# Patient Record
Sex: Female | Born: 1965
Health system: Southern US, Community
[De-identification: ages and names within clinical notes are randomized; demographics above are authoritative.]

## PROBLEM LIST (undated history)

## (undated) DIAGNOSIS — R109 Unspecified abdominal pain: Secondary | ICD-10-CM

## (undated) DIAGNOSIS — K449 Diaphragmatic hernia without obstruction or gangrene: Secondary | ICD-10-CM

## (undated) DIAGNOSIS — C20 Malignant neoplasm of rectum: Secondary | ICD-10-CM

## (undated) DIAGNOSIS — D509 Iron deficiency anemia, unspecified: Secondary | ICD-10-CM

## (undated) DIAGNOSIS — Z973 Presence of spectacles and contact lenses: Secondary | ICD-10-CM

## (undated) DIAGNOSIS — T8859XA Other complications of anesthesia, initial encounter: Secondary | ICD-10-CM

## (undated) DIAGNOSIS — Z803 Family history of malignant neoplasm of breast: Secondary | ICD-10-CM

## (undated) DIAGNOSIS — Z8489 Family history of other specified conditions: Secondary | ICD-10-CM

## (undated) DIAGNOSIS — E669 Obesity, unspecified: Secondary | ICD-10-CM

## (undated) DIAGNOSIS — Z8719 Personal history of other diseases of the digestive system: Secondary | ICD-10-CM

## (undated) DIAGNOSIS — K529 Noninfective gastroenteritis and colitis, unspecified: Secondary | ICD-10-CM

## (undated) DIAGNOSIS — K589 Irritable bowel syndrome without diarrhea: Secondary | ICD-10-CM

## (undated) DIAGNOSIS — R0600 Dyspnea, unspecified: Secondary | ICD-10-CM

## (undated) DIAGNOSIS — Z8739 Personal history of other diseases of the musculoskeletal system and connective tissue: Secondary | ICD-10-CM

## (undated) DIAGNOSIS — Z923 Personal history of irradiation: Secondary | ICD-10-CM

## (undated) DIAGNOSIS — N824 Other female intestinal-genital tract fistulae: Secondary | ICD-10-CM

## (undated) DIAGNOSIS — Z9221 Personal history of antineoplastic chemotherapy: Secondary | ICD-10-CM

## (undated) DIAGNOSIS — Z808 Family history of malignant neoplasm of other organs or systems: Secondary | ICD-10-CM

## (undated) DIAGNOSIS — G620 Drug-induced polyneuropathy: Secondary | ICD-10-CM

## (undated) DIAGNOSIS — G4730 Sleep apnea, unspecified: Secondary | ICD-10-CM

## (undated) DIAGNOSIS — Z9889 Other specified postprocedural states: Secondary | ICD-10-CM

## (undated) DIAGNOSIS — Z95828 Presence of other vascular implants and grafts: Secondary | ICD-10-CM

## (undated) DIAGNOSIS — F419 Anxiety disorder, unspecified: Secondary | ICD-10-CM

## (undated) DIAGNOSIS — M1990 Unspecified osteoarthritis, unspecified site: Secondary | ICD-10-CM

## (undated) DIAGNOSIS — E039 Hypothyroidism, unspecified: Secondary | ICD-10-CM

## (undated) DIAGNOSIS — Z8679 Personal history of other diseases of the circulatory system: Secondary | ICD-10-CM

## (undated) DIAGNOSIS — M75102 Unspecified rotator cuff tear or rupture of left shoulder, not specified as traumatic: Secondary | ICD-10-CM

## (undated) DIAGNOSIS — R7303 Prediabetes: Secondary | ICD-10-CM

## (undated) DIAGNOSIS — F32A Depression, unspecified: Secondary | ICD-10-CM

## (undated) DIAGNOSIS — E7800 Pure hypercholesterolemia, unspecified: Secondary | ICD-10-CM

## (undated) DIAGNOSIS — T451X5A Adverse effect of antineoplastic and immunosuppressive drugs, initial encounter: Secondary | ICD-10-CM

## (undated) DIAGNOSIS — Z800 Family history of malignant neoplasm of digestive organs: Secondary | ICD-10-CM

## (undated) DIAGNOSIS — I10 Essential (primary) hypertension: Secondary | ICD-10-CM

## (undated) DIAGNOSIS — T4145XA Adverse effect of unspecified anesthetic, initial encounter: Secondary | ICD-10-CM

## (undated) DIAGNOSIS — E559 Vitamin D deficiency, unspecified: Secondary | ICD-10-CM

## (undated) DIAGNOSIS — F329 Major depressive disorder, single episode, unspecified: Secondary | ICD-10-CM

## (undated) DIAGNOSIS — K219 Gastro-esophageal reflux disease without esophagitis: Secondary | ICD-10-CM

## (undated) DIAGNOSIS — Z87898 Personal history of other specified conditions: Secondary | ICD-10-CM

## (undated) DIAGNOSIS — R112 Nausea with vomiting, unspecified: Secondary | ICD-10-CM

## (undated) DIAGNOSIS — Z933 Colostomy status: Secondary | ICD-10-CM

## (undated) DIAGNOSIS — N189 Chronic kidney disease, unspecified: Secondary | ICD-10-CM

## (undated) DIAGNOSIS — K9189 Other postprocedural complications and disorders of digestive system: Secondary | ICD-10-CM

## (undated) HISTORY — DX: Family history of malignant neoplasm of other organs or systems: Z80.8

## (undated) HISTORY — PX: TONSILLECTOMY: SUR1361

## (undated) HISTORY — DX: Family history of malignant neoplasm of breast: Z80.3

## (undated) HISTORY — PX: DIAGNOSTIC LAPAROSCOPY: SUR761

## (undated) HISTORY — DX: Depression, unspecified: F32.A

## (undated) HISTORY — DX: Essential (primary) hypertension: I10

## (undated) HISTORY — PX: KNEE ARTHROSCOPY: SUR90

## (undated) HISTORY — DX: Vitamin D deficiency, unspecified: E55.9

## (undated) HISTORY — DX: Irritable bowel syndrome, unspecified: K58.9

## (undated) HISTORY — PX: COMBINED HYSTEROSCOPY DIAGNOSTIC / D&C: SUR297

## (undated) HISTORY — DX: Sleep apnea, unspecified: G47.30

## (undated) HISTORY — DX: Family history of malignant neoplasm of digestive organs: Z80.0

## (undated) HISTORY — DX: Pure hypercholesterolemia, unspecified: E78.00

## (undated) HISTORY — DX: Obesity, unspecified: E66.9

## (undated) HISTORY — DX: Major depressive disorder, single episode, unspecified: F32.9

---

## 1994-07-15 HISTORY — PX: ABDOMINAL HYSTERECTOMY: SHX81

## 1998-03-31 ENCOUNTER — Ambulatory Visit (HOSPITAL_COMMUNITY): Admission: RE | Admit: 1998-03-31 | Discharge: 1998-03-31 | Payer: Self-pay | Admitting: *Deleted

## 1998-03-31 ENCOUNTER — Encounter: Payer: Self-pay | Admitting: *Deleted

## 1998-06-30 ENCOUNTER — Ambulatory Visit (HOSPITAL_COMMUNITY): Admission: RE | Admit: 1998-06-30 | Discharge: 1998-06-30 | Payer: Self-pay | Admitting: Gastroenterology

## 2002-05-17 ENCOUNTER — Encounter: Payer: Self-pay | Admitting: *Deleted

## 2002-05-17 ENCOUNTER — Ambulatory Visit (HOSPITAL_COMMUNITY): Admission: RE | Admit: 2002-05-17 | Discharge: 2002-05-17 | Payer: Self-pay | Admitting: *Deleted

## 2002-09-21 ENCOUNTER — Other Ambulatory Visit: Admission: RE | Admit: 2002-09-21 | Discharge: 2002-09-21 | Payer: Self-pay | Admitting: Obstetrics and Gynecology

## 2004-07-15 HISTORY — PX: ROTATOR CUFF REPAIR: SHX139

## 2004-07-30 ENCOUNTER — Encounter: Admission: RE | Admit: 2004-07-30 | Discharge: 2004-07-30 | Payer: Self-pay | Admitting: Chiropractic Medicine

## 2005-06-20 ENCOUNTER — Other Ambulatory Visit: Admission: RE | Admit: 2005-06-20 | Discharge: 2005-06-20 | Payer: Self-pay | Admitting: Obstetrics and Gynecology

## 2009-08-28 ENCOUNTER — Encounter: Admission: RE | Admit: 2009-08-28 | Discharge: 2009-08-28 | Payer: Self-pay | Admitting: Sports Medicine

## 2009-09-14 ENCOUNTER — Ambulatory Visit (HOSPITAL_BASED_OUTPATIENT_CLINIC_OR_DEPARTMENT_OTHER): Admission: RE | Admit: 2009-09-14 | Discharge: 2009-09-14 | Payer: Self-pay | Admitting: Orthopedic Surgery

## 2009-09-14 HISTORY — PX: KNEE ARTHROSCOPY W/ MENISCECTOMY: SHX1879

## 2010-10-08 LAB — CBC
HCT: 36.4 % (ref 36.0–46.0)
Hemoglobin: 12.3 g/dL (ref 12.0–15.0)
MCHC: 33.7 g/dL (ref 30.0–36.0)
MCV: 86.7 fL (ref 78.0–100.0)
Platelets: 216 10*3/uL (ref 150–400)
RBC: 4.2 MIL/uL (ref 3.87–5.11)
RDW: 13.5 % (ref 11.5–15.5)
WBC: 9.9 10*3/uL (ref 4.0–10.5)

## 2010-10-08 LAB — BASIC METABOLIC PANEL
BUN: 9 mg/dL (ref 6–23)
CO2: 28 mEq/L (ref 19–32)
Calcium: 9.3 mg/dL (ref 8.4–10.5)
Chloride: 101 mEq/L (ref 96–112)
Creatinine, Ser: 0.65 mg/dL (ref 0.4–1.2)
GFR calc Af Amer: >60 mL/min (ref >60)
GFR calc non Af Amer: >60 mL/min (ref >60)
Glucose, Bld: 95 mg/dL (ref 70–99)
Potassium: 4.1 mEq/L (ref 3.5–5.1)
Sodium: 137 mEq/L (ref 135–145)

## 2012-02-28 ENCOUNTER — Other Ambulatory Visit: Payer: Self-pay | Admitting: Orthopedic Surgery

## 2012-02-28 DIAGNOSIS — M545 Low back pain, unspecified: Secondary | ICD-10-CM

## 2012-03-02 ENCOUNTER — Inpatient Hospital Stay: Admission: RE | Admit: 2012-03-02 | Payer: Self-pay | Source: Ambulatory Visit

## 2012-03-06 ENCOUNTER — Ambulatory Visit
Admission: RE | Admit: 2012-03-06 | Discharge: 2012-03-06 | Disposition: A | Discharge status: Home / Self Care | Payer: BC Managed Care – PPO | Source: Ambulatory Visit | Attending: Orthopedic Surgery | Admitting: Orthopedic Surgery

## 2012-03-06 DIAGNOSIS — M545 Low back pain, unspecified: Secondary | ICD-10-CM

## 2013-02-12 ENCOUNTER — Other Ambulatory Visit: Payer: Self-pay | Admitting: Family Medicine

## 2013-02-12 ENCOUNTER — Ambulatory Visit
Admission: RE | Admit: 2013-02-12 | Discharge: 2013-02-12 | Disposition: A | Discharge status: Home / Self Care | Payer: BC Managed Care – PPO | Source: Ambulatory Visit | Attending: Family Medicine | Admitting: Family Medicine

## 2013-02-12 DIAGNOSIS — R52 Pain, unspecified: Secondary | ICD-10-CM

## 2013-09-06 ENCOUNTER — Other Ambulatory Visit: Payer: Self-pay | Admitting: Gastroenterology

## 2013-09-06 DIAGNOSIS — R110 Nausea: Secondary | ICD-10-CM

## 2013-09-17 ENCOUNTER — Ambulatory Visit
Admission: RE | Admit: 2013-09-17 | Discharge: 2013-09-17 | Disposition: A | Discharge status: Home / Self Care | Payer: BC Managed Care – PPO | Source: Ambulatory Visit | Attending: Gastroenterology | Admitting: Gastroenterology

## 2013-09-17 DIAGNOSIS — R110 Nausea: Secondary | ICD-10-CM

## 2013-09-28 ENCOUNTER — Other Ambulatory Visit: Payer: Self-pay | Admitting: Gastroenterology

## 2013-09-28 DIAGNOSIS — R1011 Right upper quadrant pain: Secondary | ICD-10-CM

## 2013-10-06 ENCOUNTER — Ambulatory Visit
Admission: RE | Admit: 2013-10-06 | Discharge: 2013-10-06 | Disposition: A | Discharge status: Home / Self Care | Payer: BC Managed Care – PPO | Source: Ambulatory Visit | Attending: Gastroenterology | Admitting: Gastroenterology

## 2013-10-06 DIAGNOSIS — R1011 Right upper quadrant pain: Secondary | ICD-10-CM

## 2013-10-06 MED ORDER — GADOBENATE DIMEGLUMINE 529 MG/ML IV SOLN
20.0000 mL | Freq: Once | INTRAVENOUS | Status: AC | PRN
  Administered 2013-10-06: 20 mL via INTRAVENOUS

## 2013-10-08 ENCOUNTER — Other Ambulatory Visit (HOSPITAL_COMMUNITY): Payer: Self-pay | Admitting: Gastroenterology

## 2013-10-08 DIAGNOSIS — R110 Nausea: Secondary | ICD-10-CM

## 2013-10-15 ENCOUNTER — Ambulatory Visit (HOSPITAL_COMMUNITY): Payer: BC Managed Care – PPO

## 2013-10-25 ENCOUNTER — Ambulatory Visit (HOSPITAL_COMMUNITY): Payer: BC Managed Care – PPO

## 2013-11-16 ENCOUNTER — Encounter: Payer: Self-pay | Admitting: Cardiology

## 2014-01-05 ENCOUNTER — Ambulatory Visit: Payer: BC Managed Care – PPO | Admitting: Cardiology

## 2014-01-12 ENCOUNTER — Ambulatory Visit: Payer: BC Managed Care – PPO | Admitting: Cardiology

## 2014-01-19 ENCOUNTER — Ambulatory Visit: Payer: BC Managed Care – PPO | Admitting: Cardiology

## 2014-02-02 ENCOUNTER — Encounter: Payer: Self-pay | Admitting: *Deleted

## 2014-02-21 ENCOUNTER — Encounter: Payer: Self-pay | Admitting: Cardiology

## 2014-02-21 ENCOUNTER — Ambulatory Visit (INDEPENDENT_AMBULATORY_CARE_PROVIDER_SITE_OTHER): Payer: BC Managed Care – PPO | Admitting: Cardiology

## 2014-02-21 VITALS — BP 139/88 | HR 68 | Ht 70.5 in | Wt 359.0 lb

## 2014-02-21 DIAGNOSIS — E7800 Pure hypercholesterolemia, unspecified: Secondary | ICD-10-CM

## 2014-02-21 DIAGNOSIS — Z8249 Family history of ischemic heart disease and other diseases of the circulatory system: Secondary | ICD-10-CM

## 2014-02-21 DIAGNOSIS — E66813 Obesity, class 3: Secondary | ICD-10-CM | POA: Insufficient documentation

## 2014-02-21 NOTE — Progress Notes (Signed)
[place]. [address], [address], [place]  [address] Phone: [phone] Fax:  [phone]  Date:  02/21/2014   ID:  Lindsay Little, DOB 1965/08/20, MRN [id number]  PCP:  Vidal Schwalbe, MD   History of Present Illness: Lindsay Little is a 48 y.o. 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 Leonia Reeves. in regards to heart disease, her father died at age 34 because of coronary artery disease likely secondary to familial hyperlipidemia.  Her mother also had a 95% lesion that was stented by Dr. Irish Lack. She has 2 children, husband, and she is concerned about her overall cardiovascular health.  Studies:  ETT 03/17/13 - 5 minutes, sinus tachycardia at rest. No ST segment changes. No chest pain, rare PVC. Blood pressure 156/96 at rest, 220/78 at stress. Impression: Overall low risk with no electrocardiographic evidence of ischemia however poor exercise tolerance noted as well as hypertension.   Records show a former echocardiogram in 2003 showing normal function. Lots of stress at home and at work, perimenopausal. 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 She works side-by-side with Dr. Maxwell Caul.  She used to work out 3 times a week with a partner at church but recently hurt her left knee and she has not been going. She has lost as much as 70 pounds in the past but has gained back approximately 30. She is taking Cymbalta for perimenopausal symptoms. No SOB.   Dizzy when standing has improved. She has been on Bystolic 5 mg for quite some time and is also taking furosemide. Last 6 months worse.     Wt Readings from Last 3 Encounters:  02/21/14 359 lb (162.841 kg)     Past Medical History  Diagnosis Date  . Hypertension   . Hypercholesteremia   .  Obesity   . Tear of left rotator cuff   . IBS (irritable bowel syndrome)   . Vertigo   . TMJ (dislocation of temporomandibular joint)   . Bulging disc     L5  . Mild sleep apnea   . Depression   . Rectal bleeding   . Vitamin D deficiency   . Osteoarthritis, knee     Past Surgical History  Procedure Laterality Date  . Rotator cuff repair      Current Outpatient Prescriptions  Medication Sig Dispense Refill  . belladona alk-PHENObarbital (DONNATAL) 16.2 MG tablet Take 1 tablet by mouth as needed.      . Coenzyme Q10 (COQ-10) 100 MG CAPS Take 100 mg by mouth daily.      . DULoxetine (CYMBALTA) 60 MG capsule Take 60 mg by mouth daily.      Marland Kitchen esomeprazole (NEXIUM) 40 MG capsule Take 40 mg by mouth 2 (two) times daily.      . furosemide (LASIX) 20 MG tablet Take 20 mg by mouth as needed.      Marland Kitchen HYDROcodone-acetaminophen (NORCO/VICODIN) 5-325 MG per tablet Take 1 tablet by mouth as needed for moderate pain.      . hyoscyamine (LEVBID) 0.375 MG 12 hr tablet Take 0.375 mg by mouth 2 (two) times daily.      . meloxicam (MOBIC) 15 MG tablet Take 15 mg by mouth daily.      . methocarbamol (ROBAXIN) 500 MG  tablet Take 500 mg by mouth as needed for muscle spasms.      . nebivolol (BYSTOLIC) 5 MG tablet Take 5 mg by mouth daily.      . rosuvastatin (CRESTOR) 5 MG tablet Take 5 mg by mouth daily.      . Vitamin D, Ergocalciferol, (DRISDOL) 50000 UNITS CAPS capsule Take 50,000 Units by mouth 2 (two) times a week.       No current facility-administered medications for this visit.    Allergies:    Allergies  Allergen Reactions  . Caine-1 [Lidocaine] Rash  . Penicillins Rash  . Sulfa Antibiotics Rash    Rash & Vomiting    Social History:  The patient  reports that she drinks about .6 ounces of alcohol per week.   Family History  Problem Relation Age of Onset  . Coronary artery disease Mother 34  . Hypertension Mother   . Hyperlipidemia Mother   . Diabetes Mellitus I Mother   .  Coronary artery disease Father   . Hyperlipidemia Father   . Hypertension Father   . Cancer Sister     skin - non melanoma  . Hyperlipidemia Brother   . Cancer Maternal Uncle     pancreatic, colo, prostate  . Cirrhosis Maternal Uncle   . Hypertension Maternal Grandmother   . Diabetes Mellitus I Maternal Grandmother   . Hyperlipidemia Maternal Grandmother   . CVA Maternal Grandmother   . Hypertension Maternal Grandfather   . Coronary artery disease Maternal Grandfather 65  . Hyperlipidemia Maternal Grandfather   . Coronary artery disease Paternal Grandmother   . Hypertension Paternal Grandmother   . Hyperlipidemia Paternal Grandmother   . Diabetes Mellitus I Paternal Grandmother   . Hypertension Paternal Grandfather   . Hyperlipidemia Paternal Grandfather   . Coronary artery disease Paternal Grandfather     ROS:  Please see the history of present illness.   Denies any syncope, bleeding, orthopnea, PND   Left knee.34'. All other systems reviewed and negative.   PHYSICAL EXAM: VS:  BP 139/88  Pulse 68  Ht 5' 10.5" (1.791 m)  Wt 359 lb (162.841 kg)  BMI 50.77 kg/m2 Well nourished, well developed, in no acute distress HEENT: normal, [place]/AT, EOMI Neck: no JVD, normal carotid upstroke, no bruit Cardiac:  normal S1, S2; RRR; no murmur Lungs:  clear to auscultation bilaterally, no wheezing, rhonchi or rales Abd: soft, nontender, no hepatomegaly, no bruits Ext: no edema, 2+ distal pulses Skin: warm and dry GU: deferred Neuro: no focal abnormalities noted, AAO x 3  EKG:  02/21/14  -sinus rhythm, 68, no other significant abnormalities. No change prior   Labs: 09/16/13-LDL 146  ASSESSMENT AND PLAN:  1. Family history of early coronary artery disease-excellent use of Crestor, now taking on a daily basis. Previously taking twice a week. I would love LDL to be around 100. Less than 130 would be wonderful. 2. Morbid obesity-continue to encourage weight loss. Her left knee pain has  prohibited this. Her husband, chronic pain, is going to undergo bariatric surgery. Discussed decreasing carbohydrates. Weight loss. 3. Hyperlipidemia-as described above. LDL 146. Crestor. 4. One year followup.  Signed, Candee Furbish, MD [facility]  02/21/2014 9:08 AM

## 2014-02-21 NOTE — Patient Instructions (Signed)
The current medical regimen is effective;  continue present plan and medications.  Follow up in 1 year with Dr Marlou Porch.  You will receive a letter in the mail 2 months before you are due.  Please call us when you receive this letter to schedule your follow up appointment.

## 2014-04-13 ENCOUNTER — Ambulatory Visit: Payer: BC Managed Care – PPO | Admitting: *Deleted

## 2014-04-18 ENCOUNTER — Encounter: Payer: BC Managed Care – PPO | Attending: Family Medicine | Admitting: *Deleted

## 2014-04-18 ENCOUNTER — Encounter: Payer: Self-pay | Admitting: *Deleted

## 2014-04-18 DIAGNOSIS — Z713 Dietary counseling and surveillance: Secondary | ICD-10-CM | POA: Insufficient documentation

## 2014-04-18 DIAGNOSIS — Z6841 Body Mass Index (BMI) 40.0 and over, adult: Secondary | ICD-10-CM | POA: Diagnosis not present

## 2014-04-18 NOTE — Patient Instructions (Signed)
1. 1-2 pounds weight loss per week.  2. Balance meals using the plate method.  3. Keep healthy foods available in the house for easy meal prep (frozen vegetables, lean deli meat, fruit, yogurt). Keep healthy snacks at work.  4. Limit sweetened drinks to 1 per day only on work days. Consider doing half sweet/half unsweet tea and/or using Stevia/Splenda to sweeten.  5. Exercise as physically able to, using FitBit to increase total steps throughout the day

## 2014-04-18 NOTE — Progress Notes (Signed)
Medical Nutrition Therapy:  Appt start time: 0800 end time:  0900.  Assessment:  Patient here today for weight management. Patient has struggled with weight most of her life. She had lost 70 pounds 1.5 years ago by monitoring portion size and exercising (boot camp) most days. After developing knee problems, she has been unable to exercise much. She has since regained most of the weight. She plans on having a knee replacement, but wants to wait as long as possibly due to having to care for disabled husband. Physical activity will be limited until this is resolved. She does have a FitBit and averages between 3,000-10,000 depending on the day. Her dietary intake is variable due to time constraints. She eats out most days at lunch, and dinner sometimes consists of high fat meats, starches depending on who is cooking. She also has a high intake of sweetened drinks, consuming 2-3 20 oz sodas/sweet teas daily.   MEDICATIONS: See list   DIETARY INTAKE:   Usual eating pattern includes 3 meals and 1 snacks per day.  24-hr recall:  B (9 AM): Sometimes skips/eats late, egg salad/banana sandwich OR oatmeal, water/soda Snk ( AM): Non  L ( PM): Salad OR [employer] (sandwich, fries), OR [place] food (fajitas, tortilla chips)sweet tea Snk ( PM): Chex mix, Cheez its, popcorn, small chocolate bar D ( PM): Hamburger/hot dog, fries/tater tots/mashed potatoes/corn, broccoli/green beans/peas/Brussels sprouts, sugar-free Kool-aid Snk ( PM): None Beverages: Water, Up to 2 regular 20 oz sodas, sweet tea (20 oz), sugar-free Kool-aid  Usual physical activity: Limited, 3,000-10,000 steps daily  Estimated energy needs: 1500 calories 188 g carbohydrates 94 g protein 42 g fat  Progress Towards Goal(s):  In progress.   Nutritional Diagnosis:  [place]-3.3 Overweight/obesity As related to excessive intake of sweetened drinks and physical inactivity.  As evidenced by BMI >30.    Intervention:  Nutrition counseling. We discussed  strategies for weight loss, including balancing nutrients (carbs, protein, fat), portion control, healthy snacks, and exercise. We also discussed realistic weight loss expectations and strategies for achieving and maintaining long-term weight loss.   Goals:  1. 1-2 pounds weight loss per week.  2. Balance meals using the plate method.  3. Keep healthy foods available in the house for easy meal prep (frozen vegetables, lean deli meat, fruit, yogurt). Keep healthy snacks at work.  4. Limit sweetened drinks to 1 per day only on work days. Consider doing half sweet/half unsweet tea and/or using Stevia/Splenda to sweeten.  5. Exercise as physically able to, using FitBit to increase total steps throughout the day  Handouts given during visit include:  Weight loss tips  Meal plan card  Monitoring/Evaluation:  Dietary intake, exercise, and body weight prn.

## 2014-12-06 ENCOUNTER — Other Ambulatory Visit (HOSPITAL_COMMUNITY): Payer: Self-pay | Admitting: Gastroenterology

## 2014-12-06 DIAGNOSIS — R109 Unspecified abdominal pain: Secondary | ICD-10-CM

## 2014-12-15 ENCOUNTER — Ambulatory Visit (HOSPITAL_COMMUNITY)
Admission: RE | Admit: 2014-12-15 | Discharge: 2014-12-15 | Disposition: A | Discharge status: Home / Self Care | Payer: BLUE CROSS/BLUE SHIELD | Source: Ambulatory Visit | Attending: Gastroenterology | Admitting: Gastroenterology

## 2014-12-15 DIAGNOSIS — E669 Obesity, unspecified: Secondary | ICD-10-CM | POA: Insufficient documentation

## 2014-12-15 DIAGNOSIS — E780 Pure hypercholesterolemia: Secondary | ICD-10-CM | POA: Diagnosis not present

## 2014-12-15 DIAGNOSIS — K589 Irritable bowel syndrome without diarrhea: Secondary | ICD-10-CM | POA: Insufficient documentation

## 2014-12-15 DIAGNOSIS — R109 Unspecified abdominal pain: Secondary | ICD-10-CM | POA: Diagnosis not present

## 2014-12-15 MED ORDER — TECHNETIUM TC 99M MEBROFENIN IV KIT
5.0000 | PACK | Freq: Once | INTRAVENOUS | Status: AC | PRN
  Administered 2014-12-15: 5 via INTRAVENOUS

## 2014-12-15 MED ORDER — SINCALIDE 5 MCG IJ SOLR
INTRAMUSCULAR | Status: AC
  Administered 2014-12-15: 7.06 ug
  Filled 2014-12-15: qty 20

## 2014-12-15 MED ORDER — STERILE WATER FOR INJECTION IJ SOLN
INTRAMUSCULAR | Status: AC
  Filled 2014-12-15: qty 20

## 2015-01-13 ENCOUNTER — Other Ambulatory Visit: Payer: Self-pay | Admitting: Gastroenterology

## 2015-01-17 ENCOUNTER — Other Ambulatory Visit: Payer: Self-pay | Admitting: Gastroenterology

## 2015-01-17 DIAGNOSIS — R1084 Generalized abdominal pain: Secondary | ICD-10-CM

## 2015-01-18 ENCOUNTER — Ambulatory Visit (HOSPITAL_COMMUNITY)
Admission: RE | Admit: 2015-01-18 | Discharge: 2015-01-18 | Disposition: A | Discharge status: Home / Self Care | Payer: BLUE CROSS/BLUE SHIELD | Source: Ambulatory Visit | Attending: Gastroenterology | Admitting: Gastroenterology

## 2015-01-18 ENCOUNTER — Encounter (HOSPITAL_COMMUNITY): Payer: Self-pay | Admitting: *Deleted

## 2015-01-18 ENCOUNTER — Encounter (HOSPITAL_COMMUNITY)
Admission: RE | Disposition: A | Discharge status: Home / Self Care | Payer: Self-pay | Source: Ambulatory Visit | Attending: Gastroenterology

## 2015-01-18 DIAGNOSIS — C19 Malignant neoplasm of rectosigmoid junction: Secondary | ICD-10-CM | POA: Insufficient documentation

## 2015-01-18 DIAGNOSIS — C20 Malignant neoplasm of rectum: Secondary | ICD-10-CM | POA: Diagnosis present

## 2015-01-18 HISTORY — PX: EUS: SHX5427

## 2015-01-18 SURGERY — ULTRASOUND, LOWER GI TRACT, ENDOSCOPIC
Anesthesia: Moderate Sedation

## 2015-01-18 MED ORDER — FENTANYL CITRATE (PF) 100 MCG/2ML IJ SOLN
INTRAMUSCULAR | Status: AC
  Filled 2015-01-18: qty 2

## 2015-01-18 MED ORDER — MIDAZOLAM HCL 10 MG/2ML IJ SOLN
INTRAMUSCULAR | Status: DC | PRN
Start: 2015-01-18 — End: 2015-01-18
  Administered 2015-01-18 (×2): 2 mg via INTRAVENOUS

## 2015-01-18 MED ORDER — FENTANYL CITRATE (PF) 100 MCG/2ML IJ SOLN
INTRAMUSCULAR | Status: DC | PRN
  Administered 2015-01-18 (×2): 25 ug via INTRAVENOUS

## 2015-01-18 MED ORDER — MIDAZOLAM HCL 5 MG/ML IJ SOLN
INTRAMUSCULAR | Status: AC
  Filled 2015-01-18: qty 2

## 2015-01-18 MED ORDER — DIPHENHYDRAMINE HCL 50 MG/ML IJ SOLN
INTRAMUSCULAR | Status: AC
  Filled 2015-01-18: qty 1

## 2015-01-18 NOTE — Op Note (Signed)
[facility] [place], [address]   OPERATIVE PROCEDURE REPORT  PATIENT: Lindsay Little, Lindsay Little  MR#: [id number] BIRTHDATE: 12/11/1965  GENDER: female ENDOSCOPIST: Arta Silence, MD REFERRED BY:  Teena Irani, M.D. PROCEDURE DATE:  01/18/2015 PROCEDURE:   Flexible sigmoidoscopy EUS ASA CLASS:   Class III INDICATIONS:1.  rectal cancer. MEDICATIONS: Fentanyl 50 mcg IV and Versed 4 mg IV  DESCRIPTION OF PROCEDURE:   After the risks benefits and alternatives of the procedure were thoroughly explained, informed consent was obtained.  Throughout the procedure, the patients blood pressure, pulse and oxygen saturations were monitored continuously. Under direct visualization, the radial forward-viewing  echoendoscope was introduced through the anus  and advanced to the sigmoid colon .  Water was used as necessary to provide an acoustic interface.  Imaging was obtained at 7.5 and 12Mhz. Upon completion of the imaging, water was removed and the patient was sent to the recovery room in satisfactory condition. Estimated blood loss is zero unless otherwise noted in this procedure report.    FINDINGS:   Normal digital rectal exam; could not palpate mass.  In proximal rectum extending to the distal rectum, from 12 cm to 20 cm from the anal verge, a partial circumferential (50-75% circumferential distally, 75-100% circumferential proximall) mass was seen.  Mass was firm, fixed, ulcerated, friable.  EUS radial scope was able to traverse the tumor into normal-appearing distal sigmoid colon.  Lesion invaded through the wall of the rectum into neighboring connective tissues in multiple regions.  There were several round hypoechoic well-defined malignant-appearing lymph nodes in the vicinity of the proximal portion of the tumor.  STAGING: T3 N2 Mx via rectal ultrasound  ENDOSCOPIC IMPRESSION: As above.  Rectosigmoid mass, biopsy-proven adenocarcinoma, with local  invasion noted under ultrasound.  RECOMMENDATIONS: 1.  Watch for potential complications of procedure. 2.  Needs CT chest, abdomen, pelvis to complete staging. 3.  Based already on rectal ultrasound results, patient would not be candidate for upfront surgery.  She will need surgical and radiation oncology consultations. 4.  Will discuss with Dr. Amedeo Plenty.   _______________________________ Lorrin MaisArta Silence, MD 01/18/2015 9:34 AM   CC:

## 2015-01-18 NOTE — H&P (Signed)
Patient interval history reviewed.  Patient examined again.  There has been no change from documented H/P dated 01/13/15 (scanned into chart from our office) except as documented above.  Assessment:  1.  Hematochezia. 2.  Rectal mass.  Plan:  1.  Endorectal ultrasound for locoregional staging. 2.  Risks (bleeding, infection, bowel perforation that could require surgery, sedation-related changes in cardiopulmonary systems), benefits (identification and possible treatment of source of symptoms, exclusion of certain causes of symptoms), and alternatives (watchful waiting, radiographic imaging studies, empiric medical treatment) of sigmoidoscopy with ultrasound (endorectal ultrasound = RUS) were explained to patient/family in detail and patient wishes to proceed.

## 2015-01-18 NOTE — Discharge Instructions (Signed)
Endorectal ultrasound  Post procedure instructions:  Read the instructions outlined below and refer to this sheet in the next few weeks. These discharge instructions provide you with general information on caring for yourself after you leave the hospital. Your doctor may also give you specific instructions. While your treatment has been planned according to the most current medical practices available, unavoidable complications occasionally occur. If you have any problems or questions after discharge, call Dr. Paulita Fujita at [facility] [phone]).  HOME CARE INSTRUCTIONS  ACTIVITY:  You may resume your regular activity, but move at a slower pace for the next 24 hours.   Take frequent rest periods for the next 24 hours.   Walking will help get rid of the air and reduce the bloated feeling in your belly (abdomen).   No driving for 24 hours (because of the medicine (anesthesia) used during the test).   You may shower.   Do not sign any important legal documents or operate any machinery for 24 hours (because of the anesthesia used during the test).  NUTRITION:  Drink plenty of fluids.   You may resume your normal diet as instructed by your doctor.   Begin with a light meal and progress to your normal diet. Heavy or fried foods are harder to digest and may make you feel sick to your stomach (nauseated).   Avoid alcoholic beverages for 24 hours or as instructed.  MEDICATIONS:  You may resume your normal medications unless your doctor tells you otherwise.  WHAT TO EXPECT TODAY:  Some feelings of bloating in the abdomen.   Passage of more gas than usual.   Spotting of blood in your stool or on the toilet paper.  IF YOU HAD POLYPS REMOVED DURING THE COLONOSCOPY:  No aspirin products for 7 days or as instructed.   No alcohol for 7 days or as instructed.   Eat a soft diet for the next 24 hours.   FINDING OUT THE RESULTS OF YOUR TEST  Not all test results are available  during your visit. If your test results are not back during the visit, make an appointment with your caregiver to find out the results. Do not assume everything is normal if you have not heard from your caregiver or the medical facility. It is important for you to follow up on all of your test results.     SEEK IMMEDIATE MEDICAL CARE IF:   You have more than a spotting of blood in your stool.   Your belly is swollen (abdominal distention).   You are nauseated or vomiting.   You have a fever.   You have abdominal pain or discomfort that is severe or gets worse throughout the day.    Document Released: 02/13/2004 Document Revised: 03/13/2011 Document Reviewed: 02/11/2008 [facility] Patient Information 2012 [place].

## 2015-01-18 NOTE — Addendum Note (Signed)
Addended by: Arta Silence on: 01/18/2015 08:41 AM   Modules accepted: Orders

## 2015-01-19 ENCOUNTER — Encounter (HOSPITAL_COMMUNITY): Payer: Self-pay | Admitting: Gastroenterology

## 2015-01-20 ENCOUNTER — Other Ambulatory Visit: Payer: BLUE CROSS/BLUE SHIELD

## 2015-01-20 ENCOUNTER — Ambulatory Visit
Admission: RE | Admit: 2015-01-20 | Discharge: 2015-01-20 | Disposition: A | Discharge status: Home / Self Care | Payer: BLUE CROSS/BLUE SHIELD | Source: Ambulatory Visit | Attending: Gastroenterology | Admitting: Gastroenterology

## 2015-01-20 DIAGNOSIS — R1084 Generalized abdominal pain: Secondary | ICD-10-CM

## 2015-01-20 MED ORDER — IOPAMIDOL (ISOVUE-300) INJECTION 61%
125.0000 mL | Freq: Once | INTRAVENOUS | Status: AC | PRN
  Administered 2015-01-20: 125 mL via INTRAVENOUS

## 2015-01-23 ENCOUNTER — Telehealth: Payer: Self-pay | Admitting: Hematology

## 2015-01-23 NOTE — Telephone Encounter (Signed)
New patient appt-s/w patient and gave np appt for 07/19 @ 10:30 w/Dr. Burr Medico

## 2015-01-25 ENCOUNTER — Other Ambulatory Visit: Payer: Self-pay | Admitting: Gastroenterology

## 2015-01-25 DIAGNOSIS — D1800 Hemangioma unspecified site: Secondary | ICD-10-CM

## 2015-01-31 ENCOUNTER — Encounter: Payer: Self-pay | Admitting: Hematology

## 2015-01-31 ENCOUNTER — Ambulatory Visit: Payer: BLUE CROSS/BLUE SHIELD

## 2015-01-31 ENCOUNTER — Telehealth: Payer: Self-pay | Admitting: Hematology

## 2015-01-31 ENCOUNTER — Ambulatory Visit (HOSPITAL_BASED_OUTPATIENT_CLINIC_OR_DEPARTMENT_OTHER): Payer: BLUE CROSS/BLUE SHIELD | Admitting: Hematology

## 2015-01-31 VITALS — BP 143/79 | HR 68 | Temp 98.1°F | Resp 21 | Ht 71.0 in | Wt 346.8 lb

## 2015-01-31 DIAGNOSIS — D609 Acquired pure red cell aplasia, unspecified: Secondary | ICD-10-CM | POA: Diagnosis not present

## 2015-01-31 DIAGNOSIS — K769 Liver disease, unspecified: Secondary | ICD-10-CM

## 2015-01-31 DIAGNOSIS — C20 Malignant neoplasm of rectum: Secondary | ICD-10-CM

## 2015-01-31 DIAGNOSIS — R197 Diarrhea, unspecified: Secondary | ICD-10-CM

## 2015-01-31 NOTE — Progress Notes (Signed)
Checked in new pt with no financial concerns prior to seeing the dr.  Pt has my card for any billing questions, concerns or if financial assistance is needed.  ° °

## 2015-01-31 NOTE — Telephone Encounter (Signed)
per pof to sch pt appt-gav pt copy of avs-avd rad on/[facility] wiill call to sch scan/radaition appts

## 2015-01-31 NOTE — Progress Notes (Signed)
[place]  [phone] Fax:([phone]) [place] Note   Patient Care Team: [name], MD as PCP - General (Family Medicine) 01/31/2015  CHIEF COMPLAINTS/PURPOSE OF CONSULTATION:  Newly diagnosed rectal cancer   HISTORY OF PRESENTING ILLNESS:  Lindsay Little 49 y.o. female is here because of evaluation for management for newly diagnosed rectal adenocarcinoma.   On 09/06/13, she presented to Dr. Starr Sinclair [facility]] with 20-monthhistory of diarrhea and worsening abdominal pain. She has a history of chronic abdominal pain and had been [place] Dr. HAmedeo Plenty Abdominal [place]orea3/6/15 noted to have some hypoechoicity in the left hepatic lobe which was confirmed on MRI to be two hemangiomas  in the right hepatic lobe of measuring up to 3.0 cm along with an additional 7 mm probable cyst.   She presented with worsening of diarrhea for 6 month and was reevaluated by Dr. HAmedeo Plenty She was seen on 12/06/14 at which time it was decided she would undergo HIDA scan given that the pain was intermittently in the RUQ in addition to upper and lower endoscopy. HIDA scan 12/15/14 was reassuring, but she was found to have a malignant tumor of the rectum about 12-18 cm proximal to the anus, gastritis, gastric polyps on endoscopies 01/13/15. Biopsies for these three specimens along with the duodenum were collected and resulted on 01/17/15 for invasive adenocarcinoma, chronic gastritis [H. Pylori negative], fundic gland polyps, and non-specific mild patchy intra-epithelial lymphocytes. Endoscopic UKorea7/6/16 staged the mass at T3N2Mx and she is here today for consideration of neoadjuvant therapy.    Today, she does report abdominal pain but is no different than her baseline [right-sided, upper, lower] but has had worsening appetite over the last 2 months. She does have diarrhea with bowel  movements every 1-2 hours, especially after eating, and has been noticing bleeding with her bowel movement following her endoscopic evaluation. She also feels her energy level has decreased. Otherwise, she denies nausea, chest pain, shortness of breath.    Family history is notable for a maternal uncle who had pancreatic cancer diagnosed in his mid to late 668sthat spread to the colon and prostate before he died about 147years later and a sister with non-melanoma skin cancer. 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 She smoked roughly 20 pack years [1 pack/week x 7 years] in the past and reports occasional alcohol use but denies any prior illicit drug use.  She lost about 7 lbs in the past 2 months  MEDICAL HISTORY:  Past Medical History  Diagnosis Date  . Hypertension   . Hypercholesteremia   . Obesity   . Tear of left rotator cuff   . IBS (irritable bowel syndrome)   . Vertigo   . TMJ (dislocation of temporomandibular joint)   . Bulging disc     L5  . Mild sleep apnea   . Depression   . Rectal bleeding   . Vitamin D deficiency   . Osteoarthritis, knee     SURGICAL HISTORY: Past Surgical History  Procedure Laterality Date  . Rotator cuff repair    . Eus N/A 01/18/2015    Procedure: LOWER ENDOSCOPIC ULTRASOUND (EUS);  Surgeon: WArta Silence MD;  [facility];  Service: Endoscopy;  Laterality: N/A;  . Abdominal hysterectomy  1996    Removed  uterus, and tubes    SOCIAL HISTORY: History   Social History  . Marital Status: Married    Spouse Name: N/A  . Number of Children: N/A  . Years of Education: N/A   Occupational History  . Not on file.   Social History Main Topics  . Smoking status: Former Research scientist (life sciences)  . Smokeless tobacco: Not on file     Comment: 1 pack/week intermittently x 7 years  . Alcohol Use: 0.6 oz/week    1 Shots of liquor per week  . Drug Use: Not on file  . Sexual Activity: Not on file     Other Topics Concern  . Not on file   Social History Narrative   Tobacco Use: Cigarettes - Former Smoker   Alcohol: Yes, very rare, liquor.    No recreational drug use   Occupation: Head CMA @ [employer]   Marital Status: Married    Husband: Roselyn Reef Disabled   Children: 2 adopted kids [place]   Religion: [employer] [place] HISTORY: Family History  Problem Relation Age of Onset  . Coronary artery disease Mother 87  . Hypertension Mother   . Hyperlipidemia Mother   . Diabetes Mellitus I Mother   . Coronary artery disease Father   . Hyperlipidemia Father   . Hypertension Father   . Cancer Sister     skin - non melanoma  . Hyperlipidemia Brother   . Cancer Maternal Uncle 61    pancreatic with mets to colon and prostate  . Cirrhosis Maternal Uncle   . Hypertension Maternal Grandmother   . Diabetes Mellitus I Maternal Grandmother   . Hyperlipidemia Maternal Grandmother   . CVA Maternal Grandmother   . Hypertension Maternal Grandfather   . Coronary artery disease Maternal Grandfather 65  . Hyperlipidemia Maternal Grandfather   . Coronary artery disease Paternal Grandmother   . Hypertension Paternal Grandmother   . Hyperlipidemia Paternal Grandmother   . Diabetes Mellitus I Paternal Grandmother   . Hypertension Paternal Grandfather   . Hyperlipidemia Paternal Grandfather   . Coronary artery disease Paternal Grandfather     ALLERGIES:  is allergic to caine-1; penicillins; and sulfa antibiotics.  MEDICATIONS:  Current Outpatient Prescriptions  Medication Sig Dispense Refill  . belladona alk-PHENObarbital (DONNATAL) 16.2 MG tablet Take 1 tablet by mouth as needed.    . Coenzyme Q10 (COQ-10) 100 MG CAPS Take 100 mg by mouth daily.    . DULoxetine (CYMBALTA) 60 MG capsule Take 60 mg by mouth daily.    Marland Kitchen esomeprazole (NEXIUM) 40 MG capsule Take 40 mg by mouth 2 (two) times daily.    . furosemide (LASIX) 20 MG tablet Take  20 mg by mouth as needed.    . hydrochlorothiazide (HYDRODIURIL) 25 MG tablet Take 25 mg by mouth daily.    Marland Kitchen HYDROcodone-acetaminophen (NORCO/VICODIN) 5-325 MG per tablet Take 1 tablet by mouth as needed for moderate pain.    . hyoscyamine (LEVBID) 0.375 MG 12 hr tablet Take 0.375 mg by mouth 2 (two) times daily.    . meloxicam (MOBIC) 15 MG tablet Take 15 mg by mouth daily.    . methocarbamol (ROBAXIN) 500 MG tablet Take 500 mg by mouth as needed for muscle spasms.    . nebivolol (BYSTOLIC) 5 MG tablet Take 5 mg by mouth daily.    . rosuvastatin (CRESTOR) 5 MG tablet Take 5  mg by mouth daily.    . Vitamin D, Ergocalciferol, (DRISDOL) 50000 UNITS CAPS capsule Take 50,000 Units by mouth 2 (two) times a week.     No current facility-administered medications for this visit.    REVIEW OF SYSTEMS:   Constitutional: Denies fevers, chills or abnormal night sweats Eyes: Denies blurriness of vision, double vision or watery eyes Ears, nose, mouth, throat, and face: Denies mucositis or sore throat Respiratory: Denies cough, dyspnea or wheezes Cardiovascular: Denies palpitation, chest discomfort or lower extremity swelling Gastrointestinal:  Diarrhea, abdominal pain, hematochezia Skin: Denies abnormal skin rashes Lymphatics: Denies new lymphadenopathy or easy bruising Neurological:Denies numbness, tingling or new weaknesses Behavioral/Psych: Mood is stable, no new changes  All other systems were reviewed with the patient and are negative.  PHYSICAL EXAMINATION: ECOG PERFORMANCE STATUS: 1 - Symptomatic but completely ambulatory  Filed Vitals:   01/31/15 1106  BP: 143/79  Pulse: 68  Temp: 98.1 F (36.7 C)  Resp: 21   Filed Weights   01/31/15 1106  Weight: 346 lb 12.8 oz (157.307 kg)    GENERAL:alert, no distress and comfortable SKIN: skin color, texture, turgor are normal, no rashes or significant lesions EYES: normal, conjunctiva are pink and non-injected, sclera clear OROPHARYNX:no  exudate, no erythema and lips, buccal mucosa, and tongue normal  NECK: supple, thyroid normal size, non-tender, without nodularity LYMPH:  no palpable lymphadenopathy in the cervical, axillary or inguinal LUNGS: clear to auscultation and percussion with normal breathing effort HEART: regular rate & rhythm and no murmurs and no lower extremity edema ABDOMEN:abdomen soft, normal bowel sounds, mildly tender to the RUQ with deep palpation. Pt declined rectal exam.  Musculoskeletal:no cyanosis of digits and no clubbing  PSYCH: alert & oriented x 3 with fluent speech NEURO: no focal motor/sensory deficits  LABORATORY DATA:  I have reviewed the data as listed Lab Results  Component Value Date   WBC 9.9 09/12/2009   HGB 12.3 09/12/2009   HCT 36.4 09/12/2009   MCV 86.7 09/12/2009   PLT 216 09/12/2009   No results for input(s): NA, K, CL, CO2, GLUCOSE, BUN, CREATININE, CALCIUM, GFRNONAA, GFRAA, PROT, ALBUMIN, AST, ALT, ALKPHOS, BILITOT, BILIDIR, IBILI in the last 8760 hours.  Diagnosis 01/13/2015 1. Duodenum, Biopsy - MILD PATCHY INCREASE IN INTRAEPITHELIAL LYMPHOCYTES, SEE COMMENT. - NO DYSPLASIA OR MALIGNANCY. 2. Stomach, polyp(s), gastric - FUNDIC GLAND POLYPS. - NO INTESTINAL METAPLASIA, DYSPLASIA, OR MALIGNANCY. 3. Stomach, biopsy, gastric antrum - CHRONIC GASTRITIS. - NEGATIVE FOR HELICOBACTER PYLORI. - NO INTESTINAL METAPLASIA, DYSPLASIA, OR MALIGNANCY. 4. Rectum, biopsy, rectal mass - INVASIVE ADENOCARCINOMA. Microscopic Comment 1. There is a patchy mild increase in intraepithelial lymphocytes, including at the tips of villi. However, there is no significant villous blunting. This pattern is non-specific and can be seen in celiac disease, NSAID injury, H. pylori infection, etc. 3. A Warthin-Starry stain is performed to determine the possibility of the presence of Helicobacter pylori. The Warthin-Starry stain is negative for organisms of Helicobacter pylori. 4. Dr. Saralyn Pilar has  reviewed the case. The case was called to Dr. Amedeo Plenty' office on 01/17/2015.   RADIOGRAPHIC STUDIES: I have personally reviewed the radiological images as listed and agreed with the findings in the report.  Ct Abdomen Pelvis W Contrast 01/20/2015    IMPRESSION: Right lateral rectal wall exophytic mass likely corresponding to the known neoplasm.  Two ill-defined hepatic hypo enhancing lesions which appears to correspond to the lesions seen on the prior MRI. Please note evaluation of the liver is very limited due to  streak artifact. If there is clinical concern for new metastatic disease MRI may provide better evaluation.  Small left ovarian cystic lesion corresponding to the lesion reported on the prior pelvic ultrasound. Ultrasound may provide better evaluation of the pelvic structures.   Electronically Signed   By: Anner Crete M.D.   On: 01/20/2015 16:15   Lower endoscopic ultrasound by Dr. Paulita Fujita 01/18/2015 FINDINGS: Normal digital rectal exam; could not palpate mass. In proximal rectum extending to the distal rectum, from 12 cm to 20 cm from the anal verge, a partial circumferential (50-75% circumferential distally, 75-100% circumferential proximall) mass was seen. Mass was firm, fixed, ulcerated, friable. EUS radial scope was able to traverse the tumor into normal-appearing distal sigmoid colon. Lesion invaded through the wall of the rectum into neighboring connective tissues in multiple regions. There were several round hypoechoic well-defined malignant-appearing lymph nodes in the vicinity of the proximal portion of the tumor. STAGING: T3 N2 Mx via rectal ultrasound ENDOSCOPIC IMPRESSION: As above. Rectosigmoid mass, biopsy-proven adenocarcinoma, with local invasion noted under ultrasound. RECOMMENDATIONS: 1. Watch for potential complications of procedure. 2. Needs CT chest, abdomen, pelvis to complete staging. 3. Based already on rectal ultrasound results, patient would not be candidate for  upfront surgery. She will need surgical and radiation oncology consultations. 4. Will discuss with Dr. Amedeo Plenty.   ASSESSMENT & PLAN: Ms. Hartsough is a 49 year old female with chronic abdominal pain with rectal mass on colonoscopy found to be invasive rectal adenocarcinoma.   1. T3N2Mx, at least Stage IIIB/C rectal adenocarcinoma -We reviewed with the patient her biopsy and imaging results along with records sent to [place] from her GI doctor. We explained to her the significance of her tumor markers, CEA and CA 19-9, and their low levels as favorable.  -To determine the extent of disease, we recommended a follow-up chest CT. Her GI doctor has also scheduled an abdominal MRI to further evaluate her liver lesions.  -If her CT chest and liver MRI is negative or metastatic disease, she has stage IIIc disease. Given the locally advanced tumor, we recommended undergoing neoadjuvant therapy with either 5-FU or capecitabine followed by surgical management and adjuvant chemotherapy.  -Will refer her to rad/onc  -We will present her case in GI tumor board.   -We discussed with the risks associated with chemotherapy, including but not limited to anorexia, fatigue, weight loss, dizziness, hair loss, neuropathy. -will check her tumor for MMR  2. Microcytic anemia: Likely in the setting of ongoing blood loss from her malignancy. Hb 11.7, MCV 80.0 when last checked on 01/17/15, down from Hb 13.1, MCV 86.5 on 07/27/12.  -Check labs at the time of follow-up  3. Diarrhea: Likely related to her cancer.  -We recommended Imodium and watching her volume status as it could contribute to her feelings of low energy.  Plan: -Refer to radiation oncology -Recommended Imodium for control of diarrhea -Recommended electrolyte solution for rehydration -Schedule follow-up for chemo class -Check CBC and iron studies -Follow-up chest CT, abdominal MRI -RTC late next week to finalize her treatment plan    Orders Placed This  Encounter  Procedures  . CT Chest W Contrast    Standing Status: Future     Number of Occurrences:      Standing Expiration Date: 01/31/2016    Order Specific Question:  Reason for Exam (SYMPTOM  OR DIAGNOSIS REQUIRED)    Answer:  rule out metastasis    Order Specific Question:  Is the patient pregnant?  Answer:  No    Order Specific Question:  Preferred imaging location?    Answer:  [facility]  . Ambulatory referral to Radiation Oncology    Referral Priority:  Routine    Referral Type:  Consultation    Referral Reason:  Specialty Services Required    Requested Specialty:  Radiation Oncology    Number of Visits Requested:  [place], [facility]  01/31/2015 5:09 PM  I have seen the patient, examined her. I agree with the assessment and and plan and have edited the notes.   I spent 60 mins for the consultation, >50% face to face discussion.   Truitt Merle  01/31/2015

## 2015-01-31 NOTE — [facility] Oncology Navigator Note (Signed)
Oncology Nurse Navigator Documentation  Oncology Nurse Navigator Flowsheets 01/31/2015  Referral date to RadOnc/MedOnc 01/20/2015  Navigator Encounter Type Initial MedOnc  Patient Visit Type Medonc  Barriers/Navigation Needs Education-  Education Pain/ Symptom Management  Interventions Education Method  Education Method Verbal;Written;Teach-back  Support Groups/Services GI;ACS  Time Spent with Patient 15  Met with patient and husband, Roselyn Reef during new patient visit. Explained the role of the GI Nurse Navigator and provided New Patient Packet with information on: 1. Colorectal cancer 2. Support groups 3. Advanced Directives 4. Fall Safety Plan Answered questions, reviewed current treatment plan using TEACH back and provided emotional support. She agrees to contact with ACS for Patient Manager to be sent. No transportation or financial issues at this time. She works as patient Government social research officer for [employer] and much of her work can be done from home if necessary. Briefly reviewed Xeloda and RT and how they work together as well as skin care to rectal area. She reports she has been on a "blog" and is aware of how bad the skin breakdown can be. She is anxious but knows she must do this for herself and her family. Children are age 23 (both adopted) and the boy has cerebral palsy. Husband is a stay at home dad due to medical disability.  Merceda Elks, RN, BSN GI Oncology [place]

## 2015-02-03 ENCOUNTER — Telehealth: Payer: Self-pay | Admitting: *Deleted

## 2015-02-03 NOTE — Progress Notes (Addendum)
GI Location of Tumor / Histology: Rectal Cancer  Lindsay Little presented  months ago with symptoms of:  67month diarrhea and chronic  abdominal pain ,   Biopsies of  (if applicable) revealed:Diagnosis 01/13/15: 1. Duodenum, Biopsy - MILD PATCHY INCREASE IN INTRAEPITHELIAL LYMPHOCYTES, SEE COMMENT.- NO DYSPLASIA OR MALIGNANCY. 2. Stomach, polyp(s), gastric - FUNDIC GLAND POLYPS.- NO INTESTINAL METAPLASIA, DYSPLASIA, OR MALIGNANCY. 3. Stomach, biopsy, gastric antrum - CHRONIC GASTRITIS.- NEGATIVE FOR HELICOBACTER PYLORI. - NO INTESTINAL METAPLASIA, DYSPLASIA, OR MALIGNANCY. 4. Rectum, biopsy, rectal mass - INVASIVE ADENOCARCINOMA.  Past/Anticipated interventions by surgeon, if any:  Past/Anticipated interventions by medical oncology, if any: Dr. FBurr Medico7/19./16   Weight changes, if any: 7 lb wt.loss past month    Bowel/Bladder complaints, if any: diarrhea every 1-2 hours after eating, IBS, taking imodium  As of yesterday, has had 4 loose stools today has taken  2 so far today, yesterday 15-20 loose stools, eating low fiber diet, Nausea / Vomiting, if any: No  Pain issues, if any:  Bottom mild /2/3 now  Any blood per rectum:  Bleeding with bowel movements  following EUS, bleeding stopped last week,   SAFETY ISSUES:  Prior radiation? NO  Pacemaker/ICD? NO  Possible current pregnancy?  NO  Is the patient on methotrexate? NO  Married,  2 children,  Depression,  smoker for  20 years(1p week for 7 years),occasional alcohol use, no illicit drug use,  ,Maternal Uncle pancreatic cancer mid-late 60's spread to prostate and colon died 152years later, sister with non-melanoma skin cancer, back,   BP 123/78 mmHg  Pulse 71  Temp(Src) 98 F (36.7 C) (Oral)  Resp 20  Ht 5' 11"  (1.803 m)  Wt 347 lb 9.6 oz (157.67 kg)  BMI 48.50 kg/m2  Wt Readings from Last 3 Encounters:  02/06/15 347 lb 9.6 oz (157.67 kg)  01/31/15 346 lb 12.8 oz (157.307 kg)  01/18/15 342 lb (155.13 kg)

## 2015-02-03 NOTE — Telephone Encounter (Signed)
Pt called asking for copay amt of chemo stating that she hasn't heard from anyone & she is trying to make some decisions.  Reviewed chart & per Dr Ernestina Penna note, pt is to return next week to finalize plans.  She discussed xeloda vs 5FU but I don't see any doses.  Message left with Raquel/Managed Care to see if she can find out anything.  Pt states Dr Burr Medico discussed xeloda bid 5 days/wk x 5 wks.  Pt reports [facility]. Will discuss with Dr Burr Medico on Monday.

## 2015-02-05 ENCOUNTER — Ambulatory Visit
Admission: RE | Admit: 2015-02-05 | Discharge: 2015-02-05 | Disposition: A | Discharge status: Home / Self Care | Payer: BLUE CROSS/BLUE SHIELD | Source: Ambulatory Visit | Attending: Gastroenterology | Admitting: Gastroenterology

## 2015-02-05 DIAGNOSIS — D1800 Hemangioma unspecified site: Secondary | ICD-10-CM

## 2015-02-05 MED ORDER — GADOXETATE DISODIUM 0.25 MMOL/ML IV SOLN
13.0000 mL | Freq: Once | INTRAVENOUS | Status: AC | PRN
  Administered 2015-02-05: 13 mL via INTRAVENOUS

## 2015-02-06 ENCOUNTER — Ambulatory Visit
Admission: RE | Admit: 2015-02-06 | Discharge: 2015-02-06 | Disposition: A | Discharge status: Home / Self Care | Payer: BLUE CROSS/BLUE SHIELD | Source: Ambulatory Visit | Attending: Radiation Oncology | Admitting: Radiation Oncology

## 2015-02-06 ENCOUNTER — Telehealth: Payer: Self-pay | Admitting: *Deleted

## 2015-02-06 ENCOUNTER — Encounter: Payer: Self-pay | Admitting: Radiation Oncology

## 2015-02-06 VITALS — BP 123/78 | HR 71 | Temp 98.0°F | Resp 20 | Ht 71.0 in | Wt 347.6 lb

## 2015-02-06 DIAGNOSIS — K921 Melena: Secondary | ICD-10-CM | POA: Diagnosis not present

## 2015-02-06 DIAGNOSIS — R110 Nausea: Secondary | ICD-10-CM | POA: Insufficient documentation

## 2015-02-06 DIAGNOSIS — R140 Abdominal distension (gaseous): Secondary | ICD-10-CM | POA: Insufficient documentation

## 2015-02-06 DIAGNOSIS — Z79899 Other long term (current) drug therapy: Secondary | ICD-10-CM | POA: Diagnosis not present

## 2015-02-06 DIAGNOSIS — R197 Diarrhea, unspecified: Secondary | ICD-10-CM | POA: Insufficient documentation

## 2015-02-06 DIAGNOSIS — Z510 Encounter for antineoplastic radiation therapy: Secondary | ICD-10-CM | POA: Diagnosis not present

## 2015-02-06 DIAGNOSIS — Z880 Allergy status to penicillin: Secondary | ICD-10-CM | POA: Diagnosis not present

## 2015-02-06 DIAGNOSIS — C20 Malignant neoplasm of rectum: Secondary | ICD-10-CM | POA: Insufficient documentation

## 2015-02-06 DIAGNOSIS — Z882 Allergy status to sulfonamides status: Secondary | ICD-10-CM | POA: Insufficient documentation

## 2015-02-06 NOTE — Telephone Encounter (Signed)
Called pt after speaking with Brian/Pharmacist/[facility] who states pt's co-pay for xeloda 514m x 4 = 2000 mg & xeloda 150 mg x 2 = 300 mg both bid x 30 days would be $150 each.  Co-pay could be reduced if able to give one strength.  Dr FBurr Mediconotified & she states she is waiting on CT scan but hopefully would be able to modify this dose with one script. Left message for pt to call back.

## 2015-02-06 NOTE — Progress Notes (Signed)
Please see the Nurse Progress Note in the MD Initial Consult Encounter for this patient. 

## 2015-02-06 NOTE — Progress Notes (Signed)
Radiation Oncology         [phone] ________________________________  Name: Lindsay Little MRN: [id number]  Date: 02/06/2015  DOB: 1966/05/06  WU:[id number],[id number] S, MD  Truitt Merle, MD     REFERRING PHYSICIAN: Truitt Merle, MD   DIAGNOSIS: The encounter diagnosis was Rectal adenocarcinoma.  Rectal adenocarcinoma   Staging form: Colon and Rectum, AJCC 7th Edition     Clinical: T3, N2, M0 - Unsigned  HISTORY OF PRESENT ILLNESS::Lindsay Little is a 49 y.o. female who is seen for an initial consultation visit regarding the patient's diagnosis of rectal cancer. The patient presented with initial symptoms of diarrhea, pelvic pain, abdominal pain. These symptoms began 6 months ago.  Current symptoms include abdominal pain, worsening diarrhea (past 6 months), poor appetite, lost 26 lbs.   On 09/06/13, she presented to Dr. Starr Little [facility]] with 62-monthhistory of diarrhea and worsening abdominal pain. She has a history of chronic abdominal pain and had been previously [place]. HAmedeo Little She presented with worsening of diarrhea for 6 month and was reevaluated by Dr. HAmedeo Little She was seen on 12/06/14 at which time it was decided she would undergo HIDA scan given that the pain was intermittently in the RUQ in addition to upper and lower endoscopy. HIDA scan 12/15/14 was reassuring, but she was found to have a malignant tumor of the rectum about 12-18 cm proximal to the anus, gastritis, gastric polyps on endoscopies 01/13/15.   Endoscopy/ colonoscopy has been performed. A rectal mass was noted. A complete colonoscopy beyond the tumor was able to be performed. Biopsies of anus, gastritis, gastric polyps and duodenum was performed. This returned positive for invasive adenocarcinoma, chronic gastritis [H. Pylori negative], fundic gland polyps, and non-specific mild patchy intra-epithelial lymphocytes.   An  endoscopic ultrasound (01/18/15) has been performed. Based on the this study, the tumor was staged as T3N2. The distance from the anal verge was 12 cm. The length of the tumor was 8 cm. The tumor was described as non-circumferential and friable.   Based on current imaging, a corresponding rectal tumor was seen. Evidence of significant bowel obstruction was not seen. Pelvic/ regional nodes was not seen. Distant metastatic disease was not seen. Abdominal [place] noted to have some hypoechoicity in the left hepatic lobe which was confirmed on MRI to be two hemangiomas in the right hepatic lobe of measuring up to 3.0 cm along with an additional 7 mm probable cyst.  Patient notes that she is scheduled for a chest CT scan tomorrow.   PREVIOUS RADIATION THERAPY: No   PAST MEDICAL HISTORY:  has a past medical history of Hypertension; Hypercholesteremia; Obesity; Tear of left rotator cuff; IBS (irritable bowel syndrome); Vertigo; TMJ (dislocation of temporomandibular joint); Bulging disc; Mild sleep apnea; Depression; Rectal bleeding; Vitamin D deficiency; and Osteoarthritis, knee.     PAST SURGICAL HISTORY: Past Surgical History  Procedure Laterality Date  . Rotator cuff repair    . Eus N/A 01/18/2015    Procedure: LOWER ENDOSCOPIC ULTRASOUND (EUS);  Surgeon: Lindsay Silence MD;  [facility];  Service: Endoscopy;  Laterality: N/A;  . Abdominal hysterectomy  1996    Removed uterus, and tubes     FAMILY HISTORY: family history includes CVA in her maternal grandmother; Cancer in her sister; Cancer (age of onset: 634 in her maternal uncle; Cirrhosis in her maternal uncle; Coronary artery disease in her  father, paternal grandfather, and paternal grandmother; Coronary artery disease (age of onset: 5) in her mother; Coronary artery disease (age of onset: 17) in her maternal grandfather; Diabetes Mellitus I in her maternal grandmother, mother, and paternal grandmother; Hyperlipidemia in her brother,  father, maternal grandfather, maternal grandmother, mother, paternal grandfather, and paternal grandmother; Hypertension in her father, maternal grandfather, maternal grandmother, mother, paternal grandfather, and paternal grandmother.   SOCIAL HISTORY:  reports that she has quit smoking. She does not have any smokeless tobacco history on file. She reports that she drinks about 0.6 oz of alcohol per week.   ALLERGIES: Caine-1; Penicillins; and Sulfa antibiotics   MEDICATIONS:  Current Outpatient Prescriptions  Medication Sig Dispense Refill  . Coenzyme Q10 (COQ-10) 100 MG CAPS Take 100 mg by mouth daily.    . DULoxetine (CYMBALTA) 60 MG capsule Take 60 mg by mouth daily.    Marland Kitchen esomeprazole (NEXIUM) 40 MG capsule Take 40 mg by mouth 2 (two) times daily.    . furosemide (LASIX) 20 MG tablet Take 20 mg by mouth as needed.    . hydrochlorothiazide (HYDRODIURIL) 25 MG tablet Take 25 mg by mouth daily.    Marland Kitchen HYDROcodone-acetaminophen (NORCO/VICODIN) 5-325 MG per tablet Take 1 tablet by mouth as needed for moderate pain.    . hyoscyamine (LEVBID) 0.375 MG 12 hr tablet Take 0.375 mg by mouth 2 (two) times daily.    Marland Kitchen loperamide (IMODIUM) 2 MG capsule Take by mouth as needed for diarrhea or loose stools.    . meloxicam (MOBIC) 15 MG tablet Take 15 mg by mouth daily.    . nebivolol (BYSTOLIC) 5 MG tablet Take 5 mg by mouth daily.    . rosuvastatin (CRESTOR) 5 MG tablet Take 5 mg by mouth daily.    . Vitamin D, Ergocalciferol, (DRISDOL) 50000 UNITS CAPS capsule Take 50,000 Units by mouth 2 (two) times a week.    [place] Rocker alk-PHENObarbital (DONNATAL) 16.2 MG tablet Take 1 tablet by mouth as needed.    . methocarbamol (ROBAXIN) 500 MG tablet Take 500 mg by mouth as needed for muscle spasms.     No current facility-administered medications for this encounter.     REVIEW OF SYSTEMS:  A 15 point review of systems is documented in the electronic medical record. This was obtained by the nursing staff.  However, I reviewed this with the patient to discuss relevant findings and make appropriate changes.  Pertinent items are noted in HPI.    PHYSICAL EXAM:  height is 5' 11"  (1.803 m) and weight is 347 lb 9.6 oz (157.67 kg). Her oral temperature is 98 F (36.7 C). Her blood pressure is 123/78 and her pulse is 71. Her respiration is 20.   ECOG = 1  0 - Asymptomatic (Fully active, able to carry on all predisease activities without restriction)  1 - Symptomatic but completely ambulatory (Restricted in physically strenuous activity but ambulatory and able to carry out work of a light or sedentary nature. For example, light housework, office work)  2 - Symptomatic, <50% in bed during the day (Ambulatory and capable of all self care but unable to carry out any work activities. Up and about more than 50% of waking hours)  3 - Symptomatic, >50% in bed, but not bedbound (Capable of only limited self-care, confined to bed or chair 50% or more of waking hours)  4 - Bedbound (Completely disabled. Cannot carry on any self-care. Totally confined to bed or chair)  5 - Death  Oken MM, Creech RH, Tormey DC, et al. [id number]). "Toxicity and response criteria of the [facility]". [place] Oncol. 5 (6): 649-55  General: Well-developed, in no acute distress HEENT: Normocephalic, atraumatic; oral cavity clear Neck: Supple without any lymphadenopathy Cardiovascular: Regular rate and rhythm Respiratory: Clear to auscultation bilaterally GI: Soft, nontender, normal bowel sounds Extremities: No edema present Neuro: No focal deficits Lymphadenopathy:  Inguinal lymphadenopathy was not identified. Rectal:  Rectal tumor was not palpable. Bleeding was not present.    LABORATORY DATA:  Lab Results  Component Value Date   WBC 9.9 09/12/2009   HGB 12.3 09/12/2009   HCT 36.4 09/12/2009   MCV 86.7 09/12/2009   PLT 216 09/12/2009   Lab Results  Component Value Date   NA 137 09/12/2009    K 4.1 09/12/2009   CL 101 09/12/2009   CO2 28 09/12/2009   No results found for: ALT, AST, GGT, ALKPHOS, BILITOT  CEA: 1.4    RADIOGRAPHY: Mr Abdomen W Wo Contrast  02/06/2015   CLINICAL DATA:  49 year old female with history of Crohn's disease, recently diagnosed with rectal cancer. Abdominal pain and abnormal stools for 6 months.  EXAM: MRI ABDOMEN WITHOUT AND WITH CONTRAST  TECHNIQUE: Multiplanar multisequence MR imaging of the abdomen was performed both before and after the administration of intravenous contrast.  CONTRAST:  13 mL of MultiHance.  COMPARISON:  CT of the abdomen and pelvis 01/20/2015. MRI of the abdomen 10/06/2013.  FINDINGS: Lower chest:  Unremarkable.  Hepatobiliary: In segment 5 of the liver there is a 2.8 x 1.9 cm lesion, and in segment 7 of the liver there is a 1.9 x 1.7 cm lesion. Both of these are slightly T2 hyperintense, T1 hypointense, and demonstrate peripheral nodular enhancement with slight progressive centripetal filling, diagnostic of cavernous hemangiomas. These are very similar to prior study 10/06/2013. There is also a tiny 7 mm lesion between segments 5 and 6 in the liver that measures 7 mm, which is also slightly T1 hypointense and T2 hyperintense, without definite enhancement, either and additional small cavernous hemangioma or small benign lesions such as a cyst (unchanged). No new suspicious appearing hepatic lesions are otherwise noted. No intra or extrahepatic biliary ductal dilatation. In the dependent portion of the gallbladder there is some T1 hyperintense, T2 hypointense material, most compatible with some biliary sludge. Gallbladder is otherwise normal in appearance.  Pancreas: No pancreatic mass. No pancreatic ductal dilatation. No pancreatic or peripancreatic fluid or inflammatory changes.  Spleen: Unremarkable.  Adrenals/Urinary Tract: Bilateral kidneys and bilateral adrenal glands are normal in appearance. No hydroureteronephrosis in the visualized  abdomen.  Stomach/Bowel: Visualized portions of the stomach, small bowel and colon are normal in appearance.  Vascular/Lymphatic: No aneurysm identified in the visualized abdominal vasculature. No lymphadenopathy noted in the abdomen.  Other: No significant volume of ascites in the visualized peritoneal cavity.  Musculoskeletal: No aggressive osseous lesions noted in the visualized portions of the skeleton.  IMPRESSION: 1. Small cavernous hemangiomas in segments 5 and 7 of the liver redemonstrated, as above. Additional 7 mm indeterminate lesion is also favored to represent a tiny cavernous hemangioma, but may alternatively represent a small cyst. No new suspicious appearing hepatic lesions to suggest presence of metastatic disease at this time. 2. Mild hepatic steatosis.   Electronically Signed   By: Vinnie Langton M.D.   On: 02/06/2015 08:26   Ct Abdomen Pelvis W Contrast  01/20/2015   CLINICAL DATA:  49 year old female with recent diagnosis of  breast rectal cancer. Evaluate for dx metastatic disease.  EXAM: CT ABDOMEN AND PELVIS WITH CONTRAST  TECHNIQUE: Multidetector CT imaging of the abdomen and pelvis was performed using the standard protocol following bolus administration of intravenous contrast.  CONTRAST:  139m ISOVUE-300 IOPAMIDOL (ISOVUE-300) INJECTION 61%  COMPARISON:  MRI dated 10/06/2013 and ultrasound dated 09/17/2013  FINDINGS: Visualized lung bases are clear.  No intra-abdominal free air or free fluid.  Evaluation of the liver is limited due to streak artifact. An ill-defined 2.5 x 2.5 cm subcapsular low attenuating/hypo enhancing lesion in the right lobe of the liver inferiorly (series 3, image 34) as well as a 1.7 x 0.8 cm ill-defined hypoenhancing lesion in the right lobe of the liver posteriorly (series 3, image 24) correspond to the lesions go seen on the MRI characterized as a hemangioma. Additional lesions seen on the prior MRI are not visualized on the current study. MRI may provide  better evaluation if there is high clinical suspicion or new concern for metastatic disease. No intrahepatic biliary ductal dilatation. The gallbladder, pancreas, spleen, and adrenal glands appear unremarkable. There is bilateral renal cortical lobularity. No hydronephrosis. The visualized ureters appear unremarkable. The urinary bladder is collapsed. Hysterectomy. A 3.0 x 2.8 cm left ovarian hypodense lesion is not well characterized but may correspond to the cystic lesion described on the prior ultrasound report dated 04/30/2011. Ultrasound may provide better evaluation of the pelvic structures.  A 3.7 x 3.9 cm soft tissue mass is noted abutting the right rectosigmoid likely corresponding to the known rectal cancer. There is no evidence of bowel obstruction or inflammation. The appendix appears unremarkable  The visualized abdominal aorta and IVC appear unremarkable. There is no lymphadenopathy.  Small fat containing umbilical hernia.  Mild degenerative changes of the spine.    No acute fracture.  IMPRESSION: Right lateral rectal wall exophytic mass likely corresponding to the known neoplasm.  Two ill-defined hepatic hypo enhancing lesions which appears to correspond to the lesions seen on the prior MRI. Please note evaluation of the liver is very limited due to streak artifact. If there is clinical concern for new metastatic disease MRI may provide better evaluation.  Small left ovarian cystic lesion corresponding to the lesion reported on the prior pelvic ultrasound. Ultrasound may provide better evaluation of the pelvic structures.   Electronically Signed   By: AAnner CreteM.D.   On: 01/20/2015 16:15       IMPRESSION:    Rectal adenocarcinoma   01/13/2015 Initial Diagnosis Rectal adenocarcinoma   01/13/2015 Procedure Colonoscopy showed a sensitivity O nonobstructing mass in the rectum and from 12-18 cm proximal to the Annis. The mass was circumferential, measuring about 6 cm in length. EGD was  negative.   01/17/2015 Tumor Marker CEA 1.4, CA-19-9 8    01/18/2015 Procedure Lower endoscopic ultrasound by Dr. oPaulita Fujitashowed a T3 N2 rectal mass.   01/20/2015 Imaging CT abdomen and pelvis with contrast showed right lateral rectal wall exophytic mass, and 2 ill-defined hepatic hypoenhancing lesions which appears to correspond to the lesion seen on the prior MRI in 2015.    The patient is an appropriate candidate for preoperative chemoradiation treatment. The tumor appears to extend into the upper rectum. The patient does wish to be aggressive with regards to her management overall.  I discussed with the patient the rationale of radiation treatment in this setting. I discussed the benefit in terms of local/regional control and we also discussed how this can aid surgical resection. We also  discussed the potential side effects and risks of treatment as well.   All of the patient's questions were answered. The patient wishes to proceed with radiation treatment.   PLAN: The patient will proceed with a simulation in the near future such that we can proceed with treatment planning. I anticipate treating the patient to 50.4 Gy in 5.5 weeks. This will correspond to a 3-D conformal technique with daily optical guidance and weekly port films to help ensure accurate localization of the target volume. I anticipate beginning this treatment on 02/20/15. The patient also is seeing medical oncology and concurrent chemotherapy will be coordinated.     Pending issues:  CT chest       ________________________________   Jodelle Gross, MD, PhD   **Disclaimer: This note was dictated with voice recognition software. Similar sounding words can inadvertently be transcribed and this note may contain transcription errors which may not have been corrected upon publication of note.**

## 2015-02-07 ENCOUNTER — Telehealth: Payer: Self-pay | Admitting: *Deleted

## 2015-02-07 ENCOUNTER — Ambulatory Visit (HOSPITAL_COMMUNITY)
Admission: RE | Admit: 2015-02-07 | Discharge: 2015-02-07 | Disposition: A | Discharge status: Home / Self Care | Payer: BLUE CROSS/BLUE SHIELD | Source: Ambulatory Visit | Attending: Hematology | Admitting: Hematology

## 2015-02-07 ENCOUNTER — Encounter: Payer: Self-pay | Admitting: Surgery

## 2015-02-07 ENCOUNTER — Encounter (HOSPITAL_COMMUNITY): Payer: Self-pay

## 2015-02-07 DIAGNOSIS — K429 Umbilical hernia without obstruction or gangrene: Secondary | ICD-10-CM | POA: Insufficient documentation

## 2015-02-07 DIAGNOSIS — M1712 Unilateral primary osteoarthritis, left knee: Secondary | ICD-10-CM | POA: Insufficient documentation

## 2015-02-07 DIAGNOSIS — K5090 Crohn's disease, unspecified, without complications: Secondary | ICD-10-CM | POA: Insufficient documentation

## 2015-02-07 DIAGNOSIS — C20 Malignant neoplasm of rectum: Secondary | ICD-10-CM

## 2015-02-07 DIAGNOSIS — D1803 Hemangioma of intra-abdominal structures: Secondary | ICD-10-CM | POA: Insufficient documentation

## 2015-02-07 MED ORDER — IOHEXOL 300 MG/ML  SOLN
100.0000 mL | Freq: Once | INTRAMUSCULAR | Status: AC | PRN
  Administered 2015-02-07: 80 mL via INTRAVENOUS

## 2015-02-07 NOTE — Telephone Encounter (Signed)
Spoke with pt and informed pt re:  Per Dr. Burr Medico, staging CT is still pending.  If no metastastic disease,  Xeloda  565m x 4  =  2000 mg and    150 mg x 2   =  300 mg  -   BID x  30 ;   copay will be  $ 150.00  Pt has CT scan today.  Pt voiced understanding.

## 2015-02-08 ENCOUNTER — Other Ambulatory Visit: Payer: Self-pay | Admitting: *Deleted

## 2015-02-08 DIAGNOSIS — C20 Malignant neoplasm of rectum: Secondary | ICD-10-CM

## 2015-02-09 ENCOUNTER — Other Ambulatory Visit (HOSPITAL_BASED_OUTPATIENT_CLINIC_OR_DEPARTMENT_OTHER): Payer: BLUE CROSS/BLUE SHIELD

## 2015-02-09 ENCOUNTER — Other Ambulatory Visit: Payer: BLUE CROSS/BLUE SHIELD

## 2015-02-09 ENCOUNTER — Encounter: Payer: Self-pay | Admitting: Hematology

## 2015-02-09 ENCOUNTER — Encounter: Payer: Self-pay | Admitting: *Deleted

## 2015-02-09 ENCOUNTER — Ambulatory Visit (HOSPITAL_BASED_OUTPATIENT_CLINIC_OR_DEPARTMENT_OTHER): Payer: BLUE CROSS/BLUE SHIELD | Admitting: Hematology

## 2015-02-09 ENCOUNTER — Telehealth: Payer: Self-pay | Admitting: *Deleted

## 2015-02-09 ENCOUNTER — Telehealth: Payer: Self-pay | Admitting: Hematology

## 2015-02-09 VITALS — BP 146/59 | HR 67 | Temp 98.4°F | Resp 18 | Ht 71.0 in | Wt 342.5 lb

## 2015-02-09 DIAGNOSIS — K769 Liver disease, unspecified: Secondary | ICD-10-CM | POA: Diagnosis not present

## 2015-02-09 DIAGNOSIS — D509 Iron deficiency anemia, unspecified: Secondary | ICD-10-CM

## 2015-02-09 DIAGNOSIS — C20 Malignant neoplasm of rectum: Secondary | ICD-10-CM | POA: Diagnosis not present

## 2015-02-09 DIAGNOSIS — R197 Diarrhea, unspecified: Secondary | ICD-10-CM

## 2015-02-09 LAB — COMPREHENSIVE METABOLIC PANEL (CC13)
ALK PHOS: 71 U/L (ref 40–150)
ALT: 12 U/L (ref 0–55)
AST: 12 U/L (ref 5–34)
Albumin: 3.5 g/dL (ref 3.5–5.0)
Anion Gap: 9 mEq/L (ref 3–11)
BUN: 10.7 mg/dL (ref 7.0–26.0)
CHLORIDE: 104 meq/L (ref 98–109)
CO2: 27 mEq/L (ref 22–29)
CREATININE: 0.7 mg/dL (ref 0.6–1.1)
Calcium: 9.8 mg/dL (ref 8.4–10.4)
Glucose: 143 mg/dl — ABNORMAL HIGH (ref 70–140)
POTASSIUM: 4.1 meq/L (ref 3.5–5.1)
SODIUM: 140 meq/L (ref 136–145)
Total Bilirubin: 0.48 mg/dL (ref 0.20–1.20)
Total Protein: 6.9 g/dL (ref 6.4–8.3)

## 2015-02-09 LAB — IRON AND TIBC CHCC
%SAT: 12 % — AB (ref 21–57)
IRON: 40 ug/dL — AB (ref 41–142)
TIBC: 345 ug/dL (ref 236–444)
UIBC: 305 ug/dL (ref 120–384)

## 2015-02-09 LAB — CBC WITH DIFFERENTIAL/PLATELET
BASO%: 0.3 % (ref 0.0–2.0)
Basophils Absolute: 0 10*3/uL (ref 0.0–0.1)
EOS ABS: 0.6 10*3/uL — AB (ref 0.0–0.5)
EOS%: 5.2 % (ref 0.0–7.0)
HEMATOCRIT: 35.7 % (ref 34.8–46.6)
HGB: 11.3 g/dL — ABNORMAL LOW (ref 11.6–15.9)
LYMPH%: 16.7 % (ref 14.0–49.7)
MCH: 26.2 pg (ref 25.1–34.0)
MCHC: 31.7 g/dL (ref 31.5–36.0)
MCV: 82.6 fL (ref 79.5–101.0)
MONO#: 0.6 10*3/uL (ref 0.1–0.9)
MONO%: 6.1 % (ref 0.0–14.0)
NEUT#: 7.5 10*3/uL — ABNORMAL HIGH (ref 1.5–6.5)
NEUT%: 71.7 % (ref 38.4–76.8)
Platelets: 326 10*3/uL (ref 145–400)
RBC: 4.32 10*6/uL (ref 3.70–5.45)
RDW: 14.2 % (ref 11.2–14.5)
WBC: 10.5 10*3/uL — ABNORMAL HIGH (ref 3.9–10.3)
lymph#: 1.8 10*3/uL (ref 0.9–3.3)

## 2015-02-09 LAB — FERRITIN CHCC: Ferritin: 28 ng/ml (ref 9–269)

## 2015-02-09 MED ORDER — CAPECITABINE 500 MG PO TABS: ORAL_TABLET | ORAL | Status: DC

## 2015-02-09 MED ORDER — DIPHENOXYLATE-ATROPINE 2.5-0.025 MG PO TABS: 1.0000 | ORAL_TABLET | Freq: Four times a day (QID) | ORAL | Status: DC | PRN

## 2015-02-09 NOTE — Progress Notes (Signed)
[place]  Telephone:(336) [phone] Fax:(336) [phone]  Clinic New Consult Note   Patient Care Team: [name], MD as PCP - General (Family Medicine) Michael Boston, MD as Consulting Physician (General Surgery) Truitt Merle, MD as Consulting Physician (Medical Oncology) Kyung Rudd, MD as Consulting Physician (Radiation Oncology) Teena Irani, MD as Consulting Physician (Gastroenterology) 02/09/2015  CHIEF COMPLAINTS/PURPOSE OF CONSULTATION:  Follow up rectal cancer   Oncology History   Rectal adenocarcinoma   Staging form: Colon and Rectum, AJCC 7th Edition     Clinical: T3, N2, M0 - Unsigned       Rectal adenocarcinoma   01/13/2015 Initial Diagnosis Rectal adenocarcinoma   01/13/2015 Procedure Colonoscopy showed a sensitivity O nonobstructing mass in the rectum and from 12-18 cm proximal to the Annis. The mass was circumferential, measuring about 6 cm in length. EGD was negative.   01/17/2015 Tumor Marker CEA 1.4, CA-19-9 8    01/18/2015 Procedure Lower endoscopic ultrasound by Dr. Paulita Fujita showed a T3 N2 rectal mass.   01/20/2015 Imaging CT abdomen and pelvis with contrast showed right lateral rectal wall exophytic mass, and 2 ill-defined hepatic hypoenhancing lesions which appears to correspond to the lesion seen on the prior MRI in 2015.   02/05/2015 Imaging abdomen MRI showed 2 hemangioma, no suspicion for metastatic disease. CT chest was negative    HISTORY OF PRESENTING ILLNESS:  Lindsay Little 49 y.o. female is here because of evaluation for management for newly diagnosed rectal adenocarcinoma.   On 09/06/13, she presented to Dr. Starr Sinclair [facility]] with 39-monthhistory of diarrhea and worsening abdominal pain. She has a history of chronic abdominal pain and had been [place] Dr[place] HAmedeo Plenty Abdominal [place]/6/15 noted to have some hypoechoicity in the left hepatic lobe  which was confirmed on MRI to be two hemangiomas  in the right hepatic lobe of measuring up to 3.0 cm along with an additional 7 mm probable cyst.   She presented with worsening of diarrhea for 6 month and was reevaluated by Dr. HAmedeo Plenty She was seen on 12/06/14 at which time it was decided she would undergo HIDA scan given that the pain was intermittently in the RUQ in addition to upper and lower endoscopy. HIDA scan 12/15/14 was reassuring, but she was found to have a malignant tumor of the rectum about 12-18 cm proximal to the anus, gastritis, gastric polyps on endoscopies 01/13/15. Biopsies for these three specimens along with the duodenum were collected and resulted on 01/17/15 for invasive adenocarcinoma, chronic gastritis [H. Pylori negative], fundic gland polyps, and non-specific mild patchy intra-epithelial lymphocytes. Endoscopic [place]7/6/16 staged the mass at T3N2Mx and she is here today for consideration of neoadjuvant therapy.    Today, she does report abdominal pain but is no different than her baseline [right-sided, upper, lower] but has had worsening appetite over the last 2 months. She does have diarrhea with bowel movements every 1-2 hours, especially after eating, and has been noticing bleeding with her bowel movement following her endoscopic evaluation. She also feels her energy level has decreased. Otherwise, she denies nausea, chest pain, shortness of breath.    Family history is notable for a maternal uncle who had pancreatic cancer diagnosed in his mid to late 661sthat spread to the colon and prostate before he died about 139years later and a sister with non-melanoma skin cancer. She works as a patient care  advocate with [employer] and lives at home with her husband and two teenagers. She smoked roughly 20 pack years [1 pack/week x 7 years] in the past and reports occasional alcohol use but denies any prior illicit drug use.  She lost about 7 lbs in the past 2 months  CURRENT THERAPY: Pending  concurrent chemoradiation, with capecitabine 2554m in the morning and 2000 mg in the evening, on the day of radiation (8287mm2 bid)    INTERIM HISTORY DeHilda Bladeseturns for follow-up. She has seen Dr. MoLisbeth Renshawand is scheduled for simulation tomorrow. She still has moderate diarrhea, bowel movement 3-7 times a day, small amount, and intermittent rectal bleeding. Low abdominal discomfort, no significant pain nausea or other new symptoms.   MEDICAL HISTORY:  Past Medical History  Diagnosis Date  . Hypertension   . Hypercholesteremia   . Obesity   . Tear of left rotator cuff   . IBS (irritable bowel syndrome)   . Vertigo   . TMJ (dislocation of temporomandibular joint)   . Bulging disc     L5  . Mild sleep apnea   . Depression   . Rectal bleeding   . Vitamin D deficiency   . Osteoarthritis, knee     SURGICAL HISTORY: Past Surgical History  Procedure Laterality Date  . Rotator cuff repair    . Eus N/A 01/18/2015    Procedure: LOWER ENDOSCOPIC ULTRASOUND (EUS);  Surgeon: WiArta SilenceMD;  [facility];  Service: Endoscopy;  Laterality: N/A;  . Abdominal hysterectomy  1996    Removed uterus, and tubes    SOCIAL HISTORY: History   Social History  . Marital Status: Married    Spouse Name: N/A  . Number of Children: N/A  . Years of Education: N/A   Occupational History  . Not on file.   Social History Main Topics  . Smoking status: Former SmResearch scientist (life sciences). Smokeless tobacco: Not on file     Comment: 1 pack/week intermittently x 7 years  . Alcohol Use: 0.6 oz/week    1 Shots of liquor per week  . Drug Use: Not on file  . Sexual Activity: Not on file   Other Topics Concern  . Not on file   Social History Narrative   Tobacco Use: Cigarettes - Former Smoker   Alcohol: Yes, very rare, liquor.    No recreational drug use   Occupation: Head CMA @ [employer] Marital Status: Married    Husband: JaRoselyn Reefisabled   Children: 2 adopted kids MaBig Pool Religion:  [employer] [place]: Family History  Problem Relation Age of Onset  . Coronary artery disease Mother 6060. Hypertension Mother   . Hyperlipidemia Mother   . Diabetes Mellitus I Mother   . Coronary artery disease Father   . Hyperlipidemia Father   . Hypertension Father   . Cancer Sister     skin - non melanoma  . Hyperlipidemia Brother   . Cancer Maternal Uncle 6530  pancreatic with mets to colon and prostate  . Cirrhosis Maternal Uncle   . Hypertension Maternal Grandmother   . Diabetes Mellitus I Maternal Grandmother   . Hyperlipidemia Maternal Grandmother   . CVA Maternal Grandmother   . Hypertension Maternal Grandfather   . Coronary artery disease Maternal Grandfather 65  . Hyperlipidemia Maternal Grandfather   . Coronary  artery disease Paternal Grandmother   . Hypertension Paternal Grandmother   . Hyperlipidemia Paternal Grandmother   . Diabetes Mellitus I Paternal Grandmother   . Hypertension Paternal Grandfather   . Hyperlipidemia Paternal Grandfather   . Coronary artery disease Paternal Grandfather     ALLERGIES:  is allergic to caine-1; penicillins; and sulfa antibiotics.  MEDICATIONS:  Current Outpatient Prescriptions  Medication Sig Dispense Refill  . belladona alk-PHENObarbital (DONNATAL) 16.2 MG tablet Take 1 tablet by mouth as needed.    . Coenzyme Q10 (COQ-10) 100 MG CAPS Take 100 mg by mouth daily.    . DULoxetine (CYMBALTA) 60 MG capsule Take 60 mg by mouth daily.    Marland Kitchen esomeprazole (NEXIUM) 40 MG capsule Take 40 mg by mouth 2 (two) times daily.    . furosemide (LASIX) 20 MG tablet Take 20 mg by mouth as needed.    . hydrochlorothiazide (HYDRODIURIL) 25 MG tablet Take 25 mg by mouth daily.    Marland Kitchen HYDROcodone-acetaminophen (NORCO/VICODIN) 5-325 MG per tablet Take 1 tablet by mouth as needed for moderate pain.    . hyoscyamine (LEVBID) 0.375 MG 12 hr tablet Take 0.375 mg by mouth 2 (two) times daily.    Marland Kitchen  loperamide (IMODIUM) 2 MG capsule Take by mouth as needed for diarrhea or loose stools.    . meloxicam (MOBIC) 15 MG tablet Take 15 mg by mouth daily.    . methocarbamol (ROBAXIN) 500 MG tablet Take 500 mg by mouth as needed for muscle spasms.    . nebivolol (BYSTOLIC) 5 MG tablet Take 5 mg by mouth daily.    . rosuvastatin (CRESTOR) 5 MG tablet Take 5 mg by mouth daily.    . Vitamin D, Ergocalciferol, (DRISDOL) 50000 UNITS CAPS capsule Take 50,000 Units by mouth 2 (two) times a week.    . metroNIDAZOLE (FLAGYL) 500 MG tablet   0  . neomycin (MYCIFRADIN) 500 MG tablet   0   No current facility-administered medications for this visit.    REVIEW OF SYSTEMS:   Constitutional: Denies fevers, chills or abnormal night sweats Eyes: Denies blurriness of vision, double vision or watery eyes Ears, nose, mouth, throat, and face: Denies mucositis or sore throat Respiratory: Denies cough, dyspnea or wheezes Cardiovascular: Denies palpitation, chest discomfort or lower extremity swelling Gastrointestinal:  Diarrhea, abdominal pain, hematochezia Skin: Denies abnormal skin rashes Lymphatics: Denies new lymphadenopathy or easy bruising Neurological:Denies numbness, tingling or new weaknesses Behavioral/Psych: Mood is stable, no new changes  All other systems were reviewed with the patient and are negative.  PHYSICAL EXAMINATION: ECOG PERFORMANCE STATUS: 1 - Symptomatic but completely ambulatory  Filed Vitals:   02/09/15 0849  BP: 146/59  Pulse: 67  Temp: 98.4 F (36.9 C)  Resp: 18   Filed Weights   02/09/15 0849  Weight: 342 lb 8 oz (155.357 kg)    GENERAL:alert, no distress and comfortable SKIN: skin color, texture, turgor are normal, no rashes or significant lesions EYES: normal, conjunctiva are pink and non-injected, sclera clear OROPHARYNX:no exudate, no erythema and lips, buccal mucosa, and tongue normal  NECK: supple, thyroid normal size, non-tender, without nodularity LYMPH:  no  palpable lymphadenopathy in the cervical, axillary or inguinal LUNGS: clear to auscultation and percussion with normal breathing effort HEART: regular rate & rhythm and no murmurs and no lower extremity edema ABDOMEN:abdomen soft, normal bowel sounds, mildly tender to the RUQ with deep palpation. Pt declined rectal exam.  Musculoskeletal:no cyanosis of digits and no clubbing  PSYCH: alert & oriented  x 3 with fluent speech NEURO: no focal motor/sensory deficits  LABORATORY DATA:  I have reviewed the data as listed CBC Latest Ref Rng 02/09/2015 09/12/2009  WBC 3.9 - 10.3 10e3/uL 10.5(H) 9.9  Hemoglobin 11.6 - 15.9 g/dL 11.3(L) 12.3  Hematocrit 34.8 - 46.6 % 35.7 36.4  Platelets 145 - 400 10e3/uL 326 216    CMP Latest Ref Rng 02/09/2015 09/12/2009  Glucose 70 - 140 mg/dl 143(H) 95  BUN 7.0 - 26.0 mg/dL 10.7 9  Creatinine 0.6 - 1.1 mg/dL 0.7 0.65  Sodium 136 - 145 mEq/L 140 137  Potassium 3.5 - 5.1 mEq/L 4.1 4.1  Chloride 96 - 112 mEq/L - 101  CO2 22 - 29 mEq/L 27 28  Calcium 8.4 - 10.4 mg/dL 9.8 9.3  Total Protein 6.4 - 8.3 g/dL 6.9 -  Total Bilirubin 0.20 - 1.20 mg/dL 0.48 -  Alkaline Phos 40 - 150 U/L 71 -  AST 5 - 34 U/L 12 -  ALT 0 - 55 U/L 12 -     Diagnosis 01/13/2015 1. Duodenum, Biopsy - MILD PATCHY INCREASE IN INTRAEPITHELIAL LYMPHOCYTES, SEE COMMENT. - NO DYSPLASIA OR MALIGNANCY. 2. Stomach, polyp(s), gastric - FUNDIC GLAND POLYPS. - NO INTESTINAL METAPLASIA, DYSPLASIA, OR MALIGNANCY. 3. Stomach, biopsy, gastric antrum - CHRONIC GASTRITIS. - NEGATIVE FOR HELICOBACTER PYLORI. - NO INTESTINAL METAPLASIA, DYSPLASIA, OR MALIGNANCY. 4. Rectum, biopsy, rectal mass - INVASIVE ADENOCARCINOMA. Microscopic Comment 1. There is a patchy mild increase in intraepithelial lymphocytes, including at the tips of villi. However, there is no significant villous blunting. This pattern is non-specific and can be seen in celiac disease, NSAID injury, H. pylori infection, etc. 3. A  Warthin-Starry stain is performed to determine the possibility of the presence of Helicobacter pylori. The Warthin-Starry stain is negative for organisms of Helicobacter pylori. 4. Dr. Saralyn Pilar has reviewed the case. The case was called to Dr. Amedeo Plenty' office on 01/17/2015.   RADIOGRAPHIC STUDIES: I have personally reviewed the radiological images as listed and agreed with the findings in the report.  Ct Abdomen Pelvis W Contrast 01/20/2015    IMPRESSION: Right lateral rectal wall exophytic mass likely corresponding to the known neoplasm.  Two ill-defined hepatic hypo enhancing lesions which appears to correspond to the lesions seen on the prior MRI. Please note evaluation of the liver is very limited due to streak artifact. If there is clinical concern for new metastatic disease MRI may provide better evaluation.  Small left ovarian cystic lesion corresponding to the lesion reported on the prior pelvic ultrasound. Ultrasound may provide better evaluation of the pelvic structures.   Electronically Signed   By: Anner Crete M.D.   On: 01/20/2015 16:15   Lower endoscopic ultrasound by Dr. Paulita Fujita 01/18/2015 FINDINGS: Normal digital rectal exam; could not palpate mass. In proximal rectum extending to the distal rectum, from 12 cm to 20 cm from the anal verge, a partial circumferential (50-75% circumferential distally, 75-100% circumferential proximall) mass was seen. Mass was firm, fixed, ulcerated, friable. EUS radial scope was able to traverse the tumor into normal-appearing distal sigmoid colon. Lesion invaded through the wall of the rectum into neighboring connective tissues in multiple regions. There were several round hypoechoic well-defined malignant-appearing lymph nodes in the vicinity of the proximal portion of the tumor. STAGING: T3 N2 Mx via rectal ultrasound ENDOSCOPIC IMPRESSION: As above. Rectosigmoid mass, biopsy-proven adenocarcinoma, with local invasion noted under  ultrasound. RECOMMENDATIONS: 1. Watch for potential complications of procedure. 2. Needs CT chest, abdomen, pelvis to complete staging. 3.  Based already on rectal ultrasound results, patient would not be candidate for upfront surgery. She will need surgical and radiation oncology consultations. 4. Will discuss with Dr. Amedeo Plenty.  CT chest 02/07/2015 IMPRESSION: 1. No findings of metastatic disease to the chest.  Abdomen MRI w and wo contrast 02/05/2015  IMPRESSION: 1. Small cavernous hemangiomas in segments 5 and 7 of the liver redemonstrated, as above. Additional 7 mm indeterminate lesion is also favored to represent a tiny cavernous hemangioma, but may alternatively represent a small cyst. No new suspicious appearing hepatic lesions to suggest presence of metastatic disease at this time. 2. Mild hepatic steatosis.   ASSESSMENT & PLAN: Lindsay Little is a 49 year old female with chronic abdominal pain with rectal mass on colonoscopy found to be invasive rectal adenocarcinoma.   1. T3N2M0, Stage IIIB/C rectal adenocarcinoma -We reviewed with the patient her biopsy and imaging results along with records sent to [place] from her GI doctor. We explained to her the significance of her tumor markers, CEA and CA 19-9, and their low levels as favorable.  -I reviewed her CT chest and abdomen MRI scan results, which showed no evidence of distant metastasis. Liver lesions are consistent with hemangioma.  -she has stage IIIc disease. Given the locally advanced tumor, we recommended undergoing neoadjuvant therapy with either 5-FU or capecitabine followed by surgical management and adjuvant chemotherapy.  --Chemotherapy consent: Side effects including but does not not limited to, fatigue, nausea, vomiting, diarrhea, hair loss, neuropathy, fluid retention, renal and kidney dysfunction, neutropenic fever, needed for blood transfusion, bleeding, coronary artery spasm and heart attack, were discussed with patient  in great detail. She agrees to proceed. She opted capecitabine . I will send prescription to [facility] today -She was seen by Dr. Lisbeth Renshaw for radiation and Dr. Johney Maine for surgery -will check her tumor for MMR  2. Microcytic anemia: Likely in the setting of ongoing blood loss from her malignancy. Hb 11.7, MCV 80.0 when last checked on 01/17/15, down from Hb 13.1, MCV 86.5 on 07/27/12.  -Check labs today -I recommend her to take oral iron pill 1-2 a day    3. Diarrhea: Likely related to her cancer.  -continue Imodium -I also send a prescription of Lomotil to her from Saturday  Plan: -capecitabine 2549m in the morning and 2000 mg in the evening, on the day of radiation -I'll see her back on the first day of radiation August 8, weekly labs and a visit during the concurrent chemoradiation   I spent 30 mins for the consultation, >50% face to face discussion.   FTruitt Merle 02/09/2015

## 2015-02-09 NOTE — Telephone Encounter (Signed)
Faxed script for Xeloda to [facility].

## 2015-02-09 NOTE — Telephone Encounter (Signed)
Gave and pritned appt sched and avs for pt for July Aug and SEpt

## 2015-02-10 ENCOUNTER — Ambulatory Visit
Admission: RE | Admit: 2015-02-10 | Discharge: 2015-02-10 | Disposition: A | Discharge status: Home / Self Care | Payer: BLUE CROSS/BLUE SHIELD | Source: Ambulatory Visit | Attending: Radiation Oncology | Admitting: Radiation Oncology

## 2015-02-10 ENCOUNTER — Other Ambulatory Visit: Payer: Self-pay | Admitting: Hematology

## 2015-02-10 ENCOUNTER — Encounter: Payer: Self-pay | Admitting: Hematology

## 2015-02-10 DIAGNOSIS — C20 Malignant neoplasm of rectum: Secondary | ICD-10-CM

## 2015-02-10 DIAGNOSIS — Z510 Encounter for antineoplastic radiation therapy: Secondary | ICD-10-CM | POA: Diagnosis not present

## 2015-02-10 MED ORDER — HYDROCODONE-ACETAMINOPHEN 5-325 MG PO TABS: 1.0000 | ORAL_TABLET | Freq: Four times a day (QID) | ORAL | Status: DC | PRN

## 2015-02-10 MED ORDER — HYDROCORTISONE 2.5 % RE CREA: 1.0000 "application " | TOPICAL_CREAM | Freq: Two times a day (BID) | RECTAL | Status: DC

## 2015-02-13 ENCOUNTER — Encounter: Payer: Self-pay | Admitting: Cardiology

## 2015-02-13 ENCOUNTER — Telehealth: Payer: Self-pay | Admitting: *Deleted

## 2015-02-13 NOTE — Telephone Encounter (Signed)
Oncology Nurse Navigator Documentation  Oncology Nurse Navigator Flowsheets 02/13/2015  Referral date to RadOnc/MedOnc -  Navigator Encounter Type Telephone- 1 week F/U  Patient Visit Type -  Treatment Phase Treatment preparation-starts 02/20/15  Barriers/Navigation Needs Family concerns--meds  Education   Interventions Coordination of Care-have collaborative nurse f/u on Xeloda status/co pay  Coordination of Care Needs Lomotil and Zofran called to her [facility]  Education Method -  Support Groups/Services -  Time Spent with Patient 10  Called to f/u on preparation for her chemo/RT on 02/20/15-has not heard anything on her Xeloda. Also reports MD was to call in Lomotil and Zofran for her.

## 2015-02-14 ENCOUNTER — Other Ambulatory Visit: Payer: Self-pay | Admitting: *Deleted

## 2015-02-14 ENCOUNTER — Other Ambulatory Visit: Payer: Self-pay | Admitting: Hematology

## 2015-02-14 DIAGNOSIS — C20 Malignant neoplasm of rectum: Secondary | ICD-10-CM

## 2015-02-14 MED ORDER — ONDANSETRON HCL 8 MG PO TABS: 8.0000 mg | ORAL_TABLET | Freq: Three times a day (TID) | ORAL | Status: DC | PRN

## 2015-02-17 ENCOUNTER — Telehealth: Payer: Self-pay | Admitting: *Deleted

## 2015-02-17 ENCOUNTER — Ambulatory Visit
Admission: RE | Admit: 2015-02-17 | Discharge: 2015-02-17 | Disposition: A | Discharge status: Home / Self Care | Payer: BLUE CROSS/BLUE SHIELD | Source: Ambulatory Visit | Attending: Radiation Oncology | Admitting: Radiation Oncology

## 2015-02-17 DIAGNOSIS — Z510 Encounter for antineoplastic radiation therapy: Secondary | ICD-10-CM | POA: Diagnosis not present

## 2015-02-17 DIAGNOSIS — C20 Malignant neoplasm of rectum: Secondary | ICD-10-CM

## 2015-02-17 NOTE — Progress Notes (Signed)
Patient education done  On port film day, my business card, Radiation and you therapy and you book,, and a sitz bath given to patient, discussed ways to manage  Side effects,symptons to report, nausea, vomiting, diarrhea, skin irritation.,pain, urinary and bladder changes, has lomotil and imodium on hand, use sitz bath prn to sooth bottom once becomes sore/irritated, low fiber diet with diarrhea, no fast foods,fried ,greasy or spicy foods during this time, also may need to eat 5-6 smaller meals with snacks in between instead oof 3 large meals, drink plenty water, stay hydrated, take nausea  med as needed, has zofran on hand, increase protein in diet, verbal understanding, keep bottom dry, no eurcerin ointment on bottom, teach back given, offered for patient to watch video on radiation , she declined will watch at home has the ct sim paper to watch also gave her [email or web address] link  2:46 PM

## 2015-02-17 NOTE — Telephone Encounter (Signed)
Oncology Nurse Navigator Documentation  Oncology Nurse Navigator Flowsheets 02/17/2015  Referral date to RadOnc/MedOnc -  Navigator Encounter Type Telephone  Patient Visit Type -  Treatment Phase Treatment-preparation  Barriers/Navigation Needs No barriers at this time  Education -  Interventions -  Coordination of Care -  Education Method -  Support Groups/Services -  Time Spent with Patient -  Confirmed with Lindsay Little that she has her Xeloda, Lomotil and Zofran on hand.  Starts tx 02/20/15.

## 2015-02-20 ENCOUNTER — Ambulatory Visit
Admission: RE | Admit: 2015-02-20 | Discharge: 2015-02-20 | Disposition: A | Discharge status: Home / Self Care | Payer: BLUE CROSS/BLUE SHIELD | Source: Ambulatory Visit | Attending: Radiation Oncology | Admitting: Radiation Oncology

## 2015-02-20 ENCOUNTER — Other Ambulatory Visit (HOSPITAL_BASED_OUTPATIENT_CLINIC_OR_DEPARTMENT_OTHER): Payer: BLUE CROSS/BLUE SHIELD

## 2015-02-20 ENCOUNTER — Ambulatory Visit (HOSPITAL_BASED_OUTPATIENT_CLINIC_OR_DEPARTMENT_OTHER): Payer: BLUE CROSS/BLUE SHIELD | Admitting: Hematology

## 2015-02-20 ENCOUNTER — Encounter: Payer: Self-pay | Admitting: Hematology

## 2015-02-20 VITALS — BP 137/66 | HR 65 | Temp 99.1°F | Resp 18 | Ht 71.0 in | Wt 340.4 lb

## 2015-02-20 DIAGNOSIS — Z510 Encounter for antineoplastic radiation therapy: Secondary | ICD-10-CM | POA: Diagnosis not present

## 2015-02-20 DIAGNOSIS — R197 Diarrhea, unspecified: Secondary | ICD-10-CM

## 2015-02-20 DIAGNOSIS — C20 Malignant neoplasm of rectum: Secondary | ICD-10-CM | POA: Diagnosis not present

## 2015-02-20 DIAGNOSIS — D649 Anemia, unspecified: Secondary | ICD-10-CM | POA: Insufficient documentation

## 2015-02-20 DIAGNOSIS — D500 Iron deficiency anemia secondary to blood loss (chronic): Secondary | ICD-10-CM

## 2015-02-20 LAB — COMPREHENSIVE METABOLIC PANEL (CC13)
ALT: 13 U/L (ref 0–55)
AST: 10 U/L (ref 5–34)
Albumin: 3.2 g/dL — ABNORMAL LOW (ref 3.5–5.0)
Alkaline Phosphatase: 66 U/L (ref 40–150)
Anion Gap: 6 mEq/L (ref 3–11)
BILIRUBIN TOTAL: 0.36 mg/dL (ref 0.20–1.20)
BUN: 10.9 mg/dL (ref 7.0–26.0)
CALCIUM: 10.1 mg/dL (ref 8.4–10.4)
CO2: 29 mEq/L (ref 22–29)
Chloride: 104 mEq/L (ref 98–109)
Creatinine: 0.7 mg/dL (ref 0.6–1.1)
GLUCOSE: 114 mg/dL (ref 70–140)
Potassium: 3.6 mEq/L (ref 3.5–5.1)
Sodium: 139 mEq/L (ref 136–145)
Total Protein: 6.7 g/dL (ref 6.4–8.3)

## 2015-02-20 LAB — CBC WITH DIFFERENTIAL/PLATELET
BASO%: 0.7 % (ref 0.0–2.0)
Basophils Absolute: 0.1 10*3/uL (ref 0.0–0.1)
EOS ABS: 0.6 10*3/uL — AB (ref 0.0–0.5)
EOS%: 5.5 % (ref 0.0–7.0)
HCT: 33.7 % — ABNORMAL LOW (ref 34.8–46.6)
HGB: 10.9 g/dL — ABNORMAL LOW (ref 11.6–15.9)
LYMPH#: 1.9 10*3/uL (ref 0.9–3.3)
LYMPH%: 17.3 % (ref 14.0–49.7)
MCH: 25.4 pg (ref 25.1–34.0)
MCHC: 32.2 g/dL (ref 31.5–36.0)
MCV: 79 fL — ABNORMAL LOW (ref 79.5–101.0)
MONO#: 0.6 10*3/uL (ref 0.1–0.9)
MONO%: 5.6 % (ref 0.0–14.0)
NEUT#: 7.6 10*3/uL — ABNORMAL HIGH (ref 1.5–6.5)
NEUT%: 70.9 % (ref 38.4–76.8)
PLATELETS: 337 10*3/uL (ref 145–400)
RBC: 4.27 10*6/uL (ref 3.70–5.45)
RDW: 14.9 % — AB (ref 11.2–14.5)
WBC: 10.8 10*3/uL — ABNORMAL HIGH (ref 3.9–10.3)

## 2015-02-20 NOTE — Progress Notes (Signed)
[facility]  Telephone:([phone]) [phone] Fax:([phone]) [phone]  Clinic New Consult Note   Patient Care Team: [name], MD as PCP - General (Family Medicine) Michael Boston, MD as Consulting Physician (General Surgery) Truitt Merle, MD as Consulting Physician (Medical Oncology) Kyung Rudd, MD as Consulting Physician (Radiation Oncology) Teena Irani, MD as Consulting Physician (Gastroenterology) 02/20/2015  CHIEF COMPLAINTS/PURPOSE OF CONSULTATION:  Follow up rectal cancer   Oncology History   Rectal adenocarcinoma   Staging form: Colon and Rectum, AJCC 7th Edition     Clinical: T3, N2, M0 - Unsigned       Rectal adenocarcinoma   01/13/2015 Initial Diagnosis Rectal adenocarcinoma   01/13/2015 Procedure Colonoscopy showed a sensitivity O nonobstructing mass in the rectum and from 12-18 cm proximal to the Annis. The mass was circumferential, measuring about 6 cm in length. EGD was negative.   01/17/2015 Tumor Marker CEA 1.4, CA-19-9 8    01/18/2015 Procedure Lower endoscopic ultrasound by Dr. Paulita Fujita showed a T3 N2 rectal mass.   01/20/2015 Imaging CT abdomen and pelvis with contrast showed right lateral rectal wall exophytic mass, and 2 ill-defined hepatic hypoenhancing lesions which appears to correspond to the lesion seen on the prior MRI in 2015.   02/05/2015 Imaging abdomen MRI showed 2 hemangioma, no suspicion for metastatic disease. CT chest was negative   02/20/2015 -  Radiation Therapy neoadjuvant RT to rectal cancer    02/20/2015 Concurrent Chemotherapy capecitabine 2500 mg in the morning and 2000 mg in the evening (836m/m2, bid), on the day of radiation    HISTORY OF PRESENTING ILLNESS:  Lindsay ADOLPH49y.o. female is here because of evaluation for management for newly diagnosed rectal adenocarcinoma.   On 09/06/13, she presented to Dr. HStarr SinclairGI] with 437-monthistory of diarrhea and worsening abdominal pain. She has a history of chronic abdominal pain and had been  previously diagnosed [place] by Dr. Ha[place]medeo PlentyAbdominal [place] noted to have some hypoechoicity in the left hepatic lobe which was confirmed on MRI to be two hemangiomas  in the right hepatic lobe of measuring up to 3.0 cm along with an additional 7 mm probable cyst.   She presented with worsening of diarrhea for 6 month and was reevaluated by Dr. HaAmedeo PlentyShe was seen on 12/06/14 at which time it was decided she would undergo HIDA scan given that the pain was intermittently in the RUQ in addition to upper and lower endoscopy. HIDA scan 12/15/14 was reassuring, but she was found to have a malignant tumor of the rectum about 12-18 cm proximal to the anus, gastritis, gastric polyps on endoscopies 01/13/15. Biopsies for these three specimens along with the duodenum were collected and resulted on 01/17/15 for invasive adenocarcinoma, chronic gastritis [H. Pylori negative], fundic gland polyps, and non-specific mild patchy intra-epithelial lymphocytes. Endoscopic [place]16 staged the mass at T3N2Mx and she is here today for consideration of neoadjuvant therapy.    Today, she does report abdominal pain but is no different than her baseline [right-sided, upper, lower] but has had worsening appetite over the last 2 months. She does have diarrhea with bowel movements every 1-2 hours, especially after eating, and has been noticing bleeding with her bowel movement following her endoscopic evaluation. She also feels her energy level has decreased. Otherwise, she denies nausea, chest pain, shortness of breath.    Family history is notable for a maternal uncle who  had pancreatic cancer diagnosed in his mid to late 60s that spread to the colon and prostate before he died about 10 years later and a sister with non-melanoma skin cancer. 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 She smoked  roughly 20 pack years [1 pack/week x 7 years] in the past and reports occasional alcohol use but denies any prior illicit drug use.  She lost about 7 lbs in the past 2 months  CURRENT THERAPY: concurrent chemoradiation, with capecitabine 255m in the morning and 2000 mg in the evening, on the day of radiation (8295mm2 bid), started on 02/20/2015   INTERIM HISTORY DeHilda Bladeseturns for follow-up and initiating concurrent chemotherapy and radiation. She still has significant diarrhea, she takes Imodium, and has 7-8 bowel movements a day. The hematochezia is stable, small amount blood, no blood clots, perirectal pain is also stable, she does not take much pain medication also her husband thinks she probably needs it. No fever or chills. She is eating well. She has started Xeloda this morning, and tolerated the first as well.  MEDICAL HISTORY:  Past Medical History  Diagnosis Date  . Hypertension   . Hypercholesteremia   . Obesity   . Tear of left rotator cuff   . IBS (irritable bowel syndrome)   . Vertigo   . TMJ (dislocation of temporomandibular joint)   . Bulging disc     L5  . Mild sleep apnea   . Depression   . Rectal bleeding   . Vitamin D deficiency   . Osteoarthritis, knee     SURGICAL HISTORY: Past Surgical History  Procedure Laterality Date  . Rotator cuff repair    . Eus N/A 01/18/2015    Procedure: LOWER ENDOSCOPIC ULTRASOUND (EUS);  Surgeon: WiArta SilenceMD;  [facility];  Service: Endoscopy;  Laterality: N/A;  . Abdominal hysterectomy  1996    Removed uterus, and tubes    SOCIAL HISTORY: History   Social History  . Marital Status: Married    Spouse Name: N/A  . Number of Children: N/A  . Years of Education: N/A   Occupational History  . Not on file.   Social History Main Topics  . Smoking status: Former SmResearch scientist (life sciences). Smokeless tobacco: Not on file     Comment: 1 pack/week intermittently x 7 years  . Alcohol Use: 0.6 oz/week    1 Shots of liquor per  week  . Drug Use: Not on file  . Sexual Activity: Not on file   Other Topics Concern  . Not on file   Social History Narrative   Tobacco Use: Cigarettes - Former Smoker   Alcohol: Yes, very rare, liquor.    No recreational drug use   Occupation: Head CMA @ [employer] Marital Status: Married    Husband: JaRoselyn Reefisabled   Children: 2 adopted kids [place] [employer] [place] Family History  Problem Relation Age of Onset  . Coronary artery disease Mother [id number]. Hypertension Mother   . Hyperlipidemia Mother   . Diabetes Mellitus I Mother   . Coronary artery disease Father   . Hyperlipidemia Father   . Hypertension Father   . Cancer Sister     skin - non melanoma  . Hyperlipidemia Brother   .  Cancer Maternal Uncle 60    pancreatic with mets to colon and prostate  . Cirrhosis Maternal Uncle   . Hypertension Maternal Grandmother   . Diabetes Mellitus I Maternal Grandmother   . Hyperlipidemia Maternal Grandmother   . CVA Maternal Grandmother   . Hypertension Maternal Grandfather   . Coronary artery disease Maternal Grandfather 65  . Hyperlipidemia Maternal Grandfather   . Coronary artery disease Paternal Grandmother   . Hypertension Paternal Grandmother   . Hyperlipidemia Paternal Grandmother   . Diabetes Mellitus I Paternal Grandmother   . Hypertension Paternal Grandfather   . Hyperlipidemia Paternal Grandfather   . Coronary artery disease Paternal Grandfather     ALLERGIES:  is allergic to caine-1; penicillins; and sulfa antibiotics.  MEDICATIONS:  Current Outpatient Prescriptions  Medication Sig Dispense Refill  . belladona alk-PHENObarbital (DONNATAL) 16.2 MG tablet Take 1 tablet by mouth as needed.    . capecitabine (XELODA) 500 MG tablet Take 5 tab in the morning and 4 tab in the evening, after meals, on the day of radiation (off on weekend). 270 tablet 0  . Coenzyme Q10 (COQ-10) 100 MG CAPS  Take 100 mg by mouth daily.    . DULoxetine (CYMBALTA) 60 MG capsule Take 60 mg by mouth daily.    Marland Kitchen esomeprazole (NEXIUM) 40 MG capsule Take 40 mg by mouth 2 (two) times daily.    . hydrochlorothiazide (HYDRODIURIL) 25 MG tablet Take 25 mg by mouth daily.    . hydrocortisone (ANUSOL-HC) 2.5 % rectal cream Place 1 application rectally 2 (two) times daily. 30 g 0  . hyoscyamine (LEVBID) 0.375 MG 12 hr tablet Take 0.375 mg by mouth 2 (two) times daily.    Marland Kitchen loperamide (IMODIUM) 2 MG capsule Take by mouth as needed for diarrhea or loose stools.    . meloxicam (MOBIC) 15 MG tablet Take 15 mg by mouth daily.    . methocarbamol (ROBAXIN) 500 MG tablet Take 500 mg by mouth as needed for muscle spasms.    . metroNIDAZOLE (FLAGYL) 500 MG tablet   0  . nebivolol (BYSTOLIC) 5 MG tablet Take 5 mg by mouth daily.    . rosuvastatin (CRESTOR) 5 MG tablet Take 5 mg by mouth daily.    . Vitamin D, Ergocalciferol, (DRISDOL) 50000 UNITS CAPS capsule Take 50,000 Units by mouth 2 (two) times a week.    . diphenoxylate-atropine (LOMOTIL) 2.5-0.025 MG per tablet Take 1 tablet by mouth 4 (four) times daily as needed for diarrhea or loose stools. (Patient not taking: Reported on 02/20/2015) 30 tablet 2  . furosemide (LASIX) 20 MG tablet Take 20 mg by mouth as needed.    Marland Kitchen HYDROcodone-acetaminophen (NORCO/VICODIN) 5-325 MG per tablet Take 1 tablet by mouth every 6 (six) hours as needed for moderate pain. (Patient not taking: Reported on 02/20/2015) 60 tablet 0  . neomycin (MYCIFRADIN) 500 MG tablet   0  . ondansetron (ZOFRAN) 8 MG tablet Take 1 tablet (8 mg total) by mouth every 8 (eight) hours as needed for nausea. (Patient not taking: Reported on 02/20/2015) 20 tablet 3   No current facility-administered medications for this visit.    REVIEW OF SYSTEMS:   Constitutional: Denies fevers, chills or abnormal night sweats Eyes: Denies blurriness of vision, double vision or watery eyes Ears, nose, mouth, throat, and face:  Denies mucositis or sore throat Respiratory: Denies cough, dyspnea or wheezes Cardiovascular: Denies palpitation, chest discomfort or lower extremity swelling Gastrointestinal:  Diarrhea, abdominal pain, hematochezia Skin: Denies abnormal skin  rashes Lymphatics: Denies new lymphadenopathy or easy bruising Neurological:Denies numbness, tingling or new weaknesses Behavioral/Psych: Mood is stable, no new changes  All other systems were reviewed with the patient and are negative.  PHYSICAL EXAMINATION: ECOG PERFORMANCE STATUS: 1 - Symptomatic but completely ambulatory  Filed Vitals:   02/20/15 1003  BP: 137/66  Pulse: 65  Temp: 99.1 F (37.3 C)  Resp: 18   Filed Weights   02/20/15 1003  Weight: 340 lb 6.4 oz (154.404 kg)    GENERAL:alert, no distress and comfortable SKIN: skin color, texture, turgor are normal, no rashes or significant lesions EYES: normal, conjunctiva are pink and non-injected, sclera clear OROPHARYNX:no exudate, no erythema and lips, buccal mucosa, and tongue normal  NECK: supple, thyroid normal size, non-tender, without nodularity LYMPH:  no palpable lymphadenopathy in the cervical, axillary or inguinal LUNGS: clear to auscultation and percussion with normal breathing effort HEART: regular rate & rhythm and no murmurs and no lower extremity edema ABDOMEN:abdomen soft, normal bowel sounds, mildly tender to the RUQ with deep palpation. Pt declined rectal exam.  Musculoskeletal:no cyanosis of digits and no clubbing  PSYCH: alert & oriented x 3 with fluent speech NEURO: no focal motor/sensory deficits  LABORATORY DATA:  I have reviewed the data as listed CBC Latest Ref Rng 02/20/2015 02/09/2015 09/12/2009  WBC 3.9 - 10.3 10e3/uL 10.8(H) 10.5(H) 9.9  Hemoglobin 11.6 - 15.9 g/dL 10.9(L) 11.3(L) 12.3  Hematocrit 34.8 - 46.6 % 33.7(L) 35.7 36.4  Platelets 145 - 400 10e3/uL 337 326 216    CMP Latest Ref Rng 02/20/2015 02/09/2015 09/12/2009  Glucose 70 - 140 mg/dl 114  143(H) 95  BUN 7.0 - 26.0 mg/dL 10.9 10.7 9  Creatinine 0.6 - 1.1 mg/dL 0.7 0.7 0.65  Sodium 136 - 145 mEq/L 139 140 137  Potassium 3.5 - 5.1 mEq/L 3.6 4.1 4.1  Chloride 96 - 112 mEq/L - - 101  CO2 22 - 29 mEq/L 29 27 28   Calcium 8.4 - 10.4 mg/dL 10.1 9.8 9.3  Total Protein 6.4 - 8.3 g/dL 6.7 6.9 -  Total Bilirubin 0.20 - 1.20 mg/dL 0.36 0.48 -  Alkaline Phos 40 - 150 U/L 66 71 -  AST 5 - 34 U/L 10 12 -  ALT 0 - 55 U/L 13 12 -     Diagnosis 01/13/2015 1. Duodenum, Biopsy - MILD PATCHY INCREASE IN INTRAEPITHELIAL LYMPHOCYTES, SEE COMMENT. - NO DYSPLASIA OR MALIGNANCY. 2. Stomach, polyp(s), gastric - FUNDIC GLAND POLYPS. - NO INTESTINAL METAPLASIA, DYSPLASIA, OR MALIGNANCY. 3. Stomach, biopsy, gastric antrum - CHRONIC GASTRITIS. - NEGATIVE FOR HELICOBACTER PYLORI. - NO INTESTINAL METAPLASIA, DYSPLASIA, OR MALIGNANCY. 4. Rectum, biopsy, rectal mass - INVASIVE ADENOCARCINOMA. Microscopic Comment 1. There is a patchy mild increase in intraepithelial lymphocytes, including at the tips of villi. However, there is no significant villous blunting. This pattern is non-specific and can be seen in celiac disease, NSAID injury, H. pylori infection, etc. 3. A Warthin-Starry stain is performed to determine the possibility of the presence of Helicobacter pylori. The Warthin-Starry stain is negative for organisms of Helicobacter pylori. 4. Dr. Saralyn Pilar has reviewed the case. The case was called to Dr. Amedeo Plenty' office on 01/17/2015.   RADIOGRAPHIC STUDIES: I have personally reviewed the radiological images as listed and agreed with the findings in the report.  Ct Abdomen Pelvis W Contrast 01/20/2015    IMPRESSION: Right lateral rectal wall exophytic mass likely corresponding to the known neoplasm.  Two ill-defined hepatic hypo enhancing lesions which appears to correspond to the lesions  seen on the prior MRI. Please note evaluation of the liver is very limited due to streak artifact. If there is  clinical concern for new metastatic disease MRI may provide better evaluation.  Small left ovarian cystic lesion corresponding to the lesion reported on the prior pelvic ultrasound. Ultrasound may provide better evaluation of the pelvic structures.   Electronically Signed   By: Anner Crete M.D.   On: 01/20/2015 16:15   Lower endoscopic ultrasound by Dr. Paulita Fujita 01/18/2015 FINDINGS: Normal digital rectal exam; could not palpate mass. In proximal rectum extending to the distal rectum, from 12 cm to 20 cm from the anal verge, a partial circumferential (50-75% circumferential distally, 75-100% circumferential proximall) mass was seen. Mass was firm, fixed, ulcerated, friable. EUS radial scope was able to traverse the tumor into normal-appearing distal sigmoid colon. Lesion invaded through the wall of the rectum into neighboring connective tissues in multiple regions. There were several round hypoechoic well-defined malignant-appearing lymph nodes in the vicinity of the proximal portion of the tumor. STAGING: T3 N2 Mx via rectal ultrasound ENDOSCOPIC IMPRESSION: As above. Rectosigmoid mass, biopsy-proven adenocarcinoma, with local invasion noted under ultrasound. RECOMMENDATIONS: 1. Watch for potential complications of procedure. 2. Needs CT chest, abdomen, pelvis to complete staging. 3. Based already on rectal ultrasound results, patient would not be candidate for upfront surgery. She will need surgical and radiation oncology consultations. 4. Will discuss with Dr. Amedeo Plenty.  CT chest 02/07/2015 IMPRESSION: 1. No findings of metastatic disease to the chest.  Abdomen MRI w and wo contrast 02/05/2015  IMPRESSION: 1. Small cavernous hemangiomas in segments 5 and 7 of the liver redemonstrated, as above. Additional 7 mm indeterminate lesion is also favored to represent a tiny cavernous hemangioma, but may alternatively represent a small cyst. No new suspicious appearing hepatic lesions to suggest  presence of metastatic disease at this time. 2. Mild hepatic steatosis.   ASSESSMENT & PLAN: Ms. Karim is a 49 year old female with chronic abdominal pain with rectal mass on colonoscopy found to be invasive rectal adenocarcinoma.   1. T3N2M0, Stage IIIB/C rectal adenocarcinoma -We reviewed with the patient her biopsy and imaging results along with records sent to [place] from her GI doctor. We explained to her the significance of her tumor markers, CEA and CA 19-9, and their low levels as favorable.  -I reviewed her CT chest and abdomen MRI scan results, which showed no evidence of distant metastasis. Liver lesions are consistent with hemangioma.  -she has stage IIIc disease. Given the locally advanced tumor, we recommended undergoing neoadjuvant therapy with either 5-FU or capecitabine followed by surgical management and adjuvant chemotherapy.  -She is starting concurrent chemoradiation with capecitabine today, lab reviewed, adequate for treatment.  2. Microcytic anemia, secondary to GI blood loss and iron deficiency anemia - Likely in the setting of ongoing blood loss from her malignancy. Hb 11.7, MCV 80.0 when last checked on 01/17/15, down from Hb 13.1, MCV 86.5 on 07/27/12.  -Her arms today showed a ferritin of 28, serum iron 40, saturation 12%. Consistent with mild iron deficiency. I recommend her to take over-the-counter ferrous sulfate 1-2 tablets a day, constipation reviewed with her. She agrees. -We discussed if she is not able to tolerate oral iron, we'll consider IV Feraheme.   3. Diarrhea:  related to her cancer.  -continue Imodium -use Lomotil as needed   Plan: -starting capecitabine 2524m in the morning and 2000 mg in the evening, on the day of radiation today  -She is scheduled for  weekly labs and a follow-up with me or our nurse practitioner Lattie Haw    I spent 20 mins for the visit, >50% face to face discussion.   Truitt Merle  02/20/2015

## 2015-02-21 ENCOUNTER — Ambulatory Visit
Admission: RE | Admit: 2015-02-21 | Discharge: 2015-02-21 | Disposition: A | Discharge status: Home / Self Care | Payer: BLUE CROSS/BLUE SHIELD | Source: Ambulatory Visit | Attending: Radiation Oncology | Admitting: Radiation Oncology

## 2015-02-21 DIAGNOSIS — Z510 Encounter for antineoplastic radiation therapy: Secondary | ICD-10-CM | POA: Diagnosis not present

## 2015-02-21 LAB — CEA

## 2015-02-22 ENCOUNTER — Ambulatory Visit
Admission: RE | Admit: 2015-02-22 | Discharge: 2015-02-22 | Disposition: A | Discharge status: Home / Self Care | Payer: BLUE CROSS/BLUE SHIELD | Source: Ambulatory Visit | Attending: Radiation Oncology | Admitting: Radiation Oncology

## 2015-02-22 DIAGNOSIS — Z510 Encounter for antineoplastic radiation therapy: Secondary | ICD-10-CM | POA: Diagnosis not present

## 2015-02-23 ENCOUNTER — Encounter: Payer: Self-pay | Admitting: Radiation Oncology

## 2015-02-23 ENCOUNTER — Ambulatory Visit
Admission: RE | Admit: 2015-02-23 | Discharge: 2015-02-23 | Disposition: A | Discharge status: Home / Self Care | Payer: BLUE CROSS/BLUE SHIELD | Source: Ambulatory Visit | Attending: Radiation Oncology | Admitting: Radiation Oncology

## 2015-02-23 VITALS — BP 131/76 | HR 75 | Temp 97.8°F | Ht 71.0 in | Wt 341.3 lb

## 2015-02-23 DIAGNOSIS — C20 Malignant neoplasm of rectum: Secondary | ICD-10-CM

## 2015-02-23 DIAGNOSIS — Z510 Encounter for antineoplastic radiation therapy: Secondary | ICD-10-CM | POA: Diagnosis not present

## 2015-02-23 NOTE — Progress Notes (Signed)
Lindsay Little has received 3 fractions to her rectum.  She reports that she has burining pain at a level 5/10 in the pelvic/ rectal region after only having three treatments..  She is using proto-cream as ordered by Dr. Burr Medico with "some" relief. She states that she has some intermittent hesistancy in urinary stream and denies.  Intermittent periods of loose stools.

## 2015-02-23 NOTE — Progress Notes (Signed)
  Radiation Oncology         [phone]   Name: Lindsay Little MRN: [id number]   Date: 02/23/2015  DOB: May 23, 1966   Weekly Radiation Therapy Management    ICD-9-CM ICD-10-CM   1. Rectal adenocarcinoma 154.1 C20     Current Dose: 5.4 Gy  Planned Dose:  45 Gy  Narrative The patient presents for routine under treatment assessment. She reports that she has burining pain at a level 5/10 in the pelvic/ rectal region after only having three treatments. She is using proto-cream as ordered by Dr. Burr Medico with "some" relief. She states that she has some intermittent hesistancy in urinary stream and denies. Intermittent periods of loose stools. Reports nausea 1.5 hours after taking Xeloda. Reports that she feels like carrying "30 lbs of rocks" in her abdomen. Set-up films were reviewed. The chart was checked.  Physical Findings  height is 5' 11"  (1.803 m) and weight is 341 lb 4.8 oz (154.813 kg). Her temperature is 97.8 F (36.6 C). Her blood pressure is 131/76 and her pulse is 75. . Weight essentially stable.  No significant changes.  Impression The patient is tolerating radiation.  Plan Continue treatment as planned. I advised the pt to drink plenty of fluids to stay regular.   This document serves as a record of services personally performed by Tyler Pita, MD. It was created on his behalf by Darcus Austin, a trained medical scribe. The creation of this record is based on the scribe's personal observations and the provider's statements to them. This document has been checked and approved by the attending provider.      Lindsay Little, M.D.

## 2015-02-24 ENCOUNTER — Ambulatory Visit
Admission: RE | Admit: 2015-02-24 | Discharge: 2015-02-24 | Disposition: A | Discharge status: Home / Self Care | Payer: BLUE CROSS/BLUE SHIELD | Source: Ambulatory Visit | Attending: Radiation Oncology | Admitting: Radiation Oncology

## 2015-02-24 DIAGNOSIS — Z510 Encounter for antineoplastic radiation therapy: Secondary | ICD-10-CM | POA: Diagnosis not present

## 2015-02-27 ENCOUNTER — Ambulatory Visit (HOSPITAL_BASED_OUTPATIENT_CLINIC_OR_DEPARTMENT_OTHER): Payer: BLUE CROSS/BLUE SHIELD | Admitting: Nurse Practitioner

## 2015-02-27 ENCOUNTER — Ambulatory Visit
Admission: RE | Admit: 2015-02-27 | Discharge: 2015-02-27 | Disposition: A | Discharge status: Home / Self Care | Payer: BLUE CROSS/BLUE SHIELD | Source: Ambulatory Visit | Attending: Radiation Oncology | Admitting: Radiation Oncology

## 2015-02-27 ENCOUNTER — Other Ambulatory Visit (HOSPITAL_BASED_OUTPATIENT_CLINIC_OR_DEPARTMENT_OTHER): Payer: BLUE CROSS/BLUE SHIELD

## 2015-02-27 VITALS — BP 106/58 | HR 67 | Temp 99.1°F | Resp 17 | Ht 71.0 in | Wt 346.6 lb

## 2015-02-27 DIAGNOSIS — K922 Gastrointestinal hemorrhage, unspecified: Secondary | ICD-10-CM

## 2015-02-27 DIAGNOSIS — D500 Iron deficiency anemia secondary to blood loss (chronic): Secondary | ICD-10-CM

## 2015-02-27 DIAGNOSIS — R197 Diarrhea, unspecified: Secondary | ICD-10-CM | POA: Diagnosis not present

## 2015-02-27 DIAGNOSIS — Z510 Encounter for antineoplastic radiation therapy: Secondary | ICD-10-CM | POA: Diagnosis not present

## 2015-02-27 DIAGNOSIS — C20 Malignant neoplasm of rectum: Secondary | ICD-10-CM

## 2015-02-27 DIAGNOSIS — R110 Nausea: Secondary | ICD-10-CM

## 2015-02-27 LAB — COMPREHENSIVE METABOLIC PANEL (CC13)
ALT: 11 U/L (ref 0–55)
ANION GAP: 9 meq/L (ref 3–11)
AST: 9 U/L (ref 5–34)
Albumin: 3.1 g/dL — ABNORMAL LOW (ref 3.5–5.0)
Alkaline Phosphatase: 57 U/L (ref 40–150)
BILIRUBIN TOTAL: 0.28 mg/dL (ref 0.20–1.20)
BUN: 11 mg/dL (ref 7.0–26.0)
CHLORIDE: 107 meq/L (ref 98–109)
CO2: 23 mEq/L (ref 22–29)
Calcium: 9.2 mg/dL (ref 8.4–10.4)
Creatinine: 0.7 mg/dL (ref 0.6–1.1)
GLUCOSE: 89 mg/dL (ref 70–140)
Potassium: 3.7 mEq/L (ref 3.5–5.1)
SODIUM: 140 meq/L (ref 136–145)
Total Protein: 6.5 g/dL (ref 6.4–8.3)

## 2015-02-27 LAB — CBC WITH DIFFERENTIAL/PLATELET
BASO%: 0.6 % (ref 0.0–2.0)
Basophils Absolute: 0 10*3/uL (ref 0.0–0.1)
EOS%: 3.7 % (ref 0.0–7.0)
Eosinophils Absolute: 0.3 10*3/uL (ref 0.0–0.5)
HCT: 30.4 % — ABNORMAL LOW (ref 34.8–46.6)
HGB: 9.8 g/dL — ABNORMAL LOW (ref 11.6–15.9)
LYMPH%: 12.2 % — AB (ref 14.0–49.7)
MCH: 25.8 pg (ref 25.1–34.0)
MCHC: 32.4 g/dL (ref 31.5–36.0)
MCV: 79.5 fL (ref 79.5–101.0)
MONO#: 0.4 10*3/uL (ref 0.1–0.9)
MONO%: 5.7 % (ref 0.0–14.0)
NEUT%: 77.8 % — AB (ref 38.4–76.8)
NEUTROS ABS: 5.9 10*3/uL (ref 1.5–6.5)
Platelets: 374 10*3/uL (ref 145–400)
RBC: 3.82 10*6/uL (ref 3.70–5.45)
RDW: 14.9 % — ABNORMAL HIGH (ref 11.2–14.5)
WBC: 7.6 10*3/uL (ref 3.9–10.3)
lymph#: 0.9 10*3/uL (ref 0.9–3.3)

## 2015-02-27 NOTE — Progress Notes (Signed)
[facility] OFFICE PROGRESS NOTE   Diagnosis:   rectal cancer   Oncology History   Rectal adenocarcinoma  Staging form: Colon and Rectum, AJCC 7th Edition  Clinical: T3, N2, M0 - Unsigned       Rectal adenocarcinoma   01/13/2015 Initial Diagnosis Rectal adenocarcinoma   01/13/2015 Procedure Colonoscopy showed a nonobstructing mass in the rectum and from 12-18 cm proximal to the anus. The mass was circumferential, measuring about 6 cm in length. EGD was negative.   01/17/2015 Tumor Marker CEA 1.4, CA-19-9 8    01/18/2015 Procedure Lower endoscopic ultrasound by Dr.  Paulita Fujita showed a T3 N2 rectal mass.   01/20/2015 Imaging CT abdomen and pelvis with contrast showed right lateral rectal wall exophytic mass, and 2 ill-defined hepatic hypoenhancing lesions which appears to correspond to the lesion seen on the prior MRI in 2015.   02/05/2015 Imaging abdomen MRI showed 2 hemangioma, no suspicion for metastatic disease. CT chest was negative   02/20/2015 -  Radiation Therapy neoadjuvant RT to rectal cancer    02/20/2015 Concurrent Chemotherapy capecitabine 2500 mg in the morning and 2000 mg in the evening (854m/m2, bid), on the day of radiation         INTERVAL HISTORY:   Ms. HSpoffordreturns as scheduled. She began radiation and concurrent Xeloda 02/20/2015. She has intermittent nausea. She takes Zofran with good relief. At times she notes a headache following Zofran. She had a tiny blister her mouth that has resolved. She continues to have small frequent loose stools with blood. No hand or foot pain or redness. She notes a "pressure" at the lower abdomen and rectum. She reports good fluid intake. Appetite varies. She has not started oral iron.  Objective:  Vital signs in last 24 hours:  Blood pressure 106/58, pulse 67, temperature 99.1 F (37.3 C), temperature source Oral, resp. rate 17, height 5' 11"  (1.803 m), weight 346 lb 9.6 oz (157.217  kg), SpO2 99 %.    HEENT: No thrush or ulcers. Resp: Lungs clear bilaterally. Cardio: Regular rate and rhythm. GI: Abdomen soft with mild diffuse tenderness over the right abdomen. No hepatomegaly. Vascular: No leg edema. Calves nontender. Skin: Palms without erythema.    Lab Results:  Lab Results  Component Value Date   WBC 7.6 02/27/2015   HGB 9.8* 02/27/2015   HCT 30.4* 02/27/2015   MCV 79.5 02/27/2015   PLT 374 02/27/2015   NEUTROABS 5.9 02/27/2015    Imaging:  No results found.  Medications: I have reviewed the patient's current medications.  Assessment/Plan: T3N2M0, Stage IIIB/C rectal adenocarcinoma -We reviewed with the patient her biopsy and imaging results along [place]. We explained to her the significance of her tumor markers, CEA and CA 19-9, and their low levels as favorable.  -CT chest and abdomen MRI scan results, which showed no evidence of distant metastasis. Liver lesions are consistent with hemangioma.  -she has stage IIIc disease. Given the locally advanced tumor, we recommended undergoing neoadjuvant therapy with either 5-FU or capecitabine followed by surgical management and adjuvant chemotherapy.  -She began concurrent chemoradiation with capecitabine on 02/20/2015 2. Microcytic anemia, secondary to GI blood loss and iron deficiency anemia - Likely in the setting of ongoing blood loss from her malignancy. Recommended she take over-the-counter ferrous sulfate 1-2 tablets a day.  -We discussed if she is not able to tolerate oral iron, we'll consider IV Feraheme. -Hemoglobin lower 02/27/2015. She has not started  oral iron. Recommended that she begin.   3. Diarrhea: related to her cancer. She will continue Imodium and Lomotil.   Disposition: Lindsay Little appears stable. She continues radiation and concurrent Xeloda. She is having mild intermittent nausea, taking Zofran as needed. She occasionally notes a headache  after the Zofran. She declines a different anti-emetic at this time.   We discussed that the hemoglobin is lower. She will begin oral iron.  We will see her in follow-up in one week with repeat labs. She will contact the office in the interim with any problems.    Ned Card ANP/GNP-BC   02/27/2015  2:28 PM

## 2015-02-28 ENCOUNTER — Ambulatory Visit
Admission: RE | Admit: 2015-02-28 | Discharge: 2015-02-28 | Disposition: A | Discharge status: Home / Self Care | Payer: BLUE CROSS/BLUE SHIELD | Source: Ambulatory Visit | Attending: Radiation Oncology | Admitting: Radiation Oncology

## 2015-02-28 DIAGNOSIS — Z510 Encounter for antineoplastic radiation therapy: Secondary | ICD-10-CM | POA: Diagnosis not present

## 2015-03-01 ENCOUNTER — Ambulatory Visit
Admission: RE | Admit: 2015-03-01 | Discharge: 2015-03-01 | Disposition: A | Discharge status: Home / Self Care | Payer: BLUE CROSS/BLUE SHIELD | Source: Ambulatory Visit | Attending: Radiation Oncology | Admitting: Radiation Oncology

## 2015-03-01 DIAGNOSIS — Z510 Encounter for antineoplastic radiation therapy: Secondary | ICD-10-CM | POA: Diagnosis not present

## 2015-03-02 ENCOUNTER — Ambulatory Visit
Admission: RE | Admit: 2015-03-02 | Discharge: 2015-03-02 | Disposition: A | Discharge status: Home / Self Care | Payer: BLUE CROSS/BLUE SHIELD | Source: Ambulatory Visit | Attending: Radiation Oncology | Admitting: Radiation Oncology

## 2015-03-02 DIAGNOSIS — Z510 Encounter for antineoplastic radiation therapy: Secondary | ICD-10-CM | POA: Diagnosis not present

## 2015-03-03 ENCOUNTER — Encounter: Payer: Self-pay | Admitting: Radiation Oncology

## 2015-03-03 ENCOUNTER — Ambulatory Visit
Admission: RE | Admit: 2015-03-03 | Discharge: 2015-03-03 | Disposition: A | Discharge status: Home / Self Care | Payer: BLUE CROSS/BLUE SHIELD | Source: Ambulatory Visit | Attending: Radiation Oncology | Admitting: Radiation Oncology

## 2015-03-03 DIAGNOSIS — C20 Malignant neoplasm of rectum: Secondary | ICD-10-CM

## 2015-03-03 DIAGNOSIS — Z510 Encounter for antineoplastic radiation therapy: Secondary | ICD-10-CM | POA: Diagnosis not present

## 2015-03-03 NOTE — Progress Notes (Signed)
Weekly rad txs  10/25 completed, nauseated and diarrhea (10-20 EPISODES)   Has blood in stools, taking iron now, suggested to take wwith oj, takes zofran and lomotil and imodium,  zofran helps the nausea, on Xeloda bid , having a lot of gas/cramping in abdomen,  Uses baby wipes, not using sitz bath , 11:23 AM BP 130/61 mmHg  Pulse 64  Temp(Src) 98.2 F (36.8 C) (Oral)  Resp 20  Wt 346 lb 1.6 oz (156.99 kg)  Wt Readings from Last 3 Encounters:  03/03/15 346 lb 1.6 oz (156.99 kg)  02/27/15 346 lb 9.6 oz (157.217 kg)  02/23/15 341 lb 4.8 oz (154.813 kg)

## 2015-03-03 NOTE — Progress Notes (Signed)
  Radiation Oncology         [phone] ________________________________  Name: Lindsay Little MRN: [id number]  Date: 02/10/2015  DOB: [birth year]  Optical Surface Tracking Plan:  Since intensity modulated radiotherapy (IMRT) and 3D conformal radiation treatment methods are predicated on accurate and precise positioning for treatment, intrafraction motion monitoring is medically necessary to ensure accurate and safe treatment delivery.  The ability to quantify intrafraction motion without excessive ionizing radiation dose can only be performed with optical surface tracking. Accordingly, surface imaging offers the opportunity to obtain 3D measurements of patient position throughout IMRT and 3D treatments without excessive radiation exposure.  I am ordering optical surface tracking for this patient's upcoming course of radiotherapy. ________________________________  Kyung Rudd, MD 03/03/2015 4:28 PM    Reference:   Ursula Alert, J, et al. Surface imaging-based analysis of intrafraction motion for breast radiotherapy patients.Journal of [place], n. 6, nov. 2014. ISSN [id number].   Available at: <http://www.jacmp.org/index.php/jacmp/article/view/4957>.

## 2015-03-03 NOTE — Addendum Note (Signed)
Encounter addended by: Kyung Rudd, MD on: 03/03/2015  4:29 PM<BR>     Documentation filed: Notes Section, Visit Diagnoses

## 2015-03-03 NOTE — Progress Notes (Signed)
  Radiation Oncology         [phone] ________________________________  Name: Lindsay Little MRN: [id number]  Date: 02/10/2015  DOB: 1965/10/11  Diagnosis:     ICD-9-CM ICD-10-CM   1. Rectal adenocarcinoma 154.1 C20      SIMULATION AND TREATMENT PLANNING NOTE  The patient presented for simulation for the patient's upcoming course of radiation for the diagnosis of rectal cancer. The patient was placed in a prone position. A belly-board was set-up for the patient to spare bowel to the maximum extent. A customized vac-lock bag was constructed to also aid in patient immobilization. This complex treatment device will be used on a daily basis during the treatment. In this fashion a CT scan was obtained through the pelvic region and the isocenter was placed near midline within the pelvis. Surface markings were placed.  The patient's imaging was loaded into the radiation treatment planning system. The patient will initially be planned to receive a course of radiation to a dose of 45 Gy. This will be accomplished in 25 fractions at 1.8 gray per fraction. This initial treatment will correspond to a 3-D conformal technique. The target volume has been contoured in addition to the rectum, bladder and femoral heads. Dose volume histograms of each of these structures have been requested and these will be carefully reviewed as part of the 3-D conformal treatment planning process. To accomplish this initial treatment, 4 customized blocks have been designed for this purpose. Each of these 4 complex treatment devices will be used on a daily basis during the initial course of the treatment. It is anticipated that the patient will then receive a boost for an additional 5.4 Gy. The anticipated total dose therefore will be 50.4 Gy.    Special treatment procedure The patient will receive chemotherapy during the course of radiation treatment. The patient may experience increased or overlapping toxicity due to this  combined-modality approach and the patient will be monitored for such problems. This may include extra lab work as necessary. This therefore constitutes a special treatment procedure.    ________________________________  Jodelle Gross, MD, PhD

## 2015-03-03 NOTE — Progress Notes (Signed)
Department of Radiation Oncology  Phone:  [phone] Fax:        [phone]  Weekly Treatment Note    Name: Lindsay Little Date: 03/03/2015 MRN: [id number] DOB: [birth year]   Current dose: 18 Gy  Current fraction:10   MEDICATIONS: Current Outpatient Prescriptions  Medication Sig Dispense Refill  . belladona alk-PHENObarbital (DONNATAL) 16.2 MG tablet Take 1 tablet by mouth as needed.    . capecitabine (XELODA) 500 MG tablet Take 5 tab in the morning and 4 tab in the evening, after meals, on the day of radiation (off on weekend). 270 tablet 0  . Coenzyme Q10 (COQ-10) 100 MG CAPS Take 100 mg by mouth daily.    . diphenoxylate-atropine (LOMOTIL) 2.5-0.025 MG per tablet Take 1 tablet by mouth 4 (four) times daily as needed for diarrhea or loose stools. 30 tablet 2  . DULoxetine (CYMBALTA) 60 MG capsule Take 60 mg by mouth daily.    Marland Kitchen esomeprazole (NEXIUM) 40 MG capsule Take 40 mg by mouth 2 (two) times daily.    . furosemide (LASIX) 20 MG tablet Take 20 mg by mouth as needed.    . hydrochlorothiazide (HYDRODIURIL) 25 MG tablet Take 25 mg by mouth daily.    Marland Kitchen HYDROcodone-acetaminophen (NORCO/VICODIN) 5-325 MG per tablet Take 1 tablet by mouth every 6 (six) hours as needed for moderate pain. 60 tablet 0  . hydrocortisone (ANUSOL-HC) 2.5 % rectal cream Place 1 application rectally 2 (two) times daily. 30 g 0  . hyoscyamine (LEVBID) 0.375 MG 12 hr tablet Take 0.375 mg by mouth 2 (two) times daily.    Marland Kitchen loperamide (IMODIUM) 2 MG capsule Take by mouth as needed for diarrhea or loose stools.    . meloxicam (MOBIC) 15 MG tablet Take 15 mg by mouth daily.    . methocarbamol (ROBAXIN) 500 MG tablet Take 500 mg by mouth as needed for muscle spasms.    . metroNIDAZOLE (FLAGYL) 500 MG tablet   0  . nebivolol (BYSTOLIC) 5 MG tablet Take 5 mg by mouth daily.    Marland Kitchen neomycin (MYCIFRADIN) 500 MG tablet   0  . ondansetron (ZOFRAN) 8 MG tablet Take 1 tablet (8 mg total) by mouth every 8 (eight)  hours as needed for nausea. 20 tablet 3  . rosuvastatin (CRESTOR) 5 MG tablet Take 5 mg by mouth daily.    . Vitamin D, Ergocalciferol, (DRISDOL) 50000 UNITS CAPS capsule Take 50,000 Units by mouth 2 (two) times a week.     No current facility-administered medications for this encounter.     ALLERGIES: Caine-1; Penicillins; and Sulfa antibiotics   LABORATORY DATA:  Lab Results  Component Value Date   WBC 7.6 02/27/2015   HGB 9.8* 02/27/2015   HCT 30.4* 02/27/2015   MCV 79.5 02/27/2015   PLT 374 02/27/2015   Lab Results  Component Value Date   NA 140 02/27/2015   K 3.7 02/27/2015   CL 101 09/12/2009   CO2 23 02/27/2015   Lab Results  Component Value Date   ALT 11 02/27/2015   AST 9 02/27/2015   ALKPHOS 57 02/27/2015   BILITOT 0.28 02/27/2015     NARRATIVE: Lindsay Little was seen today for weekly treatment management. The chart was checked and the patient's films were reviewed.  Weekly rad txs  10/25 completed, nauseated and diarrhea (10-20 EPISODES)   Has blood in stools, taking iron now, suggested to take wwith oj, takes zofran and lomotil and imodium,  zofran helps the nausea,  on Xeloda bid , having a lot of gas/cramping in abdomen,  Uses baby wipes, not using sitz bath , 12:34 PM There were no vitals taken for this visit.  Wt Readings from Last 3 Encounters:  03/03/15 346 lb 1.6 oz (156.99 kg)  02/27/15 346 lb 9.6 oz (157.217 kg)  02/23/15 341 lb 4.8 oz (154.813 kg)    PHYSICAL EXAMINATION: vitals were not taken for this visit.     Alert, in no acute distress.  ASSESSMENT: The patient is doing satisfactorily with treatment.  PLAN: We will continue with the patient's radiation treatment as planned.

## 2015-03-06 ENCOUNTER — Encounter: Payer: Self-pay | Admitting: *Deleted

## 2015-03-06 ENCOUNTER — Ambulatory Visit (HOSPITAL_BASED_OUTPATIENT_CLINIC_OR_DEPARTMENT_OTHER): Payer: BLUE CROSS/BLUE SHIELD | Admitting: Hematology

## 2015-03-06 ENCOUNTER — Ambulatory Visit
Admission: RE | Admit: 2015-03-06 | Discharge: 2015-03-06 | Disposition: A | Discharge status: Home / Self Care | Payer: BLUE CROSS/BLUE SHIELD | Source: Ambulatory Visit | Attending: Radiation Oncology | Admitting: Radiation Oncology

## 2015-03-06 ENCOUNTER — Other Ambulatory Visit (HOSPITAL_BASED_OUTPATIENT_CLINIC_OR_DEPARTMENT_OTHER): Payer: BLUE CROSS/BLUE SHIELD

## 2015-03-06 ENCOUNTER — Encounter: Payer: Self-pay | Admitting: Hematology

## 2015-03-06 VITALS — BP 135/63 | HR 74 | Temp 98.7°F | Resp 18 | Ht 71.0 in | Wt 343.3 lb

## 2015-03-06 DIAGNOSIS — D500 Iron deficiency anemia secondary to blood loss (chronic): Secondary | ICD-10-CM

## 2015-03-06 DIAGNOSIS — K922 Gastrointestinal hemorrhage, unspecified: Secondary | ICD-10-CM

## 2015-03-06 DIAGNOSIS — D509 Iron deficiency anemia, unspecified: Secondary | ICD-10-CM | POA: Diagnosis not present

## 2015-03-06 DIAGNOSIS — Z510 Encounter for antineoplastic radiation therapy: Secondary | ICD-10-CM | POA: Diagnosis not present

## 2015-03-06 DIAGNOSIS — R197 Diarrhea, unspecified: Secondary | ICD-10-CM

## 2015-03-06 DIAGNOSIS — C20 Malignant neoplasm of rectum: Secondary | ICD-10-CM | POA: Diagnosis not present

## 2015-03-06 LAB — COMPREHENSIVE METABOLIC PANEL (CC13)
ALBUMIN: 3.2 g/dL — AB (ref 3.5–5.0)
ALK PHOS: 58 U/L (ref 40–150)
ALT: 17 U/L (ref 0–55)
ANION GAP: 9 meq/L (ref 3–11)
AST: 16 U/L (ref 5–34)
BUN: 8.9 mg/dL (ref 7.0–26.0)
CALCIUM: 9.8 mg/dL (ref 8.4–10.4)
CHLORIDE: 104 meq/L (ref 98–109)
CO2: 28 mEq/L (ref 22–29)
Creatinine: 0.7 mg/dL (ref 0.6–1.1)
EGFR: >90 mL/min/{1.73_m2} (ref >90)
Glucose: 95 mg/dl (ref 70–140)
POTASSIUM: 4.2 meq/L (ref 3.5–5.1)
Sodium: 141 mEq/L (ref 136–145)
Total Bilirubin: 0.34 mg/dL (ref 0.20–1.20)
Total Protein: 6.5 g/dL (ref 6.4–8.3)

## 2015-03-06 LAB — CBC WITH DIFFERENTIAL/PLATELET
BASO%: 0.5 % (ref 0.0–2.0)
Basophils Absolute: 0 10*3/uL (ref 0.0–0.1)
EOS ABS: 0.3 10*3/uL (ref 0.0–0.5)
EOS%: 5 % (ref 0.0–7.0)
HCT: 30.5 % — ABNORMAL LOW (ref 34.8–46.6)
HEMOGLOBIN: 9.8 g/dL — AB (ref 11.6–15.9)
LYMPH#: 0.6 10*3/uL — AB (ref 0.9–3.3)
LYMPH%: 10.7 % — ABNORMAL LOW (ref 14.0–49.7)
MCH: 26 pg (ref 25.1–34.0)
MCHC: 32.2 g/dL (ref 31.5–36.0)
MCV: 80.8 fL (ref 79.5–101.0)
MONO#: 0.3 10*3/uL (ref 0.1–0.9)
MONO%: 5.4 % (ref 0.0–14.0)
NEUT#: 4.2 10*3/uL (ref 1.5–6.5)
NEUT%: 78.4 % — AB (ref 38.4–76.8)
PLATELETS: 299 10*3/uL (ref 145–400)
RBC: 3.77 10*6/uL (ref 3.70–5.45)
RDW: 15.5 % — AB (ref 11.2–14.5)
WBC: 5.3 10*3/uL (ref 3.9–10.3)

## 2015-03-06 MED ORDER — PROCHLORPERAZINE MALEATE 10 MG PO TABS: 10.0000 mg | ORAL_TABLET | Freq: Four times a day (QID) | ORAL | Status: DC | PRN

## 2015-03-06 NOTE — Progress Notes (Signed)
Oncology Nurse Navigator Documentation  Oncology Nurse Navigator Flowsheets 03/06/2015  Referral date to RadOnc/MedOnc -  Navigator Encounter Type 2 week Tx-  Patient Visit Type Medonc  Treatment Phase Treatment--RT & Xeloda  Barriers/Navigation Needs No barriers at this time  Education -  Interventions -  Coordination of Care -  Education Method -  Support Groups/Services -  Time Spent with Patient 5  Has decided to stop working at this time due to needing to take pain meds. Reviewed treatment field skin care with her. Will try to increase her ferrous sulfate to twice daily.

## 2015-03-06 NOTE — Progress Notes (Signed)
[facility]  [phone] [phone]  Clinic New Consult Note   Patient Care Team: [name], MD as PCP - General (Family Medicine) Michael Boston, MD as Consulting Physician (General Surgery) Truitt Merle, MD as Consulting Physician (Medical Oncology) Kyung Rudd, MD as Consulting Physician (Radiation Oncology) Teena Irani, MD as Consulting Physician (Gastroenterology) 03/06/2015  CHIEF COMPLAINTS/PURPOSE OF CONSULTATION:  Follow up rectal cancer   Oncology History   Rectal adenocarcinoma   Staging form: Colon and Rectum, AJCC 7th Edition     Clinical: T3, N2, M0 - Unsigned       Rectal adenocarcinoma   01/13/2015 Initial Diagnosis Rectal adenocarcinoma   01/13/2015 Procedure Colonoscopy showed a sensitivity O nonobstructing mass in the rectum and from 12-18 cm proximal to the Annis. The mass was circumferential, measuring about 6 cm in length. EGD was negative.   01/17/2015 Tumor Marker CEA 1.4, CA-19-9 8    01/18/2015 Procedure Lower endoscopic ultrasound by Dr. Paulita Fujita showed a T3 N2 rectal mass.   01/20/2015 Imaging CT abdomen and pelvis with contrast showed right lateral rectal wall exophytic mass, and 2 ill-defined hepatic hypoenhancing lesions which appears to correspond to the lesion seen on the prior MRI in 2015.   02/05/2015 Imaging abdomen MRI showed 2 hemangioma, no suspicion for metastatic disease. CT chest was negative   02/20/2015 -  Radiation Therapy neoadjuvant RT to rectal cancer    02/20/2015 Concurrent Chemotherapy capecitabine 2500 mg in the morning and 2000 mg in the evening (88m/m2, bid), on the day of radiation    HISTORY OF PRESENTING ILLNESS:  Lindsay SONG89y.o. female is here because of evaluation for management for newly diagnosed rectal adenocarcinoma.   On 09/06/13, she presented to Dr. HStarr SinclairGI] with 413-monthistory of diarrhea and worsening abdominal pain. She has a history of chronic abdominal pain and had been  previously [place] by Dr. HaAmedeo PlentyAbdominal [place] noted to have some hypoechoicity in the left hepatic lobe which was confirmed on MRI to be two hemangiomas  in the right hepatic lobe of measuring up to 3.0 cm along with an additional 7 mm probable cyst.   She presented with worsening of diarrhea for 6 month and was reevaluated by Dr. HaAmedeo PlentyShe was seen on 12/06/14 at which time it was decided she would undergo HIDA scan given that the pain was intermittently in the RUQ in addition to upper and lower endoscopy. HIDA scan 12/15/14 was reassuring, but she was found to have a malignant tumor of the rectum about 12-18 cm proximal to the anus, gastritis, gastric polyps on endoscopies 01/13/15. Biopsies for these three specimens along with the duodenum were collected and resulted on 01/17/15 for invasive adenocarcinoma, chronic gastritis [H. Pylori negative], fundic gland polyps, and non-specific mild patchy intra-epithelial lymphocytes. Endoscopic [place]/6[place]16 staged the mass at T3N2Mx and she is here today for consideration of neoadjuvant therapy.    Today, she does report abdominal pain but is no different than her baseline [right-sided, upper, lower] but has had worsening appetite over the last 2 months. She does have diarrhea with bowel movements every 1-2 hours, especially after eating, and has been noticing bleeding with her bowel movement following her endoscopic evaluation. She also feels her energy level has decreased. Otherwise, she denies nausea, chest pain, shortness of breath.    Family history is notable for a maternal uncle who  had pancreatic cancer diagnosed in his mid to late 60s that spread to the colon and prostate before he died about 10 years later and a sister with non-melanoma skin cancer. 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 She smoked  roughly 20 pack years [1 pack/week x 7 years] in the past and reports occasional alcohol use but denies any prior illicit drug use.  She lost about 7 lbs in the past 2 months  CURRENT THERAPY: concurrent chemoradiation, with capecitabine 2584m in the morning and 2000 mg in the evening, on the day of radiation (8265mm2 bid), started on 02/20/2015   INTERIM HISTORY DeHilda Bladeseturns for follow-up and week 3 concurrent chemotherapy and radiation.  She is tolerating the treatment moderately well. She has a mild intermittent nausea, for which she takes Zofran, but her husband feels her nausea is not well controlled. She still has mild diarrhea, with intermittent blood in the stool. She has moderate persistent perianal pain, she started taking Vicodin as needed, but probably not as frequent as she needs. Her appetite is fair, no significant weight loss. She has been off work since she started treatment.   HISTORY:  Past Medical History  Diagnosis Date  . Hypertension   . Hypercholesteremia   . Obesity   . Tear of left rotator cuff   . IBS (irritable bowel syndrome)   . Vertigo   . TMJ (dislocation of temporomandibular joint)   . Bulging disc     L5  . Mild sleep apnea   . Depression   . Rectal bleeding   . Vitamin D deficiency   . Osteoarthritis, knee     SURGICAL HISTORY: Past Surgical History  Procedure Laterality Date  . Rotator cuff repair    . Eus N/A 01/18/2015    Procedure: LOWER ENDOSCOPIC ULTRASOUND (EUS);  Surgeon: WiArta SilenceMD;  [facility];  Service: Endoscopy;  Laterality: N/A;  . Abdominal hysterectomy  1996    Removed uterus, and tubes    SOCIAL HISTORY: Social History   Social History  . Marital Status: Married    Spouse Name: N/A  . Number of Children: N/A  . Years of Education: N/A   Occupational History  . Not on file.   Social History Main Topics  . Smoking status: Former SmResearch scientist (life sciences). Smokeless tobacco: Not on file     Comment: 1 pack/week  intermittently x 7 years  . Alcohol Use: 0.6 oz/week    1 Shots of liquor per week  . Drug Use: Not on file  . Sexual Activity: Not on file   Other Topics Concern  . Not on file   Social History Narrative   Tobacco Use: Cigarettes - Former Smoker   Alcohol: Yes, very rare, liquor.    No recreational drug use   Occupation: Head CMA @ [employer] Marital Status: Married    Husband: JaRoselyn Reefisabled   Children: 2 adopted kids [place] Religion: [employer] [place] Family History  Problem Relation Age of Onset  . Coronary artery disease Mother [id number]. Hypertension Mother   . Hyperlipidemia Mother   . Diabetes Mellitus I Mother   . Coronary artery disease Father   . Hyperlipidemia Father   . Hypertension Father   . Cancer Sister  skin - non melanoma  . Hyperlipidemia Brother   . Cancer Maternal Uncle 84    pancreatic with mets to colon and prostate  . Cirrhosis Maternal Uncle   . Hypertension Maternal Grandmother   . Diabetes Mellitus I Maternal Grandmother   . Hyperlipidemia Maternal Grandmother   . CVA Maternal Grandmother   . Hypertension Maternal Grandfather   . Coronary artery disease Maternal Grandfather 65  . Hyperlipidemia Maternal Grandfather   . Coronary artery disease Paternal Grandmother   . Hypertension Paternal Grandmother   . Hyperlipidemia Paternal Grandmother   . Diabetes Mellitus I Paternal Grandmother   . Hypertension Paternal Grandfather   . Hyperlipidemia Paternal Grandfather   . Coronary artery disease Paternal Grandfather     ALLERGIES:  is allergic to caine-1; penicillins; and sulfa antibiotics.  MEDICATIONS:  Current Outpatient Prescriptions  Medication Sig Dispense Refill  . belladona alk-PHENObarbital (DONNATAL) 16.2 MG tablet Take 1 tablet by mouth as needed.    . capecitabine (XELODA) 500 MG tablet Take 5 tab in the morning and 4 tab in the evening, after meals, on the day of  radiation (off on weekend). 270 tablet 0  . Coenzyme Q10 (COQ-10) 100 MG CAPS Take 100 mg by mouth daily.    . diphenoxylate-atropine (LOMOTIL) 2.5-0.025 MG per tablet Take 1 tablet by mouth 4 (four) times daily as needed for diarrhea or loose stools. 30 tablet 2  . DULoxetine (CYMBALTA) 60 MG capsule Take 60 mg by mouth daily.    Marland Kitchen esomeprazole (NEXIUM) 40 MG capsule Take 40 mg by mouth 2 (two) times daily.    . furosemide (LASIX) 20 MG tablet Take 20 mg by mouth as needed.    . hydrochlorothiazide (HYDRODIURIL) 25 MG tablet Take 25 mg by mouth daily.    Marland Kitchen HYDROcodone-acetaminophen (NORCO/VICODIN) 5-325 MG per tablet Take 1 tablet by mouth every 6 (six) hours as needed for moderate pain. 60 tablet 0  . hydrocortisone (ANUSOL-HC) 2.5 % rectal cream Place 1 application rectally 2 (two) times daily. 30 g 0  . hyoscyamine (LEVBID) 0.375 MG 12 hr tablet Take 0.375 mg by mouth 2 (two) times daily.    Marland Kitchen loperamide (IMODIUM) 2 MG capsule Take by mouth as needed for diarrhea or loose stools.    . meloxicam (MOBIC) 15 MG tablet Take 15 mg by mouth daily.    . methocarbamol (ROBAXIN) 500 MG tablet Take 500 mg by mouth as needed for muscle spasms.    . metroNIDAZOLE (FLAGYL) 500 MG tablet   0  . nebivolol (BYSTOLIC) 5 MG tablet Take 5 mg by mouth daily.    Marland Kitchen neomycin (MYCIFRADIN) 500 MG tablet   0  . ondansetron (ZOFRAN) 8 MG tablet Take 1 tablet (8 mg total) by mouth every 8 (eight) hours as needed for nausea. 20 tablet 3  . rosuvastatin (CRESTOR) 5 MG tablet Take 5 mg by mouth daily.    . Vitamin D, Ergocalciferol, (DRISDOL) 50000 UNITS CAPS capsule Take 50,000 Units by mouth 2 (two) times a week.    . ferrous sulfate 325 (65 FE) MG tablet Take 325 mg by mouth 2 (two) times daily with a meal.    . prochlorperazine (COMPAZINE) 10 MG tablet Take 1 tablet (10 mg total) by mouth every 6 (six) hours as needed. 30 tablet 3   No current facility-administered medications for this visit.    REVIEW OF SYSTEMS:    Constitutional: Denies fevers, chills or abnormal night sweats Eyes: Denies blurriness of vision, double  vision or watery eyes Ears, nose, mouth, throat, and face: Denies mucositis or sore throat Respiratory: Denies cough, dyspnea or wheezes Cardiovascular: Denies palpitation, chest discomfort or lower extremity swelling Gastrointestinal:  Diarrhea, abdominal pain, hematochezia Skin: Denies abnormal skin rashes Lymphatics: Denies new lymphadenopathy or easy bruising Neurological:Denies numbness, tingling or new weaknesses Behavioral/Psych: Mood is stable, no new changes  All other systems were reviewed with the patient and are negative.  PHYSICAL EXAMINATION: ECOG PERFORMANCE STATUS: 1 - Symptomatic but completely ambulatory  Filed Vitals:   03/06/15 1028  BP: 135/63  Pulse: 74  Temp: 98.7 F (37.1 C)  Resp: 18   Filed Weights   03/06/15 1028  Weight: 343 lb 4.8 oz (155.72 kg)    GENERAL:alert, no distress and comfortable SKIN: skin color, texture, turgor are normal, no rashes or significant lesions EYES: normal, conjunctiva are pink and non-injected, sclera clear OROPHARYNX:no exudate, no erythema and lips, buccal mucosa, and tongue normal  NECK: supple, thyroid normal size, non-tender, without nodularity LYMPH:  no palpable lymphadenopathy in the cervical, axillary or inguinal LUNGS: clear to auscultation and percussion with normal breathing effort HEART: regular rate & rhythm and no murmurs and no lower extremity edema ABDOMEN:abdomen soft, normal bowel sounds, mildly tender to the RUQ with deep palpation. Small area of (+) Skin erythema and pigmentation in perianal area, no skin ulcers. Musculoskeletal:no cyanosis of digits and no clubbing  PSYCH: alert & oriented x 3 with fluent speech NEURO: no focal motor/sensory deficits  LABORATORY DATA:  I have reviewed the data as listed CBC Latest Ref Rng 03/06/2015 02/27/2015 02/20/2015  WBC 3.9 - 10.3 10e3/uL 5.3 7.6 10.8(H)    Hemoglobin 11.6 - 15.9 g/dL 9.8(L) 9.8(L) 10.9(L)  Hematocrit 34.8 - 46.6 % 30.5(L) 30.4(L) 33.7(L)  Platelets 145 - 400 10e3/uL 299 374 337    CMP Latest Ref Rng 03/06/2015 02/27/2015 02/20/2015  Glucose 70 - 140 mg/dl 95 89 114  BUN 7.0 - 26.0 mg/dL 8.9 11.0 10.9  Creatinine 0.6 - 1.1 mg/dL 0.7 0.7 0.7  Sodium 136 - 145 mEq/L 141 140 139  Potassium 3.5 - 5.1 mEq/L 4.2 3.7 3.6  Chloride 96 - 112 mEq/L - - -  CO2 22 - 29 mEq/L 28 23 29   Calcium 8.4 - 10.4 mg/dL 9.8 9.2 10.1  Total Protein 6.4 - 8.3 g/dL 6.5 6.5 6.7  Total Bilirubin 0.20 - 1.20 mg/dL 0.34 0.28 0.36  Alkaline Phos 40 - 150 U/L 58 57 66  AST 5 - 34 U/L 16 9 10   ALT 0 - 55 U/L 17 11 13      Diagnosis 01/13/2015 1. Duodenum, Biopsy - MILD PATCHY INCREASE IN INTRAEPITHELIAL LYMPHOCYTES, SEE COMMENT. - NO DYSPLASIA OR MALIGNANCY. 2. Stomach, polyp(s), gastric - FUNDIC GLAND POLYPS. - NO INTESTINAL METAPLASIA, DYSPLASIA, OR MALIGNANCY. 3. Stomach, biopsy, gastric antrum - CHRONIC GASTRITIS. - NEGATIVE FOR HELICOBACTER PYLORI. - NO INTESTINAL METAPLASIA, DYSPLASIA, OR MALIGNANCY. 4. Rectum, biopsy, rectal mass - INVASIVE ADENOCARCINOMA. Microscopic Comment 1. There is a patchy mild increase in intraepithelial lymphocytes, including at the tips of villi. However, there is no significant villous blunting. This pattern is non-specific and can be seen in celiac disease, NSAID injury, H. pylori infection, etc. 3. A Warthin-Starry stain is performed to determine the possibility of the presence of Helicobacter pylori. The Warthin-Starry stain is negative for organisms of Helicobacter pylori. 4. Dr. Saralyn Pilar has reviewed the case. The case was called to Dr. Amedeo Plenty' office on 01/17/2015.   RADIOGRAPHIC STUDIES: I have personally reviewed  the radiological images as listed and agreed with the findings in the report.  Ct Abdomen Pelvis W Contrast 01/20/2015    IMPRESSION: Right lateral rectal wall exophytic mass likely corresponding  to the known neoplasm.  Two ill-defined hepatic hypo enhancing lesions which appears to correspond to the lesions seen on the prior MRI. Please note evaluation of the liver is very limited due to streak artifact. If there is clinical concern for new metastatic disease MRI may provide better evaluation.  Small left ovarian cystic lesion corresponding to the lesion reported on the prior pelvic ultrasound. Ultrasound may provide better evaluation of the pelvic structures.   Electronically Signed   By: Anner Crete M.D.   On: 01/20/2015 16:15   Lower endoscopic ultrasound by Dr. Paulita Fujita 01/18/2015 FINDINGS: Normal digital rectal exam; could not palpate mass. In proximal rectum extending to the distal rectum, from 12 cm to 20 cm from the anal verge, a partial circumferential (50-75% circumferential distally, 75-100% circumferential proximall) mass was seen. Mass was firm, fixed, ulcerated, friable. EUS radial scope was able to traverse the tumor into normal-appearing distal sigmoid colon. Lesion invaded through the wall of the rectum into neighboring connective tissues in multiple regions. There were several round hypoechoic well-defined malignant-appearing lymph nodes in the vicinity of the proximal portion of the tumor. STAGING: T3 N2 Mx via rectal ultrasound ENDOSCOPIC IMPRESSION: As above. Rectosigmoid mass, biopsy-proven adenocarcinoma, with local invasion noted under ultrasound. RECOMMENDATIONS: 1. Watch for potential complications of procedure. 2. Needs CT chest, abdomen, pelvis to complete staging. 3. Based already on rectal ultrasound results, patient would not be candidate for upfront surgery. She will need surgical and radiation oncology consultations. 4. Will discuss with Dr. Amedeo Plenty.  CT chest 02/07/2015 IMPRESSION: 1. No findings of metastatic disease to the chest.  Abdomen MRI w and wo contrast 02/05/2015  IMPRESSION: 1. Small cavernous hemangiomas in segments 5 and 7 of the  liver redemonstrated, as above. Additional 7 mm indeterminate lesion is also favored to represent a tiny cavernous hemangioma, but may alternatively represent a small cyst. No new suspicious appearing hepatic lesions to suggest presence of metastatic disease at this time. 2. Mild hepatic steatosis.   ASSESSMENT & PLAN: Ms. Dade is a 49 year old female with chronic abdominal pain with rectal mass on colonoscopy found to be invasive rectal adenocarcinoma.   1. T3N2M0, Stage IIIB/C rectal adenocarcinoma -We reviewed with the patient her biopsy and imaging results along with records sent to [place] from her GI doctor. We explained to her the significance of her tumor markers, CEA and CA 19-9, and their low levels as favorable.  -I reviewed her CT chest and abdomen MRI scan results, which showed no evidence of distant metastasis. Liver lesions are consistent with hemangioma.  -she has stage IIIc disease. Given the locally advanced tumor, we recommended undergoing neoadjuvant therapy with either 5-FU or capecitabine followed by surgical management and adjuvant chemotherapy.  -She iis tolerating concurrent chemoradiation with some expected side effects, lab reviewed, adequate, we'll continue Xeloda with concurrent radiation. -I encouraged her to use Vicodin as needed for pain control  2. Microcytic anemia, secondary to GI blood loss and iron deficiency anemia - Likely in the setting of ongoing blood loss from her malignancy. Hb 11.7, MCV 80.0 when last checked on 01/17/15, down from Hb 13.1, MCV 86.5 on 07/27/12.  -Her arms today showed a ferritin of 28, serum iron 40, saturation 12%. Consistent with mild iron deficiency. She is taking ferrous sulfate once daily, I  asked her to see if she can tolerate 2 tablets today. -We discussed if she is not able to tolerate oral iron, we'll consider IV Feraheme, she would like to try oral pill first.   3. Diarrhea:  related to her cancer.  -continue Imodium -use  Lomotil as needed   Plan: -continue concurrent chemoradiation with Xeloda, week 3 now  -She is scheduled for weekly labs and a follow-up with me or our nurse practitioner Lattie Haw    I spent 20 mins for the visit, >50% face to face discussion.   Truitt Merle  03/06/2015

## 2015-03-07 ENCOUNTER — Ambulatory Visit
Admission: RE | Admit: 2015-03-07 | Discharge: 2015-03-07 | Disposition: A | Discharge status: Home / Self Care | Payer: BLUE CROSS/BLUE SHIELD | Source: Ambulatory Visit | Attending: Radiation Oncology | Admitting: Radiation Oncology

## 2015-03-07 DIAGNOSIS — Z510 Encounter for antineoplastic radiation therapy: Secondary | ICD-10-CM | POA: Diagnosis not present

## 2015-03-08 ENCOUNTER — Ambulatory Visit
Admission: RE | Admit: 2015-03-08 | Discharge: 2015-03-08 | Disposition: A | Discharge status: Home / Self Care | Payer: BLUE CROSS/BLUE SHIELD | Source: Ambulatory Visit | Attending: Radiation Oncology | Admitting: Radiation Oncology

## 2015-03-08 ENCOUNTER — Encounter: Payer: Self-pay | Admitting: Hematology

## 2015-03-08 ENCOUNTER — Emergency Department (HOSPITAL_COMMUNITY)
Admission: EM | Admit: 2015-03-08 | Discharge: 2015-03-08 | Disposition: A | Discharge status: Home / Self Care | Payer: BLUE CROSS/BLUE SHIELD | Attending: Emergency Medicine | Admitting: Emergency Medicine

## 2015-03-08 ENCOUNTER — Encounter (HOSPITAL_COMMUNITY): Payer: Self-pay | Admitting: *Deleted

## 2015-03-08 ENCOUNTER — Telehealth: Payer: Self-pay | Admitting: Radiation Oncology

## 2015-03-08 ENCOUNTER — Telehealth: Payer: Self-pay | Admitting: *Deleted

## 2015-03-08 DIAGNOSIS — I10 Essential (primary) hypertension: Secondary | ICD-10-CM | POA: Diagnosis not present

## 2015-03-08 DIAGNOSIS — Z510 Encounter for antineoplastic radiation therapy: Secondary | ICD-10-CM | POA: Diagnosis not present

## 2015-03-08 DIAGNOSIS — Z8669 Personal history of other diseases of the nervous system and sense organs: Secondary | ICD-10-CM | POA: Diagnosis not present

## 2015-03-08 DIAGNOSIS — Z87828 Personal history of other (healed) physical injury and trauma: Secondary | ICD-10-CM | POA: Diagnosis not present

## 2015-03-08 DIAGNOSIS — Z87891 Personal history of nicotine dependence: Secondary | ICD-10-CM | POA: Insufficient documentation

## 2015-03-08 DIAGNOSIS — Z8719 Personal history of other diseases of the digestive system: Secondary | ICD-10-CM | POA: Diagnosis not present

## 2015-03-08 DIAGNOSIS — R1032 Left lower quadrant pain: Secondary | ICD-10-CM | POA: Insufficient documentation

## 2015-03-08 DIAGNOSIS — Z79899 Other long term (current) drug therapy: Secondary | ICD-10-CM | POA: Diagnosis not present

## 2015-03-08 DIAGNOSIS — F329 Major depressive disorder, single episode, unspecified: Secondary | ICD-10-CM | POA: Diagnosis not present

## 2015-03-08 DIAGNOSIS — E559 Vitamin D deficiency, unspecified: Secondary | ICD-10-CM | POA: Diagnosis not present

## 2015-03-08 DIAGNOSIS — Z880 Allergy status to penicillin: Secondary | ICD-10-CM | POA: Diagnosis not present

## 2015-03-08 DIAGNOSIS — Z7952 Long term (current) use of systemic steroids: Secondary | ICD-10-CM | POA: Insufficient documentation

## 2015-03-08 DIAGNOSIS — E780 Pure hypercholesterolemia: Secondary | ICD-10-CM | POA: Insufficient documentation

## 2015-03-08 DIAGNOSIS — M170 Bilateral primary osteoarthritis of knee: Secondary | ICD-10-CM | POA: Insufficient documentation

## 2015-03-08 DIAGNOSIS — Z791 Long term (current) use of non-steroidal anti-inflammatories (NSAID): Secondary | ICD-10-CM | POA: Insufficient documentation

## 2015-03-08 DIAGNOSIS — E669 Obesity, unspecified: Secondary | ICD-10-CM | POA: Diagnosis not present

## 2015-03-08 DIAGNOSIS — C20 Malignant neoplasm of rectum: Secondary | ICD-10-CM

## 2015-03-08 LAB — COMPREHENSIVE METABOLIC PANEL
ALT: 19 U/L (ref 14–54)
ANION GAP: 9 (ref 5–15)
AST: 21 U/L (ref 15–41)
Albumin: 3.6 g/dL (ref 3.5–5.0)
Alkaline Phosphatase: 53 U/L (ref 38–126)
BUN: 11 mg/dL (ref 6–20)
CHLORIDE: 102 mmol/L (ref 101–111)
CO2: 26 mmol/L (ref 22–32)
Calcium: 9 mg/dL (ref 8.9–10.3)
Creatinine, Ser: 0.8 mg/dL (ref 0.44–1.00)
GFR calc non Af Amer: >60 mL/min (ref >60)
Glucose, Bld: 101 mg/dL — ABNORMAL HIGH (ref 65–99)
POTASSIUM: 3.8 mmol/L (ref 3.5–5.1)
SODIUM: 137 mmol/L (ref 135–145)
Total Bilirubin: 0.4 mg/dL (ref 0.3–1.2)
Total Protein: 7 g/dL (ref 6.5–8.1)

## 2015-03-08 LAB — URINALYSIS, ROUTINE W REFLEX MICROSCOPIC
Bilirubin Urine: NEGATIVE
GLUCOSE, UA: NEGATIVE mg/dL
Hgb urine dipstick: NEGATIVE
Ketones, ur: NEGATIVE mg/dL
NITRITE: NEGATIVE
PH: 6 (ref 5.0–8.0)
PROTEIN: NEGATIVE mg/dL
Specific Gravity, Urine: 1.017 (ref 1.005–1.030)
Urobilinogen, UA: 1 mg/dL (ref 0.0–1.0)

## 2015-03-08 LAB — CBC
HCT: 32.3 % — ABNORMAL LOW (ref 36.0–46.0)
HEMOGLOBIN: 9.9 g/dL — AB (ref 12.0–15.0)
MCH: 26.1 pg (ref 26.0–34.0)
MCHC: 30.7 g/dL (ref 30.0–36.0)
MCV: 85 fL (ref 78.0–100.0)
Platelets: 281 10*3/uL (ref 150–400)
RBC: 3.8 MIL/uL — AB (ref 3.87–5.11)
RDW: 16.5 % — ABNORMAL HIGH (ref 11.5–15.5)
WBC: 6.2 10*3/uL (ref 4.0–10.5)

## 2015-03-08 LAB — URINE MICROSCOPIC-ADD ON

## 2015-03-08 LAB — LIPASE, BLOOD: LIPASE: 13 U/L — AB (ref 22–51)

## 2015-03-08 MED ORDER — HYDROMORPHONE HCL 1 MG/ML IJ SOLN
1.0000 mg | Freq: Once | INTRAMUSCULAR | Status: AC
  Administered 2015-03-08: 1 mg via INTRAVENOUS

## 2015-03-08 MED ORDER — ONDANSETRON HCL 4 MG/2ML IJ SOLN
4.0000 mg | Freq: Once | INTRAMUSCULAR | Status: AC
Start: 2015-03-08 — End: 2015-03-08
  Administered 2015-03-08: 4 mg via INTRAVENOUS
  Filled 2015-03-08: qty 2

## 2015-03-08 MED ORDER — HYDROMORPHONE HCL 1 MG/ML IJ SOLN
1.0000 mg | Freq: Once | INTRAMUSCULAR | Status: DC
  Filled 2015-03-08: qty 1

## 2015-03-08 MED ORDER — SODIUM CHLORIDE 0.9 % IV BOLUS (SEPSIS)
1000.0000 mL | Freq: Once | INTRAVENOUS | Status: AC
  Administered 2015-03-08: 1000 mL via INTRAVENOUS

## 2015-03-08 NOTE — ED Notes (Signed)
Pt ambulated to restroom. 

## 2015-03-08 NOTE — Progress Notes (Signed)
Weight and vitals stable. Reports abdominal pain 10 on a scale of 0-10. Describes abdominal pain as gas pressure unrelieved by Gas X. Abdominal distention noted. Reports taking a total of 9 xeloda tablets per day. Denies taking an antiemetic prior to xeloda. Reports several episodes of diarrhea per day. Reports taking Vicodin one tablet every six hours without relief. Reports nausea but, denies emesis. Patient tearful during assessment.  BP 142/87 mmHg  Pulse 71  Temp(Src) 98.8 F (37.1 C) (Oral)  Resp 16  Wt 343 lb 4.8 oz (155.72 kg)  SpO2 100% Wt Readings from Last 3 Encounters:  03/08/15 343 lb 4.8 oz (155.72 kg)  03/06/15 343 lb 4.8 oz (155.72 kg)  03/03/15 346 lb 1.6 oz (156.99 kg)

## 2015-03-08 NOTE — Progress Notes (Signed)
Department of Radiation Oncology  Phone:  [phone] Fax:        [phone]  Weekly Treatment Note    Name: Lindsay Little Date: 03/08/2015 MRN: [id number] DOB: February 16, 1966   Current dose: 23.4 Gy  Current fraction:13   MEDICATIONS: Current Outpatient Prescriptions  Medication Sig Dispense Refill  . belladona alk-PHENObarbital (DONNATAL) 16.2 MG tablet Take 1 tablet by mouth as needed.    . capecitabine (XELODA) 500 MG tablet Take 5 tab in the morning and 4 tab in the evening, after meals, on the day of radiation (off on weekend). 270 tablet 0  . Coenzyme Q10 (COQ-10) 100 MG CAPS Take 100 mg by mouth daily.    . diphenoxylate-atropine (LOMOTIL) 2.5-0.025 MG per tablet Take 1 tablet by mouth 4 (four) times daily as needed for diarrhea or loose stools. 30 tablet 2  . DULoxetine (CYMBALTA) 60 MG capsule Take 60 mg by mouth daily.    Marland Kitchen esomeprazole (NEXIUM) 40 MG capsule Take 40 mg by mouth 2 (two) times daily.    . ferrous sulfate 325 (65 FE) MG tablet Take 325 mg by mouth 2 (two) times daily with a meal.    . furosemide (LASIX) 20 MG tablet Take 20 mg by mouth as needed.    . hydrochlorothiazide (HYDRODIURIL) 25 MG tablet Take 25 mg by mouth daily.    Marland Kitchen HYDROcodone-acetaminophen (NORCO/VICODIN) 5-325 MG per tablet Take 1 tablet by mouth every 6 (six) hours as needed for moderate pain. 60 tablet 0  . hydrocortisone (ANUSOL-HC) 2.5 % rectal cream Place 1 application rectally 2 (two) times daily. 30 g 0  . hyoscyamine (LEVBID) 0.375 MG 12 hr tablet Take 0.375 mg by mouth 2 (two) times daily.    Marland Kitchen loperamide (IMODIUM) 2 MG capsule Take by mouth as needed for diarrhea or loose stools.    . meloxicam (MOBIC) 15 MG tablet Take 15 mg by mouth daily.    . methocarbamol (ROBAXIN) 500 MG tablet Take 500 mg by mouth as needed for muscle spasms.    . metroNIDAZOLE (FLAGYL) 500 MG tablet   0  . nebivolol (BYSTOLIC) 5 MG tablet Take 5 mg by mouth daily.    Marland Kitchen neomycin (MYCIFRADIN) 500 MG  tablet   0  . ondansetron (ZOFRAN) 8 MG tablet Take 1 tablet (8 mg total) by mouth every 8 (eight) hours as needed for nausea. 20 tablet 3  . prochlorperazine (COMPAZINE) 10 MG tablet Take 1 tablet (10 mg total) by mouth every 6 (six) hours as needed. 30 tablet 3  . rosuvastatin (CRESTOR) 5 MG tablet Take 5 mg by mouth daily.    . Vitamin D, Ergocalciferol, (DRISDOL) 50000 UNITS CAPS capsule Take 50,000 Units by mouth 2 (two) times a week.     No current facility-administered medications for this encounter.     ALLERGIES: Caine-1; Penicillins; and Sulfa antibiotics   LABORATORY DATA:  Lab Results  Component Value Date   WBC 5.3 03/06/2015   HGB 9.8* 03/06/2015   HCT 30.5* 03/06/2015   MCV 80.8 03/06/2015   PLT 299 03/06/2015   Lab Results  Component Value Date   NA 141 03/06/2015   K 4.2 03/06/2015   CL 101 09/12/2009   CO2 28 03/06/2015   Lab Results  Component Value Date   ALT 17 03/06/2015   AST 16 03/06/2015   ALKPHOS 58 03/06/2015   BILITOT 0.34 03/06/2015     NARRATIVE: Lindsay Little was seen today for weekly treatment management.  The chart was checked and the patient's films were reviewed.  Weight and vitals stable. Reports abdominal pain 10 on a scale of 0-10. The pain was previously intermittent, but has been constant since Monday night. Describes abdominal pain as gas pressure unrelieved by Gas X. She compares the pain to when her IBS acts up, only much, much worse. Abdominal distention noted. Reports taking a total of 9 xeloda tablets per day. Denies taking an antiemetic prior to xeloda. Reports several episodes of diarrhea per day. Reports taking Vicodin one tablet every six hours without relief. Reports nausea but, denies emesis. Patient tearful during assessment.   11:31 AM There were no vitals taken for this visit.  Wt Readings from Last 3 Encounters:  03/08/15 343 lb 4.8 oz (155.72 kg)  03/06/15 343 lb 4.8 oz (155.72 kg)  03/03/15 346 lb 1.6 oz  (156.99 kg)    PHYSICAL EXAMINATION: vitals were not taken for this visit.     Alert, in no acute distress. Abdominal tenderness noted laterally on the left of the umbilicus. Some general pain and mild tenderness more diffusely.  ASSESSMENT: Patient with severe abdominal pain, not typical for current course of treatment with chemotherapy and radiation. Ideology of current pain unclear.  PLAN: Recommended patient go to the emergency room for evaluation.    This document serves as a record of services personally performed by Kyung Rudd, MD. It was created on his behalf by Arlyce Harman, a trained medical scribe. The creation of this record is based on the scribe's personal observations and the provider's statements to them. This document has been checked and approved by the attending provider. ------------------------------------------------  Jodelle Gross, MD, PhD

## 2015-03-08 NOTE — ED Notes (Signed)
Pt complains of left lower abdominal pain since Monday. Pt is being treated for colo-rectal cancer for the past 3 weeks, pt received chemo and radiation this morning. Pt complains of nausea, pt has taken zofran without relief.

## 2015-03-08 NOTE — Telephone Encounter (Signed)
Received call from Thu, RN for medical oncology. She reports the patient call her in unbearable patient. Thu, RN  requested radiation oncology elevate the patient. Requested Merrilee Seashore, RT send patient around to nursing clinic following treatment for evaluation.

## 2015-03-08 NOTE — Telephone Encounter (Signed)
Reviewed message from [place].  Spoke with Aldona Bar, RN for Dr. Tammi Klippel.  Asked Aldona Bar to assess pt for source of pain when pt comes in for xrt today.  Sam will give update to collaborative nurse.

## 2015-03-08 NOTE — ED Notes (Signed)
All other information charted on EPIC downtime chart.

## 2015-03-09 ENCOUNTER — Ambulatory Visit
Admission: RE | Admit: 2015-03-09 | Discharge: 2015-03-09 | Disposition: A | Discharge status: Home / Self Care | Payer: BLUE CROSS/BLUE SHIELD | Source: Ambulatory Visit | Attending: Radiation Oncology | Admitting: Radiation Oncology

## 2015-03-09 ENCOUNTER — Telehealth: Payer: Self-pay | Admitting: *Deleted

## 2015-03-09 DIAGNOSIS — Z510 Encounter for antineoplastic radiation therapy: Secondary | ICD-10-CM | POA: Diagnosis not present

## 2015-03-09 NOTE — ED Provider Notes (Signed)
CSN: [id number]     Arrival date & time 03/08/15  1158 History   First MD Initiated Contact with Patient 03/08/15 1442     Chief Complaint  Patient presents with  . ca pt   . Abdominal Pain     (Consider location/radiation/quality/duration/timing/severity/associated sxs/prior Treatment) HPI.... Left lower quadrant pain for 2 days. Patient was seen in the [facility] today for chemotherapy and radiation therapy. She has taken her pain medicine at home with minimal relief. No fever, chills, nausea, vomiting, diarrhea, change in bowel or bladder habits. Severity of pain is moderate.  Past Medical History  Diagnosis Date  . Hypertension   . Hypercholesteremia   . Obesity   . Tear of left rotator cuff   . IBS (irritable bowel syndrome)   . Vertigo   . TMJ (dislocation of temporomandibular joint)   . Bulging disc     L5  . Mild sleep apnea   . Depression   . Rectal bleeding   . Vitamin D deficiency   . Osteoarthritis, knee    Past Surgical History  Procedure Laterality Date  . Rotator cuff repair    . Eus N/A 01/18/2015    Procedure: LOWER ENDOSCOPIC ULTRASOUND (EUS);  Surgeon: Arta Silence, MD;  Location: [facility];  Service: Endoscopy;  Laterality: N/A;  . Abdominal hysterectomy  1996    Removed uterus, and tubes   Family History  Problem Relation Age of Onset  . Coronary artery disease Mother 79  . Hypertension Mother   . Hyperlipidemia Mother   . Diabetes Mellitus I Mother   . Coronary artery disease Father   . Hyperlipidemia Father   . Hypertension Father   . Cancer Sister     skin - non melanoma  . Hyperlipidemia Brother   . Cancer Maternal Uncle 59    pancreatic with mets to colon and prostate  . Cirrhosis Maternal Uncle   . Hypertension Maternal Grandmother   . Diabetes Mellitus I Maternal Grandmother   . Hyperlipidemia Maternal Grandmother   . CVA Maternal Grandmother   . Hypertension Maternal Grandfather   . Coronary artery disease Maternal  Grandfather 65  . Hyperlipidemia Maternal Grandfather   . Coronary artery disease Paternal Grandmother   . Hypertension Paternal Grandmother   . Hyperlipidemia Paternal Grandmother   . Diabetes Mellitus I Paternal Grandmother   . Hypertension Paternal Grandfather   . Hyperlipidemia Paternal Grandfather   . Coronary artery disease Paternal Grandfather    Social History  Substance Use Topics  . Smoking status: Former Research scientist (life sciences)  . Smokeless tobacco: None     Comment: 1 pack/week intermittently x 7 years  . Alcohol Use: 0.6 oz/week    1 Shots of liquor per week   OB History    No data available     Review of Systems  All other systems reviewed and are negative.     Allergies  Caine-1; Penicillins; and Sulfa antibiotics  Home Medications   Prior to Admission medications   Medication Sig Start Date End Date Taking? Authorizing Provider  belladona alk-PHENObarbital (DONNATAL) 16.2 MG tablet Take 1 tablet by mouth daily.    Yes Historical Provider, MD  capecitabine (XELODA) 500 MG tablet Take 5 tab in the morning and 4 tab in the evening, after meals, on the day of radiation (off on weekend). 02/09/15  Yes Truitt Merle, MD  Coenzyme Q10 (COQ-10) 100 MG CAPS Take 100 mg by mouth daily.   Yes Historical Provider, MD  diphenoxylate-atropine (LOMOTIL)  2.5-0.025 MG per tablet Take 1 tablet by mouth 4 (four) times daily as needed for diarrhea or loose stools. 02/09/15  Yes Truitt Merle, MD  DULoxetine (CYMBALTA) 60 MG capsule Take 60 mg by mouth daily.   Yes Historical Provider, MD  esomeprazole (NEXIUM) 40 MG capsule Take 40 mg by mouth 2 (two) times daily.   Yes Historical Provider, MD  ferrous sulfate 325 (65 FE) MG tablet Take 325 mg by mouth 2 (two) times daily with a meal.   Yes Truitt Merle, MD  furosemide (LASIX) 20 MG tablet Take 20 mg by mouth as needed for fluid or edema.    Yes Historical Provider, MD  hydrochlorothiazide (HYDRODIURIL) 25 MG tablet Take 25 mg by mouth daily.   Yes Historical  Provider, MD  HYDROcodone-acetaminophen (NORCO/VICODIN) 5-325 MG per tablet Take 1 tablet by mouth every 6 (six) hours as needed for moderate pain. 02/10/15  Yes Truitt Merle, MD  hydrocortisone (ANUSOL-HC) 2.5 % rectal cream Place 1 application rectally 2 (two) times daily. Patient taking differently: Place 1 application rectally 2 (two) times daily as needed for itching.  02/10/15  Yes Truitt Merle, MD  hyoscyamine (LEVBID) 0.375 MG 12 hr tablet Take 0.375 mg by mouth 2 (two) times daily.   Yes Historical Provider, MD  loperamide (IMODIUM) 2 MG capsule Take 2 mg by mouth daily as needed for diarrhea or loose stools.    Yes Historical Provider, MD  meloxicam (MOBIC) 15 MG tablet Take 15 mg by mouth daily.   Yes Historical Provider, MD  methocarbamol (ROBAXIN) 500 MG tablet Take 500 mg by mouth as needed for muscle spasms.   Yes Historical Provider, MD  nebivolol (BYSTOLIC) 5 MG tablet Take 5 mg by mouth daily.   Yes Historical Provider, MD  ondansetron (ZOFRAN) 8 MG tablet Take 1 tablet (8 mg total) by mouth every 8 (eight) hours as needed for nausea. 02/14/15  Yes Truitt Merle, MD  rosuvastatin (CRESTOR) 5 MG tablet Take 5 mg by mouth daily.   Yes Historical Provider, MD  Vitamin D, Ergocalciferol, (DRISDOL) 50000 UNITS CAPS capsule Take 50,000 Units by mouth 2 (two) times a week.   Yes Historical Provider, MD  prochlorperazine (COMPAZINE) 10 MG tablet Take 1 tablet (10 mg total) by mouth every 6 (six) hours as needed. Patient not taking: Reported on 03/08/2015 03/06/15   Truitt Merle, MD   BP 120/48 mmHg  Pulse 68  Temp(Src) 98.4 F (36.9 C) (Oral)  Resp 21  SpO2 92% Physical Exam  Constitutional: She is oriented to person, place, and time.  obese  HENT:  Head: Normocephalic and atraumatic.  Eyes: Conjunctivae and EOM are normal. Pupils are equal, round, and reactive to light.  Neck: Normal range of motion. Neck supple.  Cardiovascular: Normal rate and regular rhythm.   Pulmonary/Chest: Effort normal and  breath sounds normal.  Abdominal: Soft. Bowel sounds are normal.  Minimal left lower quadrant tenderness  Musculoskeletal: Normal range of motion.  Neurological: She is alert and oriented to person, place, and time.  Skin: Skin is warm and dry.  Psychiatric: She has a normal mood and affect. Her behavior is normal.  Nursing note and vitals reviewed.   ED Course  Procedures (including critical care time) Labs Review Labs Reviewed  LIPASE, BLOOD - Abnormal; Notable for the following:    Lipase 13 (*)    All other components within normal limits  COMPREHENSIVE METABOLIC PANEL - Abnormal; Notable for the following:    Glucose, Bld  101 (*)    All other components within normal limits  CBC - Abnormal; Notable for the following:    RBC 3.80 (*)    Hemoglobin 9.9 (*)    HCT 32.3 (*)    RDW 16.5 (*)    All other components within normal limits  URINALYSIS, ROUTINE W REFLEX MICROSCOPIC (NOT AT [facility]) - Abnormal; Notable for the following:    APPearance CLOUDY (*)    Leukocytes, UA SMALL (*)    All other components within normal limits  URINE MICROSCOPIC-ADD ON - Abnormal; Notable for the following:    Squamous Epithelial / LPF FEW (*)    All other components within normal limits    Imaging Review No results found. I have personally reviewed and evaluated these images and lab results as part of my medical decision-making.   EKG Interpretation None      MDM   Final diagnoses:  LLQ abdominal pain    No acute abdomen. White count is normal. Urinalysis shows no evidence of infection. She feels better after pain medication. Instructed to take 2 Vicodin tablets at home for pain. She has primary care and oncology follow-up.  Nat Christen, MD 03/09/15 [phone]

## 2015-03-09 NOTE — Telephone Encounter (Signed)
Called patient home left voice message, to check on her status , labs ok,  then called cell phone, patient doing much better stated"In the ED they gave me 2 liters IVF'S and Dilaudid, ", she is on her way now for her radiation treatment Still in pain  but much much better",thanked me for calling and checking on her 9:54 AM

## 2015-03-10 ENCOUNTER — Ambulatory Visit
Admission: RE | Admit: 2015-03-10 | Discharge: 2015-03-10 | Disposition: A | Discharge status: Home / Self Care | Payer: BLUE CROSS/BLUE SHIELD | Source: Ambulatory Visit | Attending: Radiation Oncology | Admitting: Radiation Oncology

## 2015-03-10 ENCOUNTER — Encounter: Payer: Self-pay | Admitting: Radiation Oncology

## 2015-03-10 VITALS — BP 149/69 | HR 61 | Temp 99.1°F | Resp 20 | Wt 347.2 lb

## 2015-03-10 DIAGNOSIS — Z510 Encounter for antineoplastic radiation therapy: Secondary | ICD-10-CM | POA: Diagnosis not present

## 2015-03-10 DIAGNOSIS — C20 Malignant neoplasm of rectum: Secondary | ICD-10-CM

## 2015-03-10 NOTE — Progress Notes (Signed)
The patient is feeling much better. We will continue with her course of radiation treatment.

## 2015-03-10 NOTE — Progress Notes (Signed)
Weekly rad txs rectal, had Fluids Wednesday 2 liters IVS's,  And is doing a lot better stated, had a normal bowel movemnet today, pain 3/10 scale, bottom sore but no skin broken , drinking plenty water appetite, fair,  Mild fatigue, mild nausea, but better after the IVF's on WED. BP 149/69 mmHg  Pulse 61  Temp(Src) 99.1 F (37.3 C) (Oral)  Resp 20  Wt 347 lb 3.2 oz (157.489 kg)  . Wt Readings from Last 3 Encounters:  03/10/15 347 lb 3.2 oz (157.489 kg)  03/08/15 343 lb 4.8 oz (155.72 kg)  03/06/15 343 lb 4.8 oz (155.72 kg)

## 2015-03-13 ENCOUNTER — Ambulatory Visit (HOSPITAL_BASED_OUTPATIENT_CLINIC_OR_DEPARTMENT_OTHER): Payer: BLUE CROSS/BLUE SHIELD | Admitting: Nurse Practitioner

## 2015-03-13 ENCOUNTER — Other Ambulatory Visit: Payer: Self-pay | Admitting: *Deleted

## 2015-03-13 ENCOUNTER — Other Ambulatory Visit (HOSPITAL_BASED_OUTPATIENT_CLINIC_OR_DEPARTMENT_OTHER): Payer: BLUE CROSS/BLUE SHIELD

## 2015-03-13 ENCOUNTER — Ambulatory Visit
Admission: RE | Admit: 2015-03-13 | Discharge: 2015-03-13 | Disposition: A | Discharge status: Home / Self Care | Payer: BLUE CROSS/BLUE SHIELD | Source: Ambulatory Visit | Attending: Radiation Oncology | Admitting: Radiation Oncology

## 2015-03-13 VITALS — BP 150/72 | HR 67 | Temp 98.6°F | Resp 18 | Ht 71.0 in | Wt 344.7 lb

## 2015-03-13 DIAGNOSIS — D500 Iron deficiency anemia secondary to blood loss (chronic): Secondary | ICD-10-CM

## 2015-03-13 DIAGNOSIS — C20 Malignant neoplasm of rectum: Secondary | ICD-10-CM

## 2015-03-13 DIAGNOSIS — Z510 Encounter for antineoplastic radiation therapy: Secondary | ICD-10-CM | POA: Diagnosis not present

## 2015-03-13 DIAGNOSIS — K922 Gastrointestinal hemorrhage, unspecified: Secondary | ICD-10-CM

## 2015-03-13 DIAGNOSIS — R197 Diarrhea, unspecified: Secondary | ICD-10-CM | POA: Diagnosis not present

## 2015-03-13 LAB — CBC WITH DIFFERENTIAL/PLATELET
BASO%: 0.5 % (ref 0.0–2.0)
BASOS ABS: 0 10*3/uL (ref 0.0–0.1)
EOS ABS: 0.3 10*3/uL (ref 0.0–0.5)
EOS%: 4.4 % (ref 0.0–7.0)
HCT: 30.3 % — ABNORMAL LOW (ref 34.8–46.6)
HEMOGLOBIN: 9.7 g/dL — AB (ref 11.6–15.9)
LYMPH%: 11 % — AB (ref 14.0–49.7)
MCH: 26.3 pg (ref 25.1–34.0)
MCHC: 31.9 g/dL (ref 31.5–36.0)
MCV: 82.6 fL (ref 79.5–101.0)
MONO#: 0.5 10*3/uL (ref 0.1–0.9)
MONO%: 7.9 % (ref 0.0–14.0)
NEUT#: 4.4 10*3/uL (ref 1.5–6.5)
NEUT%: 76.2 % (ref 38.4–76.8)
PLATELETS: 249 10*3/uL (ref 145–400)
RBC: 3.67 10*6/uL — AB (ref 3.70–5.45)
RDW: 16.1 % — ABNORMAL HIGH (ref 11.2–14.5)
WBC: 5.8 10*3/uL (ref 3.9–10.3)
lymph#: 0.6 10*3/uL — ABNORMAL LOW (ref 0.9–3.3)

## 2015-03-13 LAB — COMPREHENSIVE METABOLIC PANEL (CC13)
ALBUMIN: 3.4 g/dL — AB (ref 3.5–5.0)
ALK PHOS: 53 U/L (ref 40–150)
ALT: 17 U/L (ref 0–55)
ANION GAP: 6 meq/L (ref 3–11)
AST: 13 U/L (ref 5–34)
BUN: 9.6 mg/dL (ref 7.0–26.0)
CO2: 27 mEq/L (ref 22–29)
Calcium: 9.3 mg/dL (ref 8.4–10.4)
Chloride: 108 mEq/L (ref 98–109)
Creatinine: 0.7 mg/dL (ref 0.6–1.1)
GLUCOSE: 83 mg/dL (ref 70–140)
POTASSIUM: 4.1 meq/L (ref 3.5–5.1)
SODIUM: 141 meq/L (ref 136–145)
Total Bilirubin: 0.32 mg/dL (ref 0.20–1.20)
Total Protein: 6.4 g/dL (ref 6.4–8.3)

## 2015-03-13 MED ORDER — CAPECITABINE 500 MG PO TABS: ORAL_TABLET | ORAL | Status: DC

## 2015-03-13 NOTE — Progress Notes (Signed)
[facility] OFFICE PROGRESS NOTE   Diagnosis:  rectal cancer   Oncology History   Rectal adenocarcinoma  Staging form: Colon and Rectum, AJCC 7th Edition  Clinical: T3, N2, M0 - Unsigned       Rectal adenocarcinoma   01/13/2015 Initial Diagnosis Rectal adenocarcinoma   01/13/2015 Procedure Colonoscopy showed a nonobstructing mass in the rectum and from 12-18 cm proximal to the anus. The mass was circumferential, measuring about 6 cm in length. EGD was negative.   01/17/2015 Tumor Marker CEA 1.4, CA-19-9 8    01/18/2015 Procedure Lower endoscopic ultrasound by Dr.  Paulita Little showed a T3 N2 rectal mass.   01/20/2015 Imaging CT abdomen and pelvis with contrast showed right lateral rectal wall exophytic mass, and 2 ill-defined hepatic hypoenhancing lesions which appears to correspond to the lesion seen on the prior MRI in 2015.   02/05/2015 Imaging abdomen MRI showed 2 hemangioma, no suspicion for metastatic disease. CT chest was negative   02/20/2015 -  Radiation Therapy neoadjuvant RT to rectal cancer    02/20/2015 Concurrent Chemotherapy capecitabine 2500 mg in the morning and 2000 mg in the evening (842m/m2, bid), on the day of radiation              INTERVAL HISTORY:   Ms. HPhillisreturns as scheduled. She continues radiation and Xeloda. She was seen in the emergency Department on 03/08/2015 for evaluation of abdominal pain. Her pain improved with IV fluids and pain medication in the emergency room. She was discharged home. The abdominal pain continues to be improved. She has persistent pain at the rectum. She takes hydrocodone as needed. She is taking an anti-emetic before Xeloda and notes less nausea. She has intermittent "gas pain and bloating". She has small frequent bowel movements. She takes Lomotil and Imodium. She notes less rectal bleeding. No significant mouth sores. No hand or foot pain or  redness.   Objective:  Vital signs in last 24 hours:  Blood pressure 150/72, pulse 67, temperature 98.6 F (37 C), temperature source Oral, resp. rate 18, height 5' 11"  (1.803 m), weight 344 lb 11.2 oz (156.355 kg), SpO2 97 %.    HEENT: No thrush or ulcers. Resp: Lungs clear bilaterally. Cardio: Regular rate and rhythm. GI: Abdomen is soft and nontender. No hepatomegaly. Vascular: No leg edema. Skin: Palms without erythema. Perianal skin is hyperpigmented. No skin breakdown.    Lab Results:  Lab Results  Component Value Date   WBC 5.8 03/13/2015   HGB 9.7* 03/13/2015   HCT 30.3* 03/13/2015   MCV 82.6 03/13/2015   PLT 249 03/13/2015   NEUTROABS 4.4 03/13/2015    Imaging:  No results found.  Medications: I have reviewed the patient's current medications.  Assessment/Plan: 1. T3N2M0, Stage IIIB/C rectal adenocarcinoma -We reviewed with the patient her biopsy and imaging results along with records sent to [facility] doctor. We explained to her the significance of her tumor markers, CEA and CA 19-9, and their low levels as favorable.  -CT chest and abdomen MRI scan results, which showed no evidence of distant metastasis. Liver lesions are consistent with hemangioma.  -she has stage IIIc disease. Given the locally advanced tumor, we recommended undergoing neoadjuvant therapy with either 5-FU or capecitabine followed by surgical management and adjuvant chemotherapy.  -She began concurrent chemoradiation with capecitabine on 02/20/2015 2. Microcytic anemia, secondary to GI blood loss and iron deficiency anemia - Likely in the setting of ongoing blood loss from her malignancy. Recommended she  take over-the-counter ferrous sulfate 1-2 tablets a day.  -We discussed if she is not able to tolerate oral iron, we'll consider IV Feraheme. -Hemoglobin lower 02/27/2015. Recommended that she begin oral iron.   3. Diarrhea: related to her cancer. She will continue Imodium and  Lomotil   Disposition: Lindsay Little continues radiation and Xeloda. We will see her in follow-up in one week. She will contact the office in the interim with any problems.    Lindsay Little ANP/GNP-BC   03/13/2015  11:06 AM

## 2015-03-14 ENCOUNTER — Ambulatory Visit
Admission: RE | Admit: 2015-03-14 | Discharge: 2015-03-14 | Disposition: A | Discharge status: Home / Self Care | Payer: BLUE CROSS/BLUE SHIELD | Source: Ambulatory Visit | Attending: Radiation Oncology | Admitting: Radiation Oncology

## 2015-03-14 ENCOUNTER — Encounter: Payer: Self-pay | Admitting: Cardiology

## 2015-03-14 ENCOUNTER — Encounter: Payer: Self-pay | Admitting: *Deleted

## 2015-03-14 ENCOUNTER — Ambulatory Visit (INDEPENDENT_AMBULATORY_CARE_PROVIDER_SITE_OTHER): Payer: BLUE CROSS/BLUE SHIELD | Admitting: Cardiology

## 2015-03-14 VITALS — BP 128/80 | HR 64 | Ht 71.0 in | Wt 344.0 lb

## 2015-03-14 DIAGNOSIS — Z01810 Encounter for preprocedural cardiovascular examination: Secondary | ICD-10-CM | POA: Diagnosis not present

## 2015-03-14 DIAGNOSIS — E7800 Pure hypercholesterolemia, unspecified: Secondary | ICD-10-CM

## 2015-03-14 DIAGNOSIS — Z510 Encounter for antineoplastic radiation therapy: Secondary | ICD-10-CM | POA: Diagnosis not present

## 2015-03-14 DIAGNOSIS — I10 Essential (primary) hypertension: Secondary | ICD-10-CM

## 2015-03-14 NOTE — Progress Notes (Signed)
[facility]. [address], [address] [place], [place]  [address] Phone: [phone] Fax:  [phone]  Date:  03/14/2015   ID:  Lindsay Little, DOB 01/15/1966, MRN [id number]  PCP:  Vidal Schwalbe, MD   History of Present Illness: Lindsay Little is a 49 y.o. 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 Leonia Reeves. in regards to heart disease, her father died at age 60 because of coronary artery disease likely secondary to familial hyperlipidemia.    Her mother also had a 95% lesion that was stented by Dr. Irish Lack. She has 2 children, husband, and she is concerned about her overall cardiovascular health.  Studies:   ETT 03/17/13 - 5 minutes, sinus tachycardia at rest. No ST segment changes. No chest pain, rare PVC. Blood pressure 156/96 at rest,  Stress test. Impression: Overall low risk with no electrocardiographic evidence of ischemia however poor exercise tolerance noted as well as hypertension.   Records show a former echocardiogram in 2003 showing normal function. Lots of stress at home and at work, perimenopausal. 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 She works side-by-side with Dr. Maxwell Caul.  Unfortunately, she was diagnosed with rectal cancer, stage III after colonoscopy by Dr. Amedeo Plenty.   Wt Readings from Last 3 Encounters:  03/14/15 344 lb (156.037 kg)  03/13/15 344 lb 11.2 oz (156.355 kg)  03/10/15 347 lb 3.2 oz (157.489 kg)     Past Medical History  Diagnosis Date  . Hypertension   . Hypercholesteremia   . Obesity   . Tear of left rotator cuff   . IBS (irritable bowel syndrome)   . Vertigo   . TMJ (dislocation of temporomandibular joint)   . Bulging disc     L5  . Mild sleep apnea   . Depression   . Rectal bleeding   . Vitamin D  deficiency   . Osteoarthritis, knee     Past Surgical History  Procedure Laterality Date  . Rotator cuff repair    . Eus N/A 01/18/2015    Procedure: LOWER ENDOSCOPIC ULTRASOUND (EUS);  Surgeon: Arta Silence, MD;  Location: [facility];  Service: Endoscopy;  Laterality: N/A;  . Abdominal hysterectomy  1996    Removed uterus, and tubes    Current Outpatient Prescriptions  Medication Sig Dispense Refill  . belladona alk-PHENObarbital (DONNATAL) 16.2 MG tablet Take 1 tablet by mouth daily.     . capecitabine (XELODA) 500 MG tablet Take 5 tab in the morning and 4 tab in the evening, after meals, on the day of radiation (off on weekend). 72 tablet 0  . Coenzyme Q10 (COQ-10) 100 MG CAPS Take 100 mg by mouth daily.    . diphenoxylate-atropine (LOMOTIL) 2.5-0.025 MG per tablet Take 1 tablet by mouth 4 (four) times daily as needed for diarrhea or loose stools. 30 tablet 2  . DULoxetine (CYMBALTA) 60 MG capsule Take 60 mg by mouth daily.    Marland Kitchen esomeprazole (NEXIUM) 40 MG capsule Take 40 mg by mouth 2 (two) times daily.    . ferrous sulfate 325 (65 FE) MG tablet Take 325 mg by mouth 2 (two) times daily with a meal.    . HYDROcodone-acetaminophen (NORCO/VICODIN) 5-325 MG per tablet Take 2 tablets by mouth every 4 (four) hours as  needed for moderate pain (every 4 to 6 hours).    . hydrocortisone 2.5 % cream Apply 1 application topically as needed.    . hyoscyamine (LEVBID) 0.375 MG 12 hr tablet Take 0.375 mg by mouth 2 (two) times daily.    Marland Kitchen loperamide (IMODIUM) 2 MG capsule Take 2 mg by mouth daily as needed for diarrhea or loose stools.     . meloxicam (MOBIC) 15 MG tablet Take 15 mg by mouth daily.    . methocarbamol (ROBAXIN) 500 MG tablet Take 500 mg by mouth as needed for muscle spasms.    . nebivolol (BYSTOLIC) 5 MG tablet Take 5 mg by mouth daily.    . ondansetron (ZOFRAN) 8 MG tablet Take 1 tablet (8 mg total) by mouth every 8 (eight) hours as needed for nausea. 20 tablet 3  .  prochlorperazine (COMPAZINE) 10 MG tablet Take 1 tablet (10 mg total) by mouth every 6 (six) hours as needed. 30 tablet 3  . rosuvastatin (CRESTOR) 5 MG tablet Take 5 mg by mouth daily.    . Vitamin D, Ergocalciferol, (DRISDOL) 50000 UNITS CAPS capsule Take 50,000 Units by mouth 2 (two) times a week.    . furosemide (LASIX) 20 MG tablet Take 20 mg by mouth as needed for fluid or edema.     . hydrochlorothiazide (HYDRODIURIL) 25 MG tablet Take 25 mg by mouth daily.     No current facility-administered medications for this visit.    Allergies:    Allergies  Allergen Reactions  . Caine-1 [Lidocaine] Rash  . Penicillins Rash  . Sulfa Antibiotics Rash    Rash & Vomiting    Social History:  The patient  reports that she has quit smoking. She does not have any smokeless tobacco history on file. She reports that she drinks about 0.6 oz of alcohol per week.   Family History  Problem Relation Age of Onset  . Coronary artery disease Mother 38  . Hypertension Mother   . Hyperlipidemia Mother   . Diabetes Mellitus I Mother   . Coronary artery disease Father   . Hyperlipidemia Father   . Hypertension Father   . Cancer Sister     skin - non melanoma  . Hyperlipidemia Brother   . Cancer Maternal Uncle 52    pancreatic with mets to colon and prostate  . Cirrhosis Maternal Uncle   . Hypertension Maternal Grandmother   . Diabetes Mellitus I Maternal Grandmother   . Hyperlipidemia Maternal Grandmother   . CVA Maternal Grandmother   . Hypertension Maternal Grandfather   . Coronary artery disease Maternal Grandfather 65  . Hyperlipidemia Maternal Grandfather   . Coronary artery disease Paternal Grandmother   . Hypertension Paternal Grandmother   . Hyperlipidemia Paternal Grandmother   . Diabetes Mellitus I Paternal Grandmother   . Hypertension Paternal Grandfather   . Hyperlipidemia Paternal Grandfather   . Coronary artery disease Paternal Grandfather     ROS:  Please see the history of  present illness.   Denies any syncope, bleeding, orthopnea, PND   Left knee.33'. All other systems reviewed and negative.   PHYSICAL EXAM: VS:  BP 128/80 mmHg  Pulse 64  Ht _0  (1.803 m)  Wt 344 lb (156.037 kg)  BMI 48.00 kg/m2 Well nourished, well developed, in no acute distress HEENT: normal, [place]/AT, EOMI Neck: no JVD, normal carotid upstroke, no bruit Cardiac:  normal S1, S2; RRR; no murmur Lungs:  clear to auscultation bilaterally, no wheezing, rhonchi or rales  Abd: soft, nontender, no hepatomegaly, no bruits Ext: no edema, 2+ distal pulses Skin: warm and dry GU: deferred Neuro: no focal abnormalities noted, AAO x 3  EKG:  03/14/15-today-sinus rhythm, no other abnormalities. 02/21/14  -sinus rhythm, 68, no other significant abnormalities. No change prior   Labs: 09/16/13-LDL 146  ASSESSMENT AND PLAN:  1. Preoperative risk stratification-abdominal surgery/general anesthesia-she is able to complete greater than 4 METS of activity without difficulty. No anginal symptoms. EKG is normal. She is of low overall cardiac risk. Discussed with she and her husband. She may proceed without further cardiac testing. 2. Family history of early coronary artery disease-excellent use of Crestor, now taking on a daily basis.  3. Morbid obesity- Her husband is going to undergo bariatric surgery. Discussed decreasing carbohydrates. Weight loss. 4. Hyperlipidemia-as described above. LDL 146. Crestor. 5. Rectal adenocarcinoma- stage 3. Dr. Amedeo Plenty. Radiation and chemo. Will be undergoing abdominal surgery by Dr. Johney Maine in November likely. 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  6. One year followup.  Signed, Candee Furbish, MD [facility]  03/14/2015 4:15 PM

## 2015-03-14 NOTE — Patient Instructions (Signed)
Medication Instructions:  Please stop your HCTZ. Continue all other medications as listed.  Follow-Up: Follow up in 1 year with Dr. Marlou Porch.  You will receive a letter in the mail 2 months before you are due.  Please call us when you receive this letter to schedule your follow up appointment.  Thank you for choosing [facility]!!

## 2015-03-15 ENCOUNTER — Ambulatory Visit
Admission: RE | Admit: 2015-03-15 | Discharge: 2015-03-15 | Disposition: A | Discharge status: Home / Self Care | Payer: BLUE CROSS/BLUE SHIELD | Source: Ambulatory Visit | Attending: Radiation Oncology | Admitting: Radiation Oncology

## 2015-03-15 DIAGNOSIS — Z510 Encounter for antineoplastic radiation therapy: Secondary | ICD-10-CM | POA: Diagnosis not present

## 2015-03-16 ENCOUNTER — Ambulatory Visit
Admission: RE | Admit: 2015-03-16 | Discharge: 2015-03-16 | Disposition: A | Discharge status: Home / Self Care | Payer: BLUE CROSS/BLUE SHIELD | Source: Ambulatory Visit | Attending: Radiation Oncology | Admitting: Radiation Oncology

## 2015-03-16 DIAGNOSIS — Z510 Encounter for antineoplastic radiation therapy: Secondary | ICD-10-CM | POA: Diagnosis not present

## 2015-03-17 ENCOUNTER — Ambulatory Visit
Admission: RE | Admit: 2015-03-17 | Discharge: 2015-03-17 | Disposition: A | Discharge status: Home / Self Care | Payer: BLUE CROSS/BLUE SHIELD | Source: Ambulatory Visit | Attending: Radiation Oncology | Admitting: Radiation Oncology

## 2015-03-17 ENCOUNTER — Encounter: Payer: Self-pay | Admitting: Radiation Oncology

## 2015-03-17 VITALS — BP 131/74 | HR 99 | Temp 98.2°F | Resp 16 | Ht 71.0 in | Wt 336.7 lb

## 2015-03-17 DIAGNOSIS — C20 Malignant neoplasm of rectum: Secondary | ICD-10-CM

## 2015-03-17 DIAGNOSIS — Z510 Encounter for antineoplastic radiation therapy: Secondary | ICD-10-CM | POA: Diagnosis not present

## 2015-03-17 NOTE — Progress Notes (Signed)
Department of Radiation Oncology  Phone:  [phone] Fax:        [phone]  Weekly Treatment Note    Name: Lindsay Little Date: 03/20/2015 MRN: [id number] DOB: 1965/11/05   Current dose: 36 Gy  Current fraction: 20   MEDICATIONS: Current Outpatient Prescriptions  Medication Sig Dispense Refill  . belladona alk-PHENObarbital (DONNATAL) 16.2 MG tablet Take 1 tablet by mouth daily.     . capecitabine (XELODA) 500 MG tablet Take 5 tab in the morning and 4 tab in the evening, after meals, on the day of radiation (off on weekend). 72 tablet 0  . Coenzyme Q10 (COQ-10) 100 MG CAPS Take 100 mg by mouth daily.    . diphenoxylate-atropine (LOMOTIL) 2.5-0.025 MG per tablet Take 1 tablet by mouth 4 (four) times daily as needed for diarrhea or loose stools. 30 tablet 2  . DULoxetine (CYMBALTA) 60 MG capsule Take 60 mg by mouth daily.    Marland Kitchen esomeprazole (NEXIUM) 40 MG capsule Take 40 mg by mouth 2 (two) times daily.    Marland Kitchen HYDROcodone-acetaminophen (NORCO/VICODIN) 5-325 MG per tablet Take 2 tablets by mouth every 4 (four) hours as needed for moderate pain (every 4 to 6 hours).    . hydrocortisone 2.5 % cream Apply 1 application topically as needed.    . hyoscyamine (LEVBID) 0.375 MG 12 hr tablet Take 0.375 mg by mouth 2 (two) times daily.    Marland Kitchen loperamide (IMODIUM) 2 MG capsule Take 2 mg by mouth daily as needed for diarrhea or loose stools.     . meloxicam (MOBIC) 15 MG tablet Take 15 mg by mouth daily.    . methocarbamol (ROBAXIN) 500 MG tablet Take 500 mg by mouth as needed for muscle spasms.    . nebivolol (BYSTOLIC) 5 MG tablet Take 5 mg by mouth daily.    . prochlorperazine (COMPAZINE) 10 MG tablet Take 1 tablet (10 mg total) by mouth every 6 (six) hours as needed. 30 tablet 3  . rosuvastatin (CRESTOR) 5 MG tablet Take 5 mg by mouth daily.    . Vitamin D, Ergocalciferol, (DRISDOL) 50000 UNITS CAPS capsule Take 50,000 Units by mouth 2 (two) times a week.    . ferrous sulfate 325 (65  FE) MG tablet Take 325 mg by mouth 2 (two) times daily with a meal.    . furosemide (LASIX) 20 MG tablet Take 20 mg by mouth as needed for fluid or edema.     . ondansetron (ZOFRAN) 8 MG tablet Take 1 tablet (8 mg total) by mouth every 8 (eight) hours as needed for nausea. (Patient not taking: Reported on 03/17/2015) 20 tablet 3   No current facility-administered medications for this encounter.     ALLERGIES: Caine-1; Penicillins; and Sulfa antibiotics   LABORATORY DATA:  Lab Results  Component Value Date   WBC 5.8 03/13/2015   HGB 9.7* 03/13/2015   HCT 30.3* 03/13/2015   MCV 82.6 03/13/2015   PLT 249 03/13/2015   Lab Results  Component Value Date   NA 141 03/13/2015   K 4.1 03/13/2015   CL 102 03/08/2015   CO2 27 03/13/2015   Lab Results  Component Value Date   ALT 17 03/13/2015   AST 13 03/13/2015   ALKPHOS 53 03/13/2015   BILITOT 0.32 03/13/2015     NARRATIVE: Lindsay Little was seen today for weekly treatment management. The chart was checked and the patient's films were reviewed.  Lindsay Little has completed 20 fractions to her rectum.  She reports pain in her rectal area as a 3/10. She reports that her stools are more formed and she is having 9-10 a day. She last had to take lomotil on Wednesday and took Imodium yesterday. She is taking Xeloda. She reports her nausea is better controlled on Compazine. She has not had to take Norco since Wednesday. She denies skin irritation in her rectal area. She also reports fatigue.  11:34 AM BP 131/74 mmHg  Pulse 99  Temp(Src) 98.2 F (36.8 C) (Oral)  Resp 16  Ht 5' 11"  (1.803 m)  Wt 336 lb 11.2 oz (152.726 kg)  BMI 46.98 kg/m2  Wt Readings from Last 3 Encounters:  03/17/15 336 lb 11.2 oz (152.726 kg)  03/14/15 344 lb (156.037 kg)  03/13/15 344 lb 11.2 oz (156.355 kg)    PHYSICAL EXAMINATION: height is 5' 11"  (1.803 m) and weight is 336 lb 11.2 oz (152.726 kg). Her oral temperature is 98.2 F (36.8 C). Her blood  pressure is 131/74 and her pulse is 99. Her respiration is 16.  Alert, in no acute distress.  ASSESSMENT: The patient is doing satisfactorily with treatment.  PLAN: We will continue with the patient's radiation treatment as planned.   This document serves as a record of services personally performed by Kyung Rudd, MD. It was created on his behalf by Darcus Austin, a trained medical scribe. The creation of this record is based on the scribe's personal observations and the provider's statements to them. This document has been checked and approved by the attending provider.     ------------------------------------------------  Jodelle Gross, MD, PhD

## 2015-03-17 NOTE — Progress Notes (Signed)
Lindsay Little has completed 20 fractions to her rectum.  She reports pain in her rectal area at a 3/10.  She reports that her stools are more formed and she is having 9-10 a day.  She last had to take lomotil on Wednesday and toke Imodium yesterday.  She is taking Xeloda.  She reports her nausea is better controled on compazine.  She has not had to take norco since Wednesday.  She denies skin irritation in her rectal area.  She reports fatigue.  BP 131/74 mmHg  Pulse 99  Temp(Src) 98.2 F (36.8 C) (Oral)  Resp 16  Ht 5' 11"  (1.803 m)  Wt 336 lb 11.2 oz (152.726 kg)  BMI 46.98 kg/m2   Wt Readings from Last 3 Encounters:  03/17/15 336 lb 11.2 oz (152.726 kg)  03/14/15 344 lb (156.037 kg)  03/13/15 344 lb 11.2 oz (156.355 kg)

## 2015-03-21 ENCOUNTER — Telehealth: Payer: Self-pay | Admitting: Hematology

## 2015-03-21 ENCOUNTER — Other Ambulatory Visit: Payer: Self-pay | Admitting: Surgery

## 2015-03-21 ENCOUNTER — Ambulatory Visit (HOSPITAL_BASED_OUTPATIENT_CLINIC_OR_DEPARTMENT_OTHER): Payer: BLUE CROSS/BLUE SHIELD | Admitting: Hematology

## 2015-03-21 ENCOUNTER — Ambulatory Visit
Admission: RE | Admit: 2015-03-21 | Discharge: 2015-03-21 | Disposition: A | Discharge status: Home / Self Care | Payer: BLUE CROSS/BLUE SHIELD | Source: Ambulatory Visit | Attending: Radiation Oncology | Admitting: Radiation Oncology

## 2015-03-21 ENCOUNTER — Encounter: Payer: Self-pay | Admitting: Hematology

## 2015-03-21 ENCOUNTER — Other Ambulatory Visit (HOSPITAL_BASED_OUTPATIENT_CLINIC_OR_DEPARTMENT_OTHER): Payer: BLUE CROSS/BLUE SHIELD

## 2015-03-21 VITALS — BP 124/66 | HR 66 | Temp 98.7°F | Resp 18 | Ht 71.0 in | Wt 337.4 lb

## 2015-03-21 DIAGNOSIS — G893 Neoplasm related pain (acute) (chronic): Secondary | ICD-10-CM | POA: Diagnosis not present

## 2015-03-21 DIAGNOSIS — C20 Malignant neoplasm of rectum: Secondary | ICD-10-CM

## 2015-03-21 DIAGNOSIS — Z510 Encounter for antineoplastic radiation therapy: Secondary | ICD-10-CM | POA: Diagnosis not present

## 2015-03-21 DIAGNOSIS — R197 Diarrhea, unspecified: Secondary | ICD-10-CM

## 2015-03-21 DIAGNOSIS — D500 Iron deficiency anemia secondary to blood loss (chronic): Secondary | ICD-10-CM | POA: Diagnosis not present

## 2015-03-21 DIAGNOSIS — K922 Gastrointestinal hemorrhage, unspecified: Secondary | ICD-10-CM | POA: Diagnosis not present

## 2015-03-21 LAB — COMPREHENSIVE METABOLIC PANEL (CC13)
ALK PHOS: 62 U/L (ref 40–150)
ALT: 16 U/L (ref 0–55)
AST: 11 U/L (ref 5–34)
Albumin: 3.7 g/dL (ref 3.5–5.0)
Anion Gap: 6 mEq/L (ref 3–11)
BUN: 11.7 mg/dL (ref 7.0–26.0)
CALCIUM: 9.9 mg/dL (ref 8.4–10.4)
CHLORIDE: 107 meq/L (ref 98–109)
CO2: 28 mEq/L (ref 22–29)
CREATININE: 0.7 mg/dL (ref 0.6–1.1)
EGFR: >90 mL/min/{1.73_m2} (ref >90)
GLUCOSE: 101 mg/dL (ref 70–140)
Potassium: 4.3 mEq/L (ref 3.5–5.1)
SODIUM: 141 meq/L (ref 136–145)
Total Bilirubin: 0.41 mg/dL (ref 0.20–1.20)
Total Protein: 6.8 g/dL (ref 6.4–8.3)

## 2015-03-21 LAB — CBC WITH DIFFERENTIAL/PLATELET
BASO%: 0.5 % (ref 0.0–2.0)
Basophils Absolute: 0 10*3/uL (ref 0.0–0.1)
EOS%: 4.4 % (ref 0.0–7.0)
Eosinophils Absolute: 0.2 10*3/uL (ref 0.0–0.5)
HEMATOCRIT: 32.7 % — AB (ref 34.8–46.6)
HEMOGLOBIN: 10.6 g/dL — AB (ref 11.6–15.9)
LYMPH#: 0.4 10*3/uL — AB (ref 0.9–3.3)
LYMPH%: 8 % — ABNORMAL LOW (ref 14.0–49.7)
MCH: 26.9 pg (ref 25.1–34.0)
MCHC: 32.3 g/dL (ref 31.5–36.0)
MCV: 83.2 fL (ref 79.5–101.0)
MONO#: 0.5 10*3/uL (ref 0.1–0.9)
MONO%: 9.2 % (ref 0.0–14.0)
NEUT#: 3.9 10*3/uL (ref 1.5–6.5)
NEUT%: 77.9 % — ABNORMAL HIGH (ref 38.4–76.8)
Platelets: 234 10*3/uL (ref 145–400)
RBC: 3.93 10*6/uL (ref 3.70–5.45)
RDW: 18.9 % — AB (ref 11.2–14.5)
WBC: 5 10*3/uL (ref 3.9–10.3)

## 2015-03-21 NOTE — Progress Notes (Signed)
Thanks

## 2015-03-21 NOTE — Progress Notes (Signed)
She is finishing chemoRT on 9/15. She is doing well, rectal pain and diarrhea all improved. Thanks.   Truitt Merle  03/21/2015

## 2015-03-21 NOTE — H&P (Signed)
Lindsay Little 02/07/2015 9:42 AM Location: [facility] Patient #: [id number] DOB: Sep 13, 1965 Married / Language: [name] / Race: White Female  History of Present Illness [name] MD; 02/07/2015 10:27 AM) The patient is a 49 year old female who presents with colorectal cancer. Patient sent for surgical consultation by Dr. Teena Irani with [facility] gastroenterology. Diagnosis of proximal rectal cancer.  Morbidly obese female. History of Crohn's disease and possible IBS. Chronic abdominal pain. Usually managed in [place]. More recently managed by [facility] gastroenterology. Workup for upper abdominal pain with normal upper GI series and ultrasound aside from some fatty change. Liver masses consistent with cavernous hemangiomas. Recent MRI improved stability. Hent panendoscopy. Found to have bulky mass from 12?20 centimeters from anal verge suspicious for rectal cancer. Biopsy confirmed. Ultrasound stages as T3 and 2. Surgical, medical, radiation oncology consultations made. Patient comes to consider surgical aspects of her multidisciplinary care. Family history cardiac disease - father passed away from myocardial infarction at age 57 - but no major concerns according to her cardiologist Candee Furbish last year. Cholesterol treated.   Other Problems Lars Mage Spillers, CMA; 02/07/2015 9:43 AM) Back Pain Diverticulosis Gastroesophageal Reflux Disease General anesthesia - complications High blood pressure Hypercholesterolemia Other disease, cancer, significant illness Rectal Cancer Transfusion history  Past Surgical History Lars Mage Spillers, CMA; 02/07/2015 9:43 AM) Colon Polyp Removal - Colonoscopy Hysterectomy (not due to cancer) - Partial Knee Surgery Left. Oral Surgery Shoulder Surgery Left.  Diagnostic Studies History Illene Regulus, CMA; 02/07/2015 9:43 AM) Colonoscopy >10 years ago Mammogram 1-3 years ago Pap Smear 1-5 years  ago  Allergies Lars Mage Spillers, CMA; 02/07/2015 9:47 AM) Lidocaine *CHEMICALS* All of the Caine's except MARCAINE is ok per the pt Penicillin G Benzathine & Proc *PENICILLINS* Sulfabenzamide *CHEMICALS*  Medication History (Alisha Spillers, CMA; 02/07/2015 9:49 AM) DULoxetine HCl (60MG Capsule DR Part, Oral) Active. Esomeprazole Magnesium (40MG Capsule DR, Oral) Active. Furosemide (20MG Tablet, Oral) Active. Hydrochlorothiazide (25MG Tablet, Oral) Active. Hydrocodone-Acetaminophen (5-325MG Tablet, Oral) Active. Hyoscyamine Sulfate ER (0.375MG Tablet ER 12HR, Oral) Active. Meloxicam (15MG Tablet, Oral) Active. Crestor (5MG Tablet, Oral) Active. Bystolic (60YT Tablet, Oral) Active. Coenzyme Q10 (100MG Capsule, Oral) Active. Donnatal (16.2MG Tablet, Oral) Active. Robaxin (500MG Tablet, Oral) Active. Vitamin D (1000UNIT Tablet, Oral) Active. Medications Reconciled  Pregnancy / Birth History Illene Regulus, CMA; 02/07/2015 9:43 AM) Age at menarche 62 years. Age of menopause 62-50 Gravida 4 Irregular periods Maternal age 71-20 Para 0  Review of Systems Lars Mage Spillers CMA; 02/07/2015 9:43 AM) General Present- Appetite Loss, Fatigue and Weight Loss. Not Present- Chills, Fever, Night Sweats and Weight Gain. Skin Present- Non-Healing Wounds. Not Present- Change in Wart/Mole, Dryness, Hives, Jaundice, New Lesions, Rash and Ulcer. HEENT Present- Wears glasses/contact lenses. Not Present- Earache, Hearing Loss, Hoarseness, Nose Bleed, Oral Ulcers, Ringing in the Ears, Seasonal Allergies, Sinus Pain, Sore Throat, Visual Disturbances and Yellow Eyes. Respiratory Not Present- Bloody sputum, Chronic Cough, Difficulty Breathing, Snoring and Wheezing. Cardiovascular Present- Leg Cramps and Swelling of Extremities. Not Present- Chest Pain, Difficulty Breathing Lying Down, Palpitations, Rapid Heart Rate and Shortness of Breath. Gastrointestinal Present- Abdominal Pain,  Bloating, Bloody Stool, Change in Bowel Habits, Chronic diarrhea, Excessive gas, Gets full quickly at meals, Nausea and Rectal Pain. Not Present- Constipation, Difficulty Swallowing, Hemorrhoids, Indigestion and Vomiting. Female Genitourinary Present- Pelvic Pain. Not Present- Frequency, Nocturia, Painful Urination and Urgency. Musculoskeletal Present- Back Pain, Joint Pain and Muscle Weakness. Not Present- Joint Stiffness, Muscle Pain and Swelling of Extremities. Neurological Present- Headaches and Numbness. Not  Present- Decreased Memory, Fainting, Seizures, Tingling, Tremor, Trouble walking and Weakness. Psychiatric Not Present- Anxiety, Bipolar, Change in Sleep Pattern, Depression, Fearful and Frequent crying. Endocrine Not Present- Cold Intolerance, Excessive Hunger, Hair Changes, Heat Intolerance, Hot flashes and New Diabetes. Hematology Present- Easy Bruising. Not Present- Excessive bleeding, Gland problems, HIV and Persistent Infections.   Vitals (Alisha Spillers CMA; 02/07/2015 9:45 AM) 02/07/2015 9:45 AM Weight: 344 lb Height: 70.5in Body Surface Area: 2.79 m Body Mass Index: 48.66 kg/m Pulse: 68 (Regular)  BP: 124/74 (Sitting, Left Arm, Standard)    Physical Exam [name] MD; 02/07/2015 10:34 AM) General Mental Status-Alert. General Appearance-Not in acute distress, Not Sickly. Orientation-Oriented X3. Hydration-Well hydrated. Voice-Normal.  Integumentary Global Assessment Upon inspection and palpation of skin surfaces of the - Axillae: non-tender, no inflammation or ulceration, no drainage. and Distribution of scalp and body hair is normal. General Characteristics Temperature - normal warmth is noted.  Head and Neck Head-normocephalic, atraumatic with no lesions or palpable masses. Face Global Assessment - atraumatic, no absence of expression. Neck Global Assessment - no abnormal movements, no bruit auscultated on the right, no bruit  auscultated on the left, no decreased range of motion, non-tender. Trachea-midline. Thyroid Gland Characteristics - non-tender.  Eye Eyeball - Left-Extraocular movements intact, No Nystagmus. Eyeball - Right-Extraocular movements intact, No Nystagmus. Cornea - Left-No Hazy. Cornea - Right-No Hazy. Sclera/Conjunctiva - Left-No scleral icterus, No Discharge. Sclera/Conjunctiva - Right-No scleral icterus, No Discharge. Pupil - Left-Direct reaction to light normal. Pupil - Right-Direct reaction to light normal.  ENMT Ears Pinna - Left - no drainage observed, no generalized tenderness observed. Right - no drainage observed, no generalized tenderness observed. Nose and Sinuses External Inspection of the Nose - no destructive lesion observed. Inspection of the nares - Left - quiet respiration. Right - quiet respiration. Mouth and Throat Lips - Upper Lip - no fissures observed, no pallor noted. Lower Lip - no fissures observed, no pallor noted. Nasopharynx - no discharge present. Oral Cavity/Oropharynx - Tongue - no dryness observed. Oral Mucosa - no cyanosis observed. Hypopharynx - no evidence of airway distress observed.  Chest and Lung Exam Inspection Movements - Normal and Symmetrical. Accessory muscles - No use of accessory muscles in breathing. Palpation Palpation of the chest reveals - Non-tender. Auscultation Breath sounds - Normal and Clear.  Cardiovascular Auscultation Rhythm - Regular. Murmurs & Other Heart Sounds - Auscultation of the heart reveals - No Murmurs and No Systolic Clicks.  Abdomen Inspection Inspection of the abdomen reveals - No Visible peristalsis and No Abnormal pulsations. Umbilicus - No Bleeding, No Urine drainage. Palpation/Percussion Palpation and Percussion of the abdomen reveal - Soft, Non Tender, No Rebound tenderness, No Rigidity (guarding) and No Cutaneous hyperesthesia. Note: Morbidly obese but soft. Small lump felt that based  of umbilicus consistent with incarcerated hernia   Female Genitourinary Sexual Maturity Tanner 5 - Adult hair pattern. Note: No vaginal bleeding nor discharge   Rectal Note: Small LEFT lateral internal/external hemorrhoid. Mild anal tag anterior midline raphae. Normal sphincter tone. Sensitive. No mass felt to tip of finger but mild fullness felt anteriorly at tip. No discrete mass felt. No fissure. No fistula. No condyloma. No pilonidal disease   Peripheral Vascular Upper Extremity Inspection - Left - No Cyanotic nailbeds, Not Ischemic. Right - No Cyanotic nailbeds, Not Ischemic.  Neurologic Neurologic evaluation reveals -normal attention span and ability to concentrate, able to name objects and repeat phrases. Appropriate fund of knowledge , normal sensation and normal coordination. Mental  Status Affect - not angry, not paranoid. Cranial Nerves-Normal Bilaterally. Gait-Normal.  Neuropsychiatric Mental status exam performed with findings of-able to articulate well with normal speech/language, rate, volume and coherence, thought content normal with ability to perform basic computations and apply abstract reasoning and no evidence of hallucinations, delusions, obsessions or homicidal/suicidal ideation.  Musculoskeletal Global Assessment Spine, Ribs and Pelvis - no instability, subluxation or laxity. Right Upper Extremity - no instability, subluxation or laxity.  Lymphatic Head & Neck  General Head & Neck Lymphatics: Bilateral - Description - No Localized lymphadenopathy. Axillary  General Axillary Region: Bilateral - Description - No Localized lymphadenopathy. Femoral & Inguinal  Generalized Femoral & Inguinal Lymphatics: Left - Description - No Localized lymphadenopathy. Right - Description - No Localized lymphadenopathy.    Assessment & Plan ) RECTAL ADENOCARCINOMA (154.1  C20) Impression: I think she would benefit from multidisciplinary treatment.  Given  the T3 N2 nature, agree with neoadjuvant chemoradiation therapy. Rectosigmoid but by CT scan distal end definitely in pelvis. No evidence of metastatic disease by abdomen and pelvis and MRI. CT chest pending today. CEA only 1.4. Would plan low anterior resection 10 weeks after completion of neoadjuvant chemoradiation therapy. Hopefully proximal enough to not require ileostomy diversion since I cannot feel it, but we will see. Given her severe morbid obesity, would prefer robotic or at least a minimally invasive approach. She is very interested in that.  Said some time discussing the pathophysiology, need for multidisciplinary approach, surgery, expected recovering follow-up. Questions answered. She and husband expressed appreciation and wished to proceed. Current Plans  Schedule for Surgery The anatomy & physiology of the digestive tract was discussed. The pathophysiology of the rectal pathology was discussed. Natural history risks without surgery was discussed. I worked to give an overview of the disease and the frequent need to have multispecialty involvement. I feel the risks of no intervention will lead to serious problems that outweigh the operative risks; therefore, I recommended a partial proctocolectomy to remove the pathology. Minimally Invasive (Robotic/Laparoscopic) & open techniques were discussed. We will work to preserve anal & pelvic floor function without sacrificing cure.  Risks such as bleeding, infection, abscess, leak, reoperation, possible temporary or permanent ostomy, hernia, heart attack, death, and other risks were discussed. I noted a good likelihood this will help address the problem. Goals of post-operative recovery were discussed as well. We will work to minimize complications. Educational information was available as well. Questions were answered. The patient expresses understanding & wishes to proceed with surgery. Pt Education - Pamphlet Given - Colorectal Surgery: discussed  with patient and provided information. Pt Education - [facility] Colorectal Cancer (AT) Pt Education - [facility] Pelvic Floor Exercises (Kegels) and Dysfunction HCI (Allisha Harter) Pt Education - [place] (Mareo Portilla) Pt Education - [facility] Abdominal Surgery (Denym Christenberry) Started Neomycin Sulfate 500MG, 2 (two) Tablet SEE NOTE, #6, 02/07/2015, No Refill. Local Order: TAKE TWO TABLETS AT 2 PM, 3 PM, AND 10 PM THE DAY PRIOR TO SURGERY Started Flagyl 500MG, 2 (two) Tablet SEE NOTE, #6, 02/07/2015, No Refill. Local Order: Take at 2pm, 3pm, and 10pm the day prior to your colon operation Pt Education - [facility] Colon Bowel Prep 2015 Miralax/Antibiotics INCISIONAL HERNIA, INCARCERATED (552.21  K43.0) Impression: Small umbilical incisional hernia most likely from its numerous diagnostic laparoscopies to rule out endometriosis. May benefit repair at the time of surgery but most likely we'll hold off unless symptomatic. Morbid obesity higher risk of hernia at that location. Usually I prefer to extract at the  stapler port site or Pfannenstiel region to lower hernia and pain risks.    Signed by [name], MD    [name], M.D., F.A.C.S. Gastrointestinal and Minimally Invasive Surgery [facility] [address] [place], [place] [address] [phone] Main / Paging

## 2015-03-21 NOTE — Telephone Encounter (Signed)
per pof to sch pt appt-gave pt copy of avs °

## 2015-03-21 NOTE — Progress Notes (Signed)
[facility]  [phone] [phone]  Clinic New Consult Note   Patient Care Team: [name], MD as PCP - General (Family Medicine) Michael Boston, MD as Consulting Physician (General Surgery) Truitt Merle, MD as Consulting Physician (Medical Oncology) Kyung Rudd, MD as Consulting Physician (Radiation Oncology) Teena Irani, MD as Consulting Physician (Gastroenterology) 03/21/2015  CHIEF COMPLAINTS/PURPOSE OF CONSULTATION:  Follow up rectal cancer   Oncology History   Rectal adenocarcinoma   Staging form: Colon and Rectum, AJCC 7th Edition     Clinical: T3, N2, M0 - Unsigned       Rectal adenocarcinoma   01/13/2015 Initial Diagnosis Rectal adenocarcinoma   01/13/2015 Procedure Colonoscopy showed a sensitivity O nonobstructing mass in the rectum and from 12-18 cm proximal to the Annis. The mass was circumferential, measuring about 6 cm in length. EGD was negative.   01/17/2015 Tumor Marker CEA 1.4, CA-19-9 8    01/18/2015 Procedure Lower endoscopic ultrasound by Dr. Paulita Fujita showed a T3 N2 rectal mass.   01/20/2015 Imaging CT abdomen and pelvis with contrast showed right lateral rectal wall exophytic mass, and 2 ill-defined hepatic hypoenhancing lesions which appears to correspond to the lesion seen on the prior MRI in 2015.   02/05/2015 Imaging abdomen MRI showed 2 hemangioma, no suspicion for metastatic disease. CT chest was negative   02/20/2015 -  Radiation Therapy neoadjuvant RT to rectal cancer    02/20/2015 Concurrent Chemotherapy capecitabine 2500 mg in the morning and 2000 mg in the evening (818m/m2, bid), on the day of radiation    HISTORY OF PRESENTING ILLNESS:  DKENSEY [id number]. female is here because of evaluation for management for newly diagnosed rectal adenocarcinoma.   On 09/06/13, she presented to Dr. HStarr SinclairGI] with 464-monthistory of diarrhea and worsening abdominal pain. She has a history of chronic abdominal pain and had been  previously [place] by Dr. HaAmedeo PlentyAbdominal [place] noted to have some hypoechoicity in the left hepatic lobe which was confirmed on MRI to be two hemangiomas  in the right hepatic lobe of measuring up to 3.0 cm along with an additional 7 mm probable cyst.   She presented with worsening of diarrhea for 6 month and was reevaluated by Dr. HaAmedeo PlentyShe was seen on 12/06/14 at which time it was decided she would undergo HIDA scan given that the pain was intermittently in the RUQ in addition to upper and lower endoscopy. HIDA scan 12/15/14 was reassuring, but she was found to have a malignant tumor of the rectum about 12-18 cm proximal to the anus, gastritis, gastric polyps on endoscopies 01/13/15. Biopsies for these three specimens along with the duodenum were collected and resulted on 01/17/15 for invasive adenocarcinoma, chronic gastritis [H. Pylori negative], fundic gland polyps, and non-specific mild patchy intra-epithelial lymphocytes. Endoscopic [place]16 staged the mass at T3N2Mx and she is here today for consideration of neoadjuvant therapy.    Today, she does report abdominal pain but is no different than her baseline [right-sided, upper, lower] but has had worsening appetite over the last 2 months. She does have diarrhea with bowel movements every 1-2 hours, especially after eating, and has been noticing bleeding with her bowel movement following her endoscopic evaluation. She also feels her energy level has decreased. Otherwise, she denies nausea, chest pain, shortness of breath.    Family history is notable for a maternal uncle who  had pancreatic cancer diagnosed in his mid to late 60s that spread to the colon and prostate before he died about 10 years later and a sister with non-melanoma skin cancer. 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 She smoked  roughly 20 pack years [1 pack/week x 7 years] in the past and reports occasional alcohol use but denies any prior illicit drug use.  She lost about 7 lbs in the past 2 months  CURRENT THERAPY: concurrent chemoradiation, with capecitabine 2569m in the morning and 2000 mg in the evening, on the day of radiation (8233mm2 bid), started on 02/20/2015   INTERIM HISTORY DeHilda Bladeseturns for follow-up and week 5 concurrent chemotherapy and radiation.  She is tolerating the treatment well. She did have quite a severe rectal pain last week, along with nausea and poor appetite, and the pain improved after a few days, she feels pretty well this week. Her diarrhea has improved, and the rectal bleeding has stopped recently. She is tolerating daily radiation without any missing treatment.    HISTORY:  Past Medical History  Diagnosis Date  . Hypertension   . Hypercholesteremia   . Obesity   . Tear of left rotator cuff   . IBS (irritable bowel syndrome)   . Vertigo   . TMJ (dislocation of temporomandibular joint)   . Bulging disc     L5  . Mild sleep apnea   . Depression   . Rectal bleeding   . Vitamin D deficiency   . Osteoarthritis, knee     SURGICAL HISTORY: Past Surgical History  Procedure Laterality Date  . Rotator cuff repair    . Eus N/A 01/18/2015    Procedure: LOWER ENDOSCOPIC ULTRASOUND (EUS);  Surgeon: WiArta SilenceMD;  [facility];  Service: Endoscopy;  Laterality: N/A;  . Abdominal hysterectomy  1996    Removed uterus, and tubes    SOCIAL HISTORY: Social History   Social History  . Marital Status: Married    Spouse Name: N/A  . Number of Children: N/A  . Years of Education: N/A   Occupational History  . Not on file.   Social History Main Topics  . Smoking status: Former SmResearch scientist (life sciences). Smokeless tobacco: Not on file     Comment: 1 pack/week intermittently x 7 years  . Alcohol Use: 0.6 oz/week    1 Shots of liquor per week  . Drug Use: Not on file  . Sexual  Activity: Not on file   Other Topics Concern  . Not on file   Social History Narrative   Tobacco Use: Cigarettes - Former Smoker   Alcohol: Yes, very rare, liquor.    No recreational drug use   Occupation: Head CMA @ [employer] Marital Status: Married    Husband: JaRoselyn Reefisabled   Children: 2 adopted kids Ma[place]adsworth [place] [employer] [place] Family History  Problem Relation Age of Onset  . Coronary artery disease Mother [id number]. Hypertension Mother   . Hyperlipidemia Mother   . Diabetes Mellitus I Mother   . Coronary artery disease Father   . Hyperlipidemia Father   . Hypertension Father   . Cancer Sister     skin - non melanoma  . Hyperlipidemia Brother   . Cancer Maternal Uncle [id number]  pancreatic with mets to colon and prostate  . Cirrhosis Maternal Uncle   . Hypertension Maternal Grandmother   . Diabetes Mellitus I Maternal Grandmother   . Hyperlipidemia Maternal Grandmother   . CVA Maternal Grandmother   . Hypertension Maternal Grandfather   . Coronary artery disease Maternal Grandfather 65  . Hyperlipidemia Maternal Grandfather   . Coronary artery disease Paternal Grandmother   . Hypertension Paternal Grandmother   . Hyperlipidemia Paternal Grandmother   . Diabetes Mellitus I Paternal Grandmother   . Hypertension Paternal Grandfather   . Hyperlipidemia Paternal Grandfather   . Coronary artery disease Paternal Grandfather     ALLERGIES:  is allergic to caine-1; penicillins; and sulfa antibiotics.  MEDICATIONS:  Current Outpatient Prescriptions  Medication Sig Dispense Refill  . belladona alk-PHENObarbital (DONNATAL) 16.2 MG tablet Take 1 tablet by mouth daily.     . capecitabine (XELODA) 500 MG tablet Take 5 tab in the morning and 4 tab in the evening, after meals, on the day of radiation (off on weekend). 72 tablet 0  . Coenzyme Q10 (COQ-10) 100 MG CAPS Take 100 mg by mouth daily.    .  diphenoxylate-atropine (LOMOTIL) 2.5-0.025 MG per tablet Take 1 tablet by mouth 4 (four) times daily as needed for diarrhea or loose stools. 30 tablet 2  . DULoxetine (CYMBALTA) 60 MG capsule Take 60 mg by mouth daily.    Marland Kitchen esomeprazole (NEXIUM) 40 MG capsule Take 40 mg by mouth 2 (two) times daily.    . ferrous sulfate 325 (65 FE) MG tablet Take 325 mg by mouth 2 (two) times daily with a meal.    . furosemide (LASIX) 20 MG tablet Take 20 mg by mouth as needed for fluid or edema.     Marland Kitchen HYDROcodone-acetaminophen (NORCO/VICODIN) 5-325 MG per tablet Take 2 tablets by mouth every 4 (four) hours as needed for moderate pain (every 4 to 6 hours).    . hydrocortisone 2.5 % cream Apply 1 application topically as needed.    . hyoscyamine (LEVBID) 0.375 MG 12 hr tablet Take 0.375 mg by mouth 2 (two) times daily.    Marland Kitchen loperamide (IMODIUM) 2 MG capsule Take 2 mg by mouth daily as needed for diarrhea or loose stools.     . meloxicam (MOBIC) 15 MG tablet Take 15 mg by mouth daily.    . methocarbamol (ROBAXIN) 500 MG tablet Take 500 mg by mouth as needed for muscle spasms.    . nebivolol (BYSTOLIC) 5 MG tablet Take 5 mg by mouth daily.    . ondansetron (ZOFRAN) 8 MG tablet Take 1 tablet (8 mg total) by mouth every 8 (eight) hours as needed for nausea. (Patient not taking: Reported on 03/17/2015) 20 tablet 3  . prochlorperazine (COMPAZINE) 10 MG tablet Take 1 tablet (10 mg total) by mouth every 6 (six) hours as needed. 30 tablet 3  . rosuvastatin (CRESTOR) 5 MG tablet Take 5 mg by mouth daily.    . Vitamin D, Ergocalciferol, (DRISDOL) 50000 UNITS CAPS capsule Take 50,000 Units by mouth 2 (two) times a week.     No current facility-administered medications for this visit.    REVIEW OF SYSTEMS:   Constitutional: Denies fevers, chills or abnormal night sweats Eyes: Denies blurriness of vision, double vision or watery eyes Ears, nose, mouth, throat, and face: Denies mucositis or sore throat Respiratory: Denies  cough, dyspnea or wheezes Cardiovascular: Denies palpitation, chest discomfort or lower extremity swelling Gastrointestinal:  Diarrhea, abdominal pain, hematochezia Skin: Denies abnormal  skin rashes Lymphatics: Denies new lymphadenopathy or easy bruising Neurological:Denies numbness, tingling or new weaknesses Behavioral/Psych: Mood is stable, no new changes  All other systems were reviewed with the patient and are negative.  PHYSICAL EXAMINATION: ECOG PERFORMANCE STATUS: 1 - Symptomatic but completely ambulatory  Filed Vitals:   03/21/15 1022  BP: 124/66  Pulse: 66  Temp: 98.7 F (37.1 C)  Resp: 18   Filed Weights   03/21/15 1022  Weight: 337 lb 6.4 oz (153.044 kg)    GENERAL:alert, no distress and comfortable SKIN: skin color, texture, turgor are normal, no rashes or significant lesions EYES: normal, conjunctiva are pink and non-injected, sclera clear OROPHARYNX:no exudate, no erythema and lips, buccal mucosa, and tongue normal  NECK: supple, thyroid normal size, non-tender, without nodularity LYMPH:  no palpable lymphadenopathy in the cervical, axillary or inguinal LUNGS: clear to auscultation and percussion with normal breathing effort HEART: regular rate & rhythm and no murmurs and no lower extremity edema ABDOMEN:abdomen soft, normal bowel sounds. Small area of (+) small area of skin pigmentation in perianal area, no skin ulcers or erythema. Musculoskeletal:no cyanosis of digits and no clubbing  PSYCH: alert & oriented x 3 with fluent speech NEURO: no focal motor/sensory deficits  LABORATORY DATA:  I have reviewed the data as listed CBC Latest Ref Rng 03/13/2015 03/08/2015 03/06/2015  WBC 3.9 - 10.3 10e3/uL 5.8 6.2 5.3  Hemoglobin 11.6 - 15.9 g/dL 9.7(L) 9.9(L) 9.8(L)  Hematocrit 34.8 - 46.6 % 30.3(L) 32.3(L) 30.5(L)  Platelets 145 - 400 10e3/uL 249 281 299    CMP Latest Ref Rng 03/13/2015 03/08/2015 03/06/2015  Glucose 70 - 140 mg/dl 83 101(H) 95  BUN 7.0 - 26.0 mg/dL  9.6 11 8.9  Creatinine 0.6 - 1.1 mg/dL 0.7 0.80 0.7  Sodium 136 - 145 mEq/L 141 137 141  Potassium 3.5 - 5.1 mEq/L 4.1 3.8 4.2  Chloride 101 - 111 mmol/L - 102 -  CO2 22 - 29 mEq/L _0 Calcium 8.4 - 10.4 mg/dL 9.3 9.0 9.8  Total Protein 6.4 - 8.3 g/dL 6.4 7.0 6.5  Total Bilirubin 0.20 - 1.20 mg/dL 0.32 0.4 0.34  Alkaline Phos 40 - 150 U/L 53 53 58  AST 5 - 34 U/L _1 ALT 0 - 55 U/L _2 Diagnosis 01/13/2015 1. Duodenum, Biopsy - MILD PATCHY INCREASE IN INTRAEPITHELIAL LYMPHOCYTES, SEE COMMENT. - NO DYSPLASIA OR MALIGNANCY. 2. Stomach, polyp(s), gastric - FUNDIC GLAND POLYPS. - NO INTESTINAL METAPLASIA, DYSPLASIA, OR MALIGNANCY. 3. Stomach, biopsy, gastric antrum - CHRONIC GASTRITIS. - NEGATIVE FOR HELICOBACTER PYLORI. - NO INTESTINAL METAPLASIA, DYSPLASIA, OR MALIGNANCY. 4. Rectum, biopsy, rectal mass - INVASIVE ADENOCARCINOMA. Microscopic Comment 1. There is a patchy mild increase in intraepithelial lymphocytes, including at the tips of villi. However, there is no significant villous blunting. This pattern is non-specific and can be seen in celiac disease, NSAID injury, H. pylori infection, etc. 3. A Warthin-Starry stain is performed to determine the possibility of the presence of Helicobacter pylori. The Warthin-Starry stain is negative for organisms of Helicobacter pylori. 4. Dr. Saralyn Pilar has reviewed the case. The case was called to Dr. Amedeo Plenty' office on 01/17/2015.   RADIOGRAPHIC STUDIES: I have personally reviewed the radiological images as listed and agreed with the findings in the report.  Ct Abdomen Pelvis W Contrast 01/20/2015    IMPRESSION: Right lateral rectal wall exophytic mass likely corresponding to the known neoplasm.  Two ill-defined hepatic hypo enhancing lesions which appears  to correspond to the lesions seen on the prior MRI. Please note evaluation of the liver is very limited due to streak artifact. If there is clinical concern for new  metastatic disease MRI may provide better evaluation.  Small left ovarian cystic lesion corresponding to the lesion reported on the prior pelvic ultrasound. Ultrasound may provide better evaluation of the pelvic structures.   Electronically Signed   By: Anner Crete M.D.   On: 01/20/2015 16:15   Lower endoscopic ultrasound by Dr. Paulita Fujita 01/18/2015 FINDINGS: Normal digital rectal exam; could not palpate mass. In proximal rectum extending to the distal rectum, from 12 cm to 20 cm from the anal verge, a partial circumferential (50-75% circumferential distally, 75-100% circumferential proximall) mass was seen. Mass was firm, fixed, ulcerated, friable. EUS radial scope was able to traverse the tumor into normal-appearing distal sigmoid colon. Lesion invaded through the wall of the rectum into neighboring connective tissues in multiple regions. There were several round hypoechoic well-defined malignant-appearing lymph nodes in the vicinity of the proximal portion of the tumor. STAGING: T3 N2 Mx via rectal ultrasound ENDOSCOPIC IMPRESSION: As above. Rectosigmoid mass, biopsy-proven adenocarcinoma, with local invasion noted under ultrasound. RECOMMENDATIONS: 1. Watch for potential complications of procedure. 2. Needs CT chest, abdomen, pelvis to complete staging. 3. Based already on rectal ultrasound results, patient would not be candidate for upfront surgery. She will need surgical and radiation oncology consultations. 4. Will discuss with Dr. Amedeo Plenty.  CT chest 02/07/2015 IMPRESSION: 1. No findings of metastatic disease to the chest.  Abdomen MRI w and wo contrast 02/05/2015  IMPRESSION: 1. Small cavernous hemangiomas in segments 5 and 7 of the liver redemonstrated, as above. Additional 7 mm indeterminate lesion is also favored to represent a tiny cavernous hemangioma, but may alternatively represent a small cyst. No new suspicious appearing hepatic lesions to suggest presence of metastatic  disease at this time. 2. Mild hepatic steatosis.   ASSESSMENT & PLAN: Ms. Bratcher is a 49 year old female with chronic abdominal pain with rectal mass on colonoscopy found to be invasive rectal adenocarcinoma.   1. T3N2M0, Stage IIIB/C rectal adenocarcinoma -We reviewed with the patient her biopsy and imaging results along with records sent to [place] from her GI doctor. We explained to her the significance of her tumor markers, CEA and CA 19-9, and their low levels as favorable.  -I reviewed her CT chest and abdomen MRI scan results, which showed no evidence of distant metastasis. Liver lesions are consistent with hemangioma.  -she has stage IIIc disease. Given the locally advanced tumor, we recommended undergoing neoadjuvant therapy with either 5-FU or capecitabine followed by surgical management and adjuvant chemotherapy. She opted capecitabine  -She is tolerating concurrent chemoradiation with some expected side effects, lab reviewed, adequate, we'll continue Xeloda with concurrent radiation. -she is finishing treatment next week   2. Microcytic anemia, secondary to GI blood loss and iron deficiency anemia - Likely in the setting of ongoing blood loss from her malignancy.  -Her initial lab showed a ferritin of 28, serum iron 40, saturation 12%. Consistent with mild iron deficiency. She is taking ferrous sulfate once daily -We discussed if she is not able to tolerate oral iron, we'll consider IV Feraheme, she would like to try oral pill first. -Her Hb today is 9.7, stable, no need blood transfusion.  -will continue monitoring    3. Diarrhea:  related to her cancer.  -much improved   4. Rectal pain -related to her cancer and RT  -mild, overall  improved   Plan: -continue concurrent chemoradiation with Xeloda, week 5 now  -RTC next week    I spent 20 mins for the visit, >50% face to face discussion.   Truitt Merle  03/21/2015

## 2015-03-22 ENCOUNTER — Ambulatory Visit
Admission: RE | Admit: 2015-03-22 | Discharge: 2015-03-22 | Disposition: A | Discharge status: Home / Self Care | Payer: BLUE CROSS/BLUE SHIELD | Source: Ambulatory Visit | Attending: Radiation Oncology | Admitting: Radiation Oncology

## 2015-03-22 DIAGNOSIS — Z510 Encounter for antineoplastic radiation therapy: Secondary | ICD-10-CM | POA: Diagnosis not present

## 2015-03-23 ENCOUNTER — Ambulatory Visit
Admission: RE | Admit: 2015-03-23 | Discharge: 2015-03-23 | Disposition: A | Discharge status: Home / Self Care | Payer: BLUE CROSS/BLUE SHIELD | Source: Ambulatory Visit | Attending: Radiation Oncology | Admitting: Radiation Oncology

## 2015-03-23 DIAGNOSIS — Z510 Encounter for antineoplastic radiation therapy: Secondary | ICD-10-CM | POA: Diagnosis not present

## 2015-03-24 ENCOUNTER — Ambulatory Visit
Admission: RE | Admit: 2015-03-24 | Discharge: 2015-03-24 | Disposition: A | Discharge status: Home / Self Care | Payer: BLUE CROSS/BLUE SHIELD | Source: Ambulatory Visit | Attending: Radiation Oncology | Admitting: Radiation Oncology

## 2015-03-24 ENCOUNTER — Encounter: Payer: Self-pay | Admitting: Radiation Oncology

## 2015-03-24 VITALS — BP 121/99 | HR 70 | Temp 98.2°F | Resp 20 | Wt 341.0 lb

## 2015-03-24 DIAGNOSIS — Z510 Encounter for antineoplastic radiation therapy: Secondary | ICD-10-CM | POA: Diagnosis not present

## 2015-03-24 DIAGNOSIS — C20 Malignant neoplasm of rectum: Secondary | ICD-10-CM

## 2015-03-24 NOTE — Progress Notes (Signed)
Department of Radiation Oncology  Phone:  [phone] Fax:        [phone]  Weekly Treatment Note    Name: Lindsay Little Date: 03/24/2015 MRN: [id number] DOB: 06-Mar-1966   Current dose: 43.2 Gy  Current fraction:  24   MEDICATIONS: Current Outpatient Prescriptions  Medication Sig Dispense Refill  . belladona alk-PHENObarbital (DONNATAL) 16.2 MG tablet Take 1 tablet by mouth daily.     . capecitabine (XELODA) 500 MG tablet Take 5 tab in the morning and 4 tab in the evening, after meals, on the day of radiation (off on weekend). 72 tablet 0  . Coenzyme Q10 (COQ-10) 100 MG CAPS Take 100 mg by mouth daily.    . diphenoxylate-atropine (LOMOTIL) 2.5-0.025 MG per tablet Take 1 tablet by mouth 4 (four) times daily as needed for diarrhea or loose stools. 30 tablet 2  . DULoxetine (CYMBALTA) 60 MG capsule Take 60 mg by mouth daily.    Marland Kitchen esomeprazole (NEXIUM) 40 MG capsule Take 40 mg by mouth 2 (two) times daily.    . ferrous sulfate 325 (65 FE) MG tablet Take 325 mg by mouth 2 (two) times daily with a meal.    . furosemide (LASIX) 20 MG tablet Take 20 mg by mouth as needed for fluid or edema.     Marland Kitchen HYDROcodone-acetaminophen (NORCO/VICODIN) 5-325 MG per tablet Take 2 tablets by mouth every 4 (four) hours as needed for moderate pain (every 4 to 6 hours).    . hydrocortisone 2.5 % cream Apply 1 application topically as needed.    . hyoscyamine (LEVBID) 0.375 MG 12 hr tablet Take 0.375 mg by mouth 2 (two) times daily.    Marland Kitchen loperamide (IMODIUM) 2 MG capsule Take 2 mg by mouth daily as needed for diarrhea or loose stools.     . meloxicam (MOBIC) 15 MG tablet Take 15 mg by mouth daily.    . methocarbamol (ROBAXIN) 500 MG tablet Take 500 mg by mouth as needed for muscle spasms.    . nebivolol (BYSTOLIC) 5 MG tablet Take 5 mg by mouth daily.    . ondansetron (ZOFRAN) 8 MG tablet Take 1 tablet (8 mg total) by mouth every 8 (eight) hours as needed for nausea. 20 tablet 3  . prochlorperazine  (COMPAZINE) 10 MG tablet Take 1 tablet (10 mg total) by mouth every 6 (six) hours as needed. 30 tablet 3  . rosuvastatin (CRESTOR) 5 MG tablet Take 5 mg by mouth daily.    . Vitamin D, Ergocalciferol, (DRISDOL) 50000 UNITS CAPS capsule Take 50,000 Units by mouth 2 (two) times a week.     No current facility-administered medications for this encounter.     ALLERGIES: Caine-1; Penicillins; and Sulfa antibiotics   LABORATORY DATA:  Lab Results  Component Value Date   WBC 5.0 03/21/2015   HGB 10.6* 03/21/2015   HCT 32.7* 03/21/2015   MCV 83.2 03/21/2015   PLT 234 03/21/2015   Lab Results  Component Value Date   NA 141 03/21/2015   K 4.3 03/21/2015   CL 102 03/08/2015   CO2 28 03/21/2015   Lab Results  Component Value Date   ALT 16 03/21/2015   AST 11 03/21/2015   ALKPHOS 62 03/21/2015   BILITOT 0.41 03/21/2015     NARRATIVE: Lindsay Little was seen today for weekly treatment management. The chart was checked and the patient's films were reviewed.  Weekly rad txs 24 completd rectal, no nausea since she started taking compazine 30  minutes before her Xeloda,  No pain, 5-6 stools more formed daily stated, appetite fair, ennergy level some better,  Gas in abdomen, takes gas ex, no skin breakdown, not using sitz bath, using babay wipes 5:35 PM   PHYSICAL EXAMINATION: weight is 341 lb (154.677 kg). Her oral temperature is 98.2 F (36.8 C). Her blood pressure is 121/99 and her pulse is 70. Her respiration is 20.        ASSESSMENT: The patient is doing satisfactorily with treatment.She continues to do much, much better with treatment.  PLAN: We will continue with the patient's radiation treatment as planned.

## 2015-03-24 NOTE — Progress Notes (Signed)
Weekly rad txs 24 completd rectal, no nausea since she started taking compazine 30 minutes before her Xeloda,  No pain, 5-6 stools more formed daily stated, appetite fair, ennergy level some better,  Gas in abdomen, takes gas ex, no skin breakdown, not using sitz bath, using babay wipes 11:08 AM

## 2015-03-24 NOTE — Progress Notes (Signed)
  Radiation Oncology         [phone] ________________________________  Name: Lindsay Little MRN: [id number]  Date: 03/24/2015  DOB: February 26, 1966  COMPLEX SIMULATION  NOTE  Diagnosis: rectal cancer  Narrative The patient has initially been planned to receive a course of radiation treatment to a dose of 45 gray in 25 fractions at 1.8 gray per fraction. The patient will now receive a boost to the high risk target volume for an additional 5.4 gray. This will be delivered in 3 fractions at 1.8 gray per fraction and a cone down boost technique will be utilized. To accomplish this, an additional 4 customized blocks have been designed for this purpose. A complex isodose plan is requested to ensure that the high-risk target region receives the appropriate radiation dose and that the nearby normal structures continue to be appropriately spared. The patient's final total dose therefore will be 50.4 gray.   ________________________________ ------------------------------------------------  Jodelle Gross, MD, PhD

## 2015-03-27 ENCOUNTER — Encounter: Payer: Self-pay | Admitting: *Deleted

## 2015-03-27 ENCOUNTER — Ambulatory Visit (HOSPITAL_BASED_OUTPATIENT_CLINIC_OR_DEPARTMENT_OTHER): Payer: BLUE CROSS/BLUE SHIELD | Admitting: Nurse Practitioner

## 2015-03-27 ENCOUNTER — Other Ambulatory Visit (HOSPITAL_BASED_OUTPATIENT_CLINIC_OR_DEPARTMENT_OTHER): Payer: BLUE CROSS/BLUE SHIELD

## 2015-03-27 ENCOUNTER — Ambulatory Visit
Admission: RE | Admit: 2015-03-27 | Discharge: 2015-03-27 | Disposition: A | Discharge status: Home / Self Care | Payer: BLUE CROSS/BLUE SHIELD | Source: Ambulatory Visit | Attending: Radiation Oncology | Admitting: Radiation Oncology

## 2015-03-27 VITALS — BP 121/91 | HR 74 | Temp 98.9°F | Resp 18 | Ht 71.0 in | Wt 337.7 lb

## 2015-03-27 DIAGNOSIS — D500 Iron deficiency anemia secondary to blood loss (chronic): Secondary | ICD-10-CM | POA: Diagnosis not present

## 2015-03-27 DIAGNOSIS — C20 Malignant neoplasm of rectum: Secondary | ICD-10-CM | POA: Diagnosis not present

## 2015-03-27 DIAGNOSIS — K922 Gastrointestinal hemorrhage, unspecified: Secondary | ICD-10-CM

## 2015-03-27 DIAGNOSIS — Z510 Encounter for antineoplastic radiation therapy: Secondary | ICD-10-CM | POA: Diagnosis not present

## 2015-03-27 LAB — COMPREHENSIVE METABOLIC PANEL (CC13)
ALT: 16 U/L (ref 0–55)
ANION GAP: 6 meq/L (ref 3–11)
AST: 14 U/L (ref 5–34)
Albumin: 3.6 g/dL (ref 3.5–5.0)
Alkaline Phosphatase: 64 U/L (ref 40–150)
BUN: 12.4 mg/dL (ref 7.0–26.0)
CHLORIDE: 110 meq/L — AB (ref 98–109)
CO2: 26 meq/L (ref 22–29)
Calcium: 9.6 mg/dL (ref 8.4–10.4)
Creatinine: 0.7 mg/dL (ref 0.6–1.1)
Glucose: 110 mg/dl (ref 70–140)
POTASSIUM: 4.1 meq/L (ref 3.5–5.1)
Sodium: 142 mEq/L (ref 136–145)
Total Bilirubin: 0.31 mg/dL (ref 0.20–1.20)
Total Protein: 6.6 g/dL (ref 6.4–8.3)

## 2015-03-27 LAB — CBC WITH DIFFERENTIAL/PLATELET
BASO%: 0.5 % (ref 0.0–2.0)
Basophils Absolute: 0 10*3/uL (ref 0.0–0.1)
EOS ABS: 0.2 10*3/uL (ref 0.0–0.5)
EOS%: 4.5 % (ref 0.0–7.0)
HCT: 32.6 % — ABNORMAL LOW (ref 34.8–46.6)
HGB: 10.4 g/dL — ABNORMAL LOW (ref 11.6–15.9)
LYMPH%: 6.6 % — AB (ref 14.0–49.7)
MCH: 26.9 pg (ref 25.1–34.0)
MCHC: 31.9 g/dL (ref 31.5–36.0)
MCV: 84.3 fL (ref 79.5–101.0)
MONO#: 0.4 10*3/uL (ref 0.1–0.9)
MONO%: 6.8 % (ref 0.0–14.0)
NEUT#: 4.4 10*3/uL (ref 1.5–6.5)
NEUT%: 81.6 % — AB (ref 38.4–76.8)
PLATELETS: 218 10*3/uL (ref 145–400)
RBC: 3.86 10*6/uL (ref 3.70–5.45)
RDW: 21.8 % — ABNORMAL HIGH (ref 11.2–14.5)
WBC: 5.4 10*3/uL (ref 3.9–10.3)
lymph#: 0.4 10*3/uL — ABNORMAL LOW (ref 0.9–3.3)

## 2015-03-27 NOTE — Progress Notes (Signed)
Oncology Nurse Navigator Documentation  Oncology Nurse Navigator Flowsheets 03/27/2015  Referral date to RadOnc/MedOnc -  Navigator Encounter Type Treatment  Patient Visit Type Medonc  Treatment Phase Treatment  Barriers/Navigation Needs Family concerns-short term disability  Education -  Interventions Forms taken to managed care to complete-needs to complete RT/chemo and recovery time prior to surgery in November. Employer requires she be able to return to work full time for 30 days before she can take STD again for her surgery.  Coordination of Care -  Education Method -  Support Groups/Services -  Time Spent with Patient 15

## 2015-03-27 NOTE — Progress Notes (Signed)
[facility] OFFICE PROGRESS NOTE   Diagnosis:  Rectal cancer   Oncology History   Rectal adenocarcinoma  Staging form: Colon and Rectum, AJCC 7th Edition  Clinical: T3, N2, M0 - Unsigned       Rectal adenocarcinoma   01/13/2015 Initial Diagnosis Rectal adenocarcinoma   01/13/2015 Procedure Colonoscopy showed a nonobstructing mass in the rectum and from 12-18 cm proximal to the anus. The mass was circumferential, measuring about 6 cm in length. EGD was negative.   01/17/2015 Tumor Marker CEA 1.4, CA-19-9 8    01/18/2015 Procedure Lower endoscopic ultrasound by Dr.  Paulita Fujita showed a T3 N2 rectal mass.   01/20/2015 Imaging CT abdomen and pelvis with contrast showed right lateral rectal wall exophytic mass, and 2 ill-defined hepatic hypoenhancing lesions which appears to correspond to the lesion seen on the prior MRI in 2015.   02/05/2015 Imaging abdomen MRI showed 2 hemangioma, no suspicion for metastatic disease. CT chest was negative   02/20/2015 -  Radiation Therapy neoadjuvant RT to rectal cancer    02/20/2015 Concurrent Chemotherapy capecitabine 2500 mg in the morning and 2000 mg in the evening (859m/m2, bid), on the day of radiation                  INTERVAL HISTORY:   Ms. HLevelreturns as scheduled. She continues radiation and Xeloda. Nausea is controlled as longer she takes nausea medication prior to each dose of Xeloda. No mouth sores. She notes improvement in the diarrhea. Stools are more formed. Rectal bleeding is better as well. She denies pain. No hand or foot pain or redness.  Objective:  Vital signs in last 24 hours:  Blood pressure 121/91, pulse 74, temperature 98.9 F (37.2 C), temperature source Oral, resp. rate 18, height 5' 11"  (1.803 m), weight 337 lb 11.2 oz (153.18 kg), SpO2 99 %.    HEENT: No thrush or ulcers. Resp: Lungs clear  bilaterally. Cardio: Regular rate and rhythm. GI: Abdomen soft and nontender. Vascular: No leg edema. Skin: Perianal skin is hyperpigmented. No skin breakdown.    Lab Results:  Lab Results  Component Value Date   WBC 5.4 03/27/2015   HGB 10.4* 03/27/2015   HCT 32.6* 03/27/2015   MCV 84.3 03/27/2015   PLT 218 03/27/2015   NEUTROABS 4.4 03/27/2015    Imaging:  No results found.  Medications: I have reviewed the patient's current medications.  Assessment/Plan: 1. T3N2M0, Stage IIIB/C rectal adenocarcinoma -We reviewed with the patient her biopsy and imaging results along with records [place] [facility]. We explained to her the significance of her tumor markers, CEA and CA 19-9, and their low levels as favorable.  -CT chest and abdomen MRI scan results, which showed no evidence of distant metastasis. Liver lesions are consistent with hemangioma.  -she has stage IIIc disease. Given the locally advanced tumor, we recommended undergoing neoadjuvant therapy with either 5-FU or capecitabine followed by surgical management and adjuvant chemotherapy.  -She began concurrent chemoradiation with capecitabine on 02/20/2015 2. Microcytic anemia, secondary to GI blood loss and iron deficiency anemia - Likely in the setting of ongoing blood loss from her malignancy. Recommended she take over-the-counter ferrous sulfate 1-2 tablets a day.  -We discussed if she is not able to tolerate oral iron, we'll consider IV Feraheme. -Hemoglobin lower 02/27/2015. Recommended that she begin oral iron. -Hemoglobin improved/stable 03/27/2015.   Disposition: Ms. HLosekeappears stable. She continues radiation/Xeloda. She will complete the course of radiation on  03/30/2015. She understands to discontinue Xeloda coinciding with the completion of radiation. She will return for a follow-up visit with Dr. Burr Medico on 04/18/2015.    Ned Card ANP/GNP-BC   03/27/2015  10:38 AM

## 2015-03-28 ENCOUNTER — Ambulatory Visit
Admission: RE | Admit: 2015-03-28 | Discharge: 2015-03-28 | Disposition: A | Discharge status: Home / Self Care | Payer: BLUE CROSS/BLUE SHIELD | Source: Ambulatory Visit | Attending: Radiation Oncology | Admitting: Radiation Oncology

## 2015-03-28 VITALS — BP 137/69 | HR 60 | Temp 98.8°F | Wt 337.9 lb

## 2015-03-28 DIAGNOSIS — Z510 Encounter for antineoplastic radiation therapy: Secondary | ICD-10-CM | POA: Diagnosis not present

## 2015-03-28 DIAGNOSIS — C20 Malignant neoplasm of rectum: Secondary | ICD-10-CM

## 2015-03-28 NOTE — Progress Notes (Signed)
Department of Radiation Oncology  Phone:  [phone] Fax:        [phone]  Weekly Treatment Note    Name: Lindsay Little Date: 03/28/2015 MRN: [id number] DOB: Sep 09, 1965   Current dose: 46.8 Gy  Current fraction:26   MEDICATIONS: Current Outpatient Prescriptions  Medication Sig Dispense Refill  . [place]ona alk-PHENObarbital (DONNATAL) 16.2 MG tablet Take 1 tablet by mouth daily.     . capecitabine (XELODA) 500 MG tablet Take 5 tab in the morning and 4 tab in the evening, after meals, on the day of radiation (off on weekend). 72 tablet 0  . Coenzyme Q10 (COQ-10) 100 MG CAPS Take 100 mg by mouth daily.    . diphenoxylate-atropine (LOMOTIL) 2.5-0.025 MG per tablet Take 1 tablet by mouth 4 (four) times daily as needed for diarrhea or loose stools. 30 tablet 2  . DULoxetine (CYMBALTA) 60 MG capsule Take 60 mg by mouth daily.    Marland Kitchen esomeprazole (NEXIUM) 40 MG capsule Take 40 mg by mouth 2 (two) times daily.    . ferrous sulfate 325 (65 FE) MG tablet Take 325 mg by mouth 2 (two) times daily with a meal.    . furosemide (LASIX) 20 MG tablet Take 20 mg by mouth as needed for fluid or edema.     Marland Kitchen HYDROcodone-acetaminophen (NORCO/VICODIN) 5-325 MG per tablet Take 2 tablets by mouth every 4 (four) hours as needed for moderate pain (every 4 to 6 hours).    . hydrocortisone 2.5 % cream Apply 1 application topically as needed.    . hyoscyamine (LEVBID) 0.375 MG 12 hr tablet Take 0.375 mg by mouth 2 (two) times daily.    Marland Kitchen loperamide (IMODIUM) 2 MG capsule Take 2 mg by mouth daily as needed for diarrhea or loose stools.     . meloxicam (MOBIC) 15 MG tablet Take 15 mg by mouth daily.    . methocarbamol (ROBAXIN) 500 MG tablet Take 500 mg by mouth as needed for muscle spasms.    . nebivolol (BYSTOLIC) 5 MG tablet Take 5 mg by mouth daily.    . ondansetron (ZOFRAN) 8 MG tablet Take 1 tablet (8 mg total) by mouth every 8 (eight) hours as needed for nausea. 20 tablet 3  . prochlorperazine  (COMPAZINE) 10 MG tablet Take 1 tablet (10 mg total) by mouth every 6 (six) hours as needed. 30 tablet 3  . rosuvastatin (CRESTOR) 5 MG tablet Take 5 mg by mouth daily.    . Vitamin D, Ergocalciferol, (DRISDOL) 50000 UNITS CAPS capsule Take 50,000 Units by mouth 2 (two) times a week.     No current facility-administered medications for this encounter.     ALLERGIES: Caine-1; Penicillins; and Sulfa antibiotics   LABORATORY DATA:  Lab Results  Component Value Date   WBC 5.4 03/27/2015   HGB 10.4* 03/27/2015   HCT 32.6* 03/27/2015   MCV 84.3 03/27/2015   PLT 218 03/27/2015   Lab Results  Component Value Date   NA 142 03/27/2015   K 4.1 03/27/2015   CL 102 03/08/2015   CO2 26 03/27/2015   Lab Results  Component Value Date   ALT 16 03/27/2015   AST 14 03/27/2015   ALKPHOS 64 03/27/2015   BILITOT 0.31 03/27/2015     NARRATIVE: Lindsay Little was seen today for weekly treatment management. The chart was checked and the patient's films were reviewed.  Weekly assessment of radiation to rectum.completed 26 of 28 fractions.Denies pain.No skin problems or rectal concerns.  Vitals fine.[facility] card to schedule one month follow up.awaiting insurance approval for surgery to be scheduled.Knows to call if any questions or concerns.Has sitz bath if needed.Has had only 1 episode of diarrhea in last 24 hours. BP 137/69 mmHg  Pulse 60  Temp(Src) 98.8 F (37.1 C)  Wt 337 lb 14.4 oz (153.27 kg)  SpO2 100%  Wt Readings from Last 3 Encounters:  03/28/15 337 lb 14.4 oz (153.27 kg)  03/27/15 337 lb 11.2 oz (153.18 kg)  03/24/15 341 lb (154.677 kg)    PHYSICAL EXAMINATION: weight is 337 lb 14.4 oz (153.27 kg). Her temperature is 98.8 F (37.1 C). Her blood pressure is 137/69 and her pulse is 60. Her oxygen saturation is 100%.        ASSESSMENT: The patient is doing satisfactorily with treatment.  PLAN: We will continue with the patient's radiation treatment as planned. Patient is doing  quite well, continues to tolerate radiation well after some difficulties earlier in her course of treatment. She will follow-up in our clinic in 1 month.

## 2015-03-28 NOTE — Progress Notes (Signed)
Weekly assessment of radiation to rectum.completed 26 of 28 fractions.Denies pain.No skin problems or rectal concerns. Vitals fine.[facility] card to schedule one month follow up.awaiting insurance approval for surgery to be scheduled.Knows to call if any questions or concerns.Has sitz bath if needed.Has had only 1 episode of diarrhea in last 24 hours. BP 137/69 mmHg  Pulse 60  Temp(Src) 98.8 F (37.1 C)  Wt 337 lb 14.4 oz (153.27 kg)  SpO2 100%  Wt Readings from Last 3 Encounters:  03/28/15 337 lb 14.4 oz (153.27 kg)  03/27/15 337 lb 11.2 oz (153.18 kg)  03/24/15 341 lb (154.677 kg)

## 2015-03-29 ENCOUNTER — Ambulatory Visit
Admission: RE | Admit: 2015-03-29 | Discharge: 2015-03-29 | Disposition: A | Discharge status: Home / Self Care | Payer: BLUE CROSS/BLUE SHIELD | Source: Ambulatory Visit | Attending: Radiation Oncology | Admitting: Radiation Oncology

## 2015-03-29 DIAGNOSIS — Z510 Encounter for antineoplastic radiation therapy: Secondary | ICD-10-CM | POA: Diagnosis not present

## 2015-03-30 ENCOUNTER — Ambulatory Visit
Admission: RE | Admit: 2015-03-30 | Discharge: 2015-03-30 | Disposition: A | Discharge status: Home / Self Care | Payer: BLUE CROSS/BLUE SHIELD | Source: Ambulatory Visit | Attending: Radiation Oncology | Admitting: Radiation Oncology

## 2015-03-30 ENCOUNTER — Encounter: Payer: Self-pay | Admitting: Radiation Oncology

## 2015-03-30 DIAGNOSIS — Z510 Encounter for antineoplastic radiation therapy: Secondary | ICD-10-CM | POA: Diagnosis not present

## 2015-03-31 ENCOUNTER — Encounter: Payer: Self-pay | Admitting: Hematology

## 2015-03-31 ENCOUNTER — Telehealth: Payer: Self-pay | Admitting: *Deleted

## 2015-03-31 NOTE — Telephone Encounter (Signed)
Lindsay Little upset and tearful regarding her disability forms are not completed yet-she is the only income because her husband is disabled. Made her aware that when I called Eddie Dibbles at [employer], he said she has until October 3rd to get the forms in. She still insists she was told she needs forms by today to get a check on October 1st. She got forms to office as soon as she could-03/27/15. Asking why more than one person is not doing forms? Suggested she contact [place] VP and share her concerns with him. Manager of managed care department is out of office until 9/21.

## 2015-03-31 NOTE — Progress Notes (Signed)
I called the patient back to advise I had her fmla forms and she said needs to be done today. I advised her 2 patients are in front of her and would fax them once done and make a copy for her. She wants done today and a call back from [place]. I told her they would not be done today and could not put her forms in front of the other 2 patients. She said there should be more than one person doing the forms when I told her it was only me doing the forms. She was not ugly, just wanted hers done today by someone and felt like we need more than one person doing fmla forms.

## 2015-04-03 ENCOUNTER — Encounter: Payer: Self-pay | Admitting: Radiation Oncology

## 2015-04-03 ENCOUNTER — Encounter: Payer: Self-pay | Admitting: Hematology

## 2015-04-03 NOTE — Progress Notes (Signed)
I emailed/scanned forms to Lindsay Little. This patient had radiation. See prev notes. I will let Lindsay Little know to call her when faxed and done.

## 2015-04-03 NOTE — Progress Notes (Signed)
Received email from [employer] with disability paperwork attached, forwarded disability paperwork to RN for processing

## 2015-04-05 ENCOUNTER — Ambulatory Visit: Payer: BLUE CROSS/BLUE SHIELD | Admitting: Cardiology

## 2015-04-18 ENCOUNTER — Other Ambulatory Visit (HOSPITAL_BASED_OUTPATIENT_CLINIC_OR_DEPARTMENT_OTHER): Payer: BLUE CROSS/BLUE SHIELD

## 2015-04-18 ENCOUNTER — Encounter: Payer: Self-pay | Admitting: Hematology

## 2015-04-18 ENCOUNTER — Ambulatory Visit (HOSPITAL_BASED_OUTPATIENT_CLINIC_OR_DEPARTMENT_OTHER): Payer: BLUE CROSS/BLUE SHIELD | Admitting: Hematology

## 2015-04-18 ENCOUNTER — Telehealth: Payer: Self-pay | Admitting: Hematology

## 2015-04-18 VITALS — BP 140/66 | HR 58 | Temp 97.8°F | Resp 18 | Ht 71.0 in | Wt 341.9 lb

## 2015-04-18 DIAGNOSIS — K922 Gastrointestinal hemorrhage, unspecified: Secondary | ICD-10-CM

## 2015-04-18 DIAGNOSIS — C20 Malignant neoplasm of rectum: Secondary | ICD-10-CM

## 2015-04-18 DIAGNOSIS — D500 Iron deficiency anemia secondary to blood loss (chronic): Secondary | ICD-10-CM | POA: Diagnosis not present

## 2015-04-18 LAB — COMPREHENSIVE METABOLIC PANEL (CC13)
ALT: 13 U/L (ref 0–55)
AST: 10 U/L (ref 5–34)
Albumin: 3.7 g/dL (ref 3.5–5.0)
Alkaline Phosphatase: 72 U/L (ref 40–150)
Anion Gap: 8 mEq/L (ref 3–11)
BUN: 13.7 mg/dL (ref 7.0–26.0)
CHLORIDE: 109 meq/L (ref 98–109)
CO2: 25 mEq/L (ref 22–29)
Calcium: 9.9 mg/dL (ref 8.4–10.4)
Creatinine: 0.7 mg/dL (ref 0.6–1.1)
GLUCOSE: 108 mg/dL (ref 70–140)
POTASSIUM: 4.4 meq/L (ref 3.5–5.1)
SODIUM: 142 meq/L (ref 136–145)
Total Bilirubin: 0.33 mg/dL (ref 0.20–1.20)
Total Protein: 7 g/dL (ref 6.4–8.3)

## 2015-04-18 LAB — CBC WITH DIFFERENTIAL/PLATELET
BASO%: 0.5 % (ref 0.0–2.0)
BASOS ABS: 0 10*3/uL (ref 0.0–0.1)
EOS%: 3.6 % (ref 0.0–7.0)
Eosinophils Absolute: 0.2 10*3/uL (ref 0.0–0.5)
HCT: 34 % — ABNORMAL LOW (ref 34.8–46.6)
HGB: 10.9 g/dL — ABNORMAL LOW (ref 11.6–15.9)
LYMPH%: 10.8 % — AB (ref 14.0–49.7)
MCH: 28 pg (ref 25.1–34.0)
MCHC: 32.1 g/dL (ref 31.5–36.0)
MCV: 87.4 fL (ref 79.5–101.0)
MONO#: 0.4 10*3/uL (ref 0.1–0.9)
MONO%: 7.7 % (ref 0.0–14.0)
NEUT#: 4.3 10*3/uL (ref 1.5–6.5)
NEUT%: 77.4 % — AB (ref 38.4–76.8)
Platelets: 227 10*3/uL (ref 145–400)
RBC: 3.89 10*6/uL (ref 3.70–5.45)
RDW: 19.9 % — ABNORMAL HIGH (ref 11.2–14.5)
WBC: 5.6 10*3/uL (ref 3.9–10.3)
lymph#: 0.6 10*3/uL — ABNORMAL LOW (ref 0.9–3.3)

## 2015-04-18 LAB — CEA: CEA: 0.9 ng/mL (ref 0.0–5.0)

## 2015-04-18 NOTE — Progress Notes (Signed)
[place]  [phone] Fax:([phone]) [phone]  Clinic Follow Up Note   Patient Care Team: [name], MD as PCP - General (Family Medicine) Michael Boston, MD as Consulting Physician (General Surgery) Truitt Merle, MD as Consulting Physician (Medical Oncology) Kyung Rudd, MD as Consulting Physician (Radiation Oncology) Teena Irani, MD as Consulting Physician (Gastroenterology) 04/18/2015  CHIEF COMPLAINTS/PURPOSE OF CONSULTATION:  Follow up rectal cancer   Oncology History   Rectal adenocarcinoma   Staging form: Colon and Rectum, AJCC 7th Edition     Clinical: T3, N2, M0 - Unsigned       Rectal adenocarcinoma ([place])   01/13/2015 Initial Diagnosis Rectal adenocarcinoma   01/13/2015 Procedure Colonoscopy showed a sensitivity O nonobstructing mass in the rectum and from 12-18 cm proximal to the Annis. The mass was circumferential, measuring about 6 cm in length. EGD was negative.   01/17/2015 Tumor Marker CEA 1.4, CA-19-9 8    01/18/2015 Procedure Lower endoscopic ultrasound by Dr. Paulita Fujita showed a T3 N2 rectal mass.   01/20/2015 Imaging CT abdomen and pelvis with contrast showed right lateral rectal wall exophytic mass, and 2 ill-defined hepatic hypoenhancing lesions which appears to correspond to the lesion seen on the prior MRI in 2015.   02/05/2015 Imaging abdomen MRI showed 2 hemangioma, no suspicion for metastatic disease. CT chest was negative   02/20/2015 - 03/30/2015 Radiation Therapy neoadjuvant RT to rectal cancer    02/20/2015 Concurrent Chemotherapy capecitabine 2500 mg in the morning and 2000 mg in the evening (869m/m2, bid), on the day of radiation.     HISTORY OF PRESENTING ILLNESS:  Lindsay STEPKA49y.o. female is here because of evaluation for management for newly diagnosed rectal adenocarcinoma.   On 09/06/13, she presented to Dr. HStarr SinclairGI] with 41-monthistory of diarrhea and worsening abdominal pain. She has a history of chronic abdominal pain  and had been previously [place] by Dr. HaAmedeo PlentyAbdominal [place]/15 noted to have some hypoechoicity in the left hepatic lobe which was confirmed on MRI to be two hemangiomas  in the right hepatic lobe of measuring up to 3.0 cm along with an additional 7 mm probable cyst.   She presented with worsening of diarrhea for 6 month and was reevaluated by Dr. HaAmedeo PlentyShe was seen on 12/06/14 at which time it was decided she would undergo HIDA scan given that the pain was intermittently in the RUQ in addition to upper and lower endoscopy. HIDA scan 12/15/14 was reassuring, but she was found to have a malignant tumor of the rectum about 12-18 cm proximal to the anus, gastritis, gastric polyps on endoscopies 01/13/15. Biopsies for these three specimens along with the duodenum were collected and resulted on 01/17/15 for invasive adenocarcinoma, chronic gastritis [H. Pylori negative], fundic gland polyps, and non-specific mild patchy intra-epithelial lymphocytes. Endoscopic [place]/6/16 staged the mass at T3N2Mx and she is here today for consideration of neoadjuvant therapy.    Today, she does report abdominal pain but is no different than her baseline [right-sided, upper, lower] but has had worsening appetite over the last 2 months. She does have diarrhea with bowel movements every 1-2 hours, especially after eating, and has been noticing bleeding with her bowel movement following her endoscopic evaluation. She also feels her energy level has decreased. Otherwise, she denies nausea, chest pain, shortness of breath.    Family history is notable for a maternal  uncle who had pancreatic cancer diagnosed in his mid to late 54s that spread to the colon and prostate before he died about 10 years later and a sister with non-melanoma skin cancer. 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 She  smoked roughly 20 pack years [1 pack/week x 7 years] in the past and reports occasional alcohol use but denies any prior illicit drug use.  She lost about 7 lbs in the past 2 months  CURRENT THERAPY: pending surgery    INTERIM HISTORY Lindsay Little returns for follow-up. She completed concurrent chemoradiation on 03/30/2015. She has recovered very well from the treatment. She has formed, soft, bowel movement 2-3 times a day, no melena or hematochezia. She has good appetite and eating well. No nausea, abdominal discomfort or other complaints. Her husband underwent gastric bypass surgery a week ago, and she has been busy taking care of him.   HISTORY:  Past Medical History  Diagnosis Date  . Hypertension   . Hypercholesteremia   . Obesity   . Tear of left rotator cuff   . IBS (irritable bowel syndrome)   . Vertigo   . TMJ (dislocation of temporomandibular joint)   . Bulging disc     L5  . Mild sleep apnea   . Depression   . Rectal bleeding   . Vitamin D deficiency   . Osteoarthritis, knee     SURGICAL HISTORY: Past Surgical History  Procedure Laterality Date  . Rotator cuff repair    . Eus N/A 01/18/2015    Procedure: LOWER ENDOSCOPIC ULTRASOUND (EUS);  Surgeon: Arta Silence, MD;  Location: [facility];  Service: Endoscopy;  Laterality: N/A;  . Abdominal hysterectomy  1996    Removed uterus, and tubes    SOCIAL HISTORY: Social History   Social History  . Marital Status: Married    Spouse Name: N/A  . Number of Children: N/A  . Years of Education: N/A   Occupational History  . Not on file.   Social History Main Topics  . Smoking status: Former Research scientist (life sciences)  . Smokeless tobacco: Not on file     Comment: 1 pack/week intermittently x 7 years  . Alcohol Use: 0.6 oz/week    1 Shots of liquor per week  . Drug Use: Not on file  . Sexual Activity: Not on file   Other Topics Concern  . Not on file   Social History Narrative   Tobacco Use: Cigarettes - Former Smoker   Alcohol:  Yes, very rare, liquor.    No recreational drug use   Occupation: Head CMA @ [employer]   Marital Status: Married    Husband: Roselyn Reef Disabled   Children: 2 adopted kids Scissors   Religion: [employer] [place] HISTORY: Family History  Problem Relation Age of Onset  . Coronary artery disease Mother 4  . Hypertension Mother   . Hyperlipidemia Mother   . Diabetes Mellitus I Mother   . Coronary artery disease Father   . Hyperlipidemia Father   . Hypertension Father   . Cancer Sister     skin - non melanoma  . Hyperlipidemia Brother   . Cancer Maternal Uncle 68    pancreatic with mets to colon and prostate  . Cirrhosis Maternal Uncle   . Hypertension Maternal Grandmother   . Diabetes  Mellitus I Maternal Grandmother   . Hyperlipidemia Maternal Grandmother   . CVA Maternal Grandmother   . Hypertension Maternal Grandfather   . Coronary artery disease Maternal Grandfather 65  . Hyperlipidemia Maternal Grandfather   . Coronary artery disease Paternal Grandmother   . Hypertension Paternal Grandmother   . Hyperlipidemia Paternal Grandmother   . Diabetes Mellitus I Paternal Grandmother   . Hypertension Paternal Grandfather   . Hyperlipidemia Paternal Grandfather   . Coronary artery disease Paternal Grandfather     ALLERGIES:  is allergic to caine-1; penicillins; and sulfa antibiotics.  MEDICATIONS:  Current Outpatient Prescriptions  Medication Sig Dispense Refill  . belladona alk-PHENObarbital (DONNATAL) 16.2 MG tablet Take 1 tablet by mouth daily.     . Coenzyme Q10 (COQ-10) 100 MG CAPS Take 100 mg by mouth daily.    . diphenoxylate-atropine (LOMOTIL) 2.5-0.025 MG per tablet Take 1 tablet by mouth 4 (four) times daily as needed for diarrhea or loose stools. 30 tablet 2  . DULoxetine (CYMBALTA) 60 MG capsule Take 60 mg by mouth daily.    Marland Kitchen esomeprazole (NEXIUM) 40 MG capsule Take 40 mg by mouth 2 (two) times daily.    . ferrous  sulfate 325 (65 FE) MG tablet Take 325 mg by mouth 2 (two) times daily with a meal.    . furosemide (LASIX) 20 MG tablet Take 20 mg by mouth as needed for fluid or edema.     Marland Kitchen HYDROcodone-acetaminophen (NORCO/VICODIN) 5-325 MG per tablet Take 2 tablets by mouth every 4 (four) hours as needed for moderate pain (every 4 to 6 hours).    . hydrocortisone 2.5 % cream Apply 1 application topically as needed.    . hyoscyamine (LEVBID) 0.375 MG 12 hr tablet Take 0.375 mg by mouth 2 (two) times daily.    Marland Kitchen loperamide (IMODIUM) 2 MG capsule Take 2 mg by mouth daily as needed for diarrhea or loose stools.     . meloxicam (MOBIC) 15 MG tablet Take 15 mg by mouth daily.    . methocarbamol (ROBAXIN) 500 MG tablet Take 500 mg by mouth as needed for muscle spasms.    . nebivolol (BYSTOLIC) 5 MG tablet Take 5 mg by mouth daily.    . ondansetron (ZOFRAN) 8 MG tablet Take 1 tablet (8 mg total) by mouth every 8 (eight) hours as needed for nausea. 20 tablet 3  . prochlorperazine (COMPAZINE) 10 MG tablet Take 1 tablet (10 mg total) by mouth every 6 (six) hours as needed. 30 tablet 3  . rosuvastatin (CRESTOR) 5 MG tablet Take 5 mg by mouth daily.    . Vitamin D, Ergocalciferol, (DRISDOL) 50000 UNITS CAPS capsule Take 50,000 Units by mouth 2 (two) times a week.    . capecitabine (XELODA) 500 MG tablet Take 5 tab in the morning and 4 tab in the evening, after meals, on the day of radiation (off on weekend). (Patient not taking: Reported on 04/18/2015) 72 tablet 0   No current facility-administered medications for this visit.    REVIEW OF SYSTEMS:   Constitutional: Denies fevers, chills or abnormal night sweats Eyes: Denies blurriness of vision, double vision or watery eyes Ears, nose, mouth, throat, and face: Denies mucositis or sore throat Respiratory: Denies cough, dyspnea or wheezes Cardiovascular: Denies palpitation, chest discomfort or lower extremity swelling Gastrointestinal:  Diarrhea, abdominal pain,  hematochezia Skin: Denies abnormal skin rashes Lymphatics: Denies new lymphadenopathy or easy bruising Neurological:Denies numbness, tingling or new weaknesses Behavioral/Psych: Mood is stable, no new changes  All other systems were reviewed with the patient and are negative.  PHYSICAL EXAMINATION: ECOG PERFORMANCE STATUS: 1 - Symptomatic but completely ambulatory  Filed Vitals:   04/18/15 1037  BP: 140/66  Pulse: 58  Temp: 97.8 F (36.6 C)  Resp: 18   Filed Weights   04/18/15 1037  Weight: 341 lb 14.4 oz (155.085 kg)    GENERAL:alert, no distress and comfortable SKIN: skin color, texture, turgor are normal, no rashes or significant lesions EYES: normal, conjunctiva are pink and non-injected, sclera clear OROPHARYNX:no exudate, no erythema and lips, buccal mucosa, and tongue normal  NECK: supple, thyroid normal size, non-tender, without nodularity LYMPH:  no palpable lymphadenopathy in the cervical, axillary or inguinal LUNGS: clear to auscultation and percussion with normal breathing effort HEART: regular rate & rhythm and no murmurs and no lower extremity edema ABDOMEN:abdomen soft, normal bowel sounds. No organomegaly Musculoskeletal:no cyanosis of digits and no clubbing  PSYCH: alert & oriented x 3 with fluent speech NEURO: no focal motor/sensory deficits   LABORATORY DATA:  I have reviewed the data as listed CBC Latest Ref Rng 04/18/2015 03/27/2015 03/21/2015  WBC 3.9 - 10.3 10e3/uL 5.6 5.4 5.0  Hemoglobin 11.6 - 15.9 g/dL 10.9(L) 10.4(L) 10.6(L)  Hematocrit 34.8 - 46.6 % 34.0(L) 32.6(L) 32.7(L)  Platelets 145 - 400 10e3/uL 227 218 234    CMP Latest Ref Rng 04/18/2015 03/27/2015 03/21/2015  Glucose 70 - 140 mg/dl 108 110 101  BUN 7.0 - 26.0 mg/dL 13.7 12.4 11.7  Creatinine 0.6 - 1.1 mg/dL 0.7 0.7 0.7  Sodium 136 - 145 mEq/L 142 142 141  Potassium 3.5 - 5.1 mEq/L 4.4 4.1 4.3  Chloride 101 - 111 mmol/L - - -  CO2 22 - 29 mEq/L 25 26 28   Calcium 8.4 - 10.4 mg/dL 9.9 9.6  9.9  Total Protein 6.4 - 8.3 g/dL 7.0 6.6 6.8  Total Bilirubin 0.20 - 1.20 mg/dL 0.33 0.31 0.41  Alkaline Phos 40 - 150 U/L 72 64 62  AST 5 - 34 U/L 10 14 11   ALT 0 - 55 U/L 13 16 16      Diagnosis 01/13/2015 1. Duodenum, Biopsy - MILD PATCHY INCREASE IN INTRAEPITHELIAL LYMPHOCYTES, SEE COMMENT. - NO DYSPLASIA OR MALIGNANCY. 2. Stomach, polyp(s), gastric - FUNDIC GLAND POLYPS. - NO INTESTINAL METAPLASIA, DYSPLASIA, OR MALIGNANCY. 3. Stomach, biopsy, gastric antrum - CHRONIC GASTRITIS. - NEGATIVE FOR HELICOBACTER PYLORI. - NO INTESTINAL METAPLASIA, DYSPLASIA, OR MALIGNANCY. 4. Rectum, biopsy, rectal mass - INVASIVE ADENOCARCINOMA. Microscopic Comment 1. There is a patchy mild increase in intraepithelial lymphocytes, including at the tips of villi. However, there is no significant villous blunting. This pattern is non-specific and can be seen in celiac disease, NSAID injury, H. pylori infection, etc. 3. A Warthin-Starry stain is performed to determine the possibility of the presence of Helicobacter pylori. The Warthin-Starry stain is negative for organisms of Helicobacter pylori. 4. Dr. Saralyn Pilar has reviewed the case. The case was called to Dr. Amedeo Plenty' office on 01/17/2015.   RADIOGRAPHIC STUDIES: I have personally reviewed the radiological images as listed and agreed with the findings in the report.  Ct Abdomen Pelvis W Contrast 01/20/2015    IMPRESSION: Right lateral rectal wall exophytic mass likely corresponding to the known neoplasm.  Two ill-defined hepatic hypo enhancing lesions which appears to correspond to the lesions seen on the prior MRI. Please note evaluation of the liver is very limited due to streak artifact. If there is clinical concern for new metastatic disease MRI may provide better evaluation.  Small left ovarian cystic lesion corresponding to the lesion reported on the prior pelvic ultrasound. Ultrasound may provide better evaluation of the pelvic structures.    Electronically Signed   By: Anner Crete M.D.   On: 01/20/2015 16:15   Lower endoscopic ultrasound by Dr. Paulita Fujita 01/18/2015 FINDINGS: Normal digital rectal exam; could not palpate mass. In proximal rectum extending to the distal rectum, from 12 cm to 20 cm from the anal verge, a partial circumferential (50-75% circumferential distally, 75-100% circumferential proximall) mass was seen. Mass was firm, fixed, ulcerated, friable. EUS radial scope was able to traverse the tumor into normal-appearing distal sigmoid colon. Lesion invaded through the wall of the rectum into neighboring connective tissues in multiple regions. There were several round hypoechoic well-defined malignant-appearing lymph nodes in the vicinity of the proximal portion of the tumor. STAGING: T3 N2 Mx via rectal ultrasound ENDOSCOPIC IMPRESSION: As above. Rectosigmoid mass, biopsy-proven adenocarcinoma, with local invasion noted under ultrasound. RECOMMENDATIONS: 1. Watch for potential complications of procedure. 2. Needs CT chest, abdomen, pelvis to complete staging. 3. Based already on rectal ultrasound results, patient would not be candidate for upfront surgery. She will need surgical and radiation oncology consultations. 4. Will discuss with Dr. Amedeo Plenty.  CT chest 02/07/2015 IMPRESSION: 1. No findings of metastatic disease to the chest.  Abdomen MRI w and wo contrast 02/05/2015  IMPRESSION: 1. Small cavernous hemangiomas in segments 5 and 7 of the liver redemonstrated, as above. Additional 7 mm indeterminate lesion is also favored to represent a tiny cavernous hemangioma, but may alternatively represent a small cyst. No new suspicious appearing hepatic lesions to suggest presence of metastatic disease at this time. 2. Mild hepatic steatosis.   ASSESSMENT & PLAN: Lindsay Little is a 49 year old female with chronic abdominal pain with rectal mass on colonoscopy found to be invasive rectal adenocarcinoma.   1. T3N2M0,  Stage IIIC rectal adenocarcinoma -I reviewed her biopsy and imaging results along with records sent to [place] from her GI doctor. We explained to her the significance of her tumor markers, CEA and CA 19-9, and their low levels as favorable.  -I reviewed her CT chest and abdomen MRI scan results, which showed no evidence of distant metastasis. Liver lesions are consistent with hemangioma.  -she has stage IIIc disease. Given the locally advanced tumor, we recommended undergoing neoadjuvant therapy with either 5-FU or capecitabine followed by surgical management and adjuvant chemotherapy. She opted capecitabine  -She completed neoadjuvant concurrent chemoradiation, tolerated well, and had a good response, her diarrhea and hematochezia has resolved. -She is scheduled to have surgery on 05/25/2015 -We discussed adjuvant chemotherapy, she opted FOLFOX. I'll ask Dr. Johney Maine to put a port in during her surgery -I'll see her back 3 weeks after her surgery   2. Microcytic anemia, secondary to GI blood loss and iron deficiency anemia - Likely in the setting of ongoing blood loss from her malignancy.  -Her initial lab showed a ferritin of 28, serum iron 40, saturation 12%. Consistent with mild iron deficiency. She is taking ferrous sulfate once daily -We discussed if she is not able to tolerate oral iron, we'll consider IV Feraheme, she would like to try oral pill first. -Her Hb today is 10.9, improved  -will continue monitoring   3. HTN, depression -She'll continue follow-up with her primary care physician   Plan: -She is scheduled before rectal cancer surgery on 05/25/2059 -I'll see her back 3 weeks after her surgery  -She wants to go back to work, which  is fine with me. -She got flu shot a few weeks ago   I spent 20 mins for the visit, >50% face to face discussion.   Truitt Merle  04/18/2015

## 2015-04-18 NOTE — Telephone Encounter (Signed)
Pt confirmed labs/ov per 10/04 POF, gave pt AVS and Calendar.... KJ

## 2015-04-20 NOTE — Progress Notes (Signed)
  Radiation Oncology         [phone] ________________________________  Name: VAISHNAVI DALBY MRN: [id number]  Date: 03/30/2015  DOB: 07-25-1965  End of Treatment Note  Diagnosis:   Rectal cancer    Rectal adenocarcinoma ([place])   Staging form: Colon and Rectum, AJCC 7th Edition     Clinical: T3, N2, M0 - Unsigned   Indication for treatment:  Curative       Radiation treatment dates:   02/20/2015 through 03/30/2015  Site/dose:    The patient was treated to the pelvis to a dose of 45 Gy at 1.8 Gy per fraction. This was accomplished using a 4 field 3-D conformal technique. The patient then received a boost to the tumor and adjacent high-risk regions for an additional 9 Gy at 1.8 gray per fraction. This was carried out using a coned-down 4 field approach. The patient's total dose was 54 Gy. Daily AlignRT was used on a daily basis For the first 8 fractionsto insure proper patient positioning and localization of critical targets/ structures. After this, pretreatment port films were taken on a daily basis. The patient received concurrent chemotherapy during the course of radiation treatment.  Narrative: The patient tolerated radiation treatment relatively well.   She did have an episode where she had increased abdominal pain towards the beginning of the treatment. With hydration this markedly improved and she did quite well subsequently.  Plan: The patient has completed radiation treatment. The patient will return to radiation oncology clinic for routine followup in one month. I advised the patient to call or return sooner if they have any questions or concerns related to their recovery or treatment.   ------------------------------------------------  Jodelle Gross, MD, PhD

## 2015-05-01 ENCOUNTER — Encounter: Payer: Self-pay | Admitting: Hematology

## 2015-05-03 ENCOUNTER — Telehealth: Payer: Self-pay | Admitting: *Deleted

## 2015-05-03 NOTE — Telephone Encounter (Signed)
Dr. Burr Medico reviewed non- urgent medical question from [place].  Spoke with pt and informed her re:  As long as no bleeding, pt can use Imodium for diarrhea.  Pt has surgery scheduled for 11/10.  Pt understood to call office back if she notices any bleeding.

## 2015-05-09 ENCOUNTER — Encounter: Payer: Self-pay | Admitting: Radiation Oncology

## 2015-05-11 ENCOUNTER — Ambulatory Visit
Admission: RE | Admit: 2015-05-11 | Payer: BLUE CROSS/BLUE SHIELD | Source: Ambulatory Visit | Admitting: Radiation Oncology

## 2015-05-11 HISTORY — DX: Personal history of irradiation: Z92.3

## 2015-05-12 ENCOUNTER — Ambulatory Visit
Admission: RE | Admit: 2015-05-12 | Discharge: 2015-05-12 | Disposition: A | Discharge status: Home / Self Care | Payer: BLUE CROSS/BLUE SHIELD | Source: Ambulatory Visit | Attending: Radiation Oncology | Admitting: Radiation Oncology

## 2015-05-12 ENCOUNTER — Inpatient Hospital Stay
Admission: RE | Admit: 2015-05-12 | Discharge: 2015-05-12 | Disposition: A | Discharge status: Home / Self Care | Payer: BLUE CROSS/BLUE SHIELD | Source: Ambulatory Visit | Attending: Radiation Oncology | Admitting: Radiation Oncology

## 2015-05-12 ENCOUNTER — Encounter: Payer: Self-pay | Admitting: Radiation Oncology

## 2015-05-12 DIAGNOSIS — C20 Malignant neoplasm of rectum: Secondary | ICD-10-CM

## 2015-05-12 NOTE — Progress Notes (Signed)
Radiation Oncology         [phone] ________________________________  Name: Lindsay Little MRN: [id number]  Date: 05/12/2015  DOB: 11/25/1965  Follow-Up Visit Note  CC: Vidal Schwalbe, MD  [name], MD  Diagnosis: rectal cancer Rectal adenocarcinoma ([place])   Staging form: Colon and Rectum, AJCC 7th Edition     Clinical: T3, N2, M0 - Unsigned   Oncology History   Rectal adenocarcinoma   Staging form: Colon and Rectum, AJCC 7th Edition     Clinical: T3, N2, M0 - Unsigned       Rectal adenocarcinoma ([place])   01/13/2015 Initial Diagnosis Rectal adenocarcinoma   01/13/2015 Procedure Colonoscopy showed a sensitivity O nonobstructing mass in the rectum and from 12-18 cm proximal to the [place]. The mass was circumferential, measuring about 6 cm in length. EGD was negative.   01/17/2015 Tumor Marker CEA 1.4, CA-19-9 8    01/18/2015 Procedure Lower endoscopic ultrasound by Dr. Paulita Fujita showed a T3 N2 rectal mass.   01/20/2015 Imaging CT abdomen and pelvis with contrast showed right lateral rectal wall exophytic mass, and 2 ill-defined hepatic hypoenhancing lesions which appears to correspond to the lesion seen on the prior MRI in 2015.   02/05/2015 Imaging abdomen MRI showed 2 hemangioma, no suspicion for metastatic disease. CT chest was negative   02/20/2015 - 03/30/2015 Radiation Therapy neoadjuvant RT to rectal cancer    02/20/2015 Concurrent Chemotherapy capecitabine 2500 mg in the morning and 2000 mg in the evening (828m/m2, bid), on the day of radiation.      Narrative:  The patient returns today for routine follow-up.    Follow up s/p rad to rectum 02/20/15-03/30/15, still having 5-6 loose stools, but eating lots of salads fresh vegetables again, no bladder issues, no pain no blood, in stools, appetite good, Surgery scheduled 05/25/15 with Dr. GJohney [place] follow up with Dr. FBurr Medico11/30/16 to start chemotherapy for 4 months 4:17 PM There were no vitals taken for this visit.  Wt Readings from  Last 3 Encounters:  04/18/15 341 lb 14.4 oz (155.085 kg)  03/28/15 337 lb 14.4 oz (153.27 kg)  03/27/15 337 lb 11.2 oz (153.18 kg)                       ALLERGIES:  is allergic to caine-1; penicillins; and sulfa antibiotics.  Meds: Current Outpatient Prescriptions  Medication Sig Dispense Refill  . bell[place]ona alk-PHENObarbital (DONNATAL) 16.2 MG tablet Take 1 tablet by mouth every 8 (eight) hours as needed (abdominal cramps).     . Coenzyme Q10 (COQ-10) 100 MG CAPS Take 100 mg by mouth daily.    . DULoxetine (CYMBALTA) 60 MG capsule Take 60 mg by mouth daily.    .Marland Kitchenesomeprazole (NEXIUM) 40 MG capsule Take 40 mg by mouth 2 (two) times daily.    . furosemide (LASIX) 20 MG tablet Take 20 mg by mouth as needed for fluid or edema.     . hyoscyamine (LEVBID) 0.375 MG 12 hr tablet Take 0.375 mg by mouth 2 (two) times daily.    . meloxicam (MOBIC) 15 MG tablet Take 15 mg by mouth daily.    . nebivolol (BYSTOLIC) 10 MG tablet Take 10 mg by mouth daily.    . rosuvastatin (CRESTOR) 5 MG tablet Take 5 mg by mouth daily.    . Vitamin D, Ergocalciferol, (DRISDOL) 50000 UNITS CAPS capsule Take 50,000 Units by mouth 2 (two) times a week.    . diphenoxylate-atropine (LOMOTIL) 2.5-0.025 MG  per tablet Take 1 tablet by mouth 4 (four) times daily as needed for diarrhea or loose stools. (Patient not taking: Reported on 05/12/2015) 30 tablet 2  . HYDROcodone-acetaminophen (NORCO/VICODIN) 5-325 MG per tablet Take 2 tablets by mouth every 4 (four) hours as needed for moderate pain (every 4 to 6 hours).    . hydrocortisone 2.5 % cream Apply 1 application topically as needed (rash).     Marland Kitchen loperamide (IMODIUM) 2 MG capsule Take 2 mg by mouth daily as needed for diarrhea or loose stools.     . metroNIDAZOLE (FLAGYL) 500 MG tablet Take 500 mg by mouth once. 1 tab @@ 2pm, 3pm and 10 pm day before surgery    . neomycin (MYCIFRADIN) 500 MG tablet Take 500 mg by mouth once. 2 tabs 2pm, 3pm and 10 pm day before surgery    .  ondansetron (ZOFRAN) 8 MG tablet Take 1 tablet (8 mg total) by mouth every 8 (eight) hours as needed for nausea. (Patient not taking: Reported on 05/05/2015) 20 tablet 3  . prochlorperazine (COMPAZINE) 10 MG tablet Take 1 tablet (10 mg total) by mouth every 6 (six) hours as needed. (Patient not taking: Reported on 05/05/2015) 30 tablet 3  . traMADol (ULTRAM) 50 MG tablet Take 50 mg by mouth every 6 (six) hours as needed for moderate pain.     No current facility-administered medications for this encounter.    Physical Findings: The patient is in no acute distress. Patient is alert and oriented.  vitals were not taken for this visit..      Lab Findings: Lab Results  Component Value Date   WBC 5.6 04/18/2015   HGB 10.9* 04/18/2015   HCT 34.0* 04/18/2015   MCV 87.4 04/18/2015   PLT 227 04/18/2015     Radiographic Findings: No results found.  Impression:    The patient is doing satisfactorily approximately one month after completing preoperative chemoradiotherapy for rectal cancer. Surgery is scheduled on 05/25/2015.   We discussed using a vaginal dilator to combat the effects of vaginal narrowing/fibrosis. She was given both a small and medium vaginal dilator.   Plan:  The patient will followup in 6 months.    Jodelle Gross, MD, PhD

## 2015-05-12 NOTE — Progress Notes (Signed)
follo

## 2015-05-12 NOTE — Progress Notes (Signed)
Follow up s/p rad to rectum 02/20/15-03/30/15, still having 5-6 loose stools, but eating lots of salads fresh vegetables again, no bladder issues, no pain no blood, in stools, appetite good, Surgery scheduled 05/25/15 with Dr. Johney [place], follow up with Dr. Burr Medico 06/14/15 to start chemotherapy for 4 months 1:12 PM There were no vitals taken for this visit.  Wt Readings from Last 3 Encounters:  04/18/15 341 lb 14.4 oz (155.085 kg)  03/28/15 337 lb 14.4 oz (153.27 kg)  03/27/15 337 lb 11.2 oz (153.18 kg)

## 2015-05-18 ENCOUNTER — Encounter (HOSPITAL_COMMUNITY)
Admission: RE | Admit: 2015-05-18 | Discharge: 2015-05-18 | Disposition: A | Discharge status: Home / Self Care | Payer: BLUE CROSS/BLUE SHIELD | Source: Ambulatory Visit | Attending: Surgery | Admitting: Surgery

## 2015-05-18 ENCOUNTER — Encounter (HOSPITAL_COMMUNITY): Payer: Self-pay

## 2015-05-18 DIAGNOSIS — Z01818 Encounter for other preprocedural examination: Secondary | ICD-10-CM | POA: Diagnosis present

## 2015-05-18 DIAGNOSIS — C20 Malignant neoplasm of rectum: Secondary | ICD-10-CM | POA: Diagnosis not present

## 2015-05-18 HISTORY — DX: Adverse effect of unspecified anesthetic, initial encounter: T41.45XA

## 2015-05-18 HISTORY — DX: Nausea with vomiting, unspecified: R11.2

## 2015-05-18 HISTORY — DX: Personal history of antineoplastic chemotherapy: Z92.21

## 2015-05-18 HISTORY — DX: Gastro-esophageal reflux disease without esophagitis: K21.9

## 2015-05-18 HISTORY — DX: Other complications of anesthesia, initial encounter: T88.59XA

## 2015-05-18 HISTORY — DX: Other specified postprocedural states: Z98.890

## 2015-05-18 LAB — BASIC METABOLIC PANEL
Anion gap: 6 (ref 5–15)
BUN: 14 mg/dL (ref 6–20)
CHLORIDE: 107 mmol/L (ref 101–111)
CO2: 27 mmol/L (ref 22–32)
CREATININE: 0.68 mg/dL (ref 0.44–1.00)
Calcium: 9.7 mg/dL (ref 8.9–10.3)
GFR calc non Af Amer: >60 mL/min (ref >60)
Glucose, Bld: 144 mg/dL — ABNORMAL HIGH (ref 65–99)
Potassium: 4 mmol/L (ref 3.5–5.1)
Sodium: 140 mmol/L (ref 135–145)

## 2015-05-18 LAB — CBC
HEMATOCRIT: 35.1 % — AB (ref 36.0–46.0)
HEMOGLOBIN: 10.9 g/dL — AB (ref 12.0–15.0)
MCH: 27 pg (ref 26.0–34.0)
MCHC: 31.1 g/dL (ref 30.0–36.0)
MCV: 87.1 fL (ref 78.0–100.0)
Platelets: 282 10*3/uL (ref 150–400)
RBC: 4.03 MIL/uL (ref 3.87–5.11)
RDW: 17.8 % — ABNORMAL HIGH (ref 11.5–15.5)
WBC: 6.5 10*3/uL (ref 4.0–10.5)

## 2015-05-18 LAB — ABO/RH: ABO/RH(D): O POS

## 2015-05-18 NOTE — Consult Note (Signed)
WOC requested for preoperative stoma site marking  Discussed surgical procedure and stoma creation with patient.  Explained role of the [place] nurse team.  Provided the patient with educational booklet/DVD and provided samples of pouching options.  Answered patient questions.   Examined patient sitting, and standing in order to place the marking in the patient's visual field, away from any creases or abdominal contour issues and within the rectus muscle.  Attempted to mark below the patient's belt line.  Patient with large pannus, marked below belt line, patient wears pants high.  Marked for colostomy in the LLQ  8 cm to the left of the umbilicus and 2 cm above/below the umbilicus.  Marked for ileostomy in the RLQ  7 cm to the right of the umbilicus and  2 cm above/below the umbilicus.  Cleaned right and left abdominal quadrants with CHG wipes prior to marking the abdomen.  Covered mark with thin film transparent dressing to preserve mark until date of surgery  WOC team will follow up with patient after surgery for continue ostomy care and teaching.  Melton Walls [place] RN,CWOCN [phone]

## 2015-05-18 NOTE — Patient Instructions (Signed)
Lindsay Little  05/18/2015   Your procedure is scheduled on: Thursday May 25, 2015  Report to [facility]  Entrance take [place]  elevators to 3rd floor to  [place] at 5:15 AM.  Call this number if you have problems the morning of surgery [phone]   Remember: ONLY 1 PERSON MAY GO WITH YOU TO SHORT STAY TO GET  READY MORNING OF [place].  Do not eat food or drink liquids :After Midnight.               FOLLOW MD INSTRUCTION IN REGARDS TO BOWEL PREPARATION PRIOR TO SURGERY    Take these medicines the morning of surgery with A SIP OF WATER:Hydrocodone-Acetaminophen if needed                               You may not have any metal on your body including hair pins and              piercings  Do not wear jewelry, make-up, lotions, powders or perfumes, deodorant             Do not wear nail polish.  Do not shave  48 hours prior to surgery.             Do not bring valuables to the hospital. [place].  Contacts, dentures or bridgework may not be worn into surgery.  Leave suitcase in the car. After surgery it may be brought to your room.                Please read over the following fact sheets you were given: BLOOD TRANSFUSION INFORMATION SHEET _____________________________________________________________________             [facility] - Preparing for Surgery Before surgery, you can play an important role.  Because skin is not sterile, your skin needs to be as free of germs as possible.  You can reduce the number of germs on your skin by washing with CHG (chlorahexidine gluconate) soap before surgery.  CHG is an antiseptic cleaner which kills germs and bonds with the skin to continue killing germs even after washing. Please DO NOT use if you have an allergy to CHG or antibacterial soaps.  If your skin becomes reddened/irritated stop using the CHG and inform your nurse when you arrive at  Short Stay. Do not shave (including legs and underarms) for at least 48 hours prior to the first CHG shower.  You may shave your face/neck. Please follow these instructions carefully:  1.  Shower with CHG Soap the night before surgery and the  morning of Surgery.  2.  If you choose to wash your hair, wash your hair first as usual with your  normal  shampoo.  3.  After you shampoo, rinse your hair and body thoroughly to remove the  shampoo.                           4.  Use CHG as you would any other liquid soap.  You can apply chg directly  to the skin and wash  Gently with a scrungie or clean washcloth.  5.  Apply the CHG Soap to your body ONLY FROM THE NECK DOWN.   Do not use on face/ open                           Wound or open sores. Avoid contact with eyes, ears mouth and genitals (private parts).                       Wash face,  Genitals (private parts) with your normal soap.             6.  Wash thoroughly, paying special attention to the area where your surgery  will be performed.  7.  Thoroughly rinse your body with warm water from the neck down.  8.  DO NOT shower/wash with your normal soap after using and rinsing off  the CHG Soap.                9.  Pat yourself dry with a clean towel.            10.  Wear clean pajamas.            11.  Place clean sheets on your bed the night of your first shower and do not  sleep with pets. Day of Surgery : Do not apply any lotions/deodorants the morning of surgery.  Please wear clean clothes to the hospital/surgery center.  FAILURE TO FOLLOW THESE INSTRUCTIONS MAY RESULT IN THE CANCELLATION OF YOUR SURGERY PATIENT SIGNATURE_________________________________  NURSE SIGNATURE__________________________________  ________________________________________________________________________  WHAT IS A BLOOD TRANSFUSION? Blood Transfusion Information  A transfusion is the replacement of blood or some of its parts. Blood is made up  of multiple cells which provide different functions.  Red blood cells carry oxygen and are used for blood loss replacement.  White blood cells fight against infection.  Platelets control bleeding.  Plasma helps clot blood.  Other blood products are available for specialized needs, such as hemophilia or other clotting disorders. BEFORE THE TRANSFUSION  Who gives blood for transfusions?   Healthy volunteers who are fully evaluated to make sure their blood is safe. This is blood bank blood. Transfusion therapy is the safest it has ever been in the practice of medicine. Before blood is taken from a donor, a complete history is taken to make sure that person has no history of diseases nor engages in risky social behavior (examples are intravenous drug use or sexual activity with multiple partners). The donor's travel history is screened to minimize risk of transmitting infections, such as malaria. The donated blood is tested for signs of infectious diseases, such as HIV and hepatitis. The blood is then tested to be sure it is compatible with you in order to minimize the chance of a transfusion reaction. If you or a relative donates blood, this is often done in anticipation of surgery and is not appropriate for emergency situations. It takes many days to process the donated blood. RISKS AND COMPLICATIONS Although transfusion therapy is very safe and saves many lives, the main dangers of transfusion include:   Getting an infectious disease.  Developing a transfusion reaction. This is an allergic reaction to something in the blood you were given. Every precaution is taken to prevent this. The decision to have a blood transfusion has been considered carefully by your caregiver before blood is given. Blood is not given unless the benefits outweigh  the risks. AFTER THE TRANSFUSION  Right after receiving a blood transfusion, you will usually feel much better and more energetic. This is especially true  if your red blood cells have gotten low (anemic). The transfusion raises the level of the red blood cells which carry oxygen, and this usually causes an energy increase.  The nurse administering the transfusion will monitor you carefully for complications. HOME CARE INSTRUCTIONS  No special instructions are needed after a transfusion. You may find your energy is better. Speak with your caregiver about any limitations on activity for underlying diseases you may have. SEEK MEDICAL CARE IF:   Your condition is not improving after your transfusion.  You develop redness or irritation at the intravenous (IV) site. SEEK IMMEDIATE MEDICAL CARE IF:  Any of the following symptoms occur over the next 12 hours:  Shaking chills.  You have a temperature by mouth above 102 F (38.9 C), not controlled by medicine.  Chest, back, or muscle pain.  People around you feel you are not acting correctly or are confused.  Shortness of breath or difficulty breathing.  Dizziness and fainting.  You get a rash or develop hives.  You have a decrease in urine output.  Your urine turns a dark color or changes to pink, red, or brown. Any of the following symptoms occur over the next 10 days:  You have a temperature by mouth above 102 F (38.9 C), not controlled by medicine.  Shortness of breath.  Weakness after normal activity.  The white part of the eye turns yellow (jaundice).  You have a decrease in the amount of urine or are urinating less often.  Your urine turns a dark color or changes to pink, red, or brown. Document Released: 06/28/2000 Document Revised: 09/23/2011 Document Reviewed: 02/15/2008 [facility] Patient Information 2014 [place], [place].  _______________________________________________________________________

## 2015-05-18 NOTE — Progress Notes (Addendum)
Clearance note per Dr Marlou Porch / cardiology epic 03/14/2015 CT chest epic 02/07/2015 EKG epic 03/14/2015

## 2015-05-19 ENCOUNTER — Encounter (HOSPITAL_COMMUNITY): Payer: Self-pay

## 2015-05-19 LAB — HEMOGLOBIN A1C
HEMOGLOBIN A1C: 6 % — AB (ref 4.8–5.6)
MEAN PLASMA GLUCOSE: 126 mg/dL

## 2015-05-19 NOTE — Progress Notes (Signed)
A1C results in epic per PAT visit 05/18/2015 sent to Dr Johney [place]

## 2015-05-24 MED ORDER — CLINDAMYCIN PHOSPHATE 900 MG/50ML IV SOLN
900.0000 mg | INTRAVENOUS | Status: AC
  Administered 2015-05-25 (×2): 900 mg via INTRAVENOUS

## 2015-05-24 MED ORDER — SODIUM CHLORIDE 0.9 % IV SOLN
INTRAVENOUS | Status: DC
  Filled 2015-05-24: qty 6

## 2015-05-24 MED ORDER — GENTAMICIN SULFATE 40 MG/ML IJ SOLN
5.0000 mg/kg | INTRAVENOUS | Status: AC
  Administered 2015-05-25: 520 mg via INTRAVENOUS
  Filled 2015-05-24 (×2): qty 13

## 2015-05-25 ENCOUNTER — Inpatient Hospital Stay (HOSPITAL_COMMUNITY): Payer: BLUE CROSS/BLUE SHIELD

## 2015-05-25 ENCOUNTER — Encounter (HOSPITAL_COMMUNITY): Payer: Self-pay | Admitting: *Deleted

## 2015-05-25 ENCOUNTER — Inpatient Hospital Stay (HOSPITAL_COMMUNITY): Payer: BLUE CROSS/BLUE SHIELD | Admitting: Anesthesiology

## 2015-05-25 ENCOUNTER — Encounter (HOSPITAL_COMMUNITY)
Admission: RE | Disposition: A | Discharge status: Home / Self Care | Payer: Self-pay | Source: Ambulatory Visit | Attending: Surgery

## 2015-05-25 ENCOUNTER — Inpatient Hospital Stay (HOSPITAL_COMMUNITY)
Admission: RE | Admit: 2015-05-25 | Discharge: 2015-05-29 | DRG: 330 | Disposition: A | Discharge status: Home / Self Care | Payer: BLUE CROSS/BLUE SHIELD | Source: Ambulatory Visit | Attending: Surgery | Admitting: Surgery

## 2015-05-25 DIAGNOSIS — Z86010 Personal history of colonic polyps: Secondary | ICD-10-CM

## 2015-05-25 DIAGNOSIS — D1803 Hemangioma of intra-abdominal structures: Secondary | ICD-10-CM | POA: Diagnosis present

## 2015-05-25 DIAGNOSIS — Z01812 Encounter for preprocedural laboratory examination: Secondary | ICD-10-CM | POA: Diagnosis not present

## 2015-05-25 DIAGNOSIS — Z833 Family history of diabetes mellitus: Secondary | ICD-10-CM | POA: Diagnosis not present

## 2015-05-25 DIAGNOSIS — Z808 Family history of malignant neoplasm of other organs or systems: Secondary | ICD-10-CM

## 2015-05-25 DIAGNOSIS — M179 Osteoarthritis of knee, unspecified: Secondary | ICD-10-CM | POA: Diagnosis present

## 2015-05-25 DIAGNOSIS — K589 Irritable bowel syndrome without diarrhea: Secondary | ICD-10-CM | POA: Diagnosis present

## 2015-05-25 DIAGNOSIS — Z6841 Body Mass Index (BMI) 40.0 and over, adult: Secondary | ICD-10-CM | POA: Diagnosis not present

## 2015-05-25 DIAGNOSIS — I341 Nonrheumatic mitral (valve) prolapse: Secondary | ICD-10-CM | POA: Diagnosis present

## 2015-05-25 DIAGNOSIS — Z823 Family history of stroke: Secondary | ICD-10-CM | POA: Diagnosis not present

## 2015-05-25 DIAGNOSIS — F329 Major depressive disorder, single episode, unspecified: Secondary | ICD-10-CM | POA: Diagnosis present

## 2015-05-25 DIAGNOSIS — E66813 Obesity, class 3: Secondary | ICD-10-CM

## 2015-05-25 DIAGNOSIS — E559 Vitamin D deficiency, unspecified: Secondary | ICD-10-CM | POA: Diagnosis present

## 2015-05-25 DIAGNOSIS — I10 Essential (primary) hypertension: Secondary | ICD-10-CM | POA: Diagnosis present

## 2015-05-25 DIAGNOSIS — Z923 Personal history of irradiation: Secondary | ICD-10-CM | POA: Diagnosis not present

## 2015-05-25 DIAGNOSIS — Z8249 Family history of ischemic heart disease and other diseases of the circulatory system: Secondary | ICD-10-CM

## 2015-05-25 DIAGNOSIS — K432 Incisional hernia without obstruction or gangrene: Secondary | ICD-10-CM | POA: Diagnosis present

## 2015-05-25 DIAGNOSIS — G8929 Other chronic pain: Secondary | ICD-10-CM | POA: Diagnosis present

## 2015-05-25 DIAGNOSIS — Z9221 Personal history of antineoplastic chemotherapy: Secondary | ICD-10-CM | POA: Diagnosis not present

## 2015-05-25 DIAGNOSIS — Z800 Family history of malignant neoplasm of digestive organs: Secondary | ICD-10-CM

## 2015-05-25 DIAGNOSIS — K219 Gastro-esophageal reflux disease without esophagitis: Secondary | ICD-10-CM | POA: Diagnosis present

## 2015-05-25 DIAGNOSIS — Z79891 Long term (current) use of opiate analgesic: Secondary | ICD-10-CM

## 2015-05-25 DIAGNOSIS — G4730 Sleep apnea, unspecified: Secondary | ICD-10-CM | POA: Diagnosis present

## 2015-05-25 DIAGNOSIS — C19 Malignant neoplasm of rectosigmoid junction: Secondary | ICD-10-CM | POA: Diagnosis present

## 2015-05-25 DIAGNOSIS — Z882 Allergy status to sulfonamides status: Secondary | ICD-10-CM

## 2015-05-25 DIAGNOSIS — E7800 Pure hypercholesterolemia, unspecified: Secondary | ICD-10-CM | POA: Diagnosis present

## 2015-05-25 DIAGNOSIS — Z90710 Acquired absence of both cervix and uterus: Secondary | ICD-10-CM | POA: Diagnosis not present

## 2015-05-25 DIAGNOSIS — K5090 Crohn's disease, unspecified, without complications: Secondary | ICD-10-CM | POA: Diagnosis present

## 2015-05-25 DIAGNOSIS — Z884 Allergy status to anesthetic agent status: Secondary | ICD-10-CM

## 2015-05-25 DIAGNOSIS — K660 Peritoneal adhesions (postprocedural) (postinfection): Secondary | ICD-10-CM | POA: Diagnosis present

## 2015-05-25 DIAGNOSIS — C20 Malignant neoplasm of rectum: Secondary | ICD-10-CM | POA: Diagnosis present

## 2015-05-25 DIAGNOSIS — Z87440 Personal history of urinary (tract) infections: Secondary | ICD-10-CM | POA: Diagnosis not present

## 2015-05-25 DIAGNOSIS — Z880 Allergy status to penicillin: Secondary | ICD-10-CM

## 2015-05-25 DIAGNOSIS — D649 Anemia, unspecified: Secondary | ICD-10-CM | POA: Diagnosis present

## 2015-05-25 DIAGNOSIS — F32A Depression, unspecified: Secondary | ICD-10-CM | POA: Diagnosis present

## 2015-05-25 DIAGNOSIS — Z95828 Presence of other vascular implants and grafts: Secondary | ICD-10-CM

## 2015-05-25 DIAGNOSIS — Z79899 Other long term (current) drug therapy: Secondary | ICD-10-CM | POA: Diagnosis not present

## 2015-05-25 HISTORY — PX: PORTACATH PLACEMENT: SHX2246

## 2015-05-25 HISTORY — PX: XI ROBOTIC ASSISTED LOWER ANTERIOR RESECTION: SHX6558

## 2015-05-25 LAB — TYPE AND SCREEN
ABO/RH(D): O POS
ANTIBODY SCREEN: NEGATIVE

## 2015-05-25 SURGERY — RESECTION, RECTUM, LOW ANTERIOR, ROBOT-ASSISTED
Anesthesia: General

## 2015-05-25 MED ORDER — LACTATED RINGERS IV SOLN: INTRAVENOUS | Status: DC

## 2015-05-25 MED ORDER — MIDAZOLAM HCL 2 MG/2ML IJ SOLN
INTRAMUSCULAR | Status: AC
  Filled 2015-05-25: qty 4

## 2015-05-25 MED ORDER — SPOT INK MARKER SYRINGE KIT
PACK | SUBMUCOSAL | Status: DC | PRN
  Administered 2015-05-25: 10 mL via SUBMUCOSAL

## 2015-05-25 MED ORDER — SPOT INK MARKER SYRINGE KIT
PACK | SUBMUCOSAL | Status: AC
  Filled 2015-05-25: qty 10

## 2015-05-25 MED ORDER — PROMETHAZINE HCL 25 MG/ML IJ SOLN: 6.2500 mg | INTRAMUSCULAR | Status: DC | PRN

## 2015-05-25 MED ORDER — GLYCOPYRROLATE 0.2 MG/ML IJ SOLN
INTRAMUSCULAR | Status: AC
  Filled 2015-05-25: qty 3

## 2015-05-25 MED ORDER — CHLORHEXIDINE GLUCONATE 4 % EX LIQD: 60.0000 mL | Freq: Once | CUTANEOUS | Status: DC

## 2015-05-25 MED ORDER — BUPIVACAINE LIPOSOME 1.3 % IJ SUSP
20.0000 mL | INTRAMUSCULAR | Status: DC
  Filled 2015-05-25: qty 20

## 2015-05-25 MED ORDER — LACTATED RINGERS IV SOLN
INTRAVENOUS | Status: DC | PRN
  Administered 2015-05-25 (×3): via INTRAVENOUS

## 2015-05-25 MED ORDER — PANTOPRAZOLE SODIUM 40 MG PO TBEC
80.0000 mg | DELAYED_RELEASE_TABLET | Freq: Two times a day (BID) | ORAL | Status: DC
  Administered 2015-05-25 – 2015-05-29 (×8): 80 mg via ORAL
  Filled 2015-05-25 (×9): qty 2

## 2015-05-25 MED ORDER — SCOPOLAMINE 1 MG/3DAYS TD PT72
MEDICATED_PATCH | TRANSDERMAL | Status: DC | PRN
  Administered 2015-05-25: 1 via TRANSDERMAL

## 2015-05-25 MED ORDER — ALVIMOPAN 12 MG PO CAPS
12.0000 mg | ORAL_CAPSULE | Freq: Two times a day (BID) | ORAL | Status: DC
  Administered 2015-05-26 – 2015-05-28 (×5): 12 mg via ORAL
  Filled 2015-05-25 (×6): qty 1

## 2015-05-25 MED ORDER — HEPARIN SODIUM (PORCINE) 5000 UNIT/ML IJ SOLN
5000.0000 [IU] | Freq: Once | INTRAMUSCULAR | Status: AC
  Administered 2015-05-25: 5000 [IU] via SUBCUTANEOUS
  Filled 2015-05-25: qty 1

## 2015-05-25 MED ORDER — 0.9 % SODIUM CHLORIDE (POUR BTL) OPTIME
TOPICAL | Status: DC | PRN
  Administered 2015-05-25: 2000 mL

## 2015-05-25 MED ORDER — MENTHOL 3 MG MT LOZG: 1.0000 | LOZENGE | OROMUCOSAL | Status: DC | PRN

## 2015-05-25 MED ORDER — ROCURONIUM BROMIDE 100 MG/10ML IV SOLN
INTRAVENOUS | Status: DC | PRN
  Administered 2015-05-25 (×2): 20 mg via INTRAVENOUS
  Administered 2015-05-25: 40 mg via INTRAVENOUS
  Administered 2015-05-25 (×2): 20 mg via INTRAVENOUS
  Administered 2015-05-25: 40 mg via INTRAVENOUS
  Administered 2015-05-25: 20 mg via INTRAVENOUS

## 2015-05-25 MED ORDER — SODIUM CHLORIDE 0.9 % IJ SOLN
INTRAMUSCULAR | Status: DC | PRN
  Administered 2015-05-25: 50 mL via INTRAVENOUS

## 2015-05-25 MED ORDER — ONDANSETRON HCL 4 MG PO TABS: 4.0000 mg | ORAL_TABLET | Freq: Four times a day (QID) | ORAL | Status: DC | PRN

## 2015-05-25 MED ORDER — HYDROMORPHONE HCL 1 MG/ML IJ SOLN
0.5000 mg | INTRAMUSCULAR | Status: DC | PRN
  Administered 2015-05-25 – 2015-05-26 (×4): 1 mg via INTRAVENOUS
  Filled 2015-05-25 (×2): qty 1
  Filled 2015-05-25: qty 2
  Filled 2015-05-25: qty 1

## 2015-05-25 MED ORDER — PROPOFOL 10 MG/ML IV BOLUS
INTRAVENOUS | Status: AC
  Filled 2015-05-25: qty 20

## 2015-05-25 MED ORDER — SUCCINYLCHOLINE CHLORIDE 20 MG/ML IJ SOLN
INTRAMUSCULAR | Status: DC | PRN
Start: 2015-05-25 — End: 2015-05-25
  Administered 2015-05-25: 100 mg via INTRAVENOUS

## 2015-05-25 MED ORDER — ENOXAPARIN SODIUM 40 MG/0.4ML ~~LOC~~ SOLN
40.0000 mg | SUBCUTANEOUS | Status: DC
  Administered 2015-05-26 – 2015-05-29 (×4): 40 mg via SUBCUTANEOUS
  Filled 2015-05-25 (×5): qty 0.4

## 2015-05-25 MED ORDER — HEPARIN SODIUM (PORCINE) 5000 UNIT/ML IJ SOLN
Freq: Once | INTRAMUSCULAR | Status: AC
  Administered 2015-05-25: 14:00:00
  Filled 2015-05-25: qty 1.2

## 2015-05-25 MED ORDER — FENTANYL CITRATE (PF) 100 MCG/2ML IJ SOLN
25.0000 ug | INTRAMUSCULAR | Status: DC | PRN
  Administered 2015-05-25: 25 ug via INTRAVENOUS

## 2015-05-25 MED ORDER — DEXAMETHASONE SODIUM PHOSPHATE 10 MG/ML IJ SOLN
INTRAMUSCULAR | Status: AC
  Filled 2015-05-25: qty 1

## 2015-05-25 MED ORDER — DIPHENHYDRAMINE HCL 12.5 MG/5ML PO ELIX: 12.5000 mg | ORAL_SOLUTION | Freq: Four times a day (QID) | ORAL | Status: DC | PRN

## 2015-05-25 MED ORDER — HEPARIN SOD (PORK) LOCK FLUSH 100 UNIT/ML IV SOLN
INTRAVENOUS | Status: DC | PRN
  Administered 2015-05-25: 500 [IU] via INTRAVENOUS

## 2015-05-25 MED ORDER — FENTANYL CITRATE (PF) 250 MCG/5ML IJ SOLN
INTRAMUSCULAR | Status: AC
  Filled 2015-05-25: qty 25

## 2015-05-25 MED ORDER — ROCURONIUM BROMIDE 100 MG/10ML IV SOLN
INTRAVENOUS | Status: AC
  Filled 2015-05-25: qty 1

## 2015-05-25 MED ORDER — DIPHENHYDRAMINE HCL 50 MG/ML IJ SOLN: 12.5000 mg | Freq: Four times a day (QID) | INTRAMUSCULAR | Status: DC | PRN

## 2015-05-25 MED ORDER — SCOPOLAMINE 1 MG/3DAYS TD PT72
MEDICATED_PATCH | TRANSDERMAL | Status: AC
  Filled 2015-05-25: qty 1

## 2015-05-25 MED ORDER — NEOSTIGMINE METHYLSULFATE 10 MG/10ML IV SOLN
INTRAVENOUS | Status: DC | PRN
  Administered 2015-05-25: 4 mg via INTRAVENOUS

## 2015-05-25 MED ORDER — DULOXETINE HCL 60 MG PO CPEP
60.0000 mg | ORAL_CAPSULE | Freq: Every day | ORAL | Status: DC
  Administered 2015-05-25 – 2015-05-28 (×4): 60 mg via ORAL
  Filled 2015-05-25 (×5): qty 1

## 2015-05-25 MED ORDER — ONDANSETRON HCL 4 MG/2ML IJ SOLN
INTRAMUSCULAR | Status: DC | PRN
  Administered 2015-05-25: 4 mg via INTRAVENOUS

## 2015-05-25 MED ORDER — ROSUVASTATIN CALCIUM 5 MG PO TABS
5.0000 mg | ORAL_TABLET | Freq: Every day | ORAL | Status: DC
  Administered 2015-05-25 – 2015-05-28 (×4): 5 mg via ORAL
  Filled 2015-05-25 (×5): qty 1

## 2015-05-25 MED ORDER — PHENYLEPHRINE HCL 10 MG/ML IJ SOLN
INTRAMUSCULAR | Status: DC | PRN
  Administered 2015-05-25: 80 ug via INTRAVENOUS

## 2015-05-25 MED ORDER — ACETAMINOPHEN 10 MG/ML IV SOLN
INTRAVENOUS | Status: AC
  Filled 2015-05-25: qty 100

## 2015-05-25 MED ORDER — ALVIMOPAN 12 MG PO CAPS
12.0000 mg | ORAL_CAPSULE | Freq: Once | ORAL | Status: AC
  Administered 2015-05-25: 12 mg via ORAL
  Filled 2015-05-25: qty 1

## 2015-05-25 MED ORDER — HYDROMORPHONE HCL 2 MG/ML IJ SOLN
INTRAMUSCULAR | Status: AC
  Filled 2015-05-25: qty 1

## 2015-05-25 MED ORDER — CLINDAMYCIN PHOSPHATE 900 MG/50ML IV SOLN
INTRAVENOUS | Status: AC
  Filled 2015-05-25: qty 50

## 2015-05-25 MED ORDER — BUPIVACAINE-EPINEPHRINE 0.25% -1:200000 IJ SOLN
INTRAMUSCULAR | Status: DC | PRN
  Administered 2015-05-25: 10 mL

## 2015-05-25 MED ORDER — CHLORHEXIDINE GLUCONATE 4 % EX LIQD
60.0000 mL | Freq: Once | CUTANEOUS | Status: DC
Start: 2015-05-25 — End: 2015-05-25

## 2015-05-25 MED ORDER — ARTIFICIAL TEARS OP OINT
TOPICAL_OINTMENT | OPHTHALMIC | Status: AC
  Filled 2015-05-25: qty 3.5

## 2015-05-25 MED ORDER — SODIUM CHLORIDE 0.9 % IV SOLN
INTRAVENOUS | Status: DC
  Administered 2015-05-25 – 2015-05-26 (×2): via INTRAVENOUS

## 2015-05-25 MED ORDER — GLYCOPYRROLATE 0.2 MG/ML IJ SOLN
INTRAMUSCULAR | Status: DC | PRN
  Administered 2015-05-25: 0.6 mg via INTRAVENOUS

## 2015-05-25 MED ORDER — SODIUM CHLORIDE 0.9 % IV SOLN
INTRAVENOUS | Status: DC | PRN
  Administered 2015-05-25: 09:00:00 via INTRAPERITONEAL

## 2015-05-25 MED ORDER — PHENOL 1.4 % MT LIQD: 2.0000 | OROMUCOSAL | Status: DC | PRN

## 2015-05-25 MED ORDER — SODIUM CHLORIDE 0.9 % IJ SOLN
INTRAMUSCULAR | Status: AC
  Filled 2015-05-25: qty 100

## 2015-05-25 MED ORDER — ACETAMINOPHEN 10 MG/ML IV SOLN
INTRAVENOUS | Status: DC | PRN
  Administered 2015-05-25: 1000 mg via INTRAVENOUS

## 2015-05-25 MED ORDER — LACTATED RINGERS IV BOLUS (SEPSIS): 1000.0000 mL | Freq: Three times a day (TID) | INTRAVENOUS | Status: AC | PRN

## 2015-05-25 MED ORDER — ONDANSETRON HCL 4 MG/2ML IJ SOLN
INTRAMUSCULAR | Status: AC
  Filled 2015-05-25: qty 2

## 2015-05-25 MED ORDER — NEBIVOLOL HCL 5 MG PO TABS
5.0000 mg | ORAL_TABLET | Freq: Every day | ORAL | Status: DC
  Administered 2015-05-28: 5 mg via ORAL
  Filled 2015-05-25 (×5): qty 1

## 2015-05-25 MED ORDER — HEPARIN SOD (PORK) LOCK FLUSH 100 UNIT/ML IV SOLN
INTRAVENOUS | Status: AC
  Filled 2015-05-25: qty 5

## 2015-05-25 MED ORDER — LIP MEDEX EX OINT
1.0000 "application " | TOPICAL_OINTMENT | Freq: Two times a day (BID) | CUTANEOUS | Status: DC
  Administered 2015-05-25 – 2015-05-29 (×8): 1 via TOPICAL
  Filled 2015-05-25: qty 7

## 2015-05-25 MED ORDER — METOPROLOL TARTRATE 1 MG/ML IV SOLN
5.0000 mg | Freq: Four times a day (QID) | INTRAVENOUS | Status: DC | PRN
  Filled 2015-05-25: qty 5

## 2015-05-25 MED ORDER — DEXAMETHASONE SODIUM PHOSPHATE 10 MG/ML IJ SOLN
INTRAMUSCULAR | Status: DC | PRN
  Administered 2015-05-25: 10 mg via INTRAVENOUS

## 2015-05-25 MED ORDER — CLINDAMYCIN PHOSPHATE 900 MG/50ML IV SOLN
900.0000 mg | Freq: Three times a day (TID) | INTRAVENOUS | Status: AC
  Administered 2015-05-25: 900 mg via INTRAVENOUS
  Filled 2015-05-25: qty 50

## 2015-05-25 MED ORDER — FENTANYL CITRATE (PF) 250 MCG/5ML IJ SOLN
INTRAMUSCULAR | Status: DC | PRN
  Administered 2015-05-25: 50 ug via INTRAVENOUS
  Administered 2015-05-25: 100 ug via INTRAVENOUS
  Administered 2015-05-25 (×3): 50 ug via INTRAVENOUS

## 2015-05-25 MED ORDER — HYDROMORPHONE HCL 1 MG/ML IJ SOLN
INTRAMUSCULAR | Status: DC | PRN
  Administered 2015-05-25 (×2): .2 mg via INTRAVENOUS
  Administered 2015-05-25: .4 mg via INTRAVENOUS
  Administered 2015-05-25: .2 mg via INTRAVENOUS
  Administered 2015-05-25 (×2): .4 mg via INTRAVENOUS
  Administered 2015-05-25: .2 mg via INTRAVENOUS

## 2015-05-25 MED ORDER — LACTATED RINGERS IR SOLN
Status: DC | PRN
  Administered 2015-05-25: 1000 mL

## 2015-05-25 MED ORDER — NEOSTIGMINE METHYLSULFATE 10 MG/10ML IV SOLN
INTRAVENOUS | Status: AC
  Filled 2015-05-25: qty 1

## 2015-05-25 MED ORDER — MIDAZOLAM HCL 5 MG/5ML IJ SOLN
INTRAMUSCULAR | Status: DC | PRN
  Administered 2015-05-25 (×2): 1 mg via INTRAVENOUS

## 2015-05-25 MED ORDER — PHENYLEPHRINE 40 MCG/ML (10ML) SYRINGE FOR IV PUSH (FOR BLOOD PRESSURE SUPPORT)
PREFILLED_SYRINGE | INTRAVENOUS | Status: AC
  Filled 2015-05-25: qty 10

## 2015-05-25 MED ORDER — BUPIVACAINE LIPOSOME 1.3 % IJ SUSP
INTRAMUSCULAR | Status: DC | PRN
  Administered 2015-05-25: 20 mL

## 2015-05-25 MED ORDER — INDOCYANINE GREEN 25 MG IV SOLR
INTRAVENOUS | Status: DC | PRN
  Administered 2015-05-25: 5 mg via INTRAVENOUS

## 2015-05-25 MED ORDER — FENTANYL CITRATE (PF) 100 MCG/2ML IJ SOLN
INTRAMUSCULAR | Status: AC
  Filled 2015-05-25: qty 2

## 2015-05-25 MED ORDER — MAGIC MOUTHWASH
15.0000 mL | Freq: Four times a day (QID) | ORAL | Status: DC | PRN
Start: 2015-05-25 — End: 2015-05-29
  Filled 2015-05-25: qty 15

## 2015-05-25 MED ORDER — LACTATED RINGERS IV SOLN
INTRAVENOUS | Status: DC | PRN
  Administered 2015-05-25 (×3): via INTRAVENOUS

## 2015-05-25 MED ORDER — BUPIVACAINE-EPINEPHRINE 0.25% -1:200000 IJ SOLN
INTRAMUSCULAR | Status: AC
  Filled 2015-05-25: qty 2

## 2015-05-25 MED ORDER — ONDANSETRON HCL 4 MG/2ML IJ SOLN: 4.0000 mg | Freq: Four times a day (QID) | INTRAMUSCULAR | Status: DC | PRN

## 2015-05-25 MED ORDER — HYDROCODONE-ACETAMINOPHEN 10-325 MG PO TABS: 1.0000 | ORAL_TABLET | Freq: Four times a day (QID) | ORAL | Status: DC | PRN

## 2015-05-25 MED ORDER — ALUM & MAG HYDROXIDE-SIMETH 200-200-20 MG/5ML PO SUSP: 30.0000 mL | Freq: Four times a day (QID) | ORAL | Status: DC | PRN

## 2015-05-25 MED ORDER — ACETAMINOPHEN 500 MG PO TABS
1000.0000 mg | ORAL_TABLET | Freq: Three times a day (TID) | ORAL | Status: DC
  Administered 2015-05-25 – 2015-05-29 (×12): 1000 mg via ORAL
  Filled 2015-05-25 (×15): qty 2

## 2015-05-25 MED ORDER — PROPOFOL 10 MG/ML IV BOLUS
INTRAVENOUS | Status: DC | PRN
  Administered 2015-05-25: 150 mg via INTRAVENOUS

## 2015-05-25 MED ORDER — SACCHAROMYCES BOULARDII 250 MG PO CAPS
250.0000 mg | ORAL_CAPSULE | Freq: Two times a day (BID) | ORAL | Status: DC
  Administered 2015-05-25 – 2015-05-29 (×8): 250 mg via ORAL
  Filled 2015-05-25 (×9): qty 1

## 2015-05-25 SURGICAL SUPPLY — 126 items
APPLIER CLIP 5 13 M/L LIGAMAX5 (MISCELLANEOUS)
APPLIER CLIP ROT 10 11.4 M/L (STAPLE)
APR CLP MED LRG 11.4X10 (STAPLE)
APR CLP MED LRG 5 ANG JAW (MISCELLANEOUS)
BAG DECANTER FOR FLEXI CONT (MISCELLANEOUS) ×3 IMPLANT
BAG URINE DRAINAGE (UROLOGICAL SUPPLIES) ×3 IMPLANT
BLADE EXTENDED COATED 6.5IN (ELECTRODE) ×3 IMPLANT
BLADE HEX COATED 2.75 (ELECTRODE) ×3 IMPLANT
BLADE SURG SZ11 CARB STEEL (BLADE) IMPLANT
CABLE HIGH FREQUENCY MONO STRZ (ELECTRODE) IMPLANT
CANNULA REDUC XI 12-8 STAPL (CANNULA) ×1
CANNULA REDUC XI 12-8MM STAPL (CANNULA) ×1
CANNULA REDUCER 12-8 DVNC XI (CANNULA) ×1 IMPLANT
CATH FOLEY SILVER 30CC 28FR (CATHETERS) ×3 IMPLANT
CATH KIT ON-Q SILVERSOAK 7.5IN (CATHETERS) IMPLANT
CELLS DAT CNTRL 66122 CELL SVR (MISCELLANEOUS) IMPLANT
CHLORAPREP W/TINT 26ML (MISCELLANEOUS) ×6 IMPLANT
CLIP APPLIE 5 13 M/L LIGAMAX5 (MISCELLANEOUS) IMPLANT
CLIP APPLIE ROT 10 11.4 M/L (STAPLE) IMPLANT
CLIP LIGATING HEM O LOK PURPLE (MISCELLANEOUS) ×3 IMPLANT
CLIP LIGATING HEMO O LOK GREEN (MISCELLANEOUS) IMPLANT
COUNTER NEEDLE 20 DBL MAG RED (NEEDLE) ×6 IMPLANT
COVER MAYO STAND STRL (DRAPES) ×6 IMPLANT
COVER PROBE W GEL 5X96 (DRAPES) ×3 IMPLANT
COVER SURGICAL LIGHT HANDLE (MISCELLANEOUS) IMPLANT
COVER TIP SHEARS 8 DVNC (MISCELLANEOUS) ×1 IMPLANT
COVER TIP SHEARS 8MM DA VINCI (MISCELLANEOUS) ×2
DECANTER SPIKE VIAL GLASS SM (MISCELLANEOUS) ×3 IMPLANT
DEVICE TROCAR PUNCTURE CLOSURE (ENDOMECHANICALS) IMPLANT
DRAIN CHANNEL 19F RND (DRAIN) ×3 IMPLANT
DRAPE ARM DVNC X/XI (DISPOSABLE) ×4 IMPLANT
DRAPE C-ARM 42X120 X-RAY (DRAPES) ×3 IMPLANT
DRAPE COLUMN DVNC XI (DISPOSABLE) ×1 IMPLANT
DRAPE DA VINCI XI ARM (DISPOSABLE) ×8
DRAPE DA VINCI XI COLUMN (DISPOSABLE) ×2
DRAPE LAPAROTOMY T 98X78 PEDS (DRAPES) ×3 IMPLANT
DRAPE SURG IRRIG POUCH 19X23 (DRAPES) ×3 IMPLANT
DRAPE WARM FLUID 44X44 (DRAPE) IMPLANT
DRSG OPSITE POSTOP 4X10 (GAUZE/BANDAGES/DRESSINGS) IMPLANT
DRSG OPSITE POSTOP 4X6 (GAUZE/BANDAGES/DRESSINGS) ×3 IMPLANT
DRSG OPSITE POSTOP 4X8 (GAUZE/BANDAGES/DRESSINGS) IMPLANT
DRSG TEGADERM 2-3/8X2-3/4 SM (GAUZE/BANDAGES/DRESSINGS) ×6 IMPLANT
DRSG TEGADERM 4X4.75 (GAUZE/BANDAGES/DRESSINGS) ×3 IMPLANT
ELECT PENCIL ROCKER SW 15FT (MISCELLANEOUS) IMPLANT
ELECT REM PT RETURN 9FT ADLT (ELECTROSURGICAL) ×3
ELECTRODE REM PT RTRN 9FT ADLT (ELECTROSURGICAL) ×1 IMPLANT
ENDOLOOP SUT PDS II  0 18 (SUTURE)
ENDOLOOP SUT PDS II 0 18 (SUTURE) IMPLANT
EVACUATOR SILICONE 100CC (DRAIN) ×3 IMPLANT
GAUZE SPONGE 2X2 8PLY STRL LF (GAUZE/BANDAGES/DRESSINGS) ×2 IMPLANT
GAUZE SPONGE 4X4 12PLY STRL (GAUZE/BANDAGES/DRESSINGS) IMPLANT
GAUZE SPONGE 4X4 16PLY XRAY LF (GAUZE/BANDAGES/DRESSINGS) ×3 IMPLANT
GLOVE ECLIPSE 8.0 STRL XLNG CF (GLOVE) ×9 IMPLANT
GLOVE INDICATOR 8.0 STRL GRN (GLOVE) ×9 IMPLANT
GOWN STRL REUS W/TWL XL LVL3 (GOWN DISPOSABLE) ×24 IMPLANT
KIT BASIN OR (CUSTOM PROCEDURE TRAY) ×3 IMPLANT
KIT PORT POWER 8FR ISP CVUE (Catheter) ×3 IMPLANT
KIT PROCEDURE DA VINCI SI (MISCELLANEOUS)
KIT PROCEDURE DVNC SI (MISCELLANEOUS) IMPLANT
LEGGING LITHOTOMY PAIR STRL (DRAPES) ×3 IMPLANT
LUBRICANT JELLY K Y 4OZ (MISCELLANEOUS) ×3 IMPLANT
NEEDLE HYPO 25X1 1.5 SAFETY (NEEDLE) ×3 IMPLANT
NEEDLE INSUFFLATION 14GA 120MM (NEEDLE) ×3 IMPLANT
PACK BASIC VI WITH GOWN DISP (CUSTOM PROCEDURE TRAY) ×3 IMPLANT
PACK CARDIOVASCULAR III (CUSTOM PROCEDURE TRAY) ×3 IMPLANT
PACK COLON (CUSTOM PROCEDURE TRAY) ×3 IMPLANT
PAD POSITIONING PINK XL (MISCELLANEOUS) ×3 IMPLANT
PEN SKIN MARKING BROAD (MISCELLANEOUS) ×3 IMPLANT
PORT LAP GEL ALEXIS MED 5-9CM (MISCELLANEOUS) ×3 IMPLANT
RTRCTR WOUND ALEXIS 18CM MED (MISCELLANEOUS)
SCISSORS LAP 5X35 DISP (ENDOMECHANICALS) ×3 IMPLANT
SCRUB PCMX 4 OZ (MISCELLANEOUS) ×3 IMPLANT
SEAL CANN UNIV 5-8 DVNC XI (MISCELLANEOUS) ×5 IMPLANT
SEAL XI 5MM-8MM UNIVERSAL (MISCELLANEOUS) ×10
SEALER VESSEL DA VINCI XI (MISCELLANEOUS) ×2
SEALER VESSEL EXT DVNC XI (MISCELLANEOUS) ×1 IMPLANT
SET IRRIG TUBING LAPAROSCOPIC (IRRIGATION / IRRIGATOR) ×3 IMPLANT
SLEEVE XCEL OPT CAN 5 100 (ENDOMECHANICALS) IMPLANT
SOLUTION ELECTROLUBE (MISCELLANEOUS) ×3 IMPLANT
SPONGE GAUZE 2X2 STER 10/PKG (GAUZE/BANDAGES/DRESSINGS) ×4
STAPLER 45 BLU RELOAD XI (STAPLE) IMPLANT
STAPLER 45 BLUE RELOAD XI (STAPLE)
STAPLER 45 GREEN RELOAD XI (STAPLE) ×6
STAPLER 45 GRN RELOAD XI (STAPLE) ×3 IMPLANT
STAPLER CANNULA SEAL DVNC XI (STAPLE) ×1 IMPLANT
STAPLER CANNULA SEAL XI (STAPLE) ×2
STAPLER CIRC ILS CVD 33MM 37CM (STAPLE) ×3 IMPLANT
STAPLER SHEATH (SHEATH) ×2
STAPLER SHEATH ENDOWRIST DVNC (SHEATH) ×1 IMPLANT
SUCTION POOLE TIP (SUCTIONS) ×6 IMPLANT
SUT MNCRL AB 4-0 PS2 18 (SUTURE) ×6 IMPLANT
SUT PDS AB 1 CTX 36 (SUTURE) IMPLANT
SUT PDS AB 1 TP1 96 (SUTURE) ×6 IMPLANT
SUT PDS AB 2-0 CT2 27 (SUTURE) IMPLANT
SUT PROLENE 0 CT 2 (SUTURE) ×3 IMPLANT
SUT PROLENE 2 0 SH DA (SUTURE) ×3 IMPLANT
SUT SILK 2 0 (SUTURE) ×3
SUT SILK 2 0 SH CR/8 (SUTURE) ×3 IMPLANT
SUT SILK 2-0 18XBRD TIE 12 (SUTURE) ×1 IMPLANT
SUT SILK 3 0 (SUTURE) ×3
SUT SILK 3 0 SH CR/8 (SUTURE) ×3 IMPLANT
SUT SILK 3-0 18XBRD TIE 12 (SUTURE) ×1 IMPLANT
SUT V-LOC BARB 180 2/0GR6 GS22 (SUTURE)
SUT VIC AB 0 UR5 27 (SUTURE) ×3 IMPLANT
SUT VIC AB 3-0 SH 18 (SUTURE) IMPLANT
SUT VIC AB 3-0 SH 27 (SUTURE)
SUT VIC AB 3-0 SH 27XBRD (SUTURE) IMPLANT
SUT VICRYL 0 UR6 27IN ABS (SUTURE) IMPLANT
SUT VLOC 180 2-0 9IN GS21 (SUTURE) IMPLANT
SUTURE V-LC BRB 180 2/0GR6GS22 (SUTURE) IMPLANT
SYR 20CC LL (SYRINGE) ×3 IMPLANT
SYR 30ML LL (SYRINGE) ×3 IMPLANT
SYRINGE 10CC LL (SYRINGE) ×3 IMPLANT
SYS LAPSCP GELPORT 120MM (MISCELLANEOUS)
SYSTEM LAPSCP GELPORT 120MM (MISCELLANEOUS) IMPLANT
TAPE UMBILICAL COTTON 1/8X30 (MISCELLANEOUS) ×3 IMPLANT
TOWEL OR 17X26 10 PK STRL BLUE (TOWEL DISPOSABLE) ×3 IMPLANT
TOWEL OR NON WOVEN STRL DISP B (DISPOSABLE) ×3 IMPLANT
TRAY FOLEY W/METER SILVER 14FR (SET/KITS/TRAYS/PACK) ×3 IMPLANT
TRAY FOLEY W/METER SILVER 16FR (SET/KITS/TRAYS/PACK) IMPLANT
TROCAR BLADELESS OPT 5 100 (ENDOMECHANICALS) ×3 IMPLANT
TROCAR BLADELESS OPT 5 150 (ENDOMECHANICALS) ×3 IMPLANT
TUBING CONNECTING 10 (TUBING) IMPLANT
TUBING CONNECTING 10' (TUBING)
TUBING FILTER THERMOFLATOR (ELECTROSURGICAL) ×3 IMPLANT
TUNNELER SHEATH ON-Q 16GX12 DP (PAIN MANAGEMENT) IMPLANT

## 2015-05-25 NOTE — Anesthesia Procedure Notes (Signed)
Procedure Name: Intubation Date/Time: 05/25/2015 8:02 AM Performed by: Dione Booze Pre-anesthesia Checklist: Emergency Drugs available, Suction available, Patient being monitored and Patient identified Patient Re-evaluated:Patient Re-evaluated prior to inductionOxygen Delivery Method: Circle system utilized Preoxygenation: Pre-oxygenation with 100% oxygen Intubation Type: IV induction Laryngoscope Size: Mac and 4 Grade View: Grade II Tube type: Oral Tube size: 7.5 mm Number of attempts: 1 Airway Equipment and Method: Stylet Placement Confirmation: ETT inserted through vocal cords under direct vision,  breath sounds checked- equal and bilateral and positive ETCO2 Secured at: 22 cm Tube secured with: Tape Dental Injury: Teeth and Oropharynx as per pre-operative assessment

## 2015-05-25 NOTE — Op Note (Signed)
05/25/2015  2:30 PM  PATIENT:  Lindsay Little  49 y.o. female  Patient Care Team: [name], MD as PCP - General (Family Medicine) Michael Boston, MD as Consulting Physician (General Surgery) Truitt Merle, MD as Consulting Physician (Medical Oncology) Kyung Rudd, MD as Consulting Physician (Radiation Oncology) Teena Irani, MD as Consulting Physician (Gastroenterology)  PRE-OPERATIVE DIAGNOSIS:  Proximal/Mid Rectal Cancer  POST-OPERATIVE DIAGNOSIS:  Proximal/Mid Rectal Cancer  PROCEDURE:   XI ROBOTIC ASSISTED RECTOSIGMOID LOWER ANTERIOR RESECTION Laparoscopic and robotic lysis of adhesions X 60 MINUTES RIGID PROCTOSCOPY RIGHT SALPINGO-OOPHORECTOMY INSERTION PORT-A-CATH  Surgeon(s): Michael Boston, MD Leighton Ruff, MD - assist  ANESTHESIA:   local and general  EBL:  Total I/O In: 4000 [I.V.:4000] Out: 700 [Urine:300; Blood:400]  Delay start of Pharmacological VTE agent (>24hrs) due to surgical blood loss or risk of bleeding:  no  DRAINS: (19Fr ) Blake drain(s) in the pelvis   SPECIMEN:    1.  Rectosigmoid colon 2.  Proximal anastomotic EEA ring 3.  Distal anastomotic EEA ring 4.  Right ovary & fallopian tube  DISPOSITION OF SPECIMEN:  PATHOLOGY  COUNTS:  YES  PLAN OF CARE: Admit to inpatient   PATIENT DISPOSITION:  PACU - hemodynamically stable.  INDICATION:    Pleasant morbidly obese female with bulky rectal cancer in proximal and mid rectum.  Staged as T3 N2.  Underwent neoadjuvant chemoradiation therapy.  Recommendation made for segmental resection.  Possible need for delivering loop ileostomy discussed.  Given probable need for postoperative adjuvant chemotherapy, request made for placement of portacatheter by her medical oncologist:  The anatomy & physiology of the digestive tract was discussed.  The pathophysiology was discussed.  Natural history risks without surgery was discussed.   I worked to give an overview of the disease and the frequent need to have  multispecialty involvement.  I feel the risks of no intervention will lead to serious problems that outweigh the operative risks; therefore, I recommended a partial colectomy to remove the pathology.  Laparoscopic & open techniques were discussed.   Risks such as bleeding, infection, abscess, leak, reoperation, possible ostomy, hernia, heart attack, death, and other risks were discussed.  I noted a good likelihood this will help address the problem.   Goals of post-operative recovery were discussed as well.  We will work to minimize complications.  Educational materials on the pathology had been given in the office.  Questions were answered.    Use of a central venous catheter for intravenous therapy was discussed.  Technique of catheter placement using ultrasound and fluoroscopy guidance was discussed.  Risks such as bleeding, infection, injury to other organs, need for repair of tissues / organs, need for further treatment, pneumothorax, catheter occlusion, reoperation, and other risks were discussed.   I noted a good likelihood this will help address the problem.  Questions were answered.  The patient expressed understanding & wishes to proceed.  OR FINDINGS:   Patient had a bulky tumor in proximal and mid rectum.  Most of it focused on the right anterolateral circumference.  Still came down to around 9 cm from the anal verge.  I enlarged right ovary was some firm and mainly cystic areas.  At least 6 cm in largest dimension.  Left side looked atrophic and normal.  Right ovary removed.  PowerPort is a ClearVUE 8 [place].  Reference #[id number] Lot number [id number] It is in the right internal jugular vein.  The tip sits at the right atrial/superior vena caval junction  No obvious  metastatic disease on visceral parietal peritoneum or liver.  The anastomosis rests 5-6 cm from the anal verge by rigid proctoscopy.    DESCRIPTION:   Informed consent was confirmed.  The patient underwent general  anaesthesia without difficulty.  The patient was positioned appropriately.  VTE prevention in place.  Surgical timeout confirmed our plan.  I proceeded with rigid proctoscopy.  I data filed the bulky tumor at around 9-centimeters in the anal verge.  Primarily on the right circumference.  I injected into the rectal wall and mesorectum circumferentially using in the Inc. I placed a Foley balloon in the rectum to allow the colon and rectum to decompress.  Prep was fair.  The patient's abdomen was clipped, prepped, & draped in a sterile fashion.  Surgical timeout confirmed our plan.  The patient was positioned in reverse Trendelenburg.  Abdominal entry was gained using Varess technique with a trach hook on the anterior abdominal wall fascia in the left upper abdomen.  Entry was clean.  I induced carbon dioxide insufflation.  Camera inspection revealed no injury.  Extra ports were carefully placed under direct laparoscopic visualization, taking care to avoid going too far through the panniculus given her super morbid obesity.  I did laparoscopic lysis of adhesions to free some greater omentum off the sigmoid colon.  I reflected the greater omentum and the upper abdomen the small bowel in the upper abdomen.  The patient was carefully positioned.  The Intuitive daVinci robot was carefully docked with camera & instruments carefully placed.  I mobilized the rectosigmoid colon off the left pelvis wall and vaginal cuff to help straighten it out.  Somewhat scarred   Patient also had a very large right ovary that looked primarily cystic but some firm areas as well.  Scarred pedunculated and somewhat adherent to the proximal rectum.  Do not seem to be invaded into but it was at least 6 cm in size.  Try to mobilize it out of the way but it was a persistent problem.  The left ovary and fallopian tube looked normal with mild atrophy.  Because of the size and one more ovary remaining in a post menopausal woman, I proceed with a  right oophorectomy.  I found the round ligament going into the internal ring.  I isolated it, skeletonized it, and transected it. I elevated the ovarian pedicle leaving the ovary more proximally.  I freed up somewhat thickened peritoneum consistent with prior radiation.  I skeletonized the pedicle.  I could see the right ureter and stayed away from it.  I transected it using a vessel sealer.   I freed the ovary from the remaining attachments of the right pelvic side water and vaginal cuff.  He was later removed inside the wound protector/extraction site.   I scored the base of peritoneum of the medial side of the mesentery of the left colon from the ligament of Treitz to the peritoneal reflection of the mid rectum.   I elevated the sigmoid mesentery and entered into the retro-mesenteric plane. We were able to identify the left ureter and gonadal vessels. We kept those posterior within the retroperitoneum and elevated the left colon mesentery off that. I did isolate the inferior mesenteric artery (IMA) pedicle but did not ligate it yet.  I continued distally and got into the avascular plane posterior to the mesorectum. This allowed me to help mobilize the rectum as well by freeing the mesorectum off the sacrum.  I mobilized the peritoneal coverings towards the peritoneal  reflection on both the right and left sides of the rectum.  I stayed away from the right and left ureters.  I kept the lateral vascular pedicles to the rectum intact.  I skeletonized the lymph nodes off the inferior mesenteric artery pedicle.  I went down to its takeoff from the aorta.  I isolated the inferior mesenteric vein off of the ligament of Treitz just cephalad to that as well.  After confirming the left ureter was out of the way, I went ahead and ligated the inferior mesenteric artery pedicle just near its takeoff from the aorta.  I ligated the IMA pedicle with hemo-lock plastic Wexner clips on the aorta side 2.  Then transected it with  a vessel sealer slightly distally.  I did ligate the inferior mesenteric vein with vessel sealer.  We ensured hemostasis. I mobilized the left colon in a lateral to medial fashion off the line of Toldt up towards the splenic flexure to ensure good mobilization of the remaining left colon to reach into the pelvis.  I then focused more on mesorectal dissection.  Freed the mesorectum off the presacral plane down to the pelvic floor posteriorly.  Came around anteriorly as well.  Patient had rather thickened peritoneum at the distal peritoneal reflection at the mid rectum.  I freed that off with hook.  I freed the rectum off the rectovaginal septum.  Came across the lateral pedicles as well.  Was quite bulky thickened and large, especially on the right anterior and posterior lateral side.  However in the distal rectum and thinned out and the distal mesorectum was much more soft.  I did digital examination confirmed that we had dissected 5 cm distal to the distal margin of the tumor.  I skeletonized at the distal mesorectum and transected at the distal rectum using a robotic 45 mm stapler x 3 loads.  I chose a region at the descending/sigmoid junction that was soft and easily reached down to the rectal stump.  I transected the mesentery of the colon radially to preserve remaining colon blood supply.  We then injected firefly immunofluorescent agent Trevino initially.  We confirmed the green dye perfused to the predicted transection point of the colon.  Also the rectal stump had good perfusion.  I placed a wound protector through a Pfannenstiel incision in the suprapubic region, taking care to avoid bladder injury.  I was able to eviscerate the rectosigmoid and descending colon out the wound.   Hemo-no there was some twisting of the rectum in the pelvis it did straighten out and the mesorectum sleeve looked rather intact.   Also eviscerated remove the right ovary.  I clamped the colon proximal to this area using a soft  bowel clamp. I transected at the descending/sigmoid junction with a scalpel. I got healthy bleeding mucosa.  We sent the rectosigmoid colon specimen off to go to pathology.  We sized the colon orifice.  I chose a 33 EEA anvil stapler system. I placed the anvil to the open end of the proximal remaining colon and closed around it using a 0 Prolene pursestring.  We did copious irrigation with crystalloid solution.  Hemostasis was good.  The distal end of the remaining colon easily reached down to the rectal stump, therefore, splenic flexure mobilization was not needed.      Dr Marcello Moores scrubbed down and did gentle anal dilation and advanced the EEA stapler up the rectal stump. The spike was brought out at the provimal end of the rectal  stump under direct visualization.  I attached the anvil of the proximal colon the spike of the stapler. Anvil was tightened down and held clamped for 60 seconds. The EEA stapler was fired and held clamped for 30 seconds. The stapler was released & removed. We noted 2 excellent anastomotic rings. Blue stitch is in the proximal ring.  Dr Marcello Moores did rigid proctoscopy noted the anastomosis was at 5-6 cm from the anal verge consistent with the proximal rectum.  We did a final irrigation of antibiotic solution (900 mg clindamycin/240 mg gentamicin in a liter of crystalloid) & held that for the pelvic air leak test .  The rectum was insufflated the rectum while clamping the colon proximal to that anastomosis.  There was a negative air leak test. There was no tension of mesentery or bowel at the anastomosis.   Tissues looked viable.  Ureters & bowel uninjured.  The anastomosis looked healthy.  She was morbidly obese the anastomosis was distal to the radiation, she is not a smoker, and the anastomosis was not distal and 5 cm.  Also her massive abdominal thickness would make her not tolerate a diverting loop ileostomy oil anyway.  With good perfusion, we decided to hold off on diverting  ileostomy.  Endoluminal gas was evacuated.  Ports & wound protector removed.  We changed gloves.  We aspirated the antibiotic irrigation.  Hemostasis was good.  Sterile unused instruments were used from this point out per colon SSI prevention protocol.  I closed the 33m port sites using Monocryl stitch and sterile dressing.  I closed the Pfannenstiel wound using a 0 Vicryl vertical peritoneal closure and a #1 PDS transverse anterior rectal fascial closure. I closed the skin with some interrupted Monocryl stitches. I placed antibiotic-soaked wicks into the closure at the corners = 2 total. I placed a sterile dressings.  The drain was secured to skin with a Prolene suture.  Location as noted above  Next I focused on placement of the portacatheter in the right internal jugular vein.  Neck and chest were clipped and prepped and draped in a sterile fashion. A surgical timeout confirmed our plan.  I placed a field block of local anesthesia on the neck & chest  I used the ultrasound to identify the right internal jugular vein. I entered into it using on the first venipuncture under direct ultrasound guidance. Wire was passed into the inferior vena cava by fluoroscopy.  I made an incision in the lateral infraclavicular pocket. Made a subcutaneous pocket. I tunneled the power port from the chest wound to the neck puncture site. The port was secured to the left anterior chest wall using  2-0 Prolene interrupted stitches x4 were placed on the pectoralis fascia just inferior to the infraclavicular wound.  These were placed through the port.  Port attached to the catheter.Catheter flushed well.  I used a dilator on the wire using Seldinger technique to dilate the neck tract. Then I used a dilator with a peel away sheath.  We used fluoroscopy.  I pulled the wire back into the right atrial/superior vena cava junction.  I pulled the wire back until it was at the neck puncture site. This gave the true measurement of the  intravenous segment. I cut the catheter to appropriate length. I removed the wire and dilator. The catheter was placed within the sheath. The sheath was peeled away.  Fluoroscopy confirmed the tip in the proximal right ventricle.  I pulled the catheter back a few centimeters until  it was better position at the right atrium/distal SVC .  Catheter aspirated and flushed well. On final fluoro reevaluation the tip seen to be in good position in the distal SVC.    I closed the wounds using 4 Monocryl stitch & placed sterile dressings. Catheter is okay to use.  Patient is extubated in the recovery room. I had discussed postop care with the patient in detail the office & in the holding area. Instructions are written. I updated the status of the patient to the patient's husband and parents.  I made recommendations.  I answered questions.  Understanding & appreciation was expressed.  [name], M.D., F.A.C.S. Gastrointestinal and Minimally Invasive Surgery [facility] [address] [place], [place] [id number] [phone] Main / Paging

## 2015-05-25 NOTE — Anesthesia Preprocedure Evaluation (Addendum)
Anesthesia Evaluation  Patient identified by MRN, date of birth, ID band Patient awake    Reviewed: Allergy & Precautions, NPO status , Patient's Chart, lab work & pertinent test results, reviewed documented beta blocker date and time   History of Anesthesia Complications (+) PONV and history of anesthetic complications  Airway Mallampati: II  TM Distance: <3 FB Neck ROM: Full    Dental  (+) Teeth Intact, Dental Advisory Given   Pulmonary sleep apnea , former smoker,    Pulmonary exam normal breath sounds clear to auscultation       Cardiovascular hypertension, Pt. on medications and Pt. on home beta blockers (-) angina(-) Past MI Normal cardiovascular exam Rhythm:Regular Rate:Normal     Neuro/Psych PSYCHIATRIC DISORDERS Depression negative neurological ROS     GI/Hepatic Neg liver ROS, GERD  Medicated,Rectal cancer   Endo/Other  Morbid obesity  Renal/GU negative Renal ROS     Musculoskeletal negative musculoskeletal ROS (+)   Abdominal   Peds  Hematology  (+) Blood dyscrasia, anemia ,   Anesthesia Other Findings Day of surgery medications reviewed with the patient.  Reproductive/Obstetrics                           Anesthesia Physical Anesthesia Plan  ASA: III  Anesthesia Plan: General   Post-op Pain Management:    Induction: Intravenous  Airway Management Planned: Oral ETT  Additional Equipment:   Intra-op Plan:   Post-operative Plan: Extubation in OR  Informed Consent: I have reviewed the patients History and Physical, chart, labs and discussed the procedure including the risks, benefits and alternatives for the proposed anesthesia with the patient or authorized representative who has indicated his/her understanding and acceptance.   Dental advisory given  Plan Discussed with: CRNA  Anesthesia Plan Comments: (Risks/benefits of general anesthesia discussed with patient  including risk of damage to teeth, lips, gum, and tongue, nausea/vomiting, allergic reactions to medications, and the possibility of heart attack, stroke and death.  All patient questions answered.  Patient wishes to proceed.)       Anesthesia Quick Evaluation

## 2015-05-25 NOTE — H&P (Signed)
Doxie Augenstein 02/07/2015 9:42 AM Location: [facility] Patient #: [id number] DOB: 1966/03/21 Married / Language: [name] / Race: White Female   History of Present Illness [name] MD; 02/07/2015 10:27 AM) The patient is a 49 year old female who presents with colorectal cancer. Patient sent for surgical consultation by Dr. Teena Irani with [facility] gastroenterology. Diagnosis of proximal rectal cancer.  Morbidly obese female. History of Crohn's disease and possible IBS. Chronic abdominal pain. Usually managed in [place]. More recently managed by [facility] gastroenterology. Workup for upper abdominal pain with normal upper GI series and ultrasound aside from some fatty change. Liver masses consistent with cavernous hemangiomas. Recent MRI improved stability. Hent panendoscopy. Found to have bulky mass from 12-20 centimeters from anal verge suspicious for rectal cancer. Biopsy confirmed. Ultrasound stages as T3 and 2. Surgical, medical, radiation oncology consultations made. Patient comes to consider surgical aspects of her multidisciplinary care. Family history cardiac disease - father passed away from myocardial infarction at age 7 - but no major concerns according to her cardiologist Candee Furbish last year. Cholesterol treated.  ChemoXRT done.  Ready for surgery  Other Problems Lars Mage Spillers, CMA; 02/07/2015 9:43 AM) Back Pain Diverticulosis Gastroesophageal Reflux Disease General anesthesia - complications High blood pressure Hypercholesterolemia Other disease, cancer, significant illness Rectal Cancer Transfusion history  Past Surgical History Lars Mage Spillers, CMA; 02/07/2015 9:43 AM) Colon Polyp Removal - Colonoscopy Hysterectomy (not due to cancer) - Partial Knee Surgery Left. Oral Surgery Shoulder Surgery Left.  Diagnostic Studies History Illene Regulus, CMA; 02/07/2015 9:43 AM) Colonoscopy >10 years ago Mammogram 1-3 years  ago Pap Smear 1-5 years ago  Allergies Lars Mage Spillers, CMA; 02/07/2015 9:47 AM) Lidocaine *CHEMICALS* All of the Caine's except MARCAINE is ok per the pt Penicillin G Benzathine & Proc *PENICILLINS* Sulfabenzamide *CHEMICALS*  Medication History (Alisha Spillers, CMA; 02/07/2015 9:49 AM) DULoxetine HCl (60MG Capsule DR Part, Oral) Active. Esomeprazole Magnesium (40MG Capsule DR, Oral) Active. Furosemide (20MG Tablet, Oral) Active. Hydrochlorothiazide (25MG Tablet, Oral) Active. Hydrocodone-Acetaminophen (5-325MG Tablet, Oral) Active. Hyoscyamine Sulfate ER (0.375MG Tablet ER 12HR, Oral) Active. Meloxicam (15MG Tablet, Oral) Active. Crestor (5MG Tablet, Oral) Active. Bystolic (81XB Tablet, Oral) Active. Coenzyme Q10 (100MG Capsule, Oral) Active. Donnatal (16.2MG Tablet, Oral) Active. Robaxin (500MG Tablet, Oral) Active. Vitamin D (1000UNIT Tablet, Oral) Active. Medications Reconciled  Pregnancy / Birth History Illene Regulus, CMA; 02/07/2015 9:43 AM) Age at menarche 35 years. Age of menopause 46-50 Gravida 4 Irregular periods Maternal age 53-20 Para 0    Review of Systems Lars Mage Spillers CMA; 02/07/2015 9:43 AM) General Present- Appetite Loss, Fatigue and Weight Loss. Not Present- Chills, Fever, Night Sweats and Weight Gain. Skin Present- Non-Healing Wounds. Not Present- Change in Wart/Mole, Dryness, Hives, Jaundice, New Lesions, Rash and Ulcer. HEENT Present- Wears glasses/contact lenses. Not Present- Earache, Hearing Loss, Hoarseness, Nose Bleed, Oral Ulcers, Ringing in the Ears, Seasonal Allergies, Sinus Pain, Sore Throat, Visual Disturbances and Yellow Eyes. Respiratory Not Present- Bloody sputum, Chronic Cough, Difficulty Breathing, Snoring and Wheezing. Cardiovascular Present- Leg Cramps and Swelling of Extremities. Not Present- Chest Pain, Difficulty Breathing Lying Down, Palpitations, Rapid Heart Rate and Shortness of Breath. Gastrointestinal  Present- Abdominal Pain, Bloating, Bloody Stool, Change in Bowel Habits, Chronic diarrhea, Excessive gas, Gets full quickly at meals, Nausea and Rectal Pain. Not Present- Constipation, Difficulty Swallowing, Hemorrhoids, Indigestion and Vomiting. Female Genitourinary Present- Pelvic Pain. Not Present- Frequency, Nocturia, Painful Urination and Urgency. Musculoskeletal Present- Back Pain, Joint Pain and Muscle Weakness. Not Present- Joint Stiffness, Muscle Pain and  Swelling of Extremities. Neurological Present- Headaches and Numbness. Not Present- Decreased Memory, Fainting, Seizures, Tingling, Tremor, Trouble walking and Weakness. Psychiatric Not Present- Anxiety, Bipolar, Change in Sleep Pattern, Depression, Fearful and Frequent crying. Endocrine Not Present- Cold Intolerance, Excessive Hunger, Hair Changes, Heat Intolerance, Hot flashes and New Diabetes. Hematology Present- Easy Bruising. Not Present- Excessive bleeding, Gland problems, HIV and Persistent Infections.  Vitals (Alisha Spillers CMA; 02/07/2015 9:45 AM) 02/07/2015 9:45 AM Weight: 344 lb Height: 70.5in Body Surface Area: 2.79 m Body Mass Index: 48.66 kg/m  Pulse: 68 (Regular)  BP: 124/74 (Sitting, Left Arm, Standard)     Physical Exam [name] MD; 02/07/2015 10:34 AM) General Mental Status-Alert. General Appearance-Not in acute distress, Not Sickly. Orientation-Oriented X3. Hydration-Well hydrated. Voice-Normal.  Integumentary Global Assessment Upon inspection and palpation of skin surfaces of the - Axillae: non-tender, no inflammation or ulceration, no drainage. and Distribution of scalp and body hair is normal. General Characteristics Temperature - normal warmth is noted.  Head and Neck Head-normocephalic, atraumatic with no lesions or palpable masses. Face Global Assessment - atraumatic, no absence of expression. Neck Global Assessment - no abnormal movements, no bruit auscultated on  the right, no bruit auscultated on the left, no decreased range of motion, non-tender. Trachea-midline. Thyroid Gland Characteristics - non-tender.  Eye Eyeball - Left-Extraocular movements intact, No Nystagmus. Eyeball - Right-Extraocular movements intact, No Nystagmus. Cornea - Left-No Hazy. Cornea - Right-No Hazy. Sclera/Conjunctiva - Left-No scleral icterus, No Discharge. Sclera/Conjunctiva - Right-No scleral icterus, No Discharge. Pupil - Left-Direct reaction to light normal. Pupil - Right-Direct reaction to light normal.  ENMT Ears Pinna - Left - no drainage observed, no generalized tenderness observed. Right - no drainage observed, no generalized tenderness observed. Nose and Sinuses External Inspection of the Nose - no destructive lesion observed. Inspection of the nares - Left - quiet respiration. Right - quiet respiration. Mouth and Throat Lips - Upper Lip - no fissures observed, no pallor noted. Lower Lip - no fissures observed, no pallor noted. Nasopharynx - no discharge present. Oral Cavity/Oropharynx - Tongue - no dryness observed. Oral Mucosa - no cyanosis observed. Hypopharynx - no evidence of airway distress observed.  Chest and Lung Exam Inspection Movements - Normal and Symmetrical. Accessory muscles - No use of accessory muscles in breathing. Palpation Palpation of the chest reveals - Non-tender. Auscultation Breath sounds - Normal and Clear.  Cardiovascular Auscultation Rhythm - Regular. Murmurs & Other Heart Sounds - Auscultation of the heart reveals - No Murmurs and No Systolic Clicks.  Abdomen Inspection Inspection of the abdomen reveals - No Visible peristalsis and No Abnormal pulsations. Umbilicus - No Bleeding, No Urine drainage. Palpation/Percussion Palpation and Percussion of the abdomen reveal - Soft, Non Tender, No Rebound tenderness, No Rigidity (guarding) and No Cutaneous hyperesthesia. Note: Morbidly obese but soft. Small  lump felt that based of umbilicus consistent with incarcerated hernia   Female Genitourinary Sexual Maturity Tanner 5 - Adult hair pattern. Note: No vaginal bleeding nor discharge   Rectal Note: Small LEFT lateral internal/external hemorrhoid. Mild anal tag anterior midline raphae. Normal sphincter tone. Sensitive. No mass felt to tip of finger but mild fullness felt anteriorly at tip. No discrete mass felt. No fissure. No fistula. No condyloma. No pilonidal disease   Peripheral Vascular Upper Extremity Inspection - Left - No Cyanotic nailbeds, Not Ischemic. Right - No Cyanotic nailbeds, Not Ischemic.  Neurologic Neurologic evaluation reveals -normal attention span and ability to concentrate, able to name objects and repeat phrases. Appropriate  fund of knowledge , normal sensation and normal coordination. Mental Status Affect - not angry, not paranoid. Cranial Nerves-Normal Bilaterally. Gait-Normal.  Neuropsychiatric Mental status exam performed with findings of-able to articulate well with normal speech/language, rate, volume and coherence, thought content normal with ability to perform basic computations and apply abstract reasoning and no evidence of hallucinations, delusions, obsessions or homicidal/suicidal ideation.  Musculoskeletal Global Assessment Spine, Ribs and Pelvis - no instability, subluxation or laxity. Right Upper Extremity - no instability, subluxation or laxity.  Lymphatic Head & Neck  General Head & Neck Lymphatics: Bilateral - Description - No Localized lymphadenopathy. Axillary  General Axillary Region: Bilateral - Description - No Localized lymphadenopathy. Femoral & Inguinal  Generalized Femoral & Inguinal Lymphatics: Left - Description - No Localized lymphadenopathy. Right - Description - No Localized lymphadenopathy.    Assessment & Plan [name] MD; 02/16/2015 6:22 PM) RECTAL ADENOCARCINOMA (154.1  C20) Impression: I think she  would benefit from multidisciplinary treatment.  Given the T3 N2 nature, agree with neoadjuvant chemoradiation therapy. Rectosigmoid but by CT scan distal end definitely in pelvis. No evidence of metastatic disease by abdomen and pelvis and MRI. CT chest pending today. CEA only 1.4. Would plan low anterior resection 10 weeks after completion of neoadjuvant chemoradiation therapy. Hopefully proximal enough to not require ileostomy diversion since I cannot feel it, but we will see. Given her severe morbid obesity, would prefer robotic or at least a minimally invasive approach. She is very interested in that.  Said some time discussing the pathophysiology, need for multidisciplinary approach, surgery, expected recovering follow-up. Questions answered. She and husband expressed appreciation and wished to proceed. Current Plans Schedule for Surgery  The anatomy & physiology of the digestive tract was discussed. The pathophysiology of the rectal pathology was discussed. Natural history risks without surgery was discussed. I worked to give an overview of the disease and the frequent need to have multispecialty involvement. I feel the risks of no intervention will lead to serious problems that outweigh the operative risks; therefore, I recommended a partial proctocolectomy to remove the pathology. Minimally Invasive (Robotic/Laparoscopic) & open techniques were discussed. We will work to preserve anal & pelvic floor function without sacrificing cure.  Risks such as bleeding, infection, abscess, leak, reoperation, possible temporary or permanent ostomy, hernia, heart attack, death, and other risks were discussed. I noted a good likelihood this will help address the problem. Goals of post-operative recovery were discussed as well. We will work to minimize complications. Educational information was available as well. Questions were answered. The patient expresses understanding & wishes to proceed with  surgery.  Addnedum:  Neoadj chemoXRT done.  Plan surgery.  Pt Education - Pamphlet Given - Colorectal Surgery: discussed with patient and provided information. Pt Education - [facility] Colorectal Cancer (AT) Pt Education - [facility] Pelvic Floor Exercises (Kegels) and Dysfunction HCI (Kaislee Chao) Pt Education - [place] (Miyonna Ormiston) Pt Education - [facility] Abdominal Surgery (Alaya Iverson) Started Neomycin Sulfate 500MG, 2 (two) Tablet SEE NOTE, #6, 02/07/2015, No Refill. Local Order: TAKE TWO TABLETS AT 2 PM, 3 PM, AND 10 PM THE DAY PRIOR TO SURGERY Started Flagyl 500MG, 2 (two) Tablet SEE NOTE, #6, 02/07/2015, No Refill. Local Order: Take at 2pm, 3pm, and 10pm the day prior to your colon operation Pt Education - [facility] Colon Bowel Prep 2015 Miralax/Antibiotics INCISIONAL HERNIA, INCARCERATED (552.21  K43.0) Impression: Small umbilical incisional hernia most likely from its numerous diagnostic laparoscopies to rule out endometriosis. May benefit repair at the time  of surgery but most likely we'll hold off unless symptomatic. Morbid obesity higher risk of hernia at that location. Usually I prefer to extract at the stapler port site or Pfannenstiel region to lower hernia and pain risks.  [name], M.D., F.A.C.S. Gastrointestinal and Minimally Invasive Surgery [facility] [address], [place], [place] [address] [phone] Main / Paging

## 2015-05-25 NOTE — Anesthesia Postprocedure Evaluation (Signed)
  Anesthesia Post-op Note  Patient: Lindsay Little  Procedure(s) Performed: Procedure(s) (LRB): XI ROBOTIC ASSISTED LOWER ANTERIOR RESECTION, , RIGID PROCTOSCOPY, RIGHT OOPHORECTOMY (N/A) INSERTION PORT-A-CATH (N/A)  Patient Location: PACU  Anesthesia Type: General  Level of Consciousness: awake and alert   Airway and Oxygen Therapy: Patient Spontanous Breathing  Post-op Pain: mild  Post-op Assessment: Post-op Vital signs reviewed, Patient's Cardiovascular Status Stable, Respiratory Function Stable, Patent Airway and No signs of Nausea or vomiting  Last Vitals:  Filed Vitals:   05/25/15 1805  BP: 120/68  Pulse: 94  Temp: 37 C  Resp: 18    Post-op Vital Signs: stable   Complications: No apparent anesthesia complications

## 2015-05-25 NOTE — Transfer of Care (Signed)
Immediate Anesthesia Transfer of Care Note  Patient: Lindsay Little  Procedure(s) Performed: Procedure(s): XI ROBOTIC ASSISTED LOWER ANTERIOR RESECTION, , RIGID PROCTOSCOPY, RIGHT OOPHORECTOMY (N/A) INSERTION PORT-A-CATH (N/A)  Patient Location: PACU  Anesthesia Type:General  Level of Consciousness: awake, alert , oriented and patient cooperative  Airway & Oxygen Therapy: Patient Spontanous Breathing and Patient connected to face mask oxygen  Post-op Assessment: Report given to RN and Post -op Vital signs reviewed and stable  Post vital signs: Reviewed and stable  Last Vitals: There were no vitals filed for this visit.  Complications: No apparent anesthesia complications

## 2015-05-25 NOTE — Discharge Instructions (Signed)
SURGERY: POST OP INSTRUCTIONS (Surgery for small bowel obstruction, colon resection, etc)   1. DIET: Follow a light bland diet the first 24 hours after arrival home, such as soup, liquids, crackers, etc.  Be sure to include lots of fluids daily.  Avoid fast food or heavy meals as your are more likely to get nauseated.  Stay on a low fat diet the next few days after surgery.  Gradually add a fiber supplement to your diet over the next week.   Your should try to eat a low-fat, high fiber diet the rest of your life thereafter (See Below).   2. Take your usually prescribed home medications unless otherwise directed.  OK to take aspirin.    If you are on strong blood thinners (warfarin/Coumadin, Plavix, Xerelto, Eliquis, etc), discuss with your surgeon, medicine PCP, and/or cardiologist for instructions on when to restart the blood thinner & if blood monitoring is needed (PT/INR blood check, etc).  Usually you can restart any strong blood thinners after the second postoperative day.  3. PAIN CONTROL:  Pain after surgery or related to activity is often due to strain/injury to muscle, tendon, nerves and/or incisions.  This pain is usually short-term and will improve in a few months.   Many people find it helpful to do the following things TOGETHER to help speed the process of healing and to get back to regular activity more quickly:  1. Avoid heavy physical activity at first a. No lifting greater than 20 pounds at first, then increase to lifting as tolerated over the next few weeks b. Do not push through the pain.  Listen to your body and avoid positions and maneuvers than reproduce the pain.  Wait a few days before trying something more intense c. Walking is okay as tolerated, but go slowly and stop when getting sore.  If you can walk 30 minutes without stopping or pain, you can try more intense activity (running, jogging, aerobics, cycling, swimming, treadmill, sex, sports, weightlifting, etc  ) d. Remember: If it hurts to do it, then dont do it!  2. Take Anti-inflammatory medication a. Choose ONE of the following over-the-counter medications: i.            Acetaminophen 52m tabs (Tylenol) 1-2 pills with every meal and just before bedtime (avoid if you have liver problems) ii.            Naproxen 228mtabs (ex. Aleve) 1-2 pills twice a day (avoid if you have kidney, stomach, IBD, or bleeding problems) iii. Ibuprofen 20050mabs (ex. Advil, Motrin) 3-4 pills with every meal and just before bedtime (avoid if you have kidney, stomach, IBD, or bleeding problems) b. Take with food/snack around the clock for 1-2 weeks i. This helps the muscle and nerve tissues become less irritable and calm down faster  3. Use a Heating pad or Ice/Cold Pack a. Most patients will experience some swelling and bruising around the incisions.  Swelling and bruising can take several weeks to resolve. i. Ice packs or heating pads (30-60 minutes up to 6 times a day) will help. ii. Use ice for the first few days to help decrease swelling and bruising iii. Switch to heat to help relax tight/sore spots and speed recovery.  iv. Some people prefer to use ice alone, heat alone, alternating between ice & heat.  Experiment to what works for you a. May use warm bath/hottub  or showers  4. Try Gentle Massage and/or Stretching  a. at the area of  pain many times a day b. stop if you feel pain - do not overdo it 5. Prescription for pain medication (such as oxycodone, hydrocodone, etc) should be given to you upon discharge.  Take your pain medication as prescribed. a. If you are having problems/concerns with the prescription medicine (does not control pain, nausea, vomiting, rash, itching, etc), please call us [phone] to see if we need to switch you to a different pain medicine that will work better for you and/or control your side effect better. b. If you need a refill on your pain medication, please contact your  pharmacy.  They will contact our office to request authorization. Prescriptions will not be filled after 5 pm or on week-ends. c.  Try these steps together to help you body heal faster and avoid making things get worse.  Doing just one of these things may not be enough.    If you are not getting better after two weeks or are noticing you are getting worse, contact our office for further advice; we may need to re-evaluate you & see what other things we can do to help.   GETTING TO GOOD BOWEL HEALTH. Irregular bowel habits such as constipation and diarrhea can lead to many problems over time.  Having one soft bowel movement a day is the most important way to prevent further problems.  The anorectal canal is designed to handle stretching and feces to safely manage our ability to get rid of solid waste (feces, poop, stool) out of our body.  BUT, hard constipated stools can act like ripping concrete bricks and diarrhea can be a burning fire to this very sensitive area of our body, causing inflamed hemorrhoids, anal fissures, increasing risk is perirectal abscesses, abdominal pain/bloating, an making irritable bowel worse.      The goal: ONE SOFT BOWEL MOVEMENT A DAY!  To have soft, regular bowel movements:   Drink plenty of fluids, consider 4-6 tall glasses of water a day.    Take plenty of fiber.  Fiber is the undigested part of plant food that passes into the colon, acting s natures broom to encourage bowel motility and movement.  Fiber can absorb and hold large amounts of water. This results in a larger, bulkier stool, which is soft and easier to pass. Work gradually over several weeks up to 6 servings a day of fiber (25g a day even more if needed) in the form of: o Vegetables -- Root (potatoes, carrots, turnips), leafy green (lettuce, salad greens, celery, spinach), or cooked high residue (cabbage, broccoli, etc) o Fruit -- Fresh (unpeeled skin & pulp), Dried (prunes, apricots, cherries, etc ),  or  stewed ( applesauce)  o Whole grain breads, pasta, etc (whole wheat)  o Bran cereals   Bulking Agents -- This type of water-retaining fiber generally is easily obtained each day by one of the following:  o Psyllium bran -- The psyllium plant is remarkable because its ground seeds can retain so much water. This product is available as Metamucil, Konsyl, Effersyllium, Per Diem Fiber, or the less expensive generic preparation in drug and health food stores. Although labeled a laxative, it really is not a laxative.  o Methylcellulose -- This is another fiber derived from wood which also retains water. It is available as Citrucel. o Polyethylene Glycol - and artificial fiber commonly called Miralax or Glycolax.  It is helpful for people with gassy or bloated feelings with regular fiber o Flax Seed - a less gassy fiber than  psyllium  No reading or other relaxing activity while on the toilet. If bowel movements take longer than 5 minutes, you are too constipated  AVOID CONSTIPATION.  High fiber and water intake usually takes care of this.  Sometimes a laxative is needed to stimulate more frequent bowel movements, but   Laxatives are not a good long-term solution as it can wear the colon out.  They can help jump-start bowels if constipated, but should be relied on constantly without discussing with your doctor o Osmotics (Milk of Magnesia, Fleets phosphosoda, Magnesium citrate, MiraLax, GoLytely) are safer than  o Stimulants (Senokot, Castor Oil, Dulcolax, Ex Lax)    o Avoid taking laxatives for more than 7 days in a row.   IF SEVERELY CONSTIPATED, try a Bowel Retraining Program: o Do not use laxatives.  o Eat a diet high in roughage, such as bran cereals and leafy vegetables.  o Drink six (6) ounces of prune or apricot juice each morning.  o Eat two (2) large servings of stewed fruit each day.  o Take one (1) heaping tablespoon of a psyllium-based bulking agent twice a day. Use sugar-free  sweetener when possible to avoid excessive calories.  o Eat a normal breakfast.  o Set aside 15 minutes after breakfast to sit on the toilet, but do not strain to have a bowel movement.  o If you do not have a bowel movement by the third day, use an enema and repeat the above steps.   Controlling diarrhea o Switch to liquids and simpler foods for a few days to avoid stressing your intestines further. o Avoid dairy products (especially milk & ice cream) for a short time.  The intestines often can lose the ability to digest lactose when stressed. o Avoid foods that cause gassiness or bloating.  Typical foods include beans and other legumes, cabbage, broccoli, and dairy foods.  Every person has some sensitivity to other foods, so listen to our body and avoid those foods that trigger problems for you. o Adding fiber (Citrucel, Metamucil, psyllium, Miralax) gradually can help thicken stools by absorbing excess fluid and retrain the intestines to act more normally.  Slowly increase the dose over a few weeks.  Too much fiber too soon can backfire and cause cramping & bloating. o Probiotics (such as active yogurt, Align, etc) may help repopulate the intestines and colon with normal bacteria and calm down a sensitive digestive tract.  Most studies show it to be of mild help, though, and such products can be costly. o Medicines: - Bismuth subsalicylate (ex. Kayopectate, Pepto Bismol) every 30 minutes for up to 6 doses can help control diarrhea.  Avoid if pregnant. - Loperamide (Immodium) can slow down diarrhea.  Start with two tablets (88m total) first and then try one tablet every 6 hours.  Avoid if you are having fevers or severe pain.  If you are not better or start feeling worse, stop all medicines and call your doctor for advice o Call your doctor if you are getting worse or not better.  Sometimes further testing (cultures, endoscopy, X-ray studies, bloodwork, etc) may be needed to help diagnose and treat  the cause of the diarrhea.  TROUBLESHOOTING IRREGULAR BOWELS 1) Avoid extremes of bowel movements (no bad constipation/diarrhea) 2) Miralax 17gm mixed in 8oz. water or juice-daily. May use BID as needed.  3) Gas-x,Phazyme, etc. as needed for gas & bloating.  4) Soft,bland diet. No spicy,greasy,fried foods.  5) Prilosec over-the-counter as needed  6) May hold  gluten/wheat products from diet to see if symptoms improve.  7)  May try probiotics (Align, Activa, etc) to help calm the bowels down 7) If symptoms become worse call back immediately.   4. Wash / shower every day.  You may shower over the incision / wound.  Avoid baths until 5 days after surgery.  Continue to shower over incision(s) after the dressing is off.  5. Remove your waterproof bandages 5 days after surgery.  You may leave the incision open to air.  Remove any wicks or ribbons in your wound.  If you have an open wound, please see wound care instructions. You may replace a dressing/Band-Aid to cover the incision for comfort if you wish.  6. ACTIVITIES as tolerated:   a. You may resume regular (light) daily activities beginning the next day--such as daily self-care, walking, climbing stairs--gradually increasing activities as tolerated.  If you can walk 30 minutes without difficulty, it is safe to try more intense activity such as jogging, treadmill, bicycling, low-impact aerobics, swimming, etc. b. Save the most intensive and strenuous activity for last (Usually 3-6 weeks after surgery) such as sit-ups, heavy lifting, contact sports, etc  Refrain from any heavy lifting or straining until you are off narcotics for pain control.   c. DO NOT PUSH THROUGH PAIN.  Let pain be your guide: If it hurts to do something, don't do it.  Pain is your body warning you to avoid that activity for another week until the pain goes down. d. You may drive when you are no longer taking prescription pain medication, you can comfortably wear a seatbelt, and  you can safely maneuver your car and apply brakes. e. Dennis Bast may have sexual intercourse when it is comfortable. If it hurts to do something, don't do it.  7. FOLLOW UP in our office a. Please call [facility] at [phone] to set up an appointment to see your surgeon in the office for a follow-up appointment approximately 2-3 weeks after your surgery. b. Make sure that you call for this appointment the day you arrive home to insure a convenient appointment time.  8. IF YOU HAVE DISABILITY OR FAMILY LEAVE FORMS, BRING THEM TO THE OFFICE FOR PROCESSING.  DO NOT GIVE THEM TO YOUR DOCTOR.   WHEN TO CALL us [phone]: 1. Poor pain control 2. Reactions / problems with new medications (rash/itching, nausea, etc)  3. Fever over 101.5 F (38.5 C) 4. Inability to urinate 5. Nausea and/or vomiting 6. Worsening swelling or bruising 7. Continued bleeding from incision. 8. Increased pain, redness, or drainage from the incision  The clinic staff is available to answer your questions during regular business hours (8:30am-5pm).  Please dont hesitate to call and ask to speak to one of our nurses for clinical concerns.   A surgeon from [facility] is always on call at the hospitals   If you have a medical emergency, go to the nearest emergency room or call 911.    [facility], [address], [place], [place], [place]  [address] ? MAIN: [phone] ? TOLL FREE: [phone] ? FAX [phone] [email or web address]   Managing Pain  Pain after surgery or related to activity is often due to strain/injury to muscle, tendon, nerves and/or incisions.  This pain is usually short-term and will improve in a few months.   Many people find it helpful to do the following things TOGETHER to help speed the process of healing and to get back  to regular activity more quickly:  6. Avoid heavy physical activity at first a. No lifting greater than 20 pounds at  first, then increase to lifting as tolerated over the next few weeks b. Do not push through the pain.  Listen to your body and avoid positions and maneuvers than reproduce the pain.  Wait a few days before trying something more intense c. Walking is okay as tolerated, but go slowly and stop when getting sore.  If you can walk 30 minutes without stopping or pain, you can try more intense activity (running, jogging, aerobics, cycling, swimming, treadmill, sex, sports, weightlifting, etc ) d. Remember: If it hurts to do it, then dont do it!  7. Take Acetaminophen Anti-inflammatory medication i. Acetaminophen 510m tabs (Tylenol) 1-2 pills with every meal and just before bedtime (avoid if you have liver problems) ii. Take with food/snack around the clock for 1-2 weeks iii. This helps the muscle and nerve tissues become less irritable and calm down faster  8. Use a Heating pad or Ice/Cold Pack a. 4-6 times a day b. May use warm bath/hottub  or showers  9. Try Gentle Massage and/or Stretching  a. at the area of pain many times a day b. stop if you feel pain - do not overdo it  Try these steps together to help you body heal faster and avoid making things get worse.  Doing just one of these things may not be enough.    If you are not getting better after two weeks or are noticing you are getting worse, contact our office for further advice; we may need to re-evaluate you & see what other things we can do to help.  GETTING TO GOOD BOWEL HEALTH. Irregular bowel habits such as constipation and diarrhea can lead to many problems over time.  Having one soft bowel movement a day is the most important way to prevent further problems.  The anorectal canal is designed to handle stretching and feces to safely manage our ability to get rid of solid waste (feces, poop, stool) out of our body.  BUT, hard constipated stools can act like ripping concrete bricks and diarrhea can be a burning fire to this very  sensitive area of our body, causing inflamed hemorrhoids, anal fissures, increasing risk is perirectal abscesses, abdominal pain/bloating, an making irritable bowel worse.      The goal: ONE SOFT BOWEL MOVEMENT A DAY!  To have soft, regular bowel movements:   Drink plenty of fluids, consider 4-6 tall glasses of water a day.    Take plenty of fiber.  Fiber is the undigested part of plant food that passes into the colon, acting s natures broom to encourage bowel motility and movement.  Fiber can absorb and hold large amounts of water. This results in a larger, bulkier stool, which is soft and easier to pass. Work gradually over several weeks up to 6 servings a day of fiber (25g a day even more if needed) in the form of: o Vegetables -- Root (potatoes, carrots, turnips), leafy green (lettuce, salad greens, celery, spinach), or cooked high residue (cabbage, broccoli, etc) o Fruit -- Fresh (unpeeled skin & pulp), Dried (prunes, apricots, cherries, etc ),  or stewed ( applesauce)  o Whole grain breads, pasta, etc (whole wheat)  o Bran cereals   Bulking Agents -- This type of water-retaining fiber generally is easily obtained each day by one of the following:  o Psyllium bran -- The psyllium plant is remarkable because its  ground seeds can retain so much water. This product is available as Metamucil, Konsyl, Effersyllium, Per Diem Fiber, or the less expensive generic preparation in drug and health food stores. Although labeled a laxative, it really is not a laxative.  o Methylcellulose -- This is another fiber derived from wood which also retains water. It is available as Citrucel. o Polyethylene Glycol - and artificial fiber commonly called Miralax or Glycolax.  It is helpful for people with gassy or bloated feelings with regular fiber o Flax Seed - a less gassy fiber than psyllium  No reading or other relaxing activity while on the toilet. If bowel movements take longer than 5 minutes, you are too  constipated  AVOID CONSTIPATION.  High fiber and water intake usually takes care of this.  Sometimes a laxative is needed to stimulate more frequent bowel movements, but   Laxatives are not a good long-term solution as it can wear the colon out.  They can help jump-start bowels if constipated, but should be relied on constantly without discussing with your doctor o Osmotics (Milk of Magnesia, Fleets phosphosoda, Magnesium citrate, MiraLax, GoLytely) are safer than  o Stimulants (Senokot, Castor Oil, Dulcolax, Ex Lax)    o Avoid taking laxatives for more than 7 days in a row.   IF SEVERELY CONSTIPATED, try a Bowel Retraining Program: o Do not use laxatives.  o Eat a diet high in roughage, such as bran cereals and leafy vegetables.  o Drink six (6) ounces of prune or apricot juice each morning.  o Eat two (2) large servings of stewed fruit each day.  o Take one (1) heaping tablespoon of a psyllium-based bulking agent twice a day. Use sugar-free sweetener when possible to avoid excessive calories.  o Eat a normal breakfast.  o Set aside 15 minutes after breakfast to sit on the toilet, but do not strain to have a bowel movement.  o If you do not have a bowel movement by the third day, use an enema and repeat the above steps.   Controlling diarrhea o Switch to liquids and simpler foods for a few days to avoid stressing your intestines further. o Avoid dairy products (especially milk & ice cream) for a short time.  The intestines often can lose the ability to digest lactose when stressed. o Avoid foods that cause gassiness or bloating.  Typical foods include beans and other legumes, cabbage, broccoli, and dairy foods.  Every person has some sensitivity to other foods, so listen to our body and avoid those foods that trigger problems for you. o Adding fiber (Citrucel, Metamucil, psyllium, Miralax) gradually can help thicken stools by absorbing excess fluid and retrain the intestines to act more  normally.  Slowly increase the dose over a few weeks.  Too much fiber too soon can backfire and cause cramping & bloating. o Probiotics (such as active yogurt, Align, etc) may help repopulate the intestines and colon with normal bacteria and calm down a sensitive digestive tract.  Most studies show it to be of mild help, though, and such products can be costly. o Medicines: - Bismuth subsalicylate (ex. Kayopectate, Pepto Bismol) every 30 minutes for up to 6 doses can help control diarrhea.  Avoid if pregnant. - Loperamide (Immodium) can slow down diarrhea.  Start with two tablets (40m total) first and then try one tablet every 6 hours.  Avoid if you are having fevers or severe pain.  If you are not better or start feeling worse, stop all medicines  and call your doctor for advice o Call your doctor if you are getting worse or not better.  Sometimes further testing (cultures, endoscopy, X-ray studies, bloodwork, etc) may be needed to help diagnose and treat the cause of the diarrhea.  TROUBLESHOOTING IRREGULAR BOWELS 1) Avoid extremes of bowel movements (no bad constipation/diarrhea) 2) Miralax 17gm mixed in 8oz. water or juice-daily. May use BID as needed.  3) Gas-x,Phazyme, etc. as needed for gas & bloating.  4) Soft,bland diet. No spicy,greasy,fried foods.  5) Prilosec over-the-counter as needed  6) May hold gluten/wheat products from diet to see if symptoms improve.  7)  May try probiotics (Align, Activa, etc) to help calm the bowels down 7) If symptoms become worse call back immediately.  Pelvic floor muscle training exercises ("Kegels") can help strengthen the muscles under the uterus, bladder, and bowel (large intestine). They can help both men and women who have problems with urine leakage or bowel control.  A pelvic floor muscle training exercise is like pretending that you have to urinate, and then holding it. You relax and tighten the muscles that control urine flow. It's important to  find the right muscles to tighten.  The next time you have to urinate, start to go and then stop. Feel the muscles in your vagina, bladder, or anus get tight and move up. These are the pelvic floor muscles. If you feel them tighten, you've done the exercise right. If you are still not sure whether you are tightening the right muscles, keep in mind that all of the muscles of the pelvic floor relax and contract at the same time. Because these muscles control the bladder, rectum, and vagina, the following tips may help: Women: Insert a finger into your vagina. Tighten the muscles as if you are holding in your urine, then let go. You should feel the muscles tighten and move up and down.  Men: Insert a finger into your rectum. Tighten the muscles as if you are holding in your urine, then let go. You should feel the muscles tighten and move up and down. These are the same muscles you would tighten if you were trying to prevent yourself from passing gas.  It is very important that you keep the following muscles relaxed while doing pelvic floor muscle training exercises: Abdominal  Buttocks (the deeper, anal sphincter muscle should contract)  Thigh   A woman can also strengthen these muscles by using a vaginal cone, which is a weighted device that is inserted into the vagina. Then you try to tighten the pelvic floor muscles to hold the device in place. If you are unsure whether you are doing the pelvic floor muscle training correctly, you can use biofeedback and electrical stimulation to help find the correct muscle group to work. Biofeedback is a method of positive reinforcement. Electrodes are placed on the abdomen and along the anal area. Some therapists place a sensor in the vagina in women or anus in men to monitor the contraction of pelvic floor muscles.  A monitor will display a graph showing which muscles are contracting and which are at rest. The therapist can help find the right muscles for  performing pelvic floor muscle training exercises.   PERFORMING PELVIC FLOOR EXERCISES: 1. Begin by emptying your bladder. 2. Tighten the pelvic floor muscles and hold for a count of 10. 3. Relax the muscles completely for a count of 10. 4. Do 10 repititions, 3 to 5 times a day (morning, afternoon, and night). You can  do these exercises at any time and any place. Most people prefer to do the exercises while lying down or sitting in a chair. After 4 - 6 weeks, most people notice some improvement. It may take as long as 3 months to see a major change. After a couple of weeks, you can also try doing a single pelvic floor contraction at times when you are likely to leak (for example, while getting out of a chair). A word of caution: Some people feel that they can speed up the progress by increasing the number of repetitions and the frequency of exercises. However, over-exercising can instead cause muscle fatigue and increase urine leakage. If you feel any discomfort in your abdomen or back while doing these exercises, you are probably doing them wrong. Breathe deeply and relax your body when you are doing these exercises. Make sure you are not tightening your stomach, thigh, buttock, or chest muscles. When done the right way, pelvic floor muscle exercises have been shown to be very effective at improving urinary continence.  Pelvic Floor Pain / Incontinence  Do you suffer from pelvic pain or incontinence? Do you have pain in the pelvis, low back or hips that is associated with sitting, walking, urination or intercourse? Have you experienced leaking of urine or feces when coughing, sneezing or laughing? Do you have pain in the pelvic area associated with cancer?  These are conditions that are common with pelvic floor muscle dysfunction. Over time, due to stress, scar tissue, surgeries and the natural course of aging, our muscles may become weak or overstressed and can spasm. This can lead to pain,  weakness, incontinence or decreased quality of life.  Men and women with pelvic floor dysfunction frequently describe:  A falling out feeling. Pain or burning in the abdomen, tailbone or perineal area. Constipation or bowel elimination problems or difficulty initiating urination. Unresolved low back or hip pain. Frequency and urgency when going to the bathroom. Leaking of urine or feces. Pain with intercourse.  https://cherry.com/  To make a referral or for more information about [facility] Pelvic Floor Therapy Program, call  [facility]) - [phone] Linna Hoff ([place]) - [phone] [facility]) [phone]  Colorectal Cancer Colorectal cancer is an abnormal growth of tissue (tumor) in the colon or rectum that is cancerous (malignant). Unlike noncancerous (benign) tumors, malignant tumors can spread to other parts of your body. The colon is the large bowel or large intestine. The rectum is the last several inches of the colon.  RISK FACTORS The exact cause of colorectal cancer is unknown. However, the following factors may increase your chances of getting colorectal cancer:   Age older than 14 years.   Abnormal growths (polyps) on the inner wall of the colon or rectum.   Diabetes.   African American race.   Family history of hereditary nonpolyposis colorectal cancer. This condition is caused by changes in the genes that are responsible for repairing mismatched DNA.   Personal history of cancer. A person who has already had colorectal cancer may develop it a second time. Also, women with a history of ovarian, uterine, or breast cancer are at a somewhat higher risk of developing colorectal cancer.  Certain hereditary conditions.  Eating a diet that is high in fat (especially animal fat) and low in fiber, fruits,  and vegetables.  Sedentary lifestyle.  Inflammatory bowel disease, including ulcerative colitis and Crohn's disease.   Smoking.   Excessive alcohol use.  SYMPTOMS Early colorectal cancer often  does not cause symptoms. As the cancer grows, symptoms may include:   Changes in bowel habits.  Diarrhea.   Constipation.   Feeling like the bowel does not empty completely after a bowel movement.   Blood in the stool.   Stools that are narrower than usual.   Abdominal discomfort, pain, bloating, fullness, or cramps.  Frequent gas pain.   Unexplained weight loss.   Constant tiredness.   Nausea and vomiting.  DIAGNOSIS  Your health care provider will ask about your medical history. He or she may also perform a number of procedures, such as:   A physical exam.  A digital rectal exam.  A fecal occult blood test.  A barium enema.  Blood tests.   X-rays.   Imaging tests, such as CT scans or MRIs.   Taking a tissue sample (biopsy) from your colon or rectum to look for cancer cells.   A sigmoidoscopy to view the inside of the last part of your colon.   A colonoscopy to view the inside of your entire colon.   An endorectal ultrasound to see how deep a rectal tumor has grown and whether the cancer has spread to lymph nodes or other nearby tissues.  Your cancer will be staged to determine its severity and extent. Staging is a careful attempt to find out the size of the tumor, whether the cancer has spread, and if so, to what parts of the body. You may need to have more tests to determine the stage of your cancer. The test results will help determine what treatment plan is best for you.   Stage 0. The cancer is found only in the innermost lining of the colon or rectum.   Stage I. The cancer has grown into the inner wall of the colon or rectum. The cancer has not yet reached the outer wall of the colon.   Stage II. The cancer extends more deeply into or  through the wall of the colon or rectum. It may have invaded nearby tissue, but cancer cells have not spread to the lymph nodes.   Stage III. The cancer has spread to nearby lymph nodes but not to other parts of the body.   Stage IV. The cancer has spread to other parts of the body, such as the liver or lungs.  Your health care provider may tell you the detailed stage of your cancer, which includes both a number and a letter.  TREATMENT  Depending on the type and stage, colorectal cancer may be treated with surgery, radiation therapy, chemotherapy, targeted therapy, or radiofrequency ablation. Some people have a combination of these therapies. Surgery may be done to remove the polyps from your colon. In early stages, your health care provider may be able to do this during a colonoscopy. In later stages, surgery may be done to remove part of your colon.  HOME CARE INSTRUCTIONS   Take medicines only as directed by your health care provider.   Maintain a healthy diet.   Consider joining a support group. This may help you learn to cope with the stress of having colorectal cancer.   Seek advice to help you manage treatment of side effects.   Keep all follow-up visits as directed by your health care provider.   Inform your cancer specialist if you are admitted to the hospital.  SEEK MEDICAL CARE IF:  Your diarrhea or constipation does not go away.   Your bowel habits change.  You have increased abdominal pain.  You notice new fatigue or weakness.  You lose weight.   This information is not intended to replace advice given to you by your health care provider. Make sure you discuss any questions you have with your health care provider.   Document Released: 07/01/2005 Document Revised: 07/22/2014 Document Reviewed: 12/24/2012 Elsevier Interactive Patient Education [employer].

## 2015-05-25 NOTE — Interval H&P Note (Signed)
History and Physical Interval Note:  05/25/2015 7:08 AM  Lindsay Little  has presented today for surgery, with the diagnosis of Proximal Rectal Cancer  The various methods of treatment have been discussed with the patient and family. After consideration of risks, benefits and other options for treatment, the patient has consented to  Procedure(s): XI ROBOTIC ASSISTED LOWER ANTERIOR RESECTION, POSSIBLE OSTOMY, RIGID PROCTOSCOPY (N/A) INSERTION PORT-A-CATH (N/A) as a surgical intervention .  The patient's history has been reviewed, patient examined, no change in status, stable for surgery.  I have reviewed the patient's chart and labs.  Questions were answered to the patient's satisfaction.     [name]

## 2015-05-26 LAB — CBC
HEMATOCRIT: 31.7 % — AB (ref 36.0–46.0)
Hemoglobin: 10 g/dL — ABNORMAL LOW (ref 12.0–15.0)
MCH: 27.4 pg (ref 26.0–34.0)
MCHC: 31.5 g/dL (ref 30.0–36.0)
MCV: 86.8 fL (ref 78.0–100.0)
Platelets: 272 10*3/uL (ref 150–400)
RBC: 3.65 MIL/uL — ABNORMAL LOW (ref 3.87–5.11)
RDW: 17.3 % — AB (ref 11.5–15.5)
WBC: 11.5 10*3/uL — ABNORMAL HIGH (ref 4.0–10.5)

## 2015-05-26 LAB — BASIC METABOLIC PANEL
Anion gap: 4 — ABNORMAL LOW (ref 5–15)
BUN: 10 mg/dL (ref 6–20)
CHLORIDE: 106 mmol/L (ref 101–111)
CO2: 27 mmol/L (ref 22–32)
Calcium: 9.1 mg/dL (ref 8.9–10.3)
Creatinine, Ser: 0.6 mg/dL (ref 0.44–1.00)
GFR calc Af Amer: >60 mL/min (ref >60)
GFR calc non Af Amer: >60 mL/min (ref >60)
GLUCOSE: 115 mg/dL — AB (ref 65–99)
POTASSIUM: 4.3 mmol/L (ref 3.5–5.1)
Sodium: 137 mmol/L (ref 135–145)

## 2015-05-26 LAB — MAGNESIUM: Magnesium: 1.7 mg/dL (ref 1.7–2.4)

## 2015-05-26 MED ORDER — LACTATED RINGERS IV BOLUS (SEPSIS): 1000.0000 mL | Freq: Three times a day (TID) | INTRAVENOUS | Status: AC | PRN

## 2015-05-26 MED ORDER — METHOCARBAMOL 1000 MG/10ML IJ SOLN
1000.0000 mg | Freq: Four times a day (QID) | INTRAVENOUS | Status: DC | PRN
  Filled 2015-05-26: qty 10

## 2015-05-26 MED ORDER — SODIUM CHLORIDE 0.9 % IJ SOLN
3.0000 mL | Freq: Two times a day (BID) | INTRAMUSCULAR | Status: DC
  Administered 2015-05-26 – 2015-05-28 (×6): 3 mL via INTRAVENOUS

## 2015-05-26 MED ORDER — ENSURE ENLIVE PO LIQD: 237.0000 mL | Freq: Two times a day (BID) | ORAL | Status: DC

## 2015-05-26 MED ORDER — SODIUM CHLORIDE 0.9 % IJ SOLN: 3.0000 mL | INTRAMUSCULAR | Status: DC | PRN

## 2015-05-26 MED ORDER — HYDROMORPHONE HCL 1 MG/ML IJ SOLN
1.0000 mg | INTRAMUSCULAR | Status: DC | PRN
  Administered 2015-05-26 – 2015-05-28 (×13): 1 mg via INTRAVENOUS
  Filled 2015-05-26 (×13): qty 1

## 2015-05-26 MED ORDER — VITAMIN C 500 MG PO TABS
500.0000 mg | ORAL_TABLET | Freq: Two times a day (BID) | ORAL | Status: DC
  Administered 2015-05-26 – 2015-05-29 (×7): 500 mg via ORAL
  Filled 2015-05-26 (×9): qty 1

## 2015-05-26 MED ORDER — SODIUM CHLORIDE 0.9 % IV SOLN: 250.0000 mL | INTRAVENOUS | Status: DC | PRN

## 2015-05-26 NOTE — Progress Notes (Signed)
[facility]  [address] [place], [place] [address] Phone: [phone] FAX: [phone]   AUNDRA PUNG [id number] Aug 25, 1965   Problem List:   Principal Problem:   Rectal adenocarcinoma [facility]) Active Problems:   Morbid obesity with BMI of 50.0-59.9, adult (HCC)   Crohn's disease ([place])   Anemia   Hypertension   IBS (irritable bowel syndrome)   Depression   Rectal cancer ([place])   1 Day Post-Op  05/25/2015  PRE-OPERATIVE DIAGNOSIS: Proximal/Mid Rectal Cancer  POST-OPERATIVE DIAGNOSIS: Proximal/Mid Rectal Cancer  PROCEDURE:  XI ROBOTIC ASSISTED RECTOSIGMOID LOWER ANTERIOR RESECTION Laparoscopic and robotic lysis of adhesions X 60 MINUTES RIGID PROCTOSCOPY RIGHT SALPINGO-OOPHORECTOMY INSERTION PORT-A-CATH  Surgeon(s): Michael [place], MD Leighton Ruff, MD - assist    Assessment  Stable  Plan:  -[place] gradually per protocol -Wean IVF - IVF bolus backup -HTN - 1/2 dose for now w IV backup -depression control -VTE prophylaxis- SCDs, enoxaparin -mobilize as tolerated to help recovery.  GET HER UP!!  Pain control to help that  [name], M.D., F.A.C.S. Gastrointestinal and Minimally Invasive Surgery [facility] [address] [place], [place] [address] [phone] Main / Paging   05/26/2015  Subjective:  Sore - Dilaudid helps Husband in room  Objective:  Vital signs:  Filed Vitals:   05/25/15 2133 05/26/15 0121 05/26/15 0533 05/26/15 0633  BP: 121/49 105/69 111/44   Pulse: 87 93 75   Temp: 98.2 F (36.8 C) 98.2 F (36.8 C) 97.9 F (36.6 C)   TempSrc: Oral Oral Oral   Resp: _0 Height:      Weight:    152.953 kg (337 lb 3.2 oz)  SpO2: 96% 91% 97%        Intake/Output   Yesterday:  11/10 0701 - 11/11 0700 In: 5017.5 [I.V.:5017.5] Out: 2985 [Urine:2125; Drains:460; Blood:400] This shift:  Total  I/O In: 300 [I.V.:300] Out: 1920 [Urine:1700; Drains:220]  Bowel function:  Flatus: n  BM: n  Drain: serosanguinous  Physical Exam:  General: Pt awake/alert/oriented x4 in no acute distress.  Tired but not toxic Eyes: PERRL, normal EOM.  Sclera clear.  No icterus Neuro: CN II-XII intact w/o focal sensory/motor deficits. Lymph: No head/neck/groin lymphadenopathy Psych:  No delerium/psychosis/paranoia HENT: Normocephalic, Mucus membranes moist.  No thrush Neck: Supple, No tracheal deviation Chest: No chest wall pain w good excursion CV:  Pulses intact.  Regular rhythm MS: Normal AROM mjr joints.  No obvious deformity Abdomen: Soft.  Nondistended.  Mildly tender at incisions only.  No evidence of peritonitis.  No incarcerated hernias. Ext:  SCDs BLE.  No mjr edema.  No cyanosis Skin: No petechiae / purpura  Results:   Labs: Results for orders placed or performed during the hospital encounter of 05/25/15 (from the past 48 hour(s))  Basic metabolic panel     Status: Abnormal   Collection Time: 05/26/15  5:52 AM  Result Value Ref Range   Sodium 137 135 - 145 mmol/L   Potassium 4.3 3.5 - 5.1 mmol/L   Chloride 106 101 - 111 mmol/L   CO2 27 22 - 32 mmol/L   Glucose, Bld 115 (H) 65 - 99 mg/dL   BUN 10 6 - 20 mg/dL   Creatinine, Ser 0.60 0.44 - 1.00 mg/dL   Calcium 9.1 8.9 - 10.3 mg/dL   GFR calc non Af Amer >60 >60 mL/min   GFR calc Af Amer >60 >  60 mL/min    Comment: (NOTE) The eGFR has been calculated using the CKD EPI equation. This calculation has not been validated in all clinical situations. eGFR's persistently <60 mL/min signify possible Chronic Kidney Disease.    Anion gap 4 (L) 5 - 15  CBC     Status: Abnormal   Collection Time: 05/26/15  5:52 AM  Result Value Ref Range   WBC 11.5 (H) 4.0 - 10.5 K/uL   RBC 3.65 (L) 3.87 - 5.11 MIL/uL   Hemoglobin 10.0 (L) 12.0 - 15.0 g/dL   HCT 31.7 (L) 36.0 - 46.0 %   MCV 86.8 78.0 - 100.0 fL   MCH 27.4 26.0 - 34.0 pg   MCHC  31.5 30.0 - 36.0 g/dL   RDW 17.3 (H) 11.5 - 15.5 %   Platelets 272 150 - 400 K/uL  Magnesium     Status: None   Collection Time: 05/26/15  5:52 AM  Result Value Ref Range   Magnesium 1.7 1.7 - 2.4 mg/dL    Imaging / Studies: Dg Chest Port 1 View  05/25/2015  CLINICAL DATA:  Status post Port-A-Cath placement EXAM: PORTABLE CHEST 1 VIEW COMPARISON:  CT chest dated 02/07/2015 FINDINGS: Right chest port terminates at the cavoatrial junction. Low lung volumes. Bilateral upper lobe and left lower lobe opacities, possibly atelectasis. No pleural effusion or pneumothorax. The heart is top-normal in size for inspiration. IMPRESSION: Right chest port terminates at the cavoatrial junction. Electronically Signed   By: Julian Hy M.D.   On: 05/25/2015 15:34   Dg C-arm 1-60 Min-no Report  05/25/2015  CLINICAL DATA: surg C-ARM 1-60 MINUTES Fluoroscopy was utilized by the requesting physician.  No radiographic interpretation.    Medications / Allergies: per chart  Antibiotics: Anti-infectives    Start     Dose/Rate Route Frequency Ordered Stop   05/25/15 2200  clindamycin (CLEOCIN) IVPB 900 mg     900 mg 100 mL/hr over 30 Minutes Intravenous 3 times per day 05/25/15 1609 05/25/15 2208   05/25/15 0905  clindamycin (CLEOCIN) 900 mg, gentamicin (GARAMYCIN) 240 mg in sodium chloride 0.9 % 1,000 mL for intraperitoneal lavage  Status:  Discontinued       As needed 05/25/15 0905 05/25/15 1435   05/25/15 0600  clindamycin (CLEOCIN) IVPB 900 mg     900 mg 100 mL/hr over 30 Minutes Intravenous 60 min pre-op 05/24/15 1351 05/25/15 1403   05/25/15 0600  gentamicin (GARAMYCIN) 520 mg in dextrose 5 % 100 mL IVPB     5 mg/kg  103.9 kg (Adjusted) 113 mL/hr over 60 Minutes Intravenous 60 min pre-op 05/24/15 1351 05/25/15 0814   05/25/15 0600  clindamycin (CLEOCIN) 900 mg, gentamicin (GARAMYCIN) 240 mg in sodium chloride 0.9 % 1,000 mL for intraperitoneal lavage  Status:  Discontinued    Comments:   Pharmacy may adjust dosing strength, schedule, rate of infusion, etc as needed to optimize therapy    Intraperitoneal To Surgery 05/24/15 1351 05/25/15 1602        Note: Portions of this report may have been transcribed using voice recognition software. Every effort was made to ensure accuracy; however, inadvertent computerized transcription errors may be present.   Any transcriptional errors that result from this process are unintentional.     [name], M.D., F.A.C.S. Gastrointestinal and Minimally Invasive Surgery [facility] [address], [place], [place] [address] [phone] Main / Paging   05/26/2015  CARE TEAM:  PCP: Vidal Schwalbe, MD  Outpatient Care Team: Patient Care Team: [name], MD as PCP - General (Family Medicine) Michael [place], MD as Consulting Physician (General Surgery) Truitt Merle, MD as Consulting Physician (Medical Oncology) Kyung Rudd, MD as Consulting Physician (Radiation Oncology) Teena Irani, MD as Consulting Physician (Gastroenterology)  Inpatient Treatment Team: Treatment Team: Attending Provider: Michael [place], MD; Technician: Sueanne Margarita, NT; Technician: Coralie Carpen, NT

## 2015-05-27 NOTE — Progress Notes (Signed)
Pt stated that orthostatic vitals to her understanding were only suppose to be done once a day.  Pt stated that she did not understand why it should be done more than once and did not want orthostatic vitals taken again.

## 2015-05-27 NOTE — Progress Notes (Signed)
[name], female   DOB: 09-Jun-1966, 49 y.o.   MRN: [id number] [facility] Progress Note:   2 Days Post-Op  Subjective: Mental status is clear.  No complaints. Having flatus Objective: Vital signs in last 24 hours: Temp:  [97.5 F (36.4 C)-98.6 F (37 C)] 97.5 F (36.4 C) (11/12 0927) Pulse Rate:  [64-89] 89 (11/12 0927) Resp:  [16-20] 20 (11/12 0927) BP: (124-135)/(49-62) 126/50 mmHg (11/12 0927) SpO2:  [94 %-98 %] 97 % (11/12 0927) Weight:  [154.813 kg (341 lb 4.8 oz)] 154.813 kg (341 lb 4.8 oz) (11/12 0535)  Intake/Output from previous day: 11/11 0701 - 11/12 0700 In: 600 [P.O.:600] Out: 2310 [Urine:2100; Drains:210] Intake/Output this shift: Total I/O In: 360 [P.O.:360] Out: -   Physical Exam: Work of breathing is normal.  Incisions OK.    Lab Results:  Results for orders placed or performed during the hospital encounter of 05/25/15 (from the past 48 hour(s))  Basic metabolic panel     Status: Abnormal   Collection Time: 05/26/15  5:52 AM  Result Value Ref Range   Sodium 137 135 - 145 mmol/L   Potassium 4.3 3.5 - 5.1 mmol/L   Chloride 106 101 - 111 mmol/L   CO2 27 22 - 32 mmol/L   Glucose, Bld 115 (H) 65 - 99 mg/dL   BUN 10 6 - 20 mg/dL   Creatinine, Ser 0.60 0.44 - 1.00 mg/dL   Calcium 9.1 8.9 - 10.3 mg/dL   GFR calc non Af Amer >60 >60 mL/min   GFR calc Af Amer >60 >60 mL/min    Comment: (NOTE) The eGFR has been calculated using the CKD EPI equation. This calculation has not been validated in all clinical situations. eGFR's persistently <60 mL/min signify possible Chronic Kidney Disease.    Anion gap 4 (L) 5 - 15  CBC     Status: Abnormal   Collection Time: 05/26/15  5:52 AM  Result Value Ref Range   WBC 11.5 (H) 4.0 - 10.5 K/uL   RBC 3.65 (L) 3.87 - 5.11 MIL/uL   Hemoglobin 10.0 (L) 12.0 - 15.0 g/dL   HCT 31.7 (L) 36.0 - 46.0 %   MCV 86.8 78.0 - 100.0 fL   MCH 27.4 26.0 - 34.0 pg   MCHC 31.5 30.0 - 36.0 g/dL   RDW 17.3  (H) 11.5 - 15.5 %   Platelets 272 150 - 400 K/uL  Magnesium     Status: None   Collection Time: 05/26/15  5:52 AM  Result Value Ref Range   Magnesium 1.7 1.7 - 2.4 mg/dL    Radiology/Results: Dg Chest Port 1 View  05/25/2015  CLINICAL DATA:  Status post Port-A-Cath placement EXAM: PORTABLE CHEST 1 VIEW COMPARISON:  CT chest dated 02/07/2015 FINDINGS: Right chest port terminates at the cavoatrial junction. Low lung volumes. Bilateral upper lobe and left lower lobe opacities, possibly atelectasis. No pleural effusion or pneumothorax. The heart is top-normal in size for inspiration. IMPRESSION: Right chest port terminates at the cavoatrial junction. Electronically Signed   By: Julian Hy M.D.   On: 05/25/2015 15:34   Dg C-arm 1-60 Min-no Report  05/25/2015  CLINICAL DATA: surg C-ARM 1-60 MINUTES Fluoroscopy was utilized by the requesting physician.  No radiographic interpretation.    Anti-infectives: Anti-infectives    Start     Dose/Rate Route Frequency Ordered Stop   05/25/15 2200  clindamycin (CLEOCIN) IVPB 900 mg     900 mg 100 mL/hr over 30 Minutes  Intravenous 3 times per day 05/25/15 1609 05/25/15 2208   05/25/15 0905  clindamycin (CLEOCIN) 900 mg, gentamicin (GARAMYCIN) 240 mg in sodium chloride 0.9 % 1,000 mL for intraperitoneal lavage  Status:  Discontinued       As needed 05/25/15 0905 05/25/15 1435   05/25/15 0600  clindamycin (CLEOCIN) IVPB 900 mg     900 mg 100 mL/hr over 30 Minutes Intravenous 60 min pre-op 05/24/15 1351 05/25/15 1403   05/25/15 0600  gentamicin (GARAMYCIN) 520 mg in dextrose 5 % 100 mL IVPB     5 mg/kg  103.9 kg (Adjusted) 113 mL/hr over 60 Minutes Intravenous 60 min pre-op 05/24/15 1351 05/25/15 0814   05/25/15 0600  clindamycin (CLEOCIN) 900 mg, gentamicin (GARAMYCIN) 240 mg in sodium chloride 0.9 % 1,000 mL for intraperitoneal lavage  Status:  Discontinued    Comments:  Pharmacy may adjust dosing strength, schedule, rate of infusion, etc as  needed to optimize therapy    Intraperitoneal To Surgery 05/24/15 1351 05/25/15 1602      Assessment/Plan: Problem List: Patient Active Problem List   Diagnosis Date Noted  . Rectal cancer ([place]) 05/25/2015  . Hypertension   . IBS (irritable bowel syndrome)   . Depression   . Anemia 02/20/2015  . Cavernous hemangioma of liver - segments 5 & 7 02/07/2015  . Umbilical hernia 43/73/5789  . Osteoarthritis of left knee 02/07/2015  . Crohn's disease ([place]) 02/07/2015  . Rectal adenocarcinoma ([place]) 01/31/2015  . Pure hypercholesterolemia 02/21/2014  . Morbid obesity with BMI of 50.0-59.9, adult ([place]) 02/21/2014  . Family history of ischemic heart disease 02/21/2014    Doing well.  On liquids.  Will D/C Foley and encourage walking.   2 Days Post-Op    LOS: 2 days   Matt B. Hassell Done, MD, [facility] [phone] beeper [phone]  05/27/2015 9:31 AM

## 2015-05-28 NOTE — Progress Notes (Signed)
Orthostatics completed at [phone]. See flowsheet in EPIC.

## 2015-05-28 NOTE — Progress Notes (Signed)
[name], female   DOB: Aug 19, 1965, 49 y.o.   MRN: [id number] [facility] Progress Note:   3 Days Post-Op  Subjective: Mental status is clear.  Up in chair-itching plastic over incisions Objective: Vital signs in last 24 hours: Temp:  [97.5 F (36.4 C)-99 F (37.2 C)] 97.9 F (36.6 C) (11/13 0628) Pulse Rate:  [77-94] 77 (11/13 0628) Resp:  [18-20] 20 (11/13 0628) BP: (125-150)/(49-87) 150/87 mmHg (11/13 0628) SpO2:  [96 %-100 %] 97 % (11/13 0628) Weight:  [155.039 kg (341 lb 12.8 oz)] 155.039 kg (341 lb 12.8 oz) (11/13 0700)  Intake/Output from previous day: 11/12 0701 - 11/13 0700 In: 1440 [P.O.:1440] Out: 1027.5 [Urine:825; Drains:202.5] Intake/Output this shift:    Physical Exam: Work of breathing is normal. JP serosanguinous.  tagaderms removed.  Wicks removed from extraction site.  Wounds OK.    Lab Results:  No results found for this or any previous visit (from the past 48 hour(s)).  Radiology/Results: No results found.  Anti-infectives: Anti-infectives    Start     Dose/Rate Route Frequency Ordered Stop   05/25/15 2200  clindamycin (CLEOCIN) IVPB 900 mg     900 mg 100 mL/hr over 30 Minutes Intravenous 3 times per day 05/25/15 1609 05/25/15 2208   05/25/15 0905  clindamycin (CLEOCIN) 900 mg, gentamicin (GARAMYCIN) 240 mg in sodium chloride 0.9 % 1,000 mL for intraperitoneal lavage  Status:  Discontinued       As needed 05/25/15 0905 05/25/15 1435   05/25/15 0600  clindamycin (CLEOCIN) IVPB 900 mg     900 mg 100 mL/hr over 30 Minutes Intravenous 60 min pre-op 05/24/15 1351 05/25/15 1403   05/25/15 0600  gentamicin (GARAMYCIN) 520 mg in dextrose 5 % 100 mL IVPB     5 mg/kg  103.9 kg (Adjusted) 113 mL/hr over 60 Minutes Intravenous 60 min pre-op 05/24/15 1351 05/25/15 0814   05/25/15 0600  clindamycin (CLEOCIN) 900 mg, gentamicin (GARAMYCIN) 240 mg in sodium chloride 0.9 % 1,000 mL for intraperitoneal lavage  Status:  Discontinued     Comments:  Pharmacy may adjust dosing strength, schedule, rate of infusion, etc as needed to optimize therapy    Intraperitoneal To Surgery 05/24/15 1351 05/25/15 1602      Assessment/Plan: Problem List: Patient Active Problem List   Diagnosis Date Noted  . Rectal cancer ([place]) 05/25/2015  . Hypertension   . IBS (irritable bowel syndrome)   . Depression   . Anemia 02/20/2015  . Cavernous hemangioma of liver - segments 5 & 7 02/07/2015  . Umbilical hernia 34/28/7681  . Osteoarthritis of left knee 02/07/2015  . Crohn's disease ([place]) 02/07/2015  . Rectal adenocarcinoma ([place]) 01/31/2015  . Pure hypercholesterolemia 02/21/2014  . Morbid obesity with BMI of 50.0-59.9, adult ([place]) 02/21/2014  . Family history of ischemic heart disease 02/21/2014    Doing well.  Possible discharge tomorrow.   3 Days Post-Op    LOS: 3 days   Matt B. [place]ell Done, MD, [facility] [phone] beeper [phone]  05/28/2015 9:21 AM

## 2015-05-28 NOTE — Progress Notes (Signed)
Dr.Rosenbower aware via phone small incision on lower lt abd opened up recently with small amt of drainage. Dry gauze dressing applied. Md said the gauze dressing appropriate and no new orders received at this time.

## 2015-05-29 NOTE — Discharge Summary (Addendum)
Physician Discharge Summary  [name] MRN: [id number] DOB/AGE: April 25, 1966 49 y.o.  Admit date: 05/25/2015 Discharge date: 05/29/2015  Patient Care Team: [name], MD as PCP - General (Family Medicine) Michael Boston, MD as Consulting Physician (General Surgery) Truitt Merle, MD as Consulting Physician (Medical Oncology) Kyung Rudd, MD as Consulting Physician (Radiation Oncology) Teena Irani, MD as Consulting Physician (Gastroenterology)  Admission Diagnoses: Principal Problem:   Rectal adenocarcinoma [facility]) Active Problems:   Morbid obesity with BMI of 50.0-59.9, adult (HCC)   Crohn's disease ([place])   Anemia   Hypertension   IBS (irritable bowel syndrome)   Depression   Rectal cancer [facility])   Discharge Diagnoses:  Principal Problem:   Rectal adenocarcinoma ([place]) Active Problems:   Morbid obesity with BMI of 50.0-59.9, adult (HCC)   Crohn's disease ([place])   Anemia   Hypertension   IBS (irritable bowel syndrome)   Depression   Rectal cancer ([place])   POST-OPERATIVE DIAGNOSIS:   Proximal Rectal Cancer  SURGERY:  05/25/2015  XI ROBOTIC ASSISTED RECTOSIGMOID LOWER ANTERIOR RESECTION Laparoscopic and robotic lysis of adhesions X 60 MINUTES RIGID PROCTOSCOPY RIGHT SALPINGO-OOPHORECTOMY INSERTION PORT-A-CATH in R IJ  SURGEON:    Surgeon(s): Michael Boston, MD Leighton Ruff, MD  OR FINDINGS:   Patient had a bulky tumor in proximal and mid rectum. Most of it focused on the right anterolateral circumference. Still came down to around 9 cm from the anal verge.  I enlarged right ovary was some firm and mainly cystic areas. At least 6 cm in largest dimension. Left side looked atrophic and normal. Right ovary removed.  PowerPort is a ClearVUE 8 [place]. Reference #[id number] Lot number [id number] It is in the right internal jugular vein. The tip sits at the right atrial/superior vena caval junction  No obvious metastatic disease on visceral parietal  peritoneum or liver.  The anastomosis rests 5-6 cm from the anal verge by rigid proctoscopy.  Consults: None  Hospital Course:   The patient underwent the surgery above.  Postoperatively, the patient gradually mobilized and advanced to a solid diet.  Pain and other symptoms were treated aggressively.    By the time of discharge, the patient was walking well the hallways, eating food, having flatus & BMs.  Pain was well-controlled on an oral medications.  Drain removed.  Dressings & wicks removed.  Based on meeting discharge criteria and continuing to recover, I felt it was safe for the patient to be discharged from the hospital to further recover with close followup. Postoperative recommendations were discussed in detail with the patient & family.  They are written as well.   Significant Diagnostic Studies:  No results found for this or any previous visit (from the past 72 hour(s)).  Dg Chest Port 1 View  05/25/2015  CLINICAL DATA:  Status post Port-A-Cath placement EXAM: PORTABLE CHEST 1 VIEW COMPARISON:  CT chest dated 02/07/2015 FINDINGS: Right chest port terminates at the cavoatrial junction. Low lung volumes. Bilateral upper lobe and left lower lobe opacities, possibly atelectasis. No pleural effusion or pneumothorax. The heart is top-normal in size for inspiration. IMPRESSION: Right chest port terminates at the cavoatrial junction. Electronically Signed   By: Julian Hy M.D.   On: 05/25/2015 15:34   Dg C-arm 1-60 Min-no Report  05/25/2015  CLINICAL DATA: surg C-ARM 1-60 MINUTES Fluoroscopy was utilized by the requesting physician.  No radiographic interpretation.    Discharge Exam: Blood pressure 131/72, pulse 70, temperature 98.1 F (36.7 C), temperature source Oral, resp.  rate 18, height 5' 10.5" (1.791 m), weight 155.039 kg (341 lb 12.8 oz), SpO2 99 %.  General: Pt awake/alert/oriented x4 in no major acute distress Eyes: PERRL, normal EOM. Sclera nonicteric Neuro: CN  II-XII intact w/o focal sensory/motor deficits. Lymph: No head/neck/groin lymphadenopathy Psych:  No delerium/psychosis/paranoia HENT: Normocephalic, Mucus membranes moist.  No thrush Neck: Supple, No tracheal deviation.  Mild soreness. Chest: No pain.  Good respiratory excursion. CV:  Pulses intact.  Regular rhythm MS: Normal AROM mjr joints.  No obvious deformity Abdomen: Soft, Nondistended.  Mildly TTP at extraction iucision only.  Nontender.  No incarcerated hernias.  No drainage/bleeding Ext:  SCDs BLE.  No significant edema.  No cyanosis Skin: No petechiae / purpura  Discharged Condition: good   Past Medical History  Diagnosis Date  . Hypertension   . Hypercholesteremia   . Obesity   . Tear of left rotator cuff   . IBS (irritable bowel syndrome)   . Vertigo   . TMJ (dislocation of temporomandibular joint)   . Bulging disc     L5  . Depression   . Rectal bleeding   . Vitamin D deficiency   . Osteoarthritis, knee   . S/P radiation therapy 02/20/15-03/30/15    colon/rectal  . Complication of anesthesia   . PONV (postoperative nausea and vomiting)   . History of mitral valve prolapse   . Mild sleep apnea     does not use CPAP  . History of chemotherapy   . Numbness and tingling     great toe bilat left more than right   . History of frequent ear infections   . History of frequent urinary tract infections     middle school age  . GERD (gastroesophageal reflux disease)   . Cancer ([place])   . Anemia   . History of blood transfusion   . Fibroids     Past Surgical History  Procedure Laterality Date  . Rotator cuff repair    . Eus N/A 01/18/2015    Procedure: LOWER ENDOSCOPIC ULTRASOUND (EUS);  Surgeon: Arta Silence, MD;  Location: [facility];  Service: Endoscopy;  Laterality: N/A;  . Abdominal hysterectomy  1996    Removed uterus, and tubes  . Left knee arthroscopy      mult  . Tonsillectomy    . Dilation and curettage of uterus      times 2  . Xi robotic  assisted lower anterior resection N/A 05/25/2015    Procedure: XI ROBOTIC ASSISTED LOWER ANTERIOR RESECTION, , RIGID PROCTOSCOPY, RIGHT OOPHORECTOMY;  Surgeon: Michael Boston, MD;  Location: [facility];  Service: General;  Laterality: N/A;  . Portacath placement N/A 05/25/2015    Procedure: INSERTION PORT-A-CATH;  Surgeon: Michael Boston, MD;  Location: [facility];  Service: General;  Laterality: N/A;    Social History   Social History  . Marital Status: Married    Spouse Name: N/A  . Number of Children: N/A  . Years of Education: N/A   Occupational History  . Not on file.   Social History Main Topics  . Smoking status: Former Smoker -- 0.50 packs/day for 4 years    Types: Cigarettes    Quit date: 07/15/2000  . Smokeless tobacco: Never Used     Comment: 1 pack/week intermittently x 7 years  . Alcohol Use: 0.6 oz/week    1 Shots of liquor per week     Comment: rarely   . Drug Use: No  . Sexual Activity: Not on file  Other Topics Concern  . Not on file   Social History Narrative   Tobacco Use: Cigarettes - Former Smoker   Alcohol: Yes, very rare, liquor.    No recreational drug use   Occupation: Head CMA @ [employer]   Marital Status: Married    Husband: Roselyn Reef Disabled   Children: 2 adopted kids [place]   Religion: [employer] [place]                   Family History  Problem Relation Age of Onset  . Coronary artery disease Mother 53  . Hypertension Mother   . Hyperlipidemia Mother   . Diabetes Mellitus I Mother   . Coronary artery disease Father   . Hyperlipidemia Father   . Hypertension Father   . Cancer Sister     skin - non melanoma  . Hyperlipidemia Brother   . Cancer Maternal Uncle 67    pancreatic with mets to colon and prostate  . Cirrhosis Maternal Uncle   . Hypertension Maternal Grandmother   . Diabetes Mellitus I Maternal Grandmother   . Hyperlipidemia Maternal Grandmother   . CVA Maternal Grandmother   . Hypertension Maternal  Grandfather   . Coronary artery disease Maternal Grandfather 65  . Hyperlipidemia Maternal Grandfather   . Coronary artery disease Paternal Grandmother   . Hypertension Paternal Grandmother   . Hyperlipidemia Paternal Grandmother   . Diabetes Mellitus I Paternal Grandmother   . Hypertension Paternal Grandfather   . Hyperlipidemia Paternal Grandfather   . Coronary artery disease Paternal Grandfather     Current Facility-Administered Medications  Medication Dose Route Frequency Provider Last Rate Last Dose  . 0.9 %  sodium chloride infusion  250 mL Intravenous PRN Michael Boston, MD      . acetaminophen (TYLENOL) tablet 1,000 mg  1,000 mg Oral TID Michael Boston, MD   1,000 mg at 05/28/15 2105  . alum & mag hydroxide-simeth (MAALOX/MYLANTA) 200-200-20 MG/5ML suspension 30 mL  30 mL Oral Q6H PRN Michael Boston, MD      . diphenhydrAMINE (BENADRYL) 12.5 MG/5ML elixir 12.5 mg  12.5 mg Oral Q6H PRN Michael Boston, MD       Or  . diphenhydrAMINE (BENADRYL) injection 12.5 mg  12.5 mg Intravenous Q6H PRN Michael Boston, MD      . DULoxetine (CYMBALTA) DR capsule 60 mg  60 mg Oral QHS Michael Boston, MD   60 mg at 05/28/15 2105  . enoxaparin (LOVENOX) injection 40 mg  40 mg Subcutaneous Q24H Michael Boston, MD   40 mg at 05/28/15 9758  . feeding supplement (ENSURE ENLIVE) (ENSURE ENLIVE) liquid 237 mL  237 mL Oral BID BM Michael Boston, MD   237 mL at 05/26/15 0925  . HYDROmorphone (DILAUDID) injection 1-2 mg  1-2 mg Intravenous Q1H PRN Michael Boston, MD   1 mg at 05/28/15 2004  . lip balm (CARMEX) ointment 1 application  1 application Topical BID Michael Boston, MD   1 application at 83/25/49 2106  . magic mouthwash  15 mL Oral QID PRN Michael Boston, MD      . menthol-cetylpyridinium (CEPACOL) lozenge 3 mg  1 lozenge Oral PRN Michael Boston, MD      . methocarbamol (ROBAXIN) 1,000 mg in dextrose 5 % 50 mL IVPB  1,000 mg Intravenous Q6H PRN Michael Boston, MD      . metoprolol (LOPRESSOR) injection 5 mg  5 mg Intravenous  Q6H PRN Michael Boston, MD      .  nebivolol (BYSTOLIC) tablet 5 mg  5 mg Oral QHS Michael Boston, MD   5 mg at 05/28/15 2216  . ondansetron (ZOFRAN) tablet 4 mg  4 mg Oral Q6H PRN Michael Boston, MD       Or  . ondansetron [facility]) injection 4 mg  4 mg Intravenous Q6H PRN Michael Boston, MD      . pantoprazole (PROTONIX) EC tablet 80 mg  80 mg Oral BID Michael Boston, MD   80 mg at 05/28/15 2105  . phenol (CHLORASEPTIC) mouth spray 2 spray  2 spray Mouth/Throat PRN Michael Boston, MD      . promethazine (PHENERGAN) injection 6.25-12.5 mg  6.25-12.5 mg Intravenous Q4H PRN Michael Boston, MD      . rosuvastatin (CRESTOR) tablet 5 mg  5 mg Oral QHS Michael Boston, MD   5 mg at 05/28/15 2105  . saccharomyces boulardii (FLORASTOR) capsule 250 mg  250 mg Oral BID Michael Boston, MD   250 mg at 05/28/15 2105  . sodium chloride 0.9 % injection 3 mL  3 mL Intravenous Q12H Michael Boston, MD   3 mL at 05/28/15 2106  . sodium chloride 0.9 % injection 3 mL  3 mL Intravenous PRN Michael Boston, MD      . vitamin C (ASCORBIC ACID) tablet 500 mg  500 mg Oral BID Michael Boston, MD   500 mg at 05/28/15 2105     Allergies  Allergen Reactions  . Caine-1 [Lidocaine] Rash    Eyes swell shut; includes all caine drugs except marcaine  . Penicillins Rash    Has patient had a PCN reaction causing immediate rash, facial/tongue/throat swelling, SOB or lightheadedness with hypotension: no Has patient had a PCN reaction causing severe rash involving mucus membranes or skin necrosis: no Has patient had a PCN reaction that required hospitalization no Has patient had a PCN reaction occurring within the last 10 years: no If all of the above answers are "NO", then may proceed with Cephalosporin use.   . Sulfa Antibiotics Rash    Rash & Vomiting    Disposition: 01-Home or Self Care  Discharge Instructions    Call MD for:  extreme fatigue    Complete by:  As directed      Call MD for:  hives    Complete by:  As directed      Call MD for:   persistant nausea and vomiting    Complete by:  As directed      Call MD for:  redness, tenderness, or signs of infection (pain, swelling, redness, odor or green/yellow discharge around incision site)    Complete by:  As directed      Call MD for:  severe uncontrolled pain    Complete by:  As directed      Call MD for:    Complete by:  As directed   Temperature > 101.19F     Discharge instructions    Complete by:  As directed   Please see discharge instruction sheets.  Also refer to handout given an office.  Please call our office if you have any questions or concerns [phone]     Discharge wound care:    Complete by:  As directed   If you have closed incisions, shower and bathe over these incisions with soap and water every day.  Remove all surgical dressings on postoperative day #3.  You do not need to replace dressings over the closed incisions unless you feel more comfortable with  a Band-Aid covering it.   If you have an open wound that requires packing, please see wound care instructions.  In general, remove all dressings, wash wound with soap and water and then replace with saline moistened gauze.  Do the dressing change at least every day.  Please call our office [phone] if you have further questions.     Driving Restrictions    Complete by:  As directed   No driving until off narcotics and can safely swerve away without pain during an emergency     Increase activity slowly    Complete by:  As directed   Walk an hour a day.  Use 20-30 minute walks.  When you can walk 30 minutes without difficulty, it is fine to restart low impact/moderate activities such as biking, jogging, swimming, sexual activity, etc.  Eventually you can increase to unrestricted activity when not feeling pain.  If you feel pain: STOP!Marland Kitchen   Let pain protect you from overdoing it.  Use ice/heat & over-the-counter pain medications to help minimize soreness.  If that is not enough, then use your narcotic pain  prescription as needed to remain active.  It is better to take extra pain medications and be more active than to stay bedridden to avoid all pain medications.     Lifting restrictions    Complete by:  As directed   Avoid heavy lifting initially.  Do not push through pain.  You have no specific weight limit - if it hurts to do, DON'T DO IT.   If you feel no pain, you are not injuring anything.  Pain will protect you from injury.  Coughing and sneezing are far more stressful to your incision than any lifting.  Avoid resuming heavy lifting / intense activity until off all narcotic pain medications.  When ready to exercise more, give yourself 2 weeks to gradually get back to full intense exercise/activity.     May shower / Bathe    Complete by:  As directed      May walk up steps    Complete by:  As directed      Sexual Activity Restrictions    Complete by:  As directed   Sexual activity as tolerated.  Do not push through pain.  Pain will protect you from injury.     Walk with assistance    Complete by:  As directed   Walk over an hour a day.  May use a walker/cane/companion to help with balance and stamina.            Medication List    STOP taking these medications        HYDROcodone-acetaminophen 5-325 MG tablet  Commonly known as:  NORCO/VICODIN  Replaced by:  HYDROcodone-acetaminophen 10-325 MG tablet     traMADol 50 MG tablet  Commonly known as:  ULTRAM      TAKE these medications        CoQ-10 100 MG Caps  Take 100 mg by mouth daily.     DONNATAL 16.2 MG tablet  Generic drug:  belladona alk-PHENObarbital  Take 1 tablet by mouth every 8 (eight) hours as needed (abdominal cramps).     DULoxetine 60 MG capsule  Commonly known as:  CYMBALTA  Take 60 mg by mouth at bedtime.     esomeprazole 40 MG capsule  Commonly known as:  NEXIUM  Take 40 mg by mouth 2 (two) times daily.     furosemide 20 MG tablet  Commonly known as:  LASIX  Take 20 mg by mouth as needed for fluid  or edema.     HYDROcodone-acetaminophen 10-325 MG tablet  Commonly known as:  NORCO  Take 1-2 tablets by mouth every 6 (six) hours as needed for moderate pain or severe pain.     hydrocortisone 2.5 % cream  Apply 1 application topically as needed (rash).     hyoscyamine 0.375 MG 12 hr tablet  Commonly known as:  LEVBID  Take 0.375 mg by mouth 2 (two) times daily.     meloxicam 15 MG tablet  Commonly known as:  MOBIC  Take 15 mg by mouth daily.     nebivolol 10 MG tablet  Commonly known as:  BYSTOLIC  Take 10 mg by mouth at bedtime.     rosuvastatin 5 MG tablet  Commonly known as:  CRESTOR  Take 5 mg by mouth at bedtime.     Vitamin D (Ergocalciferol) 50000 UNITS Caps capsule  Commonly known as:  DRISDOL  Take 50,000 Units by mouth 2 (two) times a week.           Follow-up Information    Follow up with Derrian Rodak C., MD. Schedule an appointment as soon as possible for a visit in 2 weeks.   Specialty:  General Surgery   Why:  To follow up after your operation, To follow up after your hospital stay   Contact information:   [place] [address] [phone]       Schedule an appointment as soon as possible for a visit with Truitt Merle, MD.   Specialties:  Hematology, Oncology   Why:  To have your Port-a-catheter flushed & discuss cancer treatment   Contact information:   [place] [address] [phone]        Signed: Morton Peters, M.D., F.A.C.S. Gastrointestinal and Minimally Invasive Surgery [facility] [address] [place], [place] [address] [phone] Main / Paging   05/29/2015, 7:44 AM

## 2015-05-29 NOTE — Progress Notes (Signed)
Pt's vitals are WNL, tolerating diet and pain is under control. Per order, JP drain was pulled this am. Pt tolerated it well. Discussed discharge instructions with both patient and husband. Neither had any questions nor concerns. Discharged to home with pain prescription.

## 2015-05-31 ENCOUNTER — Telehealth: Payer: Self-pay | Admitting: *Deleted

## 2015-05-31 NOTE — Telephone Encounter (Signed)
Patient called as "instructed by Dr.Gross asking if anything needs to be done to her porta-cath that was inserted 05-25-2015.  Do I need to have it flushed?  I'm scheduled to see Dr. Burr Medico 06-14-2015.  This nurse advised port-a-cath's are flushed every 8 to 12 weeks.  Will notify Dr. Burr Medico for any orders specific to her and someone will call with any scheduling needs.

## 2015-06-07 NOTE — Telephone Encounter (Signed)
No new orders

## 2015-06-14 ENCOUNTER — Other Ambulatory Visit (HOSPITAL_BASED_OUTPATIENT_CLINIC_OR_DEPARTMENT_OTHER): Payer: BLUE CROSS/BLUE SHIELD

## 2015-06-14 ENCOUNTER — Encounter: Payer: Self-pay | Admitting: Hematology

## 2015-06-14 ENCOUNTER — Telehealth: Payer: Self-pay | Admitting: Hematology

## 2015-06-14 ENCOUNTER — Encounter: Payer: Self-pay | Admitting: *Deleted

## 2015-06-14 ENCOUNTER — Ambulatory Visit (HOSPITAL_BASED_OUTPATIENT_CLINIC_OR_DEPARTMENT_OTHER): Payer: BLUE CROSS/BLUE SHIELD

## 2015-06-14 ENCOUNTER — Ambulatory Visit (HOSPITAL_BASED_OUTPATIENT_CLINIC_OR_DEPARTMENT_OTHER): Payer: BLUE CROSS/BLUE SHIELD | Admitting: Hematology

## 2015-06-14 VITALS — BP 139/56 | HR 73 | Temp 98.7°F | Resp 20 | Ht 70.5 in | Wt 332.0 lb

## 2015-06-14 DIAGNOSIS — K922 Gastrointestinal hemorrhage, unspecified: Secondary | ICD-10-CM | POA: Diagnosis not present

## 2015-06-14 DIAGNOSIS — D509 Iron deficiency anemia, unspecified: Secondary | ICD-10-CM

## 2015-06-14 DIAGNOSIS — D500 Iron deficiency anemia secondary to blood loss (chronic): Secondary | ICD-10-CM

## 2015-06-14 DIAGNOSIS — C20 Malignant neoplasm of rectum: Secondary | ICD-10-CM

## 2015-06-14 DIAGNOSIS — D638 Anemia in other chronic diseases classified elsewhere: Secondary | ICD-10-CM | POA: Insufficient documentation

## 2015-06-14 DIAGNOSIS — C19 Malignant neoplasm of rectosigmoid junction: Secondary | ICD-10-CM | POA: Diagnosis not present

## 2015-06-14 DIAGNOSIS — R109 Unspecified abdominal pain: Secondary | ICD-10-CM | POA: Diagnosis not present

## 2015-06-14 DIAGNOSIS — R197 Diarrhea, unspecified: Secondary | ICD-10-CM

## 2015-06-14 LAB — FERRITIN CHCC: Ferritin: 133 ng/ml (ref 9–269)

## 2015-06-14 LAB — CBC WITH DIFFERENTIAL/PLATELET
BASO%: 0.8 % (ref 0.0–2.0)
Basophils Absolute: 0.1 10*3/uL (ref 0.0–0.1)
EOS ABS: 0.4 10*3/uL (ref 0.0–0.5)
EOS%: 4.4 % (ref 0.0–7.0)
HCT: 26.6 % — ABNORMAL LOW (ref 34.8–46.6)
HEMOGLOBIN: 8.3 g/dL — AB (ref 11.6–15.9)
LYMPH%: 8.5 % — AB (ref 14.0–49.7)
MCH: 26 pg (ref 25.1–34.0)
MCHC: 31.2 g/dL — ABNORMAL LOW (ref 31.5–36.0)
MCV: 83.3 fL (ref 79.5–101.0)
MONO#: 0.8 10*3/uL (ref 0.1–0.9)
MONO%: 7.8 % (ref 0.0–14.0)
NEUT%: 78.5 % — ABNORMAL HIGH (ref 38.4–76.8)
NEUTROS ABS: 7.7 10*3/uL — AB (ref 1.5–6.5)
Platelets: 537 10*3/uL — ABNORMAL HIGH (ref 145–400)
RBC: 3.19 10*6/uL — AB (ref 3.70–5.45)
RDW: 19.2 % — AB (ref 11.2–14.5)
WBC: 9.9 10*3/uL (ref 3.9–10.3)
lymph#: 0.8 10*3/uL — ABNORMAL LOW (ref 0.9–3.3)

## 2015-06-14 LAB — COMPREHENSIVE METABOLIC PANEL (CC13)
ALBUMIN: 2.7 g/dL — AB (ref 3.5–5.0)
ALK PHOS: 78 U/L (ref 40–150)
ALT: 12 U/L (ref 0–55)
ANION GAP: 10 meq/L (ref 3–11)
AST: 10 U/L (ref 5–34)
BUN: 11.1 mg/dL (ref 7.0–26.0)
CO2: 27 mEq/L (ref 22–29)
Calcium: 10.2 mg/dL (ref 8.4–10.4)
Chloride: 104 mEq/L (ref 98–109)
Creatinine: 0.7 mg/dL (ref 0.6–1.1)
EGFR: >90 mL/min/{1.73_m2} (ref >90)
Glucose: 99 mg/dl (ref 70–140)
POTASSIUM: 4.3 meq/L (ref 3.5–5.1)
SODIUM: 141 meq/L (ref 136–145)
TOTAL PROTEIN: 7.5 g/dL (ref 6.4–8.3)

## 2015-06-14 LAB — IRON AND TIBC CHCC
%SAT: 9 % — ABNORMAL LOW (ref 21–57)
IRON: 21 ug/dL — AB (ref 41–142)
TIBC: 236 ug/dL (ref 236–444)
UIBC: 216 ug/dL (ref 120–384)

## 2015-06-14 MED ORDER — LIDOCAINE-PRILOCAINE 2.5-2.5 % EX CREA: TOPICAL_CREAM | CUTANEOUS | Status: DC

## 2015-06-14 MED ORDER — ONDANSETRON HCL 8 MG PO TABS: 8.0000 mg | ORAL_TABLET | Freq: Two times a day (BID) | ORAL | Status: DC

## 2015-06-14 NOTE — Telephone Encounter (Signed)
Gave patient appt for 12/14 @ 10 chemo edu, 12/15 @ 11:15 lab, 11:30 MD. Calendar printed

## 2015-06-14 NOTE — Progress Notes (Signed)
[facility]  [phone] [phone]  Clinic Follow Up Note   Patient Care Team: [name], MD as PCP - General (Family Medicine) Michael Boston, MD as Consulting Physician (General Surgery) Truitt Merle, MD as Consulting Physician (Medical Oncology) Kyung Rudd, MD as Consulting Physician (Radiation Oncology) Teena Irani, MD as Consulting Physician (Gastroenterology) 06/14/2015  CHIEF COMPLAINTS:  Follow up rectal cancer   Oncology History   Rectal adenocarcinoma   Staging form: Colon and Rectum, AJCC 7th Edition     Clinical: T3, N2, M0 - Unsigned       Rectal adenocarcinoma ([place])   01/13/2015 Initial Diagnosis Rectal adenocarcinoma   01/13/2015 Procedure Colonoscopy showed a sensitivity O nonobstructing mass in the rectum and from 12-18 cm proximal to the Annis. The mass was circumferential, measuring about 6 cm in length. EGD was negative.   01/17/2015 Tumor Marker CEA 1.4, CA-19-9 8, MMR normal    01/18/2015 Procedure Lower endoscopic ultrasound by Dr. Paulita Fujita showed a T3 N2 rectal mass.   01/20/2015 Imaging CT abdomen and pelvis with contrast showed right lateral rectal wall exophytic mass, and 2 ill-defined hepatic hypoenhancing lesions which appears to correspond to the lesion seen on the prior MRI in 2015.   02/05/2015 Imaging abdomen MRI showed 2 hemangioma, no suspicion for metastatic disease. CT chest was negative   02/20/2015 - 03/30/2015 Radiation Therapy neoadjuvant RT to rectal cancer    02/20/2015 Concurrent Chemotherapy capecitabine 2500 mg in the morning and 2000 mg in the evening (865m/m2, bid), on the day of radiation.    05/25/2015 Surgery Recto-sigmoid segmental resection, margins are negative    05/25/2015 Pathology Results 0.2cm residual invasive adenocarcinoma, G2, negative margins, 12 nodes were negative     HISTORY OF PRESENTING ILLNESS:  Lindsay GAIL49y.o. female is here because of evaluation for management for newly diagnosed  rectal adenocarcinoma.   On 09/06/13, she presented to Dr. HStarr [facility]] with 435-monthistory of diarrhea and worsening abdominal pain. She has a history of chronic abdominal pain and had been previously diagnosed [place] by Dr. HaAmedeo PlentyAbdominal [place]/6/15 noted to have some hypoechoicity in the left hepatic lobe which was confirmed on MRI to be two hemangiomas  in the right hepatic lobe of measuring up to 3.0 cm along with an additional 7 mm probable cyst.   She presented with worsening of diarrhea for 6 month and was reevaluated by Dr. HaAmedeo PlentyShe was seen on 12/06/14 at which time it was decided she would undergo HIDA scan given that the pain was intermittently in the RUQ in addition to upper and lower endoscopy. HIDA scan 12/15/14 was reassuring, but she was found to have a malignant tumor of the rectum about 12-18 cm proximal to the anus, gastritis, gastric polyps on endoscopies 01/13/15. Biopsies for these three specimens along with the duodenum were collected and resulted on 01/17/15 for invasive adenocarcinoma, chronic gastritis [H. Pylori negative], fundic gland polyps, and non-specific mild patchy intra-epithelial lymphocytes. Endoscopic [place]/6/16 staged the mass at T3N2Mx and she is here today for consideration of neoadjuvant therapy.    Today, she does report abdominal pain but is no different than her baseline [right-sided, upper, lower] but has had worsening appetite over the last 2 months. She does have diarrhea with bowel movements every 1-2 hours, especially after eating, and has been noticing bleeding with her bowel movement following her endoscopic  evaluation. She also feels her energy level has decreased. Otherwise, she denies nausea, chest pain, shortness of breath.    Family history is notable for a maternal uncle who had pancreatic cancer diagnosed in his mid to late 77s that spread to the colon and prostate  before he died about 52 years later and a sister with non-melanoma skin cancer. 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 She smoked roughly 20 pack years [1 pack/week x 7 years] in the past and reports occasional alcohol use but denies any prior illicit drug use.  She lost about 7 lbs in the past 2 months  CURRENT THERAPY: Pending adjuvant chemo FOLFOX    INTERIM HISTORY Lindsay Little returns for follow-up. She  Underwent recto-sigmoid colon segmental resection by Dr. Johney Maine on 05/25/2015.  He tolerated the surgery well, and has been recovering slowly. Her main issue is the pain,  She feels like being kicked by a horse in the back.  Her pain is not well-controlled, she takes hydrocodone a few times a day, probably not frequently enough. She appears quite uncomfortable during her visit.  She also has moderate diarrhea, was  Not using Imodium enough, but this started yesterday which helped.  Her appetite is also moderate to low, has occasional nausea.  She lost about 10 pounds since her surgery. She walks around at home , but has not gone outside much.   HISTORY:  Past Medical History  Diagnosis Date  . Hypertension   . Hypercholesteremia   . Obesity   . Tear of left rotator cuff   . IBS (irritable bowel syndrome)   . Vertigo   . TMJ (dislocation of temporomandibular joint)   . Bulging disc     L5  . Depression   . Rectal bleeding   . Vitamin D deficiency   . Osteoarthritis, knee   . S/P radiation therapy 02/20/15-03/30/15    colon/rectal  . Complication of anesthesia   . PONV (postoperative nausea and vomiting)   . History of mitral valve prolapse   . Mild sleep apnea     does not use CPAP  . History of chemotherapy   . Numbness and tingling     great toe bilat left more than right   . History of frequent ear infections   . History of frequent urinary tract infections     middle school age  . GERD (gastroesophageal reflux disease)     . Cancer ([place])   . Anemia   . History of blood transfusion   . Fibroids     SURGICAL HISTORY: Past Surgical History  Procedure Laterality Date  . Rotator cuff repair    . Eus N/A 01/18/2015    Procedure: LOWER ENDOSCOPIC ULTRASOUND (EUS);  Surgeon: Arta Silence, MD;  Location: [facility];  Service: Endoscopy;  Laterality: N/A;  . Abdominal hysterectomy  1996    Removed uterus, and tubes  . Left knee arthroscopy      mult  . Tonsillectomy    . Dilation and curettage of uterus      times 2  . Xi robotic assisted lower anterior resection N/A 05/25/2015    Procedure: XI ROBOTIC ASSISTED LOWER ANTERIOR RESECTION, , RIGID PROCTOSCOPY, RIGHT OOPHORECTOMY;  Surgeon: Michael Boston, MD;  Location: [facility];  Service: General;  Laterality: N/A;  . Portacath placement N/A 05/25/2015    Procedure: INSERTION PORT-A-CATH;  Surgeon: Michael Boston, MD;  Location: [facility];  Service: General;  Laterality: N/A;    SOCIAL HISTORY: Social History   Social History  . Marital Status: Married    Spouse Name: N/A  . Number of Children: N/A  . Years of Education: N/A   Occupational History  . Not on file.   Social History Main Topics  . Smoking status: Former Smoker -- 0.50 packs/day for 4 years    Types: Cigarettes    Quit date: 07/15/2000  . Smokeless tobacco: Never Used     Comment: 1 pack/week intermittently x 7 years  . Alcohol Use: 0.6 oz/week    1 Shots of liquor per week     Comment: rarely   . Drug Use: No  . Sexual Activity: Not on file   Other Topics Concern  . Not on file   Social History Narrative   Tobacco Use: Cigarettes - Former Smoker   Alcohol: Yes, very rare, liquor.    No recreational drug use   Occupation: Head CMA @ [employer]   Marital Status: Married    Husband: Roselyn Reef Disabled   Children: 2 adopted kids [place]   Religion: [employer] [place] HISTORY: Family History  Problem Relation Age of Onset  .  Coronary artery disease Mother 61  . Hypertension Mother   . Hyperlipidemia Mother   . Diabetes Mellitus I Mother   . Coronary artery disease Father   . Hyperlipidemia Father   . Hypertension Father   . Cancer Sister     skin - non melanoma  . Hyperlipidemia Brother   . Cancer Maternal Uncle 26    pancreatic with mets to colon and prostate  . Cirrhosis Maternal Uncle   . Hypertension Maternal Grandmother   . Diabetes Mellitus I Maternal Grandmother   . Hyperlipidemia Maternal Grandmother   . CVA Maternal Grandmother   . Hypertension Maternal Grandfather   . Coronary artery disease Maternal Grandfather 65  . Hyperlipidemia Maternal Grandfather   . Coronary artery disease Paternal Grandmother   . Hypertension Paternal Grandmother   . Hyperlipidemia Paternal Grandmother   . Diabetes Mellitus I Paternal Grandmother   . Hypertension Paternal Grandfather   . Hyperlipidemia Paternal Grandfather   . Coronary artery disease Paternal Grandfather     ALLERGIES:  is allergic to caine-1; penicillins; and sulfa antibiotics.  MEDICATIONS:  Current Outpatient Prescriptions  Medication Sig Dispense Refill  . clotrimazole (MYCELEX) 10 MG troche   0  . Coenzyme Q10 (COQ-10) 100 MG CAPS Take 100 mg by mouth daily.    . DULoxetine (CYMBALTA) 60 MG capsule Take 60 mg by mouth at bedtime.     Marland Kitchen esomeprazole (NEXIUM) 40 MG capsule Take 40 mg by mouth 2 (two) times daily.    . furosemide (LASIX) 20 MG tablet Take 20 mg by mouth as needed for fluid or edema.     Marland Kitchen HYDROcodone-acetaminophen (NORCO) 10-325 MG tablet Take 1-2 tablets by mouth every 6 (six) hours as needed for moderate pain or severe pain. (Patient taking differently: Take 1-2 tablets by mouth every 4 (four) hours as needed for moderate pain or severe pain. ) 40 tablet 0  . hydrocortisone 2.5 % cream Apply 1 application topically as needed (rash).     . hyoscyamine (LEVBID) 0.375 MG 12 hr tablet Take 0.375 mg by mouth 2 (two) times daily.     . meloxicam (MOBIC) 15 MG  tablet Take 15 mg by mouth daily.    . methocarbamol (ROBAXIN) 500 MG tablet Take 500 mg by mouth 4 (four) times daily as needed.  1  . nebivolol (BYSTOLIC) 10 MG tablet Take 10 mg by mouth at bedtime.     . rosuvastatin (CRESTOR) 5 MG tablet Take 5 mg by mouth at bedtime.     . Vitamin D, Ergocalciferol, (DRISDOL) 50000 UNITS CAPS capsule Take 50,000 Units by mouth 2 (two) times a week.     No current facility-administered medications for this visit.    REVIEW OF SYSTEMS:   Constitutional: Denies fevers, chills or abnormal night sweats Eyes: Denies blurriness of vision, double vision or watery eyes Ears, nose, mouth, throat, and face: Denies mucositis or sore throat Respiratory: Denies cough, dyspnea or wheezes Cardiovascular: Denies palpitation, chest discomfort or lower extremity swelling Gastrointestinal:  Diarrhea, abdominal pain, hematochezia Skin: Denies abnormal skin rashes Lymphatics: Denies new lymphadenopathy or easy bruising Neurological:Denies numbness, tingling or new weaknesses Behavioral/Psych: Mood is stable, no new changes  All other systems were reviewed with the patient and are negative.  PHYSICAL EXAMINATION: ECOG PERFORMANCE STATUS: 1 - Symptomatic but completely ambulatory  Filed Vitals:   06/14/15 0854  BP: 139/56  Pulse: 73  Temp: 98.7 F (37.1 C)  Resp: 20   Filed Weights   06/14/15 0854  Weight: 332 lb (150.594 kg)    GENERAL:alert, no distress and comfortable SKIN: skin color, texture, turgor are normal, no rashes or significant lesions EYES: normal, conjunctiva are pink and non-injected, sclera clear OROPHARYNX:no exudate, no erythema and lips, buccal mucosa, and tongue normal  NECK: supple, thyroid normal size, non-tender, without nodularity LYMPH:  no palpable lymphadenopathy in the cervical, axillary or inguinal LUNGS: clear to auscultation and percussion with normal breathing effort HEART: regular rate &  rhythm and no murmurs and no lower extremity edema ABDOMEN:abdomen soft, normal bowel sounds. No organomegaly,  Multiple surgical incision scar are healing well. Musculoskeletal:no cyanosis of digits and no clubbing  PSYCH: alert & oriented x 3 with fluent speech NEURO: no focal motor/sensory deficits   LABORATORY DATA:  I have reviewed the data as listed CBC Latest Ref Rng 06/14/2015 05/26/2015 05/18/2015  WBC 3.9 - 10.3 10e3/uL 9.9 11.5(H) 6.5  Hemoglobin 11.6 - 15.9 g/dL 8.3(L) 10.0(L) 10.9(L)  Hematocrit 34.8 - 46.6 % 26.6(L) 31.7(L) 35.1(L)  Platelets 145 - 400 10e3/uL 537(H) 272 282    CMP Latest Ref Rng 06/14/2015 05/26/2015 05/18/2015  Glucose 70 - 140 mg/dl 99 115(H) 144(H)  BUN 7.0 - 26.0 mg/dL 11._0 Creatinine 0.6 - 1.1 mg/dL 0.7 0.60 0.68  Sodium 136 - 145 mEq/L 141 137 140  Potassium 3.5 - 5.1 mEq/L 4.3 4.3 4.0  Chloride 101 - 111 mmol/L - 106 107  CO2 22 - 29 mEq/L _1 Calcium 8.4 - 10.4 mg/dL 10.2 9.1 9.7  Total Protein 6.4 - 8.3 g/dL 7.5 - -  Total Bilirubin 0.20 - 1.20 mg/dL <0.30 - -  Alkaline Phos 40 - 150 U/L 78 - -  AST 5 - 34 U/L 10 - -  ALT 0 - 55 U/L 12 - -   PATHOLOGY REPORT Diagnosis 05/25/2015 1. Colon, segmental resection for tumor, recto-sigmoid INVASIVE COLONIC ADENOCARCINOMA (0.2 CM IN GREATEST DIMENSION RESIDUAL TUMOR) THE TUMOR INVADE SUBMUCOSA (0.3 CM IN DEPTH, PT1) MARGINS OF RESECTION ARE NEGATIVE FOR TUMOR TWELVE BENIGN LYMPH NODES (0/12) 2. Colon, resection margin (donut), proximal anastomotic ring BENIGN COLONIC TISSUE, NEGATIVE FOR  MALIGANCY 3. Colon, resection margin (donut), distal anastomotic ring BENIGN COLONIC TISSUE, NEGATIVE FOR MALIGNANCY 4. Ovary, right BENIGN SEROUS CYSTADENOMA.  Microscopic Comment 1. COLON AND RECTUM (INCLUDING TRANS-ANAL RESECTION): Specimen: Colon-rectum Procedure: Segmental resection Tumor site: Rectum anterior wall mid to distal third of rectum Specimen integrity: intact Macroscopic  intactness of mesorectum: Complete: _ Macroscopic tumor perforation: Negative Invasive tumor: Maximum size: Histologic type(s): G2 Histologic grade and differentiation: G1: well differentiated/low grade G2: moderately differentiated/low grade G3: poorly differentiated/high grade G4: undifferentiated/high grade Type of polyp in which invasive carcinoma arose: _ Microscopic extension of invasive tumor:Submucosa Lymph-Vascular invasion: Not identified Peri-neural invasion: negative Tumor deposit(s) (discontinuous extramural extension): Negative Resection margins: Proximal margin: Negative Distal margin: Negative Circumferential (radial) (posterior ascending, posterior descending; lateral and posterior mid-rectum; and entire lower 1/3 rectum):Negatvie Mesenteric margin (sigmoid and transverse): Negative Distance closest margin (if all above margins negative): 2.5 cm Treatment effect (neo-adjuvant therapy): Present Additional polyp(s): Negative Non-neoplastic findings: Unremarkable Lymph nodes: number examined 12; number positive: 0 Pathologic Staging: T1, N0, M_ Ancillary studies: Preserved expression of the makor and minor MMR proteins MLH1, MSH2, MSH6 and PMS2. MLH1:    RADIOGRAPHIC STUDIES: I have personally reviewed the radiological images as listed and agreed with the findings in the report.  Ct Abdomen Pelvis W Contrast 01/20/2015    IMPRESSION: Right lateral rectal wall exophytic mass likely corresponding to the known neoplasm.  Two ill-defined hepatic hypo enhancing lesions which appears to correspond to the lesions seen on the prior MRI. Please note evaluation of the liver is very limited due to streak artifact. If there is clinical concern for new metastatic disease MRI may provide better evaluation.  Small left ovarian cystic lesion corresponding to the lesion reported on the prior pelvic ultrasound. Ultrasound may provide better evaluation of the pelvic structures.    Electronically Signed   By: Anner Crete M.D.   On: 01/20/2015 16:15   Lower endoscopic ultrasound by Dr. Paulita Fujita 01/18/2015 FINDINGS: Normal digital rectal exam; could not palpate mass. In proximal rectum extending to the distal rectum, from 12 cm to 20 cm from the anal verge, a partial circumferential (50-75% circumferential distally, 75-100% circumferential proximall) mass was seen. Mass was firm, fixed, ulcerated, friable. EUS radial scope was able to traverse the tumor into normal-appearing distal sigmoid colon. Lesion invaded through the wall of the rectum into neighboring connective tissues in multiple regions. There were several round hypoechoic well-defined malignant-appearing lymph nodes in the vicinity of the proximal portion of the tumor. STAGING: T3 N2 Mx via rectal ultrasound ENDOSCOPIC IMPRESSION: As above. Rectosigmoid mass, biopsy-proven adenocarcinoma, with local invasion noted under ultrasound. RECOMMENDATIONS: 1. Watch for potential complications of procedure. 2. Needs CT chest, abdomen, pelvis to complete staging. 3. Based already on rectal ultrasound results, patient would not be candidate for upfront surgery. She will need surgical and radiation oncology consultations. 4. Will discuss with Dr. Amedeo Plenty.  CT chest 02/07/2015 IMPRESSION: 1. No findings of metastatic disease to the chest.  Abdomen MRI w and wo contrast 02/05/2015  IMPRESSION: 1. Small cavernous hemangiomas in segments 5 and 7 of the liver redemonstrated, as above. Additional 7 mm indeterminate lesion is also favored to represent a tiny cavernous hemangioma, but may alternatively represent a small cyst. No new suspicious appearing hepatic lesions to suggest presence of metastatic disease at this time. 2. Mild hepatic steatosis.   ASSESSMENT & PLAN: Ms. Opfer is a 49 year old female with chronic abdominal pain with rectal mass on colonoscopy found to be invasive  rectal adenocarcinoma.   1. T3N2M0,  Stage IIIC rectal adenocarcinoma, ypT1N0 after  Neoadjuvant chemotherapy and radiation -I reviewed her  Surgical pathology result in great detail with patient and her husband. She has had a good response to neoadjuvant chemoradiation, has a small residual tumor on the surgical specimen, no  Lymph node metastasis. -We discussed adjuvant chemotherapy, she is agreeable with FOLFOX. She has had port put in during her surgery.  -Chemotherapy consent: Side effects including but does not not limited to, fatigue, nausea, vomiting, diarrhea, hair loss, neuropathy, fluid retention, renal and kidney dysfunction, neutropenic fever, needed for blood transfusion, bleeding,  Coronary artery spasm and heart attack,were discussed with patient in great detail. She agrees to proceed. - she  Has not recovered well from her surgery, I will tentatively start her chemotherapy in 2 weeks if she recovers well.   2. Microcytic anemia, secondary to GI blood loss and iron deficiency anemia - Likely in the setting of ongoing blood loss from her malignancy.  -Her initial lab showed a ferritin of 28, serum iron 40, saturation 12%. Consistent with mild iron deficiency. She is taking ferrous sulfate once daily -her Hb is 8.3 today,  Ferritin is normal, serum iron and saturation is low. I'll set up IV Feraheme for her in the next few weeks - she is not very symptomatic, I'll hold off purchase patient will now -will continue monitoring   3. HTN, depression -She'll continue follow-up with her primary care physician  4. Abdominal pain,  Secondary to surgery - she will continue use Vicodin as needed  5.  Diarrhea - she'll continue using Imodium as needed.   Plan: - chemotherapy class and IV Feraheme in the next 1-2 weeks - start chemotherapy FOLFOX on 06/29/2015, I'll see her before the treatment   I spent 35 mins for the visit, >50% face to face discussion.   Truitt Merle  06/14/2015

## 2015-06-14 NOTE — Progress Notes (Signed)
Oncology Nurse Navigator Documentation  Oncology Nurse Navigator Flowsheets 06/14/2015  Referral date to RadOnc/MedOnc -  Navigator Encounter Type Post operative visit  Patient Visit Type Medonc  Treatment Phase Treatment-planning for adjuvant FOLFOX  Barriers/Navigation Needs Education  Education Pain/ Symptom Management;Preparing for Upcoming  Treatment  Interventions Referrals  Referrals Other--encouraged her to go to chemo class again since she has PAC, will be getting IV chemo and need pump instruction  Coordination of Care -  Education Method Verbal-improve nutrition by getting thrush resolved, small frequent meals, Boost or Ensure supplement. Take pain med to stay comfortable  Support Groups/Services GI  Time Spent with Patient 90  Made physician aware of suggestion by genetics counselor to be seen (she has no biological children w/no plans to have children).

## 2015-06-15 ENCOUNTER — Telehealth: Payer: Self-pay | Admitting: *Deleted

## 2015-06-15 ENCOUNTER — Telehealth: Payer: Self-pay | Admitting: Hematology

## 2015-06-15 NOTE — Telephone Encounter (Signed)
per pof to sch pt appt-cld & spoke to pt and adv of appt time & date

## 2015-06-15 NOTE — Telephone Encounter (Signed)
Per staff message and POF I have scheduled appts. Advised scheduler of appts. JMW  

## 2015-06-19 ENCOUNTER — Encounter: Payer: Self-pay | Admitting: Hematology

## 2015-06-19 ENCOUNTER — Other Ambulatory Visit: Payer: Self-pay | Admitting: Hematology

## 2015-06-22 ENCOUNTER — Ambulatory Visit (HOSPITAL_BASED_OUTPATIENT_CLINIC_OR_DEPARTMENT_OTHER): Payer: BLUE CROSS/BLUE SHIELD

## 2015-06-22 VITALS — BP 152/78 | HR 87 | Temp 98.9°F | Resp 18

## 2015-06-22 DIAGNOSIS — D500 Iron deficiency anemia secondary to blood loss (chronic): Secondary | ICD-10-CM

## 2015-06-22 DIAGNOSIS — K922 Gastrointestinal hemorrhage, unspecified: Secondary | ICD-10-CM

## 2015-06-22 DIAGNOSIS — D509 Iron deficiency anemia, unspecified: Secondary | ICD-10-CM

## 2015-06-22 MED ORDER — SODIUM CHLORIDE 0.9 % IV SOLN
Freq: Once | INTRAVENOUS | Status: AC
  Administered 2015-06-22: 14:00:00 via INTRAVENOUS

## 2015-06-22 MED ORDER — SODIUM CHLORIDE 0.9 % IV SOLN
510.0000 mg | Freq: Once | INTRAVENOUS | Status: AC
  Administered 2015-06-22: 510 mg via INTRAVENOUS
  Filled 2015-06-22: qty 17

## 2015-06-23 ENCOUNTER — Other Ambulatory Visit: Payer: Self-pay | Admitting: Surgery

## 2015-06-23 ENCOUNTER — Inpatient Hospital Stay (HOSPITAL_COMMUNITY)
Admission: AD | Admit: 2015-06-23 | Discharge: 2015-06-29 | DRG: 863 | Disposition: A | Discharge status: Home / Self Care | Payer: BLUE CROSS/BLUE SHIELD | Source: Ambulatory Visit | Attending: Surgery | Admitting: Surgery

## 2015-06-23 ENCOUNTER — Ambulatory Visit
Admission: RE | Admit: 2015-06-23 | Discharge: 2015-06-23 | Disposition: A | Discharge status: Home / Self Care | Payer: BLUE CROSS/BLUE SHIELD | Source: Ambulatory Visit | Attending: Surgery | Admitting: Surgery

## 2015-06-23 ENCOUNTER — Encounter (HOSPITAL_COMMUNITY): Payer: Self-pay | Admitting: *Deleted

## 2015-06-23 DIAGNOSIS — Z87440 Personal history of urinary (tract) infections: Secondary | ICD-10-CM

## 2015-06-23 DIAGNOSIS — E876 Hypokalemia: Secondary | ICD-10-CM | POA: Diagnosis present

## 2015-06-23 DIAGNOSIS — G8929 Other chronic pain: Secondary | ICD-10-CM | POA: Diagnosis present

## 2015-06-23 DIAGNOSIS — K6289 Other specified diseases of anus and rectum: Secondary | ICD-10-CM

## 2015-06-23 DIAGNOSIS — K6811 Postprocedural retroperitoneal abscess: Secondary | ICD-10-CM | POA: Diagnosis present

## 2015-06-23 DIAGNOSIS — R102 Pelvic and perineal pain: Secondary | ICD-10-CM

## 2015-06-23 DIAGNOSIS — G4730 Sleep apnea, unspecified: Secondary | ICD-10-CM | POA: Diagnosis present

## 2015-06-23 DIAGNOSIS — Z884 Allergy status to anesthetic agent status: Secondary | ICD-10-CM | POA: Diagnosis not present

## 2015-06-23 DIAGNOSIS — Z90710 Acquired absence of both cervix and uterus: Secondary | ICD-10-CM

## 2015-06-23 DIAGNOSIS — N739 Female pelvic inflammatory disease, unspecified: Secondary | ICD-10-CM

## 2015-06-23 DIAGNOSIS — Z87891 Personal history of nicotine dependence: Secondary | ICD-10-CM | POA: Diagnosis not present

## 2015-06-23 DIAGNOSIS — M179 Osteoarthritis of knee, unspecified: Secondary | ICD-10-CM | POA: Diagnosis present

## 2015-06-23 DIAGNOSIS — Z800 Family history of malignant neoplasm of digestive organs: Secondary | ICD-10-CM | POA: Diagnosis not present

## 2015-06-23 DIAGNOSIS — Z808 Family history of malignant neoplasm of other organs or systems: Secondary | ICD-10-CM | POA: Diagnosis not present

## 2015-06-23 DIAGNOSIS — D638 Anemia in other chronic diseases classified elsewhere: Secondary | ICD-10-CM | POA: Diagnosis present

## 2015-06-23 DIAGNOSIS — R630 Anorexia: Secondary | ICD-10-CM

## 2015-06-23 DIAGNOSIS — I341 Nonrheumatic mitral (valve) prolapse: Secondary | ICD-10-CM | POA: Diagnosis present

## 2015-06-23 DIAGNOSIS — E7800 Pure hypercholesterolemia, unspecified: Secondary | ICD-10-CM | POA: Diagnosis present

## 2015-06-23 DIAGNOSIS — D270 Benign neoplasm of right ovary: Secondary | ICD-10-CM | POA: Diagnosis present

## 2015-06-23 DIAGNOSIS — Z8249 Family history of ischemic heart disease and other diseases of the circulatory system: Secondary | ICD-10-CM | POA: Diagnosis not present

## 2015-06-23 DIAGNOSIS — E559 Vitamin D deficiency, unspecified: Secondary | ICD-10-CM | POA: Diagnosis present

## 2015-06-23 DIAGNOSIS — C20 Malignant neoplasm of rectum: Secondary | ICD-10-CM | POA: Diagnosis present

## 2015-06-23 DIAGNOSIS — F329 Major depressive disorder, single episode, unspecified: Secondary | ICD-10-CM | POA: Diagnosis present

## 2015-06-23 DIAGNOSIS — Z9221 Personal history of antineoplastic chemotherapy: Secondary | ICD-10-CM

## 2015-06-23 DIAGNOSIS — Z923 Personal history of irradiation: Secondary | ICD-10-CM

## 2015-06-23 DIAGNOSIS — Z882 Allergy status to sulfonamides status: Secondary | ICD-10-CM | POA: Diagnosis not present

## 2015-06-23 DIAGNOSIS — K219 Gastro-esophageal reflux disease without esophagitis: Secondary | ICD-10-CM | POA: Diagnosis present

## 2015-06-23 DIAGNOSIS — R627 Adult failure to thrive: Secondary | ICD-10-CM | POA: Diagnosis present

## 2015-06-23 DIAGNOSIS — Z880 Allergy status to penicillin: Secondary | ICD-10-CM | POA: Diagnosis not present

## 2015-06-23 DIAGNOSIS — Z833 Family history of diabetes mellitus: Secondary | ICD-10-CM | POA: Diagnosis not present

## 2015-06-23 DIAGNOSIS — K5090 Crohn's disease, unspecified, without complications: Secondary | ICD-10-CM | POA: Diagnosis present

## 2015-06-23 DIAGNOSIS — F32A Depression, unspecified: Secondary | ICD-10-CM | POA: Diagnosis present

## 2015-06-23 DIAGNOSIS — E440 Moderate protein-calorie malnutrition: Secondary | ICD-10-CM | POA: Diagnosis present

## 2015-06-23 DIAGNOSIS — Z85048 Personal history of other malignant neoplasm of rectum, rectosigmoid junction, and anus: Secondary | ICD-10-CM

## 2015-06-23 DIAGNOSIS — K589 Irritable bowel syndrome without diarrhea: Secondary | ICD-10-CM | POA: Diagnosis present

## 2015-06-23 DIAGNOSIS — I10 Essential (primary) hypertension: Secondary | ICD-10-CM | POA: Diagnosis present

## 2015-06-23 DIAGNOSIS — Z823 Family history of stroke: Secondary | ICD-10-CM | POA: Diagnosis not present

## 2015-06-23 DIAGNOSIS — Z6841 Body Mass Index (BMI) 40.0 and over, adult: Secondary | ICD-10-CM

## 2015-06-23 DIAGNOSIS — E66813 Obesity, class 3: Secondary | ICD-10-CM

## 2015-06-23 HISTORY — DX: Female pelvic inflammatory disease, unspecified: N73.9

## 2015-06-23 LAB — CBC
HEMATOCRIT: 24.7 % — AB (ref 36.0–46.0)
Hemoglobin: 7.4 g/dL — ABNORMAL LOW (ref 12.0–15.0)
MCH: 25.3 pg — ABNORMAL LOW (ref 26.0–34.0)
MCHC: 30 g/dL (ref 30.0–36.0)
MCV: 84.6 fL (ref 78.0–100.0)
PLATELETS: 421 10*3/uL — AB (ref 150–400)
RBC: 2.92 MIL/uL — ABNORMAL LOW (ref 3.87–5.11)
RDW: 17.1 % — AB (ref 11.5–15.5)
WBC: 8.2 10*3/uL (ref 4.0–10.5)

## 2015-06-23 LAB — CREATININE, SERUM
Creatinine, Ser: 0.62 mg/dL (ref 0.44–1.00)
GFR calc non Af Amer: >60 mL/min (ref >60)

## 2015-06-23 MED ORDER — ACETAMINOPHEN 325 MG PO TABS: 325.0000 mg | ORAL_TABLET | Freq: Four times a day (QID) | ORAL | Status: DC | PRN

## 2015-06-23 MED ORDER — ONDANSETRON HCL 4 MG/2ML IJ SOLN
4.0000 mg | Freq: Four times a day (QID) | INTRAMUSCULAR | Status: DC | PRN
  Administered 2015-06-26 – 2015-06-28 (×5): 4 mg via INTRAVENOUS
  Filled 2015-06-23 (×5): qty 2

## 2015-06-23 MED ORDER — SACCHAROMYCES BOULARDII 250 MG PO CAPS
250.0000 mg | ORAL_CAPSULE | Freq: Two times a day (BID) | ORAL | Status: DC
  Administered 2015-06-23 – 2015-06-29 (×11): 250 mg via ORAL
  Filled 2015-06-23 (×13): qty 1

## 2015-06-23 MED ORDER — ALUM & MAG HYDROXIDE-SIMETH 200-200-20 MG/5ML PO SUSP: 30.0000 mL | Freq: Four times a day (QID) | ORAL | Status: DC | PRN

## 2015-06-23 MED ORDER — LIP MEDEX EX OINT
1.0000 | TOPICAL_OINTMENT | Freq: Two times a day (BID) | CUTANEOUS | Status: DC
Start: 2015-06-23 — End: 2015-06-29
  Administered 2015-06-26 – 2015-06-28 (×3): 1 via TOPICAL
  Filled 2015-06-23 (×2): qty 7

## 2015-06-23 MED ORDER — MELOXICAM 15 MG PO TABS
15.0000 mg | ORAL_TABLET | Freq: Every day | ORAL | Status: DC
  Administered 2015-06-23 – 2015-06-29 (×7): 15 mg via ORAL
  Filled 2015-06-23 (×7): qty 1

## 2015-06-23 MED ORDER — SODIUM CHLORIDE 0.9 % IJ SOLN: 10.0000 mL | INTRAMUSCULAR | Status: DC | PRN

## 2015-06-23 MED ORDER — BISMUTH SUBSALICYLATE 262 MG/15ML PO SUSP
30.0000 mL | Freq: Three times a day (TID) | ORAL | Status: DC | PRN
  Filled 2015-06-23: qty 118

## 2015-06-23 MED ORDER — METHOCARBAMOL 1000 MG/10ML IJ SOLN
1000.0000 mg | Freq: Four times a day (QID) | INTRAVENOUS | Status: DC | PRN
  Filled 2015-06-23: qty 10

## 2015-06-23 MED ORDER — LACTATED RINGERS IV BOLUS (SEPSIS)
1000.0000 mL | Freq: Once | INTRAVENOUS | Status: AC
  Administered 2015-06-23: 1000 mL via INTRAVENOUS

## 2015-06-23 MED ORDER — LACTATED RINGERS IV BOLUS (SEPSIS): 1000.0000 mL | Freq: Three times a day (TID) | INTRAVENOUS | Status: AC | PRN

## 2015-06-23 MED ORDER — WITCH HAZEL-GLYCERIN EX PADS
1.0000 "application " | MEDICATED_PAD | CUTANEOUS | Status: DC | PRN
  Filled 2015-06-23: qty 100
  Filled 2015-06-23: qty 40

## 2015-06-23 MED ORDER — HYDROCODONE-ACETAMINOPHEN 10-325 MG PO TABS
1.0000 | ORAL_TABLET | ORAL | Status: DC | PRN
  Administered 2015-06-23 (×2): 2 via ORAL
  Administered 2015-06-25: 1 via ORAL
  Administered 2015-06-25 – 2015-06-26 (×5): 2 via ORAL
  Administered 2015-06-27: 1 via ORAL
  Administered 2015-06-27: 2 via ORAL
  Filled 2015-06-23: qty 2
  Filled 2015-06-23: qty 1
  Filled 2015-06-23 (×5): qty 2
  Filled 2015-06-23: qty 1
  Filled 2015-06-23 (×3): qty 2

## 2015-06-23 MED ORDER — NAPROXEN 500 MG PO TABS
500.0000 mg | ORAL_TABLET | Freq: Two times a day (BID) | ORAL | Status: DC | PRN
  Filled 2015-06-23: qty 1

## 2015-06-23 MED ORDER — ONDANSETRON 4 MG PO TBDP: 4.0000 mg | ORAL_TABLET | Freq: Four times a day (QID) | ORAL | Status: DC | PRN

## 2015-06-23 MED ORDER — ONDANSETRON HCL 4 MG PO TABS: 4.0000 mg | ORAL_TABLET | Freq: Three times a day (TID) | ORAL | Status: DC | PRN

## 2015-06-23 MED ORDER — OXYCODONE HCL 5 MG PO TABS: 5.0000 mg | ORAL_TABLET | ORAL | Status: DC | PRN

## 2015-06-23 MED ORDER — ALTEPLASE 2 MG IJ SOLR
2.0000 mg | Freq: Once | INTRAMUSCULAR | Status: AC
  Administered 2015-06-23: 2 mg
  Filled 2015-06-23: qty 2

## 2015-06-23 MED ORDER — PHENOL 1.4 % MT LIQD: 2.0000 | OROMUCOSAL | Status: DC | PRN

## 2015-06-23 MED ORDER — ACETAMINOPHEN 650 MG RE SUPP: 650.0000 mg | Freq: Four times a day (QID) | RECTAL | Status: DC | PRN

## 2015-06-23 MED ORDER — ACETAMINOPHEN 325 MG PO TABS: 650.0000 mg | ORAL_TABLET | Freq: Four times a day (QID) | ORAL | Status: DC | PRN

## 2015-06-23 MED ORDER — MAGIC MOUTHWASH
15.0000 mL | Freq: Four times a day (QID) | ORAL | Status: DC | PRN
  Filled 2015-06-23: qty 15

## 2015-06-23 MED ORDER — SODIUM CHLORIDE 0.9 % IJ SOLN
10.0000 mL | Freq: Two times a day (BID) | INTRAMUSCULAR | Status: DC
  Administered 2015-06-23 – 2015-06-26 (×2): 10 mL
  Administered 2015-06-28: 1 mL

## 2015-06-23 MED ORDER — METOPROLOL TARTRATE 1 MG/ML IV SOLN
5.0000 mg | Freq: Four times a day (QID) | INTRAVENOUS | Status: DC | PRN
  Filled 2015-06-23: qty 5

## 2015-06-23 MED ORDER — DIPHENHYDRAMINE HCL 50 MG/ML IJ SOLN: 12.5000 mg | Freq: Four times a day (QID) | INTRAMUSCULAR | Status: DC | PRN

## 2015-06-23 MED ORDER — NEBIVOLOL HCL 10 MG PO TABS
10.0000 mg | ORAL_TABLET | Freq: Every day | ORAL | Status: DC
  Administered 2015-06-23 – 2015-06-26 (×3): 10 mg via ORAL
  Filled 2015-06-23 (×7): qty 1

## 2015-06-23 MED ORDER — LACTATED RINGERS IV BOLUS (SEPSIS): 1000.0000 mL | Freq: Once | INTRAVENOUS | Status: DC

## 2015-06-23 MED ORDER — SODIUM CHLORIDE 0.9 % IV SOLN
8.0000 mg | Freq: Four times a day (QID) | INTRAVENOUS | Status: DC | PRN
  Filled 2015-06-23: qty 4

## 2015-06-23 MED ORDER — CIPROFLOXACIN IN D5W 400 MG/200ML IV SOLN
400.0000 mg | Freq: Two times a day (BID) | INTRAVENOUS | Status: DC
  Administered 2015-06-23 – 2015-06-27 (×8): 400 mg via INTRAVENOUS
  Filled 2015-06-23 (×8): qty 200

## 2015-06-23 MED ORDER — VITAMIN C 500 MG PO TABS
500.0000 mg | ORAL_TABLET | Freq: Two times a day (BID) | ORAL | Status: DC
  Administered 2015-06-23 – 2015-06-29 (×11): 500 mg via ORAL
  Filled 2015-06-23 (×13): qty 1

## 2015-06-23 MED ORDER — LOPERAMIDE HCL 2 MG PO CAPS: 2.0000 mg | ORAL_CAPSULE | Freq: Three times a day (TID) | ORAL | Status: DC | PRN

## 2015-06-23 MED ORDER — IOPAMIDOL (ISOVUE-300) INJECTION 61%
125.0000 mL | Freq: Once | INTRAVENOUS | Status: AC | PRN
  Administered 2015-06-23: 125 mL via INTRAVENOUS

## 2015-06-23 MED ORDER — HEPARIN SODIUM (PORCINE) 5000 UNIT/ML IJ SOLN
5000.0000 [IU] | Freq: Three times a day (TID) | INTRAMUSCULAR | Status: DC
  Administered 2015-06-24 – 2015-06-29 (×13): 5000 [IU] via SUBCUTANEOUS
  Filled 2015-06-23 (×17): qty 1

## 2015-06-23 MED ORDER — LACTATED RINGERS IV SOLN
INTRAVENOUS | Status: DC
Start: 2015-06-23 — End: 2015-06-29
  Administered 2015-06-23 – 2015-06-26 (×4): via INTRAVENOUS
  Administered 2015-06-26: 100 mL/h via INTRAVENOUS
  Administered 2015-06-27 – 2015-06-29 (×3): via INTRAVENOUS

## 2015-06-23 MED ORDER — HYDROMORPHONE HCL 1 MG/ML IJ SOLN
0.5000 mg | INTRAMUSCULAR | Status: DC | PRN
  Administered 2015-06-23 – 2015-06-25 (×10): 2 mg via INTRAVENOUS
  Administered 2015-06-26: 1 mg via INTRAVENOUS
  Filled 2015-06-23 (×4): qty 2
  Filled 2015-06-23: qty 1
  Filled 2015-06-23 (×6): qty 2

## 2015-06-23 MED ORDER — PROMETHAZINE HCL 25 MG/ML IJ SOLN
6.2500 mg | INTRAMUSCULAR | Status: DC | PRN
  Administered 2015-06-28: 12.5 mg via INTRAVENOUS
  Filled 2015-06-23: qty 1

## 2015-06-23 MED ORDER — MENTHOL 3 MG MT LOZG
1.0000 | LOZENGE | OROMUCOSAL | Status: DC | PRN
  Filled 2015-06-23: qty 9

## 2015-06-23 MED ORDER — PROMETHAZINE HCL 25 MG PO TABS: 25.0000 mg | ORAL_TABLET | Freq: Four times a day (QID) | ORAL | Status: DC | PRN

## 2015-06-23 MED ORDER — VITAMIN D (ERGOCALCIFEROL) 1.25 MG (50000 UNIT) PO CAPS
50000.0000 [IU] | ORAL_CAPSULE | ORAL | Status: DC
  Administered 2015-06-26: 50000 [IU] via ORAL
  Filled 2015-06-23 (×3): qty 1

## 2015-06-23 MED ORDER — ZOLPIDEM TARTRATE 5 MG PO TABS: 5.0000 mg | ORAL_TABLET | Freq: Every evening | ORAL | Status: DC | PRN

## 2015-06-23 MED ORDER — HYOSCYAMINE SULFATE ER 0.375 MG PO TB12
0.3750 mg | ORAL_TABLET | Freq: Two times a day (BID) | ORAL | Status: DC
  Administered 2015-06-23 – 2015-06-24 (×3): 0.375 mg via ORAL
  Filled 2015-06-23 (×5): qty 1

## 2015-06-23 MED ORDER — ROSUVASTATIN CALCIUM 5 MG PO TABS
5.0000 mg | ORAL_TABLET | Freq: Every day | ORAL | Status: DC
  Administered 2015-06-23 – 2015-06-28 (×6): 5 mg via ORAL
  Filled 2015-06-23 (×7): qty 1

## 2015-06-23 MED ORDER — LOPERAMIDE HCL 2 MG PO CAPS: 2.0000 mg | ORAL_CAPSULE | Freq: Every day | ORAL | Status: DC

## 2015-06-23 MED ORDER — METRONIDAZOLE IN NACL 5-0.79 MG/ML-% IV SOLN
500.0000 mg | Freq: Four times a day (QID) | INTRAVENOUS | Status: DC
  Administered 2015-06-23 – 2015-06-29 (×22): 500 mg via INTRAVENOUS
  Filled 2015-06-23 (×24): qty 100

## 2015-06-23 MED ORDER — DULOXETINE HCL 60 MG PO CPEP
60.0000 mg | ORAL_CAPSULE | Freq: Two times a day (BID) | ORAL | Status: DC
  Administered 2015-06-23 – 2015-06-29 (×11): 60 mg via ORAL
  Filled 2015-06-23 (×15): qty 1

## 2015-06-23 MED ORDER — LORAZEPAM 2 MG/ML IJ SOLN: 0.5000 mg | Freq: Three times a day (TID) | INTRAMUSCULAR | Status: DC | PRN

## 2015-06-23 MED ORDER — LOPERAMIDE HCL 2 MG PO CAPS
2.0000 mg | ORAL_CAPSULE | Freq: Two times a day (BID) | ORAL | Status: DC
  Administered 2015-06-23 – 2015-06-24 (×3): 2 mg via ORAL
  Filled 2015-06-23 (×5): qty 1

## 2015-06-23 MED ORDER — METHOCARBAMOL 500 MG PO TABS: 500.0000 mg | ORAL_TABLET | Freq: Four times a day (QID) | ORAL | Status: DC | PRN

## 2015-06-23 NOTE — H&P (Addendum)
[facility]  [address] [place], [place] [address] Phone: [phone] FAX: [place]  May 25, 1966 [id number]  CARE TEAM:  PCP: Vidal Schwalbe, MD  Outpatient Care Team: Patient Care Team: [name], MD as PCP - General (Family Medicine) Michael Boston, MD as Consulting Physician (General Surgery) Truitt Merle, MD as Consulting Physician (Medical Oncology) Kyung Rudd, MD as Consulting Physician (Radiation Oncology) Teena Irani, MD as Consulting Physician (Gastroenterology)  Inpatient Treatment Team: @RRTREATTEAM @  This patient is a 49 y.o.female who presents today for surgical evaluation.  Reason for evaluation: pelvic pain. Probable anastomotic leak  Morbidly obese female found to have bulky proximal/Mid rectal cancer.  T3N2 by ultrasound.  She underwent neoadjuvant chemoradiation therapy.  Underwent robotically assisted low anterior resection on 05/25/2015.  Initially she did well.  Discharged home postop day #4.  Then developed some diarrhea.  Anxiety.  Eventually improved with an antidiarrheal bowel regimen.  Appetite was fair but then improved.  She was gradually feeling better.  Then had discomfort again.  More pelvic pain and pressure.  Worsened past 24 hours.  Laboratory values underwhelming.  However CT scan shows gas in the pelvis suspicious for abscess and probable anastomotic leak.  Because of her failure to thrive, I Admitted her.  Husband at bedside.  Having fecal urgency and encopresis.  No incontinence.  Feeling soreness especially on her bottom.  No fevers or chills.  Still on chronic medications for pain and depression and anxiety    POST-OPERATIVE DIAGNOSIS:   Proximal Rectal Cancer  SURGERY: 05/25/2015  XI ROBOTIC ASSISTED RECTOSIGMOID LOWER ANTERIOR RESECTION Laparoscopic and robotic lysis of adhesions X 60 MINUTES RIGID PROCTOSCOPY RIGHT SALPINGO-OOPHORECTOMY INSERTION  PORT-A-CATH in R IJ  SURGEON:   Surgeon(s): Michael Boston, MD Leighton Ruff, MD  OR FINDINGS:   Patient had a bulky tumor in proximal and mid rectum. Most of it focused on the right anterolateral circumference. Still came down to around 9 cm from the anal verge.  I enlarged right ovary was some firm and mainly cystic areas. At least 6 cm in largest dimension. Left side looked atrophic and normal. Right ovary removed.  PowerPort is a ClearVUE 8 [place]. Reference #[id number] Lot number [id number] It is in the right internal jugular vein. The tip sits at the right atrial/superior vena caval junction  No obvious metastatic disease on visceral parietal peritoneum or liver.  The anastomosis rests 5-6 cm from the anal verge by rigid proctoscopy  Diagnosis 1. Colon, segmental resection for tumor, recto-sigmoid INVASIVE COLONIC ADENOCARCINOMA (0.2 CM IN GREATEST DIMENSION RESIDUAL TUMOR) THE TUMOR INVADE SUBMUCOSA (0.3 CM IN DEPTH, PT1) MARGINS OF RESECTION ARE NEGATIVE FOR TUMOR TWELVE BENIGN LYMPH NODES (0/12) 2. Colon, resection margin (donut), proximal anastomotic ring BENIGN COLONIC TISSUE, NEGATIVE FOR MALIGANCY 3. Colon, resection margin (donut), distal anastomotic ring BENIGN COLONIC TISSUE, NEGATIVE FOR MALIGNANCY 4. Ovary, right BENIGN SEROUS CYSTADENOMA Microscopic Comment 1. COLON AND RECTUM (INCLUDING TRANS-ANAL RESECTION): Specimen: Colon-rectum Procedure: Segmental resection Tumor site: Rectum anterior wall mid to distal third of rectum Specimen integrity: intact Macroscopic intactness of mesorectum: Complete: _ Macroscopic tumor perforation: Negative Invasive tumor: Maximum size: Histologic type(s): G2 Histologic grade and differentiation: G1: well differentiated/low grade G2: moderately differentiated/low grade G3: poorly differentiated/high grade G4: undifferentiated/high grade Type of polyp in which invasive carcinoma arose: _ 1 of 3 Duplicate  copy FINAL for [place], [place] N [phone]) Microscopic Comment(continued) Microscopic extension of invasive tumor:Submucosa  Lymph-Vascular invasion: Not identified Peri-neural invasion: negative Tumor deposit(s) (discontinuous extramural extension): Negative Resection margins: Proximal margin: Negative Distal margin: Negative Circumferential (radial) (posterior ascending, posterior descending; lateral and posterior mid-rectum; and entire lower 1/3 rectum):Negatvie Mesenteric margin (sigmoid and transverse): Negative Distance closest margin (if all above margins negative): 2.5 cm Treatment effect (neo-adjuvant therapy): Present Additional polyp(s): Negative Non-neoplastic findings: Unremarkable Lymph nodes: number examined 12; number positive: 0 Pathologic Staging: T1, N0, M_ Ancillary studies: Preserved expression of the makor and minor MMR proteins MLH1, MSH2, MSH6 and PMS2. MLH1: Casimer Lanius MD Pathologist, Electronic Signature (Case signed 05/31/2015) Specimen Fenris Cauble and Clinical Information Specimen(s) Obtained: 1. Colon, segmental resection for tumor, recto-sigmoid 2. Colon, resection margin (donut), proximal anastomotic ring 3. Colon, resection margin (donut), distal anastomotic ring 4. Ovary, right Specimen Clinical Information 1. proximal rectal cancer [rd] [facility] 1. Specimen: Rectosigmoid, to include at least portion of distal 1/3 of rectum. Specimen integrity: Intact Specimen length: 38 cm. 11 cm from sigmoid rectal junction to distal margin, and 4 cm from peritoneal reflection to distal margin. Mesorectal intactness: Complete Tumor location: Anterior wall, middle third to distal third of rectum Tumor size: There is a 4 cm in length and 2.2 cm in diameter area of healing ulceration with underlying indurated tan white dense tissue up to 1.1 cm thick. A discrete mass is not identified. Percent of bowel circumference involved: 40% Tumor distance to  margins: Proximal: 28 cm Distal: 6 cm Radial (posterior ascending, posterior descending; lateral and posterior mid-rectum; and entire lower 1/3 rectum): The area of dense indurated tissue is 2.5 cm from the perirectal soft tissue margin. 2 of 3 Duplicate copy FINAL for FELESHIA, ZUNDEL [phone]) Amelda Hapke(continued) Macroscopic extent of tumor invasion: No definite mass to evaluate invasion Total presumed lymph nodes: Found are seventeen possible lymph nodes ranging from 0.4 to 1.3 cm Extramural satellite tumor nodules: None Mucosal polyp(s): None Additional findings: None Block summary: A = proximal margin B = distal margin C-F = sections of ulceration and underlying indurated tissue G-J = ulceration and underlying indurated tissue, with nearest serosa/peritoneal reflection. K = perirectal soft tissue margin nearest indurated tissue L = four possible nodes, whole M = four possible nodes, whole N = four possible nodes, whole O = four possible node, whole P = one node bisected Total = sixteen blocks. 2. Received in formalin is a 2 cm in diameter and 1 cm thick ring of tan to hyperemic smooth mucosa with underlying tissue with embedded suture material. Representative sections are submitted in one block. 3. Received in formalin is a 1.4 cm in diameter and up to 1 cm thick ring of tan to hyperemic smooth mucosa and underlying tissue with embedded metallic staples. Representative sections submitted in one block. 4. Received in formalin is a 5.1 x 4.4 x 3.4 cm ovary, clinically right, which has a tan pink to dark red exterior with scattered adhesions. There are also portions of attached tissue consistent with fimbria, however definite tube is not identified. On sectioning, there are several serous and mucoid filled cysts which are up to 4 cm in greatest dimension, and have smooth inner surfaces with no areas of excrescences. Tissue adjacent to the largest cyst is focally tan white, firm  and dense. Representative sections are submitted in five blocks. (SSW:kh 05-26-15) Report signed out from the following location(s) Technical component and interpretation was performed at [facility], [place], [place] [address]. CLIA #: [id number], 3 of 3 Duplicate copy  Past  Medical History  Diagnosis Date  . Hypertension   . Hypercholesteremia   . Obesity   . Tear of left rotator cuff   . IBS (irritable bowel syndrome)   . Vertigo   . TMJ (dislocation of temporomandibular joint)   . Bulging disc     L5  . Depression   . Rectal bleeding   . Vitamin D deficiency   . Osteoarthritis, knee   . S/P radiation therapy 02/20/15-03/30/15    colon/rectal  . Complication of anesthesia   . PONV (postoperative nausea and vomiting)   . History of mitral valve prolapse   . Mild sleep apnea     does not use CPAP  . History of chemotherapy   . Numbness and tingling     great toe bilat left more than right   . History of frequent ear infections   . History of frequent urinary tract infections     middle school age  . GERD (gastroesophageal reflux disease)   . Cancer ([place])   . Anemia   . History of blood transfusion   . Fibroids     Past Surgical History  Procedure Laterality Date  . Rotator cuff repair    . Eus N/A 01/18/2015    Procedure: LOWER ENDOSCOPIC ULTRASOUND (EUS);  Surgeon: Arta Silence, MD;  Location: [facility];  Service: Endoscopy;  Laterality: N/A;  . Abdominal hysterectomy  1996    Removed uterus, and tubes  . Left knee arthroscopy      mult  . Tonsillectomy    . Dilation and curettage of uterus      times 2  . Xi robotic assisted lower anterior resection N/A 05/25/2015    Procedure: XI ROBOTIC ASSISTED LOWER ANTERIOR RESECTION, , RIGID PROCTOSCOPY, RIGHT OOPHORECTOMY;  Surgeon: Michael Boston, MD;  Location: [facility];  Service: General;  Laterality: N/A;  . Portacath placement N/A 05/25/2015    Procedure: INSERTION PORT-A-CATH;   Surgeon: Michael Boston, MD;  Location: [facility];  Service: General;  Laterality: N/A;    Social History   Social History  . Marital Status: Married    Spouse Name: N/A  . Number of Children: N/A  . Years of Education: N/A   Occupational History  . Not on file.   Social History Main Topics  . Smoking status: Former Smoker -- 0.50 packs/day for 4 years    Types: Cigarettes    Quit date: 07/15/2000  . Smokeless tobacco: Never Used     Comment: 1 pack/week intermittently x 7 years  . Alcohol Use: 0.6 oz/week    1 Shots of liquor per week     Comment: rarely   . Drug Use: No  . Sexual Activity: Not on file   Other Topics Concern  . Not on file   Social History Narrative   Tobacco Use: Cigarettes - Former Smoker   Alcohol: Yes, very rare, liquor.    No recreational drug use   Occupation: Head CMA @ [employer]   Marital Status: Married    Husband: Roselyn Reef Disabled   Children: 2 adopted kids [place]   Religion: [employer] in [place]                   Family History  Problem Relation Age of Onset  . Coronary artery disease Mother 90  . Hypertension Mother   . Hyperlipidemia Mother   . Diabetes Mellitus I Mother   . Coronary artery disease Father   . Hyperlipidemia  Father   . Hypertension Father   . Cancer Sister     skin - non melanoma  . Hyperlipidemia Brother   . Cancer Maternal Uncle 63    pancreatic with mets to colon and prostate  . Cirrhosis Maternal Uncle   . Hypertension Maternal Grandmother   . Diabetes Mellitus I Maternal Grandmother   . Hyperlipidemia Maternal Grandmother   . CVA Maternal Grandmother   . Hypertension Maternal Grandfather   . Coronary artery disease Maternal Grandfather 65  . Hyperlipidemia Maternal Grandfather   . Coronary artery disease Paternal Grandmother   . Hypertension Paternal Grandmother   . Hyperlipidemia Paternal Grandmother   . Diabetes Mellitus I Paternal Grandmother   . Hypertension Paternal  Grandfather   . Hyperlipidemia Paternal Grandfather   . Coronary artery disease Paternal Grandfather     Current Facility-Administered Medications  Medication Dose Route Frequency Provider Last Rate Last Dose  . acetaminophen (TYLENOL) tablet 650 mg  650 mg Oral Q6H PRN Michael Boston, MD       Or  . acetaminophen (TYLENOL) suppository 650 mg  650 mg Rectal Q6H PRN Michael Boston, MD      . alteplase (CATHFLO ACTIVASE) injection 2 mg  2 mg Intracatheter Once Michael Boston, MD      . alum & mag hydroxide-simeth (MAALOX/MYLANTA) 200-200-20 MG/5ML suspension 30 mL  30 mL Oral Q6H PRN Michael Boston, MD      . ciprofloxacin (CIPRO) IVPB 400 mg  400 mg Intravenous Q12H Michael Boston, MD      . diphenhydrAMINE (BENADRYL) injection 12.5-25 mg  12.5-25 mg Intravenous Q6H PRN Michael Boston, MD      . Derrill Memo ON 06/24/2015] heparin injection 5,000 Units  5,000 Units Subcutaneous 3 times per day Michael Boston, MD      . HYDROmorphone (DILAUDID) injection 0.5-2 mg  0.5-2 mg Intravenous Q2H PRN Michael Boston, MD      . lactated ringers bolus 1,000 mL  1,000 mL Intravenous Once Michael Boston, MD      . lactated ringers bolus 1,000 mL  1,000 mL Intravenous Q8H PRN Michael Boston, MD      . lactated ringers infusion   Intravenous Continuous Michael Boston, MD      . lip balm (CARMEX) ointment 1 application  1 application Topical BID Michael Boston, MD      . LORazepam (ATIVAN) injection 0.5-1 mg  0.5-1 mg Intravenous Q8H PRN Michael Boston, MD      . magic mouthwash  15 mL Oral QID PRN Michael Boston, MD      . menthol-cetylpyridinium (CEPACOL) lozenge 3 mg  1 lozenge Oral PRN Michael Boston, MD      . methocarbamol (ROBAXIN) 1,000 mg in dextrose 5 % 50 mL IVPB  1,000 mg Intravenous Q6H PRN Michael Boston, MD      . metoprolol (LOPRESSOR) injection 5 mg  5 mg Intravenous Q6H PRN Michael Boston, MD      . metroNIDAZOLE (FLAGYL) IVPB 500 mg  500 mg Intravenous Q6H Michael Boston, MD      . naproxen (NAPROSYN) tablet 500 mg  500 mg Oral  Q12H PRN Michael Boston, MD      . ondansetron [facility]) injection 4 mg  4 mg Intravenous Q6H PRN Michael Boston, MD       Or  . ondansetron (ZOFRAN) 8 mg in sodium chloride 0.9 % 50 mL IVPB  8 mg Intravenous Q6H PRN Michael Boston, MD      . ondansetron (ZOFRAN-ODT) disintegrating  tablet 4-8 mg  4-8 mg Oral Q6H PRN Michael Boston, MD      . oxyCODONE (Oxy IR/ROXICODONE) immediate release tablet 5-10 mg  5-10 mg Oral Q4H PRN Michael Boston, MD      . phenol (CHLORASEPTIC) mouth spray 2 spray  2 spray Mouth/Throat PRN Michael Boston, MD      . promethazine (PHENERGAN) injection 6.25-12.5 mg  6.25-12.5 mg Intravenous Q4H PRN Michael Boston, MD      . saccharomyces boulardii (FLORASTOR) capsule 250 mg  250 mg Oral BID Michael Boston, MD      . vitamin C (ASCORBIC ACID) tablet 500 mg  500 mg Oral BID Michael Boston, MD      . zolpidem (AMBIEN) tablet 5 mg  5 mg Oral QHS PRN Michael Boston, MD         Allergies  Allergen Reactions  . Caine-1 [Lidocaine] Rash    Eyes swell shut; includes all caine drugs except marcaine  . Penicillins Rash    Has patient had a PCN reaction causing immediate rash, facial/tongue/throat swelling, SOB or lightheadedness with hypotension: no Has patient had a PCN reaction causing severe rash involving mucus membranes or skin necrosis: no Has patient had a PCN reaction that required hospitalization no Has patient had a PCN reaction occurring within the last 10 years: no If all of the above answers are "NO", then may proceed with Cephalosporin use.   . Sulfa Antibiotics Rash    Rash & Vomiting    ROS: Constitutional:  No fevers, chills, sweats.  Weight stable Eyes:  No vision changes, No discharge HENT:  No sore throats, nasal drainage Lymph: No neck swelling, No bruising easily Pulmonary:  No cough, productive sputum CV: No orthopnea, PND  Patient walks 20 minutes for about 1 miles without difficulty.  No exertional chest/neck/shoulder/arm pain. GI: No personal nor family history  of GI/colon cancer, inflammatory bowel disease, irritable bowel syndrome, allergy such as Celiac Sprue, dietary/dairy problems, colitis, ulcers nor gastritis.  No recent sick contacts/gastroenteritis.  No travel outside the country.  No changes in diet. Renal: No UTIs, No hematuria Genital:  No drainage, bleeding, masses Musculoskeletal: No severe joint pain.  Good ROM major joints Skin:  No sores or lesions.  No rashes Heme/Lymph:  No easy bleeding.  No swollen lymph nodes Neuro: No focal weakness/numbness.  No seizures Psych: No suicidal ideation.  No hallucinations  BP 87/58 mmHg  Pulse 80  Temp(Src) 99.6 F (37.6 C) (Oral)  Resp 18  SpO2 100%  Physical Exam: General: Pt awake/alert/oriented x4 in moderate acute distress.  Not toxic.  Looks a little sickly Eyes: PERRL, normal EOM. Sclera nonicteric Neuro: CN II-XII intact w/o focal sensory/motor deficits. Lymph: No head/neck/groin lymphadenopathy Psych:  No delerium/psychosis/paranoia.  Anxious.  Rest less chronically shaking her legs.  Consolable HENT: Normocephalic, Mucus membranes moist.  No thrush Neck: Supple, No tracheal deviation Chest: No pain.  Good respiratory excursion. CV:  Pulses intact.  Regular rhythm Abdomen: Soft, Nondistended.  Morbidly obese.  Incisions well-healed.  No cellulitis or infection at extraction incision.  Mild discomfort left suprapubic region.  No incarcerated hernias. Normal external genitalia.  No vaginal bleeding or discharge.  No hernias. Rectal:.  Perianal region sore.  Perianal irritation but no major pruritus Ext:  SCDs BLE.  No significant edema.  No cyanosis Skin: No petechiae / purpurea.  No major sores Musculoskeletal: No severe joint pain.  Good ROM major joints   Results:   Labs: No results found  for this or any previous visit (from the past 48 hour(s)).  Imaging / Studies: Ct Abdomen Pelvis W Contrast  06/23/2015  CLINICAL DATA:  Rectosigmoid colon resection for rectal cancer  on 05/25/2015. Rectal pain and bleeding for 2 weeks. Assess for possible abscess. EXAM: CT ABDOMEN AND PELVIS WITH CONTRAST TECHNIQUE: Multidetector CT imaging of the abdomen and pelvis was performed using the standard protocol following bolus administration of intravenous contrast. CONTRAST:  138m ISOVUE-300 IOPAMIDOL (ISOVUE-300) INJECTION 61% COMPARISON:  MR abdomen 02/05/2015.  CT abdomen 01/20/2015 FINDINGS: Lung bases are clear. No pleural or pericardial fluid. Known hemangiomas are again demonstrated in the liver, unchanged from the previous studies. No evidence of hepatic metastatic disease. No calcified gallstones. The spleen is normal. The pancreas is normal. The adrenal glands are normal. The kidneys are normal. The aorta and IVC are normal. No retroperitoneal mass or lymphadenopathy in the region of the abdomen. No acute bowel pathology in the region of the abdomen. In the pelvis, the patient has had previous hysterectomy. There has been recent rectosigmoid surgery with loss of the fat plane between the rectosigmoid colon in the sacrum/coccyx. There is stool material within the rectum. It appears that there is a direct reanastomosis. There is an unusual air cavity in that region raising the possibility of dehiscence at the anastomosis with some contained air/gas. I cannot be completely sure that there is not an extraluminal abscess, and am suspicious of that. It may be necessary to perform a water soluble contrast study to completely understand this anatomy. IMPRESSION: Postoperative changes at the rectosigmoid region. Worrisome gas collection with unusual characteristics that suggests the presence of dehiscence of the anastomosis with a contained rupture. Possible abscess in that region, as it is difficult to define the exact margins of the lumen of the rectosigmoid. I am in the process of calling these results. Electronically Signed   By: MNelson ChimesM.D.   On: 06/23/2015 14:53   Dg Chest Port 1  View  05/25/2015  CLINICAL DATA:  Status post Port-A-Cath placement EXAM: PORTABLE CHEST 1 VIEW COMPARISON:  CT chest dated 02/07/2015 FINDINGS: Right chest port terminates at the cavoatrial junction. Low lung volumes. Bilateral upper lobe and left lower lobe opacities, possibly atelectasis. No pleural effusion or pneumothorax. The heart is top-normal in size for inspiration. IMPRESSION: Right chest port terminates at the cavoatrial junction. Electronically Signed   By: SJulian HyM.D.   On: 05/25/2015 15:34   Dg C-arm 1-60 Min-no Report  05/25/2015  CLINICAL DATA: surg C-ARM 1-60 MINUTES Fluoroscopy was utilized by the requesting physician.  No radiographic interpretation.    Medications / Allergies: per chart  Antibiotics: Anti-infectives    Start     Dose/Rate Route Frequency Ordered Stop   06/23/15 1800  ciprofloxacin (CIPRO) IVPB 400 mg     400 mg 200 mL/hr over 60 Minutes Intravenous Every 12 hours 06/23/15 1647     06/23/15 1800  metroNIDAZOLE (FLAGYL) IVPB 500 mg     500 mg 100 mL/hr over 60 Minutes Intravenous Every 6 hours 06/23/15 1647        Assessment  Lakira N Diantonio  49y.o. female  @RRDAYSPOSTSURGERY @    Problem List:  Principal Problem:   Pelvic abscess in female Active Problems:   Morbid obesity with BMI of 50.0-59.9, adult ([place]   Hypertension   Depression   Failure to thrive with worsening pain and diarrhea and probable pelvic abscess.  Probable anastomotic leak contained.  Plan:  -Admit.  -  IV fluids.  -IV antibiotics.  We will start with Cipro and metronidazole given her penicillin allergy  -Antidiarrheal regimen.  Palliated perianal discomfort.  Sitz baths.  Topical agents.  Contrast enema to evaluate anastomosis and confirm suspicion of leak.    Percutaneous drain placement of a pelvic fluid collection dry and diverting control since now 3 weeks out from surgery.  Try to hold off on proximal ileal loop diversion given her massive  abdominal wall.  Would not do well with loop ileostomy.  -VTE prophylaxis- SCDs, etc.  -mobilize as tolerated to help recovery    [name], M.D., F.A.C.S. Gastrointestinal and Minimally Invasive Surgery [facility] [address] [place], [place] [address] [phone] Main / Paging   06/23/2015  Note: Portions of this report may have been transcribed using voice recognition software. Every effort was made to ensure accuracy; however, inadvertent computerized transcription errors may be present.   Any transcriptional errors that result from this process are unintentional.

## 2015-06-23 NOTE — Progress Notes (Deleted)
ANTIBIOTIC CONSULT NOTE - INITIAL  Pharmacy Consult for Zosyn Indication: presumed abdominal infection  Allergies  Allergen Reactions  . Caine-1 [Lidocaine] Rash    Eyes swell shut; includes all caine drugs except marcaine  . Penicillins Rash    Has patient had a PCN reaction causing immediate rash, facial/tongue/throat swelling, SOB or lightheadedness with hypotension: no Has patient had a PCN reaction causing severe rash involving mucus membranes or skin necrosis: no Has patient had a PCN reaction that required hospitalization no Has patient had a PCN reaction occurring within the last 10 years: no If all of the above answers are "NO", then may proceed with Cephalosporin use.   . Sulfa Antibiotics Rash    Rash & Vomiting    Patient Measurements:     Vital Signs:   Intake/Output from previous day:   Intake/Output from this shift:    Labs: No results for input(s): WBC, HGB, PLT, LABCREA, CREATININE in the last 72 hours. Estimated Creatinine Clearance: 138.6 mL/min (by C-G formula based on Cr of 0.7). No results for input(s): VANCOTROUGH, VANCOPEAK, VANCORANDOM, GENTTROUGH, GENTPEAK, GENTRANDOM, TOBRATROUGH, TOBRAPEAK, TOBRARND, AMIKACINPEAK, AMIKACINTROU, AMIKACIN in the last 72 hours.   Microbiology: No results found for this or any previous visit (from the past 720 hour(s)).  Medical History: Past Medical History  Diagnosis Date  . Hypertension   . Hypercholesteremia   . Obesity   . Tear of left rotator cuff   . IBS (irritable bowel syndrome)   . Vertigo   . TMJ (dislocation of temporomandibular joint)   . Bulging disc     L5  . Depression   . Rectal bleeding   . Vitamin D deficiency   . Osteoarthritis, knee   . S/P radiation therapy 02/20/15-03/30/15    colon/rectal  . Complication of anesthesia   . PONV (postoperative nausea and vomiting)   . History of mitral valve prolapse   . Mild sleep apnea     does not use CPAP  . History of chemotherapy   .  Numbness and tingling     great toe bilat left more than right   . History of frequent ear infections   . History of frequent urinary tract infections     middle school age  . GERD (gastroesophageal reflux disease)   . Cancer ([place])   . Anemia   . History of blood transfusion   . Fibroids     Medications:  Scheduled:  . [START ON 06/24/2015] heparin  5,000 Units Subcutaneous 3 times per day  . lactated ringers  1,000 mL Intravenous Once  . lip balm  1 application Topical BID  . saccharomyces boulardii  250 mg Oral BID  . vitamin C  500 mg Oral BID   Infusions:  . lactated ringers     Assessment: 49 yo female found to have rectal cancer now s/p neoadjuvant chemoXRT and s/p resection. Presents with abdominal discomfort with CT showing gas and possible abscess vs perforation. To start Zosyn per pharmacy dosing  Goal of Therapy:  treatment of infection  Plan:  Zosyn 3.375g IV q8 (extended interval infusion) Will follow up lab results if dose changes needed   Adrian Saran, PharmD, BCPS Pager [phone] 06/23/2015 4:22 PM

## 2015-06-24 ENCOUNTER — Encounter (HOSPITAL_COMMUNITY): Payer: Self-pay | Admitting: Radiology

## 2015-06-24 ENCOUNTER — Inpatient Hospital Stay (HOSPITAL_COMMUNITY): Payer: BLUE CROSS/BLUE SHIELD

## 2015-06-24 LAB — BASIC METABOLIC PANEL
ANION GAP: 8 (ref 5–15)
BUN: 8 mg/dL (ref 6–20)
CO2: 25 mmol/L (ref 22–32)
Calcium: 8.8 mg/dL — ABNORMAL LOW (ref 8.9–10.3)
Chloride: 99 mmol/L — ABNORMAL LOW (ref 101–111)
Creatinine, Ser: 0.59 mg/dL (ref 0.44–1.00)
GFR calc Af Amer: >60 mL/min (ref >60)
Glucose, Bld: 94 mg/dL (ref 65–99)
POTASSIUM: 3.2 mmol/L — AB (ref 3.5–5.1)
SODIUM: 132 mmol/L — AB (ref 135–145)

## 2015-06-24 LAB — CBC
HEMATOCRIT: 21.5 % — AB (ref 36.0–46.0)
HEMOGLOBIN: 6.5 g/dL — AB (ref 12.0–15.0)
MCH: 25.7 pg — ABNORMAL LOW (ref 26.0–34.0)
MCHC: 30.2 g/dL (ref 30.0–36.0)
MCV: 85 fL (ref 78.0–100.0)
Platelets: 362 10*3/uL (ref 150–400)
RBC: 2.53 MIL/uL — ABNORMAL LOW (ref 3.87–5.11)
RDW: 17.2 % — AB (ref 11.5–15.5)
WBC: 6.3 10*3/uL (ref 4.0–10.5)

## 2015-06-24 LAB — PROTIME-INR
INR: 1.22 (ref 0.00–1.49)
PROTHROMBIN TIME: 15.6 s — AB (ref 11.6–15.2)

## 2015-06-24 LAB — MAGNESIUM: Magnesium: 1.8 mg/dL (ref 1.7–2.4)

## 2015-06-24 MED ORDER — FENTANYL CITRATE (PF) 100 MCG/2ML IJ SOLN
INTRAMUSCULAR | Status: AC
  Filled 2015-06-24: qty 4

## 2015-06-24 MED ORDER — FENTANYL CITRATE (PF) 100 MCG/2ML IJ SOLN
INTRAMUSCULAR | Status: AC | PRN
  Administered 2015-06-24 (×2): 50 ug via INTRAVENOUS

## 2015-06-24 MED ORDER — MIDAZOLAM HCL 2 MG/2ML IJ SOLN
INTRAMUSCULAR | Status: AC
  Filled 2015-06-24: qty 8

## 2015-06-24 MED ORDER — MIDAZOLAM HCL 2 MG/2ML IJ SOLN
INTRAMUSCULAR | Status: AC | PRN
  Administered 2015-06-24 (×6): 1 mg via INTRAVENOUS

## 2015-06-24 MED ORDER — CHLOROPROCAINE HCL 1 % IJ SOLN
30.0000 mL | Freq: Once | INTRAMUSCULAR | Status: DC
  Filled 2015-06-24: qty 30

## 2015-06-24 MED ORDER — POTASSIUM CHLORIDE 10 MEQ/50ML IV SOLN
10.0000 meq | INTRAVENOUS | Status: AC
  Administered 2015-06-24 (×4): 10 meq via INTRAVENOUS
  Filled 2015-06-24 (×4): qty 50

## 2015-06-24 MED ORDER — BUPIVACAINE HCL 0.5 % IJ SOLN
50.0000 mL | Freq: Once | INTRAMUSCULAR | Status: DC
  Filled 2015-06-24: qty 50

## 2015-06-24 NOTE — Progress Notes (Signed)
Chief Complaint: Patient was seen in consultation today for pelvic abscess at the request o Dr. Johney Maine  Referring Physician(s): Dr. Johney Maine  History of Present Illness: Lindsay Little is a 49 y.o. female who has mid-rectal cancer. She underwent neoadjuvant chemoradiation therapy and then Low anterior resection on 11/10. Did well for a bit then developed abd pain and pressure. CT scan yesterday shows pelvic abscess with concern for anastomotic leak. She has been admitted and IR is asked to eval for drainage of abscess. She is to have barium enema this am to confirm leak, and then percutaneous drainage. Chart, PMHx, meds, labs, imaging reviewed.  Past Medical History  Diagnosis Date  . Hypertension   . Hypercholesteremia   . Obesity   . Tear of left rotator cuff   . IBS (irritable bowel syndrome)   . Vertigo   . TMJ (dislocation of temporomandibular joint)   . Bulging disc     L5  . Depression   . Rectal bleeding   . Vitamin D deficiency   . Osteoarthritis, knee   . S/P radiation therapy 02/20/15-03/30/15    colon/rectal  . Complication of anesthesia   . PONV (postoperative nausea and vomiting)   . History of mitral valve prolapse   . Mild sleep apnea     does not use CPAP  . History of chemotherapy   . Numbness and tingling     great toe bilat left more than right   . History of frequent ear infections   . History of frequent urinary tract infections     middle school age  . GERD (gastroesophageal reflux disease)   . Cancer ([place])   . Anemia   . History of blood transfusion   . Fibroids     Past Surgical History  Procedure Laterality Date  . Rotator cuff repair    . Eus N/A 01/18/2015    Procedure: LOWER ENDOSCOPIC ULTRASOUND (EUS);  Surgeon: Arta Silence, MD;  Location: [facility];  Service: Endoscopy;  Laterality: N/A;  . Abdominal hysterectomy  1996    Removed uterus, and tubes  . Left knee arthroscopy      mult  . Tonsillectomy    . Dilation and  curettage of uterus      times 2  . Xi robotic assisted lower anterior resection N/A 05/25/2015    Procedure: XI ROBOTIC ASSISTED LOWER ANTERIOR RESECTION, , RIGID PROCTOSCOPY, RIGHT OOPHORECTOMY;  Surgeon: Michael Boston, MD;  Location: [facility];  Service: General;  Laterality: N/A;  . Portacath placement N/A 05/25/2015    Procedure: INSERTION PORT-A-CATH;  Surgeon: Michael Boston, MD;  Location: [facility];  Service: General;  Laterality: N/A;    Allergies: Caine-1; Penicillins; and Sulfa antibiotics  Medications:  Current facility-administered medications:  .  acetaminophen (TYLENOL) tablet 325-650 mg, 325-650 mg, Oral, Q6H PRN, Michael Boston, MD .  alum & mag hydroxide-simeth (MAALOX/MYLANTA) 200-200-20 MG/5ML suspension 30 mL, 30 mL, Oral, Q6H PRN, Michael Boston, MD .  bismuth subsalicylate (PEPTO BISMOL) 262 MG/15ML suspension 30 mL, 30 mL, Oral, Q8H PRN, Michael Boston, MD .  ciprofloxacin (CIPRO) IVPB 400 mg, 400 mg, Intravenous, Q12H, Michael Boston, MD, 400 mg at 06/24/15 0604 .  diphenhydrAMINE (BENADRYL) injection 12.5-25 mg, 12.5-25 mg, Intravenous, Q6H PRN, Michael Boston, MD .  DULoxetine (CYMBALTA) DR capsule 60 mg, 60 mg, Oral, BID, Michael Boston, MD, 60 mg at 06/23/15 2238 .  heparin injection 5,000 Units, 5,000 Units, Subcutaneous, 3 times per day, Michael Boston, MD .  HYDROcodone-acetaminophen (NORCO) 10-325 MG per tablet 1-2 tablet, 1-2 tablet, Oral, Q4H PRN, Michael Boston, MD, 2 tablet at 06/23/15 2339 .  HYDROmorphone (DILAUDID) injection 0.5-2 mg, 0.5-2 mg, Intravenous, Q2H PRN, Michael Boston, MD, 2 mg at 06/24/15 0604 .  hyoscyamine (LEVBID) 0.375 MG 12 hr tablet 0.375 mg, 0.375 mg, Oral, BID, Michael Boston, MD, 0.375 mg at 06/23/15 2238 .  lactated ringers bolus 1,000 mL, 1,000 mL, Intravenous, Q8H PRN, Michael Boston, MD .  lactated ringers bolus 1,000 mL, 1,000 mL, Intravenous, Once, Michael Boston, MD .  lactated ringers infusion, , Intravenous, Continuous, Michael Boston, MD, Last Rate:  125 mL/hr at 06/24/15 0000 .  lip balm (CARMEX) ointment 1 application, 1 application, Topical, BID, Michael Boston, MD, 1 application at 09/98/33 2239 .  loperamide (IMODIUM) capsule 2 mg, 2 mg, Oral, BID, Michael Boston, MD, 2 mg at 06/23/15 2238 .  loperamide (IMODIUM) capsule 2-4 mg, 2-4 mg, Oral, Q8H PRN, Michael Boston, MD .  LORazepam (ATIVAN) injection 0.5-1 mg, 0.5-1 mg, Intravenous, Q8H PRN, Michael Boston, MD .  magic mouthwash, 15 mL, Oral, QID PRN, Michael Boston, MD .  meloxicam [facility]) tablet 15 mg, 15 mg, Oral, Daily, Michael Boston, MD, 15 mg at 06/23/15 2000 .  menthol-cetylpyridinium (CEPACOL) lozenge 3 mg, 1 lozenge, Oral, PRN, Michael Boston, MD .  methocarbamol (ROBAXIN) 1,000 mg in dextrose 5 % 50 mL IVPB, 1,000 mg, Intravenous, Q6H PRN, Michael Boston, MD .  methocarbamol (ROBAXIN) tablet 500-1,000 mg, 500-1,000 mg, Oral, QID PRN, Michael Boston, MD .  metoprolol (LOPRESSOR) injection 5 mg, 5 mg, Intravenous, Q6H PRN, Michael Boston, MD .  metroNIDAZOLE (FLAGYL) IVPB 500 mg, 500 mg, Intravenous, Q6H, Michael Boston, MD, 500 mg at 06/23/15 2339 .  nebivolol (BYSTOLIC) tablet 10 mg, 10 mg, Oral, QHS, Michael Boston, MD, 10 mg at 06/23/15 2238 .  ondansetron (ZOFRAN) injection 4 mg, 4 mg, Intravenous, Q6H PRN **OR** ondansetron (ZOFRAN) 8 mg in sodium chloride 0.9 % 50 mL IVPB, 8 mg, Intravenous, Q6H PRN, Michael Boston, MD .  ondansetron (ZOFRAN) tablet 4-8 mg, 4-8 mg, Oral, Q8H PRN, Michael Boston, MD .  ondansetron (ZOFRAN-ODT) disintegrating tablet 4-8 mg, 4-8 mg, Oral, Q6H PRN, Michael Boston, MD .  phenol (CHLORASEPTIC) mouth spray 2 spray, 2 spray, Mouth/Throat, PRN, Michael Boston, MD .  potassium chloride 10 mEq in 50 mL *CENTRAL LINE* IVPB, 10 mEq, Intravenous, Q1 Hr x 4, Michael Boston, MD .  promethazine (PHENERGAN) injection 6.25-12.5 mg, 6.25-12.5 mg, Intravenous, Q4H PRN, Michael Boston, MD .  promethazine (PHENERGAN) tablet 25 mg, 25 mg, Oral, Q6H PRN, Michael Boston, MD .  rosuvastatin (CRESTOR)  tablet 5 mg, 5 mg, Oral, QHS, Michael Boston, MD, 5 mg at 06/23/15 2238 .  saccharomyces boulardii (FLORASTOR) capsule 250 mg, 250 mg, Oral, BID, Michael Boston, MD, 250 mg at 06/23/15 2238 .  sodium chloride 0.9 % injection 10-40 mL, 10-40 mL, Intracatheter, Q12H, Michael Boston, MD, 10 mL at 06/23/15 2239 .  sodium chloride 0.9 % injection 10-40 mL, 10-40 mL, Intracatheter, PRN, Michael Boston, MD .  vitamin C (ASCORBIC ACID) tablet 500 mg, 500 mg, Oral, BID, Michael Boston, MD, 500 mg at 06/23/15 2238 .  [START ON 06/26/2015] Vitamin D (Ergocalciferol) (DRISDOL) capsule 50,000 Units, 50,000 Units, Oral, Once per day on Mon Thu, Michael Boston, MD .  witch hazel-glycerin (TUCKS) pad 1 application, 1 application, Topical, PRN, Michael Boston, MD .  zolpidem (AMBIEN) tablet 5 mg, 5 mg, Oral, QHS PRN, Michael Boston, MD  Family History  Problem Relation Age of Onset  . Coronary artery disease Mother 6  . Hypertension Mother   . Hyperlipidemia Mother   . Diabetes Mellitus I Mother   . Coronary artery disease Father   . Hyperlipidemia Father   . Hypertension Father   . Cancer Sister     skin - non melanoma  . Hyperlipidemia Brother   . Cancer Maternal Uncle 47    pancreatic with mets to colon and prostate  . Cirrhosis Maternal Uncle   . Hypertension Maternal Grandmother   . Diabetes Mellitus I Maternal Grandmother   . Hyperlipidemia Maternal Grandmother   . CVA Maternal Grandmother   . Hypertension Maternal Grandfather   . Coronary artery disease Maternal Grandfather 65  . Hyperlipidemia Maternal Grandfather   . Coronary artery disease Paternal Grandmother   . Hypertension Paternal Grandmother   . Hyperlipidemia Paternal Grandmother   . Diabetes Mellitus I Paternal Grandmother   . Hypertension Paternal Grandfather   . Hyperlipidemia Paternal Grandfather   . Coronary artery disease Paternal Grandfather     Social History   Social History  . Marital Status: Married    Spouse Name: N/A  .  Number of Children: N/A  . Years of Education: N/A   Social History Main Topics  . Smoking status: Former Smoker -- 0.50 packs/day for 4 years    Types: Cigarettes    Quit date: 07/15/2000  . Smokeless tobacco: Never Used     Comment: 1 pack/week intermittently x 7 years  . Alcohol Use: 0.6 oz/week    1 Shots of liquor per week     Comment: rarely   . Drug Use: No  . Sexual Activity: Not Asked   Other Topics Concern  . None   Social History Narrative   Tobacco Use: Cigarettes - Former Smoker   Alcohol: Yes, very rare, liquor.    No recreational drug use   Occupation: Head CMA @ [employer]   Marital Status: Married    Husband: Roselyn Reef Disabled   Children: 2 adopted kids [place]   Religion: [employer] [place]: A 12 point ROS discussed and pertinent positives are indicated in the HPI above.  All other systems are negative.  Review of Systems  Vital Signs: BP 149/63 mmHg  Pulse 84  Temp(Src) 98.7 F (37.1 C) (Oral)  Resp 20  SpO2 97%  Physical Exam  Constitutional: She is oriented to person, place, and time.  Morbidly obese female. Non-toxic but appears uncomfortable  HENT:  Head: Normocephalic.  Mouth/Throat: Oropharynx is clear and moist.  Neck: Normal range of motion. No tracheal deviation present.  Cardiovascular: Normal rate, regular rhythm and normal heart sounds.   Pulmonary/Chest: Effort normal and breath sounds normal. No respiratory distress.  Abdominal: Soft. She exhibits no distension. There is tenderness. There is no guarding.  Neurological: She is alert and oriented to person, place, and time.  Psychiatric: She has a normal mood and affect. Judgment normal.    Mallampati Score:  MD Evaluation Airway: WNL Heart: WNL Abdomen: WNL Chest/ Lungs: WNL ASA  Classification: 2 Mallampati/Airway Score: Two  Imaging: Ct Abdomen Pelvis W Contrast  06/23/2015  CLINICAL DATA:  Rectosigmoid  colon resection for rectal cancer on 05/25/2015. Rectal pain and bleeding for 2 weeks. Assess for possible abscess. EXAM: CT ABDOMEN AND PELVIS WITH CONTRAST TECHNIQUE: Multidetector CT  imaging of the abdomen and pelvis was performed using the standard protocol following bolus administration of intravenous contrast. CONTRAST:  166m ISOVUE-300 IOPAMIDOL (ISOVUE-300) INJECTION 61% COMPARISON:  MR abdomen 02/05/2015.  CT abdomen 01/20/2015 FINDINGS: Lung bases are clear. No pleural or pericardial fluid. Known hemangiomas are again demonstrated in the liver, unchanged from the previous studies. No evidence of hepatic metastatic disease. No calcified gallstones. The spleen is normal. The pancreas is normal. The adrenal glands are normal. The kidneys are normal. The aorta and IVC are normal. No retroperitoneal mass or lymphadenopathy in the region of the abdomen. No acute bowel pathology in the region of the abdomen. In the pelvis, the patient has had previous hysterectomy. There has been recent rectosigmoid surgery with loss of the fat plane between the rectosigmoid colon in the sacrum/coccyx. There is stool material within the rectum. It appears that there is a direct reanastomosis. There is an unusual air cavity in that region raising the possibility of dehiscence at the anastomosis with some contained air/gas. I cannot be completely sure that there is not an extraluminal abscess, and am suspicious of that. It may be necessary to perform a water soluble contrast study to completely understand this anatomy. IMPRESSION: Postoperative changes at the rectosigmoid region. Worrisome gas collection with unusual characteristics that suggests the presence of dehiscence of the anastomosis with a contained rupture. Possible abscess in that region, as it is difficult to define the exact margins of the lumen of the rectosigmoid. I am in the process of calling these results. Electronically Signed   By: MNelson ChimesM.D.   On:  06/23/2015 14:53   Dg Chest Port 1 View  05/25/2015  CLINICAL DATA:  Status post Port-A-Cath placement EXAM: PORTABLE CHEST 1 VIEW COMPARISON:  CT chest dated 02/07/2015 FINDINGS: Right chest port terminates at the cavoatrial junction. Low lung volumes. Bilateral upper lobe and left lower lobe opacities, possibly atelectasis. No pleural effusion or pneumothorax. The heart is top-normal in size for inspiration. IMPRESSION: Right chest port terminates at the cavoatrial junction. Electronically Signed   By: SJulian HyM.D.   On: 05/25/2015 15:34   Dg C-arm 1-60 Min-no Report  05/25/2015  CLINICAL DATA: surg C-ARM 1-60 MINUTES Fluoroscopy was utilized by the requesting physician.  No radiographic interpretation.    Labs:  CBC:  Recent Labs  05/26/15 0552 06/14/15 0829 06/23/15 2000 06/24/15 0630  WBC 11.5* 9.9 8.2 6.3  HGB 10.0* 8.3* 7.4* 6.5*  HCT 31.7* 26.6* 24.7* 21.5*  PLT 272 537* 421* 362    COAGS:  Recent Labs  06/24/15 0630  INR 1.22    BMP:  Recent Labs  03/08/15 1217  05/18/15 1450 05/26/15 0552 06/14/15 0829 06/23/15 2000 06/24/15 0630  NA 137  < > 140 137 141  --  132*  K 3.8  < > 4.0 4.3 4.3  --  3.2*  CL 102  --  107 106  --   --  99*  CO2 26  < > 27 27 27   --  25  GLUCOSE 101*  < > 144* 115* 99  --  94  BUN 11  < > 14 10 11.1  --  8  CALCIUM 9.0  < > 9.7 9.1 10.2  --  8.8*  CREATININE 0.80  < > 0.68 0.60 0.7 0.62 0.59  GFRNONAA >60  --  >60 >60  --  >60 >60  GFRAA >60  --  >60 >60  --  >60 >60  < > =  values in this interval not displayed.  LIVER FUNCTION TESTS:  Recent Labs  03/21/15 0929 03/27/15 0956 04/18/15 0900 06/14/15 0829  BILITOT 0.41 0.31 0.33 <0.30  AST 11 14 10 10   ALT 16 16 13 12   ALKPHOS 62 64 72 78  PROT 6.8 6.6 7.0 7.5  ALBUMIN 3.7 3.6 3.7 2.7*    TUMOR MARKERS:  Recent Labs  02/20/15 0949 04/18/15 0900  CEA <0.5 0.9    Assessment and Plan: S/p LAR about 1 month ago Pelvic abscess concerning for  anastomotic leak For BE this am Reviewed imaging, abscess amenable to perc drainage via transgluteal approach. Risks and Benefits discussed with the patient including bleeding, infection, damage to adjacent structures, bowel perforation/fistula connection, and sepsis. All of the patient's questions were answered, patient is agreeable to proceed. Consent signed and in chart.   Thank you for this interesting consult.  A copy of this report was sent to the requesting provider on this date.  SignedAscencion Dike 06/24/2015, 8:51 AM   I spent a total of 20 minutes in face to face in clinical consultation, greater than 50% of which was counseling/coordinating care for pelvic abscess drainage

## 2015-06-24 NOTE — Procedures (Signed)
Technically successful CT guided placed of a 10 Fr drainage catheter placement into the pelvis via R TG approach yielding 20 cc of feculent material.   All aspirated samples sent to the laboratory for analysis.   No immediate post procedural complications.   Ronny Bacon, MD Pager #: [phone]

## 2015-06-24 NOTE — Progress Notes (Signed)
[facility]  [address] [place], [place] [address] Phone: [phone] FAX: [phone]   MIKITA LESMEISTER [id number] [birth year]   Assessment  Pelvic abscess w probable contained leak  Problem List:   Principal Problem:   Pelvic abscess in female Active Problems:   Morbid obesity with BMI of 50.0-59.9, adult ([place])   Hypertension   Depression   Plan:  -Pelvic fluid collection of concern.  Discussed with interventional radiology.  Today, Dr. Pascal Lux feels confident of just placing a drain into the abscess cavity and reevaluate for leak at a later time.  As long as he feels that he can get into the cavity through the transgluteal approach (and he does), then proceed with perc drain first and avoid another study.  -CT guided drainage of collection  -IV ABx -control diarrhea -loop ileostomy if worsens -anemia s/p iron - follow.  Transfuse if worse -anxiolysis -VTE prophylaxis- SCDs, etc -mobilize as tolerated to help recovery  [name], M.D., F.A.C.S. Gastrointestinal and Minimally Invasive Surgery [facility] [address] [place], [place] [address] [phone] Main / Paging   06/24/2015  Subjective:  Still pretty sore.  Rather anemic.  Received IV iron infusion day before admission earlier this week.  Objective:  Vital signs:  Filed Vitals:   06/23/15 1654 06/23/15 2058 06/24/15 0134 06/24/15 0501  BP: 87/58 136/85 144/57 149/63  Pulse: 80 85 84 84  Temp: 99.6 F (37.6 C) 98.4 F (36.9 C) 98.3 F (36.8 C) 98.7 F (37.1 C)  TempSrc: Oral Axillary Oral Oral  Resp: _0 SpO2: 100% 99% 96% 97%       Intake/Output   Yesterday:  12/09 0701 - 12/10 0700 In: -  Out: 600 [Urine:600] This shift:     Bowel function:  Flatus: y  BM: y  Drain: n/a  Physical Exam:  General: Pt awake/alert/oriented x4 in moderate acute distress. Not toxic. Looks a  little sickly Eyes: PERRL, normal EOM. Sclera nonicteric Neuro: CN II-XII intact w/o focal sensory/motor deficits. Lymph: No head/neck/groin lymphadenopathy Psych: No delerium/psychosis/paranoia. Anxious. Rest less chronically shaking her legs. Consolable HENT: Normocephalic, Mucus membranes moist. No thrush Neck: Supple, No tracheal deviation Chest: No pain. Good respiratory excursion. CV: Pulses intact. Regular rhythm Abdomen: Soft, Nondistended. Morbidly obese. Incisions well-healed. No cellulitis or infection at extraction incision. Mild discomfort left suprapubic region. No incarcerated hernias. Normal external genitalia. No vaginal bleeding or discharge. No hernias. Rectal:. Perianal region sore. Perianal irritation but no major pruritus Ext: SCDs BLE. No significant edema. No cyanosis Skin: No petechiae / purpurea. No major sores Musculoskeletal: No severe joint pain. Good ROM major joints  Results:   Labs: Results for orders placed or performed during the hospital encounter of 06/23/15 (from the past 48 hour(s))  CBC     Status: Abnormal   Collection Time: 06/23/15  8:00 PM  Result Value Ref Range   WBC 8.2 4.0 - 10.5 K/uL   RBC 2.92 (L) 3.87 - 5.11 MIL/uL   Hemoglobin 7.4 (L) 12.0 - 15.0 g/dL   HCT 24.7 (L) 36.0 - 46.0 %   MCV 84.6 78.0 - 100.0 fL   MCH 25.3 (L) 26.0 - 34.0 pg   MCHC 30.0 30.0 - 36.0 g/dL   RDW 17.1 (H) 11.5 - 15.5 %   Platelets 421 (H) 150 - 400 K/uL  Creatinine, serum     Status: None   Collection Time: 06/23/15  8:00 PM  Result Value Ref Range   Creatinine, Ser 0.62 0.44 - 1.00 mg/dL   GFR calc non Af Amer >60 >60 mL/min   GFR calc Af Amer >60 >60 mL/min    Comment: (NOTE) The eGFR has been calculated using the CKD EPI equation. This calculation has not been validated in all clinical situations. eGFR's persistently <60 mL/min signify possible Chronic Kidney Disease.   CBC     Status: Abnormal   Collection Time: 06/24/15   6:30 AM  Result Value Ref Range   WBC 6.3 4.0 - 10.5 K/uL   RBC 2.53 (L) 3.87 - 5.11 MIL/uL   Hemoglobin 6.5 (LL) 12.0 - 15.0 g/dL    Comment: REPEATED TO VERIFY CRITICAL RESULT CALLED TO, READ BACK BY AND VERIFIED WITH: GRAVES,R/5W _0  ON 06/24/15 BY KARCZEWSKI,S.    HCT 21.5 (L) 36.0 - 46.0 %   MCV 85.0 78.0 - 100.0 fL   MCH 25.7 (L) 26.0 - 34.0 pg   MCHC 30.2 30.0 - 36.0 g/dL   RDW 17.2 (H) 11.5 - 15.5 %   Platelets 362 150 - 400 K/uL  Protime-INR     Status: Abnormal   Collection Time: 06/24/15  6:30 AM  Result Value Ref Range   Prothrombin Time 15.6 (H) 11.6 - 15.2 seconds   INR 1.22 0.00 - 1.49    Imaging / Studies: Ct Abdomen Pelvis W Contrast  06/23/2015  CLINICAL DATA:  Rectosigmoid colon resection for rectal cancer on 05/25/2015. Rectal pain and bleeding for 2 weeks. Assess for possible abscess. EXAM: CT ABDOMEN AND PELVIS WITH CONTRAST TECHNIQUE: Multidetector CT imaging of the abdomen and pelvis was performed using the standard protocol following bolus administration of intravenous contrast. CONTRAST:  147m ISOVUE-300 IOPAMIDOL (ISOVUE-300) INJECTION 61% COMPARISON:  MR abdomen 02/05/2015.  CT abdomen 01/20/2015 FINDINGS: Lung bases are clear. No pleural or pericardial fluid. Known hemangiomas are again demonstrated in the liver, unchanged from the previous studies. No evidence of hepatic metastatic disease. No calcified gallstones. The spleen is normal. The pancreas is normal. The adrenal glands are normal. The kidneys are normal. The aorta and IVC are normal. No retroperitoneal mass or lymphadenopathy in the region of the abdomen. No acute bowel pathology in the region of the abdomen. In the pelvis, the patient has had previous hysterectomy. There has been recent rectosigmoid surgery with loss of the fat plane between the rectosigmoid colon in the sacrum/coccyx. There is stool material within the rectum. It appears that there is a direct reanastomosis. There is an unusual air  cavity in that region raising the possibility of dehiscence at the anastomosis with some contained air/gas. I cannot be completely sure that there is not an extraluminal abscess, and am suspicious of that. It may be necessary to perform a water soluble contrast study to completely understand this anatomy. IMPRESSION: Postoperative changes at the rectosigmoid region. Worrisome gas collection with unusual characteristics that suggests the presence of dehiscence of the anastomosis with a contained rupture. Possible abscess in that region, as it is difficult to define the exact margins of the lumen of the rectosigmoid. I am in the process of calling these results. Electronically Signed   By: MNelson ChimesM.D.   On: 06/23/2015 14:53    Medications / Allergies: per chart  Antibiotics: Anti-infectives    Start     Dose/Rate Route Frequency Ordered Stop   06/23/15 1800  ciprofloxacin (CIPRO) IVPB 400 mg     400 mg 200 mL/hr over 60 Minutes  Intravenous Every 12 hours 06/23/15 1647     06/23/15 1800  metroNIDAZOLE (FLAGYL) IVPB 500 mg     500 mg 100 mL/hr over 60 Minutes Intravenous Every 6 hours 06/23/15 1647          POST-OPERATIVE DIAGNOSIS:   Proximal Rectal Cancer  SURGERY: 05/25/2015  XI ROBOTIC ASSISTED RECTOSIGMOID LOWER ANTERIOR RESECTION Laparoscopic and robotic lysis of adhesions X 60 MINUTES RIGID PROCTOSCOPY RIGHT SALPINGO-OOPHORECTOMY INSERTION PORT-A-CATH in R IJ  SURGEON:   Surgeon(s): Michael Boston, MD Leighton Ruff, MD  OR FINDINGS:   Patient had a bulky tumor in proximal and mid rectum. Most of it focused on the right anterolateral circumference. Still came down to around 9 cm from the anal verge.  I enlarged right ovary was some firm and mainly cystic areas. At least 6 cm in largest dimension. Left side looked atrophic and normal. Right ovary removed.  PowerPort is a ClearVUE 8 [place]. Reference #[id number] Lot number [id number] It is in the right internal  jugular vein. The tip sits at the right atrial/superior vena caval junction  No obvious metastatic disease on visceral parietal peritoneum or liver.  The anastomosis rests 5-6 cm from the anal verge by rigid proctoscopy  Diagnosis 1. Colon, segmental resection for tumor, recto-sigmoid INVASIVE COLONIC ADENOCARCINOMA (0.2 CM IN GREATEST DIMENSION RESIDUAL TUMOR) THE TUMOR INVADE SUBMUCOSA (0.3 CM IN DEPTH, PT1) MARGINS OF RESECTION ARE NEGATIVE FOR TUMOR TWELVE BENIGN LYMPH NODES (0/12) 2. Colon, resection margin (donut), proximal anastomotic ring BENIGN COLONIC TISSUE, NEGATIVE FOR MALIGANCY 3. Colon, resection margin (donut), distal anastomotic ring BENIGN COLONIC TISSUE, NEGATIVE FOR MALIGNANCY 4. Ovary, right BENIGN SEROUS CYSTADENOMA Microscopic Comment 1. COLON AND RECTUM (INCLUDING TRANS-ANAL RESECTION): Specimen: Colon-rectum Procedure: Segmental resection Tumor site: Rectum anterior wall mid to distal third of rectum Specimen integrity: intact Macroscopic intactness of mesorectum: Complete: _ Macroscopic tumor perforation: Negative Invasive tumor: Maximum size: Histologic type(s): G2 Histologic grade and differentiation: G1: well differentiated/low grade G2: moderately differentiated/low grade G3: poorly differentiated/high grade G4: undifferentiated/high grade Type of polyp in which invasive carcinoma arose: _ 1 of 3 Duplicate copy FINAL for CAROLYNA, YERIAN N [phone]) Microscopic Comment(continued) Microscopic extension of invasive tumor:Submucosa Lymph-Vascular invasion: Not identified Peri-neural invasion: negative Tumor deposit(s) (discontinuous extramural extension): Negative Resection margins: Proximal margin: Negative Distal margin: Negative Circumferential (radial) (posterior ascending, posterior descending; lateral and posterior mid-rectum; and entire lower 1/3 rectum):Negatvie Mesenteric margin (sigmoid and transverse): Negative Distance  closest margin (if all above margins negative): 2.5 cm Treatment effect (neo-adjuvant therapy): Present Additional polyp(s): Negative Non-neoplastic findings: Unremarkable Lymph nodes: number examined 12; number positive: 0 Pathologic Staging: T1, N0, M_ Ancillary studies: Preserved expression of the makor and minor MMR proteins MLH1, MSH2, MSH6 and PMS2. MLH1: Casimer Lanius MD Pathologist, Electronic Signature (Case signed 05/31/2015) Specimen Aerica Rincon and Clinical Information Specimen(s) Obtained: 1. Colon, segmental resection for tumor, recto-sigmoid 2. Colon, resection margin (donut), proximal anastomotic ring 3. Colon, resection margin (donut), distal anastomotic ring 4. Ovary, right Specimen Clinical Information 1. proximal rectal cancer [rd] Shanyce Daris 1. Specimen: Rectosigmoid, to include at least portion of distal 1/3 of rectum. Specimen integrity: Intact Specimen length: 38 cm. 11 cm from sigmoid rectal junction to distal margin, and 4 cm from peritoneal reflection to distal margin. Mesorectal intactness: Complete Tumor location: Anterior wall, middle third to distal third of rectum Tumor size: There is a 4 cm in length and 2.2 cm in diameter area of healing ulceration with underlying indurated tan white dense tissue up to 1.1 cm  thick. A discrete mass is not identified. Percent of bowel circumference involved: 40% Tumor distance to margins: Proximal: 28 cm Distal: 6 cm Radial (posterior ascending, posterior descending; lateral and posterior mid-rectum; and entire lower 1/3 rectum): The area of dense indurated tissue is 2.5 cm from the perirectal soft tissue margin. 2 of 3 Duplicate copy FINAL for EVANGELA, HEFFLER [phone]) Suprina [place](continued) Macroscopic extent of tumor invasion: No definite mass to evaluate invasion Total presumed lymph nodes: Found are seventeen possible lymph nodes ranging from 0.4 to 1.3 cm Extramural satellite tumor nodules: None Mucosal polyp(s):  None Additional findings: None Block summary: A = proximal margin B = distal margin C-F = sections of ulceration and underlying indurated tissue G-J = ulceration and underlying indurated tissue, with nearest serosa/peritoneal reflection. K = perirectal soft tissue margin nearest indurated tissue L = four possible nodes, whole M = four possible nodes, whole N = four possible nodes, whole O = four possible node, whole P = one node bisected Total = sixteen blocks. 2. Received in formalin is a 2 cm in diameter and 1 cm thick ring of tan to hyperemic smooth mucosa with underlying tissue with embedded suture material. Representative sections are submitted in one block. 3. Received in formalin is a 1.4 cm in diameter and up to 1 cm thick ring of tan to hyperemic smooth mucosa and underlying tissue with embedded metallic staples. Representative sections submitted in one block. 4. Received in formalin is a 5.1 x 4.4 x 3.4 cm ovary, clinically right, which has a tan pink to dark red exterior with scattered adhesions. There are also portions of attached tissue consistent with fimbria, however definite tube is not identified. On sectioning, there are several serous and mucoid filled cysts which are up to 4 cm in greatest dimension, and have smooth inner surfaces with no areas of excrescences. Tissue adjacent to the largest cyst is focally tan white, firm and dense. Representative sections are submitted in five blocks. (SSW:kh 05-26-15) Report signed out from the following location(s) Technical component and interpretation was performed at [facility], [place], [place] [address]. CLIA #: [id number], 3 of 3  Note: Portions of this report may have been transcribed using voice recognition software. Every effort was made to ensure accuracy; however, inadvertent computerized transcription errors may be present.   Any transcriptional errors that result from this process are  unintentional.     [name], M.D., F.A.C.S. Gastrointestinal and Minimally Invasive Surgery [facility] [address], [place], [place] [address] [phone] Main / Paging   06/24/2015  CARE TEAM:  PCP: Vidal Schwalbe, MD  Outpatient Care Team: Patient Care Team: [name], MD as PCP - General (Family Medicine) Michael Boston, MD as Consulting Physician (General Surgery) Truitt Merle, MD as Consulting Physician (Medical Oncology) Kyung Rudd, MD as Consulting Physician (Radiation Oncology) Teena Irani, MD as Consulting Physician (Gastroenterology)  Inpatient Treatment Team: Treatment Team: Attending Provider: Michael Boston, MD; Technician: Leda Quail, NT; Registered Nurse: Josepha Pigg, RN

## 2015-06-24 NOTE — Progress Notes (Signed)
Dr. Marcello Moores responded to page from night shift RN to receive critical HGB of 6.5 called to unit from lab this am during shift change. No new orders received. Dr. Marcello Moores reported that Dr. Johney Maine was aware of value. Pt asymptomatic and recently transported to radiology for ABD 2 view.

## 2015-06-25 DIAGNOSIS — E440 Moderate protein-calorie malnutrition: Secondary | ICD-10-CM | POA: Diagnosis present

## 2015-06-25 MED ORDER — HYDROCODONE-ACETAMINOPHEN 10-325 MG PO TABS
1.0000 | ORAL_TABLET | Freq: Every day | ORAL | Status: DC
  Administered 2015-06-25 – 2015-06-26 (×2): 1 via ORAL
  Filled 2015-06-25 (×2): qty 1

## 2015-06-25 MED ORDER — LOPERAMIDE HCL 2 MG PO CAPS
4.0000 mg | ORAL_CAPSULE | Freq: Two times a day (BID) | ORAL | Status: DC
  Administered 2015-06-25 – 2015-06-26 (×4): 4 mg via ORAL
  Filled 2015-06-25 (×5): qty 2

## 2015-06-25 MED ORDER — FERROUS SULFATE 325 (65 FE) MG PO TABS
325.0000 mg | ORAL_TABLET | Freq: Three times a day (TID) | ORAL | Status: DC
Start: 2015-06-25 — End: 2015-06-28
  Administered 2015-06-25 – 2015-06-27 (×8): 325 mg via ORAL
  Filled 2015-06-25 (×12): qty 1

## 2015-06-25 MED ORDER — ENSURE ENLIVE PO LIQD
237.0000 mL | Freq: Two times a day (BID) | ORAL | Status: DC
  Administered 2015-06-25 – 2015-06-26 (×2): 237 mL via ORAL

## 2015-06-25 MED ORDER — HYOSCYAMINE SULFATE ER 0.375 MG PO TB12
0.3750 mg | ORAL_TABLET | Freq: Two times a day (BID) | ORAL | Status: DC | PRN
  Filled 2015-06-25: qty 1

## 2015-06-25 NOTE — Progress Notes (Signed)
[facility]  [address] [place], [place] [address] Phone: [phone] FAX: [phone]   MEKAILA TARNOW [id number] [birth year]   Assessment  Pelvic abscess w probable contained leak  Problem List:   Principal Problem:   Pelvic abscess in female Active Problems:   Morbid obesity with BMI of 50.0-59.9, adult ([place])   Hypertension   Depression   Plan:  -Pelvic fluid collection drained.  Teach care -IV ABx - f/u Cx -retry PO w supp shakes.  Check nutrition labs -control diarrhea.  Inc imodium & add PO iron -loop ileostomy if worsens -anemia s/p iron - follow.  Transfuse if worse -anxiolysis -VTE prophylaxis- SCDs, etc -mobilize as tolerated to help recovery  [name], M.D., F.A.C.S. Gastrointestinal and Minimally Invasive Surgery [facility] [address], [place], [place] [address] [phone] Main / Paging   06/25/2015  Subjective:  Still pretty sore right inner pelvis. Husband at bedside. Rather anemic.  Received IV iron infusion day before admission earlier this week.  Objective:  Vital signs:  Filed Vitals:   06/24/15 1736 06/24/15 2150 06/25/15 0141 06/25/15 0621  BP: 128/63 140/70 128/62 128/58  Pulse: 83 82 71 74  Temp: 98.8 F (37.1 C) 98.9 F (37.2 C) 97.8 F (36.6 C) 98.9 F (37.2 C)  TempSrc: Oral Oral Oral Oral  Resp: 18 18 18 18   Height:      Weight:      SpO2: 97% 93% 100% 100%    Last BM Date: 06/24/15  Intake/Output   Yesterday:  12/10 0701 - 12/11 0700 In: 5800 [I.V.:4500; IV Piggyback:1300] Out: 601 [Urine:600; Stool:1] This shift:     Bowel function:  Flatus: y  BM: y  Drain: n/a  Physical Exam:  General: Pt awake/alert/oriented x4 in moderate acute distress. PALE. Not toxic. Tried Eyes: PERRL, normal EOM. Sclera nonicteric Neuro: CN II-XII intact w/o focal sensory/motor deficits. Lymph: No head/neck/groin  lymphadenopathy Psych: No delerium/psychosis/paranoia. Anxious. Rest less chronically shaking her legs. Consolable HENT: Normocephalic, Mucus membranes moist. No thrush Neck: Supple, No tracheal deviation Chest: No pain. Good respiratory excursion. CV: Pulses intact. Regular rhythm Abdomen: Soft, Nondistended. Morbidly obese. Incisions well-healed. No cellulitis or infection at extraction incision. Mild discomfort left suprapubic region. No incarcerated hernias. Normal external genitalia. No vaginal bleeding or discharge. No hernias. Rectal:. Perianal region sore. Perianal skin clean. Ext: SCDs BLE. No significant edema. No cyanosis Skin: No petechiae / purpurea. No major sores Musculoskeletal: No severe joint pain. Good ROM major joints  Results:   Labs: Results for orders placed or performed during the hospital encounter of 06/23/15 (from the past 48 hour(s))  CBC     Status: Abnormal   Collection Time: 06/23/15  8:00 PM  Result Value Ref Range   WBC 8.2 4.0 - 10.5 K/uL   RBC 2.92 (L) 3.87 - 5.11 MIL/uL   Hemoglobin 7.4 (L) 12.0 - 15.0 g/dL   HCT 24.7 (L) 36.0 - 46.0 %   MCV 84.6 78.0 - 100.0 fL   MCH 25.3 (L) 26.0 - 34.0 pg   MCHC 30.0 30.0 - 36.0 g/dL   RDW 17.1 (H) 11.5 - 15.5 %   Platelets 421 (H) 150 - 400 K/uL  Creatinine, serum     Status: None   Collection Time: 06/23/15  8:00 PM  Result Value Ref Range   Creatinine, Ser 0.62 0.44 - 1.00 mg/dL   GFR calc non Af Amer >60 >60 mL/min  GFR calc Af Amer >60 >60 mL/min    Comment: (NOTE) The eGFR has been calculated using the CKD EPI equation. This calculation has not been validated in all clinical situations. eGFR's persistently <60 mL/min signify possible Chronic Kidney Disease.   Basic metabolic panel     Status: Abnormal   Collection Time: 06/24/15  6:30 AM  Result Value Ref Range   Sodium 132 (L) 135 - 145 mmol/L   Potassium 3.2 (L) 3.5 - 5.1 mmol/L   Chloride 99 (L) 101 - 111 mmol/L   CO2  25 22 - 32 mmol/L   Glucose, Bld 94 65 - 99 mg/dL   BUN 8 6 - 20 mg/dL   Creatinine, Ser 0.59 0.44 - 1.00 mg/dL   Calcium 8.8 (L) 8.9 - 10.3 mg/dL   GFR calc non Af Amer >60 >60 mL/min   GFR calc Af Amer >60 >60 mL/min    Comment: (NOTE) The eGFR has been calculated using the CKD EPI equation. This calculation has not been validated in all clinical situations. eGFR's persistently <60 mL/min signify possible Chronic Kidney Disease.    Anion gap 8 5 - 15  CBC     Status: Abnormal   Collection Time: 06/24/15  6:30 AM  Result Value Ref Range   WBC 6.3 4.0 - 10.5 K/uL   RBC 2.53 (L) 3.87 - 5.11 MIL/uL   Hemoglobin 6.5 (LL) 12.0 - 15.0 g/dL    Comment: REPEATED TO VERIFY CRITICAL RESULT CALLED TO, READ BACK BY AND VERIFIED WITH: GRAVES,R/5W @0656  ON 06/24/15 BY KARCZEWSKI,S.    HCT 21.5 (L) 36.0 - 46.0 %   MCV 85.0 78.0 - 100.0 fL   MCH 25.7 (L) 26.0 - 34.0 pg   MCHC 30.2 30.0 - 36.0 g/dL   RDW 17.2 (H) 11.5 - 15.5 %   Platelets 362 150 - 400 K/uL  Protime-INR     Status: Abnormal   Collection Time: 06/24/15  6:30 AM  Result Value Ref Range   Prothrombin Time 15.6 (H) 11.6 - 15.2 seconds   INR 1.22 0.00 - 1.49  Magnesium     Status: None   Collection Time: 06/24/15  6:46 AM  Result Value Ref Range   Magnesium 1.8 1.7 - 2.4 mg/dL    Imaging / Studies: Dg Pelvis 1-2 Views  06/24/2015  CLINICAL DATA:  Pain EXAM: PELVIS - 1-2 VIEW COMPARISON:  None. FINDINGS: There is no evidence of pelvic fracture or dislocation. Joint spaces appear intact. Visualized bowel gas pattern is normal. There are phleboliths in the pelvis. IMPRESSION: No bony abnormality.  Visualized bowel gas pattern unremarkable. Electronically Signed   By: Lowella Grip III M.D.   On: 06/24/2015 09:42   Ct Abdomen Pelvis W Contrast  06/23/2015  CLINICAL DATA:  Rectosigmoid colon resection for rectal cancer on 05/25/2015. Rectal pain and bleeding for 2 weeks. Assess for possible abscess. EXAM: CT ABDOMEN AND  PELVIS WITH CONTRAST TECHNIQUE: Multidetector CT imaging of the abdomen and pelvis was performed using the standard protocol following bolus administration of intravenous contrast. CONTRAST:  179m ISOVUE-300 IOPAMIDOL (ISOVUE-300) INJECTION 61% COMPARISON:  MR abdomen 02/05/2015.  CT abdomen 01/20/2015 FINDINGS: Lung bases are clear. No pleural or pericardial fluid. Known hemangiomas are again demonstrated in the liver, unchanged from the previous studies. No evidence of hepatic metastatic disease. No calcified gallstones. The spleen is normal. The pancreas is normal. The adrenal glands are normal. The kidneys are normal. The aorta and IVC are normal. No retroperitoneal mass or  lymphadenopathy in the region of the abdomen. No acute bowel pathology in the region of the abdomen. In the pelvis, the patient has had previous hysterectomy. There has been recent rectosigmoid surgery with loss of the fat plane between the rectosigmoid colon in the sacrum/coccyx. There is stool material within the rectum. It appears that there is a direct reanastomosis. There is an unusual air cavity in that region raising the possibility of dehiscence at the anastomosis with some contained air/gas. I cannot be completely sure that there is not an extraluminal abscess, and am suspicious of that. It may be necessary to perform a water soluble contrast study to completely understand this anatomy. IMPRESSION: Postoperative changes at the rectosigmoid region. Worrisome gas collection with unusual characteristics that suggests the presence of dehiscence of the anastomosis with a contained rupture. Possible abscess in that region, as it is difficult to define the exact margins of the lumen of the rectosigmoid. I am in the process of calling these results. Electronically Signed   By: Nelson Chimes M.D.   On: 06/23/2015 14:53   Dg Abd Portable 2v  06/24/2015  CLINICAL DATA:  Abdominal pain and diarrhea.  Colon can't EXAM: PORTABLE ABDOMEN - 2  VIEW COMPARISON:  CT 06/23/2015 FINDINGS: No dilated loops of large or small bowel. There is gas and stool throughout the colon and rectosigmoid colon. Surgical clips noted in the lower pelvis. No intraperitoneal free air. IMPRESSION: No evidence of bowel obstruction or intraperitoneal free air. Electronically Signed   By: Suzy Bouchard M.D.   On: 06/24/2015 09:20   Ct Image Guide Drain Transvag Transrect Peritoneal Retroper  06/24/2015  INDICATION: History of colon cancer, post robotic assisted low anterior resection, now with pelvic pain and fever with CT findings worrisome for perianastomotic leak. Please perform CT-guided percutaneous drainage catheter placement. EXAM: CT IMAGE GUIDE DRAIN TRANSVAG TRANSRECT PERITONEAL RETROPER COMPARISON:  CT abdomen pelvis - 06/23/2015; 01/20/2015 MEDICATIONS: The patient is currently admitted to the hospital and receiving intravenous antibiotics. The antibiotics were administered within an appropriate time frame prior to the initiation of the procedure. ANESTHESIA/SEDATION: Fentanyl 100 mcg IV; Versed 6 mg IV Total Moderate Sedation time 19 minutes CONTRAST:  None COMPLICATIONS: None immediate PROCEDURE: Informed written consent was obtained from the patient after a discussion of the risks, benefits and alternatives to treatment. The patient was placed prone on the CT gantry and a pre procedural CT was performed re-demonstrating the known abscess/fluid collection within the presacral space with dominant component measuring approximately 7.2 x 4.5 cm (image 19, series 3). The procedure was planned. A timeout was performed prior to the initiation of the procedure. The skin overlying the medial aspect the right buttocks was prepped and draped in the usual sterile fashion. The overlying soft tissues were anesthetized with 1% lidocaine with epinephrine. Appropriate trajectory was planned with the use of a 22 gauge spinal needle. An 18 gauge trocar needle was advanced into  the abscess/fluid collection and a short Amplatz super stiff wire was coiled within the collection. Appropriate positioning was confirmed with a limited CT scan. The tract was serially dilated allowing placement of a 10 [place] all-purpose drainage catheter. Appropriate positioning was confirmed with a limited postprocedural CT scan. Approximately 20 cc of feculent material was aspirated. Given the feculent appearance of the aspirated fluid, the drainage catheter was connected to a JP bulb. The drainage catheter was sutured in place. A dressing was placed. The patient tolerated the procedure well without immediate post procedural complication. IMPRESSION: Successful  CT guided placement of a 10 [place] all purpose drain catheter into the pelvis via right trans gluteal approach with aspiration of 20 mL of purulent fluid. Samples were sent to the laboratory as requested by the ordering clinical team. Electronically Signed   By: Sandi Mariscal M.D.   On: 06/24/2015 13:06    Medications / Allergies: per chart  Antibiotics: Anti-infectives    Start     Dose/Rate Route Frequency Ordered Stop   06/23/15 1800  ciprofloxacin (CIPRO) IVPB 400 mg     400 mg 200 mL/hr over 60 Minutes Intravenous Every 12 hours 06/23/15 1647     06/23/15 1800  metroNIDAZOLE (FLAGYL) IVPB 500 mg     500 mg 100 mL/hr over 60 Minutes Intravenous Every 6 hours 06/23/15 1647          POST-OPERATIVE DIAGNOSIS:   Proximal Rectal Cancer  SURGERY: 05/25/2015  XI ROBOTIC ASSISTED RECTOSIGMOID LOWER ANTERIOR RESECTION Laparoscopic and robotic lysis of adhesions X 60 MINUTES RIGID PROCTOSCOPY RIGHT SALPINGO-OOPHORECTOMY INSERTION PORT-A-CATH in R IJ  SURGEON:   Surgeon(s): Michael Boston, MD Leighton Ruff, MD  OR FINDINGS:   Patient had a bulky tumor in proximal and mid rectum. Most of it focused on the right anterolateral circumference. Still came down to around 9 cm from the anal verge.  I enlarged right ovary was  some firm and mainly cystic areas. At least 6 cm in largest dimension. Left side looked atrophic and normal. Right ovary removed.  PowerPort is a ClearVUE 8 [place]. Reference #[id number] Lot number [id number] It is in the right internal jugular vein. The tip sits at the right atrial/superior vena caval junction  No obvious metastatic disease on visceral parietal peritoneum or liver.  The anastomosis rests 5-6 cm from the anal verge by rigid proctoscopy  Diagnosis 1. Colon, segmental resection for tumor, recto-sigmoid INVASIVE COLONIC ADENOCARCINOMA (0.2 CM IN GREATEST DIMENSION RESIDUAL TUMOR) THE TUMOR INVADE SUBMUCOSA (0.3 CM IN DEPTH, PT1) MARGINS OF RESECTION ARE NEGATIVE FOR TUMOR TWELVE BENIGN LYMPH NODES (0/12) 2. Colon, resection margin (donut), proximal anastomotic ring BENIGN COLONIC TISSUE, NEGATIVE FOR MALIGANCY 3. Colon, resection margin (donut), distal anastomotic ring BENIGN COLONIC TISSUE, NEGATIVE FOR MALIGNANCY 4. Ovary, right BENIGN SEROUS CYSTADENOMA Microscopic Comment 1. COLON AND RECTUM (INCLUDING TRANS-ANAL RESECTION): Specimen: Colon-rectum Procedure: Segmental resection Tumor site: Rectum anterior wall mid to distal third of rectum Specimen integrity: intact Macroscopic intactness of mesorectum: Complete: _ Macroscopic tumor perforation: Negative Invasive tumor: Maximum size: Histologic type(s): G2 Histologic grade and differentiation: G1: well differentiated/low grade G2: moderately differentiated/low grade G3: poorly differentiated/high grade G4: undifferentiated/high grade Type of polyp in which invasive carcinoma arose: _ 1 of 3 Duplicate copy FINAL for CLAUDE, SWENDSEN N [phone]) Microscopic Comment(continued) Microscopic extension of invasive tumor:Submucosa Lymph-Vascular invasion: Not identified Peri-neural invasion: negative Tumor deposit(s) (discontinuous extramural extension): Negative Resection margins: Proximal margin:  Negative Distal margin: Negative Circumferential (radial) (posterior ascending, posterior descending; lateral and posterior mid-rectum; and entire lower 1/3 rectum):Negatvie Mesenteric margin (sigmoid and transverse): Negative Distance closest margin (if all above margins negative): 2.5 cm Treatment effect (neo-adjuvant therapy): Present Additional polyp(s): Negative Non-neoplastic findings: Unremarkable Lymph nodes: number examined 12; number positive: 0 Pathologic Staging: T1, N0, M_ Ancillary studies: Preserved expression of the makor and minor MMR proteins MLH1, MSH2, MSH6 and PMS2. MLH1: Casimer Lanius MD Pathologist, Electronic Signature (Case signed 05/31/2015) Specimen Avarey Yaeger and Clinical Information Specimen(s) Obtained: 1. Colon, segmental resection for tumor, recto-sigmoid 2. Colon, resection margin (donut), proximal anastomotic ring 3. Colon,  resection margin (donut), distal anastomotic ring 4. Ovary, right Specimen Clinical Information 1. proximal rectal cancer [rd] Rital Cavey 1. Specimen: Rectosigmoid, to include at least portion of distal 1/3 of rectum. Specimen integrity: Intact Specimen length: 38 cm. 11 cm from sigmoid rectal junction to distal margin, and 4 cm from peritoneal reflection to distal margin. Mesorectal intactness: Complete Tumor location: Anterior wall, middle third to distal third of rectum Tumor size: There is a 4 cm in length and 2.2 cm in diameter area of healing ulceration with underlying indurated tan white dense tissue up to 1.1 cm thick. A discrete mass is not identified. Percent of bowel circumference involved: 40% Tumor distance to margins: Proximal: 28 cm Distal: 6 cm Radial (posterior ascending, posterior descending; lateral and posterior mid-rectum; and entire lower 1/3 rectum): The area of dense indurated tissue is 2.5 cm from the perirectal soft tissue margin. 2 of 3 Duplicate copy FINAL for LEIDI, ASTLE  [phone]) [place](continued) Macroscopic extent of tumor invasion: No definite mass to evaluate invasion Total presumed lymph nodes: Found are seventeen possible lymph nodes ranging from 0.4 to 1.3 cm Extramural satellite tumor nodules: None Mucosal polyp(s): None Additional findings: None Block summary: A = proximal margin B = distal margin C-F = sections of ulceration and underlying indurated tissue G-J = ulceration and underlying indurated tissue, with nearest serosa/peritoneal reflection. K = perirectal soft tissue margin nearest indurated tissue L = four possible nodes, whole M = four possible nodes, whole N = four possible nodes, whole O = four possible node, whole P = one node bisected Total = sixteen blocks. 2. Received in formalin is a 2 cm in diameter and 1 cm thick ring of tan to hyperemic smooth mucosa with underlying tissue with embedded suture material. Representative sections are submitted in one block. 3. Received in formalin is a 1.4 cm in diameter and up to 1 cm thick ring of tan to hyperemic smooth mucosa and underlying tissue with embedded metallic staples. Representative sections submitted in one block. 4. Received in formalin is a 5.1 x 4.4 x 3.4 cm ovary, clinically right, which has a tan pink to dark red exterior with scattered adhesions. There are also portions of attached tissue consistent with fimbria, however definite tube is not identified. On sectioning, there are several serous and mucoid filled cysts which are up to 4 cm in greatest dimension, and have smooth inner surfaces with no areas of excrescences. Tissue adjacent to the largest cyst is focally tan white, firm and dense. Representative sections are submitted in five blocks. (SSW:kh 05-26-15) Report signed out from the following location(s) Technical component and interpretation was performed at [facility], [address], [place] [address]. CLIA #: [id number], 3 of  3  Note: Portions of this report may have been transcribed using voice recognition software. Every effort was made to ensure accuracy; however, inadvertent computerized transcription errors may be present.   Any transcriptional errors that result from this process are unintentional.     [name], M.D., F.A.C.S. Gastrointestinal and Minimally Invasive Surgery [facility] [address], [place], [place] [address] [phone] Main / Paging   06/25/2015  CARE TEAM:  PCP: Vidal Schwalbe, MD  Outpatient Care Team: Patient Care Team: [name], MD as PCP - General (Family Medicine) Michael Boston, MD as Consulting Physician (General Surgery) Truitt Merle, MD as Consulting Physician (Medical Oncology) Kyung Rudd, MD as Consulting Physician (Radiation Oncology) Teena Irani, MD as Consulting Physician (  Gastroenterology)  Inpatient Treatment Team: Treatment Team: Attending Provider: Michael Boston, MD; Technician: Leda Quail, NT; Respiratory Therapist: Delaney Meigs, RRT

## 2015-06-25 NOTE — Progress Notes (Signed)
Drain care instructions started earlier today with pt and husband. Pt has had bulb drain before and understands how to empty and record results. Information and reord sheet given to pt. Husband demonstrated understanding of how to flush drain as ordered with 5 ml NS with good aseptic technique.

## 2015-06-25 NOTE — Discharge Instructions (Signed)
DRAIN CARE:   You have a closed bulb drain to help you heal.    A bulb drain is a small, plastic reservoir which creates a gentle suction. It is used to remove excess fluid from a surgical wound. The color and amount of fluid will vary. Immediately after surgery, the fluid is bright red. It may gradually change to a yellow color. When the amount decreases to about 1 or 2 tablespoons (15 to 30 cc) per 24 hours, your caregiver will usually remove it.  JP Care  The Jackson-Pratt drainage system has flexible tubing attached to a soft, plastic bulb with a stopper. The drainage end of the tubing, which is flat and white, goes into your body through a small opening near your incision (surgical cut). A stitch holds the drainage end in place. The rest of the tube is outside your body, attached to the bulb. When the bulb is compressed with the stopper in place, it creates a vacuum. This causes a constant gentle suction, which helps draw out fluid that collects under your incision. The bulb should be compressed at all times, except when you are emptying the drainage.  How long you will have your Jackson-Pratt depends on your surgery and the amount of fluid is draining. This is different for everyone. The Jackson-Pratt is usually removed when the drainage is 30 mL or less over 24 hours. To keep track of how much drainage youre having, you will record the amount in a drainage log. Its important to bring the log with you to your follow-up appointments.  Caring for Your Jackson-Pratt at Home In order to care for your Jackson-Pratt at home, you or your caregiver will do the following:  Empty the drain once a day and record the color and amount of drainage  Care for the area where the tubing enters your skin by washing with soap and water.  Milk the tubing to help move clots into the bulb.  Do this before you empty and measure your drainage. Look in the mirror at the tubing. This will help you see where your  hands need to be. Pinch the tubing close to where it goes into your skin between your thumb and forefinger. With the thumb and forefinger of your other hand, pinch the tubing right below your other fingers. Keep your fingers pinched and slide them down the tubing, pushing any clots down toward the bulb. You may want to use alcohol swabs to help you slide your fingers down the tubing. Repeat steps 3 and 4 as necessary to push clots from the tubing into the bulb. If you are not able to move a clot into the bulb, call your doctors office. The fluid may leak around the insertion site if a clot is blocking the drainage flow. If there is fluid in the bulb and no leakage at the insertion site, the drain is working.  How to Empty Your Jackson-Pratt and Record the Drainage You will need to empty your Jackson-Pratt every day  Gather the following supplies:  Measuring container your nurse gave you Jackson-Pratt Drainage Record  Pen or pencil  Instructions Clean an area to work on. Clean your hands thoroughly. Unplug the stopper on top of your Jackson-Pratt. This will cause the bulb to expand. Do not touch the inside of the stopper or the inner area of the opening on the bulb. Turn your Jackson-Pratt upside down, gently squeeze the bulb, and pour the drainage into the measuring container. Turn your Jackson-Pratt right  side up. Squeeze the bulb until your fingers feel the palm of your hand. Keep squeezing the bulb while you replug the stopper. Make sure the bulb stays fully compressed to ensure constant, gentle suction.    Check the amount and color of drainage in the measuring container. The first couple days after surgery the fluid may be dark red. This is normal. As you heal the fluid may look pink or pale yellow. Record this amount and the color of drainage on your Jackson-Pratt Drainage Record. Flush the drainage down the toilet and rinse the measuring container with water.  Caring for the  Insertion Site Once you have emptied the drainage, clean your hands again. Check the area around the insertion site. Look for tenderness, swelling, or pus. If you have any of these, or if you have a temperature of 101 F (38.3 C) or higher, you may have an infection. Call your doctors office.  Sometimes, the drain causes redness the size of a dime at your insertion site. This is normal. Your healthcare provider will tell you if you should place a bandage over the insertion site.      DAILY CARE  Keep the bulb compressed at all times, except while emptying it. The compression creates suction.   Keep sites where the tubes enter the skin dry and covered with a light bandage (dressing).   Tape the tubes to your skin, 1 to 2 inches below the insertion sites, to keep from pulling on your stitches. Tubes are stitched in place and will not slip out.   Pin the bulb to your shirt (not to your pants) with a safety pin.   For the first few days after surgery, there usually is more fluid in the bulb. Empty the bulb whenever it becomes half full because the bulb does not create enough suction if it is too full. Include this amount in your 24 hour totals.   When the amount of drainage decreases, empty the bulb at the same time every day. Write down the amounts and the 24 hour totals. Your caregiver will want to know them. This helps your caregiver know when the tubes can be removed.   (We anticipate removing the drain in 1-3 weeks, depending on when the output is <22m a day for 2+ days)  If there is drainage around the tube sites, change dressings and keep the area dry. If you see a clot in the tube, leave it alone. However, if the tube does not appear to be draining, let your caregiver know.  TO EMPTY THE BULB  Open the stopper to release suction.   Holding the stopper out of the way, pour drainage into the measuring cup that was sent home with you.   Measure and write down the amount. If there  are 2 bulbs, note the amount of drainage from bulb 1 or bulb 2 and keep the totals separate. Your caregiver will want to know which tube is draining more.   Compress the bulb by folding it in half.   Replace the stopper.   Check the tape that holds the tube to your skin, and pin the bulb to your shirt.  SEEK MEDICAL CARE IF:  The drainage develops a bad odor.   You have an oral temperature above 102 F (38.9 C).   The amount of drainage from your wound suddenly increases or decreases.   You accidentally pull out your drain.   You have any other questions or concerns.  MAKE  SURE YOU:   Understand these instructions.   Will watch your condition.   Will get help right away if you are not doing well or get worse.     Call our office if you have any questions about your drain. [phone]  Diverticulitis Diverticulitis is inflammation or infection of small pouches in your colon that form when you have a condition called diverticulosis. The pouches in your colon are called diverticula. Your colon, or large intestine, is where water is absorbed and stool is formed. Complications of diverticulitis can include:  Bleeding.  Severe infection.  Severe pain.  Perforation of your colon.  Obstruction of your colon. CAUSES  Diverticulitis is caused by bacteria. Diverticulitis happens when stool becomes trapped in diverticula. This allows bacteria to grow in the diverticula, which can lead to inflammation and infection. RISK FACTORS People with diverticulosis are at risk for diverticulitis. Eating a diet that does not include enough fiber from fruits and vegetables may make diverticulitis more likely to develop. SYMPTOMS  Symptoms of diverticulitis may include:  Abdominal pain and tenderness. The pain is normally located on the left side of the abdomen, but may occur in other areas.  Fever and  chills.  Bloating.  Cramping.  Nausea.  Vomiting.  Constipation.  Diarrhea.  Blood in your stool. DIAGNOSIS  Your health care provider will ask you about your medical history and do a physical exam. You may need to have tests done because many medical conditions can cause the same symptoms as diverticulitis. Tests may include:  Blood tests.  Urine tests.  Imaging tests of the abdomen, including X-rays and CT scans. When your condition is under control, your health care provider may recommend that you have a colonoscopy. A colonoscopy can show how severe your diverticula are and whether something else is causing your symptoms. TREATMENT  Most cases of diverticulitis are mild and can be treated at home. Treatment may include:  Taking over-the-counter pain medicines.  Following a clear liquid diet.  Taking antibiotic medicines by mouth for 7-10 days. More severe cases may be treated at a hospital. Treatment may include:  Not eating or drinking.  Taking prescription pain medicine.  Receiving antibiotic medicines through an IV tube.  Receiving fluids and nutrition through an IV tube.  Surgery. HOME CARE INSTRUCTIONS   Follow your health care provider's instructions carefully.  Follow a full liquid diet or other diet as directed by your health care provider. After your symptoms improve, your health care provider may tell you to change your diet. He or she may recommend you eat a high-fiber diet. Fruits and vegetables are good sources of fiber. Fiber makes it easier to pass stool.  Take fiber supplements or probiotics as directed by your health care provider.  Only take medicines as directed by your health care provider.  Keep all your follow-up appointments. SEEK MEDICAL CARE IF:   Your pain does not improve.  You have a hard time eating food.  Your bowel movements do not return to normal. SEEK IMMEDIATE MEDICAL CARE IF:   Your pain becomes worse.  Your  symptoms do not get better.  Your symptoms suddenly get worse.  You have a fever.  You have repeated vomiting.  You have bloody or black, tarry stools. MAKE SURE YOU:   Understand these instructions.  Will watch your condition.  Will get help right away if you are not doing well or get worse.   This information is not intended to replace advice given  to you by your health care provider. Make sure you discuss any questions you have with your health care provider.   Document Released: 04/10/2005 Document Revised: 07/06/2013 Document Reviewed: 05/26/2013 Elsevier Interactive Patient Education 2016 [place]. Irregular bowel habits such as constipation and diarrhea can lead to many problems over time.  Having one soft bowel movement a day is the most important way to prevent further problems.  The anorectal canal is designed to handle stretching and feces to safely manage our ability to get rid of solid waste (feces, poop, stool) out of our body.  BUT, hard constipated stools can act like ripping concrete bricks and diarrhea can be a burning fire to this very sensitive area of our body, causing inflamed hemorrhoids, anal fissures, increasing risk is perirectal abscesses, abdominal pain/bloating, an making irritable bowel worse.      The goal: ONE SOFT BOWEL MOVEMENT A DAY!  To have soft, regular bowel movements:   Drink plenty of fluids, consider 4-6 tall glasses of water a day.    Take plenty of fiber.  Fiber is the undigested part of plant food that passes into the colon, acting s natures broom to encourage bowel motility and movement.  Fiber can absorb and hold large amounts of water. This results in a larger, bulkier stool, which is soft and easier to pass. Work gradually over several weeks up to 6 servings a day of fiber (25g a day even more if needed) in the form of: o Vegetables -- Root (potatoes, carrots, turnips), leafy green (lettuce, salad  greens, celery, spinach), or cooked high residue (cabbage, broccoli, etc) o Fruit -- Fresh (unpeeled skin & pulp), Dried (prunes, apricots, cherries, etc ),  or stewed ( applesauce)  o Whole grain breads, pasta, etc (whole wheat)  o Bran cereals   Bulking Agents -- This type of water-retaining fiber generally is easily obtained each day by one of the following:  o Psyllium bran -- The psyllium plant is remarkable because its ground seeds can retain so much water. This product is available as Metamucil, Konsyl, Effersyllium, Per Diem Fiber, or the less expensive generic preparation in drug and health food stores. Although labeled a laxative, it really is not a laxative.  o Methylcellulose -- This is another fiber derived from wood which also retains water. It is available as Citrucel. o Polyethylene Glycol - and artificial fiber commonly called Miralax or Glycolax.  It is helpful for people with gassy or bloated feelings with regular fiber o Flax Seed - a less gassy fiber than psyllium  No reading or other relaxing activity while on the toilet. If bowel movements take longer than 5 minutes, you are too constipated  AVOID CONSTIPATION.  High fiber and water intake usually takes care of this.  Sometimes a laxative is needed to stimulate more frequent bowel movements, but   Laxatives are not a good long-term solution as it can wear the colon out.  They can help jump-start bowels if constipated, but should be relied on constantly without discussing with your doctor o Osmotics (Milk of Magnesia, Fleets phosphosoda, Magnesium citrate, MiraLax, GoLytely) are safer than  o Stimulants (Senokot, Castor Oil, Dulcolax, Ex Lax)    o Avoid taking laxatives for more than 7 days in a row.   IF SEVERELY CONSTIPATED, try a Bowel Retraining Program: o Do not use laxatives.  o Eat a diet high in roughage, such as bran cereals and leafy vegetables.  o Drink  six (6) ounces of prune or apricot juice each morning.   o Eat two (2) large servings of stewed fruit each day.  o Take one (1) heaping tablespoon of a psyllium-based bulking agent twice a day. Use sugar-free sweetener when possible to avoid excessive calories.  o Eat a normal breakfast.  o Set aside 15 minutes after breakfast to sit on the toilet, but do not strain to have a bowel movement.  o If you do not have a bowel movement by the third day, use an enema and repeat the above steps.   Controlling diarrhea o Switch to liquids and simpler foods for a few days to avoid stressing your intestines further. o Avoid dairy products (especially milk & ice cream) for a short time.  The intestines often can lose the ability to digest lactose when stressed. o Avoid foods that cause gassiness or bloating.  Typical foods include beans and other legumes, cabbage, broccoli, and dairy foods.  Every person has some sensitivity to other foods, so listen to our body and avoid those foods that trigger problems for you. o Adding fiber (Citrucel, Metamucil, psyllium, Miralax) gradually can help thicken stools by absorbing excess fluid and retrain the intestines to act more normally.  Slowly increase the dose over a few weeks.  Too much fiber too soon can backfire and cause cramping & bloating. o Probiotics (such as active yogurt, Align, etc) may help repopulate the intestines and colon with normal bacteria and calm down a sensitive digestive tract.  Most studies show it to be of mild help, though, and such products can be costly. o Medicines: - Bismuth subsalicylate (ex. Kayopectate, Pepto Bismol) every 30 minutes for up to 6 doses can help control diarrhea.  Avoid if pregnant. - Loperamide (Immodium) can slow down diarrhea.  Start with two tablets (34m total) first and then try one tablet every 6 hours.  Avoid if you are having fevers or severe pain.  If you are not better or start feeling worse, stop all medicines and call your doctor for advice o Call your doctor if you  are getting worse or not better.  Sometimes further testing (cultures, endoscopy, X-ray studies, bloodwork, etc) may be needed to help diagnose and treat the cause of the diarrhea.  TROUBLESHOOTING IRREGULAR BOWELS 1) Avoid extremes of bowel movements (no bad constipation/diarrhea) 2) Miralax 17gm mixed in 8oz. water or juice-daily. May use BID as needed.  3) Gas-x,Phazyme, etc. as needed for gas & bloating.  4) Soft,bland diet. No spicy,greasy,fried foods.  5) Prilosec over-the-counter as needed  6) May hold gluten/wheat products from diet to see if symptoms improve.  7)  May try probiotics (Align, Activa, etc) to help calm the bowels down 7) If symptoms become worse call back immediately.  Exercising to Stay Healthy Exercising regularly is important. It has many health benefits, such as:  Improving your overall fitness, flexibility, and endurance.  Increasing your bone density.  Helping with weight control.  Decreasing your body fat.  Increasing your muscle strength.  Reducing stress and tension.  Improving your overall health. In order to become healthy and stay healthy, it is recommended that you do moderate-intensity and vigorous-intensity exercise. You can tell that you are exercising at a moderate intensity if you have a higher heart rate and faster breathing, but you are still able to hold a conversation. You can tell that you are exercising at a vigorous intensity if you are breathing much harder and faster and cannot hold a  conversation while exercising. HOW OFTEN SHOULD I EXERCISE? Choose an activity that you enjoy and set realistic goals. Your health care provider can help you to make an activity plan that works for you. Exercise regularly as directed by your health care provider. This may include:   Doing resistance training twice each week, such as:  Push-ups.  Sit-ups.  Lifting weights.  Using resistance bands.  Doing a given intensity of exercise for a given  amount of time. Choose from these options:  150 minutes of moderate-intensity exercise every week.  75 minutes of vigorous-intensity exercise every week.  A mix of moderate-intensity and vigorous-intensity exercise every week. Children, pregnant women, people who are out of shape, people who are overweight, and older adults may need to consult a health care provider for individual recommendations. If you have any sort of medical condition, be sure to consult your health care provider before starting a new exercise program.  WHAT ARE SOME EXERCISE IDEAS? Some moderate-intensity exercise ideas include:   Walking at a rate of 1 mile in 15 minutes.  Biking.  Hiking.  Golfing.  Dancing. Some vigorous-intensity exercise ideas include:   Walking at a rate of at least 4.5 miles per hour.  Jogging or running at a rate of 5 miles per hour.  Biking at a rate of at least 10 miles per hour.  Lap swimming.  Roller-skating or in-line skating.  Cross-country skiing.  Vigorous competitive sports, such as football, basketball, and soccer.  Jumping rope.  Aerobic dancing. WHAT ARE SOME EVERYDAY ACTIVITIES THAT CAN HELP ME TO GET EXERCISE?  Yard work, such as:  Psychologist, educational.  Raking and bagging leaves.  Washing and waxing your car.  Pushing a stroller.  Shoveling snow.  Gardening.  Washing windows or floors. HOW CAN I BE MORE ACTIVE IN MY DAY-TO-DAY ACTIVITIES?  Use the stairs instead of the elevator.  Take a walk during your lunch break.  If you drive, park your car farther away from work or school.  If you take public transportation, get off one stop early and walk the rest of the way.  Make all of your phone calls while standing up and walking around.  Get up, stretch, and walk around every 30 minutes throughout the day. WHAT GUIDELINES SHOULD I FOLLOW WHILE EXERCISING?  Do not exercise so much that you hurt yourself, feel dizzy, or get very short of  breath.  Consult your health care provider before starting a new exercise program.  Wear comfortable clothes and shoes with good support.  Drink plenty of water while you exercise to prevent dehydration or heat stroke. Body water is lost during exercise and must be replaced.  Work out until you breathe faster and your heart beats faster.   This information is not intended to replace advice given to you by your health care provider. Make sure you discuss any questions you have with your health care provider.   Document Released: 08/03/2010 Document Revised: 07/22/2014 Document Reviewed: 12/02/2013 Elsevier Interactive Patient Education [employer].

## 2015-06-25 NOTE — Progress Notes (Signed)
Status post drain placement.  Tired & sedated but pain controlled.  Drain w thin feculent drainage.  Husband at bedside.  Vital signs stable.  We will continue antibiotics and follow.  Probably keep on liquids only.

## 2015-06-26 LAB — CBC
HCT: 22.4 % — ABNORMAL LOW (ref 36.0–46.0)
HEMOGLOBIN: 6.7 g/dL — AB (ref 12.0–15.0)
MCH: 26.2 pg (ref 26.0–34.0)
MCHC: 29.9 g/dL — ABNORMAL LOW (ref 30.0–36.0)
MCV: 87.5 fL (ref 78.0–100.0)
Platelets: 389 10*3/uL (ref 150–400)
RBC: 2.56 MIL/uL — AB (ref 3.87–5.11)
RDW: 18.1 % — ABNORMAL HIGH (ref 11.5–15.5)
WBC: 5.9 10*3/uL (ref 4.0–10.5)

## 2015-06-26 LAB — MAGNESIUM: MAGNESIUM: 1.9 mg/dL (ref 1.7–2.4)

## 2015-06-26 LAB — BASIC METABOLIC PANEL
Anion gap: 10 (ref 5–15)
BUN: 8 mg/dL (ref 6–20)
CHLORIDE: 103 mmol/L (ref 101–111)
CO2: 26 mmol/L (ref 22–32)
Calcium: 9 mg/dL (ref 8.9–10.3)
Creatinine, Ser: 0.48 mg/dL (ref 0.44–1.00)
GFR calc Af Amer: >60 mL/min (ref >60)
GFR calc non Af Amer: >60 mL/min (ref >60)
Glucose, Bld: 103 mg/dL — ABNORMAL HIGH (ref 65–99)
POTASSIUM: 3.7 mmol/L (ref 3.5–5.1)
SODIUM: 139 mmol/L (ref 135–145)

## 2015-06-26 LAB — PREALBUMIN: Prealbumin: 6.1 mg/dL — ABNORMAL LOW (ref 18–38)

## 2015-06-26 MED ORDER — NYSTATIN 100000 UNIT/GM EX POWD
Freq: Two times a day (BID) | CUTANEOUS | Status: DC
  Administered 2015-06-26 – 2015-06-28 (×3): via TOPICAL
  Filled 2015-06-26: qty 15

## 2015-06-26 MED ORDER — ACETAMINOPHEN 500 MG PO TABS
500.0000 mg | ORAL_TABLET | Freq: Three times a day (TID) | ORAL | Status: DC
  Administered 2015-06-26 (×2): 500 mg via ORAL
  Filled 2015-06-26 (×6): qty 1

## 2015-06-26 NOTE — Progress Notes (Signed)
Initial Nutrition Assessment  DOCUMENTATION CODES:   Severe malnutrition in context of chronic illness, Morbid obesity  INTERVENTION:   Continue Ensure Enlive po BID, each supplement provides 350 kcal and 20 grams of protein Encourage PO intake RD to continue to monitor  NUTRITION DIAGNOSIS:   Malnutrition related to chronic illness as evidenced by percent weight loss, energy intake < or equal to 75% for > or equal to 1 month.  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 She underwent neoadjuvant chemoradiation therapy and then Low anterior resection on 11/10. Did well for a bit then developed abd pain and pressure. CT scan yesterday shows pelvic abscess with concern for anastomotic leak.  Patient in room with husband at bedside. Per patient she has not been eating much at all since her first surgery (11/10 XI robotic assisted lower anterior resection ). Pt's husband states she will eat a few bites of her meals and not eat any more. Pt did state she had some jello and juice this morning and for lunch she had most of her grilled cheese and some mashed potatoes. Pt has consumed 1/2 of an Ensure as well. Encouraged her to continue to sip on Ensure supplements for added protein.  Pt has lost 18 lb since 11/13 (5% weight loss x 1 month, significant for time frame).  Nutrition focused physical exam shows no sign of depletion of muscle mass or body fat.  Labs reviewed: Mg WNL  Diet Order:  Diet - low sodium heart healthy DIET SOFT Room service appropriate?: Yes; Fluid consistency:: Thin  Skin:  Reviewed, no issues  Last BM:  12/11  Height:   Ht Readings from Last 1 Encounters:  06/24/15 5' 10.5" (1.791 m)    Weight:   Wt Readings from Last 1  Encounters:  06/24/15 323 lb (146.512 kg)    Ideal Body Weight:  70.5 kg  BMI:  Body mass index is 45.68 kg/(m^2).  Estimated Nutritional Needs:   Kcal:  2200-2400  Protein:  105-115g  Fluid:  2.2L/day  EDUCATION NEEDS:   Education needs addressed  Clayton Bibles, MS, RD, LDN Pager: [phone] After Hours Pager: [phone]

## 2015-06-26 NOTE — Progress Notes (Signed)
[name], female   DOB: 23-Apr-1966, 49 y.o.   MRN: [id number]          Subjective: Pt feeling a little better; still has intermittent loose stools, nausea   Allergies: Caine-1; Penicillins; and Sulfa antibiotics  Medications: Prior to Admission medications   Medication Sig Start Date End Date Taking? Authorizing Provider  ciprofloxacin (CIPRO) 500 MG tablet Take 500 mg by mouth 2 (two) times daily. For 5 days 06/21/2015-06/25/2015 06/21/15  Yes Historical Provider, MD  clotrimazole (MYCELEX) 10 MG troche Take 10 mg by mouth 3 (three) times daily.  06/13/15  Yes Historical Provider, MD  Coenzyme Q10 (COQ-10) 100 MG CAPS Take 100 mg by mouth daily.   Yes Historical Provider, MD  DULoxetine (CYMBALTA) 60 MG capsule Take 60 mg by mouth at bedtime.    Yes Historical Provider, MD  esomeprazole (NEXIUM) 40 MG capsule Take 40 mg by mouth 2 (two) times daily.   Yes Historical Provider, MD  furosemide (LASIX) 20 MG tablet Take 20 mg by mouth as needed for fluid or edema.    Yes Historical Provider, MD  HYDROcodone-acetaminophen (NORCO) 10-325 MG tablet Take 1-2 tablets by mouth every 6 (six) hours as needed for moderate pain or severe pain. Patient taking differently: Take 1-2 tablets by mouth every 4 (four) hours as needed for moderate pain or severe pain.  05/25/15  Yes Michael Boston, MD  hydrocortisone 2.5 % cream Apply 1 application topically as needed (rash).    Yes Historical Provider, MD  hyoscyamine (LEVBID) 0.375 MG 12 hr tablet Take 0.375 mg by mouth 2 (two) times daily.   Yes Historical Provider, MD  meloxicam (MOBIC) 15 MG tablet Take 15 mg by mouth daily.   Yes Historical Provider, MD  methocarbamol (ROBAXIN) 500 MG tablet Take 500 mg by mouth 4 (four) times daily as needed. 06/06/15  Yes Historical Provider, MD  nebivolol (BYSTOLIC) 10 MG tablet Take 10 mg by mouth at bedtime.    Yes Historical Provider, MD  promethazine (PHENERGAN) 25 MG tablet Take 25 mg by  mouth every 6 (six) hours as needed for nausea or vomiting.   Yes Historical Provider, MD  rosuvastatin (CRESTOR) 5 MG tablet Take 5 mg by mouth at bedtime.    Yes Historical Provider, MD  traMADol (ULTRAM) 50 MG tablet Take 50 mg by mouth every 4 (four) hours as needed. pain 06/13/15  Yes Historical Provider, MD  Vitamin D, Ergocalciferol, (DRISDOL) 50000 UNITS CAPS capsule Take 50,000 Units by mouth 2 (two) times a week.   Yes Historical Provider, MD  lidocaine-prilocaine (EMLA) cream Apply to affected area once Patient not taking: Reported on 06/23/2015 06/14/15   Truitt Merle, MD  ondansetron (ZOFRAN) 8 MG tablet Take 1 tablet (8 mg total) by mouth 2 (two) times daily. Start the day after chemo for 2 days. Then take as needed for nausea or vomiting. Patient not taking: Reported on 06/23/2015 06/14/15   Truitt Merle, MD     Vital Signs: BP 117/61 mmHg  Pulse 78  Temp(Src) 98.3 F (36.8 C) (Oral)  Resp 18  Ht 5' 10.5" (1.791 m)  Wt 323 lb (146.512 kg)  BMI 45.68 kg/m2  SpO2 95%  Physical Exam right TG drain intact, insertion site ok, mildly tender, output 15 cc feculent material; cx's pend  Imaging: Dg Pelvis 1-2 Views  06/24/2015  CLINICAL DATA:  Pain EXAM: PELVIS - 1-2 VIEW COMPARISON:  None. FINDINGS: There is no evidence of pelvic fracture or  dislocation. Joint spaces appear intact. Visualized bowel gas pattern is normal. There are phleboliths in the pelvis. IMPRESSION: No bony abnormality.  Visualized bowel gas pattern unremarkable. Electronically Signed   By: Lowella Grip III M.D.   On: 06/24/2015 09:42   Ct Abdomen Pelvis W Contrast  06/23/2015  CLINICAL DATA:  Rectosigmoid colon resection for rectal cancer on 05/25/2015. Rectal pain and bleeding for 2 weeks. Assess for possible abscess. EXAM: CT ABDOMEN AND PELVIS WITH CONTRAST TECHNIQUE: Multidetector CT imaging of the abdomen and pelvis was performed using the standard protocol following bolus administration of intravenous  contrast. CONTRAST:  184m ISOVUE-300 IOPAMIDOL (ISOVUE-300) INJECTION 61% COMPARISON:  MR abdomen 02/05/2015.  CT abdomen 01/20/2015 FINDINGS: Lung bases are clear. No pleural or pericardial fluid. Known hemangiomas are again demonstrated in the liver, unchanged from the previous studies. No evidence of hepatic metastatic disease. No calcified gallstones. The spleen is normal. The pancreas is normal. The adrenal glands are normal. The kidneys are normal. The aorta and IVC are normal. No retroperitoneal mass or lymphadenopathy in the region of the abdomen. No acute bowel pathology in the region of the abdomen. In the pelvis, the patient has had previous hysterectomy. There has been recent rectosigmoid surgery with loss of the fat plane between the rectosigmoid colon in the sacrum/coccyx. There is stool material within the rectum. It appears that there is a direct reanastomosis. There is an unusual air cavity in that region raising the possibility of dehiscence at the anastomosis with some contained air/gas. I cannot be completely sure that there is not an extraluminal abscess, and am suspicious of that. It may be necessary to perform a water soluble contrast study to completely understand this anatomy. IMPRESSION: Postoperative changes at the rectosigmoid region. Worrisome gas collection with unusual characteristics that suggests the presence of dehiscence of the anastomosis with a contained rupture. Possible abscess in that region, as it is difficult to define the exact margins of the lumen of the rectosigmoid. I am in the process of calling these results. Electronically Signed   By: MNelson ChimesM.D.   On: 06/23/2015 14:53   Dg Abd Portable 2v  06/24/2015  CLINICAL DATA:  Abdominal pain and diarrhea.  Colon can't EXAM: PORTABLE ABDOMEN - 2 VIEW COMPARISON:  CT 06/23/2015 FINDINGS: No dilated loops of large or small bowel. There is gas and stool throughout the colon and rectosigmoid colon. Surgical clips noted  in the lower pelvis. No intraperitoneal free air. IMPRESSION: No evidence of bowel obstruction or intraperitoneal free air. Electronically Signed   By: SSuzy BouchardM.D.   On: 06/24/2015 09:20   Ct Image Guide Drain Transvag Transrect Peritoneal Retroper  06/24/2015  INDICATION: History of colon cancer, post robotic assisted low anterior resection, now with pelvic pain and fever with CT findings worrisome for perianastomotic leak. Please perform CT-guided percutaneous drainage catheter placement. EXAM: CT IMAGE GUIDE DRAIN TRANSVAG TRANSRECT PERITONEAL RETROPER COMPARISON:  CT abdomen pelvis - 06/23/2015; 01/20/2015 MEDICATIONS: The patient is currently admitted to the hospital and receiving intravenous antibiotics. The antibiotics were administered within an appropriate time frame prior to the initiation of the procedure. ANESTHESIA/SEDATION: Fentanyl 100 mcg IV; Versed 6 mg IV Total Moderate Sedation time 19 minutes CONTRAST:  None COMPLICATIONS: None immediate PROCEDURE: Informed written consent was obtained from the patient after a discussion of the risks, benefits and alternatives to treatment. The patient was placed prone on the CT gantry and a pre procedural CT was performed re-demonstrating the known abscess/fluid collection within  the presacral space with dominant component measuring approximately 7.2 x 4.5 cm (image 19, series 3). The procedure was planned. A timeout was performed prior to the initiation of the procedure. The skin overlying the medial aspect the right buttocks was prepped and draped in the usual sterile fashion. The overlying soft tissues were anesthetized with 1% lidocaine with epinephrine. Appropriate trajectory was planned with the use of a 22 gauge spinal needle. An 18 gauge trocar needle was advanced into the abscess/fluid collection and a short Amplatz super stiff wire was coiled within the collection. Appropriate positioning was confirmed with a limited CT scan. The tract  was serially dilated allowing placement of a 10 [place] all-purpose drainage catheter. Appropriate positioning was confirmed with a limited postprocedural CT scan. Approximately 20 cc of feculent material was aspirated. Given the feculent appearance of the aspirated fluid, the drainage catheter was connected to a JP bulb. The drainage catheter was sutured in place. A dressing was placed. The patient tolerated the procedure well without immediate post procedural complication. IMPRESSION: Successful CT guided placement of a 10 French all purpose drain catheter into the pelvis via right trans gluteal approach with aspiration of 20 mL of purulent fluid. Samples were sent to the laboratory as requested by the ordering clinical team. Electronically Signed   By: Sandi Mariscal M.D.   On: 06/24/2015 13:06    Labs:  CBC:  Recent Labs  06/14/15 0829 06/23/15 2000 06/24/15 0630 06/26/15 0001  WBC 9.9 8.2 6.3 5.9  HGB 8.3* 7.4* 6.5* 6.7*  HCT 26.6* 24.7* 21.5* 22.4*  PLT 537* 421* 362 389    COAGS:  Recent Labs  06/24/15 0630  INR 1.22    BMP:  Recent Labs  05/18/15 1450 05/26/15 0552 06/14/15 0829 06/23/15 2000 06/24/15 0630 06/26/15 0001  NA 140 137 141  --  132* 139  K 4.0 4.3 4.3  --  3.2* 3.7  CL 107 106  --   --  99* 103  CO2 27 27 27   --  25 26  GLUCOSE 144* 115* 99  --  94 103*  BUN 14 10 11.1  --  8 8  CALCIUM 9.7 9.1 10.2  --  8.8* 9.0  CREATININE 0.68 0.60 0.7 0.62 0.59 0.48  GFRNONAA >60 >60  --  >60 >60 >60  GFRAA >60 >60  --  >60 >60 >60    LIVER FUNCTION TESTS:  Recent Labs  03/21/15 0929 03/27/15 0956 04/18/15 0900 06/14/15 0829  BILITOT 0.41 0.31 0.33 <0.30  AST 11 14 10 10   ALT 16 16 13 12   ALKPHOS 62 64 72 78  PROT 6.8 6.6 7.0 7.5  ALBUMIN 3.7 3.6 3.7 2.7*    Assessment and Plan: S/p LAR 05/25/15 for mid rectal cancer; s/p drainage of right pelvic abscess 12/10; afebrile; WBC/creat nl; abscess cx's pend; hgb 6.7(6.5)- transfuse prn; cont current  tx; will need f/u CT/drain injection within 1 week of drain placement.   Signed: D. Rowe Robert 06/26/2015, 2:16 PM   I spent a total of 15 minutes at the the patient's bedside AND on the patient's hospital floor or unit, greater than 50% of which was counseling/coordinating care for pelvic abscess drain

## 2015-06-26 NOTE — Progress Notes (Signed)
[facility]  [address] [place], [place] [address] Phone: [phone] FAX: [phone]   OLA RAAP [id number] 1965-08-22   Assessment  Pelvic abscess w probable contained leak  Problem List:   Principal Problem:   Pelvic abscess in female Active Problems:   Morbid obesity with BMI of 50.0-59.9, adult ([facility])   Rectal adenocarcinoma s/p LAR resection 05/25/2015   Hypertension   Depression   Anemia of chronic disease   Protein-calorie malnutrition, moderate ([facility]CC)   Plan:  -Pelvic fluid collection drained.  Teach care -IV ABx - f/u Cx -retry PO w supp shakes.  Check nutrition labs -adv diet to solids -control diarrhea.  Inc imodium & add PO iron -loop ileostomy if worsens -anemia s/p iron - follow.  Transfuse if worse -anxiolysis -VTE prophylaxis- SCDs, etc -mobilize as tolerated to help recovery  [name], M.D., F.A.C.S. Gastrointestinal and Minimally Invasive Surgery [facility] [address] [place],  [address] [phone] Main / Paging   06/26/2015  Subjective:  Less sore right inner pelvis.  Husband at bedside.  Rather anemic.  Received IV iron infusion day before admission earlier this week.  Gets some nausea w iron but trying it PO  Still w loose BMs hard to control  Objective:  Vital signs:  Filed Vitals:   06/25/15 1350 06/25/15 1723 06/25/15 2200 06/26/15 0521  BP: 128/72 122/68 124/53 117/61  Pulse: 71 72 76 78  Temp: 98.2 F (36.8 C) 98.1 F (36.7 C) 98.2 F (36.8 C) 98.3 F (36.8 C)  TempSrc: Oral Oral Oral Oral  Resp: _0 Height:      Weight:      SpO2: 96% 100% 94% 95%    Last BM Date: 06/25/15  Intake/Output   Yesterday:  12/11 0701 - 12/12 0700 In: 2081.3 [I.V.:2071.3] Out: 315 [Urine:300; Drains:15] This shift:     Bowel function:  Flatus: y  BM: yes.  losse but a little better formed  Drain:  n/a  Physical Exam:  General: Pt awake/alert/oriented x4 in moderate acute distress. PALE. Not toxic. Tried Eyes: PERRL, normal EOM. Sclera nonicteric Neuro: CN II-XII intact w/o focal sensory/motor deficits. Lymph: No head/neck/groin lymphadenopathy Psych: No delerium/psychosis/paranoia. Anxious. Rest less chronically shaking her legs. Consolable HENT: Normocephalic, Mucus membranes moist. No thrush Neck: Supple, No tracheal deviation Chest: No pain. Good respiratory excursion. CV: Pulses intact. Regular rhythm Abdomen: Soft, Nondistended. Morbidly obese. Incisions well-healed. No cellulitis or infection at extraction incision. Mild discomfort left suprapubic region. No incarcerated hernias. Normal external genitalia. No vaginal bleeding or discharge. No hernias. Rectal:. Perianal region sore. Perianal skin clean. Ext: SCDs BLE. No significant edema. No cyanosis Skin: No petechiae / purpurea. No major sores Musculoskeletal: No severe joint pain. Good ROM major joints  Results:   Labs: Results for orders placed or performed during the hospital encounter of 06/23/15 (from the past 48 hour(s))  Culture, routine-abscess     Status: None (Preliminary result)   Collection Time: 06/24/15 12:11 PM  Result Value Ref Range   Specimen Description ABSCESS BUTTOCKS RIGHT    Special Requests NONE    Gram Stain      FEW WBC PRESENT, PREDOMINANTLY PMN NO SQUAMOUS EPITHELIAL CELLS SEEN ABUNDANT GRAM NEGATIVE RODS ABUNDANT GRAM POSITIVE RODS ABUNDANT GRAM POSITIVE COCCI IN PAIRS    Culture PENDING    Report Status PENDING   CBC     Status: Abnormal   Collection  Time: 06/26/15 12:01 AM  Result Value Ref Range   WBC 5.9 4.0 - 10.5 K/uL   RBC 2.56 (L) 3.87 - 5.11 MIL/uL   Hemoglobin 6.7 (LL) 12.0 - 15.0 g/dL    Comment: REPEATED TO VERIFY CRITICAL VALUE NOTED.  VALUE IS CONSISTENT WITH PREVIOUSLY REPORTED AND CALLED VALUE.    HCT 22.4 (L) 36.0 - 46.0 %   MCV 87.5  78.0 - 100.0 fL   MCH 26.2 26.0 - 34.0 pg   MCHC 29.9 (L) 30.0 - 36.0 g/dL   RDW 18.1 (H) 11.5 - 15.5 %   Platelets 389 150 - 400 K/uL  Basic metabolic panel     Status: Abnormal   Collection Time: 06/26/15 12:01 AM  Result Value Ref Range   Sodium 139 135 - 145 mmol/L    Comment: DELTA CHECK NOTED REPEATED TO VERIFY    Potassium 3.7 3.5 - 5.1 mmol/L   Chloride 103 101 - 111 mmol/L   CO2 26 22 - 32 mmol/L   Glucose, Bld 103 (H) 65 - 99 mg/dL   BUN 8 6 - 20 mg/dL   Creatinine, Ser 0.48 0.44 - 1.00 mg/dL   Calcium 9.0 8.9 - 10.3 mg/dL   GFR calc non Af Amer >60 >60 mL/min   GFR calc Af Amer >60 >60 mL/min    Comment: (NOTE) The eGFR has been calculated using the CKD EPI equation. This calculation has not been validated in all clinical situations. eGFR's persistently <60 mL/min signify possible Chronic Kidney Disease.    Anion gap 10 5 - 15  Magnesium     Status: None   Collection Time: 06/26/15 12:01 AM  Result Value Ref Range   Magnesium 1.9 1.7 - 2.4 mg/dL    Imaging / Studies: Dg Pelvis 1-2 Views  06/24/2015  CLINICAL DATA:  Pain EXAM: PELVIS - 1-2 VIEW COMPARISON:  None. FINDINGS: There is no evidence of pelvic fracture or dislocation. Joint spaces appear intact. Visualized bowel gas pattern is normal. There are phleboliths in the pelvis. IMPRESSION: No bony abnormality.  Visualized bowel gas pattern unremarkable. Electronically Signed   By: Lowella Grip III M.D.   On: 06/24/2015 09:42   Dg Abd Portable 2v  06/24/2015  CLINICAL DATA:  Abdominal pain and diarrhea.  Colon can't EXAM: PORTABLE ABDOMEN - 2 VIEW COMPARISON:  CT 06/23/2015 FINDINGS: No dilated loops of large or small bowel. There is gas and stool throughout the colon and rectosigmoid colon. Surgical clips noted in the lower pelvis. No intraperitoneal free air. IMPRESSION: No evidence of bowel obstruction or intraperitoneal free air. Electronically Signed   By: Suzy Bouchard M.D.   On: 06/24/2015 09:20    Ct Image Guide Drain Transvag Transrect Peritoneal Retroper  06/24/2015  INDICATION: History of colon cancer, post robotic assisted low anterior resection, now with pelvic pain and fever with CT findings worrisome for perianastomotic leak. Please perform CT-guided percutaneous drainage catheter placement. EXAM: CT IMAGE GUIDE DRAIN TRANSVAG TRANSRECT PERITONEAL RETROPER COMPARISON:  CT abdomen pelvis - 06/23/2015; 01/20/2015 MEDICATIONS: The patient is currently admitted to the hospital and receiving intravenous antibiotics. The antibiotics were administered within an appropriate time frame prior to the initiation of the procedure. ANESTHESIA/SEDATION: Fentanyl 100 mcg IV; Versed 6 mg IV Total Moderate Sedation time 19 minutes CONTRAST:  None COMPLICATIONS: None immediate PROCEDURE: Informed written consent was obtained from the patient after a discussion of the risks, benefits and alternatives to treatment. The patient was placed prone on the CT gantry and  a pre procedural CT was performed re-demonstrating the known abscess/fluid collection within the presacral space with dominant component measuring approximately 7.2 x 4.5 cm (image 19, series 3). The procedure was planned. A timeout was performed prior to the initiation of the procedure. The skin overlying the medial aspect the right buttocks was prepped and draped in the usual sterile fashion. The overlying soft tissues were anesthetized with 1% lidocaine with epinephrine. Appropriate trajectory was planned with the use of a 22 gauge spinal needle. An 18 gauge trocar needle was advanced into the abscess/fluid collection and a short Amplatz super stiff wire was coiled within the collection. Appropriate positioning was confirmed with a limited CT scan. The tract was serially dilated allowing placement of a 10 [place] all-purpose drainage catheter. Appropriate positioning was confirmed with a limited postprocedural CT scan. Approximately 20 cc of feculent  material was aspirated. Given the feculent appearance of the aspirated fluid, the drainage catheter was connected to a JP bulb. The drainage catheter was sutured in place. A dressing was placed. The patient tolerated the procedure well without immediate post procedural complication. IMPRESSION: Successful CT guided placement of a 10 [place] all purpose drain catheter into the pelvis via right trans gluteal approach with aspiration of 20 mL of purulent fluid. Samples were sent to the laboratory as requested by the ordering clinical team. Electronically Signed   By: Sandi Mariscal M.D.   On: 06/24/2015 13:06    Medications / Allergies: per chart  Antibiotics: Anti-infectives    Start     Dose/Rate Route Frequency Ordered Stop   06/23/15 1800  ciprofloxacin (CIPRO) IVPB 400 mg     400 mg 200 mL/hr over 60 Minutes Intravenous Every 12 hours 06/23/15 1647     06/23/15 1800  metroNIDAZOLE (FLAGYL) IVPB 500 mg     500 mg 100 mL/hr over 60 Minutes Intravenous Every 6 hours 06/23/15 1647          POST-OPERATIVE DIAGNOSIS:   Proximal Rectal Cancer  SURGERY: 05/25/2015  XI ROBOTIC ASSISTED RECTOSIGMOID LOWER ANTERIOR RESECTION Laparoscopic and robotic lysis of adhesions X 60 MINUTES RIGID PROCTOSCOPY RIGHT SALPINGO-OOPHORECTOMY INSERTION PORT-A-CATH in R IJ  SURGEON:   Surgeon(s): Michael Boston, MD Leighton Ruff, MD  OR FINDINGS:   Patient had a bulky tumor in proximal and mid rectum. Most of it focused on the right anterolateral circumference. Still came down to around 9 cm from the anal verge.  I enlarged right ovary was some firm and mainly cystic areas. At least 6 cm in largest dimension. Left side looked atrophic and normal. Right ovary removed.  PowerPort is a ClearVUE 8 [place]. Reference #[id number] Lot number [id number] It is in the right internal jugular vein. The tip sits at the right atrial/superior vena caval junction  No obvious metastatic disease on visceral parietal  peritoneum or liver.  The anastomosis rests 5-6 cm from the anal verge by rigid proctoscopy  Diagnosis 1. Colon, segmental resection for tumor, recto-sigmoid INVASIVE COLONIC ADENOCARCINOMA (0.2 CM IN GREATEST DIMENSION RESIDUAL TUMOR) THE TUMOR INVADE SUBMUCOSA (0.3 CM IN DEPTH, PT1) MARGINS OF RESECTION ARE NEGATIVE FOR TUMOR TWELVE BENIGN LYMPH NODES (0/12) 2. Colon, resection margin (donut), proximal anastomotic ring BENIGN COLONIC TISSUE, NEGATIVE FOR MALIGANCY 3. Colon, resection margin (donut), distal anastomotic ring BENIGN COLONIC TISSUE, NEGATIVE FOR MALIGNANCY 4. Ovary, right BENIGN SEROUS CYSTADENOMA Microscopic Comment 1. COLON AND RECTUM (INCLUDING TRANS-ANAL RESECTION): Specimen: Colon-rectum Procedure: Segmental resection Tumor site: Rectum anterior wall mid to distal third of rectum Specimen integrity:  intact Macroscopic intactness of mesorectum: Complete: _ Macroscopic tumor perforation: Negative Invasive tumor: Maximum size: Histologic type(s): G2 Histologic grade and differentiation: G1: well differentiated/low grade G2: moderately differentiated/low grade G3: poorly differentiated/high grade G4: undifferentiated/high grade Type of polyp in which invasive carcinoma arose: _ 1 of 3 Duplicate copy FINAL for NASIYAH, LAVERDIERE [phone]) Microscopic Comment(continued) Microscopic extension of invasive tumor:Submucosa Lymph-Vascular invasion: Not identified Peri-neural invasion: negative Tumor deposit(s) (discontinuous extramural extension): Negative Resection margins: Proximal margin: Negative Distal margin: Negative Circumferential (radial) (posterior ascending, posterior descending; lateral and posterior mid-rectum; and entire lower 1/3 rectum):Negatvie Mesenteric margin (sigmoid and transverse): Negative Distance closest margin (if all above margins negative): 2.5 cm Treatment effect (neo-adjuvant therapy): Present Additional polyp(s):  Negative Non-neoplastic findings: Unremarkable Lymph nodes: number examined 12; number positive: 0 Pathologic Staging: T1, N0, M_ Ancillary studies: Preserved expression of the makor and minor MMR proteins MLH1, MSH2, MSH6 and PMS2. MLH1: Casimer Lanius MD Pathologist, Electronic Signature (Case signed 05/31/2015) Specimen Tivon Lemoine and Clinical Information Specimen(s) Obtained: 1. Colon, segmental resection for tumor, recto-sigmoid 2. Colon, resection margin (donut), proximal anastomotic ring 3. Colon, resection margin (donut), distal anastomotic ring 4. Ovary, right Specimen Clinical Information 1. proximal rectal cancer [rd] Recardo Linn 1. Specimen: Rectosigmoid, to include at least portion of distal 1/3 of rectum. Specimen integrity: Intact Specimen length: 38 cm. 11 cm from sigmoid rectal junction to distal margin, and 4 cm from peritoneal reflection to distal margin. Mesorectal intactness: Complete Tumor location: Anterior wall, middle third to distal third of rectum Tumor size: There is a 4 cm in length and 2.2 cm in diameter area of healing ulceration with underlying indurated tan white dense tissue up to 1.1 cm thick. A discrete mass is not identified. Percent of bowel circumference involved: 40% Tumor distance to margins: Proximal: 28 cm Distal: 6 cm Radial (posterior ascending, posterior descending; lateral and posterior mid-rectum; and entire lower 1/3 rectum): The area of dense indurated tissue is 2.5 cm from the perirectal soft tissue margin. 2 of 3 Duplicate copy FINAL for MORENA, MCKISSACK [phone]) Dennies Coate(continued) Macroscopic extent of tumor invasion: No definite mass to evaluate invasion Total presumed lymph nodes: Found are seventeen possible lymph nodes ranging from 0.4 to 1.3 cm Extramural satellite tumor nodules: None Mucosal polyp(s): None Additional findings: None Block summary: A = proximal margin B = distal margin C-F = sections of ulceration and  underlying indurated tissue G-J = ulceration and underlying indurated tissue, with nearest serosa/peritoneal reflection. K = perirectal soft tissue margin nearest indurated tissue L = four possible nodes, whole M = four possible nodes, whole N = four possible nodes, whole O = four possible node, whole P = one node bisected Total = sixteen blocks. 2. Received in formalin is a 2 cm in diameter and 1 cm thick ring of tan to hyperemic smooth mucosa with underlying tissue with embedded suture material. Representative sections are submitted in one block. 3. Received in formalin is a 1.4 cm in diameter and up to 1 cm thick ring of tan to hyperemic smooth mucosa and underlying tissue with embedded metallic staples. Representative sections submitted in one block. 4. Received in formalin is a 5.1 x 4.4 x 3.4 cm ovary, clinically right, which has a tan pink to dark red exterior with scattered adhesions. There are also portions of attached tissue consistent with fimbria, however definite tube is not identified. On sectioning, there are several serous and mucoid filled cysts which are up to 4 cm in greatest dimension, and have  smooth inner surfaces with no areas of excrescences. Tissue adjacent to the largest cyst is focally tan white, firm and dense. Representative sections are submitted in five blocks. (SSW:kh 05-26-15) Report signed out from the following location(s) Technical component and interpretation was performed at [facility], [place], [place] [address]. CLIA #: [id number], 3 of 3  Note: Portions of this report may have been transcribed using voice recognition software. Every effort was made to ensure accuracy; however, inadvertent computerized transcription errors may be present.   Any transcriptional errors that result from this process are unintentional.     [name], M.D., F.A.C.S. Gastrointestinal and Minimally Invasive Surgery [facility] [address] [place], [place] [address] [phone] Main / Paging   06/26/2015  CARE TEAM:  PCP: Vidal Schwalbe, MD  Outpatient Care Team: Patient Care Team: [name], MD as PCP - General (Family Medicine) Michael Boston, MD as Consulting Physician (General Surgery) Truitt Merle, MD as Consulting Physician (Medical Oncology) Kyung Rudd, MD as Consulting Physician (Radiation Oncology) Teena Irani, MD as Consulting Physician (Gastroenterology)  Inpatient Treatment Team: Treatment Team: Attending Provider: Michael Boston, MD; Technician: Abbe Amsterdam, NT; Technician: Sueanne Margarita, NT

## 2015-06-27 MED ORDER — CIPROFLOXACIN HCL 500 MG PO TABS
500.0000 mg | ORAL_TABLET | Freq: Two times a day (BID) | ORAL | Status: DC
  Administered 2015-06-27 – 2015-06-29 (×5): 500 mg via ORAL
  Filled 2015-06-27 (×7): qty 1

## 2015-06-27 MED ORDER — ACETAMINOPHEN 325 MG PO TABS: 325.0000 mg | ORAL_TABLET | Freq: Four times a day (QID) | ORAL | Status: DC | PRN

## 2015-06-27 MED ORDER — POTASSIUM CHLORIDE CRYS ER 20 MEQ PO TBCR
40.0000 meq | EXTENDED_RELEASE_TABLET | Freq: Two times a day (BID) | ORAL | Status: DC
  Administered 2015-06-27 – 2015-06-29 (×5): 40 meq via ORAL
  Filled 2015-06-27 (×7): qty 2

## 2015-06-27 MED ORDER — ACETAMINOPHEN 500 MG PO TABS
1000.0000 mg | ORAL_TABLET | Freq: Three times a day (TID) | ORAL | Status: DC
  Administered 2015-06-27 – 2015-06-29 (×4): 1000 mg via ORAL
  Filled 2015-06-27 (×9): qty 2

## 2015-06-27 MED ORDER — HYDROCODONE-ACETAMINOPHEN 10-325 MG PO TABS: 1.0000 | ORAL_TABLET | Freq: Every day | ORAL | Status: DC

## 2015-06-27 MED ORDER — HYDROMORPHONE HCL 2 MG PO TABS
4.0000 mg | ORAL_TABLET | ORAL | Status: DC | PRN
  Administered 2015-06-27 – 2015-06-29 (×8): 4 mg via ORAL
  Filled 2015-06-27 (×8): qty 2

## 2015-06-27 MED ORDER — LOPERAMIDE HCL 2 MG PO CAPS
4.0000 mg | ORAL_CAPSULE | Freq: Every day | ORAL | Status: DC
  Administered 2015-06-27 – 2015-06-28 (×2): 4 mg via ORAL
  Filled 2015-06-27 (×2): qty 2

## 2015-06-27 MED ORDER — HYDROMORPHONE HCL 1 MG/ML IJ SOLN
0.5000 mg | INTRAMUSCULAR | Status: DC | PRN
  Administered 2015-06-28 (×2): 2 mg via INTRAVENOUS
  Filled 2015-06-27 (×2): qty 2

## 2015-06-27 NOTE — Progress Notes (Addendum)
[facility]  [address] [place], [place] [address] Phone: [phone] FAX: [phone]   Lindsay Little [id number] Aug 05, 1965   Assessment  Pelvic abscess w probable contained leak  Problem List:   Principal Problem:   Pelvic abscess in female Active Problems:   Morbid obesity with BMI of 50.0-59.9, adult ([facility])   Rectal adenocarcinoma s/p LAR resection 05/25/2015   Hypertension   Depression   Anemia of chronic disease   Protein-calorie malnutrition, moderate (HCC)   Plan:  -Pelvic fluid collection drained.  Do drain stuty in AM to see if drain needs to be upsized or new drain needs to be placed to get rest of the area.  Husband learning care -IV ABx - f/u Cx. Multiple organisms but no yeast.  Cont Cipro/flagyl w PCN allergy -PO w supp shakes.  Check nutrition labs -adv diet - HH as tolerated -diarrhea controlled with imodium & add PO iron -try PO dilaudid instead for pain control -loop ileostomy if worsens -anemia s/p iron - follow.  Transfuse if worse -anxiolysis -hypoK - replace -VTE prophylaxis- SCDs, etc -mobilize as tolerated to help recovery  [name], M.D., F.A.C.S. Gastrointestinal and Minimally Invasive Surgery [facility] [address], [place], [place] [address] [phone] Main / Paging   06/27/2015  Subjective:  Less sore right inner pelvis but needing ## pain meds.  Husband at bedside.  Gets some severe nausea but trying PO  Less loose BMs hard to control  Objective:  Vital signs:  Filed Vitals:   06/26/15 0521 06/26/15 1428 06/26/15 2200 06/27/15 0600  BP: 117/61 122/64 123/53 153/80  Pulse: 78 70 81 60  Temp: 98.3 F (36.8 C) 98.4 F (36.9 C) 97.7 F (36.5 C) 98 F (36.7 C)  TempSrc: Oral Oral Oral Oral  Resp: 18 18 18 16   Height:      Weight:      SpO2: 95% 98% 100% 99%    Last BM Date: 06/25/15  Intake/Output    Yesterday:  12/12 0701 - 12/13 0700 In: 9432 [P.O.:720; I.V.:2650] Out: 410 [Urine:400; Drains:10] This shift:     Bowel function:  Flatus: y  BM: yes.  losse but a little better formed  Drain: n/a  Physical Exam:  General: Pt awake/alert/oriented x4 in moderate acute distress. Pale. Not toxic. A little less tired Eyes: PERRL, normal EOM. Sclera nonicteric Neuro: CN II-XII intact w/o focal sensory/motor deficits. Lymph: No head/neck/groin lymphadenopathy Psych: No delerium/psychosis/paranoia. Chronically shaking her legs. Consolable HENT: Normocephalic, Mucus membranes moist. No thrush Neck: Supple, No tracheal deviation Chest: No pain. Good respiratory excursion. CV: Pulses intact. Regular rhythm Abdomen: Soft, Nondistended. Morbidly obese. Incisions well-healed. No cellulitis or infection at extraction incision. Mild discomfort left suprapubic region. No incarcerated hernias. Normal external genitalia. No vaginal bleeding or discharge. No hernias. Rectal:. Perianal skin clean.  Drain site clean Ext: SCDs BLE. No significant edema. No cyanosis Skin: No petechiae / purpurea. No major sores Musculoskeletal: No severe joint pain. Good ROM major joints  Results:   Labs: Results for orders placed or performed during the hospital encounter of 06/23/15 (from the past 48 hour(s))  Prealbumin     Status: Abnormal   Collection Time: 06/26/15 12:01 AM  Result Value Ref Range   Prealbumin 6.1 (L) 18 - 38 mg/dL    Comment: Performed at [facility]  CBC     Status: Abnormal   Collection Time: 06/26/15 12:01 AM  Result Value Ref Range   WBC 5.9 4.0 - 10.5 K/uL   RBC 2.56 (L) 3.87 - 5.11 MIL/uL   Hemoglobin 6.7 (LL) 12.0 - 15.0 g/dL    Comment: REPEATED TO VERIFY CRITICAL VALUE NOTED.  VALUE IS CONSISTENT WITH PREVIOUSLY REPORTED AND CALLED VALUE.    HCT 22.4 (L) 36.0 - 46.0 %   MCV 87.5 78.0 - 100.0 fL   MCH 26.2 26.0 - 34.0 pg   MCHC 29.9 (L)  30.0 - 36.0 g/dL   RDW 18.1 (H) 11.5 - 15.5 %   Platelets 389 150 - 400 K/uL  Basic metabolic panel     Status: Abnormal   Collection Time: 06/26/15 12:01 AM  Result Value Ref Range   Sodium 139 135 - 145 mmol/L    Comment: DELTA CHECK NOTED REPEATED TO VERIFY    Potassium 3.7 3.5 - 5.1 mmol/L   Chloride 103 101 - 111 mmol/L   CO2 26 22 - 32 mmol/L   Glucose, Bld 103 (H) 65 - 99 mg/dL   BUN 8 6 - 20 mg/dL   Creatinine, Ser 0.48 0.44 - 1.00 mg/dL   Calcium 9.0 8.9 - 10.3 mg/dL   GFR calc non Af Amer >60 >60 mL/min   GFR calc Af Amer >60 >60 mL/min    Comment: (NOTE) The eGFR has been calculated using the CKD EPI equation. This calculation has not been validated in all clinical situations. eGFR's persistently <60 mL/min signify possible Chronic Kidney Disease.    Anion gap 10 5 - 15  Magnesium     Status: None   Collection Time: 06/26/15 12:01 AM  Result Value Ref Range   Magnesium 1.9 1.7 - 2.4 mg/dL    Imaging / Studies: No results found.  Medications / Allergies: per chart  Antibiotics: Anti-infectives    Start     Dose/Rate Route Frequency Ordered Stop   06/27/15 0900  ciprofloxacin (CIPRO) tablet 500 mg     500 mg Oral 2 times daily 06/27/15 0735     06/23/15 1800  ciprofloxacin (CIPRO) IVPB 400 mg  Status:  Discontinued     400 mg 200 mL/hr over 60 Minutes Intravenous Every 12 hours 06/23/15 1647 06/27/15 0735   06/23/15 1800  metroNIDAZOLE (FLAGYL) IVPB 500 mg     500 mg 100 mL/hr over 60 Minutes Intravenous Every 6 hours 06/23/15 1647          POST-OPERATIVE DIAGNOSIS:   Proximal Rectal Cancer  SURGERY: 05/25/2015  XI ROBOTIC ASSISTED RECTOSIGMOID LOWER ANTERIOR RESECTION Laparoscopic and robotic lysis of adhesions X 60 MINUTES RIGID PROCTOSCOPY RIGHT SALPINGO-OOPHORECTOMY INSERTION PORT-A-CATH in R IJ  SURGEON:   Surgeon(s): Michael Boston, MD Leighton Ruff, MD  OR FINDINGS:   Patient had a bulky tumor in proximal and mid rectum. Most  of it focused on the right anterolateral circumference. Still came down to around 9 cm from the anal verge.  I enlarged right ovary was some firm and mainly cystic areas. At least 6 cm in largest dimension. Left side looked atrophic and normal. Right ovary removed.  PowerPort is a ClearVUE 8 [place]. Reference #[id number] Lot number [id number] It is in the right internal jugular vein. The tip sits at the right atrial/superior vena caval junction  No obvious metastatic disease on visceral parietal peritoneum or liver.  The anastomosis rests 5-6 cm from the anal verge by rigid proctoscopy  Diagnosis 1. Colon, segmental resection for tumor, recto-sigmoid INVASIVE COLONIC ADENOCARCINOMA (0.2 CM IN GREATEST DIMENSION RESIDUAL  TUMOR) THE TUMOR INVADE SUBMUCOSA (0.3 CM IN DEPTH, PT1) MARGINS OF RESECTION ARE NEGATIVE FOR TUMOR TWELVE BENIGN LYMPH NODES (0/12) 2. Colon, resection margin (donut), proximal anastomotic ring BENIGN COLONIC TISSUE, NEGATIVE FOR MALIGANCY 3. Colon, resection margin (donut), distal anastomotic ring BENIGN COLONIC TISSUE, NEGATIVE FOR MALIGNANCY 4. Ovary, right BENIGN SEROUS CYSTADENOMA Microscopic Comment 1. COLON AND RECTUM (INCLUDING TRANS-ANAL RESECTION): Specimen: Colon-rectum Procedure: Segmental resection Tumor site: Rectum anterior wall mid to distal third of rectum Specimen integrity: intact Macroscopic intactness of mesorectum: Complete: _ Macroscopic tumor perforation: Negative Invasive tumor: Maximum size: Histologic type(s): G2 Histologic grade and differentiation: G1: well differentiated/low grade G2: moderately differentiated/low grade G3: poorly differentiated/high grade G4: undifferentiated/high grade Type of polyp in which invasive carcinoma arose: _ 1 of 3 Duplicate copy FINAL for TZIPORAH, KNOKE N [phone]) Microscopic Comment(continued) Microscopic extension of invasive tumor:Submucosa Lymph-Vascular invasion: Not  identified Peri-neural invasion: negative Tumor deposit(s) (discontinuous extramural extension): Negative Resection margins: Proximal margin: Negative Distal margin: Negative Circumferential (radial) (posterior ascending, posterior descending; lateral and posterior mid-rectum; and entire lower 1/3 rectum):Negatvie Mesenteric margin (sigmoid and transverse): Negative Distance closest margin (if all above margins negative): 2.5 cm Treatment effect (neo-adjuvant therapy): Present Additional polyp(s): Negative Non-neoplastic findings: Unremarkable Lymph nodes: number examined 12; number positive: 0 Pathologic Staging: T1, N0, M_ Ancillary studies: Preserved expression of the makor and minor MMR proteins MLH1, MSH2, MSH6 and PMS2. MLH1: Casimer Lanius MD Pathologist, Electronic Signature (Case signed 05/31/2015) Specimen Lamar Meter and Clinical Information Specimen(s) Obtained: 1. Colon, segmental resection for tumor, recto-sigmoid 2. Colon, resection margin (donut), proximal anastomotic ring 3. Colon, resection margin (donut), distal anastomotic ring 4. Ovary, right Specimen Clinical Information 1. proximal rectal cancer [rd] Corrissa Martello 1. Specimen: Rectosigmoid, to include at least portion of distal 1/3 of rectum. Specimen integrity: Intact Specimen length: 38 cm. 11 cm from sigmoid rectal junction to distal margin, and 4 cm from peritoneal reflection to distal margin. Mesorectal intactness: Complete Tumor location: Anterior wall, middle third to distal third of rectum Tumor size: There is a 4 cm in length and 2.2 cm in diameter area of healing ulceration with underlying indurated tan white dense tissue up to 1.1 cm thick. A discrete mass is not identified. Percent of bowel circumference involved: 40% Tumor distance to margins: Proximal: 28 cm Distal: 6 cm Radial (posterior ascending, posterior descending; lateral and posterior mid-rectum; and entire lower 1/3 rectum): The area of dense  indurated tissue is 2.5 cm from the perirectal soft tissue margin. 2 of 3 Duplicate copy FINAL for JALASIA, ESKRIDGE [phone]) Shawndrea Rutkowski(continued) Macroscopic extent of tumor invasion: No definite mass to evaluate invasion Total presumed lymph nodes: Found are seventeen possible lymph nodes ranging from 0.4 to 1.3 cm Extramural satellite tumor nodules: None Mucosal polyp(s): None Additional findings: None Block summary: A = proximal margin B = distal margin C-F = sections of ulceration and underlying indurated tissue G-J = ulceration and underlying indurated tissue, with nearest serosa/peritoneal reflection. K = perirectal soft tissue margin nearest indurated tissue L = four possible nodes, whole M = four possible nodes, whole N = four possible nodes, whole O = four possible node, whole P = one node bisected Total = sixteen blocks. 2. Received in formalin is a 2 cm in diameter and 1 cm thick ring of tan to hyperemic smooth mucosa with underlying tissue with embedded suture material. Representative sections are submitted in one block. 3. Received in formalin is a 1.4 cm in diameter and up to 1 cm thick ring  of tan to hyperemic smooth mucosa and underlying tissue with embedded metallic staples. Representative sections submitted in one block. 4. Received in formalin is a 5.1 x 4.4 x 3.4 cm ovary, clinically right, which has a tan pink to dark red exterior with scattered adhesions. There are also portions of attached tissue consistent with fimbria, however definite tube is not identified. On sectioning, there are several serous and mucoid filled cysts which are up to 4 cm in greatest dimension, and have smooth inner surfaces with no areas of excrescences. Tissue adjacent to the largest cyst is focally tan white, firm and dense. Representative sections are submitted in five blocks. (SSW:kh 05-26-15) Report signed out from the following location(s) Technical component and interpretation  was performed at [facility], [place], [place] [address]. CLIA #: [id number], 3 of 3  Note: Portions of this report may have been transcribed using voice recognition software. Every effort was made to ensure accuracy; however, inadvertent computerized transcription errors may be present.   Any transcriptional errors that result from this process are unintentional.     [name], M.D., F.A.C.S. Gastrointestinal and Minimally Invasive Surgery [facility] [address] [place], [place] [address] [phone] Main / Paging   06/27/2015  CARE TEAM:  PCP: Vidal Schwalbe, MD  Outpatient Care Team: Patient Care Team: [name], MD as PCP - General (Family Medicine) Michael Boston, MD as Consulting Physician (General Surgery) Truitt Merle, MD as Consulting Physician (Medical Oncology) Kyung Rudd, MD as Consulting Physician (Radiation Oncology) Teena Irani, MD as Consulting Physician (Gastroenterology)  Inpatient Treatment Team: Treatment Team: Attending Provider: Michael Boston, MD; Technician: Abbe Amsterdam, NT; Technician: Sueanne Margarita, NT; Technician: Leda Quail, NT; Registered Nurse: Josepha Pigg, RN

## 2015-06-27 NOTE — Progress Notes (Signed)
[name], female   DOB: [birth year], 49 y.o.   MRN: [id number]          Subjective:  Pt still with occ nausea, some soreness at TG drain site; few loose stools  Allergies: Caine-1; Oxycodone; Penicillins; and Sulfa antibiotics  Medications: Prior to Admission medications   Medication Sig Start Date End Date Taking? Authorizing Provider  ciprofloxacin (CIPRO) 500 MG tablet Take 500 mg by mouth 2 (two) times daily. For 5 days 06/21/2015-06/25/2015 06/21/15  Yes Historical Provider, MD  clotrimazole (MYCELEX) 10 MG troche Take 10 mg by mouth 3 (three) times daily.  06/13/15  Yes Historical Provider, MD  Coenzyme Q10 (COQ-10) 100 MG CAPS Take 100 mg by mouth daily.   Yes Historical Provider, MD  DULoxetine (CYMBALTA) 60 MG capsule Take 60 mg by mouth at bedtime.    Yes Historical Provider, MD  esomeprazole (NEXIUM) 40 MG capsule Take 40 mg by mouth 2 (two) times daily.   Yes Historical Provider, MD  furosemide (LASIX) 20 MG tablet Take 20 mg by mouth as needed for fluid or edema.    Yes Historical Provider, MD  HYDROcodone-acetaminophen (NORCO) 10-325 MG tablet Take 1-2 tablets by mouth every 6 (six) hours as needed for moderate pain or severe pain. Patient taking differently: Take 1-2 tablets by mouth every 4 (four) hours as needed for moderate pain or severe pain.  05/25/15  Yes Michael Boston, MD  hydrocortisone 2.5 % cream Apply 1 application topically as needed (rash).    Yes Historical Provider, MD  hyoscyamine (LEVBID) 0.375 MG 12 hr tablet Take 0.375 mg by mouth 2 (two) times daily.   Yes Historical Provider, MD  meloxicam (MOBIC) 15 MG tablet Take 15 mg by mouth daily.   Yes Historical Provider, MD  methocarbamol (ROBAXIN) 500 MG tablet Take 500 mg by mouth 4 (four) times daily as needed. 06/06/15  Yes Historical Provider, MD  nebivolol (BYSTOLIC) 10 MG tablet Take 10 mg by mouth at bedtime.    Yes Historical Provider, MD  promethazine (PHENERGAN) 25 MG tablet  Take 25 mg by mouth every 6 (six) hours as needed for nausea or vomiting.   Yes Historical Provider, MD  rosuvastatin (CRESTOR) 5 MG tablet Take 5 mg by mouth at bedtime.    Yes Historical Provider, MD  traMADol (ULTRAM) 50 MG tablet Take 50 mg by mouth every 4 (four) hours as needed. pain 06/13/15  Yes Historical Provider, MD  Vitamin D, Ergocalciferol, (DRISDOL) 50000 UNITS CAPS capsule Take 50,000 Units by mouth 2 (two) times a week.   Yes Historical Provider, MD  lidocaine-prilocaine (EMLA) cream Apply to affected area once Patient not taking: Reported on 06/23/2015 06/14/15   Truitt Merle, MD  ondansetron (ZOFRAN) 8 MG tablet Take 1 tablet (8 mg total) by mouth 2 (two) times daily. Start the day after chemo for 2 days. Then take as needed for nausea or vomiting. Patient not taking: Reported on 06/23/2015 06/14/15   Truitt Merle, MD     Vital Signs: BP 153/80 mmHg  Pulse 60  Temp(Src) 98 F (36.7 C) (Oral)  Resp 16  Ht 5' 10.5" (1.791 m)  Wt 323 lb (146.512 kg)  BMI 45.68 kg/m2  SpO2 99%  Physical ExamTG drain intact, insertion site ok, mildly tender, output 10 cc feculent material, cxs'-pend; drain irrigated with sterile NS without difficulty  Imaging: Dg Pelvis 1-2 Views  06/24/2015  CLINICAL DATA:  Pain EXAM: PELVIS - 1-2 VIEW COMPARISON:  None. FINDINGS:  There is no evidence of pelvic fracture or dislocation. Joint spaces appear intact. Visualized bowel gas pattern is normal. There are phleboliths in the pelvis. IMPRESSION: No bony abnormality.  Visualized bowel gas pattern unremarkable. Electronically Signed   By: Lowella Grip III M.D.   On: 06/24/2015 09:42   Ct Abdomen Pelvis W Contrast  06/23/2015  CLINICAL DATA:  Rectosigmoid colon resection for rectal cancer on 05/25/2015. Rectal pain and bleeding for 2 weeks. Assess for possible abscess. EXAM: CT ABDOMEN AND PELVIS WITH CONTRAST TECHNIQUE: Multidetector CT imaging of the abdomen and pelvis was performed using the standard  protocol following bolus administration of intravenous contrast. CONTRAST:  126m ISOVUE-300 IOPAMIDOL (ISOVUE-300) INJECTION 61% COMPARISON:  MR abdomen 02/05/2015.  CT abdomen 01/20/2015 FINDINGS: Lung bases are clear. No pleural or pericardial fluid. Known hemangiomas are again demonstrated in the liver, unchanged from the previous studies. No evidence of hepatic metastatic disease. No calcified gallstones. The spleen is normal. The pancreas is normal. The adrenal glands are normal. The kidneys are normal. The aorta and IVC are normal. No retroperitoneal mass or lymphadenopathy in the region of the abdomen. No acute bowel pathology in the region of the abdomen. In the pelvis, the patient has had previous hysterectomy. There has been recent rectosigmoid surgery with loss of the fat plane between the rectosigmoid colon in the sacrum/coccyx. There is stool material within the rectum. It appears that there is a direct reanastomosis. There is an unusual air cavity in that region raising the possibility of dehiscence at the anastomosis with some contained air/gas. I cannot be completely sure that there is not an extraluminal abscess, and am suspicious of that. It may be necessary to perform a water soluble contrast study to completely understand this anatomy. IMPRESSION: Postoperative changes at the rectosigmoid region. Worrisome gas collection with unusual characteristics that suggests the presence of dehiscence of the anastomosis with a contained rupture. Possible abscess in that region, as it is difficult to define the exact margins of the lumen of the rectosigmoid. I am in the process of calling these results. Electronically Signed   By: MNelson ChimesM.D.   On: 06/23/2015 14:53   Dg Abd Portable 2v  06/24/2015  CLINICAL DATA:  Abdominal pain and diarrhea.  Colon can't EXAM: PORTABLE ABDOMEN - 2 VIEW COMPARISON:  CT 06/23/2015 FINDINGS: No dilated loops of large or small bowel. There is gas and stool throughout  the colon and rectosigmoid colon. Surgical clips noted in the lower pelvis. No intraperitoneal free air. IMPRESSION: No evidence of bowel obstruction or intraperitoneal free air. Electronically Signed   By: SSuzy BouchardM.D.   On: 06/24/2015 09:20   Ct Image Guide Drain Transvag Transrect Peritoneal Retroper  06/26/2015  ADDENDUM REPORT: 06/26/2015 15:45 ADDENDUM: The local anesthetic utilized in the above procedure was bupivacaine given patient's history of lidocaine allergy. The patient tolerated this local anesthetic without adverse complication. Electronically Signed   By: JSandi MariscalM.D.   On: 06/26/2015 15:45  06/26/2015  INDICATION: History of colon cancer, post robotic assisted low anterior resection, now with pelvic pain and fever with CT findings worrisome for perianastomotic leak. Please perform CT-guided percutaneous drainage catheter placement. EXAM: CT IMAGE GUIDE DRAIN TRANSVAG TRANSRECT PERITONEAL RETROPER COMPARISON:  CT abdomen pelvis - 06/23/2015; 01/20/2015 MEDICATIONS: The patient is currently admitted to the hospital and receiving intravenous antibiotics. The antibiotics were administered within an appropriate time frame prior to the initiation of the procedure. ANESTHESIA/SEDATION: Fentanyl 100 mcg IV; Versed 6 mg  IV Total Moderate Sedation time 19 minutes CONTRAST:  None COMPLICATIONS: None immediate PROCEDURE: Informed written consent was obtained from the patient after a discussion of the risks, benefits and alternatives to treatment. The patient was placed prone on the CT gantry and a pre procedural CT was performed re-demonstrating the known abscess/fluid collection within the presacral space with dominant component measuring approximately 7.2 x 4.5 cm (image 19, series 3). The procedure was planned. A timeout was performed prior to the initiation of the procedure. The skin overlying the medial aspect the right buttocks was prepped and draped in the usual sterile fashion. The  overlying soft tissues were anesthetized with 1% lidocaine with epinephrine. Appropriate trajectory was planned with the use of a 22 gauge spinal needle. An 18 gauge trocar needle was advanced into the abscess/fluid collection and a short Amplatz super stiff wire was coiled within the collection. Appropriate positioning was confirmed with a limited CT scan. The tract was serially dilated allowing placement of a 10 [place] all-purpose drainage catheter. Appropriate positioning was confirmed with a limited postprocedural CT scan. Approximately 20 cc of feculent material was aspirated. Given the feculent appearance of the aspirated fluid, the drainage catheter was connected to a JP bulb. The drainage catheter was sutured in place. A dressing was placed. The patient tolerated the procedure well without immediate post procedural complication. IMPRESSION: Successful CT guided placement of a 10 [place] all purpose drain catheter into the pelvis via right trans gluteal approach with aspiration of 20 mL of purulent fluid. Samples were sent to the laboratory as requested by the ordering clinical team. Electronically Signed: By: Sandi Mariscal M.D. On: 06/24/2015 13:06    Labs:  CBC:  Recent Labs  06/14/15 0829 06/23/15 2000 06/24/15 0630 06/26/15 0001  WBC 9.9 8.2 6.3 5.9  HGB 8.3* 7.4* 6.5* 6.7*  HCT 26.6* 24.7* 21.5* 22.4*  PLT 537* 421* 362 389    COAGS:  Recent Labs  06/24/15 0630  INR 1.22    BMP:  Recent Labs  05/18/15 1450 05/26/15 0552 06/14/15 0829 06/23/15 2000 06/24/15 0630 06/26/15 0001  NA 140 137 141  --  132* 139  K 4.0 4.3 4.3  --  3.2* 3.7  CL 107 106  --   --  99* 103  CO2 27 27 27   --  25 26  GLUCOSE 144* 115* 99  --  94 103*  BUN 14 10 11.1  --  8 8  CALCIUM 9.7 9.1 10.2  --  8.8* 9.0  CREATININE 0.68 0.60 0.7 0.62 0.59 0.48  GFRNONAA >60 >60  --  >60 >60 >60  GFRAA >60 >60  --  >60 >60 >60    LIVER FUNCTION TESTS:  Recent Labs  03/21/15 0929 03/27/15 0956  04/18/15 0900 06/14/15 0829  BILITOT 0.41 0.31 0.33 <0.30  AST 11 14 10 10   ALT 16 16 13 12   ALKPHOS 62 64 72 78  PROT 6.8 6.6 7.0 7.5  ALBUMIN 3.7 3.6 3.7 2.7*    Assessment and Plan: S/p LAR 05/25/15 for mid rectal cancer; s/p drainage of right pelvic abscess 12/10;afebrile; no new labs today; abscess cx's pend; drain output about 25 cc yesterday; request noted for drain injection in am with possible drain upsizing if necessary   Signed: D. Rowe Robert 06/27/2015, 10:10 AM   I spent a total of 15 minutes at the the patient's bedside AND on the patient's hospital floor or unit, greater than 50% of which was counseling/coordinating care for  pelvic abscess drain

## 2015-06-28 ENCOUNTER — Encounter (HOSPITAL_COMMUNITY): Payer: Self-pay | Admitting: Surgery

## 2015-06-28 ENCOUNTER — Inpatient Hospital Stay (HOSPITAL_COMMUNITY): Payer: BLUE CROSS/BLUE SHIELD

## 2015-06-28 ENCOUNTER — Other Ambulatory Visit: Payer: BLUE CROSS/BLUE SHIELD

## 2015-06-28 DIAGNOSIS — D270 Benign neoplasm of right ovary: Secondary | ICD-10-CM

## 2015-06-28 LAB — CBC WITH DIFFERENTIAL/PLATELET
Basophils Absolute: 0 10*3/uL (ref 0.0–0.1)
Basophils Relative: 0 %
EOS ABS: 0.3 10*3/uL (ref 0.0–0.7)
Eosinophils Relative: 5 %
HCT: 22.5 % — ABNORMAL LOW (ref 36.0–46.0)
HEMOGLOBIN: 6.7 g/dL — AB (ref 12.0–15.0)
LYMPHS ABS: 0.5 10*3/uL — AB (ref 0.7–4.0)
Lymphocytes Relative: 8 %
MCH: 25.8 pg — AB (ref 26.0–34.0)
MCHC: 29.8 g/dL — AB (ref 30.0–36.0)
MCV: 86.5 fL (ref 78.0–100.0)
MONO ABS: 0.6 10*3/uL (ref 0.1–1.0)
MONOS PCT: 10 %
NEUTROS PCT: 77 %
Neutro Abs: 4.8 10*3/uL (ref 1.7–7.7)
Platelets: 396 10*3/uL (ref 150–400)
RBC: 2.6 MIL/uL — ABNORMAL LOW (ref 3.87–5.11)
RDW: 18.5 % — ABNORMAL HIGH (ref 11.5–15.5)
WBC: 6.3 10*3/uL (ref 4.0–10.5)

## 2015-06-28 MED ORDER — BUPIVACAINE HCL (PF) 0.25 % IJ SOLN
INTRAMUSCULAR | Status: AC
  Filled 2015-06-28: qty 30

## 2015-06-28 MED ORDER — HYDROMORPHONE HCL 4 MG PO TABS: 4.0000 mg | ORAL_TABLET | ORAL | Status: DC | PRN

## 2015-06-28 MED ORDER — METRONIDAZOLE 500 MG PO TABS: 500.0000 mg | ORAL_TABLET | Freq: Three times a day (TID) | ORAL | Status: DC

## 2015-06-28 MED ORDER — MIDAZOLAM HCL 2 MG/2ML IJ SOLN
INTRAMUSCULAR | Status: AC
  Filled 2015-06-28: qty 2

## 2015-06-28 MED ORDER — FENTANYL CITRATE (PF) 100 MCG/2ML IJ SOLN
INTRAMUSCULAR | Status: AC
  Filled 2015-06-28: qty 4

## 2015-06-28 MED ORDER — FENTANYL CITRATE (PF) 100 MCG/2ML IJ SOLN: 25.0000 ug | INTRAMUSCULAR | Status: DC | PRN

## 2015-06-28 MED ORDER — IOHEXOL 300 MG/ML  SOLN
10.0000 mL | Freq: Once | INTRAMUSCULAR | Status: AC | PRN
  Administered 2015-06-28: 10 mL

## 2015-06-28 MED ORDER — MIDAZOLAM HCL 2 MG/2ML IJ SOLN
INTRAMUSCULAR | Status: AC | PRN
  Administered 2015-06-28 (×6): 1 mg via INTRAVENOUS

## 2015-06-28 MED ORDER — MIDAZOLAM HCL 2 MG/2ML IJ SOLN
INTRAMUSCULAR | Status: AC
  Filled 2015-06-28: qty 6

## 2015-06-28 MED ORDER — FENTANYL CITRATE (PF) 100 MCG/2ML IJ SOLN
INTRAMUSCULAR | Status: AC | PRN
  Administered 2015-06-28 (×2): 50 ug via INTRAVENOUS

## 2015-06-28 MED ORDER — CIPROFLOXACIN HCL 500 MG PO TABS: 500.0000 mg | ORAL_TABLET | Freq: Two times a day (BID) | ORAL | Status: DC

## 2015-06-28 NOTE — Progress Notes (Signed)
MD on call notified of patient's condition. On call MD placed discharge order on hold. Patient will stay until tomorrow. Discharge order discontinued.

## 2015-06-28 NOTE — Procedures (Signed)
Interventional Radiology Procedure Note  Procedure: Reposition and upsize of right transgluteal drain.  The drain was advanced more medially into the pre-sacral space and up-sized to 53F.  Of note, there is communication between the abscess and the rectum.  Complications: None   Estimated Blood Loss: 0  Recommendations: - Flush TID  Signed,  Criselda Peaches, MD

## 2015-06-28 NOTE — Progress Notes (Signed)
[facility]  [address] [place], [place] [address] Phone: [phone] FAX: [phone]   Lindsay Little [id number] 1965-08-27   Assessment  Pelvic abscess w probable contained leak - recovering slowly  Problem List:   Principal Problem:   Pelvic abscess in female Active Problems:   Morbid obesity with BMI of 50.0-59.9, adult ([place])   Rectal adenocarcinoma s/p LAR resection 05/25/2015   Hypertension   Depression   Anemia of chronic disease   Protein-calorie malnutrition, moderate (HCC)   Plan:  -Pelvic fluid collection drained.  Do drain stuty in AM to see if drain needs to be upsized or new drain needs to be placed to get rest of the area.  Husband learning care  -IV ABx - f/u Cx. Multiple organisms but no yeast.  Cont Cipro/flagyl w PCN allergy  -PO w supp shakes.  Check nutrition labs  -solid diet as tolerated since low output fistula  -diarrhea controlled with imodium.  -stop PO iron with h/o nausea.   -PO dilaudid instead for pain control - seems to be working better  -loop ileostomy if worsens  -anemia s/p iron - follow.  Transfuse if worse.  May need IV iron again  -anxiolysis  -hypoK - replace  -VTE prophylaxis- SCDs, etc  -mobilize as tolerated to help recovery  [name], M.D., F.A.C.S. Gastrointestinal and Minimally Invasive Surgery [facility] [address] [place], [place] [address] [phone] Main / Paging   06/28/2015  Subjective:  Less sore inner pelvis but needing pain meds.  Husband at bedside.  Gets intermittent nausea but trying PO  BMs better formed  Objective:  Vital signs:  Filed Vitals:   06/27/15 0600 06/27/15 1400 06/27/15 2202 06/28/15 0455  BP: 153/80 120/62 143/61 120/65  Pulse: 60 82 87 92  Temp: 98 F (36.7 C) 98.7 F (37.1 C) 98.6 F (37 C) 98.8 F (37.1 C)  TempSrc: Oral Oral Oral Oral  Resp: 16 18 18 16    Height:      Weight:      SpO2: 99% 100% 98% 98%    Last BM Date: 06/23/15  Intake/Output   Yesterday:  12/13 0701 - 12/14 0700 In: 2150 [P.O.:600; I.V.:750; IV Piggyback:800] Out: 888 [Urine:875; Drains:10; Stool:3] This shift:     Bowel function:  Flatus: y  BM: yes.  losse but a little better formed  Drain: n/a  Physical Exam:  General: Pt awake/alert/oriented x4 in moderate acute distress. Pale. Not toxic. Mildly tired but stable Eyes: PERRL, normal EOM. Sclera nonicteric Neuro: CN II-XII intact w/o focal sensory/motor deficits. Lymph: No head/neck/groin lymphadenopathy Psych: No delerium/psychosis/paranoia. Chronically shaking her legs. Consolable HENT: Normocephalic, Mucus membranes moist. No thrush Neck: Supple, No tracheal deviation Chest: No pain. Good respiratory excursion. CV: Pulses intact. Regular rhythm Abdomen: Soft, Nondistended. Morbidly obese. Incisions well-healed. No cellulitis or infection at extraction incision. Mild discomfort left suprapubic region. No incarcerated hernias. Normal external genitalia. No vaginal bleeding or discharge. No hernias. Rectal:. Perianal skin clean.  Drain site clean Ext: SCDs BLE. No significant edema. No cyanosis Skin: No petechiae / purpurea. No major sores Musculoskeletal: No severe joint pain. Good ROM major joints  Results:   Labs: No results found for this or any previous visit (from the past 48 hour(s)).  Imaging / Studies: No results found.  Medications / Allergies: per chart  Antibiotics: Anti-infectives    Start     Dose/Rate Route Frequency Ordered Stop  06/27/15 0900  ciprofloxacin (CIPRO) tablet 500 mg     500 mg Oral 2 times daily 06/27/15 0735     06/23/15 1800  ciprofloxacin (CIPRO) IVPB 400 mg  Status:  Discontinued     400 mg 200 mL/hr over 60 Minutes Intravenous Every 12 hours 06/23/15 1647 06/27/15 0735   06/23/15 1800  metroNIDAZOLE (FLAGYL) IVPB 500 mg     500  mg 100 mL/hr over 60 Minutes Intravenous Every 6 hours 06/23/15 1647          POST-OPERATIVE DIAGNOSIS:   Proximal Rectal Cancer  SURGERY: 05/25/2015  XI ROBOTIC ASSISTED RECTOSIGMOID LOWER ANTERIOR RESECTION Laparoscopic and robotic lysis of adhesions X 60 MINUTES RIGID PROCTOSCOPY RIGHT SALPINGO-OOPHORECTOMY INSERTION PORT-A-CATH in R IJ  SURGEON:   Surgeon(s): Michael Boston, MD Leighton Ruff, MD  OR FINDINGS:   Patient had a bulky tumor in proximal and mid rectum. Most of it focused on the right anterolateral circumference. Still came down to around 9 cm from the anal verge.  She had an enlarged right ovary was some firm and mainly cystic areas. At least 6 cm in largest dimension. Left side looked atrophic and normal. Right ovary removed.  PowerPort is a ClearVUE 8 [place]. Reference #[id number] Lot number [id number] It is in the right internal jugular vein. The tip sits at the right atrial/superior vena caval junction  No obvious metastatic disease on visceral parietal peritoneum or liver.  The anastomosis rests 5-6 cm from the anal verge by rigid proctoscopy  Diagnosis 1. Colon, segmental resection for tumor, recto-sigmoid INVASIVE COLONIC ADENOCARCINOMA (0.2 CM IN GREATEST DIMENSION RESIDUAL TUMOR) THE TUMOR INVADE SUBMUCOSA (0.3 CM IN DEPTH, PT1) MARGINS OF RESECTION ARE NEGATIVE FOR TUMOR TWELVE BENIGN LYMPH NODES (0/12) 2. Colon, resection margin (donut), proximal anastomotic ring BENIGN COLONIC TISSUE, NEGATIVE FOR MALIGANCY 3. Colon, resection margin (donut), distal anastomotic ring BENIGN COLONIC TISSUE, NEGATIVE FOR MALIGNANCY 4. Ovary, right BENIGN SEROUS CYSTADENOMA Microscopic Comment 1. COLON AND RECTUM (INCLUDING TRANS-ANAL RESECTION): Specimen: Colon-rectum Procedure: Segmental resection Tumor site: Rectum anterior wall mid to distal third of rectum Specimen integrity: intact Macroscopic intactness of mesorectum: Complete: _ Macroscopic  tumor perforation: Negative Invasive tumor: Maximum size: Histologic type(s): G2 Histologic grade and differentiation: G1: well differentiated/low grade G2: moderately differentiated/low grade G3: poorly differentiated/high grade G4: undifferentiated/high grade Type of polyp in which invasive carcinoma arose: _ 1 of 3 Duplicate copy FINAL for Lindsay Little, Lindsay Little N [phone]) Microscopic Comment(continued) Microscopic extension of invasive tumor:Submucosa Lymph-Vascular invasion: Not identified Peri-neural invasion: negative Tumor deposit(s) (discontinuous extramural extension): Negative Resection margins: Proximal margin: Negative Distal margin: Negative Circumferential (radial) (posterior ascending, posterior descending; lateral and posterior mid-rectum; and entire lower 1/3 rectum):Negatvie Mesenteric margin (sigmoid and transverse): Negative Distance closest margin (if all above margins negative): 2.5 cm Treatment effect (neo-adjuvant therapy): Present Additional polyp(s): Negative Non-neoplastic findings: Unremarkable Lymph nodes: number examined 12; number positive: 0 Pathologic Staging: T1, N0, M_ Ancillary studies: Preserved expression of the makor and minor MMR proteins MLH1, MSH2, MSH6 and PMS2. MLH1: Casimer Lanius MD Pathologist, Electronic Signature (Case signed 05/31/2015) Specimen Lindsay Little and Clinical Information Specimen(s) Obtained: 1. Colon, segmental resection for tumor, recto-sigmoid 2. Colon, resection margin (donut), proximal anastomotic ring 3. Colon, resection margin (donut), distal anastomotic ring 4. Ovary, right Specimen Clinical Information 1. proximal rectal cancer [rd] Lindsay Little 1. Specimen: Rectosigmoid, to include at least portion of distal 1/3 of rectum. Specimen integrity: Intact Specimen length: 38 cm. 11 cm from sigmoid rectal junction to distal margin, and 4 cm  from peritoneal reflection to distal margin. Mesorectal intactness: Complete Tumor  location: Anterior wall, middle third to distal third of rectum Tumor size: There is a 4 cm in length and 2.2 cm in diameter area of healing ulceration with underlying indurated tan white dense tissue up to 1.1 cm thick. A discrete mass is not identified. Percent of bowel circumference involved: 40% Tumor distance to margins: Proximal: 28 cm Distal: 6 cm Radial (posterior ascending, posterior descending; lateral and posterior mid-rectum; and entire lower 1/3 rectum): The area of dense indurated tissue is 2.5 cm from the perirectal soft tissue margin. 2 of 3 Duplicate copy FINAL for Lindsay Little, Lindsay Little [phone]) Marcelis Wissner(continued) Macroscopic extent of tumor invasion: No definite mass to evaluate invasion Total presumed lymph nodes: Found are seventeen possible lymph nodes ranging from 0.4 to 1.3 cm Extramural satellite tumor nodules: None Mucosal polyp(s): None Additional findings: None Block summary: A = proximal margin B = distal margin C-F = sections of ulceration and underlying indurated tissue G-J = ulceration and underlying indurated tissue, with nearest serosa/peritoneal reflection. K = perirectal soft tissue margin nearest indurated tissue L = four possible nodes, whole M = four possible nodes, whole N = four possible nodes, whole O = four possible node, whole P = one node bisected Total = sixteen blocks. 2. Received in formalin is a 2 cm in diameter and 1 cm thick ring of tan to hyperemic smooth mucosa with underlying tissue with embedded suture material. Representative sections are submitted in one block. 3. Received in formalin is a 1.4 cm in diameter and up to 1 cm thick ring of tan to hyperemic smooth mucosa and underlying tissue with embedded metallic staples. Representative sections submitted in one block. 4. Received in formalin is a 5.1 x 4.4 x 3.4 cm ovary, clinically right, which has a tan pink to dark red exterior with scattered adhesions. There are also  portions of attached tissue consistent with fimbria, however definite tube is not identified. On sectioning, there are several serous and mucoid filled cysts which are up to 4 cm in greatest dimension, and have smooth inner surfaces with no areas of excrescences. Tissue adjacent to the largest cyst is focally tan white, firm and dense. Representative sections are submitted in five blocks. (SSW:kh 05-26-15) Report signed out from the following location(s) Technical component and interpretation was performed at [place], [place], [place] [address]. CLIA #: [id number], 3 of 3  Note: Portions of this report may have been transcribed using voice recognition software. Every effort was made to ensure accuracy; however, inadvertent computerized transcription errors may be present.   Any transcriptional errors that result from this process are unintentional.     [name], M.D., F.A.C.S. Gastrointestinal and Minimally Invasive Surgery [facility] [address] [place],  [address] [phone] Main / Paging   06/28/2015  CARE TEAM:  PCP: Vidal Schwalbe, MD  Outpatient Care Team: Patient Care Team: [name], MD as PCP - General (Family Medicine) Michael Boston, MD as Consulting Physician (General Surgery) Truitt Merle, MD as Consulting Physician (Medical Oncology) Kyung Rudd, MD as Consulting Physician (Radiation Oncology) Teena Irani, MD as Consulting Physician (Gastroenterology)  Inpatient Treatment Team: Treatment Team: Attending Provider: Michael Boston, MD; Technician: Abbe [place], NT; Technician: Sueanne Margarita, NT; Technician: Leda Quail, NT; Registered Nurse: Charlena Cross, RN

## 2015-06-28 NOTE — Progress Notes (Signed)
Pt states that she does not want to be discharged this evening. She is very tired. So far she has had no c/o nausea or vomiting. She states that her pain is 8/10

## 2015-06-28 NOTE — Progress Notes (Signed)
[name], female   DOB: 03/25/1966, 49 y.o.   MRN: [id number]           Subjective: Pt appears tired; c/o nausea; still has some pain at TG drain site; afebrile   Allergies: Caine-1; Oxycodone; Penicillins; and Sulfa antibiotics  Medications: Prior to Admission medications   Medication Sig Start Date End Date Taking? Authorizing Provider  ciprofloxacin (CIPRO) 500 MG tablet Take 500 mg by mouth 2 (two) times daily. For 5 days 06/21/2015-06/25/2015 06/21/15  Yes Historical Provider, MD  clotrimazole (MYCELEX) 10 MG troche Take 10 mg by mouth 3 (three) times daily.  06/13/15  Yes Historical Provider, MD  Coenzyme Q10 (COQ-10) 100 MG CAPS Take 100 mg by mouth daily.   Yes Historical Provider, MD  DULoxetine (CYMBALTA) 60 MG capsule Take 60 mg by mouth at bedtime.    Yes Historical Provider, MD  esomeprazole (NEXIUM) 40 MG capsule Take 40 mg by mouth 2 (two) times daily.   Yes Historical Provider, MD  furosemide (LASIX) 20 MG tablet Take 20 mg by mouth as needed for fluid or edema.    Yes Historical Provider, MD  HYDROcodone-acetaminophen (NORCO) 10-325 MG tablet Take 1-2 tablets by mouth every 6 (six) hours as needed for moderate pain or severe pain. Patient taking differently: Take 1-2 tablets by mouth every 4 (four) hours as needed for moderate pain or severe pain.  05/25/15  Yes Michael Boston, MD  hydrocortisone 2.5 % cream Apply 1 application topically as needed (rash).    Yes Historical Provider, MD  hyoscyamine (LEVBID) 0.375 MG 12 hr tablet Take 0.375 mg by mouth 2 (two) times daily.   Yes Historical Provider, MD  meloxicam (MOBIC) 15 MG tablet Take 15 mg by mouth daily.   Yes Historical Provider, MD  methocarbamol (ROBAXIN) 500 MG tablet Take 500 mg by mouth 4 (four) times daily as needed. 06/06/15  Yes Historical Provider, MD  nebivolol (BYSTOLIC) 10 MG tablet Take 10 mg by mouth at bedtime.    Yes Historical Provider, MD  promethazine (PHENERGAN) 25 MG tablet  Take 25 mg by mouth every 6 (six) hours as needed for nausea or vomiting.   Yes Historical Provider, MD  rosuvastatin (CRESTOR) 5 MG tablet Take 5 mg by mouth at bedtime.    Yes Historical Provider, MD  traMADol (ULTRAM) 50 MG tablet Take 50 mg by mouth every 4 (four) hours as needed. pain 06/13/15  Yes Historical Provider, MD  Vitamin D, Ergocalciferol, (DRISDOL) 50000 UNITS CAPS capsule Take 50,000 Units by mouth 2 (two) times a week.   Yes Historical Provider, MD  ciprofloxacin (CIPRO) 500 MG tablet Take 1 tablet (500 mg total) by mouth 2 (two) times daily. 06/28/15   Michael Boston, MD  HYDROmorphone (DILAUDID) 4 MG tablet Take 1-2 tablets (4-8 mg total) by mouth every 4 (four) hours as needed for moderate pain or severe pain. 06/28/15   Michael Boston, MD  lidocaine-prilocaine (EMLA) cream Apply to affected area once Patient not taking: Reported on 06/23/2015 06/14/15   Truitt Merle, MD  metroNIDAZOLE (FLAGYL) 500 MG tablet Take 1 tablet (500 mg total) by mouth 3 (three) times daily. 06/28/15   Michael Boston, MD  ondansetron (ZOFRAN) 8 MG tablet Take 1 tablet (8 mg total) by mouth 2 (two) times daily. Start the day after chemo for 2 days. Then take as needed for nausea or vomiting. Patient not taking: Reported on 06/23/2015 06/14/15   Truitt Merle, MD     Vital  Signs: BP 120/65 mmHg  Pulse 92  Temp(Src) 98.8 F (37.1 C) (Oral)  Resp 16  Ht 5' 10.5" (1.791 m)  Wt 323 lb (146.512 kg)  BMI 45.68 kg/m2  SpO2 98%  Physical Exampt awake but sl drowsy; chest- CTA bilat ant; heart- RRR; abd- obese, soft,+BS, rt TG drain intact, output minimal amt feculent material; cx's pend  Imaging: Ct Image Guide Drain Transvag Transrect Peritoneal Retroper  06/26/2015  ADDENDUM REPORT: 06/26/2015 15:45 ADDENDUM: The local anesthetic utilized in the above procedure was bupivacaine given patient's history of lidocaine allergy. The patient tolerated this local anesthetic without adverse complication. Electronically  Signed   By: Sandi Mariscal M.D.   On: 06/26/2015 15:45  06/26/2015  INDICATION: History of colon cancer, post robotic assisted low anterior resection, now with pelvic pain and fever with CT findings worrisome for perianastomotic leak. Please perform CT-guided percutaneous drainage catheter placement. EXAM: CT IMAGE GUIDE DRAIN TRANSVAG TRANSRECT PERITONEAL RETROPER COMPARISON:  CT abdomen pelvis - 06/23/2015; 01/20/2015 MEDICATIONS: The patient is currently admitted to the hospital and receiving intravenous antibiotics. The antibiotics were administered within an appropriate time frame prior to the initiation of the procedure. ANESTHESIA/SEDATION: Fentanyl 100 mcg IV; Versed 6 mg IV Total Moderate Sedation time 19 minutes CONTRAST:  None COMPLICATIONS: None immediate PROCEDURE: Informed written consent was obtained from the patient after a discussion of the risks, benefits and alternatives to treatment. The patient was placed prone on the CT gantry and a pre procedural CT was performed re-demonstrating the known abscess/fluid collection within the presacral space with dominant component measuring approximately 7.2 x 4.5 cm (image 19, series 3). The procedure was planned. A timeout was performed prior to the initiation of the procedure. The skin overlying the medial aspect the right buttocks was prepped and draped in the usual sterile fashion. The overlying soft tissues were anesthetized with 1% lidocaine with epinephrine. Appropriate trajectory was planned with the use of a 22 gauge spinal needle. An 18 gauge trocar needle was advanced into the abscess/fluid collection and a short Amplatz super stiff wire was coiled within the collection. Appropriate positioning was confirmed with a limited CT scan. The tract was serially dilated allowing placement of a 10 [place] all-purpose drainage catheter. Appropriate positioning was confirmed with a limited postprocedural CT scan. Approximately 20 cc of feculent material was  aspirated. Given the feculent appearance of the aspirated fluid, the drainage catheter was connected to a JP bulb. The drainage catheter was sutured in place. A dressing was placed. The patient tolerated the procedure well without immediate post procedural complication. IMPRESSION: Successful CT guided placement of a 10 French all purpose drain catheter into the pelvis via right trans gluteal approach with aspiration of 20 mL of purulent fluid. Samples were sent to the laboratory as requested by the ordering clinical team. Electronically Signed: By: Sandi Mariscal M.D. On: 06/24/2015 13:06    Labs:  CBC:  Recent Labs  06/14/15 0829 06/23/15 2000 06/24/15 0630 06/26/15 0001  WBC 9.9 8.2 6.3 5.9  HGB 8.3* 7.4* 6.5* 6.7*  HCT 26.6* 24.7* 21.5* 22.4*  PLT 537* 421* 362 389    COAGS:  Recent Labs  06/24/15 0630  INR 1.22    BMP:  Recent Labs  05/18/15 1450 05/26/15 0552 06/14/15 0829 06/23/15 2000 06/24/15 0630 06/26/15 0001  NA 140 137 141  --  132* 139  K 4.0 4.3 4.3  --  3.2* 3.7  CL 107 106  --   --  99* 103  CO2 27 27 27   --  25 26  GLUCOSE 144* 115* 99  --  94 103*  BUN 14 10 11.1  --  8 8  CALCIUM 9.7 9.1 10.2  --  8.8* 9.0  CREATININE 0.68 0.60 0.7 0.62 0.59 0.48  GFRNONAA >60 >60  --  >60 >60 >60  GFRAA >60 >60  --  >60 >60 >60    LIVER FUNCTION TESTS:  Recent Labs  03/21/15 0929 03/27/15 0956 04/18/15 0900 06/14/15 0829  BILITOT 0.41 0.31 0.33 <0.30  AST 11 14 10 10   ALT 16 16 13 12   ALKPHOS 62 64 72 78  PROT 6.8 6.6 7.0 7.5  ALBUMIN 3.7 3.6 3.7 2.7*    Assessment and Plan: S/p LAR 05/25/15 for mid rectal cancer; s/p drainage of right pelvic abscess 12/10; no new labs today; abscess cx's pend; for pelvic drain injection/probable drain upsizing today due to minimal output- plans d/w pt/husband with their understanding and consent; monitor labs   Signed: D. Rowe Robert 06/28/2015, 9:24 AM   I spent a total of 15 minutes at the the patient's  bedside AND on the patient's hospital floor or unit, greater than 50% of which was counseling/coordinating care for pelvic abscess drain

## 2015-06-29 ENCOUNTER — Ambulatory Visit: Payer: BLUE CROSS/BLUE SHIELD

## 2015-06-29 ENCOUNTER — Other Ambulatory Visit: Payer: Self-pay | Admitting: *Deleted

## 2015-06-29 ENCOUNTER — Other Ambulatory Visit: Payer: Self-pay | Admitting: Surgery

## 2015-06-29 ENCOUNTER — Other Ambulatory Visit: Payer: Self-pay | Admitting: Radiology

## 2015-06-29 ENCOUNTER — Ambulatory Visit: Payer: BLUE CROSS/BLUE SHIELD | Admitting: Hematology

## 2015-06-29 ENCOUNTER — Other Ambulatory Visit: Payer: BLUE CROSS/BLUE SHIELD

## 2015-06-29 DIAGNOSIS — N739 Female pelvic inflammatory disease, unspecified: Secondary | ICD-10-CM

## 2015-06-29 DIAGNOSIS — C20 Malignant neoplasm of rectum: Secondary | ICD-10-CM

## 2015-06-29 LAB — CULTURE, ROUTINE-ABSCESS

## 2015-06-29 MED ORDER — SODIUM CHLORIDE 0.9 % IV SOLN
510.0000 mg | Freq: Once | INTRAVENOUS | Status: AC
  Administered 2015-06-29: 510 mg via INTRAVENOUS
  Filled 2015-06-29: qty 17

## 2015-06-29 MED ORDER — SODIUM CHLORIDE 0.9 % IV SOLN: 510.0000 mg | Freq: Once | INTRAVENOUS | Status: DC

## 2015-06-29 MED ORDER — HEPARIN SOD (PORK) LOCK FLUSH 100 UNIT/ML IV SOLN
500.0000 [IU] | INTRAVENOUS | Status: AC | PRN
  Administered 2015-06-29: 500 [IU]

## 2015-06-29 NOTE — Progress Notes (Signed)
Oncology Nurse Navigator Documentation  Oncology Nurse Navigator Flowsheets 06/29/2015  Referral date to RadOnc/MedOnc -  Navigator Encounter Type -  Patient Visit Type -  Treatment Phase -  Barriers/Navigation Needs -  Education -  Interventions Coordination of Care  Referrals -  Coordination of Care MD Appointments--F/U in 3 weeks with Dr. Burr Medico; cancel infusion for 12/29  Education Method -  Support Groups/Services -  Time Spent with Patient -  Just left hospital today-per Dr. Burr Medico, cancel chemo on 12/29 with OV in 3 weeks. POF entered in EPIC.

## 2015-06-29 NOTE — Progress Notes (Signed)
[facility]  [address] [place], [place] [address] Phone: [phone] FAX: [phone]   Lindsay Little [id number] 07-01-1966   Assessment  Pelvic abscess w probable contained leak - recovering slowly  Problem List:   Principal Problem:   Pelvic abscess in female Active Problems:   Morbid obesity with BMI of 50.0-59.9, adult ([place])   Rectal adenocarcinoma s/p LAR resection 05/25/2015   Crohn's disease ([place])   Hypertension   Depression   Anemia of chronic disease   Protein-calorie malnutrition, moderate ([place])   Cystadenoma of right ovary s/p RSO 05/25/2015   Plan:  -Pelvic fluid collection drained.  Drain upsized yesterday.  Husband helping with flushes  -IV ABx - f/u Cx. Multiple organisms but no yeast.  Cont Cipro/flagyl w PCN allergy  -PO w supp shakes.    -solid diet as tolerated since low output fistula  -diarrhea controlled with imodium & drain/IV ABx  -stop PO iron with h/o nausea.   -PO dilaudid instead for pain control - seems to be working better  -loop ileostomy if worsens  -anemia severe but stable.  s/p IV iron last week - repeat x 1 for 2/2 doses per Heme/Onc's plan last week  -anxiolysis  -hypoK - replace  -VTE prophylaxis- SCDs, etc  -mobilize as tolerated to help recovery  D/C patient from hospital when patient meets criteria (anticipate in later today):  Tolerating oral intake well IV iron given Ambulating well Adequate pain control without IV medications Urinating  Having flatus Disposition planning in place   [name], M.D., F.A.C.S. Gastrointestinal and Minimally Invasive Surgery [facility] [address] [place], [place] [address] [phone] Main / Paging   06/29/2015  Subjective:  Less sore inner pelvis.  Dilaudid working well.  Not needing IV meds last 6 hours  Husband at bedside.  Much less nausea w/o iron PO  BMs  better formed - less fecal urgency  Objective:  Vital signs:  Filed Vitals:   06/28/15 1204 06/28/15 1209 06/28/15 2141 06/29/15 0518  BP: 149/94 185/75 126/61 122/47  Pulse: 108 99 95 78  Temp:   98.3 F (36.8 C) 98.8 F (37.1 C)  TempSrc:   Axillary Oral  Resp: 23 18 18 16   Height:      Weight:      SpO2: 99% 99% 97% 96%    Last BM Date: 06/23/15  Intake/Output   Yesterday:  12/14 0701 - 12/15 0700 In: 3740 [P.O.:240; I.V.:3200; IV Piggyback:300] Out: 15 [Drains:15] This shift:     Bowel function:  Flatus: y  BM: yes.  More formed  Drain: thick brown feculent  Physical Exam:  General: Pt awake/alert/oriented x4 in moderate acute distress. Pale. Not toxic. Mildly tired but stable Eyes: PERRL, normal EOM. Sclera nonicteric Neuro: CN II-XII intact w/o focal sensory/motor deficits. Lymph: No head/neck/groin lymphadenopathy Psych: No delerium/psychosis/paranoia. Chronically shaking her legs. Consolable HENT: Normocephalic, Mucus membranes moist. No thrush Neck: Supple, No tracheal deviation Chest: No pain. Good respiratory excursion. CV: Pulses intact. Regular rhythm Abdomen: Soft, Nondistended. Morbidly obese. Incisions well-healed. No cellulitis or infection at extraction incision. Mild discomfort left suprapubic region. No incarcerated hernias. Normal external genitalia. No vaginal bleeding or discharge. No hernias. Rectal:. Perianal skin clean.  Drain site clean but tender Ext: SCDs BLE. No significant edema. No cyanosis Skin: No petechiae / purpurea. No major sores Musculoskeletal: No severe joint pain. Good ROM major joints  Results:   Labs: Results  for orders placed or performed during the hospital encounter of 06/23/15 (from the past 48 hour(s))  CBC with Differential/Platelet     Status: Abnormal   Collection Time: 06/28/15 10:00 AM  Result Value Ref Range   WBC 6.3 4.0 - 10.5 K/uL   RBC 2.60 (L) 3.87 - 5.11 MIL/uL   Hemoglobin  6.7 (LL) 12.0 - 15.0 g/dL    Comment: REPEATED TO VERIFY CRITICAL VALUE NOTED.  VALUE IS CONSISTENT WITH PREVIOUSLY REPORTED AND CALLED VALUE.    HCT 22.5 (L) 36.0 - 46.0 %   MCV 86.5 78.0 - 100.0 fL   MCH 25.8 (L) 26.0 - 34.0 pg   MCHC 29.8 (L) 30.0 - 36.0 g/dL   RDW 18.5 (H) 11.5 - 15.5 %   Platelets 396 150 - 400 K/uL   Neutrophils Relative % 77 %   Neutro Abs 4.8 1.7 - 7.7 K/uL   Lymphocytes Relative 8 %   Lymphs Abs 0.5 (L) 0.7 - 4.0 K/uL   Monocytes Relative 10 %   Monocytes Absolute 0.6 0.1 - 1.0 K/uL   Eosinophils Relative 5 %   Eosinophils Absolute 0.3 0.0 - 0.7 K/uL   Basophils Relative 0 %   Basophils Absolute 0.0 0.0 - 0.1 K/uL    Imaging / Studies: Ir Catheter Tube Change  06/28/2015  CLINICAL DATA:  49 year old female with a history of rectal adenocarcinoma status post low anterior resection 05/25/2015. Postoperative course was complicated by anastomotic leak and development of perirectal presacral abscess. A trans gluteal 75F drainage catheter was placed on 06/24/2015. The drainage catheter output is minimal. Patient presents for reposition and up size. EXAM: IR CATHETER TUBE CHANGE Date: 06/28/2015 PROCEDURE: 1. Contrast injection through existing catheter 2. Reposition and upsize of drainage catheter Interventional Radiologist:  Criselda Peaches, MD ANESTHESIA/SEDATION: Moderate (conscious) sedation was used. 8 mg Versed, 100 mcg Fentanyl were administered intravenously. The patient's vital signs were monitored continuously by radiology nursing throughout the procedure. Sedation Time: 26 minutes MEDICATIONS: None additional FLUOROSCOPY TIME:  2 minutes 24 seconds for a total of 178 mGy CONTRAST:  10 mL Omnipaque 300 TECHNIQUE: Informed consent was obtained from the patient following explanation of the procedure, risks, benefits and alternatives. The patient understands, agrees and consents for the procedure. All questions were addressed. A time out was performed. Maximal  barrier sterile technique utilized including caps, mask, sterile gowns, sterile gloves, large sterile drape, hand hygiene, and Betadine skin prep. An initial fluoroscopic spot image demonstrates an incompletely formed catheter along the right pelvic sidewall. Contrast injection confirms that the catheter is in communication with the flocculent abscess cavity. Additionally, contrast material passes through the rectal anastomosis and out the anus during injection. The tube was transected and removed over a wire. Using an angled catheter, the wire was carefully advanced more medially and superiorly into the abscess collection. The skin tract was then serially dilated to 14 [place] and a Cook 14 [place] all-purpose drainage catheter modified with additional sideholes was advanced into the abscess collection and formed. There was immediate return of purely feculent material. The cavity was lavaged several times with saline and additional material and debris aspirated. COMPLICATIONS: None IMPRESSION: 1. Contrast injection confirms residual abscess cavity containing flocculent material and debris. 2. Successful repositioning of the drainage catheter more medially and superiorly as well as upsized to 14 [place]. 3. Aspiration and lavage of the abscess cavity yields purely feculent material and debris. Electronically Signed   By: Jacqulynn Cadet M.D.   On:  06/28/2015 12:47    Medications / Allergies: per chart  Antibiotics: Anti-infectives    Start     Dose/Rate Route Frequency Ordered Stop   06/28/15 0000  metroNIDAZOLE (FLAGYL) 500 MG tablet     500 mg Oral 3 times daily 06/28/15 0718     06/28/15 0000  ciprofloxacin (CIPRO) 500 MG tablet     500 mg Oral 2 times daily 06/28/15 0718     06/27/15 0900  ciprofloxacin (CIPRO) tablet 500 mg     500 mg Oral 2 times daily 06/27/15 0735     06/23/15 1800  ciprofloxacin (CIPRO) IVPB 400 mg  Status:  Discontinued     400 mg 200 mL/hr over 60 Minutes Intravenous Every  12 hours 06/23/15 1647 06/27/15 0735   06/23/15 1800  metroNIDAZOLE (FLAGYL) IVPB 500 mg     500 mg 100 mL/hr over 60 Minutes Intravenous Every 6 hours 06/23/15 1647          POST-OPERATIVE DIAGNOSIS:   Proximal Rectal Cancer  SURGERY: 05/25/2015  XI ROBOTIC ASSISTED RECTOSIGMOID LOWER ANTERIOR RESECTION Laparoscopic and robotic lysis of adhesions X 60 MINUTES RIGID PROCTOSCOPY RIGHT SALPINGO-OOPHORECTOMY INSERTION PORT-A-CATH in R IJ  SURGEON:   Surgeon(s): Michael Boston, MD Leighton Ruff, MD  OR FINDINGS:   Patient had a bulky tumor in proximal and mid rectum. Most of it focused on the right anterolateral circumference. Still came down to around 9 cm from the anal verge.  She had an enlarged right ovary was some firm and mainly cystic areas. At least 6 cm in largest dimension. Left side looked atrophic and normal. Right ovary removed.  PowerPort is a ClearVUE 8 [place]. Reference #[id number] Lot number [id number] It is in the right internal jugular vein. The tip sits at the right atrial/superior vena caval junction  No obvious metastatic disease on visceral parietal peritoneum or liver.  The anastomosis rests 5-6 cm from the anal verge by rigid proctoscopy  Diagnosis 1. Colon, segmental resection for tumor, recto-sigmoid INVASIVE COLONIC ADENOCARCINOMA (0.2 CM IN GREATEST DIMENSION RESIDUAL TUMOR) THE TUMOR INVADE SUBMUCOSA (0.3 CM IN DEPTH, PT1) MARGINS OF RESECTION ARE NEGATIVE FOR TUMOR TWELVE BENIGN LYMPH NODES (0/12) 2. Colon, resection margin (donut), proximal anastomotic ring BENIGN COLONIC TISSUE, NEGATIVE FOR MALIGANCY 3. Colon, resection margin (donut), distal anastomotic ring BENIGN COLONIC TISSUE, NEGATIVE FOR MALIGNANCY 4. Ovary, right BENIGN SEROUS CYSTADENOMA Microscopic Comment 1. COLON AND RECTUM (INCLUDING TRANS-ANAL RESECTION): Specimen: Colon-rectum Procedure: Segmental resection Tumor site: Rectum anterior wall mid to distal third of  rectum Specimen integrity: intact Macroscopic intactness of mesorectum: Complete: _ Macroscopic tumor perforation: Negative Invasive tumor: Maximum size: Histologic type(s): G2 Histologic grade and differentiation: G1: well differentiated/low grade G2: moderately differentiated/low grade G3: poorly differentiated/high grade G4: undifferentiated/high grade Type of polyp in which invasive carcinoma arose: _ 1 of 3 Duplicate copy FINAL for AKEIA, PEROT N [phone]) Microscopic Comment(continued) Microscopic extension of invasive tumor:Submucosa Lymph-Vascular invasion: Not identified Peri-neural invasion: negative Tumor deposit(s) (discontinuous extramural extension): Negative Resection margins: Proximal margin: Negative Distal margin: Negative Circumferential (radial) (posterior ascending, posterior descending; lateral and posterior mid-rectum; and entire lower 1/3 rectum):Negatvie Mesenteric margin (sigmoid and transverse): Negative Distance closest margin (if all above margins negative): 2.5 cm Treatment effect (neo-adjuvant therapy): Present Additional polyp(s): Negative Non-neoplastic findings: Unremarkable Lymph nodes: number examined 12; number positive: 0 Pathologic Staging: T1, N0, M_ Ancillary studies: Preserved expression of the makor and minor MMR proteins MLH1, MSH2, MSH6 and PMS2. MLH1: Casimer Lanius MD Pathologist, Electronic Signature (Case  signed 05/31/2015) Specimen Keven Soucy and Clinical Information Specimen(s) Obtained: 1. Colon, segmental resection for tumor, recto-sigmoid 2. Colon, resection margin (donut), proximal anastomotic ring 3. Colon, resection margin (donut), distal anastomotic ring 4. Ovary, right Specimen Clinical Information 1. proximal rectal cancer [rd] Warda Mcqueary 1. Specimen: Rectosigmoid, to include at least portion of distal 1/3 of rectum. Specimen integrity: Intact Specimen length: 38 cm. 11 cm from sigmoid rectal junction to distal  margin, and 4 cm from peritoneal reflection to distal margin. Mesorectal intactness: Complete Tumor location: Anterior wall, middle third to distal third of rectum Tumor size: There is a 4 cm in length and 2.2 cm in diameter area of healing ulceration with underlying indurated tan white dense tissue up to 1.1 cm thick. A discrete mass is not identified. Percent of bowel circumference involved: 40% Tumor distance to margins: Proximal: 28 cm Distal: 6 cm Radial (posterior ascending, posterior descending; lateral and posterior mid-rectum; and entire lower 1/3 rectum): The area of dense indurated tissue is 2.5 cm from the perirectal soft tissue margin. 2 of 3 Duplicate copy FINAL for KAHLIYA, FRALEIGH [phone]) Ido Wollman(continued) Macroscopic extent of tumor invasion: No definite mass to evaluate invasion Total presumed lymph nodes: Found are seventeen possible lymph nodes ranging from 0.4 to 1.3 cm Extramural satellite tumor nodules: None Mucosal polyp(s): None Additional findings: None Block summary: A = proximal margin B = distal margin C-F = sections of ulceration and underlying indurated tissue G-J = ulceration and underlying indurated tissue, with nearest serosa/peritoneal reflection. K = perirectal soft tissue margin nearest indurated tissue L = four possible nodes, whole M = four possible nodes, whole N = four possible nodes, whole O = four possible node, whole P = one node bisected Total = sixteen blocks. 2. Received in formalin is a 2 cm in diameter and 1 cm thick ring of tan to hyperemic smooth mucosa with underlying tissue with embedded suture material. Representative sections are submitted in one block. 3. Received in formalin is a 1.4 cm in diameter and up to 1 cm thick ring of tan to hyperemic smooth mucosa and underlying tissue with embedded metallic staples. Representative sections submitted in one block. 4. Received in formalin is a 5.1 x 4.4 x 3.4 cm ovary,  clinically right, which has a tan pink to dark red exterior with scattered adhesions. There are also portions of attached tissue consistent with fimbria, however definite tube is not identified. On sectioning, there are several serous and mucoid filled cysts which are up to 4 cm in greatest dimension, and have smooth inner surfaces with no areas of excrescences. Tissue adjacent to the largest cyst is focally tan white, firm and dense. Representative sections are submitted in five blocks. (SSW:kh 05-26-15) Report signed out from the following location(s) Technical component and interpretation was performed at [facility], [place], [place] [address]. CLIA #: [id number], 3 of 3  Note: Portions of this report may have been transcribed using voice recognition software. Every effort was made to ensure accuracy; however, inadvertent computerized transcription errors may be present.   Any transcriptional errors that result from this process are unintentional.     [name], M.D., F.A.C.S. Gastrointestinal and Minimally Invasive Surgery [facility] [address] [place], [place] [address] [phone] Main / Paging   06/29/2015  CARE TEAM:  PCP: Vidal Schwalbe, MD  Outpatient Care Team: Patient Care Team: [name], MD as PCP - General (Family Medicine) Michael Boston, MD as  Consulting Physician (General Surgery) Truitt Merle, MD as Consulting Physician (Medical Oncology) Kyung Rudd, MD as Consulting Physician (Radiation Oncology) Teena Irani, MD as Consulting Physician (Gastroenterology)  Inpatient Treatment Team: Treatment Team: Attending Provider: Michael Boston, MD; Technician: Abbe Amsterdam, NT; Technician: Sueanne Margarita, NT; Technician: Leda Quail, NT; Registered Nurse: Charlena Cross, RN; Registered Nurse: Imogene Burn, RN; Registered Nurse: Mortimer Fries, RN

## 2015-06-29 NOTE — Discharge Summary (Signed)
Physician Discharge Summary  [name] MRN: [id number] DOB/AGE: Apr 10, 1966 49 y.o.  Admit date: 06/23/2015 Discharge date: 06/29/2015  Patient Care Team: [name], MD as PCP - General (Family Medicine) Michael Boston, MD as Consulting Physician (General Surgery) Truitt Merle, MD as Consulting Physician ([place]) Kyung Rudd, MD as Consulting Physician (Radiation Oncology) Teena Irani, MD as Consulting Physician (Gastroenterology)  Admission Diagnoses: Principal Problem:   Pelvic abscess in female Active Problems:   Morbid obesity with BMI of 50.0-59.9, adult ([place])   Rectal adenocarcinoma s/p LAR resection 05/25/2015   Crohn's disease ([place])   Hypertension   Depression   Anemia of chronic disease   Protein-calorie malnutrition, moderate ([place])   Cystadenoma of right ovary s/p RSO 05/25/2015   Discharge Diagnoses:  Principal Problem:   Pelvic abscess in female Active Problems:   Morbid obesity with BMI of 50.0-59.9, adult ([place])   Rectal adenocarcinoma s/p LAR resection 05/25/2015   Crohn's disease ([place])   Hypertension   Depression   Anemia of chronic disease   Protein-calorie malnutrition, moderate ([place])   Cystadenoma of right ovary s/p RSO 05/25/2015   POST-OPERATIVE DIAGNOSIS:   * No surgery found *  SURGERY:  * No surgery found *    SURGEON:      Consults: None  Hospital Course:   The patient underwent robotoic low anterior resection 4.5 weeks ago complicated but worsening diarrhea & pelvic pain.  Postoperatively, the patient gradually mobilized and advanced to a solid diet.  Pain and other symptoms were treated aggressively.  She went home.  She had struggle with irregular bowels.  Poor pain control.  Improved with a bowel regimen & adjusting medications.  .Then worsened.  CT scan concerning for gas and fluid for probable abscess.  Admitted.  Drain placed.  She was placed on intravenous antibiotics.  We worked to help control her  diarrhea.  That began to improve.  She also struggles with chronic pain and chronic nausea.  He just pain medications.  Oral Dilaudid and IV fentanyl seem to work best.  She still had significant anemia.  Tried to restart oral ironbut her nausea markedly worsened.  Therefore stopped it. Gave another infusion IV Feraheme iron prior to discharge.  She had received one the week before by her hematologist/oncologist, Dr. Burr Medico.   She was due to get her second infusion on the day of discharge anyway.  The plans to give post-adjuvant chemotherapy will be held.  Dr. Burr Medico is aware and agrees with these plans.   By the time of discharge, the patient was walking better in the hallways, eating food, having flatus.  Pain was better controlled on an oral medications.  Based on meeting discharge criteria and continuing to recover, I felt it was safe for the patient to be discharged from the hospital to further recover with close followup. Postoperative recommendations were discussed in detail.  They are written as well.  Nursing and patient and husband agree with this plan   Significant Diagnostic Studies:  Results for orders placed or performed during the hospital encounter of 06/23/15 (from the past 72 hour(s))  CBC with Differential/Platelet     Status: Abnormal   Collection Time: 06/28/15 10:00 AM  Result Value Ref Range   WBC 6.3 4.0 - 10.5 K/uL   RBC 2.60 (L) 3.87 - 5.11 MIL/uL   Hemoglobin 6.7 (LL) 12.0 - 15.0 g/dL    Comment: REPEATED TO VERIFY CRITICAL VALUE NOTED.  VALUE IS CONSISTENT WITH PREVIOUSLY  REPORTED AND CALLED VALUE.    HCT 22.5 (L) 36.0 - 46.0 %   MCV 86.5 78.0 - 100.0 fL   MCH 25.8 (L) 26.0 - 34.0 pg   MCHC 29.8 (L) 30.0 - 36.0 g/dL   RDW 18.5 (H) 11.5 - 15.5 %   Platelets 396 150 - 400 K/uL   Neutrophils Relative % 77 %   Neutro Abs 4.8 1.7 - 7.7 K/uL   Lymphocytes Relative 8 %   Lymphs Abs 0.5 (L) 0.7 - 4.0 K/uL   Monocytes Relative 10 %   Monocytes Absolute 0.6 0.1 - 1.0 K/uL    Eosinophils Relative 5 %   Eosinophils Absolute 0.3 0.0 - 0.7 K/uL   Basophils Relative 0 %   Basophils Absolute 0.0 0.0 - 0.1 K/uL    Ir Catheter Tube Change  06/28/2015  CLINICAL DATA:  49 year old female with a history of rectal adenocarcinoma status post low anterior resection 05/25/2015. Postoperative course was complicated by anastomotic leak and development of perirectal presacral abscess. A trans gluteal 92F drainage catheter was placed on 06/24/2015. The drainage catheter output is minimal. Patient presents for reposition and up size. EXAM: IR CATHETER TUBE CHANGE Date: 06/28/2015 PROCEDURE: 1. Contrast injection through existing catheter 2. Reposition and upsize of drainage catheter Interventional Radiologist:  Criselda Peaches, MD ANESTHESIA/SEDATION: Moderate (conscious) sedation was used. 8 mg Versed, 100 mcg Fentanyl were administered intravenously. The patient's vital signs were monitored continuously by radiology nursing throughout the procedure. Sedation Time: 26 minutes MEDICATIONS: None additional FLUOROSCOPY TIME:  2 minutes 24 seconds for a total of 178 mGy CONTRAST:  10 mL Omnipaque 300 TECHNIQUE: Informed consent was obtained from the patient following explanation of the procedure, risks, benefits and alternatives. The patient understands, agrees and consents for the procedure. All questions were addressed. A time out was performed. Maximal barrier sterile technique utilized including caps, mask, sterile gowns, sterile gloves, large sterile drape, hand hygiene, and Betadine skin prep. An initial fluoroscopic spot image demonstrates an incompletely formed catheter along the right pelvic sidewall. Contrast injection confirms that the catheter is in communication with the flocculent abscess cavity. Additionally, contrast material passes through the rectal anastomosis and out the anus during injection. The tube was transected and removed over a wire. Using an angled catheter, the  wire was carefully advanced more medially and superiorly into the abscess collection. The skin tract was then serially dilated to 14 [place] and a Cook 14 [place] all-purpose drainage catheter modified with additional sideholes was advanced into the abscess collection and formed. There was immediate return of purely feculent material. The cavity was lavaged several times with saline and additional material and debris aspirated. COMPLICATIONS: None IMPRESSION: 1. Contrast injection confirms residual abscess cavity containing flocculent material and debris. 2. Successful repositioning of the drainage catheter more medially and superiorly as well as upsized to 14 [place]. 3. Aspiration and lavage of the abscess cavity yields purely feculent material and debris. Electronically Signed   By: Jacqulynn Cadet M.D.   On: 06/28/2015 12:47   Specimen Description ABSCESS BUTTOCKS RIGHT   Special Requests NONE   Gram Stain FEW WBC PRESENT, PREDOMINANTLY PMN  NO SQUAMOUS EPITHELIAL CELLS SEEN  ABUNDANT GRAM NEGATIVE RODS  ABUNDANT GRAM POSITIVE RODS  ABUNDANT GRAM POSITIVE COCCI IN PAIRS       Culture MULTIPLE ORGANISMS PRESENT, NONE PREDOMINANT  Note: NO STAPHYLOCOCCUS AUREUS ISOLATED NO GROUP A STREP (S.PYOGENES) ISOLATED  Performed at [facility]       Report  Status 06/29/2015 FINAL           Discharge Exam: Blood pressure 122/47, pulse 78, temperature 98.8 F (37.1 C), temperature source Oral, resp. rate 16, height 5' 10.5" (1.791 m), weight 146.512 kg (323 lb), SpO2 96 %.  General: Pt awake/alert/oriented x4 in moderate acute distress. Pale. Not toxic. Mildly tired but stable Eyes: PERRL, normal EOM. Sclera nonicteric Neuro: CN II-XII intact w/o focal sensory/motor deficits. Lymph: No head/neck/groin lymphadenopathy Psych: No delerium/psychosis/paranoia. Chronically shaking her legs. Consolable HENT: Normocephalic, Mucus membranes moist. No thrush Neck: Supple, No tracheal  deviation Chest: No pain. Good respiratory excursion. CV: Pulses intact. Regular rhythm Abdomen: Soft, Nondistended. Morbidly obese. Incisions well-healed. No cellulitis or infection at extraction incision. Mild discomfort left suprapubic region. No incarcerated hernias. Normal external genitalia. No vaginal bleeding or discharge. No hernias. Rectal:. Perianal skin clean. Drain site clean but tender Ext: SCDs BLE. No significant edema. No cyanosis Skin: No petechiae / purpurea. No major sores Musculoskeletal: No severe joint pain. Good ROM major joints  Discharged Condition: stable   Past Medical History  Diagnosis Date  . Hypertension   . Hypercholesteremia   . Obesity   . Tear of left rotator cuff   . IBS (irritable bowel syndrome)   . Vertigo   . TMJ (dislocation of temporomandibular joint)   . Bulging disc     L5  . Depression   . Rectal bleeding   . Vitamin D deficiency   . Osteoarthritis, knee   . S/P radiation therapy 02/20/15-03/30/15    colon/rectal  . Complication of anesthesia   . PONV (postoperative nausea and vomiting)   . History of mitral valve prolapse   . Mild sleep apnea     does not use CPAP  . History of chemotherapy   . Numbness and tingling     great toe bilat left more than right   . History of frequent ear infections   . History of frequent urinary tract infections     middle school age  . GERD (gastroesophageal reflux disease)   . Cancer ([place])   . Anemia   . History of blood transfusion   . Fibroids   . Cystadenoma of right ovary s/p RSO 05/25/2015 06/28/2015    Past Surgical History  Procedure Laterality Date  . Rotator cuff repair    . Eus N/A 01/18/2015    Procedure: LOWER ENDOSCOPIC ULTRASOUND (EUS);  Surgeon: Arta Silence, MD;  Location: [facility];  Service: Endoscopy;  Laterality: N/A;  . Abdominal hysterectomy  1996    Removed uterus, and tubes  . Left knee arthroscopy      mult  . Tonsillectomy    . Dilation  and curettage of uterus      times 2  . Xi robotic assisted lower anterior resection N/A 05/25/2015    Procedure: XI ROBOTIC ASSISTED LOWER ANTERIOR RESECTION, , RIGID PROCTOSCOPY, RIGHT OOPHORECTOMY;  Surgeon: Michael Boston, MD;  Location: [facility];  Service: General;  Laterality: N/A;  . Portacath placement N/A 05/25/2015    Procedure: INSERTION PORT-A-CATH;  Surgeon: Michael Boston, MD;  Location: [facility];  Service: General;  Laterality: N/A;    Social History   Social History  . Marital Status: Married    Spouse Name: N/A  . Number of Children: N/A  . Years of Education: N/A   Occupational History  . Not on file.   Social History Main Topics  . Smoking status: Former Smoker -- 0.50 packs/day for 4 years  Types: Cigarettes    Quit date: 07/15/2000  . Smokeless tobacco: Never Used     Comment: 1 pack/week intermittently x 7 years  . Alcohol Use: 0.6 oz/week    1 Shots of liquor per week     Comment: rarely   . Drug Use: No  . Sexual Activity: Not on file   Other Topics Concern  . Not on file   Social History Narrative   Tobacco Use: Cigarettes - Former Smoker   Alcohol: Yes, very rare, liquor.    No recreational drug use   Occupation: Head CMA @ [employer]   Marital Status: Married    Husband: Roselyn Reef Disabled   Children: 2 adopted kids [place]   Religion: [employer] [place]                   Family History  Problem Relation Age of Onset  . Coronary artery disease Mother 59  . Hypertension Mother   . Hyperlipidemia Mother   . Diabetes Mellitus I Mother   . Coronary artery disease Father   . Hyperlipidemia Father   . Hypertension Father   . Cancer Sister     skin - non melanoma  . Hyperlipidemia Brother   . Cancer Maternal Uncle 97    pancreatic with mets to colon and prostate  . Cirrhosis Maternal Uncle   . Hypertension Maternal Grandmother   . Diabetes Mellitus I Maternal Grandmother   . Hyperlipidemia Maternal Grandmother   . CVA  Maternal Grandmother   . Hypertension Maternal Grandfather   . Coronary artery disease Maternal Grandfather 65  . Hyperlipidemia Maternal Grandfather   . Coronary artery disease Paternal Grandmother   . Hypertension Paternal Grandmother   . Hyperlipidemia Paternal Grandmother   . Diabetes Mellitus I Paternal Grandmother   . Hypertension Paternal Grandfather   . Hyperlipidemia Paternal Grandfather   . Coronary artery disease Paternal Grandfather     Current Facility-Administered Medications  Medication Dose Route Frequency Provider Last Rate Last Dose  . acetaminophen (TYLENOL) tablet 1,000 mg  1,000 mg Oral TID Michael Boston, MD   1,000 mg at 06/28/15 2322  . alum & mag hydroxide-simeth (MAALOX/MYLANTA) 200-200-20 MG/5ML suspension 30 mL  30 mL Oral Q6H PRN Michael Boston, MD      . bismuth subsalicylate (PEPTO BISMOL) 262 MG/15ML suspension 30 mL  30 mL Oral Q8H PRN Michael Boston, MD      . ciprofloxacin (CIPRO) tablet 500 mg  500 mg Oral BID Michael Boston, MD   500 mg at 06/29/15 0745  . diphenhydrAMINE (BENADRYL) injection 12.5-25 mg  12.5-25 mg Intravenous Q6H PRN Michael Boston, MD      . DULoxetine (CYMBALTA) DR capsule 60 mg  60 mg Oral BID Michael Boston, MD   60 mg at 06/28/15 2337  . feeding supplement (ENSURE ENLIVE) (ENSURE ENLIVE) liquid 237 mL  237 mL Oral BID BM Clayton Bibles, RD   237 mL at 06/26/15 1000  . fentaNYL (SUBLIMAZE) injection 25-100 mcg  25-100 mcg Intravenous Q2H PRN Michael Boston, MD      . ferumoxytol [facility]) 510 mg in sodium chloride 0.9 % 100 mL IVPB  510 mg Intravenous Once Michael Boston, MD      . heparin injection 5,000 Units  5,000 Units Subcutaneous 3 times per day Michael Boston, MD   5,000 Units at 06/29/15 [phone]  . HYDROmorphone (DILAUDID) tablet 4-8 mg  4-8 mg Oral Q3H PRN Michael Boston, MD   4  mg at 06/29/15 8338  . hyoscyamine (LEVBID) 0.375 MG 12 hr tablet 0.375 mg  0.375 mg Oral Q12H PRN Michael Boston, MD      . lactated ringers bolus 1,000 mL  1,000 mL  Intravenous Once Michael Boston, MD      . lactated ringers infusion   Intravenous Continuous Michael Boston, MD 100 mL/hr at 06/29/15 0252    . lip balm (CARMEX) ointment 1 application  1 application Topical BID Michael Boston, MD   1 application at 25/05/39 2323  . loperamide (IMODIUM) capsule 2-4 mg  2-4 mg Oral Q8H PRN Michael Boston, MD      . loperamide (IMODIUM) capsule 4 mg  4 mg Oral QHS Michael Boston, MD   4 mg at 06/28/15 2331  . LORazepam (ATIVAN) injection 0.5-1 mg  0.5-1 mg Intravenous Q8H PRN Michael Boston, MD      . magic mouthwash  15 mL Oral QID PRN Michael Boston, MD      . meloxicam [facility]) tablet 15 mg  15 mg Oral Daily Michael Boston, MD   15 mg at 06/28/15 1251  . menthol-cetylpyridinium (CEPACOL) lozenge 3 mg  1 lozenge Oral PRN Michael Boston, MD      . methocarbamol (ROBAXIN) 1,000 mg in dextrose 5 % 50 mL IVPB  1,000 mg Intravenous Q6H PRN Michael Boston, MD      . methocarbamol (ROBAXIN) tablet 500-1,000 mg  500-1,000 mg Oral QID PRN Michael Boston, MD      . metoprolol (LOPRESSOR) injection 5 mg  5 mg Intravenous Q6H PRN Michael Boston, MD      . metroNIDAZOLE (FLAGYL) IVPB 500 mg  500 mg Intravenous Q6H Michael Boston, MD   500 mg at 06/29/15 7673  . nebivolol (BYSTOLIC) tablet 10 mg  10 mg Oral QHS Michael Boston, MD   10 mg at 06/26/15 2240  . nystatin (MYCOSTATIN/NYSTOP) topical powder   Topical BID Michael Boston, MD      . ondansetron [facility]) injection 4 mg  4 mg Intravenous Q6H PRN Michael Boston, MD   4 mg at 06/28/15 0817   Or  . ondansetron (ZOFRAN) 8 mg in sodium chloride 0.9 % 50 mL IVPB  8 mg Intravenous Q6H PRN Michael Boston, MD      . ondansetron [facility]) tablet 4-8 mg  4-8 mg Oral Q8H PRN Michael Boston, MD      . phenol (CHLORASEPTIC) mouth spray 2 spray  2 spray Mouth/Throat PRN Michael Boston, MD      . potassium chloride SA (K-DUR,KLOR-CON) CR tablet 40 mEq  40 mEq Oral BID Michael Boston, MD   40 mEq at 06/28/15 2322  . promethazine (PHENERGAN) injection 6.25-12.5 mg  6.25-12.5 mg  Intravenous Q4H PRN Michael Boston, MD   12.5 mg at 06/28/15 4193  . promethazine (PHENERGAN) tablet 25 mg  25 mg Oral Q6H PRN Michael Boston, MD      . rosuvastatin (CRESTOR) tablet 5 mg  5 mg Oral QHS Michael Boston, MD   5 mg at 06/28/15 2323  . saccharomyces boulardii (FLORASTOR) capsule 250 mg  250 mg Oral BID Michael Boston, MD   250 mg at 06/28/15 2322  . sodium chloride 0.9 % injection 10-40 mL  10-40 mL Intracatheter Q12H Michael Boston, MD   1 mL at 06/28/15 2324  . sodium chloride 0.9 % injection 10-40 mL  10-40 mL Intracatheter PRN Michael Boston, MD      . vitamin C (ASCORBIC ACID) tablet 500 mg  500  mg Oral BID Michael Boston, MD   500 mg at 06/28/15 2325  . Vitamin D (Ergocalciferol) (DRISDOL) capsule 50,000 Units  50,000 Units Oral Once per day on Mon Thu Michael Boston, MD   50,000 Units at 06/26/15 0801  . witch hazel-glycerin (TUCKS) pad 1 application  1 application Topical PRN Michael Boston, MD      . zolpidem (AMBIEN) tablet 5 mg  5 mg Oral QHS PRN Michael Boston, MD         Allergies  Allergen Reactions  . Caine-1 [Lidocaine] Rash    Eyes swell shut; includes all caine drugs except marcaine  . Oxycodone     NIGHTMARES. (tolerates hydrocodone or tramadol better)  . Penicillins Rash    Has patient had a PCN reaction causing immediate rash, facial/tongue/throat swelling, SOB or lightheadedness with hypotension: no Has patient had a PCN reaction causing severe rash involving mucus membranes or skin necrosis: no Has patient had a PCN reaction that required hospitalization no Has patient had a PCN reaction occurring within the last 10 years: no If all of the above answers are "NO", then may proceed with Cephalosporin use.   . Sulfa Antibiotics Rash    Rash & Vomiting    Disposition: 01-Home or Self Care  Discharge Instructions    Call MD for:  extreme fatigue    Complete by:  As directed      Call MD for:  extreme fatigue    Complete by:  As directed      Call MD for:  extreme  fatigue    Complete by:  As directed      Call MD for:  hives    Complete by:  As directed      Call MD for:  hives    Complete by:  As directed      Call MD for:  hives    Complete by:  As directed      Call MD for:  persistant nausea and vomiting    Complete by:  As directed      Call MD for:  persistant nausea and vomiting    Complete by:  As directed      Call MD for:  persistant nausea and vomiting    Complete by:  As directed      Call MD for:  redness, tenderness, or signs of infection (pain, swelling, redness, odor or green/yellow discharge around incision site)    Complete by:  As directed      Call MD for:  redness, tenderness, or signs of infection (pain, swelling, redness, odor or green/yellow discharge around incision site)    Complete by:  As directed      Call MD for:  redness, tenderness, or signs of infection (pain, swelling, redness, odor or green/yellow discharge around incision site)    Complete by:  As directed      Call MD for:  severe uncontrolled pain    Complete by:  As directed      Call MD for:  severe uncontrolled pain    Complete by:  As directed      Call MD for:  severe uncontrolled pain    Complete by:  As directed      Call MD for:    Complete by:  As directed   Temperature > 101.44F     Call MD for:    Complete by:  As directed   Temperature > 101.44F     Call MD for:  Complete by:  As directed   Temperature > 101.49F     Diet - low sodium heart healthy    Complete by:  As directed      Diet - low sodium heart healthy    Complete by:  As directed      Discharge instructions    Complete by:  As directed   Please see discharge instruction sheets.  Also refer to handout given an office.  Please call our office if you have any questions or concerns [phone]     Discharge instructions    Complete by:  As directed   Please see discharge instruction sheets.  Also refer to handout given an office.  Please call our office if you have any  questions or concerns [phone]     Discharge instructions    Complete by:  As directed   Please see discharge instruction sheets.  Also refer to handout given an office.  Please call our office if you have any questions or concerns [phone]     Discharge wound care:    Complete by:  As directed   Please see drain care instructions.    Defer to interventional radiology that placed a drain.    In general, flush the drain at least once a day and wash around drain site and placed fresh dressing every day.     Discharge wound care:    Complete by:  As directed   If you have closed incisions, shower and bathe over these incisions with soap and water every day.  Remove all surgical dressings on postoperative day #3.  You do not need to replace dressings over the closed incisions unless you feel more comfortable with a Band-Aid covering it.   If you have an open wound that requires packing, please see wound care instructions.  In general, remove all dressings, wash wound with soap and water and then replace with saline moistened gauze.  Do the dressing change at least every day.  Please call our office [phone] if you have further questions.     Driving Restrictions    Complete by:  As directed   No driving until off narcotics and can safely swerve away without pain during an emergency     Driving Restrictions    Complete by:  As directed   No driving until off narcotics and can safely swerve away without pain during an emergency     Driving Restrictions    Complete by:  As directed   No driving until off narcotics and can safely swerve away without pain during an emergency     Increase activity slowly    Complete by:  As directed   Walk an hour a day.  Use 20-30 minute walks.  When you can walk 30 minutes without difficulty, it is fine to restart low impact/moderate activities such as biking, jogging, swimming, sexual activity, etc.  Eventually you can increase to unrestricted  activity when not feeling pain.  If you feel pain: STOP![place] Kitchen   Let pain protect you from overdoing it.  Use ice/heat & over-the-counter pain medications to help minimize soreness.  If that is not enough, then use your narcotic pain prescription as needed to remain active.  It is better to take extra pain medications and be more active than to stay bedridden to avoid all pain medications.     Increase activity slowly    Complete by:  As directed   Walk an hour a day.  Use 20-30 minute walks.  When you can walk 30 minutes without difficulty, it is fine to restart low impact/moderate activities such as biking, jogging, swimming, sexual activity, etc.  Eventually you can increase to unrestricted activity when not feeling pain.  If you feel pain: STOP!Marland Kitchen   Let pain protect you from overdoing it.  Use ice/heat & over-the-counter pain medications to help minimize soreness.  If that is not enough, then use your narcotic pain prescription as needed to remain active.  It is better to take extra pain medications and be more active than to stay bedridden to avoid all pain medications.     Increase activity slowly    Complete by:  As directed   Walk an hour a day.  Use 20-30 minute walks.  When you can walk 30 minutes without difficulty, it is fine to restart low impact/moderate activities such as biking, jogging, swimming, sexual activity, etc.  Eventually you can increase to unrestricted activity when not feeling pain.  If you feel pain: STOP!Marland Kitchen   Let pain protect you from overdoing it.  Use ice/heat & over-the-counter pain medications to help minimize soreness.  If that is not enough, then use your narcotic pain prescription as needed to remain active.  It is better to take extra pain medications and be more active than to stay bedridden to avoid all pain medications.     Lifting restrictions    Complete by:  As directed   Avoid heavy lifting initially.  Do not push through pain.  You have no specific weight limit - if it  hurts to do, DON'T DO IT.   If you feel no pain, you are not injuring anything.  Pain will protect you from injury.  Coughing and sneezing are far more stressful to your incision than any lifting.  Avoid resuming heavy lifting / intense activity until off all narcotic pain medications.  When ready to exercise more, give yourself 2 weeks to gradually get back to full intense exercise/activity.     Lifting restrictions    Complete by:  As directed   Avoid heavy lifting initially.  Do not push through pain.  You have no specific weight limit - if it hurts to do, DON'T DO IT.   If you feel no pain, you are not injuring anything.  Pain will protect you from injury.  Coughing and sneezing are far more stressful to your incision than any lifting.  Avoid resuming heavy lifting / intense activity until off all narcotic pain medications.  When ready to exercise more, give yourself 2 weeks to gradually get back to full intense exercise/activity.     Lifting restrictions    Complete by:  As directed   Avoid heavy lifting initially.  Do not push through pain.  You have no specific weight limit - if it hurts to do, DON'T DO IT.   If you feel no pain, you are not injuring anything.  Pain will protect you from injury.  Coughing and sneezing are far more stressful to your incision than any lifting.  Avoid resuming heavy lifting / intense activity until off all narcotic pain medications.  When ready to exercise more, give yourself 2 weeks to gradually get back to full intense exercise/activity.     May shower / Bathe    Complete by:  As directed      May shower / Bathe    Complete by:  As directed      May shower / Bathe  Complete by:  As directed      May walk up steps    Complete by:  As directed      May walk up steps    Complete by:  As directed      May walk up steps    Complete by:  As directed      Sexual Activity Restrictions    Complete by:  As directed   Sexual activity as tolerated.  Do not push  through pain.  Pain will protect you from injury.     Sexual Activity Restrictions    Complete by:  As directed   Sexual activity as tolerated.  Do not push through pain.  Pain will protect you from injury.     Sexual Activity Restrictions    Complete by:  As directed   Sexual activity as tolerated.  Do not push through pain.  Pain will protect you from injury.     Walk with assistance    Complete by:  As directed   Walk over an hour a day.  May use a walker/cane/companion to help with balance and stamina.     Walk with assistance    Complete by:  As directed   Walk over an hour a day.  May use a walker/cane/companion to help with balance and stamina.     Walk with assistance    Complete by:  As directed   Walk over an hour a day.  May use a walker/cane/companion to help with balance and stamina.            Medication List    STOP taking these medications        HYDROcodone-acetaminophen 10-325 MG tablet  Commonly known as:  NORCO      TAKE these medications        ciprofloxacin 500 MG tablet  Commonly known as:  CIPRO  Take 1 tablet (500 mg total) by mouth 2 (two) times daily.     clotrimazole 10 MG troche  Commonly known as:  MYCELEX  Take 10 mg by mouth 3 (three) times daily.     CoQ-10 100 MG Caps  Take 100 mg by mouth daily.     DULoxetine 60 MG capsule  Commonly known as:  CYMBALTA  Take 60 mg by mouth at bedtime.     esomeprazole 40 MG capsule  Commonly known as:  NEXIUM  Take 40 mg by mouth 2 (two) times daily.     furosemide 20 MG tablet  Commonly known as:  LASIX  Take 20 mg by mouth as needed for fluid or edema.     hydrocortisone 2.5 % cream  Apply 1 application topically as needed (rash).     HYDROmorphone 4 MG tablet  Commonly known as:  DILAUDID  Take 1-2 tablets (4-8 mg total) by mouth every 4 (four) hours as needed for moderate pain or severe pain.     hyoscyamine 0.375 MG 12 hr tablet  Commonly known as:  LEVBID  Take 0.375 mg by mouth  2 (two) times daily.     lidocaine-prilocaine cream  Commonly known as:  EMLA  Apply to affected area once     meloxicam 15 MG tablet  Commonly known as:  MOBIC  Take 15 mg by mouth daily.     methocarbamol 500 MG tablet  Commonly known as:  ROBAXIN  Take 500 mg by mouth 4 (four) times daily as needed.     metroNIDAZOLE 500 MG tablet  Commonly known as:  FLAGYL  Take 1 tablet (500 mg total) by mouth 3 (three) times daily.     nebivolol 10 MG tablet  Commonly known as:  BYSTOLIC  Take 10 mg by mouth at bedtime.     ondansetron 8 MG tablet  Commonly known as:  ZOFRAN  Take 1 tablet (8 mg total) by mouth 2 (two) times daily. Start the day after chemo for 2 days. Then take as needed for nausea or vomiting.     promethazine 25 MG tablet  Commonly known as:  PHENERGAN  Take 25 mg by mouth every 6 (six) hours as needed for nausea or vomiting.     rosuvastatin 5 MG tablet  Commonly known as:  CRESTOR  Take 5 mg by mouth at bedtime.     traMADol 50 MG tablet  Commonly known as:  ULTRAM  Take 50 mg by mouth every 4 (four) hours as needed. pain     Vitamin D (Ergocalciferol) 50000 UNITS Caps capsule  Commonly known as:  DRISDOL  Take 50,000 Units by mouth 2 (two) times a week.       Follow-up Information    Follow up with Elis Rawlinson C., MD. Schedule an appointment as soon as possible for a visit in 2 weeks.   Specialty:  General Surgery   Why:  To follow up after your operation, To follow up after your hospital stay   Contact information:   [place] [address] [phone]       Follow up with Truitt Merle, MD. Schedule an appointment as soon as possible for a visit in 2 weeks.   Specialties:  Hematology, Oncology   Why:  To follow up after your hospital stay   Contact information:   [place] [address] [phone]        Signed: Morton Peters, M.D., F.A.C.S. Gastrointestinal and Minimally  Invasive Surgery [facility] [address] [place], [place] [address] [phone] Main / Paging   06/29/2015, 8:01 AM

## 2015-07-02 ENCOUNTER — Telehealth: Payer: Self-pay | Admitting: Surgery

## 2015-07-02 NOTE — Telephone Encounter (Signed)
Ms. Rembold had a LAR on 11/10.2016 by Dr. Johney Maine.  She had a leak requiring a posterior perc drain on 06/24/2015.  The husband called because of changes in the drainage.  But the changes seem to me to be what would be expected.  She is also having some rectal spasm with BMs.  He does not have a follow up appt with Dr. Johney Maine yet, so I encouraged him to call tomorrow to make an appt for the next week or two.  Alphonsa Overall, MD, [facility] Pager: [phone] Office phone:  [phone]

## 2015-07-09 ENCOUNTER — Other Ambulatory Visit: Payer: Self-pay | Admitting: Hematology

## 2015-07-11 ENCOUNTER — Telehealth: Payer: Self-pay | Admitting: Hematology

## 2015-07-11 NOTE — Telephone Encounter (Signed)
Per 12/25 pof schedule lab/fu next 3 weeks. Added lab/fu for 1/19 1st available in 3 weeks. No mention of tx but added tx that is in care plan for week of 1/19 after f/u in case needed. Spoke with patient re appointment for 1/19.

## 2015-07-13 ENCOUNTER — Ambulatory Visit: Payer: BLUE CROSS/BLUE SHIELD

## 2015-07-19 MED FILL — HYDROmorphone HCL 4 MG TABS: 4 | 4 days supply | Qty: 50 | Fill #0

## 2015-07-20 ENCOUNTER — Ambulatory Visit
Admission: RE | Admit: 2015-07-20 | Discharge: 2015-07-20 | Disposition: A | Discharge status: Home / Self Care | Payer: BLUE CROSS/BLUE SHIELD | Source: Ambulatory Visit | Attending: Surgery | Admitting: Surgery

## 2015-07-20 ENCOUNTER — Other Ambulatory Visit: Payer: Self-pay | Admitting: Radiology

## 2015-07-20 ENCOUNTER — Other Ambulatory Visit (HOSPITAL_COMMUNITY): Payer: Self-pay | Admitting: Interventional Radiology

## 2015-07-20 ENCOUNTER — Ambulatory Visit
Admission: RE | Admit: 2015-07-20 | Discharge: 2015-07-20 | Disposition: A | Discharge status: Home / Self Care | Payer: BLUE CROSS/BLUE SHIELD | Source: Ambulatory Visit | Attending: Radiology | Admitting: Radiology

## 2015-07-20 ENCOUNTER — Other Ambulatory Visit: Payer: Self-pay | Admitting: Surgery

## 2015-07-20 DIAGNOSIS — N739 Female pelvic inflammatory disease, unspecified: Secondary | ICD-10-CM

## 2015-07-20 MED ORDER — IOPAMIDOL (ISOVUE-300) INJECTION 61%
125.0000 mL | Freq: Once | INTRAVENOUS | Status: AC | PRN
  Administered 2015-07-20: 125 mL via INTRAVENOUS

## 2015-07-20 NOTE — Progress Notes (Signed)
[name], female   DOB: September 04, 1965, 50 y.o.   MRN: [id number]    Chief Complaint: Patient was seen in consultation today for No chief complaint on file.  at the request of Allred,Darrell K  Referring Physician(s): Allred,Darrell K  History of Present Illness: Lindsay Little is a 50 y.o. female who had a rectosigmoid abscess drain treated 06/24/2015 with a right transgluteal abscess drain. She has been flushing this once a day and outputs have ranged from 5-20 mL per day. Her course of antibiotics finished 3 days ago. Since then, she has had low-grade fevers but no increasing pelvic pain. Overall, she is feeling well and has no significant complaints.  Past Medical History  Diagnosis Date  . Hypertension   . Hypercholesteremia   . Obesity   . Tear of left rotator cuff   . IBS (irritable bowel syndrome)   . Vertigo   . TMJ (dislocation of temporomandibular joint)   . Bulging disc     L5  . Depression   . Rectal bleeding   . Vitamin D deficiency   . Osteoarthritis, knee   . S/P radiation therapy 02/20/15-03/30/15    colon/rectal  . Complication of anesthesia   . PONV (postoperative nausea and vomiting)   . History of mitral valve prolapse   . Mild sleep apnea     does not use CPAP  . History of chemotherapy   . Numbness and tingling     great toe bilat left more than right   . History of frequent ear infections   . History of frequent urinary tract infections     middle school age  . GERD (gastroesophageal reflux disease)   . Cancer ([place])   . Anemia   . History of blood transfusion   . Fibroids   . Cystadenoma of right ovary s/p RSO 05/25/2015 06/28/2015    Past Surgical History  Procedure Laterality Date  . Rotator cuff repair    . Eus N/A 01/18/2015    Procedure: LOWER ENDOSCOPIC ULTRASOUND (EUS);  Surgeon: Arta Silence, MD;  Location: [facility];  Service: Endoscopy;  Laterality: N/A;  . Abdominal hysterectomy  1996    Removed uterus, and  tubes  . Left knee arthroscopy      mult  . Tonsillectomy    . Dilation and curettage of uterus      times 2  . Xi robotic assisted lower anterior resection N/A 05/25/2015    Procedure: XI ROBOTIC ASSISTED LOWER ANTERIOR RESECTION, , RIGID PROCTOSCOPY, RIGHT OOPHORECTOMY;  Surgeon: Michael Boston, MD;  Location: [facility];  Service: General;  Laterality: N/A;  . Portacath placement N/A 05/25/2015    Procedure: INSERTION PORT-A-CATH;  Surgeon: Michael Boston, MD;  Location: [facility];  Service: General;  Laterality: N/A;    Allergies: XBLTJ-0; Oxycodone; Penicillins; and Sulfa antibiotics  Medications: Prior to Admission medications   Medication Sig Start Date End Date Taking? Authorizing Provider  ciprofloxacin (CIPRO) 500 MG tablet Take 1 tablet (500 mg total) by mouth 2 (two) times daily. 06/28/15   Michael Boston, MD  clotrimazole (MYCELEX) 10 MG troche Take 10 mg by mouth 3 (three) times daily.  06/13/15   Historical Provider, MD  Coenzyme Q10 (COQ-10) 100 MG CAPS Take 100 mg by mouth daily.    Historical Provider, MD  DULoxetine (CYMBALTA) 60 MG capsule Take 60 mg by mouth at bedtime.     Historical Provider, MD  esomeprazole (NEXIUM) 40 MG capsule Take 40 mg by  mouth 2 (two) times daily.    Historical Provider, MD  furosemide (LASIX) 20 MG tablet Take 20 mg by mouth as needed for fluid or edema.     Historical Provider, MD  hydrocortisone 2.5 % cream Apply 1 application topically as needed (rash).     Historical Provider, MD  HYDROmorphone (DILAUDID) 4 MG tablet Take 1-2 tablets (4-8 mg total) by mouth every 4 (four) hours as needed for moderate pain or severe pain. 06/28/15   Michael Boston, MD  hyoscyamine (LEVBID) 0.375 MG 12 hr tablet Take 0.375 mg by mouth 2 (two) times daily.    Historical Provider, MD  lidocaine-prilocaine (EMLA) cream Apply to affected area once Patient not taking: Reported on 06/23/2015 06/14/15   Truitt Merle, MD  meloxicam (MOBIC) 15 MG tablet Take 15 mg by mouth daily.     Historical Provider, MD  methocarbamol (ROBAXIN) 500 MG tablet Take 500 mg by mouth 4 (four) times daily as needed. 06/06/15   Historical Provider, MD  metroNIDAZOLE (FLAGYL) 500 MG tablet Take 1 tablet (500 mg total) by mouth 3 (three) times daily. 06/28/15   Michael Boston, MD  nebivolol (BYSTOLIC) 10 MG tablet Take 10 mg by mouth at bedtime.     Historical Provider, MD  ondansetron (ZOFRAN) 8 MG tablet Take 1 tablet (8 mg total) by mouth 2 (two) times daily. Start the day after chemo for 2 days. Then take as needed for nausea or vomiting. Patient not taking: Reported on 06/23/2015 06/14/15   Truitt Merle, MD  promethazine (PHENERGAN) 25 MG tablet Take 25 mg by mouth every 6 (six) hours as needed for nausea or vomiting.    Historical Provider, MD  rosuvastatin (CRESTOR) 5 MG tablet Take 5 mg by mouth at bedtime.     Historical Provider, MD  traMADol (ULTRAM) 50 MG tablet Take 50 mg by mouth every 4 (four) hours as needed. pain 06/13/15   Historical Provider, MD  Vitamin D, Ergocalciferol, (DRISDOL) 50000 UNITS CAPS capsule Take 50,000 Units by mouth 2 (two) times a week.    Historical Provider, MD     Family History  Problem Relation Age of Onset  . Coronary artery disease Mother 53  . Hypertension Mother   . Hyperlipidemia Mother   . Diabetes Mellitus I Mother   . Coronary artery disease Father   . Hyperlipidemia Father   . Hypertension Father   . Cancer Sister     skin - non melanoma  . Hyperlipidemia Brother   . Cancer Maternal Uncle 61    pancreatic with mets to colon and prostate  . Cirrhosis Maternal Uncle   . Hypertension Maternal Grandmother   . Diabetes Mellitus I Maternal Grandmother   . Hyperlipidemia Maternal Grandmother   . CVA Maternal Grandmother   . Hypertension Maternal Grandfather   . Coronary artery disease Maternal Grandfather 65  . Hyperlipidemia Maternal Grandfather   . Coronary artery disease Paternal Grandmother   . Hypertension Paternal Grandmother   .  Hyperlipidemia Paternal Grandmother   . Diabetes Mellitus I Paternal Grandmother   . Hypertension Paternal Grandfather   . Hyperlipidemia Paternal Grandfather   . Coronary artery disease Paternal Grandfather     Social History   Social History  . Marital Status: Married    Spouse Name: N/A  . Number of Children: N/A  . Years of Education: N/A   Social History Main Topics  . Smoking status: Former Smoker -- 0.50 packs/day for 4 years    Types: Cigarettes  Quit date: 07/15/2000  . Smokeless tobacco: Never Used     Comment: 1 pack/week intermittently x 7 years  . Alcohol Use: 0.6 oz/week    1 Shots of liquor per week     Comment: rarely   . Drug Use: No  . Sexual Activity: Not on file   Other Topics Concern  . Not on file   Social History Narrative   Tobacco Use: Cigarettes - Former Smoker   Alcohol: Yes, very rare, liquor.    No recreational drug use   Occupation: Head CMA @ [employer]   Marital Status: Married    Husband: Roselyn Reef Disabled   Children: 2 adopted kids [place]   Religion: [employer] [place]: A 12 point ROS discussed and pertinent positives are indicated in the HPI above.  All other systems are negative.  Review of Systems  Vital Signs: BP 139/66 mmHg  Pulse 85  Temp(Src) 98.9 F (37.2 C) (Oral)  SpO2 94%  Physical Exam  Constitutional: She is oriented to person, place, and time. She appears well-developed and well-nourished.  Abdominal:  The right transgluteal abscess drain site is clean and dry without erythema or discharge. The drain is well positioned.  Neurological: She is alert and oriented to person, place, and time.    Mallampati Score:     Imaging: Dg Pelvis 1-2 Views  06/24/2015  CLINICAL DATA:  Pain EXAM: PELVIS - 1-2 VIEW COMPARISON:  None. FINDINGS: There is no evidence of pelvic fracture or dislocation. Joint spaces appear intact. Visualized bowel gas pattern  is normal. There are phleboliths in the pelvis. IMPRESSION: No bony abnormality.  Visualized bowel gas pattern unremarkable. Electronically Signed   By: Lowella Grip III M.D.   On: 06/24/2015 09:42   Ct Abdomen Pelvis W Contrast  07/20/2015  CLINICAL DATA:  Followup abscess EXAM: CT ABDOMEN AND PELVIS WITH CONTRAST TECHNIQUE: Multidetector CT imaging of the abdomen and pelvis was performed using the standard protocol following bolus administration of intravenous contrast. CONTRAST:  125 cc Isovue 300 COMPARISON:  06/24/2015 FINDINGS: There is a right trans gluteal drain well positioned in the presacral abscess. The abscess cavity has diminished in size compared to the drain placement scout imaging dated 06/24/2015. Today, it measures 5.0 x 6.1 cm and previously measured 6.7 x 4.9 cm based on my personal measurements. There is no new pelvic abscess. Stable liver lesions. Spleen, pancreas, adrenal glands, and kidneys are within normal limits. Unremarkable gallbladder There is no free fluid. There is no abnormal retroperitoneal adenopathy by measurement criteria. A small gas bubble adjacent to the sigmoid colon on image 65 is felt to be related to a diverticulum. Bladder is decompressed. Uterus is absent. Adnexa are within normal limits. No vertebral compression deformity. Anterolisthesis L4 upon L5 with degenerative disc disease is stable. IMPRESSION: Right trans gluteal pelvic abscess strain has been placed. The abscess cavity is smaller compared with images from 06/24/2015. Electronically Signed   By: Marybelle Killings M.D.   On: 07/20/2015 11:34   Ct Abdomen Pelvis W Contrast  06/23/2015  CLINICAL DATA:  Rectosigmoid colon resection for rectal cancer on 05/25/2015. Rectal pain and bleeding for 2 weeks. Assess for possible abscess. EXAM: CT ABDOMEN AND PELVIS WITH CONTRAST TECHNIQUE: Multidetector CT imaging of the abdomen and pelvis was performed using the standard protocol following bolus administration of  intravenous  contrast. CONTRAST:  129m ISOVUE-300 IOPAMIDOL (ISOVUE-300) INJECTION 61% COMPARISON:  MR abdomen 02/05/2015.  CT abdomen 01/20/2015 FINDINGS: Lung bases are clear. No pleural or pericardial fluid. Known hemangiomas are again demonstrated in the liver, unchanged from the previous studies. No evidence of hepatic metastatic disease. No calcified gallstones. The spleen is normal. The pancreas is normal. The adrenal glands are normal. The kidneys are normal. The aorta and IVC are normal. No retroperitoneal mass or lymphadenopathy in the region of the abdomen. No acute bowel pathology in the region of the abdomen. In the pelvis, the patient has had previous hysterectomy. There has been recent rectosigmoid surgery with loss of the fat plane between the rectosigmoid colon in the sacrum/coccyx. There is stool material within the rectum. It appears that there is a direct reanastomosis. There is an unusual air cavity in that region raising the possibility of dehiscence at the anastomosis with some contained air/gas. I cannot be completely sure that there is not an extraluminal abscess, and am suspicious of that. It may be necessary to perform a water soluble contrast study to completely understand this anatomy. IMPRESSION: Postoperative changes at the rectosigmoid region. Worrisome gas collection with unusual characteristics that suggests the presence of dehiscence of the anastomosis with a contained rupture. Possible abscess in that region, as it is difficult to define the exact margins of the lumen of the rectosigmoid. I am in the process of calling these results. Electronically Signed   By: MNelson ChimesM.D.   On: 06/23/2015 14:53   Ir Catheter Tube Change  06/28/2015  CLINICAL DATA:  50year old female with a history of rectal adenocarcinoma status post low anterior resection 05/25/2015. Postoperative course was complicated by anastomotic leak and development of perirectal presacral abscess. A trans  gluteal 13F drainage catheter was placed on 06/24/2015. The drainage catheter output is minimal. Patient presents for reposition and up size. EXAM: IR CATHETER TUBE CHANGE Date: 06/28/2015 PROCEDURE: 1. Contrast injection through existing catheter 2. Reposition and upsize of drainage catheter Interventional Radiologist:  HCriselda Peaches MD ANESTHESIA/SEDATION: Moderate (conscious) sedation was used. 8 mg Versed, 100 mcg Fentanyl were administered intravenously. The patient's vital signs were monitored continuously by radiology nursing throughout the procedure. Sedation Time: 26 minutes MEDICATIONS: None additional FLUOROSCOPY TIME:  2 minutes 24 seconds for a total of 178 mGy CONTRAST:  10 mL Omnipaque 300 TECHNIQUE: Informed consent was obtained from the patient following explanation of the procedure, risks, benefits and alternatives. The patient understands, agrees and consents for the procedure. All questions were addressed. A time out was performed. Maximal barrier sterile technique utilized including caps, mask, sterile gowns, sterile gloves, large sterile drape, hand hygiene, and Betadine skin prep. An initial fluoroscopic spot image demonstrates an incompletely formed catheter along the right pelvic sidewall. Contrast injection confirms that the catheter is in communication with the flocculent abscess cavity. Additionally, contrast material passes through the rectal anastomosis and out the anus during injection. The tube was transected and removed over a wire. Using an angled catheter, the wire was carefully advanced more medially and superiorly into the abscess collection. The skin tract was then serially dilated to 14 [place] a [place] 14 [place]-purpose drainage catheter modified with additional sideholes was advanced into the abscess collection and formed. There was immediate return of purely feculent material. The cavity was lavaged several times with saline and additional material and debris  aspirated. COMPLICATIONS: None IMPRESSION: 1. Contrast injection confirms residual abscess cavity containing flocculent material and debris. 2. Successful  repositioning of the drainage catheter more medially and superiorly as well as upsized to 14 [place]. 3. Aspiration and lavage of the abscess cavity yields purely feculent material and debris. Electronically Signed   By: Jacqulynn Cadet M.D.   On: 06/28/2015 12:47   Dg Abd Portable 2v  06/24/2015  CLINICAL DATA:  Abdominal pain and diarrhea.  Colon can't EXAM: PORTABLE ABDOMEN - 2 VIEW COMPARISON:  CT 06/23/2015 FINDINGS: No dilated loops of large or small bowel. There is gas and stool throughout the colon and rectosigmoid colon. Surgical clips noted in the lower pelvis. No intraperitoneal free air. IMPRESSION: No evidence of bowel obstruction or intraperitoneal free air. Electronically Signed   By: Suzy Bouchard M.D.   On: 06/24/2015 09:20   Ct Image Guide Drain Transvag Transrect Peritoneal Retroper  06/26/2015  ADDENDUM REPORT: 06/26/2015 15:45 ADDENDUM: The local anesthetic utilized in the above procedure was bupivacaine given patient's history of lidocaine allergy. The patient tolerated this local anesthetic without adverse complication. Electronically Signed   By: Sandi Mariscal M.D.   On: 06/26/2015 15:45  06/26/2015  INDICATION: History of colon cancer, post robotic assisted low anterior resection, now with pelvic pain and fever with CT findings worrisome for perianastomotic leak. Please perform CT-guided percutaneous drainage catheter placement. EXAM: CT IMAGE GUIDE DRAIN TRANSVAG TRANSRECT PERITONEAL RETROPER COMPARISON:  CT abdomen pelvis - 06/23/2015; 01/20/2015 MEDICATIONS: The patient is currently admitted to the hospital and receiving intravenous antibiotics. The antibiotics were administered within an appropriate time frame prior to the initiation of the procedure. ANESTHESIA/SEDATION: Fentanyl 100 mcg IV; Versed 6 mg IV Total Moderate  Sedation time 19 minutes CONTRAST:  None COMPLICATIONS: None immediate PROCEDURE: Informed written consent was obtained from the patient after a discussion of the risks, benefits and alternatives to treatment. The patient was placed prone on the CT gantry and a pre procedural CT was performed re-demonstrating the known abscess/fluid collection within the presacral space with dominant component measuring approximately 7.2 x 4.5 cm (image 19, series 3). The procedure was planned. A timeout was performed prior to the initiation of the procedure. The skin overlying the medial aspect the right buttocks was prepped and draped in the usual sterile fashion. The overlying soft tissues were anesthetized with 1% lidocaine with epinephrine. Appropriate trajectory was planned with the use of a 22 gauge spinal needle. An 18 gauge trocar needle was advanced into the abscess/fluid collection and a short Amplatz super stiff wire was coiled within the collection. Appropriate positioning was confirmed with a limited CT scan. The tract was serially dilated allowing placement of a 10 [place] all-purpose drainage catheter. Appropriate positioning was confirmed with a limited postprocedural CT scan. Approximately 20 cc of feculent material was aspirated. Given the feculent appearance of the aspirated fluid, the drainage catheter was connected to a JP bulb. The drainage catheter was sutured in place. A dressing was placed. The patient tolerated the procedure well without immediate post procedural complication. IMPRESSION: Successful CT guided placement of a 10 French all purpose drain catheter into the pelvis via right trans gluteal approach with aspiration of 20 mL of purulent fluid. Samples were sent to the laboratory as requested by the ordering clinical team. Electronically Signed: By: Sandi Mariscal M.D. On: 06/24/2015 13:06    Labs:  CBC:  Recent Labs  06/23/15 2000 06/24/15 0630 06/26/15 0001 06/28/15 1000  WBC 8.2 6.3 5.9  6.3  HGB 7.4* 6.5* 6.7* 6.7*  HCT 24.7* 21.5* 22.4* 22.5*  PLT 421* 362 389 396  COAGS:  Recent Labs  06/24/15 0630  INR 1.22    BMP:  Recent Labs  05/18/15 1450 05/26/15 0552 06/14/15 0829 06/23/15 2000 06/24/15 0630 06/26/15 0001  NA 140 137 141  --  132* 139  K 4.0 4.3 4.3  --  3.2* 3.7  CL 107 106  --   --  99* 103  CO2 27 27 27   --  25 26  GLUCOSE 144* 115* 99  --  94 103*  BUN 14 10 11.1  --  8 8  CALCIUM 9.7 9.1 10.2  --  8.8* 9.0  CREATININE 0.68 0.60 0.7 0.62 0.59 0.48  GFRNONAA >60 >60  --  >60 >60 >60  GFRAA >60 >60  --  >60 >60 >60    LIVER FUNCTION TESTS:  Recent Labs  03/21/15 0929 03/27/15 0956 04/18/15 0900 06/14/15 0829  BILITOT 0.41 0.31 0.33 <0.30  AST 11 14 10 10   ALT 16 16 13 12   ALKPHOS 62 64 72 78  PROT 6.8 6.6 7.0 7.5  ALBUMIN 3.7 3.6 3.7 2.7*    TUMOR MARKERS:  Recent Labs  02/20/15 0949 04/18/15 0900  CEA <0.5 0.9    Assessment and Plan:  An abscess drain is positioned within the presacral cavity. The cavity is slightly smaller. There is no new abscess. Fluoroscopic contrast injection is not indicated at this time. The patient was instructed to resume antibiotics with her refill. The course of antibiotics will be 2 weeks. She will follow-up in 2 weeks with a follow-up CT scan.    Signed: Julen Rubert, ART A 07/20/2015, 12:12 PM   I spent a total of   15 Minutes in face to face in clinical consultation, greater than 50% of which was counseling/coordinating care for pelvic abscess.

## 2015-08-02 ENCOUNTER — Ambulatory Visit
Admission: RE | Admit: 2015-08-02 | Discharge: 2015-08-02 | Disposition: A | Discharge status: Home / Self Care | Payer: No Typology Code available for payment source | Source: Ambulatory Visit | Attending: Interventional Radiology | Admitting: Interventional Radiology

## 2015-08-02 ENCOUNTER — Ambulatory Visit
Admission: RE | Admit: 2015-08-02 | Discharge: 2015-08-02 | Disposition: A | Discharge status: Home / Self Care | Payer: BLUE CROSS/BLUE SHIELD | Source: Ambulatory Visit | Attending: Surgery | Admitting: Surgery

## 2015-08-02 DIAGNOSIS — N739 Female pelvic inflammatory disease, unspecified: Secondary | ICD-10-CM

## 2015-08-02 MED ORDER — IOPAMIDOL (ISOVUE-300) INJECTION 61%
125.0000 mL | Freq: Once | INTRAVENOUS | Status: AC | PRN
  Administered 2015-08-02: 125 mL via INTRAVENOUS

## 2015-08-02 NOTE — Progress Notes (Signed)
Chief Complaint: Patient was seen in consultation today for No chief complaint on file.  at the request of Hassell,Daniel  Referring Physician(s): Hassell,Daniel  History of Present Illness: Lindsay Little is a 50 y.o. female who underwent a robotic assisted low anterior resection with anastamosis for bulky proximal/mid rectal cancer on 05/25/2015.  She was admitted to the hospital on 06/23/2015 for increasing pelvic pain and pressure.  CT scan revealed postoperative changes at the rectosigmoid region. Worrisome gas collection with unusual characteristics that suggests the presence of dehiscence of the anastomosis with a contained rupture. Possible abscess in that region, as it is difficult to define the exact margins of the lumen of the rectosigmoid.  She underwent a successful CT guided placement of a 10 French all purpose drain catheter into the pelvis via right trans gluteal approach by Dr. Pascal Lux on 06/26/2015 and the she had it up sized by Dr. Laurence Ferrari due to minimal output on 06/28/2015.  She was seen in clinic on 07/19/2014 by Rowe Robert and Dr. Barbie Banner. At that time, the cavity was slightly smaller and drain injection was NOT indicated at that time. She was instructed to continue antibiotics for 2 more weeks and follow up here again in 2 weeks with repeat CT scan.  She is here today for repeat CT scan and possible drain injection.  She does report a fever last night, but is afebrile today in our office.  She reports about 10 mls of brown fecal drainage daily for about the past 7 days.   Past Medical History  Diagnosis Date  . Hypertension   . Hypercholesteremia   . Obesity   . Tear of left rotator cuff   . IBS (irritable bowel syndrome)   . Vertigo   . TMJ (dislocation of temporomandibular joint)   . Bulging disc     L5  . Depression   . Rectal bleeding   . Vitamin D deficiency   . Osteoarthritis, knee   . S/P radiation therapy 02/20/15-03/30/15   colon/rectal  . Complication of anesthesia   . PONV (postoperative nausea and vomiting)   . History of mitral valve prolapse   . Mild sleep apnea     does not use CPAP  . History of chemotherapy   . Numbness and tingling     great toe bilat left more than right   . History of frequent ear infections   . History of frequent urinary tract infections     middle school age  . GERD (gastroesophageal reflux disease)   . Cancer ([place])   . Anemia   . History of blood transfusion   . Fibroids   . Cystadenoma of right ovary s/p RSO 05/25/2015 06/28/2015    Past Surgical History  Procedure Laterality Date  . Rotator cuff repair    . Eus N/A 01/18/2015    Procedure: LOWER ENDOSCOPIC ULTRASOUND (EUS);  Surgeon: Arta Silence, MD;  Location: [facility];  Service: Endoscopy;  Laterality: N/A;  . Abdominal hysterectomy  1996    Removed uterus, and tubes  . Left knee arthroscopy      mult  . Tonsillectomy    . Dilation and curettage of uterus      times 2  . Xi robotic assisted lower anterior resection N/A 05/25/2015    Procedure: XI ROBOTIC ASSISTED LOWER ANTERIOR RESECTION, , RIGID PROCTOSCOPY, RIGHT OOPHORECTOMY;  Surgeon: Michael Boston, MD;  Location: [facility];  Service: General;  Laterality: N/A;  . Portacath placement N/A 05/25/2015  Procedure: INSERTION PORT-A-CATH;  Surgeon: Michael Boston, MD;  Location: [facility];  Service: General;  Laterality: N/A;    Allergies: AUQJF-3; Oxycodone; Penicillins; and Sulfa antibiotics  Medications: Prior to Admission medications   Medication Sig Start Date End Date Taking? Authorizing Provider  ciprofloxacin (CIPRO) 500 MG tablet Take 1 tablet (500 mg total) by mouth 2 (two) times daily. 06/28/15   Michael Boston, MD  clotrimazole (MYCELEX) 10 MG troche Take 10 mg by mouth 3 (three) times daily.  06/13/15   Historical Provider, MD  Coenzyme Q10 (COQ-10) 100 MG CAPS Take 100 mg by mouth daily.    Historical Provider, MD  DULoxetine (CYMBALTA) 60 MG  capsule Take 60 mg by mouth at bedtime.     Historical Provider, MD  esomeprazole (NEXIUM) 40 MG capsule Take 40 mg by mouth 2 (two) times daily.    Historical Provider, MD  furosemide (LASIX) 20 MG tablet Take 20 mg by mouth as needed for fluid or edema.     Historical Provider, MD  hydrocortisone 2.5 % cream Apply 1 application topically as needed (rash).     Historical Provider, MD  HYDROmorphone (DILAUDID) 4 MG tablet Take 1-2 tablets (4-8 mg total) by mouth every 4 (four) hours as needed for moderate pain or severe pain. 06/28/15   Michael Boston, MD  hyoscyamine (LEVBID) 0.375 MG 12 hr tablet Take 0.375 mg by mouth 2 (two) times daily.    Historical Provider, MD  lidocaine-prilocaine (EMLA) cream Apply to affected area once Patient not taking: Reported on 06/23/2015 06/14/15   Truitt Merle, MD  meloxicam (MOBIC) 15 MG tablet Take 15 mg by mouth daily.    Historical Provider, MD  methocarbamol (ROBAXIN) 500 MG tablet Take 500 mg by mouth 4 (four) times daily as needed. 06/06/15   Historical Provider, MD  metroNIDAZOLE (FLAGYL) 500 MG tablet Take 1 tablet (500 mg total) by mouth 3 (three) times daily. 06/28/15   Michael Boston, MD  nebivolol (BYSTOLIC) 10 MG tablet Take 10 mg by mouth at bedtime.     Historical Provider, MD  ondansetron (ZOFRAN) 8 MG tablet Take 1 tablet (8 mg total) by mouth 2 (two) times daily. Start the day after chemo for 2 days. Then take as needed for nausea or vomiting. Patient not taking: Reported on 06/23/2015 06/14/15   Truitt Merle, MD  promethazine (PHENERGAN) 25 MG tablet Take 25 mg by mouth every 6 (six) hours as needed for nausea or vomiting.    Historical Provider, MD  rosuvastatin (CRESTOR) 5 MG tablet Take 5 mg by mouth at bedtime.     Historical Provider, MD  traMADol (ULTRAM) 50 MG tablet Take 50 mg by mouth every 4 (four) hours as needed. pain 06/13/15   Historical Provider, MD  Vitamin D, Ergocalciferol, (DRISDOL) 50000 UNITS CAPS capsule Take 50,000 Units by mouth 2  (two) times a week.    Historical Provider, MD     Family History  Problem Relation Age of Onset  . Coronary artery disease Mother 82  . Hypertension Mother   . Hyperlipidemia Mother   . Diabetes Mellitus I Mother   . Coronary artery disease Father   . Hyperlipidemia Father   . Hypertension Father   . Cancer Sister     skin - non melanoma  . Hyperlipidemia Brother   . Cancer Maternal Uncle 46    pancreatic with mets to colon and prostate  . Cirrhosis Maternal Uncle   . Hypertension Maternal Grandmother   . Diabetes  Mellitus I Maternal Grandmother   . Hyperlipidemia Maternal Grandmother   . CVA Maternal Grandmother   . Hypertension Maternal Grandfather   . Coronary artery disease Maternal Grandfather 65  . Hyperlipidemia Maternal Grandfather   . Coronary artery disease Paternal Grandmother   . Hypertension Paternal Grandmother   . Hyperlipidemia Paternal Grandmother   . Diabetes Mellitus I Paternal Grandmother   . Hypertension Paternal Grandfather   . Hyperlipidemia Paternal Grandfather   . Coronary artery disease Paternal Grandfather     Social History   Social History  . Marital Status: Married    Spouse Name: N/A  . Number of Children: N/A  . Years of Education: N/A   Social History Main Topics  . Smoking status: Former Smoker -- 0.50 packs/day for 4 years    Types: Cigarettes    Quit date: 07/15/2000  . Smokeless tobacco: Never Used     Comment: 1 pack/week intermittently x 7 years  . Alcohol Use: 0.6 oz/week    1 Shots of liquor per week     Comment: rarely   . Drug Use: No  . Sexual Activity: Not on file   Other Topics Concern  . Not on file   Social History Narrative   Tobacco Use: Cigarettes - Former Smoker   Alcohol: Yes, very rare, liquor.    No recreational drug use   Occupation: Head CMA @ [employer]   Marital Status: Married    Husband: Roselyn Reef Disabled   Children: 2 adopted kids [place]   Religion: [employer] in [place]                    Review of Systems Constitutional: Positive for fever, activity change and appetite change. Negative for chills.  HENT: Negative.  Respiratory: Negative for cough and shortness of breath.  Cardiovascular: Negative for chest pain.  Gastrointestinal: Negative.  Genitourinary: Positive for pelvic pain.  Musculoskeletal: Negative.  Neurological: Negative.  Psychiatric/Behavioral: Negative.  Vital Signs: BP 140/78 mmHg  Pulse 102  Temp(Src) 98.6 F (37 C) (Oral)  SpO2 97%  Physical Exam Constitutional:  Obese, NAD  Heart tachy today, 102 = patient reports she is nervous Transgluteal drain in place. No erythema. Drain injection done today. (See Dr. Adron Bene report for details)  Imaging: Ct Abdomen Pelvis W Contrast  07/20/2015  CLINICAL DATA:  Followup abscess EXAM: CT ABDOMEN AND PELVIS WITH CONTRAST TECHNIQUE: Multidetector CT imaging of the abdomen and pelvis was performed using the standard protocol following bolus administration of intravenous contrast. CONTRAST:  125 cc Isovue 300 COMPARISON:  06/24/2015 FINDINGS: There is a right trans gluteal drain well positioned in the presacral abscess. The abscess cavity has diminished in size compared to the drain placement scout imaging dated 06/24/2015. Today, it measures 5.0 x 6.1 cm and previously measured 6.7 x 4.9 cm based on my personal measurements. There is no new pelvic abscess. Stable liver lesions. Spleen, pancreas, adrenal glands, and kidneys are within normal limits. Unremarkable gallbladder There is no free fluid. There is no abnormal retroperitoneal adenopathy by measurement criteria. A small gas bubble adjacent to the sigmoid colon on image 65 is felt to be related to a diverticulum. Bladder is decompressed. Uterus is absent. Adnexa are within normal limits. No vertebral compression deformity. Anterolisthesis L4 upon L5 with degenerative disc disease is stable. IMPRESSION: Right trans gluteal pelvic  abscess strain has been placed. The abscess cavity is smaller compared with images from 06/24/2015. Electronically Signed   By: Arnell Sieving  Hoss M.D.   On: 07/20/2015 11:34    Labs:  CBC:  Recent Labs  06/23/15 2000 06/24/15 0630 06/26/15 0001 06/28/15 1000  WBC 8.2 6.3 5.9 6.3  HGB 7.4* 6.5* 6.7* 6.7*  HCT 24.7* 21.5* 22.4* 22.5*  PLT 421* 362 389 396    COAGS:  Recent Labs  06/24/15 0630  INR 1.22    BMP:  Recent Labs  05/18/15 1450 05/26/15 0552 06/14/15 0829 06/23/15 2000 06/24/15 0630 06/26/15 0001  NA 140 137 141  --  132* 139  K 4.0 4.3 4.3  --  3.2* 3.7  CL 107 106  --   --  99* 103  CO2 27 27 27   --  25 26  GLUCOSE 144* 115* 99  --  94 103*  BUN 14 10 11.1  --  8 8  CALCIUM 9.7 9.1 10.2  --  8.8* 9.0  CREATININE 0.68 0.60 0.7 0.62 0.59 0.48  GFRNONAA >60 >60  --  >60 >60 >60  GFRAA >60 >60  --  >60 >60 >60    LIVER FUNCTION TESTS:  Recent Labs  03/21/15 0929 03/27/15 0956 04/18/15 0900 06/14/15 0829  BILITOT 0.41 0.31 0.33 <0.30  AST 11 14 10 10   ALT 16 16 13 12   ALKPHOS 62 64 72 78  PROT 6.8 6.6 7.0 7.5  ALBUMIN 3.7 3.6 3.7 2.7*    TUMOR MARKERS:  Recent Labs  02/20/15 0949 04/18/15 0900  CEA <0.5 0.9    Assessment:  S/P Low anterior resection with anastamosis by Dr. Johney Maine 05/25/15  Trans gluteal perc drain by Dr. Pascal Lux 06/26/15, up sized by Dr. Laurence Ferrari 06/28/2015  CT in clinic 07/20/2015.  CT today and drain injection today. Drain should remain in place for now. Continue drain care.  Recommend F/U with Dr. Johney Maine.  Electronically Signed: Murrell Redden PA-C 08/02/2015, 11:16 AM   Please refer to Dr. Adron Bene attestation of this note for management and plan.

## 2015-08-03 ENCOUNTER — Ambulatory Visit: Payer: Self-pay

## 2015-08-03 ENCOUNTER — Other Ambulatory Visit (HOSPITAL_BASED_OUTPATIENT_CLINIC_OR_DEPARTMENT_OTHER): Payer: BLUE CROSS/BLUE SHIELD

## 2015-08-03 ENCOUNTER — Ambulatory Visit (HOSPITAL_BASED_OUTPATIENT_CLINIC_OR_DEPARTMENT_OTHER): Payer: BLUE CROSS/BLUE SHIELD

## 2015-08-03 ENCOUNTER — Ambulatory Visit (HOSPITAL_BASED_OUTPATIENT_CLINIC_OR_DEPARTMENT_OTHER): Payer: BLUE CROSS/BLUE SHIELD | Admitting: Hematology

## 2015-08-03 ENCOUNTER — Telehealth: Payer: Self-pay | Admitting: Hematology

## 2015-08-03 ENCOUNTER — Encounter: Payer: Self-pay | Admitting: Hematology

## 2015-08-03 VITALS — BP 150/86 | HR 122 | Temp 99.5°F | Resp 18 | Ht 70.5 in | Wt 300.7 lb

## 2015-08-03 DIAGNOSIS — C20 Malignant neoplasm of rectum: Secondary | ICD-10-CM

## 2015-08-03 DIAGNOSIS — D500 Iron deficiency anemia secondary to blood loss (chronic): Secondary | ICD-10-CM | POA: Diagnosis not present

## 2015-08-03 DIAGNOSIS — F329 Major depressive disorder, single episode, unspecified: Secondary | ICD-10-CM

## 2015-08-03 DIAGNOSIS — R509 Fever, unspecified: Secondary | ICD-10-CM

## 2015-08-03 DIAGNOSIS — D638 Anemia in other chronic diseases classified elsewhere: Secondary | ICD-10-CM

## 2015-08-03 DIAGNOSIS — K922 Gastrointestinal hemorrhage, unspecified: Secondary | ICD-10-CM

## 2015-08-03 DIAGNOSIS — N739 Female pelvic inflammatory disease, unspecified: Secondary | ICD-10-CM

## 2015-08-03 DIAGNOSIS — R109 Unspecified abdominal pain: Secondary | ICD-10-CM

## 2015-08-03 DIAGNOSIS — Z95828 Presence of other vascular implants and grafts: Secondary | ICD-10-CM

## 2015-08-03 DIAGNOSIS — I10 Essential (primary) hypertension: Secondary | ICD-10-CM

## 2015-08-03 DIAGNOSIS — R630 Anorexia: Secondary | ICD-10-CM

## 2015-08-03 LAB — CBC WITH DIFFERENTIAL/PLATELET
BASO%: 0.7 % (ref 0.0–2.0)
Basophils Absolute: 0 10*3/uL (ref 0.0–0.1)
EOS%: 3.3 % (ref 0.0–7.0)
Eosinophils Absolute: 0.2 10*3/uL (ref 0.0–0.5)
HEMATOCRIT: 36.4 % (ref 34.8–46.6)
HEMOGLOBIN: 11.3 g/dL — AB (ref 11.6–15.9)
LYMPH#: 0.7 10*3/uL — AB (ref 0.9–3.3)
LYMPH%: 11.2 % — ABNORMAL LOW (ref 14.0–49.7)
MCH: 26.5 pg (ref 25.1–34.0)
MCHC: 31 g/dL — ABNORMAL LOW (ref 31.5–36.0)
MCV: 85.2 fL (ref 79.5–101.0)
MONO#: 0.4 10*3/uL (ref 0.1–0.9)
MONO%: 6 % (ref 0.0–14.0)
NEUT%: 78.8 % — AB (ref 38.4–76.8)
NEUTROS ABS: 4.7 10*3/uL (ref 1.5–6.5)
PLATELETS: 463 10*3/uL — AB (ref 145–400)
RBC: 4.27 10*6/uL (ref 3.70–5.45)
RDW: 17.6 % — AB (ref 11.2–14.5)
WBC: 6 10*3/uL (ref 3.9–10.3)

## 2015-08-03 LAB — COMPREHENSIVE METABOLIC PANEL
ALBUMIN: 3.2 g/dL — AB (ref 3.5–5.0)
ALT: 13 U/L (ref 0–55)
ANION GAP: 11 meq/L (ref 3–11)
AST: 23 U/L (ref 5–34)
Alkaline Phosphatase: 68 U/L (ref 40–150)
BILIRUBIN TOTAL: 0.39 mg/dL (ref 0.20–1.20)
BUN: 7.2 mg/dL (ref 7.0–26.0)
CALCIUM: 10.5 mg/dL — AB (ref 8.4–10.4)
CO2: 26 mEq/L (ref 22–29)
CREATININE: 0.7 mg/dL (ref 0.6–1.1)
Chloride: 104 mEq/L (ref 98–109)
EGFR: >90 mL/min/{1.73_m2} (ref >90)
Glucose: 143 mg/dl — ABNORMAL HIGH (ref 70–140)
Potassium: 3.5 mEq/L (ref 3.5–5.1)
Sodium: 141 mEq/L (ref 136–145)
TOTAL PROTEIN: 8 g/dL (ref 6.4–8.3)

## 2015-08-03 LAB — IRON AND TIBC
%SAT: 16 % — AB (ref 21–57)
IRON: 31 ug/dL — AB (ref 41–142)
TIBC: 193 ug/dL — ABNORMAL LOW (ref 236–444)
UIBC: 161 ug/dL (ref 120–384)

## 2015-08-03 LAB — FERRITIN: FERRITIN: 187 ng/mL (ref 9–269)

## 2015-08-03 MED ORDER — HEPARIN SOD (PORK) LOCK FLUSH 100 UNIT/ML IV SOLN
500.0000 [IU] | Freq: Once | INTRAVENOUS | Status: AC
  Administered 2015-08-03: 500 [IU] via INTRAVENOUS
  Filled 2015-08-03: qty 5

## 2015-08-03 MED ORDER — SODIUM CHLORIDE 0.9 % IJ SOLN
10.0000 mL | INTRAMUSCULAR | Status: DC | PRN
  Administered 2015-08-03: 10 mL via INTRAVENOUS
  Filled 2015-08-03: qty 10

## 2015-08-03 MED ORDER — MIRTAZAPINE 15 MG PO TABS: 15.0000 mg | ORAL_TABLET | Freq: Every day | ORAL | Status: DC

## 2015-08-03 MED FILL — MIRTAZAPINE 15 MG TABLET: 15 | 30 days supply | Qty: 30 | Fill #0

## 2015-08-03 NOTE — Telephone Encounter (Signed)
per pof to schpt appt-sent back to lab-gave pt copy of avs

## 2015-08-03 NOTE — Progress Notes (Signed)
[place] [facility]  [phone] [phone]  Clinic Follow Up Note   Patient Care Team: [name], MD as PCP - General (Family Medicine) Michael Boston, MD as Consulting Physician (General Surgery) Truitt Merle, MD as Consulting Physician (Medical Oncology) Kyung Rudd, MD as Consulting Physician (Radiation Oncology) Teena Irani, MD as Consulting Physician (Gastroenterology) 08/03/2015  CHIEF COMPLAINTS:  Follow up rectal cancer   Oncology History   Rectal adenocarcinoma   Staging form: Colon and Rectum, AJCC 7th Edition     Clinical: T3, N2, M0 - Unsigned       Rectal adenocarcinoma s/p LAR resection 05/25/2015   01/13/2015 Initial Diagnosis Rectal adenocarcinoma   01/13/2015 Procedure Colonoscopy showed a sensitivity O nonobstructing mass in the rectum and from 12-18 cm proximal to the Annis. The mass was circumferential, measuring about 6 cm in length. EGD was negative.   01/17/2015 Tumor Marker CEA 1.4, CA-19-9 8, MMR normal    01/18/2015 Procedure Lower endoscopic ultrasound by Dr. Paulita Fujita showed a T3 N2 rectal mass.   01/20/2015 Imaging CT abdomen and pelvis with contrast showed right lateral rectal wall exophytic mass, and 2 ill-defined hepatic hypoenhancing lesions which appears to correspond to the lesion seen on the prior MRI in 2015.   02/05/2015 Imaging abdomen MRI showed 2 hemangioma, no suspicion for metastatic disease. CT chest was negative   02/20/2015 - 03/30/2015 Radiation Therapy neoadjuvant RT to rectal cancer    02/20/2015 Concurrent Chemotherapy capecitabine 2500 mg in the morning and 2000 mg in the evening (854m/m2, bid), on the day of radiation.    05/25/2015 Surgery Recto-sigmoid segmental resection, margins are negative    05/25/2015 Pathology Results 0.2cm residual invasive adenocarcinoma, G2, negative margins, 12 nodes were negative    06/23/2015 - 06/29/2015 Hospital Admission Patient was admitted for pelvic abscess, drain placed and she was  treated with IV antibiotics, she also received 1 dose of Feraheme for anemia.    HISTORY OF PRESENTING ILLNESS:  DPATI THINNES50y.o. female is here because of evaluation for management for newly diagnosed rectal adenocarcinoma.   On 09/06/13, she presented to Dr. HStarr Sinclair[facility]] with 430-monthistory of diarrhea and worsening abdominal pain. She has a history of chronic abdominal pain and had been previously diagnosed [place] by Dr. HaAmedeo PlentyAbdominal [place]/6/15 noted to have some hypoechoicity in the left hepatic lobe which was confirmed on MRI to be two hemangiomas  in the right hepatic lobe of measuring up to 3.0 cm along with an additional 7 mm probable cyst.   She presented with worsening of diarrhea for 6 month and was reevaluated by Dr. HaAmedeo PlentyShe was seen on 12/06/14 at which time it was decided she would undergo HIDA scan given that the pain was intermittently in the RUQ in addition to upper and lower endoscopy. HIDA scan 12/15/14 was reassuring, but she was found to have a malignant tumor of the rectum about 12-18 cm proximal to the anus, gastritis, gastric polyps on endoscopies 01/13/15. Biopsies for these three specimens along with the duodenum were collected and resulted on 01/17/15 for invasive adenocarcinoma, chronic gastritis [H. Pylori negative], fundic gland polyps, and non-specific mild patchy intra-epithelial lymphocytes. Endoscopic [place]/6/16 staged the mass at T3N2Mx and she is here today for consideration of neoadjuvant therapy.    Today, she does report abdominal pain but is no different than her baseline [right-sided, upper, lower] but  has had worsening appetite over the last 2 months. She does have diarrhea with bowel movements every 1-2 hours, especially after eating, and has been noticing bleeding with her bowel movement following her endoscopic evaluation. She also feels her energy level has  decreased. Otherwise, she denies nausea, chest pain, shortness of breath.    Family history is notable for a maternal uncle who had pancreatic cancer diagnosed in his mid to late 29s that spread to the colon and prostate before he died about 10 years later and a sister with non-melanoma skin cancer. 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 She smoked roughly 20 pack years [1 pack/week x 7 years] in the past and reports occasional alcohol use but denies any prior illicit drug use.  She lost about 7 lbs in the past 2 months  CURRENT THERAPY: observation, recovering from surgery    [place] returns for follow-up. She was admitted to hospital for pelvic abscess on 06/23/2015. She had a cutaneous change tube placed by interventional radiology, and treated with IV antibiotics. She was discharged home on 06/29/2015. She is still has 15-30 mL cloudy liquid drained every day, currently still on oral Cipro and Flagyl, and has low-grade fever at home since her hospital discharge. She has low appetite, poor taste, and a few quite fatigued. She is able to take care of herself, on pain medication every 4-6 hours, but has not been able to do much else.   HISTORY:  Past Medical History  Diagnosis Date  . Hypertension   . Hypercholesteremia   . Obesity   . Tear of left rotator cuff   . IBS (irritable bowel syndrome)   . Vertigo   . TMJ (dislocation of temporomandibular joint)   . Bulging disc     L5  . Depression   . Rectal bleeding   . Vitamin D deficiency   . Osteoarthritis, knee   . S/P radiation therapy 02/20/15-03/30/15    colon/rectal  . Complication of anesthesia   . PONV (postoperative nausea and vomiting)   . History of mitral valve prolapse   . Mild sleep apnea     does not use CPAP  . History of chemotherapy   . Numbness and tingling     great toe bilat left more than right   . History of frequent ear infections   .  History of frequent urinary tract infections     middle school age  . GERD (gastroesophageal reflux disease)   . Cancer ([place])   . Anemia   . History of blood transfusion   . Fibroids   . Cystadenoma of right ovary s/p RSO 05/25/2015 06/28/2015    SURGICAL HISTORY: Past Surgical History  Procedure Laterality Date  . Rotator cuff repair    . Eus N/A 01/18/2015    Procedure: LOWER ENDOSCOPIC ULTRASOUND (EUS);  Surgeon: Arta Silence, MD;  Location: [facility];  Service: Endoscopy;  Laterality: N/A;  . Abdominal hysterectomy  1996    Removed uterus, and tubes  . Left knee arthroscopy      mult  . Tonsillectomy    . Dilation and curettage of uterus      times 2  . Xi robotic assisted lower anterior resection N/A 05/25/2015    Procedure: XI ROBOTIC ASSISTED LOWER ANTERIOR RESECTION, , RIGID PROCTOSCOPY, RIGHT OOPHORECTOMY;  Surgeon: Michael Boston, MD;  Location: [facility];  Service: General;  Laterality: N/A;  .  Portacath placement N/A 05/25/2015    Procedure: INSERTION PORT-A-CATH;  Surgeon: Michael Boston, MD;  Location: [facility];  Service: General;  Laterality: N/A;    SOCIAL HISTORY: Social History   Social History  . Marital Status: Married    Spouse Name: N/A  . Number of Children: N/A  . Years of Education: N/A   Occupational History  . Not on file.   Social History Main Topics  . Smoking status: Former Smoker -- 0.50 packs/day for 4 years    Types: Cigarettes    Quit date: 07/15/2000  . Smokeless tobacco: Never Used     Comment: 1 pack/week intermittently x 7 years  . Alcohol Use: 0.6 oz/week    1 Shots of liquor per week     Comment: rarely   . Drug Use: No  . Sexual Activity: Not on file   Other Topics Concern  . Not on file   Social History Narrative   Tobacco Use: Cigarettes - Former Smoker   Alcohol: Yes, very rare, liquor.    No recreational drug use   Occupation: Head CMA @ [employer]   Marital Status: Married    Husband: Roselyn Reef Disabled   Children: 2  adopted kids [place]   Religion: [employer] [place] HISTORY: Family History  Problem Relation Age of Onset  . Coronary artery disease Mother 25  . Hypertension Mother   . Hyperlipidemia Mother   . Diabetes Mellitus I Mother   . Coronary artery disease Father   . Hyperlipidemia Father   . Hypertension Father   . Cancer Sister     skin - non melanoma  . Hyperlipidemia Brother   . Cancer Maternal Uncle 64    pancreatic with mets to colon and prostate  . Cirrhosis Maternal Uncle   . Hypertension Maternal Grandmother   . Diabetes Mellitus I Maternal Grandmother   . Hyperlipidemia Maternal Grandmother   . CVA Maternal Grandmother   . Hypertension Maternal Grandfather   . Coronary artery disease Maternal Grandfather 65  . Hyperlipidemia Maternal Grandfather   . Coronary artery disease Paternal Grandmother   . Hypertension Paternal Grandmother   . Hyperlipidemia Paternal Grandmother   . Diabetes Mellitus I Paternal Grandmother   . Hypertension Paternal Grandfather   . Hyperlipidemia Paternal Grandfather   . Coronary artery disease Paternal Grandfather     ALLERGIES:  is allergic to caine-1; oxycodone; penicillins; and sulfa antibiotics.  MEDICATIONS:  Current Outpatient Prescriptions  Medication Sig Dispense Refill  . Coenzyme Q10 (COQ-10) 100 MG CAPS Take 100 mg by mouth daily.    . DULoxetine (CYMBALTA) 60 MG capsule Take 60 mg by mouth at bedtime.     Marland Kitchen esomeprazole (NEXIUM) 40 MG capsule Take 40 mg by mouth 2 (two) times daily.    . furosemide (LASIX) 20 MG tablet Take 20 mg by mouth as needed for fluid or edema.     . hydrocortisone 2.5 % cream Apply 1 application topically as needed (rash).     Marland Kitchen HYDROmorphone (DILAUDID) 4 MG tablet Take 1-2 tablets (4-8 mg total) by mouth every 4 (four) hours as needed for moderate pain or severe pain. 50 tablet 0  . hyoscyamine (LEVBID) 0.375 MG 12 hr tablet Take 0.375 mg by mouth 2  (two) times daily.    Marland Kitchen lidocaine-prilocaine (EMLA) cream Apply to affected area once 30 g 3  .  meloxicam (MOBIC) 15 MG tablet Take 15 mg by mouth daily.    . methocarbamol (ROBAXIN) 500 MG tablet Take 500 mg by mouth 4 (four) times daily as needed.  1  . metroNIDAZOLE (FLAGYL) 500 MG tablet Take 1 tablet (500 mg total) by mouth 3 (three) times daily. 30 tablet 1  . nebivolol (BYSTOLIC) 10 MG tablet Take 10 mg by mouth at bedtime.     . promethazine (PHENERGAN) 25 MG tablet Take 25 mg by mouth every 6 (six) hours as needed for nausea or vomiting.    . rosuvastatin (CRESTOR) 5 MG tablet Take 5 mg by mouth at bedtime.     . traMADol (ULTRAM) 50 MG tablet Take 50 mg by mouth every 4 (four) hours as needed. pain  2  . Vitamin D, Ergocalciferol, (DRISDOL) 50000 UNITS CAPS capsule Take 50,000 Units by mouth 2 (two) times a week.    . ciprofloxacin (CIPRO) 500 MG tablet Take 1 tablet (500 mg total) by mouth 2 (two) times daily. 20 tablet 0  . clotrimazole (MYCELEX) 10 MG troche Take 10 mg by mouth 3 (three) times daily.   0  . mirtazapine (REMERON) 15 MG tablet Take 1 tablet (15 mg total) by mouth at bedtime. 30 tablet 3  . ondansetron (ZOFRAN) 8 MG tablet Take 1 tablet (8 mg total) by mouth 2 (two) times daily. Start the day after chemo for 2 days. Then take as needed for nausea or vomiting. (Patient not taking: Reported on 06/23/2015) 30 tablet 1  . Sodium Chloride Flush (NORMAL SALINE FLUSH) 0.9 % SOLN Place 5 mLs rectally daily.  0   No current facility-administered medications for this visit.   Facility-Administered Medications Ordered in Other Visits  Medication Dose Route Frequency Provider Last Rate Last Dose  . sodium chloride 0.9 % injection 10 mL  10 mL Intravenous PRN Truitt Merle, MD   10 mL at 08/03/15 1125    REVIEW OF SYSTEMS:   Constitutional: Denies fevers, chills or abnormal night sweats Eyes: Denies blurriness of vision, double vision or watery eyes Ears, nose, mouth, throat, and  face: Denies mucositis or sore throat Respiratory: Denies cough, dyspnea or wheezes Cardiovascular: Denies palpitation, chest discomfort or lower extremity swelling Gastrointestinal:(+) abdominal pain  Skin: Denies abnormal skin rashes Lymphatics: Denies new lymphadenopathy or easy bruising Neurological:Denies numbness, tingling or new weaknesses Behavioral/Psych: Mood is stable, no new changes  All other systems were reviewed with the patient and are negative.  PHYSICAL EXAMINATION: ECOG PERFORMANCE STATUS: 1 - Symptomatic but completely ambulatory  Filed Vitals:   08/03/15 1014  BP: 150/86  Pulse: 122  Temp: 99.5 F (37.5 C)  Resp: 18   Filed Weights   08/03/15 1014  Weight: 300 lb 11.2 oz (136.397 kg)    GENERAL:alert, no distress and comfortable SKIN: skin color, texture, turgor are normal, no rashes or significant lesions EYES: normal, conjunctiva are pink and non-injected, sclera clear OROPHARYNX:no exudate, no erythema and lips, buccal mucosa, and tongue normal  NECK: supple, thyroid normal size, non-tender, without nodularity LYMPH:  no palpable lymphadenopathy in the cervical, axillary or inguinal LUNGS: clear to auscultation and percussion with normal breathing effort HEART: regular rate & rhythm and no murmurs and no lower extremity edema ABDOMEN:abdomen soft, normal bowel sounds. No organomegaly,  Multiple surgical incision scar are well healed. (+) percutaneous drainage tube in the right buttock, brownish cloudy liquid in the drainage bag. Musculoskeletal:no cyanosis of digits and no clubbing  PSYCH: alert & oriented x  3 with fluent speech NEURO: no focal motor/sensory deficits   LABORATORY DATA:  I have reviewed the data as listed CBC Latest Ref Rng 08/03/2015 06/28/2015 06/26/2015  WBC 3.9 - 10.3 10e3/uL 6.0 6.3 5.9  Hemoglobin 11.6 - 15.9 g/dL 11.3(L) 6.7(LL) 6.7(LL)  Hematocrit 34.8 - 46.6 % 36.4 22.5(L) 22.4(L)  Platelets 145 - 400 10e3/uL 463(H) 396 389      CMP Latest Ref Rng 08/03/2015 06/26/2015 06/24/2015  Glucose 70 - 140 mg/dl 143(H) 103(H) 94  BUN 7.0 - 26.0 mg/dL 7._0 Creatinine 0.6 - 1.1 mg/dL 0.7 0.48 0.59  Sodium 136 - 145 mEq/L 141 139 132(L)  Potassium 3.5 - 5.1 mEq/L 3.5 3.7 3.2(L)  Chloride 101 - 111 mmol/L - 103 99(L)  CO2 22 - 29 mEq/L _1 Calcium 8.4 - 10.4 mg/dL 10.5(H) 9.0 8.8(L)  Total Protein 6.4 - 8.3 g/dL 8.0 - -  Total Bilirubin 0.20 - 1.20 mg/dL 0.39 - -  Alkaline Phos 40 - 150 U/L 68 - -  AST 5 - 34 U/L 23 - -  ALT 0 - 55 U/L 13 - -   PATHOLOGY REPORT Diagnosis 05/25/2015 1. Colon, segmental resection for tumor, recto-sigmoid INVASIVE COLONIC ADENOCARCINOMA (0.2 CM IN GREATEST DIMENSION RESIDUAL TUMOR) THE TUMOR INVADE SUBMUCOSA (0.3 CM IN DEPTH, PT1) MARGINS OF RESECTION ARE NEGATIVE FOR TUMOR TWELVE BENIGN LYMPH NODES (0/12) 2. Colon, resection margin (donut), proximal anastomotic ring BENIGN COLONIC TISSUE, NEGATIVE FOR MALIGANCY 3. Colon, resection margin (donut), distal anastomotic ring BENIGN COLONIC TISSUE, NEGATIVE FOR MALIGNANCY 4. Ovary, right BENIGN SEROUS CYSTADENOMA.  Microscopic Comment 1. COLON AND RECTUM (INCLUDING TRANS-ANAL RESECTION): Specimen: Colon-rectum Procedure: Segmental resection Tumor site: Rectum anterior wall mid to distal third of rectum Specimen integrity: intact Macroscopic intactness of mesorectum: Complete: _ Macroscopic tumor perforation: Negative Invasive tumor: Maximum size: Histologic type(s): G2 Histologic grade and differentiation: G1: well differentiated/low grade G2: moderately differentiated/low grade G3: poorly differentiated/high grade G4: undifferentiated/high grade Type of polyp in which invasive carcinoma arose: _ Microscopic extension of invasive tumor:Submucosa Lymph-Vascular invasion: Not identified Peri-neural invasion: negative Tumor deposit(s) (discontinuous extramural extension): Negative Resection margins: Proximal  margin: Negative Distal margin: Negative Circumferential (radial) (posterior ascending, posterior descending; lateral and posterior mid-rectum; and entire lower 1/3 rectum):Negatvie Mesenteric margin (sigmoid and transverse): Negative Distance closest margin (if all above margins negative): 2.5 cm Treatment effect (neo-adjuvant therapy): Present Additional polyp(s): Negative Non-neoplastic findings: Unremarkable Lymph nodes: number examined 12; number positive: 0 Pathologic Staging: T1, N0, M_ Ancillary studies: Preserved expression of the makor and minor MMR proteins MLH1, MSH2, MSH6 and PMS2. MLH1:    RADIOGRAPHIC STUDIES: I have personally reviewed the radiological images as listed and agreed with the findings in the report.  Ct Abdomen Pelvis W Contrast 01/20/2015    IMPRESSION: Right lateral rectal wall exophytic mass likely corresponding to the known neoplasm.  Two ill-defined hepatic hypo enhancing lesions which appears to correspond to the lesions seen on the prior MRI. Please note evaluation of the liver is very limited due to streak artifact. If there is clinical concern for new metastatic disease MRI may provide better evaluation.  Small left ovarian cystic lesion corresponding to the lesion reported on the prior pelvic ultrasound. Ultrasound may provide better evaluation of the pelvic structures.   Electronically Signed   By: Anner Crete M.D.   On: 01/20/2015 16:15   Lower endoscopic ultrasound by Dr. Paulita Fujita 01/18/2015 FINDINGS: Normal digital rectal exam; could not palpate mass. In proximal rectum  extending to the distal rectum, from 12 cm to 20 cm from the anal verge, a partial circumferential (50-75% circumferential distally, 75-100% circumferential proximall) mass was seen. Mass was firm, fixed, ulcerated, friable. EUS radial scope was able to traverse the tumor into normal-appearing distal sigmoid colon. Lesion invaded through the wall of the rectum into neighboring  connective tissues in multiple regions. There were several round hypoechoic well-defined malignant-appearing lymph nodes in the vicinity of the proximal portion of the tumor. STAGING: T3 N2 Mx via rectal ultrasound ENDOSCOPIC IMPRESSION: As above. Rectosigmoid mass, biopsy-proven adenocarcinoma, with local invasion noted under ultrasound. RECOMMENDATIONS: 1. Watch for potential complications of procedure. 2. Needs CT chest, abdomen, pelvis to complete staging. 3. Based already on rectal ultrasound results, patient would not be candidate for upfront surgery. She will need surgical and radiation oncology consultations. 4. Will discuss with Dr. Amedeo Plenty.  CT chest 02/07/2015 IMPRESSION: 1. No findings of metastatic disease to the chest.  Abdomen MRI w and wo contrast 02/05/2015  IMPRESSION: 1. Small cavernous hemangiomas in segments 5 and 7 of the liver redemonstrated, as above. Additional 7 mm indeterminate lesion is also favored to represent a tiny cavernous hemangioma, but may alternatively represent a small cyst. No new suspicious appearing hepatic lesions to suggest presence of metastatic disease at this time. 2. Mild hepatic steatosis.   ASSESSMENT & PLAN: Ms. Tessmer is a 50 year old female with chronic abdominal pain with rectal mass on colonoscopy found to be invasive rectal adenocarcinoma.   1. T3N2M0, Stage IIIC rectal adenocarcinoma, ypT1N0 after  Neoadjuvant chemotherapy and radiation -I previously reviewed her  Surgical pathology result in great detail with patient and her husband. She has had a good response to neoadjuvant chemoradiation, has a small residual tumor on the surgical specimen, no  Lymph node metastasis. -We discussed adjuvant chemotherapy, she is agreeable with FOLFOX. She has had port put in during her surgery.  -she has been dealing with her pelvic abscess, which is still being drained now, she isstill on oral antibiotics, with low-grade fever, would not be a  candidate for chemotherapy. -He may take her at least a few more weeks or likely even longer to resolve the pelvic abscess issue. She is over 2 months after her surgery now, the benefit of adjuvant chemotherapy after 3 months of surgery is minimal, I do not think I would offer her adjuvant chemotherapy due to the infection issue and the time frame. We reviewed her surgical pathology results again today, which showed minimal residual disease, negative lymph nodes, which predicts good outcome overall. I think surveillance alone is reasonable -We discussed the surveillance plan, I'll see her every 3-4 months for the first 2 years, then every 6 months for additional 3 years. Will check CT scan every 6-12 months.  2. Pelvic abscess and fever -This is being managed by her surgeon Dr. Johney Maine, and IR -Giving her low-grade fever, I'll obtain a blood culture from her port today, I will follow up the result   3. Microcytic anemia, secondary to GI blood loss and iron deficiency anemia -she responded to IV for him very well. Her hemoglobin dropped to 6.4 when she was in the hospital a months ago, now has improved to 11.3 after 2 doses of IV Feraheme -I'll repeat her ferritin and iron level today, to see if she is more IV Feraheme  4. HTN, depression -She'll continue follow-up with her primary care physician  5. Abdominal pain,  Secondary to surgery and abscess - she will  continue use percecet as needed   6.  Anorexia and depression -I recommend mirtazapine 15 mg at bedtime, benefits and side effects were discussed with her, she agrees. I called in to her pharmacy today   Plan: -cancel her adjuvant chemo -Port flush, check ferritin, serum iron, and blood culture today. I flow iron level, will set up iv feraheme  -I'll see her back in 1 month   I spent40 mins for the visit, >50% face to face discussion.   Truitt Merle  08/03/2015

## 2015-08-03 NOTE — Patient Instructions (Signed)

## 2015-08-04 ENCOUNTER — Other Ambulatory Visit: Payer: Self-pay | Admitting: *Deleted

## 2015-08-04 ENCOUNTER — Telehealth: Payer: Self-pay | Admitting: *Deleted

## 2015-08-04 LAB — CEA: CEA1: 1.2 ng/mL (ref 0.0–4.7)

## 2015-08-04 LAB — CEA (PARALLEL TESTING): CEA: 0.5 ng/mL (ref 0.0–5.0)

## 2015-08-04 MED FILL — HYDROmorphone HCL 4 MG TABS: 4 | 4 days supply | Qty: 50 | Fill #0

## 2015-08-04 NOTE — Telephone Encounter (Signed)
Called pt & informed of low iron level & need for feraheme in next few weeks.  POF to scheduler to call pt.  Pt expressed understanding.

## 2015-08-04 NOTE — Telephone Encounter (Signed)
-----   Message from Truitt Merle, MD sent at 08/04/2015 12:48 PM EST ----- Janifer Adie,  Please let pt know that her iron level is still low, please set up one more feraheme infusion in the next few weeks.   Thanks.  Krista Blue

## 2015-08-05 ENCOUNTER — Telehealth: Payer: Self-pay | Admitting: Hematology

## 2015-08-05 NOTE — Telephone Encounter (Signed)
Patient aware of iron appts

## 2015-08-07 ENCOUNTER — Other Ambulatory Visit (HOSPITAL_COMMUNITY): Payer: Self-pay | Admitting: Diagnostic Radiology

## 2015-08-07 ENCOUNTER — Other Ambulatory Visit (HOSPITAL_COMMUNITY): Payer: Self-pay | Admitting: Interventional Radiology

## 2015-08-07 DIAGNOSIS — N739 Female pelvic inflammatory disease, unspecified: Secondary | ICD-10-CM

## 2015-08-09 ENCOUNTER — Ambulatory Visit (HOSPITAL_BASED_OUTPATIENT_CLINIC_OR_DEPARTMENT_OTHER): Payer: BLUE CROSS/BLUE SHIELD

## 2015-08-09 VITALS — BP 126/66 | HR 76 | Temp 98.8°F | Resp 18

## 2015-08-09 DIAGNOSIS — K922 Gastrointestinal hemorrhage, unspecified: Secondary | ICD-10-CM

## 2015-08-09 DIAGNOSIS — D500 Iron deficiency anemia secondary to blood loss (chronic): Secondary | ICD-10-CM | POA: Diagnosis not present

## 2015-08-09 DIAGNOSIS — Z452 Encounter for adjustment and management of vascular access device: Secondary | ICD-10-CM | POA: Diagnosis not present

## 2015-08-09 DIAGNOSIS — D509 Iron deficiency anemia, unspecified: Secondary | ICD-10-CM

## 2015-08-09 MED ORDER — ALTEPLASE 2 MG IJ SOLR
2.0000 mg | Freq: Once | INTRAMUSCULAR | Status: AC | PRN
  Administered 2015-08-09: 2 mg
  Filled 2015-08-09: qty 2

## 2015-08-09 MED ORDER — FERUMOXYTOL INJECTION 510 MG/17 ML
510.0000 mg | Freq: Once | INTRAVENOUS | Status: AC
  Administered 2015-08-09: 510 mg via INTRAVENOUS
  Filled 2015-08-09: qty 17

## 2015-08-09 MED ORDER — SODIUM CHLORIDE 0.9 % IV SOLN
Freq: Once | INTRAVENOUS | Status: AC
  Administered 2015-08-09: 10:00:00 via INTRAVENOUS

## 2015-08-09 MED ORDER — SODIUM CHLORIDE 0.9 % IJ SOLN
10.0000 mL | INTRAMUSCULAR | Status: DC | PRN
  Administered 2015-08-09: 10 mL
  Filled 2015-08-09: qty 10

## 2015-08-09 MED ORDER — HEPARIN SOD (PORK) LOCK FLUSH 100 UNIT/ML IV SOLN
500.0000 [IU] | Freq: Once | INTRAVENOUS | Status: AC | PRN
  Administered 2015-08-09: 500 [IU]
  Filled 2015-08-09: qty 5

## 2015-08-09 MED ORDER — SODIUM CHLORIDE 0.9 % IJ SOLN
3.0000 mL | Freq: Once | INTRAMUSCULAR | Status: DC | PRN
  Filled 2015-08-09: qty 10

## 2015-08-09 NOTE — Patient Instructions (Signed)

## 2015-08-09 NOTE — Progress Notes (Signed)
Pt tolerated feraheme infusion well. Pt monitored 30 minutes post infusion, pt and VS stable at time of discharge.

## 2015-08-10 LAB — CULTURE, BLOOD (SINGLE)

## 2015-08-16 ENCOUNTER — Ambulatory Visit (HOSPITAL_BASED_OUTPATIENT_CLINIC_OR_DEPARTMENT_OTHER): Payer: BLUE CROSS/BLUE SHIELD

## 2015-08-16 ENCOUNTER — Other Ambulatory Visit: Payer: Self-pay | Admitting: Hematology

## 2015-08-16 VITALS — BP 105/65 | HR 87 | Temp 98.4°F | Resp 18

## 2015-08-16 DIAGNOSIS — D509 Iron deficiency anemia, unspecified: Secondary | ICD-10-CM

## 2015-08-16 DIAGNOSIS — D500 Iron deficiency anemia secondary to blood loss (chronic): Secondary | ICD-10-CM | POA: Diagnosis not present

## 2015-08-16 DIAGNOSIS — N920 Excessive and frequent menstruation with regular cycle: Secondary | ICD-10-CM

## 2015-08-16 MED ORDER — HEPARIN SOD (PORK) LOCK FLUSH 100 UNIT/ML IV SOLN
500.0000 [IU] | Freq: Once | INTRAVENOUS | Status: AC | PRN
  Administered 2015-08-16: 500 [IU]
  Filled 2015-08-16: qty 5

## 2015-08-16 MED ORDER — SODIUM CHLORIDE 0.9 % IV SOLN
510.0000 mg | Freq: Once | INTRAVENOUS | Status: AC
  Administered 2015-08-16: 510 mg via INTRAVENOUS
  Filled 2015-08-16: qty 17

## 2015-08-16 MED ORDER — SODIUM CHLORIDE 0.9 % IV SOLN
Freq: Once | INTRAVENOUS | Status: AC
  Administered 2015-08-16: 09:00:00 via INTRAVENOUS

## 2015-08-16 MED ORDER — SODIUM CHLORIDE 0.9 % IJ SOLN
10.0000 mL | INTRAMUSCULAR | Status: DC | PRN
  Administered 2015-08-16: 10 mL
  Filled 2015-08-16: qty 10

## 2015-08-16 NOTE — Progress Notes (Signed)
Pt tolerated treatment well. Pt monitored for 30 minutes post infusion. Pt and VS stable at time of discharge.

## 2015-08-16 NOTE — Patient Instructions (Signed)

## 2015-08-23 ENCOUNTER — Other Ambulatory Visit (HOSPITAL_COMMUNITY): Payer: Self-pay | Admitting: Diagnostic Radiology

## 2015-08-23 ENCOUNTER — Ambulatory Visit
Admission: RE | Admit: 2015-08-23 | Discharge: 2015-08-23 | Disposition: A | Discharge status: Home / Self Care | Payer: BLUE CROSS/BLUE SHIELD | Source: Ambulatory Visit | Attending: Interventional Radiology | Admitting: Interventional Radiology

## 2015-08-23 ENCOUNTER — Ambulatory Visit
Admission: RE | Admit: 2015-08-23 | Discharge: 2015-08-23 | Disposition: A | Discharge status: Home / Self Care | Payer: BLUE CROSS/BLUE SHIELD | Source: Ambulatory Visit | Attending: Diagnostic Radiology | Admitting: Diagnostic Radiology

## 2015-08-23 DIAGNOSIS — N739 Female pelvic inflammatory disease, unspecified: Secondary | ICD-10-CM

## 2015-08-23 NOTE — Consult Note (Signed)
Chief Complaint: Follow up pelvic abscess drain  Referring Physician(s): Gross, Remo Lipps  History of Present Illness: Lindsay Little is a 50 y.o. female with history of low anterior resection for rectal cancer. Patient developed a postoperative air-fluid collection in the presacral region. Patient underwent CT-guided drain placement on 06/26/2015 and it was exchanged and upsized on 06/28/2015. The patient has had a drain injection demonstrating fistula connection between the drain and the colon. Patient presents for follow-up. Patient states minimal drainage from the catheter, 5-10 mL daily.  However, the drainage is fecal-like material. Patient is using the suction bulb and she is only flushing the drain when it appears to be not working or clogged. Patient says that she is having regular bowel movements without problems.  Past Medical History  Diagnosis Date  . Hypertension   . Hypercholesteremia   . Obesity   . Tear of left rotator cuff   . IBS (irritable bowel syndrome)   . Vertigo   . TMJ (dislocation of temporomandibular joint)   . Bulging disc     L5  . Depression   . Rectal bleeding   . Vitamin D deficiency   . Osteoarthritis, knee   . S/P radiation therapy 02/20/15-03/30/15    colon/rectal  . Complication of anesthesia   . PONV (postoperative nausea and vomiting)   . History of mitral valve prolapse   . Mild sleep apnea     does not use CPAP  . History of chemotherapy   . Numbness and tingling     great toe bilat left more than right   . History of frequent ear infections   . History of frequent urinary tract infections     middle school age  . GERD (gastroesophageal reflux disease)   . Cancer ([place])   . Anemia   . History of blood transfusion   . Fibroids   . Cystadenoma of right ovary s/p RSO 05/25/2015 06/28/2015    Past Surgical History  Procedure Laterality Date  . Rotator cuff repair    . Eus N/A 01/18/2015    Procedure: LOWER ENDOSCOPIC ULTRASOUND  (EUS);  Surgeon: Arta Silence, MD;  Location: [facility];  Service: Endoscopy;  Laterality: N/A;  . Abdominal hysterectomy  1996    Removed uterus, and tubes  . Left knee arthroscopy      mult  . Tonsillectomy    . Dilation and curettage of uterus      times 2  . Xi robotic assisted lower anterior resection N/A 05/25/2015    Procedure: XI ROBOTIC ASSISTED LOWER ANTERIOR RESECTION, , RIGID PROCTOSCOPY, RIGHT OOPHORECTOMY;  Surgeon: Michael Boston, MD;  Location: [facility];  Service: General;  Laterality: N/A;  . Portacath placement N/A 05/25/2015    Procedure: INSERTION PORT-A-CATH;  Surgeon: Michael Boston, MD;  Location: [facility];  Service: General;  Laterality: N/A;    Allergies: TTSVX-7; Oxycodone; Penicillins; and Sulfa antibiotics  Medications: Prior to Admission medications   Medication Sig Start Date End Date Taking? Authorizing Provider  ciprofloxacin (CIPRO) 500 MG tablet Take 1 tablet (500 mg total) by mouth 2 (two) times daily. 06/28/15   Michael Boston, MD  clotrimazole (MYCELEX) 10 MG troche Take 10 mg by mouth 3 (three) times daily.  06/13/15   Historical Provider, MD  Coenzyme Q10 (COQ-10) 100 MG CAPS Take 100 mg by mouth daily.    Historical Provider, MD  DULoxetine (CYMBALTA) 60 MG capsule Take 60 mg by mouth at bedtime.     Historical  Provider, MD  esomeprazole (NEXIUM) 40 MG capsule Take 40 mg by mouth 2 (two) times daily.    Historical Provider, MD  furosemide (LASIX) 20 MG tablet Take 20 mg by mouth as needed for fluid or edema.     Historical Provider, MD  hydrocortisone 2.5 % cream Apply 1 application topically as needed (rash).     Historical Provider, MD  HYDROmorphone (DILAUDID) 4 MG tablet Take 1-2 tablets (4-8 mg total) by mouth every 4 (four) hours as needed for moderate pain or severe pain. 06/28/15   Michael Boston, MD  hyoscyamine (LEVBID) 0.375 MG 12 hr tablet Take 0.375 mg by mouth 2 (two) times daily.    Historical Provider, MD  lidocaine-prilocaine (EMLA) cream  Apply to affected area once 06/14/15   Truitt Merle, MD  meloxicam (MOBIC) 15 MG tablet Take 15 mg by mouth daily.    Historical Provider, MD  methocarbamol (ROBAXIN) 500 MG tablet Take 500 mg by mouth 4 (four) times daily as needed. 06/06/15   Historical Provider, MD  metroNIDAZOLE (FLAGYL) 500 MG tablet Take 1 tablet (500 mg total) by mouth 3 (three) times daily. 06/28/15   Michael Boston, MD  mirtazapine (REMERON) 15 MG tablet Take 1 tablet (15 mg total) by mouth at bedtime. 08/03/15   Truitt Merle, MD  nebivolol (BYSTOLIC) 10 MG tablet Take 10 mg by mouth at bedtime.     Historical Provider, MD  ondansetron (ZOFRAN) 8 MG tablet Take 1 tablet (8 mg total) by mouth 2 (two) times daily. Start the day after chemo for 2 days. Then take as needed for nausea or vomiting. Patient not taking: Reported on 06/23/2015 06/14/15   Truitt Merle, MD  promethazine (PHENERGAN) 25 MG tablet Take 25 mg by mouth every 6 (six) hours as needed for nausea or vomiting.    Historical Provider, MD  rosuvastatin (CRESTOR) 5 MG tablet Take 5 mg by mouth at bedtime.     Historical Provider, MD  Sodium Chloride Flush (NORMAL SALINE FLUSH) 0.9 % SOLN Place 5 mLs rectally daily. 07/05/15   Historical Provider, MD  traMADol (ULTRAM) 50 MG tablet Take 50 mg by mouth every 4 (four) hours as needed. pain 06/13/15   Historical Provider, MD  Vitamin D, Ergocalciferol, (DRISDOL) 50000 UNITS CAPS capsule Take 50,000 Units by mouth 2 (two) times a week.    Historical Provider, MD     Family History  Problem Relation Age of Onset  . Coronary artery disease Mother 56  . Hypertension Mother   . Hyperlipidemia Mother   . Diabetes Mellitus I Mother   . Coronary artery disease Father   . Hyperlipidemia Father   . Hypertension Father   . Cancer Sister     skin - non melanoma  . Hyperlipidemia Brother   . Cancer Maternal Uncle 28    pancreatic with mets to colon and prostate  . Cirrhosis Maternal Uncle   . Hypertension Maternal Grandmother   .  Diabetes Mellitus I Maternal Grandmother   . Hyperlipidemia Maternal Grandmother   . CVA Maternal Grandmother   . Hypertension Maternal Grandfather   . Coronary artery disease Maternal Grandfather 65  . Hyperlipidemia Maternal Grandfather   . Coronary artery disease Paternal Grandmother   . Hypertension Paternal Grandmother   . Hyperlipidemia Paternal Grandmother   . Diabetes Mellitus I Paternal Grandmother   . Hypertension Paternal Grandfather   . Hyperlipidemia Paternal Grandfather   . Coronary artery disease Paternal Grandfather     Social History  Social History  . Marital Status: Married    Spouse Name: N/A  . Number of Children: N/A  . Years of Education: N/A   Social History Main Topics  . Smoking status: Former Smoker -- 0.50 packs/day for 4 years    Types: Cigarettes    Quit date: 07/15/2000  . Smokeless tobacco: Never Used     Comment: 1 pack/week intermittently x 7 years  . Alcohol Use: 0.6 oz/week    1 Shots of liquor per week     Comment: rarely   . Drug Use: No  . Sexual Activity: Not on file   Other Topics Concern  . Not on file   Social History Narrative   Tobacco Use: Cigarettes - Former Smoker   Alcohol: Yes, very rare, liquor.    No recreational drug use   Occupation: Head CMA @ [employer]   Marital Status: Married    Husband: Roselyn Reef Disabled   Children: 2 adopted kids Wanda   Religion: [employer] in [place]                    Review of Systems  Gastrointestinal: Negative for rectal pain.    Vital Signs: BP 136/81 mmHg  Pulse 108  Temp(Src) 97.5 F (36.4 C) (Oral)  SpO2 98%  Physical Exam  Genitourinary:  Transgluteal drain is intact.       Imaging: Ct Abdomen Pelvis W Contrast  08/02/2015  CLINICAL DATA:  F/u pelvic abscess, hysterectomy, right oophorectomy,rectal cancer 01-13-15 with chemo, radiation and surgery 05-25-15, prev in pacs, weight loss of 75 pounds in the past few months^. Right trans  gluteal abscess drain catheter placement 06/24/2015. EXAM: CT ABDOMEN AND PELVIS WITH CONTRAST TECHNIQUE: Multidetector CT imaging of the abdomen and pelvis was performed using the standard protocol following bolus administration of intravenous contrast. CONTRAST:  120m ISOVUE-300 IOPAMIDOL (ISOVUE-300) INJECTION 61% COMPARISON:  07/20/2015 and previous FINDINGS: Lower chest:  Negative Hepatobiliary: Stable right lobe hepatic hemangiomas and focal fatty infiltration near falciform ligament. No new lesion. Gallbladder decompressed. Pancreas: Negative Spleen: Negative Adrenals/Urinary Tract: No mass, stone, hydronephrosis, ureterectasis. Urinary bladder is decompressed. Stomach/Bowel: Nondistended. Appendix not visualized. Changes of partial distal colectomy with suture lines at the level of rectum, and adjacent presacral complex gas and fluid collection measuring up to 6.7 cm transverse diameter, stable since prior exam. Previously placed percutaneous drain catheter stable in position within the collection. Vascular/Lymphatic: Sub cm bilateral common iliac chain, left para-aortic, and aortocaval lymph nodes as before. No pathologically enlarged mesenteric nodes. Reproductive: Previous hysterectomy. Other: No ascites.  No free air. Musculoskeletal: Advanced facet DJD L4-5 allowing grade 1 anterolisthesis as before. IMPRESSION: 1. Little improvement in perirectal/presacral complex gas and fluid collection despite presence of percutaneous drain catheter. Drain injection is scheduled to assess for communication to the bowel. Electronically Signed   By: DLucrezia EuropeM.D.   On: 08/02/2015 13:31   Dg Sinus/fist Tube Chk-non Gi  08/23/2015  INDICATION: Follow-up presacral abscess and colon fistula. Patient continues to have stool-like material draining from the catheter. EXAM: ABSCESS DRAIN INJECTION MEDICATIONS: None ANESTHESIA/SEDATION: None COMPLICATIONS: None immediate. FLUOROSCOPY TIME:  36 seconds, 36 dGycm2 CONTRAST:   50 mL Omnipaque 300 PROCEDURE: Patient was placed prone on the fluoroscopic table. Contrast was then injected through the transgluteal drain. The drain was flushed with normal saline and attached to gravity bag at the end of the procedure. FINDINGS: Injection of contrast demonstrated immediate filling of a bowel loop in the  pelvis. Contrast drains into the left lower abdomen. Contrast appears to be filling the distal colon. 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 In addition, the patient has fecal material draining from this catheter. The drain is now attached to a gravity bag. Recommend flushing the drain no more than once a day. Patient is scheduled to follow-up with General surgery in 1 week. We will schedule the patient for another drain injection in 3 weeks unless notified earlier by the patient or her surgeon. Electronically Signed   By: Markus Daft M.D.   On: 08/23/2015 15:59   Dg Sinus/fist Tube Chk-non Gi  08/02/2015  CLINICAL DATA:  Low anterior resection with reanastomosis. Presacral/perirectal abscess post percutaneous drain catheter placement. EXAM: Abscess CATHETER INJECTION UNDER FLUOROSCOPY TECHNIQUE: The procedure, risks (including but not limited to bleeding, infection, organ damage ), benefits, and alternatives were explained to the patient. Questions regarding the procedure were encouraged and answered. The patient understands and consents to the procedure. Survey fluoroscopic inspection reveals stable position of the trans gluteal drain catheter. Residual contrast in the urinary bladder from recent CT. Injection demonstrates partial opacification of the known residual presacral fluid collection. There is fistula to the rectum with antegrade and retrograde intraluminal spread of contrast. IMPRESSION: 1. Patent transgluteal drain catheter, with persistent fistula between collection and the rectum. Patient will follow-up with Dr. Johney Maine. Electronically Signed   By:  Lucrezia Europe M.D.   On: 08/02/2015 15:23    Labs:  CBC:  Recent Labs  06/24/15 0630 06/26/15 0001 06/28/15 1000 08/03/15 0933  WBC 6.3 5.9 6.3 6.0  HGB 6.5* 6.7* 6.7* 11.3*  HCT 21.5* 22.4* 22.5* 36.4  PLT 362 389 396 463*    COAGS:  Recent Labs  06/24/15 0630  INR 1.22    BMP:  Recent Labs  05/18/15 1450 05/26/15 0552 06/14/15 0829 06/23/15 2000 06/24/15 0630 06/26/15 0001 08/03/15 0933  NA 140 137 141  --  132* 139 141  K 4.0 4.3 4.3  --  3.2* 3.7 3.5  CL 107 106  --   --  99* 103  --   CO2 27 27 27   --  25 26 26   GLUCOSE 144* 115* 99  --  94 103* 143*  BUN 14 10 11.1  --  8 8 7.2  CALCIUM 9.7 9.1 10.2  --  8.8* 9.0 10.5*  CREATININE 0.68 0.60 0.7 0.62 0.59 0.48 0.7  GFRNONAA >60 >60  --  >60 >60 >60  --   GFRAA >60 >60  --  >60 >60 >60  --     LIVER FUNCTION TESTS:  Recent Labs  03/27/15 0956 04/18/15 0900 06/14/15 0829 08/03/15 0933  BILITOT 0.31 0.33 <0.30 0.39  AST 14 10 10 23   ALT 16 13 12 13   ALKPHOS 64 72 78 68  PROT 6.6 7.0 7.5 8.0  ALBUMIN 3.6 3.7 2.7* 3.2*    TUMOR MARKERS:  Recent Labs  02/20/15 0949 04/18/15 0900 08/03/15 0933  CEA <0.5 0.9 0.5    Assessment and Plan:  50 year old with a presacral abscess collection containing fecal-like material. Findings are suggestive for a bowel anastomosis leak in the pelvis. Drain injection confirms a persistent fistula connection between the abscess cavity and the colon.  The suction bulb was exchanged for a gravity bag. Instructed the patient to continue flushing the catheter as she has been. Patient is scheduled to follow-up with Gen. surgery in 1 week. We'll plan for a follow-up drain injection in  3 weeks unless there is an interval change.  Thank you for this interesting consult.  I greatly enjoyed meeting Lindsay Little and look forward to participating in their care.  A copy of this report was sent to the requesting provider on this date.  Electronically Signed: Carylon Perches 08/23/2015, 4:57 PM   I spent a total of   10 Minutes in face to face in clinical consultation, greater than 50% of which was counseling/coordinating care for the pelvic abscess.

## 2015-08-31 ENCOUNTER — Encounter: Payer: No Typology Code available for payment source | Admitting: Hematology

## 2015-08-31 ENCOUNTER — Encounter: Payer: Self-pay | Admitting: Hematology

## 2015-08-31 ENCOUNTER — Other Ambulatory Visit: Payer: No Typology Code available for payment source

## 2015-08-31 NOTE — Progress Notes (Signed)
No show  This encounter was created in error - please disregard.

## 2015-09-04 ENCOUNTER — Telehealth: Payer: Self-pay | Admitting: Hematology

## 2015-09-04 NOTE — Telephone Encounter (Signed)
Left message for patient re appointments 3/2.

## 2015-09-13 ENCOUNTER — Telehealth: Payer: Self-pay | Admitting: Hematology

## 2015-09-13 ENCOUNTER — Other Ambulatory Visit: Payer: BLUE CROSS/BLUE SHIELD

## 2015-09-13 NOTE — Telephone Encounter (Signed)
Left message in regards to resch. appt time and date.

## 2015-09-13 NOTE — Telephone Encounter (Signed)
Due to YF out moved 3/2 lab/flush/fu to 3/17 @ 2:15 pm. left message for patient re change.

## 2015-09-14 ENCOUNTER — Other Ambulatory Visit: Payer: BLUE CROSS/BLUE SHIELD

## 2015-09-14 ENCOUNTER — Ambulatory Visit: Payer: BLUE CROSS/BLUE SHIELD | Admitting: Hematology

## 2015-09-15 ENCOUNTER — Other Ambulatory Visit (HOSPITAL_COMMUNITY): Payer: Self-pay | Admitting: Surgery

## 2015-09-15 ENCOUNTER — Encounter (HOSPITAL_COMMUNITY): Payer: Self-pay

## 2015-09-15 ENCOUNTER — Other Ambulatory Visit: Payer: Self-pay | Admitting: Radiology

## 2015-09-15 ENCOUNTER — Other Ambulatory Visit: Payer: Self-pay | Admitting: Surgery

## 2015-09-15 ENCOUNTER — Inpatient Hospital Stay (HOSPITAL_COMMUNITY)
Admission: AD | Admit: 2015-09-15 | Discharge: 2015-09-17 | DRG: 372 | Disposition: A | Discharge status: Home / Self Care | Payer: BLUE CROSS/BLUE SHIELD | Source: Ambulatory Visit | Attending: Surgery | Admitting: Surgery

## 2015-09-15 DIAGNOSIS — Z808 Family history of malignant neoplasm of other organs or systems: Secondary | ICD-10-CM

## 2015-09-15 DIAGNOSIS — Z923 Personal history of irradiation: Secondary | ICD-10-CM

## 2015-09-15 DIAGNOSIS — Z885 Allergy status to narcotic agent status: Secondary | ICD-10-CM

## 2015-09-15 DIAGNOSIS — K5090 Crohn's disease, unspecified, without complications: Secondary | ICD-10-CM | POA: Diagnosis present

## 2015-09-15 DIAGNOSIS — Z880 Allergy status to penicillin: Secondary | ICD-10-CM

## 2015-09-15 DIAGNOSIS — Z9221 Personal history of antineoplastic chemotherapy: Secondary | ICD-10-CM

## 2015-09-15 DIAGNOSIS — I10 Essential (primary) hypertension: Secondary | ICD-10-CM | POA: Diagnosis present

## 2015-09-15 DIAGNOSIS — Z87891 Personal history of nicotine dependence: Secondary | ICD-10-CM

## 2015-09-15 DIAGNOSIS — G4730 Sleep apnea, unspecified: Secondary | ICD-10-CM | POA: Diagnosis present

## 2015-09-15 DIAGNOSIS — Z8249 Family history of ischemic heart disease and other diseases of the circulatory system: Secondary | ICD-10-CM

## 2015-09-15 DIAGNOSIS — N739 Female pelvic inflammatory disease, unspecified: Secondary | ICD-10-CM

## 2015-09-15 DIAGNOSIS — F32A Depression, unspecified: Secondary | ICD-10-CM | POA: Diagnosis present

## 2015-09-15 DIAGNOSIS — K658 Other peritonitis: Secondary | ICD-10-CM

## 2015-09-15 DIAGNOSIS — T814XXA Infection following a procedure, initial encounter: Secondary | ICD-10-CM | POA: Diagnosis present

## 2015-09-15 DIAGNOSIS — I341 Nonrheumatic mitral (valve) prolapse: Secondary | ICD-10-CM | POA: Diagnosis present

## 2015-09-15 DIAGNOSIS — K589 Irritable bowel syndrome without diarrhea: Secondary | ICD-10-CM | POA: Diagnosis present

## 2015-09-15 DIAGNOSIS — E66813 Obesity, class 3: Secondary | ICD-10-CM

## 2015-09-15 DIAGNOSIS — K219 Gastro-esophageal reflux disease without esophagitis: Secondary | ICD-10-CM | POA: Diagnosis present

## 2015-09-15 DIAGNOSIS — C20 Malignant neoplasm of rectum: Secondary | ICD-10-CM

## 2015-09-15 DIAGNOSIS — Z833 Family history of diabetes mellitus: Secondary | ICD-10-CM

## 2015-09-15 DIAGNOSIS — Z884 Allergy status to anesthetic agent status: Secondary | ICD-10-CM

## 2015-09-15 DIAGNOSIS — K651 Peritoneal abscess: Principal | ICD-10-CM | POA: Diagnosis present

## 2015-09-15 DIAGNOSIS — IMO0002 Reserved for concepts with insufficient information to code with codable children: Secondary | ICD-10-CM | POA: Diagnosis present

## 2015-09-15 DIAGNOSIS — D638 Anemia in other chronic diseases classified elsewhere: Secondary | ICD-10-CM | POA: Diagnosis present

## 2015-09-15 DIAGNOSIS — E559 Vitamin D deficiency, unspecified: Secondary | ICD-10-CM | POA: Diagnosis present

## 2015-09-15 DIAGNOSIS — F329 Major depressive disorder, single episode, unspecified: Secondary | ICD-10-CM | POA: Diagnosis present

## 2015-09-15 DIAGNOSIS — Z823 Family history of stroke: Secondary | ICD-10-CM

## 2015-09-15 DIAGNOSIS — Z882 Allergy status to sulfonamides status: Secondary | ICD-10-CM

## 2015-09-15 DIAGNOSIS — Z90710 Acquired absence of both cervix and uterus: Secondary | ICD-10-CM

## 2015-09-15 DIAGNOSIS — Z87440 Personal history of urinary (tract) infections: Secondary | ICD-10-CM

## 2015-09-15 DIAGNOSIS — Z6841 Body Mass Index (BMI) 40.0 and over, adult: Secondary | ICD-10-CM

## 2015-09-15 DIAGNOSIS — E440 Moderate protein-calorie malnutrition: Secondary | ICD-10-CM | POA: Diagnosis present

## 2015-09-15 DIAGNOSIS — M1712 Unilateral primary osteoarthritis, left knee: Secondary | ICD-10-CM | POA: Diagnosis present

## 2015-09-15 DIAGNOSIS — E7800 Pure hypercholesterolemia, unspecified: Secondary | ICD-10-CM | POA: Diagnosis present

## 2015-09-15 MED ORDER — MAGIC MOUTHWASH
15.0000 mL | Freq: Four times a day (QID) | ORAL | Status: DC | PRN
  Filled 2015-09-15: qty 15

## 2015-09-15 MED ORDER — METHOCARBAMOL 1000 MG/10ML IJ SOLN
1000.0000 mg | Freq: Four times a day (QID) | INTRAVENOUS | Status: DC | PRN
  Filled 2015-09-15: qty 10

## 2015-09-15 MED ORDER — MIRTAZAPINE 15 MG PO TABS
15.0000 mg | ORAL_TABLET | Freq: Every day | ORAL | Status: DC
  Administered 2015-09-15: 15 mg via ORAL
  Filled 2015-09-15 (×2): qty 1

## 2015-09-15 MED ORDER — POLYETHYLENE GLYCOL 3350 17 G PO PACK
17.0000 g | PACK | Freq: Every day | ORAL | Status: DC
  Filled 2015-09-15: qty 1

## 2015-09-15 MED ORDER — MELOXICAM 15 MG PO TABS
15.0000 mg | ORAL_TABLET | Freq: Every day | ORAL | Status: DC
  Administered 2015-09-16: 15 mg via ORAL
  Filled 2015-09-15 (×2): qty 1

## 2015-09-15 MED ORDER — PROMETHAZINE HCL 25 MG/ML IJ SOLN: 6.2500 mg | INTRAMUSCULAR | Status: DC | PRN

## 2015-09-15 MED ORDER — LACTATED RINGERS IV BOLUS (SEPSIS): 1000.0000 mL | Freq: Once | INTRAVENOUS | Status: DC

## 2015-09-15 MED ORDER — LORAZEPAM 2 MG/ML IJ SOLN: 0.5000 mg | Freq: Three times a day (TID) | INTRAMUSCULAR | Status: DC | PRN

## 2015-09-15 MED ORDER — DULOXETINE HCL 60 MG PO CPEP
60.0000 mg | ORAL_CAPSULE | Freq: Every day | ORAL | Status: DC
  Administered 2015-09-15 – 2015-09-16 (×2): 60 mg via ORAL
  Filled 2015-09-15 (×2): qty 1

## 2015-09-15 MED ORDER — LIDOCAINE-PRILOCAINE 2.5-2.5 % EX CREA: TOPICAL_CREAM | CUTANEOUS | Status: DC | PRN

## 2015-09-15 MED ORDER — HYOSCYAMINE SULFATE ER 0.375 MG PO TB12
0.3750 mg | ORAL_TABLET | Freq: Two times a day (BID) | ORAL | Status: DC
  Administered 2015-09-15 – 2015-09-16 (×3): 0.375 mg via ORAL
  Filled 2015-09-15 (×5): qty 1

## 2015-09-15 MED ORDER — MENTHOL 3 MG MT LOZG
1.0000 | LOZENGE | OROMUCOSAL | Status: DC | PRN
  Filled 2015-09-15: qty 9

## 2015-09-15 MED ORDER — TRAMADOL HCL 50 MG PO TABS: 50.0000 mg | ORAL_TABLET | Freq: Four times a day (QID) | ORAL | Status: DC | PRN

## 2015-09-15 MED ORDER — ROSUVASTATIN CALCIUM 5 MG PO TABS
5.0000 mg | ORAL_TABLET | Freq: Every day | ORAL | Status: DC
  Administered 2015-09-15 – 2015-09-16 (×2): 5 mg via ORAL
  Filled 2015-09-15 (×3): qty 1

## 2015-09-15 MED ORDER — ALUM & MAG HYDROXIDE-SIMETH 200-200-20 MG/5ML PO SUSP: 30.0000 mL | Freq: Four times a day (QID) | ORAL | Status: DC | PRN

## 2015-09-15 MED ORDER — ONDANSETRON HCL 4 MG/2ML IJ SOLN
4.0000 mg | Freq: Four times a day (QID) | INTRAMUSCULAR | Status: DC | PRN
  Administered 2015-09-16: 4 mg via INTRAVENOUS
  Filled 2015-09-15: qty 2

## 2015-09-15 MED ORDER — LIP MEDEX EX OINT
1.0000 "application " | TOPICAL_OINTMENT | Freq: Two times a day (BID) | CUTANEOUS | Status: DC
  Administered 2015-09-15 – 2015-09-16 (×3): 1 via TOPICAL
  Filled 2015-09-15 (×2): qty 7

## 2015-09-15 MED ORDER — METHOCARBAMOL 500 MG PO TABS: 1000.0000 mg | ORAL_TABLET | Freq: Four times a day (QID) | ORAL | Status: DC | PRN

## 2015-09-15 MED ORDER — LACTATED RINGERS IV BOLUS (SEPSIS): 1000.0000 mL | Freq: Three times a day (TID) | INTRAVENOUS | Status: DC | PRN

## 2015-09-15 MED ORDER — HEPARIN SODIUM (PORCINE) 5000 UNIT/ML IJ SOLN
5000.0000 [IU] | Freq: Three times a day (TID) | INTRAMUSCULAR | Status: DC
  Administered 2015-09-15 – 2015-09-17 (×3): 5000 [IU] via SUBCUTANEOUS
  Filled 2015-09-15 (×4): qty 1

## 2015-09-15 MED ORDER — ACETAMINOPHEN 325 MG PO TABS: 325.0000 mg | ORAL_TABLET | Freq: Four times a day (QID) | ORAL | Status: DC | PRN

## 2015-09-15 MED ORDER — SODIUM CHLORIDE 0.9 % IV SOLN
500.0000 mg | Freq: Four times a day (QID) | INTRAVENOUS | Status: DC
  Administered 2015-09-15 – 2015-09-17 (×6): 500 mg via INTRAVENOUS
  Filled 2015-09-15 (×13): qty 500

## 2015-09-15 MED ORDER — NEBIVOLOL HCL 10 MG PO TABS
10.0000 mg | ORAL_TABLET | Freq: Every day | ORAL | Status: DC
  Filled 2015-09-15 (×3): qty 1

## 2015-09-15 MED ORDER — ZOLPIDEM TARTRATE 5 MG PO TABS: 5.0000 mg | ORAL_TABLET | Freq: Every evening | ORAL | Status: DC | PRN

## 2015-09-15 MED ORDER — POLYETHYLENE GLYCOL 3350 17 G PO PACK: 17.0000 g | PACK | Freq: Two times a day (BID) | ORAL | Status: DC | PRN

## 2015-09-15 MED ORDER — PROMETHAZINE HCL 25 MG PO TABS: 25.0000 mg | ORAL_TABLET | Freq: Four times a day (QID) | ORAL | Status: DC | PRN

## 2015-09-15 MED ORDER — HYDROMORPHONE HCL 1 MG/ML IJ SOLN
0.5000 mg | INTRAMUSCULAR | Status: DC | PRN
  Administered 2015-09-17 (×2): 2 mg via INTRAVENOUS
  Filled 2015-09-15 (×2): qty 2

## 2015-09-15 MED ORDER — PHENOL 1.4 % MT LIQD: 2.0000 | OROMUCOSAL | Status: DC | PRN

## 2015-09-15 MED ORDER — ONDANSETRON HCL 4 MG PO TABS: 4.0000 mg | ORAL_TABLET | Freq: Four times a day (QID) | ORAL | Status: DC | PRN

## 2015-09-15 MED ORDER — VITAMIN D (ERGOCALCIFEROL) 1.25 MG (50000 UNIT) PO CAPS
50000.0000 [IU] | ORAL_CAPSULE | ORAL | Status: DC
  Administered 2015-09-16: 50000 [IU] via ORAL
  Filled 2015-09-15: qty 1

## 2015-09-15 MED ORDER — DIPHENHYDRAMINE HCL 50 MG/ML IJ SOLN: 12.5000 mg | Freq: Four times a day (QID) | INTRAMUSCULAR | Status: DC | PRN

## 2015-09-15 MED ORDER — SODIUM CHLORIDE 0.9 % IV SOLN
8.0000 mg | Freq: Four times a day (QID) | INTRAVENOUS | Status: DC | PRN
  Filled 2015-09-15: qty 4

## 2015-09-15 MED ORDER — PANTOPRAZOLE SODIUM 40 MG PO TBEC
80.0000 mg | DELAYED_RELEASE_TABLET | Freq: Every day | ORAL | Status: DC
  Filled 2015-09-15: qty 2

## 2015-09-15 MED ORDER — SACCHAROMYCES BOULARDII 250 MG PO CAPS
250.0000 mg | ORAL_CAPSULE | Freq: Two times a day (BID) | ORAL | Status: DC
  Administered 2015-09-15 – 2015-09-16 (×3): 250 mg via ORAL
  Filled 2015-09-15 (×3): qty 1

## 2015-09-15 MED ORDER — KCL IN DEXTROSE-NACL 40-5-0.45 MEQ/L-%-% IV SOLN
INTRAVENOUS | Status: DC
  Administered 2015-09-15: 1000 mL via INTRAVENOUS
  Administered 2015-09-16 – 2015-09-17 (×3): via INTRAVENOUS
  Filled 2015-09-15 (×4): qty 1000

## 2015-09-15 MED ORDER — METOPROLOL TARTRATE 12.5 MG HALF TABLET
12.5000 mg | ORAL_TABLET | Freq: Two times a day (BID) | ORAL | Status: DC | PRN
Start: 2015-09-15 — End: 2015-09-17
  Filled 2015-09-15: qty 1

## 2015-09-15 MED ORDER — METOPROLOL TARTRATE 1 MG/ML IV SOLN: 5.0000 mg | Freq: Four times a day (QID) | INTRAVENOUS | Status: DC | PRN

## 2015-09-15 MED ORDER — HYDROMORPHONE HCL 2 MG PO TABS
4.0000 mg | ORAL_TABLET | ORAL | Status: DC | PRN
  Administered 2015-09-15 – 2015-09-16 (×4): 4 mg via ORAL
  Administered 2015-09-16: 8 mg via ORAL
  Administered 2015-09-17: 4 mg via ORAL
  Filled 2015-09-15 (×3): qty 2
  Filled 2015-09-15: qty 4
  Filled 2015-09-15 (×2): qty 2

## 2015-09-15 MED ORDER — IOHEXOL 300 MG/ML  SOLN
100.0000 mL | Freq: Once | INTRAMUSCULAR | Status: AC | PRN
  Administered 2015-09-15: 100 mL via INTRAVENOUS

## 2015-09-15 NOTE — Progress Notes (Signed)
Pharmacy Antibiotic Note  Lindsay Little is a 50 y.o. female admitted on 09/15/2015 with intrabdominal infection.  Pharmacy has been consulted for Primaxin dosing due to PCN allergy  Plan: Primaxin 567m IV q6 to start per current weight > 70kg and normalized CrCl > 70     No data recorded.  No results for input(s): WBC, CREATININE, LATICACIDVEN, VANCOTROUGH, VANCOPEAK, VANCORANDOM, GENTTROUGH, GENTPEAK, GENTRANDOM, TOBRATROUGH, TOBRAPEAK, TOBRARND, AMIKACINPEAK, AMIKACINTROU, AMIKACIN in the last 168 hours.  CrCl cannot be calculated (Unknown ideal weight.).    Allergies  Allergen Reactions  . Caine-1 [Lidocaine] Rash    Eyes swell shut; includes all caine drugs except marcaine  . Oxycodone     NIGHTMARES. (tolerates hydrocodone or tramadol better)  . Penicillins Rash    Has patient had a PCN reaction causing immediate rash, facial/tongue/throat swelling, SOB or lightheadedness with hypotension: no Has patient had a PCN reaction causing severe rash involving mucus membranes or skin necrosis: no Has patient had a PCN reaction that required hospitalization no Has patient had a PCN reaction occurring within the last 10 years: no If all of the above answers are "NO", then may proceed with Cephalosporin use.   . Sulfa Antibiotics Rash    Rash & Vomiting    Antimicrobials this admission: Primaxin 3/3 >>   Dose adjustments this admission:   Microbiology results: none  Thank you for allowing pharmacy to be a part of this patient's care.  JAdrian Saran PharmD, BCPS Pager [phone]/09/2015 5:42 PM

## 2015-09-15 NOTE — H&P (Signed)
Lindsay SUNDERLIN  1966-06-01 [id number]  Patient Care Team: [name], MD as PCP - General (Family Medicine) Michael Boston, MD as Consulting Physician (General Surgery) Truitt Merle, MD as Consulting Physician ([place]) Kyung Rudd, MD as Consulting Physician ([place]) Teena Irani, MD as Consulting Physician (Gastroenterology)  The patient underwent robotoic low anterior resection 05/25/2015.  She had struggle with irregular bowels. Poor pain control. Improved with a bowel regimen & adjusting medications. Then worsened. CT scan concerning for gas and fluid for probable abscess. Admitted. Drain placed 06/24/2015. She was placed on intravenous antibiotics. We worked to help control her diarrhea. That began to improve. She also struggles with chronic pain and chronic nausea.  Oral Dilaudid and IV fentanyl seem to work best.  The plans to give post-adjuvant chemotherapy held. Dr. Burr Medico is aware and agrees with these plans.   Gradually improved with less output from drain & stable off antibiotics.  Drain removed after minimal cavity remaining.  Patient felt worsening drainage & pressure.  Revealed to [place] after we called her 2 weeks later.  Repeat CT scan with deeper presacral collection.  Plan drain.  Patient initially wished to do as outpatient but changed mind & agrees w recommendeation of interventional radiology for drain placement  Patient Active Problem List   Diagnosis Date Noted  . Pelvirectal abscess ([place]) 09/15/2015  . 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 Protein-calorie malnutrition, moderate ([place]) 06/25/2015  . Recurrent pelvic abscess  06/23/2015  . Anemia of chronic disease 06/14/2015  . Hypertension   . IBS (irritable bowel syndrome)   . Depression   . Cavernous hemangioma of liver - segments 5 & 7 02/07/2015  . Umbilical hernia 60/04/9322  . Osteoarthritis of left knee 02/07/2015  . Crohn's disease ([place]) 02/07/2015  . Rectal  adenocarcinoma s/p LAR resection 05/25/2015 01/31/2015  . Pure hypercholesterolemia 02/21/2014  . Morbid obesity with BMI of 50.0-59.9, adult ([place]) 02/21/2014  . Family history of ischemic heart disease 02/21/2014    Past Medical History  Diagnosis Date  . Hypertension   . Hypercholesteremia   . Obesity   . Tear of left rotator cuff   . IBS (irritable bowel syndrome)   . Vertigo   . TMJ (dislocation of temporomandibular joint)   . Bulging disc     L5  . Depression   . Rectal bleeding   . Vitamin D deficiency   . Osteoarthritis, knee   . S/P radiation therapy 02/20/15-03/30/15    colon/rectal  . Complication of anesthesia   . PONV (postoperative nausea and vomiting)   . History of mitral valve prolapse   . Mild sleep apnea     does not use CPAP  . History of chemotherapy   . Numbness and tingling     great toe bilat left more than right   . History of frequent ear infections   . History of frequent urinary tract infections     middle school age  . GERD (gastroesophageal reflux disease)   . Cancer ([place])   . Anemia   . History of blood transfusion   . Fibroids   . Cystadenoma of right ovary s/p RSO 05/25/2015 06/28/2015    Past Surgical History  Procedure Laterality Date  . Rotator cuff repair    . Eus N/A 01/18/2015    Procedure: LOWER ENDOSCOPIC ULTRASOUND (EUS);  Surgeon: Arta Silence, MD;  Location: [facility];  Service: Endoscopy;  Laterality: N/A;  . Abdominal hysterectomy  1996  Removed uterus, and tubes  . Left knee arthroscopy      mult  . Tonsillectomy    . Dilation and curettage of uterus      times 2  . Xi robotic assisted lower anterior resection N/A 05/25/2015    Procedure: XI ROBOTIC ASSISTED LOWER ANTERIOR RESECTION, , RIGID PROCTOSCOPY, RIGHT OOPHORECTOMY;  Surgeon: Michael Boston, MD;  Location: [facility];  Service: General;  Laterality: N/A;  . Portacath placement N/A 05/25/2015    Procedure: INSERTION PORT-A-CATH;  Surgeon: Michael Boston, MD;   Location: [facility];  Service: General;  Laterality: N/A;    Social History   Social History  . Marital Status: Married    Spouse Name: N/A  . Number of Children: N/A  . Years of Education: N/A   Occupational History  . Not on file.   Social History Main Topics  . Smoking status: Former Smoker -- 0.50 packs/day for 4 years    Types: Cigarettes    Quit date: 07/15/2000  . Smokeless tobacco: Never Used     Comment: 1 pack/week intermittently x 7 years  . Alcohol Use: 0.6 oz/week    1 Shots of liquor per week     Comment: rarely   . Drug Use: No  . Sexual Activity: Not on file   Other Topics Concern  . Not on file   Social History Narrative   Tobacco Use: Cigarettes - Former Smoker   Alcohol: Yes, very rare, liquor.    No recreational drug use   Occupation: Head CMA @ [employer]   Marital Status: Married    Husband: Roselyn Reef Disabled   Children: 2 adopted kids [place]   Religion: [employer] [place]                   Family History  Problem Relation Age of Onset  . Coronary artery disease Mother 33  . Hypertension Mother   . Hyperlipidemia Mother   . Diabetes Mellitus I Mother   . Coronary artery disease Father   . Hyperlipidemia Father   . Hypertension Father   . Cancer Sister     skin - non melanoma  . Hyperlipidemia Brother   . Cancer Maternal Uncle 70    pancreatic with mets to colon and prostate  . Cirrhosis Maternal Uncle   . Hypertension Maternal Grandmother   . Diabetes Mellitus I Maternal Grandmother   . Hyperlipidemia Maternal Grandmother   . CVA Maternal Grandmother   . Hypertension Maternal Grandfather   . Coronary artery disease Maternal Grandfather 65  . Hyperlipidemia Maternal Grandfather   . Coronary artery disease Paternal Grandmother   . Hypertension Paternal Grandmother   . Hyperlipidemia Paternal Grandmother   . Diabetes Mellitus I Paternal Grandmother   . Hypertension Paternal Grandfather   . Hyperlipidemia  Paternal Grandfather   . Coronary artery disease Paternal Grandfather     Current Facility-Administered Medications  Medication Dose Route Frequency Provider Last Rate Last Dose  . acetaminophen (TYLENOL) tablet 325-650 mg  325-650 mg Oral Q6H PRN Michael Boston, MD      . alum & mag hydroxide-simeth (MAALOX/MYLANTA) 200-200-20 MG/5ML suspension 30 mL  30 mL Oral Q6H PRN Michael Boston, MD      . dextrose 5 % and 0.45 % NaCl with KCl 40 mEq/L infusion   Intravenous Continuous Michael Boston, MD      . diphenhydrAMINE (BENADRYL) injection 12.5-25 mg  12.5-25 mg Intravenous Q6H PRN Michael Boston, MD      .  DULoxetine (CYMBALTA) DR capsule 60 mg  60 mg Oral QHS Michael Boston, MD      . heparin injection 5,000 Units  5,000 Units Subcutaneous 3 times per day Michael Boston, MD      . HYDROmorphone (DILAUDID) injection 0.5-2 mg  0.5-2 mg Intravenous Q2H PRN Michael Boston, MD      . HYDROmorphone (DILAUDID) tablet 4-8 mg  4-8 mg Oral Q4H PRN Michael Boston, MD      . hyoscyamine (LEVBID) 0.375 MG 12 hr tablet 0.375 mg  0.375 mg Oral BID Michael Boston, MD      . lactated ringers bolus 1,000 mL  1,000 mL Intravenous Once Michael Boston, MD      . lactated ringers bolus 1,000 mL  1,000 mL Intravenous Q8H PRN Michael Boston, MD      . lidocaine-prilocaine (EMLA) cream   Topical PRN Michael Boston, MD      . lip balm (CARMEX) ointment 1 application  1 application Topical BID Michael Boston, MD      . LORazepam (ATIVAN) injection 0.5-1 mg  0.5-1 mg Intravenous Q8H PRN Michael Boston, MD      . magic mouthwash  15 mL Oral QID PRN Michael Boston, MD      . meloxicam [facility]) tablet 15 mg  15 mg Oral Daily Michael Boston, MD      . menthol-cetylpyridinium (CEPACOL) lozenge 3 mg  1 lozenge Oral PRN Michael Boston, MD      . methocarbamol (ROBAXIN) 1,000 mg in dextrose 5 % 50 mL IVPB  1,000 mg Intravenous Q6H PRN Michael Boston, MD      . methocarbamol (ROBAXIN) tablet 1,000 mg  1,000 mg Oral Q6H PRN Michael Boston, MD      . metoprolol  (LOPRESSOR) injection 5 mg  5 mg Intravenous Q6H PRN Michael Boston, MD      . metoprolol tartrate (LOPRESSOR) tablet 12.5 mg  12.5 mg Oral Q12H PRN Michael Boston, MD      . mirtazapine (REMERON) tablet 15 mg  15 mg Oral QHS Michael Boston, MD      . nebivolol (BYSTOLIC) tablet 10 mg  10 mg Oral QHS Michael Boston, MD      . ondansetron [facility]) injection 4 mg  4 mg Intravenous Q6H PRN Michael Boston, MD       Or  . ondansetron (ZOFRAN) 8 mg in sodium chloride 0.9 % 50 mL IVPB  8 mg Intravenous Q6H PRN Michael Boston, MD      . ondansetron [facility]) tablet 4 mg  4 mg Oral Q6H PRN Michael Boston, MD      . Derrill Memo ON 09/16/2015] pantoprazole (PROTONIX) EC tablet 80 mg  80 mg Oral Q1200 Michael Boston, MD      . phenol (CHLORASEPTIC) mouth spray 2 spray  2 spray Mouth/Throat PRN Michael Boston, MD      . polyethylene glycol (MIRALAX / GLYCOLAX) packet 17 g  17 g Oral Daily Michael Boston, MD      . polyethylene glycol (MIRALAX / GLYCOLAX) packet 17 g  17 g Oral Q12H PRN Michael Boston, MD      . promethazine (PHENERGAN) injection 6.25-12.5 mg  6.25-12.5 mg Intravenous Q4H PRN Michael Boston, MD      . promethazine (PHENERGAN) tablet 25 mg  25 mg Oral Q6H PRN Michael Boston, MD      . rosuvastatin (CRESTOR) tablet 5 mg  5 mg Oral QHS Michael Boston, MD      . saccharomyces boulardii [facility])  capsule 250 mg  250 mg Oral BID Michael Boston, MD      . traMADol Veatrice Bourbon) tablet 50-100 mg  50-100 mg Oral Q6H PRN Michael Boston, MD      . Derrill Memo ON 09/18/2015] Vitamin D (Ergocalciferol) (DRISDOL) capsule 50,000 Units  50,000 Units Oral Once per day on Mon Thu Michael Boston, MD      . zolpidem [facility]) tablet 5-10 mg  5-10 mg Oral QHS PRN Michael Boston, MD         Allergies  Allergen Reactions  . Caine-1 [Lidocaine] Rash    Eyes swell shut; includes all caine drugs except marcaine  . Oxycodone     NIGHTMARES. (tolerates hydrocodone or tramadol better)  . Penicillins Rash    Has patient had a PCN reaction causing immediate rash,  facial/tongue/throat swelling, SOB or lightheadedness with hypotension: no Has patient had a PCN reaction causing severe rash involving mucus membranes or skin necrosis: no Has patient had a PCN reaction that required hospitalization no Has patient had a PCN reaction occurring within the last 10 years: no If all of the above answers are "NO", then may proceed with Cephalosporin use.   . Sulfa Antibiotics Rash    Rash & Vomiting    There were no vitals taken for this visit.  Ct Abdomen Pelvis W Contrast  09/15/2015  CLINICAL DATA:  History of low anterior resection for rectal carcinoma with postoperative abscess in the presacral region requiring percutaneous drainage catheter placement on 06/24/2015. Fistula to bowel at the level of anastomosis was demonstrated at that time. The drain was removed on 09/04/2015. She has redeveloped pelvic pain and diarrhea. EXAM: CT ABDOMEN AND PELVIS WITH CONTRAST TECHNIQUE: Multidetector CT imaging of the abdomen and pelvis was performed using the standard protocol following bolus administration of intravenous contrast. CONTRAST:  1100m OMNIPAQUE IOHEXOL 300 MG/ML  SOLN COMPARISON:  None. FINDINGS: Lower chest:  No acute findings. Hepatobiliary: Stable focal fat in the left lobe adjacent to the falciform ligament and also geographic fat in the lateral right lobe. The gallbladder is unremarkable. Pancreas: No mass, inflammatory changes, or other significant abnormality. Spleen: Within normal limits in size and appearance. Adrenals/Urinary Tract: No masses identified. No evidence of hydronephrosis. Stomach/Bowel: Recurrent complex fluid collection is identified in the lower presacral space near the the rectal anastomosis after prior low anterior resection. This collection measures approximately 5.7 x 5.9 cm transversely and extends over a craniocaudad distance of approximately 9 cm. This appears very similar to the appearance of the collection just prior to drainage  catheter placement last December. Findings likely relate to persistent leak at the level of the anastomosis. No other abnormal fluid collections identified. No free air. Vascular/Lymphatic: No pathologically enlarged lymph nodes. No evidence of abdominal aortic aneurysm. Reproductive: No mass or other significant abnormality. Other: No hernias identified. Musculoskeletal:  No suspicious bone lesions identified. IMPRESSION: Recurrent complex lower presacral fluid collection at the level of previous rectal anastomosis likely representing recurrent abscess from anastomotic leak. Electronically Signed   By: GAletta EdouardM.D.   On: 09/15/2015 15:52   Dg Sinus/fist Tube Chk-non Gi  08/23/2015  INDICATION: Follow-up presacral abscess and colon fistula. Patient continues to have stool-like material draining from the catheter. EXAM: ABSCESS DRAIN INJECTION MEDICATIONS: None ANESTHESIA/SEDATION: None COMPLICATIONS: None immediate. FLUOROSCOPY TIME:  36 seconds, 36 dGycm2 CONTRAST:  50 mL Omnipaque 300 PROCEDURE: Patient was placed prone on the fluoroscopic table. Contrast was then injected through the transgluteal drain. The drain was flushed  with normal saline and attached to gravity bag at the end of the procedure. FINDINGS: Injection of contrast demonstrated immediate filling of a bowel loop in the pelvis. Contrast drains into the left lower abdomen. Contrast appears to be filling the distal colon. 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 In addition, the patient has fecal material draining from this catheter. The drain is now attached to a gravity bag. Recommend flushing the drain no more than once a day. Patient is scheduled to follow-up with General surgery in 1 week. We will schedule the patient for another drain injection in 3 weeks unless notified earlier by the patient or her surgeon. Electronically Signed   By: Markus Daft M.D.   On: 08/23/2015 15:59    Note: This dictation  was prepared with Dragon/digital dictation along with [employer]. Any transcriptional errors that result from this process are unintentional.   [name], M.D., F.A.C.S. Gastrointestinal and Minimally Invasive Surgery [facility] [address], [place], [place] [address] [phone] Main / Paging

## 2015-09-16 ENCOUNTER — Observation Stay (HOSPITAL_COMMUNITY): Payer: BLUE CROSS/BLUE SHIELD

## 2015-09-16 ENCOUNTER — Encounter (HOSPITAL_COMMUNITY): Payer: Self-pay

## 2015-09-16 DIAGNOSIS — Z8249 Family history of ischemic heart disease and other diseases of the circulatory system: Secondary | ICD-10-CM | POA: Diagnosis not present

## 2015-09-16 DIAGNOSIS — F329 Major depressive disorder, single episode, unspecified: Secondary | ICD-10-CM | POA: Diagnosis present

## 2015-09-16 DIAGNOSIS — Z885 Allergy status to narcotic agent status: Secondary | ICD-10-CM | POA: Diagnosis not present

## 2015-09-16 DIAGNOSIS — Z6841 Body Mass Index (BMI) 40.0 and over, adult: Secondary | ICD-10-CM | POA: Diagnosis not present

## 2015-09-16 DIAGNOSIS — Z884 Allergy status to anesthetic agent status: Secondary | ICD-10-CM | POA: Diagnosis not present

## 2015-09-16 DIAGNOSIS — C20 Malignant neoplasm of rectum: Secondary | ICD-10-CM | POA: Diagnosis present

## 2015-09-16 DIAGNOSIS — K219 Gastro-esophageal reflux disease without esophagitis: Secondary | ICD-10-CM | POA: Diagnosis present

## 2015-09-16 DIAGNOSIS — G4730 Sleep apnea, unspecified: Secondary | ICD-10-CM | POA: Diagnosis present

## 2015-09-16 DIAGNOSIS — I341 Nonrheumatic mitral (valve) prolapse: Secondary | ICD-10-CM | POA: Diagnosis present

## 2015-09-16 DIAGNOSIS — Z880 Allergy status to penicillin: Secondary | ICD-10-CM | POA: Diagnosis not present

## 2015-09-16 DIAGNOSIS — Z9221 Personal history of antineoplastic chemotherapy: Secondary | ICD-10-CM | POA: Diagnosis not present

## 2015-09-16 DIAGNOSIS — Z882 Allergy status to sulfonamides status: Secondary | ICD-10-CM | POA: Diagnosis not present

## 2015-09-16 DIAGNOSIS — K5090 Crohn's disease, unspecified, without complications: Secondary | ICD-10-CM | POA: Diagnosis present

## 2015-09-16 DIAGNOSIS — E559 Vitamin D deficiency, unspecified: Secondary | ICD-10-CM | POA: Diagnosis present

## 2015-09-16 DIAGNOSIS — I10 Essential (primary) hypertension: Secondary | ICD-10-CM | POA: Diagnosis present

## 2015-09-16 DIAGNOSIS — Z808 Family history of malignant neoplasm of other organs or systems: Secondary | ICD-10-CM | POA: Diagnosis not present

## 2015-09-16 DIAGNOSIS — Z923 Personal history of irradiation: Secondary | ICD-10-CM | POA: Diagnosis not present

## 2015-09-16 DIAGNOSIS — Z833 Family history of diabetes mellitus: Secondary | ICD-10-CM | POA: Diagnosis not present

## 2015-09-16 DIAGNOSIS — M1712 Unilateral primary osteoarthritis, left knee: Secondary | ICD-10-CM | POA: Diagnosis present

## 2015-09-16 DIAGNOSIS — Z87891 Personal history of nicotine dependence: Secondary | ICD-10-CM | POA: Diagnosis not present

## 2015-09-16 DIAGNOSIS — R52 Pain, unspecified: Secondary | ICD-10-CM | POA: Diagnosis present

## 2015-09-16 DIAGNOSIS — Z823 Family history of stroke: Secondary | ICD-10-CM | POA: Diagnosis not present

## 2015-09-16 DIAGNOSIS — K651 Peritoneal abscess: Secondary | ICD-10-CM | POA: Diagnosis present

## 2015-09-16 DIAGNOSIS — E7800 Pure hypercholesterolemia, unspecified: Secondary | ICD-10-CM | POA: Diagnosis present

## 2015-09-16 DIAGNOSIS — K658 Other peritonitis: Secondary | ICD-10-CM

## 2015-09-16 DIAGNOSIS — E440 Moderate protein-calorie malnutrition: Secondary | ICD-10-CM | POA: Diagnosis present

## 2015-09-16 DIAGNOSIS — D638 Anemia in other chronic diseases classified elsewhere: Secondary | ICD-10-CM | POA: Diagnosis present

## 2015-09-16 DIAGNOSIS — IMO0002 Reserved for concepts with insufficient information to code with codable children: Secondary | ICD-10-CM | POA: Diagnosis present

## 2015-09-16 DIAGNOSIS — Z90710 Acquired absence of both cervix and uterus: Secondary | ICD-10-CM | POA: Diagnosis not present

## 2015-09-16 DIAGNOSIS — T814XXA Infection following a procedure, initial encounter: Secondary | ICD-10-CM | POA: Diagnosis present

## 2015-09-16 DIAGNOSIS — Z87440 Personal history of urinary (tract) infections: Secondary | ICD-10-CM | POA: Diagnosis not present

## 2015-09-16 LAB — BASIC METABOLIC PANEL
Anion gap: 7 (ref 5–15)
BUN: 8 mg/dL (ref 6–20)
CALCIUM: 9.2 mg/dL (ref 8.9–10.3)
CO2: 27 mmol/L (ref 22–32)
Chloride: 108 mmol/L (ref 101–111)
Creatinine, Ser: 0.48 mg/dL (ref 0.44–1.00)
GFR calc Af Amer: >60 mL/min (ref >60)
GFR calc non Af Amer: >60 mL/min (ref >60)
GLUCOSE: 116 mg/dL — AB (ref 65–99)
POTASSIUM: 4.2 mmol/L (ref 3.5–5.1)
Sodium: 142 mmol/L (ref 135–145)

## 2015-09-16 LAB — HEPATIC FUNCTION PANEL
ALBUMIN: 3 g/dL — AB (ref 3.5–5.0)
ALK PHOS: 54 U/L (ref 38–126)
ALT: 16 U/L (ref 14–54)
AST: 16 U/L (ref 15–41)
BILIRUBIN TOTAL: 0.5 mg/dL (ref 0.3–1.2)
Bilirubin, Direct: 0.1 mg/dL (ref 0.1–0.5)
Indirect Bilirubin: 0.4 mg/dL (ref 0.3–0.9)
Total Protein: 6.2 g/dL — ABNORMAL LOW (ref 6.5–8.1)

## 2015-09-16 LAB — CBC
HCT: 34.2 % — ABNORMAL LOW (ref 36.0–46.0)
Hemoglobin: 10.5 g/dL — ABNORMAL LOW (ref 12.0–15.0)
MCH: 27.7 pg (ref 26.0–34.0)
MCHC: 30.7 g/dL (ref 30.0–36.0)
MCV: 90.2 fL (ref 78.0–100.0)
PLATELETS: 299 10*3/uL (ref 150–400)
RBC: 3.79 MIL/uL — ABNORMAL LOW (ref 3.87–5.11)
RDW: 15.7 % — ABNORMAL HIGH (ref 11.5–15.5)
WBC: 5.3 10*3/uL (ref 4.0–10.5)

## 2015-09-16 LAB — PROTIME-INR
INR: 1.1 (ref 0.00–1.49)
PROTHROMBIN TIME: 14.4 s (ref 11.6–15.2)

## 2015-09-16 LAB — PREALBUMIN: Prealbumin: 8.9 mg/dL — ABNORMAL LOW (ref 18–38)

## 2015-09-16 LAB — SURGICAL PCR SCREEN
MRSA, PCR: NEGATIVE
Staphylococcus aureus: NEGATIVE

## 2015-09-16 MED ORDER — BUPIVACAINE HCL 0.5 % IJ SOLN
50.0000 mL | Freq: Once | INTRAMUSCULAR | Status: DC
  Filled 2015-09-16: qty 50

## 2015-09-16 MED ORDER — FENTANYL CITRATE (PF) 100 MCG/2ML IJ SOLN
INTRAMUSCULAR | Status: AC
  Filled 2015-09-16: qty 6

## 2015-09-16 MED ORDER — FLUCONAZOLE IN SODIUM CHLORIDE 200-0.9 MG/100ML-% IV SOLN
200.0000 mg | INTRAVENOUS | Status: DC
  Administered 2015-09-16: 200 mg via INTRAVENOUS
  Filled 2015-09-16 (×2): qty 100

## 2015-09-16 MED ORDER — SODIUM CHLORIDE 0.9 % IV SOLN: 250.0000 mL | INTRAVENOUS | Status: DC | PRN

## 2015-09-16 MED ORDER — BUPIVACAINE HCL 0.5 % IJ SOLN: 50.0000 mL | Freq: Once | INTRAMUSCULAR | Status: DC

## 2015-09-16 MED ORDER — SODIUM CHLORIDE 0.9% FLUSH: 3.0000 mL | INTRAVENOUS | Status: DC | PRN

## 2015-09-16 MED ORDER — FENTANYL CITRATE (PF) 100 MCG/2ML IJ SOLN
INTRAMUSCULAR | Status: AC | PRN
  Administered 2015-09-16 (×2): 100 ug via INTRAVENOUS
  Administered 2015-09-16 (×2): 50 ug via INTRAVENOUS

## 2015-09-16 MED ORDER — MIDAZOLAM HCL 2 MG/2ML IJ SOLN
INTRAMUSCULAR | Status: AC | PRN
  Administered 2015-09-16: 1 mg via INTRAVENOUS
  Administered 2015-09-16: 2 mg via INTRAVENOUS
  Administered 2015-09-16: 1 mg via INTRAVENOUS
  Administered 2015-09-16: 2 mg via INTRAVENOUS

## 2015-09-16 MED ORDER — SODIUM CHLORIDE 0.9% FLUSH
3.0000 mL | Freq: Two times a day (BID) | INTRAVENOUS | Status: DC
  Administered 2015-09-16: 3 mL via INTRAVENOUS

## 2015-09-16 MED ORDER — MIDAZOLAM HCL 2 MG/2ML IJ SOLN
INTRAMUSCULAR | Status: AC
  Filled 2015-09-16: qty 6

## 2015-09-16 NOTE — Progress Notes (Signed)
[facility]  [address] [place], [place] [address] Phone: [phone] FAX: [phone]   CHELSEA PEDRETTI [id number] 09-Sep-1965   Assessment  Problem List:   Principal Problem:   Recurrent pelvic abscess  Active Problems:   Morbid obesity with BMI of 50.0-59.9, adult ([place])   Rectal adenocarcinoma s/p LAR resection 05/25/2015   Hypertension   Anemia of chronic disease   Protein-calorie malnutrition, moderate ([facility])   Pelvirectal abscess ([facility])      * No surgery found *      Pelvic collection/fistula  Plan:  -s/p perc drainage -IV ABx - imipenem/fluconazole.  F/u cultures -adv diet -anxiolysis/ control depression -bowel regimen -VTE prophylaxis- SCDs, etc -mobilize as tolerated to help recovery  D/C patient from hospital when patient meets criteria (anticipate in 1-2 day(s)):  Tolerating oral intake well Ambulating well Adequate pain control without IV medications Urinating  Having flatus Disposition planning in place   [name], M.D., F.A.C.S. Gastrointestinal and Minimally Invasive Surgery [facility] [address] [place], [place] [address] [phone] Main / Paging   09/16/2015  Subjective:  Drain placed - results not available Back on floor Husband at bedside  Objective:  Vital signs:  Filed Vitals:   09/16/15 1050 09/16/15 1055 09/16/15 1100 09/16/15 1104  BP: 129/66 122/70 133/68 125/64  Pulse: 74 78 83 88  Temp:      TempSrc:      Resp: _0 Height:      Weight:      SpO2: 94% 97% 93% 94%    Last BM Date: 09/16/15  Intake/Output   Yesterday:    This shift:  Total I/O In: 500 [I.V.:500] Out: -   Bowel function:  Flatus: y  BM: y  Drain: feculent  Physical Exam:  General: Pt awake/alert/oriented x4 in mild acute distress.  Tired but not toxic Eyes: PERRL, normal EOM.  Sclera clear.  No icterus Neuro: CN II-XII  intact w/o focal sensory/motor deficits. Lymph: No head/neck/groin lymphadenopathy Psych:  No delerium/psychosis/paranoia HENT: Normocephalic, Mucus membranes moist.  No thrush Neck: Supple, No tracheal deviation Chest: No chest wall pain w good excursion CV:  Pulses intact.  Regular rhythm MS: Normal AROM mjr joints.  No obvious deformity Abdomen: Soft.  Nondistended.  nontender at incisions only.  No evidence of peritonitis.  No incarcerated hernias. R gluteal drain w scant feculent material Ext:  SCDs BLE.  No mjr edema.  No cyanosis Skin: No petechiae / purpura  Results:   Labs: Results for orders placed or performed during the hospital encounter of 09/15/15 (from the past 48 hour(s))  Basic metabolic panel     Status: Abnormal   Collection Time: 09/16/15  4:14 AM  Result Value Ref Range   Sodium 142 135 - 145 mmol/L   Potassium 4.2 3.5 - 5.1 mmol/L   Chloride 108 101 - 111 mmol/L   CO2 27 22 - 32 mmol/L   Glucose, Bld 116 (H) 65 - 99 mg/dL   BUN 8 6 - 20 mg/dL   Creatinine, Ser 0.48 0.44 - 1.00 mg/dL   Calcium 9.2 8.9 - 10.3 mg/dL   GFR calc non Af Amer >60 >60 mL/min   GFR calc Af Amer >60 >60 mL/min    Comment: (NOTE) The eGFR has been calculated using the CKD EPI equation. This calculation has not been validated in all clinical situations. eGFR's persistently <60 mL/min signify possible Chronic  Kidney Disease.    Anion gap 7 5 - 15  CBC     Status: Abnormal   Collection Time: 09/16/15  4:14 AM  Result Value Ref Range   WBC 5.3 4.0 - 10.5 K/uL   RBC 3.79 (L) 3.87 - 5.11 MIL/uL   Hemoglobin 10.5 (L) 12.0 - 15.0 g/dL   HCT 34.2 (L) 36.0 - 46.0 %   MCV 90.2 78.0 - 100.0 fL   MCH 27.7 26.0 - 34.0 pg   MCHC 30.7 30.0 - 36.0 g/dL   RDW 15.7 (H) 11.5 - 15.5 %   Platelets 299 150 - 400 K/uL  Protime-INR     Status: None   Collection Time: 09/16/15  4:14 AM  Result Value Ref Range   Prothrombin Time 14.4 11.6 - 15.2 seconds   INR 1.10 0.00 - 1.49    Imaging /  Studies: Ct Abdomen Pelvis W Contrast  09/15/2015  CLINICAL DATA:  History of low anterior resection for rectal carcinoma with postoperative abscess in the presacral region requiring percutaneous drainage catheter placement on 06/24/2015. Fistula to bowel at the level of anastomosis was demonstrated at that time. The drain was removed on 09/04/2015. She has redeveloped pelvic pain and diarrhea. EXAM: CT ABDOMEN AND PELVIS WITH CONTRAST TECHNIQUE: Multidetector CT imaging of the abdomen and pelvis was performed using the standard protocol following bolus administration of intravenous contrast. CONTRAST:  126m OMNIPAQUE IOHEXOL 300 MG/ML  SOLN COMPARISON:  None. FINDINGS: Lower chest:  No acute findings. Hepatobiliary: Stable focal fat in the left lobe adjacent to the falciform ligament and also geographic fat in the lateral right lobe. The gallbladder is unremarkable. Pancreas: No mass, inflammatory changes, or other significant abnormality. Spleen: Within normal limits in size and appearance. Adrenals/Urinary Tract: No masses identified. No evidence of hydronephrosis. Stomach/Bowel: Recurrent complex fluid collection is identified in the lower presacral space near the the rectal anastomosis after prior low anterior resection. This collection measures approximately 5.7 x 5.9 cm transversely and extends over a craniocaudad distance of approximately 9 cm. This appears very similar to the appearance of the collection just prior to drainage catheter placement last December. Findings likely relate to persistent leak at the level of the anastomosis. No other abnormal fluid collections identified. No free air. Vascular/Lymphatic: No pathologically enlarged lymph nodes. No evidence of abdominal aortic aneurysm. Reproductive: No mass or other significant abnormality. Other: No hernias identified. Musculoskeletal:  No suspicious bone lesions identified. IMPRESSION: Recurrent complex lower presacral fluid collection at the  level of previous rectal anastomosis likely representing recurrent abscess from anastomotic leak. Electronically Signed   By: GAletta EdouardM.D.   On: 09/15/2015 15:52    Medications / Allergies: per chart  Antibiotics: Anti-infectives    Start     Dose/Rate Route Frequency Ordered Stop   09/16/15 1215  fluconazole (DIFLUCAN) IVPB 200 mg     200 mg 100 mL/hr over 60 Minutes Intravenous Every 24 hours 09/16/15 1202     09/15/15 1900  imipenem-cilastatin (PRIMAXIN) 500 mg in sodium chloride 0.9 % 100 mL IVPB     500 mg 200 mL/hr over 30 Minutes Intravenous 4 times per day 09/15/15 1743          Note: Portions of this report may have been transcribed using voice recognition software. Every effort was made to ensure accuracy; however, inadvertent computerized transcription errors may be present.   Any transcriptional errors that result from this process are unintentional.     SAdin Hector  M.D., F.A.C.S. Gastrointestinal and Minimally Invasive Surgery [facility] [address] [place], [place] [address] [phone] Main / Paging   09/16/2015  CARE TEAM:  PCP: Vidal Schwalbe, MD  Outpatient Care Team: Patient Care Team: [name], MD as PCP - General (Family Medicine) Michael Boston, MD as Consulting Physician (General Surgery) Truitt Merle, MD as Consulting Physician (Medical Oncology) Kyung Rudd, MD as Consulting Physician (Radiation Oncology) Teena Irani, MD as Consulting Physician (Gastroenterology)  Inpatient Treatment Team: Treatment Team: Attending Provider: Michael Boston, MD; Registered Nurse: Lester [place], RN; Registered Nurse: Jefm Petty, RN

## 2015-09-16 NOTE — Progress Notes (Signed)
Chief Complaint: Patient was seen in consultation today for pelvic abscess at the request of Dr. Neysa Bonito  Referring Physician(s): Dr. Neysa Bonito  Supervising Physician: Sandi Mariscal MD  History of Present Illness: Lindsay Little is a 50 y.o. female with rectal cancer who underwent neoadjuvant chemoradiation therapy followed by [facility] on 05/24/25. She developed a pelvic abscess post-op which was drained percutaneously. She kept the drain for a couple months and had several drain injections which continued to show persistent fistula, the last injection being 2/8. Her drain was subsequently removed though and a repeat CT yesterday now shows recurrent pelvic abscess. She has been admitted and IR is asked to place new perc drain PMHx, meds, labs, imaging reviewed. Allergies reviewed, pt claims to be allergic to lidocaine, but Lido with Epi was documented as being used last procedure She has been NPO this am  Past Medical History  Diagnosis Date  . Hypertension   . Hypercholesteremia   . Obesity   . Tear of left rotator cuff   . IBS (irritable bowel syndrome)   . Vertigo   . TMJ (dislocation of temporomandibular joint)   . Bulging disc     L5  . Depression   . Rectal bleeding   . Vitamin D deficiency   . Osteoarthritis, knee   . S/P radiation therapy 02/20/15-03/30/15    colon/rectal  . Complication of anesthesia   . PONV (postoperative nausea and vomiting)   . History of mitral valve prolapse   . Mild sleep apnea     does not use CPAP  . History of chemotherapy   . Numbness and tingling     great toe bilat left more than right   . History of frequent ear infections   . History of frequent urinary tract infections     middle school age  . GERD (gastroesophageal reflux disease)   . Cancer ([place])   . Anemia   . History of blood transfusion   . Fibroids   . Cystadenoma of right ovary s/p RSO 05/25/2015 06/28/2015    Past Surgical History  Procedure Laterality Date  .  Rotator cuff repair    . Eus N/A 01/18/2015    Procedure: LOWER ENDOSCOPIC ULTRASOUND (EUS);  Surgeon: Arta Silence, MD;  Location: [facility];  Service: Endoscopy;  Laterality: N/A;  . Abdominal hysterectomy  1996    Removed uterus, and tubes  . Left knee arthroscopy      mult  . Tonsillectomy    . Dilation and curettage of uterus      times 2  . Xi robotic assisted lower anterior resection N/A 05/25/2015    Procedure: XI ROBOTIC ASSISTED LOWER ANTERIOR RESECTION, , RIGID PROCTOSCOPY, RIGHT OOPHORECTOMY;  Surgeon: Michael Boston, MD;  Location: [facility];  Service: General;  Laterality: N/A;  . Portacath placement N/A 05/25/2015    Procedure: INSERTION PORT-A-CATH;  Surgeon: Michael Boston, MD;  Location: [facility];  Service: General;  Laterality: N/A;    Allergies: IOEVO-3; Oxycodone; Penicillins; and Sulfa antibiotics  Medications:  Current facility-administered medications:  .  acetaminophen (TYLENOL) tablet 325-650 mg, 325-650 mg, Oral, Q6H PRN, Michael Boston, MD .  alum & mag hydroxide-simeth (MAALOX/MYLANTA) 200-200-20 MG/5ML suspension 30 mL, 30 mL, Oral, Q6H PRN, Michael Boston, MD .  dextrose 5 % and 0.45 % NaCl with KCl 40 mEq/L infusion, , Intravenous, Continuous, Michael Boston, MD, Last Rate: 100 mL/hr at 09/15/15 2128, 1,000 mL at 09/15/15 2128 .  diphenhydrAMINE (BENADRYL) injection  12.5-25 mg, 12.5-25 mg, Intravenous, Q6H PRN, Michael Boston, MD .  DULoxetine (CYMBALTA) DR capsule 60 mg, 60 mg, Oral, QHS, Michael Boston, MD, 60 mg at 09/15/15 2314 .  heparin injection 5,000 Units, 5,000 Units, Subcutaneous, 3 times per day, Michael Boston, MD, 5,000 Units at 09/15/15 2313 .  HYDROmorphone (DILAUDID) injection 0.5-2 mg, 0.5-2 mg, Intravenous, Q2H PRN, Michael Boston, MD .  HYDROmorphone (DILAUDID) tablet 4-8 mg, 4-8 mg, Oral, Q4H PRN, Michael Boston, MD, 4 mg at 09/16/15 0713 .  hyoscyamine (LEVBID) 0.375 MG 12 hr tablet 0.375 mg, 0.375 mg, Oral, BID, Michael Boston, MD, 0.375 mg at 09/15/15  2314 .  imipenem-cilastatin (PRIMAXIN) 500 mg in sodium chloride 0.9 % 100 mL IVPB, 500 mg, Intravenous, 4 times per day, Adrian Saran, RPH, 500 mg at 09/16/15 7169 .  lactated ringers bolus 1,000 mL, 1,000 mL, Intravenous, Once, Michael Boston, MD .  lactated ringers bolus 1,000 mL, 1,000 mL, Intravenous, Q8H PRN, Michael Boston, MD .  lidocaine-prilocaine (EMLA) cream, , Topical, PRN, Michael Boston, MD .  lip balm (CARMEX) ointment 1 application, 1 application, Topical, BID, Michael Boston, MD, 1 application at 67/89/38 2200 .  LORazepam (ATIVAN) injection 0.5-1 mg, 0.5-1 mg, Intravenous, Q8H PRN, Michael Boston, MD .  magic mouthwash, 15 mL, Oral, QID PRN, Michael Boston, MD .  meloxicam [facility]) tablet 15 mg, 15 mg, Oral, Daily, Michael Boston, MD .  menthol-cetylpyridinium (CEPACOL) lozenge 3 mg, 1 lozenge, Oral, PRN, Michael Boston, MD .  methocarbamol (ROBAXIN) 1,000 mg in dextrose 5 % 50 mL IVPB, 1,000 mg, Intravenous, Q6H PRN, Michael Boston, MD .  methocarbamol (ROBAXIN) tablet 1,000 mg, 1,000 mg, Oral, Q6H PRN, Michael Boston, MD .  metoprolol (LOPRESSOR) injection 5 mg, 5 mg, Intravenous, Q6H PRN, Michael Boston, MD .  metoprolol tartrate (LOPRESSOR) tablet 12.5 mg, 12.5 mg, Oral, Q12H PRN, Michael Boston, MD .  mirtazapine (REMERON) tablet 15 mg, 15 mg, Oral, QHS, Michael Boston, MD, 15 mg at 09/15/15 2314 .  nebivolol (BYSTOLIC) tablet 10 mg, 10 mg, Oral, QHS, Michael Boston, MD, 10 mg at 09/15/15 2200 .  ondansetron (ZOFRAN) injection 4 mg, 4 mg, Intravenous, Q6H PRN, 4 mg at 09/16/15 0236 **OR** ondansetron (ZOFRAN) 8 mg in sodium chloride 0.9 % 50 mL IVPB, 8 mg, Intravenous, Q6H PRN, Michael Boston, MD .  ondansetron (ZOFRAN) tablet 4 mg, 4 mg, Oral, Q6H PRN, Michael Boston, MD .  pantoprazole (PROTONIX) EC tablet 80 mg, 80 mg, Oral, Daily, Michael Boston, MD .  phenol (CHLORASEPTIC) mouth spray 2 spray, 2 spray, Mouth/Throat, PRN, Michael Boston, MD .  polyethylene glycol (MIRALAX / GLYCOLAX) packet 17 g, 17 g,  Oral, Daily, Michael Boston, MD, 17 g at 09/15/15 2200 .  polyethylene glycol (MIRALAX / GLYCOLAX) packet 17 g, 17 g, Oral, Q12H PRN, Michael Boston, MD .  promethazine (PHENERGAN) injection 6.25-12.5 mg, 6.25-12.5 mg, Intravenous, Q4H PRN, Michael Boston, MD .  promethazine (PHENERGAN) tablet 25 mg, 25 mg, Oral, Q6H PRN, Michael Boston, MD .  rosuvastatin (CRESTOR) tablet 5 mg, 5 mg, Oral, QHS, Michael Boston, MD, 5 mg at 09/15/15 2315 .  saccharomyces boulardii (FLORASTOR) capsule 250 mg, 250 mg, Oral, BID, Michael Boston, MD, 250 mg at 09/15/15 2313 .  traMADol (ULTRAM) tablet 50-100 mg, 50-100 mg, Oral, Q6H PRN, Michael Boston, MD .  Vitamin D (Ergocalciferol) (DRISDOL) capsule 50,000 Units, 50,000 Units, Oral, Once per day on Tue Sat, Michael Boston, MD .  zolpidem [facility]) tablet 5 mg, 5 mg, Oral, QHS  PRN, Michael Boston, MD    Family History  Problem Relation Age of Onset  . Coronary artery disease Mother 47  . Hypertension Mother   . Hyperlipidemia Mother   . Diabetes Mellitus I Mother   . Coronary artery disease Father   . Hyperlipidemia Father   . Hypertension Father   . Cancer Sister     skin - non melanoma  . Hyperlipidemia Brother   . Cancer Maternal Uncle 59    pancreatic with mets to colon and prostate  . Cirrhosis Maternal Uncle   . Hypertension Maternal Grandmother   . Diabetes Mellitus I Maternal Grandmother   . Hyperlipidemia Maternal Grandmother   . CVA Maternal Grandmother   . Hypertension Maternal Grandfather   . Coronary artery disease Maternal Grandfather 65  . Hyperlipidemia Maternal Grandfather   . Coronary artery disease Paternal Grandmother   . Hypertension Paternal Grandmother   . Hyperlipidemia Paternal Grandmother   . Diabetes Mellitus I Paternal Grandmother   . Hypertension Paternal Grandfather   . Hyperlipidemia Paternal Grandfather   . Coronary artery disease Paternal Grandfather     Social History   Social History  . Marital Status: Married    Spouse  Name: N/A  . Number of Children: N/A  . Years of Education: N/A   Social History Main Topics  . Smoking status: Former Smoker -- 0.50 packs/day for 4 years    Types: Cigarettes    Quit date: 07/15/2000  . Smokeless tobacco: Never Used     Comment: 1 pack/week intermittently x 7 years  . Alcohol Use: 0.6 oz/week    1 Shots of liquor per week     Comment: rarely   . Drug Use: No  . Sexual Activity: Not Asked   Other Topics Concern  . None   Social History Narrative   Tobacco Use: Cigarettes - Former Smoker   Alcohol: Yes, very rare, liquor.    No recreational drug use   Occupation: Head CMA @ [employer]   Marital Status: Married    Husband: Jamie Disabled   Children: 2 adopted kids [place]   Religion: [employer] [place]                   ECOG Status: 1 - Symptomatic but completely ambulatory  Review of Systems: A 12 point ROS discussed and pertinent positives are indicated in the HPI above.  All other systems are negative.  Review of Systems  Vital Signs: BP 119/95 mmHg  Pulse 72  Temp(Src) 98.1 F (36.7 C) (Oral)  Resp 20  Ht 5' 10.5" (1.791 m)  Wt 290 lb 12.6 oz (131.9 kg)  BMI 41.12 kg/m2  SpO2 97%  Physical Exam  Constitutional: She is oriented to person, place, and time. She appears well-developed and well-nourished. No distress.  HENT:  Head: Normocephalic.  Mouth/Throat: Oropharynx is clear and moist.  Neck: Normal range of motion. No tracheal deviation present.  Cardiovascular: Normal rate, regular rhythm and normal heart sounds.   Pulmonary/Chest: Effort normal and breath sounds normal. No respiratory distress.  Abdominal: Soft. She exhibits no mass. There is no tenderness.  Neurological: She is alert and oriented to person, place, and time.  Skin: Skin is warm and dry.  Psychiatric: She has a normal mood and affect. Judgment normal.    Mallampati Score:  MD Evaluation Airway: WNL Heart: WNL Abdomen: WNL Chest/  Lungs: WNL ASA  Classification: 2 Mallampati/Airway Score: Two  Imaging: Ct Abdomen Pelvis  W Contrast  09/15/2015  CLINICAL DATA:  History of low anterior resection for rectal carcinoma with postoperative abscess in the presacral region requiring percutaneous drainage catheter placement on 06/24/2015. Fistula to bowel at the level of anastomosis was demonstrated at that time. The drain was removed on 09/04/2015. She has redeveloped pelvic pain and diarrhea. EXAM: CT ABDOMEN AND PELVIS WITH CONTRAST TECHNIQUE: Multidetector CT imaging of the abdomen and pelvis was performed using the standard protocol following bolus administration of intravenous contrast. CONTRAST:  131m OMNIPAQUE IOHEXOL 300 MG/ML  SOLN COMPARISON:  None. FINDINGS: Lower chest:  No acute findings. Hepatobiliary: Stable focal fat in the left lobe adjacent to the falciform ligament and also geographic fat in the lateral right lobe. The gallbladder is unremarkable. Pancreas: No mass, inflammatory changes, or other significant abnormality. Spleen: Within normal limits in size and appearance. Adrenals/Urinary Tract: No masses identified. No evidence of hydronephrosis. Stomach/Bowel: Recurrent complex fluid collection is identified in the lower presacral space near the the rectal anastomosis after prior low anterior resection. This collection measures approximately 5.7 x 5.9 cm transversely and extends over a craniocaudad distance of approximately 9 cm. This appears very similar to the appearance of the collection just prior to drainage catheter placement last December. Findings likely relate to persistent leak at the level of the anastomosis. No other abnormal fluid collections identified. No free air. Vascular/Lymphatic: No pathologically enlarged lymph nodes. No evidence of abdominal aortic aneurysm. Reproductive: No mass or other significant abnormality. Other: No hernias identified. Musculoskeletal:  No suspicious bone lesions identified.  IMPRESSION: Recurrent complex lower presacral fluid collection at the level of previous rectal anastomosis likely representing recurrent abscess from anastomotic leak. Electronically Signed   By: GAletta EdouardM.D.   On: 09/15/2015 15:52   Dg Sinus/fist Tube Chk-non Gi  08/23/2015  INDICATION: Follow-up presacral abscess and colon fistula. Patient continues to have stool-like material draining from the catheter. EXAM: ABSCESS DRAIN INJECTION MEDICATIONS: None ANESTHESIA/SEDATION: None COMPLICATIONS: None immediate. FLUOROSCOPY TIME:  36 seconds, 36 dGycm2 CONTRAST:  50 mL Omnipaque 300 PROCEDURE: Patient was placed prone on the fluoroscopic table. Contrast was then injected through the transgluteal drain. The drain was flushed with normal saline and attached to gravity bag at the end of the procedure. FINDINGS: Injection of contrast demonstrated immediate filling of a bowel loop in the pelvis. Contrast drains into the left lower abdomen. Contrast appears to be filling the distal colon. 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 In addition, the patient has fecal material draining from this catheter. The drain is now attached to a gravity bag. Recommend flushing the drain no more than once a day. Patient is scheduled to follow-up with General surgery in 1 week. We will schedule the patient for another drain injection in 3 weeks unless notified earlier by the patient or her surgeon. Electronically Signed   By: AMarkus DaftM.D.   On: 08/23/2015 15:59    Labs:  CBC:  Recent Labs  06/26/15 0001 06/28/15 1000 08/03/15 0933 09/16/15 0414  WBC 5.9 6.3 6.0 5.3  HGB 6.7* 6.7* 11.3* 10.5*  HCT 22.4* 22.5* 36.4 34.2*  PLT 389 396 463* 299    COAGS:  Recent Labs  06/24/15 0630 09/16/15 0414  INR 1.22 1.10    BMP:  Recent Labs  05/26/15 0552  06/23/15 2000 06/24/15 0630 06/26/15 0001 08/03/15 0933 09/16/15 0414  NA 137  < >  --  132* 139 141 142  K 4.3  < >   --  3.2* 3.7 3.5 4.2  CL 106  --   --  99* 103  --  108  CO2 27  < >  --  25 26 26 27   GLUCOSE 115*  < >  --  94 103* 143* 116*  BUN 10  < >  --  8 8 7.2 8  CALCIUM 9.1  < >  --  8.8* 9.0 10.5* 9.2  CREATININE 0.60  < > 0.62 0.59 0.48 0.7 0.48  GFRNONAA >60  --  >60 >60 >60  --  >60  GFRAA >60  --  >60 >60 >60  --  >60  < > = values in this interval not displayed.  LIVER FUNCTION TESTS:  Recent Labs  03/27/15 0956 04/18/15 0900 06/14/15 0829 08/03/15 0933  BILITOT 0.31 0.33 <0.30 0.39  AST 14 10 10 23   ALT 16 13 12 13   ALKPHOS 64 72 78 68  PROT 6.6 7.0 7.5 8.0  ALBUMIN 3.6 3.7 2.7* 3.2*    TUMOR MARKERS:  Recent Labs  02/20/15 0949 04/18/15 0900 08/03/15 0933  CEA <0.5 0.9 0.5    Assessment and Plan: Pelvic abscess likely from continued anastomotic leak Plan for perc drain today Labs reviewed. Risks and Benefits discussed with the patient including bleeding, infection, damage to adjacent structures, bowel perforation/fistula connection, and sepsis. All of the patient's questions were answered, patient is agreeable to proceed. Consent signed and in chart.    Thank you for this interesting consult.    A copy of this report was sent to the requesting provider on this date.  Electronically Signed: Ascencion Dike 09/16/2015, 8:04 AM   I spent a total of 20 minutes in face to face in clinical consultation, greater than 50% of which was counseling/coordinating care for pelvic abscess

## 2015-09-16 NOTE — Progress Notes (Signed)
Pt. Left to CT via bed for procedure. Alert and oriented x 4, no respiratory distress noted.

## 2015-09-16 NOTE — Progress Notes (Signed)
Pt. Returned from CT procedure. Lethargic, but easy to arouse. Alert and oriented x 4. Dressing to posterior right buttocks clean, dry, and intact. JP drain intact without drainage and charged. Spouse at bedside.

## 2015-09-17 MED ORDER — HYDROMORPHONE HCL 4 MG PO TABS: 4.0000 mg | ORAL_TABLET | Freq: Four times a day (QID) | ORAL | Status: DC | PRN

## 2015-09-17 MED ORDER — METHOCARBAMOL 500 MG PO TABS: 1000.0000 mg | ORAL_TABLET | Freq: Four times a day (QID) | ORAL | Status: DC | PRN

## 2015-09-17 MED ORDER — CIPROFLOXACIN HCL 500 MG PO TABS: 750.0000 mg | ORAL_TABLET | Freq: Two times a day (BID) | ORAL | Status: DC

## 2015-09-17 MED ORDER — ALTEPLASE 2 MG IJ SOLR
2.0000 mg | Freq: Once | INTRAMUSCULAR | Status: AC
  Administered 2015-09-17: 2 mg
  Filled 2015-09-17: qty 2

## 2015-09-17 MED ORDER — CIPROFLOXACIN HCL 750 MG PO TABS: 750.0000 mg | ORAL_TABLET | Freq: Two times a day (BID) | ORAL | Status: DC

## 2015-09-17 MED ORDER — SODIUM CHLORIDE 0.9% FLUSH: 3.0000 mL | INTRAVENOUS | Status: DC | PRN

## 2015-09-17 MED ORDER — SODIUM CHLORIDE 0.9 % IV SOLN
250.0000 mL | INTRAVENOUS | Status: DC | PRN
Start: 2015-09-17 — End: 2015-09-17

## 2015-09-17 MED ORDER — SODIUM CHLORIDE 0.9% FLUSH: 3.0000 mL | Freq: Two times a day (BID) | INTRAVENOUS | Status: DC

## 2015-09-17 NOTE — Progress Notes (Signed)
Discharge teaching completed with teach back. Discharge handout instructions given and reviewed with pt. and spouse. Answered questions, prescriptions given for Ciprofloxacin and Dilaudid. Pt. to pick up at [facility]. Pt. Understands to call for follow up appointments. Pt. Right chest port occluded and IV team working on with TPA.

## 2015-09-17 NOTE — Progress Notes (Signed)
Pt. Left via ambulation, RN assisted pt. to front entrance of hospital. Pt. discharged to home, no respiratory distress noted

## 2015-09-17 NOTE — Discharge Summary (Signed)
Physician Discharge Summary  [name] MRN: [id number] DOB/AGE: [birth year] 50 y.o.  Admit date: 09/15/2015 Discharge date: 09/17/2015  Patient Care Team: [name], MD as PCP - General (Family Medicine) Michael Boston, MD as Consulting Physician (General Surgery) Truitt Merle, MD as Consulting Physician (Medical Oncology) Kyung Rudd, MD as Consulting Physician (Radiation Oncology) Teena Irani, MD as Consulting Physician (Gastroenterology)  Admission Diagnoses: Principal Problem:   Recurrent pelvic collection s/p perc drainage 09/16/2015 Active Problems:   Morbid obesity with BMI of 50.0-59.9, adult ([place])   Rectal adenocarcinoma s/p LAR resection 05/25/2015   Hypertension   IBS (irritable bowel syndrome)   Depression   Anemia of chronic disease   Protein-calorie malnutrition, moderate (HCC)   Pelvirectal abscess [facility])   Discharge Diagnoses:  Principal Problem:   Recurrent pelvic collection s/p perc drainage 09/16/2015 Active Problems:   Morbid obesity with BMI of 50.0-59.9, adult ([place])   Rectal adenocarcinoma s/p LAR resection 05/25/2015   Hypertension   IBS (irritable bowel syndrome)   Depression   Anemia of chronic disease   Protein-calorie malnutrition, moderate ([place])   Pelvirectal abscess ([place])   Consults: Interventional Radiology  Hospital Course:   Patient with h/o rectal cancer resection with delayed leak/abscess.  Had drain removed last month but felt increased drainage rectally & malaise.  Repeat CT scan noted a recurrent low pelvic collection.  Admitted.  The patient underwent percutaneous drainage of a recurrent pelvic collection by Int Rad.  Postoperatively, the patient gradually mobilized and advanced to a solid diet.  Pain and other symptoms were treated aggressively.    By the time of discharge, the patient was walking well the hallways, eating food, having flatus.  Pain was well-controlled on an oral medications.  Based on meeting discharge  criteria and continuing to recover, I felt it was safe for the patient to be discharged from the hospital to further recover with close followup.  Will need drain clinic eval in ~ 3weeks.  Patient & husband familiar with flushing of the drain & care.   Postoperative recommendations were discussed in detail.  They are written as well.   Significant Diagnostic Studies:  Results for orders placed or performed during the hospital encounter of 09/15/15 (from the past 72 hour(s))  Basic metabolic panel     Status: Abnormal   Collection Time: 09/16/15  4:14 AM  Result Value Ref Range   Sodium 142 135 - 145 mmol/L   Potassium 4.2 3.5 - 5.1 mmol/L   Chloride 108 101 - 111 mmol/L   CO2 27 22 - 32 mmol/L   Glucose, Bld 116 (H) 65 - 99 mg/dL   BUN 8 6 - 20 mg/dL   Creatinine, Ser 0.48 0.44 - 1.00 mg/dL   Calcium 9.2 8.9 - 10.3 mg/dL   GFR calc non Af Amer >60 >60 mL/min   GFR calc Af Amer >60 >60 mL/min    Comment: (NOTE) The eGFR has been calculated using the CKD EPI equation. This calculation has not been validated in all clinical situations. eGFR's persistently <60 mL/min signify possible Chronic Kidney Disease.    Anion gap 7 5 - 15  CBC     Status: Abnormal   Collection Time: 09/16/15  4:14 AM  Result Value Ref Range   WBC 5.3 4.0 - 10.5 K/uL   RBC 3.79 (L) 3.87 - 5.11 MIL/uL   Hemoglobin 10.5 (L) 12.0 - 15.0 g/dL   HCT 34.2 (L) 36.0 - 46.0 %   MCV 90.2  78.0 - 100.0 fL   MCH 27.7 26.0 - 34.0 pg   MCHC 30.7 30.0 - 36.0 g/dL   RDW 15.7 (H) 11.5 - 15.5 %   Platelets 299 150 - 400 K/uL  Protime-INR     Status: None   Collection Time: 09/16/15  4:14 AM  Result Value Ref Range   Prothrombin Time 14.4 11.6 - 15.2 seconds   INR 1.10 0.00 - 1.49  Hepatic function panel     Status: Abnormal   Collection Time: 09/16/15  4:14 AM  Result Value Ref Range   Total Protein 6.2 (L) 6.5 - 8.1 g/dL   Albumin 3.0 (L) 3.5 - 5.0 g/dL   AST 16 15 - 41 U/L   ALT 16 14 - 54 U/L   Alkaline  Phosphatase 54 38 - 126 U/L   Total Bilirubin 0.5 0.3 - 1.2 mg/dL   Bilirubin, Direct 0.1 0.1 - 0.5 mg/dL   Indirect Bilirubin 0.4 0.3 - 0.9 mg/dL  Surgical pcr screen     Status: None   Collection Time: 09/16/15  9:10 AM  Result Value Ref Range   MRSA, PCR NEGATIVE NEGATIVE   Staphylococcus aureus NEGATIVE NEGATIVE    Comment:        The Xpert SA Assay (FDA approved for NASAL specimens in patients over 69 years of age), is one component of a comprehensive surveillance program.  Test performance has been validated by [facility] for patients greater than or equal to 20 year old. It is not intended to diagnose infection nor to guide or monitor treatment.   Prealbumin     Status: Abnormal   Collection Time: 09/16/15  6:22 PM  Result Value Ref Range   Prealbumin 8.9 (L) 18 - 38 mg/dL    Comment: Performed at [facility] Abdomen Pelvis W Contrast  09/15/2015  CLINICAL DATA:  History of low anterior resection for rectal carcinoma with postoperative abscess in the presacral region requiring percutaneous drainage catheter placement on 06/24/2015. Fistula to bowel at the level of anastomosis was demonstrated at that time. The drain was removed on 09/04/2015. She has redeveloped pelvic pain and diarrhea. EXAM: CT ABDOMEN AND PELVIS WITH CONTRAST TECHNIQUE: Multidetector CT imaging of the abdomen and pelvis was performed using the standard protocol following bolus administration of intravenous contrast. CONTRAST:  191m OMNIPAQUE IOHEXOL 300 MG/ML  SOLN COMPARISON:  None. FINDINGS: Lower chest:  No acute findings. Hepatobiliary: Stable focal fat in the left lobe adjacent to the falciform ligament and also geographic fat in the lateral right lobe. The gallbladder is unremarkable. Pancreas: No mass, inflammatory changes, or other significant abnormality. Spleen: Within normal limits in size and appearance. Adrenals/Urinary Tract: No masses identified. No evidence of hydronephrosis.  Stomach/Bowel: Recurrent complex fluid collection is identified in the lower presacral space near the the rectal anastomosis after prior low anterior resection. This collection measures approximately 5.7 x 5.9 cm transversely and extends over a craniocaudad distance of approximately 9 cm. This appears very similar to the appearance of the collection just prior to drainage catheter placement last December. Findings likely relate to persistent leak at the level of the anastomosis. No other abnormal fluid collections identified. No free air. Vascular/Lymphatic: No pathologically enlarged lymph nodes. No evidence of abdominal aortic aneurysm. Reproductive: No mass or other significant abnormality. Other: No hernias identified. Musculoskeletal:  No suspicious bone lesions identified. IMPRESSION: Recurrent complex lower presacral fluid collection at the level of previous rectal anastomosis likely representing recurrent  abscess from anastomotic leak. Electronically Signed   By: Aletta Edouard M.D.   On: 09/15/2015 15:52   Ct Image Guide Drain Transvag Transrect Peritoneal Retroper  09/16/2015  INDICATION: History of colon cancer, post robotic assisted low anterior resection complicated by development of a perianastomotic leak. Patient underwent a successful percutaneous right trans gluteal approach pelvic abscess drainage catheter placement on 06/24/2015. These catheter was subsequent removed by referring surgeon secondary to decreased output, however unfortunately the patient developed recurrent rectal pain and discharge with subsequent CT scan performed on 09/15/2015 demonstrating a recurrent pre-sacral abscess worrisome for a residual or recurrent anastomotic leak. As such, request made for placement of a new pelvic drainage catheter. EXAM: CT IMAGE GUIDE DRAIN TRANSVAG TRANSRECT PERITONEAL RETROPER COMPARISON:  CT abdomen pelvis -09/15/2015 ; 08/02/2015; 07/20/2015; CT-guided percutaneous drainage catheter placement  -06/24/2015; fluoroscopic guided drainage catheter injection -08/23/2015 MEDICATIONS: The patient is currently admitted to the hospital and receiving intravenous antibiotics. The antibiotics were administered within an appropriate time frame prior to the initiation of the procedure. ANESTHESIA/SEDATION: Moderate (conscious) sedation was employed during this procedure utilizing Versed and Fentanyl. Moderate Sedation Time: 20 minutes. The patient's level of consciousness and vital signs were monitored continuously by radiology nursing throughout the procedure under my direct supervision. CONTRAST:  None COMPLICATIONS: None immediate. PROCEDURE: Informed written consent was obtained from the patient after a discussion of the risks, benefits and alternatives to treatment. The patient was placed supine on the CT gantry and a pre procedural CT was performed re-demonstrating the known abscess/fluid collection within the presacral space with dominant component measuring approximately 6.8 x 5.1 cm (image 26, series 2). The procedure was planned. A timeout was performed prior to the initiation of the procedure. The right buttocks was prepped and draped in the usual sterile fashion. The overlying soft tissues were anesthetized with 1% lidocaine with epinephrine. Appropriate trajectory was planned with the use of a 22 gauge spinal needle. An 18 gauge trocar needle was advanced into the abscess/fluid collection and a short Amplatz super stiff wire was coiled within the collection. Appropriate positioning was confirmed with a limited CT scan. The tract was serially dilated allowing placement of a 12 [place] all-purpose drainage catheter. The catheter was subsequently withdrawn to ensure all sideholes were located within the dominant component of the recurrent abscess. Appropriate positioning was confirmed with a limited postprocedural CT scan. Following percutaneous drainage catheter placement, a small amount of saline was  administered followed by vigorous aspiration of the percutaneous drainage catheter in hopes of removing as much feculent material as possible. Ultimately, approximately 80 cc of feculent material was aspirated. A sample was capped and sent to the laboratory for analysis. Ml of purulent fluid was aspirated. The tube was connected to a JP bulb and sutured in place. A dressing was placed. The patient tolerated the procedure well without immediate post procedural complication. IMPRESSION: Successful CT guided placement of a 29 French all purpose drain catheter into the midline of the lower pelvis via right trans gluteal approach with aspiration of 80 mL of feculent material. Samples were sent to the laboratory as requested by the ordering clinical team. Electronically Signed   By: Sandi Mariscal M.D.   On: 09/16/2015 12:53    Discharge Exam: Blood pressure 104/55, pulse 72, temperature 98.5 F (36.9 C), temperature source Oral, resp. rate 18, height 5' 10.5" (1.791 m), weight 131.9 kg (290 lb 12.6 oz), SpO2 96 %.  General: Pt awake/alert/oriented x4 in no major acute distress Eyes:  PERRL, normal EOM. Sclera nonicteric Neuro: CN II-XII intact w/o focal sensory/motor deficits. Lymph: No head/neck/groin lymphadenopathy Psych:  No delerium/psychosis/paranoia HENT: Normocephalic, Mucus membranes moist.  No thrush Neck: Supple, No tracheal deviation Chest: No pain.  Good respiratory excursion. CV:  Pulses intact.  Regular rhythm MS: Normal AROM mjr joints.  No obvious deformity Abdomen: Soft, Nondistended.  Nontender.  No incarcerated hernias. R gluteal drain with purulent feculent output.  No cellulitis Ext:  SCDs BLE.  No significant edema.  No cyanosis Skin: No petechiae / purpura  Discharged Condition: IMPROVED   Past Medical History  Diagnosis Date  . Hypertension   . Hypercholesteremia   . Obesity   . Tear of left rotator cuff   . IBS (irritable bowel syndrome)   . Vertigo   . TMJ  (dislocation of temporomandibular joint)   . Bulging disc     L5  . Depression   . Rectal bleeding   . Vitamin D deficiency   . Osteoarthritis, knee   . S/P radiation therapy 02/20/15-03/30/15    colon/rectal  . Complication of anesthesia   . PONV (postoperative nausea and vomiting)   . History of mitral valve prolapse   . Mild sleep apnea     does not use CPAP  . History of chemotherapy   . Numbness and tingling     great toe bilat left more than right   . History of frequent ear infections   . History of frequent urinary tract infections     middle school age  . GERD (gastroesophageal reflux disease)   . Cancer ([place])   . Anemia   . History of blood transfusion   . Fibroids   . Cystadenoma of right ovary s/p RSO 05/25/2015 06/28/2015    Past Surgical History  Procedure Laterality Date  . Rotator cuff repair    . Eus N/A 01/18/2015    Procedure: LOWER ENDOSCOPIC ULTRASOUND (EUS);  Surgeon: Arta Silence, MD;  Location: [facility];  Service: Endoscopy;  Laterality: N/A;  . Abdominal hysterectomy  1996    Removed uterus, and tubes  . Left knee arthroscopy      mult  . Tonsillectomy    . Dilation and curettage of uterus      times 2  . Xi robotic assisted lower anterior resection N/A 05/25/2015    Procedure: XI ROBOTIC ASSISTED LOWER ANTERIOR RESECTION, , RIGID PROCTOSCOPY, RIGHT OOPHORECTOMY;  Surgeon: Michael Boston, MD;  Location: [facility];  Service: General;  Laterality: N/A;  . Portacath placement N/A 05/25/2015    Procedure: INSERTION PORT-A-CATH;  Surgeon: Michael Boston, MD;  Location: [facility];  Service: General;  Laterality: N/A;    Social History   Social History  . Marital Status: Married    Spouse Name: N/A  . Number of Children: N/A  . Years of Education: N/A   Occupational History  . Not on file.   Social History Main Topics  . Smoking status: Former Smoker -- 0.50 packs/day for 4 years    Types: Cigarettes    Quit date: 07/15/2000  . Smokeless tobacco:  Never Used     Comment: 1 pack/week intermittently x 7 years  . Alcohol Use: 0.6 oz/week    1 Shots of liquor per week     Comment: rarely   . Drug Use: No  . Sexual Activity: Not on file   Other Topics Concern  . Not on file   Social History Narrative   Tobacco Use: Cigarettes - Former  Smoker   Alcohol: Yes, very rare, liquor.    No recreational drug use   Occupation: Head CMA @ [employer]   Marital Status: Married    Husband: Roselyn Reef Disabled   Children: 2 adopted kids [place]   Religion: [employer] [place]                   Family History  Problem Relation Age of Onset  . Coronary artery disease Mother 50  . Hypertension Mother   . Hyperlipidemia Mother   . Diabetes Mellitus I Mother   . Coronary artery disease Father   . Hyperlipidemia Father   . Hypertension Father   . Cancer Sister     skin - non melanoma  . Hyperlipidemia Brother   . Cancer Maternal Uncle 63    pancreatic with mets to colon and prostate  . Cirrhosis Maternal Uncle   . Hypertension Maternal Grandmother   . Diabetes Mellitus I Maternal Grandmother   . Hyperlipidemia Maternal Grandmother   . CVA Maternal Grandmother   . Hypertension Maternal Grandfather   . Coronary artery disease Maternal Grandfather 65  . Hyperlipidemia Maternal Grandfather   . Coronary artery disease Paternal Grandmother   . Hypertension Paternal Grandmother   . Hyperlipidemia Paternal Grandmother   . Diabetes Mellitus I Paternal Grandmother   . Hypertension Paternal Grandfather   . Hyperlipidemia Paternal Grandfather   . Coronary artery disease Paternal Grandfather     Current Facility-Administered Medications  Medication Dose Route Frequency Provider Last Rate Last Dose  . 0.9 %  sodium chloride infusion  250 mL Intravenous PRN Michael Boston, MD      . 0.9 %  sodium chloride infusion  250 mL Intravenous PRN Michael Boston, MD      . acetaminophen (TYLENOL) tablet 325-650 mg  325-650 mg Oral Q6H  PRN Michael Boston, MD      . alum & mag hydroxide-simeth (MAALOX/MYLANTA) 200-200-20 MG/5ML suspension 30 mL  30 mL Oral Q6H PRN Michael Boston, MD      . bupivacaine (MARCAINE) 0.5 % (with pres) injection 50 mL  50 mL Infiltration Once Sandi Mariscal, MD      . diphenhydrAMINE (BENADRYL) injection 12.5-25 mg  12.5-25 mg Intravenous Q6H PRN Michael Boston, MD      . DULoxetine (CYMBALTA) DR capsule 60 mg  60 mg Oral QHS Michael Boston, MD   60 mg at 09/16/15 2156  . fluconazole (DIFLUCAN) IVPB 200 mg  200 mg Intravenous Q24H Michael Boston, MD   200 mg at 09/16/15 1326  . heparin injection 5,000 Units  5,000 Units Subcutaneous 3 times per day Michael Boston, MD   5,000 Units at 09/17/15 [phone]  . HYDROmorphone (DILAUDID) injection 0.5-2 mg  0.5-2 mg Intravenous Q2H PRN Michael Boston, MD   2 mg at 09/17/15 0549  . HYDROmorphone (DILAUDID) tablet 4-8 mg  4-8 mg Oral Q4H PRN Michael Boston, MD   8 mg at 09/16/15 2232  . hyoscyamine (LEVBID) 0.375 MG 12 hr tablet 0.375 mg  0.375 mg Oral BID Michael Boston, MD   0.375 mg at 09/16/15 2156  . imipenem-cilastatin (PRIMAXIN) 500 mg in sodium chloride 0.9 % 100 mL IVPB  500 mg Intravenous 4 times per day Adrian Saran, RPH   500 mg at 09/17/15 6962  . lactated ringers bolus 1,000 mL  1,000 mL Intravenous Once Michael Boston, MD      . lactated ringers bolus 1,000 mL  1,000 mL Intravenous Q8H  PRN Michael Boston, MD      . lidocaine-prilocaine (EMLA) cream   Topical PRN Michael Boston, MD      . lip balm (CARMEX) ointment 1 application  1 application Topical BID Michael Boston, MD   1 application at 61/44/31 2158  . LORazepam (ATIVAN) injection 0.5-1 mg  0.5-1 mg Intravenous Q8H PRN Michael Boston, MD      . magic mouthwash  15 mL Oral QID PRN Michael Boston, MD      . meloxicam [facility]) tablet 15 mg  15 mg Oral Daily Michael Boston, MD   15 mg at 09/16/15 1330  . menthol-cetylpyridinium (CEPACOL) lozenge 3 mg  1 lozenge Oral PRN Michael Boston, MD      . methocarbamol (ROBAXIN) 1,000 mg in  dextrose 5 % 50 mL IVPB  1,000 mg Intravenous Q6H PRN Michael Boston, MD      . methocarbamol (ROBAXIN) tablet 1,000 mg  1,000 mg Oral Q6H PRN Michael Boston, MD      . metoprolol (LOPRESSOR) injection 5 mg  5 mg Intravenous Q6H PRN Michael Boston, MD      . metoprolol tartrate (LOPRESSOR) tablet 12.5 mg  12.5 mg Oral Q12H PRN Michael Boston, MD      . mirtazapine (REMERON) tablet 15 mg  15 mg Oral QHS Michael Boston, MD   15 mg at 09/15/15 2314  . nebivolol (BYSTOLIC) tablet 10 mg  10 mg Oral QHS Michael Boston, MD   10 mg at 09/15/15 2200  . ondansetron (ZOFRAN) injection 4 mg  4 mg Intravenous Q6H PRN Michael Boston, MD   4 mg at 09/16/15 0236   Or  . ondansetron (ZOFRAN) 8 mg in sodium chloride 0.9 % 50 mL IVPB  8 mg Intravenous Q6H PRN Michael Boston, MD      . pantoprazole (PROTONIX) EC tablet 80 mg  80 mg Oral Daily Michael Boston, MD   80 mg at 09/16/15 1000  . phenol (CHLORASEPTIC) mouth spray 2 spray  2 spray Mouth/Throat PRN Michael Boston, MD      . polyethylene glycol (MIRALAX / GLYCOLAX) packet 17 g  17 g Oral Daily Michael Boston, MD   17 g at 09/15/15 2200  . polyethylene glycol (MIRALAX / GLYCOLAX) packet 17 g  17 g Oral Q12H PRN Michael Boston, MD      . promethazine (PHENERGAN) injection 6.25-12.5 mg  6.25-12.5 mg Intravenous Q4H PRN Michael Boston, MD      . promethazine (PHENERGAN) tablet 25 mg  25 mg Oral Q6H PRN Michael Boston, MD      . rosuvastatin (CRESTOR) tablet 5 mg  5 mg Oral QHS Michael Boston, MD   5 mg at 09/16/15 2156  . saccharomyces boulardii (FLORASTOR) capsule 250 mg  250 mg Oral BID Michael Boston, MD   250 mg at 09/16/15 2155  . sodium chloride flush (NS) 0.9 % injection 3 mL  3 mL Intravenous Q12H Michael Boston, MD   3 mL at 09/16/15 2155  . sodium chloride flush (NS) 0.9 % injection 3 mL  3 mL Intravenous PRN Michael Boston, MD      . sodium chloride flush (NS) 0.9 % injection 3 mL  3 mL Intravenous Q12H Michael Boston, MD      . sodium chloride flush (NS) 0.9 % injection 3 mL  3 mL  Intravenous PRN Michael Boston, MD      . traMADol Veatrice Bourbon) tablet 50-100 mg  50-100 mg Oral Q6H PRN Michael Boston, MD      .  Vitamin D (Ergocalciferol) (DRISDOL) capsule 50,000 Units  50,000 Units Oral Once per day on Tue Sat Michael Boston, MD   50,000 Units at 09/16/15 1329  . zolpidem (AMBIEN) tablet 5 mg  5 mg Oral QHS PRN Michael Boston, MD         Allergies  Allergen Reactions  . Caine-1 [Lidocaine] Rash    Eyes swell shut; includes all caine drugs except marcaine  . Oxycodone     NIGHTMARES. (tolerates hydrocodone or tramadol better)  . Penicillins Rash    Has patient had a PCN reaction causing immediate rash, facial/tongue/throat swelling, SOB or lightheadedness with hypotension: no Has patient had a PCN reaction causing severe rash involving mucus membranes or skin necrosis: no Has patient had a PCN reaction that required hospitalization no Has patient had a PCN reaction occurring within the last 10 years: no If all of the above answers are "NO", then may proceed with Cephalosporin use.   . Sulfa Antibiotics Rash    Rash & Vomiting    Disposition: 01-Home or Self Care  Discharge Instructions    Call MD for:  extreme fatigue    Complete by:  As directed      Call MD for:  hives    Complete by:  As directed      Call MD for:  persistant nausea and vomiting    Complete by:  As directed      Call MD for:  redness, tenderness, or signs of infection (pain, swelling, redness, odor or green/yellow discharge around incision site)    Complete by:  As directed      Call MD for:  severe uncontrolled pain    Complete by:  As directed      Call MD for:    Complete by:  As directed   Temperature > 101.22F     Diet - low sodium heart healthy    Complete by:  As directed      Discharge instructions    Complete by:  As directed   Please see discharge instruction sheets.  Also refer to handout given an office.  Please call our office if you have any questions or concerns [phone]      Discharge wound care:    Complete by:  As directed   If you have closed incisions, shower and bathe over these incisions with soap and water every day.  Remove all surgical dressings on postoperative day #3.  You do not need to replace dressings over the closed incisions unless you feel more comfortable with a Band-Aid covering it.   If you have an open wound that requires packing, please see wound care instructions.  In general, remove all dressings, wash wound with soap and water and then replace with saline moistened gauze.  Do the dressing change at least every day.  Please call our office [phone] if you have further questions.     Driving Restrictions    Complete by:  As directed   No driving until off narcotics and can safely swerve away without pain during an emergency     Increase activity slowly    Complete by:  As directed   Walk an hour a day.  Use 20-30 minute walks.  When you can walk 30 minutes without difficulty, it is fine to restart low impact/moderate activities such as biking, jogging, swimming, sexual activity, etc.  Eventually you can increase to unrestricted activity when not feeling pain.  If you feel pain: STOP!Marland Kitchen  Let pain protect you from overdoing it.  Use ice/heat & over-the-counter pain medications to help minimize soreness.  If that is not enough, then use your narcotic pain prescription as needed to remain active.  It is better to take extra pain medications and be more active than to stay bedridden to avoid all pain medications.     Lifting restrictions    Complete by:  As directed   Avoid heavy lifting initially.  Do not push through pain.  You have no specific weight limit - if it hurts to do, DON'T DO IT.   If you feel no pain, you are not injuring anything.  Pain will protect you from injury.  Coughing and sneezing are far more stressful to your incision than any lifting.  Avoid resuming heavy lifting / intense activity until off all narcotic pain medications.   When ready to exercise more, give yourself 2 weeks to gradually get back to full intense exercise/activity.     May shower / Bathe    Complete by:  As directed      May walk up steps    Complete by:  As directed      Sexual Activity Restrictions    Complete by:  As directed   Sexual activity as tolerated.  Do not push through pain.  Pain will protect you from injury.     Walk with assistance    Complete by:  As directed   Walk over an hour a day.  May use a walker/cane/companion to help with balance and stamina.            Medication List    TAKE these medications        ciprofloxacin 750 MG tablet  Commonly known as:  CIPRO  Take 1 tablet (750 mg total) by mouth 2 (two) times daily.     CoQ-10 100 MG Caps  Take 100 mg by mouth daily.     DULoxetine 60 MG capsule  Commonly known as:  CYMBALTA  Take 60 mg by mouth at bedtime.     esomeprazole 40 MG capsule  Commonly known as:  NEXIUM  Take 40 mg by mouth 2 (two) times daily as needed (heartburn/ acid reflux).     furosemide 20 MG tablet  Commonly known as:  LASIX  Take 20 mg by mouth as needed for fluid or edema.     hydrocortisone 2.5 % cream  Apply 1 application topically as needed (rash).     HYDROmorphone 4 MG tablet  Commonly known as:  DILAUDID  Take 1-2 tablets (4-8 mg total) by mouth every 6 (six) hours as needed for moderate pain or severe pain.     hyoscyamine 0.375 MG 12 hr tablet  Commonly known as:  LEVBID  Take 0.375 mg by mouth daily.     lidocaine-prilocaine cream  Commonly known as:  EMLA  Apply to affected area once     meloxicam 15 MG tablet  Commonly known as:  MOBIC  Take 15 mg by mouth daily.     methocarbamol 500 MG tablet  Commonly known as:  ROBAXIN  Take 2 tablets (1,000 mg total) by mouth every 6 (six) hours as needed for muscle spasms.     nebivolol 10 MG tablet  Commonly known as:  BYSTOLIC  Take 10 mg by mouth at bedtime.     Normal Saline Flush 0.9 % Soln  Place 5 mLs  rectally daily as needed (tube flush).  ondansetron 8 MG tablet  Commonly known as:  ZOFRAN  Take 1 tablet (8 mg total) by mouth 2 (two) times daily. Start the day after chemo for 2 days. Then take as needed for nausea or vomiting.     promethazine 25 MG tablet  Commonly known as:  PHENERGAN  Take 25 mg by mouth every 6 (six) hours as needed for nausea or vomiting.     rosuvastatin 5 MG tablet  Commonly known as:  CRESTOR  Take 5 mg by mouth at bedtime.     traMADol 50 MG tablet  Commonly known as:  ULTRAM  Take 50 mg by mouth every 4 (four) hours as needed. pain     Vitamin D (Ergocalciferol) 50000 units Caps capsule  Commonly known as:  DRISDOL  Take 50,000 Units by mouth 2 (two) times a week.           Follow-up Information    Follow up with Param Capri C., MD. Schedule an appointment as soon as possible for a visit in 2 weeks.   Specialty:  General Surgery   Why:  To follow up after your hospital stay   Contact information:   [address] [place] [address] [phone]        Signed: Morton Peters, M.D., F.A.C.S. Gastrointestinal and Minimally Invasive Surgery [facility] [address], [place], [place] [address] [phone] Main / Paging   09/17/2015, 8:06 AM

## 2015-09-17 NOTE — Progress Notes (Signed)
Pt. Right chest port a cath beeping occluded this morning, attempted to flush line, but very difficult. IV team consult ordered. Pt. requested port be de accessed after IV team tried to TPA site. Pt. States she will follow up with her MD in the morning. States she is done with chemo so they may decide to take it out.

## 2015-09-17 NOTE — Discharge Instructions (Signed)
DRAIN CARE:   You have a closed bulb drain to help you heal.    A bulb drain is a small, plastic reservoir which creates a gentle suction. It is used to remove excess fluid from a surgical wound. The color and amount of fluid will vary. Immediately after surgery, the fluid is bright red. It may gradually change to a yellow color. When the amount decreases to about 1 or 2 tablespoons (15 to 30 cc) per 24 hours, your caregiver will usually remove it.  JP Care  The Jackson-Pratt drainage system has flexible tubing attached to a soft, plastic bulb with a stopper. The drainage end of the tubing, which is flat and white, goes into your body through a small opening near your incision (surgical cut). A stitch holds the drainage end in place. The rest of the tube is outside your body, attached to the bulb. When the bulb is compressed with the stopper in place, it creates a vacuum. This causes a constant gentle suction, which helps draw out fluid that collects under your incision. The bulb should be compressed at all times, except when you are emptying the drainage.  How long you will have your Jackson-Pratt depends on your surgery and the amount of fluid is draining. This is different for everyone. The Jackson-Pratt is usually removed when the drainage is 30 mL or less over 24 hours. To keep track of how much drainage youre having, you will record the amount in a drainage log. Its important to bring the log with you to your follow-up appointments.  Caring for Your Jackson-Pratt at Home In order to care for your Jackson-Pratt at home, you or your caregiver will do the following:  Empty the drain once a day and record the color and amount of drainage  Care for the area where the tubing enters your skin by washing with soap and water.  Milk the tubing to help move clots into the bulb.  Do this before you empty and measure your drainage. Look in the mirror at the tubing. This will help you see where your  hands need to be. Pinch the tubing close to where it goes into your skin between your thumb and forefinger. With the thumb and forefinger of your other hand, pinch the tubing right below your other fingers. Keep your fingers pinched and slide them down the tubing, pushing any clots down toward the bulb. You may want to use alcohol swabs to help you slide your fingers down the tubing. Repeat steps 3 and 4 as necessary to push clots from the tubing into the bulb. If you are not able to move a clot into the bulb, call your doctors office. The fluid may leak around the insertion site if a clot is blocking the drainage flow. If there is fluid in the bulb and no leakage at the insertion site, the drain is working.  How to Empty Your Jackson-Pratt and Record the Drainage You will need to empty your Jackson-Pratt every day  Gather the following supplies:  Measuring container your nurse gave you Jackson-Pratt Drainage Record  Pen or pencil  Instructions Clean an area to work on. Clean your hands thoroughly. Unplug the stopper on top of your Jackson-Pratt. This will cause the bulb to expand. Do not touch the inside of the stopper or the inner area of the opening on the bulb. Turn your Jackson-Pratt upside down, gently squeeze the bulb, and pour the drainage into the measuring container. Turn your Jackson-Pratt right  side up. Squeeze the bulb until your fingers feel the palm of your hand. Keep squeezing the bulb while you replug the stopper. Make sure the bulb stays fully compressed to ensure constant, gentle suction.    Check the amount and color of drainage in the measuring container. The first couple days after surgery the fluid may be dark red. This is normal. As you heal the fluid may look pink or pale yellow. Record this amount and the color of drainage on your Jackson-Pratt Drainage Record. Flush the drainage down the toilet and rinse the measuring container with water.  Caring for the  Insertion Site Once you have emptied the drainage, clean your hands again. Check the area around the insertion site. Look for tenderness, swelling, or pus. If you have any of these, or if you have a temperature of 101 F (38.3 C) or higher, you may have an infection. Call your doctors office.  Sometimes, the drain causes redness the size of a dime at your insertion site. This is normal. Your healthcare provider will tell you if you should place a bandage over the insertion site.      DAILY CARE  Keep the bulb compressed at all times, except while emptying it. The compression creates suction.   Keep sites where the tubes enter the skin dry and covered with a light bandage (dressing).   Tape the tubes to your skin, 1 to 2 inches below the insertion sites, to keep from pulling on your stitches. Tubes are stitched in place and will not slip out.   Pin the bulb to your shirt (not to your pants) with a safety pin.   For the first few days after surgery, there usually is more fluid in the bulb. Empty the bulb whenever it becomes half full because the bulb does not create enough suction if it is too full. Include this amount in your 24 hour totals.   When the amount of drainage decreases, empty the bulb at the same time every day. Write down the amounts and the 24 hour totals. Your caregiver will want to know them. This helps your caregiver know when the tubes can be removed.   (We anticipate removing the drain in 1-3 weeks, depending on when the output is <41m a day for 2+ days)  If there is drainage around the tube sites, change dressings and keep the area dry. If you see a clot in the tube, leave it alone. However, if the tube does not appear to be draining, let your caregiver know.  TO EMPTY THE BULB  Open the stopper to release suction.   Holding the stopper out of the way, pour drainage into the measuring cup that was sent home with you.   Measure and write down the amount. If there  are 2 bulbs, note the amount of drainage from bulb 1 or bulb 2 and keep the totals separate. Your caregiver will want to know which tube is draining more.   Compress the bulb by folding it in half.   Replace the stopper.   Check the tape that holds the tube to your skin, and pin the bulb to your shirt.  SEEK MEDICAL CARE IF:  The drainage develops a bad odor.   You have an oral temperature above 102 F (38.9 C).   The amount of drainage from your wound suddenly increases or decreases.   You accidentally pull out your drain.   You have any other questions or concerns.  MAKE  SURE YOU:   Understand these instructions.   Will watch your condition.   Will get help right away if you are not doing well or get worse.     Call our office if you have any questions about your drain. [phone]  Abscess An abscess is an infected area that contains a collection of pus and debris.It can occur in almost any part of the body. An abscess is also known as a furuncle or boil. CAUSES  An abscess occurs when tissue gets infected. This can occur from blockage of oil or sweat glands, infection of hair follicles, or a minor injury to the skin. As the body tries to fight the infection, pus collects in the area and creates pressure under the skin. This pressure causes pain. People with weakened immune systems have difficulty fighting infections and get certain abscesses more often.  SYMPTOMS Usually an abscess develops on the skin and becomes a painful mass that is red, warm, and tender. If the abscess forms under the skin, you may feel a moveable soft area under the skin. Some abscesses break open (rupture) on their own, but most will continue to get worse without care. The infection can spread deeper into the body and eventually into the bloodstream, causing you to feel ill.  DIAGNOSIS  Your caregiver will take your medical history and perform a physical exam. A sample of fluid may also be taken  from the abscess to determine what is causing your infection. TREATMENT  Your caregiver may prescribe antibiotic medicines to fight the infection. However, taking antibiotics alone usually does not cure an abscess. Your caregiver may need to make a small cut (incision) in the abscess to drain the pus. In some cases, gauze is packed into the abscess to reduce pain and to continue draining the area. HOME CARE INSTRUCTIONS   Only take over-the-counter or prescription medicines for pain, discomfort, or fever as directed by your caregiver.  If you were prescribed antibiotics, take them as directed. Finish them even if you start to feel better.  If gauze is used, follow your caregiver's directions for changing the gauze.  To avoid spreading the infection:  Keep your draining abscess covered with a bandage.  Wash your hands well.  Do not share personal care items, towels, or whirlpools with others.  Avoid skin contact with others.  Keep your skin and clothes clean around the abscess.  Keep all follow-up appointments as directed by your caregiver. SEEK MEDICAL CARE IF:   You have increased pain, swelling, redness, fluid drainage, or bleeding.  You have muscle aches, chills, or a general ill feeling.  You have a fever. MAKE SURE YOU:   Understand these instructions.  Will watch your condition.  Will get help right away if you are not doing well or get worse.   This information is not intended to replace advice given to you by your health care provider. Make sure you discuss any questions you have with your health care provider.   Document Released: 04/10/2005 Document Revised: 12/31/2011 Document Reviewed: 09/13/2011 Elsevier Interactive Patient Education 2016 [place].  Pelvic floor muscle training exercises ("Kegels") can help strengthen the muscles under the uterus, bladder, and bowel (large intestine). They can help both men and women who have problems with urine leakage  or bowel control.  A pelvic floor muscle training exercise is like pretending that you have to urinate, and then holding it. You relax and tighten the muscles that control urine flow. It's important to  find the right muscles to tighten.  The next time you have to urinate, start to go and then stop. Feel the muscles in your vagina, bladder, or anus get tight and move up. These are the pelvic floor muscles. If you feel them tighten, you've done the exercise right. If you are still not sure whether you are tightening the right muscles, keep in mind that all of the muscles of the pelvic floor relax and contract at the same time. Because these muscles control the bladder, rectum, and vagina, the following tips may help: Women: Insert a finger into your vagina. Tighten the muscles as if you are holding in your urine, then let go. You should feel the muscles tighten and move up and down.  Men: Insert a finger into your rectum. Tighten the muscles as if you are holding in your urine, then let go. You should feel the muscles tighten and move up and down. These are the same muscles you would tighten if you were trying to prevent yourself from passing gas.  It is very important that you keep the following muscles relaxed while doing pelvic floor muscle training exercises: Abdominal  Buttocks (the deeper, anal sphincter muscle should contract)  Thigh   A woman can also strengthen these muscles by using a vaginal cone, which is a weighted device that is inserted into the vagina. Then you try to tighten the pelvic floor muscles to hold the device in place. If you are unsure whether you are doing the pelvic floor muscle training correctly, you can use biofeedback and electrical stimulation to help find the correct muscle group to work. Biofeedback is a method of positive reinforcement. Electrodes are placed on the abdomen and along the anal area. Some therapists place a sensor in the vagina in women or anus in men  to monitor the contraction of pelvic floor muscles.  A monitor will display a graph showing which muscles are contracting and which are at rest. The therapist can help find the right muscles for performing pelvic floor muscle training exercises.   PERFORMING PELVIC FLOOR EXERCISES: 1. Begin by emptying your bladder. 2. Tighten the pelvic floor muscles and hold for a count of 10. 3. Relax the muscles completely for a count of 10. 4. Do 10 repititions, 3 to 5 times a day (morning, afternoon, and night). You can do these exercises at any time and any place. Most people prefer to do the exercises while lying down or sitting in a chair. After 4 - 6 weeks, most people notice some improvement. It may take as long as 3 months to see a major change. After a couple of weeks, you can also try doing a single pelvic floor contraction at times when you are likely to leak (for example, while getting out of a chair). A word of caution: Some people feel that they can speed up the progress by increasing the number of repetitions and the frequency of exercises. However, over-exercising can instead cause muscle fatigue and increase urine leakage. If you feel any discomfort in your abdomen or back while doing these exercises, you are probably doing them wrong. Breathe deeply and relax your body when you are doing these exercises. Make sure you are not tightening your stomach, thigh, buttock, or chest muscles. When done the right way, pelvic floor muscle exercises have been shown to be very effective at improving urinary continence.  Pelvic Floor Pain / Incontinence  Do you suffer from pelvic pain or incontinence? Do you  have pain in the pelvis, low back or hips that is associated with sitting, walking, urination or intercourse? Have you experienced leaking of urine or feces when coughing, sneezing or laughing? Do you have pain in the pelvic area associated with cancer?  These are conditions that are common with pelvic  floor muscle dysfunction. Over time, due to stress, scar tissue, surgeries and the natural course of aging, our muscles may become weak or overstressed and can spasm. This can lead to pain, weakness, incontinence or decreased quality of life.  Men and women with pelvic floor dysfunction frequently describe:  A falling out feeling. Pain or burning in the abdomen, tailbone or perineal area. Constipation or bowel elimination problems or difficulty initiating urination. Unresolved low back or hip pain. Frequency and urgency when going to the bathroom. Leaking of urine or feces. Pain with intercourse.  [email or web address]  To make a referral or for more information about [facility] Pelvic Floor Therapy Program, call  [facility]) - [place] [phone] [place]) - [facility]) - [phone]

## 2015-09-17 NOTE — Progress Notes (Signed)
IV Team Note;  Pt has right chest power port, accessed by IV Team "a couple nights ago because the floor nurse had trouble with it;"  Pt was ready for discharge home this am, but IVT was called because "the port won't flush;"  TPA was ordered;  Unable to instill much TPA;  Returned about 2 hours later; no blood return; unable to flush;  Offered to reaccess the pt's port, and to repeat TPA but pt and her husband were anxious to "go home;"  Pt stated she'd "call her doctor's office in the morning and let her know; this happened before and they TPA'd it at her office;"  Huber needle removed; gauze over site;  Site pink, no drainage; husband said "that's normal looking;"  RN at bedside during this time.  Raynelle Fanning RN IV Team

## 2015-09-18 ENCOUNTER — Other Ambulatory Visit (HOSPITAL_COMMUNITY): Payer: Self-pay | Admitting: Interventional Radiology

## 2015-09-18 ENCOUNTER — Other Ambulatory Visit: Payer: Self-pay | Admitting: Surgery

## 2015-09-18 DIAGNOSIS — N739 Female pelvic inflammatory disease, unspecified: Secondary | ICD-10-CM

## 2015-09-18 LAB — CULTURE, ROUTINE-ABSCESS: SPECIAL REQUESTS: NORMAL

## 2015-09-29 ENCOUNTER — Encounter: Payer: Self-pay | Admitting: Hematology

## 2015-09-29 ENCOUNTER — Other Ambulatory Visit (HOSPITAL_BASED_OUTPATIENT_CLINIC_OR_DEPARTMENT_OTHER): Payer: BLUE CROSS/BLUE SHIELD

## 2015-09-29 ENCOUNTER — Ambulatory Visit (HOSPITAL_BASED_OUTPATIENT_CLINIC_OR_DEPARTMENT_OTHER): Payer: BLUE CROSS/BLUE SHIELD | Admitting: Hematology

## 2015-09-29 ENCOUNTER — Ambulatory Visit (HOSPITAL_BASED_OUTPATIENT_CLINIC_OR_DEPARTMENT_OTHER): Payer: BLUE CROSS/BLUE SHIELD

## 2015-09-29 VITALS — BP 139/77 | HR 87 | Temp 98.4°F | Resp 18 | Ht 70.5 in | Wt 285.2 lb

## 2015-09-29 DIAGNOSIS — C20 Malignant neoplasm of rectum: Secondary | ICD-10-CM

## 2015-09-29 DIAGNOSIS — Z95828 Presence of other vascular implants and grafts: Secondary | ICD-10-CM

## 2015-09-29 DIAGNOSIS — D500 Iron deficiency anemia secondary to blood loss (chronic): Secondary | ICD-10-CM

## 2015-09-29 DIAGNOSIS — Z6841 Body Mass Index (BMI) 40.0 and over, adult: Secondary | ICD-10-CM

## 2015-09-29 DIAGNOSIS — K922 Gastrointestinal hemorrhage, unspecified: Secondary | ICD-10-CM

## 2015-09-29 DIAGNOSIS — F329 Major depressive disorder, single episode, unspecified: Secondary | ICD-10-CM

## 2015-09-29 DIAGNOSIS — D638 Anemia in other chronic diseases classified elsewhere: Secondary | ICD-10-CM

## 2015-09-29 DIAGNOSIS — E119 Type 2 diabetes mellitus without complications: Secondary | ICD-10-CM

## 2015-09-29 DIAGNOSIS — N739 Female pelvic inflammatory disease, unspecified: Secondary | ICD-10-CM

## 2015-09-29 DIAGNOSIS — F32A Depression, unspecified: Secondary | ICD-10-CM

## 2015-09-29 DIAGNOSIS — I10 Essential (primary) hypertension: Secondary | ICD-10-CM

## 2015-09-29 DIAGNOSIS — R630 Anorexia: Secondary | ICD-10-CM

## 2015-09-29 LAB — CBC WITH DIFFERENTIAL/PLATELET
BASO%: 1.2 % (ref 0.0–2.0)
Basophils Absolute: 0.1 10*3/uL (ref 0.0–0.1)
EOS%: 3.4 % (ref 0.0–7.0)
Eosinophils Absolute: 0.2 10*3/uL (ref 0.0–0.5)
HEMATOCRIT: 39.4 % (ref 34.8–46.6)
HGB: 12.3 g/dL (ref 11.6–15.9)
LYMPH#: 0.9 10*3/uL (ref 0.9–3.3)
LYMPH%: 13.3 % — ABNORMAL LOW (ref 14.0–49.7)
MCH: 26.5 pg (ref 25.1–34.0)
MCHC: 31.1 g/dL — AB (ref 31.5–36.0)
MCV: 85.4 fL (ref 79.5–101.0)
MONO#: 0.4 10*3/uL (ref 0.1–0.9)
MONO%: 6.4 % (ref 0.0–14.0)
NEUT#: 5.2 10*3/uL (ref 1.5–6.5)
NEUT%: 75.7 % (ref 38.4–76.8)
Platelets: 526 10*3/uL — ABNORMAL HIGH (ref 145–400)
RBC: 4.62 10*6/uL (ref 3.70–5.45)
RDW: 15.4 % — AB (ref 11.2–14.5)
WBC: 6.9 10*3/uL (ref 3.9–10.3)

## 2015-09-29 LAB — COMPREHENSIVE METABOLIC PANEL
ALK PHOS: 61 U/L (ref 40–150)
ALT: <9 U/L (ref 0–55)
ANION GAP: 10 meq/L (ref 3–11)
AST: 11 U/L (ref 5–34)
Albumin: 3.2 g/dL — ABNORMAL LOW (ref 3.5–5.0)
BUN: 10.8 mg/dL (ref 7.0–26.0)
CALCIUM: 10.3 mg/dL (ref 8.4–10.4)
CHLORIDE: 106 meq/L (ref 98–109)
CO2: 25 mEq/L (ref 22–29)
Creatinine: 0.8 mg/dL (ref 0.6–1.1)
EGFR: 87 mL/min/{1.73_m2} — AB (ref >90)
Glucose: 144 mg/dl — ABNORMAL HIGH (ref 70–140)
POTASSIUM: 3.7 meq/L (ref 3.5–5.1)
Sodium: 141 mEq/L (ref 136–145)
Total Bilirubin: 0.36 mg/dL (ref 0.20–1.20)
Total Protein: 7.9 g/dL (ref 6.4–8.3)

## 2015-09-29 LAB — IRON AND TIBC
%SAT: 21 % (ref 21–57)
IRON: 46 ug/dL (ref 41–142)
TIBC: 219 ug/dL — AB (ref 236–444)
UIBC: 173 ug/dL (ref 120–384)

## 2015-09-29 MED ORDER — SODIUM CHLORIDE 0.9% FLUSH
10.0000 mL | INTRAVENOUS | Status: DC | PRN
  Administered 2015-09-29: 10 mL via INTRAVENOUS
  Filled 2015-09-29: qty 10

## 2015-09-29 MED ORDER — HEPARIN SOD (PORK) LOCK FLUSH 100 UNIT/ML IV SOLN
500.0000 [IU] | Freq: Once | INTRAVENOUS | Status: AC
  Administered 2015-09-29: 500 [IU] via INTRAVENOUS
  Filled 2015-09-29: qty 5

## 2015-09-29 MED FILL — HYDROmorphone HCL 4 MG TABS: 4 | 10 days supply | Qty: 40 | Fill #0

## 2015-09-29 NOTE — Progress Notes (Signed)
[facility]  [phone] [phone]  Clinic Follow Up Note   Patient Care Team: [name], MD as PCP - General (Family Medicine) Michael Boston, MD as Consulting Physician (General Surgery) Truitt Merle, MD as Consulting Physician (Medical Oncology) Kyung Rudd, MD as Consulting Physician (Radiation Oncology) Teena Irani, MD as Consulting Physician (Gastroenterology) 09/29/2015  CHIEF COMPLAINTS:  Follow up rectal cancer   Oncology History   Rectal adenocarcinoma   Staging form: Colon and Rectum, AJCC 7th Edition     Clinical: T3, N2, M0 - Unsigned       Rectal adenocarcinoma s/p LAR resection 05/25/2015   01/13/2015 Initial Diagnosis Rectal adenocarcinoma   01/13/2015 Procedure Colonoscopy showed a sensitivity O nonobstructing mass in the rectum and from 12-18 cm proximal to the Annis. The mass was circumferential, measuring about 6 cm in length. EGD was negative.   01/17/2015 Tumor Marker CEA 1.4, CA-19-9 8, MMR normal    01/18/2015 Procedure Lower endoscopic ultrasound by Dr. Paulita Fujita showed a T3 N2 rectal mass.   01/20/2015 Imaging CT abdomen and pelvis with contrast showed right lateral rectal wall exophytic mass, and 2 ill-defined hepatic hypoenhancing lesions which appears to correspond to the lesion seen on the prior MRI in 2015.   02/05/2015 Imaging abdomen MRI showed 2 hemangioma, no suspicion for metastatic disease. CT chest was negative   02/20/2015 - 03/30/2015 Radiation Therapy neoadjuvant RT to rectal cancer    02/20/2015 Concurrent Chemotherapy capecitabine 2500 mg in the morning and 2000 mg in the evening (845m/m2, bid), on the day of radiation.    05/25/2015 Surgery Recto-sigmoid segmental resection, margins are negative    05/25/2015 Pathology Results 0.2cm residual invasive adenocarcinoma, G2, negative margins, 12 nodes were negative    06/23/2015 - 06/29/2015 Hospital Admission Patient was admitted for pelvic abscess, drain placed and she was  treated with IV antibiotics, she also received 1 dose of Feraheme for anemia.   09/15/2015 - 09/17/2015 Hospital Admission Recurrent pelvic collection s/p perc drainage 09/16/2015    HISTORY OF PRESENTING ILLNESS:  Lindsay CORBRIDGE47y.o. female is here because of evaluation for management for newly diagnosed rectal adenocarcinoma.   On 09/06/13, she presented to Dr. HStarr SinclairGI] with 480-monthistory of diarrhea and worsening abdominal pain. She has a history of chronic abdominal pain and had been previously diagnosed [place] by Dr. Ha[place]medeo PlentyAbdominal [place]/6/15 noted to have some hypoechoicity in the left hepatic lobe which was confirmed on MRI to be two hemangiomas  in the right hepatic lobe of measuring up to 3.0 cm along with an additional 7 mm probable cyst.   She presented with worsening of diarrhea for 6 month and was reevaluated by Dr. HaAmedeo PlentyShe was seen on 12/06/14 at which time it was decided she would undergo HIDA scan given that the pain was intermittently in the RUQ in addition to upper and lower endoscopy. HIDA scan 12/15/14 was reassuring, but she was found to have a malignant tumor of the rectum about 12-18 cm proximal to the anus, gastritis, gastric polyps on endoscopies 01/13/15. Biopsies for these three specimens along with the duodenum were collected and resulted on 01/17/15 for invasive adenocarcinoma, chronic gastritis [H. Pylori negative], fundic gland polyps, and non-specific mild patchy intra-epithelial lymphocytes. Endoscopic [place]/6/16 staged the mass at T3N2Mx and she is here today for consideration of neoadjuvant therapy.    Today, she does  report abdominal pain but is no different than her baseline [right-sided, upper, lower] but has had worsening appetite over the last 2 months. She does have diarrhea with bowel movements every 1-2 hours, especially after eating, and has been noticing bleeding with her  bowel movement following her endoscopic evaluation. She also feels her energy level has decreased. Otherwise, she denies nausea, chest pain, shortness of breath.    Family history is notable for a maternal uncle who had pancreatic cancer diagnosed in his mid to late 49s that spread to the colon and prostate before he died about 83 years later and a sister with non-melanoma skin cancer. 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 She smoked roughly 20 pack years [1 pack/week x 7 years] in the past and reports occasional alcohol use but denies any prior illicit drug use.  She lost about 7 lbs in the past 2 months  CURRENT THERAPY: observation, recovering from surgery    [place] returns for follow-up. She was admitted to hospital for recurrent pelvic fluid collection on 09/15/2015.  She had a cutaneous drainage tube placed on next day. And discharged home. She complains of significant pain at a the right buttock at the drainage tube site and fatigue.  Her appetite is low, she is trying to drink protein shake. She has completed a course of antibiotics. No other new complaints.   HISTORY:  Past Medical History  Diagnosis Date  . Hypertension   . Hypercholesteremia   . Obesity   . Tear of left rotator cuff   . IBS (irritable bowel syndrome)   . Vertigo   . TMJ (dislocation of temporomandibular joint)   . Bulging disc     L5  . Depression   . Rectal bleeding   . Vitamin D deficiency   . Osteoarthritis, knee   . S/P radiation therapy 02/20/15-03/30/15    colon/rectal  . Complication of anesthesia   . PONV (postoperative nausea and vomiting)   . History of mitral valve prolapse   . Mild sleep apnea     does not use CPAP  . History of chemotherapy   . Numbness and tingling     great toe bilat left more than right   . History of frequent ear infections   . History of frequent urinary tract infections     middle school age   . GERD (gastroesophageal reflux disease)   . Cancer ([place])   . Anemia   . History of blood transfusion   . Fibroids   . Cystadenoma of right ovary s/p RSO 05/25/2015 06/28/2015    SURGICAL HISTORY: Past Surgical History  Procedure Laterality Date  . Rotator cuff repair    . Eus N/A 01/18/2015    Procedure: LOWER ENDOSCOPIC ULTRASOUND (EUS);  Surgeon: Arta Silence, MD;  Location: [facility];  Service: Endoscopy;  Laterality: N/A;  . Abdominal hysterectomy  1996    Removed uterus, and tubes  . Left knee arthroscopy      mult  . Tonsillectomy    . Dilation and curettage of uterus      times 2  . Xi robotic assisted lower anterior resection N/A 05/25/2015    Procedure: XI ROBOTIC ASSISTED LOWER ANTERIOR RESECTION, , RIGID PROCTOSCOPY, RIGHT OOPHORECTOMY;  Surgeon: Michael Boston, MD;  Location: [facility];  Service: General;  Laterality: N/A;  . Portacath placement N/A 05/25/2015    Procedure: INSERTION PORT-A-CATH;  Surgeon: Remo Lipps  Gross, MD;  Location: [facility];  Service: General;  Laterality: N/A;    SOCIAL HISTORY: Social History   Social History  . Marital Status: Married    Spouse Name: N/A  . Number of Children: N/A  . Years of Education: N/A   Occupational History  . Not on file.   Social History Main Topics  . Smoking status: Former Smoker -- 0.50 packs/day for 4 years    Types: Cigarettes    Quit date: 07/15/2000  . Smokeless tobacco: Never Used     Comment: 1 pack/week intermittently x 7 years  . Alcohol Use: 0.6 oz/week    1 Shots of liquor per week     Comment: rarely   . Drug Use: No  . Sexual Activity: Not on file   Other Topics Concern  . Not on file   Social History Narrative   Tobacco Use: Cigarettes - Former Smoker   Alcohol: Yes, very rare, liquor.    No recreational drug use   Occupation: Head CMA @ [employer]   Marital Status: Married    Husband: Roselyn Reef Disabled   Children: 2 adopted kids [place]   Religion: [employer] in  [place] HISTORY: Family History  Problem Relation Age of Onset  . Coronary artery disease Mother 37  . Hypertension Mother   . Hyperlipidemia Mother   . Diabetes Mellitus I Mother   . Coronary artery disease Father   . Hyperlipidemia Father   . Hypertension Father   . Cancer Sister     skin - non melanoma  . Hyperlipidemia Brother   . Cancer Maternal Uncle 4    pancreatic with mets to colon and prostate  . Cirrhosis Maternal Uncle   . Hypertension Maternal Grandmother   . Diabetes Mellitus I Maternal Grandmother   . Hyperlipidemia Maternal Grandmother   . CVA Maternal Grandmother   . Hypertension Maternal Grandfather   . Coronary artery disease Maternal Grandfather 65  . Hyperlipidemia Maternal Grandfather   . Coronary artery disease Paternal Grandmother   . Hypertension Paternal Grandmother   . Hyperlipidemia Paternal Grandmother   . Diabetes Mellitus I Paternal Grandmother   . Hypertension Paternal Grandfather   . Hyperlipidemia Paternal Grandfather   . Coronary artery disease Paternal Grandfather     ALLERGIES:  is allergic to caine-1; oxycodone; penicillins; and sulfa antibiotics.  MEDICATIONS:  Current Outpatient Prescriptions  Medication Sig Dispense Refill  . Coenzyme Q10 (COQ-10) 100 MG CAPS Take 100 mg by mouth daily.    . DULoxetine (CYMBALTA) 60 MG capsule Take 60 mg by mouth at bedtime.     Marland Kitchen esomeprazole (NEXIUM) 40 MG capsule Take 40 mg by mouth 2 (two) times daily as needed (heartburn/ acid reflux).     . furosemide (LASIX) 20 MG tablet Take 20 mg by mouth as needed for fluid or edema.     . hydrocortisone 2.5 % cream Apply 1 application topically as needed (rash).     Marland Kitchen HYDROmorphone (DILAUDID) 4 MG tablet Take 1-2 tablets (4-8 mg total) by mouth every 6 (six) hours as needed for moderate pain or severe pain. 40 tablet 0  . hyoscyamine (LEVBID) 0.375 MG 12 hr tablet Take 0.375 mg by mouth daily.     Marland Kitchen  lidocaine-prilocaine (EMLA) cream Apply to affected area once (Patient taking differently: Apply 1 application topically daily as needed (port access).  Apply to affected area once) 30 g 3  . meloxicam (MOBIC) 15 MG tablet Take 15 mg by mouth daily.    . methocarbamol (ROBAXIN) 500 MG tablet Take 2 tablets (1,000 mg total) by mouth every 6 (six) hours as needed for muscle spasms. 20 tablet 3  . nebivolol (BYSTOLIC) 10 MG tablet Take 10 mg by mouth at bedtime.     . ondansetron (ZOFRAN) 8 MG tablet Take 1 tablet (8 mg total) by mouth 2 (two) times daily. Start the day after chemo for 2 days. Then take as needed for nausea or vomiting. 30 tablet 1  . promethazine (PHENERGAN) 25 MG tablet Take 25 mg by mouth every 6 (six) hours as needed for nausea or vomiting.    . rosuvastatin (CRESTOR) 5 MG tablet Take 5 mg by mouth at bedtime.     . Sodium Chloride Flush (NORMAL SALINE FLUSH) 0.9 % SOLN Place 5 mLs rectally daily as needed (tube flush).   0  . traMADol (ULTRAM) 50 MG tablet Take 50 mg by mouth every 4 (four) hours as needed. pain  2  . Vitamin D, Ergocalciferol, (DRISDOL) 50000 UNITS CAPS capsule Take 50,000 Units by mouth 2 (two) times a week.     No current facility-administered medications for this visit.    REVIEW OF SYSTEMS:   Constitutional: Denies fevers, chills or abnormal night sweats Eyes: Denies blurriness of vision, double vision or watery eyes Ears, nose, mouth, throat, and face: Denies mucositis or sore throat Respiratory: Denies cough, dyspnea or wheezes Cardiovascular: Denies palpitation, chest discomfort or lower extremity swelling Gastrointestinal:(+) abdominal pain  Skin: Denies abnormal skin rashes Lymphatics: Denies new lymphadenopathy or easy bruising Neurological:Denies numbness, tingling or new weaknesses Behavioral/Psych: Mood is stable, no new changes  All other systems were reviewed with the patient and are negative.  PHYSICAL EXAMINATION: ECOG PERFORMANCE  STATUS: 3  Filed Vitals:   09/29/15 1452  BP: 139/77  Pulse: 87  Temp: 98.4 F (36.9 C)  Resp: 18   Filed Weights   09/29/15 1452  Weight: 285 lb 3.2 oz (129.366 kg)    GENERAL:alert, no distress and comfortable SKIN: skin color, texture, turgor are normal, no rashes or significant lesions EYES: normal, conjunctiva are pink and non-injected, sclera clear OROPHARYNX:no exudate, no erythema and lips, buccal mucosa, and tongue normal  NECK: supple, thyroid normal size, non-tender, without nodularity LYMPH:  no palpable lymphadenopathy in the cervical, axillary or inguinal LUNGS: clear to auscultation and percussion with normal breathing effort HEART: regular rate & rhythm and no murmurs and no lower extremity edema ABDOMEN:abdomen soft, normal bowel sounds. No organomegaly,  Multiple surgical incision scar are well healed. (+) percutaneous drainage tube in the right buttock, brownish cloudy liquid in the drainage bag. Musculoskeletal:no cyanosis of digits and no clubbing  PSYCH: alert & oriented x 3 with fluent speech NEURO: no focal motor/sensory deficits   LABORATORY DATA:  I have reviewed the data as listed CBC Latest Ref Rng 09/29/2015 09/16/2015 08/03/2015  WBC 3.9 - 10.3 10e3/uL 6.9 5.3 6.0  Hemoglobin 11.6 - 15.9 g/dL 12.3 10.5(L) 11.3(L)  Hematocrit 34.8 - 46.6 % 39.4 34.2(L) 36.4  Platelets 145 - 400 10e3/uL 526(H) 299 463(H)    CMP Latest Ref Rng 09/29/2015 09/16/2015 08/03/2015  Glucose 70 - 140 mg/dl 144(H) 116(H) 143(H)  BUN 7.0 - 26.0 mg/dL 10.8 8 7.2  Creatinine 0.6 - 1.1 mg/dL 0.8 0.48 0.7  Sodium 136 - 145 mEq/L 141 142 141  Potassium 3.5 -  5.1 mEq/L 3.7 4.2 3.5  Chloride 101 - 111 mmol/L - 108 -  CO2 22 - 29 mEq/L 25 27 26   Calcium 8.4 - 10.4 mg/dL 10.3 9.2 10.5(H)  Total Protein 6.4 - 8.3 g/dL 7.9 6.2(L) 8.0  Total Bilirubin 0.20 - 1.20 mg/dL 0.36 0.5 0.39  Alkaline Phos 40 - 150 U/L 61 54 68  AST 5 - 34 U/L 11 16 23   ALT 0 - 55 U/L <9 16 13    PATHOLOGY  REPORT Diagnosis 05/25/2015 1. Colon, segmental resection for tumor, recto-sigmoid INVASIVE COLONIC ADENOCARCINOMA (0.2 CM IN GREATEST DIMENSION RESIDUAL TUMOR) THE TUMOR INVADE SUBMUCOSA (0.3 CM IN DEPTH, PT1) MARGINS OF RESECTION ARE NEGATIVE FOR TUMOR TWELVE BENIGN LYMPH NODES (0/12) 2. Colon, resection margin (donut), proximal anastomotic ring BENIGN COLONIC TISSUE, NEGATIVE FOR MALIGANCY 3. Colon, resection margin (donut), distal anastomotic ring BENIGN COLONIC TISSUE, NEGATIVE FOR MALIGNANCY 4. Ovary, right BENIGN SEROUS CYSTADENOMA.  Microscopic Comment 1. COLON AND RECTUM (INCLUDING TRANS-ANAL RESECTION): Specimen: Colon-rectum Procedure: Segmental resection Tumor site: Rectum anterior wall mid to distal third of rectum Specimen integrity: intact Macroscopic intactness of mesorectum: Complete: _ Macroscopic tumor perforation: Negative Invasive tumor: Maximum size: Histologic type(s): G2 Histologic grade and differentiation: G1: well differentiated/low grade G2: moderately differentiated/low grade G3: poorly differentiated/high grade G4: undifferentiated/high grade Type of polyp in which invasive carcinoma arose: _ Microscopic extension of invasive tumor:Submucosa Lymph-Vascular invasion: Not identified Peri-neural invasion: negative Tumor deposit(s) (discontinuous extramural extension): Negative Resection margins: Proximal margin: Negative Distal margin: Negative Circumferential (radial) (posterior ascending, posterior descending; lateral and posterior mid-rectum; and entire lower 1/3 rectum):Negatvie Mesenteric margin (sigmoid and transverse): Negative Distance closest margin (if all above margins negative): 2.5 cm Treatment effect (neo-adjuvant therapy): Present Additional polyp(s): Negative Non-neoplastic findings: Unremarkable Lymph nodes: number examined 12; number positive: 0 Pathologic Staging: T1, N0, M_ Ancillary studies: Preserved expression of the  makor and minor MMR proteins MLH1, MSH2, MSH6 and PMS2. MLH1:    RADIOGRAPHIC STUDIES: I have personally reviewed the radiological images as listed and agreed with the findings in the report.  Ct Abdomen Pelvis W Contrast 01/20/2015    IMPRESSION: Right lateral rectal wall exophytic mass likely corresponding to the known neoplasm.  Two ill-defined hepatic hypo enhancing lesions which appears to correspond to the lesions seen on the prior MRI. Please note evaluation of the liver is very limited due to streak artifact. If there is clinical concern for new metastatic disease MRI may provide better evaluation.  Small left ovarian cystic lesion corresponding to the lesion reported on the prior pelvic ultrasound. Ultrasound may provide better evaluation of the pelvic structures.   Electronically Signed   By: Anner Crete M.D.   On: 01/20/2015 16:15   Lower endoscopic ultrasound by Dr. Paulita Fujita 01/18/2015 FINDINGS: Normal digital rectal exam; could not palpate mass. In proximal rectum extending to the distal rectum, from 12 cm to 20 cm from the anal verge, a partial circumferential (50-75% circumferential distally, 75-100% circumferential proximall) mass was seen. Mass was firm, fixed, ulcerated, friable. EUS radial scope was able to traverse the tumor into normal-appearing distal sigmoid colon. Lesion invaded through the wall of the rectum into neighboring connective tissues in multiple regions. There were several round hypoechoic well-defined malignant-appearing lymph nodes in the vicinity of the proximal portion of the tumor. STAGING: T3 N2 Mx via rectal ultrasound ENDOSCOPIC IMPRESSION: As above. Rectosigmoid mass, biopsy-proven adenocarcinoma, with local invasion noted under ultrasound. RECOMMENDATIONS: 1. Watch for potential complications of procedure. 2. Needs CT chest, abdomen,  pelvis to complete staging. 3. Based already on rectal ultrasound results, patient would not be candidate for upfront  surgery. She will need surgical and radiation oncology consultations. 4. Will discuss with Dr. Amedeo Plenty.  CT chest 02/07/2015 IMPRESSION: 1. No findings of metastatic disease to the chest.  Abdomen MRI w and wo contrast 02/05/2015  IMPRESSION: 1. Small cavernous hemangiomas in segments 5 and 7 of the liver redemonstrated, as above. Additional 7 mm indeterminate lesion is also favored to represent a tiny cavernous hemangioma, but may alternatively represent a small cyst. No new suspicious appearing hepatic lesions to suggest presence of metastatic disease at this time. 2. Mild hepatic steatosis.   ASSESSMENT & PLAN: Lindsay Little is a 50 year old female with chronic abdominal pain with rectal mass on colonoscopy found to be invasive rectal adenocarcinoma.   1. T3N2M0, Stage IIIC rectal adenocarcinoma, ypT1N0 after neoadjuvant chemotherapy and radiation -I previously reviewed her  Surgical pathology result in great detail with patient and her husband. She has had a good response to neoadjuvant chemoradiation, has a small residual tumor on the surgical specimen, no  Lymph node metastasis. -We discussed adjuvant chemotherapy, she is agreeable with FOLFOX. She has had port put in during her surgery.  -she has been dealing with her pelvic abscess, which is still being drained now,  Recently finished the course of antibiotics - her adjuvant chemotherapy was canceled due to the  Abscess and infection issue. - We'll continue her surveillance. She had a multiple CT of the abdomen and pelvis after surgery, which did not  Show evidence of recurrence. I'll obtain a restaging CT including  Chest in July.  2. Pelvic abscess -This is being managed by her surgeon Dr. Johney Maine, and IR -she still has a  Percutaneous draining tube  3. Microcytic anemia, secondary to GI blood loss and iron deficiency anemia -she responded to IV for him very well. Her hemoglobin dropped to 6.4 when she was in the hospital a  months ago, now has improved to 11.3 after 2 doses of IV Feraheme - repeated iron study today showed normal serum iron and saturation. Her TIBC is low, likely secondary to abscess.   4. HTN, depression -She'll continue follow-up with her primary care physician  6.  Anorexia and depression - Continue mirtazapine - I encouraged her to take nutrition supplement   Plan: - for flush in 6 weeks with lab - I'll see her back in 3 months with lab, we'll order restaging CT scan on her next visit.   I spent 30 mins for the visit, >50% face to face discussion.   Truitt Merle  09/29/2015

## 2015-09-29 NOTE — Patient Instructions (Signed)

## 2015-10-03 ENCOUNTER — Ambulatory Visit
Admission: RE | Admit: 2015-10-03 | Discharge: 2015-10-03 | Disposition: A | Discharge status: Home / Self Care | Payer: BLUE CROSS/BLUE SHIELD | Source: Ambulatory Visit | Attending: Surgery | Admitting: Surgery

## 2015-10-03 ENCOUNTER — Ambulatory Visit
Admission: RE | Admit: 2015-10-03 | Discharge: 2015-10-03 | Disposition: A | Discharge status: Home / Self Care | Payer: BLUE CROSS/BLUE SHIELD | Source: Ambulatory Visit | Attending: Interventional Radiology | Admitting: Interventional Radiology

## 2015-10-03 DIAGNOSIS — N739 Female pelvic inflammatory disease, unspecified: Secondary | ICD-10-CM

## 2015-10-03 MED ORDER — IOPAMIDOL (ISOVUE-300) INJECTION 61%
125.0000 mL | Freq: Once | INTRAVENOUS | Status: AC | PRN
  Administered 2015-10-03: 125 mL via INTRAVENOUS

## 2015-10-03 NOTE — Progress Notes (Signed)
Referring Physician(s): Dr Neysa Bonito  Chief Complaint: The patient is seen in follow up today s/p 09/16/2015: Successful CT guided placement of a 12 French all purpose drain catheter into the midline of the lower pelvis via right trans gluteal approach with aspiration of 80 mL of feculent material.  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 New drain placed 3/4 /17  Now for recheck CT scan today. Pt states she is flushing drain 2x/day with 5cc saline each time Painful to flush but flushes easily Finished Cipro BID yesterday Did have low grade temp yesterday; today afeb Output remains feculent   approx 5 cc daily; noted is output of similar feculent drainage from rectum daily She does have normal BM few times a week   Past Medical History  Diagnosis Date  . Hypertension   . Hypercholesteremia   . Obesity   . Tear of left rotator cuff   . IBS (irritable bowel syndrome)   . Vertigo   . TMJ (dislocation of temporomandibular joint)   . Bulging disc     L5  . Depression   . Rectal bleeding   . Vitamin D deficiency   . Osteoarthritis, knee   . S/P radiation therapy 02/20/15-03/30/15    colon/rectal  . Complication of anesthesia   . PONV (postoperative nausea and vomiting)   . History of mitral valve prolapse   . Mild sleep apnea     does not use CPAP  . History of chemotherapy   . Numbness and tingling     great toe bilat left more than right   .  History of frequent ear infections   . History of frequent urinary tract infections     middle school age  . GERD (gastroesophageal reflux disease)   . Cancer ([place])   . Anemia   . History of blood transfusion   . Fibroids   . Cystadenoma of right ovary s/p RSO 05/25/2015 06/28/2015    Past Surgical History  Procedure Laterality Date  . Rotator cuff repair    . Eus N/A 01/18/2015    Procedure: LOWER ENDOSCOPIC ULTRASOUND (EUS);  Surgeon: Arta Silence, MD;  Location: [facility];  Service: Endoscopy;  Laterality: N/A;  . Abdominal hysterectomy  1996    Removed uterus, and tubes  . Left knee arthroscopy      mult  . Tonsillectomy    . Dilation and curettage of uterus      times 2  . Xi robotic assisted lower anterior resection N/A 05/25/2015    Procedure: XI ROBOTIC ASSISTED LOWER ANTERIOR RESECTION, , RIGID PROCTOSCOPY, RIGHT OOPHORECTOMY;  Surgeon: Michael Boston, MD;  Location: [facility];  Service: General;  Laterality: N/A;  . Portacath placement N/A 05/25/2015    Procedure: INSERTION PORT-A-CATH;  Surgeon: Michael Boston, MD;  Location: [facility];  Service: General;  Laterality: N/A;    Allergies: HYQMV-7; Oxycodone; Penicillins; and Sulfa antibiotics  Medications: Prior to Admission medications   Medication Sig Start Date End Date Taking? Authorizing Provider  Coenzyme Q10 (COQ-10) 100 MG  CAPS Take 100 mg by mouth daily.    Historical Provider, MD  DULoxetine (CYMBALTA) 60 MG capsule Take 60 mg by mouth at bedtime.     Historical Provider, MD  esomeprazole (NEXIUM) 40 MG capsule Take 40 mg by mouth 2 (two) times daily as needed (heartburn/ acid reflux).     Historical Provider, MD  furosemide (LASIX) 20 MG tablet Take 20 mg by mouth as needed for fluid or edema.     Historical Provider, MD  hydrocortisone 2.5 % cream Apply 1 application topically as needed (rash).     Historical Provider, MD  HYDROmorphone (DILAUDID) 4 MG tablet Take 1-2 tablets (4-8 mg total) by mouth every 6 (six)  hours as needed for moderate pain or severe pain. 09/17/15   Michael Boston, MD  hyoscyamine (LEVBID) 0.375 MG 12 hr tablet Take 0.375 mg by mouth daily.     Historical Provider, MD  lidocaine-prilocaine (EMLA) cream Apply to affected area once Patient taking differently: Apply 1 application topically daily as needed (port access). Apply to affected area once 06/14/15   Truitt Merle, MD  meloxicam (MOBIC) 15 MG tablet Take 15 mg by mouth daily.    Historical Provider, MD  methocarbamol (ROBAXIN) 500 MG tablet Take 2 tablets (1,000 mg total) by mouth every 6 (six) hours as needed for muscle spasms. 09/17/15   Michael Boston, MD  nebivolol (BYSTOLIC) 10 MG tablet Take 10 mg by mouth at bedtime.     Historical Provider, MD  ondansetron (ZOFRAN) 8 MG tablet Take 1 tablet (8 mg total) by mouth 2 (two) times daily. Start the day after chemo for 2 days. Then take as needed for nausea or vomiting. 06/14/15   Truitt Merle, MD  promethazine (PHENERGAN) 25 MG tablet Take 25 mg by mouth every 6 (six) hours as needed for nausea or vomiting.    Historical Provider, MD  rosuvastatin (CRESTOR) 5 MG tablet Take 5 mg by mouth at bedtime.     Historical Provider, MD  Sodium Chloride Flush (NORMAL SALINE FLUSH) 0.9 % SOLN Place 5 mLs rectally daily as needed (tube flush).  07/05/15   Historical Provider, MD  traMADol (ULTRAM) 50 MG tablet Take 50 mg by mouth every 4 (four) hours as needed. pain 06/13/15   Historical Provider, MD  Vitamin D, Ergocalciferol, (DRISDOL) 50000 UNITS CAPS capsule Take 50,000 Units by mouth 2 (two) times a week.    Historical Provider, MD     Family History  Problem Relation Age of Onset  . Coronary artery disease Mother 37  . Hypertension Mother   . Hyperlipidemia Mother   . Diabetes Mellitus I Mother   . Coronary artery disease Father   . Hyperlipidemia Father   . Hypertension Father   . Cancer Sister     skin - non melanoma  . Hyperlipidemia Brother   . Cancer Maternal Uncle 76    pancreatic  with mets to colon and prostate  . Cirrhosis Maternal Uncle   . Hypertension Maternal Grandmother   . Diabetes Mellitus I Maternal Grandmother   . Hyperlipidemia Maternal Grandmother   . CVA Maternal Grandmother   . Hypertension Maternal Grandfather   . Coronary artery disease Maternal Grandfather 65  . Hyperlipidemia Maternal Grandfather   . Coronary artery disease Paternal Grandmother   . Hypertension Paternal Grandmother   . Hyperlipidemia Paternal Grandmother   . Diabetes Mellitus I Paternal Grandmother   . Hypertension Paternal Grandfather   . Hyperlipidemia Paternal Grandfather   . Coronary  artery disease Paternal Grandfather     Social History   Social History  . Marital Status: Married    Spouse Name: N/A  . Number of Children: N/A  . Years of Education: N/A   Social History Main Topics  . Smoking status: Former Smoker -- 0.50 packs/day for 4 years    Types: Cigarettes    Quit date: 07/15/2000  . Smokeless tobacco: Never Used     Comment: 1 pack/week intermittently x 7 years  . Alcohol Use: 0.6 oz/week    1 Shots of liquor per week     Comment: rarely   . Drug Use: No  . Sexual Activity: Not on file   Other Topics Concern  . Not on file   Social History Narrative   Tobacco Use: Cigarettes - Former Smoker   Alcohol: Yes, very rare, liquor.    No recreational drug use   Occupation: Head CMA @ [employer]   Marital Status: Married    Husband: Roselyn Reef Disabled   Children: 2 adopted kids [place]   Religion: [employer] in [place]                    Vital Signs: BP 130/74 mmHg  Pulse 105  Temp(Src) 98.2 F (36.8 C) (Oral)  SpO2 96%  Physical Exam  Abdominal: Soft.  Skin: Skin is warm and dry.  Site of drain is clean and dry NT no bleeding No sign of infection  Output scant in JP Feculent looking and smelling  Flushes easily without pain Aspirated feculent material  Nursing note and vitals reviewed.   Imaging: No results  found.  Labs:  CBC:  Recent Labs  06/28/15 1000 08/03/15 0933 09/16/15 0414 09/29/15 1424  WBC 6.3 6.0 5.3 6.9  HGB 6.7* 11.3* 10.5* 12.3  HCT 22.5* 36.4 34.2* 39.4  PLT 396 463* 299 526*    COAGS:  Recent Labs  06/24/15 0630 09/16/15 0414  INR 1.22 1.10    BMP:  Recent Labs  05/26/15 0552  06/23/15 2000 06/24/15 0630 06/26/15 0001 08/03/15 0933 09/16/15 0414 09/29/15 1424  NA 137  < >  --  132* 139 141 142 141  K 4.3  < >  --  3.2* 3.7 3.5 4.2 3.7  CL 106  --   --  99* 103  --  108  --   CO2 27  < >  --  25 26 26 27 25   GLUCOSE 115*  < >  --  94 103* 143* 116* 144*  BUN 10  < >  --  8 8 7.2 8 10.8  CALCIUM 9.1  < >  --  8.8* 9.0 10.5* 9.2 10.3  CREATININE 0.60  < > 0.62 0.59 0.48 0.7 0.48 0.8  GFRNONAA >60  --  >60 >60 >60  --  >60  --   GFRAA >60  --  >60 >60 >60  --  >60  --   < > = values in this interval not displayed.  LIVER FUNCTION TESTS:  Recent Labs  06/14/15 0829 08/03/15 0933 09/16/15 0414 09/29/15 1424  BILITOT <0.30 0.39 0.5 0.36  AST 10 23 16 11   ALT 12 13 16  <9  ALKPHOS 78 68 54 61  PROT 7.5 8.0 6.2* 7.9  ALBUMIN 2.7* 3.2* 3.0* 3.2*    Assessment:  Colon Ca 01/2015 Colon resection 05/2015 Abscess/ drain placed 06/2015 Persistent fistula to rectum 08/02/15 and 08/23/15 per injections  Removed 09/04/2015 in Dr  Gross office New pain and symptoms; new abscess with new drain placement 09/16/15 in IR  Now for CT scan and evaluation Dr Anselm Pancoast feels CT does show good position of drain catheter with only slight change in smaller abscess  Plan: drain to remain for now Pt to follow with Dr Johney Maine [place] 3/27 Await plan from [facility]   Signed: Vester Balthazor A 10/03/2015, 10:41 AM   Please refer to Dr. Anselm Pancoast attestation of this note for management and plan.

## 2015-10-10 MED FILL — HYDROmorphone HCL 4 MG TABS: 4 | 3 days supply | Qty: 40 | Fill #0

## 2015-10-11 ENCOUNTER — Other Ambulatory Visit: Payer: Self-pay | Admitting: Surgery

## 2015-10-11 NOTE — H&P (Signed)
Lindsay Little 10/10/2015 11:24 AM Location: [facility] Patient #: [id number] DOB: 12/25/1965 Married / Language: English / Race: White Female  History of Present Illness (Irving Bloor C. Browning [place] Little; 10/11/2015 7:31 AM) The patient is a 50 year old female who presents with colorectal cancer. Note for "Colorectal cancer": The patient is a 50 year old female who presents with colorectal cancer. The patient returns status post robotically-assisted low anterior resection for clinical T3 N1 rectal cancer and proximal/mid rectum. 05/25/2015. Downstaged [id number] (0/12 LN)  Patient struggled with postoperative diarrhea. Was really bothersome. It eventually got under better control. Appetite down but improving. Then started to feel worse. CT scan showed evidence of abscess cavity near the anastomosis strongly suspicious for leak. Admitted. IV antibiotics. Percutaneous drain placed 06/24/2015. Suspicious for control of anastomotic leak. Anemic. Got another dose of IV iron.  She comes today with her husband. She is feeling better. She denies any pelvic pain now. Flushes down to once a day since more frequent flushes caused irritation. Annoyed by the exit site and the smell from the drain. Usually mildly feculent output is 15-25 mL a day. No more diarrhea. 1- 2 soft well-formed bowel movements a day. Appetite better. Getting close to back to regular activity. Saw medical oncology. Holding off on post adjuvant therapy given the long recovery period and chemotherapy window closing.  LEFT drain study showed fistula much more narrow and subtle. Because it had been a stable low output fistula with a much smaller cavity and no evidence of internal opening or stricture any more, I went and removed the drain last month. Been 9 weeks since the drain & 13 weeks since surgery with improvement. Unfortunately, she had recurrent fecal urgency and some discomfort. Repeat CT scan showed recurrent  fistula. Drain replaced 09/16/2015. Follow-up drain study 3 weeks later showed some decrease in size.  She is miserable again. She is having a lot of fecal urgency. Sometimes feels she cannot hold it in. Not using the Imodium much despite prior recommendations. Having 6-8 bowel movements a day. Very sore. Very anxious again. Husband notes he's been trying to get her to call us. She's not wanted to be a bother, but she is pretty rough again.             POST-OPERATIVE DIAGNOSIS:   Proximal Rectal Cancer  SURGERY: 05/25/2015  XI ROBOTIC ASSISTED RECTOSIGMOID LOWER ANTERIOR RESECTION Laparoscopic and robotic lysis of adhesions X 60 MINUTES RIGID PROCTOSCOPY RIGHT SALPINGO-OOPHORECTOMY INSERTION PORT-A-CATH in R IJ  SURGEON:   Surgeon(s): Coryn Mosso, Little Alicia Thomas, Little  OR FINDINGS:  Patient had a bulky tumor in proximal and mid rectum. Most of it focused on the right anterolateral circumference. Still came down to around 9 cm from the anal verge.  I enlarged right ovary was some firm and mainly cystic areas. At least 6 cm in largest dimension. Left side looked atrophic and normal. Right ovary removed.  PowerPort is a ClearVUE 8 French. Reference #[id number] Lot number [id number] It is in the right internal jugular vein. The tip sits at the right atrial/superior vena caval junction  No obvious metastatic disease on visceral nor parietal peritoneum or liver.  The anastomosis rests 5-6 cm from the anal verge by rigid proctoscopy.    Diagnosis 1. Colon, segmental resection for tumor, recto-sigmoid INVASIVE COLONIC ADENOCARCINOMA (0.2 CM IN GREATEST DIMENSION RESIDUAL TUMOR) THE TUMOR INVADE SUBMUCOSA (0.3 CM IN DEPTH, PT1) MARGINS OF RESECTION ARE NEGATIVE FOR TUMOR TWELVE BENIGN LYMPH NODES (0/12) 2.   Colon, resection margin (donut), proximal anastomotic ring BENIGN COLONIC TISSUE, NEGATIVE FOR MALIGANCY 3. Colon, resection margin (donut), distal  anastomotic ring BENIGN COLONIC TISSUE, NEGATIVE FOR MALIGNANCY 4. Ovary, right BENIGN SEROUS CYSTADENOMA Microscopic Comment 1. COLON AND RECTUM (INCLUDING TRANS-ANAL RESECTION): Specimen: Colon-rectum Procedure: Segmental resection Tumor site: Rectum anterior wall mid to distal third of rectum Specimen integrity: intact Macroscopic intactness of mesorectum: Complete: _ Macroscopic tumor perforation: Negative Invasive tumor: Maximum size: Histologic type(s): G2 Histologic grade and differentiation: G1: well differentiated/low grade G2: moderately differentiated/low grade G3: poorly differentiated/high grade G4: undifferentiated/high grade Type of polyp in which invasive carcinoma arose: _ 1 of 3 Duplicate copy FINAL for MAIGAN, BITTINGER N [phone]) Microscopic Comment(continued) Microscopic extension of invasive tumor:Submucosa Lymph-Vascular invasion: Not identified Peri-neural invasion: negative Tumor deposit(s) (discontinuous extramural extension): Negative Resection margins: Proximal margin: Negative Distal margin: Negative Circumferential (radial) (posterior ascending, posterior descending; lateral and posterior mid-rectum; and entire lower 1/3 rectum):Negatvie Mesenteric margin (sigmoid and transverse): Negative Distance closest margin (if all above margins negative): 2.5 cm Treatment effect (neo-adjuvant therapy): Present Additional polyp(s): Negative Non-neoplastic findings: Unremarkable Lymph nodes: number examined 12; number positive: 0 Pathologic Staging: T1, N0, M_ Ancillary studies: Preserved expression of the makor and minor MMR proteins MLH1, MSH2, MSH6 and PMS2. MLH1: Lindsay Little Pathologist, Electronic Signature (Case signed 05/31/2015) Specimen Adyn Serna and Clinical Information Specimen(s) Obtained: 1. Colon, segmental resection for tumor, recto-sigmoid 2. Colon, resection margin (donut), proximal anastomotic ring 3. Colon, resection margin  (donut), distal anastomotic ring 4. Ovary, right Specimen Clinical Information 1. proximal rectal cancer [rd] Lindsay Little 1. Specimen: Rectosigmoid, to include at least portion of distal 1/3 of rectum. Specimen integrity: Intact Specimen length: 38 cm. 11 cm from sigmoid rectal junction to distal margin, and 4 cm from peritoneal reflection to distal margin. Mesorectal intactness: Complete Tumor location: Anterior wall, middle third to distal third of rectum Tumor size: There is a 4 cm in length and 2.2 cm in diameter area of healing ulceration with underlying indurated tan white dense tissue up to 1.1 cm thick. A discrete mass is not identified. Percent of bowel circumference involved: 40% Tumor distance to margins: Proximal: 28 cm Distal: 6 cm Radial (posterior ascending, posterior descending; lateral and posterior mid-rectum; and entire lower 1/3 rectum): The area of dense indurated tissue is 2.5 cm from the perirectal soft tissue margin. 2 of 3 Duplicate copy FINAL for MEIAH, ZAMUDIO [phone]) Gaila Engebretsen(continued) Macroscopic extent of tumor invasion: No definite mass to evaluate invasion Total presumed lymph nodes: Found are seventeen possible lymph nodes ranging from 0.4 to 1.3 cm Extramural satellite tumor nodules: None Mucosal polyp(s): None Additional findings: None Block summary: A = proximal margin B = distal margin C-F = sections of ulceration and underlying indurated tissue G-J = ulceration and underlying indurated tissue, with nearest serosa/peritoneal reflection. K = perirectal soft tissue margin nearest indurated tissue L = four possible nodes, whole M = four possible nodes, whole N = four possible nodes, whole O = four possible node, whole P = one node bisected Total = sixteen blocks. 2. Received in formalin is a 2 cm in diameter and 1 cm thick ring of tan to hyperemic smooth mucosa with underlying tissue with embedded suture material. Representative sections  are submitted in one block. 3. Received in formalin is a 1.4 cm in diameter and up to 1 cm thick ring of tan to hyperemic smooth mucosa and underlying tissue with embedded metallic staples. Representative sections submitted in one block. 4. Received in  formalin is a 5.1 x 4.4 x 3.4 cm ovary, clinically right, which has a tan pink to dark red exterior with scattered adhesions. There are also portions of attached tissue consistent with fimbria, however definite tube is not identified. On sectioning, there are several serous and mucoid filled cysts which are up to 4 cm in greatest dimension, and have smooth inner surfaces with no areas of excrescences. Tissue adjacent to the largest cyst is focally tan white, firm and dense. Representative sections are submitted in five blocks. (SSW:kh 05-26-15) Report signed out from the following location(s) Technical component and interpretation was performed at [facility] [address], [place], [place] [address]. CLIA #: [id number], 3 of 3 Duplicate copy   Problem List/Past Medical ( C , Little; 10/10/2015 11:32 AM) RECTAL ADENOCARCINOMA (C20) INCISIONAL HERNIA, INCARCERATED (K43.0) URINARY FREQUENCY (R35.0)  Other Problems ( C , Little; 10/10/2015 11:32 AM) Back Pain Diverticulosis Gastroesophageal Reflux Disease General anesthesia - complications High blood pressure Hypercholesterolemia Other disease, cancer, significant illness Rectal Cancer Transfusion history  Past Surgical History ( C , Little; 10/10/2015 11:32 AM) Colon Polyp Removal - Colonoscopy Hysterectomy (not due to cancer) - Partial Knee Surgery Left. Oral Surgery Shoulder Surgery Left.  Diagnostic Studies History ( C , Little; 10/10/2015 11:32 AM) Colonoscopy >10 years ago Mammogram 1-3 years ago Pap Smear 1-5 years ago  Allergies (Ashley Beck, CMA; 10/10/2015 11:24 AM) Lidocaine *CHEMICALS* All of the Caine's  except MARCAINE is ok per the pt Penicillin G Benzathine & Proc *PENICILLINS* Sulfabenzamide *CHEMICALS*  Medication History (Ashley Beck, CMA; 10/10/2015 11:25 AM) Dilaudid (4MG Tablet, 1 (one) Oral every six hours, as needed, Taken starting 09/29/2015) Active. Remeron (15MG Tablet, Oral at bedtime) Active. Clotrimazole (10MG Lozenge, Mouth/Throat) Active. DULoxetine HCl (60MG Capsule DR Part, Oral) Active. Esomeprazole Magnesium (40MG Capsule DR, Oral) Active. Furosemide (20MG Tablet, Oral) Active. Hydrochlorothiazide (25MG Tablet, Oral) Active. Hydrocodone-Acetaminophen (5-325MG Tablet, Oral) Active. Hyoscyamine Sulfate ER (0.375MG Tablet ER 12HR, Oral) Active. Meloxicam (15MG Tablet, Oral) Active. Crestor (5MG Tablet, Oral) Active. Bystolic (10MG Tablet, Oral) Active. Coenzyme Q10 (100MG Capsule, Oral) Active. Donnatal (16.2MG Tablet, Oral) Active. Robaxin (500MG Tablet, Oral) Active. Vitamin D (1000UNIT Tablet, Oral) Active. Medications Reconciled  Pregnancy / Birth History ( C , Little; 10/10/2015 11:32 AM) Age at menarche 12 years. Age of menopause 46-50 Gravida 4 Irregular periods Maternal age 15-20 Para 0    Vitals (Ashley Beck CMA; 10/10/2015 11:25 AM) 10/10/2015 11:25 AM Weight: 286 lb Height: 70.5in Body Surface Area: 2.44 m Body Mass Index: 40.46 kg/m  Temp.: 97.7F  Pulse: 86 (Regular)  BP: 130/68 (Sitting, Left Arm, Standard)      Physical Exam ( C.  Little; 10/10/2015 12:00 PM)  General Mental Status-Alert. General Appearance-Not in acute distress. Voice-Normal. Note: Relaxed. Nontoxic. Mildly anxious but consolable. Much less anxious and stressed today. Tearful at one point but consolable.  Integumentary Global Assessment Normal Exam - Distribution of scalp and body hair is normal. General Characteristics Overall Skin Surface - no rashes and no suspicious lesions.  Head and  Neck Head-normocephalic, atraumatic with no lesions or palpable masses. Face Global Assessment - atraumatic, no absence of expression. Neck Global Assessment - no abnormal movements, no decreased range of motion. Trachea-midline. Thyroid Gland Characteristics - non-tender.  Eye Eyeball - Left-Extraocular movements intact, No Nystagmus. Eyeball - Right-Extraocular movements intact, No Nystagmus. Upper Eyelid - Left-No Cyanotic. Upper Eyelid - Right-No Cyanotic.  Chest and Lung Exam Inspection Accessory muscles - No use of accessory muscles in   breathing.  Abdomen Note: Incisions with normal healing ridges. No guarding/rebound tenderness. Really no pain. No hernias. No drainage.  Female Genitourinary Note: No vaginal bleeding or discharge. Posterior rectovaginal septum intact without any fullness or induration. No obvious fistula. No mass. No ulceration.  Rectal Note: Drain with thinly feculent material. Again output 15-20mL a day. Sometimes a lot. Less today. Sensitive at exit site but no evidence of cellulitis.  Tolerates digital exam poorly. Anterior midline tag - moderate. Normal sphincter tone. EEA Anastomosis felt around 5 cm. Anterior 2/3 smooth with normal healing ridge. Posterior with ridge/shelf. Tenderness to palpation. However no stricturing. I feels some dimpling but no giant obvious opening. I can feel the pigtail drain pushing posteriorly.  Peripheral Vascular Upper Extremity Inspection - Left - Not Gangrenous, No Petechiae. Right - Not Gangrenous, No Petechiae.  Neurologic Neurologic evaluation reveals -normal attention span and ability to concentrate, able to name objects and repeat phrases. Appropriate fund of knowledge and normal coordination.  Neuropsychiatric Mental status exam performed with findings of-able to articulate well with normal speech/language, rate, volume and coherence and no evidence of hallucinations, delusions,  obsessions or homicidal/suicidal ideation. Orientation-oriented X3. Note: Less anxious / tearful. Depressed & restless  Musculoskeletal Global Assessment Gait and Station - normal gait and station.  Lymphatic General Lymphatics Description - No Generalized lymphadenopathy.    Assessment & Plan ( C.  Little; 10/11/2015 7:34 AM)  RECTAL ADENOCARCINOMA (C20) Impression: Complicated recovery due to late abscess and contained anastomotic leak with some help of percutaneous drainage and antibiotics.  She needs fecal diversion in possible anastomotic revision or better fistula unroofing to help solve this problem.  Continue fiber bowel regimen. I strongly recommend she try Imodium again to control diarrhea.  Proctitis probably returned. Place on antibiotics.  She would benefit from post-adjuvant chemotherapy but obviously this must be delayed until the fistula is closed. Sounds like they're observing only at this point given the prolonged period between surgery and healing. Would wait about a month from fistula closure before starting chemotherapy.  Current Plans Follow up with us in the office in 2 weeks.  Call us sooner as needed.  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  It has failed percutaneous drainage and nonoperative management.  Control diarrhea. Try Imodium.  Antibiotics for proctitis. Cipro/Flagyl.  Set up for fecal diversion. Loop ileostomy. Get preoperative marking.  TEM evaluation of anastomosis. Trying primarily closure versus better unroofing to posterior cavity/fistula. See if I can switch the transgluteal perc drain to something transabdominal. While I'm a little wary of trying to go through the posterior rectum at this point from the peritoneal side, she would  better tolerate a transabdominal drain as opposed to these transgluteal drains which cause her a lot of discomfort and agony. Maybe I could tunnel and through the gluteus bringing out the groin.  I offered to admit her now since she seems quite miserable. She again refuses. I strongly recommend she let me know how things are going with the antibiotics and the antidiarrhea treatment regimen. If she is not better, I will more strongly insist. Ultimately it is her decision. I tried to stress to her that she deserves a better quality of life than she has right now. I want to help her. We'll try and see if we can improve things as an outpatient until we   can get the surgery done.  Current Plans Pt Education - CCS Good Bowel Health () Started Ciprofloxacin HCl 500MG, 1 (one) Tablet twice a day, #28, 14 days starting 10/10/2015, Ref. x1. Started MetroNIDAZOLE 500MG, 1 (one) Tablet bid, #28, 14 days starting 10/10/2015, Ref. x2. Pt Education - CCS TEM Education (): discussed with patient and provided information. The anatomy & physiology of the digestive tract was discussed. The pathophysiology of the rectal pathology was discussed. Natural history risks without surgery was discussed. I feel the risks of no intervention will lead to serious problems that outweigh the operative risks; therefore, I recommended surgery.  Laparoscopic & open abdominal techniques were discussed. I recommended we start with a partial proctectomy by transanal endoscopic microsurgery (TEM) for excisional biopsy to remove the pathology and hopefully cure and/or control the pathology. This technique can offer less operative risk and faster post-operative recovery. Possible need for immediate or later abdominal surgery for further treatment was discussed.  Risks such as bleeding, abscess, reoperation, ostomy, heart attack, death, and other risks were discussed. I noted a good likelihood this will help address the problem. Goals  of post-operative recovery were discussed as well. We will work to minimize complications. An educational handout was given as well. Questions were answered. The patient expresses understanding & wishes to proceed with surgery.  Pt Education - [facility] Ostomy HCI (): discussed with patient and provided information. Started HYDROmorphone HCl 4MG, 1/2 -2 Tablet every four hours, as needed, #40, 10/10/2015, No Refill. ENCOUNTER FOR PREOPERATIVE EXAMINATION FOR GENERAL SURGICAL PROCEDURE (Z01.818)  Current Plans You are being scheduled for surgery - Our schedulers will call you.  You should hear from our office's scheduling department within 5 working days about the location, date, and time of surgery. We try to make accommodations for patient's preferences in scheduling surgery, but sometimes the OR schedule or the surgeon's schedule prevents us from making those accommodations.  If you have not heard from our office ([phone]) in 5 working days, call the office and ask for your surgeon's nurse.  If you have other questions about your diagnosis, plan, or surgery, call the office and ask for your surgeon's nurse.  Written instructions provided Pt Education - [facility] [place] Bowel Prep 2015 Miralax/Antibiotics Started Neomycin Sulfate 500MG, 2 (two) Tablet SEE NOTE, #6, 10/10/2015, No Refill. Local Order: TAKE TWO TABLETS AT 2 PM, 3 PM, AND 10 PM THE DAY PRIOR TO SURGERY Pt Education - CCS Colectomy post-op instructions: discussed with patient and provided information. Pt Education - Pamphlet Given - Laparoscopic Colorectal Surgery: discussed with patient and provided information. Pt Education - [facility] Pain Control ()   C. , M.D., F.A.C.S. Gastrointestinal and Minimally Invasive Surgery [facility] [address], [address] [place], [place] [address] [phone] Main / Paging   

## 2015-11-08 ENCOUNTER — Ambulatory Visit: Admission: RE | Admit: 2015-11-08 | Payer: Self-pay | Source: Ambulatory Visit | Admitting: Radiation Oncology

## 2015-11-08 ENCOUNTER — Inpatient Hospital Stay
Admission: RE | Admit: 2015-11-08 | Payer: BLUE CROSS/BLUE SHIELD | Source: Ambulatory Visit | Admitting: Radiation Oncology

## 2015-11-08 ENCOUNTER — Ambulatory Visit
Admission: RE | Admit: 2015-11-08 | Payer: BLUE CROSS/BLUE SHIELD | Source: Ambulatory Visit | Admitting: Radiation Oncology

## 2015-11-10 ENCOUNTER — Other Ambulatory Visit (HOSPITAL_BASED_OUTPATIENT_CLINIC_OR_DEPARTMENT_OTHER): Payer: BLUE CROSS/BLUE SHIELD

## 2015-11-10 ENCOUNTER — Telehealth: Payer: Self-pay | Admitting: Hematology

## 2015-11-10 ENCOUNTER — Other Ambulatory Visit: Payer: BLUE CROSS/BLUE SHIELD

## 2015-11-10 ENCOUNTER — Ambulatory Visit (HOSPITAL_BASED_OUTPATIENT_CLINIC_OR_DEPARTMENT_OTHER): Payer: BLUE CROSS/BLUE SHIELD

## 2015-11-10 DIAGNOSIS — C20 Malignant neoplasm of rectum: Secondary | ICD-10-CM

## 2015-11-10 DIAGNOSIS — D638 Anemia in other chronic diseases classified elsewhere: Secondary | ICD-10-CM

## 2015-11-10 DIAGNOSIS — Z452 Encounter for adjustment and management of vascular access device: Secondary | ICD-10-CM

## 2015-11-10 DIAGNOSIS — D500 Iron deficiency anemia secondary to blood loss (chronic): Secondary | ICD-10-CM

## 2015-11-10 DIAGNOSIS — D509 Iron deficiency anemia, unspecified: Secondary | ICD-10-CM

## 2015-11-10 LAB — CBC WITH DIFFERENTIAL/PLATELET
BASO%: 0.5 % (ref 0.0–2.0)
BASOS ABS: 0 10*3/uL (ref 0.0–0.1)
EOS ABS: 0.1 10*3/uL (ref 0.0–0.5)
EOS%: 1.5 % (ref 0.0–7.0)
HCT: 37 % (ref 34.8–46.6)
HGB: 11.9 g/dL (ref 11.6–15.9)
LYMPH%: 7 % — AB (ref 14.0–49.7)
MCH: 27.5 pg (ref 25.1–34.0)
MCHC: 32.2 g/dL (ref 31.5–36.0)
MCV: 85.4 fL (ref 79.5–101.0)
MONO#: 0.7 10*3/uL (ref 0.1–0.9)
MONO%: 7.3 % (ref 0.0–14.0)
NEUT#: 7.7 10*3/uL — ABNORMAL HIGH (ref 1.5–6.5)
NEUT%: 83.7 % — AB (ref 38.4–76.8)
Platelets: 390 10*3/uL (ref 145–400)
RBC: 4.33 10*6/uL (ref 3.70–5.45)
RDW: 14.7 % — ABNORMAL HIGH (ref 11.2–14.5)
WBC: 9.2 10*3/uL (ref 3.9–10.3)
lymph#: 0.6 10*3/uL — ABNORMAL LOW (ref 0.9–3.3)

## 2015-11-10 LAB — COMPREHENSIVE METABOLIC PANEL
ALT: <9 U/L (ref 0–55)
ANION GAP: 11 meq/L (ref 3–11)
AST: 10 U/L (ref 5–34)
Albumin: 3 g/dL — ABNORMAL LOW (ref 3.5–5.0)
Alkaline Phosphatase: 69 U/L (ref 40–150)
BILIRUBIN TOTAL: 0.4 mg/dL (ref 0.20–1.20)
BUN: 7.4 mg/dL (ref 7.0–26.0)
CHLORIDE: 104 meq/L (ref 98–109)
CO2: 24 meq/L (ref 22–29)
Calcium: 10 mg/dL (ref 8.4–10.4)
Creatinine: 0.7 mg/dL (ref 0.6–1.1)
GLUCOSE: 127 mg/dL (ref 70–140)
Potassium: 3.8 mEq/L (ref 3.5–5.1)
SODIUM: 138 meq/L (ref 136–145)
TOTAL PROTEIN: 7.4 g/dL (ref 6.4–8.3)

## 2015-11-10 LAB — IRON AND TIBC
%SAT: 15 % — ABNORMAL LOW (ref 21–57)
IRON: 26 ug/dL — AB (ref 41–142)
TIBC: 169 ug/dL — ABNORMAL LOW (ref 236–444)
UIBC: 143 ug/dL (ref 120–384)

## 2015-11-10 MED ORDER — ALTEPLASE 2 MG IJ SOLR
2.0000 mg | Freq: Once | INTRAMUSCULAR | Status: AC | PRN
  Administered 2015-11-10: 2 mg
  Filled 2015-11-10: qty 2

## 2015-11-10 MED ORDER — HEPARIN SOD (PORK) LOCK FLUSH 100 UNIT/ML IV SOLN
500.0000 [IU] | Freq: Once | INTRAVENOUS | Status: AC | PRN
  Administered 2015-11-10: 500 [IU]
  Filled 2015-11-10: qty 5

## 2015-11-10 MED ORDER — SODIUM CHLORIDE 0.9 % IJ SOLN
10.0000 mL | INTRAMUSCULAR | Status: DC | PRN
  Administered 2015-11-10: 10 mL
  Filled 2015-11-10: qty 10

## 2015-11-10 NOTE — Patient Instructions (Signed)

## 2015-11-10 NOTE — Telephone Encounter (Signed)
pt req flush for 5/4- sch pt appt-gave pt copy of avs

## 2015-11-11 LAB — CEA: CEA1: 1.5 ng/mL (ref 0.0–4.7)

## 2015-11-11 LAB — CEA (PARALLEL TESTING): CEA: 1.4 ng/mL

## 2015-11-13 ENCOUNTER — Encounter (HOSPITAL_COMMUNITY)
Admission: RE | Admit: 2015-11-13 | Discharge: 2015-11-13 | Disposition: A | Discharge status: Home / Self Care | Payer: BLUE CROSS/BLUE SHIELD | Source: Ambulatory Visit | Attending: Surgery | Admitting: Surgery

## 2015-11-13 ENCOUNTER — Encounter (HOSPITAL_COMMUNITY): Payer: Self-pay

## 2015-11-13 DIAGNOSIS — K603 Anal fistula: Secondary | ICD-10-CM | POA: Diagnosis present

## 2015-11-13 DIAGNOSIS — Z01812 Encounter for preprocedural laboratory examination: Secondary | ICD-10-CM | POA: Diagnosis not present

## 2015-11-13 HISTORY — DX: Presence of spectacles and contact lenses: Z97.3

## 2015-11-13 HISTORY — DX: Anxiety disorder, unspecified: F41.9

## 2015-11-13 LAB — BASIC METABOLIC PANEL
ANION GAP: 12 (ref 5–15)
BUN: 7 mg/dL (ref 6–20)
CO2: 26 mmol/L (ref 22–32)
Calcium: 9.8 mg/dL (ref 8.9–10.3)
Chloride: 101 mmol/L (ref 101–111)
Creatinine, Ser: 0.74 mg/dL (ref 0.44–1.00)
GFR calc Af Amer: >60 mL/min (ref >60)
GFR calc non Af Amer: >60 mL/min (ref >60)
GLUCOSE: 146 mg/dL — AB (ref 65–99)
POTASSIUM: 4 mmol/L (ref 3.5–5.1)
Sodium: 139 mmol/L (ref 135–145)

## 2015-11-13 LAB — CBC
HCT: 39.3 % (ref 36.0–46.0)
Hemoglobin: 12.6 g/dL (ref 12.0–15.0)
MCH: 27.8 pg (ref 26.0–34.0)
MCHC: 32.1 g/dL (ref 30.0–36.0)
MCV: 86.8 fL (ref 78.0–100.0)
Platelets: 450 K/uL — ABNORMAL HIGH (ref 150–400)
RBC: 4.53 MIL/uL (ref 3.87–5.11)
RDW: 14.1 % (ref 11.5–15.5)
WBC: 7.7 K/uL (ref 4.0–10.5)

## 2015-11-13 NOTE — Consult Note (Addendum)
WOC ostomy consult note WOC requested for preoperative stoma site marking by Dr. Johney Maine. Patient was marked by my partner, M. Liane Comber late last year. She has since lost "a good deal of weight" (patient is not specific). There is an abdominal panus. Discussed surgical procedure and stoma creation with patient and family.  Explained role of the [place] nurse team.  Patient has reviewed [employer] booklet/DVD and been provided samples of pouching options prior to visit.  Answered patient and significant others' questions.   Examined patient sitting and standing in order to place the marking in the patient's visual field, away from any creases or abdominal contour issues and within the rectus muscle.  Attempted to mark below the patient's belt line, but this is not possible due to body habitus.  Skin cleansed with HCG wipe x2 and this is allowed to dry prior to marking.  Marked for ileostomy in the RLQ  9.5cm to the right of the umbilicus and 79DL above the umbilicus. A loop ileostomy is the anticipated procedure.   Covered mark with thin film transparent dressing to preserve mark until date of surgery, 11/17/15. Patient signed consent card for [employer], but I will not mail or register until we know what supplies are indicated.  [place] nursing team will follow, and will remain available to this patient, the nursing and medical teams.   Thanks, Maudie Flakes, MSN, RN, [place], Arther Abbott  Pager# [phone]

## 2015-11-13 NOTE — Patient Instructions (Signed)
Lindsay Little  11/13/2015   Your procedure is scheduled on: Friday Nov 17, 2015  Report to [facility]  Entrance take [place]  elevators to 3rd floor to  [place] at 5:30 AM.  Call this number if you have problems the morning of surgery [phone]   Remember: ONLY 1 PERSON MAY GO WITH YOU TO SHORT STAY TO GET  READY MORNING OF [place].  Do not eat food or drink liquids :After Midnight.     Take these medicines the morning of surgery with A SIP OF WATER: Nexium if needed; Ciprofloxacin (Cipro); Hydromorphone (Dilaudid) if needed; Metronidazole (Flagyl)                               You may not have any metal on your body including hair pins and              piercings  Do not wear jewelry, make-up, lotions, powders or perfumes, deodorant             Do not wear nail polish.  Do not shave  48 hours prior to surgery.               Do not bring valuables to the hospital. [place].  Contacts, dentures or bridgework may not be worn into surgery.  Leave suitcase in the car. After surgery it may be brought to your room.      Special Instructions: FOLLOW SURGEON'S INSTRUCTION IN REGARDS TO BOWEL PREPARATION PRIOR TO SURGICAL DATE               Please read over the following fact sheets you were given:BLOOD TRANSFUSION INFORMATION SHEET _____________________________________________________________________             [facility] - Preparing for Surgery Before surgery, you can play an important role.  Because skin is not sterile, your skin needs to be as free of germs as possible.  You can reduce the number of germs on your skin by washing with CHG (chlorahexidine gluconate) soap before surgery.  CHG is an antiseptic cleaner which kills germs and bonds with the skin to continue killing germs even after washing. Please DO NOT use if you have an allergy to CHG or antibacterial soaps.  If your skin  becomes reddened/irritated stop using the CHG and inform your nurse when you arrive at Short Stay. Do not shave (including legs and underarms) for at least 48 hours prior to the first CHG shower.  You may shave your face/neck. Please follow these instructions carefully:  1.  Shower with CHG Soap the night before surgery and the  morning of Surgery.  2.  If you choose to wash your hair, wash your hair first as usual with your  normal  shampoo.  3.  After you shampoo, rinse your hair and body thoroughly to remove the  shampoo.                           4.  Use CHG as you would any other liquid soap.  You can apply chg directly  to the skin and wash  Gently with a scrungie or clean washcloth.  5.  Apply the CHG Soap to your body ONLY FROM THE NECK DOWN.   Do not use on face/ open                           Wound or open sores. Avoid contact with eyes, ears mouth and genitals (private parts).                       Wash face,  Genitals (private parts) with your normal soap.             6.  Wash thoroughly, paying special attention to the area where your surgery  will be performed.  7.  Thoroughly rinse your body with warm water from the neck down.  8.  DO NOT shower/wash with your normal soap after using and rinsing off  the CHG Soap.                9.  Pat yourself dry with a clean towel.            10.  Wear clean pajamas.            11.  Place clean sheets on your bed the night of your first shower and do not  sleep with pets. Day of Surgery : Do not apply any lotions/deodorants the morning of surgery.  Please wear clean clothes to the hospital/surgery center.  FAILURE TO FOLLOW THESE INSTRUCTIONS MAY RESULT IN THE CANCELLATION OF YOUR SURGERY PATIENT SIGNATURE_________________________________  NURSE SIGNATURE__________________________________  ________________________________________________________________________  WHAT IS A BLOOD TRANSFUSION? Blood Transfusion  Information  A transfusion is the replacement of blood or some of its parts. Blood is made up of multiple cells which provide different functions.  Red blood cells carry oxygen and are used for blood loss replacement.  White blood cells fight against infection.  Platelets control bleeding.  Plasma helps clot blood.  Other blood products are available for specialized needs, such as hemophilia or other clotting disorders. BEFORE THE TRANSFUSION  Who gives blood for transfusions?   Healthy volunteers who are fully evaluated to make sure their blood is safe. This is blood bank blood. Transfusion therapy is the safest it has ever been in the practice of medicine. Before blood is taken from a donor, a complete history is taken to make sure that person has no history of diseases nor engages in risky social behavior (examples are intravenous drug use or sexual activity with multiple partners). The donor's travel history is screened to minimize risk of transmitting infections, such as malaria. The donated blood is tested for signs of infectious diseases, such as HIV and hepatitis. The blood is then tested to be sure it is compatible with you in order to minimize the chance of a transfusion reaction. If you or a relative donates blood, this is often done in anticipation of surgery and is not appropriate for emergency situations. It takes many days to process the donated blood. RISKS AND COMPLICATIONS Although transfusion therapy is very safe and saves many lives, the main dangers of transfusion include:   Getting an infectious disease.  Developing a transfusion reaction. This is an allergic reaction to something in the blood you were given. Every precaution is taken to prevent this. The decision to have a blood transfusion has been considered carefully by your caregiver before blood is given. Blood is not given unless the benefits outweigh  the risks. AFTER THE TRANSFUSION  Right after receiving a  blood transfusion, you will usually feel much better and more energetic. This is especially true if your red blood cells have gotten low (anemic). The transfusion raises the level of the red blood cells which carry oxygen, and this usually causes an energy increase.  The nurse administering the transfusion will monitor you carefully for complications. HOME CARE INSTRUCTIONS  No special instructions are needed after a transfusion. You may find your energy is better. Speak with your caregiver about any limitations on activity for underlying diseases you may have. SEEK MEDICAL CARE IF:   Your condition is not improving after your transfusion.  You develop redness or irritation at the intravenous (IV) site. SEEK IMMEDIATE MEDICAL CARE IF:  Any of the following symptoms occur over the next 12 hours:  Shaking chills.  You have a temperature by mouth above 102 F (38.9 C), not controlled by medicine.  Chest, back, or muscle pain.  People around you feel you are not acting correctly or are confused.  Shortness of breath or difficulty breathing.  Dizziness and fainting.  You get a rash or develop hives.  You have a decrease in urine output.  Your urine turns a dark color or changes to pink, red, or brown. Any of the following symptoms occur over the next 10 days:  You have a temperature by mouth above 102 F (38.9 C), not controlled by medicine.  Shortness of breath.  Weakness after normal activity.  The white part of the eye turns yellow (jaundice).  You have a decrease in the amount of urine or are urinating less often.  Your urine turns a dark color or changes to pink, red, or brown. Document Released: 06/28/2000 Document Revised: 09/23/2011 Document Reviewed: 02/15/2008 [facility] Patient Information 2014 [place], [place].  _______________________________________________________________________

## 2015-11-14 ENCOUNTER — Telehealth: Payer: Self-pay

## 2015-11-14 LAB — HEMOGLOBIN A1C
Hgb A1c MFr Bld: 5.7 % — ABNORMAL HIGH (ref 4.8–5.6)
MEAN PLASMA GLUCOSE: 117 mg/dL

## 2015-11-14 NOTE — Progress Notes (Signed)
Pt states took bystolic 5 mg prior to PAT appt on 11/13/2015. Pt states does not take regularly.

## 2015-11-14 NOTE — Telephone Encounter (Signed)
Pt had a flush appt on Thursday for port access for her surgery on Friday. She is cancelling that appt b/c she will be doing bowel prep and unable to come to [facility]. Discussed w/ pt that the surgeon and anesthesia may not use her port, they prefer peripheral IVs. appt cancelled.

## 2015-11-16 MED ORDER — GENTAMICIN SULFATE 40 MG/ML IJ SOLN
480.0000 mg | INTRAVENOUS | Status: AC
  Administered 2015-11-17: 480 mg via INTRAVENOUS
  Filled 2015-11-16: qty 12

## 2015-11-16 MED ORDER — CLINDAMYCIN PHOSPHATE 900 MG/50ML IV SOLN
900.0000 mg | INTRAVENOUS | Status: AC
  Administered 2015-11-17: 900 mg via INTRAVENOUS

## 2015-11-16 MED ORDER — GENTAMICIN SULFATE 40 MG/ML IJ SOLN
INTRAMUSCULAR | Status: DC
  Filled 2015-11-16 (×2): qty 6

## 2015-11-17 ENCOUNTER — Encounter (HOSPITAL_COMMUNITY): Payer: Self-pay | Admitting: *Deleted

## 2015-11-17 ENCOUNTER — Inpatient Hospital Stay (HOSPITAL_COMMUNITY)
Admission: RE | Admit: 2015-11-17 | Discharge: 2015-11-23 | DRG: 330 | Disposition: A | Discharge status: Home Health | Payer: BLUE CROSS/BLUE SHIELD | Source: Ambulatory Visit | Attending: Surgery | Admitting: Surgery

## 2015-11-17 ENCOUNTER — Inpatient Hospital Stay (HOSPITAL_COMMUNITY): Payer: BLUE CROSS/BLUE SHIELD | Admitting: Registered Nurse

## 2015-11-17 ENCOUNTER — Encounter (HOSPITAL_COMMUNITY)
Admission: RE | Disposition: A | Discharge status: Home Health | Payer: Self-pay | Source: Ambulatory Visit | Attending: Surgery

## 2015-11-17 DIAGNOSIS — K658 Other peritonitis: Secondary | ICD-10-CM

## 2015-11-17 DIAGNOSIS — Z8249 Family history of ischemic heart disease and other diseases of the circulatory system: Secondary | ICD-10-CM

## 2015-11-17 DIAGNOSIS — Z6841 Body Mass Index (BMI) 40.0 and over, adult: Secondary | ICD-10-CM

## 2015-11-17 DIAGNOSIS — M179 Osteoarthritis of knee, unspecified: Secondary | ICD-10-CM | POA: Diagnosis present

## 2015-11-17 DIAGNOSIS — K632 Fistula of intestine: Secondary | ICD-10-CM | POA: Diagnosis present

## 2015-11-17 DIAGNOSIS — Y832 Surgical operation with anastomosis, bypass or graft as the cause of abnormal reaction of the patient, or of later complication, without mention of misadventure at the time of the procedure: Secondary | ICD-10-CM | POA: Diagnosis present

## 2015-11-17 DIAGNOSIS — E7800 Pure hypercholesterolemia, unspecified: Secondary | ICD-10-CM | POA: Diagnosis present

## 2015-11-17 DIAGNOSIS — Z90710 Acquired absence of both cervix and uterus: Secondary | ICD-10-CM

## 2015-11-17 DIAGNOSIS — R152 Fecal urgency: Secondary | ICD-10-CM | POA: Diagnosis present

## 2015-11-17 DIAGNOSIS — E440 Moderate protein-calorie malnutrition: Secondary | ICD-10-CM | POA: Diagnosis present

## 2015-11-17 DIAGNOSIS — G4730 Sleep apnea, unspecified: Secondary | ICD-10-CM | POA: Diagnosis present

## 2015-11-17 DIAGNOSIS — Z800 Family history of malignant neoplasm of digestive organs: Secondary | ICD-10-CM | POA: Diagnosis not present

## 2015-11-17 DIAGNOSIS — Z932 Ileostomy status: Secondary | ICD-10-CM

## 2015-11-17 DIAGNOSIS — F329 Major depressive disorder, single episode, unspecified: Secondary | ICD-10-CM | POA: Diagnosis present

## 2015-11-17 DIAGNOSIS — K660 Peritoneal adhesions (postprocedural) (postinfection): Secondary | ICD-10-CM | POA: Diagnosis present

## 2015-11-17 DIAGNOSIS — Z87440 Personal history of urinary (tract) infections: Secondary | ICD-10-CM

## 2015-11-17 DIAGNOSIS — C20 Malignant neoplasm of rectum: Secondary | ICD-10-CM | POA: Diagnosis present

## 2015-11-17 DIAGNOSIS — F32A Depression, unspecified: Secondary | ICD-10-CM | POA: Diagnosis present

## 2015-11-17 DIAGNOSIS — E66813 Obesity, class 3: Secondary | ICD-10-CM

## 2015-11-17 DIAGNOSIS — Z833 Family history of diabetes mellitus: Secondary | ICD-10-CM | POA: Diagnosis not present

## 2015-11-17 DIAGNOSIS — K5090 Crohn's disease, unspecified, without complications: Secondary | ICD-10-CM | POA: Diagnosis present

## 2015-11-17 DIAGNOSIS — C19 Malignant neoplasm of rectosigmoid junction: Secondary | ICD-10-CM | POA: Diagnosis present

## 2015-11-17 DIAGNOSIS — K589 Irritable bowel syndrome without diarrhea: Secondary | ICD-10-CM | POA: Diagnosis present

## 2015-11-17 DIAGNOSIS — I10 Essential (primary) hypertension: Secondary | ICD-10-CM | POA: Diagnosis present

## 2015-11-17 DIAGNOSIS — T814XXA Infection following a procedure, initial encounter: Secondary | ICD-10-CM | POA: Diagnosis present

## 2015-11-17 DIAGNOSIS — T8132XA Disruption of internal operation (surgical) wound, not elsewhere classified, initial encounter: Secondary | ICD-10-CM | POA: Diagnosis present

## 2015-11-17 DIAGNOSIS — D649 Anemia, unspecified: Secondary | ICD-10-CM | POA: Diagnosis present

## 2015-11-17 DIAGNOSIS — K9189 Other postprocedural complications and disorders of digestive system: Secondary | ICD-10-CM

## 2015-11-17 DIAGNOSIS — E65 Localized adiposity: Secondary | ICD-10-CM | POA: Diagnosis present

## 2015-11-17 DIAGNOSIS — Z923 Personal history of irradiation: Secondary | ICD-10-CM | POA: Diagnosis not present

## 2015-11-17 DIAGNOSIS — F419 Anxiety disorder, unspecified: Secondary | ICD-10-CM | POA: Diagnosis present

## 2015-11-17 DIAGNOSIS — Z9221 Personal history of antineoplastic chemotherapy: Secondary | ICD-10-CM

## 2015-11-17 DIAGNOSIS — Z808 Family history of malignant neoplasm of other organs or systems: Secondary | ICD-10-CM

## 2015-11-17 DIAGNOSIS — Z823 Family history of stroke: Secondary | ICD-10-CM

## 2015-11-17 DIAGNOSIS — E876 Hypokalemia: Secondary | ICD-10-CM | POA: Diagnosis present

## 2015-11-17 DIAGNOSIS — K219 Gastro-esophageal reflux disease without esophagitis: Secondary | ICD-10-CM | POA: Diagnosis present

## 2015-11-17 DIAGNOSIS — K651 Peritoneal abscess: Principal | ICD-10-CM | POA: Diagnosis present

## 2015-11-17 DIAGNOSIS — I341 Nonrheumatic mitral (valve) prolapse: Secondary | ICD-10-CM | POA: Diagnosis present

## 2015-11-17 DIAGNOSIS — N739 Female pelvic inflammatory disease, unspecified: Secondary | ICD-10-CM | POA: Diagnosis present

## 2015-11-17 DIAGNOSIS — IMO0002 Reserved for concepts with insufficient information to code with codable children: Secondary | ICD-10-CM | POA: Diagnosis present

## 2015-11-17 HISTORY — PX: LAPAROSCOPIC LYSIS OF ADHESIONS: SHX5905

## 2015-11-17 HISTORY — DX: Other postprocedural complications and disorders of digestive system: K91.89

## 2015-11-17 HISTORY — PX: EXCISION OF SKIN TAG: SHX6270

## 2015-11-17 HISTORY — PX: ILEO LOOP COLOSTOMY CLOSURE: SHX5257

## 2015-11-17 HISTORY — PX: IMPACTION REMOVAL: SHX5858

## 2015-11-17 LAB — TYPE AND SCREEN
ABO/RH(D): O POS
ANTIBODY SCREEN: NEGATIVE

## 2015-11-17 SURGERY — CLOSURE, ILEOSTOMY, LAPAROSCOPIC, WITH LAPAROTOMY IF INDICATED
Anesthesia: General | Site: Rectum

## 2015-11-17 MED ORDER — LORAZEPAM 2 MG/ML IJ SOLN: 0.5000 mg | Freq: Three times a day (TID) | INTRAMUSCULAR | Status: DC | PRN

## 2015-11-17 MED ORDER — 0.9 % SODIUM CHLORIDE (POUR BTL) OPTIME
TOPICAL | Status: DC | PRN
  Administered 2015-11-17: 2000 mL

## 2015-11-17 MED ORDER — SUGAMMADEX SODIUM 200 MG/2ML IV SOLN
INTRAVENOUS | Status: AC
  Filled 2015-11-17: qty 2

## 2015-11-17 MED ORDER — DIPHENHYDRAMINE HCL 12.5 MG/5ML PO ELIX: 12.5000 mg | ORAL_SOLUTION | Freq: Four times a day (QID) | ORAL | Status: DC | PRN

## 2015-11-17 MED ORDER — CHLORHEXIDINE GLUCONATE 4 % EX LIQD: 60.0000 mL | Freq: Once | CUTANEOUS | Status: DC

## 2015-11-17 MED ORDER — VITAMIN C 500 MG PO TABS
500.0000 mg | ORAL_TABLET | Freq: Two times a day (BID) | ORAL | Status: DC
  Administered 2015-11-17 – 2015-11-23 (×9): 500 mg via ORAL
  Filled 2015-11-17 (×13): qty 1

## 2015-11-17 MED ORDER — PHENOL 1.4 % MT LIQD
2.0000 | OROMUCOSAL | Status: DC | PRN
  Filled 2015-11-17: qty 177

## 2015-11-17 MED ORDER — ESMOLOL HCL 100 MG/10ML IV SOLN
INTRAVENOUS | Status: AC
  Filled 2015-11-17: qty 10

## 2015-11-17 MED ORDER — HYDROMORPHONE HCL 4 MG PO TABS: 4.0000 mg | ORAL_TABLET | Freq: Four times a day (QID) | ORAL | Status: DC | PRN

## 2015-11-17 MED ORDER — ONDANSETRON HCL 4 MG/2ML IJ SOLN
INTRAMUSCULAR | Status: DC | PRN
  Administered 2015-11-17: 4 mg via INTRAVENOUS

## 2015-11-17 MED ORDER — HYDROCODONE-ACETAMINOPHEN 7.5-325 MG PO TABS: 1.0000 | ORAL_TABLET | Freq: Once | ORAL | Status: DC | PRN

## 2015-11-17 MED ORDER — DEXAMETHASONE SODIUM PHOSPHATE 10 MG/ML IJ SOLN
INTRAMUSCULAR | Status: AC
  Filled 2015-11-17: qty 1

## 2015-11-17 MED ORDER — METOPROLOL TARTRATE 5 MG/5ML IV SOLN: 5.0000 mg | Freq: Four times a day (QID) | INTRAVENOUS | Status: DC | PRN

## 2015-11-17 MED ORDER — ONDANSETRON HCL 4 MG/2ML IJ SOLN
INTRAMUSCULAR | Status: AC
  Filled 2015-11-17: qty 2

## 2015-11-17 MED ORDER — ROCURONIUM BROMIDE 100 MG/10ML IV SOLN
INTRAVENOUS | Status: DC | PRN
  Administered 2015-11-17 (×2): 30 mg via INTRAVENOUS
  Administered 2015-11-17: 20 mg via INTRAVENOUS

## 2015-11-17 MED ORDER — LABETALOL HCL 5 MG/ML IV SOLN
INTRAVENOUS | Status: DC | PRN
  Administered 2015-11-17 (×2): 5 mg via INTRAVENOUS

## 2015-11-17 MED ORDER — PROCHLORPERAZINE EDISYLATE 5 MG/ML IJ SOLN
10.0000 mg | Freq: Four times a day (QID) | INTRAMUSCULAR | Status: DC | PRN
  Administered 2015-11-18 – 2015-11-22 (×12): 10 mg via INTRAVENOUS
  Filled 2015-11-17 (×12): qty 2

## 2015-11-17 MED ORDER — BUPIVACAINE LIPOSOME 1.3 % IJ SUSP
INTRAMUSCULAR | Status: DC | PRN
  Administered 2015-11-17: 20 mL

## 2015-11-17 MED ORDER — DIPHENHYDRAMINE HCL 50 MG/ML IJ SOLN: 12.5000 mg | Freq: Four times a day (QID) | INTRAMUSCULAR | Status: DC | PRN

## 2015-11-17 MED ORDER — FENTANYL CITRATE (PF) 250 MCG/5ML IJ SOLN
INTRAMUSCULAR | Status: DC | PRN
  Administered 2015-11-17: 100 ug via INTRAVENOUS
  Administered 2015-11-17: 150 ug via INTRAVENOUS

## 2015-11-17 MED ORDER — LIP MEDEX EX OINT
1.0000 "application " | TOPICAL_OINTMENT | Freq: Two times a day (BID) | CUTANEOUS | Status: DC
  Administered 2015-11-17 – 2015-11-23 (×10): 1 via TOPICAL
  Filled 2015-11-17 (×2): qty 7

## 2015-11-17 MED ORDER — LACTATED RINGERS IV SOLN
INTRAVENOUS | Status: DC
  Administered 2015-11-17 – 2015-11-20 (×2): via INTRAVENOUS

## 2015-11-17 MED ORDER — SODIUM CHLORIDE 0.9 % IV SOLN
INTRAVENOUS | Status: DC | PRN
  Administered 2015-11-17: 11:00:00 via INTRAPERITONEAL

## 2015-11-17 MED ORDER — ALUM & MAG HYDROXIDE-SIMETH 200-200-20 MG/5ML PO SUSP
30.0000 mL | Freq: Four times a day (QID) | ORAL | Status: DC | PRN
  Administered 2015-11-18: 30 mL via ORAL
  Filled 2015-11-17: qty 30

## 2015-11-17 MED ORDER — SUCCINYLCHOLINE CHLORIDE 20 MG/ML IJ SOLN
INTRAMUSCULAR | Status: DC | PRN
  Administered 2015-11-17: 140 mg via INTRAVENOUS

## 2015-11-17 MED ORDER — KETOROLAC TROMETHAMINE 30 MG/ML IJ SOLN
INTRAMUSCULAR | Status: AC
  Filled 2015-11-17: qty 1

## 2015-11-17 MED ORDER — ROCURONIUM BROMIDE 50 MG/5ML IV SOLN
INTRAVENOUS | Status: AC
  Filled 2015-11-17: qty 1

## 2015-11-17 MED ORDER — HYDROMORPHONE HCL 2 MG/ML IJ SOLN
INTRAMUSCULAR | Status: AC
  Filled 2015-11-17: qty 1

## 2015-11-17 MED ORDER — SUGAMMADEX SODIUM 500 MG/5ML IV SOLN
INTRAVENOUS | Status: DC | PRN
  Administered 2015-11-17: 200 mg via INTRAVENOUS

## 2015-11-17 MED ORDER — HYDROMORPHONE HCL 1 MG/ML IJ SOLN
INTRAMUSCULAR | Status: AC
  Filled 2015-11-17: qty 1

## 2015-11-17 MED ORDER — LABETALOL HCL 5 MG/ML IV SOLN
INTRAVENOUS | Status: AC
  Filled 2015-11-17: qty 4

## 2015-11-17 MED ORDER — PROPOFOL 10 MG/ML IV BOLUS
INTRAVENOUS | Status: AC
  Filled 2015-11-17: qty 20

## 2015-11-17 MED ORDER — DEXAMETHASONE SODIUM PHOSPHATE 10 MG/ML IJ SOLN
INTRAMUSCULAR | Status: DC | PRN
  Administered 2015-11-17: 10 mg via INTRAVENOUS

## 2015-11-17 MED ORDER — HYDROMORPHONE HCL 1 MG/ML IJ SOLN
0.2500 mg | INTRAMUSCULAR | Status: DC | PRN
  Administered 2015-11-17 (×2): 0.5 mg via INTRAVENOUS

## 2015-11-17 MED ORDER — CLINDAMYCIN PHOSPHATE 900 MG/50ML IV SOLN
900.0000 mg | Freq: Three times a day (TID) | INTRAVENOUS | Status: AC
  Administered 2015-11-17: 900 mg via INTRAVENOUS
  Filled 2015-11-17: qty 50

## 2015-11-17 MED ORDER — SACCHAROMYCES BOULARDII 250 MG PO CAPS
250.0000 mg | ORAL_CAPSULE | Freq: Two times a day (BID) | ORAL | Status: DC
  Administered 2015-11-17 – 2015-11-23 (×9): 250 mg via ORAL
  Filled 2015-11-17 (×15): qty 1

## 2015-11-17 MED ORDER — LACTATED RINGERS IV SOLN
INTRAVENOUS | Status: DC | PRN
Start: 2015-11-17 — End: 2015-11-17
  Administered 2015-11-17: 1000 mL
  Administered 2015-11-17 (×2): via INTRAVENOUS

## 2015-11-17 MED ORDER — LOPERAMIDE HCL 2 MG PO CAPS: 2.0000 mg | ORAL_CAPSULE | Freq: Three times a day (TID) | ORAL | Status: DC | PRN

## 2015-11-17 MED ORDER — ALVIMOPAN 12 MG PO CAPS
12.0000 mg | ORAL_CAPSULE | Freq: Two times a day (BID) | ORAL | Status: DC
  Administered 2015-11-18 – 2015-11-19 (×3): 12 mg via ORAL
  Filled 2015-11-17 (×4): qty 1

## 2015-11-17 MED ORDER — GLYCOPYRROLATE 0.2 MG/ML IJ SOLN
INTRAMUSCULAR | Status: AC
  Filled 2015-11-17: qty 1

## 2015-11-17 MED ORDER — CLINDAMYCIN PHOSPHATE 900 MG/50ML IV SOLN
INTRAVENOUS | Status: AC
  Filled 2015-11-17: qty 50

## 2015-11-17 MED ORDER — LACTATED RINGERS IV BOLUS (SEPSIS): 1000.0000 mL | Freq: Three times a day (TID) | INTRAVENOUS | Status: AC | PRN

## 2015-11-17 MED ORDER — LACTATED RINGERS IV SOLN
INTRAVENOUS | Status: DC | PRN
  Administered 2015-11-17 (×2): via INTRAVENOUS

## 2015-11-17 MED ORDER — BUPIVACAINE-EPINEPHRINE 0.25% -1:200000 IJ SOLN
INTRAMUSCULAR | Status: DC | PRN
  Administered 2015-11-17: 50 mL

## 2015-11-17 MED ORDER — ALVIMOPAN 12 MG PO CAPS
12.0000 mg | ORAL_CAPSULE | Freq: Once | ORAL | Status: AC
  Administered 2015-11-17: 12 mg via ORAL
  Filled 2015-11-17: qty 1

## 2015-11-17 MED ORDER — HYDROMORPHONE HCL 1 MG/ML IJ SOLN
INTRAMUSCULAR | Status: DC | PRN
  Administered 2015-11-17 (×2): 1 mg via INTRAVENOUS

## 2015-11-17 MED ORDER — SODIUM CHLORIDE 0.9 % IV SOLN
Freq: Once | INTRAVENOUS | Status: AC
  Administered 2015-11-17: 21:00:00 via INTRAVENOUS

## 2015-11-17 MED ORDER — MENTHOL 3 MG MT LOZG: 1.0000 | LOZENGE | OROMUCOSAL | Status: DC | PRN

## 2015-11-17 MED ORDER — ESMOLOL HCL 100 MG/10ML IV SOLN
INTRAVENOUS | Status: DC | PRN
  Administered 2015-11-17: 20 mg via INTRAVENOUS
  Administered 2015-11-17: 10 mg via INTRAVENOUS

## 2015-11-17 MED ORDER — MAGIC MOUTHWASH
15.0000 mL | Freq: Four times a day (QID) | ORAL | Status: DC | PRN
  Filled 2015-11-17: qty 15

## 2015-11-17 MED ORDER — FENTANYL CITRATE (PF) 250 MCG/5ML IJ SOLN
INTRAMUSCULAR | Status: AC
  Filled 2015-11-17: qty 5

## 2015-11-17 MED ORDER — ENOXAPARIN SODIUM 40 MG/0.4ML ~~LOC~~ SOLN
40.0000 mg | Freq: Once | SUBCUTANEOUS | Status: AC
  Administered 2015-11-17: 40 mg via SUBCUTANEOUS
  Filled 2015-11-17: qty 0.4

## 2015-11-17 MED ORDER — BUPIVACAINE LIPOSOME 1.3 % IJ SUSP
20.0000 mL | INTRAMUSCULAR | Status: DC
  Filled 2015-11-17: qty 20

## 2015-11-17 MED ORDER — PHENYLEPHRINE HCL 10 MG/ML IJ SOLN
INTRAMUSCULAR | Status: DC | PRN
  Administered 2015-11-17: 80 ug via INTRAVENOUS

## 2015-11-17 MED ORDER — MIDAZOLAM HCL 2 MG/2ML IJ SOLN
INTRAMUSCULAR | Status: AC
  Filled 2015-11-17: qty 2

## 2015-11-17 MED ORDER — HYDRALAZINE HCL 20 MG/ML IJ SOLN: 5.0000 mg | Freq: Four times a day (QID) | INTRAMUSCULAR | Status: DC | PRN

## 2015-11-17 MED ORDER — KETOROLAC TROMETHAMINE 30 MG/ML IJ SOLN
INTRAMUSCULAR | Status: DC | PRN
  Administered 2015-11-17: 30 mg via INTRAVENOUS

## 2015-11-17 MED ORDER — BISMUTH SUBSALICYLATE 262 MG/15ML PO SUSP
30.0000 mL | Freq: Three times a day (TID) | ORAL | Status: DC | PRN
  Filled 2015-11-17: qty 118

## 2015-11-17 MED ORDER — SODIUM CHLORIDE 0.9 % IJ SOLN
INTRAMUSCULAR | Status: AC
  Filled 2015-11-17: qty 50

## 2015-11-17 MED ORDER — METHOCARBAMOL 1000 MG/10ML IJ SOLN
1000.0000 mg | Freq: Four times a day (QID) | INTRAVENOUS | Status: DC | PRN
  Administered 2015-11-18 (×2): 1000 mg via INTRAVENOUS
  Filled 2015-11-17 (×4): qty 10

## 2015-11-17 MED ORDER — ACETAMINOPHEN 500 MG PO TABS
1000.0000 mg | ORAL_TABLET | Freq: Three times a day (TID) | ORAL | Status: DC
  Administered 2015-11-17 – 2015-11-18 (×4): 1000 mg via ORAL
  Filled 2015-11-17 (×13): qty 2

## 2015-11-17 MED ORDER — BUPIVACAINE-EPINEPHRINE 0.25% -1:200000 IJ SOLN
INTRAMUSCULAR | Status: AC
  Filled 2015-11-17: qty 1

## 2015-11-17 MED ORDER — LIDOCAINE HCL (CARDIAC) 20 MG/ML IV SOLN
INTRAVENOUS | Status: AC
  Filled 2015-11-17: qty 5

## 2015-11-17 MED ORDER — ROCURONIUM BROMIDE 50 MG/5ML IV SOLN
INTRAVENOUS | Status: AC
  Filled 2015-11-17: qty 2

## 2015-11-17 MED ORDER — PROPOFOL 10 MG/ML IV BOLUS
INTRAVENOUS | Status: DC | PRN
  Administered 2015-11-17: 150 mg via INTRAVENOUS

## 2015-11-17 MED ORDER — ENOXAPARIN SODIUM 40 MG/0.4ML ~~LOC~~ SOLN
40.0000 mg | SUBCUTANEOUS | Status: DC
  Administered 2015-11-18 – 2015-11-20 (×3): 40 mg via SUBCUTANEOUS
  Filled 2015-11-17 (×6): qty 0.4

## 2015-11-17 MED ORDER — HYDROMORPHONE HCL 1 MG/ML IJ SOLN
0.5000 mg | INTRAMUSCULAR | Status: DC | PRN
  Administered 2015-11-17: 2 mg via INTRAVENOUS
  Administered 2015-11-17 (×4): 1 mg via INTRAVENOUS
  Administered 2015-11-18 – 2015-11-19 (×10): 2 mg via INTRAVENOUS
  Administered 2015-11-19: 1 mg via INTRAVENOUS
  Administered 2015-11-19: 2 mg via INTRAVENOUS
  Administered 2015-11-19: 1 mg via INTRAVENOUS
  Administered 2015-11-19 – 2015-11-20 (×5): 2 mg via INTRAVENOUS
  Administered 2015-11-20: 1 mg via INTRAVENOUS
  Administered 2015-11-20 – 2015-11-22 (×11): 2 mg via INTRAVENOUS
  Filled 2015-11-17: qty 1
  Filled 2015-11-17 (×5): qty 2
  Filled 2015-11-17: qty 1
  Filled 2015-11-17 (×3): qty 2
  Filled 2015-11-17: qty 1
  Filled 2015-11-17 (×11): qty 2
  Filled 2015-11-17: qty 1
  Filled 2015-11-17 (×6): qty 2
  Filled 2015-11-17: qty 1
  Filled 2015-11-17 (×2): qty 2
  Filled 2015-11-17: qty 1
  Filled 2015-11-17 (×2): qty 2

## 2015-11-17 MED ORDER — MIDAZOLAM HCL 5 MG/5ML IJ SOLN
INTRAMUSCULAR | Status: DC | PRN
  Administered 2015-11-17: 2 mg via INTRAVENOUS

## 2015-11-17 SURGICAL SUPPLY — 62 items
BRIEF STRETCH FOR OB PAD LRG (UNDERPADS AND DIAPERS) ×6 IMPLANT
CABLE HI FREQUENCY MONOPOLAR (ELECTROSURGICAL) ×6 IMPLANT
CATH ROBINSON RED A/P 20FR (CATHETERS) ×6 IMPLANT
COVER MAYO STAND STRL (DRAPES) ×6 IMPLANT
COVER SURGICAL LIGHT HANDLE (MISCELLANEOUS) IMPLANT
DRAPE LAPAROSCOPIC ABDOMINAL (DRAPES) ×6 IMPLANT
DRAPE LAPAROTOMY T 102X78X121 (DRAPES) IMPLANT
DRAPE LG THREE QUARTER DISP (DRAPES) ×6 IMPLANT
DRAPE SURG IRRIG POUCH 19X23 (DRAPES) ×6 IMPLANT
DRAPE WARM FLUID 44X44 (DRAPE) IMPLANT
DRSG TEGADERM 2-3/8X2-3/4 SM (GAUZE/BANDAGES/DRESSINGS) ×18 IMPLANT
DRSG TEGADERM 4X4.75 (GAUZE/BANDAGES/DRESSINGS) ×6 IMPLANT
ELECT CAUTERY BLADE 6.4 (BLADE) ×6 IMPLANT
ELECT PENCIL ROCKER SW 15FT (MISCELLANEOUS) ×6 IMPLANT
ELECT REM PT RETURN 9FT ADLT (ELECTROSURGICAL) ×6
ELECTRODE REM PT RTRN 9FT ADLT (ELECTROSURGICAL) ×5 IMPLANT
EVACUATOR SILICONE 100CC (DRAIN) ×6 IMPLANT
GAUZE SPONGE 2X2 8PLY STRL LF (GAUZE/BANDAGES/DRESSINGS) ×5 IMPLANT
GAUZE SPONGE 4X4 12PLY STRL (GAUZE/BANDAGES/DRESSINGS) IMPLANT
GAUZE SPONGE 4X4 16PLY XRAY LF (GAUZE/BANDAGES/DRESSINGS) ×6 IMPLANT
GLOVE ECLIPSE 8.0 STRL XLNG CF (GLOVE) ×18 IMPLANT
GLOVE INDICATOR 8.0 STRL GRN (GLOVE) ×18 IMPLANT
GOWN STRL REUS W/TWL XL LVL3 (GOWN DISPOSABLE) ×30 IMPLANT
KIT BASIN OR (CUSTOM PROCEDURE TRAY) IMPLANT
LEGGING LITHOTOMY PAIR STRL (DRAPES) ×6 IMPLANT
LUBRICANT JELLY K Y 4OZ (MISCELLANEOUS) IMPLANT
PACK COLON (CUSTOM PROCEDURE TRAY) ×6 IMPLANT
PACK LITHOTOMY IV (CUSTOM PROCEDURE TRAY) ×6 IMPLANT
PAD POSITIONING PINK XL (MISCELLANEOUS) ×6 IMPLANT
POUCH DRAINABLE 1PC 2 1/4 FLAT (OSTOMY) ×6 IMPLANT
RETRACTOR LONE STAR DISPOSABLE (INSTRUMENTS) IMPLANT
RETRACTOR STAY HOOK 5MM (MISCELLANEOUS) IMPLANT
SCISSORS LAP 5X35 DISP (ENDOMECHANICALS) ×12 IMPLANT
SET IRRIG TUBING LAPAROSCOPIC (IRRIGATION / IRRIGATOR) ×12 IMPLANT
SHEARS HARMONIC ACE PLUS 36CM (ENDOMECHANICALS) IMPLANT
SLEEVE XCEL OPT CAN 5 100 (ENDOMECHANICALS) ×18 IMPLANT
SPONGE GAUZE 2X2 STER 10/PKG (GAUZE/BANDAGES/DRESSINGS) ×1
STAPLER VISISTAT 35W (STAPLE) ×6 IMPLANT
STOPCOCK 4 WAY LG BORE MALE ST (IV SETS) IMPLANT
SUT PDS AB 2-0 CT2 27 (SUTURE) IMPLANT
SUT PDS AB 3-0 SH 27 (SUTURE) IMPLANT
SUT PROLENE 2 0 SH DA (SUTURE) ×6 IMPLANT
SUT SILK 2 0 (SUTURE) ×6
SUT SILK 2 0 SH CR/8 (SUTURE) ×6 IMPLANT
SUT SILK 2-0 18XBRD TIE 12 (SUTURE) ×5 IMPLANT
SUT SILK 3 0 SH 30 (SUTURE) ×6 IMPLANT
SUT SILK 3 0 SH CR/8 (SUTURE) ×6 IMPLANT
SUT VIC AB 2-0 SH 18 (SUTURE) ×18 IMPLANT
SUT VIC AB 2-0 UR6 27 (SUTURE) IMPLANT
SUT VIC AB 3-0 SH 18 (SUTURE) IMPLANT
SUT VIC AB 3-0 SH 27 (SUTURE)
SUT VIC AB 3-0 SH 27XBRD (SUTURE) IMPLANT
SUT VIC AB 4-0 SH 18 (SUTURE) IMPLANT
TOWEL OR 17X26 10 PK STRL BLUE (TOWEL DISPOSABLE) ×6 IMPLANT
TOWEL OR NON WOVEN STRL DISP B (DISPOSABLE) ×6 IMPLANT
TRAY FOLEY W/METER SILVER 14FR (SET/KITS/TRAYS/PACK) ×6 IMPLANT
TRAY FOLEY W/METER SILVER 16FR (SET/KITS/TRAYS/PACK) IMPLANT
TRAY LAPAROSCOPIC (CUSTOM PROCEDURE TRAY) IMPLANT
TROCAR XCEL NON-BLD 11X100MML (ENDOMECHANICALS) ×6 IMPLANT
TROCAR XCEL NON-BLD 5MMX100MML (ENDOMECHANICALS) ×6 IMPLANT
TUBING INSUF HEATED (TUBING) ×12 IMPLANT
YANKAUER SUCT BULB TIP 10FT TU (MISCELLANEOUS) IMPLANT

## 2015-11-17 NOTE — H&P (Signed)
Lindsay Little 10/31/2015 8:57 AM Location: [facility] Patient #: [id number] DOB: 1965/10/05 Married / Language: English / Race: White Female   History of Present Illness  The patient is a 50 year old female who presents with colorectal cancer. Note for "Colorectal cancer": The patient is a 51 year old female who presents with colorectal cancer. Note for "Colorectal cancer": The patient is a 50 year old female who presents with colorectal cancer. The patient returns status post robotically-assisted low anterior resection for clinical T3 N1 rectal cancer and proximal/mid rectum. 05/25/2015. Downstaged [id number] (0/12 LN)  Patient struggled with postoperative diarrhea. Was really bothersome. It eventually got under better control. Appetite down but improving. Then started to feel worse. CT scan showed evidence of abscess cavity near the anastomosis strongly suspicious for leak. Admitted. IV antibiotics. Percutaneous drain placed 06/24/2015. Suspicious for control of anastomotic leak. Anemic. Got another dose of IV iron.  She comes today with her husband. She is feeling better. She denies any pelvic pain now. Flushes down to once a day since more frequent flushes caused irritation. Annoyed by the exit site and the smell from the drain. Usually mildly feculent output is 15-25 mL a day. No more diarrhea. 1- 2 soft well-formed bowel movements a day. Appetite better. Getting close to back to regular activity. Saw medical oncology. Holding off on post adjuvant therapy given the long recovery period and chemotherapy window closing.  LEFT drain study showed fistula much more narrow and subtle. Because it had been a stable low output fistula with a much smaller cavity and no evidence of internal opening or stricture any more, I went and removed the drain last month. Been 9 weeks since the drain & 13 weeks since surgery with improvement. Unfortunately, she had recurrent fecal  urgency and some discomfort. Repeat CT scan showed recurrent fistula. Drain replaced 09/16/2015. Follow-up drain study 3 weeks later showed some decrease in size.  She is feeling better. Cipro and Flagyl seem to help controlling the discomfort and urgency. Odium is controlling some mild diarrhea issues. The drain is leaking at the stop cock and worse when they try and flush. Energy is still relatively low. Comes today with her husband and son. Oral feels like she is getting better. Trying to minimize use of the dilated but still needs it to sleep and function. Unfortunately she was fired. Financial situation much more tenuous. Stress levels high but she feels like her body is at least under better control now.             POST-OPERATIVE DIAGNOSIS:   Proximal Rectal Cancer  SURGERY: 05/25/2015  XI ROBOTIC ASSISTED RECTOSIGMOID LOWER ANTERIOR RESECTION Laparoscopic and robotic lysis of adhesions X 60 MINUTES RIGID PROCTOSCOPY RIGHT SALPINGO-OOPHORECTOMY INSERTION PORT-A-CATH in R IJ  SURGEON:   Surgeon(s): Michael Boston, MD Leighton Ruff, MD  OR FINDINGS:  Patient had a bulky tumor in proximal and mid rectum. Most of it focused on the right anterolateral circumference. Still came down to around 9 cm from the anal verge.  I enlarged right ovary was some firm and mainly cystic areas. At least 6 cm in largest dimension. Left side looked atrophic and normal. Right ovary removed.  PowerPort is a ClearVUE 8 [place]. Reference #[id number] Lot number [id number] It is in the right internal jugular vein. The tip sits at the right atrial/superior vena caval junction  No obvious metastatic disease on visceral nor parietal peritoneum or liver.  The anastomosis rests 5-6 cm from the anal verge  by rigid proctoscopy.    Diagnosis 1. Colon, segmental resection for tumor, recto-sigmoid INVASIVE COLONIC ADENOCARCINOMA (0.2 CM IN GREATEST DIMENSION RESIDUAL TUMOR) THE  TUMOR INVADE SUBMUCOSA (0.3 CM IN DEPTH, PT1) MARGINS OF RESECTION ARE NEGATIVE FOR TUMOR TWELVE BENIGN LYMPH NODES (0/12) 2. Colon, resection margin (donut), proximal anastomotic ring BENIGN COLONIC TISSUE, NEGATIVE FOR MALIGANCY 3. Colon, resection margin (donut), distal anastomotic ring BENIGN COLONIC TISSUE, NEGATIVE FOR MALIGNANCY 4. Ovary, right BENIGN SEROUS CYSTADENOMA Microscopic Comment 1. COLON AND RECTUM (INCLUDING TRANS-ANAL RESECTION): Specimen: Colon-rectum Procedure: Segmental resection Tumor site: Rectum anterior wall mid to distal third of rectum Specimen integrity: intact Macroscopic intactness of mesorectum: Complete: _ Macroscopic tumor perforation: Negative Invasive tumor: Maximum size: Histologic type(s): G2 Histologic grade and differentiation: G1: well differentiated/low grade G2: moderately differentiated/low grade G3: poorly differentiated/high grade G4: undifferentiated/high grade Type of polyp in which invasive carcinoma arose: _ 1 of 3 Duplicate copy FINAL for Lindsay, CLANTON Little [phone]) Microscopic Comment(continued) Microscopic extension of invasive tumor:Submucosa Lymph-Vascular invasion: Not identified Peri-neural invasion: negative Tumor deposit(s) (discontinuous extramural extension): Negative Resection margins: Proximal margin: Negative Distal margin: Negative Circumferential (radial) (posterior ascending, posterior descending; lateral and posterior mid-rectum; and entire lower 1/3 rectum):Negatvie Mesenteric margin (sigmoid and transverse): Negative Distance closest margin (if all above margins negative): 2.5 cm Treatment effect (neo-adjuvant therapy): Present Additional polyp(s): Negative Non-neoplastic findings: Unremarkable Lymph nodes: number examined 12; number positive: 0 Pathologic Staging: T1, N0, M_ Ancillary studies: Preserved expression of the makor and minor MMR proteins MLH1, MSH2, MSH6 and PMS2. MLH1: Lindsay Lanius  MD Pathologist, Electronic Signature (Case signed 05/31/2015) Specimen Lindsay Little and Clinical Information Specimen(s) Obtained: 1. Colon, segmental resection for tumor, recto-sigmoid 2. Colon, resection margin (donut), proximal anastomotic ring 3. Colon, resection margin (donut), distal anastomotic ring 4. Ovary, right Specimen Clinical Information 1. proximal rectal cancer [rd] Mykala Mccready 1. Specimen: Rectosigmoid, to include at least portion of distal 1/3 of rectum. Specimen integrity: Intact Specimen length: 38 cm. 11 cm from sigmoid rectal junction to distal margin, and 4 cm from peritoneal reflection to distal margin. Mesorectal intactness: Complete Tumor location: Anterior wall, middle third to distal third of rectum Tumor size: There is a 4 cm in length and 2.2 cm in diameter area of healing ulceration with underlying indurated tan white dense tissue up to 1.1 cm thick. A discrete mass is not identified. Percent of bowel circumference involved: 40% Tumor distance to margins: Proximal: 28 cm Distal: 6 cm Radial (posterior ascending, posterior descending; lateral and posterior mid-rectum; and entire lower 1/3 rectum): The area of dense indurated tissue is 2.5 cm from the perirectal soft tissue margin. 2 of 3 Duplicate copy FINAL for LONETTA, BLASSINGAME [phone]) Kylee Umana(continued) Macroscopic extent of tumor invasion: No definite mass to evaluate invasion Total presumed lymph nodes: Found are seventeen possible lymph nodes ranging from 0.4 to 1.3 cm Extramural satellite tumor nodules: None Mucosal polyp(s): None Additional findings: None Block summary: A = proximal margin B = distal margin C-F = sections of ulceration and underlying indurated tissue G-J = ulceration and underlying indurated tissue, with nearest serosa/peritoneal reflection. K = perirectal soft tissue margin nearest indurated tissue L = four possible nodes, whole M = four possible nodes, whole Little = four possible  nodes, whole O = four possible node, whole P = one node bisected Total = sixteen blocks. 2. Received in formalin is a 2 cm in diameter and 1 cm thick ring of tan to hyperemic smooth mucosa with underlying tissue with embedded suture material.  Representative sections are submitted in one block. 3. Received in formalin is a 1.4 cm in diameter and up to 1 cm thick ring of tan to hyperemic smooth mucosa and underlying tissue with embedded metallic staples. Representative sections submitted in one block. 4. Received in formalin is a 5.1 x 4.4 x 3.4 cm ovary, clinically right, which has a tan pink to dark red exterior with scattered adhesions. There are also portions of attached tissue consistent with fimbria, however definite tube is not identified. On sectioning, there are several serous and mucoid filled cysts which are up to 4 cm in greatest dimension, and have smooth inner surfaces with no areas of excrescences. Tissue adjacent to the largest cyst is focally tan white, firm and dense. Representative sections are submitted in five blocks. (SSW:kh 05-26-15) Report signed out from the following location(s) Technical component and interpretation was performed at [place], [place], [place] [address]. CLIA #: [id number], 3 of 3   Problem List/Past Medical [name], MD; 10/31/2015 9:19 AM) RECTAL ADENOCARCINOMA (C20) INCISIONAL HERNIA, INCARCERATED (K43.0) URINARY FREQUENCY (R35.0) LARGE BOWEL ANASTOMOTIC LEAK (K91.89) ENCOUNTER FOR PREOPERATIVE EXAMINATION FOR GENERAL SURGICAL PROCEDURE (Z60.109)  Other Problems [name], MD; 10/31/2015 9:19 AM) Back Pain Diverticulosis Gastroesophageal Reflux Disease General anesthesia - complications High blood pressure Hypercholesterolemia Other disease, cancer, significant illness Rectal Cancer Transfusion history  Past Surgical History [name], MD; 10/31/2015 9:19 AM) Colon Polyp  Removal - Colonoscopy Hysterectomy (not due to cancer) - Partial Knee Surgery Left. Oral Surgery Shoulder Surgery Left.  Diagnostic Studies History [name], MD; 10/31/2015 9:19 AM) Colonoscopy >10 years ago Mammogram 1-3 years ago Pap Smear 1-5 years ago  Allergies [name], MD; 10/31/2015 9:19 AM) Lidocaine *CHEMICALS* All of the Caine's except MARCAINE is ok per the pt Penicillin G Benzathine & Proc *PENICILLINS* Sulfabenzamide *CHEMICALS*  Medication History Lars Mage Spillers, CMA; 10/31/2015 8:58 AM) Medications Reconciled  Pregnancy / Birth History [name], MD; 10/31/2015 9:19 AM) Age at menarche 69 years. Age of menopause 22-50 Gravida 4 Irregular periods Maternal age 25-20 Para 0  Vitals Lars Mage Spillers CMA; 10/31/2015 8:58 AM) 10/31/2015 8:58 AM Weight: 283 lb Height: 70.5in Body Surface Area: 2.43 m Body Mass Index: 40.03 kg/m  Pulse: 140 (Regular)  BP: 132/82 (Sitting, Left Arm, Standard)       Physical Exam [name] MD; 10/31/2015 9:21 AM) General Mental Status-Alert. General Appearance-Not in acute distress. Voice-Normal. Note: Relaxed. Nontoxic. Mildly anxious but consolable. Much less anxious and stressed today. Tearful at one point but consolable.   Integumentary Global Assessment Normal Exam - Distribution of scalp and body hair is normal. General Characteristics Overall Skin Surface - no rashes and no suspicious lesions.  Head and Neck Head-normocephalic, atraumatic with no lesions or palpable masses. Face Global Assessment - atraumatic, no absence of expression. Neck Global Assessment - no abnormal movements, no decreased range of motion. Trachea-midline. Thyroid Gland Characteristics - non-tender.  Eye Eyeball - Left-Extraocular movements intact, No Nystagmus. Eyeball - Right-Extraocular movements intact, No Nystagmus. Upper Eyelid - Left-No Cyanotic. Upper  Eyelid - Right-No Cyanotic.  Chest and Lung Exam Inspection Accessory muscles - No use of accessory muscles in breathing.  Abdomen Note: Incisions with normal healing ridges. No guarding/rebound tenderness. Really no pain. No hernias. No drainage.   Female Genitourinary Note: No vaginal bleeding or discharge. Posterior rectovaginal septum intact without any fullness or induration. No obvious fistula. No mass. No ulceration.   Rectal  Note: Drain with thinly feculent material. Again output 15-72m a day. Leaking at the stop-cock, especially with flushes. Not severe. Sensitive at exit site but no evidence of cellulitis.   Peripheral Vascular Upper Extremity Inspection - Left - Not Gangrenous, No Petechiae. Right - Not Gangrenous, No Petechiae.  Neurologic Neurologic evaluation reveals -normal attention span and ability to concentrate, able to name objects and repeat phrases. Appropriate fund of knowledge and normal coordination.  Neuropsychiatric Mental status exam performed with findings of-able to articulate well with normal speech/language, rate, volume and coherence and no evidence of hallucinations, delusions, obsessions or homicidal/suicidal ideation. Orientation-oriented X3. Note: Less anxious / tearful. Depressed & restless   Musculoskeletal Global Assessment Gait and Station - normal gait and station.  Lymphatic General Lymphatics Description - No Generalized lymphadenopathy.    Assessment & Plan  RECTAL ADENOCARCINOMA (C20) Impression: Complicated recovery due to late abscess and contained anastomotic leak with some help of percutaneous drainage and antibiotics.  She needs fecal diversion in possible anastomotic revision or better fistula unroofing to help solve this problem.  Continue fiber bowel regimen. I strongly recommend she try Imodium again to control diarrhea.  Proctitis probably returned. Place on antibiotics.  She would  benefit from post-adjuvant chemotherapy but obviously this must be delayed until the fistula is closed. Sounds like they're observing only at this point given the prolonged period between surgery and healing. Would wait about a month from fistula closure before starting chemotherapy. 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 Not close despite percutaneous drainage and oral antibiotics and improved nutrition.  It has failed percutaneous drainage and nonoperative management.  Controlling diarrhea better with Imodium.  Antibiotics for proctitis. Cipro/Flagyl. Continue until surgery  Set up for fecal diversion. Loop ileostomy. Get preoperative marking. She has ostomy and other instruction materials.  TEM evaluation of anastomosis. Trying primarily closure versus better unroofing to posterior cavity/fistula. See if I can switch the transgluteal perc drain to something transabdominal. While I'm a little wary of trying to go through the posterior rectum at this point from the peritoneal side, she would better tolerate a transabdominal drain as opposed to these transgluteal drains which cause her a lot of discomfort and agony. Maybe I could tunnel and through the gluteus bringing out the groin.  I'm glad that she is doing better and able to maintain being on the outside. I'm sorry that she was not able to keep her job in the fired her while she is trying to recover from cancer surgery. Hopefully they can get in a better place. I tried to stress to her that she deserves a better quality of life than she has right now. I want to help her. Current Plans Pt Education - [facility] Good Bowel Health (Rayen Palen) Continued Ciprofloxacin HCl 500MG, 1 (one) Tablet twice a day, #28, 14 days starting 10/31/2015, Ref.  x1. Continued MetroNIDAZOLE 500MG, 1 (one) Tablet bid, #28, 14 days starting 10/31/2015, Ref. x2. Pt Education - [facility] TEM Education (Rodric Punch): discussed with patient and provided information. The anatomy & physiology of the digestive tract was discussed. The pathophysiology of the rectal pathology was discussed. Natural history risks without surgery was discussed. I feel the risks of no intervention will lead to serious problems that outweigh the operative risks; therefore, I recommended surgery.  Laparoscopic & open abdominal techniques were discussed. I recommended  we start with a partial proctectomy by transanal endoscopic microsurgery (TEM) for excisional biopsy to remove the pathology and hopefully cure and/or control the pathology. This technique can offer less operative risk and faster post-operative recovery. Possible need for immediate or later abdominal surgery for further treatment was discussed.  Risks such as bleeding, abscess, reoperation, ostomy, heart attack, death, and other risks were discussed. I noted a good likelihood this will help address the problem. Goals of post-operative recovery were discussed as well. We will work to minimize complications. An educational handout was given as well. Questions were answered. The patient expresses understanding & wishes to proceed with surgery.  Pt Education - [place] (Payzlee Ryder): discussed with patient and provided information. Continued HYDROmorphone HCl 4MG, 1/2 -2 Tablet every four hours, as needed, #50, 10/31/2015, No Refill.  I have re-reviewed the the patient's records, history, medications, and allergies.  I have re-examined the patient.  I again discussed intraoperative plans and goals of post-operative recovery.  The patient agrees to proceed.   [name], M.D., F.A.C.S. Gastrointestinal and Minimally Invasive Surgery [facility] [address] [place], [place] [address] [phone]  Main / Paging

## 2015-11-17 NOTE — Op Note (Signed)
11/17/2015  10:54 AM  PATIENT:  Lindsay Little  50 y.o. 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  Irritated acrochordon right lower quadrant panniculus   PROCEDURE:    LAPAROSCOPIC lysis of adhesions 60 minutes DIVERTING LOOP ILEOSTOMY   DRAINAGE OF PELVIC ABSCESS OMENTAL PEDICLE FLAP TO DEEP PELVIS EXAM UNDER ANESTHESIA WITH DISIMPACTION  EXCISION OF SKIN TAG  Surgeon(s): Michael Boston, MD  ASSISTANT: RN   ANESTHESIA:   local and general  EBL:  Total I/O In: 2000 [I.V.:2000] Out: 250 [Urine:150; Blood:100]  Delay start of Pharmacological VTE agent (>24hrs) due to surgical blood loss or risk of bleeding:  no  DRAINS: (19 Fr) Blake drain(s) in the PELVIS   SPECIMEN:  Source of Specimen:  1.  pelvic abscess wall.   2. Ileal serosal wall   3. Acrochordon RLQ abd wall skin  DISPOSITION OF SPECIMEN:  PATHOLOGY  COUNTS:  YES  PLAN OF CARE: Admit to inpatient   PATIENT DISPOSITION:  PACU - hemodynamically stable.  INDICATION: Morbidly obese female with bulky rectal cancer status post neoadjuvant chemoradiation therapy on anterior resection in the fall.  Down staged.  Developed postoperative abscess and chronic leak.  Improve with percutaneous drainage but then returned.  Struggling.  I recommended diagnostic laparoscopy with diverting loop ileostomy.  Possible intra-abdominal drainage.  Examination under anesthesia.  The anatomy & physiology of the digestive tract was discussed.  The pathophysiology of the colon was discussed.  Natural history  risks without surgery was discussed.   I feel the risks of no intervention will lead to serious problems that outweigh the operative risks; therefore, I recommended a diverting loop ileostomy to help diver fecal stream away from the chronic anastomotic leak.  Possible repositioning or replacement of pelvic drain.   Minimally invasive (Robotic/Laparoscopic) & open techniques were discussed.   Risks such as bleeding, infection, abscess, leak, reoperation, injury to other organs, need for repair of tissues / organs, possible ostomy, hernia, heart attack, stroke, death, and other risks were discussed.  I noted a good likelihood this will help address the problem.   Goals of post-operative recovery were discussed as well.   Need for adequate nutrition, daily bowel regimen and healthy physical activity, to optimize recovery was noted as well. We will work to minimize complications.  Educational materials were available as well.  Questions were answered.  The patient expresses understanding & wishes to proceed with surgery.  OR FINDINGS: Anastomotic dehiscence 49% circumference right anterior to left posterior lateral.  Large body of stool and pelvis.  Pigtail drain easily palpated.  Some friability of abscess wall but no hard nodules suspicious for cancer recurrence.  Biopsy also nonetheless.  Been seen inflamed loop of distal ileum adherent and partially walling off the abscess cavity.  Part of serosa wall excised and sent to rule out cancer recurrence.  New pelvic drain comes in transabdominally and exits out the left upper quadrant.  Goes down the pelvis.  Large omental pedicle flap plugging down the right pelvis and trying to keep small bowel away.  Diverting loop ileostomy and high rub upper quadrant subcostal region.  Proximal loop at 12:00.  Distal loop inferior at 6:00.  No frank evidence of intraperitoneal carcinomatosis or cancer recurrence aside from fibrotic pelvic abscess wall.  Biopsies done as  noted above.  DESCRIPTION:   Informed consent was confirmed.   The patient received IV antibiotics and underwent general anesthesia without any difficulty. The patient was positioned in lithotomy.  . Foley catheter was sterilely placed. SCDs were active during the entire case.  The abdomen was prepped and draped in a sterile fashion.  A surgical timeout confirmed our plan.  I did examination under anesthesia.  Vaginal cuff intact with no obvious fistula itself.  Rectum full of large volumes of hard stool.  Just impacted a large volume out of the presacral space.  Can feel anastomotic dehiscence from around 10:00 to 4:00 in the right posterior lateral circumference.  I disimpacted large stool balls and Cleaned out large volume stool.  Washed out.  Could feel the pigtail of the percutaneous drain sitting in this.  I went and removed the trans-gluteal drain catheter in stitches and dressing.  Sphincter tone decreased but intact.  Change goes counts.  I entered the abdomen using optical entry technique in the left upper quadrant.  Entry was clean.  Extra ports carefully placed.  She had moderate adhesions of greater omentum to the anterior abdominal wall and pubic rim.  I carefully freed those off using a cautery scissors.  I mobilized the greater omentum off the left colon and reflected the upper abdomen.  Reflect the small bowel the upper abdomen the patient Trendelenburg.  The distal ileum was falling down into the very deep pelvis.  Because and I needed a loop ileostomy to go in the far right upper quadrant, tried to see if I can leave that in place first.  However I would have to go for more proximally for something to reach.  Therefore I did lyse adhesions to free the loop of small bowel off the presacral space and right lateral colonic mesentery.  Came around anteriorly and freed adhesions to the right pelvic brim as well.  Eventually reduced out.  Encountered the right posterior presacral abscess cavity.   I trimmed off some abscess wall and sent that for pathology to make sure there is no cancer recurrence.  Seen more inflammatory.  Washed out the pelvis with several liters of saline to clean up.    With that I could mobilize the small bowel better.  I mobilized the ileal mesentery off the retroperitoneum and the cecum and ascending colon off the retroperitoneum towards the mid kidney.  That allowed the bowel to reach very well into the right subcostal premarked region.  Ran the small bowel saw no abnormalities.  I saw no evidence of any metastatic disease.  Irrigation of a few more liters of saline.  Did a final irrigation of antibiotic solution clindamycin/gentamicin.  I freed the greater omentum off its attachments to the ascending colon, hepatic flexure, mid transverse colon.  This created an omental pedicle flap that can easily reached down into the pelvis.  Placed a drain down to the pelvis.  Placed a large swath of omentum down into the pelvis to help patch the area as well.  Aspirated the antibiotic solution.  Then placed a Red robinsonr through the mesentery of the distal ileum.  Held it with a laparoscopic clamp.  I evacuated carbon dioxide.  I made a 3 cm circular defect in the premarked stoma location region of the right subcostal skin.  I resected a cylinder  of subcutaneous fat to get down to the fascia 6 cm more deeply.  I split the anterior rectus fascia obliquely, parallel to the fibers.  Came through the muscle vertically.  Dilated to 3 fingers.  Brought a loop of distal ileum up with a red Robinson catheter intact.  Came out 7 cm with no tension.   Good orientation and there was no twisting nor torsion of the mesentery.  Proximal end was a 12 o'clock =  superior edge.  I did a final laparoscopic inspection.   Omental pedicle flap and drain laying in the pelvis.  Capnoperitoneum evacuated.  Ports removed.  Drain secured at skin with Prolene suture.  Potentially adherent to the skin mass  excised off the right panniculus.  Closed with Monocryl suture.  Port sites closed with Monocryl.  Sterile dressings applied to skin closure and drain.   I matured a loop ileostomy.  I transected 15 mm circle of ileal serosal inflammation that had been densely adherent to the abscess cavity.  I sent that off for pathology to make sure there is no evidence of any cancer or tumor recurrence.  Opened up the ileum vertically along the antimesenteric border as well.  I matured a loop ileostomy using 2-0 Vicryl interrupted suture.  It Brooked well.  Intubated both proximal and distal limbs with a finger through the fascia into the peritoneum easily.  The stoma was 3 cm high from the skin at the completion.  Placed an ostomy appliance.  I did examination under anesthesia.  I could feel the [facility] drain resting in the presacral space rolled over into the abscess cavity but not in the lumen itself.  Patient was extubated to the recovery room.  I discussed postop care and after findings in detail with the patient's sister and husband.  Questions answered.  They expressed understanding and appreciation.  and addressed.  I did copious irrigation       [name], M.D., F.A.C.S. Gastrointestinal and Minimally Invasive Surgery [facility] [address] [place], [place] [address] [phone] Main / Paging

## 2015-11-17 NOTE — Anesthesia Procedure Notes (Signed)
Procedure Name: Intubation Date/Time: 11/17/2015 7:40 AM Performed by: Chandra Batch A Pre-anesthesia Checklist: Patient identified, Timeout performed, Emergency Drugs available, Suction available and Patient being monitored Patient Re-evaluated:Patient Re-evaluated prior to inductionOxygen Delivery Method: Circle system utilized Preoxygenation: Pre-oxygenation with 100% oxygen Intubation Type: IV induction Ventilation: Mask ventilation without difficulty Laryngoscope Size: Mac and 3 Grade View: Grade I Tube type: Oral Tube size: 7.5 mm Number of attempts: 1 Airway Equipment and Method: Stylet Placement Confirmation: ETT inserted through vocal cords under direct vision,  breath sounds checked- equal and bilateral and positive ETCO2 Secured at: 22 cm Tube secured with: Tape Dental Injury: Teeth and Oropharynx as per pre-operative assessment

## 2015-11-17 NOTE — Transfer of Care (Signed)
Immediate Anesthesia Transfer of Care Note  Patient: Lindsay Little  Procedure(s) Performed: Procedure(s): LAPAROSCOPIC DIVERTING LOOP ILEOSTOMY  DRAINAGE OF PELVIC ABSCESS (N/A) DISIMPACTION REMOVAL EXAM UNDER ANESTHESIA EXCISION OF SKIN TAG LAPAROSCOPIC LYSIS OF ADHESIONS  Patient Location: PACU  Anesthesia Type:General  Level of Consciousness: awake, alert  and oriented  Airway & Oxygen Therapy: Patient Spontanous Breathing and Patient connected to face mask oxygen  Post-op Assessment: Report given to RN and Post -op Vital signs reviewed and stable  Post vital signs: Reviewed and stable  Last Vitals:  Filed Vitals:   11/17/15 0535 11/17/15 0606  BP: 151/104 148/100  Pulse: 129 118  Temp: 36.9 C   Resp: 18     Last Pain:  Filed Vitals:   11/17/15 0607  PainSc: 7       Patients Stated Pain Goal: 4 ([id number])  Complications: No apparent anesthesia complications

## 2015-11-17 NOTE — Discharge Instructions (Signed)
Ostomy Support Information ° °You’ve heard that people get along just fine with only one of their eyes, or one of their lungs, or one of their kidneys. But you also know that you have only one intestine and only one bladder, and that leaves you feeling awfully empty, both physically and emotionally: You think no other people go around without part of their intestine with the ends of their intestines sticking out through their abdominal walls.  ° °YOU ARE NOT ALONE.  There are nearly three quarters of a million people in the [place] who have an ostomy; people who have had surgery to remove all or part of their colons or bladders.  ° °There is even a national association, the [employer] with over 350 local affiliated support groups that are organized by volunteers who provide peer support and counseling. [employer] has a toll free telephone num-ber, 800-826-0826 and an educational, interactive website, www.ostomy.org  ° °An ostomy is an opening in the belly (abdominal wall) made by surgery. Ostomates are people who have had this procedure. The opening (stoma) allows the kidney or bowel to discharge waste. An external pouch covers the stoma to collect waste. Pouches are are a simple bag and are odor free. Different companies have disposable or reusable pouches to fit one's lifestyle. An ostomy can either be temporary or permanent.  ° °THERE ARE THREE MAIN TYPES OF OSTOMIES °· Colostomy. A colostomy is a surgically created opening in the large intestine (colon). °· Ileostomy. An ileostomy is a surgically created opening in the small intestine. °· Urostomy. A urostomy is a surgically created opening to divert urine away from the bladder. ° °OSTOMY Care ° °The following guidelines will make care of your colostomy easier. Keep this information close by for quick reference. ° °Helpful DIET hints °Eat a well-balanced diet including vegetables and fresh fruits. Eat on a regular schedule. Drink at least 6 to 8  glasses of fluids daily. °Eat slowly in a relaxed atmosphere. Chew your food thoroughly. Avoid chewing gum, smoking, and drinking from a straw. This will help decrease the amount of air you swallow, which may help reduce gas. °Eating yogurt or drinking buttermilk may help reduce gas. ° °To control gas at night, do not eat after 8 p.m. This will give your bowel time to quiet down before you go to bed. ° °If gas is a problem, you can purchase Beano. Sprinkle Beano on the first bite of food before eating to reduce gas. It has no flavor and should not change the taste of your food. You can buy Beano over the counter at your local drugstore. ° °Foods like fish, onions, garlic, broccoli, asparagus, and cabbage produce odor. Although your pouch is odor-proof, if you eat these foods you may notice a stronger odor when emptying your pouch. If this is a concern, you may want to limit these foods in your diet. ° °If you have an ileostomy, you will have chronic diarrhea & need to drink more liquids to avoid getting dehydrated.  Consider antidiarrheal medicine like imodium (loperamide) or Lomotil to help slow down bowel movements / diarrhea into your ileostomy bag. ° °GETTING TO GOOD BOWEL HEALTH WITH AN ILEOSTOMY °. °Irregular bowel habits such as constipation and diarrhea can lead to many problems over time.  The goal: 3-6 small BOWEL MOVEMENTS A DAY!  To have soft, regular bowel movements:  °• Drink plenty of fluids, consider 4-6 tall glasses of water a day.   ° °Controlling   diarrheA ° °o Switch to liquids and simpler foods for a few days to avoid stressing your intestines further. °o Avoid dairy products (especially milk & ice cream) for a short time.  The intestines often can lose the ability to digest lactose when stressed. °o Avoid foods that cause gassiness or bloating.  Typical foods include beans and other legumes, cabbage, broccoli, and dairy foods.  Every person has some sensitivity to other foods, so listen to our  body and avoid those foods that trigger problems for you. °o Adding fiber (Citrucel, Metamucil, psyllium, Miralax) gradually can help thicken stools by absorbing excess fluid and retrain the intestines to act more normally.  Slowly increase the dose over a few weeks.  Too much fiber too soon can backfire and cause cramping & bloating. °o Probiotics (such as active yogurt, Align, etc) may help repopulate the intestines and colon with normal bacteria and calm down a sensitive digestive tract.  Most studies show it to be of mild help, though, and such products can be costly. °o Medicines: °- Bismuth subsalicylate (ex. Kayopectate, Pepto Bismol) every 30 minutes for up to 6 doses can help control diarrhea.  Avoid if pregnant. °- Loperamide (Immodium) can slow down diarrhea.  Start with two tablets (4mg total) first and then try one tablet every 6 hours.  Avoid if you are having fevers or severe pain.  If you are not better or start feeling worse, stop all medicines and call your doctor for advice °o Call your doctor if you are getting worse or not better.  Sometimes further testing (cultures, endoscopy, X-ray studies, bloodwork, etc) may be needed to help diagnose and treat the cause of the diarrhea. ° °TROUBLESHOOTING IRREGULAR BOWELS °1) Avoid extremes of bowel movements (no bad constipation/diarrhea) °2) Miralax 17gm mixed in 8oz. water or juice-daily. May use twice a day as needed  °3) Gas-x,Phazyme, etc. as needed for gas & bloating.  °4) Soft,bland diet. No spicy,greasy,fried foods.  °5) Prilosec (omeprazole) over-the-counter as needed  °6) May hold gluten/wheat products from diet to see if symptoms improve.  °7)  May try probiotics (Align, Activa, etc) to help calm the bowels down °7) If symptoms become worse call back immediately. ° ° °Applying the pouching system °To apply your pouch, follow these steps: ° °Place all your equipment close at hand before removing your pouch. ° °Wash your hands. ° °Stand or sit in  front of a mirror. Use the position that works best for you. Remember that you must keep the skin around the stoma wrinkle-free for a good seal. ° °Gently remove the used pouch (1-piece system) or the pouch and old wafer (2-piece system). Empty the pouch into the toilet. Save the closure clip to use again. ° °Wash the stoma itself and the skin around the stoma. Your stoma may bleed a little when being washed. This is normal. Rinse and pat dry. You may use a wash cloth or soft paper towels (like Bounty), mild soap (like Dial, Safeguard, or Ivory), and water. Avoid soaps that contain perfumes or lotions. ° °For a new pouch (1-piece system) or a new wafer (2-piece system), measure your stoma using the stoma guide in each box of supplies. ° °Trace the shape of your stoma onto the back of the new pouch or the back of the new wafer. Cut out the opening. Remove the paper backing and set it aside. ° °Optional: Apply a skin barrier powder to surrounding skin if it is irritated (bare or weeping),   and dust off the excess. °Optional: Apply a skin-prep wipe (such as Skin Prep or All-Kare) to the skin around the stoma, and let it dry. Do not apply this solution if the skin is irritated (red, tender, or broken) or if you have shaved around the stoma. °Optional: Apply a skin barrier paste (such as Stomahesive, Coloplast, or Premium) around the opening cut in the back of the pouch or wafer. Allow it to dry for 30 to 60 seconds. ° °Hold the pouch (1-piece system) or wafer (2-piece system) with the sticky side toward your body. Make sure the skin around the stoma is wrinkle-free. Center the opening on the stoma, then press firmly to your abdomen (Fig. 4). Look in the mirror to check if you are placing the pouch, or wafer, in the right position. For a 2-piece system, snap the pouch onto the wafer. Make sure it snaps into place securely. ° °Place your hand over the stoma and the pouch or wafer for about 30 seconds. The heat from your  hand can help the pouch or wafer stick to your skin. ° °Add deodorant (such as Super Banish or Nullo) to your pouch. Other options include food extracts such as vanilla oil and peppermint extract. Add about 10 drops of the deodorant to the pouch. Then apply the closure clamp. Note: Do not use toxic ° chemicals or commercial cleaning agents in your pouch. These substances may harm the stoma. ° °Optional: For extra seal, apply tape to all 4 sides around the pouch or wafer, as if you were framing a picture. You may use any brand of medical adhesive tape. °Change your pouch every 5 to 7 days. Change it immediately if a leak occurs.  Wash your hands afterwards. ° °If you are wearing a 2-piece system, you may use 2 new pouches per week and alternate them. Rinse the pouch with mild soap and warm water and hang it to dry for the next day. Apply the fresh pouch. Alternate the 2 pouches like this for a week. After a week, change the wafer and begin with 2 new pouches. Place the old pouches in a plastic bag, and put them in the trash. ° ° ° °Tips for colostomy care ° °Applying Your Pouch °You may stand or sit to apply your pouch. ° °Keep the skin where you apply the pouch wrinkle-free. If the skin around the pouch is wrinkled, the seal may break when your skin stretches. ° °If hair grows close to your stoma, you may trim off the hair with scissors, an electric razor, or a safety razor. ° °Always have a mirror nearby so you can get a better view of your stoma. ° °When you apply a new pouch, write the date on the adhesive tape. This will remind you of when you last changed your pouch. ° °Changing Your Pouch °The best time to change your pouch is in the morning, before eating or drinking anything. Your stoma can function at any time, but it will function more after eating or drinking. ° °Emptying Your Pouch °Empty your pouch when it is one-third full (of urine, stool, and/or gas). If you wait until your pouch is fuller than this,  it will be more difficult to empty and more noticeable. °When you empty your pouch, either put toilet paper in the toilet bowl first, or flush the toilet while you empty the pouch. This will reduce splashing. You can empty the pouch between your legs or to one side while sitting,   or while standing or stooping. If you have a 2-piece system, you can snap off the pouch to empty it. Remember that your stoma may function during this time. °If you wish to rinse your pouch after you empty it, a turkey baster can be helpful. When using a baster, squirt water up into the pouch through the opening at the °bottom. With a 2-piece system, you can snap off the pouch to rinse it. After rinsing  your pouch, empty it into the toilet. °When rinsing your pouch at home, put a few granules of Dreft soap in the rinse water. This helps lubricate and freshen your pouch. °The inside of your pouch can be sprayed with non-stick cooking oil (Pam spray). This may help reduce stool sticking to the inside of the pouch. ° °Bathing °You may shower or bathe with your pouch on or off. Remember that your stoma may function during this time. ° °The materials you use to wash your stoma and the skin around it should be clean, but they do not need to be sterile. ° °Wearing Your Pouch °During hot weather, or if you perspire a lot in general, wear a cover over your pouch. This may prevent a rash on your skin under the pouch. Pouch covers are sold at ostomy supply stores. °Wear the pouch inside your underwear for better support. °Watch your weight. Any gain or loss of 10 to 15 pounds or more can change the way your pouch fits. ° °Going Away From Home °A collapsible cup (like those that come in travel kits) or a soft plastic squirt bottle with a pull-up top (like a travel bottle for shampoo) can be used for rinsing your pouch when you are away from home. Tilt the opening of the pouch at an upward angle when using a cup to rinse. ° °Carry wet wipes or extra  tissues to use in public bathrooms. ° °Carry an extra pouching system with you at all times. ° °Never keep ostomy supplies in the glove compartment of your car. Extreme heat or cold can damage the skin barriers and adhesive wafers on the pouch. ° °When you travel, carry your ostomy supplies with you at all times. Keep them within easy reach. Do not pack ostomy supplies in baggage that will be checked or otherwise separated from you, because your baggage might be lost. If you’re traveling out of the country, it is helpful to have a letter stating that you are carrying ostomy supplies as a medical necessity. ° °If you need ostomy supplies while traveling, look in the yellow pages of the telephone book under “Surgical Supplies.” Or call the local ostomy organization to find out where supplies are available. ° °Do not let your ostomy supplies get low. Always order new pouches before you use the last one. ° °Reducing Odor °Limit foods such as broccoli, cabbage, onions, fish, and garlic in your diet to help reduce odor. °Each time you empty your pouch, carefully clean the opening of the pouch, both inside and outside, with toilet paper. °Rinse your pouch 1 or 2 times daily after you empty it (see directions for emptying your pouch and going away from home). °Add deodorant (such as Super Banish or Nullo) to your pouch. °Use air deodorizers in your bathroom. °Do not add aspirin to your pouch. Even though aspirin can help prevent odor, it could cause ulcers on your stoma. ° °When to call the doctor °Call the doctor if you have any of the following symptoms: °Purple, black,   or white stoma Severe cramps lasting more than 6 hours Severe watery discharge from the stoma lasting more than 6 hours No output from the colostomy for 3 days Excessive bleeding from your stoma Swelling of your stoma to more than 1/2-inch larger than usual Pulling inward of your stoma below skin level Severe skin irritation or deep ulcers Bulging  or other changes in your abdomen  When to call your ostomy nurse Call your ostomy/enterostomal therapy (ET) nurse if any of the following occurs: Frequent leaking of your pouching system Change in size or appearance of your stoma, causing discomfort or problems with your pouch Skin rash or rawness Weight gain or loss that causes problems with your pouch      FREQUENTLY ASKED QUESTIONS   Why havent you met any of these folks who have an ostomy?  Well, maybe you have! You just did not recognize them because an ostomy doesn't show. It can be kept secret if you wish. Why, maybe some of your best friends, office associates or neighbors have an ostomy ... you never can tell.   People facing ostomy surgery have many quality-of-life questions like:  Will you bulge? Smell? Make noises? Will you feel waste leaving your body? Will you be a captive of the toilet? Will you starve? Be a social outcast? Get/stay married? Have babies? Easily bathe, go swimming, bend over?  OK, lets look at what you can expect:   Will you bulge?  Remember, without part of the intestine or bladder, and its contents, you should have a flatter tummy than before. You can expect to wear, with little exception, what you wore before surgery ... and this in-cludes tight clothing and bathing suits.   Will you smell?  Today, thanks to modern odor proof pouching systems, you can walk into an ostomy support group meeting and not smell anything that is foul or offensive. And, for those with an ileostomy or colostomy who are concerned about odor when emptying their pouch, there are in-pouch deodorants that can be used to eliminate any waste odors that may exist.   Will you make noises?  Everyone produces gas, especially if they are an air-swallower. But intestinal sounds that occur from time to time are no differ-ent than a gurgling tummy, and quite often your clothing will muffle any sounds.   Will you feel the waste discharges?   For those with a colostomy or ileostomy there might be a slight pressure when waste leaves your body, but understand that the intestines have no nerve endings, so there will be no unpleasant sensations. Those with a urostomy will probably be unaware of any kidney drainage.   Will you be a captive of the toilet?  Immediately post-op you will spend more time in the bathroom than you will after your body recovers from surgery. Every person is different, but on average those with an ileostomy or urostomy may empty their pouches 4 to 6 times a day; a little  less if you have a colostomy. The average wear time between pouch system changes is 3 to 5 days and the changing process should take less than 30 minutes.   Will I need to be on a special diet? Most people return to their normal diet when they have recovered from surgery. Be sure to chew your food well, eat a well-balanced diet and drink plenty of fluids. If you experience problems with a certain food, wait a couple of weeks and try it again.  Will there be  odor and noises? °Pouching systems are designed to be odor-proof or odor-resistant. There are deodorants that can be used in the pouch. Medications are also available to help reduce odor. Limit gas-producing foods and carbonated beverages. You will experience less gas and fewer noises as you heal from surgery. ° °How much time will it take to care for my ostomy? °At first, you may spend a lot of time learning about your ostomy and how to take care of it. As you become more comfortable and skilled at changing the pouching system, it will take very little time to care for it.  ° °Will I be able to return to work? °People with ostomies can perform most jobs. As soon as you have healed from surgery, you should be able to return to work. Heavy lifting (more than 10 pounds) may be discouraged.  ° °What about intimacy? °Sexual relationships and intimacy are important and fulfilling aspects of your life. They  should continue after ostomy surgery. Intimacy-related concerns should be discussed openly between you and your partner.  ° °Can I wear regular clothing? °You do not need to wear special clothing. Ostomy pouches are fairly flat and barely noticeable. Elastic undergarments will not hurt the stoma or prevent the ostomy from functioning.  ° °Can I participate in sports? °An ostomy should not limit your involvement in sports. Many people with ostomies are runners, skiers, swimmers or participate in other active lifestyles. Talk with your caregiver first before doing heavy physical activity. ° °Will you starve?  °Not if you follow doctor’s orders at each stage of your post-op adjustment. There is no such thing as an “ostomy diet”. Some people with an ostomy will be able to eat and tolerate anything; others may find diffi-culty with some foods. Each person is an individual and must determine, by trial, what is best for them. A good practice for all is to drink plenty of water.  ° °Will you be a social outcast?  °Have you met anyone who has an ostomy and is a social outcast? Why should you be the first? Only your attitude and self image will effect how you are treated. No confi-dent person is an outcast.  ° ° ° °PROFESSIONAL HELP  °Resources are available if you need help or have questions about your ostomy.  ° °· Specially trained nurses called Wound, Ostomy Continence Nurses (WOCN) are available for consultation in most major medical centers. ° °· Consider getting an ostomy consult with Kathy Probst at [facility] to help troubleshoot stoma pouch fittings and other issues with your ostomy: [phone] ° °· The United Ostomy Association (UOA) is a group made up of many local chapters throughout the [place]. These local groups hold meetings and provide support to prospective and existing ostomates. They sponsor educational events and have qualified visitors to make personal or telephone visits. Contact  the UOA for the chapter nearest you and for other educational publications. ° °· More detailed information can be found in Colostomy Guide, a publication of the United Ostomy Association (UOA). Contact UOA at 1-800-826-0826 or visit their web site at www.uoaa.org. The website contains links to other sites, suppliers and resources. ° °· [facility] Secure Start Services: °· Start at the website to enlist for support.  Your Wound Ostomy (WOCN) nurse may have started this process. https://www.[facility].com/en/securestart °· Secure Start services are designed to support people as they live their lives with an ostomy or neurogenic bladder. Enrolling is easy and at no cost to the   patient. We realize that each person's needs and life journey are different. Through Secure Start services, we want to help people live their life, their way.  SURGERY: POST OP INSTRUCTIONS (Surgery for small bowel obstruction, colon resection, etc)    DIET Follow a light diet the first few days at home.  Start with a bland diet such as soups, liquids, starchy foods, low fat foods, etc.  If you feel full, bloated, or constipated, stay on a ful liquid or pureed/blenderized diet for a few days until you feel better and no longer constipated. Be sure to drink plenty of fluids every day to avoid getting dehydrated (feeling dizzy, not urinating, etc.). Gradually add a fiber supplement to your diet over the next week.  Gradually get back to a regular solid diet.  Avoid fast food or heavy meals the first week as you are more likely to get nauseated. It is expected for your digestive tract to need a few months to get back to normal.  It is common for your bowel movements and stools to be irregular.  You will have occasional bloating and cramping that should eventually fade away.  Until you are eating solid food normally, off all pain medications, and back to regular activities; your bowels will not be normal. Focus on eating a low-fat, high  fiber diet the rest of your life (See Getting to [place], below).  CARE of your INCISION or WOUND It is good for closed incision and even open wounds to be washed every day.  Shower every day.  Short baths are fine.  Wash the incisions and wounds clean with soap & water.    If you have a closed incision(s), wash the incision with soap & water every day.  You may leave closed incisions open to air if it is dry.   You may cover the incision with clean gauze & replace it after your daily shower for comfort. If you have skin tapes (Steristrips) or skin glue (Dermabond) on your incision, leave them in place.  They will fall off on their own like a scab.  You may trim any edges that curl up with clean scissors.  If you have staples, set up an appointment for them to be removed in the office in 10 days after surgery.  If you have a drain, wash around the skin exit site with soap & water and place a new dressing of gauze or band aid around the skin every day.  Keep the drain site clean & dry.    If you have an open wound with packing, see wound care instructions.  In general, it is encouraged that you remove your dressing and packing, shower with soap & water, and replace your dressing once a day.  Pack the wound with clean gauze moistened with normal (0.9%) saline to keep the wound moist & uninfected.  Pressure on the dressing for 30 minutes will stop most wound bleeding.  Eventually your body will heal & pull the open wound closed over the next few months.  Raw open wounds will occasionally bleed or secrete yellow drainage until it heals closed.  Drain sites will drain a little until the drain is removed.  Even closed incisions can have mild bleeding or drainage the first few days until the skin edges scab over & seal.   If you have an open wound with a wound vac, see wound vac care instructions.     ACTIVITIES as tolerated Start light daily activities ---  self-care, walking, climbing stairs--  beginning the day after surgery.  Gradually increase activities as tolerated.  Control your pain to be active.  Stop when you are tired.  Ideally, walk several times a day, eventually an hour a day.   Most people are back to most day-to-day activities in a few weeks.  It takes 4-8 weeks to get back to unrestricted, intense activity. If you can walk 30 minutes without difficulty, it is safe to try more intense activity such as jogging, treadmill, bicycling, low-impact aerobics, swimming, etc. Save the most intensive and strenuous activity for last (Usually 4-8 weeks after surgery) such as sit-ups, heavy lifting, contact sports, etc.  Refrain from any intense heavy lifting or straining until you are off narcotics for pain control.  You will have off days, but things should improve week-by-week. DO NOT PUSH THROUGH PAIN.  Let pain be your guide: If it hurts to do something, don't do it.  Pain is your body warning you to avoid that activity for another week until the pain goes down. You may drive when you are no longer taking narcotic prescription pain medication, you can comfortably wear a seatbelt, and you can safely make sudden turns/stops to protect yourself without hesitating due to pain. You may have sexual intercourse when it is comfortable. If it hurts to do something, stop.  MEDICATIONS Take your usually prescribed home medications unless otherwise directed.   Blood thinners:  Usually you can restart any strong blood thinners after the second postoperative day.  It is OK to take aspirin right away.     If you are on strong blood thinners (warfarin/Coumadin, Plavix, Xerelto, Eliquis, Pradaxa, etc), discuss with your surgeon, medicine PCP, and/or cardiologist for instructions on when to restart the blood thinner & if blood monitoring is needed (PT/INR blood check, etc).     PAIN CONTROL Pain after surgery or related to activity is often due to strain/injury to muscle, tendon, nerves and/or  incisions.  This pain is usually short-term and will improve in a few months.  To help speed the process of healing and to get back to regular activity more quickly, DO THE FOLLOWING THINGS TOGETHER: 1. Increase activity gradually.  DO NOT PUSH THROUGH PAIN 2. Use Ice and/or Heat 3. Try Gentle Massage and/or Stretching 4. Take over the counter pain medication 5. Take Narcotic prescription pain medication for more severe pain  Good pain control = faster recovery.  It is better to take more medicine to be more active than to stay in bed all day to avoid medications. 1.  Increase activity gradually Avoid heavy lifting at first, then increase to lifting as tolerated over the next 6 weeks. Do not push through the pain.  Listen to your body and avoid positions and maneuvers than reproduce the pain.  Wait a few days before trying something more intense Walking an hour a day is encouraged to help your body recover faster and more safely.  Start slowly and stop when getting sore.  If you can walk 30 minutes without stopping or pain, you can try more intense activity (running, jogging, aerobics, cycling, swimming, treadmill, sex, sports, weightlifting, etc.) Remember: If it hurts to do it, then dont do it! 2. Use Ice and/or Heat You will have swelling and bruising around the incisions.  This will take several weeks to resolve. Ice packs or heating pads (6-8 times a day, 30-60 minutes at a time) will help sooth soreness & bruising. Some people prefer to use  ice alone, heat alone, or alternate between ice & heat.  Experiment and see what works best for you.  Consider trying ice for the first few days to help decrease swelling and bruising; then, switch to heat to help relax sore spots and speed recovery. Shower every day.  Short baths are fine.  It feels good!  Keep the incisions and wounds clean with soap & water.   3. Try Gentle Massage and/or Stretching Massage at the area of pain many times a  day Stop if you feel pain - do not overdo it 4. Take over the counter pain medication This helps the muscle and nerve tissues become less irritable and calm down faster Choose ONE of the following over-the-counter anti-inflammatory medications: Acetaminophen 549m tabs (Tylenol) 1-2 pills with every meal and just before bedtime (avoid if you have liver problems or if you have acetaminophen in you narcotic prescription) Naproxen 2269mtabs (ex. Aleve, Naprosyn) 1-2 pills twice a day (avoid if you have kidney, stomach, IBD, or bleeding problems) Ibuprofen 20057mabs (ex. Advil, Motrin) 3-4 pills with every meal and just before bedtime (avoid if you have kidney, stomach, IBD, or bleeding problems) Take with food/snack several times a day as directed for at least 2 weeks to help keep pain / soreness down & more manageable. 5. Take Narcotic prescription pain medication for more severe pain A prescription for strong pain control is often given to you upon discharge (for example: oxycodone/Percocet, hydrocodone/Norco/Vicodin, or tramadol/Ultram) Take your pain medication as prescribed. Be mindful that most narcotic prescriptions contain Tylenol (acetaminophen) as well - avoid taking too much Tylenol. If you are having problems/concerns with the prescription medicine (does not control pain, nausea, vomiting, rash, itching, etc.), please call us [phone] see if we need to switch you to a different pain medicine that will work better for you and/or control your side effects better. If you need a refill on your pain medication, you must call the office before 4 pm and on weekdays only.  By federal law, prescriptions for narcotics cannot be called into a pharmacy.  They must be filled out on paper & picked up from our office by the patient or authorized caretaker.  Prescriptions cannot be filled after 4 pm nor on weekends.   WHEN TO CALL US [phone] uncontrolled or worsening pain  Fever  over 101 F (38.5 C) Concerns with the incision: Worsening pain, redness, rash/hives, swelling, bleeding, or drainage Reactions / problems with new medications (itching, rash, hives, nausea, etc.) Nausea and/or vomiting Difficulty urinating Difficulty breathing Worsening fatigue, dizziness, lightheadedness, blurred vision Other concerns If you are not getting better after two weeks or are noticing you are getting worse, contact our office [phone] for further advice.  We may need to adjust your medications, re-evaluate you in the office, send you to the emergency room, or see what other things we can do to help. The clinic staff is available to answer your questions during regular business hours (8:30am-5pm).  Please dont hesitate to call and ask to speak to one of our nurses for clinical concerns.    A surgeon from [facility] is always on call at the hospitals 24 hours/day If you have a medical emergency, go to the nearest emergency room or call 911. FOLLOW UP in our office One the day of your discharge from the hospital (or the next business weekday), please call [facility] to set up or confirm an appointment to see  your surgeon in the office for a follow-up appointment.  Usually it is 2-3 weeks after your surgery.   If you have skin staples at your incision(s), let the office know so we can set up a time in the office for the nurse to remove them (usually around 10 days after surgery). Make sure that you call for appointments the day of discharge (or the next business weekday) from the hospital to ensure a convenient appointment time. IF YOU HAVE DISABILITY OR FAMILY LEAVE FORMS, BRING THEM TO THE OFFICE FOR PROCESSING.  DO NOT GIVE THEM TO YOUR DOCTOR.  [facility] [address], [place], [place], [place]  [address] ? [phone] - Main [phone] - [place],  [phone] - Fax [email or web address]  GETTING TO  GOOD BOWEL HEALTH. It is expected for your digestive tract to need a few months to get back to normal.  It is common for your bowel movements and stools to be irregular.  You will have occasional bloating and cramping that should eventually fade away.  Until you are eating solid food normally, off all pain medications, and back to regular activities; your bowels will not be normal.   Avoiding constipation The goal: ONE SOFT BOWEL MOVEMENT A DAY!    Drink plenty of fluids.  Choose water first. TAKE A FIBER SUPPLEMENT EVERY DAY THE REST OF YOUR LIFE During your first week back home, gradually add back a fiber supplement every day Experiment which form you can tolerate.   There are many forms such as powders, tablets, wafers, gummies, etc Psyllium bran (Metamucil), methylcellulose (Citrucel), Miralax or Glycolax, Benefiber, Flax Seed.  Adjust the dose week-by-week (1/2 dose/day to 6 doses a day) until you are moving your bowels 1-2 times a day.  Cut back the dose or try a different fiber product if it is giving you problems such as diarrhea or bloating. Sometimes a laxative is needed to help jump-start bowels if constipated until the fiber supplement can help regulate your bowels.  If you are tolerating eating & you are farting, it is okay to try a gentle laxative such as double dose MiraLax, prune juice, or Milk of Magnesia.  Avoid using laxatives too often. Stool softeners can sometimes help counteract the constipating effects of narcotic pain medicines.  It can also cause diarrhea, so avoid using for too long. If you are still constipated despite taking fiber daily, eating solids, and a few doses of laxatives, call our office. Controlling diarrhea Try drinking liquids and eating bland foods for a few days to avoid stressing your intestines further. Avoid dairy products (especially milk & ice cream) for a short time.  The intestines often can lose the ability to digest lactose when stressed. Avoid  foods that cause gassiness or bloating.  Typical foods include beans and other legumes, cabbage, broccoli, and dairy foods.  Avoid greasy, spicy, fast foods.  Every person has some sensitivity to other foods, so listen to your body and avoid those foods that trigger problems for you. Probiotics (such as active yogurt, Align, etc) may help repopulate the intestines and colon with normal bacteria and calm down a sensitive digestive tract Adding a fiber supplement gradually can help thicken stools by absorbing excess fluid and retrain the intestines to act more normally.  Slowly increase the dose over a few weeks.  Too much fiber too soon can backfire and cause cramping & bloating. It is okay to try and slow down diarrhea with a few  doses of antidiarrheal medicines.   Bismuth subsalicylate (ex. Kayopectate, Pepto Bismol) for a few doses can help control diarrhea.  Avoid if pregnant.   Loperamide (Imodium) can slow down diarrhea.  Start with one tablet (26m) first.  Avoid if you are having fevers or severe pain.  ILEOSTOMY PATIENTS WILL HAVE CHRONIC DIARRHEA since their colon is not in use.    Drink plenty of liquids.  You will need to drink even more glasses of water/liquid a day to avoid getting dehydrated. Record output from your ileostomy.  Expect to empty the bag every 3-4 hours at first.  Most people with a permanent ileostomy empty their bag 4-6 times at the least.   Use antidiarrheal medicine (especially Imodium) several times a day to avoid getting dehydrated.  Start with a dose at bedtime & breakfast.  Adjust up or down as needed.  Increase antidiarrheal medications as directed to avoid emptying the bag more than 8 times a day (every 3 hours). Work with your wound ostomy nurse to learn care for your ostomy.  See ostomy care instructions. TROUBLESHOOTING IRREGULAR BOWELS 1) Start with a soft & bland diet. No spicy, greasy, or fried foods.  2) Avoid gluten/wheat or dairy products from diet to see if  symptoms improve. 3) Miralax 17gm or flax seed mixed in 8Carey water or juice-daily. May use 2-4 times a day as needed. 4) Gas-X, Phazyme, etc. as needed for gas & bloating.  5) Prilosec (omeprazole) over-the-counter as needed 6)  Consider probiotics (Align, Activa, etc) to help calm the bowels down  Call your doctor if you are getting worse or not getting better.  Sometimes further testing (cultures, endoscopy, X-ray studies, CT scans, bloodwork, etc.) may be needed to help diagnose and treat the cause of the diarrhea. [facility] [place]  [phone]- Main.    [phone] - Toll Free.   [phone] Fax [email or web address]  DRAIN CARE:   You have a closed bulb drain to help you heal.    A bulb drain is a small, plastic reservoir which creates a gentle suction. It is used to remove excess fluid from a surgical wound. The color and amount of fluid will vary. Immediately after surgery, the fluid is bright red. It may gradually change to a yellow color. When the amount decreases to about 1 or 2 tablespoons (15 to 30 cc) per 24 hours, your caregiver will usually remove it.  JP Care  The Jackson-Pratt drainage system has flexible tubing attached to a soft, plastic bulb with a stopper. The drainage end of the tubing, which is flat and white, goes into your body through a small opening near your incision (surgical cut). A stitch holds the drainage end in place. The rest of the tube is outside your body, attached to the bulb. When the bulb is compressed with the stopper in place, it creates a vacuum. This causes a constant gentle suction, which helps draw out fluid that collects under your incision. The bulb should be compressed at all times, except when you are emptying the drainage.  How long you will have your Jackson-Pratt depends on your surgery and the amount of fluid is draining. This is different for  everyone. The Jackson-Pratt is usually removed when the drainage is 30 mL or less over 24 hours. To keep track of how much drainage youre having, you will record the amount in a drainage log. Its important to  bring the log with you to your follow-up appointments.  Caring for Your Jackson-Pratt at Home In order to care for your Jackson-Pratt at home, you or your caregiver will do the following:  Empty the drain once a day and record the color and amount of drainage  Care for the area where the tubing enters your skin by washing with soap and water.  Milk the tubing to help move clots into the bulb.  Do this before you empty and measure your drainage. Look in the mirror at the tubing. This will help you see where your hands need to be. Pinch the tubing close to where it goes into your skin between your thumb and forefinger. With the thumb and forefinger of your other hand, pinch the tubing right below your other fingers. Keep your fingers pinched and slide them down the tubing, pushing any clots down toward the bulb. You may want to use alcohol swabs to help you slide your fingers down the tubing. Repeat steps 3 and 4 as necessary to push clots from the tubing into the bulb. If you are not able to move a clot into the bulb, call your doctors office. The fluid may leak around the insertion site if a clot is blocking the drainage flow. If there is fluid in the bulb and no leakage at the insertion site, the drain is working.  How to Empty Your Jackson-Pratt and Record the Drainage You will need to empty your Jackson-Pratt every day  Gather the following supplies:  Measuring container your nurse gave you Jackson-Pratt Drainage Record  Pen or pencil  Instructions Clean an area to work on. Clean your hands thoroughly. Unplug the stopper on top of your Jackson-Pratt. This will cause the bulb to expand. Do not touch the inside of the stopper or the inner area of the opening on the bulb. Turn  your Jackson-Pratt upside down, gently squeeze the bulb, and pour the drainage into the measuring container. Turn your Jackson-Pratt right side up. Squeeze the bulb until your fingers feel the palm of your hand. Keep squeezing the bulb while you replug the stopper. Make sure the bulb stays fully compressed to ensure constant, gentle suction.    Check the amount and color of drainage in the measuring container. The first couple days after surgery the fluid may be dark red. This is normal. As you heal the fluid may look pink or pale yellow. Record this amount and the color of drainage on your Jackson-Pratt Drainage Record. Flush the drainage down the toilet and rinse the measuring container with water.  Caring for the Insertion Site Once you have emptied the drainage, clean your hands again. Check the area around the insertion site. Look for tenderness, swelling, or pus. If you have any of these, or if you have a temperature of 101 F (38.3 C) or higher, you may have an infection. Call your doctors office.  Sometimes, the drain causes redness the size of a dime at your insertion site. This is normal. Your healthcare provider will tell you if you should place a bandage over the insertion site.      DAILY CARE  Keep the bulb compressed at all times, except while emptying it. The compression creates suction.   Keep sites where the tubes enter the skin dry and covered with a light bandage (dressing).   Tape the tubes to your skin, 1 to 2 inches below the insertion sites, to keep from pulling on your stitches. Tubes are stitched  in place and will not slip out.   Pin the bulb to your shirt (not to your pants) with a safety pin.   For the first few days after surgery, there usually is more fluid in the bulb. Empty the bulb whenever it becomes half full because the bulb does not create enough suction if it is too full. Include this amount in your 24 hour totals.   When the amount of drainage  decreases, empty the bulb at the same time every day. Write down the amounts and the 24 hour totals. Your caregiver will want to know them. This helps your caregiver know when the tubes can be removed.   (We anticipate removing the drain in 1-3 weeks, depending on when the output is <65m a day for 2+ days)  If there is drainage around the tube sites, change dressings and keep the area dry. If you see a clot in the tube, leave it alone. However, if the tube does not appear to be draining, let your caregiver know.  TO EMPTY THE BULB  Open the stopper to release suction.   Holding the stopper out of the way, pour drainage into the measuring cup that was sent home with you.   Measure and write down the amount. If there are 2 bulbs, note the amount of drainage from bulb 1 or bulb 2 and keep the totals separate. Your caregiver will want to know which tube is draining more.   Compress the bulb by folding it in half.   Replace the stopper.   Check the tape that holds the tube to your skin, and pin the bulb to your shirt.  SEEK MEDICAL CARE IF:  The drainage develops a bad odor.   You have an oral temperature above 102 F (38.9 C).   The amount of drainage from your wound suddenly increases or decreases.   You accidentally pull out your drain.   You have any other questions or concerns.  MAKE SURE YOU:   Understand these instructions.   Will watch your condition.   Will get help right away if you are not doing well or get worse.     Call our office if you have any questions about your drain. [place] Irregular bowel habits such as constipation and diarrhea can lead to many problems over time.  Having one soft bowel movement a day is the most important way to prevent further problems.  The anorectal canal is designed to handle stretching and feces to safely manage our ability to get rid of solid waste (feces, poop, stool) out of our body.  BUT,  hard constipated stools can act like ripping concrete bricks and diarrhea can be a burning fire to this very sensitive area of our body, causing inflamed hemorrhoids, anal fissures, increasing risk is perirectal abscesses, abdominal pain/bloating, an making irritable bowel worse.      The goal: ONE SOFT BOWEL MOVEMENT A DAY!  To have soft, regular bowel movements:   Drink plenty of fluids, consider 4-6 tall glasses of water a day.    Take plenty of fiber.  Fiber is the undigested part of plant food that passes into the colon, acting s natures broom to encourage bowel motility and movement.  Fiber can absorb and hold large amounts of water. This results in a larger, bulkier stool, which is soft and easier to pass. Work gradually over several weeks up to 6 servings a day of fiber (25g a  day even more if needed) in the form of: o Vegetables -- Root (potatoes, carrots, turnips), leafy green (lettuce, salad greens, celery, spinach), or cooked high residue (cabbage, broccoli, etc) o Fruit -- Fresh (unpeeled skin & pulp), Dried (prunes, apricots, cherries, etc ),  or stewed ( applesauce)  o Whole grain breads, pasta, etc (whole wheat)  o Bran cereals   Bulking Agents -- This type of water-retaining fiber generally is easily obtained each day by one of the following:  o Psyllium bran -- The psyllium plant is remarkable because its ground seeds can retain so much water. This product is available as Metamucil, Konsyl, Effersyllium, Per Diem Fiber, or the less expensive generic preparation in drug and health food stores. Although labeled a laxative, it really is not a laxative.  o Methylcellulose -- This is another fiber derived from wood which also retains water. It is available as Citrucel. o Polyethylene Glycol - and artificial fiber commonly called Miralax or Glycolax.  It is helpful for people with gassy or bloated feelings with regular fiber o Flax Seed - a less gassy fiber than psyllium  No reading  or other relaxing activity while on the toilet. If bowel movements take longer than 5 minutes, you are too constipated  AVOID CONSTIPATION.  High fiber and water intake usually takes care of this.  Sometimes a laxative is needed to stimulate more frequent bowel movements, but   Laxatives are not a good long-term solution as it can wear the colon out.  They can help jump-start bowels if constipated, but should be relied on constantly without discussing with your doctor o Osmotics (Milk of Magnesia, Fleets phosphosoda, Magnesium citrate, MiraLax, GoLytely) are safer than  o Stimulants (Senokot, Castor Oil, Dulcolax, Ex Lax)    o Avoid taking laxatives for more than 7 days in a row.   IF SEVERELY CONSTIPATED, try a Bowel Retraining Program: o Do not use laxatives.  o Eat a diet high in roughage, such as bran cereals and leafy vegetables.  o Drink six (6) ounces of prune or apricot juice each morning.  o Eat two (2) large servings of stewed fruit each day.  o Take one (1) heaping tablespoon of a psyllium-based bulking agent twice a day. Use sugar-free sweetener when possible to avoid excessive calories.  o Eat a normal breakfast.  o Set aside 15 minutes after breakfast to sit on the toilet, but do not strain to have a bowel movement.  o If you do not have a bowel movement by the third day, use an enema and repeat the above steps.   Controlling diarrhea o Switch to liquids and simpler foods for a few days to avoid stressing your intestines further. o Avoid dairy products (especially milk & ice cream) for a short time.  The intestines often can lose the ability to digest lactose when stressed. o Avoid foods that cause gassiness or bloating.  Typical foods include beans and other legumes, cabbage, broccoli, and dairy foods.  Every person has some sensitivity to other foods, so listen to our body and avoid those foods that trigger problems for you. o Adding fiber (Citrucel, Metamucil, psyllium,  Miralax) gradually can help thicken stools by absorbing excess fluid and retrain the intestines to act more normally.  Slowly increase the dose over a few weeks.  Too much fiber too soon can backfire and cause cramping & bloating. o Probiotics (such as active yogurt, Align, etc) may help repopulate the intestines and colon with normal bacteria  and calm down a sensitive digestive tract.  Most studies show it to be of mild help, though, and such products can be costly. o Medicines: - Bismuth subsalicylate (ex. Kayopectate, Pepto Bismol) every 30 minutes for up to 6 doses can help control diarrhea.  Avoid if pregnant. - Loperamide (Immodium) can slow down diarrhea.  Start with two tablets (72m total) first and then try one tablet every 6 hours.  Avoid if you are having fevers or severe pain.  If you are not better or start feeling worse, stop all medicines and call your doctor for advice o Call your doctor if you are getting worse or not better.  Sometimes further testing (cultures, endoscopy, X-ray studies, bloodwork, etc) may be needed to help diagnose and treat the cause of the diarrhea.  TROUBLESHOOTING IRREGULAR BOWELS 1) Avoid extremes of bowel movements (no bad constipation/diarrhea) 2) Miralax 17gm mixed in 8oz. water or juice-daily. May use BID as needed.  3) Gas-x,Phazyme, etc. as needed for gas & bloating.  4) Soft,bland diet. No spicy,greasy,fried foods.  5) Prilosec over-the-counter as needed  6) May hold gluten/wheat products from diet to see if symptoms improve.  7)  May try probiotics (Align, Activa, etc) to help calm the bowels down 7) If symptoms become worse call back immediately.

## 2015-11-17 NOTE — Anesthesia Preprocedure Evaluation (Signed)
Anesthesia Evaluation  Patient identified by MRN, date of birth, ID band Patient awake    Reviewed: Allergy & Precautions, NPO status , Patient's Chart, lab work & pertinent test results  History of Anesthesia Complications (+) PONV and history of anesthetic complications  Airway Mallampati: II  TM Distance: >3 FB Neck ROM: Full    Dental  (+) Teeth Intact   Pulmonary neg shortness of breath, neg sleep apnea, neg COPD, neg recent URI, former smoker, neg PE   breath sounds clear to auscultation       Cardiovascular hypertension, Pt. on medications (-) angina(-) Past MI and (-) CHF + Valvular Problems/Murmurs MVP  Rhythm:Regular     Neuro/Psych PSYCHIATRIC DISORDERS Anxiety Depression negative neurological ROS     GI/Hepatic Neg liver ROS, hiatal hernia, GERD  Medicated and Controlled,  Endo/Other  Morbid obesity  Renal/GU negative Renal ROS     Musculoskeletal  (+) Arthritis ,   Abdominal   Peds  Hematology   Anesthesia Other Findings   Reproductive/Obstetrics                             Anesthesia Physical Anesthesia Plan  ASA: III  Anesthesia Plan: General   Post-op Pain Management:    Induction: Intravenous  Airway Management Planned: Oral ETT  Additional Equipment: None  Intra-op Plan:   Post-operative Plan: Extubation in OR  Informed Consent: I have reviewed the patients History and Physical, chart, labs and discussed the procedure including the risks, benefits and alternatives for the proposed anesthesia with the patient or authorized representative who has indicated his/her understanding and acceptance.   Dental advisory given  Plan Discussed with: CRNA and Surgeon  Anesthesia Plan Comments:         Anesthesia Quick Evaluation

## 2015-11-17 NOTE — Interval H&P Note (Signed)
History and Physical Interval Note:  11/17/2015 7:05 AM  Lindsay Little  has presented today for surgery, with the diagnosis of Fistula at colorectal anastomosis  The various methods of treatment have been discussed with the patient and family. After consideration of risks, benefits and other options for treatment, the patient has consented to  Procedure(s): TRANSANAL ENDOSCOPIC MICROSURGERY UNROOfING AND CLOSURE OF COLORECTAL FISTULA  (N/A) LAPAROSCOPIC DIVERTING LOOP ILEOSTOMY  (N/A) as a surgical intervention .  The patient's history has been reviewed, patient examined, no change in status, stable for surgery.  I have reviewed the patient's chart and labs.  Questions were answered to the patient's satisfaction.     [name] C.

## 2015-11-18 LAB — CBC
HEMATOCRIT: 29.3 % — AB (ref 36.0–46.0)
Hemoglobin: 9.5 g/dL — ABNORMAL LOW (ref 12.0–15.0)
MCH: 27.9 pg (ref 26.0–34.0)
MCHC: 32.4 g/dL (ref 30.0–36.0)
MCV: 86.2 fL (ref 78.0–100.0)
PLATELETS: 423 10*3/uL — AB (ref 150–400)
RBC: 3.4 MIL/uL — ABNORMAL LOW (ref 3.87–5.11)
RDW: 14.6 % (ref 11.5–15.5)
WBC: 12.9 10*3/uL — AB (ref 4.0–10.5)

## 2015-11-18 LAB — BASIC METABOLIC PANEL
Anion gap: 8 (ref 5–15)
BUN: 10 mg/dL (ref 6–20)
CHLORIDE: 105 mmol/L (ref 101–111)
CO2: 27 mmol/L (ref 22–32)
Calcium: 9.3 mg/dL (ref 8.9–10.3)
Creatinine, Ser: 0.62 mg/dL (ref 0.44–1.00)
GFR calc Af Amer: >60 mL/min (ref >60)
Glucose, Bld: 142 mg/dL — ABNORMAL HIGH (ref 65–99)
POTASSIUM: 4.5 mmol/L (ref 3.5–5.1)
SODIUM: 140 mmol/L (ref 135–145)

## 2015-11-18 LAB — MAGNESIUM: Magnesium: 2 mg/dL (ref 1.7–2.4)

## 2015-11-18 MED ORDER — LIDOCAINE-PRILOCAINE 2.5-2.5 % EX CREA
TOPICAL_CREAM | Freq: Four times a day (QID) | CUTANEOUS | Status: DC | PRN
  Filled 2015-11-18: qty 5

## 2015-11-18 MED ORDER — SODIUM CHLORIDE 0.9% FLUSH
10.0000 mL | INTRAVENOUS | Status: DC | PRN
  Administered 2015-11-22: 10 mL
  Administered 2015-11-22: 20 mL
  Administered 2015-11-23: 10 mL
  Filled 2015-11-18 (×3): qty 40

## 2015-11-18 MED ORDER — SODIUM CHLORIDE 0.9 % IV SOLN
6.0000 mg | INTRAVENOUS | Status: DC | PRN
  Administered 2015-11-18 – 2015-11-21 (×11): 6 mg via INTRAVENOUS
  Filled 2015-11-18 (×22): qty 3

## 2015-11-18 NOTE — Progress Notes (Signed)
Pt vomited 325ccs of green, bilious liquid. Explained to patient she was showing signs of an ileus, and that she needed to increase her mobility. Pt understands and was able to dangle and stand at the bedside for about 5 minutes. Pt did not want to ambulate at this time. Agrees to try in the early am. Pain medicine given after pt returned to bed.  Will continue to monitor.   Roselind Rily

## 2015-11-18 NOTE — Progress Notes (Signed)
[name], female   DOB: [birth year], 50 y.o.   MRN: [id number]  [facility]  Subjective: POD#1 - patient in bed, family at bedside.  Moderate pain.  Taking limited clear liquids.  Has not been OOB yet.  Ileostomy bag leaked last night, now fixed.  Objective: Vital signs in last 24 hours: Temp:  [97.4 F (36.3 C)-98.6 F (37 C)] 98.6 F (37 C) (05/06 0550) Pulse Rate:  [55-87] 61 (05/06 0550) Resp:  [14-19] 15 (05/06 0550) BP: (98-134)/(49-77) 106/57 mmHg (05/06 0550) SpO2:  [95 %-100 %] 97 % (05/06 0550) Weight:  [128.25 kg (282 lb 11.8 oz)-136.3 kg (300 lb 7.8 oz)] 136.3 kg (300 lb 7.8 oz) (05/06 0449) Last BM Date: 11/17/15  Intake/Output from previous day: 05/05 0701 - 05/06 0700 In: 4840 [P.O.:240; I.V.:4600] Out: 84 [Urine:680; Drains:305; Blood:100] Intake/Output this shift:    Physical Exam: HEENT - sclerae clear, mucous membranes moist Neck - soft Abdomen - soft, obese; dressings dry and intact; small serosanguinous in JP drain; ostomy viable with thin liquid in bag Neuro - alert & oriented, no focal deficits  Lab Results:   Recent Labs  11/18/15 0428  WBC 12.9*  HGB 9.5*  HCT 29.3*  PLT 423*   BMET  Recent Labs  11/18/15 0428  NA 140  K 4.5  CL 105  CO2 27  GLUCOSE 142*  BUN 10  CREATININE 0.62  CALCIUM 9.3   PT/INR No results for input(s): LABPROT, INR in the last 72 hours. Comprehensive Metabolic Panel:    Component Value Date/Time   NA 140 11/18/2015 0428   NA 139 11/13/2015 1200   NA 138 11/10/2015 1007   NA 141 09/29/2015 1424   K 4.5 11/18/2015 0428   K 4.0 11/13/2015 1200   K 3.8 11/10/2015 1007   K 3.7 09/29/2015 1424   CL 105 11/18/2015 0428   CL 101 11/13/2015 1200   CO2 27 11/18/2015 0428   CO2 26 11/13/2015 1200   CO2 24 11/10/2015 1007   CO2 25 09/29/2015 1424   BUN 10 11/18/2015 0428   BUN 7 11/13/2015 1200   BUN 7.4 11/10/2015 1007   BUN 10.8 09/29/2015 1424    CREATININE 0.62 11/18/2015 0428   CREATININE 0.74 11/13/2015 1200   CREATININE 0.7 11/10/2015 1007   CREATININE 0.8 09/29/2015 1424   GLUCOSE 142* 11/18/2015 0428   GLUCOSE 146* 11/13/2015 1200   GLUCOSE 127 11/10/2015 1007   GLUCOSE 144* 09/29/2015 1424   CALCIUM 9.3 11/18/2015 0428   CALCIUM 9.8 11/13/2015 1200   CALCIUM 10.0 11/10/2015 1007   CALCIUM 10.3 09/29/2015 1424   AST 10 11/10/2015 1007   AST 11 09/29/2015 1424   AST 16 09/16/2015 0414   AST 21 03/08/2015 1217   ALT <9 11/10/2015 1007   ALT <9 09/29/2015 1424   ALT 16 09/16/2015 0414   ALT 19 03/08/2015 1217   ALKPHOS 69 11/10/2015 1007   ALKPHOS 61 09/29/2015 1424   ALKPHOS 54 09/16/2015 0414   ALKPHOS 53 03/08/2015 1217   BILITOT 0.40 11/10/2015 1007   BILITOT 0.36 09/29/2015 1424   BILITOT 0.5 09/16/2015 0414   BILITOT 0.4 03/08/2015 1217   PROT 7.4 11/10/2015 1007   PROT 7.9 09/29/2015 1424   PROT 6.2* 09/16/2015 0414   PROT 7.0 03/08/2015 1217   ALBUMIN 3.0* 11/10/2015 1007   ALBUMIN 3.2* 09/29/2015 1424   ALBUMIN 3.0* 09/16/2015 0414   ALBUMIN 3.6 03/08/2015 1217  Studies/Results: No results found.  Assessment & Plans: Status post diverting loop ileostomy, lysis of adhesions, drain placement  Clear liquid diet  OOB to chair today  IS use - ordered  Ileostomy care and instruction  Cancel PICC order - patient prefers to use existing infusion port  Earnstine Regal, MD, [facility] Office: [place] 11/18/2015

## 2015-11-18 NOTE — Progress Notes (Signed)
Pt ileostomy continues to leak through out the night, ostomy changed with increase leakage noted. Wound RN consult today, will continue to monitor. Pt refused to walk and dangle, last night. Made her aware of the importance of walking after surgery. I will continue to monitor, states she will try on her own when she is ready.

## 2015-11-19 MED ORDER — METOCLOPRAMIDE HCL 5 MG/ML IJ SOLN
10.0000 mg | Freq: Four times a day (QID) | INTRAMUSCULAR | Status: DC | PRN
  Administered 2015-11-19 – 2015-11-20 (×3): 10 mg via INTRAVENOUS
  Filled 2015-11-19 (×3): qty 2

## 2015-11-19 NOTE — Progress Notes (Signed)
Report to Janett Billow, RN, to assume care of patient at this time.

## 2015-11-19 NOTE — Progress Notes (Signed)
Pt vomited again, brown emesis, but this time vomited 1050ccs. Pt again advised that it was imperative she begin ambulating to help with nausea. Pt again refused, but did stand at the bedside again.  Lindsay Little

## 2015-11-19 NOTE — Progress Notes (Signed)
[name], female   DOB: 02-05-1966, 50 y.o.   MRN: [id number]  [facility]  Subjective: POD#2 - nausea and emesis overnight.  Did not ambulate, stood at bedside.  Nausea controlled with Zofran this AM.  Objective: Vital signs in last 24 hours: Temp:  [97.7 F (36.5 C)-98.6 F (37 C)] 98.3 F (36.8 C) (05/07 0544) Pulse Rate:  [66-108] 74 (05/07 0544) Resp:  [14-16] 16 (05/07 0544) BP: (126-130)/(72-85) 129/85 mmHg (05/07 0544) SpO2:  [93 %-96 %] 96 % (05/07 0544) Weight:  [134.4 kg (296 lb 4.8 oz)] 134.4 kg (296 lb 4.8 oz) (05/07 0503) Last BM Date: 11/18/15  Intake/Output from previous day: 05/06 0701 - 05/07 0700 In: 1526 [P.O.:100; I.V.:1200; IV Piggyback:226] Out: 0539 [Urine:1110; Emesis/NG output:1375; Drains:150; Stool:1225] Intake/Output this shift: Total I/O In: -  Out: 570 [Drains:45; Stool:525]  Physical Exam: HEENT - sclerae clear, mucous membranes moist Abdomen - soft, quiet; ostomy viable with thin output; JP drain with small sanguinous Neuro - alert & oriented, no focal deficits  Lab Results:   Recent Labs  11/18/15 0428  WBC 12.9*  HGB 9.5*  HCT 29.3*  PLT 423*   BMET  Recent Labs  11/18/15 0428  NA 140  K 4.5  CL 105  CO2 27  GLUCOSE 142*  BUN 10  CREATININE 0.62  CALCIUM 9.3   PT/INR No results for input(s): LABPROT, INR in the last 72 hours. Comprehensive Metabolic Panel:    Component Value Date/Time   NA 140 11/18/2015 0428   NA 139 11/13/2015 1200   NA 138 11/10/2015 1007   NA 141 09/29/2015 1424   K 4.5 11/18/2015 0428   K 4.0 11/13/2015 1200   K 3.8 11/10/2015 1007   K 3.7 09/29/2015 1424   CL 105 11/18/2015 0428   CL 101 11/13/2015 1200   CO2 27 11/18/2015 0428   CO2 26 11/13/2015 1200   CO2 24 11/10/2015 1007   CO2 25 09/29/2015 1424   BUN 10 11/18/2015 0428   BUN 7 11/13/2015 1200   BUN 7.4 11/10/2015 1007   BUN 10.8 09/29/2015 1424   CREATININE 0.62  11/18/2015 0428   CREATININE 0.74 11/13/2015 1200   CREATININE 0.7 11/10/2015 1007   CREATININE 0.8 09/29/2015 1424   GLUCOSE 142* 11/18/2015 0428   GLUCOSE 146* 11/13/2015 1200   GLUCOSE 127 11/10/2015 1007   GLUCOSE 144* 09/29/2015 1424   CALCIUM 9.3 11/18/2015 0428   CALCIUM 9.8 11/13/2015 1200   CALCIUM 10.0 11/10/2015 1007   CALCIUM 10.3 09/29/2015 1424   AST 10 11/10/2015 1007   AST 11 09/29/2015 1424   AST 16 09/16/2015 0414   AST 21 03/08/2015 1217   ALT <9 11/10/2015 1007   ALT <9 09/29/2015 1424   ALT 16 09/16/2015 0414   ALT 19 03/08/2015 1217   ALKPHOS 69 11/10/2015 1007   ALKPHOS 61 09/29/2015 1424   ALKPHOS 54 09/16/2015 0414   ALKPHOS 53 03/08/2015 1217   BILITOT 0.40 11/10/2015 1007   BILITOT 0.36 09/29/2015 1424   BILITOT 0.5 09/16/2015 0414   BILITOT 0.4 03/08/2015 1217   PROT 7.4 11/10/2015 1007   PROT 7.9 09/29/2015 1424   PROT 6.2* 09/16/2015 0414   PROT 7.0 03/08/2015 1217   ALBUMIN 3.0* 11/10/2015 1007   ALBUMIN 3.2* 09/29/2015 1424   ALBUMIN 3.0* 09/16/2015 0414   ALBUMIN 3.6 03/08/2015 1217    Studies/Results: No results found.  Assessment & Plans: Status post  diverting loop ileostomy, lysis of adhesions, drain placement Continue clear liquid diet OOB to chair, ambulate in halls if possible IS use Ileostomy care and instruction Rx nausea/emesis with Zofran  Earnstine Regal, MD, [facility] Office: [place] 11/19/2015

## 2015-11-19 NOTE — Progress Notes (Signed)
Assumed care of patient from [place], [place]. Pt resting. Significant other at bedside.

## 2015-11-20 MED ORDER — HYDROMORPHONE HCL 2 MG PO TABS
4.0000 mg | ORAL_TABLET | ORAL | Status: DC | PRN
  Administered 2015-11-22: 8 mg via ORAL
  Administered 2015-11-22: 4 mg via ORAL
  Administered 2015-11-22 – 2015-11-23 (×4): 8 mg via ORAL
  Filled 2015-11-20: qty 4
  Filled 2015-11-20: qty 2
  Filled 2015-11-20 (×4): qty 4

## 2015-11-20 MED ORDER — ACETAMINOPHEN 500 MG PO TABS
1000.0000 mg | ORAL_TABLET | Freq: Three times a day (TID) | ORAL | Status: DC
  Filled 2015-11-20 (×4): qty 2

## 2015-11-20 MED ORDER — LOPERAMIDE HCL 2 MG PO CAPS
2.0000 mg | ORAL_CAPSULE | Freq: Every day | ORAL | Status: DC
  Administered 2015-11-20: 2 mg via ORAL
  Filled 2015-11-20 (×2): qty 1

## 2015-11-20 MED ORDER — FERROUS SULFATE 325 (65 FE) MG PO TABS
325.0000 mg | ORAL_TABLET | Freq: Two times a day (BID) | ORAL | Status: DC
  Filled 2015-11-20 (×5): qty 1

## 2015-11-20 MED ORDER — BISMUTH SUBSALICYLATE 262 MG/15ML PO SUSP
30.0000 mL | Freq: Two times a day (BID) | ORAL | Status: DC
  Administered 2015-11-20 (×2): 30 mL via ORAL
  Filled 2015-11-20: qty 118

## 2015-11-20 NOTE — Progress Notes (Signed)
[facility]  Patient Status:   New pt for [facility] this admission  [facility] is providing the following services:   HHRN and [facility] for home IV ABX.  [facility] hospital coordinator will provide in hospital IV ABX teaching with pt/family to support independence upon DC to home.  [facility] will follow until DC to support transition home.   If patient discharges after hours, please call [phone].   Larry Sierras 11/20/2015, 10:45 AM

## 2015-11-20 NOTE — Evaluation (Signed)
Occupational Therapy Evaluation Patient Details Name: Lindsay Little MRN: [id number] DOB: [birth year] Today's Date: 11/20/2015    History of Present Illness Ptis 50 yr old female with 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 Pt had surgery per Dr. Johney Maine   Clinical Impression   Pt admitted for pelvic abscess. Pt currently with functional limitations due to the deficits listed below (see OT Problem List).  Pt will benefit from skilled OT to increase their safety and independence with ADL and functional mobility for ADL to facilitate discharge to venue listed below.      Follow Up Recommendations  No OT follow up    Equipment Recommendations  None recommended by OT       Precautions / Restrictions Precautions Precautions: None Restrictions Weight Bearing Restrictions: No      Mobility Bed Mobility Overal bed mobility: Independent                Transfers Overall transfer level: Needs assistance   Transfers: Sit to/from Stand;Stand Pivot Transfers Sit to Stand: Supervision Stand pivot transfers: Supervision       General transfer comment: VC for safety.         ADL Overall ADL's : Needs assistance/impaired     Grooming: Set up;Standing   Upper Body Bathing: Set up;Sitting   Lower Body Bathing: Moderate assistance;Sit to/from stand   Upper Body Dressing : Set up;Sitting   Lower Body Dressing: Moderate assistance;Sit to/from stand Lower Body Dressing Details (indicate cue type and reason): husband will a as needed.  OT mentioned option of AE, but pt did not seem interested- but OT did mention option of using AE for picking item off floor.  Husband has a Optician, dispensing Details (indicate cue type and reason): pt had been in bathroom prior to OT coming to room           General ADL Comments: Husband will A as needed.He is disabled and is home 24/7 Explained to pt how important OOB                Pertinent  Vitals/Pain Pain Assessment: 0-10 Pain Score: 2  Pain Descriptors / Indicators: Sore Pain Intervention(s): Monitored during session;Repositioned        Extremity/Trunk Assessment Upper Extremity Assessment Upper Extremity Assessment: Overall WFL for tasks assessed           Communication Communication Communication: No difficulties   Cognition Arousal/Alertness: Awake/alert Behavior During Therapy: WFL for tasks assessed/performed Overall Cognitive Status: Within Functional Limits for tasks assessed                     General Comments   Reiterated importance of getting OOB. Pt verbalized understanding            Home Living Family/patient expects to be discharged to:: Private residence Living Arrangements: Spouse/significant other Available Help at Discharge: Family Type of Home: House Home Access: Stairs to enter Technical brewer of Steps: 4 Entrance Stairs-Rails: Right Home Layout: One level     Bathroom Shower/Tub: Corporate investment banker: [place]: None          Prior Functioning/Environment Level of Independence: Independent             OT Diagnosis: Generalized weakness   OT Problem List: Decreased strength   OT Treatment/Interventions: Self-care/ADL training;DME and/or AE instruction;Patient/family education    OT Goals(Current goals can be found in the care plan  section) Acute Rehab OT Goals Patient Stated Goal: be able to back to work OT Goal Formulation: With patient Time For Goal Achievement: 12/04/15 Potential to Achieve Goals: Good ADL Goals Pt Will Perform Lower Body Dressing: with modified independence;sit to/from stand Pt Will Transfer to Toilet: with modified independence;regular height toilet Pt Will Perform Toileting - Clothing Manipulation and hygiene: with modified independence;sit to/from stand  OT Frequency: Min 2X/week              End of Session Nurse  Communication: Mobility status  Activity Tolerance: Patient tolerated treatment well Patient left: in chair;with call bell/phone within reach;with family/visitor present   Time: 8871-9597 OT Time Calculation (min): 25 min Charges:  OT General Charges $OT Visit: 1 Procedure OT Evaluation $OT Eval Moderate Complexity: 1 Procedure OT Treatments $Self Care/Home Management : 8-22 mins G-Codes:    Payton Mccallum D 12-16-2015, 10:07 AM

## 2015-11-20 NOTE — Progress Notes (Signed)
[facility]  [address] [place], [place] [address] Phone: [phone] FAX: [phone]   Lindsay Little [id number] June 05, 1966  Problem List:   Principal Problem:   Large bowel anastomotic leak Active Problems:   Rectal adenocarcinoma s/p LAR resection 05/25/2015   Ileostomy in place [facility])   Morbid obesity with BMI of 50.0-59.9, adult ([place])   Crohn's disease ([place])   Hypertension   IBS (irritable bowel syndrome)   Depression   Pelvic abscess s/p drainage & omental pedicle flap 11/17/2015   Protein-calorie malnutrition, moderate ([facility])   Pelvirectal abscess ([place])   3 Days Post-Op  11/17/2015  POST-OPERATIVE DIAGNOSIS:   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  Irritated acrochordon right lower quadrant panniculus   PROCEDURE:   LAPAROSCOPIC lysis of adhesions 60 minutes DIVERTING LOOP ILEOSTOMY  DRAINAGE OF PELVIC ABSCESS OMENTAL PEDICLE FLAP TO DEEP PELVIS EXAM UNDER ANESTHESIA WITH DISIMPACTION  EXCISION OF SKIN TAG  Surgeon(s): Michael Boston, MD  ASSISTANT: RN    Assessment  Recovering okay so far.  Plan:  -adv diet -antidiarrheal regimen -ostomy care/training -f/u path on abscess/SB serosal Bxs -Need HH setup for home/outpt IVF infusions 2L q MWF x 6 weeks to avoid readmission for ARF/dehydration until intestine adapts & diarrheal slows down -switch to PO pain control -anxiolysis -antidepression -VTE prophylaxis- SCDs, etc -mobilize as tolerated to help recovery  I updated the patient's status to the patient and spouse.  Recommendations were made.  Questions were answered.  They expressed understanding & appreciation.   [name], M.D., F.A.C.S. Gastrointestinal and Minimally Invasive Surgery [facility] [address], [place] [address] [place], [place] [address] [phone] Main / Paging   11/20/2015  Subjective:  Sore.  IV Dilaudid  helps.  Staying in bed.  Afraid to get up side from going to the bathroom.  Was nauseated.  Now having bowel movements and ileostomy.  Less nausea now.  Husband at bedside.  Objective:  Vital signs:  Filed Vitals:   11/19/15 1530 11/19/15 1800 11/19/15 2109 11/20/15 0546  BP: 102/68  135/72 127/71  Pulse: 109 66 82 80  Temp: 98.1 F (36.7 C)  97.2 F (36.2 C) 98.2 F (36.8 C)  TempSrc:   Axillary Oral  Resp: 18  18 18   Height:      Weight:    130.6 kg (287 lb 14.7 oz)  SpO2: 98%  94% 95%    Last BM Date: 11/19/15  Intake/Output   Yesterday:  05/07 0701 - 05/08 0700 In: 1959 [P.O.:600; I.V.:1150; IV Piggyback:209] Out: 3102 [Drains:127; Stool:2975] This shift:     Bowel function:  Flatus: y  BM: y  Drain: serosanguinous  Physical Exam:  General: Pt awake/alert/oriented x4 in no acute distress Eyes: PERRL, normal EOM.  Sclera clear.  No icterus Neuro: CN II-XII intact w/o focal sensory/motor deficits. Lymph: No head/neck/groin lymphadenopathy Psych:  No delerium/psychosis/paranoia.  Anxious.  Shaking her left foot again. HENT: Normocephalic, Mucus membranes moist.  No thrush Neck: Supple, No tracheal deviation Chest:  No chest wall pain w good excursion CV:  Pulses intact.  Regular rhythm MS: Normal AROM mjr joints.  No obvious deformity Abdomen: Soft.  Nondistended.  Ileostomy pink.  1 cm ridge.  Thin bilious effluent in bag.  .Mildly tender at incisions only.  No evidence of peritonitis.  No incarcerated hernias. Ext:  SCDs BLE.  No mjr edema.  No cyanosis Skin: No petechiae / purpura  Results:  Labs: No results found for this or any previous visit (from the past 48 hour(s)).  Imaging / Studies: No results found.  Medications / Allergies: per chart  Antibiotics: Anti-infectives    Start     Dose/Rate Route Frequency Ordered Stop   11/17/15 1600  clindamycin (CLEOCIN) IVPB 900 mg     900 mg 100 mL/hr over 30 Minutes Intravenous Every 8 hours  11/17/15 1313 11/17/15 1728   11/17/15 1050  clindamycin (CLEOCIN) 900 mg, gentamicin (GARAMYCIN) 240 mg in sodium chloride 0.9 % 1,000 mL for intraperitoneal lavage  Status:  Discontinued       As needed 11/17/15 1050 11/17/15 1108   11/16/15 1430  clindamycin (CLEOCIN) 900 mg, gentamicin (GARAMYCIN) 240 mg in sodium chloride 0.9 % 1,000 mL for intraperitoneal lavage  Status:  Discontinued    Comments:  Pharmacy may adjust dosing strength, schedule, rate of infusion, etc as needed to optimize therapy    Intraperitoneal To Surgery 11/16/15 1417 11/17/15 1259   11/16/15 1416  clindamycin (CLEOCIN) IVPB 900 mg     900 mg 100 mL/hr over 30 Minutes Intravenous 60 min pre-op 11/16/15 1417 11/17/15 0744   11/16/15 1416  gentamicin (GARAMYCIN) 480 mg in dextrose 5 % 100 mL IVPB     480 mg 112 mL/hr over 60 Minutes Intravenous 60 min pre-op 11/16/15 1417 11/17/15 0757        Note: Portions of this report may have been transcribed using voice recognition software. Every effort was made to ensure accuracy; however, inadvertent computerized transcription errors may be present.   Any transcriptional errors that result from this process are unintentional.     [name], M.D., F.A.C.S. Gastrointestinal and Minimally Invasive Surgery [facility] [address] [place], [place] [address] [phone] Main / Paging   11/20/2015  CARE TEAM:  PCP: Vidal Schwalbe, MD  Outpatient Care Team: Patient Care Team: [name], MD as PCP - General (Family Medicine) Michael Boston, MD as Consulting Physician (General Surgery) Truitt Merle, MD as Consulting Physician (Medical Oncology) Kyung Rudd, MD as Consulting Physician (Radiation Oncology) Teena Irani, MD as Consulting Physician (Gastroenterology)  Inpatient Treatment Team: Treatment Team: Attending Provider: Michael Boston, MD; Technician: Tenna Child, NT

## 2015-11-20 NOTE — Progress Notes (Signed)
PT Cancellation Note  Patient Details Name: Lindsay Little MRN: [id number] DOB: 09/24/1965   Cancelled Treatment:    Reason Eval/Treat Not Completed: Pain limiting ability to participate RN reports pt with pain and nausea, about to give meds.  Will check back as schedule permits.   Merelin Human,[name] 11/20/2015, 10:12 AM [name], PT, DPT 11/20/2015 Pager: [phone]

## 2015-11-20 NOTE — Evaluation (Signed)
Physical Therapy One Time Evaluation Patient Details Name: Lindsay Little MRN: [id number] DOB: 11/20/1965 Today's Date: 11/20/2015   History of Present Illness  Ptis 50 yr old female with 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 Pt status post diverting loop ileostomy, lysis of adhesions, drain placement 11/17/15  Clinical Impression  Patient evaluated by Physical Therapy with no further acute PT needs identified. All education has been completed and the patient has no further questions.  Pt mobilizing well and encouraged to ambulate with nursing staff and/or spouse for remainder of acute stay. See below for any follow-up Physical Therapy or equipment needs. PT is signing off. Thank you for this referral.     Follow Up Recommendations No PT follow up    Equipment Recommendations  None recommended by PT    Recommendations for Other Services       Precautions / Restrictions Precautions Precautions: None      Mobility  Bed Mobility Overal bed mobility: Needs Assistance Bed Mobility: Supine to Sit     Supine to sit: Min assist     General bed mobility comments: spouse provided pull up trunk assist, appeared min assist  Transfers Overall transfer level: Needs assistance Equipment used: None Transfers: Sit to/from Stand Sit to Stand: Supervision            Ambulation/Gait Ambulation/Gait assistance: Min guard;Supervision Ambulation Distance (Feet): 400 Feet Assistive device: Rolling walker (2 wheeled) Gait Pattern/deviations: Step-through pattern     General Gait Details: slow but steady gait, pt pushed IV pole, denies symptoms or increase in pain  Stairs            Wheelchair Mobility    Modified Rankin (Stroke Patients Only)       Balance                                             Pertinent Vitals/Pain Pain Assessment: 0-10 Pain Score: 2  Pain Location: abdomen Pain Descriptors / Indicators: Sore Pain  Intervention(s): Limited activity within patient's tolerance;Monitored during session;Premedicated before session    Home Living Family/patient expects to be discharged to:: Private residence Living Arrangements: Spouse/significant other Available Help at Discharge: Family Type of Home: House Home Access: Stairs to enter Entrance Stairs-Rails: Right Entrance Stairs-Number of Steps: 4 Home Layout: One level Home Equipment: None Additional Comments: spouse has cane    Prior Function Level of Independence: Independent               Hand Dominance        Extremity/Trunk Assessment               Lower Extremity Assessment: Overall WFL for tasks assessed         Communication   Communication: No difficulties  Cognition Arousal/Alertness: Awake/alert Behavior During Therapy: WFL for tasks assessed/performed Overall Cognitive Status: Within Functional Limits for tasks assessed                      General Comments      Exercises        Assessment/Plan    PT Assessment Patent does not need any further PT services  PT Diagnosis Difficulty walking   PT Problem List    PT Treatment Interventions     PT Goals (Current goals can be found in the Care Plan section) Acute Rehab PT Goals  PT Goal Formulation: All assessment and education complete, DC therapy    Frequency     Barriers to discharge        Co-evaluation               End of Session   Activity Tolerance: Patient tolerated treatment well Patient left: in bed;with call bell/phone within reach;with family/visitor present Nurse Communication: Mobility status         Time: 3202-3343 PT Time Calculation (min) (ACUTE ONLY): 8 min   Charges:   PT Evaluation $PT Eval Low Complexity: 1 Procedure     PT G Codes:        Lindsay Little,Lindsay Little 11/20/2015, 3:33 PM Lindsay Little, PT, DPT 11/20/2015 Pager: [phone]

## 2015-11-20 NOTE — Care Management Note (Signed)
Case Management Note  Patient Details  Name: Lindsay Little MRN: [id number] Date of Birth: Nov 26, 1965  Subjective/Objective:    Status post diverting loop ileostomy, lysis of adhesions, drain placement                Action/Plan: Discharge planning, spoke with patient and spouse at beside. Chose [facility] for [facility] services, contacted [facility] for referral for ostomy care and IVF.  Expected Discharge Date:  11/21/15               Expected Discharge Plan:  [place]  In-House Referral:  NA  Discharge planning Services  CM Consult  Post Acute Care Choice:  Home Health Choice offered to:  Patient  DME Arranged:  N/A DME Agency:  NA  HH Arranged:  RN, Disease Management [place] Agency:  [place]  Status of Service:  Completed, signed off  Medicare Important Message Given:    Date Medicare IM Given:    Medicare IM give by:    Date Additional Medicare IM Given:    Additional Medicare Important Message give by:     If discussed at [place] of Stay Meetings, dates discussed:    Additional Comments:  Guadalupe Maple, RN 11/20/2015, 10:11 AM [phone]

## 2015-11-20 NOTE — Consult Note (Addendum)
WOC ostomy consult note Stoma type/location: RUQ ileostomy Stomal assessment/size: 1 and 5/8 inches, red, most, edematous.  Round with gentle traction applied at 12 o'clock, slightly oval otherwise.  Os in center, pointed toward 12 o'clock Peristomal assessment: intact with erythema and macular rash consistent with fungal overgrowth from 7-10 o'clock Treatment options for stomal/peristomal skin: burshed lightly with OTC antifungal powder and brushed away excess. Output: thin, dark green effluent Ostomy pouching: 1pc.convex ostomy pouching system with skin barrier ring Education provided: Patient has just been medicated for pain, and spouse is assisting in care today.  She has had leakage from pouches over the weekend and leakage from her JP drain in teh LLQ is evident at this time.  After repouching, patient is assisted to bathroom for clean up and for the changing of her patient gown.  The patient prefers to have her spouse assist her in the bathroom to assistance by Nursing Tech.  I will monitor closely to ensure pouching system is successful over the next two days. Educational booklet is left in room as are two spare pouching systems. If you agree, please order a one-time dose of IV antifungal (eg., Diflucan) for fungal overgrowth at ostomy site. Enrolled patient in [place] program: No WOC nursing team will follow, and will remain available to this patient, the nursing, surgical and medical teams.   Thanks, Maudie Flakes, MSN, RN, [place], Arther Abbott  Pager# [phone]

## 2015-11-21 MED ORDER — ACETAMINOPHEN 325 MG PO TABS: 325.0000 mg | ORAL_TABLET | Freq: Four times a day (QID) | ORAL | Status: DC | PRN

## 2015-11-21 MED ORDER — LOPERAMIDE HCL 2 MG PO CAPS
4.0000 mg | ORAL_CAPSULE | Freq: Every day | ORAL | Status: DC
  Administered 2015-11-21 – 2015-11-22 (×2): 4 mg via ORAL
  Filled 2015-11-21 (×2): qty 2

## 2015-11-21 MED ORDER — FERROUS SULFATE 325 (65 FE) MG PO TABS
325.0000 mg | ORAL_TABLET | Freq: Three times a day (TID) | ORAL | Status: DC
  Filled 2015-11-21 (×4): qty 1

## 2015-11-21 MED ORDER — PSYLLIUM 95 % PO PACK
1.0000 | PACK | Freq: Two times a day (BID) | ORAL | Status: DC
  Administered 2015-11-21 – 2015-11-22 (×3): 1 via ORAL
  Filled 2015-11-21 (×4): qty 1

## 2015-11-21 MED ORDER — LOPERAMIDE HCL 2 MG PO CAPS: 2.0000 mg | ORAL_CAPSULE | Freq: Three times a day (TID) | ORAL | Status: DC | PRN

## 2015-11-21 MED ORDER — METOCLOPRAMIDE HCL 5 MG/ML IJ SOLN
5.0000 mg | Freq: Four times a day (QID) | INTRAMUSCULAR | Status: DC | PRN
  Administered 2015-11-22: 10 mg via INTRAVENOUS
  Filled 2015-11-21: qty 2

## 2015-11-21 MED ORDER — SODIUM CHLORIDE 0.9 % IV SOLN
25.0000 mg | Freq: Four times a day (QID) | INTRAVENOUS | Status: DC | PRN
  Filled 2015-11-21: qty 1

## 2015-11-21 MED ORDER — LACTATED RINGERS IV BOLUS (SEPSIS)
2000.0000 mL | Freq: Once | INTRAVENOUS | Status: AC
  Administered 2015-11-21: 2000 mL via INTRAVENOUS

## 2015-11-21 MED ORDER — ENOXAPARIN SODIUM 40 MG/0.4ML ~~LOC~~ SOLN
40.0000 mg | Freq: Two times a day (BID) | SUBCUTANEOUS | Status: DC
  Administered 2015-11-21 – 2015-11-23 (×5): 40 mg via SUBCUTANEOUS
  Filled 2015-11-21 (×7): qty 0.4

## 2015-11-21 MED ORDER — LOPERAMIDE HCL 2 MG PO CAPS
4.0000 mg | ORAL_CAPSULE | Freq: Every day | ORAL | Status: DC
  Administered 2015-11-22 – 2015-11-23 (×2): 4 mg via ORAL
  Filled 2015-11-21 (×2): qty 2

## 2015-11-21 MED ORDER — BISMUTH SUBSALICYLATE 262 MG/15ML PO SUSP
30.0000 mL | ORAL | Status: DC | PRN
  Administered 2015-11-21: 30 mL via ORAL
  Filled 2015-11-21: qty 118

## 2015-11-21 MED ORDER — FLUCONAZOLE IN SODIUM CHLORIDE 400-0.9 MG/200ML-% IV SOLN
400.0000 mg | Freq: Once | INTRAVENOUS | Status: AC
  Administered 2015-11-21: 400 mg via INTRAVENOUS
  Filled 2015-11-21: qty 200

## 2015-11-21 MED ORDER — METHOCARBAMOL 1000 MG/10ML IJ SOLN
1000.0000 mg | Freq: Four times a day (QID) | INTRAVENOUS | Status: DC | PRN
  Filled 2015-11-21: qty 10

## 2015-11-21 MED ORDER — ONDANSETRON 8 MG PO TBDP
8.0000 mg | ORAL_TABLET | Freq: Three times a day (TID) | ORAL | Status: DC
  Administered 2015-11-21 – 2015-11-23 (×7): 8 mg via ORAL
  Filled 2015-11-21 (×5): qty 1
  Filled 2015-11-21: qty 2
  Filled 2015-11-21 (×4): qty 1

## 2015-11-21 NOTE — Plan of Care (Signed)
Problem: Food- and Nutrition-Related Knowledge Deficit (NB-1.1) Goal: Nutrition education Formal process to instruct or train a patient/client in a skill or to impart knowledge to help patients/clients voluntarily manage or modify food choices and eating behavior to maintain or improve health. Outcome: Completed/Met Date Met:  11/21/15 Nutrition Education Note  RD consulted for nutrition education regarding a diet status post ileostomy.  RD provided "Ileostomy Nutrition Therapy" handout from the Academy of Nutrition and Dietetics. Encouraged patient to keep fiber intake below 8 grams during transition from liquids to solids, and below 13 grams per day as symptoms subside. Encouraged patient to consume refined and white grain foods with less than 2g of fiber per serving as well as soft, well-cooked proteins, and well cooked vegetables without seeds or skin.  RD discussed why it is important to adhere to list of recommended foods, and foods to avoid, to maintain ostomy integrity and decrease risk of gas, diarrhea, and blockage related complications. Encouraged patient to consume 8 to 10 cups of liquid per day and to drink liquids 30 minutes after meals or snacks to prevent flushing.   Provided tips to ensure adequate absorption of medications, vitamins, and minerals. As patient returns diet to normal, encouraged adding 1 new food in per day, for a few days to monitor tolerance.  Expect fair compliance.   Body mass index is 40.14 kg/(m^2). Pt meets criteria for obese class III based on current BMI.  Current diet order is SOFT, patient is consuming approximately 100% of meals at this time. Labs and medications reviewed. No further nutrition interventions warranted at this time. RD contact information provided. If additional nutrition issues arise, please re-consult RD.  Satira Anis. Kennedey Digilio, MS, RD LDN After Hours/Weekend Pager [phone]

## 2015-11-21 NOTE — Progress Notes (Signed)
[facility]  [address] [place], [place] [address] Phone: [phone] FAX: [phone]   MAJORIE SANTEE [id number] 12-Mar-1966  Problem List:   Principal Problem:   Large bowel delayed anastomotic leak s/p diverting loop ileostomy 11/17/2015 Active Problems:   Rectal adenocarcinoma s/p LAR resection 05/25/2015   Ileostomy in place [facility])   Morbid obesity with BMI of 50.0-59.9, adult ([place])   Crohn's disease ([place])   Hypertension   IBS (irritable bowel syndrome)   Depression   Pelvic abscess s/p drainage & omental pedicle flap 11/17/2015   Protein-calorie malnutrition, moderate (HCC)   Pelvirectal abscess ([place])   4 Days Post-Op  11/17/2015  POST-OPERATIVE DIAGNOSIS:   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  Irritated acrochordon right lower quadrant panniculus   PROCEDURE:   LAPAROSCOPIC lysis of adhesions 60 minutes DIVERTING LOOP ILEOSTOMY  DRAINAGE OF PELVIC ABSCESS OMENTAL PEDICLE FLAP TO DEEP PELVIS EXAM UNDER ANESTHESIA WITH DISIMPACTION  EXCISION OF SKIN TAG  Surgeon(s): Michael Boston, MD  ASSISTANT: RN    Assessment  Recovering okay so far.  Plan:  -adv diet -antidiarrheal regimen -ostomy care/training -f/u path on abscess/SB serosal Bxs -Need HH setup for home/outpt IVF infusions 2L q MWF x 6 weeks to avoid readmission for ARF/dehydration until intestine adapts & diarrheal slows down -switch to PO pain control -anxiolysis -antidepression -VTE prophylaxis- SCDs, etc -mobilize as tolerated to help recovery  I updated the patient's status to the patient and spouse.  Recommendations were made.  Questions were answered.  They expressed understanding & appreciation.   [name], M.D., F.A.C.S. Gastrointestinal and Minimally Invasive Surgery [facility] [address], [address] [place], [place] [address] [phone] Main /  Paging   11/21/2015  Subjective:  Sore.  IV Dilaudid helps.  Staying in bed.  Afraid to get up side from going to the bathroom.  Was nauseated.  Now having bowel movements and ileostomy.  Less nausea now.  Husband at bedside.  Objective:  Vital signs:  Filed Vitals:   11/20/15 0546 11/20/15 1434 11/20/15 2134 11/21/15 0550  BP: 127/71 135/86 139/80 116/76  Pulse: 80 124 85 69  Temp: 98.2 F (36.8 C) 98.1 F (36.7 C) 98.5 F (36.9 C) 98.4 F (36.9 C)  TempSrc: Oral Oral Oral Oral  Resp: 18 19 18 18   Height:      Weight: 130.6 kg (287 lb 14.7 oz)  130.5 kg (287 lb 11.2 oz)   SpO2: 95% 96% 96% 95%    Last BM Date: 11/19/15  Intake/Output   Yesterday:  05/08 0701 - 05/09 0700 In: 540 [P.O.:540] Out: 2760 [Urine:1250; Drains:40; XHBZJ:6967] This shift:     Bowel function:  Flatus: y  BM: y  Drain: serosanguinous  Physical Exam:  General: Pt awake/alert/oriented x4 in no acute distress Eyes: PERRL, normal EOM.  Sclera clear.  No icterus Neuro: CN II-XII intact w/o focal sensory/motor deficits. Lymph: No head/neck/groin lymphadenopathy Psych:  No delerium/psychosis/paranoia.  Anxious.  Shaking her left foot again. HENT: Normocephalic, Mucus membranes moist.  No thrush Neck: Supple, No tracheal deviation Chest:  No chest wall pain w good excursion CV:  Pulses intact.  Regular rhythm MS: Normal AROM mjr joints.  No obvious deformity Abdomen: Soft.  Nondistended.  Ileostomy pink.  1 cm ridge.  Thin bilious effluent in bag.  .Mildly tender at incisions only.  No evidence of peritonitis.  No incarcerated hernias. Ext:  SCDs BLE.  No mjr edema.  No cyanosis Skin: No petechiae / purpura  Results:   Labs: No results found for this or any previous visit (from the past 48 hour(s)).  Imaging / Studies: No results found.  Medications / Allergies: per chart  Antibiotics: Anti-infectives    Start     Dose/Rate Route Frequency Ordered Stop   11/21/15 0745   fluconazole (DIFLUCAN) IVPB 400 mg     400 mg 100 mL/hr over 120 Minutes Intravenous  Once 11/21/15 0732     11/17/15 1600  clindamycin (CLEOCIN) IVPB 900 mg     900 mg 100 mL/hr over 30 Minutes Intravenous Every 8 hours 11/17/15 1313 11/17/15 1728   11/17/15 1050  clindamycin (CLEOCIN) 900 mg, gentamicin (GARAMYCIN) 240 mg in sodium chloride 0.9 % 1,000 mL for intraperitoneal lavage  Status:  Discontinued       As needed 11/17/15 1050 11/17/15 1108   11/16/15 1430  clindamycin (CLEOCIN) 900 mg, gentamicin (GARAMYCIN) 240 mg in sodium chloride 0.9 % 1,000 mL for intraperitoneal lavage  Status:  Discontinued    Comments:  Pharmacy may adjust dosing strength, schedule, rate of infusion, etc as needed to optimize therapy    Intraperitoneal To Surgery 11/16/15 1417 11/17/15 1259   11/16/15 1416  clindamycin (CLEOCIN) IVPB 900 mg     900 mg 100 mL/hr over 30 Minutes Intravenous 60 min pre-op 11/16/15 1417 11/17/15 0744   11/16/15 1416  gentamicin (GARAMYCIN) 480 mg in dextrose 5 % 100 mL IVPB     480 mg 112 mL/hr over 60 Minutes Intravenous 60 min pre-op 11/16/15 1417 11/17/15 0757        Note: Portions of this report may have been transcribed using voice recognition software. Every effort was made to ensure accuracy; however, inadvertent computerized transcription errors may be present.   Any transcriptional errors that result from this process are unintentional.     [name], M.D., F.A.C.S. Gastrointestinal and Minimally Invasive Surgery [facility] [address] [place], [place] [address] [phone] Main / Paging   11/21/2015  CARE TEAM:  PCP: Vidal Schwalbe, MD  Outpatient Care Team: Patient Care Team: [name], MD as PCP - General (Family Medicine) Michael Boston, MD as Consulting Physician (General Surgery) Truitt Merle, MD as Consulting Physician (Medical Oncology) Kyung Rudd, MD as Consulting Physician (Radiation  Oncology) Teena Irani, MD as Consulting Physician (Gastroenterology)  Inpatient Treatment Team: Treatment Team: Attending Provider: Michael Boston, MD; Technician: Tenna Child, NT; Registered Nurse: Arminda Resides, RN

## 2015-11-22 LAB — CREATININE, SERUM
CREATININE: 0.64 mg/dL (ref 0.44–1.00)
GFR calc Af Amer: >60 mL/min (ref >60)

## 2015-11-22 LAB — MAGNESIUM: MAGNESIUM: 1.9 mg/dL (ref 1.7–2.4)

## 2015-11-22 LAB — POTASSIUM: POTASSIUM: 3 mmol/L — AB (ref 3.5–5.1)

## 2015-11-22 LAB — CBC
HCT: 32.1 % — ABNORMAL LOW (ref 36.0–46.0)
HEMOGLOBIN: 10.3 g/dL — AB (ref 12.0–15.0)
MCH: 27.9 pg (ref 26.0–34.0)
MCHC: 32.1 g/dL (ref 30.0–36.0)
MCV: 87 fL (ref 78.0–100.0)
Platelets: 393 10*3/uL (ref 150–400)
RBC: 3.69 MIL/uL — AB (ref 3.87–5.11)
RDW: 15.1 % (ref 11.5–15.5)
WBC: 9.8 10*3/uL (ref 4.0–10.5)

## 2015-11-22 LAB — PHOSPHORUS: Phosphorus: 3.7 mg/dL (ref 2.5–4.6)

## 2015-11-22 LAB — PREALBUMIN: PREALBUMIN: 11.7 mg/dL — AB (ref 18–38)

## 2015-11-22 MED ORDER — LACTATED RINGERS IV BOLUS (SEPSIS)
2000.0000 mL | INTRAVENOUS | Status: DC
  Administered 2015-11-22 (×2): 1000 mL via INTRAVENOUS

## 2015-11-22 MED ORDER — ALTEPLASE 2 MG IJ SOLR
2.0000 mg | Freq: Once | INTRAMUSCULAR | Status: AC
  Administered 2015-11-22: 2 mg
  Filled 2015-11-22: qty 2

## 2015-11-22 MED ORDER — HYDROMORPHONE HCL 1 MG/ML IJ SOLN
0.5000 mg | INTRAMUSCULAR | Status: DC | PRN
  Administered 2015-11-22: 2 mg via INTRAVENOUS
  Filled 2015-11-22: qty 2

## 2015-11-22 NOTE — Consult Note (Signed)
WOC ostomy follow up Stoma type/location: RUQ 1 and 5/8 inches round.  Os at 12 o'clock. Stomal assessment/size: red, moist Peristomal assessment: intact in the immediate parastomal field, fungal overgrowth resolving in the outer zone. Treatment options for stomal/peristomal skin: Continue OTC antifungal powder as needed Output brown stool, soft, thickeneing Ostomy pouching: 1pc.convex pouching system. With skin barrier ring.  $ pouches that are the 1 and 1/2 inch size  (with rings) provided, 5 pouches that accommodate the size she is today are provided (with rings).  Spouse assists me with pouching materials preparation and in application of pouch.  They are independent in emptying and in cleaning the tail closure of the pouch.  Patient closes the Lock and Roll closure feature. Education provided: Extended session with patient and spouse for pouch change. Enrolled patient in [place] Discharge program: Yes, today. [place] nursing team will follow, and  will remain available to this patient, the nursing, surgical and medical teams.   Thanks, Maudie Flakes, MSN, RN, [place], Arther Abbott  Pager# [phone]

## 2015-11-22 NOTE — Progress Notes (Signed)
[facility]  [address] [place], [place] [address] Phone: [phone] FAX: [phone]   Lindsay Little [id number] 10-04-1965  Problem List:   Principal Problem:   Large bowel delayed anastomotic leak s/p diverting loop ileostomy 11/17/2015 Active Problems:   Rectal adenocarcinoma s/p LAR resection 05/25/2015   Ileostomy in place [facility])   Morbid obesity with BMI of 50.0-59.9, adult ([place])   Crohn's disease ([place])   Hypertension   IBS (irritable bowel syndrome)   Depression   Pelvic abscess s/p drainage & omental pedicle flap 11/17/2015   Protein-calorie malnutrition, moderate (HCC)   Pelvirectal abscess ([place])   5 Days Post-Op  11/17/2015  POST-OPERATIVE DIAGNOSIS:   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  Irritated acrochordon right lower quadrant panniculus   PROCEDURE:   LAPAROSCOPIC lysis of adhesions 60 minutes DIVERTING LOOP ILEOSTOMY  DRAINAGE OF PELVIC ABSCESS OMENTAL PEDICLE FLAP TO DEEP PELVIS EXAM UNDER ANESTHESIA WITH DISIMPACTION  EXCISION OF SKIN TAG  Surgeon(s): Michael Boston, MD  ASSISTANT: RN    Assessment  Recovering okay so far.  Plan:  -adv diet -antidiarrheal regimen -ostomy care/training -f/u path - BENIGN on abscess/SB serosal Bxs -HH setup for home/outpt IVF infusions 2L q MWF x 6 weeks to avoid readmission for ARF/dehydration until intestine adapts & diarrheal slows down -use PO pain control -anxiolysis -antidepression -VTE prophylaxis- SCDs, etc -mobilize as tolerated to help recovery  D/C patient from hospital when patient meets criteria (anticipate in 2 day(s)):  Tolerating oral intake well Ambulating well Adequate pain control without IV medications Urinating  Having flatus Ostomy care in place [facility] for ostomy care & MWF 2L IVF boluses set up Disposition planning in place   I updated the patient's status to the patient, nurse, and family.   Recommendations were made.  Questions were answered.  They expressed understanding & appreciation.   [name], M.D., F.A.C.S. Gastrointestinal and Minimally Invasive Surgery [facility] [address], [place], [place] [address] [phone] Main / Paging   11/22/2015  Subjective:  Less sore.  Walking more BAthing in bed - wanting to shower Nausea less Husband at bedside.  Objective:  Vital signs:  Filed Vitals:   11/21/15 0550 11/21/15 1454 11/21/15 2228 11/22/15 0557  BP: 116/76 168/83 126/61 117/54  Pulse: 69 115 91 89  Temp: 98.4 F (36.9 C) 99 F (37.2 C) 99.9 F (37.7 C) 99.9 F (37.7 C)  TempSrc: Oral Oral Oral Oral  Resp: 18 18 18 18   Height:      Weight:   133.5 kg (294 lb 5 oz)   SpO2: 95% 95% 94% 96%    Last BM Date: 11/21/15  Intake/Output   Yesterday:  05/09 0701 - 05/10 0700 In: 752 [P.O.:540; IV Piggyback:212] Out: 2020 [DVVOH:6073; Drains:45; Stool:300] This shift:     Bowel function:  Flatus: y  BM: y  Drain: thinly feculent  Physical Exam:  General: Pt awake/alert/oriented x4 in no acute distress Eyes: PERRL, normal EOM.  Sclera clear.  No icterus Neuro: CN II-XII intact w/o focal sensory/motor deficits. Lymph: No head/neck/groin lymphadenopathy Psych:  No delerium/psychosis/paranoia.  Anxious.  Shaking her left foot less. HENT: Normocephalic, Mucus membranes moist.  No thrush Neck: Supple, No tracheal deviation Chest:  No chest wall pain w good excursion CV:  Pulses intact.  Regular rhythm MS: Normal AROM mjr joints.  No obvious deformity Abdomen: Soft.  Nondistended.  Ileostomy pink.  1 cm ridge.  Thicker bilious effluent in bag.  .Mildly tender at incisions only.  No evidence of peritonitis.  No incarcerated hernias. Ext:  SCDs BLE.  No mjr edema.  No cyanosis Skin: No petechiae / purpura  Results:   Labs: No results found for this or any previous visit (from the past 48  hour(s)).  Imaging / Studies: No results found.  Medications / Allergies: per chart  Antibiotics: Anti-infectives    Start     Dose/Rate Route Frequency Ordered Stop   11/21/15 0900  fluconazole (DIFLUCAN) IVPB 400 mg     400 mg 100 mL/hr over 120 Minutes Intravenous  Once 11/21/15 0732 11/21/15 1055   11/17/15 1600  clindamycin (CLEOCIN) IVPB 900 mg     900 mg 100 mL/hr over 30 Minutes Intravenous Every 8 hours 11/17/15 1313 11/17/15 1728   11/17/15 1050  clindamycin (CLEOCIN) 900 mg, gentamicin (GARAMYCIN) 240 mg in sodium chloride 0.9 % 1,000 mL for intraperitoneal lavage  Status:  Discontinued       As needed 11/17/15 1050 11/17/15 1108   11/16/15 1430  clindamycin (CLEOCIN) 900 mg, gentamicin (GARAMYCIN) 240 mg in sodium chloride 0.9 % 1,000 mL for intraperitoneal lavage  Status:  Discontinued    Comments:  Pharmacy may adjust dosing strength, schedule, rate of infusion, etc as needed to optimize therapy    Intraperitoneal To Surgery 11/16/15 1417 11/17/15 1259   11/16/15 1416  clindamycin (CLEOCIN) IVPB 900 mg     900 mg 100 mL/hr over 30 Minutes Intravenous 60 min pre-op 11/16/15 1417 11/17/15 0744   11/16/15 1416  gentamicin (GARAMYCIN) 480 mg in dextrose 5 % 100 mL IVPB     480 mg 112 mL/hr over 60 Minutes Intravenous 60 min pre-op 11/16/15 1417 11/17/15 0757        Note: Portions of this report may have been transcribed using voice recognition software. Every effort was made to ensure accuracy; however, inadvertent computerized transcription errors may be present.   Any transcriptional errors that result from this process are unintentional.     [name], M.D., F.A.C.S. Gastrointestinal and Minimally Invasive Surgery [facility] [address], [place], [place] [address] [phone] Main / Paging   11/22/2015  CARE TEAM:  PCP: Vidal Schwalbe, MD  Outpatient Care Team: Patient Care Team: [name], MD as PCP -  General (Family Medicine) Michael Boston, MD as Consulting Physician (General Surgery) Truitt Merle, MD as Consulting Physician (Medical Oncology) Kyung Rudd, MD as Consulting Physician (Radiation Oncology) Teena Irani, MD as Consulting Physician (Gastroenterology)  Inpatient Treatment Team: Treatment Team: Attending Provider: Michael Boston, MD; Technician: Tenna Child, NT; Registered Nurse: Arminda Resides, RN; Technician: Leda Quail, NT

## 2015-11-23 DIAGNOSIS — E876 Hypokalemia: Secondary | ICD-10-CM

## 2015-11-23 MED ORDER — HEPARIN SOD (PORK) LOCK FLUSH 100 UNIT/ML IV SOLN: 500.0000 [IU] | INTRAVENOUS | Status: DC

## 2015-11-23 MED ORDER — ONDANSETRON HCL 8 MG PO TABS: 8.0000 mg | ORAL_TABLET | Freq: Two times a day (BID) | ORAL | Status: DC

## 2015-11-23 MED ORDER — HEPARIN SOD (PORK) LOCK FLUSH 100 UNIT/ML IV SOLN
500.0000 [IU] | INTRAVENOUS | Status: DC | PRN
  Administered 2015-11-23: 500 [IU]

## 2015-11-23 MED ORDER — POTASSIUM CHLORIDE CRYS ER 20 MEQ PO TBCR
40.0000 meq | EXTENDED_RELEASE_TABLET | Freq: Two times a day (BID) | ORAL | Status: DC
  Administered 2015-11-23: 40 meq via ORAL
  Filled 2015-11-23: qty 2

## 2015-11-23 NOTE — Anesthesia Postprocedure Evaluation (Signed)
Anesthesia Post Note  Patient: GRACEMARIE SKEET  Procedure(s) Performed: Procedure(s) (LRB): LAPAROSCOPIC DIVERTING LOOP ILEOSTOMY  DRAINAGE OF PELVIC ABSCESS (N/A) DISIMPACTION REMOVAL EXAM UNDER ANESTHESIA EXCISION OF SKIN TAG LAPAROSCOPIC LYSIS OF ADHESIONS  Patient location during evaluation: PACU Anesthesia Type: General Level of consciousness: awake Pain management: pain level controlled Vital Signs Assessment: post-procedure vital signs reviewed and stable Respiratory status: spontaneous breathing Cardiovascular status: stable Postop Assessment: no signs of nausea or vomiting Anesthetic complications: no    Last Vitals:  Filed Vitals:   11/22/15 2130 11/23/15 0554  BP: 125/50 125/63  Pulse: 91 78  Temp: 37.3 C 36.6 C  Resp: 18 18    Last Pain:  Filed Vitals:   11/23/15 0943  PainSc: 8                  Shynice Sigel

## 2015-11-23 NOTE — Consult Note (Addendum)
WOC ostomy follow up Stoma type/location:RUQ ileostomy     Stomal assessment/size: Stoma red and moist. Appliance intact Peristomal assessment: Appliance in place. Has resolving fungal growth as per WOC note for 11/22/15 Treatment options for stomal/peristomal skin: Will continue previous treatment as per WOC note for 11/22/15 Output : Soft thick stool Ostomy pouching: 1pc.convex  Education provided:  Patient and spouse expressed understanding of emptying and changing pouch. Has no questions at this time and are looking forward to been discharge in AM. Reinforced need to remain hydrated Enrolled patient in [place] [facility] Discharge program: Yes [place] nursing team will continue to follow and will remain available to nursing, surgical and medical teams.   Thanks, Melba Coon MSN, RN, [employer]

## 2015-11-23 NOTE — Progress Notes (Deleted)
[facility]  [address] [place], [place] [address] Phone: [phone] FAX: [phone]   MARIBEL HADLEY [id number] September 05, 1965  Problem List:   Principal Problem:   Large bowel delayed anastomotic leak s/p diverting loop ileostomy 11/17/2015 Active Problems:   Rectal adenocarcinoma s/p LAR resection 05/25/2015   Ileostomy in place [facility])   Morbid obesity with BMI of 50.0-59.9, adult ([place])   Crohn's disease ([place])   Hypertension   IBS (irritable bowel syndrome)   Depression   Pelvic abscess s/p drainage & omental pedicle flap 11/17/2015   Protein-calorie malnutrition, moderate ([facility])   Pelvirectal abscess ([place])   6 Days Post-Op  11/17/2015  POST-OPERATIVE DIAGNOSIS:   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  Irritated acrochordon right lower quadrant panniculus   PROCEDURE:   LAPAROSCOPIC lysis of adhesions 60 minutes DIVERTING LOOP ILEOSTOMY  DRAINAGE OF PELVIC ABSCESS OMENTAL PEDICLE FLAP TO DEEP PELVIS EXAM UNDER ANESTHESIA WITH DISIMPACTION  EXCISION OF SKIN TAG  Surgeon(s): Michael Boston, MD  ASSISTANT: RN    Assessment  Recovering okay so far.  Plan:  -adv diet -antidiarrheal regimen -ostomy care/training -f/u path - BENIGN on abscess/SB serosal Bxs -HH setup for home/outpt IVF infusions 2L q MWF x 6 weeks to avoid readmission for ARF/dehydration until intestine adapts & diarrheal slows down -use PO pain control -anxiolysis -antidepression -VTE prophylaxis- SCDs, etc -mobilize as tolerated to help recovery  D/C patient from hospital when patient meets criteria (anticipate in 2 day(s)):  Tolerating oral intake well Ambulating well Adequate pain control without IV medications Urinating  Having flatus Ostomy care in place [facility] for ostomy care & MWF 2L IVF boluses set up Disposition planning in place   I updated the patient's status to the patient, nurse, and family.   Recommendations were made.  Questions were answered.  They expressed understanding & appreciation.   [name], M.D., F.A.C.S. Gastrointestinal and Minimally Invasive Surgery [facility] [address] [place],  [address] [phone] Main / Paging   11/23/2015  Subjective:  Less sore.  Walking more BAthing in bed - wanting to shower Nausea less Husband at bedside.  Objective:  Vital signs:  Filed Vitals:   11/22/15 0557 11/22/15 1400 11/22/15 2130 11/23/15 0554  BP: 117/54 148/53 125/50 125/63  Pulse: 89 109 91 78  Temp: 99.9 F (37.7 C) 98.1 F (36.7 C) 99.1 F (37.3 C) 97.9 F (36.6 C)  TempSrc: Oral Oral Oral Oral  Resp: _0 Height:      Weight:   134.5 kg (296 lb 8.3 oz)   SpO2: 96% 100% 98% 96%    Last BM Date: 11/22/15  Intake/Output   Yesterday:  05/10 0701 - 05/11 0700 In: 20 [I.V.:20] Out: 1210 [Urine:1150; Drains:10; Stool:50] This shift:     Bowel function:  Flatus: y  BM: y  Drain: thinly feculent  Physical Exam:  General: Pt awake/alert/oriented x4 in no acute distress Eyes: PERRL, normal EOM.  Sclera clear.  No icterus Neuro: CN II-XII intact w/o focal sensory/motor deficits. Lymph: No head/neck/groin lymphadenopathy Psych:  No delerium/psychosis/paranoia.  Anxious.  Shaking her left foot less. HENT: Normocephalic, Mucus membranes moist.  No thrush Neck: Supple, No tracheal deviation Chest:  No chest wall pain w good excursion CV:  Pulses intact.  Regular rhythm MS: Normal AROM mjr joints.  No obvious deformity Abdomen: Soft.  Nondistended.  Ileostomy pink.  1 cm ridge.  Thicker bilious  effluent in bag.  .Mildly tender at incisions only.  No evidence of peritonitis.  No incarcerated hernias. Ext:  SCDs BLE.  No mjr edema.  No cyanosis Skin: No petechiae / purpura  Results:   Labs: Results for orders placed or performed during the hospital encounter of 11/17/15 (from the  past 48 hour(s))  Prealbumin     Status: Abnormal   Collection Time: 11/22/15  8:12 AM  Result Value Ref Range   Prealbumin 11.7 (L) 18 - 38 mg/dL    Comment: Performed at [facility]  CBC     Status: Abnormal   Collection Time: 11/22/15  8:12 AM  Result Value Ref Range   WBC 9.8 4.0 - 10.5 K/uL   RBC 3.69 (L) 3.87 - 5.11 MIL/uL   Hemoglobin 10.3 (L) 12.0 - 15.0 g/dL   HCT 32.1 (L) 36.0 - 46.0 %   MCV 87.0 78.0 - 100.0 fL   MCH 27.9 26.0 - 34.0 pg   MCHC 32.1 30.0 - 36.0 g/dL   RDW 15.1 11.5 - 15.5 %   Platelets 393 150 - 400 K/uL  Creatinine, serum     Status: None   Collection Time: 11/22/15  8:12 AM  Result Value Ref Range   Creatinine, Ser 0.64 0.44 - 1.00 mg/dL   GFR calc non Af Amer >60 >60 mL/min   GFR calc Af Amer >60 >60 mL/min    Comment: (NOTE) The eGFR has been calculated using the CKD EPI equation. This calculation has not been validated in all clinical situations. eGFR's persistently <60 mL/min signify possible Chronic Kidney Disease.   Potassium     Status: Abnormal   Collection Time: 11/22/15  8:12 AM  Result Value Ref Range   Potassium 3.0 (L) 3.5 - 5.1 mmol/L  Magnesium     Status: None   Collection Time: 11/22/15  8:12 AM  Result Value Ref Range   Magnesium 1.9 1.7 - 2.4 mg/dL  Phosphorus     Status: None   Collection Time: 11/22/15  8:12 AM  Result Value Ref Range   Phosphorus 3.7 2.5 - 4.6 mg/dL    Imaging / Studies: No results found.  Medications / Allergies: per chart  Antibiotics: Anti-infectives    Start     Dose/Rate Route Frequency Ordered Stop   11/21/15 0900  fluconazole (DIFLUCAN) IVPB 400 mg     400 mg 100 mL/hr over 120 Minutes Intravenous  Once 11/21/15 0732 11/21/15 1055   11/17/15 1600  clindamycin (CLEOCIN) IVPB 900 mg     900 mg 100 mL/hr over 30 Minutes Intravenous Every 8 hours 11/17/15 1313 11/17/15 1728   11/17/15 1050  clindamycin (CLEOCIN) 900 mg, gentamicin (GARAMYCIN) 240 mg in sodium chloride 0.9 % 1,000  mL for intraperitoneal lavage  Status:  Discontinued       As needed 11/17/15 1050 11/17/15 1108   11/16/15 1430  clindamycin (CLEOCIN) 900 mg, gentamicin (GARAMYCIN) 240 mg in sodium chloride 0.9 % 1,000 mL for intraperitoneal lavage  Status:  Discontinued    Comments:  Pharmacy may adjust dosing strength, schedule, rate of infusion, etc as needed to optimize therapy    Intraperitoneal To Surgery 11/16/15 1417 11/17/15 1259   11/16/15 1416  clindamycin (CLEOCIN) IVPB 900 mg     900 mg 100 mL/hr over 30 Minutes Intravenous 60 min pre-op 11/16/15 1417 11/17/15 0744   11/16/15 1416  gentamicin (GARAMYCIN) 480 mg in dextrose 5 % 100 mL IVPB     480  mg 112 mL/hr over 60 Minutes Intravenous 60 min pre-op 11/16/15 1417 11/17/15 0757        Note: Portions of this report may have been transcribed using voice recognition software. Every effort was made to ensure accuracy; however, inadvertent computerized transcription errors may be present.   Any transcriptional errors that result from this process are unintentional.     [name], M.D., F.A.C.S. Gastrointestinal and Minimally Invasive Surgery [facility] [address] [place], [place] [address] [phone] Main / Paging   11/23/2015  CARE TEAM:  PCP: Vidal Schwalbe, MD  Outpatient Care Team: Patient Care Team: [name], MD as PCP - General (Family Medicine) Michael Boston, MD as Consulting Physician (General Surgery) Truitt Merle, MD as Consulting Physician (Medical Oncology) Kyung Rudd, MD as Consulting Physician (Radiation Oncology) Teena Irani, MD as Consulting Physician (Gastroenterology)  Inpatient Treatment Team: Treatment Team: Attending Provider: Michael Boston, MD; Technician: Tenna Child, NT; Technician: Leda Quail, NT; Registered Nurse: Cindy Hazy, RN

## 2015-11-23 NOTE — Progress Notes (Signed)
Spoke with patient at bedside, [facility] services arranged, [place] notified of d/c today.

## 2015-11-23 NOTE — Progress Notes (Signed)
Discharge instructions discussed with patient and husband, verbalized agreement and understanding.  Prescription given to patient

## 2015-11-23 NOTE — Discharge Summary (Signed)
Physician Discharge Summary  [name] MRN: [id number] DOB/AGE: 1966-03-06 50 y.o.  Admit date: 11/17/2015 Discharge date: 11/23/2015  Patient Care Team: [name], MD as PCP - General (Family Medicine) Michael Boston, MD as Consulting Physician (General Surgery) Truitt Merle, MD as Consulting Physician ([place]) Kyung Rudd, MD as Consulting Physician (Radiation Oncology) Teena Irani, MD as Consulting Physician (Gastroenterology)  Admission Diagnoses: Principal Problem:   Large bowel delayed anastomotic leak s/p diverting loop ileostomy 11/17/2015 Active Problems:   Rectal adenocarcinoma s/p LAR resection 05/25/2015   Ileostomy in place [facility])   Morbid obesity with BMI of 50.0-59.9, adult ([place])   Crohn's disease ([place])   Hypertension   IBS (irritable bowel syndrome)   Depression   Pelvic abscess s/p drainage & omental pedicle flap 11/17/2015   Protein-calorie malnutrition, moderate ([place])   Pelvirectal abscess ([place])   Hypokalemia   Discharge Diagnoses:  Principal Problem:   Large bowel delayed anastomotic leak s/p diverting loop ileostomy 11/17/2015 Active Problems:   Rectal adenocarcinoma s/p LAR resection 05/25/2015   Ileostomy in place [facility])   Morbid obesity with BMI of 50.0-59.9, adult ([place])   Crohn's disease ([place])   Hypertension   IBS (irritable bowel syndrome)   Depression   Pelvic abscess s/p drainage & omental pedicle flap 11/17/2015   Protein-calorie malnutrition, moderate (H[facility])   Pelvirectal abscess ([facility])   Hypokalemia   POST-OPERATIVE DIAGNOSIS:   Fistula at colorectal anastomosis  SURGERY:  11/17/2015  Procedure(s): LAPAROSCOPIC DIVERTING LOOP ILEOSTOMY  DRAINAGE OF PELVIC ABSCESS DISIMPACTION REMOVAL EXAM UNDER ANESTHESIA EXCISION OF SKIN TAG LAPAROSCOPIC LYSIS OF ADHESIONS  SURGEON:    Surgeon(s): Michael Boston, MD  Consults: ostomy RN, PT, OT  Hospital Course:   Pleasant obese female with bulky rectosigmoid tumor status  post neoadjuvant chemoradiation therapy and resection.  Downstage.  Developed delayed hematoma.  That abscess.  Then delayed leak.  Initially controlled drain.  Worsened.  Recurred.  I recommended fecal diversion with loop ileostomy.  The patient underwent the surgery above.  Postoperatively, the patient gradually mobilized and advanced to a solid diet.  Pain and other symptoms were treated aggressively.  Placed on antidiarrheals to avoid dehydration from her ileostomy.  Output controlled.  Wean off IV fluids.  Underwent ostomy training.  Drain care.  Husband and staff area involved.  Pathology on biopsies of small bowel adherent to abscess cavity as well as the abscess cavity itself are benign.  No evidence of cancer recurrence.  Copy pathology given to patient  By the time of discharge, the patient was walking well the hallways, eating food, having flatus.  Pain was well-controlled on an oral medications.  Based on meeting discharge criteria and continuing to recover, I felt it was safe for the patient to be discharged from the hospital to further recover with close followup.  She will get home health ostomy training.  The comfortable with managing the drain.  She will get home health infusions.  2 L crystalloid Monday Wednesday Friday to avoid dehydration.  Do this through her Port-A-Cath which they feel comfortable managing at home.  Postoperative recommendations were discussed in detail to the patient, her husband, and nursing staff.  They are written as well.   Significant Diagnostic Studies:  Results for orders placed or performed during the hospital encounter of 11/17/15 (from the past 72 hour(s))  Prealbumin     Status: Abnormal   Collection Time: 11/22/15  8:12 AM  Result Value Ref Range   Prealbumin 11.7 (L)  18 - 38 mg/dL    Comment: Performed at [facility]  CBC     Status: Abnormal   Collection Time: 11/22/15  8:12 AM  Result Value Ref Range   WBC 9.8 4.0 - 10.5 K/uL   RBC 3.69  (L) 3.87 - 5.11 MIL/uL   Hemoglobin 10.3 (L) 12.0 - 15.0 g/dL   HCT 32.1 (L) 36.0 - 46.0 %   MCV 87.0 78.0 - 100.0 fL   MCH 27.9 26.0 - 34.0 pg   MCHC 32.1 30.0 - 36.0 g/dL   RDW 15.1 11.5 - 15.5 %   Platelets 393 150 - 400 K/uL  Creatinine, serum     Status: None   Collection Time: 11/22/15  8:12 AM  Result Value Ref Range   Creatinine, Ser 0.64 0.44 - 1.00 mg/dL   GFR calc non Af Amer >60 >60 mL/min   GFR calc Af Amer >60 >60 mL/min    Comment: (NOTE) The eGFR has been calculated using the CKD EPI equation. This calculation has not been validated in all clinical situations. eGFR's persistently <60 mL/min signify possible Chronic Kidney Disease.   Potassium     Status: Abnormal   Collection Time: 11/22/15  8:12 AM  Result Value Ref Range   Potassium 3.0 (L) 3.5 - 5.1 mmol/L  Magnesium     Status: None   Collection Time: 11/22/15  8:12 AM  Result Value Ref Range   Magnesium 1.9 1.7 - 2.4 mg/dL  Phosphorus     Status: None   Collection Time: 11/22/15  8:12 AM  Result Value Ref Range   Phosphorus 3.7 2.5 - 4.6 mg/dL    No results found.  Discharge Exam: Blood pressure 125/63, pulse 78, temperature 97.9 F (36.6 C), temperature source Oral, resp. rate 18, height 5' 11"  (1.803 m), weight 134.5 kg (296 lb 8.3 oz), SpO2 96 %.  General: Pt awake/alert/oriented x4 in no major acute distress Eyes: PERRL, normal EOM. Sclera nonicteric Neuro: CN II-XII intact w/o focal sensory/motor deficits. Lymph: No head/neck/groin lymphadenopathy Psych:  No delerium/psychosis/paranoia.  Twitching her left foot again but better.  Much less anxious.  Smiling. HENT: Normocephalic, Mucus membranes moist.  No thrush Neck: Supple, No tracheal deviation Chest: No pain.  Good respiratory excursion. CV:  Pulses intact.  Regular rhythm MS: Normal AROM mjr joints.  No obvious deformity Abdomen: Soft, Nondistended.  Obese but soft.  Ileostomy with 1 cm rosebud ridge.  No more rash.  Sealing.  No leak.   Port sites healing well.  Drain with thinly feculent output but lower and thinner.  Nontender.  No incarcerated hernias. Ext:  SCDs BLE.  No significant edema.  No cyanosis Skin: No petechiae / purpura  Discharged Condition: good   Past Medical History  Diagnosis Date  . Hypertension   . Hypercholesteremia   . Obesity   . Tear of left rotator cuff   . IBS (irritable bowel syndrome)   . Vertigo   . TMJ (dislocation of temporomandibular joint)   . Bulging disc     L5  . Depression   . Rectal bleeding   . Vitamin D deficiency   . Osteoarthritis, knee   . S/P radiation therapy 02/20/15-03/30/15    colon/rectal  . Complication of anesthesia   . PONV (postoperative nausea and vomiting)   . History of mitral valve prolapse   . Mild sleep apnea     does not use CPAP  . History of chemotherapy   . Numbness  and tingling     great toe bilat left more than right   . History of frequent ear infections   . History of frequent urinary tract infections     middle school age  . GERD (gastroesophageal reflux disease)   . Cancer ([place])   . Anemia   . History of blood transfusion   . Fibroids   . 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 Heart murmur   . Occasional tremors   . Wears glasses   . Anxiety   . History of hiatal hernia     Past Surgical History  Procedure Laterality Date  . Rotator cuff repair    . Eus N/A 01/18/2015    Procedure: LOWER ENDOSCOPIC ULTRASOUND (EUS);  Surgeon: Arta Silence, MD;  Location: [facility];  Service: Endoscopy;  Laterality: N/A;  . Abdominal hysterectomy  1996    Removed uterus, and tubes  . Left knee arthroscopy      mult  . Tonsillectomy    . Dilation and curettage of uterus      times 2  . Xi robotic assisted lower anterior resection N/A 05/25/2015    Procedure: XI ROBOTIC ASSISTED LOWER ANTERIOR RESECTION, , RIGID PROCTOSCOPY, RIGHT OOPHORECTOMY;  Surgeon: Michael Boston, MD;  Location: [facility];  Service: General;   Laterality: N/A;  . Portacath placement N/A 05/25/2015    Procedure: INSERTION PORT-A-CATH;  Surgeon: Michael Boston, MD;  Location: [facility];  Service: General;  Laterality: N/A;  . Jp drain     . Ileo loop colostomy closure N/A 11/17/2015    Procedure: LAPAROSCOPIC DIVERTING LOOP ILEOSTOMY  DRAINAGE OF PELVIC ABSCESS;  Surgeon: Michael Boston, MD;  Location: [facility];  Service: General;  Laterality: N/A;  . Impaction removal  11/17/2015    Procedure: DISIMPACTION REMOVAL;  Surgeon: Michael Boston, MD;  Location: [facility];  Service: General;;  . Excision of skin tag  11/17/2015    Procedure: EXCISION OF SKIN TAG;  Surgeon: Michael Boston, MD;  Location: [facility];  Service: General;;  . Laparoscopic lysis of adhesions  11/17/2015    Procedure: LAPAROSCOPIC LYSIS OF ADHESIONS;  Surgeon: Michael Boston, MD;  Location: [facility];  Service: General;;    Social History   Social History  . Marital Status: Married    Spouse Name: N/A  . Number of Children: N/A  . Years of Education: N/A   Occupational History  . Not on file.   Social History Main Topics  . Smoking status: Former Smoker -- 0.50 packs/day for 7 years    Types: Cigarettes    Quit date: 07/16/1991  . Smokeless tobacco: Never Used     Comment: 1 pack/week intermittently x 7 years  . Alcohol Use: 0.6 oz/week    1 Shots of liquor per week     Comment: rarely   . Drug Use: No  . Sexual Activity: Not on file   Other Topics Concern  . Not on file   Social History Narrative   Tobacco Use: Cigarettes - Former Smoker   Alcohol: Yes, very rare, liquor.    No recreational drug use   Occupation: Head CMA @ [employer]   Marital Status: Married    Husband: Roselyn Reef Disabled   Children: 2 adopted kids [place]   Religion: [employer] [place]                   Family History  Problem Relation Age of Onset  .  Coronary artery disease Mother 20  . Hypertension Mother   . Hyperlipidemia Mother   . Diabetes Mellitus I Mother   .  Coronary artery disease Father   . Hyperlipidemia Father   . Hypertension Father   . Cancer Sister     skin - non melanoma  . Hyperlipidemia Brother   . Cancer Maternal Uncle 48    pancreatic with mets to colon and prostate  . Cirrhosis Maternal Uncle   . Hypertension Maternal Grandmother   . Diabetes Mellitus I Maternal Grandmother   . Hyperlipidemia Maternal Grandmother   . CVA Maternal Grandmother   . Hypertension Maternal Grandfather   . Coronary artery disease Maternal Grandfather 65  . Hyperlipidemia Maternal Grandfather   . Coronary artery disease Paternal Grandmother   . Hypertension Paternal Grandmother   . Hyperlipidemia Paternal Grandmother   . Diabetes Mellitus I Paternal Grandmother   . Hypertension Paternal Grandfather   . Hyperlipidemia Paternal Grandfather   . Coronary artery disease Paternal Grandfather     Current Facility-Administered Medications  Medication Dose Route Frequency Provider Last Rate Last Dose  . acetaminophen (TYLENOL) tablet 325-650 mg  325-650 mg Oral Q6H PRN Michael Boston, MD      . alum & mag hydroxide-simeth (MAALOX/MYLANTA) 200-200-20 MG/5ML suspension 30 mL  30 mL Oral Q6H PRN Michael Boston, MD   30 mL at 11/18/15 1747  . bismuth subsalicylate (PEPTO BISMOL) 262 MG/15ML suspension 30 mL  30 mL Oral Q4H PRN Michael Boston, MD   30 mL at 11/21/15 0756  . chlorproMAZINE (THORAZINE) 25 mg in sodium chloride 0.9 % 25 mL IVPB  25 mg Intravenous Q6H PRN Michael Boston, MD      . diphenhydrAMINE (BENADRYL) 12.5 MG/5ML elixir 12.5 mg  12.5 mg Oral Q6H PRN Michael Boston, MD       Or  . diphenhydrAMINE (BENADRYL) injection 12.5 mg  12.5 mg Intravenous Q6H PRN Michael Boston, MD      . enoxaparin (LOVENOX) injection 40 mg  40 mg Subcutaneous Q12H Michael Boston, MD   40 mg at 11/23/15 0900  . hydrALAZINE (APRESOLINE) injection 5-20 mg  5-20 mg Intravenous Q6H PRN Michael Boston, MD      . HYDROmorphone (DILAUDID) injection 0.5-2 mg  0.5-2 mg Intravenous Q2H PRN  Michael Boston, MD   2 mg at 11/22/15 1248  . HYDROmorphone (DILAUDID) tablet 4-8 mg  4-8 mg Oral Q3H PRN Michael Boston, MD   8 mg at 11/23/15 0915  . lactated ringers bolus 2,000 mL  2,000 mL Intravenous Q M,W,F Michael Boston, MD   1,000 mL at 11/22/15 1621  . lidocaine-prilocaine (EMLA) cream   Topical Q6H PRN Armandina Gemma, MD      . lip balm (CARMEX) ointment 1 application  1 application Topical BID Michael Boston, MD   1 application at 32/12/24 1000  . loperamide (IMODIUM) capsule 2-4 mg  2-4 mg Oral Q8H PRN Michael Boston, MD      . loperamide (IMODIUM) capsule 4 mg  4 mg Oral Q breakfast Michael Boston, MD   4 mg at 11/23/15 0914  . loperamide (IMODIUM) capsule 4 mg  4 mg Oral QHS Michael Boston, MD   4 mg at 11/22/15 2216  . LORazepam (ATIVAN) injection 0.5-1 mg  0.5-1 mg Intravenous Q8H PRN Michael Boston, MD      . magic mouthwash  15 mL Oral QID PRN Michael Boston, MD      . menthol-cetylpyridinium (CEPACOL) lozenge 3 mg  1 lozenge  Oral PRN Michael Boston, MD      . methocarbamol (ROBAXIN) 1,000 mg in dextrose 5 % 50 mL IVPB  1,000 mg Intravenous Q6H PRN Michael Boston, MD      . metoCLOPramide (REGLAN) injection 5-10 mg  5-10 mg Intravenous Q6H PRN Michael Boston, MD   10 mg at 11/22/15 0610  . metoprolol (LOPRESSOR) injection 5 mg  5 mg Intravenous Q6H PRN Michael Boston, MD      . ondansetron [facility]) 6 mg in sodium chloride 0.9 % 50 mL IVPB  6 mg Intravenous Q4H PRN Armandina Gemma, MD   6 mg at 11/21/15 0810  . ondansetron (ZOFRAN-ODT) disintegrating tablet 8 mg  8 mg Oral TID Michael Boston, MD   8 mg at 11/23/15 0915  . phenol (CHLORASEPTIC) mouth spray 2 spray  2 spray Mouth/Throat PRN Michael Boston, MD      . potassium chloride SA (K-DUR,KLOR-CON) CR tablet 40 mEq  40 mEq Oral BID Michael Boston, MD   40 mEq at 11/23/15 0914  . prochlorperazine (COMPAZINE) injection 10 mg  10 mg Intravenous Q6H PRN Michael Boston, MD   10 mg at 11/22/15 1915  . psyllium (HYDROCIL/METAMUCIL) packet 1 packet  1 packet Oral BID  Michael Boston, MD   1 packet at 11/22/15 2213  . saccharomyces boulardii (FLORASTOR) capsule 250 mg  250 mg Oral BID Michael Boston, MD   250 mg at 11/23/15 0915  . sodium chloride flush (NS) 0.9 % injection 10-40 mL  10-40 mL Intracatheter PRN Michael Boston, MD   10 mL at 11/22/15 1931  . vitamin C (ASCORBIC ACID) tablet 500 mg  500 mg Oral BID Michael Boston, MD   500 mg at 11/23/15 0915     Allergies  Allergen Reactions  . Caine-1 [Lidocaine] Rash    Eyes swell shut; includes all caine drugs except marcaine. EMLA cream OK though (?!)  . Adhesive [Tape] Other (See Comments)    Blisters - can use paper tape  . Iron Nausea Only  . Oxycodone     NIGHTMARES. (tolerates hydrocodone or tramadol better)  . Penicillins Rash    Has patient had a PCN reaction causing immediate rash, facial/tongue/throat swelling, SOB or lightheadedness with hypotension: no Has patient had a PCN reaction causing severe rash involving mucus membranes or skin necrosis: no Has patient had a PCN reaction that required hospitalization no Has patient had a PCN reaction occurring within the last 10 years: no If all of the above answers are "NO", then may proceed with Cephalosporin use.   . Sulfa Antibiotics Rash    Rash & Vomiting    Disposition: 01-Home or Self Care  Discharge Instructions    Call MD for:  extreme fatigue    Complete by:  As directed      Call MD for:  hives    Complete by:  As directed      Call MD for:  persistant nausea and vomiting    Complete by:  As directed      Call MD for:  redness, tenderness, or signs of infection (pain, swelling, redness, odor or green/yellow discharge around incision site)    Complete by:  As directed      Call MD for:  severe uncontrolled pain    Complete by:  As directed      Call MD for:    Complete by:  As directed   Temperature > 101.74F     Diet - low sodium heart healthy  Complete by:  As directed   Start with bland, low residue diet for a few days, then  advance to a heart healthy (low fat, high fiber) diet.  If you feel nauseated or constipated, simplify to a liquid only diet for 48 hours until you are feeling better (no more nausea, farting/passing gas, having a bowel movement, etc...).  If you cannot tolerate even drinking liquids, or feeling worse, let your surgeon know or go to the Emergency Department for help.     Discharge instructions    Complete by:  As directed   Please see discharge instruction sheets.   Also refer to any handouts/printouts that may have been given from the [facility] surgery office (if you visited [place] there before surgery) Please call our office if you have any questions or concerns [phone]     Discharge wound care:    Complete by:  As directed   If you have closed incisions: Shower and bathe over these incisions with soap and water every day.  It is OK to wash over the dressings: they are waterproof. Remove all surgical dressings on postoperative day #3.  You do not need to replace dressings over the closed incisions unless you feel more comfortable with a Band-Aid covering it.   If you have an open wound: That requires packing, so please see wound care instructions.   In general, remove all dressings, wash wound with soap and water and then replace with saline moistened gauze.  Do the dressing change at least every day.    Please call our office [phone] if you have further questions.     Driving Restrictions    Complete by:  As directed   No driving until off narcotics and can safely swerve away without pain during an emergency     Increase activity slowly    Complete by:  As directed   Walk an hour a day.  Use 20-30 minute walks.  When you can walk 30 minutes without difficulty, it is fine to restart low impact/moderate activities such as biking, jogging, swimming, sexual activity, etc.  Eventually you can increase to unrestricted activity when not feeling pain.  If you feel pain: STOP!Marland Kitchen   Let pain protect  you from overdoing it.  Use ice/heat & over-the-counter pain medications to help minimize soreness.  If that is not enough, then use your narcotic pain prescription as needed to remain active.  It is better to take extra pain medications and be more active than to stay bedridden to avoid all pain medications.     Lifting restrictions    Complete by:  As directed   Avoid heavy lifting initially, <20 pounds at first.   Do not push through pain.   You have no specific weight limit: If it hurts to do, DON'T DO IT.    If you feel no pain, you are not injuring anything.  Pain will protect you from injury.   Coughing and sneezing are far more stressful to your incision than any lifting.   Avoid resuming heavy lifting (>50 pounds) or other intense activity until off all narcotic pain medications.   When want to exercise more, give yourself 2 weeks to gradually get back to full intense exercise/activity.     May shower / Bathe    Complete by:  As directed   [place].  It is fine for dressings or wounds to be washed/rinsed.  Use gentle soap & water.  This will help the incisions  and/or wounds get clean & minimize infection.     May walk up steps    Complete by:  As directed      Sexual Activity Restrictions    Complete by:  As directed   Sexual activity as tolerated.  Do not push through pain.  Pain will protect you from injury.     Walk with assistance    Complete by:  As directed   Walk over an hour a day.  May use a walker/cane/companion to help with balance and stamina.            Medication List    TAKE these medications        ciprofloxacin 500 MG tablet  Commonly known as:  CIPRO  Take 500 mg by mouth 2 (two) times daily.     CoQ-10 100 MG Caps  Take 100 mg by mouth daily.     DULoxetine 60 MG capsule  Commonly known as:  CYMBALTA  Take 60 mg by mouth at bedtime.     esomeprazole 40 MG capsule  Commonly known as:  NEXIUM  Take 40 mg by mouth 2 (two) times daily as  needed (heartburn/ acid reflux).     HYDROmorphone 4 MG tablet  Commonly known as:  DILAUDID  Take 1-2 tablets (4-8 mg total) by mouth every 6 (six) hours as needed for moderate pain or severe pain.     hyoscyamine 0.375 MG 12 hr tablet  Commonly known as:  LEVBID  Take 0.375 mg by mouth daily.     lidocaine-prilocaine cream  Commonly known as:  EMLA  Apply to affected area once     meloxicam 15 MG tablet  Commonly known as:  MOBIC  Take 15 mg by mouth daily as needed for pain.     methocarbamol 500 MG tablet  Commonly known as:  ROBAXIN  Take 2 tablets (1,000 mg total) by mouth every 6 (six) hours as needed for muscle spasms.     metroNIDAZOLE 500 MG tablet  Commonly known as:  FLAGYL  Take 500 mg by mouth 2 (two) times daily.     neomycin 500 MG tablet  Commonly known as:  MYCIFRADIN  Take 1,000 mg by mouth once. She is to take at 2pm, 3pm and 10pm the day prior to surgery.     Normal Saline Flush 0.9 % Soln  Place 5 mLs rectally daily as needed (For peg tube.).     promethazine 25 MG tablet  Commonly known as:  PHENERGAN  Take 25 mg by mouth every 6 (six) hours as needed for nausea or vomiting.     rosuvastatin 5 MG tablet  Commonly known as:  CRESTOR  Take 5 mg by mouth at bedtime.     traMADol 50 MG tablet  Commonly known as:  ULTRAM  Take 50 mg by mouth every 4 (four) hours as needed. pain     Vitamin D (Ergocalciferol) 50000 units Caps capsule  Commonly known as:  DRISDOL  Take 50,000 Units by mouth 2 (two) times a week.      ASK your doctor about these medications        ondansetron 8 MG tablet  Commonly known as:  ZOFRAN  Take 1 tablet (8 mg total) by mouth 2 (two) times daily. Start the day after chemo for 2 days. Then take as needed for nausea or vomiting.           Follow-up Information    Follow up with  [name]., MD. Schedule an appointment as soon as possible for a visit in 2 weeks.   Specialty:  General Surgery   Why:  To follow up  after your operation, To follow up after your hospital stay   Contact information:   [place] [address] [phone]       Follow up with [place].   Why:  nurse for ostomy care and IVF   Contact information:   [address] [place] [address] [phone]        Signed: Morton Peters, M.D., F.A.C.S. Gastrointestinal and Minimally Invasive Surgery [facility] [address] [place], [place] [address] [phone] Main / Paging   11/23/2015, 10:48 AM

## 2015-12-05 ENCOUNTER — Observation Stay (HOSPITAL_COMMUNITY)
Admission: EM | Admit: 2015-12-05 | Discharge: 2015-12-07 | Disposition: A | Discharge status: Home / Self Care | Payer: BLUE CROSS/BLUE SHIELD | Attending: Surgery | Admitting: Surgery

## 2015-12-05 ENCOUNTER — Telehealth: Payer: Self-pay | Admitting: *Deleted

## 2015-12-05 ENCOUNTER — Emergency Department (HOSPITAL_COMMUNITY): Payer: BLUE CROSS/BLUE SHIELD

## 2015-12-05 ENCOUNTER — Encounter (HOSPITAL_COMMUNITY): Payer: Self-pay | Admitting: Emergency Medicine

## 2015-12-05 DIAGNOSIS — E669 Obesity, unspecified: Secondary | ICD-10-CM | POA: Insufficient documentation

## 2015-12-05 DIAGNOSIS — E860 Dehydration: Principal | ICD-10-CM | POA: Diagnosis present

## 2015-12-05 DIAGNOSIS — C20 Malignant neoplasm of rectum: Secondary | ICD-10-CM | POA: Diagnosis not present

## 2015-12-05 DIAGNOSIS — E7800 Pure hypercholesterolemia, unspecified: Secondary | ICD-10-CM | POA: Diagnosis not present

## 2015-12-05 DIAGNOSIS — I10 Essential (primary) hypertension: Secondary | ICD-10-CM | POA: Insufficient documentation

## 2015-12-05 DIAGNOSIS — IMO0001 Reserved for inherently not codable concepts without codable children: Secondary | ICD-10-CM

## 2015-12-05 DIAGNOSIS — D649 Anemia, unspecified: Secondary | ICD-10-CM | POA: Diagnosis not present

## 2015-12-05 DIAGNOSIS — N739 Female pelvic inflammatory disease, unspecified: Secondary | ICD-10-CM | POA: Diagnosis present

## 2015-12-05 DIAGNOSIS — Z923 Personal history of irradiation: Secondary | ICD-10-CM | POA: Diagnosis not present

## 2015-12-05 DIAGNOSIS — E440 Moderate protein-calorie malnutrition: Secondary | ICD-10-CM | POA: Insufficient documentation

## 2015-12-05 DIAGNOSIS — R112 Nausea with vomiting, unspecified: Secondary | ICD-10-CM | POA: Diagnosis not present

## 2015-12-05 DIAGNOSIS — Z87891 Personal history of nicotine dependence: Secondary | ICD-10-CM | POA: Insufficient documentation

## 2015-12-05 DIAGNOSIS — R109 Unspecified abdominal pain: Secondary | ICD-10-CM

## 2015-12-05 DIAGNOSIS — Z6841 Body Mass Index (BMI) 40.0 and over, adult: Secondary | ICD-10-CM | POA: Insufficient documentation

## 2015-12-05 DIAGNOSIS — K651 Peritoneal abscess: Secondary | ICD-10-CM | POA: Diagnosis not present

## 2015-12-05 DIAGNOSIS — Z79899 Other long term (current) drug therapy: Secondary | ICD-10-CM | POA: Diagnosis not present

## 2015-12-05 DIAGNOSIS — R55 Syncope and collapse: Secondary | ICD-10-CM | POA: Insufficient documentation

## 2015-12-05 DIAGNOSIS — F329 Major depressive disorder, single episode, unspecified: Secondary | ICD-10-CM | POA: Diagnosis not present

## 2015-12-05 DIAGNOSIS — Z932 Ileostomy status: Secondary | ICD-10-CM | POA: Diagnosis not present

## 2015-12-05 DIAGNOSIS — R197 Diarrhea, unspecified: Secondary | ICD-10-CM | POA: Insufficient documentation

## 2015-12-05 DIAGNOSIS — Z90710 Acquired absence of both cervix and uterus: Secondary | ICD-10-CM | POA: Insufficient documentation

## 2015-12-05 DIAGNOSIS — K5090 Crohn's disease, unspecified, without complications: Secondary | ICD-10-CM | POA: Diagnosis not present

## 2015-12-05 DIAGNOSIS — G4730 Sleep apnea, unspecified: Secondary | ICD-10-CM | POA: Insufficient documentation

## 2015-12-05 DIAGNOSIS — T814XXD Infection following a procedure, subsequent encounter: Secondary | ICD-10-CM

## 2015-12-05 DIAGNOSIS — R1110 Vomiting, unspecified: Secondary | ICD-10-CM

## 2015-12-05 DIAGNOSIS — E559 Vitamin D deficiency, unspecified: Secondary | ICD-10-CM | POA: Insufficient documentation

## 2015-12-05 DIAGNOSIS — E66813 Obesity, class 3: Secondary | ICD-10-CM

## 2015-12-05 LAB — CBC WITH DIFFERENTIAL/PLATELET
BASOS PCT: 0 %
Basophils Absolute: 0 10*3/uL (ref 0.0–0.1)
EOS PCT: 1 %
Eosinophils Absolute: 0.1 10*3/uL (ref 0.0–0.7)
HEMATOCRIT: 41.7 % (ref 36.0–46.0)
HEMOGLOBIN: 13.8 g/dL (ref 12.0–15.0)
LYMPHS ABS: 0.7 10*3/uL (ref 0.7–4.0)
LYMPHS PCT: 5 %
MCH: 28.1 pg (ref 26.0–34.0)
MCHC: 33.1 g/dL (ref 30.0–36.0)
MCV: 84.9 fL (ref 78.0–100.0)
MONOS PCT: 5 %
Monocytes Absolute: 0.7 10*3/uL (ref 0.1–1.0)
NEUTROS ABS: 12.2 10*3/uL — AB (ref 1.7–7.7)
Neutrophils Relative %: 89 %
Platelets: 514 10*3/uL — ABNORMAL HIGH (ref 150–400)
RBC: 4.91 MIL/uL (ref 3.87–5.11)
RDW: 15.5 % (ref 11.5–15.5)
WBC: 13.7 10*3/uL — ABNORMAL HIGH (ref 4.0–10.5)

## 2015-12-05 LAB — URINALYSIS, ROUTINE W REFLEX MICROSCOPIC
BILIRUBIN URINE: NEGATIVE
GLUCOSE, UA: NEGATIVE mg/dL
HGB URINE DIPSTICK: NEGATIVE
Ketones, ur: NEGATIVE mg/dL
Leukocytes, UA: NEGATIVE
Nitrite: NEGATIVE
PH: 6 (ref 5.0–8.0)
Protein, ur: NEGATIVE mg/dL

## 2015-12-05 LAB — COMPREHENSIVE METABOLIC PANEL
ALBUMIN: 3.6 g/dL (ref 3.5–5.0)
ALK PHOS: 70 U/L (ref 38–126)
ALT: 11 U/L — ABNORMAL LOW (ref 14–54)
AST: 16 U/L (ref 15–41)
Anion gap: 13 (ref 5–15)
BILIRUBIN TOTAL: 0.6 mg/dL (ref 0.3–1.2)
BUN: 9 mg/dL (ref 6–20)
CHLORIDE: 103 mmol/L (ref 101–111)
CO2: 23 mmol/L (ref 22–32)
CREATININE: 0.9 mg/dL (ref 0.44–1.00)
Calcium: 10.1 mg/dL (ref 8.9–10.3)
GFR calc Af Amer: >60 mL/min (ref >60)
GFR calc non Af Amer: >60 mL/min (ref >60)
Glucose, Bld: 181 mg/dL — ABNORMAL HIGH (ref 65–99)
POTASSIUM: 3.5 mmol/L (ref 3.5–5.1)
Sodium: 139 mmol/L (ref 135–145)
Total Protein: 7.5 g/dL (ref 6.5–8.1)

## 2015-12-05 LAB — I-STAT CHEM 8, ED
BUN: 8 mg/dL (ref 6–20)
CALCIUM ION: 1.21 mmol/L (ref 1.12–1.23)
CHLORIDE: 103 mmol/L (ref 101–111)
CREATININE: 0.8 mg/dL (ref 0.44–1.00)
GLUCOSE: 184 mg/dL — AB (ref 65–99)
HCT: 47 % — ABNORMAL HIGH (ref 36.0–46.0)
Hemoglobin: 16 g/dL — ABNORMAL HIGH (ref 12.0–15.0)
Potassium: 3.4 mmol/L — ABNORMAL LOW (ref 3.5–5.1)
SODIUM: 140 mmol/L (ref 135–145)
TCO2: 21 mmol/L (ref 0–100)

## 2015-12-05 LAB — I-STAT CG4 LACTIC ACID, ED: LACTIC ACID, VENOUS: 2.37 mmol/L — AB (ref 0.5–2.0)

## 2015-12-05 LAB — I-STAT TROPONIN, ED: TROPONIN I, POC: 0.01 ng/mL (ref 0.00–0.08)

## 2015-12-05 LAB — LIPASE, BLOOD: Lipase: 16 U/L (ref 11–51)

## 2015-12-05 LAB — CBG MONITORING, ED: Glucose-Capillary: 197 mg/dL — ABNORMAL HIGH (ref 65–99)

## 2015-12-05 MED ORDER — METOCLOPRAMIDE HCL 5 MG/ML IJ SOLN
10.0000 mg | Freq: Once | INTRAMUSCULAR | Status: AC
  Administered 2015-12-05: 10 mg via INTRAVENOUS
  Filled 2015-12-05: qty 2

## 2015-12-05 MED ORDER — SODIUM CHLORIDE 0.9 % IV BOLUS (SEPSIS)
2000.0000 mL | Freq: Once | INTRAVENOUS | Status: AC
  Administered 2015-12-05: 1000 mL via INTRAVENOUS

## 2015-12-05 MED ORDER — ENOXAPARIN SODIUM 40 MG/0.4ML ~~LOC~~ SOLN
40.0000 mg | SUBCUTANEOUS | Status: DC
  Administered 2015-12-05 – 2015-12-06 (×2): 40 mg via SUBCUTANEOUS
  Filled 2015-12-05 (×4): qty 0.4

## 2015-12-05 MED ORDER — METHOCARBAMOL 500 MG PO TABS: 500.0000 mg | ORAL_TABLET | Freq: Three times a day (TID) | ORAL | Status: DC | PRN

## 2015-12-05 MED ORDER — DULOXETINE HCL 60 MG PO CPEP
60.0000 mg | ORAL_CAPSULE | Freq: Every day | ORAL | Status: DC
  Administered 2015-12-05 – 2015-12-06 (×2): 60 mg via ORAL
  Filled 2015-12-05 (×3): qty 1

## 2015-12-05 MED ORDER — VITAMIN D (ERGOCALCIFEROL) 1.25 MG (50000 UNIT) PO CAPS
50000.0000 [IU] | ORAL_CAPSULE | ORAL | Status: DC
  Administered 2015-12-05: 50000 [IU] via ORAL
  Filled 2015-12-05 (×2): qty 1

## 2015-12-05 MED ORDER — LACTATED RINGERS IV SOLN
INTRAVENOUS | Status: DC
  Administered 2015-12-05 (×2): via INTRAVENOUS

## 2015-12-05 MED ORDER — PROMETHAZINE HCL 25 MG PO TABS
25.0000 mg | ORAL_TABLET | Freq: Three times a day (TID) | ORAL | Status: DC | PRN
  Administered 2015-12-06: 25 mg via ORAL
  Filled 2015-12-05: qty 1

## 2015-12-05 MED ORDER — IOPAMIDOL (ISOVUE-300) INJECTION 61%
100.0000 mL | Freq: Once | INTRAVENOUS | Status: AC | PRN
  Administered 2015-12-05: 100 mL via INTRAVENOUS

## 2015-12-05 MED ORDER — LOPERAMIDE HCL 2 MG PO CAPS
4.0000 mg | ORAL_CAPSULE | Freq: Two times a day (BID) | ORAL | Status: DC
  Administered 2015-12-05 – 2015-12-07 (×4): 4 mg via ORAL
  Filled 2015-12-05 (×5): qty 2

## 2015-12-05 MED ORDER — DIPHENHYDRAMINE HCL 25 MG PO CAPS: 25.0000 mg | ORAL_CAPSULE | Freq: Four times a day (QID) | ORAL | Status: DC | PRN

## 2015-12-05 MED ORDER — MORPHINE SULFATE (PF) 4 MG/ML IV SOLN
6.0000 mg | Freq: Once | INTRAVENOUS | Status: AC
  Administered 2015-12-05: 6 mg via INTRAVENOUS
  Filled 2015-12-05: qty 2

## 2015-12-05 MED ORDER — SODIUM CHLORIDE 0.9 % IV BOLUS (SEPSIS)
1000.0000 mL | Freq: Once | INTRAVENOUS | Status: AC
  Administered 2015-12-05: 1000 mL via INTRAVENOUS

## 2015-12-05 MED ORDER — HYDROMORPHONE HCL 2 MG PO TABS: 4.0000 mg | ORAL_TABLET | Freq: Four times a day (QID) | ORAL | Status: DC | PRN

## 2015-12-05 MED ORDER — DIPHENHYDRAMINE HCL 50 MG/ML IJ SOLN: 25.0000 mg | Freq: Four times a day (QID) | INTRAMUSCULAR | Status: DC | PRN

## 2015-12-05 MED ORDER — HYDRALAZINE HCL 20 MG/ML IJ SOLN: 10.0000 mg | INTRAMUSCULAR | Status: DC | PRN

## 2015-12-05 MED ORDER — PSYLLIUM 95 % PO PACK
1.0000 | PACK | Freq: Every day | ORAL | Status: DC
  Administered 2015-12-06 – 2015-12-07 (×2): 1 via ORAL
  Filled 2015-12-05 (×3): qty 1

## 2015-12-05 MED ORDER — ONDANSETRON HCL 4 MG/2ML IJ SOLN
4.0000 mg | Freq: Once | INTRAMUSCULAR | Status: AC
  Administered 2015-12-05: 4 mg via INTRAVENOUS
  Filled 2015-12-05: qty 2

## 2015-12-05 MED ORDER — ONDANSETRON HCL 4 MG/2ML IJ SOLN
4.0000 mg | Freq: Four times a day (QID) | INTRAMUSCULAR | Status: DC | PRN
  Administered 2015-12-05 – 2015-12-06 (×3): 4 mg via INTRAVENOUS
  Filled 2015-12-05 (×3): qty 2

## 2015-12-05 MED ORDER — NORMAL SALINE FLUSH 0.9 % IV SOLN
5.0000 mL | Freq: Every day | INTRAVENOUS | Status: DC | PRN
  Filled 2015-12-05: qty 5

## 2015-12-05 MED ORDER — TRAMADOL HCL 50 MG PO TABS: 50.0000 mg | ORAL_TABLET | Freq: Two times a day (BID) | ORAL | Status: DC | PRN

## 2015-12-05 MED ORDER — LACTATED RINGERS IV BOLUS (SEPSIS): 2000.0000 mL | Freq: Once | INTRAVENOUS | Status: DC

## 2015-12-05 MED ORDER — LORAZEPAM 2 MG/ML IJ SOLN
1.0000 mg | Freq: Once | INTRAMUSCULAR | Status: AC
  Administered 2015-12-05: 1 mg via INTRAVENOUS
  Filled 2015-12-05: qty 1

## 2015-12-05 MED ORDER — HYOSCYAMINE SULFATE ER 0.375 MG PO TB12
0.3750 mg | ORAL_TABLET | Freq: Every day | ORAL | Status: DC
  Administered 2015-12-06 – 2015-12-07 (×2): 0.375 mg via ORAL
  Filled 2015-12-05 (×4): qty 1

## 2015-12-05 MED ORDER — HALOPERIDOL LACTATE 5 MG/ML IJ SOLN
1.0000 mg | Freq: Once | INTRAMUSCULAR | Status: AC
  Administered 2015-12-05: 1 mg via INTRAVENOUS
  Filled 2015-12-05: qty 1

## 2015-12-05 NOTE — ED Notes (Signed)
Pt in CT.

## 2015-12-05 NOTE — Telephone Encounter (Signed)
Received call from father Lindsay Little ( on ROI ) requesting a call back from nurse.  Spoke with father and was informed that pt is currently admitted to [facility]  Today for  Dehydration.  Per Jenny Reichmann, pt passed out at home early this am and was transported to ER by ambulance.   John stated pt was very pale looking. John expressed that pt is depressed -  Pt is the sole  bread winner in the house , and just recently lost her job.   John would like to know what resources available to help pt and family. Instructed John to discuss with pt's nurse and to request for care management and/or social worker visit while pt is in the hospital. Message relayed to Dr. Burr Medico. Lindsay Little    Phone     [phone].

## 2015-12-05 NOTE — ED Notes (Signed)
MD at bedside. 

## 2015-12-05 NOTE — ED Notes (Signed)
Wound management RN called and gave advise for what equipment the pt needed. Materials called for equipment.

## 2015-12-05 NOTE — ED Notes (Signed)
Patient wants the nurse to pull labs off her IV

## 2015-12-05 NOTE — ED Notes (Signed)
Pt comes from home via ems, fainting spell, started feeling light head on the way to the bathroom, husband caught her and assisted her to the ground. Pt has colon cancer/ colostomy bag. Pt complains nausea, no fever, v/s on arrival rr 20 non labored, 104/83, SpO2 98 room air, tachcardia at 120, cbg 154. Sinus tachy. Pt has porta cath in right chest, gives 2 liters Lr each day through porta cath. 20 in lac via ems arrival.  Type 2 diabetic. Allergy in penicillin and sulfa drugs. Husband in room.  Full code, ems gave mg Zofran at 5:52am.

## 2015-12-05 NOTE — ED Notes (Signed)
Surgery at bedside.

## 2015-12-05 NOTE — H&P (Signed)
[facility] Admission Note  Lindsay Little 05/10/1966  [id number].    Requesting MD: Dr. Jola Schmidt Chief Complaint/Reason for Consult: dehydration and nausea  HPI:  50 y.o. Morbidly obese female s/p robot-assisted low anterior resection for clinical T3 N1 rectal cancer and proximal/mid rectum on 05/25/15. She developed an anastomotic leak with abscess requiring an ileostomy and JP drain (11/17/15). Presented to [facility] today with complaints of fatigue, nausea, and watery diarrhea through her ostomy bag for 24 hours. Patient reports that her symptoms all began Monday 12/04/15. Until yesterday her stool was soft and brown. She describes her stool as clear, green, watery, and occuring at increased frequency. Also endorses crampy abdominal pain. Has been tolerating a regular diet.Patient has been receiving fluids through her port-a-cath 3 times weekly. Reports trouble adhering ostomy bag to her skin due to high volume, liquid output. She denies complications with her JP drain.  She denies fever, chills, chest pain, SOB, vomiting, dysuria.    Patient states that her daughter has had the "stomach bug" and has been vomiting, but not having diarrhea.  ROS: All systems reviewed and otherwise negative except for as above  Family History  Problem Relation Age of Onset  . Coronary artery disease Mother 22  . Hypertension Mother   . Hyperlipidemia Mother   . Diabetes Mellitus I Mother   . Coronary artery disease Father   . Hyperlipidemia Father   . Hypertension Father   . Cancer Sister     skin - non melanoma  . Hyperlipidemia Brother   . Cancer Maternal Uncle 67    pancreatic with mets to colon and prostate  . Cirrhosis Maternal Uncle   . Hypertension Maternal Grandmother   . Diabetes Mellitus I Maternal Grandmother   . Hyperlipidemia Maternal Grandmother   . CVA Maternal Grandmother   . Hypertension Maternal Grandfather   . Coronary artery disease Maternal Grandfather 65  .  Hyperlipidemia Maternal Grandfather   . Coronary artery disease Paternal Grandmother   . Hypertension Paternal Grandmother   . Hyperlipidemia Paternal Grandmother   . Diabetes Mellitus I Paternal Grandmother   . Hypertension Paternal Grandfather   . Hyperlipidemia Paternal Grandfather   . Coronary artery disease Paternal Grandfather     Past Medical History  Diagnosis Date  . Hypertension   . Hypercholesteremia   . Obesity   . Tear of left rotator cuff   . IBS (irritable bowel syndrome)   . Vertigo   . TMJ (dislocation of temporomandibular joint)   . Bulging disc     L5  . Depression   . Rectal bleeding   . Vitamin D deficiency   . Osteoarthritis, knee   . S/P radiation therapy 02/20/15-03/30/15    colon/rectal  . Complication of anesthesia   . PONV (postoperative nausea and vomiting)   . History of mitral valve prolapse   . Mild sleep apnea     does not use CPAP  . History of chemotherapy   . Numbness and tingling     great toe bilat left more than right   . History of frequent ear infections   . History of frequent urinary tract infections     middle school age  . GERD (gastroesophageal reflux disease)   . Cancer ([place])   . Anemia   . History of blood transfusion   . Fibroids   . 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 Heart murmur   . Occasional tremors   .  Wears glasses   . Anxiety   . History of hiatal hernia     Past Surgical History  Procedure Laterality Date  . Rotator cuff repair    . Eus N/A 01/18/2015    Procedure: LOWER ENDOSCOPIC ULTRASOUND (EUS);  Surgeon: Arta Silence, MD;  Location: [facility];  Service: Endoscopy;  Laterality: N/A;  . Abdominal hysterectomy  1996    Removed uterus, and tubes  . Left knee arthroscopy      mult  . Tonsillectomy    . Dilation and curettage of uterus      times 2  . Xi robotic assisted lower anterior resection N/A 05/25/2015    Procedure: XI ROBOTIC ASSISTED LOWER ANTERIOR RESECTION, ,  RIGID PROCTOSCOPY, RIGHT OOPHORECTOMY;  Surgeon: Michael Boston, MD;  Location: [facility];  Service: General;  Laterality: N/A;  . Portacath placement N/A 05/25/2015    Procedure: INSERTION PORT-A-CATH;  Surgeon: Michael Boston, MD;  Location: [facility];  Service: General;  Laterality: N/A;  . Jp drain     . Ileo loop colostomy closure N/A 11/17/2015    Procedure: LAPAROSCOPIC DIVERTING LOOP ILEOSTOMY  DRAINAGE OF PELVIC ABSCESS;  Surgeon: Michael Boston, MD;  Location: [facility];  Service: General;  Laterality: N/A;  . Impaction removal  11/17/2015    Procedure: DISIMPACTION REMOVAL;  Surgeon: Michael Boston, MD;  Location: [facility];  Service: General;;  . Excision of skin tag  11/17/2015    Procedure: EXCISION OF SKIN TAG;  Surgeon: Michael Boston, MD;  Location: [facility];  Service: General;;  . Laparoscopic lysis of adhesions  11/17/2015    Procedure: LAPAROSCOPIC LYSIS OF ADHESIONS;  Surgeon: Michael Boston, MD;  Location: [facility];  Service: General;;    Social History:  reports that she quit smoking about 24 years ago. Her smoking use included Cigarettes. She has a 3.5 pack-year smoking history. She has never used smokeless tobacco. She reports that she drinks about 0.6 oz of alcohol per week. She reports that she does not use illicit drugs.  Allergies:  Allergies  Allergen Reactions  . Caine-1 [Lidocaine] Rash    Eyes swell shut; includes all caine drugs except marcaine. EMLA cream OK though (?!)  . Adhesive [Tape] Other (See Comments)    Blisters - can use paper tape  . Iron Nausea Only  . Oxycodone     NIGHTMARES. (tolerates hydrocodone or tramadol better)  . Penicillins Rash    Has patient had a PCN reaction causing immediate rash, facial/tongue/throat swelling, SOB or lightheadedness with hypotension: no Has patient had a PCN reaction causing severe rash involving mucus membranes or skin necrosis: no Has patient had a PCN reaction that required hospitalization no Has patient had a PCN reaction occurring  within the last 10 years: no If all of the above answers are "NO", then may proceed with Cephalosporin use.   . Sulfa Antibiotics Rash    Rash & Vomiting    (Not in a hospital admission)  Blood pressure 118/71, pulse 108, resp. rate 20, SpO2 95 %. Physical Exam: General: pleasant, obese white female who is laying in bed in NAD HEENT: head is normocephalic, atraumatic.  Sclera are noninjected. Ears and nose without any masses or lesions.   Heart: regular, rate, and rhythm.  No obvious murmurs, gallops, or rubs noted.  Palpable pedal pulses bilaterally Lungs: CTAB, no wheezes, rhonchi, or rales noted.  Respiratory effort nonlabored Abd: soft, NT/ND, +BS, ostomy bag in place in RLQ with dark green liquid stool and gas;  JP in LLQ with purulent output MS: all 4 extremities are symmetrical with no cyanosis, clubbing, or edema. Skin: warm and dry with no masses, lesions, or rashes Psych: A&Ox3 with an appropriate affect.  Results for orders placed or performed during the hospital encounter of 12/05/15 (from the past 48 hour(s))  CBG monitoring, ED     Status: Abnormal   Collection Time: 12/05/15  6:47 AM  Result Value Ref Range   Glucose-Capillary 197 (H) 65 - 99 mg/dL  CBC with Differential/Platelet     Status: Abnormal   Collection Time: 12/05/15  6:51 AM  Result Value Ref Range   WBC 13.7 (H) 4.0 - 10.5 K/uL    Comment: WHITE COUNT CONFIRMED ON SMEAR   RBC 4.91 3.87 - 5.11 MIL/uL   Hemoglobin 13.8 12.0 - 15.0 g/dL   HCT 41.7 36.0 - 46.0 %   MCV 84.9 78.0 - 100.0 fL   MCH 28.1 26.0 - 34.0 pg   MCHC 33.1 30.0 - 36.0 g/dL   RDW 15.5 11.5 - 15.5 %   Platelets 514 (H) 150 - 400 K/uL    Comment: RESULT REPEATED AND VERIFIED SPECIMEN CHECKED FOR CLOTS PLATELET COUNT CONFIRMED BY SMEAR    Neutrophils Relative % 89 %   Lymphocytes Relative 5 %   Monocytes Relative 5 %   Eosinophils Relative 1 %   Basophils Relative 0 %   Neutro Abs 12.2 (H) 1.7 - 7.7 K/uL   Lymphs Abs 0.7 0.7 - 4.0  K/uL   Monocytes Absolute 0.7 0.1 - 1.0 K/uL   Eosinophils Absolute 0.1 0.0 - 0.7 K/uL   Basophils Absolute 0.0 0.0 - 0.1 K/uL   Smear Review MORPHOLOGY UNREMARKABLE   Comprehensive metabolic panel     Status: Abnormal   Collection Time: 12/05/15  6:51 AM  Result Value Ref Range   Sodium 139 135 - 145 mmol/L   Potassium 3.5 3.5 - 5.1 mmol/L   Chloride 103 101 - 111 mmol/L   CO2 23 22 - 32 mmol/L   Glucose, Bld 181 (H) 65 - 99 mg/dL   BUN 9 6 - 20 mg/dL   Creatinine, Ser 0.90 0.44 - 1.00 mg/dL   Calcium 10.1 8.9 - 10.3 mg/dL   Total Protein 7.5 6.5 - 8.1 g/dL   Albumin 3.6 3.5 - 5.0 g/dL   AST 16 15 - 41 U/L   ALT 11 (L) 14 - 54 U/L   Alkaline Phosphatase 70 38 - 126 U/L   Total Bilirubin 0.6 0.3 - 1.2 mg/dL   GFR calc non Af Amer >60 >60 mL/min   GFR calc Af Amer >60 >60 mL/min    Comment: (NOTE) The eGFR has been calculated using the CKD EPI equation. This calculation has not been validated in all clinical situations. eGFR's persistently <60 mL/min signify possible Chronic Kidney Disease.    Anion gap 13 5 - 15  Lipase, blood     Status: None   Collection Time: 12/05/15  6:51 AM  Result Value Ref Range   Lipase 16 11 - 51 U/L  I-stat troponin, ED     Status: None   Collection Time: 12/05/15  6:51 AM  Result Value Ref Range   Troponin i, poc 0.01 0.00 - 0.08 ng/mL   Comment 3            Comment: Due to the release kinetics of cTnI, a negative result within the first hours of the onset of symptoms does not rule out myocardial  infarction with certainty. If myocardial infarction is still suspected, repeat the test at appropriate intervals.   I-Stat CG4 Lactic Acid, ED     Status: Abnormal   Collection Time: 12/05/15  6:52 AM  Result Value Ref Range   Lactic Acid, Venous 2.37 (HH) 0.5 - 2.0 mmol/L   Comment NOTIFIED PHYSICIAN   I-stat chem 8, ed     Status: Abnormal   Collection Time: 12/05/15  6:53 AM  Result Value Ref Range   Sodium 140 135 - 145 mmol/L    Potassium 3.4 (L) 3.5 - 5.1 mmol/L   Chloride 103 101 - 111 mmol/L   BUN 8 6 - 20 mg/dL   Creatinine, Ser 0.80 0.44 - 1.00 mg/dL   Glucose, Bld 184 (H) 65 - 99 mg/dL   Calcium, Ion 1.21 1.12 - 1.23 mmol/L   TCO2 21 0 - 100 mmol/L   Hemoglobin 16.0 (H) 12.0 - 15.0 g/dL   HCT 47.0 (H) 36.0 - 46.0 %    Assessment/Plan Dehydration and nausea  Large bowel delayed anastomotic leak s/p diverting loop ileostomy 11/17/2015 1.  Admit to Med/Surg - CBC and BMP ordered for AM 2.  Full liquid diet, IVF, pain control, antiemetics      Hold antibiotics for now - check CBC in AM (PCN and Sulfa allergy) 3.  SCD's and lovenox for DVT proph 4.  Up to chair and bathroom   Rectal adenocarcinoma s/p LAR resection 05/25/2015  Ileostomy in place   Morbid obesity with BMI of 50.0-59.9, adult   Crohn's disease   Hypertension  IBS (irritable bowel syndrome)  Depression  Pelvic abscess s/p drainage & omental pedicle flap 11/17/2015  Protein-calorie malnutrition, moderate   Pelvirectal abscess   Jill Alexanders, [facility] Surgery 12/05/2015, 3:32 PM Pager: [phone] Mon-Fri 7:00 am-4:30 pm Sat-Sun 7:00 am-11:30 am

## 2015-12-05 NOTE — ED Notes (Signed)
Bed: WA25 Expected date:  Expected time:  Means of arrival:  Comments: Near syncope/tachycardic

## 2015-12-05 NOTE — ED Notes (Signed)
16:17 pt can go to floor.

## 2015-12-05 NOTE — ED Provider Notes (Addendum)
CSN: [id number]     Arrival date & time 12/05/15  2229 History   First MD Initiated Contact with Patient 12/05/15 [phone]     Chief Complaint  Patient presents with  . Loss of Consciousness     HPI The patient presents to the ER status post robotically-assisted low anterior resection for clinical T3 N1 rectal cancer and proximal/mid rectum on 11/16. Her course was complicated with anastomatic leak and associated abscess now managed with JP drain and iliostomy. Presents today with uncontrolled nausea and vomiting and abdominal pain despite home antinausea medication. No fever. Presents with HR of 139. No dysuria. Symptoms are moderate in severity.    Past Medical History  Diagnosis Date  . Hypertension   . Hypercholesteremia   . Obesity   . Tear of left rotator cuff   . IBS (irritable bowel syndrome)   . Vertigo   . TMJ (dislocation of temporomandibular joint)   . Bulging disc     L5  . Depression   . Rectal bleeding   . Vitamin D deficiency   . Osteoarthritis, knee   . S/P radiation therapy 02/20/15-03/30/15    colon/rectal  . Complication of anesthesia   . PONV (postoperative nausea and vomiting)   . History of mitral valve prolapse   . Mild sleep apnea     does not use CPAP  . History of chemotherapy   . Numbness and tingling     great toe bilat left more than right   . History of frequent ear infections   . History of frequent urinary tract infections     middle school age  . GERD (gastroesophageal reflux disease)   . Cancer ([place])   . Anemia   . History of blood transfusion   . Fibroids   . 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 Heart murmur   . Occasional tremors   . Wears glasses   . Anxiety   . History of hiatal hernia    Past Surgical History  Procedure Laterality Date  . Rotator cuff repair    . Eus N/A 01/18/2015    Procedure: LOWER ENDOSCOPIC ULTRASOUND (EUS);  Surgeon: Arta Silence, MD;  Location: [facility];  Service: Endoscopy;   Laterality: N/A;  . Abdominal hysterectomy  1996    Removed uterus, and tubes  . Left knee arthroscopy      mult  . Tonsillectomy    . Dilation and curettage of uterus      times 2  . Xi robotic assisted lower anterior resection N/A 05/25/2015    Procedure: XI ROBOTIC ASSISTED LOWER ANTERIOR RESECTION, , RIGID PROCTOSCOPY, RIGHT OOPHORECTOMY;  Surgeon: Michael Boston, MD;  Location: [facility];  Service: General;  Laterality: N/A;  . Portacath placement N/A 05/25/2015    Procedure: INSERTION PORT-A-CATH;  Surgeon: Michael Boston, MD;  Location: [facility];  Service: General;  Laterality: N/A;  . Jp drain     . Ileo loop colostomy closure N/A 11/17/2015    Procedure: LAPAROSCOPIC DIVERTING LOOP ILEOSTOMY  DRAINAGE OF PELVIC ABSCESS;  Surgeon: Michael Boston, MD;  Location: [facility];  Service: General;  Laterality: N/A;  . Impaction removal  11/17/2015    Procedure: DISIMPACTION REMOVAL;  Surgeon: Michael Boston, MD;  Location: [facility];  Service: General;;  . Excision of skin tag  11/17/2015    Procedure: EXCISION OF SKIN TAG;  Surgeon: Michael Boston, MD;  Location: [facility];  Service: General;;  . Laparoscopic lysis of adhesions  11/17/2015    Procedure: LAPAROSCOPIC LYSIS OF ADHESIONS;  Surgeon: Michael Boston, MD;  Location: [facility];  Service: General;;   Family History  Problem Relation Age of Onset  . Coronary artery disease Mother 70  . Hypertension Mother   . Hyperlipidemia Mother   . Diabetes Mellitus I Mother   . Coronary artery disease Father   . Hyperlipidemia Father   . Hypertension Father   . Cancer Sister     skin - non melanoma  . Hyperlipidemia Brother   . Cancer Maternal Uncle 95    pancreatic with mets to colon and prostate  . Cirrhosis Maternal Uncle   . Hypertension Maternal Grandmother   . Diabetes Mellitus I Maternal Grandmother   . Hyperlipidemia Maternal Grandmother   . CVA Maternal Grandmother   . Hypertension Maternal Grandfather   . Coronary artery disease Maternal Grandfather 65   . Hyperlipidemia Maternal Grandfather   . Coronary artery disease Paternal Grandmother   . Hypertension Paternal Grandmother   . Hyperlipidemia Paternal Grandmother   . Diabetes Mellitus I Paternal Grandmother   . Hypertension Paternal Grandfather   . Hyperlipidemia Paternal Grandfather   . Coronary artery disease Paternal Grandfather    Social History  Substance Use Topics  . Smoking status: Former Smoker -- 0.50 packs/day for 7 years    Types: Cigarettes    Quit date: 07/16/1991  . Smokeless tobacco: Never Used     Comment: 1 pack/week intermittently x 7 years  . Alcohol Use: 0.6 oz/week    1 Shots of liquor per week     Comment: rarely    OB History    No data available     Review of Systems  All other systems reviewed and are negative.     Allergies  Caine-1; Adhesive; Iron; Oxycodone; Penicillins; and Sulfa antibiotics  Home Medications   Prior to Admission medications   Medication Sig Start Date End Date Taking? Authorizing Provider  Coenzyme Q10 (COQ-10) 100 MG CAPS Take 100 mg by mouth daily.    Historical Provider, MD  DULoxetine (CYMBALTA) 60 MG capsule Take 60 mg by mouth at bedtime.     Historical Provider, MD  esomeprazole (NEXIUM) 40 MG capsule Take 40 mg by mouth 2 (two) times daily as needed (heartburn/ acid reflux).     Historical Provider, MD  HYDROmorphone (DILAUDID) 4 MG tablet Take 1-2 tablets (4-8 mg total) by mouth every 6 (six) hours as needed for moderate pain or severe pain. 11/17/15   Michael Boston, MD  hyoscyamine (LEVBID) 0.375 MG 12 hr tablet Take 0.375 mg by mouth daily.     Historical Provider, MD  lidocaine-prilocaine (EMLA) cream Apply to affected area once Patient taking differently: Apply 1 application topically daily as needed (For port-a-cath.).  06/14/15   Truitt Merle, MD  meloxicam (MOBIC) 15 MG tablet Take 15 mg by mouth daily as needed for pain.     Historical Provider, MD  methocarbamol (ROBAXIN) 500 MG tablet Take 2 tablets (1,000  mg total) by mouth every 6 (six) hours as needed for muscle spasms. 09/17/15   Michael Boston, MD  metroNIDAZOLE (FLAGYL) 500 MG tablet Take 500 mg by mouth 2 (two) times daily. 10/10/15   Historical Provider, MD  neomycin (MYCIFRADIN) 500 MG tablet Take 1,000 mg by mouth once. She is to take at 2pm, 3pm and 10pm the day prior to surgery. 10/10/15   Historical Provider, MD  ondansetron (ZOFRAN) 8 MG tablet Take 1 tablet (8 mg total)  by mouth 2 (two) times daily. Start the day after chemo for 2 days. Then take as needed for nausea or vomiting. 11/23/15   Michael Boston, MD  promethazine (PHENERGAN) 25 MG tablet Take 25 mg by mouth every 6 (six) hours as needed for nausea or vomiting.    Historical Provider, MD  rosuvastatin (CRESTOR) 5 MG tablet Take 5 mg by mouth at bedtime.     Historical Provider, MD  Sodium Chloride Flush (NORMAL SALINE FLUSH) 0.9 % SOLN Place 5 mLs rectally daily as needed (For peg tube.).  07/05/15   Historical Provider, MD  traMADol (ULTRAM) 50 MG tablet Take 50 mg by mouth every 4 (four) hours as needed. pain 06/13/15   Historical Provider, MD  Vitamin D, Ergocalciferol, (DRISDOL) 50000 UNITS CAPS capsule Take 50,000 Units by mouth 2 (two) times a week.    Historical Provider, MD   BP 125/72 mmHg  Pulse 139  Resp 18  SpO2 93% Physical Exam  Constitutional: She is oriented to person, place, and time. She appears well-developed and well-nourished. No distress.  HENT:  Head: Normocephalic and atraumatic.  Eyes: EOM are normal.  Neck: Normal range of motion.  Cardiovascular: Regular rhythm and normal heart sounds.   tachycardia  Pulmonary/Chest: Effort normal and breath sounds normal.  Abdominal: Soft. She exhibits no distension.  iliostomy in Right abdomen with thin liquid stool. Generalized abdominal tenderness  Musculoskeletal: Normal range of motion.  Neurological: She is alert and oriented to person, place, and time.  Skin: Skin is warm and dry.  Psychiatric: She has a  normal mood and affect. Judgment normal.  Nursing note and vitals reviewed.   ED Course  Procedures (including critical care time) Labs Review Labs Reviewed  CBC WITH DIFFERENTIAL/PLATELET - Abnormal; Notable for the following:    WBC 13.7 (*)    Platelets 514 (*)    Neutro Abs 12.2 (*)    All other components within normal limits  COMPREHENSIVE METABOLIC PANEL - Abnormal; Notable for the following:    Glucose, Bld 181 (*)    ALT 11 (*)    All other components within normal limits  I-STAT CG4 LACTIC ACID, ED - Abnormal; Notable for the following:    Lactic Acid, Venous 2.37 (*)    All other components within normal limits  I-STAT CHEM 8, ED - Abnormal; Notable for the following:    Potassium 3.4 (*)    Glucose, Bld 184 (*)    Hemoglobin 16.0 (*)    HCT 47.0 (*)    All other components within normal limits  CBG MONITORING, ED - Abnormal; Notable for the following:    Glucose-Capillary 197 (*)    All other components within normal limits  LIPASE, BLOOD  URINALYSIS, ROUTINE W REFLEX MICROSCOPIC (NOT AT [facility])  I-STAT TROPOININ, ED    Imaging Review Ct Abdomen Pelvis W Contrast  12/05/2015  CLINICAL DATA:  Abdominal pain and vomiting. Rectal cancer diagnosed in November 2016. Low anterior resection with ongoing chemotherapy. Presacral abscess on prior scans. EXAM: CT ABDOMEN AND PELVIS WITH CONTRAST TECHNIQUE: Multidetector CT imaging of the abdomen and pelvis was performed using the standard protocol following bolus administration of intravenous contrast. CONTRAST:  174m ISOVUE-300 IOPAMIDOL (ISOVUE-300) INJECTION 61% COMPARISON:  09/15/2015 and 10/03/2015 FINDINGS: Lower chest:  Unremarkable Hepatobiliary: Similar appearance of multifocal geographic fatty infiltration of the liver. Similar appearance of right hepatic lobe hemangioma, image 17/2, not appreciably different from 10/06/2013. A second hemangioma is present peripherally in the right hepatic  lobe on image 29/2, again  similar to the 2015 MRI. No new liver lesions identified. Contracted gallbladder. Pancreas: Unremarkable Spleen: Unremarkable Adrenals/Urinary Tract: Upper normal right ureteral size without stone or obstruction identified. Adrenal glands normal. Stomach/Bowel: Right ileostomy. There thickened loops of ileum leading up to this ileostomy, with abnormal bowel wall thickening and mucosal enhancement in the right lower quadrant for example on image 62/2. This represents a significant change from the March 2017 exam. Vascular/Lymphatic: Small reactive retroperitoneal lymph nodes. Reproductive: Uterus absent.  Ovaries not well seen. Other: Presacral collection of frothy fluid and gas measures 4.0 by 6.6 by 9.9 cm (volume = 135.9 cc), with thick surrounding presacral soft tissue thickening. A drainage catheter terminates within this abscess, and extends to exit the abdomen in the left upper quadrant. There is considerable infiltration of the mesenteric tissues of the pelvis with stranding, as on image 86/2, worsened from prior. Small amount of peri splenic ascites, new. Small amount of very hepatic ascites. Musculoskeletal: Grade 1 degenerative anterolisthesis at L4-5 with suspected impingement at the L4-5 level. Small track of density extending from the presacral collection through the right gluteus muscle to the scan on image 95 of series 2, at the site of a prior catheter ; continued fistulous connection not excluded, correlate with any drainage. IMPRESSION: 1. Presacral abscess collection is slightly larger, measuring 136 cc compared to prior 115 cc. This contains gas and frothy fluid. A left upper quadrant drainage catheter extends into this collection. The prior right posterior catheter has been removed, but there is a residual track at the prior catheter site. Abnormal wall thickening in the ileum extending towards the ileostomy site, significance uncertain, this could be from inflammatory bowel disease, infection,  or much less likely vascular causes. 2. There is worsened edema in the pelvic mesentery, infiltrative. There is also small perihepatic and perisplenic ascites. 3. Several stable hepatic hemangiomas with multifocal geographic fatty infiltration of the liver. No new liver lesion identified. Electronically Signed   By: Van Clines M.D.   On: 12/05/2015 13:53   Dg Chest Port 1 View  12/05/2015  CLINICAL DATA:  Syncope at home this morning. EXAM: PORTABLE CHEST 1 VIEW COMPARISON:  05/25/2015 FINDINGS: Tip of the right chest port in the mid SVC. The cardiomediastinal contours are normal. Pulmonary vasculature is normal. No consolidation, pleural effusion, or pneumothorax. No acute osseous abnormalities are seen. IMPRESSION: No acute process.  Right chest port in place. Electronically Signed   By: Jeb Levering M.D.   On: 12/05/2015 06:53   Dg Abd 2 Views  12/05/2015  CLINICAL DATA:  New onset of nausea and lower abdominal pain. Syncopal episode earlier. Personal history of rectal cancer with colostomy. EXAM: ABDOMEN - 2 VIEW COMPARISON:  CT abdomen and pelvis 10/03/2015 FINDINGS: A drainage tube projects over left side of abdomen extends over the anatomic pelvis. This was not present on the prior CT scans. Its position is indeterminate on these AP films. Wall thickening is noted in the descending colon. This may wrist *---* the there is no obstruction or free air. This may be related to bowel inflammation. The axial skeleton demonstrates degenerative change. IMPRESSION: 1. Wall thickening within residual left-sided colon or potentially left-sided small bowel suggesting acute inflammatory process. 2. No associated obstruction. 3. Left-sided drainage tube.  Position of this tube is unclear. Electronically Signed   By: San Morelle M.D.   On: 12/05/2015 10:49   I have personally reviewed and evaluated these images and lab results  as part of my medical decision-making.   EKG  Interpretation   Date/Time:  Tuesday Dec 05 2015 06:28:09 EDT Ventricular Rate:  136 PR Interval:  151 QRS Duration: 87 QT Interval:  286 QTC Calculation: 430 R Axis:   13 Text Interpretation:  Sinus tachycardia Probable left atrial enlargement  Anterior infarct, old Nonspecific repol abnormality, diffuse leads  nonspecifc  changes since prior tracing in March 2011 Confirmed by Nester Bachus   MD, Lennette Bihari ([id number]) on 12/05/2015 7:17:06 AM      MDM   Final diagnoses:  None    2:20 PM Continues to have nausea despite multiple antinausea medications. CT concerning for possible increasing abscess. Will admit for ongoing workup, treatment and management of her severe nausea   Jola Schmidt, MD 12/05/15 [place], MD 12/05/15 [phone]

## 2015-12-06 ENCOUNTER — Telehealth: Payer: Self-pay | Admitting: *Deleted

## 2015-12-06 DIAGNOSIS — N739 Female pelvic inflammatory disease, unspecified: Secondary | ICD-10-CM

## 2015-12-06 DIAGNOSIS — C20 Malignant neoplasm of rectum: Secondary | ICD-10-CM | POA: Diagnosis not present

## 2015-12-06 DIAGNOSIS — E860 Dehydration: Secondary | ICD-10-CM

## 2015-12-06 DIAGNOSIS — R110 Nausea: Secondary | ICD-10-CM | POA: Diagnosis not present

## 2015-12-06 DIAGNOSIS — D649 Anemia, unspecified: Secondary | ICD-10-CM

## 2015-12-06 DIAGNOSIS — R55 Syncope and collapse: Secondary | ICD-10-CM | POA: Diagnosis not present

## 2015-12-06 DIAGNOSIS — E46 Unspecified protein-calorie malnutrition: Secondary | ICD-10-CM

## 2015-12-06 LAB — CBC
HCT: 33.6 % — ABNORMAL LOW (ref 36.0–46.0)
HEMOGLOBIN: 10.8 g/dL — AB (ref 12.0–15.0)
MCH: 28.3 pg (ref 26.0–34.0)
MCHC: 32.1 g/dL (ref 30.0–36.0)
MCV: 88 fL (ref 78.0–100.0)
Platelets: 357 10*3/uL (ref 150–400)
RBC: 3.82 MIL/uL — ABNORMAL LOW (ref 3.87–5.11)
RDW: 15.8 % — ABNORMAL HIGH (ref 11.5–15.5)
WBC: 7.6 10*3/uL (ref 4.0–10.5)

## 2015-12-06 LAB — BASIC METABOLIC PANEL
ANION GAP: 10 (ref 5–15)
BUN: 6 mg/dL (ref 6–20)
CALCIUM: 9.3 mg/dL (ref 8.9–10.3)
CO2: 20 mmol/L — AB (ref 22–32)
Chloride: 106 mmol/L (ref 101–111)
Creatinine, Ser: 0.68 mg/dL (ref 0.44–1.00)
GFR calc Af Amer: >60 mL/min (ref >60)
GFR calc non Af Amer: >60 mL/min (ref >60)
GLUCOSE: 101 mg/dL — AB (ref 65–99)
Potassium: 3.6 mmol/L (ref 3.5–5.1)
Sodium: 136 mmol/L (ref 135–145)

## 2015-12-06 MED ORDER — ALTEPLASE 2 MG IJ SOLR
2.0000 mg | Freq: Once | INTRAMUSCULAR | Status: AC
  Administered 2015-12-06: 2 mg
  Filled 2015-12-06: qty 2

## 2015-12-06 MED ORDER — SODIUM CHLORIDE 0.9% FLUSH
10.0000 mL | INTRAVENOUS | Status: DC | PRN
  Administered 2015-12-07: 10 mL
  Filled 2015-12-06: qty 40

## 2015-12-06 MED ORDER — SODIUM CHLORIDE 0.9% FLUSH
10.0000 mL | Freq: Two times a day (BID) | INTRAVENOUS | Status: DC
  Administered 2015-12-06: 20 mL
  Administered 2015-12-06 – 2015-12-07 (×2): 10 mL

## 2015-12-06 MED ORDER — LIDOCAINE-PRILOCAINE 2.5-2.5 % EX CREA
1.0000 "application " | TOPICAL_CREAM | Freq: Every day | CUTANEOUS | Status: DC | PRN
  Administered 2015-12-06: 1 via TOPICAL
  Filled 2015-12-06: qty 5

## 2015-12-06 NOTE — Consult Note (Addendum)
WOC ostomy follow up Stoma type/location: RUQ ileostomy   Stomal assessment/size: 1 and 1/8 x 1 and 3/8 inch oval Peristomal assessment: not seen today as ostomy pouching system applied on Tuesday is intact.  Patient and spouse report that peristomal skin is much improved over last week.  Pouch applied on Friday, stayed in place for 4 days.  Pouch applied yesterday has been in place 2 days.  Both patient and husband are independent in ostomy care.  Patient and spouse report that she had a virus and did not start Immodium, resulting in dehydration. Treatment options for stomal/peristomal skin: ostomy powder, no sting wipe, 2 skin barrier rings, flat pouch Output thin brown stool, thickening Ostomy pouching: 1pc.flat pouch, 2 skin barrier rings Education provided: Pouches and rings supplied to bedside.  Patient will resume [facility] services upon discharge. Enrolled patient in [place] Start Discharge program: Yes [facility] nursing team will follow, and will remain available to this patient, the nursing, surgical and medical teams.   Thanks, Maudie Flakes, MSN, RN, [place], Arther Abbott  Pager# [phone]

## 2015-12-06 NOTE — Telephone Encounter (Signed)
Spoke with [place], SW @ [facility] today and informed her of pt's father Lindsay Little call from yesterday about finding available resources for pt and her family.   Lindsay Little voiced understanding and stated she would try to look into available resources and will reach out to talk to pt.  Dr. Burr Medico made aware.

## 2015-12-06 NOTE — Progress Notes (Signed)
Lindsay Little   DOB:1966-06-24   BB#:[id number]   IHW#:[id number]  Oncology follow up  Subjective: Pt's father called me and informed me about her admission. She is well-known to me, she underwent diverging loop ileostomy on May 6 fifth 2017, due to large bell delayed anastomotic leak. She was getting IVF three time a week at home due to high volume ileostomy output, and she was admitted for dehydration and syncope. She feels much better today after hydration.    Objective:  Filed Vitals:   12/06/15 1009 12/06/15 1508  BP: 121/62   Pulse: 76   Temp: 97.9 F (36.6 C) 98.3 F (36.8 C)  Resp: 18     Body mass index is 41.22 kg/(m^2).  Intake/Output Summary (Last 24 hours) at 12/06/15 2218 Last data filed at 12/06/15 2125  Gross per 24 hour  Intake 1853.75 ml  Output   2400 ml  Net -546.25 ml     Sclerae unicteric  Oropharynx clear  No peripheral adenopathy  Lungs clear -- no rales or rhonchi  Heart regular rate and rhythm  Abdomen soft, (+) ileostomy back and left side draining J-tube.  MSK no focal spinal tenderness, no peripheral edema  Neuro nonfocal   CBG (last 3)   Recent Labs  12/05/15 0647  GLUCAP 197*     Labs:  Lab Results  Component Value Date   WBC 7.6 12/06/2015   HGB 10.8* 12/06/2015   HCT 33.6* 12/06/2015   MCV 88.0 12/06/2015   PLT 357 12/06/2015   NEUTROABS 12.2* 12/05/2015    @LASTCHEMISTRY @  Urine Studies No results for input(s): UHGB, CRYS in the last 72 hours.  Invalid input(s): UACOL, UAPR, USPG, UPH, UTP, UGL, UKET, UBIL, UNIT, UROB, ULEU, UEPI, UWBC, URBC, UBAC, CAST, [place], [place]  Basic Metabolic Panel:  Recent Labs Lab 12/05/15 0651 12/05/15 0653 12/06/15 0457  NA 139 140 136  K 3.5 3.4* 3.6  CL 103 103 106  CO2 23  --  20*  GLUCOSE 181* 184* 101*  BUN 9 8 6   CREATININE 0.90 0.80 0.68  CALCIUM 10.1  --  9.3   GFR Estimated Creatinine Clearance: 130.5 mL/min (by C-G formula based on Cr of 0.68). Liver Function  Tests:  Recent Labs Lab 12/05/15 0651  AST 16  ALT 11*  ALKPHOS 70  BILITOT 0.6  PROT 7.5  ALBUMIN 3.6    Recent Labs Lab 12/05/15 0651  LIPASE 16   No results for input(s): AMMONIA in the last 168 hours. Coagulation profile No results for input(s): INR, PROTIME in the last 168 hours.  CBC:  Recent Labs Lab 12/05/15 0651 12/05/15 0653 12/06/15 0457  WBC 13.7*  --  7.6  NEUTROABS 12.2*  --   --   HGB 13.8 16.0* 10.8*  HCT 41.7 47.0* 33.6*  MCV 84.9  --  88.0  PLT 514*  --  357   Cardiac Enzymes: No results for input(s): CKTOTAL, CKMB, CKMBINDEX, TROPONINI in the last 168 hours. BNP: Invalid input(s): POCBNP CBG:  Recent Labs Lab 12/05/15 0647  GLUCAP 197*   D-Dimer No results for input(s): DDIMER in the last 72 hours. Hgb A1c No results for input(s): HGBA1C in the last 72 hours. Lipid Profile No results for input(s): CHOL, HDL, LDLCALC, TRIG, CHOLHDL, LDLDIRECT in the last 72 hours. Thyroid function studies No results for input(s): TSH, T4TOTAL, T3FREE, THYROIDAB in the last 72 hours.  Invalid input(s): FREET3 Anemia work up No results for input(s): VITAMINB12, FOLATE, FERRITIN, TIBC, IRON,  RETICCTPCT in the last 72 hours. Microbiology No results found for this or any previous visit (from the past 240 hour(s)).    Studies:  Ct Abdomen Pelvis W Contrast  12/05/2015  CLINICAL DATA:  Abdominal pain and vomiting. Rectal cancer diagnosed in November 2016. Low anterior resection with ongoing chemotherapy. Presacral abscess on prior scans. EXAM: CT ABDOMEN AND PELVIS WITH CONTRAST TECHNIQUE: Multidetector CT imaging of the abdomen and pelvis was performed using the standard protocol following bolus administration of intravenous contrast. CONTRAST:  138m ISOVUE-300 IOPAMIDOL (ISOVUE-300) INJECTION 61% COMPARISON:  09/15/2015 and 10/03/2015 FINDINGS: Lower chest:  Unremarkable Hepatobiliary: Similar appearance of multifocal geographic fatty infiltration of the  liver. Similar appearance of right hepatic lobe hemangioma, image 17/2, not appreciably different from 10/06/2013. A second hemangioma is present peripherally in the right hepatic lobe on image 29/2, again similar to the 2015 MRI. No new liver lesions identified. Contracted gallbladder. Pancreas: Unremarkable Spleen: Unremarkable Adrenals/Urinary Tract: Upper normal right ureteral size without stone or obstruction identified. Adrenal glands normal. Stomach/Bowel: Right ileostomy. There thickened loops of ileum leading up to this ileostomy, with abnormal bowel wall thickening and mucosal enhancement in the right lower quadrant for example on image 62/2. This represents a significant change from the March 2017 exam. Vascular/Lymphatic: Small reactive retroperitoneal lymph nodes. Reproductive: Uterus absent.  Ovaries not well seen. Other: Presacral collection of frothy fluid and gas measures 4.0 by 6.6 by 9.9 cm (volume = 135.9 cc), with thick surrounding presacral soft tissue thickening. A drainage catheter terminates within this abscess, and extends to exit the abdomen in the left upper quadrant. There is considerable infiltration of the mesenteric tissues of the pelvis with stranding, as on image 86/2, worsened from prior. Small amount of peri splenic ascites, new. Small amount of very hepatic ascites. Musculoskeletal: Grade 1 degenerative anterolisthesis at L4-5 with suspected impingement at the L4-5 level. Small track of density extending from the presacral collection through the right gluteus muscle to the scan on image 95 of series 2, at the site of a prior catheter ; continued fistulous connection not excluded, correlate with any drainage. IMPRESSION: 1. Presacral abscess collection is slightly larger, measuring 136 cc compared to prior 115 cc. This contains gas and frothy fluid. A left upper quadrant drainage catheter extends into this collection. The prior right posterior catheter has been removed, but there  is a residual track at the prior catheter site. Abnormal wall thickening in the ileum extending towards the ileostomy site, significance uncertain, this could be from inflammatory bowel disease, infection, or much less likely vascular causes. 2. There is worsened edema in the pelvic mesentery, infiltrative. There is also small perihepatic and perisplenic ascites. 3. Several stable hepatic hemangiomas with multifocal geographic fatty infiltration of the liver. No new liver lesion identified. Electronically Signed   By: WVan ClinesM.D.   On: 12/05/2015 13:53   Dg Chest Port 1 View  12/05/2015  CLINICAL DATA:  Syncope at home this morning. EXAM: PORTABLE CHEST 1 VIEW COMPARISON:  05/25/2015 FINDINGS: Tip of the right chest port in the mid SVC. The cardiomediastinal contours are normal. Pulmonary vasculature is normal. No consolidation, pleural effusion, or pneumothorax. No acute osseous abnormalities are seen. IMPRESSION: No acute process.  Right chest port in place. Electronically Signed   By: MJeb LeveringM.D.   On: 12/05/2015 06:53   Dg Abd 2 Views  12/05/2015  CLINICAL DATA:  New onset of nausea and lower abdominal pain. Syncopal episode earlier. Personal history of rectal  cancer with colostomy. EXAM: ABDOMEN - 2 VIEW COMPARISON:  CT abdomen and pelvis 10/03/2015 FINDINGS: A drainage tube projects over left side of abdomen extends over the anatomic pelvis. This was not present on the prior CT scans. Its position is indeterminate on these AP films. Wall thickening is noted in the descending colon. This may wrist the there is no obstruction or free air. This may be related to bowel inflammation. The axial skeleton demonstrates degenerative change. IMPRESSION: 1. Wall thickening within residual left-sided colon or potentially left-sided small bowel suggesting acute inflammatory process. 2. No associated obstruction. 3. Left-sided drainage tube.  Position of this tube is unclear. Electronically Signed    By: San Morelle M.D.   On: 12/05/2015 10:49    Assessment: 50 y.o.  1. Dehydration and syncope  2. Nausea, possible viral infection  3. Rectal adenocarcinoma, status post new adjuvant chemoradiation and LAR resection 4. Pelvic abscess status post drainage and omental pedicle flap 11/16/2068 5. Morbid obesity 6. Malnutrition 7. Mild anemia   Plan:  -her recent CT showed no evidence of cancer recurrence  -her pelvic abscess is resolving slowly  -she likely needs frequent IVF after discharge, she has home care  -she has been quite depressed due to her post-op complications, slow recovery and recent job loss  -SW follow up  -I will follow her in my clinic    Truitt Merle, MD 12/06/2015  10:18 PM

## 2015-12-06 NOTE — Progress Notes (Signed)
[facility]  Patient Status:   Active pt with [facility] prior to this readmission  [facility] is providing the following services: [facility] and [facility] for IV fluids for hydration at home.  [facility] hospital team will follow [name] while she is an inpatient at [facility] to support any orders for home to ensure a smooth transition.   If patient discharges after hours, please call [phone].   Larry Sierras 12/06/2015, 6:44 AM

## 2015-12-06 NOTE — Progress Notes (Signed)
[facility] Progress Note     Subjective: Pt in bed in NAD. Husband in the room. States she did not sleep much due to the beeping of her IVF drip. Denies abdominal pain and nausea. Changed ostomy bag, full of brown, liquid stools, 4 times overnight/this morning. Urinating without hesitancy. On full liquid diet - has not eaten yet, tray in the room.   24h stool OP: 500 24h drain OP: 15  Objective: Vital signs in last 24 hours: Temp:  [97.9 F (36.6 C)-98.8 F (37.1 C)] 97.9 F (36.6 C) (05/24 1009) Pulse Rate:  [66-107] 76 (05/24 1009) Resp:  [18-20] 18 (05/24 1009) BP: (121-156)/(61-88) 121/62 mmHg (05/24 1009) SpO2:  [95 %-100 %] 97 % (05/24 1009) Weight:  [134 kg (295 lb 6.7 oz)] 134 kg (295 lb 6.7 oz) (05/23 1730) Last BM Date: 12/05/15 (ileostomy)  Intake/Output from previous day: 05/23 0701 - 05/24 0700 In: 2338.8 [P.O.:720; I.V.:1618.8] Out: 1115 [Urine:600; Drains:15; Stool:500] Intake/Output this shift:   PE: Gen:  Alert, NAD, pleasant Card:  RRR, no M/G/R heard Pulm:  CTA, no W/R/R Abd: Soft, NT/ND, +BS, no HSM, ostomy bag in place in RLQ with brown liquid stool; left JP drain with purulent output. Ext:  No erythema, edema, or tenderness   Lab Results:   Recent Labs  12/05/15 0651 12/05/15 0653 12/06/15 0457  WBC 13.7*  --  7.6  HGB 13.8 16.0* 10.8*  HCT 41.7 47.0* 33.6*  PLT 514*  --  357   BMET  Recent Labs  12/05/15 0651 12/05/15 0653 12/06/15 0457  NA 139 140 136  K 3.5 3.4* 3.6  CL 103 103 106  CO2 23  --  20*  GLUCOSE 181* 184* 101*  BUN 9 8 6   CREATININE 0.90 0.80 0.68  CALCIUM 10.1  --  9.3   PT/INR No results for input(s): LABPROT, INR in the last 72 hours. CMP     Component Value Date/Time   NA 136 12/06/2015 0457   NA 138 11/10/2015 1007   K 3.6 12/06/2015 0457   K 3.8 11/10/2015 1007   CL 106 12/06/2015 0457   CO2 20* 12/06/2015 0457   CO2 24 11/10/2015 1007   GLUCOSE 101* 12/06/2015 0457   GLUCOSE 127  11/10/2015 1007   BUN 6 12/06/2015 0457   BUN 7.4 11/10/2015 1007   CREATININE 0.68 12/06/2015 0457   CREATININE 0.7 11/10/2015 1007   CALCIUM 9.3 12/06/2015 0457   CALCIUM 10.0 11/10/2015 1007   PROT 7.5 12/05/2015 0651   PROT 7.4 11/10/2015 1007   ALBUMIN 3.6 12/05/2015 0651   ALBUMIN 3.0* 11/10/2015 1007   AST 16 12/05/2015 0651   AST 10 11/10/2015 1007   ALT 11* 12/05/2015 0651   ALT <9 11/10/2015 1007   ALKPHOS 70 12/05/2015 0651   ALKPHOS 69 11/10/2015 1007   BILITOT 0.6 12/05/2015 0651   BILITOT 0.40 11/10/2015 1007   GFRNONAA >60 12/06/2015 0457   GFRAA >60 12/06/2015 0457   Lipase     Component Value Date/Time   LIPASE 16 12/05/2015 0651   Anti-infectives: Anti-infectives    None     Assessment/Plan Dehydration and nausea  Large bowel delayed anastomotic leak s/p diverting loop ileostomy 11/17/2015 - Saline lock, pain control, antiemetics - Hold antibiotics for now - WBC's 7.9 today.  - Orthostatics - Up to chair and bathroom FEN - Full liquid diet, advance to soft at dinner. DVT Proph - SCD's and lovenox Dispo- Med/Surg floor  Rectal adenocarcinoma s/p LAR resection 05/25/2015  Ileostomy in place   Morbid obesity with BMI of 50.0-59.9, adult   Crohn's disease   Hypertension  IBS (irritable bowel syndrome)  Depression  Pelvic abscess s/p drainage & omental pedicle flap 11/17/2015  Protein-calorie malnutrition, moderate   Pelvirectal abscess    Jill Alexanders , [facility] 12/06/2015, 12:45 PM Pager: [phone] Mon-Fri 7:00 am-4:30 pm Sat-Sun 7:00 am-11:30 am

## 2015-12-07 MED ORDER — HYDROCODONE-ACETAMINOPHEN 5-325 MG PO TABS: 1.0000 | ORAL_TABLET | Freq: Four times a day (QID) | ORAL | Status: DC | PRN

## 2015-12-07 MED ORDER — HEPARIN SOD (PORK) LOCK FLUSH 100 UNIT/ML IV SOLN
500.0000 [IU] | INTRAVENOUS | Status: DC
  Filled 2015-12-07: qty 5

## 2015-12-07 MED ORDER — HEPARIN SOD (PORK) LOCK FLUSH 100 UNIT/ML IV SOLN
500.0000 [IU] | INTRAVENOUS | Status: DC | PRN
  Administered 2015-12-07: 500 [IU]
  Filled 2015-12-07 (×2): qty 5

## 2015-12-07 NOTE — Progress Notes (Signed)
Spoke with patient before discharge. Pt expressed she isnt sure she needs a pain med as strong as Dilaudid anymore, and she only has 2-4 pills left. Discussed prescribing Percocet or NORCO vs trying to control with tramadol alone. Discharging patient with [facility]. Discussed with pt and she understands never to take [facility] with Dilaudid.  Obie Dredge, PA-C [facility] Pager: [phone] Mon-Fri 7:00 am-4:30 pm Sat-Sun 7:00 am-11:30 am

## 2015-12-07 NOTE — Progress Notes (Signed)
[facility] Progress Note     Subjective: Pt sitting up in bed, eating breakfast. In much better spirits today and looks more alert. Pt was very happy with her experience with WOC yesterday. Tolerating PO. Reports nausea and restlessness overnight, states her mom has restless leg syndrome and she thinks she has it too. Denies vomiting last night. Stool is becoming soft rather than liquid with use of immodium. Changing ostomy bag less frequently. Denies abdominal pain. Urinating without hesitancy.   Discussed restarting 2L fluids 3x weekly at home starting tomorrow.   24h stool: 1225 24h JP: 0  Objective: Vital signs in last 24 hours: Temp:  [97.9 F (36.6 C)-98.3 F (36.8 C)] 98.3 F (36.8 C) (05/25 0603) Pulse Rate:  [65-76] 65 (05/25 0603) Resp:  [16-18] 16 (05/25 0603) BP: (121-153)/(62-78) 140/67 mmHg (05/25 0603) SpO2:  [94 %-100 %] 94 % (05/25 0603) Last BM Date: 12/07/15  Intake/Output from previous day: 05/24 0701 - 05/25 0700 In: 437.5 [I.V.:437.5] Out: 3050 [Urine:1825; Stool:1225] Intake/Output this shift:   PE: Gen:  Alert, NAD, pleasant Card:  RRR, no M/G/R heard Pulm:  CTA, no W/R/R Abd: Soft, NT/ND, +BS Ext: pedal pulses palpable.   Lab Results:   Recent Labs  12/05/15 0651 12/05/15 0653 12/06/15 0457  WBC 13.7*  --  7.6  HGB 13.8 16.0* 10.8*  HCT 41.7 47.0* 33.6*  PLT 514*  --  357   BMET  Recent Labs  12/05/15 0651 12/05/15 0653 12/06/15 0457  NA 139 140 136  K 3.5 3.4* 3.6  CL 103 103 106  CO2 23  --  20*  GLUCOSE 181* 184* 101*  BUN 9 8 6   CREATININE 0.90 0.80 0.68  CALCIUM 10.1  --  9.3   PT/INR No results for input(s): LABPROT, INR in the last 72 hours. CMP     Component Value Date/Time   NA 136 12/06/2015 0457   NA 138 11/10/2015 1007   K 3.6 12/06/2015 0457   K 3.8 11/10/2015 1007   CL 106 12/06/2015 0457   CO2 20* 12/06/2015 0457   CO2 24 11/10/2015 1007   GLUCOSE 101* 12/06/2015 0457   GLUCOSE 127  11/10/2015 1007   BUN 6 12/06/2015 0457   BUN 7.4 11/10/2015 1007   CREATININE 0.68 12/06/2015 0457   CREATININE 0.7 11/10/2015 1007   CALCIUM 9.3 12/06/2015 0457   CALCIUM 10.0 11/10/2015 1007   PROT 7.5 12/05/2015 0651   PROT 7.4 11/10/2015 1007   ALBUMIN 3.6 12/05/2015 0651   ALBUMIN 3.0* 11/10/2015 1007   AST 16 12/05/2015 0651   AST 10 11/10/2015 1007   ALT 11* 12/05/2015 0651   ALT <9 11/10/2015 1007   ALKPHOS 70 12/05/2015 0651   ALKPHOS 69 11/10/2015 1007   BILITOT 0.6 12/05/2015 0651   BILITOT 0.40 11/10/2015 1007   GFRNONAA >60 12/06/2015 0457   GFRAA >60 12/06/2015 0457   Lipase     Component Value Date/Time   LIPASE 16 12/05/2015 0651   Studies/Results: Ct Abdomen Pelvis W Contrast  12/05/2015  CLINICAL DATA:  Abdominal pain and vomiting. Rectal cancer diagnosed in November 2016. Low anterior resection with ongoing chemotherapy. Presacral abscess on prior scans. EXAM: CT ABDOMEN AND PELVIS WITH CONTRAST TECHNIQUE: Multidetector CT imaging of the abdomen and pelvis was performed using the standard protocol following bolus administration of intravenous contrast. CONTRAST:  114m ISOVUE-300 IOPAMIDOL (ISOVUE-300) INJECTION 61% COMPARISON:  09/15/2015 and 10/03/2015 FINDINGS: Lower chest:  Unremarkable Hepatobiliary: Similar appearance of multifocal  geographic fatty infiltration of the liver. Similar appearance of right hepatic lobe hemangioma, image 17/2, not appreciably different from 10/06/2013. A second hemangioma is present peripherally in the right hepatic lobe on image 29/2, again similar to the 2015 MRI. No new liver lesions identified. Contracted gallbladder. Pancreas: Unremarkable Spleen: Unremarkable Adrenals/Urinary Tract: Upper normal right ureteral size without stone or obstruction identified. Adrenal glands normal. Stomach/Bowel: Right ileostomy. There thickened loops of ileum leading up to this ileostomy, with abnormal bowel wall thickening and mucosal enhancement  in the right lower quadrant for example on image 62/2. This represents a significant change from the March 2017 exam. Vascular/Lymphatic: Small reactive retroperitoneal lymph nodes. Reproductive: Uterus absent.  Ovaries not well seen. Other: Presacral collection of frothy fluid and gas measures 4.0 by 6.6 by 9.9 cm (volume = 135.9 cc), with thick surrounding presacral soft tissue thickening. A drainage catheter terminates within this abscess, and extends to exit the abdomen in the left upper quadrant. There is considerable infiltration of the mesenteric tissues of the pelvis with stranding, as on image 86/2, worsened from prior. Small amount of peri splenic ascites, new. Small amount of very hepatic ascites. Musculoskeletal: Grade 1 degenerative anterolisthesis at L4-5 with suspected impingement at the L4-5 level. Small track of density extending from the presacral collection through the right gluteus muscle to the scan on image 95 of series 2, at the site of a prior catheter ; continued fistulous connection not excluded, correlate with any drainage. IMPRESSION: 1. Presacral abscess collection is slightly larger, measuring 136 cc compared to prior 115 cc. This contains gas and frothy fluid. A left upper quadrant drainage catheter extends into this collection. The prior right posterior catheter has been removed, but there is a residual track at the prior catheter site. Abnormal wall thickening in the ileum extending towards the ileostomy site, significance uncertain, this could be from inflammatory bowel disease, infection, or much less likely vascular causes. 2. There is worsened edema in the pelvic mesentery, infiltrative. There is also small perihepatic and perisplenic ascites. 3. Several stable hepatic hemangiomas with multifocal geographic fatty infiltration of the liver. No new liver lesion identified. Electronically Signed   By: Van Clines M.D.   On: 12/05/2015 13:53    Anti-infectives: Anti-infectives    None     Assessment/Plan Dehydration and nausea  Large bowel delayed anastomotic leak s/p diverting loop ileostomy 11/17/2015 - continue saline lock, pain control, antiemetics FEN - soft diet DVT Proph - SCD's and lovenox Dispo- Med/Surg floor, likely discharge today.  - pt to continue 2L fluids 3x weekly  - post-op follow-up appointment 01/09/16 at 10 am   Rectal adenocarcinoma s/p LAR resection 05/25/2015  Ileostomy in place   Morbid obesity with BMI of 50.0-59.9, adult   Crohn's disease   Hypertension  IBS (irritable bowel syndrome)  Depression  Pelvic abscess s/p drainage & omental pedicle flap 11/17/2015  Protein-calorie malnutrition, moderate   Pelvirectal abscess     Jill Alexanders , [facility] 12/07/2015, 8:34 AM Pager: [phone] Mon-Fri 7:00 am-4:30 pm Sat-Sun 7:00 am-11:30 am

## 2015-12-07 NOTE — Care Management Note (Signed)
Case Management Note  Patient Details  Name: Lindsay Little MRN: [id number] Date of Birth: 1966-04-08  Subjective/Objective:  Admitted with dehydration                  Action/Plan: Discharge planning, active with [facility] for [facility] services, contacted [facility] to make them aware of likely d/c today and need to continue home IVF  Expected Discharge Date:   (unknown)               Expected Discharge Plan:  [place]  In-House Referral:  NA  Discharge planning Services  CM Consult  Post Acute Care Choice:  Home Health Choice offered to:  Patient  DME Arranged:  N/A DME Agency:  NA  HH Arranged:  RN, Disease Management [place] Agency:  [place]  Status of Service:  Completed, signed off  Medicare Important Message Given:    Date Medicare IM Given:    Medicare IM give by:    Date Additional Medicare IM Given:    Additional Medicare Important Message give by:     If discussed at [place] of Stay Meetings, dates discussed:    Additional Comments:  Guadalupe Maple, RN 12/07/2015, 11:30 AM [phone]

## 2015-12-07 NOTE — Discharge Summary (Signed)
Lindsay Little   [name] MRN: [id number] DOB/AGE: January 21, 1966 50 y.o.  Admit date: 12/05/2015 Discharge date: 12/07/2015  Admitting Diagnosis: dehydration, nausea  Discharge Diagnosis Patient Active Problem List   Diagnosis Date Noted  . Dehydration 12/05/2015  . Hypokalemia 11/23/2015  . Large bowel delayed anastomotic leak s/p diverting loop ileostomy 11/17/2015 11/17/2015  . Ileostomy in place [facility]) 11/17/2015  . Pelvirectal abscess ([place]) 09/16/2015  . 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 Protein-calorie malnutrition, moderate ([place]) 06/25/2015  . Pelvic abscess s/p drainage & omental pedicle flap 11/17/2015 06/23/2015  . Anemia of chronic disease 06/14/2015  . Hypertension   . IBS (irritable bowel syndrome)   . Depression   . Cavernous hemangioma of liver - segments 5 & 7 02/07/2015  . Osteoarthritis of left knee 02/07/2015  . Crohn's disease ([place]) 02/07/2015  . Rectal adenocarcinoma s/p LAR resection 05/25/2015 01/31/2015  . Pure hypercholesterolemia 02/21/2014  . Morbid obesity with BMI of 50.0-59.9, adult ([place]) 02/21/2014  . Family history of ischemic heart disease 02/21/2014    Consultants [place], RN  Imaging: No results found.  Procedures none  Hospital Course:  50 year-old morbidly obese female who presented to [facility] on 12/05/15 with fatigue, nausea, and watery, frequent stool into ostomy bag. Patient is s/p robot-assisted low anterior resection for clinical T3 N1 rectal cancer and proximal/mid rectum on 05/25/15. She developed an anastomotic leak with abscess requiring an ileostomy and JP drain (11/17/15).   Workup not significant for any further surgical complications and her findings were likely related to exposure to viral gastroenteritis from  her sick daughter at home. The patient was admitted to the med/surg floor for hydration, antiemetics, and observation. Diet was advanced as  tolerated. On hospital day 2 , the patient was voiding well, tolerating diet, pain well controlled, vital signs stable, ostomy pouch stable and felt stable for discharge home. The patient has been receiving IVF hydration 3x weekly at home and will continue this after discharge. Patient will follow up in our office in 3 weeks and knows to call with questions or concerns.  She will call to confirm appointment date/time.       Medication List    TAKE these medications        CoQ-10 100 MG Caps  Take 100 mg by mouth daily.     DULoxetine 60 MG capsule  Commonly known as:  CYMBALTA  Take 60 mg by mouth at bedtime.     esomeprazole 40 MG capsule  Commonly known as:  NEXIUM  Take 40 mg by mouth 2 (two) times daily as needed (heartburn/ acid reflux).     HYDROmorphone 4 MG tablet  Commonly known as:  DILAUDID  Take 1-2 tablets (4-8 mg total) by mouth every 6 (six) hours as needed for moderate pain or severe pain.     hyoscyamine 0.375 MG 12 hr tablet  Commonly known as:  LEVBID  Take 0.375 mg by mouth daily.     lidocaine-prilocaine cream  Commonly known as:  EMLA  Apply to affected area once     meloxicam 15 MG tablet  Commonly known as:  MOBIC  Take 15 mg by mouth daily as needed for pain.     methocarbamol 500 MG tablet  Commonly known as:  ROBAXIN  Take 2 tablets (1,000 mg total) by mouth every 6 (six) hours as needed for muscle spasms.     Normal Saline  Flush 0.9 % Soln  Place 5 mLs rectally daily as needed (For peg tube.).     ondansetron 8 MG tablet  Commonly known as:  ZOFRAN  Take 1 tablet (8 mg total) by mouth 2 (two) times daily. Start the day after chemo for 2 days. Then take as needed for nausea or vomiting.     promethazine 25 MG tablet  Commonly known as:  PHENERGAN  Take 25 mg by mouth every 8 (eight) hours as needed for nausea or vomiting.     psyllium 58.6 % powder  Commonly known as:  METAMUCIL  Take 1 packet by mouth 2 (two) times daily.     traMADol  50 MG tablet  Commonly known as:  ULTRAM  Take 50 mg by mouth 2 (two) times daily as needed for moderate pain. pain     Vitamin D (Ergocalciferol) 50000 units Caps capsule  Commonly known as:  DRISDOL  Take 50,000 Units by mouth 2 (two) times a week.        Follow-up Information    Follow up with [facility].   Why:  nurse for IVF   Contact information:   [address] [place] [address] [phone]       Follow up with [facility] On 01/09/2016.   Specialty:  General Surgery   Why:  appointment is at 10:45 am to follow up with Dr. Johney Maine after your previous surgery/hospital stay. Please arrive 30 minutes early to get checked in a fill out any necessary paperwork.   Contact information:   [address] [place] [phone]      Signed: Obie Dredge, [facility] 12/07/2015, 3:57 PM Pager: [phone] Mon-Fri 7:00 am-4:30 pm Sat-Sun 7:00 am-11:30 am

## 2015-12-07 NOTE — Progress Notes (Signed)
Discharge instructions discussed with patient and family, verbalized agreement and understanding, prescriptions given to patient

## 2015-12-08 ENCOUNTER — Encounter: Payer: Self-pay | Admitting: *Deleted

## 2015-12-08 NOTE — Progress Notes (Signed)
[place] Work  Clinical Social Work was referred by nurse for assessment of psychosocial needs due to possible financial concerns.  Clinical Social Worker contacted patient's father as directed. CSW introduced self, explained role of CSW, but also of HIPPA and that CSW could listen to his concerns, but not disclose medical info. He reports family is doing better and that he may "have over reacted" about financial concerns. CSW encouraged him to provide pt with CSW info if other needs arise.   Loren Racer, [place] Worker [place]  [place] Phone: [phone] Fax: [phone]

## 2015-12-19 ENCOUNTER — Encounter (HOSPITAL_COMMUNITY): Payer: Self-pay

## 2015-12-19 ENCOUNTER — Emergency Department (HOSPITAL_COMMUNITY): Payer: BLUE CROSS/BLUE SHIELD

## 2015-12-19 ENCOUNTER — Inpatient Hospital Stay (HOSPITAL_COMMUNITY)
Admission: EM | Admit: 2015-12-19 | Discharge: 2015-12-22 | DRG: 392 | Disposition: A | Discharge status: Home Health | Payer: BLUE CROSS/BLUE SHIELD | Attending: Surgery | Admitting: Surgery

## 2015-12-19 ENCOUNTER — Other Ambulatory Visit: Payer: Self-pay | Admitting: Surgery

## 2015-12-19 DIAGNOSIS — Z79891 Long term (current) use of opiate analgesic: Secondary | ICD-10-CM | POA: Diagnosis not present

## 2015-12-19 DIAGNOSIS — F329 Major depressive disorder, single episode, unspecified: Secondary | ICD-10-CM | POA: Diagnosis present

## 2015-12-19 DIAGNOSIS — T82898A Other specified complication of vascular prosthetic devices, implants and grafts, initial encounter: Secondary | ICD-10-CM | POA: Diagnosis present

## 2015-12-19 DIAGNOSIS — Z823 Family history of stroke: Secondary | ICD-10-CM

## 2015-12-19 DIAGNOSIS — K529 Noninfective gastroenteritis and colitis, unspecified: Secondary | ICD-10-CM | POA: Diagnosis present

## 2015-12-19 DIAGNOSIS — Z9109 Other allergy status, other than to drugs and biological substances: Secondary | ICD-10-CM | POA: Diagnosis not present

## 2015-12-19 DIAGNOSIS — Z79899 Other long term (current) drug therapy: Secondary | ICD-10-CM | POA: Diagnosis not present

## 2015-12-19 DIAGNOSIS — F419 Anxiety disorder, unspecified: Secondary | ICD-10-CM | POA: Diagnosis present

## 2015-12-19 DIAGNOSIS — Z833 Family history of diabetes mellitus: Secondary | ICD-10-CM

## 2015-12-19 DIAGNOSIS — R32 Unspecified urinary incontinence: Secondary | ICD-10-CM | POA: Diagnosis present

## 2015-12-19 DIAGNOSIS — Z6841 Body Mass Index (BMI) 40.0 and over, adult: Secondary | ICD-10-CM | POA: Diagnosis not present

## 2015-12-19 DIAGNOSIS — R109 Unspecified abdominal pain: Secondary | ICD-10-CM | POA: Diagnosis present

## 2015-12-19 DIAGNOSIS — Z885 Allergy status to narcotic agent status: Secondary | ICD-10-CM

## 2015-12-19 DIAGNOSIS — I10 Essential (primary) hypertension: Secondary | ICD-10-CM | POA: Diagnosis present

## 2015-12-19 DIAGNOSIS — E7800 Pure hypercholesterolemia, unspecified: Secondary | ICD-10-CM | POA: Diagnosis present

## 2015-12-19 DIAGNOSIS — I341 Nonrheumatic mitral (valve) prolapse: Secondary | ICD-10-CM | POA: Diagnosis present

## 2015-12-19 DIAGNOSIS — Z90710 Acquired absence of both cervix and uterus: Secondary | ICD-10-CM | POA: Diagnosis not present

## 2015-12-19 DIAGNOSIS — K589 Irritable bowel syndrome without diarrhea: Secondary | ICD-10-CM | POA: Diagnosis present

## 2015-12-19 DIAGNOSIS — E440 Moderate protein-calorie malnutrition: Secondary | ICD-10-CM | POA: Diagnosis present

## 2015-12-19 DIAGNOSIS — N739 Female pelvic inflammatory disease, unspecified: Secondary | ICD-10-CM | POA: Diagnosis present

## 2015-12-19 DIAGNOSIS — Z880 Allergy status to penicillin: Secondary | ICD-10-CM

## 2015-12-19 DIAGNOSIS — Z87440 Personal history of urinary (tract) infections: Secondary | ICD-10-CM

## 2015-12-19 DIAGNOSIS — Z8249 Family history of ischemic heart disease and other diseases of the circulatory system: Secondary | ICD-10-CM

## 2015-12-19 DIAGNOSIS — N1330 Unspecified hydronephrosis: Secondary | ICD-10-CM

## 2015-12-19 DIAGNOSIS — F32A Depression, unspecified: Secondary | ICD-10-CM | POA: Diagnosis present

## 2015-12-19 DIAGNOSIS — R1013 Epigastric pain: Secondary | ICD-10-CM

## 2015-12-19 DIAGNOSIS — G4730 Sleep apnea, unspecified: Secondary | ICD-10-CM | POA: Diagnosis present

## 2015-12-19 DIAGNOSIS — C20 Malignant neoplasm of rectum: Secondary | ICD-10-CM | POA: Diagnosis present

## 2015-12-19 DIAGNOSIS — Z932 Ileostomy status: Secondary | ICD-10-CM

## 2015-12-19 DIAGNOSIS — E876 Hypokalemia: Secondary | ICD-10-CM | POA: Diagnosis present

## 2015-12-19 DIAGNOSIS — Z808 Family history of malignant neoplasm of other organs or systems: Secondary | ICD-10-CM | POA: Diagnosis not present

## 2015-12-19 DIAGNOSIS — E559 Vitamin D deficiency, unspecified: Secondary | ICD-10-CM | POA: Diagnosis present

## 2015-12-19 DIAGNOSIS — E860 Dehydration: Secondary | ICD-10-CM | POA: Diagnosis present

## 2015-12-19 DIAGNOSIS — Z87891 Personal history of nicotine dependence: Secondary | ICD-10-CM | POA: Diagnosis not present

## 2015-12-19 DIAGNOSIS — T82594A Other mechanical complication of infusion catheter, initial encounter: Secondary | ICD-10-CM

## 2015-12-19 DIAGNOSIS — E66813 Obesity, class 3: Secondary | ICD-10-CM

## 2015-12-19 DIAGNOSIS — M179 Osteoarthritis of knee, unspecified: Secondary | ICD-10-CM | POA: Diagnosis present

## 2015-12-19 DIAGNOSIS — R1011 Right upper quadrant pain: Secondary | ICD-10-CM

## 2015-12-19 DIAGNOSIS — Z9221 Personal history of antineoplastic chemotherapy: Secondary | ICD-10-CM

## 2015-12-19 DIAGNOSIS — K9189 Other postprocedural complications and disorders of digestive system: Secondary | ICD-10-CM | POA: Diagnosis present

## 2015-12-19 DIAGNOSIS — K219 Gastro-esophageal reflux disease without esophagitis: Secondary | ICD-10-CM | POA: Diagnosis present

## 2015-12-19 DIAGNOSIS — Z923 Personal history of irradiation: Secondary | ICD-10-CM | POA: Diagnosis not present

## 2015-12-19 DIAGNOSIS — Y838 Other surgical procedures as the cause of abnormal reaction of the patient, or of later complication, without mention of misadventure at the time of the procedure: Secondary | ICD-10-CM | POA: Diagnosis present

## 2015-12-19 DIAGNOSIS — Z882 Allergy status to sulfonamides status: Secondary | ICD-10-CM | POA: Diagnosis not present

## 2015-12-19 DIAGNOSIS — Z884 Allergy status to anesthetic agent status: Secondary | ICD-10-CM

## 2015-12-19 DIAGNOSIS — R1010 Upper abdominal pain, unspecified: Secondary | ICD-10-CM | POA: Diagnosis present

## 2015-12-19 DIAGNOSIS — N134 Hydroureter: Secondary | ICD-10-CM

## 2015-12-19 LAB — URINALYSIS, ROUTINE W REFLEX MICROSCOPIC
BILIRUBIN URINE: NEGATIVE
GLUCOSE, UA: NEGATIVE mg/dL
Hgb urine dipstick: NEGATIVE
Ketones, ur: NEGATIVE mg/dL
NITRITE: NEGATIVE
PH: 6 (ref 5.0–8.0)
Protein, ur: NEGATIVE mg/dL
SPECIFIC GRAVITY, URINE: 1.006 (ref 1.005–1.030)

## 2015-12-19 LAB — CBC WITH DIFFERENTIAL/PLATELET
BASOS ABS: 0 10*3/uL (ref 0.0–0.1)
BASOS PCT: 0 %
EOS ABS: 0.2 10*3/uL (ref 0.0–0.7)
Eosinophils Relative: 2 %
HCT: 36.8 % (ref 36.0–46.0)
HEMOGLOBIN: 12.1 g/dL (ref 12.0–15.0)
LYMPHS ABS: 0.8 10*3/uL (ref 0.7–4.0)
Lymphocytes Relative: 6 %
MCH: 28.1 pg (ref 26.0–34.0)
MCHC: 32.9 g/dL (ref 30.0–36.0)
MCV: 85.4 fL (ref 78.0–100.0)
Monocytes Absolute: 0.8 10*3/uL (ref 0.1–1.0)
Monocytes Relative: 6 %
NEUTROS PCT: 86 %
Neutro Abs: 11.2 10*3/uL — ABNORMAL HIGH (ref 1.7–7.7)
Platelets: 452 10*3/uL — ABNORMAL HIGH (ref 150–400)
RBC: 4.31 MIL/uL (ref 3.87–5.11)
RDW: 14.4 % (ref 11.5–15.5)
WBC: 13 10*3/uL — AB (ref 4.0–10.5)

## 2015-12-19 LAB — COMPREHENSIVE METABOLIC PANEL
ALBUMIN: 3.1 g/dL — AB (ref 3.5–5.0)
ALK PHOS: 108 U/L (ref 38–126)
ALT: 12 U/L — AB (ref 14–54)
AST: 15 U/L (ref 15–41)
Anion gap: 11 (ref 5–15)
BUN: 7 mg/dL (ref 6–20)
CALCIUM: 9.5 mg/dL (ref 8.9–10.3)
CHLORIDE: 101 mmol/L (ref 101–111)
CO2: 25 mmol/L (ref 22–32)
CREATININE: 0.72 mg/dL (ref 0.44–1.00)
GFR calc Af Amer: >60 mL/min (ref >60)
GFR calc non Af Amer: >60 mL/min (ref >60)
GLUCOSE: 100 mg/dL — AB (ref 65–99)
Potassium: 3.1 mmol/L — ABNORMAL LOW (ref 3.5–5.1)
SODIUM: 137 mmol/L (ref 135–145)
Total Bilirubin: 0.6 mg/dL (ref 0.3–1.2)
Total Protein: 7.7 g/dL (ref 6.5–8.1)

## 2015-12-19 LAB — I-STAT CHEM 8, ED
BUN: 4 mg/dL — ABNORMAL LOW (ref 6–20)
CALCIUM ION: 1.15 mmol/L (ref 1.12–1.23)
CHLORIDE: 99 mmol/L — AB (ref 101–111)
Creatinine, Ser: 0.7 mg/dL (ref 0.44–1.00)
Glucose, Bld: 99 mg/dL (ref 65–99)
HCT: 38 % (ref 36.0–46.0)
Hemoglobin: 12.9 g/dL (ref 12.0–15.0)
Potassium: 3.1 mmol/L — ABNORMAL LOW (ref 3.5–5.1)
SODIUM: 138 mmol/L (ref 135–145)
TCO2: 24 mmol/L (ref 0–100)

## 2015-12-19 LAB — URINE MICROSCOPIC-ADD ON

## 2015-12-19 LAB — LIPASE, BLOOD: LIPASE: 18 U/L (ref 11–51)

## 2015-12-19 LAB — POC OCCULT BLOOD, ED: FECAL OCCULT BLD: NEGATIVE

## 2015-12-19 LAB — MAGNESIUM: Magnesium: 1.9 mg/dL (ref 1.7–2.4)

## 2015-12-19 MED ORDER — NORMAL SALINE FLUSH 0.9 % IV SOLN: 5.0000 mL | Freq: Every day | INTRAVENOUS | Status: DC | PRN

## 2015-12-19 MED ORDER — ONDANSETRON HCL 4 MG/2ML IJ SOLN
4.0000 mg | Freq: Four times a day (QID) | INTRAMUSCULAR | Status: DC | PRN
  Administered 2015-12-19 – 2015-12-20 (×2): 4 mg via INTRAVENOUS
  Filled 2015-12-19 (×2): qty 2

## 2015-12-19 MED ORDER — PANTOPRAZOLE SODIUM 40 MG PO TBEC
40.0000 mg | DELAYED_RELEASE_TABLET | Freq: Every day | ORAL | Status: DC
  Filled 2015-12-19: qty 1

## 2015-12-19 MED ORDER — HYDROMORPHONE HCL 2 MG/ML IJ SOLN
2.0000 mg | INTRAMUSCULAR | Status: DC | PRN
  Administered 2015-12-19 (×2): 2 mg via INTRAVENOUS
  Filled 2015-12-19 (×2): qty 1

## 2015-12-19 MED ORDER — SODIUM CHLORIDE 0.9 % IV BOLUS (SEPSIS)
1000.0000 mL | Freq: Once | INTRAVENOUS | Status: AC
  Administered 2015-12-19: 1000 mL via INTRAVENOUS

## 2015-12-19 MED ORDER — ONDANSETRON 4 MG PO TBDP: 4.0000 mg | ORAL_TABLET | Freq: Four times a day (QID) | ORAL | Status: DC | PRN

## 2015-12-19 MED ORDER — HYOSCYAMINE SULFATE ER 0.375 MG PO TB12
0.3750 mg | ORAL_TABLET | Freq: Every day | ORAL | Status: DC
  Administered 2015-12-20 – 2015-12-21 (×2): 0.375 mg via ORAL
  Filled 2015-12-19 (×3): qty 1

## 2015-12-19 MED ORDER — ONDANSETRON HCL 4 MG PO TABS: 8.0000 mg | ORAL_TABLET | Freq: Two times a day (BID) | ORAL | Status: DC | PRN

## 2015-12-19 MED ORDER — PSYLLIUM 95 % PO PACK
1.0000 | PACK | Freq: Every day | ORAL | Status: DC
  Administered 2015-12-21: 1 via ORAL
  Filled 2015-12-19 (×3): qty 1

## 2015-12-19 MED ORDER — POTASSIUM CHLORIDE IN NACL 20-0.9 MEQ/L-% IV SOLN
INTRAVENOUS | Status: DC
  Administered 2015-12-19 – 2015-12-20 (×2): 1000 mL via INTRAVENOUS
  Administered 2015-12-20: 20:00:00 via INTRAVENOUS
  Filled 2015-12-19 (×5): qty 1000

## 2015-12-19 MED ORDER — IOPAMIDOL (ISOVUE-300) INJECTION 61%
100.0000 mL | Freq: Once | INTRAVENOUS | Status: AC | PRN
  Administered 2015-12-19: 100 mL via INTRAVENOUS

## 2015-12-19 MED ORDER — METHOCARBAMOL 500 MG PO TABS: 500.0000 mg | ORAL_TABLET | Freq: Three times a day (TID) | ORAL | Status: DC | PRN

## 2015-12-19 MED ORDER — PROMETHAZINE HCL 25 MG PO TABS: 25.0000 mg | ORAL_TABLET | Freq: Three times a day (TID) | ORAL | Status: DC | PRN

## 2015-12-19 MED ORDER — POTASSIUM CHLORIDE CRYS ER 20 MEQ PO TBCR
40.0000 meq | EXTENDED_RELEASE_TABLET | Freq: Two times a day (BID) | ORAL | Status: DC
  Administered 2015-12-19 – 2015-12-21 (×4): 40 meq via ORAL
  Filled 2015-12-19 (×6): qty 2

## 2015-12-19 MED ORDER — ENOXAPARIN SODIUM 40 MG/0.4ML ~~LOC~~ SOLN
40.0000 mg | SUBCUTANEOUS | Status: DC
  Administered 2015-12-19 – 2015-12-21 (×3): 40 mg via SUBCUTANEOUS
  Filled 2015-12-19 (×4): qty 0.4

## 2015-12-19 MED ORDER — VITAMIN D (ERGOCALCIFEROL) 1.25 MG (50000 UNIT) PO CAPS: 50000.0000 [IU] | ORAL_CAPSULE | ORAL | Status: DC

## 2015-12-19 MED ORDER — HYDROMORPHONE HCL 1 MG/ML IJ SOLN
0.5000 mg | Freq: Once | INTRAMUSCULAR | Status: AC
  Administered 2015-12-19: 0.5 mg via INTRAVENOUS
  Filled 2015-12-19: qty 1

## 2015-12-19 MED ORDER — POTASSIUM CHLORIDE 10 MEQ/100ML IV SOLN
10.0000 meq | INTRAVENOUS | Status: AC
  Administered 2015-12-19 (×2): 10 meq via INTRAVENOUS
  Filled 2015-12-19 (×2): qty 100

## 2015-12-19 MED ORDER — HYDROMORPHONE HCL 2 MG PO TABS: 4.0000 mg | ORAL_TABLET | Freq: Four times a day (QID) | ORAL | Status: DC | PRN

## 2015-12-19 MED ORDER — DULOXETINE HCL 30 MG PO CPEP
60.0000 mg | ORAL_CAPSULE | Freq: Every day | ORAL | Status: DC
  Administered 2015-12-19 – 2015-12-20 (×2): 60 mg via ORAL
  Filled 2015-12-19: qty 1
  Filled 2015-12-19 (×2): qty 2
  Filled 2015-12-19: qty 1

## 2015-12-19 MED ORDER — TRAMADOL HCL 50 MG PO TABS: 50.0000 mg | ORAL_TABLET | Freq: Four times a day (QID) | ORAL | Status: DC | PRN

## 2015-12-19 MED ORDER — HYDROMORPHONE HCL 1 MG/ML IJ SOLN
1.0000 mg | INTRAMUSCULAR | Status: DC | PRN
  Administered 2015-12-19: 1 mg via INTRAVENOUS
  Administered 2015-12-20 (×3): 2 mg via INTRAVENOUS
  Filled 2015-12-19 (×4): qty 2

## 2015-12-19 MED ORDER — ONDANSETRON HCL 4 MG/2ML IJ SOLN
4.0000 mg | Freq: Once | INTRAMUSCULAR | Status: AC
  Administered 2015-12-19: 4 mg via INTRAVENOUS
  Filled 2015-12-19: qty 2

## 2015-12-19 MED ORDER — DIATRIZOATE MEGLUMINE & SODIUM 66-10 % PO SOLN
30.0000 mL | ORAL | Status: DC | PRN
  Administered 2015-12-19: 30 mL via ORAL

## 2015-12-19 NOTE — ED Provider Notes (Signed)
CSN: [id number]     Arrival date & time 12/19/15  1144 History   First MD Initiated Contact with Patient 12/19/15 1203     Chief Complaint  Patient presents with  . Abdominal Pain     (Consider location/radiation/quality/duration/timing/severity/associated sxs/prior Treatment) HPI   Blood pressure 126/95, pulse 121, temperature 98.4 F (36.9 C), temperature source Oral, resp. rate 18, SpO2 96 %.  Lindsay Little is a 50 y.o. female past medical history significant for T3 N1 rectal cancer requiring resection and colostomy complicated by anastomotic leak and abscess requiring ileostomy JP drain placement (surgeon: Gross) complaining of severe bilateral upper abdominal pain onset 2 days ago associated with increasing watery discharge from her colostomy 2 days ago now resolved into normal ostomy production. Patient took 2 Norco at home with some relief of her pain. She has a JP drain in place it's been putting out normal 10 mL daily she denies fever, chills, nausea, vomiting. She has normal urine output because she's been pushing fluids and trying to stay hydrated.   Past Medical History  Diagnosis Date  . Hypertension   . Hypercholesteremia   . Obesity   . Tear of left rotator cuff   . IBS (irritable bowel syndrome)   . Vertigo   . TMJ (dislocation of temporomandibular joint)   . Bulging disc     L5  . Depression   . Rectal bleeding   . Vitamin D deficiency   . Osteoarthritis, knee   . S/P radiation therapy 02/20/15-03/30/15    colon/rectal  . Complication of anesthesia   . PONV (postoperative nausea and vomiting)   . History of mitral valve prolapse   . Mild sleep apnea     does not use CPAP  . History of chemotherapy   . Numbness and tingling     great toe bilat left more than right   . History of frequent ear infections   . History of frequent urinary tract infections     middle school age  . GERD (gastroesophageal reflux disease)   . Cancer ([place])   . Anemia   . History  of blood transfusion   . Fibroids   . 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 Heart murmur   . Occasional tremors   . Wears glasses   . Anxiety   . History of hiatal hernia    Past Surgical History  Procedure Laterality Date  . Rotator cuff repair    . Eus N/A 01/18/2015    Procedure: LOWER ENDOSCOPIC ULTRASOUND (EUS);  Surgeon: Arta Silence, MD;  Location: [facility];  Service: Endoscopy;  Laterality: N/A;  . Abdominal hysterectomy  1996    Removed uterus, and tubes  . Left knee arthroscopy      mult  . Tonsillectomy    . Dilation and curettage of uterus      times 2  . Xi robotic assisted lower anterior resection N/A 05/25/2015    Procedure: XI ROBOTIC ASSISTED LOWER ANTERIOR RESECTION, , RIGID PROCTOSCOPY, RIGHT OOPHORECTOMY;  Surgeon: Michael Boston, MD;  Location: [facility];  Service: General;  Laterality: N/A;  . Portacath placement N/A 05/25/2015    Procedure: INSERTION PORT-A-CATH;  Surgeon: Michael Boston, MD;  Location: [facility];  Service: General;  Laterality: N/A;  . Jp drain     . Ileo loop colostomy closure N/A 11/17/2015    Procedure: LAPAROSCOPIC DIVERTING LOOP ILEOSTOMY  DRAINAGE OF PELVIC ABSCESS;  Surgeon: Michael Boston, MD;  Location: [facility];  Service: General;  Laterality: N/A;  . Impaction removal  11/17/2015    Procedure: DISIMPACTION REMOVAL;  Surgeon: Michael Boston, MD;  Location: [facility];  Service: General;;  . Excision of skin tag  11/17/2015    Procedure: EXCISION OF SKIN TAG;  Surgeon: Michael Boston, MD;  Location: [facility];  Service: General;;  . Laparoscopic lysis of adhesions  11/17/2015    Procedure: LAPAROSCOPIC LYSIS OF ADHESIONS;  Surgeon: Michael Boston, MD;  Location: [facility];  Service: General;;   Family History  Problem Relation Age of Onset  . Coronary artery disease Mother 81  . Hypertension Mother   . Hyperlipidemia Mother   . Diabetes Mellitus I Mother   . Coronary artery disease Father   . Hyperlipidemia Father   . Hypertension  Father   . Cancer Sister     skin - non melanoma  . Hyperlipidemia Brother   . Cancer Maternal Uncle 77    pancreatic with mets to colon and prostate  . Cirrhosis Maternal Uncle   . Hypertension Maternal Grandmother   . Diabetes Mellitus I Maternal Grandmother   . Hyperlipidemia Maternal Grandmother   . CVA Maternal Grandmother   . Hypertension Maternal Grandfather   . Coronary artery disease Maternal Grandfather 65  . Hyperlipidemia Maternal Grandfather   . Coronary artery disease Paternal Grandmother   . Hypertension Paternal Grandmother   . Hyperlipidemia Paternal Grandmother   . Diabetes Mellitus I Paternal Grandmother   . Hypertension Paternal Grandfather   . Hyperlipidemia Paternal Grandfather   . Coronary artery disease Paternal Grandfather    Social History  Substance Use Topics  . Smoking status: Former Smoker -- 0.50 packs/day for 7 years    Types: Cigarettes    Quit date: 07/16/1991  . Smokeless tobacco: Never Used     Comment: 1 pack/week intermittently x 7 years  . Alcohol Use: 0.6 oz/week    1 Shots of liquor per week     Comment: rarely    OB History    No data available     Review of Systems  10 systems reviewed and found to be negative, except as noted in the HPI.   Allergies  Caine-1; Adhesive; Iron; Oxycodone; Penicillins; and Sulfa antibiotics  Home Medications   Prior to Admission medications   Medication Sig Start Date End Date Taking? Authorizing Provider  Coenzyme Q10 (COQ-10) 100 MG CAPS Take 100 mg by mouth daily.   Yes Historical Provider, MD  DULoxetine (CYMBALTA) 60 MG capsule Take 60 mg by mouth at bedtime.    Yes Historical Provider, MD  esomeprazole (NEXIUM) 40 MG capsule Take 40 mg by mouth 2 (two) times daily as needed (heartburn/ acid reflux).    Yes Historical Provider, MD  HYDROcodone-acetaminophen (NORCO/VICODIN) 5-325 MG tablet Take 1-2 tablets by mouth every 6 (six) hours as needed for moderate pain or severe pain. 12/07/15   Yes Elizabeth S Simaan, PA-C  HYDROmorphone (DILAUDID) 4 MG tablet Take 1-2 tablets (4-8 mg total) by mouth every 6 (six) hours as needed for moderate pain or severe pain. 11/17/15  Yes Michael Boston, MD  hyoscyamine (LEVBID) 0.375 MG 12 hr tablet Take 0.375 mg by mouth daily.    Yes Historical Provider, MD  lidocaine-prilocaine (EMLA) cream Apply to affected area once Patient taking differently: Apply 1 application topically daily as needed (For port-a-cath.).  06/14/15  Yes Truitt Merle, MD  meloxicam (MOBIC) 15 MG tablet Take 15 mg by mouth daily as needed for pain.  Yes Historical Provider, MD  methocarbamol (ROBAXIN) 500 MG tablet Take 2 tablets (1,000 mg total) by mouth every 6 (six) hours as needed for muscle spasms. Patient taking differently: Take 500-1,000 mg by mouth every 8 (eight) hours as needed for muscle spasms.  09/17/15  Yes Michael Boston, MD  ondansetron (ZOFRAN) 8 MG tablet Take 1 tablet (8 mg total) by mouth 2 (two) times daily. Start the day after chemo for 2 days. Then take as needed for nausea or vomiting. Patient taking differently: Take 8 mg by mouth 2 (two) times daily as needed for nausea or vomiting. Start the day after chemo for 2 days. Then take as needed for nausea or vomiting. 11/23/15  Yes Michael Boston, MD  potassium chloride SA (K-DUR,KLOR-CON) 20 MEQ tablet Take 40 mEq by mouth 2 (two) times daily. 12/15/15  Yes Historical Provider, MD  promethazine (PHENERGAN) 25 MG tablet Take 25 mg by mouth every 8 (eight) hours as needed for nausea or vomiting.    Yes Historical Provider, MD  psyllium (METAMUCIL) 58.6 % powder Take 1 packet by mouth 2 (two) times daily.   Yes Historical Provider, MD  Sodium Chloride Flush (NORMAL SALINE FLUSH) 0.9 % SOLN Place 5 mLs rectally daily as needed (For peg tube.).  07/05/15  Yes Historical Provider, MD  traMADol (ULTRAM) 50 MG tablet Take 50 mg by mouth 2 (two) times daily as needed for moderate pain. pain 06/13/15  Yes Historical Provider, MD   Vitamin D, Ergocalciferol, (DRISDOL) 50000 UNITS CAPS capsule Take 50,000 Units by mouth 2 (two) times a week.   Yes Historical Provider, MD   BP 133/74 mmHg  Pulse 85  Temp(Src) 98.4 F (36.9 C) (Oral)  Resp 20  SpO2 96% Physical Exam  Constitutional: She is oriented to person, place, and time. She appears well-developed and well-nourished. No distress.  HENT:  Head: Normocephalic and atraumatic.  Mouth/Throat: Oropharynx is clear and moist.  Eyes: Conjunctivae and EOM are normal. Pupils are equal, round, and reactive to light.  Neck: Normal range of motion.  Cardiovascular: Normal rate, regular rhythm and intact distal pulses.   Pulmonary/Chest: Effort normal and breath sounds normal. No stridor. No respiratory distress. She has no wheezes. She has no rales. She exhibits no tenderness.  Abdominal: Soft. Bowel sounds are normal. She exhibits no distension and no mass. There is tenderness. There is no rebound and no guarding.  Colostomy in place to right upper quadrant with normal stool output, no skin breakdown. Patient has normoactive bowel sounds, significantly tender to palpation the epigastrium and left upper quadrant, JP drain is in place with purulent drainage  Genitourinary: No vaginal discharge found.  Musculoskeletal: Normal range of motion.  Neurological: She is alert and oriented to person, place, and time.  Psychiatric: She has a normal mood and affect.  Nursing note and vitals reviewed.   ED Course  Procedures (including critical care time) Labs Review Labs Reviewed  COMPREHENSIVE METABOLIC PANEL - Abnormal; Notable for the following:    Potassium 3.1 (*)    Glucose, Bld 100 (*)    Albumin 3.1 (*)    ALT 12 (*)    All other components within normal limits  URINALYSIS, ROUTINE W REFLEX MICROSCOPIC (NOT AT [facility]) - Abnormal; Notable for the following:    Leukocytes, UA MODERATE (*)    All other components within normal limits  CBC WITH DIFFERENTIAL/PLATELET -  Abnormal; Notable for the following:    WBC 13.0 (*)    Platelets 452 (*)  Neutro Abs 11.2 (*)    All other components within normal limits  URINE MICROSCOPIC-ADD ON - Abnormal; Notable for the following:    Squamous Epithelial / LPF 0-5 (*)    Bacteria, UA FEW (*)    All other components within normal limits  I-STAT CHEM 8, ED - Abnormal; Notable for the following:    Potassium 3.1 (*)    Chloride 99 (*)    BUN 4 (*)    All other components within normal limits  URINE CULTURE  LIPASE, BLOOD  MAGNESIUM  POC OCCULT BLOOD, ED    Imaging Review Ct Abdomen Pelvis W Contrast  12/19/2015  CLINICAL DATA:  Abdomen pain for 3 days. Patient has a colostomy with history of colon cancer. Currently undergoing chemotherapy. EXAM: CT ABDOMEN AND PELVIS WITH CONTRAST TECHNIQUE: Multidetector CT imaging of the abdomen and pelvis was performed using the standard protocol following bolus administration of intravenous contrast. CONTRAST:  17m ISOVUE-300 IOPAMIDOL (ISOVUE-300) INJECTION 61% COMPARISON:  Dec 05, 2015 FINDINGS: Lower chest: No acute findings. No focal pneumonia or pleural effusion is identified. The heart size normal. Hepatobiliary: There are stable liver lesions and low densities unchanged compared to prior CT. No new lesions are identified. The gallbladder is normal. Pancreas: No mass, inflammatory changes, or other significant abnormality. Spleen: Within normal limits in size and appearance. Adrenals/Urinary Tract: There is right hydronephrosis and hydroureter without focal discrete obstructing stone identified in the right collecting system. The left kidney is normal without hydronephrosis. The adrenal glands are normal. The bladder is normal. Stomach/Bowel: A right abdominal ostomy is identified. The previously noted thick walled bowel loops leading up to ostomy are decreased with interval improvement. There is no bowel obstruction. The previously noted pelvic abscess is smaller and her exam  measuring 7.1 x 4 cm. There pelvic drainage tube, the position is unchanged but there is increased inflammation surrounding the tube. Vascular/Lymphatic: No pathologically enlarged lymph nodes. No evidence of abdominal aortic aneurysm. Reproductive: No mass or other significant abnormality. Other:  Small amount of ascites is noted. Musculoskeletal: Degenerative joint changes of the spine are identified. There is anterior listhesis of L4-5. IMPRESSION: The previously noted abscess in the presacral region smaller compared to the prior CT. However, there is interval increase inflammation surrounding the pelvic drainage tube compared to the previous exam.There is right hydronephrosis and right hydroureter probably a 3 obstruction related a inflammation previously described above the in pelvis. Stable liver changes. Electronically Signed   By: WAbelardo DieselM.D.   On: 12/19/2015 16:37   I have personally reviewed and evaluated these images and lab results as part of my medical decision-making.   EKG Interpretation   Date/Time:  Tuesday December 19 2015 13:02:45 EDT Ventricular Rate:  104 PR Interval:  164 QRS Duration: 91 QT Interval:  371 QTC Calculation: 488 R Axis:   -51 Text Interpretation:  Sinus tachycardia Probable left atrial enlargement  Inferior infarct, old Anteroseptal infarct, age indeterminate Baseline  wander in lead(s) II aVR aVF Rate decreased compared to previous Confirmed  by NGUYEN, EMILY ([id number] on 12/19/2015 1:05:07 PM Also confirmed by NAlfonse Spruce  EMILY ([id number], editor [place] KJoelene Millin([phone]  on 12/19/2015 1:07:44 PM      MDM   Final diagnoses:  Hydronephrosis, unspecified hydronephrosis type  Epigastric pain  Hypokalemia  Ileostomy in place [place]    Filed Vitals:   12/19/15 1335 12/19/15 1400 12/19/15 1500 12/19/15 1601  BP: 121/87 136/92 133/74 133/74  Pulse: 107 85 88 85  Temp:    98.4 F (36.9 C)  TempSrc:    Oral  Resp: 18 22 19 20   SpO2: 96% 93% 92% 96%     Medications  diatrizoate meglumine-sodium (GASTROGRAFIN) 66-10 % solution 30 mL (30 mLs Oral Given 12/19/15 1341)  potassium chloride 10 mEq in 100 mL IVPB (10 mEq Intravenous New Bag/Given 12/19/15 1651)  HYDROmorphone (DILAUDID) injection 2 mg (2 mg Intravenous Given 12/19/15 1656)  sodium chloride 0.9 % bolus 1,000 mL (not administered)  sodium chloride 0.9 % bolus 1,000 mL (1,000 mLs Intravenous New Bag/Given 12/19/15 1256)  HYDROmorphone (DILAUDID) injection 0.5 mg (0.5 mg Intravenous Given 12/19/15 1455)  sodium chloride 0.9 % bolus 1,000 mL (0 mLs Intravenous Stopped 12/19/15 1652)  HYDROmorphone (DILAUDID) injection 0.5 mg (0.5 mg Intravenous Given 12/19/15 1552)  ondansetron (ZOFRAN) injection 4 mg (4 mg Intravenous Given 12/19/15 1553)  iopamidol (ISOVUE-300) 61 % injection 100 mL (100 mLs Intravenous Contrast Given 12/19/15 1612)    CORINDA AMMON is 50 y.o. female presenting with Resolved watery output from colostomy 2 days ago with severe upper abdominal pain , Appears acutely in pain, no real peritoneal signs however. She is tachycardic without fever. Will obtain basic blood work, urinalysis and CT abdomen pelvis then consult with surgery.  Potassium is 3.1, normal magnesium, patient is required multiple doses of Dilaudid for comfort in the ED.  CT shows the previously noted abscess improved, there is worsening inflammation surrounding the pelvic drainage tube. There is a right hydronephrosis and hydroureter, no stone  Discussed case with Dr. Excell Seltzer who will evaluate.     Monico Blitz, PA-C 12/19/15 1729  Harvel Quale, MD 12/25/15 2322

## 2015-12-19 NOTE — ED Notes (Signed)
Patient c/o mid abdominal pain x 3 days. Patient has a colostomy and states the stool has been watery,but today the stool is a little thicker that is coming out. Patient states her mid abdominal pain feels like she has been doing crunches.

## 2015-12-19 NOTE — H&P (Signed)
Lindsay Little is an 50 y.o. female.    Chief Complaint: Abdominal pain  HPI: Patient is s/p robot-assisted low anterior resection for clinical T3 N1 rectal cancer and proximal/mid rectum on 05/25/15. She developed an anastomotic leak with abscess requiring an ileostomy and JP drain (11/17/15). She was subsequently readmitted on approximately May 22 and discharged on May 25 after an episode of diarrhea and abdominal pain that resolved with observation and IV hydration and was felt questionably secondary to a viral illness. She had been doing well until 3 days ago when she initially again developed  Copious watery ileostomy output. No particular abdominal pain at that time. She was seen in our office only about 4 days ago doing well. She now has 2 days of progressive worsening epigastric pain. She describes  Constant aching pain in the mid epigastrium without radiation.She has some nausea which has been chronic throughout her illness and not worse with this pain. The pain is worse with motion. She has no urinary symptoms. She has been drinking fluids and eating some no change in her symptoms related to this. She has occasionally felt a little chilled but no documented fever. She called our office today and  Was instructed to go to the emergency department for further evaluation.  Past Medical History  Diagnosis Date  . Hypertension   . Hypercholesteremia   . Obesity   . Tear of left rotator cuff   . IBS (irritable bowel syndrome)   . Vertigo   . TMJ (dislocation of temporomandibular joint)   . Bulging disc     L5  . Depression   . Rectal bleeding   . Vitamin D deficiency   . Osteoarthritis, knee   . S/P radiation therapy 02/20/15-03/30/15    colon/rectal  . Complication of anesthesia   . PONV (postoperative nausea and vomiting)   . History of mitral valve prolapse   . Mild sleep apnea     does not use CPAP  . History of chemotherapy   . Numbness and tingling     great toe bilat left more  than right   . History of frequent ear infections   . History of frequent urinary tract infections     middle school age  . GERD (gastroesophageal reflux disease)   . Cancer ([place])   . Anemia   . History of blood transfusion   . Fibroids   . 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 Heart murmur   . Occasional tremors   . Wears glasses   . Anxiety   . History of hiatal hernia     Past Surgical History  Procedure Laterality Date  . Rotator cuff repair    . Eus N/A 01/18/2015    Procedure: LOWER ENDOSCOPIC ULTRASOUND (EUS);  Surgeon: Arta Silence, MD;  Location: [facility];  Service: Endoscopy;  Laterality: N/A;  . Abdominal hysterectomy  1996    Removed uterus, and tubes  . Left knee arthroscopy      mult  . Tonsillectomy    . Dilation and curettage of uterus      times 2  . Xi robotic assisted lower anterior resection N/A 05/25/2015    Procedure: XI ROBOTIC ASSISTED LOWER ANTERIOR RESECTION, , RIGID PROCTOSCOPY, RIGHT OOPHORECTOMY;  Surgeon: Michael Boston, MD;  Location: [facility];  Service: General;  Laterality: N/A;  . Portacath placement N/A 05/25/2015    Procedure: INSERTION PORT-A-CATH;  Surgeon: Michael Boston, MD;  Location: [facility];  Service:  General;  Laterality: N/A;  . Jp drain     . Ileo loop colostomy closure N/A 11/17/2015    Procedure: LAPAROSCOPIC DIVERTING LOOP ILEOSTOMY  DRAINAGE OF PELVIC ABSCESS;  Surgeon: Michael Boston, MD;  Location: [facility];  Service: General;  Laterality: N/A;  . Impaction removal  11/17/2015    Procedure: DISIMPACTION REMOVAL;  Surgeon: Michael Boston, MD;  Location: [facility];  Service: General;;  . Excision of skin tag  11/17/2015    Procedure: EXCISION OF SKIN TAG;  Surgeon: Michael Boston, MD;  Location: [facility];  Service: General;;  . Laparoscopic lysis of adhesions  11/17/2015    Procedure: LAPAROSCOPIC LYSIS OF ADHESIONS;  Surgeon: Michael Boston, MD;  Location: [facility];  Service: General;;    Family History  Problem Relation Age of  Onset  . Coronary artery disease Mother 26  . Hypertension Mother   . Hyperlipidemia Mother   . Diabetes Mellitus I Mother   . Coronary artery disease Father   . Hyperlipidemia Father   . Hypertension Father   . Cancer Sister     skin - non melanoma  . Hyperlipidemia Brother   . Cancer Maternal Uncle 57    pancreatic with mets to colon and prostate  . Cirrhosis Maternal Uncle   . Hypertension Maternal Grandmother   . Diabetes Mellitus I Maternal Grandmother   . Hyperlipidemia Maternal Grandmother   . CVA Maternal Grandmother   . Hypertension Maternal Grandfather   . Coronary artery disease Maternal Grandfather 65  . Hyperlipidemia Maternal Grandfather   . Coronary artery disease Paternal Grandmother   . Hypertension Paternal Grandmother   . Hyperlipidemia Paternal Grandmother   . Diabetes Mellitus I Paternal Grandmother   . Hypertension Paternal Grandfather   . Hyperlipidemia Paternal Grandfather   . Coronary artery disease Paternal Grandfather    Social History:  reports that she quit smoking about 24 years ago. Her smoking use included Cigarettes. She has a 3.5 pack-year smoking history. She has never used smokeless tobacco. She reports that she drinks about 0.6 oz of alcohol per week. She reports that she does not use illicit drugs.  Allergies:  Allergies  Allergen Reactions  . Caine-1 [Lidocaine] Rash    Eyes swell shut; includes all caine drugs except marcaine. EMLA cream OK though (?!)  . Adhesive [Tape] Other (See Comments)    Blisters - can use paper tape  . Iron Nausea Only  . Oxycodone     NIGHTMARES. (tolerates hydrocodone or tramadol better)  . Penicillins Rash    Has patient had a PCN reaction causing immediate rash, facial/tongue/throat swelling, SOB or lightheadedness with hypotension: no Has patient had a PCN reaction causing severe rash involving mucus membranes or skin necrosis: no Has patient had a PCN reaction that required hospitalization no Has  patient had a PCN reaction occurring within the last 10 years: no If all of the above answers are "NO", then may proceed with Cephalosporin use.   . Sulfa Antibiotics Rash    Rash & Vomiting   Current Facility-Administered Medications  Medication Dose Route Frequency Provider Last Rate Last Dose  . diatrizoate meglumine-sodium (GASTROGRAFIN) 66-10 % solution 30 mL  30 mL Oral PRN Harvel Quale, MD   30 mL at 12/19/15 1341  . HYDROmorphone (DILAUDID) injection 2 mg  2 mg Intravenous Q15 min PRN Monico Blitz, PA-C   2 mg at 12/19/15 1755  . potassium chloride 10 mEq in 100 mL IVPB  10 mEq Intravenous Q1 Hr  x 2 Nicole Pisciotta, PA-C 100 mL/hr at 12/19/15 1755 10 mEq at 12/19/15 1755  . sodium chloride 0.9 % bolus 1,000 mL  1,000 mL Intravenous Once [employer], PA-C 1,000 mL/hr at 12/19/15 1756 1,000 mL at 12/19/15 1756   Current Outpatient Prescriptions  Medication Sig Dispense Refill  . Coenzyme Q10 (COQ-10) 100 MG CAPS Take 100 mg by mouth daily.    . DULoxetine (CYMBALTA) 60 MG capsule Take 60 mg by mouth at bedtime.     Marland Kitchen esomeprazole (NEXIUM) 40 MG capsule Take 40 mg by mouth 2 (two) times daily as needed (heartburn/ acid reflux).     Marland Kitchen HYDROcodone-acetaminophen (NORCO/VICODIN) 5-325 MG tablet Take 1-2 tablets by mouth every 6 (six) hours as needed for moderate pain or severe pain. 40 tablet 0  . HYDROmorphone (DILAUDID) 4 MG tablet Take 1-2 tablets (4-8 mg total) by mouth every 6 (six) hours as needed for moderate pain or severe pain. 40 tablet 0  . hyoscyamine (LEVBID) 0.375 MG 12 hr tablet Take 0.375 mg by mouth daily.     Marland Kitchen lidocaine-prilocaine (EMLA) cream Apply to affected area once (Patient taking differently: Apply 1 application topically daily as needed (For port-a-cath.). ) 30 g 3  . meloxicam (MOBIC) 15 MG tablet Take 15 mg by mouth daily as needed for pain.     . methocarbamol (ROBAXIN) 500 MG tablet Take 2 tablets (1,000 mg total) by mouth every 6 (six) hours as  needed for muscle spasms. (Patient taking differently: Take 500-1,000 mg by mouth every 8 (eight) hours as needed for muscle spasms. ) 20 tablet 3  . ondansetron (ZOFRAN) 8 MG tablet Take 1 tablet (8 mg total) by mouth 2 (two) times daily. Start the day after chemo for 2 days. Then take as needed for nausea or vomiting. (Patient taking differently: Take 8 mg by mouth 2 (two) times daily as needed for nausea or vomiting. Start the day after chemo for 2 days. Then take as needed for nausea or vomiting.) 10 tablet 3  . potassium chloride SA (K-DUR,KLOR-CON) 20 MEQ tablet Take 40 mEq by mouth 2 (two) times daily.  1  . promethazine (PHENERGAN) 25 MG tablet Take 25 mg by mouth every 8 (eight) hours as needed for nausea or vomiting.     . psyllium (METAMUCIL) 58.6 % powder Take 1 packet by mouth 2 (two) times daily.    . Sodium Chloride Flush (NORMAL SALINE FLUSH) 0.9 % SOLN Place 5 mLs rectally daily as needed (For peg tube.).   0  . traMADol (ULTRAM) 50 MG tablet Take 50 mg by mouth 2 (two) times daily as needed for moderate pain. pain  2  . Vitamin D, Ergocalciferol, (DRISDOL) 50000 UNITS CAPS capsule Take 50,000 Units by mouth 2 (two) times a week.       Results for orders placed or performed during the hospital encounter of 12/19/15 (from the past 48 hour(s))  Magnesium     Status: None   Collection Time: 12/19/15 12:57 PM  Result Value Ref Range   Magnesium 1.9 1.7 - 2.4 mg/dL  Lipase, blood     Status: None   Collection Time: 12/19/15  1:00 PM  Result Value Ref Range   Lipase 18 11 - 51 U/L  Comprehensive metabolic panel     Status: Abnormal   Collection Time: 12/19/15  1:00 PM  Result Value Ref Range   Sodium 137 135 - 145 mmol/L   Potassium 3.1 (L) 3.5 - 5.1 mmol/L  Chloride 101 101 - 111 mmol/L   CO2 25 22 - 32 mmol/L   Glucose, Bld 100 (H) 65 - 99 mg/dL   BUN 7 6 - 20 mg/dL   Creatinine, Ser 0.72 0.44 - 1.00 mg/dL   Calcium 9.5 8.9 - 10.3 mg/dL   Total Protein 7.7 6.5 - 8.1 g/dL    Albumin 3.1 (L) 3.5 - 5.0 g/dL   AST 15 15 - 41 U/L   ALT 12 (L) 14 - 54 U/L   Alkaline Phosphatase 108 38 - 126 U/L   Total Bilirubin 0.6 0.3 - 1.2 mg/dL   GFR calc non Af Amer >60 >60 mL/min   GFR calc Af Amer >60 >60 mL/min    Comment: (NOTE) The eGFR has been calculated using the CKD EPI equation. This calculation has not been validated in all clinical situations. eGFR's persistently <60 mL/min signify possible Chronic Kidney Disease.    Anion gap 11 5 - 15  I-Stat Chem 8, ED     Status: Abnormal   Collection Time: 12/19/15  1:18 PM  Result Value Ref Range   Sodium 138 135 - 145 mmol/L   Potassium 3.1 (L) 3.5 - 5.1 mmol/L   Chloride 99 (L) 101 - 111 mmol/L   BUN 4 (L) 6 - 20 mg/dL   Creatinine, Ser 0.70 0.44 - 1.00 mg/dL   Glucose, Bld 99 65 - 99 mg/dL   Calcium, Ion 1.15 1.12 - 1.23 mmol/L   TCO2 24 0 - 100 mmol/L   Hemoglobin 12.9 12.0 - 15.0 g/dL   HCT 38.0 36.0 - 46.0 %  CBC with Differential     Status: Abnormal   Collection Time: 12/19/15  2:51 PM  Result Value Ref Range   WBC 13.0 (H) 4.0 - 10.5 K/uL   RBC 4.31 3.87 - 5.11 MIL/uL   Hemoglobin 12.1 12.0 - 15.0 g/dL   HCT 36.8 36.0 - 46.0 %   MCV 85.4 78.0 - 100.0 fL   MCH 28.1 26.0 - 34.0 pg   MCHC 32.9 30.0 - 36.0 g/dL   RDW 14.4 11.5 - 15.5 %   Platelets 452 (H) 150 - 400 K/uL   Neutrophils Relative % 86 %   Neutro Abs 11.2 (H) 1.7 - 7.7 K/uL   Lymphocytes Relative 6 %   Lymphs Abs 0.8 0.7 - 4.0 K/uL   Monocytes Relative 6 %   Monocytes Absolute 0.8 0.1 - 1.0 K/uL   Eosinophils Relative 2 %   Eosinophils Absolute 0.2 0.0 - 0.7 K/uL   Basophils Relative 0 %   Basophils Absolute 0.0 0.0 - 0.1 K/uL  Urinalysis, Routine w reflex microscopic     Status: Abnormal   Collection Time: 12/19/15  4:52 PM  Result Value Ref Range   Color, Urine YELLOW YELLOW   APPearance CLEAR CLEAR   Specific Gravity, Urine 1.006 1.005 - 1.030   pH 6.0 5.0 - 8.0   Glucose, UA NEGATIVE NEGATIVE mg/dL   Hgb urine dipstick  NEGATIVE NEGATIVE   Bilirubin Urine NEGATIVE NEGATIVE   Ketones, ur NEGATIVE NEGATIVE mg/dL   Protein, ur NEGATIVE NEGATIVE mg/dL   Nitrite NEGATIVE NEGATIVE   Leukocytes, UA MODERATE (A) NEGATIVE  Urine microscopic-add on     Status: Abnormal   Collection Time: 12/19/15  4:52 PM  Result Value Ref Range   Squamous Epithelial / LPF 0-5 (A) NONE SEEN   WBC, UA 0-5 0 - 5 WBC/hpf   RBC / HPF 6-30 0 - 5 RBC/hpf  Bacteria, UA FEW (A) NONE SEEN  POC occult blood, ED RN will collect     Status: None   Collection Time: 12/19/15  5:02 PM  Result Value Ref Range   Fecal Occult Bld NEGATIVE NEGATIVE   Ct Abdomen Pelvis W Contrast  12/19/2015  CLINICAL DATA:  Abdomen pain for 3 days. Patient has a colostomy with history of colon cancer. Currently undergoing chemotherapy. EXAM: CT ABDOMEN AND PELVIS WITH CONTRAST TECHNIQUE: Multidetector CT imaging of the abdomen and pelvis was performed using the standard protocol following bolus administration of intravenous contrast. CONTRAST:  194m ISOVUE-300 IOPAMIDOL (ISOVUE-300) INJECTION 61% COMPARISON:  Dec 05, 2015 FINDINGS: Lower chest: No acute findings. No focal pneumonia or pleural effusion is identified. The heart size normal. Hepatobiliary: There are stable liver lesions and low densities unchanged compared to prior CT. No new lesions are identified. The gallbladder is normal. Pancreas: No mass, inflammatory changes, or other significant abnormality. Spleen: Within normal limits in size and appearance. Adrenals/Urinary Tract: There is right hydronephrosis and hydroureter without focal discrete obstructing stone identified in the right collecting system. The left kidney is normal without hydronephrosis. The adrenal glands are normal. The bladder is normal. Stomach/Bowel: A right abdominal ostomy is identified. The previously noted thick walled bowel loops leading up to ostomy are decreased with interval improvement. There is no bowel obstruction. The previously  noted pelvic abscess is smaller and her exam measuring 7.1 x 4 cm. There pelvic drainage tube, the position is unchanged but there is increased inflammation surrounding the tube. Vascular/Lymphatic: No pathologically enlarged lymph nodes. No evidence of abdominal aortic aneurysm. Reproductive: No mass or other significant abnormality. Other:  Small amount of ascites is noted. Musculoskeletal: Degenerative joint changes of the spine are identified. There is anterior listhesis of L4-5. IMPRESSION: The previously noted abscess in the presacral region smaller compared to the prior CT. However, there is interval increase inflammation surrounding the pelvic drainage tube compared to the previous exam.There is right hydronephrosis and right hydroureter probably a 3 obstruction related a inflammation previously described above the in pelvis. Stable liver changes. Electronically Signed   By: WAbelardo DieselM.D.   On: 12/19/2015 16:37    Review of Systems  Constitutional: Positive for chills. Negative for fever, malaise/fatigue and diaphoresis.  Respiratory: Negative.   Cardiovascular: Negative.   Gastrointestinal: Positive for nausea, abdominal pain and diarrhea. Negative for vomiting.  Genitourinary: Negative.     Blood pressure 138/78, pulse 104, temperature 98.4 F (36.9 C), temperature source Oral, resp. rate 20, SpO2 97 %. Physical Exam  General: Morbidly obese Caucasian female in no acute distress Skin: Warm and dry, no rash or infection HEENT: Sclerae nonicteric. Mucous membranes moist. No palpable masses. Lungs: Clear equal breath sounds without wheezing or increased work of breathing Cardiac: Regular rate and rhythm. No apparent edema or JVD. Abdomen: Ileostomy and right mid abdomen.  Healed laparoscopic incisions. Drain in place left abdomen with thin bloody drainage.  There is localized tenderness in the epigastrium with some guarding but no peritoneal signs. Remainder of her abdomen is  relatively nontender. Extremities: No edema  Or calf tenderness Neurologic: She is alert and fully oriented. Affect appropriate. No motor deficits.   Assessment/Plan Somewhat complex patient postoperative from a low anterior resection for locally advanced rectal cancer with delayed anastomotic leak, status post diverting ileostomy and drain placement. Now presents with 2 days of acute  Epigastric abdominal pain with no real associated symptoms.CT scan shows improvement in her pelvic inflammatory changes  and the only new finding is a right hydroureter. Gallbladder appears normal on CT scan. Her symptoms do not seem entirely typical for  Renal colic but I suppose are  Consistent.  She is having significant pain all be admitted for pain control. We will ask urology to see in regards to her hydroureter. Check gallbladder ultrasound.  Edward Jolly, MD 12/19/2015, 6:23 PM

## 2015-12-19 NOTE — Progress Notes (Signed)
[facility]  Lindsay Little is an active pt with [facility] currently.   [facility] is providing IV hydration fluids at home for the pt. [facility] is providing a HHRN and our home infusion pharmacy team to support her hydration therapy. [place] team will follow Lindsay Little while here in the ED to support her transition home.   If patient discharges after hours, please call [phone].   Larry Sierras 12/19/2015, 2:25 PM

## 2015-12-19 NOTE — H&P (Signed)
Clinic visit 12/15/2015:      Lindsay Little 12/15/2015 3:50 PM Location: [facility] Patient #: [id number] DOB: 06/20/1966 Married / Language: Lindsay Little / Race: White Female   History of Present Illness [name] MD; 12/15/2015 5:04 PM) The patient is a 50 year old female who presents with colorectal cancer. Note for "Colorectal cancer": The patient is a 50 year old female who presents with colorectal cancernote for "Colorectal cancer":  The patient is a 50 year old female who presents with colorectal cancer. The patient returns status post robotically-assisted low anterior resection for clinical T3 N1 rectal cancer and proximal/mid rectum. 05/25/2015. Downstaged [id number] (0/12 LN)  Patient struggled with postoperative diarrhea. Was really bothersome. It eventually got under better control. Appetite down but improving. Then started to feel worse. CT scan showed evidence of abscess cavity near the anastomosis strongly suspicious for leak. Admitted. IV antibiotics. Percutaneous drain placed 06/24/2015. Suspicious for control of anastomotic leak. Anemic. Got another dose of IV iron.  She comes today with her husband. She is feeling better. She denies any pelvic pain now. Flushes down to once a day since more frequent flushes caused irritation. Annoyed by the exit site and the smell from the drain. Usually mildly feculent output is 15-25 mL a day. No more diarrhea. 1- 2 soft well-formed bowel movements a day. Appetite better. Getting close to back to regular activity. Saw medical oncology. Holding off on post adjuvant therapy given the long recovery period and chemotherapy window closing.  She developed a persistent pelvic fluid collection. Anastomotic breakdown. Underwent diverting loop ileostomy 5/52017. Lysis of adhesions. Omental flap to help plug up the pelvis and help the anastomotic leaks seal. Biopsy of chronic abscess cavity. Pathology benign.  Removal of transgluteal drain and placement of intra-abdominal drain. She had a niece that had gastroenteritis. She bounced back with gastroenterologist. Nausea and vomiting. Dehydrated. Admitted. Hydrated. Rapidly improved. Discharged 48 hours later. Had sent her home with IV fluid therapy to avoid dehydration. Port-A-Cath site got swollen and bruised. I cannot get it to work very well. We held off. Patient is drinking liquids and feeling fine. No fevers or chills. She feels much better now. She still has some transanal drainage but is markedly gone down. Pelvic pain is nearly gone. Abdominal drain has thin brown output. 10-20 milliliters a day max. She is emptying the ileostomy 3-4 times a day. Not needing to use antidiarrheals much. Bag is staying on a not falling off. Appetite and usually gradually coming back. Intentionally lost 15 pounds. Feeling more optimistic today.   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  Irritated acrochordon right lower quadrant panniculus  PROCEDURE:   LAPAROSCOPIC lysis of adhesions 60 minutes DIVERTING LOOP ILEOSTOMY  DRAINAGE OF PELVIC ABSCESS OMENTAL PEDICLE FLAP TO DEEP PELVIS EXAM UNDER ANESTHESIA WITH DISIMPACTION  EXCISION OF SKIN TAG  Surgeon(s): Michael Boston, MD  OR FINDINGS: Anastomotic dehiscence 50% circumference right anterior to left posterior lateral. Large body of stool and pelvis. Pigtail drain easily palpated.  Some friability of abscess wall but no hard nodules suspicious for cancer recurrence. Biopsy also nonetheless. Been seen inflamed loop of distal ileum adherent and partially walling off the abscess cavity. Part of serosa wall excised and sent to rule out cancer recurrence.  New pelvic drain comes in transabdominally and exits out the left upper quadrant. Goes down the pelvis. Large omental pedicle flap plugging down the right  pelvis and trying to keep  small bowel away.  Diverting loop ileostomy and high rub upper quadrant subcostal region. Proximal loop at 12:00. Distal loop inferior at 6:00.  No frank evidence of intraperitoneal carcinomatosis or cancer recurrence aside from fibrotic pelvic abscess wall. Biopsies done as noted above  Diagnosis 1. Soft tissue, biopsy, abdominal abscess cavity FIBROMUSCULAR TISSUE WITH INFLAMMATION AND REACTIVE CHANGES NO EVIDENCE OF MALIGNANCY 2. Soft tissue, biopsy, distal ileal wall SMALL BOWEL WITH CHRONIC INFLAMMATION AND HEMORRHAGE CONGESTION NEGATIVE FOR MALIGNANCY 3. Skin, tag INTRA MUSCULAR LIPOMA Casimer Lanius MD Pathologist, Electronic Signature (Case signed 11/21/2015)       SURGERY: 05/25/2015  POST-OPERATIVE DIAGNOSIS:   Proximal Rectal Cancer  XI ROBOTIC ASSISTED RECTOSIGMOID LOWER ANTERIOR RESECTION Laparoscopic and robotic lysis of adhesions X 60 MINUTES RIGID PROCTOSCOPY RIGHT SALPINGO-OOPHORECTOMY INSERTION PORT-A-CATH in R IJ  SURGEON:   Surgeon(s): Michael Boston, MD Leighton Ruff, MD  OR FINDINGS:  Patient had a bulky tumor in proximal and mid rectum. Most of it focused on the right anterolateral circumference. Still came down to around 9 cm from the anal verge.  I enlarged right ovary was some firm and mainly cystic areas. At least 6 cm in largest dimension. Left side looked atrophic and normal. Right ovary removed.  PowerPort is a ClearVUE 8 [place]. Reference #[id number] Lot number [id number] It is in the right internal jugular vein. The tip sits at the right atrial/superior vena caval junction  No obvious metastatic disease on visceral nor parietal peritoneum or liver.  The anastomosis rests 5-6 cm from the anal verge by rigid proctoscopy.    Diagnosis 1. Colon, segmental resection for tumor, recto-sigmoid INVASIVE COLONIC ADENOCARCINOMA (0.2 CM IN GREATEST DIMENSION RESIDUAL TUMOR) THE TUMOR INVADE SUBMUCOSA (0.3 CM  IN DEPTH, PT1) MARGINS OF RESECTION ARE NEGATIVE FOR TUMOR TWELVE BENIGN LYMPH NODES (0/12) 2. Colon, resection margin (donut), proximal anastomotic ring BENIGN COLONIC TISSUE, NEGATIVE FOR MALIGANCY 3. Colon, resection margin (donut), distal anastomotic ring BENIGN COLONIC TISSUE, NEGATIVE FOR MALIGNANCY 4. Ovary, right BENIGN SEROUS CYSTADENOMA Microscopic Comment 1. COLON AND RECTUM (INCLUDING TRANS-ANAL RESECTION): Specimen: Colon-rectum Procedure: Segmental resection Tumor site: Rectum anterior wall mid to distal third of rectum Specimen integrity: intact Macroscopic intactness of mesorectum: Complete: _ Macroscopic tumor perforation: Negative Invasive tumor: Maximum size: Histologic type(s): G2 Histologic grade and differentiation: G1: well differentiated/low grade G2: moderately differentiated/low grade G3: poorly differentiated/high grade G4: undifferentiated/high grade Type of polyp in which invasive carcinoma arose: _ 1 of 3 Duplicate copy FINAL for BATHSHEBA, DURRETT N [phone]) Microscopic Comment(continued) Microscopic extension of invasive tumor:Submucosa Lymph-Vascular invasion: Not identified Peri-neural invasion: negative Tumor deposit(s) (discontinuous extramural extension): Negative Resection margins: Proximal margin: Negative Distal margin: Negative Circumferential (radial) (posterior ascending, posterior descending; lateral and posterior mid-rectum; and entire lower 1/3 rectum):Negatvie Mesenteric margin (sigmoid and transverse): Negative Distance closest margin (if all above margins negative): 2.5 cm Treatment effect (neo-adjuvant therapy): Present Additional polyp(s): Negative Non-neoplastic findings: Unremarkable Lymph nodes: number examined 12; number positive: 0 Pathologic Staging: T1, N0, M_ Ancillary studies: Preserved expression of the makor and minor MMR proteins MLH1, MSH2, MSH6 and PMS2. MLH1: Casimer Lanius MD Pathologist, Electronic  Signature (Case signed 05/31/2015) Specimen Panda Crossin and Clinical Information Specimen(s) Obtained: 1. Colon, segmental resection for tumor, recto-sigmoid 2. Colon, resection margin (donut), proximal anastomotic ring 3. Colon, resection margin (donut), distal anastomotic ring 4. Ovary, right Specimen Clinical Information 1. proximal rectal cancer [rd] Ellanie Oppedisano 1. Specimen: Rectosigmoid, to include at least portion of distal 1/3 of rectum. Specimen integrity: Intact Specimen length: 38 cm.  11 cm from sigmoid rectal junction to distal margin, and 4 cm from peritoneal reflection to distal margin. Mesorectal intactness: Complete Tumor location: Anterior wall, middle third to distal third of rectum Tumor size: There is a 4 cm in length and 2.2 cm in diameter area of healing ulceration with underlying indurated tan white dense tissue up to 1.1 cm thick. A discrete mass is not identified. Percent of bowel circumference involved: 40% Tumor distance to margins: Proximal: 28 cm Distal: 6 cm Radial (posterior ascending, posterior descending; lateral and posterior mid-rectum; and entire lower 1/3 rectum): The area of dense indurated tissue is 2.5 cm from the perirectal soft tissue margin. 2 of 3 Duplicate copy FINAL for DANYAH, GUASTELLA [id number]) Jennie Hannay(continued) Macroscopic extent of tumor invasion: No definite mass to evaluate invasion Total presumed lymph nodes: Found are seventeen possible lymph nodes ranging from 0.4 to 1.3 cm Extramural satellite tumor nodules: None Mucosal polyp(s): None Additional findings: None Block summary: A = proximal margin B = distal margin C-F = sections of ulceration and underlying indurated tissue G-J = ulceration and underlying indurated tissue, with nearest serosa/peritoneal reflection. K = perirectal soft tissue margin nearest indurated tissue L = four possible nodes, whole M = four possible nodes, whole N = four possible nodes, whole O = four  possible node, whole P = one node bisected Total = sixteen blocks. 2. Received in formalin is a 2 cm in diameter and 1 cm thick ring of tan to hyperemic smooth mucosa with underlying tissue with embedded suture material. Representative sections are submitted in one block. 3. Received in formalin is a 1.4 cm in diameter and up to 1 cm thick ring of tan to hyperemic smooth mucosa and underlying tissue with embedded metallic staples. Representative sections submitted in one block. 4. Received in formalin is a 5.1 x 4.4 x 3.4 cm ovary, clinically right, which has a tan pink to dark red exterior with scattered adhesions. There are also portions of attached tissue consistent with fimbria, however definite tube is not identified. On sectioning, there are several serous and mucoid filled cysts which are up to 4 cm in greatest dimension, and have smooth inner surfaces with no areas of excrescences. Tissue adjacent to the largest cyst is focally tan white, firm and dense. Representative sections are submitted in five blocks. (SSW:kh 05-26-15) Report signed out from the following location(s) Technical component and interpretation was performed at [email or web address], [place], [place] [address]. CLIA #: [id number], 3 of 3   Problem List/Past Medical [name], MD; 12/15/2015 3:52 PM) RECTAL ADENOCARCINOMA (C20) INCISIONAL HERNIA, INCARCERATED (K43.0) URINARY FREQUENCY (R35.0) LARGE BOWEL ANASTOMOTIC LEAK (K91.89) ENCOUNTER FOR PREOPERATIVE EXAMINATION FOR GENERAL SURGICAL PROCEDURE (O03.559)  Other Problems [name], MD; 12/15/2015 3:52 PM) Back Pain Diverticulosis Gastroesophageal Reflux Disease General anesthesia - complications High blood pressure Hypercholesterolemia Other disease, cancer, significant illness Rectal Cancer Transfusion history  Past Surgical History [name], MD; 12/15/2015 3:52 PM) Colon Polyp Removal -  Colonoscopy Hysterectomy (not due to cancer) - Partial Knee Surgery Left. Oral Surgery Shoulder Surgery Left.  Diagnostic Studies History [name], MD; 12/15/2015 3:52 PM) Colonoscopy >10 years ago Mammogram 1-3 years ago Pap Smear 1-5 years ago  Allergies Elbert Ewings, CMA; 12/15/2015 3:50 PM) Lidocaine *CHEMICALS* All of the Caine's except MARCAINE is ok per the pt Penicillin G Benzathine & Proc *PENICILLINS* Sulfabenzamide *CHEMICALS*  Medication History Elbert Ewings, CMA; 12/15/2015 3:52 PM) MetroNIDAZOLE (500MG Tablet, 1 (one) Tablet  Oral bid, Taken starting 10/31/2015) Active. HYDROmorphone HCl (4MG Tablet, 1/2 -2 Tablet Oral every four hours, as needed, Taken starting 10/31/2015) Active. DULoxetine HCl (60MG Capsule DR Part, Oral) Active. Esomeprazole Magnesium (40MG Capsule DR, Oral) Active. Furosemide (20MG Tablet, Oral) Active. Hydrochlorothiazide (25MG Tablet, Oral) Active. Hyoscyamine Sulfate ER (0.375MG Tablet ER 12HR, Oral) Active. Meloxicam (15MG Tablet, Oral) Active. Crestor (5MG Tablet, Oral) Active. Bystolic (03KV Tablet, Oral) Active. Coenzyme Q10 (100MG Capsule, Oral) Active. Donnatal (16.2MG Tablet, Oral) Active. Robaxin (500MG Tablet, Oral) Active. Vitamin D (1000UNIT Tablet, Oral) Active. Clotrimazole (10MG Lozenge, Mouth/Throat) Active. Remeron (15MG Tablet, Oral at bedtime) Active. Medications Reconciled  Pregnancy / Birth History [name], MD; 12/15/2015 3:52 PM) Age at menarche 70 years. Age of menopause 21-50 Gravida 4 Irregular periods Maternal age 58-20 Para 0  Vitals Elbert Ewings CMA; 12/15/2015 3:51 PM) 12/15/2015 3:50 PM Weight: 264 lb Height: 70.5in Body Surface Area: 2.36 m Body Mass Index: 37.34 kg/m  Temp.: 97.109F(Temporal)  Pulse: 80 (Regular)  BP: 132/88 (Sitting, Left Arm, Standard)       Physical Exam [name] MD; 12/15/2015 4:11 PM) General Mental  Status-Alert. General Appearance-Not in acute distress. Voice-Normal. Note: Relaxed. Nontoxic. Not anxious nor stressed today.    Integumentary Global Assessment Normal Exam - Distribution of scalp and body hair is normal. General Characteristics Overall Skin Surface - no rashes and no suspicious lesions.  Head and Neck Head-normocephalic, atraumatic with no lesions or palpable masses. Face Global Assessment - atraumatic, no absence of expression. Neck Global Assessment - no abnormal movements, no decreased range of motion. Trachea-midline. Thyroid Gland Characteristics - non-tender.  Eye Eyeball - Left-Extraocular movements intact, No Nystagmus. Eyeball - Right-Extraocular movements intact, No Nystagmus. Upper Eyelid - Left-No Cyanotic. Upper Eyelid - Right-No Cyanotic.  Chest and Lung Exam Inspection Accessory muscles - No use of accessory muscles in breathing.  Abdomen Note: Incisions with normal healing ridges. No guarding/rebound tenderness. Really no pain. No hernias. No drainage.   Female Genitourinary Note: No vaginal bleeding or discharge. Posterior rectovaginal septum intact without any fullness or induration. No obvious fistula. No mass. No ulceration.   Rectal Note: Drain with thinly feculent material. Again output 15-65m a day. Leaking at the stop-cock, especially with flushes. Not severe. Sensitive at exit site but no evidence of cellulitis.   Peripheral Vascular Upper Extremity Inspection - Left - Not Gangrenous, No Petechiae. Right - Not Gangrenous, No Petechiae.  Neurologic Neurologic evaluation reveals -normal attention span and ability to concentrate, able to name objects and repeat phrases. Appropriate fund of knowledge and normal coordination.  Neuropsychiatric Mental status exam performed with findings of-able to articulate well with normal speech/language, rate, volume and coherence and no evidence of  hallucinations, delusions, obsessions or homicidal/suicidal ideation. Orientation-oriented X3. Note: Less anxious / tearful. Depressed & restless   Musculoskeletal Global Assessment Gait and Station - normal gait and station.  Lymphatic General Lymphatics Description - No Generalized lymphadenopathy.    Assessment & Plan (Adin HectorMD; 12/15/2015 4:10 PM) RECTAL ADENOCARCINOMA (C20) Impression: Complicated recovery due to late abscess and contained anastomotic leak with some help of percutaneous drainage and antibiotics.  She needs fecal diversion in possible anastomotic revision or better fistula unroofing to help solve this problem.  Continue fiber bowel regimen. I strongly recommend she try Imodium again to control diarrhea.  Proctitis probably returned. Place on antibiotics.  She would benefit from post-adjuvant chemotherapy but obviously this must be delayed until the fistula is closed. Sounds like  they're observing only at this point given the prolonged period between surgery and healing. Would wait about a month from fistula closure before starting chemotherapy. 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 Not close despite percutaneous drainage and oral antibiotics and improved nutrition.  It has failed percutaneous drainage and nonoperative management.  Controlling diarrhea better with Imodium.  Antibiotics for proctitis. Cipro/Flagyl. Continue until surgery  Set up for fecal diversion. Loop ileostomy. Get preoperative marking. She has ostomy and other instruction materials.  TEM evaluation of anastomosis. Trying primarily closure versus better unroofing to posterior cavity/fistula. See if I can switch the transgluteal perc drain to something transabdominal. While I'm a little wary of trying to go through  the posterior rectum at this point from the peritoneal side, she would better tolerate a transabdominal drain as opposed to these transgluteal drains which cause her a lot of discomfort and agony. Maybe I could tunnel and through the gluteus bringing out the groin.  I'm glad that she is doing better and able to maintain being on the outside. I'm sorry that she was not able to keep her job in the fired her while she is trying to recover from cancer surgery. Hopefully they can get in a better place. I tried to stress to her that she deserves a better quality of life than she has right now. I want to help her. Current Plans Follow up with [place] in the office in 1 month.  Call us sooner as needed.  ILEOSTOMY IN PLACE (Z93.2) Current Plans Pt Education - [facility] Ostomy HCI (Yuvonne Lanahan): discussed with patient and provided information. Pt Education - [facility] Good Bowel Health (Danine Hor) HYPOKALEMIA (E87.6) Impression: Potassium 2.8. Started on potassium supplementation. No hypomagnesemia. Diuretic control. Should improve.  Recheck potassium and creatinine next week. Current Plans Started Potassium Chloride ER 20MEQ, 2 (two) Tablet two times daily, #20, 5 days starting 12/15/2015, Ref. x1. THROMBOSIS DUE TO CENTRAL VENOUS ACCESS DEVICE, INITIAL ENCOUNTER (I19.471G) Impression: Possible thrombosis of portacatheter. Some inflammation last week but totally resolved. Now better. .  Doubt there is a major issue at this time. Hold off on any aggressive flushes. Would benefit from tPA. Abdomen trying to see if home health will be willing to do it.  Try and hold off on infusions.

## 2015-12-19 NOTE — ED Notes (Signed)
Lab called, lost CBC -  Recollect sent down.

## 2015-12-19 NOTE — ED Notes (Signed)
Patient transported to CT 

## 2015-12-19 NOTE — ED Notes (Signed)
Lab called about recollect on blood.

## 2015-12-19 NOTE — Progress Notes (Signed)
[facility] ED CM notified Santiago Glad, Advanced coordinator of pt pending admission to be followed for d/c needs

## 2015-12-20 ENCOUNTER — Inpatient Hospital Stay (HOSPITAL_COMMUNITY): Payer: BLUE CROSS/BLUE SHIELD

## 2015-12-20 ENCOUNTER — Other Ambulatory Visit: Payer: Self-pay | Admitting: Surgery

## 2015-12-20 DIAGNOSIS — K529 Noninfective gastroenteritis and colitis, unspecified: Secondary | ICD-10-CM

## 2015-12-20 DIAGNOSIS — N134 Hydroureter: Secondary | ICD-10-CM

## 2015-12-20 DIAGNOSIS — T82594A Other mechanical complication of infusion catheter, initial encounter: Secondary | ICD-10-CM

## 2015-12-20 HISTORY — DX: Noninfective gastroenteritis and colitis, unspecified: K52.9

## 2015-12-20 LAB — BASIC METABOLIC PANEL
Anion gap: 8 (ref 5–15)
BUN: 6 mg/dL (ref 6–20)
CO2: 25 mmol/L (ref 22–32)
Calcium: 9.1 mg/dL (ref 8.9–10.3)
Chloride: 104 mmol/L (ref 101–111)
Creatinine, Ser: 0.71 mg/dL (ref 0.44–1.00)
GFR calc Af Amer: >60 mL/min (ref >60)
GFR calc non Af Amer: >60 mL/min (ref >60)
Glucose, Bld: 106 mg/dL — ABNORMAL HIGH (ref 65–99)
Potassium: 4.2 mmol/L (ref 3.5–5.1)
Sodium: 137 mmol/L (ref 135–145)

## 2015-12-20 LAB — CBC
HCT: 33 % — ABNORMAL LOW (ref 36.0–46.0)
Hemoglobin: 10.7 g/dL — ABNORMAL LOW (ref 12.0–15.0)
MCH: 27.3 pg (ref 26.0–34.0)
MCHC: 32.4 g/dL (ref 30.0–36.0)
MCV: 84.2 fL (ref 78.0–100.0)
Platelets: 395 10*3/uL (ref 150–400)
RBC: 3.92 MIL/uL (ref 3.87–5.11)
RDW: 14.7 % (ref 11.5–15.5)
WBC: 10.1 10*3/uL (ref 4.0–10.5)

## 2015-12-20 LAB — DIFFERENTIAL
BASOS ABS: 0 10*3/uL (ref 0.0–0.1)
BASOS PCT: 0 %
Eosinophils Absolute: 0.2 10*3/uL (ref 0.0–0.7)
Eosinophils Relative: 2 %
Lymphocytes Relative: 8 %
Lymphs Abs: 0.8 10*3/uL (ref 0.7–4.0)
MONOS PCT: 8 %
Monocytes Absolute: 0.8 10*3/uL (ref 0.1–1.0)
NEUTROS ABS: 8.6 10*3/uL — AB (ref 1.7–7.7)
Neutrophils Relative %: 82 %

## 2015-12-20 LAB — HEPATIC FUNCTION PANEL
ALT: 9 U/L — ABNORMAL LOW (ref 14–54)
AST: 13 U/L — AB (ref 15–41)
Albumin: 2.7 g/dL — ABNORMAL LOW (ref 3.5–5.0)
Alkaline Phosphatase: 99 U/L (ref 38–126)
BILIRUBIN DIRECT: 0.2 mg/dL (ref 0.1–0.5)
BILIRUBIN INDIRECT: 0.4 mg/dL (ref 0.3–0.9)
BILIRUBIN TOTAL: 0.6 mg/dL (ref 0.3–1.2)
Total Protein: 7 g/dL (ref 6.5–8.1)

## 2015-12-20 LAB — URINE CULTURE

## 2015-12-20 LAB — MAGNESIUM: MAGNESIUM: 2.1 mg/dL (ref 1.7–2.4)

## 2015-12-20 MED ORDER — SODIUM CHLORIDE 0.9 % IV BOLUS (SEPSIS): 2000.0000 mL | Freq: Once | INTRAVENOUS | Status: DC

## 2015-12-20 MED ORDER — HYDROMORPHONE HCL 1 MG/ML IJ SOLN
0.5000 mg | INTRAMUSCULAR | Status: DC | PRN
  Administered 2015-12-20 – 2015-12-21 (×3): 1 mg via INTRAVENOUS
  Filled 2015-12-20 (×3): qty 1

## 2015-12-20 MED ORDER — ENOXAPARIN SODIUM 40 MG/0.4ML ~~LOC~~ SOLN: 40.0000 mg | SUBCUTANEOUS | Status: DC

## 2015-12-20 MED ORDER — ACETAMINOPHEN 650 MG RE SUPP: 650.0000 mg | Freq: Four times a day (QID) | RECTAL | Status: DC | PRN

## 2015-12-20 MED ORDER — ONDANSETRON 4 MG PO TBDP: 4.0000 mg | ORAL_TABLET | Freq: Four times a day (QID) | ORAL | Status: DC | PRN

## 2015-12-20 MED ORDER — PROMETHAZINE HCL 25 MG/ML IJ SOLN
6.2500 mg | INTRAMUSCULAR | Status: DC | PRN
  Administered 2015-12-20: 12.5 mg via INTRAVENOUS
  Filled 2015-12-20 (×2): qty 1

## 2015-12-20 MED ORDER — SODIUM CHLORIDE 0.9% FLUSH: 10.0000 mL | INTRAVENOUS | Status: DC | PRN

## 2015-12-20 MED ORDER — CIPROFLOXACIN HCL 500 MG PO TABS
500.0000 mg | ORAL_TABLET | Freq: Two times a day (BID) | ORAL | Status: DC
  Administered 2015-12-20 – 2015-12-22 (×5): 500 mg via ORAL
  Filled 2015-12-20 (×9): qty 1

## 2015-12-20 MED ORDER — METRONIDAZOLE IN NACL 5-0.79 MG/ML-% IV SOLN
500.0000 mg | Freq: Four times a day (QID) | INTRAVENOUS | Status: DC
  Administered 2015-12-20 – 2015-12-21 (×4): 500 mg via INTRAVENOUS
  Filled 2015-12-20 (×4): qty 100

## 2015-12-20 MED ORDER — ONDANSETRON HCL 4 MG/2ML IJ SOLN
4.0000 mg | Freq: Four times a day (QID) | INTRAMUSCULAR | Status: DC | PRN
  Administered 2015-12-20: 4 mg via INTRAVENOUS
  Filled 2015-12-20: qty 2

## 2015-12-20 MED ORDER — DIPHENHYDRAMINE HCL 50 MG/ML IJ SOLN: 12.5000 mg | Freq: Four times a day (QID) | INTRAMUSCULAR | Status: DC | PRN

## 2015-12-20 MED ORDER — PANTOPRAZOLE SODIUM 40 MG PO TBEC
80.0000 mg | DELAYED_RELEASE_TABLET | Freq: Every day | ORAL | Status: DC
  Administered 2015-12-20 – 2015-12-21 (×2): 80 mg via ORAL
  Filled 2015-12-20: qty 2

## 2015-12-20 MED ORDER — SODIUM CHLORIDE 0.9 % IV BOLUS (SEPSIS)
2000.0000 mL | Freq: Once | INTRAVENOUS | Status: AC
  Administered 2015-12-20: 2000 mL via INTRAVENOUS

## 2015-12-20 MED ORDER — ACETAMINOPHEN 325 MG PO TABS
650.0000 mg | ORAL_TABLET | Freq: Four times a day (QID) | ORAL | Status: DC | PRN
  Administered 2015-12-20: 650 mg via ORAL
  Filled 2015-12-20: qty 2

## 2015-12-20 MED ORDER — DIPHENHYDRAMINE HCL 12.5 MG/5ML PO ELIX: 12.5000 mg | ORAL_SOLUTION | Freq: Four times a day (QID) | ORAL | Status: DC | PRN

## 2015-12-20 MED ORDER — LACTATED RINGERS IV SOLN: INTRAVENOUS | Status: DC

## 2015-12-20 MED ORDER — PROCHLORPERAZINE EDISYLATE 5 MG/ML IJ SOLN
5.0000 mg | INTRAMUSCULAR | Status: DC | PRN
  Administered 2015-12-20: 5 mg via INTRAVENOUS
  Administered 2015-12-21: 10 mg via INTRAVENOUS
  Filled 2015-12-20 (×2): qty 2

## 2015-12-20 MED ORDER — HYDRALAZINE HCL 20 MG/ML IJ SOLN: 10.0000 mg | INTRAMUSCULAR | Status: DC | PRN

## 2015-12-20 MED ORDER — LORAZEPAM 2 MG/ML IJ SOLN: 0.5000 mg | Freq: Three times a day (TID) | INTRAMUSCULAR | Status: DC | PRN

## 2015-12-20 NOTE — Consult Note (Signed)
Urology Consult   Physician requesting consult: Michael Boston  Reason for consult: Right hydroureter  History of Present Illness: Lindsay Little is a 50 y.o. female with PMH significant for HTN, IBS, depression, and rectal cancer who presented to the ED yesterday for eval of abdominal pain.  She underwent radiation and chemotherapy followed by a robot assisted LAR for T3 N1 rectal CA 05/2015.  She then developed an anastomotic leak with abscess that required an ileostomy and JP drain 11/2015. Approx 3 days ago she developed persistent epigastric pain that progressed in intensity prompting her to present to the ED.  She states the pain did not radiate but was worse with movement.  CT A/P with IV contrast revealed a decrease in size of the previously noted pelvic abscess and right hydroureteronephrosis without focal discrete obstructing stone.  Cr 0.71, WBC 10.  UA clear but urine culture was sent and is pending.   Pt states she has only had pain in the epigastric region.  She denies F/C, HA, CP, SOB, flank pain, back pain, voiding or storage urinary symptoms, hematuria, and urolithiasis.  She does have some incontinence but this has been present since her LAR.    Review of CT A/P from 09/2015 shows no right hydroureteronephrosis; however, CT from 11/2015 shows some distention of the right renal pelvis and proximal mild hydroureter.  There may be a slight increase in dilation on CT from 12/19/15 when compared to the previous study.    Past Medical History  Diagnosis Date  . Hypertension   . Hypercholesteremia   . Obesity   . Tear of left rotator cuff   . IBS (irritable bowel syndrome)   . Vertigo   . TMJ (dislocation of temporomandibular joint)   . Bulging disc     L5  . Depression   . Rectal bleeding   . Vitamin D deficiency   . Osteoarthritis, knee   . S/P radiation therapy 02/20/15-03/30/15    colon/rectal  . Complication of anesthesia   . PONV (postoperative nausea and vomiting)   .  History of mitral valve prolapse   . Mild sleep apnea     does not use CPAP  . History of chemotherapy   . Numbness and tingling     great toe bilat left more than right   . History of frequent ear infections   . History of frequent urinary tract infections     middle school age  . GERD (gastroesophageal reflux disease)   . Cancer ([place])   . Anemia   . History of blood transfusion   . Fibroids   . 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 Heart murmur   . Occasional tremors   . Wears glasses   . Anxiety   . History of hiatal hernia     Past Surgical History  Procedure Laterality Date  . Rotator cuff repair    . Eus N/A 01/18/2015    Procedure: LOWER ENDOSCOPIC ULTRASOUND (EUS);  Surgeon: Arta Silence, MD;  Location: [facility];  Service: Endoscopy;  Laterality: N/A;  . Abdominal hysterectomy  1996    Removed uterus, and tubes  . Left knee arthroscopy      mult  . Tonsillectomy    . Dilation and curettage of uterus      times 2  . Xi robotic assisted lower anterior resection N/A 05/25/2015    Procedure: XI ROBOTIC ASSISTED LOWER ANTERIOR RESECTION, , RIGID PROCTOSCOPY, RIGHT OOPHORECTOMY;  Surgeon: Remo Lipps  Gross, MD;  Location: [facility];  Service: General;  Laterality: N/A;  . Portacath placement N/A 05/25/2015    Procedure: INSERTION PORT-A-CATH;  Surgeon: Michael Boston, MD;  Location: [facility];  Service: General;  Laterality: N/A;  . Jp drain     . Ileo loop colostomy closure N/A 11/17/2015    Procedure: LAPAROSCOPIC DIVERTING LOOP ILEOSTOMY  DRAINAGE OF PELVIC ABSCESS;  Surgeon: Michael Boston, MD;  Location: [facility];  Service: General;  Laterality: N/A;  . Impaction removal  11/17/2015    Procedure: DISIMPACTION REMOVAL;  Surgeon: Michael Boston, MD;  Location: [facility];  Service: General;;  . Excision of skin tag  11/17/2015    Procedure: EXCISION OF SKIN TAG;  Surgeon: Michael Boston, MD;  Location: [facility];  Service: General;;  . Laparoscopic lysis of adhesions   11/17/2015    Procedure: LAPAROSCOPIC LYSIS OF ADHESIONS;  Surgeon: Michael Boston, MD;  Location: [facility];  Service: General;;     Current Hospital Medications:  Home meds:    Medication List    ASK your doctor about these medications        CoQ-10 100 MG Caps  Take 100 mg by mouth daily.     DULoxetine 60 MG capsule  Commonly known as:  CYMBALTA  Take 60 mg by mouth at bedtime.     esomeprazole 40 MG capsule  Commonly known as:  NEXIUM  Take 40 mg by mouth 2 (two) times daily as needed (heartburn/ acid reflux).     HYDROcodone-acetaminophen 5-325 MG tablet  Commonly known as:  NORCO/VICODIN  Take 1-2 tablets by mouth every 6 (six) hours as needed for moderate pain or severe pain.     HYDROmorphone 4 MG tablet  Commonly known as:  DILAUDID  Take 1-2 tablets (4-8 mg total) by mouth every 6 (six) hours as needed for moderate pain or severe pain.     hyoscyamine 0.375 MG 12 hr tablet  Commonly known as:  LEVBID  Take 0.375 mg by mouth daily.     lidocaine-prilocaine cream  Commonly known as:  EMLA  Apply to affected area once     meloxicam 15 MG tablet  Commonly known as:  MOBIC  Take 15 mg by mouth daily as needed for pain.     methocarbamol 500 MG tablet  Commonly known as:  ROBAXIN  Take 2 tablets (1,000 mg total) by mouth every 6 (six) hours as needed for muscle spasms.     Normal Saline Flush 0.9 % Soln  Place 5 mLs rectally daily as needed (For peg tube.).     ondansetron 8 MG tablet  Commonly known as:  ZOFRAN  Take 1 tablet (8 mg total) by mouth 2 (two) times daily. Start the day after chemo for 2 days. Then take as needed for nausea or vomiting.     potassium chloride SA 20 MEQ tablet  Commonly known as:  K-DUR,KLOR-CON  Take 40 mEq by mouth 2 (two) times daily.     promethazine 25 MG tablet  Commonly known as:  PHENERGAN  Take 25 mg by mouth every 8 (eight) hours as needed for nausea or vomiting.     psyllium 58.6 % powder  Commonly known as:   METAMUCIL  Take 1 packet by mouth 2 (two) times daily.     traMADol 50 MG tablet  Commonly known as:  ULTRAM  Take 50 mg by mouth 2 (two) times daily as needed for moderate pain. pain  Vitamin D (Ergocalciferol) 50000 units Caps capsule  Commonly known as:  DRISDOL  Take 50,000 Units by mouth 2 (two) times a week.        Scheduled Meds: . ciprofloxacin  500 mg Oral BID  . DULoxetine  60 mg Oral QHS  . enoxaparin (LOVENOX) injection  40 mg Subcutaneous Q24H  . hyoscyamine  0.375 mg Oral Daily  . metronidazole  500 mg Intravenous Q6H  . pantoprazole  80 mg Oral Daily  . potassium chloride SA  40 mEq Oral BID  . psyllium  1 packet Oral Daily  . sodium chloride  2,000 mL Intravenous Once  . sodium chloride  2,000 mL Intravenous Once  . [START ON 12/23/2015] Vitamin D (Ergocalciferol)  50,000 Units Oral Once per day on Tue Sat   Continuous Infusions: . 0.9 % NaCl with KCl 20 mEq / L 1,000 mL (12/20/15 0730)   PRN Meds:.acetaminophen **OR** acetaminophen, diphenhydrAMINE **OR** [DISCONTINUED] diphenhydrAMINE, diphenhydrAMINE, hydrALAZINE, HYDROmorphone (DILAUDID) injection, HYDROmorphone, LORazepam, methocarbamol, ondansetron **OR** ondansetron (ZOFRAN) IV, prochlorperazine, promethazine, promethazine, sodium chloride flush, traMADol  Allergies:  Allergies  Allergen Reactions  . Caine-1 [Lidocaine] Rash    Eyes swell shut; includes all caine drugs except marcaine. EMLA cream OK though (?!)  . Adhesive [Tape] Other (See Comments)    Blisters - can use paper tape  . Iron Nausea Only  . Oxycodone     NIGHTMARES. (tolerates hydrocodone or tramadol better)  . Penicillins Rash    Has patient had a PCN reaction causing immediate rash, facial/tongue/throat swelling, SOB or lightheadedness with hypotension: no Has patient had a PCN reaction causing severe rash involving mucus membranes or skin necrosis: no Has patient had a PCN reaction that required hospitalization no Has patient  had a PCN reaction occurring within the last 10 years: no If all of the above answers are "NO", then may proceed with Cephalosporin use.   . Sulfa Antibiotics Rash    Rash & Vomiting    Family History  Problem Relation Age of Onset  . Coronary artery disease Mother 68  . Hypertension Mother   . Hyperlipidemia Mother   . Diabetes Mellitus I Mother   . Coronary artery disease Father   . Hyperlipidemia Father   . Hypertension Father   . Cancer Sister     skin - non melanoma  . Hyperlipidemia Brother   . Cancer Maternal Uncle 20    pancreatic with mets to colon and prostate  . Cirrhosis Maternal Uncle   . Hypertension Maternal Grandmother   . Diabetes Mellitus I Maternal Grandmother   . Hyperlipidemia Maternal Grandmother   . CVA Maternal Grandmother   . Hypertension Maternal Grandfather   . Coronary artery disease Maternal Grandfather 65  . Hyperlipidemia Maternal Grandfather   . Coronary artery disease Paternal Grandmother   . Hypertension Paternal Grandmother   . Hyperlipidemia Paternal Grandmother   . Diabetes Mellitus I Paternal Grandmother   . Hypertension Paternal Grandfather   . Hyperlipidemia Paternal Grandfather   . Coronary artery disease Paternal Grandfather     Social History:  reports that she quit smoking about 24 years ago. Her smoking use included Cigarettes. She has a 3.5 pack-year smoking history. She has never used smokeless tobacco. She reports that she drinks about 0.6 oz of alcohol per week. She reports that she does not use illicit drugs.  ROS: A complete review of systems was performed.  All systems are negative except for pertinent findings as noted.  Physical Exam:  Vital signs in last 24 hours: Temp:  [98 F (36.7 C)-98.6 F (37 C)] 98 F (36.7 C) (06/07 1000) Pulse Rate:  [62-113] 90 (06/07 1000) Resp:  [16-22] 18 (06/07 1000) BP: (121-144)/(65-92) 123/72 mmHg (06/07 1000) SpO2:  [92 %-98 %] 98 % (06/07 1000) Weight:  [118.842 kg (262 lb)]  118.842 kg (262 lb) (06/06 2100) Constitutional:  Alert and oriented, No acute distress Cardiovascular: Regular rate and rhythm Respiratory: Normal respiratory effort GI: Abdomen is soft, mild epigastric tenderness; functioning Ileostomy; well healed incisions GU: No CVA tenderness Lymphatic: No lymphadenopathy Neurologic: Grossly intact, no focal deficits Psychiatric: Normal mood and affect  Laboratory Data:   Recent Labs  12/19/15 1318 12/19/15 1451 12/20/15 0436  WBC  --  13.0* 10.1  HGB 12.9 12.1 10.7*  HCT 38.0 36.8 33.0*  PLT  --  452* 395     Recent Labs  12/19/15 1300 12/19/15 1318 12/20/15 0436  NA 137 138 137  K 3.1* 3.1* 4.2  CL 101 99* 104  GLUCOSE 100* 99 106*  BUN 7 4* 6  CALCIUM 9.5  --  9.1  CREATININE 0.72 0.70 0.71     Results for orders placed or performed during the hospital encounter of 12/19/15 (from the past 24 hour(s))  Magnesium     Status: None   Collection Time: 12/19/15 12:57 PM  Result Value Ref Range   Magnesium 1.9 1.7 - 2.4 mg/dL  Lipase, blood     Status: None   Collection Time: 12/19/15  1:00 PM  Result Value Ref Range   Lipase 18 11 - 51 U/L  Comprehensive metabolic panel     Status: Abnormal   Collection Time: 12/19/15  1:00 PM  Result Value Ref Range   Sodium 137 135 - 145 mmol/L   Potassium 3.1 (L) 3.5 - 5.1 mmol/L   Chloride 101 101 - 111 mmol/L   CO2 25 22 - 32 mmol/L   Glucose, Bld 100 (H) 65 - 99 mg/dL   BUN 7 6 - 20 mg/dL   Creatinine, Ser 0.72 0.44 - 1.00 mg/dL   Calcium 9.5 8.9 - 10.3 mg/dL   Total Protein 7.7 6.5 - 8.1 g/dL   Albumin 3.1 (L) 3.5 - 5.0 g/dL   AST 15 15 - 41 U/L   ALT 12 (L) 14 - 54 U/L   Alkaline Phosphatase 108 38 - 126 U/L   Total Bilirubin 0.6 0.3 - 1.2 mg/dL   GFR calc non Af Amer >60 >60 mL/min   GFR calc Af Amer >60 >60 mL/min   Anion gap 11 5 - 15  I-Stat Chem 8, ED     Status: Abnormal   Collection Time: 12/19/15  1:18 PM  Result Value Ref Range   Sodium 138 135 - 145 mmol/L    Potassium 3.1 (L) 3.5 - 5.1 mmol/L   Chloride 99 (L) 101 - 111 mmol/L   BUN 4 (L) 6 - 20 mg/dL   Creatinine, Ser 0.70 0.44 - 1.00 mg/dL   Glucose, Bld 99 65 - 99 mg/dL   Calcium, Ion 1.15 1.12 - 1.23 mmol/L   TCO2 24 0 - 100 mmol/L   Hemoglobin 12.9 12.0 - 15.0 g/dL   HCT 38.0 36.0 - 46.0 %  CBC with Differential     Status: Abnormal   Collection Time: 12/19/15  2:51 PM  Result Value Ref Range   WBC 13.0 (H) 4.0 - 10.5 K/uL   RBC 4.31 3.87 - 5.11 MIL/uL  Hemoglobin 12.1 12.0 - 15.0 g/dL   HCT 36.8 36.0 - 46.0 %   MCV 85.4 78.0 - 100.0 fL   MCH 28.1 26.0 - 34.0 pg   MCHC 32.9 30.0 - 36.0 g/dL   RDW 14.4 11.5 - 15.5 %   Platelets 452 (H) 150 - 400 K/uL   Neutrophils Relative % 86 %   Neutro Abs 11.2 (H) 1.7 - 7.7 K/uL   Lymphocytes Relative 6 %   Lymphs Abs 0.8 0.7 - 4.0 K/uL   Monocytes Relative 6 %   Monocytes Absolute 0.8 0.1 - 1.0 K/uL   Eosinophils Relative 2 %   Eosinophils Absolute 0.2 0.0 - 0.7 K/uL   Basophils Relative 0 %   Basophils Absolute 0.0 0.0 - 0.1 K/uL  Urinalysis, Routine w reflex microscopic     Status: Abnormal   Collection Time: 12/19/15  4:52 PM  Result Value Ref Range   Color, Urine YELLOW YELLOW   APPearance CLEAR CLEAR   Specific Gravity, Urine 1.006 1.005 - 1.030   pH 6.0 5.0 - 8.0   Glucose, UA NEGATIVE NEGATIVE mg/dL   Hgb urine dipstick NEGATIVE NEGATIVE   Bilirubin Urine NEGATIVE NEGATIVE   Ketones, ur NEGATIVE NEGATIVE mg/dL   Protein, ur NEGATIVE NEGATIVE mg/dL   Nitrite NEGATIVE NEGATIVE   Leukocytes, UA MODERATE (A) NEGATIVE  Urine microscopic-add on     Status: Abnormal   Collection Time: 12/19/15  4:52 PM  Result Value Ref Range   Squamous Epithelial / LPF 0-5 (A) NONE SEEN   WBC, UA 0-5 0 - 5 WBC/hpf   RBC / HPF 6-30 0 - 5 RBC/hpf   Bacteria, UA FEW (A) NONE SEEN  POC occult blood, ED RN will collect     Status: None   Collection Time: 12/19/15  5:02 PM  Result Value Ref Range   Fecal Occult Bld NEGATIVE NEGATIVE  Basic  metabolic panel     Status: Abnormal   Collection Time: 12/20/15  4:36 AM  Result Value Ref Range   Sodium 137 135 - 145 mmol/L   Potassium 4.2 3.5 - 5.1 mmol/L   Chloride 104 101 - 111 mmol/L   CO2 25 22 - 32 mmol/L   Glucose, Bld 106 (H) 65 - 99 mg/dL   BUN 6 6 - 20 mg/dL   Creatinine, Ser 0.71 0.44 - 1.00 mg/dL   Calcium 9.1 8.9 - 10.3 mg/dL   GFR calc non Af Amer >60 >60 mL/min   GFR calc Af Amer >60 >60 mL/min   Anion gap 8 5 - 15  CBC     Status: Abnormal   Collection Time: 12/20/15  4:36 AM  Result Value Ref Range   WBC 10.1 4.0 - 10.5 K/uL   RBC 3.92 3.87 - 5.11 MIL/uL   Hemoglobin 10.7 (L) 12.0 - 15.0 g/dL   HCT 33.0 (L) 36.0 - 46.0 %   MCV 84.2 78.0 - 100.0 fL   MCH 27.3 26.0 - 34.0 pg   MCHC 32.4 30.0 - 36.0 g/dL   RDW 14.7 11.5 - 15.5 %   Platelets 395 150 - 400 K/uL  Differential     Status: Abnormal   Collection Time: 12/20/15  4:36 AM  Result Value Ref Range   Neutrophils Relative % 82 %   Neutro Abs 8.6 (H) 1.7 - 7.7 K/uL   Lymphocytes Relative 8 %   Lymphs Abs 0.8 0.7 - 4.0 K/uL   Monocytes Relative 8 %   Monocytes Absolute  0.8 0.1 - 1.0 K/uL   Eosinophils Relative 2 %   Eosinophils Absolute 0.2 0.0 - 0.7 K/uL   Basophils Relative 0 %   Basophils Absolute 0.0 0.0 - 0.1 K/uL  Hepatic function panel     Status: Abnormal   Collection Time: 12/20/15  4:36 AM  Result Value Ref Range   Total Protein 7.0 6.5 - 8.1 g/dL   Albumin 2.7 (L) 3.5 - 5.0 g/dL   AST 13 (L) 15 - 41 U/L   ALT 9 (L) 14 - 54 U/L   Alkaline Phosphatase 99 38 - 126 U/L   Total Bilirubin 0.6 0.3 - 1.2 mg/dL   Bilirubin, Direct 0.2 0.1 - 0.5 mg/dL   Indirect Bilirubin 0.4 0.3 - 0.9 mg/dL  Magnesium     Status: None   Collection Time: 12/20/15  4:36 AM  Result Value Ref Range   Magnesium 2.1 1.7 - 2.4 mg/dL   No results found for this or any previous visit (from the past 240 hour(s)).  Renal Function:  Recent Labs  12/19/15 1300 12/19/15 1318 12/20/15 0436  CREATININE 0.72 0.70  0.71   Estimated Creatinine Clearance: 120.3 mL/min (by C-G formula based on Cr of 0.71).  Radiologic Imaging: Ct Abdomen Pelvis W Contrast  12/19/2015  CLINICAL DATA:  Abdomen pain for 3 days. Patient has a colostomy with history of colon cancer. Currently undergoing chemotherapy. EXAM: CT ABDOMEN AND PELVIS WITH CONTRAST TECHNIQUE: Multidetector CT imaging of the abdomen and pelvis was performed using the standard protocol following bolus administration of intravenous contrast. CONTRAST:  1106m ISOVUE-300 IOPAMIDOL (ISOVUE-300) INJECTION 61% COMPARISON:  Dec 05, 2015 FINDINGS: Lower chest: No acute findings. No focal pneumonia or pleural effusion is identified. The heart size normal. Hepatobiliary: There are stable liver lesions and low densities unchanged compared to prior CT. No new lesions are identified. The gallbladder is normal. Pancreas: No mass, inflammatory changes, or other significant abnormality. Spleen: Within normal limits in size and appearance. Adrenals/Urinary Tract: There is right hydronephrosis and hydroureter without focal discrete obstructing stone identified in the right collecting system. The left kidney is normal without hydronephrosis. The adrenal glands are normal. The bladder is normal. Stomach/Bowel: A right abdominal ostomy is identified. The previously noted thick walled bowel loops leading up to ostomy are decreased with interval improvement. There is no bowel obstruction. The previously noted pelvic abscess is smaller and her exam measuring 7.1 x 4 cm. There pelvic drainage tube, the position is unchanged but there is increased inflammation surrounding the tube. Vascular/Lymphatic: No pathologically enlarged lymph nodes. No evidence of abdominal aortic aneurysm. Reproductive: No mass or other significant abnormality. Other:  Small amount of ascites is noted. Musculoskeletal: Degenerative joint changes of the spine are identified. There is anterior listhesis of L4-5. IMPRESSION:  The previously noted abscess in the presacral region smaller compared to the prior CT. However, there is interval increase inflammation surrounding the pelvic drainage tube compared to the previous exam.There is right hydronephrosis and right hydroureter probably a 3 obstruction related a inflammation previously described above the in pelvis. Stable liver changes. Electronically Signed   By: WAbelardo DieselM.D.   On: 12/19/2015 16:37   [place]dom[place] Limited Ruq  12/20/2015  CLINICAL DATA:  Right upper quadrant pain EXAM: [place]DOM[place] LIMITED - RIGHT UPPER QUADRANT COMPARISON:  CT scan 12/19/2015 FINDINGS: Gallbladder: No gallstones or wall thickening visualized. No sonographic Murphy sign noted by sonographer. Common bile duct: Diameter: 2.8 mm in diameter within normal limits. Liver: The  liver shows normal echogenicity. Stable hemangiomas in right hepatic lobe. IMPRESSION: No gallstones are noted within gallbladder. Normal CBD. No thickening of gallbladder wall. Electronically Signed   By: Lahoma Crocker M.D.   On: 12/20/2015 09:26    Impression/Recommendation:  Right hydroureteronephrosis--mild and has been present since May 2017 although it may have increased slightly.  Her pain does not appear to be related to this.  Her renal function is normal and her urine is clear.  F/u urine culture.  Renogram may be beneficial to eval for/rule out stricture.  Dr. Alinda Money will review chart and see pt later today.   [place], [place] 12/20/2015, 12:11 PM    I have seen and examined the patient and agree with the above assessment and plan.  Ms. Embree imaging suggests no delay in her nephrogram phase on CT indicating that her hydronephrosis is not likely obstructive, her renal function is normal, she does not have evidence of a UTI or fever, and her pain symptoms (epigastric) are not related to her kidney (flank).  She has no indication for intervention at this time.  The most likely etiology of her hydronephrosis would be  inflammation in the pelvis from her pelvic process and infection.  Will repeat an ultrasound in the next 1-2 weeks to ensure resolution and she recovers from her pelvic abscess treatment.  If hydronephrosis is present persistently or if she develops flank pain or renal dysfunction, will consider further evaluation (possible renogram vs endoscopic imaging evaluation) at that time.  Will follow.

## 2015-12-20 NOTE — Progress Notes (Signed)
Nutrition Brief Note  Patient identified on the Malnutrition Screening Tool (MST) Report  Wt Readings from Last 15 Encounters:  12/19/15 262 lb (118.842 kg)  12/05/15 295 lb 6.7 oz (134 kg)  11/22/15 296 lb 8.3 oz (134.5 kg)  11/13/15 281 lb 4 oz (127.574 kg)  09/29/15 285 lb 3.2 oz (129.366 kg)  09/16/15 290 lb 12.6 oz (131.9 kg)  08/03/15 300 lb 11.2 oz (136.397 kg)  06/24/15 323 lb (146.512 kg)  06/14/15 332 lb (150.594 kg)  05/28/15 341 lb 12.8 oz (155.039 kg)  05/18/15 342 lb 4 oz (155.244 kg)  04/18/15 341 lb 14.4 oz (155.085 kg)  03/28/15 337 lb 14.4 oz (153.27 kg)  03/27/15 337 lb 11.2 oz (153.18 kg)  03/24/15 341 lb (154.677 kg)   Per chart, Ms. Harmon exhibits a 46#/13% insignificant wt loss over 8 months prior to her wt loss from "this past surgery." Underwent diverting loop ileostomy 5/5, was re-admitted 5/22 for anastomotic leak. She stated that the surgeon was not concerned with weight loss. She personally denied severe wt loss. Including her most recent wt loss, it becomes a 79#/23% severe wt loss over 9 months.  Pt does not indicate if most recent wt loss was intentional or unintentional, but suspect patient would like to lose weight from her reaction to my questions and shaking her head when I stated "it appears you have lost a significant amount of weight."  She states her PO intake has been good, is continuing to follow Ileostomy diet recommendations I provided to her from her previous admission. Does not show signs of muscle wasting or fat depletion or malnutrition. Wt loss is only concerning symptom. Currently admitted with R Hydroureter.  Body mass index is 37.59 kg/(m^2). Patient meets criteria for obese class II based on current BMI.   Current diet order is NPO, patient is consuming approximately no meals at this time. Labs and medications reviewed.   No nutrition interventions warranted at this time. If nutrition issues arise, please consult RD.   Satira Anis.  Allia Wiltsey, MS, RD LDN Inpatient Clinical Dietitian Pager [phone]

## 2015-12-20 NOTE — Progress Notes (Signed)
[facility]  [address] [place], [place] [address] Phone: [phone] FAX: [phone]   Lindsay Little [id number] 12-25-1965  CARE TEAM:  PCP: Lindsay Schwalbe, MD  Outpatient Care Team: Patient Care Team: Lindsay Stains, MD as PCP - General (Family Medicine) Lindsay Boston, MD as Consulting Physician (General Surgery) Lindsay Merle, MD as Consulting Physician (Medical Oncology) Lindsay Rudd, MD as Consulting Physician (Radiation Oncology) Lindsay Irani, MD as Consulting Physician (Gastroenterology)  Inpatient Treatment Team: Treatment Team: Attending Provider: Michael Boston, MD; Registered Nurse: Lindsay Resides, RN; Technician: Lindsay Little, NT  Problem List:   Active Problems:   Rectal adenocarcinoma s/p LAR resection 05/25/2015   Ileostomy in place [facility])   Morbid obesity with BMI of 50.0-59.9, adult ([place])   IBS (irritable bowel syndrome)   Depression   Pelvic abscess s/p drainage & omental pedicle flap 11/17/2015   Large bowel delayed anastomotic leak s/p diverting loop ileostomy 11/17/2015   Dehydration   Abdominal pain   Gastroenteritis   Hydroureter on right      * No surgery found *  POST-OPERATIVE DIAGNOSIS:   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  Irritated acrochordon right lower quadrant panniculus   PROCEDURE:   LAPAROSCOPIC lysis of adhesions 60 minutes DIVERTING LOOP ILEOSTOMY  DRAINAGE OF PELVIC ABSCESS OMENTAL PEDICLE FLAP TO DEEP PELVIS EXAM UNDER ANESTHESIA WITH DISIMPACTION  EXCISION OF SKIN TAG  Surgeon(s): Lindsay Boston, MD   Assessment  Abdominal pain of uncertain etiology  Mild right hydroureter  Plan:  -IVF -retry PO -f/u abd U/S  -Restart IV ABx given inflammation  -urology consult.  Lindsay Lindsay Little not able to reach Urology last night, but I did d/w Lindsay Little.  His partner, Lindsay Little, is on call this AM - will ask them to eval & r/o delayed R  ureteral stricture.  -Port stopped working - will see can be tPA'd & opened back up  -PPI - increase -anxiolysis -control depression -VTE prophylaxis- SCDs, etc -mobilize as tolerated to help recovery  Lindsay Little, M.D., F.A.C.S. Gastrointestinal and Minimally Invasive Surgery [facility] [address] [place], [place] [address] [phone] Main / Paging   12/20/2015  Subjective:  Feeling better Nausea but no emesis Husband in room  Objective:  Vital signs:  Filed Vitals:   12/19/15 2100 12/19/15 2132 12/20/15 0205 12/20/15 0603  BP:  140/65 140/88 132/69  Pulse:  70 105 92  Temp:  98 F (36.7 C) 98.6 F (37 C) 98.2 F (36.8 C)  TempSrc:  Oral Oral Oral  Resp:  18 16 17   Height: 5' 10"  (1.778 m)     Weight: 118.842 kg (262 lb)     SpO2:  93% 94% 96%    Last BM Date: 12/19/15  Intake/Output   Yesterday:  06/06 0701 - 06/07 0700 In: 1243.3 [P.O.:360; I.V.:883.3] Out: 1200 [Urine:900; Stool:300] This shift:     Bowel function:  Flatus: YES  BM:  YES  Drain: Thin Feculent - low volume   Physical Exam:  General: Pt awake/alert/oriented x4 in mild acute distress Eyes: PERRL, normal EOM.  Sclera clear.  No icterus Neuro: CN II-XII intact w/o focal sensory/motor deficits. Lymph: No head/neck/groin lymphadenopathy Psych:  No delerium/psychosis/paranoia HENT: Normocephalic, Mucus membranes moist.  No thrush Neck: Supple, No tracheal deviation Chest: Clear. No chest wall pain w good excursion CV:  Pulses intact.  Regular rhythm MS: Normal AROM mjr joints.  No obvious deformity  Abdomen: Soft.  Nondistended.  Tenderness at epigastric region only - mild.  No evidence of peritonitis.  No incarcerated hernias.  RLQ ileostomy with stool/gas Drain thinly feculent Ext:  SCDs BLE.  No mjr edema.  No cyanosis Skin: No petechiae / purpura  Results:   Labs: Results for orders placed or performed during the hospital  encounter of 12/19/15 (from the past 48 hour(s))  Magnesium     Status: None   Collection Time: 12/19/15 12:57 PM  Result Value Ref Range   Magnesium 1.9 1.7 - 2.4 mg/dL  Lipase, blood     Status: None   Collection Time: 12/19/15  1:00 PM  Result Value Ref Range   Lipase 18 11 - 51 U/L  Comprehensive metabolic panel     Status: Abnormal   Collection Time: 12/19/15  1:00 PM  Result Value Ref Range   Sodium 137 135 - 145 mmol/L   Potassium 3.1 (L) 3.5 - 5.1 mmol/L   Chloride 101 101 - 111 mmol/L   CO2 25 22 - 32 mmol/L   Glucose, Bld 100 (H) 65 - 99 mg/dL   BUN 7 6 - 20 mg/dL   Creatinine, Ser 0.72 0.44 - 1.00 mg/dL   Calcium 9.5 8.9 - 10.3 mg/dL   Total Protein 7.7 6.5 - 8.1 g/dL   Albumin 3.1 (L) 3.5 - 5.0 g/dL   AST 15 15 - 41 U/L   ALT 12 (L) 14 - 54 U/L   Alkaline Phosphatase 108 38 - 126 U/L   Total Bilirubin 0.6 0.3 - 1.2 mg/dL   GFR calc non Af Amer >60 >60 mL/min   GFR calc Af Amer >60 >60 mL/min    Comment: (NOTE) The eGFR has been calculated using the CKD EPI equation. This calculation has not been validated in all clinical situations. eGFR's persistently <60 mL/min signify possible Chronic Kidney Disease.    Anion gap 11 5 - 15  I-Stat Chem 8, ED     Status: Abnormal   Collection Time: 12/19/15  1:18 PM  Result Value Ref Range   Sodium 138 135 - 145 mmol/L   Potassium 3.1 (L) 3.5 - 5.1 mmol/L   Chloride 99 (L) 101 - 111 mmol/L   BUN 4 (L) 6 - 20 mg/dL   Creatinine, Ser 0.70 0.44 - 1.00 mg/dL   Glucose, Bld 99 65 - 99 mg/dL   Calcium, Ion 1.15 1.12 - 1.23 mmol/L   TCO2 24 0 - 100 mmol/L   Hemoglobin 12.9 12.0 - 15.0 g/dL   HCT 38.0 36.0 - 46.0 %  CBC with Differential     Status: Abnormal   Collection Time: 12/19/15  2:51 PM  Result Value Ref Range   WBC 13.0 (H) 4.0 - 10.5 K/uL   RBC 4.31 3.87 - 5.11 MIL/uL   Hemoglobin 12.1 12.0 - 15.0 g/dL   HCT 36.8 36.0 - 46.0 %   MCV 85.4 78.0 - 100.0 fL   MCH 28.1 26.0 - 34.0 pg   MCHC 32.9 30.0 - 36.0 g/dL    RDW 14.4 11.5 - 15.5 %   Platelets 452 (H) 150 - 400 K/uL   Neutrophils Relative % 86 %   Neutro Abs 11.2 (H) 1.7 - 7.7 K/uL   Lymphocytes Relative 6 %   Lymphs Abs 0.8 0.7 - 4.0 K/uL   Monocytes Relative 6 %   Monocytes Absolute 0.8 0.1 - 1.0 K/uL   Eosinophils Relative 2 %   Eosinophils Absolute 0.2 0.0 -  0.7 K/uL   Basophils Relative 0 %   Basophils Absolute 0.0 0.0 - 0.1 K/uL  Urinalysis, Routine w reflex microscopic     Status: Abnormal   Collection Time: 12/19/15  4:52 PM  Result Value Ref Range   Color, Urine YELLOW YELLOW   APPearance CLEAR CLEAR   Specific Gravity, Urine 1.006 1.005 - 1.030   pH 6.0 5.0 - 8.0   Glucose, UA NEGATIVE NEGATIVE mg/dL   Hgb urine dipstick NEGATIVE NEGATIVE   Bilirubin Urine NEGATIVE NEGATIVE   Ketones, ur NEGATIVE NEGATIVE mg/dL   Protein, ur NEGATIVE NEGATIVE mg/dL   Nitrite NEGATIVE NEGATIVE   Leukocytes, UA MODERATE (A) NEGATIVE  Urine microscopic-add on     Status: Abnormal   Collection Time: 12/19/15  4:52 PM  Result Value Ref Range   Squamous Epithelial / LPF 0-5 (A) NONE SEEN   WBC, UA 0-5 0 - 5 WBC/hpf   RBC / HPF 6-30 0 - 5 RBC/hpf   Bacteria, UA FEW (A) NONE SEEN  POC occult blood, ED RN will collect     Status: None   Collection Time: 12/19/15  5:02 PM  Result Value Ref Range   Fecal Occult Bld NEGATIVE NEGATIVE  Basic metabolic panel     Status: Abnormal   Collection Time: 12/20/15  4:36 AM  Result Value Ref Range   Sodium 137 135 - 145 mmol/L   Potassium 4.2 3.5 - 5.1 mmol/L    Comment: RESULT REPEATED AND VERIFIED DELTA CHECK NOTED NO VISIBLE HEMOLYSIS    Chloride 104 101 - 111 mmol/L   CO2 25 22 - 32 mmol/L   Glucose, Bld 106 (H) 65 - 99 mg/dL   BUN 6 6 - 20 mg/dL   Creatinine, Ser 0.71 0.44 - 1.00 mg/dL   Calcium 9.1 8.9 - 10.3 mg/dL   GFR calc non Af Amer >60 >60 mL/min   GFR calc Af Amer >60 >60 mL/min    Comment: (NOTE) The eGFR has been calculated using the CKD EPI equation. This calculation has not been  validated in all clinical situations. eGFR's persistently <60 mL/min signify possible Chronic Kidney Disease.    Anion gap 8 5 - 15  CBC     Status: Abnormal   Collection Time: 12/20/15  4:36 AM  Result Value Ref Range   WBC 10.1 4.0 - 10.5 K/uL   RBC 3.92 3.87 - 5.11 MIL/uL   Hemoglobin 10.7 (L) 12.0 - 15.0 g/dL   HCT 33.0 (L) 36.0 - 46.0 %   MCV 84.2 78.0 - 100.0 fL   MCH 27.3 26.0 - 34.0 pg   MCHC 32.4 30.0 - 36.0 g/dL   RDW 14.7 11.5 - 15.5 %   Platelets 395 150 - 400 K/uL    Imaging / Studies: Ct Abdomen Pelvis W Contrast  12/19/2015  CLINICAL DATA:  Abdomen pain for 3 days. Patient has a colostomy with history of colon cancer. Currently undergoing chemotherapy. EXAM: CT ABDOMEN AND PELVIS WITH CONTRAST TECHNIQUE: Multidetector CT imaging of the abdomen and pelvis was performed using the standard protocol following bolus administration of intravenous contrast. CONTRAST:  175m ISOVUE-300 IOPAMIDOL (ISOVUE-300) INJECTION 61% COMPARISON:  Dec 05, 2015 FINDINGS: Lower chest: No acute findings. No focal pneumonia or pleural effusion is identified. The heart size normal. Hepatobiliary: There are stable liver lesions and low densities unchanged compared to prior CT. No new lesions are identified. The gallbladder is normal. Pancreas: No mass, inflammatory changes, or other significant abnormality. Spleen: Within normal  limits in size and appearance. Adrenals/Urinary Tract: There is right hydronephrosis and hydroureter without focal discrete obstructing stone identified in the right collecting system. The left kidney is normal without hydronephrosis. The adrenal glands are normal. The bladder is normal. Stomach/Bowel: A right abdominal ostomy is identified. The previously noted thick walled bowel loops leading up to ostomy are decreased with interval improvement. There is no bowel obstruction. The previously noted pelvic abscess is smaller and her exam measuring 7.1 x 4 cm. There pelvic drainage  tube, the position is unchanged but there is increased inflammation surrounding the tube. Vascular/Lymphatic: No pathologically enlarged lymph nodes. No evidence of abdominal aortic aneurysm. Reproductive: No mass or other significant abnormality. Other:  Small amount of ascites is noted. Musculoskeletal: Degenerative joint changes of the spine are identified. There is anterior listhesis of L4-5. IMPRESSION: The previously noted abscess in the presacral region smaller compared to the prior CT. However, there is interval increase inflammation surrounding the pelvic drainage tube compared to the previous exam.There is right hydronephrosis and right hydroureter probably a 3 obstruction related a inflammation previously described above the in pelvis. Stable liver changes. Electronically Signed   By: Abelardo Diesel M.D.   On: 12/19/2015 16:37    Medications / Allergies: per chart  Antibiotics: Anti-infectives    None        Note: Portions of this report may have been transcribed using voice recognition software. Every effort was made to ensure accuracy; however, inadvertent computerized transcription errors may be present.   Any transcriptional errors that result from this process are unintentional.     Lindsay Little, M.D., F.A.C.S. Gastrointestinal and Minimally Invasive Surgery [facility] [address], [place], [place] [address] [phone] Main / Paging   12/20/2015

## 2015-12-21 LAB — BASIC METABOLIC PANEL
Anion gap: 8 (ref 5–15)
CHLORIDE: 110 mmol/L (ref 101–111)
CO2: 22 mmol/L (ref 22–32)
CREATININE: 0.61 mg/dL (ref 0.44–1.00)
Calcium: 8.9 mg/dL (ref 8.9–10.3)
GFR calc Af Amer: >60 mL/min (ref >60)
GFR calc non Af Amer: >60 mL/min (ref >60)
Glucose, Bld: 103 mg/dL — ABNORMAL HIGH (ref 65–99)
POTASSIUM: 4.1 mmol/L (ref 3.5–5.1)
SODIUM: 140 mmol/L (ref 135–145)

## 2015-12-21 LAB — CBC
HEMATOCRIT: 29.9 % — AB (ref 36.0–46.0)
HEMOGLOBIN: 9.7 g/dL — AB (ref 12.0–15.0)
MCH: 27.5 pg (ref 26.0–34.0)
MCHC: 32.4 g/dL (ref 30.0–36.0)
MCV: 84.7 fL (ref 78.0–100.0)
Platelets: 368 10*3/uL (ref 150–400)
RBC: 3.53 MIL/uL — ABNORMAL LOW (ref 3.87–5.11)
RDW: 14.7 % (ref 11.5–15.5)
WBC: 8.7 10*3/uL (ref 4.0–10.5)

## 2015-12-21 MED ORDER — SODIUM CHLORIDE 0.9 % IV BOLUS (SEPSIS)
2000.0000 mL | Freq: Every morning | INTRAVENOUS | Status: DC
  Administered 2015-12-21 (×2): 1000 mL via INTRAVENOUS

## 2015-12-21 MED ORDER — LACTATED RINGERS IV BOLUS (SEPSIS): 2000.0000 mL | INTRAVENOUS | Status: DC

## 2015-12-21 MED ORDER — ACETAMINOPHEN 500 MG PO TABS: 500.0000 mg | ORAL_TABLET | Freq: Four times a day (QID) | ORAL | Status: DC | PRN

## 2015-12-21 MED ORDER — HYDROMORPHONE HCL 2 MG PO TABS
4.0000 mg | ORAL_TABLET | ORAL | Status: DC | PRN
  Administered 2015-12-21: 8 mg via ORAL
  Filled 2015-12-21: qty 4

## 2015-12-21 MED ORDER — LACTATED RINGERS IV BOLUS (SEPSIS): 1000.0000 mL | Freq: Three times a day (TID) | INTRAVENOUS | Status: DC | PRN

## 2015-12-21 MED ORDER — TRAMADOL HCL 50 MG PO TABS: 50.0000 mg | ORAL_TABLET | Freq: Four times a day (QID) | ORAL | Status: DC | PRN

## 2015-12-21 NOTE — Progress Notes (Signed)
[name], female   DOB: [birth year], 50 y.o.   MRN: [id number]    Subjective: Pt without new complaints.  Denies flank pain.  Objective: Vital signs in last 24 hours: Temp:  [98 F (36.7 C)-98.8 F (37.1 C)] 98.6 F (37 C) (06/08 2141) Pulse Rate:  [74-89] 74 (06/08 2141) Resp:  [16] 16 (06/08 2141) BP: (110-147)/(70-96) 110/93 mmHg (06/08 2141) SpO2:  [97 %-100 %] 100 % (06/08 2141)  Intake/Output from previous day: 06/07 0701 - 06/08 0700 In: 4191.7 [P.O.:840; I.V.:2101.7; IV Piggyback:1250] Out: 3846 [Urine:2900; Drains:14; KZLDJ:5701] Intake/Output this shift: Total I/O In: 120 [P.O.:120] Out: 825 [Urine:600; Stool:225]  Physical Exam:  General: Alert and oriented Abd: No CVAT  Lab Results:  Recent Labs  12/19/15 1451 12/20/15 0436 12/21/15 0528  HGB 12.1 10.7* 9.7*  HCT 36.8 33.0* 29.9*   BMET  Recent Labs  12/20/15 0436 12/21/15 0528  NA 137 140  K 4.2 4.1  CL 104 110  CO2 25 22  GLUCOSE 106* 103*  BUN 6 <5*  CREATININE 0.71 0.61  CALCIUM 9.1 8.9     Studies/Results:  Assessment/Plan: Right hydro: Will arrange to have patient follow up in about 2 weeks with a repeat renal ultrasound to see if hydronephrosis is resolved.  No indication for intervention at this time.   LOS: 2 days   Jamaria Amborn,LES 12/21/2015, 11:22 PM

## 2015-12-21 NOTE — Care Management Note (Signed)
Case Management Note  Patient Details  Name: Lindsay Little MRN: [id number] Date of Birth: 01-21-1966  Subjective/Objective:                  Abdominal pain Action/Plan: Discharge planning Expected Discharge Date:   (unknown)               Expected Discharge Plan:  [place]  In-House Referral:     Discharge planning Services  CM Consult  Post Acute Care Choice:  Home Health, Resumption of Svcs/PTA Provider Choice offered to:     DME Arranged:    DME Agency:  [place]:  RN [facility] Agency:  [place]  Status of Service:  In process, will continue to follow  Medicare Important Message Given:    Date Medicare IM Given:    Medicare IM give by:    Date Additional Medicare IM Given:    Additional Medicare Important Message give by:     If discussed at [place] of Stay Meetings, dates discussed:    Additional Comments: CM following for progression of pt and home health needs.  Pt is active with [facility] for IVF 3/week.  [facility] rep, Santiago Glad is aware and following for progression and needs.  CM has requested HHRN resumption orders for IV home therapies. Dellie Catholic, RN 12/21/2015, 12:47 PM

## 2015-12-21 NOTE — Progress Notes (Signed)
[address] [place], [place] [address] Phone: [phone] FAX: [phone]   Lindsay Little [id number] [birth year]  CARE TEAM:  PCP: Vidal Schwalbe, MD  Outpatient Care Team: Patient Care Team: [name], MD as PCP - General (Family Medicine) Michael Boston, MD as Consulting Physician (General Surgery) Truitt Merle, MD as Consulting Physician (Medical Oncology) Kyung Rudd, MD as Consulting Physician (Radiation Oncology) Teena Irani, MD as Consulting Physician (Gastroenterology)  Inpatient Treatment Team: Treatment Team: Attending Provider: Michael Boston, MD; Technician: Leda Quail, NT; Consulting Physician: Raynelle Bring, MD; Consulting Physician: [facility]; Registered Nurse: Ardith Dark, RN; Technician: April R Juarez, [place]  Problem List:   Active Problems:   Rectal adenocarcinoma s/p LAR resection 05/25/2015   Ileostomy in place [facility])   Morbid obesity with BMI of 50.0-59.9, adult ([place])   IBS (irritable bowel syndrome)   Depression   Pelvic abscess s/p drainage & omental pedicle flap 11/17/2015   Large bowel delayed anastomotic leak s/p diverting loop ileostomy 11/17/2015   Dehydration   Abdominal pain   Gastroenteritis   Hydroureter on right   Occlusion of Portacatheter central line      POST-OPERATIVE DIAGNOSIS:   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  Irritated acrochordon right lower quadrant panniculus   PROCEDURE 11/17/2015:   LAPAROSCOPIC lysis of adhesions 60 minutes DIVERTING LOOP ILEOSTOMY  DRAINAGE OF PELVIC ABSCESS OMENTAL PEDICLE FLAP TO DEEP PELVIS EXAM UNDER ANESTHESIA WITH DISIMPACTION  EXCISION OF SKIN TAG  Surgeon(s): Michael Boston, MD   Assessment  Abdominal pain of uncertain etiology - improved ?GERD   Plan:  -IVF - change back to boluses -adv diet PO -f/u abd U/S - negative -Restart IV ABx given inflammation.   Will do Cipro only given Nausea w metronidazole.  PCN allergy -Mild right hydroureter - clinically not significant -Port flushing (but not aspirating well -PPI - increased -anxiolysis -control depression -VTE prophylaxis- SCDs, etc -mobilize as tolerated to help recovery  [name], M.D., F.A.C.S. Gastrointestinal and Minimally Invasive Surgery [facility] [address] [place], [place] [address] [phone] Main / Paging   12/21/2015  Subjective:  Feeling better Nausea with flagyl but no emesis Husband in room  Objective:  Vital signs:  Filed Vitals:   12/20/15 1400 12/20/15 2302 12/21/15 0131 12/21/15 0554  BP: 115/71 119/77 144/96 137/89  Pulse: 44 86 78 89  Temp: 100.6 F (38.1 C) 99.2 F (37.3 C) 98.8 F (37.1 C) 98.3 F (36.8 C)  TempSrc: Oral Oral Oral Oral  Resp: 18 18 16 16   Height:      Weight:      SpO2: 97% 98% 97% 98%    Last BM Date: 12/20/15  Intake/Output   Yesterday:  06/07 0701 - 06/08 0700 In: 4191.7 [P.O.:840; I.V.:2101.7; IV Piggyback:1250] Out: 6720 [Urine:2900; Drains:14; NOBSJ:6283] This shift:     Bowel function:  Flatus: YES  BM:  YES  Drain: Thin Feculent - low volume   Physical Exam:  General: Pt awake/alert/oriented x4 in mild acute distress Eyes: PERRL, normal EOM.  Sclera clear.  No icterus Neuro: CN II-XII intact w/o focal sensory/motor deficits. Lymph: No head/neck/groin lymphadenopathy Psych:  No delerium/psychosis/paranoia HENT: Normocephalic, Mucus membranes moist.  No thrush Neck: Supple, No tracheal deviation Chest: Clear. No chest wall pain w good excursion CV:  Pulses intact.  Regular rhythm MS: Normal AROM mjr joints.  No obvious deformity Abdomen: Soft.  Nondistended.  Less TTP epigastric.Marland Kitchen  No evidence of peritonitis.  No incarcerated hernias.  RLQ ileostomy with stool/gas Drain thinly feculent Ext:  SCDs BLE.  No mjr edema.  No cyanosis Skin: No petechiae /  purpura  Results:   Labs: Results for orders placed or performed during the hospital encounter of 12/19/15 (from the past 48 hour(s))  Magnesium     Status: None   Collection Time: 12/19/15 12:57 PM  Result Value Ref Range   Magnesium 1.9 1.7 - 2.4 mg/dL  Lipase, blood     Status: None   Collection Time: 12/19/15  1:00 PM  Result Value Ref Range   Lipase 18 11 - 51 U/L  Comprehensive metabolic panel     Status: Abnormal   Collection Time: 12/19/15  1:00 PM  Result Value Ref Range   Sodium 137 135 - 145 mmol/L   Potassium 3.1 (L) 3.5 - 5.1 mmol/L   Chloride 101 101 - 111 mmol/L   CO2 25 22 - 32 mmol/L   Glucose, Bld 100 (H) 65 - 99 mg/dL   BUN 7 6 - 20 mg/dL   Creatinine, Ser 0.72 0.44 - 1.00 mg/dL   Calcium 9.5 8.9 - 10.3 mg/dL   Total Protein 7.7 6.5 - 8.1 g/dL   Albumin 3.1 (L) 3.5 - 5.0 g/dL   AST 15 15 - 41 U/L   ALT 12 (L) 14 - 54 U/L   Alkaline Phosphatase 108 38 - 126 U/L   Total Bilirubin 0.6 0.3 - 1.2 mg/dL   GFR calc non Af Amer >60 >60 mL/min   GFR calc Af Amer >60 >60 mL/min    Comment: (NOTE) The eGFR has been calculated using the CKD EPI equation. This calculation has not been validated in all clinical situations. eGFR's persistently <60 mL/min signify possible Chronic Kidney Disease.    Anion gap 11 5 - 15  I-Stat Chem 8, ED     Status: Abnormal   Collection Time: 12/19/15  1:18 PM  Result Value Ref Range   Sodium 138 135 - 145 mmol/L   Potassium 3.1 (L) 3.5 - 5.1 mmol/L   Chloride 99 (L) 101 - 111 mmol/L   BUN 4 (L) 6 - 20 mg/dL   Creatinine, Ser 0.70 0.44 - 1.00 mg/dL   Glucose, Bld 99 65 - 99 mg/dL   Calcium, Ion 1.15 1.12 - 1.23 mmol/L   TCO2 24 0 - 100 mmol/L   Hemoglobin 12.9 12.0 - 15.0 g/dL   HCT 38.0 36.0 - 46.0 %  CBC with Differential     Status: Abnormal   Collection Time: 12/19/15  2:51 PM  Result Value Ref Range   WBC 13.0 (H) 4.0 - 10.5 K/uL   RBC 4.31 3.87 - 5.11 MIL/uL   Hemoglobin 12.1 12.0 - 15.0 g/dL   HCT 36.8 36.0 - 46.0  %   MCV 85.4 78.0 - 100.0 fL   MCH 28.1 26.0 - 34.0 pg   MCHC 32.9 30.0 - 36.0 g/dL   RDW 14.4 11.5 - 15.5 %   Platelets 452 (H) 150 - 400 K/uL   Neutrophils Relative % 86 %   Neutro Abs 11.2 (H) 1.7 - 7.7 K/uL   Lymphocytes Relative 6 %   Lymphs Abs 0.8 0.7 - 4.0 K/uL   Monocytes Relative 6 %   Monocytes Absolute 0.8 0.1 - 1.0 K/uL   Eosinophils Relative 2 %   Eosinophils Absolute 0.2 0.0 - 0.7 K/uL   Basophils Relative 0 %  Basophils Absolute 0.0 0.0 - 0.1 K/uL  Urinalysis, Routine w reflex microscopic     Status: Abnormal   Collection Time: 12/19/15  4:52 PM  Result Value Ref Range   Color, Urine YELLOW YELLOW   APPearance CLEAR CLEAR   Specific Gravity, Urine 1.006 1.005 - 1.030   pH 6.0 5.0 - 8.0   Glucose, UA NEGATIVE NEGATIVE mg/dL   Hgb urine dipstick NEGATIVE NEGATIVE   Bilirubin Urine NEGATIVE NEGATIVE   Ketones, ur NEGATIVE NEGATIVE mg/dL   Protein, ur NEGATIVE NEGATIVE mg/dL   Nitrite NEGATIVE NEGATIVE   Leukocytes, UA MODERATE (A) NEGATIVE  Urine microscopic-add on     Status: Abnormal   Collection Time: 12/19/15  4:52 PM  Result Value Ref Range   Squamous Epithelial / LPF 0-5 (A) NONE SEEN   WBC, UA 0-5 0 - 5 WBC/hpf   RBC / HPF 6-30 0 - 5 RBC/hpf   Bacteria, UA FEW (A) NONE SEEN  Urine culture     Status: Abnormal   Collection Time: 12/19/15  4:52 PM  Result Value Ref Range   Specimen Description URINE, CLEAN CATCH    Special Requests NONE    Culture MULTIPLE SPECIES PRESENT, SUGGEST RECOLLECTION (A)    Report Status 12/20/2015 FINAL   POC occult blood, ED RN will collect     Status: None   Collection Time: 12/19/15  5:02 PM  Result Value Ref Range   Fecal Occult Bld NEGATIVE NEGATIVE  Basic metabolic panel     Status: Abnormal   Collection Time: 12/20/15  4:36 AM  Result Value Ref Range   Sodium 137 135 - 145 mmol/L   Potassium 4.2 3.5 - 5.1 mmol/L    Comment: RESULT REPEATED AND VERIFIED DELTA CHECK NOTED NO VISIBLE HEMOLYSIS    Chloride 104  101 - 111 mmol/L   CO2 25 22 - 32 mmol/L   Glucose, Bld 106 (H) 65 - 99 mg/dL   BUN 6 6 - 20 mg/dL   Creatinine, Ser 0.71 0.44 - 1.00 mg/dL   Calcium 9.1 8.9 - 10.3 mg/dL   GFR calc non Af Amer >60 >60 mL/min   GFR calc Af Amer >60 >60 mL/min    Comment: (NOTE) The eGFR has been calculated using the CKD EPI equation. This calculation has not been validated in all clinical situations. eGFR's persistently <60 mL/min signify possible Chronic Kidney Disease.    Anion gap 8 5 - 15  CBC     Status: Abnormal   Collection Time: 12/20/15  4:36 AM  Result Value Ref Range   WBC 10.1 4.0 - 10.5 K/uL   RBC 3.92 3.87 - 5.11 MIL/uL   Hemoglobin 10.7 (L) 12.0 - 15.0 g/dL   HCT 33.0 (L) 36.0 - 46.0 %   MCV 84.2 78.0 - 100.0 fL   MCH 27.3 26.0 - 34.0 pg   MCHC 32.4 30.0 - 36.0 g/dL   RDW 14.7 11.5 - 15.5 %   Platelets 395 150 - 400 K/uL  Differential     Status: Abnormal   Collection Time: 12/20/15  4:36 AM  Result Value Ref Range   Neutrophils Relative % 82 %   Neutro Abs 8.6 (H) 1.7 - 7.7 K/uL   Lymphocytes Relative 8 %   Lymphs Abs 0.8 0.7 - 4.0 K/uL   Monocytes Relative 8 %   Monocytes Absolute 0.8 0.1 - 1.0 K/uL   Eosinophils Relative 2 %   Eosinophils Absolute 0.2 0.0 - 0.7 K/uL  Basophils Relative 0 %   Basophils Absolute 0.0 0.0 - 0.1 K/uL  Hepatic function panel     Status: Abnormal   Collection Time: 12/20/15  4:36 AM  Result Value Ref Range   Total Protein 7.0 6.5 - 8.1 g/dL   Albumin 2.7 (L) 3.5 - 5.0 g/dL   AST 13 (L) 15 - 41 U/L   ALT 9 (L) 14 - 54 U/L   Alkaline Phosphatase 99 38 - 126 U/L   Total Bilirubin 0.6 0.3 - 1.2 mg/dL   Bilirubin, Direct 0.2 0.1 - 0.5 mg/dL   Indirect Bilirubin 0.4 0.3 - 0.9 mg/dL  Magnesium     Status: None   Collection Time: 12/20/15  4:36 AM  Result Value Ref Range   Magnesium 2.1 1.7 - 2.4 mg/dL  CBC     Status: Abnormal   Collection Time: 12/21/15  5:28 AM  Result Value Ref Range   WBC 8.7 4.0 - 10.5 K/uL   RBC 3.53 (L) 3.87 -  5.11 MIL/uL   Hemoglobin 9.7 (L) 12.0 - 15.0 g/dL   HCT 29.9 (L) 36.0 - 46.0 %   MCV 84.7 78.0 - 100.0 fL   MCH 27.5 26.0 - 34.0 pg   MCHC 32.4 30.0 - 36.0 g/dL   RDW 14.7 11.5 - 15.5 %   Platelets 368 150 - 400 K/uL  Basic metabolic panel     Status: Abnormal   Collection Time: 12/21/15  5:28 AM  Result Value Ref Range   Sodium 140 135 - 145 mmol/L   Potassium 4.1 3.5 - 5.1 mmol/L   Chloride 110 101 - 111 mmol/L   CO2 22 22 - 32 mmol/L   Glucose, Bld 103 (H) 65 - 99 mg/dL   BUN <5 (L) 6 - 20 mg/dL   Creatinine, Ser 0.61 0.44 - 1.00 mg/dL   Calcium 8.9 8.9 - 10.3 mg/dL   GFR calc non Af Amer >60 >60 mL/min   GFR calc Af Amer >60 >60 mL/min    Comment: (NOTE) The eGFR has been calculated using the CKD EPI equation. This calculation has not been validated in all clinical situations. eGFR's persistently <60 mL/min signify possible Chronic Kidney Disease.    Anion gap 8 5 - 15    Imaging / Studies: Ct Abdomen Pelvis W Contrast  12/19/2015  CLINICAL DATA:  Abdomen pain for 3 days. Patient has a colostomy with history of colon cancer. Currently undergoing chemotherapy. EXAM: CT ABDOMEN AND PELVIS WITH CONTRAST TECHNIQUE: Multidetector CT imaging of the abdomen and pelvis was performed using the standard protocol following bolus administration of intravenous contrast. CONTRAST:  128m ISOVUE-300 IOPAMIDOL (ISOVUE-300) INJECTION 61% COMPARISON:  Dec 05, 2015 FINDINGS: Lower chest: No acute findings. No focal pneumonia or pleural effusion is identified. The heart size normal. Hepatobiliary: There are stable liver lesions and low densities unchanged compared to prior CT. No new lesions are identified. The gallbladder is normal. Pancreas: No mass, inflammatory changes, or other significant abnormality. Spleen: Within normal limits in size and appearance. Adrenals/Urinary Tract: There is right hydronephrosis and hydroureter without focal discrete obstructing stone identified in the right  collecting system. The left kidney is normal without hydronephrosis. The adrenal glands are normal. The bladder is normal. Stomach/Bowel: A right abdominal ostomy is identified. The previously noted thick walled bowel loops leading up to ostomy are decreased with interval improvement. There is no bowel obstruction. The previously noted pelvic abscess is smaller and her exam measuring 7.1 x 4 cm. There  pelvic drainage tube, the position is unchanged but there is increased inflammation surrounding the tube. Vascular/Lymphatic: No pathologically enlarged lymph nodes. No evidence of abdominal aortic aneurysm. Reproductive: No mass or other significant abnormality. Other:  Small amount of ascites is noted. Musculoskeletal: Degenerative joint changes of the spine are identified. There is anterior listhesis of L4-5. IMPRESSION: The previously noted abscess in the presacral region smaller compared to the prior CT. However, there is interval increase inflammation surrounding the pelvic drainage tube compared to the previous exam.There is right hydronephrosis and right hydroureter probably a 3 obstruction related a inflammation previously described above the in pelvis. Stable liver changes. Electronically Signed   By: Abelardo Diesel M.D.   On: 12/19/2015 16:37   US Abdomen Limited Ruq  12/20/2015  CLINICAL DATA:  Right upper quadrant pain EXAM: US ABDOMEN LIMITED - RIGHT UPPER QUADRANT COMPARISON:  CT scan 12/19/2015 FINDINGS: Gallbladder: No gallstones or wall thickening visualized. No sonographic Murphy sign noted by sonographer. Common bile duct: Diameter: 2.8 mm in diameter within normal limits. Liver: The liver shows normal echogenicity. Stable hemangiomas in right hepatic lobe. IMPRESSION: No gallstones are noted within gallbladder. Normal CBD. No thickening of gallbladder wall. Electronically Signed   By: Lahoma Crocker M.D.   On: 12/20/2015 09:26    Medications / Allergies: per chart  Antibiotics: Anti-infectives     Start     Dose/Rate Route Frequency Ordered Stop   12/20/15 0800  metroNIDAZOLE (FLAGYL) IVPB 500 mg     500 mg 100 mL/hr over 60 Minutes Intravenous Every 6 hours 12/20/15 0737     12/20/15 0800  ciprofloxacin (CIPRO) tablet 500 mg     500 mg Oral 2 times daily 12/20/15 8088          Note: Portions of this report may have been transcribed using voice recognition software. Every effort was made to ensure accuracy; however, inadvertent computerized transcription errors may be present.   Any transcriptional errors that result from this process are unintentional.     [name], M.D., F.A.C.S. Gastrointestinal and Minimally Invasive Surgery [facility] [address] [place],  [address] [phone] Main / Paging   12/21/2015

## 2015-12-22 ENCOUNTER — Encounter: Payer: BLUE CROSS/BLUE SHIELD | Admitting: Hematology

## 2015-12-22 ENCOUNTER — Telehealth: Payer: Self-pay | Admitting: Hematology

## 2015-12-22 ENCOUNTER — Encounter: Payer: Self-pay | Admitting: Hematology

## 2015-12-22 ENCOUNTER — Other Ambulatory Visit: Payer: BLUE CROSS/BLUE SHIELD

## 2015-12-22 ENCOUNTER — Ambulatory Visit: Payer: BLUE CROSS/BLUE SHIELD | Admitting: Hematology

## 2015-12-22 MED ORDER — HEPARIN SOD (PORK) LOCK FLUSH 100 UNIT/ML IV SOLN
500.0000 [IU] | INTRAVENOUS | Status: AC | PRN
  Administered 2015-12-22: 500 [IU]

## 2015-12-22 MED ORDER — CIPROFLOXACIN HCL 500 MG PO TABS: 500.0000 mg | ORAL_TABLET | Freq: Two times a day (BID) | ORAL | Status: DC

## 2015-12-22 MED ORDER — ESOMEPRAZOLE MAGNESIUM 40 MG PO CPDR: 40.0000 mg | DELAYED_RELEASE_CAPSULE | Freq: Two times a day (BID) | ORAL | Status: DC

## 2015-12-22 NOTE — Telephone Encounter (Signed)
s.w. pt and r/s appt from today to Aug..Marland KitchenMarland KitchenMarland Kitchenpt ok and aware of new d.t

## 2015-12-22 NOTE — Discharge Instructions (Signed)
Ostomy Support Information ° °You’ve heard that people get along just fine with only one of their eyes, or one of their lungs, or one of their kidneys. But you also know that you have only one intestine and only one bladder, and that leaves you feeling awfully empty, both physically and emotionally: You think no other people go around without part of their intestine with the ends of their intestines sticking out through their abdominal walls.  ° °YOU ARE NOT ALONE.  There are nearly three quarters of a million people in the [place] who have an ostomy; people who have had surgery to remove all or part of their colons or bladders.  ° °There is even a national association, the [employer] with over 350 local affiliated support groups that are organized by volunteers who provide peer support and counseling. [employer] has a toll free telephone num-ber, 800-826-0826 and an educational, interactive website, www.ostomy.org  ° °An ostomy is an opening in the belly (abdominal wall) made by surgery. Ostomates are people who have had this procedure. The opening (stoma) allows the kidney or bowel to discharge waste. An external pouch covers the stoma to collect waste. Pouches are are a simple bag and are odor free. Different companies have disposable or reusable pouches to fit one's lifestyle. An ostomy can either be temporary or permanent.  ° °THERE ARE THREE MAIN TYPES OF OSTOMIES °· Colostomy. A colostomy is a surgically created opening in the large intestine (colon). °· Ileostomy. An ileostomy is a surgically created opening in the small intestine. °· Urostomy. A urostomy is a surgically created opening to divert urine away from the bladder. ° °OSTOMY Care ° °The following guidelines will make care of your colostomy easier. Keep this information close by for quick reference. ° °Helpful DIET hints °Eat a well-balanced diet including vegetables and fresh fruits. Eat on a regular schedule. Drink at least 6 to 8  glasses of fluids daily. °Eat slowly in a relaxed atmosphere. Chew your food thoroughly. Avoid chewing gum, smoking, and drinking from a straw. This will help decrease the amount of air you swallow, which may help reduce gas. °Eating yogurt or drinking buttermilk may help reduce gas. ° °To control gas at night, do not eat after 8 p.m. This will give your bowel time to quiet down before you go to bed. ° °If gas is a problem, you can purchase Beano. Sprinkle Beano on the first bite of food before eating to reduce gas. It has no flavor and should not change the taste of your food. You can buy Beano over the counter at your local drugstore. ° °Foods like fish, onions, garlic, broccoli, asparagus, and cabbage produce odor. Although your pouch is odor-proof, if you eat these foods you may notice a stronger odor when emptying your pouch. If this is a concern, you may want to limit these foods in your diet. ° °If you have an ileostomy, you will have chronic diarrhea & need to drink more liquids to avoid getting dehydrated.  Consider antidiarrheal medicine like imodium (loperamide) or Lomotil to help slow down bowel movements / diarrhea into your ileostomy bag. ° °GETTING TO GOOD BOWEL HEALTH WITH AN ILEOSTOMY °. °Irregular bowel habits such as constipation and diarrhea can lead to many problems over time.  The goal: 3-6 small BOWEL MOVEMENTS A DAY!  To have soft, regular bowel movements:  °• Drink plenty of fluids, consider 4-6 tall glasses of water a day.   ° °Controlling   diarrheA ° °o Switch to liquids and simpler foods for a few days to avoid stressing your intestines further. °o Avoid dairy products (especially milk & ice cream) for a short time.  The intestines often can lose the ability to digest lactose when stressed. °o Avoid foods that cause gassiness or bloating.  Typical foods include beans and other legumes, cabbage, broccoli, and dairy foods.  Every person has some sensitivity to other foods, so listen to our  body and avoid those foods that trigger problems for you. °o Adding fiber (Citrucel, Metamucil, psyllium, Miralax) gradually can help thicken stools by absorbing excess fluid and retrain the intestines to act more normally.  Slowly increase the dose over a few weeks.  Too much fiber too soon can backfire and cause cramping & bloating. °o Probiotics (such as active yogurt, Align, etc) may help repopulate the intestines and colon with normal bacteria and calm down a sensitive digestive tract.  Most studies show it to be of mild help, though, and such products can be costly. °o Medicines: °- Bismuth subsalicylate (ex. Kayopectate, Pepto Bismol) every 30 minutes for up to 6 doses can help control diarrhea.  Avoid if pregnant. °- Loperamide (Immodium) can slow down diarrhea.  Start with two tablets (4mg total) first and then try one tablet every 6 hours.  Avoid if you are having fevers or severe pain.  If you are not better or start feeling worse, stop all medicines and call your doctor for advice °o Call your doctor if you are getting worse or not better.  Sometimes further testing (cultures, endoscopy, X-ray studies, bloodwork, etc) may be needed to help diagnose and treat the cause of the diarrhea. ° °TROUBLESHOOTING IRREGULAR BOWELS °1) Avoid extremes of bowel movements (no bad constipation/diarrhea) °2) Miralax 17gm mixed in 8oz. water or juice-daily. May use twice a day as needed  °3) Gas-x,Phazyme, etc. as needed for gas & bloating.  °4) Soft,bland diet. No spicy,greasy,fried foods.  °5) Prilosec (omeprazole) over-the-counter as needed  °6) May hold gluten/wheat products from diet to see if symptoms improve.  °7)  May try probiotics (Align, Activa, etc) to help calm the bowels down °7) If symptoms become worse call back immediately. ° ° °Applying the pouching system °To apply your pouch, follow these steps: ° °Place all your equipment close at hand before removing your pouch. ° °Wash your hands. ° °Stand or sit in  front of a mirror. Use the position that works best for you. Remember that you must keep the skin around the stoma wrinkle-free for a good seal. ° °Gently remove the used pouch (1-piece system) or the pouch and old wafer (2-piece system). Empty the pouch into the toilet. Save the closure clip to use again. ° °Wash the stoma itself and the skin around the stoma. Your stoma may bleed a little when being washed. This is normal. Rinse and pat dry. You may use a wash cloth or soft paper towels (like Bounty), mild soap (like Dial, Safeguard, or Ivory), and water. Avoid soaps that contain perfumes or lotions. ° °For a new pouch (1-piece system) or a new wafer (2-piece system), measure your stoma using the stoma guide in each box of supplies. ° °Trace the shape of your stoma onto the back of the new pouch or the back of the new wafer. Cut out the opening. Remove the paper backing and set it aside. ° °Optional: Apply a skin barrier powder to surrounding skin if it is irritated (bare or weeping),   and dust off the excess. °Optional: Apply a skin-prep wipe (such as Skin Prep or All-Kare) to the skin around the stoma, and let it dry. Do not apply this solution if the skin is irritated (red, tender, or broken) or if you have shaved around the stoma. °Optional: Apply a skin barrier paste (such as Stomahesive, Coloplast, or Premium) around the opening cut in the back of the pouch or wafer. Allow it to dry for 30 to 60 seconds. ° °Hold the pouch (1-piece system) or wafer (2-piece system) with the sticky side toward your body. Make sure the skin around the stoma is wrinkle-free. Center the opening on the stoma, then press firmly to your abdomen (Fig. 4). Look in the mirror to check if you are placing the pouch, or wafer, in the right position. For a 2-piece system, snap the pouch onto the wafer. Make sure it snaps into place securely. ° °Place your hand over the stoma and the pouch or wafer for about 30 seconds. The heat from your  hand can help the pouch or wafer stick to your skin. ° °Add deodorant (such as Super Banish or Nullo) to your pouch. Other options include food extracts such as vanilla oil and peppermint extract. Add about 10 drops of the deodorant to the pouch. Then apply the closure clamp. Note: Do not use toxic ° chemicals or commercial cleaning agents in your pouch. These substances may harm the stoma. ° °Optional: For extra seal, apply tape to all 4 sides around the pouch or wafer, as if you were framing a picture. You may use any brand of medical adhesive tape. °Change your pouch every 5 to 7 days. Change it immediately if a leak occurs.  Wash your hands afterwards. ° °If you are wearing a 2-piece system, you may use 2 new pouches per week and alternate them. Rinse the pouch with mild soap and warm water and hang it to dry for the next day. Apply the fresh pouch. Alternate the 2 pouches like this for a week. After a week, change the wafer and begin with 2 new pouches. Place the old pouches in a plastic bag, and put them in the trash. ° ° ° °Tips for colostomy care ° °Applying Your Pouch °You may stand or sit to apply your pouch. ° °Keep the skin where you apply the pouch wrinkle-free. If the skin around the pouch is wrinkled, the seal may break when your skin stretches. ° °If hair grows close to your stoma, you may trim off the hair with scissors, an electric razor, or a safety razor. ° °Always have a mirror nearby so you can get a better view of your stoma. ° °When you apply a new pouch, write the date on the adhesive tape. This will remind you of when you last changed your pouch. ° °Changing Your Pouch °The best time to change your pouch is in the morning, before eating or drinking anything. Your stoma can function at any time, but it will function more after eating or drinking. ° °Emptying Your Pouch °Empty your pouch when it is one-third full (of urine, stool, and/or gas). If you wait until your pouch is fuller than this,  it will be more difficult to empty and more noticeable. °When you empty your pouch, either put toilet paper in the toilet bowl first, or flush the toilet while you empty the pouch. This will reduce splashing. You can empty the pouch between your legs or to one side while sitting,   or while standing or stooping. If you have a 2-piece system, you can snap off the pouch to empty it. Remember that your stoma may function during this time. °If you wish to rinse your pouch after you empty it, a turkey baster can be helpful. When using a baster, squirt water up into the pouch through the opening at the °bottom. With a 2-piece system, you can snap off the pouch to rinse it. After rinsing  your pouch, empty it into the toilet. °When rinsing your pouch at home, put a few granules of Dreft soap in the rinse water. This helps lubricate and freshen your pouch. °The inside of your pouch can be sprayed with non-stick cooking oil (Pam spray). This may help reduce stool sticking to the inside of the pouch. ° °Bathing °You may shower or bathe with your pouch on or off. Remember that your stoma may function during this time. ° °The materials you use to wash your stoma and the skin around it should be clean, but they do not need to be sterile. ° °Wearing Your Pouch °During hot weather, or if you perspire a lot in general, wear a cover over your pouch. This may prevent a rash on your skin under the pouch. Pouch covers are sold at ostomy supply stores. °Wear the pouch inside your underwear for better support. °Watch your weight. Any gain or loss of 10 to 15 pounds or more can change the way your pouch fits. ° °Going Away From Home °A collapsible cup (like those that come in travel kits) or a soft plastic squirt bottle with a pull-up top (like a travel bottle for shampoo) can be used for rinsing your pouch when you are away from home. Tilt the opening of the pouch at an upward angle when using a cup to rinse. ° °Carry wet wipes or extra  tissues to use in public bathrooms. ° °Carry an extra pouching system with you at all times. ° °Never keep ostomy supplies in the glove compartment of your car. Extreme heat or cold can damage the skin barriers and adhesive wafers on the pouch. ° °When you travel, carry your ostomy supplies with you at all times. Keep them within easy reach. Do not pack ostomy supplies in baggage that will be checked or otherwise separated from you, because your baggage might be lost. If you’re traveling out of the country, it is helpful to have a letter stating that you are carrying ostomy supplies as a medical necessity. ° °If you need ostomy supplies while traveling, look in the yellow pages of the telephone book under “Surgical Supplies.” Or call the local ostomy organization to find out where supplies are available. ° °Do not let your ostomy supplies get low. Always order new pouches before you use the last one. ° °Reducing Odor °Limit foods such as broccoli, cabbage, onions, fish, and garlic in your diet to help reduce odor. °Each time you empty your pouch, carefully clean the opening of the pouch, both inside and outside, with toilet paper. °Rinse your pouch 1 or 2 times daily after you empty it (see directions for emptying your pouch and going away from home). °Add deodorant (such as Super Banish or Nullo) to your pouch. °Use air deodorizers in your bathroom. °Do not add aspirin to your pouch. Even though aspirin can help prevent odor, it could cause ulcers on your stoma. ° °When to call the doctor °Call the doctor if you have any of the following symptoms: °Purple, black,   or white stoma Severe cramps lasting more than 6 hours Severe watery discharge from the stoma lasting more than 6 hours No output from the colostomy for 3 days Excessive bleeding from your stoma Swelling of your stoma to more than 1/2-inch larger than usual Pulling inward of your stoma below skin level Severe skin irritation or deep ulcers Bulging  or other changes in your abdomen  When to call your ostomy nurse Call your ostomy/enterostomal therapy (ET) nurse if any of the following occurs: Frequent leaking of your pouching system Change in size or appearance of your stoma, causing discomfort or problems with your pouch Skin rash or rawness Weight gain or loss that causes problems with your pouch      FREQUENTLY ASKED QUESTIONS   Why havent you met any of these folks who have an ostomy?  Well, maybe you have! You just did not recognize them because an ostomy doesn't show. It can be kept secret if you wish. Why, maybe some of your best friends, office associates or neighbors have an ostomy ... you never can tell.   People facing ostomy surgery have many quality-of-life questions like:  Will you bulge? Smell? Make noises? Will you feel waste leaving your body? Will you be a captive of the toilet? Will you starve? Be a social outcast? Get/stay married? Have babies? Easily bathe, go swimming, bend over?  OK, lets look at what you can expect:   Will you bulge?  Remember, without part of the intestine or bladder, and its contents, you should have a flatter tummy than before. You can expect to wear, with little exception, what you wore before surgery ... and this in-cludes tight clothing and bathing suits.   Will you smell?  Today, thanks to modern odor proof pouching systems, you can walk into an ostomy support group meeting and not smell anything that is foul or offensive. And, for those with an ileostomy or colostomy who are concerned about odor when emptying their pouch, there are in-pouch deodorants that can be used to eliminate any waste odors that may exist.   Will you make noises?  Everyone produces gas, especially if they are an air-swallower. But intestinal sounds that occur from time to time are no differ-ent than a gurgling tummy, and quite often your clothing will muffle any sounds.   Will you feel the waste discharges?   For those with a colostomy or ileostomy there might be a slight pressure when waste leaves your body, but understand that the intestines have no nerve endings, so there will be no unpleasant sensations. Those with a urostomy will probably be unaware of any kidney drainage.   Will you be a captive of the toilet?  Immediately post-op you will spend more time in the bathroom than you will after your body recovers from surgery. Every person is different, but on average those with an ileostomy or urostomy may empty their pouches 4 to 6 times a day; a little  less if you have a colostomy. The average wear time between pouch system changes is 3 to 5 days and the changing process should take less than 30 minutes.   Will I need to be on a special diet? Most people return to their normal diet when they have recovered from surgery. Be sure to chew your food well, eat a well-balanced diet and drink plenty of fluids. If you experience problems with a certain food, wait a couple of weeks and try it again.  Will there be  odor and noises? °Pouching systems are designed to be odor-proof or odor-resistant. There are deodorants that can be used in the pouch. Medications are also available to help reduce odor. Limit gas-producing foods and carbonated beverages. You will experience less gas and fewer noises as you heal from surgery. ° °How much time will it take to care for my ostomy? °At first, you may spend a lot of time learning about your ostomy and how to take care of it. As you become more comfortable and skilled at changing the pouching system, it will take very little time to care for it.  ° °Will I be able to return to work? °People with ostomies can perform most jobs. As soon as you have healed from surgery, you should be able to return to work. Heavy lifting (more than 10 pounds) may be discouraged.  ° °What about intimacy? °Sexual relationships and intimacy are important and fulfilling aspects of your life. They  should continue after ostomy surgery. Intimacy-related concerns should be discussed openly between you and your partner.  ° °Can I wear regular clothing? °You do not need to wear special clothing. Ostomy pouches are fairly flat and barely noticeable. Elastic undergarments will not hurt the stoma or prevent the ostomy from functioning.  ° °Can I participate in sports? °An ostomy should not limit your involvement in sports. Many people with ostomies are runners, skiers, swimmers or participate in other active lifestyles. Talk with your caregiver first before doing heavy physical activity. ° °Will you starve?  °Not if you follow doctor’s orders at each stage of your post-op adjustment. There is no such thing as an “ostomy diet”. Some people with an ostomy will be able to eat and tolerate anything; others may find diffi-culty with some foods. Each person is an individual and must determine, by trial, what is best for them. A good practice for all is to drink plenty of water.  ° °Will you be a social outcast?  °Have you met anyone who has an ostomy and is a social outcast? Why should you be the first? Only your attitude and self image will effect how you are treated. No confi-dent person is an outcast.  ° ° ° °PROFESSIONAL HELP  °Resources are available if you need help or have questions about your ostomy.  ° °· Specially trained nurses called Wound, Ostomy Continence Nurses (WOCN) are available for consultation in most major medical centers. ° °· Consider getting an ostomy consult with Kathy Probst at [facility] to help troubleshoot stoma pouch fittings and other issues with your ostomy: [phone] ° °· The United Ostomy Association (UOA) is a group made up of many local chapters throughout the [place]. These local groups hold meetings and provide support to prospective and existing ostomates. They sponsor educational events and have qualified visitors to make personal or telephone visits. Contact  the UOA for the chapter nearest you and for other educational publications. ° °· More detailed information can be found in Colostomy Guide, a publication of the United Ostomy Association (UOA). Contact UOA at 1-800-826-0826 or visit their web site at www.uoaa.org. The website contains links to other sites, suppliers and resources. ° °· [facility] Secure Start Services: °· Start at the website to enlist for support.  Your Wound Ostomy (WOCN) nurse may have started this process. https://www.[facility].com/en/securestart °· Secure Start services are designed to support people as they live their lives with an ostomy or neurogenic bladder. Enrolling is easy and at no cost to the   patient. We realize that each person's needs and life journey are different. Through Secure Start services, we want to help people live their life, their way.  DRAIN CARE:   You have a closed bulb drain to help you heal.    A bulb drain is a small, plastic reservoir which creates a gentle suction. It is used to remove excess fluid from a surgical wound. The color and amount of fluid will vary. Immediately after surgery, the fluid is bright red. It may gradually change to a yellow color. When the amount decreases to about 1 or 2 tablespoons (15 to 30 cc) per 24 hours, your caregiver will usually remove it.  JP Care  The Jackson-Pratt drainage system has flexible tubing attached to a soft, plastic bulb with a stopper. The drainage end of the tubing, which is flat and white, goes into your body through a small opening near your incision (surgical cut). A stitch holds the drainage end in place. The rest of the tube is outside your body, attached to the bulb. When the bulb is compressed with the stopper in place, it creates a vacuum. This causes a constant gentle suction, which helps draw out fluid that collects under your incision. The bulb should be compressed at all times, except when you are emptying the drainage.  How long you will  have your Jackson-Pratt depends on your surgery and the amount of fluid is draining. This is different for everyone. The Jackson-Pratt is usually removed when the drainage is 30 mL or less over 24 hours. To keep track of how much drainage youre having, you will record the amount in a drainage log. Its important to bring the log with you to your follow-up appointments.  Caring for Your Jackson-Pratt at Home In order to care for your Jackson-Pratt at home, you or your caregiver will do the following:  Empty the drain once a day and record the color and amount of drainage  Care for the area where the tubing enters your skin by washing with soap and water.  Milk the tubing to help move clots into the bulb.  Do this before you empty and measure your drainage. Look in the mirror at the tubing. This will help you see where your hands need to be. Pinch the tubing close to where it goes into your skin between your thumb and forefinger. With the thumb and forefinger of your other hand, pinch the tubing right below your other fingers. Keep your fingers pinched and slide them down the tubing, pushing any clots down toward the bulb. You may want to use alcohol swabs to help you slide your fingers down the tubing. Repeat steps 3 and 4 as necessary to push clots from the tubing into the bulb. If you are not able to move a clot into the bulb, call your doctors office. The fluid may leak around the insertion site if a clot is blocking the drainage flow. If there is fluid in the bulb and no leakage at the insertion site, the drain is working.  How to Empty Your Jackson-Pratt and Record the Drainage You will need to empty your Jackson-Pratt every day  Gather the following supplies:  Measuring container your nurse gave you Jackson-Pratt Drainage Record  Pen or pencil  Instructions Clean an area to work on. Clean your hands thoroughly. Unplug the stopper on top of your Jackson-Pratt. This will cause the  bulb to expand. Do not touch the inside of the stopper or the inner  area of the opening on the bulb. Turn your Jackson-Pratt upside down, gently squeeze the bulb, and pour the drainage into the measuring container. Turn your Jackson-Pratt right side up. Squeeze the bulb until your fingers feel the palm of your hand. Keep squeezing the bulb while you replug the stopper. Make sure the bulb stays fully compressed to ensure constant, gentle suction.    Check the amount and color of drainage in the measuring container. The first couple days after surgery the fluid may be dark red. This is normal. As you heal the fluid may look pink or pale yellow. Record this amount and the color of drainage on your [facility] Drainage Record. Flush the drainage down the toilet and rinse the measuring container with water.  Caring for the Insertion Site Once you have emptied the drainage, clean your hands again. Check the area around the insertion site. Look for tenderness, swelling, or pus. If you have any of these, or if you have a temperature of 101 F (38.3 C) or higher, you may have an infection. Call your doctors office.  Sometimes, the drain causes redness the size of a dime at your insertion site. This is normal. Your healthcare provider will tell you if you should place a bandage over the insertion site.      DAILY CARE  Keep the bulb compressed at all times, except while emptying it. The compression creates suction.   Keep sites where the tubes enter the skin dry and covered with a light bandage (dressing).   Tape the tubes to your skin, 1 to 2 inches below the insertion sites, to keep from pulling on your stitches. Tubes are stitched in place and will not slip out.   Pin the bulb to your shirt (not to your pants) with a safety pin.   For the first few days after surgery, there usually is more fluid in the bulb. Empty the bulb whenever it becomes half full because the bulb does not create  enough suction if it is too full. Include this amount in your 24 hour totals.   When the amount of drainage decreases, empty the bulb at the same time every day. Write down the amounts and the 24 hour totals. Your caregiver will want to know them. This helps your caregiver know when the tubes can be removed.   (We anticipate removing the drain in 1-3 weeks, depending on when the output is <56m a day for 2+ days)  If there is drainage around the tube sites, change dressings and keep the area dry. If you see a clot in the tube, leave it alone. However, if the tube does not appear to be draining, let your caregiver know.  TO EMPTY THE BULB  Open the stopper to release suction.   Holding the stopper out of the way, pour drainage into the measuring cup that was sent home with you.   Measure and write down the amount. If there are 2 bulbs, note the amount of drainage from bulb 1 or bulb 2 and keep the totals separate. Your caregiver will want to know which tube is draining more.   Compress the bulb by folding it in half.   Replace the stopper.   Check the tape that holds the tube to your skin, and pin the bulb to your shirt.  SEEK MEDICAL CARE IF:  The drainage develops a bad odor.   You have an oral temperature above 102 F (38.9 C).   The amount of  drainage from your wound suddenly increases or decreases.   You accidentally pull out your drain.   You have any other questions or concerns.  MAKE SURE YOU:   Understand these instructions.   Will watch your condition.   Will get help right away if you are not doing well or get worse.     Call our office if you have any questions about your drain. [phone]  Gastritis, Adult Gastritis is soreness and swelling (inflammation) of the lining of the stomach. Gastritis can develop as a sudden onset (acute) or long-term (chronic) condition. If gastritis is not treated, it can lead to stomach bleeding and ulcers. CAUSES  Gastritis  occurs when the stomach lining is weak or damaged. Digestive juices from the stomach then inflame the weakened stomach lining. The stomach lining may be weak or damaged due to viral or bacterial infections. One common bacterial infection is the Helicobacter pylori infection. Gastritis can also result from excessive alcohol consumption, taking certain medicines, or having too much acid in the stomach.  SYMPTOMS  In some cases, there are no symptoms. When symptoms are present, they may include:  Pain or a burning sensation in the upper abdomen.  Nausea.  Vomiting.  An uncomfortable feeling of fullness after eating. DIAGNOSIS  Your caregiver may suspect you have gastritis based on your symptoms and a physical exam. To determine the cause of your gastritis, your caregiver may perform the following:  Blood or stool tests to check for the H pylori bacterium.  Gastroscopy. A thin, flexible tube (endoscope) is passed down the esophagus and into the stomach. The endoscope has a light and camera on the end. Your caregiver uses the endoscope to view the inside of the stomach.  Taking a tissue sample (biopsy) from the stomach to examine under a microscope. TREATMENT  Depending on the cause of your gastritis, medicines may be prescribed. If you have a bacterial infection, such as an H pylori infection, antibiotics may be given. If your gastritis is caused by too much acid in the stomach, H2 blockers or antacids may be given. Your caregiver may recommend that you stop taking aspirin, ibuprofen, or other nonsteroidal anti-inflammatory drugs (NSAIDs). HOME CARE INSTRUCTIONS  Only take over-the-counter or prescription medicines as directed by your caregiver.  If you were given antibiotic medicines, take them as directed. Finish them even if you start to feel better.  Drink enough fluids to keep your urine clear or pale yellow.  Avoid foods and drinks that make your symptoms worse, such as:  Caffeine or  alcoholic drinks.  Chocolate.  Peppermint or mint flavorings.  Garlic and onions.  Spicy foods.  Citrus fruits, such as oranges, lemons, or limes.  Tomato-based foods such as sauce, chili, salsa, and pizza.  Fried and fatty foods.  Eat small, frequent meals instead of large meals. SEEK IMMEDIATE MEDICAL CARE IF:   You have black or dark red stools.  You vomit blood or material that looks like coffee grounds.  You are unable to keep fluids down.  Your abdominal pain gets worse.  You have a fever.  You do not feel better after 1 week.  You have any other questions or concerns. MAKE SURE YOU:  Understand these instructions.  Will watch your condition.  Will get help right away if you are not doing well or get worse.   This information is not intended to replace advice given to you by your health care provider. Make sure you discuss any questions you  have with your health care provider.   Document Released: 06/25/2001 Document Revised: 12/31/2011 Document Reviewed: 08/14/2011 Elsevier Interactive Patient Education 2016 [place] [facility] Access Placement, Care After Refer to this sheet after your procedure. These instructions provide you with information on caring for yourself after your procedure. Your health care provider may also give you more specific instructions. Your treatment has been planned according to current medical practices, but problems sometimes occur. Call your health care provider if you have any problems or questions after your procedure.  WHAT TO EXPECT AFTER THE PROCEDURE  You may feel some discomfort after the local anesthetic wears off. Your discomfort should gradually improve over the next several days. Ask your health care provider if you can take pain medicines.  You may notice some redness, mild pain, swelling, bruising, or light bleeding at the site where the thin, flexible tube (catheter) was placed. These should improve  over the next several days. HOME CARE INSTRUCTIONS   Rest for the remainder of the day.  Avoid any heavy lifting (more than 10 lb [4.5 kg]) for at least 3 days.   If your catheter bandage (dressing) becomes soaked with blood, apply firm and direct pressure on the insertion site and sit up for 20 minutes. Your dialysis nurses will change your dressing during your next visit or at your next dialysis treatment.  Keep the dressing around the insertion site dry. Avoid using the shower. You may take a bath after 24 hours. Bathe in a tub and keep the dressing and catheter above the level of the water. Do not submerge catheter underwater, such as by swimming.  You may resume your usual diet.  Do not operate heavy machinery, drive, or make legal decisions for the first 24 hours after the procedure if you were given sedatives or other medicines to help you relax.  SEEK MEDICAL CARE IF:  The catheter gets pulled or comes out.  You develop any signs of infection around the insertion site such as:   Bleeding that does not stop even after applying pressure as instructed.  Increased pain.  Unusual drainage such as pus.   You develop a fever or chills.   You have any other questions or concerns related to your procedure or the care of your central line.  SEEK IMMEDIATE MEDICAL CARE IF:  You develop lightheadedness or dizziness.   You faint.   You develop shortness of breath or difficulty breathing.   You have any symptoms of an allergic reaction, such as:  Itching.   Rash at the site of insertion.   This information is not intended to replace advice given to you by your health care provider. Make sure you discuss any questions you have with your health care provider.   Document Released: 02/13/2004 Document Revised: 07/06/2013 Document Reviewed: 04/09/2012 Elsevier Interactive Patient Education [employer].

## 2015-12-22 NOTE — Progress Notes (Signed)
Pt's vitals are WNL, tolerating her diet and pain is under control. Discussed discharge instructions with both patient and husband. Discharged to come with prescriptions.

## 2015-12-22 NOTE — Discharge Summary (Signed)
Physician Discharge Summary  Patient ID: LORRENE GRAEF MRN: [id number] DOB/AGE: 49-Apr-1967 49 y.o.  Admit date: 12/19/2015 Discharge date: 12/22/2015  Patient Care Team: [name], MD as PCP - General (Family Medicine) Michael Boston, MD as Consulting Physician (General Surgery) Truitt Merle, MD as Consulting Physician (Medical Oncology) Kyung Rudd, MD as Consulting Physician (Radiation Oncology) Teena Irani, MD as Consulting Physician (Gastroenterology)  Admission Diagnoses: Principal Problem:   Abdominal pain Active Problems:   Rectal adenocarcinoma s/p LAR resection 05/25/2015   Ileostomy in place [facility])   Morbid obesity with BMI of 40.0-44.9, adult ([place])   IBS (irritable bowel syndrome)   Depression   Pelvic abscess s/p drainage & omental pedicle flap 11/17/2015   Protein-calorie malnutrition, moderate ([place])   Large bowel delayed anastomotic leak s/p diverting loop ileostomy 11/17/2015   Dehydration   Gastroenteritis   Hydroureter on right   Occlusion of Portacatheter central line   Discharge Diagnoses:  Principal Problem:   Abdominal pain Active Problems:   Rectal adenocarcinoma s/p LAR resection 05/25/2015   Ileostomy in place [facility])   Morbid obesity with BMI of 40.0-44.9, adult ([place])   IBS (irritable bowel syndrome)   Depression   Pelvic abscess s/p drainage & omental pedicle flap 11/17/2015   Protein-calorie malnutrition, moderate ([facility])   Large bowel delayed anastomotic leak s/p diverting loop ileostomy 11/17/2015   Dehydration   Gastroenteritis   Hydroureter on right   Occlusion of Portacatheter central line   POST-OPERATIVE DIAGNOSIS:   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  Irritated acrochordon right lower quadrant panniculus   PROCEDURE 11/17/2015:   LAPAROSCOPIC lysis of adhesions 60 minutes DIVERTING LOOP ILEOSTOMY  DRAINAGE OF PELVIC ABSCESS OMENTAL PEDICLE FLAP TO DEEP PELVIS EXAM UNDER ANESTHESIA WITH DISIMPACTION   EXCISION OF SKIN TAG  Surgeon(s): Michael Boston, MD   Consults: urology  Hospital Course:   The patient underwent the surgery above last month.  Postoperatively, the patient gradually mobilized and advanced to a solid diet.  Pain and other symptoms were treated aggressively.  Went home.  She bounced back with gastroenteritis and dehydration.  Improve.  Patient then had a severe episode of sharp epigastric pain.  Felt not to be easily controlled.  CT scan did not show perforation or massive gastritis.  No bowel obstruction.  No herniation.  Still with pelvic collection with surgical drain in appropriate place.  Still with loop ileostomy but size smaller.  Was admitted.  Placed on IV fluids.  Placed on proton pump inhibitor.  Ultrasound done showed no evidence of any cholecystitis or gallstones.  Normal gallbladder.  Mildly dilated hydronephrosis on the right side.  Urology consultation performed.  Had been noted preoperatively and was slightly larger.  However patient did not have elevated creatinine.  No evidence of stone.  Suspected be transient.  Outpatient follow-up recommended.  Patient's yesterday pain resolved.  Nausea improved.  She advanced on a diet.  Patient had nutritional consultations.  Ileostomy working well with pouching and not leaking.  Therefore held off on wound ostomy nurse evaluation since that it happened numerous times before.  By the time of discharge, the patient was walking well the hallways, eating food, having flatus.  The portacatheter was unclogged and working well.  Not aspirating blood but able to tolerate flushings his infusions without difficulty.  Pain was well-controlled on an oral medications.  Based on meeting discharge criteria and continuing to recover, I felt it was safe for the patient to be discharged from the hospital  to further recover with close followup. Postoperative recommendations were discussed in detail.  They are written as well.   Significant  Diagnostic Studies:  Results for orders placed or performed during the hospital encounter of 12/19/15 (from the past 72 hour(s))  Magnesium     Status: None   Collection Time: 12/19/15 12:57 PM  Result Value Ref Range   Magnesium 1.9 1.7 - 2.4 mg/dL  Lipase, blood     Status: None   Collection Time: 12/19/15  1:00 PM  Result Value Ref Range   Lipase 18 11 - 51 U/L  Comprehensive metabolic panel     Status: Abnormal   Collection Time: 12/19/15  1:00 PM  Result Value Ref Range   Sodium 137 135 - 145 mmol/L   Potassium 3.1 (L) 3.5 - 5.1 mmol/L   Chloride 101 101 - 111 mmol/L   CO2 25 22 - 32 mmol/L   Glucose, Bld 100 (H) 65 - 99 mg/dL   BUN 7 6 - 20 mg/dL   Creatinine, Ser 0.72 0.44 - 1.00 mg/dL   Calcium 9.5 8.9 - 10.3 mg/dL   Total Protein 7.7 6.5 - 8.1 g/dL   Albumin 3.1 (L) 3.5 - 5.0 g/dL   AST 15 15 - 41 U/L   ALT 12 (L) 14 - 54 U/L   Alkaline Phosphatase 108 38 - 126 U/L   Total Bilirubin 0.6 0.3 - 1.2 mg/dL   GFR calc non Af Amer >60 >60 mL/min   GFR calc Af Amer >60 >60 mL/min    Comment: (NOTE) The eGFR has been calculated using the CKD EPI equation. This calculation has not been validated in all clinical situations. eGFR's persistently <60 mL/min signify possible Chronic Kidney Disease.    Anion gap 11 5 - 15  I-Stat Chem 8, ED     Status: Abnormal   Collection Time: 12/19/15  1:18 PM  Result Value Ref Range   Sodium 138 135 - 145 mmol/L   Potassium 3.1 (L) 3.5 - 5.1 mmol/L   Chloride 99 (L) 101 - 111 mmol/L   BUN 4 (L) 6 - 20 mg/dL   Creatinine, Ser 0.70 0.44 - 1.00 mg/dL   Glucose, Bld 99 65 - 99 mg/dL   Calcium, Ion 1.15 1.12 - 1.23 mmol/L   TCO2 24 0 - 100 mmol/L   Hemoglobin 12.9 12.0 - 15.0 g/dL   HCT 38.0 36.0 - 46.0 %  CBC with Differential     Status: Abnormal   Collection Time: 12/19/15  2:51 PM  Result Value Ref Range   WBC 13.0 (H) 4.0 - 10.5 K/uL   RBC 4.31 3.87 - 5.11 MIL/uL   Hemoglobin 12.1 12.0 - 15.0 g/dL   HCT 36.8 36.0 - 46.0 %   MCV  85.4 78.0 - 100.0 fL   MCH 28.1 26.0 - 34.0 pg   MCHC 32.9 30.0 - 36.0 g/dL   RDW 14.4 11.5 - 15.5 %   Platelets 452 (H) 150 - 400 K/uL   Neutrophils Relative % 86 %   Neutro Abs 11.2 (H) 1.7 - 7.7 K/uL   Lymphocytes Relative 6 %   Lymphs Abs 0.8 0.7 - 4.0 K/uL   Monocytes Relative 6 %   Monocytes Absolute 0.8 0.1 - 1.0 K/uL   Eosinophils Relative 2 %   Eosinophils Absolute 0.2 0.0 - 0.7 K/uL   Basophils Relative 0 %   Basophils Absolute 0.0 0.0 - 0.1 K/uL  Urinalysis, Routine w reflex microscopic  Status: Abnormal   Collection Time: 12/19/15  4:52 PM  Result Value Ref Range   Color, Urine YELLOW YELLOW   APPearance CLEAR CLEAR   Specific Gravity, Urine 1.006 1.005 - 1.030   pH 6.0 5.0 - 8.0   Glucose, UA NEGATIVE NEGATIVE mg/dL   Hgb urine dipstick NEGATIVE NEGATIVE   Bilirubin Urine NEGATIVE NEGATIVE   Ketones, ur NEGATIVE NEGATIVE mg/dL   Protein, ur NEGATIVE NEGATIVE mg/dL   Nitrite NEGATIVE NEGATIVE   Leukocytes, UA MODERATE (A) NEGATIVE  Urine microscopic-add on     Status: Abnormal   Collection Time: 12/19/15  4:52 PM  Result Value Ref Range   Squamous Epithelial / LPF 0-5 (A) NONE SEEN   WBC, UA 0-5 0 - 5 WBC/hpf   RBC / HPF 6-30 0 - 5 RBC/hpf   Bacteria, UA FEW (A) NONE SEEN  Urine culture     Status: Abnormal   Collection Time: 12/19/15  4:52 PM  Result Value Ref Range   Specimen Description URINE, CLEAN CATCH    Special Requests NONE    Culture MULTIPLE SPECIES PRESENT, SUGGEST RECOLLECTION (A)    Report Status 12/20/2015 FINAL   POC occult blood, ED RN will collect     Status: None   Collection Time: 12/19/15  5:02 PM  Result Value Ref Range   Fecal Occult Bld NEGATIVE NEGATIVE  Basic metabolic panel     Status: Abnormal   Collection Time: 12/20/15  4:36 AM  Result Value Ref Range   Sodium 137 135 - 145 mmol/L   Potassium 4.2 3.5 - 5.1 mmol/L    Comment: RESULT REPEATED AND VERIFIED DELTA CHECK NOTED NO VISIBLE HEMOLYSIS    Chloride 104 101 - 111  mmol/L   CO2 25 22 - 32 mmol/L   Glucose, Bld 106 (H) 65 - 99 mg/dL   BUN 6 6 - 20 mg/dL   Creatinine, Ser 0.71 0.44 - 1.00 mg/dL   Calcium 9.1 8.9 - 10.3 mg/dL   GFR calc non Af Amer >60 >60 mL/min   GFR calc Af Amer >60 >60 mL/min    Comment: (NOTE) The eGFR has been calculated using the CKD EPI equation. This calculation has not been validated in all clinical situations. eGFR's persistently <60 mL/min signify possible Chronic Kidney Disease.    Anion gap 8 5 - 15  CBC     Status: Abnormal   Collection Time: 12/20/15  4:36 AM  Result Value Ref Range   WBC 10.1 4.0 - 10.5 K/uL   RBC 3.92 3.87 - 5.11 MIL/uL   Hemoglobin 10.7 (L) 12.0 - 15.0 g/dL   HCT 33.0 (L) 36.0 - 46.0 %   MCV 84.2 78.0 - 100.0 fL   MCH 27.3 26.0 - 34.0 pg   MCHC 32.4 30.0 - 36.0 g/dL   RDW 14.7 11.5 - 15.5 %   Platelets 395 150 - 400 K/uL  Differential     Status: Abnormal   Collection Time: 12/20/15  4:36 AM  Result Value Ref Range   Neutrophils Relative % 82 %   Neutro Abs 8.6 (H) 1.7 - 7.7 K/uL   Lymphocytes Relative 8 %   Lymphs Abs 0.8 0.7 - 4.0 K/uL   Monocytes Relative 8 %   Monocytes Absolute 0.8 0.1 - 1.0 K/uL   Eosinophils Relative 2 %   Eosinophils Absolute 0.2 0.0 - 0.7 K/uL   Basophils Relative 0 %   Basophils Absolute 0.0 0.0 - 0.1 K/uL  Hepatic function panel  Status: Abnormal   Collection Time: 12/20/15  4:36 AM  Result Value Ref Range   Total Protein 7.0 6.5 - 8.1 g/dL   Albumin 2.7 (L) 3.5 - 5.0 g/dL   AST 13 (L) 15 - 41 U/L   ALT 9 (L) 14 - 54 U/L   Alkaline Phosphatase 99 38 - 126 U/L   Total Bilirubin 0.6 0.3 - 1.2 mg/dL   Bilirubin, Direct 0.2 0.1 - 0.5 mg/dL   Indirect Bilirubin 0.4 0.3 - 0.9 mg/dL  Magnesium     Status: None   Collection Time: 12/20/15  4:36 AM  Result Value Ref Range   Magnesium 2.1 1.7 - 2.4 mg/dL  CBC     Status: Abnormal   Collection Time: 12/21/15  5:28 AM  Result Value Ref Range   WBC 8.7 4.0 - 10.5 K/uL   RBC 3.53 (L) 3.87 - 5.11 MIL/uL    Hemoglobin 9.7 (L) 12.0 - 15.0 g/dL   HCT 29.9 (L) 36.0 - 46.0 %   MCV 84.7 78.0 - 100.0 fL   MCH 27.5 26.0 - 34.0 pg   MCHC 32.4 30.0 - 36.0 g/dL   RDW 14.7 11.5 - 15.5 %   Platelets 368 150 - 400 K/uL  Basic metabolic panel     Status: Abnormal   Collection Time: 12/21/15  5:28 AM  Result Value Ref Range   Sodium 140 135 - 145 mmol/L   Potassium 4.1 3.5 - 5.1 mmol/L   Chloride 110 101 - 111 mmol/L   CO2 22 22 - 32 mmol/L   Glucose, Bld 103 (H) 65 - 99 mg/dL   BUN <5 (L) 6 - 20 mg/dL   Creatinine, Ser 0.61 0.44 - 1.00 mg/dL   Calcium 8.9 8.9 - 10.3 mg/dL   GFR calc non Af Amer >60 >60 mL/min   GFR calc Af Amer >60 >60 mL/min    Comment: (NOTE) The eGFR has been calculated using the CKD EPI equation. This calculation has not been validated in all clinical situations. eGFR's persistently <60 mL/min signify possible Chronic Kidney Disease.    Anion gap 8 5 - 15    Ct Abdomen Pelvis W Contrast  12/19/2015  CLINICAL DATA:  Abdomen pain for 3 days. Patient has a colostomy with history of colon cancer. Currently undergoing chemotherapy. EXAM: CT ABDOMEN AND PELVIS WITH CONTRAST TECHNIQUE: Multidetector CT imaging of the abdomen and pelvis was performed using the standard protocol following bolus administration of intravenous contrast. CONTRAST:  120m ISOVUE-300 IOPAMIDOL (ISOVUE-300) INJECTION 61% COMPARISON:  Dec 05, 2015 FINDINGS: Lower chest: No acute findings. No focal pneumonia or pleural effusion is identified. The heart size normal. Hepatobiliary: There are stable liver lesions and low densities unchanged compared to prior CT. No new lesions are identified. The gallbladder is normal. Pancreas: No mass, inflammatory changes, or other significant abnormality. Spleen: Within normal limits in size and appearance. Adrenals/Urinary Tract: There is right hydronephrosis and hydroureter without focal discrete obstructing stone identified in the right collecting system. The left kidney is  normal without hydronephrosis. The adrenal glands are normal. The bladder is normal. Stomach/Bowel: A right abdominal ostomy is identified. The previously noted thick walled bowel loops leading up to ostomy are decreased with interval improvement. There is no bowel obstruction. The previously noted pelvic abscess is smaller and her exam measuring 7.1 x 4 cm. There pelvic drainage tube, the position is unchanged but there is increased inflammation surrounding the tube. Vascular/Lymphatic: No pathologically enlarged lymph nodes. No evidence of  abdominal aortic aneurysm. Reproductive: No mass or other significant abnormality. Other:  Small amount of ascites is noted. Musculoskeletal: Degenerative joint changes of the spine are identified. There is anterior listhesis of L4-5. IMPRESSION: The previously noted abscess in the presacral region smaller compared to the prior CT. However, there is interval increase inflammation surrounding the pelvic drainage tube compared to the previous exam.There is right hydronephrosis and right hydroureter probably a 3 obstruction related a inflammation previously described above the in pelvis. Stable liver changes. Electronically Signed   By: Abelardo Diesel M.D.   On: 12/19/2015 16:37   US Abdomen Limited Ruq  12/20/2015  CLINICAL DATA:  Right upper quadrant pain EXAM: US ABDOMEN LIMITED - RIGHT UPPER QUADRANT COMPARISON:  CT scan 12/19/2015 FINDINGS: Gallbladder: No gallstones or wall thickening visualized. No sonographic Murphy sign noted by sonographer. Common bile duct: Diameter: 2.8 mm in diameter within normal limits. Liver: The liver shows normal echogenicity. Stable hemangiomas in right hepatic lobe. IMPRESSION: No gallstones are noted within gallbladder. Normal CBD. No thickening of gallbladder wall. Electronically Signed   By: Lahoma Crocker M.D.   On: 12/20/2015 09:26    Discharge Exam: Blood pressure 125/71, pulse 63, temperature 98.7 F (37.1 C), temperature source Oral,  resp. rate 16, height 5' 10"  (1.778 m), weight 118.842 kg (262 lb), SpO2 99 %.  General: Pt awake/alert/oriented x4 in no major acute distress.  Alert.  Smiling.  Husband at bedside. Eyes: PERRL, normal EOM. Sclera nonicteric Neuro: CN II-XII intact w/o focal sensory/motor deficits. Lymph: No head/neck/groin lymphadenopathy Psych:  No delerium/psychosis/paranoia HENT: Normocephalic, Mucus membranes moist.  No thrush Neck: Supple, No tracheal deviation Chest: No pain.  Good respiratory excursion. CV:  Pulses intact.  Regular rhythm MS: Normal AROM mjr joints.  No obvious deformity Abdomen: Soft, Nondistended.  Nontender.  No incarcerated hernias.  Right upper quadrant ileostomy pouch with thickened effluent and gas.  No epigastric pain anymore. Ext:  SCDs BLE.  No significant edema.  No cyanosis Skin: No petechiae / purpura  Discharged Condition: good   Past Medical History  Diagnosis Date  . Hypertension   . Hypercholesteremia   . Obesity   . Tear of left rotator cuff   . IBS (irritable bowel syndrome)   . Vertigo   . TMJ (dislocation of temporomandibular joint)   . Bulging disc     L5  . Depression   . Rectal bleeding   . Vitamin D deficiency   . Osteoarthritis, knee   . S/P radiation therapy 02/20/15-03/30/15    colon/rectal  . Complication of anesthesia   . PONV (postoperative nausea and vomiting)   . History of mitral valve prolapse   . Mild sleep apnea     does not use CPAP  . History of chemotherapy   . Numbness and tingling     great toe bilat left more than right   . History of frequent ear infections   . History of frequent urinary tract infections     middle school age  . GERD (gastroesophageal reflux disease)   . Cancer ([place])   . Anemia   . History of blood transfusion   . Fibroids   . 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 Heart murmur   . Occasional tremors   . Wears glasses   . Anxiety   . History of hiatal hernia     Past  Surgical History  Procedure Laterality Date  . Rotator cuff repair    .  Eus N/A 01/18/2015    Procedure: LOWER ENDOSCOPIC ULTRASOUND (EUS);  Surgeon: Arta Silence, MD;  Location: [facility];  Service: Endoscopy;  Laterality: N/A;  . Abdominal hysterectomy  1996    Removed uterus, and tubes  . Left knee arthroscopy      mult  . Tonsillectomy    . Dilation and curettage of uterus      times 2  . Xi robotic assisted lower anterior resection N/A 05/25/2015    Procedure: XI ROBOTIC ASSISTED LOWER ANTERIOR RESECTION, , RIGID PROCTOSCOPY, RIGHT OOPHORECTOMY;  Surgeon: Michael Boston, MD;  Location: [facility];  Service: General;  Laterality: N/A;  . Portacath placement N/A 05/25/2015    Procedure: INSERTION PORT-A-CATH;  Surgeon: Michael Boston, MD;  Location: [facility];  Service: General;  Laterality: N/A;  . Jp drain     . Ileo loop colostomy closure N/A 11/17/2015    Procedure: LAPAROSCOPIC DIVERTING LOOP ILEOSTOMY  DRAINAGE OF PELVIC ABSCESS;  Surgeon: Michael Boston, MD;  Location: [facility];  Service: General;  Laterality: N/A;  . Impaction removal  11/17/2015    Procedure: DISIMPACTION REMOVAL;  Surgeon: Michael Boston, MD;  Location: [facility];  Service: General;;  . Excision of skin tag  11/17/2015    Procedure: EXCISION OF SKIN TAG;  Surgeon: Michael Boston, MD;  Location: [facility];  Service: General;;  . Laparoscopic lysis of adhesions  11/17/2015    Procedure: LAPAROSCOPIC LYSIS OF ADHESIONS;  Surgeon: Michael Boston, MD;  Location: [facility];  Service: General;;    Social History   Social History  . Marital Status: Married    Spouse Name: N/A  . Number of Children: N/A  . Years of Education: N/A   Occupational History  . Not on file.   Social History Main Topics  . Smoking status: Former Smoker -- 0.50 packs/day for 7 years    Types: Cigarettes    Quit date: 07/16/1991  . Smokeless tobacco: Never Used     Comment: 1 pack/week intermittently x 7 years  . Alcohol Use: 0.6 oz/week    1 Shots of liquor  per week     Comment: rarely   . Drug Use: No  . Sexual Activity: Not on file   Other Topics Concern  . Not on file   Social History Narrative   Tobacco Use: Cigarettes - Former Smoker   Alcohol: Yes, very rare, liquor.    No recreational drug use   Occupation: Head CMA @ [employer]   Marital Status: Married    Husband: Roselyn Reef Disabled   Children: 2 adopted kids [place]   Religion: [employer] [place]                   Family History  Problem Relation Age of Onset  . Coronary artery disease Mother 21  . Hypertension Mother   . Hyperlipidemia Mother   . Diabetes Mellitus I Mother   . Coronary artery disease Father   . Hyperlipidemia Father   . Hypertension Father   . Cancer Sister     skin - non melanoma  . Hyperlipidemia Brother   . Cancer Maternal Uncle 72    pancreatic with mets to colon and prostate  . Cirrhosis Maternal Uncle   . Hypertension Maternal Grandmother   . Diabetes Mellitus I Maternal Grandmother   . Hyperlipidemia Maternal Grandmother   . CVA Maternal Grandmother   . Hypertension Maternal Grandfather   . Coronary artery disease Maternal Grandfather 65  .  Hyperlipidemia Maternal Grandfather   . Coronary artery disease Paternal Grandmother   . Hypertension Paternal Grandmother   . Hyperlipidemia Paternal Grandmother   . Diabetes Mellitus I Paternal Grandmother   . Hypertension Paternal Grandfather   . Hyperlipidemia Paternal Grandfather   . Coronary artery disease Paternal Grandfather     Current Facility-Administered Medications  Medication Dose Route Frequency Provider Last Rate Last Dose  . acetaminophen (TYLENOL) tablet 500-1,000 mg  500-1,000 mg Oral Q6H PRN Michael Boston, MD      . ciprofloxacin (CIPRO) tablet 500 mg  500 mg Oral BID Michael Boston, MD   500 mg at 12/21/15 2003  . diphenhydrAMINE (BENADRYL) 12.5 MG/5ML elixir 12.5 mg  12.5 mg Oral Q6H PRN Michael Boston, MD      . diphenhydrAMINE (BENADRYL) injection  12.5-25 mg  12.5-25 mg Intravenous Q6H PRN Michael Boston, MD      . DULoxetine (CYMBALTA) DR capsule 60 mg  60 mg Oral QHS Excell Seltzer, MD   60 mg at 12/20/15 2228  . enoxaparin (LOVENOX) injection 40 mg  40 mg Subcutaneous Q24H Excell Seltzer, MD   40 mg at 12/21/15 2137  . hydrALAZINE (APRESOLINE) injection 10 mg  10 mg Intravenous Q2H PRN Michael Boston, MD      . HYDROmorphone (DILAUDID) injection 0.5-2 mg  0.5-2 mg Intravenous Q2H PRN Michael Boston, MD   1 mg at 12/21/15 1235  . HYDROmorphone (DILAUDID) tablet 4-8 mg  4-8 mg Oral Q4H PRN Michael Boston, MD   8 mg at 12/21/15 2003  . hyoscyamine (LEVBID) 0.375 MG 12 hr tablet 0.375 mg  0.375 mg Oral Daily Excell Seltzer, MD   0.375 mg at 12/21/15 1037  . lactated ringers bolus 1,000 mL  1,000 mL Intravenous Q8H PRN Michael Boston, MD      . lactated ringers bolus 2,000 mL  2,000 mL Intravenous Q M,W,F Michael Boston, MD      . LORazepam (ATIVAN) injection 0.5-1 mg  0.5-1 mg Intravenous Q8H PRN Michael Boston, MD      . methocarbamol (ROBAXIN) tablet 500-1,000 mg  500-1,000 mg Oral Q8H PRN Excell Seltzer, MD      . ondansetron (ZOFRAN-ODT) disintegrating tablet 4 mg  4 mg Oral Q6H PRN Michael Boston, MD       Or  . ondansetron [facility]) injection 4 mg  4 mg Intravenous Q6H PRN Michael Boston, MD   4 mg at 12/20/15 1632  . pantoprazole (PROTONIX) EC tablet 80 mg  80 mg Oral Daily Michael Boston, MD   80 mg at 12/21/15 1037  . potassium chloride SA (K-DUR,KLOR-CON) CR tablet 40 mEq  40 mEq Oral BID Excell Seltzer, MD   40 mEq at 12/21/15 1036  . prochlorperazine (COMPAZINE) injection 5-10 mg  5-10 mg Intravenous Q4H PRN Michael Boston, MD   10 mg at 12/21/15 0517  . promethazine (PHENERGAN) injection 6.25-12.5 mg  6.25-12.5 mg Intravenous Q4H PRN Michael Boston, MD   12.5 mg at 12/20/15 2144  . promethazine (PHENERGAN) tablet 25 mg  25 mg Oral Q8H PRN Excell Seltzer, MD      . psyllium (HYDROCIL/METAMUCIL) packet 1 packet  1 packet Oral Daily  Excell Seltzer, MD   1 packet at 12/21/15 0848  . sodium chloride 0.9 % bolus 2,000 mL  2,000 mL Intravenous q morning - 10a Michael Boston, MD   1,000 mL at 12/21/15 1140  . sodium chloride flush (NS) 0.9 % injection 10-40 mL  10-40 mL Intracatheter PRN Michael Boston, MD      .  traMADol (ULTRAM) tablet 50-100 mg  50-100 mg Oral Q6H PRN Michael Boston, MD      . Derrill Memo ON 12/23/2015] Vitamin D (Ergocalciferol) (DRISDOL) capsule 50,000 Units  50,000 Units Oral Once per day on Tue Sat Excell Seltzer, MD         Allergies  Allergen Reactions  . Caine-1 [Lidocaine] Rash    Eyes swell shut; includes all caine drugs except marcaine. EMLA cream OK though (?!)  . Flagyl [Metronidazole] Nausea Only  . Adhesive [Tape] Other (See Comments)    Blisters - can use paper tape  . Iron Nausea Only  . Oxycodone     NIGHTMARES. (tolerates hydrocodone or tramadol better)  . Penicillins Rash    Has patient had a PCN reaction causing immediate rash, facial/tongue/throat swelling, SOB or lightheadedness with hypotension: no Has patient had a PCN reaction causing severe rash involving mucus membranes or skin necrosis: no Has patient had a PCN reaction that required hospitalization no Has patient had a PCN reaction occurring within the last 10 years: no If all of the above answers are "NO", then may proceed with Cephalosporin use.   . Sulfa Antibiotics Rash    Rash & Vomiting    Disposition: 01-Home or Self Care  Discharge Instructions    Call MD for:  extreme fatigue    Complete by:  As directed      Call MD for:  hives    Complete by:  As directed      Call MD for:  persistant nausea and vomiting    Complete by:  As directed      Call MD for:  redness, tenderness, or signs of infection (pain, swelling, redness, odor or green/yellow discharge around incision site)    Complete by:  As directed      Call MD for:  severe uncontrolled pain    Complete by:  As directed      Call MD for:    Complete by:   As directed   Temperature > 101.11F     Diet - low sodium heart healthy    Complete by:  As directed   Start with bland, low residue diet for a few days, then advance to a heart healthy (low fat, high fiber) diet.  If you feel nauseated or constipated, simplify to a liquid only diet for 48 hours until you are feeling better (no more nausea, farting/passing gas, having a bowel movement, etc...).  If you cannot tolerate even drinking liquids, or feeling worse, let your surgeon know or go to the Emergency Department for help.     Discharge instructions    Complete by:  As directed   Please see discharge instruction sheets.   Also refer to any handouts/printouts that may have been given from the [facility] surgery office (if you visited [place] there before surgery) Please call our office if you have any questions or concerns [phone]     Discharge wound care:    Complete by:  As directed   If you have closed incisions: Shower and bathe over these incisions with soap and water every day.  It is OK to wash over the dressings: they are waterproof. Remove all surgical dressings on postoperative day #3.  You do not need to replace dressings over the closed incisions unless you feel more comfortable with a Band-Aid covering it.   If you have an open wound: That requires packing, so please see wound care instructions.   In general, remove all  dressings, wash wound with soap and water and then replace with saline moistened gauze.  Do the dressing change at least every day.    Please call our office [phone] if you have further questions.     Driving Restrictions    Complete by:  As directed   No driving until off narcotics and can safely swerve away without pain during an emergency     Increase activity slowly    Complete by:  As directed   Walk an hour a day.  Use 20-30 minute walks.  When you can walk 30 minutes without difficulty, it is fine to restart low impact/moderate activities such as biking,  jogging, swimming, sexual activity, etc.  Eventually you can increase to unrestricted activity when not feeling pain.  If you feel pain: STOP!Marland Kitchen   Let pain protect you from overdoing it.  Use ice/heat & over-the-counter pain medications to help minimize soreness.  If that is not enough, then use your narcotic pain prescription as needed to remain active.  It is better to take extra pain medications and be more active than to stay bedridden to avoid all pain medications.     Lifting restrictions    Complete by:  As directed   Avoid heavy lifting initially, <20 pounds at first.   Do not push through pain.   You have no specific weight limit: If it hurts to do, DON'T DO IT.    If you feel no pain, you are not injuring anything.  Pain will protect you from injury.   Coughing and sneezing are far more stressful to your incision than any lifting.   Avoid resuming heavy lifting (>50 pounds) or other intense activity until off all narcotic pain medications.   When want to exercise more, give yourself 2 weeks to gradually get back to full intense exercise/activity.     May shower / Bathe    Complete by:  As directed   Shorter.  It is fine for dressings or wounds to be washed/rinsed.  Use gentle soap & water.  This will help the incisions and/or wounds get clean & minimize infection.     May walk up steps    Complete by:  As directed      Sexual Activity Restrictions    Complete by:  As directed   Sexual activity as tolerated.  Do not push through pain.  Pain will protect you from injury.     Walk with assistance    Complete by:  As directed   Walk over an hour a day.  May use a walker/cane/companion to help with balance and stamina.            Medication List    TAKE these medications        ciprofloxacin 500 MG tablet  Commonly known as:  CIPRO  Take 1 tablet (500 mg total) by mouth 2 (two) times daily.     CoQ-10 100 MG Caps  Take 100 mg by mouth daily.     DULoxetine 60 MG  capsule  Commonly known as:  CYMBALTA  Take 60 mg by mouth at bedtime.     esomeprazole 40 MG capsule  Commonly known as:  NEXIUM  Take 1 capsule (40 mg total) by mouth 2 (two) times daily before a meal.     HYDROcodone-acetaminophen 5-325 MG tablet  Commonly known as:  NORCO/VICODIN  Take 1-2 tablets by mouth every 6 (six) hours as needed for moderate pain or severe  pain.     HYDROmorphone 4 MG tablet  Commonly known as:  DILAUDID  Take 1-2 tablets (4-8 mg total) by mouth every 6 (six) hours as needed for moderate pain or severe pain.     hyoscyamine 0.375 MG 12 hr tablet  Commonly known as:  LEVBID  Take 0.375 mg by mouth daily.     lidocaine-prilocaine cream  Commonly known as:  EMLA  Apply to affected area once     meloxicam 15 MG tablet  Commonly known as:  MOBIC  Take 15 mg by mouth daily as needed for pain.     methocarbamol 500 MG tablet  Commonly known as:  ROBAXIN  Take 2 tablets (1,000 mg total) by mouth every 6 (six) hours as needed for muscle spasms.     Normal Saline Flush 0.9 % Soln  Place 5 mLs rectally daily as needed (For peg tube.).     ondansetron 8 MG tablet  Commonly known as:  ZOFRAN  Take 1 tablet (8 mg total) by mouth 2 (two) times daily. Start the day after chemo for 2 days. Then take as needed for nausea or vomiting.     potassium chloride SA 20 MEQ tablet  Commonly known as:  K-DUR,KLOR-CON  Take 40 mEq by mouth 2 (two) times daily.     promethazine 25 MG tablet  Commonly known as:  PHENERGAN  Take 25 mg by mouth every 8 (eight) hours as needed for nausea or vomiting.     psyllium 58.6 % powder  Commonly known as:  METAMUCIL  Take 1 packet by mouth 2 (two) times daily.     traMADol 50 MG tablet  Commonly known as:  ULTRAM  Take 50 mg by mouth 2 (two) times daily as needed for moderate pain. pain     Vitamin D (Ergocalciferol) 50000 units Caps capsule  Commonly known as:  DRISDOL  Take 50,000 Units by mouth 2 (two) times a week.            Follow-up Information    Follow up with [facility] Schedule an appointment as soon as possible for a visit in 2 weeks.   Why:  To follow up on your right hydronephrosis/hydroureter.   Seen by Dr Alinda Money inpatient consultation.  To follow up after your hospital stay   Contact information:   [address] [place] [address] [phone]       Follow up with Maryjane Benedict C., MD In 3 weeks.   Specialty:  General Surgery   Why:  To follow up after your hospital stay   Contact information:   [place] [address] [phone]        Signed: Morton Peters, M.D., F.A.C.S. Gastrointestinal and Minimally Invasive Surgery [facility] [address], [place],  [address] [phone] Main / Paging   12/22/2015, 7:56 AM

## 2015-12-24 NOTE — Progress Notes (Signed)
This encounter was created in error - please disregard.

## 2016-01-02 ENCOUNTER — Other Ambulatory Visit (HOSPITAL_COMMUNITY)
Admission: RE | Admit: 2016-01-02 | Discharge: 2016-01-02 | Disposition: A | Discharge status: Home / Self Care | Payer: BLUE CROSS/BLUE SHIELD | Source: Other Acute Inpatient Hospital | Attending: Surgery | Admitting: Surgery

## 2016-01-02 DIAGNOSIS — C19 Malignant neoplasm of rectosigmoid junction: Secondary | ICD-10-CM | POA: Diagnosis not present

## 2016-01-02 LAB — POTASSIUM: Potassium: 4.1 mmol/L (ref 3.5–5.1)

## 2016-01-02 LAB — CREATININE, SERUM: Creatinine, Ser: 0.54 mg/dL (ref 0.44–1.00)

## 2016-01-04 ENCOUNTER — Other Ambulatory Visit (HOSPITAL_COMMUNITY): Payer: Self-pay | Admitting: Surgery

## 2016-01-04 DIAGNOSIS — C20 Malignant neoplasm of rectum: Secondary | ICD-10-CM

## 2016-01-05 ENCOUNTER — Ambulatory Visit (HOSPITAL_BASED_OUTPATIENT_CLINIC_OR_DEPARTMENT_OTHER): Payer: BLUE CROSS/BLUE SHIELD

## 2016-01-05 ENCOUNTER — Ambulatory Visit (HOSPITAL_COMMUNITY)
Admission: RE | Admit: 2016-01-05 | Discharge: 2016-01-05 | Disposition: A | Discharge status: Home / Self Care | Payer: BLUE CROSS/BLUE SHIELD | Source: Ambulatory Visit | Attending: Surgery | Admitting: Surgery

## 2016-01-05 VITALS — BP 157/86 | HR 102 | Temp 98.5°F | Resp 20

## 2016-01-05 DIAGNOSIS — C20 Malignant neoplasm of rectum: Secondary | ICD-10-CM

## 2016-01-05 DIAGNOSIS — Z452 Encounter for adjustment and management of vascular access device: Secondary | ICD-10-CM | POA: Insufficient documentation

## 2016-01-05 MED ORDER — SODIUM CHLORIDE 0.9 % IV SOLN: INTRAVENOUS | Status: AC

## 2016-01-05 MED ORDER — HEPARIN SOD (PORK) LOCK FLUSH 100 UNIT/ML IV SOLN
500.0000 [IU] | Freq: Once | INTRAVENOUS | Status: AC
  Administered 2016-01-05: 500 [IU] via INTRAVENOUS
  Filled 2016-01-05: qty 5

## 2016-01-05 MED ORDER — ALTEPLASE 2 MG IJ SOLR
2.0000 mg | Freq: Once | INTRAMUSCULAR | Status: DC
  Filled 2016-01-05: qty 2

## 2016-01-05 MED ORDER — SODIUM CHLORIDE 0.9% FLUSH
10.0000 mL | INTRAVENOUS | Status: DC | PRN
  Administered 2016-01-05: 10 mL via INTRAVENOUS
  Filled 2016-01-05: qty 10

## 2016-01-05 MED ORDER — IOPAMIDOL (ISOVUE-300) INJECTION 61%
50.0000 mL | Freq: Once | INTRAVENOUS | Status: AC | PRN
  Administered 2016-01-05: 10 mL via INTRAVENOUS

## 2016-01-05 MED ORDER — SODIUM CHLORIDE 0.9 % IV SOLN
2000.0000 mL | Freq: Once | INTRAVENOUS | Status: AC
  Administered 2016-01-05: 2000 mL via INTRAVENOUS

## 2016-01-05 MED ORDER — ALTEPLASE 2 MG IJ SOLR
2.0000 mg | Freq: Once | INTRAMUSCULAR | Status: AC
  Administered 2016-01-05: 2 mg
  Filled 2016-01-05: qty 2

## 2016-01-05 NOTE — Procedures (Signed)
Fluoro port injection  Shows moderate fibrin sheath along the cath tip in the lower SVC but port is patent   Able to use for treatment or infusion No comp Stable Full report in PACS

## 2016-01-05 NOTE — Progress Notes (Signed)
1420:  TPA aspirated after 30" dwell & got back 2.5 ml blood & unable to get any more.  Flushed with NS 10 cc & unable to get blood return.  Flushed well with NS.  NS added to infuse 2 L over 2 hours per order.

## 2016-01-05 NOTE — Patient Instructions (Signed)

## 2016-01-09 ENCOUNTER — Telehealth: Payer: Self-pay | Admitting: *Deleted

## 2016-01-09 NOTE — Telephone Encounter (Signed)
Called [facility] & informed that Dr Burr Medico is OK for fluids twice weekly & will have schedulers call her to schedule & also set up appt with Dr Burr Medico.  She will let pt know.

## 2016-01-09 NOTE — Telephone Encounter (Signed)
Received call from [facility] asking if pt can come here for fluids twice weekly.  Pt has [place] wants to d/c it & get fluids here because she feels trapped at home since they give her fluids over such a long time.  She came here last week & had 2 liters over 2 hours & tol well.  Dr Johney [place] is trying to wean her from these IVF but everytime he does something else happens & she ends up in the Ed with dehydration.  She will send Dr Johney [place]' OV note & her call back # is [phone].  Message to Dr Burr Medico.

## 2016-01-10 ENCOUNTER — Telehealth: Payer: Self-pay | Admitting: Hematology

## 2016-01-10 ENCOUNTER — Telehealth: Payer: Self-pay | Admitting: *Deleted

## 2016-01-10 NOTE — Telephone Encounter (Signed)
spoke w/ pt confirmed 6/30 & 7/1 apt times

## 2016-01-10 NOTE — Telephone Encounter (Signed)
Per staff message and POF I have scheduled appts. Advised scheduler of appts. JMW  

## 2016-01-12 ENCOUNTER — Ambulatory Visit (HOSPITAL_BASED_OUTPATIENT_CLINIC_OR_DEPARTMENT_OTHER): Payer: BLUE CROSS/BLUE SHIELD

## 2016-01-12 ENCOUNTER — Other Ambulatory Visit: Payer: Self-pay | Admitting: Hematology

## 2016-01-12 ENCOUNTER — Other Ambulatory Visit (HOSPITAL_BASED_OUTPATIENT_CLINIC_OR_DEPARTMENT_OTHER): Payer: BLUE CROSS/BLUE SHIELD

## 2016-01-12 VITALS — BP 130/75 | HR 104 | Temp 98.9°F | Resp 20

## 2016-01-12 DIAGNOSIS — C20 Malignant neoplasm of rectum: Secondary | ICD-10-CM

## 2016-01-12 DIAGNOSIS — D638 Anemia in other chronic diseases classified elsewhere: Secondary | ICD-10-CM

## 2016-01-12 LAB — COMPREHENSIVE METABOLIC PANEL
ALK PHOS: 105 U/L (ref 40–150)
ALT: 11 U/L (ref 0–55)
AST: 10 U/L (ref 5–34)
Albumin: 2.8 g/dL — ABNORMAL LOW (ref 3.5–5.0)
Anion Gap: 10 mEq/L (ref 3–11)
BILIRUBIN TOTAL: 0.35 mg/dL (ref 0.20–1.20)
BUN: 10.1 mg/dL (ref 7.0–26.0)
CO2: 26 meq/L (ref 22–29)
Calcium: 10.5 mg/dL — ABNORMAL HIGH (ref 8.4–10.4)
Chloride: 103 mEq/L (ref 98–109)
Creatinine: 0.7 mg/dL (ref 0.6–1.1)
EGFR: >90 mL/min/{1.73_m2} (ref >90)
GLUCOSE: 129 mg/dL (ref 70–140)
POTASSIUM: 4.2 meq/L (ref 3.5–5.1)
SODIUM: 139 meq/L (ref 136–145)
Total Protein: 7.8 g/dL (ref 6.4–8.3)

## 2016-01-12 LAB — CBC WITH DIFFERENTIAL/PLATELET
BASO%: 0.6 % (ref 0.0–2.0)
Basophils Absolute: 0.1 10*3/uL (ref 0.0–0.1)
EOS ABS: 0.5 10*3/uL (ref 0.0–0.5)
EOS%: 4.9 % (ref 0.0–7.0)
HEMATOCRIT: 35.8 % (ref 34.8–46.6)
HGB: 11.5 g/dL — ABNORMAL LOW (ref 11.6–15.9)
LYMPH#: 0.6 10*3/uL — AB (ref 0.9–3.3)
LYMPH%: 6.4 % — ABNORMAL LOW (ref 14.0–49.7)
MCH: 27.9 pg (ref 25.1–34.0)
MCHC: 32.2 g/dL (ref 31.5–36.0)
MCV: 86.6 fL (ref 79.5–101.0)
MONO#: 0.5 10*3/uL (ref 0.1–0.9)
MONO%: 4.8 % (ref 0.0–14.0)
NEUT%: 83.3 % — AB (ref 38.4–76.8)
NEUTROS ABS: 8.1 10*3/uL — AB (ref 1.5–6.5)
PLATELETS: 319 10*3/uL (ref 145–400)
RBC: 4.14 10*6/uL (ref 3.70–5.45)
RDW: 17.1 % — ABNORMAL HIGH (ref 11.2–14.5)
WBC: 9.8 10*3/uL (ref 3.9–10.3)

## 2016-01-12 LAB — MAGNESIUM: Magnesium: 2 mg/dl (ref 1.5–2.5)

## 2016-01-12 LAB — IRON AND TIBC
%SAT: 7 % — AB (ref 21–57)
Iron: 16 ug/dL — ABNORMAL LOW (ref 41–142)
TIBC: 224 ug/dL — ABNORMAL LOW (ref 236–444)
UIBC: 208 ug/dL (ref 120–384)

## 2016-01-12 MED ORDER — SODIUM CHLORIDE 0.9 % IJ SOLN
10.0000 mL | INTRAMUSCULAR | Status: DC | PRN
Start: 2016-01-12 — End: 2016-01-12
  Administered 2016-01-12: 10 mL
  Filled 2016-01-12: qty 10

## 2016-01-12 MED ORDER — HEPARIN SOD (PORK) LOCK FLUSH 100 UNIT/ML IV SOLN
250.0000 [IU] | Freq: Once | INTRAVENOUS | Status: DC | PRN
  Filled 2016-01-12: qty 5

## 2016-01-12 MED ORDER — SODIUM CHLORIDE 0.9 % IV SOLN
Freq: Once | INTRAVENOUS | Status: AC
  Administered 2016-01-12: 10:00:00 via INTRAVENOUS

## 2016-01-12 MED ORDER — HEPARIN SOD (PORK) LOCK FLUSH 100 UNIT/ML IV SOLN
500.0000 [IU] | Freq: Once | INTRAVENOUS | Status: AC | PRN
  Administered 2016-01-12: 500 [IU]
  Filled 2016-01-12: qty 5

## 2016-01-12 NOTE — Patient Instructions (Signed)
Dehydration, Adult Dehydration means your body does not have as much fluid or water as it needs. It happens when you take in less fluid than you lose. Your kidneys, brain, and heart will not work properly without the right amount of fluids.  Dehydration can range from mild to severe. It should be treated right away to help prevent it from becoming severe. HOME CARE  Drink enough fluid to keep your pee (urine) clear or pale yellow.  Drink water or fluid slowly by taking small sips. You can also try sucking on ice cubes.  Have food or drinks that contain electrolytes. Examples include bananas and sports drinks.  Take over-the-counter and prescription medicines only as told by your doctor.  Prepare oral rehydration solution (ORS) according to the instructions that came with it. Take sips of ORS every 5 minutes until your pee returns to normal.  If you are throwing up (vomiting) or have watery poop (diarrhea), keep trying to drink water, ORS, or both.  If you have watery poop, avoid:  Drinks with caffeine.  Fruit juice.  Milk.  Carbonated soft drinks.  Do not take salt tablets. This can lead to having too much sodium in your body (hypernatremia). GET HELP IF:  You cannot eat or drink without throwing up.  You have had mild watery poop for longer than 24 hours.  You have a fever. GET HELP RIGHT AWAY IF:   You have very strong thirst.  You have very bad watery poop.  You have not peed in 6-8 hours, or you have peed only a small amount of very dark pee.  You have shriveled skin.  You are dizzy, confused, or both.   This information is not intended to replace advice given to you by your health care provider. Make sure you discuss any questions you have with your health care provider.   Document Released: 04/27/2009 Document Revised: 03/22/2015 Document Reviewed: 11/16/2014 Elsevier Interactive Patient Education [employer].

## 2016-01-12 NOTE — Progress Notes (Signed)
Ambulates well; no signs symptoms of dizziness noted; post vital signs stable.

## 2016-01-13 ENCOUNTER — Ambulatory Visit (HOSPITAL_BASED_OUTPATIENT_CLINIC_OR_DEPARTMENT_OTHER): Payer: No Typology Code available for payment source

## 2016-01-13 VITALS — BP 132/75 | HR 73 | Temp 98.1°F | Resp 19

## 2016-01-13 DIAGNOSIS — D638 Anemia in other chronic diseases classified elsewhere: Secondary | ICD-10-CM | POA: Diagnosis not present

## 2016-01-13 MED ORDER — SODIUM CHLORIDE 0.9 % IV SOLN
Freq: Once | INTRAVENOUS | Status: AC
  Administered 2016-01-13: 10:00:00 via INTRAVENOUS

## 2016-01-13 MED ORDER — HEPARIN SOD (PORK) LOCK FLUSH 100 UNIT/ML IV SOLN
500.0000 [IU] | Freq: Once | INTRAVENOUS | Status: AC | PRN
  Administered 2016-01-13: 500 [IU]
  Filled 2016-01-13: qty 5

## 2016-01-13 MED ORDER — SODIUM CHLORIDE 0.9 % IJ SOLN
10.0000 mL | INTRAMUSCULAR | Status: DC | PRN
  Administered 2016-01-13: 10 mL
  Filled 2016-01-13: qty 10

## 2016-01-13 NOTE — Patient Instructions (Signed)
Dehydration, Adult Dehydration is when you lose more fluids from the body than you take in. Vital organs like the kidneys, brain, and heart cannot function without a proper amount of fluids and salt. Any loss of fluids from the body can cause dehydration.  CAUSES   Vomiting.  Diarrhea.  Excessive sweating.  Excessive urine output.  Fever. SYMPTOMS  Mild dehydration  Thirst.  Dry lips.  Slightly dry mouth. Moderate dehydration  Very dry mouth.  Sunken eyes.  Skin does not bounce back quickly when lightly pinched and released.  Dark urine and decreased urine production.  Decreased tear production.  Headache. Severe dehydration  Very dry mouth.  Extreme thirst.  Rapid, weak pulse (more than 100 beats per minute at rest).  Cold hands and feet.  Not able to sweat in spite of heat and temperature.  Rapid breathing.  Blue lips.  Confusion and lethargy.  Difficulty being awakened.  Minimal urine production.  No tears. DIAGNOSIS  Your caregiver will diagnose dehydration based on your symptoms and your exam. Blood and urine tests will help confirm the diagnosis. The diagnostic evaluation should also identify the cause of dehydration. TREATMENT  Treatment of mild or moderate dehydration can often be done at home by increasing the amount of fluids that you drink. It is best to drink small amounts of fluid more often. Drinking too much at one time can make vomiting worse. Refer to the home care instructions below. Severe dehydration needs to be treated at the hospital where you will probably be given intravenous (IV) fluids that contain water and electrolytes. HOME CARE INSTRUCTIONS   Ask your caregiver about specific rehydration instructions.  Drink enough fluids to keep your urine clear or pale yellow.  Drink small amounts frequently if you have nausea and vomiting.  Eat as you normally do.  Avoid:  Foods or drinks high in sugar.  Carbonated  drinks.  Juice.  Extremely hot or cold fluids.  Drinks with caffeine.  Fatty, greasy foods.  Alcohol.  Tobacco.  Overeating.  Gelatin desserts.  Wash your hands well to avoid spreading bacteria and viruses.  Only take over-the-counter or prescription medicines for pain, discomfort, or fever as directed by your caregiver.  Ask your caregiver if you should continue all prescribed and over-the-counter medicines.  Keep all follow-up appointments with your caregiver. SEEK MEDICAL CARE IF:  You have abdominal pain and it increases or stays in one area (localizes).  You have a rash, stiff neck, or severe headache.  You are irritable, sleepy, or difficult to awaken.  You are weak, dizzy, or extremely thirsty. SEEK IMMEDIATE MEDICAL CARE IF:   You are unable to keep fluids down or you get worse despite treatment.  You have frequent episodes of vomiting or diarrhea.  You have blood or green matter (bile) in your vomit.  You have blood in your stool or your stool looks black and tarry.  You have not urinated in 6 to 8 hours, or you have only urinated a small amount of very dark urine.  You have a fever.  You faint. MAKE SURE YOU:   Understand these instructions.  Will watch your condition.  Will get help right away if you are not doing well or get worse. Document Released: 07/01/2005 Document Revised: 09/23/2011 Document Reviewed: 02/18/2011 [facility] Patient Information 2015 [place], [place]. This information is not intended to replace advice given to you by your health care provider. Make sure you discuss any questions you have with your health care  provider.

## 2016-01-17 ENCOUNTER — Ambulatory Visit (HOSPITAL_BASED_OUTPATIENT_CLINIC_OR_DEPARTMENT_OTHER): Payer: No Typology Code available for payment source

## 2016-01-17 VITALS — BP 135/76 | HR 102 | Temp 98.4°F | Resp 18

## 2016-01-17 DIAGNOSIS — D638 Anemia in other chronic diseases classified elsewhere: Secondary | ICD-10-CM | POA: Diagnosis not present

## 2016-01-17 DIAGNOSIS — D509 Iron deficiency anemia, unspecified: Secondary | ICD-10-CM

## 2016-01-17 MED ORDER — ALTEPLASE 2 MG IJ SOLR
2.0000 mg | Freq: Once | INTRAMUSCULAR | Status: DC | PRN
  Filled 2016-01-17: qty 2

## 2016-01-17 MED ORDER — SODIUM CHLORIDE 0.9 % IV SOLN
1000.0000 mL | Freq: Once | INTRAVENOUS | Status: AC
  Administered 2016-01-17: 1000 mL via INTRAVENOUS

## 2016-01-17 MED ORDER — HEPARIN SOD (PORK) LOCK FLUSH 100 UNIT/ML IV SOLN
250.0000 [IU] | Freq: Once | INTRAVENOUS | Status: DC | PRN
Start: 2016-01-17 — End: 2016-01-17
  Filled 2016-01-17: qty 5

## 2016-01-17 MED ORDER — SODIUM CHLORIDE 0.9 % IJ SOLN
3.0000 mL | Freq: Once | INTRAMUSCULAR | Status: DC | PRN
  Filled 2016-01-17: qty 10

## 2016-01-17 MED ORDER — SODIUM CHLORIDE 0.9 % IJ SOLN
10.0000 mL | INTRAMUSCULAR | Status: DC | PRN
  Administered 2016-01-17: 10 mL
  Filled 2016-01-17: qty 10

## 2016-01-17 MED ORDER — SODIUM CHLORIDE 0.9 % IV SOLN: 510.0000 mg | Freq: Once | INTRAVENOUS | Status: DC

## 2016-01-17 MED ORDER — SODIUM CHLORIDE 0.9 % IJ SOLN
10.0000 mL | INTRAMUSCULAR | Status: DC | PRN
  Filled 2016-01-17: qty 10

## 2016-01-17 MED ORDER — HEPARIN SOD (PORK) LOCK FLUSH 100 UNIT/ML IV SOLN
500.0000 [IU] | Freq: Once | INTRAVENOUS | Status: AC | PRN
  Administered 2016-01-17: 500 [IU]
  Filled 2016-01-17: qty 5

## 2016-01-17 MED ORDER — SODIUM CHLORIDE 0.9 % IV SOLN: Freq: Once | INTRAVENOUS | Status: DC

## 2016-01-17 NOTE — Progress Notes (Signed)
1015- Pt VSS upon discharge. Pt wants to remain accessed with her port. Flushed with blood return noted. Pt has antimicrobial disc in pac in place with sorbaview dressing.  Pt to come back on Friday for infusion appt.

## 2016-01-17 NOTE — Patient Instructions (Signed)

## 2016-01-19 ENCOUNTER — Other Ambulatory Visit (HOSPITAL_BASED_OUTPATIENT_CLINIC_OR_DEPARTMENT_OTHER): Payer: No Typology Code available for payment source

## 2016-01-19 ENCOUNTER — Ambulatory Visit (HOSPITAL_BASED_OUTPATIENT_CLINIC_OR_DEPARTMENT_OTHER): Payer: No Typology Code available for payment source

## 2016-01-19 VITALS — BP 131/90 | HR 94 | Temp 98.0°F | Resp 16

## 2016-01-19 DIAGNOSIS — D638 Anemia in other chronic diseases classified elsewhere: Secondary | ICD-10-CM

## 2016-01-19 DIAGNOSIS — C20 Malignant neoplasm of rectum: Secondary | ICD-10-CM

## 2016-01-19 DIAGNOSIS — E860 Dehydration: Secondary | ICD-10-CM | POA: Diagnosis not present

## 2016-01-19 LAB — COMPREHENSIVE METABOLIC PANEL
ALT: 12 U/L (ref 0–55)
ANION GAP: 14 meq/L — AB (ref 3–11)
AST: 11 U/L (ref 5–34)
Albumin: 3.2 g/dL — ABNORMAL LOW (ref 3.5–5.0)
Alkaline Phosphatase: 103 U/L (ref 40–150)
BUN: 8 mg/dL (ref 7.0–26.0)
CHLORIDE: 103 meq/L (ref 98–109)
CO2: 25 meq/L (ref 22–29)
CREATININE: 0.8 mg/dL (ref 0.6–1.1)
Calcium: 11 mg/dL — ABNORMAL HIGH (ref 8.4–10.4)
EGFR: 89 mL/min/{1.73_m2} — ABNORMAL LOW (ref >90)
Glucose: 134 mg/dl (ref 70–140)
Potassium: 3.9 mEq/L (ref 3.5–5.1)
Sodium: 142 mEq/L (ref 136–145)
Total Bilirubin: <0.3 mg/dL (ref 0.20–1.20)
Total Protein: 8.4 g/dL — ABNORMAL HIGH (ref 6.4–8.3)

## 2016-01-19 LAB — CBC WITH DIFFERENTIAL/PLATELET
BASO%: 0.4 % (ref 0.0–2.0)
Basophils Absolute: 0 10*3/uL (ref 0.0–0.1)
EOS ABS: 0.3 10*3/uL (ref 0.0–0.5)
EOS%: 3.8 % (ref 0.0–7.0)
HCT: 37.9 % (ref 34.8–46.6)
HGB: 12.1 g/dL (ref 11.6–15.9)
LYMPH%: 11.1 % — AB (ref 14.0–49.7)
MCH: 27.6 pg (ref 25.1–34.0)
MCHC: 31.9 g/dL (ref 31.5–36.0)
MCV: 86.3 fL (ref 79.5–101.0)
MONO#: 0.3 10*3/uL (ref 0.1–0.9)
MONO%: 4.3 % (ref 0.0–14.0)
NEUT%: 80.4 % — ABNORMAL HIGH (ref 38.4–76.8)
NEUTROS ABS: 6.1 10*3/uL (ref 1.5–6.5)
Platelets: 435 10*3/uL — ABNORMAL HIGH (ref 145–400)
RBC: 4.39 10*6/uL (ref 3.70–5.45)
RDW: 15 % — ABNORMAL HIGH (ref 11.2–14.5)
WBC: 7.6 10*3/uL (ref 3.9–10.3)
lymph#: 0.9 10*3/uL (ref 0.9–3.3)

## 2016-01-19 LAB — MAGNESIUM: MAGNESIUM: 1.8 mg/dL (ref 1.5–2.5)

## 2016-01-19 MED ORDER — SODIUM CHLORIDE 0.9 % IV SOLN
Freq: Once | INTRAVENOUS | Status: AC
  Administered 2016-01-19: 09:00:00 via INTRAVENOUS

## 2016-01-19 MED ORDER — SODIUM CHLORIDE 0.9 % IJ SOLN
10.0000 mL | INTRAMUSCULAR | Status: DC | PRN
  Administered 2016-01-19: 10 mL
  Filled 2016-01-19: qty 10

## 2016-01-19 MED ORDER — HEPARIN SOD (PORK) LOCK FLUSH 100 UNIT/ML IV SOLN
500.0000 [IU] | Freq: Once | INTRAVENOUS | Status: AC | PRN
  Administered 2016-01-19: 500 [IU]
  Filled 2016-01-19: qty 5

## 2016-01-19 NOTE — Patient Instructions (Signed)

## 2016-01-20 LAB — CEA: CEA1: 1.9 ng/mL (ref 0.0–4.7)

## 2016-01-22 ENCOUNTER — Telehealth: Payer: Self-pay | Admitting: Hematology

## 2016-01-22 ENCOUNTER — Ambulatory Visit (HOSPITAL_BASED_OUTPATIENT_CLINIC_OR_DEPARTMENT_OTHER): Payer: No Typology Code available for payment source

## 2016-01-22 ENCOUNTER — Ambulatory Visit (HOSPITAL_BASED_OUTPATIENT_CLINIC_OR_DEPARTMENT_OTHER): Payer: No Typology Code available for payment source | Admitting: Hematology

## 2016-01-22 ENCOUNTER — Encounter: Payer: Self-pay | Admitting: Hematology

## 2016-01-22 VITALS — BP 133/99 | HR 95 | Temp 98.4°F | Resp 18 | Ht 70.0 in | Wt 269.9 lb

## 2016-01-22 DIAGNOSIS — D638 Anemia in other chronic diseases classified elsewhere: Secondary | ICD-10-CM

## 2016-01-22 DIAGNOSIS — C20 Malignant neoplasm of rectum: Secondary | ICD-10-CM | POA: Diagnosis not present

## 2016-01-22 DIAGNOSIS — IMO0002 Reserved for concepts with insufficient information to code with codable children: Secondary | ICD-10-CM

## 2016-01-22 DIAGNOSIS — F329 Major depressive disorder, single episode, unspecified: Secondary | ICD-10-CM | POA: Diagnosis not present

## 2016-01-22 DIAGNOSIS — K658 Other peritonitis: Secondary | ICD-10-CM | POA: Diagnosis not present

## 2016-01-22 DIAGNOSIS — Z6841 Body Mass Index (BMI) 40.0 and over, adult: Secondary | ICD-10-CM

## 2016-01-22 DIAGNOSIS — F32A Depression, unspecified: Secondary | ICD-10-CM

## 2016-01-22 DIAGNOSIS — I10 Essential (primary) hypertension: Secondary | ICD-10-CM

## 2016-01-22 MED ORDER — SODIUM CHLORIDE 0.9 % IJ SOLN
10.0000 mL | INTRAMUSCULAR | Status: DC | PRN
  Administered 2016-01-22: 10 mL
  Filled 2016-01-22: qty 10

## 2016-01-22 MED ORDER — HEPARIN SOD (PORK) LOCK FLUSH 100 UNIT/ML IV SOLN
500.0000 [IU] | Freq: Once | INTRAVENOUS | Status: AC | PRN
  Administered 2016-01-22: 500 [IU]
  Filled 2016-01-22: qty 5

## 2016-01-22 MED ORDER — SODIUM CHLORIDE 0.9 % IV SOLN
Freq: Once | INTRAVENOUS | Status: AC
  Administered 2016-01-22: 12:00:00 via INTRAVENOUS

## 2016-01-22 NOTE — Patient Instructions (Signed)

## 2016-01-22 NOTE — Progress Notes (Signed)
[place]  Telephone:([phone]) [phone] Fax:(336) [phone]  Clinic Follow Up Note   Patient Care Team: [name], MD as PCP - General (Family Medicine) Michael Boston, MD as Consulting Physician (General Surgery) Truitt Merle, MD as Consulting Physician (Medical Oncology) Kyung Rudd, MD as Consulting Physician (Radiation Oncology) Teena Irani, MD as Consulting Physician (Gastroenterology) Raynelle Bring, MD as Consulting Physician (Urology) 01/22/2016  CHIEF COMPLAINTS:  Follow up rectal cancer   Oncology History   Rectal adenocarcinoma   Staging form: Colon and Rectum, AJCC 7th Edition     Clinical: T3, N2, M0 - Unsigned       Rectal adenocarcinoma s/p LAR resection 05/25/2015   01/13/2015 Initial Diagnosis Rectal adenocarcinoma   01/13/2015 Procedure Colonoscopy showed a sensitivity O nonobstructing mass in the rectum and from 12-18 cm proximal to the Annis. The mass was circumferential, measuring about 6 cm in length. EGD was negative.   01/17/2015 Tumor Marker CEA 1.4, CA-19-9 8, MMR normal    01/18/2015 Procedure Lower endoscopic ultrasound by Dr. Paulita Fujita showed a T3 N2 rectal mass.   01/20/2015 Imaging CT abdomen and pelvis with contrast showed right lateral rectal wall exophytic mass, and 2 ill-defined hepatic hypoenhancing lesions which appears to correspond to the lesion seen on the prior MRI in 2015.   02/05/2015 Imaging abdomen MRI showed 2 hemangioma, no suspicion for metastatic disease. CT chest was negative   02/20/2015 - 03/30/2015 Radiation Therapy neoadjuvant RT to rectal cancer    02/20/2015 Concurrent Chemotherapy capecitabine 2500 mg in the morning and 2000 mg in the evening (851m/m2, bid), on the day of radiation.    05/25/2015 Surgery Recto-sigmoid segmental resection, margins are negative    05/25/2015 Pathology Results 0.2cm residual invasive adenocarcinoma, G2, negative margins, 12 nodes were negative    06/23/2015 - 06/29/2015 Hospital Admission Patient was  admitted for pelvic abscess, drain placed and she was treated with IV antibiotics, she also received 1 dose of Feraheme for anemia.   09/15/2015 - 09/17/2015 Hospital Admission Recurrent pelvic collection s/p perc drainage 09/16/2015    HISTORY OF PRESENTING ILLNESS:  Lindsay GRUENBERG89y.o. female is here because of evaluation for management for newly diagnosed rectal adenocarcinoma.   On 09/06/13, she presented to Dr. HStarr SinclairGI] with 466-monthistory of diarrhea and worsening abdominal pain. She has a history of chronic abdominal pain and had been previously [place] by Dr. HaAmedeo PlentyAbdominal [place]/6/15 noted to have some hypoechoicity in the left hepatic lobe which was confirmed on MRI to be two hemangiomas  in the right hepatic lobe of measuring up to 3.0 cm along with an additional 7 mm probable cyst.   She presented with worsening of diarrhea for 6 month and was reevaluated by Dr. HaAmedeo PlentyShe was seen on 12/06/14 at which time it was decided she would undergo HIDA scan given that the pain was intermittently in the RUQ in addition to upper and lower endoscopy. HIDA scan 12/15/14 was reassuring, but she was found to have a malignant tumor of the rectum about 12-18 cm proximal to the anus, gastritis, gastric polyps on endoscopies 01/13/15. Biopsies for these three specimens along with the duodenum were collected and resulted on 01/17/15 for invasive adenocarcinoma, chronic gastritis [H. Pylori negative], fundic gland polyps, and non-specific mild patchy intra-epithelial lymphocytes. Endoscopic [place]/6/16 staged the mass at T3N2Mx and she is here today for consideration of neoadjuvant  therapy.    Today, she does report abdominal pain but is no different than her baseline [right-sided, upper, lower] but has had worsening appetite over the last 2 months. She does have diarrhea with bowel movements every 1-2 hours, especially  after eating, and has been noticing bleeding with her bowel movement following her endoscopic evaluation. She also feels her energy level has decreased. Otherwise, she denies nausea, chest pain, shortness of breath.    Family history is notable for a maternal uncle who had pancreatic cancer diagnosed in his mid to late 48s that spread to the colon and prostate before he died about 100 years later and a sister with non-melanoma skin cancer. 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 She smoked roughly 20 pack years [1 pack/week x 7 years] in the past and reports occasional alcohol use but denies any prior illicit drug use.  She lost about 7 lbs in the past 2 months  CURRENT THERAPY: supportive care, recovering from surgery    [facility] returns for follow-up. She was last admitted to hospital in early June, and underwent laparoscopic lysis of adhesion, diverging loop ileostomy, and the drainage of pelvic abscess. The drainage tube was finally removed about 10 days ago. She has been receiving IV fluids from home care nurse 3 times a work since her hospital discharge, she still has intermittent increased ileostomy output, but is output has been normal most of time. Her stool is loose, sometimes slightly formed, she empties 3-4 times a day. She is able to keep fluids down, but appetite is low, she small meals. She has lost 15 pounds in the past 3-4 months. No fever or chills.    HISTORY:  Past Medical History  Diagnosis Date  . Hypertension   . Hypercholesteremia   . Obesity   . Tear of left rotator cuff   . IBS (irritable bowel syndrome)   . Vertigo   . TMJ (dislocation of temporomandibular joint)   . Bulging disc     L5  . Depression   . Rectal bleeding   . Vitamin D deficiency   . Osteoarthritis, knee   . S/P radiation therapy 02/20/15-03/30/15    colon/rectal  . Complication of anesthesia   . PONV (postoperative nausea and  vomiting)   . History of mitral valve prolapse   . Mild sleep apnea     does not use CPAP  . History of chemotherapy   . Numbness and tingling     great toe bilat left more than right   . History of frequent ear infections   . History of frequent urinary tract infections     middle school age  . GERD (gastroesophageal reflux disease)   . Cancer ([place])   . Anemia   . History of blood transfusion   . Fibroids   . 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 Heart murmur   . Occasional tremors   . Wears glasses   . Anxiety   . History of hiatal hernia     SURGICAL HISTORY: Past Surgical History  Procedure Laterality Date  . Rotator cuff repair    . Eus N/A 01/18/2015    Procedure: LOWER ENDOSCOPIC ULTRASOUND (EUS);  Surgeon: Arta Silence, MD;  Location: [facility];  Service: Endoscopy;  Laterality: N/A;  . Abdominal hysterectomy  1996    Removed uterus, and tubes  . Left knee arthroscopy  mult  . Tonsillectomy    . Dilation and curettage of uterus      times 2  . Xi robotic assisted lower anterior resection N/A 05/25/2015    Procedure: XI ROBOTIC ASSISTED LOWER ANTERIOR RESECTION, , RIGID PROCTOSCOPY, RIGHT OOPHORECTOMY;  Surgeon: Michael Boston, MD;  Location: [facility];  Service: General;  Laterality: N/A;  . Portacath placement N/A 05/25/2015    Procedure: INSERTION PORT-A-CATH;  Surgeon: Michael Boston, MD;  Location: [facility];  Service: General;  Laterality: N/A;  . Jp drain     . Ileo loop colostomy closure N/A 11/17/2015    Procedure: LAPAROSCOPIC DIVERTING LOOP ILEOSTOMY  DRAINAGE OF PELVIC ABSCESS;  Surgeon: Michael Boston, MD;  Location: [facility];  Service: General;  Laterality: N/A;  . Impaction removal  11/17/2015    Procedure: DISIMPACTION REMOVAL;  Surgeon: Michael Boston, MD;  Location: [facility];  Service: General;;  . Excision of skin tag  11/17/2015    Procedure: EXCISION OF SKIN TAG;  Surgeon: Michael Boston, MD;  Location: [facility];  Service: General;;  .  Laparoscopic lysis of adhesions  11/17/2015    Procedure: LAPAROSCOPIC LYSIS OF ADHESIONS;  Surgeon: Michael Boston, MD;  Location: [facility];  Service: General;;    SOCIAL HISTORY: Social History   Social History  . Marital Status: Married    Spouse Name: N/A  . Number of Children: N/A  . Years of Education: N/A   Occupational History  . Not on file.   Social History Main Topics  . Smoking status: Former Smoker -- 0.50 packs/day for 7 years    Types: Cigarettes    Quit date: 07/16/1991  . Smokeless tobacco: Never Used     Comment: 1 pack/week intermittently x 7 years  . Alcohol Use: 0.6 oz/week    1 Shots of liquor per week     Comment: rarely   . Drug Use: No  . Sexual Activity: Not on file   Other Topics Concern  . Not on file   Social History Narrative   Tobacco Use: Cigarettes - Former Smoker   Alcohol: Yes, very rare, liquor.    No recreational drug use   Occupation: Head CMA @ [employer]   Marital Status: Married    Husband: Roselyn Reef Disabled   Children: 2 adopted kids [place]   Religion: [employer] [place] HISTORY: Family History  Problem Relation Age of Onset  . Coronary artery disease Mother 36  . Hypertension Mother   . Hyperlipidemia Mother   . Diabetes Mellitus I Mother   . Coronary artery disease Father   . Hyperlipidemia Father   . Hypertension Father   . Cancer Sister     skin - non melanoma  . Hyperlipidemia Brother   . Cancer Maternal Uncle 58    pancreatic with mets to colon and prostate  . Cirrhosis Maternal Uncle   . Hypertension Maternal Grandmother   . Diabetes Mellitus I Maternal Grandmother   . Hyperlipidemia Maternal Grandmother   . CVA Maternal Grandmother   . Hypertension Maternal Grandfather   . Coronary artery disease Maternal Grandfather 65  . Hyperlipidemia Maternal Grandfather   . Coronary artery disease Paternal Grandmother   . Hypertension Paternal Grandmother   .  Hyperlipidemia Paternal Grandmother   . Diabetes Mellitus I Paternal Grandmother   . Hypertension Paternal Grandfather   . Hyperlipidemia Paternal Grandfather   .  Coronary artery disease Paternal Grandfather     ALLERGIES:  is allergic to caine-1; flagyl; adhesive; iron; oxycodone; penicillins; and sulfa antibiotics.  MEDICATIONS:  Current Outpatient Prescriptions  Medication Sig Dispense Refill  . Coenzyme Q10 (COQ-10) 100 MG CAPS Take 100 mg by mouth daily.    . DULoxetine (CYMBALTA) 60 MG capsule Take 60 mg by mouth at bedtime.     Marland Kitchen esomeprazole (NEXIUM) 40 MG capsule Take 1 capsule (40 mg total) by mouth 2 (two) times daily before a meal. 60 capsule 5  . HYDROcodone-acetaminophen (NORCO/VICODIN) 5-325 MG tablet Take 1-2 tablets by mouth every 6 (six) hours as needed for moderate pain or severe pain. 40 tablet 0  . hyoscyamine (LEVBID) 0.375 MG 12 hr tablet Take 0.375 mg by mouth daily.     Marland Kitchen lidocaine-prilocaine (EMLA) cream Apply to affected area once (Patient taking differently: Apply 1 application topically daily as needed (For port-a-cath.). ) 30 g 3  . meloxicam (MOBIC) 15 MG tablet Take 15 mg by mouth daily as needed for pain.     . methocarbamol (ROBAXIN) 500 MG tablet Take 2 tablets (1,000 mg total) by mouth every 6 (six) hours as needed for muscle spasms. (Patient taking differently: Take 500-1,000 mg by mouth every 8 (eight) hours as needed for muscle spasms. ) 20 tablet 3  . ondansetron (ZOFRAN) 8 MG tablet Take 1 tablet (8 mg total) by mouth 2 (two) times daily. Start the day after chemo for 2 days. Then take as needed for nausea or vomiting. (Patient taking differently: Take 8 mg by mouth 2 (two) times daily as needed for nausea or vomiting. Start the day after chemo for 2 days. Then take as needed for nausea or vomiting.) 10 tablet 3  . promethazine (PHENERGAN) 25 MG tablet Take 25 mg by mouth every 8 (eight) hours as needed for nausea or vomiting.     . psyllium (METAMUCIL)  58.6 % powder Take 1 packet by mouth 2 (two) times daily.    . temazepam (RESTORIL) 15 MG capsule Take 15 mg by mouth at bedtime as needed for sleep (one to three hours before bed).    . traMADol (ULTRAM) 50 MG tablet Take 50 mg by mouth 2 (two) times daily as needed for moderate pain. pain  2  . Vitamin D, Ergocalciferol, (DRISDOL) 50000 UNITS CAPS capsule Take 50,000 Units by mouth 2 (two) times a week.    Marland Kitchen BYSTOLIC 10 MG tablet Take 10 mg by mouth daily. Reported on 01/22/2016  0   No current facility-administered medications for this visit.    REVIEW OF SYSTEMS:   Constitutional: Denies fevers, chills or abnormal night sweats Eyes: Denies blurriness of vision, double vision or watery eyes Ears, nose, mouth, throat, and face: Denies mucositis or sore throat Respiratory: Denies cough, dyspnea or wheezes Cardiovascular: Denies palpitation, chest discomfort or lower extremity swelling Gastrointestinal:(+) abdominal pain  Skin: Denies abnormal skin rashes Lymphatics: Denies new lymphadenopathy or easy bruising Neurological:Denies numbness, tingling or new weaknesses Behavioral/Psych: Mood is stable, no new changes  All other systems were reviewed with the patient and are negative.  PHYSICAL EXAMINATION: ECOG PERFORMANCE STATUS: 2-3  Filed Vitals:   01/22/16 1000  BP: 133/99  Pulse: 95  Temp: 98.4 F (36.9 C)  Resp: 18   Filed Weights   01/22/16 1000  Weight: 269 lb 14.4 oz (122.426 kg)    GENERAL:alert, no distress and comfortable SKIN: skin color, texture, turgor are normal, no rashes or significant lesions  EYES: normal, conjunctiva are pink and non-injected, sclera clear OROPHARYNX:no exudate, no erythema and lips, buccal mucosa, and tongue normal  NECK: supple, thyroid normal size, non-tender, without nodularity LYMPH:  no palpable lymphadenopathy in the cervical, axillary or inguinal LUNGS: clear to auscultation and percussion with normal breathing effort HEART:  regular rate & rhythm and no murmurs and no lower extremity edema ABDOMEN:abdomen soft, normal bowel sounds. No organomegaly,  Multiple surgical incision scar are well healed. (+) Ileostomy back in the right side abdomen  Musculoskeletal:no cyanosis of digits and no clubbing  PSYCH: alert & oriented x 3 with fluent speech NEURO: no focal motor/sensory deficits   LABORATORY DATA:  I have reviewed the data as listed CBC Latest Ref Rng 01/19/2016 01/12/2016 12/21/2015  WBC 3.9 - 10.3 10e3/uL 7.6 9.8 8.7  Hemoglobin 11.6 - 15.9 g/dL 12.1 11.5(L) 9.7(L)  Hematocrit 34.8 - 46.6 % 37.9 35.8 29.9(L)  Platelets 145 - 400 10e3/uL 435(H) 319 368    CMP Latest Ref Rng 01/19/2016 01/12/2016 01/02/2016  Glucose 70 - 140 mg/dl 134 129 -  BUN 7.0 - 26.0 mg/dL 8.0 10.1 -  Creatinine 0.6 - 1.1 mg/dL 0.8 0.7 0.54  Sodium 136 - 145 mEq/L 142 139 -  Potassium 3.5 - 5.1 mEq/L 3.9 4.2 4.1  Chloride 101 - 111 mmol/L - - -  CO2 22 - 29 mEq/L 25 26 -  Calcium 8.4 - 10.4 mg/dL 11.0(H) 10.5(H) -  Total Protein 6.4 - 8.3 g/dL 8.4(H) 7.8 -  Total Bilirubin 0.20 - 1.20 mg/dL <0.30 0.35 -  Alkaline Phos 40 - 150 U/L 103 105 -  AST 5 - 34 U/L 11 10 -  ALT 0 - 55 U/L 12 11 -   CEA  Status: Finalresult Visible to patient:  Not Released Nextappt: Today at 09:45 AM in Oncology Burr Medico, Krista Blue, MD) Dx:  Rectal adenocarcinoma [facility])           Ref Range 3d ago  52moago     CEA 0.0 - 4.7 ng/mL 1.9 1.5CM         PATHOLOGY REPORT Diagnosis 05/25/2015 1. Colon, segmental resection for tumor, recto-sigmoid INVASIVE COLONIC ADENOCARCINOMA (0.2 CM IN GREATEST DIMENSION RESIDUAL TUMOR) THE TUMOR INVADE SUBMUCOSA (0.3 CM IN DEPTH, PT1) MARGINS OF RESECTION ARE NEGATIVE FOR TUMOR TWELVE BENIGN LYMPH NODES (0/12) 2. Colon, resection margin (donut), proximal anastomotic ring BENIGN COLONIC TISSUE, NEGATIVE FOR MALIGANCY 3. Colon, resection margin (donut), distal anastomotic ring BENIGN COLONIC TISSUE,  NEGATIVE FOR MALIGNANCY 4. Ovary, right BENIGN SEROUS CYSTADENOMA.  Microscopic Comment 1. COLON AND RECTUM (INCLUDING TRANS-ANAL RESECTION): Specimen: Colon-rectum Procedure: Segmental resection Tumor site: Rectum anterior wall mid to distal third of rectum Specimen integrity: intact Macroscopic intactness of mesorectum: Complete: _ Macroscopic tumor perforation: Negative Invasive tumor: Maximum size: Histologic type(s): G2 Histologic grade and differentiation: G1: well differentiated/low grade G2: moderately differentiated/low grade G3: poorly differentiated/high grade G4: undifferentiated/high grade Type of polyp in which invasive carcinoma arose: _ Microscopic extension of invasive tumor:Submucosa Lymph-Vascular invasion: Not identified Peri-neural invasion: negative Tumor deposit(s) (discontinuous extramural extension): Negative Resection margins: Proximal margin: Negative Distal margin: Negative Circumferential (radial) (posterior ascending, posterior descending; lateral and posterior mid-rectum; and entire lower 1/3 rectum):Negatvie Mesenteric margin (sigmoid and transverse): Negative Distance closest margin (if all above margins negative): 2.5 cm Treatment effect (neo-adjuvant therapy): Present Additional polyp(s): Negative Non-neoplastic findings: Unremarkable Lymph nodes: number examined 12; number positive: 0 Pathologic Staging: T1, N0, M_ Ancillary studies: Preserved expression of the makor and minor MMR  proteins MLH1, MSH2, MSH6 and PMS2. MLH1:    RADIOGRAPHIC STUDIES: I have personally reviewed the radiological images as listed and agreed with the findings in the report.  Ct Abdomen Pelvis W Contrast 01/20/2015    IMPRESSION: Right lateral rectal wall exophytic mass likely corresponding to the known neoplasm.  Two ill-defined hepatic hypo enhancing lesions which appears to correspond to the lesions seen on the prior MRI. Please note evaluation of the liver  is very limited due to streak artifact. If there is clinical concern for new metastatic disease MRI may provide better evaluation.  Small left ovarian cystic lesion corresponding to the lesion reported on the prior pelvic ultrasound. Ultrasound may provide better evaluation of the pelvic structures.   Electronically Signed   By: Anner Crete M.D.   On: 01/20/2015 16:15   Lower endoscopic ultrasound by Dr. Paulita Fujita 01/18/2015 FINDINGS: Normal digital rectal exam; could not palpate mass. In proximal rectum extending to the distal rectum, from 12 cm to 20 cm from the anal verge, a partial circumferential (50-75% circumferential distally, 75-100% circumferential proximall) mass was seen. Mass was firm, fixed, ulcerated, friable. EUS radial scope was able to traverse the tumor into normal-appearing distal sigmoid colon. Lesion invaded through the wall of the rectum into neighboring connective tissues in multiple regions. There were several round hypoechoic well-defined malignant-appearing lymph nodes in the vicinity of the proximal portion of the tumor. STAGING: T3 N2 Mx via rectal ultrasound ENDOSCOPIC IMPRESSION: As above. Rectosigmoid mass, biopsy-proven adenocarcinoma, with local invasion noted under ultrasound. RECOMMENDATIONS: 1. Watch for potential complications of procedure. 2. Needs CT chest, abdomen, pelvis to complete staging. 3. Based already on rectal ultrasound results, patient would not be candidate for upfront surgery. She will need surgical and radiation oncology consultations. 4. Will discuss with Dr. Amedeo Plenty.  CT chest 02/07/2015 IMPRESSION: 1. No findings of metastatic disease to the chest.  Abdomen MRI w and wo contrast 02/05/2015  IMPRESSION: 1. Small cavernous hemangiomas in segments 5 and 7 of the liver redemonstrated, as above. Additional 7 mm indeterminate lesion is also favored to represent a tiny cavernous hemangioma, but may alternatively represent a small cyst. No new  suspicious appearing hepatic lesions to suggest presence of metastatic disease at this time. 2. Mild hepatic steatosis.   ASSESSMENT & PLAN: Ms. Bastone is a 50 year old female with chronic abdominal pain with rectal mass on colonoscopy found to be invasive rectal adenocarcinoma.   1. T3N2M0, Stage IIIC rectal adenocarcinoma, ypT1N0 after neoadjuvant chemotherapy and radiation -I previously reviewed her  Surgical pathology result in great detail with patient and her husband. She has had a good response to neoadjuvant chemoradiation, has a small residual tumor on the surgical specimen, no  Lymph node metastasis. -I recommended adjuvant chemotherapy, however she has not been able to take it due to the pelvic abscess after surgery. - We'll continue her surveillance. She had a multiple CT of the abdomen and pelvis after surgery, last one in June 2017, which did not  Show evidence of recurrence. Her tumor marker CEA has been normal.  -We'll repeat a restaging CT scan at the end of the year. -Due to her abscess, she would like to postpone her colonoscopy  2. Pelvic abscess -This is being managed by her surgeon Dr. Johney Maine, she is status post ileostomy, draining tube has been removed. -His most resolved now.  3. Microcytic anemia, secondary to GI blood loss and iron deficiency anemia -she responded to IV for him very well. Her hemoglobin  dropped to 6.4 when she was in the hospital a months ago, now has improved to 11.3 after 2 doses of IV Feraheme -We'll continue monitoring her I'll level  4. HTN, depression -She'll continue follow-up with her primary care physician -Her HTN medication Has Been Held Due To Her Dehydration after Hospitalization, her blood pressure fluctuates, we'll monitor for now.  6.  Anorexia and depression -She tries mirtazapine, did not help. - I encouraged her to take nutrition supplement  7. Dehydration  -She still has intermittent high ileostomy output, requires IV  fluids -I'll set up normal saline 1.5 L twice a week for the next 3 weeks, and reassess her in 3 weeks   Plan: -NS 1.5 L every Monday and Thursday for the next 3 weeks -I'll see her back in 3 weeks   I spent 25 mins for the visit, >50% face to face discussion.   Truitt Merle  01/22/2016

## 2016-01-22 NOTE — Progress Notes (Signed)
PATIENT HAS STATED FOR THE SECOND VISIT IN A ROW THAT BLOOD RETURN IS NEVER OBTAINED FROM HER PORT. SHE IS AWARE WE ONLY INSTILL FLUIDS TODAY.

## 2016-01-22 NOTE — Telephone Encounter (Signed)
Gave patient avs report and appointments for July and August. Patient will have IVF's this Thursday at [facility] at capacity. Message to YF re 3 week f/u - YF out of the office week of 8/31.

## 2016-01-25 ENCOUNTER — Ambulatory Visit (HOSPITAL_COMMUNITY)
Admission: RE | Admit: 2016-01-25 | Discharge: 2016-01-25 | Disposition: A | Discharge status: Home / Self Care | Payer: No Typology Code available for payment source | Source: Ambulatory Visit | Attending: Hematology | Admitting: Hematology

## 2016-01-25 DIAGNOSIS — E860 Dehydration: Secondary | ICD-10-CM | POA: Insufficient documentation

## 2016-01-25 DIAGNOSIS — Z932 Ileostomy status: Secondary | ICD-10-CM | POA: Diagnosis not present

## 2016-01-25 MED ORDER — HEPARIN SOD (PORK) LOCK FLUSH 100 UNIT/ML IV SOLN
500.0000 [IU] | INTRAVENOUS | Status: AC | PRN
  Administered 2016-01-25: 500 [IU]
  Filled 2016-01-25: qty 5

## 2016-01-25 MED ORDER — SODIUM CHLORIDE 0.9 % IV SOLN
INTRAVENOUS | Status: AC
  Administered 2016-01-25 (×2): via INTRAVENOUS

## 2016-01-25 MED ORDER — SODIUM CHLORIDE 0.9 % IV SOLN: INTRAVENOUS | Status: DC

## 2016-01-25 MED ORDER — SODIUM CHLORIDE 0.9% FLUSH
10.0000 mL | INTRAVENOUS | Status: AC | PRN
  Administered 2016-01-25: 10 mL

## 2016-01-25 NOTE — Progress Notes (Signed)
Anemia of chronic disease - Primary     ICD-9-CM: 285.29 ICD-10-CM: D63.8       Provider: Dr. Burr Medico  Procedure: Pt received 1.5 liters of normal saline over 2 hours.  Pt tolerated well.  Post procedure: Pt was alert, oriented and ambulatory at discharge. Discharge instructions given to patient.. Pt's porta cath deacessed and flushed per protocol.

## 2016-01-26 ENCOUNTER — Other Ambulatory Visit: Payer: Self-pay | Admitting: Surgery

## 2016-01-26 DIAGNOSIS — K9189 Other postprocedural complications and disorders of digestive system: Secondary | ICD-10-CM

## 2016-01-29 ENCOUNTER — Ambulatory Visit (HOSPITAL_BASED_OUTPATIENT_CLINIC_OR_DEPARTMENT_OTHER): Payer: No Typology Code available for payment source

## 2016-01-29 ENCOUNTER — Ambulatory Visit (HOSPITAL_BASED_OUTPATIENT_CLINIC_OR_DEPARTMENT_OTHER): Payer: No Typology Code available for payment source | Admitting: Nurse Practitioner

## 2016-01-29 VITALS — BP 133/76 | HR 110 | Temp 98.6°F | Resp 20

## 2016-01-29 DIAGNOSIS — C20 Malignant neoplasm of rectum: Secondary | ICD-10-CM | POA: Diagnosis not present

## 2016-01-29 DIAGNOSIS — E860 Dehydration: Secondary | ICD-10-CM

## 2016-01-29 DIAGNOSIS — D638 Anemia in other chronic diseases classified elsewhere: Secondary | ICD-10-CM | POA: Diagnosis not present

## 2016-01-29 LAB — COMPREHENSIVE METABOLIC PANEL
ALBUMIN: 3.4 g/dL — AB (ref 3.5–5.0)
ALK PHOS: 81 U/L (ref 40–150)
ALT: 10 U/L (ref 0–55)
ANION GAP: 9 meq/L (ref 3–11)
AST: 15 U/L (ref 5–34)
BUN: 13.7 mg/dL (ref 7.0–26.0)
CALCIUM: 10.5 mg/dL — AB (ref 8.4–10.4)
CO2: 19 mEq/L — ABNORMAL LOW (ref 22–29)
Chloride: 111 mEq/L — ABNORMAL HIGH (ref 98–109)
Creatinine: 0.8 mg/dL (ref 0.6–1.1)
EGFR: 89 mL/min/{1.73_m2} — AB (ref >90)
Glucose: 128 mg/dl (ref 70–140)
POTASSIUM: 4.6 meq/L (ref 3.5–5.1)
SODIUM: 139 meq/L (ref 136–145)
TOTAL PROTEIN: 7.9 g/dL (ref 6.4–8.3)

## 2016-01-29 LAB — CBC WITH DIFFERENTIAL/PLATELET
BASO%: 1.1 % (ref 0.0–2.0)
BASOS ABS: 0.1 10*3/uL (ref 0.0–0.1)
EOS ABS: 0.2 10*3/uL (ref 0.0–0.5)
EOS%: 1.9 % (ref 0.0–7.0)
HCT: 43.4 % (ref 34.8–46.6)
HEMOGLOBIN: 13.8 g/dL (ref 11.6–15.9)
LYMPH%: 11.3 % — ABNORMAL LOW (ref 14.0–49.7)
MCH: 27.2 pg (ref 25.1–34.0)
MCHC: 31.8 g/dL (ref 31.5–36.0)
MCV: 85.5 fL (ref 79.5–101.0)
MONO#: 0.5 10*3/uL (ref 0.1–0.9)
MONO%: 5.5 % (ref 0.0–14.0)
NEUT%: 80.2 % — ABNORMAL HIGH (ref 38.4–76.8)
NEUTROS ABS: 7.5 10*3/uL — AB (ref 1.5–6.5)
PLATELETS: 431 10*3/uL — AB (ref 145–400)
RBC: 5.08 10*6/uL (ref 3.70–5.45)
RDW: 16.4 % — AB (ref 11.2–14.5)
WBC: 9.4 10*3/uL (ref 3.9–10.3)
lymph#: 1.1 10*3/uL (ref 0.9–3.3)

## 2016-01-29 MED ORDER — HEPARIN SOD (PORK) LOCK FLUSH 100 UNIT/ML IV SOLN
500.0000 [IU] | Freq: Once | INTRAVENOUS | Status: AC | PRN
  Administered 2016-01-29: 500 [IU]
  Filled 2016-01-29: qty 5

## 2016-01-29 MED ORDER — SODIUM CHLORIDE 0.9 % IV SOLN
Freq: Once | INTRAVENOUS | Status: AC
  Administered 2016-01-29: 14:00:00 via INTRAVENOUS

## 2016-01-29 MED ORDER — SODIUM CHLORIDE 0.9 % IJ SOLN
10.0000 mL | INTRAMUSCULAR | Status: DC | PRN
  Administered 2016-01-29: 10 mL
  Filled 2016-01-29: qty 10

## 2016-01-29 MED ORDER — ALTEPLASE 2 MG IJ SOLR
2.0000 mg | Freq: Once | INTRAMUSCULAR | Status: DC | PRN
  Filled 2016-01-29: qty 2

## 2016-01-29 NOTE — Progress Notes (Signed)
Pt c/o light headedness and dizzyness with standing -upon arrival to infusion. States she has very large output from ostomy bag over the weekend 7-8 liters. Is here today for hydration fluids. Last labs done on 01/19/16. Has orthostatic changes in VS. Dr. Burr Medico and Selena Lesser, NP made aware.   Hemoccult done of ostomy output d/t red coloration per order of Selena Lesser, NP.  Heme negative. Selena Lesser made aware.  Labs done per lab tech.  Per Selena Lesser, NP- she will review labs and call pt at home to let her know if she is return for more IVF's tomorrow.  Pt requested port to be left accessed as she with at the least-get fluids again on Thursday, if not more times this week. Pt states she feels better after 1.5 liters of fluid. VS improved.

## 2016-01-29 NOTE — Patient Instructions (Signed)

## 2016-01-30 ENCOUNTER — Telehealth: Payer: Self-pay

## 2016-01-30 ENCOUNTER — Ambulatory Visit (HOSPITAL_BASED_OUTPATIENT_CLINIC_OR_DEPARTMENT_OTHER): Payer: No Typology Code available for payment source

## 2016-01-30 VITALS — BP 122/86 | HR 70 | Temp 97.7°F | Resp 18

## 2016-01-30 DIAGNOSIS — D638 Anemia in other chronic diseases classified elsewhere: Secondary | ICD-10-CM

## 2016-01-30 MED ORDER — SODIUM CHLORIDE 0.9 % IJ SOLN
10.0000 mL | INTRAMUSCULAR | Status: DC | PRN
  Administered 2016-01-30: 10 mL
  Filled 2016-01-30: qty 10

## 2016-01-30 MED ORDER — HEPARIN SOD (PORK) LOCK FLUSH 100 UNIT/ML IV SOLN
500.0000 [IU] | Freq: Once | INTRAVENOUS | Status: AC | PRN
Start: 2016-01-30 — End: 2016-01-30
  Administered 2016-01-30: 500 [IU]
  Filled 2016-01-30: qty 5

## 2016-01-30 MED ORDER — SODIUM CHLORIDE 0.9 % IV SOLN
Freq: Once | INTRAVENOUS | Status: AC
  Administered 2016-01-30: 15:00:00 via INTRAVENOUS

## 2016-01-30 NOTE — Telephone Encounter (Signed)
Called to inform patient of todays appt in infusion at 3pm for IVF

## 2016-01-30 NOTE — Patient Instructions (Signed)

## 2016-01-31 ENCOUNTER — Telehealth: Payer: Self-pay | Admitting: Hematology

## 2016-01-31 NOTE — Telephone Encounter (Signed)
Per staff message response from YF re 3 week f/u per 7/10 pof she will see patient 8/7. Spoke with patient re new appointment and patient will get new schedule tomorrow - 8/11 appointments cxd.

## 2016-02-01 ENCOUNTER — Telehealth: Payer: Self-pay | Admitting: Hematology

## 2016-02-01 ENCOUNTER — Other Ambulatory Visit: Payer: Self-pay | Admitting: Nurse Practitioner

## 2016-02-01 ENCOUNTER — Other Ambulatory Visit: Payer: No Typology Code available for payment source

## 2016-02-01 ENCOUNTER — Encounter: Payer: Self-pay | Admitting: Nurse Practitioner

## 2016-02-01 ENCOUNTER — Ambulatory Visit (HOSPITAL_BASED_OUTPATIENT_CLINIC_OR_DEPARTMENT_OTHER): Payer: No Typology Code available for payment source

## 2016-02-01 VITALS — BP 141/64 | HR 63 | Temp 98.3°F | Resp 17

## 2016-02-01 DIAGNOSIS — D638 Anemia in other chronic diseases classified elsewhere: Secondary | ICD-10-CM

## 2016-02-01 DIAGNOSIS — E860 Dehydration: Secondary | ICD-10-CM

## 2016-02-01 DIAGNOSIS — D509 Iron deficiency anemia, unspecified: Secondary | ICD-10-CM

## 2016-02-01 MED ORDER — HEPARIN SOD (PORK) LOCK FLUSH 100 UNIT/ML IV SOLN
500.0000 [IU] | Freq: Once | INTRAVENOUS | Status: AC | PRN
  Administered 2016-02-01: 500 [IU]
  Filled 2016-02-01: qty 5

## 2016-02-01 MED ORDER — SODIUM CHLORIDE 0.9 % IJ SOLN
10.0000 mL | INTRAMUSCULAR | Status: DC | PRN
  Administered 2016-02-01: 10 mL
  Filled 2016-02-01: qty 10

## 2016-02-01 MED ORDER — SODIUM CHLORIDE 0.9 % IV SOLN
Freq: Once | INTRAVENOUS | Status: AC
  Administered 2016-02-01: 13:00:00 via INTRAVENOUS

## 2016-02-01 NOTE — Patient Instructions (Signed)

## 2016-02-01 NOTE — Telephone Encounter (Signed)
spoke w/ pt confirmed 7/22 apt time

## 2016-02-01 NOTE — Assessment & Plan Note (Signed)
Patient states that she has noted increasingly high output from her ileostomy for the past week or so.  She feels further dehydrated today.  She also has been experiencing some orthostatic hypotension.  Sitting blood pressure at initial check at the cancer Center was 120/90 and standing blood pressure was 102/74.  She states that she feels dizzy occasionally when she makes sudden position changes.  Exam today revealed.  Patient appearing fairly well.  She was in no acute distress.  Stool in her ostomy bag appeared to have a red color to it; it was negative for Hemoccult blood.  Patient will continue to receive IV fluid rehydration every Monday and Thursday for the next 3 weeks per Dr. Burr Medico.   Patient was advised to call/return of directly to the emergency department for any worsening symptoms whatsoever.

## 2016-02-01 NOTE — Progress Notes (Signed)
 [facility]    Chief Complaint: High output ostomy and dehydration.  HPI:  Lindsay Little 50 y.o. female diagnosed with rectal cancer.  Patient is currently recovering from surgery and receiving supportive care only.    Patient states that she has noted increasingly high output from her ileostomy for the past week or so.  She feels further dehydrated today.  She also has been experiencing some orthostatic hypotension.  Sitting blood pressure at initial check at the cancer Center was 120/90 and standing blood pressure was 102/74.  She states that she feels dizzy occasionally when she makes sudden position changes.  Exam today revealed.  Patient appearing fairly well.  She was in no acute distress.  Stool in her ostomy bag appeared to have a red color to it; it was negative for Hemoccult blood.  Patient will continue to receive IV fluid rehydration every Monday and Thursday for the next 3 weeks per Dr. [name].   Patient was advised to call/return of directly to the emergency department for any worsening symptoms whatsoever.  Oncology History   Rectal adenocarcinoma   Staging form: Colon and Rectum, AJCC 7th Edition     Clinical: T3, N2, M0 - Unsigned       Rectal adenocarcinoma s/p LAR resection 05/25/2015   01/13/2015 Initial Diagnosis Rectal adenocarcinoma   01/13/2015 Procedure Colonoscopy showed a sensitivity O nonobstructing mass in the rectum and from 12-18 cm proximal to the Annis. The mass was circumferential, measuring about 6 cm in length. EGD was negative.   01/17/2015 Tumor Marker CEA 1.4, CA-19-9 8, MMR normal    01/18/2015 Procedure Lower endoscopic ultrasound by Dr. outlaw showed a T3 N2 rectal mass.   01/20/2015 Imaging CT abdomen and pelvis with contrast showed right lateral rectal wall exophytic mass, and 2 ill-defined hepatic hypoenhancing lesions which appears to correspond to the lesion seen on the prior MRI in 2015.   02/05/2015 Imaging abdomen MRI showed 2  hemangioma, no suspicion for metastatic disease. CT chest was negative   02/20/2015 - 03/30/2015 Radiation Therapy neoadjuvant RT to rectal cancer    02/20/2015 Concurrent Chemotherapy capecitabine 2500 mg in the morning and 2000 mg in the evening (825mg/m2, bid), on the day of radiation.    05/25/2015 Surgery Recto-sigmoid segmental resection, margins are negative    05/25/2015 Pathology Results 0.2cm residual invasive adenocarcinoma, G2, negative margins, 12 nodes were negative    06/23/2015 - 06/29/2015 Hospital Admission Patient was admitted for pelvic abscess, drain placed and she was treated with IV antibiotics, she also received 1 dose of Feraheme for anemia.   09/15/2015 - 09/17/2015 Hospital Admission Recurrent pelvic collection s/p perc drainage 09/16/2015    Review of Systems  Constitutional: Positive for malaise/fatigue.  Neurological: Positive for dizziness.  All other systems reviewed and are negative.   Past Medical History  Diagnosis Date  . Hypertension   . Hypercholesteremia   . Obesity   . Tear of left rotator cuff   . IBS (irritable bowel syndrome)   . Vertigo   . TMJ (dislocation of temporomandibular joint)   . Bulging disc     L5  . Depression   . Rectal bleeding   . Vitamin D deficiency   . Osteoarthritis, knee   . S/P radiation therapy 02/20/15-03/30/15    colon/rectal  . Complication of anesthesia   . PONV (postoperative nausea and vomiting)   . History of mitral valve prolapse   . Mild sleep apnea       does not use CPAP  . History of chemotherapy   . Numbness and tingling     great toe bilat left more than right   . History of frequent ear infections   . History of frequent urinary tract infections     middle school age  . GERD (gastroesophageal reflux disease)   . Cancer (HCC)   . Anemia   . History of blood transfusion   . Fibroids   . 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 Heart murmur   . Occasional tremors   . Wears glasses   .  Anxiety   . History of hiatal hernia     Past Surgical History  Procedure Laterality Date  . Rotator cuff repair    . Eus N/A 01/18/2015    Procedure: LOWER ENDOSCOPIC ULTRASOUND (EUS);  Surgeon: William Outlaw, MD;  Location: [facility];  Service: Endoscopy;  Laterality: N/A;  . Abdominal hysterectomy  1996    Removed uterus, and tubes  . Left knee arthroscopy      mult  . Tonsillectomy    . Dilation and curettage of uterus      times 2  . Xi robotic assisted lower anterior resection N/A 05/25/2015    Procedure: XI ROBOTIC ASSISTED LOWER ANTERIOR RESECTION, , RIGID PROCTOSCOPY, RIGHT OOPHORECTOMY;  Surgeon: Steven Gross, MD;  Location: [facility];  Service: General;  Laterality: N/A;  . Portacath placement N/A 05/25/2015    Procedure: INSERTION PORT-A-CATH;  Surgeon: Steven Gross, MD;  Location: [facility];  Service: General;  Laterality: N/A;  . Jp drain     . Ileo loop colostomy closure N/A 11/17/2015    Procedure: LAPAROSCOPIC DIVERTING LOOP ILEOSTOMY  DRAINAGE OF PELVIC ABSCESS;  Surgeon: Steven Gross, MD;  Location: [facility];  Service: General;  Laterality: N/A;  . Impaction removal  11/17/2015    Procedure: DISIMPACTION REMOVAL;  Surgeon: Steven Gross, MD;  Location: [facility];  Service: General;;  . Excision of skin tag  11/17/2015    Procedure: EXCISION OF SKIN TAG;  Surgeon: Steven Gross, MD;  Location: [facility];  Service: General;;  . Laparoscopic lysis of adhesions  11/17/2015    Procedure: LAPAROSCOPIC LYSIS OF ADHESIONS;  Surgeon: Steven Gross, MD;  Location: [facility];  Service: General;;    has Pure hypercholesterolemia; Morbid obesity with BMI of 40.0-44.9, adult (HCC); Family history of ischemic heart disease; Rectal adenocarcinoma s/p LAR resection 05/25/2015; Cavernous hemangioma of liver - segments 5 & 7; Osteoarthritis of left knee; Crohn's disease (HCC); Hypertension; IBS (irritable bowel syndrome); Depression; Anemia of chronic disease; Pelvic abscess s/p drainage & omental pedicle flap  11/17/2015; Protein-calorie malnutrition, moderate (HCC); Large bowel delayed anastomotic leak s/p diverting loop ileostomy 11/17/2015; Ileostomy in place (HCC); Dehydration; Abdominal pain; Gastroenteritis; Hydroureter on right; and Occlusion of Portacatheter central line on her problem list.    is allergic to caine-1; flagyl; adhesive; iron; oxycodone; penicillins; and sulfa antibiotics.    Medication List       This list is accurate as of: 01/29/16 11:59 PM.  Always use your most recent med list.               BYSTOLIC 10 MG tablet  Generic drug:  nebivolol  Take 10 mg by mouth daily. Reported on 01/22/2016     CoQ-10 100 MG Caps  Take 100 mg by mouth daily.     DULoxetine 60 MG capsule  Commonly known as:  CYMBALTA  Take 60 mg by mouth   at bedtime.     esomeprazole 40 MG capsule  Commonly known as:  NEXIUM  Take 1 capsule (40 mg total) by mouth 2 (two) times daily before a meal.     HYDROcodone-acetaminophen 5-325 MG tablet  Commonly known as:  NORCO/VICODIN  Take 1-2 tablets by mouth every 6 (six) hours as needed for moderate pain or severe pain.     hyoscyamine 0.375 MG 12 hr tablet  Commonly known as:  LEVBID  Take 0.375 mg by mouth daily.     lidocaine-prilocaine cream  Commonly known as:  EMLA  Apply to affected area once     meloxicam 15 MG tablet  Commonly known as:  MOBIC  Take 15 mg by mouth daily as needed for pain.     methocarbamol 500 MG tablet  Commonly known as:  ROBAXIN  Take 2 tablets (1,000 mg total) by mouth every 6 (six) hours as needed for muscle spasms.     ondansetron 8 MG tablet  Commonly known as:  ZOFRAN  Take 1 tablet (8 mg total) by mouth 2 (two) times daily. Start the day after chemo for 2 days. Then take as needed for nausea or vomiting.     promethazine 25 MG tablet  Commonly known as:  PHENERGAN  Take 25 mg by mouth every 8 (eight) hours as needed for nausea or vomiting.     psyllium 58.6 % powder  Commonly known as:  METAMUCIL    Take 1 packet by mouth 2 (two) times daily.     temazepam 15 MG capsule  Commonly known as:  RESTORIL  Take 15 mg by mouth at bedtime as needed for sleep (one to three hours before bed).     traMADol 50 MG tablet  Commonly known as:  ULTRAM  Take 50 mg by mouth 2 (two) times daily as needed for moderate pain. pain     Vitamin D (Ergocalciferol) 50000 units Caps capsule  Commonly known as:  DRISDOL  Take 50,000 Units by mouth 2 (two) times a week.         PHYSICAL EXAMINATION  Oncology Vitals 01/30/2016 01/30/2016  Height - -  Weight - -  Weight (lbs) - -  BMI (kg/m2) - -  Temp - 97.7  Pulse 70 96  Resp - 18  SpO2 - 100  BSA (m2) - -   BP Readings from Last 2 Encounters:  01/30/16 122/86  01/29/16 133/76    Physical Exam  Constitutional: She is oriented to person, place, and time and well-developed, well-nourished, and in no distress.  HENT:  Head: Normocephalic and atraumatic.  Mouth/Throat: Oropharynx is clear and moist.  Eyes: Conjunctivae and EOM are normal. Pupils are equal, round, and reactive to light. Right eye exhibits no discharge. Left eye exhibits no discharge. No scleral icterus.  Neck: Normal range of motion. Neck supple. No JVD present. No tracheal deviation present. No thyromegaly present.  Cardiovascular: Normal rate, regular rhythm, normal heart sounds and intact distal pulses.   Pulmonary/Chest: Effort normal and breath sounds normal. No respiratory distress. She has no wheezes. She has no rales. She exhibits no tenderness.  Abdominal: Soft. Bowel sounds are normal. She exhibits no distension and no mass. There is no tenderness. There is no rebound and no guarding.  Musculoskeletal: Normal range of motion. She exhibits no edema or tenderness.  Lymphadenopathy:    She has no cervical adenopathy.  Neurological: She is alert and oriented to person, place, and time. Gait normal.    Skin: Skin is warm and dry. No rash noted. No erythema. No pallor.   Psychiatric: Affect normal.    LABORATORY DATA:. Appointment on 01/29/2016  Component Date Value Ref Range Status  . WBC 01/29/2016 9.4  3.9 - 10.3 10e3/uL Final  . NEUT# 01/29/2016 7.5* 1.5 - 6.5 10e3/uL Final  . HGB 01/29/2016 13.8  11.6 - 15.9 g/dL Final  . HCT 01/29/2016 43.4  34.8 - 46.6 % Final  . Platelets 01/29/2016 431* 145 - 400 10e3/uL Final  . MCV 01/29/2016 85.5  79.5 - 101.0 fL Final  . MCH 01/29/2016 27.2  25.1 - 34.0 pg Final  . MCHC 01/29/2016 31.8  31.5 - 36.0 g/dL Final  . RBC 01/29/2016 5.08  3.70 - 5.45 10e6/uL Final  . RDW 01/29/2016 16.4* 11.2 - 14.5 % Final  . lymph# 01/29/2016 1.1  0.9 - 3.3 10e3/uL Final  . MONO# 01/29/2016 0.5  0.1 - 0.9 10e3/uL Final  . Eosinophils Absolute 01/29/2016 0.2  0.0 - 0.5 10e3/uL Final  . Basophils Absolute 01/29/2016 0.1  0.0 - 0.1 10e3/uL Final  . NEUT% 01/29/2016 80.2* 38.4 - 76.8 % Final  . LYMPH% 01/29/2016 11.3* 14.0 - 49.7 % Final  . MONO% 01/29/2016 5.5  0.0 - 14.0 % Final  . EOS% 01/29/2016 1.9  0.0 - 7.0 % Final  . BASO% 01/29/2016 1.1  0.0 - 2.0 % Final  . Sodium 01/29/2016 139  136 - 145 mEq/L Final  . Potassium 01/29/2016 4.6  3.5 - 5.1 mEq/L Final  . Chloride 01/29/2016 111* 98 - 109 mEq/L Final  . CO2 01/29/2016 19* 22 - 29 mEq/L Final  . Glucose 01/29/2016 128  70 - 140 mg/dl Final   Glucose reference range is for nonfasting patients. Fasting glucose reference range is 70- 100.  Marland Kitchen BUN 01/29/2016 13.7  7.0 - 26.0 mg/dL Final  . Creatinine 01/29/2016 0.8  0.6 - 1.1 mg/dL Final  . Total Bilirubin 01/29/2016 <0.30  0.20 - 1.20 mg/dL Final  . Alkaline Phosphatase 01/29/2016 81  40 - 150 U/L Final  . AST 01/29/2016 15  5 - 34 U/L Final  . ALT 01/29/2016 10  0 - 55 U/L Final  . Total Protein 01/29/2016 7.9  6.4 - 8.3 g/dL Final  . Albumin 01/29/2016 3.4* 3.5 - 5.0 g/dL Final  . Calcium 01/29/2016 10.5* 8.4 - 10.4 mg/dL Final  . Anion Gap 01/29/2016 9  3 - 11 mEq/L Final  . EGFR 01/29/2016 89* >90 ml/min/1.73  m2 Final   eGFR is calculated using the CKD-EPI Creatinine Equation (2009)    RADIOGRAPHIC STUDIES: No results found.  ASSESSMENT/PLAN:    Rectal adenocarcinoma s/p LAR resection 05/25/2015 Patient continues to recover from her surgery.  She is currently undergoing observation only.  Her most recent restaging scan revealed no reoccurrence.  Patient is scheduled to return every Monday and Thursday for IV fluid rehydration for the next 3 weeks.  She is scheduled for colonoscopy on 02/06/2016.  She is scheduled for labs, flush, and a visit on 02/19/2016.  Dehydration Patient states that she has noted increasingly high output from her ileostomy for the past week or so.  She feels further dehydrated today.  She also has been experiencing some orthostatic hypotension.  Sitting blood pressure at initial check at the cancer Center was 120/90 and standing blood pressure was 102/74.  She states that she feels dizzy occasionally when she makes sudden position changes.  Exam today revealed.  Patient appearing fairly well.  She was in no acute distress.  Stool in her ostomy bag appeared to have a red color to it; it was negative for Hemoccult blood.  Patient will continue to receive IV fluid rehydration every Monday and Thursday for the next 3 weeks per Dr. Burr Medico.   Patient was advised to call/return of directly to the emergency department for any worsening symptoms whatsoever.   Patient stated understanding of all instructions; and was in agreement with this plan of care. The patient knows to call the clinic with any problems, questions or concerns.   Total time spent with patient was 25 minutes;  with greater than 75 percent of that time spent in face to face counseling regarding patient's symptoms,  and coordination of care and follow up.  Disclaimer:This dictation was prepared with Dragon/digital dictation along with [employer]. Any transcriptional errors that result from this process  are unintentional.  Drue Second, NP 02/01/2016

## 2016-02-01 NOTE — Assessment & Plan Note (Signed)
Patient continues to recover from her surgery.  She is currently undergoing observation only.  Her most recent restaging scan revealed no reoccurrence.  Patient is scheduled to return every Monday and Thursday for IV fluid rehydration for the next 3 weeks.  She is scheduled for colonoscopy on 02/06/2016.  She is scheduled for labs, flush, and a visit on 02/19/2016.

## 2016-02-02 ENCOUNTER — Other Ambulatory Visit: Payer: No Typology Code available for payment source

## 2016-02-03 ENCOUNTER — Ambulatory Visit (HOSPITAL_BASED_OUTPATIENT_CLINIC_OR_DEPARTMENT_OTHER): Payer: No Typology Code available for payment source

## 2016-02-03 VITALS — BP 114/68 | HR 74 | Temp 97.9°F | Resp 18

## 2016-02-03 DIAGNOSIS — D638 Anemia in other chronic diseases classified elsewhere: Secondary | ICD-10-CM

## 2016-02-03 DIAGNOSIS — E860 Dehydration: Secondary | ICD-10-CM

## 2016-02-03 DIAGNOSIS — C20 Malignant neoplasm of rectum: Secondary | ICD-10-CM

## 2016-02-03 MED ORDER — HEPARIN SOD (PORK) LOCK FLUSH 100 UNIT/ML IV SOLN
500.0000 [IU] | Freq: Once | INTRAVENOUS | Status: AC | PRN
  Administered 2016-02-03: 500 [IU]
  Filled 2016-02-03: qty 5

## 2016-02-03 MED ORDER — SODIUM CHLORIDE 0.9 % IV SOLN
Freq: Once | INTRAVENOUS | Status: AC
  Administered 2016-02-03: 09:00:00 via INTRAVENOUS

## 2016-02-03 MED ORDER — SODIUM CHLORIDE 0.9 % IJ SOLN
10.0000 mL | INTRAMUSCULAR | Status: DC | PRN
  Administered 2016-02-03: 10 mL
  Filled 2016-02-03: qty 10

## 2016-02-03 NOTE — Patient Instructions (Signed)

## 2016-02-05 ENCOUNTER — Ambulatory Visit (HOSPITAL_BASED_OUTPATIENT_CLINIC_OR_DEPARTMENT_OTHER): Payer: No Typology Code available for payment source

## 2016-02-05 VITALS — BP 150/95 | HR 60 | Temp 98.5°F | Resp 18

## 2016-02-05 DIAGNOSIS — D638 Anemia in other chronic diseases classified elsewhere: Secondary | ICD-10-CM

## 2016-02-05 DIAGNOSIS — C20 Malignant neoplasm of rectum: Secondary | ICD-10-CM

## 2016-02-05 DIAGNOSIS — D509 Iron deficiency anemia, unspecified: Secondary | ICD-10-CM

## 2016-02-05 DIAGNOSIS — E860 Dehydration: Secondary | ICD-10-CM | POA: Diagnosis not present

## 2016-02-05 MED ORDER — SODIUM CHLORIDE 0.9 % IJ SOLN
10.0000 mL | INTRAMUSCULAR | Status: DC | PRN
  Administered 2016-02-05: 10 mL
  Filled 2016-02-05: qty 10

## 2016-02-05 MED ORDER — SODIUM CHLORIDE 0.9 % IV SOLN
Freq: Once | INTRAVENOUS | Status: AC
  Administered 2016-02-05: 13:00:00 via INTRAVENOUS

## 2016-02-05 MED ORDER — HEPARIN SOD (PORK) LOCK FLUSH 100 UNIT/ML IV SOLN
250.0000 [IU] | Freq: Once | INTRAVENOUS | Status: DC | PRN
  Filled 2016-02-05: qty 5

## 2016-02-05 MED ORDER — ALTEPLASE 2 MG IJ SOLR
2.0000 mg | Freq: Once | INTRAMUSCULAR | Status: DC | PRN
  Filled 2016-02-05: qty 2

## 2016-02-05 MED ORDER — HEPARIN SOD (PORK) LOCK FLUSH 100 UNIT/ML IV SOLN
500.0000 [IU] | Freq: Once | INTRAVENOUS | Status: AC | PRN
  Administered 2016-02-05: 500 [IU]
  Filled 2016-02-05: qty 5

## 2016-02-05 MED ORDER — SODIUM CHLORIDE 0.9 % IJ SOLN
3.0000 mL | Freq: Once | INTRAMUSCULAR | Status: DC | PRN
  Filled 2016-02-05: qty 10

## 2016-02-05 MED ORDER — SODIUM CHLORIDE 0.9 % IV SOLN: Freq: Once | INTRAVENOUS | Status: DC

## 2016-02-05 MED ORDER — PROMETHAZINE HCL 25 MG/ML IJ SOLN: 25.0000 mg | Freq: Once | INTRAMUSCULAR | Status: DC

## 2016-02-05 MED ORDER — SODIUM CHLORIDE 0.9 % IV SOLN: 510.0000 mg | Freq: Once | INTRAVENOUS | Status: DC

## 2016-02-06 ENCOUNTER — Inpatient Hospital Stay: Admission: RE | Admit: 2016-02-06 | Payer: No Typology Code available for payment source | Source: Ambulatory Visit

## 2016-02-08 ENCOUNTER — Ambulatory Visit (HOSPITAL_BASED_OUTPATIENT_CLINIC_OR_DEPARTMENT_OTHER): Payer: No Typology Code available for payment source

## 2016-02-08 VITALS — BP 135/86 | HR 81 | Temp 98.9°F | Resp 20

## 2016-02-08 DIAGNOSIS — E860 Dehydration: Secondary | ICD-10-CM

## 2016-02-08 DIAGNOSIS — D638 Anemia in other chronic diseases classified elsewhere: Secondary | ICD-10-CM

## 2016-02-08 DIAGNOSIS — C20 Malignant neoplasm of rectum: Secondary | ICD-10-CM

## 2016-02-08 MED ORDER — HEPARIN SOD (PORK) LOCK FLUSH 100 UNIT/ML IV SOLN
500.0000 [IU] | Freq: Once | INTRAVENOUS | Status: AC | PRN
Start: 2016-02-08 — End: 2016-02-08
  Administered 2016-02-08: 500 [IU]
  Filled 2016-02-08: qty 5

## 2016-02-08 MED ORDER — SODIUM CHLORIDE 0.9 % IV SOLN
Freq: Once | INTRAVENOUS | Status: AC
  Administered 2016-02-08: 13:00:00 via INTRAVENOUS

## 2016-02-08 MED ORDER — SODIUM CHLORIDE 0.9 % IJ SOLN
10.0000 mL | INTRAMUSCULAR | Status: DC | PRN
  Administered 2016-02-08: 10 mL
  Filled 2016-02-08: qty 10

## 2016-02-08 NOTE — Patient Instructions (Signed)
Dehydration, Adult Dehydration means your body does not have as much fluid or water as it needs. It happens when you take in less fluid than you lose. Your kidneys, brain, and heart will not work properly without the right amount of fluids.  Dehydration can range from mild to severe. It should be treated right away to help prevent it from becoming severe. HOME CARE  Drink enough fluid to keep your pee (urine) clear or pale yellow.  Drink water or fluid slowly by taking small sips. You can also try sucking on ice cubes.  Have food or drinks that contain electrolytes. Examples include bananas and sports drinks.  Take over-the-counter and prescription medicines only as told by your doctor.  Prepare oral rehydration solution (ORS) according to the instructions that came with it. Take sips of ORS every 5 minutes until your pee returns to normal.  If you are throwing up (vomiting) or have watery poop (diarrhea), keep trying to drink water, ORS, or both.  If you have watery poop, avoid:  Drinks with caffeine.  Fruit juice.  Milk.  Carbonated soft drinks.  Do not take salt tablets. This can lead to having too much sodium in your body (hypernatremia). GET HELP IF:  You cannot eat or drink without throwing up.  You have had mild watery poop for longer than 24 hours.  You have a fever. GET HELP RIGHT AWAY IF:   You have very strong thirst.  You have very bad watery poop.  You have not peed in 6-8 hours, or you have peed only a small amount of very dark pee.  You have shriveled skin.  You are dizzy, confused, or both.   This information is not intended to replace advice given to you by your health care provider. Make sure you discuss any questions you have with your health care provider.   Document Released: 04/27/2009 Document Revised: 03/22/2015 Document Reviewed: 11/16/2014 Elsevier Interactive Patient Education [employer].

## 2016-02-08 NOTE — Progress Notes (Signed)
Patients ambulates well at discharge. No signs or symptoms of dizziness or distress noted.  Post vital signs stable.

## 2016-02-12 ENCOUNTER — Telehealth: Payer: Self-pay | Admitting: Hematology

## 2016-02-12 ENCOUNTER — Ambulatory Visit: Payer: No Typology Code available for payment source

## 2016-02-12 ENCOUNTER — Other Ambulatory Visit: Payer: Self-pay | Admitting: Surgery

## 2016-02-12 DIAGNOSIS — K9189 Other postprocedural complications and disorders of digestive system: Secondary | ICD-10-CM

## 2016-02-12 NOTE — Telephone Encounter (Signed)
pt husband called 7/31 to cxl  ivf appt and not r/s

## 2016-02-15 ENCOUNTER — Other Ambulatory Visit: Payer: Self-pay

## 2016-02-15 ENCOUNTER — Ambulatory Visit (HOSPITAL_BASED_OUTPATIENT_CLINIC_OR_DEPARTMENT_OTHER): Payer: No Typology Code available for payment source

## 2016-02-15 ENCOUNTER — Ambulatory Visit (HOSPITAL_BASED_OUTPATIENT_CLINIC_OR_DEPARTMENT_OTHER): Payer: No Typology Code available for payment source | Admitting: Nurse Practitioner

## 2016-02-15 ENCOUNTER — Other Ambulatory Visit: Payer: Self-pay | Admitting: *Deleted

## 2016-02-15 VITALS — BP 122/80 | HR 97 | Temp 99.0°F | Resp 18

## 2016-02-15 DIAGNOSIS — E876 Hypokalemia: Secondary | ICD-10-CM

## 2016-02-15 DIAGNOSIS — D509 Iron deficiency anemia, unspecified: Secondary | ICD-10-CM

## 2016-02-15 DIAGNOSIS — E860 Dehydration: Secondary | ICD-10-CM

## 2016-02-15 DIAGNOSIS — C20 Malignant neoplasm of rectum: Secondary | ICD-10-CM

## 2016-02-15 DIAGNOSIS — R197 Diarrhea, unspecified: Secondary | ICD-10-CM

## 2016-02-15 DIAGNOSIS — D638 Anemia in other chronic diseases classified elsewhere: Secondary | ICD-10-CM

## 2016-02-15 LAB — COMPREHENSIVE METABOLIC PANEL
ALBUMIN: 2.6 g/dL — AB (ref 3.5–5.0)
ALK PHOS: 96 U/L (ref 40–150)
ALT: <9 U/L (ref 0–55)
AST: 9 U/L (ref 5–34)
Anion Gap: 12 mEq/L — ABNORMAL HIGH (ref 3–11)
BUN: 7 mg/dL (ref 7.0–26.0)
CALCIUM: 9.7 mg/dL (ref 8.4–10.4)
CHLORIDE: 104 meq/L (ref 98–109)
CO2: 23 mEq/L (ref 22–29)
Creatinine: 0.7 mg/dL (ref 0.6–1.1)
Glucose: 126 mg/dl (ref 70–140)
POTASSIUM: 3 meq/L — AB (ref 3.5–5.1)
SODIUM: 139 meq/L (ref 136–145)
Total Bilirubin: 0.43 mg/dL (ref 0.20–1.20)
Total Protein: 7 g/dL (ref 6.4–8.3)

## 2016-02-15 LAB — CBC WITH DIFFERENTIAL/PLATELET
BASO%: 0.2 % (ref 0.0–2.0)
BASOS ABS: 0 10*3/uL (ref 0.0–0.1)
EOS%: 2.8 % (ref 0.0–7.0)
Eosinophils Absolute: 0.2 10*3/uL (ref 0.0–0.5)
HCT: 33.1 % — ABNORMAL LOW (ref 34.8–46.6)
HEMOGLOBIN: 10.8 g/dL — AB (ref 11.6–15.9)
LYMPH%: 8.5 % — ABNORMAL LOW (ref 14.0–49.7)
MCH: 27.7 pg (ref 25.1–34.0)
MCHC: 32.6 g/dL (ref 31.5–36.0)
MCV: 84.9 fL (ref 79.5–101.0)
MONO#: 0.6 10*3/uL (ref 0.1–0.9)
MONO%: 7 % (ref 0.0–14.0)
NEUT#: 7 10*3/uL — ABNORMAL HIGH (ref 1.5–6.5)
NEUT%: 81.5 % — AB (ref 38.4–76.8)
Platelets: 296 10*3/uL (ref 145–400)
RBC: 3.9 10*6/uL (ref 3.70–5.45)
RDW: 15.8 % — AB (ref 11.2–14.5)
WBC: 8.6 10*3/uL (ref 3.9–10.3)
lymph#: 0.7 10*3/uL — ABNORMAL LOW (ref 0.9–3.3)

## 2016-02-15 LAB — MAGNESIUM: Magnesium: 1.9 mg/dL (ref 1.5–2.5)

## 2016-02-15 MED ORDER — SODIUM CHLORIDE 0.9 % IV SOLN
Freq: Once | INTRAVENOUS | Status: AC
  Administered 2016-02-15: 14:00:00 via INTRAVENOUS

## 2016-02-15 MED ORDER — POTASSIUM CHLORIDE CRYS ER 20 MEQ PO TBCR: 20.0000 meq | EXTENDED_RELEASE_TABLET | Freq: Two times a day (BID) | ORAL | 0 refills | Status: DC

## 2016-02-15 MED ORDER — HEPARIN SOD (PORK) LOCK FLUSH 100 UNIT/ML IV SOLN
250.0000 [IU] | Freq: Once | INTRAVENOUS | Status: DC | PRN
Start: 2016-02-15 — End: 2016-02-15
  Filled 2016-02-15: qty 5

## 2016-02-15 MED ORDER — SODIUM CHLORIDE 0.9 % IJ SOLN
10.0000 mL | INTRAMUSCULAR | Status: DC | PRN
  Administered 2016-02-15: 10 mL
  Filled 2016-02-15: qty 10

## 2016-02-15 MED ORDER — SODIUM CHLORIDE 0.9 % IJ SOLN
3.0000 mL | Freq: Once | INTRAMUSCULAR | Status: DC | PRN
  Filled 2016-02-15: qty 10

## 2016-02-15 MED ORDER — POTASSIUM CHLORIDE CRYS ER 20 MEQ PO TBCR
40.0000 meq | EXTENDED_RELEASE_TABLET | Freq: Once | ORAL | Status: AC
  Administered 2016-02-15: 40 meq via ORAL
  Filled 2016-02-15: qty 2

## 2016-02-15 MED ORDER — FERUMOXYTOL INJECTION 510 MG/17 ML
510.0000 mg | Freq: Once | INTRAVENOUS | Status: DC
  Filled 2016-02-15: qty 17

## 2016-02-15 MED ORDER — HEPARIN SOD (PORK) LOCK FLUSH 100 UNIT/ML IV SOLN
500.0000 [IU] | Freq: Once | INTRAVENOUS | Status: AC | PRN
  Administered 2016-02-15: 500 [IU]
  Filled 2016-02-15: qty 5

## 2016-02-15 NOTE — Patient Instructions (Addendum)
Dehydration, Adult Dehydration is a condition in which you do not have enough fluid or water in your body. It happens when you take in less fluid than you lose. Vital organs such as the kidneys, brain, and heart cannot function without a proper amount of fluids. Any loss of fluids from the body can cause dehydration.  Dehydration can range from mild to severe. This condition should be treated right away to help prevent it from becoming severe. CAUSES  This condition may be caused by:  Vomiting.  Diarrhea.  Excessive sweating, such as when exercising in hot or humid weather.  Not drinking enough fluid during strenuous exercise or during an illness.  Excessive urine output.  Fever.  Certain medicines. RISK FACTORS This condition is more likely to develop in:  People who are taking certain medicines that cause the body to lose excess fluid (diuretics).   People who have a chronic illness, such as diabetes, that may increase urination.  Older adults.   People who live at high altitudes.   People who participate in endurance sports.  SYMPTOMS  Mild Dehydration  Thirst.  Dry lips.  Slightly dry mouth.  Dry, warm skin. Moderate Dehydration  Very dry mouth.   Muscle cramps.   Dark urine and decreased urine production.   Decreased tear production.   Headache.   Light-headedness, especially when you stand up from a sitting position.  Severe Dehydration  Changes in skin.   Cold and clammy skin.   Skin does not spring back quickly when lightly pinched and released.   Changes in body fluids.   Extreme thirst.   No tears.   Not able to sweat when body temperature is high, such as in hot weather.   Minimal urine production.   Changes in vital signs.   Rapid, weak pulse (more than 100 beats per minute when you are sitting still).   Rapid breathing.   Low blood pressure.   Other changes.   Sunken eyes.   Cold hands and feet.    Confusion.  Lethargy and difficulty being awakened.  Fainting (syncope).   Short-term weight loss.   Unconsciousness. DIAGNOSIS  This condition may be diagnosed based on your symptoms. You may also have tests to determine how severe your dehydration is. These tests may include:   Urine tests.   Blood tests.  TREATMENT  Treatment for this condition depends on the severity. Mild or moderate dehydration can often be treated at home. Treatment should be started right away. Do not wait until dehydration becomes severe. Severe dehydration needs to be treated at the hospital. Treatment for Mild Dehydration  Drinking plenty of water to replace the fluid you have lost.   Replacing minerals in your blood (electrolytes) that you may have lost.  Treatment for Moderate Dehydration  Consuming oral rehydration solution (ORS). Treatment for Severe Dehydration  Receiving fluid through an IV tube.   Receiving electrolyte solution through a feeding tube that is passed through your nose and into your stomach (nasogastric tube or NG tube).  Correcting any abnormalities in electrolytes. HOME CARE INSTRUCTIONS   Drink enough fluid to keep your urine clear or pale yellow.   Drink water or fluid slowly by taking small sips. You can also try sucking on ice cubes.  Have food or beverages that contain electrolytes. Examples include bananas and sports drinks.  Take over-the-counter and prescription medicines only as told by your health care provider.   Prepare ORS according to the manufacturer's instructions. Take sips  of ORS every 5 minutes until your urine returns to normal.  If you have vomiting or diarrhea, continue to try to drink water, ORS, or both.   If you have diarrhea, avoid:   Beverages that contain caffeine.   Fruit juice.   Milk.   Carbonated soft drinks.  Do not take salt tablets. This can lead to the condition of having too much sodium in your body  (hypernatremia).  SEEK MEDICAL CARE IF:  You cannot eat or drink without vomiting.  You have had moderate diarrhea during a period of more than 24 hours.  You have a fever. SEEK IMMEDIATE MEDICAL CARE IF:   You have extreme thirst.  You have severe diarrhea.  You have not urinated in 6-8 hours, or you have urinated only a small amount of very dark urine.  You have shriveled skin.  You are dizzy, confused, or both.   This information is not intended to replace advice given to you by your health care provider. Make sure you discuss any questions you have with your health care provider.   Document Released: 07/01/2005 Document Revised: 03/22/2015 Document Reviewed: 11/16/2014 Elsevier Interactive Patient Education 2016 [employer].   Hypokalemia Hypokalemia means that the amount of potassium in the blood is lower than normal.Potassium is a chemical, called an electrolyte, that helps regulate the amount of fluid in the body. It also stimulates muscle contraction and helps nerves function properly.Most of the body's potassium is inside of cells, and only a very small amount is in the blood. Because the amount in the blood is so small, minor changes can be life-threatening. CAUSES  Antibiotics.  Diarrhea or vomiting.  Using laxatives too much, which can cause diarrhea.  Chronic kidney disease.  Water pills (diuretics).  Eating disorders (bulimia).  Low magnesium level.  Sweating a lot. SIGNS AND SYMPTOMS  Weakness.  Constipation.  Fatigue.  Muscle cramps.  Mental confusion.  Skipped heartbeats or irregular heartbeat (palpitations).  Tingling or numbness. DIAGNOSIS  Your health care provider can diagnose hypokalemia with blood tests. In addition to checking your potassium level, your health care provider may also check other lab tests. TREATMENT Hypokalemia can be treated with potassium supplements taken by mouth or adjustments in your current medicines.  If your potassium level is very low, you may need to get potassium through a vein (IV) and be monitored in the hospital. A diet high in potassium is also helpful. Foods high in potassium are:  Nuts, such as peanuts and pistachios.  Seeds, such as sunflower seeds and pumpkin seeds.  Peas, lentils, and lima beans.  Whole grain and bran cereals and breads.  Fresh fruit and vegetables, such as apricots, avocado, bananas, cantaloupe, kiwi, oranges, tomatoes, asparagus, and potatoes.  Orange and tomato juices.  Red meats.  Fruit yogurt. HOME CARE INSTRUCTIONS  Take all medicines as prescribed by your health care provider.  Maintain a healthy diet by including nutritious food, such as fruits, vegetables, nuts, whole grains, and lean meats.  If you are taking a laxative, be sure to follow the directions on the label. SEEK MEDICAL CARE IF:  Your weakness gets worse.  You feel your heart pounding or racing.  You are vomiting or having diarrhea.  You are diabetic and having trouble keeping your blood glucose in the normal range. SEEK IMMEDIATE MEDICAL CARE IF:  You have chest pain, shortness of breath, or dizziness.  You are vomiting or having diarrhea for more than 2 days.  You faint. MAKE  SURE YOU:   Understand these instructions.  Will watch your condition.  Will get help right away if you are not doing well or get worse.   This information is not intended to replace advice given to you by your health care provider. Make sure you discuss any questions you have with your health care provider.   Document Released: 07/01/2005 Document Revised: 07/22/2014 Document Reviewed: 01/01/2013 Elsevier Interactive Patient Education [employer].

## 2016-02-15 NOTE — Progress Notes (Signed)
Patient complains of diarrhea approximately 12 episodes over a three day period of Saturday, Sunday and Monday. Patient reports she has an ileostomy, but yet had diarrhea from the rectum. Patient reports fever approximately 100. Patient states she took tylenol for fever which resolved yesterday. Patient reports she tried lomotil and imodium but no relief from diarrhea until yesterday. Patient states she called her surgeon Dr. Johney Maine, patient states he told her he thought she had a virus. Patient states she feels more fatigued than usual. Selena Lesser, NP notified. Order given and carried out to check CBC/CMET/MG stat. Patient verbalized understanding. Patient is to take 40 mEq potassium PO while in infusion today. Then, patient is to take 20 mEq of Potassium PO BID x 3 days, recheck labs Monday at office visit with Dr. Burr Medico and have a type and hold drawn as her hgb has dropped from 13.8 to 10.8 in 3 weeks time per Selena Lesser, NP. Patient to call our office with any further questions or concerns. Patient verbalized understanding to above mentioned plan.

## 2016-02-16 ENCOUNTER — Other Ambulatory Visit: Payer: Self-pay | Admitting: *Deleted

## 2016-02-16 MED ORDER — DIPHENOXYLATE-ATROPINE 2.5-0.025 MG PO TABS: 1.0000 | ORAL_TABLET | Freq: Four times a day (QID) | ORAL | 1 refills | Status: DC | PRN

## 2016-02-17 ENCOUNTER — Ambulatory Visit: Payer: No Typology Code available for payment source

## 2016-02-19 ENCOUNTER — Ambulatory Visit (HOSPITAL_BASED_OUTPATIENT_CLINIC_OR_DEPARTMENT_OTHER): Payer: No Typology Code available for payment source | Admitting: Hematology

## 2016-02-19 ENCOUNTER — Other Ambulatory Visit: Payer: Self-pay | Admitting: *Deleted

## 2016-02-19 ENCOUNTER — Ambulatory Visit (HOSPITAL_BASED_OUTPATIENT_CLINIC_OR_DEPARTMENT_OTHER): Payer: No Typology Code available for payment source

## 2016-02-19 ENCOUNTER — Ambulatory Visit: Payer: No Typology Code available for payment source

## 2016-02-19 ENCOUNTER — Telehealth: Payer: Self-pay | Admitting: *Deleted

## 2016-02-19 ENCOUNTER — Telehealth: Payer: Self-pay | Admitting: Hematology

## 2016-02-19 ENCOUNTER — Other Ambulatory Visit (HOSPITAL_BASED_OUTPATIENT_CLINIC_OR_DEPARTMENT_OTHER): Payer: No Typology Code available for payment source

## 2016-02-19 VITALS — BP 130/73 | HR 110 | Temp 99.1°F | Resp 18 | Ht 70.0 in | Wt 270.2 lb

## 2016-02-19 DIAGNOSIS — C20 Malignant neoplasm of rectum: Secondary | ICD-10-CM

## 2016-02-19 DIAGNOSIS — Z6841 Body Mass Index (BMI) 40.0 and over, adult: Secondary | ICD-10-CM

## 2016-02-19 DIAGNOSIS — R509 Fever, unspecified: Secondary | ICD-10-CM

## 2016-02-19 DIAGNOSIS — D638 Anemia in other chronic diseases classified elsewhere: Secondary | ICD-10-CM

## 2016-02-19 DIAGNOSIS — R630 Anorexia: Secondary | ICD-10-CM

## 2016-02-19 DIAGNOSIS — N739 Female pelvic inflammatory disease, unspecified: Secondary | ICD-10-CM

## 2016-02-19 DIAGNOSIS — E860 Dehydration: Secondary | ICD-10-CM | POA: Diagnosis not present

## 2016-02-19 DIAGNOSIS — K6289 Other specified diseases of anus and rectum: Secondary | ICD-10-CM

## 2016-02-19 DIAGNOSIS — G500 Trigeminal neuralgia: Secondary | ICD-10-CM

## 2016-02-19 DIAGNOSIS — K922 Gastrointestinal hemorrhage, unspecified: Secondary | ICD-10-CM

## 2016-02-19 DIAGNOSIS — I10 Essential (primary) hypertension: Secondary | ICD-10-CM

## 2016-02-19 DIAGNOSIS — Z95828 Presence of other vascular implants and grafts: Secondary | ICD-10-CM

## 2016-02-19 DIAGNOSIS — R197 Diarrhea, unspecified: Secondary | ICD-10-CM | POA: Diagnosis not present

## 2016-02-19 DIAGNOSIS — Z452 Encounter for adjustment and management of vascular access device: Secondary | ICD-10-CM | POA: Diagnosis not present

## 2016-02-19 DIAGNOSIS — F329 Major depressive disorder, single episode, unspecified: Secondary | ICD-10-CM

## 2016-02-19 LAB — CBC WITH DIFFERENTIAL/PLATELET
BASO%: 0.3 % (ref 0.0–2.0)
BASOS ABS: 0 10*3/uL (ref 0.0–0.1)
EOS%: 3.3 % (ref 0.0–7.0)
Eosinophils Absolute: 0.3 10*3/uL (ref 0.0–0.5)
HEMATOCRIT: 35.8 % (ref 34.8–46.6)
HGB: 11.5 g/dL — ABNORMAL LOW (ref 11.6–15.9)
LYMPH#: 0.9 10*3/uL (ref 0.9–3.3)
LYMPH%: 9 % — ABNORMAL LOW (ref 14.0–49.7)
MCH: 27.7 pg (ref 25.1–34.0)
MCHC: 32.1 g/dL (ref 31.5–36.0)
MCV: 86.3 fL (ref 79.5–101.0)
MONO#: 0.7 10*3/uL (ref 0.1–0.9)
MONO%: 6.6 % (ref 0.0–14.0)
NEUT#: 8.1 10*3/uL — ABNORMAL HIGH (ref 1.5–6.5)
NEUT%: 80.8 % — AB (ref 38.4–76.8)
PLATELETS: 539 10*3/uL — AB (ref 145–400)
RBC: 4.15 10*6/uL (ref 3.70–5.45)
RDW: 15.6 % — ABNORMAL HIGH (ref 11.2–14.5)
WBC: 10.1 10*3/uL (ref 3.9–10.3)

## 2016-02-19 LAB — COMPREHENSIVE METABOLIC PANEL
ALBUMIN: 2.7 g/dL — AB (ref 3.5–5.0)
ALK PHOS: 109 U/L (ref 40–150)
ALT: 11 U/L (ref 0–55)
AST: 9 U/L (ref 5–34)
Anion Gap: 13 mEq/L — ABNORMAL HIGH (ref 3–11)
BILIRUBIN TOTAL: 0.31 mg/dL (ref 0.20–1.20)
BUN: 5.7 mg/dL — AB (ref 7.0–26.0)
CO2: 25 mEq/L (ref 22–29)
Calcium: 10.3 mg/dL (ref 8.4–10.4)
Chloride: 104 mEq/L (ref 98–109)
Creatinine: 0.7 mg/dL (ref 0.6–1.1)
EGFR: >90 mL/min/{1.73_m2} (ref >90)
GLUCOSE: 140 mg/dL (ref 70–140)
Potassium: 3.1 mEq/L — ABNORMAL LOW (ref 3.5–5.1)
SODIUM: 142 meq/L (ref 136–145)
TOTAL PROTEIN: 7.9 g/dL (ref 6.4–8.3)

## 2016-02-19 LAB — IRON AND TIBC
%SAT: 10 % — ABNORMAL LOW (ref 21–57)
Iron: 18 ug/dL — ABNORMAL LOW (ref 41–142)
TIBC: 191 ug/dL — ABNORMAL LOW (ref 236–444)
UIBC: 173 ug/dL (ref 120–384)

## 2016-02-19 LAB — MAGNESIUM: Magnesium: 1.8 mg/dl (ref 1.5–2.5)

## 2016-02-19 MED ORDER — SODIUM CHLORIDE 0.9 % IJ SOLN
10.0000 mL | INTRAMUSCULAR | Status: DC | PRN
  Administered 2016-02-19: 10 mL
  Filled 2016-02-19: qty 10

## 2016-02-19 MED ORDER — HEPARIN SOD (PORK) LOCK FLUSH 100 UNIT/ML IV SOLN
500.0000 [IU] | Freq: Once | INTRAVENOUS | Status: DC | PRN
  Filled 2016-02-19: qty 5

## 2016-02-19 MED ORDER — HYDROCODONE-ACETAMINOPHEN 5-325 MG PO TABS: 1.0000 | ORAL_TABLET | Freq: Four times a day (QID) | ORAL | 0 refills | Status: DC | PRN

## 2016-02-19 MED ORDER — SODIUM CHLORIDE 0.9% FLUSH
10.0000 mL | INTRAVENOUS | Status: AC | PRN
  Administered 2016-02-19: 10 mL
  Filled 2016-02-19: qty 10

## 2016-02-19 MED ORDER — HEPARIN SOD (PORK) LOCK FLUSH 100 UNIT/ML IV SOLN
500.0000 [IU] | INTRAVENOUS | Status: AC | PRN
  Administered 2016-02-19: 500 [IU]
  Filled 2016-02-19: qty 5

## 2016-02-19 MED ORDER — LACTATED RINGERS IV SOLN
INTRAVENOUS | Status: DC
  Administered 2016-02-19: 17:00:00 via INTRAVENOUS

## 2016-02-19 MED ORDER — ALTEPLASE 2 MG IJ SOLR
2.0000 mg | Freq: Once | INTRAMUSCULAR | Status: AC
  Administered 2016-02-19: 2 mg
  Filled 2016-02-19: qty 2

## 2016-02-19 NOTE — Telephone Encounter (Signed)
Gave pt cal & avs °

## 2016-02-19 NOTE — Telephone Encounter (Signed)
Per staff phone call and POF I have schedueld appts. Scheduler advised of appts.  JMW

## 2016-02-19 NOTE — Progress Notes (Signed)
Positive blood return from [facility] at 1655.  IVF's initiated per orders. Patient tolerated well.  1L LR infused.  Will return on 02/20/16 for remainder of infusion and blood draw.  Patient aware and verbalizes understanding.

## 2016-02-19 NOTE — Patient Instructions (Signed)

## 2016-02-19 NOTE — Progress Notes (Signed)
Lindsay Little  [phone] [phone]) [phone]  Clinic Follow Up Note   Patient Care Team: [name], MD as PCP - General (Family Medicine) Michael Boston, MD as Consulting Physician (General Surgery) Truitt Merle, MD as Consulting Physician (Medical Oncology) Kyung Rudd, MD as Consulting Physician (Radiation Oncology) Teena Irani, MD as Consulting Physician (Gastroenterology) Raynelle Bring, MD as Consulting Physician (Urology) 02/19/2016  CHIEF COMPLAINTS:  Follow up rectal cancer   Oncology History   Rectal adenocarcinoma   Staging form: Colon and Rectum, AJCC 7th Edition     Clinical: T3, N2, M0 - Unsigned       Rectal adenocarcinoma s/p LAR resection 05/25/2015   01/13/2015 Initial Diagnosis    Rectal adenocarcinoma     01/13/2015 Procedure    Colonoscopy showed a sensitivity O nonobstructing mass in the rectum and from 12-18 cm proximal to the Annis. The mass was circumferential, measuring about 6 cm in length. EGD was negative.     01/17/2015 Tumor Marker    CEA 1.4, CA-19-9 8, MMR normal      01/18/2015 Procedure    Lower endoscopic ultrasound by Dr. Paulita Little showed a T3 N2 rectal mass.     01/20/2015 Imaging    CT abdomen and pelvis with contrast showed right lateral rectal wall exophytic mass, and 2 ill-defined hepatic hypoenhancing lesions which appears to correspond to the lesion seen on the prior MRI in 2015.     02/05/2015 Imaging    abdomen MRI showed 2 hemangioma, no suspicion for metastatic disease. CT chest was negative     02/20/2015 - 03/30/2015 Radiation Therapy    neoadjuvant RT to rectal cancer      02/20/2015 Concurrent Chemotherapy    capecitabine 2500 mg in the morning and 2000 mg in the evening (848m/m2, bid), on the day of radiation.      05/25/2015 Surgery    Recto-sigmoid segmental resection, margins are negative      05/25/2015 Pathology Results    0.2cm residual invasive adenocarcinoma, G2, negative margins, 12 nodes were  negative      06/23/2015 - 06/29/2015 Hospital Admission    Patient was admitted for pelvic abscess, drain placed and she was treated with IV antibiotics, she also received 1 dose of Feraheme for anemia.     09/15/2015 - 09/17/2015 Hospital Admission    Recurrent pelvic collection s/p perc drainage 09/16/2015      HISTORY OF PRESENTING ILLNESS:  Lindsay TOWLE50y.o. female is here because of evaluation for management for newly diagnosed rectal adenocarcinoma.   On 09/06/13, she presented to Dr. HStarr SinclairGI] with 418-monthistory of diarrhea and worsening abdominal pain. She has a history of chronic abdominal pain and had been previously diagnosed [place] by Dr. HaAmedeo PlentyAbdominal [place]/6/15 noted to have some hypoechoicity in the left hepatic lobe which was confirmed on MRI to be two hemangiomas  in the right hepatic lobe of measuring up to 3.0 cm along with an additional 7 mm probable cyst.   She presented with worsening of diarrhea for 6 month and was reevaluated by Dr. HaAmedeo PlentyShe was seen on 12/06/14 at which time it was decided she would undergo HIDA scan given that the pain was intermittently in the RUQ in addition to upper and lower endoscopy. HIDA scan 12/15/14 was reassuring, but she was found to have a malignant tumor of the rectum about 12-18  cm proximal to the anus, gastritis, gastric polyps on endoscopies 01/13/15. Biopsies for these three specimens along with the duodenum were collected and resulted on 01/17/15 for invasive adenocarcinoma, chronic gastritis [H. Pylori negative], fundic gland polyps, and non-specific mild patchy intra-epithelial lymphocytes. Endoscopic [place] 01/18/15 staged the mass at T3N2Mx and she is here today for consideration of neoadjuvant therapy.    Today, she does report abdominal pain but is no different than her baseline [right-sided, upper, lower] but has had worsening appetite over the  last 2 months. She does have diarrhea with bowel movements every 1-2 hours, especially after eating, and has been noticing bleeding with her bowel movement following her endoscopic evaluation. She also feels her energy level has decreased. Otherwise, she denies nausea, chest pain, shortness of breath.    Family history is notable for a maternal uncle who had pancreatic cancer diagnosed in his mid to late 31s that spread to the colon and prostate before he died about 81 years later and a sister with non-melanoma skin cancer. 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 She smoked roughly 20 pack years [1 pack/week x 7 years] in the past and reports occasional alcohol use but denies any prior illicit drug use.  She lost about 7 lbs in the past 2 months  CURRENT THERAPY: supportive care, IVF twice a week    INTERIM HISTORY Lindsay Little returns for follow-up. She had worsening diarrhea, rectal discharge with for smells, nausea, for 3-4 days about 10 days ago, gradually improved afterwards. She has had low-grade fever since then, no chills. However she had the above symptoms began about 3 days ago, and diffuse quite fatigued. She came in today for follow-up and IV fluids.    HISTORY:  Past Medical History:  Diagnosis Date  . Anemia   . Anxiety   . Bulging disc    L5  . Cancer ([place])   . Complication of anesthesia   . 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 Depression   . Fibroids   . GERD (gastroesophageal reflux disease)   . Heart murmur   . History of blood transfusion   . History of chemotherapy   . History of frequent ear infections   . History of frequent urinary tract infections    middle school age  . History of hiatal hernia   . History of mitral valve prolapse   . Hypercholesteremia   . Hypertension   . IBS (irritable bowel syndrome)   . Mild sleep apnea    does not use CPAP  . Numbness and tingling    great  toe bilat left more than right   . Obesity   . Occasional tremors   . Osteoarthritis, knee   . PONV (postoperative nausea and vomiting)   . Rectal bleeding   . S/P radiation therapy 02/20/15-03/30/15   colon/rectal  . Tear of left rotator cuff   . TMJ (dislocation of temporomandibular joint)   . Vertigo   . Vitamin D deficiency   . Wears glasses     SURGICAL HISTORY: Past Surgical History:  Procedure Laterality Date  . ABDOMINAL HYSTERECTOMY  1996   Removed uterus, and tubes  . DILATION AND CURETTAGE OF UTERUS     times 2  . EUS N/A 01/18/2015   Procedure: LOWER ENDOSCOPIC ULTRASOUND (EUS);  Surgeon: Arta Silence, MD;  Location: [facility];  Service: Endoscopy;  Laterality: N/A;  . EXCISION  OF SKIN TAG  11/17/2015   Procedure: EXCISION OF SKIN TAG;  Surgeon: Michael Boston, MD;  Location: [facility];  Service: General;;  . ILEO LOOP COLOSTOMY CLOSURE N/A 11/17/2015   Procedure: LAPAROSCOPIC DIVERTING LOOP ILEOSTOMY  DRAINAGE OF PELVIC ABSCESS;  Surgeon: Michael Boston, MD;  Location: [facility];  Service: General;  Laterality: N/A;  . IMPACTION REMOVAL  11/17/2015   Procedure: DISIMPACTION REMOVAL;  Surgeon: Michael Boston, MD;  Location: [facility];  Service: General;;  . JP drain     . LAPAROSCOPIC LYSIS OF ADHESIONS  11/17/2015   Procedure: LAPAROSCOPIC LYSIS OF ADHESIONS;  Surgeon: Michael Boston, MD;  Location: [facility];  Service: General;;  . left knee arthroscopy     mult  . PORTACATH PLACEMENT N/A 05/25/2015   Procedure: INSERTION PORT-A-CATH;  Surgeon: Michael Boston, MD;  Location: [facility];  Service: General;  Laterality: N/A;  . ROTATOR CUFF REPAIR    . TONSILLECTOMY    . XI ROBOTIC ASSISTED LOWER ANTERIOR RESECTION N/A 05/25/2015   Procedure: XI ROBOTIC ASSISTED LOWER ANTERIOR RESECTION, , RIGID PROCTOSCOPY, RIGHT OOPHORECTOMY;  Surgeon: Michael Boston, MD;  Location: [facility];  Service: General;  Laterality: N/A;    SOCIAL HISTORY: Social History   Social History  . Marital status: Married     Spouse name: N/A  . Number of children: N/A  . Years of education: N/A   Occupational History  . Not on file.   Social History Main Topics  . Smoking status: Former Smoker    Packs/day: 0.50    Years: 7.00    Types: Cigarettes    Quit date: 07/16/1991  . Smokeless tobacco: Never Used     Comment: 1 pack/week intermittently x 7 years  . Alcohol use 0.6 oz/week    1 Shots of liquor per week     Comment: rarely   . Drug use: No  . Sexual activity: Not on file   Other Topics Concern  . Not on file   Social History Narrative   Tobacco Use: Cigarettes - Former Smoker   Alcohol: Yes, very rare, liquor.    No recreational drug use   Occupation: Head CMA @ [employer]   Marital Status: Married    Husband: Roselyn Reef Disabled   Children: 2 adopted kids [place]   Religion: [employer] [place] HISTORY: Family History  Problem Relation Age of Onset  . Coronary artery disease Mother 76  . Hypertension Mother   . Hyperlipidemia Mother   . Diabetes Mellitus I Mother   . Coronary artery disease Father   . Hyperlipidemia Father   . Hypertension Father   . Cancer Sister     skin - non melanoma  . Hyperlipidemia Brother   . Cancer Maternal Uncle 45    pancreatic with mets to colon and prostate  . Cirrhosis Maternal Uncle   . Hypertension Maternal Grandmother   . Diabetes Mellitus I Maternal Grandmother   . Hyperlipidemia Maternal Grandmother   . CVA Maternal Grandmother   . Hypertension Maternal Grandfather   . Coronary artery disease Maternal Grandfather 65  . Hyperlipidemia Maternal Grandfather   . Coronary artery disease Paternal Grandmother   . Hypertension Paternal Grandmother   . Hyperlipidemia Paternal Grandmother   . Diabetes Mellitus I Paternal Grandmother   . Hypertension Paternal Grandfather   . Hyperlipidemia Paternal Grandfather   . Coronary  artery disease Paternal Grandfather     ALLERGIES:  is allergic to caine-1  [lidocaine]; flagyl [metronidazole]; adhesive [tape]; iron; oxycodone; penicillins; and sulfa antibiotics.  MEDICATIONS:  Current Outpatient Prescriptions  Medication Sig Dispense Refill  . BYSTOLIC 10 MG tablet Take 10 mg by mouth daily. Reported on 01/22/2016  0  . Coenzyme Q10 (COQ-10) 100 MG CAPS Take 100 mg by mouth daily.    . diphenoxylate-atropine (LOMOTIL) 2.5-0.025 MG tablet Take 1 tablet by mouth 4 (four) times daily as needed for diarrhea or loose stools. 30 tablet 1  . DULoxetine (CYMBALTA) 60 MG capsule Take 60 mg by mouth at bedtime.     Marland Kitchen esomeprazole (NEXIUM) 40 MG capsule Take 1 capsule (40 mg total) by mouth 2 (two) times daily before a meal. 60 capsule 5  . HYDROcodone-acetaminophen (NORCO/VICODIN) 5-325 MG tablet Take 1-2 tablets by mouth every 6 (six) hours as needed for moderate pain or severe pain. 40 tablet 0  . hyoscyamine (LEVBID) 0.375 MG 12 hr tablet Take 0.375 mg by mouth daily.     Marland Kitchen lidocaine-prilocaine (EMLA) cream Apply to affected area once (Patient taking differently: Apply 1 application topically daily as needed (For port-a-cath.). ) 30 g 3  . meloxicam (MOBIC) 15 MG tablet Take 15 mg by mouth daily as needed for pain.     . methocarbamol (ROBAXIN) 500 MG tablet Take 2 tablets (1,000 mg total) by mouth every 6 (six) hours as needed for muscle spasms. (Patient taking differently: Take 500-1,000 mg by mouth every 8 (eight) hours as needed for muscle spasms. ) 20 tablet 3  . ondansetron (ZOFRAN) 8 MG tablet Take 1 tablet (8 mg total) by mouth 2 (two) times daily. Start the day after chemo for 2 days. Then take as needed for nausea or vomiting. (Patient taking differently: Take 8 mg by mouth 2 (two) times daily as needed for nausea or vomiting. Start the day after chemo for 2 days. Then take as needed for nausea or vomiting.) 10 tablet 3  . potassium chloride SA (K-DUR,KLOR-CON) 20 MEQ tablet Take 1 tablet (20 mEq total) by mouth 2 (two) times daily. 6 tablet 0  .  promethazine (PHENERGAN) 25 MG tablet Take 25 mg by mouth every 8 (eight) hours as needed for nausea or vomiting.     . psyllium (METAMUCIL) 58.6 % powder Take 1 packet by mouth 2 (two) times daily.    . temazepam (RESTORIL) 15 MG capsule Take 15 mg by mouth at bedtime as needed for sleep (one to three hours before bed).    . traMADol (ULTRAM) 50 MG tablet Take 50 mg by mouth 2 (two) times daily as needed for moderate pain. pain  2  . Vitamin D, Ergocalciferol, (DRISDOL) 50000 UNITS CAPS capsule Take 50,000 Units by mouth 2 (two) times a week.     No current facility-administered medications for this visit.     REVIEW OF SYSTEMS:   Constitutional: Denies fevers, chills or abnormal night sweats Eyes: Denies blurriness of vision, double vision or watery eyes Ears, nose, mouth, throat, and face: Denies mucositis or sore throat Respiratory: Denies cough, dyspnea or wheezes Cardiovascular: Denies palpitation, chest discomfort or lower extremity swelling Gastrointestinal:(+) abdominal pain  Skin: Denies abnormal skin rashes Lymphatics: Denies new lymphadenopathy or easy bruising Neurological:Denies numbness, tingling or new weaknesses Behavioral/Psych: Mood is stable, no new changes  All other systems were reviewed with the patient and are negative.  PHYSICAL EXAMINATION: ECOG PERFORMANCE STATUS: 2-3  Vitals:  02/19/16 1331  BP: 130/73  Pulse: (!) 110  Resp: 18  Temp: 99.1 F (37.3 C)   Filed Weights   02/19/16 1331  Weight: 270 lb 3.2 oz (122.6 kg)    GENERAL:alert, no distress and comfortable SKIN: skin color, texture, turgor are normal, no rashes or significant lesions EYES: normal, conjunctiva are pink and non-injected, sclera clear OROPHARYNX:no exudate, no erythema and lips, buccal mucosa, and tongue normal  NECK: supple, thyroid normal size, non-tender, without nodularity LYMPH:  no palpable lymphadenopathy in the cervical, axillary or inguinal LUNGS: clear to  auscultation and percussion with normal breathing effort HEART: regular rate & rhythm and no murmurs and no lower extremity edema ABDOMEN:abdomen soft, normal bowel sounds. No organomegaly,  Multiple surgical incision scar are well healed. (+) Ileostomy back in the right side abdomen  Musculoskeletal:no cyanosis of digits and no clubbing  PSYCH: alert & oriented x 3 with fluent speech NEURO: no focal motor/sensory deficits   LABORATORY DATA:  I have reviewed the data as listed CBC Latest Ref Rng & Units 02/19/2016 02/15/2016 01/29/2016  WBC 3.9 - 10.3 10e3/uL 10.1 8.6 9.4  Hemoglobin 11.6 - 15.9 g/dL 11.5(L) 10.8(L) 13.8  Hematocrit 34.8 - 46.6 % 35.8 33.1(L) 43.4  Platelets 145 - 400 10e3/uL 539(H) 296 431(H)    CMP Latest Ref Rng & Units 02/19/2016 02/15/2016 01/29/2016  Glucose 70 - 140 mg/dl 140 126 128  BUN 7.0 - 26.0 mg/dL 5.7(L) 7.0 13.7  Creatinine 0.6 - 1.1 mg/dL 0.7 0.7 0.8  Sodium 136 - 145 mEq/L 142 139 139  Potassium 3.5 - 5.1 mEq/L 3.1(L) 3.0(LL) 4.6  Chloride 101 - 111 mmol/L - - -  CO2 22 - 29 mEq/L 25 23 19(L)  Calcium 8.4 - 10.4 mg/dL 10.3 9.7 10.5(H)  Total Protein 6.4 - 8.3 g/dL 7.9 7.0 7.9  Total Bilirubin 0.20 - 1.20 mg/dL 0.31 0.43 <0.30  Alkaline Phos 40 - 150 U/L 109 96 81  AST 5 - 34 U/L 9 9 15   ALT 0 - 55 U/L 11 <9 10   CEA  Status: Finalresult Visible to patient:  Not Released Nextappt: Today at 09:45 AM in Oncology Burr Medico, Krista Blue, MD) Dx:  Rectal adenocarcinoma [facility])           Ref Range 3d ago  80moago     CEA 0.0 - 4.7 ng/mL 1.9 1.5CM         PATHOLOGY REPORT Diagnosis 05/25/2015 1. Colon, segmental resection for tumor, recto-sigmoid INVASIVE COLONIC ADENOCARCINOMA (0.2 CM IN GREATEST DIMENSION RESIDUAL TUMOR) THE TUMOR INVADE SUBMUCOSA (0.3 CM IN DEPTH, PT1) MARGINS OF RESECTION ARE NEGATIVE FOR TUMOR TWELVE BENIGN LYMPH NODES (0/12) 2. Colon, resection margin (donut), proximal anastomotic ring BENIGN COLONIC TISSUE, NEGATIVE  FOR MALIGANCY 3. Colon, resection margin (donut), distal anastomotic ring BENIGN COLONIC TISSUE, NEGATIVE FOR MALIGNANCY 4. Ovary, right BENIGN SEROUS CYSTADENOMA.  Microscopic Comment 1. COLON AND RECTUM (INCLUDING TRANS-ANAL RESECTION): Specimen: Colon-rectum Procedure: Segmental resection Tumor site: Rectum anterior wall mid to distal third of rectum Specimen integrity: intact Macroscopic intactness of mesorectum: Complete: _ Macroscopic tumor perforation: Negative Invasive tumor: Maximum size: Histologic type(s): G2 Histologic grade and differentiation: G1: well differentiated/low grade G2: moderately differentiated/low grade G3: poorly differentiated/high grade G4: undifferentiated/high grade Type of polyp in which invasive carcinoma arose: _ Microscopic extension of invasive tumor:Submucosa Lymph-Vascular invasion: Not identified Peri-neural invasion: negative Tumor deposit(s) (discontinuous extramural extension): Negative Resection margins: Proximal margin: Negative Distal margin: Negative Circumferential (radial) (posterior ascending, posterior descending; lateral  and posterior mid-rectum; and entire lower 1/3 rectum):Negatvie Mesenteric margin (sigmoid and transverse): Negative Distance closest margin (if all above margins negative): 2.5 cm Treatment effect (neo-adjuvant therapy): Present Additional polyp(s): Negative Non-neoplastic findings: Unremarkable Lymph nodes: number examined 12; number positive: 0 Pathologic Staging: T1, N0, M_ Ancillary studies: Preserved expression of the makor and minor MMR proteins MLH1, MSH2, MSH6 and PMS2. MLH1:    RADIOGRAPHIC STUDIES: I have personally reviewed the radiological images as listed and agreed with the findings in the report.  Ct Abdomen Pelvis W Contrast 01/20/2015    IMPRESSION: Right lateral rectal wall exophytic mass likely corresponding to the known neoplasm.  Two ill-defined hepatic hypo enhancing lesions  which appears to correspond to the lesions seen on the prior MRI. Please note evaluation of the liver is very limited due to streak artifact. If there is clinical concern for new metastatic disease MRI may provide better evaluation.  Small left ovarian cystic lesion corresponding to the lesion reported on the prior pelvic ultrasound. Ultrasound may provide better evaluation of the pelvic structures.   Electronically Signed   By: Anner Crete M.D.   On: 01/20/2015 16:15   Lower endoscopic ultrasound by Dr. Paulita Little 01/18/2015 FINDINGS: Normal digital rectal exam; could not palpate mass. In proximal rectum extending to the distal rectum, from 12 cm to 20 cm from the anal verge, a partial circumferential (50-75% circumferential distally, 75-100% circumferential proximall) mass was seen. Mass was firm, fixed, ulcerated, friable. EUS radial scope was able to traverse the tumor into normal-appearing distal sigmoid colon. Lesion invaded through the wall of the rectum into neighboring connective tissues in multiple regions. There were several round hypoechoic well-defined malignant-appearing lymph nodes in the vicinity of the proximal portion of the tumor. STAGING: T3 N2 Mx via rectal ultrasound ENDOSCOPIC IMPRESSION: As above. Rectosigmoid mass, biopsy-proven adenocarcinoma, with local invasion noted under ultrasound. RECOMMENDATIONS: 1. Watch for potential complications of procedure. 2. Needs CT chest, abdomen, pelvis to complete staging. 3. Based already on rectal ultrasound results, patient would not be candidate for upfront surgery. She will need surgical and radiation oncology consultations. 4. Will discuss with Dr. Amedeo Little.  CT chest 02/07/2015 IMPRESSION: 1. No findings of metastatic disease to the chest.  Abdomen MRI w and wo contrast 02/05/2015  IMPRESSION: 1. Small cavernous hemangiomas in segments 5 and 7 of the liver redemonstrated, as above. Additional 7 mm indeterminate lesion is also  favored to represent a tiny cavernous hemangioma, but may alternatively represent a small cyst. No new suspicious appearing hepatic lesions to suggest presence of metastatic disease at this time. 2. Mild hepatic steatosis.   ASSESSMENT & PLAN: Lindsay Little is a 50 year old female with chronic abdominal pain with rectal mass on colonoscopy found to be invasive rectal adenocarcinoma.   1. T3N2M0, Stage IIIC rectal adenocarcinoma, ypT1N0 after neoadjuvant chemotherapy and radiation -I previously reviewed her  Surgical pathology result in great detail with patient and her husband. She has had a good response to neoadjuvant chemoradiation, has a small residual tumor on the surgical specimen, no  Lymph node metastasis. -I recommended adjuvant chemotherapy, however she has not been able to take it due to the pelvic abscess after surgery. - We'll continue her surveillance. She had a multiple CT of the abdomen and pelvis after surgery, last one in June 2017, which did not  Show evidence of recurrence. Her tumor marker CEA has been normal.  -Due to her abscess, she would like to postpone her colonoscopy  2. Diarrhea and  low grade fever, history of Pelvic abscess -She unfortunately developed a recurrent pelvic abscess after her surgery,  managed by her surgeon Dr. Johney Maine, she is status post ileostomy, draining tube has been removed. -Due to her recent episodes of worsening symptoms, especially rectal discharge, low-grade fever, I'll obtain a repeat a CT scan in the next few days, and we'll obtain blood culture from her port and peripheral today. -IV fluids with linger 1.5 L twice a week   3. Microcytic anemia, secondary to GI blood loss and iron deficiency anemia -she responded to IV for him very well. Her hemoglobin dropped to 6.4 when she was in the hospital a months ago, now has improved to 11.3 after 2 doses of IV Feraheme -We'll continue monitoring her I'll level  4. HTN, depression -She'll  continue follow-up with her primary care physician -Her HTN medication Has Been Held Due To Her Dehydration after Hospitalization, her blood pressure fluctuates, we'll monitor for now.  6.  Anorexia and depression -She tries mirtazapine, did not help. - I encouraged her to take nutrition supplement  7. Dehydration  -She still has intermittent high ileostomy output, requires IV fluids -Continue IV fluids, which changed to lactate linger 1.5 L twice a week for the next 3 weeks, and reassess her in 3 weeks   Plan: -Lactate linger  1.5 L every Monday and Thursday for the next 3 weeks -lab weekly -blood culture X2 from port and peripheral  -CT abdomen and pelvis with contrast within the next 2 days  -I'll see her back in 3 weeks, sooner if needed    I spent 30 mins for the visit, >50% face to face discussion.   Truitt Merle  02/19/2016

## 2016-02-19 NOTE — Progress Notes (Signed)
1620: able to see some blood return, but unable to pull back more than 1cc. Per Royce Macadamia RN per Dr. Burr Medico let TPA work until able to waste at least 5cc or up to 2 hours. Pt can start PIV for fluids or come back 02/20/16 for IVF. Per pt she would like to return 02/20/16 for IVF. Per Jan RN per Dr. Burr Medico obtain blood culture from port 02/20/16 instead of 02/19/16. Pt aware and verbalizes understanding. Report given to Rodney Langton RN

## 2016-02-20 ENCOUNTER — Other Ambulatory Visit: Payer: Self-pay | Admitting: *Deleted

## 2016-02-20 ENCOUNTER — Encounter: Payer: Self-pay | Admitting: Hematology

## 2016-02-20 ENCOUNTER — Ambulatory Visit: Payer: No Typology Code available for payment source

## 2016-02-20 ENCOUNTER — Telehealth: Payer: Self-pay | Admitting: *Deleted

## 2016-02-20 ENCOUNTER — Ambulatory Visit (HOSPITAL_BASED_OUTPATIENT_CLINIC_OR_DEPARTMENT_OTHER): Payer: No Typology Code available for payment source

## 2016-02-20 VITALS — BP 126/61 | HR 93 | Temp 99.6°F | Resp 18

## 2016-02-20 DIAGNOSIS — C20 Malignant neoplasm of rectum: Secondary | ICD-10-CM

## 2016-02-20 DIAGNOSIS — A419 Sepsis, unspecified organism: Secondary | ICD-10-CM | POA: Diagnosis not present

## 2016-02-20 MED ORDER — LACTATED RINGERS IV SOLN: Freq: Once | INTRAVENOUS | Status: DC

## 2016-02-20 MED ORDER — LACTATED RINGERS IV BOLUS (SEPSIS)
500.0000 mL | Freq: Once | INTRAVENOUS | Status: DC
  Filled 2016-02-20: qty 500

## 2016-02-20 MED ORDER — SODIUM CHLORIDE 0.9% FLUSH
10.0000 mL | INTRAVENOUS | Status: DC | PRN
  Administered 2016-02-20: 10 mL via INTRAVENOUS
  Filled 2016-02-20: qty 10

## 2016-02-20 MED ORDER — HEPARIN SOD (PORK) LOCK FLUSH 100 UNIT/ML IV SOLN
500.0000 [IU] | Freq: Once | INTRAVENOUS | Status: AC
  Administered 2016-02-20: 500 [IU] via INTRAVENOUS
  Filled 2016-02-20: qty 5

## 2016-02-20 MED ORDER — LACTATED RINGERS IV SOLN
500.0000 mL | Freq: Once | INTRAVENOUS | Status: AC
  Administered 2016-02-20: 500 mL via INTRAVENOUS
  Filled 2016-02-20: qty 500

## 2016-02-20 NOTE — Patient Instructions (Signed)

## 2016-02-20 NOTE — Progress Notes (Signed)
1422: had to flush port a cath to get blood return, only able to obtain 5cc. 2.5cc placed in each blood culture bottle. Dr. Burr Medico aware and states that if lab will accept sample then send it, if not discard sample and cancel order. Sample taken to lab and lab informed of situation, they state they will see if it can be ran and inform Dr. Burr Medico if not.  1510: pt remained accessed per request for CT scan on 02/21/16.

## 2016-02-20 NOTE — Telephone Encounter (Signed)
Spoke with pt in infusion room today.  Gave pt written instructions along with contrast for CT scan to be done at [facility] radiology tomorrow 02/21/16 at 2 pm.  Pt voiced understanding.

## 2016-02-21 ENCOUNTER — Encounter: Payer: Self-pay | Admitting: Nurse Practitioner

## 2016-02-21 ENCOUNTER — Ambulatory Visit (HOSPITAL_COMMUNITY)
Admission: RE | Admit: 2016-02-21 | Discharge: 2016-02-21 | Disposition: A | Discharge status: Home / Self Care | Payer: No Typology Code available for payment source | Source: Ambulatory Visit | Attending: Hematology | Admitting: Hematology

## 2016-02-21 ENCOUNTER — Encounter (HOSPITAL_COMMUNITY): Payer: Self-pay

## 2016-02-21 DIAGNOSIS — Z932 Ileostomy status: Secondary | ICD-10-CM | POA: Insufficient documentation

## 2016-02-21 DIAGNOSIS — N739 Female pelvic inflammatory disease, unspecified: Secondary | ICD-10-CM

## 2016-02-21 DIAGNOSIS — D1800 Hemangioma unspecified site: Secondary | ICD-10-CM | POA: Insufficient documentation

## 2016-02-21 DIAGNOSIS — N732 Unspecified parametritis and pelvic cellulitis: Secondary | ICD-10-CM | POA: Insufficient documentation

## 2016-02-21 DIAGNOSIS — R197 Diarrhea, unspecified: Secondary | ICD-10-CM | POA: Insufficient documentation

## 2016-02-21 DIAGNOSIS — K760 Fatty (change of) liver, not elsewhere classified: Secondary | ICD-10-CM | POA: Insufficient documentation

## 2016-02-21 MED ORDER — IOPAMIDOL (ISOVUE-300) INJECTION 61%
100.0000 mL | Freq: Once | INTRAVENOUS | Status: AC | PRN
  Administered 2016-02-21: 100 mL via INTRAVENOUS

## 2016-02-21 NOTE — Assessment & Plan Note (Signed)
Patient continues with observation only and supportive care.  She has been returning to the [facility] for IV fluid rehydration at least twice weekly for continued dehydration secondary to high output ileostomy.  Patient is scheduled to return for labs, flush, and a visit on 02/19/2016.

## 2016-02-21 NOTE — Assessment & Plan Note (Signed)
Patient has a history of chronic high output stool from her ileostomy.  She states that she typically alternates between Imodium and Lomotil as directed.  However, patient states that she has recently had a small amount of stool leakage from her rectum as well.  She states she caught her general surgeon, Dr. gross to report this finding; and he advised that it was most likely secondary to a mild viral illness.  He advised patient to continue to monitor and to let him know if there was any worsening symptoms whatsoever.

## 2016-02-21 NOTE — Assessment & Plan Note (Signed)
Assessment andPatient has a Shehistory of chronic high output stool from her ileostomy.  She states that she typically alternates between Imodium and Lomotil as directed.  However, patient states that she has recently had a small amount of stool leakage from her rectum as well.  She states she caught her general surgeon, Dr. gross to report this finding; and he advised that it was most likely secondary to a mild viral illness.  He advised patient to continue to monitor and to let him know if there was any worsening symptoms whatsoever.

## 2016-02-21 NOTE — Progress Notes (Signed)
SYMPTOM MANAGEMENT CLINIC    Chief Complaint: Chronic diarrhea and dehydration.  HPI:  Lindsay Little 50 y.o. female diagnosed with rectal cancer.  Currently undergoing observation only.  Patient presented to the [facility] today for hydration.  She reports continued, chronic high output stool/diarrhea to her ileostomy.  She feels dehydrated today as well.   Oncology History   Rectal adenocarcinoma   Staging form: Colon and Rectum, AJCC 7th Edition     Clinical: T3, N2, M0 - Unsigned       Rectal adenocarcinoma s/p LAR resection 05/25/2015   01/13/2015 Initial Diagnosis    Rectal adenocarcinoma     01/13/2015 Procedure    Colonoscopy showed a sensitivity O nonobstructing mass in the rectum and from 12-18 cm proximal to the Annis. The mass was circumferential, measuring about 6 cm in length. EGD was negative.     01/17/2015 Tumor Marker    CEA 1.4, CA-19-9 8, MMR normal      01/18/2015 Procedure    Lower endoscopic ultrasound by Dr. Paulita Fujita showed a T3 N2 rectal mass.     01/20/2015 Imaging    CT abdomen and pelvis with contrast showed right lateral rectal wall exophytic mass, and 2 ill-defined hepatic hypoenhancing lesions which appears to correspond to the lesion seen on the prior MRI in 2015.     02/05/2015 Imaging    abdomen MRI showed 2 hemangioma, no suspicion for metastatic disease. CT chest was negative     02/20/2015 - 03/30/2015 Radiation Therapy    neoadjuvant RT to rectal cancer      02/20/2015 Concurrent Chemotherapy    capecitabine 2500 mg in the morning and 2000 mg in the evening (876m/m2, bid), on the day of radiation.      05/25/2015 Surgery    Recto-sigmoid segmental resection, margins are negative      05/25/2015 Pathology Results    0.2cm residual invasive adenocarcinoma, G2, negative margins, 12 nodes were negative      06/23/2015 - 06/29/2015 Hospital Admission    Patient was admitted for pelvic abscess, drain placed and she was treated with IV  antibiotics, she also received 1 dose of Feraheme for anemia.     09/15/2015 - 09/17/2015 Hospital Admission    Recurrent pelvic collection s/p perc drainage 09/16/2015      Review of Systems  Gastrointestinal: Positive for diarrhea.  All other systems reviewed and are negative.   Past Medical History:  Diagnosis Date  . Anemia   . Anxiety   . Bulging disc    L5  . Cancer ([place]   . Complication of anesthesia   . 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 Depression   . Fibroids   . GERD (gastroesophageal reflux disease)   . Heart murmur   . History of blood transfusion   . History of chemotherapy   . History of frequent ear infections   . History of frequent urinary tract infections    middle school age  . History of hiatal hernia   . History of mitral valve prolapse   . Hypercholesteremia   . Hypertension   . IBS (irritable bowel syndrome)   . Mild sleep apnea    does not use CPAP  . Numbness and tingling    great toe bilat left more than right   . Obesity   . Occasional tremors   . Osteoarthritis, knee   . PONV (postoperative nausea and vomiting)   . Rectal bleeding   .  S/P radiation therapy 02/20/15-03/30/15   colon/rectal  . Tear of left rotator cuff   . TMJ (dislocation of temporomandibular joint)   . Vertigo   . Vitamin D deficiency   . Wears glasses     Past Surgical History:  Procedure Laterality Date  . ABDOMINAL HYSTERECTOMY  1996   Removed uterus, and tubes  . DILATION AND CURETTAGE OF UTERUS     times 2  . EUS N/A 01/18/2015   Procedure: LOWER ENDOSCOPIC ULTRASOUND (EUS);  Surgeon: Arta Silence, MD;  Location: [facility];  Service: Endoscopy;  Laterality: N/A;  . EXCISION OF SKIN TAG  11/17/2015   Procedure: EXCISION OF SKIN TAG;  Surgeon: Michael Boston, MD;  Location: [facility];  Service: General;;  . ILEO LOOP COLOSTOMY CLOSURE N/A 11/17/2015   Procedure: LAPAROSCOPIC DIVERTING LOOP ILEOSTOMY  DRAINAGE OF PELVIC ABSCESS;  Surgeon: Michael Boston, MD;  Location: [facility];  Service: General;  Laterality: N/A;  . IMPACTION REMOVAL  11/17/2015   Procedure: DISIMPACTION REMOVAL;  Surgeon: Michael Boston, MD;  Location: [facility];  Service: General;;  . JP drain     . LAPAROSCOPIC LYSIS OF ADHESIONS  11/17/2015   Procedure: LAPAROSCOPIC LYSIS OF ADHESIONS;  Surgeon: Michael Boston, MD;  Location: [facility];  Service: General;;  . left knee arthroscopy     mult  . PORTACATH PLACEMENT N/A 05/25/2015   Procedure: INSERTION PORT-A-CATH;  Surgeon: Michael Boston, MD;  Location: [facility];  Service: General;  Laterality: N/A;  . ROTATOR CUFF REPAIR    . TONSILLECTOMY    . XI ROBOTIC ASSISTED LOWER ANTERIOR RESECTION N/A 05/25/2015   Procedure: XI ROBOTIC ASSISTED LOWER ANTERIOR RESECTION, , RIGID PROCTOSCOPY, RIGHT OOPHORECTOMY;  Surgeon: Michael Boston, MD;  Location: [facility];  Service: General;  Laterality: N/A;    has Pure hypercholesterolemia; Morbid obesity with BMI of 40.0-44.9, adult ([place]); Family history of ischemic heart disease; Rectal adenocarcinoma s/p LAR resection 05/25/2015; Cavernous hemangioma of liver - segments 5 & 7; Osteoarthritis of left knee; Crohn's disease ([place]); Hypertension; IBS (irritable bowel syndrome); Depression; Anemia of chronic disease; Pelvic abscess s/p drainage & omental pedicle flap 11/17/2015; Protein-calorie malnutrition, moderate ([place]); Large bowel delayed anastomotic leak s/p diverting loop ileostomy 11/17/2015; Ileostomy in place [facility]); Hypokalemia; Dehydration; Abdominal pain; Gastroenteritis; Hydroureter on right; Occlusion of Portacatheter central line; and Diarrhea on her problem list.    is allergic to caine-1 [lidocaine]; flagyl [metronidazole]; adhesive [tape]; iron; oxycodone; penicillins; and sulfa antibiotics.    Medication List       Accurate as of 02/15/16 11:59 PM. Always use your most recent med list.          BYSTOLIC 10 MG tablet Generic drug:  nebivolol Take 10 mg by mouth daily. Reported on  01/22/2016   CoQ-10 100 MG Caps Take 100 mg by mouth daily.   DULoxetine 60 MG capsule Commonly known as:  CYMBALTA Take 60 mg by mouth at bedtime.   esomeprazole 40 MG capsule Commonly known as:  NEXIUM Take 1 capsule (40 mg total) by mouth 2 (two) times daily before a meal.   HYDROcodone-acetaminophen 5-325 MG tablet Commonly known as:  NORCO/VICODIN Take 1-2 tablets by mouth every 6 (six) hours as needed for moderate pain or severe pain.   hyoscyamine 0.375 MG 12 hr tablet Commonly known as:  LEVBID Take 0.375 mg by mouth daily.   lidocaine-prilocaine cream Commonly known as:  EMLA Apply to affected area once   meloxicam 15 MG tablet Commonly known as:  MOBIC Take 15 mg by mouth daily as needed for pain.   methocarbamol 500 MG tablet Commonly known as:  ROBAXIN Take 2 tablets (1,000 mg total) by mouth every 6 (six) hours as needed for muscle spasms.   ondansetron 8 MG tablet Commonly known as:  ZOFRAN Take 1 tablet (8 mg total) by mouth 2 (two) times daily. Start the day after chemo for 2 days. Then take as needed for nausea or vomiting.   potassium chloride SA 20 MEQ tablet Commonly known as:  K-DUR,KLOR-CON Take 1 tablet (20 mEq total) by mouth 2 (two) times daily.   promethazine 25 MG tablet Commonly known as:  PHENERGAN Take 25 mg by mouth every 8 (eight) hours as needed for nausea or vomiting.   psyllium 58.6 % powder Commonly known as:  METAMUCIL Take 1 packet by mouth 2 (two) times daily as needed.   temazepam 15 MG capsule Commonly known as:  RESTORIL Take 15 mg by mouth at bedtime as needed for sleep (one to three hours before bed).   traMADol 50 MG tablet Commonly known as:  ULTRAM Take 50 mg by mouth 2 (two) times daily as needed for moderate pain. pain   Vitamin D (Ergocalciferol) 50000 units Caps capsule Commonly known as:  DRISDOL Take 50,000 Units by mouth 2 (two) times a week.        PHYSICAL EXAMINATION  Oncology Vitals 02/20/2016  02/20/2016  Height - -  Weight - -  Weight (lbs) - -  BMI (kg/m2) - -  Temp 99.6 -  Pulse 93 94  Resp - -  SpO2 - -  BSA (m2) - -   BP Readings from Last 2 Encounters:  02/20/16 126/61  02/19/16 130/73    Physical Exam  Constitutional: She is oriented to person, place, and time and well-developed, well-nourished, and in no distress.  HENT:  Head: Normocephalic and atraumatic.  Eyes: Conjunctivae and EOM are normal. Pupils are equal, round, and reactive to light.  Neck: Normal range of motion.  Pulmonary/Chest: Effort normal. No respiratory distress.  Musculoskeletal: Normal range of motion.  Neurological: She is alert and oriented to person, place, and time.  Skin: Skin is warm and dry.  Psychiatric: Affect normal.  Nursing note and vitals reviewed.   LABORATORY DATA:. Appointment on 02/15/2016  Component Date Value Ref Range Status  . WBC 02/15/2016 8.6  3.9 - 10.3 10e3/uL Final  . NEUT# 02/15/2016 7.0* 1.5 - 6.5 10e3/uL Final  . HGB 02/15/2016 10.8* 11.6 - 15.9 g/dL Final  . HCT 02/15/2016 33.1* 34.8 - 46.6 % Final  . Platelets 02/15/2016 296  145 - 400 10e3/uL Final  . MCV 02/15/2016 84.9  79.5 - 101.0 fL Final  . MCH 02/15/2016 27.7  25.1 - 34.0 pg Final  . MCHC 02/15/2016 32.6  31.5 - 36.0 g/dL Final  . RBC 02/15/2016 3.90  3.70 - 5.45 10e6/uL Final  . RDW 02/15/2016 15.8* 11.2 - 14.5 % Final  . lymph# 02/15/2016 0.7* 0.9 - 3.3 10e3/uL Final  . MONO# 02/15/2016 0.6  0.1 - 0.9 10e3/uL Final  . Eosinophils Absolute 02/15/2016 0.2  0.0 - 0.5 10e3/uL Final  . Basophils Absolute 02/15/2016 0.0  0.0 - 0.1 10e3/uL Final  . NEUT% 02/15/2016 81.5* 38.4 - 76.8 % Final  . LYMPH% 02/15/2016 8.5* 14.0 - 49.7 % Final  . MONO% 02/15/2016 7.0  0.0 - 14.0 % Final  . EOS% 02/15/2016 2.8  0.0 - 7.0 % Final  . BASO% 02/15/2016  0.2  0.0 - 2.0 % Final  . Sodium 02/15/2016 139  136 - 145 mEq/L Final  . Potassium 02/15/2016 3.0* 3.5 - 5.1 mEq/L Final  . Chloride 02/15/2016 104  98 -  109 mEq/L Final  . CO2 02/15/2016 23  22 - 29 mEq/L Final  . Glucose 02/15/2016 126  70 - 140 mg/dl Final  . BUN 02/15/2016 7.0  7.0 - 26.0 mg/dL Final  . Creatinine 02/15/2016 0.7  0.6 - 1.1 mg/dL Final  . Total Bilirubin 02/15/2016 0.43  0.20 - 1.20 mg/dL Final  . Alkaline Phosphatase 02/15/2016 96  40 - 150 U/L Final  . AST 02/15/2016 9  5 - 34 U/L Final  . ALT 02/15/2016 <9  0 - 55 U/L Final  . Total Protein 02/15/2016 7.0  6.4 - 8.3 g/dL Final  . Albumin 02/15/2016 2.6* 3.5 - 5.0 g/dL Final  . Calcium 02/15/2016 9.7  8.4 - 10.4 mg/dL Final  . Anion Gap 02/15/2016 12* 3 - 11 mEq/L Final  . EGFR 02/15/2016 >90  >90 ml/min/1.73 m2 Final  . Magnesium 02/15/2016 1.9  1.5 - 2.5 mg/dl Final    RADIOGRAPHIC STUDIES: No results found.  ASSESSMENT/PLAN:    Rectal adenocarcinoma s/p LAR resection 05/25/2015 Patient continues with observation only and supportive care.  She has been returning to the [facility] for IV fluid rehydration at least twice weekly for continued dehydration secondary to high output ileostomy.  Patient is scheduled to return for labs, flush, and a visit on 02/19/2016.  Diarrhea Patient has a history of chronic high output stool from her ileostomy.  She states that she typically alternates between Imodium and Lomotil as directed.  However, patient states that she has recently had a small amount of stool leakage from her rectum as well.  She states she caught her general surgeon, Dr. gross to report this finding; and he advised that it was most likely secondary to a mild viral illness.  He advised patient to continue to monitor and to let him know if there was any worsening symptoms whatsoever.    Dehydration Assessment andPatient has a Shehistory of chronic high output stool from her ileostomy.  She states that she typically alternates between Imodium and Lomotil as directed.  However, patient states that she has recently had a small amount of stool leakage from her  rectum as well.  She states she caught her general surgeon, Dr. gross to report this finding; and he advised that it was most likely secondary to a mild viral illness.  He advised patient to continue to monitor and to let him know if there was any worsening symptoms whatsoever.    Hypokalemia Potassium was decreased down to 3.0 today.  Will give patient potassium 40 mEq orally while at the [facility] today; and will also prescribed potassium 20 mg to take on a twice-daily basis as well.  Will recheck labs next week.   Patient stated understanding of all instructions; and was in agreement with this plan of care. The patient knows to call the clinic with any problems, questions or concerns.   Total time spent with patient was 15 minutes;  with greater than 75 percent of that time spent in face to face counseling regarding patient's symptoms,  and coordination of care and follow up.  Disclaimer:This dictation was prepared with Dragon/digital dictation along with [employer]. Any transcriptional errors that result from this process are unintentional.  Drue Second, NP 02/21/2016

## 2016-02-21 NOTE — Assessment & Plan Note (Signed)
Potassium was decreased down to 3.0 today.  Will give patient potassium 40 mEq orally while at the [facility] today; and will also prescribed potassium 20 mg to take on a twice-daily basis as well.  Will recheck labs next week.

## 2016-02-22 ENCOUNTER — Inpatient Hospital Stay (HOSPITAL_COMMUNITY): Admission: RE | Admit: 2016-02-22 | Payer: No Typology Code available for payment source | Source: Ambulatory Visit

## 2016-02-23 ENCOUNTER — Other Ambulatory Visit: Payer: Self-pay

## 2016-02-23 ENCOUNTER — Ambulatory Visit
Admission: RE | Admit: 2016-02-23 | Discharge: 2016-02-23 | Disposition: A | Discharge status: Home / Self Care | Payer: No Typology Code available for payment source | Source: Ambulatory Visit | Attending: Surgery | Admitting: Surgery

## 2016-02-23 ENCOUNTER — Ambulatory Visit: Payer: Self-pay | Admitting: Hematology

## 2016-02-23 DIAGNOSIS — K9189 Other postprocedural complications and disorders of digestive system: Secondary | ICD-10-CM

## 2016-02-25 LAB — CULTURE, BLOOD (SINGLE)

## 2016-02-26 ENCOUNTER — Ambulatory Visit (HOSPITAL_BASED_OUTPATIENT_CLINIC_OR_DEPARTMENT_OTHER): Payer: No Typology Code available for payment source

## 2016-02-26 ENCOUNTER — Other Ambulatory Visit: Payer: Self-pay | Admitting: Hematology

## 2016-02-26 ENCOUNTER — Ambulatory Visit: Payer: No Typology Code available for payment source

## 2016-02-26 ENCOUNTER — Other Ambulatory Visit (HOSPITAL_BASED_OUTPATIENT_CLINIC_OR_DEPARTMENT_OTHER): Payer: No Typology Code available for payment source

## 2016-02-26 ENCOUNTER — Other Ambulatory Visit: Payer: Self-pay | Admitting: *Deleted

## 2016-02-26 ENCOUNTER — Telehealth: Payer: Self-pay | Admitting: Hematology

## 2016-02-26 VITALS — BP 127/43 | HR 114 | Temp 99.6°F | Resp 18

## 2016-02-26 DIAGNOSIS — N390 Urinary tract infection, site not specified: Secondary | ICD-10-CM

## 2016-02-26 DIAGNOSIS — D638 Anemia in other chronic diseases classified elsewhere: Secondary | ICD-10-CM

## 2016-02-26 DIAGNOSIS — E860 Dehydration: Secondary | ICD-10-CM

## 2016-02-26 DIAGNOSIS — D509 Iron deficiency anemia, unspecified: Secondary | ICD-10-CM

## 2016-02-26 DIAGNOSIS — C20 Malignant neoplasm of rectum: Secondary | ICD-10-CM | POA: Diagnosis not present

## 2016-02-26 LAB — URINALYSIS, MICROSCOPIC - CHCC
Bilirubin (Urine): NEGATIVE
GLUCOSE UR CHCC: NEGATIVE mg/dL
KETONES: NEGATIVE mg/dL
Nitrite: NEGATIVE
Protein: 30 mg/dL
SPECIFIC GRAVITY, URINE: 1.01 (ref 1.003–1.035)
UROBILINOGEN UR: 0.2 mg/dL (ref 0.2–1)
pH: 6 (ref 4.6–8.0)

## 2016-02-26 LAB — COMPREHENSIVE METABOLIC PANEL
ALBUMIN: 3 g/dL — AB (ref 3.5–5.0)
ALK PHOS: 91 U/L (ref 40–150)
ALT: <9 U/L (ref 0–55)
ANION GAP: 13 meq/L — AB (ref 3–11)
AST: 9 U/L (ref 5–34)
BUN: 6.1 mg/dL — ABNORMAL LOW (ref 7.0–26.0)
CALCIUM: 10.7 mg/dL — AB (ref 8.4–10.4)
CHLORIDE: 99 meq/L (ref 98–109)
CO2: 26 mEq/L (ref 22–29)
Creatinine: 0.8 mg/dL (ref 0.6–1.1)
EGFR: 84 mL/min/{1.73_m2} — AB (ref >90)
Glucose: 156 mg/dl — ABNORMAL HIGH (ref 70–140)
POTASSIUM: 3.4 meq/L — AB (ref 3.5–5.1)
Sodium: 138 mEq/L (ref 136–145)
Total Bilirubin: 0.34 mg/dL (ref 0.20–1.20)
Total Protein: 8.7 g/dL — ABNORMAL HIGH (ref 6.4–8.3)

## 2016-02-26 LAB — CBC WITH DIFFERENTIAL/PLATELET
BASO%: 0.6 % (ref 0.0–2.0)
BASOS ABS: 0.1 10*3/uL (ref 0.0–0.1)
EOS ABS: 0.2 10*3/uL (ref 0.0–0.5)
EOS%: 1.1 % (ref 0.0–7.0)
HCT: 41.2 % (ref 34.8–46.6)
HGB: 13.3 g/dL (ref 11.6–15.9)
LYMPH#: 0.8 10*3/uL — AB (ref 0.9–3.3)
LYMPH%: 5.7 % — ABNORMAL LOW (ref 14.0–49.7)
MCH: 27.2 pg (ref 25.1–34.0)
MCHC: 32.2 g/dL (ref 31.5–36.0)
MCV: 84.7 fL (ref 79.5–101.0)
MONO#: 0.6 10*3/uL (ref 0.1–0.9)
MONO%: 3.8 % (ref 0.0–14.0)
NEUT#: 13.1 10*3/uL — ABNORMAL HIGH (ref 1.5–6.5)
NEUT%: 88.8 % — AB (ref 38.4–76.8)
PLATELETS: 637 10*3/uL — AB (ref 145–400)
RBC: 4.87 10*6/uL (ref 3.70–5.45)
RDW: 16.6 % — ABNORMAL HIGH (ref 11.2–14.5)
WBC: 14.8 10*3/uL — ABNORMAL HIGH (ref 3.9–10.3)

## 2016-02-26 LAB — MAGNESIUM: MAGNESIUM: 2.1 mg/dL (ref 1.5–2.5)

## 2016-02-26 LAB — CULTURE, BLOOD (SINGLE)

## 2016-02-26 MED ORDER — SODIUM CHLORIDE 0.9 % IJ SOLN
10.0000 mL | INTRAMUSCULAR | Status: DC | PRN
  Administered 2016-02-26: 10 mL
  Filled 2016-02-26: qty 10

## 2016-02-26 MED ORDER — HEPARIN SOD (PORK) LOCK FLUSH 100 UNIT/ML IV SOLN
250.0000 [IU] | Freq: Once | INTRAVENOUS | Status: AC | PRN
  Administered 2016-02-26: 250 [IU]
  Filled 2016-02-26: qty 5

## 2016-02-26 MED ORDER — CIPROFLOXACIN HCL 500 MG PO TABS: 500.0000 mg | ORAL_TABLET | Freq: Two times a day (BID) | ORAL | 0 refills | Status: DC

## 2016-02-26 NOTE — Patient Instructions (Signed)

## 2016-02-26 NOTE — Telephone Encounter (Signed)
Pt was here for IVF today, reports urinary frequency and tongue urgency, mild dysuria. She has had persistent low-grade fever, with temperature 99 to 101. She also has episodes of worsening watery diarrhea, and some rectal discharge. I discussed her case with Dr. gross, who reviewed her recent CT scan from last week, and does not feel she has recurrent abscess, more likely she has proctitis, he recommends cipro 531m bid for 2 weeks.   I spoke with pt, discussed the above and her scan findings, she voiced good understanding and agrees with the plan. I will call you in Cipro 2 from today. I encouraged her to drink more fluids when she has worsening diarrhea. If needed, I'll add on IV fluids for the coming Saturday. She is currently scheduled for IV fluids twice a week. I'll see her back next Thursday.  FTruitt Merle 02/26/2016

## 2016-02-27 ENCOUNTER — Telehealth: Payer: Self-pay | Admitting: *Deleted

## 2016-02-27 LAB — URINE CULTURE

## 2016-02-27 NOTE — Telephone Encounter (Signed)
Spoke with Lattie Haw, pharmacist @ [facility] wanting to inform Dr. Burr Medico of possible drug interactions between Cipro and Zofran - can cause severe QT prolongation;  Cipro and Phenergan - can cause moderate QT prolongation.  No interaction noted with Cipro and Compazine.  Dr. Burr Medico notified.  Pt to take Cipro as prescribed. Spoke with pt and was informed that pt has Zofran, Compazine, and Phenergan meds available at home.  Informed pt of above possible drug interactions.   Pt voiced understanding but stated " I have taken this Cipro before , and have no problems with any of my medications ".   Pt informed nurse that she has enough Compazine med at home, and would not need refills. Lattie Haw, pharmacist's  Phone          [phone].

## 2016-02-28 ENCOUNTER — Encounter: Payer: Self-pay | Admitting: Hematology

## 2016-02-28 ENCOUNTER — Other Ambulatory Visit: Payer: Self-pay | Admitting: Pharmacist

## 2016-02-28 NOTE — Progress Notes (Signed)
Sent [employer] prior auth req for hydrocodone-acetaminophen-via covermymeds

## 2016-02-28 NOTE — Progress Notes (Signed)
Sent [employer] prior auth req for hydrocodone-acetaminophen-via covermymeds- per [employer] no Josem Kaufmann is needed it is on list of covered drugs per her plan. [id number] if questions

## 2016-02-28 NOTE — Progress Notes (Signed)
Faxed to [id number] [phone]

## 2016-02-29 ENCOUNTER — Ambulatory Visit (HOSPITAL_BASED_OUTPATIENT_CLINIC_OR_DEPARTMENT_OTHER): Payer: No Typology Code available for payment source

## 2016-02-29 ENCOUNTER — Other Ambulatory Visit: Payer: No Typology Code available for payment source

## 2016-02-29 VITALS — BP 119/72 | HR 92 | Temp 98.1°F | Resp 20

## 2016-02-29 DIAGNOSIS — C20 Malignant neoplasm of rectum: Secondary | ICD-10-CM

## 2016-02-29 DIAGNOSIS — R197 Diarrhea, unspecified: Secondary | ICD-10-CM

## 2016-02-29 MED ORDER — SODIUM CHLORIDE 0.9 % IV SOLN
INTRAVENOUS | Status: DC
  Administered 2016-02-29: 15:00:00 via INTRAVENOUS

## 2016-02-29 MED ORDER — HEPARIN SOD (PORK) LOCK FLUSH 100 UNIT/ML IV SOLN
500.0000 [IU] | Freq: Once | INTRAVENOUS | Status: AC
  Administered 2016-02-29: 500 [IU] via INTRAVENOUS
  Filled 2016-02-29: qty 5

## 2016-02-29 MED ORDER — SODIUM CHLORIDE 0.9% FLUSH
10.0000 mL | INTRAVENOUS | Status: DC | PRN
  Administered 2016-02-29: 10 mL via INTRAVENOUS
  Filled 2016-02-29: qty 10

## 2016-02-29 NOTE — Patient Instructions (Signed)
Dehydration, Adult Dehydration means your body does not have as much fluid or water as it needs. It happens when you take in less fluid than you lose. Your kidneys, brain, and heart will not work properly without the right amount of fluids.  Dehydration can range from mild to severe. It should be treated right away to help prevent it from becoming severe. HOME CARE  Drink enough fluid to keep your pee (urine) clear or pale yellow.  Drink water or fluid slowly by taking small sips. You can also try sucking on ice cubes.  Have food or drinks that contain electrolytes. Examples include bananas and sports drinks.  Take over-the-counter and prescription medicines only as told by your doctor.  Prepare oral rehydration solution (ORS) according to the instructions that came with it. Take sips of ORS every 5 minutes until your pee returns to normal.  If you are throwing up (vomiting) or have watery poop (diarrhea), keep trying to drink water, ORS, or both.  If you have watery poop, avoid:  Drinks with caffeine.  Fruit juice.  Milk.  Carbonated soft drinks.  Do not take salt tablets. This can lead to having too much sodium in your body (hypernatremia). GET HELP IF:  You cannot eat or drink without throwing up.  You have had mild watery poop for longer than 24 hours.  You have a fever. GET HELP RIGHT AWAY IF:   You have very strong thirst.  You have very bad watery poop.  You have not peed in 6-8 hours, or you have peed only a small amount of very dark pee.  You have shriveled skin.  You are dizzy, confused, or both.   This information is not intended to replace advice given to you by your health care provider. Make sure you discuss any questions you have with your health care provider.   Document Released: 04/27/2009 Document Revised: 03/22/2015 Document Reviewed: 11/16/2014 Elsevier Interactive Patient Education [employer].

## 2016-03-04 ENCOUNTER — Ambulatory Visit (HOSPITAL_BASED_OUTPATIENT_CLINIC_OR_DEPARTMENT_OTHER): Payer: No Typology Code available for payment source

## 2016-03-04 ENCOUNTER — Other Ambulatory Visit (HOSPITAL_BASED_OUTPATIENT_CLINIC_OR_DEPARTMENT_OTHER): Payer: No Typology Code available for payment source

## 2016-03-04 DIAGNOSIS — E860 Dehydration: Secondary | ICD-10-CM

## 2016-03-04 DIAGNOSIS — C20 Malignant neoplasm of rectum: Secondary | ICD-10-CM

## 2016-03-04 DIAGNOSIS — R197 Diarrhea, unspecified: Secondary | ICD-10-CM

## 2016-03-04 LAB — COMPREHENSIVE METABOLIC PANEL
ALT: <9 U/L (ref 0–55)
ANION GAP: 12 meq/L — AB (ref 3–11)
AST: 8 U/L (ref 5–34)
Albumin: 2.7 g/dL — ABNORMAL LOW (ref 3.5–5.0)
Alkaline Phosphatase: 88 U/L (ref 40–150)
BUN: 5.5 mg/dL — ABNORMAL LOW (ref 7.0–26.0)
CALCIUM: 10.5 mg/dL — AB (ref 8.4–10.4)
CHLORIDE: 106 meq/L (ref 98–109)
CO2: 25 mEq/L (ref 22–29)
Creatinine: 0.8 mg/dL (ref 0.6–1.1)
Glucose: 139 mg/dl (ref 70–140)
POTASSIUM: 3.6 meq/L (ref 3.5–5.1)
Sodium: 143 mEq/L (ref 136–145)
Total Bilirubin: <0.3 mg/dL (ref 0.20–1.20)
Total Protein: 8.1 g/dL (ref 6.4–8.3)

## 2016-03-04 LAB — CBC WITH DIFFERENTIAL/PLATELET
BASO%: 0.9 % (ref 0.0–2.0)
BASOS ABS: 0.1 10*3/uL (ref 0.0–0.1)
EOS ABS: 0.1 10*3/uL (ref 0.0–0.5)
EOS%: 1.3 % (ref 0.0–7.0)
HCT: 39.1 % (ref 34.8–46.6)
HGB: 12.4 g/dL (ref 11.6–15.9)
LYMPH%: 8.6 % — AB (ref 14.0–49.7)
MCH: 26.7 pg (ref 25.1–34.0)
MCHC: 31.7 g/dL (ref 31.5–36.0)
MCV: 84.3 fL (ref 79.5–101.0)
MONO#: 0.6 10*3/uL (ref 0.1–0.9)
MONO%: 5.8 % (ref 0.0–14.0)
NEUT#: 9.3 10*3/uL — ABNORMAL HIGH (ref 1.5–6.5)
NEUT%: 83.4 % — AB (ref 38.4–76.8)
PLATELETS: 496 10*3/uL — AB (ref 145–400)
RBC: 4.63 10*6/uL (ref 3.70–5.45)
RDW: 16.5 % — ABNORMAL HIGH (ref 11.2–14.5)
WBC: 11.2 10*3/uL — ABNORMAL HIGH (ref 3.9–10.3)
lymph#: 1 10*3/uL (ref 0.9–3.3)

## 2016-03-04 LAB — MAGNESIUM: Magnesium: 2.2 mg/dl (ref 1.5–2.5)

## 2016-03-04 MED ORDER — SODIUM CHLORIDE 0.9% FLUSH
10.0000 mL | INTRAVENOUS | Status: DC | PRN
  Administered 2016-03-04: 10 mL via INTRAVENOUS
  Filled 2016-03-04: qty 10

## 2016-03-04 MED ORDER — HEPARIN SOD (PORK) LOCK FLUSH 100 UNIT/ML IV SOLN
500.0000 [IU] | Freq: Once | INTRAVENOUS | Status: AC
  Administered 2016-03-04: 500 [IU] via INTRAVENOUS
  Filled 2016-03-04: qty 5

## 2016-03-04 MED ORDER — SODIUM CHLORIDE 0.9 % IV SOLN
INTRAVENOUS | Status: AC
  Administered 2016-03-04: 15:00:00 via INTRAVENOUS

## 2016-03-04 NOTE — Progress Notes (Deleted)
Pt went back to infusion room to get labs and accessed

## 2016-03-04 NOTE — Telephone Encounter (Signed)
err

## 2016-03-06 ENCOUNTER — Encounter: Payer: Self-pay | Admitting: Skilled Nursing Facility1

## 2016-03-06 NOTE — Progress Notes (Signed)
To assist the pt in identifying dietary strategies to gain lost wt.    Pt identified as being malnourished due to lost wt. Pt contacted via the telephone at [phone]. Pt states she has lost about 120 pounds over the last year. Pt states she has an okay appetite and is taking it one day at a time. Pt states she usually gets two decent meals a day and also drinks dole frozen yogurt drink. Pt states she is not drinking any ensure or boost. Pt states she has gained 5 pounds recently.  Dietitian advised the pt to start drinking ensure or boost on the days she has less of an appetite than other days.

## 2016-03-07 ENCOUNTER — Other Ambulatory Visit: Payer: Self-pay | Admitting: Hematology

## 2016-03-07 ENCOUNTER — Encounter: Payer: Self-pay | Admitting: *Deleted

## 2016-03-07 ENCOUNTER — Encounter: Payer: Self-pay | Admitting: Hematology

## 2016-03-07 ENCOUNTER — Ambulatory Visit (HOSPITAL_BASED_OUTPATIENT_CLINIC_OR_DEPARTMENT_OTHER): Payer: No Typology Code available for payment source

## 2016-03-07 ENCOUNTER — Other Ambulatory Visit: Payer: No Typology Code available for payment source

## 2016-03-07 DIAGNOSIS — D638 Anemia in other chronic diseases classified elsewhere: Secondary | ICD-10-CM

## 2016-03-07 DIAGNOSIS — C20 Malignant neoplasm of rectum: Secondary | ICD-10-CM | POA: Diagnosis not present

## 2016-03-07 MED ORDER — LACTATED RINGERS IV SOLN
Freq: Once | INTRAVENOUS | Status: DC
  Filled 2016-03-07: qty 250

## 2016-03-07 MED ORDER — SODIUM CHLORIDE 0.9 % IJ SOLN
10.0000 mL | INTRAMUSCULAR | Status: DC | PRN
  Administered 2016-03-07: 10 mL
  Filled 2016-03-07: qty 10

## 2016-03-07 MED ORDER — HEPARIN SOD (PORK) LOCK FLUSH 100 UNIT/ML IV SOLN
500.0000 [IU] | Freq: Once | INTRAVENOUS | Status: AC
  Administered 2016-03-07: 500 [IU] via INTRAVENOUS
  Filled 2016-03-07: qty 5

## 2016-03-07 MED ORDER — HEPARIN SOD (PORK) LOCK FLUSH 100 UNIT/ML IV SOLN
500.0000 [IU] | Freq: Once | INTRAVENOUS | Status: DC | PRN
  Filled 2016-03-07: qty 5

## 2016-03-07 MED ORDER — SODIUM CHLORIDE 0.9 % IV SOLN
Freq: Once | INTRAVENOUS | Status: AC
  Administered 2016-03-07: 14:00:00 via INTRAVENOUS

## 2016-03-07 NOTE — Patient Instructions (Signed)

## 2016-03-07 NOTE — Progress Notes (Signed)
Pt requested to come in on 03/09/16 for IVF. RN spoke with Dr Ernestina Penna nurse, Thu, and orders were placed. Pt will set up appt time.

## 2016-03-07 NOTE — Progress Notes (Signed)
Recd med[employer] assessment form in my box

## 2016-03-08 ENCOUNTER — Other Ambulatory Visit: Payer: Self-pay | Admitting: *Deleted

## 2016-03-09 ENCOUNTER — Ambulatory Visit (HOSPITAL_BASED_OUTPATIENT_CLINIC_OR_DEPARTMENT_OTHER): Payer: No Typology Code available for payment source

## 2016-03-09 VITALS — BP 143/88 | HR 79 | Temp 98.4°F | Resp 16

## 2016-03-09 DIAGNOSIS — D638 Anemia in other chronic diseases classified elsewhere: Secondary | ICD-10-CM

## 2016-03-09 DIAGNOSIS — E860 Dehydration: Secondary | ICD-10-CM

## 2016-03-09 MED ORDER — SODIUM CHLORIDE 0.9 % IJ SOLN
10.0000 mL | INTRAMUSCULAR | Status: DC | PRN
  Administered 2016-03-09: 10 mL
  Filled 2016-03-09: qty 10

## 2016-03-09 MED ORDER — SODIUM CHLORIDE 0.9 % IV SOLN
Freq: Once | INTRAVENOUS | Status: AC
  Administered 2016-03-09: 09:00:00 via INTRAVENOUS

## 2016-03-09 MED ORDER — HEPARIN SOD (PORK) LOCK FLUSH 100 UNIT/ML IV SOLN
500.0000 [IU] | Freq: Once | INTRAVENOUS | Status: AC | PRN
  Administered 2016-03-09: 500 [IU]
  Filled 2016-03-09: qty 5

## 2016-03-09 NOTE — Patient Instructions (Signed)

## 2016-03-11 ENCOUNTER — Telehealth: Payer: Self-pay | Admitting: Hematology

## 2016-03-11 ENCOUNTER — Encounter: Payer: Self-pay | Admitting: Hematology

## 2016-03-11 ENCOUNTER — Ambulatory Visit (HOSPITAL_BASED_OUTPATIENT_CLINIC_OR_DEPARTMENT_OTHER): Payer: No Typology Code available for payment source

## 2016-03-11 ENCOUNTER — Other Ambulatory Visit (HOSPITAL_BASED_OUTPATIENT_CLINIC_OR_DEPARTMENT_OTHER): Payer: No Typology Code available for payment source

## 2016-03-11 ENCOUNTER — Ambulatory Visit (HOSPITAL_BASED_OUTPATIENT_CLINIC_OR_DEPARTMENT_OTHER): Payer: No Typology Code available for payment source | Admitting: Hematology

## 2016-03-11 VITALS — BP 146/86 | HR 91 | Temp 98.6°F | Resp 18 | Ht 70.0 in | Wt 269.4 lb

## 2016-03-11 VITALS — BP 127/92 | HR 78

## 2016-03-11 DIAGNOSIS — F329 Major depressive disorder, single episode, unspecified: Secondary | ICD-10-CM

## 2016-03-11 DIAGNOSIS — I10 Essential (primary) hypertension: Secondary | ICD-10-CM

## 2016-03-11 DIAGNOSIS — C20 Malignant neoplasm of rectum: Secondary | ICD-10-CM

## 2016-03-11 DIAGNOSIS — K922 Gastrointestinal hemorrhage, unspecified: Secondary | ICD-10-CM | POA: Diagnosis not present

## 2016-03-11 DIAGNOSIS — D500 Iron deficiency anemia secondary to blood loss (chronic): Secondary | ICD-10-CM

## 2016-03-11 DIAGNOSIS — E860 Dehydration: Secondary | ICD-10-CM

## 2016-03-11 DIAGNOSIS — D638 Anemia in other chronic diseases classified elsewhere: Secondary | ICD-10-CM

## 2016-03-11 DIAGNOSIS — R630 Anorexia: Secondary | ICD-10-CM

## 2016-03-11 LAB — COMPREHENSIVE METABOLIC PANEL
ALBUMIN: 2.6 g/dL — AB (ref 3.5–5.0)
ALK PHOS: 85 U/L (ref 40–150)
ALT: <9 U/L (ref 0–55)
ANION GAP: 13 meq/L — AB (ref 3–11)
AST: 9 U/L (ref 5–34)
BUN: 7.6 mg/dL (ref 7.0–26.0)
CALCIUM: 10.2 mg/dL (ref 8.4–10.4)
CO2: 25 mEq/L (ref 22–29)
Chloride: 105 mEq/L (ref 98–109)
Creatinine: 0.9 mg/dL (ref 0.6–1.1)
EGFR: 79 mL/min/{1.73_m2} — AB (ref >90)
GLUCOSE: 142 mg/dL — AB (ref 70–140)
POTASSIUM: 3.8 meq/L (ref 3.5–5.1)
SODIUM: 142 meq/L (ref 136–145)
Total Bilirubin: 0.36 mg/dL (ref 0.20–1.20)
Total Protein: 7.9 g/dL (ref 6.4–8.3)

## 2016-03-11 LAB — CBC WITH DIFFERENTIAL/PLATELET
BASO%: 0.2 % (ref 0.0–2.0)
BASOS ABS: 0 10*3/uL (ref 0.0–0.1)
EOS%: 1.4 % (ref 0.0–7.0)
Eosinophils Absolute: 0.2 10*3/uL (ref 0.0–0.5)
HEMATOCRIT: 34.9 % (ref 34.8–46.6)
HGB: 11 g/dL — ABNORMAL LOW (ref 11.6–15.9)
LYMPH#: 0.8 10*3/uL — AB (ref 0.9–3.3)
LYMPH%: 7.2 % — ABNORMAL LOW (ref 14.0–49.7)
MCH: 27 pg (ref 25.1–34.0)
MCHC: 31.5 g/dL (ref 31.5–36.0)
MCV: 85.7 fL (ref 79.5–101.0)
MONO#: 0.6 10*3/uL (ref 0.1–0.9)
MONO%: 5.2 % (ref 0.0–14.0)
NEUT#: 9.5 10*3/uL — ABNORMAL HIGH (ref 1.5–6.5)
NEUT%: 86 % — AB (ref 38.4–76.8)
Platelets: 393 10*3/uL (ref 145–400)
RBC: 4.07 10*6/uL (ref 3.70–5.45)
RDW: 15.4 % — ABNORMAL HIGH (ref 11.2–14.5)
WBC: 11 10*3/uL — ABNORMAL HIGH (ref 3.9–10.3)

## 2016-03-11 LAB — MAGNESIUM: Magnesium: 1.9 mg/dl (ref 1.5–2.5)

## 2016-03-11 MED ORDER — HEPARIN SOD (PORK) LOCK FLUSH 100 UNIT/ML IV SOLN
500.0000 [IU] | Freq: Once | INTRAVENOUS | Status: AC | PRN
  Administered 2016-03-11: 500 [IU]
  Filled 2016-03-11: qty 5

## 2016-03-11 MED ORDER — SODIUM CHLORIDE 0.9 % IJ SOLN
10.0000 mL | INTRAMUSCULAR | Status: DC | PRN
  Administered 2016-03-11: 10 mL
  Filled 2016-03-11: qty 10

## 2016-03-11 MED ORDER — HYDROCODONE-ACETAMINOPHEN 5-325 MG PO TABS: 1.0000 | ORAL_TABLET | Freq: Four times a day (QID) | ORAL | 0 refills | Status: DC | PRN

## 2016-03-11 MED ORDER — SODIUM CHLORIDE 0.9 % IV SOLN
Freq: Once | INTRAVENOUS | Status: AC
Start: 2016-03-11 — End: 2016-03-11
  Administered 2016-03-11: 15:00:00 via INTRAVENOUS

## 2016-03-11 NOTE — Progress Notes (Signed)
[name]  [phone] [phone]  Clinic Follow Up Note   Patient Care Team: [name], MD as PCP - General (Family Medicine) Michael Boston, MD as Consulting Physician (General Surgery) Truitt Merle, MD as Consulting Physician (Medical Oncology) Kyung Rudd, MD as Consulting Physician (Radiation Oncology) Teena Irani, MD as Consulting Physician (Gastroenterology) Raynelle Bring, MD as Consulting Physician (Urology) 03/11/2016  CHIEF COMPLAINTS:  Follow up rectal cancer   Oncology History   Rectal adenocarcinoma   Staging form: Colon and Rectum, AJCC 7th Edition     Clinical: T3, N2, M0 - Unsigned       Rectal adenocarcinoma s/p LAR resection 05/25/2015   01/13/2015 Initial Diagnosis    Rectal adenocarcinoma      01/13/2015 Procedure    Colonoscopy showed a sensitivity O nonobstructing mass in the rectum and from 12-18 cm proximal to the Annis. The mass was circumferential, measuring about 6 cm in length. EGD was negative.      01/17/2015 Tumor Marker    CEA 1.4, CA-19-9 8, MMR normal       01/18/2015 Procedure    Lower endoscopic ultrasound by Dr. Paulita Fujita showed a T3 N2 rectal mass.      01/20/2015 Imaging    CT abdomen and pelvis with contrast showed right lateral rectal wall exophytic mass, and 2 ill-defined hepatic hypoenhancing lesions which appears to correspond to the lesion seen on the prior MRI in 2015.      02/05/2015 Imaging    abdomen MRI showed 2 hemangioma, no suspicion for metastatic disease. CT chest was negative      02/20/2015 - 03/30/2015 Radiation Therapy    neoadjuvant RT to rectal cancer       02/20/2015 Concurrent Chemotherapy    capecitabine 2500 mg in the morning and 2000 mg in the evening (854m/m2, bid), on the day of radiation.       05/25/2015 Surgery    Recto-sigmoid segmental resection, margins are negative       05/25/2015 Pathology Results    0.2cm residual invasive adenocarcinoma, G2, negative margins,  12 nodes were negative       06/23/2015 - 06/29/2015 Hospital Admission    Patient was admitted for pelvic abscess, drain placed and she was treated with IV antibiotics, she also received 1 dose of Feraheme for anemia.      09/15/2015 - 09/17/2015 Hospital Admission    Recurrent pelvic collection s/p perc drainage 09/16/2015       HISTORY OF PRESENTING ILLNESS:  Lindsay GUNNELLS50y.o. female is here because of evaluation for management for newly diagnosed rectal adenocarcinoma.   On 09/06/13, she presented to Dr. HStarr SinclairGI] with 439-monthistory of diarrhea and worsening abdominal pain. She has a history of chronic abdominal pain and had been previously diagnosed [place] by Dr. HaAmedeo PlentyAbdominal [place]/6/15 noted to have some hypoechoicity in the left hepatic lobe which was confirmed on MRI to be two hemangiomas  in the right hepatic lobe of measuring up to 3.0 cm along with an additional 7 mm probable cyst.   She presented with worsening of diarrhea for 6 month and was reevaluated by Dr. HaAmedeo PlentyShe was seen on 12/06/14 at which time it was decided she would undergo HIDA scan given that the pain was intermittently in the RUQ in addition to upper and lower endoscopy. HIDA scan 12/15/14 was reassuring, but she  was found to have a malignant tumor of the rectum about 12-18 cm proximal to the anus, gastritis, gastric polyps on endoscopies 01/13/15. Biopsies for these three specimens along with the duodenum were collected and resulted on 01/17/15 for invasive adenocarcinoma, chronic gastritis [H. Pylori negative], fundic gland polyps, and non-specific mild patchy intra-epithelial lymphocytes. Endoscopic [place] 01/18/15 staged the mass at T3N2Mx and she is here today for consideration of neoadjuvant therapy.    Today, she does report abdominal pain but is no different than her baseline [right-sided, upper, lower] but has had worsening  appetite over the last 2 months. She does have diarrhea with bowel movements every 1-2 hours, especially after eating, and has been noticing bleeding with her bowel movement following her endoscopic evaluation. She also feels her energy level has decreased. Otherwise, she denies nausea, chest pain, shortness of breath.    Family history is notable for a maternal uncle who had pancreatic cancer diagnosed in his mid to late 39s that spread to the colon and prostate before he died about 23 years later and a sister with non-melanoma skin cancer. 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 She smoked roughly 20 pack years [1 pack/week x 7 years] in the past and reports occasional alcohol use but denies any prior illicit drug use.  She lost about 7 lbs in the past 2 months  CURRENT THERAPY: supportive care, IVF 2-3 times a week    INTERIM HISTORY Lindsay Little returns for follow-up. Her fever has resolved after the course of cipro, she has missed a few doses of cipro due to nausea, and will finish in a few days. Her diarrhea and rectal discharge overall has improved also, but she still not feeling well sometime, with nausea, diarrhea, low appetite, she is very frustrated.    HISTORY:  Past Medical History:  Diagnosis Date  . Anemia   . Anxiety   . Bulging disc    L5  . Cancer ([place])   . Complication of anesthesia   . 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 Depression   . Fibroids   . GERD (gastroesophageal reflux disease)   . Heart murmur   . History of blood transfusion   . History of chemotherapy   . History of frequent ear infections   . History of frequent urinary tract infections    middle school age  . History of hiatal hernia   . History of mitral valve prolapse   . Hypercholesteremia   . Hypertension   . IBS (irritable bowel syndrome)   . Mild sleep apnea    does not use CPAP  . Numbness and tingling    great  toe bilat left more than right   . Obesity   . Occasional tremors   . Osteoarthritis, knee   . PONV (postoperative nausea and vomiting)   . Rectal bleeding   . S/P radiation therapy 02/20/15-03/30/15   colon/rectal  . Tear of left rotator cuff   . TMJ (dislocation of temporomandibular joint)   . Vertigo   . Vitamin D deficiency   . Wears glasses     SURGICAL HISTORY: Past Surgical History:  Procedure Laterality Date  . ABDOMINAL HYSTERECTOMY  1996   Removed uterus, and tubes  . DILATION AND CURETTAGE OF UTERUS     times 2  . EUS N/A 01/18/2015   Procedure: LOWER ENDOSCOPIC ULTRASOUND (EUS);  Surgeon: Arta Silence, MD;  Location: [facility];  Service: Endoscopy;  Laterality: N/A;  . EXCISION OF SKIN TAG  11/17/2015   Procedure: EXCISION OF SKIN TAG;  Surgeon: Michael Boston, MD;  Location: [facility];  Service: General;;  . ILEO LOOP COLOSTOMY CLOSURE N/A 11/17/2015   Procedure: LAPAROSCOPIC DIVERTING LOOP ILEOSTOMY  DRAINAGE OF PELVIC ABSCESS;  Surgeon: Michael Boston, MD;  Location: [facility];  Service: General;  Laterality: N/A;  . IMPACTION REMOVAL  11/17/2015   Procedure: DISIMPACTION REMOVAL;  Surgeon: Michael Boston, MD;  Location: [facility];  Service: General;;  . JP drain     . LAPAROSCOPIC LYSIS OF ADHESIONS  11/17/2015   Procedure: LAPAROSCOPIC LYSIS OF ADHESIONS;  Surgeon: Michael Boston, MD;  Location: [facility];  Service: General;;  . left knee arthroscopy     mult  . PORTACATH PLACEMENT N/A 05/25/2015   Procedure: INSERTION PORT-A-CATH;  Surgeon: Michael Boston, MD;  Location: [facility];  Service: General;  Laterality: N/A;  . ROTATOR CUFF REPAIR    . TONSILLECTOMY    . XI ROBOTIC ASSISTED LOWER ANTERIOR RESECTION N/A 05/25/2015   Procedure: XI ROBOTIC ASSISTED LOWER ANTERIOR RESECTION, , RIGID PROCTOSCOPY, RIGHT OOPHORECTOMY;  Surgeon: Michael Boston, MD;  Location: [facility];  Service: General;  Laterality: N/A;    SOCIAL HISTORY: Social History   Social History  . Marital status: Married     Spouse name: N/A  . Number of children: N/A  . Years of education: N/A   Occupational History  . Not on file.   Social History Main Topics  . Smoking status: Former Smoker    Packs/day: 0.50    Years: 7.00    Types: Cigarettes    Quit date: 07/16/1991  . Smokeless tobacco: Never Used     Comment: 1 pack/week intermittently x 7 years  . Alcohol use 0.6 oz/week    1 Shots of liquor per week     Comment: rarely   . Drug use: No  . Sexual activity: Not on file   Other Topics Concern  . Not on file   Social History Narrative   Tobacco Use: Cigarettes - Former Smoker   Alcohol: Yes, very rare, liquor.    No recreational drug use   Occupation: Head CMA @ [employer]   Marital Status: Married    Husband: Roselyn Reef Disabled   Children: 2 adopted kids [place]   Religion: [employer] [place] HISTORY: Family History  Problem Relation Age of Onset  . Coronary artery disease Mother 53  . Hypertension Mother   . Hyperlipidemia Mother   . Diabetes Mellitus I Mother   . Coronary artery disease Father   . Hyperlipidemia Father   . Hypertension Father   . Cancer Sister     skin - non melanoma  . Hyperlipidemia Brother   . Cancer Maternal Uncle 70    pancreatic with mets to colon and prostate  . Cirrhosis Maternal Uncle   . Hypertension Maternal Grandmother   . Diabetes Mellitus I Maternal Grandmother   . Hyperlipidemia Maternal Grandmother   . CVA Maternal Grandmother   . Hypertension Maternal Grandfather   . Coronary artery disease Maternal Grandfather 65  . Hyperlipidemia Maternal Grandfather   . Coronary artery disease Paternal Grandmother   . Hypertension Paternal Grandmother   . Hyperlipidemia Paternal Grandmother   . Diabetes Mellitus I Paternal Grandmother   . Hypertension  Paternal Grandfather   . Hyperlipidemia Paternal Grandfather   . Coronary artery disease Paternal Grandfather     ALLERGIES:  is allergic to caine-1  [lidocaine]; flagyl [metronidazole]; adhesive [tape]; iron; oxycodone; penicillins; and sulfa antibiotics.  MEDICATIONS:  Current Outpatient Prescriptions  Medication Sig Dispense Refill  . BYSTOLIC 10 MG tablet Take 10 mg by mouth daily. Reported on 01/22/2016  0  . ciprofloxacin (CIPRO) 500 MG tablet Take 1 tablet (500 mg total) by mouth 2 (two) times daily. 28 tablet 0  . Coenzyme Q10 (COQ-10) 100 MG CAPS Take 100 mg by mouth daily.    . diphenoxylate-atropine (LOMOTIL) 2.5-0.025 MG tablet Take 1 tablet by mouth 4 (four) times daily as needed for diarrhea or loose stools. 30 tablet 1  . DULoxetine (CYMBALTA) 60 MG capsule Take 60 mg by mouth at bedtime.     Marland Kitchen esomeprazole (NEXIUM) 40 MG capsule Take 1 capsule (40 mg total) by mouth 2 (two) times daily before a meal. 60 capsule 5  . HYDROcodone-acetaminophen (NORCO/VICODIN) 5-325 MG tablet Take 1-2 tablets by mouth every 6 (six) hours as needed for moderate pain or severe pain. 60 tablet 0  . hyoscyamine (LEVBID) 0.375 MG 12 hr tablet Take 0.375 mg by mouth daily.     Marland Kitchen lidocaine-prilocaine (EMLA) cream Apply to affected area once (Patient taking differently: Apply 1 application topically daily as needed (For port-a-cath.). ) 30 g 3  . meloxicam (MOBIC) 15 MG tablet Take 15 mg by mouth daily as needed for pain.     . methocarbamol (ROBAXIN) 500 MG tablet Take 2 tablets (1,000 mg total) by mouth every 6 (six) hours as needed for muscle spasms. (Patient taking differently: Take 500-1,000 mg by mouth every 8 (eight) hours as needed for muscle spasms. ) 20 tablet 3  . ondansetron (ZOFRAN) 8 MG tablet Take 1 tablet (8 mg total) by mouth 2 (two) times daily. Start the day after chemo for 2 days. Then take as needed for nausea or vomiting. (Patient taking differently: Take 8 mg by mouth 2 (two) times daily as needed for nausea or vomiting. Start the day after chemo for 2 days. Then take as needed for nausea or vomiting.) 10 tablet 3  . potassium chloride  SA (K-DUR,KLOR-CON) 20 MEQ tablet Take 1 tablet (20 mEq total) by mouth 2 (two) times daily. 6 tablet 0  . promethazine (PHENERGAN) 25 MG tablet Take 25 mg by mouth every 8 (eight) hours as needed for nausea or vomiting.     . psyllium (METAMUCIL) 58.6 % powder Take 1 packet by mouth 2 (two) times daily as needed.     Marland Kitchen rOPINIRole (REQUIP) 0.25 MG tablet TAKE ONE TABLET BY MOUTH 1-3 hours BEFORE bedtime  5  . temazepam (RESTORIL) 15 MG capsule Take 15 mg by mouth at bedtime as needed for sleep (one to three hours before bed).    . traMADol (ULTRAM) 50 MG tablet Take 50 mg by mouth 2 (two) times daily as needed for moderate pain. pain  2  . Vitamin D, Ergocalciferol, (DRISDOL) 50000 UNITS CAPS capsule Take 50,000 Units by mouth 2 (two) times a week.     No current facility-administered medications for this visit.     REVIEW OF SYSTEMS:   Constitutional: Denies fevers, chills or abnormal night sweats Eyes: Denies blurriness of vision, double vision or watery eyes Ears, nose, mouth, throat, and face: Denies mucositis or sore throat Respiratory: Denies cough, dyspnea or wheezes Cardiovascular: Denies palpitation, chest discomfort or  lower extremity swelling Gastrointestinal:(+) abdominal pain  Skin: Denies abnormal skin rashes Lymphatics: Denies new lymphadenopathy or easy bruising Neurological:Denies numbness, tingling or new weaknesses Behavioral/Psych: Mood is stable, no new changes  All other systems were reviewed with the patient and are negative.  PHYSICAL EXAMINATION: ECOG PERFORMANCE STATUS: 2-3  Vitals:   03/11/16 1321  BP: (!) 146/86  Pulse: 91  Resp: 18  Temp: 98.6 F (37 C)   Filed Weights   03/11/16 1321  Weight: 269 lb 6.4 oz (122.2 kg)    GENERAL:alert, no distress and comfortable SKIN: skin color, texture, turgor are normal, no rashes or significant lesions EYES: normal, conjunctiva are pink and non-injected, sclera clear OROPHARYNX:no exudate, no erythema and  lips, buccal mucosa, and tongue normal  NECK: supple, thyroid normal size, non-tender, without nodularity LYMPH:  no palpable lymphadenopathy in the cervical, axillary or inguinal LUNGS: clear to auscultation and percussion with normal breathing effort HEART: regular rate & rhythm and no murmurs and no lower extremity edema ABDOMEN:abdomen soft, normal bowel sounds. No organomegaly,  Multiple surgical incision scar are well healed. (+) Ileostomy back in the right side abdomen  Musculoskeletal:no cyanosis of digits and no clubbing  PSYCH: alert & oriented x 3 with fluent speech NEURO: no focal motor/sensory deficits   LABORATORY DATA:  I have reviewed the data as listed CBC Latest Ref Rng & Units 03/11/2016 03/04/2016 02/26/2016  WBC 3.9 - 10.3 10e3/uL 11.0(H) 11.2(H) 14.8(H)  Hemoglobin 11.6 - 15.9 g/dL 11.0(L) 12.4 13.3  Hematocrit 34.8 - 46.6 % 34.9 39.1 41.2  Platelets 145 - 400 10e3/uL 393 496(H) 637(H)    CMP Latest Ref Rng & Units 03/11/2016 03/04/2016 02/26/2016  Glucose 70 - 140 mg/dl 142(H) 139 156(H)  BUN 7.0 - 26.0 mg/dL 7.6 5.5(L) 6.1(L)  Creatinine 0.6 - 1.1 mg/dL 0.9 0.8 0.8  Sodium 136 - 145 mEq/L 142 143 138  Potassium 3.5 - 5.1 mEq/L 3.8 3.6 3.4(L)  Chloride 101 - 111 mmol/L - - -  CO2 22 - 29 mEq/L _0 Calcium 8.4 - 10.4 mg/dL 10.2 10.5(H) 10.7(H)  Total Protein 6.4 - 8.3 g/dL 7.9 8.1 8.7(H)  Total Bilirubin 0.20 - 1.20 mg/dL 0.36 <0.30 0.34  Alkaline Phos 40 - 150 U/L 85 88 91  AST 5 - 34 U/L _1 ALT 0 - 55 U/L <9 <9 <9   CEA  Status: Finalresult Visible to patient:  Not Released Nextappt: Today at 09:45 AM in Oncology Burr Medico, Krista Blue, MD) Dx:  Rectal adenocarcinoma [facility])           Ref Range 3d ago  73moago     CEA 0.0 - 4.7 ng/mL 1.9 1.5CM         PATHOLOGY REPORT Diagnosis 05/25/2015 1. Colon, segmental resection for tumor, recto-sigmoid INVASIVE COLONIC ADENOCARCINOMA (0.2 CM IN GREATEST DIMENSION RESIDUAL TUMOR) THE TUMOR  INVADE SUBMUCOSA (0.3 CM IN DEPTH, PT1) MARGINS OF RESECTION ARE NEGATIVE FOR TUMOR TWELVE BENIGN LYMPH NODES (0/12) 2. Colon, resection margin (donut), proximal anastomotic ring BENIGN COLONIC TISSUE, NEGATIVE FOR MALIGANCY 3. Colon, resection margin (donut), distal anastomotic ring BENIGN COLONIC TISSUE, NEGATIVE FOR MALIGNANCY 4. Ovary, right BENIGN SEROUS CYSTADENOMA.  Microscopic Comment 1. COLON AND RECTUM (INCLUDING TRANS-ANAL RESECTION): Specimen: Colon-rectum Procedure: Segmental resection Tumor site: Rectum anterior wall mid to distal third of rectum Specimen integrity: intact Macroscopic intactness of mesorectum: Complete: _ Macroscopic tumor perforation: Negative Invasive tumor: Maximum size: Histologic type(s): G2 Histologic grade and differentiation: G1: well  differentiated/low grade G2: moderately differentiated/low grade G3: poorly differentiated/high grade G4: undifferentiated/high grade Type of polyp in which invasive carcinoma arose: _ Microscopic extension of invasive tumor:Submucosa Lymph-Vascular invasion: Not identified Peri-neural invasion: negative Tumor deposit(s) (discontinuous extramural extension): Negative Resection margins: Proximal margin: Negative Distal margin: Negative Circumferential (radial) (posterior ascending, posterior descending; lateral and posterior mid-rectum; and entire lower 1/3 rectum):Negatvie Mesenteric margin (sigmoid and transverse): Negative Distance closest margin (if all above margins negative): 2.5 cm Treatment effect (neo-adjuvant therapy): Present Additional polyp(s): Negative Non-neoplastic findings: Unremarkable Lymph nodes: number examined 12; number positive: 0 Pathologic Staging: T1, N0, M_ Ancillary studies: Preserved expression of the makor and minor MMR proteins MLH1, MSH2, MSH6 and PMS2. MLH1:    RADIOGRAPHIC STUDIES: I have personally reviewed the radiological images as listed and agreed with the  findings in the report.  Ct Abdomen Pelvis W Contrast 01/20/2015    IMPRESSION: Right lateral rectal wall exophytic mass likely corresponding to the known neoplasm.  Two ill-defined hepatic hypo enhancing lesions which appears to correspond to the lesions seen on the prior MRI. Please note evaluation of the liver is very limited due to streak artifact. If there is clinical concern for new metastatic disease MRI may provide better evaluation.  Small left ovarian cystic lesion corresponding to the lesion reported on the prior pelvic ultrasound. Ultrasound may provide better evaluation of the pelvic structures.   Electronically Signed   By: Anner Crete M.D.   On: 01/20/2015 16:15   Lower endoscopic ultrasound by Dr. Paulita Fujita 01/18/2015 FINDINGS: Normal digital rectal exam; could not palpate mass. In proximal rectum extending to the distal rectum, from 12 cm to 20 cm from the anal verge, a partial circumferential (50-75% circumferential distally, 75-100% circumferential proximall) mass was seen. Mass was firm, fixed, ulcerated, friable. EUS radial scope was able to traverse the tumor into normal-appearing distal sigmoid colon. Lesion invaded through the wall of the rectum into neighboring connective tissues in multiple regions. There were several round hypoechoic well-defined malignant-appearing lymph nodes in the vicinity of the proximal portion of the tumor. STAGING: T3 N2 Mx via rectal ultrasound ENDOSCOPIC IMPRESSION: As above. Rectosigmoid mass, biopsy-proven adenocarcinoma, with local invasion noted under ultrasound. RECOMMENDATIONS: 1. Watch for potential complications of procedure. 2. Needs CT chest, abdomen, pelvis to complete staging. 3. Based already on rectal ultrasound results, patient would not be candidate for upfront surgery. She will need surgical and radiation oncology consultations. 4. Will discuss with Dr. Amedeo Plenty.  CT chest 02/07/2015 IMPRESSION: 1. No findings of metastatic  disease to the chest.  CT abdomen and pelvis w contrast 02/21/2016 IMPRESSION: 1. Interval removal of surgical drains since 12/19/2015 from the presacral space. Similar size of ill-defined presacral fluid and gas, for which residual abscess cannot be excluded. Similar amount of intraperitoneal edema throughout the upper pelvis with foci of extraluminal gas, possibly related to the presacral process. 2. Diverting right-sided ileostomy, without acute complication. 3. Similar moderate hydroureteronephrosis, likely due to the pelvic process. 4. Possible bladder wall thickening. Correlate with urinalysis. This appearance could be partially due to underdistention. 5. Geographic hepatic steatosis and hemangiomas. Cannot exclude a new right hepatic lobe lesion. Consider nonemergent pre and post contrast outpatient MRI. This could represent a new area of focal steatosis.    ASSESSMENT & PLAN: Ms. Fojtik is a 50 year old female with chronic abdominal pain with rectal mass on colonoscopy found to be invasive rectal adenocarcinoma.   1. T3N2M0, Stage IIIC rectal adenocarcinoma, ypT1N0 after neoadjuvant chemotherapy and radiation -I  previously reviewed her  Surgical pathology result in great detail with patient and her husband. She has had a good response to neoadjuvant chemoradiation, has a small residual tumor on the surgical specimen, no  Lymph node metastasis. -I recommended adjuvant chemotherapy, however she has not been able to take it due to the pelvic abscess after surgery. - We'll continue her surveillance. She had a multiple CT of the abdomen and pelvis after surgery, last one in June 2017, which did not  Show evidence of recurrence. Her tumor marker CEA has been normal.  -Due to her abscess, she would like to postpone her colonoscopy  2. Diarrhea and low grade fever, history of Pelvic abscess -She unfortunately developed a recurrent pelvic abscess after her surgery,  managed by her  surgeon Dr. Johney Maine, she is status post ileostomy, draining tube has been removed. -her recent CT showed no recurrent abscess, her fever resolved after oral antibiotics  -continue IVF and supportive care  3. Microcytic anemia, secondary to GI blood loss and iron deficiency anemia -she responded to IV for him very well. Her hemoglobin dropped to 6.4 when she was in the hospital a months ago, now has improved to 11.3 after 2 doses of IV Feraheme -We'll continue monitoring her iron level   4. HTN -She'll continue follow-up with her primary care physician -Her HTN medication Has Been Held Due To Her Dehydration after Hospitalization, her blood pressure fluctuates, we'll monitor for now.  6.  Anorexia and depression -She tries mirtazapine, did not help. - I encouraged her to take nutrition supplement  7. Dehydration  -She still has intermittent high ileostomy output, requires IV fluids -Continue IV fluids, with NS 1.5 L twice a week for the next 3 weeks, and reassess her in 3 weeks   Plan: -NS  1.5 L every Monday and Thursday for the next 3 weeks -I'll see her back in 3 weeks, sooner if needed    I spent 20 mins for the visit, >50% face to face discussion.   Truitt Merle  03/11/2016

## 2016-03-11 NOTE — Patient Instructions (Signed)

## 2016-03-11 NOTE — Progress Notes (Signed)
Unable to obtain blood return upon port access. Pt stated that she is finished with chemo, and MD is fine without blood return as long as she is just receiving fluids. If she were to restart chemo, pt would need new port.

## 2016-03-11 NOTE — Progress Notes (Signed)
left [employer] form for dr. Burr Medico to sign

## 2016-03-11 NOTE — Telephone Encounter (Signed)
AVS REPORT AND SCHD GIVEN PER 08/28 LOS

## 2016-03-12 ENCOUNTER — Inpatient Hospital Stay (HOSPITAL_COMMUNITY): Admission: RE | Admit: 2016-03-12 | Payer: No Typology Code available for payment source | Source: Ambulatory Visit

## 2016-03-12 ENCOUNTER — Encounter: Payer: Self-pay | Admitting: Hematology

## 2016-03-12 ENCOUNTER — Telehealth: Payer: Self-pay | Admitting: Hematology

## 2016-03-12 NOTE — Progress Notes (Signed)
left [employer] form for dr. Burr Medico to sign-faxed [phone] and sent to medical recrds

## 2016-03-12 NOTE — Progress Notes (Signed)
Mailed copy to patient for her recrds= see prev notes

## 2016-03-12 NOTE — Telephone Encounter (Signed)
Patient appointments schedule corrected/updated per 8/28 los and 8/29 sch message. Patient to return to [facility] for IVF's 8/31, [facility] for IVF's 9/5 and 9/7, [facility] for lab/IVf's 9/11, IVF's 9/14, FU/IVF's 9/18 and IVF's 9/21. Spoke with patient she is aware of each appointment date/location.

## 2016-03-14 ENCOUNTER — Ambulatory Visit (HOSPITAL_BASED_OUTPATIENT_CLINIC_OR_DEPARTMENT_OTHER): Payer: No Typology Code available for payment source

## 2016-03-14 ENCOUNTER — Encounter (HOSPITAL_COMMUNITY): Payer: No Typology Code available for payment source

## 2016-03-14 ENCOUNTER — Ambulatory Visit: Payer: No Typology Code available for payment source

## 2016-03-14 DIAGNOSIS — E860 Dehydration: Secondary | ICD-10-CM | POA: Diagnosis not present

## 2016-03-14 DIAGNOSIS — C20 Malignant neoplasm of rectum: Secondary | ICD-10-CM

## 2016-03-14 MED ORDER — SODIUM CHLORIDE 0.9 % IV SOLN: 1000.0000 mL | INTRAVENOUS | Status: DC

## 2016-03-14 MED ORDER — SODIUM CHLORIDE 0.9 % IV SOLN
1500.0000 mL | Freq: Once | INTRAVENOUS | Status: AC
  Administered 2016-03-14: 1000 mL via INTRAVENOUS

## 2016-03-14 NOTE — Patient Instructions (Signed)

## 2016-03-16 ENCOUNTER — Encounter: Payer: Self-pay | Admitting: Hematology

## 2016-03-19 ENCOUNTER — Ambulatory Visit (HOSPITAL_COMMUNITY)
Admission: RE | Admit: 2016-03-19 | Discharge: 2016-03-19 | Disposition: A | Discharge status: Home / Self Care | Payer: No Typology Code available for payment source | Source: Ambulatory Visit | Attending: Hematology | Admitting: Hematology

## 2016-03-19 ENCOUNTER — Other Ambulatory Visit: Payer: Self-pay | Admitting: *Deleted

## 2016-03-19 ENCOUNTER — Ambulatory Visit: Payer: No Typology Code available for payment source

## 2016-03-19 ENCOUNTER — Telehealth: Payer: Self-pay

## 2016-03-19 DIAGNOSIS — D638 Anemia in other chronic diseases classified elsewhere: Secondary | ICD-10-CM | POA: Diagnosis not present

## 2016-03-19 DIAGNOSIS — E860 Dehydration: Secondary | ICD-10-CM

## 2016-03-19 MED ORDER — SODIUM CHLORIDE 0.9 % IJ SOLN
10.0000 mL | Freq: Once | INTRAMUSCULAR | Status: AC
  Administered 2016-03-19: 10 mL

## 2016-03-19 MED ORDER — SODIUM CHLORIDE 0.9 % IV SOLN
Freq: Once | INTRAVENOUS | Status: AC
  Administered 2016-03-19: 10:00:00 via INTRAVENOUS

## 2016-03-19 MED ORDER — HEPARIN SOD (PORK) LOCK FLUSH 100 UNIT/ML IV SOLN
500.0000 [IU] | Freq: Once | INTRAVENOUS | Status: AC
  Administered 2016-03-19: 500 [IU]
  Filled 2016-03-19: qty 5

## 2016-03-19 NOTE — Discharge Instructions (Signed)
Pt received normal saline over hrs at 750cc/hr today.

## 2016-03-19 NOTE — Progress Notes (Signed)
Anemia of chronic disease - Primary     ICD-9-CM: 285.29 ICD-10-CM: D63.8       Provider: Dr. Burr Medico  Procedure: Pt received 1.5 liters of normal saline over 2 hours.  Pt tolerated well.  Post procedure: Pt was alert, oriented and ambulatory at discharge. Discharge instructions given to patient.. Pt's porta cath left accessed per order. Glori Luis was flushed per protocol.

## 2016-03-21 ENCOUNTER — Ambulatory Visit: Payer: No Typology Code available for payment source

## 2016-03-21 ENCOUNTER — Other Ambulatory Visit: Payer: Self-pay | Admitting: *Deleted

## 2016-03-21 ENCOUNTER — Ambulatory Visit (HOSPITAL_COMMUNITY)
Admission: RE | Admit: 2016-03-21 | Discharge: 2016-03-21 | Disposition: A | Discharge status: Home / Self Care | Payer: No Typology Code available for payment source | Source: Ambulatory Visit | Attending: Family Medicine | Admitting: Family Medicine

## 2016-03-21 DIAGNOSIS — E860 Dehydration: Secondary | ICD-10-CM

## 2016-03-21 DIAGNOSIS — D638 Anemia in other chronic diseases classified elsewhere: Secondary | ICD-10-CM | POA: Diagnosis not present

## 2016-03-21 MED ORDER — HEPARIN SOD (PORK) LOCK FLUSH 100 UNIT/ML IV SOLN
500.0000 [IU] | Freq: Once | INTRAVENOUS | Status: AC
  Administered 2016-03-21: 500 [IU]
  Filled 2016-03-21: qty 5

## 2016-03-21 MED ORDER — SODIUM CHLORIDE 0.9 % IV SOLN
1500.0000 mL | Freq: Once | INTRAVENOUS | Status: AC
  Administered 2016-03-21: 1000 mL via INTRAVENOUS

## 2016-03-21 MED ORDER — SODIUM CHLORIDE 0.9 % IJ SOLN: 10.0000 mL | Freq: Once | INTRAMUSCULAR | Status: DC

## 2016-03-21 NOTE — Progress Notes (Signed)
Pt arrived for infusion of Normal Saline IV per order  Ordering Provider: Dr. Burr Medico  Procedure: IV infusion of 1.5 liters of normal saline  Diagnosis: Dehydration (E86.0)  Pt tolerated well  Port deaccessed with no complications noted

## 2016-03-21 NOTE — Discharge Instructions (Signed)

## 2016-03-22 ENCOUNTER — Other Ambulatory Visit: Payer: Self-pay | Admitting: *Deleted

## 2016-03-22 ENCOUNTER — Telehealth: Payer: Self-pay | Admitting: *Deleted

## 2016-03-22 ENCOUNTER — Other Ambulatory Visit: Payer: Self-pay | Admitting: Medical Oncology

## 2016-03-22 DIAGNOSIS — C20 Malignant neoplasm of rectum: Secondary | ICD-10-CM

## 2016-03-22 DIAGNOSIS — E860 Dehydration: Secondary | ICD-10-CM

## 2016-03-22 NOTE — Telephone Encounter (Signed)
Per staff message I have scheduled appt and called the patient

## 2016-03-23 ENCOUNTER — Ambulatory Visit (HOSPITAL_BASED_OUTPATIENT_CLINIC_OR_DEPARTMENT_OTHER): Payer: No Typology Code available for payment source

## 2016-03-23 DIAGNOSIS — C20 Malignant neoplasm of rectum: Secondary | ICD-10-CM

## 2016-03-23 DIAGNOSIS — E860 Dehydration: Secondary | ICD-10-CM

## 2016-03-23 MED ORDER — HEPARIN SOD (PORK) LOCK FLUSH 100 UNIT/ML IV SOLN
500.0000 [IU] | Freq: Once | INTRAVENOUS | Status: AC
  Administered 2016-03-23: 500 [IU] via INTRAVENOUS
  Filled 2016-03-23: qty 5

## 2016-03-23 MED ORDER — SODIUM CHLORIDE 0.9 % IV SOLN
Freq: Once | INTRAVENOUS | Status: AC
  Administered 2016-03-23: 09:00:00 via INTRAVENOUS

## 2016-03-23 MED ORDER — SODIUM CHLORIDE 0.9% FLUSH
10.0000 mL | INTRAVENOUS | Status: DC | PRN
  Administered 2016-03-23: 10 mL via INTRAVENOUS
  Filled 2016-03-23: qty 10

## 2016-03-23 NOTE — Patient Instructions (Signed)

## 2016-03-25 ENCOUNTER — Other Ambulatory Visit (HOSPITAL_BASED_OUTPATIENT_CLINIC_OR_DEPARTMENT_OTHER): Payer: No Typology Code available for payment source

## 2016-03-25 ENCOUNTER — Ambulatory Visit (HOSPITAL_BASED_OUTPATIENT_CLINIC_OR_DEPARTMENT_OTHER): Payer: No Typology Code available for payment source

## 2016-03-25 VITALS — BP 133/78 | HR 70 | Temp 98.6°F | Resp 18

## 2016-03-25 DIAGNOSIS — E860 Dehydration: Secondary | ICD-10-CM

## 2016-03-25 DIAGNOSIS — D638 Anemia in other chronic diseases classified elsewhere: Secondary | ICD-10-CM

## 2016-03-25 DIAGNOSIS — D500 Iron deficiency anemia secondary to blood loss (chronic): Secondary | ICD-10-CM

## 2016-03-25 DIAGNOSIS — C20 Malignant neoplasm of rectum: Secondary | ICD-10-CM

## 2016-03-25 DIAGNOSIS — D509 Iron deficiency anemia, unspecified: Secondary | ICD-10-CM

## 2016-03-25 LAB — CBC WITH DIFFERENTIAL/PLATELET
BASO%: 0.7 % (ref 0.0–2.0)
Basophils Absolute: 0.1 10*3/uL (ref 0.0–0.1)
EOS%: 3.2 % (ref 0.0–7.0)
Eosinophils Absolute: 0.3 10*3/uL (ref 0.0–0.5)
HCT: 42.7 % (ref 34.8–46.6)
HGB: 13.7 g/dL (ref 11.6–15.9)
LYMPH%: 13.3 % — ABNORMAL LOW (ref 14.0–49.7)
MCH: 27.7 pg (ref 25.1–34.0)
MCHC: 32.1 g/dL (ref 31.5–36.0)
MCV: 86.3 fL (ref 79.5–101.0)
MONO#: 0.5 10*3/uL (ref 0.1–0.9)
MONO%: 6.6 % (ref 0.0–14.0)
NEUT#: 6.1 10*3/uL (ref 1.5–6.5)
NEUT%: 76.2 % (ref 38.4–76.8)
Platelets: 412 10*3/uL — ABNORMAL HIGH (ref 145–400)
RBC: 4.95 10*6/uL (ref 3.70–5.45)
RDW: 16.3 % — ABNORMAL HIGH (ref 11.2–14.5)
WBC: 8.1 10*3/uL (ref 3.9–10.3)
lymph#: 1.1 10*3/uL (ref 0.9–3.3)

## 2016-03-25 LAB — IRON AND TIBC
%SAT: 22 % (ref 21–57)
Iron: 78 ug/dL (ref 41–142)
TIBC: 350 ug/dL (ref 236–444)
UIBC: 272 ug/dL (ref 120–384)

## 2016-03-25 LAB — COMPREHENSIVE METABOLIC PANEL
ALT: 15 U/L (ref 0–55)
AST: 16 U/L (ref 5–34)
Albumin: 3.8 g/dL (ref 3.5–5.0)
Alkaline Phosphatase: 84 U/L (ref 40–150)
Anion Gap: 10 mEq/L (ref 3–11)
BUN: 14.4 mg/dL (ref 7.0–26.0)
CO2: 19 mEq/L — ABNORMAL LOW (ref 22–29)
Calcium: 10.7 mg/dL — ABNORMAL HIGH (ref 8.4–10.4)
Chloride: 111 mEq/L — ABNORMAL HIGH (ref 98–109)
Creatinine: 0.9 mg/dL (ref 0.6–1.1)
EGFR: 72 mL/min/{1.73_m2} — ABNORMAL LOW (ref >90)
Glucose: 72 mg/dl (ref 70–140)
Potassium: 4.3 mEq/L (ref 3.5–5.1)
Sodium: 140 mEq/L (ref 136–145)
Total Bilirubin: 0.34 mg/dL (ref 0.20–1.20)
Total Protein: 8.9 g/dL — ABNORMAL HIGH (ref 6.4–8.3)

## 2016-03-25 LAB — CEA (IN HOUSE-CHCC): CEA (CHCC-IN HOUSE): 1.67 ng/mL (ref 0.00–5.00)

## 2016-03-25 LAB — MAGNESIUM: Magnesium: 2 mg/dl (ref 1.5–2.5)

## 2016-03-25 MED ORDER — HEPARIN SOD (PORK) LOCK FLUSH 100 UNIT/ML IV SOLN
500.0000 [IU] | Freq: Once | INTRAVENOUS | Status: AC | PRN
  Administered 2016-03-25: 500 [IU]
  Filled 2016-03-25: qty 5

## 2016-03-25 MED ORDER — SODIUM CHLORIDE 0.9 % IJ SOLN
10.0000 mL | INTRAMUSCULAR | Status: DC | PRN
  Administered 2016-03-25: 10 mL
  Filled 2016-03-25: qty 10

## 2016-03-25 MED ORDER — SODIUM CHLORIDE 0.9 % IV SOLN
Freq: Once | INTRAVENOUS | Status: AC
  Administered 2016-03-25: 13:00:00 via INTRAVENOUS

## 2016-03-25 NOTE — Progress Notes (Signed)
Pt has been experiencing loose stools in her ostomy for the past 6 days. At home, she has been taking labetol, immodium, and increasing her fiber. The result will be two more solid stools, but little change. She is experiencing increasing fatigue and difficulty performing ADLs alone. Pt also complains of nausea and decreased appetite. Contacted Cyndee Berniece Salines who is aware of the pt situation. Was referred to Dr. Burr Medico who would like to schedule an additional IVF appt this Wednesday. Contacted [place], Dr. Ernestina Penna RN, and she will get in touch with [facility] to get an appt time on Wednesday for Ms. Lund. Janifer Adie will f/u with phone call to pt so that she knows when the appt will be.

## 2016-03-25 NOTE — Patient Instructions (Signed)

## 2016-03-26 LAB — CEA: CEA: 2.2 ng/mL (ref 0.0–4.7)

## 2016-03-28 ENCOUNTER — Ambulatory Visit (HOSPITAL_BASED_OUTPATIENT_CLINIC_OR_DEPARTMENT_OTHER): Payer: No Typology Code available for payment source

## 2016-03-28 VITALS — BP 133/79 | HR 84 | Temp 97.8°F | Resp 12

## 2016-03-28 DIAGNOSIS — C20 Malignant neoplasm of rectum: Secondary | ICD-10-CM | POA: Diagnosis not present

## 2016-03-28 DIAGNOSIS — D638 Anemia in other chronic diseases classified elsewhere: Secondary | ICD-10-CM

## 2016-03-28 DIAGNOSIS — E860 Dehydration: Secondary | ICD-10-CM

## 2016-03-28 MED ORDER — SODIUM CHLORIDE 0.9 % IJ SOLN
10.0000 mL | INTRAMUSCULAR | Status: DC | PRN
  Administered 2016-03-28: 10 mL
  Filled 2016-03-28: qty 10

## 2016-03-28 MED ORDER — SODIUM CHLORIDE 0.9 % IV SOLN
Freq: Once | INTRAVENOUS | Status: AC
  Administered 2016-03-28: 12:00:00 via INTRAVENOUS

## 2016-03-28 MED ORDER — HEPARIN SOD (PORK) LOCK FLUSH 100 UNIT/ML IV SOLN
500.0000 [IU] | Freq: Once | INTRAVENOUS | Status: AC | PRN
  Administered 2016-03-28: 500 [IU]
  Filled 2016-03-28: qty 5

## 2016-03-28 NOTE — Patient Instructions (Signed)

## 2016-04-01 ENCOUNTER — Ambulatory Visit: Payer: No Typology Code available for payment source | Admitting: Hematology

## 2016-04-01 ENCOUNTER — Ambulatory Visit: Payer: No Typology Code available for payment source

## 2016-04-01 NOTE — Progress Notes (Deleted)
[place]  [phone] [phone]  Clinic Follow Up Note   Patient Care Team: [name], MD as PCP - General (Family Medicine) Michael Boston, MD as Consulting Physician (General Surgery) Truitt Merle, MD as Consulting Physician (Medical Oncology) Kyung Rudd, MD as Consulting Physician (Radiation Oncology) Teena Irani, MD as Consulting Physician (Gastroenterology) Raynelle Bring, MD as Consulting Physician (Urology) 04/01/2016  CHIEF COMPLAINTS:  Follow up rectal cancer   Oncology History   Rectal adenocarcinoma   Staging form: Colon and Rectum, AJCC 7th Edition     Clinical: T3, N2, M0 - Unsigned       Rectal adenocarcinoma s/p LAR resection 05/25/2015   01/13/2015 Initial Diagnosis    Rectal adenocarcinoma      01/13/2015 Procedure    Colonoscopy showed a sensitivity O nonobstructing mass in the rectum and from 12-18 cm proximal to the Annis. The mass was circumferential, measuring about 6 cm in length. EGD was negative.      01/17/2015 Tumor Marker    CEA 1.4, CA-19-9 8, MMR normal       01/18/2015 Procedure    Lower endoscopic ultrasound by Dr. Paulita Fujita showed a T3 N2 rectal mass.      01/20/2015 Imaging    CT abdomen and pelvis with contrast showed right lateral rectal wall exophytic mass, and 2 ill-defined hepatic hypoenhancing lesions which appears to correspond to the lesion seen on the prior MRI in 2015.      02/05/2015 Imaging    abdomen MRI showed 2 hemangioma, no suspicion for metastatic disease. CT chest was negative      02/20/2015 - 03/30/2015 Radiation Therapy    neoadjuvant RT to rectal cancer       02/20/2015 Concurrent Chemotherapy    capecitabine 2500 mg in the morning and 2000 mg in the evening (826m/m2, bid), on the day of radiation.       05/25/2015 Surgery    Recto-sigmoid segmental resection, margins are negative       05/25/2015 Pathology Results    0.2cm residual invasive adenocarcinoma, G2, negative margins,  12 nodes were negative       06/23/2015 - 06/29/2015 Hospital Admission    Patient was admitted for pelvic abscess, drain placed and she was treated with IV antibiotics, she also received 1 dose of Feraheme for anemia.      09/15/2015 - 09/17/2015 Hospital Admission    Recurrent pelvic collection s/p perc drainage 09/16/2015       HISTORY OF PRESENTING ILLNESS:  Lindsay HOBAN50y.o. female is here because of evaluation for management for newly diagnosed rectal adenocarcinoma.   On 09/06/13, she presented to Dr. HStarr SinclairGI] with 43-monthistory of diarrhea and worsening abdominal pain. She has a history of chronic abdominal pain and had been previously diagnosed [place] by Dr. HaAmedeo PlentyAbdominal [place]/6/15 noted to have some hypoechoicity in the left hepatic lobe which was confirmed on MRI to be two hemangiomas  in the right hepatic lobe of measuring up to 3.0 cm along with an additional 7 mm probable cyst.   She presented with worsening of diarrhea for 6 month and was reevaluated by Dr. HaAmedeo PlentyShe was seen on 12/06/14 at which time it was decided she would undergo HIDA scan given that the pain was intermittently in the RUQ in addition to upper and lower endoscopy. HIDA scan 12/15/14 was reassuring, but she  was found to have a malignant tumor of the rectum about 12-18 cm proximal to the anus, gastritis, gastric polyps on endoscopies 01/13/15. Biopsies for these three specimens along with the duodenum were collected and resulted on 01/17/15 for invasive adenocarcinoma, chronic gastritis [H. Pylori negative], fundic gland polyps, and non-specific mild patchy intra-epithelial lymphocytes. Endoscopic [place] 01/18/15 staged the mass at T3N2Mx and she is here today for consideration of neoadjuvant therapy.    Today, she does report abdominal pain but is no different than her baseline [right-sided, upper, lower] but has had worsening  appetite over the last 2 months. She does have diarrhea with bowel movements every 1-2 hours, especially after eating, and has been noticing bleeding with her bowel movement following her endoscopic evaluation. She also feels her energy level has decreased. Otherwise, she denies nausea, chest pain, shortness of breath.    Family history is notable for a maternal uncle who had pancreatic cancer diagnosed in his mid to late 39s that spread to the colon and prostate before he died about 23 years later and a sister with non-melanoma skin cancer. 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 She smoked roughly 20 pack years [1 pack/week x 7 years] in the past and reports occasional alcohol use but denies any prior illicit drug use.  She lost about 7 lbs in the past 2 months  CURRENT THERAPY: supportive care, IVF 2-3 times a week    INTERIM HISTORY Lindsay Little returns for follow-up. Her fever has resolved after the course of cipro, she has missed a few doses of cipro due to nausea, and will finish in a few days. Her diarrhea and rectal discharge overall has improved also, but she still not feeling well sometime, with nausea, diarrhea, low appetite, she is very frustrated.    HISTORY:  Past Medical History:  Diagnosis Date  . Anemia   . Anxiety   . Bulging disc    L5  . Cancer ([place])   . Complication of anesthesia   . 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 Depression   . Fibroids   . GERD (gastroesophageal reflux disease)   . Heart murmur   . History of blood transfusion   . History of chemotherapy   . History of frequent ear infections   . History of frequent urinary tract infections    middle school age  . History of hiatal hernia   . History of mitral valve prolapse   . Hypercholesteremia   . Hypertension   . IBS (irritable bowel syndrome)   . Mild sleep apnea    does not use CPAP  . Numbness and tingling    great  toe bilat left more than right   . Obesity   . Occasional tremors   . Osteoarthritis, knee   . PONV (postoperative nausea and vomiting)   . Rectal bleeding   . S/P radiation therapy 02/20/15-03/30/15   colon/rectal  . Tear of left rotator cuff   . TMJ (dislocation of temporomandibular joint)   . Vertigo   . Vitamin D deficiency   . Wears glasses     SURGICAL HISTORY: Past Surgical History:  Procedure Laterality Date  . ABDOMINAL HYSTERECTOMY  1996   Removed uterus, and tubes  . DILATION AND CURETTAGE OF UTERUS     times 2  . EUS N/A 01/18/2015   Procedure: LOWER ENDOSCOPIC ULTRASOUND (EUS);  Surgeon: Arta Silence, MD;  Location: [facility];  Service: Endoscopy;  Laterality: N/A;  . EXCISION OF SKIN TAG  11/17/2015   Procedure: EXCISION OF SKIN TAG;  Surgeon: Michael Boston, MD;  Location: [facility];  Service: General;;  . ILEO LOOP COLOSTOMY CLOSURE N/A 11/17/2015   Procedure: LAPAROSCOPIC DIVERTING LOOP ILEOSTOMY  DRAINAGE OF PELVIC ABSCESS;  Surgeon: Michael Boston, MD;  Location: [facility];  Service: General;  Laterality: N/A;  . IMPACTION REMOVAL  11/17/2015   Procedure: DISIMPACTION REMOVAL;  Surgeon: Michael Boston, MD;  Location: [facility];  Service: General;;  . JP drain     . LAPAROSCOPIC LYSIS OF ADHESIONS  11/17/2015   Procedure: LAPAROSCOPIC LYSIS OF ADHESIONS;  Surgeon: Michael Boston, MD;  Location: [facility];  Service: General;;  . left knee arthroscopy     mult  . PORTACATH PLACEMENT N/A 05/25/2015   Procedure: INSERTION PORT-A-CATH;  Surgeon: Michael Boston, MD;  Location: [facility];  Service: General;  Laterality: N/A;  . ROTATOR CUFF REPAIR    . TONSILLECTOMY    . XI ROBOTIC ASSISTED LOWER ANTERIOR RESECTION N/A 05/25/2015   Procedure: XI ROBOTIC ASSISTED LOWER ANTERIOR RESECTION, , RIGID PROCTOSCOPY, RIGHT OOPHORECTOMY;  Surgeon: Michael Boston, MD;  Location: [facility];  Service: General;  Laterality: N/A;    SOCIAL HISTORY: Social History   Social History  . Marital status: Married     Spouse name: N/A  . Number of children: N/A  . Years of education: N/A   Occupational History  . Not on file.   Social History Main Topics  . Smoking status: Former Smoker    Packs/day: 0.50    Years: 7.00    Types: Cigarettes    Quit date: 07/16/1991  . Smokeless tobacco: Never Used     Comment: 1 pack/week intermittently x 7 years  . Alcohol use 0.6 oz/week    1 Shots of liquor per week     Comment: rarely   . Drug use: No  . Sexual activity: Not on file   Other Topics Concern  . Not on file   Social History Narrative   Tobacco Use: Cigarettes - Former Smoker   Alcohol: Yes, very rare, liquor.    No recreational drug use   Occupation: Head CMA @ [employer]   Marital Status: Married    Husband: Roselyn Reef Disabled   Children: 2 adopted kids [place]   Religion: [employer] [place] HISTORY: Family History  Problem Relation Age of Onset  . Coronary artery disease Mother 53  . Hypertension Mother   . Hyperlipidemia Mother   . Diabetes Mellitus I Mother   . Coronary artery disease Father   . Hyperlipidemia Father   . Hypertension Father   . Cancer Sister     skin - non melanoma  . Hyperlipidemia Brother   . Cancer Maternal Uncle 70    pancreatic with mets to colon and prostate  . Cirrhosis Maternal Uncle   . Hypertension Maternal Grandmother   . Diabetes Mellitus I Maternal Grandmother   . Hyperlipidemia Maternal Grandmother   . CVA Maternal Grandmother   . Hypertension Maternal Grandfather   . Coronary artery disease Maternal Grandfather 65  . Hyperlipidemia Maternal Grandfather   . Coronary artery disease Paternal Grandmother   . Hypertension Paternal Grandmother   . Hyperlipidemia Paternal Grandmother   . Diabetes Mellitus I Paternal Grandmother   . Hypertension  Paternal Grandfather   . Hyperlipidemia Paternal Grandfather   . Coronary artery disease Paternal Grandfather     ALLERGIES:  is allergic to caine-1  [lidocaine]; flagyl [metronidazole]; adhesive [tape]; iron; oxycodone; penicillins; and sulfa antibiotics.  MEDICATIONS:  Current Outpatient Prescriptions  Medication Sig Dispense Refill  . BYSTOLIC 10 MG tablet Take 10 mg by mouth daily. Reported on 01/22/2016  0  . ciprofloxacin (CIPRO) 500 MG tablet Take 1 tablet (500 mg total) by mouth 2 (two) times daily. 28 tablet 0  . Coenzyme Q10 (COQ-10) 100 MG CAPS Take 100 mg by mouth daily.    . diphenoxylate-atropine (LOMOTIL) 2.5-0.025 MG tablet Take 1 tablet by mouth 4 (four) times daily as needed for diarrhea or loose stools. 30 tablet 1  . DULoxetine (CYMBALTA) 60 MG capsule Take 60 mg by mouth at bedtime.     Marland Kitchen esomeprazole (NEXIUM) 40 MG capsule Take 1 capsule (40 mg total) by mouth 2 (two) times daily before a meal. 60 capsule 5  . HYDROcodone-acetaminophen (NORCO/VICODIN) 5-325 MG tablet Take 1-2 tablets by mouth every 6 (six) hours as needed for moderate pain or severe pain. 60 tablet 0  . hyoscyamine (LEVBID) 0.375 MG 12 hr tablet Take 0.375 mg by mouth daily.     Marland Kitchen lidocaine-prilocaine (EMLA) cream Apply to affected area once (Patient taking differently: Apply 1 application topically daily as needed (For port-a-cath.). ) 30 g 3  . meloxicam (MOBIC) 15 MG tablet Take 15 mg by mouth daily as needed for pain.     . methocarbamol (ROBAXIN) 500 MG tablet Take 2 tablets (1,000 mg total) by mouth every 6 (six) hours as needed for muscle spasms. (Patient taking differently: Take 500-1,000 mg by mouth every 8 (eight) hours as needed for muscle spasms. ) 20 tablet 3  . ondansetron (ZOFRAN) 8 MG tablet Take 1 tablet (8 mg total) by mouth 2 (two) times daily. Start the day after chemo for 2 days. Then take as needed for nausea or vomiting. (Patient taking differently: Take 8 mg by mouth 2 (two) times daily as needed for nausea or vomiting. Start the day after chemo for 2 days. Then take as needed for nausea or vomiting.) 10 tablet 3  . potassium chloride  SA (K-DUR,KLOR-CON) 20 MEQ tablet Take 1 tablet (20 mEq total) by mouth 2 (two) times daily. 6 tablet 0  . promethazine (PHENERGAN) 25 MG tablet Take 25 mg by mouth every 8 (eight) hours as needed for nausea or vomiting.     . psyllium (METAMUCIL) 58.6 % powder Take 1 packet by mouth 2 (two) times daily as needed.     Marland Kitchen rOPINIRole (REQUIP) 0.25 MG tablet TAKE ONE TABLET BY MOUTH 1-3 hours BEFORE bedtime  5  . temazepam (RESTORIL) 15 MG capsule Take 15 mg by mouth at bedtime as needed for sleep (one to three hours before bed).    . traMADol (ULTRAM) 50 MG tablet Take 50 mg by mouth 2 (two) times daily as needed for moderate pain. pain  2  . Vitamin D, Ergocalciferol, (DRISDOL) 50000 UNITS CAPS capsule Take 50,000 Units by mouth 2 (two) times a week.     No current facility-administered medications for this visit.     REVIEW OF SYSTEMS:   Constitutional: Denies fevers, chills or abnormal night sweats Eyes: Denies blurriness of vision, double vision or watery eyes Ears, nose, mouth, throat, and face: Denies mucositis or sore throat Respiratory: Denies cough, dyspnea or wheezes Cardiovascular: Denies palpitation, chest discomfort or  lower extremity swelling Gastrointestinal:(+) abdominal pain  Skin: Denies abnormal skin rashes Lymphatics: Denies new lymphadenopathy or easy bruising Neurological:Denies numbness, tingling or new weaknesses Behavioral/Psych: Mood is stable, no new changes  All other systems were reviewed with the patient and are negative.  PHYSICAL EXAMINATION: ECOG PERFORMANCE STATUS: 2-3  There were no vitals filed for this visit. There were no vitals filed for this visit.  GENERAL:alert, no distress and comfortable SKIN: skin color, texture, turgor are normal, no rashes or significant lesions EYES: normal, conjunctiva are pink and non-injected, sclera clear OROPHARYNX:no exudate, no erythema and lips, buccal mucosa, and tongue normal  NECK: supple, thyroid normal size,  non-tender, without nodularity LYMPH:  no palpable lymphadenopathy in the cervical, axillary or inguinal LUNGS: clear to auscultation and percussion with normal breathing effort HEART: regular rate & rhythm and no murmurs and no lower extremity edema ABDOMEN:abdomen soft, normal bowel sounds. No organomegaly,  Multiple surgical incision scar are well healed. (+) Ileostomy back in the right side abdomen  Musculoskeletal:no cyanosis of digits and no clubbing  PSYCH: alert & oriented x 3 with fluent speech NEURO: no focal motor/sensory deficits   LABORATORY DATA:  I have reviewed the data as listed CBC Latest Ref Rng & Units 03/25/2016 03/11/2016 03/04/2016  WBC 3.9 - 10.3 10e3/uL 8.1 11.0(H) 11.2(H)  Hemoglobin 11.6 - 15.9 g/dL 13.7 11.0(L) 12.4  Hematocrit 34.8 - 46.6 % 42.7 34.9 39.1  Platelets 145 - 400 10e3/uL 412(H) 393 496(H)    CMP Latest Ref Rng & Units 03/25/2016 03/11/2016 03/04/2016  Glucose 70 - 140 mg/dl 72 142(H) 139  BUN 7.0 - 26.0 mg/dL 14.4 7.6 5.5(L)  Creatinine 0.6 - 1.1 mg/dL 0.9 0.9 0.8  Sodium 136 - 145 mEq/L 140 142 143  Potassium 3.5 - 5.1 mEq/L 4.3 3.8 3.6  Chloride 101 - 111 mmol/L - - -  CO2 22 - 29 mEq/L 19(L) 25 25  Calcium 8.4 - 10.4 mg/dL 10.7(H) 10.2 10.5(H)  Total Protein 6.4 - 8.3 g/dL 8.9(H) 7.9 8.1  Total Bilirubin 0.20 - 1.20 mg/dL 0.34 0.36 <0.30  Alkaline Phos 40 - 150 U/L 84 85 88  AST 5 - 34 U/L 16 9 8   ALT 0 - 55 U/L 15 <9 <9   CEA  Status: Finalresult Visible to patient:  Not Released Nextappt: Today at 09:45 AM in Oncology Burr Medico, Krista Blue, MD) Dx:  Rectal adenocarcinoma [facility])           Ref Range 3d ago  43moago     CEA 0.0 - 4.7 ng/mL 1.9 1.5CM         PATHOLOGY REPORT Diagnosis 05/25/2015 1. Colon, segmental resection for tumor, recto-sigmoid INVASIVE COLONIC ADENOCARCINOMA (0.2 CM IN GREATEST DIMENSION RESIDUAL TUMOR) THE TUMOR INVADE SUBMUCOSA (0.3 CM IN DEPTH, PT1) MARGINS OF RESECTION ARE NEGATIVE FOR  TUMOR TWELVE BENIGN LYMPH NODES (0/12) 2. Colon, resection margin (donut), proximal anastomotic ring BENIGN COLONIC TISSUE, NEGATIVE FOR MALIGANCY 3. Colon, resection margin (donut), distal anastomotic ring BENIGN COLONIC TISSUE, NEGATIVE FOR MALIGNANCY 4. Ovary, right BENIGN SEROUS CYSTADENOMA.  Microscopic Comment 1. COLON AND RECTUM (INCLUDING TRANS-ANAL RESECTION): Specimen: Colon-rectum Procedure: Segmental resection Tumor site: Rectum anterior wall mid to distal third of rectum Specimen integrity: intact Macroscopic intactness of mesorectum: Complete: _ Macroscopic tumor perforation: Negative Invasive tumor: Maximum size: Histologic type(s): G2 Histologic grade and differentiation: G1: well differentiated/low grade G2: moderately differentiated/low grade G3: poorly differentiated/high grade G4: undifferentiated/high grade Type of polyp in which invasive carcinoma arose: _ Microscopic  extension of invasive tumor:Submucosa Lymph-Vascular invasion: Not identified Peri-neural invasion: negative Tumor deposit(s) (discontinuous extramural extension): Negative Resection margins: Proximal margin: Negative Distal margin: Negative Circumferential (radial) (posterior ascending, posterior descending; lateral and posterior mid-rectum; and entire lower 1/3 rectum):Negatvie Mesenteric margin (sigmoid and transverse): Negative Distance closest margin (if all above margins negative): 2.5 cm Treatment effect (neo-adjuvant therapy): Present Additional polyp(s): Negative Non-neoplastic findings: Unremarkable Lymph nodes: number examined 12; number positive: 0 Pathologic Staging: T1, N0, M_ Ancillary studies: Preserved expression of the makor and minor MMR proteins MLH1, MSH2, MSH6 and PMS2. MLH1:    RADIOGRAPHIC STUDIES: I have personally reviewed the radiological images as listed and agreed with the findings in the report.  Ct Abdomen Pelvis W Contrast 01/20/2015    IMPRESSION:  Right lateral rectal wall exophytic mass likely corresponding to the known neoplasm.  Two ill-defined hepatic hypo enhancing lesions which appears to correspond to the lesions seen on the prior MRI. Please note evaluation of the liver is very limited due to streak artifact. If there is clinical concern for new metastatic disease MRI may provide better evaluation.  Small left ovarian cystic lesion corresponding to the lesion reported on the prior pelvic ultrasound. Ultrasound may provide better evaluation of the pelvic structures.   Electronically Signed   By: Anner Crete M.D.   On: 01/20/2015 16:15   Lower endoscopic ultrasound by Dr. Paulita Fujita 01/18/2015 FINDINGS: Normal digital rectal exam; could not palpate mass. In proximal rectum extending to the distal rectum, from 12 cm to 20 cm from the anal verge, a partial circumferential (50-75% circumferential distally, 75-100% circumferential proximall) mass was seen. Mass was firm, fixed, ulcerated, friable. EUS radial scope was able to traverse the tumor into normal-appearing distal sigmoid colon. Lesion invaded through the wall of the rectum into neighboring connective tissues in multiple regions. There were several round hypoechoic well-defined malignant-appearing lymph nodes in the vicinity of the proximal portion of the tumor. STAGING: T3 N2 Mx via rectal ultrasound ENDOSCOPIC IMPRESSION: As above. Rectosigmoid mass, biopsy-proven adenocarcinoma, with local invasion noted under ultrasound. RECOMMENDATIONS: 1. Watch for potential complications of procedure. 2. Needs CT chest, abdomen, pelvis to complete staging. 3. Based already on rectal ultrasound results, patient would not be candidate for upfront surgery. She will need surgical and radiation oncology consultations. 4. Will discuss with Dr. Amedeo Plenty.  CT chest 02/07/2015 IMPRESSION: 1. No findings of metastatic disease to the chest.  CT abdomen and pelvis w contrast 02/21/2016 IMPRESSION: 1.  Interval removal of surgical drains since 12/19/2015 from the presacral space. Similar size of ill-defined presacral fluid and gas, for which residual abscess cannot be excluded. Similar amount of intraperitoneal edema throughout the upper pelvis with foci of extraluminal gas, possibly related to the presacral process. 2. Diverting right-sided ileostomy, without acute complication. 3. Similar moderate hydroureteronephrosis, likely due to the pelvic process. 4. Possible bladder wall thickening. Correlate with urinalysis. This appearance could be partially due to underdistention. 5. Geographic hepatic steatosis and hemangiomas. Cannot exclude a new right hepatic lobe lesion. Consider nonemergent pre and post contrast outpatient MRI. This could represent a new area of focal steatosis.    ASSESSMENT & PLAN: Lindsay Little is a 50 year old female with chronic abdominal pain with rectal mass on colonoscopy found to be invasive rectal adenocarcinoma.   1. T3N2M0, Stage IIIC rectal adenocarcinoma, ypT1N0 after neoadjuvant chemotherapy and radiation -I previously reviewed her  Surgical pathology result in great detail with patient and her husband. She has had a good response to neoadjuvant  chemoradiation, has a small residual tumor on the surgical specimen, no  Lymph node metastasis. -I recommended adjuvant chemotherapy, however she has not been able to take it due to the pelvic abscess after surgery. - We'll continue her surveillance. She had a multiple CT of the abdomen and pelvis after surgery, last one in June 2017, which did not  Show evidence of recurrence. Her tumor marker CEA has been normal.  -Due to her abscess, she would like to postpone her colonoscopy  2. Diarrhea and low grade fever, history of Pelvic abscess -She unfortunately developed a recurrent pelvic abscess after her surgery,  managed by her surgeon Dr. Johney Maine, she is status post ileostomy, draining tube has been removed. -her  recent CT showed no recurrent abscess, her fever resolved after oral antibiotics  -continue IVF and supportive care  3. Microcytic anemia, secondary to GI blood loss and iron deficiency anemia -she responded to IV for him very well. Her hemoglobin dropped to 6.4 when she was in the hospital a months ago, now has improved to 11.3 after 2 doses of IV Feraheme -We'll continue monitoring her iron level   4. HTN -She'll continue follow-up with her primary care physician -Her HTN medication Has Been Held Due To Her Dehydration after Hospitalization, her blood pressure fluctuates, we'll monitor for now.  6.  Anorexia and depression -She tries mirtazapine, did not help. - I encouraged her to take nutrition supplement  7. Dehydration  -She still has intermittent high ileostomy output, requires IV fluids -Continue IV fluids, with NS 1.5 L twice a week for the next 3 weeks, and reassess her in 3 weeks   Plan: -NS  1.5 L every Monday and Thursday for the next 3 weeks -I'll see her back in 3 weeks, sooner if needed    I spent 20 mins for the visit, >50% face to face discussion.   Truitt Merle  04/01/2016

## 2016-04-04 ENCOUNTER — Telehealth: Payer: Self-pay | Admitting: *Deleted

## 2016-04-04 ENCOUNTER — Ambulatory Visit (HOSPITAL_BASED_OUTPATIENT_CLINIC_OR_DEPARTMENT_OTHER): Payer: No Typology Code available for payment source

## 2016-04-04 VITALS — BP 125/56 | HR 61 | Temp 98.2°F | Resp 16

## 2016-04-04 DIAGNOSIS — D638 Anemia in other chronic diseases classified elsewhere: Secondary | ICD-10-CM

## 2016-04-04 DIAGNOSIS — C20 Malignant neoplasm of rectum: Secondary | ICD-10-CM | POA: Diagnosis not present

## 2016-04-04 DIAGNOSIS — E860 Dehydration: Secondary | ICD-10-CM | POA: Diagnosis not present

## 2016-04-04 MED ORDER — HEPARIN SOD (PORK) LOCK FLUSH 100 UNIT/ML IV SOLN
500.0000 [IU] | Freq: Once | INTRAVENOUS | Status: AC | PRN
  Administered 2016-04-04: 500 [IU]
  Filled 2016-04-04: qty 5

## 2016-04-04 MED ORDER — SODIUM CHLORIDE 0.9 % IJ SOLN
10.0000 mL | INTRAMUSCULAR | Status: DC | PRN
  Administered 2016-04-04: 10 mL
  Filled 2016-04-04: qty 10

## 2016-04-04 MED ORDER — SODIUM CHLORIDE 0.9 % IV SOLN
Freq: Once | INTRAVENOUS | Status: AC
  Administered 2016-04-04: 10:00:00 via INTRAVENOUS

## 2016-04-04 NOTE — Patient Instructions (Signed)

## 2016-04-04 NOTE — Telephone Encounter (Signed)
Per LOS I have scheduled appts and notified the scheduler

## 2016-04-08 ENCOUNTER — Telehealth: Payer: Self-pay | Admitting: *Deleted

## 2016-04-08 ENCOUNTER — Telehealth: Payer: Self-pay | Admitting: Medical Oncology

## 2016-04-08 NOTE — Telephone Encounter (Signed)
Lindsay Little,  Could you call her and let her to call Dr. Clyda Greener office for f/u appointment? He wants to see her to discuss colostomy revision. Thanks.   Truitt Merle MD

## 2016-04-08 NOTE — Telephone Encounter (Signed)
Per patient I have added more appts, and notified the MD

## 2016-04-08 NOTE — Telephone Encounter (Signed)
I left a message to pt per Dr Burr Medico . Pt instructed to call Dr Johney Maine office to discuss colostomy revision.

## 2016-04-09 ENCOUNTER — Ambulatory Visit (HOSPITAL_BASED_OUTPATIENT_CLINIC_OR_DEPARTMENT_OTHER): Payer: No Typology Code available for payment source

## 2016-04-09 VITALS — BP 132/89 | HR 79 | Temp 98.7°F | Resp 17

## 2016-04-09 DIAGNOSIS — D638 Anemia in other chronic diseases classified elsewhere: Secondary | ICD-10-CM

## 2016-04-09 DIAGNOSIS — C20 Malignant neoplasm of rectum: Secondary | ICD-10-CM

## 2016-04-09 DIAGNOSIS — E860 Dehydration: Secondary | ICD-10-CM

## 2016-04-09 MED ORDER — SODIUM CHLORIDE 0.9 % IJ SOLN
10.0000 mL | INTRAMUSCULAR | Status: DC | PRN
  Filled 2016-04-09: qty 10

## 2016-04-09 MED ORDER — SODIUM CHLORIDE 0.9 % IV SOLN
Freq: Once | INTRAVENOUS | Status: AC
  Administered 2016-04-09: 15:00:00 via INTRAVENOUS

## 2016-04-09 MED ORDER — HEPARIN SOD (PORK) LOCK FLUSH 100 UNIT/ML IV SOLN
500.0000 [IU] | Freq: Once | INTRAVENOUS | Status: AC | PRN
  Administered 2016-04-09: 500 [IU]
  Filled 2016-04-09: qty 5

## 2016-04-09 NOTE — Patient Instructions (Signed)

## 2016-04-11 ENCOUNTER — Ambulatory Visit (HOSPITAL_BASED_OUTPATIENT_CLINIC_OR_DEPARTMENT_OTHER): Payer: No Typology Code available for payment source

## 2016-04-11 VITALS — BP 126/79 | HR 63 | Temp 98.9°F | Resp 16

## 2016-04-11 DIAGNOSIS — E860 Dehydration: Secondary | ICD-10-CM | POA: Diagnosis not present

## 2016-04-11 DIAGNOSIS — D638 Anemia in other chronic diseases classified elsewhere: Secondary | ICD-10-CM

## 2016-04-11 DIAGNOSIS — C20 Malignant neoplasm of rectum: Secondary | ICD-10-CM

## 2016-04-11 MED ORDER — SODIUM CHLORIDE 0.9 % IJ SOLN
10.0000 mL | INTRAMUSCULAR | Status: DC | PRN
  Administered 2016-04-11: 10 mL
  Filled 2016-04-11: qty 10

## 2016-04-11 MED ORDER — HEPARIN SOD (PORK) LOCK FLUSH 100 UNIT/ML IV SOLN
500.0000 [IU] | Freq: Once | INTRAVENOUS | Status: AC | PRN
  Administered 2016-04-11: 500 [IU]
  Filled 2016-04-11: qty 5

## 2016-04-11 MED ORDER — SODIUM CHLORIDE 0.9 % IV SOLN
Freq: Once | INTRAVENOUS | Status: AC
Start: 2016-04-11 — End: 2016-04-11
  Administered 2016-04-11: 09:00:00 via INTRAVENOUS

## 2016-04-11 NOTE — Patient Instructions (Signed)

## 2016-04-15 ENCOUNTER — Ambulatory Visit (HOSPITAL_BASED_OUTPATIENT_CLINIC_OR_DEPARTMENT_OTHER): Payer: No Typology Code available for payment source

## 2016-04-15 ENCOUNTER — Telehealth: Payer: Self-pay | Admitting: Hematology

## 2016-04-15 ENCOUNTER — Ambulatory Visit (HOSPITAL_BASED_OUTPATIENT_CLINIC_OR_DEPARTMENT_OTHER): Payer: No Typology Code available for payment source | Admitting: Hematology

## 2016-04-15 ENCOUNTER — Encounter: Payer: Self-pay | Admitting: Hematology

## 2016-04-15 VITALS — BP 146/58 | HR 67 | Temp 98.7°F | Resp 18 | Ht 70.0 in | Wt 279.5 lb

## 2016-04-15 DIAGNOSIS — C20 Malignant neoplasm of rectum: Secondary | ICD-10-CM

## 2016-04-15 DIAGNOSIS — Z6841 Body Mass Index (BMI) 40.0 and over, adult: Secondary | ICD-10-CM

## 2016-04-15 DIAGNOSIS — E860 Dehydration: Secondary | ICD-10-CM

## 2016-04-15 DIAGNOSIS — I10 Essential (primary) hypertension: Secondary | ICD-10-CM

## 2016-04-15 DIAGNOSIS — D500 Iron deficiency anemia secondary to blood loss (chronic): Secondary | ICD-10-CM

## 2016-04-15 DIAGNOSIS — K629 Disease of anus and rectum, unspecified: Secondary | ICD-10-CM

## 2016-04-15 DIAGNOSIS — R197 Diarrhea, unspecified: Secondary | ICD-10-CM

## 2016-04-15 DIAGNOSIS — D638 Anemia in other chronic diseases classified elsewhere: Secondary | ICD-10-CM

## 2016-04-15 DIAGNOSIS — F329 Major depressive disorder, single episode, unspecified: Secondary | ICD-10-CM

## 2016-04-15 DIAGNOSIS — F32A Depression, unspecified: Secondary | ICD-10-CM

## 2016-04-15 DIAGNOSIS — K922 Gastrointestinal hemorrhage, unspecified: Secondary | ICD-10-CM

## 2016-04-15 LAB — CBC WITH DIFFERENTIAL/PLATELET
BASO%: 0.3 % (ref 0.0–2.0)
Basophils Absolute: 0 10*3/uL (ref 0.0–0.1)
EOS%: 2.2 % (ref 0.0–7.0)
Eosinophils Absolute: 0.2 10*3/uL (ref 0.0–0.5)
HEMATOCRIT: 38.3 % (ref 34.8–46.6)
HEMOGLOBIN: 12.3 g/dL (ref 11.6–15.9)
LYMPH#: 0.9 10*3/uL (ref 0.9–3.3)
LYMPH%: 13.4 % — ABNORMAL LOW (ref 14.0–49.7)
MCH: 28 pg (ref 25.1–34.0)
MCHC: 32.1 g/dL (ref 31.5–36.0)
MCV: 87.2 fL (ref 79.5–101.0)
MONO#: 0.4 10*3/uL (ref 0.1–0.9)
MONO%: 5.7 % (ref 0.0–14.0)
NEUT#: 5.3 10*3/uL (ref 1.5–6.5)
NEUT%: 78.4 % — ABNORMAL HIGH (ref 38.4–76.8)
Platelets: 292 10*3/uL (ref 145–400)
RBC: 4.39 10*6/uL (ref 3.70–5.45)
RDW: 16 % — AB (ref 11.2–14.5)
WBC: 6.7 10*3/uL (ref 3.9–10.3)

## 2016-04-15 LAB — COMPREHENSIVE METABOLIC PANEL
ALK PHOS: 77 U/L (ref 40–150)
ALT: 17 U/L (ref 0–55)
AST: 16 U/L (ref 5–34)
Albumin: 3.5 g/dL (ref 3.5–5.0)
Anion Gap: 9 mEq/L (ref 3–11)
BILIRUBIN TOTAL: 0.45 mg/dL (ref 0.20–1.20)
BUN: 12.3 mg/dL (ref 7.0–26.0)
CO2: 23 mEq/L (ref 22–29)
Calcium: 9.9 mg/dL (ref 8.4–10.4)
Chloride: 109 mEq/L (ref 98–109)
Creatinine: 0.7 mg/dL (ref 0.6–1.1)
EGFR: >90 mL/min/{1.73_m2} (ref >90)
Glucose: 141 mg/dl — ABNORMAL HIGH (ref 70–140)
Potassium: 4.2 mEq/L (ref 3.5–5.1)
SODIUM: 141 meq/L (ref 136–145)
Total Protein: 7.4 g/dL (ref 6.4–8.3)

## 2016-04-15 LAB — MAGNESIUM: MAGNESIUM: 1.9 mg/dL (ref 1.5–2.5)

## 2016-04-15 MED ORDER — SODIUM CHLORIDE 0.9 % IV SOLN
Freq: Once | INTRAVENOUS | Status: AC
  Administered 2016-04-15: 16:00:00 via INTRAVENOUS

## 2016-04-15 MED ORDER — SODIUM CHLORIDE 0.9 % IJ SOLN
10.0000 mL | INTRAMUSCULAR | Status: DC | PRN
  Administered 2016-04-15: 10 mL
  Filled 2016-04-15: qty 10

## 2016-04-15 MED ORDER — HEPARIN SOD (PORK) LOCK FLUSH 100 UNIT/ML IV SOLN
500.0000 [IU] | Freq: Once | INTRAVENOUS | Status: AC | PRN
  Administered 2016-04-15: 500 [IU]
  Filled 2016-04-15: qty 5

## 2016-04-15 NOTE — Telephone Encounter (Signed)
GAVE PATIENT AVS REPORT AND APPOINTMENTS FOR October  °

## 2016-04-15 NOTE — Progress Notes (Signed)
Lindsay Little  Telephone:([phone] Fax:([phone]) [phone]  Clinic Follow Up Note   Patient Care Team: Lindsay Stains, MD as PCP - General (Family Medicine) Lindsay Boston, MD as Consulting Physician (General Surgery) Lindsay Merle, MD as Consulting Physician (Medical Oncology) Lindsay Rudd, MD as Consulting Physician (Radiation Oncology) Lindsay Irani, MD as Consulting Physician (Gastroenterology) Lindsay Bring, MD as Consulting Physician (Urology) 04/15/2016  CHIEF COMPLAINTS:  Follow up rectal cancer   Oncology History   Rectal adenocarcinoma   Staging form: Colon and Rectum, AJCC 7th Edition     Clinical: T3, N2, M0 - Unsigned       Rectal adenocarcinoma s/p LAR resection 05/25/2015   01/13/2015 Initial Diagnosis    Rectal adenocarcinoma      01/13/2015 Procedure    Colonoscopy showed a sensitivity O nonobstructing mass in the rectum and from 12-18 cm proximal to the Annis. The mass was circumferential, measuring about 6 cm in length. EGD was negative.      01/17/2015 Tumor Marker    CEA 1.4, CA-19-9 8, MMR normal       01/18/2015 Procedure    Lower endoscopic ultrasound by Dr. Paulita Little showed a T3 N2 rectal mass.      01/20/2015 Imaging    CT abdomen and pelvis with contrast showed right lateral rectal wall exophytic mass, and 2 ill-defined hepatic hypoenhancing lesions which appears to correspond to the lesion seen on the prior MRI in 2015.      02/05/2015 Imaging    abdomen MRI showed 2 hemangioma, no suspicion for metastatic disease. CT chest was negative      02/20/2015 - 03/30/2015 Radiation Therapy    neoadjuvant RT to rectal cancer       02/20/2015 Concurrent Chemotherapy    capecitabine 2500 mg in the morning and 2000 mg in the evening (868m/m2, bid), on the day of radiation.       05/25/2015 Surgery    Recto-sigmoid segmental resection, margins are negative       05/25/2015 Pathology Results    0.2cm residual invasive adenocarcinoma, G2, negative margins,  12 nodes were negative       06/23/2015 - 06/29/2015 Hospital Admission    Patient was admitted for pelvic abscess, drain placed and she was treated with IV antibiotics, she also received 1 dose of Feraheme for anemia.      09/15/2015 - 09/17/2015 Hospital Admission    Recurrent pelvic collection s/p perc drainage 09/16/2015       HISTORY OF PRESENTING ILLNESS:  Lindsay BERTI89y.o. female is here because of evaluation for management for newly diagnosed rectal adenocarcinoma.   On 09/06/13, she presented to Dr. HStarr SinclairGI] with 4105-monthistory of diarrhea and worsening abdominal pain. She has a history of chronic abdominal pain and had been previously [place] by Dr. HaAmedeo PlentyAbdominal [place]/6/15 noted to have some hypoechoicity in the left hepatic lobe which was confirmed on MRI to be two hemangiomas  in the right hepatic lobe of measuring up to 3.0 cm along with an additional 7 mm probable cyst.   She presented with worsening of diarrhea for 6 month and was reevaluated by Dr. HaAmedeo PlentyShe was seen on 12/06/14 at which time it was decided she would undergo HIDA scan given that the pain was intermittently in the RUQ in addition to upper and lower endoscopy. HIDA scan 12/15/14 was reassuring, but she  was found to have a malignant tumor of the rectum about 12-18 cm proximal to the anus, gastritis, gastric polyps on endoscopies 01/13/15. Biopsies for these three specimens along with the duodenum were collected and resulted on 01/17/15 for invasive adenocarcinoma, chronic gastritis [Lindsay Little negative], fundic gland polyps, and non-specific mild patchy intra-epithelial lymphocytes. Endoscopic [place] 01/18/15 staged the mass at T3N2Mx and she is here today for consideration of neoadjuvant therapy.    Today, she does report abdominal pain but is no different than her baseline [right-sided, upper, lower] but has had worsening  appetite over the last 2 months. She does have diarrhea with bowel movements every 1-2 hours, especially after eating, and has been noticing bleeding with her bowel movement following her endoscopic evaluation. She also feels her energy level has decreased. Otherwise, she denies nausea, chest pain, shortness of breath.    Family history is notable for a maternal uncle who had pancreatic cancer diagnosed in his mid to late 61s that spread to the colon and prostate before he died about 47 years later and a sister with non-melanoma skin cancer. 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 She smoked roughly 20 pack years [1 pack/week x 7 years] in the past and reports occasional alcohol use but denies any prior illicit drug use.  She lost about 7 lbs in the past 2 months  CURRENT THERAPY: supportive care, IVF 2-3 times a week    INTERIM HISTORY Lindsay Little returns for follow-up. She is doing better overall lately. She has better appetite and energy level, able tolerate more activities. She did have rectal discharge again last week, small amount, no fever or chills. Stopped on its own. She also had an episode of watery large amount ostomy output late last week, no fever or chills, it lasted for about 2 days, and resolved yesterday. She has been eating and drinking much better since yesterday. Her weight is stable, no other new complaints. She appears to be in better spirits today.   HISTORY:  Past Medical History:  Diagnosis Date  . Anemia   . Anxiety   . Bulging disc    L5  . Cancer ([place])   . Complication of anesthesia   . 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 Depression   . Fibroids   . GERD (gastroesophageal reflux disease)   . Heart murmur   . History of blood transfusion   . History of chemotherapy   . History of frequent ear infections   . History of frequent urinary tract infections    middle school age  .  History of hiatal hernia   . History of mitral valve prolapse   . Hypercholesteremia   . Hypertension   . IBS (irritable bowel syndrome)   . Mild sleep apnea    does not use CPAP  . Numbness and tingling    great toe bilat left more than right   . Obesity   . Occasional tremors   . Osteoarthritis, knee   . PONV (postoperative nausea and vomiting)   . Rectal bleeding   . S/P radiation therapy 02/20/15-03/30/15   colon/rectal  . Tear of left rotator cuff   . TMJ (dislocation of temporomandibular joint)   . Vertigo   . Vitamin D deficiency   . Wears glasses     SURGICAL HISTORY: Past Surgical History:  Procedure Laterality Date  . [place]  Removed uterus, and tubes  . DILATION AND CURETTAGE OF UTERUS     times 2  . EUS N/A 01/18/2015   Procedure: LOWER ENDOSCOPIC ULTRASOUND (EUS);  Surgeon: Arta Silence, MD;  Location: [facility];  Service: Endoscopy;  Laterality: N/A;  . EXCISION OF SKIN TAG  11/17/2015   Procedure: EXCISION OF SKIN TAG;  Surgeon: Lindsay Boston, MD;  Location: [facility];  Service: General;;  . ILEO LOOP COLOSTOMY CLOSURE N/A 11/17/2015   Procedure: LAPAROSCOPIC DIVERTING LOOP ILEOSTOMY  DRAINAGE OF PELVIC ABSCESS;  Surgeon: Lindsay Boston, MD;  Location: [facility];  Service: General;  Laterality: N/A;  . IMPACTION REMOVAL  11/17/2015   Procedure: DISIMPACTION REMOVAL;  Surgeon: Lindsay Boston, MD;  Location: [facility];  Service: General;;  . JP drain     . LAPAROSCOPIC LYSIS OF ADHESIONS  11/17/2015   Procedure: LAPAROSCOPIC LYSIS OF ADHESIONS;  Surgeon: Lindsay Boston, MD;  Location: [facility];  Service: General;;  . left knee arthroscopy     mult  . PORTACATH PLACEMENT N/A 05/25/2015   Procedure: INSERTION PORT-A-CATH;  Surgeon: Lindsay Boston, MD;  Location: [facility];  Service: General;  Laterality: N/A;  . ROTATOR CUFF REPAIR    . TONSILLECTOMY    . XI ROBOTIC ASSISTED LOWER ANTERIOR RESECTION N/A 05/25/2015   Procedure: XI ROBOTIC ASSISTED LOWER ANTERIOR  RESECTION, , RIGID PROCTOSCOPY, RIGHT OOPHORECTOMY;  Surgeon: Lindsay Boston, MD;  Location: [facility];  Service: General;  Laterality: N/A;    SOCIAL HISTORY: Social History   Social History  . Marital status: Married    Spouse name: N/A  . Number of children: N/A  . Years of education: N/A   Occupational History  . Not on file.   Social History Main Topics  . Smoking status: Former Smoker    Packs/day: 0.50    Years: 7.00    Types: Cigarettes    Quit date: 07/16/1991  . Smokeless tobacco: Never Used     Comment: 1 pack/week intermittently x 7 years  . Alcohol use 0.6 oz/week    1 Shots of liquor per week     Comment: rarely   . Drug use: No  . Sexual activity: Not on file   Other Topics Concern  . Not on file   Social History Narrative   Tobacco Use: Cigarettes - Former Smoker   Alcohol: Yes, very rare, liquor.    No recreational drug use   Occupation: Head CMA @ [employer]   Marital Status: Married    Husband: Roselyn Reef Disabled   Children: 2 adopted kids [place]   Religion: [employer] [place] HISTORY: Family History  Problem Relation Age of Onset  . Coronary artery disease Mother 88  . Hypertension Mother   . Hyperlipidemia Mother   . Diabetes Mellitus I Mother   . Coronary artery disease Father   . Hyperlipidemia Father   . Hypertension Father   . Cancer Sister     skin - non melanoma  . Hyperlipidemia Brother   . Cancer Maternal Uncle 34    pancreatic with mets to colon and prostate  . Cirrhosis Maternal Uncle   . Hypertension Maternal Grandmother   . Diabetes Mellitus I Maternal Grandmother   . Hyperlipidemia Maternal Grandmother   . CVA Maternal Grandmother   . Hypertension Maternal Grandfather   . Coronary artery disease Maternal Grandfather 65  .  Hyperlipidemia Maternal Grandfather   . Coronary artery disease Paternal Grandmother   . Hypertension Paternal Grandmother   . Hyperlipidemia Paternal  Grandmother   . Diabetes Mellitus I Paternal Grandmother   . Hypertension Paternal Grandfather   . Hyperlipidemia Paternal Grandfather   . Coronary artery disease Paternal Grandfather     ALLERGIES:  is allergic to caine-1 [lidocaine]; flagyl [metronidazole]; adhesive [tape]; iron; oxycodone; penicillins; and sulfa antibiotics.  MEDICATIONS:  Current Outpatient Prescriptions  Medication Sig Dispense Refill  . BYSTOLIC 10 MG tablet Take 10 mg by mouth daily. Reported on 01/22/2016  0  . Coenzyme Q10 (COQ-10) 100 MG CAPS Take 100 mg by mouth daily.    . diphenoxylate-atropine (LOMOTIL) 2.5-0.025 MG tablet Take 1 tablet by mouth 4 (four) times daily as needed for diarrhea or loose stools. 30 tablet 1  . DULoxetine (CYMBALTA) 60 MG capsule Take 60 mg by mouth at bedtime.     Marland Kitchen esomeprazole (NEXIUM) 40 MG capsule Take 1 capsule (40 mg total) by mouth 2 (two) times daily before a meal. 60 capsule 5  . HYDROcodone-acetaminophen (NORCO/VICODIN) 5-325 MG tablet Take 1-2 tablets by mouth every 6 (six) hours as needed for moderate pain or severe pain. 60 tablet 0  . hyoscyamine (LEVBID) 0.375 MG 12 hr tablet Take 0.375 mg by mouth daily.     Marland Kitchen lidocaine-prilocaine (EMLA) cream Apply to affected area once (Patient taking differently: Apply 1 application topically daily as needed (For port-a-cath.). ) 30 g 3  . meloxicam (MOBIC) 15 MG tablet Take 15 mg by mouth daily as needed for pain.     . methocarbamol (ROBAXIN) 500 MG tablet Take 2 tablets (1,000 mg total) by mouth every 6 (six) hours as needed for muscle spasms. (Patient taking differently: Take 500-1,000 mg by mouth every 8 (eight) hours as needed for muscle spasms. ) 20 tablet 3  . ondansetron (ZOFRAN) 8 MG tablet Take 1 tablet (8 mg total) by mouth 2 (two) times daily. Start the day after chemo for 2 days. Then take as needed for nausea or vomiting. (Patient taking differently: Take 8 mg by mouth 2 (two) times daily as needed for nausea or vomiting.  Start the day after chemo for 2 days. Then take as needed for nausea or vomiting.) 10 tablet 3  . potassium chloride SA (K-DUR,KLOR-CON) 20 MEQ tablet Take 1 tablet (20 mEq total) by mouth 2 (two) times daily. 6 tablet 0  . promethazine (PHENERGAN) 25 MG tablet Take 25 mg by mouth every 8 (eight) hours as needed for nausea or vomiting.     . psyllium (METAMUCIL) 58.6 % powder Take 1 packet by mouth 2 (two) times daily as needed.     Marland Kitchen rOPINIRole (REQUIP) 0.25 MG tablet TAKE ONE TABLET BY MOUTH 1-3 hours BEFORE bedtime  5  . temazepam (RESTORIL) 15 MG capsule Take 15 mg by mouth at bedtime as needed for sleep (one to three hours before bed).    . traMADol (ULTRAM) 50 MG tablet Take 50 mg by mouth 2 (two) times daily as needed for moderate pain. pain  2  . Vitamin D, Ergocalciferol, (DRISDOL) 50000 UNITS CAPS capsule Take 50,000 Units by mouth 2 (two) times a week.     No current facility-administered medications for this visit.     REVIEW OF SYSTEMS:   Constitutional: Denies fevers, chills or abnormal night sweats Eyes: Denies blurriness of vision, double vision or watery eyes Ears, nose, mouth, throat, and face: Denies mucositis or sore  throat Respiratory: Denies cough, dyspnea or wheezes Cardiovascular: Denies palpitation, chest discomfort or lower extremity swelling Gastrointestinal:(+) abdominal pain  Skin: Denies abnormal skin rashes Lymphatics: Denies new lymphadenopathy or easy bruising Neurological:Denies numbness, tingling or new weaknesses Behavioral/Psych: Mood is stable, no new changes  All other systems were reviewed with the patient and are negative.  PHYSICAL EXAMINATION: ECOG PERFORMANCE STATUS: 2-3  Vitals:   04/15/16 1456  BP: (!) 146/58  Pulse: 67  Resp: 18  Temp: 98.7 F (37.1 C)   Filed Weights   04/15/16 1456  Weight: 279 lb 8 oz (126.8 kg)    GENERAL:alert, no distress and comfortable SKIN: skin color, texture, turgor are normal, no rashes or  significant lesions EYES: normal, conjunctiva are pink and non-injected, sclera clear OROPHARYNX:no exudate, no erythema and lips, buccal mucosa, and tongue normal  NECK: supple, thyroid normal size, non-tender, without nodularity LYMPH:  no palpable lymphadenopathy in the cervical, axillary or inguinal LUNGS: clear to auscultation and percussion with normal breathing effort HEART: regular rate & rhythm and no murmurs and no lower extremity edema ABDOMEN:abdomen soft, normal bowel sounds. No organomegaly,  Multiple surgical incision scar are well healed. (+) Ileostomy back in the right side abdomen  Musculoskeletal:no cyanosis of digits and no clubbing  PSYCH: alert & oriented x 3 with fluent speech NEURO: no focal motor/sensory deficits   LABORATORY DATA:  I have reviewed the data as listed CBC Latest Ref Rng & Units 03/25/2016 03/11/2016 03/04/2016  WBC 3.9 - 10.3 10e3/uL 8.1 11.0(H) 11.2(H)  Hemoglobin 11.6 - 15.9 g/dL 13.7 11.0(L) 12.4  Hematocrit 34.8 - 46.6 % 42.7 34.9 39.1  Platelets 145 - 400 10e3/uL 412(H) 393 496(H)    CMP Latest Ref Rng & Units 03/25/2016 03/11/2016 03/04/2016  Glucose 70 - 140 mg/dl 72 142(H) 139  BUN 7.0 - 26.0 mg/dL 14.4 7.6 5.5(L)  Creatinine 0.6 - 1.1 mg/dL 0.9 0.9 0.8  Sodium 136 - 145 mEq/L 140 142 143  Potassium 3.5 - 5.1 mEq/L 4.3 3.8 3.6  Chloride 101 - 111 mmol/L - - -  CO2 22 - 29 mEq/L 19(L) 25 25  Calcium 8.4 - 10.4 mg/dL 10.7(H) 10.2 10.5(H)  Total Protein 6.4 - 8.3 g/dL 8.9(H) 7.9 8.1  Total Bilirubin 0.20 - 1.20 mg/dL 0.34 0.36 <0.30  Alkaline Phos 40 - 150 U/L 84 85 88  AST 5 - 34 U/L 16 9 8   ALT 0 - 55 U/L 15 <9 <9   Results for EMRI, SAMPLE (MRN [id number]) as of 04/15/2016 14:50  Ref. Range 11/10/2015 10:06 01/19/2016 08:11 03/25/2016 12:39  CEA Latest Units: ng/mL 1.4    CEA Latest Ref Range: 0.0 - 4.7 ng/mL 1.5 1.9 2.2  CEA ([facility]) Latest Ref Range: 0.00 - 5.00 ng/mL   1.67    PATHOLOGY REPORT Diagnosis 05/25/2015 1.  Colon, segmental resection for tumor, recto-sigmoid INVASIVE COLONIC ADENOCARCINOMA (0.2 CM IN GREATEST DIMENSION RESIDUAL TUMOR) THE TUMOR INVADE SUBMUCOSA (0.3 CM IN DEPTH, PT1) MARGINS OF RESECTION ARE NEGATIVE FOR TUMOR TWELVE BENIGN LYMPH NODES (0/12) 2. Colon, resection margin (donut), proximal anastomotic ring BENIGN COLONIC TISSUE, NEGATIVE FOR MALIGANCY 3. Colon, resection margin (donut), distal anastomotic ring BENIGN COLONIC TISSUE, NEGATIVE FOR MALIGNANCY 4. Ovary, right BENIGN SEROUS CYSTADENOMA.  Microscopic Comment 1. COLON AND RECTUM (INCLUDING TRANS-ANAL RESECTION): Specimen: Colon-rectum Procedure: Segmental resection Tumor site: Rectum anterior wall mid to distal third of rectum Specimen integrity: intact Macroscopic intactness of mesorectum: Complete: _ Macroscopic tumor perforation: Negative Invasive tumor: Maximum size:  Histologic type(s): G2 Histologic grade and differentiation: G1: well differentiated/low grade G2: moderately differentiated/low grade G3: poorly differentiated/high grade G4: undifferentiated/high grade Type of polyp in which invasive carcinoma arose: _ Microscopic extension of invasive tumor:Submucosa Lymph-Vascular invasion: Not identified Peri-neural invasion: negative Tumor deposit(s) (discontinuous extramural extension): Negative Resection margins: Proximal margin: Negative Distal margin: Negative Circumferential (radial) (posterior ascending, posterior descending; lateral and posterior mid-rectum; and entire lower 1/3 rectum):Negatvie Mesenteric margin (sigmoid and transverse): Negative Distance closest margin (if all above margins negative): 2.5 cm Treatment effect (neo-adjuvant therapy): Present Additional polyp(s): Negative Non-neoplastic findings: Unremarkable Lymph nodes: number examined 12; number positive: 0 Pathologic Staging: T1, N0, M_ Ancillary studies: Preserved expression of the makor and minor MMR proteins MLH1,  MSH2, MSH6 and PMS2. MLH1:    RADIOGRAPHIC STUDIES: I have personally reviewed the radiological images as listed and agreed with the findings in the report.  Ct Abdomen Pelvis W Contrast 01/20/2015    IMPRESSION: Right lateral rectal wall exophytic mass likely corresponding to the known neoplasm.  Two ill-defined hepatic hypo enhancing lesions which appears to correspond to the lesions seen on the prior MRI. Please note evaluation of the liver is very limited due to streak artifact. If there is clinical concern for new metastatic disease MRI may provide better evaluation.  Small left ovarian cystic lesion corresponding to the lesion reported on the prior pelvic ultrasound. Ultrasound may provide better evaluation of the pelvic structures.   Electronically Signed   By: Anner Crete M.D.   On: 01/20/2015 16:15   Lower endoscopic ultrasound by Dr. Paulita Little 01/18/2015 FINDINGS: Normal digital rectal exam; could not palpate mass. In proximal rectum extending to the distal rectum, from 12 cm to 20 cm from the anal verge, a partial circumferential (50-75% circumferential distally, 75-100% circumferential proximall) mass was seen. Mass was firm, fixed, ulcerated, friable. EUS radial scope was able to traverse the tumor into normal-appearing distal sigmoid colon. Lesion invaded through the wall of the rectum into neighboring connective tissues in multiple regions. There were several round hypoechoic well-defined malignant-appearing lymph nodes in the vicinity of the proximal portion of the tumor. STAGING: T3 N2 Mx via rectal ultrasound ENDOSCOPIC IMPRESSION: As above. Rectosigmoid mass, biopsy-proven adenocarcinoma, with local invasion noted under ultrasound. RECOMMENDATIONS: 1. Watch for potential complications of procedure. 2. Needs CT chest, abdomen, pelvis to complete staging. 3. Based already on rectal ultrasound results, patient would not be candidate for upfront surgery. She will need surgical  and radiation oncology consultations. 4. Will discuss with Dr. Amedeo Plenty.  CT chest 02/07/2015 IMPRESSION: 1. No findings of metastatic disease to the chest.  CT abdomen and pelvis w contrast 02/21/2016 IMPRESSION: 1. Interval removal of surgical drains since 12/19/2015 from the presacral space. Similar size of ill-defined presacral fluid and gas, for which residual abscess cannot be excluded. Similar amount of intraperitoneal edema throughout the upper pelvis with foci of extraluminal gas, possibly related to the presacral process. 2. Diverting right-sided ileostomy, without acute complication. 3. Similar moderate hydroureteronephrosis, likely due to the pelvic process. 4. Possible bladder wall thickening. Correlate with urinalysis. This appearance could be partially due to underdistention. 5. Geographic hepatic steatosis and hemangiomas. Cannot exclude a new right hepatic lobe lesion. Consider nonemergent pre and post contrast outpatient MRI. This could represent a new area of focal steatosis.    ASSESSMENT & PLAN: [name] is a 50 year old female with chronic abdominal pain with rectal mass on colonoscopy found to be invasive rectal adenocarcinoma.   1. T3N2M0, Stage IIIC  rectal adenocarcinoma, ypT1N0 after neoadjuvant chemotherapy and radiation -I previously reviewed her  Surgical pathology result in great detail with patient and her husband. She has had a good response to neoadjuvant chemoradiation, has a small residual tumor on the surgical specimen, no  Lymph node metastasis. -I recommended adjuvant chemotherapy, however she has not been able to take it due to the pelvic abscess after surgery and a prolonged recovery. - We'll continue her surveillance. She had multiple CT of the abdomen and pelvis after surgery, last one in August 2017, which did not  Show evidence of recurrence. Her tumor marker CEA has been normal.  -I encouraged her to see Dr. Johney Maine back, to discuss  ileostomy reversal, she agrees. -She has overall improved lately, we'll change her IV fluids to every week for the next month.  2. Intermittent diarrhea and rectal discharge -She unfortunately developed a recurrent pelvic abscess after her surgery,  managed by her surgeon Dr. Johney Maine, she is status post ileostomy, draining tube has been removed. -her recent CT showed no recurrent abscess, her fever resolved after oral antibiotics  -continue IVF and supportive care, will change to once a week for the next month  3. Microcytic anemia, secondary to GI blood loss and iron deficiency anemia -she responded to IV for him very well. Her hemoglobin dropped to 6.4 when she was in the hospital a months ago, now has improved to 11.3 after 2 doses of IV Feraheme -We'll continue monitoring her iron level   4. HTN -She'll continue follow-up with her primary care physician -Her HTN medication Has Been Held Due To Her Dehydration after Hospitalization, her blood pressure fluctuates, we'll monitor for now.  6.  Anorexia and depression -She tries mirtazapine, did not help. - I encouraged her to take nutrition supplement -04 improved lately  7. Dehydration  -She still has intermittent high ileostomy output, requires IV fluids -Continue IV fluids, with NS 1.5 L once a week for the next 4 weeks, and reassess her in 4 weeks   Plan: -NS  1.5 L every Monday for the next 4 weeks -I'll see her back in 4 weeks, sooner if needed  -She will see Dr. Shane Crutch soon  -repeat lab today    I spent 20 mins for the visit, >50% face to face discussion.   Lindsay Little  04/15/2016

## 2016-04-19 ENCOUNTER — Other Ambulatory Visit: Payer: Self-pay | Admitting: Nurse Practitioner

## 2016-04-19 ENCOUNTER — Other Ambulatory Visit: Payer: Self-pay | Admitting: *Deleted

## 2016-04-22 ENCOUNTER — Ambulatory Visit (HOSPITAL_COMMUNITY)
Admission: RE | Admit: 2016-04-22 | Discharge: 2016-04-22 | Disposition: A | Discharge status: Home / Self Care | Payer: No Typology Code available for payment source | Source: Ambulatory Visit | Attending: Hematology | Admitting: Hematology

## 2016-04-22 DIAGNOSIS — D638 Anemia in other chronic diseases classified elsewhere: Secondary | ICD-10-CM | POA: Insufficient documentation

## 2016-04-22 MED ORDER — SODIUM CHLORIDE 0.9% FLUSH
10.0000 mL | INTRAVENOUS | Status: AC | PRN
  Administered 2016-04-22: 10 mL

## 2016-04-22 MED ORDER — SODIUM CHLORIDE 0.9 % IV SOLN
Freq: Once | INTRAVENOUS | Status: AC
  Administered 2016-04-22: 13:00:00 via INTRAVENOUS

## 2016-04-22 MED ORDER — HEPARIN SOD (PORK) LOCK FLUSH 100 UNIT/ML IV SOLN
500.0000 [IU] | INTRAVENOUS | Status: AC | PRN
  Administered 2016-04-22: 500 [IU]
  Filled 2016-04-22: qty 5

## 2016-04-22 MED ORDER — SODIUM CHLORIDE 0.9 % IV SOLN: Freq: Once | INTRAVENOUS | Status: DC

## 2016-04-22 NOTE — Progress Notes (Signed)
[name], female   DOB: 02/02/1966, 50 y.o.   MRN: [id number]  Provider: Truitt Merle Associated Diagnosis: Dehydration Procedure: Hydration IV Fluids 1.5 liters of normal saline  Patient tolerated well.Port deaccessed with no complications noted. Patient being discharged per MD orders. Pt states and understanding of discharge instructions with no questions. Patient alert and ambulatory at time of discharge.

## 2016-04-22 NOTE — Addendum Note (Signed)
Addended by: Truitt Merle on: 04/22/2016 01:04 PM   Modules accepted: Orders

## 2016-04-22 NOTE — Discharge Instructions (Signed)
Infusion of 1.5 liters Normal Saline via port a cath.

## 2016-04-29 ENCOUNTER — Ambulatory Visit (HOSPITAL_BASED_OUTPATIENT_CLINIC_OR_DEPARTMENT_OTHER): Payer: No Typology Code available for payment source

## 2016-04-29 VITALS — BP 134/79 | HR 64 | Temp 98.8°F | Resp 16

## 2016-04-29 DIAGNOSIS — E860 Dehydration: Secondary | ICD-10-CM

## 2016-04-29 DIAGNOSIS — C20 Malignant neoplasm of rectum: Secondary | ICD-10-CM | POA: Diagnosis not present

## 2016-04-29 DIAGNOSIS — D638 Anemia in other chronic diseases classified elsewhere: Secondary | ICD-10-CM

## 2016-04-29 MED ORDER — HEPARIN SOD (PORK) LOCK FLUSH 100 UNIT/ML IV SOLN
500.0000 [IU] | Freq: Once | INTRAVENOUS | Status: AC | PRN
  Administered 2016-04-29: 500 [IU]
  Filled 2016-04-29: qty 5

## 2016-04-29 MED ORDER — SODIUM CHLORIDE 0.9 % IJ SOLN
10.0000 mL | INTRAMUSCULAR | Status: DC | PRN
  Administered 2016-04-29: 10 mL
  Filled 2016-04-29: qty 10

## 2016-04-29 MED ORDER — SODIUM CHLORIDE 0.9 % IV SOLN
1500.0000 mL | Freq: Once | INTRAVENOUS | Status: AC
  Administered 2016-04-29: 1500 mL via INTRAVENOUS

## 2016-04-29 NOTE — Patient Instructions (Signed)
Dehydration, Adult Dehydration is when you lose more fluids from the body than you take in. Vital organs like the kidneys, brain, and heart cannot function without a proper amount of fluids and salt. Any loss of fluids from the body can cause dehydration.  CAUSES   Vomiting.  Diarrhea.  Excessive sweating.  Excessive urine output.  Fever. SYMPTOMS  Mild dehydration  Thirst.  Dry lips.  Slightly dry mouth. Moderate dehydration  Very dry mouth.  Sunken eyes.  Skin does not bounce back quickly when lightly pinched and released.  Dark urine and decreased urine production.

## 2016-05-02 ENCOUNTER — Other Ambulatory Visit: Payer: Self-pay | Admitting: Surgery

## 2016-05-02 DIAGNOSIS — K9189 Other postprocedural complications and disorders of digestive system: Secondary | ICD-10-CM

## 2016-05-06 ENCOUNTER — Ambulatory Visit (HOSPITAL_BASED_OUTPATIENT_CLINIC_OR_DEPARTMENT_OTHER): Payer: No Typology Code available for payment source

## 2016-05-06 VITALS — BP 133/87 | HR 72 | Temp 98.6°F | Resp 16

## 2016-05-06 DIAGNOSIS — E860 Dehydration: Secondary | ICD-10-CM | POA: Diagnosis not present

## 2016-05-06 DIAGNOSIS — C20 Malignant neoplasm of rectum: Secondary | ICD-10-CM | POA: Diagnosis not present

## 2016-05-06 DIAGNOSIS — D638 Anemia in other chronic diseases classified elsewhere: Secondary | ICD-10-CM

## 2016-05-06 MED ORDER — SODIUM CHLORIDE 0.9 % IJ SOLN
10.0000 mL | INTRAMUSCULAR | Status: DC | PRN
  Administered 2016-05-06: 10 mL
  Filled 2016-05-06: qty 10

## 2016-05-06 MED ORDER — HEPARIN SOD (PORK) LOCK FLUSH 100 UNIT/ML IV SOLN
500.0000 [IU] | Freq: Once | INTRAVENOUS | Status: AC | PRN
  Administered 2016-05-06: 500 [IU]
  Filled 2016-05-06: qty 5

## 2016-05-06 MED ORDER — SODIUM CHLORIDE 0.9 % IV SOLN
Freq: Once | INTRAVENOUS | Status: AC
  Administered 2016-05-06: 14:00:00 via INTRAVENOUS

## 2016-05-06 NOTE — Patient Instructions (Signed)
Dehydration, Adult Dehydration means your body does not have as much fluid or water as it needs. It happens when you take in less fluid than you lose. Your kidneys, brain, and heart will not work properly without the right amount of fluids.  Dehydration can range from mild to severe. It should be treated right away to help prevent it from becoming severe. HOME CARE  Drink enough fluid to keep your pee (urine) clear or pale yellow.  Drink water or fluid slowly by taking small sips. You can also try sucking on ice cubes.  Have food or drinks that contain electrolytes. Examples include bananas and sports drinks.  Take over-the-counter and prescription medicines only as told by your doctor.  Prepare oral rehydration solution (ORS) according to the instructions that came with it. Take sips of ORS every 5 minutes until your pee returns to normal.  If you are throwing up (vomiting) or have watery poop (diarrhea), keep trying to drink water, ORS, or both.  If you have watery poop, avoid:  Drinks with caffeine.  Fruit juice.  Milk.  Carbonated soft drinks.  Do not take salt tablets. This can lead to having too much sodium in your body (hypernatremia). GET HELP IF:  You cannot eat or drink without throwing up.  You have had mild watery poop for longer than 24 hours.  You have a fever. GET HELP RIGHT AWAY IF:   You have very strong thirst.  You have very bad watery poop.  You have not peed in 6-8 hours, or you have peed only a small amount of very dark pee.  You have shriveled skin.  You are dizzy, confused, or both.   This information is not intended to replace advice given to you by your health care provider. Make sure you discuss any questions you have with your health care provider.   Document Released: 04/27/2009 Document Revised: 03/22/2015 Document Reviewed: 11/16/2014 Elsevier Interactive Patient Education [employer].

## 2016-05-08 ENCOUNTER — Ambulatory Visit (HOSPITAL_COMMUNITY)
Admission: RE | Admit: 2016-05-08 | Discharge: 2016-05-08 | Disposition: A | Discharge status: Home / Self Care | Payer: No Typology Code available for payment source | Source: Ambulatory Visit | Attending: Surgery | Admitting: Surgery

## 2016-05-08 DIAGNOSIS — Z933 Colostomy status: Secondary | ICD-10-CM | POA: Diagnosis not present

## 2016-05-08 DIAGNOSIS — K9181 Other intraoperative complications of digestive system: Secondary | ICD-10-CM | POA: Diagnosis present

## 2016-05-08 DIAGNOSIS — Z09 Encounter for follow-up examination after completed treatment for conditions other than malignant neoplasm: Secondary | ICD-10-CM | POA: Diagnosis not present

## 2016-05-08 DIAGNOSIS — Z9889 Other specified postprocedural states: Secondary | ICD-10-CM | POA: Diagnosis not present

## 2016-05-08 DIAGNOSIS — K9189 Other postprocedural complications and disorders of digestive system: Secondary | ICD-10-CM

## 2016-05-13 ENCOUNTER — Other Ambulatory Visit (HOSPITAL_BASED_OUTPATIENT_CLINIC_OR_DEPARTMENT_OTHER): Payer: No Typology Code available for payment source

## 2016-05-13 ENCOUNTER — Ambulatory Visit (HOSPITAL_BASED_OUTPATIENT_CLINIC_OR_DEPARTMENT_OTHER): Payer: No Typology Code available for payment source | Admitting: Hematology

## 2016-05-13 ENCOUNTER — Telehealth: Payer: Self-pay | Admitting: Hematology

## 2016-05-13 ENCOUNTER — Encounter: Payer: Self-pay | Admitting: Hematology

## 2016-05-13 ENCOUNTER — Ambulatory Visit (HOSPITAL_BASED_OUTPATIENT_CLINIC_OR_DEPARTMENT_OTHER): Payer: No Typology Code available for payment source

## 2016-05-13 VITALS — BP 126/108 | HR 73 | Temp 98.0°F | Resp 16 | Ht 70.0 in | Wt 283.9 lb

## 2016-05-13 VITALS — BP 124/69 | HR 84 | Temp 98.5°F | Resp 18

## 2016-05-13 DIAGNOSIS — D638 Anemia in other chronic diseases classified elsewhere: Secondary | ICD-10-CM

## 2016-05-13 DIAGNOSIS — K922 Gastrointestinal hemorrhage, unspecified: Secondary | ICD-10-CM | POA: Diagnosis not present

## 2016-05-13 DIAGNOSIS — C20 Malignant neoplasm of rectum: Secondary | ICD-10-CM | POA: Diagnosis not present

## 2016-05-13 DIAGNOSIS — R197 Diarrhea, unspecified: Secondary | ICD-10-CM

## 2016-05-13 DIAGNOSIS — Z6841 Body Mass Index (BMI) 40.0 and over, adult: Secondary | ICD-10-CM

## 2016-05-13 DIAGNOSIS — E860 Dehydration: Secondary | ICD-10-CM

## 2016-05-13 DIAGNOSIS — K629 Disease of anus and rectum, unspecified: Secondary | ICD-10-CM

## 2016-05-13 DIAGNOSIS — F329 Major depressive disorder, single episode, unspecified: Secondary | ICD-10-CM

## 2016-05-13 DIAGNOSIS — D500 Iron deficiency anemia secondary to blood loss (chronic): Secondary | ICD-10-CM | POA: Diagnosis not present

## 2016-05-13 DIAGNOSIS — R630 Anorexia: Secondary | ICD-10-CM

## 2016-05-13 DIAGNOSIS — K529 Noninfective gastroenteritis and colitis, unspecified: Secondary | ICD-10-CM

## 2016-05-13 DIAGNOSIS — Z23 Encounter for immunization: Secondary | ICD-10-CM

## 2016-05-13 DIAGNOSIS — I10 Essential (primary) hypertension: Secondary | ICD-10-CM

## 2016-05-13 DIAGNOSIS — F32A Depression, unspecified: Secondary | ICD-10-CM

## 2016-05-13 LAB — COMPREHENSIVE METABOLIC PANEL
ALBUMIN: 4 g/dL (ref 3.5–5.0)
ALK PHOS: 107 U/L (ref 40–150)
ALT: 14 U/L (ref 0–55)
AST: 14 U/L (ref 5–34)
Anion Gap: 9 mEq/L (ref 3–11)
BUN: 12.1 mg/dL (ref 7.0–26.0)
CO2: 22 meq/L (ref 22–29)
CREATININE: 0.8 mg/dL (ref 0.6–1.1)
Calcium: 10.5 mg/dL — ABNORMAL HIGH (ref 8.4–10.4)
Chloride: 107 mEq/L (ref 98–109)
EGFR: 85 mL/min/{1.73_m2} — AB (ref >90)
Glucose: 86 mg/dl (ref 70–140)
Potassium: 4.2 mEq/L (ref 3.5–5.1)
SODIUM: 138 meq/L (ref 136–145)
TOTAL PROTEIN: 8.6 g/dL — AB (ref 6.4–8.3)
Total Bilirubin: 0.45 mg/dL (ref 0.20–1.20)

## 2016-05-13 LAB — CBC WITH DIFFERENTIAL/PLATELET
BASO%: 1.1 % (ref 0.0–2.0)
BASOS ABS: 0.1 10*3/uL (ref 0.0–0.1)
EOS ABS: 0.3 10*3/uL (ref 0.0–0.5)
EOS%: 4.7 % (ref 0.0–7.0)
HCT: 44.9 % (ref 34.8–46.6)
HEMOGLOBIN: 14.9 g/dL (ref 11.6–15.9)
LYMPH%: 16 % (ref 14.0–49.7)
MCH: 28.5 pg (ref 25.1–34.0)
MCHC: 33.2 g/dL (ref 31.5–36.0)
MCV: 85.9 fL (ref 79.5–101.0)
MONO#: 0.4 10*3/uL (ref 0.1–0.9)
MONO%: 5.8 % (ref 0.0–14.0)
NEUT#: 4.7 10*3/uL (ref 1.5–6.5)
NEUT%: 72.4 % (ref 38.4–76.8)
NRBC: 0 % (ref 0–0)
RBC: 5.23 10*6/uL (ref 3.70–5.45)
RDW: 14.8 % — AB (ref 11.2–14.5)
WBC: 6.6 10*3/uL (ref 3.9–10.3)
lymph#: 1.1 10*3/uL (ref 0.9–3.3)

## 2016-05-13 LAB — IRON AND TIBC
%SAT: 24 % (ref 21–57)
Iron: 87 ug/dL (ref 41–142)
TIBC: 356 ug/dL (ref 236–444)
UIBC: 269 ug/dL (ref 120–384)

## 2016-05-13 LAB — CEA (IN HOUSE-CHCC): CEA (CHCC-IN HOUSE): 2.07 ng/mL (ref 0.00–5.00)

## 2016-05-13 LAB — MAGNESIUM: MAGNESIUM: 2 mg/dL (ref 1.5–2.5)

## 2016-05-13 MED ORDER — SODIUM CHLORIDE 0.9 % IJ SOLN
10.0000 mL | INTRAMUSCULAR | Status: DC | PRN
  Administered 2016-05-13: 10 mL
  Filled 2016-05-13: qty 10

## 2016-05-13 MED ORDER — DIPHENOXYLATE-ATROPINE 2.5-0.025 MG PO TABS: ORAL_TABLET | ORAL | 1 refills | Status: DC

## 2016-05-13 MED ORDER — SODIUM CHLORIDE 0.9 % IV SOLN
1500.0000 mL | Freq: Once | INTRAVENOUS | Status: AC
  Administered 2016-05-13: 1500 mL via INTRAVENOUS

## 2016-05-13 MED ORDER — INFLUENZA VAC SPLIT QUAD 0.5 ML IM SUSY
0.5000 mL | PREFILLED_SYRINGE | Freq: Once | INTRAMUSCULAR | Status: AC
  Administered 2016-05-13: 0.5 mL via INTRAMUSCULAR
  Filled 2016-05-13: qty 0.5

## 2016-05-13 MED ORDER — LIDOCAINE-PRILOCAINE 2.5-2.5 % EX CREA: TOPICAL_CREAM | CUTANEOUS | 3 refills | Status: DC

## 2016-05-13 MED ORDER — HEPARIN SOD (PORK) LOCK FLUSH 100 UNIT/ML IV SOLN
500.0000 [IU] | Freq: Once | INTRAVENOUS | Status: AC | PRN
  Administered 2016-05-13: 500 [IU]
  Filled 2016-05-13: qty 5

## 2016-05-13 MED ORDER — ONDANSETRON HCL 8 MG PO TABS: 8.0000 mg | ORAL_TABLET | Freq: Two times a day (BID) | ORAL | 2 refills | Status: DC | PRN

## 2016-05-13 NOTE — Progress Notes (Signed)
[place]  Telephone:([phone]) [phone] Fax:(336) [phone]  Clinic Follow Up Note   Patient Care Team: [name], MD as PCP - General (Family Medicine) Michael Boston, MD as Consulting Physician (General Surgery) Truitt Merle, MD as Consulting Physician (Medical Oncology) Kyung Rudd, MD as Consulting Physician (Radiation Oncology) Teena Irani, MD as Consulting Physician (Gastroenterology) Raynelle Bring, MD as Consulting Physician (Urology) 05/13/2016  CHIEF COMPLAINTS:  Follow up rectal cancer   Oncology History   Rectal adenocarcinoma   Staging form: Colon and Rectum, AJCC 7th Edition     Clinical: T3, N2, M0 - Unsigned       Rectal adenocarcinoma s/p LAR resection 05/25/2015   01/13/2015 Initial Diagnosis    Rectal adenocarcinoma      01/13/2015 Procedure    Colonoscopy showed a sensitivity O nonobstructing mass in the rectum and from 12-18 cm proximal to the Annis. The mass was circumferential, measuring about 6 cm in length. EGD was negative.      01/17/2015 Tumor Marker    CEA 1.4, CA-19-9 8, MMR normal       01/18/2015 Procedure    Lower endoscopic ultrasound by Dr. Paulita Fujita showed a T3 N2 rectal mass.      01/20/2015 Imaging    CT abdomen and pelvis with contrast showed right lateral rectal wall exophytic mass, and 2 ill-defined hepatic hypoenhancing lesions which appears to correspond to the lesion seen on the prior MRI in 2015.      02/05/2015 Imaging    abdomen MRI showed 2 hemangioma, no suspicion for metastatic disease. CT chest was negative      02/20/2015 - 03/30/2015 Radiation Therapy    neoadjuvant RT to rectal cancer       02/20/2015 Concurrent Chemotherapy    capecitabine 2500 mg in the morning and 2000 mg in the evening (861m/m2, bid), on the day of radiation.       05/25/2015 Surgery    Recto-sigmoid segmental resection, margins are negative       05/25/2015 Pathology Results    0.2cm residual invasive adenocarcinoma, G2, negative margins,  12 nodes were negative       06/23/2015 - 06/29/2015 Hospital Admission    Patient was admitted for pelvic abscess, drain placed and she was treated with IV antibiotics, she also received 1 dose of Feraheme for anemia.      09/15/2015 - 09/17/2015 Hospital Admission    Recurrent pelvic collection s/p perc drainage 09/16/2015       HISTORY OF PRESENTING ILLNESS:  Lindsay [id number]. female is here because of evaluation for management for newly diagnosed rectal adenocarcinoma.   On 09/06/13, she presented to Dr. HStarr SinclairGI] with 410-monthistory of diarrhea and worsening abdominal pain. She has a history of chronic abdominal pain and had been previously diagnosed [place] by Dr. HaAmedeo PlentyAbdominal [place]/6/15 noted to have some hypoechoicity in the left hepatic lobe which was confirmed on MRI to be two hemangiomas  in the right hepatic lobe of measuring up to 3.0 cm along with an additional 7 mm probable cyst.   She presented with worsening of diarrhea for 6 month and was reevaluated by Dr. HaAmedeo PlentyShe was seen on 12/06/14 at which time it was decided she would undergo HIDA scan given that the pain was intermittently in the RUQ in addition to upper and lower endoscopy. HIDA scan 12/15/14 was reassuring, but she  was found to have a malignant tumor of the rectum about 12-18 cm proximal to the anus, gastritis, gastric polyps on endoscopies 01/13/15. Biopsies for these three specimens along with the duodenum were collected and resulted on 01/17/15 for invasive adenocarcinoma, chronic gastritis [H. Pylori negative], fundic gland polyps, and non-specific mild patchy intra-epithelial lymphocytes. Endoscopic [place] 01/18/15 staged the mass at T3N2Mx and she is here today for consideration of neoadjuvant therapy.    Today, she does report abdominal pain but is no different than her baseline [right-sided, upper, lower] but has had worsening  appetite over the last 2 months. She does have diarrhea with bowel movements every 1-2 hours, especially after eating, and has been noticing bleeding with her bowel movement following her endoscopic evaluation. She also feels her energy level has decreased. Otherwise, she denies nausea, chest pain, shortness of breath.    Family history is notable for a maternal uncle who had pancreatic cancer diagnosed in his mid to late 55s that spread to the colon and prostate before he died about 24 years later and a sister with non-melanoma skin cancer. 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 She smoked roughly 20 pack years [1 pack/week x 7 years] in the past and reports occasional alcohol use but denies any prior illicit drug use.  She lost about 7 lbs in the past 2 months  CURRENT THERAPY: supportive care, IVF once a week    INTERIM HISTORY [name] returns for follow-up. She is doing better overall lately. Her IV fluids has been decreased to once a week, she still has intermittent watery diarrhea, mild rectal discharge in the bleeding, no significant pain or other complaints. Overall her energy and appetite has improved. She recently saw Dr. Johney Maine, and is scheduled to have a colonoscopy in a few weeks, and colostomy reversion in December. She is quite excited about that.   HISTORY:  Past Medical History:  Diagnosis Date  . Anemia   . Anxiety   . Bulging disc    L5  . Cancer ([place])   . Complication of anesthesia   . 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 Depression   . Fibroids   . GERD (gastroesophageal reflux disease)   . Heart murmur   . History of blood transfusion   . History of chemotherapy   . History of frequent ear infections   . History of frequent urinary tract infections    middle school age  . History of hiatal hernia   . History of mitral valve prolapse   . Hypercholesteremia   . Hypertension   . IBS  (irritable bowel syndrome)   . Mild sleep apnea    does not use CPAP  . Numbness and tingling    great toe bilat left more than right   . Obesity   . Occasional tremors   . Osteoarthritis, knee   . PONV (postoperative nausea and vomiting)   . Rectal bleeding   . S/P radiation therapy 02/20/15-03/30/15   colon/rectal  . Tear of left rotator cuff   . TMJ (dislocation of temporomandibular joint)   . Vertigo   . Vitamin D deficiency   . Wears glasses     SURGICAL HISTORY: Past Surgical History:  Procedure Laterality Date  . ABDOMINAL HYSTERECTOMY  1996   Removed uterus, and tubes  . DILATION AND CURETTAGE OF UTERUS     times 2  . EUS N/A  01/18/2015   Procedure: LOWER ENDOSCOPIC ULTRASOUND (EUS);  Surgeon: Arta Silence, MD;  Location: [facility];  Service: Endoscopy;  Laterality: N/A;  . EXCISION OF SKIN TAG  11/17/2015   Procedure: EXCISION OF SKIN TAG;  Surgeon: Michael Boston, MD;  Location: [facility];  Service: General;;  . ILEO LOOP COLOSTOMY CLOSURE N/A 11/17/2015   Procedure: LAPAROSCOPIC DIVERTING LOOP ILEOSTOMY  DRAINAGE OF PELVIC ABSCESS;  Surgeon: Michael Boston, MD;  Location: [facility];  Service: General;  Laterality: N/A;  . IMPACTION REMOVAL  11/17/2015   Procedure: DISIMPACTION REMOVAL;  Surgeon: Michael Boston, MD;  Location: [facility];  Service: General;;  . JP drain     . LAPAROSCOPIC LYSIS OF ADHESIONS  11/17/2015   Procedure: LAPAROSCOPIC LYSIS OF ADHESIONS;  Surgeon: Michael Boston, MD;  Location: [facility];  Service: General;;  . left knee arthroscopy     mult  . PORTACATH PLACEMENT N/A 05/25/2015   Procedure: INSERTION PORT-A-CATH;  Surgeon: Michael Boston, MD;  Location: [facility];  Service: General;  Laterality: N/A;  . ROTATOR CUFF REPAIR    . TONSILLECTOMY    . XI ROBOTIC ASSISTED LOWER ANTERIOR RESECTION N/A 05/25/2015   Procedure: XI ROBOTIC ASSISTED LOWER ANTERIOR RESECTION, , RIGID PROCTOSCOPY, RIGHT OOPHORECTOMY;  Surgeon: Michael Boston, MD;  Location: [facility];  Service: General;   Laterality: N/A;    SOCIAL HISTORY: Social History   Social History  . Marital status: Married    Spouse name: N/A  . Number of children: N/A  . Years of education: N/A   Occupational History  . Not on file.   Social History Main Topics  . Smoking status: Former Smoker    Packs/day: 0.50    Years: 7.00    Types: Cigarettes    Quit date: 07/16/1991  . Smokeless tobacco: Never Used     Comment: 1 pack/week intermittently x 7 years  . Alcohol use 0.6 oz/week    1 Shots of liquor per week     Comment: rarely   . Drug use: No  . Sexual activity: Not on file   Other Topics Concern  . Not on file   Social History Narrative   Tobacco Use: Cigarettes - Former Smoker   Alcohol: Yes, very rare, liquor.    No recreational drug use   Occupation: Head CMA @ [employer]   Marital Status: Married    Husband: Roselyn Reef Disabled   Children: 2 adopted kids [place]   Religion: [employer] [place] HISTORY: Family History  Problem Relation Age of Onset  . Coronary artery disease Mother 69  . Hypertension Mother   . Hyperlipidemia Mother   . Diabetes Mellitus I Mother   . Coronary artery disease Father   . Hyperlipidemia Father   . Hypertension Father   . Cancer Sister     skin - non melanoma  . Hyperlipidemia Brother   . Cancer Maternal Uncle 30    pancreatic with mets to colon and prostate  . Cirrhosis Maternal Uncle   . Hypertension Maternal Grandmother   . Diabetes Mellitus I Maternal Grandmother   . Hyperlipidemia Maternal Grandmother   . CVA Maternal Grandmother   . Hypertension Maternal Grandfather   . Coronary artery disease Maternal Grandfather 65  . Hyperlipidemia Maternal Grandfather   . Coronary artery disease Paternal Grandmother   . Hypertension Paternal Grandmother   . Hyperlipidemia  Paternal Grandmother   . Diabetes Mellitus I Paternal Grandmother   . Hypertension Paternal Grandfather   . Hyperlipidemia  Paternal Grandfather   . Coronary artery disease Paternal Grandfather     ALLERGIES:  is allergic to caine-1 [lidocaine]; flagyl [metronidazole]; adhesive [tape]; iron; oxycodone; penicillins; and sulfa antibiotics.  MEDICATIONS:  Current Outpatient Prescriptions  Medication Sig Dispense Refill  . Coenzyme Q10 (COQ-10) 100 MG CAPS Take 100 mg by mouth daily.    . diphenoxylate-atropine (LOMOTIL) 2.5-0.025 MG tablet TAKE ONE TABLET BY MOUTH FOUR TIMES DAILY AS NEEDED FOR DIARRHEA 30 tablet 1  . DULoxetine (CYMBALTA) 60 MG capsule Take 60 mg by mouth at bedtime.     Marland Kitchen esomeprazole (NEXIUM) 40 MG capsule Take 1 capsule (40 mg total) by mouth 2 (two) times daily before a meal. 60 capsule 5  . HYDROcodone-acetaminophen (NORCO/VICODIN) 5-325 MG tablet Take 1-2 tablets by mouth every 6 (six) hours as needed for moderate pain or severe pain. 60 tablet 0  . hyoscyamine (LEVBID) 0.375 MG 12 hr tablet Take 0.375 mg by mouth 2 (two) times daily.     Marland Kitchen lidocaine-prilocaine (EMLA) cream Apply to affected area once before port access 30 g 3  . Melatonin 1 MG TABS Take 9 mg by mouth at bedtime.    . meloxicam (MOBIC) 15 MG tablet Take 15 mg by mouth daily as needed for pain.     . methocarbamol (ROBAXIN) 500 MG tablet Take 2 tablets (1,000 mg total) by mouth every 6 (six) hours as needed for muscle spasms. (Patient taking differently: Take 500-1,000 mg by mouth every 8 (eight) hours as needed for muscle spasms. ) 20 tablet 3  . ondansetron (ZOFRAN) 8 MG tablet Take 1 tablet (8 mg total) by mouth 2 (two) times daily as needed for nausea or vomiting. Start the day after chemo for 2 days. Then take as needed for nausea or vomiting. 30 tablet 2  . potassium chloride SA (K-DUR,KLOR-CON) 20 MEQ tablet Take 1 tablet (20 mEq total) by mouth 2 (two) times daily. 6 tablet 0  . promethazine (PHENERGAN) 25 MG tablet Take 25 mg by mouth every 8 (eight) hours as needed for nausea or vomiting.     . psyllium (METAMUCIL) 58.6  % powder Take 1 packet by mouth 2 (two) times daily as needed.     . traMADol (ULTRAM) 50 MG tablet Take 50 mg by mouth 2 (two) times daily as needed for moderate pain. pain  2  . Vitamin D, Ergocalciferol, (DRISDOL) 50000 UNITS CAPS capsule Take 50,000 Units by mouth 2 (two) times a week.    Marland Kitchen BYSTOLIC 10 MG tablet Take 10 mg by mouth daily. Reported on 01/22/2016  0   No current facility-administered medications for this visit.    Facility-Administered Medications Ordered in Other Visits  Medication Dose Route Frequency Provider Last Rate Last Dose  . 0.9 %  sodium chloride infusion   Intravenous Once Truitt Merle, MD      . 0.9 %  sodium chloride infusion  1,500 mL Intravenous Once Truitt Merle, MD 750 mL/hr at 05/13/16 1423 1,500 mL at 05/13/16 1423  . heparin lock flush 100 unit/mL  500 Units Intracatheter Once PRN Truitt Merle, MD      . sodium chloride 0.9 % injection 10 mL  10 mL Intracatheter PRN Truitt Merle, MD        REVIEW OF SYSTEMS:   Constitutional: Denies fevers, chills or abnormal night sweats Eyes: Denies blurriness of vision,  double vision or watery eyes Ears, nose, mouth, throat, and face: Denies mucositis or sore throat Respiratory: Denies cough, dyspnea or wheezes Cardiovascular: Denies palpitation, chest discomfort or lower extremity swelling Gastrointestinal:(+) abdominal pain  Skin: Denies abnormal skin rashes Lymphatics: Denies new lymphadenopathy or easy bruising Neurological:Denies numbness, tingling or new weaknesses Behavioral/Psych: Mood is stable, no new changes  All other systems were reviewed with the patient and are negative.  PHYSICAL EXAMINATION: ECOG PERFORMANCE STATUS: 2  Vitals:   05/13/16 1305  BP: (!) 126/108  Pulse: 73  Resp: 16  Temp: 98 F (36.7 C)   Filed Weights   05/13/16 1305  Weight: 283 lb 14.4 oz (128.8 kg)    GENERAL:alert, no distress and comfortable SKIN: skin color, texture, turgor are normal, no rashes or significant lesions EYES:  normal, conjunctiva are pink and non-injected, sclera clear OROPHARYNX:no exudate, no erythema and lips, buccal mucosa, and tongue normal  NECK: supple, thyroid normal size, non-tender, without nodularity LYMPH:  no palpable lymphadenopathy in the cervical, axillary or inguinal LUNGS: clear to auscultation and percussion with normal breathing effort HEART: regular rate & rhythm and no murmurs and no lower extremity edema ABDOMEN:abdomen soft, normal bowel sounds. No organomegaly,  Multiple surgical incision scar are well healed. (+) Ileostomy back in the right side abdomen  Musculoskeletal:no cyanosis of digits and no clubbing  PSYCH: alert & oriented x 3 with fluent speech NEURO: no focal motor/sensory deficits   LABORATORY DATA:  I have reviewed the data as listed CBC Latest Ref Rng & Units 05/13/2016 04/15/2016 03/25/2016  WBC 3.9 - 10.3 10e3/uL 6.6 6.7 8.1  Hemoglobin 11.6 - 15.9 g/dL 14.9 12.3 13.7  Hematocrit 34.8 - 46.6 % 44.9 38.3 42.7  Platelets 145 - 400 10e3/uL 276 occ small clumps 292 412(H)    CMP Latest Ref Rng & Units 05/13/2016 04/15/2016 03/25/2016  Glucose 70 - 140 mg/dl 86 141(H) 72  BUN 7.0 - 26.0 mg/dL 12.1 12.3 14.4  Creatinine 0.6 - 1.1 mg/dL 0.8 0.7 0.9  Sodium 136 - 145 mEq/L 138 141 140  Potassium 3.5 - 5.1 mEq/L 4.2 4.2 4.3  Chloride 101 - 111 mmol/L - - -  CO2 22 - 29 mEq/L 22 23 19(L)  Calcium 8.4 - 10.4 mg/dL 10.5(H) 9.9 10.7(H)  Total Protein 6.4 - 8.3 g/dL 8.6(H) 7.4 8.9(H)  Total Bilirubin 0.20 - 1.20 mg/dL 0.45 0.45 0.34  Alkaline Phos 40 - 150 U/L 107 77 84  AST 5 - 34 U/L 14 16 16   ALT 0 - 55 U/L 14 17 15    Results for KAMEAH, RAWL (MRN [id number]) as of 04/15/2016 14:50  Ref. Range 11/10/2015 10:06 01/19/2016 08:11 03/25/2016 12:39  CEA Latest Units: ng/mL 1.4    CEA Latest Ref Range: 0.0 - 4.7 ng/mL 1.5 1.9 2.2  CEA ([facility]) Latest Ref Range: 0.00 - 5.00 ng/mL   1.67    PATHOLOGY REPORT Diagnosis 05/25/2015 1. Colon, segmental  resection for tumor, recto-sigmoid INVASIVE COLONIC ADENOCARCINOMA (0.2 CM IN GREATEST DIMENSION RESIDUAL TUMOR) THE TUMOR INVADE SUBMUCOSA (0.3 CM IN DEPTH, PT1) MARGINS OF RESECTION ARE NEGATIVE FOR TUMOR TWELVE BENIGN LYMPH NODES (0/12) 2. Colon, resection margin (donut), proximal anastomotic ring BENIGN COLONIC TISSUE, NEGATIVE FOR MALIGANCY 3. Colon, resection margin (donut), distal anastomotic ring BENIGN COLONIC TISSUE, NEGATIVE FOR MALIGNANCY 4. Ovary, right BENIGN SEROUS CYSTADENOMA.  Microscopic Comment 1. COLON AND RECTUM (INCLUDING TRANS-ANAL RESECTION): Specimen: Colon-rectum Procedure: Segmental resection Tumor site: Rectum anterior wall mid to distal third of  rectum Specimen integrity: intact Macroscopic intactness of mesorectum: Complete: _ Macroscopic tumor perforation: Negative Invasive tumor: Maximum size: Histologic type(s): G2 Histologic grade and differentiation: G1: well differentiated/low grade G2: moderately differentiated/low grade G3: poorly differentiated/high grade G4: undifferentiated/high grade Type of polyp in which invasive carcinoma arose: _ Microscopic extension of invasive tumor:Submucosa Lymph-Vascular invasion: Not identified Peri-neural invasion: negative Tumor deposit(s) (discontinuous extramural extension): Negative Resection margins: Proximal margin: Negative Distal margin: Negative Circumferential (radial) (posterior ascending, posterior descending; lateral and posterior mid-rectum; and entire lower 1/3 rectum):Negatvie Mesenteric margin (sigmoid and transverse): Negative Distance closest margin (if all above margins negative): 2.5 cm Treatment effect (neo-adjuvant therapy): Present Additional polyp(s): Negative Non-neoplastic findings: Unremarkable Lymph nodes: number examined 12; number positive: 0 Pathologic Staging: T1, N0, M_ Ancillary studies: Preserved expression of the makor and minor MMR proteins MLH1, MSH2, MSH6 and  PMS2. MLH1:    RADIOGRAPHIC STUDIES: I have personally reviewed the radiological images as listed and agreed with the findings in the report.  Ct Abdomen Pelvis W Contrast 01/20/2015    IMPRESSION: Right lateral rectal wall exophytic mass likely corresponding to the known neoplasm.  Two ill-defined hepatic hypo enhancing lesions which appears to correspond to the lesions seen on the prior MRI. Please note evaluation of the liver is very limited due to streak artifact. If there is clinical concern for new metastatic disease MRI may provide better evaluation.  Small left ovarian cystic lesion corresponding to the lesion reported on the prior pelvic ultrasound. Ultrasound may provide better evaluation of the pelvic structures.   Electronically Signed   By: Anner Crete M.D.   On: 01/20/2015 16:15   Lower endoscopic ultrasound by Dr. Paulita Fujita 01/18/2015 FINDINGS: Normal digital rectal exam; could not palpate mass. In proximal rectum extending to the distal rectum, from 12 cm to 20 cm from the anal verge, a partial circumferential (50-75% circumferential distally, 75-100% circumferential proximall) mass was seen. Mass was firm, fixed, ulcerated, friable. EUS radial scope was able to traverse the tumor into normal-appearing distal sigmoid colon. Lesion invaded through the wall of the rectum into neighboring connective tissues in multiple regions. There were several round hypoechoic well-defined malignant-appearing lymph nodes in the vicinity of the proximal portion of the tumor. STAGING: T3 N2 Mx via rectal ultrasound ENDOSCOPIC IMPRESSION: As above. Rectosigmoid mass, biopsy-proven adenocarcinoma, with local invasion noted under ultrasound. RECOMMENDATIONS: 1. Watch for potential complications of procedure. 2. Needs CT chest, abdomen, pelvis to complete staging. 3. Based already on rectal ultrasound results, patient would not be candidate for upfront surgery. She will need surgical and radiation  oncology consultations. 4. Will discuss with Dr. Amedeo Plenty.  CT chest 02/07/2015 IMPRESSION: 1. No findings of metastatic disease to the chest.  CT abdomen and pelvis w contrast 02/21/2016 IMPRESSION: 1. Interval removal of surgical drains since 12/19/2015 from the presacral space. Similar size of ill-defined presacral fluid and gas, for which residual abscess cannot be excluded. Similar amount of intraperitoneal edema throughout the upper pelvis with foci of extraluminal gas, possibly related to the presacral process. 2. Diverting right-sided ileostomy, without acute complication. 3. Similar moderate hydroureteronephrosis, likely due to the pelvic process. 4. Possible bladder wall thickening. Correlate with urinalysis. This appearance could be partially due to underdistention. 5. Geographic hepatic steatosis and hemangiomas. Cannot exclude a new right hepatic lobe lesion. Consider nonemergent pre and post contrast outpatient MRI. This could represent a new area of focal steatosis.    ASSESSMENT & PLAN: Lindsay Little is a 50 year old female with chronic abdominal  pain with rectal mass on colonoscopy found to be invasive rectal adenocarcinoma.   1. T3N2M0, Stage IIIC rectal adenocarcinoma, ypT1N0 after neoadjuvant chemotherapy and radiation -I previously reviewed her  Surgical pathology result in great detail with patient and her husband. She has had a good response to neoadjuvant chemoradiation, has a small residual tumor on the surgical specimen, no  Lymph node metastasis. -I recommended adjuvant chemotherapy, however she has not been able to take it due to the pelvic abscess after surgery and a prolonged recovery. - We'll continue her surveillance. She had multiple CT of the abdomen and pelvis after surgery, last one in August 2017, which did not  Show evidence of recurrence. Her tumor marker CEA has been normal.  -She is planned to have colostomy lotion in December, she will have  colonoscopy in a few weeks -She has overall improved lately, we will continue IV fluids every week for now, she may need IV fluids for a few months after her surgery also. -I reviewed her lab results with her, both CBC and CMP are unremarkable, she is clinically doing better, no clinical concern of cancer recurrence -We'll continue colon cancer surveillance, neck CT scan in 6-8 months   2. Intermittent diarrhea and rectal discharge -She unfortunately developed a recurrent pelvic abscess after her surgery,  managed by her surgeon Dr. Johney Maine, she is status post ileostomy, draining tube has been removed. -her recent CT showed no recurrent abscess, her fever resolved after oral antibiotics  -continue IVF and supportive care  3. Microcytic anemia, secondary to GI blood loss and iron deficiency anemia -she responded to IV for him very well. Her anemia has resolved -We'll continue monitoring her iron level   4. HTN -She'll continue follow-up with her primary care physician -Her HTN medication Has Been Held Due To Her Dehydration after Hospitalization, her blood pressure fluctuates, high today, I recommend her to monitor her blood pressure at home  6.  Anorexia and depression -She tries mirtazapine, did not help. - I encouraged her to take nutrition supplement -Overall improved lately  7. Dehydration  -She still has intermittent high ileostomy output, requires IV fluids -Continue IV fluids, with NS 1.5 L once a week for the next 6 weeks    Plan: -NS  1.5 L every Tuesday for the next 6 weeks -I'll see her back in 6 weeks, sooner if needed  -lab every 3 weeks   -She is going to have colostomy revision in December   I spent 20 mins for the visit, >50% face to face discussion.   Truitt Merle  05/13/2016

## 2016-05-13 NOTE — Telephone Encounter (Signed)
AVS report and appointment schedule given to patient per 05/13/16.

## 2016-05-13 NOTE — Patient Instructions (Signed)

## 2016-05-13 NOTE — Telephone Encounter (Signed)
Message sent to infusion scheduler to add infusion, per 05/13/16 los.

## 2016-05-14 ENCOUNTER — Telehealth: Payer: Self-pay | Admitting: *Deleted

## 2016-05-14 LAB — CEA: CEA: 2.3 ng/mL (ref 0.0–4.7)

## 2016-05-14 NOTE — Telephone Encounter (Signed)
Per the LOS I have scheduled appts and notified the scheduler

## 2016-05-21 ENCOUNTER — Ambulatory Visit (HOSPITAL_BASED_OUTPATIENT_CLINIC_OR_DEPARTMENT_OTHER): Payer: No Typology Code available for payment source

## 2016-05-21 VITALS — BP 133/78 | HR 61 | Temp 99.1°F | Resp 18

## 2016-05-21 DIAGNOSIS — E860 Dehydration: Secondary | ICD-10-CM | POA: Diagnosis not present

## 2016-05-21 DIAGNOSIS — D638 Anemia in other chronic diseases classified elsewhere: Secondary | ICD-10-CM

## 2016-05-21 DIAGNOSIS — C20 Malignant neoplasm of rectum: Secondary | ICD-10-CM | POA: Diagnosis not present

## 2016-05-21 MED ORDER — SODIUM CHLORIDE 0.9 % IJ SOLN
10.0000 mL | INTRAMUSCULAR | Status: DC | PRN
  Administered 2016-05-21: 10 mL
  Filled 2016-05-21: qty 10

## 2016-05-21 MED ORDER — SODIUM CHLORIDE 0.9 % IV SOLN
Freq: Once | INTRAVENOUS | Status: AC
  Administered 2016-05-21: 14:00:00 via INTRAVENOUS

## 2016-05-21 MED ORDER — HEPARIN SOD (PORK) LOCK FLUSH 100 UNIT/ML IV SOLN
500.0000 [IU] | Freq: Once | INTRAVENOUS | Status: AC | PRN
  Administered 2016-05-21: 500 [IU]
  Filled 2016-05-21: qty 5

## 2016-05-21 NOTE — Patient Instructions (Signed)

## 2016-05-23 ENCOUNTER — Telehealth: Payer: Self-pay | Admitting: *Deleted

## 2016-05-23 NOTE — Telephone Encounter (Signed)
Received call from pt stating that she has been losing fluid rapidly over last 24 hours & wants to come in for IVF tomorrow or Sat.  She can be reached at [phone].  Note to Dr Burr Medico.

## 2016-05-23 NOTE — Telephone Encounter (Signed)
Notified pt of appt for tomorrow @ 1 pm for IVF.

## 2016-05-24 ENCOUNTER — Ambulatory Visit (HOSPITAL_BASED_OUTPATIENT_CLINIC_OR_DEPARTMENT_OTHER): Payer: No Typology Code available for payment source

## 2016-05-24 ENCOUNTER — Encounter: Payer: Self-pay | Admitting: Hematology

## 2016-05-24 VITALS — BP 122/73 | HR 72 | Temp 98.2°F | Resp 17

## 2016-05-24 DIAGNOSIS — C20 Malignant neoplasm of rectum: Secondary | ICD-10-CM | POA: Diagnosis not present

## 2016-05-24 DIAGNOSIS — E860 Dehydration: Secondary | ICD-10-CM

## 2016-05-24 DIAGNOSIS — D638 Anemia in other chronic diseases classified elsewhere: Secondary | ICD-10-CM

## 2016-05-24 MED ORDER — SODIUM CHLORIDE 0.9 % IJ SOLN
10.0000 mL | INTRAMUSCULAR | Status: DC | PRN
  Administered 2016-05-24: 10 mL
  Filled 2016-05-24: qty 10

## 2016-05-24 MED ORDER — HEPARIN SOD (PORK) LOCK FLUSH 100 UNIT/ML IV SOLN
500.0000 [IU] | Freq: Once | INTRAVENOUS | Status: AC | PRN
  Administered 2016-05-24: 500 [IU]
  Filled 2016-05-24: qty 5

## 2016-05-24 MED ORDER — SODIUM CHLORIDE 0.9 % IV SOLN
Freq: Once | INTRAVENOUS | Status: AC
  Administered 2016-05-24: 14:00:00 via INTRAVENOUS

## 2016-05-24 NOTE — Patient Instructions (Signed)

## 2016-05-28 ENCOUNTER — Ambulatory Visit (HOSPITAL_BASED_OUTPATIENT_CLINIC_OR_DEPARTMENT_OTHER): Payer: No Typology Code available for payment source

## 2016-05-28 ENCOUNTER — Ambulatory Visit: Payer: Self-pay | Admitting: Surgery

## 2016-05-28 VITALS — BP 113/67 | HR 58 | Temp 96.9°F | Resp 16

## 2016-05-28 DIAGNOSIS — D638 Anemia in other chronic diseases classified elsewhere: Secondary | ICD-10-CM

## 2016-05-28 DIAGNOSIS — E860 Dehydration: Secondary | ICD-10-CM

## 2016-05-28 DIAGNOSIS — C20 Malignant neoplasm of rectum: Secondary | ICD-10-CM

## 2016-05-28 MED ORDER — SODIUM CHLORIDE 0.9 % IV SOLN
Freq: Once | INTRAVENOUS | Status: AC
  Administered 2016-05-28: 16:00:00 via INTRAVENOUS

## 2016-05-28 MED ORDER — HEPARIN SOD (PORK) LOCK FLUSH 100 UNIT/ML IV SOLN
500.0000 [IU] | Freq: Once | INTRAVENOUS | Status: AC | PRN
  Administered 2016-05-28: 500 [IU]
  Filled 2016-05-28: qty 5

## 2016-05-28 MED ORDER — SODIUM CHLORIDE 0.9 % IJ SOLN
10.0000 mL | INTRAMUSCULAR | Status: DC | PRN
  Administered 2016-05-28: 10 mL
  Filled 2016-05-28: qty 10

## 2016-05-28 NOTE — Patient Instructions (Signed)

## 2016-05-28 NOTE — Progress Notes (Signed)
Scheduling pre op-  Please place SURGICAL ORDERS in epic.Marland KitchenMarland KitchenThank you

## 2016-05-30 ENCOUNTER — Ambulatory Visit: Payer: No Typology Code available for payment source | Admitting: Nurse Practitioner

## 2016-05-30 ENCOUNTER — Encounter (HOSPITAL_COMMUNITY)
Admission: RE | Admit: 2016-05-30 | Discharge: 2016-05-30 | Disposition: A | Discharge status: Home / Self Care | Payer: No Typology Code available for payment source | Source: Ambulatory Visit | Attending: Surgery | Admitting: Surgery

## 2016-05-30 ENCOUNTER — Encounter (HOSPITAL_COMMUNITY): Payer: Self-pay

## 2016-05-30 DIAGNOSIS — Z432 Encounter for attention to ileostomy: Secondary | ICD-10-CM

## 2016-05-30 DIAGNOSIS — Z01812 Encounter for preprocedural laboratory examination: Secondary | ICD-10-CM

## 2016-05-30 HISTORY — DX: Presence of other vascular implants and grafts: Z95.828

## 2016-05-30 LAB — CBC
HEMATOCRIT: 39.7 % (ref 36.0–46.0)
HEMOGLOBIN: 13.1 g/dL (ref 12.0–15.0)
MCH: 28.8 pg (ref 26.0–34.0)
MCHC: 33 g/dL (ref 30.0–36.0)
MCV: 87.3 fL (ref 78.0–100.0)
Platelets: 273 10*3/uL (ref 150–400)
RBC: 4.55 MIL/uL (ref 3.87–5.11)
RDW: 14.6 % (ref 11.5–15.5)
WBC: 6.9 10*3/uL (ref 4.0–10.5)

## 2016-05-30 NOTE — Pre-Procedure Instructions (Signed)
EKG 6'17, CXR 5'17 Epic. Labs CBC/d,CMP, CEA 05-13-16 Epic.

## 2016-05-30 NOTE — Anesthesia Preprocedure Evaluation (Addendum)
Anesthesia Evaluation  Patient identified by MRN, date of birth, ID band Patient awake    Reviewed: Allergy & Precautions, H&P , NPO status , Patient's Chart, lab work & pertinent test results  History of Anesthesia Complications (+) PONV  Airway Mallampati: II  TM Distance: >3 FB Neck ROM: Full    Dental no notable dental hx. (+) Teeth Intact, Dental Advisory Given   Pulmonary neg pulmonary ROS, former smoker,    Pulmonary exam normal breath sounds clear to auscultation       Cardiovascular hypertension, Pt. on medications  Rhythm:Regular Rate:Normal     Neuro/Psych negative neurological ROS  negative psych ROS   GI/Hepatic Neg liver ROS, GERD  Medicated and Controlled,  Endo/Other  Morbid obesity  Renal/GU negative Renal ROS  negative genitourinary   Musculoskeletal  (+) Arthritis , Osteoarthritis,    Abdominal   Peds  Hematology negative hematology ROS (+) anemia ,   Anesthesia Other Findings   Reproductive/Obstetrics negative OB ROS                           Anesthesia Physical Anesthesia Plan  ASA: III  Anesthesia Plan: General   Post-op Pain Management:    Induction: Intravenous  Airway Management Planned: Oral ETT  Additional Equipment:   Intra-op Plan:   Post-operative Plan: Extubation in OR  Informed Consent: I have reviewed the patients History and Physical, chart, labs and discussed the procedure including the risks, benefits and alternatives for the proposed anesthesia with the patient or authorized representative who has indicated his/her understanding and acceptance.   Dental advisory given  Plan Discussed with: CRNA  Anesthesia Plan Comments:         Anesthesia Quick Evaluation

## 2016-05-30 NOTE — Patient Instructions (Addendum)
Lindsay Little  05/30/2016   Your procedure is scheduled on: 05-31-16  Report to [facility] Main  Entrance take [facility]  elevators to 3rd floor to  [place] at  [place]  AM.  Call this number if you have problems the morning of surgery [phone]  Cpap use- bring mask/ tubing.  Bowel prep per MD(Drink clear Liquids plentiful day of prep).   CLEAR LIQUID DIET   Foods Allowed                                                                     Foods Excluded  Coffee and tea, regular and decaf                             liquids that you cannot  Plain Jell-O in any flavor                                             see through such as: Fruit ices (not with fruit pulp)                                     milk, soups, orange juice  Iced Popsicles                                    All solid food Carbonated beverages, regular and diet                                    Cranberry, grape and apple juices Sports drinks like Gatorade Lightly seasoned clear broth or consume(fat free) Sugar, honey syrup    Remember: ONLY 1 PERSON MAY GO WITH YOU TO SHORT STAY TO GET  READY MORNING OF YOUR SURGERY.  Do not eat food or drink liquids :After Midnight.     Take these medicines the morning of surgery with A SIP OF WATER: Nexium. DO NOT TAKE ANY DIABETIC MEDICATIONS DAY OF YOUR SURGERY                               You may not have any metal on your body including hair pins and              piercings  Do not wear jewelry, make-up, lotions, powders or perfumes, deodorant             Do not wear nail polish.  Do not shave  48 hours prior to surgery.              Men may shave face and neck.   Do not bring valuables to the hospital. [place]  FOR VALUABLES.  Contacts, dentures or bridgework may not be worn into surgery.  Leave suitcase in the car. After surgery it may be brought to your room.     Patients discharged the day  of surgery will not be allowed to drive home.  Name and phone number of your driver: Abelino Derrick [phone] cell  Special Instructions: N/A              Please read over the following fact sheets you were given: _____________________________________________________________________             [facility] - Preparing for Surgery Before surgery, you can play an important role.  Because skin is not sterile, your skin needs to be as free of germs as possible.  You can reduce the number of germs on your skin by washing with CHG (chlorahexidine gluconate) soap before surgery.  CHG is an antiseptic cleaner which kills germs and bonds with the skin to continue killing germs even after washing. Please DO NOT use if you have an allergy to CHG or antibacterial soaps.  If your skin becomes reddened/irritated stop using the CHG and inform your nurse when you arrive at Short Stay. Do not shave (including legs and underarms) for at least 48 hours prior to the first CHG shower.  You may shave your face/neck. Please follow these instructions carefully:  1.  Shower with CHG Soap the night before surgery and the  morning of Surgery.  2.  If you choose to wash your hair, wash your hair first as usual with your  normal  shampoo.  3.  After you shampoo, rinse your hair and body thoroughly to remove the  shampoo.                           4.  Use CHG as you would any other liquid soap.  You can apply chg directly  to the skin and wash                       Gently with a scrungie or clean washcloth.  5.  Apply the CHG Soap to your body ONLY FROM THE NECK DOWN.   Do not use on face/ open                           Wound or open sores. Avoid contact with eyes, ears mouth and genitals (private parts).                       Wash face,  Genitals (private parts) with your normal soap.             6.  Wash thoroughly, paying special attention to the area where your surgery  will be performed.  7.  Thoroughly rinse your  body with warm water from the neck down.  8.  DO NOT shower/wash with your normal soap after using and rinsing off  the CHG Soap.                9.  Pat yourself dry with a clean towel.            10.  Wear clean pajamas.            11.  Place clean sheets on your bed the night of your first shower and do not  sleep with pets. Day of Surgery :  Do not apply any lotions/deodorants the morning of surgery.  Please wear clean clothes to the hospital/surgery center.  FAILURE TO FOLLOW THESE INSTRUCTIONS MAY RESULT IN THE CANCELLATION OF YOUR SURGERY PATIENT SIGNATURE_________________________________  NURSE SIGNATURE__________________________________  ________________________________________________________________________

## 2016-05-31 ENCOUNTER — Encounter (HOSPITAL_COMMUNITY)
Admission: RE | Disposition: A | Discharge status: Home / Self Care | Payer: Self-pay | Source: Ambulatory Visit | Attending: Surgery

## 2016-05-31 ENCOUNTER — Inpatient Hospital Stay (HOSPITAL_COMMUNITY): Payer: No Typology Code available for payment source | Admitting: Anesthesiology

## 2016-05-31 ENCOUNTER — Inpatient Hospital Stay (HOSPITAL_COMMUNITY)
Admission: RE | Admit: 2016-05-31 | Discharge: 2016-06-03 | DRG: 330 | Disposition: A | Discharge status: Home / Self Care | Payer: No Typology Code available for payment source | Source: Ambulatory Visit | Attending: Surgery | Admitting: Surgery

## 2016-05-31 ENCOUNTER — Encounter (HOSPITAL_COMMUNITY): Payer: Self-pay | Admitting: Certified Registered Nurse Anesthetist

## 2016-05-31 DIAGNOSIS — I10 Essential (primary) hypertension: Secondary | ICD-10-CM | POA: Diagnosis present

## 2016-05-31 DIAGNOSIS — E7800 Pure hypercholesterolemia, unspecified: Secondary | ICD-10-CM | POA: Diagnosis present

## 2016-05-31 DIAGNOSIS — Z87440 Personal history of urinary (tract) infections: Secondary | ICD-10-CM

## 2016-05-31 DIAGNOSIS — Z833 Family history of diabetes mellitus: Secondary | ICD-10-CM | POA: Diagnosis not present

## 2016-05-31 DIAGNOSIS — K660 Peritoneal adhesions (postprocedural) (postinfection): Secondary | ICD-10-CM | POA: Diagnosis present

## 2016-05-31 DIAGNOSIS — Z8249 Family history of ischemic heart disease and other diseases of the circulatory system: Secondary | ICD-10-CM | POA: Diagnosis not present

## 2016-05-31 DIAGNOSIS — K589 Irritable bowel syndrome without diarrhea: Secondary | ICD-10-CM | POA: Diagnosis present

## 2016-05-31 DIAGNOSIS — K5090 Crohn's disease, unspecified, without complications: Secondary | ICD-10-CM | POA: Diagnosis present

## 2016-05-31 DIAGNOSIS — Z6841 Body Mass Index (BMI) 40.0 and over, adult: Secondary | ICD-10-CM

## 2016-05-31 DIAGNOSIS — Z9221 Personal history of antineoplastic chemotherapy: Secondary | ICD-10-CM | POA: Diagnosis not present

## 2016-05-31 DIAGNOSIS — F329 Major depressive disorder, single episode, unspecified: Secondary | ICD-10-CM | POA: Diagnosis present

## 2016-05-31 DIAGNOSIS — F32A Depression, unspecified: Secondary | ICD-10-CM | POA: Diagnosis present

## 2016-05-31 DIAGNOSIS — Z432 Encounter for attention to ileostomy: Secondary | ICD-10-CM | POA: Diagnosis present

## 2016-05-31 DIAGNOSIS — D62 Acute posthemorrhagic anemia: Secondary | ICD-10-CM | POA: Diagnosis present

## 2016-05-31 DIAGNOSIS — Z923 Personal history of irradiation: Secondary | ICD-10-CM | POA: Diagnosis not present

## 2016-05-31 DIAGNOSIS — C20 Malignant neoplasm of rectum: Secondary | ICD-10-CM

## 2016-05-31 DIAGNOSIS — Z85048 Personal history of other malignant neoplasm of rectum, rectosigmoid junction, and anus: Secondary | ICD-10-CM

## 2016-05-31 DIAGNOSIS — E66813 Obesity, class 3: Secondary | ICD-10-CM

## 2016-05-31 DIAGNOSIS — N739 Female pelvic inflammatory disease, unspecified: Secondary | ICD-10-CM | POA: Diagnosis present

## 2016-05-31 DIAGNOSIS — Z823 Family history of stroke: Secondary | ICD-10-CM

## 2016-05-31 DIAGNOSIS — K9189 Other postprocedural complications and disorders of digestive system: Secondary | ICD-10-CM | POA: Diagnosis present

## 2016-05-31 HISTORY — PX: ILEOSTOMY CLOSURE: SHX1784

## 2016-05-31 HISTORY — DX: Other postprocedural complications and disorders of digestive system: K91.89

## 2016-05-31 LAB — HEMOGLOBIN A1C
Hgb A1c MFr Bld: 5.2 % (ref 4.8–5.6)
MEAN PLASMA GLUCOSE: 103 mg/dL

## 2016-05-31 SURGERY — CLOSURE, ILEOSTOMY
Anesthesia: General | Site: Abdomen

## 2016-05-31 MED ORDER — MIDAZOLAM HCL 2 MG/2ML IJ SOLN
INTRAMUSCULAR | Status: AC
  Filled 2016-05-31: qty 2

## 2016-05-31 MED ORDER — CELECOXIB 200 MG PO CAPS
400.0000 mg | ORAL_CAPSULE | ORAL | Status: AC
  Administered 2016-05-31: 400 mg via ORAL
  Filled 2016-05-31: qty 2

## 2016-05-31 MED ORDER — PROPOFOL 10 MG/ML IV BOLUS
INTRAVENOUS | Status: AC
  Filled 2016-05-31: qty 20

## 2016-05-31 MED ORDER — SUGAMMADEX SODIUM 500 MG/5ML IV SOLN
INTRAVENOUS | Status: AC
  Filled 2016-05-31: qty 5

## 2016-05-31 MED ORDER — ACETAMINOPHEN 500 MG PO TABS
1000.0000 mg | ORAL_TABLET | ORAL | Status: AC
  Administered 2016-05-31: 1000 mg via ORAL
  Filled 2016-05-31: qty 2

## 2016-05-31 MED ORDER — ALVIMOPAN 12 MG PO CAPS
12.0000 mg | ORAL_CAPSULE | Freq: Once | ORAL | Status: AC
  Administered 2016-05-31: 12 mg via ORAL
  Filled 2016-05-31: qty 1

## 2016-05-31 MED ORDER — CEFOTETAN DISODIUM-DEXTROSE 2-2.08 GM-% IV SOLR
INTRAVENOUS | Status: AC
  Filled 2016-05-31: qty 50

## 2016-05-31 MED ORDER — SACCHAROMYCES BOULARDII 250 MG PO CAPS
250.0000 mg | ORAL_CAPSULE | Freq: Two times a day (BID) | ORAL | Status: DC
  Administered 2016-05-31 – 2016-06-03 (×7): 250 mg via ORAL
  Filled 2016-05-31 (×6): qty 1

## 2016-05-31 MED ORDER — LORAZEPAM 2 MG/ML IJ SOLN: 0.5000 mg | Freq: Three times a day (TID) | INTRAMUSCULAR | Status: DC | PRN

## 2016-05-31 MED ORDER — PROPOFOL 10 MG/ML IV BOLUS
INTRAVENOUS | Status: DC | PRN
  Administered 2016-05-31: 200 mg via INTRAVENOUS

## 2016-05-31 MED ORDER — SUGAMMADEX SODIUM 200 MG/2ML IV SOLN
INTRAVENOUS | Status: DC | PRN
  Administered 2016-05-31: 300 mg via INTRAVENOUS

## 2016-05-31 MED ORDER — FENTANYL CITRATE (PF) 100 MCG/2ML IJ SOLN
INTRAMUSCULAR | Status: AC
  Filled 2016-05-31: qty 2

## 2016-05-31 MED ORDER — PROCHLORPERAZINE EDISYLATE 5 MG/ML IJ SOLN: 10.0000 mg | Freq: Four times a day (QID) | INTRAMUSCULAR | Status: DC | PRN

## 2016-05-31 MED ORDER — LABETALOL HCL 5 MG/ML IV SOLN
INTRAVENOUS | Status: AC
Start: 2016-05-31 — End: 2016-05-31
  Filled 2016-05-31: qty 4

## 2016-05-31 MED ORDER — SUCCINYLCHOLINE CHLORIDE 20 MG/ML IJ SOLN
INTRAMUSCULAR | Status: DC | PRN
  Administered 2016-05-31: 140 mg via INTRAVENOUS

## 2016-05-31 MED ORDER — ALVIMOPAN 12 MG PO CAPS
12.0000 mg | ORAL_CAPSULE | Freq: Two times a day (BID) | ORAL | Status: DC
  Administered 2016-06-01 – 2016-06-03 (×5): 12 mg via ORAL
  Filled 2016-05-31 (×5): qty 1

## 2016-05-31 MED ORDER — KETAMINE HCL 10 MG/ML IJ SOLN
INTRAMUSCULAR | Status: AC
  Filled 2016-05-31: qty 1

## 2016-05-31 MED ORDER — CLINDAMYCIN PHOSPHATE 900 MG/50ML IV SOLN
900.0000 mg | Freq: Three times a day (TID) | INTRAVENOUS | Status: AC
  Administered 2016-05-31: 900 mg via INTRAVENOUS
  Filled 2016-05-31: qty 50

## 2016-05-31 MED ORDER — SCOPOLAMINE 1 MG/3DAYS TD PT72
MEDICATED_PATCH | TRANSDERMAL | Status: AC
  Filled 2016-05-31: qty 1

## 2016-05-31 MED ORDER — LACTATED RINGERS IV SOLN
INTRAVENOUS | Status: DC | PRN
  Administered 2016-05-31 (×2): via INTRAVENOUS

## 2016-05-31 MED ORDER — MAGIC MOUTHWASH
15.0000 mL | Freq: Four times a day (QID) | ORAL | Status: DC | PRN
  Filled 2016-05-31: qty 15

## 2016-05-31 MED ORDER — HYDROMORPHONE HCL 1 MG/ML IJ SOLN
0.5000 mg | INTRAMUSCULAR | Status: DC | PRN
  Administered 2016-05-31 – 2016-06-01 (×7): 1 mg via INTRAVENOUS
  Administered 2016-06-02: 2 mg via INTRAVENOUS
  Administered 2016-06-02 – 2016-06-03 (×2): 1 mg via INTRAVENOUS
  Filled 2016-05-31 (×2): qty 1
  Filled 2016-05-31: qty 2
  Filled 2016-05-31 (×6): qty 1

## 2016-05-31 MED ORDER — DEXAMETHASONE SODIUM PHOSPHATE 10 MG/ML IJ SOLN
INTRAMUSCULAR | Status: DC | PRN
  Administered 2016-05-31: 10 mg via INTRAVENOUS

## 2016-05-31 MED ORDER — SODIUM CHLORIDE 0.9% FLUSH: 3.0000 mL | INTRAVENOUS | Status: DC | PRN

## 2016-05-31 MED ORDER — LIDOCAINE 2% (20 MG/ML) 5 ML SYRINGE
INTRAMUSCULAR | Status: AC
  Filled 2016-05-31: qty 5

## 2016-05-31 MED ORDER — ONDANSETRON HCL 4 MG/2ML IJ SOLN
INTRAMUSCULAR | Status: AC
  Filled 2016-05-31: qty 2

## 2016-05-31 MED ORDER — SODIUM CHLORIDE 0.9 % IJ SOLN
INTRAMUSCULAR | Status: AC
  Filled 2016-05-31: qty 50

## 2016-05-31 MED ORDER — METOPROLOL TARTRATE 5 MG/5ML IV SOLN: 5.0000 mg | Freq: Four times a day (QID) | INTRAVENOUS | Status: DC | PRN

## 2016-05-31 MED ORDER — BISMUTH SUBSALICYLATE 262 MG/15ML PO SUSP
30.0000 mL | Freq: Three times a day (TID) | ORAL | Status: DC | PRN
  Filled 2016-05-31: qty 118

## 2016-05-31 MED ORDER — 0.9 % SODIUM CHLORIDE (POUR BTL) OPTIME
TOPICAL | Status: DC | PRN
  Administered 2016-05-31: 1000 mL

## 2016-05-31 MED ORDER — VITAMIN C 500 MG PO TABS
500.0000 mg | ORAL_TABLET | Freq: Two times a day (BID) | ORAL | Status: DC
  Administered 2016-05-31 – 2016-06-03 (×7): 500 mg via ORAL
  Filled 2016-05-31 (×7): qty 1

## 2016-05-31 MED ORDER — ALUM & MAG HYDROXIDE-SIMETH 200-200-20 MG/5ML PO SUSP: 30.0000 mL | Freq: Four times a day (QID) | ORAL | Status: DC | PRN

## 2016-05-31 MED ORDER — HYDROMORPHONE HCL 1 MG/ML IJ SOLN
INTRAMUSCULAR | Status: AC
  Filled 2016-05-31: qty 1

## 2016-05-31 MED ORDER — DIPHENHYDRAMINE HCL 50 MG/ML IJ SOLN: 12.5000 mg | Freq: Four times a day (QID) | INTRAMUSCULAR | Status: DC | PRN

## 2016-05-31 MED ORDER — MIDAZOLAM HCL 5 MG/5ML IJ SOLN
INTRAMUSCULAR | Status: DC | PRN
  Administered 2016-05-31: 2 mg via INTRAVENOUS

## 2016-05-31 MED ORDER — ONDANSETRON HCL 4 MG PO TABS: 8.0000 mg | ORAL_TABLET | Freq: Two times a day (BID) | ORAL | Status: DC | PRN

## 2016-05-31 MED ORDER — GABAPENTIN 300 MG PO CAPS
300.0000 mg | ORAL_CAPSULE | ORAL | Status: AC
  Administered 2016-05-31: 300 mg via ORAL
  Filled 2016-05-31: qty 1

## 2016-05-31 MED ORDER — SODIUM CHLORIDE 0.9 % IV SOLN: 250.0000 mL | INTRAVENOUS | Status: DC | PRN

## 2016-05-31 MED ORDER — ACETAMINOPHEN 500 MG PO TABS
1000.0000 mg | ORAL_TABLET | Freq: Three times a day (TID) | ORAL | Status: DC
  Administered 2016-05-31 – 2016-06-02 (×5): 1000 mg via ORAL
  Filled 2016-05-31 (×6): qty 2

## 2016-05-31 MED ORDER — COQ-10 100 MG PO CAPS: 100.0000 mg | ORAL_CAPSULE | Freq: Every day | ORAL | Status: DC

## 2016-05-31 MED ORDER — LACTATED RINGERS IV SOLN
INTRAVENOUS | Status: DC
  Administered 2016-05-31 – 2016-06-02 (×3): via INTRAVENOUS

## 2016-05-31 MED ORDER — DIPHENHYDRAMINE HCL 12.5 MG/5ML PO ELIX: 12.5000 mg | ORAL_SOLUTION | Freq: Four times a day (QID) | ORAL | Status: DC | PRN

## 2016-05-31 MED ORDER — BUPIVACAINE LIPOSOME 1.3 % IJ SUSP
INTRAMUSCULAR | Status: DC | PRN
  Administered 2016-05-31: 20 mL

## 2016-05-31 MED ORDER — HYDROMORPHONE HCL 1 MG/ML IJ SOLN
0.2500 mg | INTRAMUSCULAR | Status: DC | PRN
  Administered 2016-05-31 (×4): 0.5 mg via INTRAVENOUS

## 2016-05-31 MED ORDER — LACTATED RINGERS IV BOLUS (SEPSIS): 1000.0000 mL | Freq: Three times a day (TID) | INTRAVENOUS | Status: AC | PRN

## 2016-05-31 MED ORDER — SUCCINYLCHOLINE CHLORIDE 20 MG/ML IJ SOLN
INTRAMUSCULAR | Status: AC
  Filled 2016-05-31: qty 1

## 2016-05-31 MED ORDER — SODIUM CHLORIDE 0.9 % IJ SOLN
INTRAMUSCULAR | Status: DC | PRN
  Administered 2016-05-31: 10 mL

## 2016-05-31 MED ORDER — SCOPOLAMINE 1 MG/3DAYS TD PT72
MEDICATED_PATCH | TRANSDERMAL | Status: DC | PRN
  Administered 2016-05-31: 1 via TRANSDERMAL

## 2016-05-31 MED ORDER — ENOXAPARIN SODIUM 40 MG/0.4ML ~~LOC~~ SOLN
40.0000 mg | Freq: Once | SUBCUTANEOUS | Status: AC
  Administered 2016-05-31: 40 mg via SUBCUTANEOUS
  Filled 2016-05-31: qty 0.4

## 2016-05-31 MED ORDER — ROCURONIUM BROMIDE 50 MG/5ML IV SOSY
PREFILLED_SYRINGE | INTRAVENOUS | Status: AC
  Filled 2016-05-31: qty 5

## 2016-05-31 MED ORDER — BUPIVACAINE HCL (PF) 0.5 % IJ SOLN
INTRAMUSCULAR | Status: AC
  Filled 2016-05-31: qty 30

## 2016-05-31 MED ORDER — HYDROCODONE-ACETAMINOPHEN 5-325 MG PO TABS: 1.0000 | ORAL_TABLET | ORAL | 0 refills | Status: DC | PRN

## 2016-05-31 MED ORDER — SODIUM CHLORIDE 0.9% FLUSH
3.0000 mL | Freq: Two times a day (BID) | INTRAVENOUS | Status: DC
  Administered 2016-05-31 – 2016-06-01 (×3): 3 mL via INTRAVENOUS

## 2016-05-31 MED ORDER — ENOXAPARIN SODIUM 40 MG/0.4ML ~~LOC~~ SOLN
40.0000 mg | SUBCUTANEOUS | Status: DC
  Administered 2016-06-01 – 2016-06-03 (×3): 40 mg via SUBCUTANEOUS
  Filled 2016-05-31 (×3): qty 0.4

## 2016-05-31 MED ORDER — PHENOL 1.4 % MT LIQD
2.0000 | OROMUCOSAL | Status: DC | PRN
  Filled 2016-05-31: qty 177

## 2016-05-31 MED ORDER — LIP MEDEX EX OINT
1.0000 "application " | TOPICAL_OINTMENT | Freq: Two times a day (BID) | CUTANEOUS | Status: DC
  Administered 2016-05-31 – 2016-06-02 (×4): 1 via TOPICAL
  Filled 2016-05-31 (×2): qty 7

## 2016-05-31 MED ORDER — FENTANYL CITRATE (PF) 100 MCG/2ML IJ SOLN
INTRAMUSCULAR | Status: DC | PRN
  Administered 2016-05-31 (×3): 50 ug via INTRAVENOUS
  Administered 2016-05-31 (×2): 100 ug via INTRAVENOUS

## 2016-05-31 MED ORDER — LIDOCAINE-PRILOCAINE 2.5-2.5 % EX CREA
1.0000 "application " | TOPICAL_CREAM | CUTANEOUS | Status: DC | PRN
  Filled 2016-05-31: qty 5

## 2016-05-31 MED ORDER — HYOSCYAMINE SULFATE ER 0.375 MG PO TB12
0.3750 mg | ORAL_TABLET | Freq: Two times a day (BID) | ORAL | Status: DC | PRN
  Filled 2016-05-31: qty 1

## 2016-05-31 MED ORDER — LABETALOL HCL 5 MG/ML IV SOLN
INTRAVENOUS | Status: DC | PRN
  Administered 2016-05-31 (×4): 2.5 mg via INTRAVENOUS

## 2016-05-31 MED ORDER — METHOCARBAMOL 500 MG PO TABS
500.0000 mg | ORAL_TABLET | Freq: Three times a day (TID) | ORAL | Status: DC | PRN
  Administered 2016-05-31 – 2016-06-01 (×4): 500 mg via ORAL
  Administered 2016-06-02 – 2016-06-03 (×3): 1000 mg via ORAL
  Filled 2016-05-31: qty 2
  Filled 2016-05-31 (×2): qty 1
  Filled 2016-05-31: qty 2
  Filled 2016-05-31: qty 1
  Filled 2016-05-31: qty 2

## 2016-05-31 MED ORDER — MELATONIN 5 MG PO CAPS: 10.0000 mg | ORAL_CAPSULE | Freq: Every day | ORAL | Status: DC

## 2016-05-31 MED ORDER — VITAMIN D (ERGOCALCIFEROL) 1.25 MG (50000 UNIT) PO CAPS
50000.0000 [IU] | ORAL_CAPSULE | ORAL | Status: DC
  Administered 2016-06-01: 50000 [IU] via ORAL
  Filled 2016-05-31: qty 1

## 2016-05-31 MED ORDER — MENTHOL 3 MG MT LOZG
1.0000 | LOZENGE | OROMUCOSAL | Status: DC | PRN
  Administered 2016-05-31: 3 mg via ORAL
  Filled 2016-05-31: qty 9

## 2016-05-31 MED ORDER — MELOXICAM 15 MG PO TABS
15.0000 mg | ORAL_TABLET | Freq: Every day | ORAL | Status: DC
  Administered 2016-05-31 – 2016-06-03 (×4): 15 mg via ORAL
  Filled 2016-05-31 (×4): qty 1

## 2016-05-31 MED ORDER — ONDANSETRON HCL 4 MG/2ML IJ SOLN
INTRAMUSCULAR | Status: DC | PRN
Start: 2016-05-31 — End: 2016-05-31
  Administered 2016-05-31 (×2): 4 mg via INTRAVENOUS

## 2016-05-31 MED ORDER — BOOST / RESOURCE BREEZE PO LIQD
1.0000 | Freq: Three times a day (TID) | ORAL | Status: DC
  Administered 2016-05-31 – 2016-06-01 (×4): 1 via ORAL
  Administered 2016-06-02: 21:00:00 via ORAL

## 2016-05-31 MED ORDER — HYDROCODONE-ACETAMINOPHEN 5-325 MG PO TABS
1.0000 | ORAL_TABLET | ORAL | Status: DC | PRN
  Administered 2016-06-01 (×2): 2 via ORAL
  Administered 2016-06-01: 1 via ORAL
  Administered 2016-06-01 – 2016-06-02 (×6): 2 via ORAL
  Filled 2016-05-31 (×2): qty 2
  Filled 2016-05-31: qty 1
  Filled 2016-05-31 (×6): qty 2

## 2016-05-31 MED ORDER — BUPIVACAINE HCL (PF) 0.5 % IJ SOLN
INTRAMUSCULAR | Status: DC | PRN
  Administered 2016-05-31: 30 mL

## 2016-05-31 MED ORDER — DEXAMETHASONE SODIUM PHOSPHATE 10 MG/ML IJ SOLN
INTRAMUSCULAR | Status: AC
  Filled 2016-05-31: qty 1

## 2016-05-31 MED ORDER — KETAMINE HCL 10 MG/ML IJ SOLN
INTRAMUSCULAR | Status: DC | PRN
  Administered 2016-05-31: 20 mg via INTRAVENOUS

## 2016-05-31 MED ORDER — FENTANYL CITRATE (PF) 250 MCG/5ML IJ SOLN
INTRAMUSCULAR | Status: AC
  Filled 2016-05-31: qty 5

## 2016-05-31 MED ORDER — DEXTROSE 5 % IV SOLN
2.0000 g | INTRAVENOUS | Status: AC
  Administered 2016-05-31: 2 g via INTRAVENOUS

## 2016-05-31 MED ORDER — PANTOPRAZOLE SODIUM 40 MG PO TBEC
80.0000 mg | DELAYED_RELEASE_TABLET | Freq: Every day | ORAL | Status: DC
  Administered 2016-05-31 – 2016-06-03 (×4): 80 mg via ORAL
  Filled 2016-05-31 (×3): qty 2

## 2016-05-31 MED ORDER — DULOXETINE HCL 60 MG PO CPEP
60.0000 mg | ORAL_CAPSULE | Freq: Every day | ORAL | Status: DC
  Administered 2016-05-31 – 2016-06-02 (×3): 60 mg via ORAL
  Filled 2016-05-31 (×3): qty 1

## 2016-05-31 MED ORDER — ROCURONIUM BROMIDE 100 MG/10ML IV SOLN
INTRAVENOUS | Status: DC | PRN
  Administered 2016-05-31: 10 mg via INTRAVENOUS
  Administered 2016-05-31: 50 mg via INTRAVENOUS
  Administered 2016-05-31: 20 mg via INTRAVENOUS

## 2016-05-31 MED ORDER — PROMETHAZINE HCL 25 MG PO TABS: 25.0000 mg | ORAL_TABLET | Freq: Three times a day (TID) | ORAL | Status: DC | PRN

## 2016-05-31 MED ORDER — BUPIVACAINE LIPOSOME 1.3 % IJ SUSP
20.0000 mL | INTRAMUSCULAR | Status: DC
  Filled 2016-05-31: qty 20

## 2016-05-31 SURGICAL SUPPLY — 50 items
BLADE SURG SZ10 CARB STEEL (BLADE) ×2 IMPLANT
CHLORAPREP W/TINT 26ML (MISCELLANEOUS) ×2 IMPLANT
COVER MAYO STAND STRL (DRAPES) IMPLANT
COVER SURGICAL LIGHT HANDLE (MISCELLANEOUS) ×2 IMPLANT
DECANTER SPIKE VIAL GLASS SM (MISCELLANEOUS) ×2 IMPLANT
DRAIN CHANNEL 19F RND (DRAIN) IMPLANT
DRAPE LAPAROSCOPIC ABDOMINAL (DRAPES) ×2 IMPLANT
DRAPE SHEET LG 3/4 BI-LAMINATE (DRAPES) IMPLANT
DRAPE UTILITY XL STRL (DRAPES) IMPLANT
DRAPE WARM FLUID 44X44 (DRAPE) ×2 IMPLANT
DRSG OPSITE POSTOP 4X10 (GAUZE/BANDAGES/DRESSINGS) IMPLANT
DRSG OPSITE POSTOP 4X6 (GAUZE/BANDAGES/DRESSINGS) ×4 IMPLANT
DRSG OPSITE POSTOP 4X8 (GAUZE/BANDAGES/DRESSINGS) IMPLANT
DRSG TEGADERM 2-3/8X2-3/4 SM (GAUZE/BANDAGES/DRESSINGS) ×4 IMPLANT
DRSG TEGADERM 4X4.75 (GAUZE/BANDAGES/DRESSINGS) IMPLANT
ELECT PENCIL ROCKER SW 15FT (MISCELLANEOUS) ×2 IMPLANT
ELECT REM PT RETURN 9FT ADLT (ELECTROSURGICAL) ×2
ELECTRODE REM PT RTRN 9FT ADLT (ELECTROSURGICAL) ×1 IMPLANT
GAUZE SPONGE 4X4 12PLY STRL (GAUZE/BANDAGES/DRESSINGS) IMPLANT
GLOVE ECLIPSE 8.0 STRL XLNG CF (GLOVE) ×4 IMPLANT
GLOVE INDICATOR 8.0 STRL GRN (GLOVE) ×4 IMPLANT
GOWN STRL REUS W/TWL XL LVL3 (GOWN DISPOSABLE) ×6 IMPLANT
HANDLE SUCTION POOLE (INSTRUMENTS) ×1 IMPLANT
KIT BASIN OR (CUSTOM PROCEDURE TRAY) ×2 IMPLANT
LEGGING LITHOTOMY PAIR STRL (DRAPES) ×2 IMPLANT
PACK GENERAL/GYN (CUSTOM PROCEDURE TRAY) ×2 IMPLANT
SPONGE LAP 18X18 X RAY DECT (DISPOSABLE) ×2 IMPLANT
STAPLER 90 3.5 STAND SLIM (STAPLE) ×2
STAPLER 90 3.5 STD SLIM (STAPLE) ×1 IMPLANT
STAPLER PROXIMATE 75MM BLUE (STAPLE) ×2 IMPLANT
STAPLER VISISTAT 35W (STAPLE) ×2 IMPLANT
SUCTION POOLE HANDLE (INSTRUMENTS) ×2
SUT MNCRL AB 4-0 PS2 18 (SUTURE) ×2 IMPLANT
SUT PDS AB 1 CTX 36 (SUTURE) ×4 IMPLANT
SUT PDS AB 1 TP1 96 (SUTURE) IMPLANT
SUT SILK 2 0 (SUTURE) ×2
SUT SILK 2 0 SH CR/8 (SUTURE) ×2 IMPLANT
SUT SILK 2-0 18XBRD TIE 12 (SUTURE) ×1 IMPLANT
SUT SILK 3 0 (SUTURE) ×2
SUT SILK 3 0 SH CR/8 (SUTURE) ×2 IMPLANT
SUT SILK 3-0 18XBRD TIE 12 (SUTURE) ×1 IMPLANT
SUT VIC AB 2-0 SH 27 (SUTURE) ×2
SUT VIC AB 2-0 SH 27X BRD (SUTURE) ×1 IMPLANT
SUT VICRYL 0 UR6 27IN ABS (SUTURE) ×2 IMPLANT
SYR BULB IRRIGATION 50ML (SYRINGE) ×2 IMPLANT
TAPE UMBILICAL COTTON 1/8X30 (MISCELLANEOUS) ×2 IMPLANT
TOWEL OR 17X26 10 PK STRL BLUE (TOWEL DISPOSABLE) ×4 IMPLANT
TOWEL OR NON WOVEN STRL DISP B (DISPOSABLE) ×4 IMPLANT
TRAY FOLEY W/METER SILVER 16FR (SET/KITS/TRAYS/PACK) IMPLANT
YANKAUER SUCT BULB TIP 10FT TU (MISCELLANEOUS) ×2 IMPLANT

## 2016-05-31 NOTE — Anesthesia Postprocedure Evaluation (Signed)
Anesthesia Post Note  Patient: Lindsay Little  Procedure(s) Performed: Procedure(s) (LRB): TAKEDOWN LOOP ILEOSTOMY (N/A)  Patient location during evaluation: PACU Anesthesia Type: General Level of consciousness: awake and alert Pain management: pain level controlled Vital Signs Assessment: post-procedure vital signs reviewed and stable Respiratory status: spontaneous breathing, nonlabored ventilation, respiratory function stable and patient connected to nasal cannula oxygen Cardiovascular status: blood pressure returned to baseline and stable Postop Assessment: no signs of nausea or vomiting Anesthetic complications: no    Last Vitals:  Vitals:   05/31/16 1045 05/31/16 1100  BP: 139/78 (!) 146/77  Pulse: 77 78  Resp: 13 12  Temp: 36.8 C 36.8 C    Last Pain:  Vitals:   05/31/16 1100  TempSrc:   PainSc: 5                  Juan Olthoff,W. EDMOND

## 2016-05-31 NOTE — Interval H&P Note (Signed)
History and Physical Interval Note:  05/31/2016 7:01 AM  Lindsay Little  has presented today for surgery, with the diagnosis of loop ileostomy in place for fecal diversion  The various methods of treatment have been discussed with the patient and family. After consideration of risks, benefits and other options for treatment, the patient has consented to  Procedure(s): TAKEDOWN LOOP ILEOSTOMY EXAM UNDER ANESTHESIA (N/A) as a surgical intervention .  The patient's history has been reviewed, patient examined, no change in status, stable for surgery.  I have reviewed the patient's chart and labs.  Questions were answered to the patient's satisfaction.     Esgar Barnick C.

## 2016-05-31 NOTE — Anesthesia Procedure Notes (Signed)
Procedure Name: Intubation Date/Time: 05/31/2016 7:30 AM Performed by: Maxwell Caul Pre-anesthesia Checklist: Patient identified, Emergency Drugs available, Suction available and Patient being monitored Patient Re-evaluated:Patient Re-evaluated prior to inductionOxygen Delivery Method: Circle system utilized Preoxygenation: Pre-oxygenation with 100% oxygen Intubation Type: IV induction Ventilation: Mask ventilation without difficulty Laryngoscope Size: Mac and 4 Grade View: Grade I Tube type: Oral Tube size: 7.5 mm Number of attempts: 1 Airway Equipment and Method: Stylet and Oral airway Placement Confirmation: ETT inserted through vocal cords under direct vision,  positive ETCO2 and breath sounds checked- equal and bilateral Secured at: 22 cm Tube secured with: Tape Dental Injury: Teeth and Oropharynx as per pre-operative assessment

## 2016-05-31 NOTE — Transfer of Care (Signed)
Immediate Anesthesia Transfer of Care Note  Patient: Lindsay Little  Procedure(s) Performed: Procedure(s): TAKEDOWN LOOP ILEOSTOMY (N/A)  Patient Location: PACU  Anesthesia Type:General  Level of Consciousness:  sedated, patient cooperative and responds to stimulation  Airway & Oxygen Therapy:Patient Spontanous Breathing and Patient connected to face mask oxgen  Post-op Assessment:  Report given to PACU RN and Post -op Vital signs reviewed and stable  Post vital signs:  Reviewed and stable  Last Vitals:  Vitals:   05/31/16 0554  BP: (!) 143/93  Pulse: 63  Resp: 18  Temp: 05.1 C    Complications: No apparent anesthesia complications

## 2016-05-31 NOTE — Op Note (Signed)
05/31/2016  9:45 AM  PATIENT:  Lindsay Little  50 y.o. female  Patient Care Team: [name], MD as PCP - General (Family Medicine) Michael Boston, MD as Consulting Physician (General Surgery) Truitt Merle, MD as Consulting Physician (Medical Oncology) Kyung Rudd, MD as Consulting Physician (Radiation Oncology) Teena Irani, MD as Consulting Physician (Gastroenterology) Raynelle Bring, MD as Consulting Physician (Urology)  PRE-OPERATIVE DIAGNOSIS:  loop ileostomy in place for fecal diversion  POST-OPERATIVE DIAGNOSIS:  loop ileostomy in place for fecal diversion  PROCEDURE:  Procedure(s): TAKEDOWN LOOP ILEOSTOMY  SURGEON:  Surgeon(s): Michael Boston, MD  ASSISTANT: RN   ANESTHESIA:   local and general  EBL:  Total I/O In: 1000 [I.V.:1000] Out: 125 [Urine:100; Blood:25]  Delay start of Pharmacological VTE agent (>24hrs) due to surgical blood loss or risk of bleeding:  no  DRAINS: none   SPECIMEN:  Source of Specimen:  LOOP ILEOSTOMY  DISPOSITION OF SPECIMEN:  PATHOLOGY  COUNTS:  YES  PLAN OF CARE: Admit to inpatient   PATIENT DISPOSITION:  PACU - hemodynamically stable.  INDICATION: Pleasant patient status post low anterior resection for bulky rectal cancer.  Developed collection that turned and abscess with delayed anastomotic leak and fistula.  Eventually required fecal diversion.  Had reoperation with omental pedicle flap.  Biopsies disprove cancer recurrence.  Anastomosis closed and healed.  Colonoscopy two days ago showing omentum granulating at colorectal anastomosis otherwise no evidence of cancer recurrence or other cancers. The patient has recovered from that surgery with the anastomosis well-healed.  It was felt safe to have the loop ileostomy taken down.  I discussed the procedure with the patient:  The anatomy & physiology of the digestive tract was discussed.  The pathophysiology was discussed.  Possibility of remaining with an ostomy permanently was discussed.   I offered ostomy takedown.  Laparoscopic & open techniques were discussed.   Risks such as bleeding, infection, abscess, leak, reoperation, possible re-ostomy, injury to other organs, hernia, heart attack, death, and other risks were discussed.   I noted a good likelihood this will help address the problem.  Goals of post-operative recovery were discussed as well.  We will work to minimize complications.  Questions were answered.  The patient expresses understanding & wishes to proceed with surgery.  OR FINDINGS:   Thick abdominal wall with dense adhesions to the diverting loop ileostomy but no parastomal hernia.  Normal anatomy.  DESCRIPTION:   Informed consent was confirmed.   The patient received IV antibiotics & underwent general anesthesia without any difficulty.  Foley catheter was sterilely placed. SCDs were active during the entire case.  The abdomen was prepped and draped in a sterile fashion.  A surgical timeout confirmed our plan.  I made a biconcave curvilinear incision transversely around the loop ileostomy.  I got into the subcutaneous tissues.  I used careful focused right angle dissection and sharp dissection.  Some focused cautery dissection as well.  That helped to free adhesions to the subcutaneous tisses & fascia.  Very dense adhesions at all levels.  Did encounter bleeding with a perforator at the level of the lateral rectus musculature and the subfascial plane.  Controlled with focused cautery.  Gradually, I was able to enter into the peritoneum focally.  I did a gentle finger sweep.  Gradually came around circumferentially and freed the loop of ileum from remaining adhesions to the abdominal wall.  We were able eviscerate some bowel proximally and distally.    I did a side-to-side stapled anastomosis  using a 75 GIA.  I used a TX90 to staple off the common defect and resect the remaining ileum, most of it that involve the intra-abdominal component given the dense adhesions. . Took  the mesentery with clamps and silk ties.  I closed the mesenteric defect using interrupted silk stitches.  Using the redundant mesentery to help cover and protect the TX staple line..  The anastomosis looked healthy and viable.  Silk stitch placed at the crotch of the anastomosis.  We returned the anastomosis into the abdominal cavity.  Finger sweep circumferentially noted no adhesions.  It rested well.  We changed gloves and instruments.  Irrigated copiously into the wound and fascia.  I reapproximated the fascia transversely using #1 PDS stitches.  I irrigated with the LR into the subcutaneous tissues.   because of her abdominal thickness, did to a layer of interrupted 2-0 Vicryl deep dermal sutures to help close the wound and dead space down.  I reapproximated the skin using 4-0 Monocryl stitch running centrally.  I left the corners open.  I packed the corners with anitbiotic soaked umbilical tape for wicks.  Sterile dressing was applied.  The patient is being extubated to go to recovery room.  I discussed postop care with the patient in the office.  I discussed it in the holding area.  Instructions are written.  I discussed the patient's status to her husband.  Questions were answered.  He expressed understanding & appreciation.  [name], M.D., F.A.C.S. Gastrointestinal and Minimally Invasive Surgery [facility] [address], [place], [place] [address] [phone] Main / Paging

## 2016-05-31 NOTE — H&P (Signed)
Lindsay Little 04/29/2016 11:20 AM Location: [facility] Patient #: [id number] DOB: [birth year] Married / Language: English / Race: White Female   History of Present Illness The patient is a 50 year old female who presents with colorectal cancer. Note for "Colorectal cancer": The patient is a 50 year old female who presents with colorectal cancernote for "Colorectal cancer":  The patient is a 50 year old female who presents with colorectal cancer. The patient returns status post robotically-assisted low anterior resection for clinical T3 N1 rectal cancer and proximal/mid rectum. 05/25/2015. Downstaged [id number] (0/12 LN)  Patient returns feeling better. She struggled with that in a delayed anastomotic leak requiring loop ileal diversion and omental flap mobilization. She still struggles with intermittent explosive diarrhea through the ileostomy and is still depended/desiring the IV fluids done. That seems to keep her from getting into trouble. Using Imodium and Lomotil as needed. However not having explosive episodes as bad as she used to. No renal failure. Potassium and magnesium okay. She is emptying the ileostomy 4 times a day. Not needing to use antidiarrheals as much. Bag is staying on & not falling off. She sort of disappeared on me and transition to follow up with Dr. Burr Medico for IV fluids for the past couple months.  Rectal drainage is gone markedly down. She actually had a month with hardly anything. She did have 1 week and with a little bit of loose stuff that concerned her. No more stool. Usually just a little bit of blood or mucus. No incontinence. No fevers or chills. No severe pain. Not on a pain meds anymore. She is hopeful that she is getting better. Her anemia has resolved. Hemoglobin 12-13. Appetite gradually coming back. Intentionally lost weight. Feeling more optimistic today.  Patient struggled with postoperative diarrhea. Was really  bothersome. It eventually got under better control. Appetite down but improving. Then started to feel worse. CT scan showed evidence of abscess cavity near the anastomosis strongly suspicious for leak. Admitted. IV antibiotics. Percutaneous drain placed 06/24/2015. Suspicious for control of anastomotic leak. Anemic. Got another dose of IV iron. Holding off on post adjuvant therapy given the long recovery period and chemotherapy window closing. She developed a persistent pelvic fluid collection. Anastomotic breakdown. Underwent diverting loop ileostomy 11/17/2015. Lysis of adhesions. Omental flap to help plug up the pelvis and help the anastomotic leaks seal. Biopsy of chronic abscess cavity. Pathology benign. Removal of transgluteal drain and placement of intra-abdominal drain. She had a niece that had gastroenteritis. She bounced back with gastroenterologist. Nausea and vomiting. Dehydrated. Admitted. Hydrated. Rapidly improved. Discharged 48 hours later. Had sent her home with IV fluid therapy to avoid dehydration. It seems safe to wean her off at but she felt or dehydrated without etc. she's been staying on it for the past few months. Monitored by Dr. 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  Irritated acrochordon right lower quadrant panniculus  PROCEDURE:   LAPAROSCOPIC lysis of adhesions 60 minutes DIVERTING LOOP ILEOSTOMY  DRAINAGE OF PELVIC ABSCESS OMENTAL PEDICLE FLAP TO DEEP PELVIS EXAM UNDER ANESTHESIA WITH DISIMPACTION  EXCISION OF SKIN TAG  Surgeon(s): Michael Boston, MD  OR FINDINGS: Anastomotic dehiscence 50% circumference right anterior to left posterior lateral. Large body of stool and pelvis. Pigtail drain easily palpated.  Some friability of abscess wall but no hard nodules suspicious for cancer recurrence. Biopsy also nonetheless. Been seen inflamed loop of  distal ileum adherent and partially walling off  the abscess cavity. Part of serosa wall excised and sent to rule out cancer recurrence.  New pelvic drain comes in transabdominally and exits out the left upper quadrant. Goes down the pelvis. Large omental pedicle flap plugging down the right pelvis and trying to keep small bowel away.  Diverting loop ileostomy and high rub upper quadrant subcostal region. Proximal loop at 12:00. Distal loop inferior at 6:00.  No frank evidence of intraperitoneal carcinomatosis or cancer recurrence aside from fibrotic pelvic abscess wall. Biopsies done as noted above  Diagnosis 1. Soft tissue, biopsy, abdominal abscess cavity FIBROMUSCULAR TISSUE WITH INFLAMMATION AND REACTIVE CHANGES NO EVIDENCE OF MALIGNANCY 2. Soft tissue, biopsy, distal ileal wall SMALL BOWEL WITH CHRONIC INFLAMMATION AND HEMORRHAGE CONGESTION NEGATIVE FOR MALIGNANCY 3. Skin, tag INTRA MUSCULAR LIPOMA Casimer Lanius MD Pathologist, Electronic Signature (Case signed 11/21/2015)       SURGERY: 05/25/2015  POST-OPERATIVE DIAGNOSIS:   Proximal Rectal Cancer  XI ROBOTIC ASSISTED RECTOSIGMOID LOWER ANTERIOR RESECTION Laparoscopic and robotic lysis of adhesions X 60 MINUTES RIGID PROCTOSCOPY RIGHT SALPINGO-OOPHORECTOMY INSERTION PORT-A-CATH in R IJ  SURGEON:   Surgeon(s): Michael Boston, MD Leighton Ruff, MD  OR FINDINGS:  Patient had a bulky tumor in proximal and mid rectum. Most of it focused on the right anterolateral circumference. Still came down to around 9 cm from the anal verge.  I enlarged right ovary was some firm and mainly cystic areas. At least 6 cm in largest dimension. Left side looked atrophic and normal. Right ovary removed.  [employer]Port is a ClearVUE 8 [place]. Reference #[id number] Lot number [id number] It is in the right internal jugular vein. The tip sits at the right atrial/superior vena caval junction  No obvious metastatic disease on visceral  nor parietal peritoneum or liver.  The anastomosis rests 5-6 cm from the anal verge by rigid proctoscopy.    Diagnosis 1. Colon, segmental resection for tumor, recto-sigmoid INVASIVE COLONIC ADENOCARCINOMA (0.2 CM IN GREATEST DIMENSION RESIDUAL TUMOR) THE TUMOR INVADE SUBMUCOSA (0.3 CM IN DEPTH, PT1) MARGINS OF RESECTION ARE NEGATIVE FOR TUMOR TWELVE BENIGN LYMPH NODES (0/12) 2. Colon, resection margin (donut), proximal anastomotic ring BENIGN COLONIC TISSUE, NEGATIVE FOR MALIGANCY 3. Colon, resection margin (donut), distal anastomotic ring BENIGN COLONIC TISSUE, NEGATIVE FOR MALIGNANCY 4. Ovary, right BENIGN SEROUS CYSTADENOMA Microscopic Comment 1. COLON AND RECTUM (INCLUDING TRANS-ANAL RESECTION): Specimen: Colon-rectum Procedure: Segmental resection Tumor site: Rectum anterior wall mid to distal third of rectum Specimen integrity: intact Macroscopic intactness of mesorectum: Complete: _ Macroscopic tumor perforation: Negative Invasive tumor: Maximum size: Histologic type(s): G2 Histologic grade and differentiation: G1: well differentiated/low grade G2: moderately differentiated/low grade G3: poorly differentiated/high grade G4: undifferentiated/high grade Type of polyp in which invasive carcinoma arose: _ 1 of 3 Duplicate copy FINAL for REXANNA, LOUTHAN N [id number]) Microscopic Comment(continued) Microscopic extension of invasive tumor:Submucosa Lymph-Vascular invasion: Not identified Peri-neural invasion: negative Tumor deposit(s) (discontinuous extramural extension): Negative Resection margins: Proximal margin: Negative Distal margin: Negative Circumferential (radial) (posterior ascending, posterior descending; lateral and posterior mid-rectum; and entire lower 1/3 rectum):Negatvie Mesenteric margin (sigmoid and transverse): Negative Distance closest margin (if all above margins negative): 2.5 cm Treatment effect (neo-adjuvant therapy): Present Additional  polyp(s): Negative Non-neoplastic findings: Unremarkable Lymph nodes: number examined 12; number positive: 0 Pathologic Staging: T1, N0, M_ Ancillary studies: Preserved expression of the makor and minor MMR proteins MLH1, MSH2, MSH6 and PMS2. MLH1: Casimer Lanius MD Pathologist, Electronic Signature (Case signed 05/31/2015) Specimen Dreyah Montrose and Clinical Information Specimen(s) Obtained: 1. Colon, segmental resection for tumor, recto-sigmoid 2. Colon,  resection margin (donut), proximal anastomotic ring 3. Colon, resection margin (donut), distal anastomotic ring 4. Ovary, right Specimen Clinical Information 1. proximal rectal cancer [rd] Nattalie Santiesteban 1. Specimen: Rectosigmoid, to include at least portion of distal 1/3 of rectum. Specimen integrity: Intact Specimen length: 38 cm. 11 cm from sigmoid rectal junction to distal margin, and 4 cm from peritoneal reflection to distal margin. Mesorectal intactness: Complete Tumor location: Anterior wall, middle third to distal third of rectum Tumor size: There is a 4 cm in length and 2.2 cm in diameter area of healing ulceration with underlying indurated tan white dense tissue up to 1.1 cm thick. A discrete mass is not identified. Percent of bowel circumference involved: 40% Tumor distance to margins: Proximal: 28 cm Distal: 6 cm Radial (posterior ascending, posterior descending; lateral and posterior mid-rectum; and entire lower 1/3 rectum): The area of dense indurated tissue is 2.5 cm from the perirectal soft tissue margin. 2 of 3 Duplicate copy FINAL for KENNETHIA, LYNES [phone]) Jaquelynn Wanamaker(continued) Macroscopic extent of tumor invasion: No definite mass to evaluate invasion Total presumed lymph nodes: Found are seventeen possible lymph nodes ranging from 0.4 to 1.3 cm Extramural satellite tumor nodules: None Mucosal polyp(s): None Additional findings: None Block summary: A = proximal margin B = distal margin C-F = sections of ulceration  and underlying indurated tissue G-J = ulceration and underlying indurated tissue, with nearest serosa/peritoneal reflection. K = perirectal soft tissue margin nearest indurated tissue L = four possible nodes, whole M = four possible nodes, whole N = four possible nodes, whole O = four possible node, whole P = one node bisected Total = sixteen blocks. 2. Received in formalin is a 2 cm in diameter and 1 cm thick ring of tan to hyperemic smooth mucosa with underlying tissue with embedded suture material. Representative sections are submitted in one block. 3. Received in formalin is a 1.4 cm in diameter and up to 1 cm thick ring of tan to hyperemic smooth mucosa and underlying tissue with embedded metallic staples. Representative sections submitted in one block. 4. Received in formalin is a 5.1 x 4.4 x 3.4 cm ovary, clinically right, which has a tan pink to dark red exterior with scattered adhesions. There are also portions of attached tissue consistent with fimbria, however definite tube is not identified. On sectioning, there are several serous and mucoid filled cysts which are up to 4 cm in greatest dimension, and have smooth inner surfaces with no areas of excrescences. Tissue adjacent to the largest cyst is focally tan white, firm and dense. Representative sections are submitted in five blocks. (SSW:kh 05-26-15) Report signed out from the following location(s) Technical component and interpretation was performed at [facility], [place], [place] [address]. CLIA #: [id number], 3 of 3   Problem List/Past Medical [name], MD; 04/29/2016 11:38 AM) RECTAL ADENOCARCINOMA (C20) INCISIONAL HERNIA, INCARCERATED (K43.0) URINARY FREQUENCY (R35.0) LARGE BOWEL ANASTOMOTIC LEAK (K91.89) ENCOUNTER FOR PREOPERATIVE EXAMINATION FOR GENERAL SURGICAL PROCEDURE (Z01.818) ILEOSTOMY IN PLACE (Z93.2) HYPOKALEMIA (E87.6) THROMBOSIS DUE TO CENTRAL VENOUS ACCESS  DEVICE, INITIAL ENCOUNTER [id number])  Other Problems [name], MD; 04/29/2016 11:38 AM) Back Pain Diverticulosis Gastroesophageal Reflux Disease General anesthesia - complications High blood pressure Hypercholesterolemia Other disease, cancer, significant illness Rectal Cancer Transfusion history  Past Surgical History [name], MD; 04/29/2016 11:38 AM) Colon Polyp Removal - Colonoscopy Hysterectomy (not due to cancer) - Partial Knee Surgery Left. Oral Surgery Shoulder Surgery Left.  Diagnostic Studies History Revonda Standard  Anjannette Gauger, MD; 04/29/2016 11:38 AM) Colonoscopy >10 years ago Mammogram 1-3 years ago Pap Smear 1-5 years ago  Allergies Davy Pique Bynum, [place]; 04/29/2016 11:20 AM) Lidocaine *CHEMICALS* All of the Caine's except MARCAINE is ok per the pt Penicillin G Benzathine & Proc *PENICILLINS* Sulfabenzamide *CHEMICALS*  Medication History (Sonya Bynum, CMA; 04/29/2016 11:21 AM) Potassium Chloride ER (20MEQ Tablet ER, 2 (two) Tablet Oral two times daily, Taken starting 12/15/2015) Active. Clotrimazole (10MG Lozenge, Mouth/Throat) Active. DULoxetine HCl (60MG Capsule DR Part, Oral) Active. Esomeprazole Magnesium (40MG Capsule DR, Oral) Active. Furosemide (20MG Tablet, Oral) Active. Hydrochlorothiazide (25MG Tablet, Oral) Active. Hyoscyamine Sulfate ER (0.375MG Tablet ER 12HR, Oral) Active. Meloxicam (15MG Tablet, Oral) Active. Crestor (5MG Tablet, Oral) Active. Bystolic (09BD Tablet, Oral) Active. Coenzyme Q10 (100MG Capsule, Oral) Active. Donnatal (16.2MG Tablet, Oral) Active. Robaxin (500MG Tablet, Oral) Active. Vitamin D (1000UNIT Tablet, Oral) Active. Remeron (15MG Tablet, Oral at bedtime) Active. Medications Reconciled  Pregnancy / Birth History [name], MD; 04/29/2016 11:38 AM) Age at menarche 35 years. Age of menopause 25-50 Gravida 4 Irregular periods Maternal age 63-20 Para 0  Vitals Davy Pique  Bynum CMA; 04/29/2016 11:20 AM) 04/29/2016 11:20 AM Weight: 281 lb Height: 70.5in Body Surface Area: 2.42 m Body Mass Index: 39.75 kg/m  Temp.: 98.14F(Temporal)  Pulse: 75 (Regular)  BP: 126/80 (Sitting, Left Arm, Standard)   BP (!) 143/93 Comment: RN notified  Pulse 63   Temp 97.4 F (36.3 C) (Oral)   Resp 18   Ht 5' 10.5" (1.791 m)   Wt 133.1 kg (293 lb 6 oz)   SpO2 100%   BMI 41.50 kg/m      Physical Exam [name] MD; 04/29/2016 12:05 PM) General Mental Status-Alert. General Appearance-Not in acute distress. Voice-Normal. Note: Relaxed. Nontoxic. Not anxious nor stressed today. Smiling. Moving around more easily   Integumentary Global Assessment Normal Exam - Distribution of scalp and body hair is normal. General Characteristics Overall Skin Surface - no rashes and no suspicious lesions.  Head and Neck Head-normocephalic, atraumatic with no lesions or palpable masses. Face Global Assessment - atraumatic, no absence of expression. Neck Global Assessment - no abnormal movements, no decreased range of motion. Trachea-midline. Thyroid Gland Characteristics - non-tender.  Eye Eyeball - Left-Extraocular movements intact, No Nystagmus. Eyeball - Right-Extraocular movements intact, No Nystagmus. Upper Eyelid - Left-No Cyanotic. Upper Eyelid - Right-No Cyanotic.  Chest and Lung Exam Inspection Accessory muscles - No use of accessory muscles in breathing.  Abdomen Note: Incisions with normal healing ridges. No guarding/rebound tenderness. Really no pain. No hernias. No drainage.  Left lower quadrant abdominal drain with thin output. I removed. She tolerated well.   Female Genitourinary Note: No vaginal bleeding or discharge. Posterior rectovaginal septum intact without any fullness or induration. No obvious fistula. No mass. No ulceration.   Rectal Note: Anterior midline anal tag stable. Sphincter  tone normal. Distal rectum with no mass nor recurrence. Mild fullness posterior midline rectal wall but much softer than before. I feel no fistula or stricture to 8 cm by digital exam. No blood. No stool. No mucus balls.   Peripheral Vascular Upper Extremity Inspection - Left - Not Gangrenous, No Petechiae. Right - Not Gangrenous, No Petechiae.  Neurologic Neurologic evaluation reveals -normal attention span and ability to concentrate, able to name objects and repeat phrases. Appropriate fund of knowledge and normal coordination.  Neuropsychiatric Mental status exam performed with findings of-able to articulate well with normal speech/language, rate, volume and coherence and no evidence of hallucinations,  delusions, obsessions or homicidal/suicidal ideation. Orientation-oriented X3. Note: Less anxious / tearful. Depressed & restless   Musculoskeletal Global Assessment Gait and Station - normal gait and station.  Lymphatic General Lymphatics Description - No Generalized lymphadenopathy.    Assessment & Plan [name] MD; 04/29/2016 12:03 PM) RECTAL ADENOCARCINOMA (C20) Impression: Complicated recovery due to late abscess and contained anastomotic leak with some help of percutaneous drainage and antibiotics.  No evidence of cancer recurrence.  She would benefit from post-adjuvant chemotherapy but obviously this must be delayed until the fistula is closed. Sounds like they're observing only at this point given the prolonged period between surgery and healing.  I think she would benefit from a colonoscopy to make sure she has no cancer recurrence nor any new lesions. It's been a year. Last Dr. Amedeo Plenty to do. She is diverted with her ileostomy so no bowel prep needed. We will have that done after the enema is done. There was no evidence of any retained stool on the last enema done months ago Current Plans Pt Education - [facility] Pelvic Floor Exercises (Kegels) and Dysfunction  HCI (Nakyla Bracco) LARGE BOWEL ANASTOMOTIC LEAK (K91.89) Impression: Given the fact that drainage is markedly gone down without any pain, I'm hopeful that the chronic leak side pocket is closed down.  Would repeat enema. Try and use Gastrografin. That shows no leak, then colonoscopy to rule out any new or recurrent cancers. Of that is negative then proceed with ileostomy takedown.  If she has a persistent leak, then delay another 3 months and repeat. If it does not close down in a year, she may require converting to an end colostomy or a permanent ileostomy. I'm hesitant to try an anastomotic revision but I guess that could be another possibility would wait until at least a 12 year mark since fecal diversion with a loop ileostomy. I don't think she will tolerate an ileostomy indefinitely. Current Plans Plan enema to rule out persistent anastomotic leak.  Plan colonoscopy 1 year screening to rule out cancer recurrence or new cancer.  If both those are reassuring for no leak and no cancer recurrent, then plan Loop ileostomy takedown.  If there is evidence of a persistent leak, then continue current therapy and restudy in 3 - 6 months.  Follow Up - Call [facility] office after tests / studies done to discuss further plans  ILEOSTOMY IN PLACE (Z93.2) Impression: Continue IV fluid infusions with ileostomy at least another month.  Infusion center seems to be working better for her. Try and set up this twice a week at the infusion center 2 L at a time for 6 weeks. In a few weeks, maybe go down to 1L 3 times a week or twice a week. Then eventually wean volume off. Current Plans Pt Education - [facility] Ostomy HCI (Tejon Gracie): discussed with patient and provided information.  I have re-reviewed the the patient's records, history, medications, and allergies.  I have re-examined the patient.  I again discussed intraoperative plans and goals of post-operative recovery.  The patient agrees to proceed. Colonoscopy done.   Granulation at site of omental flap but no other surprises.  No major tumor.  Enema shows no leak or obstruction.  We will proceed with ostomy takedown.  She is ready.   [name], M.D., F.A.C.S. Gastrointestinal and Minimally Invasive Surgery [facility] [address] [place], [place] [address] [phone] Main / Paging

## 2016-05-31 NOTE — Progress Notes (Signed)
PHARMACIST - PHYSICIAN ORDER COMMUNICATION  CONCERNING: P&T Medication Policy on Herbal Medications  DESCRIPTION:  This patient's order for:  Melatonin 39m, Co-Q-10  has been noted.  This product(s) is classified as an "herbal" or natural product. Due to a lack of definitive safety studies or FDA approval, nonstandard manufacturing practices, plus the potential risk of unknown drug-drug interactions while on inpatient medications, the Pharmacy and Therapeutics Committee does not permit the use of "herbal" or natural products of this type [facility]   ACTION TAKEN: The pharmacy department is unable to verify this order at this time and your patient has been informed of this safety policy. Please reevaluate patient's clinical condition at discharge and address if the herbal or natural product(s) should be resumed at that time.

## 2016-05-31 NOTE — Discharge Instructions (Signed)
SURGERY: POST OP INSTRUCTIONS (Surgery for small bowel obstruction, colon resection, etc)   ######################################################################  EAT Gradually transition to a high fiber diet with a fiber supplement over the next few days after discharge  WALK Walk an hour a day.  Control your pain to do that.    CONTROL PAIN Control pain so that you can walk, sleep, tolerate sneezing/coughing, go up/down stairs.  HAVE A BOWEL MOVEMENT DAILY Keep your bowels regular to avoid problems.  OK to try a laxative to override constipation.  OK to use an antidairrheal to slow down diarrhea.  Call if not better after 2 tries  CALL IF YOU HAVE PROBLEMS/CONCERNS Call if you are still struggling despite following these instructions. Call if you have concerns not answered by these instructions  ######################################################################   DIET Follow a light diet the first few days at home.  Start with a bland diet such as soups, liquids, starchy foods, low fat foods, etc.  If you feel full, bloated, or constipated, stay on a ful liquid or pureed/blenderized diet for a few days until you feel better and no longer constipated. Be sure to drink plenty of fluids every day to avoid getting dehydrated (feeling dizzy, not urinating, etc.). Gradually add a fiber supplement to your diet over the next week.  Gradually get back to a regular solid diet.  Avoid fast food or heavy meals the first week as you are more likely to get nauseated. It is expected for your digestive tract to need a few months to get back to normal.  It is common for your bowel movements and stools to be irregular.  You will have occasional bloating and cramping that should eventually fade away.  Until you are eating solid food normally, off all pain medications, and back to regular activities; your bowels will not be normal. Focus on eating a low-fat, high fiber diet the rest of your life  (See Getting to [place], below).  CARE of your INCISION or WOUND It is good for closed incision and even open wounds to be washed every day.  Shower every day.  Short baths are fine.  Wash the incisions and wounds clean with soap & water.    If you have a closed incision(s), wash the incision with soap & water every day.  You may leave closed incisions open to air if it is dry.   You may cover the incision with clean gauze & replace it after your daily shower for comfort. If you have skin tapes (Steristrips) or skin glue (Dermabond) on your incision, leave them in place.  They will fall off on their own like a scab.  You may trim any edges that curl up with clean scissors.  If you have staples, set up an appointment for them to be removed in the office in 10 days after surgery.  If you have a drain, wash around the skin exit site with soap & water and place a new dressing of gauze or band aid around the skin every day.  Keep the drain site clean & dry.    If you have an open wound with packing, see wound care instructions.  In general, it is encouraged that you remove your dressing and packing, shower with soap & water, and replace your dressing once a day.  Pack the wound with clean gauze moistened with normal (0.9%) saline to keep the wound moist & uninfected.  Pressure on the dressing for 30 minutes will stop most wound  bleeding.  Eventually your body will heal & pull the open wound closed over the next few months.  Raw open wounds will occasionally bleed or secrete yellow drainage until it heals closed.  Drain sites will drain a little until the drain is removed.  Even closed incisions can have mild bleeding or drainage the first few days until the skin edges scab over & seal.   If you have an open wound with a wound vac, see wound vac care instructions.     ACTIVITIES as tolerated Start light daily activities --- self-care, walking, climbing stairs-- beginning the day after surgery.   Gradually increase activities as tolerated.  Control your pain to be active.  Stop when you are tired.  Ideally, walk several times a day, eventually an hour a day.   Most people are back to most day-to-day activities in a few weeks.  It takes 4-8 weeks to get back to unrestricted, intense activity. If you can walk 30 minutes without difficulty, it is safe to try more intense activity such as jogging, treadmill, bicycling, low-impact aerobics, swimming, etc. Save the most intensive and strenuous activity for last (Usually 4-8 weeks after surgery) such as sit-ups, heavy lifting, contact sports, etc.  Refrain from any intense heavy lifting or straining until you are off narcotics for pain control.  You will have off days, but things should improve week-by-week. DO NOT PUSH THROUGH PAIN.  Let pain be your guide: If it hurts to do something, don't do it.  Pain is your body warning you to avoid that activity for another week until the pain goes down. You may drive when you are no longer taking narcotic prescription pain medication, you can comfortably wear a seatbelt, and you can safely make sudden turns/stops to protect yourself without hesitating due to pain. You may have sexual intercourse when it is comfortable. If it hurts to do something, stop.  MEDICATIONS Take your usually prescribed home medications unless otherwise directed.   Blood thinners:  Usually you can restart any strong blood thinners after the second postoperative day.  It is OK to take aspirin right away.     If you are on strong blood thinners (warfarin/Coumadin, Plavix, Xerelto, Eliquis, Pradaxa, etc), discuss with your surgeon, medicine PCP, and/or cardiologist for instructions on when to restart the blood thinner & if blood monitoring is needed (PT/INR blood check, etc).     PAIN CONTROL Pain after surgery or related to activity is often due to strain/injury to muscle, tendon, nerves and/or incisions.  This pain is usually  short-term and will improve in a few months.  To help speed the process of healing and to get back to regular activity more quickly, DO THE FOLLOWING THINGS TOGETHER: 1. Increase activity gradually.  DO NOT PUSH THROUGH PAIN 2. Use Ice and/or Heat 3. Try Gentle Massage and/or Stretching 4. Take over the counter pain medication 5. Take Narcotic prescription pain medication for more severe pain  Good pain control = faster recovery.  It is better to take more medicine to be more active than to stay in bed all day to avoid medications. 1.  Increase activity gradually Avoid heavy lifting at first, then increase to lifting as tolerated over the next 6 weeks. Do not push through the pain.  Listen to your body and avoid positions and maneuvers than reproduce the pain.  Wait a few days before trying something more intense Walking an hour a day is encouraged to help your body recover faster  and more safely.  Start slowly and stop when getting sore.  If you can walk 30 minutes without stopping or pain, you can try more intense activity (running, jogging, aerobics, cycling, swimming, treadmill, sex, sports, weightlifting, etc.) Remember: If it hurts to do it, then dont do it! 2. Use Ice and/or Heat You will have swelling and bruising around the incisions.  This will take several weeks to resolve. Ice packs or heating pads (6-8 times a day, 30-60 minutes at a time) will help sooth soreness & bruising. Some people prefer to use ice alone, heat alone, or alternate between ice & heat.  Experiment and see what works best for you.  Consider trying ice for the first few days to help decrease swelling and bruising; then, switch to heat to help relax sore spots and speed recovery. Shower every day.  Short baths are fine.  It feels good!  Keep the incisions and wounds clean with soap & water.   3. Try Gentle Massage and/or Stretching Massage at the area of pain many times a day Stop if you feel pain - do not  overdo it 4. Take over the counter pain medication This helps the muscle and nerve tissues become less irritable and calm down faster Choose ONE of the following over-the-counter anti-inflammatory medications: Acetaminophen 567m tabs (Tylenol) 1-2 pills with every meal and just before bedtime (avoid if you have liver problems or if you have acetaminophen in you narcotic prescription) Naproxen 2224mtabs (ex. Aleve, Naprosyn) 1-2 pills twice a day (avoid if you have kidney, stomach, IBD, or bleeding problems) Ibuprofen 20037mabs (ex. Advil, Motrin) 3-4 pills with every meal and just before bedtime (avoid if you have kidney, stomach, IBD, or bleeding problems) Take with food/snack several times a day as directed for at least 2 weeks to help keep pain / soreness down & more manageable. 5. Take Narcotic prescription pain medication for more severe pain A prescription for strong pain control is often given to you upon discharge (for example: oxycodone/Percocet, hydrocodone/Norco/Vicodin, or tramadol/Ultram) Take your pain medication as prescribed. Be mindful that most narcotic prescriptions contain Tylenol (acetaminophen) as well - avoid taking too much Tylenol. If you are having problems/concerns with the prescription medicine (does not control pain, nausea, vomiting, rash, itching, etc.), please call us [phone] see if we need to switch you to a different pain medicine that will work better for you and/or control your side effects better. If you need a refill on your pain medication, you must call the office before 4 pm and on weekdays only.  By federal law, prescriptions for narcotics cannot be called into a pharmacy.  They must be filled out on paper & picked up from our office by the patient or authorized caretaker.  Prescriptions cannot be filled after 4 pm nor on weekends.    WHEN TO CALL US [phone] uncontrolled or worsening pain  Fever over 101 F (38.5 C) Concerns with  the incision: Worsening pain, redness, rash/hives, swelling, bleeding, or drainage Reactions / problems with new medications (itching, rash, hives, nausea, etc.) Nausea and/or vomiting Difficulty urinating Difficulty breathing Worsening fatigue, dizziness, lightheadedness, blurred vision Other concerns If you are not getting better after two weeks or are noticing you are getting worse, contact our office [phone] for further advice.  We may need to adjust your medications, re-evaluate you in the office, send you to the emergency room, or see what other things we can do to help. The  clinic staff is available to answer your questions during regular business hours (8:30am-5pm).  Please dont hesitate to call and ask to speak to one of our nurses for clinical concerns.    A surgeon from [facility] is always on call at the hospitals 24 hours/day If you have a medical emergency, go to the nearest emergency room or call 911.  FOLLOW UP in our office One the day of your discharge from the hospital (or the next business weekday), please call [facility] to set up or confirm an appointment to see your surgeon in the office for a follow-up appointment.  Usually it is 2-3 weeks after your surgery.   If you have skin staples at your incision(s), let the office know so we can set up a time in the office for the nurse to remove them (usually around 10 days after surgery). Make sure that you call for appointments the day of discharge (or the next business weekday) from the hospital to ensure a convenient appointment time. IF YOU HAVE DISABILITY OR FAMILY LEAVE FORMS, BRING THEM TO THE OFFICE FOR PROCESSING.  DO NOT GIVE THEM TO YOUR DOCTOR.  [facility] [address] [place], [place], [place]  [id number] ? [phone] - Main [phone] - [place],  [phone] - Fax [email or web address]  GETTING TO GOOD BOWEL HEALTH. It is  expected for your digestive tract to need a few months to get back to normal.  It is common for your bowel movements and stools to be irregular.  You will have occasional bloating and cramping that should eventually fade away.  Until you are eating solid food normally, off all pain medications, and back to regular activities; your bowels will not be normal.   Avoiding constipation The goal: ONE SOFT BOWEL MOVEMENT A DAY!    Drink plenty of fluids.  Choose water first. TAKE A FIBER SUPPLEMENT EVERY DAY THE REST OF YOUR LIFE During your first week back home, gradually add back a fiber supplement every day Experiment which form you can tolerate.   There are many forms such as powders, tablets, wafers, gummies, etc Psyllium bran (Metamucil), methylcellulose (Citrucel), Miralax or Glycolax, Benefiber, Flax Seed.  Adjust the dose week-by-week (1/2 dose/day to 6 doses a day) until you are moving your bowels 1-2 times a day.  Cut back the dose or try a different fiber product if it is giving you problems such as diarrhea or bloating. Sometimes a laxative is needed to help jump-start bowels if constipated until the fiber supplement can help regulate your bowels.  If you are tolerating eating & you are farting, it is okay to try a gentle laxative such as double dose MiraLax, prune juice, or Milk of Magnesia.  Avoid using laxatives too often. Stool softeners can sometimes help counteract the constipating effects of narcotic pain medicines.  It can also cause diarrhea, so avoid using for too long. If you are still constipated despite taking fiber daily, eating solids, and a few doses of laxatives, call our office. Controlling diarrhea Try drinking liquids and eating bland foods for a few days to avoid stressing your intestines further. Avoid dairy products (especially milk & ice cream) for a short time.  The intestines often can lose the ability to digest lactose when stressed. Avoid foods that cause gassiness or  bloating.  Typical foods include beans and other legumes, cabbage, broccoli, and dairy foods.  Avoid greasy, spicy, fast foods.  Every person has  some sensitivity to other foods, so listen to your body and avoid those foods that trigger problems for you. Probiotics (such as active yogurt, Align, etc) may help repopulate the intestines and colon with normal bacteria and calm down a sensitive digestive tract Adding a fiber supplement gradually can help thicken stools by absorbing excess fluid and retrain the intestines to act more normally.  Slowly increase the dose over a few weeks.  Too much fiber too soon can backfire and cause cramping & bloating. It is okay to try and slow down diarrhea with a few doses of antidiarrheal medicines.   Bismuth subsalicylate (ex. Kayopectate, Pepto Bismol) for a few doses can help control diarrhea.  Avoid if pregnant.   Loperamide (Imodium) can slow down diarrhea.  Start with one tablet (19m) first.  Avoid if you are having fevers or severe pain.  ILEOSTOMY PATIENTS WILL HAVE CHRONIC DIARRHEA since their colon is not in use.    Drink plenty of liquids.  You will need to drink even more glasses of water/liquid a day to avoid getting dehydrated. Record output from your ileostomy.  Expect to empty the bag every 3-4 hours at first.  Most people with a permanent ileostomy empty their bag 4-6 times at the least.   Use antidiarrheal medicine (especially Imodium) several times a day to avoid getting dehydrated.  Start with a dose at bedtime & breakfast.  Adjust up or down as needed.  Increase antidiarrheal medications as directed to avoid emptying the bag more than 8 times a day (every 3 hours). Work with your wound ostomy nurse to learn care for your ostomy.  See ostomy care instructions. TROUBLESHOOTING IRREGULAR BOWELS 1) Start with a soft & bland diet. No spicy, greasy, or fried foods.  2) Avoid gluten/wheat or dairy products from diet to see if symptoms improve. 3) Miralax  17gm or flax seed mixed in [place] water or juice-daily. May use 2-4 times a day as needed. 4) Gas-X, Phazyme, etc. as needed for gas & bloating.  5) Prilosec (omeprazole) over-the-counter as needed 6)  Consider probiotics (Align, Activa, etc) to help calm the bowels down  Call your doctor if you are getting worse or not getting better.  Sometimes further testing (cultures, endoscopy, X-ray studies, CT scans, bloodwork, etc.) may be needed to help diagnose and treat the cause of the diarrhea. [facility], [place]  [phone] Main.    [phone] - Toll Free.   [phone] Fax [email or web address]

## 2016-06-01 LAB — MAGNESIUM: Magnesium: 1.8 mg/dL (ref 1.7–2.4)

## 2016-06-01 LAB — CBC
HEMATOCRIT: 33.3 % — AB (ref 36.0–46.0)
HEMOGLOBIN: 11.1 g/dL — AB (ref 12.0–15.0)
MCH: 29.1 pg (ref 26.0–34.0)
MCHC: 33.3 g/dL (ref 30.0–36.0)
MCV: 87.4 fL (ref 78.0–100.0)
Platelets: 266 10*3/uL (ref 150–400)
RBC: 3.81 MIL/uL — ABNORMAL LOW (ref 3.87–5.11)
RDW: 14.5 % (ref 11.5–15.5)
WBC: 11.8 10*3/uL — AB (ref 4.0–10.5)

## 2016-06-01 LAB — BASIC METABOLIC PANEL
ANION GAP: 6 (ref 5–15)
BUN: 8 mg/dL (ref 6–20)
CHLORIDE: 106 mmol/L (ref 101–111)
CO2: 25 mmol/L (ref 22–32)
Calcium: 9.5 mg/dL (ref 8.9–10.3)
Creatinine, Ser: 0.58 mg/dL (ref 0.44–1.00)
GFR calc Af Amer: >60 mL/min (ref >60)
GLUCOSE: 115 mg/dL — AB (ref 65–99)
POTASSIUM: 4 mmol/L (ref 3.5–5.1)
SODIUM: 137 mmol/L (ref 135–145)

## 2016-06-01 NOTE — Progress Notes (Signed)
Assessment Principal Problem:   Colorectal delayed anastomotic leak - healed, s/p ileostomy takedown 05/31/2016:  Stable overnight; tolerating liquids Active Problems:   Morbid obesity with BMI of 40.0-44.9, adult ([place])   Rectal adenocarcinoma s/p LAR resection 05/25/2015   Crohn's disease ([place])   Hypertension-BP normal this AM   IBS (irritable bowel syndrome)   Depression   Mild ABL anemia      Plan:  Ambulate in hall. Full liquids.   LOS: 1 day     1 Day Post-Op  Subjective: Some pain in right flank area.  No nausea.  Tolerating liquids.  Walked once.  Husband in room.  Objective: Vital signs in last 24 hours: Temp:  [97.7 F (36.5 C)-98.3 F (36.8 C)] 98.2 F (36.8 C) (11/18 0555) Pulse Rate:  [56-89] 56 (11/18 0555) Resp:  [11-18] 18 (11/18 0555) BP: (111-180)/(58-117) 111/62 (11/18 0555) SpO2:  [96 %-100 %] 98 % (11/18 0555) Weight:  [139.5 kg (307 lb 8.7 oz)] 139.5 kg (307 lb 8.7 oz) (11/18 0555)    Intake/Output from previous day: 11/17 0701 - 11/18 0700 In: 2472.5 [P.O.:640; I.V.:1782.5; IV Piggyback:50] Out: 8938 [Urine:1800; Blood:25] Intake/Output this shift: No intake/output data recorded.  PE: General- In NAD Abdomen-soft, obese, dried drainage on RUQ dressing, other wounds clean and intact  Lab Results:   Recent Labs  05/30/16 0947 06/01/16 0432  WBC 6.9 11.8*  HGB 13.1 11.1*  HCT 39.7 33.3*  PLT 273 266   BMET  Recent Labs  06/01/16 0432  NA 137  K 4.0  CL 106  CO2 25  GLUCOSE 115*  BUN 8  CREATININE 0.58  CALCIUM 9.5   PT/INR No results for input(s): LABPROT, INR in the last 72 hours. Comprehensive Metabolic Panel:    Component Value Date/Time   NA 137 06/01/2016 0432   NA 138 05/13/2016 1233   NA 141 04/15/2016 1524   K 4.0 06/01/2016 0432   K 4.2 05/13/2016 1233   K 4.2 04/15/2016 1524   CL 106 06/01/2016 0432   CL 110 12/21/2015 0528   CO2 25 06/01/2016 0432   CO2 22 05/13/2016 1233   CO2 23 04/15/2016 1524   BUN 8 06/01/2016 0432   BUN 12.1 05/13/2016 1233   BUN 12.3 04/15/2016 1524   CREATININE 0.58 06/01/2016 0432   CREATININE 0.8 05/13/2016 1233   CREATININE 0.7 04/15/2016 1524   GLUCOSE 115 (H) 06/01/2016 0432   GLUCOSE 86 05/13/2016 1233   GLUCOSE 141 (H) 04/15/2016 1524   CALCIUM 9.5 06/01/2016 0432   CALCIUM 10.5 (H) 05/13/2016 1233   CALCIUM 9.9 04/15/2016 1524   AST 14 05/13/2016 1233   AST 16 04/15/2016 1524   ALT 14 05/13/2016 1233   ALT 17 04/15/2016 1524   ALKPHOS 107 05/13/2016 1233   ALKPHOS 77 04/15/2016 1524   BILITOT 0.45 05/13/2016 1233   BILITOT 0.45 04/15/2016 1524   PROT 8.6 (H) 05/13/2016 1233   PROT 7.4 04/15/2016 1524   ALBUMIN 4.0 05/13/2016 1233   ALBUMIN 3.5 04/15/2016 1524     Studies/Results: No results found.  Anti-infectives: Anti-infectives    Start     Dose/Rate Route Frequency Ordered Stop   05/31/16 1400  clindamycin (CLEOCIN) IVPB 900 mg     900 mg 100 mL/hr over 30 Minutes Intravenous Every 8 hours 05/31/16 1138 05/31/16 1453   05/31/16 0550  cefoTEtan (CEFOTAN) 2 g in dextrose 5 % 50 mL IVPB     2 g 100 mL/hr over 30 Minutes  Intravenous On call to O.R. 05/31/16 0550 05/31/16 0735       Leandro Berkowitz J 06/01/2016

## 2016-06-02 MED ORDER — SODIUM CHLORIDE 0.9% FLUSH: 10.0000 mL | INTRAVENOUS | Status: DC | PRN

## 2016-06-02 NOTE — Progress Notes (Signed)
Assessment Principal Problem:   Colorectal delayed anastomotic leak - bowel function returning. Active Problems:   Morbid obesity with BMI of 40.0-44.9, adult ([place])   Rectal adenocarcinoma s/p LAR resection 05/25/2015   Crohn's disease ([place])   Hypertension-BP normal this AM   IBS (irritable bowel syndrome)   Depression   Mild ABL anemia      Plan:  Soft diet.   LOS: 2 days     2 Days Post-Op  Subjective: Sore in right flank.  Passing a lot of gas. Objective: Vital signs in last 24 hours: Temp:  [97.8 F (36.6 C)-98.6 F (37 C)] 98.6 F (37 C) (11/19 0535) Pulse Rate:  [52-93] 58 (11/19 0535) Resp:  [16-19] 18 (11/19 0535) BP: (120-137)/(62-83) 123/62 (11/19 0535) SpO2:  [95 %-100 %] 99 % (11/19 0535) Weight:  [140.6 kg (309 lb 15.5 oz)] 140.6 kg (309 lb 15.5 oz) (11/19 0535) Last BM Date:  (pta)  Intake/Output from previous day: 11/18 0701 - 11/19 0700 In: 2320 [P.O.:720; I.V.:1600] Out: 550 [Urine:550] Intake/Output this shift: No intake/output data recorded.  PE: General- In NAD Abdomen-soft, obese, dried drainage on RUQ dressing with some surrounding bruising, other wounds clean and intact  Lab Results:   Recent Labs  05/30/16 0947 06/01/16 0432  WBC 6.9 11.8*  HGB 13.1 11.1*  HCT 39.7 33.3*  PLT 273 266   BMET  Recent Labs  06/01/16 0432  NA 137  K 4.0  CL 106  CO2 25  GLUCOSE 115*  BUN 8  CREATININE 0.58  CALCIUM 9.5   PT/INR No results for input(s): LABPROT, INR in the last 72 hours. Comprehensive Metabolic Panel:    Component Value Date/Time   NA 137 06/01/2016 0432   NA 138 05/13/2016 1233   NA 141 04/15/2016 1524   K 4.0 06/01/2016 0432   K 4.2 05/13/2016 1233   K 4.2 04/15/2016 1524   CL 106 06/01/2016 0432   CL 110 12/21/2015 0528   CO2 25 06/01/2016 0432   CO2 22 05/13/2016 1233   CO2 23 04/15/2016 1524   BUN 8 06/01/2016 0432   BUN 12.1 05/13/2016 1233   BUN 12.3 04/15/2016 1524   CREATININE 0.58 06/01/2016 0432   CREATININE 0.8 05/13/2016 1233   CREATININE 0.7 04/15/2016 1524   GLUCOSE 115 (H) 06/01/2016 0432   GLUCOSE 86 05/13/2016 1233   GLUCOSE 141 (H) 04/15/2016 1524   CALCIUM 9.5 06/01/2016 0432   CALCIUM 10.5 (H) 05/13/2016 1233   CALCIUM 9.9 04/15/2016 1524   AST 14 05/13/2016 1233   AST 16 04/15/2016 1524   ALT 14 05/13/2016 1233   ALT 17 04/15/2016 1524   ALKPHOS 107 05/13/2016 1233   ALKPHOS 77 04/15/2016 1524   BILITOT 0.45 05/13/2016 1233   BILITOT 0.45 04/15/2016 1524   PROT 8.6 (H) 05/13/2016 1233   PROT 7.4 04/15/2016 1524   ALBUMIN 4.0 05/13/2016 1233   ALBUMIN 3.5 04/15/2016 1524     Studies/Results: No results found.  Anti-infectives: Anti-infectives    Start     Dose/Rate Route Frequency Ordered Stop   05/31/16 1400  clindamycin (CLEOCIN) IVPB 900 mg     900 mg 100 mL/hr over 30 Minutes Intravenous Every 8 hours 05/31/16 1138 05/31/16 1453   05/31/16 0550  cefoTEtan (CEFOTAN) 2 g in dextrose 5 % 50 mL IVPB     2 g 100 mL/hr over 30 Minutes Intravenous On call to O.R. 05/31/16 0550 05/31/16 0735       Jairen Goldfarb  J 06/02/2016

## 2016-06-02 NOTE — Progress Notes (Signed)
Nutrition Brief Note  Patient identified on the Malnutrition Screening Tool (MST) Report  Patient's weight has been increasing. Pt eating 100% of meals with one exception of 55%. Pt is receiving Boost Breeze TID.  Wt Readings from Last 15 Encounters:  06/02/16 (!) 309 lb 15.5 oz (140.6 kg)  05/30/16 293 lb 6 oz (133.1 kg)  05/13/16 283 lb 14.4 oz (128.8 kg)  04/15/16 279 lb 8 oz (126.8 kg)  03/11/16 269 lb 6.4 oz (122.2 kg)  02/19/16 270 lb 3.2 oz (122.6 kg)  01/22/16 269 lb 14.4 oz (122.4 kg)  12/19/15 262 lb (118.8 kg)  12/05/15 295 lb 6.7 oz (134 kg)  11/22/15 296 lb 8.3 oz (134.5 kg)  11/13/15 281 lb 4 oz (127.6 kg)  09/29/15 285 lb 3.2 oz (129.4 kg)  09/16/15 290 lb 12.6 oz (131.9 kg)  08/03/15 (!) 300 lb 11.2 oz (136.4 kg)  06/24/15 (!) 323 lb (146.5 kg)    Body mass index is 43.85 kg/m. Patient meets criteria for morbid obesity based on current BMI.   Current diet order is soft, patient is consuming approximately 55-100% of meals at this time. Labs and medications reviewed.   No nutrition interventions warranted at this time. If nutrition issues arise, please consult RD.   Clayton Bibles, MS, RD, LDN Pager: [phone] After Hours Pager: [phone]

## 2016-06-03 MED ORDER — HYDROCODONE-ACETAMINOPHEN 7.5-325 MG PO TABS: 1.0000 | ORAL_TABLET | ORAL | 0 refills | Status: DC | PRN

## 2016-06-03 MED ORDER — HYDROCODONE-ACETAMINOPHEN 7.5-325 MG PO TABS
1.0000 | ORAL_TABLET | ORAL | Status: DC | PRN
  Administered 2016-06-03 (×2): 2 via ORAL
  Filled 2016-06-03 (×2): qty 2

## 2016-06-03 MED ORDER — ACETAMINOPHEN 500 MG PO TABS: 500.0000 mg | ORAL_TABLET | Freq: Four times a day (QID) | ORAL | Status: DC | PRN

## 2016-06-03 MED ORDER — ACETAMINOPHEN 500 MG PO TABS: 500.0000 mg | ORAL_TABLET | Freq: Three times a day (TID) | ORAL | Status: DC

## 2016-06-03 MED FILL — HYDROCODON-APAP 7.5-325: 7.5-325 | 6 days supply | Qty: 40 | Fill #0

## 2016-06-03 NOTE — Discharge Summary (Signed)
Physician Discharge Summary  [name] MRN: [id number] DOB/AGE: 1966/04/28 50 y.o.  Admit date: 05/31/2016 Discharge date: 06/03/2016  Patient Care Team: [name], MD as PCP - General (Family Medicine) Michael Boston, MD as Consulting Physician (General Surgery) Truitt Merle, MD as Consulting Physician ([place]) Kyung Rudd, MD as Consulting Physician (Radiation Oncology) Teena Irani, MD as Consulting Physician (Gastroenterology) Raynelle Bring, MD as Consulting Physician (Urology)  Admission Diagnoses: Principal Problem:   Colorectal delayed anastomotic leak - healed, s/p ileostomy takedown 05/31/2016 Active Problems:   Rectal adenocarcinoma s/p LAR resection 05/25/2015   Morbid obesity with BMI of 40.0-44.9, adult ([place])   Crohn's disease ([place])   Hypertension   IBS (irritable bowel syndrome)   Depression   Pelvic abscess s/p drainage & omental pedicle flap 11/17/2015   Discharge Diagnoses:  Principal Problem:   Colorectal delayed anastomotic leak - healed, s/p ileostomy takedown 05/31/2016 Active Problems:   Rectal adenocarcinoma s/p LAR resection 05/25/2015   Morbid obesity with BMI of 40.0-44.9, adult ([place])   Crohn's disease ([place])   Hypertension   IBS (irritable bowel syndrome)   Depression   Pelvic abscess s/p drainage & omental pedicle flap 11/17/2015   POST-OPERATIVE DIAGNOSIS:   loop ileostomy in place for fecal diversion  SURGERY:  05/31/2016  Procedure(s): TAKEDOWN LOOP ILEOSTOMY  SURGEON:    Surgeon(s): Michael Boston, MD  Consults: None  Hospital Course:   The patient underwent the surgery above.  Postoperatively, the patient gradually mobilized and advanced to a solid diet.  Pain and other symptoms were treated aggressively.    By the time of discharge, the patient was walking well the hallways, eating food, having flatus.  Pain was well-controlled on an oral medications.  Based on meeting discharge criteria and continuing to  recover, I felt it was safe for the patient to be discharged from the hospital to further recover with close followup. Postoperative recommendations were discussed in detail.  They are written as well.   Significant Diagnostic Studies:  Results for orders placed or performed during the hospital encounter of 05/31/16 (from the past 72 hour(s))  Basic metabolic panel     Status: Abnormal   Collection Time: 06/01/16  4:32 AM  Result Value Ref Range   Sodium 137 135 - 145 mmol/L   Potassium 4.0 3.5 - 5.1 mmol/L   Chloride 106 101 - 111 mmol/L   CO2 25 22 - 32 mmol/L   Glucose, Bld 115 (H) 65 - 99 mg/dL   BUN 8 6 - 20 mg/dL   Creatinine, Ser 0.58 0.44 - 1.00 mg/dL   Calcium 9.5 8.9 - 10.3 mg/dL   GFR calc non Af Amer >60 >60 mL/min   GFR calc Af Amer >60 >60 mL/min    Comment: (NOTE) The eGFR has been calculated using the CKD EPI equation. This calculation has not been validated in all clinical situations. eGFR's persistently <60 mL/min signify possible Chronic Kidney Disease.    Anion gap 6 5 - 15  CBC     Status: Abnormal   Collection Time: 06/01/16  4:32 AM  Result Value Ref Range   WBC 11.8 (H) 4.0 - 10.5 K/uL   RBC 3.81 (L) 3.87 - 5.11 MIL/uL   Hemoglobin 11.1 (L) 12.0 - 15.0 g/dL   HCT 33.3 (L) 36.0 - 46.0 %   MCV 87.4 78.0 - 100.0 fL   MCH 29.1 26.0 - 34.0 pg   MCHC 33.3 30.0 - 36.0 g/dL   RDW 14.5  11.5 - 15.5 %   Platelets 266 150 - 400 K/uL  Magnesium     Status: None   Collection Time: 06/01/16  4:32 AM  Result Value Ref Range   Magnesium 1.8 1.7 - 2.4 mg/dL    No results found.  Discharge Exam: Blood pressure 139/73, pulse 67, temperature 98.2 F (36.8 C), temperature source Oral, resp. rate 18, height 5' 10.5" (1.791 m), weight 135.8 kg (299 lb 4.8 oz), SpO2 97 %.  General: Pt awake/alert/oriented x4 in no major acute distress Eyes: PERRL, normal EOM. Sclera nonicteric Neuro: CN II-XII intact w/o focal sensory/motor deficits. Lymph: No head/neck/groin  lymphadenopathy Psych:  No delerium/psychosis/paranoia HENT: Normocephalic, Mucus membranes moist.  No thrush Neck: Supple, No tracheal deviation Chest: No pain.  Good respiratory excursion. CV:  Pulses intact.  Regular rhythm MS: Normal AROM mjr joints.  No obvious deformity Abdomen: Soft, Nondistended.  Min tender at incision.  Old ecchymosis - clean.  No incarcerated hernias. Ext:  SCDs BLE.  No significant edema.  No cyanosis Skin: No petechiae / purpura  Discharged Condition: good   Past Medical History:  Diagnosis Date  . Anemia   . Anxiety   . Bulging disc    L5- not a problem today 05-30-16.  . Cancer [facility])    rectal cancer- surgery, chemo last 1 yr ago, radiation.Dr. Angelia Mould. Lisbeth Renshaw.  . Complication of anesthesia   . Cystadenoma of right ovary s/p RSO 05/25/2015 06/28/2015   resovlevd with surgery  . Depression   . Fibroids    resolved with hysterectomy.  Marland Kitchen GERD (gastroesophageal reflux disease)   . Heart murmur   . History of blood transfusion   . History of chemotherapy   . History of frequent ear infections   . History of frequent urinary tract infections    middle school age- no recent issues  . History of hiatal hernia   . History of ileostomy   . History of mitral valve prolapse   . Hypercholesteremia   . Hypertension    B/P med stopped  2 months ago.  . IBS (irritable bowel syndrome)   . Large bowel delayed anastomotic leak s/p diverting loop ileostomy 11/17/2015 11/17/2015  . Mild sleep apnea    denies -was evaluated-did not have-no cpap ever used  . Numbness and tingling    great toe bilat left more than right , remains  . Obesity   . Occasional tremors   . Osteoarthritis, knee    left shoulder and left knee  . Pelvic abscess s/p drainage & omental pedicle flap 11/17/2015 06/23/2015  . PONV (postoperative nausea and vomiting)    severe" needs Scopolamine PATCH" post op.  . Portacath in place    right chest  . Rectal bleeding   . S/P radiation  therapy 02/20/15-03/30/15   colon/rectal  . Tear of left rotator cuff    surgery done but remains with some pain intermittent.  . TMJ (dislocation of temporomandibular joint)   . Vertigo   . Vitamin D deficiency   . Wears glasses     Past Surgical History:  Procedure Laterality Date  . ABDOMINAL HYSTERECTOMY  1996   Removed uterus, and tubes  . DIAGNOSTIC LAPAROSCOPY     11'16 right oophorectomy  . DILATION AND CURETTAGE OF UTERUS     times 2  . EUS N/A 01/18/2015   Procedure: LOWER ENDOSCOPIC ULTRASOUND (EUS);  Surgeon: Arta Silence, MD;  Location: [facility];  Service: Endoscopy;  Laterality: N/A;  . EXCISION  OF SKIN TAG  11/17/2015   Procedure: EXCISION OF SKIN TAG;  Surgeon: Michael Boston, MD;  Location: [facility];  Service: General;;  . ILEO LOOP COLOSTOMY CLOSURE N/A 11/17/2015   Procedure: LAPAROSCOPIC DIVERTING LOOP ILEOSTOMY  DRAINAGE OF PELVIC ABSCESS;  Surgeon: Michael Boston, MD;  Location: [facility];  Service: General;  Laterality: N/A;  . ILEOSTOMY CLOSURE N/A 05/31/2016   Procedure: TAKEDOWN LOOP ILEOSTOMY;  Surgeon: Michael Boston, MD;  Location: [facility];  Service: General;  Laterality: N/A;  . IMPACTION REMOVAL  11/17/2015   Procedure: DISIMPACTION REMOVAL;  Surgeon: Michael Boston, MD;  Location: [facility];  Service: General;;  . JP drain     . LAPAROSCOPIC LYSIS OF ADHESIONS  11/17/2015   Procedure: LAPAROSCOPIC LYSIS OF ADHESIONS;  Surgeon: Michael Boston, MD;  Location: [facility];  Service: General;;  . left knee arthroscopy     mult  . PORTACATH PLACEMENT N/A 05/25/2015   Procedure: INSERTION PORT-A-CATH;  Surgeon: Michael Boston, MD;  Location: [facility];  Service: General;  Laterality: N/A;-remains inplace Right chest.  . ROTATOR CUFF REPAIR Left   . TONSILLECTOMY    . XI ROBOTIC ASSISTED LOWER ANTERIOR RESECTION N/A 05/25/2015   Procedure: XI ROBOTIC ASSISTED LOWER ANTERIOR RESECTION, , RIGID PROCTOSCOPY, RIGHT OOPHORECTOMY;  Surgeon: Michael Boston, MD;  Location: [facility];  Service: General;   Laterality: N/A;    Social History   Social History  . Marital status: Married    Spouse name: N/A  . Number of children: N/A  . Years of education: N/A   Occupational History  . Not on file.   Social History Main Topics  . Smoking status: Former Smoker    Packs/day: 0.50    Years: 7.00    Types: Cigarettes    Quit date: 07/16/1991  . Smokeless tobacco: Never Used     Comment: 1 pack/week intermittently x 7 years  . Alcohol use 0.6 oz/week    1 Shots of liquor per week     Comment: rarely   . Drug use: No  . Sexual activity: Yes    Birth control/ protection: Surgical   Other Topics Concern  . Not on file   Social History Narrative   Tobacco Use: Cigarettes - Former Smoker   Alcohol: Yes, very rare, liquor.    No recreational drug use   Occupation: Head CMA @ [employer]   Marital Status: Married    Husband: Roselyn Reef Disabled   Children: 2 adopted kids [place]   Religion: [employer] [place]                   Family History  Problem Relation Age of Onset  . Coronary artery disease Mother 50  . Hypertension Mother   . Hyperlipidemia Mother   . Diabetes Mellitus I Mother   . Coronary artery disease Father   . Hyperlipidemia Father   . Hypertension Father   . Cancer Sister     skin - non melanoma  . Hyperlipidemia Brother   . Cancer Maternal Uncle 22    pancreatic with mets to colon and prostate  . Cirrhosis Maternal Uncle   . Hypertension Maternal Grandmother   . Diabetes Mellitus I Maternal Grandmother   . Hyperlipidemia Maternal Grandmother   . CVA Maternal Grandmother   . Hypertension Maternal Grandfather   . Coronary artery disease Maternal Grandfather 65  . Hyperlipidemia Maternal Grandfather   . Coronary artery disease Paternal Grandmother   . Hypertension Paternal  Grandmother   . Hyperlipidemia Paternal Grandmother   . Diabetes Mellitus I Paternal Grandmother   . Hypertension Paternal Grandfather   . Hyperlipidemia  Paternal Grandfather   . Coronary artery disease Paternal Grandfather     Current Facility-Administered Medications  Medication Dose Route Frequency Provider Last Rate Last Dose  . 0.9 %  sodium chloride infusion  250 mL Intravenous PRN Michael Boston, MD      . acetaminophen (TYLENOL) tablet 500 mg  500 mg Oral Q6H PRN Michael Boston, MD      . alum & mag hydroxide-simeth (MAALOX/MYLANTA) 200-200-20 MG/5ML suspension 30 mL  30 mL Oral Q6H PRN Michael Boston, MD      . alvimopan (ENTEREG) capsule 12 mg  12 mg Oral BID Michael Boston, MD   12 mg at 06/02/16 2124  . bismuth subsalicylate (PEPTO BISMOL) 262 MG/15ML suspension 30 mL  30 mL Oral Q8H PRN Michael Boston, MD      . diphenhydrAMINE (BENADRYL) 12.5 MG/5ML elixir 12.5 mg  12.5 mg Oral Q6H PRN Michael Boston, MD       Or  . diphenhydrAMINE (BENADRYL) injection 12.5 mg  12.5 mg Intravenous Q6H PRN Michael Boston, MD      . DULoxetine (CYMBALTA) DR capsule 60 mg  60 mg Oral QHS Michael Boston, MD   60 mg at 06/02/16 2124  . enoxaparin (LOVENOX) injection 40 mg  40 mg Subcutaneous Q24H Michael Boston, MD   40 mg at 06/02/16 0804  . feeding supplement (BOOST / RESOURCE BREEZE) liquid 1 Container  1 Container Oral TID BM Michael Boston, MD      . HYDROcodone-acetaminophen ([place]) 7.5-325 MG per tablet 1-2 tablet  1-2 tablet Oral Q4H PRN Michael Boston, MD      . HYDROmorphone (DILAUDID) injection 0.5-2 mg  0.5-2 mg Intravenous Q1H PRN Michael Boston, MD   1 mg at 06/03/16 0044  . hyoscyamine (LEVBID) 0.375 MG 12 hr tablet 0.375 mg  0.375 mg Oral Q12H PRN Michael Boston, MD      . lidocaine-prilocaine (EMLA) cream 1 application  1 application Topical PRN Michael Boston, MD      . lip balm (CARMEX) ointment 1 application  1 application Topical BID Michael Boston, MD   1 application at 42/68/34 1000  . LORazepam (ATIVAN) injection 0.5-1 mg  0.5-1 mg Intravenous Q8H PRN Michael Boston, MD      . magic mouthwash  15 mL Oral QID PRN Michael Boston, MD      . meloxicam [facility])  tablet 15 mg  15 mg Oral Daily Michael Boston, MD   15 mg at 06/02/16 1000  . menthol-cetylpyridinium (CEPACOL) lozenge 3 mg  1 lozenge Oral PRN Michael Boston, MD   3 mg at 05/31/16 1424  . methocarbamol (ROBAXIN) tablet 500-1,000 mg  500-1,000 mg Oral Q8H PRN Michael Boston, MD   1,000 mg at 06/02/16 1919  . metoprolol (LOPRESSOR) injection 5 mg  5 mg Intravenous Q6H PRN Michael Boston, MD      . ondansetron [facility]) tablet 8 mg  8 mg Oral BID PRN Michael Boston, MD      . pantoprazole (PROTONIX) EC tablet 80 mg  80 mg Oral Q1200 Michael Boston, MD   80 mg at 06/02/16 1156  . phenol (CHLORASEPTIC) mouth spray 2 spray  2 spray Mouth/Throat PRN Michael Boston, MD      . prochlorperazine (COMPAZINE) injection 10 mg  10 mg Intravenous Q6H PRN Michael Boston, MD      .  promethazine (PHENERGAN) tablet 25 mg  25 mg Oral Q8H PRN Michael Boston, MD      . saccharomyces boulardii (FLORASTOR) capsule 250 mg  250 mg Oral BID Michael Boston, MD   250 mg at 06/02/16 2124  . sodium chloride flush (NS) 0.9 % injection 10-40 mL  10-40 mL Intracatheter PRN Michael Boston, MD      . sodium chloride flush (NS) 0.9 % injection 3 mL  3 mL Intravenous Q12H Michael Boston, MD   3 mL at 06/01/16 2200  . sodium chloride flush (NS) 0.9 % injection 3 mL  3 mL Intravenous PRN Michael Boston, MD      . vitamin C (ASCORBIC ACID) tablet 500 mg  500 mg Oral BID Michael Boston, MD   500 mg at 06/02/16 2124  . Vitamin D (Ergocalciferol) (DRISDOL) capsule 50,000 Units  50,000 Units Oral Once per day on Tue Sat Michael Boston, MD   50,000 Units at 06/01/16 9735   Facility-Administered Medications Ordered in Other Encounters  Medication Dose Route Frequency Provider Last Rate Last Dose  . 0.9 %  sodium chloride infusion   Intravenous Once Truitt Merle, MD         Allergies  Allergen Reactions  . Caine-1 [Lidocaine] Rash    Eyes swell shut; includes all caine drugs except marcaine. EMLA cream OK though (?!)  . Flagyl [Metronidazole] Nausea Only  . Adhesive  [Tape] Other (See Comments)    Blisters - can use paper tape  . Iron Nausea Only  . Oxycodone     NIGHTMARES. (tolerates hydrocodone or tramadol better)  . Penicillins Rash    Has patient had a PCN reaction causing immediate rash, facial/tongue/throat swelling, SOB or lightheadedness with hypotension: no Has patient had a PCN reaction causing severe rash involving mucus membranes or skin necrosis: no Has patient had a PCN reaction that required hospitalization no Has patient had a PCN reaction occurring within the last 10 years: no If all of the above answers are "NO", then may proceed with Cephalosporin use.   . Sulfa Antibiotics Rash    Rash & Vomiting    Disposition: 06-Home-Health Care Svc  Discharge Instructions    Call MD for:    Complete by:  As directed    Temperature > 101.73F   Call MD for:    Complete by:  As directed    Temperature > 101.73F   Call MD for:  extreme fatigue    Complete by:  As directed    Call MD for:  extreme fatigue    Complete by:  As directed    Call MD for:  hives    Complete by:  As directed    Call MD for:  hives    Complete by:  As directed    Call MD for:  persistant nausea and vomiting    Complete by:  As directed    Call MD for:  persistant nausea and vomiting    Complete by:  As directed    Call MD for:  redness, tenderness, or signs of infection (pain, swelling, redness, odor or green/yellow discharge around incision site)    Complete by:  As directed    Call MD for:  redness, tenderness, or signs of infection (pain, swelling, redness, odor or green/yellow discharge around incision site)    Complete by:  As directed    Call MD for:  severe uncontrolled pain    Complete by:  As directed    Call MD  for:  severe uncontrolled pain    Complete by:  As directed    Diet - low sodium heart healthy    Complete by:  As directed    Start with bland, low residue diet for a few days, then advance to a heart healthy (low fat, high fiber) diet.   If you feel nauseated or constipated, simplify to a liquid only diet for 48 hours until you are feeling better (no more nausea, farting/passing gas, having a bowel movement, etc...).  If you cannot tolerate even drinking liquids, or feeling worse, let your surgeon know or go to the Emergency Department for help.   Discharge instructions    Complete by:  As directed    Please see discharge instruction sheets.   Also refer to any handouts/printouts that may have been given from the [facility] surgery office (if you visited [place] there before surgery) Please call our office if you have any questions or concerns [phone]   Discharge instructions    Complete by:  As directed    Please see discharge instruction sheets.   Also refer to any handouts/printouts that may have been given from the [facility] surgery office (if you visited [place] there before surgery) Please call our office if you have any questions or concerns [phone]   Discharge wound care:    Complete by:  As directed    If you have closed incisions: Shower and bathe over these incisions with soap and water every day.  It is OK to wash over the dressings: they are waterproof. Remove all surgical dressings on postoperative day #3.  You do not need to replace dressings over the closed incisions unless you feel more comfortable with a Band-Aid covering it.   If you have an open wound: That requires packing, so please see wound care instructions.   In general, remove all dressings, wash wound with soap and water and then replace with saline moistened gauze.  Do the dressing change at least every day.    Please call our office [phone] if you have further questions.   Discharge wound care:    Complete by:  As directed    If you have closed incisions: Shower and bathe over these incisions with soap and water every day.  It is OK to wash over the dressings: they are waterproof. Remove all surgical dressings on postoperative day #3.  You do not  need to replace dressings over the closed incisions unless you feel more comfortable with a Band-Aid covering it.   If you have an open wound: That requires packing, so please see wound care instructions.   In general, remove all dressings, wash wound with soap and water and then replace with saline moistened gauze.  Do the dressing change at least every day.    Please call our office [phone] if you have further questions.   Driving Restrictions    Complete by:  As directed    No driving until off narcotics and can safely swerve away without pain during an emergency   Driving Restrictions    Complete by:  As directed    No driving until off narcotics and can safely swerve away without pain during an emergency   Increase activity slowly    Complete by:  As directed    Increase activity slowly    Complete by:  As directed    Lifting restrictions    Complete by:  As directed    Avoid heavy lifting initially, <20  pounds at first.   Do not push through pain.   You have no specific weight limit: If it hurts to do, DON'T DO IT.    If you feel no pain, you are not injuring anything.  Pain will protect you from injury.   Coughing and sneezing are far more stressful to your incision than any lifting.   Avoid resuming heavy lifting (>50 pounds) or other intense activity until off all narcotic pain medications.   When want to exercise more, give yourself 2 weeks to gradually get back to full intense exercise/activity.   Lifting restrictions    Complete by:  As directed    Avoid heavy lifting initially, <20 pounds at first.   Do not push through pain.   You have no specific weight limit: If it hurts to do, DON'T DO IT.    If you feel no pain, you are not injuring anything.  Pain will protect you from injury.   Coughing and sneezing are far more stressful to your incision than any lifting.   Avoid resuming heavy lifting (>50 pounds) or other intense activity until off all narcotic pain  medications.   When want to exercise more, give yourself 2 weeks to gradually get back to full intense exercise/activity.   May shower / Bathe    Complete by:  As directed    [place].  It is fine for dressings or wounds to be washed/rinsed.  Use gentle soap & water.  This will help the incisions and/or wounds get clean & minimize infection.   May shower / Bathe    Complete by:  As directed    [place].  It is fine for dressings or wounds to be washed/rinsed.  Use gentle soap & water.  This will help the incisions and/or wounds get clean & minimize infection.   May walk up steps    Complete by:  As directed    May walk up steps    Complete by:  As directed    Sexual Activity Restrictions    Complete by:  As directed    Sexual activity as tolerated.  Do not push through pain.  Pain will protect you from injury.   Sexual Activity Restrictions    Complete by:  As directed    Sexual activity as tolerated.  Do not push through pain.  Pain will protect you from injury.   Walk with assistance    Complete by:  As directed    Walk over an hour a day.  May use a walker/cane/companion to help with balance and stamina.   Walk with assistance    Complete by:  As directed    Walk over an hour a day.  May use a walker/cane/companion to help with balance and stamina.       Medication List    STOP taking these medications   HYDROcodone-acetaminophen 5-325 MG tablet Commonly known as:  NORCO/VICODIN Replaced by:  HYDROcodone-acetaminophen 7.5-325 MG tablet   potassium chloride SA 20 MEQ tablet Commonly known as:  K-DUR,KLOR-CON     TAKE these medications   CoQ-10 100 MG Caps Take 100 mg by mouth daily.   diphenoxylate-atropine 2.5-0.025 MG tablet Commonly known as:  LOMOTIL TAKE ONE TABLET BY MOUTH FOUR TIMES DAILY AS NEEDED FOR DIARRHEA   DULoxetine 60 MG capsule Commonly known as:  CYMBALTA Take 60 mg by mouth at bedtime.   esomeprazole 40 MG capsule Commonly known  as:  NEXIUM Take 1 capsule (  40 mg total) by mouth 2 (two) times daily before a meal.   HYDROcodone-acetaminophen 7.5-325 MG tablet Commonly known as:  NORCO Take 1-2 tablets by mouth every 4 (four) hours as needed for moderate pain or severe pain. Replaces:  HYDROcodone-acetaminophen 5-325 MG tablet   hyoscyamine 0.375 MG 12 hr tablet Commonly known as:  LEVBID Take 0.375 mg by mouth 2 (two) times daily.   lidocaine-prilocaine cream Commonly known as:  EMLA Apply to affected area once before port access What changed:  how much to take  how to take this  when to take this  additional instructions   Melatonin 5 MG Caps Take 10 mg by mouth at bedtime.   meloxicam 15 MG tablet Commonly known as:  MOBIC Take 15 mg by mouth daily.   methocarbamol 500 MG tablet Commonly known as:  ROBAXIN Take 2 tablets (1,000 mg total) by mouth every 6 (six) hours as needed for muscle spasms. What changed:  how much to take  when to take this   ondansetron 8 MG tablet Commonly known as:  ZOFRAN Take 1 tablet (8 mg total) by mouth 2 (two) times daily as needed for nausea or vomiting. Start the day after chemo for 2 days. Then take as needed for nausea or vomiting.   promethazine 25 MG tablet Commonly known as:  PHENERGAN Take 25 mg by mouth every 8 (eight) hours as needed for nausea or vomiting.   traMADol 50 MG tablet Commonly known as:  ULTRAM Take 50 mg by mouth 2 (two) times daily as needed for moderate pain. pain   Vitamin D (Ergocalciferol) 50000 units Caps capsule Commonly known as:  DRISDOL Take 50,000 Units by mouth 2 (two) times a week.      Follow-up Information    Dhriti Fales C., MD. Schedule an appointment as soon as possible for a visit in 2 weeks.   Specialty:  General Surgery Why:  To follow up after your operation, To follow up after your hospital stay Contact information: [address] [place] [address] [phone]             Signed: Morton Peters, M.D., F.A.C.S. Gastrointestinal and Minimally Invasive Surgery [facility] [address], [place],  [address] [phone] Main / Paging   06/03/2016, 7:47 AM

## 2016-06-03 NOTE — Progress Notes (Signed)
[address] [place], [place] [address] Phone: [phone] FAX: [phone]   Lindsay Little [id number] 01/09/1966  CARE TEAM:  PCP: Vidal Schwalbe, MD  Outpatient Care Team: Patient Care Team: [name], MD as PCP - General (Family Medicine) Michael Boston, MD as Consulting Physician (General Surgery) Truitt Merle, MD as Consulting Physician (Medical Oncology) Kyung Rudd, MD as Consulting Physician (Radiation Oncology) Teena Irani, MD as Consulting Physician (Gastroenterology) Raynelle Bring, MD as Consulting Physician (Urology)  Inpatient Treatment Team: Treatment Team: Attending Provider: Michael Boston, MD; Registered Nurse: Mertha Baars, RN; Technician: Jefm Bryant, [place]; Registered Nurse: Bailey Mech, RN; Registered Nurse: Danie Chandler, RN; Technician: Abbe Amsterdam, NT; Technician: Sueanne Margarita, NT  Problem List:   Principal Problem:   Colorectal delayed anastomotic leak - healed, s/p ileostomy takedown 05/31/2016 Active Problems:   Rectal adenocarcinoma s/p LAR resection 05/25/2015   Morbid obesity with BMI of 40.0-44.9, adult ([place])   Crohn's disease ([place])   Hypertension   IBS (irritable bowel syndrome)   Depression   Pelvic abscess s/p drainage & omental pedicle flap 11/17/2015   3 Days Post-Op  05/31/2016  POST-OPERATIVE DIAGNOSIS:  loop ileostomy in place for fecal diversion  PROCEDURE:  Procedure(s): TAKEDOWN LOOP ILEOSTOMY  SURGEON: Michael Boston, MD    Assessment  Recovering  Plan:  -inc pain control -HH diet -PPI GERD -VTE prophylaxis- SCDs, etc -mobilize as tolerated to help recovery  D/C patient from hospital when patient meets criteria (anticipate in 0-1 day(s)):  Tolerating oral intake well Ambulating well Adequate pain control without IV medications Urinating  Having flatus Disposition planning in place   [name], M.D.,  F.A.C.S. Gastrointestinal and Minimally Invasive Surgery [facility] [address] [place], [place] [address] [phone] Main / Paging   06/03/2016  Subjective:  Sore - pain meds mostly helping Walking well  Objective:  Vital signs:  Vitals:   06/02/16 0535 06/02/16 1432 06/02/16 2120 06/03/16 0530  BP: 123/62 136/69 (!) 119/54 139/73  Pulse: (!) 58 70 69 67  Resp: 18 18 18 18   Temp: 98.6 F (37 C) 97.9 F (36.6 C) 97.7 F (36.5 C) 98.2 F (36.8 C)  TempSrc: Oral Oral Oral Oral  SpO2: 99% 99% 100% 97%  Weight: (!) 140.6 kg (309 lb 15.5 oz)   135.8 kg (299 lb 4.8 oz)  Height:        Last BM Date:  (had ostomy that was taken down)  Intake/Output   Yesterday:  11/19 0701 - 11/20 0700 In: 1320 [P.O.:120; I.V.:1200] Out: 950 [Urine:950] This shift:  No intake/output data recorded.  Bowel function:  Flatus: YES  BM:  YES  Drain: (No drain)   Physical Exam:  General: Pt awake/alert/oriented x4 in No acute distress.  Smiling, relaxed Eyes: PERRL, normal EOM.  Sclera clear.  No icterus Neuro: CN II-XII intact w/o focal sensory/motor deficits. Lymph: No head/neck/groin lymphadenopathy Psych:  No delerium/psychosis/paranoia HENT: Normocephalic, Mucus membranes moist.  No thrush Neck: Supple, No tracheal deviation Chest: No chest wall pain w good excursion CV:  Pulses intact.  Regular rhythm MS: Normal AROM mjr joints.  No obvious deformity Abdomen: Soft.  Nondistended.  Mildly tender at incisions only.  No evidence of peritonitis.  No incarcerated hernias. Ext:  SCDs BLE.  No mjr edema.  No cyanosis Skin: No petechiae / purpura  Results:   Labs: No results found for this  or any previous visit (from the past 48 hour(s)).  Imaging / Studies: No results found.  Medications / Allergies: per chart  Antibiotics: Anti-infectives    Start     Dose/Rate Route Frequency Ordered Stop   05/31/16 1400  clindamycin  (CLEOCIN) IVPB 900 mg     900 mg 100 mL/hr over 30 Minutes Intravenous Every 8 hours 05/31/16 1138 05/31/16 1453   05/31/16 0550  cefoTEtan (CEFOTAN) 2 g in dextrose 5 % 50 mL IVPB     2 g 100 mL/hr over 30 Minutes Intravenous On call to O.R. 05/31/16 0550 05/31/16 0735        Note: Portions of this report may have been transcribed using voice recognition software. Every effort was made to ensure accuracy; however, inadvertent computerized transcription errors may be present.   Any transcriptional errors that result from this process are unintentional.     [name], M.D., F.A.C.S. Gastrointestinal and Minimally Invasive Surgery [facility] [address] [place], [place] [address] [phone] Main / Paging   06/03/2016

## 2016-06-04 ENCOUNTER — Ambulatory Visit: Payer: No Typology Code available for payment source

## 2016-06-04 ENCOUNTER — Other Ambulatory Visit: Payer: No Typology Code available for payment source

## 2016-06-05 NOTE — Telephone Encounter (Signed)
ERROR

## 2016-06-11 ENCOUNTER — Ambulatory Visit: Payer: No Typology Code available for payment source

## 2016-06-18 ENCOUNTER — Ambulatory Visit: Payer: No Typology Code available for payment source

## 2016-06-24 NOTE — Progress Notes (Signed)
Lindsay Little  [phone] Fax:([phone]  Clinic Follow Up Note   Patient Care Team: [name], MD as PCP - General (Family Medicine) Michael Boston, MD as Consulting Physician (General Surgery) Truitt Merle, MD as Consulting Physician (Medical Oncology) Kyung Rudd, MD as Consulting Physician (Radiation Oncology) Teena Irani, MD as Consulting Physician (Gastroenterology) Raynelle Bring, MD as Consulting Physician (Urology) 06/25/2016  CHIEF COMPLAINTS:  Follow up rectal cancer   Oncology History   Rectal adenocarcinoma   Staging form: Colon and Rectum, AJCC 7th Edition     Clinical: T3, N2, M0 - Unsigned       Rectal adenocarcinoma s/p LAR resection 05/25/2015   01/13/2015 Initial Diagnosis    Rectal adenocarcinoma      01/13/2015 Procedure    Colonoscopy showed a sensitivity O nonobstructing mass in the rectum and from 12-18 cm proximal to the Annis. The mass was circumferential, measuring about 6 cm in length. EGD was negative.      01/17/2015 Tumor Marker    CEA 1.4, CA-19-9 8, MMR normal       01/18/2015 Procedure    Lower endoscopic ultrasound by Dr. Paulita Fujita showed a T3 N2 rectal mass.      01/20/2015 Imaging    CT abdomen and pelvis with contrast showed right lateral rectal wall exophytic mass, and 2 ill-defined hepatic hypoenhancing lesions which appears to correspond to the lesion seen on the prior MRI in 2015.      02/05/2015 Imaging    abdomen MRI showed 2 hemangioma, no suspicion for metastatic disease. CT chest was negative      02/20/2015 - 03/30/2015 Radiation Therapy    neoadjuvant RT to rectal cancer       02/20/2015 Concurrent Chemotherapy    capecitabine 2500 mg in the morning and 2000 mg in the evening (858m/m2, bid), on the day of radiation.       05/25/2015 Surgery    Recto-sigmoid segmental resection, margins are negative       05/25/2015 Pathology Results    0.2cm residual invasive adenocarcinoma, G2, negative margins,  12 nodes were negative       06/23/2015 - 06/29/2015 Hospital Admission    Patient was admitted for pelvic abscess, drain placed and she was treated with IV antibiotics, she also received 1 dose of Feraheme for anemia.      09/15/2015 - 09/17/2015 Hospital Admission    Recurrent pelvic collection s/p perc drainage 09/16/2015      02/21/2016 Imaging    CT abdomen and pelvis w contrast IMPRESSION: 1. Interval removal of surgical drains since 12/19/2015 from the presacral space. Similar size of ill-defined presacral fluid and gas, for which residual abscess cannot be excluded. Similar amount of intraperitoneal edema throughout the upper pelvis with foci of extraluminal gas, possibly related to the presacral process. 2. Diverting right-sided ileostomy, without acute complication. 3. Similar moderate hydroureteronephrosis, likely due to the pelvic process. 4. Possible bladder wall thickening. Correlate with urinalysis. This appearance could be partially due to underdistention. 5. Geographic hepatic steatosis and hemangiomas. Cannot exclude a new right hepatic lobe lesion. Consider nonemergent pre and post contrast outpatient MRI. This could represent a new area of focal steatosis.      05/31/2016 Surgery    ileostomy takedown BY Dr. GJohney Maine       HISTORY OF PRESENTING ILLNESS:  Lindsay SHIROMA50y.o. female is here because of evaluation for management for newly diagnosed rectal adenocarcinoma.   On 09/06/13, she presented  to Dr. Starr Sinclair [facility]] with 1-monthhistory of diarrhea and worsening abdominal pain. She has a history of chronic abdominal pain and had been [place] Dr[place] HAmedeo Plenty Abdominal [place]orea3/6/15 noted to have some hypoechoicity in the left hepatic lobe which was confirmed on MRI to be two hemangiomas  in the right hepatic lobe of measuring up to 3.0 cm along with an additional 7  mm probable cyst.   She presented with worsening of diarrhea for 6 month and was reevaluated by Dr. HAmedeo Plenty She was seen on 12/06/14 at which time it was decided she would undergo HIDA scan given that the pain was intermittently in the RUQ in addition to upper and lower endoscopy. HIDA scan 12/15/14 was reassuring, but she was found to have a malignant tumor of the rectum about 12-18 cm proximal to the anus, gastritis, gastric polyps on endoscopies 01/13/15. Biopsies for these three specimens along with the duodenum were collected and resulted on 01/17/15 for invasive adenocarcinoma, chronic gastritis [H. Pylori negative], fundic gland polyps, and non-specific mild patchy intra-epithelial lymphocytes. Endoscopic [place]orea7/6/16 staged the mass at T3N2Mx and she is here today for consideration of neoadjuvant therapy.    Today, she does report abdominal pain but is no different than her baseline [right-sided, upper, lower] but has had worsening appetite over the last 2 months. She does have diarrhea with bowel movements every 1-2 hours, especially after eating, and has been noticing bleeding with her bowel movement following her endoscopic evaluation. She also feels her energy level has decreased. Otherwise, she denies nausea, chest pain, shortness of breath.    Family history is notable for a maternal uncle who had pancreatic cancer diagnosed in his mid to late 496sthat spread to the colon and prostate before he died about 49years later and a sister with non-melanoma skin cancer. 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 She smoked roughly 20 pack years [1 pack/week x 7 years] in the past and reports occasional alcohol use but denies any prior illicit drug use.  She lost about 7 lbs in the past 2 months  CURRENT THERAPY: Surveillance   INTERIM HISTORY DHilda Bladesreturns for follow-up. The patient had her colostomy reversed on 05/31/16 by Dr. GJohney Maine Reports it has  gone well. She has numerous bowel movements and they are soft. About 7 movements a day. Took Imodium last night which helped. States BTyson Densehelps with the diarrhea and cramps, but it is too expensive and she cannot afford it. Takes Lomotil as well.. Reports upper right abdomen pain that extends to the right lower back and states that "it feels like I'm being pounded by a horse." She taking Norco 7.5-325 as needed and that it helps. Took Zofran for 1 instance of nausea since surgery for a stomach bug last week. She only experienced bowel incontinence with the stomach bug. The patient states she had a colonoscopy before her surgery, but have yet to see her urologist.    HISTORY:  Past Medical History:  Diagnosis Date  . Anemia   . Anxiety   . Bulging disc    L5- not a problem today 05-30-16.  . Cancer ([facility]    rectal cancer- surgery, chemo last 1 yr ago, radiation.Dr. FAngelia Mould MLisbeth Renshaw  . Complication of anesthesia   . Cystadenoma of right ovary s/p RSO 05/25/2015  06/28/2015   resovlevd with surgery  . Depression   . Fibroids    resolved with hysterectomy.  Marland Kitchen GERD (gastroesophageal reflux disease)   . Heart murmur   . History of blood transfusion   . History of chemotherapy   . History of frequent ear infections   . History of frequent urinary tract infections    middle school age- no recent issues  . History of hiatal hernia   . History of ileostomy   . History of mitral valve prolapse   . Hypercholesteremia   . Hypertension    B/P med stopped  2 months ago.  . IBS (irritable bowel syndrome)   . Large bowel delayed anastomotic leak s/p diverting loop ileostomy 11/17/2015 11/17/2015  . Mild sleep apnea    denies -was evaluated-did not have-no cpap ever used  . Numbness and tingling    great toe bilat left more than right , remains  . Obesity   . Occasional tremors   . Osteoarthritis, knee    left shoulder and left knee  . Pelvic abscess s/p drainage & omental pedicle flap 11/17/2015  06/23/2015  . PONV (postoperative nausea and vomiting)    severe" needs Scopolamine PATCH" post op.  . Portacath in place    right chest  . Rectal bleeding   . S/P radiation therapy 02/20/15-03/30/15   colon/rectal  . Tear of left rotator cuff    surgery done but remains with some pain intermittent.  . TMJ (dislocation of temporomandibular joint)   . Vertigo   . Vitamin D deficiency   . Wears glasses     SURGICAL HISTORY: Past Surgical History:  Procedure Laterality Date  . ABDOMINAL HYSTERECTOMY  1996   Removed uterus, and tubes  . DIAGNOSTIC LAPAROSCOPY     11'16 right oophorectomy  . DILATION AND CURETTAGE OF UTERUS     times 2  . EUS N/A 01/18/2015   Procedure: LOWER ENDOSCOPIC ULTRASOUND (EUS);  Surgeon: Arta Silence, MD;  Location: [facility];  Service: Endoscopy;  Laterality: N/A;  . EXCISION OF SKIN TAG  11/17/2015   Procedure: EXCISION OF SKIN TAG;  Surgeon: Michael Boston, MD;  Location: [facility];  Service: General;;  . ILEO LOOP COLOSTOMY CLOSURE N/A 11/17/2015   Procedure: LAPAROSCOPIC DIVERTING LOOP ILEOSTOMY  DRAINAGE OF PELVIC ABSCESS;  Surgeon: Michael Boston, MD;  Location: [facility];  Service: General;  Laterality: N/A;  . ILEOSTOMY CLOSURE N/A 05/31/2016   Procedure: TAKEDOWN LOOP ILEOSTOMY;  Surgeon: Michael Boston, MD;  Location: [facility];  Service: General;  Laterality: N/A;  . IMPACTION REMOVAL  11/17/2015   Procedure: DISIMPACTION REMOVAL;  Surgeon: Michael Boston, MD;  Location: [facility];  Service: General;;  . JP drain     . LAPAROSCOPIC LYSIS OF ADHESIONS  11/17/2015   Procedure: LAPAROSCOPIC LYSIS OF ADHESIONS;  Surgeon: Michael Boston, MD;  Location: [facility];  Service: General;;  . left knee arthroscopy     mult  . PORTACATH PLACEMENT N/A 05/25/2015   Procedure: INSERTION PORT-A-CATH;  Surgeon: Michael Boston, MD;  Location: [facility];  Service: General;  Laterality: N/A;-remains inplace Right chest.  . ROTATOR CUFF REPAIR Left   . TONSILLECTOMY    . XI ROBOTIC ASSISTED LOWER  ANTERIOR RESECTION N/A 05/25/2015   Procedure: XI ROBOTIC ASSISTED LOWER ANTERIOR RESECTION, , RIGID PROCTOSCOPY, RIGHT OOPHORECTOMY;  Surgeon: Michael Boston, MD;  Location: [facility];  Service: General;  Laterality: N/A;    SOCIAL HISTORY: Social History   Social History  .  Marital status: Married    Spouse name: N/A  . Number of children: N/A  . Years of education: N/A   Occupational History  . Not on file.   Social History Main Topics  . Smoking status: Former Smoker    Packs/day: 0.50    Years: 7.00    Types: Cigarettes    Quit date: 07/16/1991  . Smokeless tobacco: Never Used     Comment: 1 pack/week intermittently x 7 years  . Alcohol use 0.6 oz/week    1 Shots of liquor per week     Comment: rarely   . Drug use: No  . Sexual activity: Yes    Birth control/ protection: Surgical   Other Topics Concern  . Not on file   Social History Narrative   Tobacco Use: Cigarettes - Former Smoker   Alcohol: Yes, very rare, liquor.    No recreational drug use   Occupation: Head CMA @ [employer]   Marital Status: Married    Husband: Roselyn Reef Disabled   Children: 2 adopted kids [place]   Religion: [employer] [place] HISTORY: Family History  Problem Relation Age of Onset  . Coronary artery disease Mother 54  . Hypertension Mother   . Hyperlipidemia Mother   . Diabetes Mellitus I Mother   . Coronary artery disease Father   . Hyperlipidemia Father   . Hypertension Father   . Cancer Sister     skin - non melanoma  . Hyperlipidemia Brother   . Cancer Maternal Uncle 72    pancreatic with mets to colon and prostate  . Cirrhosis Maternal Uncle   . Hypertension Maternal Grandmother   . Diabetes Mellitus I Maternal Grandmother   . Hyperlipidemia Maternal Grandmother   . CVA Maternal Grandmother   . Hypertension Maternal Grandfather   . Coronary artery disease Maternal Grandfather 65  . Hyperlipidemia Maternal Grandfather   .  Coronary artery disease Paternal Grandmother   . Hypertension Paternal Grandmother   . Hyperlipidemia Paternal Grandmother   . Diabetes Mellitus I Paternal Grandmother   . Hypertension Paternal Grandfather   . Hyperlipidemia Paternal Grandfather   . Coronary artery disease Paternal Grandfather     ALLERGIES:  is allergic to caine-1 [lidocaine]; flagyl [metronidazole]; adhesive [tape]; iron; oxycodone; penicillins; and sulfa antibiotics.  MEDICATIONS:  Current Outpatient Prescriptions  Medication Sig Dispense Refill  . Coenzyme Q10 (COQ-10) 100 MG CAPS Take 100 mg by mouth daily.    . diphenoxylate-atropine (LOMOTIL) 2.5-0.025 MG tablet TAKE ONE TABLET BY MOUTH FOUR TIMES DAILY AS NEEDED FOR DIARRHEA 30 tablet 1  . DULoxetine (CYMBALTA) 60 MG capsule Take 60 mg by mouth at bedtime.     Marland Kitchen esomeprazole (NEXIUM) 40 MG capsule Take 1 capsule (40 mg total) by mouth 2 (two) times daily before a meal. 60 capsule 5  . HYDROcodone-acetaminophen (NORCO) 7.5-325 MG tablet Take 1-2 tablets by mouth every 4 (four) hours as needed for moderate pain or severe pain. 40 tablet 0  . hyoscyamine (LEVBID) 0.375 MG 12 hr tablet Take 0.375 mg by mouth 2 (two) times daily.     Marland Kitchen lidocaine-prilocaine (EMLA) cream Apply to affected area once before port access (Patient taking differently: Apply 1 application topically See admin instructions. Apply to affected area once before port access) 30 g 3  . Melatonin 5 MG CAPS Take 10 mg by mouth at bedtime.    Marland Kitchen  meloxicam (MOBIC) 15 MG tablet Take 15 mg by mouth daily.     . methocarbamol (ROBAXIN) 500 MG tablet Take 2 tablets (1,000 mg total) by mouth every 6 (six) hours as needed for muscle spasms. (Patient taking differently: Take 500-1,000 mg by mouth every 8 (eight) hours as needed for muscle spasms. ) 20 tablet 3  . ondansetron (ZOFRAN) 8 MG tablet Take 1 tablet (8 mg total) by mouth 2 (two) times daily as needed for nausea or vomiting. Start the day after chemo for 2  days. Then take as needed for nausea or vomiting. 30 tablet 2  . promethazine (PHENERGAN) 25 MG tablet Take 25 mg by mouth every 8 (eight) hours as needed for nausea or vomiting.     . traMADol (ULTRAM) 50 MG tablet Take 50 mg by mouth 2 (two) times daily as needed for moderate pain. pain  2  . Vitamin D, Ergocalciferol, (DRISDOL) 50000 UNITS CAPS capsule Take 50,000 Units by mouth 2 (two) times a week.     No current facility-administered medications for this visit.    Facility-Administered Medications Ordered in Other Visits  Medication Dose Route Frequency Provider Last Rate Last Dose  . 0.9 %  sodium chloride infusion   Intravenous Once Truitt Merle, MD        REVIEW OF SYSTEMS:  Constitutional: Denies fevers, chills or abnormal night sweats Eyes: Denies blurriness of vision, double vision or watery eyes Ears, nose, mouth, throat, and face: Denies mucositis or sore throat Respiratory: Denies cough, dyspnea or wheezes Cardiovascular: Denies palpitation, chest discomfort or lower extremity swelling Gastrointestinal:(+) abdominal pain  Skin: Denies abnormal skin rashes Lymphatics: Denies new lymphadenopathy or easy bruising Neurological:Denies numbness, tingling or new weaknesses Behavioral/Psych: Mood is stable, no new changes  All other systems were reviewed with the patient and are negative.  PHYSICAL EXAMINATION: ECOG PERFORMANCE STATUS: 2  Vitals:   06/25/16 0952  BP: 131/68  Pulse: (!) 59  Resp: 18  Temp: 98.7 F (37.1 C)   Filed Weights   06/25/16 0952  Weight: 292 lb 8 oz (132.7 kg)    GENERAL:alert, no distress and comfortable SKIN: skin color, texture, turgor are normal, no rashes or significant lesions EYES: normal, conjunctiva are pink and non-injected, sclera clear OROPHARYNX:no exudate, no erythema and lips, buccal mucosa, and tongue normal  NECK: supple, thyroid normal size, non-tender, without nodularity LYMPH:  no palpable lymphadenopathy in the cervical,  axillary or inguinal LUNGS: clear to auscultation and percussion with normal breathing effort HEART: regular rate & rhythm and no murmurs and no lower extremity edema ABDOMEN:abdomen soft, normal bowel sounds. No organomegaly,  Surgical incision scar well healed in the right upper quadrant. No skin erythema or drainage. Musculoskeletal:no cyanosis of digits and no clubbing  PSYCH: alert & oriented x 3 with fluent speech NEURO: no focal motor/sensory deficits   LABORATORY DATA:  I have reviewed the data as listed CBC Latest Ref Rng & Units 06/25/2016 06/01/2016 05/30/2016  WBC 3.9 - 10.3 10e3/uL 6.9 11.8(H) 6.9  Hemoglobin 11.6 - 15.9 g/dL 11.5(L) 11.1(L) 13.1  Hematocrit 34.8 - 46.6 % 34.7(L) 33.3(L) 39.7  Platelets 145 - 400 10e3/uL 363 266 273    CMP Latest Ref Rng & Units 06/25/2016 06/01/2016 05/13/2016  Glucose 70 - 140 mg/dl 107 115(H) 86  BUN 7.0 - 26.0 mg/dL 10.6 8 12.1  Creatinine 0.6 - 1.1 mg/dL 0.7 0.58 0.8  Sodium 136 - 145 mEq/L 140 137 138  Potassium 3.5 - 5.1 mEq/L 4.1  4.0 4.2  Chloride 101 - 111 mmol/L - 106 -  CO2 22 - 29 mEq/L 24 25 22   Calcium 8.4 - 10.4 mg/dL 9.9 9.5 10.5(H)  Total Protein 6.4 - 8.3 g/dL 7.2 - 8.6(H)  Total Bilirubin 0.20 - 1.20 mg/dL 0.40 - 0.45  Alkaline Phos 40 - 150 U/L 82 - 107  AST 5 - 34 U/L 10 - 14  ALT 0 - 55 U/L 10 - 14   Results for NEZIAH, VOGELGESANG (MRN [id number]) as of 06/24/2016 10:12  Ref. Range 01/19/2016 08:11 03/25/2016 12:39 05/13/2016 12:33  CEA Latest Ref Range: 0.0 - 4.7 ng/mL 1.9 2.2 2.3  CEA ([facility]) Latest Ref Range: 0.00 - 5.00 ng/mL  1.67 2.07     PATHOLOGY REPORT Diagnosis 05/25/2015 1. Colon, segmental resection for tumor, recto-sigmoid INVASIVE COLONIC ADENOCARCINOMA (0.2 CM IN GREATEST DIMENSION RESIDUAL TUMOR) THE TUMOR INVADE SUBMUCOSA (0.3 CM IN DEPTH, PT1) MARGINS OF RESECTION ARE NEGATIVE FOR TUMOR TWELVE BENIGN LYMPH NODES (0/12) 2. Colon, resection margin (donut), proximal anastomotic  ring BENIGN COLONIC TISSUE, NEGATIVE FOR MALIGANCY 3. Colon, resection margin (donut), distal anastomotic ring BENIGN COLONIC TISSUE, NEGATIVE FOR MALIGNANCY 4. Ovary, right BENIGN SEROUS CYSTADENOMA.  Microscopic Comment 1. COLON AND RECTUM (INCLUDING TRANS-ANAL RESECTION): Specimen: Colon-rectum Procedure: Segmental resection Tumor site: Rectum anterior wall mid to distal third of rectum Specimen integrity: intact Macroscopic intactness of mesorectum: Complete: _ Macroscopic tumor perforation: Negative Invasive tumor: Maximum size: Histologic type(s): G2 Histologic grade and differentiation: G1: well differentiated/low grade G2: moderately differentiated/low grade G3: poorly differentiated/high grade G4: undifferentiated/high grade Type of polyp in which invasive carcinoma arose: _ Microscopic extension of invasive tumor:Submucosa Lymph-Vascular invasion: Not identified Peri-neural invasion: negative Tumor deposit(s) (discontinuous extramural extension): Negative Resection margins: Proximal margin: Negative Distal margin: Negative Circumferential (radial) (posterior ascending, posterior descending; lateral and posterior mid-rectum; and entire lower 1/3 rectum):Negatvie Mesenteric margin (sigmoid and transverse): Negative Distance closest margin (if all above margins negative): 2.5 cm Treatment effect (neo-adjuvant therapy): Present Additional polyp(s): Negative Non-neoplastic findings: Unremarkable Lymph nodes: number examined 12; number positive: 0 Pathologic Staging: T1, N0, M_ Ancillary studies: Preserved expression of the makor and minor MMR proteins MLH1, MSH2, MSH6 and PMS2. MLH1:    RADIOGRAPHIC STUDIES: I have personally reviewed the radiological images as listed and agreed with the findings in the report.  Ct Abdomen Pelvis W Contrast 01/20/2015    IMPRESSION: Right lateral rectal wall exophytic mass likely corresponding to the known neoplasm.  Two  ill-defined hepatic hypo enhancing lesions which appears to correspond to the lesions seen on the prior MRI. Please note evaluation of the liver is very limited due to streak artifact. If there is clinical concern for new metastatic disease MRI may provide better evaluation.  Small left ovarian cystic lesion corresponding to the lesion reported on the prior pelvic ultrasound. Ultrasound may provide better evaluation of the pelvic structures.   Electronically Signed   By: Anner Crete M.D.   On: 01/20/2015 16:15   Lower endoscopic ultrasound by Dr. Paulita Fujita 01/18/2015 FINDINGS: Normal digital rectal exam; could not palpate mass. In proximal rectum extending to the distal rectum, from 12 cm to 20 cm from the anal verge, a partial circumferential (50-75% circumferential distally, 75-100% circumferential proximall) mass was seen. Mass was firm, fixed, ulcerated, friable. EUS radial scope was able to traverse the tumor into normal-appearing distal sigmoid colon. Lesion invaded through the wall of the rectum into neighboring connective tissues in multiple regions. There were several round hypoechoic well-defined  malignant-appearing lymph nodes in the vicinity of the proximal portion of the tumor. STAGING: T3 N2 Mx via rectal ultrasound ENDOSCOPIC IMPRESSION: As above. Rectosigmoid mass, biopsy-proven adenocarcinoma, with local invasion noted under ultrasound. RECOMMENDATIONS: 1. Watch for potential complications of procedure. 2. Needs CT chest, abdomen, pelvis to complete staging. 3. Based already on rectal ultrasound results, patient would not be candidate for upfront surgery. She will need surgical and radiation oncology consultations. 4. Will discuss with Dr. Amedeo Plenty.  CT chest 02/07/2015 IMPRESSION: 1. No findings of metastatic disease to the chest.  CT abdomen and pelvis w contrast 02/21/2016 IMPRESSION: 1. Interval removal of surgical drains since 12/19/2015 from the presacral space. Similar size  of ill-defined presacral fluid and gas, for which residual abscess cannot be excluded. Similar amount of intraperitoneal edema throughout the upper pelvis with foci of extraluminal gas, possibly related to the presacral process. 2. Diverting right-sided ileostomy, without acute complication. 3. Similar moderate hydroureteronephrosis, likely due to the pelvic process. 4. Possible bladder wall thickening. Correlate with urinalysis. This appearance could be partially due to underdistention. 5. Geographic hepatic steatosis and hemangiomas. Cannot exclude a new right hepatic lobe lesion. Consider nonemergent pre and post contrast outpatient MRI. This could represent a new area of focal steatosis.    ASSESSMENT & PLAN: Lindsay Little is a 50 y.o. female with chronic abdominal pain with rectal mass on colonoscopy found to be invasive rectal adenocarcinoma.   1. T3N2M0, Stage IIIC rectal adenocarcinoma, ypT1N0 after neoadjuvant chemotherapy and radiation -I previously reviewed her surgical pathology result in great detail with patient and her husband. She has had a good response to neoadjuvant chemoradiation, has a small residual tumor on the surgical specimen, no lymph node metastasis. -I recommended adjuvant chemotherapy, however she has not been able to take it due to the pelvic abscess after surgery and a prolonged recovery. - We'll continue her surveillance. She had multiple CT of the abdomen and pelvis after surgery, last one in August 2017, which did not how evidence of recurrence. Her tumor marker CEA has been normal. -The patient had a colostomy revision on 05/31/16, she had a colonoscopy prior to this, which was unremarkable except a fe polyps  -She has overall improved lately, and IV fluids are not required at this time. -I reviewed her lab results with her, both CBC and CMP are unremarkable, she is clinically doing better, no clinical concern of cancer recurrence. Iron and tumor marker  pending  -We'll continue colon cancer surveillance, next CT scan in 3 months.  2. Intermittent abdominal pain and diarrhea  -She unfortunately developed a recurrent pelvic abscess after her surgery,  managed by her surgeon Dr. Johney Maine, she is status post ileostomy, draining tube has been removed. This has resolved now. -I discussed a referral for PT in regards to pelvic exercises. The patient is interested in this and therefore I made a referral. -I advised the patient to alternate Lomotil and Imodium and use it as needed for adequate control of her diarrhea   3. Microcytic anemia, secondary to GI blood loss and iron deficiency anemia -improved anemia since surgery. -We'll continue monitoring her iron level  4. HTN -She'll continue follow-up with her primary care physician  6.  Anorexia and depression -She tried mirtazapine, did not help. - I previously encouraged her to take nutrition supplement -Overall improved lately.   Plan: -Follow up in 3 months -Restaging CT scan before the next visit. -Referral to pelvic PT.  I spent 25 mins for the visit, >  50% face to face discussion.   Truitt Merle  06/25/2016   This document serves as a record of services personally performed by Truitt Merle, MD. It was created on her behalf by Darcus Austin, a trained medical scribe. The creation of this record is based on the scribe's personal observations and the provider's statements to them. This document has been checked and approved by the attending provider.

## 2016-06-25 ENCOUNTER — Ambulatory Visit (HOSPITAL_BASED_OUTPATIENT_CLINIC_OR_DEPARTMENT_OTHER): Payer: No Typology Code available for payment source | Admitting: Hematology

## 2016-06-25 ENCOUNTER — Other Ambulatory Visit (HOSPITAL_BASED_OUTPATIENT_CLINIC_OR_DEPARTMENT_OTHER): Payer: No Typology Code available for payment source

## 2016-06-25 ENCOUNTER — Encounter: Payer: Self-pay | Admitting: Hematology

## 2016-06-25 ENCOUNTER — Telehealth: Payer: Self-pay | Admitting: Hematology

## 2016-06-25 ENCOUNTER — Ambulatory Visit: Payer: No Typology Code available for payment source

## 2016-06-25 VITALS — BP 131/68 | HR 59 | Temp 98.7°F | Resp 18 | Ht 70.5 in | Wt 292.5 lb

## 2016-06-25 DIAGNOSIS — R109 Unspecified abdominal pain: Secondary | ICD-10-CM

## 2016-06-25 DIAGNOSIS — C20 Malignant neoplasm of rectum: Secondary | ICD-10-CM

## 2016-06-25 DIAGNOSIS — Z6841 Body Mass Index (BMI) 40.0 and over, adult: Secondary | ICD-10-CM

## 2016-06-25 DIAGNOSIS — K922 Gastrointestinal hemorrhage, unspecified: Secondary | ICD-10-CM

## 2016-06-25 DIAGNOSIS — D500 Iron deficiency anemia secondary to blood loss (chronic): Secondary | ICD-10-CM

## 2016-06-25 DIAGNOSIS — R630 Anorexia: Secondary | ICD-10-CM

## 2016-06-25 DIAGNOSIS — D638 Anemia in other chronic diseases classified elsewhere: Secondary | ICD-10-CM

## 2016-06-25 DIAGNOSIS — R197 Diarrhea, unspecified: Secondary | ICD-10-CM

## 2016-06-25 DIAGNOSIS — F329 Major depressive disorder, single episode, unspecified: Secondary | ICD-10-CM

## 2016-06-25 DIAGNOSIS — F32A Depression, unspecified: Secondary | ICD-10-CM

## 2016-06-25 DIAGNOSIS — I10 Essential (primary) hypertension: Secondary | ICD-10-CM

## 2016-06-25 LAB — COMPREHENSIVE METABOLIC PANEL
ALBUMIN: 3.3 g/dL — AB (ref 3.5–5.0)
ALK PHOS: 82 U/L (ref 40–150)
ALT: 10 U/L (ref 0–55)
AST: 10 U/L (ref 5–34)
Anion Gap: 9 mEq/L (ref 3–11)
BILIRUBIN TOTAL: 0.4 mg/dL (ref 0.20–1.20)
BUN: 10.6 mg/dL (ref 7.0–26.0)
CALCIUM: 9.9 mg/dL (ref 8.4–10.4)
CO2: 24 mEq/L (ref 22–29)
Chloride: 106 mEq/L (ref 98–109)
Creatinine: 0.7 mg/dL (ref 0.6–1.1)
EGFR: >90 mL/min/{1.73_m2} (ref >90)
Glucose: 107 mg/dl (ref 70–140)
POTASSIUM: 4.1 meq/L (ref 3.5–5.1)
Sodium: 140 mEq/L (ref 136–145)
TOTAL PROTEIN: 7.2 g/dL (ref 6.4–8.3)

## 2016-06-25 LAB — CBC WITH DIFFERENTIAL/PLATELET
BASO%: 1.5 % (ref 0.0–2.0)
BASOS ABS: 0.1 10*3/uL (ref 0.0–0.1)
EOS ABS: 0.4 10*3/uL (ref 0.0–0.5)
EOS%: 6.2 % (ref 0.0–7.0)
HCT: 34.7 % — ABNORMAL LOW (ref 34.8–46.6)
HGB: 11.5 g/dL — ABNORMAL LOW (ref 11.6–15.9)
LYMPH#: 1.1 10*3/uL (ref 0.9–3.3)
LYMPH%: 15.3 % (ref 14.0–49.7)
MCH: 28 pg (ref 25.1–34.0)
MCHC: 33.2 g/dL (ref 31.5–36.0)
MCV: 84.2 fL (ref 79.5–101.0)
MONO#: 0.5 10*3/uL (ref 0.1–0.9)
MONO%: 6.6 % (ref 0.0–14.0)
NEUT%: 70.4 % (ref 38.4–76.8)
NEUTROS ABS: 4.9 10*3/uL (ref 1.5–6.5)
Platelets: 363 10*3/uL (ref 145–400)
RBC: 4.13 10*6/uL (ref 3.70–5.45)
RDW: 14 % (ref 11.2–14.5)
WBC: 6.9 10*3/uL (ref 3.9–10.3)

## 2016-06-25 LAB — IRON AND TIBC
%SAT: 14 % — AB (ref 21–57)
IRON: 35 ug/dL — AB (ref 41–142)
TIBC: 258 ug/dL (ref 236–444)
UIBC: 222 ug/dL (ref 120–384)

## 2016-06-25 LAB — CEA (IN HOUSE-CHCC): CEA (CHCC-In House): 1.73 ng/mL (ref 0.00–5.00)

## 2016-06-25 LAB — MAGNESIUM: Magnesium: 2 mg/dl (ref 1.5–2.5)

## 2016-06-25 NOTE — Telephone Encounter (Signed)
Follow up appointments scheduled per 06/25/16 los. A copy of the AVS report and appointment schedule was given to the patient, per 06/25/16 los.

## 2016-06-27 ENCOUNTER — Ambulatory Visit: Payer: No Typology Code available for payment source | Admitting: Physical Therapy

## 2016-06-27 ENCOUNTER — Telehealth: Payer: Self-pay | Admitting: *Deleted

## 2016-06-27 ENCOUNTER — Telehealth: Payer: Self-pay | Admitting: Hematology

## 2016-06-27 NOTE — Telephone Encounter (Signed)
sw pt to confirm Dec appt date/times per LOS

## 2016-06-27 NOTE — Telephone Encounter (Signed)
Telephone call to patient to discuss Iron and TIBC results. Pt is familiar with feraheme transfusions and would like to be set up with an appointment for this transfusion per Dr. Burr Medico.  Message forwarded to collab nurse to schedule.

## 2016-07-01 ENCOUNTER — Other Ambulatory Visit: Payer: Self-pay | Admitting: Hematology

## 2016-07-04 ENCOUNTER — Other Ambulatory Visit (HOSPITAL_COMMUNITY): Payer: Self-pay | Admitting: Surgery

## 2016-07-04 ENCOUNTER — Ambulatory Visit (HOSPITAL_COMMUNITY)
Admission: RE | Admit: 2016-07-04 | Discharge: 2016-07-04 | Disposition: A | Discharge status: Home / Self Care | Payer: No Typology Code available for payment source | Source: Ambulatory Visit | Attending: Surgery | Admitting: Surgery

## 2016-07-04 ENCOUNTER — Other Ambulatory Visit (HOSPITAL_COMMUNITY)
Admission: RE | Admit: 2016-07-04 | Discharge: 2016-07-04 | Disposition: A | Discharge status: Home / Self Care | Payer: No Typology Code available for payment source | Source: Ambulatory Visit | Attending: Surgery | Admitting: Surgery

## 2016-07-04 DIAGNOSIS — C20 Malignant neoplasm of rectum: Secondary | ICD-10-CM | POA: Diagnosis not present

## 2016-07-04 DIAGNOSIS — Z9049 Acquired absence of other specified parts of digestive tract: Secondary | ICD-10-CM | POA: Insufficient documentation

## 2016-07-04 LAB — CBC WITH DIFFERENTIAL/PLATELET
BASOS ABS: 0 10*3/uL (ref 0.0–0.1)
Basophils Relative: 0 %
EOS ABS: 0.3 10*3/uL (ref 0.0–0.7)
EOS PCT: 3 %
HCT: 35.6 % — ABNORMAL LOW (ref 36.0–46.0)
Hemoglobin: 11.5 g/dL — ABNORMAL LOW (ref 12.0–15.0)
LYMPHS PCT: 15 %
Lymphs Abs: 1.5 10*3/uL (ref 0.7–4.0)
MCH: 27.4 pg (ref 26.0–34.0)
MCHC: 32.3 g/dL (ref 30.0–36.0)
MCV: 84.8 fL (ref 78.0–100.0)
MONO ABS: 1.2 10*3/uL — AB (ref 0.1–1.0)
Monocytes Relative: 12 %
Neutro Abs: 7 10*3/uL (ref 1.7–7.7)
Neutrophils Relative %: 70 %
PLATELETS: 379 10*3/uL (ref 150–400)
RBC: 4.2 MIL/uL (ref 3.87–5.11)
RDW: 13.3 % (ref 11.5–15.5)
WBC: 10 10*3/uL (ref 4.0–10.5)

## 2016-07-04 LAB — COMPREHENSIVE METABOLIC PANEL
ALT: 19 U/L (ref 14–54)
AST: 17 U/L (ref 15–41)
Albumin: 3.5 g/dL (ref 3.5–5.0)
Alkaline Phosphatase: 90 U/L (ref 38–126)
Anion gap: 8 (ref 5–15)
BUN: 10 mg/dL (ref 6–20)
CHLORIDE: 102 mmol/L (ref 101–111)
CO2: 27 mmol/L (ref 22–32)
Calcium: 9.5 mg/dL (ref 8.9–10.3)
Creatinine, Ser: 0.64 mg/dL (ref 0.44–1.00)
Glucose, Bld: 96 mg/dL (ref 65–99)
POTASSIUM: 4.1 mmol/L (ref 3.5–5.1)
SODIUM: 137 mmol/L (ref 135–145)
Total Bilirubin: 0.5 mg/dL (ref 0.3–1.2)
Total Protein: 7.2 g/dL (ref 6.5–8.1)

## 2016-07-04 MED ORDER — IOPAMIDOL (ISOVUE-300) INJECTION 61%
INTRAVENOUS | Status: AC
  Filled 2016-07-04: qty 30

## 2016-07-04 MED ORDER — IOPAMIDOL (ISOVUE-300) INJECTION 61%
INTRAVENOUS | Status: AC
  Filled 2016-07-04: qty 100

## 2016-07-04 MED ORDER — IOPAMIDOL (ISOVUE-300) INJECTION 61%
100.0000 mL | Freq: Once | INTRAVENOUS | Status: AC | PRN
  Administered 2016-07-04: 100 mL via INTRAVENOUS

## 2016-07-04 MED ORDER — IOPAMIDOL (ISOVUE-300) INJECTION 61%
30.0000 mL | Freq: Once | INTRAVENOUS | Status: AC | PRN
  Administered 2016-07-04: 30 mL via ORAL

## 2016-07-05 ENCOUNTER — Other Ambulatory Visit: Payer: Self-pay | Admitting: Hematology

## 2016-07-05 ENCOUNTER — Ambulatory Visit (HOSPITAL_BASED_OUTPATIENT_CLINIC_OR_DEPARTMENT_OTHER): Payer: No Typology Code available for payment source

## 2016-07-05 ENCOUNTER — Ambulatory Visit: Payer: Self-pay | Admitting: Surgery

## 2016-07-05 VITALS — BP 116/72 | HR 93 | Temp 98.4°F | Resp 18

## 2016-07-05 DIAGNOSIS — D500 Iron deficiency anemia secondary to blood loss (chronic): Secondary | ICD-10-CM | POA: Diagnosis not present

## 2016-07-05 DIAGNOSIS — K922 Gastrointestinal hemorrhage, unspecified: Secondary | ICD-10-CM

## 2016-07-05 DIAGNOSIS — D638 Anemia in other chronic diseases classified elsewhere: Secondary | ICD-10-CM

## 2016-07-05 MED ORDER — SODIUM CHLORIDE 0.9 % IV SOLN
510.0000 mg | Freq: Once | INTRAVENOUS | Status: AC
  Administered 2016-07-05: 510 mg via INTRAVENOUS
  Filled 2016-07-05: qty 17

## 2016-07-05 MED ORDER — SODIUM CHLORIDE 0.9 % IJ SOLN
10.0000 mL | INTRAMUSCULAR | Status: DC | PRN
  Administered 2016-07-05: 10 mL
  Filled 2016-07-05: qty 10

## 2016-07-05 MED ORDER — HEPARIN SOD (PORK) LOCK FLUSH 100 UNIT/ML IV SOLN
500.0000 [IU] | Freq: Once | INTRAVENOUS | Status: AC | PRN
  Administered 2016-07-05: 500 [IU]
  Filled 2016-07-05: qty 5

## 2016-07-05 MED FILL — HYDROCODON-APAP 7.5-325: 7.5-325 | 5 days supply | Qty: 40 | Fill #0

## 2016-07-05 NOTE — Patient Instructions (Signed)
Ferumoxytol injection What is this medicine? FERUMOXYTOL is an iron complex. Iron is used to make healthy red blood cells, which carry oxygen and nutrients throughout the body. This medicine is used to treat iron deficiency anemia in people with chronic kidney disease. COMMON BRAND NAME(S): Feraheme What should I tell my health care provider before I take this medicine? They need to know if you have any of these conditions: -anemia not caused by low iron levels -high levels of iron in the blood -magnetic resonance imaging (MRI) test scheduled -an unusual or allergic reaction to iron, other medicines, foods, dyes, or preservatives -pregnant or trying to get pregnant -breast-feeding How should I use this medicine? This medicine is for injection into a vein. It is given by a health care professional in a hospital or clinic setting. Talk to your pediatrician regarding the use of this medicine in children. Special care may be needed. What if I miss a dose? It is important not to miss your dose. Call your doctor or health care professional if you are unable to keep an appointment. What may interact with this medicine? This medicine may interact with the following medications: -other iron products What should I watch for while using this medicine? Visit your doctor or healthcare professional regularly. Tell your doctor or healthcare professional if your symptoms do not start to get better or if they get worse. You may need blood work done while you are taking this medicine. You may need to follow a special diet. Talk to your doctor. Foods that contain iron include: whole grains/cereals, dried fruits, beans, or peas, leafy green vegetables, and organ meats (liver, kidney). What side effects may I notice from receiving this medicine? Side effects that you should report to your doctor or health care professional as soon as possible: -allergic reactions like skin rash, itching or hives, swelling of the  face, lips, or tongue -breathing problems -changes in blood pressure -feeling faint or lightheaded, falls -fever or chills -flushing, sweating, or hot feelings -swelling of the ankles or feet Side effects that usually do not require medical attention (report to your doctor or health care professional if they continue or are bothersome): -diarrhea -headache -nausea, vomiting -stomach pain Where should I keep my medicine? This drug is given in a hospital or clinic and will not be stored at home.  2017 [employer] (2015-08-03 12:41:49)  

## 2016-07-10 ENCOUNTER — Ambulatory Visit: Payer: Self-pay | Admitting: Surgery

## 2016-07-10 ENCOUNTER — Other Ambulatory Visit: Payer: Self-pay | Admitting: Surgery

## 2016-07-10 ENCOUNTER — Inpatient Hospital Stay (HOSPITAL_COMMUNITY)
Admission: AD | Admit: 2016-07-10 | Discharge: 2016-07-17 | DRG: 330 | Disposition: A | Discharge status: Home / Self Care | Payer: No Typology Code available for payment source | Source: Ambulatory Visit | Attending: Surgery | Admitting: Surgery

## 2016-07-10 DIAGNOSIS — Z85048 Personal history of other malignant neoplasm of rectum, rectosigmoid junction, and anus: Secondary | ICD-10-CM

## 2016-07-10 DIAGNOSIS — Z90710 Acquired absence of both cervix and uterus: Secondary | ICD-10-CM

## 2016-07-10 DIAGNOSIS — Z9221 Personal history of antineoplastic chemotherapy: Secondary | ICD-10-CM | POA: Diagnosis not present

## 2016-07-10 DIAGNOSIS — Z880 Allergy status to penicillin: Secondary | ICD-10-CM

## 2016-07-10 DIAGNOSIS — D649 Anemia, unspecified: Secondary | ICD-10-CM | POA: Diagnosis present

## 2016-07-10 DIAGNOSIS — K50911 Crohn's disease, unspecified, with rectal bleeding: Secondary | ICD-10-CM | POA: Diagnosis present

## 2016-07-10 DIAGNOSIS — C20 Malignant neoplasm of rectum: Secondary | ICD-10-CM | POA: Diagnosis present

## 2016-07-10 DIAGNOSIS — Z9049 Acquired absence of other specified parts of digestive tract: Secondary | ICD-10-CM

## 2016-07-10 DIAGNOSIS — Z90721 Acquired absence of ovaries, unilateral: Secondary | ICD-10-CM | POA: Diagnosis not present

## 2016-07-10 DIAGNOSIS — K660 Peritoneal adhesions (postprocedural) (postinfection): Secondary | ICD-10-CM | POA: Diagnosis present

## 2016-07-10 DIAGNOSIS — Z923 Personal history of irradiation: Secondary | ICD-10-CM | POA: Diagnosis not present

## 2016-07-10 DIAGNOSIS — N824 Other female intestinal-genital tract fistulae: Secondary | ICD-10-CM | POA: Insufficient documentation

## 2016-07-10 DIAGNOSIS — Z800 Family history of malignant neoplasm of digestive organs: Secondary | ICD-10-CM | POA: Diagnosis not present

## 2016-07-10 DIAGNOSIS — K567 Ileus, unspecified: Secondary | ICD-10-CM | POA: Diagnosis not present

## 2016-07-10 DIAGNOSIS — K604 Rectal fistula, unspecified: Secondary | ICD-10-CM

## 2016-07-10 DIAGNOSIS — M797 Fibromyalgia: Secondary | ICD-10-CM | POA: Diagnosis present

## 2016-07-10 DIAGNOSIS — I10 Essential (primary) hypertension: Secondary | ICD-10-CM | POA: Diagnosis present

## 2016-07-10 DIAGNOSIS — K219 Gastro-esophageal reflux disease without esophagitis: Secondary | ICD-10-CM | POA: Diagnosis present

## 2016-07-10 DIAGNOSIS — L03317 Cellulitis of buttock: Secondary | ICD-10-CM | POA: Diagnosis present

## 2016-07-10 DIAGNOSIS — K611 Rectal abscess: Secondary | ICD-10-CM | POA: Diagnosis present

## 2016-07-10 DIAGNOSIS — K449 Diaphragmatic hernia without obstruction or gangrene: Secondary | ICD-10-CM | POA: Diagnosis present

## 2016-07-10 DIAGNOSIS — N823 Fistula of vagina to large intestine: Principal | ICD-10-CM | POA: Diagnosis present

## 2016-07-10 DIAGNOSIS — M19012 Primary osteoarthritis, left shoulder: Secondary | ICD-10-CM | POA: Diagnosis present

## 2016-07-10 DIAGNOSIS — Z87891 Personal history of nicotine dependence: Secondary | ICD-10-CM

## 2016-07-10 DIAGNOSIS — F329 Major depressive disorder, single episode, unspecified: Secondary | ICD-10-CM | POA: Diagnosis present

## 2016-07-10 DIAGNOSIS — E66813 Obesity, class 3: Secondary | ICD-10-CM

## 2016-07-10 DIAGNOSIS — M1712 Unilateral primary osteoarthritis, left knee: Secondary | ICD-10-CM | POA: Diagnosis present

## 2016-07-10 DIAGNOSIS — R102 Pelvic and perineal pain: Secondary | ICD-10-CM | POA: Diagnosis present

## 2016-07-10 DIAGNOSIS — Z933 Colostomy status: Secondary | ICD-10-CM

## 2016-07-10 DIAGNOSIS — K9189 Other postprocedural complications and disorders of digestive system: Secondary | ICD-10-CM | POA: Diagnosis present

## 2016-07-10 DIAGNOSIS — Z6841 Body Mass Index (BMI) 40.0 and over, adult: Secondary | ICD-10-CM

## 2016-07-10 HISTORY — DX: Other female intestinal-genital tract fistulae: N82.4

## 2016-07-10 HISTORY — DX: Rectal fistula: K60.4

## 2016-07-10 HISTORY — DX: Rectal fistula, unspecified: K60.40

## 2016-07-10 LAB — CBC WITH DIFFERENTIAL/PLATELET
BASOS ABS: 0 10*3/uL (ref 0.0–0.1)
Basophils Relative: 0 %
Eosinophils Absolute: 0.3 10*3/uL (ref 0.0–0.7)
Eosinophils Relative: 4 %
HEMATOCRIT: 29.9 % — AB (ref 36.0–46.0)
Hemoglobin: 9.4 g/dL — ABNORMAL LOW (ref 12.0–15.0)
LYMPHS ABS: 1.1 10*3/uL (ref 0.7–4.0)
LYMPHS PCT: 14 %
MCH: 26.5 pg (ref 26.0–34.0)
MCHC: 31.4 g/dL (ref 30.0–36.0)
MCV: 84.2 fL (ref 78.0–100.0)
MONO ABS: 0.4 10*3/uL (ref 0.1–1.0)
MONOS PCT: 5 %
NEUTROS ABS: 6.1 10*3/uL (ref 1.7–7.7)
Neutrophils Relative %: 77 %
Platelets: 399 10*3/uL (ref 150–400)
RBC: 3.55 MIL/uL — ABNORMAL LOW (ref 3.87–5.11)
RDW: 13.8 % (ref 11.5–15.5)
WBC: 7.9 10*3/uL (ref 4.0–10.5)

## 2016-07-10 LAB — COMPREHENSIVE METABOLIC PANEL
ALBUMIN: 3 g/dL — AB (ref 3.5–5.0)
ALT: 12 U/L — ABNORMAL LOW (ref 14–54)
ANION GAP: 10 (ref 5–15)
AST: 18 U/L (ref 15–41)
Alkaline Phosphatase: 66 U/L (ref 38–126)
BUN: 13 mg/dL (ref 6–20)
CHLORIDE: 105 mmol/L (ref 101–111)
CO2: 25 mmol/L (ref 22–32)
Calcium: 9 mg/dL (ref 8.9–10.3)
Creatinine, Ser: 0.65 mg/dL (ref 0.44–1.00)
GFR calc Af Amer: >60 mL/min (ref >60)
GFR calc non Af Amer: >60 mL/min (ref >60)
GLUCOSE: 145 mg/dL — AB (ref 65–99)
POTASSIUM: 3.8 mmol/L (ref 3.5–5.1)
Sodium: 140 mmol/L (ref 135–145)
Total Bilirubin: 0.3 mg/dL (ref 0.3–1.2)
Total Protein: 6.5 g/dL (ref 6.5–8.1)

## 2016-07-10 MED ORDER — LIDOCAINE-PRILOCAINE 2.5-2.5 % EX CREA: 1.0000 "application " | TOPICAL_CREAM | Freq: Once | CUTANEOUS | Status: DC | PRN

## 2016-07-10 MED ORDER — HYDROMORPHONE HCL 2 MG/ML IJ SOLN: 0.5000 mg | INTRAMUSCULAR | Status: DC | PRN

## 2016-07-10 MED ORDER — ENOXAPARIN SODIUM 40 MG/0.4ML ~~LOC~~ SOLN
40.0000 mg | Freq: Once | SUBCUTANEOUS | Status: AC
  Administered 2016-07-12: 40 mg via SUBCUTANEOUS
  Filled 2016-07-10: qty 0.4

## 2016-07-10 MED ORDER — ENOXAPARIN SODIUM 30 MG/0.3ML ~~LOC~~ SOLN
30.0000 mg | Freq: Two times a day (BID) | SUBCUTANEOUS | Status: AC
  Administered 2016-07-10 – 2016-07-11 (×3): 30 mg via SUBCUTANEOUS
  Filled 2016-07-10 (×2): qty 0.3

## 2016-07-10 MED ORDER — PHENOL 1.4 % MT LIQD: 2.0000 | OROMUCOSAL | Status: DC | PRN

## 2016-07-10 MED ORDER — ALVIMOPAN 12 MG PO CAPS
12.0000 mg | ORAL_CAPSULE | Freq: Once | ORAL | Status: AC
  Administered 2016-07-12: 12 mg via ORAL
  Filled 2016-07-10: qty 1

## 2016-07-10 MED ORDER — ENOXAPARIN SODIUM 150 MG/ML ~~LOC~~ SOLN: 40.0000 mg | SUBCUTANEOUS | Status: DC

## 2016-07-10 MED ORDER — DULOXETINE HCL 60 MG PO CPEP
60.0000 mg | ORAL_CAPSULE | Freq: Every day | ORAL | Status: DC
  Administered 2016-07-10 – 2016-07-11 (×2): 60 mg via ORAL
  Filled 2016-07-10 (×2): qty 1

## 2016-07-10 MED ORDER — METOCLOPRAMIDE HCL 5 MG/ML IJ SOLN: 5.0000 mg | Freq: Four times a day (QID) | INTRAMUSCULAR | Status: DC | PRN

## 2016-07-10 MED ORDER — LACTATED RINGERS IV SOLN: INTRAVENOUS | Status: DC

## 2016-07-10 MED ORDER — GABAPENTIN 300 MG PO CAPS: 300.0000 mg | ORAL_CAPSULE | ORAL | Status: DC

## 2016-07-10 MED ORDER — CELECOXIB 400 MG PO CAPS: 400.0000 mg | ORAL_CAPSULE | ORAL | Status: DC

## 2016-07-10 MED ORDER — LACTATED RINGERS IV SOLN
INTRAVENOUS | Status: DC
  Administered 2016-07-10: 20:00:00 via INTRAVENOUS
  Administered 2016-07-11: 100 mL/h via INTRAVENOUS
  Administered 2016-07-12 (×2): via INTRAVENOUS

## 2016-07-10 MED ORDER — NEOMYCIN SULFATE 500 MG PO TABS: 1000.0000 mg | ORAL_TABLET | ORAL | Status: DC

## 2016-07-10 MED ORDER — LIP MEDEX EX OINT: 1.0000 "application " | TOPICAL_OINTMENT | Freq: Two times a day (BID) | CUTANEOUS | Status: DC

## 2016-07-10 MED ORDER — GENTAMICIN SULFATE 40 MG/ML IJ SOLN
5.0000 mg/kg | INTRAVENOUS | Status: AC
  Administered 2016-07-12: 471.6 mg via INTRAVENOUS
  Filled 2016-07-10 (×2): qty 11.75

## 2016-07-10 MED ORDER — ACETAMINOPHEN 650 MG RE SUPP: 650.0000 mg | Freq: Four times a day (QID) | RECTAL | Status: DC | PRN

## 2016-07-10 MED ORDER — ACETAMINOPHEN 500 MG PO TABS: 1000.0000 mg | ORAL_TABLET | ORAL | Status: DC

## 2016-07-10 MED ORDER — PROCHLORPERAZINE EDISYLATE 5 MG/ML IJ SOLN: 5.0000 mg | INTRAMUSCULAR | Status: DC | PRN

## 2016-07-10 MED ORDER — METRONIDAZOLE 500 MG PO TABS: 1000.0000 mg | ORAL_TABLET | ORAL | Status: DC

## 2016-07-10 MED ORDER — LACTATED RINGERS IV BOLUS (SEPSIS): 1000.0000 mL | Freq: Three times a day (TID) | INTRAVENOUS | Status: AC | PRN

## 2016-07-10 MED ORDER — PROMETHAZINE HCL 25 MG PO TABS
25.0000 mg | ORAL_TABLET | Freq: Three times a day (TID) | ORAL | Status: DC | PRN
Start: 2016-07-10 — End: 2016-07-17
  Administered 2016-07-11 (×2): 25 mg via ORAL
  Filled 2016-07-10 (×2): qty 1

## 2016-07-10 MED ORDER — ACETAMINOPHEN 500 MG PO TABS
1000.0000 mg | ORAL_TABLET | ORAL | Status: AC
Start: 2016-07-12 — End: 2016-07-12
  Administered 2016-07-12: 1000 mg via ORAL
  Filled 2016-07-10: qty 2

## 2016-07-10 MED ORDER — SODIUM CHLORIDE 0.9 % IV SOLN: 4.0000 mg | Freq: Four times a day (QID) | INTRAVENOUS | Status: DC | PRN

## 2016-07-10 MED ORDER — VITAMIN D (ERGOCALCIFEROL) 1.25 MG (50000 UNIT) PO CAPS
50000.0000 [IU] | ORAL_CAPSULE | ORAL | Status: DC
  Administered 2016-07-16: 50000 [IU] via ORAL
  Filled 2016-07-10 (×2): qty 1

## 2016-07-10 MED ORDER — MAGIC MOUTHWASH: 15.0000 mL | Freq: Four times a day (QID) | ORAL | Status: DC | PRN

## 2016-07-10 MED ORDER — LACTATED RINGERS IV BOLUS (SEPSIS)
1000.0000 mL | Freq: Once | INTRAVENOUS | Status: AC
  Administered 2016-07-10: 1000 mL via INTRAVENOUS

## 2016-07-10 MED ORDER — ERTAPENEM SODIUM 1 G IJ SOLR: 1.0000 g | INTRAMUSCULAR | Status: DC

## 2016-07-10 MED ORDER — MENTHOL 3 MG MT LOZG
1.0000 | LOZENGE | OROMUCOSAL | Status: DC | PRN
  Filled 2016-07-10: qty 9

## 2016-07-10 MED ORDER — METHOCARBAMOL 500 MG PO TABS
500.0000 mg | ORAL_TABLET | Freq: Three times a day (TID) | ORAL | Status: DC | PRN
  Administered 2016-07-14: 500 mg via ORAL
  Administered 2016-07-15 – 2016-07-16 (×3): 1000 mg via ORAL
  Filled 2016-07-10: qty 1
  Filled 2016-07-10 (×3): qty 2

## 2016-07-10 MED ORDER — HYDROCODONE-ACETAMINOPHEN 7.5-325 MG PO TABS
1.0000 | ORAL_TABLET | ORAL | Status: DC | PRN
  Administered 2016-07-10 – 2016-07-17 (×18): 2 via ORAL
  Filled 2016-07-10 (×19): qty 2

## 2016-07-10 MED ORDER — CLINDAMYCIN PHOSPHATE 900 MG/50ML IV SOLN
900.0000 mg | INTRAVENOUS | Status: AC
  Administered 2016-07-12: 900 mg via INTRAVENOUS

## 2016-07-10 MED ORDER — GENTAMICIN SULFATE 40 MG/ML IJ SOLN
INTRAMUSCULAR | Status: DC
  Filled 2016-07-10 (×2): qty 6

## 2016-07-10 MED ORDER — BUPIVACAINE LIPOSOME 1.3 % IJ SUSP
20.0000 mL | INTRAMUSCULAR | Status: DC
  Filled 2016-07-10: qty 20

## 2016-07-10 MED ORDER — ACETAMINOPHEN 500 MG PO TABS
1000.0000 mg | ORAL_TABLET | Freq: Three times a day (TID) | ORAL | Status: DC
  Administered 2016-07-11: 1000 mg via ORAL
  Filled 2016-07-10 (×2): qty 2

## 2016-07-10 MED ORDER — PANTOPRAZOLE SODIUM 40 MG PO TBEC
80.0000 mg | DELAYED_RELEASE_TABLET | Freq: Every day | ORAL | Status: DC
  Administered 2016-07-11 – 2016-07-17 (×6): 80 mg via ORAL
  Filled 2016-07-10 (×7): qty 2

## 2016-07-10 MED ORDER — SIMETHICONE 80 MG PO CHEW: 40.0000 mg | CHEWABLE_TABLET | Freq: Four times a day (QID) | ORAL | Status: DC | PRN

## 2016-07-10 MED ORDER — HYDRALAZINE HCL 20 MG/ML IJ SOLN: 10.0000 mg | INTRAMUSCULAR | Status: DC | PRN

## 2016-07-10 MED ORDER — METHOCARBAMOL 1000 MG/10ML IJ SOLN
1000.0000 mg | Freq: Four times a day (QID) | INTRAMUSCULAR | Status: DC | PRN
Start: 2016-07-10 — End: 2016-07-11

## 2016-07-10 MED ORDER — ACETAMINOPHEN 325 MG PO TABS: 650.0000 mg | ORAL_TABLET | Freq: Four times a day (QID) | ORAL | Status: DC | PRN

## 2016-07-10 MED ORDER — GABAPENTIN 300 MG PO CAPS
300.0000 mg | ORAL_CAPSULE | ORAL | Status: AC
  Administered 2016-07-12: 300 mg via ORAL
  Filled 2016-07-10: qty 1

## 2016-07-10 MED ORDER — VITAMIN C 250 MG PO TABS: 500.0000 mg | ORAL_TABLET | Freq: Two times a day (BID) | ORAL | Status: DC

## 2016-07-10 MED ORDER — LACTATED RINGERS IV BOLUS (SEPSIS): 1000.0000 mL | Freq: Once | INTRAVENOUS | Status: DC

## 2016-07-10 MED ORDER — ALUM & MAG HYDROXIDE-SIMETH 200-200-20 MG/5ML PO SUSP: 30.0000 mL | Freq: Four times a day (QID) | ORAL | Status: DC | PRN

## 2016-07-10 MED ORDER — NEOMYCIN SULFATE 500 MG PO TABS
1000.0000 mg | ORAL_TABLET | ORAL | Status: AC
  Administered 2016-07-11 (×3): 1000 mg via ORAL
  Filled 2016-07-10 (×3): qty 2

## 2016-07-10 MED ORDER — ONDANSETRON HCL 4 MG/2ML IJ SOLN
4.0000 mg | Freq: Four times a day (QID) | INTRAMUSCULAR | Status: DC | PRN
Start: 2016-07-10 — End: 2016-07-17
  Administered 2016-07-11 – 2016-07-17 (×4): 4 mg via INTRAVENOUS
  Filled 2016-07-10 (×4): qty 2

## 2016-07-10 MED ORDER — DIPHENHYDRAMINE HCL 12.5 MG/5ML PO ELIX: 12.5000 mg | ORAL_SOLUTION | Freq: Four times a day (QID) | ORAL | Status: DC | PRN

## 2016-07-10 MED ORDER — MAGIC MOUTHWASH
15.0000 mL | Freq: Four times a day (QID) | ORAL | Status: DC | PRN
  Filled 2016-07-10: qty 15

## 2016-07-10 MED ORDER — BISACODYL 5 MG PO TBEC
20.0000 mg | DELAYED_RELEASE_TABLET | Freq: Once | ORAL | Status: AC
  Administered 2016-07-11: 20 mg via ORAL
  Filled 2016-07-10: qty 4

## 2016-07-10 MED ORDER — FLUCONAZOLE 50 MG PO TABS: 200.0000 mg | ORAL_TABLET | Freq: Every day | ORAL | Status: DC

## 2016-07-10 MED ORDER — SODIUM CHLORIDE 0.9 % IV SOLN
1.0000 g | INTRAVENOUS | Status: DC
  Administered 2016-07-10 – 2016-07-11 (×2): 1 g via INTRAVENOUS
  Filled 2016-07-10 (×3): qty 1

## 2016-07-10 MED ORDER — LORAZEPAM 2 MG/ML IJ SOLN: 0.5000 mg | Freq: Three times a day (TID) | INTRAMUSCULAR | Status: DC | PRN

## 2016-07-10 MED ORDER — ACETAMINOPHEN 500 MG PO TABS
1000.0000 mg | ORAL_TABLET | Freq: Three times a day (TID) | ORAL | Status: DC
  Administered 2016-07-13 – 2016-07-15 (×6): 1000 mg via ORAL
  Filled 2016-07-10 (×7): qty 2

## 2016-07-10 MED ORDER — DIPHENHYDRAMINE HCL 50 MG/ML IJ SOLN: 12.5000 mg | Freq: Four times a day (QID) | INTRAMUSCULAR | Status: DC | PRN

## 2016-07-10 MED ORDER — ONDANSETRON 4 MG PO TBDP: 4.0000 mg | ORAL_TABLET | Freq: Four times a day (QID) | ORAL | Status: DC | PRN

## 2016-07-10 MED ORDER — FLUCONAZOLE 200 MG PO TABS
200.0000 mg | ORAL_TABLET | Freq: Every day | ORAL | Status: DC
  Administered 2016-07-10 – 2016-07-11 (×2): 200 mg via ORAL
  Filled 2016-07-10 (×2): qty 2

## 2016-07-10 MED ORDER — POLYETHYLENE GLYCOL 3350 17 G PO PACK: 17.0000 g | PACK | Freq: Two times a day (BID) | ORAL | Status: DC

## 2016-07-10 MED ORDER — CELECOXIB 400 MG PO CAPS
400.0000 mg | ORAL_CAPSULE | ORAL | Status: AC
Start: 2016-07-12 — End: 2016-07-12
  Administered 2016-07-12: 400 mg via ORAL
  Filled 2016-07-10: qty 1

## 2016-07-10 MED ORDER — MENTHOL 3 MG MT LOZG: 1.0000 | LOZENGE | OROMUCOSAL | Status: DC | PRN

## 2016-07-10 MED ORDER — VITAMIN C 500 MG PO TABS
500.0000 mg | ORAL_TABLET | Freq: Two times a day (BID) | ORAL | Status: DC
Start: 2016-07-10 — End: 2016-07-16
  Administered 2016-07-10 – 2016-07-15 (×9): 500 mg via ORAL
  Filled 2016-07-10 (×11): qty 1

## 2016-07-10 MED ORDER — ACETAMINOPHEN 325 MG PO TABS: 1000.0000 mg | ORAL_TABLET | Freq: Three times a day (TID) | ORAL | Status: DC

## 2016-07-10 MED ORDER — LACTATED RINGERS IV BOLUS (SEPSIS): 1000.0000 mL | Freq: Three times a day (TID) | INTRAVENOUS | Status: DC | PRN

## 2016-07-10 MED ORDER — ACETAMINOPHEN 500 MG PO TABS: 1000.0000 mg | ORAL_TABLET | Freq: Three times a day (TID) | ORAL | Status: DC

## 2016-07-10 MED ORDER — LIP MEDEX EX OINT
1.0000 "application " | TOPICAL_OINTMENT | Freq: Two times a day (BID) | CUTANEOUS | Status: DC
  Administered 2016-07-10 – 2016-07-17 (×10): 1 via TOPICAL
  Filled 2016-07-10 (×3): qty 7

## 2016-07-10 MED ORDER — HYOSCYAMINE SULFATE ER 0.375 MG PO TB12
0.3750 mg | ORAL_TABLET | Freq: Two times a day (BID) | ORAL | Status: DC
  Administered 2016-07-10 – 2016-07-15 (×9): 0.375 mg via ORAL
  Filled 2016-07-10 (×12): qty 1

## 2016-07-10 MED ORDER — POLYETHYLENE GLYCOL 3350 17 GM/SCOOP PO POWD
1.0000 | Freq: Once | ORAL | Status: AC
  Administered 2016-07-11: 255 g via ORAL
  Filled 2016-07-10: qty 255

## 2016-07-10 MED ORDER — ALUM & MAG HYDROXIDE-SIMETH 200-200-20 MG/5ML PO SUSP
30.0000 mL | Freq: Four times a day (QID) | ORAL | Status: DC | PRN
  Administered 2016-07-16: 30 mL via ORAL
  Filled 2016-07-10: qty 30

## 2016-07-10 MED ORDER — SIMETHICONE 80 MG PO CHEW
40.0000 mg | CHEWABLE_TABLET | Freq: Four times a day (QID) | ORAL | Status: DC | PRN
  Administered 2016-07-14 – 2016-07-15 (×2): 40 mg via ORAL
  Filled 2016-07-10 (×3): qty 1

## 2016-07-10 MED ORDER — METHOCARBAMOL 500 MG PO TABS: 1000.0000 mg | ORAL_TABLET | Freq: Four times a day (QID) | ORAL | Status: DC | PRN

## 2016-07-10 NOTE — H&P (Signed)
Lindsay Little 07/10/2016 2:06 PM Location: [facility] Patient #: [id number] DOB: [birth year] Married / Language: [name] / Race: White  Patient Care Team: [name], MD as PCP - General (Family Medicine) Michael Boston, MD as Consulting Physician (General Surgery) Truitt Merle, MD as Consulting Physician (Medical Oncology) Kyung Rudd, MD as Consulting Physician (Radiation Oncology) Teena Irani, MD as Consulting Physician (Gastroenterology) Raynelle Bring, MD as Consulting Physician (Urology)   History of Present Illness Lindsay Hector MD; 07/10/2016 7:03 PM) The patient is a 50 year old female who presents with colorectal cancer. Note for "Colorectal cancer": Patient returns status post takedown of loop ileostomy 5 weeks ago.   Patient called noticing worsening pelvic pain in drainage from her general vagina. Became feculent. Concerning. More discomfort. CT scan done showed contrast and vagina as well as going out tube. Gluteal region concerning for colovaginal and colovesical subcutaneous fistulas. Patient desperate to stay out of the hospital and get through the holidays. I recommended close follow-up. She comes in today. Again with her husband. Natural drainage is gone down but has not resolved. She is getting worsening pain on her right inner gluteal region. Not better on the oral Cipro. Had a decent palpable yesterday but still struggles with liquidy output around it. Appetite down but no nausea or vomiting. No major fevers but occasional chills. Asking for refill on pain medication.     ADDENDUM REPORT: 07/04/2016 18:48 ADDENDUM: Discussed additional history with Dr. Johney Maine: Patient had a prior leak which was passed with omentum. Postoperative contrast enema showed no contrast extravasation. An enema tip was inserted into the rectum and a small amount of water-soluble contrast was instilled retrograde. Repeat CT imaging of the pelvis was then performed.  Rectal contrast outlines the stool in the sigmoid colon and rectum, including the large collection of stool in the presacral space. Multiplanar images show that the stool is present within a large patulous rectosigmoid colon without definite contrast extravasation. This does not appear to be a separate contiguous collection but a distended rectosigmoid segment. Also identified is presence of contrast material within the vagina compatible rectovaginal fistula, question arising at the RIGHT superolateral aspect of the vaginal cuff. Additionally, a linear collection of contrast material is seen extending posteriorly from the rectum RIGHT of the sacrum, extending into the medial aspect of the RIGHT gluteus maximus, consistent with an additional fistulous track. Infiltrative changes extend dorsally from the site on axial images into the subcutaneous fat. Electronically Signed By: Lavonia Dana M.D. On: 07/04/2016 18:48 Addended by Lavonia Dana, MD on 07/04/2016 6:50 PM  Study Result CLINICAL DATA: Colon cancer post resection, chemotherapy and radiation therapy, possible fistula EXAM: CT ABDOMEN AND PELVIS WITH CONTRAST TECHNIQUE: Multidetector CT imaging of the abdomen and pelvis was performed using the standard protocol following bolus administration of intravenous contrast. CONTRAST: 77m ISOVUE-300 IOPAMIDOL (ISOVUE-300) INJECTION 61% PO, 1033mISOVUE-300 IOPAMIDOL (ISOVUE-300) INJECTION 61% IV COMPARISON: 02/21/2016 FINDINGS: Lower chest: Lung bases clear Hepatobiliary: Poorly defined area of low-attenuation RIGHT lobe of liver centrally image 17 measuring 2.1 x 1.8 cm. Additional area of poorly defined low-attenuation lateral aspect RIGHT lobe of liver more inferiorly, 2.3 x 1.6 cm image 29. These lesions have not significantly changed in size since the previous exam. Pancreas: Normal appearance Spleen: Normal appearance Adrenals/Urinary Tract: Adrenal glands normal appearance.  Kidneys and ureters normal appearance. RIGHT hydronephrosis and hydroureter seen on previous exam resolved. Bladder wall appears mildly thickened but bladder is underdistended question artifact. Stomach/Bowel: Stomach normal appearance. Small  bowel loops nondistended without wall thickening. Increased stool throughout colon. Interval takedown of ostomy in RIGHT upper quadrant question loop ileostomy. Staple line at enterotomy closure in the RIGHT mid abdomen. Large subcutaneous fluid collection at site of ostomy take-down, 9.3 x 4.3 x 5.3 cm, may represent a sterile or infected collection. Mild overlying skin thickening and minimal surrounding infiltrative changes. Rectum distended by stool with mild thickening of the wall with adjacent infiltration of presacral and perirectal fat, question related to radiation therapy and prior surgery. A large collection of stool and gas is seen within the presacral space. This appears to be predominantly cranial to the anastomotic suture line more inferiorly. This roughly occupies the same location as the gas and fluid collection identified on the prior CT. While this could represent stool within a distended rectum, the fact that this courses cranially in the same distribution as the previous abscess collection suggests this may be stool extraluminal to the rectosigmoid colon within a contiguous contained collection. This overall measures approximately 8 cm transverse by 7 cm AP and extends 9 cm length. Vascular/Lymphatic: Multiple normal sized para-aortic and LEFT common iliac nodes. No additional abdominal or pelvic adenopathy. Aorta normal caliber. 7 mm LEFT external iliac node image 86 unchanged. Multiple small nodules in the fat adjacent to the rectosigmoid colon axial images 73, 74, 78 question tiny lymph nodes, largest 8 mm short axis coronal image 62, minimally increased. Reproductive: Post hysterectomy. RIGHT ovary not visualized. Suspect visualized LEFT ovary.  Air within vagina. Other: No free air or free fluid. No hernia. Musculoskeletal: Osseous structures unremarkable. IMPRESSION: Subcutaneous fluid collection at the site of ileostomy takedown, could be sterile or infected, measuring 9.3 x 4.3 x 5.3 cm; this could be aspirated under ultrasound guidance if clinically indicated. Large collection of stool within the presacral space, could potentially be within a distended distorted rectum but since this occupies the same position as the abscess collection identified on the previous CT this is suspicious for a contiguous contained extraluminal stool collection communicating with the rectosigmoid colon, collection overall roughly 7 x 8 x 9 cm. Electronically Signed: By: Lavonia Dana M.D. On: 07/04/2016 16:58  Result History  CT ABDOMEN PELVIS W CONTRAST (Order #[id number]) on 07/04/2016 - Order Result History Report - Result Edited <[email or web address]> Result Notes  Notes Recorded by Michael Boston, MD on 07/05/2016 at 7:44 AM EST Called and discussed with patient. She is eating okay. Intermittent diarrhea controlled with Imodium. I suspect its more overflow diarrhea around constantly chronically constipated rectum. While she is severely disappointed that the anastomosis is again broken down and she needs a colostomy, she would really like to wait until after the holiday season before planning surgery, especially since she struggled recovering last year. She is having some drainage of the vagina but it is not severe and it is manageable. She has some soreness but she does not have severely debilitating pelvic pain. She is not need narcotics around the clock and when she has works. She is ruling well on it though. She will keep her appointment with me next week to touch base. I will set up a time in the next couple weeks to do a laparoscopic assisted takedown anastomosis and colostomy. She needs more pain medications. I will ask my partner  to fill an Rx. #40 ------ Notes Recorded by Michael Boston, MD on 07/05/2016 at 6:52 AM EST Patient completed ordered test/studies. Results noted below. Patient with leak of contrast to vagina suspecting for delayed colovaginal fistula. Most  likely at the apex. I think her rectum has failed her again. I think she requires resection and takedown of her distal colorectal anastomosis with permanent colostomy. Despite the drains, surgeries, fecal diversion, omental pedicle flap and a normal colonoscopy and normal enema, the area has broken down again. I think that her neorectum refuses to adequately heal despite the numerous surgeries and interventions. I will call & discuss with the patient Lindsay Little, M.D., F.A.C.S. Gastrointestinal and Minimally Invasive Surgery [facility] [address], [place], [place] [address] [phone] Main / Paging    Patient ID: SHASHANA FULLINGTON MRN: [id number] DOB/AGE: [birth year] 49 y.o.  Admit date: 05/31/2016 Discharge date: 06/03/2016  Patient Care Team: [name], MD as PCP - General (Family Medicine) Michael Boston, MD as Consulting Physician (General Surgery) Truitt Merle, MD as Consulting Physician ([place]) Kyung Rudd, MD as Consulting Physician (Radiation Oncology) Teena Irani, MD as Consulting Physician (Gastroenterology) Raynelle Bring, MD as Consulting Physician (Urology)  Admission Diagnoses: Principal Problem: Colorectal delayed anastomotic leak - healed, s/p ileostomy takedown 05/31/2016 Active Problems: Rectal adenocarcinoma s/p LAR resection 05/25/2015 Morbid obesity with BMI of 40.0-44.9, adult ([place]) Crohn's disease ([place]) Hypertension IBS (irritable bowel syndrome) Depression Pelvic abscess s/p drainage & omental pedicle flap 11/17/2015   Discharge Diagnoses: Principal Problem: Colorectal delayed anastomotic leak - healed, s/p ileostomy takedown 05/31/2016 Active  Problems: Rectal adenocarcinoma s/p LAR resection 05/25/2015 Morbid obesity with BMI of 40.0-44.9, adult ([place]) Crohn's disease ([place]) Hypertension IBS (irritable bowel syndrome) Depression Pelvic abscess s/p drainage & omental pedicle flap 11/17/2015   POST-OPERATIVE DIAGNOSIS:  loop ileostomy in place for fecal diversion  SURGERY: 05/31/2016  Procedure(s): TAKEDOWN LOOP ILEOSTOMY  SURGEON:   Surgeon(s): Michael Boston, MD  INDICATION: Pleasant patient status post low anterior resection for bulky rectal cancer. Developed collection that turned and abscess with delayed anastomotic leak and fistula. Eventually required fecal diversion. Had reoperation with omental pedicle flap. Biopsies disprove cancer recurrence. Anastomosis closed and healed. Colonoscopy two days ago showing omentum granulating at colorectal anastomosis otherwise no evidence of cancer recurrence or other cancers. The patient has recovered from that surgery with the anastomosis well-healed. It was felt safe to have the loop ileostomy taken down. I discussed the procedure with the patient:  The anatomy & physiology of the digestive tract was discussed. The pathophysiology was discussed. Possibility of remaining with an ostomy permanently was discussed. I offered ostomy takedown. Laparoscopic & open techniques were discussed.  Risks such as bleeding, infection, abscess, leak, reoperation, possible re-ostomy, injury to other organs, hernia, heart attack, death, and other risks were discussed. I noted a good likelihood this will help address the problem. Goals of post-operative recovery were discussed as well. We will work to minimize complications. Questions were answered. The patient expresses understanding & wishes to proceed with surgery.  OR FINDINGS: Thick abdominal wall with dense adhesions to the diverting loop ileostomy but no parastomal hernia. Normal anatomy.   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 Oral Surgery Shoulder Surgery Left.  Diagnostic Studies History Lindsay Hector, MD; 07/10/2016 2:36 PM) Colonoscopy >10 years ago Mammogram 1-3 years ago Pap Smear 1-5 years ago  Allergies Lindsay Little, [place]; 07/10/2016 2:06 PM) Allergies Reconciled  Medication History Lindsay Little, [place]; 07/10/2016 2:07 PM) Dilaudid (4MG Tablet, 1 (one) Oral every six hours, as needed, Taken starting 11/10/2015) Active. MetroNIDAZOLE (500MG Tablet, 1 (one) Tablet Oral bid, Taken starting 10/31/2015) Active. HYDROmorphone HCl (4MG Tablet, 1/2 -2 Tablet Oral every four hours, as needed, Taken starting 10/31/2015) Active. Ciprofloxacin HCl (500MG Tablet, 1 (one) Tablet Oral twice a day, Taken starting 10/31/2015) Active. Potassium Chloride ER (20MEQ Tablet ER, 2 (two) Tablet Oral two times daily, Taken starting 12/15/2015) Active. Hydrocodone-Acetaminophen (5-325MG Tablet, 1 to 2 Tablet Oral every six hours, as needed, Taken starting 07/01/2016) Active. Norco (7.5-325MG Tablet, 1-2 Tablet Oral every six hours, as needed, Taken starting 07/05/2016) Active. Cipro (500MG Tablet, 1 (one) Tablet Oral two times daily, Taken starting 07/05/2016) Active. Flagyl (500MG Tablet, 1 (one) Tablet Oral two times daily, Taken starting 07/05/2016) Active. Remeron (15MG Tablet, Oral at bedtime)  Active. Clotrimazole (10MG Lozenge, Mouth/Throat) Active. DULoxetine HCl (60MG Capsule DR Part, Oral) Active. Esomeprazole Magnesium (40MG Capsule DR, Oral) Active. Furosemide (20MG Tablet, Oral) Active. Hydrochlorothiazide (25MG Tablet, Oral) Active. Hyoscyamine Sulfate ER (0.375MG Tablet ER 12HR, Oral) Active. Meloxicam (15MG Tablet, Oral) Active. Crestor (5MG Tablet, Oral) Active. Bystolic (81EH Tablet, Oral) Active. Coenzyme Q10 (100MG Capsule, Oral) Active. Donnatal (16.2MG Tablet, Oral) Active. Robaxin (500MG Tablet, Oral) Active. Vitamin D (1000UNIT Tablet, Oral) Active. Medications Reconciled Diphenoxylate-Atropine (2.5-0.025MG Tablet, Oral daily) Active. Ondansetron HCl (8MG Tablet, Oral daily) Active. Vitamin D (Ergocalciferol) (50000UNIT Capsule, Oral daily) Active.  Pregnancy / Birth History Lindsay Hector, MD; 07/10/2016 2:36 PM) Age at menarche 76 years. Age of menopause 79-50 Gravida 4 Irregular periods Maternal age 13-20 Para 0  Other Problems Lindsay Hector, MD; 07/10/2016 2:36 PM) Back Pain Diverticulosis Gastroesophageal Reflux Disease General anesthesia - complications High blood pressure Hypercholesterolemia Other disease, cancer, significant illness Rectal Cancer Transfusion history    Vitals Lindsay Little CMA; 07/10/2016 2:08 PM) 07/10/2016 2:07 PM Weight: 294.2 lb Height: 70.5in Body Surface Area: 2.47 m Body Mass Index: 41.62 kg/m  Temp.: 98.67F  Pulse: 95 (Regular)  BP: 142/88 (Sitting, Left Arm, Standard)   BP (!) 144/93 (BP Location: Right Arm)   Pulse 90   Temp 97.8 F (36.6 C) (Oral)   Resp 16   Ht 5' 10"  (1.778 m)   Wt 133.1 kg (293 lb 6.9 oz)   SpO2 96%   BMI 42.10 kg/m     Physical Exam Lindsay Hector MD; 07/10/2016 7:05 PM)  General Mental Status-Alert. General Appearance-Not in acute distress. Voice-Normal. Note: Tired. Nontoxic.  Integumentary Global  Assessment Normal Exam - Distribution of scalp and body hair is normal. General Characteristics Overall Skin Surface - no rashes and no suspicious lesions.  Head and Neck Head-normocephalic, atraumatic with no lesions or palpable masses. Face Global Assessment - atraumatic, no absence of expression. Neck Global Assessment - no abnormal movements, no decreased range of motion. Trachea-midline. Thyroid Gland Characteristics - non-tender.  Eye Eyeball - Left-Extraocular movements intact, No Nystagmus. Eyeball - Right-Extraocular movements intact, No Nystagmus. Upper Eyelid - Left-No Cyanotic. Upper Eyelid - Right-No Cyanotic. Note: Wears glasses. Vision acceptable  Chest and Lung Exam Inspection Accessory muscles - No use of accessory muscles in breathing. Note: R infraclavilcuar port clean without cellulitis  Abdomen Note: Incisions with normal healing ridges. No guarding/rebound tenderness. Really no  pain. No hernias. No drainage.  Female Genitourinary Note: feculent drainage from introitus  Rectal Note: thin liquid stool in the perineum with some pruritis. Mildly increased sphincter tone. Held off on digital exam.  Right upper inner buttock with 5 x 5 cm area of redness and tenderness with some blistering. Exquisite to light touch. Very suspicious for abscess  Peripheral Vascular Upper Extremity Inspection - Left - Not Gangrenous, No Petechiae. Right - Not Gangrenous, No Petechiae.  Neurologic Neurologic evaluation reveals -normal attention span and ability to concentrate, able to name objects and repeat phrases. Appropriate fund of knowledge and normal coordination.  Neuropsychiatric Mental status exam performed with findings of-able to articulate well with normal speech/language, rate, volume and coherence and no evidence of hallucinations, delusions, obsessions or homicidal/suicidal ideation. Orientation-oriented X3. Note: Tired.  Anxious. Consulable. Tearful at the end.  Musculoskeletal Global Assessment Gait and Station - normal gait and station.  Lymphatic General Lymphatics Description - No Generalized lymphadenopathy.   Results Lindsay Hector MD; 07/10/2016 7:10 PM) Procedures  Name Value Date I&D Abscess/Cyst Informed Consent: Yes Indication: Pain Infection Anatomical Location: The anatomy & physiology of the anorectal region was discussed. The pathophysiology of anorectal abscess and differential diagnosis was discussed. Natural history progression such as fistula, gangrene, etc was discussed. I stressed the importance of a bowel regimen to have daily soft bowel movements to minimize progression of disease.  The patient's symptoms are not adequately controlled. Non-operative treatment has not healed the abscess. Therefore, I recommended incision & drainage of the abscess to allow the infection to resolve and heal. Technique, risks, benefits, alternatives discussed. The patient expressed understanding & wished to proceed. The patient was positioned lateral decubitus. I placed a field block with local anaesthetic over the right upper inner gluteal cellulitis and blistering.. I incised the skin over the abscess to release the infection. I excised skin at the wound to have an adequate opening for drainage & prevent skin reclosure. Very sensitive painful area but eventually got a control with large field block. Removed some flecks of stool and thinly purulent fluid. Tracked 10 cm to pelvic floor. I packed the wound with ribbon NU-Gauze. The patient tolerated the procedure. Educational handouts further explaining the pathology, treatment options, and bowel regimen were given. We will have the patient return to clinic for close follow up to make sure the infection heals Anesthetic: 0.25% Marcaine with 1:200,000 epinephrine Procedure performed: I & D Packing: Yes Site Appearance  Post Procedure: Minimal bleeding observed Patient response to procedure: No immediate complications observed Recommendations/Patient Instructions: Other (ADMISSION)  Performed: 07/10/2016 7:07 PM    Assessment & Plan Lindsay Hector MD; 07/10/2016 7:09 PM)  LARGE BOWEL ANASTOMOTIC LEAK (K91.89) Impression: Worsening pelvic pain and evidence of leakage of contrast through pelvic floor, right gluteal region and out vagina consistent with breakdown and chronic leakage.  As I discussed with her last week, I think she needs anastomotic takedown and permanent colostomy. She is developing a perirectal abscess that is rather complex. I drained it today.  Standard care would be admission with antibiotics and colostomy creation. She and her husband are ready now. Pain has become much worse.  Will admit with IV antibiotics. Invanz fluconazole given penicillin allergy.  Try and gently cleaned around the bowel prep.  Plan laparoscopically assisted takedown anastomosis and colostomy creation. Try and see if I can soft the rectal stump to avoid a chronic draining sinus. Most likely he will be on Friday as there is limited availability  in the operating room.  The anatomy & physiology of the digestive tract was discussed.  The pathophysiology of colorectal fistula to the vagina was discussed.  Natural history risks without surgery was discussed. I worked to give an overview of the disease and the frequent need to have multispecialty involvement.   I feel the risks of no intervention will lead to serious problems that outweigh the operative risks; therefore, I recommended surgery to treat the pathology.  Minimally Invasive (robotic/laparoscopic) & open techniques for probable colectomy were discussed.  Repair of the vaginal opening with possible need for intervening material to prevent recurrence such as omentum, muscle, mesh, etc was discussed.  Possible fecal diversion by ostomy was discussed.  We will  work to preserve anal & pelvic floor function without sacrificing cure.  Risks such as bleeding, infection, abscess, leak, injury to other organs, need for repair of tissues / organs, recurrence with reoperation, possible ostomy, hernia, heart attack, death, and other risks were discussed.  I noted a good likelihood this will help address the problem.   Goals of post-operative recovery were discussed as well.  We will work to minimize complications.  Educational information was given as well.  Questions were answered.    The patient expresses understanding & wishes to proceed with surgery.   Current Plans Pt Education - [facility] Ostomy HCI (Brynn Reznik): discussed with patient and provided information. Pt Education - [facility] Pain Control (Roshanda Balazs) RECTAL ADENOCARCINOMA (C20) Impression: Complicated recovery due to late abscess and contained anastomotic leak with some help of percutaneous drainage and antibiotics.  No evidence of cancer recurrence.  She would benefit from post-adjuvant chemotherapy but obviously this must be delayed until the fistula is closed. Sounds like they're observing only at this point given the prolonged period between surgery and healing.  Continue follow-up appointment with her medical oncologist. That should be Dr. Burr Medico.  I think she would benefit from a colonoscopy to make sure she has no cancer recurrence nor any new lesions. It's been a year. Last Dr. Amedeo Plenty to do. She is diverted with her ileostomy so no bowel prep needed. We will have that done after the enema is done. There was no evidence of any retained stool on the last enema done months ago  S/P COLON RESECTION - 1st postop visit (Z90.49)  Current Plans Pt Education - [facility] Colectomy post-op instructions: discussed with patient and provided information. Consider follow up colonoscopy by your gastroenterologist, depending on your diagnosis. Call your gastroenterologist for advice  If you had a colon or rectal cancer  resected by surgery, you should strongly consider getting a colonoscopy by your gastroenterologict one year after the colon cancer was removed. If it was a benign polyp that was removed by colon resection, consider follow-up colonoscopy in about 3 years.  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   PERIRECTAL FISTULA (K60.4)  Current Plans INCISION AND DRAINAGE OF PERIRECTAL ABSCESS [phone]) The anatomy & physiology of the anorectal region was discussed. We discussed the pathophysiology of anorectal abscess and fistula. Differential diagnosis was discussed. Natural history progression was discussed. I stressed the importance of a bowel regimen to have daily soft bowel movements to minimize progression of disease.  The patient's condition is not adequately controlled. Non-operative treatment has not healed the fistula. Therefore, I recommended examination under anaesthesia to confirm the diagnosis and treat the fistula. I discussed techniques that  may be required such as fistulotomy, ligation by LIFT technique, and/or seton placement. Benefits & alternatives discussed. I noted a good likelihood this will help address the problem, but sometimes repeat operations and prolonged healing times may occur. Risks such as bleeding, pain, recurrence, reoperation, incontinence, heart attack, death, and other risks were discussed.  Educational handouts further explaining the pathology, treatment options, and bowel regimen were given. The patient expressed understanding & wishes to proceed. We will work to coordinate surgery for a mutually convenient time.  The anatomy & physiology of the anorectal region was discussed. The pathophysiology of anorectal abscess and differential diagnosis was discussed. Natural history progression such  as fistula, gangrene, etc was discussed. I stressed the importance of a bowel regimen to have daily soft bowel movements to minimize progression of disease.  The patient's symptoms are not adequately controlled. Non-operative treatment has not healed the abscess. Therefore, I recommended incision & drainage of the abscess to allow the infection to resolve and heal. Technique, risks, benefits, alternatives discussed. The patient expressed understanding & wished to proceed.  The patient was positioned lateral decubitus. I placed a field block with local anaesthetic over the right upper inner gluteal cellulitis and blistering.. I incised the skin over the abscess to release the infection. I excised skin at the wound to have an adequate opening for drainage & prevent skin reclosure. Very sensitive painful area but eventually got a control with large field block. Removed some flecks of stool and thinly purulent fluid. Tracked 10 cm to pelvic floor. I packed the wound with ribbon NU-Gauze. The patient tolerated the procedure.  Educational handouts further explaining the pathology, treatment options, and bowel regimen were given. We will have the patient return to clinic for close follow up to make sure the infection heals  Lindsay Little, M.D., F.A.C.S. Gastrointestinal and Minimally Invasive Surgery [facility] [address], [place], [place] [address] [phone] Main / Paging

## 2016-07-11 ENCOUNTER — Other Ambulatory Visit: Payer: Self-pay | Admitting: Hematology

## 2016-07-11 ENCOUNTER — Encounter (HOSPITAL_COMMUNITY): Payer: Self-pay | Admitting: General Practice

## 2016-07-11 MED ORDER — ENSURE ENLIVE PO LIQD
237.0000 mL | Freq: Two times a day (BID) | ORAL | Status: DC
  Administered 2016-07-17: 237 mL via ORAL

## 2016-07-11 MED ORDER — IRON DEXTRAN 50 MG/ML IJ SOLN
250.0000 mg | Freq: Once | INTRAMUSCULAR | Status: AC
  Administered 2016-07-11: 250 mg via INTRAVENOUS
  Filled 2016-07-11: qty 5

## 2016-07-11 MED ORDER — SODIUM CHLORIDE 0.9 % IV SOLN
25.0000 mg | Freq: Once | INTRAVENOUS | Status: AC
  Administered 2016-07-11: 25 mg via INTRAVENOUS
  Filled 2016-07-11: qty 0.5

## 2016-07-11 NOTE — Consult Note (Addendum)
[facility] Nurse ostomy consult note [place] Nurse requested for preoperative stoma site marking by Dr. Johney Maine.  Operative procedure planned for tomorrow afternoon, 07/12/16. Patient well known to our service for her past ileostomy, RUQ with subsequent takedown 5 weeks ago.  Discussed surgical procedure and stoma creation with patient and husband.   Examined patient sitting, and standing in order to place the marking in the patient's visual field, away from any creases or abdominal contour issues and within the rectus muscle.  Two marks are provided in the LUQ, one considerably higher is preferred over the the other.  Sites are marked #1 and #2.   Site #1:  Marked for colostomy in the LUQ  11cm to the left of the umbilicus and  92.4 cm above the umbilicus. Site #2:  Marked for colostomy in the LUQ  10 cm to the left of the umbilicus and 4.0 cm above the umbilicus.  Patient's abdomen cleansed with CHG wipes at site markings, allowed to air dry prior to marking. Covered mark with thin film transparent dressing to preserve mark until date of surgery.   [place] Nurse team will follow up with patient after surgery for ostomy care and teaching.   Thanks, Maudie Flakes, MSN, RN, [place], Arther Abbott  Pager# [phone]

## 2016-07-11 NOTE — Progress Notes (Signed)
[facility]  [address] [place], [place] [address] Phone: [phone] FAX: [phone]   Lindsay Little [id number] Mar 11, 1966    Problem List:   Principal Problem:   Colovaginal fistula Active Problems:   Rectal adenocarcinoma s/p LAR resection 05/25/2015   Morbid obesity with BMI of 40.0-44.9, adult [facility])   Colorectal delayed anastomotic leak - healed, s/p ileostomy takedown 05/31/2016   Rectocutaneous fistula           Assessment  Fair  Plan:  -IV ABx -gentle bowel prep -anxiolysis -control fibromyalgia -VTE prophylaxis- SCDs, etc -mobilize as tolerated to help recovery  Plan takedown of anastomsis & repair of colovaginal fistula tomorrow:  The anatomy & physiology of the digestive tract was discussed.  The pathophysiology of colorectal fistula to the vagina was discussed.  Natural history risks without surgery was discussed. I worked to give an overview of the disease and the frequent need to have multispecialty involvement.   I feel the risks of no intervention will lead to serious problems that outweigh the operative risks; therefore, I recommended surgery to treat the pathology.  Minimally Invasive (robotic/laparoscopic) & open techniques for probable colectomy were discussed.  Repair of the vaginal opening with possible need for intervening material to prevent recurrence such as omentum, muscle, mesh, etc was discussed.  Possible fecal diversion by ostomy was discussed.  We will work to preserve anal & pelvic floor function without sacrificing cure.  Risks such as bleeding, infection, abscess, leak, injury to other organs, need for repair of tissues / organs, recurrence with reoperation, possible ostomy, hernia, heart attack, death, and other risks were discussed.  I noted a good likelihood this will help address the problem.   Goals of post-operative recovery were discussed as well.  We will work to minimize  complications.  Educational information was given as well.  Questions were answered.    The patient expresses understanding & wishes to proceed with surgery.   [name], M.D., F.A.C.S. Gastrointestinal and Minimally Invasive Surgery [facility] [address] [place], [place] [address] [phone] Main / Paging   07/11/2016  CARE TEAM:  PCP: Vidal Schwalbe, MD  Outpatient Care Team: Patient Care Team: [name], MD as PCP - General (Family Medicine) Michael Boston, MD as Consulting Physician (General Surgery) Truitt Merle, MD as Consulting Physician ([place]) Kyung Rudd, MD as Consulting Physician ([place]) Teena Irani, MD as Consulting Physician (Gastroenterology) Raynelle Bring, MD as Consulting Physician (Urology)  Inpatient Treatment Team: Treatment Team: Attending Provider: Michael Boston, MD; Registered Nurse: Terrial Rhodes, RN  Subjective:  Lindsay Little better controlled Sore in perineum  Objective:  Vital signs:  Vitals:   07/10/16 1835 07/10/16 2200 07/11/16 0621  BP: (!) 144/93 101/60 (!) 115/56  Pulse: 90 70 60  Resp: _0 Temp: 97.8 F (36.6 C) 97.8 F (36.6 C) 97.8 F (36.6 C)  TempSrc: Oral Oral Oral  SpO2: 96% 99% 98%  Weight: 133.1 kg (293 lb 6.9 oz)    Height: _1  (1.778 m)      Last BM Date: 07/11/16  Intake/Output   Yesterday:  12/27 0701 - 12/28 0700 In: 1623.3 [I.V.:573.3; IV Piggyback:1050] Out: -  This shift:  No intake/output data recorded.  Bowel function:  Flatus: YES  BM:  YES  Drain: Feculent mild out right gluteal wick   Physical Exam:  General: Pt awake/alert/oriented x4 in mild acute distress Eyes: PERRL, normal  EOM.  Sclera clear.  No icterus Neuro: CN II-XII intact w/o focal sensory/motor deficits. Lymph: No head/neck/groin lymphadenopathy Psych:  No delerium/psychosis/paranoia.  Tearful but consolable HENT: Normocephalic, Mucus membranes moist.   No thrush Neck: Supple, No tracheal deviation Chest: No chest wall pain w good excursion CV:  Pulses intact.  Regular rhythm MS: Normal AROM mjr joints.  No obvious deformity Abdomen: Soft.  Nondistended.  Nontender.  No evidence of peritonitis.  No incarcerated hernias. GU - feculent drainage Rectal: Some liquid diarrhea.  Wick in place with less erythema Ext:  SCDs BLE.  No mjr edema.  No cyanosis Skin: No petechiae / purpura  Results:   Labs: Results for orders placed or performed during the hospital encounter of 07/10/16 (from the past 48 hour(s))  Comprehensive metabolic panel     Status: Abnormal   Collection Time: 07/10/16  8:00 PM  Result Value Ref Range   Sodium 140 135 - 145 mmol/L   Potassium 3.8 3.5 - 5.1 mmol/L   Chloride 105 101 - 111 mmol/L   CO2 25 22 - 32 mmol/L   Glucose, Bld 145 (H) 65 - 99 mg/dL   BUN 13 6 - 20 mg/dL   Creatinine, Ser 0.65 0.44 - 1.00 mg/dL   Calcium 9.0 8.9 - 10.3 mg/dL   Total Protein 6.5 6.5 - 8.1 g/dL   Albumin 3.0 (L) 3.5 - 5.0 g/dL   AST 18 15 - 41 U/L   ALT 12 (L) 14 - 54 U/L   Alkaline Phosphatase 66 38 - 126 U/L   Total Bilirubin 0.3 0.3 - 1.2 mg/dL   GFR calc non Af Amer >60 >60 mL/min   GFR calc Af Amer >60 >60 mL/min    Comment: (NOTE) The eGFR has been calculated using the CKD EPI equation. This calculation has not been validated in all clinical situations. eGFR's persistently <60 mL/min signify possible Chronic Kidney Disease.    Anion gap 10 5 - 15  CBC WITH DIFFERENTIAL     Status: Abnormal   Collection Time: 07/10/16  8:00 PM  Result Value Ref Range   WBC 7.9 4.0 - 10.5 K/uL   RBC 3.55 (L) 3.87 - 5.11 MIL/uL   Hemoglobin 9.4 (L) 12.0 - 15.0 g/dL   HCT 29.9 (L) 36.0 - 46.0 %   MCV 84.2 78.0 - 100.0 fL   MCH 26.5 26.0 - 34.0 pg   MCHC 31.4 30.0 - 36.0 g/dL   RDW 13.8 11.5 - 15.5 %   Platelets 399 150 - 400 K/uL   Neutrophils Relative % 77 %   Neutro Abs 6.1 1.7 - 7.7 K/uL   Lymphocytes Relative 14 %   Lymphs  Abs 1.1 0.7 - 4.0 K/uL   Monocytes Relative 5 %   Monocytes Absolute 0.4 0.1 - 1.0 K/uL   Eosinophils Relative 4 %   Eosinophils Absolute 0.3 0.0 - 0.7 K/uL   Basophils Relative 0 %   Basophils Absolute 0.0 0.0 - 0.1 K/uL    Imaging / Studies: No results found.  Medications / Allergies: per chart  Antibiotics: Anti-infectives    Start     Dose/Rate Route Frequency Ordered Stop   07/12/16 1000  clindamycin (CLEOCIN) IVPB 900 mg     900 mg 100 mL/hr over 30 Minutes Intravenous 60 min pre-op 07/10/16 1841     07/12/16 1000  gentamicin (GARAMYCIN) 470 mg in dextrose 5 % 100 mL IVPB     5 mg/kg  94.3 kg (Adjusted) 111.8  mL/hr over 60 Minutes Intravenous 60 min pre-op 07/10/16 1841     07/12/16 1000  clindamycin (CLEOCIN) 900 mg, gentamicin (GARAMYCIN) 240 mg in sodium chloride 0.9 % 1,000 mL for intraperitoneal lavage      Intraperitoneal To Surgery 07/10/16 1841 07/13/16 1000   07/11/16 1400  neomycin (MYCIFRADIN) tablet 1,000 mg     1,000 mg Oral 3 times per day 07/10/16 1844 07/12/16 1359   07/10/16 2015  ertapenem (INVANZ) 1 g in sodium chloride 0.9 % 50 mL IVPB     1 g 100 mL/hr over 30 Minutes Intravenous Every 24 hours 07/10/16 1848     07/10/16 1900  fluconazole (DIFLUCAN) tablet 200 mg     200 mg Oral Daily 07/10/16 1848     07/10/16 1841  neomycin (MYCIFRADIN) tablet 1,000 mg  Status:  Discontinued     1,000 mg Oral 3 times per day 07/10/16 1841 07/10/16 1844   07/10/16 1841  metroNIDAZOLE (FLAGYL) tablet 1,000 mg  Status:  Discontinued     1,000 mg Oral 3 times per day 07/10/16 1841 07/10/16 1844        Note: Portions of this report may have been transcribed using voice recognition software. Every effort was made to ensure accuracy; however, inadvertent computerized transcription errors may be present.   Any transcriptional errors that result from this process are unintentional.     [name], M.D., F.A.C.S. Gastrointestinal and Minimally Invasive  Surgery [facility] [address], [place], [place] [address] [phone] Main / Paging   07/11/2016

## 2016-07-12 ENCOUNTER — Inpatient Hospital Stay: Admit: 2016-07-12 | Payer: No Typology Code available for payment source | Admitting: Surgery

## 2016-07-12 ENCOUNTER — Inpatient Hospital Stay (HOSPITAL_COMMUNITY): Payer: No Typology Code available for payment source | Admitting: Certified Registered Nurse Anesthetist

## 2016-07-12 ENCOUNTER — Encounter (HOSPITAL_COMMUNITY)
Admission: AD | Disposition: A | Discharge status: Home / Self Care | Payer: Self-pay | Source: Ambulatory Visit | Attending: Surgery

## 2016-07-12 ENCOUNTER — Ambulatory Visit: Payer: No Typology Code available for payment source

## 2016-07-12 DIAGNOSIS — Z933 Colostomy status: Secondary | ICD-10-CM

## 2016-07-12 HISTORY — PX: COLON RESECTION: SHX5231

## 2016-07-12 HISTORY — DX: Colostomy status: Z93.3

## 2016-07-12 LAB — POCT I-STAT EG7
BICARBONATE: 27 mmol/L (ref 20.0–28.0)
Calcium, Ion: 1.21 mmol/L (ref 1.15–1.40)
HCT: 32 % — ABNORMAL LOW (ref 36.0–46.0)
Hemoglobin: 10.9 g/dL — ABNORMAL LOW (ref 12.0–15.0)
O2 Saturation: 70 %
PH VEN: 7.329 (ref 7.250–7.430)
PO2 VEN: 40 mmHg (ref 32.0–45.0)
POTASSIUM: 4.1 mmol/L (ref 3.5–5.1)
SODIUM: 140 mmol/L (ref 135–145)
TCO2: 29 mmol/L (ref 0–100)
pCO2, Ven: 51.3 mmHg (ref 44.0–60.0)

## 2016-07-12 LAB — TYPE AND SCREEN
ABO/RH(D): O POS
ANTIBODY SCREEN: NEGATIVE

## 2016-07-12 SURGERY — LAPAROSCOPIC SIGMOID COLON RESECTION
Anesthesia: General

## 2016-07-12 MED ORDER — MELOXICAM 15 MG PO TABS
15.0000 mg | ORAL_TABLET | Freq: Every day | ORAL | Status: DC
  Administered 2016-07-13 – 2016-07-17 (×5): 15 mg via ORAL
  Filled 2016-07-12 (×5): qty 1

## 2016-07-12 MED ORDER — HYDROMORPHONE HCL 1 MG/ML IJ SOLN
INTRAMUSCULAR | Status: AC
  Filled 2016-07-12: qty 0.5

## 2016-07-12 MED ORDER — MIDAZOLAM HCL 2 MG/2ML IJ SOLN
INTRAMUSCULAR | Status: AC
  Filled 2016-07-12: qty 2

## 2016-07-12 MED ORDER — BUPIVACAINE HCL 0.25 % IJ SOLN
INTRAMUSCULAR | Status: AC
  Filled 2016-07-12: qty 2

## 2016-07-12 MED ORDER — FENTANYL CITRATE (PF) 100 MCG/2ML IJ SOLN
INTRAMUSCULAR | Status: DC | PRN
  Administered 2016-07-12 (×3): 50 ug via INTRAVENOUS
  Administered 2016-07-12: 100 ug via INTRAVENOUS

## 2016-07-12 MED ORDER — SCOPOLAMINE 1 MG/3DAYS TD PT72
MEDICATED_PATCH | TRANSDERMAL | Status: AC
  Filled 2016-07-12: qty 1

## 2016-07-12 MED ORDER — MEPERIDINE HCL 50 MG/ML IJ SOLN: 6.2500 mg | INTRAMUSCULAR | Status: DC | PRN

## 2016-07-12 MED ORDER — SODIUM CHLORIDE 0.9 % IV SOLN: Freq: Once | INTRAVENOUS | Status: DC

## 2016-07-12 MED ORDER — ROCURONIUM BROMIDE 50 MG/5ML IV SOSY
PREFILLED_SYRINGE | INTRAVENOUS | Status: AC
  Filled 2016-07-12: qty 5

## 2016-07-12 MED ORDER — METOCLOPRAMIDE HCL 5 MG PO TABS: 5.0000 mg | ORAL_TABLET | Freq: Four times a day (QID) | ORAL | Status: DC | PRN

## 2016-07-12 MED ORDER — DEXAMETHASONE SODIUM PHOSPHATE 10 MG/ML IJ SOLN
INTRAMUSCULAR | Status: AC
  Filled 2016-07-12: qty 1

## 2016-07-12 MED ORDER — BUPIVACAINE LIPOSOME 1.3 % IJ SUSP
20.0000 mL | Freq: Once | INTRAMUSCULAR | Status: AC
  Administered 2016-07-12: 70 mL
  Filled 2016-07-12: qty 20

## 2016-07-12 MED ORDER — SODIUM CHLORIDE 0.9 % IV SOLN
25.0000 mg | Freq: Four times a day (QID) | INTRAVENOUS | Status: DC | PRN
  Filled 2016-07-12: qty 1

## 2016-07-12 MED ORDER — CLINDAMYCIN PHOSPHATE 900 MG/50ML IV SOLN
INTRAVENOUS | Status: AC
  Filled 2016-07-12: qty 50

## 2016-07-12 MED ORDER — ROCURONIUM BROMIDE 50 MG/5ML IV SOSY
PREFILLED_SYRINGE | INTRAVENOUS | Status: DC | PRN
  Administered 2016-07-12 (×2): 20 mg via INTRAVENOUS
  Administered 2016-07-12: 50 mg via INTRAVENOUS

## 2016-07-12 MED ORDER — HYDROMORPHONE HCL 1 MG/ML IJ SOLN
0.2500 mg | INTRAMUSCULAR | Status: DC | PRN
  Administered 2016-07-12: 0.25 mg via INTRAVENOUS

## 2016-07-12 MED ORDER — LACTATED RINGERS IV SOLN: INTRAVENOUS | Status: DC

## 2016-07-12 MED ORDER — 0.9 % SODIUM CHLORIDE (POUR BTL) OPTIME
TOPICAL | Status: DC | PRN
  Administered 2016-07-12: 1000 mL

## 2016-07-12 MED ORDER — DEXAMETHASONE SODIUM PHOSPHATE 10 MG/ML IJ SOLN
INTRAMUSCULAR | Status: DC | PRN
  Administered 2016-07-12: 10 mg via INTRAVENOUS

## 2016-07-12 MED ORDER — HYDROMORPHONE HCL 1 MG/ML IJ SOLN
INTRAMUSCULAR | Status: AC
  Administered 2016-07-12: 0.25 mg via INTRAVENOUS
  Filled 2016-07-12: qty 1

## 2016-07-12 MED ORDER — MIDAZOLAM HCL 5 MG/5ML IJ SOLN
INTRAMUSCULAR | Status: DC | PRN
  Administered 2016-07-12: 2 mg via INTRAVENOUS

## 2016-07-12 MED ORDER — SODIUM CHLORIDE 0.9 % IJ SOLN
INTRAMUSCULAR | Status: AC
  Filled 2016-07-12: qty 50

## 2016-07-12 MED ORDER — LACTATED RINGERS IR SOLN
Status: DC | PRN
  Administered 2016-07-12: 9000 mL

## 2016-07-12 MED ORDER — DULOXETINE HCL 60 MG PO CPEP
60.0000 mg | ORAL_CAPSULE | Freq: Two times a day (BID) | ORAL | Status: DC
  Administered 2016-07-13 – 2016-07-17 (×9): 60 mg via ORAL
  Filled 2016-07-12 (×10): qty 1

## 2016-07-12 MED ORDER — FENTANYL CITRATE (PF) 250 MCG/5ML IJ SOLN
INTRAMUSCULAR | Status: AC
  Filled 2016-07-12: qty 5

## 2016-07-12 MED ORDER — SUGAMMADEX SODIUM 500 MG/5ML IV SOLN
INTRAVENOUS | Status: DC | PRN
  Administered 2016-07-12: 300 mg via INTRAVENOUS

## 2016-07-12 MED ORDER — KETAMINE HCL 10 MG/ML IJ SOLN
INTRAMUSCULAR | Status: AC
  Filled 2016-07-12: qty 1

## 2016-07-12 MED ORDER — FENTANYL CITRATE (PF) 250 MCG/5ML IJ SOLN
INTRAMUSCULAR | Status: DC | PRN
  Administered 2016-07-12: 50 ug via INTRAVENOUS

## 2016-07-12 MED ORDER — LACTATED RINGERS IV BOLUS (SEPSIS): 1000.0000 mL | Freq: Three times a day (TID) | INTRAVENOUS | Status: AC | PRN

## 2016-07-12 MED ORDER — PROPOFOL 10 MG/ML IV BOLUS
INTRAVENOUS | Status: DC | PRN
  Administered 2016-07-12: 150 mg via INTRAVENOUS

## 2016-07-12 MED ORDER — SUCCINYLCHOLINE CHLORIDE 200 MG/10ML IV SOSY
PREFILLED_SYRINGE | INTRAVENOUS | Status: AC
  Filled 2016-07-12: qty 10

## 2016-07-12 MED ORDER — FLUCONAZOLE 200 MG PO TABS
400.0000 mg | ORAL_TABLET | Freq: Every day | ORAL | Status: AC
  Administered 2016-07-13 – 2016-07-15 (×3): 400 mg via ORAL
  Filled 2016-07-12 (×3): qty 2

## 2016-07-12 MED ORDER — KETAMINE HCL 10 MG/ML IJ SOLN
INTRAMUSCULAR | Status: DC | PRN
  Administered 2016-07-12: 30 mg via INTRAVENOUS

## 2016-07-12 MED ORDER — SCOPOLAMINE 1 MG/3DAYS TD PT72
1.0000 | MEDICATED_PATCH | TRANSDERMAL | Status: DC
  Administered 2016-07-15: 1.5 mg via TRANSDERMAL
  Filled 2016-07-12: qty 1

## 2016-07-12 MED ORDER — ONDANSETRON HCL 4 MG/2ML IJ SOLN
INTRAMUSCULAR | Status: AC
  Filled 2016-07-12: qty 2

## 2016-07-12 MED ORDER — SODIUM CHLORIDE 0.9 % IJ SOLN
INTRAMUSCULAR | Status: DC | PRN
  Administered 2016-07-12: 50 mL

## 2016-07-12 MED ORDER — PROPOFOL 10 MG/ML IV BOLUS
INTRAVENOUS | Status: AC
  Filled 2016-07-12: qty 20

## 2016-07-12 MED ORDER — HYDROMORPHONE HCL 1 MG/ML IJ SOLN
0.5000 mg | INTRAMUSCULAR | Status: DC | PRN
  Administered 2016-07-12: 0.5 mg via INTRAVENOUS
  Administered 2016-07-13 (×2): 2 mg via INTRAVENOUS
  Administered 2016-07-13: 1 mg via INTRAVENOUS
  Administered 2016-07-13 (×2): 2 mg via INTRAVENOUS
  Administered 2016-07-13 – 2016-07-14 (×2): 1 mg via INTRAVENOUS
  Administered 2016-07-14: 2 mg via INTRAVENOUS
  Filled 2016-07-12 (×2): qty 1
  Filled 2016-07-12 (×5): qty 2
  Filled 2016-07-12: qty 1

## 2016-07-12 MED ORDER — BUPIVACAINE-EPINEPHRINE 0.25% -1:200000 IJ SOLN
INTRAMUSCULAR | Status: DC | PRN
  Administered 2016-07-12: 20 mL

## 2016-07-12 MED ORDER — HYDROCODONE-ACETAMINOPHEN 7.5-325 MG PO TABS: 1.0000 | ORAL_TABLET | ORAL | 0 refills | Status: DC | PRN

## 2016-07-12 MED ORDER — STERILE WATER FOR IRRIGATION IR SOLN
Status: DC | PRN
  Administered 2016-07-12: 1000 mL

## 2016-07-12 MED ORDER — PROMETHAZINE HCL 25 MG/ML IJ SOLN: 6.2500 mg | INTRAMUSCULAR | Status: DC | PRN

## 2016-07-12 MED ORDER — LACTATED RINGERS IV SOLN
INTRAVENOUS | Status: DC | PRN
  Administered 2016-07-12 (×3): via INTRAVENOUS

## 2016-07-12 MED ORDER — ALVIMOPAN 12 MG PO CAPS
12.0000 mg | ORAL_CAPSULE | Freq: Two times a day (BID) | ORAL | Status: DC
  Administered 2016-07-13 – 2016-07-17 (×8): 12 mg via ORAL
  Filled 2016-07-12 (×8): qty 1

## 2016-07-12 MED ORDER — BUPIVACAINE HCL (PF) 0.25 % IJ SOLN
INTRAMUSCULAR | Status: AC
Start: 2016-07-12 — End: 2016-07-12
  Filled 2016-07-12: qty 30

## 2016-07-12 MED ORDER — LIDOCAINE 2% (20 MG/ML) 5 ML SYRINGE
INTRAMUSCULAR | Status: AC
  Filled 2016-07-12: qty 5

## 2016-07-12 MED ORDER — ONDANSETRON HCL 4 MG/2ML IJ SOLN
INTRAMUSCULAR | Status: DC | PRN
Start: 2016-07-12 — End: 2016-07-12
  Administered 2016-07-12 (×2): 4 mg via INTRAVENOUS

## 2016-07-12 MED ORDER — SODIUM CHLORIDE 0.9 % IV SOLN
1.0000 g | INTRAVENOUS | Status: AC
Start: 2016-07-13 — End: 2016-07-15
  Administered 2016-07-13 – 2016-07-15 (×3): 1 g via INTRAVENOUS
  Filled 2016-07-12 (×3): qty 1

## 2016-07-12 SURGICAL SUPPLY — 74 items
APPLIER CLIP 5 13 M/L LIGAMAX5 (MISCELLANEOUS)
APPLIER CLIP ROT 10 11.4 M/L (STAPLE)
APR CLP MED LRG 11.4X10 (STAPLE)
APR CLP MED LRG 5 ANG JAW (MISCELLANEOUS)
CABLE HIGH FREQUENCY MONO STRZ (ELECTRODE) ×2 IMPLANT
CELLS DAT CNTRL 66122 CELL SVR (MISCELLANEOUS) IMPLANT
CHLORAPREP W/TINT 26ML (MISCELLANEOUS) ×2 IMPLANT
CLIP APPLIE 5 13 M/L LIGAMAX5 (MISCELLANEOUS) IMPLANT
CLIP APPLIE ROT 10 11.4 M/L (STAPLE) IMPLANT
COUNTER NEEDLE 20 DBL MAG RED (NEEDLE) ×2 IMPLANT
COVER MAYO STAND STRL (DRAPES) ×6 IMPLANT
COVER SURGICAL LIGHT HANDLE (MISCELLANEOUS) IMPLANT
DECANTER SPIKE VIAL GLASS SM (MISCELLANEOUS) ×2 IMPLANT
DRAIN CHANNEL 19F RND (DRAIN) IMPLANT
DRAPE LAPAROSCOPIC ABDOMINAL (DRAPES) ×2 IMPLANT
DRAPE SURG IRRIG POUCH 19X23 (DRAPES) ×2 IMPLANT
DRSG OPSITE POSTOP 4X10 (GAUZE/BANDAGES/DRESSINGS) IMPLANT
DRSG OPSITE POSTOP 4X6 (GAUZE/BANDAGES/DRESSINGS) IMPLANT
DRSG OPSITE POSTOP 4X8 (GAUZE/BANDAGES/DRESSINGS) IMPLANT
DRSG TEGADERM 2-3/8X2-3/4 SM (GAUZE/BANDAGES/DRESSINGS) ×4 IMPLANT
DRSG TEGADERM 4X4.75 (GAUZE/BANDAGES/DRESSINGS) IMPLANT
ELECT PENCIL ROCKER SW 15FT (MISCELLANEOUS) ×4 IMPLANT
ELECT REM PT RETURN 15FT ADLT (MISCELLANEOUS) ×2 IMPLANT
ENDOLOOP SUT PDS II  0 18 (SUTURE)
ENDOLOOP SUT PDS II 0 18 (SUTURE) IMPLANT
EVACUATOR SILICONE 100CC (DRAIN) IMPLANT
GAUZE SPONGE 2X2 8PLY STRL LF (GAUZE/BANDAGES/DRESSINGS) ×1 IMPLANT
GAUZE SPONGE 4X4 12PLY STRL (GAUZE/BANDAGES/DRESSINGS) ×2 IMPLANT
GLOVE ECLIPSE 8.0 STRL XLNG CF (GLOVE) ×4 IMPLANT
GLOVE INDICATOR 8.0 STRL GRN (GLOVE) ×4 IMPLANT
GOWN STRL REUS W/TWL XL LVL3 (GOWN DISPOSABLE) ×8 IMPLANT
IRRIG SUCT STRYKERFLOW 2 WTIP (MISCELLANEOUS) ×2
IRRIGATION SUCT STRKRFLW 2 WTP (MISCELLANEOUS) ×1 IMPLANT
LEGGING LITHOTOMY PAIR STRL (DRAPES) IMPLANT
LUBRICANT JELLY K Y 4OZ (MISCELLANEOUS) IMPLANT
PACK COLON (CUSTOM PROCEDURE TRAY) ×2 IMPLANT
PAD POSITIONING PINK XL (MISCELLANEOUS) ×2 IMPLANT
PORT LAP GEL ALEXIS MED 5-9CM (MISCELLANEOUS) IMPLANT
POSITIONER SURGICAL ARM (MISCELLANEOUS) IMPLANT
RTRCTR WOUND ALEXIS 18CM MED (MISCELLANEOUS)
SCISSORS LAP 5X35 DISP (ENDOMECHANICALS) ×2 IMPLANT
SEALER TISSUE G2 STRG ARTC 35C (ENDOMECHANICALS) IMPLANT
SLEEVE ADV FIXATION 5X100MM (TROCAR) ×2 IMPLANT
SLEEVE XCEL OPT CAN 5 100 (ENDOMECHANICALS) IMPLANT
SPONGE GAUZE 2X2 STER 10/PKG (GAUZE/BANDAGES/DRESSINGS) ×1
SPONGE LAP 18X18 X RAY DECT (DISPOSABLE) IMPLANT
STAPLER GUN LINEAR PROX 60 (STAPLE) ×2 IMPLANT
STAPLER VISISTAT 35W (STAPLE) IMPLANT
SUT DVC VLOC 180 2-0 12IN GS21 (SUTURE) ×2
SUT MNCRL AB 4-0 PS2 18 (SUTURE) ×6 IMPLANT
SUT PDS AB 1 CTX 36 (SUTURE) ×4 IMPLANT
SUT PDS AB 1 TP1 96 (SUTURE) IMPLANT
SUT PROLENE 0 CT 2 (SUTURE) IMPLANT
SUT PROLENE 2 0 SH DA (SUTURE) ×2 IMPLANT
SUT SILK 2 0 (SUTURE) ×2
SUT SILK 2 0 SH CR/8 (SUTURE) ×2 IMPLANT
SUT SILK 2-0 18XBRD TIE 12 (SUTURE) ×1 IMPLANT
SUT SILK 3 0 (SUTURE) ×2
SUT SILK 3 0 SH CR/8 (SUTURE) ×4 IMPLANT
SUT SILK 3-0 18XBRD TIE 12 (SUTURE) ×1 IMPLANT
SUT VIC AB 2-0 SH 18 (SUTURE) ×6 IMPLANT
SUT VICRYL 0 UR6 27IN ABS (SUTURE) ×2 IMPLANT
SUTURE DVC VL 180 2-0 12INGS21 (SUTURE) ×1 IMPLANT
SYS LAPSCP GELPORT 120MM (MISCELLANEOUS) ×2
SYSTEM LAPSCP GELPORT 120MM (MISCELLANEOUS) ×1 IMPLANT
TAPE UMBILICAL COTTON 1/8X30 (MISCELLANEOUS) ×4 IMPLANT
TOWEL OR 17X26 10 PK STRL BLUE (TOWEL DISPOSABLE) IMPLANT
TOWEL OR NON WOVEN STRL DISP B (DISPOSABLE) ×2 IMPLANT
TRAY FOLEY W/METER SILVER 16FR (SET/KITS/TRAYS/PACK) IMPLANT
TROCAR ADV FIXATION 5X100MM (TROCAR) ×2 IMPLANT
TROCAR BLADELESS OPT 5 100 (ENDOMECHANICALS) ×2 IMPLANT
TROCAR XCEL NON-BLD 11X100MML (ENDOMECHANICALS) IMPLANT
TUBING CONNECTING 10 (TUBING) IMPLANT
TUBING INSUF HEATED (TUBING) ×2 IMPLANT

## 2016-07-12 NOTE — Transfer of Care (Signed)
Immediate Anesthesia Transfer of Care Note  Patient: Lindsay Little  Procedure(s) Performed: Procedure(s): LAPAROSCOPIC LYSIS OF ADHESIONS, OMENTOPEXY, HAND ASSISTED RESECTION OF  COLON, END TO END ANASTOMOSIS, COLOSTOMY (N/A)  Patient Location: PACU  Anesthesia Type:General  Level of Consciousness: sedated  Airway & Oxygen Therapy: Patient Spontanous Breathing and Patient connected to face mask oxygen  Post-op Assessment: Report given to RN and Post -op Vital signs reviewed and stable  Post vital signs: Reviewed and stable  Last Vitals:  Vitals:   07/12/16 0544 07/12/16 1124  BP: (!) 105/53 135/69  Pulse: 72 80  Resp: 18 20  Temp: 36.7 C 36.7 C    Last Pain:  Vitals:   07/12/16 1124  TempSrc: Oral  PainSc:       Patients Stated Pain Goal: 2 (08/26/15 3567)  Complications: No apparent anesthesia complications

## 2016-07-12 NOTE — Op Note (Signed)
07/12/2016  7:00 PM  PATIENT:  Lindsay Little  50 y.o. female  Patient Care Team: [name], MD as PCP - General (Family Medicine) Michael Boston, MD as Consulting Physician (General Surgery) Truitt Merle, MD as Consulting Physician (Medical Oncology) Kyung Rudd, MD as Consulting Physician (Radiation Oncology) Teena Irani, MD as Consulting Physician (Gastroenterology) Raynelle Bring, MD as Consulting Physician (Urology)  PRE-OPERATIVE DIAGNOSIS:  COLORECTAL ANASTOMOTIC BREAKDOWN WITH COLOVAGINAL FISTULA  POST-OPERATIVE DIAGNOSIS:  COLORECTAL ANASTOMOTIC BREAKDOWN WITH COLOVAGINAL FISTULA  PROCEDURE:   LAPAROSCOPIC LYSIS OF ADHESIONS LAPAROSCOPIC RESECTION OF COLORECTAL ANASTOMOSIS END COLOSTOMY OVERSEW OF RECTAL STUMP OMENTOPEXY OF RECTAL STUMP & VAGINAL CUFF  SURGEON:  [name], MD  ASSISTANT: RN   ANESTHESIA:   local and general  EBL:  Total I/O In: 2400 [I.V.:2400] Out: 47 [Urine:260; Blood:200]  Delay start of Pharmacological VTE agent (>24hrs) due to surgical blood loss or risk of bleeding:  no  DRAINS: 19 Fr drain with tip in the pelvis  SPECIMEN:  PROXIMAL & DISTAL COLORECTAL ANASTOMOSIS  DISPOSITION OF SPECIMEN:  PATHOLOGY  COUNTS:  YES  PLAN OF CARE: Admit to inpatient   PATIENT DISPOSITION:  PACU - hemodynamically stable.  INDICATION:    Morbidly obese female with bulky rectal cancer status post neoadjuvant chemoradiation therapy and robotically assisted resection.  Developed delayed hematoma and abscess.  Turned in a chronic fistula.  Required loop ileostomy diversion and omental pedicle flap.  Area close down.  No leak by colonoscopy and enema.  Underwent ileostomy takedown without incident.  Five weeks later he noted worsening pain and drainage of vagina suspicious for: Bowel fistula.  CT scan showing anastomotic breakdown.  I recommended endoscopic assisted resection of colorectal anastomosis with permanent end colostomy.  The anatomy &  physiology of the digestive tract was discussed.  The pathophysiology was discussed.  Natural history risks without surgery was discussed.   I worked to give an overview of the disease and the frequent need to have multispecialty involvement.  I feel the risks of no intervention will lead to serious problems that outweigh the operative risks; therefore, I recommended a partial colectomy to remove the pathology.  Laparoscopic & open techniques were discussed.   Risks such as bleeding, infection, abscess, leak, reoperation, possible ostomy, hernia, heart attack, death, and other risks were discussed.  I noted a good likelihood this will help address the problem.   Goals of post-operative recovery were discussed as well.  We will work to minimize complications.  Educational materials on the pathology had been given in the office.  Questions were answered.    The patient expressed understanding & wished to proceed with surgery.  OR FINDINGS:   Patient had stool resting on her coccyx in the very distal posterior and right lateral aspects.  Breakdown of the anastomosis seen to be more posteriorly.  Remaining rectal stump seemed healthy and noninflamed.  About 4 cm stump remains oversewn with the LOC.  No definite evidence of colovaginal fistula breakdown.  Nonetheless omentum used as an omentopexy of her rectal stump and vaginal,.  End colostomy in left upper quadrant along subcostal space along premarked region.  No obvious metastatic disease on visceral parietal peritoneum or liver.  DESCRIPTION:   Informed consent was confirmed.  The patient underwent general anaesthesia without difficulty.  The patient was positioned appropriately.  VTE prevention in place.  The patient's abdomen was clipped, prepped, & draped in a sterile fashion.  Surgical timeout confirmed our plan.  The patient was  positioned in reverse Trendelenburg.  Abdominal entry was gained using optical entry technique in the left upper  abdomen at the upper premarked colostomy site near the subcostal ridge..  Entry was clean.  I induced carbon dioxide insufflation.  Camera inspection revealed no injury.  Extra ports were carefully placed under direct laparoscopic visualization.  I did lap scabbing lesions to free adhesions of small bowel greater omentum off the anterior abdominal wall.  I did further lyse adhesions to help mobilize the greater omental pedicle flap off the pelvis.  Found the left colon going down into the pelvis.  I placed a GelPort through a Pfannenstiel incision.  Blunt and sharp dissection to free the omental pedicle flap out of the pelvis.  Reflected that out of the abdomen.  I then came down the colon circumferentially take him to anastomosis.  I transected that off.  I brought up the colon out the Pfannenstiel GelPort wound protector.  Transected more proximally a few centimeters to more healthy: Tissue.  Placed silk sutures on that to help better identify it.  Lyse the left colon in a lateral to medial fashion along the splenic flexure with some splenic flexure mobilization.  Bilateral left colon to easily reach along the left subcostal ridge.  I then focused on the pelvis.  Transected some remaining ischemic rectal stump.  Scooped out a few pockets of stool sitting on the coccyx and the posterior pelvis.  ion created out remaining rectal stump tied more healthy pink noninflamed tissue.  Fibrous material but no evidence of carcinomatosis.  Did some necrotic omentum as well.  I did copious irrigation of the pelvis of over 6 L.  I did meticulous inspection and saw no defect with the vagina.  The rectal stump was wide open now.  Palpate vaginally and the vaginal cuff was attack without any breech.  I did digital rectal examination and found a forceps and meter rectal stump opening into the pelvis.  I mobilized the ends debrided to healthy tissue.  I then closed the rectal stump using a 20 the LOC serrated suture in a running  fashion.  That help bring things together well.  Was closed by digital rectal exam.  I then returned the greater omentum down into the pelvis.  I used that V lock suture to help suture the omentum over the rectal stump as well as the vaginal, for an omentopexy of the rectal stump and vaginal cuff.  I did a final irrigation of antibiotic solution.  Inspected the abdomen.  Hemostasis was good.  Again the colon could easily reach into the left upper quadrant.  I placed a drain down the pelvis and brought out the 5 mm right lower quadrant port.  Secured with 2-0 Prolene suture.  I placed a clamp on the distal end of the colon to help bring up colostomy.  I evacuate carbon dioxide.  Made a 3/2 cm circular opening in the left subcostal region at the premarked 12 port site.  Removed a cylinder of fat.  I entered the anterior  Rectus facia transversely.  Split through the rectus muscle and posterior rectus fascia vertically.  Brought up the colon.  I was able to exteriorize 7 cm of colon.  An abraded off some redundant epiploic appendages to helps in and out of the bit better as it was rather bulky and inflamed.  I covered to protect that area.  We changed gloves.  We did diagnostic laparoscopy.  We aspirated the antibiotic irrigation.  Hemostasis was good.  I closed the port sites with 4 Monocryl suture and sterile dressings.  I closed the Pfannenstiel wound with a #1 PDS suture in the retrorectus tissues vertically.  I then closed the anterior rectus fascia with #1 PDS transversely.  I closed the skin with 4-0 Monocryl suture.  I placed antibiotic-soaked wicks at the corners & in the midline.  Sterile dressing applied.  Then focused on colostomy creation.  I secured the colostomy to the anterior rectus fascia using 2-0 Vicryl interrupted suture at four corners.  Then matured the colostomy using 2-0 Vicryl interrupted suture to have a good 3 cm rosebud Brooke colostomy.  Colostomy appliance placed.   Patient is  being extubated go to recovery room. I discussed postop care with the patient in detail the office & in the holding area. Instructions are written.  I'm about to locate her husband & family and discuss it with them as well.  [name], M.D., F.A.C.S. Gastrointestinal and Minimally Invasive Surgery [facility] [address], [place], [place] [address] [phone] Main / Paging

## 2016-07-12 NOTE — H&P (View-Only) (Signed)
[facility]  [address] [place], [place] [address] Phone: [phone] FAX: [phone]   Lindsay Little [id number] Mar 11, 1966    Problem List:   Principal Problem:   Colovaginal fistula Active Problems:   Rectal adenocarcinoma s/p LAR resection 05/25/2015   Morbid obesity with BMI of 40.0-44.9, adult [facility])   Colorectal delayed anastomotic leak - healed, s/p ileostomy takedown 05/31/2016   Rectocutaneous fistula           Assessment  Fair  Plan:  -IV ABx -gentle bowel prep -anxiolysis -control fibromyalgia -VTE prophylaxis- SCDs, etc -mobilize as tolerated to help recovery  Plan takedown of anastomsis & repair of colovaginal fistula tomorrow:  The anatomy & physiology of the digestive tract was discussed.  The pathophysiology of colorectal fistula to the vagina was discussed.  Natural history risks without surgery was discussed. I worked to give an overview of the disease and the frequent need to have multispecialty involvement.   I feel the risks of no intervention will lead to serious problems that outweigh the operative risks; therefore, I recommended surgery to treat the pathology.  Minimally Invasive (robotic/laparoscopic) & open techniques for probable colectomy were discussed.  Repair of the vaginal opening with possible need for intervening material to prevent recurrence such as omentum, muscle, mesh, etc was discussed.  Possible fecal diversion by ostomy was discussed.  We will work to preserve anal & pelvic floor function without sacrificing cure.  Risks such as bleeding, infection, abscess, leak, injury to other organs, need for repair of tissues / organs, recurrence with reoperation, possible ostomy, hernia, heart attack, death, and other risks were discussed.  I noted a good likelihood this will help address the problem.   Goals of post-operative recovery were discussed as well.  We will work to minimize  complications.  Educational information was given as well.  Questions were answered.    The patient expresses understanding & wishes to proceed with surgery.   Lindsay Little, M.D., F.A.C.S. Gastrointestinal and Minimally Invasive Surgery [facility] [address] [place], [place] [address] [phone] Main / Paging   07/11/2016  CARE TEAM:  PCP: Vidal Schwalbe, MD  Outpatient Care Team: Patient Care Team: [name], MD as PCP - General (Family Medicine) Michael Boston, MD as Consulting Physician (General Surgery) Truitt Merle, MD as Consulting Physician ([place]) Kyung Rudd, MD as Consulting Physician ([place]) Teena Irani, MD as Consulting Physician (Gastroenterology) Raynelle Bring, MD as Consulting Physician (Urology)  Inpatient Treatment Team: Treatment Team: Attending Provider: Michael Boston, MD; Registered Nurse: Terrial Rhodes, RN  Subjective:  Lindsay Little better controlled Sore in perineum  Objective:  Vital signs:  Vitals:   07/10/16 1835 07/10/16 2200 07/11/16 0621  BP: (!) 144/93 101/60 (!) 115/56  Pulse: 90 70 60  Resp: _0 Temp: 97.8 F (36.6 C) 97.8 F (36.6 C) 97.8 F (36.6 C)  TempSrc: Oral Oral Oral  SpO2: 96% 99% 98%  Weight: 133.1 kg (293 lb 6.9 oz)    Height: _1  (1.778 m)      Last BM Date: 07/11/16  Intake/Output   Yesterday:  12/27 0701 - 12/28 0700 In: 1623.3 [I.V.:573.3; IV Piggyback:1050] Out: -  This shift:  No intake/output data recorded.  Bowel function:  Flatus: YES  BM:  YES  Drain: Feculent mild out right gluteal wick   Physical Exam:  General: Pt awake/alert/oriented x4 in mild acute distress Eyes: PERRL, normal  EOM.  Sclera clear.  No icterus Neuro: CN II-XII intact w/o focal sensory/motor deficits. Lymph: No head/neck/groin lymphadenopathy Psych:  No delerium/psychosis/paranoia.  Tearful but consolable HENT: Normocephalic, Mucus membranes moist.   No thrush Neck: Supple, No tracheal deviation Chest: No chest wall pain w good excursion CV:  Pulses intact.  Regular rhythm MS: Normal AROM mjr joints.  No obvious deformity Abdomen: Soft.  Nondistended.  Nontender.  No evidence of peritonitis.  No incarcerated hernias. GU - feculent drainage Rectal: Some liquid diarrhea.  Wick in place with less erythema Ext:  SCDs BLE.  No mjr edema.  No cyanosis Skin: No petechiae / purpura  Results:   Labs: Results for orders placed or performed during the hospital encounter of 07/10/16 (from the past 48 hour(s))  Comprehensive metabolic panel     Status: Abnormal   Collection Time: 07/10/16  8:00 PM  Result Value Ref Range   Sodium 140 135 - 145 mmol/L   Potassium 3.8 3.5 - 5.1 mmol/L   Chloride 105 101 - 111 mmol/L   CO2 25 22 - 32 mmol/L   Glucose, Bld 145 (H) 65 - 99 mg/dL   BUN 13 6 - 20 mg/dL   Creatinine, Ser 0.65 0.44 - 1.00 mg/dL   Calcium 9.0 8.9 - 10.3 mg/dL   Total Protein 6.5 6.5 - 8.1 g/dL   Albumin 3.0 (L) 3.5 - 5.0 g/dL   AST 18 15 - 41 U/L   ALT 12 (L) 14 - 54 U/L   Alkaline Phosphatase 66 38 - 126 U/L   Total Bilirubin 0.3 0.3 - 1.2 mg/dL   GFR calc non Af Amer >60 >60 mL/min   GFR calc Af Amer >60 >60 mL/min    Comment: (NOTE) The eGFR has been calculated using the CKD EPI equation. This calculation has not been validated in all clinical situations. eGFR's persistently <60 mL/min signify possible Chronic Kidney Disease.    Anion gap 10 5 - 15  CBC WITH DIFFERENTIAL     Status: Abnormal   Collection Time: 07/10/16  8:00 PM  Result Value Ref Range   WBC 7.9 4.0 - 10.5 K/uL   RBC 3.55 (L) 3.87 - 5.11 MIL/uL   Hemoglobin 9.4 (L) 12.0 - 15.0 g/dL   HCT 29.9 (L) 36.0 - 46.0 %   MCV 84.2 78.0 - 100.0 fL   MCH 26.5 26.0 - 34.0 pg   MCHC 31.4 30.0 - 36.0 g/dL   RDW 13.8 11.5 - 15.5 %   Platelets 399 150 - 400 K/uL   Neutrophils Relative % 77 %   Neutro Abs 6.1 1.7 - 7.7 K/uL   Lymphocytes Relative 14 %   Lymphs  Abs 1.1 0.7 - 4.0 K/uL   Monocytes Relative 5 %   Monocytes Absolute 0.4 0.1 - 1.0 K/uL   Eosinophils Relative 4 %   Eosinophils Absolute 0.3 0.0 - 0.7 K/uL   Basophils Relative 0 %   Basophils Absolute 0.0 0.0 - 0.1 K/uL    Imaging / Studies: No results found.  Medications / Allergies: per chart  Antibiotics: Anti-infectives    Start     Dose/Rate Route Frequency Ordered Stop   07/12/16 1000  clindamycin (CLEOCIN) IVPB 900 mg     900 mg 100 mL/hr over 30 Minutes Intravenous 60 min pre-op 07/10/16 1841     07/12/16 1000  gentamicin (GARAMYCIN) 470 mg in dextrose 5 % 100 mL IVPB     5 mg/kg  94.3 kg (Adjusted) 111.8  mL/hr over 60 Minutes Intravenous 60 min pre-op 07/10/16 1841     07/12/16 1000  clindamycin (CLEOCIN) 900 mg, gentamicin (GARAMYCIN) 240 mg in sodium chloride 0.9 % 1,000 mL for intraperitoneal lavage      Intraperitoneal To Surgery 07/10/16 1841 07/13/16 1000   07/11/16 1400  neomycin (MYCIFRADIN) tablet 1,000 mg     1,000 mg Oral 3 times per day 07/10/16 1844 07/12/16 1359   07/10/16 2015  ertapenem (INVANZ) 1 g in sodium chloride 0.9 % 50 mL IVPB     1 g 100 mL/hr over 30 Minutes Intravenous Every 24 hours 07/10/16 1848     07/10/16 1900  fluconazole (DIFLUCAN) tablet 200 mg     200 mg Oral Daily 07/10/16 1848     07/10/16 1841  neomycin (MYCIFRADIN) tablet 1,000 mg  Status:  Discontinued     1,000 mg Oral 3 times per day 07/10/16 1841 07/10/16 1844   07/10/16 1841  metroNIDAZOLE (FLAGYL) tablet 1,000 mg  Status:  Discontinued     1,000 mg Oral 3 times per day 07/10/16 1841 07/10/16 1844        Note: Portions of this report may have been transcribed using voice recognition software. Every effort was made to ensure accuracy; however, inadvertent computerized transcription errors may be present.   Any transcriptional errors that result from this process are unintentional.     Lindsay Little, M.D., F.A.C.S. Gastrointestinal and Minimally Invasive  Surgery [facility] [address], [place], [place] [address] [phone] Main / Paging   07/11/2016

## 2016-07-12 NOTE — Anesthesia Preprocedure Evaluation (Signed)
Anesthesia Evaluation  Patient identified by MRN, date of birth, ID band Patient awake    Reviewed: Allergy & Precautions, H&P , NPO status , Patient's Chart, lab work & pertinent test results  History of Anesthesia Complications (+) PONV and history of anesthetic complications  Airway Mallampati: II  TM Distance: >3 FB Neck ROM: Full    Dental no notable dental hx. (+) Teeth Intact, Dental Advisory Given   Pulmonary sleep apnea , former smoker,    Pulmonary exam normal breath sounds clear to auscultation       Cardiovascular hypertension, Pt. on medications + Valvular Problems/Murmurs  Rhythm:Regular Rate:Normal     Neuro/Psych negative neurological ROS  negative psych ROS   GI/Hepatic Neg liver ROS, hiatal hernia, GERD  Medicated and Controlled,  Endo/Other  Morbid obesity  Renal/GU negative Renal ROS     Musculoskeletal  (+) Arthritis , Osteoarthritis,    Abdominal   Peds  Hematology negative hematology ROS (+) anemia ,   Anesthesia Other Findings   Reproductive/Obstetrics negative OB ROS                             Anesthesia Physical  Anesthesia Plan  ASA: III  Anesthesia Plan: General   Post-op Pain Management:    Induction: Intravenous  Airway Management Planned: Oral ETT  Additional Equipment:   Intra-op Plan:   Post-operative Plan: Extubation in OR  Informed Consent: I have reviewed the patients History and Physical, chart, labs and discussed the procedure including the risks, benefits and alternatives for the proposed anesthesia with the patient or authorized representative who has indicated his/her understanding and acceptance.   Dental advisory given  Plan Discussed with: CRNA  Anesthesia Plan Comments:         Anesthesia Quick Evaluation

## 2016-07-12 NOTE — Anesthesia Procedure Notes (Signed)
Procedure Name: Intubation Date/Time: 07/12/2016 3:06 PM Performed by: West Pugh Pre-anesthesia Checklist: Patient identified, Emergency Drugs available, Suction available, Patient being monitored and Timeout performed Patient Re-evaluated:Patient Re-evaluated prior to inductionOxygen Delivery Method: Circle system utilized Preoxygenation: Pre-oxygenation with 100% oxygen Intubation Type: IV induction Ventilation: Mask ventilation without difficulty Laryngoscope Size: Mac and 4 Grade View: Grade I Tube type: Oral Tube size: 7.5 mm Number of attempts: 1 Airway Equipment and Method: Stylet Placement Confirmation: ETT inserted through vocal cords under direct vision,  positive ETCO2,  CO2 detector and breath sounds checked- equal and bilateral Secured at: 22 cm Tube secured with: Tape Dental Injury: Teeth and Oropharynx as per pre-operative assessment

## 2016-07-12 NOTE — Anesthesia Postprocedure Evaluation (Signed)
Anesthesia Post Note  Patient: Lindsay Little  Procedure(s) Performed: Procedure(s) (LRB): LAPAROSCOPIC LYSIS OF ADHESIONS, OMENTOPEXY, HAND ASSISTED RESECTION OF  COLON, END TO END ANASTOMOSIS, COLOSTOMY (N/A)  Patient location during evaluation: PACU Anesthesia Type: General Level of consciousness: sedated and patient cooperative Pain management: pain level controlled Vital Signs Assessment: post-procedure vital signs reviewed and stable Respiratory status: spontaneous breathing Cardiovascular status: stable Anesthetic complications: no       Last Vitals:  Vitals:   07/12/16 1930 07/12/16 1945  BP: 130/73 121/67  Pulse: 74 70  Resp: 16 15  Temp:      Last Pain:  Vitals:   07/12/16 2000  TempSrc:   PainSc: (P) [name]

## 2016-07-12 NOTE — Discharge Instructions (Signed)
Ostomy Support Information ° °You’ve heard that people get along just fine with only one of their eyes, or one of their lungs, or one of their kidneys. But you also know that you have only one intestine and only one bladder, and that leaves you feeling awfully empty, both physically and emotionally: You think no other people go around without part of their intestine with the ends of their intestines sticking out through their abdominal walls.  ° °YOU ARE NOT ALONE.  There are nearly three quarters of a million people in the [place] who have an ostomy; people who have had surgery to remove all or part of their colons or bladders.  ° °There is even a national association, the [employer] with over 350 local affiliated support groups that are organized by volunteers who provide peer support and counseling. [employer] has a toll free telephone num-ber, 800-826-0826 and an educational, interactive website, www.ostomy.org  ° °An ostomy is an opening in the belly (abdominal wall) made by surgery. Ostomates are people who have had this procedure. The opening (stoma) allows the kidney or bowel to discharge waste. An external pouch covers the stoma to collect waste. Pouches are are a simple bag and are odor free. Different companies have disposable or reusable pouches to fit one's lifestyle. An ostomy can either be temporary or permanent.  ° °THERE ARE THREE MAIN TYPES OF OSTOMIES °· Colostomy. A colostomy is a surgically created opening in the large intestine (colon). °· Ileostomy. An ileostomy is a surgically created opening in the small intestine. °· Urostomy. A urostomy is a surgically created opening to divert urine away from the bladder. ° °OSTOMY Care ° °The following guidelines will make care of your colostomy easier. Keep this information close by for quick reference. ° °Helpful DIET hints °Eat a well-balanced diet including vegetables and fresh fruits. Eat on a regular schedule. Drink at least 6 to 8  glasses of fluids daily. °Eat slowly in a relaxed atmosphere. Chew your food thoroughly. Avoid chewing gum, smoking, and drinking from a straw. This will help decrease the amount of air you swallow, which may help reduce gas. °Eating yogurt or drinking buttermilk may help reduce gas. ° °To control gas at night, do not eat after 8 p.m. This will give your bowel time to quiet down before you go to bed. ° °If gas is a problem, you can purchase Beano. Sprinkle Beano on the first bite of food before eating to reduce gas. It has no flavor and should not change the taste of your food. You can buy Beano over the counter at your local drugstore. ° °Foods like fish, onions, garlic, broccoli, asparagus, and cabbage produce odor. Although your pouch is odor-proof, if you eat these foods you may notice a stronger odor when emptying your pouch. If this is a concern, you may want to limit these foods in your diet. ° °If you have an ileostomy, you will have chronic diarrhea & need to drink more liquids to avoid getting dehydrated.  Consider antidiarrheal medicine like imodium (loperamide) or Lomotil to help slow down bowel movements / diarrhea into your ileostomy bag. ° °GETTING TO GOOD BOWEL HEALTH WITH AN ILEOSTOMY °. °Irregular bowel habits such as constipation and diarrhea can lead to many problems over time.  The goal: 3-6 small BOWEL MOVEMENTS A DAY!  To have soft, regular bowel movements:  °• Drink plenty of fluids, consider 4-6 tall glasses of water a day.   ° °Controlling   diarrheA ° °o Switch to liquids and simpler foods for a few days to avoid stressing your intestines further. °o Avoid dairy products (especially milk & ice cream) for a short time.  The intestines often can lose the ability to digest lactose when stressed. °o Avoid foods that cause gassiness or bloating.  Typical foods include beans and other legumes, cabbage, broccoli, and dairy foods.  Every person has some sensitivity to other foods, so listen to our  body and avoid those foods that trigger problems for you. °o Adding fiber (Citrucel, Metamucil, psyllium, Miralax) gradually can help thicken stools by absorbing excess fluid and retrain the intestines to act more normally.  Slowly increase the dose over a few weeks.  Too much fiber too soon can backfire and cause cramping & bloating. °o Probiotics (such as active yogurt, Align, etc) may help repopulate the intestines and colon with normal bacteria and calm down a sensitive digestive tract.  Most studies show it to be of mild help, though, and such products can be costly. °o Medicines: °- Bismuth subsalicylate (ex. Kayopectate, Pepto Bismol) every 30 minutes for up to 6 doses can help control diarrhea.  Avoid if pregnant. °- Loperamide (Immodium) can slow down diarrhea.  Start with two tablets (4mg total) first and then try one tablet every 6 hours.  Avoid if you are having fevers or severe pain.  If you are not better or start feeling worse, stop all medicines and call your doctor for advice °o Call your doctor if you are getting worse or not better.  Sometimes further testing (cultures, endoscopy, X-ray studies, bloodwork, etc) may be needed to help diagnose and treat the cause of the diarrhea. ° °TROUBLESHOOTING IRREGULAR BOWELS °1) Avoid extremes of bowel movements (no bad constipation/diarrhea) °2) Miralax 17gm mixed in 8oz. water or juice-daily. May use twice a day as needed  °3) Gas-x,Phazyme, etc. as needed for gas & bloating.  °4) Soft,bland diet. No spicy,greasy,fried foods.  °5) Prilosec (omeprazole) over-the-counter as needed  °6) May hold gluten/wheat products from diet to see if symptoms improve.  °7)  May try probiotics (Align, Activa, etc) to help calm the bowels down °7) If symptoms become worse call back immediately. ° ° °Applying the pouching system °To apply your pouch, follow these steps: ° °Place all your equipment close at hand before removing your pouch. ° °Wash your hands. ° °Stand or sit in  front of a mirror. Use the position that works best for you. Remember that you must keep the skin around the stoma wrinkle-free for a good seal. ° °Gently remove the used pouch (1-piece system) or the pouch and old wafer (2-piece system). Empty the pouch into the toilet. Save the closure clip to use again. ° °Wash the stoma itself and the skin around the stoma. Your stoma may bleed a little when being washed. This is normal. Rinse and pat dry. You may use a wash cloth or soft paper towels (like Bounty), mild soap (like Dial, Safeguard, or Ivory), and water. Avoid soaps that contain perfumes or lotions. ° °For a new pouch (1-piece system) or a new wafer (2-piece system), measure your stoma using the stoma guide in each box of supplies. ° °Trace the shape of your stoma onto the back of the new pouch or the back of the new wafer. Cut out the opening. Remove the paper backing and set it aside. ° °Optional: Apply a skin barrier powder to surrounding skin if it is irritated (bare or weeping),   and dust off the excess. °Optional: Apply a skin-prep wipe (such as Skin Prep or All-Kare) to the skin around the stoma, and let it dry. Do not apply this solution if the skin is irritated (red, tender, or broken) or if you have shaved around the stoma. °Optional: Apply a skin barrier paste (such as Stomahesive, Coloplast, or Premium) around the opening cut in the back of the pouch or wafer. Allow it to dry for 30 to 60 seconds. ° °Hold the pouch (1-piece system) or wafer (2-piece system) with the sticky side toward your body. Make sure the skin around the stoma is wrinkle-free. Center the opening on the stoma, then press firmly to your abdomen (Fig. 4). Look in the mirror to check if you are placing the pouch, or wafer, in the right position. For a 2-piece system, snap the pouch onto the wafer. Make sure it snaps into place securely. ° °Place your hand over the stoma and the pouch or wafer for about 30 seconds. The heat from your  hand can help the pouch or wafer stick to your skin. ° °Add deodorant (such as Super Banish or Nullo) to your pouch. Other options include food extracts such as vanilla oil and peppermint extract. Add about 10 drops of the deodorant to the pouch. Then apply the closure clamp. Note: Do not use toxic ° chemicals or commercial cleaning agents in your pouch. These substances may harm the stoma. ° °Optional: For extra seal, apply tape to all 4 sides around the pouch or wafer, as if you were framing a picture. You may use any brand of medical adhesive tape. °Change your pouch every 5 to 7 days. Change it immediately if a leak occurs.  Wash your hands afterwards. ° °If you are wearing a 2-piece system, you may use 2 new pouches per week and alternate them. Rinse the pouch with mild soap and warm water and hang it to dry for the next day. Apply the fresh pouch. Alternate the 2 pouches like this for a week. After a week, change the wafer and begin with 2 new pouches. Place the old pouches in a plastic bag, and put them in the trash. ° ° ° °Tips for colostomy care ° °Applying Your Pouch °You may stand or sit to apply your pouch. ° °Keep the skin where you apply the pouch wrinkle-free. If the skin around the pouch is wrinkled, the seal may break when your skin stretches. ° °If hair grows close to your stoma, you may trim off the hair with scissors, an electric razor, or a safety razor. ° °Always have a mirror nearby so you can get a better view of your stoma. ° °When you apply a new pouch, write the date on the adhesive tape. This will remind you of when you last changed your pouch. ° °Changing Your Pouch °The best time to change your pouch is in the morning, before eating or drinking anything. Your stoma can function at any time, but it will function more after eating or drinking. ° °Emptying Your Pouch °Empty your pouch when it is one-third full (of urine, stool, and/or gas). If you wait until your pouch is fuller than this,  it will be more difficult to empty and more noticeable. °When you empty your pouch, either put toilet paper in the toilet bowl first, or flush the toilet while you empty the pouch. This will reduce splashing. You can empty the pouch between your legs or to one side while sitting,   or while standing or stooping. If you have a 2-piece system, you can snap off the pouch to empty it. Remember that your stoma may function during this time. °If you wish to rinse your pouch after you empty it, a turkey baster can be helpful. When using a baster, squirt water up into the pouch through the opening at the °bottom. With a 2-piece system, you can snap off the pouch to rinse it. After rinsing  your pouch, empty it into the toilet. °When rinsing your pouch at home, put a few granules of Dreft soap in the rinse water. This helps lubricate and freshen your pouch. °The inside of your pouch can be sprayed with non-stick cooking oil (Pam spray). This may help reduce stool sticking to the inside of the pouch. ° °Bathing °You may shower or bathe with your pouch on or off. Remember that your stoma may function during this time. ° °The materials you use to wash your stoma and the skin around it should be clean, but they do not need to be sterile. ° °Wearing Your Pouch °During hot weather, or if you perspire a lot in general, wear a cover over your pouch. This may prevent a rash on your skin under the pouch. Pouch covers are sold at ostomy supply stores. °Wear the pouch inside your underwear for better support. °Watch your weight. Any gain or loss of 10 to 15 pounds or more can change the way your pouch fits. ° °Going Away From Home °A collapsible cup (like those that come in travel kits) or a soft plastic squirt bottle with a pull-up top (like a travel bottle for shampoo) can be used for rinsing your pouch when you are away from home. Tilt the opening of the pouch at an upward angle when using a cup to rinse. ° °Carry wet wipes or extra  tissues to use in public bathrooms. ° °Carry an extra pouching system with you at all times. ° °Never keep ostomy supplies in the glove compartment of your car. Extreme heat or cold can damage the skin barriers and adhesive wafers on the pouch. ° °When you travel, carry your ostomy supplies with you at all times. Keep them within easy reach. Do not pack ostomy supplies in baggage that will be checked or otherwise separated from you, because your baggage might be lost. If you’re traveling out of the country, it is helpful to have a letter stating that you are carrying ostomy supplies as a medical necessity. ° °If you need ostomy supplies while traveling, look in the yellow pages of the telephone book under “Surgical Supplies.” Or call the local ostomy organization to find out where supplies are available. ° °Do not let your ostomy supplies get low. Always order new pouches before you use the last one. ° °Reducing Odor °Limit foods such as broccoli, cabbage, onions, fish, and garlic in your diet to help reduce odor. °Each time you empty your pouch, carefully clean the opening of the pouch, both inside and outside, with toilet paper. °Rinse your pouch 1 or 2 times daily after you empty it (see directions for emptying your pouch and going away from home). °Add deodorant (such as Super Banish or Nullo) to your pouch. °Use air deodorizers in your bathroom. °Do not add aspirin to your pouch. Even though aspirin can help prevent odor, it could cause ulcers on your stoma. ° °When to call the doctor °Call the doctor if you have any of the following symptoms: °Purple, black,   or white stoma Severe cramps lasting more than 6 hours Severe watery discharge from the stoma lasting more than 6 hours No output from the colostomy for 3 days Excessive bleeding from your stoma Swelling of your stoma to more than 1/2-inch larger than usual Pulling inward of your stoma below skin level Severe skin irritation or deep ulcers Bulging  or other changes in your abdomen  When to call your ostomy nurse Call your ostomy/enterostomal therapy (ET) nurse if any of the following occurs: Frequent leaking of your pouching system Change in size or appearance of your stoma, causing discomfort or problems with your pouch Skin rash or rawness Weight gain or loss that causes problems with your pouch      FREQUENTLY ASKED QUESTIONS   Why havent you met any of these folks who have an ostomy?  Well, maybe you have! You just did not recognize them because an ostomy doesn't show. It can be kept secret if you wish. Why, maybe some of your best friends, office associates or neighbors have an ostomy ... you never can tell.   People facing ostomy surgery have many quality-of-life questions like:  Will you bulge? Smell? Make noises? Will you feel waste leaving your body? Will you be a captive of the toilet? Will you starve? Be a social outcast? Get/stay married? Have babies? Easily bathe, go swimming, bend over?  OK, lets look at what you can expect:   Will you bulge?  Remember, without part of the intestine or bladder, and its contents, you should have a flatter tummy than before. You can expect to wear, with little exception, what you wore before surgery ... and this in-cludes tight clothing and bathing suits.   Will you smell?  Today, thanks to modern odor proof pouching systems, you can walk into an ostomy support group meeting and not smell anything that is foul or offensive. And, for those with an ileostomy or colostomy who are concerned about odor when emptying their pouch, there are in-pouch deodorants that can be used to eliminate any waste odors that may exist.   Will you make noises?  Everyone produces gas, especially if they are an air-swallower. But intestinal sounds that occur from time to time are no differ-ent than a gurgling tummy, and quite often your clothing will muffle any sounds.   Will you feel the waste discharges?   For those with a colostomy or ileostomy there might be a slight pressure when waste leaves your body, but understand that the intestines have no nerve endings, so there will be no unpleasant sensations. Those with a urostomy will probably be unaware of any kidney drainage.   Will you be a captive of the toilet?  Immediately post-op you will spend more time in the bathroom than you will after your body recovers from surgery. Every person is different, but on average those with an ileostomy or urostomy may empty their pouches 4 to 6 times a day; a little  less if you have a colostomy. The average wear time between pouch system changes is 3 to 5 days and the changing process should take less than 30 minutes.   Will I need to be on a special diet? Most people return to their normal diet when they have recovered from surgery. Be sure to chew your food well, eat a well-balanced diet and drink plenty of fluids. If you experience problems with a certain food, wait a couple of weeks and try it again.  Will there be  odor and noises? °Pouching systems are designed to be odor-proof or odor-resistant. There are deodorants that can be used in the pouch. Medications are also available to help reduce odor. Limit gas-producing foods and carbonated beverages. You will experience less gas and fewer noises as you heal from surgery. ° °How much time will it take to care for my ostomy? °At first, you may spend a lot of time learning about your ostomy and how to take care of it. As you become more comfortable and skilled at changing the pouching system, it will take very little time to care for it.  ° °Will I be able to return to work? °People with ostomies can perform most jobs. As soon as you have healed from surgery, you should be able to return to work. Heavy lifting (more than 10 pounds) may be discouraged.  ° °What about intimacy? °Sexual relationships and intimacy are important and fulfilling aspects of your life. They  should continue after ostomy surgery. Intimacy-related concerns should be discussed openly between you and your partner.  ° °Can I wear regular clothing? °You do not need to wear special clothing. Ostomy pouches are fairly flat and barely noticeable. Elastic undergarments will not hurt the stoma or prevent the ostomy from functioning.  ° °Can I participate in sports? °An ostomy should not limit your involvement in sports. Many people with ostomies are runners, skiers, swimmers or participate in other active lifestyles. Talk with your caregiver first before doing heavy physical activity. ° °Will you starve?  °Not if you follow doctor’s orders at each stage of your post-op adjustment. There is no such thing as an “ostomy diet”. Some people with an ostomy will be able to eat and tolerate anything; others may find diffi-culty with some foods. Each person is an individual and must determine, by trial, what is best for them. A good practice for all is to drink plenty of water.  ° °Will you be a social outcast?  °Have you met anyone who has an ostomy and is a social outcast? Why should you be the first? Only your attitude and self image will effect how you are treated. No confi-dent person is an outcast.  ° ° ° °PROFESSIONAL HELP  °Resources are available if you need help or have questions about your ostomy.  ° °· Specially trained nurses called Wound, Ostomy Continence Nurses (WOCN) are available for consultation in most major medical centers. ° °· Consider getting an ostomy consult with Kathy Probst at [facility] to help troubleshoot stoma pouch fittings and other issues with your ostomy: [phone] ° °· The United Ostomy Association (UOA) is a group made up of many local chapters throughout the [place]. These local groups hold meetings and provide support to prospective and existing ostomates. They sponsor educational events and have qualified visitors to make personal or telephone visits. Contact  the UOA for the chapter nearest you and for other educational publications. ° °· More detailed information can be found in Colostomy Guide, a publication of the United Ostomy Association (UOA). Contact UOA at 1-800-826-0826 or visit their web site at www.uoaa.org. The website contains links to other sites, suppliers and resources. ° °· [facility] Secure Start Services: °· Start at the website to enlist for support.  Your Wound Ostomy (WOCN) nurse may have started this process. https://www.[facility].com/en/securestart °· Secure Start services are designed to support people as they live their lives with an ostomy or neurogenic bladder. Enrolling is easy and at no cost to the   patient. We realize that each person's needs and life journey are different. Through Secure Start services, we want to help people live their life, their way.  SURGERY: POST OP INSTRUCTIONS (Surgery for small bowel obstruction, colon resection, etc)   ######################################################################  EAT Gradually transition to a high fiber diet with a fiber supplement over the next few days after discharge  WALK Walk an hour a day.  Control your pain to do that.    CONTROL PAIN Control pain so that you can walk, sleep, tolerate sneezing/coughing, go up/down stairs.  HAVE A BOWEL MOVEMENT DAILY Keep your bowels regular to avoid problems.  OK to try a laxative to override constipation.  OK to use an antidairrheal to slow down diarrhea.  Call if not better after 2 tries  CALL IF YOU HAVE PROBLEMS/CONCERNS Call if you are still struggling despite following these instructions. Call if you have concerns not answered by these instructions  ######################################################################   DIET Follow a light diet the first few days at home.  Start with a bland diet such as soups, liquids, starchy foods, low fat foods, etc.  If you feel full, bloated, or constipated, stay on a ful  liquid or pureed/blenderized diet for a few days until you feel better and no longer constipated. Be sure to drink plenty of fluids every day to avoid getting dehydrated (feeling dizzy, not urinating, etc.). Gradually add a fiber supplement to your diet over the next week.  Gradually get back to a regular solid diet.  Avoid fast food or heavy meals the first week as you are more likely to get nauseated. It is expected for your digestive tract to need a few months to get back to normal.  It is common for your bowel movements and stools to be irregular.  You will have occasional bloating and cramping that should eventually fade away.  Until you are eating solid food normally, off all pain medications, and back to regular activities; your bowels will not be normal. Focus on eating a low-fat, high fiber diet the rest of your life (See Getting to [place], below).  CARE of your INCISION or WOUND It is good for closed incision and even open wounds to be washed every day.  Shower every day.  Short baths are fine.  Wash the incisions and wounds clean with soap & water.    If you have a closed incision(s), wash the incision with soap & water every day.  You may leave closed incisions open to air if it is dry.   You may cover the incision with clean gauze & replace it after your daily shower for comfort. If you have skin tapes (Steristrips) or skin glue (Dermabond) on your incision, leave them in place.  They will fall off on their own like a scab.  You may trim any edges that curl up with clean scissors.  If you have staples, set up an appointment for them to be removed in the office in 10 days after surgery.  If you have a drain, wash around the skin exit site with soap & water and place a new dressing of gauze or band aid around the skin every day.  Keep the drain site clean & dry.    If you have an open wound with packing, see wound care instructions.  In general, it is encouraged that you remove your  dressing and packing, shower with soap & water, and replace your dressing once a day.  Pack the  wound with clean gauze moistened with normal (0.9%) saline to keep the wound moist & uninfected.  Pressure on the dressing for 30 minutes will stop most wound bleeding.  Eventually your body will heal & pull the open wound closed over the next few months.  °Raw open wounds will occasionally bleed or secrete yellow drainage until it heals closed.  Drain sites will drain a little until the drain is removed.  Even closed incisions can have mild bleeding or drainage the first few days until the skin edges scab over & seal.   °If you have an open wound with a wound vac, see wound vac care instructions. ° ° ° ° °ACTIVITIES as tolerated °Start light daily activities --- self-care, walking, climbing stairs-- beginning the day after surgery.  Gradually increase activities as tolerated.  Control your pain to be active.  Stop when you are tired.  Ideally, walk several times a day, eventually an hour a day.   °Most people are back to most day-to-day activities in a few weeks.  It takes 4-8 weeks to get back to unrestricted, intense activity. °If you can walk 30 minutes without difficulty, it is safe to try more intense activity such as jogging, treadmill, bicycling, low-impact aerobics, swimming, etc. °Save the most intensive and strenuous activity for last (Usually 4-8 weeks after surgery) such as sit-ups, heavy lifting, contact sports, etc.  Refrain from any intense heavy lifting or straining until you are off narcotics for pain control.  You will have off days, but things should improve week-by-week. °DO NOT PUSH THROUGH PAIN.  Let pain be your guide: If it hurts to do something, don't do it.  Pain is your body warning you to avoid that activity for another week until the pain goes down. °You may drive when you are no longer taking narcotic prescription pain medication, you can comfortably wear a seatbelt, and you can safely make  sudden turns/stops to protect yourself without hesitating due to pain. °You may have sexual intercourse when it is comfortable. If it hurts to do something, stop. ° °MEDICATIONS °Take your usually prescribed home medications unless otherwise directed.   °Blood thinners:  °Usually you can restart any strong blood thinners after the second postoperative day.  It is OK to take aspirin right away.    ° If you are on strong blood thinners (warfarin/Coumadin, Plavix, Xerelto, Eliquis, Pradaxa, etc), discuss with your surgeon, medicine PCP, and/or cardiologist for instructions on when to restart the blood thinner & if blood monitoring is needed (PT/INR blood check, etc).   ° ° °PAIN CONTROL °Pain after surgery or related to activity is often due to strain/injury to muscle, tendon, nerves and/or incisions.  This pain is usually short-term and will improve in a few months.  °To help speed the process of healing and to get back to regular activity more quickly, DO THE FOLLOWING THINGS TOGETHER: °1. Increase activity gradually.  DO NOT PUSH THROUGH PAIN °2. Use Ice and/or Heat °3. Try Gentle Massage and/or Stretching °4. Take over the counter pain medication °5. Take Narcotic prescription pain medication for more severe pain ° °Good pain control = faster recovery.  It is better to take more medicine to be more active than to stay in bed all day to avoid medications. °1.  Increase activity gradually °Avoid heavy lifting at first, then increase to lifting as tolerated over the next 6 weeks. °Do not “push through” the pain.  Listen to your body and avoid positions and maneuvers   than reproduce the pain.  Wait a few days before trying something more intense °Walking an hour a day is encouraged to help your body recover faster and more safely.  Start slowly and stop when getting sore.  If you can walk 30 minutes without stopping or pain, you can try more intense activity (running, jogging, aerobics, cycling, swimming, treadmill,  sex, sports, weightlifting, etc.) °Remember: If it hurts to do it, then don’t do it! °2. Use Ice and/or Heat °You will have swelling and bruising around the incisions.  This will take several weeks to resolve. °Ice packs or heating pads (6-8 times a day, 30-60 minutes at a time) will help sooth soreness & bruising. °Some people prefer to use ice alone, heat alone, or alternate between ice & heat.  Experiment and see what works best for you.  Consider trying ice for the first few days to help decrease swelling and bruising; then, switch to heat to help relax sore spots and speed recovery. °Shower every day.  Short baths are fine.  It feels good!  Keep the incisions and wounds clean with soap & water.   °3. Try Gentle Massage and/or Stretching °Massage at the area of pain many times a day °Stop if you feel pain - do not overdo it °4. Take over the counter pain medication °This helps the muscle and nerve tissues become less irritable and calm down faster °Choose ONE of the following over-the-counter anti-inflammatory medications: °Acetaminophen 500mg tabs (Tylenol) 1-2 pills with every meal and just before bedtime (avoid if you have liver problems or if you have acetaminophen in you narcotic prescription) °Naproxen 220mg tabs (ex. Aleve, Naprosyn) 1-2 pills twice a day (avoid if you have kidney, stomach, IBD, or bleeding problems) °Ibuprofen 200mg tabs (ex. Advil, Motrin) 3-4 pills with every meal and just before bedtime (avoid if you have kidney, stomach, IBD, or bleeding problems) °Take with food/snack several times a day as directed for at least 2 weeks to help keep pain / soreness down & more manageable. °5. Take Narcotic prescription pain medication for more severe pain °A prescription for strong pain control is often given to you upon discharge (for example: oxycodone/Percocet, hydrocodone/Norco/Vicodin, or tramadol/Ultram) °Take your pain medication as prescribed. °Be mindful that most narcotic prescriptions  contain Tylenol (acetaminophen) as well - avoid taking too much Tylenol. °If you are having problems/concerns with the prescription medicine (does not control pain, nausea, vomiting, rash, itching, etc.), please call us [phone] to see if we need to switch you to a different pain medicine that will work better for you and/or control your side effects better. °If you need a refill on your pain medication, you must call the office before 4 pm and on weekdays only.  By federal law, prescriptions for narcotics cannot be called into a pharmacy.  They must be filled out on paper & picked up from our office by the patient or authorized caretaker.  Prescriptions cannot be filled after 4 pm nor on weekends.   ° °WHEN TO CALL US [phone] °Severe uncontrolled or worsening pain  °Fever over 101 F (38.5 C) °Concerns with the incision: Worsening pain, redness, rash/hives, swelling, bleeding, or drainage °Reactions / problems with new medications (itching, rash, hives, nausea, etc.) °Nausea and/or vomiting °Difficulty urinating °Difficulty breathing °Worsening fatigue, dizziness, lightheadedness, blurred vision °Other concerns °If you are not getting better after two weeks or are noticing you are getting worse, contact our office [phone] for further advice.  We may   need to adjust your medications, re-evaluate you in the office, send you to the emergency room, or see what other things we can do to help. °The clinic staff is available to answer your questions during regular business hours (8:30am-5pm).  Please don’t hesitate to call and ask to speak to one of our nurses for clinical concerns.    °A surgeon from [facility] is always on call at the hospitals 24 hours/day °If you have a medical emergency, go to the nearest emergency room or call 911. ° °FOLLOW UP in our office °One the day of your discharge from the hospital (or the next business weekday), please call [facility] to set up  or confirm an appointment to see your surgeon in the office for a follow-up appointment.  Usually it is 2-3 weeks after your surgery.   °If you have skin staples at your incision(s), let the office know so we can set up a time in the office for the nurse to remove them (usually around 10 days after surgery). °Make sure that you call for appointments the day of discharge (or the next business weekday) from the hospital to ensure a convenient appointment time. °IF YOU HAVE DISABILITY OR FAMILY LEAVE FORMS, BRING THEM TO THE OFFICE FOR PROCESSING.  DO NOT GIVE THEM TO YOUR DOCTOR. ° °[facility] °[address], [address], [place], [place]  [address] ? °[phone] - Main °[phone] - Toll Free,  [phone] - Fax °[email or web address] ° °GETTING TO GOOD BOWEL HEALTH. °It is expected for your digestive tract to need a few months to get back to normal.  It is common for your bowel movements and stools to be irregular.  You will have occasional bloating and cramping that should eventually fade away.  Until you are eating solid food normally, off all pain medications, and back to regular activities; your bowels will not be normal.   °Avoiding constipation °The goal: ONE SOFT BOWEL MOVEMENT A DAY!    °Drink plenty of fluids.  Choose water first. °TAKE A FIBER SUPPLEMENT EVERY DAY THE REST OF YOUR LIFE °During your first week back home, gradually add back a fiber supplement every day °Experiment which form you can tolerate.   There are many forms such as powders, tablets, wafers, gummies, etc °Psyllium bran (Metamucil), methylcellulose (Citrucel), Miralax or Glycolax, Benefiber, Flax Seed.  °Adjust the dose week-by-week (1/2 dose/day to 6 doses a day) until you are moving your bowels 1-2 times a day.  Cut back the dose or try a different fiber product if it is giving you problems such as diarrhea or bloating. °Sometimes a laxative is needed to help jump-start bowels if constipated  until the fiber supplement can help regulate your bowels.  If you are tolerating eating & you are farting, it is okay to try a gentle laxative such as double dose MiraLax, prune juice, or Milk of Magnesia.  Avoid using laxatives too often. °Stool softeners can sometimes help counteract the constipating effects of narcotic pain medicines.  It can also cause diarrhea, so avoid using for too long. °If you are still constipated despite taking fiber daily, eating solids, and a few doses of laxatives, call our office. °Controlling diarrhea °Try drinking liquids and eating bland foods for a few days to avoid stressing your intestines further. °Avoid dairy products (especially milk & ice cream) for a short time.  The intestines often can lose the ability to digest lactose when stressed. °Avoid foods that   cause gassiness or bloating.  Typical foods include beans and other legumes, cabbage, broccoli, and dairy foods.  Avoid greasy, spicy, fast foods.  Every person has some sensitivity to other foods, so listen to your body and avoid those foods that trigger problems for you. Probiotics (such as active yogurt, Align, etc) may help repopulate the intestines and colon with normal bacteria and calm down a sensitive digestive tract Adding a fiber supplement gradually can help thicken stools by absorbing excess fluid and retrain the intestines to act more normally.  Slowly increase the dose over a few weeks.  Too much fiber too soon can backfire and cause cramping & bloating. It is okay to try and slow down diarrhea with a few doses of antidiarrheal medicines.   Bismuth subsalicylate (ex. Kayopectate, Pepto Bismol) for a few doses can help control diarrhea.  Avoid if pregnant.   Loperamide (Imodium) can slow down diarrhea.  Start with one tablet (88m) first.  Avoid if you are having fevers or severe pain.  ILEOSTOMY PATIENTS WILL HAVE CHRONIC DIARRHEA since their colon is not in use.    Drink plenty of liquids.  You will  need to drink even more glasses of water/liquid a day to avoid getting dehydrated. Record output from your ileostomy.  Expect to empty the bag every 3-4 hours at first.  Most people with a permanent ileostomy empty their bag 4-6 times at the least.   Use antidiarrheal medicine (especially Imodium) several times a day to avoid getting dehydrated.  Start with a dose at bedtime & breakfast.  Adjust up or down as needed.  Increase antidiarrheal medications as directed to avoid emptying the bag more than 8 times a day (every 3 hours). Work with your wound ostomy nurse to learn care for your ostomy.  See ostomy care instructions. TROUBLESHOOTING IRREGULAR BOWELS 1) Start with a soft & bland diet. No spicy, greasy, or fried foods.  2) Avoid gluten/wheat or dairy products from diet to see if symptoms improve. 3) Miralax 17gm or flax seed mixed in [place] water or juice-daily. May use 2-4 times a day as needed. 4) Gas-X, Phazyme, etc. as needed for gas & bloating.  5) Prilosec (omeprazole) over-the-counter as needed 6)  Consider probiotics (Align, Activa, etc) to help calm the bowels down  Call your doctor if you are getting worse or not getting better.  Sometimes further testing (cultures, endoscopy, X-ray studies, CT scans, bloodwork, etc.) may be needed to help diagnose and treat the cause of the diarrhea. [facility] [place]  [phone] Main.    [phone] - Toll Free.   [phone] Fax [email or web address]   WOUND CARE  It is important that the wound be kept open.   -Keeping the skin edges apart will allow the wound to gradually heal from the base upwards.   - If the skin edges of the wound close too early, a new fluid pocket can form and infection can occur. -This is the reason to pack deeper wounds with gauze or ribbon -This is why drained wounds cannot be sewed closed right away  A healthy wound  should form a lining of bright red "beefy" granulating tissue that will help shrink the wound and help the edges grow new skin into it.   -A little mucus / yellow discharge is normal (the body's natural way to try and form a scab) and should be gently washed off with soap  and water with daily dressing changes.  -Green or foul smelling drainage implies bacterial colonization and can slow wound healing - a short course of antibiotic ointment (3-5 days) can help it clear up.  Call the doctor if it does not improve or worsens  -Avoid use of antibiotic ointments for more than a week as they can slow wound healing over time.    -Sometimes other wound care products will be used to reduce need for dressing changes and/or help clean up dirty wounds -Sometimes the surgeon needs to debride the wound in the office to remove dead or infected tissue out of the wound so it can heal more quickly and safely.    Change the dressing at least once a day -Wash the wound with mild soap and water gently every day.  It is good to shower or bathe the wound to help it clean out. -Use clean ribbon plain NU-gauze for small right buttock wound (it does not need to be sterile, just clean) -Keep the raw wound moist with a little saline or KY (saline) gel on the gauze.  -A dry wound will take longer to heal.  -Keep the skin dry around the wound to prevent breakdown and irritation. -Pack the wound down to the base -The goal is to keep the skin apart, not overpack the wound -Use a Q-tip or blunt-tipped kabob stick toothpick to push the gauze down to the base in narrow or deep wounds   -Cover with a clean gauze and tape -paper or Medipore tape tend to be gentle on the skin -rotate the orientation of the tape to avoid repeated stress/trauma on the skin -using an ACE or Coban wrap on wounds on arms or legs can be used instead.  Complete all antibiotics through the entire prescription to help the infection heal and prevent new  places of infection   Returning the see the surgeon is helpful to follow the healing process and help the wound close as fast as possible.

## 2016-07-12 NOTE — Interval H&P Note (Signed)
History and Physical Interval Note:  07/12/2016 2:41 PM  Lindsay Little  has presented today for surgery, with the diagnosis of COLOVAGINAL FISTULA  The various methods of treatment have been discussed with the patient and family. After consideration of risks, benefits and other options for treatment, the patient has consented to  Procedure(s): LAPAROSCOPIC, POSSIBLE HAND ASSISTED RESECTION OF RECTOSIGMOID COLON, REPAIR OF COLOVAGINAL FISTULA, RIGID PROCTOSCOPY (N/A) as a surgical intervention .  The patient's history has been reviewed, patient examined, no change in status, stable for surgery.  I have reviewed the patient's chart and labs.  Questions were answered to the patient's satisfaction.    I have re-reviewed the the patient's records, history, medications, and allergies.  I have re-examined the patient.  I again discussed intraoperative plans and goals of post-operative recovery.  The patient agrees to proceed.  Lindsay Little  1965/11/13 [id number]  Patient Care Team: [name], MD as PCP - General (Family Medicine) Michael Boston, MD as Consulting Physician (General Surgery) Truitt Merle, MD as Consulting Physician (Medical Oncology) Kyung Rudd, MD as Consulting Physician (Radiation Oncology) Teena Irani, MD as Consulting Physician (Gastroenterology) Raynelle Bring, MD as Consulting Physician (Urology)  Patient Active Problem List   Diagnosis Date Noted  . Rectal adenocarcinoma s/p LAR resection 05/25/2015 01/31/2015    Priority: High  . Colovaginal fistula 07/10/2016  . Rectocutaneous fistula 07/10/2016  . Gastroenteritis 12/20/2015  . Hydroureter on right 12/20/2015  . Abdominal pain 12/19/2015  . Hypokalemia 11/23/2015  . Colorectal delayed anastomotic leak - healed, s/p ileostomy takedown 05/31/2016 11/17/2015  . Pelvic abscess s/p drainage & omental pedicle flap 11/17/2015 06/23/2015  . Anemia of chronic disease 06/14/2015  . Hypertension   . IBS (irritable bowel  syndrome)   . Depression   . Cavernous hemangioma of liver - segments 5 & 7 02/07/2015  . Osteoarthritis of left knee 02/07/2015  . Crohn's disease ([place]) 02/07/2015  . Pure hypercholesterolemia 02/21/2014  . Morbid obesity with BMI of 40.0-44.9, adult ([place]) 02/21/2014  . Family history of ischemic heart disease 02/21/2014    Past Medical History:  Diagnosis Date  . Anemia   . Anxiety   . Bulging disc    L5- not a problem today 05-30-16.  . Cancer [facility])    rectal cancer- surgery, chemo last 1 yr ago, radiation.Dr. Angelia Mould. Lisbeth Renshaw.  . Complication of anesthesia   . Cystadenoma of right ovary s/p RSO 05/25/2015 06/28/2015   resovlevd with surgery  . Depression   . Fibroids    resolved with hysterectomy.  Marland Kitchen GERD (gastroesophageal reflux disease)   . Heart murmur   . History of blood transfusion   . History of chemotherapy   . History of frequent ear infections   . History of frequent urinary tract infections    middle school age- no recent issues  . History of hiatal hernia   . History of ileostomy   . History of mitral valve prolapse   . Hypercholesteremia   . Hypertension    B/P med stopped  2 months ago.  . IBS (irritable bowel syndrome)   . Large bowel delayed anastomotic leak s/p diverting loop ileostomy 11/17/2015 11/17/2015  . Mild sleep apnea    denies -was evaluated-did not have-no cpap ever used  . Numbness and tingling    great toe bilat left more than right , remains  . Obesity   . Occasional tremors   . Osteoarthritis, knee    left shoulder and left knee  .  Pelvic abscess s/p drainage & omental pedicle flap 11/17/2015 06/23/2015  . PONV (postoperative nausea and vomiting)    severe" needs Scopolamine PATCH" post op.  . Portacath in place    right chest  . Rectal bleeding   . S/P radiation therapy 02/20/15-03/30/15   colon/rectal  . Tear of left rotator cuff    surgery done but remains with some pain intermittent.  . TMJ (dislocation of temporomandibular  joint)   . Vertigo   . Vitamin D deficiency   . Wears glasses     Past Surgical History:  Procedure Laterality Date  . ABDOMINAL HYSTERECTOMY  1996   Removed uterus, and tubes  . DIAGNOSTIC LAPAROSCOPY     11'16 right oophorectomy  . DILATION AND CURETTAGE OF UTERUS     times 2  . EUS N/A 01/18/2015   Procedure: LOWER ENDOSCOPIC ULTRASOUND (EUS);  Surgeon: Arta Silence, MD;  Location: [facility];  Service: Endoscopy;  Laterality: N/A;  . EXCISION OF SKIN TAG  11/17/2015   Procedure: EXCISION OF SKIN TAG;  Surgeon: Michael Boston, MD;  Location: [facility];  Service: General;;  . ILEO LOOP COLOSTOMY CLOSURE N/A 11/17/2015   Procedure: LAPAROSCOPIC DIVERTING LOOP ILEOSTOMY  DRAINAGE OF PELVIC ABSCESS;  Surgeon: Michael Boston, MD;  Location: [facility];  Service: General;  Laterality: N/A;  . ILEOSTOMY CLOSURE N/A 05/31/2016   Procedure: TAKEDOWN LOOP ILEOSTOMY;  Surgeon: Michael Boston, MD;  Location: [facility];  Service: General;  Laterality: N/A;  . IMPACTION REMOVAL  11/17/2015   Procedure: DISIMPACTION REMOVAL;  Surgeon: Michael Boston, MD;  Location: [facility];  Service: General;;  . JP drain     . LAPAROSCOPIC LYSIS OF ADHESIONS  11/17/2015   Procedure: LAPAROSCOPIC LYSIS OF ADHESIONS;  Surgeon: Michael Boston, MD;  Location: [facility];  Service: General;;  . left knee arthroscopy     mult  . PORTACATH PLACEMENT N/A 05/25/2015   Procedure: INSERTION PORT-A-CATH;  Surgeon: Michael Boston, MD;  Location: [facility];  Service: General;  Laterality: N/A;-remains inplace Right chest.  . ROTATOR CUFF REPAIR Left   . TONSILLECTOMY    . XI ROBOTIC ASSISTED LOWER ANTERIOR RESECTION N/A 05/25/2015   Procedure: XI ROBOTIC ASSISTED LOWER ANTERIOR RESECTION, , RIGID PROCTOSCOPY, RIGHT OOPHORECTOMY;  Surgeon: Michael Boston, MD;  Location: [facility];  Service: General;  Laterality: N/A;    Social History   Social History  . Marital status: Married    Spouse name: N/A  . Number of children: N/A  . Years of education: N/A    Occupational History  . Not on file.   Social History Main Topics  . Smoking status: Former Smoker    Packs/day: 0.50    Years: 7.00    Types: Cigarettes    Quit date: 07/16/1991  . Smokeless tobacco: Never Used     Comment: 1 pack/week intermittently x 7 years  . Alcohol use 0.6 oz/week    1 Shots of liquor per week     Comment: rarely   . Drug use: No  . Sexual activity: Yes    Birth control/ protection: Surgical   Other Topics Concern  . Not on file   Social History Narrative   Tobacco Use: Cigarettes - Former Smoker   Alcohol: Yes, very rare, liquor.    No recreational drug use   Occupation: Head CMA @ [employer]   Marital Status: Married    Husband: Roselyn Reef Disabled   Children: 2 adopted kids Maggie & Quillian Quince   Religion:  [employer] in [place]                   Family History  Problem Relation Age of Onset  . Coronary artery disease Mother 43  . Hypertension Mother   . Hyperlipidemia Mother   . Diabetes Mellitus I Mother   . Coronary artery disease Father   . Hyperlipidemia Father   . Hypertension Father   . Cancer Sister     skin - non melanoma  . Hyperlipidemia Brother   . Cancer Maternal Uncle 12    pancreatic with mets to colon and prostate  . Cirrhosis Maternal Uncle   . Hypertension Maternal Grandmother   . Diabetes Mellitus I Maternal Grandmother   . Hyperlipidemia Maternal Grandmother   . CVA Maternal Grandmother   . Hypertension Maternal Grandfather   . Coronary artery disease Maternal Grandfather 65  . Hyperlipidemia Maternal Grandfather   . Coronary artery disease Paternal Grandmother   . Hypertension Paternal Grandmother   . Hyperlipidemia Paternal Grandmother   . Diabetes Mellitus I Paternal Grandmother   . Hypertension Paternal Grandfather   . Hyperlipidemia Paternal Grandfather   . Coronary artery disease Paternal Grandfather     Current Facility-Administered Medications  Medication Dose Route Frequency Provider Last  Rate Last Dose  . 0.9 % irrigation (POUR BTL)    PRN Michael Boston, MD   1,000 mL at 07/12/16 1440  . [MAR Hold] acetaminophen (TYLENOL) tablet 1,000 mg  1,000 mg Oral TID Michael Boston, MD      . Doug Sou Hold] alum & mag hydroxide-simeth (MAALOX/MYLANTA) 200-200-20 MG/5ML suspension 30 mL  30 mL Oral Q6H PRN Michael Boston, MD      . bupivacaine liposome (EXPAREL) 1.3 % injection 266 mg  20 mL Infiltration On Call to Collins, MD      . clindamycin (CLEOCIN) 900 mg, gentamicin (GARAMYCIN) 240 mg in sodium chloride 0.9 % 1,000 mL for intraperitoneal lavage   Intraperitoneal To OR Michael Boston, MD      . clindamycin (CLEOCIN) IVPB 900 mg  900 mg Intravenous 60 min Pre-Op Michael Boston, MD       And  . gentamicin (GARAMYCIN) 470 mg in dextrose 5 % 100 mL IVPB  5 mg/kg (Adjusted) Intravenous 60 min Pre-Op Michael Boston, MD      . Doug Sou Hold] diphenhydrAMINE (BENADRYL) 12.5 MG/5ML elixir 12.5 mg  12.5 mg Oral Q6H PRN Michael Boston, MD       Or  . Doug Sou Hold] diphenhydrAMINE (BENADRYL) injection 12.5 mg  12.5 mg Intravenous Q6H PRN Michael Boston, MD      . Doug Sou Hold] DULoxetine (CYMBALTA) DR capsule 60 mg  60 mg Oral QHS Michael Boston, MD   60 mg at 07/11/16 2117  . [MAR Hold] ertapenem (INVANZ) 1 g in sodium chloride 0.9 % 50 mL IVPB  1 g Intravenous Q24H Michael Boston, MD   1 g at 07/11/16 2117  . [MAR Hold] feeding supplement (ENSURE ENLIVE) (ENSURE ENLIVE) liquid 237 mL  237 mL Oral BID BM Michael Boston, MD      . Doug Sou Hold] fluconazole (DIFLUCAN) tablet 200 mg  200 mg Oral Daily Michael Boston, MD   200 mg at 07/11/16 1735  . [MAR Hold] hydrALAZINE (APRESOLINE) injection 10 mg  10 mg Intravenous Q2H PRN Michael Boston, MD      . Doug Sou Hold] HYDROcodone-acetaminophen (NORCO) 7.5-325 MG per tablet 1-2 tablet  1-2 tablet Oral Q4H PRN Michael Boston, MD   2  tablet at 07/12/16 0847  . [MAR Hold] hyoscyamine (LEVBID) 0.375 MG 12 hr tablet 0.375 mg  0.375 mg Oral BID Michael Boston, MD   0.375 mg at 07/11/16 2117  . [MAR  Hold] lactated ringers bolus 1,000 mL  1,000 mL Intravenous Q8H PRN Michael Boston, MD      . lactated ringers infusion   Intravenous Continuous Michael Boston, MD 100 mL/hr at 07/12/16 0847    . [MAR Hold] lidocaine-prilocaine (EMLA) cream 1 application  1 application Topical Once PRN Michael Boston, MD      . Doug Sou Hold] lip balm (CARMEX) ointment 1 application  1 application Topical BID Michael Boston, MD   1 application at 79/15/05 2200  . [MAR Hold] LORazepam (ATIVAN) injection 0.5-1 mg  0.5-1 mg Intravenous Q8H PRN Michael Boston, MD      . Doug Sou Hold] magic mouthwash  15 mL Oral QID PRN Michael Boston, MD      . Doug Sou Hold] menthol-cetylpyridinium (CEPACOL) lozenge 3 mg  1 lozenge Oral PRN Michael Boston, MD      . Doug Sou Hold] methocarbamol (ROBAXIN) tablet 500-1,000 mg  500-1,000 mg Oral Q8H PRN Michael Boston, MD      . Doug Sou Hold] metoCLOPramide (REGLAN) injection 5-10 mg  5-10 mg Intravenous Q6H PRN Michael Boston, MD      . Doug Sou Hold] ondansetron (ZOFRAN-ODT) disintegrating tablet 4 mg  4 mg Oral Q6H PRN Michael Boston, MD       Or  . Doug Sou Hold] ondansetron [facility]) injection 4 mg  4 mg Intravenous Q6H PRN Michael Boston, MD   4 mg at 07/12/16 0322  . [MAR Hold] pantoprazole (PROTONIX) EC tablet 80 mg  80 mg Oral Q1200 Michael Boston, MD   80 mg at 07/11/16 1210  . [MAR Hold] phenol (CHLORASEPTIC) mouth spray 2 spray  2 spray Mouth/Throat PRN Michael Boston, MD      . Doug Sou Hold] prochlorperazine (COMPAZINE) injection 5-10 mg  5-10 mg Intravenous Q4H PRN Michael Boston, MD      . Doug Sou Hold] promethazine (PHENERGAN) tablet 25 mg  25 mg Oral Q8H PRN Michael Boston, MD   25 mg at 07/11/16 6979  . scopolamine (TRANSDERM-SCOP) 1 MG/3DAYS 1.5 mg  1 patch Transdermal Q72H Nolon Nations, MD      . Doug Sou Hold] simethicone [facility]) chewable tablet 40 mg  40 mg Oral Q6H PRN Michael Boston, MD      . Doug Sou Hold] vitamin C (ASCORBIC ACID) tablet 500 mg  500 mg Oral BID Michael Boston, MD   500 mg at 07/11/16 2117  . [MAR Hold] Vitamin D  (Ergocalciferol) (DRISDOL) capsule 50,000 Units  50,000 Units Oral Once per day on Tue Sat Michael Boston, MD       Facility-Administered Medications Ordered in Other Encounters  Medication Dose Route Frequency Provider Last Rate Last Dose  . 0.9 %  sodium chloride infusion   Intravenous Once Truitt Merle, MD         Allergies  Allergen Reactions  . Caine-1 [Lidocaine] Rash    Eyes swell shut; includes all caine drugs except marcaine. EMLA cream OK though (?!)  . Flagyl [Metronidazole] Nausea Only  . Adhesive [Tape] Other (See Comments)    Blisters - can use paper tape  . Iron Nausea Only  . Oxycodone     NIGHTMARES. (tolerates hydrocodone or tramadol better)  . Penicillins Rash    Has patient had a PCN reaction causing immediate rash, facial/tongue/throat swelling, SOB or lightheadedness with hypotension:  no Has patient had a PCN reaction causing severe rash involving mucus membranes or skin necrosis: no Has patient had a PCN reaction that required hospitalization no Has patient had a PCN reaction occurring within the last 10 years: no If all of the above answers are "NO", then may proceed with Cephalosporin use.   . Sulfa Antibiotics Rash    Rash & Vomiting    BP 135/69 (BP Location: Left Arm)   Pulse 80   Temp 98 F (36.7 C) (Oral)   Resp 20   Ht 5' 10"  (1.778 m)   Wt 133.1 kg (293 lb 6.9 oz)   SpO2 97%   BMI 42.10 kg/m   Labs: Results for orders placed or performed during the hospital encounter of 07/10/16 (from the past 48 hour(s))  Comprehensive metabolic panel     Status: Abnormal   Collection Time: 07/10/16  8:00 PM  Result Value Ref Range   Sodium 140 135 - 145 mmol/L   Potassium 3.8 3.5 - 5.1 mmol/L   Chloride 105 101 - 111 mmol/L   CO2 25 22 - 32 mmol/L   Glucose, Bld 145 (H) 65 - 99 mg/dL   BUN 13 6 - 20 mg/dL   Creatinine, Ser 0.65 0.44 - 1.00 mg/dL   Calcium 9.0 8.9 - 10.3 mg/dL   Total Protein 6.5 6.5 - 8.1 g/dL   Albumin 3.0 (L) 3.5 - 5.0 g/dL   AST 18  15 - 41 U/L   ALT 12 (L) 14 - 54 U/L   Alkaline Phosphatase 66 38 - 126 U/L   Total Bilirubin 0.3 0.3 - 1.2 mg/dL   GFR calc non Af Amer >60 >60 mL/min   GFR calc Af Amer >60 >60 mL/min    Comment: (NOTE) The eGFR has been calculated using the CKD EPI equation. This calculation has not been validated in all clinical situations. eGFR's persistently <60 mL/min signify possible Chronic Kidney Disease.    Anion gap 10 5 - 15  CBC WITH DIFFERENTIAL     Status: Abnormal   Collection Time: 07/10/16  8:00 PM  Result Value Ref Range   WBC 7.9 4.0 - 10.5 K/uL   RBC 3.55 (L) 3.87 - 5.11 MIL/uL   Hemoglobin 9.4 (L) 12.0 - 15.0 g/dL   HCT 29.9 (L) 36.0 - 46.0 %   MCV 84.2 78.0 - 100.0 fL   MCH 26.5 26.0 - 34.0 pg   MCHC 31.4 30.0 - 36.0 g/dL   RDW 13.8 11.5 - 15.5 %   Platelets 399 150 - 400 K/uL   Neutrophils Relative % 77 %   Neutro Abs 6.1 1.7 - 7.7 K/uL   Lymphocytes Relative 14 %   Lymphs Abs 1.1 0.7 - 4.0 K/uL   Monocytes Relative 5 %   Monocytes Absolute 0.4 0.1 - 1.0 K/uL   Eosinophils Relative 4 %   Eosinophils Absolute 0.3 0.0 - 0.7 K/uL   Basophils Relative 0 %   Basophils Absolute 0.0 0.0 - 0.1 K/uL    Imaging / Studies: Ct Abdomen Pelvis W Contrast  Addendum Date: 07/04/2016   ADDENDUM REPORT: 07/04/2016 18:48 ADDENDUM: Discussed additional history with Dr. Johney Maine: Patient had a prior leak which was passed with omentum. Postoperative contrast enema showed no contrast extravasation. An enema tip was inserted into the rectum and a small amount of water-soluble contrast was instilled retrograde. Repeat CT imaging of the pelvis was then performed. Rectal contrast outlines the stool in the sigmoid colon and rectum, including  the large collection of stool in the presacral space. Multiplanar images show that the stool is present within a large patulous rectosigmoid colon without definite contrast extravasation. This does not appear to be a separate contiguous collection but a  distended rectosigmoid segment. Also identified is presence of contrast material within the vagina compatible rectovaginal fistula, question arising at the RIGHT superolateral aspect of the vaginal cuff. Additionally, a linear collection of contrast material is seen extending posteriorly from the rectum RIGHT of the sacrum, extending into the medial aspect of the RIGHT gluteus maximus, consistent with an additional fistulous track. Infiltrative changes extend dorsally from the site on axial images into the subcutaneous fat. Electronically Signed   By: Lavonia Dana M.D.   On: 07/04/2016 18:48   Result Date: 07/04/2016 CLINICAL DATA:  Colon cancer post resection, chemotherapy and radiation therapy, possible fistula EXAM: CT ABDOMEN AND PELVIS WITH CONTRAST TECHNIQUE: Multidetector CT imaging of the abdomen and pelvis was performed using the standard protocol following bolus administration of intravenous contrast. CONTRAST:  81m ISOVUE-300 IOPAMIDOL (ISOVUE-300) INJECTION 61% PO, 1061mISOVUE-300 IOPAMIDOL (ISOVUE-300) INJECTION 61% IV COMPARISON:  02/21/2016 FINDINGS: Lower chest: Lung bases clear Hepatobiliary: Poorly defined area of low-attenuation RIGHT lobe of liver centrally image 17 measuring 2.1 x 1.8 cm. Additional area of poorly defined low-attenuation lateral aspect RIGHT lobe of liver more inferiorly, 2.3 x 1.6 cm image 29. These lesions have not significantly changed in size since the previous exam. Pancreas: Normal appearance Spleen: Normal appearance Adrenals/Urinary Tract: Adrenal glands normal appearance. Kidneys and ureters normal appearance. RIGHT hydronephrosis and hydroureter seen on previous exam resolved. Bladder wall appears mildly thickened but bladder is underdistended question artifact. Stomach/Bowel: Stomach normal appearance. Small bowel loops nondistended without wall thickening. Increased stool throughout colon. Interval takedown of ostomy in RIGHT upper quadrant question loop  ileostomy. Staple line at enterotomy closure in the RIGHT mid abdomen. Large subcutaneous fluid collection at site of ostomy take-down, 9.3 x 4.3 x 5.3 cm, may represent a sterile or infected collection. Mild overlying skin thickening and minimal surrounding infiltrative changes. Rectum distended by stool with mild thickening of the wall with adjacent infiltration of presacral and perirectal fat, question related to radiation therapy and prior surgery. A large collection of stool and gas is seen within the presacral space. This appears to be predominantly cranial to the anastomotic suture line more inferiorly. This roughly occupies the same location as the gas and fluid collection identified on the prior CT. While this could represent stool within a distended rectum, the fact that this courses cranially in the same distribution as the previous abscess collection suggests this may be stool extraluminal to the rectosigmoid colon within a contiguous contained collection. This overall measures approximately 8 cm transverse by 7 cm AP and extends 9 cm length. Vascular/Lymphatic: Multiple normal sized para-aortic and LEFT common iliac nodes. No additional abdominal or pelvic adenopathy. Aorta normal caliber. 7 mm LEFT external iliac node image 86 unchanged. Multiple small nodules in the fat adjacent to the rectosigmoid colon axial images 73, 74, 78 question tiny lymph nodes, largest 8 mm short axis coronal image 62, minimally increased. Reproductive: Post hysterectomy. RIGHT ovary not visualized. Suspect visualized LEFT ovary. Air within vagina. Other: No free air or free fluid.  No hernia. Musculoskeletal: Osseous structures unremarkable. IMPRESSION: Subcutaneous fluid collection at the site of ileostomy takedown, could be sterile or infected, measuring 9.3 x 4.3 x 5.3 cm; this could be aspirated under ultrasound guidance if clinically indicated. Large collection of  stool within the presacral space, could potentially be  within a distended distorted rectum but since this occupies the same position as the abscess collection identified on the previous CT this is suspicious for a contiguous contained extraluminal stool collection communicating with the rectosigmoid colon, collection overall roughly 7 x 8 x 9 cm. Electronically Signed: By: Lavonia Dana M.D. On: 07/04/2016 16:58     .[name], M.D., F.A.C.S. Gastrointestinal and Minimally Invasive Surgery [facility] [address], [place], [place] [address] [phone] Main / Paging  07/12/2016 2:41 PM    Brigett Estell C.

## 2016-07-13 LAB — BASIC METABOLIC PANEL
ANION GAP: 8 (ref 5–15)
BUN: 6 mg/dL (ref 6–20)
CALCIUM: 8.9 mg/dL (ref 8.9–10.3)
CO2: 26 mmol/L (ref 22–32)
Chloride: 102 mmol/L (ref 101–111)
Creatinine, Ser: 0.5 mg/dL (ref 0.44–1.00)
GFR calc Af Amer: >60 mL/min (ref >60)
GLUCOSE: 145 mg/dL — AB (ref 65–99)
Potassium: 4 mmol/L (ref 3.5–5.1)
Sodium: 136 mmol/L (ref 135–145)

## 2016-07-13 LAB — CBC
HEMATOCRIT: 31.4 % — AB (ref 36.0–46.0)
Hemoglobin: 10.2 g/dL — ABNORMAL LOW (ref 12.0–15.0)
MCH: 27.9 pg (ref 26.0–34.0)
MCHC: 32.5 g/dL (ref 30.0–36.0)
MCV: 85.8 fL (ref 78.0–100.0)
PLATELETS: 518 10*3/uL — AB (ref 150–400)
RBC: 3.66 MIL/uL — ABNORMAL LOW (ref 3.87–5.11)
RDW: 14.2 % (ref 11.5–15.5)
WBC: 16.2 10*3/uL — ABNORMAL HIGH (ref 4.0–10.5)

## 2016-07-13 LAB — MAGNESIUM: Magnesium: 1.9 mg/dL (ref 1.7–2.4)

## 2016-07-13 MED ORDER — KCL IN DEXTROSE-NACL 20-5-0.45 MEQ/L-%-% IV SOLN
INTRAVENOUS | Status: DC
  Administered 2016-07-13 – 2016-07-14 (×3): via INTRAVENOUS
  Filled 2016-07-13 (×4): qty 1000

## 2016-07-13 MED ORDER — SODIUM CHLORIDE 0.9% FLUSH: 10.0000 mL | INTRAVENOUS | Status: DC | PRN

## 2016-07-13 NOTE — Progress Notes (Signed)
1 Day Post-Op  Subjective: Has pain this am, controlled when gets meds, in bed  Objective: Vital signs in last 24 hours: Temp:  [97.5 F (36.4 C)-98.1 F (36.7 C)] 97.7 F (36.5 C) (12/30 0445) Pulse Rate:  [70-88] 81 (12/30 0445) Resp:  [15-20] 16 (12/30 0445) BP: (120-139)/(58-84) 133/81 (12/30 0445) SpO2:  [92 %-100 %] 92 % (12/30 0445) Last BM Date: 07/12/16 (colostomy)  Intake/Output from previous day: 12/29 0701 - 12/30 0700 In: 3195 [P.O.:120; I.V.:3075] Out: 2360 [Urine:1560; Drains:375; Blood:200] Intake/Output this shift: No intake/output data recorded.  GI: soft, incisions clean stoma pink not functional yet, jp serosang  Lab Results:   Recent Labs  07/10/16 2000 07/12/16 1917 07/13/16 0500  WBC 7.9  --  16.2*  HGB 9.4* 10.9* 10.2*  HCT 29.9* 32.0* 31.4*  PLT 399  --  518*   BMET  Recent Labs  07/10/16 2000 07/12/16 1917 07/13/16 0500  NA 140 140 136  K 3.8 4.1 4.0  CL 105  --  102  CO2 25  --  26  GLUCOSE 145*  --  145*  BUN 13  --  6  CREATININE 0.65  --  0.50  CALCIUM 9.0  --  8.9   PT/INR No results for input(s): LABPROT, INR in the last 72 hours. ABG  Recent Labs  07/12/16 1917  HCO3 27.0    Studies/Results: No results found.  Anti-infectives: Anti-infectives    Start     Dose/Rate Route Frequency Ordered Stop   07/13/16 2200  ertapenem (INVANZ) 1 g in sodium chloride 0.9 % 50 mL IVPB     1 g 100 mL/hr over 30 Minutes Intravenous Every 24 hours 07/12/16 2308 07/16/16 2159   07/13/16 1800  fluconazole (DIFLUCAN) tablet 400 mg     400 mg Oral Daily 07/12/16 2308 07/16/16 1759   07/12/16 1000  clindamycin (CLEOCIN) IVPB 900 mg     900 mg 100 mL/hr over 30 Minutes Intravenous 60 min pre-op 07/10/16 1841 07/12/16 1553   07/12/16 1000  gentamicin (GARAMYCIN) 470 mg in dextrose 5 % 100 mL IVPB     5 mg/kg  94.3 kg (Adjusted) 111.8 mL/hr over 60 Minutes Intravenous 60 min pre-op 07/10/16 1841 07/12/16 1540   07/12/16 1000   clindamycin (CLEOCIN) 900 mg, gentamicin (GARAMYCIN) 240 mg in sodium chloride 0.9 % 1,000 mL for intraperitoneal lavage  Status:  Discontinued      Intraperitoneal To Surgery 07/10/16 1841 07/12/16 2029   07/11/16 1400  neomycin (MYCIFRADIN) tablet 1,000 mg     1,000 mg Oral 3 times per day 07/10/16 1844 07/11/16 2117   07/10/16 2015  ertapenem (INVANZ) 1 g in sodium chloride 0.9 % 50 mL IVPB  Status:  Discontinued     1 g 100 mL/hr over 30 Minutes Intravenous Every 24 hours 07/10/16 1848 07/12/16 2308   07/10/16 1900  fluconazole (DIFLUCAN) tablet 200 mg  Status:  Discontinued     200 mg Oral Daily 07/10/16 1848 07/12/16 2308   07/10/16 1841  neomycin (MYCIFRADIN) tablet 1,000 mg  Status:  Discontinued     1,000 mg Oral 3 times per day 07/10/16 1841 07/10/16 1844   07/10/16 1841  metroNIDAZOLE (FLAGYL) tablet 1,000 mg  Status:  Discontinued     1,000 mg Oral 3 times per day 07/10/16 1841 07/10/16 1844      Assessment/Plan: POD 1 lap end colostomy, oversew rectal stump 1. Continue iv pain meds, will add pca if not enough 2. pulm  toilet, needs oob today, ics and ambulate 3. Leave on clears today until ileus resolves 4. Dc foley tomorrow, continue iv fluids 5. Lovenox, scds  [facility] 07/13/2016

## 2016-07-14 LAB — BASIC METABOLIC PANEL
ANION GAP: 6 (ref 5–15)
BUN: 7 mg/dL (ref 6–20)
CALCIUM: 8.3 mg/dL — AB (ref 8.9–10.3)
CHLORIDE: 105 mmol/L (ref 101–111)
CO2: 26 mmol/L (ref 22–32)
Creatinine, Ser: 0.63 mg/dL (ref 0.44–1.00)
GFR calc Af Amer: >60 mL/min (ref >60)
GFR calc non Af Amer: >60 mL/min (ref >60)
GLUCOSE: 351 mg/dL — AB (ref 65–99)
Potassium: 5.2 mmol/L — ABNORMAL HIGH (ref 3.5–5.1)
Sodium: 137 mmol/L (ref 135–145)

## 2016-07-14 MED ORDER — SODIUM CHLORIDE 0.9 % IV SOLN
INTRAVENOUS | Status: DC
  Administered 2016-07-14 – 2016-07-16 (×4): via INTRAVENOUS

## 2016-07-14 MED ORDER — HYDROMORPHONE HCL 1 MG/ML IJ SOLN
0.5000 mg | INTRAMUSCULAR | Status: DC | PRN
  Administered 2016-07-14 (×3): 2 mg via INTRAVENOUS
  Administered 2016-07-14: 1 mg via INTRAVENOUS
  Administered 2016-07-14 – 2016-07-16 (×5): 2 mg via INTRAVENOUS
  Filled 2016-07-14 (×2): qty 2
  Filled 2016-07-14: qty 1
  Filled 2016-07-14 (×6): qty 2

## 2016-07-14 NOTE — Consult Note (Signed)
[facility] Nurse ostomy consult note Stoma type/location: RUQ colostomy (permanent) Stomal assessment/size: 1 and 3/4 inch round, edematous, raised, red. Os in center Peristomal assessment: Intact, clear.  Mild depression at 8 o'clock. Treatment options for stomal/peristomal skin: skin barrier ring Output: serosanguinous  Ostomy pouching: 2pc 2 and 3/4 inch pouching system today.  Expect she will eventually end up in a 2 and 1/4 inch system..  Education provided: Patient and husband provided with an educational booklet on colostomies.  We discuss today how a colostomy differs from an ileostomy, primarily around dietary concerns pertaining to blockage and fluid risks of dehydration. Patient is taught A&P of GI system and why stool consistency will likely be different (more pasty) and predictable. Patient is experiencing gas pains today and is taking antigas medication in addition to pain meds and ambulating in the hallways. She began a soft diet today. Enrolled patient in [place] program: Yes, already a patient with that service.  They will need to be notified that she has new supply needs, but are closed for the holiday.  I will notify on 07/16/16. [facility] nursing team will follow, and will remain available to this patient, the nursing, surgical and medical teams. Will plan to see next on Tuesday. Thanks, Maudie Flakes, MSN, RN, [place], Arther Abbott  Pager# [phone]

## 2016-07-14 NOTE — Progress Notes (Signed)
2 Days Post-Op  Subjective: Feels much better today, up yesterday, no n/v tol clears wants more  Objective: Vital signs in last 24 hours: Temp:  [97.5 F (36.4 C)-98.2 F (36.8 C)] 97.7 F (36.5 C) (12/31 0531) Pulse Rate:  [53-87] 55 (12/31 0531) Resp:  [16-20] 16 (12/31 0531) BP: (102-130)/(50-74) 114/51 (12/31 0531) SpO2:  [94 %-100 %] 100 % (12/31 0531) Last BM Date: 07/12/16  Intake/Output from previous day: 12/30 0701 - 12/31 0700 In: 2090 [P.O.:840; I.V.:1200; IV Piggyback:50] Out: 3591.5 [Urine:3500; Drains:80; Stool:11.5] Intake/Output this shift: No intake/output data recorded.  Resp: clear to auscultation bilaterally Cardio: regular rate and rhythm GI: soft approp tender drain serosang ostomy with not much output pink  Lab Results:   Recent Labs  07/12/16 1917 07/13/16 0500  WBC  --  16.2*  HGB 10.9* 10.2*  HCT 32.0* 31.4*  PLT  --  518*   BMET  Recent Labs  07/13/16 0500 07/14/16 0455  NA 136 137  K 4.0 5.2*  CL 102 105  CO2 26 26  GLUCOSE 145* 351*  BUN 6 7  CREATININE 0.50 0.63  CALCIUM 8.9 8.3*   PT/INR No results for input(s): LABPROT, INR in the last 72 hours. ABG  Recent Labs  07/12/16 1917  HCO3 27.0    Studies/Results: No results found.  Anti-infectives: Anti-infectives    Start     Dose/Rate Route Frequency Ordered Stop   07/13/16 2200  ertapenem (INVANZ) 1 g in sodium chloride 0.9 % 50 mL IVPB     1 g 100 mL/hr over 30 Minutes Intravenous Every 24 hours 07/12/16 2308 07/16/16 2159   07/13/16 1800  fluconazole (DIFLUCAN) tablet 400 mg     400 mg Oral Daily 07/12/16 2308 07/16/16 1759   07/12/16 1000  clindamycin (CLEOCIN) IVPB 900 mg     900 mg 100 mL/hr over 30 Minutes Intravenous 60 min pre-op 07/10/16 1841 07/12/16 1553   07/12/16 1000  gentamicin (GARAMYCIN) 470 mg in dextrose 5 % 100 mL IVPB     5 mg/kg  94.3 kg (Adjusted) 111.8 mL/hr over 60 Minutes Intravenous 60 min pre-op 07/10/16 1841 07/12/16 1540   07/12/16 1000  clindamycin (CLEOCIN) 900 mg, gentamicin (GARAMYCIN) 240 mg in sodium chloride 0.9 % 1,000 mL for intraperitoneal lavage  Status:  Discontinued      Intraperitoneal To Surgery 07/10/16 1841 07/12/16 2029   07/11/16 1400  neomycin (MYCIFRADIN) tablet 1,000 mg     1,000 mg Oral 3 times per day 07/10/16 1844 07/11/16 2117   07/10/16 2015  ertapenem (INVANZ) 1 g in sodium chloride 0.9 % 50 mL IVPB  Status:  Discontinued     1 g 100 mL/hr over 30 Minutes Intravenous Every 24 hours 07/10/16 1848 07/12/16 2308   07/10/16 1900  fluconazole (DIFLUCAN) tablet 200 mg  Status:  Discontinued     200 mg Oral Daily 07/10/16 1848 07/12/16 2308   07/10/16 1841  neomycin (MYCIFRADIN) tablet 1,000 mg  Status:  Discontinued     1,000 mg Oral 3 times per day 07/10/16 1841 07/10/16 1844   07/10/16 1841  metroNIDAZOLE (FLAGYL) tablet 1,000 mg  Status:  Discontinued     1,000 mg Oral 3 times per day 07/10/16 1841 07/10/16 1844      Assessment/Plan: POD 2 lap end colostomy 1. Continue iv pain meds with orals, pain better controlled today 2. pulm toilet, needs oob today, ics and ambulate 3. Will advance to full liquids today 4. Dc foley, k  a little high, will remove from fluids, recheck in am 5. Lovenox, scds  [facility] 07/14/2016

## 2016-07-15 LAB — BASIC METABOLIC PANEL
ANION GAP: 8 (ref 5–15)
BUN: 9 mg/dL (ref 6–20)
CO2: 27 mmol/L (ref 22–32)
CREATININE: 0.67 mg/dL (ref 0.44–1.00)
Calcium: 8.6 mg/dL — ABNORMAL LOW (ref 8.9–10.3)
Chloride: 104 mmol/L (ref 101–111)
GLUCOSE: 81 mg/dL (ref 65–99)
Potassium: 3.8 mmol/L (ref 3.5–5.1)
Sodium: 139 mmol/L (ref 135–145)

## 2016-07-15 NOTE — Progress Notes (Signed)
3 Days Post-Op  Subjective: Tired, "gas pains", ambulating has been up in chair  Objective: Vital signs in last 24 hours: Temp:  [97.5 F (36.4 C)-97.8 F (36.6 C)] 97.6 F (36.4 C) (01/01 0542) Pulse Rate:  [53-64] 53 (01/01 0542) Resp:  [16-18] 18 (01/01 0542) BP: (94-122)/(59-67) 122/67 (01/01 0542) SpO2:  [98 %-99 %] 98 % (01/01 0542) Last BM Date: 07/12/16  Intake/Output from previous day: 12/31 0701 - 01/01 0700 In: 3020 [P.O.:1560; I.V.:1460] Out: 270 [Urine:200; Drains:70] Intake/Output this shift: No intake/output data recorded.  GI: soft approp tender stoma pink and no output, incisions with dressings clean  Lab Results:   Recent Labs  07/12/16 1917 07/13/16 0500  WBC  --  16.2*  HGB 10.9* 10.2*  HCT 32.0* 31.4*  PLT  --  518*   BMET  Recent Labs  07/14/16 0455 07/15/16 0608  NA 137 139  K 5.2* 3.8  CL 105 104  CO2 26 27  GLUCOSE 351* 81  BUN 7 9  CREATININE 0.63 0.67  CALCIUM 8.3* 8.6*   PT/INR No results for input(s): LABPROT, INR in the last 72 hours. ABG  Recent Labs  07/12/16 1917  HCO3 27.0    Studies/Results: No results found.  Anti-infectives: Anti-infectives    Start     Dose/Rate Route Frequency Ordered Stop   07/13/16 2200  ertapenem (INVANZ) 1 g in sodium chloride 0.9 % 50 mL IVPB     1 g 100 mL/hr over 30 Minutes Intravenous Every 24 hours 07/12/16 2308 07/16/16 2159   07/13/16 1800  fluconazole (DIFLUCAN) tablet 400 mg     400 mg Oral Daily 07/12/16 2308 07/16/16 1759   07/12/16 1000  clindamycin (CLEOCIN) IVPB 900 mg     900 mg 100 mL/hr over 30 Minutes Intravenous 60 min pre-op 07/10/16 1841 07/12/16 1553   07/12/16 1000  gentamicin (GARAMYCIN) 470 mg in dextrose 5 % 100 mL IVPB     5 mg/kg  94.3 kg (Adjusted) 111.8 mL/hr over 60 Minutes Intravenous 60 min pre-op 07/10/16 1841 07/12/16 1540   07/12/16 1000  clindamycin (CLEOCIN) 900 mg, gentamicin (GARAMYCIN) 240 mg in sodium chloride 0.9 % 1,000 mL for  intraperitoneal lavage  Status:  Discontinued      Intraperitoneal To Surgery 07/10/16 1841 07/12/16 2029   07/11/16 1400  neomycin (MYCIFRADIN) tablet 1,000 mg     1,000 mg Oral 3 times per day 07/10/16 1844 07/11/16 2117   07/10/16 2015  ertapenem (INVANZ) 1 g in sodium chloride 0.9 % 50 mL IVPB  Status:  Discontinued     1 g 100 mL/hr over 30 Minutes Intravenous Every 24 hours 07/10/16 1848 07/12/16 2308   07/10/16 1900  fluconazole (DIFLUCAN) tablet 200 mg  Status:  Discontinued     200 mg Oral Daily 07/10/16 1848 07/12/16 2308   07/10/16 1841  neomycin (MYCIFRADIN) tablet 1,000 mg  Status:  Discontinued     1,000 mg Oral 3 times per day 07/10/16 1841 07/10/16 1844   07/10/16 1841  metroNIDAZOLE (FLAGYL) tablet 1,000 mg  Status:  Discontinued     1,000 mg Oral 3 times per day 07/10/16 1841 07/10/16 1844      Assessment/Plan:  POD 3 lap end colostomy 1. Continue iv pain meds with orals 2. pulm toilet,  ics and ambulate 3. Will keep at fulls today, she has ileus that still needs to resolve 4.potassium normal again today, continue iv fluids 5. abx for 3 days postop per Dr Johney Maine  6. Cont dr changes 7. Lovenox, scds [facility] 07/15/2016

## 2016-07-15 NOTE — Progress Notes (Signed)
Attempted dressing change, patient refused stating she wanted the pain meds to kick in, then walk, and then shower and change it then.

## 2016-07-16 ENCOUNTER — Encounter (HOSPITAL_COMMUNITY): Payer: Self-pay | Admitting: Surgery

## 2016-07-16 MED ORDER — ADULT MULTIVITAMIN W/MINERALS CH
1.0000 | ORAL_TABLET | Freq: Every day | ORAL | Status: DC
  Administered 2016-07-16 – 2016-07-17 (×2): 1 via ORAL
  Filled 2016-07-16 (×2): qty 1

## 2016-07-16 MED ORDER — HYDROMORPHONE HCL 1 MG/ML IJ SOLN: 1.0000 mg | INTRAMUSCULAR | Status: DC | PRN

## 2016-07-16 MED ORDER — HYOSCYAMINE SULFATE ER 0.375 MG PO TB12
0.3750 mg | ORAL_TABLET | Freq: Two times a day (BID) | ORAL | Status: DC | PRN
  Filled 2016-07-16: qty 1

## 2016-07-16 NOTE — Progress Notes (Signed)
Lindsay Little  [address] [place], [place] [address] Phone: [phone] FAX: [phone]   Lindsay Little [id number] Sep 25, 1965    Problem List:   Principal Problem:   Colorectal delayed anastomotic leak s/p resection & colostomy 07/12/2016 Active Problems:   Rectal adenocarcinoma s/p LAR resection 05/25/2015   Morbid obesity with BMI of 40.0-44.9, adult ([place])   Colovaginal fistula s/p omentopexy repair 07/12/2016   Rectocutaneous fistula   Colostomy in place [facility])   4 Days Post-Op       Assessment  Improving slowly  Plan:  -adv to solids -colostomy care -continue pelvic drain x2-3 weeks to help pelvis close down -pack right gluteal wound until closes -bowel regimen -IV ABx finished - follow -anxiolysis -control fibromyalgia -VTE prophylaxis- SCDs, etc -mobilize as tolerated to help recovery  D/C patient from hospital when patient meets criteria (anticipate in 1-2 day(s)):  Tolerating oral intake well Ambulating well Adequate pain control without IV medications Urinating  Having flatus Disposition planning in place  I updated the patient's status to the patient and spouse.  Recommendations were made.  Questions were answered.  They expressed understanding & appreciation.   [name], M.D., F.A.C.S. Gastrointestinal and Minimally Invasive Surgery [facility] [address], [place], [place] [address] [phone] Main / Paging   07/16/2016  CARE TEAM:  PCP: Vidal Schwalbe, MD  Outpatient Care Team: Patient Care Team: [name], MD as PCP - General (Family Medicine) Michael Boston, MD as Consulting Physician (General Surgery) Truitt Merle, MD as Consulting Physician (Medical Oncology) Kyung Rudd, MD as Consulting Physician (Radiation Oncology) Teena Irani, MD as Consulting Physician (Gastroenterology) Raynelle Bring, MD as Consulting Physician  (Urology)  Inpatient Treatment Team: Treatment Team: Attending Provider: Michael Boston, MD; Technician: Abbe Amsterdam, NT; Registered Nurse: Nila Nephew, RN; Technician: Sueanne Margarita, NT; Technician: Merian Capron, NT; Registered Nurse: Mortimer Fries, RN  Subjective:  Pain less Tol liquids - wants to try solids Husband in room. Walking in the hallways many times.  Objective:  Vital signs:  Vitals:   07/15/16 0542 07/15/16 1337 07/15/16 2215 07/16/16 0623  BP: 122/67 128/61 113/67 118/61  Pulse: (!) 53 67 (!) 58 65  Resp: 18 16 16 16   Temp: 97.6 F (36.4 C) 97.8 F (36.6 C) 97.7 F (36.5 C) 98.2 F (36.8 C)  TempSrc: Oral Oral Oral Oral  SpO2: 98% 98% 98% 97%  Weight:      Height:        Last BM Date: 07/12/16  Intake/Output   Yesterday:  01/01 0701 - 01/02 0700 In: 2220 [P.O.:720; I.V.:1500] Out: 2179 [Urine:2050; Drains:119; Stool:10] This shift:  No intake/output data recorded.  Bowel function:  Flatus: YES  BM:  YES  Drain: Serosanguinous thin   Physical Exam:  General: Pt awake/alert/oriented x4 in mild acute distress Eyes: PERRL, normal EOM.  Sclera clear.  No icterus Neuro: CN II-XII intact w/o focal sensory/motor deficits. Lymph: No head/neck/groin lymphadenopathy Psych:  No delerium/psychosis/paranoia.  Tearful but consolable HENT: Normocephalic, Mucus membranes moist.  No thrush Neck: Supple, No tracheal deviation Chest: No chest wall pain w good excursion CV:  Pulses intact.  Regular rhythm MS: Normal AROM mjr joints.  No obvious deformity Abdomen: Soft.  Nondistended.  Nontender.  No evidence of peritonitis.  No incarcerated hernias. GU - feculent drainage Rectal: Some liquid diarrhea.  Wick in place with less erythema Ext:  SCDs BLE.  No mjr  edema.  No cyanosis Skin: No petechiae / purpura  Results:   Labs: Results for orders placed or performed during the hospital encounter of 07/10/16 (from the past 48 hour(s))  Basic  metabolic panel     Status: Abnormal   Collection Time: 07/15/16  6:08 AM  Result Value Ref Range   Sodium 139 135 - 145 mmol/L   Potassium 3.8 3.5 - 5.1 mmol/L    Comment: DELTA CHECK NOTED   Chloride 104 101 - 111 mmol/L   CO2 27 22 - 32 mmol/L   Glucose, Bld 81 65 - 99 mg/dL   BUN 9 6 - 20 mg/dL   Creatinine, Ser 0.67 0.44 - 1.00 mg/dL   Calcium 8.6 (L) 8.9 - 10.3 mg/dL   GFR calc non Af Amer >60 >60 mL/min   GFR calc Af Amer >60 >60 mL/min    Comment: (NOTE) The eGFR has been calculated using the CKD EPI equation. This calculation has not been validated in all clinical situations. eGFR's persistently <60 mL/min signify possible Chronic Kidney Disease.    Anion gap 8 5 - 15    Imaging / Studies: No results found.  Medications / Allergies: per chart  Antibiotics: Anti-infectives    Start     Dose/Rate Route Frequency Ordered Stop   07/13/16 2200  ertapenem (INVANZ) 1 g in sodium chloride 0.9 % 50 mL IVPB     1 g 100 mL/hr over 30 Minutes Intravenous Every 24 hours 07/12/16 2308 07/15/16 2229   07/13/16 1800  fluconazole (DIFLUCAN) tablet 400 mg     400 mg Oral Daily 07/12/16 2308 07/15/16 1805   07/12/16 1000  clindamycin (CLEOCIN) IVPB 900 mg     900 mg 100 mL/hr over 30 Minutes Intravenous 60 min pre-op 07/10/16 1841 07/12/16 1553   07/12/16 1000  gentamicin (GARAMYCIN) 470 mg in dextrose 5 % 100 mL IVPB     5 mg/kg  94.3 kg (Adjusted) 111.8 mL/hr over 60 Minutes Intravenous 60 min pre-op 07/10/16 1841 07/12/16 1540   07/12/16 1000  clindamycin (CLEOCIN) 900 mg, gentamicin (GARAMYCIN) 240 mg in sodium chloride 0.9 % 1,000 mL for intraperitoneal lavage  Status:  Discontinued      Intraperitoneal To Surgery 07/10/16 1841 07/12/16 2029   07/11/16 1400  neomycin (MYCIFRADIN) tablet 1,000 mg     1,000 mg Oral 3 times per day 07/10/16 1844 07/11/16 2117   07/10/16 2015  ertapenem (INVANZ) 1 g in sodium chloride 0.9 % 50 mL IVPB  Status:  Discontinued     1 g 100 mL/hr  over 30 Minutes Intravenous Every 24 hours 07/10/16 1848 07/12/16 2308   07/10/16 1900  fluconazole (DIFLUCAN) tablet 200 mg  Status:  Discontinued     200 mg Oral Daily 07/10/16 1848 07/12/16 2308   07/10/16 1841  neomycin (MYCIFRADIN) tablet 1,000 mg  Status:  Discontinued     1,000 mg Oral 3 times per day 07/10/16 1841 07/10/16 1844   07/10/16 1841  metroNIDAZOLE (FLAGYL) tablet 1,000 mg  Status:  Discontinued     1,000 mg Oral 3 times per day 07/10/16 1841 07/10/16 1844        Note: Portions of this report may have been transcribed using voice recognition software. Every effort was made to ensure accuracy; however, inadvertent computerized transcription errors may be present.   Any transcriptional errors that result from this process are unintentional.     [name], M.D., F.A.C.S. Gastrointestinal and Minimally Invasive Surgery [facility] [address] [place], [place] [address] [phone] Main / Paging   07/16/2016

## 2016-07-17 LAB — CREATININE, SERUM: CREATININE: 0.65 mg/dL (ref 0.44–1.00)

## 2016-07-17 LAB — POTASSIUM: Potassium: 3.6 mmol/L (ref 3.5–5.1)

## 2016-07-17 LAB — MAGNESIUM: Magnesium: 2 mg/dL (ref 1.7–2.4)

## 2016-07-17 MED ORDER — HYDROCODONE-ACETAMINOPHEN 7.5-325 MG PO TABS: 1.0000 | ORAL_TABLET | ORAL | 0 refills | Status: DC | PRN

## 2016-07-17 MED ORDER — HYOSCYAMINE SULFATE ER 0.375 MG PO TB12
0.3750 mg | ORAL_TABLET | Freq: Two times a day (BID) | ORAL | Status: DC
  Administered 2016-07-17: 0.375 mg via ORAL
  Filled 2016-07-17: qty 1

## 2016-07-17 MED ORDER — HEPARIN SOD (PORK) LOCK FLUSH 100 UNIT/ML IV SOLN
500.0000 [IU] | INTRAVENOUS | Status: AC | PRN
  Administered 2016-07-17: 500 [IU]

## 2016-07-17 NOTE — Progress Notes (Signed)
Pharmacy Brief Note - Alvimopan (Entereg)  The standing order set for alvimopan (Entereg) now includes an automatic order to discontinue the drug after the patient has had a bowel movement. The change was approved by the [place] and the Medical Executive Committee.  This patient has had a bowel movement documented by nursing. Therefore, alvimopan has been discontinued. If there are questions, please contact the pharmacy at [phone].  Thank you   Reuel Boom, PharmD, BCPS Pager: [phone] 07/17/2016, 1:04 PM

## 2016-07-17 NOTE — Consult Note (Signed)
[facility] Nurse ostomy follow up Stoma type/location: LLQ colostomy Stomal assessment/size: 1 and 3/4 inch round, budded, red, moist, edematous Peristomal assessment: intact, clear Treatment options for stomal/peristomal skin: skin barrier ring Output: brown liquid stool  Ostomy pouching: 2pc. 2 and 3/4 inch supplies (4) sent home with patient, also 3 2 and 1/4 inch supply for when ostomy shrinks.  Husband and wife have flat affect this morning as they were not anticipating discharge. Education provided: Patient support provided. Demonstrated the back of the pouching system still had intact pectin barrier, indicating that a twice weekly change schedule should be sufficient (3 days, then 4 days).  Enrolled patient in [place] Start Discharge program: Yes [facility] nursing team will not follow post, but will remain available to this patient, the nursing, surgical and medical teams.   Thanks, Maudie Flakes, MSN, RN, [place], Arther Abbott  Pager# [phone]

## 2016-07-17 NOTE — Progress Notes (Signed)
Pt and husband were given discharge instructions and prescriptions. Pt had no questions. Patient was taken to main entrance via wheelchair. [place]

## 2016-07-17 NOTE — Discharge Summary (Signed)
Physician Discharge Summary  Patient ID: KIYONA MCNALL MRN: [id number] DOB/AGE: [id number]  51 y.o.  Admit date: 07/10/2016 Discharge date:   Patient Care Team: [name], MD as PCP - General (Family Medicine) Michael Boston, MD as Consulting Physician (General Surgery) Truitt Merle, MD as Consulting Physician (Medical Oncology) Kyung Rudd, MD as Consulting Physician (Radiation Oncology) Teena Irani, MD as Consulting Physician (Gastroenterology) Raynelle Bring, MD as Consulting Physician (Urology)  Discharge Diagnoses:  Principal Problem:   Colorectal delayed anastomotic leak s/p resection & colostomy 07/12/2016 Active Problems:   Rectal adenocarcinoma s/p LAR resection 05/25/2015   Morbid obesity with BMI of 40.0-44.9, adult ([place])   Colovaginal fistula s/p omentopexy repair 07/12/2016   Rectocutaneous fistula   Colostomy in place ([place])   POST-OPERATIVE DIAGNOSIS:  COLORECTAL ANASTOMOTIC BREAKDOWN WITH COLOVAGINAL FISTULA  PROCEDURE:   LAPAROSCOPIC LYSIS OF ADHESIONS LAPAROSCOPIC RESECTION OF COLORECTAL ANASTOMOSIS END COLOSTOMY OVERSEW OF RECTAL STUMP OMENTOPEXY OF RECTAL STUMP & VAGINAL CUFF  SURGEON:  [name], MD  ASSISTANT: RN   ANESTHESIA:   local and general  EBL:  Total I/O In: 2400 [I.V.:2400] Out: 29 [Urine:260; Blood:200]  Delay start of Pharmacological VTE agent (>24hrs) due to surgical blood loss or risk of bleeding:  no  DRAINS: 19 Fr drain with tip in the pelvis  SPECIMEN:  PROXIMAL & DISTAL COLORECTAL ANASTOMOSIS  DISPOSITION OF SPECIMEN:  PATHOLOGY  COUNTS:  YES  PLAN OF CARE: Admit to inpatient   PATIENT DISPOSITION:  PACU - hemodynamically stable.  INDICATION:    Morbidly obese female with bulky rectal cancer status post neoadjuvant chemoradiation therapy and robotically assisted resection.  Developed delayed hematoma and abscess.  Turned in a chronic fistula.  Required loop ileostomy diversion and omental pedicle  flap.  Area close down.  No leak by colonoscopy and enema.  Underwent ileostomy takedown without incident.  Five weeks later he noted worsening pain and drainage of vagina suspicious for: Bowel fistula.  CT scan showing anastomotic breakdown.  I recommended endoscopic assisted resection of colorectal anastomosis with permanent end colostomy.  The anatomy & physiology of the digestive tract was discussed.  The pathophysiology was discussed.  Natural history risks without surgery was discussed.   I worked to give an overview of the disease and the frequent need to have multispecialty involvement.  I feel the risks of no intervention will lead to serious problems that outweigh the operative risks; therefore, I recommended a partial colectomy to remove the pathology.  Laparoscopic & open techniques were discussed.   Risks such as bleeding, infection, abscess, leak, reoperation, possible ostomy, hernia, heart attack, death, and other risks were discussed.  I noted a good likelihood this will help address the problem.   Goals of post-operative recovery were discussed as well.  We will work to minimize complications.  Educational materials on the pathology had been given in the office.  Questions were answered.    The patient expressed understanding & wished to proceed with surgery.  OR FINDINGS:   Patient had stool resting on her coccyx in the very distal posterior and right lateral aspects.  Breakdown of the anastomosis seen to be more posteriorly.  Remaining rectal stump seemed healthy and noninflamed.  About 4 cm stump remains oversewn with the LOC.  No definite evidence of colovaginal fistula breakdown.  Nonetheless omentum used as an omentopexy of her rectal stump and vaginal,.  End colostomy in left upper quadrant along subcostal space along premarked region.  No obvious metastatic disease on  visceral parietal peritoneum or liver.  Consults: [facility]:   The patient  developed pain and breakdown after ileostomy takedown.  Concerning for colovaginal fistula and rectocutaneous fistula.  Admitted.  Placed on IV antibiotics.  Gentle bowel prep.  She underwent the surgery above.  Postoperatively, the patient gradually mobilized and advanced to a solid diet.  Pain and other symptoms were treated aggressively.  She had preoperative colostomy marking was followed by wound ostomy postoperatively.  The wound was packed and improved.  Resolved after three days of antibiotics.  By the time of discharge, the patient was walking well the hallways, eating food, having flatus.  Pain was well-controlled on an oral medications.  Based on meeting discharge criteria and continuing to recover, I felt it was safe for the patient to be discharged from the hospital to further recover with close followup. Postoperative recommendations were discussed in detail.  They are written as well.  Discharged Condition: fair  Disposition:   01-Home or Self Care  Discharge Instructions    Call MD for:    Complete by:  As directed    FEVER > 101.5 F  (temperatures < 101.5 F are not significant)   Call MD for:  extreme fatigue    Complete by:  As directed    Call MD for:  persistant dizziness or light-headedness    Complete by:  As directed    Call MD for:  persistant nausea and vomiting    Complete by:  As directed    Call MD for:  redness, tenderness, or signs of infection (pain, swelling, redness, odor or green/yellow discharge around incision site)    Complete by:  As directed    Call MD for:  severe uncontrolled pain    Complete by:  As directed    Diet - low sodium heart healthy    Complete by:  As directed    Follow a light diet the first few days at home.   Start with a bland diet such as soups, liquids, starchy foods, low fat foods, etc.   If you feel full, bloated, or constipated, stay on a full liquid or pureed/blenderized diet for a few days until you feel better and no longer  constipated. Be sure to drink plenty of fluids every day to avoid getting dehydrated (feeling dizzy, not urinating, etc.). Gradually add a fiber supplement to your diet   Discharge instructions    Complete by:  As directed    See Discharge Instructions If you are not getting better after two weeks or are noticing you are getting worse, contact our office [phone] for further advice.  We may need to adjust your medications, re-evaluate you in the office, send you to the emergency room, or see what other things we can do to help. The clinic staff is available to answer your questions during regular business hours (8:30am-5pm).  Please don't hesitate to call and ask to speak to one of our nurses for clinical concerns.    A surgeon from [facility] is always on call at the hospitals 24 hours/day If you have a medical emergency, go to the nearest emergency room or call 911.   Driving Restrictions    Complete by:  As directed    You may drive when you are no longer taking narcotic prescription pain medication, you can comfortably wear a seatbelt, and you can safely make sudden turns/stops to protect yourself without hesitating due to pain.   Increase activity  slowly    Complete by:  As directed    Start light daily activities --- self-care, walking, climbing stairs- beginning the day after surgery.  Gradually increase activities as tolerated.  Control your pain to be active.  Stop when you are tired.  Ideally, walk several times a day, eventually an hour a day.   Most people are back to most day-to-day activities in a few weeks.  It takes 4-8 weeks to get back to unrestricted, intense activity. If you can walk 30 minutes without difficulty, it is safe to try more intense activity such as jogging, treadmill, bicycling, low-impact aerobics, swimming, etc. Save the most intensive and strenuous activity for last (Usually 4-8 weeks after surgery) such as sit-ups, heavy lifting, contact  sports, etc.  Refrain from any intense heavy lifting or straining until you are off narcotics for pain control.  You will have off days, but things should improve week-by-week. DO NOT PUSH THROUGH PAIN.  Let pain be your guide: If it hurts to do something, don't do it.  Pain is your body warning you to avoid that activity for another week until the pain goes down.   Lifting restrictions    Complete by:  As directed    If you can walk 30 minutes without difficulty, it is safe to try more intense activity such as jogging, treadmill, bicycling, low-impact aerobics, swimming, etc. Save the most intensive and strenuous activity for last (Usually 4-8 weeks after surgery) such as sit-ups, heavy lifting, contact sports, etc.  Refrain from any intense heavy lifting or straining until you are off narcotics for pain control.  You will have off days, but things should improve week-by-week. DO NOT PUSH THROUGH PAIN.  Let pain be your guide: If it hurts to do something, don't do it.  Pain is your body warning you to avoid that activity for another week until the pain goes down.   May walk up steps    Complete by:  As directed    No wound care    Complete by:  As directed    It is good for closed incision and even open wounds to be washed every day.  Shower every day.  Short baths are fine.  Wash the incisions and wounds clean with soap & water.    If you have a closed incision(s), wash the incision with soap & water every day.  You may leave closed incisions open to air if it is dry.   You may cover the incision with clean gauze & replace it after your daily shower for comfort. If you have skin tapes (Steristrips) or skin glue (Dermabond) on your incision, leave them in place.  They will fall off on their own like a scab.  You may trim any edges that curl up with clean scissors.  If you have staples, set up an appointment for them to be removed in the office in 10 days after surgery.  If you have a drain, wash  around the skin exit site with soap & water and place a new dressing of gauze or band aid around the skin every day.  Keep the drain site clean & dry.   Sexual Activity Restrictions    Complete by:  As directed    You may have sexual intercourse when it is comfortable. If it hurts to do something, stop.      Allergies as of 07/17/2016      Reactions   Caine-1 [lidocaine] Rash   Eyes  swell shut; includes all caine drugs except marcaine. EMLA cream OK though (?!)   Flagyl [metronidazole] Nausea Only   Adhesive [tape] Other (See Comments)   Blisters - can use paper tape   Iron Nausea Only   Oxycodone    NIGHTMARES. (tolerates hydrocodone or tramadol better)   Penicillins Rash   Has patient had a PCN reaction causing immediate rash, facial/tongue/throat swelling, SOB or lightheadedness with hypotension: no Has patient had a PCN reaction causing severe rash involving mucus membranes or skin necrosis: no Has patient had a PCN reaction that required hospitalization no Has patient had a PCN reaction occurring within the last 10 years: no If all of the above answers are "NO", then may proceed with Cephalosporin use.   Sulfa Antibiotics Rash   Rash & Vomiting      Medication List    STOP taking these medications   traMADol 50 MG tablet Commonly known as:  ULTRAM     TAKE these medications   CoQ-10 100 MG Caps Take 100 mg by mouth daily.   diphenoxylate-atropine 2.5-0.025 MG tablet Commonly known as:  LOMOTIL TAKE ONE TABLET BY MOUTH FOUR TIMES DAILY AS NEEDED FOR DIARRHEA   DULoxetine 60 MG capsule Commonly known as:  CYMBALTA Take 60 mg by mouth at bedtime.   esomeprazole 40 MG capsule Commonly known as:  NEXIUM Take 1 capsule (40 mg total) by mouth 2 (two) times daily before a meal.   HYDROcodone-acetaminophen 7.5-325 MG tablet Commonly known as:  NORCO Take 1-2 tablets by mouth every 4 (four) hours as needed for moderate pain or severe pain. What changed:  Another  medication with the same name was added. Make sure you understand how and when to take each.   HYDROcodone-acetaminophen 7.5-325 MG tablet Commonly known as:  NORCO Take 1-2 tablets by mouth every 4 (four) hours as needed for moderate pain or severe pain. What changed:  You were already taking a medication with the same name, and this prescription was added. Make sure you understand how and when to take each.   hyoscyamine 0.375 MG 12 hr tablet Commonly known as:  LEVBID Take 0.375 mg by mouth 2 (two) times daily.   lidocaine-prilocaine cream Commonly known as:  EMLA Apply to affected area once before port access What changed:  how much to take  how to take this  when to take this  additional instructions   Melatonin 5 MG Caps Take 10 mg by mouth at bedtime.   meloxicam 15 MG tablet Commonly known as:  MOBIC Take 15 mg by mouth daily.   methocarbamol 500 MG tablet Commonly known as:  ROBAXIN Take 2 tablets (1,000 mg total) by mouth every 6 (six) hours as needed for muscle spasms. What changed:  how much to take  when to take this   ondansetron 8 MG tablet Commonly known as:  ZOFRAN Take 1 tablet (8 mg total) by mouth 2 (two) times daily as needed for nausea or vomiting. Start the day after chemo for 2 days. Then take as needed for nausea or vomiting.   promethazine 25 MG tablet Commonly known as:  PHENERGAN Take 25 mg by mouth every 8 (eight) hours as needed for nausea or vomiting.   Vitamin D (Ergocalciferol) 50000 units Caps capsule Commonly known as:  DRISDOL Take 50,000 Units by mouth 2 (two) times a week.       Significant Diagnostic Studies:  Results for orders placed or performed during the hospital encounter of 07/10/16 (  from the past 72 hour(s))  Basic metabolic panel     Status: Abnormal   Collection Time: 07/15/16  6:08 AM  Result Value Ref Range   Sodium 139 135 - 145 mmol/L   Potassium 3.8 3.5 - 5.1 mmol/L    Comment: DELTA CHECK NOTED    Chloride 104 101 - 111 mmol/L   CO2 27 22 - 32 mmol/L   Glucose, Bld 81 65 - 99 mg/dL   BUN 9 6 - 20 mg/dL   Creatinine, Ser 0.67 0.44 - 1.00 mg/dL   Calcium 8.6 (L) 8.9 - 10.3 mg/dL   GFR calc non Af Amer >60 >60 mL/min   GFR calc Af Amer >60 >60 mL/min    Comment: (NOTE) The eGFR has been calculated using the CKD EPI equation. This calculation has not been validated in all clinical situations. eGFR's persistently <60 mL/min signify possible Chronic Kidney Disease.    Anion gap 8 5 - 15  Creatinine, serum     Status: None   Collection Time: 07/17/16  5:19 AM  Result Value Ref Range   Creatinine, Ser 0.65 0.44 - 1.00 mg/dL   GFR calc non Af Amer >60 >60 mL/min   GFR calc Af Amer >60 >60 mL/min    Comment: (NOTE) The eGFR has been calculated using the CKD EPI equation. This calculation has not been validated in all clinical situations. eGFR's persistently <60 mL/min signify possible Chronic Kidney Disease.     No results found.  Discharge Exam: Blood pressure 139/67, pulse 63, temperature 98.1 F (36.7 C), temperature source Oral, resp. rate 16, height _0  (1.778 m), weight 133.1 kg (293 lb 6.9 oz), SpO2 97 %.  General: Pt awake/alert/oriented x4 in mild acute distress Eyes: PERRL, normal EOM.  Sclera clear.  No icterus Neuro: CN II-XII intact w/o focal sensory/motor deficits. Lymph: No head/neck/groin lymphadenopathy Psych:  No delerium/psychosis/paranoia.  Tearful but consolable HENT: Normocephalic, Mucus membranes moist.  No thrush Neck: Supple, No tracheal deviation Chest: No chest wall pain w good excursion CV:  Pulses intact.  Regular rhythm MS: Normal AROM mjr joints.  No obvious deformity Abdomen: Soft.  Nondistended.  Nontender.  No evidence of peritonitis.  No incarcerated hernias. GU - feculent drainage Rectal: Some liquid diarrhea.  Wick in place with less erythema Ext:  SCDs BLE.  No mjr edema.  No cyanosis Skin: No petechiae / purpura  Past Medical  History:  Diagnosis Date  . Anemia   . Anxiety   . Bulging disc    L5- not a problem today 05-30-16.  . Cancer [facility])    rectal cancer- surgery, chemo last 1 yr ago, radiation.Dr. Angelia Mould. Lisbeth Renshaw.  . Complication of anesthesia   . Cystadenoma of right ovary s/p RSO 05/25/2015 06/28/2015   resovlevd with surgery  . Depression   . Fibroids    resolved with hysterectomy.  Marland Kitchen GERD (gastroesophageal reflux disease)   . Heart murmur   . History of blood transfusion   . History of chemotherapy   . History of frequent ear infections   . History of frequent urinary tract infections    middle school age- no recent issues  . History of hiatal hernia   . History of ileostomy   . History of mitral valve prolapse   . Hypercholesteremia   . Hypertension    B/P med stopped  2 months ago.  . IBS (irritable bowel syndrome)   . Large bowel delayed anastomotic leak s/p diverting loop ileostomy 11/17/2015  11/17/2015  . Mild sleep apnea    denies -was evaluated-did not have-no cpap ever used  . Numbness and tingling    great toe bilat left more than right , remains  . Obesity   . Occasional tremors   . Osteoarthritis, knee    left shoulder and left knee  . Pelvic abscess s/p drainage & omental pedicle flap 11/17/2015 06/23/2015  . PONV (postoperative nausea and vomiting)    severe" needs Scopolamine PATCH" post op.  . Portacath in place    right chest  . Rectal bleeding   . S/P radiation therapy 02/20/15-03/30/15   colon/rectal  . Tear of left rotator cuff    surgery done but remains with some pain intermittent.  . TMJ (dislocation of temporomandibular joint)   . Vertigo   . Vitamin D deficiency   . Wears glasses     Past Surgical History:  Procedure Laterality Date  . ABDOMINAL HYSTERECTOMY  1996   Removed uterus, and tubes  . COLON RESECTION N/A 07/12/2016   Procedure: LAPAROSCOPIC LYSIS OF ADHESIONS, OMENTOPEXY, HAND ASSISTED RESECTION OF  COLON, END TO END ANASTOMOSIS, COLOSTOMY;   Surgeon: Michael Boston, MD;  Location: [facility];  Service: General;  Laterality: N/A;  . DIAGNOSTIC LAPAROSCOPY     11'16 right oophorectomy  . DILATION AND CURETTAGE OF UTERUS     times 2  . EUS N/A 01/18/2015   Procedure: LOWER ENDOSCOPIC ULTRASOUND (EUS);  Surgeon: Arta Silence, MD;  Location: [facility];  Service: Endoscopy;  Laterality: N/A;  . EXCISION OF SKIN TAG  11/17/2015   Procedure: EXCISION OF SKIN TAG;  Surgeon: Michael Boston, MD;  Location: [facility];  Service: General;;  . ILEO LOOP COLOSTOMY CLOSURE N/A 11/17/2015   Procedure: LAPAROSCOPIC DIVERTING LOOP ILEOSTOMY  DRAINAGE OF PELVIC ABSCESS;  Surgeon: Michael Boston, MD;  Location: [facility];  Service: General;  Laterality: N/A;  . ILEOSTOMY CLOSURE N/A 05/31/2016   Procedure: TAKEDOWN LOOP ILEOSTOMY;  Surgeon: Michael Boston, MD;  Location: [facility];  Service: General;  Laterality: N/A;  . IMPACTION REMOVAL  11/17/2015   Procedure: DISIMPACTION REMOVAL;  Surgeon: Michael Boston, MD;  Location: [facility];  Service: General;;  . JP drain     . LAPAROSCOPIC LYSIS OF ADHESIONS  11/17/2015   Procedure: LAPAROSCOPIC LYSIS OF ADHESIONS;  Surgeon: Michael Boston, MD;  Location: [facility];  Service: General;;  . left knee arthroscopy     mult  . PORTACATH PLACEMENT N/A 05/25/2015   Procedure: INSERTION PORT-A-CATH;  Surgeon: Michael Boston, MD;  Location: [facility];  Service: General;  Laterality: N/A;-remains inplace Right chest.  . ROTATOR CUFF REPAIR Left   . TONSILLECTOMY    . XI ROBOTIC ASSISTED LOWER ANTERIOR RESECTION N/A 05/25/2015   Procedure: XI ROBOTIC ASSISTED LOWER ANTERIOR RESECTION, , RIGID PROCTOSCOPY, RIGHT OOPHORECTOMY;  Surgeon: Michael Boston, MD;  Location: [facility];  Service: General;  Laterality: N/A;    Social History   Social History  . Marital status: Married    Spouse name: N/A  . Number of children: N/A  . Years of education: N/A   Occupational History  . Not on file.   Social History Main Topics  . Smoking status: Former Smoker     Packs/day: 0.50    Years: 7.00    Types: Cigarettes    Quit date: 07/16/1991  . Smokeless tobacco: Never Used     Comment: 1 pack/week intermittently x 7 years  . Alcohol use 0.6 oz/week    1  Shots of liquor per week     Comment: rarely   . Drug use: No  . Sexual activity: Yes    Birth control/ protection: Surgical   Other Topics Concern  . Not on file   Social History Narrative   Tobacco Use: Cigarettes - Former Smoker   Alcohol: Yes, very rare, liquor.    No recreational drug use   Occupation: Head CMA @ [employer]   Marital Status: Married    Husband: Roselyn Reef Disabled   Children: 2 adopted kids [place]   Religion: [employer] [place]                   Family History  Problem Relation Age of Onset  . Coronary artery disease Mother 30  . Hypertension Mother   . Hyperlipidemia Mother   . Diabetes Mellitus I Mother   . Coronary artery disease Father   . Hyperlipidemia Father   . Hypertension Father   . Cancer Sister     skin - non melanoma  . Hyperlipidemia Brother   . Cancer Maternal Uncle 22    pancreatic with mets to colon and prostate  . Cirrhosis Maternal Uncle   . Hypertension Maternal Grandmother   . Diabetes Mellitus I Maternal Grandmother   . Hyperlipidemia Maternal Grandmother   . CVA Maternal Grandmother   . Hypertension Maternal Grandfather   . Coronary artery disease Maternal Grandfather 65  . Hyperlipidemia Maternal Grandfather   . Coronary artery disease Paternal Grandmother   . Hypertension Paternal Grandmother   . Hyperlipidemia Paternal Grandmother   . Diabetes Mellitus I Paternal Grandmother   . Hypertension Paternal Grandfather   . Hyperlipidemia Paternal Grandfather   . Coronary artery disease Paternal Grandfather     Current Facility-Administered Medications  Medication Dose Route Frequency Provider Last Rate Last Dose  . alum & mag hydroxide-simeth (MAALOX/MYLANTA) 200-200-20 MG/5ML suspension 30 mL  30 mL  Oral Q6H PRN Michael Boston, MD   30 mL at 07/16/16 1348  . alvimopan (ENTEREG) capsule 12 mg  12 mg Oral BID Michael Boston, MD   12 mg at 07/16/16 2354  . chlorproMAZINE (THORAZINE) 25 mg in sodium chloride 0.9 % 25 mL IVPB  25 mg Intravenous Q6H PRN Michael Boston, MD      . diphenhydrAMINE (BENADRYL) 12.5 MG/5ML elixir 12.5 mg  12.5 mg Oral Q6H PRN Michael Boston, MD       Or  . diphenhydrAMINE (BENADRYL) injection 12.5 mg  12.5 mg Intravenous Q6H PRN Michael Boston, MD      . DULoxetine (CYMBALTA) DR capsule 60 mg  60 mg Oral BID Michael Boston, MD   60 mg at 07/16/16 2353  . feeding supplement (ENSURE ENLIVE) (ENSURE ENLIVE) liquid 237 mL  237 mL Oral BID BM Michael Boston, MD      . hydrALAZINE (APRESOLINE) injection 10 mg  10 mg Intravenous Q2H PRN Michael Boston, MD      . HYDROcodone-acetaminophen [facility]) 7.5-325 MG per tablet 1-2 tablet  1-2 tablet Oral Q4H PRN Michael Boston, MD   2 tablet at 07/17/16 [phone]  . HYDROmorphone (DILAUDID) injection 1-2 mg  1-2 mg Intravenous Q2H PRN Michael Boston, MD      . hyoscyamine (LEVBID) 0.375 MG 12 hr tablet 0.375 mg  0.375 mg Oral Q12H Michael Boston, MD      . lidocaine-prilocaine (EMLA) cream 1 application  1 application Topical Once PRN Michael Boston, MD      . lip balm (  CARMEX) ointment 1 application  1 application Topical BID Michael Boston, MD   1 application at 70/62/37 2200  . LORazepam (ATIVAN) injection 0.5-1 mg  0.5-1 mg Intravenous Q8H PRN Michael Boston, MD      . magic mouthwash  15 mL Oral QID PRN Michael Boston, MD      . meloxicam [facility]) tablet 15 mg  15 mg Oral Daily Michael Boston, MD   15 mg at 07/16/16 1100  . menthol-cetylpyridinium (CEPACOL) lozenge 3 mg  1 lozenge Oral PRN Michael Boston, MD      . methocarbamol (ROBAXIN) tablet 500-1,000 mg  500-1,000 mg Oral Q8H PRN Michael Boston, MD   1,000 mg at 07/16/16 2354  . multivitamin with minerals tablet 1 tablet  1 tablet Oral Daily Michael Boston, MD   1 tablet at 07/16/16 1100  . ondansetron (ZOFRAN-ODT)  disintegrating tablet 4 mg  4 mg Oral Q6H PRN Michael Boston, MD       Or  . ondansetron [facility]) injection 4 mg  4 mg Intravenous Q6H PRN Michael Boston, MD   4 mg at 07/14/16 1032  . pantoprazole (PROTONIX) EC tablet 80 mg  80 mg Oral Q1200 Michael Boston, MD   80 mg at 07/16/16 1244  . phenol (CHLORASEPTIC) mouth spray 2 spray  2 spray Mouth/Throat PRN Michael Boston, MD      . prochlorperazine (COMPAZINE) injection 5-10 mg  5-10 mg Intravenous Q4H PRN Michael Boston, MD      . promethazine (PHENERGAN) tablet 25 mg  25 mg Oral Q8H PRN Michael Boston, MD   25 mg at 07/11/16 6283  . scopolamine (TRANSDERM-SCOP) 1 MG/3DAYS 1.5 mg  1 patch Transdermal Q72H Nolon Nations, MD   1.5 mg at 07/15/16 1251  . simethicone (MYLICON) chewable tablet 40 mg  40 mg Oral Q6H PRN Michael Boston, MD   40 mg at 07/15/16 1433  . sodium chloride flush (NS) 0.9 % injection 10-40 mL  10-40 mL Intracatheter PRN Michael Boston, MD      . Vitamin D (Ergocalciferol) (DRISDOL) capsule 50,000 Units  50,000 Units Oral Once per day on Tue Sat Michael Boston, MD   50,000 Units at 07/16/16 1100   Facility-Administered Medications Ordered in Other Encounters  Medication Dose Route Frequency Provider Last Rate Last Dose  . 0.9 %  sodium chloride infusion   Intravenous Once Truitt Merle, MD         Allergies  Allergen Reactions  . Caine-1 [Lidocaine] Rash    Eyes swell shut; includes all caine drugs except marcaine. EMLA cream OK though (?!)  . Flagyl [Metronidazole] Nausea Only  . Adhesive [Tape] Other (See Comments)    Blisters - can use paper tape  . Iron Nausea Only  . Oxycodone     NIGHTMARES. (tolerates hydrocodone or tramadol better)  . Penicillins Rash    Has patient had a PCN reaction causing immediate rash, facial/tongue/throat swelling, SOB or lightheadedness with hypotension: no Has patient had a PCN reaction causing severe rash involving mucus membranes or skin necrosis: no Has patient had a PCN reaction that required  hospitalization no Has patient had a PCN reaction occurring within the last 10 years: no If all of the above answers are "NO", then may proceed with Cephalosporin use.   . Sulfa Antibiotics Rash    Rash & Vomiting    Signed: Morton Peters, M.D., F.A.C.S. Gastro[facility] [address], [phone]  [place], [place] [id number] [phone] Main / Paging   07/17/2016, 7:29 AM

## 2016-08-02 ENCOUNTER — Other Ambulatory Visit: Payer: Self-pay | Admitting: Nurse Practitioner

## 2016-08-07 MED FILL — HYDROCODON-APAP 5-325: 5-325 | 4 days supply | Qty: 40 | Fill #0

## 2016-08-26 MED FILL — HYDROCODON-APAP 5-325: 5-325 | 3 days supply | Qty: 40 | Fill #0

## 2016-09-06 MED FILL — HYDROCODON-APAP 5-325: 5-325 | 4 days supply | Qty: 40 | Fill #0

## 2016-09-16 NOTE — Progress Notes (Signed)
[place]  Telephone:([phone]) [phone] Fax:(336) [phone]  Clinic Follow Up Note   Patient Care Team: [name], MD as PCP - General (Family Medicine) Michael Boston, MD as Consulting Physician (General Surgery) Truitt Merle, MD as Consulting Physician (Medical Oncology) Kyung Rudd, MD as Consulting Physician (Radiation Oncology) Teena Irani, MD as Consulting Physician (Gastroenterology) Raynelle Bring, MD as Consulting Physician (Urology) 09/24/2016  CHIEF COMPLAINTS:  Follow up rectal cancer   Oncology History   Rectal adenocarcinoma   Staging form: Colon and Rectum, AJCC 7th Edition     Clinical: T3, N2, M0 - Unsigned       Rectal adenocarcinoma s/p LAR resection 05/25/2015   01/13/2015 Initial Diagnosis    Rectal adenocarcinoma      01/13/2015 Procedure    Colonoscopy showed a sensitivity O nonobstructing mass in the rectum and from 12-18 cm proximal to the Annis. The mass was circumferential, measuring about 6 cm in length. EGD was negative.      01/17/2015 Tumor Marker    CEA 1.4, CA-19-9 8, MMR normal       01/18/2015 Procedure    Lower endoscopic ultrasound by Dr. Paulita Fujita showed a T3 N2 rectal mass.      01/20/2015 Imaging    CT abdomen and pelvis with contrast showed right lateral rectal wall exophytic mass, and 2 ill-defined hepatic hypoenhancing lesions which appears to correspond to the lesion seen on the prior MRI in 2015.      02/05/2015 Imaging    abdomen MRI showed 2 hemangioma, no suspicion for metastatic disease. CT chest was negative      02/20/2015 - 03/30/2015 Radiation Therapy    neoadjuvant RT to rectal cancer       02/20/2015 Concurrent Chemotherapy    capecitabine 2500 mg in the morning and 2000 mg in the evening (859m/m2, bid), on the day of radiation.       05/25/2015 Surgery    Recto-sigmoid segmental resection, margins are negative       05/25/2015 Pathology Results    0.2cm residual invasive adenocarcinoma, G2, negative margins,  12 nodes were negative       06/23/2015 - 06/29/2015 Hospital Admission    Patient was admitted for pelvic abscess, drain placed and she was treated with IV antibiotics, she also received 1 dose of Feraheme for anemia.      09/15/2015 - 09/17/2015 Hospital Admission    Recurrent pelvic collection s/p perc drainage 09/16/2015      02/21/2016 Imaging    CT abdomen and pelvis w contrast IMPRESSION: 1. Interval removal of surgical drains since 12/19/2015 from the presacral space. Similar size of ill-defined presacral fluid and gas, for which residual abscess cannot be excluded. Similar amount of intraperitoneal edema throughout the upper pelvis with foci of extraluminal gas, possibly related to the presacral process. 2. Diverting right-sided ileostomy, without acute complication. 3. Similar moderate hydroureteronephrosis, likely due to the pelvic process. 4. Possible bladder wall thickening. Correlate with urinalysis. This appearance could be partially due to underdistention. 5. Geographic hepatic steatosis and hemangiomas. Cannot exclude a new right hepatic lobe lesion. Consider nonemergent pre and post contrast outpatient MRI. This could represent a new area of focal steatosis.      05/31/2016 Surgery    ileostomy takedown BY Dr. GJohney Maine      07/04/2016 Imaging    CT Abdomen Pelvis W Contrast 07/04/16 IMPRESSION: Subcutaneous fluid collection at the site of ileostomy takedown, could be sterile or infected, measuring 9.3 x 4.3  x 5.3 cm; this could be aspirated under ultrasound guidance if clinically indicated. Large collection of stool within the presacral space, could potentially be within a distended distorted rectum but since this occupies the same position as the abscess collection identified on the previous CT this is suspicious for a contiguous contained extraluminal stool collection communicating with the rectosigmoid colon, collection overall roughly 7 x 8 x 9 cm.      07/12/2016  Surgery    Segmental resection of the proximal and distal colon for a colovaginal fistula revealed no malignancy.       HISTORY OF PRESENTING ILLNESS:  Lindsay Little 51 y.o. female is here because of evaluation for management for newly diagnosed rectal adenocarcinoma.   On 09/06/13, she presented to Dr. Starr [facility]] with 39-monthhistory of diarrhea and worsening abdominal pain. She has a history of chronic abdominal pain and had been [place] Dr. HAmedeo Plenty Abdominal [place]orea3/6/15 noted to have some hypoechoicity in the left hepatic lobe which was confirmed on MRI to be two hemangiomas  in the right hepatic lobe of measuring up to 3.0 cm along with an additional 7 mm probable cyst.   She presented with worsening of diarrhea for 6 month and was reevaluated by Dr. HAmedeo Plenty She was seen on 12/06/14 at which time it was decided she would undergo HIDA scan given that the pain was intermittently in the RUQ in addition to upper and lower endoscopy. HIDA scan 12/15/14 was reassuring, but she was found to have a malignant tumor of the rectum about 12-18 cm proximal to the anus, gastritis, gastric polyps on endoscopies 01/13/15. Biopsies for these three specimens along with the duodenum were collected and resulted on 01/17/15 for invasive adenocarcinoma, chronic gastritis [H. Pylori negative], fundic gland polyps, and non-specific mild patchy intra-epithelial lymphocytes. Endoscopic UKorea7/6/16 staged the mass at T3N2Mx and she is here today for consideration of neoadjuvant therapy.    Today, she does report abdominal pain but is no different than her baseline [right-sided, upper, lower] but has had worsening appetite over the last 2 months. She does have diarrhea with bowel movements every 1-2 hours, especially after eating, and has been noticing bleeding with her bowel movement following her endoscopic evaluation. She  also feels her energy level has decreased. Otherwise, she denies nausea, chest pain, shortness of breath.    Family history is notable for a maternal uncle who had pancreatic cancer diagnosed in his mid to late 663sthat spread to the colon and prostate before he died about 159years later and a sister with non-melanoma skin cancer. 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 She smoked roughly 20 pack years [1 pack/week x 7 years] in the past and reports occasional alcohol use but denies any prior illicit drug use.  She lost about 7 lbs in the past 2 months  CURRENT THERAPY: Surveillance  INTERIM HISTORY DHilda Bladesreturns for follow-up with her husband. Segmental resection of the proximal and distal colon on 07/12/16 for a colovaginal fistua revealed no malignancy. The patient reports her energy has not returned. She still reports lower abdomen pain described as cramps. States that the cramps are similar in feeling to those she had during her menstrual cycle. She reports the abdominal pain would last 4-5 days. She is taking Norco for her pain. She  reports normal bowel movements after her ileostomy takedown with about 2 bowel movements a day. She reports occasional loose bowels. Her appetite is almost normal and is staying hydrated. She did not do pelvic floor therapy due to having her segmental resection. Reports that she and her husband are still packing the surgical site. They describe some discharge as greenish in color. Denies the wound being red, but some blood though. She denies fever and will see Dr. Johney Maine tomorrow.   HISTORY:  Past Medical History:  Diagnosis Date  . Anemia   . Anxiety   . Bulging disc    L5- not a problem today 05-30-16.  . Cancer [facility])    rectal cancer- surgery, chemo last 1 yr ago, radiation.Dr. Angelia Mould. Lisbeth Renshaw.  . Complication of anesthesia   . Cystadenoma of right ovary s/p RSO 05/25/2015 06/28/2015   resovlevd with  surgery  . Depression   . Fibroids    resolved with hysterectomy.  Marland Kitchen GERD (gastroesophageal reflux disease)   . Heart murmur   . History of blood transfusion   . History of chemotherapy   . History of frequent ear infections   . History of frequent urinary tract infections    middle school age- no recent issues  . History of hiatal hernia   . History of ileostomy   . History of mitral valve prolapse   . Hypercholesteremia   . Hypertension    B/P med stopped  2 months ago.  . IBS (irritable bowel syndrome)   . Large bowel delayed anastomotic leak s/p diverting loop ileostomy 11/17/2015 11/17/2015  . Mild sleep apnea    denies -was evaluated-did not have-no cpap ever used  . Numbness and tingling    great toe bilat left more than right , remains  . Obesity   . Occasional tremors   . Osteoarthritis, knee    left shoulder and left knee  . Pelvic abscess s/p drainage & omental pedicle flap 11/17/2015 06/23/2015  . PONV (postoperative nausea and vomiting)    severe" needs Scopolamine PATCH" post op.  . Portacath in place    right chest  . Rectal bleeding   . S/P radiation therapy 02/20/15-03/30/15   colon/rectal  . Tear of left rotator cuff    surgery done but remains with some pain intermittent.  . TMJ (dislocation of temporomandibular joint)   . Vertigo   . Vitamin D deficiency   . Wears glasses     SURGICAL HISTORY: Past Surgical History:  Procedure Laterality Date  . ABDOMINAL HYSTERECTOMY  1996   Removed uterus, and tubes  . COLON RESECTION N/A 07/12/2016   Procedure: LAPAROSCOPIC LYSIS OF ADHESIONS, OMENTOPEXY, HAND ASSISTED RESECTION OF  COLON, END TO END ANASTOMOSIS, COLOSTOMY;  Surgeon: Michael Boston, MD;  Location: [facility];  Service: General;  Laterality: N/A;  . DIAGNOSTIC LAPAROSCOPY     11'16 right oophorectomy  . DILATION AND CURETTAGE OF UTERUS     times 2  . EUS N/A 01/18/2015   Procedure: LOWER ENDOSCOPIC ULTRASOUND (EUS);  Surgeon: Arta Silence, MD;   Location: [facility];  Service: Endoscopy;  Laterality: N/A;  . EXCISION OF SKIN TAG  11/17/2015   Procedure: EXCISION OF SKIN TAG;  Surgeon: Michael Boston, MD;  Location: [facility];  Service: General;;  . ILEO LOOP COLOSTOMY CLOSURE N/A 11/17/2015   Procedure: LAPAROSCOPIC DIVERTING LOOP ILEOSTOMY  DRAINAGE OF PELVIC ABSCESS;  Surgeon: Michael Boston, MD;  Location: [facility];  Service: General;  Laterality: N/A;  . ILEOSTOMY  CLOSURE N/A 05/31/2016   Procedure: TAKEDOWN LOOP ILEOSTOMY;  Surgeon: Michael Boston, MD;  Location: [facility];  Service: General;  Laterality: N/A;  . IMPACTION REMOVAL  11/17/2015   Procedure: DISIMPACTION REMOVAL;  Surgeon: Michael Boston, MD;  Location: [facility];  Service: General;;  . JP drain     . LAPAROSCOPIC LYSIS OF ADHESIONS  11/17/2015   Procedure: LAPAROSCOPIC LYSIS OF ADHESIONS;  Surgeon: Michael Boston, MD;  Location: [facility];  Service: General;;  . left knee arthroscopy     mult  . PORTACATH PLACEMENT N/A 05/25/2015   Procedure: INSERTION PORT-A-CATH;  Surgeon: Michael Boston, MD;  Location: [facility];  Service: General;  Laterality: N/A;-remains inplace Right chest.  . ROTATOR CUFF REPAIR Left   . TONSILLECTOMY    . XI ROBOTIC ASSISTED LOWER ANTERIOR RESECTION N/A 05/25/2015   Procedure: XI ROBOTIC ASSISTED LOWER ANTERIOR RESECTION, , RIGID PROCTOSCOPY, RIGHT OOPHORECTOMY;  Surgeon: Michael Boston, MD;  Location: [facility];  Service: General;  Laterality: N/A;    SOCIAL HISTORY: Social History   Social History  . Marital status: Married    Spouse name: N/A  . Number of children: N/A  . Years of education: N/A   Occupational History  . Not on file.   Social History Main Topics  . Smoking status: Former Smoker    Packs/day: 0.50    Years: 7.00    Types: Cigarettes    Quit date: 07/16/1991  . Smokeless tobacco: Never Used     Comment: 1 pack/week intermittently x 7 years  . Alcohol use 0.6 oz/week    1 Shots of liquor per week     Comment: rarely   . Drug use: No  .  Sexual activity: Yes    Birth control/ protection: Surgical   Other Topics Concern  . Not on file   Social History Narrative   Tobacco Use: Cigarettes - Former Smoker   Alcohol: Yes, very rare, liquor.    No recreational drug use   Occupation: Head CMA @ [employer]   Marital Status: Married    Husband: Roselyn Reef Disabled   Children: 2 adopted kids [place]   Religion: [employer] [place] HISTORY: Family History  Problem Relation Age of Onset  . Coronary artery disease Mother 50  . Hypertension Mother   . Hyperlipidemia Mother   . Diabetes Mellitus I Mother   . Coronary artery disease Father   . Hyperlipidemia Father   . Hypertension Father   . Cancer Sister     skin - non melanoma  . Hyperlipidemia Brother   . Cancer Maternal Uncle 4    pancreatic with mets to colon and prostate  . Cirrhosis Maternal Uncle   . Hypertension Maternal Grandmother   . Diabetes Mellitus I Maternal Grandmother   . Hyperlipidemia Maternal Grandmother   . CVA Maternal Grandmother   . Hypertension Maternal Grandfather   . Coronary artery disease Maternal Grandfather 65  . Hyperlipidemia Maternal Grandfather   . Coronary artery disease Paternal Grandmother   . Hypertension Paternal Grandmother   . Hyperlipidemia Paternal Grandmother   . Diabetes Mellitus I Paternal Grandmother   . Hypertension Paternal Grandfather   . Hyperlipidemia Paternal Grandfather   . Coronary artery disease Paternal Grandfather     ALLERGIES:  is allergic to caine-1 [lidocaine]; flagyl [metronidazole]; adhesive [tape]; iron; oxycodone; penicillins; and sulfa antibiotics.  MEDICATIONS:  Current Outpatient Prescriptions  Medication Sig Dispense Refill  . Coenzyme Q10 (COQ-10) 100 MG CAPS Take 100 mg by mouth daily.    . DULoxetine (CYMBALTA) 60 MG capsule Take 60 mg by mouth at bedtime.     Marland Kitchen esomeprazole (NEXIUM) 40 MG capsule Take 1 capsule (40 mg total) by mouth 2  (two) times daily before a meal. 60 capsule 5  . HYDROcodone-acetaminophen (NORCO/VICODIN) 5-325 MG tablet Take 1-2 tablets by mouth every 4 (four) hours as needed.  0  . hyoscyamine (LEVBID) 0.375 MG 12 hr tablet Take 0.375 mg by mouth 2 (two) times daily.     Marland Kitchen lidocaine-prilocaine (EMLA) cream Apply to affected area once before port access (Patient taking differently: Apply 1 application topically See admin instructions. Apply to affected area once before port access) 30 g 3  . Melatonin 5 MG CAPS Take 10 mg by mouth at bedtime.    . meloxicam (MOBIC) 15 MG tablet Take 15 mg by mouth daily.     . methocarbamol (ROBAXIN) 500 MG tablet Take 2 tablets (1,000 mg total) by mouth every 6 (six) hours as needed for muscle spasms. (Patient taking differently: Take 500-1,000 mg by mouth every 8 (eight) hours as needed for muscle spasms. ) 20 tablet 3  . ondansetron (ZOFRAN) 8 MG tablet Take 1 tablet (8 mg total) by mouth 2 (two) times daily as needed for nausea or vomiting. Start the day after chemo for 2 days. Then take as needed for nausea or vomiting. 30 tablet 2  . promethazine (PHENERGAN) 25 MG tablet Take 25 mg by mouth every 8 (eight) hours as needed for nausea or vomiting.     . Vitamin D, Ergocalciferol, (DRISDOL) 50000 UNITS CAPS capsule Take 50,000 Units by mouth 2 (two) times a week.    . diphenoxylate-atropine (LOMOTIL) 2.5-0.025 MG tablet TAKE ONE TABLET BY MOUTH FOUR TIMES DAILY AS NEEDED FOR DIARRHEA (Patient not taking: Reported on 09/24/2016) 30 tablet 1   No current facility-administered medications for this visit.    Facility-Administered Medications Ordered in Other Visits  Medication Dose Route Frequency Provider Last Rate Last Dose  . 0.9 %  sodium chloride infusion   Intravenous Once Truitt Merle, MD        REVIEW OF SYSTEMS:  Constitutional: Denies fevers, chills or abnormal night sweats (+) Fatigue Eyes: Denies blurriness of vision, double vision or watery eyes Ears, nose, mouth,  throat, and face: Denies mucositis or sore throat Respiratory: Denies cough, dyspnea or wheezes Cardiovascular: Denies palpitation, chest discomfort or lower extremity swelling Gastrointestinal:(+) lower abdominal pain  Skin: Denies abnormal skin rashes Lymphatics: Denies new lymphadenopathy or easy bruising Neurological:Denies numbness, tingling or new weaknesses Behavioral/Psych: Mood is stable, no new changes  All other systems were reviewed with the patient and are negative.  PHYSICAL EXAMINATION: ECOG PERFORMANCE STATUS: 2  Vitals:   09/24/16 0912  BP: (!) 141/72  Pulse: (!) 57  Resp: 18  Temp: 99 F (37.2 C)   Filed Weights   09/24/16 0912  Weight: (!) 308 lb 4.8 oz (139.8 kg)    GENERAL:alert, no distress and comfortable SKIN: skin color, texture, turgor are normal, no rashes or significant lesions EYES: normal, conjunctiva are pink and non-injected, sclera clear OROPHARYNX:no exudate, no erythema and lips, buccal mucosa, and tongue normal  NECK: supple, thyroid normal size, non-tender, without nodularity LYMPH:  no palpable lymphadenopathy in the cervical, axillary or inguinal LUNGS: clear to auscultation and percussion with normal breathing effort HEART: regular rate &  rhythm and no murmurs and no lower extremity edema ABDOMEN:abdomen soft, normal bowel sounds. No organomegaly. No ascites. (+) Multiple scars in the abdomen and all well healed. A wound in the suprapubic area covered by gauze. Tenderness beneath her scar. MSK: No cyanosis of digits and no clubbing  PSYCH: alert & oriented x 3 with fluent speech NEURO: no focal motor/sensory deficits   LABORATORY DATA:  I have reviewed the data as listed CBC Latest Ref Rng & Units 09/24/2016 07/13/2016 07/12/2016  WBC 3.9 - 10.3 10e3/uL 6.8 16.2(H) -  Hemoglobin 11.6 - 15.9 g/dL 12.2 10.2(L) 10.9(L)  Hematocrit 34.8 - 46.6 % 38.6 31.4(L) 32.0(L)  Platelets 145 - 400 10e3/uL 292 518(H) -    CMP Latest Ref Rng &  Units 09/24/2016 07/17/2016 07/15/2016  Glucose 70 - 140 mg/dl 99 - 81  BUN 7.0 - 26.0 mg/dL 14.3 - 9  Creatinine 0.6 - 1.1 mg/dL 0.8 0.65 0.67  Sodium 136 - 145 mEq/L 142 - 139  Potassium 3.5 - 5.1 mEq/L 4.0 3.6 3.8  Chloride 101 - 111 mmol/L - - 104  CO2 22 - 29 mEq/L 25 - 27  Calcium 8.4 - 10.4 mg/dL 10.1 - 8.6(L)  Total Protein 6.4 - 8.3 g/dL 7.6 - -  Total Bilirubin 0.20 - 1.20 mg/dL 0.37 - -  Alkaline Phos 40 - 150 U/L 86 - -  AST 5 - 34 U/L 13 - -  ALT 0 - 55 U/L 14 - -   Results for Lindsay Little, Lindsay Little (MRN [id number]) as of 09/16/2016 13:33  Ref. Range 03/25/2016 12:39 05/13/2016 12:33 06/25/2016 09:22  CEA Latest Ref Range: 0.0 - 4.7 ng/mL 2.2 2.3   CEA ([facility]) Latest Ref Range: 0.00 - 5.00 ng/mL 1.67 2.07 1.73    PATHOLOGY REPORT  Diagnosis 07/12/16 1. Colon, segmental resection, Proximal - FOCAL MESENTERIC AND SUBSEROSAL FIBROSIS WITH ASSOCIATED HEMORRHAGE. - TWO BENIGN REACTIVE LYMPH NODES. - NO EVIDENCE OF MALIGNANCY. 2. Colon, segmental resection, Distal - ULCERATION WITH INFLAMMATION AND INFLAMED GRANULATION TISSUE WITH FOREIGN BODY GIANT CELL REACTION, CLINICALLY COLOVAGINAL FISTULA. - NO EVIDENCE OF MALIGNANCY. Claudette Laws MD Pathologist, Electronic Signature (Case signed 07/16/2016)  Diagnosis 05/25/2015 1. Colon, segmental resection for tumor, recto-sigmoid INVASIVE COLONIC ADENOCARCINOMA (0.2 CM IN GREATEST DIMENSION RESIDUAL TUMOR) THE TUMOR INVADE SUBMUCOSA (0.3 CM IN DEPTH, PT1) MARGINS OF RESECTION ARE NEGATIVE FOR TUMOR TWELVE BENIGN LYMPH NODES (0/12) 2. Colon, resection margin (donut), proximal anastomotic ring BENIGN COLONIC TISSUE, NEGATIVE FOR MALIGANCY 3. Colon, resection margin (donut), distal anastomotic ring BENIGN COLONIC TISSUE, NEGATIVE FOR MALIGNANCY 4. Ovary, right BENIGN SEROUS CYSTADENOMA.  Microscopic Comment 1. COLON AND RECTUM (INCLUDING TRANS-ANAL RESECTION): Specimen: Colon-rectum Procedure: Segmental resection Tumor  site: Rectum anterior wall mid to distal third of rectum Specimen integrity: intact Macroscopic intactness of mesorectum: Complete: _ Macroscopic tumor perforation: Negative Invasive tumor: Maximum size: Histologic type(s): G2 Histologic grade and differentiation: G1: well differentiated/low grade G2: moderately differentiated/low grade G3: poorly differentiated/high grade G4: undifferentiated/high grade Type of polyp in which invasive carcinoma arose: _ Microscopic extension of invasive tumor:Submucosa Lymph-Vascular invasion: Not identified Peri-neural invasion: negative Tumor deposit(s) (discontinuous extramural extension): Negative Resection margins: Proximal margin: Negative Distal margin: Negative Circumferential (radial) (posterior ascending, posterior descending; lateral and posterior mid-rectum; and entire lower 1/3 rectum):Negatvie Mesenteric margin (sigmoid and transverse): Negative Distance closest margin (if all above margins negative): 2.5 cm Treatment effect (neo-adjuvant therapy): Present Additional polyp(s): Negative Non-neoplastic findings: Unremarkable Lymph nodes: number examined 12; number positive: 0 Pathologic Staging: T1,  N0, M_ Ancillary studies: Preserved expression of the makor and minor MMR proteins MLH1, MSH2, MSH6 and PMS2. MLH1:    RADIOGRAPHIC STUDIES: I have personally reviewed the radiological images as listed and agreed with the findings in the report.  CT Abdomen Pelvis W Contrast 07/04/16 IMPRESSION: Subcutaneous fluid collection at the site of ileostomy takedown, could be sterile or infected, measuring 9.3 x 4.3 x 5.3 cm; this could be aspirated under ultrasound guidance if clinically indicated. Large collection of stool within the presacral space, could potentially be within a distended distorted rectum but since this occupies the same position as the abscess collection identified on the previous CT this is suspicious for a  contiguous contained extraluminal stool collection communicating with the rectosigmoid colon, collection overall roughly 7 x 8 x 9 cm.   ASSESSMENT & PLAN: Lindsay Little is a 51 y.o. female with chronic abdominal pain with rectal mass on colonoscopy found to be invasive rectal adenocarcinoma.   1. T3N2M0, Stage IIIC rectal adenocarcinoma, ypT1N0 after neoadjuvant chemotherapy and radiation -I previously reviewed her surgical pathology result in great detail with patient and her husband. She has had a good response to neoadjuvant chemoradiation, has a small residual tumor on the surgical specimen, no lymph node metastasis. -I recommended adjuvant chemotherapy, however she has not been able to take it due to the pelvic abscess after surgery and a prolonged recovery. - We'll continue her surveillance. She had multiple CT of the abdomen and pelvis after surgery, last one in August 2017, which did not how evidence of recurrence. Her tumor marker CEA has been normal. -The patient had a colostomy revision on 05/31/16, she had a colonoscopy prior to this, which was unremarkable except a few polyps. -We discussed her CT AP scan on 07/04/16 showing subcutaneous fluid collection at the site of ileostomy takedown and a large collection of stool within the presacral space. No evidence of cancer recurrence. 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 -She has overall improved lately, and IV fluids are not required at this time. -I reviewed her lab results with her, both CBC and CMP are unremarkable, she is clinically doing better, no clinical concern of cancer recurrence. Iron and tumor marker pending  -We'll continue colon cancer surveillance, next CT scan in 3 months.  2. Intermittent abdominal pain and diarrhea  -She unfortunately developed a recurrent pelvic abscess after her surgery,  managed by her surgeon Dr. Johney Maine, she is status post  ileostomy, draining tube has been removed. This has resolved now. -I again discussed a referral for PT in regards to pelvic exercises. The patient is interested in this and therefore I made a referral. -her diarrhea has resolved after her first ileostomy and permanent colostomy.  3. Microcytic anemia, secondary to GI blood loss and iron deficiency anemia -improved anemia since surgery. -We'll continue monitoring her iron level  4. HTN -She'll continue follow-up with her primary care physician  6.  Anorexia and depression -She tried mirtazapine, did not help. - I previously encouraged her to take nutrition supplement -Overall improved lately.  7. Fatigue -I advised the patient that exercise could help with fatigue. We discussed that a referral with PT could help her with this. -Naps during the afternoon are appropriate.   Plan: -Follow up in 3 months -Lab and CT CAP w contrast a few days before her f/u. -Referral to pelvic PT. She will call them to set this up.  -I strongly  encourage her to do PT to help her recover  -The patient is scheduled to see Dr. Johney Maine tomorrow.  I spent 30 mins for the visit, >50% face to face discussion.   Truitt Merle  09/24/2016   This document serves as a record of services personally performed by Truitt Merle, MD. It was created on her behalf by Darcus Austin, a trained medical scribe. The creation of this record is based on the scribe's personal observations and the provider's statements to them. This document has been checked and approved by the attending provider.

## 2016-09-17 ENCOUNTER — Other Ambulatory Visit: Payer: No Typology Code available for payment source

## 2016-09-17 ENCOUNTER — Encounter: Payer: Self-pay | Admitting: Hematology

## 2016-09-19 MED FILL — HYDROCODON-APAP 5-325: 5-325 | 4 days supply | Qty: 40 | Fill #0

## 2016-09-24 ENCOUNTER — Other Ambulatory Visit (HOSPITAL_BASED_OUTPATIENT_CLINIC_OR_DEPARTMENT_OTHER): Payer: No Typology Code available for payment source

## 2016-09-24 ENCOUNTER — Telehealth: Payer: Self-pay | Admitting: Hematology

## 2016-09-24 ENCOUNTER — Ambulatory Visit (HOSPITAL_BASED_OUTPATIENT_CLINIC_OR_DEPARTMENT_OTHER): Payer: No Typology Code available for payment source | Admitting: Hematology

## 2016-09-24 ENCOUNTER — Encounter: Payer: Self-pay | Admitting: Hematology

## 2016-09-24 VITALS — BP 141/72 | HR 57 | Temp 99.0°F | Resp 18 | Ht 70.0 in | Wt 308.3 lb

## 2016-09-24 DIAGNOSIS — F32A Depression, unspecified: Secondary | ICD-10-CM

## 2016-09-24 DIAGNOSIS — K922 Gastrointestinal hemorrhage, unspecified: Secondary | ICD-10-CM

## 2016-09-24 DIAGNOSIS — D500 Iron deficiency anemia secondary to blood loss (chronic): Secondary | ICD-10-CM

## 2016-09-24 DIAGNOSIS — C20 Malignant neoplasm of rectum: Secondary | ICD-10-CM | POA: Diagnosis not present

## 2016-09-24 DIAGNOSIS — Z6841 Body Mass Index (BMI) 40.0 and over, adult: Secondary | ICD-10-CM

## 2016-09-24 DIAGNOSIS — I10 Essential (primary) hypertension: Secondary | ICD-10-CM | POA: Diagnosis not present

## 2016-09-24 DIAGNOSIS — F329 Major depressive disorder, single episode, unspecified: Secondary | ICD-10-CM

## 2016-09-24 LAB — CBC WITH DIFFERENTIAL/PLATELET
BASO%: 0.6 % (ref 0.0–2.0)
Basophils Absolute: 0 10*3/uL (ref 0.0–0.1)
EOS%: 2.8 % (ref 0.0–7.0)
Eosinophils Absolute: 0.2 10*3/uL (ref 0.0–0.5)
HEMATOCRIT: 38.6 % (ref 34.8–46.6)
HEMOGLOBIN: 12.2 g/dL (ref 11.6–15.9)
LYMPH%: 21.6 % (ref 14.0–49.7)
MCH: 26.9 pg (ref 25.1–34.0)
MCHC: 31.6 g/dL (ref 31.5–36.0)
MCV: 85 fL (ref 79.5–101.0)
MONO#: 0.4 10*3/uL (ref 0.1–0.9)
MONO%: 6 % (ref 0.0–14.0)
NEUT%: 69 % (ref 38.4–76.8)
NEUTROS ABS: 4.7 10*3/uL (ref 1.5–6.5)
Platelets: 292 10*3/uL (ref 145–400)
RBC: 4.54 10*6/uL (ref 3.70–5.45)
RDW: 15.6 % — ABNORMAL HIGH (ref 11.2–14.5)
WBC: 6.8 10*3/uL (ref 3.9–10.3)
lymph#: 1.5 10*3/uL (ref 0.9–3.3)

## 2016-09-24 LAB — COMPREHENSIVE METABOLIC PANEL
ALBUMIN: 3.9 g/dL (ref 3.5–5.0)
ALK PHOS: 86 U/L (ref 40–150)
ALT: 14 U/L (ref 0–55)
AST: 13 U/L (ref 5–34)
Anion Gap: 10 mEq/L (ref 3–11)
BUN: 14.3 mg/dL (ref 7.0–26.0)
CALCIUM: 10.1 mg/dL (ref 8.4–10.4)
CO2: 25 mEq/L (ref 22–29)
CREATININE: 0.8 mg/dL (ref 0.6–1.1)
Chloride: 108 mEq/L (ref 98–109)
EGFR: >90 mL/min/{1.73_m2} (ref >90)
GLUCOSE: 99 mg/dL (ref 70–140)
POTASSIUM: 4 meq/L (ref 3.5–5.1)
SODIUM: 142 meq/L (ref 136–145)
Total Bilirubin: 0.37 mg/dL (ref 0.20–1.20)
Total Protein: 7.6 g/dL (ref 6.4–8.3)

## 2016-09-24 LAB — CEA (IN HOUSE-CHCC): CEA (CHCC-IN HOUSE): 1.34 ng/mL (ref 0.00–5.00)

## 2016-09-24 LAB — MAGNESIUM: MAGNESIUM: 1.9 mg/dL (ref 1.5–2.5)

## 2016-09-24 NOTE — Telephone Encounter (Signed)
Appointments scheduled per 3/13 LOS. Patient given AVS report and calendars with future scheduled appointments. Stated that she already has two bottles of contrast for CT scan appointments.

## 2016-10-10 MED FILL — HYDROCODON-APAP 5-325: 5-325 | 3 days supply | Qty: 20 | Fill #0

## 2016-10-24 ENCOUNTER — Ambulatory Visit: Payer: Self-pay | Admitting: Surgery

## 2016-10-24 NOTE — H&P (Signed)
Lindsay Little 10/24/2016 10:29 AM Location: [facility] Patient #: [id number] DOB: January 23, 1966 Married / Language: [name] / Race: White Female  History of Present Illness Lindsay Hector Lindsay Little; 10/24/2016 2:45 PM) The patient is a 51 year old female who presents with colorectal cancer. Note for "Colorectal cancer": Patient returns status post resection of anastomosis with end colostomy and oversewing of rectal stump for presumed colovaginal fistula.  Patient comes in again with her husband. She is feeling much better. She is getting less drainage out her vagina but is not stopped. The wound has closed down. She's still gets some intermittent bouts of pelvic pain, but they are less often. She's been able to use only 20 hydrocodone in the past 2 weeks. She is emptying her colostomy bag once or twice a day. Switch to a different pouch through [employer]. That seems to work better. She saw a staple near her colostomy on the right side. Small but concerning. No flank pain. No nausea or vomiting. Still using narcotics for her flares of pelvic pain. She is hoping for another refill.Marland Kitchen Appetite coming back. Energy level good. She is able to sit for longer periods of time but not back to normal completely. However this is the that she is felt in 2 years.     [name] MRN: [id number] DOB/AGE: April 21, 1966 51 y.o.  Admit date: 07/10/2016 Discharge date:  Patient Care Team: [name], Lindsay Little as PCP - General (Family Medicine) Michael Boston, Lindsay Little as Consulting Physician (General Surgery) Truitt Merle, Lindsay Little as Consulting Physician (Medical Oncology) Kyung Rudd, Lindsay Little as Consulting Physician (Radiation Oncology) Teena Irani, Lindsay Little as Consulting Physician (Gastroenterology) Raynelle Bring, Lindsay Little as Consulting Physician (Urology)  Discharge Diagnoses: Principal Problem: Colorectal delayed anastomotic leak s/p resection & colostomy 07/12/2016 Active Problems: Rectal  adenocarcinoma s/p LAR resection 05/25/2015 Morbid obesity with BMI of 40.0-44.9, adult ([place]) Colovaginal fistula s/p omentopexy repair 07/12/2016 Rectocutaneous fistula Colostomy in place ([place])   POST-OPERATIVE DIAGNOSIS: COLORECTAL ANASTOMOTIC BREAKDOWN WITH COLOVAGINAL FISTULA  PROCEDURE:  LAPAROSCOPIC LYSIS OF ADHESIONS LAPAROSCOPIC RESECTION OF COLORECTAL ANASTOMOSIS END COLOSTOMY OVERSEW OF RECTAL STUMP OMENTOPEXY OF RECTAL STUMP & VAGINAL CUFF  SURGEON: Lindsay Hector, Lindsay Little  ASSISTANT: RN  ANESTHESIA: local and general  EBL: Total I/O In: 2400 [I.V.:2400] Out: 20 [Urine:260; Blood:200]  Delay start of Pharmacological VTE agent (>24hrs) due to surgical blood loss or risk of bleeding: no  DRAINS: 19 Fr drain with tip in the pelvis  SPECIMEN: PROXIMAL & DISTAL COLORECTAL ANASTOMOSIS  DISPOSITION OF SPECIMEN: PATHOLOGY  COUNTS: YES  PLAN OF CARE: Admit to inpatient  PATIENT DISPOSITION: PACU - hemodynamically stable.  INDICATION:   Morbidly obese female with bulky rectal cancer status post neoadjuvant chemoradiation therapy and robotically assisted resection. Developed delayed hematoma and abscess. Turned in a chronic fistula. Required loop ileostomy diversion and omental pedicle flap. Area close down. No leak by colonoscopy and enema. Underwent ileostomy takedown without incident. Five weeks later he noted worsening pain and drainage of vagina suspicious for: Bowel fistula. CT scan showing anastomotic breakdown. I recommended endoscopic assisted resection of colorectal anastomosis with permanent end colostomy.  The anatomy & physiology of the digestive tract was discussed. The pathophysiology was discussed. Natural history risks without surgery was discussed. I worked to give an overview of the disease and the frequent need to have multispecialty involvement. I feel the risks of no intervention will lead to serious problems that outweigh  the operative risks; therefore, I recommended a partial colectomy to remove  the pathology. Laparoscopic & open techniques were discussed.  Risks such as bleeding, infection, abscess, leak, reoperation, possible ostomy, hernia, heart attack, death, and other risks were discussed. I noted a good likelihood this will help address the problem. Goals of post-operative recovery were discussed as well. We will work to minimize complications. Educational materials on the pathology had been given in the office. Questions were answered.   The patient expressed understanding & wished to proceed with surgery.  OR FINDINGS:  Patient had stool resting on her coccyx in the very distal posterior and right lateral aspects. Breakdown of the anastomosis seen to be more posteriorly.  Remaining rectal stump seemed healthy and noninflamed. About 4 cm stump remains oversewn with the LOC.  No definite evidence of colovaginal fistula breakdown. Nonetheless omentum used as an omentopexy of her rectal stump and vaginal.  End colostomy in left upper quadrant along subcostal space along premarked region.  No obvious metastatic disease on visceral parietal peritoneum or liver.      ADDENDUM REPORT: 07/04/2016 18:48 ADDENDUM: Discussed additional history with Dr. Johney Maine: Patient had a prior leak which was passed with omentum. Postoperative contrast enema showed no contrast extravasation. An enema tip was inserted into the rectum and a small amount of water-soluble contrast was instilled retrograde. Repeat CT imaging of the pelvis was then performed. Rectal contrast outlines the stool in the sigmoid colon and rectum, including the large collection of stool in the presacral space. Multiplanar images show that the stool is present within a large patulous rectosigmoid colon without definite contrast extravasation. This does not appear to be a separate contiguous collection but a distended rectosigmoid segment.  Also identified is presence of contrast material within the vagina compatible rectovaginal fistula, question arising at the RIGHT superolateral aspect of the vaginal cuff. Additionally, a linear collection of contrast material is seen extending posteriorly from the rectum RIGHT of the sacrum, extending into the medial aspect of the RIGHT gluteus maximus, consistent with an additional fistulous track. Infiltrative changes extend dorsally from the site on axial images into the subcutaneous fat. Electronically Signed By: Lavonia Dana M.D. On: 07/04/2016 18:48 Addended by Lavonia Dana, Lindsay Little on 07/04/2016 6:50 PM  Study Result CLINICAL DATA: Colon cancer post resection, chemotherapy and radiation therapy, possible fistula EXAM: CT ABDOMEN AND PELVIS WITH CONTRAST TECHNIQUE: Multidetector CT imaging of the abdomen and pelvis was performed using the standard protocol following bolus administration of intravenous contrast. CONTRAST: 36m ISOVUE-300 IOPAMIDOL (ISOVUE-300) INJECTION 61% PO, 1038mISOVUE-300 IOPAMIDOL (ISOVUE-300) INJECTION 61% IV COMPARISON: 02/21/2016 FINDINGS: Lower chest: Lung bases clear Hepatobiliary: Poorly defined area of low-attenuation RIGHT lobe of liver centrally image 17 measuring 2.1 x 1.8 cm. Additional area of poorly defined low-attenuation lateral aspect RIGHT lobe of liver more inferiorly, 2.3 x 1.6 cm image 29. These lesions have not significantly changed in size since the previous exam. Pancreas: Normal appearance Spleen: Normal appearance Adrenals/Urinary Tract: Adrenal glands normal appearance. Kidneys and ureters normal appearance. RIGHT hydronephrosis and hydroureter seen on previous exam resolved. Bladder wall appears mildly thickened but bladder is underdistended question artifact. Stomach/Bowel: Stomach normal appearance. Small bowel loops nondistended without wall thickening. Increased stool throughout colon. Interval takedown of ostomy in RIGHT  upper quadrant question loop ileostomy. Staple line at enterotomy closure in the RIGHT mid abdomen. Large subcutaneous fluid collection at site of ostomy take-down, 9.3 x 4.3 x 5.3 cm, may represent a sterile or infected collection. Mild overlying skin thickening and minimal surrounding infiltrative changes. Rectum distended by stool with  mild thickening of the wall with adjacent infiltration of presacral and perirectal fat, question related to radiation therapy and prior surgery. A large collection of stool and gas is seen within the presacral space. This appears to be predominantly cranial to the anastomotic suture line more inferiorly. This roughly occupies the same location as the gas and fluid collection identified on the prior CT. While this could represent stool within a distended rectum, the fact that this courses cranially in the same distribution as the previous abscess collection suggests this may be stool extraluminal to the rectosigmoid colon within a contiguous contained collection. This overall measures approximately 8 cm transverse by 7 cm AP and extends 9 cm length. Vascular/Lymphatic: Multiple normal sized para-aortic and LEFT common iliac nodes. No additional abdominal or pelvic adenopathy. Aorta normal caliber. 7 mm LEFT external iliac node image 86 unchanged. Multiple small nodules in the fat adjacent to the rectosigmoid colon axial images 73, 74, 78 question tiny lymph nodes, largest 8 mm short axis coronal image 62, minimally increased. Reproductive: Post hysterectomy. RIGHT ovary not visualized. Suspect visualized LEFT ovary. Air within vagina. Other: No free air or free fluid. No hernia. Musculoskeletal: Osseous structures unremarkable. IMPRESSION: Subcutaneous fluid collection at the site of ileostomy takedown, could be sterile or infected, measuring 9.3 x 4.3 x 5.3 cm; this could be aspirated under ultrasound guidance if clinically indicated. Large collection of stool within  the presacral space, could potentially be within a distended distorted rectum but since this occupies the same position as the abscess collection identified on the previous CT this is suspicious for a contiguous contained extraluminal stool collection communicating with the rectosigmoid colon, collection overall roughly 7 x 8 x 9 cm. Electronically Signed: By: Lavonia Dana M.D. On: 07/04/2016 16:58  Result History  CT ABDOMEN PELVIS W CONTRAST (Order #[id number]) on 07/04/2016 - Order Result History Report - Result Edited <[email or web address]> Result Notes  Notes Recorded by Michael Boston, Lindsay Little on 07/05/2016 at 7:44 AM EST Called and discussed with patient. She is eating okay. Intermittent diarrhea controlled with Imodium. I suspect its more overflow diarrhea around constantly chronically constipated rectum. While she is severely disappointed that the anastomosis is again broken down and she needs a colostomy, she would really like to wait until after the holiday season before planning surgery, especially since she struggled recovering last year. She is having some drainage of the vagina but it is not severe and it is manageable. She has some soreness but she does not have severely debilitating pelvic pain. She is not need narcotics around the clock and when she has works. She is ruling well on it though. She will keep her appointment with me next week to touch base. I will set up a time in the next couple weeks to do a laparoscopic assisted takedown anastomosis and colostomy. She needs more pain medications. I will ask my partner to fill an Rx. #40 ------ Notes Recorded by Michael Boston, Lindsay Little on 07/05/2016 at 6:52 AM EST Patient completed ordered test/studies. Results noted below. Patient with leak of contrast to vagina suspecting for delayed colovaginal fistula. Most likely at the apex. I think her rectum has failed her again. I think she requires resection and takedown of her distal  colorectal anastomosis with permanent colostomy. Despite the drains, surgeries, fecal diversion, omental pedicle flap and a normal colonoscopy and normal enema, the area has broken down again. I think that her neorectum refuses to adequately heal despite the numerous surgeries and interventions. I will call &  discuss with the patient Lindsay Little, M.D., F.A.C.S. Gastrointestinal and Minimally Invasive Surgery [facility] [address] [place], [place] [address] [phone] Main / Paging    [name] MRN: [id number] DOB/AGE: 1965/08/02 51 y.o.  Admit date: 05/31/2016 Discharge date: 06/03/2016  Patient Care Team: [name], Lindsay Little as PCP - General (Family Medicine) Michael Boston, Lindsay Little as Consulting Physician (General Surgery) Truitt Merle, Lindsay Little as Consulting Physician ([place]) Kyung Rudd, Lindsay Little as Consulting Physician (Radiation Oncology) Teena Irani, Lindsay Little as Consulting Physician (Gastroenterology) Raynelle Bring, Lindsay Little as Consulting Physician (Urology)  Admission Diagnoses: Principal Problem: Colorectal delayed anastomotic leak - healed, s/p ileostomy takedown 05/31/2016 Active Problems: Rectal adenocarcinoma s/p LAR resection 05/25/2015 Morbid obesity with BMI of 40.0-44.9, adult ([place]) Crohn's disease ([place]) Hypertension IBS (irritable bowel syndrome) Depression Pelvic abscess s/p drainage & omental pedicle flap 11/17/2015   Discharge Diagnoses: Principal Problem: Colorectal delayed anastomotic leak - healed, s/p ileostomy takedown 05/31/2016 Active Problems: Rectal adenocarcinoma s/p LAR resection 05/25/2015 Morbid obesity with BMI of 40.0-44.9, adult ([place]) Crohn's disease ([place]) Hypertension IBS (irritable bowel syndrome) Depression Pelvic abscess s/p drainage & omental pedicle flap 11/17/2015   POST-OPERATIVE DIAGNOSIS:  loop ileostomy in place for fecal diversion  SURGERY:  05/31/2016  Procedure(s): TAKEDOWN LOOP ILEOSTOMY  SURGEON:   Surgeon(s): Michael Boston, Lindsay Little  INDICATION: Pleasant patient status post low anterior resection for bulky rectal cancer. Developed collection that turned and abscess with delayed anastomotic leak and fistula. Eventually required fecal diversion. Had reoperation with omental pedicle flap. Biopsies disprove cancer recurrence. Anastomosis closed and healed. Colonoscopy two days ago showing omentum granulating at colorectal anastomosis otherwise no evidence of cancer recurrence or other cancers. The patient has recovered from that surgery with the anastomosis well-healed. It was felt safe to have the loop ileostomy taken down. I discussed the procedure with the patient:  The anatomy & physiology of the digestive tract was discussed. The pathophysiology was discussed. Possibility of remaining with an ostomy permanently was discussed. I offered ostomy takedown. Laparoscopic & open techniques were discussed.  Risks such as bleeding, infection, abscess, leak, reoperation, possible re-ostomy, injury to other organs, hernia, heart attack, death, and other risks were discussed. I noted a good likelihood this will help address the problem. Goals of post-operative recovery were discussed as well. We will work to minimize complications. Questions were answered. The patient expresses understanding & wishes to proceed with surgery.  OR FINDINGS: Thick abdominal wall with dense adhesions to the diverting loop ileostomy but no parastomal hernia. Normal anatomy.   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 Oral Surgery Shoulder Surgery Left.  Diagnostic Studies History Lindsay Hector, Lindsay Little; 10/24/2016 10:36 AM) Colonoscopy >10 years ago Mammogram 1-3 years ago Pap Smear 1-5 years ago  Allergies Lindsay Hector, Lindsay Little; 10/24/2016 10:36 AM) Allergies Reconciled Lidocaine *CHEMICALS* All of the Caine's except MARCAINE is ok per the pt Penicillin G Benzathine & Proc *PENICILLINS*  Sulfabenzamide *CHEMICALS*  Medication History Illene Regulus, CMA; 10/24/2016 10:31 AM) Medications Reconciled  Pregnancy / Birth History Lindsay Hector, Lindsay Little; 10/24/2016 10:36 AM) Age at menarche 13 years. Age of menopause 27-50 Gravida 4 Irregular periods Maternal age 90-20 Para 0  Other Problems Lindsay Hector, Lindsay Little; 10/24/2016 10:36 AM) Back Pain Diverticulosis Gastroesophageal Reflux Disease General anesthesia - complications High blood pressure Hypercholesterolemia Other disease, cancer, significant illness Rectal Cancer Transfusion history    Vitals (Alisha Spillers CMA; 10/24/2016 10:30 AM) 10/24/2016 10:29 AM Weight: 304.2 lb Height: 71in Body Surface Area: 2.52 m Body Mass Index: 42.43 kg/m  Pulse: 82 (Regular)  BP: 128/82 (Sitting, Left Arm, Standard)      Physical Exam Lindsay Hector Lindsay Little; 10/24/2016 2:45 PM)  General Mental Status-Alert. General Appearance-Not in acute distress. Voice-Normal. Note: Smiling. Move around easily. No nervous leg shaking  Integumentary Global Assessment Normal Exam - Distribution of scalp and body hair is normal. General  Characteristics Overall Skin Surface - no rashes and no suspicious lesions.  Head and Neck Head-normocephalic, atraumatic with no lesions or palpable masses. Face Global Assessment - atraumatic, no absence of expression. Neck Global Assessment - no abnormal movements, no decreased range of motion. Trachea-midline. Thyroid Gland Characteristics - non-tender.  Eye Eyeball - Left-Extraocular movements intact, No Nystagmus. Eyeball - Right-Extraocular movements intact, No Nystagmus. Upper Eyelid - Left-No Cyanotic. Upper Eyelid - Right-No Cyanotic. Note: Wears glasses. Vision acceptable  Chest and Lung Exam Inspection Accessory muscles - No use of accessory muscles in breathing. Note: R infraclavilcuar port clean without cellulitis  Abdomen Note: Morbidly obese but soft with clean panniculus. Mild rash in the pannicular fold. Pfannenstiel incision closed.   Left lower quadrant colostomy with intact bag and no leakage or drainage. Nice rosebud colostomy. One solitary 36m titanium staple removed from medial mucocutaneous junction  Other port site incisions with normal healing ridges. No guarding/rebound tenderness. Really no pain. No hernias. No drainage.  Female Genitourinary Note: Held off on exam since drainage tapering down & much improved  Rectal Note: Held off on exam since clinically much improved  Peripheral Vascular Upper Extremity Inspection - Left - Not Gangrenous, No Petechiae. Right - Not Gangrenous, No Petechiae.  Neurologic Neurologic evaluation reveals -normal attention span and ability to concentrate, able to name objects and repeat phrases. Appropriate fund of knowledge and normal coordination.  Neuropsychiatric Mental status exam performed with findings of-able to articulate well with normal speech/language, rate, volume and coherence and no evidence of hallucinations, delusions, obsessions or homicidal/suicidal  ideation. Orientation-oriented X3. Note: Laughing, joking. This is the best I have ever seen her.  Musculoskeletal Global Assessment Gait and Station - normal gait and station.  Lymphatic General Lymphatics Description - No Generalized lymphadenopathy.    Assessment & Plan (Lindsay HectorMD; 10/24/2016 2:45 PM)  CHRONIC PELVIC PAIN IN FEMALE (R10.2) Impression: Heart this is related to the chronic infections inflammation and needs more time.  Reasonable to evaluate with pelvic floor physical therapist. Would hesitate undergoing aggressive digital rectal examination.  Current Plans Pt Education - [facility] Pain [facility] (Finleigh Cheong) Continued Norco 5-325MG, 1-2 Tablet every six hours, as needed for pain, #30, 10/24/2016, No Refill. Pt Education - NCCSRS: chronic narcotic use: discussed with patient and provided information. PORT CATHETER IN PLACE ((Z00.923 Impression: Completing chemotherapy and IV fluid therapy. She wishes to have removed because it causes some neck discomfort. I think that is reasonable.  Given her anxiety other issues, she would like to try and do  this under some sedation. Struggled with some issues with interrupted procedures. I think that is reasonable. We will coordinate convenient time to do outpatient removal with some sedation.  Current Plans You are being scheduled for surgery- Our schedulers will call you.  You should hear from our office's scheduling department within 5 working days about the location, date, and time of surgery. We try to make accommodations for patient's preferences in scheduling surgery, but sometimes the OR schedule or the surgeon's schedule prevents [place] from making those accommodations.  If you have not heard from our office [phone]) in 5 working days, call the office and ask for your surgeon's nurse.  If you have other questions about your diagnosis, plan, or surgery, call the office and ask for your surgeon's nurse.  I  recommended surgery to remove the catheter. I explained the technique of removal with use of local anesthesia & possible need for more aggressive sedation/anesthesia for patient comfort.  Risks such as bleeding, infection, and other risks were discussed. Post-operative dressing/incision care was discussed. I noted a good likelihood this will help address the problem. We will work to minimize complications. Questions were answered. The patient expresses understanding & wishes to proceed with surgery.  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 No strong evidence of it intraoperatively.  Vaginal drainage but improiving. While she is still having intermittent pelvic pain, the episodes are short & less intense. Drainage has gradually been tapering down. That is reassuring. Hopefully should continue to heal. Will take 4-12 months  I think she needs more time to heal. I do not think she can tolerate prolonged sitting periods such as jury duty until vaginal drainage has stopped and pelvic wound is closed down. I would wait until after 12 Nov 2016, at the soonest.  Reasonable to start looking for work if she feels like she can tolerated. Focusing on regular exercise to regain stamina. Watch out for depression and other chronic issues  We'll do one more course of fluconazole to make sure there is no vaginal yeast infection involved.  Current Plans Started Fluconazole 200MG, 1 (one) Tablet daily, 3 days starting 10/24/2016, No Refill. S/P COLON RESECTION - 1st postop visit (Z90.49) Impression: Recovering. Postoperative seroma drained.  Pfannenstiel wound closing down. Continue packing. Closing down  RECTAL ADENOCARCINOMA (C20) Impression: Complicated recovery due to late abscess and contained anastomotic leak with some help of  percutaneous drainage and antibiotics.  No evidence of cancer recurrence.  She would benefit from post-adjuvant chemotherapy but obviously this must be delayed until the fistula is closed. Sounds like they're observing only at this point given the prolonged period between surgery and healing.  Continue follow-up appointment with her medical oncologist. Dr. Burr Medico just saw her. Planning CT scan evaluation in about 3 months.  COLOSTOMY IN PLACE (Z93.3)  Current Plans Pt Education - [facility] Ostomy HCI (Haillee Johann): discussed with patient and provided information.  Lindsay Little, M.D., F.A.C.S. Gastrointestinal and Minimally Invasive Surgery [facility] [address], [place], [place] [address] [phone] Main / Paging

## 2016-11-06 ENCOUNTER — Ambulatory Visit: Payer: No Typology Code available for payment source | Admitting: Physical Therapy

## 2016-11-13 ENCOUNTER — Ambulatory Visit: Payer: No Typology Code available for payment source | Attending: Surgery | Admitting: Physical Therapy

## 2016-11-13 DIAGNOSIS — M62838 Other muscle spasm: Secondary | ICD-10-CM

## 2016-11-13 DIAGNOSIS — Z483 Aftercare following surgery for neoplasm: Secondary | ICD-10-CM | POA: Diagnosis present

## 2016-11-13 DIAGNOSIS — M6281 Muscle weakness (generalized): Secondary | ICD-10-CM | POA: Diagnosis not present

## 2016-11-13 DIAGNOSIS — R279 Unspecified lack of coordination: Secondary | ICD-10-CM | POA: Insufficient documentation

## 2016-11-13 NOTE — Therapy (Signed)
[facility] [address], [place], [place], [address] Phone: [phone]   Fax:  [phone]  Physical Therapy Evaluation  Patient Details  Name: Lindsay Little MRN: [id number] Date of Birth: [birth year] Referring Provider: Dr. Michael Boston  Encounter Date: 11/13/2016      PT End of Session - 11/13/16 1413    Visit Number 1   Date for PT Re-Evaluation 01/13/17   Authorization Type [employer]   PT Start Time 1015   PT Stop Time 1100   PT Time Calculation (min) 45 min   Activity Tolerance Patient tolerated treatment well   Behavior During Therapy [facility] for tasks assessed/performed      Past Medical History:  Diagnosis Date  . Anemia   . Anxiety   . Bulging disc    L5- not a problem today 05-30-16.  . Cancer [facility])    rectal cancer- surgery, chemo last 1 yr ago, radiation.Dr. Angelia Mould. Lisbeth Renshaw.  . Complication of anesthesia   . Cystadenoma of right ovary s/p RSO 05/25/2015 06/28/2015   resovlevd with surgery  . Depression   . Fibroids    resolved with hysterectomy.  Marland Kitchen GERD (gastroesophageal reflux disease)   . Heart murmur   . History of blood transfusion   . History of chemotherapy   . History of frequent ear infections   . History of frequent urinary tract infections    middle school age- no recent issues  . History of hiatal hernia   . History of ileostomy   . History of mitral valve prolapse   . Hypercholesteremia   . Hypertension    B/P med stopped  2 months ago.  . IBS (irritable bowel syndrome)   . Large bowel delayed anastomotic leak s/p diverting loop ileostomy 11/17/2015 11/17/2015  . Mild sleep apnea    denies -was evaluated-did not have-no cpap ever used  . Numbness and tingling    great toe bilat left more than right , remains  . Obesity   . Occasional tremors   . Osteoarthritis, knee    left shoulder and left knee  . Pelvic abscess s/p drainage & omental pedicle flap 11/17/2015 06/23/2015  .  PONV (postoperative nausea and vomiting)    severe" needs Scopolamine PATCH" post op.  . Portacath in place    right chest  . Rectal bleeding   . S/P radiation therapy 02/20/15-03/30/15   colon/rectal  . Tear of left rotator cuff    surgery done but remains with some pain intermittent.  . TMJ (dislocation of temporomandibular joint)   . Vertigo   . Vitamin D deficiency   . Wears glasses     Past Surgical History:  Procedure Laterality Date  . ABDOMINAL HYSTERECTOMY  1996   Removed uterus, and tubes  . COLON RESECTION N/A 07/12/2016   Procedure: LAPAROSCOPIC LYSIS OF ADHESIONS, OMENTOPEXY, HAND ASSISTED RESECTION OF  COLON, END TO END ANASTOMOSIS, COLOSTOMY;  Surgeon: Michael Boston, MD;  Location: [facility];  Service: General;  Laterality: N/A;  . DIAGNOSTIC LAPAROSCOPY     11'16 right oophorectomy  . DILATION AND CURETTAGE OF UTERUS     times 2  . EUS N/A 01/18/2015   Procedure: LOWER ENDOSCOPIC ULTRASOUND (EUS);  Surgeon: Arta Silence, MD;  Location: [facility];  Service: Endoscopy;  Laterality: N/A;  . EXCISION OF SKIN TAG  11/17/2015   Procedure: EXCISION OF SKIN TAG;  Surgeon: Michael Boston, MD;  Location: [facility];  Service: General;;  . ILEO LOOP  COLOSTOMY CLOSURE N/A 11/17/2015   Procedure: LAPAROSCOPIC DIVERTING LOOP ILEOSTOMY  DRAINAGE OF PELVIC ABSCESS;  Surgeon: Michael Boston, MD;  Location: [facility];  Service: General;  Laterality: N/A;  . ILEOSTOMY CLOSURE N/A 05/31/2016   Procedure: TAKEDOWN LOOP ILEOSTOMY;  Surgeon: Michael Boston, MD;  Location: [facility];  Service: General;  Laterality: N/A;  . IMPACTION REMOVAL  11/17/2015   Procedure: DISIMPACTION REMOVAL;  Surgeon: Michael Boston, MD;  Location: [facility];  Service: General;;  . JP drain     . LAPAROSCOPIC LYSIS OF ADHESIONS  11/17/2015   Procedure: LAPAROSCOPIC LYSIS OF ADHESIONS;  Surgeon: Michael Boston, MD;  Location: [facility];  Service: General;;  . left knee arthroscopy     mult  . PORTACATH PLACEMENT N/A 05/25/2015   Procedure:  INSERTION PORT-A-CATH;  Surgeon: Michael Boston, MD;  Location: [facility];  Service: General;  Laterality: N/A;-remains inplace Right chest.  . ROTATOR CUFF REPAIR Left   . TONSILLECTOMY    . XI ROBOTIC ASSISTED LOWER ANTERIOR RESECTION N/A 05/25/2015   Procedure: XI ROBOTIC ASSISTED LOWER ANTERIOR RESECTION, , RIGID PROCTOSCOPY, RIGHT OOPHORECTOMY;  Surgeon: Michael Boston, MD;  Location: [facility];  Service: General;  Laterality: N/A;    There were no vitals filed for this visit.       Subjective Assessment - 11/13/16 1016    Subjective Patient is having vaginally that is constant. Every two weeks has increased pain and more leakage. Initial diagnosis on 01/13/2015. Radiation and chemotherapy from8/8 to 03/30/2015. 05/25/2015 had recto-sigmoid semental resection. 12/9 to 06/29/15 had pelvic abcess. 07/04/2016 showed a fistula with CAT scan and on 07/12/2016 had a cholostomy.    Patient Stated Goals stop drainage, decrease pelvic pain   Currently in Pain? Yes   Pain Score 5   worst every 2 weeks is 10/10 for 4 days   Pain Location Abdomen   Pain Orientation Lower   Pain Descriptors / Indicators Stabbing;Sharp;Penetrating   Pain Type Chronic pain   Pain Onset More than a month ago  05/25/2015 after first surgery   Pain Frequency Constant  3-4/10 constant   Aggravating Factors  Increased movement; end of urination with increased pressure and pain   Pain Relieving Factors after urinating   Multiple Pain Sites No            [facility] PT Assessment - 11/13/16 0001      Assessment   Medical Diagnosis R10.2 Chronic Pelvic Pain in Female   Referring Provider Dr. Michael Boston   Onset Date/Surgical Date 05/25/15   Prior Therapy None     Precautions   Precautions Other (comment)   Precaution Comments cancer     Restrictions   Weight Bearing Restrictions No     Balance Screen   Has the patient fallen in the past 6 months No   Has the patient had a decrease in activity level because of a fear  of falling?  No   Is the patient reluctant to leave their home because of a fear of falling?  No     Home Environment   Living Environment Private residence   Type of [place] to enter   [place] One level     Prior Function   Level of Independence Independent   Vocation On disability   Vocation Requirements get back to [employer]-vitals, standing on feet,    Leisure walking     Cognition   Overall Cognitive Status Within Functional Limits for tasks  assessed     Observation/Other Assessments   Observations scars are still red, healed and decreased mobility on sacrum and abdomen superiorly and inferiorly   Focus on Therapeutic Outcomes (FOTO)  79% limitation for PFDI  Pain survey     Posture/Postural Control   Posture/Postural Control Postural limitations   Postural Limitations Rounded Shoulders;Forward head     ROM / Strength   AROM / PROM / Strength AROM;PROM;Strength     AROM   Overall AROM  Within functional limits for tasks performed     PROM   Overall PROM  Within functional limits for tasks performed     Strength   Overall Strength Comments abdominal strength is 2/5   Right Hip Extension 4-/5   Right Hip ABduction 4-/5   Right Hip ADduction 4-/5   Left Hip ABduction 4-/5     Palpation   SI assessment  right ilium is anteriorly rotated   Palpation comment tenderness located in right upper abdomen, arond the scars on the rectum, along the sacrum, bil. gluteal     Transfers   Transfers Independent with all Transfers  has some difficulty due to abdominal weakness     Ambulation/Gait   Ambulation/Gait No     Balance   Balance Assessed --  has to place foot behind calf to balance on one leg                 Pelvic Floor Special Questions - 11/13/16 0001    Prior Pregnancies No   Currently Sexually Active No  wants to be, has not since cancer diagnosis   Urinary Leakage Yes   Pad use 2 dpends   Activities that cause leaking  Sneezing;Coughing   Fecal incontinence No  has liquid coming out of the rectum that is on the pad   External Perineal Exam discharge on the vulva area there was discharge coming from the rectal area   Skin Integrity Intact   External Palpation tenderness located in bil. bulbocavernosus and ischiocavernosus   Pelvic Floor Internal Exam Patient confirms identification and approves therapist to assess the perineal area   Exam Type Vaginal   Palpation tenderness located in bil. urethra sphincter, bil. sides of introitus, and puborectalis, did not go fully into the vaginal canal  unable to bulge pelvic floor   Strength weak squeeze, no lift                  PT Education - 11/13/16 1413    Education provided No          PT Short Term Goals - 11/13/16 1432      PT SHORT TERM GOAL #1   Title independent with initial HEP   Time 1   Period Months   Status New     PT SHORT TERM GOAL #2   Title understand if the constant leakage is  coming out of the rectum or vaginal   Time 1   Period Months   Status New     PT SHORT TERM GOAL #3   Title lower abdominal pain with daily tasks decreased >/= 25%   Time 1   Period Months   Status New     PT SHORT TERM GOAL #4   Title urinary leakage with coughing and sneezing decreased >/= 25%   Time 1   Period Months   Status New     PT SHORT TERM GOAL #5   Title pressure and pain during urination decreased >/=  25%   Time 1   Period Months   Status New           PT Long Term Goals - 11/13/16 1435      PT LONG TERM GOAL #1   Title independent with HEP and understand how to exercise at the pool   Time 2   Period Months   Status New     PT LONG TERM GOAL #2   Title pressure and pain with urination decreased >/= 75% due to increased mobility of tissue   Time 2   Period Months   Status New     PT LONG TERM GOAL #3   Title urinary leakage with sneezing and coughing decreased >/= 75% due to increased pelvic floor strength  >/= 4/5   Time 2   Period Months   Status New     PT LONG TERM GOAL #4   Title lower abdominal pain with increased activity decreased >/= 75% due to improved tissue mobility   Time 2   Period Months   Status New     PT LONG TERM GOAL #5   Title FOTO score </= 50%   Time 2   Period Months   Status New               Plan - 11/13/16 1414    Clinical Impression Statement Patient is a 50 year old female with chronic pelvic pain since she had her recto-sigmoid segmental resection on 05/25/2015.  Patient was initially diagnosed with rectal adenocarcinoma on 01/13/2015.  Patient recieved radiation with chemotherapy from 02/20/2015 to 03/30/2015.  Patient was in the hospital from 06/23/2015 t o12/15/2016 for pelvic abcess.  Patient ileostomy was taken down on 05/31/2016 and colostomy bag was placed on 07/12/2016.  Patient surgical site on lower abdomen has healed several weeks ago after she had an infection.  Patient has decreased mobility of healed surgical site on abdomen and sacral area.  Patient has drainage coming out of the anus. Patient pelvic floor strength is 2/5 with no lift and tightness located on bilateral urethra sphincter and sides of introitus.  Patient has palpable tenderness located on puborectalis and bilateral sides of bladder.  Abdominal strength is 2/5 and weakness in right hip and left hip abduction.  Patient is obese and has hanging skin on the abdomen from significant weightloss that hangs over the lower abdominal scar.  Patient reports urinary leakage with coughing and sneezing.  Patient wears 2 depends pads per day.  Patient has not had intercourse but would like to in the future with her husband.  Patient reports constant pain at level 5/10 in lower abdomen and when sitting that will get to 10/10 every two weeks. Pain is worse with movement, sitting. ient is moderately complex patient with evolving condition and comorbidities that were stated earlier that can impact care  provided.    Rehab Potential Good   Clinical Impairments Affecting Rehab Potential s/p recta adenocarcinoma; radiation and chemotherapy 02/20/2015 to 03/30/2015; 05/25/2015 recto-sigmoid segmental resection; 05/31/2016 ileostomy take down; 07/12/2016 segmental resection of proimal and distal colon for a colovaginal fistula   PT Frequency 2x / week   PT Duration 8 weeks   PT Treatment/Interventions Biofeedback;Patient/family education;Scar mobilization;Manual techniques;Manual lymph drainage;Neuromuscular re-education;Balance training;Therapeutic exercise;Therapeutic activities;Energy conservation   PT Next Visit Plan soft tissue work to abdomen, along scars, work on abdominal contraction; internal soft tissue work   PT [place] progress as needed   Recommended Other Services None  Consulted and Agree with Plan of Care Patient      Patient will benefit from skilled therapeutic intervention in order to improve the following deficits and impairments:  Decreased range of motion, Increased fascial restricitons, Increased muscle spasms, Pain, Decreased activity tolerance, Decreased balance, Decreased scar mobility, Impaired flexibility, Decreased strength, Decreased mobility  Visit Diagnosis: Muscle weakness (generalized) - Plan: PT plan of care cert/re-cert  Unspecified lack of coordination - Plan: PT plan of care cert/re-cert  Other muscle spasm - Plan: PT plan of care cert/re-cert  Aftercare following surgery for neoplasm - Plan: PT plan of care cert/re-cert     Problem List Patient Active Problem List   Diagnosis Date Noted  . Colostomy in place [facility]) 07/12/2016  . Colovaginal fistula s/p omentopexy repair 07/12/2016 07/10/2016  . Rectocutaneous fistula 07/10/2016  . Gastroenteritis 12/20/2015  . Hydroureter on right 12/20/2015  . Abdominal pain 12/19/2015  . Hypokalemia 11/23/2015  . Colorectal delayed anastomotic leak s/p resection & colostomy 07/12/2016 11/17/2015  .  Pelvic abscess s/p drainage & omental pedicle flap 11/17/2015 06/23/2015  . Anemia of chronic disease 06/14/2015  . Hypertension   . IBS (irritable bowel syndrome)   . Depression   . Cavernous hemangioma of liver - segments 5 & 7 02/07/2015  . Osteoarthritis of left knee 02/07/2015  . Crohn's disease ([place]) 02/07/2015  . Rectal adenocarcinoma s/p LAR resection 05/25/2015 01/31/2015  . Pure hypercholesterolemia 02/21/2014  . Morbid obesity with BMI of 40.0-44.9, adult ([place]) 02/21/2014  . Family history of ischemic heart disease 02/21/2014    Earlie Counts, PT 11/13/16 2:39 PM   [facility] [address], [place], [place], [address] Phone: [phone]   Fax:  [phone]  Name: LEIGHANNE ADOLPH MRN: [id number] Date of Birth: Jun 08, 1966

## 2016-11-15 ENCOUNTER — Ambulatory Visit: Payer: No Typology Code available for payment source | Admitting: Physical Therapy

## 2016-11-15 ENCOUNTER — Encounter: Payer: Self-pay | Admitting: Physical Therapy

## 2016-11-15 DIAGNOSIS — M6281 Muscle weakness (generalized): Secondary | ICD-10-CM | POA: Diagnosis not present

## 2016-11-15 DIAGNOSIS — Z483 Aftercare following surgery for neoplasm: Secondary | ICD-10-CM

## 2016-11-15 DIAGNOSIS — R279 Unspecified lack of coordination: Secondary | ICD-10-CM

## 2016-11-15 DIAGNOSIS — M62838 Other muscle spasm: Secondary | ICD-10-CM

## 2016-11-15 NOTE — Therapy (Signed)
[facility] [address] [place], [place], [address] Phone: [phone]   Fax:  [phone]  Physical Therapy Treatment  Patient Details  Name: Lindsay Little MRN: [id number] Date of Birth: 12-27-1965 Referring Provider: Dr. Michael Boston  Encounter Date: 11/15/2016      PT End of Session - 11/15/16 1130    Visit Number 2   Date for PT Re-Evaluation 01/13/17   Authorization Type [employer]   PT Start Time 1015   PT Stop Time 1100   PT Time Calculation (min) 45 min   Activity Tolerance Patient tolerated treatment well   Behavior During Therapy [facility] for tasks assessed/performed      Past Medical History:  Diagnosis Date  . Anemia   . Anxiety   . Bulging disc    L5- not a problem today 05-30-16.  . Cancer [facility])    rectal cancer- surgery, chemo last 1 yr ago, radiation.Dr. Angelia Mould. Lisbeth Renshaw.  . Complication of anesthesia   . Cystadenoma of right ovary s/p RSO 05/25/2015 06/28/2015   resovlevd with surgery  . Depression   . Fibroids    resolved with hysterectomy.  Marland Kitchen GERD (gastroesophageal reflux disease)   . Heart murmur   . History of blood transfusion   . History of chemotherapy   . History of frequent ear infections   . History of frequent urinary tract infections    middle school age- no recent issues  . History of hiatal hernia   . History of ileostomy   . History of mitral valve prolapse   . Hypercholesteremia   . Hypertension    B/P med stopped  2 months ago.  . IBS (irritable bowel syndrome)   . Large bowel delayed anastomotic leak s/p diverting loop ileostomy 11/17/2015 11/17/2015  . Mild sleep apnea    denies -was evaluated-did not have-no cpap ever used  . Numbness and tingling    great toe bilat left more than right , remains  . Obesity   . Occasional tremors   . Osteoarthritis, knee    left shoulder and left knee  . Pelvic abscess s/p drainage & omental pedicle flap 11/17/2015 06/23/2015  .  PONV (postoperative nausea and vomiting)    severe" needs Scopolamine PATCH" post op.  . Portacath in place    right chest  . Rectal bleeding   . S/P radiation therapy 02/20/15-03/30/15   colon/rectal  . Tear of left rotator cuff    surgery done but remains with some pain intermittent.  . TMJ (dislocation of temporomandibular joint)   . Vertigo   . Vitamin D deficiency   . Wears glasses     Past Surgical History:  Procedure Laterality Date  . ABDOMINAL HYSTERECTOMY  1996   Removed uterus, and tubes  . COLON RESECTION N/A 07/12/2016   Procedure: LAPAROSCOPIC LYSIS OF ADHESIONS, OMENTOPEXY, HAND ASSISTED RESECTION OF  COLON, END TO END ANASTOMOSIS, COLOSTOMY;  Surgeon: Michael Boston, MD;  Location: [facility];  Service: General;  Laterality: N/A;  . DIAGNOSTIC LAPAROSCOPY     11'16 right oophorectomy  . DILATION AND CURETTAGE OF UTERUS     times 2  . EUS N/A 01/18/2015   Procedure: LOWER ENDOSCOPIC ULTRASOUND (EUS);  Surgeon: Arta Silence, MD;  Location: [facility];  Service: Endoscopy;  Laterality: N/A;  . EXCISION OF SKIN TAG  11/17/2015   Procedure: EXCISION OF SKIN TAG;  Surgeon: Michael Boston, MD;  Location: [facility];  Service: General;;  . ILEO LOOP  COLOSTOMY CLOSURE N/A 11/17/2015   Procedure: LAPAROSCOPIC DIVERTING LOOP ILEOSTOMY  DRAINAGE OF PELVIC ABSCESS;  Surgeon: Michael Boston, MD;  Location: [facility];  Service: General;  Laterality: N/A;  . ILEOSTOMY CLOSURE N/A 05/31/2016   Procedure: TAKEDOWN LOOP ILEOSTOMY;  Surgeon: Michael Boston, MD;  Location: [facility];  Service: General;  Laterality: N/A;  . IMPACTION REMOVAL  11/17/2015   Procedure: DISIMPACTION REMOVAL;  Surgeon: Michael Boston, MD;  Location: [facility];  Service: General;;  . JP drain     . LAPAROSCOPIC LYSIS OF ADHESIONS  11/17/2015   Procedure: LAPAROSCOPIC LYSIS OF ADHESIONS;  Surgeon: Michael Boston, MD;  Location: [facility];  Service: General;;  . left knee arthroscopy     mult  . PORTACATH PLACEMENT N/A 05/25/2015   Procedure:  INSERTION PORT-A-CATH;  Surgeon: Michael Boston, MD;  Location: [facility];  Service: General;  Laterality: N/A;-remains inplace Right chest.  . ROTATOR CUFF REPAIR Left   . TONSILLECTOMY    . XI ROBOTIC ASSISTED LOWER ANTERIOR RESECTION N/A 05/25/2015   Procedure: XI ROBOTIC ASSISTED LOWER ANTERIOR RESECTION, , RIGID PROCTOSCOPY, RIGHT OOPHORECTOMY;  Surgeon: Michael Boston, MD;  Location: [facility];  Service: General;  Laterality: N/A;    There were no vitals filed for this visit.      Subjective Assessment - 11/15/16 1017    Subjective Now is the time when I get alot of the cramping.    Patient Stated Goals stop drainage, decrease pelvic pain   Currently in Pain? Yes   Pain Score 6    Pain Location Abdomen   Pain Orientation Lower   Pain Descriptors / Indicators Stabbing;Sharp;Penetrating   Pain Type Chronic pain   Pain Onset More than a month ago  05/24/2017 after surgery   Pain Frequency Constant   Aggravating Factors  increased movement; end of urination with increased pressure and pain   Pain Relieving Factors after urinating   Multiple Pain Sites No                         [facility] Adult PT Treatment/Exercise - 11/15/16 0001      Neuro Re-ed    Neuro Re-ed Details  abdominal bracing with lower and abdomials contracting, and not holding her breath     Manual Therapy   Manual Therapy Soft tissue mobilization;Myofascial release   Soft tissue mobilization to abdominal muscles, along lower abdominal scar but not on it; trigger point release on the abdomen   Myofascial Release release of the urogenital diaphgram                PT Education - 11/15/16 1058    Education provided Yes   Education Details abdominal contraction; abdominal massage   Person(s) Educated Patient   Methods Explanation;Demonstration;Verbal cues;Handout   Comprehension Returned demonstration;Verbalized understanding          PT Short Term Goals - 11/13/16 1432      PT SHORT TERM  GOAL #1   Title independent with initial HEP   Time 1   Period Months   Status New     PT SHORT TERM GOAL #2   Title understand if the constant leakage is  coming out of the rectum or vaginal   Time 1   Period Months   Status New     PT SHORT TERM GOAL #3   Title lower abdominal pain with daily tasks decreased >/= 25%   Time 1   Period Months  Status New     PT SHORT TERM GOAL #4   Title urinary leakage with coughing and sneezing decreased >/= 25%   Time 1   Period Months   Status New     PT SHORT TERM GOAL #5   Title pressure and pain during urination decreased >/= 25%   Time 1   Period Months   Status New           PT Long Term Goals - 11/13/16 1435      PT LONG TERM GOAL #1   Title independent with HEP and understand how to exercise at the pool   Time 2   Period Months   Status New     PT LONG TERM GOAL #2   Title pressure and pain with urination decreased >/= 75% due to increased mobility of tissue   Time 2   Period Months   Status New     PT LONG TERM GOAL #3   Title urinary leakage with sneezing and coughing decreased >/= 75% due to increased pelvic floor strength >/= 4/5   Time 2   Period Months   Status New     PT LONG TERM GOAL #4   Title lower abdominal pain with increased activity decreased >/= 75% due to improved tissue mobility   Time 2   Period Months   Status New     PT LONG TERM GOAL #5   Title FOTO score </= 50%   Time 2   Period Months   Status New               Plan - 11/15/16 1131    Clinical Impression Statement Patient has many trigger points in her abdominal area.  After therapy, patient reports her lower abdominal pain has decreased.  Patient was able to contract her abdomen correctly.  Patient will benefit from skilled therapy  to reduce pain and muscle coordination.    Rehab Potential Good   Clinical Impairments Affecting Rehab Potential s/p recta adenocarcinoma; radiation and chemotherapy 02/20/2015 to 03/30/2015;  05/25/2015 recto-sigmoid segmental resection; 05/31/2016 ileostomy take down; 07/12/2016 segmental resection of proimal and distal colon for a colovaginal fistula   PT Frequency 2x / week   PT Duration 8 weeks   PT Treatment/Interventions Biofeedback;Patient/family education;Scar mobilization;Manual techniques;Manual lymph drainage;Neuromuscular re-education;Balance training;Therapeutic exercise;Therapeutic activities;Energy conservation   PT Next Visit Plan soft tissue work to abdomen, along scars, work on abdominal contraction; internal soft tissue work   PT [place] progress as needed   Consulted and Agree with Plan of Care Patient      Patient will benefit from skilled therapeutic intervention in order to improve the following deficits and impairments:  Decreased range of motion, Increased fascial restricitons, Increased muscle spasms, Pain, Decreased activity tolerance, Decreased balance, Decreased scar mobility, Impaired flexibility, Decreased strength, Decreased mobility  Visit Diagnosis: Muscle weakness (generalized)  Unspecified lack of coordination  Other muscle spasm  Aftercare following surgery for neoplasm     Problem List Patient Active Problem List   Diagnosis Date Noted  . Colostomy in place [facility]) 07/12/2016  . Colovaginal fistula s/p omentopexy repair 07/12/2016 07/10/2016  . Rectocutaneous fistula 07/10/2016  . Gastroenteritis 12/20/2015  . Hydroureter on right 12/20/2015  . Abdominal pain 12/19/2015  . Hypokalemia 11/23/2015  . Colorectal delayed anastomotic leak s/p resection & colostomy 07/12/2016 11/17/2015  . Pelvic abscess s/p drainage & omental pedicle flap 11/17/2015 06/23/2015  . Anemia of chronic disease 06/14/2015  . Hypertension   .  IBS (irritable bowel syndrome)   . Depression   . Cavernous hemangioma of liver - segments 5 & 7 02/07/2015  . Osteoarthritis of left knee 02/07/2015  . Crohn's disease ([place]) 02/07/2015  . Rectal  adenocarcinoma s/p LAR resection 05/25/2015 01/31/2015  . Pure hypercholesterolemia 02/21/2014  . Morbid obesity with BMI of 40.0-44.9, adult ([place]) 02/21/2014  . Family history of ischemic heart disease 02/21/2014    Earlie Counts, PT 11/15/16 11:34 AM   [facility] [address], [place], [place], [address] Phone: [phone]   Fax:  [phone]  Name: Lindsay Little MRN: [id number] Date of Birth: Jan 29, 1966

## 2016-11-15 NOTE — Patient Instructions (Signed)
Lower abdominal/core stability exercises  1. Practice your breathing technique: Inhale through your nose expanding your belly and rib cage. Try not to breathe into your chest. Exhale slowly and gradually out your mouth feeling a sense of softness to your body. Practice multiple times. This can be performed unlimited.  2. Finding the lower abdominals. Laying on your back with the knees bent, place your fingers just below your belly button. Using your breathing technique from above, on your exhale gently pull the belly button away from your fingertips without tensing any other muscles. Practice this 5x. Next, as you exhale, draw belly button inwards and hold onto it...then feel as if you are pulling that muscle across your pelvis like you are tightening a belt. This can be hard to do at first so be patient and practice. Do 5-10 reps 1-3 x day. Always recognize quality over quantity; if your abdominal muscles become tired you will notice you may tighten/contract other muscles. This is the time to take a break.   Practice this first laying on your back, then in sitting, progressing to standing and finally adding it to all your daily movements.   About Abdominal Massage  Abdominal massage, also called external colon massage, is a self-treatment circular massage technique that can reduce and eliminate gas and ease constipation. The colon naturally contracts in waves in a clockwise direction starting from inside the right hip, moving up toward the ribs, across the belly, and down inside the left hip.  When you perform circular abdominal massage, you help stimulate your colon's normal wave pattern of movement called peristalsis.  It is most beneficial when done after eating.  Positioning You can practice abdominal massage with oil while lying down, or in the shower with soap.  Some people find that it is just as effective to do the massage through clothing while sitting or standing.  How to Massage Start by  placing your finger tips or knuckles on your right side, just inside your hip bone.  . Make small circular movements while you move upward toward your rib cage.   . Once you reach the bottom right side of your rib cage, take your circular movements across to the left side of the bottom of your rib cage.  . Next, move downward until you reach the inside of your left hip bone.  This is the path your feces travel in your colon. . Continue to perform your abdominal massage in this pattern for 10 minutes each day.     You can apply as much pressure as is comfortable in your massage.  Start gently and build pressure as you continue to practice.  Notice any areas of pain as you massage; areas of slight pain may be relieved as you massage, but if you have areas of significant or intense pain, consult with your healthcare provider.  Other Considerations . General physical activity including bending and stretching can have a beneficial massage-like effect on the colon.  Deep breathing can also stimulate the colon because breathing deeply activates the same nervous system that supplies the colon.   . Abdominal massage should always be used in combination with a bowel-conscious diet that is high in the proper type of fiber for you, fluids (primarily water), and a regular exercise program. [facility] [address], [place], [place] [address] Phone # [phone] Fax [phone]

## 2016-11-19 ENCOUNTER — Encounter (HOSPITAL_BASED_OUTPATIENT_CLINIC_OR_DEPARTMENT_OTHER): Payer: Self-pay | Admitting: *Deleted

## 2016-11-19 ENCOUNTER — Ambulatory Visit: Payer: No Typology Code available for payment source | Admitting: Physical Therapy

## 2016-11-19 ENCOUNTER — Encounter: Payer: Self-pay | Admitting: Physical Therapy

## 2016-11-19 DIAGNOSIS — R279 Unspecified lack of coordination: Secondary | ICD-10-CM

## 2016-11-19 DIAGNOSIS — M6281 Muscle weakness (generalized): Secondary | ICD-10-CM | POA: Diagnosis not present

## 2016-11-19 DIAGNOSIS — Z483 Aftercare following surgery for neoplasm: Secondary | ICD-10-CM

## 2016-11-19 DIAGNOSIS — M62838 Other muscle spasm: Secondary | ICD-10-CM

## 2016-11-19 NOTE — Therapy (Signed)
[facility] [address] [place], [place], [address] Phone: [phone]   Fax:  [phone]  Physical Therapy Treatment  Patient Details  Name: Lindsay Little MRN: [id number] Date of Birth: 02/02/1966 Referring Provider: Dr. Michael Boston  Encounter Date: 11/19/2016      PT End of Session - 11/19/16 1232    Visit Number 3   Date for PT Re-Evaluation 01/13/17   Authorization Type [employer]   PT Start Time 1145   PT Stop Time 1226   PT Time Calculation (min) 41 min   Activity Tolerance Patient tolerated treatment well   Behavior During Therapy [facility] for tasks assessed/performed      Past Medical History:  Diagnosis Date  . Anemia   . Anxiety   . Bulging disc    L5- not a problem today 05-30-16.  . Cancer [facility])    rectal cancer- surgery, chemo last 1 yr ago, radiation.Dr. Angelia Mould. Lisbeth Renshaw.  . Complication of anesthesia   . Cystadenoma of right ovary s/p RSO 05/25/2015 06/28/2015   resovlevd with surgery  . Depression   . Fibroids    resolved with hysterectomy.  Marland Kitchen GERD (gastroesophageal reflux disease)   . Heart murmur   . History of blood transfusion   . History of chemotherapy   . History of frequent ear infections   . History of frequent urinary tract infections    middle school age- no recent issues  . History of hiatal hernia   . History of ileostomy   . History of mitral valve prolapse   . Hypercholesteremia   . Hypertension    B/P med stopped  2 months ago.  . IBS (irritable bowel syndrome)   . Large bowel delayed anastomotic leak s/p diverting loop ileostomy 11/17/2015 11/17/2015  . Mild sleep apnea    denies -was evaluated-did not have-no cpap ever used  . Numbness and tingling    great toe bilat left more than right , remains  . Obesity   . Occasional tremors   . Osteoarthritis, knee    left shoulder and left knee  . Pelvic abscess s/p drainage & omental pedicle flap 11/17/2015 06/23/2015  .  PONV (postoperative nausea and vomiting)    severe" needs Scopolamine PATCH" post op.  . Portacath in place    right chest  . Rectal bleeding   . S/P radiation therapy 02/20/15-03/30/15   colon/rectal  . Tear of left rotator cuff    surgery done but remains with some pain intermittent.  . TMJ (dislocation of temporomandibular joint)   . Vertigo   . Vitamin D deficiency   . Wears glasses     Past Surgical History:  Procedure Laterality Date  . ABDOMINAL HYSTERECTOMY  1996   Removed uterus, and tubes  . COLON RESECTION N/A 07/12/2016   Procedure: LAPAROSCOPIC LYSIS OF ADHESIONS, OMENTOPEXY, HAND ASSISTED RESECTION OF  COLON, END TO END ANASTOMOSIS, COLOSTOMY;  Surgeon: Michael Boston, MD;  Location: [facility];  Service: General;  Laterality: N/A;  . DIAGNOSTIC LAPAROSCOPY     11'16 right oophorectomy  . DILATION AND CURETTAGE OF UTERUS     times 2  . EUS N/A 01/18/2015   Procedure: LOWER ENDOSCOPIC ULTRASOUND (EUS);  Surgeon: Arta Silence, MD;  Location: [facility];  Service: Endoscopy;  Laterality: N/A;  . EXCISION OF SKIN TAG  11/17/2015   Procedure: EXCISION OF SKIN TAG;  Surgeon: Michael Boston, MD;  Location: [facility];  Service: General;;  . ILEO LOOP  COLOSTOMY CLOSURE N/A 11/17/2015   Procedure: LAPAROSCOPIC DIVERTING LOOP ILEOSTOMY  DRAINAGE OF PELVIC ABSCESS;  Surgeon: Michael Boston, MD;  Location: [facility];  Service: General;  Laterality: N/A;  . ILEOSTOMY CLOSURE N/A 05/31/2016   Procedure: TAKEDOWN LOOP ILEOSTOMY;  Surgeon: Michael Boston, MD;  Location: [facility];  Service: General;  Laterality: N/A;  . IMPACTION REMOVAL  11/17/2015   Procedure: DISIMPACTION REMOVAL;  Surgeon: Michael Boston, MD;  Location: [facility];  Service: General;;  . JP drain     . LAPAROSCOPIC LYSIS OF ADHESIONS  11/17/2015   Procedure: LAPAROSCOPIC LYSIS OF ADHESIONS;  Surgeon: Michael Boston, MD;  Location: [facility];  Service: General;;  . left knee arthroscopy     mult  . PORTACATH PLACEMENT N/A 05/25/2015   Procedure:  INSERTION PORT-A-CATH;  Surgeon: Michael Boston, MD;  Location: [facility];  Service: General;  Laterality: N/A;-remains inplace Right chest.  . ROTATOR CUFF REPAIR Left   . TONSILLECTOMY    . XI ROBOTIC ASSISTED LOWER ANTERIOR RESECTION N/A 05/25/2015   Procedure: XI ROBOTIC ASSISTED LOWER ANTERIOR RESECTION, , RIGID PROCTOSCOPY, RIGHT OOPHORECTOMY;  Surgeon: Michael Boston, MD;  Location: [facility];  Service: General;  Laterality: N/A;    There were no vitals filed for this visit.      Subjective Assessment - 11/19/16 1152    Subjective I had no pain for 2 days after therapy.  The drainage is coming from my rectum.  I took a pain pill yesterday and have not taken one in a week.    Patient Stated Goals stop drainage, decrease pelvic pain   Currently in Pain? Yes   Pain Score 4    Pain Location Abdomen   Pain Orientation Lower   Pain Descriptors / Indicators Stabbing;Sharp;Penetrating   Pain Type Chronic pain   Pain Onset More than a month ago   Pain Frequency Constant   Aggravating Factors  increased movement; end of urination with increased pressure and pain.    Pain Relieving Factors after urinating                      Pelvic Floor Special Questions - 11/19/16 0001    Pelvic Floor Internal Exam Patient confirms identification and approves therapist to assess the perineal area   Exam Type Vaginal   Strength weak squeeze, no lift           [facility] Adult PT Treatment/Exercise - 11/19/16 0001      Neuro Re-ed    Neuro Re-ed Details  pelvic floor contraction with tactile cues to stimulate the correct contraction      Exercises   Exercises Lumbar     Lumbar Exercises: Aerobic   Stationary Bike nustep 6 min arms #10 seat #9     Manual Therapy   Manual Therapy Internal Pelvic Floor   Internal Pelvic Floor bil. urethra sphincter, around the introitus, bil. obturator internist, and levator ani                PT Education - 11/19/16 5009    Education provided  Yes   Education Details hip strength   Person(s) Educated Patient   Methods Explanation;Demonstration;Verbal cues;Handout   Comprehension Verbalized understanding;Returned demonstration          PT Short Term Goals - 11/19/16 1235      PT SHORT TERM GOAL #1   Title independent with initial HEP   Time 1   Period Months   Status On-going  learning  PT SHORT TERM GOAL #2   Title understand if the constant leakage is  coming out of the rectum or vaginal   Period Months   Status Achieved  from the rectum     PT SHORT TERM GOAL #3   Title lower abdominal pain with daily tasks decreased >/= 25%   Time 1   Period Months   Status On-going  relief for 2 days     PT SHORT TERM GOAL #4   Title urinary leakage with coughing and sneezing decreased >/= 25%   Time 1   Period Months   Status On-going  able to do a circular contraction     PT SHORT TERM GOAL #5   Title pressure and pain during urination decreased >/= 25%   Time 1   Period Months   Status On-going           PT Long Term Goals - 11/13/16 1435      PT LONG TERM GOAL #1   Title independent with HEP and understand how to exercise at the pool   Time 2   Period Months   Status New     PT LONG TERM GOAL #2   Title pressure and pain with urination decreased >/= 75% due to increased mobility of tissue   Time 2   Period Months   Status New     PT LONG TERM GOAL #3   Title urinary leakage with sneezing and coughing decreased >/= 75% due to increased pelvic floor strength >/= 4/5   Time 2   Period Months   Status New     PT LONG TERM GOAL #4   Title lower abdominal pain with increased activity decreased >/= 75% due to improved tissue mobility   Time 2   Period Months   Status New     PT LONG TERM GOAL #5   Title FOTO score </= 50%   Time 2   Period Months   Status New               Plan - 11/19/16 1233    Clinical Impression Statement Patient was able to do a circular contraction after  tactile cues to the pelvic floor muscles.  Patient needed verbal cues to perform hip exercises correctly.  Patient leakage is coming from the rectal stump and will be telling the doctor.  Patient will benefit from skilled therapy to reduce pain and improve muscle coordination.    Rehab Potential Good   Clinical Impairments Affecting Rehab Potential s/p recta adenocarcinoma; radiation and chemotherapy 02/20/2015 to 03/30/2015; 05/25/2015 recto-sigmoid segmental resection; 05/31/2016 ileostomy take down; 07/12/2016 segmental resection of proimal and distal colon for a colovaginal fistula   PT Frequency 2x / week   PT Duration 8 weeks   PT Treatment/Interventions Biofeedback;Patient/family education;Scar mobilization;Manual techniques;Manual lymph drainage;Neuromuscular re-education;Balance training;Therapeutic exercise;Therapeutic activities;Energy conservation   PT Next Visit Plan soft tissue work to abdomen, along scars, work on abdominal contraction;    PT Home Exercise Plan progress as needed   Consulted and Agree with Plan of Care Patient      Patient will benefit from skilled therapeutic intervention in order to improve the following deficits and impairments:  Decreased range of motion, Increased fascial restricitons, Increased muscle spasms, Pain, Decreased activity tolerance, Decreased balance, Decreased scar mobility, Impaired flexibility, Decreased strength, Decreased mobility  Visit Diagnosis: Muscle weakness (generalized)  Unspecified lack of coordination  Other muscle spasm  Aftercare following surgery for neoplasm  Problem List Patient Active Problem List   Diagnosis Date Noted  . Colostomy in place [facility]) 07/12/2016  . Colovaginal fistula s/p omentopexy repair 07/12/2016 07/10/2016  . Rectocutaneous fistula 07/10/2016  . Gastroenteritis 12/20/2015  . Hydroureter on right 12/20/2015  . Abdominal pain 12/19/2015  . Hypokalemia 11/23/2015  . Colorectal delayed anastomotic  leak s/p resection & colostomy 07/12/2016 11/17/2015  . Pelvic abscess s/p drainage & omental pedicle flap 11/17/2015 06/23/2015  . Anemia of chronic disease 06/14/2015  . Hypertension   . IBS (irritable bowel syndrome)   . Depression   . Cavernous hemangioma of liver - segments 5 & 7 02/07/2015  . Osteoarthritis of left knee 02/07/2015  . Crohn's disease ([place]) 02/07/2015  . Rectal adenocarcinoma s/p LAR resection 05/25/2015 01/31/2015  . Pure hypercholesterolemia 02/21/2014  . Morbid obesity with BMI of 40.0-44.9, adult ([place]) 02/21/2014  . Family history of ischemic heart disease 02/21/2014    Earlie Counts, PT 11/19/16 12:38 PM    [facility] [address] [place], [place], [address] Phone: [phone]   Fax:  [phone]  Name: CREE NAPOLI MRN: [id number] Date of Birth: 11/10/1965

## 2016-11-19 NOTE — Patient Instructions (Addendum)
Slow Contraction: Gravity Eliminated (Side-Lying)    Lie on left side, hips and knees slightly bent. Slowly squeeze pelvic floor for _2__ seconds. Rest for _5__ seconds. Repeat _10__ times. Do _2__ times a day.   Copyright  VHI. All rights reserved.    Slow Contraction: Gravity Resisted (Sitting)    Sitting, slowly squeeze pelvic floor for _2__ seconds. Rest for _5__ seconds. Repeat _10__ times. Do _3__ times a day.  Copyright  VHI. All rights reserved.  ABDUCTION: Standing (Active)    Stand, feet flat. Lift right leg out to side. Tighten your abdominals, lead with heel and go slowly. Complete _2__ sets of _15__ repetitions both legs. Perform _1__ sessions per day.  http://gtsc.exer.us/111   Copyright  VHI. All rights reserved.  EXTENSION: Standing (Active)    Stand, both feet flat. Draw right leg behind body as far as possible. Squeeze buttocks and go slowly. Complete _2__ sets of __15_ repetitions. Perform _1__ sessions per day.  http://gtsc.exer.us/77   Copyright  VHI. All rights reserved.  [place] [address] [place],  [address] Phone # [phone] Fax [phone]

## 2016-11-19 NOTE — Progress Notes (Signed)
NPO AFTER MN WITH EXCEPTION CLEAR LIQUIDS UNTIL 0630 (NO CREAM / MILK PRODUCTS).  ARRIVE AT 1100.  NEEDS HG.  WILL TAKE NEXIUM AM DOS W/ SIPS OF WATER.

## 2016-11-21 ENCOUNTER — Encounter (HOSPITAL_BASED_OUTPATIENT_CLINIC_OR_DEPARTMENT_OTHER): Payer: Self-pay | Admitting: Anesthesiology

## 2016-11-21 ENCOUNTER — Ambulatory Visit (HOSPITAL_BASED_OUTPATIENT_CLINIC_OR_DEPARTMENT_OTHER): Payer: No Typology Code available for payment source | Admitting: Anesthesiology

## 2016-11-21 ENCOUNTER — Ambulatory Visit (HOSPITAL_BASED_OUTPATIENT_CLINIC_OR_DEPARTMENT_OTHER)
Admission: RE | Admit: 2016-11-21 | Discharge: 2016-11-21 | Disposition: A | Discharge status: Home / Self Care | Payer: No Typology Code available for payment source | Source: Ambulatory Visit | Attending: Surgery | Admitting: Surgery

## 2016-11-21 ENCOUNTER — Encounter (HOSPITAL_BASED_OUTPATIENT_CLINIC_OR_DEPARTMENT_OTHER)
Admission: RE | Disposition: A | Discharge status: Home / Self Care | Payer: Self-pay | Source: Ambulatory Visit | Attending: Surgery

## 2016-11-21 DIAGNOSIS — K5090 Crohn's disease, unspecified, without complications: Secondary | ICD-10-CM | POA: Insufficient documentation

## 2016-11-21 DIAGNOSIS — F329 Major depressive disorder, single episode, unspecified: Secondary | ICD-10-CM | POA: Insufficient documentation

## 2016-11-21 DIAGNOSIS — K432 Incisional hernia without obstruction or gangrene: Secondary | ICD-10-CM | POA: Diagnosis not present

## 2016-11-21 DIAGNOSIS — Z9221 Personal history of antineoplastic chemotherapy: Secondary | ICD-10-CM | POA: Diagnosis not present

## 2016-11-21 DIAGNOSIS — M542 Cervicalgia: Secondary | ICD-10-CM | POA: Insufficient documentation

## 2016-11-21 DIAGNOSIS — G4730 Sleep apnea, unspecified: Secondary | ICD-10-CM | POA: Diagnosis not present

## 2016-11-21 DIAGNOSIS — E7800 Pure hypercholesterolemia, unspecified: Secondary | ICD-10-CM | POA: Insufficient documentation

## 2016-11-21 DIAGNOSIS — T82848A Pain from vascular prosthetic devices, implants and grafts, initial encounter: Secondary | ICD-10-CM | POA: Insufficient documentation

## 2016-11-21 DIAGNOSIS — Z6841 Body Mass Index (BMI) 40.0 and over, adult: Secondary | ICD-10-CM | POA: Insufficient documentation

## 2016-11-21 DIAGNOSIS — Z923 Personal history of irradiation: Secondary | ICD-10-CM | POA: Diagnosis not present

## 2016-11-21 DIAGNOSIS — F419 Anxiety disorder, unspecified: Secondary | ICD-10-CM | POA: Diagnosis not present

## 2016-11-21 DIAGNOSIS — I10 Essential (primary) hypertension: Secondary | ICD-10-CM | POA: Diagnosis not present

## 2016-11-21 DIAGNOSIS — Z933 Colostomy status: Secondary | ICD-10-CM | POA: Diagnosis not present

## 2016-11-21 DIAGNOSIS — Z85048 Personal history of other malignant neoplasm of rectum, rectosigmoid junction, and anus: Secondary | ICD-10-CM | POA: Diagnosis not present

## 2016-11-21 DIAGNOSIS — K219 Gastro-esophageal reflux disease without esophagitis: Secondary | ICD-10-CM | POA: Insufficient documentation

## 2016-11-21 DIAGNOSIS — Z9889 Other specified postprocedural states: Secondary | ICD-10-CM | POA: Diagnosis not present

## 2016-11-21 DIAGNOSIS — Z86010 Personal history of colonic polyps: Secondary | ICD-10-CM | POA: Insufficient documentation

## 2016-11-21 DIAGNOSIS — Z79891 Long term (current) use of opiate analgesic: Secondary | ICD-10-CM | POA: Diagnosis not present

## 2016-11-21 DIAGNOSIS — X58XXXA Exposure to other specified factors, initial encounter: Secondary | ICD-10-CM | POA: Diagnosis not present

## 2016-11-21 HISTORY — DX: Unspecified abdominal pain: R10.9

## 2016-11-21 HISTORY — DX: Diaphragmatic hernia without obstruction or gangrene: K44.9

## 2016-11-21 HISTORY — DX: Drug-induced polyneuropathy: G62.0

## 2016-11-21 HISTORY — DX: Personal history of other diseases of the musculoskeletal system and connective tissue: Z87.39

## 2016-11-21 HISTORY — DX: Colostomy status: Z93.3

## 2016-11-21 HISTORY — DX: Personal history of other diseases of the digestive system: Z87.19

## 2016-11-21 HISTORY — DX: Family history of other specified conditions: Z84.89

## 2016-11-21 HISTORY — DX: Unspecified osteoarthritis, unspecified site: M19.90

## 2016-11-21 HISTORY — DX: Noninfective gastroenteritis and colitis, unspecified: K52.9

## 2016-11-21 HISTORY — DX: Unspecified rotator cuff tear or rupture of left shoulder, not specified as traumatic: M75.102

## 2016-11-21 HISTORY — PX: PORT-A-CATH REMOVAL: SHX5289

## 2016-11-21 HISTORY — DX: Personal history of other specified conditions: Z87.898

## 2016-11-21 HISTORY — DX: Drug-induced polyneuropathy: T45.1X5A

## 2016-11-21 HISTORY — DX: Personal history of other diseases of the circulatory system: Z86.79

## 2016-11-21 HISTORY — DX: Personal history of antineoplastic chemotherapy: Z92.21

## 2016-11-21 HISTORY — DX: Malignant neoplasm of rectum: C20

## 2016-11-21 HISTORY — DX: Iron deficiency anemia, unspecified: D50.9

## 2016-11-21 SURGERY — REMOVAL PORT-A-CATH
Anesthesia: Monitor Anesthesia Care | Site: Chest

## 2016-11-21 MED ORDER — PROMETHAZINE HCL 25 MG/ML IJ SOLN
6.2500 mg | INTRAMUSCULAR | Status: DC | PRN
  Filled 2016-11-21: qty 1

## 2016-11-21 MED ORDER — CHLORHEXIDINE GLUCONATE CLOTH 2 % EX PADS
6.0000 | MEDICATED_PAD | Freq: Once | CUTANEOUS | Status: DC
  Filled 2016-11-21: qty 6

## 2016-11-21 MED ORDER — MIDAZOLAM HCL 5 MG/5ML IJ SOLN
INTRAMUSCULAR | Status: DC | PRN
  Administered 2016-11-21: 2 mg via INTRAVENOUS

## 2016-11-21 MED ORDER — MIDAZOLAM HCL 2 MG/2ML IJ SOLN
INTRAMUSCULAR | Status: AC
  Filled 2016-11-21: qty 2

## 2016-11-21 MED ORDER — ACETAMINOPHEN 500 MG PO TABS
ORAL_TABLET | ORAL | Status: AC
  Filled 2016-11-21: qty 2

## 2016-11-21 MED ORDER — SCOPOLAMINE 1 MG/3DAYS TD PT72
MEDICATED_PATCH | TRANSDERMAL | Status: AC
  Filled 2016-11-21: qty 1

## 2016-11-21 MED ORDER — GABAPENTIN 300 MG PO CAPS
300.0000 mg | ORAL_CAPSULE | ORAL | Status: AC
  Administered 2016-11-21: 300 mg via ORAL
  Filled 2016-11-21: qty 1

## 2016-11-21 MED ORDER — CELECOXIB 200 MG PO CAPS
ORAL_CAPSULE | ORAL | Status: AC
  Filled 2016-11-21: qty 2

## 2016-11-21 MED ORDER — FENTANYL CITRATE (PF) 100 MCG/2ML IJ SOLN
INTRAMUSCULAR | Status: AC
  Filled 2016-11-21: qty 2

## 2016-11-21 MED ORDER — PROPOFOL 500 MG/50ML IV EMUL
INTRAVENOUS | Status: DC | PRN
  Administered 2016-11-21: 50 ug/kg/min via INTRAVENOUS

## 2016-11-21 MED ORDER — SCOPOLAMINE 1 MG/3DAYS TD PT72
1.0000 | MEDICATED_PATCH | Freq: Once | TRANSDERMAL | Status: DC
  Administered 2016-11-21: 1.5 mg via TRANSDERMAL
  Filled 2016-11-21: qty 1

## 2016-11-21 MED ORDER — ACETAMINOPHEN 500 MG PO TABS
1000.0000 mg | ORAL_TABLET | ORAL | Status: AC
  Administered 2016-11-21: 1000 mg via ORAL
  Filled 2016-11-21: qty 2

## 2016-11-21 MED ORDER — FENTANYL CITRATE (PF) 100 MCG/2ML IJ SOLN
25.0000 ug | INTRAMUSCULAR | Status: DC | PRN
  Filled 2016-11-21: qty 1

## 2016-11-21 MED ORDER — MEPERIDINE HCL 25 MG/ML IJ SOLN
6.2500 mg | INTRAMUSCULAR | Status: DC | PRN
  Filled 2016-11-21: qty 1

## 2016-11-21 MED ORDER — BUPIVACAINE-EPINEPHRINE 0.25% -1:200000 IJ SOLN
INTRAMUSCULAR | Status: DC | PRN
Start: 2016-11-21 — End: 2016-11-21
  Administered 2016-11-21: 20 mL

## 2016-11-21 MED ORDER — GABAPENTIN 300 MG PO CAPS
ORAL_CAPSULE | ORAL | Status: AC
  Filled 2016-11-21: qty 1

## 2016-11-21 MED ORDER — CELECOXIB 400 MG PO CAPS
400.0000 mg | ORAL_CAPSULE | ORAL | Status: AC
  Administered 2016-11-21: 400 mg via ORAL
  Filled 2016-11-21: qty 1

## 2016-11-21 MED ORDER — FENTANYL CITRATE (PF) 100 MCG/2ML IJ SOLN
INTRAMUSCULAR | Status: DC | PRN
  Administered 2016-11-21 (×2): 50 ug via INTRAVENOUS

## 2016-11-21 MED ORDER — LACTATED RINGERS IV SOLN
INTRAVENOUS | Status: DC
  Administered 2016-11-21: 12:00:00 via INTRAVENOUS
  Filled 2016-11-21: qty 1000

## 2016-11-21 MED ORDER — PROPOFOL 500 MG/50ML IV EMUL
INTRAVENOUS | Status: AC
  Filled 2016-11-21: qty 50

## 2016-11-21 MED ORDER — MIDAZOLAM HCL 2 MG/2ML IJ SOLN
0.5000 mg | Freq: Once | INTRAMUSCULAR | Status: DC | PRN
  Filled 2016-11-21: qty 2

## 2016-11-21 SURGICAL SUPPLY — 26 items
BLADE SURG 15 STRL LF DISP TIS (BLADE) ×1 IMPLANT
BLADE SURG 15 STRL SS (BLADE) ×2
CHLORAPREP W/TINT 26ML (MISCELLANEOUS) ×2 IMPLANT
COVER BACK TABLE 60X90IN (DRAPES) ×2 IMPLANT
COVER MAYO STAND STRL (DRAPES) ×2 IMPLANT
DRAPE LAPAROTOMY 100X72 PEDS (DRAPES) ×2 IMPLANT
DRAPE UTILITY XL STRL (DRAPES) ×2 IMPLANT
DRSG TEGADERM 4X4.75 (GAUZE/BANDAGES/DRESSINGS) ×2 IMPLANT
ELECT REM PT RETURN 9FT ADLT (ELECTROSURGICAL) ×2
ELECTRODE REM PT RTRN 9FT ADLT (ELECTROSURGICAL) ×1 IMPLANT
GLOVE BIOGEL PI IND STRL 7.0 (GLOVE) ×1 IMPLANT
GLOVE BIOGEL PI IND STRL 7.5 (GLOVE) ×3 IMPLANT
GLOVE BIOGEL PI INDICATOR 7.0 (GLOVE) ×1
GLOVE BIOGEL PI INDICATOR 7.5 (GLOVE) ×3
GLOVE ECLIPSE 8.0 STRL XLNG CF (GLOVE) ×2 IMPLANT
GLOVE INDICATOR 8.0 STRL GRN (GLOVE) ×2 IMPLANT
GOWN STRL REUS W/TWL LRG LVL3 (GOWN DISPOSABLE) ×4 IMPLANT
NEEDLE HYPO 22GX1.5 SAFETY (NEEDLE) ×2 IMPLANT
PACK BASIN DAY SURGERY FS (CUSTOM PROCEDURE TRAY) ×2 IMPLANT
PENCIL BUTTON HOLSTER BLD 10FT (ELECTRODE) ×2 IMPLANT
SPONGE GAUZE 2X2 8PLY STRL LF (GAUZE/BANDAGES/DRESSINGS) ×2 IMPLANT
SUT MNCRL AB 4-0 PS2 18 (SUTURE) ×2 IMPLANT
SUT VIC AB 3-0 SH 27 (SUTURE) ×2
SUT VIC AB 3-0 SH 27X BRD (SUTURE) ×1 IMPLANT
SYRINGE 20CC LL (MISCELLANEOUS) ×2 IMPLANT
TOWEL OR 17X24 6PK STRL BLUE (TOWEL DISPOSABLE) ×2 IMPLANT

## 2016-11-21 NOTE — Discharge Instructions (Signed)
GENERAL SURGERY: POST OP INSTRUCTIONS  ######################################################################  EAT Gradually transition to a high fiber diet with a fiber supplement over the next few weeks after discharge.  Start with a pureed / full liquid diet (see below)  WALK Walk an hour a day.  Control your pain to do that.    CONTROL PAIN Control pain so that you can walk, sleep, tolerate sneezing/coughing, go up/down stairs.  HAVE A BOWEL MOVEMENT DAILY Keep your bowels regular to avoid problems.  OK to try a laxative to override constipation.  OK to use an antidairrheal to slow down diarrhea.  Call if not better after 2 tries  CALL IF YOU HAVE PROBLEMS/CONCERNS Call if you are still struggling despite following these instructions. Call if you have concerns not answered by these instructions  ######################################################################    1. DIET: Follow a light bland diet the first 24 hours after arrival home, such as soup, liquids, crackers, etc.  Be sure to include lots of fluids daily.  Avoid fast food or heavy meals as your are more likely to get nauseated.   2. Take your usually prescribed home medications unless otherwise directed. 3. PAIN CONTROL: a. Pain is best controlled by a usual combination of three different methods TOGETHER: i. Ice/Heat ii. Over the counter pain medication iii. Prescription pain medication b. Most patients will experience some swelling and bruising around the incisions.  Ice packs or heating pads (30-60 minutes up to 6 times a day) will help. Use ice for the first few days to help decrease swelling and bruising, then switch to heat to help relax tight/sore spots and speed recovery.  Some people prefer to use ice alone, heat alone, alternating between ice & heat.  Experiment to what works for you.  Swelling and bruising can take several weeks to resolve.   c. It is helpful to take an over-the-counter pain medication  regularly for the first few weeks.  Choose one of the following that works best for you: i. Naproxen (Aleve, etc)  Two 230m tabs twice a day ii. Ibuprofen (Advil, etc) Three 2039mtabs four times a day (every meal & bedtime) iii. Acetaminophen (Tylenol, etc) 500-65069mour times a day (every meal & bedtime) d. A  prescription for pain medication (such as oxycodone, hydrocodone, etc) should be given to you upon discharge.  Take your pain medication as prescribed.  i. If you are having problems/concerns with the prescription medicine (does not control pain, nausea, vomiting, rash, itching, etc), please call us [phone] see if we need to switch you to a different pain medicine that will work better for you and/or control your side effect better. ii. If you need a refill on your pain medication, please contact your pharmacy.  They will contact our office to request authorization. Prescriptions will not be filled after 5 pm or on week-ends. 4. Avoid getting constipated.  Between the surgery and the pain medications, it is common to experience some constipation.  Increasing fluid intake and taking a fiber supplement (such as Metamucil, Citrucel, FiberCon, MiraLax, etc) 1-2 times a day regularly will usually help prevent this problem from occurring.  A mild laxative (prune juice, Milk of Magnesia, MiraLax, etc) should be taken according to package directions if there are no bowel movements after 48 hours.   5. Wash / shower every day.  You may shower over the dressings as they are waterproof.  Continue to shower over incision(s) after the dressing is off. 6. Remove your waterproof bandages  5 days after surgery.  You may leave the incision open to air.  You may have skin tapes (Steri Strips) covering the incision(s).  Leave them on until one week, then remove.  You may replace a dressing/Band-Aid to cover the incision for comfort if you wish.      7. ACTIVITIES as tolerated:   a. You may resume  regular (light) daily activities beginning the next day--such as daily self-care, walking, climbing stairs--gradually increasing activities as tolerated.  If you can walk 30 minutes without difficulty, it is safe to try more intense activity such as jogging, treadmill, bicycling, low-impact aerobics, swimming, etc. b. Save the most intensive and strenuous activity for last such as sit-ups, heavy lifting, contact sports, etc  Refrain from any heavy lifting or straining until you are off narcotics for pain control.   c. DO NOT PUSH THROUGH PAIN.  Let pain be your guide: If it hurts to do something, don't do it.  Pain is your body warning you to avoid that activity for another week until the pain goes down. d. You may drive when you are no longer taking prescription pain medication, you can comfortably wear a seatbelt, and you can safely maneuver your car and apply brakes. e. Dennis Bast may have sexual intercourse when it is comfortable.  8. FOLLOW UP in our office a. Please call [facility] at [phone] to set up an appointment to see your surgeon in the office for a follow-up appointment approximately 2-3 weeks after your surgery. b. Make sure that you call for this appointment the day you arrive home to insure a convenient appointment time. 9. IF YOU HAVE DISABILITY OR FAMILY LEAVE FORMS, BRING THEM TO THE OFFICE FOR PROCESSING.  DO NOT GIVE THEM TO YOUR DOCTOR.   WHEN TO CALL us [phone]: 1. Poor pain control 2. Reactions / problems with new medications (rash/itching, nausea, etc)  3. Fever over 101.5 F (38.5 C) 4. Worsening swelling or bruising 5. Continued bleeding from incision. 6. Increased pain, redness, or drainage from the incision 7. Difficulty breathing / swallowing   The clinic staff is available to answer your questions during regular business hours (8:30am-5pm).  Please dont hesitate to call and ask to speak to one of our nurses for clinical concerns.   If you have a medical emergency,  go to the nearest emergency room or call 911.  A surgeon from [facility] is always on call at the [facility], [place], [place], [place], [place]  [address] ? MAIN: [phone] ? TOLL FREE: [phone] ?  FAX [phone] [email or web address]   Post Anesthesia Home Care Instructions  Activity: Get plenty of rest for the remainder of the day. A responsible individual must stay with you for 24 hours following the procedure.  For the next 24 hours, DO NOT: -Drive a car -Paediatric nurse -Drink alcoholic beverages -Take any medication unless instructed by your physician -Make any legal decisions or sign important papers.  Meals: Start with liquid foods such as gelatin or soup. Progress to regular foods as tolerated. Avoid greasy, spicy, heavy foods. If nausea and/or vomiting occur, drink only clear liquids until the nausea and/or vomiting subsides. Call your physician if vomiting continues.  Special Instructions/Symptoms: Your throat may feel dry or sore from the anesthesia or the breathing tube placed in your throat during surgery. If this causes discomfort, gargle with warm salt water. The discomfort should disappear within 24 hours.  If you had a scopolamine patch placed behind your ear for the management of post- operative nausea and/or vomiting:  1. The medication in the patch is effective for 72 hours, after which it should be removed.  Wrap patch in a tissue and discard in the trash. Wash hands thoroughly with soap and water. 2. You may remove the patch earlier than 72 hours if you experience unpleasant side effects which may include dry mouth, dizziness or visual disturbances. 3. Avoid touching the patch. Wash your hands with soap and water after contact with the patch.

## 2016-11-21 NOTE — Interval H&P Note (Signed)
History and Physical Interval Note:  11/21/2016 10:43 AM  Lindsay Little  has presented today for surgery, with the diagnosis of Neck pain. Completion of need for central venous access  The various methods of treatment have been discussed with the patient and family. After consideration of risks, benefits and other options for treatment, the patient has consented to  Procedure(s): REMOVAL PORT-A-CATH (N/A) as a surgical intervention .  The patient's history has been reviewed, patient examined, no change in status, stable for surgery.  I have reviewed the patient's chart and labs.  Questions were answered to the patient's satisfaction.     Lydie Stammen C.

## 2016-11-21 NOTE — Op Note (Signed)
11/21/2016  1:43 PM  PATIENT:  Lindsay Little  51 y.o. female  Patient Care Team: [name], MD as PCP - General (Family Medicine) Michael Boston, MD as Consulting Physician (General Surgery) Truitt Merle, MD as Consulting Physician (Medical Oncology) Kyung Rudd, MD as Consulting Physician (Radiation Oncology) Teena Irani, MD as Consulting Physician (Gastroenterology) Raynelle Bring, MD as Consulting Physician (Urology)  PRE-OPERATIVE DIAGNOSIS:  Neck pain. Completion of need for central venous access  POST-OPERATIVE DIAGNOSIS:  Neck pain. Completion of need for central venous access  PROCEDURE:  Procedure(s): REMOVAL PORT-A-CATH  SURGEON:  [name], MD  ASSISTANT: OR staff   ANESTHESIA:   local and MAC  EBL:  Total I/O In: 300 [I.V.:300] Out: 3 [Blood:3].  See anesthesia record  Delay start of Pharmacological VTE agent (>24hrs) due to surgical blood loss or risk of bleeding:  no  DRAINS: none   SPECIMEN:  No Specimen  DISPOSITION OF SPECIMEN:  N/A  COUNTS:  YES  PLAN OF CARE: Discharge to home after PACU  PATIENT DISPOSITION:  PACU - hemodynamically stable.  INDICATION:  Patient status post completion of chemotherapy and IV fluids given complex history of rectal cancer.  I recommended surgery to remove the catheter.  I explained the technique of removal with use of local anesthesia.  Given anxiety and other issues, we offered more aggressive sedation/anesthesia for patient comfort.    Risks such as bleeding, infection, injury to other organs, need for repair of tissues / organs, need for further treatment, and other risks were discussed.  Post-operative dressing/incision care was discussed.  I noted a good likelihood this will help address the problem.   We will work to minimize complications. Questions were answered.  The patient expresses understanding & wishes to proceed with surgery.   OR FINDINGS: Intact Port a catheter without evidence of infection  in the right infraclavicular space.  DESCRIPTION:   The technique risks benefits and alternatives of portacatheter removal was discussed.  Questions answered.  The patient agreed to proceed.  The patient was positioned supine.  Monitored sedation provided by anesthesia.   The chest was prepped and draped in a sterile fashion.  I placed a field block of local anesthetic.  The patient tolerated it well.  I made a transverse incision over the prior scar of the portacatheter.  I was able to sharply encounter the SQ tissue pocket around the portacatheter.  I was able to free the attachments of the portacatheter to the chest wall, releasing the stitches holding it in place.   With that I could remove the port and the catheter.  I held pressure.  Hemostasis was good.  I closed the wound using absorbable suture.  3-0 Vicryl deep dermal and 4 Monocryl subcuticular suturing.  Sterile dressing applied.  The patient tolerated the procedure well.  I discussed postoperative care.  Questions were answered.  [name], M.D., F.A.C.S. Gastrointestinal and Minimally Invasive Surgery [facility] [address] [place], [place] [address] [phone] Main / Paging

## 2016-11-21 NOTE — Transfer of Care (Signed)
Immediate Anesthesia Transfer of Care Note  Patient: Lindsay Little  Procedure(s) Performed: Procedure(s): REMOVAL PORT-A-CATH (N/A)  Patient Location: PACU  Anesthesia Type:MAC  Level of Consciousness: awake, alert  and oriented  Airway & Oxygen Therapy: Patient Spontanous Breathing  Post-op Assessment: Report given to RN  Post vital signs: Reviewed and stable  Last Vitals:141/70,  76, 16, 98%, 98.4 Vitals:   11/21/16 1035  BP: 139/70  Pulse: 65  Resp: 20  Temp: 36.9 C    Last Pain:  Vitals:   11/21/16 1129  TempSrc:   PainSc: 7       Patients Stated Pain Goal: 7 (54/56/25 6389)  Complications: No apparent anesthesia complications

## 2016-11-21 NOTE — H&P (View-Only) (Signed)
Lindsay Little 10/24/2016 10:29 AM Location: [facility] Patient #: [id number] DOB: Oct 13, 1965 Married / Language: Lindsay Little / Race: White Female  History of Present Illness Lindsay Hector MD; 10/24/2016 2:45 PM) The patient is a 51 year old female who presents with colorectal cancer. Note for "Colorectal cancer": Patient returns status post resection of anastomosis with end colostomy and oversewing of rectal stump for presumed colovaginal fistula.  Patient comes in again with her husband. She is feeling much better. She is getting less drainage out her vagina but is not stopped. The wound has closed down. She's still gets some intermittent bouts of pelvic pain, but they are less often. She's been able to use only 20 hydrocodone in the past 2 weeks. She is emptying her colostomy bag once or twice a day. Switch to a different pouch through [employer]. That seems to work better. She saw a staple near her colostomy on the right side. Small but concerning. No flank pain. No nausea or vomiting. Still using narcotics for her flares of pelvic pain. She is hoping for another refill.Marland [employer] Appetite coming back. Energy level good. She is able to sit for longer periods of time but not back to normal completely. However this is the that she is felt in 2 years.     [name] MRN: [id number] DOB/AGE: 12-19-1965 51 y.o.  Admit date: 07/10/2016 Discharge date:  Patient Care Team: Lindsay Stains, MD as PCP - General (Family Medicine) Lindsay Boston, MD as Consulting Physician (General Surgery) Lindsay Merle, MD as Consulting Physician (Medical Oncology) Lindsay Rudd, MD as Consulting Physician (Radiation Oncology) Lindsay Irani, MD as Consulting Physician (Gastroenterology) Lindsay Bring, MD as Consulting Physician (Urology)  Discharge Diagnoses: Principal Problem: Colorectal delayed anastomotic leak s/p resection & colostomy 07/12/2016 Active Problems: Rectal  adenocarcinoma s/p LAR resection 05/25/2015 Morbid obesity with BMI of 40.0-44.9, adult ([place]) Colovaginal fistula s/p omentopexy repair 07/12/2016 Rectocutaneous fistula Colostomy in place ([place])   POST-OPERATIVE DIAGNOSIS: COLORECTAL ANASTOMOTIC BREAKDOWN WITH COLOVAGINAL FISTULA  PROCEDURE:  LAPAROSCOPIC LYSIS OF ADHESIONS LAPAROSCOPIC RESECTION OF COLORECTAL ANASTOMOSIS END COLOSTOMY OVERSEW OF RECTAL STUMP OMENTOPEXY OF RECTAL STUMP & VAGINAL CUFF  SURGEON: Lindsay Hector, MD  ASSISTANT: RN  ANESTHESIA: local and general  EBL: Total I/O In: 2400 [I.V.:2400] Out: 77 [Urine:260; Blood:200]  Delay start of Pharmacological VTE agent (>24hrs) due to surgical blood loss or risk of bleeding: no  DRAINS: 19 Fr drain with tip in the pelvis  SPECIMEN: PROXIMAL & DISTAL COLORECTAL ANASTOMOSIS  DISPOSITION OF SPECIMEN: PATHOLOGY  COUNTS: YES  PLAN OF CARE: Admit to inpatient  PATIENT DISPOSITION: PACU - hemodynamically stable.  INDICATION:   Morbidly obese female with bulky rectal cancer status post neoadjuvant chemoradiation therapy and robotically assisted resection. Developed delayed hematoma and abscess. Turned in a chronic fistula. Required loop ileostomy diversion and omental pedicle flap. Area close down. No leak by colonoscopy and enema. Underwent ileostomy takedown without incident. Five weeks later he noted worsening pain and drainage of vagina suspicious for: Bowel fistula. CT scan showing anastomotic breakdown. I recommended endoscopic assisted resection of colorectal anastomosis with permanent end colostomy.  The anatomy & physiology of the digestive tract was discussed. The pathophysiology was discussed. Natural history risks without surgery was discussed. I worked to give an overview of the disease and the frequent need to have multispecialty involvement. I feel the risks of no intervention will lead to serious problems that outweigh  the operative risks; therefore, I recommended a partial colectomy to remove  the pathology. Laparoscopic & open techniques were discussed.  Risks such as bleeding, infection, abscess, leak, reoperation, possible ostomy, hernia, heart attack, death, and other risks were discussed. I noted a good likelihood this will help address the problem. Goals of post-operative recovery were discussed as well. We will work to minimize complications. Educational materials on the pathology had been given in the office. Questions were answered.   The patient expressed understanding & wished to proceed with surgery.  OR FINDINGS:  Patient had stool resting on her coccyx in the very distal posterior and right lateral aspects. Breakdown of the anastomosis seen to be more posteriorly.  Remaining rectal stump seemed healthy and noninflamed. About 4 cm stump remains oversewn with the LOC.  No definite evidence of colovaginal fistula breakdown. Nonetheless omentum used as an omentopexy of her rectal stump and vaginal.  End colostomy in left upper quadrant along subcostal space along premarked region.  No obvious metastatic disease on visceral parietal peritoneum or liver.      ADDENDUM REPORT: 07/04/2016 18:48 ADDENDUM: Discussed additional history with [name]: Patient had a prior leak which was passed with omentum. Postoperative contrast enema showed no contrast extravasation. An enema tip was inserted into the rectum and a small amount of water-soluble contrast was instilled retrograde. Repeat CT imaging of the pelvis was then performed. Rectal contrast outlines the stool in the sigmoid colon and rectum, including the large collection of stool in the presacral space. Multiplanar images show that the stool is present within a large patulous rectosigmoid colon without definite contrast extravasation. This does not appear to be a separate contiguous collection but a distended rectosigmoid segment.  Also identified is presence of contrast material within the vagina compatible rectovaginal fistula, question arising at the RIGHT superolateral aspect of the vaginal cuff. Additionally, a linear collection of contrast material is seen extending posteriorly from the rectum RIGHT of the sacrum, extending into the medial aspect of the RIGHT gluteus maximus, consistent with an additional fistulous track. Infiltrative changes extend dorsally from the site on axial images into the subcutaneous fat. Electronically Signed By: Lindsay Little M.D. On: 07/04/2016 18:48 Addended by Lindsay Dana, MD on 07/04/2016 6:50 PM  Study Result CLINICAL DATA: Colon cancer post resection, chemotherapy and radiation therapy, possible fistula EXAM: CT ABDOMEN AND PELVIS WITH CONTRAST TECHNIQUE: Multidetector CT imaging of the abdomen and pelvis was performed using the standard protocol following bolus administration of intravenous contrast. CONTRAST: 9m ISOVUE-300 IOPAMIDOL (ISOVUE-300) INJECTION 61% PO, 10102mISOVUE-300 IOPAMIDOL (ISOVUE-300) INJECTION 61% IV COMPARISON: 02/21/2016 FINDINGS: Lower chest: Lung bases clear Hepatobiliary: Poorly defined area of low-attenuation RIGHT lobe of liver centrally image 17 measuring 2.1 x 1.8 cm. Additional area of poorly defined low-attenuation lateral aspect RIGHT lobe of liver more inferiorly, 2.3 x 1.6 cm image 29. These lesions have not significantly changed in size since the previous exam. Pancreas: Normal appearance Spleen: Normal appearance Adrenals/Urinary Tract: Adrenal glands normal appearance. Kidneys and ureters normal appearance. RIGHT hydronephrosis and hydroureter seen on previous exam resolved. Bladder wall appears mildly thickened but bladder is underdistended question artifact. Stomach/Bowel: Stomach normal appearance. Small bowel loops nondistended without wall thickening. Increased stool throughout colon. Interval takedown of ostomy in RIGHT  upper quadrant question loop ileostomy. Staple line at enterotomy closure in the RIGHT mid abdomen. Large subcutaneous fluid collection at site of ostomy take-down, 9.3 x 4.3 x 5.3 cm, may represent a sterile or infected collection. Mild overlying skin thickening and minimal surrounding infiltrative changes. Rectum distended by stool with  mild thickening of the wall with adjacent infiltration of presacral and perirectal fat, question related to radiation therapy and prior surgery. A large collection of stool and gas is seen within the presacral space. This appears to be predominantly cranial to the anastomotic suture line more inferiorly. This roughly occupies the same location as the gas and fluid collection identified on the prior CT. While this could represent stool within a distended rectum, the fact that this courses cranially in the same distribution as the previous abscess collection suggests this may be stool extraluminal to the rectosigmoid colon within a contiguous contained collection. This overall measures approximately 8 cm transverse by 7 cm AP and extends 9 cm length. Vascular/Lymphatic: Multiple normal sized para-aortic and LEFT common iliac nodes. No additional abdominal or pelvic adenopathy. Aorta normal caliber. 7 mm LEFT external iliac node image 86 unchanged. Multiple small nodules in the fat adjacent to the rectosigmoid colon axial images 73, 74, 78 question tiny lymph nodes, largest 8 mm short axis coronal image 62, minimally increased. Reproductive: Post hysterectomy. RIGHT ovary not visualized. Suspect visualized LEFT ovary. Air within vagina. Other: No free air or free fluid. No hernia. Musculoskeletal: Osseous structures unremarkable. IMPRESSION: Subcutaneous fluid collection at the site of ileostomy takedown, could be sterile or infected, measuring 9.3 x 4.3 x 5.3 cm; this could be aspirated under ultrasound guidance if clinically indicated. Large collection of stool within  the presacral space, could potentially be within a distended distorted rectum but since this occupies the same position as the abscess collection identified on the previous CT this is suspicious for a contiguous contained extraluminal stool collection communicating with the rectosigmoid colon, collection overall roughly 7 x 8 x 9 cm. Electronically Signed: By: Lindsay Little M.D. On: 07/04/2016 16:58  Result History  CT ABDOMEN PELVIS W CONTRAST (Order #[id number]) on 07/04/2016 - Order Result History Report - Result Edited <[email or web address]> Result Notes  Notes Recorded by Lindsay Boston, MD on 07/05/2016 at 7:44 AM EST Called and discussed with patient. She is eating okay. Intermittent diarrhea controlled with Imodium. I suspect its more overflow diarrhea around constantly chronically constipated rectum. While she is severely disappointed that the anastomosis is again broken down and she needs a colostomy, she would really like to wait until after the holiday season before planning surgery, especially since she struggled recovering last year. She is having some drainage of the vagina but it is not severe and it is manageable. She has some soreness but she does not have severely debilitating pelvic pain. She is not need narcotics around the clock and when she has works. She is ruling well on it though. She will keep her appointment with me next week to touch base. I will set up a time in the next couple weeks to do a laparoscopic assisted takedown anastomosis and colostomy. She needs more pain medications. I will ask my partner to fill an Rx. #40 ------ Notes Recorded by Lindsay Boston, MD on 07/05/2016 at 6:52 AM EST Patient completed ordered test/studies. Results noted below. Patient with leak of contrast to vagina suspecting for delayed colovaginal fistula. Most likely at the apex. I think her rectum has failed her again. I think she requires resection and takedown of her distal  colorectal anastomosis with permanent colostomy. Despite the drains, surgeries, fecal diversion, omental pedicle flap and a normal colonoscopy and normal enema, the area has broken down again. I think that her neorectum refuses to adequately heal despite the numerous surgeries and interventions. I will call &  discuss with the patient Lindsay Little, M.D., F.A.C.S. Gastrointestinal and Minimally Invasive Surgery [facility] [address] [place], [place] [address] [phone] Main / Paging    [name] MRN: [id number] DOB/AGE: November 07, 1965 51 y.o.  Admit date: 05/31/2016 Discharge date: 06/03/2016  Patient Care Team: Lindsay Stains, MD as PCP - General (Family Medicine) Lindsay Boston, MD as Consulting Physician (General Surgery) Lindsay Merle, MD as Consulting Physician ([place]) Lindsay Rudd, MD as Consulting Physician (Radiation Oncology) Lindsay Irani, MD as Consulting Physician (Gastroenterology) Lindsay Bring, MD as Consulting Physician (Urology)  Admission Diagnoses: Principal Problem: Colorectal delayed anastomotic leak - healed, s/p ileostomy takedown 05/31/2016 Active Problems: Rectal adenocarcinoma s/p LAR resection 05/25/2015 Morbid obesity with BMI of 40.0-44.9, adult ([place]) Crohn's disease ([place]) Hypertension IBS (irritable bowel syndrome) Depression Pelvic abscess s/p drainage & omental pedicle flap 11/17/2015   Discharge Diagnoses: Principal Problem: Colorectal delayed anastomotic leak - healed, s/p ileostomy takedown 05/31/2016 Active Problems: Rectal adenocarcinoma s/p LAR resection 05/25/2015 Morbid obesity with BMI of 40.0-44.9, adult ([place]) Crohn's disease ([place]) Hypertension IBS (irritable bowel syndrome) Depression Pelvic abscess s/p drainage & omental pedicle flap 11/17/2015   POST-OPERATIVE DIAGNOSIS:  loop ileostomy in place for fecal diversion  SURGERY:  05/31/2016  Procedure(s): TAKEDOWN LOOP ILEOSTOMY  SURGEON:   Surgeon(s): Lindsay Boston, MD  INDICATION: Pleasant patient status post low anterior resection for bulky rectal cancer. Developed collection that turned and abscess with delayed anastomotic leak and fistula. Eventually required fecal diversion. Had reoperation with omental pedicle flap. Biopsies disprove cancer recurrence. Anastomosis closed and healed. Colonoscopy two days ago showing omentum granulating at colorectal anastomosis otherwise no evidence of cancer recurrence or other cancers. The patient has recovered from that surgery with the anastomosis well-healed. It was felt safe to have the loop ileostomy taken down. I discussed the procedure with the patient:  The anatomy & physiology of the digestive tract was discussed. The pathophysiology was discussed. Possibility of remaining with an ostomy permanently was discussed. I offered ostomy takedown. Laparoscopic & open techniques were discussed.  Risks such as bleeding, infection, abscess, leak, reoperation, possible re-ostomy, injury to other organs, hernia, heart attack, death, and other risks were discussed. I noted a good likelihood this will help address the problem. Goals of post-operative recovery were discussed as well. We will work to minimize complications. Questions were answered. The patient expresses understanding & wishes to proceed with surgery.  OR FINDINGS: Thick abdominal wall with dense adhesions to the diverting loop ileostomy but no parastomal hernia. Normal anatomy.   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 Oral Surgery Shoulder Surgery Left.  Diagnostic Studies History Lindsay Hector, MD; 10/24/2016 10:36 AM) Colonoscopy >10 years ago Mammogram 1-3 years ago Pap Smear 1-5 years ago  Allergies Lindsay Hector, MD; 10/24/2016 10:36 AM) Allergies Reconciled Lidocaine *CHEMICALS* All of the Caine's except MARCAINE is ok per the pt Penicillin G Benzathine & Proc *PENICILLINS*  Sulfabenzamide *CHEMICALS*  Medication History Illene Regulus, CMA; 10/24/2016 10:31 AM) Medications Reconciled  Pregnancy / Birth History Lindsay Hector, MD; 10/24/2016 10:36 AM) Age at menarche 31 years. Age of menopause 34-50 Gravida 4 Irregular periods Maternal age 30-20 Para 0  Other Problems Lindsay Hector, MD; 10/24/2016 10:36 AM) Back Pain Diverticulosis Gastroesophageal Reflux Disease General anesthesia - complications High blood pressure Hypercholesterolemia Other disease, cancer, significant illness Rectal Cancer Transfusion history    Vitals (Lindsay Little CMA; 10/24/2016 10:30 AM) 10/24/2016 10:29 AM Weight: 304.2 lb Height: 71in Body Surface Area: 2.52 m Body Mass Index: 42.43 kg/m  Pulse: 82 (Regular)  BP: 128/82 (Sitting, Left Arm, Standard)      Physical Exam Lindsay Hector MD; 10/24/2016 2:45 PM)  General Mental Status-Alert. General Appearance-Not in acute distress. Voice-Normal. Note: Smiling. Move around easily. No nervous leg shaking  Integumentary Global Assessment Normal Exam - Distribution of scalp and body hair is normal. General  Characteristics Overall Skin Surface - no rashes and no suspicious lesions.  Head and Neck Head-normocephalic, atraumatic with no lesions or palpable masses. Face Global Assessment - atraumatic, no absence of expression. Neck Global Assessment - no abnormal movements, no decreased range of motion. Trachea-midline. Thyroid Gland Characteristics - non-tender.  Eye Eyeball - Left-Extraocular movements intact, No Nystagmus. Eyeball - Right-Extraocular movements intact, No Nystagmus. Upper Eyelid - Left-No Cyanotic. Upper Eyelid - Right-No Cyanotic. Note: Wears glasses. Vision acceptable  Chest and Lung Exam Inspection Accessory muscles - No use of accessory muscles in breathing. Note: R infraclavilcuar port clean without cellulitis  Abdomen Note: Morbidly obese but soft with clean panniculus. Mild rash in the pannicular fold. Pfannenstiel incision closed.   Left lower quadrant colostomy with intact bag and no leakage or drainage. Nice rosebud colostomy. One solitary 4m titanium staple removed from medial mucocutaneous junction  Other port site incisions with normal healing ridges. No guarding/rebound tenderness. Really no pain. No hernias. No drainage.  Female Genitourinary Note: Held off on exam since drainage tapering down & much improved  Rectal Note: Held off on exam since clinically much improved  Peripheral Vascular Upper Extremity Inspection - Left - Not Gangrenous, No Petechiae. Right - Not Gangrenous, No Petechiae.  Neurologic Neurologic evaluation reveals -normal attention span and ability to concentrate, able to name objects and repeat phrases. Appropriate fund of knowledge and normal coordination.  Neuropsychiatric Mental status exam performed with findings of-able to articulate well with normal speech/language, rate, volume and coherence and no evidence of hallucinations, delusions, obsessions or homicidal/suicidal  ideation. Orientation-oriented X3. Note: Laughing, joking. This is the best I have ever seen her.  Musculoskeletal Global Assessment Gait and Station - normal gait and station.  Lymphatic General Lymphatics Description - No Generalized lymphadenopathy.    Assessment & Plan (Lindsay HectorMD; 10/24/2016 2:45 PM)  CHRONIC PELVIC PAIN IN FEMALE (R10.2) Impression: Heart this is related to the chronic infections inflammation and needs more time.  Reasonable to evaluate with pelvic floor physical therapist. Would hesitate undergoing aggressive digital rectal examination.  Current Plans Pt Education - [facility] Pain Control (Lindsay Little) Continued Norco 5-325MG, 1-2 Tablet every six hours, as needed for pain, #30, 10/24/2016, No Refill. Pt Education - NCCSRS: chronic narcotic use: discussed with patient and provided information. PORT CATHETER IN PLACE ((C16.606 Impression: Completing chemotherapy and IV fluid therapy. She wishes to have removed because it causes some neck discomfort. I think that is reasonable.  Given her anxiety other issues, she would like to try and do  this under some sedation. Struggled with some issues with interrupted procedures. I think that is reasonable. We will coordinate convenient time to do outpatient removal with some sedation.  Current Plans You are being scheduled for surgery- Our schedulers will call you.  You should hear from our office's scheduling department within 5 working days about the location, date, and time of surgery. We try to make accommodations for patient's preferences in scheduling surgery, but sometimes the OR schedule or the surgeon's schedule prevents [place] from making those accommodations.  If you have not heard from our office [phone]) in 5 working days, call the office and ask for your surgeon's nurse.  If you have other questions about your diagnosis, plan, or surgery, call the office and ask for your surgeon's nurse.  I  recommended surgery to remove the catheter. I explained the technique of removal with use of local anesthesia & possible need for more aggressive sedation/anesthesia for patient comfort.  Risks such as bleeding, infection, and other risks were discussed. Post-operative dressing/incision care was discussed. I noted a good likelihood this will help address the problem. We will work to minimize complications. Questions were answered. The patient expresses understanding & wishes to proceed with surgery.  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 No strong evidence of it intraoperatively.  Vaginal drainage but improiving. While she is still having intermittent pelvic pain, the episodes are short & less intense. Drainage has gradually been tapering down. That is reassuring. Hopefully should continue to heal. Will take 4-12 months  I think she needs more time to heal. I do not think she can tolerate prolonged sitting periods such as jury duty until vaginal drainage has stopped and pelvic wound is closed down. I would wait until after 12 Nov 2016, at the soonest.  Reasonable to start looking for work if she feels like she can tolerated. Focusing on regular exercise to regain stamina. Watch out for depression and other chronic issues  We'll do one more course of fluconazole to make sure there is no vaginal yeast infection involved.  Current Plans Started Fluconazole 200MG, 1 (one) Tablet daily, 3 days starting 10/24/2016, No Refill. S/P COLON RESECTION - 1st postop visit (Z90.49) Impression: Recovering. Postoperative seroma drained.  Pfannenstiel wound closing down. Continue packing. Closing down  RECTAL ADENOCARCINOMA (C20) Impression: Complicated recovery due to late abscess and contained anastomotic leak with some help of  percutaneous drainage and antibiotics.  No evidence of cancer recurrence.  She would benefit from post-adjuvant chemotherapy but obviously this must be delayed until the fistula is closed. Sounds like they're observing only at this point given the prolonged period between surgery and healing.  Continue follow-up appointment with her medical oncologist. Dr. Burr Medico just saw her. Planning CT scan evaluation in about 3 months.  COLOSTOMY IN PLACE (Z93.3)  Current Plans Pt Education - [facility] Ostomy HCI ([place]): discussed with patient and provided information.  Lindsay Little, M.D., F.A.C.S. Gastrointestinal and Minimally Invasive Surgery [facility] [address], [place], [place] [address] [phone] Main / Paging

## 2016-11-21 NOTE — Interval H&P Note (Signed)
History and Physical Interval Note:  11/21/2016 12:38 PM  Lindsay Little  has presented today for surgery, with the diagnosis of Neck pain. Completion of need for central venous access  The various methods of treatment have been discussed with the patient and family. After consideration of risks, benefits and other options for treatment, the patient has consented to  Procedure(s): REMOVAL PORT-A-CATH (N/A) as a surgical intervention .  The patient's history has been reviewed, patient examined, no change in status, stable for surgery.  I have reviewed the patient's chart and labs.  Questions were answered to the patient's satisfaction.     Aries Kasa C.

## 2016-11-21 NOTE — Anesthesia Preprocedure Evaluation (Addendum)
Anesthesia Evaluation  Patient identified by MRN, date of birth, ID band Patient awake    Reviewed: Allergy & Precautions, NPO status , Patient's Chart, lab work & pertinent test results  History of Anesthesia Complications (+) PONV and history of anesthetic complications (possible reaction to lidocaine, but tolerated EMLA all throughout chemo)  Airway Mallampati: II  TM Distance: >3 FB Neck ROM: Full    Dental  (+) Dental Advisory Given   Pulmonary sleep apnea (mild) , former smoker,    breath sounds clear to auscultation       Cardiovascular (-) anginanegative cardio ROS   Rhythm:Regular Rate:Normal     Neuro/Psych negative neurological ROS  negative psych ROS   GI/Hepatic Neg liver ROS, GERD  Medicated and Controlled,Rectal cancer: chemo, XRT, surgery   Endo/Other  Morbid obesity  Renal/GU negative Renal ROS  negative genitourinary   Musculoskeletal negative musculoskeletal ROS (+)   Abdominal (+) + obese,   Peds negative pediatric ROS (+)  Hematology negative hematology ROS (+)   Anesthesia Other Findings   Reproductive/Obstetrics negative OB ROS                            Anesthesia Physical Anesthesia Plan  ASA: III  Anesthesia Plan: MAC   Post-op Pain Management:    Induction:   Airway Management Planned: Nasal Cannula and Natural Airway  Additional Equipment:   Intra-op Plan:   Post-operative Plan:   Informed Consent: I have reviewed the patients History and Physical, chart, labs and discussed the procedure including the risks, benefits and alternatives for the proposed anesthesia with the patient or authorized representative who has indicated his/her understanding and acceptance.   Dental advisory given  Plan Discussed with: CRNA and Surgeon  Anesthesia Plan Comments: (Plan routine monitors, MAC)        Anesthesia Quick Evaluation

## 2016-11-22 ENCOUNTER — Encounter: Payer: No Typology Code available for payment source | Admitting: Physical Therapy

## 2016-11-22 LAB — POCT HEMOGLOBIN-HEMACUE: HEMOGLOBIN: 12.7 g/dL (ref 12.0–15.0)

## 2016-11-22 NOTE — Anesthesia Postprocedure Evaluation (Signed)
Anesthesia Post Note  Patient: Lindsay Little  Procedure(s) Performed: Procedure(s) (LRB): REMOVAL PORT-A-CATH (N/A)  Patient location during evaluation: PACU Anesthesia Type: MAC Level of consciousness: awake and alert Pain management: pain level controlled Vital Signs Assessment: post-procedure vital signs reviewed and stable Respiratory status: spontaneous breathing, nonlabored ventilation, respiratory function stable and patient connected to nasal cannula oxygen Cardiovascular status: stable and blood pressure returned to baseline Anesthetic complications: no       Last Vitals:  Vitals:   11/21/16 1415 11/21/16 1434  BP: (!) 143/67 (!) 152/71  Pulse: 73 100  Resp: 16 19  Temp:  37.3 C    Last Pain:  Vitals:   11/21/16 1434  TempSrc: Oral  PainSc:                  Nickholas Goldston S

## 2016-11-25 ENCOUNTER — Ambulatory Visit: Payer: No Typology Code available for payment source | Admitting: Physical Therapy

## 2016-11-25 ENCOUNTER — Encounter (HOSPITAL_BASED_OUTPATIENT_CLINIC_OR_DEPARTMENT_OTHER): Payer: Self-pay | Admitting: Surgery

## 2016-11-25 DIAGNOSIS — M62838 Other muscle spasm: Secondary | ICD-10-CM

## 2016-11-25 DIAGNOSIS — M6281 Muscle weakness (generalized): Secondary | ICD-10-CM

## 2016-11-25 DIAGNOSIS — Z483 Aftercare following surgery for neoplasm: Secondary | ICD-10-CM

## 2016-11-25 DIAGNOSIS — R279 Unspecified lack of coordination: Secondary | ICD-10-CM

## 2016-11-25 NOTE — Therapy (Signed)
[facility] [address] [place], [place], [address] Phone: [phone]   Fax:  [phone]  Physical Therapy Treatment  Patient Details  Name: Lindsay Little MRN: [id number] Date of Birth: [birth year] Referring Provider: Dr. Michael Boston  Encounter Date: 11/25/2016      PT End of Session - 11/25/16 1057    Visit Number 4   Date for PT Re-Evaluation 01/13/17   Authorization Type [employer]   PT Start Time 1015   PT Stop Time 1102   PT Time Calculation (min) 47 min   Activity Tolerance Patient tolerated treatment well   Behavior During Therapy [facility] for tasks assessed/performed      Past Medical History:  Diagnosis Date  . Anxiety   . Chemotherapy-induced neuropathy (HCC)    toes and fingers numbness and tingling  . Colostomy in place [facility]) 07/12/2016   due to anastomosis breakdown w/ colovaginal fistula  . Complication of anesthesia   . Depression   . Family history of adverse reaction to anesthesia    mother-- ponv  . Gastroenteritis 12/20/2015  . GERD (gastroesophageal reflux disease)   . Hiatal hernia   . History of cancer chemotherapy 02-20-2015 to 03-30-2015  . History of cardiac murmur as a child   . History of chemotherapy   . History of chronic gastritis   . History of hypertension    no issues since multiple abdminal sx's and chemo--- no medication since 12/ 2016  . History of TMJ disorder   . Hypercholesteremia   . IBS (irritable bowel syndrome)    dx age 60  . Intermittent abdominal pain    post-op multiple abdominal sx's   . Microcytic anemia   . Mild sleep apnea    per pt study 2014  very mild osa , no cpap recommended, recommeded wt loss and sleep routine  . OA (osteoarthritis)    left knee /  left shoulder  . Obesity   . PONV (postoperative nausea and vomiting)    severe" needs Scopolamine PATCH"   . Portacath in place    right chest  . Rectal adenocarcinoma ([place]) oncologis-  dr  Burr Medico-- after radiation/ chemo (ypT1, N0) --  no recurrence per last note 03/ 2018   dx 01-13-2015-- Stage IIIC (T3, N2, M0) post concurrent radiation and chemotherapy 02-20-2015 to 03-30-2015 /  05-25-2015 s/p  LAR w/ RSO (post-op complicated by late abscess and contained anatomotic leak w/ help percutaneous drainage and antibiotics)   . Rotator cuff tear, left   . S/P radiation therapy 02/20/15-03/30/15   colon/rectal  . Vitamin D deficiency   . Wears glasses   . Wears glasses     Past Surgical History:  Procedure Laterality Date  . ABDOMINAL HYSTERECTOMY  1996   uterus and cervix  . COLON RESECTION N/A 07/12/2016   Procedure: LAPAROSCOPIC LYSIS OF ADHESIONS, OMENTOPEXY, HAND ASSISTED RESECTION OF  COLON, END TO END ANASTOMOSIS, COLOSTOMY;  Surgeon: Michael Boston, MD;  Location: [facility];  Service: General;  Laterality: N/A;  . COMBINED HYSTEROSCOPY DIAGNOSTIC / D&C  x2 1990's  . DIAGNOSTIC LAPAROSCOPY  age 31 and age 89  . EUS N/A 01/18/2015   Procedure: LOWER ENDOSCOPIC ULTRASOUND (EUS);  Surgeon: Arta Silence, MD;  Location: [facility];  Service: Endoscopy;  Laterality: N/A;  . EXCISION OF SKIN TAG  11/17/2015   Procedure: EXCISION OF SKIN TAG;  Surgeon: Michael Boston, MD;  Location: [facility];  Service: General;;  . ILEO LOOP COLOSTOMY  CLOSURE N/A 11/17/2015   Procedure: LAPAROSCOPIC DIVERTING LOOP ILEOSTOMY  DRAINAGE OF PELVIC ABSCESS;  Surgeon: Michael Boston, MD;  Location: [facility];  Service: General;  Laterality: N/A;  . ILEOSTOMY CLOSURE N/A 05/31/2016   Procedure: TAKEDOWN LOOP ILEOSTOMY;  Surgeon: Michael Boston, MD;  Location: [facility];  Service: General;  Laterality: N/A;  . IMPACTION REMOVAL  11/17/2015   Procedure: DISIMPACTION REMOVAL;  Surgeon: Michael Boston, MD;  Location: [facility];  Service: General;;  . KNEE ARTHROSCOPY Left 1990's  . KNEE ARTHROSCOPY W/ MENISCECTOMY Left 09/14/2009   and chondroplasty debridement  . LAPAROSCOPIC LYSIS OF ADHESIONS  11/17/2015   Procedure: LAPAROSCOPIC  LYSIS OF ADHESIONS;  Surgeon: Michael Boston, MD;  Location: [facility];  Service: General;;  . PORT-A-CATH REMOVAL N/A 11/21/2016   Procedure: REMOVAL PORT-A-CATH;  Surgeon: Michael Boston, MD;  Location: [facility];  Service: General;  Laterality: N/A;  . PORTACATH PLACEMENT N/A 05/25/2015   Procedure: INSERTION PORT-A-CATH;  Surgeon: Michael Boston, MD;  Location: [facility];  Service: General;  Laterality: N/A;-remains inplace Right chest.  . ROTATOR CUFF REPAIR Right 2006  . TONSILLECTOMY  age 56  . XI ROBOTIC ASSISTED LOWER ANTERIOR RESECTION N/A 05/25/2015   Procedure: XI ROBOTIC ASSISTED LOWER ANTERIOR RESECTION, , RIGID PROCTOSCOPY, RIGHT OOPHORECTOMY;  Surgeon: Michael Boston, MD;  Location: [facility];  Service: General;  Laterality: N/A;    There were no vitals filed for this visit.      Subjective Assessment - 11/25/16 1020    Subjective I was sore afterward. I had the port taken out. I did not talk yet to doctor yet about the drainage from rectum. Pain after urinartion decreased by 75%.    Patient Stated Goals stop drainage, decrease pelvic pain   Currently in Pain? Yes   Pain Score 3    Pain Location Abdomen   Pain Orientation Lower   Pain Descriptors / Indicators Aching   Pain Type Chronic pain   Pain Onset More than a month ago   Pain Frequency Constant   Aggravating Factors  increased movement; end of urination with increased pressure and pain   Pain Relieving Factors after urinating   Multiple Pain Sites No                         [facility] Adult PT Treatment/Exercise - 11/25/16 0001      Lumbar Exercises: Aerobic   Stationary Bike nustep 6 min arms #10 seat #9     Lumbar Exercises: Supine   Ab Set 10 reps;5 seconds  hookly push knee into hand   Clam 20 reps  redband with abdominal contraction   Bridge 10 reps     Manual Therapy   Manual Therapy Soft tissue mobilization;Myofascial release   Soft tissue mobilization to abdominal muscles, along  lower abdominal scar but not on it; trigger point release on the abdomen   Myofascial Release release of the urogenital diaphgram; release of the right lower abdominal areas                PT Education - 11/25/16 1055    Education provided No          PT Short Term Goals - 11/25/16 1053      PT SHORT TERM GOAL #1   Title independent with initial HEP   Time 1   Period Months   Status Achieved     PT SHORT TERM GOAL #2   Title understand  if the constant leakage is  coming out of the rectum or vaginal   Time 1   Period Months   Status Achieved     PT SHORT TERM GOAL #3   Title lower abdominal pain with daily tasks decreased >/= 25%   Time 1   Period Months   Status On-going  10% better     PT SHORT TERM GOAL #4   Title urinary leakage with coughing and sneezing decreased >/= 25%   Time 1   Period Months   Status On-going  not when she sneezes     PT SHORT TERM GOAL #5   Title pressure and pain during urination decreased >/= 25%   Time 1   Period Months   Status Achieved           PT Long Term Goals - 11/13/16 1435      PT LONG TERM GOAL #1   Title independent with HEP and understand how to exercise at the pool   Time 2   Period Months   Status New     PT LONG TERM GOAL #2   Title pressure and pain with urination decreased >/= 75% due to increased mobility of tissue   Time 2   Period Months   Status New     PT LONG TERM GOAL #3   Title urinary leakage with sneezing and coughing decreased >/= 75% due to increased pelvic floor strength >/= 4/5   Time 2   Period Months   Status New     PT LONG TERM GOAL #4   Title lower abdominal pain with increased activity decreased >/= 75% due to improved tissue mobility   Time 2   Period Months   Status New     PT LONG TERM GOAL #5   Title FOTO score </= 50%   Time 2   Period Months   Status New               Plan - 11/25/16 1057    Clinical Impression Statement Patient reports her  lower abdominal pain after urination has improved by 70%.  Patient is able to contract her abdominals.  Patient has tightness located suprapubically.  Patient continues to have trigger points in the abdomen.  Patient will benefit from skilled therapy to reduce pain and improve muscle coordination.    Rehab Potential Good   Clinical Impairments Affecting Rehab Potential s/p recta adenocarcinoma; radiation and chemotherapy 02/20/2015 to 03/30/2015; 05/25/2015 recto-sigmoid segmental resection; 05/31/2016 ileostomy take down; 07/12/2016 segmental resection of proimal and distal colon for a colovaginal fistula   PT Frequency 2x / week   PT Duration 8 weeks   PT Treatment/Interventions Biofeedback;Patient/family education;Scar mobilization;Manual techniques;Manual lymph drainage;Neuromuscular re-education;Balance training;Therapeutic exercise;Therapeutic activities;Energy conservation   PT Next Visit Plan soft tissue work to abdomen, along scars, work on abdominal contraction;    PT Home Exercise Plan progress as needed   Consulted and Agree with Plan of Care Patient      Patient will benefit from skilled therapeutic intervention in order to improve the following deficits and impairments:  Decreased range of motion, Increased fascial restricitons, Increased muscle spasms, Pain, Decreased activity tolerance, Decreased balance, Decreased scar mobility, Impaired flexibility, Decreased strength, Decreased mobility  Visit Diagnosis: Muscle weakness (generalized)  Other muscle spasm  Unspecified lack of coordination  Aftercare following surgery for neoplasm     Problem List Patient Active Problem List   Diagnosis Date Noted  . Colostomy in place [facility])  07/12/2016  . Colovaginal fistula s/p omentopexy repair 07/12/2016 07/10/2016  . Rectocutaneous fistula 07/10/2016  . Hydroureter on right 12/20/2015  . Abdominal pain 12/19/2015  . Hypokalemia 11/23/2015  . Colorectal delayed anastomotic leak s/p  resection & colostomy 07/12/2016 11/17/2015  . Pelvic abscess s/p drainage & omental pedicle flap 11/17/2015 06/23/2015  . Anemia of chronic disease 06/14/2015  . Hypertension   . IBS (irritable bowel syndrome)   . Depression   . Cavernous hemangioma of liver - segments 5 & 7 02/07/2015  . Osteoarthritis of left knee 02/07/2015  . Crohn's disease ([place]) 02/07/2015  . Rectal adenocarcinoma s/p LAR resection 05/25/2015 01/31/2015  . Pure hypercholesterolemia 02/21/2014  . Morbid obesity with BMI of 40.0-44.9, adult ([place]) 02/21/2014  . Family history of ischemic heart disease 02/21/2014    Earlie Counts, PT 11/25/16 11:01 AM   [facility] [address] [place], [place], [address] Phone: [phone]   Fax:  [phone]  Name: Lindsay Little MRN: [id number] Date of Birth: 1965/11/10

## 2016-11-27 ENCOUNTER — Encounter: Payer: Self-pay | Admitting: Physical Therapy

## 2016-11-27 ENCOUNTER — Ambulatory Visit: Payer: No Typology Code available for payment source | Admitting: Physical Therapy

## 2016-11-27 DIAGNOSIS — M62838 Other muscle spasm: Secondary | ICD-10-CM

## 2016-11-27 DIAGNOSIS — Z483 Aftercare following surgery for neoplasm: Secondary | ICD-10-CM

## 2016-11-27 DIAGNOSIS — R279 Unspecified lack of coordination: Secondary | ICD-10-CM

## 2016-11-27 DIAGNOSIS — M6281 Muscle weakness (generalized): Secondary | ICD-10-CM | POA: Diagnosis not present

## 2016-11-27 NOTE — Therapy (Signed)
[facility] [address], [place], [place], [address] Phone: [phone]   Fax:  [phone]  Physical Therapy Treatment  Patient Details  Name: Lindsay Little MRN: [id number] Date of Birth: [birth year] Referring Provider: Dr. Michael Boston  Encounter Date: 11/27/2016      PT End of Session - 11/27/16 1058    Visit Number 5   Date for PT Re-Evaluation 01/13/17   Authorization Type [employer]   PT Start Time 1015   PT Stop Time 1055   PT Time Calculation (min) 40 min   Activity Tolerance Patient tolerated treatment well   Behavior During Therapy [facility] for tasks assessed/performed      Past Medical History:  Diagnosis Date  . Anxiety   . Chemotherapy-induced neuropathy (HCC)    toes and fingers numbness and tingling  . Colostomy in place [facility]) 07/12/2016   due to anastomosis breakdown w/ colovaginal fistula  . Complication of anesthesia   . Depression   . Family history of adverse reaction to anesthesia    mother-- ponv  . Gastroenteritis 12/20/2015  . GERD (gastroesophageal reflux disease)   . Hiatal hernia   . History of cancer chemotherapy 02-20-2015 to 03-30-2015  . History of cardiac murmur as a child   . History of chemotherapy   . History of chronic gastritis   . History of hypertension    no issues since multiple abdminal sx's and chemo--- no medication since 12/ 2016  . History of TMJ disorder   . Hypercholesteremia   . IBS (irritable bowel syndrome)    dx age 27  . Intermittent abdominal pain    post-op multiple abdominal sx's   . Microcytic anemia   . Mild sleep apnea    per pt study 2014  very mild osa , no cpap recommended, recommeded wt loss and sleep routine  . OA (osteoarthritis)    left knee /  left shoulder  . Obesity   . PONV (postoperative nausea and vomiting)    severe" needs Scopolamine PATCH"   . Portacath in place    right chest  . Rectal adenocarcinoma ([place]) oncologis-  dr  Burr Medico-- after radiation/ chemo (ypT1, N0) --  no recurrence per last note 03/ 2018   dx 01-13-2015-- Stage IIIC (T3, N2, M0) post concurrent radiation and chemotherapy 02-20-2015 to 03-30-2015 /  05-25-2015 s/p  LAR w/ RSO (post-op complicated by late abscess and contained anatomotic leak w/ help percutaneous drainage and antibiotics)   . Rotator cuff tear, left   . S/P radiation therapy 02/20/15-03/30/15   colon/rectal  . Vitamin D deficiency   . Wears glasses   . Wears glasses     Past Surgical History:  Procedure Laterality Date  . ABDOMINAL HYSTERECTOMY  1996   uterus and cervix  . COLON RESECTION N/A 07/12/2016   Procedure: LAPAROSCOPIC LYSIS OF ADHESIONS, OMENTOPEXY, HAND ASSISTED RESECTION OF  COLON, END TO END ANASTOMOSIS, COLOSTOMY;  Surgeon: Michael Boston, MD;  [facility]: [facility];  Service: General;  Laterality: N/A;  . COMBINED HYSTEROSCOPY DIAGNOSTIC / D&C  x2 1990's  . DIAGNOSTIC LAPAROSCOPY  age 74 and age 36  . EUS N/A 01/18/2015   Procedure: LOWER ENDOSCOPIC ULTRASOUND (EUS);  Surgeon: Arta Silence, MD;  Location: [facility];  Service: Endoscopy;  Laterality: N/A;  . EXCISION OF SKIN TAG  11/17/2015   Procedure: EXCISION OF SKIN TAG;  Surgeon: Michael Boston, MD;  [facility];  Service: General;;  . ILEO LOOP COLOSTOMY  CLOSURE N/A 11/17/2015   Procedure: LAPAROSCOPIC DIVERTING LOOP ILEOSTOMY  DRAINAGE OF PELVIC ABSCESS;  Surgeon: Michael Boston, MD;  [facility];  Service: General;  Laterality: N/A;  . ILEOSTOMY CLOSURE N/A 05/31/2016   Procedure: TAKEDOWN LOOP ILEOSTOMY;  Surgeon: Michael Boston, MD;  [facility];  Service: General;  Laterality: N/A;  . IMPACTION REMOVAL  11/17/2015   Procedure: DISIMPACTION REMOVAL;  Surgeon: Michael Boston, MD;  [facility];  Service: General;;  . KNEE ARTHROSCOPY Left 1990's  . KNEE ARTHROSCOPY W/ MENISCECTOMY Left 09/14/2009   and chondroplasty debridement  . LAPAROSCOPIC LYSIS OF ADHESIONS  11/17/2015   Procedure: LAPAROSCOPIC  LYSIS OF ADHESIONS;  Surgeon: Michael Boston, MD;  [facility];  Service: General;;  . PORT-A-CATH REMOVAL N/A 11/21/2016   Procedure: REMOVAL PORT-A-CATH;  Surgeon: Michael Boston, MD;  Location: [facility];  Service: General;  Laterality: N/A;  . PORTACATH PLACEMENT N/A 05/25/2015   Procedure: INSERTION PORT-A-CATH;  Surgeon: Michael Boston, MD;  [facility];  Service: General;  Laterality: N/A;-remains inplace Right chest.  . ROTATOR CUFF REPAIR Right 2006  . TONSILLECTOMY  age 75  . XI ROBOTIC ASSISTED LOWER ANTERIOR RESECTION N/A 05/25/2015   Procedure: XI ROBOTIC ASSISTED LOWER ANTERIOR RESECTION, , RIGID PROCTOSCOPY, RIGHT OOPHORECTOMY;  Surgeon: Michael Boston, MD;  [facility];  Service: General;  Laterality: N/A;    There were no vitals filed for this visit.      Subjective Assessment - 11/27/16 1019    Subjective I checked at the gym and they do not have a nustep. I was walking around [place] this morning so I have increased pain. I have increased pain due to weather.    Patient Stated Goals stop drainage, decrease pelvic pain   Currently in Pain? Yes   Pain Score 7    Pain Location Abdomen   Pain Orientation Lower   Pain Descriptors / Indicators Aching   Pain Type Chronic pain   Pain Onset More than a month ago   Pain Frequency Constant   Aggravating Factors  increased movement; end of urination with increased pressure and pain    Pain Relieving Factors after urinating   Multiple Pain Sites No                         [facility] Adult PT Treatment/Exercise - 11/27/16 0001      Lumbar Exercises: Aerobic   Stationary Bike nustep 8 min arms #10 seat #9 level 2     Lumbar Exercises: Standing   Other Standing Lumbar Exercises Tandem stance hold 15 sec 2 times per way with pelvic floor contraction; press hands into bar of treadmill with hip abduction and extension to work the abdominals 10x each;    Other Standing Lumbar Exercises stand  facing wall, presisng hands into wall and alternate shoulder flexion; Plie in standing with abdominal contraction 10 times with pelvic floor contraction     Manual Therapy   Manual Therapy Soft tissue mobilization;Myofascial release   Soft tissue mobilization to lower and right abdominal area to relax the trigger points.    Myofascial Release release of the urogenital diaphgram; release of the right lower abdominal areas                  PT Short Term Goals - 11/25/16 1053      PT SHORT TERM GOAL #1   Title independent with initial HEP   Time 1  Period Months   Status Achieved     PT SHORT TERM GOAL #2   Title understand if the constant leakage is  coming out of the rectum or vaginal   Time 1   Period Months   Status Achieved     PT SHORT TERM GOAL #3   Title lower abdominal pain with daily tasks decreased >/= 25%   Time 1   Period Months   Status On-going  10% better     PT SHORT TERM GOAL #4   Title urinary leakage with coughing and sneezing decreased >/= 25%   Time 1   Period Months   Status On-going  not when she sneezes     PT SHORT TERM GOAL #5   Title pressure and pain during urination decreased >/= 25%   Time 1   Period Months   Status Achieved           PT Long Term Goals - 11/13/16 1435      PT LONG TERM GOAL #1   Title independent with HEP and understand how to exercise at the pool   Time 2   Period Months   Status New     PT LONG TERM GOAL #2   Title pressure and pain with urination decreased >/= 75% due to increased mobility of tissue   Time 2   Period Months   Status New     PT LONG TERM GOAL #3   Title urinary leakage with sneezing and coughing decreased >/= 75% due to increased pelvic floor strength >/= 4/5   Time 2   Period Months   Status New     PT LONG TERM GOAL #4   Title lower abdominal pain with increased activity decreased >/= 75% due to improved tissue mobility   Time 2   Period Months   Status New     PT  LONG TERM GOAL #5   Title FOTO score </= 50%   Time 2   Period Months   Status New               Plan - 11/27/16 1059    Clinical Impression Statement Patient pain level decreased to 5/10 after soft tissue work. Patient was able to contract the abdominal with standing exercises when she is pressing hands into something. Patient shows good posture with exercises.  Patient able to not move her trunk with exercises. Patient will benefit from skilled  therapy to reduce pain and improve muscle coordination.    Rehab Potential Good   Clinical Impairments Affecting Rehab Potential s/p recta adenocarcinoma; radiation and chemotherapy 02/20/2015 to 03/30/2015; 05/25/2015 recto-sigmoid segmental resection; 05/31/2016 ileostomy take down; 07/12/2016 segmental resection of proimal and distal colon for a colovaginal fistula   PT Frequency 2x / week   PT Duration 8 weeks   PT Treatment/Interventions Biofeedback;Patient/family education;Scar mobilization;Manual techniques;Manual lymph drainage;Neuromuscular re-education;Balance training;Therapeutic exercise;Therapeutic activities;Energy conservation   PT Next Visit Plan soft tissue work to abdomen, along scars, work on abdominal contraction in standing and add to HEP   PT Home Exercise Plan progress as needed   Consulted and Agree with Plan of Care Patient      Patient will benefit from skilled therapeutic intervention in order to improve the following deficits and impairments:  Decreased range of motion, Increased fascial restricitons, Increased muscle spasms, Pain, Decreased activity tolerance, Decreased balance, Decreased scar mobility, Impaired flexibility, Decreased strength, Decreased mobility  Visit Diagnosis: Muscle weakness (generalized)  Other muscle spasm  Unspecified lack of coordination  Aftercare following surgery for neoplasm     Problem List Patient Active Problem List   Diagnosis Date Noted  . Colostomy in place [facility])  07/12/2016  . Colovaginal fistula s/p omentopexy repair 07/12/2016 07/10/2016  . Rectocutaneous fistula 07/10/2016  . Hydroureter on right 12/20/2015  . Abdominal pain 12/19/2015  . Hypokalemia 11/23/2015  . Colorectal delayed anastomotic leak s/p resection & colostomy 07/12/2016 11/17/2015  . Pelvic abscess s/p drainage & omental pedicle flap 11/17/2015 06/23/2015  . Anemia of chronic disease 06/14/2015  . Hypertension   . IBS (irritable bowel syndrome)   . Depression   . Cavernous hemangioma of liver - segments 5 & 7 02/07/2015  . Osteoarthritis of left knee 02/07/2015  . Crohn's disease ([place]) 02/07/2015  . Rectal adenocarcinoma s/p LAR resection 05/25/2015 01/31/2015  . Pure hypercholesterolemia 02/21/2014  . Morbid obesity with BMI of 40.0-44.9, adult ([place]) 02/21/2014  . Family history of ischemic heart disease 02/21/2014    Earlie Counts, PT 11/27/16 11:03 AM   [facility] [address], [place], [place], [address] Phone: [phone]   Fax:  [phone]  Name: Lindsay Little MRN: [id number] Date of Birth: 1966-06-17

## 2016-12-02 ENCOUNTER — Encounter: Payer: No Typology Code available for payment source | Admitting: Physical Therapy

## 2016-12-04 ENCOUNTER — Ambulatory Visit: Payer: No Typology Code available for payment source | Admitting: Physical Therapy

## 2016-12-04 ENCOUNTER — Encounter: Payer: Self-pay | Admitting: Physical Therapy

## 2016-12-04 DIAGNOSIS — Z483 Aftercare following surgery for neoplasm: Secondary | ICD-10-CM

## 2016-12-04 DIAGNOSIS — M6281 Muscle weakness (generalized): Secondary | ICD-10-CM

## 2016-12-04 DIAGNOSIS — R279 Unspecified lack of coordination: Secondary | ICD-10-CM

## 2016-12-04 DIAGNOSIS — M62838 Other muscle spasm: Secondary | ICD-10-CM

## 2016-12-04 NOTE — Therapy (Signed)
[facility] [address], [place], [place], [address] Phone: [phone]   Fax:  [phone]  Physical Therapy Treatment  Patient Details  Name: Lindsay Little MRN: [id number] Date of Birth: [birth year] Referring Provider: Dr. Michael Boston  Encounter Date: 12/04/2016      PT End of Session - 12/04/16 1020    Visit Number 6   Date for PT Re-Evaluation 01/13/17   Authorization Type [employer]   PT Start Time 1015   PT Stop Time 1100   PT Time Calculation (min) 45 min   Activity Tolerance Patient tolerated treatment well   Behavior During Therapy [facility] for tasks assessed/performed      Past Medical History:  Diagnosis Date  . Anxiety   . Chemotherapy-induced neuropathy (HCC)    toes and fingers numbness and tingling  . Colostomy in place [facility]) 07/12/2016   due to anastomosis breakdown w/ colovaginal fistula  . Complication of anesthesia   . Depression   . Family history of adverse reaction to anesthesia    mother-- ponv  . Gastroenteritis 12/20/2015  . GERD (gastroesophageal reflux disease)   . Hiatal hernia   . History of cancer chemotherapy 02-20-2015 to 03-30-2015  . History of cardiac murmur as a child   . History of chemotherapy   . History of chronic gastritis   . History of hypertension    no issues since multiple abdminal sx's and chemo--- no medication since 12/ 2016  . History of TMJ disorder   . Hypercholesteremia   . IBS (irritable bowel syndrome)    dx age 63  . Intermittent abdominal pain    post-op multiple abdominal sx's   . Microcytic anemia   . Mild sleep apnea    per pt study 2014  very mild osa , no cpap recommended, recommeded wt loss and sleep routine  . OA (osteoarthritis)    left knee /  left shoulder  . Obesity   . PONV (postoperative nausea and vomiting)    severe" needs Scopolamine PATCH"   . Portacath in place    right chest  . Rectal adenocarcinoma ([place]) oncologis-  dr  Burr Medico-- after radiation/ chemo (ypT1, N0) --  no recurrence per last note 03/ 2018   dx 01-13-2015-- Stage IIIC (T3, N2, M0) post concurrent radiation and chemotherapy 02-20-2015 to 03-30-2015 /  05-25-2015 s/p  LAR w/ RSO (post-op complicated by late abscess and contained anatomotic leak w/ help percutaneous drainage and antibiotics)   . Rotator cuff tear, left   . S/P radiation therapy 02/20/15-03/30/15   colon/rectal  . Vitamin D deficiency   . Wears glasses   . Wears glasses     Past Surgical History:  Procedure Laterality Date  . ABDOMINAL HYSTERECTOMY  1996   uterus and cervix  . COLON RESECTION N/A 07/12/2016   Procedure: LAPAROSCOPIC LYSIS OF ADHESIONS, OMENTOPEXY, HAND ASSISTED RESECTION OF  COLON, END TO END ANASTOMOSIS, COLOSTOMY;  Surgeon: Michael Boston, MD;  [facility];  Service: General;  Laterality: N/A;  . COMBINED HYSTEROSCOPY DIAGNOSTIC / D&C  x2 1990's  . DIAGNOSTIC LAPAROSCOPY  age 54 and age 15  . EUS N/A 01/18/2015   Procedure: LOWER ENDOSCOPIC ULTRASOUND (EUS);  Surgeon: Arta Silence, MD;  Location: [facility];  Service: Endoscopy;  Laterality: N/A;  . EXCISION OF SKIN TAG  11/17/2015   Procedure: EXCISION OF SKIN TAG;  Surgeon: Michael Boston, MD;  [facility];  Service: General;;  . ILEO LOOP COLOSTOMY  CLOSURE N/A 11/17/2015   Procedure: LAPAROSCOPIC DIVERTING LOOP ILEOSTOMY  DRAINAGE OF PELVIC ABSCESS;  Surgeon: Michael Boston, MD;  [facility];  Service: General;  Laterality: N/A;  . ILEOSTOMY CLOSURE N/A 05/31/2016   Procedure: TAKEDOWN LOOP ILEOSTOMY;  Surgeon: Michael Boston, MD;  [facility];  Service: General;  Laterality: N/A;  . IMPACTION REMOVAL  11/17/2015   Procedure: DISIMPACTION REMOVAL;  Surgeon: Michael Boston, MD;  [facility];  Service: General;;  . KNEE ARTHROSCOPY Left 1990's  . KNEE ARTHROSCOPY W/ MENISCECTOMY Left 09/14/2009   and chondroplasty debridement  . LAPAROSCOPIC LYSIS OF ADHESIONS  11/17/2015   Procedure: LAPAROSCOPIC  LYSIS OF ADHESIONS;  Surgeon: Michael Boston, MD;  [facility];  Service: General;;  . PORT-A-CATH REMOVAL N/A 11/21/2016   Procedure: REMOVAL PORT-A-CATH;  Surgeon: Michael Boston, MD;  [facility];  Service: General;  Laterality: N/A;  . PORTACATH PLACEMENT N/A 05/25/2015   Procedure: INSERTION PORT-A-CATH;  Surgeon: Michael Boston, MD;  [facility];  Service: General;  Laterality: N/A;-remains inplace Right chest.  . ROTATOR CUFF REPAIR Right 2006  . TONSILLECTOMY  age 66  . XI ROBOTIC ASSISTED LOWER ANTERIOR RESECTION N/A 05/25/2015   Procedure: XI ROBOTIC ASSISTED LOWER ANTERIOR RESECTION, , RIGID PROCTOSCOPY, RIGHT OOPHORECTOMY;  Surgeon: Michael Boston, MD;  [facility];  Service: General;  Laterality: N/A;    There were no vitals filed for this visit.      Subjective Assessment - 12/04/16 1018    Subjective i am having more drainage from the rectum today with increased pain in lower abdominal. Upper abdomen is better.    Patient Stated Goals stop drainage, decrease pelvic pain   Currently in Pain? Yes   Pain Score 7    Pain Location Abdomen   Pain Orientation Lower   Pain Descriptors / Indicators Aching   Pain Type Chronic pain   Pain Onset More than a month ago   Pain Frequency Constant   Aggravating Factors  increased movement; end of urination with increased pressure and pain   Pain Relieving Factors after urinating   Multiple Pain Sites No                         [facility] Adult PT Treatment/Exercise - 12/04/16 0001      Lumbar Exercises: Aerobic   Stationary Bike Nustep L2 legs only x 10 min  PTA to monitor     Lumbar Exercises: Seated   Sit to Stand --  Seated: ball squeeze/TA contract/red band ER & horiz abd 10x     Lumbar Exercises: Supine   Ab Set --  Ball squeeze with TA contraction 6x   Glut Set --  Adductor/TA/Glute cocontraction 6x   Heel Slides --  leg just off mat, bil 6x   Bent Knee Raise 10 reps    Bridge 10 reps;2 seconds  VC to slow speed, articluation, engaging core more                PT Education - 12/04/16 1044    Education provided Yes   Education Details HEP core supine & seated core exs   Person(s) Educated Patient   Methods Explanation;Demonstration;Tactile cues;Verbal cues;Handout   Comprehension Verbalized understanding;Returned demonstration          PT Short Term Goals - 11/25/16 1053      PT SHORT TERM GOAL #1   Title independent with initial HEP   Time 1  Period Months   Status Achieved     PT SHORT TERM GOAL #2   Title understand if the constant leakage is  coming out of the rectum or vaginal   Time 1   Period Months   Status Achieved     PT SHORT TERM GOAL #3   Title lower abdominal pain with daily tasks decreased >/= 25%   Time 1   Period Months   Status On-going  10% better     PT SHORT TERM GOAL #4   Title urinary leakage with coughing and sneezing decreased >/= 25%   Time 1   Period Months   Status On-going  not when she sneezes     PT SHORT TERM GOAL #5   Title pressure and pain during urination decreased >/= 25%   Time 1   Period Months   Status Achieved           PT Long Term Goals - 11/13/16 1435      PT LONG TERM GOAL #1   Title independent with HEP and understand how to exercise at the pool   Time 2   Period Months   Status New     PT LONG TERM GOAL #2   Title pressure and pain with urination decreased >/= 75% due to increased mobility of tissue   Time 2   Period Months   Status New     PT LONG TERM GOAL #3   Title urinary leakage with sneezing and coughing decreased >/= 75% due to increased pelvic floor strength >/= 4/5   Time 2   Period Months   Status New     PT LONG TERM GOAL #4   Title lower abdominal pain with increased activity decreased >/= 75% due to improved tissue mobility   Time 2   Period Months   Status New     PT LONG TERM GOAL #5   Title FOTO score </= 50%   Time 2   Period  Months   Status New               Plan - 12/04/16 1031    Clinical Impression Statement Patient pain felt better after core training.  Patient needed multiple muscle group cueing during core stabilizaiton due to advancement of the exercises.  Patient still has discomfort in the bottom from the scar tissue.  Patient continues to have increase leakage in the rectum due to drainage.  Patient will benefit from skilled therapy to improve muscle coordination and decrease pain in the buttock.    Rehab Potential Good   Clinical Impairments Affecting Rehab Potential s/p recta adenocarcinoma; radiation and chemotherapy 02/20/2015 to 03/30/2015; 05/25/2015 recto-sigmoid segmental resection; 05/31/2016 ileostomy take down; 07/12/2016 segmental resection of proimal and distal colon for a colovaginal fistula   PT Frequency 2x / week   PT Duration 8 weeks   PT Treatment/Interventions Biofeedback;Patient/family education;Scar mobilization;Manual techniques;Manual lymph drainage;Neuromuscular re-education;Balance training;Therapeutic exercise;Therapeutic activities;Energy conservation   PT Next Visit Plan soft tissue work to abdomen and buttocks, along scars,    PT Home Exercise Plan progress as needed   Recommended Other Services cert signed by MD on 11/13/2016   Consulted and Agree with Plan of Care Patient      Patient will benefit from skilled therapeutic intervention in order to improve the following deficits and impairments:  Decreased range of motion, Increased fascial restricitons, Increased muscle spasms, Pain, Decreased activity tolerance, Decreased balance, Decreased scar mobility, Impaired flexibility, Decreased strength, Decreased  mobility  Visit Diagnosis: Muscle weakness (generalized)  Other muscle spasm  Unspecified lack of coordination  Aftercare following surgery for neoplasm     Problem List Patient Active Problem List   Diagnosis Date Noted  . Colostomy in place [facility])  07/12/2016  . Colovaginal fistula s/p omentopexy repair 07/12/2016 07/10/2016  . Rectocutaneous fistula 07/10/2016  . Hydroureter on right 12/20/2015  . Abdominal pain 12/19/2015  . Hypokalemia 11/23/2015  . Colorectal delayed anastomotic leak s/p resection & colostomy 07/12/2016 11/17/2015  . Pelvic abscess s/p drainage & omental pedicle flap 11/17/2015 06/23/2015  . Anemia of chronic disease 06/14/2015  . Hypertension   . IBS (irritable bowel syndrome)   . Depression   . Cavernous hemangioma of liver - segments 5 & 7 02/07/2015  . Osteoarthritis of left knee 02/07/2015  . Crohn's disease ([place]) 02/07/2015  . Rectal adenocarcinoma s/p LAR resection 05/25/2015 01/31/2015  . Pure hypercholesterolemia 02/21/2014  . Morbid obesity with BMI of 40.0-44.9, adult ([place]) 02/21/2014  . Family history of ischemic heart disease 02/21/2014    Earlie Counts, PT 12/04/16 11:01 AM   [facility] [address] [place], [place], [address] Phone: [phone]   Fax:  [phone]  Name: REY FORS MRN: [id number] Date of Birth: [birth year]

## 2016-12-04 NOTE — Patient Instructions (Signed)
      Resisted Horizontal Abduction: Bilateral   Sit with ball between knees: tubing in both hands, arms out in front. Squeeze the ball, engage the lower abs by lifting them up & in then bring arms straight. Sit tall!! Lift your heart & pinch shoulder blades together by puling the arms straight out & slowly in. . Repeat _10___ times per set. Do 2____ sets per session. Do _1-2___ sessions per day.   #2: Same position of above: but arms will be in different position. Bend the elbows 90 degrees by yourside, palms up. Rotate the arms outward feeling the shoulder blade muscles. Slowly return: Do 10x, 1-2 sets 2x day.

## 2016-12-10 ENCOUNTER — Telehealth: Payer: Self-pay | Admitting: *Deleted

## 2016-12-10 ENCOUNTER — Ambulatory Visit: Payer: No Typology Code available for payment source | Admitting: Physical Therapy

## 2016-12-10 ENCOUNTER — Encounter: Payer: Self-pay | Admitting: Physical Therapy

## 2016-12-10 DIAGNOSIS — Z483 Aftercare following surgery for neoplasm: Secondary | ICD-10-CM

## 2016-12-10 DIAGNOSIS — M62838 Other muscle spasm: Secondary | ICD-10-CM

## 2016-12-10 DIAGNOSIS — R279 Unspecified lack of coordination: Secondary | ICD-10-CM

## 2016-12-10 DIAGNOSIS — M6281 Muscle weakness (generalized): Secondary | ICD-10-CM

## 2016-12-10 NOTE — Telephone Encounter (Signed)
Received vm call from pt stating that she hasn't heard anything about scheduling CT due before MD appt 12/25/16.  Message to [place] to check on auth.

## 2016-12-10 NOTE — Therapy (Signed)
[facility] [address], [place], [place], [address] Phone: [phone]   Fax:  [phone]  Physical Therapy Treatment  Patient Details  Name: Lindsay Little MRN: [id number] Date of Birth: [birth year] Referring Provider: Dr. Michael Boston  Encounter Date: 12/10/2016      PT End of Session - 12/10/16 1141    Visit Number 7   Date for PT Re-Evaluation 01/13/17   Authorization Type [employer]   PT Start Time 1100   PT Stop Time 1141   PT Time Calculation (min) 41 min   Activity Tolerance Patient tolerated treatment well   Behavior During Therapy [facility] for tasks assessed/performed      Past Medical History:  Diagnosis Date  . Anxiety   . Chemotherapy-induced neuropathy (HCC)    toes and fingers numbness and tingling  . Colostomy in place [facility]) 07/12/2016   due to anastomosis breakdown w/ colovaginal fistula  . Complication of anesthesia   . Depression   . Family history of adverse reaction to anesthesia    mother-- ponv  . Gastroenteritis 12/20/2015  . GERD (gastroesophageal reflux disease)   . Hiatal hernia   . History of cancer chemotherapy 02-20-2015 to 03-30-2015  . History of cardiac murmur as a child   . History of chemotherapy   . History of chronic gastritis   . History of hypertension    no issues since multiple abdminal sx's and chemo--- no medication since 12/ 2016  . History of TMJ disorder   . Hypercholesteremia   . IBS (irritable bowel syndrome)    dx age 46  . Intermittent abdominal pain    post-op multiple abdominal sx's   . Microcytic anemia   . Mild sleep apnea    per pt study 2014  very mild osa , no cpap recommended, recommeded wt loss and sleep routine  . OA (osteoarthritis)    left knee /  left shoulder  . Obesity   . PONV (postoperative nausea and vomiting)    severe" needs Scopolamine PATCH"   . Portacath in place    right chest  . Rectal adenocarcinoma ([place]) oncologis-  dr  Burr Medico-- after radiation/ chemo (ypT1, N0) --  no recurrence per last note 03/ 2018   dx 01-13-2015-- Stage IIIC (T3, N2, M0) post concurrent radiation and chemotherapy 02-20-2015 to 03-30-2015 /  05-25-2015 s/p  LAR w/ RSO (post-op complicated by late abscess and contained anatomotic leak w/ help percutaneous drainage and antibiotics)   . Rotator cuff tear, left   . S/P radiation therapy 02/20/15-03/30/15   colon/rectal  . Vitamin D deficiency   . Wears glasses   . Wears glasses     Past Surgical History:  Procedure Laterality Date  . ABDOMINAL HYSTERECTOMY  1996   uterus and cervix  . COLON RESECTION N/A 07/12/2016   Procedure: LAPAROSCOPIC LYSIS OF ADHESIONS, OMENTOPEXY, HAND ASSISTED RESECTION OF  COLON, END TO END ANASTOMOSIS, COLOSTOMY;  Surgeon: Michael Boston, MD;  Location: [facility];  Service: General;  Laterality: N/A;  . COMBINED HYSTEROSCOPY DIAGNOSTIC / D&C  x2 1990's  . DIAGNOSTIC LAPAROSCOPY  age 18 and age 82  . EUS N/A 01/18/2015   Procedure: LOWER ENDOSCOPIC ULTRASOUND (EUS);  Surgeon: Arta Silence, MD;  Location: [facility];  Service: Endoscopy;  Laterality: N/A;  . EXCISION OF SKIN TAG  11/17/2015   Procedure: EXCISION OF SKIN TAG;  Surgeon: Michael Boston, MD;  [facility];  Service: General;;  . ILEO LOOP COLOSTOMY  CLOSURE N/A 11/17/2015   Procedure: LAPAROSCOPIC DIVERTING LOOP ILEOSTOMY  DRAINAGE OF PELVIC ABSCESS;  Surgeon: Michael Boston, MD;  [facility];  Service: General;  Laterality: N/A;  . ILEOSTOMY CLOSURE N/A 05/31/2016   Procedure: TAKEDOWN LOOP ILEOSTOMY;  Surgeon: Michael Boston, MD;  [facility];  Service: General;  Laterality: N/A;  . IMPACTION REMOVAL  11/17/2015   Procedure: DISIMPACTION REMOVAL;  Surgeon: Michael Boston, MD;  [facility];  Service: General;;  . KNEE ARTHROSCOPY Left 1990's  . KNEE ARTHROSCOPY W/ MENISCECTOMY Left 09/14/2009   and chondroplasty debridement  . LAPAROSCOPIC LYSIS OF ADHESIONS  11/17/2015   Procedure: LAPAROSCOPIC  LYSIS OF ADHESIONS;  Surgeon: Michael Boston, MD;  [facility];  Service: General;;  . PORT-A-CATH REMOVAL N/A 11/21/2016   Procedure: REMOVAL PORT-A-CATH;  Surgeon: Michael Boston, MD;  Location: [facility];  Service: General;  Laterality: N/A;  . PORTACATH PLACEMENT N/A 05/25/2015   Procedure: INSERTION PORT-A-CATH;  Surgeon: Michael Boston, MD;  [facility];  Service: General;  Laterality: N/A;-remains inplace Right chest.  . ROTATOR CUFF REPAIR Right 2006  . TONSILLECTOMY  age 35  . XI ROBOTIC ASSISTED LOWER ANTERIOR RESECTION N/A 05/25/2015   Procedure: XI ROBOTIC ASSISTED LOWER ANTERIOR RESECTION, , RIGID PROCTOSCOPY, RIGHT OOPHORECTOMY;  Surgeon: Michael Boston, MD;  [facility];  Service: General;  Laterality: N/A;    There were no vitals filed for this visit.      Subjective Assessment - 12/10/16 1104    Subjective Drainage from rectum is same.  I have been trying to get an appoimtment with my Obgyn. Today is a good day. Pain after urination is better by 50%.    Patient Stated Goals stop drainage, decrease pelvic pain   Currently in Pain? Yes   Pain Score 3    Pain Location Abdomen   Pain Orientation Lower   Pain Descriptors / Indicators Aching   Pain Type Chronic pain   Pain Onset More than a month ago   Pain Frequency Constant   Aggravating Factors  increased movement; end of urination with increased pressure and pain   Pain Relieving Factors after urinating   Multiple Pain Sites No                         [facility] Adult PT Treatment/Exercise - 12/10/16 0001      Manual Therapy   Manual Therapy Soft tissue mobilization   Soft tissue mobilization along the gluteals, piriformis, levator ani, lumbar sacral area, right coccygeus muscle  left sidely                PT Education - 12/10/16 1140    Education provided Yes   Education Details information on the [employer] program   Person(s) Educated Patient   Methods  Explanation   Comprehension Verbalized understanding          PT Short Term Goals - 12/10/16 1143      PT SHORT TERM GOAL #3   Title lower abdominal pain with daily tasks decreased >/= 25%   Time 1   Period Months   Status Achieved     PT SHORT TERM GOAL #4   Title urinary leakage with coughing and sneezing decreased >/= 25%   Time 1   Period Months   Status Achieved     PT SHORT TERM GOAL #5   Title pressure and pain during urination decreased >/= 25%   Time  1   Period Months   Status Achieved           PT Long Term Goals - 12/10/16 1106      PT LONG TERM GOAL #2   Title pressure and pain with urination decreased >/= 75% due to increased mobility of tissue   Time 2   Period Months   Status On-going  50% better     PT LONG TERM GOAL #3   Title urinary leakage with sneezing and coughing decreased >/= 75% due to increased pelvic floor strength >/= 4/5   Time 2   Period Months   Status On-going  50% better     PT LONG TERM GOAL #4   Title lower abdominal pain with increased activity decreased >/= 75% due to improved tissue mobility   Time 2   Period Months   Status On-going  pain decreased by 60%               Plan - 12/10/16 1141    Clinical Impression Statement Patient reports her pain si 50% better in the lower abdominal area. Patient reports her urinary leakage is better by 60% and has most difficulty with sneezing.  Patient had less pain in rectal area after soft tissue work. Patient will benefit from skilled therapy to improve muscle coordination and decrease pain in the buttock.    Clinical Impairments Affecting Rehab Potential s/p recta adenocarcinoma; radiation and chemotherapy 02/20/2015 to 03/30/2015; 05/25/2015 recto-sigmoid segmental resection; 05/31/2016 ileostomy take down; 07/12/2016 segmental resection of proimal and distal colon for a colovaginal fistula   PT Frequency 2x / week   PT Duration 8 weeks   PT Treatment/Interventions  Biofeedback;Patient/family education;Scar mobilization;Manual techniques;Manual lymph drainage;Neuromuscular re-education;Balance training;Therapeutic exercise;Therapeutic activities;Energy conservation   PT Next Visit Plan soft tissue work to abdomen and buttocks, along scars, work on abdomen   PT Home Exercise Plan progress as needed   Consulted and Agree with Plan of Care Patient      Patient will benefit from skilled therapeutic intervention in order to improve the following deficits and impairments:  Decreased range of motion, Increased fascial restricitons, Increased muscle spasms, Pain, Decreased activity tolerance, Decreased balance, Decreased scar mobility, Impaired flexibility, Decreased strength, Decreased mobility  Visit Diagnosis: Muscle weakness (generalized)  Other muscle spasm  Unspecified lack of coordination  Aftercare following surgery for neoplasm     Problem List Patient Active Problem List   Diagnosis Date Noted  . Colostomy in place [facility]) 07/12/2016  . Colovaginal fistula s/p omentopexy repair 07/12/2016 07/10/2016  . Rectocutaneous fistula 07/10/2016  . Hydroureter on right 12/20/2015  . Abdominal pain 12/19/2015  . Hypokalemia 11/23/2015  . Colorectal delayed anastomotic leak s/p resection & colostomy 07/12/2016 11/17/2015  . Pelvic abscess s/p drainage & omental pedicle flap 11/17/2015 06/23/2015  . Anemia of chronic disease 06/14/2015  . Hypertension   . IBS (irritable bowel syndrome)   . Depression   . Cavernous hemangioma of liver - segments 5 & 7 02/07/2015  . Osteoarthritis of left knee 02/07/2015  . Crohn's disease ([place]) 02/07/2015  . Rectal adenocarcinoma s/p LAR resection 05/25/2015 01/31/2015  . Pure hypercholesterolemia 02/21/2014  . Morbid obesity with BMI of 40.0-44.9, adult ([place]) 02/21/2014  . Family history of ischemic heart disease 02/21/2014    Earlie Counts, PT 12/10/16 11:45 AM   [facility] [address], [place], [place], [address] Phone: [phone]   Fax:  [phone]  Name: Lindsay Little  MRN: [id number] Date of Birth: 05/12/1966

## 2016-12-12 ENCOUNTER — Encounter: Payer: Self-pay | Admitting: Physical Therapy

## 2016-12-12 ENCOUNTER — Ambulatory Visit: Payer: No Typology Code available for payment source | Admitting: Physical Therapy

## 2016-12-12 DIAGNOSIS — R279 Unspecified lack of coordination: Secondary | ICD-10-CM

## 2016-12-12 DIAGNOSIS — M62838 Other muscle spasm: Secondary | ICD-10-CM

## 2016-12-12 DIAGNOSIS — M6281 Muscle weakness (generalized): Secondary | ICD-10-CM

## 2016-12-12 DIAGNOSIS — Z483 Aftercare following surgery for neoplasm: Secondary | ICD-10-CM

## 2016-12-12 NOTE — Therapy (Signed)
[facility] [address], [place], [place], [address] Phone: [phone]   Fax:  [phone]  Physical Therapy Treatment  Patient Details  Name: Lindsay Little MRN: [id number] Date of Birth: Jun 23, 1966 Referring Provider: Dr. Michael Boston  Encounter Date: 12/12/2016      PT End of Session - 12/12/16 1136    Visit Number 8   Date for PT Re-Evaluation 01/13/17   Authorization Type [employer]   PT Start Time 1045   PT Stop Time 1130   PT Time Calculation (min) 45 min   Activity Tolerance Patient tolerated treatment well   Behavior During Therapy [facility] for tasks assessed/performed      Past Medical History:  Diagnosis Date  . Anxiety   . Chemotherapy-induced neuropathy (HCC)    toes and fingers numbness and tingling  . Colostomy in place [facility]) 07/12/2016   due to anastomosis breakdown w/ colovaginal fistula  . Complication of anesthesia   . Depression   . Family history of adverse reaction to anesthesia    mother-- ponv  . Gastroenteritis 12/20/2015  . GERD (gastroesophageal reflux disease)   . Hiatal hernia   . History of cancer chemotherapy 02-20-2015 to 03-30-2015  . History of cardiac murmur as a child   . History of chemotherapy   . History of chronic gastritis   . History of hypertension    no issues since multiple abdminal sx's and chemo--- no medication since 12/ 2016  . History of TMJ disorder   . Hypercholesteremia   . IBS (irritable bowel syndrome)    dx age 29  . Intermittent abdominal pain    post-op multiple abdominal sx's   . Microcytic anemia   . Mild sleep apnea    per pt study 2014  very mild osa , no cpap recommended, recommeded wt loss and sleep routine  . OA (osteoarthritis)    left knee /  left shoulder  . Obesity   . PONV (postoperative nausea and vomiting)    severe" needs Scopolamine PATCH"   . Portacath in place    right chest  . Rectal adenocarcinoma ([place]) oncologis-  dr  Burr Medico-- after radiation/ chemo (ypT1, N0) --  no recurrence per last note 03/ 2018   dx 01-13-2015-- Stage IIIC (T3, N2, M0) post concurrent radiation and chemotherapy 02-20-2015 to 03-30-2015 /  05-25-2015 s/p  LAR w/ RSO (post-op complicated by late abscess and contained anatomotic leak w/ help percutaneous drainage and antibiotics)   . Rotator cuff tear, left   . S/P radiation therapy 02/20/15-03/30/15   colon/rectal  . Vitamin D deficiency   . Wears glasses   . Wears glasses     Past Surgical History:  Procedure Laterality Date  . ABDOMINAL HYSTERECTOMY  1996   uterus and cervix  . COLON RESECTION N/A 07/12/2016   Procedure: LAPAROSCOPIC LYSIS OF ADHESIONS, OMENTOPEXY, HAND ASSISTED RESECTION OF  COLON, END TO END ANASTOMOSIS, COLOSTOMY;  Surgeon: Michael Boston, MD;  [facility];  Service: General;  Laterality: N/A;  . COMBINED HYSTEROSCOPY DIAGNOSTIC / D&C  x2 1990's  . DIAGNOSTIC LAPAROSCOPY  age 49 and age 24  . EUS N/A 01/18/2015   Procedure: LOWER ENDOSCOPIC ULTRASOUND (EUS);  Surgeon: Arta Silence, MD;  Location: [facility];  Service: Endoscopy;  Laterality: N/A;  . EXCISION OF SKIN TAG  11/17/2015   Procedure: EXCISION OF SKIN TAG;  Surgeon: Michael Boston, MD;  [facility];  Service: General;;  . ILEO LOOP COLOSTOMY  CLOSURE N/A 11/17/2015   Procedure: LAPAROSCOPIC DIVERTING LOOP ILEOSTOMY  DRAINAGE OF PELVIC ABSCESS;  Surgeon: Michael Boston, MD;  [facility];  Service: General;  Laterality: N/A;  . ILEOSTOMY CLOSURE N/A 05/31/2016   Procedure: TAKEDOWN LOOP ILEOSTOMY;  Surgeon: Michael Boston, MD;  [facility];  Service: General;  Laterality: N/A;  . IMPACTION REMOVAL  11/17/2015   Procedure: DISIMPACTION REMOVAL;  Surgeon: Michael Boston, MD;  [facility];  Service: General;;  . KNEE ARTHROSCOPY Left 1990's  . KNEE ARTHROSCOPY W/ MENISCECTOMY Left 09/14/2009   and chondroplasty debridement  . LAPAROSCOPIC LYSIS OF ADHESIONS  11/17/2015   Procedure: LAPAROSCOPIC  LYSIS OF ADHESIONS;  Surgeon: Michael Boston, MD;  [facility];  Service: General;;  . PORT-A-CATH REMOVAL N/A 11/21/2016   Procedure: REMOVAL PORT-A-CATH;  Surgeon: Michael Boston, MD;  Location: [facility];  Service: General;  Laterality: N/A;  . PORTACATH PLACEMENT N/A 05/25/2015   Procedure: INSERTION PORT-A-CATH;  Surgeon: Michael Boston, MD;  [facility];  Service: General;  Laterality: N/A;-remains inplace Right chest.  . ROTATOR CUFF REPAIR Right 2006  . TONSILLECTOMY  age 40  . XI ROBOTIC ASSISTED LOWER ANTERIOR RESECTION N/A 05/25/2015   Procedure: XI ROBOTIC ASSISTED LOWER ANTERIOR RESECTION, , RIGID PROCTOSCOPY, RIGHT OOPHORECTOMY;  Surgeon: Michael Boston, MD;  [facility];  Service: General;  Laterality: N/A;    There were no vitals filed for this visit.      Subjective Assessment - 12/12/16 1106    Subjective The soft tissue work helped with the soreness in the buttocks. Patient reports pain in sitting decreased by 15% since last visit.    Patient Stated Goals stop drainage, decrease pelvic pain   Currently in Pain? Yes   Pain Score 4    Pain Location Abdomen   Pain Orientation Lower   Pain Descriptors / Indicators Aching   Pain Type Chronic pain   Pain Onset More than a month ago   Pain Frequency Constant   Aggravating Factors  increased movement; end of urination with increased pressure and pain   Pain Relieving Factors after urinating   Multiple Pain Sites No                         [facility] Adult PT Treatment/Exercise - 12/12/16 0001      Lumbar Exercises: Aerobic   Stationary Bike Nustep L2 legs only x 12 min  PTA to monitor     Manual Therapy   Manual Therapy Soft tissue mobilization;Myofascial release   Soft tissue mobilization along the gluteals, piriformis, levator ani, lumbar sacral area, right coccygeus muscle  left sidely   Myofascial Release along the abdominal wall to reduce scar tissue and improve mobility of  the tissue                  PT Short Term Goals - 12/10/16 1143      PT SHORT TERM GOAL #3   Title lower abdominal pain with daily tasks decreased >/= 25%   Time 1   Period Months   Status Achieved     PT SHORT TERM GOAL #4   Title urinary leakage with coughing and sneezing decreased >/= 25%   Time 1   Period Months   Status Achieved     PT SHORT TERM GOAL #5   Title pressure and pain during urination decreased >/= 25%   Time 1   Period Months   Status  Achieved           PT Long Term Goals - 12/10/16 1106      PT LONG TERM GOAL #2   Title pressure and pain with urination decreased >/= 75% due to increased mobility of tissue   Time 2   Period Months   Status On-going  50% better     PT LONG TERM GOAL #3   Title urinary leakage with sneezing and coughing decreased >/= 75% due to increased pelvic floor strength >/= 4/5   Time 2   Period Months   Status On-going  50% better     PT LONG TERM GOAL #4   Title lower abdominal pain with increased activity decreased >/= 75% due to improved tissue mobility   Time 2   Period Months   Status On-going  pain decreased by 60%               Plan - 12/12/16 1106    Clinical Impression Statement Patient reports sitting with 15% less pain after soft tissue work.  While performing soft tissue work on the buttocks she was less sensitive.  Patient had increased trigger points in the abdominal area.  Patient is able to do the nustep for longer period of time.  Patient still has urinary leakage with bending forward.  Patient will benefit from skilled therapy to improve muscle coordination and decrease pain in the buttocks.    Rehab Potential Good   Clinical Impairments Affecting Rehab Potential s/p recta adenocarcinoma; radiation and chemotherapy 02/20/2015 to 03/30/2015; 05/25/2015 recto-sigmoid segmental resection; 05/31/2016 ileostomy take down; 07/12/2016 segmental resection of proimal and distal colon for a  colovaginal fistula   PT Frequency 2x / week   PT Duration 8 weeks   PT Treatment/Interventions Biofeedback;Patient/family education;Scar mobilization;Manual techniques;Manual lymph drainage;Neuromuscular re-education;Balance training;Therapeutic exercise;Therapeutic activities;Energy conservation   PT Next Visit Plan soft tissue work to abdomen and buttocks, along scars, work on abdomen; work on urinary leakage   PT Home Exercise Plan progress as needed   Consulted and Agree with Plan of Care Patient      Patient will benefit from skilled therapeutic intervention in order to improve the following deficits and impairments:  Decreased range of motion, Increased fascial restricitons, Increased muscle spasms, Pain, Decreased activity tolerance, Decreased balance, Decreased scar mobility, Impaired flexibility, Decreased strength, Decreased mobility  Visit Diagnosis: Muscle weakness (generalized)  Other muscle spasm  Unspecified lack of coordination  Aftercare following surgery for neoplasm     Problem List Patient Active Problem List   Diagnosis Date Noted  . Colostomy in place [facility]) 07/12/2016  . Colovaginal fistula s/p omentopexy repair 07/12/2016 07/10/2016  . Rectocutaneous fistula 07/10/2016  . Hydroureter on right 12/20/2015  . Abdominal pain 12/19/2015  . Hypokalemia 11/23/2015  . Colorectal delayed anastomotic leak s/p resection & colostomy 07/12/2016 11/17/2015  . Pelvic abscess s/p drainage & omental pedicle flap 11/17/2015 06/23/2015  . Anemia of chronic disease 06/14/2015  . Hypertension   . IBS (irritable bowel syndrome)   . Depression   . Cavernous hemangioma of liver - segments 5 & 7 02/07/2015  . Osteoarthritis of left knee 02/07/2015  . Crohn's disease ([place]) 02/07/2015  . Rectal adenocarcinoma s/p LAR resection 05/25/2015 01/31/2015  . Pure hypercholesterolemia 02/21/2014  . Morbid obesity with BMI of 40.0-44.9, adult ([place]) 02/21/2014  . Family history of  ischemic heart disease 02/21/2014    Earlie Counts, PT 12/12/16 11:41 AM   [facility] [address]  [address] [place], [place], [address] Phone: [phone]   Fax:  [phone]  Name: Lindsay Little MRN: [id number] Date of Birth: [birth year]

## 2016-12-16 NOTE — Addendum Note (Signed)
Addendum  created 12/16/16 1432 by Myrtie Soman, MD   Sign clinical note

## 2016-12-16 NOTE — Anesthesia Postprocedure Evaluation (Signed)
Anesthesia Post Note  Patient: Lindsay Little  Procedure(s) Performed: Procedure(s) (LRB): REMOVAL PORT-A-CATH (N/A)     Anesthesia Post Evaluation  Last Vitals:  Vitals:   11/21/16 1415 11/21/16 1434  BP: (!) 143/67 (!) 152/71  Pulse: 73 100  Resp: 16 19  Temp:  37.3 C    Last Pain:  Vitals:   11/22/16 1726  TempSrc:   PainSc: 6                  Endiya Klahr S

## 2016-12-20 NOTE — Progress Notes (Signed)
[facility]  Telephone:(336) [phone] Fax:(336) [phone]  Clinic Follow Up Note   Patient Care Team: [name], MD as PCP - General (Family Medicine) Michael Boston, MD as Consulting Physician (General Surgery) Truitt Merle, MD as Consulting Physician (Medical Oncology) Kyung Rudd, MD as Consulting Physician (Radiation Oncology) Teena Irani, MD as Consulting Physician (Gastroenterology) Raynelle Bring, MD as Consulting Physician (Urology) 12/27/2016  CHIEF COMPLAINTS:  Follow up rectal cancer   Oncology History   Rectal adenocarcinoma   Staging form: Colon and Rectum, AJCC 7th Edition     Clinical: T3, N2, M0 - Unsigned       Rectal adenocarcinoma s/p LAR resection 05/25/2015   01/13/2015 Initial Diagnosis    Rectal adenocarcinoma      01/13/2015 Procedure    Colonoscopy showed a sensitivity O nonobstructing mass in the rectum and from 12-18 cm proximal to the Annis. The mass was circumferential, measuring about 6 cm in length. EGD was negative.      01/17/2015 Tumor Marker    CEA 1.4, CA-19-9 8, MMR normal       01/18/2015 Procedure    Lower endoscopic ultrasound by Dr. Paulita Fujita showed a T3 N2 rectal mass.      01/20/2015 Imaging    CT abdomen and pelvis with contrast showed right lateral rectal wall exophytic mass, and 2 ill-defined hepatic hypoenhancing lesions which appears to correspond to the lesion seen on the prior MRI in 2015.      02/05/2015 Imaging    abdomen MRI showed 2 hemangioma, no suspicion for metastatic disease. CT chest was negative      02/20/2015 - 03/30/2015 Radiation Therapy    neoadjuvant RT to rectal cancer       02/20/2015 Concurrent Chemotherapy    capecitabine 2500 mg in the morning and 2000 mg in the evening (824m/m2, bid), on the day of radiation.       05/25/2015 Surgery    Recto-sigmoid segmental resection, margins are negative       05/25/2015 Pathology Results    0.2cm residual invasive adenocarcinoma, G2, negative  margins, 12 nodes were negative       06/23/2015 - 06/29/2015 Hospital Admission    Patient was admitted for pelvic abscess, drain placed and she was treated with IV antibiotics, she also received 1 dose of Feraheme for anemia.      09/15/2015 - 09/17/2015 Hospital Admission    Recurrent pelvic collection s/p perc drainage 09/16/2015      02/21/2016 Imaging    CT abdomen and pelvis w contrast IMPRESSION: 1. Interval removal of surgical drains since 12/19/2015 from the presacral space. Similar size of ill-defined presacral fluid and gas, for which residual abscess cannot be excluded. Similar amount of intraperitoneal edema throughout the upper pelvis with foci of extraluminal gas, possibly related to the presacral process. 2. Diverting right-sided ileostomy, without acute complication. 3. Similar moderate hydroureteronephrosis, likely due to the pelvic process. 4. Possible bladder wall thickening. Correlate with urinalysis. This appearance could be partially due to underdistention. 5. Geographic hepatic steatosis and hemangiomas. Cannot exclude a new right hepatic lobe lesion. Consider nonemergent pre and post contrast outpatient MRI. This could represent a new area of focal steatosis.      05/31/2016 Surgery    ileostomy takedown BY Dr. GJohney Maine      07/04/2016 Imaging    CT Abdomen Pelvis W Contrast 07/04/16 IMPRESSION: Subcutaneous fluid collection at the site of ileostomy takedown, could be sterile or infected, measuring 9.3 x 4.3  x 5.3 cm; this could be aspirated under ultrasound guidance if clinically indicated. Large collection of stool within the presacral space, could potentially be within a distended distorted rectum but since this occupies the same position as the abscess collection identified on the previous CT this is suspicious for a contiguous contained extraluminal stool collection communicating with the rectosigmoid colon, collection overall roughly 7 x 8 x 9 cm.       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       HISTORY OF PRESENTING ILLNESS:  Lindsay Little 51 y.o. female is here because of evaluation for management for newly diagnosed rectal adenocarcinoma.   On 09/06/13, she presented to Dr. Starr [facility]] with 76-monthhistory of diarrhea and worsening abdominal pain. She has a history of chronic abdominal pain and had been previously [place] Dr. HAmedeo Plenty Abdominal UKorea3/6/15 noted to have some hypoechoicity in the left hepatic lobe which was confirmed on MRI to be two hemangiomas  in the right hepatic lobe of measuring up to 3.0 cm along with an additional 7 mm probable cyst.   She presented with worsening of diarrhea for 6 month and was reevaluated by Dr. HAmedeo Plenty She was seen on 12/06/14 at which time it was decided she would undergo HIDA scan given that the pain was intermittently in the RUQ in addition to upper and lower endoscopy. HIDA scan 12/15/14 was reassuring, but she was found to have a malignant tumor of the rectum about 12-18 cm proximal to the anus, gastritis, gastric polyps on endoscopies 01/13/15. Biopsies for these three specimens along with the duodenum were collected and resulted on 01/17/15 for invasive adenocarcinoma, chronic gastritis [H. Pylori negative], fundic gland polyps, and non-specific mild patchy intra-epithelial lymphocytes. Endoscopic UKorea7/6/16 staged the mass at T3N2Mx and she is here today for consideration of neoadjuvant therapy.    Today, she does report abdominal pain but is no different than her baseline [right-sided, upper, lower] but has had worsening appetite over the last 2 months. She does have diarrhea with bowel movements every 1-2 hours, especially after eating, and has been noticing bleeding with her bowel movement following her endoscopic  evaluation. She also feels her energy level has decreased. Otherwise, she denies nausea, chest pain, shortness of breath.    Family history is notable for a maternal uncle who had pancreatic cancer diagnosed in his mid to late 656sthat spread to the colon and prostate before he died about 168years later and a sister with non-melanoma skin cancer. 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 She smoked roughly 20 pack years [1 pack/week x 7 years] in the past and reports occasional alcohol use but denies any prior illicit drug use.  She lost about 7 lbs in the past 2 months  CURRENT THERAPY: Surveillance  INTERIM HISTORY DHilda Bladesreturns for follow-up with her husband. She still has pain in her left lower abdomen due to surgery. She see the PT how often she can. She is taking Tramadol but doesn't help. She is taking more fiber. Denies fever or chills.She has neuropathy in her toes. She is taking Neurontin twice daily.    HISTORY:  Past Medical History:  Diagnosis Date  . Anxiety   . Chemotherapy-induced neuropathy ([place]  toes and fingers numbness and tingling  . Colostomy in place [facility]) 07/12/2016   due to anastomosis breakdown w/ colovaginal fistula  . Complication of anesthesia   . Depression   . Family history of adverse reaction to anesthesia    mother-- ponv  . Gastroenteritis 12/20/2015  . GERD (gastroesophageal reflux disease)   . Hiatal hernia   . History of cancer chemotherapy 02-20-2015 to 03-30-2015  . History of cardiac murmur as a child   . History of chemotherapy   . History of chronic gastritis   . History of hypertension    no issues since multiple abdminal sx's and chemo--- no medication since 12/ 2016  . History of TMJ disorder   . Hypercholesteremia   . IBS (irritable bowel syndrome)    dx age 63  . Intermittent abdominal pain    post-op multiple abdominal sx's   . Microcytic anemia   . Mild sleep apnea     per pt study 2014  very mild osa , no cpap recommended, recommeded wt loss and sleep routine  . OA (osteoarthritis)    left knee /  left shoulder  . Obesity   . PONV (postoperative nausea and vomiting)    severe" needs Scopolamine PATCH"   . Portacath in place    right chest  . Rectal adenocarcinoma ([place]) oncologis-  dr Burr Medico-- after radiation/ chemo (ypT1, N0) --  no recurrence per last note 03/ 2018   dx 01-13-2015-- Stage IIIC (T3, N2, M0) post concurrent radiation and chemotherapy 02-20-2015 to 03-30-2015 /  05-25-2015 s/p  LAR w/ RSO (post-op complicated by late abscess and contained anatomotic leak w/ help percutaneous drainage and antibiotics)   . Rotator cuff tear, left   . S/P radiation therapy 02/20/15-03/30/15   colon/rectal  . Vitamin D deficiency   . Wears glasses   . Wears glasses     SURGICAL HISTORY: Past Surgical History:  Procedure Laterality Date  . ABDOMINAL HYSTERECTOMY  1996   uterus and cervix  . COLON RESECTION N/A 07/12/2016   Procedure: LAPAROSCOPIC LYSIS OF ADHESIONS, OMENTOPEXY, HAND ASSISTED RESECTION OF  COLON, END TO END ANASTOMOSIS, COLOSTOMY;  Surgeon: Michael Boston, MD;  Location: [facility];  Service: General;  Laterality: N/A;  . COMBINED HYSTEROSCOPY DIAGNOSTIC / D&C  x2 1990's  . DIAGNOSTIC LAPAROSCOPY  age 45 and age 71  . EUS N/A 01/18/2015   Procedure: LOWER ENDOSCOPIC ULTRASOUND (EUS);  Surgeon: Arta Silence, MD;  Location: [facility];  Service: Endoscopy;  Laterality: N/A;  . EXCISION OF SKIN TAG  11/17/2015   Procedure: EXCISION OF SKIN TAG;  Surgeon: Michael Boston, MD;  Location: [facility];  Service: General;;  . ILEO LOOP COLOSTOMY CLOSURE N/A 11/17/2015   Procedure: LAPAROSCOPIC DIVERTING LOOP ILEOSTOMY  DRAINAGE OF PELVIC ABSCESS;  Surgeon: Michael Boston, MD;  Location: [facility];  Service: General;  Laterality: N/A;  . ILEOSTOMY CLOSURE N/A 05/31/2016   Procedure: TAKEDOWN LOOP ILEOSTOMY;  Surgeon: Michael Boston, MD;  Location: [facility];  Service: General;   Laterality: N/A;  . IMPACTION REMOVAL  11/17/2015   Procedure: DISIMPACTION REMOVAL;  Surgeon: Michael Boston, MD;  Location: [facility];  Service: General;;  . KNEE ARTHROSCOPY Left 1990's  . KNEE ARTHROSCOPY W/ MENISCECTOMY Left 09/14/2009   and chondroplasty debridement  . LAPAROSCOPIC LYSIS OF ADHESIONS  11/17/2015   Procedure: LAPAROSCOPIC LYSIS OF ADHESIONS;  Surgeon: Michael Boston, MD;  Location: [facility];  Service: General;;  . PORT-A-CATH REMOVAL N/A 11/21/2016   Procedure: REMOVAL  PORT-A-CATH;  Surgeon: Michael Boston, MD;  Location: [facility] [place];  Service: General;  Laterality: N/A;  . PORTACATH PLACEMENT N/A 05/25/2015   Procedure: INSERTION PORT-A-CATH;  Surgeon: Michael Boston, MD;  Location: [facility];  Service: General;  Laterality: N/A;-remains inplace Right chest.  . ROTATOR CUFF REPAIR Right 2006  . TONSILLECTOMY  age 68  . XI ROBOTIC ASSISTED LOWER ANTERIOR RESECTION N/A 05/25/2015   Procedure: XI ROBOTIC ASSISTED LOWER ANTERIOR RESECTION, , RIGID PROCTOSCOPY, RIGHT OOPHORECTOMY;  Surgeon: Michael Boston, MD;  Location: [facility];  Service: General;  Laterality: N/A;    SOCIAL HISTORY: Social History   Social History  . Marital status: Married    Spouse name: N/A  . Number of children: N/A  . Years of education: N/A   Occupational History  . Not on file.   Social History Main Topics  . Smoking status: Former Smoker    Packs/day: 0.25    Years: 7.00    Types: Cigarettes    Quit date: 07/16/1991  . Smokeless tobacco: Never Used  . Alcohol use No  . Drug use: No  . Sexual activity: Yes    Birth control/ protection: Surgical   Other Topics Concern  . Not on file   Social History Narrative   Tobacco Use: Cigarettes - Former Smoker   Alcohol: Yes, very rare, liquor.    No recreational drug use   Occupation: Head CMA @ [employer]   Marital Status: Married    Husband: Roselyn Reef Disabled   Children: 2 adopted kids [place]   Religion: [employer] in  [place] HISTORY: Family History  Problem Relation Age of Onset  . Coronary artery disease Mother 93  . Hypertension Mother   . Hyperlipidemia Mother   . Diabetes Mellitus I Mother   . Coronary artery disease Father   . Hyperlipidemia Father   . Hypertension Father   . Cancer Sister        skin - non melanoma  . Hyperlipidemia Brother   . Cancer Maternal Uncle 10       pancreatic with mets to colon and prostate  . Cirrhosis Maternal Uncle   . Hypertension Maternal Grandmother   . Diabetes Mellitus I Maternal Grandmother   . Hyperlipidemia Maternal Grandmother   . CVA Maternal Grandmother   . Hypertension Maternal Grandfather   . Coronary artery disease Maternal Grandfather 65  . Hyperlipidemia Maternal Grandfather   . Coronary artery disease Paternal Grandmother   . Hypertension Paternal Grandmother   . Hyperlipidemia Paternal Grandmother   . Diabetes Mellitus I Paternal Grandmother   . Hypertension Paternal Grandfather   . Hyperlipidemia Paternal Grandfather   . Coronary artery disease Paternal Grandfather     ALLERGIES:  is allergic to caine-1 [lidocaine]; flagyl [metronidazole]; adhesive [tape]; iron; oxycodone; penicillins; and sulfa antibiotics.  MEDICATIONS:  Current Outpatient Prescriptions  Medication Sig Dispense Refill  . Cholecalciferol (VITAMIN D3) 5000 units CAPS Take 1 capsule by mouth daily.    . Coenzyme Q10 (COQ-10) 100 MG CAPS Take 100 mg by mouth every evening.     . diphenoxylate-atropine (LOMOTIL) 2.5-0.025 MG tablet TAKE ONE TABLET BY MOUTH FOUR TIMES DAILY AS NEEDED FOR DIARRHEA 30 tablet 1  . DULoxetine (CYMBALTA) 60 MG capsule Take 60 mg by mouth at bedtime.     Marland Kitchen esomeprazole (NEXIUM) 40 MG capsule Take 1 capsule (40 mg  total) by mouth 2 (two) times daily before a meal. 60 capsule 5  . gabapentin (NEURONTIN) 100 MG capsule Take 200 mg by mouth 2 (two) times daily.  0  . hyoscyamine (LEVBID) 0.375 MG 12 hr tablet  Take 0.375 mg by mouth 2 (two) times daily.     Marland Kitchen lidocaine-prilocaine (EMLA) cream Apply to affected area once before port access (Patient taking differently: Apply 1 application topically See admin instructions. Apply to affected area once before port access) 30 g 3  . Melatonin 5 MG CAPS Take 5 mg by mouth at bedtime.     . meloxicam (MOBIC) 15 MG tablet Take 15 mg by mouth every evening.     . methocarbamol (ROBAXIN) 500 MG tablet Take 2 tablets (1,000 mg total) by mouth every 6 (six) hours as needed for muscle spasms. (Patient taking differently: Take 500-1,000 mg by mouth every 8 (eight) hours as needed for muscle spasms. ) 20 tablet 3  . ondansetron (ZOFRAN) 8 MG tablet Take 1 tablet (8 mg total) by mouth 2 (two) times daily as needed for nausea or vomiting. Start the day after chemo for 2 days. Then take as needed for nausea or vomiting. 30 tablet 2  . promethazine (PHENERGAN) 25 MG tablet Take 25 mg by mouth every 8 (eight) hours as needed for nausea or vomiting.     . traMADol (ULTRAM) 50 MG tablet Take 50 mg by mouth every 6 (six) hours as needed.    Marland Kitchen HYDROcodone-acetaminophen (NORCO/VICODIN) 5-325 MG tablet Take 1 tablet by mouth every 6 (six) hours as needed for moderate pain. 30 tablet 0   No current facility-administered medications for this visit.    Facility-Administered Medications Ordered in Other Visits  Medication Dose Route Frequency Provider Last Rate Last Dose  . 0.9 %  sodium chloride infusion   Intravenous Once Truitt Merle, MD        REVIEW OF SYSTEMS:  Constitutional: Denies fevers, chills or abnormal night sweats (+) Fatigue Eyes: Denies blurriness of vision, double vision or watery eyes Ears, nose, mouth, throat, and face: Denies mucositis or sore throat Respiratory: Denies cough, dyspnea or wheezes Cardiovascular: Denies palpitation, chest discomfort or lower extremity swelling Gastrointestinal:(+) lower abdominal pain  Skin: Denies abnormal skin  rashes Lymphatics: Denies new lymphadenopathy or easy bruising Neurological:Denies numbness, tingling or new weaknesses Behavioral/Psych: Mood is stable, no new changes  All other systems were reviewed with the patient and are negative.  PHYSICAL EXAMINATION:  ECOG PERFORMANCE STATUS: 2  Vitals:   12/27/16 0944  BP: 138/78  Pulse: 70  Resp: 18  Temp: 98.3 F (36.8 C)   Filed Weights   12/27/16 0944  Weight: (!) 314 lb 9.6 oz (142.7 kg)    GENERAL:alert, no distress and comfortable SKIN: skin color, texture, turgor are normal, no rashes or significant lesions EYES: normal, conjunctiva are pink and non-injected, sclera clear OROPHARYNX:no exudate, no erythema and lips, buccal mucosa, and tongue normal  NECK: supple, thyroid normal size, non-tender, without nodularity LYMPH:  no palpable lymphadenopathy in the cervical, axillary or inguinal LUNGS: clear to auscultation and percussion with normal breathing effort HEART: regular rate & rhythm and no murmurs and no lower extremity edema ABDOMEN:abdomen soft, normal bowel sounds. No organomegaly. No ascites. (+) Multiple scars in the abdomen and all well healed. A wound in the suprapubic area covered by gauze. Tenderness beneath her scar. MSK: No cyanosis of digits and no clubbing  PSYCH: alert & oriented x 3 with fluent speech  NEURO: no focal motor/sensory deficits   LABORATORY DATA:  I have reviewed the data as listed CBC Latest Ref Rng & Units 12/25/2016 11/21/2016 09/24/2016  WBC 3.9 - 10.3 10e3/uL 6.7 - 6.8  Hemoglobin 11.6 - 15.9 g/dL 12.2 12.7 12.2  Hematocrit 34.8 - 46.6 % 38.6 - 38.6  Platelets 145 - 400 10e3/uL 239 - 292    CMP Latest Ref Rng & Units 12/25/2016 09/24/2016 07/17/2016  Glucose 70 - 140 mg/dl 88 99 -  BUN 7.0 - 26.0 mg/dL 13.3 14.3 -  Creatinine 0.6 - 1.1 mg/dL 0.7 0.8 0.65  Sodium 136 - 145 mEq/L 143 142 -  Potassium 3.5 - 5.1 mEq/L 4.2 4.0 3.6  Chloride 101 - 111 mmol/L - - -  CO2 22 - 29 mEq/L 24 25 -   Calcium 8.4 - 10.4 mg/dL 9.7 10.1 -  Total Protein 6.4 - 8.3 g/dL 7.1 7.6 -  Total Bilirubin 0.20 - 1.20 mg/dL 0.36 0.37 -  Alkaline Phos 40 - 150 U/L 101 86 -  AST 5 - 34 U/L 15 13 -  ALT 0 - 55 U/L 18 14 -   Results for SYMPHONY, DEMURO (MRN [id number]) as of 12/27/2016 09:43  Ref. Range 09/24/2016 08:55 12/25/2016 09:15  CEA ([facility]) Latest Ref Range: 0.00 - 5.00 ng/mL 1.34 1.42   PATHOLOGY REPORT  Diagnosis 07/12/16 1. Colon, segmental resection, Proximal - FOCAL MESENTERIC AND SUBSEROSAL FIBROSIS WITH ASSOCIATED HEMORRHAGE. - TWO BENIGN REACTIVE LYMPH NODES. - NO EVIDENCE OF MALIGNANCY. 2. Colon, segmental resection, Distal - ULCERATION WITH INFLAMMATION AND INFLAMED GRANULATION TISSUE WITH FOREIGN BODY GIANT CELL REACTION, CLINICALLY COLOVAGINAL FISTULA. - NO EVIDENCE OF MALIGNANCY. Claudette Laws MD Pathologist, Electronic Signature (Case signed 07/16/2016)  Diagnosis 05/25/2015 1. Colon, segmental resection for tumor, recto-sigmoid INVASIVE COLONIC ADENOCARCINOMA (0.2 CM IN GREATEST DIMENSION RESIDUAL TUMOR) THE TUMOR INVADE SUBMUCOSA (0.3 CM IN DEPTH, PT1) MARGINS OF RESECTION ARE NEGATIVE FOR TUMOR TWELVE BENIGN LYMPH NODES (0/12) 2. Colon, resection margin (donut), proximal anastomotic ring BENIGN COLONIC TISSUE, NEGATIVE FOR MALIGANCY 3. Colon, resection margin (donut), distal anastomotic ring BENIGN COLONIC TISSUE, NEGATIVE FOR MALIGNANCY 4. Ovary, right BENIGN SEROUS CYSTADENOMA.  Microscopic Comment 1. COLON AND RECTUM (INCLUDING TRANS-ANAL RESECTION): Specimen: Colon-rectum Procedure: Segmental resection Tumor site: Rectum anterior wall mid to distal third of rectum Specimen integrity: intact Macroscopic intactness of mesorectum: Complete: _ Macroscopic tumor perforation: Negative Invasive tumor: Maximum size: Histologic type(s): G2 Histologic grade and differentiation: G1: well differentiated/low grade G2: moderately differentiated/low  grade G3: poorly differentiated/high grade G4: undifferentiated/high grade Type of polyp in which invasive carcinoma arose: _ Microscopic extension of invasive tumor:Submucosa Lymph-Vascular invasion: Not identified Peri-neural invasion: negative Tumor deposit(s) (discontinuous extramural extension): Negative Resection margins: Proximal margin: Negative Distal margin: Negative Circumferential (radial) (posterior ascending, posterior descending; lateral and posterior mid-rectum; and entire lower 1/3 rectum):Negatvie Mesenteric margin (sigmoid and transverse): Negative Distance closest margin (if all above margins negative): 2.5 cm Treatment effect (neo-adjuvant therapy): Present Additional polyp(s): Negative Non-neoplastic findings: Unremarkable Lymph nodes: number examined 12; number positive: 0 Pathologic Staging: T1, N0, M_ Ancillary studies: Preserved expression of the makor and minor MMR proteins MLH1, MSH2, MSH6 and PMS2. MLH1:    RADIOGRAPHIC STUDIES: I have personally reviewed the radiological images as listed and agreed with the findings in the report.  CT chest, Abdomen Pelvis W Contrast 12/26/2016 IMPRESSION: 1. Stable CT exam of the chest.  No evidence for metastatic disease. 2. Interval left colectomy with left abdominal end colostomy. Presacral soft tissue  is similar to prior study when taking into account the interval surgery. 3. Hepatic dome lesion seen on the previous study not evident today, potentially related to bolus timing. The 2.4 cm subcapsular inferior right liver lesion remains subtle and is barely visible on today's exam but shows no substantial interval change in size. Continued attention on follow-up recommended.    ASSESSMENT & PLAN: Ms. Edmonston is a 51 y.o. female with chronic abdominal pain with rectal mass on colonoscopy found to be invasive rectal adenocarcinoma.   1. T3N2M0, Stage IIIC rectal adenocarcinoma, ypT1N0 after neoadjuvant  chemotherapy and radiation -I previously reviewed her surgical pathology result in great detail with patient and her husband. She has had a good response to neoadjuvant chemoradiation, has a small residual tumor on the surgical specimen, no lymph node metastasis. -I recommended adjuvant chemotherapy, however she has not been able to take it due to the pelvic abscess after surgery and a prolonged recovery. - We'll continue her surveillance. She had multiple CT of the abdomen and pelvis after surgery, last one in August 2017, which did not how evidence of recurrence. Her tumor marker CEA has been normal. -The patient had a colostomy revision on 05/31/16, she had a colonoscopy prior to this, which was unremarkable except a few polyps. -We discussed her CT AP scan on 07/04/16 showing subcutaneous fluid collection at the site of ileostomy takedown and a large collection of stool within the presacral space. No evidence of cancer recurrence. 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 -She has overall improved lately, and IV fluids are not required at this time. -I reviewed her lab results with her, both CBC and CMP are unremarkable, she is clinically doing better, no clinical concern of cancer recurrence.  -I reviewed her surveillance CT scan from 12/26/2016, which showed no evidence of recurrence. Her liver lesions are stable, previously evaluated by MRI, which was consistent with hemangioma.  -We'll continue colon cancer surveillance for 5 years - follow up in 5 months - repeat scan in 1 year   2. Intermittent abdominal pain and diarrhea  -She unfortunately developed a recurrent pelvic abscess after her surgery,  managed by her surgeon Dr. Johney Maine, she is status post ileostomy which was changed to permanent colostomy. She still has moderate low abdomen pain, possible related to surgical scars from his previously multiple surgery and  chewing tube placement.  -She will continue physical therapy -her diarrhea has resolved after permanent colostomy. - I recommended she see a pain specialist, she denied at this time - I prescribed Vicodin today (6/15)  3. Microcytic anemia, secondary to GI blood loss and iron deficiency anemia -improved anemia since surgery. -We'll continue monitoring her iron level  4. HTN -She'll continue follow-up with her primary care physician  6.  Anorexia and depression -She tried mirtazapine, did not help. - I previously encouraged her to take nutrition supplement -Overall improved lately.  7. Fatigue -I previously advised the patient that exercise could help with fatigue. We discussed that a referral with PT could help her with this. -Naps during the afternoon are appropriate.   Plan: - I prescribed Vicodin today, I discourage her to use for long term  -Follow up in 5 months   I spent 30 mins for the visit, >50% face to face discussion.   Truitt Merle  12/27/2016   This document serves as a record of services personally performed by Truitt Merle, MD. It was created  on her behalf by Brandt Loosen, a trained medical scribe. The creation of this record is based on the scribe's personal observations and the provider's statements to them. This document has been checked and approved by the attending provider.

## 2016-12-23 ENCOUNTER — Ambulatory Visit: Payer: No Typology Code available for payment source | Attending: Surgery | Admitting: Physical Therapy

## 2016-12-23 DIAGNOSIS — Z483 Aftercare following surgery for neoplasm: Secondary | ICD-10-CM

## 2016-12-23 DIAGNOSIS — M62838 Other muscle spasm: Secondary | ICD-10-CM | POA: Diagnosis present

## 2016-12-23 DIAGNOSIS — R279 Unspecified lack of coordination: Secondary | ICD-10-CM | POA: Diagnosis present

## 2016-12-23 DIAGNOSIS — M6281 Muscle weakness (generalized): Secondary | ICD-10-CM | POA: Diagnosis not present

## 2016-12-23 NOTE — Patient Instructions (Addendum)
Cough: Phase 2 (Supine)    Lie flat. Squeeze pelvic floor and hold. Inhale. Lift head and shoulders. Exhale. Relax. Repeat __10_ times. Do 2___ times a day.   Copyright  VHI. All rights reserved.  [facility] [address] [place], [place] [address] Phone # [phone] Fax [phone]

## 2016-12-23 NOTE — Therapy (Signed)
[facility] [address], [place], [place], [address] Phone: [phone]   Fax:  [phone]  Physical Therapy Treatment  Patient Details  Name: Lindsay Little MRN: [id number] Date of Birth: 01-29-1966 Referring Provider: Dr. Michael Boston  Encounter Date: 12/23/2016      PT End of Session - 12/23/16 1445    Visit Number 9   Date for PT Re-Evaluation 01/13/17   Authorization Type [employer]   PT Start Time 1445   PT Stop Time 1525   PT Time Calculation (min) 40 min   Activity Tolerance Patient tolerated treatment well   Behavior During Therapy [facility] for tasks assessed/performed      Past Medical History:  Diagnosis Date  . Anxiety   . Chemotherapy-induced neuropathy (HCC)    toes and fingers numbness and tingling  . Colostomy in place [facility]) 07/12/2016   due to anastomosis breakdown w/ colovaginal fistula  . Complication of anesthesia   . Depression   . Family history of adverse reaction to anesthesia    mother-- ponv  . Gastroenteritis 12/20/2015  . GERD (gastroesophageal reflux disease)   . Hiatal hernia   . History of cancer chemotherapy 02-20-2015 to 03-30-2015  . History of cardiac murmur as a child   . History of chemotherapy   . History of chronic gastritis   . History of hypertension    no issues since multiple abdminal sx's and chemo--- no medication since 12/ 2016  . History of TMJ disorder   . Hypercholesteremia   . IBS (irritable bowel syndrome)    dx age 51  . Intermittent abdominal pain    post-op multiple abdominal sx's   . Microcytic anemia   . Mild sleep apnea    per pt study 2014  very mild osa , no cpap recommended, recommeded wt loss and sleep routine  . OA (osteoarthritis)    left knee /  left shoulder  . Obesity   . PONV (postoperative nausea and vomiting)    severe" needs Scopolamine PATCH"   . Portacath in place    right chest  . Rectal adenocarcinoma ([place]) oncologis-  dr  Burr Medico-- after radiation/ chemo (ypT1, N0) --  no recurrence per last note 03/ 2018   dx 01-13-2015-- Stage IIIC (T3, N2, M0) post concurrent radiation and chemotherapy 02-20-2015 to 03-30-2015 /  05-25-2015 s/p  LAR w/ RSO (post-op complicated by late abscess and contained anatomotic leak w/ help percutaneous drainage and antibiotics)   . Rotator cuff tear, left   . S/P radiation therapy 02/20/15-03/30/15   colon/rectal  . Vitamin D deficiency   . Wears glasses   . Wears glasses     Past Surgical History:  Procedure Laterality Date  . ABDOMINAL HYSTERECTOMY  1996   uterus and cervix  . COLON RESECTION N/A 07/12/2016   Procedure: LAPAROSCOPIC LYSIS OF ADHESIONS, OMENTOPEXY, HAND ASSISTED RESECTION OF  COLON, END TO END ANASTOMOSIS, COLOSTOMY;  Surgeon: Michael Boston, MD;  [facility];  Service: General;  Laterality: N/A;  . COMBINED HYSTEROSCOPY DIAGNOSTIC / D&C  x2 1990's  . DIAGNOSTIC LAPAROSCOPY  age 30 and age 33  . EUS N/A 01/18/2015   Procedure: LOWER ENDOSCOPIC ULTRASOUND (EUS);  Surgeon: Arta Silence, MD;  Location: [facility];  Service: Endoscopy;  Laterality: N/A;  . EXCISION OF SKIN TAG  11/17/2015   Procedure: EXCISION OF SKIN TAG;  Surgeon: Michael Boston, MD;  [facility];  Service: General;;  . ILEO LOOP COLOSTOMY  CLOSURE N/A 11/17/2015   Procedure: LAPAROSCOPIC DIVERTING LOOP ILEOSTOMY  DRAINAGE OF PELVIC ABSCESS;  Surgeon: Michael Boston, MD;  [facility];  Service: General;  Laterality: N/A;  . ILEOSTOMY CLOSURE N/A 05/31/2016   Procedure: TAKEDOWN LOOP ILEOSTOMY;  Surgeon: Michael Boston, MD;  [facility];  Service: General;  Laterality: N/A;  . IMPACTION REMOVAL  11/17/2015   Procedure: DISIMPACTION REMOVAL;  Surgeon: Michael Boston, MD;  [facility];  Service: General;;  . KNEE ARTHROSCOPY Left 1990's  . KNEE ARTHROSCOPY W/ MENISCECTOMY Left 09/14/2009   and chondroplasty debridement  . LAPAROSCOPIC LYSIS OF ADHESIONS  11/17/2015   Procedure: LAPAROSCOPIC  LYSIS OF ADHESIONS;  Surgeon: Michael Boston, MD;  [facility];  Service: General;;  . PORT-A-CATH REMOVAL N/A 11/21/2016   Procedure: REMOVAL PORT-A-CATH;  Surgeon: Michael Boston, MD;  Location: [facility];  Service: General;  Laterality: N/A;  . PORTACATH PLACEMENT N/A 05/25/2015   Procedure: INSERTION PORT-A-CATH;  Surgeon: Michael Boston, MD;  [facility];  Service: General;  Laterality: N/A;-remains inplace Right chest.  . ROTATOR CUFF REPAIR Right 2006  . TONSILLECTOMY  age 56  . XI ROBOTIC ASSISTED LOWER ANTERIOR RESECTION N/A 05/25/2015   Procedure: XI ROBOTIC ASSISTED LOWER ANTERIOR RESECTION, , RIGID PROCTOSCOPY, RIGHT OOPHORECTOMY;  Surgeon: Michael Boston, MD;  [facility];  Service: General;  Laterality: N/A;    There were no vitals filed for this visit.      Subjective Assessment - 12/23/16 1448    Subjective I have my CT this week and I wll see Dr. Johney Maine afterwards. My disability has been extended. I have had more days of pain lately due to doing more.    Patient Stated Goals stop drainage, decrease pelvic pain   Currently in Pain? Yes   Pain Score 10-Worst pain ever   Pain Location Abdomen   Pain Orientation Lower   Pain Descriptors / Indicators Aching   Pain Type Chronic pain   Pain Onset More than a month ago   Pain Frequency Constant   Aggravating Factors  increased movement; end of urination with increased pressure and pain   Pain Relieving Factors after urinating   Multiple Pain Sites No                         [facility] Adult PT Treatment/Exercise - 12/23/16 0001      Lumbar Exercises: Aerobic   Stationary Bike Nustep L2 only x 12 min  PTA to monitor     Manual Therapy   Manual Therapy Soft tissue mobilization;Myofascial release   Soft tissue mobilization along the gluteals, piriformis, levator ani, lumbar sacral area, right coccygeus muscle  left sidely   Myofascial Release along the abdominal wall to reduce scar  tissue and improve mobility of the tissue                PT Education - 12/23/16 1529    Education provided Yes   Education Details pelvic floor contraction with exhaling    Person(s) Educated Patient   Methods Explanation;Demonstration;Verbal cues;Handout   Comprehension Verbalized understanding;Returned demonstration          PT Short Term Goals - 12/10/16 1143      PT SHORT TERM GOAL #3   Title lower abdominal pain with daily tasks decreased >/= 25%   Time 1   Period Months   Status Achieved     PT SHORT TERM GOAL #4   Title  urinary leakage with coughing and sneezing decreased >/= 25%   Time 1   Period Months   Status Achieved     PT SHORT TERM GOAL #5   Title pressure and pain during urination decreased >/= 25%   Time 1   Period Months   Status Achieved           PT Long Term Goals - 12/10/16 1106      PT LONG TERM GOAL #2   Title pressure and pain with urination decreased >/= 75% due to increased mobility of tissue   Time 2   Period Months   Status On-going  50% better     PT LONG TERM GOAL #3   Title urinary leakage with sneezing and coughing decreased >/= 75% due to increased pelvic floor strength >/= 4/5   Time 2   Period Months   Status On-going  50% better     PT LONG TERM GOAL #4   Title lower abdominal pain with increased activity decreased >/= 75% due to improved tissue mobility   Time 2   Period Months   Status On-going  pain decreased by 60%               Plan - 12/23/16 1446    Clinical Impression Statement Patient has less pain in upper abdomen.  Patient has trouble with lower abdominal pain when she does too musch activity. No change with urination with sneezing and coughing. Patient is intermitttent instead of constant. Patient has increased pressure when she is hurting but not when she has pain. Patient will benfit from skilled therapy  to improve muscle coordination and decrease pain in the buttocks.    Rehab  Potential Good   Clinical Impairments Affecting Rehab Potential s/p recta adenocarcinoma; radiation and chemotherapy 02/20/2015 to 03/30/2015; 05/25/2015 recto-sigmoid segmental resection; 05/31/2016 ileostomy take down; 07/12/2016 segmental resection of proimal and distal colon for a colovaginal fistula   PT Frequency 2x / week   PT Duration 8 weeks   PT Treatment/Interventions Biofeedback;Patient/family education;Scar mobilization;Manual techniques;Manual lymph drainage;Neuromuscular re-education;Balance training;Therapeutic exercise;Therapeutic activities;Energy conservation   PT Next Visit Plan soft tissue work to abdomen and buttocks, along scars, work on abdomen; work on urinary leakage Internal work and how to dilator   PT [place] progress as needed   Consulted and Agree with Plan of Care Patient      Patient will benefit from skilled therapeutic intervention in order to improve the following deficits and impairments:  Decreased range of motion, Increased fascial restricitons, Increased muscle spasms, Pain, Decreased activity tolerance, Decreased balance, Decreased scar mobility, Impaired flexibility, Decreased strength, Decreased mobility  Visit Diagnosis: Muscle weakness (generalized)  Other muscle spasm  Unspecified lack of coordination  Aftercare following surgery for neoplasm     Problem List Patient Active Problem List   Diagnosis Date Noted  . Colostomy in place [facility]) 07/12/2016  . Colovaginal fistula s/p omentopexy repair 07/12/2016 07/10/2016  . Rectocutaneous fistula 07/10/2016  . Hydroureter on right 12/20/2015  . Abdominal pain 12/19/2015  . Hypokalemia 11/23/2015  . Colorectal delayed anastomotic leak s/p resection & colostomy 07/12/2016 11/17/2015  . Pelvic abscess s/p drainage & omental pedicle flap 11/17/2015 06/23/2015  . Anemia of chronic disease 06/14/2015  . Hypertension   . IBS (irritable bowel syndrome)   . Depression   . Cavernous hemangioma  of liver - segments 5 & 7 02/07/2015  . Osteoarthritis of left knee 02/07/2015  . Crohn's disease ([place]) 02/07/2015  .  Rectal adenocarcinoma s/p LAR resection 05/25/2015 01/31/2015  . Pure hypercholesterolemia 02/21/2014  . Morbid obesity with BMI of 40.0-44.9, adult ([place]) 02/21/2014  . Family history of ischemic heart disease 02/21/2014   Earlie Counts, PT 12/23/16 3:31 PM   [facility] [address], [place], [place], [address] Phone: [phone]   Fax:  [phone]  Name: Lindsay Little MRN: [id number] Date of Birth: February 21, 1966

## 2016-12-25 ENCOUNTER — Ambulatory Visit: Payer: No Typology Code available for payment source | Admitting: Physical Therapy

## 2016-12-25 ENCOUNTER — Encounter: Payer: Self-pay | Admitting: Physical Therapy

## 2016-12-25 ENCOUNTER — Other Ambulatory Visit (HOSPITAL_BASED_OUTPATIENT_CLINIC_OR_DEPARTMENT_OTHER): Payer: No Typology Code available for payment source

## 2016-12-25 DIAGNOSIS — E860 Dehydration: Secondary | ICD-10-CM | POA: Diagnosis not present

## 2016-12-25 DIAGNOSIS — M6281 Muscle weakness (generalized): Secondary | ICD-10-CM | POA: Diagnosis not present

## 2016-12-25 DIAGNOSIS — C20 Malignant neoplasm of rectum: Secondary | ICD-10-CM

## 2016-12-25 DIAGNOSIS — R279 Unspecified lack of coordination: Secondary | ICD-10-CM

## 2016-12-25 DIAGNOSIS — M62838 Other muscle spasm: Secondary | ICD-10-CM

## 2016-12-25 DIAGNOSIS — Z483 Aftercare following surgery for neoplasm: Secondary | ICD-10-CM

## 2016-12-25 LAB — CBC WITH DIFFERENTIAL/PLATELET
BASO%: 0.4 % (ref 0.0–2.0)
Basophils Absolute: 0 10*3/uL (ref 0.0–0.1)
EOS%: 3.1 % (ref 0.0–7.0)
Eosinophils Absolute: 0.2 10*3/uL (ref 0.0–0.5)
HEMATOCRIT: 38.6 % (ref 34.8–46.6)
HEMOGLOBIN: 12.2 g/dL (ref 11.6–15.9)
LYMPH#: 1.2 10*3/uL (ref 0.9–3.3)
LYMPH%: 17.8 % (ref 14.0–49.7)
MCH: 27.8 pg (ref 25.1–34.0)
MCHC: 31.6 g/dL (ref 31.5–36.0)
MCV: 87.9 fL (ref 79.5–101.0)
MONO#: 0.4 10*3/uL (ref 0.1–0.9)
MONO%: 6 % (ref 0.0–14.0)
NEUT#: 4.9 10*3/uL (ref 1.5–6.5)
NEUT%: 72.7 % (ref 38.4–76.8)
Platelets: 239 10*3/uL (ref 145–400)
RBC: 4.39 10*6/uL (ref 3.70–5.45)
RDW: 13.9 % (ref 11.2–14.5)
WBC: 6.7 10*3/uL (ref 3.9–10.3)

## 2016-12-25 LAB — COMPREHENSIVE METABOLIC PANEL
ALBUMIN: 3.8 g/dL (ref 3.5–5.0)
ALK PHOS: 101 U/L (ref 40–150)
ALT: 18 U/L (ref 0–55)
ANION GAP: 10 meq/L (ref 3–11)
AST: 15 U/L (ref 5–34)
BUN: 13.3 mg/dL (ref 7.0–26.0)
CALCIUM: 9.7 mg/dL (ref 8.4–10.4)
CHLORIDE: 109 meq/L (ref 98–109)
CO2: 24 mEq/L (ref 22–29)
CREATININE: 0.7 mg/dL (ref 0.6–1.1)
EGFR: >90 mL/min/{1.73_m2} (ref >90)
Glucose: 88 mg/dl (ref 70–140)
POTASSIUM: 4.2 meq/L (ref 3.5–5.1)
Sodium: 143 mEq/L (ref 136–145)
TOTAL PROTEIN: 7.1 g/dL (ref 6.4–8.3)
Total Bilirubin: 0.36 mg/dL (ref 0.20–1.20)

## 2016-12-25 LAB — MAGNESIUM: Magnesium: 2.1 mg/dl (ref 1.5–2.5)

## 2016-12-25 LAB — CEA (IN HOUSE-CHCC): CEA (CHCC-IN HOUSE): 1.42 ng/mL (ref 0.00–5.00)

## 2016-12-25 NOTE — Therapy (Signed)
[facility] [address], [place], [place], [address] Phone: [phone]   Fax:  [phone]  Physical Therapy Treatment  Patient Details  Name: Lindsay Little MRN: [id number] Date of Birth: 1966-03-12 Referring Provider: Dr. Michael Boston  Encounter Date: 12/25/2016      PT End of Session - 12/25/16 1221    Visit Number 10   Date for PT Re-Evaluation 01/13/17   Authorization Type [employer]   PT Start Time 1123   PT Stop Time 1220   PT Time Calculation (min) 57 min   Activity Tolerance Patient tolerated treatment well   Behavior During Therapy [facility] for tasks assessed/performed      Past Medical History:  Diagnosis Date  . Anxiety   . Chemotherapy-induced neuropathy (HCC)    toes and fingers numbness and tingling  . Colostomy in place [facility]) 07/12/2016   due to anastomosis breakdown w/ colovaginal fistula  . Complication of anesthesia   . Depression   . Family history of adverse reaction to anesthesia    mother-- ponv  . Gastroenteritis 12/20/2015  . GERD (gastroesophageal reflux disease)   . Hiatal hernia   . History of cancer chemotherapy 02-20-2015 to 03-30-2015  . History of cardiac murmur as a child   . History of chemotherapy   . History of chronic gastritis   . History of hypertension    no issues since multiple abdminal sx's and chemo--- no medication since 12/ 2016  . History of TMJ disorder   . Hypercholesteremia   . IBS (irritable bowel syndrome)    dx age 35  . Intermittent abdominal pain    post-op multiple abdominal sx's   . Microcytic anemia   . Mild sleep apnea    per pt study 2014  very mild osa , no cpap recommended, recommeded wt loss and sleep routine  . OA (osteoarthritis)    left knee /  left shoulder  . Obesity   . PONV (postoperative nausea and vomiting)    severe" needs Scopolamine PATCH"   . Portacath in place    right chest  . Rectal adenocarcinoma ([place]) oncologis-  dr  Burr Medico-- after radiation/ chemo (ypT1, N0) --  no recurrence per last note 03/ 2018   dx 01-13-2015-- Stage IIIC (T3, N2, M0) post concurrent radiation and chemotherapy 02-20-2015 to 03-30-2015 /  05-25-2015 s/p  LAR w/ RSO (post-op complicated by late abscess and contained anatomotic leak w/ help percutaneous drainage and antibiotics)   . Rotator cuff tear, left   . S/P radiation therapy 02/20/15-03/30/15   colon/rectal  . Vitamin D deficiency   . Wears glasses   . Wears glasses     Past Surgical History:  Procedure Laterality Date  . ABDOMINAL HYSTERECTOMY  1996   uterus and cervix  . COLON RESECTION N/A 07/12/2016   Procedure: LAPAROSCOPIC LYSIS OF ADHESIONS, OMENTOPEXY, HAND ASSISTED RESECTION OF  COLON, END TO END ANASTOMOSIS, COLOSTOMY;  Surgeon: Michael Boston, MD;  [facility];  Service: General;  Laterality: N/A;  . COMBINED HYSTEROSCOPY DIAGNOSTIC / D&C  x2 1990's  . DIAGNOSTIC LAPAROSCOPY  age 31 and age 24  . EUS N/A 01/18/2015   Procedure: LOWER ENDOSCOPIC ULTRASOUND (EUS);  Surgeon: Arta Silence, MD;  Location: [facility];  Service: Endoscopy;  Laterality: N/A;  . EXCISION OF SKIN TAG  11/17/2015   Procedure: EXCISION OF SKIN TAG;  Surgeon: Michael Boston, MD;  [facility];  Service: General;;  . ILEO LOOP COLOSTOMY  CLOSURE N/A 11/17/2015   Procedure: LAPAROSCOPIC DIVERTING LOOP ILEOSTOMY  DRAINAGE OF PELVIC ABSCESS;  Surgeon: Michael Boston, MD;  [facility];  Service: General;  Laterality: N/A;  . ILEOSTOMY CLOSURE N/A 05/31/2016   Procedure: TAKEDOWN LOOP ILEOSTOMY;  Surgeon: Michael Boston, MD;  [facility];  Service: General;  Laterality: N/A;  . IMPACTION REMOVAL  11/17/2015   Procedure: DISIMPACTION REMOVAL;  Surgeon: Michael Boston, MD;  [facility];  Service: General;;  . KNEE ARTHROSCOPY Left 1990's  . KNEE ARTHROSCOPY W/ MENISCECTOMY Left 09/14/2009   and chondroplasty debridement  . LAPAROSCOPIC LYSIS OF ADHESIONS  11/17/2015   Procedure: LAPAROSCOPIC  LYSIS OF ADHESIONS;  Surgeon: Michael Boston, MD;  [facility];  Service: General;;  . PORT-A-CATH REMOVAL N/A 11/21/2016   Procedure: REMOVAL PORT-A-CATH;  Surgeon: Michael Boston, MD;  Location: [facility];  Service: General;  Laterality: N/A;  . PORTACATH PLACEMENT N/A 05/25/2015   Procedure: INSERTION PORT-A-CATH;  Surgeon: Michael Boston, MD;  [facility];  Service: General;  Laterality: N/A;-remains inplace Right chest.  . ROTATOR CUFF REPAIR Right 2006  . TONSILLECTOMY  age 67  . XI ROBOTIC ASSISTED LOWER ANTERIOR RESECTION N/A 05/25/2015   Procedure: XI ROBOTIC ASSISTED LOWER ANTERIOR RESECTION, , RIGID PROCTOSCOPY, RIGHT OOPHORECTOMY;  Surgeon: Michael Boston, MD;  [facility];  Service: General;  Laterality: N/A;    There were no vitals filed for this visit.      Subjective Assessment - 12/25/16 1128    Subjective Bloodwork was today.  I have my CT tomorrow morning and see Dr. Burr Medico on Friday. I have been having alot of cramping.  I feel cramping is due to weather. Pain is worse it has been in 3 weeks.    Patient Stated Goals stop drainage, decrease pelvic pain   Currently in Pain? Yes   Pain Score 8    Pain Location Abdomen   Pain Orientation Lower   Pain Descriptors / Indicators Aching   Pain Type Chronic pain   Pain Onset More than a month ago   Pain Frequency Constant   Aggravating Factors  increased movement; end of urination with increased pressure and pain   Pain Relieving Factors after urinating   Multiple Pain Sites No                         [facility] Adult PT Treatment/Exercise - 12/25/16 0001      Lumbar Exercises: Aerobic   Stationary Bike Nustep L2 only x 10 min     Manual Therapy   Manual Therapy Soft tissue mobilization   Soft tissue mobilization along the abdominal wall, oblique muscles, transverse abdominus, lower abdominals,    Myofascial Release along the abdominal wall to reduce scar tissue and improve mobility of  the tissue                  PT Short Term Goals - 12/10/16 1143      PT SHORT TERM GOAL #3   Title lower abdominal pain with daily tasks decreased >/= 25%   Time 1   Period Months   Status Achieved     PT SHORT TERM GOAL #4   Title urinary leakage with coughing and sneezing decreased >/= 25%   Time 1   Period Months   Status Achieved     PT SHORT TERM GOAL #5   Title pressure and pain during urination decreased >/= 25%   Time  1   Period Months   Status Achieved           PT Long Term Goals - 12/25/16 1226      PT LONG TERM GOAL #1   Title independent with HEP and understand how to exercise at the pool   Time 2   Period Months   Status On-going     PT LONG TERM GOAL #2   Title pressure and pain with urination decreased >/= 75% due to increased mobility of tissue   Period Months   Status On-going     PT LONG TERM GOAL #3   Title urinary leakage with sneezing and coughing decreased >/= 75% due to increased pelvic floor strength >/= 4/5   Time 2   Period Months   Status On-going     PT LONG TERM GOAL #4   Title lower abdominal pain with increased activity decreased >/= 75% due to improved tissue mobility   Time 2   Period Months   Status On-going     PT LONG TERM GOAL #5   Title FOTO score </= 50%   Time 2   Period Months   Status New               Plan - 12/25/16 1222    Clinical Impression Statement Patient is having increase pain in lower abdomen and was decreased to 5/10 after soft tissue work. Patient has less pain in the buttocks from previous treatments. Patient will be having her CT scan tomorrow and had bloodwork done.  Patient will benenfit from skilled therapy to improve muscle coordination and decrease pain in buttocks.    Clinical Impairments Affecting Rehab Potential s/p recta adenocarcinoma; radiation and chemotherapy 02/20/2015 to 03/30/2015; 05/25/2015 recto-sigmoid segmental resection; 05/31/2016 ileostomy take down;  07/12/2016 segmental resection of proimal and distal colon for a colovaginal fistula   PT Frequency 2x / week   PT Duration 8 weeks   PT Treatment/Interventions Biofeedback;Patient/family education;Scar mobilization;Manual techniques;Manual lymph drainage;Neuromuscular re-education;Balance training;Therapeutic exercise;Therapeutic activities;Energy conservation   PT Next Visit Plan soft tissue work to abdomen and buttocks, along scars, work on abdomen; work on urinary leakage Internal work and how to dilator   PT [place] progress as needed   Consulted and Agree with Plan of Care Patient      Patient will benefit from skilled therapeutic intervention in order to improve the following deficits and impairments:  Decreased range of motion, Increased fascial restricitons, Increased muscle spasms, Pain, Decreased activity tolerance, Decreased balance, Decreased scar mobility, Impaired flexibility, Decreased strength, Decreased mobility  Visit Diagnosis: Muscle weakness (generalized)  Other muscle spasm  Unspecified lack of coordination  Aftercare following surgery for neoplasm     Problem List Patient Active Problem List   Diagnosis Date Noted  . Colostomy in place [facility]) 07/12/2016  . Colovaginal fistula s/p omentopexy repair 07/12/2016 07/10/2016  . Rectocutaneous fistula 07/10/2016  . Hydroureter on right 12/20/2015  . Abdominal pain 12/19/2015  . Hypokalemia 11/23/2015  . Colorectal delayed anastomotic leak s/p resection & colostomy 07/12/2016 11/17/2015  . Pelvic abscess s/p drainage & omental pedicle flap 11/17/2015 06/23/2015  . Anemia of chronic disease 06/14/2015  . Hypertension   . IBS (irritable bowel syndrome)   . Depression   . Cavernous hemangioma of liver - segments 5 & 7 02/07/2015  . Osteoarthritis of left knee 02/07/2015  . Crohn's disease ([place]) 02/07/2015  . Rectal adenocarcinoma s/p LAR resection 05/25/2015 01/31/2015  . Pure hypercholesterolemia  02/21/2014  .  Morbid obesity with BMI of 40.0-44.9, adult ([place]) 02/21/2014  . Family history of ischemic heart disease 02/21/2014    Earlie Counts, PT 12/25/16 12:28 PM   [facility] [address] [place], [place], [address] Phone: [phone]   Fax:  [phone]  Name: JOORY GOUGH MRN: [id number] Date of Birth: 28-Aug-1965

## 2016-12-26 ENCOUNTER — Ambulatory Visit (HOSPITAL_COMMUNITY)
Admission: RE | Admit: 2016-12-26 | Discharge: 2016-12-26 | Disposition: A | Discharge status: Home / Self Care | Payer: No Typology Code available for payment source | Source: Ambulatory Visit | Attending: Hematology | Admitting: Hematology

## 2016-12-26 ENCOUNTER — Encounter (HOSPITAL_COMMUNITY): Payer: Self-pay

## 2016-12-26 DIAGNOSIS — K769 Liver disease, unspecified: Secondary | ICD-10-CM | POA: Insufficient documentation

## 2016-12-26 DIAGNOSIS — C20 Malignant neoplasm of rectum: Secondary | ICD-10-CM | POA: Diagnosis not present

## 2016-12-26 MED ORDER — IOPAMIDOL (ISOVUE-300) INJECTION 61%
100.0000 mL | Freq: Once | INTRAVENOUS | Status: AC | PRN
  Administered 2016-12-26: 100 mL via INTRAVENOUS

## 2016-12-26 MED ORDER — IOPAMIDOL (ISOVUE-300) INJECTION 61%
INTRAVENOUS | Status: AC
  Filled 2016-12-26: qty 100

## 2016-12-26 MED ORDER — IOPAMIDOL (ISOVUE-300) INJECTION 61%
30.0000 mL | Freq: Once | INTRAVENOUS | Status: AC | PRN
  Administered 2016-12-26: 30 mL via ORAL

## 2016-12-26 MED ORDER — IOPAMIDOL (ISOVUE-300) INJECTION 61%
INTRAVENOUS | Status: AC
  Administered 2016-12-26: 30 mL via ORAL
  Filled 2016-12-26: qty 30

## 2016-12-27 ENCOUNTER — Ambulatory Visit (HOSPITAL_BASED_OUTPATIENT_CLINIC_OR_DEPARTMENT_OTHER): Payer: No Typology Code available for payment source | Admitting: Hematology

## 2016-12-27 ENCOUNTER — Other Ambulatory Visit: Payer: Self-pay | Admitting: *Deleted

## 2016-12-27 ENCOUNTER — Telehealth: Payer: Self-pay | Admitting: Hematology

## 2016-12-27 ENCOUNTER — Encounter: Payer: Self-pay | Admitting: Hematology

## 2016-12-27 VITALS — BP 138/78 | HR 70 | Temp 98.3°F | Resp 18 | Ht 70.0 in | Wt 314.6 lb

## 2016-12-27 DIAGNOSIS — C20 Malignant neoplasm of rectum: Secondary | ICD-10-CM

## 2016-12-27 DIAGNOSIS — Z808 Family history of malignant neoplasm of other organs or systems: Secondary | ICD-10-CM | POA: Diagnosis not present

## 2016-12-27 DIAGNOSIS — R5383 Other fatigue: Secondary | ICD-10-CM

## 2016-12-27 DIAGNOSIS — Z933 Colostomy status: Secondary | ICD-10-CM | POA: Diagnosis not present

## 2016-12-27 DIAGNOSIS — R109 Unspecified abdominal pain: Secondary | ICD-10-CM | POA: Diagnosis not present

## 2016-12-27 DIAGNOSIS — Z800 Family history of malignant neoplasm of digestive organs: Secondary | ICD-10-CM

## 2016-12-27 DIAGNOSIS — K769 Liver disease, unspecified: Secondary | ICD-10-CM | POA: Diagnosis not present

## 2016-12-27 DIAGNOSIS — I10 Essential (primary) hypertension: Secondary | ICD-10-CM

## 2016-12-27 DIAGNOSIS — F32A Depression, unspecified: Secondary | ICD-10-CM

## 2016-12-27 DIAGNOSIS — F329 Major depressive disorder, single episode, unspecified: Secondary | ICD-10-CM

## 2016-12-27 DIAGNOSIS — Z6841 Body Mass Index (BMI) 40.0 and over, adult: Secondary | ICD-10-CM

## 2016-12-27 MED ORDER — HYDROCODONE-ACETAMINOPHEN 5-325 MG PO TABS: 1.0000 | ORAL_TABLET | Freq: Four times a day (QID) | ORAL | 0 refills | Status: DC | PRN

## 2016-12-27 NOTE — Telephone Encounter (Signed)
Appointments scheduled per 12/27/16 los.  Patient was given a copy of the AVS report and appointment schedule, per 12/27/16 los.

## 2016-12-29 ENCOUNTER — Encounter: Payer: Self-pay | Admitting: Hematology

## 2017-01-03 ENCOUNTER — Encounter: Payer: No Typology Code available for payment source | Admitting: Physical Therapy

## 2017-01-08 ENCOUNTER — Ambulatory Visit: Payer: No Typology Code available for payment source | Admitting: Physical Therapy

## 2017-01-08 ENCOUNTER — Encounter: Payer: Self-pay | Admitting: Physical Therapy

## 2017-01-08 DIAGNOSIS — M6281 Muscle weakness (generalized): Secondary | ICD-10-CM

## 2017-01-08 DIAGNOSIS — R279 Unspecified lack of coordination: Secondary | ICD-10-CM

## 2017-01-08 DIAGNOSIS — M62838 Other muscle spasm: Secondary | ICD-10-CM

## 2017-01-08 DIAGNOSIS — Z483 Aftercare following surgery for neoplasm: Secondary | ICD-10-CM

## 2017-01-08 NOTE — Therapy (Signed)
[facility] [address], [place], [place], [address] Phone: [phone]   Fax:  [phone]  Physical Therapy Treatment  Patient Details  Name: Lindsay Little MRN: [id number] Date of Birth: 1966-03-11 Referring Provider: Dr. Michael Boston  Encounter Date: 01/08/2017      PT End of Session - 01/08/17 1139    Visit Number 11   Date for PT Re-Evaluation 01/13/17   Authorization Type [employer]   PT Start Time 1100   PT Stop Time 1145   PT Time Calculation (min) 45 min   Activity Tolerance Patient tolerated treatment well   Behavior During Therapy [facility] for tasks assessed/performed      Past Medical History:  Diagnosis Date  . Anxiety   . Chemotherapy-induced neuropathy (HCC)    toes and fingers numbness and tingling  . Colostomy in place [facility]) 07/12/2016   due to anastomosis breakdown w/ colovaginal fistula  . Complication of anesthesia   . Depression   . Family history of adverse reaction to anesthesia    mother-- ponv  . Gastroenteritis 12/20/2015  . GERD (gastroesophageal reflux disease)   . Hiatal hernia   . History of cancer chemotherapy 02-20-2015 to 03-30-2015  . History of cardiac murmur as a child   . History of chemotherapy   . History of chronic gastritis   . History of hypertension    no issues since multiple abdminal sx's and chemo--- no medication since 12/ 2016  . History of TMJ disorder   . Hypercholesteremia   . IBS (irritable bowel syndrome)    dx age 75  . Intermittent abdominal pain    post-op multiple abdominal sx's   . Microcytic anemia   . Mild sleep apnea    per pt study 2014  very mild osa , no cpap recommended, recommeded wt loss and sleep routine  . OA (osteoarthritis)    left knee /  left shoulder  . Obesity   . PONV (postoperative nausea and vomiting)    severe" needs Scopolamine PATCH"   . Portacath in place    right chest  . Rectal adenocarcinoma ([place]) oncologis-  dr  Burr Medico-- after radiation/ chemo (ypT1, N0) --  no recurrence per last note 03/ 2018   dx 01-13-2015-- Stage IIIC (T3, N2, M0) post concurrent radiation and chemotherapy 02-20-2015 to 03-30-2015 /  05-25-2015 s/p  LAR w/ RSO (post-op complicated by late abscess and contained anatomotic leak w/ help percutaneous drainage and antibiotics)   . Rotator cuff tear, left   . S/P radiation therapy 02/20/15-03/30/15   colon/rectal  . Vitamin D deficiency   . Wears glasses   . Wears glasses     Past Surgical History:  Procedure Laterality Date  . ABDOMINAL HYSTERECTOMY  1996   uterus and cervix  . COLON RESECTION N/A 07/12/2016   Procedure: LAPAROSCOPIC LYSIS OF ADHESIONS, OMENTOPEXY, HAND ASSISTED RESECTION OF  COLON, END TO END ANASTOMOSIS, COLOSTOMY;  Surgeon: Michael Boston, MD;  Location: [facility];  Service: General;  Laterality: N/A;  . COMBINED HYSTEROSCOPY DIAGNOSTIC / D&C  x2 1990's  . DIAGNOSTIC LAPAROSCOPY  age 56 and age 65  . EUS N/A 01/18/2015   Procedure: LOWER ENDOSCOPIC ULTRASOUND (EUS);  Surgeon: Arta Silence, MD;  Location: [facility];  Service: Endoscopy;  Laterality: N/A;  . EXCISION OF SKIN TAG  11/17/2015   Procedure: EXCISION OF SKIN TAG;  Surgeon: Michael Boston, MD;  [facility];  Service: General;;  . ILEO LOOP COLOSTOMY  CLOSURE N/A 11/17/2015   Procedure: LAPAROSCOPIC DIVERTING LOOP ILEOSTOMY  DRAINAGE OF PELVIC ABSCESS;  Surgeon: Michael Boston, MD;  [facility];  Service: General;  Laterality: N/A;  . ILEOSTOMY CLOSURE N/A 05/31/2016   Procedure: TAKEDOWN LOOP ILEOSTOMY;  Surgeon: Michael Boston, MD;  [facility];  Service: General;  Laterality: N/A;  . IMPACTION REMOVAL  11/17/2015   Procedure: DISIMPACTION REMOVAL;  Surgeon: Michael Boston, MD;  [facility];  Service: General;;  . KNEE ARTHROSCOPY Left 1990's  . KNEE ARTHROSCOPY W/ MENISCECTOMY Left 09/14/2009   and chondroplasty debridement  . LAPAROSCOPIC LYSIS OF ADHESIONS  11/17/2015   Procedure: LAPAROSCOPIC  LYSIS OF ADHESIONS;  Surgeon: Michael Boston, MD;  [facility];  Service: General;;  . PORT-A-CATH REMOVAL N/A 11/21/2016   Procedure: REMOVAL PORT-A-CATH;  Surgeon: Michael Boston, MD;  Location: [facility];  Service: General;  Laterality: N/A;  . PORTACATH PLACEMENT N/A 05/25/2015   Procedure: INSERTION PORT-A-CATH;  Surgeon: Michael Boston, MD;  [facility];  Service: General;  Laterality: N/A;-remains inplace Right chest.  . ROTATOR CUFF REPAIR Right 2006  . TONSILLECTOMY  age 48  . XI ROBOTIC ASSISTED LOWER ANTERIOR RESECTION N/A 05/25/2015   Procedure: XI ROBOTIC ASSISTED LOWER ANTERIOR RESECTION, , RIGID PROCTOSCOPY, RIGHT OOPHORECTOMY;  Surgeon: Michael Boston, MD;  [facility];  Service: General;  Laterality: N/A;    There were no vitals filed for this visit.      Subjective Assessment - 01/08/17 1102    Subjective Sitting with 80% less pain and do not need a pillow. Dr. Johney Maine reports the drainage will not go away but will lessen. Dr. Johney Maine did a vaginal exam and I am sitll bleeding.  Since I have the fistula going between the vaginal and rectal.  I have drainage between the th 2 areas.  I do not have to see Dr. Johney Maine anymore.  CT did not show anything. I did alot of walking during my trip so I had alot of pain and missed my appointment due to being exhausted.    Patient Stated Goals stop drainage, decrease pelvic pain   Currently in Pain? Yes   Pain Score 5    Pain Location Abdomen   Pain Orientation Lower   Pain Descriptors / Indicators Aching   Pain Type Chronic pain   Pain Onset More than a month ago   Pain Frequency Constant   Aggravating Factors  increased movement; end of urination with increased pressure and pain   Pain Relieving Factors after urinating   Multiple Pain Sites No                         [facility] Adult PT Treatment/Exercise - 01/08/17 0001      Lumbar Exercises: Aerobic   Stationary Bike Nustep L2 only x 15 min      Manual Therapy   Manual Therapy Soft tissue mobilization   Soft tissue mobilization along the abdominal wall, oblique muscles, transverse abdominus, lower abdominals,    Myofascial Release along the abdominal wall to reduce scar tissue and improve mobility of the tissue                  PT Short Term Goals - 12/10/16 1143      PT SHORT TERM GOAL #3   Title lower abdominal pain with daily tasks decreased >/= 25%   Time 1   Period Months   Status Achieved  PT SHORT TERM GOAL #4   Title urinary leakage with coughing and sneezing decreased >/= 25%   Time 1   Period Months   Status Achieved     PT SHORT TERM GOAL #5   Title pressure and pain during urination decreased >/= 25%   Time 1   Period Months   Status Achieved           PT Long Term Goals - 01/08/17 1107      PT LONG TERM GOAL #1   Title independent with HEP and understand how to exercise at the pool   Time 2   Period Months   Status On-going     PT LONG TERM GOAL #2   Title pressure and pain with urination decreased >/= 75% due to increased mobility of tissue   Time 2   Period Months   Status On-going  first thing in morning; 50% improvement     PT LONG TERM GOAL #3   Title urinary leakage with sneezing and coughing decreased >/= 75% due to increased pelvic floor strength >/= 4/5   Baseline 70% better   Time 2   Period Months   Status On-going     PT LONG TERM GOAL #4   Title lower abdominal pain with increased activity decreased >/= 75% due to improved tissue mobility   Time 2   Period Months   Status On-going     PT LONG TERM GOAL #5   Title FOTO score </= 50%   Time 2   Period Months   Status New               Plan - 01/08/17 1140    Clinical Impression Statement Patient did alot of activity over last week so she had increased pain.  Patient urinary leakage is 50% better. After therapy there was increased mobility of the soft tissue.  Patient has decreased overall pain.   Patient  has decreased pressure with  urination by 50%.  Patient will benefit from skilled therapy to reduce pain and increased strength.    Rehab Potential Good   Clinical Impairments Affecting Rehab Potential s/p recta adenocarcinoma; radiation and chemotherapy 02/20/2015 to 03/30/2015; 05/25/2015 recto-sigmoid segmental resection; 05/31/2016 ileostomy take down; 07/12/2016 segmental resection of proimal and distal colon for a colovaginal fistula   PT Frequency 2x / week   PT Duration 8 weeks   PT Treatment/Interventions Biofeedback;Patient/family education;Scar mobilization;Manual techniques;Manual lymph drainage;Neuromuscular re-education;Balance training;Therapeutic exercise;Therapeutic activities;Energy conservation   PT Next Visit Plan soft tissue work to abdomen and buttocks, along scars, work on abdomen; work on urinary leakage Internal work and how to dilator   PT [place] progress as needed   Consulted and Agree with Plan of Care Patient      Patient will benefit from skilled therapeutic intervention in order to improve the following deficits and impairments:  Decreased range of motion, Increased fascial restricitons, Increased muscle spasms, Pain, Decreased activity tolerance, Decreased balance, Decreased scar mobility, Impaired flexibility, Decreased strength, Decreased mobility  Visit Diagnosis: Muscle weakness (generalized)  Other muscle spasm  Unspecified lack of coordination  Aftercare following surgery for neoplasm     Problem List Patient Active Problem List   Diagnosis Date Noted  . Colostomy in place [facility]) 07/12/2016  . Colovaginal fistula s/p omentopexy repair 07/12/2016 07/10/2016  . Rectocutaneous fistula 07/10/2016  . Hydroureter on right 12/20/2015  . Abdominal pain 12/19/2015  . Hypokalemia 11/23/2015  . Colorectal delayed anastomotic leak s/p resection &  colostomy 07/12/2016 11/17/2015  . Pelvic abscess s/p drainage & omental pedicle flap  11/17/2015 06/23/2015  . Anemia of chronic disease 06/14/2015  . Hypertension   . IBS (irritable bowel syndrome)   . Depression   . Cavernous hemangioma of liver - segments 5 & 7 02/07/2015  . Osteoarthritis of left knee 02/07/2015  . Crohn's disease ([place]) 02/07/2015  . Rectal adenocarcinoma s/p LAR resection 05/25/2015 01/31/2015  . Pure hypercholesterolemia 02/21/2014  . Morbid obesity with BMI of 40.0-44.9, adult ([place]) 02/21/2014  . Family history of ischemic heart disease 02/21/2014    Earlie Counts, PT 01/08/17 11:46 AM   [facility] [address], [place], [place], [address] Phone: [phone]   Fax:  [phone]  Name: Lindsay Little MRN: [id number] Date of Birth: [birth year]

## 2017-01-13 ENCOUNTER — Ambulatory Visit: Payer: BLUE CROSS/BLUE SHIELD | Attending: Surgery | Admitting: Physical Therapy

## 2017-01-13 DIAGNOSIS — M6281 Muscle weakness (generalized): Secondary | ICD-10-CM | POA: Insufficient documentation

## 2017-01-13 DIAGNOSIS — Z483 Aftercare following surgery for neoplasm: Secondary | ICD-10-CM | POA: Insufficient documentation

## 2017-01-13 DIAGNOSIS — R279 Unspecified lack of coordination: Secondary | ICD-10-CM | POA: Insufficient documentation

## 2017-01-13 DIAGNOSIS — M62838 Other muscle spasm: Secondary | ICD-10-CM | POA: Insufficient documentation

## 2017-01-16 ENCOUNTER — Encounter: Payer: Self-pay | Admitting: Physical Therapy

## 2017-01-16 ENCOUNTER — Ambulatory Visit: Payer: BLUE CROSS/BLUE SHIELD | Admitting: Physical Therapy

## 2017-01-16 DIAGNOSIS — Z483 Aftercare following surgery for neoplasm: Secondary | ICD-10-CM

## 2017-01-16 DIAGNOSIS — M6281 Muscle weakness (generalized): Secondary | ICD-10-CM

## 2017-01-16 DIAGNOSIS — M62838 Other muscle spasm: Secondary | ICD-10-CM | POA: Diagnosis present

## 2017-01-16 DIAGNOSIS — R279 Unspecified lack of coordination: Secondary | ICD-10-CM

## 2017-01-16 NOTE — Therapy (Signed)
[facility] [address], [place], [place], [address] Phone: [phone]   Fax:  [phone]  Physical Therapy Treatment  Patient Details  Name: Lindsay Little MRN: [id number] Date of Birth: [birth year] Referring Provider: Dr. Michael Boston  Encounter Date: 01/16/2017      PT End of Session - 01/16/17 1153    Visit Number 12   Date for PT Re-Evaluation 03/10/17   Authorization Type [employer]   Authorization - Visit Number 12   Authorization - Number of Visits 42   PT Start Time 5462   PT Stop Time 1230   PT Time Calculation (min) 45 min   Activity Tolerance Patient tolerated treatment well   Behavior During Therapy [facility] for tasks assessed/performed      Past Medical History:  Diagnosis Date  . Anxiety   . Chemotherapy-induced neuropathy (HCC)    toes and fingers numbness and tingling  . Colostomy in place [facility]) 07/12/2016   due to anastomosis breakdown w/ colovaginal fistula  . Complication of anesthesia   . Depression   . Family history of adverse reaction to anesthesia    mother-- ponv  . Gastroenteritis 12/20/2015  . GERD (gastroesophageal reflux disease)   . Hiatal hernia   . History of cancer chemotherapy 02-20-2015 to 03-30-2015  . History of cardiac murmur as a child   . History of chemotherapy   . History of chronic gastritis   . History of hypertension    no issues since multiple abdminal sx's and chemo--- no medication since 12/ 2016  . History of TMJ disorder   . Hypercholesteremia   . IBS (irritable bowel syndrome)    dx age 7  . Intermittent abdominal pain    post-op multiple abdominal sx's   . Microcytic anemia   . Mild sleep apnea    per pt study 2014  very mild osa , no cpap recommended, recommeded wt loss and sleep routine  . OA (osteoarthritis)    left knee /  left shoulder  . Obesity   . PONV (postoperative nausea and vomiting)    severe" needs Scopolamine PATCH"   . Portacath  in place    right chest  . Rectal adenocarcinoma ([place]) oncologis-  dr Burr Medico-- after radiation/ chemo (ypT1, N0) --  no recurrence per last note 03/ 2018   dx 01-13-2015-- Stage IIIC (T3, N2, M0) post concurrent radiation and chemotherapy 02-20-2015 to 03-30-2015 /  05-25-2015 s/p  LAR w/ RSO (post-op complicated by late abscess and contained anatomotic leak w/ help percutaneous drainage and antibiotics)   . Rotator cuff tear, left   . S/P radiation therapy 02/20/15-03/30/15   colon/rectal  . Vitamin D deficiency   . Wears glasses   . Wears glasses     Past Surgical History:  Procedure Laterality Date  . ABDOMINAL HYSTERECTOMY  1996   uterus and cervix  . COLON RESECTION N/A 07/12/2016   Procedure: LAPAROSCOPIC LYSIS OF ADHESIONS, OMENTOPEXY, HAND ASSISTED RESECTION OF  COLON, END TO END ANASTOMOSIS, COLOSTOMY;  Surgeon: Michael Boston, MD;  [facility];  Service: General;  Laterality: N/A;  . COMBINED HYSTEROSCOPY DIAGNOSTIC / D&C  x2 1990's  . DIAGNOSTIC LAPAROSCOPY  age 18 and age 89  . EUS N/A 01/18/2015   Procedure: LOWER ENDOSCOPIC ULTRASOUND (EUS);  Surgeon: Arta Silence, MD;  Location: [facility];  Service: Endoscopy;  Laterality: N/A;  . EXCISION OF SKIN TAG  11/17/2015   Procedure: EXCISION OF SKIN TAG;  Surgeon:  Michael Boston, MD;  [facility];  Service: General;;  . ILEO LOOP COLOSTOMY CLOSURE N/A 11/17/2015   Procedure: LAPAROSCOPIC DIVERTING LOOP ILEOSTOMY  DRAINAGE OF PELVIC ABSCESS;  Surgeon: Michael Boston, MD;  [facility];  Service: General;  Laterality: N/A;  . ILEOSTOMY CLOSURE N/A 05/31/2016   Procedure: TAKEDOWN LOOP ILEOSTOMY;  Surgeon: Michael Boston, MD;  [facility];  Service: General;  Laterality: N/A;  . IMPACTION REMOVAL  11/17/2015   Procedure: DISIMPACTION REMOVAL;  Surgeon: Michael Boston, MD;  [facility];  Service: General;;  . KNEE ARTHROSCOPY Left 1990's  . KNEE ARTHROSCOPY W/ MENISCECTOMY Left 09/14/2009   and chondroplasty debridement  .  LAPAROSCOPIC LYSIS OF ADHESIONS  11/17/2015   Procedure: LAPAROSCOPIC LYSIS OF ADHESIONS;  Surgeon: Michael Boston, MD;  [facility];  Service: General;;  . PORT-A-CATH REMOVAL N/A 11/21/2016   Procedure: REMOVAL PORT-A-CATH;  Surgeon: Michael Boston, MD;  Location: [facility]  Service: General;  Laterality: N/A;  . PORTACATH PLACEMENT N/A 05/25/2015   Procedure: INSERTION PORT-A-CATH;  Surgeon: Michael Boston, MD;  [facility];  Service: General;  Laterality: N/A;-remains inplace Right chest.  . ROTATOR CUFF REPAIR Right 2006  . TONSILLECTOMY  age 56  . XI ROBOTIC ASSISTED LOWER ANTERIOR RESECTION N/A 05/25/2015   Procedure: XI ROBOTIC ASSISTED LOWER ANTERIOR RESECTION, , RIGID PROCTOSCOPY, RIGHT OOPHORECTOMY;  Surgeon: Michael Boston, MD;  [facility];  Service: General;  Laterality: N/A;    There were no vitals filed for this visit.      Subjective Assessment - 01/16/17 1146    Subjective I missed last visit due to sleeping from taking medication.  When I have a busy day, the next day is difficult to do activity.  I see Dr. Dema Severin on Tuesday to check out my pain medication. My Lindsay Little is done.    Patient Stated Goals stop drainage, decrease pelvic pain   Currently in Pain? Yes   Pain Score 4    Pain Location Abdomen   Pain Orientation Lower   Pain Descriptors / Indicators Aching   Pain Type Chronic pain   Pain Onset More than a month ago   Pain Frequency Constant   Aggravating Factors  increased movment; end of urination with increased pressure and pain   Pain Relieving Factors after urinating            [facility] PT Assessment - 01/16/17 0001      Assessment   Medical Diagnosis R10.2 Chronic Pelvic Pain in Female   Referring Provider Dr. Michael Boston   Onset Date/Surgical Date 05/25/15   Prior Therapy None     Precautions   Precautions Other (comment)   Precaution Comments cancer     Restrictions   Weight Bearing Restrictions No     Balance Screen    Has the patient fallen in the past 6 months No   Has the patient had a decrease in activity level because of a fear of falling?  No   Is the patient reluctant to leave their home because of a fear of falling?  No     Home Environment   Living Environment Private residence   Type of [place] to enter   [place] One level     Prior Function   Level of [place] On disability   Vocation Requirements get back to [employer]-v[employer], standing on feet,    Leisure walking  Cognition   Overall Cognitive Status Within Functional Limits for tasks assessed     Observation/Other Assessments   Observations scars are still red, healed and decreased mobility on sacrum and abdomen superiorly and inferiorly   Focus on Therapeutic Outcomes (FOTO)  79% limitation for PFDI  Pain survey     Posture/Postural Control   Posture/Postural Control Postural limitations   Postural Limitations Rounded Shoulders;Forward head     AROM   Overall AROM  Within functional limits for tasks performed     PROM   Overall PROM  Within functional limits for tasks performed     Strength   Right Hip Extension 5/5   Right Hip ABduction 5/5   Right Hip ADduction 5/5   Left Hip ABduction 5/5     Palpation   SI assessment  pelvis in correct alignment                  Pelvic Floor Special Questions - 01/16/17 0001    Currently Sexually Active No  wants to be, has not since cancer diagnosis   Urinary Leakage Yes   Pad use 2 dpends   Activities that cause leaking Sneezing;Coughing   Fecal incontinence No  has liquid coming out of the rectum that is on the pad   External Perineal Exam discharge on the vulva area there was discharge coming from the rectal area   Skin Integrity Intact   Exam Type Deferred  due to not wanting pain   Strength weak squeeze, no lift           [facility] Adult PT Treatment/Exercise - 01/16/17 0001      Lumbar Exercises:  Aerobic   Stationary Bike Nustep L2 only x 15 min     Manual Therapy   Manual Therapy Soft tissue mobilization;Myofascial release   Soft tissue mobilization along the abdominal wall, oblique muscles, transverse abdominus, lower abdominals,    Myofascial Release along the abdominal wall to reduce scar tissue and improve mobility of the tissue                PT Education - 01/16/17 1226    Education provided Yes   Education Details bring dilator in for next visit   Person(s) Educated Patient   Methods Explanation   Comprehension Verbalized understanding          PT Short Term Goals - 12/10/16 1143      PT SHORT TERM GOAL #3   Title lower abdominal pain with daily tasks decreased >/= 25%   Time 1   Period Months   Status Achieved     PT SHORT TERM GOAL #4   Title urinary leakage with coughing and sneezing decreased >/= 25%   Time 1   Period Months   Status Achieved     PT SHORT TERM GOAL #5   Title pressure and pain during urination decreased >/= 25%   Time 1   Period Months   Status Achieved           PT Long Term Goals - 01/16/17 1149      PT LONG TERM GOAL #1   Title independent with HEP and understand how to exercise at the pool   Time 2   Period Months   Status On-going     PT LONG TERM GOAL #2   Title pressure and pain with urination decreased >/= 75% due to increased mobility of tissue   Time 2   Period Months  Status On-going  50% improvement     PT LONG TERM GOAL #3   Title urinary leakage with sneezing and coughing decreased >/= 75% due to increased pelvic floor strength >/= 4/5   Baseline 70% better   Period Months   Status On-going  70% better     PT LONG TERM GOAL #4   Title lower abdominal pain with increased activity decreased >/= 75% due to improved tissue mobility   Time 2   Period Months   Status On-going  none     PT LONG TERM GOAL #5   Title FOTO score </= 50%   Time 2   Period Months   Status New                Plan - 01/16/17 1153    Clinical Impression Statement Patient rectal area is 65% better and she is able to sit for 1.5 hours instead of 10 min.  Patient urinary leakage is 65% better.  Patient has not been using her dilator or having intercourse.  Patient has decreased pressure with urination by 50%.  Patient continues to have discharge comina out of the rectal and vaginal area that is coming from her fissure.  Patient has improve moblity of lower abdominal tissue but no change in pain yet.  Patient has increased strength of bil. hips.  Patient will benefit from skilled therapy to reduce pain and increased overal strength.    Rehab Potential Good   Clinical Impairments Affecting Rehab Potential s/p recta adenocarcinoma; radiation and chemotherapy 02/20/2015 to 03/30/2015; 05/25/2015 recto-sigmoid segmental resection; 05/31/2016 ileostomy take down; 07/12/2016 segmental resection of proimal and distal colon for a colovaginal fistula   PT Frequency 2x / week   PT Duration 8 weeks   PT Treatment/Interventions Biofeedback;Patient/family education;Scar mobilization;Manual techniques;Manual lymph drainage;Neuromuscular re-education;Balance training;Therapeutic exercise;Therapeutic activities;Energy conservation   PT Next Visit Plan soft tissue work to abdomen and buttocks, along scars, work on abdomen; work on urinary leakage Internal work and how to dilator   PT [place] progress as needed   Consulted and Agree with Plan of Care Patient      Patient will benefit from skilled therapeutic intervention in order to improve the following deficits and impairments:  Decreased range of motion, Increased fascial restricitons, Increased muscle spasms, Pain, Decreased activity tolerance, Decreased balance, Decreased scar mobility, Impaired flexibility, Decreased strength, Decreased mobility  Visit Diagnosis: Muscle weakness (generalized)  Other muscle spasm  Unspecified lack of  coordination  Aftercare following surgery for neoplasm     Problem List Patient Active Problem List   Diagnosis Date Noted  . Colostomy in place [facility]) 07/12/2016  . Colovaginal fistula s/p omentopexy repair 07/12/2016 07/10/2016  . Rectocutaneous fistula 07/10/2016  . Hydroureter on right 12/20/2015  . Abdominal pain 12/19/2015  . Hypokalemia 11/23/2015  . Colorectal delayed anastomotic leak s/p resection & colostomy 07/12/2016 11/17/2015  . Pelvic abscess s/p drainage & omental pedicle flap 11/17/2015 06/23/2015  . Anemia of chronic disease 06/14/2015  . Hypertension   . IBS (irritable bowel syndrome)   . Depression   . Cavernous hemangioma of liver - segments 5 & 7 02/07/2015  . Osteoarthritis of left knee 02/07/2015  . Crohn's disease ([place]) 02/07/2015  . Rectal adenocarcinoma s/p LAR resection 05/25/2015 01/31/2015  . Pure hypercholesterolemia 02/21/2014  . Morbid obesity with BMI of 40.0-44.9, adult ([place]) 02/21/2014  . Family history of ischemic heart disease 02/21/2014    Earlie Counts, PT 01/16/17 12:31 PM   [facility] [address], [place], [place], [address] Phone: [phone]   Fax:  [phone]  Name: Lindsay Little MRN: [id number] Date of Birth: 03-02-1966

## 2017-01-20 ENCOUNTER — Encounter: Payer: Self-pay | Admitting: Physical Therapy

## 2017-01-20 ENCOUNTER — Ambulatory Visit: Payer: BLUE CROSS/BLUE SHIELD | Admitting: Physical Therapy

## 2017-01-20 DIAGNOSIS — Z483 Aftercare following surgery for neoplasm: Secondary | ICD-10-CM

## 2017-01-20 DIAGNOSIS — M6281 Muscle weakness (generalized): Secondary | ICD-10-CM | POA: Diagnosis not present

## 2017-01-20 DIAGNOSIS — M62838 Other muscle spasm: Secondary | ICD-10-CM

## 2017-01-20 DIAGNOSIS — R279 Unspecified lack of coordination: Secondary | ICD-10-CM

## 2017-01-20 NOTE — Therapy (Signed)
[facility] [address] [place], [place], [address] Phone: [phone]   Fax:  [phone]  Physical Therapy Treatment  Patient Details  Name: Lindsay Little MRN: [id number] Date of Birth: [birth year] Referring Provider: Dr. Michael Boston  Encounter Date: 01/20/2017      PT End of Session - 01/20/17 1452    Visit Number 13   Date for PT Re-Evaluation 03/10/17   Authorization Type [employer]   Authorization - Visit Number 13   Authorization - Number of Visits 42   PT Start Time 7564   PT Stop Time 1525   PT Time Calculation (min) 50 min   Activity Tolerance Patient tolerated treatment well   Behavior During Therapy [facility] for tasks assessed/performed      Past Medical History:  Diagnosis Date  . Anxiety   . Chemotherapy-induced neuropathy (HCC)    toes and fingers numbness and tingling  . Colostomy in place [facility]) 07/12/2016   due to anastomosis breakdown w/ colovaginal fistula  . Complication of anesthesia   . Depression   . Family history of adverse reaction to anesthesia    mother-- ponv  . Gastroenteritis 12/20/2015  . GERD (gastroesophageal reflux disease)   . Hiatal hernia   . History of cancer chemotherapy 02-20-2015 to 03-30-2015  . History of cardiac murmur as a child   . History of chemotherapy   . History of chronic gastritis   . History of hypertension    no issues since multiple abdminal sx's and chemo--- no medication since 12/ 2016  . History of TMJ disorder   . Hypercholesteremia   . IBS (irritable bowel syndrome)    dx age 89  . Intermittent abdominal pain    post-op multiple abdominal sx's   . Microcytic anemia   . Mild sleep apnea    per pt study 2014  very mild osa , no cpap recommended, recommeded wt loss and sleep routine  . OA (osteoarthritis)    left knee /  left shoulder  . Obesity   . PONV (postoperative nausea and vomiting)    severe" needs Scopolamine PATCH"   . Portacath  in place    right chest  . Rectal adenocarcinoma ([place]) oncologis-  dr Burr Medico-- after radiation/ chemo (ypT1, N0) --  no recurrence per last note 03/ 2018   dx 01-13-2015-- Stage IIIC (T3, N2, M0) post concurrent radiation and chemotherapy 02-20-2015 to 03-30-2015 /  05-25-2015 s/p  LAR w/ RSO (post-op complicated by late abscess and contained anatomotic leak w/ help percutaneous drainage and antibiotics)   . Rotator cuff tear, left   . S/P radiation therapy 02/20/15-03/30/15   colon/rectal  . Vitamin D deficiency   . Wears glasses   . Wears glasses     Past Surgical History:  Procedure Laterality Date  . ABDOMINAL HYSTERECTOMY  1996   uterus and cervix  . COLON RESECTION N/A 07/12/2016   Procedure: LAPAROSCOPIC LYSIS OF ADHESIONS, OMENTOPEXY, HAND ASSISTED RESECTION OF  COLON, END TO END ANASTOMOSIS, COLOSTOMY;  Surgeon: Michael Boston, MD;  [facility]: [facility];  Service: General;  Laterality: N/A;  . COMBINED HYSTEROSCOPY DIAGNOSTIC / D&C  x2 1990's  . DIAGNOSTIC LAPAROSCOPY  age 33 and age 35  . EUS N/A 01/18/2015   Procedure: LOWER ENDOSCOPIC ULTRASOUND (EUS);  Surgeon: Arta Silence, MD;  Location: [facility];  Service: Endoscopy;  Laterality: N/A;  . EXCISION OF SKIN TAG  11/17/2015   Procedure: EXCISION OF SKIN TAG;  Surgeon:  Michael Boston, MD;  [facility];  Service: General;;  . ILEO LOOP COLOSTOMY CLOSURE N/A 11/17/2015   Procedure: LAPAROSCOPIC DIVERTING LOOP ILEOSTOMY  DRAINAGE OF PELVIC ABSCESS;  Surgeon: Michael Boston, MD;  [facility];  Service: General;  Laterality: N/A;  . ILEOSTOMY CLOSURE N/A 05/31/2016   Procedure: TAKEDOWN LOOP ILEOSTOMY;  Surgeon: Michael Boston, MD;  [facility];  Service: General;  Laterality: N/A;  . IMPACTION REMOVAL  11/17/2015   Procedure: DISIMPACTION REMOVAL;  Surgeon: Michael Boston, MD;  [facility];  Service: General;;  . KNEE ARTHROSCOPY Left 1990's  . KNEE ARTHROSCOPY W/ MENISCECTOMY Left 09/14/2009   and chondroplasty debridement  .  LAPAROSCOPIC LYSIS OF ADHESIONS  11/17/2015   Procedure: LAPAROSCOPIC LYSIS OF ADHESIONS;  Surgeon: Michael Boston, MD;  [facility];  Service: General;;  . PORT-A-CATH REMOVAL N/A 11/21/2016   Procedure: REMOVAL PORT-A-CATH;  Surgeon: Michael Boston, MD;  Location: [facility];  Service: General;  Laterality: N/A;  . PORTACATH PLACEMENT N/A 05/25/2015   Procedure: INSERTION PORT-A-CATH;  Surgeon: Michael Boston, MD;  [facility];  Service: General;  Laterality: N/A;-remains inplace Right chest.  . ROTATOR CUFF REPAIR Right 2006  . TONSILLECTOMY  age 59  . XI ROBOTIC ASSISTED LOWER ANTERIOR RESECTION N/A 05/25/2015   Procedure: XI ROBOTIC ASSISTED LOWER ANTERIOR RESECTION, , RIGID PROCTOSCOPY, RIGHT OOPHORECTOMY;  Surgeon: Michael Boston, MD;  [facility];  Service: General;  Laterality: N/A;    There were no vitals filed for this visit.      Subjective Assessment - 01/20/17 1453    Subjective The gabapentin is helping.    Patient Stated Goals stop drainage, decrease pelvic pain   Currently in Pain? Yes   Pain Score 4    Pain Location Abdomen   Pain Orientation Lower   Pain Descriptors / Indicators Aching   Pain Type Chronic pain   Pain Onset More than a month ago   Pain Frequency Intermittent   Aggravating Factors  increased movement; end of urination with increased pressure and pain   Pain Relieving Factors after urinating   Multiple Pain Sites No                         [facility] Adult PT Treatment/Exercise - 01/20/17 0001      Self-Care   Self-Care Other Self-Care Comments     Lumbar Exercises: Aerobic   Stationary Bike Nustep L2 only x 15 min     Manual Therapy   Manual Therapy Internal Pelvic Floor   Internal Pelvic Floor bil. sides of obturator internist, levator ani, urethra sphincter and introitus                PT Education - 01/20/17 1505    Education provided Yes   Education Details How to use a dilator; gave  lubricator samples   Person(s) Educated Patient   Methods Explanation;Demonstration;Verbal cues;Handout   Comprehension Verbalized understanding;Returned demonstration          PT Short Term Goals - 12/10/16 1143      PT SHORT TERM GOAL #3   Title lower abdominal pain with daily tasks decreased >/= 25%   Time 1   Period Months   Status Achieved     PT SHORT TERM GOAL #4   Title urinary leakage with coughing and sneezing decreased >/= 25%   Time 1   Period Months   Status Achieved     PT  SHORT TERM GOAL #5   Title pressure and pain during urination decreased >/= 25%   Time 1   Period Months   Status Achieved           PT Long Term Goals - 01/20/17 1528      PT LONG TERM GOAL #1   Title independent with HEP and understand how to exercise at the pool   Time 2   Status On-going     PT LONG TERM GOAL #2   Title pressure and pain with urination decreased >/= 75% due to increased mobility of tissue   Time 2   Period Months   Status On-going  505 improvement     PT LONG TERM GOAL #3   Title urinary leakage with sneezing and coughing decreased >/= 75% due to increased pelvic floor strength >/= 4/5   Baseline 70% better   Time 2   Period Months   Status On-going  70% better     PT LONG TERM GOAL #4   Title lower abdominal pain with increased activity decreased >/= 75% due to improved tissue mobility   Time 2   Period Months   Status On-going               Plan - 01/20/17 1525    Clinical Impression Statement Patient was able to tolerate the internal soft tissue work with less pain.  Increased mobility of the bil. sides of the urethra sphincter.  Patient will be starting with the smallest dilator. Patient vaginal strength is 3/5. Patient will benefit from skilled therapy to reduce and increased overall strength.    Rehab Potential Good   Clinical Impairments Affecting Rehab Potential s/p recta adenocarcinoma; radiation and chemotherapy 02/20/2015 to  03/30/2015; 05/25/2015 recto-sigmoid segmental resection; 05/31/2016 ileostomy take down; 07/12/2016 segmental resection of proimal and distal colon for a colovaginal fistula   PT Frequency 2x / week   PT Duration 8 weeks   PT Treatment/Interventions Biofeedback;Patient/family education;Scar mobilization;Manual techniques;Manual lymph drainage;Neuromuscular re-education;Balance training;Therapeutic exercise;Therapeutic activities;Energy conservation   PT Next Visit Plan soft tissue work to abdomen and buttocks, along scars, work on abdomen; work on urinary leakage Internal work    PT Home Exercise Plan progress as needed   Oncologist with Plan of Care Patient      Patient will benefit from skilled therapeutic intervention in order to improve the following deficits and impairments:  Decreased range of motion, Increased fascial restricitons, Increased muscle spasms, Pain, Decreased activity tolerance, Decreased balance, Decreased scar mobility, Impaired flexibility, Decreased strength, Decreased mobility  Visit Diagnosis: Muscle weakness (generalized)  Other muscle spasm  Unspecified lack of coordination  Aftercare following surgery for neoplasm     Problem List Patient Active Problem List   Diagnosis Date Noted  . Colostomy in place [facility]) 07/12/2016  . Colovaginal fistula s/p omentopexy repair 07/12/2016 07/10/2016  . Rectocutaneous fistula 07/10/2016  . Hydroureter on right 12/20/2015  . Abdominal pain 12/19/2015  . Hypokalemia 11/23/2015  . Colorectal delayed anastomotic leak s/p resection & colostomy 07/12/2016 11/17/2015  . Pelvic abscess s/p drainage & omental pedicle flap 11/17/2015 06/23/2015  . Anemia of chronic disease 06/14/2015  . Hypertension   . IBS (irritable bowel syndrome)   . Depression   . Cavernous hemangioma of liver - segments 5 & 7 02/07/2015  . Osteoarthritis of left knee 02/07/2015  . Crohn's disease ([place]) 02/07/2015  . Rectal adenocarcinoma s/p  LAR resection 05/25/2015 01/31/2015  . Pure hypercholesterolemia 02/21/2014  .  Morbid obesity with BMI of 40.0-44.9, adult ([place]) 02/21/2014  . Family history of ischemic heart disease 02/21/2014    Earlie Counts, PT 01/20/17 3:31 PM    [facility] [address], [place], [place], [address] Phone: [phone]   Fax:  [phone]  Name: Lindsay Little MRN: [id number] Date of Birth: [birth year]

## 2017-01-20 NOTE — Patient Instructions (Signed)
PROTOCOL FOR DILATORS   1. Wash dilator with soap and water prior to insertion.    2. Lay on your back reclined. Knees are to be up and apart while on your bed or in the bathtub with warm water.   3. Lubricate the end of the dilator with a water-soluble lubricant.  4. Separate the labia.   5. Tense the pelvic floor muscles than relax; while relaxing, slide lubricated dilator into the vagina.    6. Tense muscles again while holding the dilator so it does not get pushed out; relax and slide it in a little further.   7. Try blowing out as if filling a balloon; this may relax the muscles and allow penetration.  Repeat blowing out to insert dilator further. NO MORE THEN 3/10 PAIN.   8. Keep dilator in for 5- 10 minutes if tolerate, with the pelvic floor muscles relaxed to further stretch the canal.   9. Never force the dilator into the canal.  10. 1-2 times per day   [facility] [address] [place], [place] [address] Phone # [phone] Fax [phone]

## 2017-01-22 ENCOUNTER — Ambulatory Visit: Payer: BLUE CROSS/BLUE SHIELD | Admitting: Physical Therapy

## 2017-01-22 ENCOUNTER — Encounter: Payer: Self-pay | Admitting: Physical Therapy

## 2017-01-22 DIAGNOSIS — Z483 Aftercare following surgery for neoplasm: Secondary | ICD-10-CM

## 2017-01-22 DIAGNOSIS — M6281 Muscle weakness (generalized): Secondary | ICD-10-CM | POA: Diagnosis not present

## 2017-01-22 DIAGNOSIS — M62838 Other muscle spasm: Secondary | ICD-10-CM

## 2017-01-22 DIAGNOSIS — R279 Unspecified lack of coordination: Secondary | ICD-10-CM

## 2017-01-22 NOTE — Therapy (Signed)
[facility] [address], [place], [place], [address] Phone: [phone]   Fax:  [phone]  Physical Therapy Treatment  Patient Details  Name: Lindsay Little MRN: [id number] Date of Birth: Dec 07, 1965 Referring Provider: Dr. Michael Boston  Encounter Date: 01/22/2017      PT End of Session - 01/22/17 1148    Visit Number 14   Date for PT Re-Evaluation 03/10/17   Authorization Type [employer]   Authorization - Visit Number 14   Authorization - Number of Visits 42   PT Start Time 1130   PT Stop Time 1220   PT Time Calculation (min) 50 min   Activity Tolerance Patient tolerated treatment well   Behavior During Therapy [facility] for tasks assessed/performed      Past Medical History:  Diagnosis Date  . Anxiety   . Chemotherapy-induced neuropathy (HCC)    toes and fingers numbness and tingling  . Colostomy in place [facility]) 07/12/2016   due to anastomosis breakdown w/ colovaginal fistula  . Complication of anesthesia   . Depression   . Family history of adverse reaction to anesthesia    mother-- ponv  . Gastroenteritis 12/20/2015  . GERD (gastroesophageal reflux disease)   . Hiatal hernia   . History of cancer chemotherapy 02-20-2015 to 03-30-2015  . History of cardiac murmur as a child   . History of chemotherapy   . History of chronic gastritis   . History of hypertension    no issues since multiple abdminal sx's and chemo--- no medication since 12/ 2016  . History of TMJ disorder   . Hypercholesteremia   . IBS (irritable bowel syndrome)    dx age 73  . Intermittent abdominal pain    post-op multiple abdominal sx's   . Microcytic anemia   . Mild sleep apnea    per pt study 2014  very mild osa , no cpap recommended, recommeded wt loss and sleep routine  . OA (osteoarthritis)    left knee /  left shoulder  . Obesity   . PONV (postoperative nausea and vomiting)    severe" needs Scopolamine PATCH"   . Portacath  in place    right chest  . Rectal adenocarcinoma ([place]) oncologis-  dr Burr Medico-- after radiation/ chemo (ypT1, N0) --  no recurrence per last note 03/ 2018   dx 01-13-2015-- Stage IIIC (T3, N2, M0) post concurrent radiation and chemotherapy 02-20-2015 to 03-30-2015 /  05-25-2015 s/p  LAR w/ RSO (post-op complicated by late abscess and contained anatomotic leak w/ help percutaneous drainage and antibiotics)   . Rotator cuff tear, left   . S/P radiation therapy 02/20/15-03/30/15   colon/rectal  . Vitamin D deficiency   . Wears glasses   . Wears glasses     Past Surgical History:  Procedure Laterality Date  . ABDOMINAL HYSTERECTOMY  1996   uterus and cervix  . COLON RESECTION N/A 07/12/2016   Procedure: LAPAROSCOPIC LYSIS OF ADHESIONS, OMENTOPEXY, HAND ASSISTED RESECTION OF  COLON, END TO END ANASTOMOSIS, COLOSTOMY;  Surgeon: Michael Boston, MD;  [facility];  Service: General;  Laterality: N/A;  . COMBINED HYSTEROSCOPY DIAGNOSTIC / D&C  x2 1990's  . DIAGNOSTIC LAPAROSCOPY  age 66 and age 71  . EUS N/A 01/18/2015   Procedure: LOWER ENDOSCOPIC ULTRASOUND (EUS);  Surgeon: Arta Silence, MD;  Location: [facility];  Service: Endoscopy;  Laterality: N/A;  . EXCISION OF SKIN TAG  11/17/2015   Procedure: EXCISION OF SKIN TAG;  Surgeon:  Michael Boston, MD;  [facility];  Service: General;;  . ILEO LOOP COLOSTOMY CLOSURE N/A 11/17/2015   Procedure: LAPAROSCOPIC DIVERTING LOOP ILEOSTOMY  DRAINAGE OF PELVIC ABSCESS;  Surgeon: Michael Boston, MD;  [facility];  Service: General;  Laterality: N/A;  . ILEOSTOMY CLOSURE N/A 05/31/2016   Procedure: TAKEDOWN LOOP ILEOSTOMY;  Surgeon: Michael Boston, MD;  [facility];  Service: General;  Laterality: N/A;  . IMPACTION REMOVAL  11/17/2015   Procedure: DISIMPACTION REMOVAL;  Surgeon: Michael Boston, MD;  [facility];  Service: General;;  . KNEE ARTHROSCOPY Left 1990's  . KNEE ARTHROSCOPY W/ MENISCECTOMY Left 09/14/2009   and chondroplasty debridement  .  LAPAROSCOPIC LYSIS OF ADHESIONS  11/17/2015   Procedure: LAPAROSCOPIC LYSIS OF ADHESIONS;  Surgeon: Michael Boston, MD;  [facility];  Service: General;;  . PORT-A-CATH REMOVAL N/A 11/21/2016   Procedure: REMOVAL PORT-A-CATH;  Surgeon: Michael Boston, MD;  Location: [facility];  Service: General;  Laterality: N/A;  . PORTACATH PLACEMENT N/A 05/25/2015   Procedure: INSERTION PORT-A-CATH;  Surgeon: Michael Boston, MD;  [facility];  Service: General;  Laterality: N/A;-remains inplace Right chest.  . ROTATOR CUFF REPAIR Right 2006  . TONSILLECTOMY  age 62  . XI ROBOTIC ASSISTED LOWER ANTERIOR RESECTION N/A 05/25/2015   Procedure: XI ROBOTIC ASSISTED LOWER ANTERIOR RESECTION, , RIGID PROCTOSCOPY, RIGHT OOPHORECTOMY;  Surgeon: Michael Boston, MD;  [facility];  Service: General;  Laterality: N/A;    There were no vitals filed for this visit.      Subjective Assessment - 01/22/17 1150    Subjective I have a job interview tomorrow. I saw Dr. Dema Severin who is regulating my pain medication.  I have not had pain medication since Sunday.    Currently in Pain? Yes   Pain Score 6    Pain Location Abdomen   Pain Orientation Lower   Pain Descriptors / Indicators Aching   Pain Type Chronic pain   Pain Onset More than a month ago   Pain Frequency Intermittent   Aggravating Factors  increased movement; end of urination with increased pressure and pain   Pain Relieving Factors after urinating   Multiple Pain Sites No                      Pelvic Floor Special Questions - 01/22/17 0001    Pelvic Floor Internal Exam Patient confirms identification and approves therapist to assess the perineal area   Strength fair squeeze, definite lift           [facility] Adult PT Treatment/Exercise - 01/22/17 0001      Lumbar Exercises: Aerobic   Stationary Bike Nustep L3 only x 15 min     Manual Therapy   Manual Therapy Internal Pelvic Floor;Soft tissue mobilization   Soft tissue  mobilization along the abdominal wall, oblique muscles, transverse abdominus, lower abdominals,    Internal Pelvic Floor bil. sides of obturator internist, levator ani, urethra sphincter and introitus                  PT Short Term Goals - 12/10/16 1143      PT SHORT TERM GOAL #3   Title lower abdominal pain with daily tasks decreased >/= 25%   Time 1   Period Months   Status Achieved     PT SHORT TERM GOAL #4   Title urinary leakage with coughing and sneezing decreased >/= 25%   Time 1  Period Months   Status Achieved     PT SHORT TERM GOAL #5   Title pressure and pain during urination decreased >/= 25%   Time 1   Period Months   Status Achieved           PT Long Term Goals - 01/22/17 1157      PT LONG TERM GOAL #1   Title independent with HEP and understand how to exercise at the pool   Time 2   Period Months   Status On-going  still learnind     PT LONG TERM GOAL #2   Title pressure and pain with urination decreased >/= 75% due to increased mobility of tissue   Time 2   Period Months   Status Achieved  80% better     PT LONG TERM GOAL #3   Title urinary leakage with sneezing and coughing decreased >/= 75% due to increased pelvic floor strength >/= 4/5   Baseline 70% better   Time 2   Period Months   Status On-going     PT LONG TERM GOAL #4   Title lower abdominal pain with increased activity decreased >/= 75% due to improved tissue mobility   Time 2   Period Months   Status On-going  50% better               Plan - 01/22/17 1148    Clinical Impression Statement Patient abdominal pain is decreased by 50%.  Her urinary leakage is 70% better with difficulty with a hard sneeze.  Patient has increased strength of pelvic floor to 3/5.  Patient will benefit from skilled therapy to reduce  pain and overall strength.    Rehab Potential Good   Clinical Impairments Affecting Rehab Potential s/p recta adenocarcinoma; radiation and chemotherapy  02/20/2015 to 03/30/2015; 05/25/2015 recto-sigmoid segmental resection; 05/31/2016 ileostomy take down; 07/12/2016 segmental resection of proimal and distal colon for a colovaginal fistula   PT Frequency 2x / week   PT Duration 8 weeks   PT Treatment/Interventions Biofeedback;Patient/family education;Scar mobilization;Manual techniques;Manual lymph drainage;Neuromuscular re-education;Balance training;Therapeutic exercise;Therapeutic activities;Energy conservation   PT Next Visit Plan soft tissue work to abdomen and buttocks, along scars, work on abdomen; work on urinary leakage Internal work    PT Home Exercise Plan progress as needed   Oncologist with Plan of Care Patient      Patient will benefit from skilled therapeutic intervention in order to improve the following deficits and impairments:  Decreased range of motion, Increased fascial restricitons, Increased muscle spasms, Pain, Decreased activity tolerance, Decreased balance, Decreased scar mobility, Impaired flexibility, Decreased strength, Decreased mobility  Visit Diagnosis: Muscle weakness (generalized)  Other muscle spasm  Unspecified lack of coordination  Aftercare following surgery for neoplasm     Problem List Patient Active Problem List   Diagnosis Date Noted  . Colostomy in place [facility]) 07/12/2016  . Colovaginal fistula s/p omentopexy repair 07/12/2016 07/10/2016  . Rectocutaneous fistula 07/10/2016  . Hydroureter on right 12/20/2015  . Abdominal pain 12/19/2015  . Hypokalemia 11/23/2015  . Colorectal delayed anastomotic leak s/p resection & colostomy 07/12/2016 11/17/2015  . Pelvic abscess s/p drainage & omental pedicle flap 11/17/2015 06/23/2015  . Anemia of chronic disease 06/14/2015  . Hypertension   . IBS (irritable bowel syndrome)   . Depression   . Cavernous hemangioma of liver - segments 5 & 7 02/07/2015  . Osteoarthritis of left knee 02/07/2015  . Crohn's disease ([name]) 02/07/2015  . Rectal  adenocarcinoma  s/p LAR resection 05/25/2015 01/31/2015  . Pure hypercholesterolemia 02/21/2014  . Morbid obesity with BMI of 40.0-44.9, adult ([place]) 02/21/2014  . Family history of ischemic heart disease 02/21/2014    Earlie Counts, PT 01/22/17 12:45 PM    [facility] [address] [place], [place], [address] Phone: [phone]   Fax:  [phone]  Name: Lindsay Little MRN: [id number] Date of Birth: 01-Sep-1965

## 2017-01-27 ENCOUNTER — Ambulatory Visit: Payer: BLUE CROSS/BLUE SHIELD | Admitting: Physical Therapy

## 2017-01-27 ENCOUNTER — Encounter: Payer: Self-pay | Admitting: Physical Therapy

## 2017-01-27 DIAGNOSIS — Z483 Aftercare following surgery for neoplasm: Secondary | ICD-10-CM

## 2017-01-27 DIAGNOSIS — M6281 Muscle weakness (generalized): Secondary | ICD-10-CM | POA: Diagnosis not present

## 2017-01-27 DIAGNOSIS — M62838 Other muscle spasm: Secondary | ICD-10-CM

## 2017-01-27 DIAGNOSIS — R279 Unspecified lack of coordination: Secondary | ICD-10-CM

## 2017-01-27 NOTE — Therapy (Signed)
[facility] [address], [place], [place], [address] Phone: [phone]   Fax:  [phone]  Physical Therapy Treatment  Patient Details  Name: Lindsay Little MRN: [id number] Date of Birth: 1966/06/01 Referring Provider: Dr. Michael Boston  Encounter Date: 01/27/2017      PT End of Session - 01/27/17 1228    Visit Number 15   Date for PT Re-Evaluation 03/10/17   Authorization Type [employer]   Authorization - Visit Number 15   Authorization - Number of Visits 42   PT Start Time 3893   PT Stop Time 1228   PT Time Calculation (min) 43 min   Behavior During Therapy [facility] for tasks assessed/performed      Past Medical History:  Diagnosis Date  . Anxiety   . Chemotherapy-induced neuropathy (HCC)    toes and fingers numbness and tingling  . Colostomy in place [facility]) 07/12/2016   due to anastomosis breakdown w/ colovaginal fistula  . Complication of anesthesia   . Depression   . Family history of adverse reaction to anesthesia    mother-- ponv  . Gastroenteritis 12/20/2015  . GERD (gastroesophageal reflux disease)   . Hiatal hernia   . History of cancer chemotherapy 02-20-2015 to 03-30-2015  . History of cardiac murmur as a child   . History of chemotherapy   . History of chronic gastritis   . History of hypertension    no issues since multiple abdminal sx's and chemo--- no medication since 12/ 2016  . History of TMJ disorder   . Hypercholesteremia   . IBS (irritable bowel syndrome)    dx age 23  . Intermittent abdominal pain    post-op multiple abdominal sx's   . Microcytic anemia   . Mild sleep apnea    per pt study 2014  very mild osa , no cpap recommended, recommeded wt loss and sleep routine  . OA (osteoarthritis)    left knee /  left shoulder  . Obesity   . PONV (postoperative nausea and vomiting)    severe" needs Scopolamine PATCH"   . Portacath in place    right chest  . Rectal adenocarcinoma  ([place]) oncologis-  dr Burr Medico-- after radiation/ chemo (ypT1, N0) --  no recurrence per last note 03/ 2018   dx 01-13-2015-- Stage IIIC (T3, N2, M0) post concurrent radiation and chemotherapy 02-20-2015 to 03-30-2015 /  05-25-2015 s/p  LAR w/ RSO (post-op complicated by late abscess and contained anatomotic leak w/ help percutaneous drainage and antibiotics)   . Rotator cuff tear, left   . S/P radiation therapy 02/20/15-03/30/15   colon/rectal  . Vitamin D deficiency   . Wears glasses   . Wears glasses     Past Surgical History:  Procedure Laterality Date  . ABDOMINAL HYSTERECTOMY  1996   uterus and cervix  . COLON RESECTION N/A 07/12/2016   Procedure: LAPAROSCOPIC LYSIS OF ADHESIONS, OMENTOPEXY, HAND ASSISTED RESECTION OF  COLON, END TO END ANASTOMOSIS, COLOSTOMY;  Surgeon: Michael Boston, MD;  Location: [facility];  Service: General;  Laterality: N/A;  . COMBINED HYSTEROSCOPY DIAGNOSTIC / D&C  x2 1990's  . DIAGNOSTIC LAPAROSCOPY  age 46 and age 22  . EUS N/A 01/18/2015   Procedure: LOWER ENDOSCOPIC ULTRASOUND (EUS);  Surgeon: Arta Silence, MD;  Location: [facility];  Service: Endoscopy;  Laterality: N/A;  . EXCISION OF SKIN TAG  11/17/2015   Procedure: EXCISION OF SKIN TAG;  Surgeon: Michael Boston, MD;  [facility];  Service: General;;  . ILEO LOOP COLOSTOMY CLOSURE N/A 11/17/2015   Procedure: LAPAROSCOPIC DIVERTING LOOP ILEOSTOMY  DRAINAGE OF PELVIC ABSCESS;  Surgeon: Michael Boston, MD;  [facility];  Service: General;  Laterality: N/A;  . ILEOSTOMY CLOSURE N/A 05/31/2016   Procedure: TAKEDOWN LOOP ILEOSTOMY;  Surgeon: Michael Boston, MD;  [facility];  Service: General;  Laterality: N/A;  . IMPACTION REMOVAL  11/17/2015   Procedure: DISIMPACTION REMOVAL;  Surgeon: Michael Boston, MD;  [facility];  Service: General;;  . KNEE ARTHROSCOPY Left 1990's  . KNEE ARTHROSCOPY W/ MENISCECTOMY Left 09/14/2009   and chondroplasty debridement  . LAPAROSCOPIC LYSIS OF ADHESIONS  11/17/2015    Procedure: LAPAROSCOPIC LYSIS OF ADHESIONS;  Surgeon: Michael Boston, MD;  [facility];  Service: General;;  . PORT-A-CATH REMOVAL N/A 11/21/2016   Procedure: REMOVAL PORT-A-CATH;  Surgeon: Michael Boston, MD;  Location: [facility];  Service: General;  Laterality: N/A;  . PORTACATH PLACEMENT N/A 05/25/2015   Procedure: INSERTION PORT-A-CATH;  Surgeon: Michael Boston, MD;  [facility];  Service: General;  Laterality: N/A;-remains inplace Right chest.  . ROTATOR CUFF REPAIR Right 2006  . TONSILLECTOMY  age 51  . XI ROBOTIC ASSISTED LOWER ANTERIOR RESECTION N/A 05/25/2015   Procedure: XI ROBOTIC ASSISTED LOWER ANTERIOR RESECTION, , RIGID PROCTOSCOPY, RIGHT OOPHORECTOMY;  Surgeon: Michael Boston, MD;  [facility];  Service: General;  Laterality: N/A;    There were no vitals filed for this visit.      Subjective Assessment - 01/27/17 1152    Subjective pain is okay.  My Buttocks pain started again.  this weekend Urinary leakage was bad and I had alot of sneezing. I only leak when I sneeze. I have been using the dilator 2 times.    Patient Stated Goals stop drainage, decrease pelvic pain   Currently in Pain? Yes   Pain Score 4    Pain Location Buttocks   Pain Orientation Right;Left   Pain Descriptors / Indicators Aching   Pain Type Chronic pain   Pain Onset More than a month ago   Pain Frequency Intermittent   Aggravating Factors  sitting   Pain Relieving Factors not sitting   Multiple Pain Sites No                         [facility] Adult PT Treatment/Exercise - 01/27/17 0001      Lumbar Exercises: Aerobic   Stationary Bike Nustep L3 only x 15 min     Manual Therapy   Manual Therapy Soft tissue mobilization;Myofascial release   Soft tissue mobilization bil. buttocks, along the scar   Myofascial Release one hand on sacrum an dother on spine releasing the 3 planes of fascia                  PT Short Term Goals - 12/10/16 1143      PT  SHORT TERM GOAL #3   Title lower abdominal pain with daily tasks decreased >/= 25%   Time 1   Period Months   Status Achieved     PT SHORT TERM GOAL #4   Title urinary leakage with coughing and sneezing decreased >/= 25%   Time 1   Period Months   Status Achieved     PT SHORT TERM GOAL #5   Title pressure and pain during urination decreased >/= 25%   Time 1   Period Months   Status Achieved  PT Long Term Goals - 01/27/17 1230      PT LONG TERM GOAL #1   Title independent with HEP and understand how to exercise at the pool   Time 2   Period Months   Status On-going  still learning     PT LONG TERM GOAL #2   Time 2   Status Achieved     PT LONG TERM GOAL #3   Title urinary leakage with sneezing and coughing decreased >/= 75% due to increased pelvic floor strength >/= 4/5   Baseline 70% better   Time 2   Period Months   Status On-going     PT LONG TERM GOAL #4   Title lower abdominal pain with increased activity decreased >/= 75% due to improved tissue mobility   Time 2   Period Months   Status On-going     PT LONG TERM GOAL #5   Title FOTO score </= 50%   Time 2   Period Months   Status On-going               Plan - 01/27/17 1154    Clinical Impression Statement after treatment no pain.  Scar on buttocks are not bound down.  Increased tissue mobility of buttocks.  Patient will leak urine when she sneezes.  Patient will benefit from skilled therapy to reduce pain and overall strength.     Rehab Potential Good   Clinical Impairments Affecting Rehab Potential s/p recta adenocarcinoma; radiation and chemotherapy 02/20/2015 to 03/30/2015; 05/25/2015 recto-sigmoid segmental resection; 05/31/2016 ileostomy take down; 07/12/2016 segmental resection of proimal and distal colon for a colovaginal fistula   PT Frequency 2x / week   PT Duration 8 weeks   PT Treatment/Interventions Biofeedback;Patient/family education;Scar mobilization;Manual  techniques;Manual lymph drainage;Neuromuscular re-education;Balance training;Therapeutic exercise;Therapeutic activities;Energy conservation   PT Next Visit Plan soft tissue work to abdomen and buttocks, along scars, work on abdomen; work on urinary leakage Internal work    PT Home Exercise Plan progress as needed   Oncologist with Plan of Care Patient      Patient will benefit from skilled therapeutic intervention in order to improve the following deficits and impairments:  Decreased range of motion, Increased fascial restricitons, Increased muscle spasms, Pain, Decreased activity tolerance, Decreased balance, Decreased scar mobility, Impaired flexibility, Decreased strength, Decreased mobility  Visit Diagnosis: Muscle weakness (generalized)  Other muscle spasm  Unspecified lack of coordination  Aftercare following surgery for neoplasm     Problem List Patient Active Problem List   Diagnosis Date Noted  . Colostomy in place [facility]) 07/12/2016  . Colovaginal fistula s/p omentopexy repair 07/12/2016 07/10/2016  . Rectocutaneous fistula 07/10/2016  . Hydroureter on right 12/20/2015  . Abdominal pain 12/19/2015  . Hypokalemia 11/23/2015  . Colorectal delayed anastomotic leak s/p resection & colostomy 07/12/2016 11/17/2015  . Pelvic abscess s/p drainage & omental pedicle flap 11/17/2015 06/23/2015  . Anemia of chronic disease 06/14/2015  . Hypertension   . IBS (irritable bowel syndrome)   . Depression   . Cavernous hemangioma of liver - segments 5 & 7 02/07/2015  . Osteoarthritis of left knee 02/07/2015  . Crohn's disease ([place]) 02/07/2015  . Rectal adenocarcinoma s/p LAR resection 05/25/2015 01/31/2015  . Pure hypercholesterolemia 02/21/2014  . Morbid obesity with BMI of 40.0-44.9, adult ([place]) 02/21/2014  . Family history of ischemic heart disease 02/21/2014    Earlie Counts, PT 01/27/17 12:31 PM   [facility] [address],  [place], [place], [address] Phone: [phone]   Fax:  [phone]  Name: Lindsay Little MRN: [id number] Date of Birth: 05/07/1966

## 2017-01-29 ENCOUNTER — Encounter: Payer: No Typology Code available for payment source | Admitting: Physical Therapy

## 2017-02-11 ENCOUNTER — Ambulatory Visit: Payer: BLUE CROSS/BLUE SHIELD | Admitting: Physical Therapy

## 2017-02-13 ENCOUNTER — Encounter: Payer: No Typology Code available for payment source | Admitting: Physical Therapy

## 2017-02-18 ENCOUNTER — Ambulatory Visit: Payer: BLUE CROSS/BLUE SHIELD | Attending: Surgery | Admitting: Physical Therapy

## 2017-02-18 ENCOUNTER — Encounter: Payer: Self-pay | Admitting: Physical Therapy

## 2017-02-18 DIAGNOSIS — R279 Unspecified lack of coordination: Secondary | ICD-10-CM

## 2017-02-18 DIAGNOSIS — Z483 Aftercare following surgery for neoplasm: Secondary | ICD-10-CM

## 2017-02-18 DIAGNOSIS — M62838 Other muscle spasm: Secondary | ICD-10-CM | POA: Diagnosis present

## 2017-02-18 DIAGNOSIS — M6281 Muscle weakness (generalized): Secondary | ICD-10-CM

## 2017-02-18 NOTE — Therapy (Signed)
[facility] [address], [place], [place], [address] Phone: [phone]   Fax:  [phone]  Physical Therapy Treatment  Patient Details  Name: Lindsay Little MRN: [id number] Date of Birth: [birth year] Referring Provider: Dr. Michael Boston  Encounter Date: 02/18/2017      PT End of Session - 02/18/17 1020    Visit Number 16   Authorization Type [employer]   Authorization - Visit Number 16   Authorization - Number of Visits 42   PT Start Time 0240   PT Stop Time 1055   PT Time Calculation (min) 40 min   Activity Tolerance Patient tolerated treatment well   Behavior During Therapy [facility] for tasks assessed/performed      Past Medical History:  Diagnosis Date  . Anxiety   . Chemotherapy-induced neuropathy (HCC)    toes and fingers numbness and tingling  . Colostomy in place [facility]) 07/12/2016   due to anastomosis breakdown w/ colovaginal fistula  . Complication of anesthesia   . Depression   . Family history of adverse reaction to anesthesia    mother-- ponv  . Gastroenteritis 12/20/2015  . GERD (gastroesophageal reflux disease)   . Hiatal hernia   . History of cancer chemotherapy 02-20-2015 to 03-30-2015  . History of cardiac murmur as a child   . History of chemotherapy   . History of chronic gastritis   . History of hypertension    no issues since multiple abdminal sx's and chemo--- no medication since 12/ 2016  . History of TMJ disorder   . Hypercholesteremia   . IBS (irritable bowel syndrome)    dx age 34  . Intermittent abdominal pain    post-op multiple abdominal sx's   . Microcytic anemia   . Mild sleep apnea    per pt study 2014  very mild osa , no cpap recommended, recommeded wt loss and sleep routine  . OA (osteoarthritis)    left knee /  left shoulder  . Obesity   . PONV (postoperative nausea and vomiting)    severe" needs Scopolamine PATCH"   . Portacath in place    right chest  . Rectal  adenocarcinoma ([place]) oncologis-  dr Burr Medico-- after radiation/ chemo (ypT1, N0) --  no recurrence per last note 03/ 2018   dx 01-13-2015-- Stage IIIC (T3, N2, M0) post concurrent radiation and chemotherapy 02-20-2015 to 03-30-2015 /  05-25-2015 s/p  LAR w/ RSO (post-op complicated by late abscess and contained anatomotic leak w/ help percutaneous drainage and antibiotics)   . Rotator cuff tear, left   . S/P radiation therapy 02/20/15-03/30/15   colon/rectal  . Vitamin D deficiency   . Wears glasses   . Wears glasses     Past Surgical History:  Procedure Laterality Date  . ABDOMINAL HYSTERECTOMY  1996   uterus and cervix  . COLON RESECTION N/A 07/12/2016   Procedure: LAPAROSCOPIC LYSIS OF ADHESIONS, OMENTOPEXY, HAND ASSISTED RESECTION OF  COLON, END TO END ANASTOMOSIS, COLOSTOMY;  Surgeon: Michael Boston, MD;  [facility];  Service: General;  Laterality: N/A;  . COMBINED HYSTEROSCOPY DIAGNOSTIC / D&C  x2 1990's  . DIAGNOSTIC LAPAROSCOPY  age 26 and age 25  . EUS N/A 01/18/2015   Procedure: LOWER ENDOSCOPIC ULTRASOUND (EUS);  Surgeon: Arta Silence, MD;  Location: [facility];  Service: Endoscopy;  Laterality: N/A;  . EXCISION OF SKIN TAG  11/17/2015   Procedure: EXCISION OF SKIN TAG;  Surgeon: Michael Boston, MD;  [facility];  Service: General;;  . ILEO LOOP COLOSTOMY CLOSURE N/A 11/17/2015   Procedure: LAPAROSCOPIC DIVERTING LOOP ILEOSTOMY  DRAINAGE OF PELVIC ABSCESS;  Surgeon: Michael Boston, MD;  [facility];  Service: General;  Laterality: N/A;  . ILEOSTOMY CLOSURE N/A 05/31/2016   Procedure: TAKEDOWN LOOP ILEOSTOMY;  Surgeon: Michael Boston, MD;  [facility];  Service: General;  Laterality: N/A;  . IMPACTION REMOVAL  11/17/2015   Procedure: DISIMPACTION REMOVAL;  Surgeon: Michael Boston, MD;  [facility];  Service: General;;  . KNEE ARTHROSCOPY Left 1990's  . KNEE ARTHROSCOPY W/ MENISCECTOMY Left 09/14/2009   and chondroplasty debridement  . LAPAROSCOPIC LYSIS OF ADHESIONS   11/17/2015   Procedure: LAPAROSCOPIC LYSIS OF ADHESIONS;  Surgeon: Michael Boston, MD;  [facility];  Service: General;;  . PORT-A-CATH REMOVAL N/A 11/21/2016   Procedure: REMOVAL PORT-A-CATH;  Surgeon: Michael Boston, MD;  Location: [facility];  Service: General;  Laterality: N/A;  . PORTACATH PLACEMENT N/A 05/25/2015   Procedure: INSERTION PORT-A-CATH;  Surgeon: Michael Boston, MD;  [facility];  Service: General;  Laterality: N/A;-remains inplace Right chest.  . ROTATOR CUFF REPAIR Right 2006  . TONSILLECTOMY  age 8  . XI ROBOTIC ASSISTED LOWER ANTERIOR RESECTION N/A 05/25/2015   Procedure: XI ROBOTIC ASSISTED LOWER ANTERIOR RESECTION, , RIGID PROCTOSCOPY, RIGHT OOPHORECTOMY;  Surgeon: Michael Boston, MD;  [facility];  Service: General;  Laterality: N/A;    There were no vitals filed for this visit.      Subjective Assessment - 02/18/17 1021    Subjective I had to cancel last week due to being sick. Since I went to the beach it has been rough.  I have been moving my legs at night making my legs sore.    Patient Stated Goals stop drainage, decrease pelvic pain   Currently in Pain? Yes   Pain Score 7    Pain Location Buttocks  abdomen, back of thighs   Pain Orientation Right;Left;Lower   Pain Descriptors / Indicators Aching   Pain Type Chronic pain   Pain Onset More than a month ago   Pain Frequency Intermittent   Aggravating Factors  sitting   Pain Relieving Factors not sitting   Multiple Pain Sites No                         [facility] Adult PT Treatment/Exercise - 02/18/17 0001      Lumbar Exercises: Aerobic   Stationary Bike Nustep L3 only x 15 min     Manual Therapy   Manual Therapy Soft tissue mobilization;Myofascial release   Soft tissue mobilization lower abdominal and left abdominal to release soft tissue   Myofascial Release one hand on sacrum an dother on spine releasing the 3 planes of fascia                  PT  Short Term Goals - 12/10/16 1143      PT SHORT TERM GOAL #3   Title lower abdominal pain with daily tasks decreased >/= 25%   Time 1   Period Months   Status Achieved     PT SHORT TERM GOAL #4   Title urinary leakage with coughing and sneezing decreased >/= 25%   Time 1   Period Months   Status Achieved     PT SHORT TERM GOAL #5   Title pressure and pain during urination decreased >/= 25%   Time 1   Period Months  Status Achieved           PT Long Term Goals - 02/18/17 1055      PT LONG TERM GOAL #1   Title independent with HEP and understand how to exercise at the pool   Time 2   Period Months     PT LONG TERM GOAL #2   Title pressure and pain with urination decreased >/= 75% due to increased mobility of tissue   Time 2   Period Months   Status Achieved     PT LONG TERM GOAL #3   Title urinary leakage with sneezing and coughing decreased >/= 75% due to increased pelvic floor strength >/= 4/5   Baseline 70% better   Time 2   Period Months   Status On-going     PT LONG TERM GOAL #4   Title lower abdominal pain with increased activity decreased >/= 75% due to improved tissue mobility   Time 2   Period Months   Status On-going               Plan - 02/18/17 1056    Clinical Impression Statement Patient reports her pain level decreased to 5/10 from 7/10.  Patient had fascial restrictions in lower abdomen and left abdomen. Patient had a flare-up due to doing too much activity at the beach.  Patient has been having restless legs at night increasing musculature pain in bil. hamstring and buttocks.  Patient will benefit from skilled therapy to reduce pain and improve function.    Rehab Potential Good   Clinical Impairments Affecting Rehab Potential s/p recta adenocarcinoma; radiation and chemotherapy 02/20/2015 to 03/30/2015; 05/25/2015 recto-sigmoid segmental resection; 05/31/2016 ileostomy take down; 07/12/2016 segmental resection of proimal and distal colon for  a colovaginal fistula   PT Frequency 2x / week   PT Duration 8 weeks   PT Treatment/Interventions Biofeedback;Patient/family education;Scar mobilization;Manual techniques;Manual lymph drainage;Neuromuscular re-education;Balance training;Therapeutic exercise;Therapeutic activities;Energy conservation   PT Next Visit Plan soft tissue work to abdomen and buttocks, along scars, work on abdomen; work on urinary leakage Internal work    PT Home Exercise Plan progress as needed   Oncologist with Plan of Care Patient      Patient will benefit from skilled therapeutic intervention in order to improve the following deficits and impairments:  Decreased range of motion, Increased fascial restricitons, Increased muscle spasms, Pain, Decreased activity tolerance, Decreased balance, Decreased scar mobility, Impaired flexibility, Decreased strength, Decreased mobility  Visit Diagnosis: Muscle weakness (generalized)  Other muscle spasm  Unspecified lack of coordination  Aftercare following surgery for neoplasm     Problem List Patient Active Problem List   Diagnosis Date Noted  . Colostomy in place [facility] 07/12/2016  . Colovaginal fistula s/p omentopexy repair 07/12/2016 07/10/2016  . Rectocutaneous fistula 07/10/2016  . Hydroureter on right 12/20/2015  . Abdominal pain 12/19/2015  . Hypokalemia 11/23/2015  . Colorectal delayed anastomotic leak s/p resection & colostomy 07/12/2016 11/17/2015  . Pelvic abscess s/p drainage & omental pedicle flap 11/17/2015 06/23/2015  . Anemia of chronic disease 06/14/2015  . Hypertension   . IBS (irritable bowel syndrome)   . Depression   . Cavernous hemangioma of liver - segments 5 & 7 02/07/2015  . Osteoarthritis of left knee 02/07/2015  . Crohn's disease ([place]) 02/07/2015  . Rectal adenocarcinoma s/p LAR resection 05/25/2015 01/31/2015  . Pure hypercholesterolemia 02/21/2014  . Morbid obesity with BMI of 40.0-44.9, adult ([place]) 02/21/2014  . Family  history of ischemic heart disease  02/21/2014    Earlie Counts, PT 02/18/17 10:59 AM   [facility] [address], [place], [place], [address] Phone: [phone]   Fax:  [phone]  Name: Lindsay Little MRN: [id number] Date of Birth: 1966-04-07

## 2017-02-20 ENCOUNTER — Encounter: Payer: Self-pay | Admitting: Physical Therapy

## 2017-02-20 ENCOUNTER — Ambulatory Visit: Payer: BLUE CROSS/BLUE SHIELD | Admitting: Physical Therapy

## 2017-02-20 DIAGNOSIS — R279 Unspecified lack of coordination: Secondary | ICD-10-CM

## 2017-02-20 DIAGNOSIS — Z483 Aftercare following surgery for neoplasm: Secondary | ICD-10-CM

## 2017-02-20 DIAGNOSIS — M6281 Muscle weakness (generalized): Secondary | ICD-10-CM

## 2017-02-20 DIAGNOSIS — M62838 Other muscle spasm: Secondary | ICD-10-CM

## 2017-02-20 NOTE — Therapy (Signed)
[facility] [address] [place], [place], [address] Phone: [phone]   Fax:  [phone]  Physical Therapy Treatment  Patient Details  Name: Lindsay Little MRN: [id number] Date of Birth: [birth year] Referring Provider: Dr. Michael Boston  Encounter Date: 02/20/2017      PT End of Session - 02/20/17 1052    Visit Number 17   Date for PT Re-Evaluation 03/10/17   Authorization Type [employer]   Authorization - Visit Number 17   Authorization - Number of Visits 42   PT Start Time 7253   PT Stop Time 1055   PT Time Calculation (min) 40 min   Activity Tolerance Patient tolerated treatment well   Behavior During Therapy [facility] for tasks assessed/performed      Past Medical History:  Diagnosis Date  . Anxiety   . Chemotherapy-induced neuropathy (HCC)    toes and fingers numbness and tingling  . Colostomy in place [facility]) 07/12/2016   due to anastomosis breakdown w/ colovaginal fistula  . Complication of anesthesia   . Depression   . Family history of adverse reaction to anesthesia    mother-- ponv  . Gastroenteritis 12/20/2015  . GERD (gastroesophageal reflux disease)   . Hiatal hernia   . History of cancer chemotherapy 02-20-2015 to 03-30-2015  . History of cardiac murmur as a child   . History of chemotherapy   . History of chronic gastritis   . History of hypertension    no issues since multiple abdminal sx's and chemo--- no medication since 12/ 2016  . History of TMJ disorder   . Hypercholesteremia   . IBS (irritable bowel syndrome)    dx age 8  . Intermittent abdominal pain    post-op multiple abdominal sx's   . Microcytic anemia   . Mild sleep apnea    per pt study 2014  very mild osa , no cpap recommended, recommeded wt loss and sleep routine  . OA (osteoarthritis)    left knee /  left shoulder  . Obesity   . PONV (postoperative nausea and vomiting)    severe" needs Scopolamine PATCH"   . Portacath  in place    right chest  . Rectal adenocarcinoma ([place]) oncologis-  dr Burr Medico-- after radiation/ chemo (ypT1, N0) --  no recurrence per last note 03/ 2018   dx 01-13-2015-- Stage IIIC (T3, N2, M0) post concurrent radiation and chemotherapy 02-20-2015 to 03-30-2015 /  05-25-2015 s/p  LAR w/ RSO (post-op complicated by late abscess and contained anatomotic leak w/ help percutaneous drainage and antibiotics)   . Rotator cuff tear, left   . S/P radiation therapy 02/20/15-03/30/15   colon/rectal  . Vitamin D deficiency   . Wears glasses   . Wears glasses     Past Surgical History:  Procedure Laterality Date  . ABDOMINAL HYSTERECTOMY  1996   uterus and cervix  . COLON RESECTION N/A 07/12/2016   Procedure: LAPAROSCOPIC LYSIS OF ADHESIONS, OMENTOPEXY, HAND ASSISTED RESECTION OF  COLON, END TO END ANASTOMOSIS, COLOSTOMY;  Surgeon: Michael Boston, MD;  [facility]: [facility];  Service: General;  Laterality: N/A;  . COMBINED HYSTEROSCOPY DIAGNOSTIC / D&C  x2 1990's  . DIAGNOSTIC LAPAROSCOPY  age 3 and age 62  . EUS N/A 01/18/2015   Procedure: LOWER ENDOSCOPIC ULTRASOUND (EUS);  Surgeon: Arta Silence, MD;  Location: [facility];  Service: Endoscopy;  Laterality: N/A;  . EXCISION OF SKIN TAG  11/17/2015   Procedure: EXCISION OF SKIN TAG;  Surgeon:  Michael Boston, MD;  [facility];  Service: General;;  . ILEO LOOP COLOSTOMY CLOSURE N/A 11/17/2015   Procedure: LAPAROSCOPIC DIVERTING LOOP ILEOSTOMY  DRAINAGE OF PELVIC ABSCESS;  Surgeon: Michael Boston, MD;  [facility];  Service: General;  Laterality: N/A;  . ILEOSTOMY CLOSURE N/A 05/31/2016   Procedure: TAKEDOWN LOOP ILEOSTOMY;  Surgeon: Michael Boston, MD;  [facility];  Service: General;  Laterality: N/A;  . IMPACTION REMOVAL  11/17/2015   Procedure: DISIMPACTION REMOVAL;  Surgeon: Michael Boston, MD;  [facility];  Service: General;;  . KNEE ARTHROSCOPY Left 1990's  . KNEE ARTHROSCOPY W/ MENISCECTOMY Left 09/14/2009   and chondroplasty debridement  .  LAPAROSCOPIC LYSIS OF ADHESIONS  11/17/2015   Procedure: LAPAROSCOPIC LYSIS OF ADHESIONS;  Surgeon: Michael Boston, MD;  [facility];  Service: General;;  . PORT-A-CATH REMOVAL N/A 11/21/2016   Procedure: REMOVAL PORT-A-CATH;  Surgeon: Michael Boston, MD;  Location: [facility];  Service: General;  Laterality: N/A;  . PORTACATH PLACEMENT N/A 05/25/2015   Procedure: INSERTION PORT-A-CATH;  Surgeon: Michael Boston, MD;  [facility];  Service: General;  Laterality: N/A;-remains inplace Right chest.  . ROTATOR CUFF REPAIR Right 2006  . TONSILLECTOMY  age 58  . XI ROBOTIC ASSISTED LOWER ANTERIOR RESECTION N/A 05/25/2015   Procedure: XI ROBOTIC ASSISTED LOWER ANTERIOR RESECTION, , RIGID PROCTOSCOPY, RIGHT OOPHORECTOMY;  Surgeon: Michael Boston, MD;  [facility];  Service: General;  Laterality: N/A;    There were no vitals filed for this visit.      Subjective Assessment - 02/20/17 1023    Subjective I feel the vaginal area is doing well.  I have not been able to get close to my husband due to him feeling well. I was able to sit going to the beach without buttocks pain.    Patient Stated Goals stop drainage, decrease pelvic pain   Currently in Pain? Yes   Pain Score 4    Pain Location Abdomen   Pain Orientation Left;Lower   Pain Descriptors / Indicators Aching   Pain Type Chronic pain   Pain Onset More than a month ago   Pain Frequency Intermittent   Aggravating Factors  sitting   Pain Relieving Factors not sitting   Multiple Pain Sites No                         [facility] Adult PT Treatment/Exercise - 02/20/17 0001      Lumbar Exercises: Stretches   Quad Stretch 2 reps;30 seconds  bil. sides in Development worker, international aid Limitations bil. gastroc stretch 2 x     Lumbar Exercises: Aerobic   Stationary Bike Nustep L3 only x 15 min     Manual Therapy   Manual Therapy Soft tissue mobilization;Myofascial release  hips in air to bring intestines upward    Soft tissue mobilization lower abdominal and left abdominal to release soft tissue   Myofascial Release release of the left side of abdominal tissue                  PT Short Term Goals - 12/10/16 1143      PT SHORT TERM GOAL #3   Title lower abdominal pain with daily tasks decreased >/= 25%   Time 1   Period Months   Status Achieved     PT SHORT TERM GOAL #4   Title urinary leakage with coughing and sneezing decreased >/= 25%  Time 1   Period Months   Status Achieved     PT SHORT TERM GOAL #5   Title pressure and pain during urination decreased >/= 25%   Time 1   Period Months   Status Achieved           PT Long Term Goals - 02/18/17 1055      PT LONG TERM GOAL #1   Title independent with HEP and understand how to exercise at the pool   Time 2   Period Months     PT LONG TERM GOAL #2   Title pressure and pain with urination decreased >/= 75% due to increased mobility of tissue   Time 2   Period Months   Status Achieved     PT LONG TERM GOAL #3   Title urinary leakage with sneezing and coughing decreased >/= 75% due to increased pelvic floor strength >/= 4/5   Baseline 70% better   Time 2   Period Months   Status On-going     PT LONG TERM GOAL #4   Title lower abdominal pain with increased activity decreased >/= 75% due to improved tissue mobility   Time 2   Period Months   Status On-going               Plan - 02/20/17 1052    Clinical Impression Statement Patient reports no urinary leakage for several weeks. Patient has increased tenderness located in lower left abdominal area.  After therapy pain was decreased. Patient will benefit from skilled therapy to reduce pain and improve function.    Rehab Potential Good   Clinical Impairments Affecting Rehab Potential s/p recta adenocarcinoma; radiation and chemotherapy 02/20/2015 to 03/30/2015; 05/25/2015 recto-sigmoid segmental resection; 05/31/2016 ileostomy take down; 07/12/2016 segmental  resection of proimal and distal colon for a colovaginal fistula   PT Frequency 2x / week   PT Duration 8 weeks   PT Treatment/Interventions Biofeedback;Patient/family education;Scar mobilization;Manual techniques;Manual lymph drainage;Neuromuscular re-education;Balance training;Therapeutic exercise;Therapeutic activities;Energy conservation   PT Next Visit Plan soft tissue work to abdomen and buttocks, along scars, work on abdomen;   PT Home Exercise Plan progress as needed   Consulted and Agree with Plan of Care Patient      Patient will benefit from skilled therapeutic intervention in order to improve the following deficits and impairments:  Decreased range of motion, Increased fascial restricitons, Increased muscle spasms, Pain, Decreased activity tolerance, Decreased balance, Decreased scar mobility, Impaired flexibility, Decreased strength, Decreased mobility  Visit Diagnosis: Muscle weakness (generalized)  Other muscle spasm  Unspecified lack of coordination  Aftercare following surgery for neoplasm     Problem List Patient Active Problem List   Diagnosis Date Noted  . Colostomy in place [facility]) 07/12/2016  . Colovaginal fistula s/p omentopexy repair 07/12/2016 07/10/2016  . Rectocutaneous fistula 07/10/2016  . Hydroureter on right 12/20/2015  . Abdominal pain 12/19/2015  . Hypokalemia 11/23/2015  . Colorectal delayed anastomotic leak s/p resection & colostomy 07/12/2016 11/17/2015  . Pelvic abscess s/p drainage & omental pedicle flap 11/17/2015 06/23/2015  . Anemia of chronic disease 06/14/2015  . Hypertension   . IBS (irritable bowel syndrome)   . Depression   . Cavernous hemangioma of liver - segments 5 & 7 02/07/2015  . Osteoarthritis of left knee 02/07/2015  . Crohn's disease ([place]) 02/07/2015  . Rectal adenocarcinoma s/p LAR resection 05/25/2015 01/31/2015  . Pure hypercholesterolemia 02/21/2014  . Morbid obesity with BMI of 40.0-44.9, adult ([place]) 02/21/2014  .  Family  history of ischemic heart disease 02/21/2014    Earlie Counts, PT 02/20/17 10:55 AM   [facility] [address] [place], [place], [address] Phone: [phone]   Fax:  [phone]  Name: Lindsay Little MRN: [id number] Date of Birth: 05-Dec-1965

## 2017-02-24 ENCOUNTER — Encounter: Payer: Self-pay | Admitting: Physical Therapy

## 2017-02-24 ENCOUNTER — Ambulatory Visit: Payer: BLUE CROSS/BLUE SHIELD | Admitting: Physical Therapy

## 2017-02-24 DIAGNOSIS — M6281 Muscle weakness (generalized): Secondary | ICD-10-CM | POA: Diagnosis not present

## 2017-02-24 DIAGNOSIS — M62838 Other muscle spasm: Secondary | ICD-10-CM

## 2017-02-24 DIAGNOSIS — Z483 Aftercare following surgery for neoplasm: Secondary | ICD-10-CM

## 2017-02-24 DIAGNOSIS — R279 Unspecified lack of coordination: Secondary | ICD-10-CM

## 2017-02-24 NOTE — Therapy (Signed)
[facility] [address], [place], [place], [address] Phone: [phone]   Fax:  [phone]  Physical Therapy Treatment  Patient Details  Name: Lindsay Little MRN: [id number] Date of Birth: [birth year] Referring Provider: Dr. Michael Boston  Encounter Date: 02/24/2017      PT End of Session - 02/24/17 1105    Visit Number 18   Date for PT Re-Evaluation 03/10/17   Authorization Type [employer]   Authorization - Visit Number 18   Authorization - Number of Visits 42   PT Start Time 1103   PT Stop Time 1141   PT Time Calculation (min) 38 min   Activity Tolerance Patient tolerated treatment well   Behavior During Therapy [facility] for tasks assessed/performed      Past Medical History:  Diagnosis Date  . Anxiety   . Chemotherapy-induced neuropathy (HCC)    toes and fingers numbness and tingling  . Colostomy in place [facility]) 07/12/2016   due to anastomosis breakdown w/ colovaginal fistula  . Complication of anesthesia   . Depression   . Family history of adverse reaction to anesthesia    mother-- ponv  . Gastroenteritis 12/20/2015  . GERD (gastroesophageal reflux disease)   . Hiatal hernia   . History of cancer chemotherapy 02-20-2015 to 03-30-2015  . History of cardiac murmur as a child   . History of chemotherapy   . History of chronic gastritis   . History of hypertension    no issues since multiple abdminal sx's and chemo--- no medication since 12/ 2016  . History of TMJ disorder   . Hypercholesteremia   . IBS (irritable bowel syndrome)    dx age 38  . Intermittent abdominal pain    post-op multiple abdominal sx's   . Microcytic anemia   . Mild sleep apnea    per pt study 2014  very mild osa , no cpap recommended, recommeded wt loss and sleep routine  . OA (osteoarthritis)    left knee /  left shoulder  . Obesity   . PONV (postoperative nausea and vomiting)    severe" needs Scopolamine PATCH"   . Portacath  in place    right chest  . Rectal adenocarcinoma ([place]) oncologis-  dr Burr Medico-- after radiation/ chemo (ypT1, N0) --  no recurrence per last note 03/ 2018   dx 01-13-2015-- Stage IIIC (T3, N2, M0) post concurrent radiation and chemotherapy 02-20-2015 to 03-30-2015 /  05-25-2015 s/p  LAR w/ RSO (post-op complicated by late abscess and contained anatomotic leak w/ help percutaneous drainage and antibiotics)   . Rotator cuff tear, left   . S/P radiation therapy 02/20/15-03/30/15   colon/rectal  . Vitamin D deficiency   . Wears glasses   . Wears glasses     Past Surgical History:  Procedure Laterality Date  . ABDOMINAL HYSTERECTOMY  1996   uterus and cervix  . COLON RESECTION N/A 07/12/2016   Procedure: LAPAROSCOPIC LYSIS OF ADHESIONS, OMENTOPEXY, HAND ASSISTED RESECTION OF  COLON, END TO END ANASTOMOSIS, COLOSTOMY;  Surgeon: Michael Boston, MD;  [facility];  Service: General;  Laterality: N/A;  . COMBINED HYSTEROSCOPY DIAGNOSTIC / D&C  x2 1990's  . DIAGNOSTIC LAPAROSCOPY  age 17 and age 62  . EUS N/A 01/18/2015   Procedure: LOWER ENDOSCOPIC ULTRASOUND (EUS);  Surgeon: Arta Silence, MD;  Location: [facility];  Service: Endoscopy;  Laterality: N/A;  . EXCISION OF SKIN TAG  11/17/2015   Procedure: EXCISION OF SKIN TAG;  Surgeon:  Michael Boston, MD;  [facility];  Service: General;;  . ILEO LOOP COLOSTOMY CLOSURE N/A 11/17/2015   Procedure: LAPAROSCOPIC DIVERTING LOOP ILEOSTOMY  DRAINAGE OF PELVIC ABSCESS;  Surgeon: Michael Boston, MD;  [facility];  Service: General;  Laterality: N/A;  . ILEOSTOMY CLOSURE N/A 05/31/2016   Procedure: TAKEDOWN LOOP ILEOSTOMY;  Surgeon: Michael Boston, MD;  [facility];  Service: General;  Laterality: N/A;  . IMPACTION REMOVAL  11/17/2015   Procedure: DISIMPACTION REMOVAL;  Surgeon: Michael Boston, MD;  [facility];  Service: General;;  . KNEE ARTHROSCOPY Left 1990's  . KNEE ARTHROSCOPY W/ MENISCECTOMY Left 09/14/2009   and chondroplasty debridement  .  LAPAROSCOPIC LYSIS OF ADHESIONS  11/17/2015   Procedure: LAPAROSCOPIC LYSIS OF ADHESIONS;  Surgeon: Michael Boston, MD;  [facility];  Service: General;;  . PORT-A-CATH REMOVAL N/A 11/21/2016   Procedure: REMOVAL PORT-A-CATH;  Surgeon: Michael Boston, MD;  Location: [facility];  Service: General;  Laterality: N/A;  . PORTACATH PLACEMENT N/A 05/25/2015   Procedure: INSERTION PORT-A-CATH;  Surgeon: Michael Boston, MD;  [facility];  Service: General;  Laterality: N/A;-remains inplace Right chest.  . ROTATOR CUFF REPAIR Right 2006  . TONSILLECTOMY  age 21  . XI ROBOTIC ASSISTED LOWER ANTERIOR RESECTION N/A 05/25/2015   Procedure: XI ROBOTIC ASSISTED LOWER ANTERIOR RESECTION, , RIGID PROCTOSCOPY, RIGHT OOPHORECTOMY;  Surgeon: Michael Boston, MD;  [facility];  Service: General;  Laterality: N/A;    There were no vitals filed for this visit.          [facility] PT Assessment - 02/24/17 0001      Assessment   Medical Diagnosis R10.2 Chronic Pelvic Pain in Female   Onset Date/Surgical Date 05/25/15   Prior Therapy None     Precautions   Precautions Other (comment)   Precaution Comments cancer     Restrictions   Weight Bearing Restrictions No     Balance Screen   Has the patient fallen in the past 6 months No   Has the patient had a decrease in activity level because of a fear of falling?  No   Is the patient reluctant to leave their home because of a fear of falling?  No     Home Environment   Living Environment Private residence   Type of [place] to enter   [place] One level     Prior Function   Level of Independence Independent   Vocation On disability   Vocation Requirements get back to CMA-vitals, standing on feet,    Leisure walking     Cognition   Overall Cognitive Status Within Functional Limits for tasks assessed     Observation/Other Assessments   Focus on Therapeutic Outcomes (FOTO)  29% limitation      Posture/Postural Control   Posture/Postural Control No significant limitations     Strength   Right Hip Extension 5/5   Right Hip ABduction 5/5   Right Hip ADduction 5/5   Left Hip ABduction 5/5     Palpation   SI assessment  pelvis in correct alignment     Transfers   Transfers Independent with all Transfers  has some difficulty due to abdominal weakness     Ambulation/Gait   Ambulation/Gait No                     [facility] Adult PT Treatment/Exercise - 02/24/17 0001      Manual  Therapy   Manual Therapy Soft tissue mobilization   Soft tissue mobilization lower abdominal and left abdominal to release soft tissue   Myofascial Release release of the left side of abdominal tissue                PT Education - 02/24/17 1118    Education provided Yes   Education Details reviewed past HEP; added pelvic contraction in sitting, TA progression   Person(s) Educated Patient   Methods Explanation;Demonstration;Verbal cues;Handout   Comprehension Returned demonstration;Verbalized understanding          PT Short Term Goals - 12/10/16 1143      PT SHORT TERM GOAL #3   Title lower abdominal pain with daily tasks decreased >/= 25%   Time 1   Period Months   Status Achieved     PT SHORT TERM GOAL #4   Title urinary leakage with coughing and sneezing decreased >/= 25%   Time 1   Period Months   Status Achieved     PT SHORT TERM GOAL #5   Title pressure and pain during urination decreased >/= 25%   Time 1   Period Months   Status Achieved           PT Long Term Goals - 02/24/17 1108      PT LONG TERM GOAL #1   Title independent with HEP and understand how to exercise at the pool   Time 2   Period Months   Status Achieved     PT LONG TERM GOAL #2   Title pressure and pain with urination decreased >/= 75% due to increased mobility of tissue   Time 2   Period Months   Status Achieved     PT LONG TERM GOAL #3   Title urinary leakage with  sneezing and coughing decreased >/= 75% due to increased pelvic floor strength >/= 4/5   Time 2   Period Months   Status Achieved     PT LONG TERM GOAL #4   Title lower abdominal pain with increased activity decreased >/= 75% due to improved tissue mobility   Time 2   Period Months   Status Achieved     PT LONG TERM GOAL #5   Title FOTO score </= 50%   Time 2   Period Months   Status Achieved               Plan - 02/24/17 1106    Clinical Impression Statement Patient has met all of her goals.  Patient abdominal pain has decreased by 80%. She understands how to perform soft tissue work to the abdomen to reduce fascial restrictions and use her dilator in the vaginal area. Patient has increased bil. hip strength and endurance.  Patient is ready for discharge to HEP.    Rehab Potential Good   Clinical Impairments Affecting Rehab Potential s/p recta adenocarcinoma; radiation and chemotherapy 02/20/2015 to 03/30/2015; 05/25/2015 recto-sigmoid segmental resection; 05/31/2016 ileostomy take down; 07/12/2016 segmental resection of proimal and distal colon for a colovaginal fistula   PT Frequency 2x / week   PT Duration 8 weeks   PT Treatment/Interventions Biofeedback;Patient/family education;Scar mobilization;Manual techniques;Manual lymph drainage;Neuromuscular re-education;Balance training;Therapeutic exercise;Therapeutic activities;Energy conservation   PT Next Visit Plan Discharge to HEP today   PT Home Exercise Plan Discharge to HEP   Consulted and Agree with Plan of Care Patient      Patient will benefit from skilled therapeutic intervention in order to improve the following deficits  and impairments:  Decreased range of motion, Increased fascial restricitons, Increased muscle spasms, Pain, Decreased activity tolerance, Decreased balance, Decreased scar mobility, Impaired flexibility, Decreased strength, Decreased mobility  Visit Diagnosis: Muscle weakness (generalized)  Other  muscle spasm  Unspecified lack of coordination  Aftercare following surgery for neoplasm     Problem List Patient Active Problem List   Diagnosis Date Noted  . Colostomy in place [facility]) 07/12/2016  . Colovaginal fistula s/p omentopexy repair 07/12/2016 07/10/2016  . Rectocutaneous fistula 07/10/2016  . Hydroureter on right 12/20/2015  . Abdominal pain 12/19/2015  . Hypokalemia 11/23/2015  . Colorectal delayed anastomotic leak s/p resection & colostomy 07/12/2016 11/17/2015  . Pelvic abscess s/p drainage & omental pedicle flap 11/17/2015 06/23/2015  . Anemia of chronic disease 06/14/2015  . Hypertension   . IBS (irritable bowel syndrome)   . Depression   . Cavernous hemangioma of liver - segments 5 & 7 02/07/2015  . Osteoarthritis of left knee 02/07/2015  . Crohn's disease ([place]) 02/07/2015  . Rectal adenocarcinoma s/p LAR resection 05/25/2015 01/31/2015  . Pure hypercholesterolemia 02/21/2014  . Morbid obesity with BMI of 40.0-44.9, adult ([place]) 02/21/2014  . Family history of ischemic heart disease 02/21/2014    Earlie Counts, PT 02/24/17 11:43 AM    [facility] [address], [place], [place], [address] Phone: [phone]   Fax:  [phone]  Name: KAITY PITSTICK MRN: [id number] Date of Birth: [birth year]  PHYSICAL THERAPY DISCHARGE SUMMARY  Visits from Start of Care: 18  Current functional level related to goals / functional outcomes: See above.    Remaining deficits: See above.   Education / Equipment: HEP Plan: Patient agrees to discharge.  Patient goals were met. Patient is being discharged due to meeting the stated rehab goals. Thank you for the referral. Earlie Counts, PT 02/24/17 11:43 AM   ?????

## 2017-02-24 NOTE — Patient Instructions (Addendum)
Quick Contraction: Gravity Resisted (Sitting)    Sitting, quickly squeeze then fully relax pelvic floor. Perform _1__ sets of _5__. Rest for _1__ seconds between sets. Do _3__ times a day.  Copyright  VHI. All rights reserved.    Slow Contraction: Gravity Resisted (Sitting)    Sitting, slowly squeeze pelvic floor for _10__ seconds. Rest for _10__ seconds. Repeat 10___ times. Do _3__ times a day.  Copyright  VHI. All rights reserved.  Lower abdominal/core stability exercises  1. Practice your breathing technique: Inhale through your nose expanding your belly and rib cage. Try not to breathe into your chest. Exhale slowly and gradually out your mouth feeling a sense of softness to your body. Practice multiple times. This can be performed unlimited.  2. Finding the lower abdominals. Laying on your back with the knees bent, place your fingers just below your belly button. Using your breathing technique from above, on your exhale gently pull the belly button away from your fingertips without tensing any other muscles. Practice this 5x. Next, as you exhale, draw belly button inwards and hold onto it...then feel as if you are pulling that muscle across your pelvis like you are tightening a belt. This can be hard to do at first so be patient and practice. Do 5-10 reps 1-3 x day. Always recognize quality over quantity; if your abdominal muscles become tired you will notice you may tighten/contract other muscles. This is the time to take a break.   Practice this first laying on your back, then in sitting, progressing to standing and finally adding it to all your daily movements.   3. Finding your pelvic floor. Using the breathing technique above, when your exhale, this time draw your pelvic floor muscles up as if you were attempting to stop the flow of urination. Be careful NOT to tense any other muscles. This can be hard, BE PATIENT. Try to hold up to 10 seconds repeating 10x. Try 2x a day. Once  you feel you are doing this well, add this contraction to exercise #2. First contracting your pelvic floor followed by lower abdominals.   4. Adding leg movements. Add the following leg movements to challenge your ability to keep your core stable:  1. Single leg drop outs: Laying on your back with knees bent feet flat. Inhale,  dropping one knee outward KEEPING YOUR PELVIS STILL. Exhale as you bring the leg back, simultaneously performing your lower abdominal contraction. Do 5-10 on each leg.   2. Marching: While keeping your pelvis still, lift the right foot a few inches, put it down then lift left foot. This will mimic a march. Start slow to establish control. Once you have control you may speed it up. Do 10-20x. You MUST keep your lower abdominlas contracted while you march. Breathe naturally    3. Single leg slides: Inhale while you slowly slide one leg out keeping your pelvis still. Only slide your leg as far as you can keep your pelvis still. Exhale as you bring the leg back to the start, contracting the lower abdominals as you do that. Keep your upper body relaxed. Do 5-10 on each side.    [facility] [address], [place], [place] [address] Phone # [phone] Fax [phone]

## 2017-02-26 ENCOUNTER — Encounter: Payer: Self-pay | Admitting: Physical Therapy

## 2017-03-03 ENCOUNTER — Encounter: Payer: Self-pay | Admitting: Physical Therapy

## 2017-03-05 ENCOUNTER — Encounter: Payer: Self-pay | Admitting: Physical Therapy

## 2017-03-10 ENCOUNTER — Encounter: Payer: Self-pay | Admitting: Physical Therapy

## 2017-03-12 ENCOUNTER — Encounter: Payer: Self-pay | Admitting: Physical Therapy

## 2017-04-09 IMAGING — US US ABDOMEN LIMITED
1 series · 14 of 25 positions shown · non-contrast
Comparison: CT scan 12/19/2015

CLINICAL DATA: Right upper quadrant pain

EXAM:
US ABDOMEN LIMITED - RIGHT UPPER QUADRANT

[Series 1: us abdomen limited · 0.22mm/px · 14 of 71 slices shown]
[im 1/71]
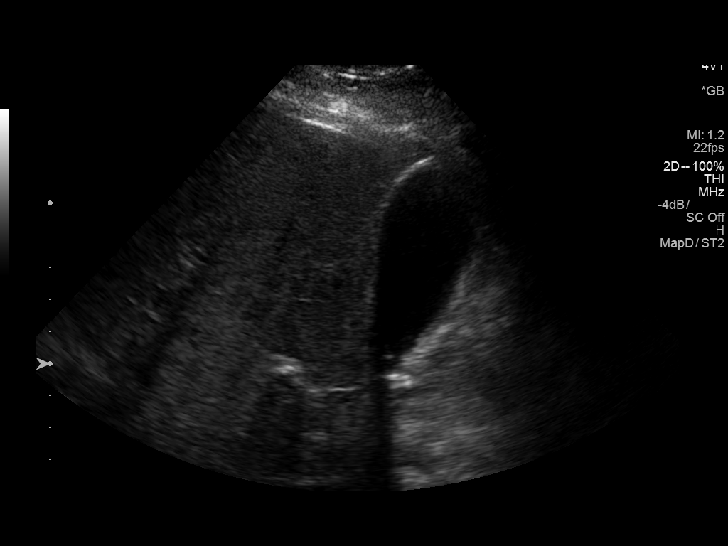
[im 6/71]
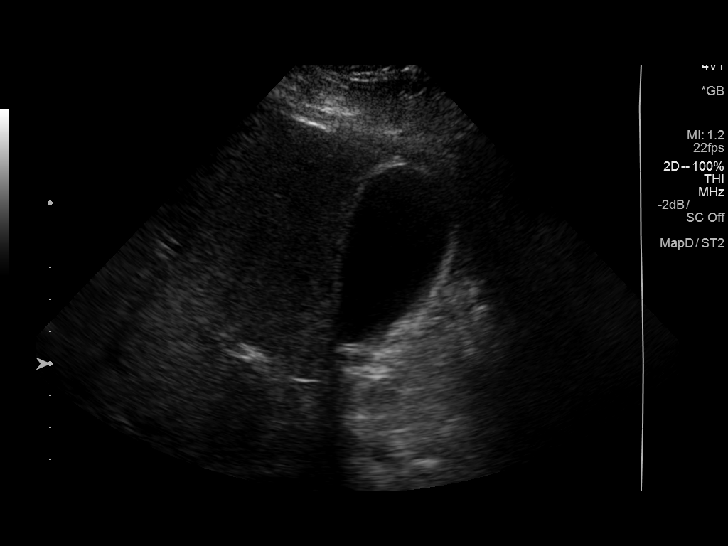
[im 12/71]
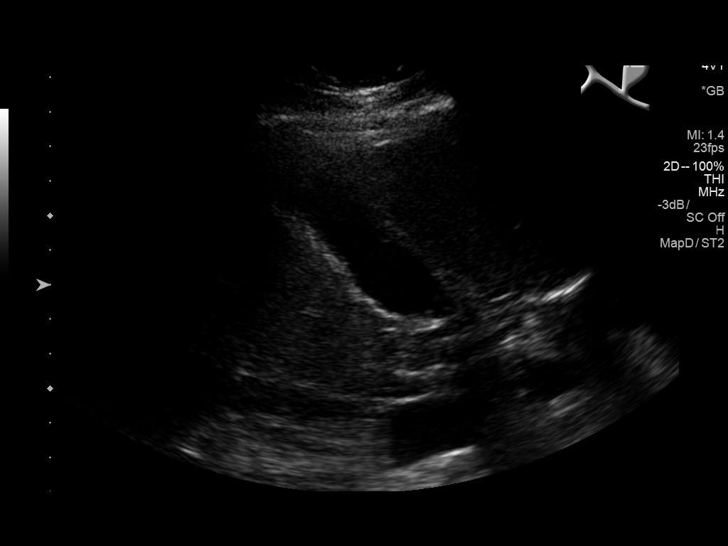
[im 18/71]
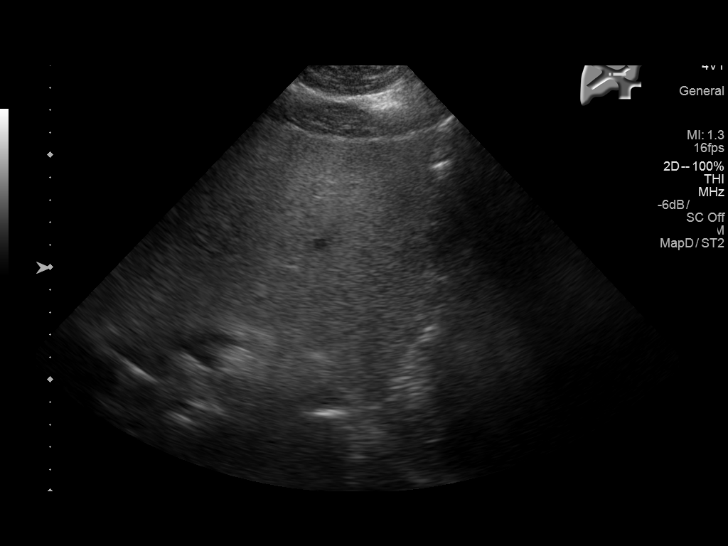
[im 24/71]
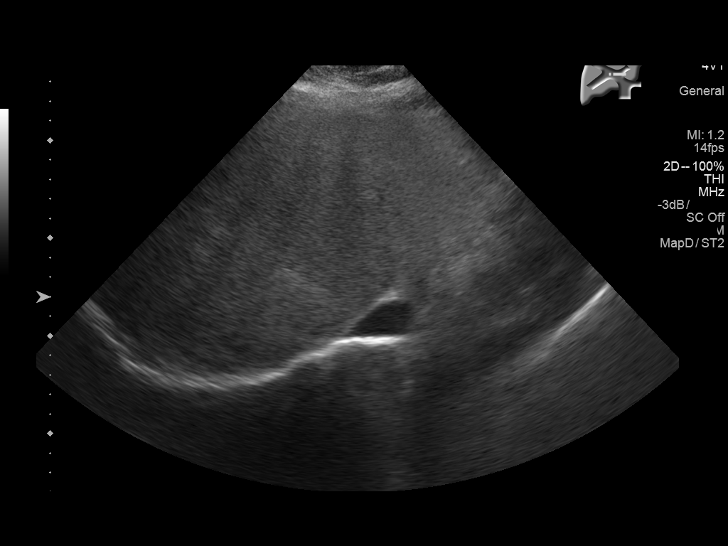
[im 27/71]
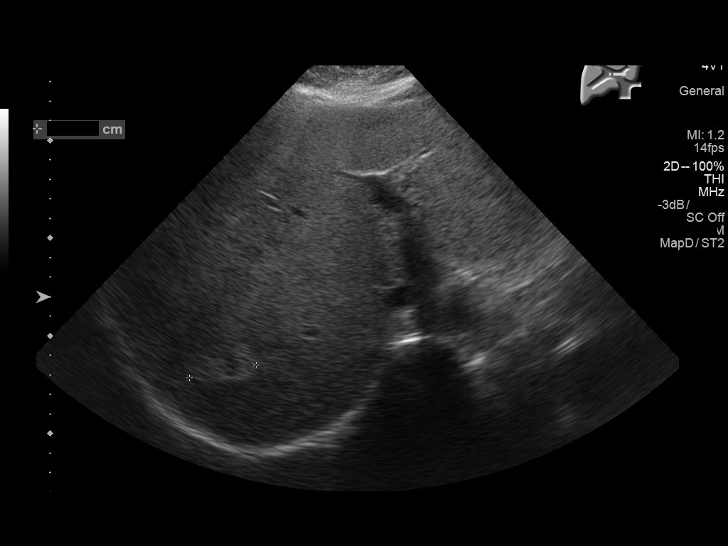
[im 33/71]
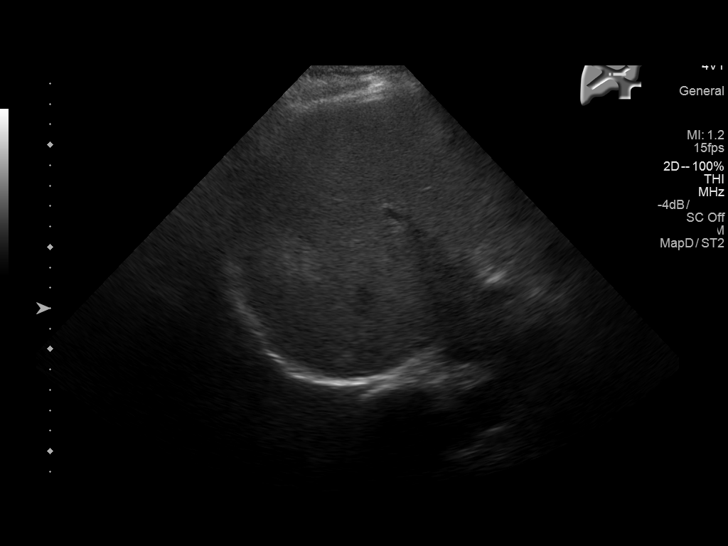
[im 38/71]
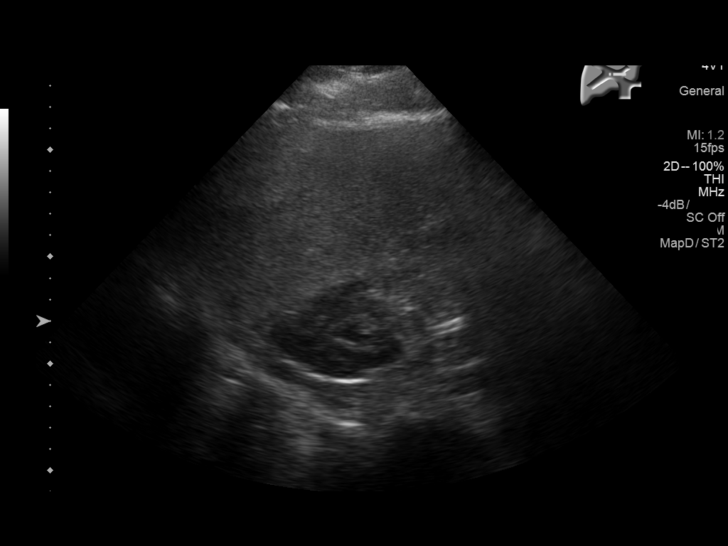
[im 44/71]
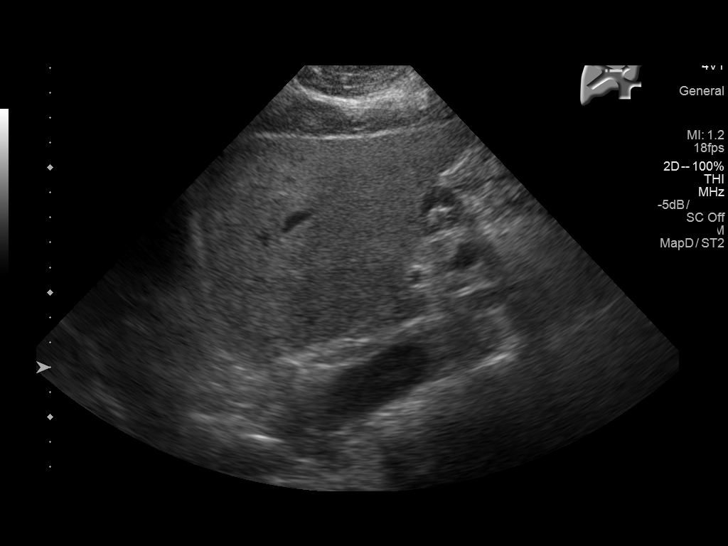
[im 47/71]
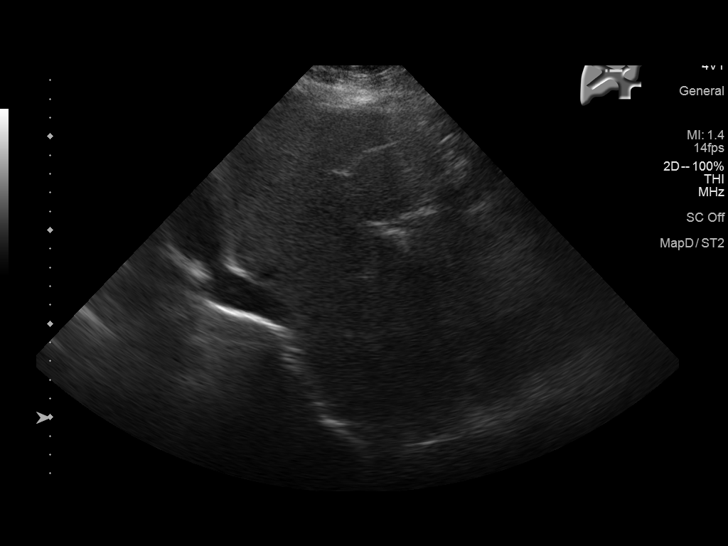
[im 53/71]
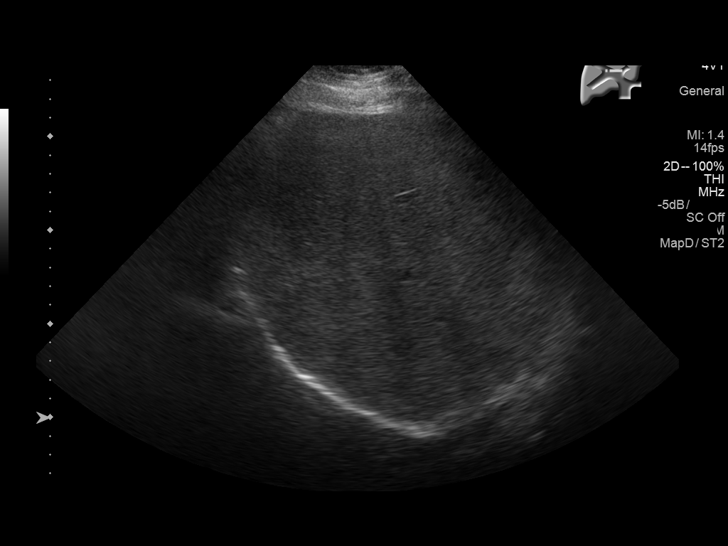
[im 59/71]
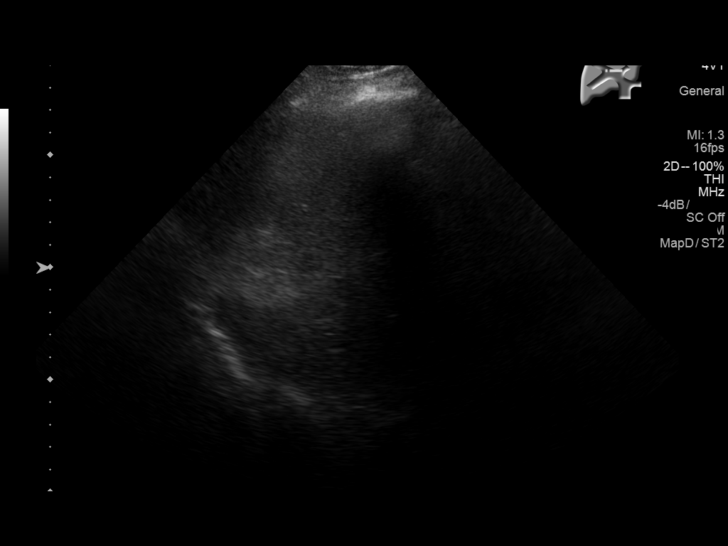
[im 65/71]
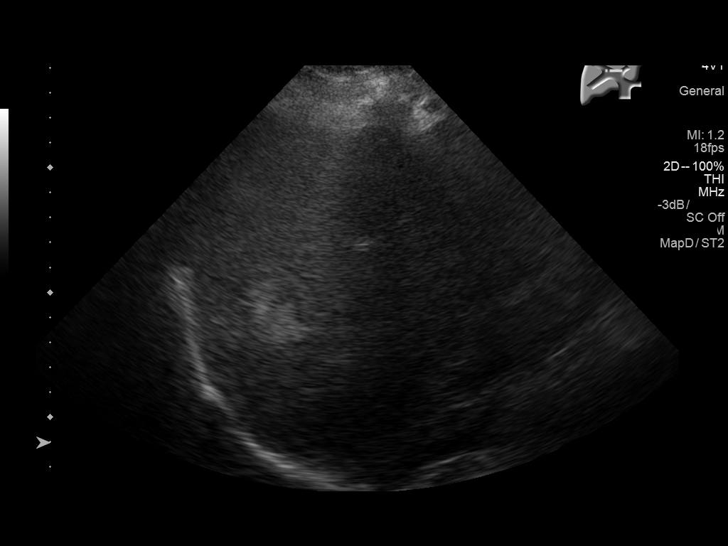
[im 71/71]
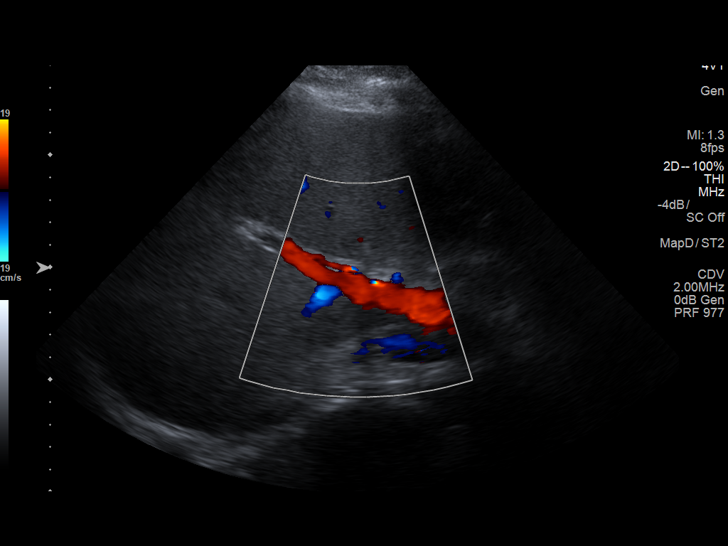

[14 of 25 positions shown; findings below may reference images not displayed]

FINDINGS: Gallbladder:

No gallstones or wall thickening visualized. No sonographic Murphy
sign noted by sonographer.

Common bile duct:

Diameter: 2.8 mm in diameter within normal limits.

Liver:

The liver shows normal echogenicity. Stable hemangiomas in right
hepatic lobe.
IMPRESSION: No gallstones are noted within gallbladder. Normal CBD. No
thickening of gallbladder wall.

## 2017-05-30 ENCOUNTER — Encounter: Payer: Self-pay | Admitting: Nurse Practitioner

## 2017-05-30 ENCOUNTER — Other Ambulatory Visit (HOSPITAL_BASED_OUTPATIENT_CLINIC_OR_DEPARTMENT_OTHER): Payer: Managed Care, Other (non HMO)

## 2017-05-30 ENCOUNTER — Ambulatory Visit (HOSPITAL_BASED_OUTPATIENT_CLINIC_OR_DEPARTMENT_OTHER): Payer: Managed Care, Other (non HMO) | Admitting: Nurse Practitioner

## 2017-05-30 ENCOUNTER — Telehealth: Payer: Self-pay | Admitting: Hematology

## 2017-05-30 VITALS — BP 144/71 | HR 76 | Temp 98.7°F | Resp 19 | Ht 70.0 in | Wt 325.7 lb

## 2017-05-30 DIAGNOSIS — R197 Diarrhea, unspecified: Secondary | ICD-10-CM

## 2017-05-30 DIAGNOSIS — I10 Essential (primary) hypertension: Secondary | ICD-10-CM

## 2017-05-30 DIAGNOSIS — N739 Female pelvic inflammatory disease, unspecified: Secondary | ICD-10-CM | POA: Diagnosis not present

## 2017-05-30 DIAGNOSIS — R109 Unspecified abdominal pain: Secondary | ICD-10-CM

## 2017-05-30 DIAGNOSIS — F329 Major depressive disorder, single episode, unspecified: Secondary | ICD-10-CM

## 2017-05-30 DIAGNOSIS — R630 Anorexia: Secondary | ICD-10-CM | POA: Diagnosis not present

## 2017-05-30 DIAGNOSIS — C20 Malignant neoplasm of rectum: Secondary | ICD-10-CM

## 2017-05-30 DIAGNOSIS — D500 Iron deficiency anemia secondary to blood loss (chronic): Secondary | ICD-10-CM

## 2017-05-30 LAB — CBC WITH DIFFERENTIAL/PLATELET
BASO%: 0.9 % (ref 0.0–2.0)
Basophils Absolute: 0.1 10*3/uL (ref 0.0–0.1)
EOS%: 2.6 % (ref 0.0–7.0)
Eosinophils Absolute: 0.2 10*3/uL (ref 0.0–0.5)
HCT: 38.2 % (ref 34.8–46.6)
HGB: 12.5 g/dL (ref 11.6–15.9)
LYMPH#: 1.1 10*3/uL (ref 0.9–3.3)
LYMPH%: 16.9 % (ref 14.0–49.7)
MCH: 28 pg (ref 25.1–34.0)
MCHC: 32.7 g/dL (ref 31.5–36.0)
MCV: 85.7 fL (ref 79.5–101.0)
MONO#: 0.4 10*3/uL (ref 0.1–0.9)
MONO%: 6.1 % (ref 0.0–14.0)
NEUT#: 4.9 10*3/uL (ref 1.5–6.5)
NEUT%: 73.5 % (ref 38.4–76.8)
Platelets: 253 10*3/uL (ref 145–400)
RBC: 4.46 10*6/uL (ref 3.70–5.45)
RDW: 14.2 % (ref 11.2–14.5)
WBC: 6.7 10*3/uL (ref 3.9–10.3)

## 2017-05-30 LAB — COMPREHENSIVE METABOLIC PANEL
ALT: 22 U/L (ref 0–55)
AST: 16 U/L (ref 5–34)
Albumin: 3.8 g/dL (ref 3.5–5.0)
Alkaline Phosphatase: 89 U/L (ref 40–150)
Anion Gap: 8 mEq/L (ref 3–11)
BUN: 17.7 mg/dL (ref 7.0–26.0)
CHLORIDE: 107 meq/L (ref 98–109)
CO2: 25 meq/L (ref 22–29)
CREATININE: 0.7 mg/dL (ref 0.6–1.1)
Calcium: 9.7 mg/dL (ref 8.4–10.4)
EGFR: >60 mL/min/{1.73_m2} (ref >60)
Glucose: 97 mg/dl (ref 70–140)
Potassium: 4.3 mEq/L (ref 3.5–5.1)
SODIUM: 140 meq/L (ref 136–145)
Total Bilirubin: 0.52 mg/dL (ref 0.20–1.20)
Total Protein: 7.2 g/dL (ref 6.4–8.3)

## 2017-05-30 LAB — CEA (IN HOUSE-CHCC): CEA (CHCC-In House): 2.27 ng/mL (ref 0.00–5.00)

## 2017-05-30 NOTE — Progress Notes (Addendum)
[place]  [phone] [phone]  Clinic Follow up Note   Patient Care Team: [name], MD as PCP - General (Family Medicine) Michael Boston, MD as Consulting Physician (General Surgery) Truitt Merle, MD as Consulting Physician (Medical Oncology) Kyung Rudd, MD as Consulting Physician (Radiation Oncology) Teena Irani, MD as Consulting Physician (Gastroenterology) Raynelle Bring, MD as Consulting Physician (Urology) 05/30/2017  SUMMARY OF ONCOLOGIC HISTORY: Oncology History   Rectal adenocarcinoma   Staging form: Colon and Rectum, AJCC 7th Edition     Clinical: T3, N2, M0 - Unsigned       Rectal adenocarcinoma s/p LAR resection 05/25/2015   01/13/2015 Initial Diagnosis    Rectal adenocarcinoma      01/13/2015 Procedure    Colonoscopy showed a sensitivity O nonobstructing mass in the rectum and from 12-18 cm proximal to the Annis. The mass was circumferential, measuring about 6 cm in length. EGD was negative.      01/17/2015 Tumor Marker    CEA 1.4, CA-19-9 8, MMR normal       01/18/2015 Procedure    Lower endoscopic ultrasound by Dr. Paulita Fujita showed a T3 N2 rectal mass.      01/20/2015 Imaging    CT abdomen and pelvis with contrast showed right lateral rectal wall exophytic mass, and 2 ill-defined hepatic hypoenhancing lesions which appears to correspond to the lesion seen on the prior MRI in 2015.      02/05/2015 Imaging    abdomen MRI showed 2 hemangioma, no suspicion for metastatic disease. CT chest was negative      02/20/2015 - 03/30/2015 Radiation Therapy    neoadjuvant RT to rectal cancer       02/20/2015 Concurrent Chemotherapy    capecitabine 2500 mg in the morning and 2000 mg in the evening (827m/m2, bid), on the day of radiation.       05/25/2015 Surgery    Recto-sigmoid segmental resection, margins are negative       05/25/2015 Pathology Results    0.2cm residual invasive adenocarcinoma, G2, negative margins, 12 nodes  were negative       06/23/2015 - 06/29/2015 Hospital Admission    Patient was admitted for pelvic abscess, drain placed and she was treated with IV antibiotics, she also received 1 dose of Feraheme for anemia.      09/15/2015 - 09/17/2015 Hospital Admission    Recurrent pelvic collection s/p perc drainage 09/16/2015      02/21/2016 Imaging    CT abdomen and pelvis w contrast IMPRESSION: 1. Interval removal of surgical drains since 12/19/2015 from the presacral space. Similar size of ill-defined presacral fluid and gas, for which residual abscess cannot be excluded. Similar amount of intraperitoneal edema throughout the upper pelvis with foci of extraluminal gas, possibly related to the presacral process. 2. Diverting right-sided ileostomy, without acute complication. 3. Similar moderate hydroureteronephrosis, likely due to the pelvic process. 4. Possible bladder wall thickening. Correlate with urinalysis. This appearance could be partially due to underdistention. 5. Geographic hepatic steatosis and hemangiomas. Cannot exclude a new right hepatic lobe lesion. Consider nonemergent pre and post contrast outpatient MRI. This could represent a new area of focal steatosis.      05/31/2016 Surgery    ileostomy takedown BY Dr. GJohney Maine      07/04/2016 Imaging    CT Abdomen Pelvis W Contrast 07/04/16 IMPRESSION: Subcutaneous fluid collection at the site of ileostomy takedown, could be sterile or infected, measuring 9.3 x 4.3 x 5.3 cm; this could  be aspirated under ultrasound guidance if clinically indicated. Large collection of stool within the presacral space, could potentially be within a distended distorted rectum but since this occupies the same position as the abscess collection identified on the previous CT this is suspicious for a contiguous contained extraluminal stool collection communicating with the rectosigmoid colon, collection overall roughly 7 x 8 x 9 cm.      07/12/2016 Surgery      Segmental resection of the proximal and distal colon for a colovaginal fistula revealed no malignancy.      12/26/2016 Imaging    IMPRESSION: 1. Stable CT exam of the chest.  No evidence for metastatic disease. 2. Interval left colectomy with left abdominal end colostomy. Presacral soft tissue is similar to prior study when taking into account the interval surgery. 3. Hepatic dome lesion seen on the previous study not evident today, potentially related to bolus timing. The 2.4 cm subcapsular inferior right liver lesion remains subtle and is barely visible on today's exam but shows no substantial interval change in size. Continued attention on follow-up recommended.      CURRENT THERAPY: Surveillance  INTERVAL HISTORY: Ms. Ringel returns for colon cancer surveillance as scheduled. Last seen 5 months ago, has been doing well in the interim.  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 Intermittent abdominal pain related to multiple abd surgeries stable overall, well controlled with PRN tramadol and norco. End colostomy functioning well with normal stool output, eat fiber in her diet; no nausea or vomiting. Intermittent bladder spasms, completed pelvic PT with improvement in bladder leakage. Does not f/u with surgery or GI. Persistent neuropathy in feet > hands is stable, does not impair function; on neurontin per PCP.   REVIEW OF SYSTEMS:   Constitutional: Denies fevers, chills or abnormal weight loss. (+) weight gain (+) good appetite (+) fatigue, related to working 3-4 12 hr shifts per week  Eyes: Denies blurriness of vision Ears, nose, mouth, throat, and face: Denies mucositis or sore throat Respiratory: Denies cough, dyspnea or wheezes Cardiovascular: Denies palpitation, chest  discomfort or lower extremity swelling Gastrointestinal:  Denies nausea, heartburn or change in bowel habits (+) mucus discharge per rectum (+) intermittent ABD pain, well controlled with tramadol, norco (+) end colostomy  GU: (+) vaginal leakage (+) bladder spasm s/p pelvic PT with improvement Skin: Denies abnormal skin rashes Lymphatics: Denies new lymphadenopathy or easy bruising Neurological:Denies  new weaknesses (+) numbness to toes, stable (+) tingling to left hand 4th and 5th fingers, stable not impairing function Behavioral/Psych: Mood is stable, no new changes  All other systems were reviewed with the patient and are negative.  MEDICAL HISTORY:  Past Medical History:  Diagnosis Date  . Anxiety   . Chemotherapy-induced neuropathy (HCC)    toes and fingers numbness and tingling  . Colostomy in place [facility]) 07/12/2016   due to anastomosis breakdown w/ colovaginal fistula  . Complication of anesthesia   . Depression   . Family history of adverse reaction to anesthesia    mother-- ponv  . Gastroenteritis 12/20/2015  . GERD (gastroesophageal reflux disease)   . Hiatal hernia   . History of cancer chemotherapy 02-20-2015 to 03-30-2015  . History of cardiac murmur as a child   . History of chemotherapy   . History of chronic gastritis   .  History of hypertension    no issues since multiple abdminal sx's and chemo--- no medication since 12/ 2016  . History of TMJ disorder   . Hypercholesteremia   . IBS (irritable bowel syndrome)    dx age 52  . Intermittent abdominal pain    post-op multiple abdominal sx's   . Microcytic anemia   . Mild sleep apnea    per pt study 2014  very mild osa , no cpap recommended, recommeded wt loss and sleep routine  . OA (osteoarthritis)    left knee /  left shoulder  . Obesity   . PONV (postoperative nausea and vomiting)    severe" needs Scopolamine PATCH"   . Portacath in place    right chest  . Rectal adenocarcinoma ([place]) oncologis-  dr  Burr Medico-- after radiation/ chemo (ypT1, N0) --  no recurrence per last note 03/ 2018   dx 01-13-2015-- Stage IIIC (T3, N2, M0) post concurrent radiation and chemotherapy 02-20-2015 to 03-30-2015 /  05-25-2015 s/p  LAR w/ RSO (post-op complicated by late abscess and contained anatomotic leak w/ help percutaneous drainage and antibiotics)   . Rotator cuff tear, left   . S/P radiation therapy 02/20/15-03/30/15   colon/rectal  . Vitamin D deficiency   . Wears glasses   . Wears glasses     SURGICAL HISTORY: Past Surgical History:  Procedure Laterality Date  . ABDOMINAL HYSTERECTOMY  1996   uterus and cervix  . COMBINED HYSTEROSCOPY DIAGNOSTIC / D&C  x2 1990's  . DIAGNOSTIC LAPAROSCOPY  age 67 and age 76  . DISIMPACTION REMOVAL  11/17/2015   Performed by Michael Boston, MD at [facility] ORS  . EXAM UNDER ANESTHESIA  11/17/2015   Performed by Michael Boston, MD at [facility] ORS  . EXCISION OF SKIN TAG  11/17/2015   Performed by Michael Boston, MD at [facility] ORS  . INSERTION PORT-A-CATH N/A 05/25/2015   Performed by Michael Boston, MD at [facility] ORS  . KNEE ARTHROSCOPY Left 1990's  . KNEE ARTHROSCOPY W/ MENISCECTOMY Left 09/14/2009   and chondroplasty debridement  . LAPAROSCOPIC DIVERTING LOOP ILEOSTOMY  DRAINAGE OF PELVIC ABSCESS N/A 11/17/2015   Performed by Michael Boston, MD at [facility] ORS  . LAPAROSCOPIC LYSIS OF ADHESIONS  11/17/2015   Performed by Michael Boston, MD at [facility] ORS  . LAPAROSCOPIC LYSIS OF ADHESIONS, OMENTOPEXY, HAND ASSISTED RESECTION OF  COLON, END TO END ANASTOMOSIS, COLOSTOMY N/A 07/12/2016   Performed by Michael Boston, MD at [facility] ORS  . LOWER ENDOSCOPIC ULTRASOUND (EUS) N/A 01/18/2015   Performed by Arta Silence, MD at [facility]  . REMOVAL PORT-A-CATH N/A 11/21/2016   Performed by Michael Boston, MD at [facility]  . ROTATOR CUFF REPAIR Right 2006  . TAKEDOWN LOOP ILEOSTOMY N/A 05/31/2016   Performed by Michael Boston, MD at [facility] ORS  . TONSILLECTOMY  age 60  . XI ROBOTIC ASSISTED LOWER ANTERIOR  RESECTION, , RIGID PROCTOSCOPY, RIGHT OOPHORECTOMY N/A 05/25/2015   Performed by Michael Boston, MD at [facility] ORS    I have reviewed the social history and family history with the patient and they are unchanged from previous note.  ALLERGIES:  is allergic to caine-1 [lidocaine]; flagyl [metronidazole]; adhesive [tape]; iron; oxycodone; penicillins; and sulfa antibiotics.  MEDICATIONS:  Current Outpatient Medications  Medication Sig Dispense Refill  . Cholecalciferol (VITAMIN D3) 5000 units CAPS Take 1 capsule by mouth daily.    . Coenzyme Q10 (COQ-10) 100 MG CAPS Take 100 mg by mouth every evening.     Marland Kitchen  DULoxetine (CYMBALTA) 60 MG capsule Take 60 mg by mouth at bedtime.     Marland Kitchen esomeprazole (NEXIUM) 40 MG capsule Take 1 capsule (40 mg total) by mouth 2 (two) times daily before a meal. 60 capsule 5  . gabapentin (NEURONTIN) 100 MG capsule Take 200 mg by mouth 2 (two) times daily.  0  . HYDROcodone-acetaminophen (NORCO/VICODIN) 5-325 MG tablet Take 1 tablet by mouth every 6 (six) hours as needed for moderate pain. 30 tablet 0  . hyoscyamine (LEVBID) 0.375 MG 12 hr tablet Take 0.375 mg by mouth 2 (two) times daily.     . Melatonin 5 MG CAPS Take 5 mg by mouth at bedtime.     . meloxicam (MOBIC) 15 MG tablet Take 15 mg by mouth every evening.     . methocarbamol (ROBAXIN) 500 MG tablet Take 2 tablets (1,000 mg total) by mouth every 6 (six) hours as needed for muscle spasms. (Patient taking differently: Take 500-1,000 mg by mouth every 8 (eight) hours as needed for muscle spasms. ) 20 tablet 3  . traMADol (ULTRAM) 50 MG tablet Take 50 mg by mouth every 6 (six) hours as needed.    . diphenoxylate-atropine (LOMOTIL) 2.5-0.025 MG tablet TAKE ONE TABLET BY MOUTH FOUR TIMES DAILY AS NEEDED FOR DIARRHEA (Patient not taking: Reported on 05/30/2017) 30 tablet 1  . lidocaine-prilocaine (EMLA) cream Apply to affected area once before port access (Patient not taking: Reported on 05/30/2017) 30 g 3  . ondansetron  (ZOFRAN) 8 MG tablet Take 1 tablet (8 mg total) by mouth 2 (two) times daily as needed for nausea or vomiting. Start the day after chemo for 2 days. Then take as needed for nausea or vomiting. (Patient not taking: Reported on 05/30/2017) 30 tablet 2  . promethazine (PHENERGAN) 25 MG tablet Take 25 mg by mouth every 8 (eight) hours as needed for nausea or vomiting.      No current facility-administered medications for this visit.    Facility-Administered Medications Ordered in Other Visits  Medication Dose Route Frequency Provider Last Rate Last Dose  . 0.9 %  sodium chloride infusion   Intravenous Once Truitt Merle, MD        PHYSICAL EXAMINATION: ECOG PERFORMANCE STATUS: 1 - Symptomatic but completely ambulatory  Vitals:   05/30/17 1040  BP: (!) 144/71  Pulse: 76  Resp: 19  Temp: 98.7 F (37.1 C)  SpO2: 99%   Filed Weights   05/30/17 1040  Weight: (!) 325 lb 11.2 oz (147.7 kg)    GENERAL:alert, no distress and comfortable SKIN: skin color, texture, turgor are normal, no rashes or significant lesions EYES: normal, Conjunctiva are pink and non-injected, sclera clear OROPHARYNX:no exudate, no erythema and lips, buccal mucosa, and tongue normal  NECK: supple, thyroid normal size, non-tender, without nodularity LYMPH:  no palpable cervical, supraclavicular, axillary, inguinal lymphadenopathy  LUNGS: clear to auscultation bilaterally with normal breathing effort HEART: regular rate & rhythm and no murmurs and no lower extremity edema ABDOMEN:abdomen soft, non-tender and normal bowel sounds (+) Left colostomy (+) multiple abdominal incisions well-healed Musculoskeletal:no cyanosis of digits and no clubbing  NEURO: alert & oriented x 3 with fluent speech, no focal motor deficits. (+) Mildly decreased vibratory sense to feet bilaterally per tuning fork exam  LABORATORY DATA:  I have reviewed the data as listed CBC Latest Ref Rng & Units 05/30/2017 12/25/2016 11/21/2016  WBC 3.9 - 10.3  10e3/uL 6.7 6.7 -  Hemoglobin 11.6 - 15.9 g/dL 12.5 12.2 12.7  Hematocrit  34.8 - 46.6 % 38.2 38.6 -  Platelets 145 - 400 10e3/uL 253 239 -    CMP Latest Ref Rng & Units 05/30/2017 12/25/2016 09/24/2016  Glucose 70 - 140 mg/dl 97 88 99  BUN 7.0 - 26.0 mg/dL 17.7 13.3 14.3  Creatinine 0.6 - 1.1 mg/dL 0.7 0.7 0.8  Sodium 136 - 145 mEq/L 140 143 142  Potassium 3.5 - 5.1 mEq/L 4.3 4.2 4.0  Chloride 101 - 111 mmol/L - - -  CO2 22 - 29 mEq/L 25 24 25   Calcium 8.4 - 10.4 mg/dL 9.7 9.7 10.1  Total Protein 6.4 - 8.3 g/dL 7.2 7.1 7.6  Total Bilirubin 0.20 - 1.20 mg/dL 0.52 0.36 0.37  Alkaline Phos 40 - 150 U/L 89 101 86  AST 5 - 34 U/L 16 15 13   ALT 0 - 55 U/L 22 18 14    CEA 05/30/2017 2.27  RADIOGRAPHIC STUDIES: I have personally reviewed the radiological images as listed and agreed with the findings in the report. No results found.   CT CAP 12/26/2016 iMPRESSION: 1. Stable CT exam of the chest.  No evidence for metastatic disease. 2. Interval left colectomy with left abdominal end colostomy. Presacral soft tissue is similar to prior study when taking into account the interval surgery. 3. Hepatic dome lesion seen on the previous study not evident today, potentially related to bolus timing. The 2.4 cm subcapsular inferior right liver lesion remains subtle and is barely visible on today's exam but shows no substantial interval change in size. Continued attention on follow-up recommended.  ASSESSMENT & PLAN: Ms. Onofre is a 51 y.o. female with chronic abdominal pain with rectal mass on colonoscopy found to be invasive rectal adenocarcinoma.   1. T3N2M0, Stage IIIC rectal adenocarcinoma, ypT1N0 after neoadjuvant chemotherapy and radiation 2. Intermittent abdominal pain and diarrhea  3. Microcytic anemia, secondary to GI blood loss and iron deficiency anemia 4. HTN 5. Anorexia and depression 6. Fatigue 7.  Rectal, vaginal discharge  Ms. Charlet appears stable today, no acute changes  in her baseline.  Continues to have constant vaginal and rectal discharge after abdominopelvic surgery in 2017. Pelvic PT has helped with this some. She has not f/u with surgery or GI recently. Last colonoscopy was >1 year ago prior to lap colorectal anastomosis resection/end colostomy/omentopexy of rectal stump and vaginal cuff procedure; she reports being on 3 year follow up per Dr. Amedeo Plenty. She is considering changing GI to [facility] since she works in [employer]; I recommend she f/u with Dr. Amedeo Plenty prior.  Surveillance CT CAP on 12/26/2016 was negative for metastatic disease.  BP elevated today, not on antihypertensive medication, will continue to monitor.  Weight is increased today, I recommended her to eat healthy and get regular exercise.  CBC, Cmet, CEA all normal today, anemia is resolved, physical exam unremarkable, she is doing well overall; no clinical concern for recurrence.  We will obtain surveillance CT CAP in 6-7 months, ordered today, with follow-up a few days after.  PLAN CT CAP in May/June 2019, lab and MD follow-up a few days after Follow-up with GI and PCP as instructed Copy my note to Dr. Amedeo Plenty, GI Eat healthy diet, regular physical exercise  All questions were answered. The patient knows to call the clinic with any problems, questions or concerns. No barriers to learning was detected.    Alla Feeling, NP 05/30/17

## 2017-05-30 NOTE — Telephone Encounter (Signed)
Gave avs and calendar for June 2019

## 2017-06-04 ENCOUNTER — Telehealth: Payer: Self-pay | Admitting: Emergency Medicine

## 2017-06-04 NOTE — Telephone Encounter (Signed)
Left VM with pt. CEA results normal per Cira Rue NP.

## 2017-06-13 ENCOUNTER — Other Ambulatory Visit: Payer: Self-pay | Admitting: Surgery

## 2017-06-13 DIAGNOSIS — C20 Malignant neoplasm of rectum: Secondary | ICD-10-CM

## 2017-06-16 ENCOUNTER — Ambulatory Visit
Admission: RE | Admit: 2017-06-16 | Discharge: 2017-06-16 | Disposition: A | Discharge status: Home / Self Care | Payer: Managed Care, Other (non HMO) | Source: Ambulatory Visit | Attending: Surgery | Admitting: Surgery

## 2017-06-16 DIAGNOSIS — C20 Malignant neoplasm of rectum: Secondary | ICD-10-CM

## 2017-06-16 MED ORDER — IOPAMIDOL (ISOVUE-300) INJECTION 61%
125.0000 mL | Freq: Once | INTRAVENOUS | Status: AC | PRN
  Administered 2017-06-16: 125 mL via INTRAVENOUS

## 2017-09-03 ENCOUNTER — Encounter: Payer: Self-pay | Admitting: Hematology

## 2017-10-24 ENCOUNTER — Other Ambulatory Visit: Payer: Self-pay | Admitting: Hematology

## 2017-10-24 DIAGNOSIS — C20 Malignant neoplasm of rectum: Secondary | ICD-10-CM

## 2017-10-24 NOTE — Progress Notes (Unsigned)
ambul;a

## 2017-10-27 ENCOUNTER — Telehealth: Payer: Self-pay | Admitting: Hematology

## 2017-10-27 NOTE — Telephone Encounter (Signed)
Appointments scheduled letter/calendar mailed to patient per 4/12 sch msg

## 2017-11-12 ENCOUNTER — Other Ambulatory Visit: Payer: Self-pay | Admitting: *Deleted

## 2017-11-12 ENCOUNTER — Encounter: Payer: Self-pay | Admitting: Hematology

## 2017-11-18 ENCOUNTER — Telehealth: Payer: Self-pay | Admitting: *Deleted

## 2017-11-18 NOTE — Telephone Encounter (Signed)
Call received from [name] in reference to "radiology exams before next F/U.  New orders are needed with same location."  Call transferred to collaborative to help with this request. matter.

## 2017-11-19 ENCOUNTER — Telehealth: Payer: Self-pay

## 2017-11-19 NOTE — Telephone Encounter (Signed)
Called and spoke with Manuela Schwartz at [facility] about scheduling the CT of Chest and CT of Abd and Pelvis at [facility].  She will call patient right away and work with her to get these scheduled.

## 2017-12-04 ENCOUNTER — Other Ambulatory Visit: Payer: Managed Care, Other (non HMO)

## 2017-12-04 ENCOUNTER — Encounter: Payer: Managed Care, Other (non HMO) | Admitting: Genetics

## 2017-12-09 ENCOUNTER — Ambulatory Visit (HOSPITAL_COMMUNITY)
Admission: RE | Admit: 2017-12-09 | Discharge: 2017-12-09 | Disposition: A | Discharge status: Home / Self Care | Payer: Managed Care, Other (non HMO) | Source: Ambulatory Visit | Attending: Nurse Practitioner | Admitting: Nurse Practitioner

## 2017-12-09 DIAGNOSIS — C20 Malignant neoplasm of rectum: Secondary | ICD-10-CM | POA: Diagnosis present

## 2017-12-09 DIAGNOSIS — K435 Parastomal hernia without obstruction or  gangrene: Secondary | ICD-10-CM | POA: Insufficient documentation

## 2017-12-09 DIAGNOSIS — I281 Aneurysm of pulmonary artery: Secondary | ICD-10-CM | POA: Diagnosis not present

## 2017-12-09 DIAGNOSIS — I700 Atherosclerosis of aorta: Secondary | ICD-10-CM | POA: Insufficient documentation

## 2017-12-09 MED ORDER — IOPAMIDOL (ISOVUE-300) INJECTION 61%
100.0000 mL | Freq: Once | INTRAVENOUS | Status: AC | PRN
  Administered 2017-12-09: 100 mL via INTRAVENOUS

## 2017-12-09 MED ORDER — IOPAMIDOL (ISOVUE-300) INJECTION 61%
INTRAVENOUS | Status: AC
  Filled 2017-12-09: qty 30

## 2017-12-09 MED ORDER — IOPAMIDOL (ISOVUE-300) INJECTION 61%
INTRAVENOUS | Status: AC
  Filled 2017-12-09: qty 100

## 2017-12-09 MED ORDER — IOPAMIDOL (ISOVUE-300) INJECTION 61%
30.0000 mL | Freq: Once | INTRAVENOUS | Status: AC | PRN
  Administered 2017-12-09: 30 mL via ORAL

## 2017-12-12 NOTE — Progress Notes (Signed)
[place]  [phone] [phone]  Clinic Follow Up Note   Patient Care Team: [name], MD as PCP - General (Family Medicine) Michael Boston, MD as Consulting Physician (General Surgery) Truitt Merle, MD as Consulting Physician (Medical Oncology) Kyung Rudd, MD as Consulting Physician (Radiation Oncology) Teena Irani, MD (Inactive) as Consulting Physician (Gastroenterology) Raynelle Bring, MD as Consulting Physician (Urology)   Date of Service:  12/15/2017  CHIEF COMPLAINTS:  Follow up rectal cancer   Oncology History   Rectal adenocarcinoma   Staging form: Colon and Rectum, AJCC 7th Edition     Clinical: T3, N2, M0 - Unsigned       Rectal adenocarcinoma s/p LAR resection 05/25/2015   01/13/2015 Initial Diagnosis    Rectal adenocarcinoma      01/13/2015 Procedure    Colonoscopy showed a sensitivity O nonobstructing mass in the rectum and from 12-18 cm proximal to the Annis. The mass was circumferential, measuring about 6 cm in length. EGD was negative.      01/17/2015 Tumor Marker    CEA 1.4, CA-19-9 8, MMR normal       01/18/2015 Procedure    Lower endoscopic ultrasound by Dr. Paulita Fujita showed a T3 N2 rectal mass.      01/20/2015 Imaging    CT abdomen and pelvis with contrast showed right lateral rectal wall exophytic mass, and 2 ill-defined hepatic hypoenhancing lesions which appears to correspond to the lesion seen on the prior MRI in 2015.      02/05/2015 Imaging    abdomen MRI showed 2 hemangioma, no suspicion for metastatic disease. CT chest was negative      02/20/2015 - 03/30/2015 Radiation Therapy    neoadjuvant RT to rectal cancer       02/20/2015 Concurrent Chemotherapy    capecitabine 2500 mg in the morning and 2000 mg in the evening (85m/m2, bid), on the day of radiation.       05/25/2015 Surgery    Recto-sigmoid segmental resection, margins are negative       05/25/2015 Pathology Results    0.2cm residual invasive  adenocarcinoma, G2, negative margins, 12 nodes were negative       06/23/2015 - 06/29/2015 Hospital Admission    Patient was admitted for pelvic abscess, drain placed and she was treated with IV antibiotics, she also received 1 dose of Feraheme for anemia.      09/15/2015 - 09/17/2015 Hospital Admission    Recurrent pelvic collection s/p perc drainage 09/16/2015      02/21/2016 Imaging    CT abdomen and pelvis w contrast IMPRESSION: 1. Interval removal of surgical drains since 12/19/2015 from the presacral space. Similar size of ill-defined presacral fluid and gas, for which residual abscess cannot be excluded. Similar amount of intraperitoneal edema throughout the upper pelvis with foci of extraluminal gas, possibly related to the presacral process. 2. Diverting right-sided ileostomy, without acute complication. 3. Similar moderate hydroureteronephrosis, likely due to the pelvic process. 4. Possible bladder wall thickening. Correlate with urinalysis. This appearance could be partially due to underdistention. 5. Geographic hepatic steatosis and hemangiomas. Cannot exclude a new right hepatic lobe lesion. Consider nonemergent pre and post contrast outpatient MRI. This could represent a new area of focal steatosis.      05/31/2016 Surgery    ileostomy takedown BY Dr. GJohney Maine      07/04/2016 Imaging    CT Abdomen Pelvis W Contrast 07/04/16 IMPRESSION: Subcutaneous fluid collection at the site of ileostomy takedown, could be  sterile or infected, measuring 9.3 x 4.3 x 5.3 cm; this could be aspirated under ultrasound guidance if clinically indicated. Large collection of stool within the presacral space, could potentially be within a distended distorted rectum but since this occupies the same position as the abscess collection identified on the previous CT this is suspicious for a contiguous contained extraluminal stool collection communicating with the rectosigmoid colon, collection overall  roughly 7 x 8 x 9 cm.      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      12/26/2016 Imaging    IMPRESSION: 1. Stable CT exam of the chest.  No evidence for metastatic disease. 2. Interval left colectomy with left abdominal end colostomy. Presacral soft tissue is similar to prior study when taking into account the interval surgery. 3. Hepatic dome lesion seen on the previous study not evident today, potentially related to bolus timing. The 2.4 cm subcapsular inferior right liver lesion remains subtle and is barely visible on today's exam but shows no substantial interval change in size. Continued attention on follow-up recommended.       12/09/2017 Imaging    CT CAP W Contrast 12/09/17  IMPRESSION: 1. Stable post treatment changes in the presacral space with no evidence of recurrent disease in the pelvis. 2. No evidence of metastatic disease in the chest, abdomen or pelvis. 3. Large parastomal hernia has increased in size and contains much of the remnant left colon. No evidence of bowel obstruction or ischemia. 4. Stable dilated main pulmonary artery, suggesting chronic pulmonary arterial hypertension. 5.  Aortic Atherosclerosis (ICD10-I70.0).       HISTORY OF PRESENTING ILLNESS:  Lindsay Little 52 y.o. female is here because of evaluation for management for newly diagnosed rectal adenocarcinoma.   On 09/06/13, she presented to Dr. Starr Sinclair [facility]] with 57-monthhistory of diarrhea and worsening abdominal pain. She has a history of chronic abdominal pain and had been previously [place] Dr. HAmedeo Plenty Abdominal [place]3/6/15 noted to have some hypoechoicity in the left hepatic lobe which was confirmed on MRI to be two hemangiomas  in the right hepatic lobe of measuring up to 3.0 cm along with an additional 7  mm probable cyst.   She presented with worsening of diarrhea for 6 month and was reevaluated by Dr. HAmedeo Plenty She was seen on 12/06/14 at which time it was decided she would undergo HIDA scan given that the pain was intermittently in the RUQ in addition to upper and lower endoscopy. HIDA scan 12/15/14 was reassuring, but she was found to have a malignant tumor of the rectum about 12-18 cm proximal to the anus, gastritis, gastric polyps on endoscopies 01/13/15. Biopsies for these three specimens along with the duodenum were collected and resulted on 01/17/15 for invasive adenocarcinoma, chronic gastritis [H. Pylori negative], fundic gland polyps, and non-specific mild patchy intra-epithelial lymphocytes. Endoscopic [place]orea7/6/16 staged the mass at T3N2Mx and she is here today for consideration of neoadjuvant therapy.    Today, she does report abdominal pain but is no different than her baseline [right-sided, upper, lower] but has had worsening appetite over the last 2 months. She does have diarrhea with bowel movements every 1-2 hours, especially after eating, and has been noticing bleeding with her bowel movement following her endoscopic evaluation. She also feels her energy level has decreased.  Otherwise, she denies nausea, chest pain, shortness of breath.    Family history is notable for a maternal uncle who had pancreatic cancer diagnosed in his mid to late 33s that spread to the colon and prostate before he died about 78 years later and a sister with non-melanoma skin cancer. 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 She smoked roughly 20 pack years [1 pack/week x 7 years] in the past and reports occasional alcohol use but denies any prior illicit drug use.  She lost about 7 lbs in the past 2 months  CURRENT THERAPY: Surveillance  INTERIM HISTORY  [name] returns for follow-up for rectal cancer. She was last seen by me almost 1 year ago. In interim she was  seen by NP Lacie 6 months ago. She presents to the clinic today accompanied by her husband.  She notes she had a back MRI due to her muscle pain in her legs and worsening neurotphy in her feet. Nothing was found. She notes to having vaginal drainage post hysterectomy with blood about twice a month. She has been seen by Gyn. She was put on clindamycin and diflucan rounds. She notes she has taken off from work due to her pain. She is not taking any magensium or calcium suppements. She plans to see Dr. Dema Severin again today. She will see a neurologist. She has incersaed 176m cymbatla. She is on meloxicam and vicodine for pain. She notes occasional SOB.     HISTORY:  Past Medical History:  Diagnosis Date  . Anxiety   . Chemotherapy-induced neuropathy (HCC)    toes and fingers numbness and tingling  . Colostomy in place ([facility] 07/12/2016   due to anastomosis breakdown w/ colovaginal fistula  . Complication of anesthesia   . Depression   . Family history of adverse reaction to anesthesia    mother-- ponv  . Gastroenteritis 12/20/2015  . GERD (gastroesophageal reflux disease)   . Hiatal hernia   . History of cancer chemotherapy 02-20-2015 to 03-30-2015  . History of cardiac murmur as a child   . History of chemotherapy   . History of chronic gastritis   . History of hypertension    no issues since multiple abdminal sx's and chemo--- no medication since 12/ 2016  . History of TMJ disorder   . Hypercholesteremia   . IBS (irritable bowel syndrome)    dx age 52 . Intermittent abdominal pain    post-op multiple abdominal sx's   . Microcytic anemia   . Mild sleep apnea    per pt study 2014  very mild osa , no cpap recommended, recommeded wt loss and sleep routine  . OA (osteoarthritis)    left knee /  left shoulder  . Obesity   . PONV (postoperative nausea and vomiting)    severe" needs Scopolamine PATCH"   . Portacath in place    right chest  . [place] [facility] [place] [facility]  fBurr Medico- after radiation/ chemo (ypT1, N0) --  no recurrence per last note 03/ 2018   dx 01-13-2015-- Stage IIIC (T3, N2, M0) post concurrent radiation and chemotherapy 02-20-2015 to 03-30-2015 /  05-25-2015 s/p  LAR w/ RSO (post-op complicated by late abscess and contained anatomotic leak w/ help percutaneous drainage and antibiotics)   . Rotator cuff tear, left   . S/P radiation therapy 02/20/15-03/30/15   colon/rectal  . Vitamin D deficiency   . Wears glasses   .  Wears glasses     SURGICAL HISTORY: Past Surgical History:  Procedure Laterality Date  . ABDOMINAL HYSTERECTOMY  1996   uterus and cervix  . COLON RESECTION N/A 07/12/2016   Procedure: LAPAROSCOPIC LYSIS OF ADHESIONS, OMENTOPEXY, HAND ASSISTED RESECTION OF  COLON, END TO END ANASTOMOSIS, COLOSTOMY;  Surgeon: Michael Boston, MD;  Location: [facility];  Service: General;  Laterality: N/A;  . COMBINED HYSTEROSCOPY DIAGNOSTIC / D&C  x2 1990's  . DIAGNOSTIC LAPAROSCOPY  age 31 and age 77  . EUS N/A 01/18/2015   Procedure: LOWER ENDOSCOPIC ULTRASOUND (EUS);  Surgeon: Arta Silence, MD;  Location: [facility];  Service: Endoscopy;  Laterality: N/A;  . EXCISION OF SKIN TAG  11/17/2015   Procedure: EXCISION OF SKIN TAG;  Surgeon: Michael Boston, MD;  Location: [facility];  Service: General;;  . ILEO LOOP COLOSTOMY CLOSURE N/A 11/17/2015   Procedure: LAPAROSCOPIC DIVERTING LOOP ILEOSTOMY  DRAINAGE OF PELVIC ABSCESS;  Surgeon: Michael Boston, MD;  Location: [facility];  Service: General;  Laterality: N/A;  . ILEOSTOMY CLOSURE N/A 05/31/2016   Procedure: TAKEDOWN LOOP ILEOSTOMY;  Surgeon: Michael Boston, MD;  Location: [facility];  Service: General;  Laterality: N/A;  . IMPACTION REMOVAL  11/17/2015   Procedure: DISIMPACTION REMOVAL;  Surgeon: Michael Boston, MD;  Location: [facility];  Service: General;;  . KNEE ARTHROSCOPY Left 1990's  . KNEE ARTHROSCOPY W/ MENISCECTOMY Left 09/14/2009   and chondroplasty debridement  . LAPAROSCOPIC LYSIS OF ADHESIONS  11/17/2015    Procedure: LAPAROSCOPIC LYSIS OF ADHESIONS;  Surgeon: Michael Boston, MD;  Location: [facility];  Service: General;;  . PORT-A-CATH REMOVAL N/A 11/21/2016   Procedure: REMOVAL PORT-A-CATH;  Surgeon: Michael Boston, MD;  Location: [facility];  Service: General;  Laterality: N/A;  . PORTACATH PLACEMENT N/A 05/25/2015   Procedure: INSERTION PORT-A-CATH;  Surgeon: Michael Boston, MD;  Location: [facility];  Service: General;  Laterality: N/A;-remains inplace Right chest.  . ROTATOR CUFF REPAIR Right 2006  . TONSILLECTOMY  age 71  . XI ROBOTIC ASSISTED LOWER ANTERIOR RESECTION N/A 05/25/2015   Procedure: XI ROBOTIC ASSISTED LOWER ANTERIOR RESECTION, , RIGID PROCTOSCOPY, RIGHT OOPHORECTOMY;  Surgeon: Michael Boston, MD;  Location: [facility];  Service: General;  Laterality: N/A;    SOCIAL HISTORY: Social History   Socioeconomic History  . Marital status: Married    Spouse name: Not on file  . Number of children: Not on file  . Years of education: Not on file  . Highest education level: Not on file  Occupational History  . Not on file  Social Needs  . Financial resource strain: Not on file  . Food insecurity:    Worry: Not on file    Inability: Not on file  . Transportation needs:    Medical: Not on file    Non-medical: Not on file  Tobacco Use  . Smoking status: Former Smoker    Packs/day: 0.25    Years: 7.00    Pack years: 1.75    Types: Cigarettes    Last attempt to quit: 07/16/1991    Years since quitting: 26.4  . Smokeless tobacco: Never Used  Substance and Sexual Activity  . Alcohol use: No  . Drug use: No  . Sexual activity: Yes    Birth control/protection: Surgical  Lifestyle  . Physical activity:    Days per week: Not on file    Minutes per session: Not on file  . Stress: Not on file  Relationships  . Social connections:  Talks on phone: Not on file    Gets together: Not on file    Attends religious service: Not on file    Active member of club or organization: Not  on file    Attends meetings of clubs or organizations: Not on file    Relationship status: Not on file  . Intimate partner violence:    Fear of current or ex partner: Not on file    Emotionally abused: Not on file    Physically abused: Not on file    Forced sexual activity: Not on file  Other Topics Concern  . Not on file  Social History Narrative   Tobacco Use: Cigarettes - Former Smoker   Alcohol: Yes, very rare, liquor.    No recreational drug use   Occupation: Head CMA @ [employer]   Marital Status: Married    Husband: Roselyn Reef Disabled   Children: 2 adopted kids [place]   Religion: [employer] [place] HISTORY: Family History  Problem Relation Age of Onset  . Coronary artery disease Mother 62  . Hypertension Mother   . Hyperlipidemia Mother   . Diabetes Mellitus I Mother   . Coronary artery disease Father   . Hyperlipidemia Father   . Hypertension Father   . Cancer Sister        skin - non melanoma  . Hyperlipidemia Brother   . Cancer Maternal Uncle 80       pancreatic with mets to colon and prostate  . Cirrhosis Maternal Uncle   . Hypertension Maternal Grandmother   . Diabetes Mellitus I Maternal Grandmother   . Hyperlipidemia Maternal Grandmother   . CVA Maternal Grandmother   . Hypertension Maternal Grandfather   . Coronary artery disease Maternal Grandfather 65  . Hyperlipidemia Maternal Grandfather   . Coronary artery disease Paternal Grandmother   . Hypertension Paternal Grandmother   . Hyperlipidemia Paternal Grandmother   . Diabetes Mellitus I Paternal Grandmother   . Hypertension Paternal Grandfather   . Hyperlipidemia Paternal Grandfather   . Coronary artery disease Paternal Grandfather     ALLERGIES:  is allergic to caine-1 [lidocaine]; flagyl [metronidazole]; adhesive [tape]; iron; oxycodone; penicillins; and sulfa antibiotics.  MEDICATIONS:  Current Outpatient Medications  Medication Sig Dispense  Refill  . Cholecalciferol (VITAMIN D3) 5000 units CAPS Take 1 capsule by mouth daily.    . Coenzyme Q10 (COQ-10) 100 MG CAPS Take 100 mg by mouth every evening.     . DULoxetine (CYMBALTA) 60 MG capsule Take 120 mg by mouth at bedtime.     Marland Kitchen esomeprazole (NEXIUM) 40 MG capsule Take 1 capsule (40 mg total) by mouth 2 (two) times daily before a meal. 60 capsule 5  . gabapentin (NEURONTIN) 100 MG capsule Take 300 mg by mouth 3 (three) times daily. Patient aking 300 mg one q am and one q noon and 2 qhs  0  . HYDROcodone-acetaminophen (NORCO/VICODIN) 5-325 MG tablet Take 1 tablet by mouth every 6 (six) hours as needed for moderate pain. 30 tablet 0  . hyoscyamine (LEVBID) 0.375 MG 12 hr tablet Take 0.375 mg by mouth 2 (two) times daily.     . Melatonin 5 MG CAPS Take 5 mg by mouth at bedtime.     . meloxicam (MOBIC) 15 MG tablet Take 15 mg by mouth every evening.     . methocarbamol (ROBAXIN) 500 MG tablet Take 2  tablets (1,000 mg total) by mouth every 6 (six) hours as needed for muscle spasms. (Patient taking differently: Take 500-1,000 mg by mouth every 8 (eight) hours as needed for muscle spasms. ) 20 tablet 3  . traMADol (ULTRAM) 50 MG tablet Take 50 mg by mouth every 6 (six) hours as needed.     No current facility-administered medications for this visit.    Facility-Administered Medications Ordered in Other Visits  Medication Dose Route Frequency Provider Last Rate Last Dose  . 0.9 %  sodium chloride infusion   Intravenous Once Truitt Merle, MD        REVIEW OF SYSTEMS:  Constitutional: Denies fevers, chills or abnormal night sweats Eyes: Denies blurriness of vision, double vision or watery eyes Ears, nose, mouth, throat, and face: Denies mucositis or sore throat Respiratory: Denies cough or wheezes  (+) occasional SOB Cardiovascular: Denies palpitation, chest discomfort or lower extremity swelling Gastrointestinal: (+) Intermittent constipation with some diarrhea (+) abdominal hernia behind  stoma UI: (+) Vaginial disharge Skin: Denies abnormal skin rashes MSK: (+) Mylagia in b/l legs  Lymphatics: Denies new lymphadenopathy or easy bruising Neurological:Denies new weaknesses (+) Neuropathy in b/l feet Behavioral/Psych: Mood is stable, no new changes  All other systems were reviewed with the patient and are negative.  PHYSICAL EXAMINATION:  ECOG PERFORMANCE STATUS: 2  Vitals:   12/15/17 0929  BP: (!) 143/93  Pulse: 61  Resp: 18  Temp: 98.1 F (36.7 C)  SpO2: 98%   Filed Weights   12/15/17 0929  Weight: (!) 323 lb (146.5 kg)    GENERAL:alert, no distress and comfortable SKIN: skin color, texture, turgor are normal, no rashes or significant lesions EYES: normal, conjunctiva are pink and non-injected, sclera clear OROPHARYNX:no exudate, no erythema and lips, buccal mucosa, and tongue normal  NECK: supple, thyroid normal size, non-tender, without nodularity LYMPH:  no palpable lymphadenopathy in the cervical, axillary or inguinal LUNGS: clear to auscultation and percussion with normal breathing effort HEART: regular rate & rhythm and no murmurs and no lower extremity edema ABDOMEN:abdomen soft, normal bowel sounds. No organomegaly. No ascites. (+) Multiple scars in the abdomen and all well healed. A wound in the suprapubic area covered by gauze. Tenderness beneath her scar. (+) abdominal hernia behind stoma MSK: No cyanosis of digits and no clubbing  PSYCH: alert & oriented x 3 with fluent speech NEURO: no focal motor/sensory deficits   LABORATORY DATA:  I have reviewed the data as listed CBC Latest Ref Rng & Units 12/15/2017 05/30/2017 12/25/2016  WBC 3.9 - 10.3 K/uL 7.0 6.7 6.7  Hemoglobin 11.6 - 15.9 g/dL 12.9 12.5 12.2  Hematocrit 34.8 - 46.6 % 38.8 38.2 38.6  Platelets 145 - 400 K/uL 254 253 239    CMP Latest Ref Rng & Units 12/15/2017 05/30/2017 12/25/2016  Glucose 70 - 140 mg/dL 100 97 88  BUN 7 - 26 mg/dL 17 17.7 13.3  Creatinine 0.60 - 1.10 mg/dL 0.73 0.7  0.7  Sodium 136 - 145 mmol/L 140 140 143  Potassium 3.5 - 5.1 mmol/L 4.2 4.3 4.2  Chloride 98 - 109 mmol/L 105 - -  CO2 22 - 29 mmol/L _0 Calcium 8.4 - 10.4 mg/dL 9.8 9.7 9.7  Total Protein 6.4 - 8.3 g/dL 7.4 7.2 7.1  Total Bilirubin 0.2 - 1.2 mg/dL 0.4 0.52 0.36  Alkaline Phos 40 - 150 U/L 88 89 101  AST 5 - 34 U/L _1 ALT 0 - 55 U/L 17 22  18     Results for AERIN, DELANY (MRN [id number]) as of 12/12/2017 13:18  Ref. Range 06/25/2016 09:22 09/24/2016 08:55 12/25/2016 09:15 05/30/2017 10:09  CEA ([facility]) Latest Ref Range: 0.00 - 5.00 ng/mL 1.73 1.34 1.42 2.27  Pending for 12/15/17   PATHOLOGY REPORT  Diagnosis 07/12/16 1. Colon, segmental resection, Proximal - FOCAL MESENTERIC AND SUBSEROSAL FIBROSIS WITH ASSOCIATED HEMORRHAGE. - TWO BENIGN REACTIVE LYMPH NODES. - NO EVIDENCE OF MALIGNANCY. 2. Colon, segmental resection, Distal - ULCERATION WITH INFLAMMATION AND INFLAMED GRANULATION TISSUE WITH FOREIGN BODY GIANT CELL REACTION, CLINICALLY COLOVAGINAL FISTULA. - NO EVIDENCE OF MALIGNANCY. Claudette Laws MD Pathologist, Electronic Signature (Case signed 07/16/2016)  Diagnosis 05/25/2015 1. Colon, segmental resection for tumor, recto-sigmoid INVASIVE COLONIC ADENOCARCINOMA (0.2 CM IN GREATEST DIMENSION RESIDUAL TUMOR) THE TUMOR INVADE SUBMUCOSA (0.3 CM IN DEPTH, PT1) MARGINS OF RESECTION ARE NEGATIVE FOR TUMOR TWELVE BENIGN LYMPH NODES (0/12) 2. Colon, resection margin (donut), proximal anastomotic ring BENIGN COLONIC TISSUE, NEGATIVE FOR MALIGANCY 3. Colon, resection margin (donut), distal anastomotic ring BENIGN COLONIC TISSUE, NEGATIVE FOR MALIGNANCY 4. Ovary, right BENIGN SEROUS CYSTADENOMA.  Microscopic Comment 1. COLON AND RECTUM (INCLUDING TRANS-ANAL RESECTION): Specimen: Colon-rectum Procedure: Segmental resection Tumor site: Rectum anterior wall mid to distal third of rectum Specimen integrity: intact Macroscopic intactness of  mesorectum: Complete: _ Macroscopic tumor perforation: Negative Invasive tumor: Maximum size: Histologic type(s): G2 Histologic grade and differentiation: G1: well differentiated/low grade G2: moderately differentiated/low grade G3: poorly differentiated/high grade G4: undifferentiated/high grade Type of polyp in which invasive carcinoma arose: _ Microscopic extension of invasive tumor:Submucosa Lymph-Vascular invasion: Not identified Peri-neural invasion: negative Tumor deposit(s) (discontinuous extramural extension): Negative Resection margins: Proximal margin: Negative Distal margin: Negative Circumferential (radial) (posterior ascending, posterior descending; lateral and posterior mid-rectum; and entire lower 1/3 rectum):Negatvie Mesenteric margin (sigmoid and transverse): Negative Distance closest margin (if all above margins negative): 2.5 cm Treatment effect (neo-adjuvant therapy): Present Additional polyp(s): Negative Non-neoplastic findings: Unremarkable Lymph nodes: number examined 12; number positive: 0 Pathologic Staging: T1, N0, M_ Ancillary studies: Preserved expression of the makor and minor MMR proteins MLH1, MSH2, MSH6 and PMS2. MLH1:    RADIOGRAPHIC STUDIES: I have personally reviewed the radiological images as listed and agreed with the findings in the report.   CT CAP W Contrast 12/09/17  IMPRESSION: 1. Stable post treatment changes in the presacral space with no evidence of recurrent disease in the pelvis. 2. No evidence of metastatic disease in the chest, abdomen or pelvis. 3. Large parastomal hernia has increased in size and contains much of the remnant left colon. No evidence of bowel obstruction or ischemia. 4. Stable dilated main pulmonary artery, suggesting chronic pulmonary arterial hypertension. 5.  Aortic Atherosclerosis (ICD10-I70.0).    CT chest, Abdomen Pelvis W Contrast 12/26/2016 IMPRESSION: 1. Stable CT exam of the chest.  No  evidence for metastatic disease. 2. Interval left colectomy with left abdominal end colostomy. Presacral soft tissue is similar to prior study when taking into account the interval surgery. 3. Hepatic dome lesion seen on the previous study not evident today, potentially related to bolus timing. The 2.4 cm subcapsular inferior right liver lesion remains subtle and is barely visible on today's exam but shows no substantial interval change in size. Continued attention on follow-up recommended.    ASSESSMENT & PLAN: Ms. Cange is a 52 y.o. female with chronic abdominal pain with rectal mass on colonoscopy found to be invasive rectal adenocarcinoma.   1. T3N2M0, Stage IIIC rectal adenocarcinoma, ypT1N0 after neoadjuvant chemotherapy and radiation -  I previously reviewed her surgical pathology result in great detail with patient and her husband. She has had a good response to neoadjuvant chemoradiation, has a small residual tumor on the surgical specimen, no lymph node metastasis. -I recommended adjuvant chemotherapy, however she was not been able to take it due to the pelvic abscess after surgery and a prolonged recovery. -She had multiple CT of the abdomen and pelvis after surgery which did not show evidence of recurrence. Her tumor marker CEA has been normal. -The patient had a colostomy revision on 05/31/16, she had a colonoscopy prior to this, which was unremarkable except a few polyps. -We discussed her CT AP scan on 07/04/16 showing subcutaneous fluid collection at the site of ileostomy takedown and a large collection of stool within the presacral space. No evidence of cancer recurrence. 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 -I reviewed her surveillance CT scan from 12/26/2016, which showed no evidence of recurrence. Her liver lesions are stable, previously evaluated by MRI, which was consistent with hemangioma.   -Surveillance CT CAP from 12/09/17 shows stable post treatment changes with no evidence of recurrent disease or metastatic disease. Also shows parastomal hernia with no evidence of bowel obstruction. There is concern for pulmonary hypertention due to dilated main pulmonary artery.  -I recommend she discuss her dilated main pulmonary artery with Dr. Dema Severin for any needed pulmonary work up.  -It is 3 years since her diagnosis. From a rectal cancer standpoint she is doing well. CBC is WNL, CEA and CMP are still pending. No clinical concern for cancer recurrance.  -I recommend she follow up with Dr. Johney Maine soon given her vaginal bleeding and abdominal hernia. She is agreeable.  -I encouraged her to continue to work on losing weight through a healthy diet and exercise.  -We'll continue colon cancer surveillance for a total of 5 years -plan to see her back with lab in 6 months   2. Rectal, vaginal discharge -Continues to have constant vaginal and rectal discharge after abdominopelvic surgery in 2017.  -Pelvic PT has helped with this some.  -Last colonoscopy was >1 year ago prior to lap colorectal anastomosis resection/end colostomy/omentopexy of rectal stump and vaginal cuff procedure; she reports being on 3 year follow up per Dr. Amedeo Plenty.  -Her Gynolocologist has previously put her on clindamycin and diflucan rounds. -Her Vaginal discharge perisists into 2019. She continues to see her Gynocologist, PCP and I recommend she also see Dr. Johney Maine soon as well.    3. Bilateral Leg muscle cramps and neuropathy in feet -Given it has been 2.5 years since her chemo treatment, it is unusual for her neuropathy to perist and worsen at this point. I have low suspicion this is related to her prior chemo.  -She is currently on Gabapentin and Cymbalta and takes meloxicam and tramadol for pain. -She continues to see orthopedist, neurologist and her PCP.  -I suggest she try magneisum and calcium supplements for her muscle  cramps. I also suggest acupuncture for her neuropathy.    4. 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 Johney Maine, she is status post ileostomy which was changed to permanent colostomy. She still has moderate low abdomen pain, possible related to surgical scars from his previously multiple surgery and chewing tube placement.  -She has undergone physical therapy -Her diarrhea has resolved after permanent colostomy. -I previously recommended  she see a pain specialist, she denied. I prescribed Vicodin previously  -She will get intermittnet constipation which will resolve with an episode of diarrhea. And abdominal pain is overall managed, improved lately.      5. HTN -She'll continue follow-up with her primary care physician  6. Fatigue  -I previously advised the patient that exercise could help with fatigue.  -due to her worsening neuropathy, she has stopped working again in March 2019. -I again encouraged her to be physically active.   Plan: -Lab and scan reviewed, no evidence of recurrence --Follow-up in 6 months.  I encouraged patient to see Dr. Johney Maine in the next few months   I spent 30 mins for the visit, >50% face to face discussion.   Truitt Merle  12/15/2017   Oneal Deputy, am acting as scribe for Truitt Merle, MD.   I have reviewed the above documentation for accuracy and completeness, and I agree with the above.

## 2017-12-15 ENCOUNTER — Inpatient Hospital Stay: Payer: BLUE CROSS/BLUE SHIELD

## 2017-12-15 ENCOUNTER — Encounter: Payer: Self-pay | Admitting: Hematology

## 2017-12-15 ENCOUNTER — Telehealth: Payer: Self-pay | Admitting: Hematology

## 2017-12-15 ENCOUNTER — Inpatient Hospital Stay: Payer: BLUE CROSS/BLUE SHIELD | Attending: Hematology | Admitting: Hematology

## 2017-12-15 VITALS — BP 143/93 | HR 61 | Temp 98.1°F | Resp 18 | Ht 70.0 in | Wt 323.0 lb

## 2017-12-15 DIAGNOSIS — R5383 Other fatigue: Secondary | ICD-10-CM | POA: Insufficient documentation

## 2017-12-15 DIAGNOSIS — N898 Other specified noninflammatory disorders of vagina: Secondary | ICD-10-CM | POA: Diagnosis not present

## 2017-12-15 DIAGNOSIS — C20 Malignant neoplasm of rectum: Secondary | ICD-10-CM

## 2017-12-15 DIAGNOSIS — G629 Polyneuropathy, unspecified: Secondary | ICD-10-CM | POA: Insufficient documentation

## 2017-12-15 DIAGNOSIS — R109 Unspecified abdominal pain: Secondary | ICD-10-CM | POA: Insufficient documentation

## 2017-12-15 DIAGNOSIS — Z923 Personal history of irradiation: Secondary | ICD-10-CM | POA: Diagnosis not present

## 2017-12-15 DIAGNOSIS — Z6841 Body Mass Index (BMI) 40.0 and over, adult: Secondary | ICD-10-CM

## 2017-12-15 DIAGNOSIS — R252 Cramp and spasm: Secondary | ICD-10-CM | POA: Diagnosis not present

## 2017-12-15 DIAGNOSIS — Z90710 Acquired absence of both cervix and uterus: Secondary | ICD-10-CM | POA: Diagnosis not present

## 2017-12-15 DIAGNOSIS — Z85048 Personal history of other malignant neoplasm of rectum, rectosigmoid junction, and anus: Secondary | ICD-10-CM | POA: Insufficient documentation

## 2017-12-15 DIAGNOSIS — I10 Essential (primary) hypertension: Secondary | ICD-10-CM | POA: Diagnosis not present

## 2017-12-15 DIAGNOSIS — Z9221 Personal history of antineoplastic chemotherapy: Secondary | ICD-10-CM | POA: Diagnosis not present

## 2017-12-15 LAB — COMPREHENSIVE METABOLIC PANEL
ALK PHOS: 88 U/L (ref 40–150)
ALT: 17 U/L (ref 0–55)
ANION GAP: 9 (ref 3–11)
AST: 16 U/L (ref 5–34)
Albumin: 4 g/dL (ref 3.5–5.0)
BUN: 17 mg/dL (ref 7–26)
CO2: 26 mmol/L (ref 22–29)
Calcium: 9.8 mg/dL (ref 8.4–10.4)
Chloride: 105 mmol/L (ref 98–109)
Creatinine, Ser: 0.73 mg/dL (ref 0.60–1.10)
GFR calc Af Amer: >60 mL/min (ref >60)
GFR calc non Af Amer: >60 mL/min (ref >60)
GLUCOSE: 100 mg/dL (ref 70–140)
POTASSIUM: 4.2 mmol/L (ref 3.5–5.1)
SODIUM: 140 mmol/L (ref 136–145)
Total Bilirubin: 0.4 mg/dL (ref 0.2–1.2)
Total Protein: 7.4 g/dL (ref 6.4–8.3)

## 2017-12-15 LAB — CEA (IN HOUSE-CHCC): CEA (CHCC-In House): 1.53 ng/mL (ref 0.00–5.00)

## 2017-12-15 LAB — CBC WITH DIFFERENTIAL/PLATELET
Basophils Absolute: 0.1 10*3/uL (ref 0.0–0.1)
Basophils Relative: 1 %
EOS ABS: 0.3 10*3/uL (ref 0.0–0.5)
Eosinophils Relative: 5 %
HEMATOCRIT: 38.8 % (ref 34.8–46.6)
HEMOGLOBIN: 12.9 g/dL (ref 11.6–15.9)
Lymphocytes Relative: 17 %
Lymphs Abs: 1.2 10*3/uL (ref 0.9–3.3)
MCH: 28.8 pg (ref 25.1–34.0)
MCHC: 33.4 g/dL (ref 31.5–36.0)
MCV: 86.2 fL (ref 79.5–101.0)
MONOS PCT: 5 %
Monocytes Absolute: 0.4 10*3/uL (ref 0.1–0.9)
NEUTROS ABS: 5 10*3/uL (ref 1.5–6.5)
NEUTROS PCT: 72 %
Platelets: 254 10*3/uL (ref 145–400)
RBC: 4.5 MIL/uL (ref 3.70–5.45)
RDW: 14.4 % (ref 11.2–14.5)
WBC: 7 10*3/uL (ref 3.9–10.3)

## 2017-12-15 NOTE — Telephone Encounter (Signed)
Scheduled appt per YF - still placing LOS - per YF lab and f./u in 6 months. Pt aware of appt date and time.

## 2017-12-16 ENCOUNTER — Encounter: Payer: Self-pay | Admitting: Genetics

## 2017-12-16 ENCOUNTER — Inpatient Hospital Stay (HOSPITAL_BASED_OUTPATIENT_CLINIC_OR_DEPARTMENT_OTHER): Payer: BLUE CROSS/BLUE SHIELD | Admitting: Genetics

## 2017-12-16 ENCOUNTER — Inpatient Hospital Stay: Payer: BLUE CROSS/BLUE SHIELD

## 2017-12-16 DIAGNOSIS — Z808 Family history of malignant neoplasm of other organs or systems: Secondary | ICD-10-CM | POA: Diagnosis not present

## 2017-12-16 DIAGNOSIS — C20 Malignant neoplasm of rectum: Secondary | ICD-10-CM

## 2017-12-16 DIAGNOSIS — Z800 Family history of malignant neoplasm of digestive organs: Secondary | ICD-10-CM | POA: Diagnosis not present

## 2017-12-16 DIAGNOSIS — Z803 Family history of malignant neoplasm of breast: Secondary | ICD-10-CM | POA: Insufficient documentation

## 2017-12-16 NOTE — Progress Notes (Signed)
REFERRING PROVIDER: Truitt Merle, MD [address] [place], [place] [address]  PRIMARY PROVIDER:  [name], MD  PRIMARY REASON FOR VISIT:  1. Rectal adenocarcinoma s/p LAR resection 05/25/2015   2. Family history of pancreatic cancer   3. Family history of skin cancer   4. Family history of breast cancer     HISTORY OF PRESENT ILLNESS:   Lindsay Little, a 52 y.o. female, was seen for a [facility] cancer genetics consultation at the request of Dr. Burr Medico due to a personal and family history of cancer.  Lindsay Little presents to clinic today to discuss the possibility of a hereditary predisposition to cancer, genetic testing, and to further clarify her future cancer risks, as well as potential cancer risks for family members.   In July 2016, at the age of 25, Lindsay Little was diagnosed with rectal adenocarcinoma. She had neoadjuvant chemotherapy followed by a rectosigmoid segmental resection.   CANCER HISTORY:  Oncology History   Rectal adenocarcinoma   Staging form: Colon and Rectum, AJCC 7th Edition     Clinical: T3, N2, M0 - Unsigned       Rectal adenocarcinoma s/p LAR resection 05/25/2015   01/13/2015 Initial Diagnosis    Rectal adenocarcinoma      01/13/2015 Procedure    Colonoscopy showed a sensitivity O nonobstructing mass in the rectum and from 12-18 cm proximal to the Annis. The mass was circumferential, measuring about 6 cm in length. EGD was negative.      01/13/2015 Cancer Staging    Staging form: Colon and Rectum, AJCC 7th Edition - Clinical stage from 01/13/2015: T3, N2, M0 - Signed by Truitt Merle, MD on 12/15/2017      01/17/2015 Tumor Marker    CEA 1.4, CA-19-9 8, MMR normal       01/18/2015 Procedure    Lower endoscopic ultrasound by Dr. Paulita Fujita showed a T3 N2 rectal mass.      01/20/2015 Imaging    CT abdomen and pelvis with contrast showed right lateral rectal wall exophytic mass, and 2 ill-defined hepatic hypoenhancing lesions which appears to correspond to the  lesion seen on the prior MRI in 2015.      02/05/2015 Imaging    abdomen MRI showed 2 hemangioma, no suspicion for metastatic disease. CT chest was negative      02/20/2015 - 03/30/2015 Radiation Therapy    neoadjuvant RT to rectal cancer       02/20/2015 Concurrent Chemotherapy    capecitabine 2500 mg in the morning and 2000 mg in the evening (840m/m2, bid), on the day of radiation.       05/25/2015 Surgery    Recto-sigmoid segmental resection, margins are negative       05/25/2015 Pathology Results    0.2cm residual invasive adenocarcinoma, G2, negative margins, 12 nodes were negative       05/25/2015 Cancer Staging    Staging form: Colon and Rectum, AJCC 7th Edition - Pathologic stage from 05/25/2015: Stage I (T1, N0, cM0) - Signed by FTruitt Merle MD on 12/15/2017      06/23/2015 - 06/29/2015 Hospital Admission    Patient was admitted for pelvic abscess, drain placed and she was treated with IV antibiotics, she also received 1 dose of Feraheme for anemia.      09/15/2015 - 09/17/2015 Hospital Admission    Recurrent pelvic collection s/p perc drainage 09/16/2015      02/21/2016 Imaging    CT abdomen and pelvis w contrast IMPRESSION: 1. Interval removal  of surgical drains since 12/19/2015 from the presacral space. Similar size of ill-defined presacral fluid and gas, for which residual abscess cannot be excluded. Similar amount of intraperitoneal edema throughout the upper pelvis with foci of extraluminal gas, possibly related to the presacral process. 2. Diverting right-sided ileostomy, without acute complication. 3. Similar moderate hydroureteronephrosis, likely due to the pelvic process. 4. Possible bladder wall thickening. Correlate with urinalysis. This appearance could be partially due to underdistention. 5. Geographic hepatic steatosis and hemangiomas. Cannot exclude a new right hepatic lobe lesion. Consider nonemergent pre and post contrast outpatient MRI. This could  represent a new area of focal steatosis.      05/31/2016 Surgery    ileostomy takedown BY Dr. Johney Maine       07/04/2016 Imaging    CT Abdomen Pelvis W Contrast 07/04/16 IMPRESSION: Subcutaneous fluid collection at the site of ileostomy takedown, could be sterile or infected, measuring 9.3 x 4.3 x 5.3 cm; this could be aspirated under ultrasound guidance if clinically indicated. Large collection of stool within the presacral space, could potentially be within a distended distorted rectum but since this occupies the same position as the abscess collection identified on the previous CT this is suspicious for a contiguous contained extraluminal stool collection communicating with the rectosigmoid colon, collection overall roughly 7 x 8 x 9 cm.      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      12/26/2016 Imaging    IMPRESSION: 1. Stable CT exam of the chest.  No evidence for metastatic disease. 2. Interval left colectomy with left abdominal end colostomy. Presacral soft tissue is similar to prior study when taking into account the interval surgery. 3. Hepatic dome lesion seen on the previous study not evident today, potentially related to bolus timing. The 2.4 cm subcapsular inferior right liver lesion remains subtle and is barely visible on today's exam but shows no substantial interval change in size. Continued attention on follow-up recommended.       12/09/2017 Imaging    CT CAP W Contrast 12/09/17  IMPRESSION: 1. Stable post treatment changes in the presacral space with no evidence of recurrent disease in the pelvis. 2. No evidence of metastatic disease in the chest, abdomen or pelvis. 3. Large parastomal hernia has increased in size and contains much of the remnant left colon. No evidence of bowel obstruction or ischemia. 4. Stable dilated main pulmonary artery, suggesting chronic pulmonary  arterial hypertension. 5.  Aortic Atherosclerosis (ICD10-I70.0).        HORMONAL RISK FACTORS:  Menarche was at age 79.  First live birth at age N/A.  Ovaries intact: yes. 1 intact, 1 removed during rectal surgery.  Hysterectomy: yes. 1996 due to severe fibroids.  Menopausal status: premenopausal.  HRT use: 0 years. Colonoscopy: yes; most recent was in July 2018, was told to return for her next one in 3 years.   Mammogram within the last year: yes. Number of breast biopsies: 0.   Past Medical History:  Diagnosis Date  . Anxiety   . Chemotherapy-induced neuropathy (HCC)    toes and fingers numbness and tingling  . Colostomy in place [facility]) 07/12/2016   due to anastomosis breakdown w/ colovaginal fistula  . Complication of anesthesia   . Depression   . Family history of adverse reaction to anesthesia    mother-- ponv  . Family history of breast cancer   . Family history of pancreatic  cancer   . Family history of skin cancer   . Gastroenteritis 12/20/2015  . GERD (gastroesophageal reflux disease)   . Hiatal hernia   . History of cancer chemotherapy 02-20-2015 to 03-30-2015  . History of cardiac murmur as a child   . History of chemotherapy   . History of chronic gastritis   . History of hypertension    no issues since multiple abdminal sx's and chemo--- no medication since 12/ 2016  . History of TMJ disorder   . Hypercholesteremia   . IBS (irritable bowel syndrome)    dx age 54  . Intermittent abdominal pain    post-op multiple abdominal sx's   . Microcytic anemia   . Mild sleep apnea    per pt study 2014  very mild osa , no cpap recommended, recommeded wt loss and sleep routine  . OA (osteoarthritis)    left knee /  left shoulder  . Obesity   . PONV (postoperative nausea and vomiting)    severe" needs Scopolamine PATCH"   . Portacath in place    right chest  . Rectal adenocarcinoma ([place]) oncologis-  dr Burr Medico-- after radiation/ chemo (ypT1, N0) --  no recurrence per  last note 03/ 2018   dx 01-13-2015-- Stage IIIC (T3, N2, M0) post concurrent radiation and chemotherapy 02-20-2015 to 03-30-2015 /  05-25-2015 s/p  LAR w/ RSO (post-op complicated by late abscess and contained anatomotic leak w/ help percutaneous drainage and antibiotics)   . Rotator cuff tear, left   . S/P radiation therapy 02/20/15-03/30/15   colon/rectal  . Vitamin D deficiency   . Wears glasses   . Wears glasses     Past Surgical History:  Procedure Laterality Date  . ABDOMINAL HYSTERECTOMY  1996   uterus and cervix  . COLON RESECTION N/A 07/12/2016   Procedure: LAPAROSCOPIC LYSIS OF ADHESIONS, OMENTOPEXY, HAND ASSISTED RESECTION OF  COLON, END TO END ANASTOMOSIS, COLOSTOMY;  Surgeon: Michael Boston, MD;  Location: [facility];  Service: General;  Laterality: N/A;  . COMBINED HYSTEROSCOPY DIAGNOSTIC / D&C  x2 1990's  . DIAGNOSTIC LAPAROSCOPY  age 59 and age 40  . EUS N/A 01/18/2015   Procedure: LOWER ENDOSCOPIC ULTRASOUND (EUS);  Surgeon: Arta Silence, MD;  Location: [facility];  Service: Endoscopy;  Laterality: N/A;  . EXCISION OF SKIN TAG  11/17/2015   Procedure: EXCISION OF SKIN TAG;  Surgeon: Michael Boston, MD;  Location: [facility];  Service: General;;  . ILEO LOOP COLOSTOMY CLOSURE N/A 11/17/2015   Procedure: LAPAROSCOPIC DIVERTING LOOP ILEOSTOMY  DRAINAGE OF PELVIC ABSCESS;  Surgeon: Michael Boston, MD;  Location: [facility];  Service: General;  Laterality: N/A;  . ILEOSTOMY CLOSURE N/A 05/31/2016   Procedure: TAKEDOWN LOOP ILEOSTOMY;  Surgeon: Michael Boston, MD;  Location: [facility];  Service: General;  Laterality: N/A;  . IMPACTION REMOVAL  11/17/2015   Procedure: DISIMPACTION REMOVAL;  Surgeon: Michael Boston, MD;  Location: [facility];  Service: General;;  . KNEE ARTHROSCOPY Left 1990's  . KNEE ARTHROSCOPY W/ MENISCECTOMY Left 09/14/2009   and chondroplasty debridement  . LAPAROSCOPIC LYSIS OF ADHESIONS  11/17/2015   Procedure: LAPAROSCOPIC LYSIS OF ADHESIONS;  Surgeon: Michael Boston, MD;  Location: [facility];   Service: General;;  . PORT-A-CATH REMOVAL N/A 11/21/2016   Procedure: REMOVAL PORT-A-CATH;  Surgeon: Michael Boston, MD;  Location[facility];  Service: General;  Laterality: N/A;  . PORTACATH PLACEMENT N/A 05/25/2015   Procedure: INSERTION PORT-A-CATH;  Surgeon: Michael Boston, MD;  Location: [facility];  Service: General;  Laterality: N/A;-remains inplace Right chest.  . ROTATOR CUFF REPAIR Right 2006  . TONSILLECTOMY  age 39  . XI ROBOTIC ASSISTED LOWER ANTERIOR RESECTION N/A 05/25/2015   Procedure: XI ROBOTIC ASSISTED LOWER ANTERIOR RESECTION, , RIGID PROCTOSCOPY, RIGHT OOPHORECTOMY;  Surgeon: Michael Boston, MD;  Location: [facility];  Service: General;  Laterality: N/A;    Social History   Socioeconomic History  . Marital status: Married    Spouse name: Not on file  . Number of children: Not on file  . Years of education: Not on file  . Highest education level: Not on file  Occupational History  . Not on file  Social Needs  . Financial resource strain: Not on file  . Food insecurity:    Worry: Not on file    Inability: Not on file  . Transportation needs:    Medical: Not on file    Non-medical: Not on file  Tobacco Use  . Smoking status: Former Smoker    Packs/day: 0.25    Years: 7.00    Pack years: 1.75    Types: Cigarettes    Last attempt to quit: 07/16/1991    Years since quitting: 26.4  . Smokeless tobacco: Never Used  Substance and Sexual Activity  . Alcohol use: No  . Drug use: No  . Sexual activity: Yes    Birth control/protection: Surgical  Lifestyle  . Physical activity:    Days per week: Not on file    Minutes per session: Not on file  . Stress: Not on file  Relationships  . Social connections:    Talks on phone: Not on file    Gets together: Not on file    Attends religious service: Not on file    Active member of club or organization: Not on file    Attends meetings of clubs or organizations: Not on file    Relationship status: Not on file   Other Topics Concern  . Not on file  Social History Narrative   Tobacco Use: Cigarettes - Former Smoker   Alcohol: Yes, very rare, liquor.    No recreational drug use   Occupation: Head CMA @ [employer]   Marital Status: Married    Husband: Roselyn Reef Disabled   Children: 2 adopted kids Maggie & Quillian Quince   Religion: [employer] [place]:  We obtained a detailed, 4-generation family history.  Significant diagnoses are listed below: Family History  Problem Relation Age of Onset  . Coronary artery disease Mother 60  . Hypertension Mother   . Hyperlipidemia Mother   . Diabetes Mellitus I Mother   . Coronary artery disease Father   . Hyperlipidemia Father   . Hypertension Father   . Cancer Sister        skin - non melanoma  . Hyperlipidemia Brother   . Cancer Maternal Uncle 3       pancreatic with mets to colon and prostate  . Cirrhosis Maternal Uncle   . Hypertension Maternal Grandmother   . Diabetes Mellitus I Maternal Grandmother   . Hyperlipidemia Maternal Grandmother   . CVA Maternal Grandmother   . Hypertension Maternal Grandfather   . Coronary artery disease Maternal Grandfather 65  . Hyperlipidemia Maternal Grandfather   . Coronary artery disease Paternal Grandmother   . Hypertension Paternal Grandmother   . Hyperlipidemia Paternal Grandmother   . Diabetes Mellitus I  Paternal Grandmother   . Hypertension Paternal Grandfather   . Hyperlipidemia Paternal Grandfather   . Coronary artery disease Paternal Grandfather    Lindsay Little has 2 adopted children, no biological children. Lindsay Little has 1 brother who is 58 with no history of cancer.  He had a colonoscopy when she was diagnosed a couple of years ago that as far as she knows was normal.  He has 3 children.  Lindsay Little has a 67 year-old sister who has a history of skin cancer (not melanoma).  She had a colonoscopy and had no polyps identified.  She has a history of severe  fibroids and had a hysterectomy due to this issue.  She has 3 children.   Lindsay Little's father: died at 57 due to heart disease/CAD.  Paternal Aunts/Uncles: 1 paternal uncle in his 58's- unk/limited info. 1 paternal aunt with no known history of cancer.   Paternal cousins: no history of cancer.  Paternal grandfather: died at 36 due to heart disease.  Paternal grandmother:died in her 86's due to heart disease.   Lindsay Little's mother: 35, no history of cancer.  Has heart disease.  She had a colonoscopy in the past few years that was normal.  She has a history of uterine fibroids and had a hysterectomy.   Maternal Aunts/Uncles:3 paternal uncles.  1 was diagnosed with pancreatic cancer at 88 and died at 100 after his disease metastasized.  He had no history of smoking, and had a history of diabetes.   Maternal cousins: 1 maternal cousin was diagnosed with breast cancer >50.  No other cousins with known history of cancer.  Maternal grandfather: died in his 56's with no history of cancer.  He had heart disease.  Maternal grandmother:died in her 70's.  She had Alzheimer's disease and might have had cancer, but no work-up was pursued to confirm this.   Lindsay Little is unaware of previous family history of genetic testing for hereditary cancer risks. Patient's maternal ancestors are of [place] descent, and paternal ancestors are of [place] descent. There is no reported Ashkenazi Jewish ancestry. There is no known consanguinity.  GENETIC COUNSELING ASSESSMENT: Lindsay Little is a 52 y.o. female with a personal and family history which is somewhat suggestive of a Hereditary Cancer Predisposition Syndrome. We, therefore, discussed and recommended the following at today's visit.   DISCUSSION: We reviewed the characteristics, features and inheritance patterns of hereditary cancer syndromes. We also discussed genetic testing, including the appropriate family members to test, the process of testing, insurance  coverage and turn-around-time for results. We discussed the implications of a negative, positive and/or variant of uncertain significant result. We recommended Lindsay Little pursue genetic testing for the Multi-Cancers gene panel.   The Multi-Cancer Panel offered by [facility] includes sequencing and/or deletion duplication testing of the following 83 genes: ALK, APC, ATM, AXIN2,BAP1,  BARD1, BLM, BMPR1A, BRCA1, BRCA2, BRIP1, CASR, CDC73, CDH1, CDK4, CDKN1B, CDKN1C, CDKN2A (p14ARF), CDKN2A (p16INK4a), CEBPA, CHEK2, CTNNA1, DICER1, DIS3L2, EGFR (c.2369C>T, p.Thr790Met variant only), EPCAM (Deletion/duplication testing only), FH, FLCN, GATA2, GPC3, GREM1 (Promoter region deletion/duplication testing only), HOXB13 (c.251G>A, p.Gly84Glu), HRAS, KIT, MAX, MEN1, MET, MITF (c.952G>A, p.Glu318Lys variant only), MLH1, MSH2, MSH3, MSH6, MUTYH, NBN, NF1, NF2, NTHL1, PALB2, PDGFRA, PHOX2B, PMS2, POLD1, POLE, POT1, PRKAR1A, PTCH1, PTEN, RAD50, RAD51C, RAD51D, RB1, RECQL4, RET, RUNX1, SDHAF2, SDHA (sequence changes only), SDHB, SDHC, SDHD, SMAD4, SMARCA4, SMARCB1, SMARCE1, STK11, SUFU, TERC, TERT, TMEM127, TP53, TSC1, TSC2, VHL, WRN and WT1.   We discussed that only 5-10%  of cancers are associated with a Hereditary Cancer Predisposition Syndrome.  The most common hereditary cancer syndrome associated with colon cancer is Lynch Syndrome.  Lynch Syndrome is caused by mutations in the genes: MLH1, MSH2, MSH6, PMS2 and EPCAM.  This syndrome increases the risk for colon, uterine, ovarian and stomach cancers, as well as others.  Families with Lynch Syndrome tend to have multiple family members with these cancers, typically diagnosed under age 66, and diagnoses in multiple generations.    We discussed that there are several other genes that are associated with an increased risk for colon cancer and increased polyp burden (MUTYH, APC, POLE, CHEK2, etc.) We also dicussed that there are many genes that cause many different types of cancer  risks.    Lindsay Little had intact Immuno-histo chemistry (IHC) performed on her tumor.  These proteins were intact, so the suspicion for Lynch Syndrome is low.  However, there are other genes and synromes associated with an increased risk for colon and other types of cancer, and this screening test is not a perfect screen for Lynch Syndrome.    We discussed that if she is found to have a mutation in one of these genes, it may impact future medical management recommendations such as increased cancer screenings and consideration of risk reducing surgeries.  A positive result could also have implications for the patient's family members.  A Negative result would mean we were unable to identify a hereditary component to her cancer, but does not rule out the possibility of a hereditary basis for her cancer.  There could be mutations that are undetectable by current technology, or in genes not yet tested or identified to increase cancer risk.  We would recommend that her first degree relatives continue to have colonoscopies ever 5 years (or as directed by their doctors) and that she continue to follow all recommendations provided to her by her physicians.   We discussed the potential to find a Variant of Uncertain Significance or VUS.  These are variants that have not yet been identified as pathogenic or benign, and it is unknown if this variant is associated with increased cancer risk or if this is a normal finding.  Most VUS's are reclassified to benign or likely benign.   It should not be used to make medical management decisions. With time, we suspect the lab will determine the significance of any VUS's identified if any.   Based on Lindsay Little's personal and family history of cancer, she meets medical criteria for genetic testing. Despite that she meets criteria, she may still have an out of pocket cost. We discussed that the laboratory can provide her with an estimate of the cost with her insurance.     PLAN: After considering the risks, benefits, and limitations, Lindsay Little  provided informed consent to pursue genetic testing and the blood sample was sent to [facility] for analysis of the Multi-Cancer Panel. Results should be available within approximately 2-3 weeks' time, at which point they will be disclosed by telephone to Lindsay Little, as will any additional recommendations warranted by these results. Lindsay Little will receive a summary of her genetic counseling visit and a copy of her results once available. This information will also be available in Epic. We encouraged Ms. Gerst to remain in contact with cancer genetics annually so that we can continuously update the family history and inform her of any changes in cancer genetics and testing that may be of benefit for her family. Lindsay Little's  questions were answered to her satisfaction today. Our contact information was provided should additional questions or concerns arise.  Lastly, we encouraged Ms. Boyland to remain in contact with cancer genetics annually so that we can continuously update the family history and inform her of any changes in cancer genetics and testing that may be of benefit for this family.   Ms.  Byrer's questions were answered to her satisfaction today. Our contact information was provided should additional questions or concerns arise. Thank you for the referral and allowing [place] to share in the care of your patient.   Tana Felts, MS, [facility] Genetic Counselor [email or web address] phone: [phone]  The patient was seen for a total of 40 minutes in face-to-face genetic counseling. This patient was discussed with Dr. Burr Medico who agrees with above.  Pharmacy Tech, Raeanne Gathers was present and observed this session.

## 2017-12-22 ENCOUNTER — Telehealth: Payer: Self-pay | Admitting: Genetics

## 2017-12-22 NOTE — Telephone Encounter (Signed)
Left message asking if patient had new insurance card/ins number.   Left my phone and email/

## 2017-12-30 IMAGING — CT CT ABD-PELV W/ CM
2 of 5 series · 16 of 46 positions shown, 18 images · IV contrast (iopamidol)
Comparison: December 05, 2015

CLINICAL DATA: Abdomen pain for 3 days. Patient has a colostomy
with history of colon cancer.

Currently undergoing chemotherapy.
EXAM:
CT ABDOMEN AND PELVIS WITH CONTRAST
TECHNIQUE: Multidetector CT imaging of the abdomen and pelvis was performed
using the standard protocol following bolus administration of
intravenous contrast.
CONTRAST:  100mL [id number] IOPAMIDOL ([id number]) INJECTION 61%

[Series 2: rtn a/p with · axial · 0.88mm/px · z∈[-512,-62]mm · 13 of 104 slices shown, 15 images]
[im 7/104  soft-tissue]
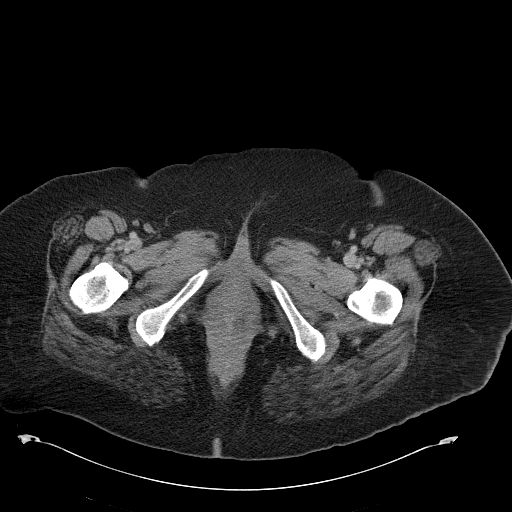
[im 7/104  bone]
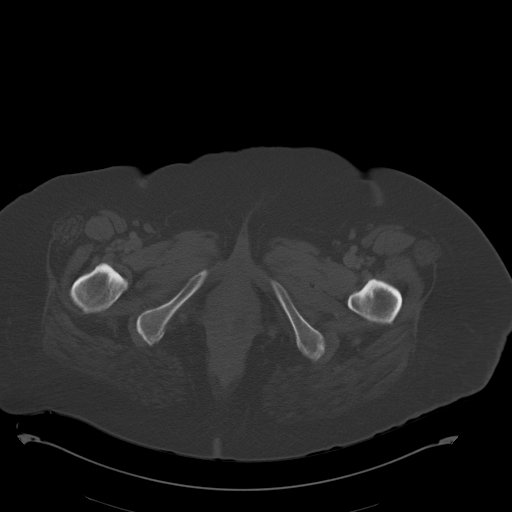
[im 13/104  soft-tissue]
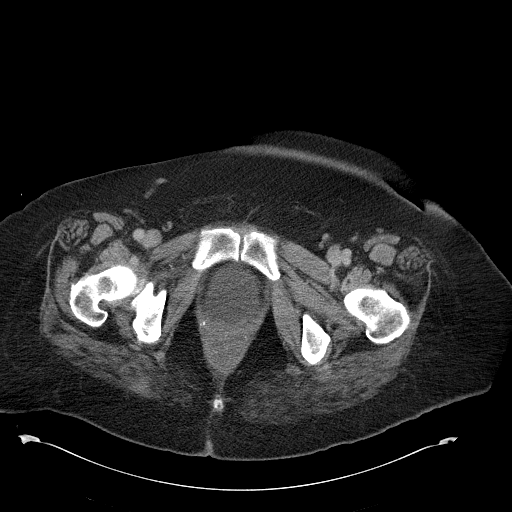
[im 25/104  soft-tissue]
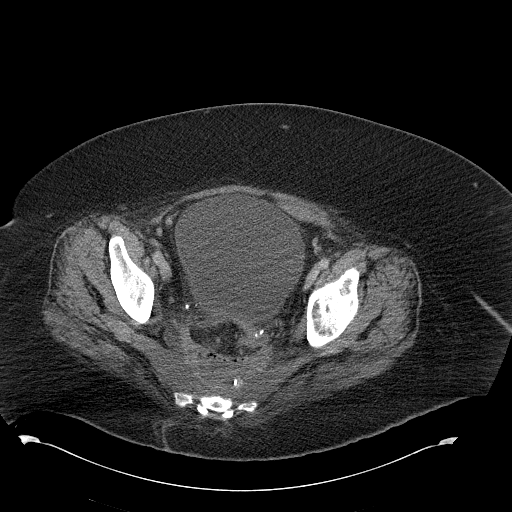
[im 31/104  soft-tissue]
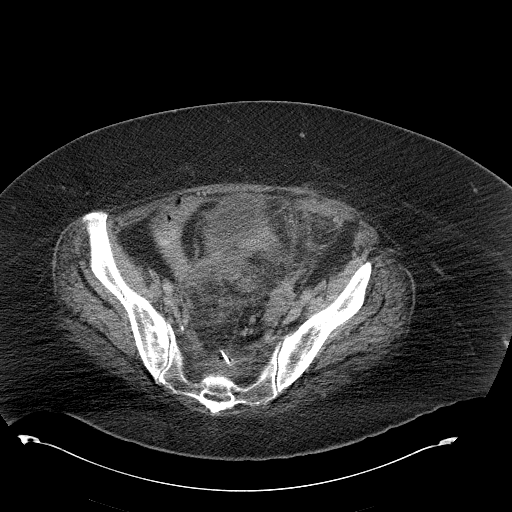
[im 37/104  soft-tissue]
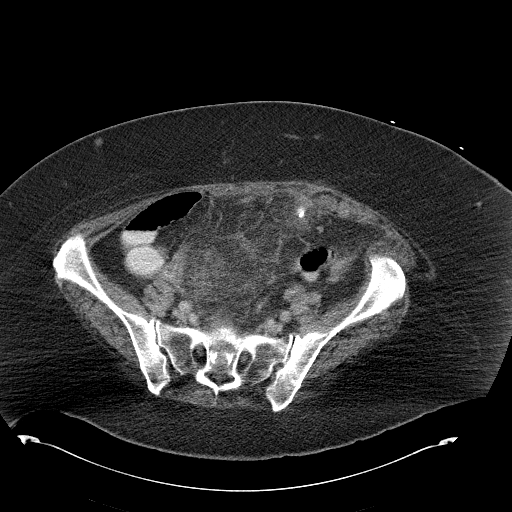
[im 43/104  soft-tissue]
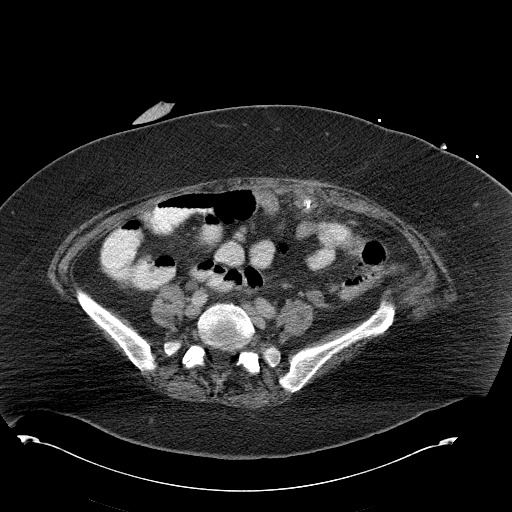
[im 55/104  soft-tissue]
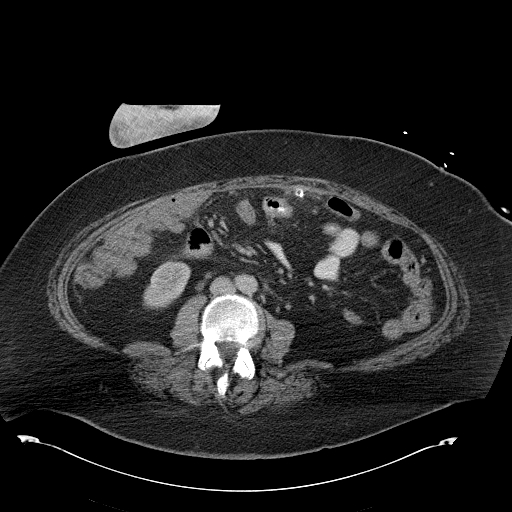
[im 61/104  soft-tissue]
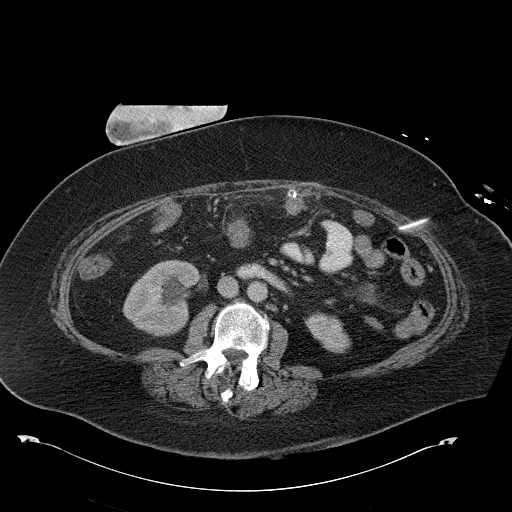
[im 67/104  soft-tissue]
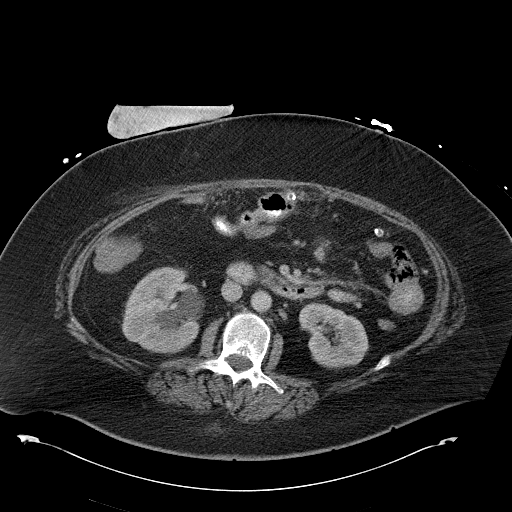
[im 67/104  bone]
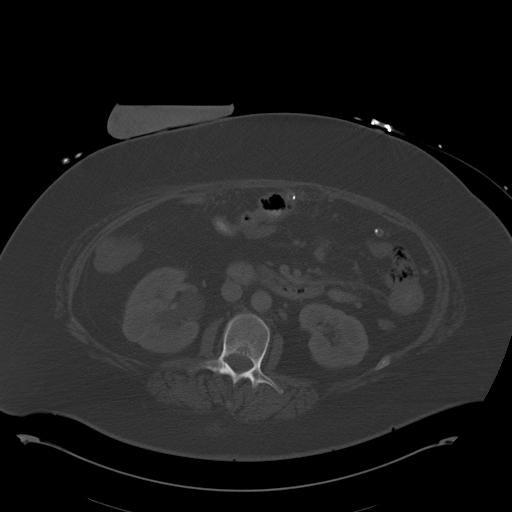
[im 73/104  soft-tissue]
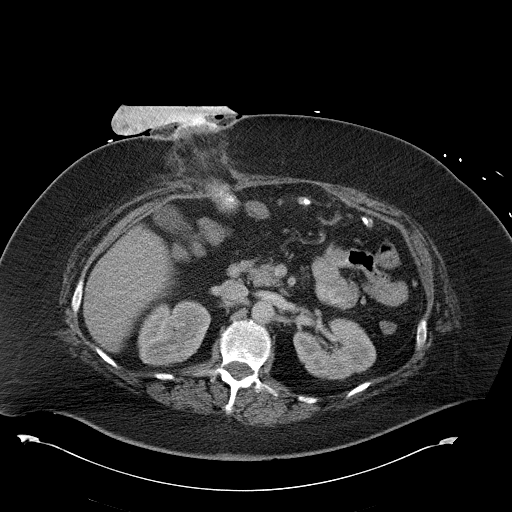
[im 79/104  soft-tissue]
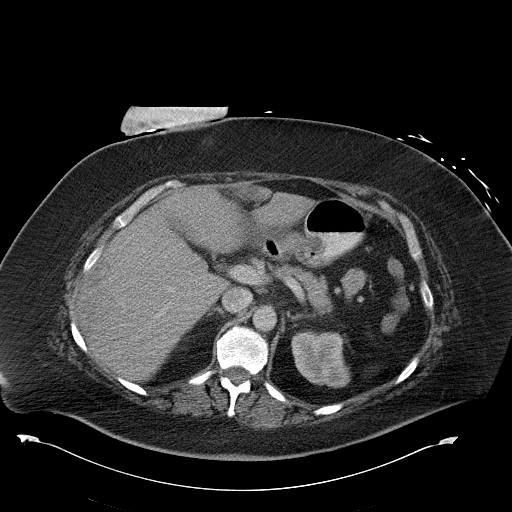
[im 91/104  soft-tissue]
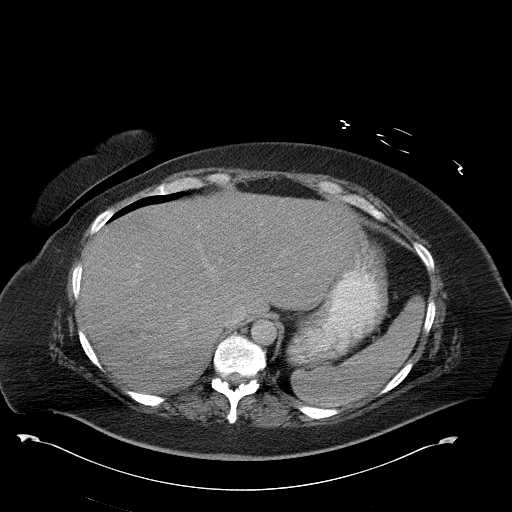
[im 97/104  soft-tissue]
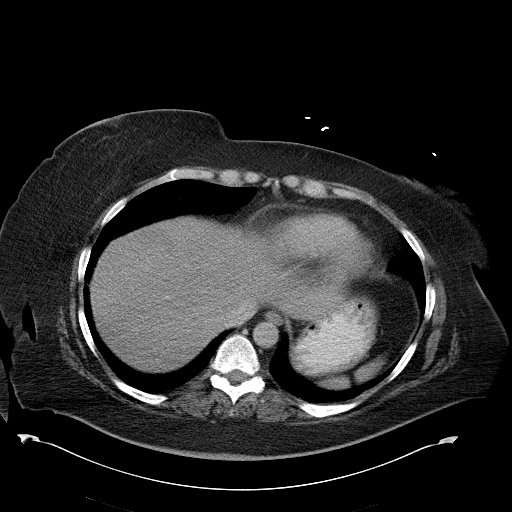

[Series 602: <mpr thick range> · coronal · 1.01mm/px · 3 of 149 slices shown]
[im 50/149  soft-tissue]
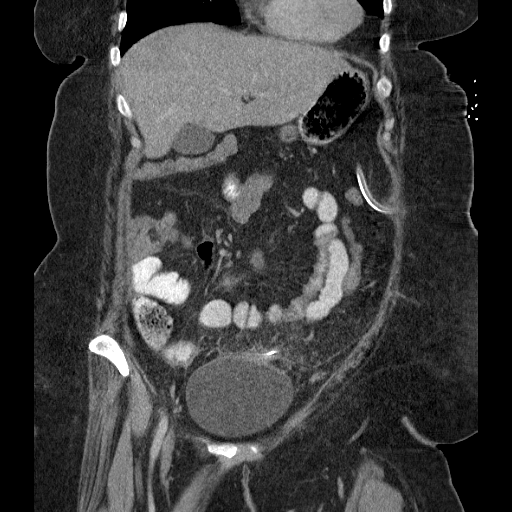
[im 66/149  soft-tissue]
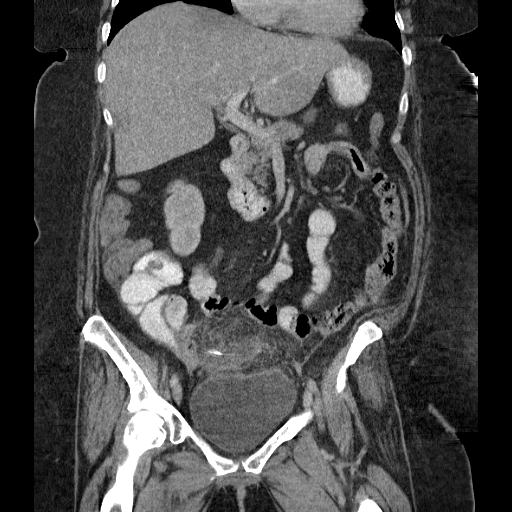
[im 83/149  soft-tissue]
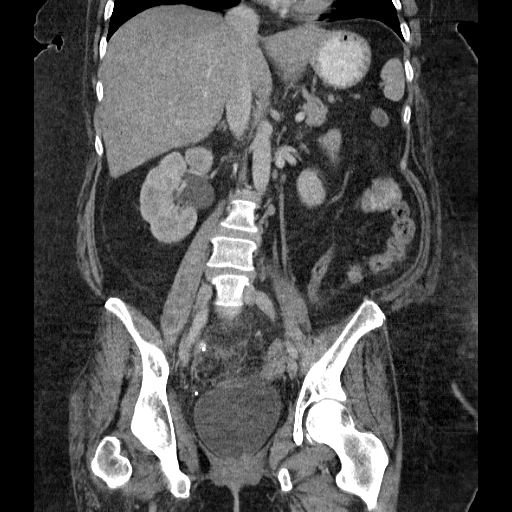

[16 of 46 positions shown; findings below may reference images not displayed]

FINDINGS: Lower chest: No acute findings. No focal pneumonia or pleural
effusion is identified. The heart size normal.

Hepatobiliary: There are stable liver lesions and low densities
unchanged compared to prior CT. No new lesions are identified. The
gallbladder is normal.

Pancreas: No mass, inflammatory changes, or other significant
abnormality.

Spleen: Within normal limits in size and appearance.

Adrenals/Urinary Tract: There is right hydronephrosis and
hydroureter without focal discrete obstructing stone identified in
the right collecting system. The left kidney is normal without
hydronephrosis. The adrenal glands are normal. The bladder is
normal.

Stomach/Bowel: A right abdominal ostomy is identified. The
previously noted thick walled bowel loops leading up to ostomy are
decreased with interval improvement. There is no bowel obstruction.
The previously noted pelvic abscess is smaller and her exam
measuring 7.1 x 4 cm. There pelvic drainage tube, the position is
unchanged but there is increased inflammation surrounding the tube.

Vascular/Lymphatic: No pathologically enlarged lymph nodes. No
evidence of abdominal aortic aneurysm.

Reproductive: No mass or other significant abnormality.

Other:  Small amount of ascites is noted.

Musculoskeletal: Degenerative joint changes of the spine are
identified. There is anterior listhesis of L4-5.
IMPRESSION: The previously noted abscess in the presacral region smaller
compared to the prior CT. However, there is interval increase
inflammation surrounding the pelvic drainage tube compared to the
previous exam.There is right hydronephrosis and right hydroureter
probably a 3 obstruction related a inflammation previously described
above the in pelvis.

Stable liver changes.

## 2017-12-31 ENCOUNTER — Telehealth: Payer: Self-pay | Admitting: Genetics

## 2017-12-31 NOTE — Telephone Encounter (Signed)
Revealed negative genetic testing.   This normal result is reassuring and indicates that it is unlikely Lindsay Little's cancer is due to a hereditary cause.  It is unlikely that there is an increased risk of another cancer due to a mutation in one of these genes.  However, genetic testing is not perfect, and cannot definitively rule out a hereditary cause.  It will be important for her to keep in contact with genetics to learn if any additional testing may be needed in the future.     Recommended siblings and parents have colonoscopies every 5 years(or as directed by physicians) informed her that her maternal relatives (due to panc cancer in mat uncle) also qualify for genetic testing.

## 2018-01-12 ENCOUNTER — Ambulatory Visit: Payer: Self-pay | Admitting: Genetics

## 2018-01-12 ENCOUNTER — Telehealth: Payer: Self-pay | Admitting: Cardiology

## 2018-01-12 ENCOUNTER — Encounter: Payer: Self-pay | Admitting: Genetics

## 2018-01-12 ENCOUNTER — Ambulatory Visit: Payer: Self-pay | Admitting: Surgery

## 2018-01-12 ENCOUNTER — Other Ambulatory Visit: Payer: Self-pay | Admitting: Neurological Surgery

## 2018-01-12 DIAGNOSIS — C20 Malignant neoplasm of rectum: Secondary | ICD-10-CM

## 2018-01-12 DIAGNOSIS — Z800 Family history of malignant neoplasm of digestive organs: Secondary | ICD-10-CM

## 2018-01-12 DIAGNOSIS — Z803 Family history of malignant neoplasm of breast: Secondary | ICD-10-CM

## 2018-01-12 DIAGNOSIS — Z1379 Encounter for other screening for genetic and chromosomal anomalies: Secondary | ICD-10-CM

## 2018-01-12 DIAGNOSIS — Z808 Family history of malignant neoplasm of other organs or systems: Secondary | ICD-10-CM

## 2018-01-12 NOTE — Telephone Encounter (Signed)
New Message    Patient is calling because she needs an appointment because she has a surgery on 08/01. Dr. Marlou Porch first available is 8/6. She says that Dr. Marlou Porch always works her in so that it needs to happen right away.  She also states that her PCP wants her to be seen. After talking with the patient found the referral and its IRS:WNIOEVO pulmonary artery on CT, R/O pulmonary HTN /Dr Skains/Has seen before  Please call.

## 2018-01-12 NOTE — Progress Notes (Signed)
HPI:  Lindsay Little was previously seen in the [place] [facility] clinic on 12/16/217 due to a personal and family history of cancer and concerns regarding a hereditary predisposition to cancer. Please refer to our prior cancer genetics clinic note for more information regarding Lindsay Little's medical, social and family histories, and our assessment and recommendations, at the time. Lindsay Little's recent genetic test results were disclosed to her, as well as recommendations warranted by these results. These results and recommendations are discussed in more detail below.  CANCER HISTORY:  Oncology History   Rectal adenocarcinoma   Staging form: Colon and Rectum, AJCC 7th Edition     Clinical: T3, N2, M0 - Unsigned       Rectal adenocarcinoma s/p LAR resection 05/25/2015   01/13/2015 Initial Diagnosis    Rectal adenocarcinoma      01/13/2015 Procedure    Colonoscopy showed a sensitivity O nonobstructing mass in the rectum and from 12-18 cm proximal to the Annis. The mass was circumferential, measuring about 6 cm in length. EGD was negative.      01/13/2015 Cancer Staging    Staging form: Colon and Rectum, AJCC 7th Edition - Clinical stage from 01/13/2015: T3, N2, M0 - Signed by Truitt Merle, MD on 12/15/2017      01/17/2015 Tumor Marker    CEA 1.4, CA-19-9 8, MMR normal       01/18/2015 Procedure    Lower endoscopic ultrasound by Dr. Paulita Fujita showed a T3 N2 rectal mass.      01/20/2015 Imaging    CT abdomen and pelvis with contrast showed right lateral rectal wall exophytic mass, and 2 ill-defined hepatic hypoenhancing lesions which appears to correspond to the lesion seen on the prior MRI in 2015.      02/05/2015 Imaging    abdomen MRI showed 2 hemangioma, no suspicion for metastatic disease. CT chest was negative      02/20/2015 - 03/30/2015 Radiation Therapy    neoadjuvant RT to rectal cancer       02/20/2015 Concurrent Chemotherapy    capecitabine 2500 mg in the morning and 2000 mg in the  evening (877m/m2, bid), on the day of radiation.       05/25/2015 Surgery    Recto-sigmoid segmental resection, margins are negative       05/25/2015 Pathology Results    0.2cm residual invasive adenocarcinoma, G2, negative margins, 12 nodes were negative       05/25/2015 Cancer Staging    Staging form: Colon and Rectum, AJCC 7th Edition - Pathologic stage from 05/25/2015: Stage I (T1, N0, cM0) - Signed by FTruitt Merle MD on 12/15/2017      06/23/2015 - 06/29/2015 Hospital Admission    Patient was admitted for pelvic abscess, drain placed and she was treated with IV antibiotics, she also received 1 dose of Feraheme for anemia.      09/15/2015 - 09/17/2015 Hospital Admission    Recurrent pelvic collection s/p perc drainage 09/16/2015      02/21/2016 Imaging    CT abdomen and pelvis w contrast IMPRESSION: 1. Interval removal of surgical drains since 12/19/2015 from the presacral space. Similar size of ill-defined presacral fluid and gas, for which residual abscess cannot be excluded. Similar amount of intraperitoneal edema throughout the upper pelvis with foci of extraluminal gas, possibly related to the presacral process. 2. Diverting right-sided ileostomy, without acute complication. 3. Similar moderate hydroureteronephrosis, likely due to the pelvic process. 4. Possible bladder wall thickening. Correlate with urinalysis. This appearance  could be partially due to underdistention. 5. Geographic hepatic steatosis and hemangiomas. Cannot exclude a new right hepatic lobe lesion. Consider nonemergent pre and post contrast outpatient MRI. This could represent a new area of focal steatosis.      05/31/2016 Surgery    ileostomy takedown BY Dr. Johney Maine       07/04/2016 Imaging    CT Abdomen Pelvis W Contrast 07/04/16 IMPRESSION: Subcutaneous fluid collection at the site of ileostomy takedown, could be sterile or infected, measuring 9.3 x 4.3 x 5.3 cm; this could be aspirated under  ultrasound guidance if clinically indicated. Large collection of stool within the presacral space, could potentially be within a distended distorted rectum but since this occupies the same position as the abscess collection identified on the previous CT this is suspicious for a contiguous contained extraluminal stool collection communicating with the rectosigmoid colon, collection overall roughly 7 x 8 x 9 cm.      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      12/26/2016 Imaging    IMPRESSION: 1. Stable CT exam of the chest.  No evidence for metastatic disease. 2. Interval left colectomy with left abdominal end colostomy. Presacral soft tissue is similar to prior study when taking into account the interval surgery. 3. Hepatic dome lesion seen on the previous study not evident today, potentially related to bolus timing. The 2.4 cm subcapsular inferior right liver lesion remains subtle and is barely visible on today's exam but shows no substantial interval change in size. Continued attention on follow-up recommended.       12/09/2017 Imaging    CT CAP W Contrast 12/09/17  IMPRESSION: 1. Stable post treatment changes in the presacral space with no evidence of recurrent disease in the pelvis. 2. No evidence of metastatic disease in the chest, abdomen or pelvis. 3. Large parastomal hernia has increased in size and contains much of the remnant left colon. No evidence of bowel obstruction or ischemia. 4. Stable dilated main pulmonary artery, suggesting chronic pulmonary arterial hypertension. 5.  Aortic Atherosclerosis (ICD10-I70.0).      12/31/2017 Genetic Testing    The Multi-Cancer Panel offered by [facility] includes sequencing and/or deletion duplication testing of the following 83 genes: ALK, APC, ATM, AXIN2,BAP1,  BARD1, BLM, BMPR1A, BRCA1, BRCA2, BRIP1, CASR, CDC73, CDH1, CDK4, CDKN1B, CDKN1C, CDKN2A  (p14ARF), CDKN2A (p16INK4a), CEBPA, CHEK2, CTNNA1, DICER1, DIS3L2, EGFR (c.2369C>T, p.Thr790Met variant only), EPCAM (Deletion/duplication testing only), FH, FLCN, GATA2, GPC3, GREM1 (Promoter region deletion/duplication testing only), HOXB13 (c.251G>A, p.Gly84Glu), HRAS, KIT, MAX, MEN1, MET, MITF (c.952G>A, p.Glu318Lys variant only), MLH1, MSH2, MSH3, MSH6, MUTYH, NBN, NF1, NF2, NTHL1, PALB2, PDGFRA, PHOX2B, PMS2, POLD1, POLE, POT1, PRKAR1A, PTCH1, PTEN, RAD50, RAD51C, RAD51D, RB1, RECQL4, RET, RUNX1, SDHAF2, SDHA (sequence changes only), SDHB, SDHC, SDHD, SMAD4, SMARCA4, SMARCB1, SMARCE1, STK11, SUFU, TERC, TERT, TMEM127, TP53, TSC1, TSC2, VHL, WRN and WT1.   Results: Negative, no pathogenic variants identified.  The date of this test report is 12/31/2017.         FAMILY HISTORY:  We obtained a detailed, 4-generation family history.  Significant diagnoses are listed below: Family History  Problem Relation Age of Onset  . Coronary artery disease Mother 52  . Hypertension Mother   . Hyperlipidemia Mother   . Diabetes Mellitus I Mother   . Coronary artery disease Father   . Hyperlipidemia Father   . Hypertension Father   . Cancer Sister  skin - non melanoma  . Hyperlipidemia Brother   . Cancer Maternal Uncle 75       pancreatic with mets to colon and prostate  . Cirrhosis Maternal Uncle   . Hypertension Maternal Grandmother   . Diabetes Mellitus I Maternal Grandmother   . Hyperlipidemia Maternal Grandmother   . CVA Maternal Grandmother   . Hypertension Maternal Grandfather   . Coronary artery disease Maternal Grandfather 65  . Hyperlipidemia Maternal Grandfather   . Coronary artery disease Paternal Grandmother   . Hypertension Paternal Grandmother   . Hyperlipidemia Paternal Grandmother   . Diabetes Mellitus I Paternal Grandmother   . Hypertension Paternal Grandfather   . Hyperlipidemia Paternal Grandfather   . Coronary artery disease Paternal Grandfather     Lindsay Little  has 2 adopted children, no biological children. Lindsay Little has 1 brother who is 68 with no history of cancer.  He had a colonoscopy when she was diagnosed a couple of years ago that as far as she knows was normal.  He has 3 children.  Lindsay Little has a 40 year-old sister who has a history of skin cancer (not melanoma).  She had a colonoscopy and had no polyps identified.  She has a history of severe fibroids and had a hysterectomy due to this issue.  She has 3 children.   Lindsay Little's father: died at 19 due to heart disease/CAD.  Paternal Aunts/Uncles: 1 paternal uncle in his 47's- unk/limited info. 1 paternal aunt with no known history of cancer.   Paternal cousins: no history of cancer.  Paternal grandfather: died at 57 due to heart disease.  Paternal grandmother:died in her 72's due to heart disease.   Lindsay Little's mother: 84, no history of cancer.  Has heart disease.  She had a colonoscopy in the past few years that was normal.  She has a history of uterine fibroids and had a hysterectomy.   Maternal Aunts/Uncles:3 paternal uncles.  1 was diagnosed with pancreatic cancer at 18 and died at 6 after his disease metastasized.  He had no history of smoking, and had a history of diabetes.   Maternal cousins: 1 maternal cousin was diagnosed with breast cancer >50.  No other cousins with known history of cancer.  Maternal grandfather: died in his 77's with no history of cancer.  He had heart disease.  Maternal grandmother:died in her 57's.  She had Alzheimer's disease and might have had cancer, but no work-up was pursued to confirm this.   Lindsay Little is unaware of previous family history of genetic testing for hereditary cancer risks. Patient's maternal ancestors are of [place] descent, and paternal ancestors are of [place] descent. There is no reported Ashkenazi Jewish ancestry. There is no known consanguinity.  GENETIC TEST RESULTS: Genetic testing performed through [facility]'s Multi-Cancer Panel  reported out on 12/31/217 showed no pathogenic mutations. The Multi-Cancer Panel offered by [facility] includes sequencing and/or deletion duplication testing of the following 83 genes: ALK, APC, ATM, AXIN2,BAP1,  BARD1, BLM, BMPR1A, BRCA1, BRCA2, BRIP1, CASR, CDC73, CDH1, CDK4, CDKN1B, CDKN1C, CDKN2A (p14ARF), CDKN2A (p16INK4a), CEBPA, CHEK2, CTNNA1, DICER1, DIS3L2, EGFR (c.2369C>T, p.Thr790Met variant only), EPCAM (Deletion/duplication testing only), FH, FLCN, GATA2, GPC3, GREM1 (Promoter region deletion/duplication testing only), HOXB13 (c.251G>A, p.Gly84Glu), HRAS, KIT, MAX, MEN1, MET, MITF (c.952G>A, p.Glu318Lys variant only), MLH1, MSH2, MSH3, MSH6, MUTYH, NBN, NF1, NF2, NTHL1, PALB2, PDGFRA, PHOX2B, PMS2, POLD1, POLE, POT1, PRKAR1A, PTCH1, PTEN, RAD50, RAD51C, RAD51D, RB1, RECQL4, RET, RUNX1, SDHAF2, SDHA (sequence changes only), SDHB, SDHC, SDHD, SMAD4,  SMARCA4, SMARCB1, SMARCE1, STK11, SUFU, TERC, TERT, TMEM127, TP53, TSC1, TSC2, VHL, WRN and WT1. .  The test report will be scanned into EPIC and will be located under the Molecular Pathology section of the Results Review tab. A portion of the result report is included below for reference.     We discussed with Lindsay Little that because current genetic testing is not perfect, it is possible there may be a gene mutation in one of these genes that current testing cannot detect, but that chance is small.  We also discussed, that there could be another gene that has not yet been discovered, or that we have not yet tested, that is responsible for the cancer diagnoses in the family. It is also possible there is a hereditary cause for the cancer in the family that Lindsay Little did not inherit and therefore was not identified in her testing.  Therefore, it is important to remain in touch with cancer genetics in the future so that we can continue to offer Lindsay Little the most up to date genetic testing.   ADDITIONAL GENETIC TESTING: We discussed with Lindsay Little  that her genetic testing was fairly extensive.  If there are are genes identified to increase cancer risk that can be analyzed in the future, we would be happy to discuss and coordinate this testing at that time.    CANCER SCREENING RECOMMENDATIONS: This negative result means that we were unable to identify a hereditary cause for her personal and family history of cancer at this time.    While reassuring, this does not definitively rule out a hereditary predisposition to cancer. It is still possible that there could be genetic mutations that are undetectable by current technology, or genetic mutations in genes that have not been tested or identified to increase cancer risk.  Therefore, it is recommended she continue to follow the cancer management and screening guidelines provided by her oncology and primary healthcare provider. An individual's cancer risk is not determined by genetic test results alone.  Overall cancer risk assessment includes additional factors such as personal medical history, family history, etc.  These should be used to make a personalized plan for cancer prevention and surveillance.    RECOMMENDATIONS FOR FAMILY MEMBERS:  Relatives in this family might be at some increased risk of developing cancer, over the general population risk, simply due to the family history of cancer.  We recommended women in this family have a yearly mammogram beginning at age 101, or 67 years younger than the earliest onset of cancer, an annual clinical breast exam, and perform monthly breast self-exams. Women in this family should also have a gynecological exam as recommended by their primary provider. All family members should have a colonoscopy by age 54 (or as directed by their doctors).  All family members should inform their physicians about the family history of cancer so their doctors can make the most appropriate screening recommendations for them.     We recommend colonoscopies every 5 years  starting by age 59 for her siblings/parents. (or as directed by their doctors).  It is also possible there is a hereditary cause for the cancer in Lindsay Little's family that she did not inherit and therefore was not identified in her.  We recommended her maternal relatives (especially those affected with cancer), have genetic counseling and testing. Lindsay Little will let us know if we can be of any assistance in coordinating genetic counseling and/or testing for these family members.   FOLLOW-UP: Lastly,  we discussed with Lindsay Little that cancer genetics is a rapidly advancing field and it is possible that new genetic tests will be appropriate for her and/or her family members in the future. We encouraged her to remain in contact with cancer genetics on an annual basis so we can update her personal and family histories and let her know of advances in cancer genetics that may benefit this family.   Our contact number was provided. Lindsay Little's questions were answered to her satisfaction, and she knows she is welcome to call us at anytime with additional questions or concerns.   Ferol Luz, MS, [facility] Certified Genetic Counselor [email or web address]

## 2018-01-12 NOTE — H&P (Signed)
Delfino Lovett Documented: 01/12/2018 9:05 AM Location: [facility] Patient #: [id number] DOB: 1966/06/13 Married / Language: [name] / Race: White Female  History of Present Illness [name] MD; 01/12/2018 1:49 PM) The patient is a 52 year old female who presents with colorectal cancer. Note for "Colorectal cancer": Rectal adenocarcinoma s/p LAR resection 05/25/2015 Colovaginal fistula s/p omentopexy repair 07/12/2016 Colorectal delayed anastomotic leak s/p resection & colostomy 07/12/2016  Patient returns with her husband. Patient notes increasing bulging around her left upper quadrant colostomy site. Underwent recent CAT scan for her rectal cancer. Showed worsening parastomal hernia. Not a large defect but more of her colon going up into it. No definite small bowel. No evidence of any cancer recurrence. A little gas down the pelvis but certainly a smaller sinus. Patient still get some drainage out her vagina. Usually has to change her pad about twice a day. Yellow. Not typically found. Gynecology. Has been given rounds of Diflucan and clindamycin without resolution. 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 This helped somewhat but still struggles. No fevers or chills. She's been intentionally losing weight. Trying to be more physically active but then has struggled with some low back pain and gluteal pain radiating to her legs. No nausea or vomiting. No fissure or chills.   Patient ID: CARREN BLAKLEY MRN: [id number] DOB/AGE: November 08, 1965 52 y.o.  Admit date: 07/10/2016 Discharge date:  Patient Care Team: [name], MD as PCP - General (Family Medicine) Michael Boston, MD as Consulting Physician (General Surgery) Truitt Merle, MD as Consulting Physician (Medical Oncology) Kyung Rudd, MD as Consulting Physician (Radiation Oncology) Teena Irani, MD as Consulting  Physician (Gastroenterology) Raynelle Bring, MD as Consulting Physician (Urology)  Discharge Diagnoses: Principal Problem: Colorectal delayed anastomotic leak s/p resection & colostomy 07/12/2016 Active Problems: Rectal adenocarcinoma s/p LAR resection 05/25/2015 Morbid obesity with BMI of 40.0-44.9, adult ([place]) Colovaginal fistula s/p omentopexy repair 07/12/2016 Rectocutaneous fistula Colostomy in place ([place])   POST-OPERATIVE DIAGNOSIS: COLORECTAL ANASTOMOTIC BREAKDOWN WITH COLOVAGINAL FISTULA  PROCEDURE:  LAPAROSCOPIC LYSIS OF ADHESIONS LAPAROSCOPIC RESECTION OF COLORECTAL ANASTOMOSIS END COLOSTOMY OVERSEW OF RECTAL STUMP OMENTOPEXY OF RECTAL STUMP & VAGINAL CUFF  SURGEON: [name], MD  ASSISTANT: RN  ANESTHESIA: local and general  EBL: Total I/O In: 2400 [I.V.:2400] Out: 14 [Urine:260; Blood:200]  Delay start of Pharmacological VTE agent (>24hrs) due to surgical blood loss or risk of bleeding: no  DRAINS: 19 Fr drain with tip in the pelvis  SPECIMEN: PROXIMAL & DISTAL COLORECTAL ANASTOMOSIS  DISPOSITION OF SPECIMEN: PATHOLOGY  COUNTS: YES  PLAN OF CARE: Admit to inpatient  PATIENT DISPOSITION: PACU - hemodynamically stable.  INDICATION:   Morbidly obese female with bulky rectal cancer status post neoadjuvant chemoradiation therapy and robotically assisted resection. Developed delayed hematoma and abscess. Turned in a chronic fistula. Required loop ileostomy diversion and omental pedicle flap. Area close down. No leak by colonoscopy and enema. Underwent ileostomy takedown without incident. Five weeks later he noted worsening pain and drainage of vagina suspicious for: Bowel fistula. CT scan showing anastomotic breakdown. I recommended endoscopic assisted resection of colorectal anastomosis with permanent end colostomy.  The anatomy & physiology of the digestive tract was discussed. The pathophysiology was discussed. Natural  history risks without surgery was discussed. I worked to give an overview of the disease and the frequent need to have multispecialty involvement. I feel the risks of no intervention will lead to serious problems  that outweigh the operative risks; therefore, I recommended a partial colectomy to remove the pathology. Laparoscopic & open techniques were discussed.  Risks such as bleeding, infection, abscess, leak, reoperation, possible ostomy, hernia, heart attack, death, and other risks were discussed. I noted a good likelihood this will help address the problem. Goals of post-operative recovery were discussed as well. We will work to minimize complications. Educational materials on the pathology had been given in the office. Questions were answered.   The patient expressed understanding & wished to proceed with surgery.  OR FINDINGS:  Patient had stool resting on her coccyx in the very distal posterior and right lateral aspects. Breakdown of the anastomosis seen to be more posteriorly.  Remaining rectal stump seemed healthy and noninflamed. About 4 cm stump remains oversewn with the LOC.  No definite evidence of colovaginal fistula breakdown. Nonetheless omentum used as an omentopexy of her rectal stump and vaginal.  End colostomy in left upper quadrant along subcostal space along premarked region.  No obvious metastatic disease on visceral parietal peritoneum or liver.      ADDENDUM REPORT: 07/04/2016 18:48 ADDENDUM: Discussed additional history with Dr. Johney Maine: Patient had a prior leak which was passed with omentum. Postoperative contrast enema showed no contrast extravasation. An enema tip was inserted into the rectum and a small amount of water-soluble contrast was instilled retrograde. Repeat CT imaging of the pelvis was then performed. Rectal contrast outlines the stool in the sigmoid colon and rectum, including the large collection of stool in the presacral space.  Multiplanar images show that the stool is present within a large patulous rectosigmoid colon without definite contrast extravasation. This does not appear to be a separate contiguous collection but a distended rectosigmoid segment. Also identified is presence of contrast material within the vagina compatible rectovaginal fistula, question arising at the RIGHT superolateral aspect of the vaginal cuff. Additionally, a linear collection of contrast material is seen extending posteriorly from the rectum RIGHT of the sacrum, extending into the medial aspect of the RIGHT gluteus maximus, consistent with an additional fistulous track. Infiltrative changes extend dorsally from the site on axial images into the subcutaneous fat. Electronically Signed By: Lavonia Dana M.D. On: 07/04/2016 18:48 Addended by Lavonia Dana, MD on 07/04/2016 6:50 PM  Study Result CLINICAL DATA: Colon cancer post resection, chemotherapy and radiation therapy, possible fistula EXAM: CT ABDOMEN AND PELVIS WITH CONTRAST TECHNIQUE: Multidetector CT imaging of the abdomen and pelvis was performed using the standard protocol following bolus administration of intravenous contrast. CONTRAST: 39m ISOVUE-300 IOPAMIDOL (ISOVUE-300) INJECTION 61% PO, 1075mISOVUE-300 IOPAMIDOL (ISOVUE-300) INJECTION 61% IV COMPARISON: 02/21/2016 FINDINGS: Lower chest: Lung bases clear Hepatobiliary: Poorly defined area of low-attenuation RIGHT lobe of liver centrally image 17 measuring 2.1 x 1.8 cm. Additional area of poorly defined low-attenuation lateral aspect RIGHT lobe of liver more inferiorly, 2.3 x 1.6 cm image 29. These lesions have not significantly changed in size since the previous exam. Pancreas: Normal appearance Spleen: Normal appearance Adrenals/Urinary Tract: Adrenal glands normal appearance. Kidneys and ureters normal appearance. RIGHT hydronephrosis and hydroureter seen on previous exam resolved. Bladder wall appears mildly  thickened but bladder is underdistended question artifact. Stomach/Bowel: Stomach normal appearance. Small bowel loops nondistended without wall thickening. Increased stool throughout colon. Interval takedown of ostomy in RIGHT upper quadrant question loop ileostomy. Staple line at enterotomy closure in the RIGHT mid abdomen. Large subcutaneous fluid collection at site of ostomy take-down, 9.3 x 4.3 x 5.3 cm, may represent a sterile or infected collection. Mild  overlying skin thickening and minimal surrounding infiltrative changes. Rectum distended by stool with mild thickening of the wall with adjacent infiltration of presacral and perirectal fat, question related to radiation therapy and prior surgery. A large collection of stool and gas is seen within the presacral space. This appears to be predominantly cranial to the anastomotic suture line more inferiorly. This roughly occupies the same location as the gas and fluid collection identified on the prior CT. While this could represent stool within a distended rectum, the fact that this courses cranially in the same distribution as the previous abscess collection suggests this may be stool extraluminal to the rectosigmoid colon within a contiguous contained collection. This overall measures approximately 8 cm transverse by 7 cm AP and extends 9 cm length. Vascular/Lymphatic: Multiple normal sized para-aortic and LEFT common iliac nodes. No additional abdominal or pelvic adenopathy. Aorta normal caliber. 7 mm LEFT external iliac node image 86 unchanged. Multiple small nodules in the fat adjacent to the rectosigmoid colon axial images 73, 74, 78 question tiny lymph nodes, largest 8 mm short axis coronal image 62, minimally increased. Reproductive: Post hysterectomy. RIGHT ovary not visualized. Suspect visualized LEFT ovary. Air within vagina. Other: No free air or free fluid. No hernia. Musculoskeletal: Osseous structures unremarkable. IMPRESSION:  Subcutaneous fluid collection at the site of ileostomy takedown, could be sterile or infected, measuring 9.3 x 4.3 x 5.3 cm; this could be aspirated under ultrasound guidance if clinically indicated. Large collection of stool within the presacral space, could potentially be within a distended distorted rectum but since this occupies the same position as the abscess collection identified on the previous CT this is suspicious for a contiguous contained extraluminal stool collection communicating with the rectosigmoid colon, collection overall roughly 7 x 8 x 9 cm. Electronically Signed: By: Lavonia Dana M.D. On: 07/04/2016 16:58  Result History  CT ABDOMEN PELVIS W CONTRAST (Order #[id number]) on 07/04/2016 - Order Result History Report - Result Edited <[email or web address]> Result Notes  Notes Recorded by Michael Boston, MD on 07/05/2016 at 7:44 AM EST Called and discussed with patient. She is eating okay. Intermittent diarrhea controlled with Imodium. I suspect its more overflow diarrhea around constantly chronically constipated rectum. While she is severely disappointed that the anastomosis is again broken down and she needs a colostomy, she would really like to wait until after the holiday season before planning surgery, especially since she struggled recovering last year. She is having some drainage of the vagina but it is not severe and it is manageable. She has some soreness but she does not have severely debilitating pelvic pain. She is not need narcotics around the clock and when she has works. She is ruling well on it though. She will keep her appointment with me next week to touch base. I will set up a time in the next couple weeks to do a laparoscopic assisted takedown anastomosis and colostomy. She needs more pain medications. I will ask my partner to fill an Rx. #[id number] ------ Notes Recorded by Michael Boston, MD on 07/05/2016 at 6:52 AM EST Patient completed ordered test/studies.  Results noted below. Patient with leak of contrast to vagina suspecting for delayed colovaginal fistula. Most likely at the apex. I think her rectum has failed her again. I think she requires resection and takedown of her distal colorectal anastomosis with permanent colostomy. Despite the drains, surgeries, fecal diversion, omental pedicle flap and a normal colonoscopy and normal enema, the area has broken down again. I think that her neorectum  refuses to adequately heal despite the numerous surgeries and interventions. I will call & discuss with the patient [name], M.D., F.A.C.S. Gastrointestinal and Minimally Invasive Surgery [facility] [address] [place], [place] [address] [phone] Main / Paging    Patient ID: TRISA CRANOR MRN: [id number] DOB/AGE: 11-02-1965 52 y.o.  Admit date: 05/31/2016 Discharge date: 06/03/2016  Patient Care Team: [name], MD as PCP - General (Family Medicine) Michael Boston, MD as Consulting Physician (General Surgery) Truitt Merle, MD as Consulting Physician ([place]) Kyung Rudd, MD as Consulting Physician (Radiation Oncology) Teena Irani, MD as Consulting Physician (Gastroenterology) Raynelle Bring, MD as Consulting Physician (Urology)  Admission Diagnoses: Principal Problem: Colorectal delayed anastomotic leak - healed, s/p ileostomy takedown 05/31/2016 Active Problems: Rectal adenocarcinoma s/p LAR resection 05/25/2015 Morbid obesity with BMI of 40.0-44.9, adult ([place]) Crohn's disease ([place]) Hypertension IBS (irritable bowel syndrome) Depression Pelvic abscess s/p drainage & omental pedicle flap 11/17/2015   Discharge Diagnoses: Principal Problem: Colorectal delayed anastomotic leak - healed, s/p ileostomy takedown 05/31/2016 Active Problems: Rectal adenocarcinoma s/p LAR resection 05/25/2015 Morbid obesity with BMI of 40.0-44.9, adult ([place]) Crohn's disease  ([place]) Hypertension IBS (irritable bowel syndrome) Depression Pelvic abscess s/p drainage & omental pedicle flap 11/17/2015   POST-OPERATIVE DIAGNOSIS:  loop ileostomy in place for fecal diversion  SURGERY: 05/31/2016  Procedure(s): TAKEDOWN LOOP ILEOSTOMY  SURGEON:   Surgeon(s): Michael Boston, MD  INDICATION: Pleasant patient status post low anterior resection for bulky rectal cancer. Developed collection that turned and abscess with delayed anastomotic leak and fistula. Eventually required fecal diversion. Had reoperation with omental pedicle flap. Biopsies disprove cancer recurrence. Anastomosis closed and healed. Colonoscopy two days ago showing omentum granulating at colorectal anastomosis otherwise no evidence of cancer recurrence or other cancers. The patient has recovered from that surgery with the anastomosis well-healed. It was felt safe to have the loop ileostomy taken down. I discussed the procedure with the patient:  The anatomy & physiology of the digestive tract was discussed. The pathophysiology was discussed. Possibility of remaining with an ostomy permanently was discussed. I offered ostomy takedown. Laparoscopic & open techniques were discussed.  Risks such as bleeding, infection, abscess, leak, reoperation, possible re-ostomy, injury to other organs, hernia, heart attack, death, and other risks were discussed. I noted a good likelihood this will help address the problem. Goals of post-operative recovery were discussed as well. We will work to minimize complications. Questions were answered. The patient expresses understanding & wishes to proceed with surgery.  OR FINDINGS: Thick abdominal wall with dense adhesions to the diverting loop ileostomy but no parastomal hernia. Normal anatomy.   Problem List/Past Medical [name], MD; 01/12/2018 9:21 AM) RECTAL ADENOCARCINOMA (C20) INCISIONAL HERNIA, INCARCERATED (K43.0) URINARY FREQUENCY  (R35.0) LARGE BOWEL ANASTOMOTIC LEAK (K91.89) ENCOUNTER FOR PREOPERATIVE EXAMINATION FOR GENERAL SURGICAL PROCEDURE (Z01.818) ILEOSTOMY IN PLACE (Z93.2) HYPOKALEMIA (E87.6) THROMBOSIS DUE TO CENTRAL VENOUS ACCESS DEVICE, INITIAL ENCOUNTER (O67.672C) S/P COLON RESECTION - 1st postop visit (Z90.49) COLOVAGINAL FISTULA (N82.4) PERIRECTAL FISTULA (K60.4) VISIT FOR WOUND CHECK (Z51.89) COLOSTOMY IN PLACE (Z93.3) CHRONIC PELVIC PAIN IN FEMALE (R10.2) PORT CATHETER IN PLACE (N47.096) PERITONEAL-VAGINAL FISTULA (N82.8) PERITONEAL FISTULA (K66.9)  Past Surgical History [name], MD; 01/12/2018 9:21 AM) Colon Polyp Removal - Colonoscopy Hysterectomy (not due to cancer) - Partial Knee Surgery Left. Oral Surgery Shoulder Surgery Left.  Diagnostic Studies History [name], MD; 01/12/2018 9:21 AM) Colonoscopy >10 years ago Mammogram 1-3 years ago Pap Smear 1-5 years  ago  Allergies (Tanisha A. Owens Shark, RMA; 01/12/2018 9:06 AM) Lidocaine *CHEMICALS* All of the Caine's except MARCAINE is ok per the pt Penicillin G Benzathine & Proc *PENICILLINS* Sulfabenzamide *CHEMICALS* Allergies Reconciled  Medication History (Tanisha A. Owens Shark, [place]; 01/12/2018 9:07 AM) Gabapentin (300MG Tablet, Oral) Active. Melatonin (3MG Capsule, Oral) Active. Coenzyme Q10 (100MG Capsule, Oral) Active. DULoxetine HCl (60MG Capsule DR Part, Oral) Active. Hyoscyamine Sulfate ER (0.375MG Tablet ER 12HR, Oral) Active. Furosemide (20MG Tablet, Oral) Active. Hydrochlorothiazide (25MG Tablet, Oral) Active. Meloxicam (15MG Tablet, Oral) Active. Crestor (5MG Tablet, Oral) Active. Bystolic (16RC Tablet, Oral) Active. Robaxin (500MG Tablet, Oral) Active. Vitamin D (1000UNIT Tablet, Oral) Active. Medications Reconciled  Pregnancy / Birth History [name], MD; 01/12/2018 9:21 AM) Age at menarche 37 years. Age of menopause 15-50 Gravida 4 Irregular periods Maternal  age 64-20 Para 0  Other Problems [name], MD; 01/12/2018 9:21 AM) Back Pain Diverticulosis Gastroesophageal Reflux Disease General anesthesia - complications High blood pressure Hypercholesterolemia Other disease, cancer, significant illness Rectal Cancer Transfusion history    Vitals (Tanisha A. Brown RMA; 01/12/2018 9:06 AM) 01/12/2018 9:05 AM Weight: 321.4 lb Height: 70.5in Body Surface Area: 2.57 m Body Mass Index: 45.46 kg/m  Temp.: 57F  Pulse: 84 (Regular)  BP: 132/84 (Sitting, Left Arm, Standard)      Physical Exam [name] MD; 01/12/2018 9:50 AM)  General Mental Status-Alert. General Appearance-Not in acute distress. Voice-Normal. Note: Smiling. Moves around easily. No nervous leg shaking  Integumentary Global Assessment Normal Exam - Distribution of scalp and body hair is normal. General Characteristics Overall Skin Surface - no rashes and no suspicious lesions.  Head and Neck Head-normocephalic, atraumatic with no lesions or palpable masses. Face Global Assessment - atraumatic, no absence of expression. Neck Global Assessment - no abnormal movements, no decreased range of motion. Trachea-midline. Thyroid Gland Characteristics - non-tender.  Eye Eyeball - Left-Extraocular movements intact, No Nystagmus. Eyeball - Right-Extraocular movements intact, No Nystagmus. Upper Eyelid - Left-No Cyanotic. Upper Eyelid - Right-No Cyanotic. Note: Wears glasses. Vision acceptable  Chest and Lung Exam Inspection Accessory muscles - No use of accessory muscles in breathing. Note: R infraclavilcuar port clean without cellulitis  Abdomen Note: Morbidly obese but soft with clean panniculus. No rash   Left upper quadrant colostomy with intact bag and no leakage or drainage. Nice rosebud colostomy.  Other port site incisions with normal healing ridges. No guarding/rebound tenderness. Really no  pain. No hernias. No drainage.  Female Genitourinary Note: Thick yellow drainage around the introitus. Vaginal cuff smooth and contracted to about 5 cm at the apex. Sensitive but no major pain. Suspicious for persistent fistula to peritoneal cul-de-sac.  Rectal Note: Perianal skin clear. Small anal tag. Normal sphincter tone. Digital exam goes up into the presacral space consistent with a completely disrupted rectal cuff. I can feel smooth fat anteriorly consistent with her omental flap. 4x3x2 cm presacral cavity/rectal cuff region. More narrow and smaller Closed off. Sensitive but not friable. Cannot feel a definite connection to the posterior vaginal cuff. No hard mass or ulceration. No evidence of cancer recurrence.  Peripheral Vascular Upper Extremity Inspection - Left - Not Gangrenous, No Petechiae. Right - Not Gangrenous, No Petechiae.  Neurologic Neurologic evaluation reveals -normal attention span and ability to concentrate, able to name objects and repeat phrases. Appropriate fund of knowledge and normal coordination.  Neuropsychiatric Mental status exam performed with findings of-able to articulate well with normal speech/language, rate, volume and coherence and no evidence of hallucinations,  delusions, obsessions or homicidal/suicidal ideation. Orientation-oriented X3. Note: Smiling.  Musculoskeletal Global Assessment Gait and Station - normal gait and station.  Lymphatic General Lymphatics Description - No Generalized lymphadenopathy.    Assessment & Plan [name] MD; 01/12/2018 1:50 PM)  PARASTOMAL HERNIA WITHOUT OBSTRUCTION OR GANGRENE (K43.5)   PREOP - [place] - ENCOUNTER FOR PREOPERATIVE EXAMINATION FOR GENERAL SURGICAL PROCEDURE (Z01.818)  Current Plans You are being scheduled for surgery- Our schedulers will call you.  You should hear from our office's scheduling department within 5 working days about the location, date, and time of  surgery. We try to make accommodations for patient's preferences in scheduling surgery, but sometimes the OR schedule or the surgeon's schedule prevents [place] from making those accommodations.  If you have not heard from our office [phone]) in 5 working days, call the office and ask for your surgeon's nurse.  If you have other questions about your diagnosis, plan, or surgery, call the office and ask for your surgeon's nurse.  Written instructions provided The anatomy & physiology of the abdominal wall was discussed. The pathophysiology of hernias was discussed. Natural history risks without surgery including progeressive enlargement, pain, incarceration, & strangulation was discussed. Contributors to complications such as smoking, obesity, diabetes, prior surgery, etc were discussed.  I feel the risks of no intervention will lead to serious problems that outweigh the operative risks; therefore, I recommended surgery to reduce and repair the hernia. I explained laparoscopic techniques with possible need for an open approach. I noted the probable use of mesh to patch and/or buttress the hernia repair  Risks such as bleeding, infection, abscess, need for further treatment, heart attack, death, and other risks were discussed. I noted a good likelihood this will help address the problem. Goals of post-operative recovery were discussed as well. Possibility that this will not correct all symptoms was explained. I stressed the importance of low-impact activity, aggressive pain control, avoiding constipation, & not pushing through pain to minimize risk of post-operative chronic pain or injury. Possibility of reherniation especially with smoking, obesity, diabetes, immunosuppression, and other health conditions was discussed. We will work to minimize complications.  An educational handout further explaining the pathology & treatment options was given as well. Questions were answered. The  patient expresses understanding & wishes to proceed with surgery.  Pt Education - [facility] Hernia Post-Op HCI (Nehemyah Foushee): discussed with patient and provided information. Pt Education - [facility] Pain Control (Martisha Toulouse) Pt Education - Pamphlet Given - Laparoscopic Hernia Repair: discussed with patient and provided information. Pt Education - [facility] Mesh education: discussed with patient and provided information.  PERITONEAL-VAGINAL FISTULA (N82.8) Impression: Pinhole fistula at the apex of her vaginal cuff going up to the peritoneum. Low output but not resolved.  I suspect she is getting peritoneal fluid in the cul-de-sac that intermittently drains at the vagina. Changes pad twice a day. There is been no proof that there is actual infection and no improvement with trials of Diflucan and clindamycin. I'm skeptical that it will taper off more. By 2 years, that should be as good as it gets. Could try more formal closure of it with washout debridement and closure of the sphincter with perhaps temporary drainage, but I am guarded against doing that though. I will discuss with my colorectal partners  Current Plans Return to clinic as needed.  Soreness, decreased appetite, and poor energy level are common problems after surgery. While many people can struggle with a bad day, these concerns should gradually fade away or  at least improve. Much of your recovery depends on your health & the severity of your operation. Please call if you have any further questions / concerns related to surgery.  Increase activity as tolerated to regular everyday activity. Consider daily low impact exercise every day such as walking an hour a day.  Do not push through pain. If it hurts to do it, then don't do it.  Diet as tolerated. Low fat high fiber diet ideal. 30 g fiber a day ideal. Consider taking a daily fiber supplement to keep your bowels regular.  Followup with your primary care physician for other health issues as would  normally be done.  Consider screening for malignancies (breast, prostate, colon, melanoma, etc) as appropriate. Discuss with you primary care physician.  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 Smaller cavity now about 5 x 3 x 1 cm. Definitely smaller - about half the size it used to be. Collapsed with omentum in it. No evidence of any cancer recurrence by palpation Hopefully it will gradually taper down most likely will be a persistent source of chronic drainage as well. Can try and do a PVR of rectal stump and maybe oversew vaginal cuff, but I'm guarded that that will work. We'll discuss with my colorectal partners to see if they would do it in conjunction or try and hold off.   CHRONIC PELVIC PAIN IN FEMALE (R10.2) Impression: Intermittent intense flares but not a chronic problem. There is still time for things to calm down.  I strongly recommend she establish with a chronic pain specialist to help manage some of these issues especially with recurrent flares. She will discuss with her primary care physician and establish. She was just started on some gabapentin. She is only on 200 mg twice a day. There is plenty of room to go up. I recommend she take double dose at bedtime and consider going up until her pain is under better control. Continue meloxicam. Continue Robaxin as needed.  I do not want to give her any more narcotics. I have given her "one more prescription" many times in the past 6 months. I think she needs a second opinion with a chronic pain specialist.  Continue use vaginal and perineal stretching with pelvic floor physical therapist.   RECTAL ADENOCARCINOMA (C20) Impression: Complicated recovery due to late abscess and contained anastomotic leak with some help of percutaneous drainage and antibiotics.  No evidence of cancer recurrence by vaginal, rectal and CT  evaluations  Continue follow-up appointment with her medical oncologist. Dr. Burr Medico just saw her. CT scan evaluation last week reassuring for no distant disease. No new pelvic collection or abscess or recurrence. Follow-up chemotherapy   COLOSTOMY IN PLACE (Z93.3)  Current Plans Pt Education - [facility] Ostomy HCI (Gentri Guardado): discussed with patient and provided information.  [name], MD, FACS, MASCRS Gastrointestinal and Minimally Invasive Surgery    [address] [place], [place] [address] [phone] Main / Paging [phone] Fax

## 2018-01-12 NOTE — Telephone Encounter (Signed)
Left message for pt to have surgeon fax clearance request including type and date of procedure, type of anesthesia and if any medications should be held,  to our office so that it can be addressed appropriately.  Also advised that unless the surgeon is requesting cardiac clearance she may not need this since she does not have a history of CAD.  Requested she c/b to discuss further.

## 2018-01-14 ENCOUNTER — Telehealth: Payer: Self-pay | Admitting: Cardiology

## 2018-01-14 NOTE — Telephone Encounter (Signed)
° °  [facility] Pre-operative Risk Assessment    Request for surgical clearance:  1. What type of surgery is being performed? L4-5 Lumbar Fusion  2. When is this surgery scheduled? 02/12/2018  3. What type of clearance is required (medical clearance vs. Pharmacy clearance to hold med vs. Both)? Both  4. Are there any medications that need to be held prior to surgery and how long? Please advise is any medications need to be held and how long.  5. Practice name and name of physician performing surgery? [facility], Dr. Sherley Bounds  6. What is your office phone number [phone]  Ext [phone] Attn: Vanessa   7.   What is your office fax number [phone]  8.   Anesthesia type (None, local, MAC, general) ? General   Derl Barrow 01/14/2018, 4:35 PM  _________________________________________________________________   (provider comments below)

## 2018-01-14 NOTE — Telephone Encounter (Signed)
Follow Up:    Returning your call from 01-12-18.

## 2018-01-14 NOTE — Telephone Encounter (Signed)
Scheduled appt for surgical clearance 01-30-18  Pt is aware.

## 2018-01-16 NOTE — Telephone Encounter (Signed)
   Primary Cardiologist:Dr Skains  Chart reviewed as part of pre-operative protocol coverage. Because of Araminta Zorn Baillargeon's past medical history and time since last visit, he/she will require a follow-up visit in order to better assess preoperative cardiovascular risk. This has been scheduled for 01/30/18 with Dr Marlou Porch.   Kerin Ransom, PA-C  01/16/2018, 9:42 AM

## 2018-01-30 ENCOUNTER — Encounter: Payer: Self-pay | Admitting: Cardiology

## 2018-01-30 ENCOUNTER — Ambulatory Visit: Payer: BLUE CROSS/BLUE SHIELD | Admitting: Cardiology

## 2018-01-30 VITALS — BP 126/82 | HR 66 | Ht 70.0 in | Wt 323.0 lb

## 2018-01-30 DIAGNOSIS — Z01810 Encounter for preprocedural cardiovascular examination: Secondary | ICD-10-CM

## 2018-01-30 DIAGNOSIS — R0602 Shortness of breath: Secondary | ICD-10-CM

## 2018-01-30 NOTE — Progress Notes (Signed)
[facility]. [address] [place], [place]  [address] Phone: [phone] Fax:  [phone]  Date:  01/30/2018   ID:  Lindsay Little, DOB Jun 14, 1966, MRN [id number]  PCP:  [name], MD   History of Present Illness: Lindsay Little is a 52 y.o. 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 Sherley Bounds.   Since I saw her last approximately 3 years ago, she has had quite a significant medical history with rectal cancer, surgery, chemotherapy, neuropathy.  Recently has been having a exceeding leg discomfort neuropathy and had a neurosurgery evaluation and this demonstrated marked compression of her L1.  Dr. Ronnald Ramp would like to perform surgery, kyphoplasty.  She is here for risk stratification.  In addition, dilated pulmonary artery was seen 3.5 cm on CT scan, stable from prior CT scan.  No evidence of large pulmonary emboli noted on scan.  She has chronic mild shortness of breath with increased activity however she is still able to complete 4 METS of activity.  Her mobility has been limited by her neuropathy.  She does have family history of heart disease, her father died at age 72 because of coronary artery disease likely secondary to familial hyperlipidemia.    Her mother also had a 95% lesion that was stented by Dr. Irish Lack. She has 2 children, husband  Studies:   ETT 03/17/13 - 5 minutes, sinus tachycardia at rest. No ST segment changes. No chest pain, rare PVC. Blood pressure 156/96 at rest,  Stress test. Impression: Overall low risk with no electrocardiographic evidence of ischemia however poor exercise tolerance noted as well as hypertension.   Records show a former echocardiogram in 2003 showing normal function. 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 Maxwell Caul.  She is now on leave given her severe medical illness  She denies any chest pain fevers chills syncope bleeding.  Has mild shortness of breath with activity.  Wt Readings from Last 3 Encounters:  01/30/18 (!) 323 lb (146.5 kg)  12/15/17 (!) 323 lb (146.5 kg)  05/30/17 (!) 325 lb 11.2 oz (147.7 kg)     Past Medical History:  Diagnosis Date  . Anxiety   . Chemotherapy-induced neuropathy (HCC)    toes and fingers numbness and tingling  . Colostomy in place [facility]) 07/12/2016   due to anastomosis breakdown w/ colovaginal fistula  . Complication of anesthesia   . Depression   . Family history of adverse reaction to anesthesia    mother-- ponv  . Family history of breast cancer   . Family history of pancreatic cancer   . Family history of skin cancer   . Gastroenteritis 12/20/2015  . GERD (gastroesophageal reflux disease)   . Hiatal hernia   . History of cancer chemotherapy 02-20-2015 to 03-30-2015  . History of cardiac murmur as a child   . History of chemotherapy   . History of chronic gastritis   . History of hypertension    no issues since multiple abdminal sx's and chemo--- no medication since 12/ 2016  . History of TMJ disorder   . Hypercholesteremia   . IBS (irritable bowel syndrome)    dx age 65  . Intermittent abdominal pain    post-op multiple abdominal sx's   .  Microcytic anemia   . Mild sleep apnea    per pt study 2014  very mild osa , no cpap recommended, recommeded wt loss and sleep routine  . OA (osteoarthritis)    left knee /  left shoulder  . Obesity   . PONV (postoperative nausea and vomiting)    severe" needs Scopolamine PATCH"   . Portacath in place    right chest  . Rectal adenocarcinoma ([employer]) oncologis-  dr Burr Medico-- after radiation/ chemo (ypT1, N0) --  no recurrence per last note 03/ 2018   dx 01-13-2015-- Stage IIIC (T3, N2, M0) post concurrent radiation and chemotherapy 02-20-2015 to 03-30-2015 /   05-25-2015 s/p  LAR w/ RSO (post-op complicated by late abscess and contained anatomotic leak w/ help percutaneous drainage and antibiotics)   . Rotator cuff tear, left   . S/P radiation therapy 02/20/15-03/30/15   colon/rectal  . Vitamin D deficiency   . Wears glasses   . Wears glasses     Past Surgical History:  Procedure Laterality Date  . ABDOMINAL HYSTERECTOMY  1996   uterus and cervix  . COLON RESECTION N/A 07/12/2016   Procedure: LAPAROSCOPIC LYSIS OF ADHESIONS, OMENTOPEXY, HAND ASSISTED RESECTION OF  COLON, END TO END ANASTOMOSIS, COLOSTOMY;  Surgeon: Michael Boston, MD;  Location: [facility];  Service: General;  Laterality: N/A;  . COMBINED HYSTEROSCOPY DIAGNOSTIC / D&C  x2 1990's  . DIAGNOSTIC LAPAROSCOPY  age 55 and age 71  . EUS N/A 01/18/2015   Procedure: LOWER ENDOSCOPIC ULTRASOUND (EUS);  Surgeon: Arta Silence, MD;  Location: [facility];  Service: Endoscopy;  Laterality: N/A;  . EXCISION OF SKIN TAG  11/17/2015   Procedure: EXCISION OF SKIN TAG;  Surgeon: Michael Boston, MD;  Location: [facility];  Service: General;;  . ILEO LOOP COLOSTOMY CLOSURE N/A 11/17/2015   Procedure: LAPAROSCOPIC DIVERTING LOOP ILEOSTOMY  DRAINAGE OF PELVIC ABSCESS;  Surgeon: Michael Boston, MD;  Location: [facility];  Service: General;  Laterality: N/A;  . ILEOSTOMY CLOSURE N/A 05/31/2016   Procedure: TAKEDOWN LOOP ILEOSTOMY;  Surgeon: Michael Boston, MD;  Location: [facility];  Service: General;  Laterality: N/A;  . IMPACTION REMOVAL  11/17/2015   Procedure: DISIMPACTION REMOVAL;  Surgeon: Michael Boston, MD;  Location: [facility];  Service: General;;  . KNEE ARTHROSCOPY Left 1990's  . KNEE ARTHROSCOPY W/ MENISCECTOMY Left 09/14/2009   and chondroplasty debridement  . LAPAROSCOPIC LYSIS OF ADHESIONS  11/17/2015   Procedure: LAPAROSCOPIC LYSIS OF ADHESIONS;  Surgeon: Michael Boston, MD;  Location: [facility];  Service: General;;  . PORT-A-CATH REMOVAL N/A 11/21/2016   Procedure: REMOVAL PORT-A-CATH;  Surgeon: Michael Boston, MD;   Location: [facility];  Service: General;  Laterality: N/A;  . PORTACATH PLACEMENT N/A 05/25/2015   Procedure: INSERTION PORT-A-CATH;  Surgeon: Michael Boston, MD;  Location: [facility];  Service: General;  Laterality: N/A;-remains inplace Right chest.  . ROTATOR CUFF REPAIR Right 2006  . TONSILLECTOMY  age 65  . XI ROBOTIC ASSISTED LOWER ANTERIOR RESECTION N/A 05/25/2015   Procedure: XI ROBOTIC ASSISTED LOWER ANTERIOR RESECTION, , RIGID PROCTOSCOPY, RIGHT OOPHORECTOMY;  Surgeon: Michael Boston, MD;  Location: [facility];  Service: General;  Laterality: N/A;    Current Outpatient Medications  Medication Sig Dispense Refill  . Cholecalciferol (VITAMIN D3) 5000 units CAPS Take 5,000 Units by mouth at bedtime.     . Coenzyme Q10 (COQ10) 200 MG CAPS Take 200 mg by mouth at bedtime.    . DULoxetine (CYMBALTA) 60 MG capsule Take 120 mg  by mouth at bedtime.     Marland Kitchen esomeprazole (NEXIUM) 40 MG capsule Take 1 capsule (40 mg total) by mouth 2 (two) times daily before a meal. 60 capsule 5  . furosemide (LASIX) 20 MG tablet Take 20 mg by mouth See admin instructions. Take 20 mg by mouth up to twice weekly as needed for swelling    . gabapentin (NEURONTIN) 300 MG capsule Take 300-600 mg by mouth See admin instructions. Take 300 mg in the morning and afternoon and 600 mg at night    . HYDROcodone-acetaminophen (NORCO/VICODIN) 5-325 MG tablet Take 1 tablet by mouth every 6 (six) hours as needed for moderate pain. 30 tablet 0  . Melatonin 3 MG TABS Take 3-6 mg by mouth at bedtime.    . meloxicam (MOBIC) 15 MG tablet Take 15 mg by mouth at bedtime.     . methocarbamol (ROBAXIN) 500 MG tablet Take 2 tablets (1,000 mg total) by mouth every 6 (six) hours as needed for muscle spasms. 20 tablet 3  . nebivolol (BYSTOLIC) 5 MG tablet Take 5 mg by mouth at bedtime.    . ondansetron (ZOFRAN) 8 MG tablet Take 1 tablet by mouth as needed.    Marland Kitchen OVER THE COUNTER MEDICATION Place 10 drops under the tongue 2 (two) times  daily. CBD oil    . Phenazopyridine HCl (AZO-STANDARD PO) Place 1 tablet vaginally at bedtime. Azo vaginal probiotic    . PREMARIN vaginal cream Place 8.46 applicators vaginally 3 (three) times a week.  11  . rosuvastatin (CRESTOR) 5 MG tablet Take 5 mg by mouth at bedtime.    . traMADol (ULTRAM) 50 MG tablet Take 50 mg by mouth every 6 (six) hours as needed for moderate pain.     Marland Kitchen triamcinolone lotion (KENALOG) 0.1 % Apply 1 application topically as needed (dry skin).     No current facility-administered medications for this visit.    Facility-Administered Medications Ordered in Other Visits  Medication Dose Route Frequency Provider Last Rate Last Dose  . 0.9 %  sodium chloride infusion   Intravenous Once Truitt Merle, MD        Allergies:    Allergies  Allergen Reactions  . Caine-1 [Lidocaine] Swelling and Rash    Eyes swell shut; includes all caine drugs except marcaine. EMLA cream OK though (?!)  . Flagyl [Metronidazole] Nausea Only  . Adhesive [Tape] Other (See Comments)    Blisters - can use paper tape  . Iron Nausea Only  . Oxycodone     NIGHTMARES. (tolerates hydrocodone or tramadol better)  . Penicillins Rash    Has patient had a PCN reaction causing immediate rash, facial/tongue/throat swelling, SOB or lightheadedness with hypotension: no Has patient had a PCN reaction causing severe rash involving mucus membranes or skin necrosis: no Has patient had a PCN reaction that required hospitalization no Has patient had a PCN reaction occurring within the last 10 years: no If all of the above answers are "NO", then may proceed with Cephalosporin use.   . Sulfa Antibiotics Nausea And Vomiting and Rash    Social History:  The patient  reports that she quit smoking about 26 years ago. Her smoking use included cigarettes. She has a 1.75 pack-year smoking history. She has never used smokeless tobacco. She reports that she does not drink alcohol or use drugs.   Family History    Problem Relation Age of Onset  . Coronary artery disease Mother 86  . Hypertension Mother   .  Hyperlipidemia Mother   . Diabetes Mellitus I Mother   . Coronary artery disease Father   . Hyperlipidemia Father   . Hypertension Father   . Cancer Sister        skin - non melanoma  . Hyperlipidemia Brother   . Cancer Maternal Uncle 68       pancreatic with mets to colon and prostate  . Cirrhosis Maternal Uncle   . Hypertension Maternal Grandmother   . Diabetes Mellitus I Maternal Grandmother   . Hyperlipidemia Maternal Grandmother   . CVA Maternal Grandmother   . Hypertension Maternal Grandfather   . Coronary artery disease Maternal Grandfather 65  . Hyperlipidemia Maternal Grandfather   . Coronary artery disease Paternal Grandmother   . Hypertension Paternal Grandmother   . Hyperlipidemia Paternal Grandmother   . Diabetes Mellitus I Paternal Grandmother   . Hypertension Paternal Grandfather   . Hyperlipidemia Paternal Grandfather   . Coronary artery disease Paternal Grandfather     ROS:  Please see the history of present illness.   PHYSICAL EXAM: VS:  BP 126/82   Pulse 66   Ht 5' 10"  (1.778 m)   Wt (!) 323 lb (146.5 kg)   SpO2 99%   BMI 46.35 kg/m  GEN: Well nourished, well developed, in no acute distress obese HEENT: normal  Neck: no JVD, carotid bruits, or masses Cardiac: RRR; no murmurs, rubs, or gallops,no edema  Respiratory:  clear to auscultation bilaterally, normal work of breathing GI: soft, nontender, nondistended, + BS, ostomy MS: no deformity or atrophy  Skin: warm and dry, no rash Neuro:  Alert and Oriented x 3, Strength and sensation are intact Psych: euthymic mood, full affect   EKG: 12/19/2015-rhythm, poor R wave progression, left axis deviation personally viewed-prior 03/14/15 - sinus rhythm, no other abnormalities. 02/21/14  -sinus rhythm, 69, no other significant abnormalities. No change prior   12/09/2017 CT of chest with contrast: Dilated pulmonary  artery 3.5 cm suggestive of pulmonary hypertension  Labs: 09/16/13-LDL 146  ASSESSMENT AND PLAN:  Dilated pulmonary artery 3.5 cm - Suggestive of elevated right-sided heart pressures, pulmonary hypertension.  Most common reason for this would be her long-standing morbid obesity.  This dimension remained stable from prior scan.  I will check an echocardiogram to estimate pulmonary pressures.  I would not pursue right heart catheterization unless pressures appear dramatically increased especially given her ongoing cancer therapy, back surgery, abdominal surgery etc.  Preoperative risk stratification - She is of moderate risk from a cardiac perspective based mainly upon her chronic morbid obesity.  She may proceed.  She is able to complete greater than 4 METS of activity.  No anginal symptoms, no syncope.  We will check echocardiogram.  Family history of coronary artery disease -Continues with low-dose Crestor.  Morbid obesity - She did lose approximately 120 pounds during her recovery after cancer surgery.  All but 50 pounds has returned.  Rectal adenocarcinoma - Ostomy bag in place.  Will need future abdominal surgery.  Dr. Johney Maine  One year follow up.   Signed, Candee Furbish, MD [facility]  01/30/2018 12:33 PM

## 2018-01-30 NOTE — Patient Instructions (Signed)
Medication Instructions:  The current medical regimen is effective;  continue present plan and medications.  Testing/Procedures: Your physician has requested that you have an echocardiogram. Echocardiography is a painless test that uses sound waves to create images of your heart. It provides your doctor with information about the size and shape of your heart and how well your heart's chambers and valves are working. This procedure takes approximately one hour. There are no restrictions for this procedure.  Follow-Up: Follow up in 1 year with Dr. Marlou Porch.  You will receive a letter in the mail 2 months before you are due.  Please call us when you receive this letter to schedule your follow up appointment.  If you need a refill on your cardiac medications before your next appointment, please call your pharmacy.  Thank you for choosing [facility]!!

## 2018-02-02 ENCOUNTER — Ambulatory Visit (HOSPITAL_COMMUNITY): Payer: BLUE CROSS/BLUE SHIELD | Attending: Internal Medicine

## 2018-02-02 ENCOUNTER — Other Ambulatory Visit: Payer: Self-pay

## 2018-02-02 DIAGNOSIS — I429 Cardiomyopathy, unspecified: Secondary | ICD-10-CM | POA: Insufficient documentation

## 2018-02-02 DIAGNOSIS — R0602 Shortness of breath: Secondary | ICD-10-CM | POA: Diagnosis present

## 2018-02-02 NOTE — Progress Notes (Addendum)
PCP: [name], MD  Cardiologist: Candee Furbish, MD  EKG: obtained today per order  Stress test: denies  ECHO: 02/02/18 in EPIC   Cardiac Cath: denies  Chest x-ray: obtained today per order

## 2018-02-02 NOTE — Pre-Procedure Instructions (Signed)
Lindsay Little  02/02/2018      [facility], [place], [place] - [address] [place] [address] Phone: [phone] Fax: [phone]    Your procedure is scheduled on February 12, 2018.  Report to [facility] Admitting at 530 AM.  Call this number if you have problems the morning of surgery:  [phone]   Remember:  Do not eat or drink after midnight.    Take these medicines the morning of surgery with A SIP OF WATER  nebivolol (bystolic) Esomeprazole (nexium) Gabapentin (neurontin) Hydrocodone-acetaminophen (norco)-if needed Methocarbamol (robaxin) Ondansetron (zofran)-if needed Tramadol (ultram)-if needed for pain  7 days prior to surgery STOP taking any meloxicam (mobic), Aspirin (unless otherwise instructed by your surgeon), Aleve, Naproxen, Ibuprofen, Motrin, Advil, Goody's, BC's, all herbal medications, fish oil, and all vitamins     Do not wear jewelry, make-up or nail polish.  Do not wear lotions, powders, or perfumes, or deodorant.  Do not shave 48 hours prior to surgery.    Do not bring valuables to the hospital.  [facility] is not responsible for any belongings or valuables.  Contacts, dentures or bridgework may not be worn into surgery.  Leave your suitcase in the car.  After surgery it may be brought to your room.  For patients admitted to the hospital, discharge time will be determined by your treatment team.  Patients discharged the day of surgery will not be allowed to drive home.    [facility]- Preparing For Surgery  Before surgery, you can play an important role. Because skin is not sterile, your skin needs to be as free of germs as possible. You can reduce the number of germs on your skin by washing with CHG (chlorahexidine gluconate) Soap before surgery.  CHG is an antiseptic cleaner which kills germs and bonds with the skin to continue killing germs even after washing.    Oral Hygiene  is also important to reduce your risk of infection.  Remember - BRUSH YOUR TEETH THE MORNING OF SURGERY WITH YOUR REGULAR TOOTHPASTE  Please do not use if you have an allergy to CHG or antibacterial soaps. If your skin becomes reddened/irritated stop using the CHG.  Do not shave (including legs and underarms) for at least 48 hours prior to first CHG shower. It is OK to shave your face.  Please follow these instructions carefully.   1. Shower the NIGHT BEFORE SURGERY and the MORNING OF SURGERY with CHG.   2. If you chose to wash your hair, wash your hair first as usual with your normal shampoo.  3. After you shampoo, rinse your hair and body thoroughly to remove the shampoo.  4. Use CHG as you would any other liquid soap. You can apply CHG directly to the skin and wash gently with a scrungie or a clean washcloth.   5. Apply the CHG Soap to your body ONLY FROM THE NECK DOWN.  Do not use on open wounds or open sores. Avoid contact with your eyes, ears, mouth and genitals (private parts). Wash Face and genitals (private parts)  with your normal soap.  6. Wash thoroughly, paying special attention to the area where your surgery will be performed.  7. Thoroughly rinse your body with warm water from the neck down.  8. DO NOT shower/wash with your normal soap after using and rinsing off the CHG Soap.  9. Pat yourself dry with a  CLEAN TOWEL.  10. Wear CLEAN PAJAMAS to bed the night before surgery, wear comfortable clothes the morning of surgery  11. Place CLEAN SHEETS on your bed the night of your first shower and DO NOT SLEEP WITH PETS.  Day of Surgery:  Do not apply any deodorants/lotions.  Please wear clean clothes to the hospital/surgery center.   Remember to brush your teeth WITH YOUR REGULAR TOOTHPASTE.  Please read over the following fact sheets that you were given. Pain Booklet, Coughing and Deep Breathing, MRSA Information and Surgical Site Infection Prevention

## 2018-02-03 ENCOUNTER — Encounter (HOSPITAL_COMMUNITY)
Admission: RE | Admit: 2018-02-03 | Discharge: 2018-02-03 | Disposition: A | Discharge status: Home / Self Care | Payer: BLUE CROSS/BLUE SHIELD | Source: Ambulatory Visit | Attending: Neurological Surgery | Admitting: Neurological Surgery

## 2018-02-03 ENCOUNTER — Other Ambulatory Visit: Payer: Self-pay

## 2018-02-03 ENCOUNTER — Encounter (HOSPITAL_COMMUNITY): Payer: Self-pay

## 2018-02-03 DIAGNOSIS — Z01810 Encounter for preprocedural cardiovascular examination: Secondary | ICD-10-CM | POA: Diagnosis present

## 2018-02-03 DIAGNOSIS — M5134 Other intervertebral disc degeneration, thoracic region: Secondary | ICD-10-CM | POA: Insufficient documentation

## 2018-02-03 DIAGNOSIS — Z01812 Encounter for preprocedural laboratory examination: Secondary | ICD-10-CM | POA: Insufficient documentation

## 2018-02-03 DIAGNOSIS — Z01818 Encounter for other preprocedural examination: Secondary | ICD-10-CM | POA: Diagnosis present

## 2018-02-03 DIAGNOSIS — M4184 Other forms of scoliosis, thoracic region: Secondary | ICD-10-CM | POA: Diagnosis not present

## 2018-02-03 DIAGNOSIS — M4310 Spondylolisthesis, site unspecified: Secondary | ICD-10-CM

## 2018-02-03 LAB — CBC WITH DIFFERENTIAL/PLATELET
ABS IMMATURE GRANULOCYTES: 0 10*3/uL (ref 0.0–0.1)
BASOS ABS: 0.1 10*3/uL (ref 0.0–0.1)
Basophils Relative: 1 %
Eosinophils Absolute: 0.1 10*3/uL (ref 0.0–0.7)
Eosinophils Relative: 2 %
HEMATOCRIT: 41 % (ref 36.0–46.0)
HEMOGLOBIN: 12.9 g/dL (ref 12.0–15.0)
Immature Granulocytes: 0 %
LYMPHS ABS: 1.2 10*3/uL (ref 0.7–4.0)
LYMPHS PCT: 18 %
MCH: 28.8 pg (ref 26.0–34.0)
MCHC: 31.5 g/dL (ref 30.0–36.0)
MCV: 91.5 fL (ref 78.0–100.0)
MONO ABS: 0.4 10*3/uL (ref 0.1–1.0)
MONOS PCT: 6 %
NEUTROS ABS: 4.7 10*3/uL (ref 1.7–7.7)
Neutrophils Relative %: 73 %
Platelets: 258 10*3/uL (ref 150–400)
RBC: 4.48 MIL/uL (ref 3.87–5.11)
RDW: 13.3 % (ref 11.5–15.5)
WBC: 6.5 10*3/uL (ref 4.0–10.5)

## 2018-02-03 LAB — TYPE AND SCREEN
ABO/RH(D): O POS
ANTIBODY SCREEN: NEGATIVE

## 2018-02-03 LAB — BASIC METABOLIC PANEL
ANION GAP: 6 (ref 5–15)
BUN: 14 mg/dL (ref 6–20)
CHLORIDE: 108 mmol/L (ref 98–111)
CO2: 26 mmol/L (ref 22–32)
Calcium: 9.7 mg/dL (ref 8.9–10.3)
Creatinine, Ser: 0.62 mg/dL (ref 0.44–1.00)
GFR calc Af Amer: >60 mL/min (ref >60)
GFR calc non Af Amer: >60 mL/min (ref >60)
Glucose, Bld: 98 mg/dL (ref 70–99)
POTASSIUM: 4.4 mmol/L (ref 3.5–5.1)
Sodium: 140 mmol/L (ref 135–145)

## 2018-02-03 LAB — PROTIME-INR
INR: 0.95
PROTHROMBIN TIME: 12.6 s (ref 11.4–15.2)

## 2018-02-03 LAB — SURGICAL PCR SCREEN
MRSA, PCR: NEGATIVE
Staphylococcus aureus: NEGATIVE

## 2018-02-03 LAB — ABO/RH: ABO/RH(D): O POS

## 2018-02-03 NOTE — Progress Notes (Signed)
Anesthesia Chart Review:  Case:  [id number] Date/Time:  02/12/18 0720   Procedure:  PLIF - L4-L5 (N/A Back)   Anesthesia type:  General   Pre-op diagnosis:  Spondylolisthesis   Location:  [facility] OR ROOM 20 / [place] [facility]   Surgeon:  Eustace Moore, MD      DISCUSSION: 52 yo female former smoker for above procedure. Pertinent hx includes PONV, Depression, Colorectal CA s/p chemo/radiation, Arthritis, HTN, Colostomy in place, Anemia,HLD, GERD, Anxiety, Hiatal hernia, mild OSA no CPAP, Chemo induced neuropathy, TMJ.  Pt was seen by cardiology Dr. Marlou Porch 02/09/2018 for preop clearance, per his note "She is of moderate risk from a cardiac perspective based mainly upon her chronic morbid obesity.  She may proceed.  She is able to complete greater than 4 METS of activity.  No anginal symptoms, no syncope.  We will check echocardiogram."  Echo 02/02/2018 showed LVEF 50-55%, mild LVH, normal wall motion, grade 1 DD, indeterminate LV filling pressure, trivial MR, normal LA Size, normal IVC. Comment by Dr. Marlou Porch "ECHO reassuring. Normal EF. Normal RV function."  Anticipate she can proceed with surgery as planned barring acute status change.  VS: BP 140/64   Pulse 67   Temp 37 C (Oral)   Resp 18   Ht 5' 10"  (1.778 m)   Wt (!) 323 lb (146.5 kg)   SpO2 99%   BMI 46.35 kg/m   PROVIDERS: [name], MD is PCP  Candee Furbish, MD is Cardiologist last seen 01/30/2018 for surgical clearance   LABS: Labs reviewed: Acceptable for surgery. (all labs ordered are listed, but only abnormal results are displayed)  Labs Reviewed  SURGICAL PCR SCREEN  BASIC METABOLIC PANEL  CBC WITH DIFFERENTIAL/PLATELET  PROTIME-INR  TYPE AND SCREEN  ABO/RH     IMAGES: CHEST - 2 VIEW 02/03/2018  COMPARISON:  CT 12/09/2017.  FINDINGS: Mediastinum and hilar structures normal. Lungs are clear. No pleural effusion pneumothorax. Heart size normal. Thoracic spine scoliosis and degenerative change.  IMPRESSION: No acute  cardiopulmonary disease. Thoracic spine scoliosis and degenerative change.   EKG: 02/03/2018: Normal sinus rhythm. Minimal voltage criteria for LVH, may be normal variant  CV: 02/02/2018: Study Conclusions  - Left ventricle: The cavity size was normal. Wall thickness was   increased in a pattern of mild LVH. Systolic function was normal.   The estimated ejection fraction was in the range of 50% to 55%.   Wall motion was normal; there were no regional wall motion   abnormalities. Doppler parameters are consistent with abnormal   left ventricular relaxation (grade 1 diastolic dysfunction). The   E/e&' ratio is between 8-15, suggesting indeterminate LV filling   pressure. - Mitral valve: Calcified annulus. Mildly thickened leaflets .   There was trivial regurgitation. - Left atrium: The atrium was normal in size. - Inferior vena cava: The vessel was normal in size. The   respirophasic diameter changes were in the normal range (>= 50%),   consistent with normal central venous pressure.  Impressions:  - LVEF 50-55%, mild LVH, normal wall motion, grade 1 DD,   indeterminate LV filling pressure, trivial MR, normal LA Size,   normal IVC.  Past Medical History:  Diagnosis Date  . Anxiety   . Chemotherapy-induced neuropathy (HCC)    toes and fingers numbness and tingling  . Colostomy in place [facility]) 07/12/2016   due to anastomosis breakdown w/ colovaginal fistula  . Complication of anesthesia   . Depression   . Family history of adverse  reaction to anesthesia    mother-- ponv  . Family history of breast cancer   . Family history of pancreatic cancer   . Family history of skin cancer   . Gastroenteritis 12/20/2015  . GERD (gastroesophageal reflux disease)   . Hiatal hernia   . History of cancer chemotherapy 02-20-2015 to 03-30-2015  . History of cardiac murmur as a child   . History of chemotherapy   . History of chronic gastritis   . History of hypertension    no issues  since multiple abdminal sx's and chemo--- no medication since 12/ 2016  . History of TMJ disorder   . Hypercholesteremia   . IBS (irritable bowel syndrome)    dx age 26  . Intermittent abdominal pain    post-op multiple abdominal sx's   . Microcytic anemia   . Mild sleep apnea    per pt study 2014  very mild osa , no cpap recommended, recommeded wt loss and sleep routine  . OA (osteoarthritis)    left knee /  left shoulder  . Obesity   . PONV (postoperative nausea and vomiting)    severe" needs Scopolamine PATCH"   . Portacath in place    right chest  . Rectal adenocarcinoma ([place]) oncologis-  dr Burr Medico-- after radiation/ chemo (ypT1, N0) --  no recurrence per last note 03/ 2018   dx 01-13-2015-- Stage IIIC (T3, N2, M0) post concurrent radiation and chemotherapy 02-20-2015 to 03-30-2015 /  05-25-2015 s/p  LAR w/ RSO (post-op complicated by late abscess and contained anatomotic leak w/ help percutaneous drainage and antibiotics)   . Rotator cuff tear, left   . S/P radiation therapy 02/20/15-03/30/15   colon/rectal  . Vitamin D deficiency   . Wears glasses   . Wears glasses     Past Surgical History:  Procedure Laterality Date  . ABDOMINAL HYSTERECTOMY  1996   uterus and cervix  . COLON RESECTION N/A 07/12/2016   Procedure: LAPAROSCOPIC LYSIS OF ADHESIONS, OMENTOPEXY, HAND ASSISTED RESECTION OF  COLON, END TO END ANASTOMOSIS, COLOSTOMY;  Surgeon: Michael Boston, MD;  Location: [facility];  Service: General;  Laterality: N/A;  . COMBINED HYSTEROSCOPY DIAGNOSTIC / D&C  x2 1990's  . DIAGNOSTIC LAPAROSCOPY  age 61 and age 50  . EUS N/A 01/18/2015   Procedure: LOWER ENDOSCOPIC ULTRASOUND (EUS);  Surgeon: Arta Silence, MD;  Location: [facility];  Service: Endoscopy;  Laterality: N/A;  . EXCISION OF SKIN TAG  11/17/2015   Procedure: EXCISION OF SKIN TAG;  Surgeon: Michael Boston, MD;  Location: [facility];  Service: General;;  . ILEO LOOP COLOSTOMY CLOSURE N/A 11/17/2015   Procedure: LAPAROSCOPIC  DIVERTING LOOP ILEOSTOMY  DRAINAGE OF PELVIC ABSCESS;  Surgeon: Michael Boston, MD;  Location: [facility];  Service: General;  Laterality: N/A;  . ILEOSTOMY CLOSURE N/A 05/31/2016   Procedure: TAKEDOWN LOOP ILEOSTOMY;  Surgeon: Michael Boston, MD;  Location: [facility];  Service: General;  Laterality: N/A;  . IMPACTION REMOVAL  11/17/2015   Procedure: DISIMPACTION REMOVAL;  Surgeon: Michael Boston, MD;  Location: [facility];  Service: General;;  . KNEE ARTHROSCOPY Left 1990's  . KNEE ARTHROSCOPY W/ MENISCECTOMY Left 09/14/2009   and chondroplasty debridement  . LAPAROSCOPIC LYSIS OF ADHESIONS  11/17/2015   Procedure: LAPAROSCOPIC LYSIS OF ADHESIONS;  Surgeon: Michael Boston, MD;  Location: [facility];  Service: General;;  . PORT-A-CATH REMOVAL N/A 11/21/2016   Procedure: REMOVAL PORT-A-CATH;  Surgeon: Michael Boston, MD;  Location: [facility];  Service: General;  Laterality: N/A;  . PORTACATH PLACEMENT N/A 05/25/2015   Procedure: INSERTION PORT-A-CATH;  Surgeon: Michael Boston, MD;  Location: [facility];  Service: General;  Laterality: N/A;-remains inplace Right chest.  . ROTATOR CUFF REPAIR Right 2006  . TONSILLECTOMY  age 13  . XI ROBOTIC ASSISTED LOWER ANTERIOR RESECTION N/A 05/25/2015   Procedure: XI ROBOTIC ASSISTED LOWER ANTERIOR RESECTION, , RIGID PROCTOSCOPY, RIGHT OOPHORECTOMY;  Surgeon: Michael Boston, MD;  Location: [facility];  Service: General;  Laterality: N/A;    MEDICATIONS: . Cholecalciferol (VITAMIN D3) 5000 units CAPS  . Coenzyme Q10 (COQ10) 200 MG CAPS  . DULoxetine (CYMBALTA) 60 MG capsule  . esomeprazole (NEXIUM) 40 MG capsule  . furosemide (LASIX) 20 MG tablet  . gabapentin (NEURONTIN) 300 MG capsule  . HYDROcodone-acetaminophen (NORCO/VICODIN) 5-325 MG tablet  . Melatonin 3 MG TABS  . meloxicam (MOBIC) 15 MG tablet  . methocarbamol (ROBAXIN) 500 MG tablet  . nebivolol (BYSTOLIC) 5 MG tablet  . ondansetron (ZOFRAN) 8 MG tablet  . OVER THE COUNTER MEDICATION  . Phenazopyridine HCl  (AZO-STANDARD PO)  . PREMARIN vaginal cream  . rosuvastatin (CRESTOR) 5 MG tablet  . traMADol (ULTRAM) 50 MG tablet  . triamcinolone lotion (KENALOG) 0.1 %   No current facility-administered medications for this encounter.    Marland Kitchen 0.9 %  sodium chloride infusion    Wynonia Musty [facility]/Anesthesiology Phone [phone] 02/03/2018 4:36 PM

## 2018-02-11 MED ORDER — VANCOMYCIN HCL 10 G IV SOLR
1500.0000 mg | INTRAVENOUS | Status: AC
  Administered 2018-02-12: 1500 mg via INTRAVENOUS
  Filled 2018-02-11: qty 1500

## 2018-02-11 MED ORDER — BUPIVACAINE LIPOSOME 1.3 % IJ SUSP
20.0000 mL | INTRAMUSCULAR | Status: DC
  Filled 2018-02-11: qty 20

## 2018-02-12 ENCOUNTER — Inpatient Hospital Stay (HOSPITAL_COMMUNITY): Payer: BLUE CROSS/BLUE SHIELD | Admitting: Physician Assistant

## 2018-02-12 ENCOUNTER — Inpatient Hospital Stay (HOSPITAL_COMMUNITY)
Admission: RE | Admit: 2018-02-12 | Discharge: 2018-02-13 | DRG: 454 | Disposition: A | Discharge status: Home / Self Care | Payer: BLUE CROSS/BLUE SHIELD | Source: Ambulatory Visit | Attending: Neurological Surgery | Admitting: Neurological Surgery

## 2018-02-12 ENCOUNTER — Inpatient Hospital Stay (HOSPITAL_COMMUNITY): Payer: BLUE CROSS/BLUE SHIELD | Admitting: Anesthesiology

## 2018-02-12 ENCOUNTER — Other Ambulatory Visit: Payer: Self-pay

## 2018-02-12 ENCOUNTER — Encounter (HOSPITAL_COMMUNITY)
Admission: RE | Disposition: A | Discharge status: Home / Self Care | Payer: Self-pay | Source: Ambulatory Visit | Attending: Neurological Surgery

## 2018-02-12 ENCOUNTER — Inpatient Hospital Stay (HOSPITAL_COMMUNITY): Payer: BLUE CROSS/BLUE SHIELD

## 2018-02-12 ENCOUNTER — Encounter (HOSPITAL_COMMUNITY): Payer: Self-pay | Admitting: General Practice

## 2018-02-12 DIAGNOSIS — F419 Anxiety disorder, unspecified: Secondary | ICD-10-CM | POA: Diagnosis present

## 2018-02-12 DIAGNOSIS — Z923 Personal history of irradiation: Secondary | ICD-10-CM

## 2018-02-12 DIAGNOSIS — K2950 Unspecified chronic gastritis without bleeding: Secondary | ICD-10-CM | POA: Diagnosis present

## 2018-02-12 DIAGNOSIS — M48061 Spinal stenosis, lumbar region without neurogenic claudication: Secondary | ICD-10-CM | POA: Diagnosis present

## 2018-02-12 DIAGNOSIS — M1990 Unspecified osteoarthritis, unspecified site: Secondary | ICD-10-CM | POA: Diagnosis present

## 2018-02-12 DIAGNOSIS — Z85048 Personal history of other malignant neoplasm of rectum, rectosigmoid junction, and anus: Secondary | ICD-10-CM

## 2018-02-12 DIAGNOSIS — K529 Noninfective gastroenteritis and colitis, unspecified: Secondary | ICD-10-CM | POA: Diagnosis present

## 2018-02-12 DIAGNOSIS — M4316 Spondylolisthesis, lumbar region: Secondary | ICD-10-CM | POA: Diagnosis present

## 2018-02-12 DIAGNOSIS — Z91048 Other nonmedicinal substance allergy status: Secondary | ICD-10-CM

## 2018-02-12 DIAGNOSIS — Z885 Allergy status to narcotic agent status: Secondary | ICD-10-CM | POA: Diagnosis not present

## 2018-02-12 DIAGNOSIS — K219 Gastro-esophageal reflux disease without esophagitis: Secondary | ICD-10-CM | POA: Diagnosis present

## 2018-02-12 DIAGNOSIS — T451X5A Adverse effect of antineoplastic and immunosuppressive drugs, initial encounter: Secondary | ICD-10-CM | POA: Diagnosis present

## 2018-02-12 DIAGNOSIS — Z808 Family history of malignant neoplasm of other organs or systems: Secondary | ICD-10-CM

## 2018-02-12 DIAGNOSIS — Z803 Family history of malignant neoplasm of breast: Secondary | ICD-10-CM

## 2018-02-12 DIAGNOSIS — F329 Major depressive disorder, single episode, unspecified: Secondary | ICD-10-CM | POA: Diagnosis present

## 2018-02-12 DIAGNOSIS — Z6841 Body Mass Index (BMI) 40.0 and over, adult: Secondary | ICD-10-CM

## 2018-02-12 DIAGNOSIS — Z888 Allergy status to other drugs, medicaments and biological substances status: Secondary | ICD-10-CM

## 2018-02-12 DIAGNOSIS — G620 Drug-induced polyneuropathy: Secondary | ICD-10-CM | POA: Diagnosis present

## 2018-02-12 DIAGNOSIS — Z800 Family history of malignant neoplasm of digestive organs: Secondary | ICD-10-CM

## 2018-02-12 DIAGNOSIS — I10 Essential (primary) hypertension: Secondary | ICD-10-CM | POA: Diagnosis present

## 2018-02-12 DIAGNOSIS — E7800 Pure hypercholesterolemia, unspecified: Secondary | ICD-10-CM | POA: Diagnosis present

## 2018-02-12 DIAGNOSIS — Z880 Allergy status to penicillin: Secondary | ICD-10-CM

## 2018-02-12 DIAGNOSIS — Z79899 Other long term (current) drug therapy: Secondary | ICD-10-CM

## 2018-02-12 DIAGNOSIS — Z419 Encounter for procedure for purposes other than remedying health state, unspecified: Secondary | ICD-10-CM

## 2018-02-12 DIAGNOSIS — Z981 Arthrodesis status: Secondary | ICD-10-CM

## 2018-02-12 DIAGNOSIS — Z882 Allergy status to sulfonamides status: Secondary | ICD-10-CM

## 2018-02-12 DIAGNOSIS — C20 Malignant neoplasm of rectum: Secondary | ICD-10-CM

## 2018-02-12 DIAGNOSIS — Z8249 Family history of ischemic heart disease and other diseases of the circulatory system: Secondary | ICD-10-CM

## 2018-02-12 DIAGNOSIS — Z933 Colostomy status: Secondary | ICD-10-CM | POA: Diagnosis not present

## 2018-02-12 HISTORY — PX: BACK SURGERY: SHX140

## 2018-02-12 SURGERY — POSTERIOR LUMBAR FUSION 1 LEVEL
Anesthesia: General | Site: Spine Lumbar

## 2018-02-12 MED ORDER — VANCOMYCIN HCL 1000 MG IV SOLR
INTRAVENOUS | Status: DC | PRN
  Administered 2018-02-12: 1000 mg

## 2018-02-12 MED ORDER — BUPIVACAINE HCL (PF) 0.25 % IJ SOLN
INTRAMUSCULAR | Status: DC | PRN
  Administered 2018-02-12: 7 mL

## 2018-02-12 MED ORDER — SCOPOLAMINE 1 MG/3DAYS TD PT72
MEDICATED_PATCH | TRANSDERMAL | Status: AC
  Filled 2018-02-12: qty 1

## 2018-02-12 MED ORDER — LACTATED RINGERS IV SOLN
INTRAVENOUS | Status: DC
  Administered 2018-02-12 (×2): via INTRAVENOUS

## 2018-02-12 MED ORDER — VANCOMYCIN HCL IN DEXTROSE 1-5 GM/200ML-% IV SOLN
1000.0000 mg | Freq: Once | INTRAVENOUS | Status: AC
  Administered 2018-02-12: 1000 mg via INTRAVENOUS
  Filled 2018-02-12: qty 200

## 2018-02-12 MED ORDER — HEPARIN SODIUM (PORCINE) 1000 UNIT/ML IJ SOLN
INTRAMUSCULAR | Status: AC
  Filled 2018-02-12: qty 1

## 2018-02-12 MED ORDER — FENTANYL CITRATE (PF) 250 MCG/5ML IJ SOLN
INTRAMUSCULAR | Status: AC
  Filled 2018-02-12: qty 5

## 2018-02-12 MED ORDER — FUROSEMIDE 20 MG PO TABS: 20.0000 mg | ORAL_TABLET | Freq: Every day | ORAL | Status: DC | PRN

## 2018-02-12 MED ORDER — NEBIVOLOL HCL 5 MG PO TABS
5.0000 mg | ORAL_TABLET | Freq: Every day | ORAL | Status: DC
  Administered 2018-02-12: 5 mg via ORAL
  Filled 2018-02-12 (×2): qty 1

## 2018-02-12 MED ORDER — SUGAMMADEX SODIUM 200 MG/2ML IV SOLN: INTRAVENOUS | Status: DC | PRN

## 2018-02-12 MED ORDER — PROMETHAZINE HCL 25 MG/ML IJ SOLN: 6.2500 mg | INTRAMUSCULAR | Status: DC | PRN

## 2018-02-12 MED ORDER — SODIUM CHLORIDE 0.9 % IV SOLN: 250.0000 mL | INTRAVENOUS | Status: DC

## 2018-02-12 MED ORDER — GABAPENTIN 300 MG PO CAPS
300.0000 mg | ORAL_CAPSULE | Freq: Two times a day (BID) | ORAL | Status: DC
  Administered 2018-02-13 (×2): 300 mg via ORAL
  Filled 2018-02-12 (×2): qty 1

## 2018-02-12 MED ORDER — CHLORHEXIDINE GLUCONATE CLOTH 2 % EX PADS: 6.0000 | MEDICATED_PAD | Freq: Once | CUTANEOUS | Status: DC

## 2018-02-12 MED ORDER — PHENYLEPHRINE 40 MCG/ML (10ML) SYRINGE FOR IV PUSH (FOR BLOOD PRESSURE SUPPORT)
PREFILLED_SYRINGE | INTRAVENOUS | Status: AC
  Filled 2018-02-12: qty 10

## 2018-02-12 MED ORDER — ACETAMINOPHEN 325 MG PO TABS: 650.0000 mg | ORAL_TABLET | ORAL | Status: DC | PRN

## 2018-02-12 MED ORDER — PROPOFOL 500 MG/50ML IV EMUL
INTRAVENOUS | Status: DC | PRN
  Administered 2018-02-12: 50 ug/kg/min via INTRAVENOUS

## 2018-02-12 MED ORDER — METHOCARBAMOL 500 MG PO TABS
1000.0000 mg | ORAL_TABLET | Freq: Four times a day (QID) | ORAL | Status: DC | PRN
Start: 2018-02-12 — End: 2018-02-13
  Administered 2018-02-12 – 2018-02-13 (×4): 1000 mg via ORAL
  Filled 2018-02-12 (×5): qty 2

## 2018-02-12 MED ORDER — LACTATED RINGERS IV SOLN: INTRAVENOUS | Status: DC

## 2018-02-12 MED ORDER — BUPIVACAINE HCL (PF) 0.25 % IJ SOLN
INTRAMUSCULAR | Status: AC
Start: 2018-02-12 — End: ?
  Filled 2018-02-12: qty 30

## 2018-02-12 MED ORDER — FENTANYL CITRATE (PF) 100 MCG/2ML IJ SOLN
INTRAMUSCULAR | Status: DC | PRN
  Administered 2018-02-12 (×2): 50 ug via INTRAVENOUS
  Administered 2018-02-12: 150 ug via INTRAVENOUS
  Administered 2018-02-12 (×2): 50 ug via INTRAVENOUS

## 2018-02-12 MED ORDER — ONDANSETRON HCL 4 MG/2ML IJ SOLN: 4.0000 mg | Freq: Four times a day (QID) | INTRAMUSCULAR | Status: DC | PRN

## 2018-02-12 MED ORDER — MEPERIDINE HCL 50 MG/ML IJ SOLN: 6.2500 mg | INTRAMUSCULAR | Status: DC | PRN

## 2018-02-12 MED ORDER — HYDROMORPHONE HCL 1 MG/ML IJ SOLN
1.0000 mg | INTRAMUSCULAR | Status: DC | PRN
  Administered 2018-02-12: 1 mg via INTRAVENOUS
  Filled 2018-02-12: qty 1

## 2018-02-12 MED ORDER — DEXAMETHASONE SODIUM PHOSPHATE 10 MG/ML IJ SOLN
INTRAMUSCULAR | Status: AC
  Filled 2018-02-12: qty 1

## 2018-02-12 MED ORDER — ACETAMINOPHEN 650 MG RE SUPP: 650.0000 mg | RECTAL | Status: DC | PRN

## 2018-02-12 MED ORDER — EPHEDRINE 5 MG/ML INJ
INTRAVENOUS | Status: AC
Start: 2018-02-12 — End: ?
  Filled 2018-02-12: qty 10

## 2018-02-12 MED ORDER — PHENYLEPHRINE 40 MCG/ML (10ML) SYRINGE FOR IV PUSH (FOR BLOOD PRESSURE SUPPORT)
PREFILLED_SYRINGE | INTRAVENOUS | Status: DC | PRN
  Administered 2018-02-12: 120 ug via INTRAVENOUS

## 2018-02-12 MED ORDER — ACETAMINOPHEN 10 MG/ML IV SOLN
INTRAVENOUS | Status: AC
  Filled 2018-02-12: qty 100

## 2018-02-12 MED ORDER — ONDANSETRON HCL 4 MG PO TABS: 4.0000 mg | ORAL_TABLET | Freq: Four times a day (QID) | ORAL | Status: DC | PRN

## 2018-02-12 MED ORDER — VITAMIN D3 125 MCG (5000 UT) PO CAPS: 5000.0000 [IU] | ORAL_CAPSULE | Freq: Every day | ORAL | Status: DC

## 2018-02-12 MED ORDER — HYDROMORPHONE HCL 1 MG/ML IJ SOLN
INTRAMUSCULAR | Status: AC
  Filled 2018-02-12: qty 1

## 2018-02-12 MED ORDER — HYDROCODONE-ACETAMINOPHEN 5-325 MG PO TABS: 1.0000 | ORAL_TABLET | Freq: Four times a day (QID) | ORAL | Status: DC | PRN

## 2018-02-12 MED ORDER — SUGAMMADEX SODIUM 200 MG/2ML IV SOLN
INTRAVENOUS | Status: AC
  Filled 2018-02-12: qty 2

## 2018-02-12 MED ORDER — LIDOCAINE 2% (20 MG/ML) 5 ML SYRINGE
INTRAMUSCULAR | Status: AC
  Filled 2018-02-12: qty 5

## 2018-02-12 MED ORDER — PROPOFOL 10 MG/ML IV BOLUS
INTRAVENOUS | Status: AC
  Filled 2018-02-12: qty 40

## 2018-02-12 MED ORDER — HEPARIN SODIUM (PORCINE) 1000 UNIT/ML IJ SOLN: INTRAMUSCULAR | Status: DC | PRN

## 2018-02-12 MED ORDER — THROMBIN 5000 UNITS EX SOLR
CUTANEOUS | Status: AC
Start: 2018-02-12 — End: ?
  Filled 2018-02-12: qty 10000

## 2018-02-12 MED ORDER — HYDROMORPHONE HCL 1 MG/ML IJ SOLN
0.2500 mg | INTRAMUSCULAR | Status: DC | PRN
  Administered 2018-02-12 (×4): 0.5 mg via INTRAVENOUS

## 2018-02-12 MED ORDER — GABAPENTIN 300 MG PO CAPS
600.0000 mg | ORAL_CAPSULE | Freq: Every day | ORAL | Status: DC
  Administered 2018-02-12: 600 mg via ORAL
  Filled 2018-02-12: qty 2

## 2018-02-12 MED ORDER — SUCCINYLCHOLINE CHLORIDE 200 MG/10ML IV SOSY
PREFILLED_SYRINGE | INTRAVENOUS | Status: AC
  Filled 2018-02-12: qty 10

## 2018-02-12 MED ORDER — MIDAZOLAM HCL 5 MG/5ML IJ SOLN
INTRAMUSCULAR | Status: DC | PRN
  Administered 2018-02-12: 2 mg via INTRAVENOUS

## 2018-02-12 MED ORDER — SODIUM CHLORIDE 0.9 % IV SOLN
INTRAVENOUS | Status: DC | PRN
  Administered 2018-02-12: 25 ug/min via INTRAVENOUS

## 2018-02-12 MED ORDER — MIDAZOLAM HCL 2 MG/2ML IJ SOLN
INTRAMUSCULAR | Status: AC
  Filled 2018-02-12: qty 2

## 2018-02-12 MED ORDER — GABAPENTIN 300 MG PO CAPS: 300.0000 mg | ORAL_CAPSULE | ORAL | Status: DC

## 2018-02-12 MED ORDER — POTASSIUM CHLORIDE IN NACL 20-0.9 MEQ/L-% IV SOLN
INTRAVENOUS | Status: DC
  Administered 2018-02-12: 15:00:00 via INTRAVENOUS

## 2018-02-12 MED ORDER — SODIUM CHLORIDE 0.9 % IV SOLN
INTRAVENOUS | Status: DC | PRN
  Administered 2018-02-12: 500 mL

## 2018-02-12 MED ORDER — SURGIFOAM 100 EX MISC
CUTANEOUS | Status: DC | PRN
  Administered 2018-02-12: 09:00:00

## 2018-02-12 MED ORDER — SODIUM CHLORIDE 0.9% FLUSH: 3.0000 mL | INTRAVENOUS | Status: DC | PRN

## 2018-02-12 MED ORDER — HYDROMORPHONE HCL 1 MG/ML IJ SOLN
0.5000 mg | INTRAMUSCULAR | Status: AC | PRN
  Administered 2018-02-12 (×2): 0.5 mg via INTRAVENOUS

## 2018-02-12 MED ORDER — PROPOFOL 10 MG/ML IV BOLUS
INTRAVENOUS | Status: DC | PRN
  Administered 2018-02-12: 200 mg via INTRAVENOUS

## 2018-02-12 MED ORDER — SUCCINYLCHOLINE CHLORIDE 200 MG/10ML IV SOSY
PREFILLED_SYRINGE | INTRAVENOUS | Status: DC | PRN
  Administered 2018-02-12: 80 mg via INTRAVENOUS

## 2018-02-12 MED ORDER — SODIUM CHLORIDE 0.9% FLUSH
3.0000 mL | Freq: Two times a day (BID) | INTRAVENOUS | Status: DC
  Administered 2018-02-12: 3 mL via INTRAVENOUS

## 2018-02-12 MED ORDER — ONDANSETRON HCL 4 MG/2ML IJ SOLN
INTRAMUSCULAR | Status: DC | PRN
  Administered 2018-02-12: 4 mg via INTRAVENOUS

## 2018-02-12 MED ORDER — VANCOMYCIN HCL 1000 MG IV SOLR
INTRAVENOUS | Status: AC
  Filled 2018-02-12: qty 1000

## 2018-02-12 MED ORDER — ACETAMINOPHEN 10 MG/ML IV SOLN
1000.0000 mg | Freq: Four times a day (QID) | INTRAVENOUS | Status: DC
  Administered 2018-02-12: 1000 mg via INTRAVENOUS

## 2018-02-12 MED ORDER — MENTHOL 3 MG MT LOZG: 1.0000 | LOZENGE | OROMUCOSAL | Status: DC | PRN

## 2018-02-12 MED ORDER — SCOPOLAMINE 1 MG/3DAYS TD PT72
MEDICATED_PATCH | TRANSDERMAL | Status: DC | PRN
  Administered 2018-02-12: 1 via TRANSDERMAL

## 2018-02-12 MED ORDER — THROMBIN 5000 UNITS EX SOLR
OROMUCOSAL | Status: DC | PRN
  Administered 2018-02-12: 09:00:00

## 2018-02-12 MED ORDER — THROMBIN (RECOMBINANT) 20000 UNITS EX SOLR
CUTANEOUS | Status: AC
  Filled 2018-02-12: qty 20000

## 2018-02-12 MED ORDER — PHENOL 1.4 % MT LIQD: 1.0000 | OROMUCOSAL | Status: DC | PRN

## 2018-02-12 MED ORDER — HYDROCODONE-ACETAMINOPHEN 5-325 MG PO TABS
1.0000 | ORAL_TABLET | ORAL | Status: DC | PRN
  Administered 2018-02-12 – 2018-02-13 (×3): 2 via ORAL
  Administered 2018-02-13 (×2): 1 via ORAL
  Filled 2018-02-12 (×5): qty 2

## 2018-02-12 MED ORDER — 0.9 % SODIUM CHLORIDE (POUR BTL) OPTIME
TOPICAL | Status: DC | PRN
  Administered 2018-02-12: 1000 mL

## 2018-02-12 MED ORDER — SENNA 8.6 MG PO TABS
1.0000 | ORAL_TABLET | Freq: Two times a day (BID) | ORAL | Status: DC
  Administered 2018-02-12 – 2018-02-13 (×2): 8.6 mg via ORAL
  Filled 2018-02-12 (×2): qty 1

## 2018-02-12 MED ORDER — ROCURONIUM BROMIDE 10 MG/ML (PF) SYRINGE
PREFILLED_SYRINGE | INTRAVENOUS | Status: AC
  Filled 2018-02-12: qty 10

## 2018-02-12 MED ORDER — DEXAMETHASONE SODIUM PHOSPHATE 10 MG/ML IJ SOLN
10.0000 mg | INTRAMUSCULAR | Status: AC
  Administered 2018-02-12: 10 mg via INTRAVENOUS
  Filled 2018-02-12: qty 1

## 2018-02-12 MED ORDER — ONDANSETRON HCL 4 MG/2ML IJ SOLN
INTRAMUSCULAR | Status: AC
Start: 2018-02-12 — End: ?
  Filled 2018-02-12: qty 2

## 2018-02-12 MED ORDER — MELATONIN 3 MG PO TABS
3.0000 mg | ORAL_TABLET | Freq: Every day | ORAL | Status: DC
  Filled 2018-02-12 (×2): qty 2

## 2018-02-12 MED ORDER — DULOXETINE HCL 30 MG PO CPEP
120.0000 mg | ORAL_CAPSULE | Freq: Every day | ORAL | Status: DC
  Administered 2018-02-12: 120 mg via ORAL
  Filled 2018-02-12 (×2): qty 4

## 2018-02-12 SURGICAL SUPPLY — 72 items
ADH SKN CLS APL DERMABOND .7 (GAUZE/BANDAGES/DRESSINGS) ×1
APL SKNCLS STERI-STRIP NONHPOA (GAUZE/BANDAGES/DRESSINGS) ×1
BAG DECANTER FOR FLEXI CONT (MISCELLANEOUS) ×2 IMPLANT
BASKET BONE COLLECTION (BASKET) ×2 IMPLANT
BENZOIN TINCTURE PRP APPL 2/3 (GAUZE/BANDAGES/DRESSINGS) ×2 IMPLANT
BLADE CLIPPER SURG (BLADE) IMPLANT
BUR MATCHSTICK NEURO 3.0 LAGG (BURR) ×2 IMPLANT
CANISTER SUCT 3000ML PPV (MISCELLANEOUS) ×2 IMPLANT
CARTRIDGE OIL MAESTRO DRILL (MISCELLANEOUS) ×1 IMPLANT
CONT SPEC 4OZ CLIKSEAL STRL BL (MISCELLANEOUS) ×2 IMPLANT
COVER BACK TABLE 60X90IN (DRAPES) ×2 IMPLANT
DERMABOND ADVANCED (GAUZE/BANDAGES/DRESSINGS) ×1
DERMABOND ADVANCED .7 DNX12 (GAUZE/BANDAGES/DRESSINGS) ×1 IMPLANT
DIFFUSER DRILL AIR PNEUMATIC (MISCELLANEOUS) ×2 IMPLANT
DRAPE C-ARM 42X72 X-RAY (DRAPES) ×4 IMPLANT
DRAPE C-ARMOR (DRAPES) ×2 IMPLANT
DRAPE LAPAROTOMY 100X72X124 (DRAPES) ×2 IMPLANT
DRAPE POUCH INSTRU U-SHP 10X18 (DRAPES) ×2 IMPLANT
DRAPE SURG 17X23 STRL (DRAPES) ×2 IMPLANT
DRSG OPSITE POSTOP 4X6 (GAUZE/BANDAGES/DRESSINGS) ×2 IMPLANT
DURAPREP 26ML APPLICATOR (WOUND CARE) ×2 IMPLANT
ELECT BLADE 4.0 EZ CLEAN MEGAD (MISCELLANEOUS) ×2
ELECT KIT SAFEOP EMG/NMJ (ELECTRODE) ×2
ELECT REM PT RETURN 9FT ADLT (ELECTROSURGICAL) ×2
ELECTRODE BLDE 4.0 EZ CLN MEGD (MISCELLANEOUS) ×1 IMPLANT
ELECTRODE REM PT RTRN 9FT ADLT (ELECTROSURGICAL) ×1 IMPLANT
EVACUATOR 1/8 PVC DRAIN (DRAIN) IMPLANT
GAUZE SPONGE 4X4 16PLY XRAY LF (GAUZE/BANDAGES/DRESSINGS) IMPLANT
GLOVE BIO SURGEON STRL SZ7 (GLOVE) ×4 IMPLANT
GLOVE BIO SURGEON STRL SZ8 (GLOVE) ×4 IMPLANT
GLOVE BIOGEL PI IND STRL 7.0 (GLOVE) ×2 IMPLANT
GLOVE BIOGEL PI IND STRL 8 (GLOVE) ×3 IMPLANT
GLOVE BIOGEL PI INDICATOR 7.0 (GLOVE) ×2
GLOVE BIOGEL PI INDICATOR 8 (GLOVE) ×3
GLOVE ECLIPSE 7.5 STRL STRAW (GLOVE) ×2 IMPLANT
GLOVE ECLIPSE 8.0 STRL XLNG CF (GLOVE) ×6 IMPLANT
GOWN STRL REUS W/ TWL LRG LVL3 (GOWN DISPOSABLE) ×1 IMPLANT
GOWN STRL REUS W/ TWL XL LVL3 (GOWN DISPOSABLE) ×4 IMPLANT
GOWN STRL REUS W/TWL 2XL LVL3 (GOWN DISPOSABLE) IMPLANT
GOWN STRL REUS W/TWL LRG LVL3 (GOWN DISPOSABLE) ×2
GOWN STRL REUS W/TWL XL LVL3 (GOWN DISPOSABLE) ×8
HEMOSTAT POWDER KIT SURGIFOAM (HEMOSTASIS) ×2 IMPLANT
KIT BASIN OR (CUSTOM PROCEDURE TRAY) ×2 IMPLANT
KIT BONE MRW ASP ANGEL CPRP (KITS) ×2 IMPLANT
KIT EMG SRFC ELECT SAFEOP (ELECTRODE) ×1 IMPLANT
KIT TURNOVER KIT B (KITS) ×2 IMPLANT
LEAD WIRE MULTI STAGE DISP (NEUROSURGERY SUPPLIES) ×2 IMPLANT
MATRIX STRIP NEOCORE 12C (Putty) ×1 IMPLANT
MILL MEDIUM DISP (BLADE) ×2 IMPLANT
NEEDLE HYPO 18GX1.5 BLUNT FILL (NEEDLE) ×4 IMPLANT
NEEDLE HYPO 25X1 1.5 SAFETY (NEEDLE) ×2 IMPLANT
NS IRRIG 1000ML POUR BTL (IV SOLUTION) ×2 IMPLANT
OIL CARTRIDGE MAESTRO DRILL (MISCELLANEOUS) ×2
PACK LAMINECTOMY NEURO (CUSTOM PROCEDURE TRAY) ×2 IMPLANT
PAD ARMBOARD 7.5X6 YLW CONV (MISCELLANEOUS) ×6 IMPLANT
ROD PC 5.5X40 TI ARSENAL (Rod) ×4 IMPLANT
SCREW CORT CBX 5.5X40 (Screw) ×8 IMPLANT
SCREW SET SPINAL ARSENAL 47127 (Screw) ×8 IMPLANT
SPACER IDENTITI PS 10X9X25 10D (Spacer) ×4 IMPLANT
SPONGE LAP 4X18 RFD (DISPOSABLE) IMPLANT
SPONGE SURGIFOAM ABS GEL 100 (HEMOSTASIS) ×2 IMPLANT
STRIP CLOSURE SKIN 1/2X4 (GAUZE/BANDAGES/DRESSINGS) ×2 IMPLANT
STRIP MATRIX NEOCORE 12CC (Putty) ×1 IMPLANT
SUT VIC AB 0 CT1 18XCR BRD8 (SUTURE) ×1 IMPLANT
SUT VIC AB 0 CT1 8-18 (SUTURE) ×2
SUT VIC AB 2-0 CP2 18 (SUTURE) ×2 IMPLANT
SUT VIC AB 3-0 SH 8-18 (SUTURE) ×4 IMPLANT
SYR CONTROL 10ML LL (SYRINGE) ×4 IMPLANT
TOWEL GREEN STERILE (TOWEL DISPOSABLE) ×2 IMPLANT
TOWEL GREEN STERILE FF (TOWEL DISPOSABLE) ×2 IMPLANT
TRAY FOLEY MTR SLVR 16FR STAT (SET/KITS/TRAYS/PACK) ×2 IMPLANT
WATER STERILE IRR 1000ML POUR (IV SOLUTION) ×2 IMPLANT

## 2018-02-12 NOTE — Anesthesia Preprocedure Evaluation (Addendum)
Anesthesia Evaluation  Patient identified by MRN, date of birth, ID band Patient awake    Reviewed: Allergy & Precautions, NPO status , Patient's Chart, lab work & pertinent test results  History of Anesthesia Complications (+) PONV and history of anesthetic complications  Airway Mallampati: II  TM Distance: >3 FB Neck ROM: Full    Dental  (+) Teeth Intact, Chipped, Dental Advisory Given,    Pulmonary sleep apnea , former smoker,    breath sounds clear to auscultation       Cardiovascular hypertension,  Rhythm:Regular Rate:Normal     Neuro/Psych PSYCHIATRIC DISORDERS Anxiety Depression  Neuromuscular disease    GI/Hepatic Neg liver ROS, hiatal hernia, GERD  ,  Endo/Other  negative endocrine ROS  Renal/GU negative Renal ROS     Musculoskeletal  (+) Arthritis , Osteoarthritis,    Abdominal (+) + obese,   Peds  Hematology   Anesthesia Other Findings - HLD -   Reproductive/Obstetrics                            Lab Results  Component Value Date   WBC 6.5 02/03/2018   HGB 12.9 02/03/2018   HCT 41.0 02/03/2018   MCV 91.5 02/03/2018   PLT 258 02/03/2018   Lab Results  Component Value Date   CREATININE 0.62 02/03/2018   BUN 14 02/03/2018   NA 140 02/03/2018   K 4.4 02/03/2018   CL 108 02/03/2018   CO2 26 02/03/2018   Lab Results  Component Value Date   INR 0.95 02/03/2018   INR 1.10 09/16/2015   INR 1.22 06/24/2015   Echo: - Left ventricle: The cavity size was normal. Wall thickness was   increased in a pattern of mild LVH. Systolic function was normal.   The estimated ejection fraction was in the range of 50% to 55%.   Wall motion was normal; there were no regional wall motion   abnormalities. Doppler parameters are consistent with abnormal   left ventricular relaxation (grade 1 diastolic dysfunction). The   E/e&' ratio is between 8-15, suggesting indeterminate LV filling  pressure. - Mitral valve: Calcified annulus. Mildly thickened leaflets .   There was trivial regurgitation. - Left atrium: The atrium was normal in size. - Inferior vena cava: The vessel was normal in size. The   respirophasic diameter changes were in the normal range (>= 50%),   consistent with normal central venous pressure.   Anesthesia Physical Anesthesia Plan  ASA: III  Anesthesia Plan: General   Post-op Pain Management:    Induction: Intravenous  PONV Risk Score and Plan: 4 or greater and Ondansetron, Dexamethasone, Midazolam and Scopolamine patch - Pre-op  Airway Management Planned: Oral ETT  Additional Equipment: None  Intra-op Plan:   Post-operative Plan: Extubation in OR  Informed Consent: I have reviewed the patients History and Physical, chart, labs and discussed the procedure including the risks, benefits and alternatives for the proposed anesthesia with the patient or authorized representative who has indicated his/her understanding and acceptance.   Dental advisory given  Plan Discussed with: CRNA  Anesthesia Plan Comments:        Anesthesia Quick Evaluation

## 2018-02-12 NOTE — Evaluation (Signed)
Physical Therapy Evaluation Patient Details Name: Lindsay Little MRN: [id number] DOB: 12-Dec-1965 Today's Date: 02/12/2018   History of Present Illness  Pt is a 52 y/o female s/p L4-5 PLIF. PMH includes anxiety, colon cancer s/p colostomy, treatment induced neuropathy, and HTN.   Clinical Impression  Patient is s/p above surgery resulting in the deficits listed below (see PT Problem List). Pt guarded during gait with mild unsteadiness. Required min guard A for mobility tasks this session. Educated about generalized walking program and back precautions.  Patient will benefit from skilled PT to increase their independence and safety with mobility (while adhering to their precautions) to allow discharge to the venue listed below.     Follow Up Recommendations No PT follow up;Supervision for mobility/OOB    Equipment Recommendations  3in1 (PT)    Recommendations for Other Services       Precautions / Restrictions Precautions Precautions: Back Precaution Booklet Issued: Yes (comment) Precaution Comments: Reviewed back precautions with pt and pt's family.  Restrictions Weight Bearing Restrictions: No      Mobility  Bed Mobility Overal bed mobility: Needs Assistance Bed Mobility: Rolling;Sidelying to Sit;Sit to Sidelying Rolling: Min assist Sidelying to sit: Min guard     Sit to sidelying: Min assist General bed mobility comments: Min A for assist to roll onto side. Min guard for steadying throughout transfer to sitting. Required min A for LE assist to return to sidelying. Verbal cues for log roll technique.   Transfers Overall transfer level: Needs assistance Equipment used: None Transfers: Sit to/from Stand Sit to Stand: Min guard         General transfer comment: Min guard A for steadying assist. Increased time and effort to perform. Verbal cues to power through LEs.   Ambulation/Gait Ambulation/Gait assistance: Min guard Gait Distance (Feet): 175 Feet Assistive  device: IV Pole Gait Pattern/deviations: Step-through pattern;Decreased stride length Gait velocity: Decreased    General Gait Details: Slow, very guarded gait. Mild unsteadiness noted, however, no overt LOB noted. Educated about generalized walking program to perform at home.   Stairs            Wheelchair Mobility    Modified Rankin (Stroke Patients Only)       Balance Overall balance assessment: Needs assistance Sitting-balance support: No upper extremity supported;Feet supported Sitting balance-Leahy Scale: Good     Standing balance support: No upper extremity supported;During functional activity Standing balance-Leahy Scale: Fair Standing balance comment: Able to maintain static standing at sink without UE support.                              Pertinent Vitals/Pain Pain Assessment: 0-10 Pain Score: 5  Pain Location: back  Pain Descriptors / Indicators: Aching;Operative site guarding Pain Intervention(s): Limited activity within patient's tolerance;Monitored during session;Repositioned    Home Living Family/patient expects to be discharged to:: Private residence Living Arrangements: Spouse/significant other;Children Available Help at Discharge: Family;Available 24 hours/day Type of Home: House Home Access: Stairs to enter(has ramp but prefers to use steps ) Entrance Stairs-Rails: Right;Left;Can reach both Entrance Stairs-Number of Steps: 4 Home Layout: One level Home Equipment: Shower seat      Prior Function Level of Independence: Independent               Hand Dominance        Extremity/Trunk Assessment   Upper Extremity Assessment Upper Extremity Assessment: Defer to OT evaluation    Lower Extremity Assessment  Lower Extremity Assessment: RLE deficits/detail;LLE deficits/detail RLE Deficits / Details: Reports pain and numbness in RLE is better. Has neuropathy at baseline.  LLE Deficits / Details: Reports pain and numbness in  RLE is better. Has neuropathy at baseline.     Cervical / Trunk Assessment Cervical / Trunk Assessment: Other exceptions Cervical / Trunk Exceptions: s/p PLIF   Communication   Communication: No difficulties  Cognition Arousal/Alertness: Awake/alert Behavior During Therapy: WFL for tasks assessed/performed Overall Cognitive Status: Within Functional Limits for tasks assessed                                        General Comments General comments (skin integrity, edema, etc.): Pt's husband and children present during session.     Exercises     Assessment/Plan    PT Assessment Patient needs continued PT services  PT Problem List Decreased strength;Decreased balance;Decreased mobility;Impaired sensation;Decreased knowledge of precautions;Pain       PT Treatment Interventions DME instruction;Gait training;Stair training;Functional mobility training;Therapeutic activities;Therapeutic exercise;Balance training;Patient/family education    PT Goals (Current goals can be found in the Care Plan section)  Acute Rehab PT Goals Patient Stated Goal: "to go home"  PT Goal Formulation: With patient Time For Goal Achievement: 02/26/18 Potential to Achieve Goals: Good    Frequency Min 5X/week   Barriers to discharge        Co-evaluation               AM-PAC PT "6 Clicks" Daily Activity  Outcome Measure Difficulty turning over in bed (including adjusting bedclothes, sheets and blankets)?: Unable Difficulty moving from lying on back to sitting on the side of the bed? : Unable Difficulty sitting down on and standing up from a chair with arms (e.g., wheelchair, bedside commode, etc,.)?: Unable Help needed moving to and from a bed to chair (including a wheelchair)?: A Little Help needed walking in hospital room?: A Little Help needed climbing 3-5 steps with a railing? : A Little 6 Click Score: 12    End of Session Equipment Utilized During Treatment: Gait  belt Activity Tolerance: Patient tolerated treatment well Patient left: in bed;with call bell/phone within reach;with family/visitor present Nurse Communication: Mobility status;Other (comment)(pt voided ) PT Visit Diagnosis: Other abnormalities of gait and mobility (R26.89);Pain Pain - part of body: (back)    Time: 3785-8850 PT Time Calculation (min) (ACUTE ONLY): 20 min   Charges:   PT Evaluation $PT Eval Low Complexity: 1 Low          Leighton Ruff, PT, DPT  Acute Rehabilitation Services  Pager: [phone]   Rudean Hitt 02/12/2018, 3:36 PM

## 2018-02-12 NOTE — H&P (Signed)
Subjective: Patient is a 52 y.o. female admitted for PLIF. Onset of symptoms was several months ago, gradually worsening since that time.  The pain is rated severe, and is located at the across the lower back and radiates to legs. The pain is described as aching and occurs all day. The symptoms have been progressive. Symptoms are exacerbated by exercise. MRI or CT showed PLIF L4-5   Past Medical History:  Diagnosis Date  . Anxiety   . Chemotherapy-induced neuropathy (HCC)    toes and fingers numbness and tingling  . Colostomy in place [facility]) 07/12/2016   due to anastomosis breakdown w/ colovaginal fistula  . Complication of anesthesia   . Depression   . Family history of adverse reaction to anesthesia    mother-- ponv  . Family history of breast cancer   . Family history of pancreatic cancer   . Family history of skin cancer   . Gastroenteritis 12/20/2015  . GERD (gastroesophageal reflux disease)   . Hiatal hernia   . History of cancer chemotherapy 02-20-2015 to 03-30-2015  . History of cardiac murmur as a child   . History of chemotherapy   . History of chronic gastritis   . History of hypertension    no issues since multiple abdminal sx's and chemo--- no medication since 12/ 2016  . History of TMJ disorder   . Hypercholesteremia   . IBS (irritable bowel syndrome)    dx age 7  . Intermittent abdominal pain    post-op multiple abdominal sx's   . Microcytic anemia   . Mild sleep apnea    per pt study 2014  very mild osa , no cpap recommended, recommeded wt loss and sleep routine  . OA (osteoarthritis)    left knee /  left shoulder  . Obesity   . PONV (postoperative nausea and vomiting)    severe" needs Scopolamine PATCH"   . Portacath in place    right chest  . Rectal adenocarcinoma ([place]) oncologis-  dr Burr Medico-- after radiation/ chemo (ypT1, N0) --  no recurrence per last note 03/ 2018   dx 01-13-2015-- Stage IIIC (T3, N2, M0) post concurrent radiation and chemotherapy  02-20-2015 to 03-30-2015 /  05-25-2015 s/p  LAR w/ RSO (post-op complicated by late abscess and contained anatomotic leak w/ help percutaneous drainage and antibiotics)   . Rotator cuff tear, left   . S/P radiation therapy 02/20/15-03/30/15   colon/rectal  . Vitamin D deficiency   . Wears glasses   . Wears glasses     Past Surgical History:  Procedure Laterality Date  . ABDOMINAL HYSTERECTOMY  1996   uterus and cervix  . COLON RESECTION N/A 07/12/2016   Procedure: LAPAROSCOPIC LYSIS OF ADHESIONS, OMENTOPEXY, HAND ASSISTED RESECTION OF  COLON, END TO END ANASTOMOSIS, COLOSTOMY;  Surgeon: Michael Boston, MD;  Location: [facility];  Service: General;  Laterality: N/A;  . COMBINED HYSTEROSCOPY DIAGNOSTIC / D&C  x2 1990's  . DIAGNOSTIC LAPAROSCOPY  age 89 and age 40  . EUS N/A 01/18/2015   Procedure: LOWER ENDOSCOPIC ULTRASOUND (EUS);  Surgeon: Arta Silence, MD;  Location: [facility];  Service: Endoscopy;  Laterality: N/A;  . EXCISION OF SKIN TAG  11/17/2015   Procedure: EXCISION OF SKIN TAG;  Surgeon: Michael Boston, MD;  Location: [facility];  Service: General;;  . ILEO LOOP COLOSTOMY CLOSURE N/A 11/17/2015   Procedure: LAPAROSCOPIC DIVERTING LOOP ILEOSTOMY  DRAINAGE OF PELVIC ABSCESS;  Surgeon: Michael Boston, MD;  Location: [facility];  Service: General;  Laterality: N/A;  . ILEOSTOMY CLOSURE N/A 05/31/2016   Procedure: TAKEDOWN LOOP ILEOSTOMY;  Surgeon: Michael Boston, MD;  Location: [facility];  Service: General;  Laterality: N/A;  . IMPACTION REMOVAL  11/17/2015   Procedure: DISIMPACTION REMOVAL;  Surgeon: Michael Boston, MD;  Location: [facility];  Service: General;;  . KNEE ARTHROSCOPY Left 1990's  . KNEE ARTHROSCOPY W/ MENISCECTOMY Left 09/14/2009   and chondroplasty debridement  . LAPAROSCOPIC LYSIS OF ADHESIONS  11/17/2015   Procedure: LAPAROSCOPIC LYSIS OF ADHESIONS;  Surgeon: Michael Boston, MD;  Location: [facility];  Service: General;;  . PORT-A-CATH REMOVAL N/A 11/21/2016   Procedure: REMOVAL PORT-A-CATH;  Surgeon:  Michael Boston, MD;  Location: [facility];  Service: General;  Laterality: N/A;  . PORTACATH PLACEMENT N/A 05/25/2015   Procedure: INSERTION PORT-A-CATH;  Surgeon: Michael Boston, MD;  Location: [facility];  Service: General;  Laterality: N/A;-remains inplace Right chest.  . ROTATOR CUFF REPAIR Right 2006  . TONSILLECTOMY  age 64  . XI ROBOTIC ASSISTED LOWER ANTERIOR RESECTION N/A 05/25/2015   Procedure: XI ROBOTIC ASSISTED LOWER ANTERIOR RESECTION, , RIGID PROCTOSCOPY, RIGHT OOPHORECTOMY;  Surgeon: Michael Boston, MD;  Location: [facility];  Service: General;  Laterality: N/A;    Prior to Admission medications   Medication Sig Start Date End Date Taking? Authorizing Provider  Cholecalciferol (VITAMIN D3) 5000 units CAPS Take 5,000 Units by mouth at bedtime.    Yes [provider]  Coenzyme Q10 (COQ10) 200 MG CAPS Take 200 mg by mouth at bedtime.   Yes [provider]  DULoxetine (CYMBALTA) 60 MG capsule Take 120 mg by mouth at bedtime.    Yes [provider]  esomeprazole (NEXIUM) 40 MG capsule Take 1 capsule (40 mg total) by mouth 2 (two) times daily before a meal. 12/22/15  Yes Gross, Remo Lipps, MD  furosemide (LASIX) 20 MG tablet Take 20 mg by mouth See admin instructions. Take 20 mg by mouth up to twice weekly as needed for swelling   Yes [provider]  gabapentin (NEURONTIN) 300 MG capsule Take 300-600 mg by mouth See admin instructions. Take 300 mg in the morning and afternoon and 600 mg at night   Yes [provider]  HYDROcodone-acetaminophen (NORCO/VICODIN) 5-325 MG tablet Take 1 tablet by mouth every 6 (six) hours as needed for moderate pain. 12/27/16  Yes Truitt Merle, MD  Melatonin 3 MG TABS Take 3-6 mg by mouth at bedtime.   Yes [provider]  meloxicam (MOBIC) 15 MG tablet Take 15 mg by mouth at bedtime.    Yes [provider]  methocarbamol (ROBAXIN) 500 MG tablet Take 2 tablets (1,000 mg total) by mouth every 6 (six)  hours as needed for muscle spasms. 09/17/15  Yes Michael Boston, MD  nebivolol (BYSTOLIC) 5 MG tablet Take 5 mg by mouth at bedtime.   Yes [provider]  ondansetron (ZOFRAN) 8 MG tablet Take 1 tablet by mouth as needed. 05/13/16  Yes [provider]  OVER THE COUNTER MEDICATION Place 10 drops under the tongue 2 (two) times daily. CBD oil   Yes [provider]  Phenazopyridine HCl (AZO-STANDARD PO) Place 1 tablet vaginally at bedtime. Azo vaginal probiotic   Yes [provider]  PREMARIN vaginal cream Place 3.30 applicators vaginally 3 (three) times a week. 01/12/18  Yes [provider]  rosuvastatin (CRESTOR) 5 MG tablet Take 5 mg by mouth at bedtime.   Yes [provider]  traMADol (ULTRAM) 50 MG tablet Take  50 mg by mouth every 6 (six) hours as needed for moderate pain.  12/25/16  Yes [provider]  triamcinolone lotion (KENALOG) 0.1 % Apply 1 application topically as needed (dry skin).   Yes [provider]   Allergies  Allergen Reactions  . Caine-1 [Lidocaine] Swelling and Rash    Eyes swell shut; includes all caine drugs except marcaine. EMLA cream OK though (?!)  . Flagyl [Metronidazole] Nausea Only  . Adhesive [Tape] Other (See Comments)    Blisters - can use paper tape  . Iron Nausea Only  . Oxycodone     NIGHTMARES. (tolerates hydrocodone or tramadol better)  . Penicillins Rash    Has patient had a PCN reaction causing immediate rash, facial/tongue/throat swelling, SOB or lightheadedness with hypotension: no Has patient had a PCN reaction causing severe rash involving mucus membranes or skin necrosis: no Has patient had a PCN reaction that required hospitalization no Has patient had a PCN reaction occurring within the last 10 years: no If all of the above answers are "NO", then may proceed with Cephalosporin use.   . Sulfa Antibiotics Nausea And Vomiting and Rash    Social History   Tobacco Use  . Smoking  status: Former Smoker    Packs/day: 0.25    Years: 7.00    Pack years: 1.75    Types: Cigarettes    Last attempt to quit: 07/16/1991    Years since quitting: 26.5  . Smokeless tobacco: Never Used  Substance Use Topics  . Alcohol use: No    Family History  Problem Relation Age of Onset  . Coronary artery disease Mother 33  . Hypertension Mother   . Hyperlipidemia Mother   . Diabetes Mellitus I Mother   . Coronary artery disease Father   . Hyperlipidemia Father   . Hypertension Father   . Cancer Sister        skin - non melanoma  . Hyperlipidemia Brother   . Cancer Maternal Uncle 49       pancreatic with mets to colon and prostate  . Cirrhosis Maternal Uncle   . Hypertension Maternal Grandmother   . Diabetes Mellitus I Maternal Grandmother   . Hyperlipidemia Maternal Grandmother   . CVA Maternal Grandmother   . Hypertension Maternal Grandfather   . Coronary artery disease Maternal Grandfather 65  . Hyperlipidemia Maternal Grandfather   . Coronary artery disease Paternal Grandmother   . Hypertension Paternal Grandmother   . Hyperlipidemia Paternal Grandmother   . Diabetes Mellitus I Paternal Grandmother   . Hypertension Paternal Grandfather   . Hyperlipidemia Paternal Grandfather   . Coronary artery disease Paternal Grandfather      Review of Systems  Positive ROS: neg  All other systems have been reviewed and were otherwise negative with the exception of those mentioned in the HPI and as above.  Objective: Vital signs in last 24 hours: Temp:  [98.9 F (37.2 C)] 98.9 F (37.2 C) (08/01 0545) Pulse Rate:  [64] 64 (08/01 0545) Resp:  [18] 18 (08/01 0545) BP: (155-175)/(71-74) 155/71 (08/01 0550) SpO2:  [100 %] 100 % (08/01 0545) Weight:  [146.5 kg (323 lb)] 146.5 kg (323 lb) (08/01 9147)  General Appearance: Alert, cooperative, no distress, appears stated age Head: Normocephalic, without obvious abnormality, atraumatic Eyes: PERRL, conjunctiva/corneas clear,  EOM's intact    Neck: Supple, symmetrical, trachea midline Back: Symmetric, no curvature, ROM normal, no CVA tenderness Lungs:  respirations unlabored Heart: Regular rate and rhythm Abdomen: Soft, non-tender Extremities:  Extremities normal, atraumatic, no cyanosis or edema Pulses: 2+ and symmetric all extremities Skin: Skin color, texture, turgor normal, no rashes or lesions  NEUROLOGIC:   Mental status: Alert and oriented x4,  no aphasia, good attention span, fund of knowledge, and memory Motor Exam - grossly normal Sensory Exam - grossly normal Reflexes: 1+ Coordination - grossly normal Gait - grossly normal Balance - grossly normal Cranial Nerves: I: smell Not tested  II: visual acuity  OS: nl    OD: nl  II: visual fields Full to confrontation  II: pupils Equal, round, reactive to light  III,VII: ptosis None  III,IV,VI: extraocular muscles  Full ROM  V: mastication Normal  V: facial light touch sensation  Normal  V,VII: corneal reflex  Present  VII: facial muscle function - upper  Normal  VII: facial muscle function - lower Normal  VIII: hearing Not tested  IX: soft palate elevation  Normal  IX,X: gag reflex Present  XI: trapezius strength  5/5  XI: sternocleidomastoid strength 5/5  XI: neck flexion strength  5/5  XII: tongue strength  Normal    Data Review Lab Results  Component Value Date   WBC 6.5 02/03/2018   HGB 12.9 02/03/2018   HCT 41.0 02/03/2018   MCV 91.5 02/03/2018   PLT 258 02/03/2018   Lab Results  Component Value Date   NA 140 02/03/2018   K 4.4 02/03/2018   CL 108 02/03/2018   CO2 26 02/03/2018   BUN 14 02/03/2018   CREATININE 0.62 02/03/2018   GLUCOSE 98 02/03/2018   Lab Results  Component Value Date   INR 0.95 02/03/2018    Assessment/Plan:  Estimated body mass index is 46.35 kg/m as calculated from the following:   Height as of this encounter: 5' 10"  (1.778 m).   Weight as of this encounter: 146.5 kg (323 lb). Patient admitted  for PLIF L4-5. Patient has failed a reasonable attempt at conservative therapy.  Morbid obesity  I explained the condition and procedure to the patient and answered any questions.  Patient wishes to proceed with procedure as planned. Understands risks/ benefits and typical outcomes of procedure.   Claxton Levitz S 02/12/2018 7:06 AM

## 2018-02-12 NOTE — Progress Notes (Signed)
Pharmacy Antibiotic Note  Lindsay Little is a 52 y.o. female admitted on 02/12/2018 for planned spinal surgery. Pharmacy has been consulted for Vancomycin dosing post-op. Per RN - no drain is in place. Pre-op dose given at 0630.   Plan: - Vancomycin 1g x 1 dose post-op at 1830 - Pharmacy will sign off as no further doses expected at this time.   Height: 5' 10"  (177.8 cm) Weight: (!) 323 lb (146.5 kg) IBW/kg (Calculated) : 68.5  Temp (24hrs), Avg:98.1 F (36.7 C), Min:97.7 F (36.5 C), Max:98.9 F (37.2 C)  No results for input(s): WBC, CREATININE, LATICACIDVEN, VANCOTROUGH, VANCOPEAK, VANCORANDOM, GENTTROUGH, GENTPEAK, GENTRANDOM, TOBRATROUGH, TOBRAPEAK, TOBRARND, AMIKACINPEAK, AMIKACINTROU, AMIKACIN in the last 168 hours.  Estimated Creatinine Clearance: 130.9 mL/min (by C-G formula based on SCr of 0.62 mg/dL).    Allergies  Allergen Reactions  . Caine-1 [Lidocaine] Swelling and Rash    Eyes swell shut; includes all caine drugs except marcaine. EMLA cream OK though (?!)  . Flagyl [Metronidazole] Nausea Only  . Adhesive [Tape] Other (See Comments)    Blisters - can use paper tape  . Iron Nausea Only  . Oxycodone     NIGHTMARES. (tolerates hydrocodone or tramadol better)  . Penicillins Rash    Has patient had a PCN reaction causing immediate rash, facial/tongue/throat swelling, SOB or lightheadedness with hypotension: no Has patient had a PCN reaction causing severe rash involving mucus membranes or skin necrosis: no Has patient had a PCN reaction that required hospitalization no Has patient had a PCN reaction occurring within the last 10 years: no If all of the above answers are "NO", then may proceed with Cephalosporin use.   . Sulfa Antibiotics Nausea And Vomiting and Rash     Thank you for allowing pharmacy to be a part of this patient's care.  Alycia Rossetti, PharmD, BCPS Pager: [phone] 1:24 PM

## 2018-02-12 NOTE — Op Note (Signed)
02/12/2018  11:05 AM  PATIENT:  Lindsay Little  52 y.o. female  PRE-OPERATIVE DIAGNOSIS:  Generative spondylolisthesis L4-5 with severe spinal stenosis, back and leg pain  POST-OPERATIVE DIAGNOSIS:  same  PROCEDURE:   1. Decompressive lumbar laminectomy L4-5 requiring more work than would be required for a simple exposure of the disk for PLIF in order to adequately decompress the neural elements and address the spinal stenosis 2. Posterior lumbar interbody fusion L4-5 using porous titanium interbody cages packed with morcellized allograft and autograft 3. Posterior fixation L4-5 using Alphateccortical pedicle screws.  4. Intertransverse arthrodesis L4-5 using morcellized autograft and allograft.  SURGEON:  Sherley Bounds, MD  ASSISTANTS: Dr. Sherwood Gambler  ANESTHESIA:  General  EBL: 250 ml  Total I/O In: 1500 [I.V.:1500] Out: 900 [Urine:650; Blood:250]  BLOOD ADMINISTERED:none  DRAINS: none   INDICATION FOR PROCEDURE: This patient presented withsevere back and bilateral leg pain. Imaging revealed spondylolisthesis L4-5 with severe spinal stenosis and focal kyphosis. The patient tried a reasonable attempt at conservative medical measures without relief. I recommended decompression and instrumented fusion to address the stenosis as well as the segmental  instability.  Patient understood the risks, benefits, and alternatives and potential outcomes and wished to proceed.  PROCEDURE DETAILS:  The patient was brought to the operating room. After induction of generalized endotracheal anesthesia the patient was rolled into the prone position on chest rolls and all pressure points were padded. The patient's lumbar region was cleaned and then prepped with DuraPrep and draped in the usual sterile fashion. Anesthesia was injected and then a dorsal midline incision was made and carried down to the lumbosacral fascia. The fascia was opened and the paraspinous musculature was taken down in a  subperiosteal fashion to expose L4-5. A self-retaining retractor was placed. Intraoperative fluoroscopy confirmed my level, and I started with placement of the L4 cortical pedicle screws. The pedicle screw entry zones were identified utilizing surface landmarks and  AP and lateral fluoroscopy. I scored the cortex with the high-speed drill and then used the hand drill to drill an upward and outward direction into the pedicle. I then tapped line to line. I then placed a 5.5 x 40 mm cortical pedicle screw into the pedicles of L4 bilaterally. I then turned my attention to the decompression and complete lumbar laminectomies, hemi- facetectomies, and foraminotomies were performed at L4-5. The patient had significant spinal stenosis and this required more work than would be required for a simple exposure of the disc for posterior lumbar interbody fusion which would only require a limited laminotomy. Much more generous decompression and generous foraminotomy was undertaken in order to adequately decompress the neural elements and address the patient's leg pain. The yellow ligament was removed to expose the underlying dura and nerve roots, and generous foraminotomies were performed to adequately decompress the neural elements. Both the exiting and traversing nerve roots were decompressed on both sides until a coronary dilator passed easily along the nerve roots. Once the decompression was complete, I turned my attention to the posterior lower lumbar interbody fusion. The epidural venous vasculature was coagulated and cut sharply. Disc space was incised and the initial discectomy was performed with pituitary rongeurs. The disc space was distracted with sequential distractors to a height of 12 mm. We then used a series of scrapers and shavers to prepare the endplates for fusion. The midline was prepared with Epstein curettes. Once the complete discectomy was finished, we packed an appropriate sized interbody cage with local  autograft and morcellized  allograft, gently retracted the nerve root, and tapped the cage into position at L4-5.  The midline between the cages was packed with morselized autograft and allograft. We then turned our attention to the placement of the lower pedicle screws. The pedicle screw entry zones were identified utilizing surface landmarks and fluoroscopy. I drilled into each pedicle utilizing the hand drill, and tapped each pedicle with the appropriate tap. We palpated with a ball probe to assure no break in the cortex. We then placed 5.5 x 40 mm cortical pedicle screws into the pedicles bilaterally at L5. We then decorticated the transverse processes and laid a mixture of morcellized autograft and allograft out over these to perform intertransverse arthrodesis at L4-5. We then placed lordotic rods into the multiaxial screw heads of the pedicle screws and locked these in position with the locking caps and anti-torque device. We then checked our construct with AP and lateral fluoroscopy. Irrigated with copious amounts of bacitracin-containing saline solution. Inspected the nerve roots once again to assure adequate decompression, lined to the dura with Gelfoam, placed powdered vancomycin into the wound, and closed the muscle and the fascia with 0 Vicryl. Closed the subcutaneous tissues with 2-0 Vicryl and subcuticular tissues with 3-0 Vicryl. The skin was closed with benzoin and Steri-Strips. Dressing was then applied, the patient was awakened from general anesthesia and transported to the recovery room in stable condition. At the end of the procedure all sponge, needle and instrument counts were correct.   PLAN OF CARE: admit to inpatient  PATIENT DISPOSITION:  PACU - hemodynamically stable.   Delay start of Pharmacological VTE agent (>24hrs) due to surgical blood loss or risk of bleeding:  yes

## 2018-02-12 NOTE — Anesthesia Procedure Notes (Signed)
Procedure Name: Intubation Date/Time: 02/12/2018 7:38 AM Performed by: Lowella Dell, CRNA Pre-anesthesia Checklist: Patient identified, Emergency Drugs available, Suction available and Patient being monitored Patient Re-evaluated:Patient Re-evaluated prior to induction Oxygen Delivery Method: Circle System Utilized Preoxygenation: Pre-oxygenation with 100% oxygen Induction Type: IV induction Ventilation: Mask ventilation without difficulty Laryngoscope Size: Mac and 3 Grade View: Grade I Tube type: Oral Tube size: 7.0 mm Number of attempts: 1 Airway Equipment and Method: Stylet and Oral airway Placement Confirmation: ETT inserted through vocal cords under direct vision,  positive ETCO2 and breath sounds checked- equal and bilateral Secured at: 21 cm Tube secured with: Tape Dental Injury: Teeth and Oropharynx as per pre-operative assessment

## 2018-02-12 NOTE — Transfer of Care (Signed)
Immediate Anesthesia Transfer of Care Note  Patient: Lindsay Little  Procedure(s) Performed: Posterior Lumbar Interbody Fusion - Lumbar Four - Lumbar Five (N/A Spine Lumbar)  Patient Location: PACU  Anesthesia Type:General  Level of Consciousness: oriented, drowsy and patient cooperative  Airway & Oxygen Therapy: Patient Spontanous Breathing and Patient connected to nasal cannula oxygen  Post-op Assessment: Report given to RN and Post -op Vital signs reviewed and stable  Post vital signs: Reviewed  Last Vitals:  Vitals Value Taken Time  BP 129/72 02/12/2018 11:14 AM  Temp    Pulse 71 02/12/2018 11:15 AM  Resp 19 02/12/2018 11:15 AM  SpO2 95 % 02/12/2018 11:15 AM  Vitals shown include unvalidated device data.  Last Pain:  Vitals:   02/12/18 0642  TempSrc:   PainSc: 5       Patients Stated Pain Goal: 4 (18/86/77 3736)  Complications: No apparent anesthesia complications

## 2018-02-12 NOTE — Anesthesia Postprocedure Evaluation (Signed)
Anesthesia Post Note  Patient: Lindsay Little  Procedure(s) Performed: Posterior Lumbar Interbody Fusion - Lumbar Four - Lumbar Five (N/A Spine Lumbar)     Patient location during evaluation: PACU Anesthesia Type: General Level of consciousness: awake and alert Pain management: pain level controlled Vital Signs Assessment: post-procedure vital signs reviewed and stable Respiratory status: spontaneous breathing, nonlabored ventilation, respiratory function stable and patient connected to nasal cannula oxygen Cardiovascular status: blood pressure returned to baseline and stable Postop Assessment: no apparent nausea or vomiting Anesthetic complications: no    Last Vitals:  Vitals:   02/12/18 1245 02/12/18 1311  BP: (!) 124/96 (!) 117/57  Pulse: 74 74  Resp: 12 16  Temp: 36.5 C 36.7 C  SpO2: 95% 92%    Last Pain:  Vitals:   02/12/18 1311  TempSrc: Oral  PainSc:                  Effie Berkshire

## 2018-02-12 NOTE — Plan of Care (Signed)
  Problem: Safety: Goal: Ability to remain free from injury will improve Outcome: Progressing   

## 2018-02-13 ENCOUNTER — Ambulatory Visit: Payer: Self-pay | Admitting: Surgery

## 2018-02-13 MED ORDER — HYDROCODONE-ACETAMINOPHEN 5-325 MG PO TABS: 1.0000 | ORAL_TABLET | Freq: Four times a day (QID) | ORAL | 0 refills | Status: DC | PRN

## 2018-02-13 MED ORDER — METHOCARBAMOL 500 MG PO TABS: 1000.0000 mg | ORAL_TABLET | Freq: Four times a day (QID) | ORAL | 3 refills | Status: DC | PRN

## 2018-02-13 MED FILL — Thrombin (Recombinant) For Soln 20000 Unit: CUTANEOUS | Qty: 1 | Status: AC

## 2018-02-13 NOTE — Progress Notes (Addendum)
Physical Therapy Treatment and Discharge Patient Details Name: Lindsay Little MRN: [id number] DOB: Dec 19, 1965 Today's Date: 02/13/2018    History of Present Illness Pt is a 52 y/o female s/p L4-5 PLIF. PMH includes anxiety, colon cancer s/p colostomy, treatment induced neuropathy, and HTN.     PT Comments    Patient met all of her physical therapy goals during her inpatient stay. Ambulating independently with hallway distances. Session focused on stair training; patient able to negotiate 4 steps with railings and supervision to prepare for discharge home. Demonstrated/instructed patient on safe car transfer and reviewed precautions.  Patient has no further questions/concerns or acute PT needs. Signing off.  Follow Up Recommendations  No PT follow up     Equipment Recommendations  3in1 (PT)    Recommendations for Other Services       Precautions / Restrictions Precautions Precautions: Back Precaution Booklet Issued: No(issued by prior PT) Precaution Comments: Reviewed back precautions with pt and pt's family.  Restrictions Weight Bearing Restrictions: No    Mobility  Bed Mobility Overal bed mobility: Needs Assistance Bed Mobility: Rolling;Sidelying to Sit Rolling: Supervision Sidelying to sit: Supervision       General bed mobility comments: Received ambulating in hallway  Transfers Overall transfer level: Independent Equipment used: None Transfers: Sit to/from Stand Sit to Stand: Supervision         General transfer comment: Independent with stand to sit transition  Ambulation/Gait Ambulation/Gait assistance: Independent   Assistive device: None Gait Pattern/deviations: Step-through pattern;Wide base of support     General Gait Details: Patient received ambulating independently in hallway. No unsteadiness noted and adequate gait speed and posture   Stairs Stairs: Yes Stairs assistance: Supervision Stair Management: Two rails Number of Stairs:  4 General stair comments: Step by step pattern   Wheelchair Mobility    Modified Rankin (Stroke Patients Only)       Balance Overall balance assessment: Needs assistance Sitting-balance support: No upper extremity supported;Feet supported Sitting balance-Leahy Scale: Good     Standing balance support: No upper extremity supported;During functional activity Standing balance-Leahy Scale: Good Standing balance comment: within base of support during functional tasks                             Cognition Arousal/Alertness: Awake/alert Behavior During Therapy: WFL for tasks assessed/performed Overall Cognitive Status: Within Functional Limits for tasks assessed                                        Exercises      General Comments        Pertinent Vitals/Pain Pain Assessment: Faces Faces Pain Scale: Hurts a little bit Pain Location: back  Pain Descriptors / Indicators: Aching;Operative site guarding;Sharp Pain Intervention(s): Monitored during session    Home Living Family/patient expects to be discharged to:: Private residence Living Arrangements: Spouse/significant other;Children Available Help at Discharge: Family;Available 24 hours/day Type of Home: House Home Access: Stairs to enter Entrance Stairs-Rails: Right;Left;Can reach both Home Layout: One level Home Equipment: Shower seat      Prior Function Level of Independence: Independent      Comments: was independent ADLs, did not complete IADLs (spouse/kids assisted), still driving but not working   PT Goals (current goals can now be found in the care plan section) Acute Rehab PT Goals Patient Stated Goal: "to go home"  Progress towards PT goals: Goals met/education completed, patient discharged from PT    Frequency    Min 5X/week      PT Plan Other (comment)(d/c therapies)    Co-evaluation              AM-PAC PT "6 Clicks" Daily Activity  Outcome Measure   Difficulty turning over in bed (including adjusting bedclothes, sheets and blankets)?: None Difficulty moving from lying on back to sitting on the side of the bed? : None Difficulty sitting down on and standing up from a chair with arms (e.g., wheelchair, bedside commode, etc,.)?: None Help needed moving to and from a bed to chair (including a wheelchair)?: None Help needed walking in hospital room?: None Help needed climbing 3-5 steps with a railing? : A Little 6 Click Score: 23    End of Session   Activity Tolerance: Patient tolerated treatment well Patient left: Other (comment);with call bell/phone within reach;with family/visitor present(seated EOB) Nurse Communication: Mobility status PT Visit Diagnosis: Other abnormalities of gait and mobility (R26.89);Pain Pain - part of body: (back)     Time: 0812-0820 PT Time Calculation (min) (ACUTE ONLY): 8 min  Charges:  $Therapeutic Activity: 8-22 mins                     Ellamae Sia, PT, DPT Acute Rehabilitation Services  Pager: [phone]    Willy Eddy 02/13/2018, 10:56 AM

## 2018-02-13 NOTE — Progress Notes (Signed)
Patient is discharged from room 3C11 at this time. Alert and in stable condition. IV site d/c'd and instructions read to patient and family with understanding verbalized. Left unit via wheelchair with all belongings at side.

## 2018-02-13 NOTE — Discharge Summary (Signed)
Physician Discharge Summary  [name] MRN: [id number] DOB/AGE: 11/30/1965 52 y.o.  Admit date: 02/12/2018 Discharge date: 02/13/2018  Admission Diagnoses: spondylolisthesis stenosis L4-5    Discharge Diagnoses: same   Discharged Condition: good  Hospital Course: The patient was admitted on 02/12/2018 and taken to the operating room where the patient underwent PLIF L4-5. The patient tolerated the procedure well and was taken to the recovery room and then to the floor in stable condition. The hospital course was routine. There were no complications. The wound remained clean dry and intact. Pt had appropriate back soreness. No complaints of leg pain or new N/T/W. The patient remained afebrile with stable vital signs, and tolerated a regular diet. The patient continued to increase activities, and pain was well controlled with oral pain medications.   Consults: None  Significant Diagnostic Studies:  Results for orders placed or performed during the hospital encounter of 02/03/18  Surgical pcr screen  Result Value Ref Range   MRSA, PCR NEGATIVE NEGATIVE   Staphylococcus aureus NEGATIVE NEGATIVE  Basic metabolic panel  Result Value Ref Range   Sodium 140 135 - 145 mmol/L   Potassium 4.4 3.5 - 5.1 mmol/L   Chloride 108 98 - 111 mmol/L   CO2 26 22 - 32 mmol/L   Glucose, Bld 98 70 - 99 mg/dL   BUN 14 6 - 20 mg/dL   Creatinine, Ser 0.62 0.44 - 1.00 mg/dL   Calcium 9.7 8.9 - 10.3 mg/dL   GFR calc non Af Amer >60 >60 mL/min   GFR calc Af Amer >60 >60 mL/min   Anion gap 6 5 - 15  CBC WITH DIFFERENTIAL  Result Value Ref Range   WBC 6.5 4.0 - 10.5 K/uL   RBC 4.48 3.87 - 5.11 MIL/uL   Hemoglobin 12.9 12.0 - 15.0 g/dL   HCT 41.0 36.0 - 46.0 %   MCV 91.5 78.0 - 100.0 fL   MCH 28.8 26.0 - 34.0 pg   MCHC 31.5 30.0 - 36.0 g/dL   RDW 13.3 11.5 - 15.5 %   Platelets 258 150 - 400 K/uL   Neutrophils Relative % 73 %   Neutro Abs 4.7 1.7 - 7.7 K/uL   Lymphocytes Relative 18 %    Lymphs Abs 1.2 0.7 - 4.0 K/uL   Monocytes Relative 6 %   Monocytes Absolute 0.4 0.1 - 1.0 K/uL   Eosinophils Relative 2 %   Eosinophils Absolute 0.1 0.0 - 0.7 K/uL   Basophils Relative 1 %   Basophils Absolute 0.1 0.0 - 0.1 K/uL   Immature Granulocytes 0 %   Abs Immature Granulocytes 0.0 0.0 - 0.1 K/uL  Protime-INR  Result Value Ref Range   Prothrombin Time 12.6 11.4 - 15.2 seconds   INR 0.95   Type and screen  Result Value Ref Range   ABO/RH(D) O POS    Antibody Screen NEG    Sample Expiration 02/17/2018    Extend sample reason      NO TRANSFUSIONS OR PREGNANCY IN THE PAST 3 MONTHS Performed at [facility], [address], [place], [place] [address]   ABO/Rh  Result Value Ref Range   ABO/RH(D)      O POS Performed at [facility] [address], [place], [place] [address]     Chest 2 View  Result Date: 02/03/2018 CLINICAL DATA:  Lumbar surgery. EXAM: CHEST - 2 VIEW COMPARISON:  CT 12/09/2017. FINDINGS: Mediastinum and hilar structures normal. Lungs are  clear. No pleural effusion pneumothorax. Heart size normal. Thoracic spine scoliosis and degenerative change. IMPRESSION: No acute cardiopulmonary disease. Thoracic spine scoliosis and degenerative change. Electronically Signed   By: Marcello Moores  Register   On: 02/03/2018 13:41   Dg Lumbar Spine 2-3 Views  Result Date: 02/12/2018 CLINICAL DATA:  Low back pain. EXAM: LUMBAR SPINE - 2-3 VIEW; DG C-ARM 61-120 MIN COMPARISON:  MRI 11/25/2017. FINDINGS: Intraoperative radiographs demonstrate L4-5 PLIF. Improved position and alignment. Mild L4-5 anterolisthesis redemonstrated. IMPRESSION: Satisfactory L4-5 PLIF. Electronically Signed   By: Staci Righter M.D.   On: 02/12/2018 11:07   Dg C-arm 1-60 Min  Result Date: 02/12/2018 CLINICAL DATA:  Low back pain. EXAM: LUMBAR SPINE - 2-3 VIEW; DG C-ARM 61-120 MIN COMPARISON:  MRI 11/25/2017. FINDINGS: Intraoperative radiographs demonstrate L4-5 PLIF. Improved position and  alignment. Mild L4-5 anterolisthesis redemonstrated. IMPRESSION: Satisfactory L4-5 PLIF. Electronically Signed   By: Staci Righter M.D.   On: 02/12/2018 11:07   Dg C-arm 1-60 Min  Result Date: 02/12/2018 CLINICAL DATA:  Low back pain. EXAM: LUMBAR SPINE - 2-3 VIEW; DG C-ARM 61-120 MIN COMPARISON:  MRI 11/25/2017. FINDINGS: Intraoperative radiographs demonstrate L4-5 PLIF. Improved position and alignment. Mild L4-5 anterolisthesis redemonstrated. IMPRESSION: Satisfactory L4-5 PLIF. Electronically Signed   By: Staci Righter M.D.   On: 02/12/2018 11:07    Antibiotics:  Anti-infectives (From admission, onward)   Start     Dose/Rate Route Frequency Ordered Stop   02/12/18 1830  vancomycin (VANCOCIN) IVPB 1000 mg/200 mL premix     1,000 mg 200 mL/hr over 60 Minutes Intravenous  Once 02/12/18 1324 02/12/18 1944   02/12/18 0845  vancomycin (VANCOCIN) powder  Status:  Discontinued       As needed 02/12/18 0845 02/12/18 1108   02/12/18 0841  bacitracin 50,000 Units in sodium chloride 0.9 % 500 mL irrigation  Status:  Discontinued       As needed 02/12/18 0842 02/12/18 1108   02/12/18 0600  vancomycin (VANCOCIN) 1,500 mg in sodium chloride 0.9 % 500 mL IVPB     1,500 mg 250 mL/hr over 120 Minutes Intravenous To Short Stay 02/11/18 0917 02/12/18 1336      Discharge Exam: Blood pressure 102/64, pulse (!) 50, temperature 97.8 F (36.6 C), temperature source Oral, resp. rate 16, height 5' 10"  (1.778 m), weight (!) 146.5 kg (323 lb), SpO2 99 %. Neurologic: Grossly normal dressing ry  Discharge Medications:   Allergies as of 02/13/2018      Reactions   Caine-1 [lidocaine] Swelling, Rash   Eyes swell shut; includes all caine drugs except marcaine. EMLA cream OK though (?!)   Flagyl [metronidazole] Nausea Only   Adhesive [tape] Other (See Comments)   Blisters - can use paper tape   Iron Nausea Only   Oxycodone    NIGHTMARES. (tolerates hydrocodone or tramadol better)   Penicillins Rash   Has  patient had a PCN reaction causing immediate rash, facial/tongue/throat swelling, SOB or lightheadedness with hypotension: no Has patient had a PCN reaction causing severe rash involving mucus membranes or skin necrosis: no Has patient had a PCN reaction that required hospitalization no Has patient had a PCN reaction occurring within the last 10 years: no If all of the above answers are "NO", then may proceed with Cephalosporin use.   Sulfa Antibiotics Nausea And Vomiting, Rash      Medication List    TAKE these medications   AZO-STANDARD PO Place 1 tablet vaginally at bedtime. Azo vaginal probiotic  CoQ10 200 MG Caps Take 200 mg by mouth at bedtime.   DULoxetine 60 MG capsule Commonly known as:  CYMBALTA Take 120 mg by mouth at bedtime.   esomeprazole 40 MG capsule Commonly known as:  NEXIUM Take 1 capsule (40 mg total) by mouth 2 (two) times daily before a meal.   furosemide 20 MG tablet Commonly known as:  LASIX Take 20 mg by mouth See admin instructions. Take 20 mg by mouth up to twice weekly as needed for swelling   gabapentin 300 MG capsule Commonly known as:  NEURONTIN Take 300-600 mg by mouth See admin instructions. Take 300 mg in the morning and afternoon and 600 mg at night   HYDROcodone-acetaminophen 5-325 MG tablet Commonly known as:  NORCO/VICODIN Take 1-2 tablets by mouth every 6 (six) hours as needed for moderate pain. What changed:  how much to take   Melatonin 3 MG Tabs Take 3-6 mg by mouth at bedtime.   meloxicam 15 MG tablet Commonly known as:  MOBIC Take 15 mg by mouth at bedtime.   methocarbamol 500 MG tablet Commonly known as:  ROBAXIN Take 2 tablets (1,000 mg total) by mouth every 6 (six) hours as needed for muscle spasms.   nebivolol 5 MG tablet Commonly known as:  BYSTOLIC Take 5 mg by mouth at bedtime.   OVER THE COUNTER MEDICATION Place 10 drops under the tongue 2 (two) times daily. CBD oil   PREMARIN vaginal cream Generic drug:   conjugated estrogens Place 3.29 applicators vaginally 3 (three) times a week.   rosuvastatin 5 MG tablet Commonly known as:  CRESTOR Take 5 mg by mouth at bedtime.   traMADol 50 MG tablet Commonly known as:  ULTRAM Take 50 mg by mouth every 6 (six) hours as needed for moderate pain.   triamcinolone lotion 0.1 % Commonly known as:  KENALOG Apply 1 application topically as needed (dry skin).   Vitamin D3 5000 units Caps Take 5,000 Units by mouth at bedtime.   ZOFRAN 8 MG tablet Generic drug:  ondansetron Take 1 tablet by mouth as needed.            Durable Medical Equipment  (From admission, onward)        Start     Ordered   02/13/18 1308  For home use only DME 3 n 1  Once     02/13/18 1307   02/12/18 1315  DME Walker rolling  Once    Question:  Patient needs a walker to treat with the following condition  Answer:  S/P lumbar fusion   02/12/18 1314   02/12/18 1315  DME 3 n 1  Once     02/12/18 1314      Disposition: home   Final Dx: PLIF L4-5  Discharge Instructions     Remove dressing in 72 hours   Complete by:  As directed    Call MD for:  difficulty breathing, headache or visual disturbances   Complete by:  As directed    Call MD for:  persistant nausea and vomiting   Complete by:  As directed    Call MD for:  redness, tenderness, or signs of infection (pain, swelling, redness, odor or green/yellow discharge around incision site)   Complete by:  As directed    Call MD for:  severe uncontrolled pain   Complete by:  As directed    Call MD for:  temperature >100.4   Complete by:  As directed    Diet -  low sodium heart healthy   Complete by:  As directed    Increase activity slowly   Complete by:  As directed          Signed: Pauletta Pickney S 02/13/2018, 1:09 PM

## 2018-02-13 NOTE — Evaluation (Signed)
Occupational Therapy Evaluation Patient Details Name: Lindsay Little MRN: [id number] DOB: Nov 13, 1965 Today's Date: 02/13/2018    History of Present Illness Pt is a 52 y/o female s/p L4-5 PLIF. PMH includes anxiety, colon cancer s/p colostomy, treatment induced neuropathy, and HTN.    Clinical Impression   PTA patient was independent with ADLs but required assistance with IADLs.  She currently is able to complete UB and LB self care with supervision, toilet and shower transfers with supervision.  Educated on precautions, safety, adaptive equipment and modified techniques to maximize independence post back surgery, energy conservation, and mobility.  She plans to dc home with family support and at this time does not need further OT services.  Recommend 3:1 commode, reacher, long sponge and toileting aide for maximal independence with ADLs.  Thank you for this referral!  OT signing off.     Follow Up Recommendations  No OT follow up;Supervision - Intermittent    Equipment Recommendations  3 in 1 bedside commode    Recommendations for Other Services       Precautions / Restrictions Precautions Precautions: Back Precaution Booklet Issued: No(issued by prior PT) Precaution Comments: Reviewed back precautions with pt and pt's family.  Restrictions Weight Bearing Restrictions: No      Mobility Bed Mobility Overal bed mobility: Needs Assistance Bed Mobility: Rolling;Sidelying to Sit Rolling: Supervision Sidelying to sit: Supervision       General bed mobility comments: HOB elevated, supervision and able to complete log roll technique without cueing  Transfers Overall transfer level: Needs assistance Equipment used: None Transfers: Sit to/from Stand Sit to Stand: Supervision         General transfer comment: demonstrates good safety and technique    Balance Overall balance assessment: Needs assistance Sitting-balance support: No upper extremity supported;Feet  supported Sitting balance-Leahy Scale: Good     Standing balance support: No upper extremity supported;During functional activity Standing balance-Leahy Scale: Fair Standing balance comment: within base of support during functional tasks                            ADL either performed or assessed with clinical judgement   ADL Overall ADL's : Needs assistance/impaired Eating/Feeding: Modified independent;Sitting   Grooming: Supervision/safety;Set up;Standing Grooming Details (indicate cue type and reason): educated on compensatory techniques to adhere to back precautions Upper Body Bathing: Supervision/ safety;Sitting   Lower Body Bathing: Supervison/ safety;Adhering to back precautions;Sitting/lateral leans;With adaptive equipment Lower Body Bathing Details (indicate cue type and reason): educated on AE and compensatory techinques to adhere to back preacutions while seated for safety  Upper Body Dressing : Supervision/safety;Standing   Lower Body Dressing: Supervision/safety;With adaptive equipment;Cueing for compensatory techniques;Cueing for back precautions;Sit to/from stand Lower Body Dressing Details (indicate cue type and reason): educated on compensatory techinques and AE for increaed ease with LB dressing, benefits from [place] Transfer: Supervision/safety;Ambulation(simulated in room ) Toilet Transfer Details (indicate cue type and reason): educated on use of 3:1 over commode due to low toilets at home  Toileting- Water quality scientist and Hygiene: Supervision/safety;With adaptive equipment;Cueing for compensatory techniques;Cueing for back precautions;Sit to/from stand Toileting - Clothing Manipulation Details (indicate cue type and reason): educated on compensatory techniques and AE for toileting ease  Tub/ Shower Transfer: Tub transfer;Supervision/safety;Ambulation;Shower seat(simulated in room ) Clinical cytogeneticist Details (indicate cue type and reason):  able to step over threshold supporting self with wall  Functional mobility during ADLs: Supervision/safety General ADL Comments: Patient progressing well.  Able to adhere to back preacutions without cueing, benefits from reacher, long sponge and toielting aide     Vision Baseline Vision/History: Wears glasses Wears Glasses: At all times Patient Visual Report: No change from baseline Vision Assessment?: No apparent visual deficits     Perception     Praxis      Pertinent Vitals/Pain Pain Assessment: Faces Faces Pain Scale: Hurts little more Pain Location: back  Pain Descriptors / Indicators: Aching;Operative site guarding;Sharp Pain Intervention(s): Limited activity within patient's tolerance;Monitored during session;Repositioned     Hand Dominance     Extremity/Trunk Assessment Upper Extremity Assessment Upper Extremity Assessment: Overall WFL for tasks assessed   Lower Extremity Assessment Lower Extremity Assessment: Defer to PT evaluation   Cervical / Trunk Assessment Cervical / Trunk Assessment: Other exceptions Cervical / Trunk Exceptions: s/p PLIF    Communication Communication Communication: No difficulties   Cognition Arousal/Alertness: Awake/alert Behavior During Therapy: WFL for tasks assessed/performed Overall Cognitive Status: Within Functional Limits for tasks assessed                                     General Comments       Exercises     Shoulder Instructions      Home Living Family/patient expects to be discharged to:: Private residence Living Arrangements: Spouse/significant other;Children Available Help at Discharge: Family;Available 24 hours/day Type of Home: House Home Access: Stairs to enter [employer] of Steps: 4 Entrance Stairs-Rails: Right;Left;Can reach both Home Layout: One level     Bathroom Shower/Tub: Teacher, early years/pre: Standard     Home Equipment: Shower seat           Prior Functioning/Environment Level of Independence: Independent        Comments: was independent ADLs, did not complete IADLs (spouse/kids assisted), still driving but not working        OT Problem List: Decreased activity tolerance;Impaired balance (sitting and/or standing);Decreased knowledge of use of DME or AE;Decreased knowledge of precautions;Pain      OT Treatment/Interventions:      OT Goals(Current goals can be found in the care plan section) Acute Rehab OT Goals Patient Stated Goal: "to go home"  OT Goal Formulation: With patient  OT Frequency:     Barriers to D/C:            Co-evaluation              AM-PAC PT "6 Clicks" Daily Activity     Outcome Measure Help from another person eating meals?: None Help from another person taking care of personal grooming?: A Little Help from another person toileting, which includes using toliet, bedpan, or urinal?: A Little Help from another person bathing (including washing, rinsing, drying)?: A Little Help from another person to put on and taking off regular upper body clothing?: None Help from another person to put on and taking off regular lower body clothing?: A Little 6 Click Score: 20   End of Session Nurse Communication: Mobility status(dc equipment needs)  Activity Tolerance: Patient tolerated treatment well Patient left: with call bell/phone within reach;with family/visitor present(seated EOB)  OT Visit Diagnosis: Other abnormalities of gait and mobility (R26.89);Muscle weakness (generalized) (M62.81);Pain Pain - part of body: (back)                Time: 3762-8315 OT Time Calculation (min): 19 min Charges:  OT General Charges $OT Visit: 1  Visit OT Evaluation $OT Eval Moderate Complexity: 1 [address] OTR/L  Pager [place] 02/13/2018, 8:32 AM

## 2018-02-16 ENCOUNTER — Ambulatory Visit: Payer: Self-pay | Admitting: Surgery

## 2018-02-25 ENCOUNTER — Ambulatory Visit: Payer: Self-pay | Admitting: Surgery

## 2018-03-12 NOTE — Patient Instructions (Addendum)
KARINNA BEADLES  03/12/2018   Your procedure is scheduled on: 03-20-18 Friday  Report to [facility] Main  Entrance              Report to admitting at 530  AM    Call this number if you have problems the morning of surgery [phone]    Remember: Do not eat food :After Midnight.   NO SOLID FOOD AFTER MIDNIGHT THE NIGHT PRIOR TO SURGERY. NOTHING BY MOUTH EXCEPT CLEAR LIQUIDS UNTIL 3 HOURS PRIOR TO Lucas SURGERY. PLEASE FINISH ENSURE DRINK PER SURGEON ORDER 3 HOURS PRIOR TO SCHEDULED SURGERY TIME WHICH NEEDS TO BE COMPLETED AT  430 AM.     CLEAR LIQUID DIET   Foods Allowed                                                                     Foods Excluded  Coffee and tea, regular and decaf                             liquids that you cannot  Plain Jell-O in any flavor                                             see through such as: Fruit ices (not with fruit pulp)                                     milk, soups, orange juice  Iced Popsicles                                    All solid food Carbonated beverages, regular and diet                                    Cranberry, grape and apple juices Sports drinks like Gatorade Lightly seasoned clear broth or consume(fat free) Sugar, honey syrup  Sample Menu Breakfast                                Lunch                                     Supper Cranberry juice                    Beef broth                            Chicken broth Jell-O  Grape juice                           Apple juice Coffee or tea                        Jell-O                                      Popsicle                                                Coffee or tea                        Coffee or tea  _____________________________________________________________________    Take these medicines the morning of surgery with A SIP OF WATER: HYDROCODONE IF NEEDED, GABAPENTIN (NEURONTIN),  NEXIUM,  Coreg              You may not have any metal on your body including hair pins and              piercings  Do not wear jewelry, make-up, lotions, powders or perfumes, deodorant             Do not wear nail polish.  Do not shave  48 hours prior to surgery.               Do not bring valuables to the hospital. [place].  Contacts, dentures or bridgework may not be worn into surgery.  Leave suitcase in the car. After surgery it may be brought to your room.                  Please read over the following fact sheets you were given: _____________________________________________________________________  [facility] - Preparing for Surgery Before surgery, you can play an important role.  Because skin is not sterile, your skin needs to be as free of germs as possible.  You can reduce the number of germs on your skin by washing with CHG (chlorahexidine gluconate) soap before surgery.  CHG is an antiseptic cleaner which kills germs and bonds with the skin to continue killing germs even after washing. Please DO NOT use if you have an allergy to CHG or antibacterial soaps.  If your skin becomes reddened/irritated stop using the CHG and inform your nurse when you arrive at Short Stay. Do not shave (including legs and underarms) for at least 48 hours prior to the first CHG shower.  You may shave your face/neck. Please follow these instructions carefully:  1.  Shower with CHG Soap the night before surgery and the  morning of Surgery.  2.  If you choose to wash your hair, wash your hair first as usual with your  normal  shampoo.  3.  After you shampoo, rinse your hair and body thoroughly to remove the  shampoo.                           4.  Use CHG as you would any other liquid soap.  You can apply chg directly  to the skin and wash                       Gently with a scrungie or clean washcloth.  5.  Apply the CHG Soap to your body ONLY FROM THE NECK DOWN.    Do not use on face/ open                           Wound or open sores. Avoid contact with eyes, ears mouth and genitals (private parts).                       Wash face,  Genitals (private parts) with your normal soap.             6.  Wash thoroughly, paying special attention to the area where your surgery  will be performed.  7.  Thoroughly rinse your body with warm water from the neck down.  8.  DO NOT shower/wash with your normal soap after using and rinsing off  the CHG Soap.                9.  Pat yourself dry with a clean towel.            10.  Wear clean pajamas.            11.  Place clean sheets on your bed the night of your first shower and do not  sleep with pets. Day of Surgery : Do not apply any lotions/deodorants the morning of surgery.  Please wear clean clothes to the hospital/surgery center.  FAILURE TO FOLLOW THESE INSTRUCTIONS MAY RESULT IN THE CANCELLATION OF YOUR SURGERY PATIENT SIGNATURE_________________________________  NURSE SIGNATURE__________________________________  ________________________________________________________________________

## 2018-03-12 NOTE — Progress Notes (Signed)
LOV CARDIOLOGY DR Marlou Porch 7=19-19 MENTIONS FUTURE ABDOMINAL SURGERY WITH DR GROSS. EKG 02-03-18 Epic ECHO 02-02-18 Epic CHEST CT 12-09-17 Epic CHEST XRAY 02-03-18 Epic

## 2018-03-17 ENCOUNTER — Encounter (HOSPITAL_COMMUNITY): Payer: Self-pay

## 2018-03-17 ENCOUNTER — Other Ambulatory Visit: Payer: Self-pay

## 2018-03-17 ENCOUNTER — Encounter (HOSPITAL_COMMUNITY)
Admission: RE | Admit: 2018-03-17 | Discharge: 2018-03-17 | Disposition: A | Discharge status: Home / Self Care | Payer: BLUE CROSS/BLUE SHIELD | Source: Ambulatory Visit | Attending: Surgery | Admitting: Surgery

## 2018-03-17 DIAGNOSIS — N360 Urethral fistula: Secondary | ICD-10-CM | POA: Insufficient documentation

## 2018-03-17 DIAGNOSIS — K435 Parastomal hernia without obstruction or  gangrene: Secondary | ICD-10-CM | POA: Insufficient documentation

## 2018-03-17 DIAGNOSIS — Z01818 Encounter for other preprocedural examination: Secondary | ICD-10-CM | POA: Diagnosis not present

## 2018-03-17 LAB — CBC
HCT: 38.2 % (ref 36.0–46.0)
Hemoglobin: 12.3 g/dL (ref 12.0–15.0)
MCH: 28.4 pg (ref 26.0–34.0)
MCHC: 32.2 g/dL (ref 30.0–36.0)
MCV: 88.2 fL (ref 78.0–100.0)
Platelets: 345 10*3/uL (ref 150–400)
RBC: 4.33 MIL/uL (ref 3.87–5.11)
RDW: 13.5 % (ref 11.5–15.5)
WBC: 17.9 10*3/uL — ABNORMAL HIGH (ref 4.0–10.5)

## 2018-03-17 LAB — BASIC METABOLIC PANEL
Anion gap: 11 (ref 5–15)
BUN: 12 mg/dL (ref 6–20)
CALCIUM: 10.1 mg/dL (ref 8.9–10.3)
CO2: 27 mmol/L (ref 22–32)
CREATININE: 0.7 mg/dL (ref 0.44–1.00)
Chloride: 101 mmol/L (ref 98–111)
GFR calc Af Amer: >60 mL/min (ref >60)
Glucose, Bld: 121 mg/dL — ABNORMAL HIGH (ref 70–99)
Potassium: 5 mmol/L (ref 3.5–5.1)
Sodium: 139 mmol/L (ref 135–145)

## 2018-03-17 NOTE — Progress Notes (Signed)
   03/17/18 0953  OBSTRUCTIVE SLEEP APNEA  Have you ever been diagnosed with sleep apnea through a sleep study? No  Do you snore loudly (loud enough to be heard through closed doors)?  1  Do you often feel tired, fatigued, or sleepy during the daytime (such as falling asleep during driving or talking to someone)? 0  Has anyone observed you stop breathing during your sleep? 0  Do you have, or are you being treated for high blood pressure? 1  BMI more than 35 kg/m2? 1  Age > 50 (1-yes) 1  Neck circumference greater than:Female 16 inches or larger, Female 17inches or larger? 1  Female Gender (Yes=1) 0  Obstructive Sleep Apnea Score 5  Score 5 or greater  Results sent to PCP

## 2018-03-19 MED ORDER — GENTAMICIN SULFATE 40 MG/ML IJ SOLN
5.0000 mg/kg | INTRAVENOUS | Status: AC
  Filled 2018-03-19: qty 12.75

## 2018-04-08 ENCOUNTER — Other Ambulatory Visit: Payer: Self-pay | Admitting: Neurological Surgery

## 2018-04-08 DIAGNOSIS — T8149XA Infection following a procedure, other surgical site, initial encounter: Secondary | ICD-10-CM

## 2018-04-10 ENCOUNTER — Ambulatory Visit
Admission: RE | Admit: 2018-04-10 | Discharge: 2018-04-10 | Disposition: A | Discharge status: Home / Self Care | Payer: BLUE CROSS/BLUE SHIELD | Source: Ambulatory Visit | Attending: Neurological Surgery | Admitting: Neurological Surgery

## 2018-04-10 DIAGNOSIS — T8149XA Infection following a procedure, other surgical site, initial encounter: Secondary | ICD-10-CM

## 2018-04-10 MED ORDER — GADOBENATE DIMEGLUMINE 529 MG/ML IV SOLN
20.0000 mL | Freq: Once | INTRAVENOUS | Status: AC | PRN
  Administered 2018-04-10: 20 mL via INTRAVENOUS

## 2018-04-13 ENCOUNTER — Other Ambulatory Visit: Payer: Self-pay | Admitting: Neurological Surgery

## 2018-04-13 DIAGNOSIS — T8149XA Infection following a procedure, other surgical site, initial encounter: Secondary | ICD-10-CM

## 2018-04-15 ENCOUNTER — Ambulatory Visit
Admission: RE | Admit: 2018-04-15 | Discharge: 2018-04-15 | Disposition: A | Discharge status: Home / Self Care | Payer: Medicare Other | Source: Ambulatory Visit | Attending: Neurological Surgery | Admitting: Neurological Surgery

## 2018-04-15 VITALS — BP 162/90 | HR 91

## 2018-04-15 DIAGNOSIS — T8149XA Infection following a procedure, other surgical site, initial encounter: Secondary | ICD-10-CM

## 2018-04-15 DIAGNOSIS — Z981 Arthrodesis status: Secondary | ICD-10-CM

## 2018-04-15 NOTE — Discharge Instructions (Signed)

## 2018-04-20 ENCOUNTER — Other Ambulatory Visit: Payer: Self-pay | Admitting: Neurological Surgery

## 2018-04-20 LAB — ANAEROBIC AND AEROBIC CULTURE
AER RESULT:: NO GROWTH
MICRO NUMBER:: 91183557
MICRO NUMBER:: 91183558
SPECIMEN QUALITY: ADEQUATE
SPECIMEN QUALITY:: ADEQUATE

## 2018-04-22 NOTE — Pre-Procedure Instructions (Signed)
KRISTEENA MEINEKE  04/22/2018      [place], [place], [place] - [address] [place] [address] Phone: [phone] Fax: [phone]    Your procedure is scheduled on Oct. 11  Report to [place] at 6;30 A.M.  Call this number if you have problems the morning of surgery:  [phone]   Remember:  Do not eat or drink after midnight.      Take these medicines the morning of surgery with A SIP OF WATER :                Carvedilol (coreg)               Doxycycline (vibramycin)               Esomeprazole (nexium)               Gabapentin (neurontin)               Hydrocodone or tramadol  if needed               Methocarbamol (robaxin) if needed                7 days prior to surgery STOP taking any Aspirin(unless otherwise instructed by your surgeon), Aleve, Naproxen, Ibuprofen, Motrin, Advil, Goody's, BC's, all herbal medications, fish oil, and all vitamins    Do not wear jewelry, make-up or nail polish.  Do not wear lotions, powders, or perfumes, or deodorant.  Do not shave 48 hours prior to surgery.  Men may shave face and neck.  Do not bring valuables to the hospital.  [facility] is not responsible for any belongings or valuables.  Contacts, dentures or bridgework may not be worn into surgery.  Leave your suitcase in the car.  After surgery it may be brought to your room.  For patients admitted to the hospital, discharge time will be determined by your treatment team.  Patients discharged the day of surgery will not be allowed to drive home.                          Special instructions:  - Preparing For Surgery  Before surgery, you can play an important role. Because skin is not sterile, your skin needs to be as free of germs as possible. You can reduce the number of germs on your skin by washing with CHG (chlorahexidine gluconate) Soap before surgery.  CHG is an antiseptic cleaner  which kills germs and bonds with the skin to continue killing germs even after washing.    Oral Hygiene is also important to reduce your risk of infection.  Remember - BRUSH YOUR TEETH THE MORNING OF SURGERY WITH YOUR REGULAR TOOTHPASTE  Please do not use if you have an allergy to CHG or antibacterial soaps. If your skin becomes reddened/irritated stop using the CHG.  Do not shave (including legs and underarms) for at least 48 hours prior to first CHG shower. It is OK to shave your face.  Please follow these instructions carefully.   1. Shower the NIGHT BEFORE SURGERY and the MORNING OF SURGERY with CHG.   2. If you chose to wash your hair, wash your hair first as usual with your normal shampoo.  3. After you shampoo, rinse your hair and body thoroughly to remove the shampoo.  4. Use CHG  as you would any other liquid soap. You can apply CHG directly to the skin and wash gently with a scrungie or a clean washcloth.   5. Apply the CHG Soap to your body ONLY FROM THE NECK DOWN.  Do not use on open wounds or open sores. Avoid contact with your eyes, ears, mouth and genitals (private parts). Wash Face and genitals (private parts)  with your normal soap.  6. Wash thoroughly, paying special attention to the area where your surgery will be performed.  7. Thoroughly rinse your body with warm water from the neck down.  8. DO NOT shower/wash with your normal soap after using and rinsing off the CHG Soap.  9. Pat yourself dry with a CLEAN TOWEL.  10. Wear CLEAN PAJAMAS to bed the night before surgery, wear comfortable clothes the morning of surgery  11. Place CLEAN SHEETS on your bed the night of your first shower and DO NOT SLEEP WITH PETS.    Day of Surgery:  Do not apply any deodorants/lotions.  Please wear clean clothes to the hospital/surgery center.   Remember to brush your teeth WITH YOUR REGULAR TOOTHPASTE.    Please read over the following fact sheets that you were  given. Coughing and Deep Breathing and Surgical Site Infection Prevention

## 2018-04-23 ENCOUNTER — Encounter (HOSPITAL_COMMUNITY): Payer: Self-pay

## 2018-04-23 ENCOUNTER — Other Ambulatory Visit: Payer: Self-pay

## 2018-04-23 ENCOUNTER — Encounter (HOSPITAL_COMMUNITY)
Admission: RE | Admit: 2018-04-23 | Discharge: 2018-04-23 | Disposition: A | Discharge status: Home / Self Care | Payer: Medicare Other | Source: Ambulatory Visit | Attending: Neurological Surgery | Admitting: Neurological Surgery

## 2018-04-23 LAB — CBC
HEMATOCRIT: 41.5 % (ref 36.0–46.0)
HEMOGLOBIN: 12.6 g/dL (ref 12.0–15.0)
MCH: 26.7 pg (ref 26.0–34.0)
MCHC: 30.4 g/dL (ref 30.0–36.0)
MCV: 87.9 fL (ref 80.0–100.0)
NRBC: 0 % (ref 0.0–0.2)
Platelets: 322 10*3/uL (ref 150–400)
RBC: 4.72 MIL/uL (ref 3.87–5.11)
RDW: 13.7 % (ref 11.5–15.5)
WBC: 8.1 10*3/uL (ref 4.0–10.5)

## 2018-04-23 LAB — BASIC METABOLIC PANEL
Anion gap: 11 (ref 5–15)
BUN: 10 mg/dL (ref 6–20)
CHLORIDE: 104 mmol/L (ref 98–111)
CO2: 24 mmol/L (ref 22–32)
Calcium: 10 mg/dL (ref 8.9–10.3)
Creatinine, Ser: 0.64 mg/dL (ref 0.44–1.00)
GFR calc Af Amer: >60 mL/min (ref >60)
GFR calc non Af Amer: >60 mL/min (ref >60)
Glucose, Bld: 95 mg/dL (ref 70–99)
POTASSIUM: 4.5 mmol/L (ref 3.5–5.1)
Sodium: 139 mmol/L (ref 135–145)

## 2018-04-23 LAB — SURGICAL PCR SCREEN
MRSA, PCR: NEGATIVE
Staphylococcus aureus: NEGATIVE

## 2018-04-23 MED ORDER — BUPIVACAINE LIPOSOME 1.3 % IJ SUSP
20.0000 mL | INTRAMUSCULAR | Status: DC
  Filled 2018-04-23: qty 20

## 2018-04-23 MED ORDER — VANCOMYCIN HCL 10 G IV SOLR
1500.0000 mg | INTRAVENOUS | Status: AC
  Administered 2018-04-24: 1500 mg via INTRAVENOUS
  Filled 2018-04-23: qty 1500

## 2018-04-23 MED ORDER — DEXAMETHASONE SODIUM PHOSPHATE 10 MG/ML IJ SOLN
10.0000 mg | INTRAMUSCULAR | Status: AC
  Administered 2018-04-24: 10 mg via INTRAVENOUS
  Filled 2018-04-23: qty 1

## 2018-04-23 NOTE — Progress Notes (Signed)
PCP: Dr. [name]   Cardiologist: Dr. Candee Furbish

## 2018-04-24 ENCOUNTER — Inpatient Hospital Stay (HOSPITAL_COMMUNITY): Payer: Medicare Other | Admitting: Physician Assistant

## 2018-04-24 ENCOUNTER — Inpatient Hospital Stay (HOSPITAL_COMMUNITY): Payer: Medicare Other | Admitting: Certified Registered"

## 2018-04-24 ENCOUNTER — Inpatient Hospital Stay (HOSPITAL_COMMUNITY)
Admission: RE | Admit: 2018-04-24 | Discharge: 2018-04-24 | DRG: 908 | Disposition: A | Discharge status: Home / Self Care | Payer: Medicare Other | Source: Ambulatory Visit | Attending: Neurological Surgery | Admitting: Neurological Surgery

## 2018-04-24 ENCOUNTER — Encounter (HOSPITAL_COMMUNITY)
Admission: RE | Disposition: A | Discharge status: Home / Self Care | Payer: Self-pay | Source: Ambulatory Visit | Attending: Neurological Surgery

## 2018-04-24 ENCOUNTER — Encounter (HOSPITAL_COMMUNITY): Payer: Self-pay | Admitting: *Deleted

## 2018-04-24 DIAGNOSIS — Z90710 Acquired absence of both cervix and uterus: Secondary | ICD-10-CM | POA: Diagnosis not present

## 2018-04-24 DIAGNOSIS — K449 Diaphragmatic hernia without obstruction or gangrene: Secondary | ICD-10-CM | POA: Diagnosis not present

## 2018-04-24 DIAGNOSIS — K589 Irritable bowel syndrome without diarrhea: Secondary | ICD-10-CM | POA: Diagnosis not present

## 2018-04-24 DIAGNOSIS — Z9221 Personal history of antineoplastic chemotherapy: Secondary | ICD-10-CM | POA: Diagnosis not present

## 2018-04-24 DIAGNOSIS — E669 Obesity, unspecified: Secondary | ICD-10-CM | POA: Diagnosis not present

## 2018-04-24 DIAGNOSIS — E559 Vitamin D deficiency, unspecified: Secondary | ICD-10-CM | POA: Diagnosis present

## 2018-04-24 DIAGNOSIS — I10 Essential (primary) hypertension: Secondary | ICD-10-CM | POA: Diagnosis not present

## 2018-04-24 DIAGNOSIS — G620 Drug-induced polyneuropathy: Secondary | ICD-10-CM | POA: Diagnosis present

## 2018-04-24 DIAGNOSIS — Z8349 Family history of other endocrine, nutritional and metabolic diseases: Secondary | ICD-10-CM

## 2018-04-24 DIAGNOSIS — G4733 Obstructive sleep apnea (adult) (pediatric): Secondary | ICD-10-CM | POA: Diagnosis present

## 2018-04-24 DIAGNOSIS — F329 Major depressive disorder, single episode, unspecified: Secondary | ICD-10-CM | POA: Diagnosis not present

## 2018-04-24 DIAGNOSIS — Z79890 Hormone replacement therapy (postmenopausal): Secondary | ICD-10-CM

## 2018-04-24 DIAGNOSIS — Z803 Family history of malignant neoplasm of breast: Secondary | ICD-10-CM

## 2018-04-24 DIAGNOSIS — Z6841 Body Mass Index (BMI) 40.0 and over, adult: Secondary | ICD-10-CM | POA: Diagnosis not present

## 2018-04-24 DIAGNOSIS — E7800 Pure hypercholesterolemia, unspecified: Secondary | ICD-10-CM | POA: Diagnosis present

## 2018-04-24 DIAGNOSIS — M96843 Postprocedural seroma of a musculoskeletal structure following other procedure: Principal | ICD-10-CM | POA: Diagnosis present

## 2018-04-24 DIAGNOSIS — F419 Anxiety disorder, unspecified: Secondary | ICD-10-CM | POA: Diagnosis present

## 2018-04-24 DIAGNOSIS — K219 Gastro-esophageal reflux disease without esophagitis: Secondary | ICD-10-CM | POA: Diagnosis present

## 2018-04-24 DIAGNOSIS — Z973 Presence of spectacles and contact lenses: Secondary | ICD-10-CM

## 2018-04-24 DIAGNOSIS — Z8249 Family history of ischemic heart disease and other diseases of the circulatory system: Secondary | ICD-10-CM

## 2018-04-24 DIAGNOSIS — Z85048 Personal history of other malignant neoplasm of rectum, rectosigmoid junction, and anus: Secondary | ICD-10-CM

## 2018-04-24 DIAGNOSIS — Z87891 Personal history of nicotine dependence: Secondary | ICD-10-CM

## 2018-04-24 DIAGNOSIS — Z923 Personal history of irradiation: Secondary | ICD-10-CM

## 2018-04-24 DIAGNOSIS — Z808 Family history of malignant neoplasm of other organs or systems: Secondary | ICD-10-CM | POA: Diagnosis not present

## 2018-04-24 DIAGNOSIS — Z881 Allergy status to other antibiotic agents status: Secondary | ICD-10-CM | POA: Diagnosis not present

## 2018-04-24 DIAGNOSIS — D509 Iron deficiency anemia, unspecified: Secondary | ICD-10-CM | POA: Diagnosis present

## 2018-04-24 DIAGNOSIS — Z882 Allergy status to sulfonamides status: Secondary | ICD-10-CM

## 2018-04-24 DIAGNOSIS — Z885 Allergy status to narcotic agent status: Secondary | ICD-10-CM

## 2018-04-24 DIAGNOSIS — Z9049 Acquired absence of other specified parts of digestive tract: Secondary | ICD-10-CM

## 2018-04-24 DIAGNOSIS — C20 Malignant neoplasm of rectum: Secondary | ICD-10-CM

## 2018-04-24 DIAGNOSIS — Z79899 Other long term (current) drug therapy: Secondary | ICD-10-CM

## 2018-04-24 DIAGNOSIS — T451X5A Adverse effect of antineoplastic and immunosuppressive drugs, initial encounter: Secondary | ICD-10-CM | POA: Diagnosis present

## 2018-04-24 DIAGNOSIS — Z888 Allergy status to other drugs, medicaments and biological substances status: Secondary | ICD-10-CM

## 2018-04-24 DIAGNOSIS — Z79891 Long term (current) use of opiate analgesic: Secondary | ICD-10-CM

## 2018-04-24 DIAGNOSIS — Z91048 Other nonmedicinal substance allergy status: Secondary | ICD-10-CM

## 2018-04-24 DIAGNOSIS — Z833 Family history of diabetes mellitus: Secondary | ICD-10-CM

## 2018-04-24 DIAGNOSIS — Z880 Allergy status to penicillin: Secondary | ICD-10-CM

## 2018-04-24 HISTORY — PX: LUMBAR WOUND DEBRIDEMENT: SHX1988

## 2018-04-24 SURGERY — LUMBAR WOUND DEBRIDEMENT
Anesthesia: General | Site: Spine Lumbar

## 2018-04-24 MED ORDER — SODIUM CHLORIDE 0.9% FLUSH: 3.0000 mL | Freq: Two times a day (BID) | INTRAVENOUS | Status: DC

## 2018-04-24 MED ORDER — BUPIVACAINE HCL (PF) 0.25 % IJ SOLN
INTRAMUSCULAR | Status: DC | PRN
  Administered 2018-04-24: 10 mL

## 2018-04-24 MED ORDER — FENTANYL CITRATE (PF) 100 MCG/2ML IJ SOLN
INTRAMUSCULAR | Status: AC
  Administered 2018-04-24: 50 ug via INTRAVENOUS
  Filled 2018-04-24: qty 2

## 2018-04-24 MED ORDER — HYDROCODONE-ACETAMINOPHEN 5-325 MG PO TABS: 1.0000 | ORAL_TABLET | Freq: Four times a day (QID) | ORAL | 0 refills | Status: DC | PRN

## 2018-04-24 MED ORDER — FENTANYL CITRATE (PF) 100 MCG/2ML IJ SOLN
INTRAMUSCULAR | Status: DC | PRN
  Administered 2018-04-24 (×2): 50 ug via INTRAVENOUS
  Administered 2018-04-24: 100 ug via INTRAVENOUS
  Administered 2018-04-24: 50 ug via INTRAVENOUS

## 2018-04-24 MED ORDER — LACTATED RINGERS IV SOLN
INTRAVENOUS | Status: DC
  Administered 2018-04-24: 08:00:00 via INTRAVENOUS

## 2018-04-24 MED ORDER — ACETAMINOPHEN 650 MG RE SUPP: 650.0000 mg | RECTAL | Status: DC | PRN

## 2018-04-24 MED ORDER — ONDANSETRON HCL 4 MG PO TABS: 4.0000 mg | ORAL_TABLET | Freq: Four times a day (QID) | ORAL | Status: DC | PRN

## 2018-04-24 MED ORDER — EPHEDRINE 5 MG/ML INJ
INTRAVENOUS | Status: AC
  Filled 2018-04-24: qty 10

## 2018-04-24 MED ORDER — PROMETHAZINE HCL 25 MG/ML IJ SOLN: 6.2500 mg | INTRAMUSCULAR | Status: DC | PRN

## 2018-04-24 MED ORDER — MENTHOL 3 MG MT LOZG: 1.0000 | LOZENGE | OROMUCOSAL | Status: DC | PRN

## 2018-04-24 MED ORDER — ACETAMINOPHEN 325 MG PO TABS: 325.0000 mg | ORAL_TABLET | ORAL | Status: DC | PRN

## 2018-04-24 MED ORDER — ONDANSETRON HCL 4 MG/2ML IJ SOLN
INTRAMUSCULAR | Status: AC
  Filled 2018-04-24: qty 2

## 2018-04-24 MED ORDER — METHOCARBAMOL 500 MG PO TABS
ORAL_TABLET | ORAL | Status: AC
  Administered 2018-04-24: 500 mg via ORAL
  Filled 2018-04-24: qty 1

## 2018-04-24 MED ORDER — ACETAMINOPHEN 10 MG/ML IV SOLN: 1000.0000 mg | Freq: Once | INTRAVENOUS | Status: DC | PRN

## 2018-04-24 MED ORDER — VANCOMYCIN HCL 1000 MG IV SOLR
INTRAVENOUS | Status: AC
  Filled 2018-04-24: qty 1000

## 2018-04-24 MED ORDER — SENNA 8.6 MG PO TABS
1.0000 | ORAL_TABLET | Freq: Two times a day (BID) | ORAL | Status: DC
  Filled 2018-04-24: qty 1

## 2018-04-24 MED ORDER — SODIUM CHLORIDE 0.9% FLUSH: 3.0000 mL | INTRAVENOUS | Status: DC | PRN

## 2018-04-24 MED ORDER — CHLORHEXIDINE GLUCONATE CLOTH 2 % EX PADS: 6.0000 | MEDICATED_PAD | Freq: Once | CUTANEOUS | Status: DC

## 2018-04-24 MED ORDER — MELOXICAM 7.5 MG PO TABS
15.0000 mg | ORAL_TABLET | Freq: Every day | ORAL | Status: DC
  Filled 2018-04-24: qty 2

## 2018-04-24 MED ORDER — LIDOCAINE 2% (20 MG/ML) 5 ML SYRINGE
INTRAMUSCULAR | Status: AC
  Filled 2018-04-24: qty 5

## 2018-04-24 MED ORDER — SODIUM CHLORIDE 0.9 % IV SOLN
INTRAVENOUS | Status: DC | PRN
  Administered 2018-04-24: 500 mL

## 2018-04-24 MED ORDER — PROPOFOL 10 MG/ML IV BOLUS
INTRAVENOUS | Status: AC
  Filled 2018-04-24: qty 40

## 2018-04-24 MED ORDER — ADULT MULTIVITAMIN W/MINERALS CH: 2.0000 | ORAL_TABLET | Freq: Every day | ORAL | Status: DC

## 2018-04-24 MED ORDER — CARVEDILOL 3.125 MG PO TABS
3.1250 mg | ORAL_TABLET | Freq: Two times a day (BID) | ORAL | Status: DC
  Filled 2018-04-24: qty 1

## 2018-04-24 MED ORDER — VANCOMYCIN HCL 1000 MG IV SOLR
INTRAVENOUS | Status: DC | PRN
  Administered 2018-04-24: 1000 mg

## 2018-04-24 MED ORDER — MELATONIN 3 MG PO TABS
3.0000 mg | ORAL_TABLET | Freq: Every day | ORAL | Status: DC
  Filled 2018-04-24: qty 1

## 2018-04-24 MED ORDER — PROPOFOL 10 MG/ML IV BOLUS
INTRAVENOUS | Status: DC | PRN
  Administered 2018-04-24: 200 mg via INTRAVENOUS

## 2018-04-24 MED ORDER — PHENOL 1.4 % MT LIQD: 1.0000 | OROMUCOSAL | Status: DC | PRN

## 2018-04-24 MED ORDER — THROMBIN 5000 UNITS EX SOLR
CUTANEOUS | Status: AC
  Filled 2018-04-24: qty 5000

## 2018-04-24 MED ORDER — VANCOMYCIN HCL 10 G IV SOLR
1500.0000 mg | Freq: Two times a day (BID) | INTRAVENOUS | Status: DC
  Filled 2018-04-24: qty 1500

## 2018-04-24 MED ORDER — PANTOPRAZOLE SODIUM 40 MG PO TBEC
40.0000 mg | DELAYED_RELEASE_TABLET | Freq: Every day | ORAL | Status: DC
  Filled 2018-04-24: qty 1

## 2018-04-24 MED ORDER — FUROSEMIDE 20 MG PO TABS: 20.0000 mg | ORAL_TABLET | ORAL | Status: DC

## 2018-04-24 MED ORDER — SCOPOLAMINE 1 MG/3DAYS TD PT72
MEDICATED_PATCH | TRANSDERMAL | Status: AC
  Administered 2018-04-24: 1.5 mg
  Filled 2018-04-24: qty 1

## 2018-04-24 MED ORDER — SUCCINYLCHOLINE CHLORIDE 200 MG/10ML IV SOSY
PREFILLED_SYRINGE | INTRAVENOUS | Status: AC
  Filled 2018-04-24: qty 10

## 2018-04-24 MED ORDER — HYDROCODONE-ACETAMINOPHEN 5-325 MG PO TABS
ORAL_TABLET | ORAL | Status: AC
  Administered 2018-04-24: 2 via ORAL
  Filled 2018-04-24: qty 2

## 2018-04-24 MED ORDER — PHENYLEPHRINE 40 MCG/ML (10ML) SYRINGE FOR IV PUSH (FOR BLOOD PRESSURE SUPPORT)
PREFILLED_SYRINGE | INTRAVENOUS | Status: AC
  Filled 2018-04-24: qty 10

## 2018-04-24 MED ORDER — GABAPENTIN 300 MG PO CAPS
300.0000 mg | ORAL_CAPSULE | Freq: Every day | ORAL | Status: DC
  Administered 2018-04-24: 300 mg via ORAL
  Filled 2018-04-24: qty 1

## 2018-04-24 MED ORDER — ROCURONIUM BROMIDE 10 MG/ML (PF) SYRINGE
PREFILLED_SYRINGE | INTRAVENOUS | Status: DC | PRN
  Administered 2018-04-24: 50 mg via INTRAVENOUS

## 2018-04-24 MED ORDER — MIDAZOLAM HCL 2 MG/2ML IJ SOLN
INTRAMUSCULAR | Status: AC
  Filled 2018-04-24: qty 2

## 2018-04-24 MED ORDER — SUGAMMADEX SODIUM 200 MG/2ML IV SOLN
INTRAVENOUS | Status: DC | PRN
  Administered 2018-04-24: 299.4 mg via INTRAVENOUS

## 2018-04-24 MED ORDER — SCOPOLAMINE 1 MG/3DAYS TD PT72
1.0000 | MEDICATED_PATCH | TRANSDERMAL | Status: DC
  Filled 2018-04-24: qty 1

## 2018-04-24 MED ORDER — ONDANSETRON HCL 4 MG/2ML IJ SOLN
4.0000 mg | Freq: Four times a day (QID) | INTRAMUSCULAR | Status: DC | PRN
  Administered 2018-04-24: 4 mg via INTRAVENOUS

## 2018-04-24 MED ORDER — DULOXETINE HCL 30 MG PO CPEP: 120.0000 mg | ORAL_CAPSULE | Freq: Every day | ORAL | Status: DC

## 2018-04-24 MED ORDER — ONDANSETRON HCL 4 MG/2ML IJ SOLN
INTRAMUSCULAR | Status: DC | PRN
  Administered 2018-04-24: 4 mg via INTRAVENOUS

## 2018-04-24 MED ORDER — SODIUM CHLORIDE 0.9 % IV SOLN: 250.0000 mL | INTRAVENOUS | Status: DC

## 2018-04-24 MED ORDER — FENTANYL CITRATE (PF) 100 MCG/2ML IJ SOLN
25.0000 ug | INTRAMUSCULAR | Status: DC | PRN
  Administered 2018-04-24 (×2): 50 ug via INTRAVENOUS

## 2018-04-24 MED ORDER — ACETAMINOPHEN 325 MG PO TABS: 650.0000 mg | ORAL_TABLET | ORAL | Status: DC | PRN

## 2018-04-24 MED ORDER — DEXAMETHASONE SODIUM PHOSPHATE 10 MG/ML IJ SOLN
INTRAMUSCULAR | Status: AC
  Filled 2018-04-24: qty 1

## 2018-04-24 MED ORDER — GABAPENTIN 600 MG PO TABS: 600.0000 mg | ORAL_TABLET | Freq: Every day | ORAL | Status: DC

## 2018-04-24 MED ORDER — POTASSIUM CHLORIDE IN NACL 20-0.9 MEQ/L-% IV SOLN: INTRAVENOUS | Status: DC

## 2018-04-24 MED ORDER — MORPHINE SULFATE (PF) 2 MG/ML IV SOLN: 2.0000 mg | INTRAVENOUS | Status: DC | PRN

## 2018-04-24 MED ORDER — FENTANYL CITRATE (PF) 250 MCG/5ML IJ SOLN
INTRAMUSCULAR | Status: AC
  Filled 2018-04-24: qty 5

## 2018-04-24 MED ORDER — HYDROCODONE-ACETAMINOPHEN 5-325 MG PO TABS
1.0000 | ORAL_TABLET | Freq: Four times a day (QID) | ORAL | Status: DC | PRN
  Administered 2018-04-24: 2 via ORAL
  Filled 2018-04-24: qty 2

## 2018-04-24 MED ORDER — MIDAZOLAM HCL 5 MG/5ML IJ SOLN
INTRAMUSCULAR | Status: DC | PRN
  Administered 2018-04-24: 2 mg via INTRAVENOUS

## 2018-04-24 MED ORDER — LIDOCAINE 20MG/ML (2%) 15 ML SYRINGE OPTIME
INTRAMUSCULAR | Status: DC | PRN
  Administered 2018-04-24: 100 mg via INTRAVENOUS

## 2018-04-24 MED ORDER — 0.9 % SODIUM CHLORIDE (POUR BTL) OPTIME
TOPICAL | Status: DC | PRN
  Administered 2018-04-24: 1000 mL

## 2018-04-24 MED ORDER — BUPIVACAINE HCL (PF) 0.25 % IJ SOLN
INTRAMUSCULAR | Status: AC
  Filled 2018-04-24: qty 30

## 2018-04-24 MED ORDER — ACETAMINOPHEN 160 MG/5ML PO SOLN: 325.0000 mg | ORAL | Status: DC | PRN

## 2018-04-24 MED ORDER — METHOCARBAMOL 500 MG PO TABS
1000.0000 mg | ORAL_TABLET | Freq: Four times a day (QID) | ORAL | Status: DC | PRN
  Administered 2018-04-24: 500 mg via ORAL
  Filled 2018-04-24: qty 2

## 2018-04-24 SURGICAL SUPPLY — 48 items
ADH SKN CLS APL DERMABOND .7 (GAUZE/BANDAGES/DRESSINGS) ×1
APL SKNCLS STERI-STRIP NONHPOA (GAUZE/BANDAGES/DRESSINGS) ×1
BAG DECANTER FOR FLEXI CONT (MISCELLANEOUS) ×4 IMPLANT
BENZOIN TINCTURE PRP APPL 2/3 (GAUZE/BANDAGES/DRESSINGS) ×2 IMPLANT
CANISTER SUCT 3000ML PPV (MISCELLANEOUS) ×2 IMPLANT
CARTRIDGE OIL MAESTRO DRILL (MISCELLANEOUS) ×1 IMPLANT
COVER WAND RF STERILE (DRAPES) ×2 IMPLANT
DERMABOND ADVANCED (GAUZE/BANDAGES/DRESSINGS) ×1
DERMABOND ADVANCED .7 DNX12 (GAUZE/BANDAGES/DRESSINGS) ×1 IMPLANT
DIFFUSER DRILL AIR PNEUMATIC (MISCELLANEOUS) ×2 IMPLANT
DRAPE LAPAROTOMY 100X72X124 (DRAPES) ×2 IMPLANT
DRAPE POUCH INSTRU U-SHP 10X18 (DRAPES) ×2 IMPLANT
DRSG OPSITE POSTOP 4X6 (GAUZE/BANDAGES/DRESSINGS) ×2 IMPLANT
DURAPREP 26ML APPLICATOR (WOUND CARE) IMPLANT
DURAPREP 6ML APPLICATOR 50/CS (WOUND CARE) IMPLANT
ELECT REM PT RETURN 9FT ADLT (ELECTROSURGICAL) ×2
ELECTRODE REM PT RTRN 9FT ADLT (ELECTROSURGICAL) ×1 IMPLANT
EVACUATOR 1/8 PVC DRAIN (DRAIN) ×2 IMPLANT
GAUZE 4X4 16PLY RFD (DISPOSABLE) IMPLANT
GLOVE BIO SURGEON STRL SZ7 (GLOVE) IMPLANT
GLOVE BIO SURGEON STRL SZ8 (GLOVE) ×2 IMPLANT
GLOVE BIOGEL PI IND STRL 7.0 (GLOVE) IMPLANT
GLOVE BIOGEL PI INDICATOR 7.0 (GLOVE)
GOWN STRL REUS W/ TWL LRG LVL3 (GOWN DISPOSABLE) IMPLANT
GOWN STRL REUS W/ TWL XL LVL3 (GOWN DISPOSABLE) IMPLANT
GOWN STRL REUS W/TWL 2XL LVL3 (GOWN DISPOSABLE) ×2 IMPLANT
GOWN STRL REUS W/TWL LRG LVL3 (GOWN DISPOSABLE)
GOWN STRL REUS W/TWL XL LVL3 (GOWN DISPOSABLE)
KIT BASIN OR (CUSTOM PROCEDURE TRAY) ×2 IMPLANT
KIT TURNOVER KIT B (KITS) ×2 IMPLANT
NEEDLE HYPO 18GX1.5 BLUNT FILL (NEEDLE) IMPLANT
NEEDLE HYPO 25X1 1.5 SAFETY (NEEDLE) ×2 IMPLANT
NEEDLE SPNL 20GX3.5 QUINCKE YW (NEEDLE) IMPLANT
NS IRRIG 1000ML POUR BTL (IV SOLUTION) ×2 IMPLANT
OIL CARTRIDGE MAESTRO DRILL (MISCELLANEOUS) ×2
PACK LAMINECTOMY NEURO (CUSTOM PROCEDURE TRAY) ×2 IMPLANT
PAD ARMBOARD 7.5X6 YLW CONV (MISCELLANEOUS) ×6 IMPLANT
STRIP CLOSURE SKIN 1/2X4 (GAUZE/BANDAGES/DRESSINGS) ×2 IMPLANT
SUT VIC AB 0 CT1 18XCR BRD8 (SUTURE) ×1 IMPLANT
SUT VIC AB 0 CT1 8-18 (SUTURE) ×2
SUT VIC AB 2-0 CP2 18 (SUTURE) ×2 IMPLANT
SUT VIC AB 3-0 SH 8-18 (SUTURE) ×4 IMPLANT
SWAB COLLECTION DEVICE MRSA (MISCELLANEOUS) ×2 IMPLANT
SWAB CULTURE ESWAB REG 1ML (MISCELLANEOUS) ×2 IMPLANT
SYR 3ML LL SCALE MARK (SYRINGE) IMPLANT
TOWEL GREEN STERILE (TOWEL DISPOSABLE) ×2 IMPLANT
TOWEL GREEN STERILE FF (TOWEL DISPOSABLE) ×2 IMPLANT
WATER STERILE IRR 1000ML POUR (IV SOLUTION) ×2 IMPLANT

## 2018-04-24 NOTE — Progress Notes (Signed)
Pharmacy Antibiotic Note  Lindsay Little is a 52 y.o. female admitted on 04/24/2018 s/p  Irrigation and debridement of lumbar wound  Pharmacy has been consulted for Vancomycin dosing for surgical prophylaxis.  Plan: Vancomycin 1589m IV q12h Vanc troughs as needed Monitor clinical course and renal function  Height: 5' 10.5" (179.1 cm) Weight: (!) 330 lb (149.7 kg) IBW/kg (Calculated) : 69.65  Temp (24hrs), Avg:98.1 F (36.7 C), Min:97.3 F (36.3 C), Max:99.1 F (37.3 C)  Recent Labs  Lab 04/23/18 1150  WBC 8.1  CREATININE 0.64    Estimated Creatinine Clearance: 133.6 mL/min (by C-G formula based on SCr of 0.64 mg/dL).    Allergies  Allergen Reactions  . Caine-1 [Lidocaine] Swelling and Rash    Eyes swell shut; includes all caine drugs except marcaine. EMLA cream OK though (?!)  . Sulfa Antibiotics Nausea And Vomiting and Rash  . Adhesive [Tape] Other (See Comments)    Blisters - can use paper tape  . Doxycycline Nausea Only  . Flagyl [Metronidazole] Nausea Only  . Iron Nausea Only  . Oxycodone Other (See Comments)    NIGHTMARES. (tolerates hydrocodone or tramadol better)  . Penicillins Rash    Has patient had a PCN reaction causing immediate rash, facial/tongue/throat swelling, SOB or lightheadedness with hypotension: no Has patient had a PCN reaction causing severe rash involving mucus membranes or skin necrosis: no Has patient had a PCN reaction that required hospitalization no Has patient had a PCN reaction occurring within the last 10 years: no If all of the above answers are "NO", then may proceed with Cephalosporin use.     Lindsay Little A. PLevada Dy PharmD, BTrentonPager: [phone] utilize Amion for appropriate phone number to reach the unit pharmacist ([place]   04/24/2018 11:07 AM

## 2018-04-24 NOTE — Transfer of Care (Signed)
Immediate Anesthesia Transfer of Care Note  Patient: Lindsay Little  Procedure(s) Performed: wound exploration, irrigation and debridement (N/A Spine Lumbar)  Patient Location: PACU  Anesthesia Type:General  Level of Consciousness: awake, oriented and patient cooperative  Airway & Oxygen Therapy: Patient Spontanous Breathing and Patient connected to nasal cannula oxygen  Post-op Assessment: Report given to RN, Post -op Vital signs reviewed and stable and Patient moving all extremities X 4  Post vital signs: Reviewed and stable  Last Vitals:  Vitals Value Taken Time  BP 157/80 04/24/2018  9:42 AM  Temp    Pulse 85 04/24/2018  9:43 AM  Resp 12 04/24/2018  9:43 AM  SpO2 97 % 04/24/2018  9:43 AM  Vitals shown include unvalidated device data.  Last Pain:  Vitals:   04/24/18 0717  TempSrc:   PainSc: 0-No pain         Complications: No apparent anesthesia complications

## 2018-04-24 NOTE — Anesthesia Preprocedure Evaluation (Addendum)
Anesthesia Evaluation  Patient identified by MRN, date of birth, ID band Patient awake    Reviewed: Allergy & Precautions, NPO status , Patient's Chart, lab work & pertinent test results  History of Anesthesia Complications (+) PONV and history of anesthetic complications  Airway Mallampati: II  TM Distance: >3 FB Neck ROM: Full    Dental no notable dental hx. (+) Teeth Intact, Dental Advisory Given   Pulmonary sleep apnea (mild, no CPAP) , former smoker,    Pulmonary exam normal breath sounds clear to auscultation       Cardiovascular hypertension, Pt. on medications and Pt. 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    GI/Hepatic Neg liver ROS, GERD  Controlled and Medicated,  Endo/Other  Morbid obesityBMI 49  Renal/GU negative Renal ROS  negative genitourinary   Musculoskeletal  (+) Arthritis , Osteoarthritis,    Abdominal (+) + obese,   Peds negative pediatric ROS (+)  Hematology negative hematology ROS (+)   Anesthesia Other Findings   Reproductive/Obstetrics negative OB ROS                            Anesthesia Physical Anesthesia Plan  ASA: III  Anesthesia Plan: General   Post-op Pain Management:    Induction:   PONV Risk Score and Plan: 3 and 4 or greater and Scopolamine patch - Pre-op, Ondansetron, Dexamethasone and Treatment may vary due to age or medical condition  Airway Management Planned: Oral ETT  Additional Equipment:   Intra-op Plan:   Post-operative Plan: Extubation in OR  Informed Consent: I have reviewed the patients History and Physical, chart, labs and discussed the procedure including the risks, benefits and alternatives for the proposed anesthesia with the patient or authorized representative who has indicated his/her understanding and acceptance.    Dental advisory given  Plan Discussed with: CRNA and Surgeon  Anesthesia Plan Comments: (Hx of PONV, does well with scopolamine patch)       Anesthesia Quick Evaluation

## 2018-04-24 NOTE — Discharge Summary (Signed)
Physician Discharge Summary  [name] MRN: [id number] DOB/AGE: [birth year] 52 y.o.  Admit date: 04/24/2018 Discharge date: 04/24/2018  Admission Diagnoses: wound seroma    Discharge Diagnoses: same   Discharged Condition: good  Hospital Course: The patient was admitted on 04/24/2018 and taken to the operating room where the patient underwent lumbar I&D for seroma. The patient tolerated the procedure well and was taken to the recovery room and then to the floor in stable condition. The hospital course was routine. There were no complications. The wound remained clean dry and intact. Pt had appropriate back soreness. No complaints of leg pain or new N/T/W. The patient remained afebrile with stable vital signs, and tolerated a regular diet. The patient continued to increase activities, and pain was well controlled with oral pain medications.   Consults: none  Significant Diagnostic Studies:  Results for orders placed or performed during the hospital encounter of 04/24/18  Aerobic/Anaerobic Culture (surgical/deep wound)  Result Value Ref Range   Specimen Description WOUND LUMBAR    Special Requests NONE    Gram Stain      NO WBC SEEN NO ORGANISMS SEEN Performed at [facility], [place] [address], [place], [place] [address]    Culture PENDING    Report Status PENDING     Mr Lumbar Spine W Wo Contrast  Result Date: 04/10/2018 CLINICAL DATA:  52 year old female status post surgery on 02/12/2018 with postoperative wound infection. Low back pain radiating to both hips. Bilateral leg weakness and fever. EXAM: MRI LUMBAR SPINE WITHOUT AND WITH CONTRAST TECHNIQUE: Multiplanar and multiecho pulse sequences of the lumbar spine were obtained without and with intravenous contrast. CONTRAST:  6m MULTIHANCE GADOBENATE DIMEGLUMINE 529 MG/ML IV SOLN COMPARISON:  Lumbar radiographs 03/30/2018. Intraoperative images 8119. Outside preoperative lumbar MRI 11/25/2017. FINDINGS:  Segmentation:  Normal on the comparison radiographs. Alignment: Decreased anterolisthesis of L4 on L5 since the preoperative MRI. Moderate levoconvex lumbar scoliosis appears stable. Vertebrae: Mild hardware susceptibility artifact at L4-L5, but there does appear to be vertebral body marrow edema and enhancement at both levels (series 3, image 8). Superimposed postoperative changes to the posterior elements, described more below. No other convincing marrow edema or acute osseous abnormality. Normal underlying bone marrow signal. Intact visible sacrum. Visible SI joints are within normal limits. Conus medullaris and cauda equina: Conus extends to the T12 level. No lower spinal cord or conus signal abnormality. No abnormal intradural enhancement. No definite dural thickening. Paraspinal and other soft tissues: Fairly large thick-walled and rim enhancing fluid collection in the posterior subcutaneous fat extends from the L3-L4 level inferiorly, tracking caudally to the right and terminating on series 3, image 1 and series 9, image 33. This measures 45 x 72 x 93 millimeters for an estimated volume of 150 mL. Thick-walled rim enhancement, but internally the fluid appears largely simple with scattered intrinsic T1 hyperintense septations. Mild generalized appearing subcutaneous edema, and not definitely related to the collection. It is unclear whether the fluid collection may communicate with the skin surface, it is nearest the skin surface at the L5 level (about 15 millimeters deep). See additional L4-L5 details below. Stable and negative visible abdominal viscera. Disc levels: T11-T12: Stable disc desiccation, disc bulge and mild endplate spurring. Stable mild to moderate facet hypertrophy with up to mild spinal and bilateral foraminal stenosis. T12-L1:  Negative. L1-L2:  Negative. L2-L3: Circumferential disc bulge with small left lateral recess disc extrusion (series 6, image 15). Mild to moderate facet hypertrophy  greater  on the left. Left lateral recess stenosis appears mildly progressed and moderate (left L3 nerve level). No significant spinal or foraminal stenosis. L3-L4: Stable circumferential disc bulge with mild to moderate facet hypertrophy. Stable borderline to mild spinal, bilateral lateral recess and bilateral foraminal stenosis at this level. L4-L5: Postoperative changes from posterior decompression, interbody implant and bilateral transpedicular hardware placement. There does appear to be a small volume of fluid along the left L5 dorsal pedicle screw but this does not definitely communicate to the large subcutaneous collection, and no other deep paraspinal fluid is identified. Epidural space granulation tissue. Improved thecal sac patency with no spinal or lateral recess stenosis. Bilateral L4 foraminal stenosis also appears improved. L5-S1: Negative disc. Stable mild to moderate facet hypertrophy. No stenosis. IMPRESSION: 1. Rim enhancing fluid collection with an estimated volume of 150 mL located in the subcutaneous fat overlying L4 and L5 with no definite communication to the deeper paraspinal soft tissues. This could be a large seroma or abscess. 2. Status post decompression and fusion at L4-L5 with vertebral body marrow edema which is favored to be postoperative in nature. No epidural fluid collection or abscess. Resolved spinal stenosis and improved foraminal patency at this level. 3. Other lumbar levels are stable since the preoperative MRI, aside from perhaps mild progression of the left lateral recess disc protrusion at L2-L3, left L3 nerve level. Electronically Signed   By: Genevie Ann M.D.   On: 04/10/2018 10:29   Dg Fluoro Guided Needle Plc Aspiration/injection Loc  Result Date: 04/15/2018 CLINICAL DATA:  Postoperative fluid collection. FLUOROSCOPY TIME:  27 seconds corresponding to a Dose Area Product of 155.71 Gy*m2 PROCEDURE: POSTERIOR PARAVERTEBRAL FLUID COLLECTION AND ASPIRATION UNDER FLUOROSCOPY  Informed written consent was obtained.  Time-out was performed. An appropriate skin entrance site was determined. The site was marked, prepped with Betadine, draped in the usual sterile fashion, and infiltrated locally with 1% lidocaine. Initially a 20 gauge needle, followed by an 69 gauge spinal needle, was advanced to the expected location of the fluid collection as triangulated from prior MR. I was able to aspirate approximately 25 mL of serosanguineous fluid, slightly blood tinged, but not grossly purulent. The fluid was somewhat thick. Postprocedure, the patient endorsed improvement of the fullness sensation in the back. IMPRESSION: Technically successful dorsal paravertebral fluid aspiration yielding approximately 25 mL of serosanguineous fluid, not grossly purulent. Electronically Signed   By: Staci Righter M.D.   On: 04/15/2018 14:20    Antibiotics:  Anti-infectives (From admission, onward)   Start     Dose/Rate Route Frequency Ordered Stop   04/24/18 2100  vancomycin (VANCOCIN) 1,500 mg in sodium chloride 0.9 % 500 mL IVPB     1,500 mg 250 mL/hr over 120 Minutes Intravenous Every 12 hours 04/24/18 1110     04/24/18 0911  vancomycin (VANCOCIN) powder  Status:  Discontinued       As needed 04/24/18 0911 04/24/18 0937   04/24/18 0838  bacitracin 50,000 Units in sodium chloride 0.9 % 500 mL irrigation  Status:  Discontinued       As needed 04/24/18 0839 04/24/18 0937   04/24/18 0700  vancomycin (VANCOCIN) 1,500 mg in sodium chloride 0.9 % 500 mL IVPB     1,500 mg 250 mL/hr over 120 Minutes Intravenous To [facility] 04/23/18 1157 04/24/18 1124      Discharge Exam: Blood pressure (!) 118/56, pulse 68, temperature 98.6 F (37 C), temperature source Oral, resp. rate 16, height 5' 10.5" (1.791 m), weight Marland Kitchen)  149.7 kg, SpO2 100 %. Neurologic: Grossly normal Dressing dry  Discharge Medications:   Allergies as of 04/24/2018      Reactions   Caine-1 [lidocaine] Swelling, Rash   Eyes  swell shut; includes all caine drugs except marcaine. EMLA cream OK though (?!)   Sulfa Antibiotics Nausea And Vomiting, Rash   Adhesive [tape] Other (See Comments)   Blisters - can use paper tape   Doxycycline Nausea Only   Flagyl [metronidazole] Nausea Only   Iron Nausea Only   Oxycodone Other (See Comments)   NIGHTMARES. (tolerates hydrocodone or tramadol better)   Penicillins Rash   Has patient had a PCN reaction causing immediate rash, facial/tongue/throat swelling, SOB or lightheadedness with hypotension: no Has patient had a PCN reaction causing severe rash involving mucus membranes or skin necrosis: no Has patient had a PCN reaction that required hospitalization no Has patient had a PCN reaction occurring within the last 10 years: no If all of the above answers are "NO", then may proceed with Cephalosporin use.      Medication List    STOP taking these medications   doxycycline 100 MG capsule Commonly known as:  VIBRAMYCIN     TAKE these medications   carvedilol 3.125 MG tablet Commonly known as:  COREG Take 3.125 mg by mouth 2 (two) times daily.   CoQ10 200 MG Caps Take 200 mg by mouth at bedtime.   DULoxetine 60 MG capsule Commonly known as:  CYMBALTA Take 120 mg by mouth at bedtime.   esomeprazole 40 MG capsule Commonly known as:  NEXIUM Take 1 capsule (40 mg total) by mouth 2 (two) times daily before a meal.   furosemide 20 MG tablet Commonly known as:  LASIX Take 20 mg by mouth See admin instructions. Take 20 mg by mouth up to twice weekly as needed for swelling   gabapentin 300 MG capsule Commonly known as:  NEURONTIN Take 300-600 mg by mouth See admin instructions. Take 300 mg in the morning and afternoon and 600 mg at night   HYDROcodone-acetaminophen 5-325 MG tablet Commonly known as:  NORCO/VICODIN Take 1-2 tablets by mouth every 6 (six) hours as needed for moderate pain.   Melatonin 3 MG Tabs Take 3 mg by mouth at bedtime.   meloxicam 15 MG  tablet Commonly known as:  MOBIC Take 15 mg by mouth at bedtime.   methocarbamol 500 MG tablet Commonly known as:  ROBAXIN Take 2 tablets (1,000 mg total) by mouth every 6 (six) hours as needed for muscle spasms.   multivitamin with minerals Tabs tablet Take 2 tablets by mouth at bedtime.   polyethylene glycol packet Commonly known as:  MIRALAX / GLYCOLAX Take 17 g by mouth daily as needed for moderate constipation.   rosuvastatin 5 MG tablet Commonly known as:  CRESTOR Take 5 mg by mouth at bedtime.   traMADol 50 MG tablet Commonly known as:  ULTRAM Take 50 mg by mouth every 6 (six) hours as needed for moderate pain.   triamcinolone lotion 0.1 % Commonly known as:  KENALOG Apply 1 application topically as needed (dry skin).   Vitamin D3 5000 units Caps Take 5,000 Units by mouth at bedtime.       Disposition: home   Final Dx: wound seroma  Discharge Instructions     Remove dressing in 72 hours   Complete by:  As directed    Call MD for:  difficulty breathing, headache or visual disturbances   Complete by:  As  directed    Call MD for:  persistant nausea and vomiting   Complete by:  As directed    Call MD for:  redness, tenderness, or signs of infection (pain, swelling, redness, odor or green/yellow discharge around incision site)   Complete by:  As directed    Call MD for:  severe uncontrolled pain   Complete by:  As directed    Call MD for:  temperature >100.4   Complete by:  As directed    Diet - low sodium heart healthy   Complete by:  As directed    Increase activity slowly   Complete by:  As directed       Follow-up Information    Eustace Moore, MD. Schedule an appointment as soon as possible for a visit in 2 week(s).   Specialty:  Neurosurgery Contact information: [address] [place] [address] [place] [address] [phone]            Signed: Eustace Moore 04/24/2018, 1:48 PM

## 2018-04-24 NOTE — CV Procedure (Signed)
04/24/2018  9:37 AM  PATIENT:  [name]  52 y.o. female  PRE-OPERATIVE DIAGNOSIS:   Post operative wound seroma, lumbar deep tissue  POST-OPERATIVE DIAGNOSIS:  same  PROCEDURE:   Irrigation and debridement of lumbar wound  SURGEON:  Sherley Bounds, MD  ASSISTANTS: Glenford Peers FNP  ANESTHESIA:   General  EBL: 30 ml  Total I/O In: 500 [I.V.:500] Out: 30 [Blood:30]  BLOOD ADMINISTERED: none  DRAINS: Medium Hemovac  SPECIMEN:  none  INDICATION FOR PROCEDURE: This patient presented with history of a superficial lumbar wound infection.  MRI showed a deep fluid collection.  Sed rate and CRP had been high but were coming down with doxycycline treatment.  White count had normalized.  She still had some pain. The patient tried conservative measures without relief. Pain was debilitating. Recommended exploration of the wound for removal of the tissue fluid collection. Patient understood the risks, benefits, and alternatives and potential outcomes and wished to proceed.  PROCEDURE DETAILS: The patient was taken to the operating room and after induction of adequate generalized endotracheal anesthesia, the patient was rolled into the prone position on the Wilson frame and all pressure points were padded. The lumbar region was cleaned and then prepped with DuraPrep and draped in the usual sterile fashion. 5 cc of local anesthesia was injected and then a dorsal midline incision was made and carried down to the lumbo sacral fascia.  Encountered a large fluid-filled space with boggy tissues.  This was fairly clear fluid.  There is no purulence.  Was no opening in the fascia.  We cultured the tissues.  And then IV vancomycin was given.  The operative bed was fairly clean.  We debrided the soft tissues.  We cut the tissues back to bleeding surfaces.  We irrigated with saline solution containing bacitracin.  Used a medium Hemovac drain and 1 g of powdered vancomycin.  We then closed the deep  subcutaneous tissue with 0 Vicryl. I closed the more superficial subcutaneous tissues with 2-0 Vicryl and the subcuticular tissues with 3-0 Vicryl. The skin was then closed with benzoin and Steri-Strips. The drapes were removed, a sterile dressing was applied. The patient was awakened from general anesthesia and transferred to the recovery room in stable condition. At the end of the procedure all sponge, needle and instrument counts were correct.    PLAN OF CARE: Admit for overnight observation  PATIENT DISPOSITION:  PACU - hemodynamically stable.   Delay start of Pharmacological VTE agent (>24hrs) due to surgical blood loss or risk of bleeding:  yes

## 2018-04-24 NOTE — Anesthesia Postprocedure Evaluation (Signed)
Anesthesia Post Note  Patient: Lindsay Little  Procedure(s) Performed: wound exploration, irrigation and debridement (N/A Spine Lumbar)     Patient location during evaluation: PACU Anesthesia Type: General Level of consciousness: awake and alert Pain management: pain level controlled Vital Signs Assessment: post-procedure vital signs reviewed and stable Respiratory status: spontaneous breathing, nonlabored ventilation and respiratory function stable Cardiovascular status: blood pressure returned to baseline and stable Postop Assessment: no apparent nausea or vomiting Anesthetic complications: no    Last Vitals:  Vitals:   04/24/18 0945 04/24/18 1000  BP: (!) 157/80 (!) 147/86  Pulse: 91 81  Resp: (!) 21 13  Temp: (!) 36.3 C   SpO2: 92% 99%    Last Pain:  Vitals:   04/24/18 1000  TempSrc:   PainSc: [place]

## 2018-04-24 NOTE — Progress Notes (Signed)
Patient is discharged from room 3C11 at this time. Alert and in stable condition. IV site d/c'd and instructions read to patient and spouse with understanding verbalized. Left unit via wheelchair with all belongings at side.

## 2018-04-24 NOTE — Discharge Instructions (Signed)
Wound Care Leave incision open to air. You may shower. Do not scrub directly on incision.  Do not put any creams, lotions, or ointments on incision. Activity Walk each and every day, increasing distance each day. No lifting greater than 5 lbs.  Avoid bending, arching, and twisting. No driving for 2 weeks; may ride as a passenger locally. If provided with back brace, wear when out of bed.  It is not necessary to wear in bed. Diet Resume your normal diet.  Return to Work Will be discussed at you follow up appointment. Call Your Doctor If Any of These Occur Redness, drainage, or swelling at the wound.  Temperature greater than 101 degrees. Severe pain not relieved by pain medication. Incision starts to come apart. Follow Up Appt Call today for appointment in 1-2 weeks ([phone]) or for problems.  If you have any hardware placed in your spine, you will need an x-ray before your appointment.

## 2018-04-24 NOTE — H&P (Signed)
Subjective: Patient is a 52 y.o. female admitted for I &D lumbar wound. Onset of symptoms was a few weeks ago, gradually improving since that time.  The pain is rated moderate, and is located at the across the lower back. The pain is described as aching and occurs intermittently. The symptoms have been progressive. Symptoms are exacerbated by exercise. MRI or CT showed fluid collection in deep tissues, sed rate and crp have been high despite doxycycline  Past Medical History:  Diagnosis Date  . Anxiety   . Chemotherapy-induced neuropathy (HCC)    toes and fingers numbness and tingling  . Colostomy in place [facility]) 07/12/2016   due to anastomosis breakdown w/ colovaginal fistula  . Complication of anesthesia   . Depression   . Family history of adverse reaction to anesthesia    mother-- ponv  . Family history of breast cancer   . Family history of pancreatic cancer   . Family history of skin cancer   . Gastroenteritis 12/20/2015  . GERD (gastroesophageal reflux disease)   . Hiatal hernia   . History of cancer chemotherapy 02-20-2015 to 03-30-2015  . History of cardiac murmur as a child   . History of chemotherapy   . History of chronic gastritis   . History of hypertension    no issues since multiple abdminal sx's and chemo--- no medication since 12/ 2016  . History of TMJ disorder   . Hypercholesteremia   . Hypertension   . IBS (irritable bowel syndrome)    dx age 30  . Intermittent abdominal pain    post-op multiple abdominal sx's   . Microcytic anemia   . Mild sleep apnea    per pt study 2014  very mild osa , no cpap recommended, recommeded wt loss and sleep routine  . OA (osteoarthritis)    left knee /  left shoulder  . Obesity   . PONV (postoperative nausea and vomiting)    severe" needs Scopolamine PATCH"   . Portacath in place    right chest  . Rectal adenocarcinoma (Eugenio Saenz) oncologis-  dr Burr Medico-- after radiation/ chemo (ypT1, N0) --  no recurrence per last note 03/ 2018   dx 01-13-2015-- Stage IIIC (T3, N2, M0) post concurrent radiation and chemotherapy 02-20-2015 to 03-30-2015 /  05-25-2015 s/p  LAR w/ RSO (post-op complicated by late abscess and contained anatomotic leak w/ help percutaneous drainage and antibiotics)   . Rotator cuff tear, left   . S/P radiation therapy 02/20/15-03/30/15   colon/rectal  . Vitamin D deficiency   . Wears glasses   . Wears glasses     Past Surgical History:  Procedure Laterality Date  . ABDOMINAL HYSTERECTOMY  1996   uterus and cervix  . BACK SURGERY  02/12/2018   lumbar surgery  . COLON RESECTION N/A 07/12/2016   Procedure: LAPAROSCOPIC LYSIS OF ADHESIONS, OMENTOPEXY, HAND ASSISTED RESECTION OF  COLON, END TO END ANASTOMOSIS, COLOSTOMY;  Surgeon: Michael Boston, MD;  Location: [facility];  Service: General;  Laterality: N/A;  . COMBINED HYSTEROSCOPY DIAGNOSTIC / D&C  x2 1990's  . DIAGNOSTIC LAPAROSCOPY  age 52 and age 65  . EUS N/A 01/18/2015   Procedure: LOWER ENDOSCOPIC ULTRASOUND (EUS);  Surgeon: Arta Silence, MD;  Location: [facility];  Service: Endoscopy;  Laterality: N/A;  . EXCISION OF SKIN TAG  11/17/2015   Procedure: EXCISION OF SKIN TAG;  Surgeon: Michael Boston, MD;  Location: [facility];  Service: General;;  . ILEO LOOP COLOSTOMY CLOSURE N/A 11/17/2015  Procedure: LAPAROSCOPIC DIVERTING LOOP ILEOSTOMY  DRAINAGE OF PELVIC ABSCESS;  Surgeon: Michael Boston, MD;  Location: [facility];  Service: General;  Laterality: N/A;  . ILEOSTOMY CLOSURE N/A 05/31/2016   Procedure: TAKEDOWN LOOP ILEOSTOMY;  Surgeon: Michael Boston, MD;  Location: [facility];  Service: General;  Laterality: N/A;  . IMPACTION REMOVAL  11/17/2015   Procedure: DISIMPACTION REMOVAL;  Surgeon: Michael Boston, MD;  Location: [facility];  Service: General;;  . KNEE ARTHROSCOPY Left 1990's  . KNEE ARTHROSCOPY W/ MENISCECTOMY Left 09/14/2009   and chondroplasty debridement  . LAPAROSCOPIC LYSIS OF ADHESIONS  11/17/2015   Procedure: LAPAROSCOPIC LYSIS OF ADHESIONS;  Surgeon: Michael Boston, MD;  Location: [facility];  Service: General;;  . PORT-A-CATH REMOVAL N/A 11/21/2016   Procedure: REMOVAL PORT-A-CATH;  Surgeon: Michael Boston, MD;  Location: [facility];  Service: General;  Laterality: N/A;  . PORTACATH PLACEMENT N/A 05/25/2015   Procedure: INSERTION PORT-A-CATH;  Surgeon: Michael Boston, MD;  Location: [facility];  Service: General;  Laterality: N/A;-remains inplace Right chest.  . ROTATOR CUFF REPAIR Right 2006  . TONSILLECTOMY  age 61  . XI ROBOTIC ASSISTED LOWER ANTERIOR RESECTION N/A 05/25/2015   Procedure: XI ROBOTIC ASSISTED LOWER ANTERIOR RESECTION, , RIGID PROCTOSCOPY, RIGHT OOPHORECTOMY;  Surgeon: Michael Boston, MD;  Location: [facility];  Service: General;  Laterality: N/A;    Prior to Admission medications   Medication Sig Start Date End Date Taking? Authorizing Provider  carvedilol (COREG) 3.125 MG tablet Take 3.125 mg by mouth 2 (two) times daily. 03/10/18  Yes [provider]  Cholecalciferol (VITAMIN D3) 5000 units CAPS Take 5,000 Units by mouth at bedtime.    Yes [provider]  Coenzyme Q10 (COQ10) 200 MG CAPS Take 200 mg by mouth at bedtime.   Yes [provider]  doxycycline (VIBRAMYCIN) 100 MG capsule Take 100 mg by mouth 2 (two) times daily.   Yes [provider]  DULoxetine (CYMBALTA) 60 MG capsule Take 120 mg by mouth at bedtime.    Yes [provider]  esomeprazole (NEXIUM) 40 MG capsule Take 1 capsule (40 mg total) by mouth 2 (two) times daily before a meal. 12/22/15  Yes Gross, Remo Lipps, MD  furosemide (LASIX) 20 MG tablet Take 20 mg by mouth See admin instructions. Take 20 mg by mouth up to twice weekly as needed for swelling   Yes [provider]  gabapentin (NEURONTIN) 300 MG capsule Take 300-600 mg by mouth See admin instructions. Take 300 mg in the morning and afternoon and 600 mg at night   Yes [provider]  HYDROcodone-acetaminophen (NORCO/VICODIN) 5-325 MG tablet Take 1-2  tablets by mouth every 6 (six) hours as needed for moderate pain. 02/13/18  Yes Eustace Moore, MD  Melatonin 3 MG TABS Take 3 mg by mouth at bedtime.    Yes [provider]  meloxicam (MOBIC) 15 MG tablet Take 15 mg by mouth at bedtime.    Yes [provider]  methocarbamol (ROBAXIN) 500 MG tablet Take 2 tablets (1,000 mg total) by mouth every 6 (six) hours as needed for muscle spasms. 02/13/18  Yes Eustace Moore, MD  Multiple Vitamin (MULTIVITAMIN WITH MINERALS) TABS tablet Take 2 tablets by mouth at bedtime.    Yes [provider]  polyethylene glycol (MIRALAX / GLYCOLAX) packet Take 17 g by mouth daily as needed for moderate constipation.   Yes [provider]  rosuvastatin (CRESTOR) 5 MG tablet Take 5 mg by mouth at  bedtime.   Yes [provider]  traMADol (ULTRAM) 50 MG tablet Take 50 mg by mouth every 6 (six) hours as needed for moderate pain.  12/25/16  Yes [provider]  triamcinolone lotion (KENALOG) 0.1 % Apply 1 application topically as needed (dry skin).   Yes [provider]   Allergies  Allergen Reactions  . Caine-1 [Lidocaine] Swelling and Rash    Eyes swell shut; includes all caine drugs except marcaine. EMLA cream OK though (?!)  . Sulfa Antibiotics Nausea And Vomiting and Rash  . Adhesive [Tape] Other (See Comments)    Blisters - can use paper tape  . Doxycycline Nausea Only  . Flagyl [Metronidazole] Nausea Only  . Iron Nausea Only  . Oxycodone Other (See Comments)    NIGHTMARES. (tolerates hydrocodone or tramadol better)  . Penicillins Rash    Has patient had a PCN reaction causing immediate rash, facial/tongue/throat swelling, SOB or lightheadedness with hypotension: no Has patient had a PCN reaction causing severe rash involving mucus membranes or skin necrosis: no Has patient had a PCN reaction that required hospitalization no Has patient had a PCN reaction occurring within the last 10 years: no If all of  the above answers are "NO", then may proceed with Cephalosporin use.     Social History   Tobacco Use  . Smoking status: Former Smoker    Packs/day: 0.25    Years: 7.00    Pack years: 1.75    Types: Cigarettes    Last attempt to quit: 07/16/1991    Years since quitting: 26.7  . Smokeless tobacco: Never Used  Substance Use Topics  . Alcohol use: No    Family History  Problem Relation Age of Onset  . Coronary artery disease Mother 18  . Hypertension Mother   . Hyperlipidemia Mother   . Diabetes Mellitus I Mother   . Coronary artery disease Father   . Hyperlipidemia Father   . Hypertension Father   . Cancer Sister        skin - non melanoma  . Hyperlipidemia Brother   . Cancer Maternal Uncle 52       pancreatic with mets to colon and prostate  . Cirrhosis Maternal Uncle   . Hypertension Maternal Grandmother   . Diabetes Mellitus I Maternal Grandmother   . Hyperlipidemia Maternal Grandmother   . CVA Maternal Grandmother   . Hypertension Maternal Grandfather   . Coronary artery disease Maternal Grandfather 65  . Hyperlipidemia Maternal Grandfather   . Coronary artery disease Paternal Grandmother   . Hypertension Paternal Grandmother   . Hyperlipidemia Paternal Grandmother   . Diabetes Mellitus I Paternal Grandmother   . Hypertension Paternal Grandfather   . Hyperlipidemia Paternal Grandfather   . Coronary artery disease Paternal Grandfather      Review of Systems  Positive ROS: neg  All other systems have been reviewed and were otherwise negative with the exception of those mentioned in the HPI and as above.  Objective: Vital signs in last 24 hours: Temp:  [98.5 F (36.9 C)-99.1 F (37.3 C)] 98.5 F (36.9 C) (10/11 0646) Pulse Rate:  [85-98] 85 (10/11 0646) Resp:  [20] 20 (10/11 0646) BP: (157-162)/(84-87) 162/84 (10/11 0646) SpO2:  [97 %-100 %] 100 % (10/11 0646) Weight:  [149.7 kg-150.3 kg] 149.7 kg (10/11 0646)  General Appearance: Alert, cooperative, no  distress, appears stated age Head: Normocephalic, without obvious abnormality, atraumatic Eyes: PERRL, conjunctiva/corneas clear, EOM's intact    Neck: Supple, symmetrical, trachea midline  Back: Symmetric, no curvature, ROM normal, no CVA tenderness Lungs:  respirations unlabored Heart: Regular rate and rhythm Abdomen: Soft, non-tender Extremities: Extremities normal, atraumatic, no cyanosis or edema Pulses: 2+ and symmetric all extremities Skin: Skin color, texture, turgor normal, no rashes or lesions  NEUROLOGIC:   Mental status: Alert and oriented x4,  no aphasia, good attention span, fund of knowledge, and memory Motor Exam - grossly normal Sensory Exam - grossly normal Reflexes: 1+ Coordination - grossly normal Gait - grossly normal Balance - grossly normal Cranial Nerves: I: smell Not tested  II: visual acuity  OS: nl    OD: nl  II: visual fields Full to confrontation  II: pupils Equal, round, reactive to light  III,VII: ptosis None  III,IV,VI: extraocular muscles  Full ROM  V: mastication Normal  V: facial light touch sensation  Normal  V,VII: corneal reflex  Present  VII: facial muscle function - upper  Normal  VII: facial muscle function - lower Normal  VIII: hearing Not tested  IX: soft palate elevation  Normal  IX,X: gag reflex Present  XI: trapezius strength  5/5  XI: sternocleidomastoid strength 5/5  XI: neck flexion strength  5/5  XII: tongue strength  Normal    Data Review Lab Results  Component Value Date   WBC 8.1 04/23/2018   HGB 12.6 04/23/2018   HCT 41.5 04/23/2018   MCV 87.9 04/23/2018   PLT 322 04/23/2018   Lab Results  Component Value Date   NA 139 04/23/2018   K 4.5 04/23/2018   CL 104 04/23/2018   CO2 24 04/23/2018   BUN 10 04/23/2018   CREATININE 0.64 04/23/2018   GLUCOSE 95 04/23/2018   Lab Results  Component Value Date   INR 0.95 02/03/2018    Assessment/Plan:  Estimated body mass index is 46.68 kg/m as calculated from  the following:   Height as of this encounter: 5' 10.5" (1.791 m).   Weight as of this encounter: 149.7 kg. Patient admitted for I&D lumbar wound. Patient has failed a reasonable attempt at conservative therapy.  I explained the condition and procedure to the patient and answered any questions.  Patient wishes to proceed with procedure as planned. Understands risks/ benefits and typical outcomes of procedure.   Keosha Rossa S 04/24/2018 8:15 AM

## 2018-04-24 NOTE — Anesthesia Procedure Notes (Signed)
Procedure Name: Intubation Date/Time: 04/24/2018 8:35 AM Performed by: Lowella Dell, CRNA Pre-anesthesia Checklist: Patient identified, Emergency Drugs available, Suction available and Patient being monitored Patient Re-evaluated:Patient Re-evaluated prior to induction Oxygen Delivery Method: Circle System Utilized Preoxygenation: Pre-oxygenation with 100% oxygen Induction Type: IV induction Ventilation: Mask ventilation without difficulty Laryngoscope Size: Mac and 3 Grade View: Grade II Tube type: Oral Tube size: 7.0 mm Number of attempts: 1 Airway Equipment and Method: Stylet and Oral airway Placement Confirmation: ETT inserted through vocal cords under direct vision,  positive ETCO2 and breath sounds checked- equal and bilateral Secured at: 22 cm Tube secured with: Tape Dental Injury: Teeth and Oropharynx as per pre-operative assessment

## 2018-04-25 ENCOUNTER — Encounter (HOSPITAL_COMMUNITY): Payer: Self-pay | Admitting: Neurological Surgery

## 2018-04-29 LAB — AEROBIC/ANAEROBIC CULTURE (SURGICAL/DEEP WOUND): CULTURE: NO GROWTH

## 2018-04-29 LAB — AEROBIC/ANAEROBIC CULTURE W GRAM STAIN (SURGICAL/DEEP WOUND): Gram Stain: NONE SEEN

## 2018-04-30 ENCOUNTER — Encounter (HOSPITAL_COMMUNITY): Admission: RE | Payer: Self-pay | Source: Ambulatory Visit

## 2018-04-30 ENCOUNTER — Ambulatory Visit (HOSPITAL_COMMUNITY): Admission: RE | Admit: 2018-04-30 | Payer: BLUE CROSS/BLUE SHIELD | Source: Ambulatory Visit | Admitting: Surgery

## 2018-04-30 SURGERY — REPAIR, HERNIA, VENTRAL, LAPAROSCOPIC
Anesthesia: General

## 2018-06-15 ENCOUNTER — Inpatient Hospital Stay: Payer: Medicare Other

## 2018-06-15 ENCOUNTER — Telehealth: Payer: Self-pay | Admitting: Hematology

## 2018-06-15 ENCOUNTER — Inpatient Hospital Stay: Payer: Medicare Other | Admitting: Hematology

## 2018-06-15 ENCOUNTER — Telehealth: Payer: Self-pay

## 2018-06-15 NOTE — Telephone Encounter (Signed)
Patient calls to cancel her appointments for today as she is sick, high priority scheduling message sent to cancel and reschedule.

## 2018-06-15 NOTE — Telephone Encounter (Signed)
Cancelled appt per 12/2 sch message - pt felling sick - left message for patient to call back to r/s

## 2018-06-23 ENCOUNTER — Telehealth: Payer: Self-pay | Admitting: Hematology

## 2018-06-23 NOTE — Telephone Encounter (Signed)
Returned patient. Per patient she would like to reschedule the cancelled December appointment w/[facility] prior to her 1/9 surgery. Gave patient appointment for lab/fu 07/21/18.

## 2018-07-16 NOTE — Patient Instructions (Addendum)
Lindsay Little  07/16/2018   Your procedure is scheduled on: 07-23-18    Report to [facility] Main  Entrance     Report to admitting at 5:30AM    Call this number if you have problems the morning of surgery [phone]      Remember: Do not eat food or drink liquids :After Midnight. BRUSH YOUR TEETH MORNING OF SURGERY AND RINSE YOUR MOUTH OUT, NO CHEWING GUM CANDY OR MINTS.     Take these medicines the morning of surgery with A SIP OF WATER:  gabapentin                                You may not have any metal on your body including hair pins and              piercings  Do not wear jewelry, make-up, lotions, powders or perfumes, deodorant             Do not wear nail polish.  Do not shave  48 hours prior to surgery.     Do not bring valuables to the hospital. [place].  Contacts, dentures or bridgework may not be worn into surgery.  Leave suitcase in the car. After surgery it may be brought to your room.                   Please read over the following fact sheets you were given: _____________________________________________________________________             [facility] - Preparing for Surgery Before surgery, you can play an important role.  Because skin is not sterile, your skin needs to be as free of germs as possible.  You can reduce the number of germs on your skin by washing with CHG (chlorahexidine gluconate) soap before surgery.  CHG is an antiseptic cleaner which kills germs and bonds with the skin to continue killing germs even after washing. Please DO NOT use if you have an allergy to CHG or antibacterial soaps.  If your skin becomes reddened/irritated stop using the CHG and inform your nurse when you arrive at Short Stay. Do not shave (including legs and underarms) for at least 48 hours prior to the first CHG shower.  You may shave your face/neck. Please follow these instructions  carefully:  1.  Shower with CHG Soap the night before surgery and the  morning of Surgery.  2.  If you choose to wash your hair, wash your hair first as usual with your  normal  shampoo.  3.  After you shampoo, rinse your hair and body thoroughly to remove the  shampoo.                           4.  Use CHG as you would any other liquid soap.  You can apply chg directly  to the skin and wash                       Gently with a scrungie or clean washcloth.  5.  Apply the CHG Soap to your body ONLY FROM THE NECK DOWN.   Do not use on face/ open  Wound or open sores. Avoid contact with eyes, ears mouth and genitals (private parts).                       Wash face,  Genitals (private parts) with your normal soap.             6.  Wash thoroughly, paying special attention to the area where your surgery  will be performed.  7.  Thoroughly rinse your body with warm water from the neck down.  8.  DO NOT shower/wash with your normal soap after using and rinsing off  the CHG Soap.                9.  Pat yourself dry with a clean towel.            10.  Wear clean pajamas.            11.  Place clean sheets on your bed the night of your first shower and do not  sleep with pets. Day of Surgery : Do not apply any lotions/deodorants the morning of surgery.  Please wear clean clothes to the hospital/surgery center.  FAILURE TO FOLLOW THESE INSTRUCTIONS MAY RESULT IN THE CANCELLATION OF YOUR SURGERY PATIENT SIGNATURE_________________________________  NURSE SIGNATURE_________________________________  ________________________________________________________________________

## 2018-07-16 NOTE — Progress Notes (Signed)
lov cardiology Dr MArk [place]ch 01-30-18 epic   ECHO 02-02-18 epic   EKG 02-03-18 epic   CXR 02-03-18 epic

## 2018-07-20 ENCOUNTER — Encounter (HOSPITAL_COMMUNITY): Payer: Self-pay

## 2018-07-20 ENCOUNTER — Encounter (HOSPITAL_COMMUNITY)
Admission: RE | Admit: 2018-07-20 | Discharge: 2018-07-20 | Disposition: A | Discharge status: Home / Self Care | Payer: Medicare Other | Source: Ambulatory Visit | Attending: Surgery | Admitting: Surgery

## 2018-07-20 ENCOUNTER — Other Ambulatory Visit: Payer: Self-pay

## 2018-07-20 DIAGNOSIS — Z01812 Encounter for preprocedural laboratory examination: Secondary | ICD-10-CM | POA: Insufficient documentation

## 2018-07-20 LAB — BASIC METABOLIC PANEL
ANION GAP: 9 (ref 5–15)
BUN: 14 mg/dL (ref 6–20)
CHLORIDE: 105 mmol/L (ref 98–111)
CO2: 26 mmol/L (ref 22–32)
Calcium: 9.8 mg/dL (ref 8.9–10.3)
Creatinine, Ser: 0.66 mg/dL (ref 0.44–1.00)
GFR calc non Af Amer: >60 mL/min (ref >60)
GLUCOSE: 104 mg/dL — AB (ref 70–99)
Potassium: 4.7 mmol/L (ref 3.5–5.1)
Sodium: 140 mmol/L (ref 135–145)

## 2018-07-20 LAB — CBC
HCT: 40.4 % (ref 36.0–46.0)
Hemoglobin: 12.2 g/dL (ref 12.0–15.0)
MCH: 26.6 pg (ref 26.0–34.0)
MCHC: 30.2 g/dL (ref 30.0–36.0)
MCV: 88 fL (ref 80.0–100.0)
Platelets: 318 10*3/uL (ref 150–400)
RBC: 4.59 MIL/uL (ref 3.87–5.11)
RDW: 14.2 % (ref 11.5–15.5)
WBC: 8.2 10*3/uL (ref 4.0–10.5)
nRBC: 0 % (ref 0.0–0.2)

## 2018-07-20 NOTE — Progress Notes (Signed)
[place]   [phone] [phone]   Clinic Follow up Note   Patient Care Team: [name], MD as PCP - General (Family Medicine) Jerline Pain, MD as PCP - Cardiology (Cardiology) Michael Boston, MD as Consulting Physician (General Surgery) Truitt Merle, MD as Consulting Physician (Medical Oncology) Kyung Rudd, MD as Consulting Physician (Radiation Oncology) Teena Irani, MD (Inactive) as Consulting Physician (Gastroenterology) Raynelle Bring, MD as Consulting Physician (Urology) 07/21/2018  CHIEF COMPLAINT: F/u on rectal cancer   SUMMARY OF ONCOLOGIC HISTORY: Oncology History   Rectal adenocarcinoma   Staging form: Colon and Rectum, AJCC 7th Edition     Clinical: T3, N2, M0 - Unsigned       Rectal adenocarcinoma s/p LAR resection 05/25/2015   01/13/2015 Initial Diagnosis    Rectal adenocarcinoma    01/13/2015 Procedure    Colonoscopy showed a sensitivity O nonobstructing mass in the rectum and from 12-18 cm proximal to the Annis. The mass was circumferential, measuring about 6 cm in length. EGD was negative.    01/13/2015 Cancer Staging    Staging form: Colon and Rectum, AJCC 7th Edition - Clinical stage from 01/13/2015: T3, N2, M0 - Signed by Truitt Merle, MD on 12/15/2017    01/17/2015 Tumor Marker    CEA 1.4, CA-19-9 8, MMR normal     01/18/2015 Procedure    Lower endoscopic ultrasound by Dr. Paulita Fujita showed a T3 N2 rectal mass.    01/20/2015 Imaging    CT abdomen and pelvis with contrast showed right lateral rectal wall exophytic mass, and 2 ill-defined hepatic hypoenhancing lesions which appears to correspond to the lesion seen on the prior MRI in 2015.    02/05/2015 Imaging    abdomen MRI showed 2 hemangioma, no suspicion for metastatic disease. CT chest was negative    02/20/2015 - 03/30/2015 Radiation Therapy    neoadjuvant RT to rectal cancer     02/20/2015 Concurrent Chemotherapy    capecitabine 2500 mg in the morning and 2000 mg in the  evening (840m/m2, bid), on the day of radiation.     05/25/2015 Surgery    Recto-sigmoid segmental resection, margins are negative     05/25/2015 Pathology Results    0.2cm residual invasive adenocarcinoma, G2, negative margins, 12 nodes were negative     05/25/2015 Cancer Staging    Staging form: Colon and Rectum, AJCC 7th Edition - Pathologic stage from 05/25/2015: Stage I (T1, N0, cM0) - Signed by FTruitt Merle MD on 12/15/2017    06/23/2015 - 06/29/2015 Hospital Admission    Patient was admitted for pelvic abscess, drain placed and she was treated with IV antibiotics, she also received 1 dose of Feraheme for anemia.    09/15/2015 - 09/17/2015 Hospital Admission    Recurrent pelvic collection s/p perc drainage 09/16/2015    02/21/2016 Imaging    CT abdomen and pelvis w contrast IMPRESSION: 1. Interval removal of surgical drains since 12/19/2015 from the presacral space. Similar size of ill-defined presacral fluid and gas, for which residual abscess cannot be excluded. Similar amount of intraperitoneal edema throughout the upper pelvis with foci of extraluminal gas, possibly related to the presacral process. 2. Diverting right-sided ileostomy, without acute complication. 3. Similar moderate hydroureteronephrosis, likely due to the pelvic process. 4. Possible bladder wall thickening. Correlate with urinalysis. This appearance could be partially due to underdistention. 5. Geographic hepatic steatosis and hemangiomas. Cannot exclude a new right hepatic lobe lesion. Consider nonemergent pre and post contrast outpatient MRI. This  could represent a new area of focal steatosis.    05/31/2016 Surgery    ileostomy takedown BY Dr. Johney Maine     07/04/2016 Imaging    CT Abdomen Pelvis W Contrast 07/04/16 IMPRESSION: Subcutaneous fluid collection at the site of ileostomy takedown, could be sterile or infected, measuring 9.3 x 4.3 x 5.3 cm; this could be aspirated under ultrasound guidance if  clinically indicated. Large collection of stool within the presacral space, could potentially be within a distended distorted rectum but since this occupies the same position as the abscess collection identified on the previous CT this is suspicious for a contiguous contained extraluminal stool collection communicating with the rectosigmoid colon, collection overall roughly 7 x 8 x 9 cm.    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    12/26/2016 Imaging    IMPRESSION: 1. Stable CT exam of the chest.  No evidence for metastatic disease. 2. Interval left colectomy with left abdominal end colostomy. Presacral soft tissue is similar to prior study when taking into account the interval surgery. 3. Hepatic dome lesion seen on the previous study not evident today, potentially related to bolus timing. The 2.4 cm subcapsular inferior right liver lesion remains subtle and is barely visible on today's exam but shows no substantial interval change in size. Continued attention on follow-up recommended.     12/09/2017 Imaging    CT CAP W Contrast 12/09/17  IMPRESSION: 1. Stable post treatment changes in the presacral space with no evidence of recurrent disease in the pelvis. 2. No evidence of metastatic disease in the chest, abdomen or pelvis. 3. Large parastomal hernia has increased in size and contains much of the remnant left colon. No evidence of bowel obstruction or ischemia. 4. Stable dilated main pulmonary artery, suggesting chronic pulmonary arterial hypertension. 5.  Aortic Atherosclerosis (ICD10-I70.0).    12/31/2017 Genetic Testing    The Multi-Cancer Panel offered by [facility] includes sequencing and/or deletion duplication testing of the following 83 genes: ALK, APC, ATM, AXIN2,BAP1,  BARD1, BLM, BMPR1A, BRCA1, BRCA2, BRIP1, CASR, CDC73, CDH1, CDK4, CDKN1B, CDKN1C, CDKN2A (p14ARF), CDKN2A (p16INK4a), CEBPA,  CHEK2, CTNNA1, DICER1, DIS3L2, EGFR (c.2369C>T, p.Thr790Met variant only), EPCAM (Deletion/duplication testing only), FH, FLCN, GATA2, GPC3, GREM1 (Promoter region deletion/duplication testing only), HOXB13 (c.251G>A, p.Gly84Glu), HRAS, KIT, MAX, MEN1, MET, MITF (c.952G>A, p.Glu318Lys variant only), MLH1, MSH2, MSH3, MSH6, MUTYH, NBN, NF1, NF2, NTHL1, PALB2, PDGFRA, PHOX2B, PMS2, POLD1, POLE, POT1, PRKAR1A, PTCH1, PTEN, RAD50, RAD51C, RAD51D, RB1, RECQL4, RET, RUNX1, SDHAF2, SDHA (sequence changes only), SDHB, SDHC, SDHD, SMAD4, SMARCA4, SMARCB1, SMARCE1, STK11, SUFU, TERC, TERT, TMEM127, TP53, TSC1, TSC2, VHL, WRN and WT1.   Results: Negative, no pathogenic variants identified.  The date of this test report is 12/31/2017.      CURRENT THERAPY Surveillance   INTERVAL HISTORY: DECLAN ADAMSON is a 53 y.o. female who is here for follow-up. Today, she is here alone. She is doing well and recovered well from her back surgery. She will have ventral wall hernia repair in 2 days. She takes pain medications including meloxicam and norco. for her back. She denies abdominal pain. She has lower limb neuropathy and prefers not to wear tight shoes or socks.   Pertinent positives and negatives of review of systems are listed and detailed within the above HPI.  REVIEW OF SYSTEMS:   Constitutional: Denies fevers, chills or abnormal weight loss Eyes: Denies blurriness of vision Ears, nose, mouth, throat, and  face: Denies mucositis or sore throat Respiratory: Denies cough, dyspnea or wheezes Cardiovascular: Denies palpitation, chest discomfort or lower extremity swelling Gastrointestinal:  Denies nausea, heartburn or change in bowel habits Skin: Denies abnormal skin rashes Lymphatics: Denies new lymphadenopathy or easy bruising Neurological:Denies numbness, tingling or new weaknesses Behavioral/Psych: Mood is stable, no new changes  MSK: (+) back pain All other systems were reviewed with the patient and are  negative.  MEDICAL HISTORY:  Past Medical History:  Diagnosis Date  . Anxiety   . Chemotherapy-induced neuropathy (HCC)    toes and fingers numbness and tingling  . Colostomy in place [facility]) 07/12/2016   due to anastomosis breakdown w/ colovaginal fistula  . Complication of anesthesia   . Depression   . Family history of adverse reaction to anesthesia    mother-- ponv  . Family history of breast cancer   . Family history of pancreatic cancer   . Family history of skin cancer   . Gastroenteritis 12/20/2015  . GERD (gastroesophageal reflux disease)   . Hiatal hernia   . History of cancer chemotherapy 02-20-2015 to 03-30-2015  . History of cardiac murmur as a child   . History of chemotherapy   . History of chronic gastritis   . History of hypertension    no issues since multiple abdminal sx's and chemo--- no medication since 12/ 2016  . History of TMJ disorder   . Hypercholesteremia   . Hypertension   . IBS (irritable bowel syndrome)    dx age 89  . Intermittent abdominal pain    post-op multiple abdominal sx's   . Microcytic anemia   . Mild sleep apnea    per pt study 2014  very mild osa , no cpap recommended, recommeded wt loss and sleep routine  . OA (osteoarthritis)    left knee /  left shoulder  . Obesity   . PONV (postoperative nausea and vomiting)    severe" needs Scopolamine PATCH"   . Portacath in place    right chest  . Rectal adenocarcinoma ([place]) oncologis-  dr Burr Medico-- after radiation/ chemo (ypT1, N0) --  no recurrence per last note 03/ 2018   dx 01-13-2015-- Stage IIIC (T3, N2, M0) post concurrent radiation and chemotherapy 02-20-2015 to 03-30-2015 /  05-25-2015 s/p  LAR w/ RSO (post-op complicated by late abscess and contained anatomotic leak w/ help percutaneous drainage and antibiotics)   . Rotator cuff tear, left   . S/P radiation therapy 02/20/15-03/30/15   colon/rectal  . Vitamin D deficiency   . Wears glasses   . Wears glasses     SURGICAL  HISTORY: Past Surgical History:  Procedure Laterality Date  . ABDOMINAL HYSTERECTOMY  1996   uterus and cervix  . BACK SURGERY  02/12/2018   lumbar surgery  . COLON RESECTION N/A 07/12/2016   Procedure: LAPAROSCOPIC LYSIS OF ADHESIONS, OMENTOPEXY, HAND ASSISTED RESECTION OF  COLON, END TO END ANASTOMOSIS, COLOSTOMY;  Surgeon: Michael Boston, MD;  Location: [facility];  Service: General;  Laterality: N/A;  . COMBINED HYSTEROSCOPY DIAGNOSTIC / D&C  x2 1990's  . DIAGNOSTIC LAPAROSCOPY  age 42 and age 73  . EUS N/A 01/18/2015   Procedure: LOWER ENDOSCOPIC ULTRASOUND (EUS);  Surgeon: Arta Silence, MD;  Location: [facility];  Service: Endoscopy;  Laterality: N/A;  . EXCISION OF SKIN TAG  11/17/2015   Procedure: EXCISION OF SKIN TAG;  Surgeon: Michael Boston, MD;  Location: [facility];  Service: General;;  . ILEO LOOP COLOSTOMY CLOSURE N/A 11/17/2015  Procedure: LAPAROSCOPIC DIVERTING LOOP ILEOSTOMY  DRAINAGE OF PELVIC ABSCESS;  Surgeon: Michael Boston, MD;  Location: [facility];  Service: General;  Laterality: N/A;  . ILEOSTOMY CLOSURE N/A 05/31/2016   Procedure: TAKEDOWN LOOP ILEOSTOMY;  Surgeon: Michael Boston, MD;  Location: [facility];  Service: General;  Laterality: N/A;  . IMPACTION REMOVAL  11/17/2015   Procedure: DISIMPACTION REMOVAL;  Surgeon: Michael Boston, MD;  Location: [facility];  Service: General;;  . KNEE ARTHROSCOPY Left 1990's  . KNEE ARTHROSCOPY W/ MENISCECTOMY Left 09/14/2009   and chondroplasty debridement  . LAPAROSCOPIC LYSIS OF ADHESIONS  11/17/2015   Procedure: LAPAROSCOPIC LYSIS OF ADHESIONS;  Surgeon: Michael Boston, MD;  Location: [facility];  Service: General;;  . LUMBAR WOUND DEBRIDEMENT N/A 04/24/2018   Procedure: wound exploration, irrigation and debridement;  Surgeon: Eustace Moore, MD;  Location: [place];  Service: Neurosurgery;  Laterality: N/A;  wound exploration, irrigation and debridement  . PORT-A-CATH REMOVAL N/A 11/21/2016   Procedure: REMOVAL PORT-A-CATH;  Surgeon: Michael Boston, MD;   Location[facility];  Service: General;  Laterality: N/A;  . PORTACATH PLACEMENT N/A 05/25/2015   Procedure: INSERTION PORT-A-CATH;  Surgeon: Michael Boston, MD;  Location: [facility];  Service: General;  Laterality: N/A;-remains inplace Right chest.  . ROTATOR CUFF REPAIR Right 2006  . TONSILLECTOMY  age 21  . XI ROBOTIC ASSISTED LOWER ANTERIOR RESECTION N/A 05/25/2015   Procedure: XI ROBOTIC ASSISTED LOWER ANTERIOR RESECTION, , RIGID PROCTOSCOPY, RIGHT OOPHORECTOMY;  Surgeon: Michael Boston, MD;  Location: [facility];  Service: General;  Laterality: N/A;    I have reviewed the social history and family history with the patient and they are unchanged from previous note.  ALLERGIES:  is allergic to caine-1 [lidocaine]; sulfa antibiotics; adhesive [tape]; doxycycline; flagyl [metronidazole]; iron; oxycodone; and penicillins.  MEDICATIONS:  Current Outpatient Medications  Medication Sig Dispense Refill  . Cholecalciferol (VITAMIN D3) 5000 units CAPS Take 5,000 Units by mouth at bedtime.     . Coenzyme Q10 (COQ10) 200 MG CAPS Take 200 mg by mouth at bedtime.    . DULoxetine (CYMBALTA) 60 MG capsule Take 120 mg by mouth at bedtime.     Marland Kitchen esomeprazole (NEXIUM) 40 MG capsule Take 1 capsule (40 mg total) by mouth 2 (two) times daily before a meal. 60 capsule 5  . gabapentin (NEURONTIN) 300 MG capsule Take 300-600 mg by mouth See admin instructions. Take 600 mg in the morning, 300 mg afternoon and 600 mg at night    . HYDROcodone-acetaminophen (NORCO/VICODIN) 5-325 MG tablet Take 1-2 tablets by mouth every 6 (six) hours as needed for moderate pain. 50 tablet 0  . Melatonin 3 MG TABS Take 3-6 mg by mouth at bedtime.     . meloxicam (MOBIC) 15 MG tablet Take 15 mg by mouth at bedtime.     . Multiple Vitamin (MULTIVITAMIN WITH MINERALS) TABS tablet Take 2 tablets by mouth at bedtime.     . nebivolol (BYSTOLIC) 5 MG tablet Take 5 mg by mouth at bedtime.     . polyethylene glycol (MIRALAX /  GLYCOLAX) packet Take 17 g by mouth daily as needed for moderate constipation.    . rosuvastatin (CRESTOR) 5 MG tablet Take 5 mg by mouth at bedtime.    Marland Kitchen tiZANidine (ZANAFLEX) 4 MG tablet Take 4 mg by mouth 3 (three) times daily as needed for muscle spasms.   2  . traMADol (ULTRAM) 50 MG tablet Take 50 mg by mouth every 6 (six) hours as  needed for moderate pain.     Marland Kitchen triamcinolone lotion (KENALOG) 0.1 % Apply 1 application topically as needed (dry skin).    . furosemide (LASIX) 20 MG tablet Take 20 mg by mouth daily as needed for edema.      No current facility-administered medications for this visit.    Facility-Administered Medications Ordered in Other Visits  Medication Dose Route Frequency Provider Last Rate Last Dose  . 0.9 %  sodium chloride infusion   Intravenous Once Truitt Merle, MD        PHYSICAL EXAMINATION: ECOG PERFORMANCE STATUS: 1 - Symptomatic but completely ambulatory  Vitals:   07/21/18 1410  BP: (!) 151/81  Pulse: 69  Resp: 17  Temp: 98.5 F (36.9 C)  SpO2: 100%   Filed Weights   07/21/18 1410  Weight: (!) 340 lb 14.4 oz (154.6 kg)    GENERAL:alert, no distress and comfortable SKIN: skin color, texture, turgor are normal, no rashes or significant lesions EYES: normal, Conjunctiva are pink and non-injected, sclera clear OROPHARYNX:no exudate, no erythema and lips, buccal mucosa, and tongue normal  NECK: supple, thyroid normal size, non-tender, without nodularity LYMPH:  no palpable lymphadenopathy in the cervical, axillary or inguinal LUNGS: clear to auscultation and percussion with normal breathing effort HEART: regular rate & rhythm and no murmurs and no lower extremity edema ABDOMEN:abdomen soft, non-tender and normal bowel sounds (+) colostomy bag (+) anterior abdominal wall hernia at colostomy site  Musculoskeletal:no cyanosis of digits and no clubbing (+0 back pain  NEURO: alert & oriented x 3 with fluent speech, no focal motor/sensory  deficits  LABORATORY DATA:  I have reviewed the data as listed CBC Latest Ref Rng & Units 07/21/2018 07/20/2018 04/23/2018  WBC 4.0 - 10.5 K/uL 7.7 8.2 8.1  Hemoglobin 12.0 - 15.0 g/dL 11.4(L) 12.2 12.6  Hematocrit 36.0 - 46.0 % 36.6 40.4 41.5  Platelets 150 - 400 K/uL 302 318 322     CMP Latest Ref Rng & Units 07/21/2018 07/20/2018 04/23/2018  Glucose 70 - 99 mg/dL 104(H) 104(H) 95  BUN 6 - 20 mg/dL _0 Creatinine 0.44 - 1.00 mg/dL 0.72 0.66 0.64  Sodium 135 - 145 mmol/L 141 140 139  Potassium 3.5 - 5.1 mmol/L 4.2 4.7 4.5  Chloride 98 - 111 mmol/L 106 105 104  CO2 22 - 32 mmol/L _1 Calcium 8.9 - 10.3 mg/dL 9.9 9.8 10.0  Total Protein 6.5 - 8.1 g/dL 7.2 - -  Total Bilirubin 0.3 - 1.2 mg/dL 0.3 - -  Alkaline Phos 38 - 126 U/L 100 - -  AST 15 - 41 U/L 13(L) - -  ALT 0 - 44 U/L 18 - -      RADIOGRAPHIC STUDIES: I have personally reviewed the radiological images as listed and agreed with the findings in the report. No results found.   ASSESSMENT & PLAN:  Lindsay Little is a 53 y.o. female with history of  1. Rectal adenocarcinomaT3N2M0, Stage IIIC , ypT1N0 after neoadjuvant chemotherapy and radiation -Diagnosed in 2016. Treated with surgery with neoadjuvant chemo and radiation. She is currently on surveillance.  -She has had recurrent abdominal abscess after surgery, adjuvant chemotherapy was not able to given.  -Labs reviewed, CBC showed Hg 11.4. CMP is overall WNLs -She is clinically stable and doing well from a cancer stand point.  No clinical concern for recurrence. -Last CT scan in May 2019 showed no evidence of recurrence. -she is scheduled to have a laparoscopic ventral  wall hernia repair, lysis of adhesion, by Dr. Johney Maine in 2 days -f/u in 6 months.  2. Bilateral Leg muscle cramps and neuropathy in feet -Currently on Gabapentin and Cymbalta with Meloxicam and Tramadol. -I previously suggested she uses calcium, magnesium, and acupuncture. -She will f/u with  PCP, orthopedics and neurologist.  3. HTN -She'll continue follow-up with her primary care physician   Plan  -f/u in 6 months with labs   No problem-specific Assessment & Plan notes found for this encounter.   No orders of the defined types were placed in this encounter.  All questions were answered. The patient knows to call the clinic with any problems, questions or concerns. No barriers to learning was detected. I spent 15 minutes counseling the patient face to face. The total time spent in the appointment was 20 minutes and more than 50% was on counseling and review of test results  I, Noor Dweik am acting as scribe for Dr. Truitt Merle.  I have reviewed the above documentation for accuracy and completeness, and I agree with the above.     Truitt Merle, MD 07/21/2018

## 2018-07-21 ENCOUNTER — Inpatient Hospital Stay: Payer: Medicare Other

## 2018-07-21 ENCOUNTER — Inpatient Hospital Stay: Payer: Medicare Other | Attending: Hematology | Admitting: Hematology

## 2018-07-21 ENCOUNTER — Encounter: Payer: Self-pay | Admitting: Hematology

## 2018-07-21 VITALS — BP 151/81 | HR 69 | Temp 98.5°F | Resp 17 | Ht 70.5 in | Wt 340.9 lb

## 2018-07-21 DIAGNOSIS — Z803 Family history of malignant neoplasm of breast: Secondary | ICD-10-CM

## 2018-07-21 DIAGNOSIS — G629 Polyneuropathy, unspecified: Secondary | ICD-10-CM | POA: Diagnosis not present

## 2018-07-21 DIAGNOSIS — I10 Essential (primary) hypertension: Secondary | ICD-10-CM | POA: Diagnosis not present

## 2018-07-21 DIAGNOSIS — Z85048 Personal history of other malignant neoplasm of rectum, rectosigmoid junction, and anus: Secondary | ICD-10-CM | POA: Diagnosis not present

## 2018-07-21 DIAGNOSIS — C20 Malignant neoplasm of rectum: Secondary | ICD-10-CM

## 2018-07-21 DIAGNOSIS — Z800 Family history of malignant neoplasm of digestive organs: Secondary | ICD-10-CM

## 2018-07-21 DIAGNOSIS — R252 Cramp and spasm: Secondary | ICD-10-CM

## 2018-07-21 DIAGNOSIS — Z808 Family history of malignant neoplasm of other organs or systems: Secondary | ICD-10-CM

## 2018-07-21 LAB — CBC WITH DIFFERENTIAL/PLATELET
Abs Immature Granulocytes: 0.04 10*3/uL (ref 0.00–0.07)
Basophils Absolute: 0 10*3/uL (ref 0.0–0.1)
Basophils Relative: 1 %
EOS PCT: 2 %
Eosinophils Absolute: 0.1 10*3/uL (ref 0.0–0.5)
HEMATOCRIT: 36.6 % (ref 36.0–46.0)
HEMOGLOBIN: 11.4 g/dL — AB (ref 12.0–15.0)
Immature Granulocytes: 1 %
LYMPHS PCT: 19 %
Lymphs Abs: 1.4 10*3/uL (ref 0.7–4.0)
MCH: 26.9 pg (ref 26.0–34.0)
MCHC: 31.1 g/dL (ref 30.0–36.0)
MCV: 86.3 fL (ref 80.0–100.0)
Monocytes Absolute: 0.5 10*3/uL (ref 0.1–1.0)
Monocytes Relative: 7 %
Neutro Abs: 5.5 10*3/uL (ref 1.7–7.7)
Neutrophils Relative %: 70 %
Platelets: 302 10*3/uL (ref 150–400)
RBC: 4.24 MIL/uL (ref 3.87–5.11)
RDW: 14.4 % (ref 11.5–15.5)
WBC: 7.7 10*3/uL (ref 4.0–10.5)
nRBC: 0 % (ref 0.0–0.2)

## 2018-07-21 LAB — CMP (CANCER CENTER ONLY)
ALK PHOS: 100 U/L (ref 38–126)
ALT: 18 U/L (ref 0–44)
ANION GAP: 8 (ref 5–15)
AST: 13 U/L — ABNORMAL LOW (ref 15–41)
Albumin: 3.7 g/dL (ref 3.5–5.0)
BILIRUBIN TOTAL: 0.3 mg/dL (ref 0.3–1.2)
BUN: 11 mg/dL (ref 6–20)
CALCIUM: 9.9 mg/dL (ref 8.9–10.3)
CO2: 27 mmol/L (ref 22–32)
Chloride: 106 mmol/L (ref 98–111)
Creatinine: 0.72 mg/dL (ref 0.44–1.00)
GFR, Estimated: >60 mL/min (ref >60)
Glucose, Bld: 104 mg/dL — ABNORMAL HIGH (ref 70–99)
Potassium: 4.2 mmol/L (ref 3.5–5.1)
SODIUM: 141 mmol/L (ref 135–145)
TOTAL PROTEIN: 7.2 g/dL (ref 6.5–8.1)

## 2018-07-21 LAB — CEA (IN HOUSE-CHCC): CEA (CHCC-In House): 2.04 ng/mL (ref 0.00–5.00)

## 2018-07-22 ENCOUNTER — Telehealth: Payer: Self-pay | Admitting: Hematology

## 2018-07-22 NOTE — Telephone Encounter (Signed)
Spoke with patient about scheduled appointments for July.  Per patient request, printed and mailed calendar.

## 2018-07-23 ENCOUNTER — Ambulatory Visit (HOSPITAL_COMMUNITY): Payer: Medicare Other | Admitting: Certified Registered Nurse Anesthetist

## 2018-07-23 ENCOUNTER — Other Ambulatory Visit: Payer: Self-pay

## 2018-07-23 ENCOUNTER — Encounter (HOSPITAL_COMMUNITY): Payer: Self-pay | Admitting: Emergency Medicine

## 2018-07-23 ENCOUNTER — Inpatient Hospital Stay (HOSPITAL_COMMUNITY)
Admission: AD | Admit: 2018-07-23 | Discharge: 2018-07-26 | DRG: 354 | Disposition: A | Discharge status: Home / Self Care | Payer: Medicare Other | Attending: Surgery | Admitting: Surgery

## 2018-07-23 ENCOUNTER — Ambulatory Visit (HOSPITAL_COMMUNITY): Payer: Medicare Other | Admitting: Physician Assistant

## 2018-07-23 ENCOUNTER — Encounter (HOSPITAL_COMMUNITY)
Admission: AD | Disposition: A | Discharge status: Home / Self Care | Payer: Self-pay | Source: Home / Self Care | Attending: Surgery

## 2018-07-23 DIAGNOSIS — K219 Gastro-esophageal reflux disease without esophagitis: Secondary | ICD-10-CM | POA: Diagnosis present

## 2018-07-23 DIAGNOSIS — M1712 Unilateral primary osteoarthritis, left knee: Secondary | ICD-10-CM | POA: Diagnosis present

## 2018-07-23 DIAGNOSIS — N823 Fistula of vagina to large intestine: Secondary | ICD-10-CM | POA: Diagnosis present

## 2018-07-23 DIAGNOSIS — I10 Essential (primary) hypertension: Secondary | ICD-10-CM | POA: Diagnosis present

## 2018-07-23 DIAGNOSIS — K659 Peritonitis, unspecified: Secondary | ICD-10-CM

## 2018-07-23 DIAGNOSIS — K433 Parastomal hernia with obstruction, without gangrene: Secondary | ICD-10-CM | POA: Diagnosis present

## 2018-07-23 DIAGNOSIS — F419 Anxiety disorder, unspecified: Secondary | ICD-10-CM | POA: Diagnosis present

## 2018-07-23 DIAGNOSIS — Z79899 Other long term (current) drug therapy: Secondary | ICD-10-CM | POA: Diagnosis not present

## 2018-07-23 DIAGNOSIS — F329 Major depressive disorder, single episode, unspecified: Secondary | ICD-10-CM | POA: Diagnosis present

## 2018-07-23 DIAGNOSIS — Z791 Long term (current) use of non-steroidal anti-inflammatories (NSAID): Secondary | ICD-10-CM | POA: Diagnosis not present

## 2018-07-23 DIAGNOSIS — Z933 Colostomy status: Secondary | ICD-10-CM | POA: Diagnosis not present

## 2018-07-23 DIAGNOSIS — K5090 Crohn's disease, unspecified, without complications: Secondary | ICD-10-CM | POA: Diagnosis present

## 2018-07-23 DIAGNOSIS — Z87891 Personal history of nicotine dependence: Secondary | ICD-10-CM

## 2018-07-23 DIAGNOSIS — Z85048 Personal history of other malignant neoplasm of rectum, rectosigmoid junction, and anus: Secondary | ICD-10-CM

## 2018-07-23 DIAGNOSIS — Z923 Personal history of irradiation: Secondary | ICD-10-CM

## 2018-07-23 DIAGNOSIS — Z6841 Body Mass Index (BMI) 40.0 and over, adult: Secondary | ICD-10-CM

## 2018-07-23 DIAGNOSIS — K435 Parastomal hernia without obstruction or  gangrene: Secondary | ICD-10-CM | POA: Diagnosis present

## 2018-07-23 DIAGNOSIS — Z882 Allergy status to sulfonamides status: Secondary | ICD-10-CM | POA: Diagnosis not present

## 2018-07-23 DIAGNOSIS — Z885 Allergy status to narcotic agent status: Secondary | ICD-10-CM | POA: Diagnosis not present

## 2018-07-23 DIAGNOSIS — K589 Irritable bowel syndrome without diarrhea: Secondary | ICD-10-CM | POA: Diagnosis present

## 2018-07-23 DIAGNOSIS — Z9221 Personal history of antineoplastic chemotherapy: Secondary | ICD-10-CM

## 2018-07-23 DIAGNOSIS — Z23 Encounter for immunization: Secondary | ICD-10-CM

## 2018-07-23 DIAGNOSIS — Z881 Allergy status to other antibiotic agents status: Secondary | ICD-10-CM

## 2018-07-23 DIAGNOSIS — C20 Malignant neoplasm of rectum: Secondary | ICD-10-CM | POA: Diagnosis present

## 2018-07-23 DIAGNOSIS — K669 Disorder of peritoneum, unspecified: Secondary | ICD-10-CM

## 2018-07-23 DIAGNOSIS — E66813 Obesity, class 3: Secondary | ICD-10-CM | POA: Diagnosis present

## 2018-07-23 DIAGNOSIS — Z888 Allergy status to other drugs, medicaments and biological substances status: Secondary | ICD-10-CM | POA: Diagnosis not present

## 2018-07-23 DIAGNOSIS — N828 Other female genital tract fistulae: Secondary | ICD-10-CM | POA: Diagnosis present

## 2018-07-23 DIAGNOSIS — E7800 Pure hypercholesterolemia, unspecified: Secondary | ICD-10-CM | POA: Diagnosis present

## 2018-07-23 DIAGNOSIS — G894 Chronic pain syndrome: Secondary | ICD-10-CM

## 2018-07-23 DIAGNOSIS — K604 Rectal fistula, unspecified: Secondary | ICD-10-CM | POA: Diagnosis present

## 2018-07-23 DIAGNOSIS — F32A Depression, unspecified: Secondary | ICD-10-CM | POA: Diagnosis present

## 2018-07-23 DIAGNOSIS — M19012 Primary osteoarthritis, left shoulder: Secondary | ICD-10-CM | POA: Diagnosis present

## 2018-07-23 HISTORY — PX: INSERTION OF MESH: SHX5868

## 2018-07-23 HISTORY — DX: Parastomal hernia without obstruction or gangrene: K43.5

## 2018-07-23 HISTORY — DX: Other female intestinal-genital tract fistulae: N82.4

## 2018-07-23 HISTORY — PX: LYSIS OF ADHESION: SHX5961

## 2018-07-23 HISTORY — PX: VENTRAL HERNIA REPAIR: SHX424

## 2018-07-23 SURGERY — REPAIR, HERNIA, VENTRAL, LAPAROSCOPIC
Anesthesia: General | Site: Abdomen

## 2018-07-23 MED ORDER — GENTAMICIN SULFATE 40 MG/ML IJ SOLN
540.0000 mg | INTRAVENOUS | Status: AC
  Administered 2018-07-23: 540 mg via INTRAVENOUS
  Filled 2018-07-23: qty 13.5

## 2018-07-23 MED ORDER — GUAIFENESIN-DM 100-10 MG/5ML PO SYRP: 10.0000 mL | ORAL_SOLUTION | ORAL | Status: DC | PRN

## 2018-07-23 MED ORDER — METOCLOPRAMIDE HCL 5 MG/ML IJ SOLN
5.0000 mg | Freq: Three times a day (TID) | INTRAMUSCULAR | Status: DC | PRN
  Administered 2018-07-24: 10 mg via INTRAVENOUS
  Filled 2018-07-23: qty 2

## 2018-07-23 MED ORDER — FENTANYL CITRATE (PF) 100 MCG/2ML IJ SOLN
INTRAMUSCULAR | Status: AC
  Filled 2018-07-23: qty 2

## 2018-07-23 MED ORDER — CLINDAMYCIN PHOSPHATE 600 MG/50ML IV SOLN: 600.0000 mg | INTRAVENOUS | Status: DC

## 2018-07-23 MED ORDER — LIDOCAINE 2% (20 MG/ML) 5 ML SYRINGE
INTRAMUSCULAR | Status: AC
  Filled 2018-07-23: qty 5

## 2018-07-23 MED ORDER — POLYETHYLENE GLYCOL 3350 17 G PO PACK: 17.0000 g | PACK | Freq: Every day | ORAL | Status: DC | PRN

## 2018-07-23 MED ORDER — NEBIVOLOL HCL 5 MG PO TABS
5.0000 mg | ORAL_TABLET | Freq: Every day | ORAL | Status: DC
  Administered 2018-07-23 – 2018-07-25 (×3): 5 mg via ORAL
  Filled 2018-07-23 (×3): qty 1

## 2018-07-23 MED ORDER — HYDROCORTISONE 2.5 % RE CREA: 1.0000 "application " | TOPICAL_CREAM | Freq: Four times a day (QID) | RECTAL | Status: DC | PRN

## 2018-07-23 MED ORDER — DULOXETINE HCL 60 MG PO CPEP
120.0000 mg | ORAL_CAPSULE | Freq: Every day | ORAL | Status: DC
  Administered 2018-07-23 – 2018-07-25 (×3): 120 mg via ORAL
  Filled 2018-07-23 (×3): qty 2

## 2018-07-23 MED ORDER — DIPHENHYDRAMINE HCL 50 MG/ML IJ SOLN
INTRAMUSCULAR | Status: DC | PRN
  Administered 2018-07-23: 12.5 mg via INTRAVENOUS

## 2018-07-23 MED ORDER — HYDROCODONE-ACETAMINOPHEN 5-325 MG PO TABS
1.0000 | ORAL_TABLET | Freq: Four times a day (QID) | ORAL | Status: DC | PRN
  Administered 2018-07-23 – 2018-07-24 (×2): 2 via ORAL
  Filled 2018-07-23 (×4): qty 2

## 2018-07-23 MED ORDER — HYDROCORTISONE 1 % EX CREA: 1.0000 "application " | TOPICAL_CREAM | Freq: Three times a day (TID) | CUTANEOUS | Status: DC | PRN

## 2018-07-23 MED ORDER — DIPHENHYDRAMINE HCL 12.5 MG/5ML PO ELIX: 12.5000 mg | ORAL_SOLUTION | Freq: Four times a day (QID) | ORAL | Status: DC | PRN

## 2018-07-23 MED ORDER — PROCHLORPERAZINE MALEATE 10 MG PO TABS: 10.0000 mg | ORAL_TABLET | Freq: Four times a day (QID) | ORAL | Status: DC | PRN

## 2018-07-23 MED ORDER — DIPHENHYDRAMINE HCL 50 MG/ML IJ SOLN
INTRAMUSCULAR | Status: AC
  Filled 2018-07-23: qty 1

## 2018-07-23 MED ORDER — CELECOXIB 200 MG PO CAPS
200.0000 mg | ORAL_CAPSULE | ORAL | Status: AC
  Administered 2018-07-23: 200 mg via ORAL

## 2018-07-23 MED ORDER — SIMETHICONE 80 MG PO CHEW: 40.0000 mg | CHEWABLE_TABLET | Freq: Four times a day (QID) | ORAL | Status: DC | PRN

## 2018-07-23 MED ORDER — ONDANSETRON HCL 4 MG/2ML IJ SOLN
INTRAMUSCULAR | Status: AC
  Filled 2018-07-23: qty 2

## 2018-07-23 MED ORDER — ALUM & MAG HYDROXIDE-SIMETH 200-200-20 MG/5ML PO SUSP: 30.0000 mL | Freq: Four times a day (QID) | ORAL | Status: DC | PRN

## 2018-07-23 MED ORDER — PROMETHAZINE HCL 25 MG/ML IJ SOLN: 6.2500 mg | INTRAMUSCULAR | Status: DC | PRN

## 2018-07-23 MED ORDER — DEXAMETHASONE SODIUM PHOSPHATE 4 MG/ML IJ SOLN
INTRAMUSCULAR | Status: DC | PRN
  Administered 2018-07-23: 10 mg via INTRAVENOUS

## 2018-07-23 MED ORDER — BUPIVACAINE LIPOSOME 1.3 % IJ SUSP
20.0000 mL | Freq: Once | INTRAMUSCULAR | Status: DC
  Filled 2018-07-23: qty 20

## 2018-07-23 MED ORDER — PANTOPRAZOLE SODIUM 40 MG PO TBEC
80.0000 mg | DELAYED_RELEASE_TABLET | Freq: Every day | ORAL | Status: DC
  Administered 2018-07-24 – 2018-07-26 (×3): 80 mg via ORAL
  Filled 2018-07-23 (×3): qty 2

## 2018-07-23 MED ORDER — MELATONIN 3 MG PO TABS
3.0000 mg | ORAL_TABLET | Freq: Every day | ORAL | Status: DC
  Filled 2018-07-23 (×3): qty 2

## 2018-07-23 MED ORDER — ONDANSETRON 4 MG PO TBDP: 4.0000 mg | ORAL_TABLET | Freq: Four times a day (QID) | ORAL | Status: DC | PRN

## 2018-07-23 MED ORDER — PROCHLORPERAZINE EDISYLATE 10 MG/2ML IJ SOLN: 5.0000 mg | Freq: Four times a day (QID) | INTRAMUSCULAR | Status: DC | PRN

## 2018-07-23 MED ORDER — LABETALOL HCL 5 MG/ML IV SOLN
INTRAVENOUS | Status: AC
  Filled 2018-07-23: qty 4

## 2018-07-23 MED ORDER — GABAPENTIN 300 MG PO CAPS: 300.0000 mg | ORAL_CAPSULE | Freq: Two times a day (BID) | ORAL | Status: DC

## 2018-07-23 MED ORDER — SUCCINYLCHOLINE CHLORIDE 200 MG/10ML IV SOSY
PREFILLED_SYRINGE | INTRAVENOUS | Status: AC
  Filled 2018-07-23: qty 10

## 2018-07-23 MED ORDER — FENTANYL CITRATE (PF) 100 MCG/2ML IJ SOLN
25.0000 ug | INTRAMUSCULAR | Status: DC | PRN
  Administered 2018-07-23 (×2): 50 ug via INTRAVENOUS

## 2018-07-23 MED ORDER — KETAMINE HCL 10 MG/ML IJ SOLN
INTRAMUSCULAR | Status: DC | PRN
  Administered 2018-07-23: 35 mg via INTRAVENOUS
  Administered 2018-07-23: 20 mg via INTRAVENOUS

## 2018-07-23 MED ORDER — LACTATED RINGERS IV SOLN: 1000.0000 mL | Freq: Three times a day (TID) | INTRAVENOUS | Status: AC | PRN

## 2018-07-23 MED ORDER — CELECOXIB 200 MG PO CAPS
ORAL_CAPSULE | ORAL | Status: AC
  Administered 2018-07-23: 200 mg via ORAL
  Filled 2018-07-23: qty 1

## 2018-07-23 MED ORDER — ADULT MULTIVITAMIN W/MINERALS CH
2.0000 | ORAL_TABLET | Freq: Every day | ORAL | Status: DC
  Administered 2018-07-23 – 2018-07-25 (×3): 2 via ORAL
  Filled 2018-07-23 (×3): qty 2

## 2018-07-23 MED ORDER — ZOLPIDEM TARTRATE 5 MG PO TABS: 5.0000 mg | ORAL_TABLET | Freq: Every evening | ORAL | Status: DC | PRN

## 2018-07-23 MED ORDER — LIP MEDEX EX OINT
1.0000 "application " | TOPICAL_OINTMENT | Freq: Two times a day (BID) | CUTANEOUS | Status: DC
  Administered 2018-07-23 – 2018-07-26 (×5): 1 via TOPICAL
  Filled 2018-07-23: qty 7

## 2018-07-23 MED ORDER — MENTHOL 3 MG MT LOZG: 1.0000 | LOZENGE | OROMUCOSAL | Status: DC | PRN

## 2018-07-23 MED ORDER — LACTATED RINGERS IV SOLN
INTRAVENOUS | Status: DC
  Administered 2018-07-23 (×3): via INTRAVENOUS

## 2018-07-23 MED ORDER — PROPOFOL 10 MG/ML IV BOLUS
INTRAVENOUS | Status: AC
  Filled 2018-07-23: qty 40

## 2018-07-23 MED ORDER — TIZANIDINE HCL 4 MG PO TABS
4.0000 mg | ORAL_TABLET | Freq: Three times a day (TID) | ORAL | Status: DC | PRN
  Administered 2018-07-24: 4 mg via ORAL
  Filled 2018-07-23: qty 1

## 2018-07-23 MED ORDER — ONDANSETRON HCL 4 MG/2ML IJ SOLN: 4.0000 mg | Freq: Four times a day (QID) | INTRAMUSCULAR | Status: DC | PRN

## 2018-07-23 MED ORDER — SUGAMMADEX SODIUM 200 MG/2ML IV SOLN
INTRAVENOUS | Status: DC | PRN
  Administered 2018-07-23: 500 mg via INTRAVENOUS

## 2018-07-23 MED ORDER — ROCURONIUM BROMIDE 10 MG/ML (PF) SYRINGE
PREFILLED_SYRINGE | INTRAVENOUS | Status: AC
  Filled 2018-07-23: qty 10

## 2018-07-23 MED ORDER — DIPHENHYDRAMINE HCL 50 MG/ML IJ SOLN: 12.5000 mg | Freq: Four times a day (QID) | INTRAMUSCULAR | Status: DC | PRN

## 2018-07-23 MED ORDER — KETAMINE HCL 10 MG/ML IJ SOLN
INTRAMUSCULAR | Status: AC
  Filled 2018-07-23: qty 1

## 2018-07-23 MED ORDER — FENTANYL CITRATE (PF) 100 MCG/2ML IJ SOLN
INTRAMUSCULAR | Status: DC | PRN
  Administered 2018-07-23 (×8): 50 ug via INTRAVENOUS
  Administered 2018-07-23: 100 ug via INTRAVENOUS
  Administered 2018-07-23 (×2): 50 ug via INTRAVENOUS

## 2018-07-23 MED ORDER — SODIUM CHLORIDE 0.9 % IV SOLN
INTRAVENOUS | Status: DC | PRN
  Administered 2018-07-23: 1000 mL via INTRAPERITONEAL

## 2018-07-23 MED ORDER — BUPIVACAINE-EPINEPHRINE (PF) 0.25% -1:200000 IJ SOLN
INTRAMUSCULAR | Status: AC
  Filled 2018-07-23: qty 90

## 2018-07-23 MED ORDER — DEXAMETHASONE SODIUM PHOSPHATE 10 MG/ML IJ SOLN
INTRAMUSCULAR | Status: AC
  Filled 2018-07-23: qty 1

## 2018-07-23 MED ORDER — ACETAMINOPHEN 500 MG PO TABS
1000.0000 mg | ORAL_TABLET | Freq: Once | ORAL | Status: DC
  Filled 2018-07-23: qty 2

## 2018-07-23 MED ORDER — SCOPOLAMINE 1 MG/3DAYS TD PT72
1.0000 | MEDICATED_PATCH | Freq: Once | TRANSDERMAL | Status: AC
  Administered 2018-07-23: 1 via TRANSDERMAL
  Filled 2018-07-23: qty 1

## 2018-07-23 MED ORDER — MAGIC MOUTHWASH
15.0000 mL | Freq: Four times a day (QID) | ORAL | Status: DC | PRN
  Filled 2018-07-23: qty 15

## 2018-07-23 MED ORDER — LACTATED RINGERS IV BOLUS: 1000.0000 mL | Freq: Three times a day (TID) | INTRAVENOUS | Status: DC | PRN

## 2018-07-23 MED ORDER — 0.9 % SODIUM CHLORIDE (POUR BTL) OPTIME
TOPICAL | Status: DC | PRN
  Administered 2018-07-23: 1000 mL

## 2018-07-23 MED ORDER — COQ10 200 MG PO CAPS: 200.0000 mg | ORAL_CAPSULE | Freq: Every day | ORAL | Status: DC

## 2018-07-23 MED ORDER — SODIUM CHLORIDE 0.9 % IV SOLN
INTRAVENOUS | Status: DC
  Administered 2018-07-23: 16:00:00 via INTRAVENOUS

## 2018-07-23 MED ORDER — MIDAZOLAM HCL 5 MG/5ML IJ SOLN
INTRAMUSCULAR | Status: DC | PRN
  Administered 2018-07-23: 2 mg via INTRAVENOUS

## 2018-07-23 MED ORDER — HYDROMORPHONE HCL 1 MG/ML IJ SOLN
0.5000 mg | INTRAMUSCULAR | Status: DC | PRN
  Administered 2018-07-23: 2 mg via INTRAVENOUS
  Administered 2018-07-23 – 2018-07-24 (×6): 1 mg via INTRAVENOUS
  Administered 2018-07-25: 2 mg via INTRAVENOUS
  Administered 2018-07-25 (×2): 1 mg via INTRAVENOUS
  Filled 2018-07-23 (×3): qty 1
  Filled 2018-07-23: qty 2
  Filled 2018-07-23 (×6): qty 1

## 2018-07-23 MED ORDER — SUCCINYLCHOLINE CHLORIDE 200 MG/10ML IV SOSY
PREFILLED_SYRINGE | INTRAVENOUS | Status: DC | PRN
  Administered 2018-07-23: 140 mg via INTRAVENOUS

## 2018-07-23 MED ORDER — PHENOL 1.4 % MT LIQD: 1.0000 | OROMUCOSAL | Status: DC | PRN

## 2018-07-23 MED ORDER — PROPOFOL 10 MG/ML IV BOLUS
INTRAVENOUS | Status: DC | PRN
  Administered 2018-07-23: 150 mg via INTRAVENOUS
  Administered 2018-07-23: 50 mg via INTRAVENOUS

## 2018-07-23 MED ORDER — LIDOCAINE HCL 2 % IJ SOLN
INTRAMUSCULAR | Status: AC
  Filled 2018-07-23: qty 20

## 2018-07-23 MED ORDER — VITAMIN D 25 MCG (1000 UNIT) PO TABS
5000.0000 [IU] | ORAL_TABLET | Freq: Every day | ORAL | Status: DC
  Administered 2018-07-23 – 2018-07-25 (×3): 5000 [IU] via ORAL
  Filled 2018-07-23 (×3): qty 5

## 2018-07-23 MED ORDER — BUPIVACAINE-EPINEPHRINE 0.25% -1:200000 IJ SOLN
INTRAMUSCULAR | Status: DC | PRN
  Administered 2018-07-23: 25 mL
  Administered 2018-07-23: 65 mL

## 2018-07-23 MED ORDER — ENOXAPARIN SODIUM 40 MG/0.4ML ~~LOC~~ SOLN
40.0000 mg | SUBCUTANEOUS | Status: DC
  Administered 2018-07-24 – 2018-07-26 (×3): 40 mg via SUBCUTANEOUS
  Filled 2018-07-23 (×3): qty 0.4

## 2018-07-23 MED ORDER — LABETALOL HCL 5 MG/ML IV SOLN
INTRAVENOUS | Status: DC | PRN
  Administered 2018-07-23 (×2): 5 mg via INTRAVENOUS

## 2018-07-23 MED ORDER — HYDRALAZINE HCL 20 MG/ML IJ SOLN: 5.0000 mg | INTRAMUSCULAR | Status: DC | PRN

## 2018-07-23 MED ORDER — CLINDAMYCIN PHOSPHATE 900 MG/50ML IV SOLN
900.0000 mg | INTRAVENOUS | Status: AC
  Administered 2018-07-23: 900 mg via INTRAVENOUS

## 2018-07-23 MED ORDER — GABAPENTIN 300 MG PO CAPS
600.0000 mg | ORAL_CAPSULE | Freq: Two times a day (BID) | ORAL | Status: DC
  Administered 2018-07-23 – 2018-07-24 (×3): 600 mg via ORAL
  Filled 2018-07-23 (×3): qty 2

## 2018-07-23 MED ORDER — FENTANYL CITRATE (PF) 250 MCG/5ML IJ SOLN
INTRAMUSCULAR | Status: AC
  Filled 2018-07-23: qty 5

## 2018-07-23 MED ORDER — MELOXICAM 15 MG PO TABS
15.0000 mg | ORAL_TABLET | Freq: Every day | ORAL | Status: DC
  Administered 2018-07-23 – 2018-07-25 (×3): 15 mg via ORAL
  Filled 2018-07-23 (×3): qty 1

## 2018-07-23 MED ORDER — SODIUM CHLORIDE 0.9 % IV SOLN
INTRAVENOUS | Status: AC
  Filled 2018-07-23: qty 6

## 2018-07-23 MED ORDER — METOPROLOL TARTRATE 5 MG/5ML IV SOLN: 5.0000 mg | Freq: Four times a day (QID) | INTRAVENOUS | Status: DC | PRN

## 2018-07-23 MED ORDER — GABAPENTIN 300 MG PO CAPS
300.0000 mg | ORAL_CAPSULE | Freq: Every day | ORAL | Status: DC
  Administered 2018-07-24: 300 mg via ORAL
  Filled 2018-07-23: qty 1

## 2018-07-23 MED ORDER — MIDAZOLAM HCL 2 MG/2ML IJ SOLN
INTRAMUSCULAR | Status: AC
  Filled 2018-07-23: qty 2

## 2018-07-23 MED ORDER — SUGAMMADEX SODIUM 500 MG/5ML IV SOLN
INTRAVENOUS | Status: AC
  Filled 2018-07-23: qty 5

## 2018-07-23 MED ORDER — LORAZEPAM 2 MG/ML IJ SOLN: 0.5000 mg | Freq: Three times a day (TID) | INTRAMUSCULAR | Status: DC | PRN

## 2018-07-23 MED ORDER — ROCURONIUM BROMIDE 10 MG/ML (PF) SYRINGE
PREFILLED_SYRINGE | INTRAVENOUS | Status: DC | PRN
  Administered 2018-07-23: 10 mg via INTRAVENOUS
  Administered 2018-07-23 (×3): 20 mg via INTRAVENOUS
  Administered 2018-07-23: 60 mg via INTRAVENOUS
  Administered 2018-07-23: 20 mg via INTRAVENOUS
  Administered 2018-07-23: 10 mg via INTRAVENOUS

## 2018-07-23 MED ORDER — ACETAMINOPHEN 500 MG PO TABS
1000.0000 mg | ORAL_TABLET | ORAL | Status: AC
  Administered 2018-07-23: 1000 mg via ORAL

## 2018-07-23 MED ORDER — ROSUVASTATIN CALCIUM 5 MG PO TABS
5.0000 mg | ORAL_TABLET | Freq: Every day | ORAL | Status: DC
  Administered 2018-07-23 – 2018-07-25 (×3): 5 mg via ORAL
  Filled 2018-07-23 (×3): qty 1

## 2018-07-23 MED ORDER — BUPIVACAINE LIPOSOME 1.3 % IJ SUSP
INTRAMUSCULAR | Status: DC | PRN
  Administered 2018-07-23: 15 mL
  Administered 2018-07-23: 5 mL

## 2018-07-23 MED ORDER — ONDANSETRON HCL 4 MG/2ML IJ SOLN
INTRAMUSCULAR | Status: DC | PRN
  Administered 2018-07-23 (×2): 4 mg via INTRAVENOUS

## 2018-07-23 MED ORDER — BISACODYL 10 MG RE SUPP: 10.0000 mg | Freq: Every day | RECTAL | Status: DC | PRN

## 2018-07-23 MED ORDER — ACETAMINOPHEN 325 MG PO TABS
325.0000 mg | ORAL_TABLET | Freq: Three times a day (TID) | ORAL | Status: DC
  Administered 2018-07-23 – 2018-07-25 (×4): 325 mg via ORAL
  Filled 2018-07-23 (×5): qty 1

## 2018-07-23 MED ORDER — INFLUENZA VAC SPLIT QUAD 0.5 ML IM SUSY
0.5000 mL | PREFILLED_SYRINGE | INTRAMUSCULAR | Status: AC
  Administered 2018-07-24: 0.5 mL via INTRAMUSCULAR

## 2018-07-23 MED ORDER — CLINDAMYCIN PHOSPHATE 900 MG/50ML IV SOLN
INTRAVENOUS | Status: AC
  Filled 2018-07-23: qty 50

## 2018-07-23 SURGICAL SUPPLY — 70 items
APL SRG 38 LTWT LNG FL B (MISCELLANEOUS) ×2
APPLICATOR ARISTA FLEXITIP XL (MISCELLANEOUS) ×2 IMPLANT
APPLIER CLIP 5 13 M/L LIGAMAX5 (MISCELLANEOUS)
APR CLP MED LRG 5 ANG JAW (MISCELLANEOUS)
BINDER ABDOMINAL 12 ML 46-62 (SOFTGOODS) ×4 IMPLANT
CABLE HIGH FREQUENCY MONO STRZ (ELECTRODE) ×4 IMPLANT
CHLORAPREP W/TINT 26ML (MISCELLANEOUS) ×4 IMPLANT
CLIP APPLIE 5 13 M/L LIGAMAX5 (MISCELLANEOUS) IMPLANT
CLOSURE WOUND 1/2 X4 (GAUZE/BANDAGES/DRESSINGS) ×2
COVER MAYO STAND STRL (DRAPES) ×2 IMPLANT
COVER SURGICAL LIGHT HANDLE (MISCELLANEOUS) ×6 IMPLANT
COVER WAND RF STERILE (DRAPES) ×4 IMPLANT
DEPRESSOR TONGUE BLADE STERILE (MISCELLANEOUS) ×4 IMPLANT
DEVICE SECURE STRAP 25 ABSORB (INSTRUMENTS) ×4 IMPLANT
DEVICE TROCAR PUNCTURE CLOSURE (ENDOMECHANICALS) ×4 IMPLANT
DRAIN CHANNEL 19F RND (DRAIN) ×2 IMPLANT
DRAPE SHEET LG 3/4 BI-LAMINATE (DRAPES) ×2 IMPLANT
DRAPE UNDERBUTTOCKS STRL (DRAPE) ×2 IMPLANT
DRAPE WARM FLUID 44X44 (DRAPE) ×4 IMPLANT
DRSG TEGADERM 2-3/8X2-3/4 SM (GAUZE/BANDAGES/DRESSINGS) ×12 IMPLANT
DRSG TEGADERM 4X4.75 (GAUZE/BANDAGES/DRESSINGS) ×6 IMPLANT
ELECT REM PT RETURN 15FT ADLT (MISCELLANEOUS) ×4 IMPLANT
EVACUATOR SILICONE 100CC (DRAIN) ×2 IMPLANT
GAUZE SPONGE 2X2 8PLY STRL LF (GAUZE/BANDAGES/DRESSINGS) IMPLANT
GLOVE BIO SURGEON STRL SZ 6 (GLOVE) ×4 IMPLANT
GLOVE BIOGEL PI IND STRL 6.5 (GLOVE) IMPLANT
GLOVE BIOGEL PI IND STRL 7.0 (GLOVE) IMPLANT
GLOVE BIOGEL PI INDICATOR 6.5 (GLOVE) ×4
GLOVE BIOGEL PI INDICATOR 7.0 (GLOVE) ×8
GLOVE ECLIPSE 8.0 STRL XLNG CF (GLOVE) ×6 IMPLANT
GLOVE INDICATOR 8.0 STRL GRN (GLOVE) ×6 IMPLANT
GLOVE SURG SS PI 7.0 STRL IVOR (GLOVE) ×8 IMPLANT
GOWN STRL REUS W/ TWL LRG LVL3 (GOWN DISPOSABLE) IMPLANT
GOWN STRL REUS W/TWL LRG LVL3 (GOWN DISPOSABLE) ×16
GOWN STRL REUS W/TWL XL LVL3 (GOWN DISPOSABLE) ×12 IMPLANT
IRRIG SUCT STRYKERFLOW 2 WTIP (MISCELLANEOUS) ×4
IRRIGATION SUCT STRKRFLW 2 WTP (MISCELLANEOUS) IMPLANT
KIT BASIN OR (CUSTOM PROCEDURE TRAY) ×4 IMPLANT
LEGGING LITHOTOMY PAIR STRL (DRAPES) ×2 IMPLANT
MARKER SKIN DUAL TIP RULER LAB (MISCELLANEOUS) ×6 IMPLANT
MATRIX WOUND 3-LAYER 5X5 (Tissue) ×1 IMPLANT
MESH PHASIX ST 15X20 (Mesh General) ×2 IMPLANT
MESH VENTRALIGHT ST 10X13IN (Mesh General) ×2 IMPLANT
MICROMATRIX 1000MG (Tissue) ×8 IMPLANT
NDL INSUFFLATION 14GA 120MM (NEEDLE) IMPLANT
NDL SPNL 22GX3.5 QUINCKE BK (NEEDLE) IMPLANT
NEEDLE INSUFFLATION 14GA 120MM (NEEDLE) ×4 IMPLANT
NEEDLE SPNL 22GX3.5 QUINCKE BK (NEEDLE) ×4 IMPLANT
PAD POSITIONING PINK XL (MISCELLANEOUS) ×4 IMPLANT
PENCIL HANDSWITCHING (ELECTRODE) ×2 IMPLANT
POUCH DRAINABLE 1PC 2 1/4 FLAT (OSTOMY) ×2 IMPLANT
SCISSORS LAP 5X35 DISP (ENDOMECHANICALS) ×2 IMPLANT
SCISSORS LAP 5X45 EPIX DISP (ENDOMECHANICALS) ×2 IMPLANT
SET CHOLANGIOGRAPH MIX (MISCELLANEOUS) ×2 IMPLANT
SLEEVE ADV FIXATION 5X100MM (TROCAR) ×4 IMPLANT
SOLUTION PARTIC MCRMTRX 1000MG (Tissue) IMPLANT
SPONGE GAUZE 2X2 STER 10/PKG (GAUZE/BANDAGES/DRESSINGS) ×2
STRIP CLOSURE SKIN 1/2X4 (GAUZE/BANDAGES/DRESSINGS) ×6 IMPLANT
SUT MNCRL AB 4-0 PS2 18 (SUTURE) ×4 IMPLANT
SUT PDS AB 1 CT1 27 (SUTURE) ×8 IMPLANT
SUT PROLENE 1 CT 1 30 (SUTURE) ×30 IMPLANT
SUT PROLENE 2 0 BLUE (SUTURE) ×2 IMPLANT
TOWEL OR 17X26 10 PK STRL BLUE (TOWEL DISPOSABLE) ×4 IMPLANT
TRAY LAPAROSCOPIC (CUSTOM PROCEDURE TRAY) ×4 IMPLANT
TROCAR ADV FIXATION 11X100MM (TROCAR) IMPLANT
TROCAR ADV FIXATION 5X100MM (TROCAR) ×4 IMPLANT
TROCAR BLADELESS OPT 5 100 (ENDOMECHANICALS) ×4 IMPLANT
TUBING INSUF HEATED (TUBING) ×4 IMPLANT
WOUND MATRIX 3-LAYER 5X5 (Tissue) ×1 IMPLANT
YANKAUER SUCT BULB TIP 10FT TU (MISCELLANEOUS) ×2 IMPLANT

## 2018-07-23 NOTE — Anesthesia Postprocedure Evaluation (Signed)
Anesthesia Post Note  Patient: Lindsay Little  Procedure(s) Performed: LAPAROSCOPIC VENTRAL WALL HERNIA REPAIR (N/A Abdomen) LYSIS OF ADHESIONS (N/A Abdomen) DEBRIS OF VAGINAL RECTAL SINUS WITH ACELL (N/A ) INSERTION OF MESH X2 (N/A Abdomen)     Patient location during evaluation: PACU Anesthesia Type: General Level of consciousness: awake and alert, awake and oriented Pain management: pain level controlled Vital Signs Assessment: post-procedure vital signs reviewed and stable Respiratory status: spontaneous breathing, nonlabored ventilation, respiratory function stable and patient connected to nasal cannula oxygen Cardiovascular status: blood pressure returned to baseline and stable Postop Assessment: no apparent nausea or vomiting Anesthetic complications: no    Last Vitals:  Vitals:   07/23/18 1415 07/23/18 1458  BP: (!) 153/91 (!) 155/86  Pulse: 91 86  Resp: 16 12  Temp: 36.9 C 36.9 C  SpO2: 95% 93%    Last Pain:  Vitals:   07/23/18 1458  TempSrc: Oral  PainSc:                  Catalina Gravel

## 2018-07-23 NOTE — H&P (Signed)
Lindsay Little DOB: June 16, 1966 Married / Language: Lindsay Little / Race: White Female  Patient Care Team: [name], MD as PCP - General (Family Medicine) Jerline Pain, MD as PCP - Cardiology (Cardiology) Michael Boston, MD as Consulting Physician (General Surgery) Truitt Merle, MD as Consulting Physician (Medical Oncology) Kyung Rudd, MD as Consulting Physician (Radiation Oncology) Teena Irani, MD (Inactive) as Consulting Physician (Gastroenterology) Raynelle Bring, MD as Consulting Physician (Urology)    The patient is a 53 year old female who presents with colorectal cancer. Note for "Colorectal cancer": Rectal adenocarcinoma s/p LAR resection 05/25/2015 Colovaginal fistula s/p omentopexy repair 07/12/2016 Colorectal delayed anastomotic leak s/p resection & colostomy 07/12/2016  Patient returns with her husband. Patient notes increasing bulging around her left upper quadrant colostomy site. Underwent recent CAT scan for her rectal cancer. Showed worsening parastomal hernia. Not a large defect but more of her colon going up into it. No definite small bowel. No evidence of any cancer recurrence. A little gas down the pelvis but certainly a smaller sinus. Patient still get some drainage out her vagina. Usually has to change her pad about twice a day. Yellow. Not typically found. Gynecology. Has been given rounds of Diflucan and clindamycin without resolution. 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 This helped somewhat but still struggles. No fevers or chills. She's been intentionally losing weight. Trying to be more physically active but then has struggled with some low back pain and gluteal pain radiating to her legs. No nausea or vomiting. No fissure or chills.     [name] MRN: [id number] DOB/AGE: [birth year] 53 y.o.  Admit date: 07/10/2016 Discharge  date:  Patient Care Team: [name], MD as PCP - General (Family Medicine) Michael Boston, MD as Consulting Physician (General Surgery) Truitt Merle, MD as Consulting Physician (Medical Oncology) Kyung Rudd, MD as Consulting Physician (Radiation Oncology) Teena Irani, MD as Consulting Physician (Gastroenterology) Raynelle Bring, MD as Consulting Physician (Urology)  Discharge Diagnoses: Principal Problem: Colorectal delayed anastomotic leak s/p resection & colostomy 07/12/2016 Active Problems: Rectal adenocarcinoma s/p LAR resection 05/25/2015 Morbid obesity with BMI of 40.0-44.9, adult ([place]) Colovaginal fistula s/p omentopexy repair 07/12/2016 Rectocutaneous fistula Colostomy in place ([place])   POST-OPERATIVE DIAGNOSIS: COLORECTAL ANASTOMOTIC BREAKDOWN WITH COLOVAGINAL FISTULA  PROCEDURE:  LAPAROSCOPIC LYSIS OF ADHESIONS LAPAROSCOPIC RESECTION OF COLORECTAL ANASTOMOSIS END COLOSTOMY OVERSEW OF RECTAL STUMP OMENTOPEXY OF RECTAL STUMP & VAGINAL CUFF  SURGEON: [name], MD  ASSISTANT: RN  ANESTHESIA: local and general  EBL: Total I/O In: 2400 [I.V.:2400] Out: 27 [Urine:260; Blood:200]  Delay start of Pharmacological VTE agent (>24hrs) due to surgical blood loss or risk of bleeding: no  DRAINS: 19 Fr drain with tip in the pelvis  SPECIMEN: PROXIMAL & DISTAL COLORECTAL ANASTOMOSIS  DISPOSITION OF SPECIMEN: PATHOLOGY  COUNTS: YES  PLAN OF CARE: Admit to inpatient  PATIENT DISPOSITION: PACU - hemodynamically stable.  INDICATION:   Morbidly obese female with bulky rectal cancer status post neoadjuvant chemoradiation therapy and robotically assisted resection. Developed delayed hematoma and abscess. Turned in a chronic fistula. Required loop ileostomy diversion and omental pedicle flap. Area close down. No leak by colonoscopy and enema. Underwent ileostomy takedown without incident. Five weeks later he noted worsening pain  and drainage of vagina suspicious for: Bowel fistula. CT scan showing anastomotic breakdown. I recommended endoscopic assisted resection of colorectal anastomosis with permanent end colostomy.  The anatomy & physiology of the digestive  tract was discussed. The pathophysiology was discussed. Natural history risks without surgery was discussed. I worked to give an overview of the disease and the frequent need to have multispecialty involvement. I feel the risks of no intervention will lead to serious problems that outweigh the operative risks; therefore, I recommended a partial colectomy to remove the pathology. Laparoscopic & open techniques were discussed.  Risks such as bleeding, infection, abscess, leak, reoperation, possible ostomy, hernia, heart attack, death, and other risks were discussed. I noted a good likelihood this will help address the problem. Goals of post-operative recovery were discussed as well. We will work to minimize complications. Educational materials on the pathology had been given in the office. Questions were answered.   The patient expressed understanding & wished to proceed with surgery.  OR FINDINGS:  Patient had stool resting on her coccyx in the very distal posterior and right lateral aspects. Breakdown of the anastomosis seen to be more posteriorly.  Remaining rectal stump seemed healthy and noninflamed. About 4 cm stump remains oversewn with the LOC.  No definite evidence of colovaginal fistula breakdown. Nonetheless omentum used as an omentopexy of her rectal stump and vaginal.  End colostomy in left upper quadrant along subcostal space along premarked region.  No obvious metastatic disease on visceral parietal peritoneum or liver.      ADDENDUM REPORT: 07/04/2016 18:48 ADDENDUM: Discussed additional history with Dr. Johney Maine: Patient had a prior leak which was passed with omentum. Postoperative contrast enema showed no contrast  extravasation. An enema tip was inserted into the rectum and a small amount of water-soluble contrast was instilled retrograde. Repeat CT imaging of the pelvis was then performed. Rectal contrast outlines the stool in the sigmoid colon and rectum, including the large collection of stool in the presacral space. Multiplanar images show that the stool is present within a large patulous rectosigmoid colon without definite contrast extravasation. This does not appear to be a separate contiguous collection but a distended rectosigmoid segment. Also identified is presence of contrast material within the vagina compatible rectovaginal fistula, question arising at the RIGHT superolateral aspect of the vaginal cuff. Additionally, a linear collection of contrast material is seen extending posteriorly from the rectum RIGHT of the sacrum, extending into the medial aspect of the RIGHT gluteus maximus, consistent with an additional fistulous track. Infiltrative changes extend dorsally from the site on axial images into the subcutaneous fat. Electronically Signed By: Lavonia Dana M.D. On: 07/04/2016 18:48 Addended by Lavonia Dana, MD on 07/04/2016 6:50 PM  Study Result CLINICAL DATA: Colon cancer post resection, chemotherapy and radiation therapy, possible fistula EXAM: CT ABDOMEN AND PELVIS WITH CONTRAST TECHNIQUE: Multidetector CT imaging of the abdomen and pelvis was performed using the standard protocol following bolus administration of intravenous contrast. CONTRAST: 75m ISOVUE-300 IOPAMIDOL (ISOVUE-300) INJECTION 61% PO, 1078mISOVUE-300 IOPAMIDOL (ISOVUE-300) INJECTION 61% IV COMPARISON: 02/21/2016 FINDINGS: Lower chest: Lung bases clear Hepatobiliary: Poorly defined area of low-attenuation RIGHT lobe of liver centrally image 17 measuring 2.1 x 1.8 cm. Additional area of poorly defined low-attenuation lateral aspect RIGHT lobe of liver more inferiorly, 2.3 x 1.6 cm image 29. These lesions  have not significantly changed in size since the previous exam. Pancreas: Normal appearance Spleen: Normal appearance Adrenals/Urinary Tract: Adrenal glands normal appearance. Kidneys and ureters normal appearance. RIGHT hydronephrosis and hydroureter seen on previous exam resolved. Bladder wall appears mildly thickened but bladder is underdistended question artifact. Stomach/Bowel: Stomach normal appearance. Small bowel loops nondistended without wall thickening. Increased stool throughout colon. Interval  takedown of ostomy in RIGHT upper quadrant question loop ileostomy. Staple line at enterotomy closure in the RIGHT mid abdomen. Large subcutaneous fluid collection at site of ostomy take-down, 9.3 x 4.3 x 5.3 cm, may represent a sterile or infected collection. Mild overlying skin thickening and minimal surrounding infiltrative changes. Rectum distended by stool with mild thickening of the wall with adjacent infiltration of presacral and perirectal fat, question related to radiation therapy and prior surgery. A large collection of stool and gas is seen within the presacral space. This appears to be predominantly cranial to the anastomotic suture line more inferiorly. This roughly occupies the same location as the gas and fluid collection identified on the prior CT. While this could represent stool within a distended rectum, the fact that this courses cranially in the same distribution as the previous abscess collection suggests this may be stool extraluminal to the rectosigmoid colon within a contiguous contained collection. This overall measures approximately 8 cm transverse by 7 cm AP and extends 9 cm length. Vascular/Lymphatic: Multiple normal sized para-aortic and LEFT common iliac nodes. No additional abdominal or pelvic adenopathy. Aorta normal caliber. 7 mm LEFT external iliac node image 86 unchanged. Multiple small nodules in the fat adjacent to the rectosigmoid colon axial images 73, 74, 78  question tiny lymph nodes, largest 8 mm short axis coronal image 62, minimally increased. Reproductive: Post hysterectomy. RIGHT ovary not visualized. Suspect visualized LEFT ovary. Air within vagina. Other: No free air or free fluid. No hernia. Musculoskeletal: Osseous structures unremarkable. IMPRESSION: Subcutaneous fluid collection at the site of ileostomy takedown, could be sterile or infected, measuring 9.3 x 4.3 x 5.3 cm; this could be aspirated under ultrasound guidance if clinically indicated. Large collection of stool within the presacral space, could potentially be within a distended distorted rectum but since this occupies the same position as the abscess collection identified on the previous CT this is suspicious for a contiguous contained extraluminal stool collection communicating with the rectosigmoid colon, collection overall roughly 7 x 8 x 9 cm. Electronically Signed: By: Lavonia Dana M.D. On: 07/04/2016 16:58  Result History  CT ABDOMEN PELVIS W CONTRAST (Order #[id number]) on 07/04/2016 - Order Result History Report - Result Edited <[email or web address]> Result Notes  Notes Recorded by Michael Boston, MD on 07/05/2016 at 7:44 AM EST Called and discussed with patient. She is eating okay. Intermittent diarrhea controlled with Imodium. I suspect its more overflow diarrhea around constantly chronically constipated rectum. While she is severely disappointed that the anastomosis is again broken down and she needs a colostomy, she would really like to wait until after the holiday season before planning surgery, especially since she struggled recovering last year. She is having some drainage of the vagina but it is not severe and it is manageable. She has some soreness but she does not have severely debilitating pelvic pain. She is not need narcotics around the clock and when she has works. She is ruling well on it though. She will keep her appointment with me next  week to touch base. I will set up a time in the next couple weeks to do a laparoscopic assisted takedown anastomosis and colostomy. She needs more pain medications. I will ask my partner to fill an Rx. #40 ------ Notes Recorded by Michael Boston, MD on 07/05/2016 at 6:52 AM EST Patient completed ordered test/studies. Results noted below. Patient with leak of contrast to vagina suspecting for delayed colovaginal fistula. Most likely at the apex. I think her rectum has failed her  again. I think she requires resection and takedown of her distal colorectal anastomosis with permanent colostomy. Despite the drains, surgeries, fecal diversion, omental pedicle flap and a normal colonoscopy and normal enema, the area has broken down again. I think that her neorectum refuses to adequately heal despite the numerous surgeries and interventions. I will call & discuss with the patient [name], M.D., F.A.C.S. Gastrointestinal and Minimally Invasive Surgery [facility] [address] [place], [place] [address] [phone] Main / Paging    Patient ID: SARIKA BALDINI MRN: [id number] DOB/AGE: 1966-04-18 53 y.o.  Admit date: 05/31/2016 Discharge date: 06/03/2016  Patient Care Team: [name], MD as PCP - General (Family Medicine) Michael Boston, MD as Consulting Physician (General Surgery) Truitt Merle, MD as Consulting Physician ([facility]) Kyung Rudd, MD as Consulting Physician (Radiation Oncology) Teena Irani, MD as Consulting Physician (Gastroenterology) Raynelle Bring, MD as Consulting Physician (Urology)  Admission Diagnoses: Principal Problem: Colorectal delayed anastomotic leak - healed, s/p ileostomy takedown 05/31/2016 Active Problems: Rectal adenocarcinoma s/p LAR resection 05/25/2015 Morbid obesity with BMI of 40.0-44.9, adult ([place]) Crohn's disease ([place]) Hypertension IBS (irritable bowel syndrome) Depression Pelvic  abscess s/p drainage & omental pedicle flap 11/17/2015   Discharge Diagnoses: Principal Problem: Colorectal delayed anastomotic leak - healed, s/p ileostomy takedown 05/31/2016 Active Problems: Rectal adenocarcinoma s/p LAR resection 05/25/2015 Morbid obesity with BMI of 40.0-44.9, adult ([place]) Crohn's disease ([place]) Hypertension IBS (irritable bowel syndrome) Depression Pelvic abscess s/p drainage & omental pedicle flap 11/17/2015   POST-OPERATIVE DIAGNOSIS:  loop ileostomy in place for fecal diversion  SURGERY: 05/31/2016  Procedure(s): TAKEDOWN LOOP ILEOSTOMY  SURGEON:   Surgeon(s): Michael Boston, MD  INDICATION: Pleasant patient status post low anterior resection for bulky rectal cancer. Developed collection that turned and abscess with delayed anastomotic leak and fistula. Eventually required fecal diversion. Had reoperation with omental pedicle flap. Biopsies disprove cancer recurrence. Anastomosis closed and healed. Colonoscopy two days ago showing omentum granulating at colorectal anastomosis otherwise no evidence of cancer recurrence or other cancers. The patient has recovered from that surgery with the anastomosis well-healed. It was felt safe to have the loop ileostomy taken down. I discussed the procedure with the patient:  The anatomy & physiology of the digestive tract was discussed. The pathophysiology was discussed. Possibility of remaining with an ostomy permanently was discussed. I offered ostomy takedown. Laparoscopic & open techniques were discussed.  Risks such as bleeding, infection, abscess, leak, reoperation, possible re-ostomy, injury to other organs, hernia, heart attack, death, and other risks were discussed. I noted a good likelihood this will help address the problem. Goals of post-operative recovery were discussed as well. We will work to minimize complications. Questions were answered. The patient expresses  understanding & wishes to proceed with surgery.  OR FINDINGS: Thick abdominal wall with dense adhesions to the diverting loop ileostomy but no parastomal hernia. Normal anatomy.   Problem List/Past Medical [name], MD; 01/12/2018 9:21 AM) RECTAL ADENOCARCINOMA (C20) INCISIONAL HERNIA, INCARCERATED (K43.0) URINARY FREQUENCY (R35.0) LARGE BOWEL ANASTOMOTIC LEAK (K91.89) ENCOUNTER FOR PREOPERATIVE EXAMINATION FOR GENERAL SURGICAL PROCEDURE (Z01.818) ILEOSTOMY IN PLACE (Z93.2) HYPOKALEMIA (E87.6) THROMBOSIS DUE TO CENTRAL VENOUS ACCESS DEVICE, INITIAL ENCOUNTER (G86.761P) S/P COLON RESECTION - 1st postop visit (Z90.49) COLOVAGINAL FISTULA (N82.4) PERIRECTAL FISTULA (K60.4) VISIT FOR WOUND CHECK (Z51.89) COLOSTOMY IN PLACE (Z93.3) CHRONIC PELVIC PAIN IN FEMALE (R10.2) PORT CATHETER IN PLACE (Z95.828) PERITONEAL-VAGINAL FISTULA (N82.8) PERITONEAL FISTULA (K66.9)  Past Surgical History [name], MD; 01/12/2018 9:21 AM)  Colon Polyp Removal - Colonoscopy Hysterectomy (not due to cancer) - Partial Knee Surgery Left. Oral Surgery Shoulder Surgery Left.  Diagnostic Studies History [name], MD; 01/12/2018 9:21 AM) Colonoscopy >10 years ago Mammogram 1-3 years ago Pap Smear 1-5 years ago  Allergies (Tanisha A. Owens Shark, RMA; 01/12/2018 9:06 AM) Lidocaine *CHEMICALS* All of the Caine's except MARCAINE is ok per the pt Penicillin G Benzathine & Proc *PENICILLINS* Sulfabenzamide *CHEMICALS* Allergies Reconciled  Medication History (Tanisha A. Owens Shark, [place]; 01/12/2018 9:07 AM) Gabapentin (300MG Tablet, Oral) Active. Melatonin (3MG Capsule, Oral) Active. Coenzyme Q10 (100MG Capsule, Oral) Active. DULoxetine HCl (60MG Capsule DR Part, Oral) Active. Hyoscyamine Sulfate ER (0.375MG Tablet ER 12HR, Oral) Active. Furosemide (20MG Tablet, Oral) Active. Hydrochlorothiazide (25MG Tablet, Oral) Active. Meloxicam (15MG Tablet, Oral)  Active. Crestor (5MG Tablet, Oral) Active. Bystolic (41DE Tablet, Oral) Active. Robaxin (500MG Tablet, Oral) Active. Vitamin D (1000UNIT Tablet, Oral) Active. Medications Reconciled  Pregnancy / Birth History [name], MD; 01/12/2018 9:21 AM) Age at menarche 38 years. Age of menopause 24-50 Gravida 4 Irregular periods Maternal age 29-20 Para 0  Other Problems [name], MD; 01/12/2018 9:21 AM) Back Pain Diverticulosis Gastroesophageal Reflux Disease General anesthesia - complications High blood pressure Hypercholesterolemia Other disease, cancer, significant illness Rectal Cancer Transfusion history    Vitals (Tanisha A. Brown RMA; 01/12/2018 9:06 AM) 01/12/2018 9:05 AM Weight: 321.4 lb Height: 70.5in Body Surface Area: 2.57 m Body Mass Index: 45.46 kg/m  Temp.: 37F  Pulse: 84 (Regular)  BP: 132/84 (Sitting, Left Arm, Standard)      Physical Exam [name] MD; 01/12/2018 9:50 AM)  General Mental Status-Alert. General Appearance-Not in acute distress. Voice-Normal. Note: Smiling. Moves around easily. No nervous leg shaking  Integumentary Global Assessment Normal Exam - Distribution of scalp and body hair is normal. General Characteristics Overall Skin Surface - no rashes and no suspicious lesions.  Head and Neck Head-normocephalic, atraumatic with no lesions or palpable masses. Face Global Assessment - atraumatic, no absence of expression. Neck Global Assessment - no abnormal movements, no decreased range of motion. Trachea-midline. Thyroid Gland Characteristics - non-tender.  Eye Eyeball - Left-Extraocular movements intact, No Nystagmus. Eyeball - Right-Extraocular movements intact, No Nystagmus. Upper Eyelid - Left-No Cyanotic. Upper Eyelid - Right-No Cyanotic. Note: Wears glasses. Vision acceptable  Chest and Lung Exam Inspection Accessory muscles - No use of  accessory muscles in breathing. Note: R infraclavilcuar port clean without cellulitis  Abdomen Note: Morbidly obese but soft with clean panniculus. No rash   Left upper quadrant colostomy with intact bag and no leakage or drainage. Nice rosebud colostomy.  Other port site incisions with normal healing ridges. No guarding/rebound tenderness. Really no pain. No hernias. No drainage.  Female Genitourinary Note: Thick yellow drainage around the introitus. Vaginal cuff smooth and contracted to about 5 cm at the apex. Sensitive but no major pain. Suspicious for persistent fistula to peritoneal cul-de-sac.  Rectal Note: Perianal skin clear. Small anal tag. Normal sphincter tone. Digital exam goes up into the presacral space consistent with a completely disrupted rectal cuff. I can feel smooth fat anteriorly consistent with her omental flap. 4x3x2 cm presacral cavity/rectal cuff region. More narrow and smaller Closed off. Sensitive but not friable. Cannot feel a definite connection to the posterior vaginal cuff. No hard mass or ulceration. No evidence of cancer recurrence.  Peripheral Vascular Upper Extremity Inspection - Left - Not Gangrenous, No Petechiae. Right - Not Gangrenous, No Petechiae.  Neurologic Neurologic evaluation reveals -normal attention  span and ability to concentrate, able to name objects and repeat phrases. Appropriate fund of knowledge and normal coordination.  Neuropsychiatric Mental status exam performed with findings of-able to articulate well with normal speech/language, rate, volume and coherence and no evidence of hallucinations, delusions, obsessions or homicidal/suicidal ideation. Orientation-oriented X3. Note: Smiling.  Musculoskeletal Global Assessment Gait and Station - normal gait and station.  Lymphatic General Lymphatics Description - No Generalized lymphadenopathy.    Assessment & Plan [name] MD;  01/12/2018 1:50 PM)  PARASTOMAL HERNIA WITHOUT OBSTRUCTION OR GANGRENE (K43.5)  Ready for surgery  Parastomal hernia at permanent end colostomy.  She would benefit from repair.  She is at risk for complications infection since she tends to get any complications. The anatomy & physiology of the abdominal wall was discussed. The pathophysiology of hernias was discussed. Natural history risks without surgery including progeressive enlargement, pain, incarceration, & strangulation was discussed. Contributors to complications such as smoking, obesity, diabetes, prior surgery, etc were discussed.  I feel the risks of no intervention will lead to serious problems that outweigh the operative risks; therefore, I recommended surgery to reduce and repair the hernia. I explained laparoscopic techniques with possible need for an open approach. I noted the probable use of mesh to patch and/or buttress the hernia repair  Risks such as bleeding, infection, abscess, need for further treatment, heart attack, death, and other risks were discussed. I noted a good likelihood this will help address the problem. Goals of post-operative recovery were discussed as well. Possibility that this will not correct all symptoms was explained. I stressed the importance of low-impact activity, aggressive pain control, avoiding constipation, & not pushing through pain to minimize risk of post-operative chronic pain or injury. Possibility of reherniation especially with smoking, obesity, diabetes, immunosuppression, and other health conditions was discussed. We will work to minimize complications.  An educational handout further explaining the pathology & treatment options was given as well. Questions were answered. The patient expresses understanding & wishes to proceed with surgery.  Pt Education - [facility] Hernia Post-Op HCI (Eloyce Bultman): discussed with patient and provided information. Pt Education - [facility] Pain Control  (Taron Conrey) Pt Education - Pamphlet Given - Laparoscopic Hernia Repair: discussed with patient and provided information. Pt Education - [facility] Mesh education: discussed with patient and provided information.  PERITONEAL-VAGINAL FISTULA (N82.8) Impression: Pinhole fistula at the apex of her vaginal cuff going up to the peritoneum. Low output but not resolved.  I suspect she is getting peritoneal fluid in the cul-de-sac that intermittently drains at the vagina. Changes pad twice a day. There is been no proof that there is actual infection and no improvement with trials of Diflucan and clindamycin. I'm skeptical that it will taper off more. By 2 years, that should be as good as it gets. Could try more formal closure of it with washout debridement and closure of the sphincter with perhaps temporary drainage, but I am guarded against doing that though. I will discuss with my colorectal partners  Current Plans Return to clinic as needed.  Soreness, decreased appetite, and poor energy level are common problems after surgery. While many people can struggle with a bad day, these concerns should gradually fade away or at least improve. Much of your recovery depends on your health & the severity of your operation. Please call if you have any further questions / concerns related to surgery.  Increase activity as tolerated to regular everyday activity. Consider daily low impact exercise every day such as walking  an hour a day.  Do not push through pain. If it hurts to do it, then don't do it.  Diet as tolerated. Low fat high fiber diet ideal. 30 g fiber a day ideal. Consider taking a daily fiber supplement to keep your bowels regular.  Followup with your primary care physician for other health issues as would normally be done.  Consider screening for malignancies (breast, prostate, colon, melanoma, etc) as appropriate. Discuss with you primary care physician.  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 Smaller cavity now about 5 x 3 x 1 cm. Definitely smaller - about half the size it used to be. Collapsed with omentum in it. No evidence of any cancer recurrence by palpation Hopefully it will gradually taper down most likely will be a persistent source of chronic drainage as well. Can try and do a PVR of rectal stump and maybe oversew vaginal cuff, but I'm guarded that that will work. We'll discuss with my colorectal partners to see if they would do it in conjunction or try and hold off.   CHRONIC PELVIC PAIN IN FEMALE (R10.2) Impression: Intermittent intense flares but not a chronic problem. There is still time for things to calm down.  I strongly recommend she establish with a chronic pain specialist to help manage some of these issues especially with recurrent flares. She will discuss with her primary care physician and establish. She was just started on some gabapentin. She is only on 200 mg twice a day. There is plenty of room to go up. I recommend she take double dose at bedtime and consider going up until her pain is under better control. Continue meloxicam. Continue Robaxin as needed.  I do not want to give her any more narcotics. I have given her "one more prescription" many times in the past 6 months. I think she needs a second opinion with a chronic pain specialist.  Continue use vaginal and perineal stretching with pelvic floor physical therapist.   RECTAL ADENOCARCINOMA (C20) Impression: Complicated recovery due to late abscess and contained anastomotic leak with some help of percutaneous drainage and antibiotics.  No evidence of cancer recurrence by vaginal, rectal and CT evaluations  Continue follow-up appointment with her medical oncologist. Dr. Burr Medico just saw her. CT scan evaluation last week reassuring for no distant disease. No new pelvic  collection or abscess or recurrence. Follow-up chemotherapy   COLOSTOMY IN PLACE (Z93.3)  Current Plans Pt Education - [facility] Ostomy HCI (Tiron Suski): discussed with patient and provided information.  [name], MD, FACS, MASCRS Gastrointestinal and Minimally Invasive Surgery    [address] [place], [place] [address] [phone] Main / Paging [phone] Fax

## 2018-07-23 NOTE — Anesthesia Preprocedure Evaluation (Addendum)
Anesthesia Evaluation  Patient identified by MRN, date of birth, ID band Patient awake    Reviewed: Allergy & Precautions, NPO status , Patient's Chart, lab work & pertinent test results, reviewed documented beta blocker date and time   History of Anesthesia Complications (+) PONV and history of anesthetic complications  Airway Mallampati: II  TM Distance: >3 FB Neck ROM: Full    Dental  (+) Teeth Intact, Dental Advisory Given   Pulmonary sleep apnea , former smoker,    Pulmonary exam normal breath sounds clear to auscultation       Cardiovascular hypertension, Pt. on home beta blockers Normal cardiovascular exam Rhythm:Regular Rate:Normal     Neuro/Psych PSYCHIATRIC DISORDERS Anxiety Depression  Neuromuscular disease    GI/Hepatic Neg liver ROS, hiatal hernia, GERD  Medicated,Rectal adenocarcinoma S/p chemo/radiation   Endo/Other  Morbid obesity  Renal/GU negative Renal ROS     Musculoskeletal  (+) Arthritis ,   Abdominal   Peds  Hematology  (+) Blood dyscrasia, anemia ,   Anesthesia Other Findings Day of surgery medications reviewed with the patient.  Reproductive/Obstetrics                             Anesthesia Physical Anesthesia Plan  ASA: III  Anesthesia Plan: General   Post-op Pain Management:    Induction: Intravenous  PONV Risk Score and Plan: 4 or greater and Diphenhydramine, Scopolamine patch - Pre-op, Midazolam, Dexamethasone and Ondansetron  Airway Management Planned: Oral ETT  Additional Equipment:   Intra-op Plan:   Post-operative Plan: Extubation in OR  Informed Consent: I have reviewed the patients History and Physical, chart, labs and discussed the procedure including the risks, benefits and alternatives for the proposed anesthesia with the patient or authorized representative who has indicated his/her understanding and acceptance.   Dental advisory  given  Plan Discussed with: CRNA  Anesthesia Plan Comments:         Anesthesia Quick Evaluation

## 2018-07-23 NOTE — Transfer of Care (Signed)
Immediate Anesthesia Transfer of Care Note  Patient: Lindsay Little  Procedure(s) Performed: LAPAROSCOPIC VENTRAL WALL HERNIA REPAIR (N/A Abdomen) LYSIS OF ADHESIONS (N/A Abdomen) DEBRIS OF VAGINAL RECTAL SINUS WITH ACELL (N/A ) INSERTION OF MESH X2 (N/A Abdomen)  Patient Location: PACU  Anesthesia Type:General  Level of Consciousness: awake and patient cooperative  Airway & Oxygen Therapy: Patient Spontanous Breathing and Patient connected to face mask  Post-op Assessment: Report given to RN and Post -op Vital signs reviewed and stable  Post vital signs: Reviewed and stable  Last Vitals:  Vitals Value Taken Time  BP 149/79 07/23/2018  1:24 PM  Temp    Pulse 83 07/23/2018  1:28 PM  Resp 18 07/23/2018  1:28 PM  SpO2 98 % 07/23/2018  1:28 PM  Vitals shown include unvalidated device data.  Last Pain:  Vitals:   07/23/18 0602  TempSrc: Oral  PainSc:       Patients Stated Pain Goal: 4 (91/36/85 9923)  Complications: No apparent anesthesia complications

## 2018-07-23 NOTE — Consult Note (Signed)
[place] Nurse ostomy follow up Patient in PACU awaiting release to inpatient room. Stoma type/location: LUQ colostomy Stomal assessment/size: Not measured Peristomal assessment: Deferred Treatment options for stomal/peristomal skin: Barrier ring and convexity, belt Output: None Ostomy pouching: 1pc. Flat currently on. Very high probability the patient will need convexity, a barrier ring, and ostomy belt.  Val Riles, RN, MSN, CWOCN, CNS-BC, pager [phone]

## 2018-07-23 NOTE — Op Note (Signed)
07/23/2018  PATIENT:  Lindsay Little  53 y.o. female  Patient Care Team: [name], MD as PCP - General (Family Medicine) Jerline Pain, MD as PCP - Cardiology (Cardiology) Michael Boston, MD as Consulting Physician (General Surgery) Truitt Merle, MD as Consulting Physician (Medical Oncology) Kyung Rudd, MD as Consulting Physician (Radiation Oncology) Teena Irani, MD (Inactive) as Consulting Physician (Gastroenterology) Raynelle Bring, MD as Consulting Physician (Urology)  PRE-OPERATIVE DIAGNOSIS:  PARASTOMAL INCARCERATED ABDOMINAL WALL HERNIA, CHRONIC RECTOVAGINAL SINUS  POST-OPERATIVE DIAGNOSIS:   PARASTOMAL INCARCERATED ABDOMINAL WALL HERNIA CHRONIC ANO-PERITONEAL FISTULA SINUS CHRONIC VAGINO-PERITONEAL FISTULA SINUS  PROCEDURE:   LAPAROSCOPIC VENTRAL WALL HERNIA REPAIR LYSIS OF ADHESIONS DEBRIDEMENT & [place] FISTULA SINUS  SURGEON:  [name], MD  ASSISTANT: Nurse   ANESTHESIA:     General  Nerve block provided with liposomal bupivacaine (Experel) mixed with 0.25% bupivacaine as a Bilateral TAP block x40 mL EACH SIDE at the level of the transverse abdominis & preperitoneal spaces along the flank at the anterior axillary line, from subcostal ridge to iliac crest under laparoscopic guidance   Local anesthesia field block: (0.25% bupivacaine & liposomal  Bupivacaine [Experel]) at perianal & introital regions  EBL:  Total I/O In: 2150 [I.V.:2000; IV Piggyback:150] Out: 335 [Urine:285; Blood:50]  Per anesthesia record  Delay start of Pharmacological VTE agent (>24hrs) due to surgical blood loss or risk of bleeding:  no  DRAINS: none   SPECIMEN:  No Specimen  DISPOSITION OF SPECIMEN:  N/A  COUNTS:  YES  PLAN OF CARE: Admit to inpatient   PATIENT DISPOSITION:  PACU - hemodynamically stable.  INDICATION: Pleasant morbidly obese patient history of very  low rectal cancer complicated resection of the fact: Resections with and ultimately ending up with colostomy. Chronic vaginal and anal stump drainage to significant chronic fistulous tracts prior dimensions.  There is no with bowel up and causing discomfort.  Symptoms are made recommendation for exploration and repair of parastomal hernia.  Discussed with Dr Henrietta Hoover with plastic reconstructive surgery who recommended debridement of peritoneal fistulae with A-Cell depositions to help encourage chronic wound closure.   Recommendation was made for surgical repair:   The anatomy & physiology of the abdominal wall was discussed. The pathophysiology of hernias was discussed. Natural history risks without surgery including progeressive enlargement, pain, incarceration & strangulation was discussed. Contributors to complications such as smoking, obesity, diabetes, prior surgery, etc were discussed.   I feel the risks of no intervention will lead to serious problems that outweigh the operative risks; therefore, I recommended surgery to reduce and repair the hernia. I explained laparoscopic techniques with possible need for an open approach. I noted the probable use of mesh to patch and/or buttress the hernia repair  Risks such as bleeding, infection, abscess, need for further treatment, heart attack, death, and other risks were discussed. I noted a good likelihood this will help address the problem. Goals of post-operative recovery were discussed as well. Possibility that this will not correct all symptoms was explained. I stressed the importance of low-impact activity, aggressive pain control, avoiding constipation, & not pushing through pain to minimize risk of post-operative chronic pain or injury. Possibility of reherniation especially with smoking, obesity, diabetes, immunosuppression, and other health conditions was discussed. We will work to minimize complications.   An educational handout further  explaining the pathology & treatment options was given as well. Questions were answered. The patient expresses understanding &  wishes to proceed with surgery.   OR FINDINGS: Patient had chronic 1cm sinus tract from right superior vaginal cuff 8 cm from the introitus into the pelvic peritoneum.  Plugged with 1 gram/37m NS paste of Acell micro-matrix as well as a 50 x 15 mm patch of 3 layer Cytol -ACell  Patient had complete disruption of the anal stump with a tract going up 9 cm along the presacral space between obvious omentum from prior pedicle flap.  Plugged with 1 gram/463mNS paste of Acell micro-matrix as well as a 50 x 35 mm patch of 3 layer Cytol -ACell.  Patient had a paracolostomy hernia incarcerated with mid transverse colon and greater omentum.  The small bowel had been reduced out of it.  7 x 6 cm defect.  Posterior component separation release done with primary closure around it.  Phasix mesh underlay Sugarbaker type reinforcement 20x1566mbliquely/transversely towards the left subcostal ridge.  Larger sheet of 35x27 cm Bard mesh underneath that from the left flank posterior axillary line to across the right upper quadrant midline.  Upper edge above the subcostal ridge by 5l centimeters  Type of repair: Laparoscopic underlay repair immediately under colon   Placement of mesh: Intraperitoneal underlay repair  Name of mesh: Phasix Mesh (a knitted monofilament mesh scaffold using Poly-4-hydroxybutyrate (P4HB), a biologically derived, fully resorbable material)  Size of mesh: 20x15cm  Orientation: Transverse  Mesh overlap:  5cm    Type of repair: Laparoscopic underlay repair under Phasix   Placement of mesh: Intraperitoneal underlay repair  Name of mesh: Bard Ventralight dual sided (polypropylene / Seprafilm)  Size of mesh: 33x27cm  Orientation: Transverse  Mesh overlap:  10cm    DESCRIPTION:   Informed consent was confirmed. The patient underwent general anaesthesia  without difficulty. The patient was positioned appropriately. VTE prevention in place. The patient's abdomen & perineum was clipped, prepped, & draped in a sterile fashion. Surgical timeout confirmed our plan.    I began with examination under anesthesia.  I placed Foley catheter sterilely.  I could feel and confirm a 1 cm circular defect in the apex of the vaginal cuff in the right anterior aspect.  I can probe through that at least 4 cm to a narrowed tract.  This seem consistent with the main area of drainage and discomfort for the patient.  Gently debrided out the region.  I then did rectal examination.  Again confirmed that the anal stump was wide open and I could digitize up to 9 cm along the presacral space to a closed cavity.  Anteriorly consistent with greater omentum.  After treating the areas and assuring hemostasis, I had up plugging and patching the regions with a combination of A-Cell.  micro-matrix as well as Cytol.  Patient had had complaints of drainage for over a year and had failed other interventions.  I discussed with plastic reconstructive surgery and they felt this is reasonable in the hopes to help close this down.  Discussed with my colorectal partners, Dr. Th[place]ello [place] well.  We all agreed this would be beneficial.  Please see operative findings above.  Focused on the parastomal incisional hernia repair.  Informed consent was confirmed.  The patient underwent general anaesthesia without difficulty.  The patient was positioned appropriately.  I could feel a moderate volume of tissue in the parastomal hernia.  I was able to partially reduce it down.  Surgical timeout confirmed our plan.  Peritoneal entry with a laparoscopic port was obtained using Varess  spring needle entry technique in the left upper abdomen as the patient was positioned in reverse Trendelenburg.  I induced carbon dioxide insufflation.  No change in end tidal CO2 measurements.  Full symmetrical abdominal  distention.  Initial port was carefully placed in the right midabdomen away from prior incisions and ostomies..  Camera inspection revealed no injury.  Extra ports were carefully placed under direct laparoscopic visualization.  I could see  on the parietal peritoneum under the abdominal wall.  See large swath of greater omentum as well as some redundant transverse colon going up in the left upper quadrant fascial defect consistent with parastomal hernia at least partially incarcerated.  I did laparoscopic lysis of adhesions to expose the entire anterior abdominal wall.  I gradually was able to reduce a large swath of greater omentum incarcerated in a giant hernia sac.  Freddrick March off some of the end colon as well to help straighten it and reduce it down.  She had a foot of transverse colon up within it as well that I reduced down.  The mesentery of the left upper colon could easily stretch towards the spleen in the left subcostal region.  I primarily used focused sharp dissection.  I freed off the falciform ligament and central peritoneum to expose the retrorectus fascia   I made sure hemostasis was good.  I mapped out the region using a needle passer.     It was rather circular parastomal defect and rather fibrotic with not much give.  I proceeded to do some component separation relaxing incisions on the peritoneal side laparoscopically with cautery scissors.  I did component releases  5 cm superior and inferior to the defect in a transverse/oblique fashion for release of the transverse abdominis posterior abdominal wall fascia..  At that helped relax the parastomal hernia.  I primarily closed it with #1 PDS with a laparoscopic suture passer and tied down with decreased insufflation.  That helped close the parastomal hernia for a much more snug fascial defect around the end colostomy much more appropriately.  Decided to do a Youth worker to help protect the colon using absorbable Phasix mesh.  Chose a 20 x 15  cm sheet and placed #1 Prolene sutures x8 around the edges.  Because I did not trust that would have good long-term results in this morbidly obese woman with moderate hernia, I decided to have a permanent Bard polypropylene mesh underneath all that for stronger reinforcement.   To ensure that I would have at least 10 cm radial coverage outside of the hernia defect, I chose a 33x27cm dual sided mesh.  I placed #1 Prolene stitches around its edge about every 5 cm = 14 total.  I rolled the meshs & placed into the peritoneal cavity through an incision close to the midline, the most medial corner of the large parastomal hernia sac.  Brought him carefully under laparoscopic visualization avoiding any injury or abnormality to the colon.  Skin defect 7 cm medial to the colostomy.  I unrolled the mesh and positioned it appropriately.  I secured the mesh to cover up the hernia defect using a laparoscopic suture passer to pass the tails of the Prolene through the abdominal wall & tagged them with clamps for good transfascial suturing.  I started out in four corners to make sure I had the mesh centered under the hernia defect appropriately, and then proceeded to work in quadrants.  This allowed the Phasix mesh to act as a hammock to help pull  the colon laterally in a classic Sugarbaker type repair.  Therefore the colon was exiting out of the mesh along the midaxillary line subcostal ridge  We evacuated CO2 & desufflated the abdomen.  I tied the fascial stitches down.  I reinsufflated the abdomen.  I used an absorbable tacker to help tacked the edges of the mesh gently so that there was a nice snug but gentle hemic of Phasix mesh around the colon.  A provided at least 5 cm coverage around the closed parastomal hernia.  I then unrolled the larger sheet of Bard mesh and brought that up with even more lateral tacking.  Ended up placing stitches above the costal line along the left ribs as well.  The mesh provided at least  circumferential coverage around the entire region of hernia defects.  I secured the mesh centrally with an additional trans fascial stitch in & out the mesh using #1 prolene under laparoscopic visualization medial to the defect to avoid it from pulling.  I also used that to help bring the falciform ligament and upper peritoneum back into help cover and incorporate the mesh.  Greater omentum laid well around all the mesh and no small bowel exposed to it..   I tacked the edges & central part of the mesh to the peritoneum/posterior rectus fascia with SecureStrap absorbable tacks.   I did reinspection. Hemostasis was good. Mesh laid well..Irrigation of antibiotic clindamycin/gentamicin solution primarily in the hernia sac and around the mesh.  There is no evidence of any infection or breech or injury but wished to minimize the risk of infection and woman has had numerous infections and other problems.  Laparoscopically took a  laparoscopic grasper between the mesh and peritoneum and out into the colostomy closure fascia & hernia sac.  Was able to get a 49 [place] Blake drain through the skin into the hernia sac.  I could grab the drain with a laparoscopic grasper and bring the drain out to the right lateral port site.  Secured the skin with Prolene suture.  This would allow a drain to help decompress the giant hernia sac.  Capnoperitoneum was evacuated. Ports were removed. The skin was closed with Monocryl at the port sites and Steri-Strips on the fascial stitch puncture sites.  Patient is being extubated to go to the recovery room.  I discussed operative findings, updated the patient's status, discussed probable steps to recovery, and gave postoperative recommendations to the patient's family.  Recommendations were made.  Questions were answered.  They expressed understanding & appreciation.  [name], M.D., F.A.C.S. Gastrointestinal and Minimally Invasive Surgery [facility] [address], [place], [place] [address] [phone] Main / Paging  07/23/2018 1:07 PM

## 2018-07-23 NOTE — Anesthesia Procedure Notes (Signed)
Procedure Name: Intubation Date/Time: 07/23/2018 7:49 AM Performed by: Claudia Desanctis, CRNA Pre-anesthesia Checklist: Patient identified, Emergency Drugs available, Suction available and Patient being monitored Patient Re-evaluated:Patient Re-evaluated prior to induction Oxygen Delivery Method: Circle system utilized Preoxygenation: Pre-oxygenation with 100% oxygen Induction Type: IV induction Ventilation: Mask ventilation without difficulty Laryngoscope Size: 2 and Miller Grade View: Grade I Tube type: Oral Tube size: 7.5 mm Number of attempts: 1 Airway Equipment and Method: Stylet Placement Confirmation: ETT inserted through vocal cords under direct vision,  positive ETCO2 and breath sounds checked- equal and bilateral Secured at: 21 cm Tube secured with: Tape Dental Injury: Teeth and Oropharynx as per pre-operative assessment

## 2018-07-23 NOTE — Interval H&P Note (Signed)
History and Physical Interval Note:  07/23/2018 7:31 AM  Lindsay Little  has presented today for surgery, with the diagnosis of PARASTOMAL INCARCERATED ABDOMINAL WALL HERNIA, CHRONIC RECTOVAGINAL SINUS  The various methods of treatment have been discussed with the patient and family. After consideration of risks, benefits and other options for treatment, the patient has consented to  Procedure(s): LAPAROSCOPIC VENTRAL WALL HERNIA REPAIR (N/A) LYSIS OF ADHESION (N/A) POSSIBLE ANORECTAL EXAM UNDER ANESTHESIA (N/A) INSERTION OF MESH (N/A) as a surgical intervention .  The patient's history has been reviewed, patient examined, no change in status, stable for surgery.  I have reviewed the patient's chart and labs.  Questions were answered to the patient's satisfaction.    I have re-reviewed the the patient's records, history, medications, and allergies.  I have re-examined the patient.  I again discussed intraoperative plans and goals of post-operative recovery.  The patient agrees to proceed.  Lindsay Little  [birth year] [id number]  Patient Care Team: [name], MD as PCP - General (Family Medicine) Jerline Pain, MD as PCP - Cardiology (Cardiology) Michael Boston, MD as Consulting Physician (General Surgery) Truitt Merle, MD as Consulting Physician (Medical Oncology) Kyung Rudd, MD as Consulting Physician (Radiation Oncology) Teena Irani, MD (Inactive) as Consulting Physician (Gastroenterology) Raynelle Bring, MD as Consulting Physician (Urology)  Patient Active Problem List   Diagnosis Date Noted  . Rectal adenocarcinoma s/p LAR resection 05/25/2015 01/31/2015    Priority: High  . Crohn's disease ([place]) 02/07/2015    Priority: Medium  . Morbid obesity with BMI of 40.0-44.9, adult ([facility]) 02/21/2014    Priority: Medium  . Seroma of musculoskeletal structure after non-musculoskeletal system procedure 04/24/2018  . S/P lumbar spinal fusion 02/12/2018  . Genetic testing 01/12/2018  .  Family history of pancreatic cancer   . Family history of skin cancer   . Family history of breast cancer   . Colostomy in place [facility]) 07/12/2016  . Colovaginal fistula s/p omentopexy repair 07/12/2016 07/10/2016  . Rectocutaneous fistula 07/10/2016  . Hydroureter on right 12/20/2015  . Abdominal pain 12/19/2015  . Hypokalemia 11/23/2015  . Colorectal delayed anastomotic leak s/p resection & colostomy 07/12/2016 11/17/2015  . Pelvic abscess s/p drainage & omental pedicle flap 11/17/2015 06/23/2015  . Anemia of chronic disease 06/14/2015  . Hypertension   . IBS (irritable bowel syndrome)   . Depression   . Cavernous hemangioma of liver - segments 5 & 7 02/07/2015  . Osteoarthritis of left knee 02/07/2015  . Pure hypercholesterolemia 02/21/2014  . Family history of ischemic heart disease 02/21/2014    Past Medical History:  Diagnosis Date  . Anxiety   . Chemotherapy-induced neuropathy (HCC)    toes and fingers numbness and tingling  . Colostomy in place [facility]) 07/12/2016   due to anastomosis breakdown w/ colovaginal fistula  . Complication of anesthesia   . Depression   . Family history of adverse reaction to anesthesia    mother-- ponv  . Family history of breast cancer   . Family history of pancreatic cancer   . Family history of skin cancer   . Gastroenteritis 12/20/2015  . GERD (gastroesophageal reflux disease)   . Hiatal hernia   . History of cancer chemotherapy 02-20-2015 to 03-30-2015  . History of cardiac murmur as a child   . History of chemotherapy   . History of chronic gastritis   . History of hypertension    no issues since multiple abdminal sx's and chemo--- no medication since 12/ 2016  .  History of TMJ disorder   . Hypercholesteremia   . Hypertension   . IBS (irritable bowel syndrome)    dx age 89  . Intermittent abdominal pain    post-op multiple abdominal sx's   . Microcytic anemia   . Mild sleep apnea    per pt study 2014  very mild osa , no cpap  recommended, recommeded wt loss and sleep routine  . OA (osteoarthritis)    left knee /  left shoulder  . Obesity   . PONV (postoperative nausea and vomiting)    severe" needs Scopolamine PATCH"   . Portacath in place    right chest  . Rectal adenocarcinoma ([place]) oncologis-  dr Burr Medico-- after radiation/ chemo (ypT1, N0) --  no recurrence per last note 03/ 2018   dx 01-13-2015-- Stage IIIC (T3, N2, M0) post concurrent radiation and chemotherapy 02-20-2015 to 03-30-2015 /  05-25-2015 s/p  LAR w/ RSO (post-op complicated by late abscess and contained anatomotic leak w/ help percutaneous drainage and antibiotics)   . Rotator cuff tear, left   . S/P radiation therapy 02/20/15-03/30/15   colon/rectal  . Vitamin D deficiency   . Wears glasses   . Wears glasses     Past Surgical History:  Procedure Laterality Date  . ABDOMINAL HYSTERECTOMY  1996   uterus and cervix  . BACK SURGERY  02/12/2018   lumbar surgery  . COLON RESECTION N/A 07/12/2016   Procedure: LAPAROSCOPIC LYSIS OF ADHESIONS, OMENTOPEXY, HAND ASSISTED RESECTION OF  COLON, END TO END ANASTOMOSIS, COLOSTOMY;  Surgeon: Michael Boston, MD;  Location: [facility];  Service: General;  Laterality: N/A;  . COMBINED HYSTEROSCOPY DIAGNOSTIC / D&C  x2 1990's  . DIAGNOSTIC LAPAROSCOPY  age 26 and age 89  . EUS N/A 01/18/2015   Procedure: LOWER ENDOSCOPIC ULTRASOUND (EUS);  Surgeon: Arta Silence, MD;  Location: [facility];  Service: Endoscopy;  Laterality: N/A;  . EXCISION OF SKIN TAG  11/17/2015   Procedure: EXCISION OF SKIN TAG;  Surgeon: Michael Boston, MD;  [facility]: [facility];  Service: General;;  . ILEO LOOP COLOSTOMY CLOSURE N/A 11/17/2015   Procedure: LAPAROSCOPIC DIVERTING LOOP ILEOSTOMY  DRAINAGE OF PELVIC ABSCESS;  Surgeon: Michael Boston, MD;  [facility]: [facility];  Service: General;  Laterality: N/A;  . ILEOSTOMY CLOSURE N/A 05/31/2016   Procedure: TAKEDOWN LOOP ILEOSTOMY;  Surgeon: Michael Boston, MD;  [facility]: [facility];  Service: General;  Laterality:  N/A;  . IMPACTION REMOVAL  11/17/2015   Procedure: DISIMPACTION REMOVAL;  Surgeon: Michael Boston, MD;  [facility]: [facility];  Service: General;;  . KNEE ARTHROSCOPY Left 1990's  . KNEE ARTHROSCOPY W/ MENISCECTOMY Left 09/14/2009   and chondroplasty debridement  . LAPAROSCOPIC LYSIS OF ADHESIONS  11/17/2015   Procedure: LAPAROSCOPIC LYSIS OF ADHESIONS;  Surgeon: Michael Boston, MD;  [facility];  Service: General;;  . LUMBAR WOUND DEBRIDEMENT N/A 04/24/2018   Procedure: wound exploration, irrigation and debridement;  Surgeon: Eustace Moore, MD;  Location: [facility];  Service: Neurosurgery;  Laterality: N/A;  wound exploration, irrigation and debridement  . PORT-A-CATH REMOVAL N/A 11/21/2016   Procedure: REMOVAL PORT-A-CATH;  Surgeon: Michael Boston, MD;  Location: [facility];  Service: General;  Laterality: N/A;  . PORTACATH PLACEMENT N/A 05/25/2015   Procedure: INSERTION PORT-A-CATH;  Surgeon: Michael Boston, MD;  [facility];  Service: General;  Laterality: N/A;-remains inplace Right chest.  . ROTATOR CUFF REPAIR Right 2006  . TONSILLECTOMY  age 14  . XI ROBOTIC ASSISTED LOWER ANTERIOR RESECTION  N/A 05/25/2015   Procedure: XI ROBOTIC ASSISTED LOWER ANTERIOR RESECTION, , RIGID PROCTOSCOPY, RIGHT OOPHORECTOMY;  Surgeon: Michael Boston, MD;  Location: [facility];  Service: General;  Laterality: N/A;    Social History   Socioeconomic History  . Marital status: Married    Spouse name: Not on file  . Number of children: Not on file  . Years of education: Not on file  . Highest education level: Not on file  Occupational History  . Not on file  Social Needs  . Financial resource strain: Not on file  . Food insecurity:    Worry: Not on file    Inability: Not on file  . Transportation needs:    Medical: Not on file    Non-medical: Not on file  Tobacco Use  . Smoking status: Former Smoker    Packs/day: 0.25    Years: 7.00    Pack years: 1.75    Types: Cigarettes    Last attempt to  quit: 07/16/1991    Years since quitting: 27.0  . Smokeless tobacco: Never Used  Substance and Sexual Activity  . Alcohol use: No  . Drug use: Not on file  . Sexual activity: Yes    Birth control/protection: Surgical  Lifestyle  . Physical activity:    Days per week: Not on file    Minutes per session: Not on file  . Stress: Not on file  Relationships  . Social connections:    Talks on phone: Not on file    Gets together: Not on file    Attends religious service: Not on file    Active member of club or organization: Not on file    Attends meetings of clubs or organizations: Not on file    Relationship status: Not on file  . Intimate partner violence:    Fear of current or ex partner: Not on file    Emotionally abused: Not on file    Physically abused: Not on file    Forced sexual activity: Not on file  Other Topics Concern  . Not on file  Social History Narrative   Tobacco Use: Cigarettes - Former Smoker   Alcohol: Yes, very rare, liquor.    No recreational drug use   Occupation: Head CMA @ [employer]   Marital Status: Married    Husband: Roselyn Reef Disabled   Children: 2 adopted kids [place]   Religion: [employer] [place]                Family History  Problem Relation Age of Onset  . Coronary artery disease Mother 64  . Hypertension Mother   . Hyperlipidemia Mother   . Diabetes Mellitus I Mother   . Coronary artery disease Father   . Hyperlipidemia Father   . Hypertension Father   . Cancer Sister        skin - non melanoma  . Hyperlipidemia Brother   . Cancer Maternal Uncle 64       pancreatic with mets to colon and prostate  . Cirrhosis Maternal Uncle   . Hypertension Maternal Grandmother   . Diabetes Mellitus I Maternal Grandmother   . Hyperlipidemia Maternal Grandmother   . CVA Maternal Grandmother   . Hypertension Maternal Grandfather   . Coronary artery disease Maternal Grandfather 65  . Hyperlipidemia Maternal Grandfather   .  Coronary artery disease Paternal Grandmother   . Hypertension Paternal Grandmother   . Hyperlipidemia Paternal Grandmother   . Diabetes Mellitus I Paternal  Grandmother   . Hypertension Paternal Grandfather   . Hyperlipidemia Paternal Grandfather   . Coronary artery disease Paternal Grandfather     Medications Prior to Admission  Medication Sig Dispense Refill Last Dose  . Cholecalciferol (VITAMIN D3) 5000 units CAPS Take 5,000 Units by mouth at bedtime.    07/22/2018 at Unknown time  . Coenzyme Q10 (COQ10) 200 MG CAPS Take 200 mg by mouth at bedtime.   07/22/2018 at Unknown time  . DULoxetine (CYMBALTA) 60 MG capsule Take 120 mg by mouth at bedtime.    07/22/2018 at Unknown time  . esomeprazole (NEXIUM) 40 MG capsule Take 1 capsule (40 mg total) by mouth 2 (two) times daily before a meal. 60 capsule 5 07/22/2018 at Unknown time  . furosemide (LASIX) 20 MG tablet Take 20 mg by mouth daily as needed for edema.    Past Month at Unknown time  . gabapentin (NEURONTIN) 300 MG capsule Take 300-600 mg by mouth See admin instructions. Take 600 mg in the morning, 300 mg afternoon and 600 mg at night   07/23/2018 at 0445  . HYDROcodone-acetaminophen (NORCO/VICODIN) 5-325 MG tablet Take 1-2 tablets by mouth every 6 (six) hours as needed for moderate pain. 50 tablet 0 07/22/2018 at Unknown time  . Melatonin 3 MG TABS Take 3-6 mg by mouth at bedtime.    Past Week at Unknown time  . meloxicam (MOBIC) 15 MG tablet Take 15 mg by mouth at bedtime.    07/16/2018 at Unknown time  . Multiple Vitamin (MULTIVITAMIN WITH MINERALS) TABS tablet Take 2 tablets by mouth at bedtime.    07/21/2018  . nebivolol (BYSTOLIC) 5 MG tablet Take 5 mg by mouth at bedtime.    07/22/2018 at 2300  . polyethylene glycol (MIRALAX / GLYCOLAX) packet Take 17 g by mouth daily as needed for moderate constipation.   07/22/2018 at Unknown time  . rosuvastatin (CRESTOR) 5 MG tablet Take 5 mg by mouth at bedtime.   07/22/2018 at Unknown time  . tiZANidine  (ZANAFLEX) 4 MG tablet Take 4 mg by mouth 3 (three) times daily as needed for muscle spasms.   2 07/22/2018 at Unknown time  . triamcinolone lotion (KENALOG) 0.1 % Apply 1 application topically as needed (dry skin).   07/22/2018 at Unknown time  . traMADol (ULTRAM) 50 MG tablet Take 50 mg by mouth every 6 (six) hours as needed for moderate pain.    More than a month at Unknown time    Current Facility-Administered Medications  Medication Dose Route Frequency Provider Last Rate Last Dose  . acetaminophen (TYLENOL) tablet 1,000 mg  1,000 mg Oral On Call to OR Michael Boston, MD      . bupivacaine liposome (EXPAREL) 1.3 % injection 266 mg  20 mL Infiltration Once Michael Boston, MD      . clindamycin (CLEOCIN) 900 MG/50ML IVPB           . clindamycin (CLEOCIN) IVPB 900 mg  900 mg Intravenous On Call to OR Michael Boston, MD       And  . gentamicin (GARAMYCIN) 540 mg in dextrose 5 % 100 mL IVPB  540 mg Intravenous On Call to OR Michael Boston, MD      . lactated ringers infusion   Intravenous Continuous Myrtie Soman, MD 50 mL/hr at 07/23/18 [phone]    . ondansetron (ZOFRAN) 4 MG/2ML injection           . scopolamine (TRANSDERM-SCOP) 1 MG/3DAYS 1.5 mg  1 patch Transdermal  Once Catalina Gravel, MD       Facility-Administered Medications Ordered in Other Encounters  Medication Dose Route Frequency Provider Last Rate Last Dose  . 0.9 %  sodium chloride infusion   Intravenous Once Truitt Merle, MD         Allergies  Allergen Reactions  . Caine-1 [Lidocaine] Swelling and Rash    Eyes swell shut; includes all caine drugs except marcaine. EMLA cream OK though (?!)  . Sulfa Antibiotics Nausea And Vomiting and Rash  . Adhesive [Tape] Other (See Comments)    Blisters - can use paper tape  . Doxycycline Nausea Only  . Flagyl [Metronidazole] Nausea Only  . Iron Nausea Only  . Oxycodone Other (See Comments)    NIGHTMARES. (tolerates hydrocodone or tramadol better)  . Penicillins Rash    Has patient had a  PCN reaction causing immediate rash, facial/tongue/throat swelling, SOB or lightheadedness with hypotension: no Has patient had a PCN reaction causing severe rash involving mucus membranes or skin necrosis: no Has patient had a PCN reaction that required hospitalization no Has patient had a PCN reaction occurring within the last 10 years: no If all of the above answers are "NO", then may proceed with Cephalosporin use.     BP (!) 147/84   Pulse 65   Temp 99.1 F (37.3 C) (Oral)   Resp 16   Ht 5' 10.5" (1.791 m)   Wt (!) 154.6 kg   SpO2 96%   BMI 48.22 kg/m   Labs: Results for orders placed or performed in visit on 07/21/18 (from the past 48 hour(s))  CBC with Differential     Status: Abnormal   Collection Time: 07/21/18  1:43 PM  Result Value Ref Range   WBC 7.7 4.0 - 10.5 K/uL   RBC 4.24 3.87 - 5.11 MIL/uL   Hemoglobin 11.4 (L) 12.0 - 15.0 g/dL   HCT 36.6 36.0 - 46.0 %   MCV 86.3 80.0 - 100.0 fL   MCH 26.9 26.0 - 34.0 pg   MCHC 31.1 30.0 - 36.0 g/dL   RDW 14.4 11.5 - 15.5 %   Platelets 302 150 - 400 K/uL   nRBC 0.0 0.0 - 0.2 %   Neutrophils Relative % 70 %   Neutro Abs 5.5 1.7 - 7.7 K/uL   Lymphocytes Relative 19 %   Lymphs Abs 1.4 0.7 - 4.0 K/uL   Monocytes Relative 7 %   Monocytes Absolute 0.5 0.1 - 1.0 K/uL   Eosinophils Relative 2 %   Eosinophils Absolute 0.1 0.0 - 0.5 K/uL   Basophils Relative 1 %   Basophils Absolute 0.0 0.0 - 0.1 K/uL   Immature Granulocytes 1 %   Abs Immature Granulocytes 0.04 0.00 - 0.07 K/uL    Comment: Performed at [facility], [address] [place], [place] [address]  CMP ([place] only)     Status: Abnormal   Collection Time: 07/21/18  1:43 PM  Result Value Ref Range   Sodium 141 135 - 145 mmol/L   Potassium 4.2 3.5 - 5.1 mmol/L   Chloride 106 98 - 111 mmol/L   CO2 27 22 - 32 mmol/L   Glucose, Bld 104 (H) 70 - 99 mg/dL   BUN 11 6 - 20 mg/dL   Creatinine 0.72 0.44 - 1.00 mg/dL   Calcium 9.9 8.9 -  10.3 mg/dL   Total Protein 7.2 6.5 - 8.1 g/dL   Albumin 3.7 3.5 - 5.0 g/dL   AST  13 (L) 15 - 41 U/L   ALT 18 0 - 44 U/L   Alkaline Phosphatase 100 38 - 126 U/L   Total Bilirubin 0.3 0.3 - 1.2 mg/dL   GFR, Est Non Af Am >60 >60 mL/min   GFR, Est AFR Am >60 >60 mL/min   Anion gap 8 5 - 15    Comment: Performed at [facility], [place] [address] [place], [place] [address]  CEA (IN HOUSE-[facility])     Status: None   Collection Time: 07/21/18  1:48 PM  Result Value Ref Range   CEA ([facility]-In House) 2.04 0.00 - 5.00 ng/mL    Comment: Performed at [facility], [place] [address] [place], [place] [address]    Imaging / Studies: No results found.   [name], M.D., F.A.C.S. Gastrointestinal and Minimally Invasive Surgery [facility] [address], [place], [place] [address] [phone] Main / Paging  07/23/2018 7:31 AM    [name]

## 2018-07-24 ENCOUNTER — Encounter (HOSPITAL_COMMUNITY): Payer: Self-pay | Admitting: Surgery

## 2018-07-24 DIAGNOSIS — G894 Chronic pain syndrome: Secondary | ICD-10-CM

## 2018-07-24 MED ORDER — HYDROCODONE-ACETAMINOPHEN 10-325 MG PO TABS
1.0000 | ORAL_TABLET | ORAL | Status: DC | PRN
  Administered 2018-07-24 – 2018-07-26 (×9): 2 via ORAL
  Filled 2018-07-24 (×9): qty 2

## 2018-07-24 NOTE — Progress Notes (Signed)
Chaplain responding to Spiritual care consult.    Provided support around advance directive.  Pt will look over paperwork and notify chaplain if she would like to have this notarized.   Chaplain provided brief support around hospitalization and patient's values.      Jerene Pitch, MDiv, [facility]

## 2018-07-24 NOTE — Progress Notes (Signed)
Lindsay Little [id number] 26-Jul-1965  CARE TEAM:  PCP: [name], MD  Outpatient Care Team: Patient Care Team: [name], MD as PCP - General (Family Medicine) Jerline Pain, MD as PCP - Cardiology (Cardiology) Michael Boston, MD as Consulting Physician (General Surgery) Truitt Merle, MD as Consulting Physician (Medical Oncology) Kyung Rudd, MD as Consulting Physician (Radiation Oncology) Teena Irani, MD (Inactive) as Consulting Physician (Gastroenterology) Raynelle Bring, MD as Consulting Physician (Urology)  Inpatient Treatment Team: Treatment Team: Attending Provider: Michael Boston, MD; [place] Nurse: Meryle Ready, RN; Registered Nurse: Kerney Elbe, RN; Technician: Sharren Bridge, NT; Registered Nurse: Franki Monte, RN   Problem List:   Principal Problem:   Parastomal hernia Active Problems:   Rectal adenocarcinoma s/p LAR resection 05/25/2015   Morbid obesity with body mass index of 40.0-49.9 (HCC)   Pure hypercholesterolemia   Hypertension   IBS (irritable bowel syndrome)   Depression   Rectocutaneous fistula   Colostomy in place [facility])   Peritoneal-vaginal fistula   Ano-peritoneal fistula   Chronic pain syndrome   1 Day Post-Op  07/23/2018  PROCEDURE:   LAPAROSCOPIC VENTRAL WALL HERNIA REPAIR LYSIS OF ADHESIONS DEBRIDEMENT & A-CELL THERAPY OF CHRONIC ANO-PERITONEAL FISTULA SINUS DEBRIDEMENT & A-CELL THERAPY OF CHRONIC VAGINO-PERITONEAL FISTULA SINUS  SURGEON:  [name], MD     Assessment  OK  [facility] Stay = 1 days)  Plan:  -Advance diet.  Follow-up IV fluids.  Improve pain control.  Restarting chronic pain medications.  Bowel regimen.  Follow blood pressure hypertension control PRN.  Continue subcutaneous drain most likely for the first week.  Most likely patient go home with that.  Wound ostomy consultation to reevaluate colostomy since surface anatomy will change with hernia fixed and now more of a  diverting effect  VTE prophylaxis- SCDs, etc  mobilize as tolerated to help recovery  20 minutes spent in review, evaluation, examination, counseling, and coordination of care.  More than 50% of that time was spent in counseling.  07/24/2018    Subjective: (Chief complaint)  Family bedside.  Sore but did get up.  Tolerating full liquids.  Nursing coming into room  Objective:  Vital signs:  Vitals:   07/23/18 1722 07/23/18 2301 07/24/18 0338 07/24/18 0545  BP: 138/78 131/66 (!) 112/57 124/67  Pulse: 95 (!) 101 81 75  Resp: 18 17 17 17   Temp:  98.2 F (36.8 C) (!) 97.5 F (36.4 C) 98.3 F (36.8 C)  TempSrc:  Oral Oral Oral  SpO2: 90% 91% (!) 87% 95%  Weight:      Height:        Last BM Date: 07/23/18  Intake/Output   Yesterday:  01/09 0701 - 01/10 0700 In: 3293.9 [P.O.:240; I.V.:2903.9; IV Piggyback:150] Out: 5732 [Urine:1260; Drains:395; Blood:50] This shift:  No intake/output data recorded.  Bowel function:  Flatus: YES  BM:  No  Drain: Serosanguinous   Physical Exam:  General: Pt awake/alert/oriented x4 in no acute distress Eyes: PERRL, normal EOM.  Sclera clear.  No icterus Neuro: CN II-XII intact w/o focal sensory/motor deficits. Lymph: No head/neck/groin lymphadenopathy Psych:  No delerium/psychosis/paranoia HENT: Normocephalic, Mucus membranes moist.  No thrush Neck: Supple, No tracheal deviation Chest: No chest wall pain w good excursion CV:  Pulses intact.  Regular rhythm MS: Normal AROM mjr joints.  No obvious deformity  Abdomen: Soft.  Nondistended.  Mildly tender at incisions only.  Colostomy pink.  Minimal gas but no stool in bag.  no evidence  of peritonitis.  No incarcerated hernias.  Ext:  No deformity.  No mjr edema.  No cyanosis Skin: No petechiae / purpura  Results:   Labs: No results found for this or any previous visit (from the past 48 hour(s)).  Imaging / Studies: No results found.  Medications / Allergies: per  chart  Antibiotics: Anti-infectives (From admission, onward)   Start     Dose/Rate Route Frequency Ordered Stop   07/24/18 0600  clindamycin (CLEOCIN) IVPB 600 mg  Status:  Discontinued    Note to Pharmacy:  Pharmacy may adjust dosing strength, interval, or rate of medication as needed for optimal therapy for the patient Send with patient on call to the OR.  Anesthesia to complete antibiotic administration <34mn [facility]   600 mg 100 mL/hr over 30 Minutes Intravenous On call to O.R. 07/23/18 1506 07/23/18 1518   07/23/18 1258  clindamycin (CLEOCIN) 900 mg, gentamicin (GARAMYCIN) 240 mg in sodium chloride 0.9 % 1,000 mL for intraperitoneal lavage  Status:  Discontinued       As needed 07/23/18 1258 07/23/18 1322   07/23/18 0900  clindamycin (CLEOCIN) 900 mg, gentamicin (GARAMYCIN) 240 mg in sodium chloride 0.9 % 1,000 mL for intraperitoneal lavage      Intraperitoneal To Surgery 07/23/18 0851 07/24/18 0900   07/23/18 0730  clindamycin (CLEOCIN) IVPB 900 mg     900 mg 100 mL/hr over 30 Minutes Intravenous On call to O.R. 07/23/18 0240901/09/20 0805   07/23/18 0730  gentamicin (GARAMYCIN) 540 mg in dextrose 5 % 100 mL IVPB     540 mg 113.5 mL/hr over 60 Minutes Intravenous On call to O.R. 07/23/18 0735301/09/20 0834   07/23/18 0727  clindamycin (CLEOCIN) 900 MG/50ML IVPB    Note to Pharmacy:  CDellie Catholic  : cabinet override      07/23/18 0727 07/23/18 0745        Note: Portions of this report may have been transcribed using voice recognition software. Every effort was made to ensure accuracy; however, inadvertent computerized transcription errors may be present.   Any transcriptional errors that result from this process are unintentional.     SAdin Hector MD, FACS, MASCRS Gastrointestinal and Minimally Invasive Surgery    [address] [place] [phone] / Paging [phone]

## 2018-07-24 NOTE — Consult Note (Signed)
[place] Nurse ostomy follow up Patient receiving care in [place].  Sister present. Stoma type/location: LUQ colostomy Stomal assessment/size: 1 inch top to bottom, 1.5 inches side to side. Barely above skin level.  Currently not in an abdominal fold or crease. Peristomal assessment: intact Treatment options for stomal/peristomal skin: barrier ring Output: none  Ostomy pouching: 1pc., 2 inch convex.  Supply of pouches, barrier rings, and an ostomy belt placed at bedside.  Education provided: How to use an ostomy belt. Val Riles, RN, MSN, CWOCN, CNS-BC, pager [phone]

## 2018-07-25 MED ORDER — ALUM & MAG HYDROXIDE-SIMETH 200-200-20 MG/5ML PO SUSP: 30.0000 mL | Freq: Four times a day (QID) | ORAL | Status: DC | PRN

## 2018-07-25 MED ORDER — TIZANIDINE HCL 4 MG PO TABS
4.0000 mg | ORAL_TABLET | Freq: Three times a day (TID) | ORAL | Status: DC
  Administered 2018-07-25 – 2018-07-26 (×4): 4 mg via ORAL
  Filled 2018-07-25 (×4): qty 1

## 2018-07-25 MED ORDER — GABAPENTIN 300 MG PO CAPS
900.0000 mg | ORAL_CAPSULE | Freq: Three times a day (TID) | ORAL | Status: DC
  Administered 2018-07-25 – 2018-07-26 (×4): 900 mg via ORAL
  Filled 2018-07-25 (×5): qty 3

## 2018-07-25 MED ORDER — ACETAMINOPHEN 500 MG PO TABS
500.0000 mg | ORAL_TABLET | Freq: Three times a day (TID) | ORAL | Status: DC
  Administered 2018-07-25 – 2018-07-26 (×3): 500 mg via ORAL
  Filled 2018-07-25 (×3): qty 1

## 2018-07-25 NOTE — Progress Notes (Signed)
Lindsay Little [id number] 01/11/1966  CARE TEAM:  PCP: [name], MD  Outpatient Care Team: Patient Care Team: [name], MD as PCP - General (Family Medicine) Jerline Pain, MD as PCP - Cardiology (Cardiology) Michael Boston, MD as Consulting Physician (General Surgery) Truitt Merle, MD as Consulting Physician (Medical Oncology) Kyung Rudd, MD as Consulting Physician (Radiation Oncology) Teena Irani, MD (Inactive) as Consulting Physician (Gastroenterology) Raynelle Bring, MD as Consulting Physician (Urology)  Inpatient Treatment Team: Treatment Team: Attending Provider: Michael Boston, MD; [place] Nurse: Meryle Ready, RN; Registered Nurse: Kerney Elbe, RN; Technician: Sharren Bridge, NT; Registered Nurse: Franki Monte, RN   Problem List:   Principal Problem:   Parastomal hernia s/p lap repair w mesh 07/23/2018 Active Problems:   Rectal adenocarcinoma s/p LAR resection 05/25/2015   Morbid obesity with body mass index of 40.0-49.9 (HCC)   Chronic pain syndrome   Pure hypercholesterolemia   Hypertension   IBS (irritable bowel syndrome)   Depression   Rectocutaneous fistula   Colostomy in place [facility])   Peritoneal-vaginal fistula   Ano-peritoneal fistula   2 Days Post-Op  07/23/2018  PROCEDURE:   LAPAROSCOPIC VENTRAL WALL HERNIA REPAIR LYSIS OF ADHESIONS DEBRIDEMENT & A-CELL THERAPY OF CHRONIC ANO-PERITONEAL FISTULA SINUS DEBRIDEMENT & A-CELL THERAPY OF CHRONIC VAGINO-PERITONEAL FISTULA SINUS  SURGEON:  [name], MD     Assessment  Improving but with fair pain control  [facility] Stay = 2 days)  Plan:  Solid diet.  Follow-off IV fluids.  Improve pain control.  Restarting chronic pain medications.  Increase gabapentin.  Make her usual muscle relaxant scheduled.  Bowel regimen.  Follow blood pressure / hypertension control PRN.  Continue subcutaneous drain most likely for the first week.  Most likely patient go home with  that.  Wound ostomy consultation to reevaluate colostomy since surface anatomy will change with hernia fixed and now more of a diverting effect  VTE prophylaxis- SCDs, etc  mobilize as tolerated to help recovery  20 minutes spent in review, evaluation, examination, counseling, and coordination of care.  More than 50% of that time was spent in counseling.  07/25/2018    Subjective: (Chief complaint)  Passing gas but no stool.  Walking hallways but significant pain with that.  Needing IV medication.  No nausea or vomiting.  Trying solid food  Objective:  Vital signs:  Vitals:   07/24/18 0545 07/24/18 1019 07/24/18 2143 07/25/18 0449  BP: 124/67 122/69 132/73 110/87  Pulse: 75 72 81 67  Resp: 17 18 16 16   Temp: 98.3 F (36.8 C) 98.6 F (37 C) 98.1 F (36.7 C) 97.6 F (36.4 C)  TempSrc: Oral Oral Oral Oral  SpO2: 95% 99% 95% 96%  Weight:      Height:        Last BM Date: 07/23/18(am)  Intake/Output   Yesterday:  01/10 0701 - 01/11 0700 In: 1210 [P.O.:1210] Out: 1430 [Urine:1350; Drains:80] This shift:  No intake/output data recorded.  Bowel function:  Flatus: YES  BM:  No  Drain: Serosanguinous   Physical Exam:  General: Pt awake/alert/oriented x4 in no acute distress Eyes: PERRL, normal EOM.  Sclera clear.  No icterus Neuro: CN II-XII intact w/o focal sensory/motor deficits. Lymph: No head/neck/groin lymphadenopathy Psych:  No delerium/psychosis/paranoia HENT: Normocephalic, Mucus membranes moist.  No thrush Neck: Supple, No tracheal deviation Chest: No chest wall pain w good excursion CV:  Pulses intact.  Regular rhythm MS: Normal AROM mjr joints.  No obvious  deformity  Abdomen: Soft.  Nondistended.  Mildly tender at incisions only.  Colostomy pink.  Minimal gas but no stool in bag.  no evidence of peritonitis.  No incarcerated hernias.  Ext:  No deformity.  No mjr edema.  No cyanosis Skin: No petechiae / purpura  Results:   Labs: No  results found for this or any previous visit (from the past 48 hour(s)).  Imaging / Studies: No results found.  Medications / Allergies: per chart  Antibiotics: Anti-infectives (From admission, onward)   Start     Dose/Rate Route Frequency Ordered Stop   07/24/18 0600  clindamycin (CLEOCIN) IVPB 600 mg  Status:  Discontinued    Note to Pharmacy:  Pharmacy may adjust dosing strength, interval, or rate of medication as needed for optimal therapy for the patient Send with patient on call to the OR.  Anesthesia to complete antibiotic administration <22mn [facility]   600 mg 100 mL/hr over 30 Minutes Intravenous On call to O.R. 07/23/18 1506 07/23/18 1518   07/23/18 1258  clindamycin (CLEOCIN) 900 mg, gentamicin (GARAMYCIN) 240 mg in sodium chloride 0.9 % 1,000 mL for intraperitoneal lavage  Status:  Discontinued       As needed 07/23/18 1258 07/23/18 1322   07/23/18 0900  clindamycin (CLEOCIN) 900 mg, gentamicin (GARAMYCIN) 240 mg in sodium chloride 0.9 % 1,000 mL for intraperitoneal lavage      Intraperitoneal To Surgery 07/23/18 0851 07/24/18 0900   07/23/18 0730  clindamycin (CLEOCIN) IVPB 900 mg     900 mg 100 mL/hr over 30 Minutes Intravenous On call to O.R. 07/23/18 0948001/09/20 0805   07/23/18 0730  gentamicin (GARAMYCIN) 540 mg in dextrose 5 % 100 mL IVPB     540 mg 113.5 mL/hr over 60 Minutes Intravenous On call to O.R. 07/23/18 [id number]/09/20 0834   07/23/18 0727  clindamycin (CLEOCIN) 900 MG/50ML IVPB    Note to Pharmacy:  CDellie Catholic  : cabinet override      07/23/18 0727 07/23/18 0745        Note: Portions of this report may have been transcribed using voice recognition software. Every effort was made to ensure accuracy; however, inadvertent computerized transcription errors may be present.   Any transcriptional errors that result from this process are unintentional.     SAdin Hector MD, FACS, MASCRS Gastrointestinal and Minimally  Invasive Surgery    [address] [place] [phone] / Paging ([phone]

## 2018-07-25 NOTE — Progress Notes (Signed)
I have reviewed and concur with this student's documentation.   

## 2018-07-26 MED ORDER — HYDROCODONE-ACETAMINOPHEN 10-325 MG PO TABS: 1.0000 | ORAL_TABLET | Freq: Four times a day (QID) | ORAL | 0 refills | Status: DC | PRN

## 2018-07-26 NOTE — Progress Notes (Signed)
Assessment unchanged. Pt verbalized understanding of dc instructions through teach back including follow up care, meds to resume, JP drain care, and when to call to doctor. Discharged via wc to front entrance accompanied by NT and sister.

## 2018-07-26 NOTE — Discharge Summary (Signed)
Physician Discharge Summary    [name] MRN: [id number] DOB/AGE: [birth year]  53 y.o.  Patient Care Team: [name], MD as PCP - General (Family Medicine) Jerline Pain, MD as PCP - Cardiology (Cardiology) Michael Boston, MD as Consulting Physician (General Surgery) Truitt Merle, MD as Consulting Physician (Medical Oncology) Kyung Rudd, MD as Consulting Physician (Radiation Oncology) Teena Irani, MD (Inactive) as Consulting Physician (Gastroenterology) Raynelle Bring, MD as Consulting Physician (Urology)  Admit date: 07/23/2018  Discharge date: 07/26/2018  Hospital Stay = 3 days    Discharge Diagnoses:  Principal Problem:   Parastomal hernia s/p lap repair w mesh 07/23/2018 Active Problems:   Rectal adenocarcinoma s/p LAR resection 05/25/2015   Morbid obesity with body mass index of 40.0-49.9 (HCC)   Chronic pain syndrome   Pure hypercholesterolemia   Hypertension   IBS (irritable bowel syndrome)   Depression   Rectocutaneous fistula   Colostomy in place [facility])   Peritoneal-vaginal fistula   Ano-peritoneal fistula   3 Days Post-Op  07/23/2018  POST-OPERATIVE DIAGNOSIS:   PARASTOMAL INCARCERATED ABDOMINAL WALL HERNIA CHRONIC ANO-PERITONEAL FISTULA SINUS CHRONIC VAGINO-PERITONEAL FISTULA SINUS  PROCEDURE:   LAPAROSCOPIC VENTRAL WALL HERNIA REPAIR LYSIS OF ADHESIONS DEBRIDEMENT & A-CELL THERAPY OF CHRONIC ANO-PERITONEAL FISTULA SINUS DEBRIDEMENT & A-CELL THERAPY OF CHRONIC VAGINO-PERITONEAL FISTULA SINUS  SURGEON:  [name], MD  Consults: None  Hospital Course:   Patient with prior abdominal surgeries for very low rectal cancer ultimately ending up in permanent end colostomy.  Chronic vaginal peritoneal and rectal peritoneal sinus tract fistulas for over a year despite prior interventions.  Developed parastomal hernia.  She underwent surgery on the day admission to help debride and close the vaginal and rectal stump perineal sinus  tracts with ACell treatment as well as Sugarbaker type laparoscopic parastomal hernia repair with mesh.  The patient underwent the surgery above.  Postoperatively, the patient gradually mobilized and advanced to a solid diet.  Pain and other symptoms were treated aggressively.    By the time of discharge, the patient was walking well the hallways, eating food, having flatus.  Pain was well-controlled on an oral medications.  Based on meeting discharge criteria and continuing to recover, I felt it was safe for the patient to be discharged from the hospital to further recover with close followup. Postoperative recommendations were discussed in detail.  They are written as well.  Discharged Condition: good  Discharge Exam: Blood pressure (!) 114/56, pulse 67, temperature 97.8 F (36.6 C), temperature source Oral, resp. rate 16, height 5' 10.5" (1.791 m), weight (!) 154.6 kg, SpO2 93 %.  General: Pt awake/alert/oriented x4 in No acute distress.  Well dressed.  Well-groomed.  Sitting comfortably in chair.  Talking.  Not toxic.  Not sickly. Eyes: PERRL, normal EOM.  Sclera clear.  No icterus Neuro: CN II-XII intact w/o focal sensory/motor deficits. Lymph: No head/neck/groin lymphadenopathy Psych:  No delerium/psychosis/paranoia HENT: Normocephalic, Mucus membranes moist.  No thrush Neck: Supple, No tracheal deviation Chest: No chest wall pain w good excursion CV:  Pulses intact.  Regular rhythm MS: Normal AROM mjr joints.  No obvious deformity Abdomen: Soft.  Nondistended.  Mildly tender at incisions only.  Colostomy pink with gas in bag.  No evidence of peritonitis.  No incarcerated hernias. Ext:  SCDs BLE.  No mjr edema.  No cyanosis Skin: No petechiae / purpura   Disposition:   Follow-up [facility]. Schedule an appointment as soon as possible  for a visit on 07/30/2018.   Specialty:  General Surgery Why:  Come in office for nurse- only visit to remove  surgical drain next week.  Call Mon 1/13 to set up appointment Contact information: [address] [place] [phone]       Michael Boston, MD. Schedule an appointment as soon as possible for a visit in 3 week(s).   Specialty:  General Surgery Contact information: [place] [address] [phone]           Discharge disposition: 01-Home or Self Care       Discharge Instructions    Call MD for:   Complete by:  As directed    FEVER > 101.5 F  (temperatures < 101.5 F are not significant)   Call MD for:  extreme fatigue   Complete by:  As directed    Call MD for:  persistant dizziness or light-headedness   Complete by:  As directed    Call MD for:  persistant nausea and vomiting   Complete by:  As directed    Call MD for:  redness, tenderness, or signs of infection (pain, swelling, redness, odor or green/yellow discharge around incision site)   Complete by:  As directed    Call MD for:  severe uncontrolled pain   Complete by:  As directed    Diet - low sodium heart healthy   Complete by:  As directed    Start with a bland diet such as soups, liquids, starchy foods, low fat foods, etc. the first few days at home. Gradually advance to a solid, low-fat, high fiber diet by the end of the first week at home.   Add a fiber supplement to your diet (Metamucil, etc) If you feel full, bloated, or constipated, stay on a full liquid or pureed/blenderized diet for a few days until you feel better and are no longer constipated.   Discharge instructions   Complete by:  As directed    See Discharge Instructions If you are not getting better after two weeks or are noticing you are getting worse, contact our office [phone] for further advice.  We may need to adjust your medications, re-evaluate you in the office, send you to the emergency room, or see what other things we can do to help. The clinic staff  is available to answer your questions during regular business hours (8:30am-5pm).  Please don't hesitate to call and ask to speak to one of our nurses for clinical concerns.    A surgeon from [facility] is always on call at the hospitals 24 hours/day If you have a medical emergency, go to the nearest emergency room or call 911.   Discharge wound care:   Complete by:  As directed    It is good for closed incisions and even open wounds to be washed every day.  Shower every day.  Short baths are fine.  Wash the incisions and wounds clean with soap & water.     You may leave closed incisions open to air if it is dry.   You may cover the incision with clean gauze & replace it after your daily shower for comfort.   If you have skin tapes (Steristrips) or skin glue (Dermabond) on your incision, leave them in place.  They will fall off on their own like a scab.  You may trim any edges that curl up with clean scissors.  If you have skin staples, set up an appointment for them to be removed in the office in 10 days after surgery.  If you have a drain, wash around the skin exit site with soap & water and place a new dressing of gauze or band aid around the skin every day.  Keep the drain site clean & dry.   Driving Restrictions   Complete by:  As directed    You may drive when: - you are no longer taking narcotic prescription pain medication - you can comfortably wear a seatbelt - you can safely make sudden turns/stops without pain.   Increase activity slowly   Complete by:  As directed    Start light daily activities --- self-care, walking, climbing stairs- beginning the day after surgery.  Gradually increase activities as tolerated.  Control your pain to be active.  Stop when you are tired.  Ideally, walk several times a day, eventually an hour a day.   Most people are back to most day-to-day activities in a few weeks.  It takes 4-6 weeks to get back to unrestricted, intense  activity. If you can walk 30 minutes without difficulty, it is safe to try more intense activity such as jogging, treadmill, bicycling, low-impact aerobics, swimming, etc. Save the most intensive and strenuous activity for last (Usually 4-8 weeks after surgery) such as sit-ups, heavy lifting, contact sports, etc.  Refrain from any intense heavy lifting or straining until you are off narcotics for pain control.  You will have off days, but things should improve week-by-week. DO NOT PUSH THROUGH PAIN.  Let pain be your guide: If it hurts to do something, don't do it.   Lifting restrictions   Complete by:  As directed    If you can walk 30 minutes without difficulty, it is safe to try more intense activity such as jogging, treadmill, bicycling, low-impact aerobics, swimming, etc. Save the most intensive and strenuous activity for last (Usually 4-8 weeks after surgery) such as sit-ups, heavy lifting, contact sports, etc.   Refrain from any intense heavy lifting or straining until you are off narcotics for pain control.  You will have off days, but things should improve week-by-week. DO NOT PUSH THROUGH PAIN.  Let pain be your guide: If it hurts to do something, don't do it.  Pain is your body warning you to avoid that activity for another week until the pain goes down.   May shower / Bathe   Complete by:  As directed    May walk up steps   Complete by:  As directed    Sexual Activity Restrictions   Complete by:  As directed    You may have sexual intercourse when it is comfortable. If it hurts to do something, stop.      Allergies as of 07/26/2018      Reactions   Caine-1 [lidocaine] Swelling, Rash   Eyes swell shut; includes all caine drugs except marcaine. EMLA cream OK though (?!)   Sulfa Antibiotics Nausea And Vomiting, Rash   Adhesive [tape] Other (See Comments)   Blisters - can use paper tape   Doxycycline Nausea Only   Flagyl [metronidazole] Nausea Only   Iron Nausea Only   Oxycodone  Other (See Comments)   NIGHTMARES. (tolerates hydrocodone or tramadol better)   Penicillins Rash   Has patient had a PCN reaction causing immediate rash, facial/tongue/throat swelling, SOB or lightheadedness with hypotension: no Has patient had a PCN reaction causing severe rash involving mucus  membranes or skin necrosis: no Has patient had a PCN reaction that required hospitalization no Has patient had a PCN reaction occurring within the last 10 years: no If all of the above answers are "NO", then may proceed with Cephalosporin use.      Medication List    STOP taking these medications   HYDROcodone-acetaminophen 5-325 MG tablet Commonly known as:  NORCO/VICODIN Replaced by:  HYDROcodone-acetaminophen 10-325 MG tablet   traMADol 50 MG tablet Commonly known as:  ULTRAM     TAKE these medications   CoQ10 200 MG Caps Take 200 mg by mouth at bedtime.   DULoxetine 60 MG capsule Commonly known as:  CYMBALTA Take 120 mg by mouth at bedtime.   esomeprazole 40 MG capsule Commonly known as:  NEXIUM Take 1 capsule (40 mg total) by mouth 2 (two) times daily before a meal.   furosemide 20 MG tablet Commonly known as:  LASIX Take 20 mg by mouth daily as needed for edema.   gabapentin 300 MG capsule Commonly known as:  NEURONTIN Take 300-600 mg by mouth See admin instructions. Take 600 mg in the morning, 300 mg afternoon and 600 mg at night   HYDROcodone-acetaminophen 10-325 MG tablet Commonly known as:  NORCO Take 1-2 tablets by mouth every 6 (six) hours as needed for moderate pain or severe pain. Replaces:  HYDROcodone-acetaminophen 5-325 MG tablet   Melatonin 3 MG Tabs Take 3-6 mg by mouth at bedtime.   meloxicam 15 MG tablet Commonly known as:  MOBIC Take 15 mg by mouth at bedtime.   multivitamin with minerals Tabs tablet Take 2 tablets by mouth at bedtime.   nebivolol 5 MG tablet Commonly known as:  BYSTOLIC Take 5 mg by mouth at bedtime.   polyethylene glycol  packet Commonly known as:  MIRALAX / GLYCOLAX Take 17 g by mouth daily as needed for moderate constipation.   rosuvastatin 5 MG tablet Commonly known as:  CRESTOR Take 5 mg by mouth at bedtime.   tiZANidine 4 MG tablet Commonly known as:  ZANAFLEX Take 4 mg by mouth 3 (three) times daily as needed for muscle spasms.   triamcinolone lotion 0.1 % Commonly known as:  KENALOG Apply 1 application topically as needed (dry skin).   Vitamin D3 125 MCG (5000 UT) Caps Take 5,000 Units by mouth at bedtime.            Discharge Care Instructions  (From admission, onward)         Start     Ordered   07/26/18 0000  Discharge wound care:    Comments:  It is good for closed incisions and even open wounds to be washed every day.  Shower every day.  Short baths are fine.  Wash the incisions and wounds clean with soap & water.     You may leave closed incisions open to air if it is dry.   You may cover the incision with clean gauze & replace it after your daily shower for comfort.   If you have skin tapes (Steristrips) or skin glue (Dermabond) on your incision, leave them in place.  They will fall off on their own like a scab.  You may trim any edges that curl up with clean scissors.    If you have skin staples, set up an appointment for them to be removed in the office in 10 days after surgery.  If you have a drain, wash around the skin exit site with soap & water  and place a new dressing of gauze or band aid around the skin every day.  Keep the drain site clean & dry.   07/26/18 0814          Significant Diagnostic Studies:  No results found for this or any previous visit (from the past 72 hour(s)).  No results found.  Past Medical History:  Diagnosis Date  . Anxiety   . Chemotherapy-induced neuropathy (HCC)    toes and fingers numbness and tingling  . Colorectal delayed anastomotic leak s/p resection & colostomy 07/12/2016 11/17/2015  . Colostomy in place [facility]) 07/12/2016    due to anastomosis breakdown w/ colovaginal fistula  . Colovaginal fistula s/p omentopexy repair 07/12/2016 07/10/2016  . Complication of anesthesia   . Depression   . Family history of adverse reaction to anesthesia    mother-- ponv  . Family history of breast cancer   . Family history of pancreatic cancer   . Family history of skin cancer   . Gastroenteritis 12/20/2015  . GERD (gastroesophageal reflux disease)   . Hiatal hernia   . History of cancer chemotherapy 02-20-2015 to 03-30-2015  . History of cardiac murmur as a child   . History of chemotherapy   . History of chronic gastritis   . History of hypertension    no issues since multiple abdminal sx's and chemo--- no medication since 12/ 2016  . History of TMJ disorder   . Hypercholesteremia   . Hypertension   . IBS (irritable bowel syndrome)    dx age 23  . Intermittent abdominal pain    post-op multiple abdominal sx's   . Microcytic anemia   . Mild sleep apnea    per pt study 2014  very mild osa , no cpap recommended, recommeded wt loss and sleep routine  . OA (osteoarthritis)    left knee /  left shoulder  . Obesity   . PONV (postoperative nausea and vomiting)    severe" needs Scopolamine PATCH"   . Portacath in place    right chest  . Rectal adenocarcinoma ([place]) oncologis-  dr Burr Medico-- after radiation/ chemo (ypT1, N0) --  no recurrence per last note 03/ 2018   dx 01-13-2015-- Stage IIIC (T3, N2, M0) post concurrent radiation and chemotherapy 02-20-2015 to 03-30-2015 /  05-25-2015 s/p  LAR w/ RSO (post-op complicated by late abscess and contained anatomotic leak w/ help percutaneous drainage and antibiotics)   . Rotator cuff tear, left   . S/P radiation therapy 02/20/15-03/30/15   colon/rectal  . Vitamin D deficiency   . Wears glasses   . Wears glasses     Past Surgical History:  Procedure Laterality Date  . ABDOMINAL HYSTERECTOMY  1996   uterus and cervix  . BACK SURGERY  02/12/2018   lumbar surgery  . COLON  RESECTION N/A 07/12/2016   Procedure: LAPAROSCOPIC LYSIS OF ADHESIONS, OMENTOPEXY, HAND ASSISTED RESECTION OF  COLON, END TO END ANASTOMOSIS, COLOSTOMY;  Surgeon: Michael Boston, MD;  Location: [facility];  Service: General;  Laterality: N/A;  . COMBINED HYSTEROSCOPY DIAGNOSTIC / D&C  x2 1990's  . DIAGNOSTIC LAPAROSCOPY  age 26 and age 62  . EUS N/A 01/18/2015   Procedure: LOWER ENDOSCOPIC ULTRASOUND (EUS);  Surgeon: Arta Silence, MD;  Location: [facility];  Service: Endoscopy;  Laterality: N/A;  . EXCISION OF SKIN TAG  11/17/2015   Procedure: EXCISION OF SKIN TAG;  Surgeon: Michael Boston, MD;  Location: [facility];  Service: General;;  . ILEO LOOP COLOSTOMY CLOSURE N/A  11/17/2015   Procedure: LAPAROSCOPIC DIVERTING LOOP ILEOSTOMY  DRAINAGE OF PELVIC ABSCESS;  Surgeon: Michael Boston, MD;  Location: [facility];  Service: General;  Laterality: N/A;  . ILEOSTOMY CLOSURE N/A 05/31/2016   Procedure: TAKEDOWN LOOP ILEOSTOMY;  Surgeon: Michael Boston, MD;  Location: [facility];  Service: General;  Laterality: N/A;  . IMPACTION REMOVAL  11/17/2015   Procedure: DISIMPACTION REMOVAL;  Surgeon: Michael Boston, MD;  Location: [facility];  Service: General;;  . INSERTION OF MESH N/A 07/23/2018   Procedure: INSERTION OF MESH X2;  Surgeon: Michael Boston, MD;  Location: [facility];  Service: General;  Laterality: N/A;  . KNEE ARTHROSCOPY Left 1990's  . KNEE ARTHROSCOPY W/ MENISCECTOMY Left 09/14/2009   and chondroplasty debridement  . LAPAROSCOPIC LYSIS OF ADHESIONS  11/17/2015   Procedure: LAPAROSCOPIC LYSIS OF ADHESIONS;  Surgeon: Michael Boston, MD;  Location: [facility];  Service: General;;  . LUMBAR WOUND DEBRIDEMENT N/A 04/24/2018   Procedure: wound exploration, irrigation and debridement;  Surgeon: Eustace Moore, MD;  Location: [facility];  Service: Neurosurgery;  Laterality: N/A;  wound exploration, irrigation and debridement  . LYSIS OF ADHESION N/A 07/23/2018   Procedure: LYSIS OF ADHESIONS;  Surgeon: Michael Boston, MD;  [facility];  Service:  General;  Laterality: N/A;  . PORT-A-CATH REMOVAL N/A 11/21/2016   Procedure: REMOVAL PORT-A-CATH;  Surgeon: Michael Boston, MD;  Location: [facility];  Service: General;  Laterality: N/A;  . PORTACATH PLACEMENT N/A 05/25/2015   Procedure: INSERTION PORT-A-CATH;  Surgeon: Michael Boston, MD;  Location: [facility];  Service: General;  Laterality: N/A;-remains inplace Right chest.  . ROTATOR CUFF REPAIR Right 2006  . TONSILLECTOMY  age 62  . VENTRAL HERNIA REPAIR N/A 07/23/2018   Procedure: LAPAROSCOPIC VENTRAL WALL HERNIA REPAIR;  Surgeon: Michael Boston, MD;  [facility]: [facility];  Service: General;  Laterality: N/A;  . XI ROBOTIC ASSISTED LOWER ANTERIOR RESECTION N/A 05/25/2015   Procedure: XI ROBOTIC ASSISTED LOWER ANTERIOR RESECTION, , RIGID PROCTOSCOPY, RIGHT OOPHORECTOMY;  Surgeon: Michael Boston, MD;  Location: [facility];  Service: General;  Laterality: N/A;    Social History   Socioeconomic History  . Marital status: Married    Spouse name: Not on file  . Number of children: Not on file  . Years of education: Not on file  . Highest education level: Not on file  Occupational History  . Not on file  Social Needs  . Financial resource strain: Not on file  . Food insecurity:    Worry: Not on file    Inability: Not on file  . Transportation needs:    Medical: Not on file    Non-medical: Not on file  Tobacco Use  . Smoking status: Former Smoker    Packs/day: 0.25    Years: 7.00    Pack years: 1.75    Types: Cigarettes    Last attempt to quit: 07/16/1991    Years since quitting: 27.0  . Smokeless tobacco: Never Used  Substance and Sexual Activity  . Alcohol use: No  . Drug use: Not on file  . Sexual activity: Yes    Birth control/protection: Surgical  Lifestyle  . Physical activity:    Days per week: Not on file    Minutes per session: Not on file  . Stress: Not on file  Relationships  . Social connections:    Talks on phone: Not on file    Gets together: Not on file     Attends religious service: Not on  file    Active member of club or organization: Not on file    Attends meetings of clubs or organizations: Not on file    Relationship status: Not on file  . Intimate partner violence:    Fear of current or ex partner: Not on file    Emotionally abused: Not on file    Physically abused: Not on file    Forced sexual activity: Not on file  Other Topics Concern  . Not on file  Social History Narrative   Tobacco Use: Cigarettes - Former Smoker   Alcohol: Yes, very rare, liquor.    No recreational drug use   Occupation: Head CMA @ [employer]   Marital Status: Married    Husband: Roselyn Reef Disabled   Children: 2 adopted kids [place]   Religion: [employer] [place]                Family History  Problem Relation Age of Onset  . Coronary artery disease Mother 63  . Hypertension Mother   . Hyperlipidemia Mother   . Diabetes Mellitus I Mother   . Coronary artery disease Father   . Hyperlipidemia Father   . Hypertension Father   . Cancer Sister        skin - non melanoma  . Hyperlipidemia Brother   . Cancer Maternal Uncle 66       pancreatic with mets to colon and prostate  . Cirrhosis Maternal Uncle   . Hypertension Maternal Grandmother   . Diabetes Mellitus I Maternal Grandmother   . Hyperlipidemia Maternal Grandmother   . CVA Maternal Grandmother   . Hypertension Maternal Grandfather   . Coronary artery disease Maternal Grandfather 65  . Hyperlipidemia Maternal Grandfather   . Coronary artery disease Paternal Grandmother   . Hypertension Paternal Grandmother   . Hyperlipidemia Paternal Grandmother   . Diabetes Mellitus I Paternal Grandmother   . Hypertension Paternal Grandfather   . Hyperlipidemia Paternal Grandfather   . Coronary artery disease Paternal Grandfather     Current Facility-Administered Medications  Medication Dose Route Frequency Provider Last Rate Last Dose  . acetaminophen (TYLENOL) tablet 500 mg  500  mg Oral Tor Netters, MD   500 mg at 07/25/18 2353  . alum & mag hydroxide-simeth (MAALOX/MYLANTA) 200-200-20 MG/5ML suspension 30 mL  30 mL Oral Q6H PRN Michael Boston, MD      . bisacodyl (DULCOLAX) suppository 10 mg  10 mg Rectal Daily PRN Michael Boston, MD      . cholecalciferol (VITAMIN D3) tablet 5,000 Units  5,000 Units Oral Ardeen Fillers, MD   5,000 Units at 07/25/18 2108  . diphenhydrAMINE (BENADRYL) 12.5 MG/5ML elixir 12.5 mg  12.5 mg Oral Q6H PRN Michael Boston, MD       Or  . diphenhydrAMINE (BENADRYL) injection 12.5 mg  12.5 mg Intravenous Q6H PRN Michael Boston, MD      . DULoxetine (CYMBALTA) DR capsule 120 mg  120 mg Oral Ardeen Fillers, MD   120 mg at 07/25/18 2108  . enoxaparin (LOVENOX) injection 40 mg  40 mg Subcutaneous Q24H Michael Boston, MD   40 mg at 07/25/18 0818  . gabapentin (NEURONTIN) capsule 900 mg  900 mg Oral TID Michael Boston, MD   900 mg at 07/25/18 2108  . guaiFENesin-dextromethorphan (ROBITUSSIN DM) 100-10 MG/5ML syrup 10 mL  10 mL Oral Q4H PRN Michael Boston, MD      . hydrALAZINE (APRESOLINE) injection 5-20 mg  5-20 mg  Intravenous Q4H PRN Michael Boston, MD      . HYDROcodone-acetaminophen [facility]) 10-325 MG per tablet 1-2 tablet  1-2 tablet Oral Q4H PRN Michael Boston, MD   2 tablet at 07/26/18 0554  . hydrocortisone (ANUSOL-HC) 2.5 % rectal cream 1 application  1 application Topical QID PRN Michael Boston, MD      . hydrocortisone cream 1 % 1 application  1 application Topical TID PRN Michael Boston, MD      . HYDROmorphone (DILAUDID) injection 0.5-2 mg  0.5-2 mg Intravenous Q2H PRN Michael Boston, MD   1 mg at 07/25/18 2353  . lactated ringers bolus 1,000 mL  1,000 mL Intravenous TID PRN Michael Boston, MD      . lip balm (CARMEX) ointment 1 application  1 application Topical BID Michael Boston, MD   1 application at 05/39/76 2111  . LORazepam (ATIVAN) injection 0.5-1 mg  0.5-1 mg Intravenous Q8H PRN Michael Boston, MD      . magic mouthwash  15 mL Oral QID  PRN Michael Boston, MD      . Melatonin TABS 3-6 mg  3-6 mg Oral Ardeen Fillers, MD      . meloxicam [facility]) tablet 15 mg  15 mg Oral Ardeen Fillers, MD   15 mg at 07/25/18 2109  . menthol-cetylpyridinium (CEPACOL) lozenge 3 mg  1 lozenge Oral PRN Michael Boston, MD      . metoCLOPramide (REGLAN) injection 5-10 mg  5-10 mg Intravenous Q8H PRN Michael Boston, MD   10 mg at 07/24/18 1852  . metoprolol tartrate (LOPRESSOR) injection 5 mg  5 mg Intravenous Q6H PRN Michael Boston, MD      . multivitamin with minerals tablet 2 tablet  2 tablet Oral Ardeen Fillers, MD   2 tablet at 07/25/18 2109  . nebivolol (BYSTOLIC) tablet 5 mg  5 mg Oral Ardeen Fillers, MD   5 mg at 07/25/18 2109  . ondansetron (ZOFRAN-ODT) disintegrating tablet 4 mg  4 mg Oral Q6H PRN Michael Boston, MD       Or  . ondansetron (ZOFRAN) injection 4 mg  4 mg Intravenous Q6H PRN Michael Boston, MD      . pantoprazole (PROTONIX) EC tablet 80 mg  80 mg Oral Daily Michael Boston, MD   80 mg at 07/25/18 0915  . phenol (CHLORASEPTIC) mouth spray 1-2 spray  1-2 spray Mouth/Throat PRN Michael Boston, MD      . polyethylene glycol (MIRALAX / GLYCOLAX) packet 17 g  17 g Oral Daily PRN Michael Boston, MD      . prochlorperazine (COMPAZINE) tablet 10 mg  10 mg Oral Q6H PRN Michael Boston, MD       Or  . prochlorperazine (COMPAZINE) injection 5-10 mg  5-10 mg Intravenous Q6H PRN Michael Boston, MD      . rosuvastatin (CRESTOR) tablet 5 mg  5 mg Oral Ardeen Fillers, MD   5 mg at 07/25/18 2109  . simethicone (MYLICON) chewable tablet 40 mg  40 mg Oral Q6H PRN Michael Boston, MD      . tiZANidine (ZANAFLEX) tablet 4 mg  4 mg Oral TID Michael Boston, MD   4 mg at 07/25/18 2109  . zolpidem (AMBIEN) tablet 5 mg  5 mg Oral QHS PRN Michael Boston, MD       Facility-Administered Medications Ordered in Other Encounters  Medication Dose Route Frequency Provider Last Rate Last Dose  . 0.9 %  sodium chloride infusion   Intravenous Once  Truitt Merle, MD          Allergies  Allergen Reactions  . Caine-1 [Lidocaine] Swelling and Rash    Eyes swell shut; includes all caine drugs except marcaine. EMLA cream OK though (?!)  . Sulfa Antibiotics Nausea And Vomiting and Rash  . Adhesive [Tape] Other (See Comments)    Blisters - can use paper tape  . Doxycycline Nausea Only  . Flagyl [Metronidazole] Nausea Only  . Iron Nausea Only  . Oxycodone Other (See Comments)    NIGHTMARES. (tolerates hydrocodone or tramadol better)  . Penicillins Rash    Has patient had a PCN reaction causing immediate rash, facial/tongue/throat swelling, SOB or lightheadedness with hypotension: no Has patient had a PCN reaction causing severe rash involving mucus membranes or skin necrosis: no Has patient had a PCN reaction that required hospitalization no Has patient had a PCN reaction occurring within the last 10 years: no If all of the above answers are "NO", then may proceed with Cephalosporin use.     Signed: Morton Peters, MD, FACS, MASCRS [facility]    [address] [place], [place] [address] [phone] Main / Paging [phone] Fax   07/26/2018, 8:14 AM

## 2018-07-26 NOTE — Discharge Instructions (Signed)
[facility] REPAIR: POST OP INSTRUCTIONS  ######################################################################  EAT Gradually transition to a high fiber diet with a fiber supplement over the next few weeks after discharge.  Start with a pureed / full liquid diet (see below)  WALK Walk an hour a day.  Control your pain to do that.    CONTROL PAIN Control pain so that you can walk, sleep, tolerate sneezing/coughing, and go up/down stairs.  HAVE A BOWEL MOVEMENT DAILY Keep your bowels regular to avoid problems.  OK to try a laxative to override constipation.  OK to use an antidairrheal to slow down diarrhea.  Call if not better after 2 tries  CALL IF YOU HAVE PROBLEMS/CONCERNS Call if you are still struggling despite following these instructions. Call if you have concerns not answered by these instructions  ######################################################################    1. DIET: Follow a light bland diet the first 24 hours after arrival home, such as soup, liquids, crackers, etc.  Be sure to include lots of fluids daily.  Advance to a low fat / high fiber diet over the next few days after surgery.  Avoid fast food or heavy meals the first week as your are more likely to get nauseated.    2. Take your usually prescribed home medications unless otherwise directed.  3. PAIN CONTROL: a. Pain is best controlled by a usual combination of three different methods TOGETHER: i. Ice/Heat ii. Over the counter pain medication iii. Prescription pain medication b. Most patients will experience some swelling and bruising around the hernia(s) such as the bellybutton, groins, or old incisions.  Ice packs or heating pads (30-60 minutes up to 6 times a day) will help. Use ice for the first few days to help decrease swelling and bruising, then switch to heat to help relax tight/sore spots and speed recovery.  Some people prefer to use ice alone, heat alone, alternating between ice & heat.  Experiment  to what works for you.  Swelling and bruising can take several weeks to resolve.   c. It is helpful to take an over-the-counter pain medication regularly for the first few weeks.  Choose one of the following that works best for you: i. Naproxen (Aleve, etc)  Two 253m tabs twice a day ii. Ibuprofen (Advil, etc) Three 2050mtabs four times a day (every meal & bedtime) iii. Acetaminophen (Tylenol, etc) 325-65084mour times a day (every meal & bedtime) d. A  prescription for pain medication should be given to you upon discharge.  Take your pain medication as prescribed.  i. If you are having problems/concerns with the prescription medicine (does not control pain, nausea, vomiting, rash, itching, etc), please call us [phone] see if we need to switch you to a different pain medicine that will work better for you and/or control your side effect better. ii. If you need a refill on your pain medication, please contact your pharmacy.  They will contact our office to request authorization. Prescriptions will not be filled after 5 pm or on week-ends.  4. Avoid getting constipated.  Between the surgery and the pain medications, it is common to experience some constipation.  Increasing fluid intake and taking a fiber supplement (such as Metamucil, Citrucel, FiberCon, MiraLax, etc) 1-2 times a day regularly will usually help prevent this problem from occurring.  A mild laxative (prune juice, Milk of Magnesia, MiraLax, etc) should be taken according to package directions if there are no bowel movements after 48 hours.    5. Wash / shower every  day.  You may shower over the dressings as they are waterproof.    6. Remove your waterproof bandages, skin tapes, and other bandages 5 days after surgery. You may replace a dressing/Band-Aid to cover the incision for comfort if you wish. You may leave the incisions open to air.  You may replace a dressing/Band-Aid to cover an incision for comfort if you wish.   Continue to shower over incision(s) after the dressing is off.  7. ACTIVITIES as tolerated:   a. You may resume regular (light) daily activities beginning the next day--such as daily self-care, walking, climbing stairs--gradually increasing activities as tolerated.  Control your pain so that you can walk an hour a day.  If you can walk 30 minutes without difficulty, it is safe to try more intense activity such as jogging, treadmill, bicycling, low-impact aerobics, swimming, etc. b. Save the most intensive and strenuous activity for last such as sit-ups, heavy lifting, contact sports, etc  Refrain from any heavy lifting or straining until you are off narcotics for pain control.   c. DO NOT PUSH THROUGH PAIN.  Let pain be your guide: If it hurts to do something, don't do it.  Pain is your body warning you to avoid that activity for another week until the pain goes down. d. You may drive when you are no longer taking prescription pain medication, you can comfortably wear a seatbelt, and you can safely maneuver your car and apply brakes. e. Dennis Bast may have sexual intercourse when it is comfortable.   8. FOLLOW UP in our office a. Please call [facility] at [phone] to set up an appointment to see your surgeon in the office for a follow-up appointment approximately 2-3 weeks after your surgery. b. Make sure that you call for this appointment the day you arrive home to insure a convenient appointment time.  9.  If you have disability of FMLA / Family leave forms, please bring the forms to the office for processing.  (do not give to your surgeon).  WHEN TO CALL us [phone]: 1. Poor pain control 2. Reactions / problems with new medications (rash/itching, nausea, etc)  3. Fever over 101.5 F (38.5 C) 4. Inability to urinate 5. Nausea and/or vomiting 6. Worsening swelling or bruising 7. Continued bleeding from incision. 8. Increased pain, redness, or drainage from the incision   The clinic staff is  available to answer your questions during regular business hours (8:30am-5pm).  Please dont hesitate to call and ask to speak to one of our nurses for clinical concerns.   If you have a medical emergency, go to the nearest emergency room or call 911.  A surgeon from [facility] is always on call at the hospitals in [facility], [place], [place], [place], [place]  [address] ?  [address] [address], [place], [place]   [address] MAIN: [phone] ? TOLL FREE: [phone] ? FAX: [phone] [email or web address]  DRAIN CARE:   You have a closed bulb drain to help you heal.    A bulb drain is a small, plastic reservoir which creates a gentle suction. It is used to remove excess fluid from a surgical wound. The color and amount of fluid will vary. Immediately after surgery, the fluid is bright red. It may gradually change to a yellow color. When the amount decreases to about 1 or 2 tablespoons (15 to 30 cc) per 24 hours, your caregiver will usually remove it.  [place]  The Jackson-Pratt drainage system has flexible tubing attached to a soft, plastic bulb with a stopper. The drainage end of the tubing, which is flat and white, goes into your body through a small opening near your incision (surgical cut). A stitch holds the drainage end in place. The rest of the tube is outside your body, attached to the bulb. When the bulb is compressed with the stopper in place, it creates a vacuum. This causes a constant gentle suction, which helps draw out fluid that collects under your incision. The bulb should be compressed at all times, except when you are emptying the drainage.  How long you will have your Jackson-Pratt depends on your surgery and the amount of fluid is draining. This is different for everyone. The Jackson-Pratt is usually removed when the drainage is 30 mL or less over 24 hours. To keep track of how much drainage youre having, you  will record the amount in a drainage log. Its important to bring the log with you to your follow-up appointments.  Caring for Your Jackson-Pratt at Home In order to care for your Jackson-Pratt at home, you or your caregiver will do the following:  Empty the drain once a day and record the color and amount of drainage  Care for the area where the tubing enters your skin by washing with soap and water.  Milk the tubing to help move clots into the bulb.  Do this before you empty and measure your drainage. Look in the mirror at the tubing. This will help you see where your hands need to be. Pinch the tubing close to where it goes into your skin between your thumb and forefinger. With the thumb and forefinger of your other hand, pinch the tubing right below your other fingers. Keep your fingers pinched and slide them down the tubing, pushing any clots down toward the bulb. You may want to use alcohol swabs to help you slide your fingers down the tubing. Repeat steps 3 and 4 as necessary to push clots from the tubing into the bulb. If you are not able to move a clot into the bulb, call your doctors office. The fluid may leak around the insertion site if a clot is blocking the drainage flow. If there is fluid in the bulb and no leakage at the insertion site, the drain is working.  How to Empty Your Jackson-Pratt and Record the Drainage You will need to empty your Jackson-Pratt every day  Gather the following supplies:  Measuring container your nurse gave you Jackson-Pratt Drainage Record  Pen or pencil  Instructions Clean an area to work on. Clean your hands thoroughly. Unplug the stopper on top of your Jackson-Pratt. This will cause the bulb to expand. Do not touch the inside of the stopper or the inner area of the opening on the bulb. Turn your Jackson-Pratt upside down, gently squeeze the bulb, and pour the drainage into the measuring container. Turn your Jackson-Pratt right side  up. Squeeze the bulb until your fingers feel the palm of your hand. Keep squeezing the bulb while you replug the stopper. Make sure the bulb stays fully compressed to ensure constant, gentle suction.    Check the amount and color of drainage in the measuring container. The first couple days after surgery the fluid may be dark red. This is normal. As you heal the fluid may look pink or pale yellow. Record this amount and the color of drainage on your Jackson-Pratt Drainage Record. Flush the drainage  down the toilet and rinse the measuring container with water.  Caring for the Insertion Site  Once you have emptied the drainage, clean your hands again. Check the area around the insertion site. Look for tenderness, swelling, or pus. If you have any of these, or if you have a temperature of 101 F (38.3 C) or higher, you may have an infection. Call your doctors office.  Sometimes, the drain causes redness the size of a dime at your insertion site. This is normal. Your healthcare provider will tell you if you should place a bandage over the insertion site.  Wash drain site with soap & water (dilute hydrogen peroxide PRN) daily & replace clean dressing / tape    DAILY CARE  Keep the bulb compressed at all times, except while emptying it. The compression creates suction.   Keep sites where the tubes enter the skin dry and covered with a light bandage (dressing).   Tape the tubes to your skin, 1 to 2 inches below the insertion sites, to keep from pulling on your stitches. Tubes are stitched in place and will not slip out.   Pin the bulb to your shirt (not to your pants) with a safety pin.   For the first few days after surgery, there usually is more fluid in the bulb. Empty the bulb whenever it becomes half full because the bulb does not create enough suction if it is too full. Include this amount in your 24 hour totals.   When the amount of drainage decreases, empty the bulb at the same  time every day. Write down the amounts and the 24 hour totals. Your caregiver will want to know them. This helps your caregiver know when the tubes can be removed.   (We anticipate removing the drain in 1-3 weeks, depending on when the output is <11m a day for 2+ days)  If there is drainage around the tube sites, change dressings and keep the area dry. If you see a clot in the tube, leave it alone. However, if the tube does not appear to be draining, let your caregiver know.  TO EMPTY THE BULB  Open the stopper to release suction.   Holding the stopper out of the way, pour drainage into the measuring cup that was sent home with you.   Measure and write down the amount. If there are 2 bulbs, note the amount of drainage from bulb 1 or bulb 2 and keep the totals separate. Your caregiver will want to know which tube is draining more.   Compress the bulb by folding it in half.   Replace the stopper.   Check the tape that holds the tube to your skin, and pin the bulb to your shirt.  SEEK MEDICAL CARE IF:  The drainage develops a bad odor.   You have an oral temperature above 102 F (38.9 C).   The amount of drainage from your wound suddenly increases or decreases.   You accidentally pull out your drain.   You have any other questions or concerns.  MAKE SURE YOU:   Understand these instructions.   Will watch your condition.   Will get help right away if you are not doing well or get worse.     Call our office if you have any questions about your drain. [phone]   Managing Pain  ######################################################################   CONTROL PAIN Control pain so that you can walk, sleep, tolerate sneezing/coughing, go up/down stairs.  (Good pain control is  not pain free only when lying still, unable to move)  WALK Walk an hour a day.  Control your pain to do that.   HAVE A BOWEL MOVEMENT DAILY Keep your bowels regular to avoid problems.  OK to try  a laxative to override constipation.  OK to use an antidairrheal to slow down diarrhea.  Call if not better after 2 tries  CALL IF YOU HAVE PROBLEMS/CONCERNS Call if you are still struggling despite following these instructions. Call if you have concerns not answered by these instructions  ######################################################################    Pain after surgery or related to activity is often due to strain/injury to muscle, tendon, nerves and/or incisions.  This pain is usually short-term and will improve in a few months.   Many people find it helpful to do the following things TOGETHER to help speed the process of healing and to get back to regular activity more quickly:  1. Avoid heavy physical activity at first a. No lifting greater than 20 pounds at first, then increase to lifting as tolerated over the next few weeks b. Do not push through the pain.  Listen to your body and avoid positions and maneuvers than reproduce the pain.  Wait a few days before trying something more intense c. Walking is okay as tolerated, but go slowly and stop when getting sore.  If you can walk 30 minutes without stopping or pain, you can try more intense activity (running, jogging, aerobics, cycling, swimming, treadmill, sex, sports, weightlifting, etc ) d. Remember: If it hurts to do it, then dont do it!  2. Take Anti-inflammatory medication a. Choose ONE of the following over-the-counter medications: i.            Acetaminophen 557m tabs (Tylenol) 1-2 pills with every meal and just before bedtime (avoid if you have liver problems) ii.            Naproxen 2282mtabs (ex. Aleve) 1-2 pills twice a day (avoid if you have kidney, stomach, IBD, or bleeding problems) iii. Ibuprofen 20020mabs (ex. Advil, Motrin) 3-4 pills with every meal and just before bedtime (avoid if you have kidney, stomach, IBD, or bleeding problems) b. Take with food/snack around the clock for 1-2 weeks i. This helps  the muscle and nerve tissues become less irritable and calm down faster  3. Use a Heating pad or Ice/Cold Pack a. 4-6 times a day b. May use warm bath/hottub  or showers  4. Try Gentle Massage and/or Stretching  a. at the area of pain many times a day b. stop if you feel pain - do not overdo it  Try these steps together to help you body heal faster and avoid making things get worse.  Doing just one of these things may not be enough.    If you are not getting better after two weeks or are noticing you are getting worse, contact our office for further advice; we may need to re-evaluate you & see what other things we can do to help.

## 2018-07-28 ENCOUNTER — Telehealth: Payer: Self-pay

## 2018-07-28 NOTE — Telephone Encounter (Signed)
-----   Message from Truitt Merle, MD sent at 07/26/2018  8:13 PM EST ----- Please let pt know her CMP and CEA were normal, no concerns, thanks   Truitt Merle 07/26/2018

## 2018-07-28 NOTE — Telephone Encounter (Signed)
Spoke with patient regarding lab results, per Dr. Burr Medico CMP and CEA are normal, no concerns, patient verbalized an understanding.

## 2019-01-05 ENCOUNTER — Other Ambulatory Visit: Payer: Self-pay | Admitting: Hematology

## 2019-01-20 ENCOUNTER — Inpatient Hospital Stay: Payer: Medicare Other

## 2019-01-20 ENCOUNTER — Other Ambulatory Visit: Payer: Self-pay

## 2019-01-20 ENCOUNTER — Inpatient Hospital Stay (HOSPITAL_BASED_OUTPATIENT_CLINIC_OR_DEPARTMENT_OTHER): Payer: Medicare Other | Admitting: Nurse Practitioner

## 2019-01-20 ENCOUNTER — Telehealth: Payer: Self-pay | Admitting: Nurse Practitioner

## 2019-01-20 ENCOUNTER — Encounter: Payer: Self-pay | Admitting: Nurse Practitioner

## 2019-01-20 ENCOUNTER — Telehealth: Payer: Self-pay | Admitting: *Deleted

## 2019-01-20 ENCOUNTER — Inpatient Hospital Stay: Payer: Medicare Other | Attending: Nurse Practitioner

## 2019-01-20 VITALS — BP 146/79 | HR 68 | Temp 98.3°F | Resp 17 | Ht 70.5 in | Wt 338.0 lb

## 2019-01-20 DIAGNOSIS — Z933 Colostomy status: Secondary | ICD-10-CM

## 2019-01-20 DIAGNOSIS — Z800 Family history of malignant neoplasm of digestive organs: Secondary | ICD-10-CM | POA: Diagnosis not present

## 2019-01-20 DIAGNOSIS — Z808 Family history of malignant neoplasm of other organs or systems: Secondary | ICD-10-CM

## 2019-01-20 DIAGNOSIS — R55 Syncope and collapse: Secondary | ICD-10-CM | POA: Diagnosis not present

## 2019-01-20 DIAGNOSIS — Z85048 Personal history of other malignant neoplasm of rectum, rectosigmoid junction, and anus: Secondary | ICD-10-CM | POA: Insufficient documentation

## 2019-01-20 DIAGNOSIS — Z862 Personal history of diseases of the blood and blood-forming organs and certain disorders involving the immune mechanism: Secondary | ICD-10-CM | POA: Diagnosis not present

## 2019-01-20 DIAGNOSIS — Z9221 Personal history of antineoplastic chemotherapy: Secondary | ICD-10-CM

## 2019-01-20 DIAGNOSIS — R109 Unspecified abdominal pain: Secondary | ICD-10-CM

## 2019-01-20 DIAGNOSIS — I10 Essential (primary) hypertension: Secondary | ICD-10-CM | POA: Insufficient documentation

## 2019-01-20 DIAGNOSIS — N898 Other specified noninflammatory disorders of vagina: Secondary | ICD-10-CM

## 2019-01-20 DIAGNOSIS — C20 Malignant neoplasm of rectum: Secondary | ICD-10-CM

## 2019-01-20 DIAGNOSIS — G8929 Other chronic pain: Secondary | ICD-10-CM | POA: Insufficient documentation

## 2019-01-20 DIAGNOSIS — Z803 Family history of malignant neoplasm of breast: Secondary | ICD-10-CM | POA: Insufficient documentation

## 2019-01-20 DIAGNOSIS — Z923 Personal history of irradiation: Secondary | ICD-10-CM | POA: Diagnosis not present

## 2019-01-20 LAB — CMP (CANCER CENTER ONLY)
ALT: 17 U/L (ref 0–44)
AST: 12 U/L — ABNORMAL LOW (ref 15–41)
Albumin: 3.7 g/dL (ref 3.5–5.0)
Alkaline Phosphatase: 93 U/L (ref 38–126)
Anion gap: 8 (ref 5–15)
BUN: 12 mg/dL (ref 6–20)
CO2: 26 mmol/L (ref 22–32)
Calcium: 9.4 mg/dL (ref 8.9–10.3)
Chloride: 106 mmol/L (ref 98–111)
Creatinine: 0.72 mg/dL (ref 0.44–1.00)
GFR, Est AFR Am: >60 mL/min (ref >60)
GFR, Estimated: >60 mL/min (ref >60)
Glucose, Bld: 110 mg/dL — ABNORMAL HIGH (ref 70–99)
Potassium: 4.2 mmol/L (ref 3.5–5.1)
Sodium: 140 mmol/L (ref 135–145)
Total Bilirubin: 0.4 mg/dL (ref 0.3–1.2)
Total Protein: 7.1 g/dL (ref 6.5–8.1)

## 2019-01-20 LAB — CBC WITH DIFFERENTIAL/PLATELET
Abs Immature Granulocytes: 0.03 10*3/uL (ref 0.00–0.07)
Basophils Absolute: 0.1 10*3/uL (ref 0.0–0.1)
Basophils Relative: 1 %
Eosinophils Absolute: 0.2 10*3/uL (ref 0.0–0.5)
Eosinophils Relative: 3 %
HCT: 37.7 % (ref 36.0–46.0)
Hemoglobin: 11.8 g/dL — ABNORMAL LOW (ref 12.0–15.0)
Immature Granulocytes: 1 %
Lymphocytes Relative: 21 %
Lymphs Abs: 1.3 10*3/uL (ref 0.7–4.0)
MCH: 28 pg (ref 26.0–34.0)
MCHC: 31.3 g/dL (ref 30.0–36.0)
MCV: 89.5 fL (ref 80.0–100.0)
Monocytes Absolute: 0.4 10*3/uL (ref 0.1–1.0)
Monocytes Relative: 6 %
Neutro Abs: 4.5 10*3/uL (ref 1.7–7.7)
Neutrophils Relative %: 68 %
Platelets: 258 10*3/uL (ref 150–400)
RBC: 4.21 MIL/uL (ref 3.87–5.11)
RDW: 14 % (ref 11.5–15.5)
WBC: 6.5 10*3/uL (ref 4.0–10.5)
nRBC: 0 % (ref 0.0–0.2)

## 2019-01-20 LAB — CEA (IN HOUSE-CHCC): CEA (CHCC-In House): 1.57 ng/mL (ref 0.00–5.00)

## 2019-01-20 LAB — IRON AND TIBC
Iron: 70 ug/dL (ref 41–142)
Saturation Ratios: 20 % — ABNORMAL LOW (ref 21–57)
TIBC: 355 ug/dL (ref 236–444)
UIBC: 285 ug/dL (ref 120–384)

## 2019-01-20 LAB — FERRITIN: Ferritin: 54 ng/mL (ref 11–307)

## 2019-01-20 NOTE — Telephone Encounter (Signed)
Scheduled appt per 7/8 los.   Patient declined calendar and avs. (she is my-chart active)

## 2019-01-20 NOTE — Telephone Encounter (Signed)
-----   Message from Alla Feeling, NP sent at 01/20/2019  4:14 PM EDT ----- Please let her know iron level. Since she is having some bleeding I suggest she take 1 iron tab every day or every other day if she can tolerate. I also ordered surveillance CT, let her know scheduling will call her.  Thanks, lacie NP

## 2019-01-20 NOTE — Progress Notes (Signed)
[place]   Telephone:([phone]) [phone] Fax:(336) [phone]   Clinic Follow up Note   Patient Care Team: [name], MD as PCP - General (Family Medicine) Jerline Pain, MD as PCP - Cardiology (Cardiology) Michael Boston, MD as Consulting Physician (General Surgery) Truitt Merle, MD as Consulting Physician (Medical Oncology) Kyung Rudd, MD as Consulting Physician (Radiation Oncology) Teena Irani, MD (Inactive) as Consulting Physician (Gastroenterology) Raynelle Bring, MD as Consulting Physician (Urology) 01/20/2019  CHIEF COMPLAINT: f/u rectal cancer    SUMMARY OF ONCOLOGIC HISTORY: Oncology History Overview Note  Rectal adenocarcinoma   Staging form: Colon and Rectum, AJCC 7th Edition     Clinical: T3, N2, M0 - Unsigned     Rectal adenocarcinoma s/p LAR resection 05/25/2015  01/13/2015 Initial Diagnosis   Rectal adenocarcinoma   01/13/2015 Procedure   Colonoscopy showed a sensitivity O nonobstructing mass in the rectum and from 12-18 cm proximal to the Annis. The mass was circumferential, measuring about 6 cm in length. EGD was negative.   01/13/2015 Cancer Staging   Staging form: Colon and Rectum, AJCC 7th Edition - Clinical stage from 01/13/2015: T3, N2, M0 - Signed by Truitt Merle, MD on 12/15/2017   01/17/2015 Tumor Marker   CEA 1.4, CA-19-9 8, MMR normal    01/18/2015 Procedure   Lower endoscopic ultrasound by Dr. Paulita Fujita showed a T3 N2 rectal mass.   01/20/2015 Imaging   CT abdomen and pelvis with contrast showed right lateral rectal wall exophytic mass, and 2 ill-defined hepatic hypoenhancing lesions which appears to correspond to the lesion seen on the prior MRI in 2015.   02/05/2015 Imaging   abdomen MRI showed 2 hemangioma, no suspicion for metastatic disease. CT chest was negative   02/20/2015 - 03/30/2015 Radiation Therapy   neoadjuvant RT to rectal cancer    02/20/2015 Concurrent Chemotherapy   capecitabine 2500 mg in the morning and 2000 mg in the evening  (811m/m2, bid), on the day of radiation.    05/25/2015 Surgery   Recto-sigmoid segmental resection, margins are negative    05/25/2015 Pathology Results   0.2cm residual invasive adenocarcinoma, G2, negative margins, 12 nodes were negative    05/25/2015 Cancer Staging   Staging form: Colon and Rectum, AJCC 7th Edition - Pathologic stage from 05/25/2015: Stage I (T1, N0, cM0) - Signed by FTruitt Merle MD on 12/15/2017   06/23/2015 - 06/29/2015 Hospital Admission   Patient was admitted for pelvic abscess, drain placed and she was treated with IV antibiotics, she also received 1 dose of Feraheme for anemia.   09/15/2015 - 09/17/2015 Hospital Admission   Recurrent pelvic collection s/p perc drainage 09/16/2015   02/21/2016 Imaging   CT abdomen and pelvis w contrast IMPRESSION: 1. Interval removal of surgical drains since 12/19/2015 from the presacral space. Similar size of ill-defined presacral fluid and gas, for which residual abscess cannot be excluded. Similar amount of intraperitoneal edema throughout the upper pelvis with foci of extraluminal gas, possibly related to the presacral process. 2. Diverting right-sided ileostomy, without acute complication. 3. Similar moderate hydroureteronephrosis, likely due to the pelvic process. 4. Possible bladder wall thickening. Correlate with urinalysis. This appearance could be partially due to underdistention. 5. Geographic hepatic steatosis and hemangiomas. Cannot exclude a new right hepatic lobe lesion. Consider nonemergent pre and post contrast outpatient MRI. This could represent a new area of focal steatosis.   05/31/2016 Surgery   ileostomy takedown BY Dr. GJohney Maine   07/04/2016 Imaging   CT Abdomen Pelvis W Contrast  07/04/16 IMPRESSION: Subcutaneous fluid collection at the site of ileostomy takedown, could be sterile or infected, measuring 9.3 x 4.3 x 5.3 cm; this could be aspirated under ultrasound guidance if clinically indicated. Large  collection of stool within the presacral space, could potentially be within a distended distorted rectum but since this occupies the same position as the abscess collection identified on the previous CT this is suspicious for a contiguous contained extraluminal stool collection communicating with the rectosigmoid colon, collection overall roughly 7 x 8 x 9 cm.   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   12/26/2016 Imaging   IMPRESSION: 1. Stable CT exam of the chest.  No evidence for metastatic disease. 2. Interval left colectomy with left abdominal end colostomy. Presacral soft tissue is similar to prior study when taking into account the interval surgery. 3. Hepatic dome lesion seen on the previous study not evident today, potentially related to bolus timing. The 2.4 cm subcapsular inferior right liver lesion remains subtle and is barely visible on today's exam but shows no substantial interval change in size. Continued attention on follow-up recommended.    12/09/2017 Imaging   CT CAP W Contrast 12/09/17  IMPRESSION: 1. Stable post treatment changes in the presacral space with no evidence of recurrent disease in the pelvis. 2. No evidence of metastatic disease in the chest, abdomen or pelvis. 3. Large parastomal hernia has increased in size and contains much of the remnant left colon. No evidence of bowel obstruction or ischemia. 4. Stable dilated main pulmonary artery, suggesting chronic pulmonary arterial hypertension. 5.  Aortic Atherosclerosis (ICD10-I70.0).   12/31/2017 Genetic Testing   The Multi-Cancer Panel offered by [facility] includes sequencing and/or deletion duplication testing of the following 83 genes: ALK, APC, ATM, AXIN2,BAP1,  BARD1, BLM, BMPR1A, BRCA1, BRCA2, BRIP1, CASR, CDC73, CDH1, CDK4, CDKN1B, CDKN1C, CDKN2A (p14ARF), CDKN2A (p16INK4a), CEBPA, CHEK2, CTNNA1, DICER1, DIS3L2, EGFR  (c.2369C>T, p.Thr790Met variant only), EPCAM (Deletion/duplication testing only), FH, FLCN, GATA2, GPC3, GREM1 (Promoter region deletion/duplication testing only), HOXB13 (c.251G>A, p.Gly84Glu), HRAS, KIT, MAX, MEN1, MET, MITF (c.952G>A, p.Glu318Lys variant only), MLH1, MSH2, MSH3, MSH6, MUTYH, NBN, NF1, NF2, NTHL1, PALB2, PDGFRA, PHOX2B, PMS2, POLD1, POLE, POT1, PRKAR1A, PTCH1, PTEN, RAD50, RAD51C, RAD51D, RB1, RECQL4, RET, RUNX1, SDHAF2, SDHA (sequence changes only), SDHB, SDHC, SDHD, SMAD4, SMARCA4, SMARCB1, SMARCE1, STK11, SUFU, TERC, TERT, TMEM127, TP53, TSC1, TSC2, VHL, WRN and WT1.   Results: Negative, no pathogenic variants identified.  The date of this test report is 12/31/2017.      CURRENT THERAPY: Surveillance  INTERVAL HISTORY: Lindsay Little returns for surveillance f/u as scheduled. She was last seen by Dr. Burr Medico on 07/21/18. She underwent ventral hernia repair on 07/23/18 by Dr. Johney Maine. She recovered slowly and continues to have chronic abdominal pain from multiple surgeries. She also has chronic daily vaginal discharge and mild bleeding which began at the time of her initial colostomy. She has been to specialists for this. Has h/o iron deficiency, takes 2 multivitamin daily. Appetite and weight are normal for her. Denies change in bowel habits or blood in stool. She has more chest pressure lately and syncope while painting outside one day; has had few more near syncopal episodes. Denies fever, cough, dyspnea.    MEDICAL HISTORY:  Past Medical History:  Diagnosis Date  . Anxiety   . Chemotherapy-induced neuropathy (HCC)    toes and fingers numbness and tingling  . Colorectal delayed anastomotic leak s/p resection & colostomy  07/12/2016 11/17/2015  . Colostomy in place [facility]) 07/12/2016   due to anastomosis breakdown w/ colovaginal fistula  . Colovaginal fistula s/p omentopexy repair 07/12/2016 07/10/2016  . Complication of anesthesia   . Depression   . Family history of adverse reaction to  anesthesia    mother-- ponv  . Family history of breast cancer   . Family history of pancreatic cancer   . Family history of skin cancer   . Gastroenteritis 12/20/2015  . GERD (gastroesophageal reflux disease)   . Hiatal hernia   . History of cancer chemotherapy 02-20-2015 to 03-30-2015  . History of cardiac murmur as a child   . History of chemotherapy   . History of chronic gastritis   . History of hypertension    no issues since multiple abdminal sx's and chemo--- no medication since 12/ 2016  . History of TMJ disorder   . Hypercholesteremia   . Hypertension   . IBS (irritable bowel syndrome)    dx age 15  . Intermittent abdominal pain    post-op multiple abdominal sx's   . Microcytic anemia   . Mild sleep apnea    per pt study 2014  very mild osa , no cpap recommended, recommeded wt loss and sleep routine  . OA (osteoarthritis)    left knee /  left shoulder  . Obesity   . PONV (postoperative nausea and vomiting)    severe" needs Scopolamine PATCH"   . Portacath in place    right chest  . Rectal adenocarcinoma ([place]) oncologis-  dr Burr Medico-- after radiation/ chemo (ypT1, N0) --  no recurrence per last note 03/ 2018   dx 01-13-2015-- Stage IIIC (T3, N2, M0) post concurrent radiation and chemotherapy 02-20-2015 to 03-30-2015 /  05-25-2015 s/p  LAR w/ RSO (post-op complicated by late abscess and contained anatomotic leak w/ help percutaneous drainage and antibiotics)   . Rotator cuff tear, left   . S/P radiation therapy 02/20/15-03/30/15   colon/rectal  . Vitamin D deficiency   . Wears glasses   . Wears glasses     SURGICAL HISTORY: Past Surgical History:  Procedure Laterality Date  . ABDOMINAL HYSTERECTOMY  1996   uterus and cervix  . BACK SURGERY  02/12/2018   lumbar surgery  . COLON RESECTION N/A 07/12/2016   Procedure: LAPAROSCOPIC LYSIS OF ADHESIONS, OMENTOPEXY, HAND ASSISTED RESECTION OF  COLON, END TO END ANASTOMOSIS, COLOSTOMY;  Surgeon: Michael Boston, MD;  Location: [facility];  Service: General;  Laterality: N/A;  . COMBINED HYSTEROSCOPY DIAGNOSTIC / D&C  x2 1990's  . DIAGNOSTIC LAPAROSCOPY  age 46 and age 73  . EUS N/A 01/18/2015   Procedure: LOWER ENDOSCOPIC ULTRASOUND (EUS);  Surgeon: Arta Silence, MD;  Location: [facility];  Service: Endoscopy;  Laterality: N/A;  . EXCISION OF SKIN TAG  11/17/2015   Procedure: EXCISION OF SKIN TAG;  Surgeon: Michael Boston, MD;  Location: [facility];  Service: General;;  . ILEO LOOP COLOSTOMY CLOSURE N/A 11/17/2015   Procedure: LAPAROSCOPIC DIVERTING LOOP ILEOSTOMY  DRAINAGE OF PELVIC ABSCESS;  Surgeon: Michael Boston, MD;  Location: [facility];  Service: General;  Laterality: N/A;  . ILEOSTOMY CLOSURE N/A 05/31/2016   Procedure: TAKEDOWN LOOP ILEOSTOMY;  Surgeon: Michael Boston, MD;  Location: [facility];  Service: General;  Laterality: N/A;  . IMPACTION REMOVAL  11/17/2015   Procedure: DISIMPACTION REMOVAL;  Surgeon: Michael Boston, MD;  Location: [facility];  Service: General;;  . INSERTION OF MESH N/A 07/23/2018   Procedure: INSERTION OF MESH  X2;  Surgeon: Michael Boston, MD;  Location: [facility];  Service: General;  Laterality: N/A;  . KNEE ARTHROSCOPY Left 1990's  . KNEE ARTHROSCOPY W/ MENISCECTOMY Left 09/14/2009   and chondroplasty debridement  . LAPAROSCOPIC LYSIS OF ADHESIONS  11/17/2015   Procedure: LAPAROSCOPIC LYSIS OF ADHESIONS;  Surgeon: Michael Boston, MD;  Location: [facility];  Service: General;;  . LUMBAR WOUND DEBRIDEMENT N/A 04/24/2018   Procedure: wound exploration, irrigation and debridement;  Surgeon: Eustace Moore, MD;  Location: [facility];  Service: Neurosurgery;  Laterality: N/A;  wound exploration, irrigation and debridement  . LYSIS OF ADHESION N/A 07/23/2018   Procedure: LYSIS OF ADHESIONS;  Surgeon: Michael Boston, MD;  Location: [facility];  Service: General;  Laterality: N/A;  . PORT-A-CATH REMOVAL N/A 11/21/2016   Procedure: REMOVAL PORT-A-CATH;  Surgeon: Michael Boston, MD;  Location: [facility];  Service: General;   Laterality: N/A;  . PORTACATH PLACEMENT N/A 05/25/2015   Procedure: INSERTION PORT-A-CATH;  Surgeon: Michael Boston, MD;  Location: [facility];  Service: General;  Laterality: N/A;-remains inplace Right chest.  . ROTATOR CUFF REPAIR Right 2006  . TONSILLECTOMY  age 59  . VENTRAL HERNIA REPAIR N/A 07/23/2018   Procedure: LAPAROSCOPIC VENTRAL WALL HERNIA REPAIR;  Surgeon: Michael Boston, MD;  Location: [facility];  Service: General;  Laterality: N/A;  . XI ROBOTIC ASSISTED LOWER ANTERIOR RESECTION N/A 05/25/2015   Procedure: XI ROBOTIC ASSISTED LOWER ANTERIOR RESECTION, , RIGID PROCTOSCOPY, RIGHT OOPHORECTOMY;  Surgeon: Michael Boston, MD;  Location: [facility];  Service: General;  Laterality: N/A;    I have reviewed the social history and family history with the patient and they are unchanged from previous note.  ALLERGIES:  is allergic to caine-1 [lidocaine]; sulfa antibiotics; adhesive [tape]; doxycycline; flagyl [metronidazole]; iron; oxycodone; and penicillins.  MEDICATIONS:  Current Outpatient Medications  Medication Sig Dispense Refill  . gabapentin (NEURONTIN) 300 MG capsule Take 900 mg by mouth 4 (four) times daily.    . Cholecalciferol (VITAMIN D3) 5000 units CAPS Take 5,000 Units by mouth at bedtime.     . Coenzyme Q10 (COQ10) 200 MG CAPS Take 200 mg by mouth at bedtime.    . DULoxetine (CYMBALTA) 60 MG capsule Take 120 mg by mouth at bedtime.     Marland Kitchen esomeprazole (NEXIUM) 40 MG capsule Take 1 capsule (40 mg total) by mouth 2 (two) times daily before a meal. 60 capsule 5  . furosemide (LASIX) 20 MG tablet Take 20 mg by mouth daily as needed for edema.     Marland Kitchen HYDROcodone-acetaminophen (NORCO) 10-325 MG tablet Take 1-2 tablets by mouth every 6 (six) hours as needed for moderate pain or severe pain. 40 tablet 0  . Melatonin 3 MG TABS Take 3-6 mg by mouth at bedtime.     . meloxicam (MOBIC) 15 MG tablet Take 15 mg by mouth at bedtime.     . Multiple Vitamin (MULTIVITAMIN WITH MINERALS) TABS tablet Take 2  tablets by mouth at bedtime.     . nebivolol (BYSTOLIC) 5 MG tablet Take 5 mg by mouth at bedtime.     . polyethylene glycol (MIRALAX / GLYCOLAX) packet Take 17 g by mouth daily as needed for moderate constipation.    . rosuvastatin (CRESTOR) 5 MG tablet Take 5 mg by mouth at bedtime.    Marland Kitchen tiZANidine (ZANAFLEX) 4 MG tablet Take 4 mg by mouth 3 (three) times daily as needed for muscle spasms.   2  . triamcinolone lotion (KENALOG) 0.1 % Apply  1 application topically as needed (dry skin).     No current facility-administered medications for this visit.    Facility-Administered Medications Ordered in Other Visits  Medication Dose Route Frequency Provider Last Rate Last Dose  . 0.9 %  sodium chloride infusion   Intravenous Once Truitt Merle, MD        PHYSICAL EXAMINATION: ECOG PERFORMANCE STATUS: 1 - Symptomatic but completely ambulatory  Vitals:   01/20/19 1057  BP: (!) 146/79  Pulse: 68  Resp: 17  Temp: 98.3 F (36.8 C)  SpO2: 98%   Filed Weights   01/20/19 1057  Weight: (!) 338 lb (153.3 kg)    GENERAL:alert, no distress and comfortable SKIN: no rashes or significant lesions LYMPH:  no palpable inguinal lymphadenopathy LUNGS: respirations even and unlabored  HEART:  no lower extremity edema ABDOMEN:abdomen round, soft, diffuse mild tenderness  Musculoskeletal:no cyanosis of digits  NEURO: alert & oriented x 3 with fluent speech, normal gait   LABORATORY DATA:  I have reviewed the data as listed CBC Latest Ref Rng & Units 01/20/2019 07/21/2018 07/20/2018  WBC 4.0 - 10.5 K/uL 6.5 7.7 8.2  Hemoglobin 12.0 - 15.0 g/dL 11.8(L) 11.4(L) 12.2  Hematocrit 36.0 - 46.0 % 37.7 36.6 40.4  Platelets 150 - 400 K/uL 258 302 318     CMP Latest Ref Rng & Units 01/20/2019 07/21/2018 07/20/2018  Glucose 70 - 99 mg/dL 110(H) 104(H) 104(H)  BUN 6 - 20 mg/dL _0 Creatinine 0.44 - 1.00 mg/dL 0.72 0.72 0.66  Sodium 135 - 145 mmol/L 140 141 140  Potassium 3.5 - 5.1 mmol/L 4.2 4.2 4.7  Chloride 98  - 111 mmol/L 106 106 105  CO2 22 - 32 mmol/L _1 Calcium 8.9 - 10.3 mg/dL 9.4 9.9 9.8  Total Protein 6.5 - 8.1 g/dL 7.1 7.2 -  Total Bilirubin 0.3 - 1.2 mg/dL 0.4 0.3 -  Alkaline Phos 38 - 126 U/L 93 100 -  AST 15 - 41 U/L 12(L) 13(L) -  ALT 0 - 44 U/L 17 18 -      RADIOGRAPHIC STUDIES: I have personally reviewed the radiological images as listed and agreed with the findings in the report. No results found.   ASSESSMENT & PLAN: Lindsay Little is a 53 y.o. female with history of  1. Rectal adenocarcinomaT3N2M0, Stage IIIC , ypT1N0 after neoadjuvant chemotherapy and radiation -Diagnosed in 2016. Treated with neoadjuvant chemoRT and surgery.  -she was not able to receive adjuvant chemotherapy due to post op abscess.  -she is on surveillance; last CT in 11/2017 showed no evidence of recurrence -labs reviewed, CEA remains normal. Hb 11.8 which is stable from 6 months ago, she reports ongoing rectovaginal discharge with bleeding. -Iron studies today are normal, ferritin 54. I recommend to take 1 oral iron tab daily, if not tolerated can take every other day. She does not need IV Feraheme  -she is due for colonoscopy, she will call digestive health where she has this done -she has chronic abdominal pain from multiple surgeries. Physical exam is otherwise unremarkable, no clinical concern for recurrence -will obtain surveillance CT in 1-2 weeks -F/u in 6 months with Dr. Burr Medico if CT is negative to continue surveillance  -I encouraged her to eat healthy, drink well, and exercise routinely in the meantime.  2. Ventral hernia repair in 07/2018 per Dr. Johney Maine -she recovered slowly; continues to have diffuse abdominal pain related to multiple surgeries   3. Rectovaginal discharge and bleeding -  ongoing, she has seen specialists for this -per patient she will contact Dr. Johney Maine for referral if this escalates   4. Syncopal event  -she had syncopal episode 6 weeks ago while painting outside.  She has more chest pressure lately. Denies cough, dyspnea.  -I recommend to f/u with cardiology, she has f/u later this month -will include CT chest   3. HTN -She'll continue follow-up with her primary care physician   PLAN: -Labs reviewed -begin oral iron, 1 tab daily or every other day if not tolerated  -Lab in 3 months -Surveillance CT in 1-2 weeks -Cardiology f/u this month  -Patient to call digestive health to set up routine colonoscopy  -Lab, f/u with Dr. Burr Medico in 6 months if CT is negative, will call with result    No problem-specific Assessment & Plan notes found for this encounter.   Orders Placed This Encounter  Procedures  . CT Abdomen Pelvis W Contrast    Standing Status:   Future    Standing Expiration Date:   01/20/2020    Order Specific Question:   ** REASON FOR EXAM (FREE TEXT)    Answer:   rectal cancer 2016 s/p resection, ileostomy, multiple surgeries; surveillance    Order Specific Question:   If indicated for the ordered procedure, I authorize the administration of contrast media per Radiology protocol    Answer:   Yes    Order Specific Question:   Is patient pregnant?    Answer:   No    Order Specific Question:   Preferred imaging location?    Answer:   [facility]    Order Specific Question:   Is Oral Contrast requested for this exam?    Answer:   Yes, Per Radiology protocol    Order Specific Question:   Radiology Contrast Protocol - do NOT remove file path    Answer:   \\[email or web address]  . CT Chest W Contrast    Standing Status:   Future    Standing Expiration Date:   01/20/2020    Order Specific Question:   ** REASON FOR EXAM (FREE TEXT)    Answer:   s/p rectal cancer 2016 with resection, ileostomy; recent syncopal episode; rectal cancer surveillance study    Order Specific Question:   If indicated for the ordered procedure, I authorize the administration of contrast media per Radiology protocol    Answer:   Yes     Order Specific Question:   Is patient pregnant?    Answer:   No    Order Specific Question:   Preferred imaging location?    Answer:   [facility]    Order Specific Question:   Radiology Contrast Protocol - do NOT remove file path    Answer:   \\[email or web address]  . Iron and TIBC    Standing Status:   Standing    Number of Occurrences:   2    Standing Expiration Date:   01/20/2020  . Ferritin    Standing Status:   Standing    Number of Occurrences:   2    Standing Expiration Date:   01/20/2020   All questions were answered. The patient knows to call the clinic with any problems, questions or concerns. No barriers to learning was detected. I spent 20 minutes counseling the patient face to face. The total time spent in the appointment was 25 minutes and more than 50% was on counseling and review of test results     Deem Marmol K  Kalman Shan, NP 01/20/19

## 2019-01-20 NOTE — Telephone Encounter (Signed)
TCT patient and reviewed iron studies with her. Per Cira Rue, NP pt is to start iron supplement daily, or every other day if pt cannot tolerate daily. Also advised that Lacie. NP ordered surveillance CT scan. Advised that radiology scheduling will be contacting her to schedule.  Pt voiced understanding to the above.  No further questions or concerns

## 2019-01-21 ENCOUNTER — Telehealth: Payer: Self-pay

## 2019-01-21 NOTE — Telephone Encounter (Signed)
Left Voicemail for patient to call back [place].

## 2019-01-21 NOTE — Telephone Encounter (Signed)
TC to patient per Lacie to let her know her iron level. I let her know that since she is having some bleeding Lacie suggest she take 1 iron tab every day or every other day if she can tolerate. Patient stated that she cannot tolerate taking any iron tablets but she is taking 1 multivitamin daily and that does have iron in it.  I also let her know that Lacie ordered surveillance CT and that scheduling will be calling her about that. Patient verbalized understanding. No further problems or concerns at this time.

## 2019-01-22 ENCOUNTER — Telehealth: Payer: Self-pay

## 2019-01-22 NOTE — Telephone Encounter (Signed)
-----   Message from Truitt Merle, MD sent at 01/21/2019  8:04 AM EDT ----- Please let pt know her lab results, CBC, CMP and CEA all WNL except mild board line anemia, no concerns, thanks   Truitt Merle  01/21/2019

## 2019-01-22 NOTE — Telephone Encounter (Signed)
Left voice message for patient regarding lab results, per Dr. Burr Medico in formed her CBC, CMP and CEA all WNL except she is borderline anemic, no concerns.

## 2019-02-02 ENCOUNTER — Other Ambulatory Visit: Payer: Self-pay

## 2019-02-02 ENCOUNTER — Encounter (HOSPITAL_COMMUNITY): Payer: Self-pay

## 2019-02-02 ENCOUNTER — Ambulatory Visit (HOSPITAL_COMMUNITY)
Admission: RE | Admit: 2019-02-02 | Discharge: 2019-02-02 | Disposition: A | Discharge status: Home / Self Care | Payer: Medicare Other | Source: Ambulatory Visit | Attending: Nurse Practitioner | Admitting: Nurse Practitioner

## 2019-02-02 DIAGNOSIS — C20 Malignant neoplasm of rectum: Secondary | ICD-10-CM | POA: Diagnosis not present

## 2019-02-02 MED ORDER — IOHEXOL 300 MG/ML  SOLN
100.0000 mL | Freq: Once | INTRAMUSCULAR | Status: AC | PRN
  Administered 2019-02-02: 100 mL via INTRAVENOUS

## 2019-02-02 MED ORDER — IOHEXOL 300 MG/ML  SOLN
30.0000 mL | Freq: Once | INTRAMUSCULAR | Status: AC | PRN
  Administered 2019-02-02: 30 mL via ORAL

## 2019-02-02 MED ORDER — SODIUM CHLORIDE (PF) 0.9 % IJ SOLN
INTRAMUSCULAR | Status: AC
  Filled 2019-02-02: qty 50

## 2019-02-05 ENCOUNTER — Telehealth: Payer: Self-pay

## 2019-02-05 NOTE — Telephone Encounter (Signed)
-----   Message from Alla Feeling, NP sent at 02/04/2019  5:18 PM EDT ----- Please let her know surveillance CT was negative for recurrence, no signs of cancer.  Thanks, Regan Rakers

## 2019-02-05 NOTE — Telephone Encounter (Signed)
Spoke with pt advised per Cira Rue NP, surveillance CT was negative for recurrence, no sign of cancer. Pt verbalized understanding.

## 2019-04-15 ENCOUNTER — Ambulatory Visit: Payer: Medicare Other | Admitting: Cardiology

## 2019-04-22 ENCOUNTER — Inpatient Hospital Stay: Payer: Medicare Other | Attending: Nurse Practitioner

## 2019-04-22 ENCOUNTER — Other Ambulatory Visit: Payer: Self-pay | Admitting: Neurological Surgery

## 2019-04-22 ENCOUNTER — Telehealth: Payer: Self-pay | Admitting: Nurse Practitioner

## 2019-04-22 DIAGNOSIS — S32009K Unspecified fracture of unspecified lumbar vertebra, subsequent encounter for fracture with nonunion: Secondary | ICD-10-CM

## 2019-04-22 NOTE — Telephone Encounter (Signed)
Phone call to patient to verify medication list and allergies for myelogram procedure. Pt instructed to hold Cymbalta for 48hrs prior to myelogram appointment time. Pt verbalized understanding. Pre and post procedure instructions reviewed with pt.

## 2019-04-30 ENCOUNTER — Other Ambulatory Visit: Payer: Medicare Other

## 2019-04-30 ENCOUNTER — Inpatient Hospital Stay: Admission: RE | Admit: 2019-04-30 | Payer: Medicare Other | Source: Ambulatory Visit

## 2019-05-21 ENCOUNTER — Other Ambulatory Visit: Payer: Medicare Other

## 2019-06-04 ENCOUNTER — Other Ambulatory Visit: Payer: Self-pay | Admitting: Hematology

## 2019-06-04 ENCOUNTER — Telehealth: Payer: Self-pay

## 2019-06-04 DIAGNOSIS — C20 Malignant neoplasm of rectum: Secondary | ICD-10-CM

## 2019-06-04 NOTE — Telephone Encounter (Signed)
Received a call from Tammy at Dr. Zigmund Daniel office at [facility].  The patient is experiencing increased pelvic pail and Dr. Zigmund Daniel is wondering if Dr. Burr Medico would perhaps get a CT scan sooner.    Explained I would let Dr. Burr Medico know.  Patient is not scheduled to see [place] again until 07/23/2019.

## 2019-06-04 NOTE — Telephone Encounter (Signed)
I ordered CT ABD/PEL with contrast to be done in 2 weeks. Darlena, please get her insurance approval.   Malachy Mood, please let pt know and schedule lab, CT and f/u a few days after, thanks   Truitt Merle MD

## 2019-06-09 ENCOUNTER — Telehealth: Payer: Self-pay | Admitting: Hematology

## 2019-06-09 NOTE — Telephone Encounter (Signed)
Scheduled appt per 11/25 sch message - pt is aware of appt date and time

## 2019-06-18 ENCOUNTER — Ambulatory Visit (HOSPITAL_COMMUNITY)
Admission: RE | Admit: 2019-06-18 | Discharge: 2019-06-18 | Disposition: A | Discharge status: Home / Self Care | Payer: 59 | Source: Ambulatory Visit | Attending: Hematology | Admitting: Hematology

## 2019-06-18 ENCOUNTER — Inpatient Hospital Stay: Payer: 59 | Attending: Nurse Practitioner

## 2019-06-18 ENCOUNTER — Telehealth (HOSPITAL_BASED_OUTPATIENT_CLINIC_OR_DEPARTMENT_OTHER): Payer: 59 | Admitting: Hematology

## 2019-06-18 ENCOUNTER — Encounter: Payer: Self-pay | Admitting: Hematology

## 2019-06-18 ENCOUNTER — Other Ambulatory Visit: Payer: Self-pay

## 2019-06-18 DIAGNOSIS — N823 Fistula of vagina to large intestine: Secondary | ICD-10-CM | POA: Diagnosis not present

## 2019-06-18 DIAGNOSIS — R102 Pelvic and perineal pain: Secondary | ICD-10-CM | POA: Diagnosis not present

## 2019-06-18 DIAGNOSIS — B998 Other infectious disease: Secondary | ICD-10-CM | POA: Diagnosis not present

## 2019-06-18 DIAGNOSIS — K5900 Constipation, unspecified: Secondary | ICD-10-CM | POA: Diagnosis not present

## 2019-06-18 DIAGNOSIS — G629 Polyneuropathy, unspecified: Secondary | ICD-10-CM | POA: Diagnosis not present

## 2019-06-18 DIAGNOSIS — Z808 Family history of malignant neoplasm of other organs or systems: Secondary | ICD-10-CM | POA: Insufficient documentation

## 2019-06-18 DIAGNOSIS — N898 Other specified noninflammatory disorders of vagina: Secondary | ICD-10-CM | POA: Insufficient documentation

## 2019-06-18 DIAGNOSIS — Z800 Family history of malignant neoplasm of digestive organs: Secondary | ICD-10-CM | POA: Insufficient documentation

## 2019-06-18 DIAGNOSIS — Z933 Colostomy status: Secondary | ICD-10-CM | POA: Diagnosis not present

## 2019-06-18 DIAGNOSIS — Z9049 Acquired absence of other specified parts of digestive tract: Secondary | ICD-10-CM | POA: Diagnosis not present

## 2019-06-18 DIAGNOSIS — R252 Cramp and spasm: Secondary | ICD-10-CM | POA: Insufficient documentation

## 2019-06-18 DIAGNOSIS — Z9221 Personal history of antineoplastic chemotherapy: Secondary | ICD-10-CM | POA: Diagnosis not present

## 2019-06-18 DIAGNOSIS — Z803 Family history of malignant neoplasm of breast: Secondary | ICD-10-CM | POA: Diagnosis not present

## 2019-06-18 DIAGNOSIS — M8580 Other specified disorders of bone density and structure, unspecified site: Secondary | ICD-10-CM | POA: Diagnosis not present

## 2019-06-18 DIAGNOSIS — I10 Essential (primary) hypertension: Secondary | ICD-10-CM | POA: Diagnosis not present

## 2019-06-18 DIAGNOSIS — R32 Unspecified urinary incontinence: Secondary | ICD-10-CM | POA: Diagnosis not present

## 2019-06-18 DIAGNOSIS — D649 Anemia, unspecified: Secondary | ICD-10-CM | POA: Diagnosis not present

## 2019-06-18 DIAGNOSIS — Z79899 Other long term (current) drug therapy: Secondary | ICD-10-CM | POA: Diagnosis not present

## 2019-06-18 DIAGNOSIS — Z85048 Personal history of other malignant neoplasm of rectum, rectosigmoid junction, and anus: Secondary | ICD-10-CM | POA: Diagnosis not present

## 2019-06-18 DIAGNOSIS — Z923 Personal history of irradiation: Secondary | ICD-10-CM | POA: Diagnosis not present

## 2019-06-18 DIAGNOSIS — C20 Malignant neoplasm of rectum: Secondary | ICD-10-CM

## 2019-06-18 DIAGNOSIS — Z90710 Acquired absence of both cervix and uterus: Secondary | ICD-10-CM | POA: Insufficient documentation

## 2019-06-18 DIAGNOSIS — M545 Low back pain: Secondary | ICD-10-CM | POA: Insufficient documentation

## 2019-06-18 LAB — CBC WITH DIFFERENTIAL/PLATELET
Abs Immature Granulocytes: 0.03 10*3/uL (ref 0.00–0.07)
Basophils Absolute: 0.1 10*3/uL (ref 0.0–0.1)
Basophils Relative: 1 %
Eosinophils Absolute: 0.2 10*3/uL (ref 0.0–0.5)
Eosinophils Relative: 3 %
HCT: 37.4 % (ref 36.0–46.0)
Hemoglobin: 11.7 g/dL — ABNORMAL LOW (ref 12.0–15.0)
Immature Granulocytes: 0 %
Lymphocytes Relative: 17 %
Lymphs Abs: 1.3 10*3/uL (ref 0.7–4.0)
MCH: 27.1 pg (ref 26.0–34.0)
MCHC: 31.3 g/dL (ref 30.0–36.0)
MCV: 86.8 fL (ref 80.0–100.0)
Monocytes Absolute: 0.5 10*3/uL (ref 0.1–1.0)
Monocytes Relative: 6 %
Neutro Abs: 5.3 10*3/uL (ref 1.7–7.7)
Neutrophils Relative %: 73 %
Platelets: 298 10*3/uL (ref 150–400)
RBC: 4.31 MIL/uL (ref 3.87–5.11)
RDW: 13.4 % (ref 11.5–15.5)
WBC: 7.3 10*3/uL (ref 4.0–10.5)
nRBC: 0 % (ref 0.0–0.2)

## 2019-06-18 LAB — CMP (CANCER CENTER ONLY)
ALT: 14 U/L (ref 0–44)
AST: 14 U/L — ABNORMAL LOW (ref 15–41)
Albumin: 3.6 g/dL (ref 3.5–5.0)
Alkaline Phosphatase: 93 U/L (ref 38–126)
Anion gap: 8 (ref 5–15)
BUN: 15 mg/dL (ref 6–20)
CO2: 28 mmol/L (ref 22–32)
Calcium: 9.7 mg/dL (ref 8.9–10.3)
Chloride: 104 mmol/L (ref 98–111)
Creatinine: 0.71 mg/dL (ref 0.44–1.00)
GFR, Est AFR Am: >60 mL/min (ref >60)
GFR, Estimated: >60 mL/min (ref >60)
Glucose, Bld: 102 mg/dL — ABNORMAL HIGH (ref 70–99)
Potassium: 4.3 mmol/L (ref 3.5–5.1)
Sodium: 140 mmol/L (ref 135–145)
Total Bilirubin: 0.3 mg/dL (ref 0.3–1.2)
Total Protein: 7.4 g/dL (ref 6.5–8.1)

## 2019-06-18 LAB — IRON AND TIBC
Iron: 59 ug/dL (ref 28–170)
Saturation Ratios: 17 % (ref 10.4–31.8)
TIBC: 347 ug/dL (ref 250–450)
UIBC: 288 ug/dL

## 2019-06-18 LAB — FERRITIN: Ferritin: 53 ng/mL (ref 11–307)

## 2019-06-18 MED ORDER — IOHEXOL 9 MG/ML PO SOLN
500.0000 mL | ORAL | Status: AC
  Administered 2019-06-18: 500 mL via ORAL

## 2019-06-18 MED ORDER — IOHEXOL 300 MG/ML  SOLN
100.0000 mL | Freq: Once | INTRAMUSCULAR | Status: AC | PRN
  Administered 2019-06-18: 100 mL via INTRAVENOUS

## 2019-06-18 MED ORDER — IOHEXOL 9 MG/ML PO SOLN
ORAL | Status: AC
  Filled 2019-06-18: qty 1000

## 2019-06-18 MED ORDER — SODIUM CHLORIDE (PF) 0.9 % IJ SOLN
INTRAMUSCULAR | Status: AC
  Filled 2019-06-18: qty 50

## 2019-06-18 NOTE — Progress Notes (Signed)
[facility]   Telephone:([phone]) [phone] Fax:(336) [phone]   Clinic Follow up Note   Patient Care Team: [name], MD as PCP - General (Family Medicine) Jerline Pain, MD as PCP - Cardiology (Cardiology) Michael Boston, MD as Consulting Physician (General Surgery) Truitt Merle, MD as Consulting Physician (Medical Oncology) Kyung Rudd, MD as Consulting Physician (Radiation Oncology) Teena Irani, MD (Inactive) as Consulting Physician (Gastroenterology) Raynelle Bring, MD as Consulting Physician (Urology) Eustace Moore, MD as Consulting Physician (Neurosurgery)   I connected with [name] on 06/18/2019 at  4:00 PM EST by video enabled telemedicine visit and verified that I am speaking with the correct person using two identifiers.  I discussed the limitations, risks, security and privacy concerns of performing an evaluation and management service by telephone and the availability of in person appointments. I also discussed with the patient that there may be a patient responsible charge related to this service. The patient expressed understanding and agreed to proceed.   Other persons participating in the visit and their role in the encounter:  Her husband  Patient's location:  Her home  Provider's location:  My Office   CHIEF COMPLAINT: F/u on rectal cancer  SUMMARY OF ONCOLOGIC HISTORY: Oncology History Overview Note  Rectal adenocarcinoma   Staging form: Colon and Rectum, AJCC 7th Edition     Clinical: T3, N2, M0 - Unsigned     Rectal adenocarcinoma s/p LAR resection 05/25/2015  01/13/2015 Initial Diagnosis   Rectal adenocarcinoma   01/13/2015 Procedure   Colonoscopy showed a sensitivity O nonobstructing mass in the rectum and from 12-18 cm proximal to the Annis. The mass was circumferential, measuring about 6 cm in length. EGD was negative.   01/13/2015 Cancer Staging   Staging form: Colon and Rectum, AJCC 7th Edition - Clinical stage from 01/13/2015: T3,  N2, M0 - Signed by Truitt Merle, MD on 12/15/2017   01/17/2015 Tumor Marker   CEA 1.4, CA-19-9 8, MMR normal    01/18/2015 Procedure   Lower endoscopic ultrasound by Dr. Paulita Fujita showed a T3 N2 rectal mass.   01/20/2015 Imaging   CT abdomen and pelvis with contrast showed right lateral rectal wall exophytic mass, and 2 ill-defined hepatic hypoenhancing lesions which appears to correspond to the lesion seen on the prior MRI in 2015.   02/05/2015 Imaging   abdomen MRI showed 2 hemangioma, no suspicion for metastatic disease. CT chest was negative   02/20/2015 - 03/30/2015 Radiation Therapy   neoadjuvant RT to rectal cancer    02/20/2015 Concurrent Chemotherapy   capecitabine 2500 mg in the morning and 2000 mg in the evening (848m/m2, bid), on the day of radiation.    05/25/2015 Surgery   Recto-sigmoid segmental resection, margins are negative    05/25/2015 Pathology Results   0.2cm residual invasive adenocarcinoma, G2, negative margins, 12 nodes were negative    05/25/2015 Cancer Staging   Staging form: Colon and Rectum, AJCC 7th Edition - Pathologic stage from 05/25/2015: Stage I (T1, N0, cM0) - Signed by FTruitt Merle MD on 12/15/2017   06/23/2015 - 06/29/2015 Hospital Admission   Patient was admitted for pelvic abscess, drain placed and she was treated with IV antibiotics, she also received 1 dose of Feraheme for anemia.   09/15/2015 - 09/17/2015 Hospital Admission   Recurrent pelvic collection s/p perc drainage 09/16/2015   02/21/2016 Imaging   CT abdomen and pelvis w contrast IMPRESSION: 1. Interval removal of surgical drains since 12/19/2015 from the presacral space. Similar  size of ill-defined presacral fluid and gas, for which residual abscess cannot be excluded. Similar amount of intraperitoneal edema throughout the upper pelvis with foci of extraluminal gas, possibly related to the presacral process. 2. Diverting right-sided ileostomy, without acute complication. 3. Similar moderate  hydroureteronephrosis, likely due to the pelvic process. 4. Possible bladder wall thickening. Correlate with urinalysis. This appearance could be partially due to underdistention. 5. Geographic hepatic steatosis and hemangiomas. Cannot exclude a new right hepatic lobe lesion. Consider nonemergent pre and post contrast outpatient MRI. This could represent a new area of focal steatosis.   05/31/2016 Surgery   ileostomy takedown BY Dr. Johney Maine    07/04/2016 Imaging   CT Abdomen Pelvis W Contrast 07/04/16 IMPRESSION: Subcutaneous fluid collection at the site of ileostomy takedown, could be sterile or infected, measuring 9.3 x 4.3 x 5.3 cm; this could be aspirated under ultrasound guidance if clinically indicated. Large collection of stool within the presacral space, could potentially be within a distended distorted rectum but since this occupies the same position as the abscess collection identified on the previous CT this is suspicious for a contiguous contained extraluminal stool collection communicating with the rectosigmoid colon, collection overall roughly 7 x 8 x 9 cm.   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   12/26/2016 Imaging   IMPRESSION: 1. Stable CT exam of the chest.  No evidence for metastatic disease. 2. Interval left colectomy with left abdominal end colostomy. Presacral soft tissue is similar to prior study when taking into account the interval surgery. 3. Hepatic dome lesion seen on the previous study not evident today, potentially related to bolus timing. The 2.4 cm subcapsular inferior right liver lesion remains subtle and is barely visible on today's exam but shows no substantial interval change in size. Continued attention on follow-up recommended.    12/09/2017 Imaging   CT CAP W Contrast 12/09/17  IMPRESSION: 1. Stable post treatment changes in the presacral space with  no evidence of recurrent disease in the pelvis. 2. No evidence of metastatic disease in the chest, abdomen or pelvis. 3. Large parastomal hernia has increased in size and contains much of the remnant left colon. No evidence of bowel obstruction or ischemia. 4. Stable dilated main pulmonary artery, suggesting chronic pulmonary arterial hypertension. 5.  Aortic Atherosclerosis (ICD10-I70.0).   12/31/2017 Genetic Testing   The Multi-Cancer Panel offered by [facility] includes sequencing and/or deletion duplication testing of the following 83 genes: ALK, APC, ATM, AXIN2,BAP1,  BARD1, BLM, BMPR1A, BRCA1, BRCA2, BRIP1, CASR, CDC73, CDH1, CDK4, CDKN1B, CDKN1C, CDKN2A (p14ARF), CDKN2A (p16INK4a), CEBPA, CHEK2, CTNNA1, DICER1, DIS3L2, EGFR (c.2369C>T, p.Thr790Met variant only), EPCAM (Deletion/duplication testing only), FH, FLCN, GATA2, GPC3, GREM1 (Promoter region deletion/duplication testing only), HOXB13 (c.251G>A, p.Gly84Glu), HRAS, KIT, MAX, MEN1, MET, MITF (c.952G>A, p.Glu318Lys variant only), MLH1, MSH2, MSH3, MSH6, MUTYH, NBN, NF1, NF2, NTHL1, PALB2, PDGFRA, PHOX2B, PMS2, POLD1, POLE, POT1, PRKAR1A, PTCH1, PTEN, RAD50, RAD51C, RAD51D, RB1, RECQL4, RET, RUNX1, SDHAF2, SDHA (sequence changes only), SDHB, SDHC, SDHD, SMAD4, SMARCA4, SMARCB1, SMARCE1, STK11, SUFU, TERC, TERT, TMEM127, TP53, TSC1, TSC2, VHL, WRN and WT1.   Results: Negative, no pathogenic variants identified.  The date of this test report is 12/31/2017.    02/02/2019 Imaging   CT CAP W Contrast  IMPRESSION: 1. Status post descending colostomy, without recurrent or metastatic disease. 2. Presacral soft tissue thickening is similar in configuration. New tiny foci of gas within the presacral soft tissues, nonspecific. No  well-defined fistulous communication to bowel identified. 3. Possible constipation. 4. Probable mild hepatic steatosis. 5. Interval repair of parastomal hernia.   Aortic Atherosclerosis (ICD10-I70.0).   06/18/2019  Imaging   CT AP W Contrast  IMPRESSION: 1. Stable post treatment changes in presacral region. No evidence of recurrent or metastatic carcinoma within the abdomen or pelvis. 2. New osteolysis involving the superior and inferior vertebral endplates at G2-9, suspicious for infectious discitis. Recommend clinical correlation, and consider lumbar spine MRI without and with contrast for further evaluation.   These results will be called to the ordering clinician or representative by the Radiologist Assistant, and communication documented in the PACS or zVision Dashboard.       CURRENT THERAPY:  Surveillance  INTERVAL HISTORY:  Lindsay Little is here for a follow up of rectal cancer. They identified themselves by face to face video.  She has had back and pelvic pain since surgery. She notes this has only worsened to 9/10 in the recent 5 months. She has been taking Norco 335m at least 5 times a day. Her pain improved when laying back. She can take care of herself but does not walk for long.  She notes she is having vaginal and rectal yellow/bloody draining from her surgery. She wears depends to catch this and changed it 1-2 times a day. Her surgeon suggested another surgery to close up the rest of her rectum. She has not talked to Dr. GJohney Maineabout this yet.    REVIEW OF SYSTEMS:   Constitutional: Denies fevers, chills or abnormal weight loss Eyes: Denies blurriness of vision Ears, nose, mouth, throat, and face: Denies mucositis or sore throat Respiratory: Denies cough, dyspnea or wheezes Cardiovascular: Denies palpitation, chest discomfort or lower extremity swelling Gastrointestinal:  Denies nausea, heartburn or change in bowel habits (+) vaginal and rectal yellow/bloody draining  Skin: Denies abnormal skin rashes Lymphatics: Denies new lymphadenopathy or easy bruising Neurological:Denies numbness, tingling or new weaknesses Behavioral/Psych: Mood is stable, no new changes  All other  systems were reviewed with the patient and are negative.  MEDICAL HISTORY:  Past Medical History:  Diagnosis Date   Anxiety    Chemotherapy-induced neuropathy (HCC)    toes and fingers numbness and tingling   Colorectal delayed anastomotic leak s/p resection & colostomy 07/12/2016 11/17/2015   Colostomy in place ([facility] 07/12/2016   due to anastomosis breakdown w/ colovaginal fistula   Colovaginal fistula s/p omentopexy repair 07/12/2016 152/84/1324  Complication of anesthesia    Depression    Family history of adverse reaction to anesthesia    mother-- ponv   Family history of breast cancer    Family history of pancreatic cancer    Family history of skin cancer    Gastroenteritis 12/20/2015   GERD (gastroesophageal reflux disease)    Hiatal hernia    History of cancer chemotherapy 02-20-2015 to 03-30-2015   History of cardiac murmur as a child    History of chemotherapy    History of chronic gastritis    History of hypertension    no issues since multiple abdminal sx's and chemo--- no medication since 12/ 2016   History of TMJ disorder    Hypercholesteremia    Hypertension    IBS (irritable bowel syndrome)    dx age 89  Intermittent abdominal pain    post-op multiple abdominal sx's    Microcytic anemia    Mild sleep apnea    per pt study 2014  very mild osa , no cpap  recommended, recommeded wt loss and sleep routine   OA (osteoarthritis)    left knee /  left shoulder   Obesity    PONV (postoperative nausea and vomiting)    severe" needs Scopolamine PATCH"    Portacath in place    right chest   Rectal adenocarcinoma ([place]) oncologis-  dr Burr Medico-- after radiation/ chemo (ypT1, N0) --  no recurrence per last note 03/ 2018   dx 01-13-2015-- Stage IIIC (T3, N2, M0) post concurrent radiation and chemotherapy 02-20-2015 to 03-30-2015 /  05-25-2015 s/p  LAR w/ RSO (post-op complicated by late abscess and contained anatomotic leak w/ help percutaneous  drainage and antibiotics)    Rotator cuff tear, left    S/P radiation therapy 02/20/15-03/30/15   colon/rectal   Vitamin D deficiency    Wears glasses    Wears glasses     SURGICAL HISTORY: Past Surgical History:  Procedure Laterality Date   ABDOMINAL HYSTERECTOMY  1996   uterus and cervix   BACK SURGERY  02/12/2018   lumbar surgery   COLON RESECTION N/A 07/12/2016   Procedure: LAPAROSCOPIC LYSIS OF ADHESIONS, OMENTOPEXY, HAND ASSISTED RESECTION OF  COLON, END TO END ANASTOMOSIS, COLOSTOMY;  Surgeon: Michael Boston, MD;  Location: [facility];  Service: General;  Laterality: N/A;   COMBINED HYSTEROSCOPY DIAGNOSTIC / D&C  x2 1990's   DIAGNOSTIC LAPAROSCOPY  age 5 and age 48   EUS N/A 01/18/2015   Procedure: LOWER ENDOSCOPIC ULTRASOUND (EUS);  Surgeon: Arta Silence, MD;  Location: [facility];  Service: Endoscopy;  Laterality: N/A;   EXCISION OF SKIN TAG  11/17/2015   Procedure: EXCISION OF SKIN TAG;  Surgeon: Michael Boston, MD;  Location: [facility];  Service: General;;   ILEO LOOP COLOSTOMY CLOSURE N/A 11/17/2015   Procedure: LAPAROSCOPIC DIVERTING LOOP ILEOSTOMY  DRAINAGE OF PELVIC ABSCESS;  Surgeon: Michael Boston, MD;  Location: [facility];  Service: General;  Laterality: N/A;   ILEOSTOMY CLOSURE N/A 05/31/2016   Procedure: TAKEDOWN LOOP ILEOSTOMY;  Surgeon: Michael Boston, MD;  Location: [facility];  Service: General;  Laterality: N/A;   IMPACTION REMOVAL  11/17/2015   Procedure: DISIMPACTION REMOVAL;  Surgeon: Michael Boston, MD;  Location: [facility];  Service: General;;   INSERTION OF MESH N/A 07/23/2018   Procedure: INSERTION OF MESH X2;  Surgeon: Michael Boston, MD;  Location: [facility];  Service: General;  Laterality: N/A;   KNEE ARTHROSCOPY Left 1990's   KNEE ARTHROSCOPY W/ MENISCECTOMY Left 09/14/2009   and chondroplasty debridement   LAPAROSCOPIC LYSIS OF ADHESIONS  11/17/2015   Procedure: LAPAROSCOPIC LYSIS OF ADHESIONS;  Surgeon: Michael Boston, MD;  Location: [facility];  Service: General;;    LUMBAR WOUND DEBRIDEMENT N/A 04/24/2018   Procedure: wound exploration, irrigation and debridement;  Surgeon: Eustace Moore, MD;  Location: [place];  Service: Neurosurgery;  Laterality: N/A;  wound exploration, irrigation and debridement   LYSIS OF ADHESION N/A 07/23/2018   Procedure: LYSIS OF ADHESIONS;  Surgeon: Michael Boston, MD;  Location: [facility];  Service: General;  Laterality: N/A;   PORT-A-CATH REMOVAL N/A 11/21/2016   Procedure: REMOVAL PORT-A-CATH;  Surgeon: Michael Boston, MD;  Location: [place];  Service: General;  Laterality: N/A;   PORTACATH PLACEMENT N/A 05/25/2015   Procedure: INSERTION PORT-A-CATH;  Surgeon: Michael Boston, MD;  Location: [facility];  Service: General;  Laterality: N/A;-remains inplace Right chest.   ROTATOR CUFF REPAIR Right 2006   TONSILLECTOMY  age 53   [place] N/A 07/23/2018   Procedure: LAPAROSCOPIC  VENTRAL WALL HERNIA REPAIR;  Surgeon: Michael Boston, MD;  Location: [facility];  Service: General;  Laterality: N/A;   XI ROBOTIC ASSISTED LOWER ANTERIOR RESECTION N/A 05/25/2015   Procedure: XI ROBOTIC ASSISTED LOWER ANTERIOR RESECTION, , RIGID PROCTOSCOPY, RIGHT OOPHORECTOMY;  Surgeon: Michael Boston, MD;  Location: [facility];  Service: General;  Laterality: N/A;    I have reviewed the social history and family history with the patient and they are unchanged from previous note.  ALLERGIES:  is allergic to caine-1 [lidocaine]; sulfa antibiotics; adhesive [tape]; doxycycline; flagyl [metronidazole]; iron; oxycodone; and penicillins.  MEDICATIONS:  Current Outpatient Medications  Medication Sig Dispense Refill   Cholecalciferol (VITAMIN D3) 5000 units CAPS Take 5,000 Units by mouth at bedtime.      Coenzyme Q10 (COQ10) 200 MG CAPS Take 200 mg by mouth at bedtime.     DULoxetine (CYMBALTA) 60 MG capsule Take 120 mg by mouth at bedtime.      esomeprazole (NEXIUM) 40 MG capsule Take 1 capsule (40 mg total) by mouth 2 (two) times daily before  a meal. 60 capsule 5   furosemide (LASIX) 20 MG tablet Take 20 mg by mouth daily as needed for edema.      gabapentin (NEURONTIN) 300 MG capsule Take 900 mg by mouth 4 (four) times daily.     HYDROcodone-acetaminophen (NORCO) 10-325 MG tablet Take 1-2 tablets by mouth every 6 (six) hours as needed for moderate pain or severe pain. 40 tablet 0   Melatonin 3 MG TABS Take 3-6 mg by mouth at bedtime.      meloxicam (MOBIC) 15 MG tablet Take 15 mg by mouth at bedtime.      Multiple Vitamin (MULTIVITAMIN WITH MINERALS) TABS tablet Take 2 tablets by mouth at bedtime.      nebivolol (BYSTOLIC) 5 MG tablet Take 10 mg by mouth at bedtime.      polyethylene glycol (MIRALAX / GLYCOLAX) packet Take 17 g by mouth daily as needed for moderate constipation.     rosuvastatin (CRESTOR) 5 MG tablet Take 5 mg by mouth at bedtime.     tiZANidine (ZANAFLEX) 4 MG tablet Take 4 mg by mouth 3 (three) times daily as needed for muscle spasms.   2   triamcinolone lotion (KENALOG) 0.1 % Apply 1 application topically as needed (dry skin).     No current facility-administered medications for this visit.    Facility-Administered Medications Ordered in Other Visits  Medication Dose Route Frequency Provider Last Rate Last Dose   0.9 %  sodium chloride infusion   Intravenous Once Truitt Merle, MD        PHYSICAL EXAMINATION: ECOG PERFORMANCE STATUS: 3 - Symptomatic, >50% confined to bed  No vitals taken today, Exam not performed today   LABORATORY DATA:  I have reviewed the data as listed CBC Latest Ref Rng & Units 06/18/2019 01/20/2019 07/21/2018  WBC 4.0 - 10.5 K/uL 7.3 6.5 7.7  Hemoglobin 12.0 - 15.0 g/dL 11.7(L) 11.8(L) 11.4(L)  Hematocrit 36.0 - 46.0 % 37.4 37.7 36.6  Platelets 150 - 400 K/uL 298 258 302     CMP Latest Ref Rng & Units 06/18/2019 01/20/2019 07/21/2018  Glucose 70 - 99 mg/dL 102(H) 110(H) 104(H)  BUN 6 - 20 mg/dL 15 12 11   Creatinine 0.44 - 1.00 mg/dL 0.71 0.72 0.72  Sodium 135 - 145 mmol/L  140 140 141  Potassium 3.5 - 5.1 mmol/L 4.3 4.2 4.2  Chloride 98 - 111 mmol/L 104 106 106  CO2 22 -  32 mmol/L 28 26 27   Calcium 8.9 - 10.3 mg/dL 9.7 9.4 9.9  Total Protein 6.5 - 8.1 g/dL 7.4 7.1 7.2  Total Bilirubin 0.3 - 1.2 mg/dL 0.3 0.4 0.3  Alkaline Phos 38 - 126 U/L 93 93 100  AST 15 - 41 U/L 14(L) 12(L) 13(L)  ALT 0 - 44 U/L 14 17 18       RADIOGRAPHIC STUDIES: I have personally reviewed the radiological images as listed and agreed with the findings in the report. Ct Abdomen Pelvis W Contrast  Result Date: 06/18/2019 CLINICAL DATA:  Follow-up rectal carcinoma. EXAM: CT ABDOMEN AND PELVIS WITH CONTRAST TECHNIQUE: Multidetector CT imaging of the abdomen and pelvis was performed using the standard protocol following bolus administration of intravenous contrast. CONTRAST:  133m OMNIPAQUE IOHEXOL 300 MG/ML  SOLN COMPARISON:  02/02/2019 FINDINGS: Lower Chest: No acute findings. Hepatobiliary: No hepatic masses identified. Gallbladder is unremarkable. No evidence of biliary ductal dilatation. Pancreas:  No mass or inflammatory changes. Spleen: Within normal limits in size and appearance. Adrenals/Urinary Tract: No masses identified. No evidence of hydronephrosis. Stomach/Bowel: Postop changes from prior abdominoperitoneal resection and left upper quadrant colostomy are again seen. Stable concave soft tissue density in the presacral region is consistent with post treatment changes. No masses identified. No evidence of bowel obstruction, inflammatory process, or abnormal fluid collections. Vascular/Lymphatic: Sub-cm retroperitoneal lymph nodes in the left paraaortic, aortocaval and bilateral common iliac chains remains stable. No pathologically enlarged lymph nodes identified. No abdominal aortic aneurysm. Reproductive: Prior hysterectomy noted. Adnexal regions are unremarkable in appearance. Other:  None. Musculoskeletal: No evidence of bone metastases. Prior lumbar spine fusion hardware seen at L3-4  and L4-5. New osteolysis is seen involving the superior and inferior vertebral endplates at LE5-2 which is new since recent study. This is suspicious for infectious discitis. IMPRESSION: 1. Stable post treatment changes in presacral region. No evidence of recurrent or metastatic carcinoma within the abdomen or pelvis. 2. New osteolysis involving the superior and inferior vertebral endplates at LD7-8 suspicious for infectious discitis. Recommend clinical correlation, and consider lumbar spine MRI without and with contrast for further evaluation. These results will be called to the ordering clinician or representative by the Radiologist Assistant, and communication documented in the PACS or zVision Dashboard. Electronically Signed   By: JMarlaine HindM.D.   On: 06/18/2019 11:49     ASSESSMENT & PLAN:  DMALLEY HAUTERis a 53y.o. female with   1. Rectal adenocarcinoma T3N2M0, Stage IIIC , ypT1N0 after neoadjuvant chemotherapy and radiation -Diagnosed in 2016. Treated with surgery with neoadjuvant chemo and radiation. She is currently on surveillance.  -Since surgery she has had back and pelvic pain but this has been tolerable, until the last 5 months and has required pain medication.   -We discussed her CT AP from 06/18/19 which shows no evidence of malignancy. She does have bone loss between L3-L4. The radiologist is concerned of infection there and recommends lumbar MRI.  -She also has had yellow/bloody draining of her vagina and rectum for at least the past year. [facility] to her high rectovaginal fistular  -Due to her worsening pelvic and low back pain, I obtained a CT of abdomen and pelvis with contrast on June 18, 2019, I reviewed the images in person and discussed with patient.  No evidence of cancer recurrence on CT, however the new osteolysis involving the superior and inferior vertebral endplates at LE4-2 suspicious for  infectious  discitis. I will obtain a lumbar MRI for further evaluation.  Patient agrees. -I will call her after her MRI    2. Lower back pain, Rectal and Vaginal discharge  -She is currently on Norco 10-375m up to 5 times a day.  -per Dr. MRodman Key she has a high rectovaginal fistula.  She has persistent discharge from rectal and vaginal, she wears depends and has to change it 1-2 times daily.  -f/u with Dr. MRodman Keyand Dr. GJohney Maine  3. Bilateral Leg muscle cramps and neuropathy in feet -Currently on Gabapentin and Cymbalta with Meloxicam and Tramadol. -She will f/u with PCP, orthopedics and neurologist.  4. HTN -She'll continue follow-up with her primary care physician   Plan  -Lumbar MRI with and without contrast in 1 to 2 weeks, I will call her after the scan -will copy Dr. GJohney Maine   No problem-specific Assessment & Plan notes found for this encounter.   Orders Placed This Encounter  Procedures   MR Lumbar Spine W Wo Contrast    L3-4 discitis suspected on CT    Standing Status:   Future    Standing Expiration Date:   08/18/2020    Order Specific Question:   If indicated for the ordered procedure, I authorize the administration of contrast media per Radiology protocol    Answer:   Yes    Order Specific Question:   What is the patient's sedation requirement?    Answer:   No Sedation    Order Specific Question:   Does the patient have a pacemaker or implanted devices?    Answer:   No    Order Specific Question:   Use SRS Protocol?    Answer:   No    Order Specific Question:   Radiology Contrast Protocol - do NOT remove file path    Answer:   [email or web address]    Order Specific Question:   [facility]   I discussed the assessment and treatment plan with the patient. The patient was provided an opportunity to ask questions and all were answered. The patient agreed with the plan and  demonstrated an understanding of the instructions.  The patient was advised to call back or seek an in-person evaluation if the symptoms worsen or if the condition fails to improve as anticipated.  I provided 25 minutes of face-to-face video visit time during this encounter, and > 50% was spent counseling as documented under my assessment & plan.    YTruitt Merle MD 06/18/2019   I, AJoslyn Devon am acting as scribe for YTruitt Merle MD.   I have reviewed the above documentation for accuracy and completeness, and I agree with the above.

## 2019-06-19 ENCOUNTER — Encounter: Payer: Self-pay | Admitting: Hematology

## 2019-06-21 ENCOUNTER — Telehealth: Payer: Self-pay

## 2019-06-21 ENCOUNTER — Ambulatory Visit: Payer: Medicare Other | Admitting: Hematology

## 2019-06-21 LAB — CEA (IN HOUSE-CHCC): CEA (CHCC-In House): 1.55 ng/mL (ref 0.00–5.00)

## 2019-06-21 NOTE — Telephone Encounter (Signed)
Left voice message for patient that we have scheduled her Lumbar MRI for this Saturday 06/26/2019 at 11:00 to arrive by 10:30 at [facility] radiology.  Instructed her to enter through the ED and they will direct her where to go.  She was also given the phone number for Central Scheduling if this time is not suitable.

## 2019-06-22 ENCOUNTER — Encounter: Payer: Self-pay | Admitting: Nurse Practitioner

## 2019-06-26 ENCOUNTER — Ambulatory Visit (HOSPITAL_COMMUNITY)
Admission: RE | Admit: 2019-06-26 | Discharge: 2019-06-26 | Disposition: A | Discharge status: Home / Self Care | Payer: 59 | Source: Ambulatory Visit | Attending: Hematology | Admitting: Hematology

## 2019-06-26 ENCOUNTER — Other Ambulatory Visit: Payer: Self-pay

## 2019-06-26 DIAGNOSIS — C20 Malignant neoplasm of rectum: Secondary | ICD-10-CM

## 2019-06-26 MED ORDER — GADOBUTROL 1 MMOL/ML IV SOLN
10.0000 mL | Freq: Once | INTRAVENOUS | Status: AC | PRN
  Administered 2019-06-26: 10 mL via INTRAVENOUS

## 2019-06-28 ENCOUNTER — Other Ambulatory Visit: Payer: Self-pay | Admitting: Hematology

## 2019-06-28 ENCOUNTER — Telehealth: Payer: Self-pay | Admitting: Hematology

## 2019-06-28 DIAGNOSIS — M4646 Discitis, unspecified, lumbar region: Secondary | ICD-10-CM

## 2019-06-29 ENCOUNTER — Encounter (HOSPITAL_COMMUNITY): Payer: Self-pay

## 2019-06-29 ENCOUNTER — Telehealth (HOSPITAL_COMMUNITY): Payer: Self-pay

## 2019-06-29 NOTE — Telephone Encounter (Signed)
-----   Message from Ronney Lion, [place] sent at 06/29/2019 10:45 AM EST ----- Lia Foyer,  I had Dr. Estanislado Pandy review. He has approved patient for an image-guided L3-L4 disc aspiration. Please send bone bx request form to Dr. Burr Medico (if requested, he is planning on bx L3). Please schedule and please let me know if you have any questions.  Thanks! Ally ----- Message ----- From: Danielle Dess Sent: 06/29/2019   8:15 AM EST To: Ronney Lion, PA-C  Ally,   Can you please have Dev review for possible disc aspiration/biopsy. Dr. Burr Medico from the cancer center wants it done this week if possible.   Thanks,  Lia Foyer

## 2019-06-29 NOTE — Progress Notes (Signed)
[name] Female, 53 y.o., 24-Sep-1965 MRN:  [id number] Phone:  [phone] Jerilynn Mages) PCP:  [name], MD Primary Cvg:  Medicare/Medicare Part A And B Next Appt With Radiology ([facility] 2) 07/01/2019 at 8:30 AM  RE: Biopsy Received: Yesterday Message Contents  Arne Cleveland, MD  Lenore Cordia      Ok   IR L3-4 disc aspiration    [facility]   Previous Messages  ----- Message -----  From: Lenore Cordia  Sent: 06/28/2019  4:33 PM EST  To: Ir Procedure Requests  Subject: Biopsy                      Procedure Requested: CT Biopsy    Reason for Procedure: abnormal L3-4 on CT and MRI, please attemp aspitation and biopsy including soft tissue and bone in L3-4 region,. Case approved by Dr. Laurence Ferrari today   Provider Requesting: Dr Burr Medico  Provider Number: [phone]/ [phone]    Other Info: Dr Burr Medico stated this exam is STAT and would like it this week.

## 2019-06-29 NOTE — Telephone Encounter (Signed)
I called pt and discussed her MRI findings. Due to the concern of infectious discitis at L3-4, and rule out malignant recurrence, I recommend biopsy. I discussed the case with IR Dr. Laurence Ferrari and he approved the biopsy. If outpt is not available in the next few days, I will admit pt to hospital for biopsy and treatment. Pt agrees with the plan, she denies any feer or chills, but her back pain is quite severe.   Truitt Merle  06/28/2019

## 2019-06-29 NOTE — Telephone Encounter (Signed)
Called to schedule aspiration, no answer, left vm. AW

## 2019-06-30 ENCOUNTER — Other Ambulatory Visit: Payer: Self-pay | Admitting: Hematology

## 2019-06-30 ENCOUNTER — Other Ambulatory Visit: Payer: Self-pay | Admitting: Radiology

## 2019-06-30 ENCOUNTER — Telehealth: Payer: Self-pay | Admitting: Hematology

## 2019-06-30 ENCOUNTER — Other Ambulatory Visit: Payer: Self-pay

## 2019-06-30 ENCOUNTER — Telehealth: Payer: Self-pay | Admitting: *Deleted

## 2019-06-30 DIAGNOSIS — M4646 Discitis, unspecified, lumbar region: Secondary | ICD-10-CM

## 2019-06-30 DIAGNOSIS — C20 Malignant neoplasm of rectum: Secondary | ICD-10-CM

## 2019-06-30 NOTE — Progress Notes (Signed)
Spoke with Caryl Pina in [place] at [facility] explained patient needs a PICC line placed, order is in, they will coordinate this as well.

## 2019-06-30 NOTE — Telephone Encounter (Signed)
Scheduled appt per 12/15 sch message - spoke with patient and she is aware of appt date and time

## 2019-06-30 NOTE — Telephone Encounter (Signed)
Patient has PICC placement scheduled for 12/17.  Unsure if home health has been set up, no first dose of antibiotic scheduled with short stay (not every home health agency is able to do a 1st dose of antibiotic in the home).    Will need these set up as well as orders for IV antibiotics and labs.  Please advise. Landis Gandy, RN    Tommy Medal, Lavell Islam, MD  Truitt Merle, MD; P [facility] Triage Nurse Pool  My cell is [phone] if you want we can try to arrange this in the outpatient world by having a PICC line put in with radiology and seeing her in clinic if you want to help [place] avoid overloading the hospital with patient's I will CC my staff here if she is being admitted please tell me which hospital so I can either see her at [facility] or Caren Griffins can see the patient at [facility]

## 2019-06-30 NOTE — Telephone Encounter (Signed)
After IR get cultures empiric antibiotics would be ceftriaxone 2 g Iv daily and vancomycin dosed per pharmacy w first dose being 1 g if I am coming up w regimen. Cassie could help

## 2019-07-01 ENCOUNTER — Ambulatory Visit (HOSPITAL_COMMUNITY)
Admission: RE | Admit: 2019-07-01 | Discharge: 2019-07-01 | Disposition: A | Discharge status: Home / Self Care | Payer: 59 | Source: Ambulatory Visit | Attending: Hematology | Admitting: Hematology

## 2019-07-01 ENCOUNTER — Inpatient Hospital Stay: Payer: 59

## 2019-07-01 ENCOUNTER — Encounter: Payer: Self-pay | Admitting: Hematology

## 2019-07-01 ENCOUNTER — Telehealth: Payer: Self-pay | Admitting: Hematology

## 2019-07-01 ENCOUNTER — Other Ambulatory Visit: Payer: Self-pay | Admitting: Hematology

## 2019-07-01 ENCOUNTER — Inpatient Hospital Stay (HOSPITAL_BASED_OUTPATIENT_CLINIC_OR_DEPARTMENT_OTHER): Payer: 59 | Admitting: Hematology

## 2019-07-01 ENCOUNTER — Encounter (HOSPITAL_COMMUNITY): Payer: Self-pay

## 2019-07-01 ENCOUNTER — Inpatient Hospital Stay: Payer: 59 | Admitting: Hematology

## 2019-07-01 ENCOUNTER — Other Ambulatory Visit: Payer: Self-pay

## 2019-07-01 VITALS — BP 137/92 | HR 75 | Temp 97.8°F | Resp 17 | Ht 70.5 in | Wt 333.7 lb

## 2019-07-01 DIAGNOSIS — M545 Low back pain: Secondary | ICD-10-CM | POA: Diagnosis not present

## 2019-07-01 DIAGNOSIS — C20 Malignant neoplasm of rectum: Secondary | ICD-10-CM

## 2019-07-01 DIAGNOSIS — I10 Essential (primary) hypertension: Secondary | ICD-10-CM | POA: Insufficient documentation

## 2019-07-01 DIAGNOSIS — Z87891 Personal history of nicotine dependence: Secondary | ICD-10-CM | POA: Insufficient documentation

## 2019-07-01 DIAGNOSIS — F329 Major depressive disorder, single episode, unspecified: Secondary | ICD-10-CM | POA: Insufficient documentation

## 2019-07-01 DIAGNOSIS — M4646 Discitis, unspecified, lumbar region: Secondary | ICD-10-CM | POA: Insufficient documentation

## 2019-07-01 DIAGNOSIS — Z79899 Other long term (current) drug therapy: Secondary | ICD-10-CM | POA: Insufficient documentation

## 2019-07-01 DIAGNOSIS — K219 Gastro-esophageal reflux disease without esophagitis: Secondary | ICD-10-CM | POA: Insufficient documentation

## 2019-07-01 DIAGNOSIS — G4730 Sleep apnea, unspecified: Secondary | ICD-10-CM | POA: Insufficient documentation

## 2019-07-01 HISTORY — PX: IR LUMBAR DISC ASPIRATION W/IMG GUIDE: IMG5306

## 2019-07-01 LAB — CBC WITH DIFFERENTIAL/PLATELET
Abs Immature Granulocytes: 0.03 10*3/uL (ref 0.00–0.07)
Basophils Absolute: 0.1 10*3/uL (ref 0.0–0.1)
Basophils Relative: 1 %
Eosinophils Absolute: 0.2 10*3/uL (ref 0.0–0.5)
Eosinophils Relative: 3 %
HCT: 36.9 % (ref 36.0–46.0)
Hemoglobin: 11.2 g/dL — ABNORMAL LOW (ref 12.0–15.0)
Immature Granulocytes: 0 %
Lymphocytes Relative: 20 %
Lymphs Abs: 1.5 10*3/uL (ref 0.7–4.0)
MCH: 27 pg (ref 26.0–34.0)
MCHC: 30.4 g/dL (ref 30.0–36.0)
MCV: 88.9 fL (ref 80.0–100.0)
Monocytes Absolute: 0.4 10*3/uL (ref 0.1–1.0)
Monocytes Relative: 6 %
Neutro Abs: 5.2 10*3/uL (ref 1.7–7.7)
Neutrophils Relative %: 70 %
Platelets: 300 10*3/uL (ref 150–400)
RBC: 4.15 MIL/uL (ref 3.87–5.11)
RDW: 13.8 % (ref 11.5–15.5)
WBC: 7.4 10*3/uL (ref 4.0–10.5)
nRBC: 0 % (ref 0.0–0.2)

## 2019-07-01 LAB — CMP (CANCER CENTER ONLY)
ALT: 12 U/L (ref 0–44)
AST: 14 U/L — ABNORMAL LOW (ref 15–41)
Albumin: 3.5 g/dL (ref 3.5–5.0)
Alkaline Phosphatase: 88 U/L (ref 38–126)
Anion gap: 8 (ref 5–15)
BUN: 14 mg/dL (ref 6–20)
CO2: 30 mmol/L (ref 22–32)
Calcium: 9.4 mg/dL (ref 8.9–10.3)
Chloride: 107 mmol/L (ref 98–111)
Creatinine: 0.71 mg/dL (ref 0.44–1.00)
GFR, Est AFR Am: >60 mL/min (ref >60)
GFR, Estimated: >60 mL/min (ref >60)
Glucose, Bld: 115 mg/dL — ABNORMAL HIGH (ref 70–99)
Potassium: 4.3 mmol/L (ref 3.5–5.1)
Sodium: 145 mmol/L (ref 135–145)
Total Bilirubin: 0.3 mg/dL (ref 0.3–1.2)
Total Protein: 7.1 g/dL (ref 6.5–8.1)

## 2019-07-01 LAB — CBC
HCT: 38 % (ref 36.0–46.0)
Hemoglobin: 11.7 g/dL — ABNORMAL LOW (ref 12.0–15.0)
MCH: 27.6 pg (ref 26.0–34.0)
MCHC: 30.8 g/dL (ref 30.0–36.0)
MCV: 89.6 fL (ref 80.0–100.0)
Platelets: 351 10*3/uL (ref 150–400)
RBC: 4.24 MIL/uL (ref 3.87–5.11)
RDW: 13.7 % (ref 11.5–15.5)
WBC: 6.6 10*3/uL (ref 4.0–10.5)
nRBC: 0 % (ref 0.0–0.2)

## 2019-07-01 LAB — PROTIME-INR
INR: 0.9 (ref 0.8–1.2)
Prothrombin Time: 12.5 seconds (ref 11.4–15.2)

## 2019-07-01 MED ORDER — HYDROMORPHONE HCL 1 MG/ML IJ SOLN
INTRAMUSCULAR | Status: AC
  Filled 2019-07-01: qty 1

## 2019-07-01 MED ORDER — MIDAZOLAM HCL 2 MG/2ML IJ SOLN
INTRAMUSCULAR | Status: AC | PRN
  Administered 2019-07-01 (×3): 1 mg via INTRAVENOUS

## 2019-07-01 MED ORDER — FLUMAZENIL 0.5 MG/5ML IV SOLN
INTRAVENOUS | Status: AC
  Filled 2019-07-01: qty 5

## 2019-07-01 MED ORDER — LIDOCAINE HCL 1 % IJ SOLN
INTRAMUSCULAR | Status: AC
  Filled 2019-07-01: qty 20

## 2019-07-01 MED ORDER — TETRACAINE HCL 1 % IJ SOLN
60.0000 mg | Freq: Once | INTRAMUSCULAR | Status: AC
  Administered 2019-07-01: 09:00:00 6 mL via INTRADERMAL

## 2019-07-01 MED ORDER — SODIUM CHLORIDE 0.9 % IV SOLN: INTRAVENOUS | Status: AC

## 2019-07-01 MED ORDER — MIDAZOLAM HCL 2 MG/2ML IJ SOLN
INTRAMUSCULAR | Status: AC
  Filled 2019-07-01: qty 4

## 2019-07-01 MED ORDER — TETRACAINE HCL 1 % IJ SOLN
60.0000 mg | INTRAMUSCULAR | Status: AC
  Administered 2019-07-01: 6 mL via INTRASPINAL
  Filled 2019-07-01 (×2): qty 6

## 2019-07-01 MED ORDER — FENTANYL CITRATE (PF) 100 MCG/2ML IJ SOLN
INTRAMUSCULAR | Status: AC | PRN
  Administered 2019-07-01 (×3): 25 ug via INTRAVENOUS

## 2019-07-01 MED ORDER — BUPIVACAINE HCL (PF) 0.5 % IJ SOLN
INTRAMUSCULAR | Status: AC
  Filled 2019-07-01: qty 30

## 2019-07-01 MED ORDER — SODIUM CHLORIDE 0.9 % IV SOLN: INTRAVENOUS | Status: DC

## 2019-07-01 MED ORDER — NALOXONE HCL 0.4 MG/ML IJ SOLN
INTRAMUSCULAR | Status: AC
  Filled 2019-07-01: qty 1

## 2019-07-01 MED ORDER — MORPHINE SULFATE 15 MG PO TABS: 15.0000 mg | ORAL_TABLET | Freq: Four times a day (QID) | ORAL | 0 refills | Status: DC | PRN

## 2019-07-01 MED ORDER — HYDROMORPHONE HCL 1 MG/ML IJ SOLN
INTRAMUSCULAR | Status: AC | PRN
  Administered 2019-07-01: 1 mg via INTRAVENOUS

## 2019-07-01 MED ORDER — TETRACAINE HCL 1 % IJ SOLN
10.0000 mg | Freq: Once | INTRAMUSCULAR | Status: DC
  Administered 2019-07-01: 09:00:00 1 mL via INTRADERMAL
  Filled 2019-07-01: qty 2

## 2019-07-01 MED ORDER — FENTANYL CITRATE (PF) 100 MCG/2ML IJ SOLN
INTRAMUSCULAR | Status: AC
  Filled 2019-07-01: qty 4

## 2019-07-01 NOTE — Telephone Encounter (Signed)
Spoke with Optu RN/[facility] (owned by [facility]).  Her out of pocket expenses would be $935/week with them.  Dropping vancomycin would only reduce it by $60/week. Please advise.  Patient has not yet been notified.

## 2019-07-01 NOTE — Telephone Encounter (Signed)
Scheduled appt per 12/17 los.  Spoke with pt and she is aware of the appt date and time

## 2019-07-01 NOTE — Telephone Encounter (Signed)
RN relayed verbal order to Stanton Kidney at [facility] to begin the insurance authorization and nursing process.  Does she currently have home health nursing with any agency?  We will need to find an agency who can administer first doses of antibiotics at home.  Landis Gandy, RN

## 2019-07-01 NOTE — Telephone Encounter (Signed)
I would give vancomycin 2500 mg IV x 1 then dosed per pharmacy. This is based on her weight.. .she needs a lot to get to steady state because of her weight.

## 2019-07-01 NOTE — Progress Notes (Signed)
[place]   [phone] [phone]   Clinic Follow up Note   Patient Care Team: [name], MD as PCP - General (Family Medicine) Jerline Pain, MD as PCP - Cardiology (Cardiology) Michael Boston, MD as Consulting Physician (General Surgery) Truitt Merle, MD as Consulting Physician (Medical Oncology) Kyung Rudd, MD as Consulting Physician (Radiation Oncology) Teena Irani, MD (Inactive) as Consulting Physician (Gastroenterology) Raynelle Bring, MD as Consulting Physician (Urology) Eustace Moore, MD as Consulting Physician (Neurosurgery)  Date of Service:  07/01/2019   CHIEF COMPLAINT: F/u on rectal cancer   SUMMARY OF ONCOLOGIC HISTORY: Oncology History Overview Note  Rectal adenocarcinoma   Staging form: Colon and Rectum, AJCC 7th Edition     Clinical: T3, N2, M0 - Unsigned     Rectal adenocarcinoma s/p LAR resection 05/25/2015  01/13/2015 Initial Diagnosis   Rectal adenocarcinoma   01/13/2015 Procedure   Colonoscopy showed a sensitivity O nonobstructing mass in the rectum and from 12-18 cm proximal to the Annis. The mass was circumferential, measuring about 6 cm in length. EGD was negative.   01/13/2015 Cancer Staging   Staging form: Colon and Rectum, AJCC 7th Edition - Clinical stage from 01/13/2015: T3, N2, M0 - Signed by Truitt Merle, MD on 12/15/2017   01/17/2015 Tumor Marker   CEA 1.4, CA-19-9 8, MMR normal    01/18/2015 Procedure   Lower endoscopic ultrasound by Dr. Paulita Fujita showed a T3 N2 rectal mass.   01/20/2015 Imaging   CT abdomen and pelvis with contrast showed right lateral rectal wall exophytic mass, and 2 ill-defined hepatic hypoenhancing lesions which appears to correspond to the lesion seen on the prior MRI in 2015.   02/05/2015 Imaging   abdomen MRI showed 2 hemangioma, no suspicion for metastatic disease. CT chest was negative   02/20/2015 - 03/30/2015 Radiation Therapy   neoadjuvant RT to rectal cancer    02/20/2015 Concurrent  Chemotherapy   capecitabine 2500 mg in the morning and 2000 mg in the evening (826m/m2, bid), on the day of radiation.    05/25/2015 Surgery   Recto-sigmoid segmental resection, margins are negative    05/25/2015 Pathology Results   0.2cm residual invasive adenocarcinoma, G2, negative margins, 12 nodes were negative    05/25/2015 Cancer Staging   Staging form: Colon and Rectum, AJCC 7th Edition - Pathologic stage from 05/25/2015: Stage I (T1, N0, cM0) - Signed by FTruitt Merle MD on 12/15/2017   06/23/2015 - 06/29/2015 Hospital Admission   Patient was admitted for pelvic abscess, drain placed and she was treated with IV antibiotics, she also received 1 dose of Feraheme for anemia.   09/15/2015 - 09/17/2015 Hospital Admission   Recurrent pelvic collection s/p perc drainage 09/16/2015   02/21/2016 Imaging   CT abdomen and pelvis w contrast IMPRESSION: 1. Interval removal of surgical drains since 12/19/2015 from the presacral space. Similar size of ill-defined presacral fluid and gas, for which residual abscess cannot be excluded. Similar amount of intraperitoneal edema throughout the upper pelvis with foci of extraluminal gas, possibly related to the presacral process. 2. Diverting right-sided ileostomy, without acute complication. 3. Similar moderate hydroureteronephrosis, likely due to the pelvic process. 4. Possible bladder wall thickening. Correlate with urinalysis. This appearance could be partially due to underdistention. 5. Geographic hepatic steatosis and hemangiomas. Cannot exclude a new right hepatic lobe lesion. Consider nonemergent pre and post contrast outpatient MRI. This could represent a new area of focal steatosis.   05/31/2016 Surgery   ileostomy takedown BY  Dr. Johney Maine    07/04/2016 Imaging   CT Abdomen Pelvis W Contrast 07/04/16 IMPRESSION: Subcutaneous fluid collection at the site of ileostomy takedown, could be sterile or infected, measuring 9.3 x 4.3 x 5.3 cm; this  could be aspirated under ultrasound guidance if clinically indicated. Large collection of stool within the presacral space, could potentially be within a distended distorted rectum but since this occupies the same position as the abscess collection identified on the previous CT this is suspicious for a contiguous contained extraluminal stool collection communicating with the rectosigmoid colon, collection overall roughly 7 x 8 x 9 cm.   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   12/26/2016 Imaging   IMPRESSION: 1. Stable CT exam of the chest.  No evidence for metastatic disease. 2. Interval left colectomy with left abdominal end colostomy. Presacral soft tissue is similar to prior study when taking into account the interval surgery. 3. Hepatic dome lesion seen on the previous study not evident today, potentially related to bolus timing. The 2.4 cm subcapsular inferior right liver lesion remains subtle and is barely visible on today's exam but shows no substantial interval change in size. Continued attention on follow-up recommended.    12/09/2017 Imaging   CT CAP W Contrast 12/09/17  IMPRESSION: 1. Stable post treatment changes in the presacral space with no evidence of recurrent disease in the pelvis. 2. No evidence of metastatic disease in the chest, abdomen or pelvis. 3. Large parastomal hernia has increased in size and contains much of the remnant left colon. No evidence of bowel obstruction or ischemia. 4. Stable dilated main pulmonary artery, suggesting chronic pulmonary arterial hypertension. 5.  Aortic Atherosclerosis (ICD10-I70.0).   12/31/2017 Genetic Testing   The Multi-Cancer Panel offered by [facility] includes sequencing and/or deletion duplication testing of the following 83 genes: ALK, APC, ATM, AXIN2,BAP1,  BARD1, BLM, BMPR1A, BRCA1, BRCA2, BRIP1, CASR, CDC73, CDH1, CDK4, CDKN1B, CDKN1C,  CDKN2A (p14ARF), CDKN2A (p16INK4a), CEBPA, CHEK2, CTNNA1, DICER1, DIS3L2, EGFR (c.2369C>T, p.Thr790Met variant only), EPCAM (Deletion/duplication testing only), FH, FLCN, GATA2, GPC3, GREM1 (Promoter region deletion/duplication testing only), HOXB13 (c.251G>A, p.Gly84Glu), HRAS, KIT, MAX, MEN1, MET, MITF (c.952G>A, p.Glu318Lys variant only), MLH1, MSH2, MSH3, MSH6, MUTYH, NBN, NF1, NF2, NTHL1, PALB2, PDGFRA, PHOX2B, PMS2, POLD1, POLE, POT1, PRKAR1A, PTCH1, PTEN, RAD50, RAD51C, RAD51D, RB1, RECQL4, RET, RUNX1, SDHAF2, SDHA (sequence changes only), SDHB, SDHC, SDHD, SMAD4, SMARCA4, SMARCB1, SMARCE1, STK11, SUFU, TERC, TERT, TMEM127, TP53, TSC1, TSC2, VHL, WRN and WT1.   Results: Negative, no pathogenic variants identified.  The date of this test report is 12/31/2017.    02/02/2019 Imaging   CT CAP W Contrast  IMPRESSION: 1. Status post descending colostomy, without recurrent or metastatic disease. 2. Presacral soft tissue thickening is similar in configuration. New tiny foci of gas within the presacral soft tissues, nonspecific. No well-defined fistulous communication to bowel identified. 3. Possible constipation. 4. Probable mild hepatic steatosis. 5. Interval repair of parastomal hernia.   Aortic Atherosclerosis (ICD10-I70.0).   06/18/2019 Imaging   CT AP W Contrast  IMPRESSION: 1. Stable post treatment changes in presacral region. No evidence of recurrent or metastatic carcinoma within the abdomen or pelvis. 2. New osteolysis involving the superior and inferior vertebral endplates at K2-4, suspicious for infectious discitis. Recommend clinical correlation, and consider lumbar spine MRI without and with contrast for further evaluation.   These results will be called to the ordering clinician or representative by the Radiologist Assistant, and communication  documented in the PACS or zVision Dashboard.    06/26/2019 Imaging    MRI Lumbar 06/26/19  IMPRESSION: Extensive abnormality at  L3-4 with bone marrow edema and enhancement as well as extensive endplate irregularity. There is paraspinous soft tissue thickening and enhancement as well as epidural thickening and enhancement. Findings are most likely due to chronic infection. Another possibility would be severe adjacent segment degeneration above the level of fusion at L4-5. Metastatic disease not considered likely.      CURRENT THERAPY:  Surveillance  INTERVAL HISTORY:  Lindsay Little is here for a follow up. She presents to the clinic with her father. She notes during her biopsy this morning no numbing agents worked well and she was awake during biopsy. She has back pain and able to ambulate by herself. She notes her pain stop lower back and radiate to hips, lower abdomen and thighs. This pain started in 04/2019 but was no longer present after surgery. She notes she also still has urinary incontinence. Her stool output has been harder lately in colostomy. She also notes recent chest heaviness feeling.  She is emotional about being in pain. She declined seeing SW today.    REVIEW OF SYSTEMS:   Constitutional: Denies fevers, chills or abnormal weight loss Eyes: Denies blurriness of vision Ears, nose, mouth, throat, and face: Denies mucositis or sore throat Respiratory: Denies cough, dyspnea or wheezes Cardiovascular: Denies palpitation or lower extremity swelling (+) Chest heaviness occasionally  Gastrointestinal:  Denies nausea, heartburn (+) Mild constipation (+) Abdominal soreness  UA: (+) Urinary incontinence  MSK: (+) lower back pain radiating to hips, lower abdomen and thighs.  Skin: Denies abnormal skin rashes Lymphatics: Denies new lymphadenopathy or easy bruising Neurological: (+) Neuropathy of feet Behavioral/Psych: Mood is stable, no new changes  All other systems were reviewed with the patient and are negative.  MEDICAL HISTORY:  Past Medical History:  Diagnosis Date  . Anxiety   .  Chemotherapy-induced neuropathy (HCC)    toes and fingers numbness and tingling  . Colorectal delayed anastomotic leak s/p resection & colostomy 07/12/2016 11/17/2015  . Colostomy in place [facility]) 07/12/2016   due to anastomosis breakdown w/ colovaginal fistula  . Colovaginal fistula s/p omentopexy repair 07/12/2016 07/10/2016  . Complication of anesthesia   . Depression   . Family history of adverse reaction to anesthesia    mother-- ponv  . Family history of breast cancer   . Family history of pancreatic cancer   . Family history of skin cancer   . Gastroenteritis 12/20/2015  . GERD (gastroesophageal reflux disease)   . Hiatal hernia   . History of cancer chemotherapy 02-20-2015 to 03-30-2015  . History of cardiac murmur as a child   . History of chemotherapy   . History of chronic gastritis   . History of hypertension    no issues since multiple abdminal sx's and chemo--- no medication since 12/ 2016  . History of TMJ disorder   . Hypercholesteremia   . Hypertension   . IBS (irritable bowel syndrome)    dx age 23  . Intermittent abdominal pain    post-op multiple abdominal sx's   . Microcytic anemia   . Mild sleep apnea    per pt study 2014  very mild osa , no cpap recommended, recommeded wt loss and sleep routine  . OA (osteoarthritis)    left knee /  left shoulder  . Obesity   . PONV (postoperative nausea and vomiting)    severe"  needs Scopolamine PATCH"   . Portacath in place    right chest  . Rectal adenocarcinoma ([place]) oncologis-  dr Burr Medico-- after radiation/ chemo (ypT1, N0) --  no recurrence per last note 03/ 2018   dx 01-13-2015-- Stage IIIC (T3, N2, M0) post concurrent radiation and chemotherapy 02-20-2015 to 03-30-2015 /  05-25-2015 s/p  LAR w/ RSO (post-op complicated by late abscess and contained anatomotic leak w/ help percutaneous drainage and antibiotics)   . Rotator cuff tear, left   . S/P radiation therapy 02/20/15-03/30/15   colon/rectal  . Vitamin D deficiency     . Wears glasses   . Wears glasses     SURGICAL HISTORY: Past Surgical History:  Procedure Laterality Date  . ABDOMINAL HYSTERECTOMY  1996   uterus and cervix  . BACK SURGERY  02/12/2018   lumbar surgery  . COLON RESECTION N/A 07/12/2016   Procedure: LAPAROSCOPIC LYSIS OF ADHESIONS, OMENTOPEXY, HAND ASSISTED RESECTION OF  COLON, END TO END ANASTOMOSIS, COLOSTOMY;  Surgeon: Michael Boston, MD;  Location: [facility];  Service: General;  Laterality: N/A;  . COMBINED HYSTEROSCOPY DIAGNOSTIC / D&C  x2 1990's  . DIAGNOSTIC LAPAROSCOPY  age 71 and age 41  . EUS N/A 01/18/2015   Procedure: LOWER ENDOSCOPIC ULTRASOUND (EUS);  Surgeon: Arta Silence, MD;  Location: [facility];  Service: Endoscopy;  Laterality: N/A;  . EXCISION OF SKIN TAG  11/17/2015   Procedure: EXCISION OF SKIN TAG;  Surgeon: Michael Boston, MD;  Location: [facility];  Service: General;;  . ILEO LOOP COLOSTOMY CLOSURE N/A 11/17/2015   Procedure: LAPAROSCOPIC DIVERTING LOOP ILEOSTOMY  DRAINAGE OF PELVIC ABSCESS;  Surgeon: Michael Boston, MD;  Location: [facility];  Service: General;  Laterality: N/A;  . ILEOSTOMY CLOSURE N/A 05/31/2016   Procedure: TAKEDOWN LOOP ILEOSTOMY;  Surgeon: Michael Boston, MD;  Location: [facility];  Service: General;  Laterality: N/A;  . IMPACTION REMOVAL  11/17/2015   Procedure: DISIMPACTION REMOVAL;  Surgeon: Michael Boston, MD;  Location: [facility];  Service: General;;  . INSERTION OF MESH N/A 07/23/2018   Procedure: INSERTION OF MESH X2;  Surgeon: Michael Boston, MD;  Location: [facility];  Service: General;  Laterality: N/A;  . KNEE ARTHROSCOPY Left 1990's  . KNEE ARTHROSCOPY W/ MENISCECTOMY Left 09/14/2009   and chondroplasty debridement  . LAPAROSCOPIC LYSIS OF ADHESIONS  11/17/2015   Procedure: LAPAROSCOPIC LYSIS OF ADHESIONS;  Surgeon: Michael Boston, MD;  Location: [facility];  Service: General;;  . LUMBAR WOUND DEBRIDEMENT N/A 04/24/2018   Procedure: wound exploration, irrigation and debridement;  Surgeon: Eustace Moore, MD;   Location: [facility];  Service: Neurosurgery;  Laterality: N/A;  wound exploration, irrigation and debridement  . LYSIS OF ADHESION N/A 07/23/2018   Procedure: LYSIS OF ADHESIONS;  Surgeon: Michael Boston, MD;  Location: [facility];  Service: General;  Laterality: N/A;  . PORT-A-CATH REMOVAL N/A 11/21/2016   Procedure: REMOVAL PORT-A-CATH;  Surgeon: Michael Boston, MD;  Location: [facility];  Service: General;  Laterality: N/A;  . PORTACATH PLACEMENT N/A 05/25/2015   Procedure: INSERTION PORT-A-CATH;  Surgeon: Michael Boston, MD;  Location: [facility];  Service: General;  Laterality: N/A;-remains inplace Right chest.  . ROTATOR CUFF REPAIR Right 2006  . TONSILLECTOMY  age 74  . VENTRAL HERNIA REPAIR N/A 07/23/2018   Procedure: LAPAROSCOPIC VENTRAL WALL HERNIA REPAIR;  Surgeon: Michael Boston, MD;  Location: [facility];  Service: General;  Laterality: N/A;  . XI ROBOTIC ASSISTED LOWER ANTERIOR RESECTION N/A 05/25/2015   Procedure: XI ROBOTIC  ASSISTED LOWER ANTERIOR RESECTION, , RIGID PROCTOSCOPY, RIGHT OOPHORECTOMY;  Surgeon: Michael Boston, MD;  Location: [facility];  Service: General;  Laterality: N/A;    I have reviewed the social history and family history with the patient and they are unchanged from previous note.  ALLERGIES:  is allergic to caine-1 [lidocaine]; sulfa antibiotics; adhesive [tape]; doxycycline; flagyl [metronidazole]; iron; oxycodone; and penicillins.  MEDICATIONS:  Current Outpatient Medications  Medication Sig Dispense Refill  . Cholecalciferol (VITAMIN D3) 5000 units CAPS Take 5,000 Units by mouth at bedtime.     . Coenzyme Q10 (COQ10) 200 MG CAPS Take 200 mg by mouth at bedtime.    . DULoxetine (CYMBALTA) 60 MG capsule Take 120 mg by mouth at bedtime.     Marland Kitchen esomeprazole (NEXIUM) 40 MG capsule Take 1 capsule (40 mg total) by mouth 2 (two) times daily before a meal. 60 capsule 5  . furosemide (LASIX) 20 MG tablet Take 20 mg by mouth daily as needed for edema.     . gabapentin  (NEURONTIN) 300 MG capsule Take 900 mg by mouth 4 (four) times daily.    Marland Kitchen HYDROcodone-acetaminophen (NORCO) 10-325 MG tablet Take 1-2 tablets by mouth every 6 (six) hours as needed for moderate pain or severe pain. 40 tablet 0  . Melatonin 3 MG TABS Take 3-6 mg by mouth at bedtime.     . meloxicam (MOBIC) 15 MG tablet Take 15 mg by mouth at bedtime.     . Multiple Vitamin (MULTIVITAMIN WITH MINERALS) TABS tablet Take 2 tablets by mouth at bedtime.     . nebivolol (BYSTOLIC) 5 MG tablet Take 10 mg by mouth at bedtime.     . polyethylene glycol (MIRALAX / GLYCOLAX) packet Take 17 g by mouth daily as needed for moderate constipation.    . rosuvastatin (CRESTOR) 5 MG tablet Take 5 mg by mouth at bedtime.    . triamcinolone lotion (KENALOG) 0.1 % Apply 1 application topically as needed (dry skin).    Marland Kitchen morphine (MSIR) 15 MG tablet Take 1 tablet (15 mg total) by mouth every 6 (six) hours as needed for severe pain. 60 tablet 0  . tiZANidine (ZANAFLEX) 4 MG tablet Take 4 mg by mouth 3 (three) times daily as needed for muscle spasms.   2   No current facility-administered medications for this visit.   Facility-Administered Medications Ordered in Other Visits  Medication Dose Route Frequency Provider Last Rate Last Admin  . 0.9 %  sodium chloride infusion   Intravenous Once Truitt Merle, MD      . 0.9 %  sodium chloride infusion   Intravenous Continuous Monia Sabal, PA-C      . HYDROmorphone (DILAUDID) 1 MG/ML injection             PHYSICAL EXAMINATION: ECOG PERFORMANCE STATUS: 3 - Symptomatic, >50% confined to bed  Vitals:   07/01/19 1357  BP: (!) 137/92  Pulse: 75  Resp: 17  Temp: 97.8 F (36.6 C)  SpO2: 97%   Filed Weights   07/01/19 1357  Weight: (!) 333 lb 11.2 oz (151.4 kg)    GENERAL:alert, no distress and comfortable SKIN: skin color, texture, turgor are normal, no rashes or significant lesions EYES: normal, Conjunctiva are pink and non-injected, sclera clear  NECK: supple,  thyroid normal size, non-tender, without nodularity LYMPH:  no palpable lymphadenopathy in the cervical, axillary  LUNGS: clear to auscultation and percussion with normal breathing effort HEART: regular rate & rhythm and no murmurs and no  lower extremity edema ABDOMEN:abdomen soft, non-tender and normal bowel sounds Musculoskeletal:no cyanosis of digits and no clubbing  NEURO: alert & oriented x 3 with fluent speech, no focal motor/sensory deficits  LABORATORY DATA:  I have reviewed the data as listed CBC Latest Ref Rng & Units 07/01/2019 07/01/2019 06/18/2019  WBC 4.0 - 10.5 K/uL 7.4 6.6 7.3  Hemoglobin 12.0 - 15.0 g/dL 11.2(L) 11.7(L) 11.7(L)  Hematocrit 36.0 - 46.0 % 36.9 38.0 37.4  Platelets 150 - 400 K/uL 300 351 298     CMP Latest Ref Rng & Units 07/01/2019 06/18/2019 01/20/2019  Glucose 70 - 99 mg/dL 115(H) 102(H) 110(H)  BUN 6 - 20 mg/dL _0 Creatinine 0.44 - 1.00 mg/dL 0.71 0.71 0.72  Sodium 135 - 145 mmol/L 145 140 140  Potassium 3.5 - 5.1 mmol/L 4.3 4.3 4.2  Chloride 98 - 111 mmol/L 107 104 106  CO2 22 - 32 mmol/L _1 Calcium 8.9 - 10.3 mg/dL 9.4 9.7 9.4  Total Protein 6.5 - 8.1 g/dL 7.1 7.4 7.1  Total Bilirubin 0.3 - 1.2 mg/dL 0.3 0.3 0.4  Alkaline Phos 38 - 126 U/L 88 93 93  AST 15 - 41 U/L 14(L) 14(L) 12(L)  ALT 0 - 44 U/L _2 RADIOGRAPHIC STUDIES: I have personally reviewed the radiological images as listed and agreed with the findings in the report. IR PICC PLACEMENT RIGHT >5 YRS INC IMG GUIDE  Result Date: 07/01/2019 INDICATION: Lumbar discitis. Need for central venous access for long-term IV antibiotics. EXAM: ULTRASOUND AND FLUOROSCOPIC GUIDED PICC LINE INSERTION MEDICATIONS: 1% tetracaine 3 mL CONTRAST:  None FLUOROSCOPY TIME:  12 seconds COMPLICATIONS: None immediate. TECHNIQUE: The procedure, risks, benefits, and alternatives were explained to the patient and informed written consent was obtained. A timeout was performed prior to the  initiation of the procedure. The right upper extremity was prepped with chlorhexidine in a sterile fashion, and a sterile drape was applied covering the operative field. Maximum barrier sterile technique with sterile gowns and gloves were used for the procedure. A timeout was performed prior to the initiation of the procedure. Local anesthesia was provided with 1% tetracaine. After the overlying soft tissues were anesthetized, a micropuncture kit was utilized to access the right basilic vein. Real-time ultrasound guidance was utilized for vascular access including the acquisition of a permanent ultrasound image documenting patency of the accessed vessel. A guidewire was advanced to the level of the superior caval-atrial junction for measurement purposes and the PICC line was cut to length. A peel-away sheath was placed and a 46 cm, 5 [place], single lumen was inserted to level of the superior caval-atrial junction. A post procedure spot fluoroscopic was obtained. The catheter easily aspirated and flushed and was secured in place. A dressing was placed. The patient tolerated the procedure well without immediate post procedural complication. FINDINGS: After catheter placement, the tip lies within the superior cavoatrial junction. The catheter aspirates and flushes normally and is ready for immediate use. IMPRESSION: Successful ultrasound and fluoroscopic guided placement of a right basilic vein approach, 46 cm, 5 French, single lumen PICC with tip at the superior caval-atrial junction. The PICC line is ready for immediate use. Read by: Gareth Eagle, PA-C Electronically Signed   By: Aletta Edouard M.D.   On: 07/01/2019 09:20     ASSESSMENT & PLAN:  Lindsay Little is a 53 y.o. female with   1. Lower back pain, Lumbar spine L3-4 infection  -  Her CT AP from 06/18/19 shows no evidence of malignancy. She does have bone loss between L3-L4. Lumbar MRI from 06/26/19 shows evidence of chronic infection between L3-L4. The  radiologist is suspicious of infectious discitis  -She had biopsy today (07/01/19) results are pending. Gram stain was negative for bacteria  -I have discussed her case with ID Dr. Tommy Medal who recommends empiric iv vancomycin and ceftriaxone, PICC line was placed this morning  -ID is setting up home care for iv antibiotics. She will see Infectious disease doctor soon.  -She still has worsening back pain that radiates to her lower abdomen, hips and thighs.  -She is currently on Cymbal 173m, Gabapentin 9057mQID, Norco 10-32550mp to 6 times a day. Managed by her PCP Dr WhiDema Severinhis is no longer controlling her worsening pain.  -To better control her I will call in Morphine [id number] 6 hours as needed (07/01/19). Will call in long acting if this is not enough next week. I also will refer her to Dr. HarkMaryjean Ka pain management.  -I discussed this pain can improve with treatment of infection but may become chronic.  -For her mild constipation I recommend she use stool softener, especially as more pain medication causes constipation.   2. Rectal adenocarcinomaT3N2M0, Stage IIIC , ypT1N0 after neoadjuvant chemotherapy and radiation -Diagnosed in 2016. Treated with surgerywith neoadjuvantchemo and radiation. She is currently on surveillance.She has permanent colostomy bag in place.  -Since surgery she has had back and pelvic pain but this has been tolerable, until the last 5 months and has required pain medication.   -Her CT AP from 06/18/19 which shows no evidence of malignancy. She does have bone loss between L3-L4. The radiologist is concerned of infection there and recommends lumbar MRI. -She also has had yellow/bloody draining of her vagina and rectum for at least the past year. [facility] due to her high rectovaginal fistular. I spoke with Dr. GrosJohney Maine indicates that her rectum has been removed, no more surgery he can offer.  -She had a bone  biopsy done today (07/01/19). Results are still pending to rule out cancer.  -Physical exam today benign except tenderness at low lumbar. Labs reviewed with mild anemia.  -Her recent MRI is not highly suspicious from malignancy, however patient is still very concerned, we discussed palliative radiation if biopsy confirms cancer recurrence     3. Rectal and Vaginal discharge, Urinary incontinence -She has incontinence from prior surgery.  -per Dr. MattRodman Keye has a high rectovaginal fistula. She has persistent discharge from rectal and vaginal, she wears depends and has to change it 1-2 times daily.  -Her surgeon does not recommend further rectal surgery.  -f/u with Dr. MattRodman Key Dr. GrosJohney Maine. Bilateral Leg muscle cramps and neuropathy in feet -Currently on Gabapentin and Cymbalta with Meloxicam and Tramadol. -She will f/u with PCP, orthopedics and neurologist.  5. HTN -She'll continue follow-up with her primary care physician   Plan  -Copy note to her neurosurgen Dr. JoneRonnald Ramp called in Morphine today to replace Norco  -Virtual Visit with NP Lacie in 1 week to titrate her pain meds  -referral to Dr. HarkMaryjean Kargent referral to ID, I spoke with Dr. Van Tommy Medal his NP today, they will set up home care for iv antibiotics and her appointment to see them early next week  -I will call him tomorrow about her biopsy result  No problem-specific Assessment & Plan notes found for this encounter.   Orders Placed This Encounter  Procedures  . Ambulatory referral to Pain Clinic    Referral Priority:   Routine    Referral Type:   Consultation    Referral Reason:   Specialty Services Required    Requested Specialty:   Pain Medicine    Number of Visits Requested:   1  . Ambulatory referral to Infectious Disease    Referral Priority:   Urgent    Referral Type:   Consultation    Referral Reason:   Specialty Services Required    Requested Specialty:   Infectious Diseases     Number of Visits Requested:   1   All questions were answered. The patient knows to call the clinic with any problems, questions or concerns. No barriers to learning was detected. I spent 30 minutes counseling the patient face to face. The total time spent in the appointment was 40 minutes and more than 50% was on counseling and review of test results     Truitt Merle, MD 07/01/2019   I, Joslyn Devon, am acting as scribe for Truitt Merle, MD.   I have reviewed the above documentation for accuracy and completeness, and I agree with the above.

## 2019-07-01 NOTE — Procedures (Signed)
S/P L3 L4 disc  aspiration x 2  S.Jyllian Haynie MD

## 2019-07-01 NOTE — H&P (Signed)
Chief Complaint: Patient was seen in consultation today for L3-4 disc aspiration at the request of [name],[name]  Referring Physician(s): [name],[name]  Supervising Physician: Luanne Bras  Patient Status: [facility] - Out-pt  History of Present Illness: Lindsay Little is a 53 y.o. female   Hx Rectal Ca Radiation 2016 Rectosigmoid segmental resection 05/2015 Ileostomy take down 05/2016 Lumbar surgery 02/2018 secondary radiation changes Post infection-- treated with antibiotics  New back pain x 3-4 months  MRI 06/26/19: IMPRESSION: Extensive abnormality at L3-4 with bone marrow edema and enhancement as well as extensive endplate irregularity. There is paraspinous soft tissue thickening and enhancement as well as epidural thickening and enhancement. Findings are most likely due to chronic infection. Another possibility would be severe adjacent segment degeneration above the level of fusion at L4-5. Metastatic disease not considered likely.  Now scheduled for Lumbar 3-4 disc aspiration per Dr Burr Medico   Past Medical History:  Diagnosis Date  . Anxiety   . Chemotherapy-induced neuropathy (HCC)    toes and fingers numbness and tingling  . Colorectal delayed anastomotic leak s/p resection & colostomy 07/12/2016 11/17/2015  . Colostomy in place [facility]) 07/12/2016   due to anastomosis breakdown w/ colovaginal fistula  . Colovaginal fistula s/p omentopexy repair 07/12/2016 07/10/2016  . Complication of anesthesia   . Depression   . Family history of adverse reaction to anesthesia    mother-- ponv  . Family history of breast cancer   . Family history of pancreatic cancer   . Family history of skin cancer   . Gastroenteritis 12/20/2015  . GERD (gastroesophageal reflux disease)   . Hiatal hernia   . History of cancer chemotherapy 02-20-2015 to 03-30-2015  . History of cardiac murmur as a child   . History of chemotherapy   . History of chronic gastritis   . History of hypertension      no issues since multiple abdminal sx's and chemo--- no medication since 12/ 2016  . History of TMJ disorder   . Hypercholesteremia   . Hypertension   . IBS (irritable bowel syndrome)    dx age 27  . Intermittent abdominal pain    post-op multiple abdominal sx's   . Microcytic anemia   . Mild sleep apnea    per pt study 2014  very mild osa , no cpap recommended, recommeded wt loss and sleep routine  . OA (osteoarthritis)    left knee /  left shoulder  . Obesity   . PONV (postoperative nausea and vomiting)    severe" needs Scopolamine PATCH"   . Portacath in place    right chest  . Rectal adenocarcinoma ([place]) oncologis-  dr Burr Medico-- after radiation/ chemo (ypT1, N0) --  no recurrence per last note 03/ 2018   dx 01-13-2015-- Stage IIIC (T3, N2, M0) post concurrent radiation and chemotherapy 02-20-2015 to 03-30-2015 /  05-25-2015 s/p  LAR w/ RSO (post-op complicated by late abscess and contained anatomotic leak w/ help percutaneous drainage and antibiotics)   . Rotator cuff tear, left   . S/P radiation therapy 02/20/15-03/30/15   colon/rectal  . Vitamin D deficiency   . Wears glasses   . Wears glasses     Past Surgical History:  Procedure Laterality Date  . ABDOMINAL HYSTERECTOMY  1996   uterus and cervix  . BACK SURGERY  02/12/2018   lumbar surgery  . COLON RESECTION N/A 07/12/2016   Procedure: LAPAROSCOPIC LYSIS OF ADHESIONS, OMENTOPEXY, HAND ASSISTED RESECTION OF  COLON, END TO END ANASTOMOSIS, COLOSTOMY;  Surgeon: Michael Boston, MD;  Location: [facility];  Service: General;  Laterality: N/A;  . COMBINED HYSTEROSCOPY DIAGNOSTIC / D&C  x2 1990's  . DIAGNOSTIC LAPAROSCOPY  age 20 and age 68  . EUS N/A 01/18/2015   Procedure: LOWER ENDOSCOPIC ULTRASOUND (EUS);  Surgeon: Arta Silence, MD;  Location: [facility];  Service: Endoscopy;  Laterality: N/A;  . EXCISION OF SKIN TAG  11/17/2015   Procedure: EXCISION OF SKIN TAG;  Surgeon: Michael Boston, MD;  Location: [facility];  Service: General;;   . ILEO LOOP COLOSTOMY CLOSURE N/A 11/17/2015   Procedure: LAPAROSCOPIC DIVERTING LOOP ILEOSTOMY  DRAINAGE OF PELVIC ABSCESS;  Surgeon: Michael Boston, MD;  Location: [facility];  Service: General;  Laterality: N/A;  . ILEOSTOMY CLOSURE N/A 05/31/2016   Procedure: TAKEDOWN LOOP ILEOSTOMY;  Surgeon: Michael Boston, MD;  Location: [facility];  Service: General;  Laterality: N/A;  . IMPACTION REMOVAL  11/17/2015   Procedure: DISIMPACTION REMOVAL;  Surgeon: Michael Boston, MD;  Location: [facility];  Service: General;;  . INSERTION OF MESH N/A 07/23/2018   Procedure: INSERTION OF MESH X2;  Surgeon: Michael Boston, MD;  Location: [facility];  Service: General;  Laterality: N/A;  . KNEE ARTHROSCOPY Left 1990's  . KNEE ARTHROSCOPY W/ MENISCECTOMY Left 09/14/2009   and chondroplasty debridement  . LAPAROSCOPIC LYSIS OF ADHESIONS  11/17/2015   Procedure: LAPAROSCOPIC LYSIS OF ADHESIONS;  Surgeon: Michael Boston, MD;  Location: [facility];  Service: General;;  . LUMBAR WOUND DEBRIDEMENT N/A 04/24/2018   Procedure: wound exploration, irrigation and debridement;  Surgeon: Eustace Moore, MD;  Location: [facility];  Service: Neurosurgery;  Laterality: N/A;  wound exploration, irrigation and debridement  . LYSIS OF ADHESION N/A 07/23/2018   Procedure: LYSIS OF ADHESIONS;  Surgeon: Michael Boston, MD;  Location: [facility];  Service: General;  Laterality: N/A;  . PORT-A-CATH REMOVAL N/A 11/21/2016   Procedure: REMOVAL PORT-A-CATH;  Surgeon: Michael Boston, MD;  Location: [facility];  Service: General;  Laterality: N/A;  . PORTACATH PLACEMENT N/A 05/25/2015   Procedure: INSERTION PORT-A-CATH;  Surgeon: Michael Boston, MD;  Location: [facility];  Service: General;  Laterality: N/A;-remains inplace Right chest.  . ROTATOR CUFF REPAIR Right 2006  . TONSILLECTOMY  age 68  . VENTRAL HERNIA REPAIR N/A 07/23/2018   Procedure: LAPAROSCOPIC VENTRAL WALL HERNIA REPAIR;  Surgeon: Michael Boston, MD;  Location: [facility];  Service: General;  Laterality: N/A;  . XI  ROBOTIC ASSISTED LOWER ANTERIOR RESECTION N/A 05/25/2015   Procedure: XI ROBOTIC ASSISTED LOWER ANTERIOR RESECTION, , RIGID PROCTOSCOPY, RIGHT OOPHORECTOMY;  Surgeon: Michael Boston, MD;  Location: [facility];  Service: General;  Laterality: N/A;    Allergies: Caine-1 [lidocaine], Sulfa antibiotics, Adhesive [tape], Doxycycline, Flagyl [metronidazole], Iron, Oxycodone, and Penicillins  Medications: Prior to Admission medications   Medication Sig Start Date End Date Taking? Authorizing Provider  Cholecalciferol (VITAMIN D3) 5000 units CAPS Take 5,000 Units by mouth at bedtime.     [provider]  Coenzyme Q10 (COQ10) 200 MG CAPS Take 200 mg by mouth at bedtime.    [provider]  DULoxetine (CYMBALTA) 60 MG capsule Take 120 mg by mouth at bedtime.     [provider]  esomeprazole (NEXIUM) 40 MG capsule Take 1 capsule (40 mg total) by mouth 2 (two) times daily before a meal. 12/22/15   Michael Boston, MD  furosemide (LASIX) 20 MG tablet Take 20 mg by mouth daily as needed for edema.     [provider]  gabapentin (NEURONTIN) 300 MG capsule Take 900 mg by mouth 4 (four) times daily.    [provider]  HYDROcodone-acetaminophen (NORCO) 10-325 MG tablet Take 1-2 tablets by mouth every 6 (six) hours as needed for moderate pain or severe pain. 07/26/18   Michael Boston, MD  Melatonin 3 MG TABS Take 3-6 mg by mouth at bedtime.     [provider]  meloxicam (MOBIC) 15 MG tablet Take 15 mg by mouth at bedtime.     [provider]  Multiple Vitamin (MULTIVITAMIN WITH MINERALS) TABS tablet Take 2 tablets by mouth at bedtime.     [provider]  nebivolol (BYSTOLIC) 5 MG tablet Take 10 mg by mouth at bedtime.     [provider]  polyethylene glycol (MIRALAX / GLYCOLAX) packet Take 17 g by mouth daily as needed for moderate constipation.    [provider]  rosuvastatin (CRESTOR) 5 MG tablet Take 5 mg by mouth at bedtime.     [provider]  tiZANidine (ZANAFLEX) 4 MG tablet Take 4 mg by mouth 3 (three) times daily as needed for muscle spasms.  05/28/18   [provider]  triamcinolone lotion (KENALOG) 0.1 % Apply 1 application topically as needed (dry skin).    [provider]     Family History  Problem Relation Age of Onset  . Coronary artery disease Mother 67  . Hypertension Mother   . Hyperlipidemia Mother   . Diabetes Mellitus I Mother   . Coronary artery disease Father   . Hyperlipidemia Father   . Hypertension Father   . Cancer Sister        skin - non melanoma  . Hyperlipidemia Brother   . Cancer Maternal Uncle 57       pancreatic with mets to colon and prostate  . Cirrhosis Maternal Uncle   . Hypertension Maternal Grandmother   . Diabetes Mellitus I Maternal Grandmother   . Hyperlipidemia Maternal Grandmother   . CVA Maternal Grandmother   . Hypertension Maternal Grandfather   . Coronary artery disease Maternal Grandfather 65  . Hyperlipidemia Maternal Grandfather   . Coronary artery disease Paternal Grandmother   . Hypertension Paternal Grandmother   . Hyperlipidemia Paternal Grandmother   . Diabetes Mellitus I Paternal Grandmother   . Hypertension Paternal Grandfather   . Hyperlipidemia Paternal Grandfather   . Coronary artery disease Paternal Grandfather     Social History   Socioeconomic History  . Marital status: Married    Spouse name: Not on file  . Number of children: Not on file  . Years of education: Not on file  . Highest education level: Not on file  Occupational History  . Not on file  Tobacco Use  . Smoking status: Former Smoker    Packs/day: 0.25    Years: 7.00    Pack years: 1.75    Types: Cigarettes    Quit date: 07/16/1991    Years since quitting: 27.9  . Smokeless tobacco: Never Used  Substance and Sexual Activity  . Alcohol use: No  . Drug use: Never  . Sexual activity: Yes    Birth control/protection: Surgical  Other  Topics Concern  . Not on file  Social History Narrative   Tobacco Use: Cigarettes - Former Smoker   Alcohol: Yes, very rare, liquor.    No recreational drug use   Occupation: Head CMA @ [employer]   Marital Status: Married    Husband: Roselyn Reef Disabled   Children:  2 adopted kids Maggie & Quillian Quince   Religion: [employer] [place]               Social Determinants of Health   Financial Resource Strain:   . Difficulty of Paying Living Expenses: Not on file  Food Insecurity:   . Worried About Charity fundraiser in the Last Year: Not on file  . Ran Out of Food in the Last Year: Not on file  Transportation Needs:   . Lack of Transportation (Medical): Not on file  . Lack of Transportation (Non-Medical): Not on file  Physical Activity:   . Days of Exercise per Week: Not on file  . Minutes of Exercise per Session: Not on file  Stress:   . Feeling of Stress : Not on file  Social Connections:   . Frequency of Communication with Friends and Family: Not on file  . Frequency of Social Gatherings with Friends and Family: Not on file  . Attends Religious Services: Not on file  . Active Member of Clubs or Organizations: Not on file  . Attends Archivist Meetings: Not on file  . Marital Status: Not on file    Review of Systems: A 12 point ROS discussed and pertinent positives are indicated in the HPI above.  All other systems are negative.  Review of Systems  Constitutional: Positive for activity change. Negative for fatigue and fever.  Respiratory: Negative for cough and shortness of breath.   Cardiovascular: Negative for chest pain.  Musculoskeletal: Positive for back pain and gait problem.  Neurological: Negative for weakness.  Psychiatric/Behavioral: Negative for behavioral problems and confusion.    Vital Signs: There were no vitals taken for this visit.  Physical Exam Vitals reviewed.  Cardiovascular:     Rate and Rhythm: Normal rate and regular rhythm.       Heart sounds: Normal heart sounds.  Pulmonary:     Effort: Pulmonary effort is normal.     Breath sounds: Normal breath sounds.  Abdominal:     Palpations: Abdomen is soft.  Musculoskeletal:        General: Normal range of motion.     Right lower leg: No edema.     Left lower leg: No edema.     Comments: Low back pain  Skin:    General: Skin is dry.  Neurological:     Mental Status: She is alert and oriented to person, place, and time.  Psychiatric:        Mood and Affect: Mood normal.        Behavior: Behavior normal.        Thought Content: Thought content normal.        Judgment: Judgment normal.     Imaging: MR Lumbar Spine W Wo Contrast  Result Date: 06/26/2019 CLINICAL DATA:  Low back pain with bilateral hip and leg pain. L4-5 fusion October 2019. Abnormal CT. Rule out infection or metastatic disease. EXAM: MRI LUMBAR SPINE WITHOUT AND WITH CONTRAST TECHNIQUE: Multiplanar and multiecho pulse sequences of the lumbar spine were obtained without and with intravenous contrast. CONTRAST:  57m GADAVIST GADOBUTROL 1 MMOL/ML IV SOLN COMPARISON:  CT abdomen pelvis 06/18/2019.  Lumbar MRI 04/10/2018 FINDINGS: Segmentation:  Normal Alignment:  Slight anterolisthesis L4-5. Vertebrae: Extensive bone marrow edema at L3-4. There is irregularity of the endplates. The disc shows increased signal on inversion recovery and mild enhancement. Bone marrow shows diffuse enhancement. Multiple endplate defects are noted on CT which have relatively well-defined margins.  Bone marrow edema and enhancement was not seen at this level on the MRI of 04/10/2018 Negative for fracture.  No mass lesion identified. Conus medullaris and cauda equina: Conus extends to the L1 level. Conus and cauda equina appear normal. Paraspinal and other soft tissues: Mild paraspinous soft tissue thickening and enhancement at the L3-4 level. In addition, there is ventral epidural thickening and enhancement at this level without  abscess. No psoas abscess identified. Disc levels: L1-2: Mild facet degeneration bilaterally.  Negative for stenosis L2-3: Diffuse bulging of the disc. Mild facet degeneration and mild spinal stenosis. L3-4: Endplate irregularity with diffuse bone marrow edema and enhancement at L3 and L4. Sclerotic changes in the L4 vertebral body on CT. Bilateral facet hypertrophy. Moderate spinal stenosis. L4-5: Pedicle screw and interbody fusion.  Negative for stenosis L5-S1: Negative IMPRESSION: Extensive abnormality at L3-4 with bone marrow edema and enhancement as well as extensive endplate irregularity. There is paraspinous soft tissue thickening and enhancement as well as epidural thickening and enhancement. Findings are most likely due to chronic infection. Another possibility would be severe adjacent segment degeneration above the level of fusion at L4-5. Metastatic disease not considered likely. Electronically Signed   By: Franchot Gallo M.D.   On: 06/26/2019 19:08   CT Abdomen Pelvis W Contrast  Result Date: 06/18/2019 CLINICAL DATA:  Follow-up rectal carcinoma. EXAM: CT ABDOMEN AND PELVIS WITH CONTRAST TECHNIQUE: Multidetector CT imaging of the abdomen and pelvis was performed using the standard protocol following bolus administration of intravenous contrast. CONTRAST:  143m OMNIPAQUE IOHEXOL 300 MG/ML  SOLN COMPARISON:  02/02/2019 FINDINGS: Lower Chest: No acute findings. Hepatobiliary: No hepatic masses identified. Gallbladder is unremarkable. No evidence of biliary ductal dilatation. Pancreas:  No mass or inflammatory changes. Spleen: Within normal limits in size and appearance. Adrenals/Urinary Tract: No masses identified. No evidence of hydronephrosis. Stomach/Bowel: Postop changes from prior abdominoperitoneal resection and left upper quadrant colostomy are again seen. Stable concave soft tissue density in the presacral region is consistent with post treatment changes. No masses identified. No evidence of  bowel obstruction, inflammatory process, or abnormal fluid collections. Vascular/Lymphatic: Sub-cm retroperitoneal lymph nodes in the left paraaortic, aortocaval and bilateral common iliac chains remains stable. No pathologically enlarged lymph nodes identified. No abdominal aortic aneurysm. Reproductive: Prior hysterectomy noted. Adnexal regions are unremarkable in appearance. Other:  None. Musculoskeletal: No evidence of bone metastases. Prior lumbar spine fusion hardware seen at L3-4 and L4-5. New osteolysis is seen involving the superior and inferior vertebral endplates at LI7-8 which is new since recent study. This is suspicious for infectious discitis. IMPRESSION: 1. Stable post treatment changes in presacral region. No evidence of recurrent or metastatic carcinoma within the abdomen or pelvis. 2. New osteolysis involving the superior and inferior vertebral endplates at LM7-6 suspicious for infectious discitis. Recommend clinical correlation, and consider lumbar spine MRI without and with contrast for further evaluation. These results will be called to the ordering clinician or representative by the Radiologist Assistant, and communication documented in the PACS or zVision Dashboard. Electronically Signed   By: JMarlaine HindM.D.   On: 06/18/2019 11:49    Labs:  CBC: Recent Labs    07/21/18 1343 01/20/19 1041 06/18/19 0831 07/01/19 0704  WBC 7.7 6.5 7.3 6.6  HGB 11.4* 11.8* 11.7* 11.7*  HCT 36.6 37.7 37.4 38.0  PLT 302 258 298 351    COAGS: Recent Labs    07/01/19 0704  INR 0.9    BMP: Recent Labs    07/20/18 1218  07/21/18 1343 01/20/19 1041 06/18/19 0831  NA 140 141 140 140  K 4.7 4.2 4.2 4.3  CL 105 106 106 104  CO2 26 27 26 28   GLUCOSE 104* 104* 110* 102*  BUN 14 11 12 15   CALCIUM 9.8 9.9 9.4 9.7  CREATININE 0.66 0.72 0.72 0.71  GFRNONAA >60 >60 >60 >60  GFRAA >60 >60 >60 >60    LIVER FUNCTION TESTS: Recent Labs    07/21/18 1343 01/20/19 1041 06/18/19 0831    BILITOT 0.3 0.4 0.3  AST 13* 12* 14*  ALT 18 17 14   ALKPHOS 100 93 93  PROT 7.2 7.1 7.4  ALBUMIN 3.7 3.7 3.6    TUMOR MARKERS: No results for input(s): AFPTM, CEA, CA199, CHROMGRNA in the last 8760 hours.  Assessment and Plan:  Hx rectal cancer 2016 Radiation  Tx Lumbar changes which was corrected with back surgery 02/2018 Did well after initial antibiotic treatment for infection Now with new back pain few months Abnormal MRI showing L3-4 edema Scheduled now for disc aspiration Risks and benefits of L3-4 disc aspiration was discussed with the patient and/or patient's family including, but not limited to bleeding, infection, damage to adjacent structures or low yield requiring additional tests.  All of the questions were answered and there is agreement to proceed.  Consent signed and in chart.   Thank you for this interesting consult.  I greatly enjoyed meeting Lindsay Little and look forward to participating in their care.  A copy of this report was sent to the requesting provider on this date.  Electronically Signed: Lavonia Drafts, PA-C 07/01/2019, 8:15 AM   I spent a total of  30 Minutes   in face to face in clinical consultation, greater than 50% of which was counseling/coordinating care for L3-4 disc aspiration

## 2019-07-01 NOTE — Procedures (Signed)
PROCEDURE SUMMARY:  Successful placement of single lumen PICC line to right basilic vein. Length 46cm Tip at lower SVC/RA No complications PICC capped Ready for use. EBL = trace  Please see full dictation in Imaging section for details.   Novalie Leamy S Sammy Cassar PA-C 07/01/2019 9:22 AM

## 2019-07-01 NOTE — Discharge Instructions (Signed)
Needle Biopsy, Care After These instructions tell you how to care for yourself after your procedure. Your doctor may also give you more specific instructions. Call your doctor if you have any problems or questions. What can I expect after the procedure? After the procedure, it is common to have:  Soreness.  Bruising.  Mild pain. Follow these instructions at home:   Return to your normal activities as told by your doctor. Ask your doctor what activities are safe for you.  Take over-the-counter and prescription medicines only as told by your doctor.  Wash your hands with soap and water before you change your bandage (dressing). If you cannot use soap and water, use hand sanitizer.  Follow instructions from your doctor about: ? How to take care of your puncture site. ? When and how to change your bandage. ? When to remove your bandage.  Check your puncture site every day for signs of infection. Watch for: ? Redness, swelling, or pain. ? Fluid or blood. ? Pus or a bad smell. ? Warmth.  Do not take baths, swim, or use a hot tub until your doctor approves. Ask your doctor if you may take showers. You may only be allowed to take sponge baths.  Keep all follow-up visits as told by your doctor. This is important. Contact a doctor if you have:  A fever.  Redness, swelling, or pain at the puncture site, and it lasts longer than a few days.  Fluid, blood, or pus coming from the puncture site.  Warmth coming from the puncture site. Get help right away if:  You have a lot of bleeding from the puncture site. Summary  After the procedure, it is common to have soreness, bruising, or mild pain at the puncture site.  Check your puncture site every day for signs of infection, such as redness, swelling, or pain.  Get help right away if you have severe bleeding from your puncture site. This information is not intended to replace advice given to you by your health care provider. Make  sure you discuss any questions you have with your health care provider. Document Released: 06/13/2008 Document Revised: 07/14/2017 Document Reviewed: 07/14/2017 Elsevier Patient Education  2020 [place]. Moderate Conscious Sedation, Adult, Care After These instructions provide you with information about caring for yourself after your procedure. Your health care provider may also give you more specific instructions. Your treatment has been planned according to current medical practices, but problems sometimes occur. Call your health care provider if you have any problems or questions after your procedure. What can I expect after the procedure? After your procedure, it is common:  To feel sleepy for several hours.  To feel clumsy and have poor balance for several hours.  To have poor judgment for several hours.  To vomit if you eat too soon. Follow these instructions at home: For at least 24 hours after the procedure:   Do not: ? Participate in activities where you could fall or become injured. ? Drive. ? Use heavy machinery. ? Drink alcohol. ? Take sleeping pills or medicines that cause drowsiness. ? Make important decisions or sign legal documents. ? Take care of children on your own.  Rest. Eating and drinking  Follow the diet recommended by your health care provider.  If you vomit: ? Drink water, juice, or soup when you can drink without vomiting. ? Make sure you have little or no nausea before eating solid foods. General instructions  Have a responsible adult stay with  you until you are awake and alert.  Take over-the-counter and prescription medicines only as told by your health care provider.  If you smoke, do not smoke without supervision.  Keep all follow-up visits as told by your health care provider. This is important. Contact a health care provider if:  You keep feeling nauseous or you keep vomiting.  You feel light-headed.  You develop a rash.  You  have a fever. Get help right away if:  You have trouble breathing. This information is not intended to replace advice given to you by your health care provider. Make sure you discuss any questions you have with your health care provider. Document Released: 04/21/2013 Document Revised: 06/13/2017 Document Reviewed: 10/21/2015 Elsevier Patient Education  2020 [place] Guide  A peripherally inserted central catheter (PICC) is a form of IV access that allows medicines and IV fluids to be quickly distributed throughout the body. The PICC is a long, thin, flexible tube (catheter) that is inserted into a vein in the upper arm. The catheter ends in a large vein in the chest (superior vena cava, or SVC). After the PICC is inserted, a chest X-ray may be done to make sure that it is in the correct place. A PICC may be placed for different reasons, such as:  To give medicines and liquid nutrition.  To give IV fluids and blood products.  If there is trouble placing a peripheral intravenous (PIV) catheter. If taken care of properly, a PICC can remain in place for several months. Having a PICC can also allow a person to go home from the hospital sooner. Medicine and PICC care can be managed at home by a family member, caregiver, or home health care team. What are the risks? Generally, having a PICC is safe. However, problems may occur, including:  A blood clot (thrombus) forming in or at the tip of the PICC.  A blood clot forming in a vein (deep vein thrombosis) or traveling to the lung (pulmonary embolism).  Inflammation of the vein (phlebitis) in which the PICC is placed.  Infection. Central line associated blood stream infection (CLABSI) is a serious infection that often requires hospitalization.  PICC movement (malposition). The PICC tip may move from its original position due to excessive physical activity, forceful coughing, sneezing, or vomiting.  A break or cut in the  PICC. It is important not to use scissors near the PICC.  Nerve or tendon irritation or injury during PICC insertion. How to take care of your PICC Preventing problems  You and any caregivers should wash your hands often with soap. Wash hands: ? Before touching the PICC line or the infusion device. ? Before changing a bandage (dressing).  Flush the PICC as told by your health care provider. Let your health care provider know right away if the PICC is hard to flush or does not flush. Do not use force to flush the PICC.  Do not use a syringe that is less than 10 mL to flush the PICC.  Avoid blood pressure checks on the arm in which the PICC is placed.  Never pull or tug on the PICC.  Do not take the PICC out yourself. Only a trained clinical professional should remove the PICC.  Use clean and sterile supplies only. Keep the supplies in a dry place. Do not reuse needles, syringes, or any other supplies. Doing that can lead to infection.  Keep pets and children away from your PICC line.  Check the PICC insertion site every day for signs of infection. Check for: ? Leakage. ? Redness, swelling, or pain. ? Fluid or blood. ? Warmth. ? Pus or a bad smell. PICC dressing care  Keep your PICC bandage (dressing) clean and dry to prevent infection.  Do not take baths, swim, or use a hot tub until your health care provider approves. Ask your health care provider if you can take showers. You may only be allowed to take sponge baths for bathing. When you are allowed to shower: ? Ask your health care provider to teach you how to wrap the PICC line. ? Cover the PICC line with clear plastic wrap and tape to keep it dry while showering.  Follow instructions from your health care provider about how to take care of your insertion site and dressing. Make sure you: ? Wash your hands with soap and water before you change your bandage (dressing). If soap and water are not available, use hand  sanitizer. ? Change your dressing as told by your health care provider. ? Leave stitches (sutures), skin glue, or adhesive strips in place. These skin closures may need to stay in place for 2 weeks or longer. If adhesive strip edges start to loosen and curl up, you may trim the loose edges. Do not remove adhesive strips completely unless your health care provider tells you to do that.  Change your PICC dressing if it becomes loose or wet. General instructions   Carry your PICC identification card or wear a medical alert bracelet at all times.  Keep the tube clamped at all times, unless it is being used.  Carry a smooth-edge clamp with you at all times to place on the tube if it breaks.  Do not use scissors or sharp objects near the tube.  You may bend your arm and move it freely. If your PICC is near or at the bend of your elbow, avoid activity with repeated motion at the elbow.  Avoid lifting heavy objects as told by your health care provider.  Keep all follow-up visits as told by your health care provider. This is important. Disposal of supplies  Throw away any syringes in a disposal container that is meant for sharp items (sharps container). You can buy a sharps container from a pharmacy, or you can make one by using an empty hard plastic bottle with a cover.  Place any used dressings or infusion bags into a plastic bag. Throw that bag in the trash. Contact a health care provider if:  You have pain in your arm, ear, face, or teeth.  You have a fever or chills.  You have redness, swelling, or pain around the insertion site.  You have fluid or blood coming from the insertion site.  Your insertion site feels warm to the touch.  You have pus or a bad smell coming from the insertion site.  Your skin feels hard and raised around the insertion site. Get help right away if:  Your PICC is accidentally pulled all the way out. If this happens, cover the insertion site with a  bandage or gauze dressing. Do not throw the PICC away. Your health care provider will need to check it.  Your PICC was tugged or pulled and has partially come out. Do not  push the PICC back in.  You cannot flush the PICC, it is hard to flush, or the PICC leaks around the insertion site when it is flushed.  You hear a "flushing" sound  when the PICC is flushed.  You feel your heart racing or skipping beats.  There is a hole or tear in the PICC.  You have swelling in the arm in which the PICC was inserted.  You have a red streak going up your arm from where the PICC was inserted. Summary  A peripherally inserted central catheter (PICC) is a long, thin, flexible tube (catheter) that is inserted into a vein in the upper arm.  The PICC is inserted using a sterile technique by a specially trained nurse or physician. Only a trained clinical professional should remove it.  Keep your PICC identification card with you at all times.  Avoid blood pressure checks on the arm in which the PICC is placed.  If cared for properly, a PICC can remain in place for several months. Having a PICC can also allow a person to go home from the hospital sooner. This information is not intended to replace advice given to you by your health care provider. Make sure you discuss any questions you have with your health care provider. Document Released: 01/05/2003 Document Revised: 06/13/2017 Document Reviewed: 08/03/2016 Elsevier Patient Education  2020 [employer].

## 2019-07-01 NOTE — Telephone Encounter (Signed)
Lindsay Little can we also set her up with an in person clinic appt. It cant be with me until January. If expense is high we could start with just ceftriaxone and use oral doxy in place of vanco until we have more culture data

## 2019-07-01 NOTE — Telephone Encounter (Signed)
Pt was told her appointment with ID will be on 1/5. Could you help to move her appointment to tomorrow or early next week? I will send a formal urgent consult over. Thanks for your help!  Truitt Merle MD

## 2019-07-01 NOTE — Telephone Encounter (Signed)
Lindsay Mood, do you know if she has home care service now? Could you find out? Thanks   Truitt Merle MD

## 2019-07-01 NOTE — Telephone Encounter (Signed)
[facility] states patient's out of pocket daily expense will be ~$230/day, as it appears she has not yet met a deductible.  RN asked Sharrie Rothman at [facility] to see if they are able to get a better price, as patient has [employer].

## 2019-07-02 ENCOUNTER — Telehealth: Payer: Self-pay

## 2019-07-02 ENCOUNTER — Telehealth: Payer: Self-pay | Admitting: General Practice

## 2019-07-02 ENCOUNTER — Telehealth: Payer: Self-pay | Admitting: Hematology

## 2019-07-02 ENCOUNTER — Ambulatory Visit: Payer: Medicare Other

## 2019-07-02 ENCOUNTER — Other Ambulatory Visit: Payer: Self-pay | Admitting: Family

## 2019-07-02 DIAGNOSIS — M545 Low back pain: Secondary | ICD-10-CM | POA: Diagnosis not present

## 2019-07-02 DIAGNOSIS — M4646 Discitis, unspecified, lumbar region: Secondary | ICD-10-CM

## 2019-07-02 MED ORDER — LEVOFLOXACIN 750 MG PO TABS: 750.0000 mg | ORAL_TABLET | Freq: Every day | ORAL | 0 refills | Status: DC

## 2019-07-02 MED ORDER — DOXYCYCLINE HYCLATE 100 MG PO TABS: 100.0000 mg | ORAL_TABLET | Freq: Two times a day (BID) | ORAL | 0 refills | Status: DC

## 2019-07-02 NOTE — Addendum Note (Signed)
Addended by: Truitt Merle on: 07/02/2019 04:45 PM   Modules accepted: Orders

## 2019-07-02 NOTE — Telephone Encounter (Signed)
I do not have any clinic availability next week but one of my partners might be able to possibly squeeze her in

## 2019-07-02 NOTE — Telephone Encounter (Signed)
Please advise on antibiotic orders for patient. Her PICC was placed 12/17, needs set up for PICC maintenance and flushing.

## 2019-07-02 NOTE — Progress Notes (Signed)
Spoke with Lindsay Little regarding the treatment plan for her lumbar discitis and limitations of her insurance coverage for Home Health. Cultures obtained from aspiration are without growth to date indicating the need to treat broad spectrum at present. Will prescribe bioavailable Doxycycline 100 mg bid and Levofloxacin 750 mg daily. She will come to [facility] on 12/22 @ 10 am to have her PICC line removed following drawing of inflammatory markers. She will continue with her follow up appointment with Dr. Tommy Medal on 07/20/19

## 2019-07-02 NOTE — Telephone Encounter (Signed)
Lindsay Little is on top of this unfortunately this patient has some significant issues in terms of her insurance coverage and lack of coverage for nursing care and high out-of-pocket expense regardless of the IV antibiotics.  It does not look like we have options that are not going to require her to spend over thousand dollars and less we do oral antibiotics Lindsay Little will send a note later today

## 2019-07-02 NOTE — Telephone Encounter (Signed)
Attempted to contact Lindsay Little and received a voicemail. Left HIPPA compliant message to return call and will try again this afternoon.

## 2019-07-02 NOTE — Telephone Encounter (Signed)
Patient called triage trying to reach Marya Amsler, NP. Patient reports that his calls aren't "going through" and didn't have his number. Informed patient that we would reach out to [facility] to contact via MyChart or leave a voicemail with a callback number.   Iisha Soyars Lorita Officer, RN

## 2019-07-02 NOTE — Telephone Encounter (Signed)
Dr. Burr Medico,  I have discussed [name]'s case with our pharmacy staff and Dr. Tommy Little and we have determined that it is not the cost of the medications that is prohibiting [place] from proceeding with treatment, but the insurance that is not covering the at home care for IV therapy. An alternative regimen of having [name] go to short stay to receive infusions would also result in a $1000 per week bill as well. At the present time the recommendation is going to be for oral treatment with bioavailable medications to include doxycycline 100 mg twice daily and levofloxacin 750 mg once daily. I will call [name] to explain to her the treatment plan and work to get her into the office for follow up after starting medication. Please let me know if you have any questions.   Regards,  Lindsay Little

## 2019-07-02 NOTE — Telephone Encounter (Signed)
Greg and Dr. Tommy Medal,  I very much appreciate your help! I have asked my financial staff to review her case and did not find any financial assistance available either.   I checked with path today, unfortunately no biopsy sample was sent to path, so we would not know if this is cancer recurrence, although the clinic suspicion is low.   Please remove her PICC line when you see her next week.   Truitt Merle MD

## 2019-07-02 NOTE — Telephone Encounter (Signed)
[place] CSW Progress Notes  [place] Initial Psychosocial Assessment Clinical Social Work  Clinical Social Work contacted by phone to assess psychosocial, emotional, mental health, and spiritual needs of the patient.   Barriers to care/review of distress screen:  - Transportation:  Do you anticipate any problems getting to appointments?  Do you have someone who can help run errands for you if you need it? - Help at home:  What is your living situation (alone, family, other)?  If you are physically unable to care for yourself, who would you call on to help you?  Lives w husband and two 83 year olds, one w cerebral palsy.  Husband has morphine pump.  Feels "daughter is more a mom than I am these days, she is taking care of everyone."  Son w CP has developmental delays, has a home nurse but "they have all been out w their own family issues."   - Support system:  What does your support system look like?  Who would you call on if you needed some kind of practical help?  What if you needed someone to talk to for emotional support?  Has spiritual support through church, limited practical support.   - Finances:  Are you concerned about finances (I know, we may not want to go there.Marland Kitchen).  Considering returning to work?  If not, applying for disability?  Is working from home, sitting in chair, works 8 hours/day for [employer].  Pain is getting in the way of doing her job.  Is on disability, "I want to get off it."  Disability income is severely limited as compared to what she used to make when employed.    What is your understanding of where you are with your cancer? Its cause?  Your treatment plan and what happens next?  In surveillance for "went from being fine and able to walk - now I cannot walk, am in pain, bedridden."  Cant walk/sit for any length of time.  Significant increase in use of pain meds, now on morphine.  "Things happen but why is is always happening to me."  Frustrated by recurrent infections,  now has permanent colostomy.  "This is the second time Ive tried to work in 4 years", pain has interfered with her ability to work.  "Just when I thought we were getting back on our feet, it has come crashing down again."    What are your worries for the future as you begin treatment for cancer?  Very worried that her cancer has returned, life has been upended by various physical challenges.  Pain is disabling for her.  Does not want her childrens lives impacted by her illness, wants to function well as their mother.  Financial stress.    CSW Summary:  Patient and family psychosocial functioning including strengths, limitations, and coping skills:  Married mother of two 80 year old children, one disabled by cerebral palsy.  Husband disabled and in chronic pain.  In surveillance for previous treatment of colorectal cancer.  Has colostomy.  New and severe pain, inhibiting her ability to work at home and other activities.  Has less help w care needs of son - nurses who usually come into the home have been unable to work. Less support for family in general due to COVID precautions.  Patient expresses that "things just never go back to normal", "something else always happens."  Understandably frustrated with multiple serious medical issues in her life as well as the life of her family members.  Limited support from outside.  Relies on faith resources to cope.  Trying to regain ability to work and get off disability.  Unsure of current situation, understandably worried about possibility of reoccurance of cancer - has been told she has chronic infection but is not sure whether to trust this diagnosis.   Identifications of barriers to care:  Very limited practical support in context of significant practical and emotional needs.  Dealing with chronic pain and uncertainty.  Availability of community resources: Unable to assess, will need further information.  Complex situation  Clinical Social Worker follow up  needed: Yes.    Will email her Support Team contact information and plan to follow up by phone, also alerted CSW [facility] to assist.  Edwyna Shell, [place] Worker Phone:  [phone] Cell:  [phone]

## 2019-07-02 NOTE — Telephone Encounter (Signed)
Patient underwent L3-4 aspiration by IR Dr. Estanislado Pandy yesterday, sample was sent to micro lab, but not cytology. I spoke with Dr. Estanislado Pandy and he indicated that pt will require general anesthesia for bone biopsy if needed.  I have contacted micro lab, and request the left over sample to be sent to pathology for cytology.  Order was placed and verified.  I informed patient about above, will follow-up cytology results.  Patient will start oral antibiotics per ID, due to the lack of coverage of home care and high out-of-pocket cost for IV antibiotics.   She knows to call me if there is anything else I can help her.   Truitt Merle  07/02/2019

## 2019-07-05 ENCOUNTER — Ambulatory Visit: Payer: 59 | Admitting: *Deleted

## 2019-07-05 ENCOUNTER — Other Ambulatory Visit: Payer: Self-pay

## 2019-07-05 DIAGNOSIS — M4646 Discitis, unspecified, lumbar region: Secondary | ICD-10-CM

## 2019-07-05 LAB — CYTOLOGY - NON PAP

## 2019-07-05 NOTE — Progress Notes (Signed)
Patient in clinic for lab draw and PICC removal today. She states she is taking the oral antibiotics, that she has some nausea but is tolerating it ok right now. She is scheduled for follow up 07/20/19 with Dr Tommy Medal.

## 2019-07-05 NOTE — Progress Notes (Signed)
Thanks Michelle

## 2019-07-05 NOTE — Progress Notes (Unsigned)
Per verbal order from Terri Piedra, NP, labs drawn then 46 cm Single Lumen Peripherally Inserted Central Catheter removed from right basilic, tip intact. No sutures present. RN confirmed length per chart. Patient had removed the PICC dressing overnight, taped it back per her report, although this was removed by the patient in the waiting room before she was brought back.  No dressing, no biopatch on her insertion site. RN cleaned the site with 3 chg swabs. No obvious signs of infection at the site.  Petroleum dressing applied. Pt advised no heavy lifting with this arm, leave dressing for 24 hours and call the office or seek emergent care if dressing becomes soaked with blood or sharp pain presents. Patient verbalized understanding and agreement.  Patient's questions answered to their satisfaction, follow up appointment confirmed. Patient tolerated procedure well, RN walked patient to check out. Landis Gandy, RN

## 2019-07-06 LAB — AEROBIC/ANAEROBIC CULTURE W GRAM STAIN (SURGICAL/DEEP WOUND)
Culture: NORMAL
Gram Stain: NONE SEEN

## 2019-07-06 LAB — SEDIMENTATION RATE: Sed Rate: 86 mm/h — ABNORMAL HIGH (ref 0–30)

## 2019-07-06 LAB — C-REACTIVE PROTEIN: CRP: 67 mg/L — ABNORMAL HIGH (ref <8.0)

## 2019-07-07 ENCOUNTER — Telehealth: Payer: Self-pay | Admitting: Nurse Practitioner

## 2019-07-07 NOTE — Telephone Encounter (Signed)
Contacted patient to verify mychart visit for pre reg 

## 2019-07-08 ENCOUNTER — Encounter: Payer: Self-pay | Admitting: Nurse Practitioner

## 2019-07-08 ENCOUNTER — Inpatient Hospital Stay (HOSPITAL_BASED_OUTPATIENT_CLINIC_OR_DEPARTMENT_OTHER): Payer: 59 | Admitting: Nurse Practitioner

## 2019-07-08 DIAGNOSIS — M4646 Discitis, unspecified, lumbar region: Secondary | ICD-10-CM

## 2019-07-08 MED ORDER — MORPHINE SULFATE 30 MG PO TABS: 30.0000 mg | ORAL_TABLET | Freq: Four times a day (QID) | ORAL | 0 refills | Status: DC | PRN

## 2019-07-08 NOTE — Progress Notes (Signed)
[place]   [phone] [phone]   Clinic Follow up Note   Patient Care Team: [name], MD as PCP - General (Family Medicine) Jerline Pain, MD as PCP - Cardiology (Cardiology) Michael Boston, MD as Consulting Physician (General Surgery) Truitt Merle, MD as Consulting Physician (Medical Oncology) Kyung Rudd, MD as Consulting Physician (Radiation Oncology) Teena Irani, MD (Inactive) as Consulting Physician (Gastroenterology) Raynelle Bring, MD as Consulting Physician (Urology) Eustace Moore, MD as Consulting Physician (Neurosurgery) 07/08/2019  I connected with Lindsay Little on 07/08/19 at  3:15 PM EST by telephone visit and verified that I am speaking with the correct person using two identifiers.   I discussed the limitations, risks, security and privacy concerns of performing an evaluation and management service by telemedicine and the availability of in-person appointments. I also discussed with the patient that there may be a patient responsible charge related to this service. The patient expressed understanding and agreed to proceed.   Other persons participating in the visit and their role in the encounter: None  Patient's location: Home Provider's location: [facility] office   Chief Complaint: f/u back pain   SUMMARY OF ONCOLOGIC HISTORY: Oncology History Overview Note  Rectal adenocarcinoma   Staging form: Colon and Rectum, AJCC 7th Edition     Clinical: T3, N2, M0 - Unsigned     Rectal adenocarcinoma s/p LAR resection 05/25/2015  01/13/2015 Initial Diagnosis   Rectal adenocarcinoma   01/13/2015 Procedure   Colonoscopy showed a sensitivity O nonobstructing mass in the rectum and from 12-18 cm proximal to the Annis. The mass was circumferential, measuring about 6 cm in length. EGD was negative.   01/13/2015 Cancer Staging   Staging form: Colon and Rectum, AJCC 7th Edition - Clinical stage from 01/13/2015: T3, N2, M0 - Signed by  Truitt Merle, MD on 12/15/2017   01/17/2015 Tumor Marker   CEA 1.4, CA-19-9 8, MMR normal    01/18/2015 Procedure   Lower endoscopic ultrasound by Dr. Paulita Fujita showed a T3 N2 rectal mass.   01/20/2015 Imaging   CT abdomen and pelvis with contrast showed right lateral rectal wall exophytic mass, and 2 ill-defined hepatic hypoenhancing lesions which appears to correspond to the lesion seen on the prior MRI in 2015.   02/05/2015 Imaging   abdomen MRI showed 2 hemangioma, no suspicion for metastatic disease. CT chest was negative   02/20/2015 - 03/30/2015 Radiation Therapy   neoadjuvant RT to rectal cancer    02/20/2015 Concurrent Chemotherapy   capecitabine 2500 mg in the morning and 2000 mg in the evening (81m/m2, bid), on the day of radiation.    05/25/2015 Surgery   Recto-sigmoid segmental resection, margins are negative    05/25/2015 Pathology Results   0.2cm residual invasive adenocarcinoma, G2, negative margins, 12 nodes were negative    05/25/2015 Cancer Staging   Staging form: Colon and Rectum, AJCC 7th Edition - Pathologic stage from 05/25/2015: Stage I (T1, N0, cM0) - Signed by FTruitt Merle MD on 12/15/2017   06/23/2015 - 06/29/2015 Hospital Admission   Patient was admitted for pelvic abscess, drain placed and she was treated with IV antibiotics, she also received 1 dose of Feraheme for anemia.   09/15/2015 - 09/17/2015 Hospital Admission   Recurrent pelvic collection s/p perc drainage 09/16/2015   02/21/2016 Imaging   CT abdomen and pelvis w contrast IMPRESSION: 1. Interval removal of surgical drains since 12/19/2015 from the presacral space. Similar size of ill-defined presacral fluid and gas, for  which residual abscess cannot be excluded. Similar amount of intraperitoneal edema throughout the upper pelvis with foci of extraluminal gas, possibly related to the presacral process. 2. Diverting right-sided ileostomy, without acute complication. 3. Similar moderate hydroureteronephrosis, likely  due to the pelvic process. 4. Possible bladder wall thickening. Correlate with urinalysis. This appearance could be partially due to underdistention. 5. Geographic hepatic steatosis and hemangiomas. Cannot exclude a new right hepatic lobe lesion. Consider nonemergent pre and post contrast outpatient MRI. This could represent a new area of focal steatosis.   05/31/2016 Surgery   ileostomy takedown BY Dr. Johney Maine    07/04/2016 Imaging   CT Abdomen Pelvis W Contrast 07/04/16 IMPRESSION: Subcutaneous fluid collection at the site of ileostomy takedown, could be sterile or infected, measuring 9.3 x 4.3 x 5.3 cm; this could be aspirated under ultrasound guidance if clinically indicated. Large collection of stool within the presacral space, could potentially be within a distended distorted rectum but since this occupies the same position as the abscess collection identified on the previous CT this is suspicious for a contiguous contained extraluminal stool collection communicating with the rectosigmoid colon, collection overall roughly 7 x 8 x 9 cm.   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   12/26/2016 Imaging   IMPRESSION: 1. Stable CT exam of the chest.  No evidence for metastatic disease. 2. Interval left colectomy with left abdominal end colostomy. Presacral soft tissue is similar to prior study when taking into account the interval surgery. 3. Hepatic dome lesion seen on the previous study not evident today, potentially related to bolus timing. The 2.4 cm subcapsular inferior right liver lesion remains subtle and is barely visible on today's exam but shows no substantial interval change in size. Continued attention on follow-up recommended.    12/09/2017 Imaging   CT CAP W Contrast 12/09/17  IMPRESSION: 1. Stable post treatment changes in the presacral space with no evidence of recurrent disease in the  pelvis. 2. No evidence of metastatic disease in the chest, abdomen or pelvis. 3. Large parastomal hernia has increased in size and contains much of the remnant left colon. No evidence of bowel obstruction or ischemia. 4. Stable dilated main pulmonary artery, suggesting chronic pulmonary arterial hypertension. 5.  Aortic Atherosclerosis (ICD10-I70.0).   12/31/2017 Genetic Testing   The Multi-Cancer Panel offered by [facility] includes sequencing and/or deletion duplication testing of the following 83 genes: ALK, APC, ATM, AXIN2,BAP1,  BARD1, BLM, BMPR1A, BRCA1, BRCA2, BRIP1, CASR, CDC73, CDH1, CDK4, CDKN1B, CDKN1C, CDKN2A (p14ARF), CDKN2A (p16INK4a), CEBPA, CHEK2, CTNNA1, DICER1, DIS3L2, EGFR (c.2369C>T, p.Thr790Met variant only), EPCAM (Deletion/duplication testing only), FH, FLCN, GATA2, GPC3, GREM1 (Promoter region deletion/duplication testing only), HOXB13 (c.251G>A, p.Gly84Glu), HRAS, KIT, MAX, MEN1, MET, MITF (c.952G>A, p.Glu318Lys variant only), MLH1, MSH2, MSH3, MSH6, MUTYH, NBN, NF1, NF2, NTHL1, PALB2, PDGFRA, PHOX2B, PMS2, POLD1, POLE, POT1, PRKAR1A, PTCH1, PTEN, RAD50, RAD51C, RAD51D, RB1, RECQL4, RET, RUNX1, SDHAF2, SDHA (sequence changes only), SDHB, SDHC, SDHD, SMAD4, SMARCA4, SMARCB1, SMARCE1, STK11, SUFU, TERC, TERT, TMEM127, TP53, TSC1, TSC2, VHL, WRN and WT1.   Results: Negative, no pathogenic variants identified.  The date of this test report is 12/31/2017.    02/02/2019 Imaging   CT CAP W Contrast  IMPRESSION: 1. Status post descending colostomy, without recurrent or metastatic disease. 2. Presacral soft tissue thickening is similar in configuration. New tiny foci of gas within the presacral soft tissues, nonspecific. No well-defined fistulous communication to bowel identified. 3. Possible  constipation. 4. Probable mild hepatic steatosis. 5. Interval repair of parastomal hernia.   Aortic Atherosclerosis (ICD10-I70.0).   06/18/2019 Imaging   CT AP W Contrast  IMPRESSION: 1.  Stable post treatment changes in presacral region. No evidence of recurrent or metastatic carcinoma within the abdomen or pelvis. 2. New osteolysis involving the superior and inferior vertebral endplates at M4-2, suspicious for infectious discitis. Recommend clinical correlation, and consider lumbar spine MRI without and with contrast for further evaluation.   These results will be called to the ordering clinician or representative by the Radiologist Assistant, and communication documented in the PACS or zVision Dashboard.    06/26/2019 Imaging    MRI Lumbar 06/26/19  IMPRESSION: Extensive abnormality at L3-4 with bone marrow edema and enhancement as well as extensive endplate irregularity. There is paraspinous soft tissue thickening and enhancement as well as epidural thickening and enhancement. Findings are most likely due to chronic infection. Another possibility would be severe adjacent segment degeneration above the level of fusion at L4-5. Metastatic disease not considered likely.     INTERVAL HISTORY: Ms. Czaplicki presents for phone visit to discuss pain control. Back pain continues to be severe without pain meds, sometimes 9-10/10. She takes MSIR q6 which does not touch her pain. So has no adverse effects from morphine. She admits that after 3 hours she takes Norco, this combination is more effective and she gets pain relief and can ambulate/be more active. She continues regular meds cymbalta and gabapentin which she takes 3-4 times daily. She continues oral antibiotics doxy BID and levaquin daily. She does not feel any improvement yet. She was told at least 6 weeks on antibiotics. She has f/u with ID 1/5. She has generalized abdominal pain and soreness like after exercise. Pain meds do not help this. Denies constipation, nausea or vomiting.     MEDICAL HISTORY:  Past Medical History:  Diagnosis Date  . Anxiety   . Chemotherapy-induced neuropathy (HCC)    toes and fingers  numbness and tingling  . Colorectal delayed anastomotic leak s/p resection & colostomy 07/12/2016 11/17/2015  . Colostomy in place [facility]) 07/12/2016   due to anastomosis breakdown w/ colovaginal fistula  . Colovaginal fistula s/p omentopexy repair 07/12/2016 07/10/2016  . Complication of anesthesia   . Depression   . Family history of adverse reaction to anesthesia    mother-- ponv  . Family history of breast cancer   . Family history of pancreatic cancer   . Family history of skin cancer   . Gastroenteritis 12/20/2015  . GERD (gastroesophageal reflux disease)   . Hiatal hernia   . History of cancer chemotherapy 02-20-2015 to 03-30-2015  . History of cardiac murmur as a child   . History of chemotherapy   . History of chronic gastritis   . History of hypertension    no issues since multiple abdminal sx's and chemo--- no medication since 12/ 2016  . History of TMJ disorder   . Hypercholesteremia   . Hypertension   . IBS (irritable bowel syndrome)    dx age 88  . Intermittent abdominal pain    post-op multiple abdominal sx's   . Microcytic anemia   . Mild sleep apnea    per pt study 2014  very mild osa , no cpap recommended, recommeded wt loss and sleep routine  . OA (osteoarthritis)    left knee /  left shoulder  . Obesity   . PONV (postoperative nausea and vomiting)    severe" needs Scopolamine PATCH"   .  Portacath in place    right chest  . Rectal adenocarcinoma ([place]) oncologis-  dr Burr Medico-- after radiation/ chemo (ypT1, N0) --  no recurrence per last note 03/ 2018   dx 01-13-2015-- Stage IIIC (T3, N2, M0) post concurrent radiation and chemotherapy 02-20-2015 to 03-30-2015 /  05-25-2015 s/p  LAR w/ RSO (post-op complicated by late abscess and contained anatomotic leak w/ help percutaneous drainage and antibiotics)   . Rotator cuff tear, left   . S/P radiation therapy 02/20/15-03/30/15   colon/rectal  . Vitamin D deficiency   . Wears glasses   . Wears glasses     SURGICAL  HISTORY: Past Surgical History:  Procedure Laterality Date  . ABDOMINAL HYSTERECTOMY  1996   uterus and cervix  . BACK SURGERY  02/12/2018   lumbar surgery  . COLON RESECTION N/A 07/12/2016   Procedure: LAPAROSCOPIC LYSIS OF ADHESIONS, OMENTOPEXY, HAND ASSISTED RESECTION OF  COLON, END TO END ANASTOMOSIS, COLOSTOMY;  Surgeon: Michael Boston, MD;  Location: [facility];  Service: General;  Laterality: N/A;  . COMBINED HYSTEROSCOPY DIAGNOSTIC / D&C  x2 1990's  . DIAGNOSTIC LAPAROSCOPY  age 67 and age 67  . EUS N/A 01/18/2015   Procedure: LOWER ENDOSCOPIC ULTRASOUND (EUS);  Surgeon: Arta Silence, MD;  Location: [facility];  Service: Endoscopy;  Laterality: N/A;  . EXCISION OF SKIN TAG  11/17/2015   Procedure: EXCISION OF SKIN TAG;  Surgeon: Michael Boston, MD;  Location: [facility];  Service: General;;  . ILEO LOOP COLOSTOMY CLOSURE N/A 11/17/2015   Procedure: LAPAROSCOPIC DIVERTING LOOP ILEOSTOMY  DRAINAGE OF PELVIC ABSCESS;  Surgeon: Michael Boston, MD;  Location: [facility];  Service: General;  Laterality: N/A;  . ILEOSTOMY CLOSURE N/A 05/31/2016   Procedure: TAKEDOWN LOOP ILEOSTOMY;  Surgeon: Michael Boston, MD;  Location: [facility];  Service: General;  Laterality: N/A;  . IMPACTION REMOVAL  11/17/2015   Procedure: DISIMPACTION REMOVAL;  Surgeon: Michael Boston, MD;  Location: [facility];  Service: General;;  . INSERTION OF MESH N/A 07/23/2018   Procedure: INSERTION OF MESH X2;  Surgeon: Michael Boston, MD;  Location: [facility];  Service: General;  Laterality: N/A;  . IR LUMBAR [place] W/IMG GUIDE  07/01/2019  . KNEE ARTHROSCOPY Left 1990's  . KNEE ARTHROSCOPY W/ MENISCECTOMY Left 09/14/2009   and chondroplasty debridement  . LAPAROSCOPIC LYSIS OF ADHESIONS  11/17/2015   Procedure: LAPAROSCOPIC LYSIS OF ADHESIONS;  Surgeon: Michael Boston, MD;  Location: [facility];  Service: General;;  . LUMBAR WOUND DEBRIDEMENT N/A 04/24/2018   Procedure: wound exploration, irrigation and debridement;  Surgeon: Eustace Moore, MD;   Location: [facility];  Service: Neurosurgery;  Laterality: N/A;  wound exploration, irrigation and debridement  . LYSIS OF ADHESION N/A 07/23/2018   Procedure: LYSIS OF ADHESIONS;  Surgeon: Michael Boston, MD;  Location: [facility];  Service: General;  Laterality: N/A;  . PORT-A-CATH REMOVAL N/A 11/21/2016   Procedure: REMOVAL PORT-A-CATH;  Surgeon: Michael Boston, MD;  Location[facility];  Service: General;  Laterality: N/A;  . PORTACATH PLACEMENT N/A 05/25/2015   Procedure: INSERTION PORT-A-CATH;  Surgeon: Michael Boston, MD;  Location: [facility];  Service: General;  Laterality: N/A;-remains inplace Right chest.  . ROTATOR CUFF REPAIR Right 2006  . TONSILLECTOMY  age 87  . VENTRAL HERNIA REPAIR N/A 07/23/2018   Procedure: LAPAROSCOPIC VENTRAL WALL HERNIA REPAIR;  Surgeon: Michael Boston, MD;  Location: [facility];  Service: General;  Laterality: N/A;  . XI ROBOTIC ASSISTED LOWER ANTERIOR RESECTION N/A 05/25/2015  Procedure: XI ROBOTIC ASSISTED LOWER ANTERIOR RESECTION, , RIGID PROCTOSCOPY, RIGHT OOPHORECTOMY;  Surgeon: Michael Boston, MD;  Location: [facility];  Service: General;  Laterality: N/A;    I have reviewed the social history and family history with the patient and they are unchanged from previous note.  ALLERGIES:  is allergic to caine-1 [lidocaine]; sulfa antibiotics; adhesive [tape]; doxycycline; flagyl [metronidazole]; iron; oxycodone; and penicillins.  MEDICATIONS:  Current Outpatient Medications  Medication Sig Dispense Refill  . Cholecalciferol (VITAMIN D3) 5000 units CAPS Take 5,000 Units by mouth at bedtime.     . Coenzyme Q10 (COQ10) 200 MG CAPS Take 200 mg by mouth at bedtime.    Marland Kitchen doxycycline (VIBRA-TABS) 100 MG tablet Take 1 tablet (100 mg total) by mouth 2 (two) times daily. 60 tablet 0  . DULoxetine (CYMBALTA) 60 MG capsule Take 120 mg by mouth at bedtime.     Marland Kitchen esomeprazole (NEXIUM) 40 MG capsule Take 1 capsule (40 mg total) by mouth 2 (two) times daily before a meal. 60  capsule 5  . furosemide (LASIX) 20 MG tablet Take 20 mg by mouth daily as needed for edema.     . gabapentin (NEURONTIN) 300 MG capsule Take 900 mg by mouth 4 (four) times daily.    Marland Kitchen HYDROcodone-acetaminophen (NORCO) 10-325 MG tablet Take 1-2 tablets by mouth every 6 (six) hours as needed for moderate pain or severe pain. 40 tablet 0  . levofloxacin (LEVAQUIN) 750 MG tablet Take 1 tablet (750 mg total) by mouth daily. 30 tablet 0  . Melatonin 3 MG TABS Take 3-6 mg by mouth at bedtime.     . meloxicam (MOBIC) 15 MG tablet Take 15 mg by mouth at bedtime.     Marland Kitchen morphine (MSIR) 15 MG tablet Take 1 tablet (15 mg total) by mouth every 6 (six) hours as needed for severe pain. 60 tablet 0  . Multiple Vitamin (MULTIVITAMIN WITH MINERALS) TABS tablet Take 2 tablets by mouth at bedtime.     . nebivolol (BYSTOLIC) 5 MG tablet Take 10 mg by mouth at bedtime.     . polyethylene glycol (MIRALAX / GLYCOLAX) packet Take 17 g by mouth daily as needed for moderate constipation.    . rosuvastatin (CRESTOR) 5 MG tablet Take 5 mg by mouth at bedtime.    Marland Kitchen tiZANidine (ZANAFLEX) 4 MG tablet Take 4 mg by mouth 3 (three) times daily as needed for muscle spasms.   2  . triamcinolone lotion (KENALOG) 0.1 % Apply 1 application topically as needed (dry skin).     No current facility-administered medications for this visit.   Facility-Administered Medications Ordered in Other Visits  Medication Dose Route Frequency Provider Last Rate Last Admin  . 0.9 %  sodium chloride infusion   Intravenous Once Truitt Merle, MD        PHYSICAL EXAMINATION: There were no vitals filed for this visit. There were no vitals filed for this visit.  Patient appears well over the phone. Speech is clear and non-pressured. No cough or conversational dyspnea. Mood and affect appear normal for situation   LABORATORY DATA:  No labs for today's visit    RADIOGRAPHIC STUDIES: I have personally reviewed the radiological images as listed and  agreed with the findings in the report. No results found.   ASSESSMENT & PLAN: Lindsay Little is a 53 y.o. female with   1. Lower back pain, Lumbar spine L3-4 infection  -Her CT AP from 06/18/19 shows no evidence of malignancy. She  had bone loss between L3-L4, subsequent Lumbar MRI from 06/26/19 shows evidence of chronic infection between L3-L4, suspicious of infectious discitis  -Biopsy on 07/01/19 Gram stain was negative for bacteria, and negative for malignant cells which I reviewed with her today. -She was planning IV antibiotics per Dr. Tommy Medal which were changed to oral doxycycline and levaquin due to insurance coverage. PICC was pulled.  -She continues to have severe back pain that radiates to abdomen, hips, thighs. MSIR 15 mg q6 was not enough and she started adding norco 10-325 for breakthrough after 3 hours. She continues chronic meds cymbalta and gabapentin as well -I will titrate MSIR to 30 mg q6 PRN to see if this helps her pain and mobility. Hopefully she can stop norco. I will f/u on her referral to Dr. Maryjean Ka -I recommend to continue bowel regimen to avoid opioid-induced constipation.  -she will call sooner if this is not effective pain management, will add long acting agent at that point -f/u scheduled with Dr. Burr Medico on 1/8  2.Rectal adenocarcinomaT3N2M0, Stage IIIC , ypT1N0 after neoadjuvant chemotherapy and radiation -Diagnosed in 2016. Treated with surgerywith neoadjuvantchemo and radiation. She is currently on surveillance.She has permanent colostomy bag in place.  -Her CT AP from 06/18/19 shows NED -bone biopsy on 12/18 is negative for malignant cells -continue surveillance   3. Rectal and Vaginal discharge, Urinary incontinence -She has incontinence from prior surgery.  -per Dr.Matthew, she has a high rectovaginal fistula with persistent discharge -no further surgery is recommended  -f/u withDr. Rodman Key and Dr. Johney Maine  4. Bilateral Leg muscle cramps and  neuropathy in feet -Currently on Gabapentin and Cymbalta -f/u with other providers   PLAN: -Path reviewed, negative for malignancy  -Increase MSIR to 30 mg q6 PRN  -If not effective will add long acting agent -F/u on referral to pain specialist Dr. Maryjean Ka -F/u as scheduled with Dr. Burr Medico 1/8   No problem-specific Assessment & Plan notes found for this encounter.   No orders of the defined types were placed in this encounter.  All questions were answered. The patient knows to call the clinic with any problems, questions or concerns. No barriers to learning were detected. She was encouraged to call for in-person evaluation if the current problems worsens or fails to improve with interventions discussed today.  I spent 10 minutes non-face-to-face time in today's encounter      Alla Feeling, NP 07/08/19

## 2019-07-14 ENCOUNTER — Telehealth: Payer: Self-pay | Admitting: Hematology

## 2019-07-14 ENCOUNTER — Encounter (HOSPITAL_COMMUNITY): Payer: Self-pay

## 2019-07-14 ENCOUNTER — Other Ambulatory Visit: Payer: Self-pay

## 2019-07-14 ENCOUNTER — Inpatient Hospital Stay (HOSPITAL_COMMUNITY)
Admission: EM | Admit: 2019-07-14 | Discharge: 2019-07-21 | DRG: 551 | Disposition: A | Discharge status: Home Health | Payer: 59 | Attending: Internal Medicine | Admitting: Internal Medicine

## 2019-07-14 DIAGNOSIS — Z880 Allergy status to penicillin: Secondary | ICD-10-CM

## 2019-07-14 DIAGNOSIS — C20 Malignant neoplasm of rectum: Secondary | ICD-10-CM | POA: Diagnosis present

## 2019-07-14 DIAGNOSIS — E785 Hyperlipidemia, unspecified: Secondary | ICD-10-CM | POA: Diagnosis present

## 2019-07-14 DIAGNOSIS — K219 Gastro-esophageal reflux disease without esophagitis: Secondary | ICD-10-CM | POA: Diagnosis present

## 2019-07-14 DIAGNOSIS — Z933 Colostomy status: Secondary | ICD-10-CM

## 2019-07-14 DIAGNOSIS — R5383 Other fatigue: Secondary | ICD-10-CM | POA: Diagnosis not present

## 2019-07-14 DIAGNOSIS — M4646 Discitis, unspecified, lumbar region: Principal | ICD-10-CM | POA: Diagnosis present

## 2019-07-14 DIAGNOSIS — M545 Low back pain, unspecified: Secondary | ICD-10-CM

## 2019-07-14 DIAGNOSIS — R350 Frequency of micturition: Secondary | ICD-10-CM | POA: Diagnosis present

## 2019-07-14 DIAGNOSIS — Z923 Personal history of irradiation: Secondary | ICD-10-CM

## 2019-07-14 DIAGNOSIS — E7800 Pure hypercholesterolemia, unspecified: Secondary | ICD-10-CM | POA: Diagnosis present

## 2019-07-14 DIAGNOSIS — Z888 Allergy status to other drugs, medicaments and biological substances status: Secondary | ICD-10-CM

## 2019-07-14 DIAGNOSIS — Z79891 Long term (current) use of opiate analgesic: Secondary | ICD-10-CM

## 2019-07-14 DIAGNOSIS — E559 Vitamin D deficiency, unspecified: Secondary | ICD-10-CM | POA: Diagnosis present

## 2019-07-14 DIAGNOSIS — Z885 Allergy status to narcotic agent status: Secondary | ICD-10-CM

## 2019-07-14 DIAGNOSIS — G4733 Obstructive sleep apnea (adult) (pediatric): Secondary | ICD-10-CM | POA: Diagnosis present

## 2019-07-14 DIAGNOSIS — Z87891 Personal history of nicotine dependence: Secondary | ICD-10-CM

## 2019-07-14 DIAGNOSIS — Z91048 Other nonmedicinal substance allergy status: Secondary | ICD-10-CM

## 2019-07-14 DIAGNOSIS — F419 Anxiety disorder, unspecified: Secondary | ICD-10-CM | POA: Diagnosis present

## 2019-07-14 DIAGNOSIS — Z6841 Body Mass Index (BMI) 40.0 and over, adult: Secondary | ICD-10-CM

## 2019-07-14 DIAGNOSIS — M48061 Spinal stenosis, lumbar region without neurogenic claudication: Secondary | ICD-10-CM | POA: Diagnosis present

## 2019-07-14 DIAGNOSIS — F329 Major depressive disorder, single episode, unspecified: Secondary | ICD-10-CM | POA: Diagnosis present

## 2019-07-14 DIAGNOSIS — Z882 Allergy status to sulfonamides status: Secondary | ICD-10-CM

## 2019-07-14 DIAGNOSIS — Z515 Encounter for palliative care: Secondary | ICD-10-CM | POA: Diagnosis present

## 2019-07-14 DIAGNOSIS — Z8249 Family history of ischemic heart disease and other diseases of the circulatory system: Secondary | ICD-10-CM

## 2019-07-14 DIAGNOSIS — E66813 Obesity, class 3: Secondary | ICD-10-CM | POA: Diagnosis present

## 2019-07-14 DIAGNOSIS — Z20822 Contact with and (suspected) exposure to covid-19: Secondary | ICD-10-CM | POA: Diagnosis present

## 2019-07-14 DIAGNOSIS — M4640 Discitis, unspecified, site unspecified: Secondary | ICD-10-CM | POA: Diagnosis present

## 2019-07-14 DIAGNOSIS — Z9221 Personal history of antineoplastic chemotherapy: Secondary | ICD-10-CM

## 2019-07-14 DIAGNOSIS — D638 Anemia in other chronic diseases classified elsewhere: Secondary | ICD-10-CM | POA: Diagnosis present

## 2019-07-14 DIAGNOSIS — K651 Peritoneal abscess: Secondary | ICD-10-CM | POA: Diagnosis present

## 2019-07-14 DIAGNOSIS — M4626 Osteomyelitis of vertebra, lumbar region: Secondary | ICD-10-CM | POA: Diagnosis present

## 2019-07-14 DIAGNOSIS — K589 Irritable bowel syndrome without diarrhea: Secondary | ICD-10-CM | POA: Diagnosis present

## 2019-07-14 DIAGNOSIS — N390 Urinary tract infection, site not specified: Secondary | ICD-10-CM | POA: Diagnosis present

## 2019-07-14 DIAGNOSIS — I10 Essential (primary) hypertension: Secondary | ICD-10-CM | POA: Diagnosis present

## 2019-07-14 DIAGNOSIS — G894 Chronic pain syndrome: Secondary | ICD-10-CM | POA: Diagnosis present

## 2019-07-14 DIAGNOSIS — Z79899 Other long term (current) drug therapy: Secondary | ICD-10-CM

## 2019-07-14 NOTE — Telephone Encounter (Signed)
Pt called and reported worsening back pain since last week, she has been taking oral antibiotics which were prescribed by ID Dr. Tommy Medal.  She was able to walk up to last night, but has not been able to get out of bed today, due to the severe back pain, and leg weakness.  She is able to move her legs, but feels weak, she has stool and urine incontinence (wose than before), and numbness on her feet.  She denies fever, chills, or other new symptoms.  I recommend patient to go to [facility] emergency room, for further evaluation, and likely admission. She will call EMS for transportation.  I have informed Dr. Lucianne Lei and [place] and her neurosurgeon Dr. Ronnald Ramp and asked them to see her in [facility] after admission. I will f/u with her in hospital. Of note, her recent L3-4 aspiration was negative for malignant cells and cultures were also negative.   Truitt Merle  07/14/2019

## 2019-07-14 NOTE — ED Triage Notes (Addendum)
Pt BIB [facility] for eval of back pain d/t reported "lower spine infection". Pt reports she has been bedbound since yesterday. Pt reports tingling to blt feet x 2 weeks. Pt in stretcher in triage. Reports she cannot sit up or walk. EMS admin 81m zofran and 2081m fentanyl IV

## 2019-07-15 ENCOUNTER — Emergency Department (HOSPITAL_COMMUNITY): Payer: 59

## 2019-07-15 DIAGNOSIS — M48061 Spinal stenosis, lumbar region without neurogenic claudication: Secondary | ICD-10-CM | POA: Diagnosis present

## 2019-07-15 DIAGNOSIS — Z515 Encounter for palliative care: Secondary | ICD-10-CM | POA: Diagnosis present

## 2019-07-15 DIAGNOSIS — I10 Essential (primary) hypertension: Secondary | ICD-10-CM

## 2019-07-15 DIAGNOSIS — M4646 Discitis, unspecified, lumbar region: Secondary | ICD-10-CM | POA: Diagnosis present

## 2019-07-15 DIAGNOSIS — E7800 Pure hypercholesterolemia, unspecified: Secondary | ICD-10-CM | POA: Diagnosis present

## 2019-07-15 DIAGNOSIS — G4733 Obstructive sleep apnea (adult) (pediatric): Secondary | ICD-10-CM | POA: Diagnosis present

## 2019-07-15 DIAGNOSIS — G061 Intraspinal abscess and granuloma: Secondary | ICD-10-CM | POA: Diagnosis not present

## 2019-07-15 DIAGNOSIS — K219 Gastro-esophageal reflux disease without esophagitis: Secondary | ICD-10-CM | POA: Diagnosis present

## 2019-07-15 DIAGNOSIS — Z91048 Other nonmedicinal substance allergy status: Secondary | ICD-10-CM

## 2019-07-15 DIAGNOSIS — Z87891 Personal history of nicotine dependence: Secondary | ICD-10-CM

## 2019-07-15 DIAGNOSIS — Z6841 Body Mass Index (BMI) 40.0 and over, adult: Secondary | ICD-10-CM | POA: Diagnosis not present

## 2019-07-15 DIAGNOSIS — G894 Chronic pain syndrome: Secondary | ICD-10-CM | POA: Diagnosis present

## 2019-07-15 DIAGNOSIS — Z884 Allergy status to anesthetic agent status: Secondary | ICD-10-CM

## 2019-07-15 DIAGNOSIS — D638 Anemia in other chronic diseases classified elsewhere: Secondary | ICD-10-CM | POA: Diagnosis present

## 2019-07-15 DIAGNOSIS — Z8719 Personal history of other diseases of the digestive system: Secondary | ICD-10-CM

## 2019-07-15 DIAGNOSIS — N3000 Acute cystitis without hematuria: Secondary | ICD-10-CM

## 2019-07-15 DIAGNOSIS — F419 Anxiety disorder, unspecified: Secondary | ICD-10-CM | POA: Diagnosis present

## 2019-07-15 DIAGNOSIS — K651 Peritoneal abscess: Secondary | ICD-10-CM | POA: Diagnosis present

## 2019-07-15 DIAGNOSIS — Z85048 Personal history of other malignant neoplasm of rectum, rectosigmoid junction, and anus: Secondary | ICD-10-CM | POA: Diagnosis not present

## 2019-07-15 DIAGNOSIS — Z20822 Contact with and (suspected) exposure to covid-19: Secondary | ICD-10-CM | POA: Diagnosis present

## 2019-07-15 DIAGNOSIS — E785 Hyperlipidemia, unspecified: Secondary | ICD-10-CM | POA: Diagnosis present

## 2019-07-15 DIAGNOSIS — Z880 Allergy status to penicillin: Secondary | ICD-10-CM

## 2019-07-15 DIAGNOSIS — F329 Major depressive disorder, single episode, unspecified: Secondary | ICD-10-CM | POA: Diagnosis present

## 2019-07-15 DIAGNOSIS — Z881 Allergy status to other antibiotic agents status: Secondary | ICD-10-CM

## 2019-07-15 DIAGNOSIS — K589 Irritable bowel syndrome without diarrhea: Secondary | ICD-10-CM | POA: Diagnosis present

## 2019-07-15 DIAGNOSIS — C20 Malignant neoplasm of rectum: Secondary | ICD-10-CM | POA: Diagnosis present

## 2019-07-15 DIAGNOSIS — Z9221 Personal history of antineoplastic chemotherapy: Secondary | ICD-10-CM | POA: Diagnosis not present

## 2019-07-15 DIAGNOSIS — N390 Urinary tract infection, site not specified: Secondary | ICD-10-CM | POA: Diagnosis present

## 2019-07-15 DIAGNOSIS — R350 Frequency of micturition: Secondary | ICD-10-CM | POA: Diagnosis present

## 2019-07-15 DIAGNOSIS — Z888 Allergy status to other drugs, medicaments and biological substances status: Secondary | ICD-10-CM

## 2019-07-15 DIAGNOSIS — M4640 Discitis, unspecified, site unspecified: Secondary | ICD-10-CM | POA: Diagnosis present

## 2019-07-15 DIAGNOSIS — E559 Vitamin D deficiency, unspecified: Secondary | ICD-10-CM | POA: Diagnosis present

## 2019-07-15 DIAGNOSIS — Z885 Allergy status to narcotic agent status: Secondary | ICD-10-CM

## 2019-07-15 DIAGNOSIS — M4626 Osteomyelitis of vertebra, lumbar region: Secondary | ICD-10-CM | POA: Diagnosis present

## 2019-07-15 DIAGNOSIS — Z933 Colostomy status: Secondary | ICD-10-CM | POA: Diagnosis not present

## 2019-07-15 LAB — URINALYSIS, ROUTINE W REFLEX MICROSCOPIC
Bilirubin Urine: NEGATIVE
Glucose, UA: NEGATIVE mg/dL
Ketones, ur: 5 mg/dL — AB
Nitrite: NEGATIVE
Protein, ur: NEGATIVE mg/dL
Specific Gravity, Urine: 1.009 (ref 1.005–1.030)
pH: 9 — ABNORMAL HIGH (ref 5.0–8.0)

## 2019-07-15 LAB — COMPREHENSIVE METABOLIC PANEL
ALT: 11 U/L (ref 0–44)
AST: 12 U/L — ABNORMAL LOW (ref 15–41)
Albumin: 3.4 g/dL — ABNORMAL LOW (ref 3.5–5.0)
Alkaline Phosphatase: 75 U/L (ref 38–126)
Anion gap: 12 (ref 5–15)
BUN: 6 mg/dL (ref 6–20)
CO2: 25 mmol/L (ref 22–32)
Calcium: 9.9 mg/dL (ref 8.9–10.3)
Chloride: 101 mmol/L (ref 98–111)
Creatinine, Ser: 0.52 mg/dL (ref 0.44–1.00)
GFR calc Af Amer: >60 mL/min (ref >60)
GFR calc non Af Amer: >60 mL/min (ref >60)
Glucose, Bld: 119 mg/dL — ABNORMAL HIGH (ref 70–99)
Potassium: 3.9 mmol/L (ref 3.5–5.1)
Sodium: 138 mmol/L (ref 135–145)
Total Bilirubin: 0.8 mg/dL (ref 0.3–1.2)
Total Protein: 7.1 g/dL (ref 6.5–8.1)

## 2019-07-15 LAB — CBC WITH DIFFERENTIAL/PLATELET
Abs Immature Granulocytes: 0.06 10*3/uL (ref 0.00–0.07)
Basophils Absolute: 0 10*3/uL (ref 0.0–0.1)
Basophils Relative: 0 %
Eosinophils Absolute: 0 10*3/uL (ref 0.0–0.5)
Eosinophils Relative: 0 %
HCT: 35.8 % — ABNORMAL LOW (ref 36.0–46.0)
Hemoglobin: 11.3 g/dL — ABNORMAL LOW (ref 12.0–15.0)
Immature Granulocytes: 1 %
Lymphocytes Relative: 8 %
Lymphs Abs: 0.8 10*3/uL (ref 0.7–4.0)
MCH: 27.4 pg (ref 26.0–34.0)
MCHC: 31.6 g/dL (ref 30.0–36.0)
MCV: 86.9 fL (ref 80.0–100.0)
Monocytes Absolute: 0.5 10*3/uL (ref 0.1–1.0)
Monocytes Relative: 5 %
Neutro Abs: 8.3 10*3/uL — ABNORMAL HIGH (ref 1.7–7.7)
Neutrophils Relative %: 86 %
Platelets: 313 10*3/uL (ref 150–400)
RBC: 4.12 MIL/uL (ref 3.87–5.11)
RDW: 13.6 % (ref 11.5–15.5)
WBC: 9.6 10*3/uL (ref 4.0–10.5)
nRBC: 0 % (ref 0.0–0.2)

## 2019-07-15 LAB — C-REACTIVE PROTEIN: CRP: 11.9 mg/dL — ABNORMAL HIGH (ref <1.0)

## 2019-07-15 LAB — SEDIMENTATION RATE: Sed Rate: 102 mm/hr — ABNORMAL HIGH (ref 0–22)

## 2019-07-15 LAB — SARS CORONAVIRUS 2 (TAT 6-24 HRS): SARS Coronavirus 2: NEGATIVE

## 2019-07-15 LAB — HIV ANTIBODY (ROUTINE TESTING W REFLEX): HIV Screen 4th Generation wRfx: NONREACTIVE

## 2019-07-15 MED ORDER — HYDROMORPHONE HCL 1 MG/ML IJ SOLN
1.0000 mg | Freq: Once | INTRAMUSCULAR | Status: AC
  Administered 2019-07-15: 05:00:00 1 mg via INTRAVENOUS
  Filled 2019-07-15: qty 1

## 2019-07-15 MED ORDER — DIPHENHYDRAMINE HCL 12.5 MG/5ML PO ELIX
12.5000 mg | ORAL_SOLUTION | Freq: Four times a day (QID) | ORAL | Status: DC | PRN
  Administered 2019-07-16: 12.5 mg via ORAL

## 2019-07-15 MED ORDER — SENNOSIDES-DOCUSATE SODIUM 8.6-50 MG PO TABS
1.0000 | ORAL_TABLET | Freq: Two times a day (BID) | ORAL | Status: DC
  Administered 2019-07-15 – 2019-07-21 (×12): 1 via ORAL
  Filled 2019-07-15 (×11): qty 1

## 2019-07-15 MED ORDER — VANCOMYCIN HCL 1500 MG/300ML IV SOLN: 1500.0000 mg | Freq: Once | INTRAVENOUS | Status: DC

## 2019-07-15 MED ORDER — NALOXONE HCL 0.4 MG/ML IJ SOLN: 0.4000 mg | INTRAMUSCULAR | Status: DC | PRN

## 2019-07-15 MED ORDER — TRIAMCINOLONE ACETONIDE 0.1 % EX LOTN
1.0000 "application " | TOPICAL_LOTION | CUTANEOUS | Status: DC | PRN
  Filled 2019-07-15: qty 60

## 2019-07-15 MED ORDER — ONDANSETRON HCL 4 MG/2ML IJ SOLN
4.0000 mg | Freq: Four times a day (QID) | INTRAMUSCULAR | Status: DC | PRN
  Administered 2019-07-15: 4 mg via INTRAVENOUS
  Filled 2019-07-15: qty 2

## 2019-07-15 MED ORDER — ACETAMINOPHEN 325 MG PO TABS
650.0000 mg | ORAL_TABLET | Freq: Four times a day (QID) | ORAL | Status: DC | PRN
  Administered 2019-07-15: 650 mg via ORAL
  Filled 2019-07-15: qty 2

## 2019-07-15 MED ORDER — ENOXAPARIN SODIUM 80 MG/0.8ML ~~LOC~~ SOLN
0.5000 mg/kg | SUBCUTANEOUS | Status: DC
  Administered 2019-07-15 – 2019-07-21 (×7): 75 mg via SUBCUTANEOUS
  Filled 2019-07-15 (×4): qty 0.8
  Filled 2019-07-15: qty 0.75
  Filled 2019-07-15 (×3): qty 0.8

## 2019-07-15 MED ORDER — HYDROMORPHONE HCL 1 MG/ML IJ SOLN
1.0000 mg | Freq: Once | INTRAMUSCULAR | Status: AC
  Administered 2019-07-15: 1 mg via INTRAVENOUS
  Filled 2019-07-15: qty 1

## 2019-07-15 MED ORDER — VANCOMYCIN HCL 2000 MG/400ML IV SOLN
2000.0000 mg | Freq: Three times a day (TID) | INTRAVENOUS | Status: DC
  Administered 2019-07-15 (×2): 2000 mg via INTRAVENOUS
  Filled 2019-07-15 (×3): qty 400

## 2019-07-15 MED ORDER — DIPHENHYDRAMINE HCL 50 MG/ML IJ SOLN
12.5000 mg | Freq: Four times a day (QID) | INTRAMUSCULAR | Status: DC | PRN
  Filled 2019-07-15: qty 1

## 2019-07-15 MED ORDER — HYDROMORPHONE 1 MG/ML IV SOLN
INTRAVENOUS | Status: DC
  Administered 2019-07-15 – 2019-07-19 (×3): 30 mg via INTRAVENOUS
  Administered 2019-07-19: 3.9 mg via INTRAVENOUS
  Administered 2019-07-19: 4.5 mg via INTRAVENOUS
  Administered 2019-07-19: 3.6 mg via INTRAVENOUS
  Administered 2019-07-20 (×2): 0 mg via INTRAVENOUS
  Filled 2019-07-15 (×5): qty 30

## 2019-07-15 MED ORDER — SODIUM CHLORIDE 0.9% FLUSH: 9.0000 mL | INTRAVENOUS | Status: DC | PRN

## 2019-07-15 MED ORDER — MORPHINE SULFATE 15 MG PO TABS: 30.0000 mg | ORAL_TABLET | Freq: Four times a day (QID) | ORAL | Status: DC | PRN

## 2019-07-15 MED ORDER — MELATONIN 3 MG PO TABS
6.0000 mg | ORAL_TABLET | Freq: Every day | ORAL | Status: DC
  Administered 2019-07-15 – 2019-07-20 (×6): 6 mg via ORAL
  Filled 2019-07-15 (×7): qty 2

## 2019-07-15 MED ORDER — VANCOMYCIN HCL 1500 MG/300ML IV SOLN
1500.0000 mg | Freq: Two times a day (BID) | INTRAVENOUS | Status: DC
  Administered 2019-07-16 – 2019-07-18 (×6): 1500 mg via INTRAVENOUS
  Filled 2019-07-15 (×6): qty 300

## 2019-07-15 MED ORDER — HYDROMORPHONE HCL 1 MG/ML IJ SOLN: 1.0000 mg | INTRAMUSCULAR | Status: DC | PRN

## 2019-07-15 MED ORDER — SODIUM CHLORIDE 0.9% FLUSH
3.0000 mL | Freq: Two times a day (BID) | INTRAVENOUS | Status: DC
  Administered 2019-07-15 – 2019-07-20 (×10): 3 mL via INTRAVENOUS

## 2019-07-15 MED ORDER — ENOXAPARIN SODIUM 40 MG/0.4ML ~~LOC~~ SOLN: 40.0000 mg | SUBCUTANEOUS | Status: DC

## 2019-07-15 MED ORDER — ROSUVASTATIN CALCIUM 5 MG PO TABS
5.0000 mg | ORAL_TABLET | Freq: Every day | ORAL | Status: DC
  Administered 2019-07-15 – 2019-07-20 (×6): 5 mg via ORAL
  Filled 2019-07-15 (×7): qty 1

## 2019-07-15 MED ORDER — SODIUM CHLORIDE 0.9 % IV SOLN
2.0000 g | INTRAVENOUS | Status: DC
  Administered 2019-07-15 – 2019-07-21 (×7): 2 g via INTRAVENOUS
  Filled 2019-07-15 (×2): qty 20
  Filled 2019-07-15: qty 2
  Filled 2019-07-15 (×2): qty 20
  Filled 2019-07-15: qty 2
  Filled 2019-07-15: qty 20

## 2019-07-15 MED ORDER — MORPHINE SULFATE 15 MG PO TABS
30.0000 mg | ORAL_TABLET | Freq: Four times a day (QID) | ORAL | Status: DC | PRN
  Administered 2019-07-15 – 2019-07-19 (×14): 30 mg via ORAL
  Filled 2019-07-15 (×14): qty 2

## 2019-07-15 MED ORDER — ONDANSETRON HCL 4 MG/2ML IJ SOLN
4.0000 mg | Freq: Once | INTRAMUSCULAR | Status: AC
  Administered 2019-07-15: 4 mg via INTRAVENOUS
  Filled 2019-07-15: qty 2

## 2019-07-15 MED ORDER — PANTOPRAZOLE SODIUM 40 MG PO TBEC
40.0000 mg | DELAYED_RELEASE_TABLET | Freq: Two times a day (BID) | ORAL | Status: DC
  Administered 2019-07-15 – 2019-07-21 (×13): 40 mg via ORAL
  Filled 2019-07-15 (×13): qty 1

## 2019-07-15 MED ORDER — HYDROMORPHONE HCL 1 MG/ML IJ SOLN
1.0000 mg | INTRAMUSCULAR | Status: DC | PRN
  Administered 2019-07-15 (×2): 1 mg via INTRAVENOUS
  Filled 2019-07-15 (×2): qty 1

## 2019-07-15 MED ORDER — ACETAMINOPHEN 650 MG RE SUPP: 650.0000 mg | Freq: Four times a day (QID) | RECTAL | Status: DC | PRN

## 2019-07-15 MED ORDER — DULOXETINE HCL 60 MG PO CPEP
120.0000 mg | ORAL_CAPSULE | Freq: Every day | ORAL | Status: DC
  Administered 2019-07-15 – 2019-07-20 (×6): 120 mg via ORAL
  Filled 2019-07-15 (×7): qty 2

## 2019-07-15 MED ORDER — POLYETHYLENE GLYCOL 3350 17 G PO PACK: 17.0000 g | PACK | Freq: Every day | ORAL | Status: DC | PRN

## 2019-07-15 MED ORDER — NEBIVOLOL HCL 10 MG PO TABS
10.0000 mg | ORAL_TABLET | Freq: Every day | ORAL | Status: DC
  Administered 2019-07-15 – 2019-07-20 (×6): 10 mg via ORAL
  Filled 2019-07-15 (×8): qty 1

## 2019-07-15 MED ORDER — ONDANSETRON HCL 4 MG PO TABS
4.0000 mg | ORAL_TABLET | Freq: Four times a day (QID) | ORAL | Status: DC | PRN
  Administered 2019-07-16 – 2019-07-17 (×2): 4 mg via ORAL
  Filled 2019-07-15 (×2): qty 1

## 2019-07-15 NOTE — Progress Notes (Signed)
Pharmacy Antibiotic Note  Lindsay Little is a 53 y.o. female admitted on 07/14/2019 with osteomyelitis.  Pharmacy has been consulted for Vancomycin dosing.  Vancomycin 2000 mg IV Q 8 hrs. Goal AUC 400-550. Expected AUC: 479 SCr used: 0.8   Plan: Start Vancomycin 2000 mg IV q8hr Monitor renal function, C&S and vanc levels as needed  Height: 5' 10.5" (179.1 cm) Weight: (!) 335 lb 1.6 oz (152 kg) IBW/kg (Calculated) : 69.65  Temp (24hrs), Avg:99 F (37.2 C), Min:98.6 F (37 C), Max:99.3 F (37.4 C)  Recent Labs  Lab 07/15/19 0154  WBC 9.6  CREATININE 0.52    Estimated Creatinine Clearance: 133.2 mL/min (by C-G formula based on SCr of 0.52 mg/dL).    Allergies  Allergen Reactions  . Caine-1 [Lidocaine] Swelling and Rash    Eyes swell shut; includes all caine drugs except marcaine. EMLA cream OK though (?!)  . Sulfa Antibiotics Nausea And Vomiting and Rash  . Adhesive [Tape] Other (See Comments)    Blisters - can use paper tape  . Doxycycline Nausea Only  . Flagyl [Metronidazole] Nausea Only  . Iron Nausea Only  . Oxycodone Other (See Comments)    NIGHTMARES. (tolerates hydrocodone or tramadol better)  . Penicillins Rash    Has patient had a PCN reaction causing immediate rash, facial/tongue/throat swelling, SOB or lightheadedness with hypotension: no Has patient had a PCN reaction causing severe rash involving mucus membranes or skin necrosis: no Has patient had a PCN reaction that required hospitalization no Has patient had a PCN reaction occurring within the last 10 years: no If all of the above answers are "NO", then may proceed with Cephalosporin use.     Antimicrobials this admission: Vanc 12/31 >>   Thank you for allowing pharmacy to be a part of this patient's care.  Alanda Slim, PharmD, [facility] Clinical Pharmacist Please see AMION for all Pharmacists' Contact Phone Numbers 07/15/2019, 9:04 AM

## 2019-07-15 NOTE — ED Notes (Signed)
Pt back from MRI and is complaining of pain in her back. MRI was not fully completed d/t patient complaining of intense pain. No notification to RN from MRI that the patient was in pain at MRI. Horton, MD made aware.

## 2019-07-15 NOTE — Consult Note (Signed)
Date of Admission:  07/14/2019          Reason for Consult: diskitis     Referring Provider: Dr. Tamala Julian   Assessment:  1. L3-L4 diskitis with phlegmon 2. Hx of rectal cancer sp surgery with colostomy, chemotherapy XRT followed by Dr. Burr Medico 3. Hx of intrabdominal abscess  Plan:  1. Start vancomycin and ceftriaxone 2. Consult CM re her high out of pocket cost for home health = $1k per week 3. IF pt OK to do IV antibiotics would place PICC and give 6 week course of vancomycin and ceftriaxone 4. May need repeat MRI in next 4 weeks given phlegmon may progress to abscess that might need drainage  Principal Problem:   Discitis Active Problems:   Pure hypercholesterolemia   Morbid obesity with body mass index of 40.0-49.9 (HCC)   Rectal adenocarcinoma s/p LAR resection 05/25/2015   Hypertension   Anemia of chronic disease   Chronic pain syndrome   Urinary tract infection   Scheduled Meds: . DULoxetine  120 mg Oral QHS  . enoxaparin (LOVENOX) injection  0.5 mg/kg Subcutaneous Q24H  . HYDROmorphone   Intravenous Q4H  . Melatonin  6 mg Oral QHS  . nebivolol  10 mg Oral QHS  . pantoprazole  40 mg Oral BID  . rosuvastatin  5 mg Oral QHS  . senna-docusate  1 tablet Oral BID  . sodium chloride flush  3 mL Intravenous Q12H   Continuous Infusions: . cefTRIAXone (ROCEPHIN)  IV Stopped (07/15/19 1320)  . vancomycin 20 mL/hr at 07/15/19 1514   PRN Meds:.acetaminophen **OR** acetaminophen, diphenhydrAMINE **OR** diphenhydrAMINE, morphine, naloxone **AND** sodium chloride flush, ondansetron **OR** ondansetron (ZOFRAN) IV, polyethylene glycol, triamcinolone lotion  HPI: Lindsay Little is a 53 y.o. female w hx of rectal cancer sp surgery, XRT, chemotherapy with course complicated by intraabdominal abscess recently diagnosed with Lumbar diskitis sp IR guided aspirate with cultures being unrevealing. We arranged for her to get PICC and to start IV ceftriaxone and vancomycin but her out  of pocket cost per week was $1k per week and this was felt prohibitive. Therefore IV antibiotics were abandoned and she was placed on bio-available doxycycline and levaquin. However pain has worsened and she returned to ER where MRi shows worsening diskitis and formation of phlegmon  I would recommend vancomycin and ceftriaxone. She needs CM consult. She told me that she would see if her husband and her could do something to work out cost of high out of pocket for home health  IF they decide to to do this would go with 6 weeks of this regimen. I am also open to part of regimen being IV and part being PO  I will put in OPAT consult for 6 weeks of this regimen and and reschedule her in my clinic with me. I will otherwise sign off. Please call with further questions.   Review of Systems: ROS As per HPI otherwise remainder of 12 pt ros is negative my macros is completely nonfunctional in epic at present Past Medical History:  Diagnosis Date  . Anxiety   . Chemotherapy-induced neuropathy (HCC)    toes and fingers numbness and tingling  . Colorectal delayed anastomotic leak s/p resection & colostomy 07/12/2016 11/17/2015  . Colostomy in place [facility]) 07/12/2016   due to anastomosis breakdown w/ colovaginal fistula  . Colovaginal fistula s/p omentopexy repair 07/12/2016 07/10/2016  . Complication of anesthesia   . Depression   . Family history of adverse reaction  to anesthesia    mother-- ponv  . Family history of breast cancer   . Family history of pancreatic cancer   . Family history of skin cancer   . Gastroenteritis 12/20/2015  . GERD (gastroesophageal reflux disease)   . Hiatal hernia   . History of cancer chemotherapy 02-20-2015 to 03-30-2015  . History of cardiac murmur as a child   . History of chemotherapy   . History of chronic gastritis   . History of hypertension    no issues since multiple abdminal sx's and chemo--- no medication since 12/ 2016  . History of TMJ disorder   .  Hypercholesteremia   . Hypertension   . IBS (irritable bowel syndrome)    dx age 58  . Intermittent abdominal pain    post-op multiple abdominal sx's   . Microcytic anemia   . Mild sleep apnea    per pt study 2014  very mild osa , no cpap recommended, recommeded wt loss and sleep routine  . OA (osteoarthritis)    left knee /  left shoulder  . Obesity   . PONV (postoperative nausea and vomiting)    severe" needs Scopolamine PATCH"   . Portacath in place    right chest  . Rectal adenocarcinoma ([facility]) oncologis-  dr Burr Medico-- after radiation/ chemo (ypT1, N0) --  no recurrence per last note 03/ 2018   dx 01-13-2015-- Stage IIIC (T3, N2, M0) post concurrent radiation and chemotherapy 02-20-2015 to 03-30-2015 /  05-25-2015 s/p  LAR w/ RSO (post-op complicated by late abscess and contained anatomotic leak w/ help percutaneous drainage and antibiotics)   . Rotator cuff tear, left   . S/P radiation therapy 02/20/15-03/30/15   colon/rectal  . Vitamin D deficiency   . Wears glasses   . Wears glasses     Social History   Tobacco Use  . Smoking status: Former Smoker    Packs/day: 0.25    Years: 7.00    Pack years: 1.75    Types: Cigarettes    Quit date: 07/16/1991    Years since quitting: 28.0  . Smokeless tobacco: Never Used  Substance Use Topics  . Alcohol use: No  . Drug use: Never    Family History  Problem Relation Age of Onset  . Coronary artery disease Mother 99  . Hypertension Mother   . Hyperlipidemia Mother   . Diabetes Mellitus I Mother   . Coronary artery disease Father   . Hyperlipidemia Father   . Hypertension Father   . Cancer Sister        skin - non melanoma  . Hyperlipidemia Brother   . Cancer Maternal Uncle 87       pancreatic with mets to colon and prostate  . Cirrhosis Maternal Uncle   . Hypertension Maternal Grandmother   . Diabetes Mellitus I Maternal Grandmother   . Hyperlipidemia Maternal Grandmother   . CVA Maternal Grandmother   . Hypertension  Maternal Grandfather   . Coronary artery disease Maternal Grandfather 65  . Hyperlipidemia Maternal Grandfather   . Coronary artery disease Paternal Grandmother   . Hypertension Paternal Grandmother   . Hyperlipidemia Paternal Grandmother   . Diabetes Mellitus I Paternal Grandmother   . Hypertension Paternal Grandfather   . Hyperlipidemia Paternal Grandfather   . Coronary artery disease Paternal Grandfather    Allergies  Allergen Reactions  . Caine-1 [Lidocaine] Swelling and Rash    Eyes swell shut; includes all caine drugs except marcaine. EMLA cream OK though (?!)  .  Sulfa Antibiotics Nausea And Vomiting and Rash  . Adhesive [Tape] Other (See Comments)    Blisters - can use paper tape  . Doxycycline Nausea Only  . Flagyl [Metronidazole] Nausea Only  . Iron Nausea Only  . Oxycodone Other (See Comments)    NIGHTMARES. (tolerates hydrocodone or tramadol better)  . Penicillins Rash    Has patient had a PCN reaction causing immediate rash, facial/tongue/throat swelling, SOB or lightheadedness with hypotension: no Has patient had a PCN reaction causing severe rash involving mucus membranes or skin necrosis: no Has patient had a PCN reaction that required hospitalization no Has patient had a PCN reaction occurring within the last 10 years: no If all of the above answers are "NO", then may proceed with Cephalosporin use.     OBJECTIVE: Blood pressure 132/80, pulse 74, temperature 98.3 F (36.8 C), temperature source Oral, resp. rate 18, height 5' 10.5" (1.791 m), weight (!) 152 kg, SpO2 100 %.  Physical Exam GEn Aox 3 NAD CV RRR  Pulm: no wheezes or resp distress GI: ND,  Ext no edema Neuro nonfocal Psych normal affect  Lab Results Lab Results  Component Value Date   WBC 9.6 07/15/2019   HGB 11.3 (L) 07/15/2019   HCT 35.8 (L) 07/15/2019   MCV 86.9 07/15/2019   PLT 313 07/15/2019    Lab Results  Component Value Date   CREATININE 0.52 07/15/2019   BUN 6 07/15/2019    NA 138 07/15/2019   K 3.9 07/15/2019   CL 101 07/15/2019   CO2 25 07/15/2019    Lab Results  Component Value Date   ALT 11 07/15/2019   AST 12 (L) 07/15/2019   ALKPHOS 75 07/15/2019   BILITOT 0.8 07/15/2019     Microbiology: Recent Results (from the past 240 hour(s))  Blood culture (routine x 2)     Status: None (Preliminary result)   Collection Time: 07/15/19  1:45 AM   Specimen: BLOOD  Result Value Ref Range Status   Specimen Description BLOOD RIGHT ARM  Final   Special Requests   Final    BOTTLES DRAWN AEROBIC AND ANAEROBIC Blood Culture results may not be optimal due to an excessive volume of blood received in culture bottles   Culture   Final    NO GROWTH < 12 HOURS Performed at [facility], [place] [address] [place], [place] [address]    Report Status PENDING  Incomplete  Blood culture (routine x 2)     Status: None (Preliminary result)   Collection Time: 07/15/19  1:51 AM   Specimen: BLOOD  Result Value Ref Range Status   Specimen Description BLOOD LEFT HAND  Final   Special Requests   Final    BOTTLES DRAWN AEROBIC ONLY Blood Culture adequate volume   Culture   Final    NO GROWTH < 12 HOURS Performed at [facility], [place] [address], [place], [place] [address]    Report Status PENDING  Incomplete  SARS CORONAVIRUS 2 (TAT 6-24 HRS) Nasopharyngeal Nasopharyngeal Swab     Status: None   Collection Time: 07/15/19  7:02 AM   Specimen: Nasopharyngeal Swab  Result Value Ref Range Status   SARS Coronavirus 2 NEGATIVE NEGATIVE Final    Comment: (NOTE) SARS-CoV-2 target nucleic acids are NOT DETECTED. The SARS-CoV-2 RNA is generally detectable in upper and lower respiratory specimens during the acute phase of infection. Negative results do not preclude SARS-CoV-2 infection, do not rule out co-infections with other pathogens,  and should not be used as the sole basis for treatment or other patient management decisions. Negative results must be combined with  clinical observations, patient history, and epidemiological information. The expected result is Negative. Fact Sheet for Patients: SugarRoll.be Fact Sheet for Healthcare Providers: https://www.woods-mathews.com/ This test is not yet approved or cleared by the [place] FDA and  has been authorized for detection and/or diagnosis of SARS-CoV-2 by FDA under an Emergency Use Authorization (EUA). This EUA will remain  in effect (meaning this test can be used) for the duration of the COVID-19 declaration under Section 56 4(b)(1) of the Act, 21 U.S.C. section 360bbb-3(b)(1), unless the authorization is terminated or revoked sooner. Performed at [facility], [place] [address] [place], [place] [address]     Alcide Evener, [facility] [phone] pager  07/15/2019, 6:16 PM

## 2019-07-15 NOTE — Progress Notes (Signed)
Pt unable to tolerate entire exam. Only sagittal images acquired.  No contrast was administered.

## 2019-07-15 NOTE — ED Provider Notes (Signed)
[facility] EMERGENCY DEPARTMENT Provider Note   CSN: [id number] Arrival date & time: 07/14/19  1805     History Chief Complaint  Patient presents with  . Back Pain    Lindsay Little is a 53 y.o. female.  HPI     53 year old female with a history of rectal adenocarcinoma status post colostomy and chemotherapy, recent history of discitis currently on doxycycline and Levaquin who presents with worsening back pain and immobility.  Patient reports over the last 24 hours she has had worsening back pain that now radiates into her bilateral lower extremities.  She states that she has been in bed for the last 24 hours.  She reports generalized weakness in the bilateral lower extremities.  She rates her pain at 8 out of 10.  She has taken her home morphine and breakthrough pain medication with minimal relief.  She has been able to urinate on her own but states that she cannot get up to go to the bathroom so has been urinating on herself.  She denies any fevers.  She states that her pain is different and worse than her baseline.  She states normally her pain stays in her back.  Patient with an MRI of the lumbar spine on December 12.  She has been treated by infectious disease, Dr. Tommy Medal, neurosurgery, Dr. Ronnald Ramp, and Dr. Burr Medico.  Past Medical History:  Diagnosis Date  . Anxiety   . Chemotherapy-induced neuropathy (HCC)    toes and fingers numbness and tingling  . Colorectal delayed anastomotic leak s/p resection & colostomy 07/12/2016 11/17/2015  . Colostomy in place [facility]) 07/12/2016   due to anastomosis breakdown w/ colovaginal fistula  . Colovaginal fistula s/p omentopexy repair 07/12/2016 07/10/2016  . Complication of anesthesia   . Depression   . Family history of adverse reaction to anesthesia    mother-- ponv  . Family history of breast cancer   . Family history of pancreatic cancer   . Family history of skin cancer   . Gastroenteritis 12/20/2015  . GERD  (gastroesophageal reflux disease)   . Hiatal hernia   . History of cancer chemotherapy 02-20-2015 to 03-30-2015  . History of cardiac murmur as a child   . History of chemotherapy   . History of chronic gastritis   . History of hypertension    no issues since multiple abdminal sx's and chemo--- no medication since 12/ 2016  . History of TMJ disorder   . Hypercholesteremia   . Hypertension   . IBS (irritable bowel syndrome)    dx age 31  . Intermittent abdominal pain    post-op multiple abdominal sx's   . Microcytic anemia   . Mild sleep apnea    per pt study 2014  very mild osa , no cpap recommended, recommeded wt loss and sleep routine  . OA (osteoarthritis)    left knee /  left shoulder  . Obesity   . PONV (postoperative nausea and vomiting)    severe" needs Scopolamine PATCH"   . Portacath in place    right chest  . Rectal adenocarcinoma ([place]) oncologis-  dr Burr Medico-- after radiation/ chemo (ypT1, N0) --  no recurrence per last note 03/ 2018   dx 01-13-2015-- Stage IIIC (T3, N2, M0) post concurrent radiation and chemotherapy 02-20-2015 to 03-30-2015 /  05-25-2015 s/p  LAR w/ RSO (post-op complicated by late abscess and contained anatomotic leak w/ help percutaneous drainage and antibiotics)   . Rotator cuff tear, left   .  S/P radiation therapy 02/20/15-03/30/15   colon/rectal  . Vitamin D deficiency   . Wears glasses   . Wears glasses     Patient Active Problem List   Diagnosis Date Noted  . Chronic pain syndrome 07/24/2018  . Parastomal hernia s/p lap repair w mesh 07/23/2018 07/23/2018  . 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  . 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  . Seroma of musculoskeletal structure after non-musculoskeletal system procedure 04/24/2018  . S/P lumbar spinal fusion 02/12/2018  . Genetic testing 01/12/2018  . Family history of pancreatic cancer   . Family history of skin cancer   . Family history of breast cancer   . Colostomy in place [facility]) 07/12/2016  .  Rectocutaneous fistula 07/10/2016  . Hydroureter on right 12/20/2015  . Abdominal pain 12/19/2015  . Hypokalemia 11/23/2015  . Pelvic abscess s/p drainage & omental pedicle flap 11/17/2015 06/23/2015  . Anemia of chronic disease 06/14/2015  . Hypertension   . IBS (irritable bowel syndrome)   . Depression   . Cavernous hemangioma of liver - segments 5 & 7 02/07/2015  . Osteoarthritis of left knee 02/07/2015  . Crohn's disease ([place]) 02/07/2015  . Rectal adenocarcinoma s/p LAR resection 05/25/2015 01/31/2015  . Pure hypercholesterolemia 02/21/2014  . Morbid obesity with body mass index of 40.0-49.9 ([place]) 02/21/2014  . Family history of ischemic heart disease 02/21/2014    Past Surgical History:  Procedure Laterality Date  . ABDOMINAL HYSTERECTOMY  1996   uterus and cervix  . BACK SURGERY  02/12/2018   lumbar surgery  . COLON RESECTION N/A 07/12/2016   Procedure: LAPAROSCOPIC LYSIS OF ADHESIONS, OMENTOPEXY, HAND ASSISTED RESECTION OF  COLON, END TO END ANASTOMOSIS, COLOSTOMY;  Surgeon: Michael Boston, MD;  Location: [facility];  Service: General;  Laterality: N/A;  . COMBINED HYSTEROSCOPY DIAGNOSTIC / D&C  x2 1990's  . DIAGNOSTIC LAPAROSCOPY  age 75 and age 15  . EUS N/A 01/18/2015   Procedure: LOWER ENDOSCOPIC ULTRASOUND (EUS);  Surgeon: Arta Silence, MD;  Location: [facility];  Service: Endoscopy;  Laterality: N/A;  . EXCISION OF SKIN TAG  11/17/2015   Procedure: EXCISION OF SKIN TAG;  Surgeon: Michael Boston, MD;  Location: [facility];  Service: General;;  . ILEO LOOP COLOSTOMY CLOSURE N/A 11/17/2015   Procedure: LAPAROSCOPIC DIVERTING LOOP ILEOSTOMY  DRAINAGE OF PELVIC ABSCESS;  Surgeon: Michael Boston, MD;  Location: [facility];  Service: General;  Laterality: N/A;  . ILEOSTOMY CLOSURE N/A 05/31/2016   Procedure: TAKEDOWN LOOP ILEOSTOMY;  Surgeon: Michael Boston, MD;  [facility]: [facility];  Service: General;  Laterality: N/A;  . IMPACTION REMOVAL  11/17/2015   Procedure: DISIMPACTION REMOVAL;  Surgeon:  Michael Boston, MD;  Location: [facility];  Service: General;;  . INSERTION OF MESH N/A 07/23/2018   Procedure: INSERTION OF MESH X2;  Surgeon: Michael Boston, MD;  Location: [facility];  Service: General;  Laterality: N/A;  . IR LUMBAR [place] W/IMG GUIDE  07/01/2019  . KNEE ARTHROSCOPY Left 1990's  . KNEE ARTHROSCOPY W/ MENISCECTOMY Left 09/14/2009   and chondroplasty debridement  . LAPAROSCOPIC LYSIS OF ADHESIONS  11/17/2015   Procedure: LAPAROSCOPIC LYSIS OF ADHESIONS;  Surgeon: Michael Boston, MD;  Location: [facility];  Service: General;;  . LUMBAR WOUND DEBRIDEMENT N/A 04/24/2018   Procedure: wound exploration, irrigation and debridement;  Surgeon: Eustace Moore, MD;  Location: [facility];  Service: Neurosurgery;  Laterality: N/A;  wound exploration, irrigation and debridement  . LYSIS OF ADHESION N/A 07/23/2018   Procedure: LYSIS OF ADHESIONS;  Surgeon: Johney Maine,  Remo Lipps, MD;  Location: [facility];  Service: General;  Laterality: N/A;  . PORT-A-CATH REMOVAL N/A 11/21/2016   Procedure: REMOVAL PORT-A-CATH;  Surgeon: Michael Boston, MD;  Location[facility];  Service: General;  Laterality: N/A;  . PORTACATH PLACEMENT N/A 05/25/2015   Procedure: INSERTION PORT-A-CATH;  Surgeon: Michael Boston, MD;  Location[facility];  Service: General;  Laterality: N/A;-remains inplace Right chest.  . ROTATOR CUFF REPAIR Right 2006  . TONSILLECTOMY  age 44  . VENTRAL HERNIA REPAIR N/A 07/23/2018   Procedure: LAPAROSCOPIC VENTRAL WALL HERNIA REPAIR;  Surgeon: Michael Boston, MD;  [facility];  Service: General;  Laterality: N/A;  . XI ROBOTIC ASSISTED LOWER ANTERIOR RESECTION N/A 05/25/2015   Procedure: XI ROBOTIC ASSISTED LOWER ANTERIOR RESECTION, , RIGID PROCTOSCOPY, RIGHT OOPHORECTOMY;  Surgeon: Michael Boston, MD;  [facility];  Service: General;  Laterality: N/A;     OB History   No obstetric history on file.     Family History  Problem Relation Age of Onset  . Coronary artery disease Mother 22  .  Hypertension Mother   . Hyperlipidemia Mother   . Diabetes Mellitus I Mother   . Coronary artery disease Father   . Hyperlipidemia Father   . Hypertension Father   . Cancer Sister        skin - non melanoma  . Hyperlipidemia Brother   . Cancer Maternal Uncle 18       pancreatic with mets to colon and prostate  . Cirrhosis Maternal Uncle   . Hypertension Maternal Grandmother   . Diabetes Mellitus I Maternal Grandmother   . Hyperlipidemia Maternal Grandmother   . CVA Maternal Grandmother   . Hypertension Maternal Grandfather   . Coronary artery disease Maternal Grandfather 65  . Hyperlipidemia Maternal Grandfather   . Coronary artery disease Paternal Grandmother   . Hypertension Paternal Grandmother   . Hyperlipidemia Paternal Grandmother   . Diabetes Mellitus I Paternal Grandmother   . Hypertension Paternal Grandfather   . Hyperlipidemia Paternal Grandfather   . Coronary artery disease Paternal Grandfather     Social History   Tobacco Use  . Smoking status: Former Smoker    Packs/day: 0.25    Years: 7.00    Pack years: 1.75    Types: Cigarettes    Quit date: 07/16/1991    Years since quitting: 28.0  . Smokeless tobacco: Never Used  Substance Use Topics  . Alcohol use: No  . Drug use: Never    Home Medications Prior to Admission medications   Medication Sig Start Date End Date Taking? Authorizing Provider  Cholecalciferol (VITAMIN D3) 5000 units CAPS Take 5,000 Units by mouth at bedtime.     [provider]  Coenzyme Q10 (COQ10) 200 MG CAPS Take 200 mg by mouth at bedtime.    [provider]  doxycycline (VIBRA-TABS) 100 MG tablet Take 1 tablet (100 mg total) by mouth 2 (two) times daily. 07/02/19   Golden Circle, FNP  DULoxetine (CYMBALTA) 60 MG capsule Take 120 mg by mouth at bedtime.     [provider]  esomeprazole (NEXIUM) 40 MG capsule Take 1 capsule (40 mg total) by mouth 2 (two) times daily before a meal. 12/22/15   Michael Boston,  MD  furosemide (LASIX) 20 MG tablet Take 20 mg by mouth daily as needed for edema.     [provider]  gabapentin (NEURONTIN) 300 MG capsule Take 900 mg by mouth 4 (four) times daily.  [provider]  HYDROcodone-acetaminophen (NORCO) 10-325 MG tablet Take 1-2 tablets by mouth every 6 (six) hours as needed for moderate pain or severe pain. 07/26/18   Michael Boston, MD  levofloxacin (LEVAQUIN) 750 MG tablet Take 1 tablet (750 mg total) by mouth daily. 07/02/19 08/01/19  Golden Circle, FNP  Melatonin 3 MG TABS Take 3-6 mg by mouth at bedtime.     [provider]  meloxicam (MOBIC) 15 MG tablet Take 15 mg by mouth at bedtime.     [provider]  morphine (MSIR) 30 MG tablet Take 1 tablet (30 mg total) by mouth every 6 (six) hours as needed for severe pain. 07/08/19   Alla Feeling, NP  Multiple Vitamin (MULTIVITAMIN WITH MINERALS) TABS tablet Take 2 tablets by mouth at bedtime.     [provider]  nebivolol (BYSTOLIC) 5 MG tablet Take 10 mg by mouth at bedtime.     [provider]  polyethylene glycol (MIRALAX / GLYCOLAX) packet Take 17 g by mouth daily as needed for moderate constipation.    [provider]  rosuvastatin (CRESTOR) 5 MG tablet Take 5 mg by mouth at bedtime.    [provider]  tiZANidine (ZANAFLEX) 4 MG tablet Take 4 mg by mouth 3 (three) times daily as needed for muscle spasms.  05/28/18   [provider]  triamcinolone lotion (KENALOG) 0.1 % Apply 1 application topically as needed (dry skin).    [provider]    Allergies    Caine-1 [lidocaine], Sulfa antibiotics, Adhesive [tape], Doxycycline, Flagyl [metronidazole], Iron, Oxycodone, and Penicillins  Review of Systems   Review of Systems  Constitutional: Negative for fever.  Respiratory: Negative for shortness of breath.   Cardiovascular: Negative for chest pain.  Gastrointestinal: Negative for abdominal pain.  Genitourinary:  Negative for difficulty urinating.  Musculoskeletal: Positive for back pain.  Neurological: Positive for weakness and numbness.  All other systems reviewed and are negative.   Physical Exam Updated Vital Signs BP 128/68   Pulse 81   Temp 98.6 F (37 C) (Oral)   Resp 16   Ht 1.791 m (5' 10.5")   Wt (!) 152 kg   SpO2 94%   BMI 47.40 kg/m   Physical Exam Vitals and nursing note reviewed.  Constitutional:      Appearance: She is well-developed.     Comments: Morbidly obese  HENT:     Head: Normocephalic and atraumatic.  Eyes:     Pupils: Pupils are equal, round, and reactive to light.  Cardiovascular:     Rate and Rhythm: Normal rate and regular rhythm.     Heart sounds: Normal heart sounds.  Pulmonary:     Effort: Pulmonary effort is normal. No respiratory distress.     Breath sounds: No wheezing.  Abdominal:     General: Bowel sounds are normal.     Palpations: Abdomen is soft.     Tenderness: There is no abdominal tenderness.     Comments: Colostomy in place  Musculoskeletal:     Cervical back: Neck supple.     Right lower leg: No edema.     Left lower leg: No edema.  Skin:    General: Skin is warm and dry.  Neurological:     Mental Status: She is alert and oriented to person, place, and time.     Comments: 4+ out of 5 bilateral dorsi and plantarflexion of feet, difficult to assess proximal strength secondary to pain, no clonus,  difficult to elicit patellar reflexes secondary to habitus  Psychiatric:        Mood and Affect: Mood normal.     ED Results / Procedures / Treatments   Labs (all labs ordered are listed, but only abnormal results are displayed) Labs Reviewed  CBC WITH DIFFERENTIAL/PLATELET - Abnormal; Notable for the following components:      Result Value   Hemoglobin 11.3 (*)    HCT 35.8 (*)    Neutro Abs 8.3 (*)    All other components within normal limits  COMPREHENSIVE METABOLIC PANEL - Abnormal; Notable for the following components:    Glucose, Bld 119 (*)    Albumin 3.4 (*)    AST 12 (*)    All other components within normal limits  URINALYSIS, ROUTINE W REFLEX MICROSCOPIC - Abnormal; Notable for the following components:   APPearance HAZY (*)    pH 9.0 (*)    Hgb urine dipstick SMALL (*)    Ketones, ur 5 (*)    Leukocytes,Ua MODERATE (*)    Bacteria, UA RARE (*)    All other components within normal limits  SEDIMENTATION RATE - Abnormal; Notable for the following components:   Sed Rate 102 (*)    All other components within normal limits  C-REACTIVE PROTEIN - Abnormal; Notable for the following components:   CRP 11.9 (*)    All other components within normal limits  CULTURE, BLOOD (ROUTINE X 2)  CULTURE, BLOOD (ROUTINE X 2)  SARS CORONAVIRUS 2 (TAT 6-24 HRS)    EKG None  Radiology MR LUMBAR SPINE WO CONTRAST  Result Date: 07/15/2019 CLINICAL DATA:  Low back pain with progressive neurologic deficit EXAM: MRI LUMBAR SPINE WITHOUT CONTRAST TECHNIQUE: Multiplanar, multisequence MR imaging of the lumbar spine was performed. No intravenous contrast was administered. COMPARISON:  06/26/2019 FINDINGS: Truncated study due to patient pain. Only sagittal imaging was acquired, without contrast. Redemonstrated changes of discitis osteomyelitis at L3-4 with disc space purulence and endplate destruction. The disc space appears taller and more T2 hyperintense. There is paraspinal inflammation extending into the bilateral psoas which is progressed based on sagittal STIR imaging. No evidence of dorsal epidural abscess. T2 hyperintense material ventral to the L4 body follows fat signal and is unchanged. There is L3-4 high-grade spinal stenosis from degenerative posterior element hypertrophy, disc bulging, and epidural fat expansion. Pre-existing L4-5 PLIF without evident solid fusion by recent abdominal CT IMPRESSION: 1. Truncated noncontrast study due to patient pain. 2. L3-4 discitis osteomyelitis with progressive paravertebral  inflammation/phlegmon compared to 06/26/2019. Intervertebral widening and purulence has likely also progressed. 3. High-grade thecal sac narrowing at L3-4/L4 attributed to degenerative changes and epidural fat expansion. No change in epidural soft tissues to imply interval abscess. Electronically Signed   By: Monte Fantasia M.D.   On: 07/15/2019 06:54    Procedures Procedures (including critical care time)  Medications Ordered in ED Medications  HYDROmorphone (DILAUDID) injection 1 mg (1 mg Intravenous Given 07/15/19 0235)  HYDROmorphone (DILAUDID) injection 1 mg (1 mg Intravenous Given 07/15/19 0455)  HYDROmorphone (DILAUDID) injection 1 mg (1 mg Intravenous Given 07/15/19 0652)  ondansetron (ZOFRAN) injection 4 mg (4 mg Intravenous Given 07/15/19 4709)    ED Course  I have reviewed the triage vital signs and the nursing notes.  Pertinent labs & imaging results that were available during my care of the patient were reviewed by me and considered in my medical decision making (see chart for details).  Clinical Course as of Jul 14 658  Thu Jul 15, 2019  3709 Patient reportedly did not tolerate MRI.  It appears the axials were not obtained and contrast was not given.   [CH]  [phone] Discussed the patient with neurosurgery, [place], PA.  She is a primary patient of Dr. Ronnald Ramp.  No signs or symptoms of cauda equina at this time.  They will consult.  Given ongoing pain and inability to ambulate secondary to pain, will admit for further work-up and evaluation.   [CH]  [phone] Patient refuses full back and posterior exam secondary to pain.   [CH]    Clinical Course User Index [CH] Dezire Turk, Barbette Hair, MD   MDM Rules/Calculators/A&P                       Patient presents with worsening pain.  Recent history of discitis and on outpatient antibiotics.  Pain is fairly atypical and bilateral in nature.  She is morbidly obese and difficult to examine but does not appear to have any signs or symptoms  of cauda equina at this time.  Mostly it is her pain.  Lab work obtained.  No significant leukocytosis.  CRP improved but sed rate increased.  Unfortunately, patient was unable to fully tolerate her MRI.  She only got a few images before the MRI was aborted secondary to pain.  Those images are pending.  I did discuss the patient with neurosurgery.  Someone from Dr. Adah Salvage team will consult.  She is likely also needs infectious disease consultation.  Given inability to ambulate and ongoing pain issues, will admit to the hospitalist.    Final Clinical Impression(s) / ED Diagnoses Final diagnoses:  Discitis of lumbar region  Acute midline low back pain without sciatica    Rx / DC Orders ED Discharge Orders    None       Merryl Hacker, MD 07/15/19 [phone]

## 2019-07-15 NOTE — ED Notes (Signed)
Pt refused MRI at this time. Will plan for transport in 15-20mins.

## 2019-07-15 NOTE — Progress Notes (Signed)
      INFECTIOUS DISEASE ATTENDING ADDENDUM:   Date: 07/15/2019  Patient name: Lindsay Little  Medical record number: [id number]  Date of birth: 12/19/1965   Diagnosis: Diskitis  Culture Result: [place]  Allergies  Allergen Reactions  . Caine-1 [Lidocaine] Swelling and Rash    Eyes swell shut; includes all caine drugs except marcaine. EMLA cream OK though (?!)  . Sulfa Antibiotics Nausea And Vomiting and Rash  . Adhesive [Tape] Other (See Comments)    Blisters - can use paper tape  . Doxycycline Nausea Only  . Flagyl [Metronidazole] Nausea Only  . Iron Nausea Only  . Oxycodone Other (See Comments)    NIGHTMARES. (tolerates hydrocodone or tramadol better)  . Penicillins Rash    Has patient had a PCN reaction causing immediate rash, facial/tongue/throat swelling, SOB or lightheadedness with hypotension: no Has patient had a PCN reaction causing severe rash involving mucus membranes or skin necrosis: no Has patient had a PCN reaction that required hospitalization no Has patient had a PCN reaction occurring within the last 10 years: no If all of the above answers are "NO", then may proceed with Cephalosporin use.     OPAT Orders Discharge antibiotics:    Ceftriaxone 2 grams daily and   Vancomycin per pharmacy protocol  Aim for Vancomycin trough 15-20 (unless otherwise indicated)   Duration:  6 weeks  End Date:  See OPAT orders  [facility] Care Per Protocol:  Labs BI- weekly while on IV antibiotics:  _x_ BMP w GFR _x_ Vancomycin trough  Labs  weekly while on IV antibiotics: _x_ CBC with differential  _x_ CRP _x_ ESR     _x_ Please pull PIC at completion of IV antibiotics __ Please leave PIC in place until doctor has seen patient or been notified  Fax weekly labs to [phone]   PRINCE OLIVIER has an appointment on January 19th at [facility] is located in the [facility] at  [address] in [place].  [address], which is located to the left of the elevators.  Phone: [phone]  Fax: [phone]  [email or web address]  Sbe should arrive 15 minutes prior to her appt.   @    [place]ina [place] 07/15/2019, 6:53 PM

## 2019-07-15 NOTE — Progress Notes (Signed)
Pharmacy Antibiotic Note  Lindsay Little is a 53 y.o. female admitted on 07/14/2019 with osteomyelitis.  Pharmacy has been consulted for Vancomycin dosing.  Given the patient's weight, renal function, and estimated AUC - will reduce the Vancomycin dose this evening.  Plan: - Reduce Vancomycin to 1500 mg IV every 12 hours (est AUC 496, SCr 0.8, Vd 0.5) - Continue Rocephin 2g IV every 24 hours per MD - Will continue to follow renal function, culture results, LOT, and antibiotic de-escalation plans   Height: 5' 10.5" (179.1 cm) Weight: (!) 335 lb 1.6 oz (152 kg) IBW/kg (Calculated) : 69.65  Temp (24hrs), Avg:98.5 F (36.9 C), Min:98.3 F (36.8 C), Max:98.6 F (37 C)  Recent Labs  Lab 07/15/19 0154  WBC 9.6  CREATININE 0.52    Estimated Creatinine Clearance: 133.2 mL/min (by C-G formula based on SCr of 0.52 mg/dL).    Allergies  Allergen Reactions  . Caine-1 [Lidocaine] Swelling and Rash    Eyes swell shut; includes all caine drugs except marcaine. EMLA cream OK though (?!)  . Sulfa Antibiotics Nausea And Vomiting and Rash  . Adhesive [Tape] Other (See Comments)    Blisters - can use paper tape  . Doxycycline Nausea Only  . Flagyl [Metronidazole] Nausea Only  . Iron Nausea Only  . Oxycodone Other (See Comments)    NIGHTMARES. (tolerates hydrocodone or tramadol better)  . Penicillins Rash    Has patient had a PCN reaction causing immediate rash, facial/tongue/throat swelling, SOB or lightheadedness with hypotension: no Has patient had a PCN reaction causing severe rash involving mucus membranes or skin necrosis: no Has patient had a PCN reaction that required hospitalization no Has patient had a PCN reaction occurring within the last 10 years: no If all of the above answers are "NO", then may proceed with Cephalosporin use.     Antimicrobials this admission: Vanc 12/31 >>  Rocephin 12/31 >>  Thank you for allowing pharmacy to be a part of this patient's  care.  Alycia Rossetti, PharmD, BCPS Clinical Pharmacist Clinical phone for 07/15/2019: [phone] 07/15/2019 7:30 PM   **Pharmacist phone directory can now be found on [email or web address] ([email or web address]).  Listed under [place].

## 2019-07-15 NOTE — ED Notes (Signed)
Lunch Tray Ordered @ 1108.

## 2019-07-15 NOTE — H&P (Signed)
History and Physical    Lindsay Little TIR:[id number] DOB: 01-15-1966 DOA: 07/14/2019  Referring MD/NP/PA: Thayer Jew, MD PCP: [name], MD  Patient coming from: Home Via EMS  Chief Complaint: Back pain  I have personally briefly reviewed patient's old medical records in [place]   HPI: Lindsay Little is a 53 y.o. female with medical history significant of discitis, rectal adenocarcinoma s/p radiation, s/p colostomy, chronic pain She presents with worsening back pain over the last 2 days.  Symptoms initially started back in October.  However, it reportedly went away and subsequently came back.  Dr. Burr Medico of oncology had been directing her care after CT of the abdomen and pelvis from 12/4 showed no evidence of malignancy, but did note bone loss between L3-4.  MRI on 12/12 showed evidence of chronic infection between L3-4 concerning for discitis. Patient underwent IR guided aspiration of the L3-4 x2 on 12/17 by Dr. Estanislado Pandy, but cultures did not show any growth. The plan was patient be set up with IV antibiotics at home and she had a PICC line placed, but it would have cost her $1000 per week to receive IV antibiotics at home.  On 12/18, she was prescribed doxycycline 100 mg twice daily and Levaquin 750 mg daily instead.  Patient has been taking those medicines as prescribed, but her last 2 days has had worsening pain to the point where she was unable to get out of bed.  Pain is described as sharp and we will radiate down from her back all the way to her toes on both legs.  Pain worsened with any any kind of movement and laying flat helps relieve pain symptoms some.  Her pain medications morphine sulfate and hydrocodone have no longer been helping.  She has had urinary frequency and sometimes not been able to make it to the bathroom due to pain.  Denies having any significant fever, chills, nausea, vomiting, diarrhea    ED Course: Upon admission into the emergency department  patient was noted to have O2 saturations as low as 86% loss leaving for which 2 L nasal cannula oxygen were placed.  All other vital signs were within normal limits.  Labs significant for CRP 11.9 and sed rate 102.  Urinalysis was positive for moderate leukocytes, rare bacteria, and 11-20 WBCs.  MRI of the lumbar spine was limited, but did note L3-4 discitis osteomyelitis with progressive perivertebral inflammation/phlegmon and high-grade thecal sac narrowing at L3-4.  Neurosurgery was consulted.  Patient had been given a total of 3 mg of Dilaudid IV and Zofran without complete resolution of pain.  TRH called to admit for pain control.  Review of Systems  Constitutional: Negative for chills and fever.  Eyes: Negative for photophobia and pain.  Cardiovascular: Negative for chest pain and claudication.  Genitourinary: Positive for frequency.  Musculoskeletal: Positive for back pain.  Otherwise a complete 10 point review of systems was performed and negative except for as noted here or above in HPI.  Past Medical History:  Diagnosis Date  . Anxiety   . Chemotherapy-induced neuropathy (HCC)    toes and fingers numbness and tingling  . Colorectal delayed anastomotic leak s/p resection & colostomy 07/12/2016 11/17/2015  . Colostomy in place [facility]) 07/12/2016   due to anastomosis breakdown w/ colovaginal fistula  . Colovaginal fistula s/p omentopexy repair 07/12/2016 07/10/2016  . Complication of anesthesia   . Depression   . Family history of adverse reaction to anesthesia    mother-- ponv  .  Family history of breast cancer   . Family history of pancreatic cancer   . Family history of skin cancer   . Gastroenteritis 12/20/2015  . GERD (gastroesophageal reflux disease)   . Hiatal hernia   . History of cancer chemotherapy 02-20-2015 to 03-30-2015  . History of cardiac murmur as a child   . History of chemotherapy   . History of chronic gastritis   . History of hypertension    no issues since  multiple abdminal sx's and chemo--- no medication since 12/ 2016  . History of TMJ disorder   . Hypercholesteremia   . Hypertension   . IBS (irritable bowel syndrome)    dx age 55  . Intermittent abdominal pain    post-op multiple abdominal sx's   . Microcytic anemia   . Mild sleep apnea    per pt study 2014  very mild osa , no cpap recommended, recommeded wt loss and sleep routine  . OA (osteoarthritis)    left knee /  left shoulder  . Obesity   . PONV (postoperative nausea and vomiting)    severe" needs Scopolamine PATCH"   . Portacath in place    right chest  . Rectal adenocarcinoma ([place]) oncologis-  dr Burr Medico-- after radiation/ chemo (ypT1, N0) --  no recurrence per last note 03/ 2018   dx 01-13-2015-- Stage IIIC (T3, N2, M0) post concurrent radiation and chemotherapy 02-20-2015 to 03-30-2015 /  05-25-2015 s/p  LAR w/ RSO (post-op complicated by late abscess and contained anatomotic leak w/ help percutaneous drainage and antibiotics)   . Rotator cuff tear, left   . S/P radiation therapy 02/20/15-03/30/15   colon/rectal  . Vitamin D deficiency   . Wears glasses   . Wears glasses     Past Surgical History:  Procedure Laterality Date  . ABDOMINAL HYSTERECTOMY  1996   uterus and cervix  . BACK SURGERY  02/12/2018   lumbar surgery  . COLON RESECTION N/A 07/12/2016   Procedure: LAPAROSCOPIC LYSIS OF ADHESIONS, OMENTOPEXY, HAND ASSISTED RESECTION OF  COLON, END TO END ANASTOMOSIS, COLOSTOMY;  Surgeon: Michael Boston, MD;  Location: [facility];  Service: General;  Laterality: N/A;  . COMBINED HYSTEROSCOPY DIAGNOSTIC / D&C  x2 1990's  . DIAGNOSTIC LAPAROSCOPY  age 45 and age 57  . EUS N/A 01/18/2015   Procedure: LOWER ENDOSCOPIC ULTRASOUND (EUS);  Surgeon: Arta Silence, MD;  Location: [facility];  Service: Endoscopy;  Laterality: N/A;  . EXCISION OF SKIN TAG  11/17/2015   Procedure: EXCISION OF SKIN TAG;  Surgeon: Michael Boston, MD;  Location: [facility];  Service: General;;  . ILEO LOOP  COLOSTOMY CLOSURE N/A 11/17/2015   Procedure: LAPAROSCOPIC DIVERTING LOOP ILEOSTOMY  DRAINAGE OF PELVIC ABSCESS;  Surgeon: Michael Boston, MD;  Location: [facility];  Service: General;  Laterality: N/A;  . ILEOSTOMY CLOSURE N/A 05/31/2016   Procedure: TAKEDOWN LOOP ILEOSTOMY;  Surgeon: Michael Boston, MD;  Location: [facility];  Service: General;  Laterality: N/A;  . IMPACTION REMOVAL  11/17/2015   Procedure: DISIMPACTION REMOVAL;  Surgeon: Michael Boston, MD;  Location: [facility];  Service: General;;  . INSERTION OF MESH N/A 07/23/2018   Procedure: INSERTION OF MESH X2;  Surgeon: Michael Boston, MD;  Location: [facility];  Service: General;  Laterality: N/A;  . IR LUMBAR [place] W/IMG GUIDE  07/01/2019  . KNEE ARTHROSCOPY Left 1990's  . KNEE ARTHROSCOPY W/ MENISCECTOMY Left 09/14/2009   and chondroplasty debridement  . LAPAROSCOPIC LYSIS OF ADHESIONS  11/17/2015  Procedure: LAPAROSCOPIC LYSIS OF ADHESIONS;  Surgeon: Michael Boston, MD;  Location: [facility];  Service: General;;  . LUMBAR WOUND DEBRIDEMENT N/A 04/24/2018   Procedure: wound exploration, irrigation and debridement;  Surgeon: Eustace Moore, MD;  Location: [facility];  Service: Neurosurgery;  Laterality: N/A;  wound exploration, irrigation and debridement  . LYSIS OF ADHESION N/A 07/23/2018   Procedure: LYSIS OF ADHESIONS;  Surgeon: Michael Boston, MD;  Location: [facility];  Service: General;  Laterality: N/A;  . PORT-A-CATH REMOVAL N/A 11/21/2016   Procedure: REMOVAL PORT-A-CATH;  Surgeon: Michael Boston, MD;  Location: [facility];  Service: General;  Laterality: N/A;  . PORTACATH PLACEMENT N/A 05/25/2015   Procedure: INSERTION PORT-A-CATH;  Surgeon: Michael Boston, MD;  Location: [facility];  Service: General;  Laterality: N/A;-remains inplace Right chest.  . ROTATOR CUFF REPAIR Right 2006  . TONSILLECTOMY  age 67  . VENTRAL HERNIA REPAIR N/A 07/23/2018   Procedure: LAPAROSCOPIC VENTRAL WALL HERNIA REPAIR;  Surgeon: Michael Boston, MD;  Location: [facility];   Service: General;  Laterality: N/A;  . XI ROBOTIC ASSISTED LOWER ANTERIOR RESECTION N/A 05/25/2015   Procedure: XI ROBOTIC ASSISTED LOWER ANTERIOR RESECTION, , RIGID PROCTOSCOPY, RIGHT OOPHORECTOMY;  Surgeon: Michael Boston, MD;  Location: [facility];  Service: General;  Laterality: N/A;     reports that she quit smoking about 28 years ago. Her smoking use included cigarettes. She has a 1.75 pack-year smoking history. She has never used smokeless tobacco. She reports that she does not drink alcohol or use drugs.  Allergies  Allergen Reactions  . Caine-1 [Lidocaine] Swelling and Rash    Eyes swell shut; includes all caine drugs except marcaine. EMLA cream OK though (?!)  . Sulfa Antibiotics Nausea And Vomiting and Rash  . Adhesive [Tape] Other (See Comments)    Blisters - can use paper tape  . Doxycycline Nausea Only  . Flagyl [Metronidazole] Nausea Only  . Iron Nausea Only  . Oxycodone Other (See Comments)    NIGHTMARES. (tolerates hydrocodone or tramadol better)  . Penicillins Rash    Has patient had a PCN reaction causing immediate rash, facial/tongue/throat swelling, SOB or lightheadedness with hypotension: no Has patient had a PCN reaction causing severe rash involving mucus membranes or skin necrosis: no Has patient had a PCN reaction that required hospitalization no Has patient had a PCN reaction occurring within the last 10 years: no If all of the above answers are "NO", then may proceed with Cephalosporin use.     Family History  Problem Relation Age of Onset  . Coronary artery disease Mother 53  . Hypertension Mother   . Hyperlipidemia Mother   . Diabetes Mellitus I Mother   . Coronary artery disease Father   . Hyperlipidemia Father   . Hypertension Father   . Cancer Sister        skin - non melanoma  . Hyperlipidemia Brother   . Cancer Maternal Uncle 3       pancreatic with mets to colon and prostate  . Cirrhosis Maternal Uncle   . Hypertension Maternal Grandmother     . Diabetes Mellitus I Maternal Grandmother   . Hyperlipidemia Maternal Grandmother   . CVA Maternal Grandmother   . Hypertension Maternal Grandfather   . Coronary artery disease Maternal Grandfather 65  . Hyperlipidemia Maternal Grandfather   . Coronary artery disease Paternal Grandmother   . Hypertension Paternal Grandmother   . Hyperlipidemia Paternal Grandmother   . Diabetes Mellitus I Paternal Grandmother   .  Hypertension Paternal Grandfather   . Hyperlipidemia Paternal Grandfather   . Coronary artery disease Paternal Grandfather     Prior to Admission medications   Medication Sig Start Date End Date Taking? Authorizing Provider  Cholecalciferol (VITAMIN D3) 5000 units CAPS Take 5,000 Units by mouth at bedtime.     [provider]  Coenzyme Q10 (COQ10) 200 MG CAPS Take 200 mg by mouth at bedtime.    [provider]  doxycycline (VIBRA-TABS) 100 MG tablet Take 1 tablet (100 mg total) by mouth 2 (two) times daily. 07/02/19   Golden Circle, FNP  DULoxetine (CYMBALTA) 60 MG capsule Take 120 mg by mouth at bedtime.     [provider]  esomeprazole (NEXIUM) 40 MG capsule Take 1 capsule (40 mg total) by mouth 2 (two) times daily before a meal. 12/22/15   Michael Boston, MD  furosemide (LASIX) 20 MG tablet Take 20 mg by mouth daily as needed for edema.     [provider]  gabapentin (NEURONTIN) 300 MG capsule Take 900 mg by mouth 4 (four) times daily.    [provider]  HYDROcodone-acetaminophen (NORCO) 10-325 MG tablet Take 1-2 tablets by mouth every 6 (six) hours as needed for moderate pain or severe pain. 07/26/18   Michael Boston, MD  levofloxacin (LEVAQUIN) 750 MG tablet Take 1 tablet (750 mg total) by mouth daily. 07/02/19 08/01/19  Golden Circle, FNP  Melatonin 3 MG TABS Take 3-6 mg by mouth at bedtime.     [provider]  meloxicam (MOBIC) 15 MG tablet Take 15 mg by mouth at bedtime.     [provider]  morphine  (MSIR) 30 MG tablet Take 1 tablet (30 mg total) by mouth every 6 (six) hours as needed for severe pain. 07/08/19   Alla Feeling, NP  Multiple Vitamin (MULTIVITAMIN WITH MINERALS) TABS tablet Take 2 tablets by mouth at bedtime.     [provider]  nebivolol (BYSTOLIC) 5 MG tablet Take 10 mg by mouth at bedtime.     [provider]  polyethylene glycol (MIRALAX / GLYCOLAX) packet Take 17 g by mouth daily as needed for moderate constipation.    [provider]  rosuvastatin (CRESTOR) 5 MG tablet Take 5 mg by mouth at bedtime.    [provider]  tiZANidine (ZANAFLEX) 4 MG tablet Take 4 mg by mouth 3 (three) times daily as needed for muscle spasms.  05/28/18   [provider]  triamcinolone lotion (KENALOG) 0.1 % Apply 1 application topically as needed (dry skin).    [provider]    Physical Exam:  Constitutional: NAD, calm, comfortable Vitals:   07/15/19 0630 07/15/19 0645 07/15/19 0700 07/15/19 0730  BP:  125/65 (!) 133/102 132/68  Pulse: 79 76  84  Resp:  _0 Temp:      TempSrc:      SpO2: 93% 90%  98%  Weight:      Height:       Eyes: PERRL, lids and conjunctivae normal ENMT: Mucous membranes are moist. Posterior pharynx clear of any exudate or lesions.Normal dentition.  Neck: normal, supple, no masses, no thyromegaly Respiratory: clear to auscultation bilaterally, no wheezing, no crackles. Normal respiratory effort. No accessory muscle use.  Cardiovascular: Regular rate and rhythm, no murmurs / rubs / gallops. No extremity edema. 2+ pedal pulses. No carotid bruits.  Abdomen: no tenderness, no masses palpated. No hepatosplenomegaly. Bowel sounds positive.  Musculoskeletal: no clubbing /  cyanosis. No joint deformity upper and lower extremities. Good ROM, no contractures. Normal muscle tone.  Skin: no rashes, lesions, ulcers. No induration Neurologic: CN 2-12 grossly intact. Sensation intact, DTR normal. Strength 5/5 in  all 4.  Psychiatric: Normal judgment and insight. Alert and oriented x 3. Normal mood.     Labs on Admission: I have personally reviewed following labs and imaging studies  CBC: Recent Labs  Lab 07/15/19 0154  WBC 9.6  NEUTROABS 8.3*  HGB 11.3*  HCT 35.8*  MCV 86.9  PLT 893   Basic Metabolic Panel: Recent Labs  Lab 07/15/19 0154  NA 138  K 3.9  CL 101  CO2 25  GLUCOSE 119*  BUN 6  CREATININE 0.52  CALCIUM 9.9   GFR: Estimated Creatinine Clearance: 133.2 mL/min (by C-G formula based on SCr of 0.52 mg/dL). Liver Function Tests: Recent Labs  Lab 07/15/19 0154  AST 12*  ALT 11  ALKPHOS 75  BILITOT 0.8  PROT 7.1  ALBUMIN 3.4*   No results for input(s): LIPASE, AMYLASE in the last 168 hours. No results for input(s): AMMONIA in the last 168 hours. Coagulation Profile: No results for input(s): INR, PROTIME in the last 168 hours. Cardiac Enzymes: No results for input(s): CKTOTAL, CKMB, CKMBINDEX, TROPONINI in the last 168 hours. BNP (last 3 results) No results for input(s): PROBNP in the last 8760 hours. HbA1C: No results for input(s): HGBA1C in the last 72 hours. CBG: No results for input(s): GLUCAP in the last 168 hours. Lipid Profile: No results for input(s): CHOL, HDL, LDLCALC, TRIG, CHOLHDL, LDLDIRECT in the last 72 hours. Thyroid Function Tests: No results for input(s): TSH, T4TOTAL, FREET4, T3FREE, THYROIDAB in the last 72 hours. Anemia Panel: No results for input(s): VITAMINB12, FOLATE, FERRITIN, TIBC, IRON, RETICCTPCT in the last 72 hours. Urine analysis:    Component Value Date/Time   COLORURINE YELLOW 07/15/2019 0211   APPEARANCEUR HAZY (A) 07/15/2019 0211   LABSPEC 1.009 07/15/2019 0211   LABSPEC 1.010 02/26/2016 1604   PHURINE 9.0 (H) 07/15/2019 0211   GLUCOSEU NEGATIVE 07/15/2019 0211   GLUCOSEU Negative 02/26/2016 1604   HGBUR SMALL (A) 07/15/2019 0211   BILIRUBINUR NEGATIVE 07/15/2019 0211   BILIRUBINUR Negative 02/26/2016 1604    KETONESUR 5 (A) 07/15/2019 0211   PROTEINUR NEGATIVE 07/15/2019 0211   UROBILINOGEN 0.2 02/26/2016 1604   NITRITE NEGATIVE 07/15/2019 0211   LEUKOCYTESUR MODERATE (A) 07/15/2019 0211   LEUKOCYTESUR Trace 02/26/2016 1604   Sepsis Labs: No results found for this or any previous visit (from the past 240 hour(s)).   Radiological Exams on Admission: MR LUMBAR SPINE WO CONTRAST  Result Date: 07/15/2019 CLINICAL DATA:  Low back pain with progressive neurologic deficit EXAM: MRI LUMBAR SPINE WITHOUT CONTRAST TECHNIQUE: Multiplanar, multisequence MR imaging of the lumbar spine was performed. No intravenous contrast was administered. COMPARISON:  06/26/2019 FINDINGS: Truncated study due to patient pain. Only sagittal imaging was acquired, without contrast. Redemonstrated changes of discitis osteomyelitis at L3-4 with disc space purulence and endplate destruction. The disc space appears taller and more T2 hyperintense. There is paraspinal inflammation extending into the bilateral psoas which is progressed based on sagittal STIR imaging. No evidence of dorsal epidural abscess. T2 hyperintense material ventral to the L4 body follows fat signal and is unchanged. There is L3-4 high-grade spinal stenosis from degenerative posterior element hypertrophy, disc bulging, and epidural fat expansion. Pre-existing L4-5 PLIF without evident solid fusion by recent abdominal CT IMPRESSION: 1. Truncated noncontrast study due to patient pain. 2.  L3-4 discitis osteomyelitis with progressive paravertebral inflammation/phlegmon compared to 06/26/2019. Intervertebral widening and purulence has likely also progressed. 3. High-grade thecal sac narrowing at L3-4/L4 attributed to degenerative changes and epidural fat expansion. No change in epidural soft tissues to imply interval abscess. Electronically Signed   By: Monte Fantasia M.D.   On: 07/15/2019 06:54      Assessment/Plan Discitis osteomyelitis of the lumbar spine acute on  chronic.  Patient presents with worsening back pain with inability to ambulate due to pain.  MRI showing worsening L3-4 discitis osteomyelitis with aggressive prevertebral inflammation and signs of high-grade thecal sac narrowing.  Inflammatory markers elevated including CRP 11.9 and ESR 102.  Neurosurgery was consulted, but patient was not a surgical candidate at this time and previous IR guided aspirate cultures obtained from 12/17 did not show any growth.  -Admit to a medical telemetry bed -Start IV vancomycin and Rocephin -Morphine sulfate/Dilaudid IV as needed moderate and severe pain respectively -PT to evaluate and treat in a.m. -Consult transitions of care team for issues with receiving home IV antibiotics -Dr. Drucilla Schmidt of ID was consulted, will follow-up for further recommendations -Appreciate neurosurgery consultative services, will follow for further recommendation  Urinary tract infection: Present on admission.  Patient had been on antibiotics of Levaquin and doxycycline p.o.  Urinalysis noting moderate leukocytes with rare bacteria, and 11-20 WBCs. -Check urine cultures -Antibiotics as seen above  Chronic pain: At home patient had been on hydrocodone and morphine sulfate. -Continued morphine sulfate  Essential hypertension: Home medications include Bystolic 10 mg daily and furosemide 20 mg as needed 2 times per week for edema. -Continue Bystolic  Depression: Home medications include Cymbalta DR capsule 120 mg daily. -Continue Cymbalta  Anemia of chronic disease: Hemoglobin 11.3 which appears near patient's baseline.  -Continue to monitor  Rectal adenocarcinoma: Patient s/p LAR resection in 05/2015, chemotherapy, and radiation.  Followed in the outpatient setting by Dr. Burr Medico. -Continue outpatient follow-up  Hyperlipidemia: Home medication includes Crestor 5 mg at bedtime -Continue statin  GERD -Continue pharmacy substitution for Nexium  Morbid obesity: Patient's BMI 47.4  kg/m  DVT prophylaxis: Lovenox Code Status: Full Family Communication: No family present at bedside Disposition Plan: To be determined Consults called: Neurosurgery Admission status: Inpatient  Norval Morton MD [facility] Pager [phone]   If 7PM-7AM, please contact night-coverage [email or web address] Password [email or web address]  07/15/2019, 7:58 AM

## 2019-07-15 NOTE — ED Notes (Signed)
Pt SpO2 of 86% when sleeping. Pt placed on 2L [place] by this RN. Pt reports "it always does this when I'm sleeping"

## 2019-07-15 NOTE — Consult Note (Signed)
Reason for Consult: discitis osteomyelitis  Referring Physician: edp  MASIE BERMINGHAM is an 53 y.o. female.   HPI:  53 year old female presented to the ED last night after unrelenting back pain that has gotten significantly worse since Sunday. States that she has been unable to move very much the last couple days because the pain has been so severe. History of PLIF L4-5 last year. Her oncologist ordered a ct of her back and then an mri of her lumbar spine which showed discitis osteomyelitis at L3-4. She was started on oral antibiotics one week ago. She did have a Picc line placed but it was removed  After her insurance company denies IV abx at home. She has pain in both hips and thighs but denies numbness or tingling. She feels weak in her legs because of her back pain. She was unable to complete the entire MRI today because the pain was so severe.   Past Medical History:  Diagnosis Date  . Anxiety   . Chemotherapy-induced neuropathy (HCC)    toes and fingers numbness and tingling  . Colorectal delayed anastomotic leak s/p resection & colostomy 07/12/2016 11/17/2015  . Colostomy in place [facility]) 07/12/2016   due to anastomosis breakdown w/ colovaginal fistula  . Colovaginal fistula s/p omentopexy repair 07/12/2016 07/10/2016  . Complication of anesthesia   . Depression   . Family history of adverse reaction to anesthesia    mother-- ponv  . Family history of breast cancer   . Family history of pancreatic cancer   . Family history of skin cancer   . Gastroenteritis 12/20/2015  . GERD (gastroesophageal reflux disease)   . Hiatal hernia   . History of cancer chemotherapy 02-20-2015 to 03-30-2015  . History of cardiac murmur as a child   . History of chemotherapy   . History of chronic gastritis   . History of hypertension    no issues since multiple abdminal sx's and chemo--- no medication since 12/ 2016  . History of TMJ disorder   . Hypercholesteremia   . Hypertension   . IBS  (irritable bowel syndrome)    dx age 87  . Intermittent abdominal pain    post-op multiple abdominal sx's   . Microcytic anemia   . Mild sleep apnea    per pt study 2014  very mild osa , no cpap recommended, recommeded wt loss and sleep routine  . OA (osteoarthritis)    left knee /  left shoulder  . Obesity   . PONV (postoperative nausea and vomiting)    severe" needs Scopolamine PATCH"   . Portacath in place    right chest  . Rectal adenocarcinoma ([place]) oncologis-  dr Burr Medico-- after radiation/ chemo (ypT1, N0) --  no recurrence per last note 03/ 2018   dx 01-13-2015-- Stage IIIC (T3, N2, M0) post concurrent radiation and chemotherapy 02-20-2015 to 03-30-2015 /  05-25-2015 s/p  LAR w/ RSO (post-op complicated by late abscess and contained anatomotic leak w/ help percutaneous drainage and antibiotics)   . Rotator cuff tear, left   . S/P radiation therapy 02/20/15-03/30/15   colon/rectal  . Vitamin D deficiency   . Wears glasses   . Wears glasses     Past Surgical History:  Procedure Laterality Date  . ABDOMINAL HYSTERECTOMY  1996   uterus and cervix  . BACK SURGERY  02/12/2018   lumbar surgery  . COLON RESECTION N/A 07/12/2016   Procedure: LAPAROSCOPIC LYSIS OF ADHESIONS, OMENTOPEXY, HAND ASSISTED RESECTION OF  COLON, END TO END ANASTOMOSIS, COLOSTOMY;  Surgeon: Michael Boston, MD;  Location: [facility];  Service: General;  Laterality: N/A;  . COMBINED HYSTEROSCOPY DIAGNOSTIC / D&C  x2 1990's  . DIAGNOSTIC LAPAROSCOPY  age 89 and age 56  . EUS N/A 01/18/2015   Procedure: LOWER ENDOSCOPIC ULTRASOUND (EUS);  Surgeon: Arta Silence, MD;  Location: [facility];  Service: Endoscopy;  Laterality: N/A;  . EXCISION OF SKIN TAG  11/17/2015   Procedure: EXCISION OF SKIN TAG;  Surgeon: Michael Boston, MD;  Location: [facility];  Service: General;;  . ILEO LOOP COLOSTOMY CLOSURE N/A 11/17/2015   Procedure: LAPAROSCOPIC DIVERTING LOOP ILEOSTOMY  DRAINAGE OF PELVIC ABSCESS;  Surgeon: Michael Boston, MD;  Location:  [facility];  Service: General;  Laterality: N/A;  . ILEOSTOMY CLOSURE N/A 05/31/2016   Procedure: TAKEDOWN LOOP ILEOSTOMY;  Surgeon: Michael Boston, MD;  Location: [facility];  Service: General;  Laterality: N/A;  . IMPACTION REMOVAL  11/17/2015   Procedure: DISIMPACTION REMOVAL;  Surgeon: Michael Boston, MD;  Location: [facility];  Service: General;;  . INSERTION OF MESH N/A 07/23/2018   Procedure: INSERTION OF MESH X2;  Surgeon: Michael Boston, MD;  Location: [facility];  Service: General;  Laterality: N/A;  . IR LUMBAR [place] W/IMG GUIDE  07/01/2019  . KNEE ARTHROSCOPY Left 1990's  . KNEE ARTHROSCOPY W/ MENISCECTOMY Left 09/14/2009   and chondroplasty debridement  . LAPAROSCOPIC LYSIS OF ADHESIONS  11/17/2015   Procedure: LAPAROSCOPIC LYSIS OF ADHESIONS;  Surgeon: Michael Boston, MD;  Location: [facility];  Service: General;;  . LUMBAR WOUND DEBRIDEMENT N/A 04/24/2018   Procedure: wound exploration, irrigation and debridement;  Surgeon: Eustace Moore, MD;  Location: [place];  Service: Neurosurgery;  Laterality: N/A;  wound exploration, irrigation and debridement  . LYSIS OF ADHESION N/A 07/23/2018   Procedure: LYSIS OF ADHESIONS;  Surgeon: Michael Boston, MD;  Location: [facility];  Service: General;  Laterality: N/A;  . PORT-A-CATH REMOVAL N/A 11/21/2016   Procedure: REMOVAL PORT-A-CATH;  Surgeon: Michael Boston, MD;  Location: [facility];  Service: General;  Laterality: N/A;  . PORTACATH PLACEMENT N/A 05/25/2015   Procedure: INSERTION PORT-A-CATH;  Surgeon: Michael Boston, MD;  Location: [facility];  Service: General;  Laterality: N/A;-remains inplace Right chest.  . ROTATOR CUFF REPAIR Right 2006  . TONSILLECTOMY  age 40  . VENTRAL HERNIA REPAIR N/A 07/23/2018   Procedure: LAPAROSCOPIC VENTRAL WALL HERNIA REPAIR;  Surgeon: Michael Boston, MD;  Location: [facility];  Service: General;  Laterality: N/A;  . XI ROBOTIC ASSISTED LOWER ANTERIOR RESECTION N/A 05/25/2015   Procedure: XI ROBOTIC ASSISTED LOWER ANTERIOR  RESECTION, , RIGID PROCTOSCOPY, RIGHT OOPHORECTOMY;  Surgeon: Michael Boston, MD;  Location: [facility];  Service: General;  Laterality: N/A;    Allergies  Allergen Reactions  . Caine-1 [Lidocaine] Swelling and Rash    Eyes swell shut; includes all caine drugs except marcaine. EMLA cream OK though (?!)  . Sulfa Antibiotics Nausea And Vomiting and Rash  . Adhesive [Tape] Other (See Comments)    Blisters - can use paper tape  . Doxycycline Nausea Only  . Flagyl [Metronidazole] Nausea Only  . Iron Nausea Only  . Oxycodone Other (See Comments)    NIGHTMARES. (tolerates hydrocodone or tramadol better)  . Penicillins Rash    Has patient had a PCN reaction causing immediate rash, facial/tongue/throat swelling, SOB or lightheadedness with hypotension: no Has patient had a PCN reaction causing severe rash involving mucus membranes or skin necrosis: no Has patient  had a PCN reaction that required hospitalization no Has patient had a PCN reaction occurring within the last 10 years: no If all of the above answers are "NO", then may proceed with Cephalosporin use.     Social History   Tobacco Use  . Smoking status: Former Smoker    Packs/day: 0.25    Years: 7.00    Pack years: 1.75    Types: Cigarettes    Quit date: 07/16/1991    Years since quitting: 28.0  . Smokeless tobacco: Never Used  Substance Use Topics  . Alcohol use: No    Family History  Problem Relation Age of Onset  . Coronary artery disease Mother 26  . Hypertension Mother   . Hyperlipidemia Mother   . Diabetes Mellitus I Mother   . Coronary artery disease Father   . Hyperlipidemia Father   . Hypertension Father   . Cancer Sister        skin - non melanoma  . Hyperlipidemia Brother   . Cancer Maternal Uncle 95       pancreatic with mets to colon and prostate  . Cirrhosis Maternal Uncle   . Hypertension Maternal Grandmother   . Diabetes Mellitus I Maternal Grandmother   . Hyperlipidemia Maternal Grandmother   . CVA  Maternal Grandmother   . Hypertension Maternal Grandfather   . Coronary artery disease Maternal Grandfather 65  . Hyperlipidemia Maternal Grandfather   . Coronary artery disease Paternal Grandmother   . Hypertension Paternal Grandmother   . Hyperlipidemia Paternal Grandmother   . Diabetes Mellitus I Paternal Grandmother   . Hypertension Paternal Grandfather   . Hyperlipidemia Paternal Grandfather   . Coronary artery disease Paternal Grandfather      Review of Systems  Positive ROS: as above  All other systems have been reviewed and were otherwise negative with the exception of those mentioned in the HPI and as above.  Objective: Vital signs in last 24 hours: Temp:  [98.3 F (36.8 C)-98.6 F (37 C)] 98.3 F (36.8 C) (12/31 2317) Pulse Rate:  [65-116] 65 (12/31 2317) Resp:  [12-22] 17 (12/31 2317) BP: (86-140)/(41-104) 113/41 (12/31 2317) SpO2:  [87 %-100 %] 97 % (12/31 2317) FiO2 (%):  [100 %] 100 % (12/31 1744)  General Appearance: Alert, cooperative, no distress, appears stated age Head: Normocephalic, without obvious abnormality, atraumatic Eyes: PERRL, conjunctiva/corneas clear, EOM's intact, fundi benign, both eyes      Lungs:  respirations unlabored Heart: Regular rate and rhythm  NEUROLOGIC:   Mental status: A&O x4, no aphasia, good attention span, Memory and fund of knowledge Motor Exam - grossly normal, normal tone and bulk Sensory Exam - grossly normal Reflexes: symmetric, no pathologic reflexes, No Hoffman's, No clonus Coordination - grossly normal Gait - unable to test Balance - unable to test Cranial Nerves: I: smell Not tested  II: visual acuity  OS: na    OD: na  II: visual fields Full to confrontation  II: pupils Equal, round, reactive to light  III,VII: ptosis   III,IV,VI: extraocular muscles    V: mastication   V: facial light touch sensation    V,VII: corneal reflex    VII: facial muscle function - upper    VII: facial muscle function - lower    VIII: hearing   IX: soft palate elevation    IX,X: gag reflex   XI: trapezius strength    XI: sternocleidomastoid strength   XI: neck flexion strength    XII: tongue strength  Data Review Lab Results  Component Value Date   WBC 9.6 07/15/2019   HGB 11.3 (L) 07/15/2019   HCT 35.8 (L) 07/15/2019   MCV 86.9 07/15/2019   PLT 313 07/15/2019   Lab Results  Component Value Date   NA 138 07/15/2019   K 3.9 07/15/2019   CL 101 07/15/2019   CO2 25 07/15/2019   BUN 6 07/15/2019   CREATININE 0.52 07/15/2019   GLUCOSE 119 (H) 07/15/2019   Lab Results  Component Value Date   INR 0.9 07/01/2019    Radiology: MR LUMBAR SPINE WO CONTRAST  Result Date: 07/15/2019 CLINICAL DATA:  Low back pain with progressive neurologic deficit EXAM: MRI LUMBAR SPINE WITHOUT CONTRAST TECHNIQUE: Multiplanar, multisequence MR imaging of the lumbar spine was performed. No intravenous contrast was administered. COMPARISON:  06/26/2019 FINDINGS: Truncated study due to patient pain. Only sagittal imaging was acquired, without contrast. Redemonstrated changes of discitis osteomyelitis at L3-4 with disc space purulence and endplate destruction. The disc space appears taller and more T2 hyperintense. There is paraspinal inflammation extending into the bilateral psoas which is progressed based on sagittal STIR imaging. No evidence of dorsal epidural abscess. T2 hyperintense material ventral to the L4 body follows fat signal and is unchanged. There is L3-4 high-grade spinal stenosis from degenerative posterior element hypertrophy, disc bulging, and epidural fat expansion. Pre-existing L4-5 PLIF without evident solid fusion by recent abdominal CT IMPRESSION: 1. Truncated noncontrast study due to patient pain. 2. L3-4 discitis osteomyelitis with progressive paravertebral inflammation/phlegmon compared to 06/26/2019. Intervertebral widening and purulence has likely also progressed. 3. High-grade thecal sac narrowing at  L3-4/L4 attributed to degenerative changes and epidural fat expansion. No change in epidural soft tissues to imply interval abscess. Electronically Signed   By: Monte Fantasia M.D.   On: 07/15/2019 06:54    Assessment/Plan: Patient presented to the ED last night after unrelenting back pain. Only sagittal images available on MRI but this does show progressive discitis osteomyelitis at L3-4 compared to her imaging 2 weeks ago. No epidural component noted on mri. This is obviously because of failure of treatment with oral abx since her insurance denies IV abx. ID consult for guidance on abx treatment. There is no surgical intervention needed at this time. Will need 6 weeks of iv abx followed by atleast 6 weeks of oral abx.   Ocie Cornfield Lilianne Delair 07/15/2019 11:46 PM

## 2019-07-15 NOTE — ED Notes (Signed)
Spoke with pharmacist to verify dose of Vancomycin and to verify additional medication to be able to administer to patient.

## 2019-07-15 NOTE — ED Notes (Signed)
MD noti

## 2019-07-15 NOTE — ED Notes (Signed)
Patient called out stated lower back pain increased to 8/10 sharp radiating to bilateral lower extremities with increased nausea.

## 2019-07-15 NOTE — Progress Notes (Signed)
Complaining of continued severe pain despite morphine sulfate every 6 hours as needed for moderate pain, and IV Dilaudid 1 mg every 3 hours as needed for severe pain.  Starting patient on a Dilaudid PCA pump to try and achieve pain control.

## 2019-07-16 LAB — CBC
HCT: 35.9 % — ABNORMAL LOW (ref 36.0–46.0)
Hemoglobin: 11.1 g/dL — ABNORMAL LOW (ref 12.0–15.0)
MCH: 27.5 pg (ref 26.0–34.0)
MCHC: 30.9 g/dL (ref 30.0–36.0)
MCV: 88.9 fL (ref 80.0–100.0)
Platelets: 292 10*3/uL (ref 150–400)
RBC: 4.04 MIL/uL (ref 3.87–5.11)
RDW: 13.7 % (ref 11.5–15.5)
WBC: 8.1 10*3/uL (ref 4.0–10.5)
nRBC: 0 % (ref 0.0–0.2)

## 2019-07-16 LAB — BASIC METABOLIC PANEL
Anion gap: 9 (ref 5–15)
BUN: 9 mg/dL (ref 6–20)
CO2: 27 mmol/L (ref 22–32)
Calcium: 9.6 mg/dL (ref 8.9–10.3)
Chloride: 101 mmol/L (ref 98–111)
Creatinine, Ser: 0.6 mg/dL (ref 0.44–1.00)
GFR calc Af Amer: >60 mL/min (ref >60)
GFR calc non Af Amer: >60 mL/min (ref >60)
Glucose, Bld: 100 mg/dL — ABNORMAL HIGH (ref 70–99)
Potassium: 3.9 mmol/L (ref 3.5–5.1)
Sodium: 137 mmol/L (ref 135–145)

## 2019-07-16 LAB — SEDIMENTATION RATE: Sed Rate: 84 mm/hr — ABNORMAL HIGH (ref 0–22)

## 2019-07-16 LAB — URINE CULTURE: Culture: NO GROWTH

## 2019-07-16 LAB — HEPATITIS B SURFACE ANTIGEN: Hepatitis B Surface Ag: NONREACTIVE

## 2019-07-16 LAB — C-REACTIVE PROTEIN: CRP: 10.3 mg/dL — ABNORMAL HIGH (ref <1.0)

## 2019-07-16 MED ORDER — METHOCARBAMOL 500 MG PO TABS
500.0000 mg | ORAL_TABLET | Freq: Once | ORAL | Status: AC
  Administered 2019-07-16: 500 mg via ORAL
  Filled 2019-07-16: qty 1

## 2019-07-16 NOTE — Telephone Encounter (Signed)
I saw her yesterday in the emergency department she is being admitted if the co-pay of 1000/week can be worked out or some other arrangement can be worked out I would recommend 6 weeks of vancomycin and ceftriaxone as originally planned

## 2019-07-16 NOTE — Evaluation (Signed)
Occupational Therapy Evaluation Patient Details Name: Lindsay Little MRN: [id number] DOB: Nov 27, 1965 Today's Date: 07/16/2019    History of Present Illness 54yo female with history of discitis with symptoms since October 2020, getting progressively worse; she apparently had a PICC placed but did not use it due to high copays for the medications. Hypoxic in ED. MRI shows L3-L4 discitis osteomyelitis with progressive perivertebral inflammation and high grade thecal sac narrowing at L3-L4. PMH rectal CA, chemo induced neuropathy, colostomy placement, HTN, IBS, HLD, HTN, obesity, hx rotator cuff tear, lumbar surgery 2019, colon resection, knee arthroscopy, hernia repair   Clinical Impression   PTA, pt was living with her husband and two children and was independent and working from home up to two weeks ago; once pain stated, she has required assistance from her husband for LB ADLs. Pt currently limited by significant pain and requiring Total A for dressing, bathing, and toileting. Pt agreeable to bed level assessment but declined any mobility. Pt would benefit from further acute OT to facilitate safe dc. Pending pt progress, recommend dc to home with HHOT for further OT to optimize safety, independence with ADLs, and return to PLOF.      Follow Up Recommendations  Home health OT;Supervision/Assistance - 24 hour    Equipment Recommendations  Other (comment);3 in 1 bedside commode(Bari)    Recommendations for Other Services PT consult     Precautions / Restrictions Precautions Precautions: Fall;Back;Other (comment) Precaution Booklet Issued: No Precaution Comments: severe back pain Restrictions Weight Bearing Restrictions: No      Mobility Bed Mobility               General bed mobility comments: Declined to perform bed mobility  Transfers                 General transfer comment: deferred- pain    Balance                                            ADL either performed or assessed with clinical judgement   ADL Overall ADL's : Needs assistance/impaired                                       General ADL Comments: Pt currently limited by significant pain. Agreeabel to ROM at bed level. Provided education on sequencing for log roll and pt reporting she will practice rolling with staff.      Vision Baseline Vision/History: Wears glasses Wears Glasses: At all times Patient Visual Report: No change from baseline       Perception     Praxis      Pertinent Vitals/Pain Pain Assessment: 0-10 Pain Score: 10-Worst pain ever Pain Location: back and hips with movement Pain Descriptors / Indicators: Radiating;Sharp;Squeezing;Stabbing;Grimacing;Moaning Pain Intervention(s): Monitored during session;Repositioned     Hand Dominance     Extremity/Trunk Assessment Upper Extremity Assessment Upper Extremity Assessment: Overall WFL for tasks assessed   Lower Extremity Assessment Lower Extremity Assessment: Defer to PT evaluation   Cervical / Trunk Assessment Cervical / Trunk Assessment: Other exceptions Cervical / Trunk Exceptions: discitis. Back surgery 2019   Communication Communication Communication: No difficulties   Cognition Arousal/Alertness: Awake/alert Behavior During Therapy: WFL for tasks assessed/performed Overall Cognitive Status: Within Functional Limits for tasks assessed  General Comments  Father present throughout session    Exercises     Shoulder Instructions      Home Living Family/patient expects to be discharged to:: Private residence Living Arrangements: Spouse/significant other;Children(18 yo with CP and 43 yo) Available Help at Discharge: Family;Available 24 hours/day Type of Home: House Home Access: Stairs to enter;Ramped entrance(Ramp in back) Entrance Stairs-Number of Steps: 4 Entrance Stairs-Rails: Right;Left;Can reach  both Home Layout: One level     Bathroom Shower/Tub: Occupational psychologist: Standard     Home Equipment: Clinical cytogeneticist - 2 wheels;Grab bars - tub/shower;Electric scooter;Adaptive equipment Adaptive Equipment: Reacher;Other (Comment)(toilet aid)        Prior Functioning/Environment Level of Independence: Independent        Comments: Last two weeks has needed  help with BADLs including LB ADLs. Using AE for LB ADLs and husband assisting. Normally independent and working from home        OT Problem List: Decreased range of motion;Decreased strength;Decreased activity tolerance;Impaired balance (sitting and/or standing);Decreased knowledge of use of DME or AE;Decreased knowledge of precautions;Pain      OT Treatment/Interventions: Self-care/ADL training;Therapeutic exercise;DME and/or AE instruction;Energy conservation;Therapeutic activities;Patient/family education    OT Goals(Current goals can be found in the care plan section) Acute Rehab OT Goals Patient Stated Goal: "Get this pain under control" OT Goal Formulation: With patient Time For Goal Achievement: 07/30/19 Potential to Achieve Goals: Good  OT Frequency: Min 3X/week   Barriers to D/C:            Co-evaluation PT/OT/SLP Co-Evaluation/Treatment: Yes Reason for Co-Treatment: Other (comment)(Significant pain) PT goals addressed during session: Strengthening/ROM OT goals addressed during session: ADL's and self-care      AM-PAC OT "6 Clicks" Daily Activity     Outcome Measure Help from another person eating meals?: A Little Help from another person taking care of personal grooming?: A Little Help from another person toileting, which includes using toliet, bedpan, or urinal?: Total Help from another person bathing (including washing, rinsing, drying)?: Total Help from another person to put on and taking off regular upper body clothing?: Total Help from another person to put on and taking off  regular lower body clothing?: Total 6 Click Score: 10   End of Session Nurse Communication: Mobility status  Activity Tolerance: Patient tolerated treatment well Patient left: in bed;with call bell/phone within reach;with family/visitor present  OT Visit Diagnosis: Unsteadiness on feet (R26.81);Other abnormalities of gait and mobility (R26.89);Muscle weakness (generalized) (M62.81);Pain Pain - part of body: (Back)                Time: 3474-2595 OT Time Calculation (min): 15 min Charges:  OT General Charges $OT Visit: 1 Visit OT Evaluation $OT Eval Moderate Complexity: [place], OTR/L Acute Rehab Pager: [phone] Office: [place] 07/16/2019, 3:35 PM

## 2019-07-16 NOTE — Progress Notes (Signed)
PROGRESS NOTE    Lindsay Little  YIF:[id number] DOB: 1965/09/21 DOA: 07/14/2019 PCP: [name], MD   Brief Narrative: Lindsay Little is a 54 y.o. female with medical history significant of discitis, rectal adenocarcinoma s/p radiation, s/p colostomy, chronic pain. Patient presented secondary to worsening back pain with leg weakness in setting of known L3-4 discitis.    Assessment & Plan:   Principal Problem:   Discitis Active Problems:   Pure hypercholesterolemia   Morbid obesity with body mass index of 40.0-49.9 (HCC)   Rectal adenocarcinoma s/p LAR resection 05/25/2015   Hypertension   Anemia of chronic disease   Chronic pain syndrome   Urinary tract infection   Discitis Involving L3-4. Patient was being treated for discitis with oral antibiotics as an outpatient with worsening symptoms now requiring IV antibiotics. Neurosurgery consulted and recommended no surgical management. Blood cultures (12/31) no growth to date. Afebrile. No leukocytosis. CRP and ESR significantly elevated on admission.. Patient with severe pain. Started on PCA pump with mild improvement -ID recommendations: Vancomycin and Ceftriaxone x 6 weeks -Continue dilaudid PCA, home morphine IR, naloxone prn for overdose -Continue oxygen via [place] while on PCA pump -Palliative care consult for pain management -PT eval  Chronic pain syndrome Patient is on morphine IR q6 hours as an outpatient. Pain management as mentioned above. -Continue Cymbalta  Essential hypertension Patient is on Bystolic as an outpatient. -Continue Bystolic  Hyperlipidemia -Continue Crestor  History of rectal adenocarcinoma S/p LAR resection, permanent colostomy.  Morbid obesity Body mass index is 47.4 kg/m.   DVT prophylaxis: Lovenox Code Status:   Code Status: Full Code Family Communication: None Disposition Plan: Discharge pending transition to oral pain regimen and ability to receive IV antibiotics as an  outpatient   Consultants:   Infectious disease  Palliative care medicine  Procedures:   None  Antimicrobials:  Vancomycin  Ceftriaxone    Subjective: Significant back pain.  Objective: Vitals:   07/16/19 0057 07/16/19 0417 07/16/19 0800 07/16/19 0852  BP:    129/62  Pulse:    64  Resp: _0 Temp:    98.1 F (36.7 C)  TempSrc:    Oral  SpO2: 98% 97% 99% 99%  Weight:      Height:        Intake/Output Summary (Last 24 hours) at 07/16/2019 1115 Last data filed at 07/16/2019 0856 Gross per 24 hour  Intake 950.43 ml  Output 700 ml  Net 250.43 ml   Filed Weights   07/14/19 1809  Weight: (!) 152 kg    Examination:  General exam: Appears calm and comfortable Respiratory system: Clear to auscultation. Respiratory effort normal. Cardiovascular system: S1 & S2 heard, RRR. Systolic murmur Gastrointestinal system: Abdomen is nondistended, soft and nontender. No organomegaly or masses felt. Normal bowel sounds heard. Central nervous system: Alert and oriented. No focal neurological deficits. Extremities: No edema. No calf tenderness Skin: No cyanosis. No rashes Psychiatry: Judgement and insight appear normal. Mood & affect appropriate.     Data Reviewed: I have personally reviewed following labs and imaging studies  CBC: Recent Labs  Lab 07/15/19 0154 07/16/19 0219  WBC 9.6 8.1  NEUTROABS 8.3*  --   HGB 11.3* 11.1*  HCT 35.8* 35.9*  MCV 86.9 88.9  PLT 313 867   Basic Metabolic Panel: Recent Labs  Lab 07/15/19 0154 07/16/19 0219  NA 138 137  K 3.9 3.9  CL 101 101  CO2 25 27  GLUCOSE 119* 100*  BUN 6 9  CREATININE 0.52 0.60  CALCIUM 9.9 9.6   GFR: Estimated Creatinine Clearance: 133.2 mL/min (by C-G formula based on SCr of 0.6 mg/dL). Liver Function Tests: Recent Labs  Lab 07/15/19 0154  AST 12*  ALT 11  ALKPHOS 75  BILITOT 0.8  PROT 7.1  ALBUMIN 3.4*   No results for input(s): LIPASE, AMYLASE in the last 168 hours. No results  for input(s): AMMONIA in the last 168 hours. Coagulation Profile: No results for input(s): INR, PROTIME in the last 168 hours. Cardiac Enzymes: No results for input(s): CKTOTAL, CKMB, CKMBINDEX, TROPONINI in the last 168 hours. BNP (last 3 results) No results for input(s): PROBNP in the last 8760 hours. HbA1C: No results for input(s): HGBA1C in the last 72 hours. CBG: No results for input(s): GLUCAP in the last 168 hours. Lipid Profile: No results for input(s): CHOL, HDL, LDLCALC, TRIG, CHOLHDL, LDLDIRECT in the last 72 hours. Thyroid Function Tests: No results for input(s): TSH, T4TOTAL, FREET4, T3FREE, THYROIDAB in the last 72 hours. Anemia Panel: No results for input(s): VITAMINB12, FOLATE, FERRITIN, TIBC, IRON, RETICCTPCT in the last 72 hours. Sepsis Labs: No results for input(s): PROCALCITON, LATICACIDVEN in the last 168 hours.  Recent Results (from the past 240 hour(s))  Blood culture (routine x 2)     Status: None (Preliminary result)   Collection Time: 07/15/19  1:45 AM   Specimen: BLOOD  Result Value Ref Range Status   Specimen Description BLOOD RIGHT ARM  Final   Special Requests   Final    BOTTLES DRAWN AEROBIC AND ANAEROBIC Blood Culture results may not be optimal due to an excessive volume of blood received in culture bottles   Culture   Final    NO GROWTH < 12 HOURS Performed at [facility], [place] [address], [place], [place] [address]    Report Status PENDING  Incomplete  Blood culture (routine x 2)     Status: None (Preliminary result)   Collection Time: 07/15/19  1:51 AM   Specimen: BLOOD  Result Value Ref Range Status   Specimen Description BLOOD LEFT HAND  Final   Special Requests   Final    BOTTLES DRAWN AEROBIC ONLY Blood Culture adequate volume   Culture   Final    NO GROWTH < 12 HOURS Performed at [facility], [place] [address], [place], [place] [address]    Report Status PENDING  Incomplete  SARS CORONAVIRUS 2 (TAT 6-24 HRS) Nasopharyngeal  Nasopharyngeal Swab     Status: None   Collection Time: 07/15/19  7:02 AM   Specimen: Nasopharyngeal Swab  Result Value Ref Range Status   SARS Coronavirus 2 NEGATIVE NEGATIVE Final    Comment: (NOTE) SARS-CoV-2 target nucleic acids are NOT DETECTED. The SARS-CoV-2 RNA is generally detectable in upper and lower respiratory specimens during the acute phase of infection. Negative results do not preclude SARS-CoV-2 infection, do not rule out co-infections with other pathogens, and should not be used as the sole basis for treatment or other patient management decisions. Negative results must be combined with clinical observations, patient history, and epidemiological information. The expected result is Negative. Fact Sheet for Patients: SugarRoll.be Fact Sheet for Healthcare Providers: https://www.woods-mathews.com/ This test is not yet approved or cleared by the [place] FDA and  has been authorized for detection and/or diagnosis of SARS-CoV-2 by FDA under an Emergency Use Authorization (EUA). This EUA will remain  in effect (meaning this test can be used) for the duration  of the COVID-19 declaration under Section 56 4(b)(1) of the Act, 21 U.S.C. section 360bbb-3(b)(1), unless the authorization is terminated or revoked sooner. Performed at [facility], [place] [address] [place], [place] [address]   Urine culture     Status: None   Collection Time: 07/15/19  8:45 AM   Specimen: Urine, Clean Catch  Result Value Ref Range Status   Specimen Description URINE, CLEAN CATCH  Final   Special Requests NONE  Final   Culture   Final    NO GROWTH Performed at [facility], [place] [address], [place], [place] [address]    Report Status 07/16/2019 FINAL  Final         Radiology Studies: MR LUMBAR SPINE WO CONTRAST  Result Date: 07/15/2019 CLINICAL DATA:  Low back pain with progressive neurologic deficit EXAM: MRI LUMBAR SPINE  WITHOUT CONTRAST TECHNIQUE: Multiplanar, multisequence MR imaging of the lumbar spine was performed. No intravenous contrast was administered. COMPARISON:  06/26/2019 FINDINGS: Truncated study due to patient pain. Only sagittal imaging was acquired, without contrast. Redemonstrated changes of discitis osteomyelitis at L3-4 with disc space purulence and endplate destruction. The disc space appears taller and more T2 hyperintense. There is paraspinal inflammation extending into the bilateral psoas which is progressed based on sagittal STIR imaging. No evidence of dorsal epidural abscess. T2 hyperintense material ventral to the L4 body follows fat signal and is unchanged. There is L3-4 high-grade spinal stenosis from degenerative posterior element hypertrophy, disc bulging, and epidural fat expansion. Pre-existing L4-5 PLIF without evident solid fusion by recent abdominal CT IMPRESSION: 1. Truncated noncontrast study due to patient pain. 2. L3-4 discitis osteomyelitis with progressive paravertebral inflammation/phlegmon compared to 06/26/2019. Intervertebral widening and purulence has likely also progressed. 3. High-grade thecal sac narrowing at L3-4/L4 attributed to degenerative changes and epidural fat expansion. No change in epidural soft tissues to imply interval abscess. Electronically Signed   By: Monte Fantasia M.D.   On: 07/15/2019 06:54        Scheduled Meds: . DULoxetine  120 mg Oral QHS  . enoxaparin (LOVENOX) injection  0.5 mg/kg Subcutaneous Q24H  . HYDROmorphone   Intravenous Q4H  . Melatonin  6 mg Oral QHS  . nebivolol  10 mg Oral QHS  . pantoprazole  40 mg Oral BID  . rosuvastatin  5 mg Oral QHS  . senna-docusate  1 tablet Oral BID  . sodium chloride flush  3 mL Intravenous Q12H   Continuous Infusions: . cefTRIAXone (ROCEPHIN)  IV 2 g (07/16/19 0925)  . vancomycin 1,500 mg (07/16/19 1003)     LOS: 1 day     Cordelia Poche, MD [facility] 07/16/2019, 11:15 AM  If  7PM-7AM, please contact night-coverage [email or web address]

## 2019-07-16 NOTE — Evaluation (Addendum)
Physical Therapy Evaluation Patient Details Name: Lindsay Little MRN: [id number] DOB: November 01, 1965 Today's Date: 07/16/2019   History of Present Illness  54yo female with history of discitis with symptoms since October 2020, getting progressively worse; she apparently had a PICC placed but did not use it due to high copays for the medications. Hypoxic in ED. MRI shows L3-L4 discitis osteomyelitis with progressive perivertebral inflammation and high grade thecal sac narrowing at L3-L4. PMH rectal CA, chemo induced neuropathy, colostomy placement, HTN, IBS, HLD, HTN, obesity, hx rotator cuff tear, lumbar surgery 2019, colon resection, knee arthroscopy, hernia repair  Clinical Impression   Patient received in bed, declines EOB/OOB due to severe pain levels but willing to participate in bed level evaluation. Ankle dorsiflexion, hip flexion/quad, and hip abduction grossly 4+/5 even with pain being as high as it is, knee and hip ROM somewhat limited today due to pain but anticipate this will improve as pain goes down. Session limited due to pain- anticipate she will progress well with pain levels are improved by medical team. She was left in bed with all needs met, bed alarm active and father present. Currently recommend HHPT and 24/7A given her prior level of independence, however will update recommendations if necessary as she is able to tolerate more mobility.     Follow Up Recommendations Home health PT;Supervision/Assistance - 24 hour;Other (comment)(tentatively planning for HHPT given prior level of mobility; will update recommendations if appropriate/necessary  as she progresses)    Equipment Recommendations  Other (comment)(TBD)    Recommendations for Other Services       Precautions / Restrictions Precautions Precautions: Fall;Back;Other (comment) Precaution Booklet Issued: No Precaution Comments: severe back pain Restrictions Weight Bearing Restrictions: No      Mobility  Bed  Mobility               General bed mobility comments: Declined to perform bed mobility  Transfers                 General transfer comment: deferred- pain  Ambulation/Gait             General Gait Details: deferred- pain  Stairs            Wheelchair Mobility    Modified Rankin (Stroke Patients Only)       Balance                                             Pertinent Vitals/Pain Pain Assessment: 0-10 Pain Score: 10-Worst pain ever Pain Location: back and hips with movement Pain Descriptors / Indicators: Radiating;Sharp;Squeezing;Stabbing;Grimacing;Moaning Pain Intervention(s): Limited activity within patient's tolerance;Monitored during session    [place] expects to be discharged to:: Private residence Living Arrangements: Spouse/significant other;Children(18 yo with CP and 49 yo) Available Help at Discharge: Family;Available 24 hours/day Type of Home: House Home Access: Stairs to enter;Ramped entrance(Ramp in back) Entrance Stairs-Rails: Right;Left;Can reach both Entrance Stairs-Number of Steps: 4 Home Layout: One level Home Equipment: Clinical cytogeneticist - 2 wheels;Grab bars - tub/shower;Electric scooter;Adaptive equipment      Prior Function Level of Independence: Independent         Comments: Last two weeks has needed  help with BADLs including LB ADLs. Using AE for LB ADLs and husband assisting     Hand Dominance        Extremity/Trunk Assessment   Upper Extremity  Assessment Upper Extremity Assessment: Defer to OT evaluation    Lower Extremity Assessment Lower Extremity Assessment: Overall WFL for tasks assessed    Cervical / Trunk Assessment Cervical / Trunk Assessment: Other exceptions Cervical / Trunk Exceptions: back surgery 2019  Communication   Communication: No difficulties  Cognition Arousal/Alertness: Awake/alert Behavior During Therapy: WFL for tasks  assessed/performed Overall Cognitive Status: Within Functional Limits for tasks assessed                                        General Comments General comments (skin integrity, edema, etc.): did not get to EOB today- pain    Exercises     Assessment/Plan    PT Assessment Patient needs continued PT services  PT Problem List Obesity;Decreased knowledge of use of DME;Decreased activity tolerance;Decreased safety awareness;Pain;Decreased knowledge of precautions;Decreased mobility       PT Treatment Interventions DME instruction;Balance training;Gait training;Neuromuscular re-education;Stair training;Functional mobility training;Patient/family education;Therapeutic activities;Therapeutic exercise    PT Goals (Current goals can be found in the Care Plan section)  Acute Rehab PT Goals Patient Stated Goal: less pain PT Goal Formulation: With patient/family Time For Goal Achievement: 07/30/19 Potential to Achieve Goals: Good    Frequency Min 3X/week   Barriers to discharge        Co-evaluation PT/OT/SLP Co-Evaluation/Treatment: Yes Reason for Co-Treatment: Other (comment)(high pain levels) PT goals addressed during session: Strengthening/ROM         AM-PAC PT "6 Clicks" Mobility  Outcome Measure Help needed turning from your back to your side while in a flat bed without using bedrails?: A Lot Help needed moving from lying on your back to sitting on the side of a flat bed without using bedrails?: A Lot Help needed moving to and from a bed to a chair (including a wheelchair)?: Total Help needed standing up from a chair using your arms (e.g., wheelchair or bedside chair)?: Total Help needed to walk in hospital room?: Total Help needed climbing 3-5 steps with a railing? : Total 6 Click Score: 8    End of Session   Activity Tolerance: Patient limited by pain Patient left: in bed;with call bell/phone within reach;with bed alarm set;with family/visitor  present   PT Visit Diagnosis: Pain;Difficulty in walking, not elsewhere classified (R26.2) Pain - Right/Left: (bilateral) Pain - part of body: (back)    Time: 3785-8850 PT Time Calculation (min) (ACUTE ONLY): 17 min   Charges:   PT Evaluation $PT Eval Moderate Complexity: 1 Mod          Windell Norfolk, DPT, PN1   Supplemental Physical Therapist [place]    Pager [phone] Acute Rehab Office [phone]

## 2019-07-17 MED ORDER — GABAPENTIN 300 MG PO CAPS
900.0000 mg | ORAL_CAPSULE | Freq: Once | ORAL | Status: AC
  Administered 2019-07-17: 900 mg via ORAL
  Filled 2019-07-17: qty 3

## 2019-07-17 NOTE — Progress Notes (Signed)
PT Cancellation Note  Patient Details Name: Lindsay Little MRN: [id number] DOB: 04-18-1966   Cancelled Treatment:    Reason Eval/Treat Not Completed: Patient declined, no reason specified;Pain limiting ability to participate. Pt stated she would not mobilize until Monday. When questioned why Monday, she stated that was what her MD told her. Advised pt that PT had been ordered with a start date of Friday 07/16/2019 and that non of her MD notes indicate she should be on bed rest right now. Educated pt on the benefits of therapy. She continues to decline, stating the pain is too much. Will check back as time allows.   Benjiman Core, PTA Pager [phone] Acute Rehab  Allena Katz 07/17/2019, 3:12 PM

## 2019-07-17 NOTE — Progress Notes (Signed)
PROGRESS NOTE    [name]  KGU:[id number] DOB: 07-Jun-1966 DOA: 07/14/2019 PCP: [name], MD   Brief Narrative: Lindsay Little is a 54 y.o. female with medical history significant of discitis, rectal adenocarcinoma s/p radiation, s/p colostomy, chronic pain. Patient presented secondary to worsening back pain with leg weakness in setting of known L3-4 discitis.    Assessment & Plan:   Principal Problem:   Discitis Active Problems:   Pure hypercholesterolemia   Morbid obesity with body mass index of 40.0-49.9 (HCC)   Rectal adenocarcinoma s/p LAR resection 05/25/2015   Hypertension   Anemia of chronic disease   Chronic pain syndrome   Discitis Involving L3-4. Patient was being treated for discitis with oral antibiotics as an outpatient with worsening symptoms now requiring IV antibiotics. Neurosurgery consulted and recommended no surgical management. Blood cultures (12/31) no growth to date. Afebrile. No leukocytosis. CRP and ESR significantly elevated on admission.. Patient with severe pain. Started on PCA pump with mild improvement -ID recommendations: Vancomycin and Ceftriaxone x 6 weeks -Continue dilaudid PCA, home morphine IR, naloxone prn for overdose -Continue oxygen via [place] while on PCA pump -Palliative care consulted for pain management -PT/OT  Chronic pain syndrome Patient is on morphine IR q6 hours as an outpatient. Pain management as mentioned above. -Continue Cymbalta  Essential hypertension Patient is on Bystolic as an outpatient. -Continue Bystolic  Hyperlipidemia -Continue Crestor  History of rectal adenocarcinoma S/p LAR resection, permanent colostomy.  Morbid obesity Body mass index is 47.4 kg/m.   DVT prophylaxis: Lovenox Code Status:   Code Status: Full Code Family Communication: None Disposition Plan: Discharge pending transition to oral pain regimen and ability to receive IV antibiotics as an outpatient   Consultants:    Infectious disease  Palliative care medicine  Procedures:   None  Antimicrobials:  Vancomycin  Ceftriaxone    Subjective: Pain still present. Somewhat controlled but unable to participate in PT secondary to pain.  Objective: Vitals:   07/17/19 0021 07/17/19 0418 07/17/19 0830 07/17/19 0929  BP: (!) 139/55 134/66  102/73  Pulse: 71 70  65  Resp: 14 15 18 18   Temp: 98.6 F (37 C) 98.4 F (36.9 C)  97.8 F (36.6 C)  TempSrc: Oral Oral  Oral  SpO2: 98% 100% 94% 97%  Weight:      Height:        Intake/Output Summary (Last 24 hours) at 07/17/2019 1200 Last data filed at 07/17/2019 0934 Gross per 24 hour  Intake 700 ml  Output 1250 ml  Net -550 ml   Filed Weights   07/14/19 1809  Weight: (!) 152 kg    Examination:  General exam: Appears calm and comfortable  Respiratory system: Clear to auscultation. Respiratory effort normal. Cardiovascular system: S1 & S2 heard, RRR. Gastrointestinal system: Abdomen is nondistended, soft and nontender. No organomegaly or masses felt. Normal bowel sounds heard. Central nervous system: Alert and oriented. No focal neurological deficits. Extremities: No edema. No calf tenderness Skin: No cyanosis. No rashes Psychiatry: Judgement and insight appear normal. Mood & affect appropriate.    Data Reviewed: I have personally reviewed following labs and imaging studies  CBC: Recent Labs  Lab 07/15/19 0154 07/16/19 0219  WBC 9.6 8.1  NEUTROABS 8.3*  --   HGB 11.3* 11.1*  HCT 35.8* 35.9*  MCV 86.9 88.9  PLT 313 628   Basic Metabolic Panel: Recent Labs  Lab 07/15/19 0154 07/16/19 0219  NA 138 137  K 3.9 3.9  CL  101 101  CO2 25 27  GLUCOSE 119* 100*  BUN 6 9  CREATININE 0.52 0.60  CALCIUM 9.9 9.6   GFR: Estimated Creatinine Clearance: 133.2 mL/min (by C-G formula based on SCr of 0.6 mg/dL). Liver Function Tests: Recent Labs  Lab 07/15/19 0154  AST 12*  ALT 11  ALKPHOS 75  BILITOT 0.8  PROT 7.1  ALBUMIN 3.4*    No results for input(s): LIPASE, AMYLASE in the last 168 hours. No results for input(s): AMMONIA in the last 168 hours. Coagulation Profile: No results for input(s): INR, PROTIME in the last 168 hours. Cardiac Enzymes: No results for input(s): CKTOTAL, CKMB, CKMBINDEX, TROPONINI in the last 168 hours. BNP (last 3 results) No results for input(s): PROBNP in the last 8760 hours. HbA1C: No results for input(s): HGBA1C in the last 72 hours. CBG: No results for input(s): GLUCAP in the last 168 hours. Lipid Profile: No results for input(s): CHOL, HDL, LDLCALC, TRIG, CHOLHDL, LDLDIRECT in the last 72 hours. Thyroid Function Tests: No results for input(s): TSH, T4TOTAL, FREET4, T3FREE, THYROIDAB in the last 72 hours. Anemia Panel: No results for input(s): VITAMINB12, FOLATE, FERRITIN, TIBC, IRON, RETICCTPCT in the last 72 hours. Sepsis Labs: No results for input(s): PROCALCITON, LATICACIDVEN in the last 168 hours.  Recent Results (from the past 240 hour(s))  Blood culture (routine x 2)     Status: None (Preliminary result)   Collection Time: 07/15/19  1:45 AM   Specimen: BLOOD  Result Value Ref Range Status   Specimen Description BLOOD RIGHT ARM  Final   Special Requests   Final    BOTTLES DRAWN AEROBIC AND ANAEROBIC Blood Culture results may not be optimal due to an excessive volume of blood received in culture bottles   Culture   Final    NO GROWTH 2 DAYS Performed at [facility], [place] [address] [place], [place] [address]    Report Status PENDING  Incomplete  Blood culture (routine x 2)     Status: None (Preliminary result)   Collection Time: 07/15/19  1:51 AM   Specimen: BLOOD  Result Value Ref Range Status   Specimen Description BLOOD LEFT HAND  Final   Special Requests   Final    BOTTLES DRAWN AEROBIC ONLY Blood Culture adequate volume   Culture   Final    NO GROWTH 2 DAYS Performed at [facility], [place] [address], [place], [place] [address]    Report  Status PENDING  Incomplete  SARS CORONAVIRUS 2 (TAT 6-24 HRS) Nasopharyngeal Nasopharyngeal Swab     Status: None   Collection Time: 07/15/19  7:02 AM   Specimen: Nasopharyngeal Swab  Result Value Ref Range Status   SARS Coronavirus 2 NEGATIVE NEGATIVE Final    Comment: (NOTE) SARS-CoV-2 target nucleic acids are NOT DETECTED. The SARS-CoV-2 RNA is generally detectable in upper and lower respiratory specimens during the acute phase of infection. Negative results do not preclude SARS-CoV-2 infection, do not rule out co-infections with other pathogens, and should not be used as the sole basis for treatment or other patient management decisions. Negative results must be combined with clinical observations, patient history, and epidemiological information. The expected result is Negative. Fact Sheet for Patients: SugarRoll.be Fact Sheet for Healthcare Providers: https://www.woods-mathews.com/ This test is not yet approved or cleared by the [place] FDA and  has been authorized for detection and/or diagnosis of SARS-CoV-2 by FDA under an Emergency Use Authorization (EUA). This EUA will remain  in effect (  meaning this test can be used) for the duration of the COVID-19 declaration under Section 56 4(b)(1) of the Act, 21 U.S.C. section 360bbb-3(b)(1), unless the authorization is terminated or revoked sooner. Performed at [facility], [place] [address] [place], [place] [address]   Urine culture     Status: None   Collection Time: 07/15/19  8:45 AM   Specimen: Urine, Clean Catch  Result Value Ref Range Status   Specimen Description URINE, CLEAN CATCH  Final   Special Requests NONE  Final   Culture   Final    NO GROWTH Performed at [facility], [place] [address] [place], [place] [address]    Report Status 07/16/2019 FINAL  Final         Radiology Studies: No results found.      Scheduled Meds: . DULoxetine  120 mg Oral  QHS  . enoxaparin (LOVENOX) injection  0.5 mg/kg Subcutaneous Q24H  . HYDROmorphone   Intravenous Q4H  . Melatonin  6 mg Oral QHS  . nebivolol  10 mg Oral QHS  . pantoprazole  40 mg Oral BID  . rosuvastatin  5 mg Oral QHS  . senna-docusate  1 tablet Oral BID  . sodium chloride flush  3 mL Intravenous Q12H   Continuous Infusions: . cefTRIAXone (ROCEPHIN)  IV 2 g (07/17/19 0829)  . vancomycin 1,500 mg (07/17/19 1039)     LOS: 2 days     Cordelia Poche, MD [facility] 07/17/2019, 12:00 PM  If 7PM-7AM, please contact night-coverage [email or web address]

## 2019-07-18 LAB — VANCOMYCIN, TROUGH: Vancomycin Tr: 14 ug/mL — ABNORMAL LOW (ref 15–20)

## 2019-07-18 LAB — VANCOMYCIN, PEAK: Vancomycin Pk: 33 ug/mL (ref 30–40)

## 2019-07-18 MED ORDER — VANCOMYCIN HCL 1250 MG/250ML IV SOLN
1250.0000 mg | Freq: Two times a day (BID) | INTRAVENOUS | Status: DC
  Administered 2019-07-19 – 2019-07-21 (×5): 1250 mg via INTRAVENOUS
  Filled 2019-07-18 (×6): qty 250

## 2019-07-18 NOTE — Plan of Care (Signed)
Patient progressing towards plan of care goals.

## 2019-07-18 NOTE — Progress Notes (Signed)
Pharmacy Antibiotic Note  Lindsay Little is a 54 y.o. female admitted on 07/14/2019 with osteomyelitis.  Pharmacy has been consulted for Vancomycin dosing. End date noted as 08/25/19 -cultures- ngtd -vancomycin dose given at 10:15am, vancomycin peak was 33 (on 1/3 at 2:30am) and trough was 15 at 9:30pm -Estimated AUC= 636  Plan: -change vancomycin to 1243m IV (estimated AUC= 528) -Recheck levels at steady state   Height: 5' 10.5" (179.1 cm) Weight: (!) 335 lb 1.6 oz (152 kg) IBW/kg (Calculated) : 69.65  Temp (24hrs), Avg:98.1 F (36.7 C), Min:97.8 F (36.6 C), Max:98.3 F (36.8 C)  Recent Labs  Lab 07/15/19 0154 07/16/19 0219 07/18/19 0154 07/18/19 2132  WBC 9.6 8.1  --   --   CREATININE 0.52 0.60  --   --   VANCOTROUGH  --   --   --  14*  VANCOPEAK  --   --  33  --     Estimated Creatinine Clearance: 133.2 mL/min (by C-G formula based on SCr of 0.6 mg/dL).    Allergies  Allergen Reactions  . Caine-1 [Lidocaine] Swelling and Rash    Eyes swell shut; includes all caine drugs except marcaine. EMLA cream OK though (?!)  . Sulfa Antibiotics Nausea And Vomiting and Rash  . Adhesive [Tape] Other (See Comments)    Blisters - can use paper tape  . Doxycycline Nausea Only  . Flagyl [Metronidazole] Nausea Only  . Iron Nausea Only  . Oxycodone Other (See Comments)    NIGHTMARES. (tolerates hydrocodone or tramadol better)  . Penicillins Rash    Has patient had a PCN reaction causing immediate rash, facial/tongue/throat swelling, SOB or lightheadedness with hypotension: no Has patient had a PCN reaction causing severe rash involving mucus membranes or skin necrosis: no Has patient had a PCN reaction that required hospitalization no Has patient had a PCN reaction occurring within the last 10 years: no If all of the above answers are "NO", then may proceed with Cephalosporin use.     Antimicrobials this admission: Vanc 12/31 >>  Rocephin 12/31 >>  Thank you for  allowing pharmacy to be a part of this patient's care.  AHildred Laser PharmD Clinical Pharmacist **Pharmacist phone directory can now be found on [email or web address] ([email or web address]).  Listed under [place]

## 2019-07-18 NOTE — Progress Notes (Signed)
PROGRESS NOTE    Lindsay Little  XKG:[id number] DOB: September 14, 1965 DOA: 07/14/2019 PCP: [name], MD     Brief Narrative:  MAKYNLIE Little is a 54 y.o. femalewith medical history significant ofdiscitis, rectal adenocarcinomas/pradiation,s/pcolostomy, chronic pain. Patient presented secondary to worsening back pain with leg weakness in setting of known L3-4 discitis. Neurosurgery consulted and recommended no surgical management.   New events last 24 hours / Subjective: Remains on PCA pump with well controlled pain. Some nausea without vomiting, tolerated breakfast although did not like the food. No CP, SOB.   Assessment & Plan:   Principal Problem:   Discitis Active Problems:   Pure hypercholesterolemia   Morbid obesity with body mass index of 40.0-49.9 (HCC)   Rectal adenocarcinoma s/p LAR resection 05/25/2015   Hypertension   Anemia of chronic disease   Chronic pain syndrome    L3-L4 Discitis -Patient was being treated for discitis with oral antibiotics as an outpatient with worsening symptoms now requiring IV antibiotics. Neurosurgery consulted and recommended no surgical management -Blood cultures (12/31) no growth to date -ID recommendations: Vancomycin and Ceftriaxone x 6 weeks  -Follow up in ID office 08/03/2019  -Continue dilaudid PCA, home morphine IR, naloxone prn for overdose -Palliative care consulted for pain management -PT/OT pending   Chronic pain syndrome -Patient is on morphine IR q6 hours as an outpatient -Continue Cymbalta  Essential hypertension -Continue Bystolic  Hyperlipidemia -Continue Crestor  History of rectal adenocarcinoma -S/p LAR resection, permanent colostomy  Morbid obesity -Estimated body mass index is 47.4 kg/m as calculated from the following:   Height as of this encounter: 5' 10.5" (1.791 m).   Weight as of this encounter: 152 kg.    DVT prophylaxis: Lovenox Code Status: Full Family Communication: None at  bedside Disposition Plan: Pending transition of care consultation to assist with home IV antibiotic medication, cost issues, palliative care consult for pain management/symptom management   Consultants:   Palliative care  Infectious disease  Neurosurgery   Antimicrobials:  Anti-infectives (From admission, onward)   Start     Dose/Rate Route Frequency Ordered Stop   07/16/19 1000  vancomycin (VANCOREADY) IVPB 1500 mg/300 mL     1,500 mg 150 mL/hr over 120 Minutes Intravenous Every 12 hours 07/15/19 1932     07/15/19 0915  vancomycin (VANCOREADY) IVPB 2000 mg/400 mL  Status:  Discontinued     2,000 mg 200 mL/hr over 120 Minutes Intravenous Every 8 hours 07/15/19 0902 07/15/19 1932   07/15/19 0900  vancomycin (VANCOREADY) IVPB 1500 mg/300 mL  Status:  Discontinued     1,500 mg 150 mL/hr over 120 Minutes Intravenous  Once 07/15/19 0848 07/15/19 0902   07/15/19 0900  cefTRIAXone (ROCEPHIN) 2 g in sodium chloride 0.9 % 100 mL IVPB     2 g 200 mL/hr over 30 Minutes Intravenous Every 24 hours 07/15/19 0848          Objective: Vitals:   07/18/19 0513 07/18/19 0656 07/18/19 0809 07/18/19 0830  BP: (!) 112/48   (!) 130/42  Pulse: 65   65  Resp: 16 14 18 20   Temp: 97.9 F (36.6 C)   98 F (36.7 C)  TempSrc: Oral   Oral  SpO2: 97% 92% 97% 100%  Weight:      Height:        Intake/Output Summary (Last 24 hours) at 07/18/2019 1058 Last data filed at 07/18/2019 1015 Gross per 24 hour  Intake 800 ml  Output 1575 ml  Net -775  ml   Filed Weights   07/14/19 1809  Weight: (!) 152 kg    Examination:  General exam: Appears calm and comfortable  Respiratory system: Clear to auscultation. Respiratory effort normal. No respiratory distress. No conversational dyspnea.  Cardiovascular system: S1 & S2 heard, RRR. No murmurs. No pedal edema. Gastrointestinal system: Abdomen is nondistended, soft and nontender. Normal bowel sounds heard. Central nervous system: Alert and oriented. No  focal neurological deficits. Speech clear.  Extremities: Symmetric in appearance  Skin: No rashes, lesions or ulcers on exposed skin  Psychiatry: Judgement and insight appear normal. Mood & affect appropriate.   Data Reviewed: I have personally reviewed following labs and imaging studies  CBC: Recent Labs  Lab 07/15/19 0154 07/16/19 0219  WBC 9.6 8.1  NEUTROABS 8.3*  --   HGB 11.3* 11.1*  HCT 35.8* 35.9*  MCV 86.9 88.9  PLT 313 735   Basic Metabolic Panel: Recent Labs  Lab 07/15/19 0154 07/16/19 0219  NA 138 137  K 3.9 3.9  CL 101 101  CO2 25 27  GLUCOSE 119* 100*  BUN 6 9  CREATININE 0.52 0.60  CALCIUM 9.9 9.6   GFR: Estimated Creatinine Clearance: 133.2 mL/min (by C-G formula based on SCr of 0.6 mg/dL). Liver Function Tests: Recent Labs  Lab 07/15/19 0154  AST 12*  ALT 11  ALKPHOS 75  BILITOT 0.8  PROT 7.1  ALBUMIN 3.4*   No results for input(s): LIPASE, AMYLASE in the last 168 hours. No results for input(s): AMMONIA in the last 168 hours. Coagulation Profile: No results for input(s): INR, PROTIME in the last 168 hours. Cardiac Enzymes: No results for input(s): CKTOTAL, CKMB, CKMBINDEX, TROPONINI in the last 168 hours. BNP (last 3 results) No results for input(s): PROBNP in the last 8760 hours. HbA1C: No results for input(s): HGBA1C in the last 72 hours. CBG: No results for input(s): GLUCAP in the last 168 hours. Lipid Profile: No results for input(s): CHOL, HDL, LDLCALC, TRIG, CHOLHDL, LDLDIRECT in the last 72 hours. Thyroid Function Tests: No results for input(s): TSH, T4TOTAL, FREET4, T3FREE, THYROIDAB in the last 72 hours. Anemia Panel: No results for input(s): VITAMINB12, FOLATE, FERRITIN, TIBC, IRON, RETICCTPCT in the last 72 hours. Sepsis Labs: No results for input(s): PROCALCITON, LATICACIDVEN in the last 168 hours.  Recent Results (from the past 240 hour(s))  Blood culture (routine x 2)     Status: None (Preliminary result)   Collection  Time: 07/15/19  1:45 AM   Specimen: BLOOD  Result Value Ref Range Status   Specimen Description BLOOD RIGHT ARM  Final   Special Requests   Final    BOTTLES DRAWN AEROBIC AND ANAEROBIC Blood Culture results may not be optimal due to an excessive volume of blood received in culture bottles   Culture   Final    NO GROWTH 3 DAYS Performed at [facility], [place] [address], [place], [place] [address]    Report Status PENDING  Incomplete  Blood culture (routine x 2)     Status: None (Preliminary result)   Collection Time: 07/15/19  1:51 AM   Specimen: BLOOD  Result Value Ref Range Status   Specimen Description BLOOD LEFT HAND  Final   Special Requests   Final    BOTTLES DRAWN AEROBIC ONLY Blood Culture adequate volume   Culture   Final    NO GROWTH 3 DAYS Performed at [facility], [place] [address] [place], [place] [address]    Report Status  PENDING  Incomplete  SARS CORONAVIRUS 2 (TAT 6-24 HRS) Nasopharyngeal Nasopharyngeal Swab     Status: None   Collection Time: 07/15/19  7:02 AM   Specimen: Nasopharyngeal Swab  Result Value Ref Range Status   SARS Coronavirus 2 NEGATIVE NEGATIVE Final    Comment: (NOTE) SARS-CoV-2 target nucleic acids are NOT DETECTED. The SARS-CoV-2 RNA is generally detectable in upper and lower respiratory specimens during the acute phase of infection. Negative results do not preclude SARS-CoV-2 infection, do not rule out co-infections with other pathogens, and should not be used as the sole basis for treatment or other patient management decisions. Negative results must be combined with clinical observations, patient history, and epidemiological information. The expected result is Negative. Fact Sheet for Patients: SugarRoll.be Fact Sheet for Healthcare Providers: https://www.woods-mathews.com/ This test is not yet approved or cleared by the [place] FDA and  has been authorized for detection and/or  diagnosis of SARS-CoV-2 by FDA under an Emergency Use Authorization (EUA). This EUA will remain  in effect (meaning this test can be used) for the duration of the COVID-19 declaration under Section 56 4(b)(1) of the Act, 21 U.S.C. section 360bbb-3(b)(1), unless the authorization is terminated or revoked sooner. Performed at [facility], [place] [address] [place], [place] [address]   Urine culture     Status: None   Collection Time: 07/15/19  8:45 AM   Specimen: Urine, Clean Catch  Result Value Ref Range Status   Specimen Description URINE, CLEAN CATCH  Final   Special Requests NONE  Final   Culture   Final    NO GROWTH Performed at [facility], [place] [address] [place], [place] [address]    Report Status 07/16/2019 FINAL  Final      Radiology Studies: No results found.    Scheduled Meds: . DULoxetine  120 mg Oral QHS  . enoxaparin (LOVENOX) injection  0.5 mg/kg Subcutaneous Q24H  . HYDROmorphone   Intravenous Q4H  . Melatonin  6 mg Oral QHS  . nebivolol  10 mg Oral QHS  . pantoprazole  40 mg Oral BID  . rosuvastatin  5 mg Oral QHS  . senna-docusate  1 tablet Oral BID  . sodium chloride flush  3 mL Intravenous Q12H   Continuous Infusions: . cefTRIAXone (ROCEPHIN)  IV 2 g (07/17/19 0829)  . vancomycin 1,500 mg (07/18/19 1015)     LOS: 3 days      Time spent: 35 minutes   Dessa Phi, DO [facility] 07/18/2019, 10:58 AM   Available via Epic secure chat 7am-7pm After these hours, please refer to coverage provider listed on [email or web address]

## 2019-07-19 ENCOUNTER — Inpatient Hospital Stay: Payer: Self-pay

## 2019-07-19 DIAGNOSIS — C20 Malignant neoplasm of rectum: Secondary | ICD-10-CM

## 2019-07-19 DIAGNOSIS — D638 Anemia in other chronic diseases classified elsewhere: Secondary | ICD-10-CM

## 2019-07-19 DIAGNOSIS — Z515 Encounter for palliative care: Secondary | ICD-10-CM

## 2019-07-19 LAB — HEPATITIS C ANTIBODY (REFLEX): HCV Ab: 0.2 s/co ratio (ref 0.0–0.9)

## 2019-07-19 LAB — BASIC METABOLIC PANEL
Anion gap: 10 (ref 5–15)
BUN: 12 mg/dL (ref 6–20)
CO2: 30 mmol/L (ref 22–32)
Calcium: 9.6 mg/dL (ref 8.9–10.3)
Chloride: 100 mmol/L (ref 98–111)
Creatinine, Ser: 0.47 mg/dL (ref 0.44–1.00)
GFR calc Af Amer: >60 mL/min (ref >60)
GFR calc non Af Amer: >60 mL/min (ref >60)
Glucose, Bld: 96 mg/dL (ref 70–99)
Potassium: 4.2 mmol/L (ref 3.5–5.1)
Sodium: 140 mmol/L (ref 135–145)

## 2019-07-19 LAB — HCV COMMENT:

## 2019-07-19 MED ORDER — CELECOXIB 200 MG PO CAPS
200.0000 mg | ORAL_CAPSULE | Freq: Two times a day (BID) | ORAL | Status: DC
  Administered 2019-07-19 – 2019-07-21 (×4): 200 mg via ORAL
  Filled 2019-07-19 (×4): qty 1

## 2019-07-19 MED ORDER — GABAPENTIN 400 MG PO CAPS
800.0000 mg | ORAL_CAPSULE | Freq: Four times a day (QID) | ORAL | Status: DC
  Administered 2019-07-19 – 2019-07-21 (×7): 800 mg via ORAL
  Filled 2019-07-19 (×8): qty 2

## 2019-07-19 MED ORDER — CEFTRIAXONE IV (FOR PTA / DISCHARGE USE ONLY): 2.0000 g | INTRAVENOUS | 0 refills | Status: AC

## 2019-07-19 MED ORDER — VANCOMYCIN IV (FOR PTA / DISCHARGE USE ONLY): 1250.0000 mg | Freq: Two times a day (BID) | INTRAVENOUS | 0 refills | Status: AC

## 2019-07-19 MED ORDER — FENTANYL 100 MCG/HR TD PT72
1.0000 | MEDICATED_PATCH | TRANSDERMAL | Status: DC
  Administered 2019-07-19: 1 via TRANSDERMAL
  Filled 2019-07-19: qty 1

## 2019-07-19 NOTE — Progress Notes (Signed)
Occupational Therapy Treatment Patient Details Name: Lindsay Little MRN: [id number] DOB: 28-May-1966 Today's Date: 07/19/2019    History of present illness 54yo female with history of discitis with symptoms since October 2020, getting progressively worse; she apparently had a PICC placed but did not use it due to high copays for the medications. Hypoxic in ED. MRI shows L3-L4 discitis osteomyelitis with progressive perivertebral inflammation and high grade thecal sac narrowing at L3-L4. PMH rectal CA, chemo induced neuropathy, colostomy placement, HTN, IBS, HLD, HTN, obesity, hx rotator cuff tear, lumbar surgery 2019, colon resection, knee arthroscopy, hernia repair   OT comments  Pt agreeable to co-tx with OT and PT this session but continues to be limited by pain. Pt reports 8/10 pain in back increasing to 10/10 in back and radiating down hips and LEs with mobility. Pt performs supine with >sit with supervision to EOB and sitting EOB for ~4 minutes but pt reports increased pain and spasms at this time. She did not feel she was able to stand with therapy but did perform lateral scoots to L towards HOB with min guard. Mod A for B LEs when returning to supine. Pt repositioning herself in bed to her level of comfort. Pt continues to benefit from OT intervention. HHOT at discharge continues to be appropriate.  Follow Up Recommendations  Home health OT;Supervision/Assistance - 24 hour    Equipment Recommendations  Other (comment);3 in 1 bedside commode       Precautions / Restrictions Precautions Precautions: Fall;Back;Other (comment) Precaution Booklet Issued: No Precaution Comments: severe back pain Restrictions Weight Bearing Restrictions: No       Mobility Bed Mobility Overal bed mobility: Needs Assistance Bed Mobility: Supine to Sit;Sit to Supine;Rolling Rolling: Supervision   Supine to sit: Supervision Sit to supine: Mod assist   General bed mobility comments: assist for line  management, pt uses bedrails and HOB elevated to come to sitting EOB with supervision. Sit>supine with mod assist for LE elevation.  Transfers Overall transfer level: Needs assistance   Transfers: Lateral/Scoot Transfers          Lateral/Scoot Transfers: Min guard General transfer comment: pt performed lateral scooting towards the [facility] today with min guard assist, pt able to push through LEs and clear hips from bed during scooting. Unable to progress to standing activity today secondary to pain.    Balance Overall balance assessment: Needs assistance Sitting-balance support: Feet supported Sitting balance-Leahy Scale: Good Sitting balance - Comments: sitting balance EOB today x 5 min, supervision        ADL either performed or assessed with clinical judgement   ADL Overall ADL's : Needs assistance/impaired      Toilet Transfer: Total assistance Toilet Transfer Details (indicate cue type and reason): total A to don socks secondary to increased pain.         Vision Baseline Vision/History: Wears glasses Wears Glasses: At all times Patient Visual Report: No change from baseline            Cognition Arousal/Alertness: Awake/alert Behavior During Therapy: WFL for tasks assessed/performed Overall Cognitive Status: Within Functional Limits for tasks assessed      General Comments: Pt tearful today secondary to pain, frustrated with staff and frustrated that she does not know why she has an infection              General Comments tearful throughout session, therapist's providing emotional support and education    Pertinent Vitals/ Pain       Pain Assessment:  0-10 Pain Score: 8  Pain Location: back and hips with movement Pain Descriptors / Indicators: Radiating;Sharp;Squeezing;Stabbing;Grimacing;Moaning Pain Intervention(s): Limited activity within patient's tolerance;Monitored during session;Repositioned         Frequency  Min 3X/week        Progress  Toward Goals  OT Goals(current goals can now be found in the care plan section)  Progress towards OT goals: Progressing toward goals  Acute Rehab OT Goals Patient Stated Goal: "  I don't understand why I have the infection and i need this pain to be under control" OT Goal Formulation: With patient Time For Goal Achievement: 07/30/19 Potential to Achieve Goals: Good  Plan Discharge plan remains appropriate    Co-evaluation    PT/OT/SLP Co-Evaluation/Treatment: Yes Reason for Co-Treatment: For patient/therapist safety;To address functional/ADL transfers PT goals addressed during session: Mobility/safety with mobility OT goals addressed during session: Other (comment)(functional mobility)      AM-PAC OT "6 Clicks" Daily Activity     Outcome Measure   Help from another person eating meals?: A Little Help from another person taking care of personal grooming?: A Little Help from another person toileting, which includes using toliet, bedpan, or urinal?: Total Help from another person bathing (including washing, rinsing, drying)?: Total Help from another person to put on and taking off regular upper body clothing?: A Lot Help from another person to put on and taking off regular lower body clothing?: Total 6 Click Score: 11    End of Session    OT Visit Diagnosis: Unsteadiness on feet (R26.81);Other abnormalities of gait and mobility (R26.89);Muscle weakness (generalized) (M62.81);Pain Pain - part of body: (back and hip)   Activity Tolerance Patient limited by pain   Patient Left in bed;with call bell/phone within reach;with family/visitor present   Nurse Communication Mobility status        Time: 7616-0737 OT Time Calculation (min): 28 min  Charges: OT General Charges $OT Visit: 1 Visit OT Treatments $Therapeutic Activity: 8-22 mins   Gypsy Decant MS, OTR/L 07/19/2019, 12:33 PM

## 2019-07-19 NOTE — TOC Initial Note (Signed)
Transition of Care [facility]) - Initial/Assessment Note    Patient Details  Name: Lindsay Little MRN: [id number] Date of Birth: 04-19-1966  Transition of [facility]) CM/SW Contact:    Pollie Friar, RN Phone Number: 07/19/2019, 3:38 PM  Clinical Narrative:                 Jeannene Patella with [employer] has worked to find that patient will have an out of pocket co pay of about $30/ day for her IV meds.  CM was able to get [facility] to accept for [facility] PT/OT and [facility] will see for IV abx.  Awaiting PICC line and weaning of PCA.  TOC following.   Expected Discharge Plan: [place] Barriers to Discharge: Continued Medical Work up   Patient Goals and CMS Choice   CMS Medicare.gov Compare Post Acute Care list provided to:: Patient Choice offered to / list presented to : Patient  Expected Discharge Plan and Services Expected Discharge Plan: [place]   Discharge Planning Services: CM Consult Post Acute Care Choice: [place] arrangements for the past 2 months: Single Family Home                           HH Arranged: PT, OT, RN [facility] Agency: [place] Date [place]: 07/19/19   Representative spoke with at [place]: Tommi Rumps  Prior Living Arrangements/Services Living arrangements for the past 2 months: [place] Lives with:: Spouse Patient language and need for interpreter reviewed:: Yes Do you feel safe going back to the place where you live?: Yes      Need for Family Participation in Patient Care: Yes (Comment) Care giver support system in place?: Yes (comment)(spouse)   Criminal Activity/Legal Involvement Pertinent to Current Situation/Hospitalization: No - Comment as needed  Activities of Daily Living      Permission Sought/Granted                  Emotional Assessment Appearance:: Appears stated age Attitude/Demeanor/Rapport: Engaged Affect (typically observed): Accepting Orientation: : Oriented to  Self, Oriented to Place, Oriented to  Time, Oriented to Situation   Psych Involvement: No (comment)  Admission diagnosis:  Discitis of lumbar region [M46.46] Discitis [M46.40] Acute midline low back pain without sciatica [M54.5] Patient Active Problem List   Diagnosis Date Noted  . Discitis 07/15/2019  . Chronic pain syndrome 07/24/2018  . Parastomal hernia s/p lap repair w mesh 07/23/2018 07/23/2018  . 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  . 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  . Seroma of musculoskeletal structure after non-musculoskeletal system procedure 04/24/2018  . S/P lumbar spinal fusion 02/12/2018  . Genetic testing 01/12/2018  . Family history of pancreatic cancer   . Family history of skin cancer   . Family history of breast cancer   . Colostomy in place [facility]) 07/12/2016  . Rectocutaneous fistula 07/10/2016  . Hydroureter on right 12/20/2015  . Abdominal pain 12/19/2015  . Hypokalemia 11/23/2015  . Pelvic abscess s/p drainage & omental pedicle flap 11/17/2015 06/23/2015  . Anemia of chronic disease 06/14/2015  . Hypertension   . IBS (irritable bowel syndrome)   . Depression   . Cavernous hemangioma of liver - segments 5 & 7 02/07/2015  . Osteoarthritis of left knee 02/07/2015  . Crohn's disease ([place]) 02/07/2015  . Rectal adenocarcinoma s/p LAR resection 05/25/2015 01/31/2015  . Pure hypercholesterolemia 02/21/2014  . Morbid obesity with body mass index  of 40.0-49.9 ([place]) 02/21/2014  . Family history of ischemic heart disease 02/21/2014   PCP:  [name], MD Pharmacy:   [place], [place] - [address] [place] [address] Phone: [phone] Fax: [phone]     Social Determinants of Health (SDOH) Interventions    Readmission Risk Interventions No flowsheet data found.

## 2019-07-19 NOTE — Progress Notes (Signed)
PROGRESS NOTE    Lindsay Little  MBT:[id number] DOB: [birth year] DOA: 07/14/2019 PCP: [name], MD     Brief Narrative:  Lindsay Little is a 54 y.o. femalewith medical history significant ofdiscitis, rectal adenocarcinomas/pradiation,s/pcolostomy, chronic pain. Patient presented secondary to worsening back pain with leg weakness in setting of known L3-4 discitis. Neurosurgery consulted and recommended no surgical management.   New events last 24 hours / Subjective: Doing well overall, continues to have some spasms, has not been out of bed yet but will work with PT later today.  Remains on PCA pump this morning.  States that morphine has been prescribed by Dr. Burr Medico.  On chart review, appears that Dr. Burr Medico has referred patient to Dr. Maryjean Ka for pain management in mid December  Assessment & Plan:   Principal Problem:   Discitis Active Problems:   Pure hypercholesterolemia   Morbid obesity with body mass index of 40.0-49.9 (HCC)   Rectal adenocarcinoma s/p LAR resection 05/25/2015   Hypertension   Anemia of chronic disease   Chronic pain syndrome    L3-L4 Discitis -Patient was being treated for discitis with oral antibiotics as an outpatient with worsening symptoms now requiring IV antibiotics. Neurosurgery consulted and recommended no surgical management -Blood cultures (12/31) no growth to date -ID recommendations: Vancomycin and Ceftriaxone x 6 weeks  -Follow up in ID office 08/03/2019  -Continue dilaudid PCA, home morphine IR, naloxone prn for overdose -Palliative care consulted for pain management -PT/OT pending  -PICC line placement for home IV antibiotic  Chronic pain syndrome -Patient is on morphine IR q6 hours as an outpatient -Continue Cymbalta -Currently being managed by PCP as well as Dr. Burr Medico.  Outpatient referral to Dr. Maryjean Ka in process  Essential hypertension -Continue Bystolic  Hyperlipidemia -Continue Crestor  History of rectal  adenocarcinoma -S/p LAR resection, permanent colostomy  Morbid obesity -Estimated body mass index is 47.4 kg/m as calculated from the following:   Height as of this encounter: 5' 10.5" (1.791 m).   Weight as of this encounter: 152 kg.    DVT prophylaxis: Lovenox Code Status: Full Family Communication: None at bedside Disposition Plan: Pending transition of care consultation to assist with home IV antibiotic medication, cost issues, palliative care consult for pain management/symptom management.  PT OT evaluation pending.  PICC line placement ordered   Consultants:   Palliative care  Infectious disease  Neurosurgery   Antimicrobials:  Anti-infectives (From admission, onward)   Start     Dose/Rate Route Frequency Ordered Stop   07/19/19 1000  vancomycin (VANCOREADY) IVPB 1250 mg/250 mL     1,250 mg 166.7 mL/hr over 90 Minutes Intravenous Every 12 hours 07/18/19 2238     07/16/19 1000  vancomycin (VANCOREADY) IVPB 1500 mg/300 mL  Status:  Discontinued     1,500 mg 150 mL/hr over 120 Minutes Intravenous Every 12 hours 07/15/19 1932 07/18/19 2238   07/15/19 0915  vancomycin (VANCOREADY) IVPB 2000 mg/400 mL  Status:  Discontinued     2,000 mg 200 mL/hr over 120 Minutes Intravenous Every 8 hours 07/15/19 0902 07/15/19 1932   07/15/19 0900  vancomycin (VANCOREADY) IVPB 1500 mg/300 mL  Status:  Discontinued     1,500 mg 150 mL/hr over 120 Minutes Intravenous  Once 07/15/19 0848 07/15/19 0902   07/15/19 0900  cefTRIAXone (ROCEPHIN) 2 g in sodium chloride 0.9 % 100 mL IVPB     2 g 200 mL/hr over 30 Minutes Intravenous Every 24 hours 07/15/19 0848  Objective: Vitals:   07/19/19 0348 07/19/19 0521 07/19/19 0801 07/19/19 0808  BP: 129/90  (!) 151/69   Pulse: 76  65   Resp: 16 16 17 14   Temp: 98 F (36.7 C)  98.3 F (36.8 C)   TempSrc: Oral  Oral   SpO2: 100% 100% 96% 96%  Weight:      Height:        Intake/Output Summary (Last 24 hours) at 07/19/2019 1002 Last  data filed at 07/19/2019 0348 Gross per 24 hour  Intake 740 ml  Output 500 ml  Net 240 ml   Filed Weights   07/14/19 1809  Weight: (!) 152 kg    Examination: General exam: Appears calm and comfortable  Respiratory system: Clear to auscultation. Respiratory effort normal. Cardiovascular system: S1 & S2 heard, RRR. No pedal edema. Gastrointestinal system: Abdomen is nondistended, soft and nontender. Central nervous system: Alert and oriented. Non focal exam. Speech clear  Extremities: Symmetric in appearance bilaterally  Psychiatry: Judgement and insight appear stable. Mood & affect appropriate.    Data Reviewed: I have personally reviewed following labs and imaging studies  CBC: Recent Labs  Lab 07/15/19 0154 07/16/19 0219  WBC 9.6 8.1  NEUTROABS 8.3*  --   HGB 11.3* 11.1*  HCT 35.8* 35.9*  MCV 86.9 88.9  PLT 313 720   Basic Metabolic Panel: Recent Labs  Lab 07/15/19 0154 07/16/19 0219  NA 138 137  K 3.9 3.9  CL 101 101  CO2 25 27  GLUCOSE 119* 100*  BUN 6 9  CREATININE 0.52 0.60  CALCIUM 9.9 9.6   GFR: Estimated Creatinine Clearance: 133.2 mL/min (by C-G formula based on SCr of 0.6 mg/dL). Liver Function Tests: Recent Labs  Lab 07/15/19 0154  AST 12*  ALT 11  ALKPHOS 75  BILITOT 0.8  PROT 7.1  ALBUMIN 3.4*   No results for input(s): LIPASE, AMYLASE in the last 168 hours. No results for input(s): AMMONIA in the last 168 hours. Coagulation Profile: No results for input(s): INR, PROTIME in the last 168 hours. Cardiac Enzymes: No results for input(s): CKTOTAL, CKMB, CKMBINDEX, TROPONINI in the last 168 hours. BNP (last 3 results) No results for input(s): PROBNP in the last 8760 hours. HbA1C: No results for input(s): HGBA1C in the last 72 hours. CBG: No results for input(s): GLUCAP in the last 168 hours. Lipid Profile: No results for input(s): CHOL, HDL, LDLCALC, TRIG, CHOLHDL, LDLDIRECT in the last 72 hours. Thyroid Function Tests: No results for  input(s): TSH, T4TOTAL, FREET4, T3FREE, THYROIDAB in the last 72 hours. Anemia Panel: No results for input(s): VITAMINB12, FOLATE, FERRITIN, TIBC, IRON, RETICCTPCT in the last 72 hours. Sepsis Labs: No results for input(s): PROCALCITON, LATICACIDVEN in the last 168 hours.  Recent Results (from the past 240 hour(s))  Blood culture (routine x 2)     Status: None (Preliminary result)   Collection Time: 07/15/19  1:45 AM   Specimen: BLOOD  Result Value Ref Range Status   Specimen Description BLOOD RIGHT ARM  Final   Special Requests   Final    BOTTLES DRAWN AEROBIC AND ANAEROBIC Blood Culture results may not be optimal due to an excessive volume of blood received in culture bottles   Culture   Final    NO GROWTH 3 DAYS Performed at [facility], [place] [address], [place], [place] [address]    Report Status PENDING  Incomplete  Blood culture (routine x 2)     Status: None (Preliminary result)  Collection Time: 07/15/19  1:51 AM   Specimen: BLOOD  Result Value Ref Range Status   Specimen Description BLOOD LEFT HAND  Final   Special Requests   Final    BOTTLES DRAWN AEROBIC ONLY Blood Culture adequate volume   Culture   Final    NO GROWTH 3 DAYS Performed at [facility], [address] [place], [place] [address]    Report Status PENDING  Incomplete  SARS CORONAVIRUS 2 (TAT 6-24 HRS) Nasopharyngeal Nasopharyngeal Swab     Status: None   Collection Time: 07/15/19  7:02 AM   Specimen: Nasopharyngeal Swab  Result Value Ref Range Status   SARS Coronavirus 2 NEGATIVE NEGATIVE Final    Comment: (NOTE) SARS-CoV-2 target nucleic acids are NOT DETECTED. The SARS-CoV-2 RNA is generally detectable in upper and lower respiratory specimens during the acute phase of infection. Negative results do not preclude SARS-CoV-2 infection, do not rule out co-infections with other pathogens, and should not be used as the sole basis for treatment or other patient management decisions. Negative  results must be combined with clinical observations, patient history, and epidemiological information. The expected result is Negative. Fact Sheet for Patients: SugarRoll.be Fact Sheet for Healthcare Providers: https://www.woods-mathews.com/ This test is not yet approved or cleared by the [place] FDA and  has been authorized for detection and/or diagnosis of SARS-CoV-2 by FDA under an Emergency Use Authorization (EUA). This EUA will remain  in effect (meaning this test can be used) for the duration of the COVID-19 declaration under Section 56 4(b)(1) of the Act, 21 U.S.C. section 360bbb-3(b)(1), unless the authorization is terminated or revoked sooner. Performed at [facility], [place] [address] [place], [place] [address]   Urine culture     Status: None   Collection Time: 07/15/19  8:45 AM   Specimen: Urine, Clean Catch  Result Value Ref Range Status   Specimen Description URINE, CLEAN CATCH  Final   Special Requests NONE  Final   Culture   Final    NO GROWTH Performed at [facility], [place] [address], [place],  [address]    Report Status 07/16/2019 FINAL  Final      Radiology Studies: [place] EKG SITE RITE  Result Date: 07/19/2019 If Site Rite image not attached, placement could not be confirmed due to current cardiac rhythm.     Scheduled Meds: . DULoxetine  120 mg Oral QHS  . enoxaparin (LOVENOX) injection  0.5 mg/kg Subcutaneous Q24H  . HYDROmorphone   Intravenous Q4H  . Melatonin  6 mg Oral QHS  . nebivolol  10 mg Oral QHS  . pantoprazole  40 mg Oral BID  . rosuvastatin  5 mg Oral QHS  . senna-docusate  1 tablet Oral BID  . sodium chloride flush  3 mL Intravenous Q12H   Continuous Infusions: . cefTRIAXone (ROCEPHIN)  IV 2 g (07/18/19 1508)  . vancomycin       LOS: 4 days      Time spent: 25 minutes   Dessa Phi, DO [facility] 07/19/2019, 10:02 AM   Available via Epic secure chat  7am-7pm After these hours, please refer to coverage provider listed on [email or web address]

## 2019-07-19 NOTE — Progress Notes (Signed)
PHARMACY CONSULT NOTE FOR:  OUTPATIENT  PARENTERAL ANTIBIOTIC THERAPY (OPAT)  Indication: Diskitis Regimen: Vancomycin 1250 mg IV every 12 hours and Rocephin 2g IV every 24 hours End date: 08/25/19  IV antibiotic discharge orders are pended. To discharging provider:  please sign these orders via discharge navigator,  Select New Orders & click on the button choice - Manage This Unsigned Work.     Thank you for allowing pharmacy to be a part of this patient's care.  Alycia Rossetti, PharmD, BCPS Clinical Pharmacist Clinical phone for 07/19/2019: [phone] 07/19/2019 9:38 AM   **Pharmacist phone directory can now be found on [email or web address] ([email or web address]).  Listed under [place].

## 2019-07-19 NOTE — Consult Note (Signed)
Palliative Care Consult Note Reason: Symptom Management  54 yo woman with stage III Rectal cancer/s/p resection, now with discitis. Pain limited QOL and functional status. Currently has been unable to achieve relief with current regimen.   Recommendations:  1. Currently receiving 15.55m/24 hours via PCA +Morphine 33mTID will change this to Fentanyl Patch 10037m(reduced for cross tolerance)+PCA same dosing. 2. Add on gabapentin home dose and start her on Celebrex for inflammation 3. Will work on transitioning her off PCA over the next 24 hours. 4. We discussed the trajectory of her illness and goals of care.  EliLane HackerO Palliative Medicine [phone]: 50 min Greater than 50%  of this time was spent counseling and coordinating care related to the above assessment and plan.

## 2019-07-19 NOTE — Progress Notes (Addendum)
HEMATOLOGY-ONCOLOGY PROGRESS NOTE  SUBJECTIVE: The patient was admitted secondary to worsening back pain.  She had been treated as an outpatient with oral antibiotics for discitis.  IV antibiotics were recommended, but the cost was significant and she could not afford this.  When seen today, the patient reports ongoing pain in her back which radiates down her legs.  Receiving IV pain medication.  She states that she has been talking with case management regarding obtaining IV antibiotics for home use.  Oncology History Overview Note  Rectal adenocarcinoma   Staging form: Colon and Rectum, AJCC 7th Edition     Clinical: T3, N2, M0 - Unsigned     Rectal adenocarcinoma s/p LAR resection 05/25/2015  01/13/2015 Initial Diagnosis   Rectal adenocarcinoma   01/13/2015 Procedure   Colonoscopy showed a sensitivity O nonobstructing mass in the rectum and from 12-18 cm proximal to the Annis. The mass was circumferential, measuring about 6 cm in length. EGD was negative.   01/13/2015 Cancer Staging   Staging form: Colon and Rectum, AJCC 7th Edition - Clinical stage from 01/13/2015: T3, N2, M0 - Signed by Truitt Merle, MD on 12/15/2017   01/17/2015 Tumor Marker   CEA 1.4, CA-19-9 8, MMR normal    01/18/2015 Procedure   Lower endoscopic ultrasound by Dr. Paulita Fujita showed a T3 N2 rectal mass.   01/20/2015 Imaging   CT abdomen and pelvis with contrast showed right lateral rectal wall exophytic mass, and 2 ill-defined hepatic hypoenhancing lesions which appears to correspond to the lesion seen on the prior MRI in 2015.   02/05/2015 Imaging   abdomen MRI showed 2 hemangioma, no suspicion for metastatic disease. CT chest was negative   02/20/2015 - 03/30/2015 Radiation Therapy   neoadjuvant RT to rectal cancer    02/20/2015 Concurrent Chemotherapy   capecitabine 2500 mg in the morning and 2000 mg in the evening (865m/m2, bid), on the day of radiation.    05/25/2015 Surgery   Recto-sigmoid segmental resection, margins are  negative    05/25/2015 Pathology Results   0.2cm residual invasive adenocarcinoma, G2, negative margins, 12 nodes were negative    05/25/2015 Cancer Staging   Staging form: Colon and Rectum, AJCC 7th Edition - Pathologic stage from 05/25/2015: Stage I (T1, N0, cM0) - Signed by FTruitt Merle MD on 12/15/2017   06/23/2015 - 06/29/2015 Hospital Admission   Patient was admitted for pelvic abscess, drain placed and she was treated with IV antibiotics, she also received 1 dose of Feraheme for anemia.   09/15/2015 - 09/17/2015 Hospital Admission   Recurrent pelvic collection s/p perc drainage 09/16/2015   02/21/2016 Imaging   CT abdomen and pelvis w contrast IMPRESSION: 1. Interval removal of surgical drains since 12/19/2015 from the presacral space. Similar size of ill-defined presacral fluid and gas, for which residual abscess cannot be excluded. Similar amount of intraperitoneal edema throughout the upper pelvis with foci of extraluminal gas, possibly related to the presacral process. 2. Diverting right-sided ileostomy, without acute complication. 3. Similar moderate hydroureteronephrosis, likely due to the pelvic process. 4. Possible bladder wall thickening. Correlate with urinalysis. This appearance could be partially due to underdistention. 5. Geographic hepatic steatosis and hemangiomas. Cannot exclude a new right hepatic lobe lesion. Consider nonemergent pre and post contrast outpatient MRI. This could represent a new area of focal steatosis.   05/31/2016 Surgery   ileostomy takedown BY Dr. GJohney Maine   07/04/2016 Imaging   CT Abdomen Pelvis W Contrast 07/04/16 IMPRESSION: Subcutaneous fluid collection at the site  of ileostomy takedown, could be sterile or infected, measuring 9.3 x 4.3 x 5.3 cm; this could be aspirated under ultrasound guidance if clinically indicated. Large collection of stool within the presacral space, could potentially be within a distended distorted rectum but since this  occupies the same position as the abscess collection identified on the previous CT this is suspicious for a contiguous contained extraluminal stool collection communicating with the rectosigmoid colon, collection overall roughly 7 x 8 x 9 cm.   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   12/26/2016 Imaging   IMPRESSION: 1. Stable CT exam of the chest.  No evidence for metastatic disease. 2. Interval left colectomy with left abdominal end colostomy. Presacral soft tissue is similar to prior study when taking into account the interval surgery. 3. Hepatic dome lesion seen on the previous study not evident today, potentially related to bolus timing. The 2.4 cm subcapsular inferior right liver lesion remains subtle and is barely visible on today's exam but shows no substantial interval change in size. Continued attention on follow-up recommended.    12/09/2017 Imaging   CT CAP W Contrast 12/09/17  IMPRESSION: 1. Stable post treatment changes in the presacral space with no evidence of recurrent disease in the pelvis. 2. No evidence of metastatic disease in the chest, abdomen or pelvis. 3. Large parastomal hernia has increased in size and contains much of the remnant left colon. No evidence of bowel obstruction or ischemia. 4. Stable dilated main pulmonary artery, suggesting chronic pulmonary arterial hypertension. 5.  Aortic Atherosclerosis (ICD10-I70.0).   12/31/2017 Genetic Testing   The Multi-Cancer Panel offered by [facility] includes sequencing and/or deletion duplication testing of the following 83 genes: ALK, APC, ATM, AXIN2,BAP1,  BARD1, BLM, BMPR1A, BRCA1, BRCA2, BRIP1, CASR, CDC73, CDH1, CDK4, CDKN1B, CDKN1C, CDKN2A (p14ARF), CDKN2A (p16INK4a), CEBPA, CHEK2, CTNNA1, DICER1, DIS3L2, EGFR (c.2369C>T, p.Thr790Met variant only), EPCAM (Deletion/duplication testing only), FH, FLCN, GATA2, GPC3, GREM1 (Promoter  region deletion/duplication testing only), HOXB13 (c.251G>A, p.Gly84Glu), HRAS, KIT, MAX, MEN1, MET, MITF (c.952G>A, p.Glu318Lys variant only), MLH1, MSH2, MSH3, MSH6, MUTYH, NBN, NF1, NF2, NTHL1, PALB2, PDGFRA, PHOX2B, PMS2, POLD1, POLE, POT1, PRKAR1A, PTCH1, PTEN, RAD50, RAD51C, RAD51D, RB1, RECQL4, RET, RUNX1, SDHAF2, SDHA (sequence changes only), SDHB, SDHC, SDHD, SMAD4, SMARCA4, SMARCB1, SMARCE1, STK11, SUFU, TERC, TERT, TMEM127, TP53, TSC1, TSC2, VHL, WRN and WT1.   Results: Negative, no pathogenic variants identified.  The date of this test report is 12/31/2017.    02/02/2019 Imaging   CT CAP W Contrast  IMPRESSION: 1. Status post descending colostomy, without recurrent or metastatic disease. 2. Presacral soft tissue thickening is similar in configuration. New tiny foci of gas within the presacral soft tissues, nonspecific. No well-defined fistulous communication to bowel identified. 3. Possible constipation. 4. Probable mild hepatic steatosis. 5. Interval repair of parastomal hernia.   Aortic Atherosclerosis (ICD10-I70.0).   06/18/2019 Imaging   CT AP W Contrast  IMPRESSION: 1. Stable post treatment changes in presacral region. No evidence of recurrent or metastatic carcinoma within the abdomen or pelvis. 2. New osteolysis involving the superior and inferior vertebral endplates at A1-9, suspicious for infectious discitis. Recommend clinical correlation, and consider lumbar spine MRI without and with contrast for further evaluation.   These results will be called to the ordering clinician or representative by the Radiologist Assistant, and communication documented in the PACS or zVision Dashboard.    06/26/2019 Imaging    MRI Lumbar 06/26/19  IMPRESSION: Extensive abnormality  at L3-4 with bone marrow edema and enhancement as well as extensive endplate irregularity. There is paraspinous soft tissue thickening and enhancement as well as epidural thickening and enhancement.  Findings are most likely due to chronic infection. Another possibility would be severe adjacent segment degeneration above the level of fusion at L4-5. Metastatic disease not considered likely.      REVIEW OF SYSTEMS:   Noncontributory except as noted in the HPI.  PHYSICAL EXAMINATION: ECOG PERFORMANCE STATUS: 3 - Symptomatic, >50% confined to bed  Vitals:   07/19/19 0808 07/19/19 1104  BP:  (!) 149/72  Pulse:  66  Resp: 14 18  Temp:  97.9 F (36.6 C)  SpO2: 96% 97%   Filed Weights   07/14/19 1809  Weight: (!) 335 lb 1.6 oz (152 kg)    Intake/Output from previous day: 01/03 0701 - 01/04 0700 In: 1320 [P.O.:890; I.V.:30; IV Piggyback:400] Out: 1225 [Urine:1225]  GENERAL:alert, no distress and comfortable LUNGS: clear to auscultation and percussion with normal breathing effort HEART: regular rate & rhythm and no murmurs and no lower extremity edema ABDOMEN:abdomen soft, non-tender and normal bowel sounds Musculoskeletal:no cyanosis of digits and no clubbing  NEURO: alert & oriented x 3 with fluent speech, no focal motor/sensory deficits  LABORATORY DATA:  I have reviewed the data as listed CMP Latest Ref Rng & Units 07/19/2019 07/16/2019 07/15/2019  Glucose 70 - 99 mg/dL 96 100(H) 119(H)  BUN 6 - 20 mg/dL 12 9 6   Creatinine 0.44 - 1.00 mg/dL 0.47 0.60 0.52  Sodium 135 - 145 mmol/L 140 137 138  Potassium 3.5 - 5.1 mmol/L 4.2 3.9 3.9  Chloride 98 - 111 mmol/L 100 101 101  CO2 22 - 32 mmol/L 30 27 25   Calcium 8.9 - 10.3 mg/dL 9.6 9.6 9.9  Total Protein 6.5 - 8.1 g/dL - - 7.1  Total Bilirubin 0.3 - 1.2 mg/dL - - 0.8  Alkaline Phos 38 - 126 U/L - - 75  AST 15 - 41 U/L - - 12(L)  ALT 0 - 44 U/L - - 11    Lab Results  Component Value Date   WBC 8.1 07/16/2019   HGB 11.1 (L) 07/16/2019   HCT 35.9 (L) 07/16/2019   MCV 88.9 07/16/2019   PLT 292 07/16/2019   NEUTROABS 8.3 (H) 07/15/2019    MR LUMBAR SPINE WO CONTRAST  Result Date: 07/15/2019 CLINICAL DATA:  Low  back pain with progressive neurologic deficit EXAM: MRI LUMBAR SPINE WITHOUT CONTRAST TECHNIQUE: Multiplanar, multisequence MR imaging of the lumbar spine was performed. No intravenous contrast was administered. COMPARISON:  06/26/2019 FINDINGS: Truncated study due to patient pain. Only sagittal imaging was acquired, without contrast. Redemonstrated changes of discitis osteomyelitis at L3-4 with disc space purulence and endplate destruction. The disc space appears taller and more T2 hyperintense. There is paraspinal inflammation extending into the bilateral psoas which is progressed based on sagittal STIR imaging. No evidence of dorsal epidural abscess. T2 hyperintense material ventral to the L4 body follows fat signal and is unchanged. There is L3-4 high-grade spinal stenosis from degenerative posterior element hypertrophy, disc bulging, and epidural fat expansion. Pre-existing L4-5 PLIF without evident solid fusion by recent abdominal CT IMPRESSION: 1. Truncated noncontrast study due to patient pain. 2. L3-4 discitis osteomyelitis with progressive paravertebral inflammation/phlegmon compared to 06/26/2019. Intervertebral widening and purulence has likely also progressed. 3. High-grade thecal sac narrowing at L3-4/L4 attributed to degenerative changes and epidural fat expansion. No change in epidural soft tissues to imply interval abscess.  Electronically Signed   By: Monte Fantasia M.D.   On: 07/15/2019 06:54   MR Lumbar Spine W Wo Contrast  Result Date: 06/26/2019 CLINICAL DATA:  Low back pain with bilateral hip and leg pain. L4-5 fusion October 2019. Abnormal CT. Rule out infection or metastatic disease. EXAM: MRI LUMBAR SPINE WITHOUT AND WITH CONTRAST TECHNIQUE: Multiplanar and multiecho pulse sequences of the lumbar spine were obtained without and with intravenous contrast. CONTRAST:  73m GADAVIST GADOBUTROL 1 MMOL/ML IV SOLN COMPARISON:  CT abdomen pelvis 06/18/2019.  Lumbar MRI 04/10/2018 FINDINGS:  Segmentation:  Normal Alignment:  Slight anterolisthesis L4-5. Vertebrae: Extensive bone marrow edema at L3-4. There is irregularity of the endplates. The disc shows increased signal on inversion recovery and mild enhancement. Bone marrow shows diffuse enhancement. Multiple endplate defects are noted on CT which have relatively well-defined margins. Bone marrow edema and enhancement was not seen at this level on the MRI of 04/10/2018 Negative for fracture.  No mass lesion identified. Conus medullaris and cauda equina: Conus extends to the L1 level. Conus and cauda equina appear normal. Paraspinal and other soft tissues: Mild paraspinous soft tissue thickening and enhancement at the L3-4 level. In addition, there is ventral epidural thickening and enhancement at this level without abscess. No psoas abscess identified. Disc levels: L1-2: Mild facet degeneration bilaterally.  Negative for stenosis L2-3: Diffuse bulging of the disc. Mild facet degeneration and mild spinal stenosis. L3-4: Endplate irregularity with diffuse bone marrow edema and enhancement at L3 and L4. Sclerotic changes in the L4 vertebral body on CT. Bilateral facet hypertrophy. Moderate spinal stenosis. L4-5: Pedicle screw and interbody fusion.  Negative for stenosis L5-S1: Negative IMPRESSION: Extensive abnormality at L3-4 with bone marrow edema and enhancement as well as extensive endplate irregularity. There is paraspinous soft tissue thickening and enhancement as well as epidural thickening and enhancement. Findings are most likely due to chronic infection. Another possibility would be severe adjacent segment degeneration above the level of fusion at L4-5. Metastatic disease not considered likely. Electronically Signed   By: CFranchot GalloM.D.   On: 06/26/2019 19:08   IR PICC PLACEMENT RIGHT >5 YRS INC IMG GUIDE  Result Date: 07/01/2019 INDICATION: Lumbar discitis. Need for central venous access for long-term IV antibiotics. EXAM: ULTRASOUND  AND FLUOROSCOPIC GUIDED PICC LINE INSERTION MEDICATIONS: 1% tetracaine 3 mL CONTRAST:  None FLUOROSCOPY TIME:  12 seconds COMPLICATIONS: None immediate. TECHNIQUE: The procedure, risks, benefits, and alternatives were explained to the patient and informed written consent was obtained. A timeout was performed prior to the initiation of the procedure. The right upper extremity was prepped with chlorhexidine in a sterile fashion, and a sterile drape was applied covering the operative field. Maximum barrier sterile technique with sterile gowns and gloves were used for the procedure. A timeout was performed prior to the initiation of the procedure. Local anesthesia was provided with 1% tetracaine. After the overlying soft tissues were anesthetized, a micropuncture kit was utilized to access the right basilic vein. Real-time ultrasound guidance was utilized for vascular access including the acquisition of a permanent ultrasound image documenting patency of the accessed vessel. A guidewire was advanced to the level of the superior caval-atrial junction for measurement purposes and the PICC line was cut to length. A peel-away sheath was placed and a 46 cm, 5 [place] single lumen was inserted to level of the superior caval-atrial junction. A post procedure spot fluoroscopic was obtained. The catheter easily aspirated and flushed and was secured in place. A dressing was  placed. The patient tolerated the procedure well without immediate post procedural complication. FINDINGS: After catheter placement, the tip lies within the superior cavoatrial junction. The catheter aspirates and flushes normally and is ready for immediate use. IMPRESSION: Successful ultrasound and fluoroscopic guided placement of a right basilic vein approach, 46 cm, 5 French, single lumen PICC with tip at the superior caval-atrial junction. The PICC line is ready for immediate use. Read by: Gareth Eagle, PA-C Electronically Signed   By: Aletta Edouard M.D.    On: 07/01/2019 09:20   [place] EKG SITE RITE  Result Date: 07/19/2019 If Site Rite image not attached, placement could not be confirmed due to current cardiac rhythm.  IR LUMBAR DISC ASPIRATION W/IMG GUIDE  Result Date: 07/02/2019 INDICATION: Severe low back pain secondary to lumbar discitis at L3-L4. EXAM: DISC ASPIRATION AT L3-L4 MEDICATIONS: The patient is currently admitted to the hospital and receiving intravenous antibiotics. The antibiotics were administered within an appropriate time frame prior to the initiation of the procedure. ANESTHESIA/SEDATION: Fentanyl 3 mcg IV; Versed 75 mg IV.  Dilaudid 1 mg IV. Moderate Sedation Time:  29 minutes The patient was continuously monitored during the procedure by the interventional radiology nurse under my direct supervision. COMPLICATIONS: None immediate. PROCEDURE: Informed written consent was obtained from the patient after a thorough discussion of the procedural risks, benefits and alternatives. All questions were addressed. Maximal Sterile Barrier Technique was utilized including caps, mask, sterile gowns, sterile gloves, sterile drape, hand hygiene and skin antiseptic. A timeout was performed prior to the initiation of the procedure. The patient was laid prone on the fluoroscopic table. The skin overlying the lumbar region was then prepped and draped in the usual sterile fashion. The right paramedian approach to the L3-L4 disc space was then infiltrated with tetracaine local anesthetic. Using biplane intermittent fluoroscopy, a 21 gauge Franseen needle was then advanced into the L3-L4 disc space with crossing of the midline. Two passes were made with two separate needles. A combined approximately 2 mL of bloody aspirate was obtained and sent for microbiologic analysis. Hemostasis was achieved at the skin entry site. IMPRESSION: Status post fluoro guided disc aspiration at L3-L4 x 2 as described above without event. Electronically Signed   By: Luanne Bras M.D.   On: 07/01/2019 11:24    ASSESSMENT AND PLAN: 1.  Lower back pain secondary to L3-4 infection 2.  Rectal adenocarcinoma, stage IIIc 3.  Chronic pain 4.  Hypertension  -PICC line has been placed for IV vancomycin and ceftriaxone x6 weeks.  Case management is currently working on helping the patient afford her IV antibiotics for home use.  The patient states that she has had ongoing discussions with case management regarding this. -The patient will keep her outpatient follow-up at the cancer center for management of her rectal adenocarcinoma. -For her chronic pain, referral is in process to Dr. Maryjean Ka.   LOS: 4 days   Mikey Bussing, DNP, AGPCNP-BC, AOCNP 07/19/19    Addendum  I have seen the patient, examined her. I agree with the assessment and and plan and have edited the notes.   Besan feels her pain overall is better, she was able to sit on the edge of bed for a short period of time today. She is still on PCA, will start fentanyl patch today.  She has spoken to case manager, due to the cost of her hospital stay, her out-of-pocket cost for home IV antibiotics will be significantly lower now, and it's affordable. I encourage  her to participate PT when her pain is better controlled. She would like to have home PT.    I will refer her to pain specialist Dr. Maryjean Ka for her pain management as outpatient.  I will be happy to manage her pain medication after her hospital discharge, before she sees Dr. Luan Pulling, if needed.  I will f/u as needed before her discharge. Let me know if there is anything I can do for her.  Truitt Merle  07/19/2019

## 2019-07-19 NOTE — Progress Notes (Signed)
Dilaudid syringe changed, 6 mL wasted from old syringe in stericycle. Witnessed by Jesse Fall, RN.

## 2019-07-19 NOTE — Progress Notes (Signed)
Physical Therapy Treatment Patient Details Name: Lindsay Little MRN: [id number] DOB: June 27, 1966 Today's Date: 07/19/2019    History of Present Illness 54yo female with history of discitis with symptoms since October 2020, getting progressively worse; she apparently had a PICC placed but did not use it due to high copays for the medications. Hypoxic in ED. MRI shows L3-L4 discitis osteomyelitis with progressive perivertebral inflammation and high grade thecal sac narrowing at L3-L4. PMH rectal CA, chemo induced neuropathy, colostomy placement, HTN, IBS, HLD, HTN, obesity, hx rotator cuff tear, lumbar surgery 2019, colon resection, knee arthroscopy, hernia repair    PT Comments    Pt progressing with functional mobility today and able to tolerate sitting EOB for short amount of time. Pt continues to be limited by back pain and L hip pain/spasms increased with mobility, thus unable to progress to standing today. Pt performed bed mobility with supervision for rolling and supine>sit with use of bed features, mod assist needed for LE elevation during sit>supine. +2 present throughout session for line management and emotional support secondary to pt's significant pain and tearfulness. Pt asking why her back is affecting her left hip, therapist provided education regarding this today. Continue to recommend home with home health therapies to maximize functional independence with mobility. Will continue to follow acutely in order to progress OOB mobility and activity.    Follow Up Recommendations  Home health PT;Supervision/Assistance - 24 hour     Equipment Recommendations  Other (comment)(tbd)    Recommendations for Other Services       Precautions / Restrictions Precautions Precautions: Fall;Back;Other (comment) Precaution Booklet Issued: No Precaution Comments: severe back pain Restrictions Weight Bearing Restrictions: No    Mobility  Bed Mobility Overal bed mobility: Needs  Assistance Bed Mobility: Supine to Sit;Sit to Supine;Rolling Rolling: Supervision   Supine to sit: Supervision Sit to supine: Mod assist   General bed mobility comments: assist for line management, pt uses bedrails and HOB elevated to come to sitting EOB with supervision. Sit>supine with mod assist for LE elevation.  Transfers Overall transfer level: Needs assistance   Transfers: Lateral/Scoot Transfers          Lateral/Scoot Transfers: Min guard General transfer comment: pt performed lateral scooting towards the [facility] today with min guard assist, pt able to push through LEs and clear hips from bed during scooting. Unable to progress to standing activity today secondary to pain.      Balance Overall balance assessment: Needs assistance Sitting-balance support: Feet supported Sitting balance-Leahy Scale: Good Sitting balance - Comments: sitting balance EOB today x 5 min, supervision                                    Cognition Arousal/Alertness: Awake/alert Behavior During Therapy: WFL for tasks assessed/performed Overall Cognitive Status: Within Functional Limits for tasks assessed                                 General Comments: Pt tearful today secondary to pain, frustrated with staff and frustrated that she does not know why she has an infection      Exercises      General Comments General comments (skin integrity, edema, etc.): tearful throughout session, therapist's providing emotional support and education      Pertinent Vitals/Pain Pain Score: 8 (increased to 10/10 pain with sitting EOB) Pain Location:  back and hips with movement Pain Descriptors / Indicators: Radiating;Sharp;Squeezing;Stabbing;Grimacing;Moaning Pain Intervention(s): Limited activity within patient's tolerance;Monitored during session;Repositioned    Home Living                      Prior Function            PT Goals (current goals can now be found  in the care plan section) Progress towards PT goals: Progressing toward goals    Frequency    Min 3X/week      PT Plan Current plan remains appropriate    Co-evaluation PT/OT/SLP Co-Evaluation/Treatment: Yes Reason for Co-Treatment: Other (comment)(significant pain) PT goals addressed during session: Mobility/safety with mobility        AM-PAC PT "6 Clicks" Mobility   Outcome Measure  Help needed turning from your back to your side while in a flat bed without using bedrails?: None Help needed moving from lying on your back to sitting on the side of a flat bed without using bedrails?: A Little Help needed moving to and from a bed to a chair (including a wheelchair)?: A Lot Help needed standing up from a chair using your arms (e.g., wheelchair or bedside chair)?: A Lot Help needed to walk in hospital room?: A Lot Help needed climbing 3-5 steps with a railing? : Total 6 Click Score: 14    End of Session   Activity Tolerance: Patient limited by pain Patient left: in bed;with call bell/phone within reach;with bed alarm set Nurse Communication: Patient requests pain meds PT Visit Diagnosis: Pain;Difficulty in walking, not elsewhere classified (R26.2) Pain - Right/Left: Left Pain - part of body: Hip(and back)     Time: 2563-8937 PT Time Calculation (min) (ACUTE ONLY): 27 min  Charges:  $Therapeutic Activity: 8-22 mins                     Netta Corrigan, PT, DPT, CSRS Acute Rehab Office [place] 07/19/2019, 10:37 AM

## 2019-07-20 ENCOUNTER — Ambulatory Visit: Payer: Medicare Other | Admitting: Infectious Disease

## 2019-07-20 LAB — CULTURE, BLOOD (ROUTINE X 2)
Culture: NO GROWTH
Culture: NO GROWTH
Special Requests: ADEQUATE

## 2019-07-20 MED ORDER — SODIUM CHLORIDE 0.9% FLUSH: 10.0000 mL | INTRAVENOUS | Status: DC | PRN

## 2019-07-20 MED ORDER — SODIUM CHLORIDE 0.9% FLUSH
10.0000 mL | Freq: Two times a day (BID) | INTRAVENOUS | Status: DC
  Administered 2019-07-20 – 2019-07-21 (×3): 10 mL

## 2019-07-20 MED ORDER — CHLORHEXIDINE GLUCONATE CLOTH 2 % EX PADS
6.0000 | MEDICATED_PAD | Freq: Every day | CUTANEOUS | Status: DC
  Administered 2019-07-21: 10:00:00 6 via TOPICAL

## 2019-07-20 MED ORDER — HYDROMORPHONE HCL 1 MG/ML IJ SOLN
0.5000 mg | INTRAMUSCULAR | Status: DC | PRN
  Administered 2019-07-21 (×3): 0.5 mg via INTRAVENOUS
  Filled 2019-07-20 (×4): qty 0.5

## 2019-07-20 NOTE — Progress Notes (Signed)
PCA pump discontinued. Wasted 25m of Dilaudid with Ala Abukhalaf, RN in stericycle.  Patient administered 1.54mof Dilaudid

## 2019-07-20 NOTE — Progress Notes (Signed)
Physical Therapy Treatment Patient Details Name: Lindsay Little MRN: [id number] DOB: 1966/04/03 Today's Date: 07/20/2019    History of Present Illness 54yo female with history of discitis with symptoms since October 2020, getting progressively worse; she apparently had a PICC placed but did not use it due to high copays for the medications. Hypoxic in ED. MRI shows L3-L4 discitis osteomyelitis with progressive perivertebral inflammation and high grade thecal sac narrowing at L3-L4. PMH rectal CA, chemo induced neuropathy, colostomy placement, HTN, IBS, HLD, HTN, obesity, hx rotator cuff tear, lumbar surgery 2019, colon resection, knee arthroscopy, hernia repair    PT Comments    Pt remains limited to bed level exercises and mobility.  She performed sitting edge of bed and only tolerated sitting x3 before needing to return to supine due to pain.  Pt limited due to pain.  Pressed PCA during session but little to no relief.  Applied instant hot packs to area.  Pt continues to benefit from skilled rehab at home, and may require rehab if she continues to be limited to bed level.  Plan next session to progression to standing.       Follow Up Recommendations  Home health PT;Supervision/Assistance - 24 hour     Equipment Recommendations  Other (comment)(TBD)    Recommendations for Other Services       Precautions / Restrictions Precautions Precautions: Fall;Back;Other (comment) Precaution Booklet Issued: No Precaution Comments: severe back pain Restrictions Weight Bearing Restrictions: No    Mobility  Bed Mobility Overal bed mobility: Needs Assistance Bed Mobility: Supine to Sit;Sit to Supine Rolling: Supervision   Supine to sit: Mod assist     General bed mobility comments: assist for line management, pt uses bedrails and HOB elevated to come to sitting EOB with supervision. Sit>supine with mod assist for LE elevation.  Transfers Overall transfer level: (Declined sitting edge of  bed.)                  Ambulation/Gait                 Stairs             Wheelchair Mobility    Modified Rankin (Stroke Patients Only)       Balance Overall balance assessment: Needs assistance Sitting-balance support: Feet supported Sitting balance-Leahy Scale: Good Sitting balance - Comments: sitting balance EOB today x 3 min but pain increased and required return back to bed.                                    Cognition Arousal/Alertness: Awake/alert Behavior During Therapy: WFL for tasks assessed/performed Overall Cognitive Status: Within Functional Limits for tasks assessed                                 General Comments: Pt tearful today secondary to pain, frustrated with staff and frustrated that she does not know why she has an infection      Exercises General Exercises - Lower Extremity Ankle Circles/Pumps: AROM;Both;20 reps;Supine Quad Sets: AROM;Both;Supine Gluteal Sets: AROM;Both;10 reps;Supine Heel Slides: AROM;Both;10 reps;Supine;Limitations Heel Slides Limitations: limited ROM due to pain.    General Comments        Pertinent Vitals/Pain Pain Assessment: 0-10 Pain Score: 8  Pain Location: back and hips with movement Pain Descriptors / Indicators: Radiating;Sharp;Squeezing;Stabbing;Grimacing;Moaning Pain Intervention(s): Monitored during  session;Repositioned;PCA encouraged    Home Living                      Prior Function            PT Goals (current goals can now be found in the care plan section) Acute Rehab PT Goals Patient Stated Goal: "To not live like this." Potential to Achieve Goals: Good Progress towards PT goals: Progressing toward goals    Frequency    Min 3X/week      PT Plan Current plan remains appropriate    Co-evaluation              AM-PAC PT "6 Clicks" Mobility   Outcome Measure  Help needed turning from your back to your side while in a flat  bed without using bedrails?: None Help needed moving from lying on your back to sitting on the side of a flat bed without using bedrails?: A Little Help needed moving to and from a bed to a chair (including a wheelchair)?: A Lot Help needed standing up from a chair using your arms (e.g., wheelchair or bedside chair)?: A Lot Help needed to walk in hospital room?: A Lot Help needed climbing 3-5 steps with a railing? : Total 6 Click Score: 14    End of Session Equipment Utilized During Treatment: Gait belt Activity Tolerance: Patient limited by pain Patient left: in bed;with call bell/phone within reach;with bed alarm set Nurse Communication: Patient requests pain meds PT Visit Diagnosis: Pain;Difficulty in walking, not elsewhere classified (R26.2) Pain - Right/Left: Left Pain - part of body: Hip     Time: 3338-3291 PT Time Calculation (min) (ACUTE ONLY): 30 min  Charges:  $Therapeutic Exercise: 8-22 mins $Therapeutic Activity: 8-22 mins                     Lindsay Little , PTA Acute Rehabilitation Services Pager [phone] Office [phone]     Lindsay Little Eli Hose 07/20/2019, 4:00 PM

## 2019-07-20 NOTE — Plan of Care (Signed)
  Problem: Nutrition: Goal: Adequate nutrition will be maintained Outcome: Progressing   Problem: Pain Managment: Goal: General experience of comfort will improve Outcome: Progressing   

## 2019-07-20 NOTE — Progress Notes (Signed)
Peripherally Inserted Central Catheter/Midline Placement  The IV Nurse has discussed with the patient and/or persons authorized to consent for the patient, the purpose of this procedure and the potential benefits and risks involved with this procedure.  The benefits include less needle sticks, lab draws from the catheter, and the patient may be discharged home with the catheter. Risks include, but not limited to, infection, bleeding, blood clot (thrombus formation), and puncture of an artery; nerve damage and irregular heartbeat and possibility to perform a PICC exchange if needed/ordered by physician.  Alternatives to this procedure were also discussed.  Bard Power PICC patient education guide, fact sheet on infection prevention and patient information card has been provided to patient /or left at bedside.    PICC/Midline Placement Documentation  PICC Single Lumen 07/20/19 PICC Right Brachial 44 cm 0 cm (Active)  Indication for Insertion or Continuance of Line Home intravenous therapies (PICC only) 07/20/19 1400  Exposed Catheter (cm) 0 cm 07/20/19 1400  Site Assessment Clean;Dry;Intact 07/20/19 1400  Line Status Flushed;Saline locked;Blood return noted 07/20/19 1400  Dressing Type Transparent;Securing device 07/20/19 1400  Dressing Status Clean;Dry;Intact;Antimicrobial disc in place 07/20/19 1400  Dressing Change Due 07/27/19 07/20/19 1400       [name] 07/20/2019, 2:57 PM

## 2019-07-20 NOTE — Progress Notes (Addendum)
PROGRESS NOTE    Lindsay Little  YTK:[id number] DOB: 1965/07/20 DOA: 07/14/2019 PCP: [name], MD     Brief Narrative:  Lindsay Little is a 54 y.o. femalewith medical history significant ofdiscitis, rectal adenocarcinomas/pradiation,s/pcolostomy, chronic pain. Patient presented secondary to worsening back pain with leg weakness in setting of known L3-4 discitis. Neurosurgery consulted and recommended no surgical management.   New events last 24 hours / Subjective: Very lethargic this morning  Assessment & Plan:   Principal Problem:   Discitis Active Problems:   Pure hypercholesterolemia   Morbid obesity with body mass index of 40.0-49.9 (HCC)   Rectal adenocarcinoma s/p LAR resection 05/25/2015   Hypertension   Anemia of chronic disease   Chronic pain syndrome    L3-L4 Discitis -Patient was being treated for discitis with oral antibiotics as an outpatient with worsening symptoms now requiring IV antibiotics. Neurosurgery consulted and recommended no surgical management -Blood cultures (12/31) no growth to date -ID recommendations: Vancomycin and Ceftriaxone x 6 weeks  -Follow up in ID office 08/03/2019  -PT/OT rec home health  -PICC line placement pending for home IV antibiotic  Chronic pain syndrome -Patient is on morphine IR q6 hours as an outpatient -Palliative care consulted for pain management --> fentanyl patch, gabapentin, celebrex, wean PCA pump, cymbalta  -Currently being managed by PCP as well as Dr. Burr Medico.  Outpatient referral to Dr. Maryjean Ka in process  Essential hypertension -Continue Bystolic  Hyperlipidemia -Continue Crestor  History of rectal adenocarcinoma -S/p LAR resection, permanent colostomy  Morbid obesity -Estimated body mass index is 47.4 kg/m as calculated from the following:   Height as of this encounter: 5' 10.5" (1.791 m).   Weight as of this encounter: 152 kg.    DVT prophylaxis: Lovenox Code Status:  Full Family Communication: None at bedside Disposition Plan: Pending weaning off PCA pump, PICC placement.    Consultants:   Palliative care  Infectious disease  Neurosurgery  Oncology    Antimicrobials:  Anti-infectives (From admission, onward)   Start     Dose/Rate Route Frequency Ordered Stop   07/19/19 1000  vancomycin (VANCOREADY) IVPB 1250 mg/250 mL     1,250 mg 166.7 mL/hr over 90 Minutes Intravenous Every 12 hours 07/18/19 2238     07/19/19 0000  cefTRIAXone (ROCEPHIN) IVPB     2 g Intravenous Every 24 hours 07/19/19 1625 08/25/19 2359   07/19/19 0000  vancomycin IVPB     1,250 mg Intravenous Every 12 hours 07/19/19 1625 08/25/19 2359   07/16/19 1000  vancomycin (VANCOREADY) IVPB 1500 mg/300 mL  Status:  Discontinued     1,500 mg 150 mL/hr over 120 Minutes Intravenous Every 12 hours 07/15/19 1932 07/18/19 2238   07/15/19 0915  vancomycin (VANCOREADY) IVPB 2000 mg/400 mL  Status:  Discontinued     2,000 mg 200 mL/hr over 120 Minutes Intravenous Every 8 hours 07/15/19 0902 07/15/19 1932   07/15/19 0900  vancomycin (VANCOREADY) IVPB 1500 mg/300 mL  Status:  Discontinued     1,500 mg 150 mL/hr over 120 Minutes Intravenous  Once 07/15/19 0848 07/15/19 0902   07/15/19 0900  cefTRIAXone (ROCEPHIN) 2 g in sodium chloride 0.9 % 100 mL IVPB     2 g 200 mL/hr over 30 Minutes Intravenous Every 24 hours 07/15/19 0848         Objective: Vitals:   07/20/19 0009 07/20/19 0339 07/20/19 0730 07/20/19 0730  BP:  (!) 125/54 137/80 137/80  Pulse:  (!) 59 (!) 56 (!) 52  Resp: 18 18 13 13   Temp:  99 F (37.2 C) (!) 97.5 F (36.4 C) (!) 97.5 F (36.4 C)  TempSrc:   Oral Oral  SpO2: 96% 95% 100% 99%  Weight:      Height:        Intake/Output Summary (Last 24 hours) at 07/20/2019 6378 Last data filed at 07/19/2019 2032 Gross per 24 hour  Intake --  Output 1200 ml  Net -1200 ml   Filed Weights   07/14/19 1809  Weight: (!) 152 kg    Examination: General exam: Appears  calm and comfortable  Respiratory system: Clear to auscultation. Respiratory effort normal. Cardiovascular system: S1 & S2 heard, RRR. No pedal edema. Gastrointestinal system: Abdomen is nondistended, soft and nontender. Normal bowel sounds heard. Central nervous system: Alert to voice, lethargic  Extremities: Symmetric in appearance bilaterally    Data Reviewed: I have personally reviewed following labs and imaging studies  CBC: Recent Labs  Lab 07/15/19 0154 07/16/19 0219  WBC 9.6 8.1  NEUTROABS 8.3*  --   HGB 11.3* 11.1*  HCT 35.8* 35.9*  MCV 86.9 88.9  PLT 313 588   Basic Metabolic Panel: Recent Labs  Lab 07/15/19 0154 07/16/19 0219 07/19/19 1034  NA 138 137 140  K 3.9 3.9 4.2  CL 101 101 100  CO2 25 27 30   GLUCOSE 119* 100* 96  BUN 6 9 12   CREATININE 0.52 0.60 0.47  CALCIUM 9.9 9.6 9.6   GFR: Estimated Creatinine Clearance: 133.2 mL/min (by C-G formula based on SCr of 0.47 mg/dL). Liver Function Tests: Recent Labs  Lab 07/15/19 0154  AST 12*  ALT 11  ALKPHOS 75  BILITOT 0.8  PROT 7.1  ALBUMIN 3.4*   No results for input(s): LIPASE, AMYLASE in the last 168 hours. No results for input(s): AMMONIA in the last 168 hours. Coagulation Profile: No results for input(s): INR, PROTIME in the last 168 hours. Cardiac Enzymes: No results for input(s): CKTOTAL, CKMB, CKMBINDEX, TROPONINI in the last 168 hours. BNP (last 3 results) No results for input(s): PROBNP in the last 8760 hours. HbA1C: No results for input(s): HGBA1C in the last 72 hours. CBG: No results for input(s): GLUCAP in the last 168 hours. Lipid Profile: No results for input(s): CHOL, HDL, LDLCALC, TRIG, CHOLHDL, LDLDIRECT in the last 72 hours. Thyroid Function Tests: No results for input(s): TSH, T4TOTAL, FREET4, T3FREE, THYROIDAB in the last 72 hours. Anemia Panel: No results for input(s): VITAMINB12, FOLATE, FERRITIN, TIBC, IRON, RETICCTPCT in the last 72 hours. Sepsis Labs: No results for  input(s): PROCALCITON, LATICACIDVEN in the last 168 hours.  Recent Results (from the past 240 hour(s))  Blood culture (routine x 2)     Status: None (Preliminary result)   Collection Time: 07/15/19  1:45 AM   Specimen: BLOOD  Result Value Ref Range Status   Specimen Description BLOOD RIGHT ARM  Final   Special Requests   Final    BOTTLES DRAWN AEROBIC AND ANAEROBIC Blood Culture results may not be optimal due to an excessive volume of blood received in culture bottles   Culture   Final    NO GROWTH 4 DAYS Performed at [facility], [place] [address] [place], [place] [address]    Report Status PENDING  Incomplete  Blood culture (routine x 2)     Status: None (Preliminary result)   Collection Time: 07/15/19  1:51 AM   Specimen: BLOOD  Result Value Ref Range Status   Specimen Description BLOOD  LEFT HAND  Final   Special Requests   Final    BOTTLES DRAWN AEROBIC ONLY Blood Culture adequate volume   Culture   Final    NO GROWTH 4 DAYS Performed at [facility], [address], [place], [place] [address]    Report Status PENDING  Incomplete  SARS CORONAVIRUS 2 (TAT 6-24 HRS) Nasopharyngeal Nasopharyngeal Swab     Status: None   Collection Time: 07/15/19  7:02 AM   Specimen: Nasopharyngeal Swab  Result Value Ref Range Status   SARS Coronavirus 2 NEGATIVE NEGATIVE Final    Comment: (NOTE) SARS-CoV-2 target nucleic acids are NOT DETECTED. The SARS-CoV-2 RNA is generally detectable in upper and lower respiratory specimens during the acute phase of infection. Negative results do not preclude SARS-CoV-2 infection, do not rule out co-infections with other pathogens, and should not be used as the sole basis for treatment or other patient management decisions. Negative results must be combined with clinical observations, patient history, and epidemiological information. The expected result is Negative. Fact Sheet for Patients: SugarRoll.be Fact  Sheet for Healthcare Providers: https://www.woods-mathews.com/ This test is not yet approved or cleared by the [place] FDA and  has been authorized for detection and/or diagnosis of SARS-CoV-2 by FDA under an Emergency Use Authorization (EUA). This EUA will remain  in effect (meaning this test can be used) for the duration of the COVID-19 declaration under Section 56 4(b)(1) of the Act, 21 U.S.C. section 360bbb-3(b)(1), unless the authorization is terminated or revoked sooner. Performed at [facility], [place] [address] [place], [place] [address]   Urine culture     Status: None   Collection Time: 07/15/19  8:45 AM   Specimen: Urine, Clean Catch  Result Value Ref Range Status   Specimen Description URINE, CLEAN CATCH  Final   Special Requests NONE  Final   Culture   Final    NO GROWTH Performed at [facility], [place] [address] [place],  [address]    Report Status 07/16/2019 FINAL  Final      Radiology Studies: [place] EKG SITE RITE  Result Date: 07/19/2019 If Site Rite image not attached, placement could not be confirmed due to current cardiac rhythm.     Scheduled Meds: . celecoxib  200 mg Oral BID  . DULoxetine  120 mg Oral QHS  . enoxaparin (LOVENOX) injection  0.5 mg/kg Subcutaneous Q24H  . fentaNYL  1 patch Transdermal Q72H  . gabapentin  800 mg Oral QID  . HYDROmorphone   Intravenous Q4H  . Melatonin  6 mg Oral QHS  . nebivolol  10 mg Oral QHS  . pantoprazole  40 mg Oral BID  . rosuvastatin  5 mg Oral QHS  . senna-docusate  1 tablet Oral BID  . sodium chloride flush  3 mL Intravenous Q12H   Continuous Infusions: . cefTRIAXone (ROCEPHIN)  IV 2 g (07/19/19 1003)  . vancomycin 1,250 mg (07/19/19 2131)     LOS: 5 days      Time spent: 25 minutes   Dessa Phi, DO [facility] 07/20/2019, 9:22 AM   Available via Epic secure chat 7am-7pm After these hours, please refer to coverage provider listed on [email or web address]

## 2019-07-20 NOTE — Progress Notes (Signed)
Palliative Care Progress Note  Lindsay Little is doing much better today in terms of pain, no signs of opioid induced sedation. Her drowsiness early this AM and intermittently through the day can most likely be attributed to pain relief. She has not had to use her PCA at all today except this afternoon when she worked with PT. Overall she is engaged in her care and coping well. We spent time today talking about her cancer and survivorship.  Encouraged her to take one day at a time and the priority for now is pain control and antibiotics to treat the discitis. She has lumbar hardware which may be an issue moving forward.   Recommendations:  1. Stopped PCA, ordered PRN nurse administered dose in case she needs breakthrough medication, will transition to oral breakthrough on discharge.  2. Will plan on seeing her outpatient in cancer center in collaboration with Dr.[name] for ongoing symptom management and support.  3. Maintain Duragesic at current dose, gabapentin and celebrex.  Lane Hacker, DO Palliative Medicine [phone]  Time: 35 min Greater than 50%  of this time was spent counseling and coordinating care related to the above assessment and plan.

## 2019-07-20 NOTE — Progress Notes (Signed)
Patient is arousable, alert but disoriented with some periods of confusion. Saturating @100 , respiration 13, VS within normal limits.  MD notified, PCA pump is on. Will continue to monitor the patient.

## 2019-07-21 MED ORDER — CELECOXIB 200 MG PO CAPS: 200.0000 mg | ORAL_CAPSULE | Freq: Two times a day (BID) | ORAL | 0 refills | Status: AC

## 2019-07-21 MED ORDER — FENTANYL 100 MCG/HR TD PT72: 1.0000 | MEDICATED_PATCH | TRANSDERMAL | 0 refills | Status: DC

## 2019-07-21 MED ORDER — HEPARIN SOD (PORK) LOCK FLUSH 100 UNIT/ML IV SOLN
250.0000 [IU] | INTRAVENOUS | Status: DC | PRN
  Filled 2019-07-21: qty 2.5

## 2019-07-21 MED ORDER — HYDROCODONE-ACETAMINOPHEN 10-325 MG PO TABS: 1.0000 | ORAL_TABLET | Freq: Four times a day (QID) | ORAL | 0 refills | Status: DC | PRN

## 2019-07-21 MED ORDER — GABAPENTIN 400 MG PO CAPS: 800.0000 mg | ORAL_CAPSULE | Freq: Four times a day (QID) | ORAL | 0 refills | Status: DC

## 2019-07-21 NOTE — Progress Notes (Signed)
Physical Therapy Treatment Patient Details Name: Lindsay Little MRN: [id number] DOB: 1965/07/25 Today's Date: 07/21/2019    History of Present Illness 54yo female with history of discitis with symptoms since October 2020, getting progressively worse; she apparently had a PICC placed but did not use it due to high copays for the medications. Hypoxic in ED. MRI shows L3-L4 discitis osteomyelitis with progressive perivertebral inflammation and high grade thecal sac narrowing at L3-L4. PMH rectal CA, chemo induced neuropathy, colostomy placement, HTN, IBS, HLD, HTN, obesity, hx rotator cuff tear, lumbar surgery 2019, colon resection, knee arthroscopy, hernia repair    PT Comments    Pt performed gt training with in room and was able to mobilize to edge of bed and achieve standing.  She did receive break through pain medicine to tolerate session.  Performed as a co tx with OT due to poor pain tolerance.  She is ready to d/c home with assistance from her husband and her two children.      Follow Up Recommendations  Home health PT;Supervision/Assistance - 24 hour     Equipment Recommendations  Other (comment)(TBD)    Recommendations for Other Services       Precautions / Restrictions Precautions Precautions: Fall;Back;Other (comment) Precaution Booklet Issued: No Precaution Comments: severe back pain Restrictions Weight Bearing Restrictions: No    Mobility  Bed Mobility Overal bed mobility: Needs Assistance Bed Mobility: Rolling;Sidelying to Sit Rolling: Supervision Sidelying to sit: Supervision     Sit to sidelying: Min assist General bed mobility comments: assist for vc for log rolling technique, bed rails down to simulate home bed setup  Transfers Overall transfer level: Needs assistance Equipment used: Rolling walker (2 wheeled) Transfers: Sit to/from Stand Sit to Stand: Min guard;Min assist(min guard from elevated bed and min assistance from commode)         General  transfer comment: minA from lower surface;  cues for hand placement to use rail on L in bathroom.  Ambulation/Gait Ambulation/Gait assistance: Supervision Gait Distance (Feet): 12 Feet(x2) Assistive device: Rolling walker (2 wheeled) Gait Pattern/deviations: Step-through pattern;Wide base of support     General Gait Details: Ambulated with in room to bathroom and back.  Pt required cues for safety when negotiating tight spaces.,   Stairs             Wheelchair Mobility    Modified Rankin (Stroke Patients Only)       Balance Overall balance assessment: Needs assistance Sitting-balance support: Feet supported Sitting balance-Leahy Scale: Good Sitting balance - Comments: pain increases with sitting, able to tolerate sitting on the commode and in recliner for LB dressing about 28mn total   Standing balance support: Bilateral upper extremity supported;During functional activity;Single extremity supported Standing balance-Leahy Scale: Fair Standing balance comment: BUE support with ambulation;able to stabilize with single UE while donning LB dressing                             Cognition Arousal/Alertness: Awake/alert Behavior During Therapy: WFL for tasks assessed/performed Overall Cognitive Status: Within Functional Limits for tasks assessed                                        Exercises General Exercises - Lower Extremity Ankle Circles/Pumps: AROM;Both;20 reps;Supine Quad Sets: AROM;Both;Supine Gluteal Sets: AROM;Both;10 reps;Supine Heel Slides: AROM;Both;10 reps;Supine;Limitations Heel Slides Limitations: limited ROM due  to pain. Other Exercises Other Exercises: abdominal sets 1x10 reps.    General Comments        Pertinent Vitals/Pain Pain Assessment: Faces Pain Score: 8  Faces Pain Scale: Hurts even more Pain Location: back and hips with movement Pain Descriptors / Indicators: Grimacing;Guarding Pain Intervention(s):  Monitored during session;Repositioned    Home Living                      Prior Function            PT Goals (current goals can now be found in the care plan section) Acute Rehab PT Goals Patient Stated Goal: to continue to get out of bed at home Potential to Achieve Goals: Good Progress towards PT goals: Progressing toward goals    Frequency    Min 3X/week      PT Plan Current plan remains appropriate    Co-evaluation   Reason for Co-Treatment: (pt would not tolerate two sessions due to pain.)   OT goals addressed during session: ADL's and self-care      AM-PAC PT "6 Clicks" Mobility   Outcome Measure  Help needed turning from your back to your side while in a flat bed without using bedrails?: A Little Help needed moving from lying on your back to sitting on the side of a flat bed without using bedrails?: A Little Help needed moving to and from a bed to a chair (including a wheelchair)?: A Little Help needed standing up from a chair using your arms (e.g., wheelchair or bedside chair)?: A Little Help needed to walk in hospital room?: A Little Help needed climbing 3-5 steps with a railing? : A Little 6 Click Score: 18    End of Session Equipment Utilized During Treatment: Gait belt Activity Tolerance: Patient limited by pain Patient left: in bed;with call bell/phone within reach;with bed alarm set Nurse Communication: Patient requests pain meds PT Visit Diagnosis: Pain;Difficulty in walking, not elsewhere classified (R26.2) Pain - Right/Left: Left Pain - part of body: Hip     Time: 8185-9093 PT Time Calculation (min) (ACUTE ONLY): 33 min  Charges:  $Gait Training: 8-22 mins                     Erasmo Leventhal , PTA Acute Rehabilitation Services Pager [phone] Office [phone]     Ivannia Willhelm Eli Hose 07/21/2019, 1:19 PM

## 2019-07-21 NOTE — Progress Notes (Signed)
Occupational Therapy Treatment Patient Details Name: Lindsay Little MRN: [id number] DOB: [birth year] Today's Date: 07/21/2019    History of present illness 54yo female with history of discitis with symptoms since October 2020, getting progressively worse; she apparently had a PICC placed but did not use it due to high copays for the medications. Hypoxic in ED. MRI shows L3-L4 discitis osteomyelitis with progressive perivertebral inflammation and high grade thecal sac narrowing at L3-L4. PMH rectal CA, chemo induced neuropathy, colostomy placement, HTN, IBS, HLD, HTN, obesity, hx rotator cuff tear, lumbar surgery 2019, colon resection, knee arthroscopy, hernia repair   OT comments  Pt continues to progress toward established OT goals. Pt currently required minA for toileting transfer, totalA for posterior pericare, and minA for LB dressing without AE. Pt reports she is independent with AE for LB dressing. Pt will continue to benefit from skilled OT services to maximize safety and independence with ADL/IADL and functional mobility. Pt with anticipated d/c this date. Should pt remain in acute care, will continue to follow.     Follow Up Recommendations  Home health OT;Supervision/Assistance - 24 hour    Equipment Recommendations  3 in 1 bedside commode;Other (comment)(bari)    Recommendations for Other Services      Precautions / Restrictions Precautions Precautions: Fall;Back;Other (comment) Precaution Booklet Issued: No Precaution Comments: severe back pain Restrictions Weight Bearing Restrictions: No       Mobility Bed Mobility Overal bed mobility: Needs Assistance Bed Mobility: Sit to Sidelying;Rolling Rolling: Supervision       Sit to sidelying: Min assist General bed mobility comments: assist for vc for log rolling technique, bed rails down to simulate home bed setup  Transfers Overall transfer level: Needs assistance Equipment used: Rolling walker (2  wheeled) Transfers: Sit to/from Stand Sit to Stand: Min assist         General transfer comment: minA from lower surface;    Balance Overall balance assessment: Needs assistance Sitting-balance support: Feet supported Sitting balance-Leahy Scale: Good Sitting balance - Comments: pain increases with sitting, able to tolerate sitting on the commode and in recliner for LB dressing about 70mn total   Standing balance support: Bilateral upper extremity supported;During functional activity;Single extremity supported Standing balance-Leahy Scale: Fair Standing balance comment: BUE support with ambulation;able to stabilize with single UE while donning LB dressing                            ADL either performed or assessed with clinical judgement   ADL Overall ADL's : Needs assistance/impaired                     Lower Body Dressing: Minimal assistance;Sit to/from stand Lower Body Dressing Details (indicate cue type and reason): assist to don LB over feet, but pt reports independence with use of AE to assist  Toilet Transfer: Minimal aAgricultural engineerDetails (indicate cue type and reason): minA to ambulate to commode and minA sit<>stand from lower commode with use of grab bar;vc for DME management sideways into bathroom Toileting- Clothing Manipulation and Hygiene: Minimal assistance;Moderate assistance;Sit to/from stand Toileting - Clothing Manipulation Details (indicate cue type and reason): assist to wipe buttocks from therapist;minA for perineal care     Functional mobility during ADLs: Min guard;Rolling walker General ADL Comments: minguard for functional mobiltiy, vc for negotiation of RW into narrow bathroom     Vision       Perception  Praxis      Cognition Arousal/Alertness: Awake/alert Behavior During Therapy: WFL for tasks assessed/performed Overall Cognitive Status: Within Functional Limits for tasks  assessed                                          Exercises     Shoulder Instructions       General Comments      Pertinent Vitals/ Pain       Pain Assessment: Faces Faces Pain Scale: Hurts even more Pain Location: back and hips with movement Pain Descriptors / Indicators: Grimacing;Guarding Pain Intervention(s): Monitored during session;Limited activity within patient's tolerance  Home Living                                          Prior Functioning/Environment              Frequency  Min 3X/week        Progress Toward Goals  OT Goals(current goals can now be found in the care plan section)  Progress towards OT goals: Progressing toward goals  Acute Rehab OT Goals Patient Stated Goal: to continue to get out of bed at home OT Goal Formulation: With patient Time For Goal Achievement: 07/30/19 Potential to Achieve Goals: Good ADL Goals Pt Will Perform Upper Body Dressing: with modified independence;sitting Pt Will Perform Lower Body Dressing: with min guard assist;sit to/from stand;with adaptive equipment Pt Will Transfer to Toilet: with min guard assist;bedside commode;ambulating Pt Will Perform Toileting - Clothing Manipulation and hygiene: with min guard assist;sit to/from stand;sitting/lateral leans Additional ADL Goal #1: Pt will perform bed mobility using log roll technique with Min A in preparation for ADLs  Plan Discharge plan remains appropriate    Co-evaluation    PT/OT/SLP Co-Evaluation/Treatment: Yes Reason for Co-Treatment: For patient/therapist safety;To address functional/ADL transfers   OT goals addressed during session: ADL's and self-care      AM-PAC OT "6 Clicks" Daily Activity     Outcome Measure   Help from another person eating meals?: A Little Help from another person taking care of personal grooming?: A Little Help from another person toileting, which includes using toliet, bedpan, or  urinal?: A Little Help from another person bathing (including washing, rinsing, drying)?: A Little Help from another person to put on and taking off regular upper body clothing?: A Little Help from another person to put on and taking off regular lower body clothing?: A Lot 6 Click Score: 17    End of Session Equipment Utilized During Treatment: Rolling walker  OT Visit Diagnosis: Unsteadiness on feet (R26.81);Other abnormalities of gait and mobility (R26.89);Muscle weakness (generalized) (M62.81);Pain Pain - part of body: (back)   Activity Tolerance Patient tolerated treatment well   Patient Left in bed;with call bell/phone within reach   Nurse Communication Mobility status        Time: 5361-4431 OT Time Calculation (min): 16 min  Charges: OT General Charges $OT Visit: 1 Visit OT Treatments $Self Care/Home Management : 8-22 mins  Dorinda Hill OTR/L [place] Office: [place] 07/21/2019, 11:13 AM

## 2019-07-21 NOTE — Progress Notes (Signed)
Discharge instructions given to patient and husband. All medications and discharge instructions reviewed. All questions answered. IV out. Discharging home.

## 2019-07-21 NOTE — Discharge Summary (Addendum)
Physician Discharge Summary  KEMI GELL ZOX:[id number] DOB: 11-Jul-1966 DOA: 07/14/2019  PCP: [name], MD  Admit date: 07/14/2019 Discharge date: 07/21/2019  Admitted From: Home Disposition:  Home with home health   Recommendations for Outpatient Follow-up:  1. Follow up with PCP in 1 week 2. Follow up with Dr. Burr Medico as scheduled 07/23/2019 and outpatient palliative care 3. Follow up with referral for pain specialist, Dr. Maryjean Ka 4. Follow up with infectious disease, Dr. Tommy Medal 08/03/2019   Discharge Condition: Stable CODE STATUS: Full code Diet recommendation: Heart healthy  Brief/Interim Summary: Dorinne Graeff Humbertis a 54 y.o.femalewith medical history significant ofdiscitis, rectal adenocarcinomas/pradiation,s/pcolostomy, chronic pain. Patient presented secondary to worsening back pain with leg weakness in setting of known L3-4 discitis. Neurosurgery consulted and recommended no surgical management.  She was managed with IV vancomycin, ceftriaxone.  PICC line placed and home health arranged for home IV antibiotic for 6 weeks.  Palliative care also consulted for pain management.  Patient was weaned off PCA pump and prescribed pain regimen at discharge.  Discharge Diagnoses:  Principal Problem:   Discitis Active Problems:   Pure hypercholesterolemia   Morbid obesity with body mass index of 40.0-49.9 (HCC)   Rectal adenocarcinoma s/p LAR resection 05/25/2015   Hypertension   Anemia of chronic disease   Chronic pain syndrome   L3-L4 Discitis -Patient was being treated for discitis with oral antibiotics as an outpatient with worsening symptoms now requiring IV antibiotics. Neurosurgery consulted and recommended no surgical management -Blood cultures (12/31) no growth to date -ID recommendations: Vancomycin and Ceftriaxone x 6 weeks  -Follow up in ID office 08/03/2019  -PT/OT rec home health  -PICC line placed 07/20/2019  Chronic pain syndrome -Patient was on  morphine IR q6 hours as an outpatient -Palliative care consultedfor pain management --> fentanyl patch, gabapentin, celebrex, cymbalta, hydrocodone for breakthrough prn  -Currently being managed by PCP as well as Dr. Burr Medico.  Outpatient referral to Dr. Maryjean Ka in process  Essential hypertension -Continue Bystolic  Hyperlipidemia -Continue Crestor  History of rectal adenocarcinoma -S/p LAR resection, permanent colostomy  Morbid obesity -Estimated body mass index is 47.4 kg/m as calculated from the following:   Height as of this encounter: 5' 10.5" (1.791 m).   Weight as of this encounter: 152 kg.   Discharge Instructions  Discharge Instructions    Call MD for:  difficulty breathing, headache or visual disturbances   Complete by: As directed    Call MD for:  extreme fatigue   Complete by: As directed    Call MD for:  persistant dizziness or light-headedness   Complete by: As directed    Call MD for:  persistant nausea and vomiting   Complete by: As directed    Call MD for:  severe uncontrolled pain   Complete by: As directed    Call MD for:  temperature >100.4   Complete by: As directed    Diet - low sodium heart healthy   Complete by: As directed    Discharge instructions   Complete by: As directed    You were cared for by a hospitalist during your hospital stay. If you have any questions about your discharge medications or the care you received while you were in the hospital after you are discharged, you can call the unit and ask to speak with the hospitalist on call if the hospitalist that took care of you is not available. Once you are discharged, your primary care physician will handle any further medical  issues. Please note that NO REFILLS for any discharge medications will be authorized once you are discharged, as it is imperative that you return to your primary care physician (or establish a relationship with a primary care physician if you do not have one) for your  aftercare needs so that they can reassess your need for medications and monitor your lab values.   Home infusion instructions   Complete by: As directed    Instructions: Flushing of vascular access device: 0.9% NaCl pre/post medication administration and prn patency; Heparin 100 u/ml, 71m for implanted ports and Heparin 10u/ml, 540mfor all other central venous catheters.   Increase activity slowly   Complete by: As directed      Allergies as of 07/21/2019      Reactions   Caine-1 [lidocaine] Swelling, Rash   Eyes swell shut; includes all caine drugs except marcaine. EMLA cream OK though (?!)   Sulfa Antibiotics Nausea And Vomiting, Rash   Adhesive [tape] Other (See Comments)   Blisters - can use paper tape   Doxycycline Nausea Only   Flagyl [metronidazole] Nausea Only   Iron Nausea Only   Oxycodone Other (See Comments)   NIGHTMARES. (tolerates hydrocodone or tramadol better)   Penicillins Rash   Has patient had a PCN reaction causing immediate rash, facial/tongue/throat swelling, SOB or lightheadedness with hypotension: no Has patient had a PCN reaction causing severe rash involving mucus membranes or skin necrosis: no Has patient had a PCN reaction that required hospitalization no Has patient had a PCN reaction occurring within the last 10 years: no If all of the above answers are "NO", then may proceed with Cephalosporin use.      Medication List    STOP taking these medications   cyclobenzaprine 10 MG tablet Commonly known as: FLEXERIL   doxycycline 100 MG tablet Commonly known as: VIBRA-TABS   levofloxacin 750 MG tablet Commonly known as: Levaquin   meloxicam 15 MG tablet Commonly known as: MOBIC   morphine 30 MG tablet Commonly known as: MSIR     TAKE these medications   cefTRIAXone  IVPB Commonly known as: ROCEPHIN Inject 2 g into the vein daily. Indication:  Diskitis Last Day of Therapy:  08/25/19 Labs - Once weekly:  CBC/D and BMP, Labs - Every other week:   ESR and CRP   celecoxib 200 MG capsule Commonly known as: CELEBREX Take 1 capsule (200 mg total) by mouth 2 (two) times daily.   CoQ10 200 MG Caps Take 200 mg by mouth at bedtime.   DULoxetine 60 MG capsule Commonly known as: CYMBALTA Take 120 mg by mouth at bedtime.   esomeprazole 40 MG capsule Commonly known as: NEXIUM Take 1 capsule (40 mg total) by mouth 2 (two) times daily before a meal.   fentaNYL 100 MCG/HR Commonly known as: DUHampton patch onto the skin every 3 (three) days. Start taking on: July 22, 2019   furosemide 20 MG tablet Commonly known as: LASIX Take 20 mg by mouth daily as needed for edema.   gabapentin 400 MG capsule Commonly known as: NEURONTIN Take 2 capsules (800 mg total) by mouth 4 (four) times daily. What changed:   medication strength  how much to take   HYDROcodone-acetaminophen 10-325 MG tablet Commonly known as: NORCO Take 1 tablet by mouth every 6 (six) hours as needed for severe pain. What changed:   how much to take  reasons to take this   Melatonin 3 MG Tabs Take 3-6 mg  by mouth at bedtime.   multivitamin with minerals Tabs tablet Take 2 tablets by mouth at bedtime.   nebivolol 10 MG tablet Commonly known as: BYSTOLIC Take 10 mg by mouth at bedtime.   polyethylene glycol 17 g packet Commonly known as: MIRALAX / GLYCOLAX Take 17 g by mouth daily as needed for moderate constipation.   rosuvastatin 5 MG tablet Commonly known as: CRESTOR Take 5 mg by mouth at bedtime.   triamcinolone lotion 0.1 % Commonly known as: KENALOG Apply 1 application topically as needed (dry skin).   vancomycin  IVPB Inject 1,250 mg into the vein every 12 (twelve) hours. Indication:  Diskitis Last Day of Therapy:  08/25/19 Labs - Sunday/Monday:  CBC/D, BMP, and vancomycin trough. Labs - Thursday:  BMP and vancomycin trough Labs - Every other week:  ESR and CRP   Vitamin D3 125 MCG (5000 UT) Caps Take 5,000 Units by mouth at  bedtime.            Home Infusion Instuctions  (From admission, onward)         Start     Ordered   07/19/19 0000  Home infusion instructions    Question:  Instructions  Answer:  Flushing of vascular access device: 0.9% NaCl pre/post medication administration and prn patency; Heparin 100 u/ml, 56m for implanted ports and Heparin 10u/ml, 534mfor all other central venous catheters.   07/19/19 1625         Follow-up Information    [facility] [place] [place] [facility].   Specialty: HoJasperhy: The home health agency will contact you for the first home visit Contact information: [phone]        WhHarlan StainsMD. Schedule an appointment as soon as possible for a visit in 1 week(s).   Specialty: Family Medicine Contact information: [address] [phone]      FeTruitt MerleMD. Go on 07/23/2019.   Specialties: Hematology, Oncology Contact information: [phone]        VaTommy MedalCoLavell IslamMD. Go on 08/03/2019.   Specialty: Infectious Diseases Contact information: [phone]        Allergies  Allergen Reactions  . Caine-1 [Lidocaine] Swelling and Rash    Eyes swell shut; includes all caine drugs except marcaine. EMLA cream OK though (?!)  . Sulfa Antibiotics Nausea And Vomiting and Rash  . Adhesive [Tape] Other (See Comments)    Blisters - can use paper tape  . Doxycycline Nausea Only  . Flagyl [Metronidazole] Nausea Only  . Iron Nausea Only  . Oxycodone Other (See Comments)    NIGHTMARES. (tolerates hydrocodone or tramadol better)  . Penicillins Rash    Has patient had a PCN reaction causing immediate rash, facial/tongue/throat swelling, SOB or lightheadedness with hypotension: no Has patient had a PCN reaction causing severe rash involving mucus  membranes or skin necrosis: no Has patient had a PCN reaction that required hospitalization no Has patient had a PCN reaction occurring within the last 10 years: no If all of the above answers are "NO", then may proceed with Cephalosporin use.     Consultations:  Palliative care  Infectious disease  Neurosurgery  Oncology    Procedures/Studies: MR LUMBAR SPINE WO CONTRAST  Result Date: 07/15/2019 CLINICAL DATA:  Low back pain with progressive neurologic deficit  EXAM: MRI LUMBAR SPINE WITHOUT CONTRAST TECHNIQUE: Multiplanar, multisequence MR imaging of the lumbar spine was performed. No intravenous contrast was administered. COMPARISON:  06/26/2019 FINDINGS: Truncated study due to patient pain. Only sagittal imaging was acquired, without contrast. Redemonstrated changes of discitis osteomyelitis at L3-4 with disc space purulence and endplate destruction. The disc space appears taller and more T2 hyperintense. There is paraspinal inflammation extending into the bilateral psoas which is progressed based on sagittal STIR imaging. No evidence of dorsal epidural abscess. T2 hyperintense material ventral to the L4 body follows fat signal and is unchanged. There is L3-4 high-grade spinal stenosis from degenerative posterior element hypertrophy, disc bulging, and epidural fat expansion. Pre-existing L4-5 PLIF without evident solid fusion by recent abdominal CT IMPRESSION: 1. Truncated noncontrast study due to patient pain. 2. L3-4 discitis osteomyelitis with progressive paravertebral inflammation/phlegmon compared to 06/26/2019. Intervertebral widening and purulence has likely also progressed. 3. High-grade thecal sac narrowing at L3-4/L4 attributed to degenerative changes and epidural fat expansion. No change in epidural soft tissues to imply interval abscess. Electronically Signed   By: Monte Fantasia M.D.   On: 07/15/2019 06:54   MR Lumbar Spine W Wo Contrast  Result Date: 06/26/2019 CLINICAL  DATA:  Low back pain with bilateral hip and leg pain. L4-5 fusion October 2019. Abnormal CT. Rule out infection or metastatic disease. EXAM: MRI LUMBAR SPINE WITHOUT AND WITH CONTRAST TECHNIQUE: Multiplanar and multiecho pulse sequences of the lumbar spine were obtained without and with intravenous contrast. CONTRAST:  58m GADAVIST GADOBUTROL 1 MMOL/ML IV SOLN COMPARISON:  CT abdomen pelvis 06/18/2019.  Lumbar MRI 04/10/2018 FINDINGS: Segmentation:  Normal Alignment:  Slight anterolisthesis L4-5. Vertebrae: Extensive bone marrow edema at L3-4. There is irregularity of the endplates. The disc shows increased signal on inversion recovery and mild enhancement. Bone marrow shows diffuse enhancement. Multiple endplate defects are noted on CT which have relatively well-defined margins. Bone marrow edema and enhancement was not seen at this level on the MRI of 04/10/2018 Negative for fracture.  No mass lesion identified. Conus medullaris and cauda equina: Conus extends to the L1 level. Conus and cauda equina appear normal. Paraspinal and other soft tissues: Mild paraspinous soft tissue thickening and enhancement at the L3-4 level. In addition, there is ventral epidural thickening and enhancement at this level without abscess. No psoas abscess identified. Disc levels: L1-2: Mild facet degeneration bilaterally.  Negative for stenosis L2-3: Diffuse bulging of the disc. Mild facet degeneration and mild spinal stenosis. L3-4: Endplate irregularity with diffuse bone marrow edema and enhancement at L3 and L4. Sclerotic changes in the L4 vertebral body on CT. Bilateral facet hypertrophy. Moderate spinal stenosis. L4-5: Pedicle screw and interbody fusion.  Negative for stenosis L5-S1: Negative IMPRESSION: Extensive abnormality at L3-4 with bone marrow edema and enhancement as well as extensive endplate irregularity. There is paraspinous soft tissue thickening and enhancement as well as epidural thickening and enhancement. Findings  are most likely due to chronic infection. Another possibility would be severe adjacent segment degeneration above the level of fusion at L4-5. Metastatic disease not considered likely. Electronically Signed   By: CFranchot GalloM.D.   On: 06/26/2019 19:08   IR PICC PLACEMENT RIGHT >5 YRS INC IMG GUIDE  Result Date: 07/01/2019 INDICATION: Lumbar discitis. Need for central venous access for long-term IV antibiotics. EXAM: ULTRASOUND AND FLUOROSCOPIC GUIDED PICC LINE INSERTION MEDICATIONS: 1% tetracaine 3 mL CONTRAST:  None FLUOROSCOPY TIME:  12 seconds COMPLICATIONS: None immediate. TECHNIQUE: The procedure, risks, benefits, and alternatives were explained  to the patient and informed written consent was obtained. A timeout was performed prior to the initiation of the procedure. The right upper extremity was prepped with chlorhexidine in a sterile fashion, and a sterile drape was applied covering the operative field. Maximum barrier sterile technique with sterile gowns and gloves were used for the procedure. A timeout was performed prior to the initiation of the procedure. Local anesthesia was provided with 1% tetracaine. After the overlying soft tissues were anesthetized, a micropuncture kit was utilized to access the right basilic vein. Real-time ultrasound guidance was utilized for vascular access including the acquisition of a permanent ultrasound image documenting patency of the accessed vessel. A guidewire was advanced to the level of the superior caval-atrial junction for measurement purposes and the PICC line was cut to length. A peel-away sheath was placed and a 46 cm, 5 [place], single lumen was inserted to level of the superior caval-atrial junction. A post procedure spot fluoroscopic was obtained. The catheter easily aspirated and flushed and was secured in place. A dressing was placed. The patient tolerated the procedure well without immediate post procedural complication. FINDINGS: After catheter  placement, the tip lies within the superior cavoatrial junction. The catheter aspirates and flushes normally and is ready for immediate use. IMPRESSION: Successful ultrasound and fluoroscopic guided placement of a right basilic vein approach, 46 cm, 5 French, single lumen PICC with tip at the superior caval-atrial junction. The PICC line is ready for immediate use. Read by: Gareth [place], PA-C Electronically Signed   By: Aletta Edouard M.D.   On: 07/01/2019 09:20   [place] EKG SITE RITE  Result Date: 07/19/2019 If Site Rite image not attached, placement could not be confirmed due to current cardiac rhythm.  IR LUMBAR DISC ASPIRATION W/IMG GUIDE  Result Date: 07/02/2019 INDICATION: Severe low back pain secondary to lumbar discitis at L3-L4. EXAM: DISC ASPIRATION AT L3-L4 MEDICATIONS: The patient is currently admitted to the hospital and receiving intravenous antibiotics. The antibiotics were administered within an appropriate time frame prior to the initiation of the procedure. ANESTHESIA/SEDATION: Fentanyl 3 mcg IV; Versed 75 mg IV.  Dilaudid 1 mg IV. Moderate Sedation Time:  29 minutes The patient was continuously monitored during the procedure by the interventional radiology nurse under my direct supervision. COMPLICATIONS: None immediate. PROCEDURE: Informed written consent was obtained from the patient after a thorough discussion of the procedural risks, benefits and alternatives. All questions were addressed. Maximal Sterile Barrier Technique was utilized including caps, mask, sterile gowns, sterile gloves, sterile drape, hand hygiene and skin antiseptic. A timeout was performed prior to the initiation of the procedure. The patient was laid prone on the fluoroscopic table. The skin overlying the lumbar region was then prepped and draped in the usual sterile fashion. The right paramedian approach to the L3-L4 disc space was then infiltrated with tetracaine local anesthetic. Using biplane intermittent  fluoroscopy, a 21 gauge Franseen needle was then advanced into the L3-L4 disc space with crossing of the midline. Two passes were made with two separate needles. A combined approximately 2 mL of bloody aspirate was obtained and sent for microbiologic analysis. Hemostasis was achieved at the skin entry site. IMPRESSION: Status post fluoro guided disc aspiration at L3-L4 x 2 as described above without event. Electronically Signed   By: Luanne Bras M.D.   On: 07/01/2019 11:24       Discharge Exam: Vitals:   07/21/19 0451 07/21/19 0904  BP: (!) 147/63 (!) 152/65  Pulse: 62 (!) 57  Resp: 16 18  Temp: 98 F (36.7 C) 97.7 F (36.5 C)  SpO2: 98% 96%     General: Pt is alert, awake, not in acute distress Cardiovascular: RRR, S1/S2 +, no edema Respiratory: CTA bilaterally, no wheezing, no rhonchi, no respiratory distress, no conversational dyspnea  Abdominal: Soft, NT, ND, bowel sounds + Extremities: no edema, no cyanosis Psych: Normal mood and affect, stable judgement and insight     The results of significant diagnostics from this hospitalization (including imaging, microbiology, ancillary and laboratory) are listed below for reference.     Microbiology: Recent Results (from the past 240 hour(s))  Blood culture (routine x 2)     Status: None   Collection Time: 07/15/19  1:45 AM   Specimen: BLOOD  Result Value Ref Range Status   Specimen Description BLOOD RIGHT ARM  Final   Special Requests   Final    BOTTLES DRAWN AEROBIC AND ANAEROBIC Blood Culture results may not be optimal due to an excessive volume of blood received in culture bottles   Culture   Final    NO GROWTH 5 DAYS Performed at [facility], [place] [address] [place], [place] [address]    Report Status 07/20/2019 FINAL  Final  Blood culture (routine x 2)     Status: None   Collection Time: 07/15/19  1:51 AM   Specimen: BLOOD  Result Value Ref Range Status   Specimen Description BLOOD LEFT HAND  Final    Special Requests   Final    BOTTLES DRAWN AEROBIC ONLY Blood Culture adequate volume   Culture   Final    NO GROWTH 5 DAYS Performed at [facility], [place] [address] [place], [place] [address]    Report Status 07/20/2019 FINAL  Final  SARS CORONAVIRUS 2 (TAT 6-24 HRS) Nasopharyngeal Nasopharyngeal Swab     Status: None   Collection Time: 07/15/19  7:02 AM   Specimen: Nasopharyngeal Swab  Result Value Ref Range Status   SARS Coronavirus 2 NEGATIVE NEGATIVE Final    Comment: (NOTE) SARS-CoV-2 target nucleic acids are NOT DETECTED. The SARS-CoV-2 RNA is generally detectable in upper and lower respiratory specimens during the acute phase of infection. Negative results do not preclude SARS-CoV-2 infection, do not rule out co-infections with other pathogens, and should not be used as the sole basis for treatment or other patient management decisions. Negative results must be combined with clinical observations, patient history, and epidemiological information. The expected result is Negative. Fact Sheet for Patients: SugarRoll.be Fact Sheet for Healthcare Providers: https://www.woods-mathews.com/ This test is not yet approved or cleared by the [place] FDA and  has been authorized for detection and/or diagnosis of SARS-CoV-2 by FDA under an Emergency Use Authorization (EUA). This EUA will remain  in effect (meaning this test can be used) for the duration of the COVID-19 declaration under Section 56 4(b)(1) of the Act, 21 U.S.C. section 360bbb-3(b)(1), unless the authorization is terminated or revoked sooner. Performed at [facility], [place] [address] [place], [place] [address]   Urine culture     Status: None   Collection Time: 07/15/19  8:45 AM   Specimen: Urine, Clean Catch  Result Value Ref Range Status   Specimen Description URINE, CLEAN CATCH  Final   Special Requests NONE  Final   Culture   Final    NO  GROWTH Performed at [facility], [place] [address], [place], [place] [address]    Report Status 07/16/2019  FINAL  Final     Labs: BNP (last 3 results) No results for input(s): BNP in the last 8760 hours. Basic Metabolic Panel: Recent Labs  Lab 07/15/19 0154 07/16/19 0219 07/19/19 1034  NA 138 137 140  K 3.9 3.9 4.2  CL 101 101 100  CO2 _0 GLUCOSE 119* 100* 96  BUN _1 CREATININE 0.52 0.60 0.47  CALCIUM 9.9 9.6 9.6   Liver Function Tests: Recent Labs  Lab 07/15/19 0154  AST 12*  ALT 11  ALKPHOS 75  BILITOT 0.8  PROT 7.1  ALBUMIN 3.4*   No results for input(s): LIPASE, AMYLASE in the last 168 hours. No results for input(s): AMMONIA in the last 168 hours. CBC: Recent Labs  Lab 07/15/19 0154 07/16/19 0219  WBC 9.6 8.1  NEUTROABS 8.3*  --   HGB 11.3* 11.1*  HCT 35.8* 35.9*  MCV 86.9 88.9  PLT 313 292   Cardiac Enzymes: No results for input(s): CKTOTAL, CKMB, CKMBINDEX, TROPONINI in the last 168 hours. BNP: Invalid input(s): POCBNP CBG: No results for input(s): GLUCAP in the last 168 hours. D-Dimer No results for input(s): DDIMER in the last 72 hours. Hgb A1c No results for input(s): HGBA1C in the last 72 hours. Lipid Profile No results for input(s): CHOL, HDL, LDLCALC, TRIG, CHOLHDL, LDLDIRECT in the last 72 hours. Thyroid function studies No results for input(s): TSH, T4TOTAL, T3FREE, THYROIDAB in the last 72 hours.  Invalid input(s): FREET3 Anemia work up No results for input(s): VITAMINB12, FOLATE, FERRITIN, TIBC, IRON, RETICCTPCT in the last 72 hours. Urinalysis    Component Value Date/Time   COLORURINE YELLOW 07/15/2019 0211   APPEARANCEUR HAZY (A) 07/15/2019 0211   LABSPEC 1.009 07/15/2019 0211   LABSPEC 1.010 02/26/2016 1604   PHURINE 9.0 (H) 07/15/2019 0211   GLUCOSEU NEGATIVE 07/15/2019 0211   GLUCOSEU Negative 02/26/2016 1604   HGBUR SMALL (A) 07/15/2019 0211   BILIRUBINUR NEGATIVE 07/15/2019 0211   BILIRUBINUR  Negative 02/26/2016 1604   KETONESUR 5 (A) 07/15/2019 0211   PROTEINUR NEGATIVE 07/15/2019 0211   UROBILINOGEN 0.2 02/26/2016 1604   NITRITE NEGATIVE 07/15/2019 0211   LEUKOCYTESUR MODERATE (A) 07/15/2019 0211   LEUKOCYTESUR Trace 02/26/2016 1604   Sepsis Labs Invalid input(s): PROCALCITONIN,  WBC,  LACTICIDVEN Microbiology Recent Results (from the past 240 hour(s))  Blood culture (routine x 2)     Status: None   Collection Time: 07/15/19  1:45 AM   Specimen: BLOOD  Result Value Ref Range Status   Specimen Description BLOOD RIGHT ARM  Final   Special Requests   Final    BOTTLES DRAWN AEROBIC AND ANAEROBIC Blood Culture results may not be optimal due to an excessive volume of blood received in culture bottles   Culture   Final    NO GROWTH 5 DAYS Performed at [facility], [place] [address], [place], [place] [address]    Report Status 07/20/2019 FINAL  Final  Blood culture (routine x 2)     Status: None   Collection Time: 07/15/19  1:51 AM   Specimen: BLOOD  Result Value Ref Range Status   Specimen Description BLOOD LEFT HAND  Final   Special Requests   Final    BOTTLES DRAWN AEROBIC ONLY Blood Culture adequate volume   Culture   Final    NO GROWTH 5 DAYS Performed at [facility], [place] [address], [place], [place] [address]    Report Status 07/20/2019 FINAL  Final  SARS CORONAVIRUS 2 (TAT 6-24 HRS) Nasopharyngeal Nasopharyngeal Swab     Status: None   Collection Time: 07/15/19  7:02 AM   Specimen: Nasopharyngeal Swab  Result Value Ref Range Status   SARS Coronavirus 2 NEGATIVE NEGATIVE Final    Comment: (NOTE) SARS-CoV-2 target nucleic acids are NOT DETECTED. The SARS-CoV-2 RNA is generally detectable in upper and lower respiratory specimens during the acute phase of infection. Negative results do not preclude SARS-CoV-2 infection, do not rule out co-infections with other pathogens, and should not be used as the sole basis for treatment or other patient  management decisions. Negative results must be combined with clinical observations, patient history, and epidemiological information. The expected result is Negative. Fact Sheet for Patients: SugarRoll.be Fact Sheet for Healthcare Providers: https://www.woods-mathews.com/ This test is not yet approved or cleared by the [place] FDA and  has been authorized for detection and/or diagnosis of SARS-CoV-2 by FDA under an Emergency Use Authorization (EUA). This EUA will remain  in effect (meaning this test can be used) for the duration of the COVID-19 declaration under Section 56 4(b)(1) of the Act, 21 U.S.C. section 360bbb-3(b)(1), unless the authorization is terminated or revoked sooner. Performed at [facility], [place] [address], [place], [place] [address]   Urine culture     Status: None   Collection Time: 07/15/19  8:45 AM   Specimen: Urine, Clean Catch  Result Value Ref Range Status   Specimen Description URINE, CLEAN CATCH  Final   Special Requests NONE  Final   Culture   Final    NO GROWTH Performed at [facility], [place] [address], [name], [place] [address]    Report Status 07/16/2019 FINAL  Final     Patient was seen and examined on the day of discharge and was found to be in stable condition. Time coordinating discharge: 35 minutes including assessment and coordination of care, as well as examination of the patient.   SIGNED:  Dessa Phi, DO [facility] 07/21/2019, 9:21 AM

## 2019-07-23 ENCOUNTER — Telehealth: Payer: Self-pay

## 2019-07-23 ENCOUNTER — Inpatient Hospital Stay: Payer: 59 | Admitting: Hematology

## 2019-07-23 ENCOUNTER — Other Ambulatory Visit: Payer: Self-pay | Admitting: Radiation Therapy

## 2019-07-23 ENCOUNTER — Telehealth: Payer: Self-pay | Admitting: Hematology

## 2019-07-23 ENCOUNTER — Inpatient Hospital Stay: Payer: 59

## 2019-07-23 NOTE — Telephone Encounter (Signed)
I spoke with Lindsay Little. I let her know Dr. Burr Medico is rescheduling her appts for 1 month.  She verbalized understanding.

## 2019-07-23 NOTE — Telephone Encounter (Signed)
Scheduled appt per 1/8 sch message - pt aware of appt date and time

## 2019-07-26 DIAGNOSIS — M4646 Discitis, unspecified, lumbar region: Secondary | ICD-10-CM | POA: Diagnosis present

## 2019-07-27 ENCOUNTER — Other Ambulatory Visit (HOSPITAL_COMMUNITY)
Admission: RE | Admit: 2019-07-27 | Discharge: 2019-07-27 | Disposition: A | Discharge status: Home / Self Care | Payer: 59 | Source: Skilled Nursing Facility | Attending: Internal Medicine | Admitting: Internal Medicine

## 2019-07-27 DIAGNOSIS — M4646 Discitis, unspecified, lumbar region: Secondary | ICD-10-CM | POA: Diagnosis not present

## 2019-07-27 LAB — CBC WITH DIFFERENTIAL/PLATELET
Abs Immature Granulocytes: 0.03 10*3/uL (ref 0.00–0.07)
Basophils Absolute: 0.1 10*3/uL (ref 0.0–0.1)
Basophils Relative: 1 %
Eosinophils Absolute: 0.4 10*3/uL (ref 0.0–0.5)
Eosinophils Relative: 5 %
HCT: 34.8 % — ABNORMAL LOW (ref 36.0–46.0)
Hemoglobin: 10.4 g/dL — ABNORMAL LOW (ref 12.0–15.0)
Immature Granulocytes: 0 %
Lymphocytes Relative: 18 %
Lymphs Abs: 1.3 10*3/uL (ref 0.7–4.0)
MCH: 27.1 pg (ref 26.0–34.0)
MCHC: 29.9 g/dL — ABNORMAL LOW (ref 30.0–36.0)
MCV: 90.6 fL (ref 80.0–100.0)
Monocytes Absolute: 0.5 10*3/uL (ref 0.1–1.0)
Monocytes Relative: 6 %
Neutro Abs: 4.9 10*3/uL (ref 1.7–7.7)
Neutrophils Relative %: 70 %
Platelets: 271 10*3/uL (ref 150–400)
RBC: 3.84 MIL/uL — ABNORMAL LOW (ref 3.87–5.11)
RDW: 13.9 % (ref 11.5–15.5)
WBC: 7.1 10*3/uL (ref 4.0–10.5)
nRBC: 0 % (ref 0.0–0.2)

## 2019-07-27 LAB — SEDIMENTATION RATE: Sed Rate: 40 mm/h — ABNORMAL HIGH (ref 0–22)

## 2019-07-27 LAB — BASIC METABOLIC PANEL
Anion gap: 5 (ref 5–15)
BUN: 14 mg/dL (ref 6–20)
CO2: 31 mmol/L (ref 22–32)
Calcium: 8.6 mg/dL — ABNORMAL LOW (ref 8.9–10.3)
Chloride: 104 mmol/L (ref 98–111)
Creatinine, Ser: 0.53 mg/dL (ref 0.44–1.00)
GFR calc Af Amer: >60 mL/min (ref >60)
GFR calc non Af Amer: >60 mL/min (ref >60)
Glucose, Bld: 126 mg/dL — ABNORMAL HIGH (ref 70–99)
Potassium: 3.9 mmol/L (ref 3.5–5.1)
Sodium: 140 mmol/L (ref 135–145)

## 2019-07-27 LAB — C-REACTIVE PROTEIN: CRP: 2.2 mg/dL — ABNORMAL HIGH (ref <1.0)

## 2019-07-27 LAB — VANCOMYCIN, TROUGH: Vancomycin Tr: 10 ug/mL — ABNORMAL LOW (ref 15–20)

## 2019-07-29 LAB — FUNGUS CULTURE WITH STAIN

## 2019-07-29 LAB — FUNGUS CULTURE RESULT

## 2019-07-29 LAB — FUNGAL ORGANISM REFLEX

## 2019-07-30 ENCOUNTER — Other Ambulatory Visit (HOSPITAL_COMMUNITY)
Admission: RE | Admit: 2019-07-30 | Discharge: 2019-07-30 | Disposition: A | Discharge status: Home / Self Care | Payer: 59 | Source: Skilled Nursing Facility | Attending: Internal Medicine | Admitting: Internal Medicine

## 2019-07-30 DIAGNOSIS — M4646 Discitis, unspecified, lumbar region: Secondary | ICD-10-CM | POA: Insufficient documentation

## 2019-07-30 DIAGNOSIS — M4640 Discitis, unspecified, site unspecified: Secondary | ICD-10-CM | POA: Diagnosis present

## 2019-07-30 LAB — BASIC METABOLIC PANEL
Anion gap: 8 (ref 5–15)
BUN: 13 mg/dL (ref 6–20)
CO2: 27 mmol/L (ref 22–32)
Calcium: 8.8 mg/dL — ABNORMAL LOW (ref 8.9–10.3)
Chloride: 103 mmol/L (ref 98–111)
Creatinine, Ser: 0.49 mg/dL (ref 0.44–1.00)
GFR calc Af Amer: >60 mL/min (ref >60)
GFR calc non Af Amer: >60 mL/min (ref >60)
Glucose, Bld: 78 mg/dL (ref 70–99)
Potassium: 3.9 mmol/L (ref 3.5–5.1)
Sodium: 138 mmol/L (ref 135–145)

## 2019-07-30 LAB — VANCOMYCIN, TROUGH: Vancomycin Tr: 14 ug/mL — ABNORMAL LOW (ref 15–20)

## 2019-08-02 ENCOUNTER — Other Ambulatory Visit (HOSPITAL_COMMUNITY)
Admission: RE | Admit: 2019-08-02 | Discharge: 2019-08-02 | Disposition: A | Discharge status: Home / Self Care | Payer: 59 | Source: Other Acute Inpatient Hospital | Attending: Internal Medicine | Admitting: Internal Medicine

## 2019-08-02 ENCOUNTER — Telehealth: Payer: Self-pay

## 2019-08-02 DIAGNOSIS — M4640 Discitis, unspecified, site unspecified: Secondary | ICD-10-CM | POA: Diagnosis not present

## 2019-08-02 LAB — CBC WITH DIFFERENTIAL/PLATELET
Abs Immature Granulocytes: 0.01 10*3/uL (ref 0.00–0.07)
Basophils Absolute: 0.1 10*3/uL (ref 0.0–0.1)
Basophils Relative: 1 %
Eosinophils Absolute: 0.5 10*3/uL (ref 0.0–0.5)
Eosinophils Relative: 10 %
HCT: 37.6 % (ref 36.0–46.0)
Hemoglobin: 11.3 g/dL — ABNORMAL LOW (ref 12.0–15.0)
Immature Granulocytes: 0 %
Lymphocytes Relative: 22 %
Lymphs Abs: 1 10*3/uL (ref 0.7–4.0)
MCH: 27.3 pg (ref 26.0–34.0)
MCHC: 30.1 g/dL (ref 30.0–36.0)
MCV: 90.8 fL (ref 80.0–100.0)
Monocytes Absolute: 0.4 10*3/uL (ref 0.1–1.0)
Monocytes Relative: 8 %
Neutro Abs: 2.6 10*3/uL (ref 1.7–7.7)
Neutrophils Relative %: 59 %
Platelets: 208 10*3/uL (ref 150–400)
RBC: 4.14 MIL/uL (ref 3.87–5.11)
RDW: 13.6 % (ref 11.5–15.5)
WBC: 4.5 10*3/uL (ref 4.0–10.5)
nRBC: 0 % (ref 0.0–0.2)

## 2019-08-02 LAB — BASIC METABOLIC PANEL
Anion gap: 8 (ref 5–15)
BUN: 11 mg/dL (ref 6–20)
CO2: 28 mmol/L (ref 22–32)
Calcium: 9.2 mg/dL (ref 8.9–10.3)
Chloride: 104 mmol/L (ref 98–111)
Creatinine, Ser: 0.54 mg/dL (ref 0.44–1.00)
GFR calc Af Amer: >60 mL/min (ref >60)
GFR calc non Af Amer: >60 mL/min (ref >60)
Glucose, Bld: 115 mg/dL — ABNORMAL HIGH (ref 70–99)
Potassium: 4.4 mmol/L (ref 3.5–5.1)
Sodium: 140 mmol/L (ref 135–145)

## 2019-08-02 LAB — SEDIMENTATION RATE: Sed Rate: 34 mm/hr — ABNORMAL HIGH (ref 0–22)

## 2019-08-02 LAB — VANCOMYCIN, TROUGH: Vancomycin Tr: 14 ug/mL — ABNORMAL LOW (ref 15–20)

## 2019-08-02 NOTE — Telephone Encounter (Signed)
COVID-19 Pre-Screening Questions:08/02/19  Do you currently have a fever (>100 F), chills or unexplained body aches? NO   . Are you currently experiencing new cough, shortness of breath, sore throat, runny nose? NO .  Have you recently travelled outside the state of [place] in the last 14 days? NO .  Have you been in contact with someone that is currently pending confirmation of Covid19 testing or has been confirmed to have the [place] virus?  NO  **If the patient answers NO to ALL questions -  advise the patient to please call the clinic before coming to the office should any symptoms develop.

## 2019-08-03 ENCOUNTER — Encounter: Payer: Self-pay | Admitting: Infectious Disease

## 2019-08-03 ENCOUNTER — Other Ambulatory Visit: Payer: Self-pay

## 2019-08-03 ENCOUNTER — Ambulatory Visit (INDEPENDENT_AMBULATORY_CARE_PROVIDER_SITE_OTHER): Payer: 59 | Admitting: Infectious Disease

## 2019-08-03 ENCOUNTER — Telehealth: Payer: Self-pay | Admitting: *Deleted

## 2019-08-03 VITALS — BP 130/69 | HR 94 | Temp 97.6°F | Resp 12 | Ht 70.5 in | Wt 330.0 lb

## 2019-08-03 DIAGNOSIS — T847XXD Infection and inflammatory reaction due to other internal orthopedic prosthetic devices, implants and grafts, subsequent encounter: Secondary | ICD-10-CM | POA: Diagnosis not present

## 2019-08-03 DIAGNOSIS — Z981 Arthrodesis status: Secondary | ICD-10-CM

## 2019-08-03 DIAGNOSIS — T847XXA Infection and inflammatory reaction due to other internal orthopedic prosthetic devices, implants and grafts, initial encounter: Secondary | ICD-10-CM

## 2019-08-03 DIAGNOSIS — L0291 Cutaneous abscess, unspecified: Secondary | ICD-10-CM

## 2019-08-03 DIAGNOSIS — C20 Malignant neoplasm of rectum: Secondary | ICD-10-CM | POA: Diagnosis not present

## 2019-08-03 DIAGNOSIS — M4646 Discitis, unspecified, lumbar region: Secondary | ICD-10-CM | POA: Diagnosis not present

## 2019-08-03 HISTORY — DX: Infection and inflammatory reaction due to other internal orthopedic prosthetic devices, implants and grafts, initial encounter: T84.7XXA

## 2019-08-03 LAB — MISC LABCORP TEST (SEND OUT): Labcorp test code: 6627

## 2019-08-03 MED ORDER — CEFDINIR 300 MG PO CAPS: 300.0000 mg | ORAL_CAPSULE | Freq: Two times a day (BID) | ORAL | 11 refills | Status: DC

## 2019-08-03 MED ORDER — DOXYCYCLINE HYCLATE 100 MG PO TABS: 100.0000 mg | ORAL_TABLET | Freq: Two times a day (BID) | ORAL | 11 refills | Status: DC

## 2019-08-03 NOTE — Telephone Encounter (Signed)
Patient brought paperwork to her appointment, Dr Tommy Medal completed and gave back to her. Copy made for her chart. Landis Gandy, RN

## 2019-08-03 NOTE — Progress Notes (Signed)
Subjective:   Chief complaint lower back pain with worsening pain in the right leg versus left leg but pain is overall better versus when she was hospitalized   [name], female    DOB: [birth year], 54 y.o.   MRN: [id number]  HPI    54 y.o. female w hx of rectal cancer sp surgery, XRT, chemotherapy with course complicated by intraabdominal abscess recently diagnosed with Lumbar diskitis sp IR guided aspirate with cultures being unrevealing. We arranged for her to get PICC and to start IV ceftriaxone and vancomycin but her out of pocket cost per week was $1k per week and this was felt prohibitive. Therefore IV antibiotics were abandoned and she was placed on bio-available doxycycline and levaquin. However pain has worsened and she returned to ER where MRi shows worsening diskitis and formation of phlegmon.  I saw her in the hospital we initiated vancomycin and ceftriaxone.  Turns out that they are going to be able to cover this and they already have a lot of out of acute expenses that were incurred while she was hospitalized.  Of note she does have hardware in the L4-L5 area and have some concern whether this may be infected as well.  She was seen by neurosurgery while in the hospital and also palliative care.  She has subsequently had improvement in her lower back pain particularly the on the longer the component which radiates into her left leg.  Right leg though still hurts a significant amount.  She is not been working due to severe pain which is impaired her ability to dress herself bathe herself and perform various ADLs.  Normally her job does consist some work from home but even that is not possible at present.   Past Medical History:  Diagnosis Date  . Anxiety   . Chemotherapy-induced neuropathy (HCC)    toes and fingers numbness and tingling  . Colorectal delayed anastomotic leak s/p resection & colostomy 07/12/2016 11/17/2015  . Colostomy in place [facility])  07/12/2016   due to anastomosis breakdown w/ colovaginal fistula  . Colovaginal fistula s/p omentopexy repair 07/12/2016 07/10/2016  . Complication of anesthesia   . Depression   . Family history of adverse reaction to anesthesia    mother-- ponv  . Family history of breast cancer   . Family history of pancreatic cancer   . Family history of skin cancer   . Gastroenteritis 12/20/2015  . GERD (gastroesophageal reflux disease)   . Hardware complicating wound infection ([place]) 08/03/2019  . Hiatal hernia   . History of cancer chemotherapy 02-20-2015 to 03-30-2015  . History of cardiac murmur as a child   . History of chemotherapy   . History of chronic gastritis   . History of hypertension    no issues since multiple abdminal sx's and chemo--- no medication since 12/ 2016  . History of TMJ disorder   . Hypercholesteremia   . Hypertension   . IBS (irritable bowel syndrome)    dx age 37  . Intermittent abdominal pain    post-op multiple abdominal sx's   . Microcytic anemia   . Mild sleep apnea    per pt study 2014  very mild osa , no cpap recommended, recommeded wt loss and sleep routine  . OA (osteoarthritis)    left knee /  left shoulder  . Obesity   . PONV (postoperative nausea and vomiting)    severe" needs Scopolamine PATCH"   . Portacath in place  right chest  . Rectal adenocarcinoma ([place]) oncologis-  dr Burr Medico-- after radiation/ chemo (ypT1, N0) --  no recurrence per last note 03/ 2018   dx 01-13-2015-- Stage IIIC (T3, N2, M0) post concurrent radiation and chemotherapy 02-20-2015 to 03-30-2015 /  05-25-2015 s/p  LAR w/ RSO (post-op complicated by late abscess and contained anatomotic leak w/ help percutaneous drainage and antibiotics)   . Rotator cuff tear, left   . S/P radiation therapy 02/20/15-03/30/15   colon/rectal  . Vitamin D deficiency   . Wears glasses   . Wears glasses     Past Surgical History:  Procedure Laterality Date  . ABDOMINAL HYSTERECTOMY  1996   uterus  and cervix  . BACK SURGERY  02/12/2018   lumbar surgery  . COLON RESECTION N/A 07/12/2016   Procedure: LAPAROSCOPIC LYSIS OF ADHESIONS, OMENTOPEXY, HAND ASSISTED RESECTION OF  COLON, END TO END ANASTOMOSIS, COLOSTOMY;  Surgeon: Michael Boston, MD;  Location: [facility];  Service: General;  Laterality: N/A;  . COMBINED HYSTEROSCOPY DIAGNOSTIC / D&C  x2 1990's  . DIAGNOSTIC LAPAROSCOPY  age 14 and age 77  . EUS N/A 01/18/2015   Procedure: LOWER ENDOSCOPIC ULTRASOUND (EUS);  Surgeon: Arta Silence, MD;  Location: [facility];  Service: Endoscopy;  Laterality: N/A;  . EXCISION OF SKIN TAG  11/17/2015   Procedure: EXCISION OF SKIN TAG;  Surgeon: Michael Boston, MD;  Location: [facility];  Service: General;;  . ILEO LOOP COLOSTOMY CLOSURE N/A 11/17/2015   Procedure: LAPAROSCOPIC DIVERTING LOOP ILEOSTOMY  DRAINAGE OF PELVIC ABSCESS;  Surgeon: Michael Boston, MD;  Location: [facility];  Service: General;  Laterality: N/A;  . ILEOSTOMY CLOSURE N/A 05/31/2016   Procedure: TAKEDOWN LOOP ILEOSTOMY;  Surgeon: Michael Boston, MD;  Location: [facility];  Service: General;  Laterality: N/A;  . IMPACTION REMOVAL  11/17/2015   Procedure: DISIMPACTION REMOVAL;  Surgeon: Michael Boston, MD;  Location: [facility];  Service: General;;  . INSERTION OF MESH N/A 07/23/2018   Procedure: INSERTION OF MESH X2;  Surgeon: Michael Boston, MD;  Location: [facility];  Service: General;  Laterality: N/A;  . IR LUMBAR [place] W/IMG GUIDE  07/01/2019  . KNEE ARTHROSCOPY Left 1990's  . KNEE ARTHROSCOPY W/ MENISCECTOMY Left 09/14/2009   and chondroplasty debridement  . LAPAROSCOPIC LYSIS OF ADHESIONS  11/17/2015   Procedure: LAPAROSCOPIC LYSIS OF ADHESIONS;  Surgeon: Michael Boston, MD;  Location: [facility];  Service: General;;  . LUMBAR WOUND DEBRIDEMENT N/A 04/24/2018   Procedure: wound exploration, irrigation and debridement;  Surgeon: Eustace Moore, MD;  Location: [facility];  Service: Neurosurgery;  Laterality: N/A;  wound exploration, irrigation and debridement  .  LYSIS OF ADHESION N/A 07/23/2018   Procedure: LYSIS OF ADHESIONS;  Surgeon: Michael Boston, MD;  [facility];  Service: General;  Laterality: N/A;  . PORT-A-CATH REMOVAL N/A 11/21/2016   Procedure: REMOVAL PORT-A-CATH;  Surgeon: Michael Boston, MD;  Location: [facility];  Service: General;  Laterality: N/A;  . PORTACATH PLACEMENT N/A 05/25/2015   Procedure: INSERTION PORT-A-CATH;  Surgeon: Michael Boston, MD;  Location: [facility];  Service: General;  Laterality: N/A;-remains inplace Right chest.  . ROTATOR CUFF REPAIR Right 2006  . TONSILLECTOMY  age 24  . VENTRAL HERNIA REPAIR N/A 07/23/2018   Procedure: LAPAROSCOPIC VENTRAL WALL HERNIA REPAIR;  Surgeon: Michael Boston, MD;  [facility];  Service: General;  Laterality: N/A;  . XI ROBOTIC ASSISTED LOWER ANTERIOR RESECTION N/A 05/25/2015   Procedure: XI ROBOTIC ASSISTED LOWER ANTERIOR RESECTION, ,  RIGID PROCTOSCOPY, RIGHT OOPHORECTOMY;  Surgeon: Michael Boston, MD;  [facility];  Service: General;  Laterality: N/A;    Family History  Problem Relation Age of Onset  . Coronary artery disease Mother 48  . Hypertension Mother   . Hyperlipidemia Mother   . Diabetes Mellitus I Mother   . Coronary artery disease Father   . Hyperlipidemia Father   . Hypertension Father   . Cancer Sister        skin - non melanoma  . Hyperlipidemia Brother   . Cancer Maternal Uncle 69       pancreatic with mets to colon and prostate  . Cirrhosis Maternal Uncle   . Hypertension Maternal Grandmother   . Diabetes Mellitus I Maternal Grandmother   . Hyperlipidemia Maternal Grandmother   . CVA Maternal Grandmother   . Hypertension Maternal Grandfather   . Coronary artery disease Maternal Grandfather 65  . Hyperlipidemia Maternal Grandfather   . Coronary artery disease Paternal Grandmother   . Hypertension Paternal Grandmother   . Hyperlipidemia Paternal Grandmother   . Diabetes Mellitus I Paternal Grandmother   . Hypertension Paternal  Grandfather   . Hyperlipidemia Paternal Grandfather   . Coronary artery disease Paternal Grandfather       Social History   Socioeconomic History  . Marital status: Married    Spouse name: Not on file  . Number of children: Not on file  . Years of education: Not on file  . Highest education level: Not on file  Occupational History  . Not on file  Tobacco Use  . Smoking status: Former Smoker    Packs/day: 0.25    Years: 7.00    Pack years: 1.75    Types: Cigarettes    Quit date: 07/16/1991    Years since quitting: 28.0  . Smokeless tobacco: Never Used  Substance and Sexual Activity  . Alcohol use: No  . Drug use: Never  . Sexual activity: Yes    Birth control/protection: Surgical  Other Topics Concern  . Not on file  Social History Narrative   Tobacco Use: Cigarettes - Former Smoker   Alcohol: Yes, very rare, liquor.    No recreational drug use   Occupation: Head CMA @ [employer]   Marital Status: Married    Husband: Roselyn Reef Disabled   Children: 2 adopted kids [place]   Religion: [employer] [place]               Social Determinants of Health   Financial Resource Strain:   . Difficulty of Paying Living Expenses: Not on file  Food Insecurity:   . Worried About Charity fundraiser in the Last Year: Not on file  . Ran Out of Food in the Last Year: Not on file  Transportation Needs:   . Lack of Transportation (Medical): Not on file  . Lack of Transportation (Non-Medical): Not on file  Physical Activity:   . Days of Exercise per Week: Not on file  . Minutes of Exercise per Session: Not on file  Stress:   . Feeling of Stress : Not on file  Social Connections:   . Frequency of Communication with Friends and Family: Not on file  . Frequency of Social Gatherings with Friends and Family: Not on file  . Attends Religious Services: Not on file  . Active Member of Clubs or Organizations: Not on file  . Attends Archivist Meetings: Not on  file  . Marital Status: Not  on file    Allergies  Allergen Reactions  . Caine-1 [Lidocaine] Swelling and Rash    Eyes swell shut; includes all caine drugs except marcaine. EMLA cream OK though (?!)  . Sulfa Antibiotics Nausea And Vomiting and Rash  . Adhesive [Tape] Other (See Comments)    Blisters - can use paper tape  . Doxycycline Nausea Only  . Flagyl [Metronidazole] Nausea Only  . Iron Nausea Only  . Oxycodone Other (See Comments)    NIGHTMARES. (tolerates hydrocodone or tramadol better)  . Penicillins Rash    Has patient had a PCN reaction causing immediate rash, facial/tongue/throat swelling, SOB or lightheadedness with hypotension: no Has patient had a PCN reaction causing severe rash involving mucus membranes or skin necrosis: no Has patient had a PCN reaction that required hospitalization no Has patient had a PCN reaction occurring within the last 10 years: no If all of the above answers are "NO", then may proceed with Cephalosporin use.      Current Outpatient Medications:  .  cefdinir (OMNICEF) 300 MG capsule, Take 1 capsule (300 mg total) by mouth 2 (two) times daily., Disp: 60 capsule, Rfl: 11 .  cefTRIAXone (ROCEPHIN) IVPB, Inject 2 g into the vein daily. Indication:  Diskitis Last Day of Therapy:  08/25/19 Labs - Once weekly:  CBC/D and BMP, Labs - Every other week:  ESR and CRP, Disp: 37 Units, Rfl: 0 .  celecoxib (CELEBREX) 200 MG capsule, Take 1 capsule (200 mg total) by mouth 2 (two) times daily., Disp: 60 capsule, Rfl: 0 .  Cholecalciferol (VITAMIN D3) 5000 units CAPS, Take 5,000 Units by mouth at bedtime. , Disp: , Rfl:  .  Coenzyme Q10 (COQ10) 200 MG CAPS, Take 200 mg by mouth at bedtime., Disp: , Rfl:  .  doxycycline (VIBRA-TABS) 100 MG tablet, Take 1 tablet (100 mg total) by mouth 2 (two) times daily., Disp: 60 tablet, Rfl: 11 .  DULoxetine (CYMBALTA) 60 MG capsule, Take 120 mg by mouth at bedtime. , Disp: , Rfl:  .  esomeprazole (NEXIUM) 40 MG capsule, Take  1 capsule (40 mg total) by mouth 2 (two) times daily before a meal., Disp: 60 capsule, Rfl: 5 .  fentaNYL (DURAGESIC) 100 MCG/HR, Place 1 patch onto the skin every 3 (three) days., Disp: 10 patch, Rfl: 0 .  furosemide (LASIX) 20 MG tablet, Take 20 mg by mouth daily as needed for edema. , Disp: , Rfl:  .  gabapentin (NEURONTIN) 400 MG capsule, Take 2 capsules (800 mg total) by mouth 4 (four) times daily., Disp: 240 capsule, Rfl: 0 .  HYDROcodone-acetaminophen (NORCO) 10-325 MG tablet, Take 1 tablet by mouth every 6 (six) hours as needed for severe pain., Disp: 30 tablet, Rfl: 0 .  Melatonin 3 MG TABS, Take 3-6 mg by mouth at bedtime. , Disp: , Rfl:  .  Multiple Vitamin (MULTIVITAMIN WITH MINERALS) TABS tablet, Take 2 tablets by mouth at bedtime. , Disp: , Rfl:  .  nebivolol (BYSTOLIC) 10 MG tablet, Take 10 mg by mouth at bedtime. , Disp: , Rfl:  .  polyethylene glycol (MIRALAX / GLYCOLAX) packet, Take 17 g by mouth daily as needed for moderate constipation., Disp: , Rfl:  .  rosuvastatin (CRESTOR) 5 MG tablet, Take 5 mg by mouth at bedtime., Disp: , Rfl:  .  triamcinolone lotion (KENALOG) 0.1 %, Apply 1 application topically as needed (dry skin)., Disp: , Rfl:  .  vancomycin IVPB, Inject 1,250 mg into the vein every 12 (twelve)  hours. Indication:  Diskitis Last Day of Therapy:  08/25/19 Labs - Sunday/Monday:  CBC/D, BMP, and vancomycin trough. Labs - Thursday:  BMP and vancomycin trough Labs - Every other week:  ESR and CRP, Disp: 70 Units, Rfl: 0 No current facility-administered medications for this visit.  Facility-Administered Medications Ordered in Other Visits:  .  0.9 %  sodium chloride infusion, , Intravenous, Once, Truitt Merle, MD  Review of Systems  Constitutional: Negative for activity change, appetite change, chills, diaphoresis, fatigue, fever and unexpected weight change.  HENT: Negative for congestion, rhinorrhea, sinus pressure, sneezing, sore throat and trouble swallowing.   Eyes:  Negative for photophobia and visual disturbance.  Respiratory: Negative for cough, chest tightness, shortness of breath, wheezing and stridor.   Cardiovascular: Negative for chest pain, palpitations and leg swelling.  Gastrointestinal: Negative for abdominal distention, abdominal pain, anal bleeding, blood in stool, constipation, diarrhea, nausea and vomiting.  Genitourinary: Negative for difficulty urinating, dysuria, flank pain and hematuria.  Musculoskeletal: Positive for back pain. Negative for arthralgias, gait problem, joint swelling and myalgias.  Skin: Negative for color change, pallor, rash and wound.  Neurological: Negative for dizziness, tremors, weakness and light-headedness.  Hematological: Negative for adenopathy. Does not bruise/bleed easily.  Psychiatric/Behavioral: Negative for agitation, behavioral problems, confusion, decreased concentration, dysphoric mood and sleep disturbance.       Objective:   Physical Exam Constitutional:      General: She is not in acute distress.    Appearance: Normal appearance. She is well-developed. She is not ill-appearing or diaphoretic.  HENT:     Head: Normocephalic and atraumatic.     Right Ear: Hearing and external ear normal.     Left Ear: Hearing and external ear normal.     Nose: No nasal deformity or rhinorrhea.  Eyes:     General: No scleral icterus.    Conjunctiva/sclera: Conjunctivae normal.     Right eye: Right conjunctiva is not injected.     Left eye: Left conjunctiva is not injected.     Pupils: Pupils are equal, round, and reactive to light.  Neck:     Vascular: No JVD.  Cardiovascular:     Rate and Rhythm: Normal rate and regular rhythm.     Heart sounds: S1 normal and S2 normal.  Abdominal:     General: There is no distension.     Palpations: Abdomen is soft.  Musculoskeletal:        General: Normal range of motion.     Right shoulder: Normal.     Left shoulder: Normal.     Cervical back: Normal range of  motion and neck supple.     Right hip: Normal.     Left hip: Normal.     Right knee: Normal.     Left knee: Normal.  Lymphadenopathy:     Head:     Right side of head: No submandibular, preauricular or posterior auricular adenopathy.     Left side of head: No submandibular, preauricular or posterior auricular adenopathy.     Cervical: No cervical adenopathy.     Right cervical: No superficial or deep cervical adenopathy.    Left cervical: No superficial or deep cervical adenopathy.  Skin:    General: Skin is warm and dry.     Coloration: Skin is not pale.     Findings: No abrasion, bruising, ecchymosis, erythema, lesion or rash.     Nails: There is no clubbing.  Neurological:     Mental Status: She is  alert and oriented to person, place, and time.     Sensory: No sensory deficit.     Coordination: Coordination normal.     Gait: Gait normal.  Psychiatric:        Attention and Perception: She is attentive.        Mood and Affect: Mood normal.        Speech: Speech normal.        Behavior: Behavior normal. Behavior is cooperative.        Thought Content: Thought content normal.        Judgment: Judgment normal.    PICC line is clean dry and intact see picture August 03, 2019:         Assessment & Plan:   Lumbar discitis with vertebral osteomyelitis and epidural phlegmon with concern that the L4-L5 area of hardware could be involved  Complete vancomycin and ceftriaxone on February 10 as planned.  Then start cefdinir 300 mg twice daily and doxycycline 100 mg twice daily continue for least 6 months well following her clinical course and inflammatory markers.  Disability: Currently patient is unable to work given the severity of her lower back pain that is being managed with Norco but also at times needing fentanyl and in the past Dilaudid.  She has trouble even dressing herself or bathing herself and today came to clinic with a walker which she even had trouble moving about  with that and with her brace for her stabilization of her spine.  I have filled out FMLA paperwork for her  I spent greater than 40 minutes with the patient including greater than 50% of time in face to face counsel of the patient and her husband regarding her condition and reviewing her films which I have personally seen and show clear-cut discitis in the phlegmon near the area of her hardware and in coordination of her care

## 2019-08-05 ENCOUNTER — Other Ambulatory Visit (HOSPITAL_COMMUNITY)
Admission: RE | Admit: 2019-08-05 | Discharge: 2019-08-05 | Disposition: A | Discharge status: Home / Self Care | Payer: 59 | Source: Skilled Nursing Facility | Attending: Internal Medicine | Admitting: Internal Medicine

## 2019-08-05 DIAGNOSIS — M4640 Discitis, unspecified, site unspecified: Secondary | ICD-10-CM | POA: Diagnosis not present

## 2019-08-05 LAB — BASIC METABOLIC PANEL
Anion gap: 8 (ref 5–15)
BUN: 8 mg/dL (ref 6–20)
CO2: 30 mmol/L (ref 22–32)
Calcium: 9 mg/dL (ref 8.9–10.3)
Chloride: 103 mmol/L (ref 98–111)
Creatinine, Ser: 0.52 mg/dL (ref 0.44–1.00)
GFR calc Af Amer: >60 mL/min (ref >60)
GFR calc non Af Amer: >60 mL/min (ref >60)
Glucose, Bld: 125 mg/dL — ABNORMAL HIGH (ref 70–99)
Potassium: 4.7 mmol/L (ref 3.5–5.1)
Sodium: 141 mmol/L (ref 135–145)

## 2019-08-05 LAB — VANCOMYCIN, TROUGH: Vancomycin Tr: 17 ug/mL (ref 15–20)

## 2019-08-06 ENCOUNTER — Telehealth: Payer: Self-pay

## 2019-08-06 ENCOUNTER — Other Ambulatory Visit: Payer: Self-pay | Admitting: Hematology

## 2019-08-06 MED ORDER — HYDROCODONE-ACETAMINOPHEN 10-325 MG PO TABS: 1.0000 | ORAL_TABLET | Freq: Three times a day (TID) | ORAL | 0 refills | Status: DC | PRN

## 2019-08-06 NOTE — Telephone Encounter (Signed)
Ms Holcomb called requesting refill for norco 10/325.

## 2019-08-06 NOTE — Telephone Encounter (Signed)
I called pt and reviewed her pain meds, and refilled norco 10/325 #60, which will likely last a month.   My nurse, please refer her to pain clinic Dr. Maryjean Ka again.   Truitt Merle MD

## 2019-08-09 ENCOUNTER — Other Ambulatory Visit: Payer: Self-pay

## 2019-08-09 DIAGNOSIS — C20 Malignant neoplasm of rectum: Secondary | ICD-10-CM

## 2019-08-09 DIAGNOSIS — M4646 Discitis, unspecified, lumbar region: Secondary | ICD-10-CM

## 2019-08-09 NOTE — Telephone Encounter (Signed)
Refferal Faxed to Dr. Maryjean Ka at [facility] at [phone].

## 2019-08-11 ENCOUNTER — Other Ambulatory Visit (HOSPITAL_COMMUNITY)
Admission: RE | Admit: 2019-08-11 | Discharge: 2019-08-11 | Disposition: A | Discharge status: Home / Self Care | Payer: 59 | Source: Other Acute Inpatient Hospital | Attending: Internal Medicine | Admitting: Internal Medicine

## 2019-08-11 DIAGNOSIS — M4640 Discitis, unspecified, site unspecified: Secondary | ICD-10-CM | POA: Diagnosis present

## 2019-08-11 DIAGNOSIS — C218 Malignant neoplasm of overlapping sites of rectum, anus and anal canal: Secondary | ICD-10-CM | POA: Diagnosis present

## 2019-08-11 LAB — BASIC METABOLIC PANEL
Anion gap: 9 (ref 5–15)
BUN: 9 mg/dL (ref 6–20)
CO2: 30 mmol/L (ref 22–32)
Calcium: 9.2 mg/dL (ref 8.9–10.3)
Chloride: 105 mmol/L (ref 98–111)
Creatinine, Ser: 0.6 mg/dL (ref 0.44–1.00)
GFR calc Af Amer: >60 mL/min (ref >60)
GFR calc non Af Amer: >60 mL/min (ref >60)
Glucose, Bld: 119 mg/dL — ABNORMAL HIGH (ref 70–99)
Potassium: 4.3 mmol/L (ref 3.5–5.1)
Sodium: 144 mmol/L (ref 135–145)

## 2019-08-11 LAB — VANCOMYCIN, TROUGH: Vancomycin Tr: 23 ug/mL (ref 15–20)

## 2019-08-13 ENCOUNTER — Other Ambulatory Visit (HOSPITAL_COMMUNITY)
Admission: RE | Admit: 2019-08-13 | Discharge: 2019-08-13 | Disposition: A | Discharge status: Home / Self Care | Payer: 59 | Source: Other Acute Inpatient Hospital | Attending: Internal Medicine | Admitting: Internal Medicine

## 2019-08-13 ENCOUNTER — Telehealth: Payer: Self-pay

## 2019-08-13 ENCOUNTER — Other Ambulatory Visit: Payer: Self-pay | Admitting: Hematology

## 2019-08-13 DIAGNOSIS — C218 Malignant neoplasm of overlapping sites of rectum, anus and anal canal: Secondary | ICD-10-CM | POA: Diagnosis not present

## 2019-08-13 DIAGNOSIS — M4640 Discitis, unspecified, site unspecified: Secondary | ICD-10-CM | POA: Diagnosis not present

## 2019-08-13 LAB — CREATININE, SERUM
Creatinine, Ser: 0.59 mg/dL (ref 0.44–1.00)
GFR calc Af Amer: >60 mL/min (ref >60)
GFR calc non Af Amer: >60 mL/min (ref >60)

## 2019-08-13 LAB — VANCOMYCIN, TROUGH: Vancomycin Tr: 17 ug/mL (ref 15–20)

## 2019-08-13 LAB — BUN: BUN: 9 mg/dL (ref 6–20)

## 2019-08-13 MED ORDER — FENTANYL 100 MCG/HR TD PT72: 1.0000 | MEDICATED_PATCH | TRANSDERMAL | 0 refills | Status: DC

## 2019-08-13 NOTE — Telephone Encounter (Signed)
I will refill for 2 weeks, she may not be able to refill until 2/3, I will call in today   Truitt Merle MD

## 2019-08-13 NOTE — Telephone Encounter (Signed)
Ms Hedgepeth called requesting refill on fentanyl patches.  She has ov with Dr. Burr Medico on 2/10 but only has enough patches until 08/19/2019.  Referal for Dr Maryjean Ka was faxed to his office on 1/25/202.  I told Ms Mozingo to call Dr. Maryjean Ka office and make an appointment.  She verbalized understanding

## 2019-08-17 ENCOUNTER — Other Ambulatory Visit: Payer: Self-pay | Admitting: Hematology

## 2019-08-17 ENCOUNTER — Telehealth: Payer: Self-pay

## 2019-08-17 MED ORDER — FENTANYL 100 MCG/HR TD PT72: 1.0000 | MEDICATED_PATCH | TRANSDERMAL | 0 refills | Status: DC

## 2019-08-17 NOTE — Telephone Encounter (Signed)
I called in, let her pick up tomorrow. Will re-evaluate her pain meds on her next visit.   Truitt Merle MD

## 2019-08-17 NOTE — Telephone Encounter (Signed)
Pt called requesting that the duragesic prescription be sent to [facility] in [place].  She states she will need this Rx by 08/18/2019. I called [facility] and they had not yet filled the rx sent on 08/13/2019.  I requested that rx be cancelled.  I have placed [facility], [place] on the perferred pharmacy list in epic  I spoke with Dr. Maryjean Ka office.  Ms Olliff has been seen by Dr Ronnald Ramp for neurosurgery care.  The patient was last seen 04/22/2019. She did not have the recommended testing.   I reviewed our referral order for pain medicine and it has been denied.

## 2019-08-18 ENCOUNTER — Other Ambulatory Visit: Payer: Self-pay

## 2019-08-18 NOTE — Progress Notes (Signed)
[place]   [phone] [phone]   Clinic Follow up Note   Patient Care Team: [name], MD as PCP - General (Family Medicine) Jerline Pain, MD as PCP - Cardiology (Cardiology) Michael Boston, MD as Consulting Physician (General Surgery) Truitt Merle, MD as Consulting Physician (Medical Oncology) Kyung Rudd, MD as Consulting Physician (Radiation Oncology) Teena Irani, MD (Inactive) as Consulting Physician (Gastroenterology) Raynelle Bring, MD as Consulting Physician (Urology) Eustace Moore, MD as Consulting Physician (Neurosurgery)  Date of Service:  08/25/2019  CHIEF COMPLAINT:  F/u on rectal cancer  SUMMARY OF ONCOLOGIC HISTORY: Oncology History Overview Note  Rectal adenocarcinoma   Staging form: Colon and Rectum, AJCC 7th Edition     Clinical: T3, N2, M0 - Unsigned     Rectal adenocarcinoma s/p LAR resection 05/25/2015  01/13/2015 Initial Diagnosis   Rectal adenocarcinoma   01/13/2015 Procedure   Colonoscopy showed a sensitivity O nonobstructing mass in the rectum and from 12-18 cm proximal to the Annis. The mass was circumferential, measuring about 6 cm in length. EGD was negative.   01/13/2015 Cancer Staging   Staging form: Colon and Rectum, AJCC 7th Edition - Clinical stage from 01/13/2015: T3, N2, M0 - Signed by Truitt Merle, MD on 12/15/2017   01/17/2015 Tumor Marker   CEA 1.4, CA-19-9 8, MMR normal    01/18/2015 Procedure   Lower endoscopic ultrasound by Dr. Paulita Fujita showed a T3 N2 rectal mass.   01/20/2015 Imaging   CT abdomen and pelvis with contrast showed right lateral rectal wall exophytic mass, and 2 ill-defined hepatic hypoenhancing lesions which appears to correspond to the lesion seen on the prior MRI in 2015.   02/05/2015 Imaging   abdomen MRI showed 2 hemangioma, no suspicion for metastatic disease. CT chest was negative   02/20/2015 - 03/30/2015 Radiation Therapy   neoadjuvant RT to rectal cancer    02/20/2015 Concurrent  Chemotherapy   capecitabine 2500 mg in the morning and 2000 mg in the evening (831m/m2, bid), on the day of radiation.    05/25/2015 Surgery   Recto-sigmoid segmental resection, margins are negative    05/25/2015 Pathology Results   0.2cm residual invasive adenocarcinoma, G2, negative margins, 12 nodes were negative    05/25/2015 Cancer Staging   Staging form: Colon and Rectum, AJCC 7th Edition - Pathologic stage from 05/25/2015: Stage I (T1, N0, cM0) - Signed by FTruitt Merle MD on 12/15/2017   06/23/2015 - 06/29/2015 Hospital Admission   Patient was admitted for pelvic abscess, drain placed and she was treated with IV antibiotics, she also received 1 dose of Feraheme for anemia.   09/15/2015 - 09/17/2015 Hospital Admission   Recurrent pelvic collection s/p perc drainage 09/16/2015   02/21/2016 Imaging   CT abdomen and pelvis w contrast IMPRESSION: 1. Interval removal of surgical drains since 12/19/2015 from the presacral space. Similar size of ill-defined presacral fluid and gas, for which residual abscess cannot be excluded. Similar amount of intraperitoneal edema throughout the upper pelvis with foci of extraluminal gas, possibly related to the presacral process. 2. Diverting right-sided ileostomy, without acute complication. 3. Similar moderate hydroureteronephrosis, likely due to the pelvic process. 4. Possible bladder wall thickening. Correlate with urinalysis. This appearance could be partially due to underdistention. 5. Geographic hepatic steatosis and hemangiomas. Cannot exclude a new right hepatic lobe lesion. Consider nonemergent pre and post contrast outpatient MRI. This could represent a new area of focal steatosis.   05/31/2016 Surgery   ileostomy takedown BY Dr.  Gross    07/04/2016 Imaging   CT Abdomen Pelvis W Contrast 07/04/16 IMPRESSION: Subcutaneous fluid collection at the site of ileostomy takedown, could be sterile or infected, measuring 9.3 x 4.3 x 5.3 cm; this  could be aspirated under ultrasound guidance if clinically indicated. Large collection of stool within the presacral space, could potentially be within a distended distorted rectum but since this occupies the same position as the abscess collection identified on the previous CT this is suspicious for a contiguous contained extraluminal stool collection communicating with the rectosigmoid colon, collection overall roughly 7 x 8 x 9 cm.   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   12/26/2016 Imaging   IMPRESSION: 1. Stable CT exam of the chest.  No evidence for metastatic disease. 2. Interval left colectomy with left abdominal end colostomy. Presacral soft tissue is similar to prior study when taking into account the interval surgery. 3. Hepatic dome lesion seen on the previous study not evident today, potentially related to bolus timing. The 2.4 cm subcapsular inferior right liver lesion remains subtle and is barely visible on today's exam but shows no substantial interval change in size. Continued attention on follow-up recommended.    12/09/2017 Imaging   CT CAP W Contrast 12/09/17  IMPRESSION: 1. Stable post treatment changes in the presacral space with no evidence of recurrent disease in the pelvis. 2. No evidence of metastatic disease in the chest, abdomen or pelvis. 3. Large parastomal hernia has increased in size and contains much of the remnant left colon. No evidence of bowel obstruction or ischemia. 4. Stable dilated main pulmonary artery, suggesting chronic pulmonary arterial hypertension. 5.  Aortic Atherosclerosis (ICD10-I70.0).   12/31/2017 Genetic Testing   The Multi-Cancer Panel offered by [facility] includes sequencing and/or deletion duplication testing of the following 83 genes: ALK, APC, ATM, AXIN2,BAP1,  BARD1, BLM, BMPR1A, BRCA1, BRCA2, BRIP1, CASR, CDC73, CDH1, CDK4, CDKN1B, CDKN1C,  CDKN2A (p14ARF), CDKN2A (p16INK4a), CEBPA, CHEK2, CTNNA1, DICER1, DIS3L2, EGFR (c.2369C>T, p.Thr790Met variant only), EPCAM (Deletion/duplication testing only), FH, FLCN, GATA2, GPC3, GREM1 (Promoter region deletion/duplication testing only), HOXB13 (c.251G>A, p.Gly84Glu), HRAS, KIT, MAX, MEN1, MET, MITF (c.952G>A, p.Glu318Lys variant only), MLH1, MSH2, MSH3, MSH6, MUTYH, NBN, NF1, NF2, NTHL1, PALB2, PDGFRA, PHOX2B, PMS2, POLD1, POLE, POT1, PRKAR1A, PTCH1, PTEN, RAD50, RAD51C, RAD51D, RB1, RECQL4, RET, RUNX1, SDHAF2, SDHA (sequence changes only), SDHB, SDHC, SDHD, SMAD4, SMARCA4, SMARCB1, SMARCE1, STK11, SUFU, TERC, TERT, TMEM127, TP53, TSC1, TSC2, VHL, WRN and WT1.   Results: Negative, no pathogenic variants identified.  The date of this test report is 12/31/2017.    02/02/2019 Imaging   CT CAP W Contrast  IMPRESSION: 1. Status post descending colostomy, without recurrent or metastatic disease. 2. Presacral soft tissue thickening is similar in configuration. New tiny foci of gas within the presacral soft tissues, nonspecific. No well-defined fistulous communication to bowel identified. 3. Possible constipation. 4. Probable mild hepatic steatosis. 5. Interval repair of parastomal hernia.   Aortic Atherosclerosis (ICD10-I70.0).   06/18/2019 Imaging   CT AP W Contrast  IMPRESSION: 1. Stable post treatment changes in presacral region. No evidence of recurrent or metastatic carcinoma within the abdomen or pelvis. 2. New osteolysis involving the superior and inferior vertebral endplates at T6-5, suspicious for infectious discitis. Recommend clinical correlation, and consider lumbar spine MRI without and with contrast for further evaluation.   These results will be called to the ordering clinician or representative by the Radiologist Assistant, and communication documented  in the PACS or zVision Dashboard.    06/26/2019 Imaging    MRI Lumbar 06/26/19  IMPRESSION: Extensive abnormality at  L3-4 with bone marrow edema and enhancement as well as extensive endplate irregularity. There is paraspinous soft tissue thickening and enhancement as well as epidural thickening and enhancement. Findings are most likely due to chronic infection. Another possibility would be severe adjacent segment degeneration above the level of fusion at L4-5. Metastatic disease not considered likely.      CURRENT THERAPY:  Surveillance  INTERVAL HISTORY:  Lindsay Little is here for a follow up. She presents to the clinic with her husband. She notes she is ambulating with her walker. She notes Dr. Armandina Gemma was to see her as outpatient at some point in Cancer center. She plans to see Dr. Maryjean Ka in 09/14/2019. She notes her back pain is controlled at 6-7 on Fentanyl patch. She takes Norco 2-4 tabs a day as needed. She notes she is fatigued as well. She notes she completes her IV antibiotics. She takes last robaxin today and last vancomycin tomorrow. She sees home nurse twice a week. She plans to see Dr Tommy Medal in 09/2019.  She notes she is not eating as well before. She lost 20 pounds after hospitalization. She notes she has mainly been eating banana pudding. She tries to eat but nothing is appetizing.     REVIEW OF SYSTEMS:   Constitutional: Denies fevers, chills (+) weight loss, low appetite (+) Fatigue  Eyes: Denies blurriness of vision Ears, nose, mouth, throat, and face: Denies mucositis or sore throat Respiratory: Denies cough, dyspnea or wheezes Cardiovascular: Denies palpitation, chest discomfort or lower extremity swelling Gastrointestinal:  Denies nausea, heartburn or change in bowel habits Skin: Denies abnormal skin rashes MSK: (+) Radiating back pain 7/10 Lymphatics: Denies new lymphadenopathy or easy bruising Neurological:Denies numbness, tingling or new weaknesses Behavioral/Psych: Mood is stable, no new changes (+) Emotional  All other systems were reviewed with the patient and are  negative.  MEDICAL HISTORY:  Past Medical History:  Diagnosis Date  . Anxiety   . Chemotherapy-induced neuropathy (HCC)    toes and fingers numbness and tingling  . Colorectal delayed anastomotic leak s/p resection & colostomy 07/12/2016 11/17/2015  . Colostomy in place [facility] 07/12/2016   due to anastomosis breakdown w/ colovaginal fistula  . Colovaginal fistula s/p omentopexy repair 07/12/2016 07/10/2016  . Complication of anesthesia   . Depression   . Family history of adverse reaction to anesthesia    mother-- ponv  . Family history of breast cancer   . Family history of pancreatic cancer   . Family history of skin cancer   . Gastroenteritis 12/20/2015  . GERD (gastroesophageal reflux disease)   . Hardware complicating wound infection ([place]) 08/03/2019  . Hiatal hernia   . History of cancer chemotherapy 02-20-2015 to 03-30-2015  . History of cardiac murmur as a child   . History of chemotherapy   . History of chronic gastritis   . History of hypertension    no issues since multiple abdminal sx's and chemo--- no medication since 12/ 2016  . History of TMJ disorder   . Hypercholesteremia   . Hypertension   . IBS (irritable bowel syndrome)    dx age 73  . Intermittent abdominal pain    post-op multiple abdominal sx's   . Microcytic anemia   . Mild sleep apnea    per pt study 2014  very mild osa , no cpap recommended, recommeded wt loss  and sleep routine  . OA (osteoarthritis)    left knee /  left shoulder  . Obesity   . PONV (postoperative nausea and vomiting)    severe" needs Scopolamine PATCH"   . Portacath in place    right chest  . Rectal adenocarcinoma ([place]) oncologis-  dr Burr Medico-- after radiation/ chemo (ypT1, N0) --  no recurrence per last note 03/ 2018   dx 01-13-2015-- Stage IIIC (T3, N2, M0) post concurrent radiation and chemotherapy 02-20-2015 to 03-30-2015 /  05-25-2015 s/p  LAR w/ RSO (post-op complicated by late abscess and contained anatomotic leak w/ help  percutaneous drainage and antibiotics)   . Rotator cuff tear, left   . S/P radiation therapy 02/20/15-03/30/15   colon/rectal  . Vitamin D deficiency   . Wears glasses   . Wears glasses     SURGICAL HISTORY: Past Surgical History:  Procedure Laterality Date  . ABDOMINAL HYSTERECTOMY  1996   uterus and cervix  . BACK SURGERY  02/12/2018   lumbar surgery  . COLON RESECTION N/A 07/12/2016   Procedure: LAPAROSCOPIC LYSIS OF ADHESIONS, OMENTOPEXY, HAND ASSISTED RESECTION OF  COLON, END TO END ANASTOMOSIS, COLOSTOMY;  Surgeon: Michael Boston, MD;  Location: [facility];  Service: General;  Laterality: N/A;  . COMBINED HYSTEROSCOPY DIAGNOSTIC / D&C  x2 1990's  . DIAGNOSTIC LAPAROSCOPY  age 53 and age 56  . EUS N/A 01/18/2015   Procedure: LOWER ENDOSCOPIC ULTRASOUND (EUS);  Surgeon: Arta Silence, MD;  Location: [facility];  Service: Endoscopy;  Laterality: N/A;  . EXCISION OF SKIN TAG  11/17/2015   Procedure: EXCISION OF SKIN TAG;  Surgeon: Michael Boston, MD;  Location: [facility];  Service: General;;  . ILEO LOOP COLOSTOMY CLOSURE N/A 11/17/2015   Procedure: LAPAROSCOPIC DIVERTING LOOP ILEOSTOMY  DRAINAGE OF PELVIC ABSCESS;  Surgeon: Michael Boston, MD;  Location: [facility];  Service: General;  Laterality: N/A;  . ILEOSTOMY CLOSURE N/A 05/31/2016   Procedure: TAKEDOWN LOOP ILEOSTOMY;  Surgeon: Michael Boston, MD;  Location: [facility];  Service: General;  Laterality: N/A;  . IMPACTION REMOVAL  11/17/2015   Procedure: DISIMPACTION REMOVAL;  Surgeon: Michael Boston, MD;  Location: [facility];  Service: General;;  . INSERTION OF MESH N/A 07/23/2018   Procedure: INSERTION OF MESH X2;  Surgeon: Michael Boston, MD;  Location: [facility];  Service: General;  Laterality: N/A;  . IR LUMBAR [place] W/IMG GUIDE  07/01/2019  . KNEE ARTHROSCOPY Left 1990's  . KNEE ARTHROSCOPY W/ MENISCECTOMY Left 09/14/2009   and chondroplasty debridement  . LAPAROSCOPIC LYSIS OF ADHESIONS  11/17/2015   Procedure: LAPAROSCOPIC LYSIS OF ADHESIONS;   Surgeon: Michael Boston, MD;  Location: [facility];  Service: General;;  . LUMBAR WOUND DEBRIDEMENT N/A 04/24/2018   Procedure: wound exploration, irrigation and debridement;  Surgeon: Eustace Moore, MD;  Location: [place];  Service: Neurosurgery;  Laterality: N/A;  wound exploration, irrigation and debridement  . LYSIS OF ADHESION N/A 07/23/2018   Procedure: LYSIS OF ADHESIONS;  Surgeon: Michael Boston, MD;  Location: [facility];  Service: General;  Laterality: N/A;  . PORT-A-CATH REMOVAL N/A 11/21/2016   Procedure: REMOVAL PORT-A-CATH;  Surgeon: Michael Boston, MD;  Location: [facility];  Service: General;  Laterality: N/A;  . PORTACATH PLACEMENT N/A 05/25/2015   Procedure: INSERTION PORT-A-CATH;  Surgeon: Michael Boston, MD;  Location: [facility];  Service: General;  Laterality: N/A;-remains inplace Right chest.  . ROTATOR CUFF REPAIR Right 2006  . TONSILLECTOMY  age 71  . VENTRAL HERNIA REPAIR  N/A 07/23/2018   Procedure: LAPAROSCOPIC VENTRAL WALL HERNIA REPAIR;  Surgeon: Michael Boston, MD;  Location: [facility];  Service: General;  Laterality: N/A;  . XI ROBOTIC ASSISTED LOWER ANTERIOR RESECTION N/A 05/25/2015   Procedure: XI ROBOTIC ASSISTED LOWER ANTERIOR RESECTION, , RIGID PROCTOSCOPY, RIGHT OOPHORECTOMY;  Surgeon: Michael Boston, MD;  Location: [facility];  Service: General;  Laterality: N/A;    I have reviewed the social history and family history with the patient and they are unchanged from previous note.  ALLERGIES:  is allergic to caine-1 [lidocaine]; sulfa antibiotics; adhesive [tape]; doxycycline; flagyl [metronidazole]; iron; oxycodone; and penicillins.  MEDICATIONS:  Current Outpatient Medications  Medication Sig Dispense Refill  . celecoxib (CELEBREX) 200 MG capsule Take 1 capsule (200 mg total) by mouth 2 (two) times daily. 60 capsule 0  . bisoprolol (ZEBETA) 10 MG tablet Take 10 mg by mouth daily.    . cefdinir (OMNICEF) 300 MG capsule Take 1 capsule (300 mg total) by mouth 2 (two) times  daily. 60 capsule 11  . cefTRIAXone (ROCEPHIN) IVPB Inject 2 g into the vein daily. Indication:  Diskitis Last Day of Therapy:  08/25/19 Labs - Once weekly:  CBC/D and BMP, Labs - Every other week:  ESR and CRP 37 Units 0  . Cholecalciferol (VITAMIN D3) 5000 units CAPS Take 5,000 Units by mouth at bedtime.     . Coenzyme Q10 (COQ10) 200 MG CAPS Take 200 mg by mouth at bedtime.    . cyclobenzaprine (FLEXERIL) 10 MG tablet Take 10 mg by mouth 3 (three) times daily as needed.    . doxycycline (VIBRA-TABS) 100 MG tablet Take 1 tablet (100 mg total) by mouth 2 (two) times daily. 60 tablet 11  . DULoxetine (CYMBALTA) 60 MG capsule Take 120 mg by mouth at bedtime.     Marland Kitchen esomeprazole (NEXIUM) 40 MG capsule Take 1 capsule (40 mg total) by mouth 2 (two) times daily before a meal. 60 capsule 5  . fentaNYL (DURAGESIC) 100 MCG/HR Place 1 patch onto the skin every 3 (three) days. 10 patch 0  . furosemide (LASIX) 20 MG tablet Take 20 mg by mouth daily as needed for edema.     . gabapentin (NEURONTIN) 400 MG capsule Take 2 capsules (800 mg total) by mouth 4 (four) times daily. 240 capsule 0  . HYDROcodone-acetaminophen (NORCO) 10-325 MG tablet Take 1 tablet by mouth every 8 (eight) hours as needed for severe pain. 60 tablet 0  . Melatonin 3 MG TABS Take 3-6 mg by mouth at bedtime.     . Multiple Vitamin (MULTIVITAMIN WITH MINERALS) TABS tablet Take 2 tablets by mouth at bedtime.     . nebivolol (BYSTOLIC) 10 MG tablet Take 10 mg by mouth at bedtime.     . polyethylene glycol (MIRALAX / GLYCOLAX) packet Take 17 g by mouth daily as needed for moderate constipation.    . rosuvastatin (CRESTOR) 5 MG tablet Take 5 mg by mouth at bedtime.    . triamcinolone lotion (KENALOG) 0.1 % Apply 1 application topically as needed (dry skin).    . vancomycin IVPB Inject 1,250 mg into the vein every 12 (twelve) hours. Indication:  Diskitis Last Day of Therapy:  08/25/19 Labs - Sunday/Monday:  CBC/D, BMP, and vancomycin  trough. Labs - Thursday:  BMP and vancomycin trough Labs - Every other week:  ESR and CRP 70 Units 0   No current facility-administered medications for this visit.   Facility-Administered Medications Ordered in Other Visits  Medication Dose  Route Frequency Provider Last Rate Last Admin  . 0.9 %  sodium chloride infusion   Intravenous Once Truitt Merle, MD        PHYSICAL EXAMINATION: ECOG PERFORMANCE STATUS: 2 - Symptomatic, <50% confined to bed  Vitals:   08/25/19 1401  BP: (!) 153/103  Pulse: (!) 59  Resp: 20  Temp: 97.8 F (36.6 C)  SpO2: 100%   Filed Weights   08/25/19 1401  Weight: (!) 319 lb 3.2 oz (144.8 kg)    Due to COVID19 we will limit examination to appearance. Patient had no complaints.  GENERAL:alert, no distress and comfortable SKIN: skin color normal, no rashes or significant lesions EYES: normal, Conjunctiva are pink and non-injected, sclera clear  NEURO: alert & oriented x 3 with fluent speech  LABORATORY DATA:  I have reviewed the data as listed CBC Latest Ref Rng & Units 08/25/2019 08/23/2019 08/02/2019  WBC 4.0 - 10.5 K/uL 5.9 3.5(L) 4.5  Hemoglobin 12.0 - 15.0 g/dL 12.0 10.7(L) 11.3(L)  Hematocrit 36.0 - 46.0 % 38.2 36.2 37.6  Platelets 150 - 400 K/uL 227 190 208     CMP Latest Ref Rng & Units 08/25/2019 08/23/2019 08/21/2019  Glucose 70 - 99 mg/dL 108(H) 96 130(H)  BUN 6 - 20 mg/dL _0 Creatinine 0.44 - 1.00 mg/dL 0.77 0.65 0.69  Sodium 135 - 145 mmol/L 146(H) 141 141  Potassium 3.5 - 5.1 mmol/L 4.0 4.4 3.8  Chloride 98 - 111 mmol/L 104 103 104  CO2 22 - 32 mmol/L 31 32 29  Calcium 8.9 - 10.3 mg/dL 9.8 9.1 8.9  Total Protein 6.5 - 8.1 g/dL 7.0 - -  Total Bilirubin 0.3 - 1.2 mg/dL 0.3 - -  Alkaline Phos 38 - 126 U/L 86 - -  AST 15 - 41 U/L 20 - -  ALT 0 - 44 U/L 17 - -      RADIOGRAPHIC STUDIES: I have personally reviewed the radiological images as listed and agreed with the findings in the report. No results found.   ASSESSMENT & PLAN:   Lindsay Little is a 54 y.o. female with    1. Lower back pain, Lumbar spine L3-4 discitis  -Her CT AP from 06/18/19 shows no evidence of malignancy. She does have bone loss between L3-L4. Lumbar MRI from 06/26/19 shows evidence of chronic infection between L3-L4. The radiologist is suspicious of infectious discitis  -07/01/19 biopsy results show Gram stain was negative for bacteria, and negative for malignant cells  -ID Dr Tommy Medal started treatment with IV antibiotics vancomycin and ceftriaxone through PICC line for 6 weeks starting 07/2019 which she had at home and completes on 08/25/19. I will defer Dr. Tommy Medal if she needs a repeated scan  -She is able to walk and function now at home,  Pain overall better but still has severe back pain that radiates to abdomen, hips, thighs. She is on Celebrex 234m BID, Gabapentin 8064mQID, Cymbalta 12078mightly, Fentanyl patch 100m71mr q72hr, and Norco 10-325 for breakthrough 2-3 tabs a day. She notes her pain is currently controlled at a 7/10.  -MSIR was not enough to control her pain after it worsened. She was recently denied Fentanyl refill from insurance. I will check on this.  -She was previously referred to Dr. HarkMaryjean Ka plans to see in 09/14/19. Maybe he can do possible nerve block to help her pain. I recommend she tell her cardiologist she is on Celebrex. She has not been cleared to drive  for now.  -I encourage her to participate PT when her pain is better controlled based on Dr. Ronnald Ramp opinion.  -She has not been eating as much and has lost weight since her hospitalization. I encouraged her to start nutritional supplements.        2.Rectal adenocarcinoma T3N2M0, Stage IIIC , ypT1N0 after neoadjuvant chemotherapy and radiation -Diagnosed in 01/2015. Treated with surgerywith neoadjuvantchemo and radiation. She is currently on surveillance.She has permanent colostomy bag in place.  -She has previously had yellow/bloody draining of her vagina and  rectum in 2019-2020. It was suggested byDr.Matthewat [facility] to have rectal resection due to her high rectovaginal fistular. I previously spoke with Dr. Johney Maine. He indicates that her rectum has been removed, no more surgery he can offer.  -07/01/19 bone biopsy was negative for malignancy. This is not related to her cancer.  -From a cancer standpoint she is doing well. She is over 4.5 years from her diagnosis. Her risk of cancer recurrence has significantly low now. Continue 5 year surveillance.  -F/u in 4 more then as needed.    3. Rectal and Vaginal discharge, Urinary incontinence -She has incontinence from prior surgery.  -per Dr.Matthew, she has a high rectovaginal fistula.She has persistent discharge from rectal and vaginal,she wears depends and has to change it 1-2 times daily. -Her surgeon does not recommend further rectal surgery.  -f/u withDr. Rodman Key and Dr. Johney Maine  4. HTN -She'll continue follow-up with her primary care physician   5. Social and Financial Support  -She has been on Disability but will expire on 2/13. I recommend she renew through Dr. Tommy Medal.  -She is emotional today about her medical conditions and chronic pain. She will continue to f/u with our SW for counseling if needed.  -She notes she has been well supported at home with friends and family.    Plan -I refilled Celebrex, Gabapentin, Fentanyl patch, Norco. I spoke with her pharmacy, and will appeal her fentanyl denial. I do not plan to refill her pain meds agin since she is going to see Dr. Maryjean Ka on 09/16/19  -will send a message to Dr. Tommy Medal for her FMLA paper work renew  -will send a message to palliative medicine Dr. Hilma Favors if she is able to see her in clinic, she saw her in hospital  -Lab and F/u in 4 months     No problem-specific Assessment & Plan notes found for this encounter.   Orders Placed This Encounter  Procedures  . [facility] (IN HOUSE-[facility])   All questions were answered. The patient  knows to call the clinic with any problems, questions or concerns. No barriers to learning was detected. The total time spent in the appointment was 35 minutes.     Truitt Merle, MD 08/25/2019   I, Joslyn Devon, am acting as scribe for Truitt Merle, MD.   I have reviewed the above documentation for accuracy and completeness, and I agree with the above.

## 2019-08-21 ENCOUNTER — Other Ambulatory Visit (HOSPITAL_COMMUNITY)
Admission: RE | Admit: 2019-08-21 | Discharge: 2019-08-21 | Disposition: A | Discharge status: Home / Self Care | Payer: 59 | Source: Other Acute Inpatient Hospital | Attending: Internal Medicine | Admitting: Internal Medicine

## 2019-08-21 DIAGNOSIS — C218 Malignant neoplasm of overlapping sites of rectum, anus and anal canal: Secondary | ICD-10-CM | POA: Insufficient documentation

## 2019-08-21 DIAGNOSIS — M4640 Discitis, unspecified, site unspecified: Secondary | ICD-10-CM | POA: Insufficient documentation

## 2019-08-21 LAB — VANCOMYCIN, TROUGH: Vancomycin Tr: 13 ug/mL — ABNORMAL LOW (ref 15–20)

## 2019-08-21 LAB — BASIC METABOLIC PANEL
Anion gap: 8 (ref 5–15)
BUN: 8 mg/dL (ref 6–20)
CO2: 29 mmol/L (ref 22–32)
Calcium: 8.9 mg/dL (ref 8.9–10.3)
Chloride: 104 mmol/L (ref 98–111)
Creatinine, Ser: 0.69 mg/dL (ref 0.44–1.00)
GFR calc Af Amer: >60 mL/min (ref >60)
GFR calc non Af Amer: >60 mL/min (ref >60)
Glucose, Bld: 130 mg/dL — ABNORMAL HIGH (ref 70–99)
Potassium: 3.8 mmol/L (ref 3.5–5.1)
Sodium: 141 mmol/L (ref 135–145)

## 2019-08-23 ENCOUNTER — Other Ambulatory Visit (HOSPITAL_COMMUNITY)
Admit: 2019-08-23 | Discharge: 2019-08-23 | Disposition: A | Discharge status: Home / Self Care | Payer: 59 | Source: Ambulatory Visit | Attending: Internal Medicine | Admitting: Internal Medicine

## 2019-08-23 DIAGNOSIS — C218 Malignant neoplasm of overlapping sites of rectum, anus and anal canal: Secondary | ICD-10-CM | POA: Diagnosis not present

## 2019-08-23 DIAGNOSIS — M4640 Discitis, unspecified, site unspecified: Secondary | ICD-10-CM | POA: Insufficient documentation

## 2019-08-23 LAB — CBC WITH DIFFERENTIAL/PLATELET
Abs Immature Granulocytes: 0.01 10*3/uL (ref 0.00–0.07)
Basophils Absolute: 0.1 10*3/uL (ref 0.0–0.1)
Basophils Relative: 2 %
Eosinophils Absolute: 0.6 10*3/uL — ABNORMAL HIGH (ref 0.0–0.5)
Eosinophils Relative: 16 %
HCT: 36.2 % (ref 36.0–46.0)
Hemoglobin: 10.7 g/dL — ABNORMAL LOW (ref 12.0–15.0)
Immature Granulocytes: 0 %
Lymphocytes Relative: 23 %
Lymphs Abs: 0.8 10*3/uL (ref 0.7–4.0)
MCH: 26.6 pg (ref 26.0–34.0)
MCHC: 29.6 g/dL — ABNORMAL LOW (ref 30.0–36.0)
MCV: 90 fL (ref 80.0–100.0)
Monocytes Absolute: 0.4 10*3/uL (ref 0.1–1.0)
Monocytes Relative: 12 %
Neutro Abs: 1.7 10*3/uL (ref 1.7–7.7)
Neutrophils Relative %: 47 %
Platelets: 190 10*3/uL (ref 150–400)
RBC: 4.02 MIL/uL (ref 3.87–5.11)
RDW: 14.5 % (ref 11.5–15.5)
WBC: 3.5 10*3/uL — ABNORMAL LOW (ref 4.0–10.5)
nRBC: 0 % (ref 0.0–0.2)

## 2019-08-23 LAB — BASIC METABOLIC PANEL
Anion gap: 6 (ref 5–15)
BUN: 12 mg/dL (ref 6–20)
CO2: 32 mmol/L (ref 22–32)
Calcium: 9.1 mg/dL (ref 8.9–10.3)
Chloride: 103 mmol/L (ref 98–111)
Creatinine, Ser: 0.65 mg/dL (ref 0.44–1.00)
GFR calc Af Amer: >60 mL/min (ref >60)
GFR calc non Af Amer: >60 mL/min (ref >60)
Glucose, Bld: 96 mg/dL (ref 70–99)
Potassium: 4.4 mmol/L (ref 3.5–5.1)
Sodium: 141 mmol/L (ref 135–145)

## 2019-08-23 LAB — VANCOMYCIN, TROUGH: Vancomycin Tr: 18 ug/mL (ref 15–20)

## 2019-08-25 ENCOUNTER — Inpatient Hospital Stay (HOSPITAL_BASED_OUTPATIENT_CLINIC_OR_DEPARTMENT_OTHER): Payer: 59 | Admitting: Hematology

## 2019-08-25 ENCOUNTER — Other Ambulatory Visit: Payer: Self-pay

## 2019-08-25 ENCOUNTER — Telehealth: Payer: Self-pay | Admitting: Hematology

## 2019-08-25 ENCOUNTER — Telehealth: Payer: Self-pay

## 2019-08-25 ENCOUNTER — Encounter: Payer: Self-pay | Admitting: Hematology

## 2019-08-25 ENCOUNTER — Inpatient Hospital Stay: Payer: 59 | Attending: Nurse Practitioner

## 2019-08-25 VITALS — BP 153/103 | HR 59 | Temp 97.8°F | Resp 20 | Ht 70.5 in | Wt 319.2 lb

## 2019-08-25 DIAGNOSIS — Z808 Family history of malignant neoplasm of other organs or systems: Secondary | ICD-10-CM | POA: Diagnosis not present

## 2019-08-25 DIAGNOSIS — I10 Essential (primary) hypertension: Secondary | ICD-10-CM | POA: Diagnosis not present

## 2019-08-25 DIAGNOSIS — Z923 Personal history of irradiation: Secondary | ICD-10-CM | POA: Insufficient documentation

## 2019-08-25 DIAGNOSIS — R198 Other specified symptoms and signs involving the digestive system and abdomen: Secondary | ICD-10-CM | POA: Diagnosis not present

## 2019-08-25 DIAGNOSIS — Z800 Family history of malignant neoplasm of digestive organs: Secondary | ICD-10-CM | POA: Insufficient documentation

## 2019-08-25 DIAGNOSIS — F329 Major depressive disorder, single episode, unspecified: Secondary | ICD-10-CM | POA: Diagnosis not present

## 2019-08-25 DIAGNOSIS — Z9221 Personal history of antineoplastic chemotherapy: Secondary | ICD-10-CM | POA: Insufficient documentation

## 2019-08-25 DIAGNOSIS — Z803 Family history of malignant neoplasm of breast: Secondary | ICD-10-CM | POA: Diagnosis not present

## 2019-08-25 DIAGNOSIS — N898 Other specified noninflammatory disorders of vagina: Secondary | ICD-10-CM | POA: Insufficient documentation

## 2019-08-25 DIAGNOSIS — R32 Unspecified urinary incontinence: Secondary | ICD-10-CM | POA: Insufficient documentation

## 2019-08-25 DIAGNOSIS — Z85048 Personal history of other malignant neoplasm of rectum, rectosigmoid junction, and anus: Secondary | ICD-10-CM | POA: Insufficient documentation

## 2019-08-25 DIAGNOSIS — C20 Malignant neoplasm of rectum: Secondary | ICD-10-CM

## 2019-08-25 DIAGNOSIS — M545 Low back pain: Secondary | ICD-10-CM | POA: Diagnosis not present

## 2019-08-25 DIAGNOSIS — F32A Depression, unspecified: Secondary | ICD-10-CM

## 2019-08-25 LAB — CBC WITH DIFFERENTIAL/PLATELET
Abs Immature Granulocytes: 0.02 10*3/uL (ref 0.00–0.07)
Basophils Absolute: 0 10*3/uL (ref 0.0–0.1)
Basophils Relative: 1 %
Eosinophils Absolute: 0.7 10*3/uL — ABNORMAL HIGH (ref 0.0–0.5)
Eosinophils Relative: 12 %
HCT: 38.2 % (ref 36.0–46.0)
Hemoglobin: 12 g/dL (ref 12.0–15.0)
Immature Granulocytes: 0 %
Lymphocytes Relative: 14 %
Lymphs Abs: 0.9 10*3/uL (ref 0.7–4.0)
MCH: 27.1 pg (ref 26.0–34.0)
MCHC: 31.4 g/dL (ref 30.0–36.0)
MCV: 86.2 fL (ref 80.0–100.0)
Monocytes Absolute: 0.6 10*3/uL (ref 0.1–1.0)
Monocytes Relative: 9 %
Neutro Abs: 3.8 10*3/uL (ref 1.7–7.7)
Neutrophils Relative %: 64 %
Platelets: 227 10*3/uL (ref 150–400)
RBC: 4.43 MIL/uL (ref 3.87–5.11)
RDW: 14.2 % (ref 11.5–15.5)
WBC: 5.9 10*3/uL (ref 4.0–10.5)
nRBC: 0 % (ref 0.0–0.2)

## 2019-08-25 LAB — COMPREHENSIVE METABOLIC PANEL
ALT: 17 U/L (ref 0–44)
AST: 20 U/L (ref 15–41)
Albumin: 3.9 g/dL (ref 3.5–5.0)
Alkaline Phosphatase: 86 U/L (ref 38–126)
Anion gap: 11 (ref 5–15)
BUN: 7 mg/dL (ref 6–20)
CO2: 31 mmol/L (ref 22–32)
Calcium: 9.8 mg/dL (ref 8.9–10.3)
Chloride: 104 mmol/L (ref 98–111)
Creatinine, Ser: 0.77 mg/dL (ref 0.44–1.00)
GFR calc Af Amer: >60 mL/min (ref >60)
GFR calc non Af Amer: >60 mL/min (ref >60)
Glucose, Bld: 108 mg/dL — ABNORMAL HIGH (ref 70–99)
Potassium: 4 mmol/L (ref 3.5–5.1)
Sodium: 146 mmol/L — ABNORMAL HIGH (ref 135–145)
Total Bilirubin: 0.3 mg/dL (ref 0.3–1.2)
Total Protein: 7 g/dL (ref 6.5–8.1)

## 2019-08-25 LAB — CEA (IN HOUSE-CHCC): CEA (CHCC-In House): 1.74 ng/mL (ref 0.00–5.00)

## 2019-08-25 MED ORDER — GABAPENTIN 400 MG PO CAPS: 800.0000 mg | ORAL_CAPSULE | Freq: Four times a day (QID) | ORAL | 0 refills | Status: DC

## 2019-08-25 MED ORDER — HYDROCODONE-ACETAMINOPHEN 10-325 MG PO TABS: 1.0000 | ORAL_TABLET | Freq: Three times a day (TID) | ORAL | 0 refills | Status: DC | PRN

## 2019-08-25 MED ORDER — CELECOXIB 200 MG PO CAPS: 200.0000 mg | ORAL_CAPSULE | Freq: Two times a day (BID) | ORAL | 0 refills | Status: DC

## 2019-08-25 MED ORDER — FENTANYL 100 MCG/HR TD PT72: 1.0000 | MEDICATED_PATCH | TRANSDERMAL | 0 refills | Status: AC

## 2019-08-25 NOTE — Telephone Encounter (Signed)
Patient states she was seen by Dr.[name] today who recommended that she continue to remain out of work until seen by Pain MD on 09/14/19.  Patient requests that FMLA paperwork be updated.Previous forms not noted ni epic at this time. Patient will drop off previous filled/signed paperwork tomorrow. Patient states per Dr. Burr Medico office they will send Dr. Tommy Medal this communication as well via epic.  Lindsay Little

## 2019-08-25 NOTE — Telephone Encounter (Signed)
Scheduled appt per 2/10 los.  Sent a message to HIM pool to get a calendar mailed out.

## 2019-08-26 ENCOUNTER — Telehealth: Payer: Self-pay

## 2019-08-26 NOTE — Telephone Encounter (Signed)
I spoke with Lindsay Little, she stated that this medication was denied on 07/25/2019.  She is faxing the denial letter to [place].  She said that we can appeal that denial.  She recommended utilizing [email or web address] to facilitate the appeal.

## 2019-08-26 NOTE — Telephone Encounter (Signed)
Call placed to [facility] and gave [facility] Verbal orders per Dr. Tommy Medal to pull picc line today. VORB and understood. Eugenia Mcalpine

## 2019-08-26 NOTE — Telephone Encounter (Signed)
HH requesting orders to pull picc. Patient completed antibiotic therapy vancomycin and ceftriaxone on August 25, 2019 for  Discitis of lumbar region   Oral Cefdinir and Doxycycline already ordered.  Routing to provider for pull picc orders [employer]

## 2019-08-26 NOTE — Telephone Encounter (Signed)
Fine to pull picc

## 2019-08-27 ENCOUNTER — Telehealth: Payer: Self-pay

## 2019-08-27 NOTE — Telephone Encounter (Signed)
I let Lindsay Little know her insurance denied the fentanyl patches.  She was adamant that Dr. Burr Medico was going to get them approved.  I told Lindsay Hamblen Dr Burr Medico suggest  long acting morphine   She said no. She ws certain Dr. Burr Medico was going to appeal her insurance decision.

## 2019-08-27 NOTE — Telephone Encounter (Signed)
I called pt and explained to her that the appeal will not work given their strict criteria for fentanyl patch. I offered her MS contin and she declined. She will pay out of pocket for now until she sees Dr. Maryjean Ka.   Truitt Merle MD

## 2019-08-30 ENCOUNTER — Telehealth: Payer: Self-pay | Admitting: *Deleted

## 2019-08-30 NOTE — Telephone Encounter (Signed)
FYI Received prior authorization (PA- [id number]) today for Fentanyl 100 mcg/hr patch requested 08/26/2019 for ten patches every thirty days; authorized through 02/23/2020 with instructions to renew before authorization expires. Faxed PA number to [place] at 12:24 pm.

## 2019-09-01 ENCOUNTER — Telehealth: Payer: Self-pay

## 2019-09-01 NOTE — Telephone Encounter (Signed)
TC to Pt per Dr. Burr Medico informed Pt that Fentanyl patch was approved by Insurance. Pt. Verbalized understanding not further problems or concerns noted.

## 2019-09-02 ENCOUNTER — Telehealth: Payer: Medicare Other | Admitting: Cardiology

## 2019-09-06 DIAGNOSIS — R110 Nausea: Secondary | ICD-10-CM

## 2019-09-06 DIAGNOSIS — T847XXD Infection and inflammatory reaction due to other internal orthopedic prosthetic devices, implants and grafts, subsequent encounter: Secondary | ICD-10-CM

## 2019-09-06 MED ORDER — PROMETHAZINE HCL 25 MG PO TABS: 25.0000 mg | ORAL_TABLET | Freq: Four times a day (QID) | ORAL | 0 refills | Status: DC | PRN

## 2019-09-16 ENCOUNTER — Ambulatory Visit: Payer: Medicare Other | Admitting: Cardiology

## 2019-09-20 ENCOUNTER — Other Ambulatory Visit: Payer: Self-pay | Admitting: Hematology

## 2019-09-20 DIAGNOSIS — C20 Malignant neoplasm of rectum: Secondary | ICD-10-CM

## 2019-09-27 ENCOUNTER — Other Ambulatory Visit: Payer: Self-pay | Admitting: Neurological Surgery

## 2019-09-27 DIAGNOSIS — M4646 Discitis, unspecified, lumbar region: Secondary | ICD-10-CM

## 2019-09-29 ENCOUNTER — Ambulatory Visit: Payer: Medicare Other | Admitting: Infectious Disease

## 2019-10-04 NOTE — Telephone Encounter (Signed)
I would not push MRI sooner. I am worried that her family members may have COVID and if she has not yet developed symptoms could be at risk. Has she been vaccinated?

## 2019-10-05 ENCOUNTER — Telehealth: Payer: Self-pay | Admitting: *Deleted

## 2019-10-05 NOTE — Telephone Encounter (Signed)
Replied via mychart to patient's request for advice. Landis Gandy, RN

## 2019-10-11 ENCOUNTER — Other Ambulatory Visit: Payer: Self-pay | Admitting: Infectious Disease

## 2019-10-11 DIAGNOSIS — R110 Nausea: Secondary | ICD-10-CM

## 2019-10-11 DIAGNOSIS — T847XXD Infection and inflammatory reaction due to other internal orthopedic prosthetic devices, implants and grafts, subsequent encounter: Secondary | ICD-10-CM

## 2019-10-25 ENCOUNTER — Ambulatory Visit
Admission: RE | Admit: 2019-10-25 | Discharge: 2019-10-25 | Disposition: A | Discharge status: Home / Self Care | Payer: 59 | Source: Ambulatory Visit | Attending: Neurological Surgery | Admitting: Neurological Surgery

## 2019-10-25 ENCOUNTER — Other Ambulatory Visit: Payer: Self-pay

## 2019-10-25 DIAGNOSIS — M4646 Discitis, unspecified, lumbar region: Secondary | ICD-10-CM

## 2019-10-26 ENCOUNTER — Other Ambulatory Visit: Payer: Self-pay | Admitting: Hematology

## 2019-10-26 DIAGNOSIS — C20 Malignant neoplasm of rectum: Secondary | ICD-10-CM

## 2019-10-29 ENCOUNTER — Ambulatory Visit
Admission: RE | Admit: 2019-10-29 | Discharge: 2019-10-29 | Disposition: A | Discharge status: Home / Self Care | Payer: Medicare Other | Source: Ambulatory Visit | Attending: Hematology | Admitting: Hematology

## 2019-11-01 NOTE — Consult Note (Signed)
[name]   54 yo with prior hx of rectal cancer, s/p resection, complicated by rectovaginal fistuala-she is 4.5 years post cancer dx with low likelihood of recurrent disease. 4 months ago 06/2019 she developed acute onset low back pain - prior hx of lumbar surgery in 2019, CT showed discitis and infection. She completed IV antibiotics 08/2019 and is followed by ID. Pain has been an ongoing issue for her-it is improved on opioids as well as her functional status but she has significant anxiety related to weaning opioids and has signs of PTSD related to uncontrolled pain at the time of diagnosis especially while hospitalized and she was not well controlled. The neuropathic pain associated with discitis is typically extremely severe.   She has anticipatory anxiety about changes to medications but is working with Dr. Maryjean Ka and his team to maximize her functional status and control her pain. The main issue is sedation with opioids and gabapentin. They have switched her to [place] which has less sedation. I recommended reduction in Duragesic dosing but with reassurance that she would have oral meds for pain when needed. She reports sleeping too much and she has had considerable weight loss.   Recommendations:  1. She will need ongoing pain management services and I suspect she will have some permanent existing disability related to all of her health issues but encouraged her to maintain her activity levels to the degree she can tolerate.  2. I will continue to check in and support her from a palliative care standpoint- she is a cancer survivor and also struggles with chronic health problems- this has significant impact on her emotional and psychological wellbeing- She will do best with positive encouragement, support and compassionate listening.  3. I will defer pain management to Dr. Maryjean Ka office-presently she does not have life limiting illness but she will need specialized  pain management moving forward.  Lindsay Hacker, DO Palliative Medicine  Time: 40 min Greater than 50%  of this time was spent counseling and coordinating care related to the above assessment and plan.

## 2019-11-08 ENCOUNTER — Other Ambulatory Visit: Payer: Self-pay | Admitting: Infectious Disease

## 2019-11-08 DIAGNOSIS — T847XXD Infection and inflammatory reaction due to other internal orthopedic prosthetic devices, implants and grafts, subsequent encounter: Secondary | ICD-10-CM

## 2019-11-08 DIAGNOSIS — R110 Nausea: Secondary | ICD-10-CM

## 2019-11-10 ENCOUNTER — Ambulatory Visit
Admission: RE | Admit: 2019-11-10 | Discharge: 2019-11-10 | Disposition: A | Discharge status: Home / Self Care | Payer: 59 | Source: Ambulatory Visit | Attending: Neurological Surgery | Admitting: Neurological Surgery

## 2019-11-10 DIAGNOSIS — M4646 Discitis, unspecified, lumbar region: Secondary | ICD-10-CM

## 2019-11-10 MED ORDER — GADOBENATE DIMEGLUMINE 529 MG/ML IV SOLN
20.0000 mL | Freq: Once | INTRAVENOUS | Status: AC | PRN
  Administered 2019-11-10: 20 mL via INTRAVENOUS

## 2019-11-23 ENCOUNTER — Other Ambulatory Visit: Payer: Self-pay | Admitting: Hematology

## 2019-11-23 DIAGNOSIS — C20 Malignant neoplasm of rectum: Secondary | ICD-10-CM

## 2019-11-26 ENCOUNTER — Encounter: Payer: Self-pay | Admitting: Cardiology

## 2019-11-26 ENCOUNTER — Other Ambulatory Visit: Payer: Self-pay

## 2019-11-26 ENCOUNTER — Ambulatory Visit (INDEPENDENT_AMBULATORY_CARE_PROVIDER_SITE_OTHER): Payer: 59 | Admitting: Cardiology

## 2019-11-26 DIAGNOSIS — I10 Essential (primary) hypertension: Secondary | ICD-10-CM | POA: Diagnosis not present

## 2019-11-26 NOTE — Patient Instructions (Signed)
Medication Instructions:   Your physician recommends that you continue on your current medications as directed. Please refer to the Current Medication list given to you today.  *If you need a refill on your cardiac medications before your next appointment, please call your pharmacy*   Follow-Up: At [facility], you and your health needs are our priority.  As part of our continuing mission to provide you with exceptional heart care, we have created designated Provider Care Teams.  These Care Teams include your primary Cardiologist (physician) and Advanced Practice Providers (APPs -  Physician Assistants and Nurse Practitioners) who all work together to provide you with the care you need, when you need it.  We recommend signing up for the patient portal called "MyChart".  Sign up information is provided on this After Visit Summary.  MyChart is used to connect with patients for Virtual Visits (Telemedicine).  Patients are able to view lab/test results, encounter notes, upcoming appointments, etc.  Non-urgent messages can be sent to your provider as well.   To learn more about what you can do with MyChart, go to [email or web address].    Your next appointment:   12 month(s)  The format for your next appointment:   In Person  Provider:   Candee Furbish, MD

## 2019-11-26 NOTE — Progress Notes (Signed)
Cardiology Office Note:    Date:  11/26/2019   ID:  [name], DOB May 31, 1966, MRN [id number]  PCP:  [name], MD  Cardiologist:  Candee Furbish, MD  Electrophysiologist:  None   Referring MD: [name], MD     History of Present Illness:    Lindsay Little is a 54 y.o. 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  Stable.  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  Used to work side-by-side with Dr. Elenore Rota as well.  Last time I saw her she was going forward with spinal surgery with Dr. Sherley Bounds.  She was going to have kyphoplasty.  Past Medical History:  Diagnosis Date  . Anxiety   . Chemotherapy-induced neuropathy (HCC)    toes and fingers numbness and tingling  . Colorectal delayed anastomotic leak s/p resection & colostomy 07/12/2016 11/17/2015  . Colostomy in place [facility]) 07/12/2016   due to anastomosis breakdown w/ colovaginal fistula  . Colovaginal fistula s/p omentopexy repair 07/12/2016 07/10/2016  . Complication of anesthesia   . Depression   . Family history of adverse reaction to anesthesia    mother-- ponv  . Family history of breast cancer   . Family history of pancreatic cancer   . Family history of skin cancer   . Gastroenteritis 12/20/2015  . GERD (gastroesophageal reflux disease)   . Hardware complicating wound infection ([place]) 08/03/2019  . Hiatal hernia   . History of cancer chemotherapy 02-20-2015 to 03-30-2015  . History of cardiac murmur as a child   . History of chemotherapy   . History of chronic gastritis   . History of hypertension    no issues since multiple abdminal sx's and chemo--- no medication since 12/ 2016  . History of TMJ disorder   . Hypercholesteremia   . Hypertension   . IBS (irritable bowel syndrome)    dx age 11  . Intermittent abdominal pain    post-op multiple abdominal sx's   . Microcytic anemia   . Mild sleep apnea    per pt study 2014  very mild osa , no cpap recommended, recommeded wt loss and sleep routine  . OA (osteoarthritis)    left knee /  left shoulder  . Obesity   . PONV (postoperative nausea and vomiting)    severe" needs Scopolamine PATCH"   . Portacath in place    right chest  . Rectal adenocarcinoma ([place]) oncologis-  dr Burr Medico-- after radiation/ chemo (ypT1, N0) --  no recurrence per last note 03/ 2018   dx 01-13-2015-- Stage IIIC (T3, N2, M0) post concurrent radiation and chemotherapy 02-20-2015 to 03-30-2015 /  05-25-2015 s/p  LAR w/ RSO (post-op complicated by late abscess and contained anatomotic leak w/ help percutaneous drainage and antibiotics)   . Rotator cuff tear, left   . S/P radiation therapy 02/20/15-03/30/15   colon/rectal  . Vitamin D deficiency   . Wears glasses   . Wears glasses     Past Surgical History:  Procedure Laterality Date  . ABDOMINAL HYSTERECTOMY  1996   uterus and cervix  . BACK SURGERY  02/12/2018   lumbar surgery  . COLON RESECTION N/A 07/12/2016   Procedure: LAPAROSCOPIC LYSIS OF ADHESIONS, OMENTOPEXY, HAND ASSISTED RESECTION OF  COLON, END TO END ANASTOMOSIS, COLOSTOMY;  Surgeon: Remo Lipps  Gross, MD;  Location: [facility];  Service: General;  Laterality: N/A;  . COMBINED HYSTEROSCOPY DIAGNOSTIC / D&C  x2 1990's  . DIAGNOSTIC LAPAROSCOPY  age 73 and age 11  . EUS N/A 01/18/2015   Procedure: LOWER ENDOSCOPIC ULTRASOUND (EUS);  Surgeon: Arta Silence, MD;  Location: [facility];  Service: Endoscopy;  Laterality: N/A;  . EXCISION OF SKIN TAG  11/17/2015   Procedure: EXCISION OF SKIN TAG;  Surgeon: Michael Boston, MD;  Location: [facility];  Service: General;;  . ILEO LOOP COLOSTOMY CLOSURE N/A 11/17/2015   Procedure: LAPAROSCOPIC DIVERTING LOOP ILEOSTOMY  DRAINAGE OF PELVIC ABSCESS;  Surgeon: Michael Boston, MD;  [facility]: [facility];  Service: General;   Laterality: N/A;  . ILEOSTOMY CLOSURE N/A 05/31/2016   Procedure: TAKEDOWN LOOP ILEOSTOMY;  Surgeon: Michael Boston, MD;  [facility]: [facility];  Service: General;  Laterality: N/A;  . IMPACTION REMOVAL  11/17/2015   Procedure: DISIMPACTION REMOVAL;  Surgeon: Michael Boston, MD;  [facility];  Service: General;;  . INSERTION OF MESH N/A 07/23/2018   Procedure: INSERTION OF MESH X2;  Surgeon: Michael Boston, MD;  [facility]: [facility];  Service: General;  Laterality: N/A;  . IR LUMBAR [place] W/IMG GUIDE  07/01/2019  . KNEE ARTHROSCOPY Left 1990's  . KNEE ARTHROSCOPY W/ MENISCECTOMY Left 09/14/2009   and chondroplasty debridement  . LAPAROSCOPIC LYSIS OF ADHESIONS  11/17/2015   Procedure: LAPAROSCOPIC LYSIS OF ADHESIONS;  Surgeon: Michael Boston, MD;  [facility]: [facility];  Service: General;;  . LUMBAR WOUND DEBRIDEMENT N/A 04/24/2018   Procedure: wound exploration, irrigation and debridement;  Surgeon: Eustace Moore, MD;  Location: [facility];  Service: Neurosurgery;  Laterality: N/A;  wound exploration, irrigation and debridement  . LYSIS OF ADHESION N/A 07/23/2018   Procedure: LYSIS OF ADHESIONS;  Surgeon: Michael Boston, MD;  [facility];  Service: General;  Laterality: N/A;  . PORT-A-CATH REMOVAL N/A 11/21/2016   Procedure: REMOVAL PORT-A-CATH;  Surgeon: Michael Boston, MD;  Location: [facility];  Service: General;  Laterality: N/A;  . PORTACATH PLACEMENT N/A 05/25/2015   Procedure: INSERTION PORT-A-CATH;  Surgeon: Michael Boston, MD;  [facility];  Service: General;  Laterality: N/A;-remains inplace Right chest.  . ROTATOR CUFF REPAIR Right 2006  . TONSILLECTOMY  age 33  . VENTRAL HERNIA REPAIR N/A 07/23/2018   Procedure: LAPAROSCOPIC VENTRAL WALL HERNIA REPAIR;  Surgeon: Michael Boston, MD;  [facility];  Service: General;  Laterality: N/A;  . XI ROBOTIC ASSISTED LOWER ANTERIOR RESECTION N/A 05/25/2015   Procedure: XI ROBOTIC ASSISTED LOWER ANTERIOR RESECTION, , RIGID PROCTOSCOPY,  RIGHT OOPHORECTOMY;  Surgeon: Michael Boston, MD;  [facility];  Service: General;  Laterality: N/A;    Current Medications: Current Meds  Medication Sig  . cefdinir (OMNICEF) 300 MG capsule Take 1 capsule (300 mg total) by mouth 2 (two) times daily.  . celecoxib (CELEBREX) 200 MG capsule TAKE ONE CAPSULE BY MOUTH TWICE DAILY  . Cholecalciferol (VITAMIN D3) 5000 units CAPS Take 5,000 Units by mouth at bedtime.   . Coenzyme Q10 (COQ10) 200 MG CAPS Take 200 mg by mouth at bedtime.  Marland Kitchen doxycycline (VIBRA-TABS) 100 MG tablet Take 1 tablet (100 mg total) by mouth 2 (two) times daily.  . DULoxetine (CYMBALTA) 60 MG capsule Take 120 mg by mouth at bedtime.   Marland Kitchen esomeprazole (NEXIUM) 40 MG capsule Take 1 capsule (40 mg total) by mouth 2 (two) times daily before a meal.  . fentaNYL (DURAGESIC) 100 MCG/HR 1 patch  every 3 (three) days.  . furosemide (LASIX) 20 MG tablet Take 20 mg by mouth daily as needed for edema.   . gabapentin (NEURONTIN) 400 MG capsule Take 2 capsules (800 mg total) by mouth 4 (four) times daily.  Marland Kitchen HYDROcodone-acetaminophen (NORCO) 10-325 MG tablet Take 1 tablet by mouth every 8 (eight) hours as needed for severe pain.  Marland Kitchen levETIRAcetam (KEPPRA) 500 MG tablet Take 500 mg by mouth 2 (two) times daily.  Marland Kitchen lisinopril (ZESTRIL) 5 MG tablet Take 5 mg by mouth daily.  . Melatonin 3 MG TABS Take 3-6 mg by mouth at bedtime.   . Multiple Vitamin (MULTIVITAMIN WITH MINERALS) TABS tablet Take 2 tablets by mouth at bedtime.   . polyethylene glycol (MIRALAX / GLYCOLAX) packet Take 17 g by mouth daily as needed for moderate constipation.  . promethazine (PHENERGAN) 25 MG tablet TAKE ONE TABLET BY MOUTH EVERY 6 HOURS AS NEEDED FOR NAUSEA OR VOMITING  . rosuvastatin (CRESTOR) 5 MG tablet Take 5 mg by mouth at bedtime.  . triamcinolone lotion (KENALOG) 0.1 % Apply 1 application topically as needed (dry skin).     Allergies:   Caine-1 [lidocaine], Sulfa antibiotics, Adhesive [tape], Doxycycline,  Flagyl [metronidazole], Iron, Oxycodone, and Penicillins   Social History   Socioeconomic History  . Marital status: Married    Spouse name: Not on file  . Number of children: Not on file  . Years of education: Not on file  . Highest education level: Not on file  Occupational History  . Not on file  Tobacco Use  . Smoking status: Former Smoker    Packs/day: 0.25    Years: 7.00    Pack years: 1.75    Types: Cigarettes    Quit date: 07/16/1991    Years since quitting: 28.3  . Smokeless tobacco: Never Used  Substance and Sexual Activity  . Alcohol use: No  . Drug use: Never  . Sexual activity: Yes    Birth control/protection: Surgical  Other Topics Concern  . Not on file  Social History Narrative   Tobacco Use: Cigarettes - Former Smoker   Alcohol: Yes, very rare, liquor.    No recreational drug use   Occupation: Head CMA @ [employer]   Marital Status: Married    Husband: Roselyn Reef Disabled   Children: 2 adopted kids [place]   Religion: [employer] [place]               Social Determinants of Health   Financial Resource Strain:   . Difficulty of Paying Living Expenses:   Food Insecurity:   . Worried About Charity fundraiser in the Last Year:   . Arboriculturist in the Last Year:   Transportation Needs:   . Film/video editor (Medical):   Marland Kitchen Lack of Transportation (Non-Medical):   Physical Activity:   . Days of Exercise per Week:   . Minutes of Exercise per Session:   Stress:   . Feeling of Stress :   Social Connections:   . Frequency of Communication with Friends and Family:   . Frequency of Social Gatherings with Friends and Family:   . Attends Religious Services:   . Active Member of Clubs or Organizations:   . Attends Archivist Meetings:   Marland Kitchen Marital Status:      Family History: The patient's family history includes CVA in her maternal grandmother; Cancer in her sister; Cancer (age of onset: 49) in her maternal uncle;  Cirrhosis in her maternal uncle; Coronary artery disease in her father, paternal grandfather, and paternal grandmother; Coronary artery disease (age of onset: 30) in her mother; Coronary artery disease (age of onset: 64) in her maternal grandfather; Diabetes Mellitus I in her maternal grandmother, mother, and paternal grandmother; Hyperlipidemia in her brother, father, maternal grandfather, maternal grandmother, mother, paternal grandfather, and paternal grandmother; Hypertension in her father, maternal grandfather, maternal grandmother, mother, paternal grandfather, and paternal grandmother.  ROS:   Please see the history of present illness.     All other systems reviewed and are negative.  EKGs/Labs/Other Studies Reviewed:    The following studies were reviewed today: Prior hospital records reviewed.  EKG:  EKG is  ordered today.  The ekg ordered today demonstrates sinus rhythm 61 no other changes.  Recent Labs: 08/25/2019: ALT 17; BUN 7; Creatinine, Ser 0.77; Hemoglobin 12.0; Platelets 227; Potassium 4.0; Sodium 146  Recent Lipid Panel No results found for: CHOL, TRIG, HDL, CHOLHDL, VLDL, LDLCALC, LDLDIRECT  Physical Exam:    VS:  BP 120/80   Pulse 61   Ht 5' 10.5" (1.791 m)   Wt (!) 306 lb 12.8 oz (139.2 kg)   SpO2 97%   BMI 43.40 kg/m     Wt Readings from Last 3 Encounters:  11/26/19 (!) 306 lb 12.8 oz (139.2 kg)  08/25/19 (!) 319 lb 3.2 oz (144.8 kg)  08/03/19 (!) 330 lb (149.7 kg)     GEN:  Well nourished, well developed in no acute distress HEENT: Normal NECK: No JVD; No carotid bruits LYMPHATICS: No lymphadenopathy CARDIAC: RRR, no murmurs, rubs, gallops RESPIRATORY:  Clear to auscultation without rales, wheezing or rhonchi  ABDOMEN: Soft, non-tender, non-distended MUSCULOSKELETAL:  No edema; No deformity  SKIN: Warm and dry NEUROLOGIC:  Alert and oriented x 3 PSYCHIATRIC:  Normal affect   ASSESSMENT:    1. Essential hypertension    PLAN:    In order of  problems listed above:  Dilated pulmonary artery 3.5 cm -Suggestive of right-sided pressures increases. Likely from morbid obesity.  ECHO 2019:  - Left ventricle: The cavity size was normal. Wall thickness was  increased in a pattern of mild LVH. Systolic function was normal.  The estimated ejection fraction was in the range of 50% to 55%.  Wall motion was normal; there were no regional wall motion  abnormalities. Doppler parameters are consistent with abnormal  left ventricular relaxation (grade 1 diastolic dysfunction). The  E/e&' ratio is between 8-15, suggesting indeterminate LV filling  pressure.  - Mitral valve: Calcified annulus. Mildly thickened leaflets .  There was trivial regurgitation.  - Left atrium: The atrium was normal in size.  - Inferior vena cava: The vessel was normal in size. The  respirophasic diameter changes were in the normal range (>= 50%),  consistent with normal central venous pressure.   Impressions:   - LVEF 50-55%, mild LVH, normal wall motion, grade 1 DD,  indeterminate LV filling pressure, trivial MR, normal LA Size,  normal IVC.   Discitis -Immense pain from this.  Antibiotics.  Family history of coronary artery disease -Continues with low-dose Crestor.  LDL 88.  Excellent.  Morbid obesity - She did lose approximately 120 pounds during her recovery after cancer surgery.    Continues to lose about 40 pounds after being on fentanyl patch.  Rectal adenocarcinoma - Ostomy bag in place.    No future abdominal surgery planned.  Dr. Johney Maine  Medication Adjustments/Labs and Tests Ordered: Current medicines are reviewed at  length with the patient today.  Concerns regarding medicines are outlined above.  Orders Placed This Encounter  Procedures  . EKG 12-Lead   No orders of the defined types were placed in this encounter.   Patient Instructions  Medication Instructions:   Your physician recommends that you continue on  your current medications as directed. Please refer to the Current Medication list given to you today.  *If you need a refill on your cardiac medications before your next appointment, please call your pharmacy*   Follow-Up: At [facility], you and your health needs are our priority.  As part of our continuing mission to provide you with exceptional heart care, we have created designated Provider Care Teams.  These Care Teams include your primary Cardiologist (physician) and Advanced Practice Providers (APPs -  Physician Assistants and Nurse Practitioners) who all work together to provide you with the care you need, when you need it.  We recommend signing up for the patient portal called "MyChart".  Sign up information is provided on this After Visit Summary.  MyChart is used to connect with patients for Virtual Visits (Telemedicine).  Patients are able to view lab/test results, encounter notes, upcoming appointments, etc.  Non-urgent messages can be sent to your provider as well.   To learn more about what you can do with MyChart, go to [email or web address].    Your next appointment:   12 month(s)  The format for your next appointment:   In Person  Provider:   Candee Furbish, MD       Signed, Candee Furbish, MD  11/26/2019 11:25 AM    [place]

## 2019-11-27 ENCOUNTER — Other Ambulatory Visit: Payer: Medicare Other

## 2019-12-01 ENCOUNTER — Other Ambulatory Visit: Payer: Self-pay

## 2019-12-01 ENCOUNTER — Ambulatory Visit (INDEPENDENT_AMBULATORY_CARE_PROVIDER_SITE_OTHER): Payer: 59 | Admitting: Infectious Disease

## 2019-12-01 VITALS — BP 132/82 | HR 74 | Temp 97.9°F | Wt 305.0 lb

## 2019-12-01 DIAGNOSIS — Z981 Arthrodesis status: Secondary | ICD-10-CM | POA: Diagnosis not present

## 2019-12-01 DIAGNOSIS — G894 Chronic pain syndrome: Secondary | ICD-10-CM

## 2019-12-01 DIAGNOSIS — M4647 Discitis, unspecified, lumbosacral region: Secondary | ICD-10-CM

## 2019-12-01 DIAGNOSIS — T847XXA Infection and inflammatory reaction due to other internal orthopedic prosthetic devices, implants and grafts, initial encounter: Secondary | ICD-10-CM

## 2019-12-01 DIAGNOSIS — N739 Female pelvic inflammatory disease, unspecified: Secondary | ICD-10-CM | POA: Diagnosis not present

## 2019-12-01 DIAGNOSIS — C20 Malignant neoplasm of rectum: Secondary | ICD-10-CM

## 2019-12-01 NOTE — Progress Notes (Signed)
Subjective:   Chief complaint lower back pain better controlled first day of fentanyl   [name], female    DOB: October 31, 1965, 54 y.o.   MRN: [id number]  HPI    54 y.o. female w hx of rectal cancer sp surgery, XRT, chemotherapy with course complicated by intraabdominal abscess recently diagnosed with Lumbar diskitis sp IR guided aspirate with cultures being unrevealing. We arranged for her to get PICC and to start IV ceftriaxone and vancomycin but her out of pocket cost per week was $1k per week and this was felt prohibitive. Therefore IV antibiotics were abandoned and she was placed on bio-available doxycycline and levaquin. However pain has worsened and she returned to ER where MRi shows worsening diskitis and formation of phlegmon.  I saw her in the hospital we initiated vancomycin and ceftriaxone.  Turns out that they are going to be able to cover this and they already have a lot of out of acute expenses that were incurred while she was hospitalized.  Of note she does have hardware in the L4-L5 area and have some concern whether this may be infected as well.  She was seen by neurosurgery while in the hospital and also palliative care.  She has subsequently had improvement in her lower back pain particularly the on the longer the component which radiates into her left leg.   She is not been working due to severe pain which is impaired her ability to dress herself bathe herself and perform various ADLs.  Since I last Saw her she has had a repeat MRI performed.  I personally reviewed the films And agree and agree that she has some radiographic improvement in her L3-L4 discitis along with some still some phlegmon this changes in the psoas muscle     Past Medical History:  Diagnosis Date  . Anxiety   . Chemotherapy-induced neuropathy (HCC)    toes and fingers numbness and tingling  . Colorectal delayed anastomotic leak s/p resection & colostomy 07/12/2016 11/17/2015  .  Colostomy in place [facility]) 07/12/2016   due to anastomosis breakdown w/ colovaginal fistula  . Colovaginal fistula s/p omentopexy repair 07/12/2016 07/10/2016  . Complication of anesthesia   . Depression   . Family history of adverse reaction to anesthesia    mother-- ponv  . Family history of breast cancer   . Family history of pancreatic cancer   . Family history of skin cancer   . Gastroenteritis 12/20/2015  . GERD (gastroesophageal reflux disease)   . Hardware complicating wound infection ([place]) 08/03/2019  . Hiatal hernia   . History of cancer chemotherapy 02-20-2015 to 03-30-2015  . History of cardiac murmur as a child   . History of chemotherapy   . History of chronic gastritis   . History of hypertension    no issues since multiple abdminal sx's and chemo--- no medication since 12/ 2016  . History of TMJ disorder   . Hypercholesteremia   . Hypertension   . IBS (irritable bowel syndrome)    dx age 61  . Intermittent abdominal pain    post-op multiple abdominal sx's   . Microcytic anemia   . Mild sleep apnea    per pt study 2014  very mild osa , no cpap recommended, recommeded wt loss and sleep routine  . OA (osteoarthritis)    left knee /  left shoulder  . Obesity   . PONV (postoperative nausea and vomiting)    severe" needs Scopolamine PATCH"   .  Portacath in place    right chest  . Rectal adenocarcinoma ([place]) oncologis-  dr Burr Medico-- after radiation/ chemo (ypT1, N0) --  no recurrence per last note 03/ 2018   dx 01-13-2015-- Stage IIIC (T3, N2, M0) post concurrent radiation and chemotherapy 02-20-2015 to 03-30-2015 /  05-25-2015 s/p  LAR w/ RSO (post-op complicated by late abscess and contained anatomotic leak w/ help percutaneous drainage and antibiotics)   . Rotator cuff tear, left   . S/P radiation therapy 02/20/15-03/30/15   colon/rectal  . Vitamin D deficiency   . Wears glasses   . Wears glasses     Past Surgical History:  Procedure Laterality Date  . ABDOMINAL  HYSTERECTOMY  1996   uterus and cervix  . BACK SURGERY  02/12/2018   lumbar surgery  . COLON RESECTION N/A 07/12/2016   Procedure: LAPAROSCOPIC LYSIS OF ADHESIONS, OMENTOPEXY, HAND ASSISTED RESECTION OF  COLON, END TO END ANASTOMOSIS, COLOSTOMY;  Surgeon: Michael Boston, MD;  Location: [facility];  Service: General;  Laterality: N/A;  . COMBINED HYSTEROSCOPY DIAGNOSTIC / D&C  x2 1990's  . DIAGNOSTIC LAPAROSCOPY  age 37 and age 28  . EUS N/A 01/18/2015   Procedure: LOWER ENDOSCOPIC ULTRASOUND (EUS);  Surgeon: Arta Silence, MD;  Location: [facility];  Service: Endoscopy;  Laterality: N/A;  . EXCISION OF SKIN TAG  11/17/2015   Procedure: EXCISION OF SKIN TAG;  Surgeon: Michael Boston, MD;  Location: [facility];  Service: General;;  . ILEO LOOP COLOSTOMY CLOSURE N/A 11/17/2015   Procedure: LAPAROSCOPIC DIVERTING LOOP ILEOSTOMY  DRAINAGE OF PELVIC ABSCESS;  Surgeon: Michael Boston, MD;  Location: [facility];  Service: General;  Laterality: N/A;  . ILEOSTOMY CLOSURE N/A 05/31/2016   Procedure: TAKEDOWN LOOP ILEOSTOMY;  Surgeon: Michael Boston, MD;  Location: [facility];  Service: General;  Laterality: N/A;  . IMPACTION REMOVAL  11/17/2015   Procedure: DISIMPACTION REMOVAL;  Surgeon: Michael Boston, MD;  Location: [facility];  Service: General;;  . INSERTION OF MESH N/A 07/23/2018   Procedure: INSERTION OF MESH X2;  Surgeon: Michael Boston, MD;  Location: [facility];  Service: General;  Laterality: N/A;  . IR LUMBAR [place] W/IMG GUIDE  07/01/2019  . KNEE ARTHROSCOPY Left 1990's  . KNEE ARTHROSCOPY W/ MENISCECTOMY Left 09/14/2009   and chondroplasty debridement  . LAPAROSCOPIC LYSIS OF ADHESIONS  11/17/2015   Procedure: LAPAROSCOPIC LYSIS OF ADHESIONS;  Surgeon: Michael Boston, MD;  Location: [facility];  Service: General;;  . LUMBAR WOUND DEBRIDEMENT N/A 04/24/2018   Procedure: wound exploration, irrigation and debridement;  Surgeon: Eustace Moore, MD;  Location: [place];  Service: Neurosurgery;  Laterality: N/A;  wound exploration,  irrigation and debridement  . LYSIS OF ADHESION N/A 07/23/2018   Procedure: LYSIS OF ADHESIONS;  Surgeon: Michael Boston, MD;  Location: [facility];  Service: General;  Laterality: N/A;  . PORT-A-CATH REMOVAL N/A 11/21/2016   Procedure: REMOVAL PORT-A-CATH;  Surgeon: Michael Boston, MD;  Location[facility];  Service: General;  Laterality: N/A;  . PORTACATH PLACEMENT N/A 05/25/2015   Procedure: INSERTION PORT-A-CATH;  Surgeon: Michael Boston, MD;  Location: [facility];  Service: General;  Laterality: N/A;-remains inplace Right chest.  . ROTATOR CUFF REPAIR Right 2006  . TONSILLECTOMY  age 50  . VENTRAL HERNIA REPAIR N/A 07/23/2018   Procedure: LAPAROSCOPIC VENTRAL WALL HERNIA REPAIR;  Surgeon: Michael Boston, MD;  Location: [facility];  Service: General;  Laterality: N/A;  . XI ROBOTIC ASSISTED LOWER ANTERIOR RESECTION N/A 05/25/2015   Procedure: XI  ROBOTIC ASSISTED LOWER ANTERIOR RESECTION, , RIGID PROCTOSCOPY, RIGHT OOPHORECTOMY;  Surgeon: Michael Boston, MD;  Location: [facility];  Service: General;  Laterality: N/A;    Family History  Problem Relation Age of Onset  . Coronary artery disease Mother 55  . Hypertension Mother   . Hyperlipidemia Mother   . Diabetes Mellitus I Mother   . Coronary artery disease Father   . Hyperlipidemia Father   . Hypertension Father   . Cancer Sister        skin - non melanoma  . Hyperlipidemia Brother   . Cancer Maternal Uncle 59       pancreatic with mets to colon and prostate  . Cirrhosis Maternal Uncle   . Hypertension Maternal Grandmother   . Diabetes Mellitus I Maternal Grandmother   . Hyperlipidemia Maternal Grandmother   . CVA Maternal Grandmother   . Hypertension Maternal Grandfather   . Coronary artery disease Maternal Grandfather 65  . Hyperlipidemia Maternal Grandfather   . Coronary artery disease Paternal Grandmother   . Hypertension Paternal Grandmother   . Hyperlipidemia Paternal Grandmother   . Diabetes Mellitus I Paternal Grandmother   .  Hypertension Paternal Grandfather   . Hyperlipidemia Paternal Grandfather   . Coronary artery disease Paternal Grandfather       Social History   Socioeconomic History  . Marital status: Married    Spouse name: Not on file  . Number of children: Not on file  . Years of education: Not on file  . Highest education level: Not on file  Occupational History  . Not on file  Tobacco Use  . Smoking status: Former Smoker    Packs/day: 0.25    Years: 7.00    Pack years: 1.75    Types: Cigarettes    Quit date: 07/16/1991    Years since quitting: 28.3  . Smokeless tobacco: Never Used  Substance and Sexual Activity  . Alcohol use: No  . Drug use: Never  . Sexual activity: Yes    Birth control/protection: Surgical  Other Topics Concern  . Not on file  Social History Narrative   Tobacco Use: Cigarettes - Former Smoker   Alcohol: Yes, very rare, liquor.    No recreational drug use   Occupation: Head CMA @ [employer]   Marital Status: Married    Husband: Roselyn Reef Disabled   Children: 2 adopted kids [place]   Religion: [employer] [place]               Social Determinants of Health   Financial Resource Strain:   . Difficulty of Paying Living Expenses:   Food Insecurity:   . Worried About Charity fundraiser in the Last Year:   . Arboriculturist in the Last Year:   Transportation Needs:   . Film/video editor (Medical):   Marland Kitchen Lack of Transportation (Non-Medical):   Physical Activity:   . Days of Exercise per Week:   . Minutes of Exercise per Session:   Stress:   . Feeling of Stress :   Social Connections:   . Frequency of Communication with Friends and Family:   . Frequency of Social Gatherings with Friends and Family:   . Attends Religious Services:   . Active Member of Clubs or Organizations:   . Attends Archivist Meetings:   Marland Kitchen Marital Status:     Allergies  Allergen Reactions  . Caine-1 [Lidocaine] Swelling and Rash    Eyes swell  shut;  includes all caine drugs except marcaine. EMLA cream OK though (?!)  . Sulfa Antibiotics Nausea And Vomiting and Rash  . Adhesive [Tape] Other (See Comments)    Blisters - can use paper tape  . Doxycycline Nausea Only  . Flagyl [Metronidazole] Nausea Only  . Iron Nausea Only  . Oxycodone Other (See Comments)    NIGHTMARES. (tolerates hydrocodone or tramadol better)  . Penicillins Rash    Has patient had a PCN reaction causing immediate rash, facial/tongue/throat swelling, SOB or lightheadedness with hypotension: no Has patient had a PCN reaction causing severe rash involving mucus membranes or skin necrosis: no Has patient had a PCN reaction that required hospitalization no Has patient had a PCN reaction occurring within the last 10 years: no If all of the above answers are "NO", then may proceed with Cephalosporin use.      Current Outpatient Medications:  .  bisoprolol (ZEBETA) 10 MG tablet, Take 10 mg by mouth daily., Disp: , Rfl:  .  cefdinir (OMNICEF) 300 MG capsule, Take 1 capsule (300 mg total) by mouth 2 (two) times daily., Disp: 60 capsule, Rfl: 11 .  celecoxib (CELEBREX) 200 MG capsule, TAKE ONE CAPSULE BY MOUTH TWICE DAILY, Disp: 60 capsule, Rfl: 0 .  Cholecalciferol (VITAMIN D3) 5000 units CAPS, Take 5,000 Units by mouth at bedtime. , Disp: , Rfl:  .  Coenzyme Q10 (COQ10) 200 MG CAPS, Take 200 mg by mouth at bedtime., Disp: , Rfl:  .  doxycycline (VIBRA-TABS) 100 MG tablet, Take 1 tablet (100 mg total) by mouth 2 (two) times daily., Disp: 60 tablet, Rfl: 11 .  DULoxetine (CYMBALTA) 60 MG capsule, Take 120 mg by mouth at bedtime. , Disp: , Rfl:  .  esomeprazole (NEXIUM) 40 MG capsule, Take 1 capsule (40 mg total) by mouth 2 (two) times daily before a meal., Disp: 60 capsule, Rfl: 5 .  fentaNYL (DURAGESIC) 100 MCG/HR, 1 patch every 3 (three) days., Disp: , Rfl:  .  furosemide (LASIX) 20 MG tablet, Take 20 mg by mouth daily as needed for edema. , Disp: , Rfl:  .   HYDROcodone-acetaminophen (NORCO) 10-325 MG tablet, Take 1 tablet by mouth every 8 (eight) hours as needed for severe pain., Disp: 60 tablet, Rfl: 0 .  levETIRAcetam (KEPPRA) 500 MG tablet, Take 500 mg by mouth 2 (two) times daily., Disp: , Rfl:  .  Melatonin 3 MG TABS, Take 3-6 mg by mouth at bedtime. , Disp: , Rfl:  .  Multiple Vitamin (MULTIVITAMIN WITH MINERALS) TABS tablet, Take 2 tablets by mouth at bedtime. , Disp: , Rfl:  .  polyethylene glycol (MIRALAX / GLYCOLAX) packet, Take 17 g by mouth daily as needed for moderate constipation., Disp: , Rfl:  .  promethazine (PHENERGAN) 25 MG tablet, TAKE ONE TABLET BY MOUTH EVERY 6 HOURS AS NEEDED FOR NAUSEA OR VOMITING, Disp: 60 tablet, Rfl: 0 .  rosuvastatin (CRESTOR) 5 MG tablet, Take 5 mg by mouth at bedtime., Disp: , Rfl:  .  triamcinolone lotion (KENALOG) 0.1 %, Apply 1 application topically as needed (dry skin)., Disp: , Rfl:  .  gabapentin (NEURONTIN) 400 MG capsule, Take 2 capsules (800 mg total) by mouth 4 (four) times daily., Disp: 240 capsule, Rfl: 0 .  lisinopril (ZESTRIL) 5 MG tablet, Take 5 mg by mouth daily., Disp: , Rfl:  No current facility-administered medications for this visit.  Facility-Administered Medications Ordered in Other Visits:  .  0.9 %  sodium chloride infusion, , Intravenous, Once, Burr Medico,  Krista Blue, MD  Review of Systems  Constitutional: Negative for activity change, appetite change, chills, diaphoresis, fatigue, fever and unexpected weight change.  HENT: Negative for congestion, rhinorrhea, sinus pressure, sneezing, sore throat and trouble swallowing.   Eyes: Negative for photophobia and visual disturbance.  Respiratory: Negative for cough, chest tightness, shortness of breath, wheezing and stridor.   Cardiovascular: Negative for chest pain, palpitations and leg swelling.  Gastrointestinal: Negative for abdominal distention, abdominal pain, anal bleeding, blood in stool, constipation, diarrhea, nausea and vomiting.    Genitourinary: Negative for difficulty urinating, dysuria, flank pain and hematuria.  Musculoskeletal: Positive for back pain. Negative for arthralgias, gait problem, joint swelling and myalgias.  Skin: Negative for color change, pallor, rash and wound.  Neurological: Negative for dizziness, tremors, weakness and light-headedness.  Hematological: Negative for adenopathy. Does not bruise/bleed easily.  Psychiatric/Behavioral: Negative for agitation, behavioral problems, confusion, decreased concentration, dysphoric mood and sleep disturbance.       Objective:   Physical Exam Constitutional:      General: She is not in acute distress.    Appearance: Normal appearance. She is well-developed. She is not ill-appearing or diaphoretic.  HENT:     Head: Normocephalic and atraumatic.     Right Ear: Hearing and external ear normal.     Left Ear: Hearing and external ear normal.     Nose: No nasal deformity or rhinorrhea.  Eyes:     General: No scleral icterus.    Conjunctiva/sclera: Conjunctivae normal.     Right eye: Right conjunctiva is not injected.     Left eye: Left conjunctiva is not injected.     Pupils: Pupils are equal, round, and reactive to light.  Neck:     Vascular: No JVD.  Cardiovascular:     Rate and Rhythm: Normal rate and regular rhythm.     Heart sounds: S1 normal and S2 normal.  Abdominal:     General: There is no distension.     Palpations: Abdomen is soft.  Musculoskeletal:        General: Normal range of motion.     Right shoulder: Normal.     Left shoulder: Normal.     Cervical back: Normal range of motion and neck supple.     Right hip: Normal.     Left hip: Normal.     Right knee: Normal.     Left knee: Normal.  Lymphadenopathy:     Head:     Right side of head: No submandibular, preauricular or posterior auricular adenopathy.     Left side of head: No submandibular, preauricular or posterior auricular adenopathy.     Cervical: No cervical adenopathy.      Right cervical: No superficial or deep cervical adenopathy.    Left cervical: No superficial or deep cervical adenopathy.  Skin:    General: Skin is warm and dry.     Coloration: Skin is not pale.     Findings: No abrasion, bruising, ecchymosis, erythema, lesion or rash.     Nails: There is no clubbing.  Neurological:     Mental Status: She is alert and oriented to person, place, and time. Mental status is at baseline.     Sensory: No sensory deficit.     Coordination: Coordination normal.     Gait: Gait normal.  Psychiatric:        Attention and Perception: She is attentive.        Mood and Affect: Mood normal.  Speech: Speech normal.        Behavior: Behavior normal. Behavior is cooperative.        Thought Content: Thought content normal.        Judgment: Judgment normal.         Assessment & Plan:   Lumbar discitis with vertebral osteomyelitis and epidural phlegmon with concern that the L4-L5 area of hardware could be involved  Completed vancomycin and ceftriaxone on February 10 and remains on  cefdinir 300 mg twice daily and doxycycline 100 mg twice daily  Chronic lower back pain: She is on fentanyl patches to help control her pain.  She is understandably anxious about the pain medications being weaned.  Certainly from my perspective I also am concerned about the fact that weaning such medications could have a confounding effect upon her ability to interpret how well her infection is responding to antibiotics though the MRI has been encouraging and we will certainly repeat her inflammatory markers.  Depression: Certainly she has coped with quite a number of medical stressors over the years.  I tried to provide some supportive therapy

## 2019-12-02 LAB — SEDIMENTATION RATE: Sed Rate: 31 mm/h — ABNORMAL HIGH (ref 0–30)

## 2019-12-02 LAB — C-REACTIVE PROTEIN: CRP: 32.2 mg/L — ABNORMAL HIGH (ref <8.0)

## 2019-12-03 ENCOUNTER — Other Ambulatory Visit: Payer: Self-pay | Admitting: Infectious Disease

## 2019-12-03 DIAGNOSIS — T847XXD Infection and inflammatory reaction due to other internal orthopedic prosthetic devices, implants and grafts, subsequent encounter: Secondary | ICD-10-CM

## 2019-12-03 DIAGNOSIS — R110 Nausea: Secondary | ICD-10-CM

## 2019-12-17 ENCOUNTER — Telehealth: Payer: Self-pay | Admitting: Hematology

## 2019-12-17 NOTE — Telephone Encounter (Signed)
Faxed medical records to [facility] @ [phone] Release Z.C:[id number]

## 2019-12-20 ENCOUNTER — Telehealth: Payer: Self-pay

## 2019-12-20 NOTE — Telephone Encounter (Signed)
Received fax from [facility] for medical information request. Placed in [facility] release of information box up front for scanning to prepare records requested.  Lindsay Little

## 2019-12-21 ENCOUNTER — Other Ambulatory Visit: Payer: Self-pay | Admitting: Infectious Disease

## 2019-12-21 ENCOUNTER — Other Ambulatory Visit: Payer: Self-pay | Admitting: Hematology

## 2019-12-21 DIAGNOSIS — R110 Nausea: Secondary | ICD-10-CM

## 2019-12-21 DIAGNOSIS — T847XXD Infection and inflammatory reaction due to other internal orthopedic prosthetic devices, implants and grafts, subsequent encounter: Secondary | ICD-10-CM

## 2019-12-21 DIAGNOSIS — C20 Malignant neoplasm of rectum: Secondary | ICD-10-CM

## 2019-12-23 ENCOUNTER — Inpatient Hospital Stay: Payer: 59 | Admitting: Hematology

## 2019-12-23 ENCOUNTER — Inpatient Hospital Stay: Payer: 59

## 2019-12-23 NOTE — Progress Notes (Signed)
[place]   Telephone:(336) [phone] [phone]) [phone]   Clinic Follow up Note   Patient Care Team: [name], MD as PCP - General (Family Medicine) Jerline Pain, MD as PCP - Cardiology (Cardiology) Michael Boston, MD as Consulting Physician (General Surgery) Truitt Merle, MD as Consulting Physician (Medical Oncology) Kyung Rudd, MD as Consulting Physician (Radiation Oncology) Teena Irani, MD (Inactive) as Consulting Physician (Gastroenterology) Raynelle Bring, MD as Consulting Physician (Urology) Eustace Moore, MD as Consulting Physician (Neurosurgery)  Date of Service:  12/26/2019  CHIEF COMPLAINT: F/u on rectal cancer  SUMMARY OF ONCOLOGIC HISTORY: Oncology History Overview Note  Rectal adenocarcinoma   Staging form: Colon and Rectum, AJCC 7th Edition     Clinical: T3, N2, M0 - Unsigned     Rectal adenocarcinoma s/p LAR resection 05/25/2015  01/13/2015 Initial Diagnosis   Rectal adenocarcinoma   01/13/2015 Procedure   Colonoscopy showed a sensitivity O nonobstructing mass in the rectum and from 12-18 cm proximal to the Annis. The mass was circumferential, measuring about 6 cm in length. EGD was negative.   01/13/2015 Cancer Staging   Staging form: Colon and Rectum, AJCC 7th Edition - Clinical stage from 01/13/2015: T3, N2, M0 - Signed by Truitt Merle, MD on 12/15/2017   01/17/2015 Tumor Marker   CEA 1.4, CA-19-9 8, MMR normal    01/18/2015 Procedure   Lower endoscopic ultrasound by Dr. Paulita Fujita showed a T3 N2 rectal mass.   01/20/2015 Imaging   CT abdomen and pelvis with contrast showed right lateral rectal wall exophytic mass, and 2 ill-defined hepatic hypoenhancing lesions which appears to correspond to the lesion seen on the prior MRI in 2015.   02/05/2015 Imaging   abdomen MRI showed 2 hemangioma, no suspicion for metastatic disease. CT chest was negative   02/20/2015 - 03/30/2015 Radiation Therapy   neoadjuvant RT to rectal cancer    02/20/2015 Concurrent  Chemotherapy   capecitabine 2500 mg in the morning and 2000 mg in the evening (870m/m2, bid), on the day of radiation.    05/25/2015 Surgery   Recto-sigmoid segmental resection, margins are negative    05/25/2015 Pathology Results   0.2cm residual invasive adenocarcinoma, G2, negative margins, 12 nodes were negative    05/25/2015 Cancer Staging   Staging form: Colon and Rectum, AJCC 7th Edition - Pathologic stage from 05/25/2015: Stage I (T1, N0, cM0) - Signed by FTruitt Merle MD on 12/15/2017   06/23/2015 - 06/29/2015 Hospital Admission   Patient was admitted for pelvic abscess, drain placed and she was treated with IV antibiotics, she also received 1 dose of Feraheme for anemia.   09/15/2015 - 09/17/2015 Hospital Admission   Recurrent pelvic collection s/p perc drainage 09/16/2015   02/21/2016 Imaging   CT abdomen and pelvis w contrast IMPRESSION: 1. Interval removal of surgical drains since 12/19/2015 from the presacral space. Similar size of ill-defined presacral fluid and gas, for which residual abscess cannot be excluded. Similar amount of intraperitoneal edema throughout the upper pelvis with foci of extraluminal gas, possibly related to the presacral process. 2. Diverting right-sided ileostomy, without acute complication. 3. Similar moderate hydroureteronephrosis, likely due to the pelvic process. 4. Possible bladder wall thickening. Correlate with urinalysis. This appearance could be partially due to underdistention. 5. Geographic hepatic steatosis and hemangiomas. Cannot exclude a new right hepatic lobe lesion. Consider nonemergent pre and post contrast outpatient MRI. This could represent a new area of focal steatosis.   05/31/2016 Surgery   ileostomy takedown BY Dr. GJohney Maine  07/04/2016 Imaging   CT Abdomen Pelvis W Contrast 07/04/16 IMPRESSION: Subcutaneous fluid collection at the site of ileostomy takedown, could be sterile or infected, measuring 9.3 x 4.3 x 5.3 cm; this  could be aspirated under ultrasound guidance if clinically indicated. Large collection of stool within the presacral space, could potentially be within a distended distorted rectum but since this occupies the same position as the abscess collection identified on the previous CT this is suspicious for a contiguous contained extraluminal stool collection communicating with the rectosigmoid colon, collection overall roughly 7 x 8 x 9 cm.   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   12/26/2016 Imaging   IMPRESSION: 1. Stable CT exam of the chest.  No evidence for metastatic disease. 2. Interval left colectomy with left abdominal end colostomy. Presacral soft tissue is similar to prior study when taking into account the interval surgery. 3. Hepatic dome lesion seen on the previous study not evident today, potentially related to bolus timing. The 2.4 cm subcapsular inferior right liver lesion remains subtle and is barely visible on today's exam but shows no substantial interval change in size. Continued attention on follow-up recommended.    12/09/2017 Imaging   CT CAP W Contrast 12/09/17  IMPRESSION: 1. Stable post treatment changes in the presacral space with no evidence of recurrent disease in the pelvis. 2. No evidence of metastatic disease in the chest, abdomen or pelvis. 3. Large parastomal hernia has increased in size and contains much of the remnant left colon. No evidence of bowel obstruction or ischemia. 4. Stable dilated main pulmonary artery, suggesting chronic pulmonary arterial hypertension. 5.  Aortic Atherosclerosis (ICD10-I70.0).   12/31/2017 Genetic Testing   The Multi-Cancer Panel offered by [facility] includes sequencing and/or deletion duplication testing of the following 83 genes: ALK, APC, ATM, AXIN2,BAP1,  BARD1, BLM, BMPR1A, BRCA1, BRCA2, BRIP1, CASR, CDC73, CDH1, CDK4, CDKN1B, CDKN1C,  CDKN2A (p14ARF), CDKN2A (p16INK4a), CEBPA, CHEK2, CTNNA1, DICER1, DIS3L2, EGFR (c.2369C>T, p.Thr790Met variant only), EPCAM (Deletion/duplication testing only), FH, FLCN, GATA2, GPC3, GREM1 (Promoter region deletion/duplication testing only), HOXB13 (c.251G>A, p.Gly84Glu), HRAS, KIT, MAX, MEN1, MET, MITF (c.952G>A, p.Glu318Lys variant only), MLH1, MSH2, MSH3, MSH6, MUTYH, NBN, NF1, NF2, NTHL1, PALB2, PDGFRA, PHOX2B, PMS2, POLD1, POLE, POT1, PRKAR1A, PTCH1, PTEN, RAD50, RAD51C, RAD51D, RB1, RECQL4, RET, RUNX1, SDHAF2, SDHA (sequence changes only), SDHB, SDHC, SDHD, SMAD4, SMARCA4, SMARCB1, SMARCE1, STK11, SUFU, TERC, TERT, TMEM127, TP53, TSC1, TSC2, VHL, WRN and WT1.   Results: Negative, no pathogenic variants identified.  The date of this test report is 12/31/2017.    02/02/2019 Imaging   CT CAP W Contrast  IMPRESSION: 1. Status post descending colostomy, without recurrent or metastatic disease. 2. Presacral soft tissue thickening is similar in configuration. New tiny foci of gas within the presacral soft tissues, nonspecific. No well-defined fistulous communication to bowel identified. 3. Possible constipation. 4. Probable mild hepatic steatosis. 5. Interval repair of parastomal hernia.   Aortic Atherosclerosis (ICD10-I70.0).   06/18/2019 Imaging   CT AP W Contrast  IMPRESSION: 1. Stable post treatment changes in presacral region. No evidence of recurrent or metastatic carcinoma within the abdomen or pelvis. 2. New osteolysis involving the superior and inferior vertebral endplates at P5-0, suspicious for infectious discitis. Recommend clinical correlation, and consider lumbar spine MRI without and with contrast for further evaluation.   These results will be called to the ordering clinician or representative by the Radiologist Assistant, and communication documented in the PACS or  zVision Dashboard.    06/26/2019 Imaging    MRI Lumbar 06/26/19  IMPRESSION: Extensive abnormality at  L3-4 with bone marrow edema and enhancement as well as extensive endplate irregularity. There is paraspinous soft tissue thickening and enhancement as well as epidural thickening and enhancement. Findings are most likely due to chronic infection. Another possibility would be severe adjacent segment degeneration above the level of fusion at L4-5. Metastatic disease not considered likely.      CURRENT THERAPY:  Surveillance  INTERVAL HISTORY:  Lindsay Little is here for a follow up of rectal cancer. She presents to the clinic with her husband. She is clinically stable. Her back pain is controlled on fentanyl patch.  She is able to function at home, but her pain does get much worse if she overdoes activities. Still has vaginal bloody discharge and drainage with pelvic pain, stable.  She denies upper abdominal pain, bloating.  She is still on doxycycline, and has mild nausea from medicine.  Her appetite is fair.  No other new complaints.  Review of system otherwise negative.  MEDICAL HISTORY:  Past Medical History:  Diagnosis Date  . Anxiety   . Chemotherapy-induced neuropathy (HCC)    toes and fingers numbness and tingling  . Colorectal delayed anastomotic leak s/p resection & colostomy 07/12/2016 11/17/2015  . Colostomy in place [facility]) 07/12/2016   due to anastomosis breakdown w/ colovaginal fistula  . Colovaginal fistula s/p omentopexy repair 07/12/2016 07/10/2016  . Complication of anesthesia   . Depression   . Family history of adverse reaction to anesthesia    mother-- ponv  . Family history of breast cancer   . Family history of pancreatic cancer   . Family history of skin cancer   . Gastroenteritis 12/20/2015  . GERD (gastroesophageal reflux disease)   . Hardware complicating wound infection ([place]) 08/03/2019  . Hiatal hernia   . History of cancer chemotherapy 02-20-2015 to 03-30-2015  . History of cardiac murmur as a child   . History of chemotherapy   . History of chronic  gastritis   . History of hypertension    no issues since multiple abdminal sx's and chemo--- no medication since 12/ 2016  . History of TMJ disorder   . Hypercholesteremia   . Hypertension   . IBS (irritable bowel syndrome)    dx age 47  . Intermittent abdominal pain    post-op multiple abdominal sx's   . Microcytic anemia   . Mild sleep apnea    per pt study 2014  very mild osa , no cpap recommended, recommeded wt loss and sleep routine  . OA (osteoarthritis)    left knee /  left shoulder  . Obesity   . PONV (postoperative nausea and vomiting)    severe" needs Scopolamine PATCH"   . Portacath in place    right chest  . Rectal adenocarcinoma ([place]) oncologis-  dr Burr Medico-- after radiation/ chemo (ypT1, N0) --  no recurrence per last note 03/ 2018   dx 01-13-2015-- Stage IIIC (T3, N2, M0) post concurrent radiation and chemotherapy 02-20-2015 to 03-30-2015 /  05-25-2015 s/p  LAR w/ RSO (post-op complicated by late abscess and contained anatomotic leak w/ help percutaneous drainage and antibiotics)   . Rotator cuff tear, left   . S/P radiation therapy 02/20/15-03/30/15   colon/rectal  . Vitamin D deficiency   . Wears glasses   . Wears glasses     SURGICAL HISTORY: Past Surgical History:  Procedure Laterality Date  . ABDOMINAL  HYSTERECTOMY  1996   uterus and cervix  . BACK SURGERY  02/12/2018   lumbar surgery  . COLON RESECTION N/A 07/12/2016   Procedure: LAPAROSCOPIC LYSIS OF ADHESIONS, OMENTOPEXY, HAND ASSISTED RESECTION OF  COLON, END TO END ANASTOMOSIS, COLOSTOMY;  Surgeon: Michael Boston, MD;  Location: [facility];  Service: General;  Laterality: N/A;  . COMBINED HYSTEROSCOPY DIAGNOSTIC / D&C  x2 1990's  . DIAGNOSTIC LAPAROSCOPY  age 34 and age 83  . EUS N/A 01/18/2015   Procedure: LOWER ENDOSCOPIC ULTRASOUND (EUS);  Surgeon: Arta Silence, MD;  Location: [facility];  Service: Endoscopy;  Laterality: N/A;  . EXCISION OF SKIN TAG  11/17/2015   Procedure: EXCISION OF SKIN TAG;  Surgeon:  Michael Boston, MD;  Location: [facility];  Service: General;;  . ILEO LOOP COLOSTOMY CLOSURE N/A 11/17/2015   Procedure: LAPAROSCOPIC DIVERTING LOOP ILEOSTOMY  DRAINAGE OF PELVIC ABSCESS;  Surgeon: Michael Boston, MD;  Location: [facility];  Service: General;  Laterality: N/A;  . ILEOSTOMY CLOSURE N/A 05/31/2016   Procedure: TAKEDOWN LOOP ILEOSTOMY;  Surgeon: Michael Boston, MD;  Location: [facility];  Service: General;  Laterality: N/A;  . IMPACTION REMOVAL  11/17/2015   Procedure: DISIMPACTION REMOVAL;  Surgeon: Michael Boston, MD;  Location: [facility];  Service: General;;  . INSERTION OF MESH N/A 07/23/2018   Procedure: INSERTION OF MESH X2;  Surgeon: Michael Boston, MD;  Location: [facility];  Service: General;  Laterality: N/A;  . IR LUMBAR [place] W/IMG GUIDE  07/01/2019  . KNEE ARTHROSCOPY Left 1990's  . KNEE ARTHROSCOPY W/ MENISCECTOMY Left 09/14/2009   and chondroplasty debridement  . LAPAROSCOPIC LYSIS OF ADHESIONS  11/17/2015   Procedure: LAPAROSCOPIC LYSIS OF ADHESIONS;  Surgeon: Michael Boston, MD;  Location: [facility];  Service: General;;  . LUMBAR WOUND DEBRIDEMENT N/A 04/24/2018   Procedure: wound exploration, irrigation and debridement;  Surgeon: Eustace Moore, MD;  Location: [place];  Service: Neurosurgery;  Laterality: N/A;  wound exploration, irrigation and debridement  . LYSIS OF ADHESION N/A 07/23/2018   Procedure: LYSIS OF ADHESIONS;  Surgeon: Michael Boston, MD;  Location: [facility];  Service: General;  Laterality: N/A;  . PORT-A-CATH REMOVAL N/A 11/21/2016   Procedure: REMOVAL PORT-A-CATH;  Surgeon: Michael Boston, MD;  Location: [facility];  Service: General;  Laterality: N/A;  . PORTACATH PLACEMENT N/A 05/25/2015   Procedure: INSERTION PORT-A-CATH;  Surgeon: Michael Boston, MD;  Location: [facility];  Service: General;  Laterality: N/A;-remains inplace Right chest.  . ROTATOR CUFF REPAIR Right 2006  . TONSILLECTOMY  age 23  . VENTRAL HERNIA REPAIR N/A 07/23/2018   Procedure: LAPAROSCOPIC VENTRAL  WALL HERNIA REPAIR;  Surgeon: Michael Boston, MD;  [facility];  Service: General;  Laterality: N/A;  . XI ROBOTIC ASSISTED LOWER ANTERIOR RESECTION N/A 05/25/2015   Procedure: XI ROBOTIC ASSISTED LOWER ANTERIOR RESECTION, , RIGID PROCTOSCOPY, RIGHT OOPHORECTOMY;  Surgeon: Michael Boston, MD;  Location: [facility];  Service: General;  Laterality: N/A;    I have reviewed the social history and family history with the patient and they are unchanged from previous note.  ALLERGIES:  is allergic to caine-1 [lidocaine], sulfa antibiotics, adhesive [tape], doxycycline, flagyl [metronidazole], iron, oxycodone, and penicillins.  MEDICATIONS:  Current Outpatient Medications  Medication Sig Dispense Refill  . bisoprolol (ZEBETA) 10 MG tablet Take 10 mg by mouth daily.    . cefdinir (OMNICEF) 300 MG capsule Take 1 capsule (300 mg total) by mouth 2 (two) times daily. 60 capsule 11  . celecoxib (  CELEBREX) 200 MG capsule TAKE ONE CAPSULE BY MOUTH TWICE DAILY 60 capsule 0  . Cholecalciferol (VITAMIN D3) 5000 units CAPS Take 5,000 Units by mouth at bedtime.     . Coenzyme Q10 (COQ10) 200 MG CAPS Take 200 mg by mouth at bedtime.    Marland Kitchen doxycycline (VIBRA-TABS) 100 MG tablet Take 1 tablet (100 mg total) by mouth 2 (two) times daily. 60 tablet 11  . DULoxetine (CYMBALTA) 60 MG capsule Take 120 mg by mouth at bedtime.     Marland Kitchen esomeprazole (NEXIUM) 40 MG capsule Take 1 capsule (40 mg total) by mouth 2 (two) times daily before a meal. 60 capsule 5  . fentaNYL (DURAGESIC) 100 MCG/HR 1 patch every 3 (three) days.    . furosemide (LASIX) 20 MG tablet Take 20 mg by mouth daily as needed for edema.     . gabapentin (NEURONTIN) 800 MG tablet Take 800 mg by mouth 4 (four) times daily.    Marland Kitchen HYDROcodone-acetaminophen (NORCO) 10-325 MG tablet Take 1 tablet by mouth every 8 (eight) hours as needed for severe pain. 60 tablet 0  . levETIRAcetam (KEPPRA) 500 MG tablet Take 500 mg by mouth 2 (two) times daily.    . Melatonin 3 MG TABS  Take 3-6 mg by mouth at bedtime.     . Multiple Vitamin (MULTIVITAMIN WITH MINERALS) TABS tablet Take 2 tablets by mouth at bedtime.     . polyethylene glycol (MIRALAX / GLYCOLAX) packet Take 17 g by mouth daily as needed for moderate constipation.    . promethazine (PHENERGAN) 25 MG tablet TAKE ONE TABLET BY MOUTH EVERY 6 HOURS AS NEEDED FOR NAUSEA OR vomiting 60 tablet 0  . rosuvastatin (CRESTOR) 5 MG tablet Take 5 mg by mouth at bedtime.    . triamcinolone lotion (KENALOG) 0.1 % Apply 1 application topically as needed (dry skin).     No current facility-administered medications for this visit.   Facility-Administered Medications Ordered in Other Visits  Medication Dose Route Frequency Provider Last Rate Last Admin  . 0.9 %  sodium chloride infusion   Intravenous Once Truitt Merle, MD        PHYSICAL EXAMINATION: ECOG PERFORMANCE STATUS: 2 - Symptomatic, <50% confined to bed  Vitals:   12/24/19 1134  BP: (!) 140/54  Pulse: 67  Resp: 17  Temp: 98.6 F (37 C)   Filed Weights   12/24/19 1134  Weight: 297 lb 8 oz (134.9 kg)    GENERAL:alert, no distress and comfortable SKIN: skin color, texture, turgor are normal, no rashes or significant lesions EYES: normal, Conjunctiva are pink and non-injected, sclera clear NECK: supple, thyroid normal size, non-tender, without nodularity LYMPH:  no palpable lymphadenopathy in the cervical, axillary  LUNGS: clear to auscultation and percussion with normal breathing effort HEART: regular rate & rhythm and no murmurs and no lower extremity edema ABDOMEN:abdomen soft, non-tender and normal bowel sounds Musculoskeletal:no cyanosis of digits and no clubbing  NEURO: alert & oriented x 3 with fluent speech, no focal motor/sensory deficits  LABORATORY DATA:  I have reviewed the data as listed CBC Latest Ref Rng & Units 08/25/2019 08/23/2019 08/02/2019  WBC 4.0 - 10.5 K/uL 5.9 3.5(L) 4.5  Hemoglobin 12.0 - 15.0 g/dL 12.0 10.7(L) 11.3(L)  Hematocrit 36  - 46 % 38.2 36.2 37.6  Platelets 150 - 400 K/uL 227 190 208     CMP Latest Ref Rng & Units 08/25/2019 08/23/2019 08/21/2019  Glucose 70 - 99 mg/dL 108(H) 96 130(H)  BUN 6 -  20 mg/dL 7 12 8   Creatinine 0.44 - 1.00 mg/dL 0.77 0.65 0.69  Sodium 135 - 145 mmol/L 146(H) 141 141  Potassium 3.5 - 5.1 mmol/L 4.0 4.4 3.8  Chloride 98 - 111 mmol/L 104 103 104  CO2 22 - 32 mmol/L 31 32 29  Calcium 8.9 - 10.3 mg/dL 9.8 9.1 8.9  Total Protein 6.5 - 8.1 g/dL 7.0 - -  Total Bilirubin 0.3 - 1.2 mg/dL 0.3 - -  Alkaline Phos 38 - 126 U/L 86 - -  AST 15 - 41 U/L 20 - -  ALT 0 - 44 U/L 17 - -      RADIOGRAPHIC STUDIES: I have personally reviewed the radiological images as listed and agreed with the findings in the report. No results found.   ASSESSMENT & PLAN:  Lindsay Little is a 54 y.o. female with    1.Rectal adenocarcinoma T3N2M0, Stage IIIC , ypT1N0 after neoadjuvant chemotherapy and radiation -Diagnosed in 01/2015. Treated with surgerywith neoadjuvantchemo and radiation. She is currently on surveillance.She has permanent colostomy bag in place.  -She has previously had yellow/bloody draining of her vagina and rectum in 2019-2020. It was suggested byDr.Matthewat [facility] to have rectal resection due to her high rectovaginal fistular. I previously spoke with Dr. Johney Maine. He indicates that her rectum has been removed, no more surgery he can offer.  -07/01/19 bone biopsy was negative for malignancy. This is not related to her cancer.  -it has been 5 years since her initial diagnosis, she has completed cancer surveillance.  Her risk of recurrence is minimal now. -She will be discharged from my clinic, and follow-up with primary care physician and other specialists    2. Lower back pain, Lumbar spine L3-4 discitis  -Her CT AP from 06/18/19 shows no evidence of malignancy. She does have bone loss between L3-L4. Lumbar MRI from 06/26/19 shows evidence of chronic infection between L3-L4. The radiologist  is suspicious of infectious discitis  -07/01/19 biopsy results show Gram stain was negative for bacteria, and negative for malignant cells  -ID Dr Tommy Medal started treatment with IV antibiotics vancomycin and ceftriaxone through PICC line for 6 weeks starting 07/2019 which she had at home and completes on 08/25/19. She will continue f/u with ID, still on doxycycline  3. Rectal and Vaginal discharge, Urinary incontinence -She has incontinence from prior surgery.  -per Dr.Matthew, she has a high rectovaginal fistula.She has persistent discharge from rectal and vaginal,she wears depends and has to change it 1-2 times daily. -Her surgeon does not recommend further rectal surgery.  -f/u withDr. Rodman Key and Dr. Johney Maine  4. HTN -She'll continue follow-up with her primary care physician    Plan -She is clinically stable, no concern for cancer recurrence -Has completed 5 years cancer surveillance, will be discharged from my clinic today.   No problem-specific Assessment & Plan notes found for this encounter.   Orders Placed This Encounter  Procedures  . [facility] (IN HOUSE-[facility])   All questions were answered. The patient knows to call the clinic with any problems, questions or concerns. No barriers to learning was detected. The total time spent in the appointment was 25 minutes.     Truitt Merle, MD 12/26/2019   I, Joslyn Devon, am acting as scribe for Truitt Merle, MD.   I have reviewed the above documentation for accuracy and completeness, and I agree with the above.

## 2019-12-24 ENCOUNTER — Inpatient Hospital Stay (HOSPITAL_BASED_OUTPATIENT_CLINIC_OR_DEPARTMENT_OTHER): Payer: 59 | Admitting: Hematology

## 2019-12-24 ENCOUNTER — Other Ambulatory Visit: Payer: Self-pay

## 2019-12-24 ENCOUNTER — Inpatient Hospital Stay: Payer: 59 | Attending: Hematology

## 2019-12-24 VITALS — BP 140/54 | HR 67 | Temp 98.6°F | Resp 17 | Ht 70.0 in | Wt 297.5 lb

## 2019-12-24 DIAGNOSIS — Z803 Family history of malignant neoplasm of breast: Secondary | ICD-10-CM | POA: Diagnosis not present

## 2019-12-24 DIAGNOSIS — R32 Unspecified urinary incontinence: Secondary | ICD-10-CM | POA: Insufficient documentation

## 2019-12-24 DIAGNOSIS — Z90710 Acquired absence of both cervix and uterus: Secondary | ICD-10-CM | POA: Diagnosis not present

## 2019-12-24 DIAGNOSIS — I10 Essential (primary) hypertension: Secondary | ICD-10-CM

## 2019-12-24 DIAGNOSIS — K6289 Other specified diseases of anus and rectum: Secondary | ICD-10-CM | POA: Insufficient documentation

## 2019-12-24 DIAGNOSIS — N823 Fistula of vagina to large intestine: Secondary | ICD-10-CM | POA: Insufficient documentation

## 2019-12-24 DIAGNOSIS — M545 Low back pain: Secondary | ICD-10-CM | POA: Insufficient documentation

## 2019-12-24 DIAGNOSIS — Z800 Family history of malignant neoplasm of digestive organs: Secondary | ICD-10-CM | POA: Insufficient documentation

## 2019-12-24 DIAGNOSIS — C20 Malignant neoplasm of rectum: Secondary | ICD-10-CM

## 2019-12-24 DIAGNOSIS — Z9221 Personal history of antineoplastic chemotherapy: Secondary | ICD-10-CM | POA: Diagnosis not present

## 2019-12-24 DIAGNOSIS — Z933 Colostomy status: Secondary | ICD-10-CM | POA: Diagnosis not present

## 2019-12-24 DIAGNOSIS — Z923 Personal history of irradiation: Secondary | ICD-10-CM | POA: Diagnosis not present

## 2019-12-24 DIAGNOSIS — Z90721 Acquired absence of ovaries, unilateral: Secondary | ICD-10-CM | POA: Insufficient documentation

## 2019-12-24 DIAGNOSIS — Z85048 Personal history of other malignant neoplasm of rectum, rectosigmoid junction, and anus: Secondary | ICD-10-CM | POA: Diagnosis not present

## 2019-12-24 DIAGNOSIS — M4646 Discitis, unspecified, lumbar region: Secondary | ICD-10-CM | POA: Insufficient documentation

## 2019-12-24 DIAGNOSIS — Z808 Family history of malignant neoplasm of other organs or systems: Secondary | ICD-10-CM | POA: Diagnosis not present

## 2019-12-24 LAB — CEA (IN HOUSE-CHCC): CEA (CHCC-In House): <1 ng/mL (ref 0.00–5.00)

## 2019-12-26 ENCOUNTER — Encounter: Payer: Self-pay | Admitting: Hematology

## 2020-01-07 ENCOUNTER — Telehealth: Payer: Self-pay | Admitting: *Deleted

## 2020-01-07 NOTE — Telephone Encounter (Signed)
Received vm call from pt stating that she needs to make an appt with Dr Alinda Dooms care & Dr Burr Medico stated that she was at [facility] on Friday's but no one seems to know about this.  Message to Dr Hilma Favors to schedule pt.

## 2020-01-14 ENCOUNTER — Inpatient Hospital Stay: Payer: 59 | Attending: Hematology | Admitting: Internal Medicine

## 2020-01-14 ENCOUNTER — Other Ambulatory Visit: Payer: Self-pay

## 2020-01-14 DIAGNOSIS — Z515 Encounter for palliative care: Secondary | ICD-10-CM | POA: Diagnosis not present

## 2020-01-14 NOTE — Progress Notes (Addendum)
Patient Name: Lindsay Little  DOB: [birth year] Age: 54 y.o.  Attending Physician: Truitt Merle, MD MRN: [id number]    Primary Care Physician: [name], MD  Date: 01/14/2020  54 yo s/p successful treatment of rectal cancer,routine surviellence. Recent lumbar discitis resulting in significant pain and debility, she is requiring ongoing opioid management for severe pain.  Patient seen in follow up for palliative care. Supportive care and counseling provided during this visit including the following:  Disability paperwork completed  Pain management reviewed and discussed-mangaed by Dr. Cyril Mourning office  Discussed Survivorship Issues  Empathetic listening re: coping and managing serious illness and loss of function  Will see as needed for ongoing support, symptom management and survivorship needs.  Lane Hacker, DO Palliative Medicine  Time: 30 minutes Greater than 50%  of this time was spent counseling and coordinating care related to the above assessment and plan.

## 2020-01-14 NOTE — Patient Instructions (Signed)
Recommend visit with Lorrin Jackson, [place].  Continue current medications. No changes today.  Will e-mail paperwork when complete.

## 2020-01-18 ENCOUNTER — Encounter: Payer: Self-pay | Admitting: Hematology

## 2020-01-21 ENCOUNTER — Other Ambulatory Visit: Payer: Self-pay | Admitting: Infectious Disease

## 2020-01-21 DIAGNOSIS — R110 Nausea: Secondary | ICD-10-CM

## 2020-01-21 DIAGNOSIS — T847XXD Infection and inflammatory reaction due to other internal orthopedic prosthetic devices, implants and grafts, subsequent encounter: Secondary | ICD-10-CM

## 2020-01-22 ENCOUNTER — Other Ambulatory Visit: Payer: Self-pay | Admitting: Hematology

## 2020-01-22 DIAGNOSIS — C20 Malignant neoplasm of rectum: Secondary | ICD-10-CM

## 2020-01-25 NOTE — Telephone Encounter (Signed)
Per office visit note 12/24/2019, patient is discharged from MD practice. Please review for refill

## 2020-01-26 ENCOUNTER — Other Ambulatory Visit: Payer: Self-pay | Admitting: Infectious Disease

## 2020-01-26 DIAGNOSIS — C20 Malignant neoplasm of rectum: Secondary | ICD-10-CM

## 2020-02-15 ENCOUNTER — Other Ambulatory Visit: Payer: Self-pay | Admitting: Neurological Surgery

## 2020-02-15 DIAGNOSIS — M4646 Discitis, unspecified, lumbar region: Secondary | ICD-10-CM

## 2020-03-01 ENCOUNTER — Ambulatory Visit: Payer: 59 | Admitting: Infectious Disease

## 2020-03-11 ENCOUNTER — Other Ambulatory Visit: Payer: Self-pay | Admitting: Infectious Disease

## 2020-03-11 DIAGNOSIS — R110 Nausea: Secondary | ICD-10-CM

## 2020-03-11 DIAGNOSIS — T847XXD Infection and inflammatory reaction due to other internal orthopedic prosthetic devices, implants and grafts, subsequent encounter: Secondary | ICD-10-CM

## 2020-04-10 DIAGNOSIS — Z23 Encounter for immunization: Secondary | ICD-10-CM | POA: Diagnosis not present

## 2020-05-10 DIAGNOSIS — Z03818 Encounter for observation for suspected exposure to other biological agents ruled out: Secondary | ICD-10-CM | POA: Diagnosis not present

## 2020-05-16 DIAGNOSIS — Z6841 Body Mass Index (BMI) 40.0 and over, adult: Secondary | ICD-10-CM | POA: Diagnosis not present

## 2020-05-16 DIAGNOSIS — Z79899 Other long term (current) drug therapy: Secondary | ICD-10-CM | POA: Diagnosis not present

## 2020-05-16 DIAGNOSIS — Z933 Colostomy status: Secondary | ICD-10-CM | POA: Diagnosis not present

## 2020-05-16 DIAGNOSIS — R112 Nausea with vomiting, unspecified: Secondary | ICD-10-CM | POA: Diagnosis not present

## 2020-05-16 DIAGNOSIS — K219 Gastro-esophageal reflux disease without esophagitis: Secondary | ICD-10-CM | POA: Diagnosis not present

## 2020-05-18 NOTE — Telephone Encounter (Signed)
Reached out to patient and make her aware of Dr. [name]'s recommendation.  Patient accepts to continue oral antibiotics and will schedule appointment with Dr. Tommy Medal. Call transferred to scheduling.  Lindsay Little

## 2020-05-25 DIAGNOSIS — G894 Chronic pain syndrome: Secondary | ICD-10-CM | POA: Diagnosis not present

## 2020-05-25 DIAGNOSIS — M869 Osteomyelitis, unspecified: Secondary | ICD-10-CM | POA: Diagnosis not present

## 2020-05-25 DIAGNOSIS — M5136 Other intervertebral disc degeneration, lumbar region: Secondary | ICD-10-CM | POA: Diagnosis not present

## 2020-05-27 ENCOUNTER — Other Ambulatory Visit: Payer: Self-pay | Admitting: Infectious Disease

## 2020-05-27 DIAGNOSIS — R110 Nausea: Secondary | ICD-10-CM

## 2020-05-27 DIAGNOSIS — T847XXD Infection and inflammatory reaction due to other internal orthopedic prosthetic devices, implants and grafts, subsequent encounter: Secondary | ICD-10-CM

## 2020-06-22 DIAGNOSIS — Z03818 Encounter for observation for suspected exposure to other biological agents ruled out: Secondary | ICD-10-CM | POA: Diagnosis not present

## 2020-07-04 ENCOUNTER — Other Ambulatory Visit: Payer: Self-pay | Admitting: Infectious Disease

## 2020-07-04 DIAGNOSIS — T847XXD Infection and inflammatory reaction due to other internal orthopedic prosthetic devices, implants and grafts, subsequent encounter: Secondary | ICD-10-CM

## 2020-07-04 DIAGNOSIS — R110 Nausea: Secondary | ICD-10-CM

## 2020-07-17 ENCOUNTER — Ambulatory Visit: Payer: 59 | Admitting: Infectious Disease

## 2020-07-21 DIAGNOSIS — G4489 Other headache syndrome: Secondary | ICD-10-CM | POA: Diagnosis not present

## 2020-07-21 DIAGNOSIS — Z1152 Encounter for screening for COVID-19: Secondary | ICD-10-CM | POA: Diagnosis not present

## 2020-08-23 ENCOUNTER — Other Ambulatory Visit: Payer: Self-pay | Admitting: Infectious Disease

## 2020-08-23 DIAGNOSIS — T847XXD Infection and inflammatory reaction due to other internal orthopedic prosthetic devices, implants and grafts, subsequent encounter: Secondary | ICD-10-CM

## 2020-08-23 DIAGNOSIS — R110 Nausea: Secondary | ICD-10-CM

## 2020-08-24 ENCOUNTER — Telehealth: Payer: Self-pay | Admitting: *Deleted

## 2020-08-24 NOTE — Telephone Encounter (Signed)
Received refill request for ondansetron. Per chart, patient in ER for nausea, GI issues. RN denied refill request with message for patient to contact [facility]. Landis Gandy, RN

## 2020-10-12 ENCOUNTER — Emergency Department (HOSPITAL_BASED_OUTPATIENT_CLINIC_OR_DEPARTMENT_OTHER): Payer: Managed Care, Other (non HMO)

## 2020-10-12 ENCOUNTER — Encounter (HOSPITAL_BASED_OUTPATIENT_CLINIC_OR_DEPARTMENT_OTHER): Payer: Self-pay | Admitting: *Deleted

## 2020-10-12 ENCOUNTER — Inpatient Hospital Stay (HOSPITAL_BASED_OUTPATIENT_CLINIC_OR_DEPARTMENT_OTHER)
Admission: EM | Admit: 2020-10-12 | Discharge: 2020-10-17 | DRG: 389 | Disposition: A | Discharge status: Home / Self Care | Payer: Managed Care, Other (non HMO) | Attending: Internal Medicine | Admitting: Internal Medicine

## 2020-10-12 ENCOUNTER — Other Ambulatory Visit: Payer: Self-pay

## 2020-10-12 DIAGNOSIS — Z933 Colostomy status: Secondary | ICD-10-CM | POA: Diagnosis not present

## 2020-10-12 DIAGNOSIS — Z9049 Acquired absence of other specified parts of digestive tract: Secondary | ICD-10-CM | POA: Diagnosis not present

## 2020-10-12 DIAGNOSIS — Z9221 Personal history of antineoplastic chemotherapy: Secondary | ICD-10-CM | POA: Diagnosis not present

## 2020-10-12 DIAGNOSIS — K56609 Unspecified intestinal obstruction, unspecified as to partial versus complete obstruction: Secondary | ICD-10-CM | POA: Diagnosis present

## 2020-10-12 DIAGNOSIS — I10 Essential (primary) hypertension: Secondary | ICD-10-CM | POA: Diagnosis present

## 2020-10-12 DIAGNOSIS — G894 Chronic pain syndrome: Secondary | ICD-10-CM | POA: Diagnosis not present

## 2020-10-12 DIAGNOSIS — M4646 Discitis, unspecified, lumbar region: Secondary | ICD-10-CM | POA: Diagnosis present

## 2020-10-12 DIAGNOSIS — Z87891 Personal history of nicotine dependence: Secondary | ICD-10-CM | POA: Diagnosis not present

## 2020-10-12 DIAGNOSIS — K5669 Other partial intestinal obstruction: Secondary | ICD-10-CM | POA: Diagnosis not present

## 2020-10-12 DIAGNOSIS — K219 Gastro-esophageal reflux disease without esophagitis: Secondary | ICD-10-CM | POA: Diagnosis present

## 2020-10-12 DIAGNOSIS — Z85048 Personal history of other malignant neoplasm of rectum, rectosigmoid junction, and anus: Secondary | ICD-10-CM | POA: Diagnosis not present

## 2020-10-12 DIAGNOSIS — E876 Hypokalemia: Secondary | ICD-10-CM | POA: Diagnosis present

## 2020-10-12 DIAGNOSIS — Z90721 Acquired absence of ovaries, unilateral: Secondary | ICD-10-CM

## 2020-10-12 DIAGNOSIS — Z20822 Contact with and (suspected) exposure to covid-19: Secondary | ICD-10-CM | POA: Diagnosis not present

## 2020-10-12 DIAGNOSIS — Z79891 Long term (current) use of opiate analgesic: Secondary | ICD-10-CM

## 2020-10-12 DIAGNOSIS — E669 Obesity, unspecified: Secondary | ICD-10-CM | POA: Diagnosis not present

## 2020-10-12 DIAGNOSIS — Z90710 Acquired absence of both cervix and uterus: Secondary | ICD-10-CM | POA: Diagnosis not present

## 2020-10-12 DIAGNOSIS — Z8719 Personal history of other diseases of the digestive system: Secondary | ICD-10-CM | POA: Diagnosis present

## 2020-10-12 DIAGNOSIS — E7800 Pure hypercholesterolemia, unspecified: Secondary | ICD-10-CM | POA: Diagnosis present

## 2020-10-12 DIAGNOSIS — Z803 Family history of malignant neoplasm of breast: Secondary | ICD-10-CM | POA: Diagnosis not present

## 2020-10-12 DIAGNOSIS — Z800 Family history of malignant neoplasm of digestive organs: Secondary | ICD-10-CM | POA: Diagnosis not present

## 2020-10-12 DIAGNOSIS — Z6841 Body Mass Index (BMI) 40.0 and over, adult: Secondary | ICD-10-CM | POA: Diagnosis not present

## 2020-10-12 DIAGNOSIS — E785 Hyperlipidemia, unspecified: Secondary | ICD-10-CM | POA: Diagnosis not present

## 2020-10-12 DIAGNOSIS — Z79899 Other long term (current) drug therapy: Secondary | ICD-10-CM | POA: Diagnosis not present

## 2020-10-12 DIAGNOSIS — Z923 Personal history of irradiation: Secondary | ICD-10-CM | POA: Diagnosis not present

## 2020-10-12 DIAGNOSIS — R110 Nausea: Secondary | ICD-10-CM

## 2020-10-12 DIAGNOSIS — Z808 Family history of malignant neoplasm of other organs or systems: Secondary | ICD-10-CM | POA: Diagnosis not present

## 2020-10-12 LAB — COMPREHENSIVE METABOLIC PANEL
ALT: 27 U/L (ref 0–44)
AST: 23 U/L (ref 15–41)
Albumin: 4.6 g/dL (ref 3.5–5.0)
Alkaline Phosphatase: 73 U/L (ref 38–126)
Anion gap: 15 (ref 5–15)
BUN: 15 mg/dL (ref 6–20)
CO2: 24 mmol/L (ref 22–32)
Calcium: 10.3 mg/dL (ref 8.9–10.3)
Chloride: 98 mmol/L (ref 98–111)
Creatinine, Ser: 0.87 mg/dL (ref 0.44–1.00)
GFR, Estimated: >60 mL/min (ref >60)
Glucose, Bld: 137 mg/dL — ABNORMAL HIGH (ref 70–99)
Potassium: 3.3 mmol/L — ABNORMAL LOW (ref 3.5–5.1)
Sodium: 137 mmol/L (ref 135–145)
Total Bilirubin: 1 mg/dL (ref 0.3–1.2)
Total Protein: 8.1 g/dL (ref 6.5–8.1)

## 2020-10-12 LAB — CBC WITH DIFFERENTIAL/PLATELET
Abs Immature Granulocytes: 0.01 10*3/uL (ref 0.00–0.07)
Basophils Absolute: 0 10*3/uL (ref 0.0–0.1)
Basophils Relative: 0 %
Eosinophils Absolute: 0.1 10*3/uL (ref 0.0–0.5)
Eosinophils Relative: 1 %
HCT: 43.9 % (ref 36.0–46.0)
Hemoglobin: 14.3 g/dL (ref 12.0–15.0)
Immature Granulocytes: 0 %
Lymphocytes Relative: 14 %
Lymphs Abs: 1.1 10*3/uL (ref 0.7–4.0)
MCH: 27.6 pg (ref 26.0–34.0)
MCHC: 32.6 g/dL (ref 30.0–36.0)
MCV: 84.6 fL (ref 80.0–100.0)
Monocytes Absolute: 0.5 10*3/uL (ref 0.1–1.0)
Monocytes Relative: 7 %
Neutro Abs: 6.2 10*3/uL (ref 1.7–7.7)
Neutrophils Relative %: 78 %
Platelets: 291 10*3/uL (ref 150–400)
RBC: 5.19 MIL/uL — ABNORMAL HIGH (ref 3.87–5.11)
RDW: 13.2 % (ref 11.5–15.5)
WBC: 8 10*3/uL (ref 4.0–10.5)
nRBC: 0 % (ref 0.0–0.2)

## 2020-10-12 LAB — RESP PANEL BY RT-PCR (FLU A&B, COVID) ARPGX2
Influenza A by PCR: NEGATIVE
Influenza B by PCR: NEGATIVE
SARS Coronavirus 2 by RT PCR: NEGATIVE

## 2020-10-12 LAB — MAGNESIUM: Magnesium: 1.8 mg/dL (ref 1.7–2.4)

## 2020-10-12 LAB — LIPASE, BLOOD: Lipase: 28 U/L (ref 11–51)

## 2020-10-12 LAB — PHOSPHORUS: Phosphorus: 2.1 mg/dL — ABNORMAL LOW (ref 2.5–4.6)

## 2020-10-12 MED ORDER — SODIUM CHLORIDE 0.9 % IV BOLUS
1000.0000 mL | Freq: Once | INTRAVENOUS | Status: AC
  Administered 2020-10-12: 1000 mL via INTRAVENOUS

## 2020-10-12 MED ORDER — POTASSIUM CHLORIDE 10 MEQ/100ML IV SOLN
10.0000 meq | INTRAVENOUS | Status: AC
  Administered 2020-10-12 – 2020-10-13 (×2): 10 meq via INTRAVENOUS
  Filled 2020-10-12 (×2): qty 100

## 2020-10-12 MED ORDER — ONDANSETRON HCL 4 MG/2ML IJ SOLN
4.0000 mg | Freq: Once | INTRAMUSCULAR | Status: AC
  Administered 2020-10-12: 4 mg via INTRAVENOUS
  Filled 2020-10-12: qty 2

## 2020-10-12 MED ORDER — IOHEXOL 300 MG/ML  SOLN
100.0000 mL | Freq: Once | INTRAMUSCULAR | Status: AC | PRN
  Administered 2020-10-12: 100 mL via INTRAVENOUS

## 2020-10-12 MED ORDER — PROMETHAZINE HCL 25 MG/ML IJ SOLN
INTRAMUSCULAR | Status: AC
  Filled 2020-10-12: qty 1

## 2020-10-12 MED ORDER — POTASSIUM CHLORIDE IN NACL 20-0.9 MEQ/L-% IV SOLN
INTRAVENOUS | Status: AC
  Filled 2020-10-12: qty 1000

## 2020-10-12 MED ORDER — MAGNESIUM SULFATE 2 GM/50ML IV SOLN
2.0000 g | Freq: Once | INTRAVENOUS | Status: AC
  Administered 2020-10-13: 2 g via INTRAVENOUS
  Filled 2020-10-12: qty 50

## 2020-10-12 MED ORDER — METOCLOPRAMIDE HCL 5 MG/ML IJ SOLN
10.0000 mg | Freq: Once | INTRAMUSCULAR | Status: AC
  Administered 2020-10-12: 10 mg via INTRAVENOUS
  Filled 2020-10-12: qty 2

## 2020-10-12 MED ORDER — LACTATED RINGERS IV BOLUS: 1000.0000 mL | Freq: Once | INTRAVENOUS | Status: DC

## 2020-10-12 MED ORDER — ONDANSETRON HCL 4 MG/2ML IJ SOLN
INTRAMUSCULAR | Status: AC
  Filled 2020-10-12: qty 2

## 2020-10-12 MED ORDER — ONDANSETRON HCL 4 MG/2ML IJ SOLN
4.0000 mg | Freq: Once | INTRAMUSCULAR | Status: AC
  Administered 2020-10-12: 4 mg via INTRAVENOUS

## 2020-10-12 MED ORDER — FENTANYL CITRATE (PF) 100 MCG/2ML IJ SOLN
100.0000 ug | Freq: Once | INTRAMUSCULAR | Status: AC
  Administered 2020-10-12: 100 ug via INTRAVENOUS
  Filled 2020-10-12: qty 2

## 2020-10-12 MED ORDER — SODIUM CHLORIDE 0.9 % IV SOLN
25.0000 mg | Freq: Once | INTRAVENOUS | Status: AC
  Administered 2020-10-12: 25 mg via INTRAVENOUS
  Filled 2020-10-12: qty 1

## 2020-10-12 MED ORDER — POTASSIUM CHLORIDE IN NACL 20-0.9 MEQ/L-% IV SOLN
Freq: Once | INTRAVENOUS | Status: AC
  Filled 2020-10-12: qty 1000

## 2020-10-12 NOTE — ED Notes (Signed)
Pt is hard stick -  We just got her line at right forearm at 2220 - it was informed to Ms. Cletus Gash RN  About the delays of the IV medicines and IVF.

## 2020-10-12 NOTE — ED Notes (Signed)
Pt is hard stick - we got her access at right forearm  At 2220 -  It was informed to Ms.  Cletus Gash RN  About the delay of IV meds and IVF

## 2020-10-12 NOTE — ED Notes (Signed)
ED Provider at bedside. 

## 2020-10-12 NOTE — ED Notes (Signed)
Correction patient going to [place] [phone]

## 2020-10-12 NOTE — ED Notes (Signed)
Called [facility] to transport patient to [facility] [phone]

## 2020-10-12 NOTE — ED Provider Notes (Signed)
[place] EMERGENCY DEPT Provider Note   CSN: [id number] Arrival date & time: 10/12/20  1556     History Chief Complaint  Patient presents with  . Vomiting    5 Days    Lindsay Little is a 55 y.o. female.  HPI 55 year old female presents with abdominal pain and vomiting.  Started about 5 days ago.  She has not been eating and drinking much because of this.  No fevers, chest pain, shortness of breath or urinary symptoms.  She has not had any diarrhea through her ostomy and maybe a little less output.  However she is still having output.  For a couple days it felt like her abdomen was firm and uncomfortable though that has improved.  She continues to have vomiting and feels like the ondansetron that was called in for her is making it worse.  She also has Phenergan.   Past Medical History:  Diagnosis Date  . Anxiety   . Chemotherapy-induced neuropathy (HCC)    toes and fingers numbness and tingling  . Colorectal delayed anastomotic leak s/p resection & colostomy 07/12/2016 11/17/2015  . Colostomy in place [facility]) 07/12/2016   due to anastomosis breakdown w/ colovaginal fistula  . Colovaginal fistula s/p omentopexy repair 07/12/2016 07/10/2016  . Complication of anesthesia   . Depression   . Family history of adverse reaction to anesthesia    mother-- ponv  . Family history of breast cancer   . Family history of pancreatic cancer   . Family history of skin cancer   . Gastroenteritis 12/20/2015  . GERD (gastroesophageal reflux disease)   . Hardware complicating wound infection ([place]) 08/03/2019  . Hiatal hernia   . History of cancer chemotherapy 02-20-2015 to 03-30-2015  . History of cardiac murmur as a child   . History of chemotherapy   . History of chronic gastritis   . History of hypertension    no issues since multiple abdminal sx's and chemo--- no medication since 12/ 2016  . History of TMJ disorder   . Hypercholesteremia   . Hypertension   . IBS  (irritable bowel syndrome)    dx age 73  . Intermittent abdominal pain    post-op multiple abdominal sx's   . Microcytic anemia   . Mild sleep apnea    per pt study 2014  very mild osa , no cpap recommended, recommeded wt loss and sleep routine  . OA (osteoarthritis)    left knee /  left shoulder  . Obesity   . PONV (postoperative nausea and vomiting)    severe" needs Scopolamine PATCH"   . Portacath in place    right chest  . Rectal adenocarcinoma ([place]) oncologis-  dr Burr Medico-- after radiation/ chemo (ypT1, N0) --  no recurrence per last note 03/ 2018   dx 01-13-2015-- Stage IIIC (T3, N2, M0) post concurrent radiation and chemotherapy 02-20-2015 to 03-30-2015 /  05-25-2015 s/p  LAR w/ RSO (post-op complicated by late abscess and contained anatomotic leak w/ help percutaneous drainage and antibiotics)   . Rotator cuff tear, left   . S/P radiation therapy 02/20/15-03/30/15   colon/rectal  . Vitamin D deficiency   . Wears glasses   . Wears glasses     Patient Active Problem List   Diagnosis Date Noted  . Hardware complicating wound infection ([place]) 08/03/2019  . Discitis 07/15/2019  . Chronic pain syndrome 07/24/2018  . Parastomal hernia s/p lap repair w mesh 07/23/2018 07/23/2018  . 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  . Ano-peritoneal  fistula 07/23/2018  . Seroma of musculoskeletal structure after non-musculoskeletal system procedure 04/24/2018  . S/P lumbar spinal fusion 02/12/2018  . Genetic testing 01/12/2018  . Family history of pancreatic cancer   . Family history of skin cancer   . Family history of breast cancer   . Colostomy in place [facility]) 07/12/2016  . Rectocutaneous fistula 07/10/2016  . Hydroureter on right 12/20/2015  . Abdominal pain 12/19/2015  . Hypokalemia 11/23/2015  . Pelvic abscess s/p drainage & omental pedicle flap 11/17/2015 06/23/2015  . Anemia of chronic disease 06/14/2015  . Hypertension   . IBS (irritable bowel syndrome)   . Depression   . Cavernous  hemangioma of liver - segments 5 & 7 02/07/2015  . Osteoarthritis of left knee 02/07/2015  . Crohn's disease ([place]) 02/07/2015  . Rectal adenocarcinoma s/p LAR resection 05/25/2015 01/31/2015  . Pure hypercholesterolemia 02/21/2014  . Morbid obesity with body mass index of 40.0-49.9 ([place]) 02/21/2014  . Family history of ischemic heart disease 02/21/2014    Past Surgical History:  Procedure Laterality Date  . ABDOMINAL HYSTERECTOMY  1996   uterus and cervix  . BACK SURGERY  02/12/2018   lumbar surgery  . COLON RESECTION N/A 07/12/2016   Procedure: LAPAROSCOPIC LYSIS OF ADHESIONS, OMENTOPEXY, HAND ASSISTED RESECTION OF  COLON, END TO END ANASTOMOSIS, COLOSTOMY;  Surgeon: Michael Boston, MD;  Location: [facility];  Service: General;  Laterality: N/A;  . COMBINED HYSTEROSCOPY DIAGNOSTIC / D&C  x2 1990's  . DIAGNOSTIC LAPAROSCOPY  age 33 and age 16  . EUS N/A 01/18/2015   Procedure: LOWER ENDOSCOPIC ULTRASOUND (EUS);  Surgeon: Arta Silence, MD;  Location: [facility];  Service: Endoscopy;  Laterality: N/A;  . EXCISION OF SKIN TAG  11/17/2015   Procedure: EXCISION OF SKIN TAG;  Surgeon: Michael Boston, MD;  Location: [facility];  Service: General;;  . ILEO LOOP COLOSTOMY CLOSURE N/A 11/17/2015   Procedure: LAPAROSCOPIC DIVERTING LOOP ILEOSTOMY  DRAINAGE OF PELVIC ABSCESS;  Surgeon: Michael Boston, MD;  Location: [facility];  Service: General;  Laterality: N/A;  . ILEOSTOMY CLOSURE N/A 05/31/2016   Procedure: TAKEDOWN LOOP ILEOSTOMY;  Surgeon: Michael Boston, MD;  Location: [facility];  Service: General;  Laterality: N/A;  . IMPACTION REMOVAL  11/17/2015   Procedure: DISIMPACTION REMOVAL;  Surgeon: Michael Boston, MD;  Location: [facility];  Service: General;;  . INSERTION OF MESH N/A 07/23/2018   Procedure: INSERTION OF MESH X2;  Surgeon: Michael Boston, MD;  Location: [facility];  Service: General;  Laterality: N/A;  . IR LUMBAR [place] W/IMG GUIDE  07/01/2019  . KNEE ARTHROSCOPY Left 1990's  . KNEE ARTHROSCOPY W/  MENISCECTOMY Left 09/14/2009   and chondroplasty debridement  . LAPAROSCOPIC LYSIS OF ADHESIONS  11/17/2015   Procedure: LAPAROSCOPIC LYSIS OF ADHESIONS;  Surgeon: Michael Boston, MD;  Location: [facility];  Service: General;;  . LUMBAR WOUND DEBRIDEMENT N/A 04/24/2018   Procedure: wound exploration, irrigation and debridement;  Surgeon: Eustace Moore, MD;  Location: [facility];  Service: Neurosurgery;  Laterality: N/A;  wound exploration, irrigation and debridement  . LYSIS OF ADHESION N/A 07/23/2018   Procedure: LYSIS OF ADHESIONS;  Surgeon: Michael Boston, MD;  Location: [facility];  Service: General;  Laterality: N/A;  . PORT-A-CATH REMOVAL N/A 11/21/2016   Procedure: REMOVAL PORT-A-CATH;  Surgeon: Michael Boston, MD;  Location: [facility];  Service: General;  Laterality: N/A;  . PORTACATH PLACEMENT N/A 05/25/2015   Procedure: INSERTION PORT-A-CATH;  Surgeon: Michael Boston, MD;  Location: [facility];  Service: General;  Laterality: N/A;-remains inplace Right chest.  . ROTATOR CUFF REPAIR Right 2006  . TONSILLECTOMY  age 41  . VENTRAL HERNIA REPAIR N/A 07/23/2018   Procedure: LAPAROSCOPIC VENTRAL WALL HERNIA REPAIR;  Surgeon: Michael Boston, MD;  Location: [facility];  Service: General;  Laterality: N/A;  . XI ROBOTIC ASSISTED LOWER ANTERIOR RESECTION N/A 05/25/2015   Procedure: XI ROBOTIC ASSISTED LOWER ANTERIOR RESECTION, , RIGID PROCTOSCOPY, RIGHT OOPHORECTOMY;  Surgeon: Michael Boston, MD;  Location: [facility];  Service: General;  Laterality: N/A;     OB History   No obstetric history on file.     Family History  Problem Relation Age of Onset  . Coronary artery disease Mother 54  . Hypertension Mother   . Hyperlipidemia Mother   . Diabetes Mellitus I Mother   . Coronary artery disease Father   . Hyperlipidemia Father   . Hypertension Father   . Cancer Sister        skin - non melanoma  . Hyperlipidemia Brother   . Cancer Maternal Uncle 79       pancreatic with mets to colon and prostate  .  Cirrhosis Maternal Uncle   . Hypertension Maternal Grandmother   . Diabetes Mellitus I Maternal Grandmother   . Hyperlipidemia Maternal Grandmother   . CVA Maternal Grandmother   . Hypertension Maternal Grandfather   . Coronary artery disease Maternal Grandfather 65  . Hyperlipidemia Maternal Grandfather   . Coronary artery disease Paternal Grandmother   . Hypertension Paternal Grandmother   . Hyperlipidemia Paternal Grandmother   . Diabetes Mellitus I Paternal Grandmother   . Hypertension Paternal Grandfather   . Hyperlipidemia Paternal Grandfather   . Coronary artery disease Paternal Grandfather     Social History   Tobacco Use  . Smoking status: Former Smoker    Packs/day: 0.25    Years: 7.00    Pack years: 1.75    Types: Cigarettes    Quit date: 07/16/1991    Years since quitting: 29.2  . Smokeless tobacco: Never Used  Vaping Use  . Vaping Use: Never used  Substance Use Topics  . Alcohol use: No  . Drug use: Never    Home Medications Prior to Admission medications   Medication Sig Start Date End Date Taking? Authorizing Provider  bisoprolol (ZEBETA) 10 MG tablet Take 10 mg by mouth daily. 11/30/19   [provider]  cefdinir (OMNICEF) 300 MG capsule Take 1 capsule (300 mg total) by mouth 2 (two) times daily. 08/03/19   Truman Hayward, MD  celecoxib (CELEBREX) 200 MG capsule TAKE ONE CAPSULE BY MOUTH TWICE DAILY 12/22/19   Truitt Merle, MD  Cholecalciferol (VITAMIN D3) 5000 units CAPS Take 5,000 Units by mouth at bedtime.     [provider]  Coenzyme Q10 (COQ10) 200 MG CAPS Take 200 mg by mouth at bedtime.    [provider]  doxycycline (VIBRA-TABS) 100 MG tablet Take 1 tablet (100 mg total) by mouth 2 (two) times daily. 08/03/19   Truman Hayward, MD  DULoxetine (CYMBALTA) 60 MG capsule Take 120 mg by mouth at bedtime.     [provider]  esomeprazole (NEXIUM) 40 MG capsule Take 1 capsule (40 mg total) by mouth 2 (two) times  daily before a meal. 12/22/15   Michael Boston, MD  fentaNYL (DURAGESIC) 100 MCG/HR 1 patch every 3 (three) days. 11/03/19   [provider]  furosemide (LASIX) 20 MG tablet Take 20 mg by mouth  daily as needed for edema.     [provider]  gabapentin (NEURONTIN) 800 MG tablet Take 800 mg by mouth 4 (four) times daily. 12/23/19   [provider]  HYDROcodone-acetaminophen (NORCO) 10-325 MG tablet Take 1 tablet by mouth every 8 (eight) hours as needed for severe pain. 08/25/19   Truitt Merle, MD  levETIRAcetam (KEPPRA) 500 MG tablet Take 500 mg by mouth 2 (two) times daily. 11/16/19   [provider]  Melatonin 3 MG TABS Take 3-6 mg by mouth at bedtime.     [provider]  Multiple Vitamin (MULTIVITAMIN WITH MINERALS) TABS tablet Take 2 tablets by mouth at bedtime.     [provider]  polyethylene glycol (MIRALAX / GLYCOLAX) packet Take 17 g by mouth daily as needed for moderate constipation.    [provider]  promethazine (PHENERGAN) 25 MG tablet TAKE ONE TABLET BY MOUTH EVERY 6 HOURS AS NEEDED FOR NAUSEA OR VOMITING 07/04/20   Tommy Medal, Lavell Islam, MD  rosuvastatin (CRESTOR) 5 MG tablet Take 5 mg by mouth at bedtime.    [provider]  triamcinolone lotion (KENALOG) 0.1 % Apply 1 application topically as needed (dry skin).    [provider]    Allergies    Caine-1 [lidocaine], Sulfa antibiotics, Adhesive [tape], Doxycycline, Flagyl [metronidazole], Iron, Oxycodone, and Penicillins  Review of Systems   Review of Systems  Constitutional: Negative for fever.  Respiratory: Negative for shortness of breath.   Cardiovascular: Negative for chest pain.  Gastrointestinal: Positive for abdominal pain, nausea and vomiting. Negative for diarrhea.  Genitourinary: Negative for dysuria and hematuria.  Musculoskeletal: Positive for back pain (chronic, unchanged).  All other systems reviewed and are negative.   Physical  Exam Updated Vital Signs BP 135/86 (BP Location: Left Arm)   Pulse 75   Temp 98.3 F (36.8 C) (Oral)   Resp 15   Ht 5' 11"  (1.803 m)   Wt 136.1 kg   SpO2 98%   BMI 41.84 kg/m   Physical Exam Vitals and nursing note reviewed.  Constitutional:      Appearance: She is well-developed. She is not ill-appearing or diaphoretic.  HENT:     Head: Normocephalic and atraumatic.     Right Ear: External ear normal.     Left Ear: External ear normal.     Nose: Nose normal.  Eyes:     General:        Right eye: No discharge.        Left eye: No discharge.  Cardiovascular:     Rate and Rhythm: Normal rate and regular rhythm.     Heart sounds: Normal heart sounds.  Pulmonary:     Effort: Pulmonary effort is normal.     Breath sounds: Normal breath sounds.  Abdominal:     Palpations: Abdomen is soft.     Tenderness: There is no abdominal tenderness.  Skin:    General: Skin is warm and dry.  Neurological:     Mental Status: She is alert.  Psychiatric:        Mood and Affect: Mood is not anxious.     ED Results / Procedures / Treatments   Labs (all labs ordered are listed, but only abnormal results are displayed) Labs Reviewed  COMPREHENSIVE METABOLIC PANEL - Abnormal; Notable for the following components:      Result Value   Potassium 3.3 (*)    Glucose, Bld 137 (*)    All other components within  normal limits  CBC WITH DIFFERENTIAL/PLATELET - Abnormal; Notable for the following components:   RBC 5.19 (*)    All other components within normal limits  RESP PANEL BY RT-PCR (FLU A&B, COVID) ARPGX2  LIPASE, BLOOD  URINALYSIS, ROUTINE W REFLEX MICROSCOPIC    EKG None  Radiology CT ABDOMEN PELVIS W CONTRAST  Result Date: 10/12/2020 CLINICAL DATA:  Nausea and vomiting. EXAM: CT ABDOMEN AND PELVIS WITH CONTRAST TECHNIQUE: Multidetector CT imaging of the abdomen and pelvis was performed using the standard protocol following bolus administration of intravenous contrast.  CONTRAST:  163m OMNIPAQUE IOHEXOL 300 MG/ML  SOLN IV contrast extravasation of 86 cc Omnipaque in the right arm. Examination is essentially a noncontrast exam, with only minimal intravascular contrast. COMPARISON:  CT 06/18/2019 FINDINGS: Lower chest: No acute airspace disease or pleural effusion. Hepatobiliary: Diffusely decreased hepatic density typical of steatosis. No focal hepatic abnormality. There is minimal layering hyperdensity in the gallbladder that may represent sludge or stones. No pericholecystic fat stranding. No biliary dilatation. Pancreas: Parenchymal atrophy. No ductal dilatation or inflammation. Spleen: Tiny central splenic hypodensities unchanged, nonspecific. Normal in size. Adrenals/Urinary Tract: Normal adrenal glands. No hydronephrosis or perinephric edema. No renal calculi. Urinary bladder is partially distended. Stomach/Bowel: Stomach distended with ingested contents/fluid. Small bowel are dilated and fluid-filled. There is fecalization of dilated small bowel contents in the pelvis. There is transition from dilated to nondilated in the right lower quadrant, though discrete transition point is not seen. No pneumatosis. Enteric sutures noted in the distal ileum. Formed stool in the ascending and transverse colon. Left mid abdominal colostomy. No significant peristomal hernia. Prior abdominal perineal resection of the distal colon. Stable presacral soft tissue density from prior exam. Vascular/Lymphatic: Abdominal aorta is normal in caliber. No aortic aneurysm. Retroperitoneal lymph nodes are not enlarged by size criteria. There is no pelvic adenopathy. Reproductive: Hysterectomy. Other: Similar appearance of presacral soft tissue density likely related to treatment. Within the left lower quadrant/adnexa is a elongated multiloculated fluid density structure spanning 5.1 cm in cranial caudal dimension. No surrounding inflammation. No soft tissue components. No free intra-abdominal air. No  significant mesenteric edema. Musculoskeletal: Postsurgical change at L4-L5. Lucency about the pedicle screws, more so on the right. The L4 pedicle screw extend to the L3-L4 disc space. Progressive sclerosis at L3-L4 from prior exam, previous osteomyelitis on spine MRI. No acute osseous abnormalities are seen. IMPRESSION: 1. Findings consistent with small bowel obstruction with transition in the right lower quadrant, though discrete transition point is not seen. 2. Post abdominal perineal resection with left mid abdominal colostomy. Stable post treatment related presacral soft tissue density. 3. Multi loculated fluid density structure in the left lower quadrant/adnexa measuring 5.1 cm without surrounding inflammation. This is favored to be sequela of prior surgery possible peritoneal inclusion cyst or lymphocele. Attention at follow-up recommended. 4. Hepatic steatosis. 5. Sequela of L3-L4 discitis osteomyelitis. 6. Minimal layering hyperdensity in the gallbladder may represent sludge or stones. No pericholecystic fat stranding. 7. IV contrast extravasation. Aortic Atherosclerosis (ICD10-I70.0). Electronically Signed   By: MKeith RakeM.D.   On: 10/12/2020 20:24    Procedures Ultrasound ED Peripheral IV (Provider)  Date/Time: 10/12/2020 7:33 PM Performed by: GSherwood Gambler MD Authorized by: GSherwood Gambler MD   Procedure details:    Indications: multiple failed IV attempts and poor IV access     Skin Prep: isopropyl alcohol     Location:  Right AC   Angiocath:  20 G   Bedside Ultrasound Guided: Yes  Patient tolerated procedure without complications: Yes     Dressing applied: Yes       Medications Ordered in ED Medications  fentaNYL (SUBLIMAZE) injection 100 mcg (has no administration in time range)  sodium chloride 0.9 % bolus 1,000 mL (0 mLs Intravenous Stopped 10/12/20 1732)  metoCLOPramide (REGLAN) injection 10 mg (10 mg Intravenous Given 10/12/20 1731)  iohexol (OMNIPAQUE) 300  MG/ML solution 100 mL (100 mLs Intravenous Contrast Given 10/12/20 1950)  promethazine (PHENERGAN) 25 mg in sodium chloride 0.9 % 50 mL IVPB (0 mg Intravenous Stopped 10/12/20 1927)  promethazine (PHENERGAN) 25 MG/ML injection (  Given by Other 10/12/20 2042)  ondansetron (ZOFRAN) injection 4 mg (4 mg Intravenous Given 10/12/20 2032)  0.9 % NaCl with KCl 20 mEq/ L  infusion ( Intravenous New Bag/Given 10/12/20 2104)    ED Course  I have reviewed the triage vital signs and the nursing notes.  Pertinent labs & imaging results that were available during my care of the patient were reviewed by me and considered in my medical decision making (see chart for details).    MDM Rules/Calculators/A&P                          Patient's nausea has now much improved after Reglan, Zofran, Phenergan.  She was given IV fluids.  CT shows small bowel obstruction.  I personally reviewed these images.  Otherwise, has some mild pain at this point and will give IV fentanyl.  Discussed with Dr. Dema Severin and he advises to hold on NG tube unless actively vomiting which she is not.  Thus will admit to medical service and she prefers [facility] given her surgeon works there (Dr. Johney Maine).  Admit to hospitalist service, discussed with Dr. Olevia Bowens. Final Clinical Impression(s) / ED Diagnoses Final diagnoses:  Small bowel obstruction ([place])  Hypokalemia    Rx / DC Orders ED Discharge Orders    None       Sherwood Gambler, MD 10/12/20 2117

## 2020-10-12 NOTE — ED Triage Notes (Signed)
Vomiting since Sunday.  Denies fever or diarrhea.  Pt has a colostomy.

## 2020-10-12 NOTE — ED Notes (Signed)
This RN gave report to [facility] at this time.

## 2020-10-13 DIAGNOSIS — I10 Essential (primary) hypertension: Secondary | ICD-10-CM | POA: Diagnosis present

## 2020-10-13 DIAGNOSIS — Z6841 Body Mass Index (BMI) 40.0 and over, adult: Secondary | ICD-10-CM | POA: Diagnosis not present

## 2020-10-13 DIAGNOSIS — K5669 Other partial intestinal obstruction: Secondary | ICD-10-CM | POA: Diagnosis present

## 2020-10-13 DIAGNOSIS — E7800 Pure hypercholesterolemia, unspecified: Secondary | ICD-10-CM | POA: Diagnosis present

## 2020-10-13 DIAGNOSIS — Z808 Family history of malignant neoplasm of other organs or systems: Secondary | ICD-10-CM | POA: Diagnosis not present

## 2020-10-13 DIAGNOSIS — K219 Gastro-esophageal reflux disease without esophagitis: Secondary | ICD-10-CM | POA: Diagnosis present

## 2020-10-13 DIAGNOSIS — M4646 Discitis, unspecified, lumbar region: Secondary | ICD-10-CM | POA: Diagnosis present

## 2020-10-13 DIAGNOSIS — Z90710 Acquired absence of both cervix and uterus: Secondary | ICD-10-CM | POA: Diagnosis not present

## 2020-10-13 DIAGNOSIS — Z85048 Personal history of other malignant neoplasm of rectum, rectosigmoid junction, and anus: Secondary | ICD-10-CM | POA: Diagnosis not present

## 2020-10-13 DIAGNOSIS — Z8719 Personal history of other diseases of the digestive system: Secondary | ICD-10-CM | POA: Diagnosis present

## 2020-10-13 DIAGNOSIS — E785 Hyperlipidemia, unspecified: Secondary | ICD-10-CM | POA: Diagnosis present

## 2020-10-13 DIAGNOSIS — Z20822 Contact with and (suspected) exposure to covid-19: Secondary | ICD-10-CM | POA: Diagnosis present

## 2020-10-13 DIAGNOSIS — E669 Obesity, unspecified: Secondary | ICD-10-CM | POA: Diagnosis present

## 2020-10-13 DIAGNOSIS — Z90721 Acquired absence of ovaries, unilateral: Secondary | ICD-10-CM | POA: Diagnosis not present

## 2020-10-13 DIAGNOSIS — Z923 Personal history of irradiation: Secondary | ICD-10-CM | POA: Diagnosis not present

## 2020-10-13 DIAGNOSIS — K56609 Unspecified intestinal obstruction, unspecified as to partial versus complete obstruction: Secondary | ICD-10-CM | POA: Diagnosis present

## 2020-10-13 DIAGNOSIS — Z933 Colostomy status: Secondary | ICD-10-CM | POA: Diagnosis not present

## 2020-10-13 DIAGNOSIS — Z79899 Other long term (current) drug therapy: Secondary | ICD-10-CM | POA: Diagnosis not present

## 2020-10-13 DIAGNOSIS — Z9221 Personal history of antineoplastic chemotherapy: Secondary | ICD-10-CM | POA: Diagnosis not present

## 2020-10-13 DIAGNOSIS — E876 Hypokalemia: Secondary | ICD-10-CM | POA: Diagnosis present

## 2020-10-13 DIAGNOSIS — Z803 Family history of malignant neoplasm of breast: Secondary | ICD-10-CM | POA: Diagnosis not present

## 2020-10-13 DIAGNOSIS — Z87891 Personal history of nicotine dependence: Secondary | ICD-10-CM | POA: Diagnosis not present

## 2020-10-13 DIAGNOSIS — G894 Chronic pain syndrome: Secondary | ICD-10-CM | POA: Diagnosis present

## 2020-10-13 DIAGNOSIS — Z9049 Acquired absence of other specified parts of digestive tract: Secondary | ICD-10-CM | POA: Diagnosis not present

## 2020-10-13 DIAGNOSIS — Z800 Family history of malignant neoplasm of digestive organs: Secondary | ICD-10-CM | POA: Diagnosis not present

## 2020-10-13 LAB — BASIC METABOLIC PANEL
Anion gap: 11 (ref 5–15)
BUN: 13 mg/dL (ref 6–20)
CO2: 20 mmol/L — ABNORMAL LOW (ref 22–32)
Calcium: 9.3 mg/dL (ref 8.9–10.3)
Chloride: 105 mmol/L (ref 98–111)
Creatinine, Ser: 0.78 mg/dL (ref 0.44–1.00)
GFR, Estimated: >60 mL/min (ref >60)
Glucose, Bld: 126 mg/dL — ABNORMAL HIGH (ref 70–99)
Potassium: 3.5 mmol/L (ref 3.5–5.1)
Sodium: 136 mmol/L (ref 135–145)

## 2020-10-13 LAB — URINALYSIS, ROUTINE W REFLEX MICROSCOPIC
Bacteria, UA: NONE SEEN
Bilirubin Urine: NEGATIVE
Glucose, UA: NEGATIVE mg/dL
Ketones, ur: 5 mg/dL — AB
Nitrite: NEGATIVE
Protein, ur: 30 mg/dL — AB
RBC / HPF: >50 RBC/hpf — ABNORMAL HIGH (ref 0–5)
Specific Gravity, Urine: 1.041 — ABNORMAL HIGH (ref 1.005–1.030)
pH: 5 (ref 5.0–8.0)

## 2020-10-13 LAB — CBC WITH DIFFERENTIAL/PLATELET
Abs Immature Granulocytes: 0.04 10*3/uL (ref 0.00–0.07)
Basophils Absolute: 0 10*3/uL (ref 0.0–0.1)
Basophils Relative: 0 %
Eosinophils Absolute: 0 10*3/uL (ref 0.0–0.5)
Eosinophils Relative: 0 %
HCT: 42.2 % (ref 36.0–46.0)
Hemoglobin: 13.6 g/dL (ref 12.0–15.0)
Immature Granulocytes: 0 %
Lymphocytes Relative: 15 %
Lymphs Abs: 1.4 10*3/uL (ref 0.7–4.0)
MCH: 27.8 pg (ref 26.0–34.0)
MCHC: 32.2 g/dL (ref 30.0–36.0)
MCV: 86.3 fL (ref 80.0–100.0)
Monocytes Absolute: 0.8 10*3/uL (ref 0.1–1.0)
Monocytes Relative: 9 %
Neutro Abs: 6.7 10*3/uL (ref 1.7–7.7)
Neutrophils Relative %: 76 %
Platelets: 299 10*3/uL (ref 150–400)
RBC: 4.89 MIL/uL (ref 3.87–5.11)
RDW: 13.2 % (ref 11.5–15.5)
WBC: 8.9 10*3/uL (ref 4.0–10.5)
nRBC: 0 % (ref 0.0–0.2)

## 2020-10-13 LAB — PHOSPHORUS: Phosphorus: 3.8 mg/dL (ref 2.5–4.6)

## 2020-10-13 LAB — MAGNESIUM: Magnesium: 2.1 mg/dL (ref 1.7–2.4)

## 2020-10-13 MED ORDER — PANTOPRAZOLE SODIUM 40 MG IV SOLR
40.0000 mg | Freq: Every day | INTRAVENOUS | Status: DC
  Administered 2020-10-13 – 2020-10-16 (×5): 40 mg via INTRAVENOUS
  Filled 2020-10-13 (×5): qty 40

## 2020-10-13 MED ORDER — POTASSIUM PHOSPHATES 15 MMOLE/5ML IV SOLN
20.0000 mmol | Freq: Once | INTRAVENOUS | Status: AC
  Administered 2020-10-13: 20 mmol via INTRAVENOUS
  Filled 2020-10-13: qty 6.67

## 2020-10-13 MED ORDER — ENOXAPARIN SODIUM 60 MG/0.6ML ~~LOC~~ SOLN
60.0000 mg | SUBCUTANEOUS | Status: DC
  Administered 2020-10-13 – 2020-10-17 (×5): 60 mg via SUBCUTANEOUS
  Filled 2020-10-13 (×5): qty 0.6

## 2020-10-13 MED ORDER — ONDANSETRON HCL 4 MG/2ML IJ SOLN
4.0000 mg | Freq: Four times a day (QID) | INTRAMUSCULAR | Status: DC | PRN
  Administered 2020-10-13 – 2020-10-15 (×4): 4 mg via INTRAVENOUS
  Filled 2020-10-13 (×4): qty 2

## 2020-10-13 MED ORDER — POTASSIUM CHLORIDE 10 MEQ/100ML IV SOLN
10.0000 meq | INTRAVENOUS | Status: AC
  Administered 2020-10-13 (×2): 10 meq via INTRAVENOUS
  Filled 2020-10-13 (×2): qty 100

## 2020-10-13 MED ORDER — SODIUM CHLORIDE 0.9 % IV SOLN
12.5000 mg | Freq: Four times a day (QID) | INTRAVENOUS | Status: DC | PRN
  Administered 2020-10-13 – 2020-10-15 (×5): 12.5 mg via INTRAVENOUS
  Filled 2020-10-13 (×7): qty 0.5

## 2020-10-13 MED ORDER — FENTANYL CITRATE (PF) 100 MCG/2ML IJ SOLN: 50.0000 ug | INTRAMUSCULAR | Status: DC | PRN

## 2020-10-13 MED ORDER — MORPHINE SULFATE (PF) 2 MG/ML IV SOLN
2.0000 mg | INTRAVENOUS | Status: DC | PRN
  Administered 2020-10-13 (×2): 2 mg via INTRAVENOUS
  Filled 2020-10-13 (×3): qty 1

## 2020-10-13 MED ORDER — DIPHENHYDRAMINE HCL 50 MG/ML IJ SOLN
25.0000 mg | Freq: Once | INTRAMUSCULAR | Status: AC | PRN
  Administered 2020-10-13: 25 mg via INTRAVENOUS
  Filled 2020-10-13: qty 1

## 2020-10-13 MED ORDER — METOPROLOL TARTRATE 5 MG/5ML IV SOLN
5.0000 mg | Freq: Four times a day (QID) | INTRAVENOUS | Status: DC
  Administered 2020-10-13 – 2020-10-17 (×18): 5 mg via INTRAVENOUS
  Filled 2020-10-13 (×19): qty 5

## 2020-10-13 MED ORDER — POTASSIUM CHLORIDE IN NACL 20-0.9 MEQ/L-% IV SOLN
INTRAVENOUS | Status: AC
  Filled 2020-10-13 (×9): qty 1000

## 2020-10-13 MED ORDER — DIPHENHYDRAMINE HCL 50 MG/ML IJ SOLN: 12.5000 mg | Freq: Every evening | INTRAMUSCULAR | Status: DC | PRN

## 2020-10-13 NOTE — Progress Notes (Signed)
The patient is requesting IV Benadryl for sleep. The PCP was notified. Awaiting new orders.

## 2020-10-13 NOTE — Progress Notes (Signed)
[facility] PROGRESS NOTE   Lindsay Little SJG:[id number] DOB: 09/10/1965 DOA: 10/12/2020  PCP: [name], MD  Brief History/Interval Summary: 55 y.o. female with medical history significant for rectal cancer s/p resection complicated by rectovaginal fistula and intra-abdominal abscesses, colostomy in place, lumbar discitis with vertebral osteomyelitis, hypertension, hyperlipidemia, chronic pain syndrome who presented to the ED for evaluation of abdominal pain and vomiting.  Patient found to have evidence for small bowel obstruction on CT scan.  Was hospitalized for further management.  General surgery was consulted.  Patient declined NG tube.  Consultants: General surgery  Procedures: None  Antibiotics: Anti-infectives (From admission, onward)   None      Subjective/Interval History: Patient mentions that she continues to have nausea but has not had any vomiting.  Has been passing some gas through her ostomy.  Has noticed a little bit of stool as well.  Abdominal pain is well controlled.  Currently 3 out of 10 in intensity.    Assessment/Plan:  Partial small bowel obstruction in the setting of history of rectal cancer status post resection and presence of colostomy Patient last surgery was more than 2 years ago.  She mentions that she has never been hospitalized for similar problem in the past.  She has been very hesitant to have an NG tube placed.  General surgery has been consulted.  Defer management to them.  Essential hypertension Oral medicines on hold due to the small bowel obstruction.  She has been placed on IV metoprolol.  Elevated blood pressure could also be due to pain issues.  Continue to monitor for now.  Hypokalemia and hypophosphatemia Potassium level has improved.  Will give additional supplementation.  Phosphorus level has also improved.  History of lumbar discitis with vertebral osteomyelitis No longer on any antibiotics.  Followed by Dr. Lucianne Lei  dam with ID.  Chronic pain syndrome Takes Norco at home which is currently on hold.  Hyperlipidemia Statin is on hold.  Left adnexal fluid density Enough multiloculated fluid density in the left lower abdomen/adnexa measuring about 5.1 cm was noted on CT scan.  No surrounding inflammation noted.  Not noted on CT scan done in 2020.  This will need outpatient evaluation.  Obesity, class III Estimated body mass index is 41.84 kg/m as calculated from the following:   Height as of this encounter: 5' 11"  (1.803 m).   Weight as of this encounter: 136.1 kg.   DVT Prophylaxis: Lovenox Code Status: Full code Family Communication: Discussed with the patient.  No family at bedside Disposition Plan: Hopefully return home when improved.  Mobilize.  Status is: Observation  The patient will require care spanning > 2 midnights and should be moved to inpatient because: Ongoing active pain requiring inpatient pain management, IV treatments appropriate due to intensity of illness or inability to take PO and Inpatient level of care appropriate due to severity of illness  Dispo: The patient is from: Home              Anticipated d/c is to: Home              Patient currently is not medically stable to d/c.   Difficult to place patient No      Medications:  Scheduled: . enoxaparin (LOVENOX) injection  60 mg Subcutaneous Q24H  . metoprolol tartrate  5 mg Intravenous Q6H  . pantoprazole (PROTONIX) IV  40 mg Intravenous QHS   Continuous: . 0.9 % NaCl with KCl 20 mEq / L 125  mL/hr at 10/13/20 0615  . promethazine (PHENERGAN) injection 12.5 mg (10/13/20 0208)   JSH:FWYOVZCH injection, ondansetron (ZOFRAN) IV, promethazine (PHENERGAN) injection   Objective:  Vital Signs  Vitals:   10/12/20 2306 10/13/20 0047 10/13/20 0302 10/13/20 0709  BP: (!) 177/95 (!) 157/95 (!) 149/89 (!) 168/93  Pulse: 77 75 97 67  Resp: 20   20  Temp: 98.7 F (37.1 C)  98.2 F (36.8 C) 98.5 F (36.9 C)   TempSrc: Oral  Oral Oral  SpO2: 100%  93% 96%  Weight:      Height:        Intake/Output Summary (Last 24 hours) at 10/13/2020 0937 Last data filed at 10/13/2020 0615 Gross per 24 hour  Intake 2249.1 ml  Output --  Net 2249.1 ml   Filed Weights   10/12/20 1609  Weight: 136.1 kg    General appearance: Awake alert.  In no distress Resp: Clear to auscultation bilaterally.  Normal effort Cardio: S1-S2 is normal regular.  No S3-S4.  No rubs murmurs or bruit GI: Abdomen is soft.  Mildly tender diffusely without any rebound rigidity or guarding.  Ostomy is noted on the left.  No obvious masses or organomegaly noted. Extremities: No edema.  Full range of motion of lower extremities. Neurologic: Alert and oriented x3.  No focal neurological deficits.    Lab Results:  Data Reviewed: I have personally reviewed following labs and imaging studies  CBC: Recent Labs  Lab 10/12/20 1629 10/13/20 0350  WBC 8.0 8.9  NEUTROABS 6.2 6.7  HGB 14.3 13.6  HCT 43.9 42.2  MCV 84.6 86.3  PLT 291 885    Basic Metabolic Panel: Recent Labs  Lab 10/12/20 1629 10/13/20 0350  NA 137 136  K 3.3* 3.5  CL 98 105  CO2 24 20*  GLUCOSE 137* 126*  BUN 15 13  CREATININE 0.87 0.78  CALCIUM 10.3 9.3  MG 1.8 2.1  PHOS 2.1* 3.8    GFR: Estimated Creatinine Clearance: 124.4 mL/min (by C-G formula based on SCr of 0.78 mg/dL).  Liver Function Tests: Recent Labs  Lab 10/12/20 1629  AST 23  ALT 27  ALKPHOS 73  BILITOT 1.0  PROT 8.1  ALBUMIN 4.6    Recent Labs  Lab 10/12/20 1629  LIPASE 28     Recent Results (from the past 240 hour(s))  Resp Panel by RT-PCR (Flu A&B, Covid) Nasopharyngeal Swab     Status: None   Collection Time: 10/12/20 10:40 PM   Specimen: Nasopharyngeal Swab; Nasopharyngeal(NP) swabs in vial transport medium  Result Value Ref Range Status   SARS Coronavirus 2 by RT PCR NEGATIVE NEGATIVE Final    Comment: (NOTE) SARS-CoV-2 target nucleic acids are NOT  DETECTED.  The SARS-CoV-2 RNA is generally detectable in upper respiratory specimens during the acute phase of infection. The lowest concentration of SARS-CoV-2 viral copies this assay can detect is 138 copies/mL. A negative result does not preclude SARS-Cov-2 infection and should not be used as the sole basis for treatment or other patient management decisions. A negative result may occur with  improper specimen collection/handling, submission of specimen other than nasopharyngeal swab, presence of viral mutation(s) within the areas targeted by this assay, and inadequate number of viral copies(<138 copies/mL). A negative result must be combined with clinical observations, patient history, and epidemiological information. The expected result is Negative.  Fact Sheet for Patients:  EntrepreneurPulse.com.au  Fact Sheet for Healthcare Providers:  IncredibleEmployment.be  This test is no t yet approved or  cleared by the [place] and  has been authorized for detection and/or diagnosis of SARS-CoV-2 by FDA under an Emergency Use Authorization (EUA). This EUA will remain  in effect (meaning this test can be used) for the duration of the COVID-19 declaration under Section 564(b)(1) of the Act, 21 U.S.C.section 360bbb-3(b)(1), unless the authorization is terminated  or revoked sooner.       Influenza A by PCR NEGATIVE NEGATIVE Final   Influenza B by PCR NEGATIVE NEGATIVE Final    Comment: (NOTE) The Xpert Xpress SARS-CoV-2/FLU/RSV plus assay is intended as an aid in the diagnosis of influenza from Nasopharyngeal swab specimens and should not be used as a sole basis for treatment. Nasal washings and aspirates are unacceptable for Xpert Xpress SARS-CoV-2/FLU/RSV testing.  Fact Sheet for Patients: EntrepreneurPulse.com.au  Fact Sheet for Healthcare Providers: IncredibleEmployment.be  This test is not yet  approved or cleared by the [place] FDA and has been authorized for detection and/or diagnosis of SARS-CoV-2 by FDA under an Emergency Use Authorization (EUA). This EUA will remain in effect (meaning this test can be used) for the duration of the COVID-19 declaration under Section 564(b)(1) of the Act, 21 U.S.C. section 360bbb-3(b)(1), unless the authorization is terminated or revoked.  Performed at [facility]       Radiology Studies: CT ABDOMEN PELVIS W CONTRAST  Result Date: 10/12/2020 CLINICAL DATA:  Nausea and vomiting. EXAM: CT ABDOMEN AND PELVIS WITH CONTRAST TECHNIQUE: Multidetector CT imaging of the abdomen and pelvis was performed using the standard protocol following bolus administration of intravenous contrast. CONTRAST:  148m OMNIPAQUE IOHEXOL 300 MG/ML  SOLN IV contrast extravasation of 86 cc Omnipaque in the right arm. Examination is essentially a noncontrast exam, with only minimal intravascular contrast. COMPARISON:  CT 06/18/2019 FINDINGS: Lower chest: No acute airspace disease or pleural effusion. Hepatobiliary: Diffusely decreased hepatic density typical of steatosis. No focal hepatic abnormality. There is minimal layering hyperdensity in the gallbladder that may represent sludge or stones. No pericholecystic fat stranding. No biliary dilatation. Pancreas: Parenchymal atrophy. No ductal dilatation or inflammation. Spleen: Tiny central splenic hypodensities unchanged, nonspecific. Normal in size. Adrenals/Urinary Tract: Normal adrenal glands. No hydronephrosis or perinephric edema. No renal calculi. Urinary bladder is partially distended. Stomach/Bowel: Stomach distended with ingested contents/fluid. Small bowel are dilated and fluid-filled. There is fecalization of dilated small bowel contents in the pelvis. There is transition from dilated to nondilated in the right lower quadrant, though discrete transition point is not seen. No pneumatosis. Enteric  sutures noted in the distal ileum. Formed stool in the ascending and transverse colon. Left mid abdominal colostomy. No significant peristomal hernia. Prior abdominal perineal resection of the distal colon. Stable presacral soft tissue density from prior exam. Vascular/Lymphatic: Abdominal aorta is normal in caliber. No aortic aneurysm. Retroperitoneal lymph nodes are not enlarged by size criteria. There is no pelvic adenopathy. Reproductive: Hysterectomy. Other: Similar appearance of presacral soft tissue density likely related to treatment. Within the left lower quadrant/adnexa is a elongated multiloculated fluid density structure spanning 5.1 cm in cranial caudal dimension. No surrounding inflammation. No soft tissue components. No free intra-abdominal air. No significant mesenteric edema. Musculoskeletal: Postsurgical change at L4-L5. Lucency about the pedicle screws, more so on the right. The L4 pedicle screw extend to the L3-L4 disc space. Progressive sclerosis at L3-L4 from prior exam, previous osteomyelitis on spine MRI. No acute osseous abnormalities are seen. IMPRESSION: 1. Findings consistent with small bowel obstruction with transition in the right lower quadrant,  though discrete transition point is not seen. 2. Post abdominal perineal resection with left mid abdominal colostomy. Stable post treatment related presacral soft tissue density. 3. Multi loculated fluid density structure in the left lower quadrant/adnexa measuring 5.1 cm without surrounding inflammation. This is favored to be sequela of prior surgery possible peritoneal inclusion cyst or lymphocele. Attention at follow-up recommended. 4. Hepatic steatosis. 5. Sequela of L3-L4 discitis osteomyelitis. 6. Minimal layering hyperdensity in the gallbladder may represent sludge or stones. No pericholecystic fat stranding. 7. IV contrast extravasation. Aortic Atherosclerosis (ICD10-I70.0). Electronically Signed   By: Keith Rake M.D.   On:  10/12/2020 20:24       LOS: 0 days   [facility] Pager on [email or web address]  10/13/2020, 9:37 AM

## 2020-10-13 NOTE — Consult Note (Signed)
Surgical Evaluation Requesting provider: Dr. Maryland Pink  Chief Complaint: Small bowel obstruction  HPI: This is a 55 year old woman with a complex abdominal surgical history and APR anatomy who developed nausea, vomiting, and cramping abdominal pain about 5 days ago.  This has been associated with poor oral intake and minimal ostomy output.  No known fevers.  She thought initially that this was perhaps a viral gastroenteritis, but when symptoms did not resolve by the fourth day of symptoms she presented to the emergency department.  CT scan report below, consistent with small bowel obstruction.  She states that she had been feeling better overnight with less nausea until she got up to go to the bathroom this morning and is again feeling nauseated.  Reports gas and a small amount of stool from her stoma this morning.  She is a CMA, she works with [employer] in [place]  . Caine-1 [Lidocaine] Swelling and Rash    Eyes swell shut; includes all caine drugs except marcaine. EMLA cream OK though (?!)  . Sulfa Antibiotics Nausea And Vomiting and Rash  . Adhesive [Tape] Other (See Comments)    Blisters - can use paper tape  . Doxycycline Nausea Only  . Flagyl [Metronidazole] Nausea Only  . Iron Nausea Only  . Oxycodone Other (See Comments)    NIGHTMARES. (tolerates hydrocodone or tramadol better)  . Penicillins Rash    Has patient had a PCN reaction causing immediate rash, facial/tongue/throat swelling, SOB or lightheadedness with hypotension: no Has patient had a PCN reaction causing severe rash involving mucus membranes or skin necrosis: no Has patient had a PCN reaction that required hospitalization no Has patient had a PCN reaction occurring within the last 10 years: no If all of the above answers are "NO", then may proceed with Cephalosporin use.     Past Medical History:  Diagnosis Date  . Anxiety   . Chemotherapy-induced neuropathy (HCC)     toes and fingers numbness and tingling  . Colorectal delayed anastomotic leak s/p resection & colostomy 07/12/2016 11/17/2015  . Colostomy in place [facility]) 07/12/2016   due to anastomosis breakdown w/ colovaginal fistula  . Colovaginal fistula s/p omentopexy repair 07/12/2016 07/10/2016  . Complication of anesthesia   . Depression   . Family history of adverse reaction to anesthesia    mother-- ponv  . Family history of breast cancer   . Family history of pancreatic cancer   . Family history of skin cancer   . Gastroenteritis 12/20/2015  . GERD (gastroesophageal reflux disease)   . Hardware complicating wound infection ([place]) 08/03/2019  . Hiatal hernia   . History of cancer chemotherapy 02-20-2015 to 03-30-2015  . History of cardiac murmur as a child   . History of chemotherapy   . History of chronic gastritis   . History of hypertension    no issues since multiple abdminal sx's and chemo--- no medication since 12/ 2016  . History of TMJ disorder   . Hypercholesteremia   . Hypertension   . IBS (irritable bowel syndrome)    dx age 42  . Intermittent abdominal pain    post-op multiple abdominal sx's   . Microcytic anemia   . Mild sleep apnea    per pt study 2014  very mild osa , no cpap recommended, recommeded wt loss and sleep routine  . OA (osteoarthritis)    left knee /  left shoulder  . Obesity   . PONV (postoperative nausea and vomiting)  severe" needs Scopolamine PATCH"   . Portacath in place    right chest  . Rectal adenocarcinoma ([place]) oncologis-  dr Burr Medico-- after radiation/ chemo (ypT1, N0) --  no recurrence per last note 03/ 2018   dx 01-13-2015-- Stage IIIC (T3, N2, M0) post concurrent radiation and chemotherapy 02-20-2015 to 03-30-2015 /  05-25-2015 s/p  LAR w/ RSO (post-op complicated by late abscess and contained anatomotic leak w/ help percutaneous drainage and antibiotics)   . Rotator cuff tear, left   . S/P radiation therapy 02/20/15-03/30/15   colon/rectal  .  Vitamin D deficiency   . Wears glasses   . Wears glasses     Past Surgical History:  Procedure Laterality Date  . ABDOMINAL HYSTERECTOMY  1996   uterus and cervix  . BACK SURGERY  02/12/2018   lumbar surgery  . COLON RESECTION N/A 07/12/2016   Procedure: LAPAROSCOPIC LYSIS OF ADHESIONS, OMENTOPEXY, HAND ASSISTED RESECTION OF  COLON, END TO END ANASTOMOSIS, COLOSTOMY;  Surgeon: Michael Boston, MD;  Location: [facility];  Service: General;  Laterality: N/A;  . COMBINED HYSTEROSCOPY DIAGNOSTIC / D&C  x2 1990's  . DIAGNOSTIC LAPAROSCOPY  age 76 and age 49  . EUS N/A 01/18/2015   Procedure: LOWER ENDOSCOPIC ULTRASOUND (EUS);  Surgeon: Arta Silence, MD;  Location: [facility];  Service: Endoscopy;  Laterality: N/A;  . EXCISION OF SKIN TAG  11/17/2015   Procedure: EXCISION OF SKIN TAG;  Surgeon: Michael Boston, MD;  Location: [facility];  Service: General;;  . ILEO LOOP COLOSTOMY CLOSURE N/A 11/17/2015   Procedure: LAPAROSCOPIC DIVERTING LOOP ILEOSTOMY  DRAINAGE OF PELVIC ABSCESS;  Surgeon: Michael Boston, MD;  [facility]: [facility];  Service: General;  Laterality: N/A;  . ILEOSTOMY CLOSURE N/A 05/31/2016   Procedure: TAKEDOWN LOOP ILEOSTOMY;  Surgeon: Michael Boston, MD;  [facility]: [facility];  Service: General;  Laterality: N/A;  . IMPACTION REMOVAL  11/17/2015   Procedure: DISIMPACTION REMOVAL;  Surgeon: Michael Boston, MD;  [facility];  Service: General;;  . INSERTION OF MESH N/A 07/23/2018   Procedure: INSERTION OF MESH X2;  Surgeon: Michael Boston, MD;  [facility];  Service: General;  Laterality: N/A;  . IR LUMBAR [place] W/IMG GUIDE  07/01/2019  . KNEE ARTHROSCOPY Left 1990's  . KNEE ARTHROSCOPY W/ MENISCECTOMY Left 09/14/2009   and chondroplasty debridement  . LAPAROSCOPIC LYSIS OF ADHESIONS  11/17/2015   Procedure: LAPAROSCOPIC LYSIS OF ADHESIONS;  Surgeon: Michael Boston, MD;  Location: [facility];  Service: General;;  . LUMBAR WOUND DEBRIDEMENT N/A 04/24/2018   Procedure: wound exploration, irrigation  and debridement;  Surgeon: Eustace Moore, MD;  Location: [place];  Service: Neurosurgery;  Laterality: N/A;  wound exploration, irrigation and debridement  . LYSIS OF ADHESION N/A 07/23/2018   Procedure: LYSIS OF ADHESIONS;  Surgeon: Michael Boston, MD;  [facility];  Service: General;  Laterality: N/A;  . PORT-A-CATH REMOVAL N/A 11/21/2016   Procedure: REMOVAL PORT-A-CATH;  Surgeon: Michael Boston, MD;  Location: [facility];  Service: General;  Laterality: N/A;  . PORTACATH PLACEMENT N/A 05/25/2015   Procedure: INSERTION PORT-A-CATH;  Surgeon: Michael Boston, MD;  [facility];  Service: General;  Laterality: N/A;-remains inplace Right chest.  . ROTATOR CUFF REPAIR Right 2006  . TONSILLECTOMY  age 37  . VENTRAL HERNIA REPAIR N/A 07/23/2018   Procedure: LAPAROSCOPIC VENTRAL WALL HERNIA REPAIR;  Surgeon: Michael Boston, MD;  [facility];  Service: General;  Laterality: N/A;  . XI ROBOTIC ASSISTED LOWER ANTERIOR  RESECTION N/A 05/25/2015   Procedure: XI ROBOTIC ASSISTED LOWER ANTERIOR RESECTION, , RIGID PROCTOSCOPY, RIGHT OOPHORECTOMY;  Surgeon: Michael Boston, MD;  [facility];  Service: General;  Laterality: N/A;    Family History  Problem Relation Age of Onset  . Coronary artery disease Mother 81  . Hypertension Mother   . Hyperlipidemia Mother   . Diabetes Mellitus I Mother   . Coronary artery disease Father   . Hyperlipidemia Father   . Hypertension Father   . Cancer Sister        skin - non melanoma  . Hyperlipidemia Brother   . Cancer Maternal Uncle 106       pancreatic with mets to colon and prostate  . Cirrhosis Maternal Uncle   . Hypertension Maternal Grandmother   . Diabetes Mellitus I Maternal Grandmother   . Hyperlipidemia Maternal Grandmother   . CVA Maternal Grandmother   . Hypertension Maternal Grandfather   . Coronary artery disease Maternal Grandfather 65  . Hyperlipidemia Maternal Grandfather   . Coronary artery disease Paternal Grandmother    . Hypertension Paternal Grandmother   . Hyperlipidemia Paternal Grandmother   . Diabetes Mellitus I Paternal Grandmother   . Hypertension Paternal Grandfather   . Hyperlipidemia Paternal Grandfather   . Coronary artery disease Paternal Grandfather     Social History   Socioeconomic History  . Marital status: Married    Spouse name: Not on file  . Number of children: Not on file  . Years of education: Not on file  . Highest education level: Not on file  Occupational History  . Not on file  Tobacco Use  . Smoking status: Former Smoker    Packs/day: 0.25    Years: 7.00    Pack years: 1.75    Types: Cigarettes    Quit date: 07/16/1991    Years since quitting: 29.2  . Smokeless tobacco: Never Used  Vaping Use  . Vaping Use: Never used  Substance and Sexual Activity  . Alcohol use: No  . Drug use: Never  . Sexual activity: Yes    Birth control/protection: Surgical  Other Topics Concern  . Not on file  Social History Narrative   Tobacco Use: Cigarettes - Former Smoker   Alcohol: Yes, very rare, liquor.    No recreational drug use   Occupation: Head CMA @ [employer]   Marital Status: Married    Husband: Roselyn Reef Disabled   Children: 2 adopted kids [place]   Religion: [employer] in [place]               Social Determinants of Health   Financial Resource Strain: Not on file  Food Insecurity: Not on file  Transportation Needs: Not on file  Physical Activity: Not on file  Stress: Not on file  Social Connections: Not on file    Current Facility-Administered Medications on File Prior to Encounter  Medication Dose Route Frequency Provider Last Rate Last Admin  . 0.9 %  sodium chloride infusion   Intravenous Once Truitt Merle, MD       Current Outpatient Medications on File Prior to Encounter  Medication Sig Dispense Refill  . bisoprolol (ZEBETA) 10 MG tablet Take 10 mg by mouth daily.    . celecoxib (CELEBREX) 200 MG capsule TAKE ONE CAPSULE BY MOUTH  TWICE DAILY (Patient taking differently: Take 200 mg by mouth 2 (two) times daily.) 60 capsule 0  . Cholecalciferol (VITAMIN D3) 5000 units CAPS Take 5,000 Units by mouth  at bedtime.     . Coenzyme Q10 (COQ10) 200 MG CAPS Take 200 mg by mouth at bedtime.    . DULoxetine (CYMBALTA) 60 MG capsule Take 120 mg by mouth at bedtime.     Marland Kitchen esomeprazole (NEXIUM) 40 MG capsule Take 1 capsule (40 mg total) by mouth 2 (two) times daily before a meal. 60 capsule 5  . fentaNYL (DURAGESIC) 100 MCG/HR 1 patch every 3 (three) days.    . furosemide (LASIX) 20 MG tablet Take 20 mg by mouth daily as needed for edema.     . gabapentin (NEURONTIN) 800 MG tablet Take 800 mg by mouth 3 (three) times daily.    Marland Kitchen HYDROcodone-acetaminophen (NORCO) 10-325 MG tablet Take 1 tablet by mouth every 8 (eight) hours as needed for severe pain. 60 tablet 0  . levETIRAcetam (KEPPRA) 500 MG tablet Take 500 mg by mouth 2 (two) times daily.    . Melatonin 3 MG TABS Take 3-6 mg by mouth at bedtime.     . Multiple Vitamin (MULTIVITAMIN WITH MINERALS) TABS tablet Take 2 tablets by mouth at bedtime.     . polyethylene glycol (MIRALAX / GLYCOLAX) packet Take 17 g by mouth daily as needed for moderate constipation.    . promethazine (PHENERGAN) 25 MG tablet TAKE ONE TABLET BY MOUTH EVERY 6 HOURS AS NEEDED FOR NAUSEA OR VOMITING (Patient taking differently: Take 25 mg by mouth every 6 (six) hours as needed for nausea or vomiting.) 60 tablet 0  . rosuvastatin (CRESTOR) 5 MG tablet Take 5 mg by mouth at bedtime.    . triamcinolone lotion (KENALOG) 0.1 % Apply 1 application topically as needed (dry skin).    . cefdinir (OMNICEF) 300 MG capsule Take 1 capsule (300 mg total) by mouth 2 (two) times daily. (Patient not taking: Reported on 10/12/2020) 60 capsule 11  . doxycycline (VIBRA-TABS) 100 MG tablet Take 1 tablet (100 mg total) by mouth 2 (two) times daily. (Patient not taking: Reported on 10/12/2020) 60 tablet 11    Review of Systems: a  complete, 10pt review of systems was completed with pertinent positives and negatives as documented in the HPI  Physical Exam: Vitals:   10/13/20 0302 10/13/20 0709  BP: (!) 149/89 (!) 168/93  Pulse: 97 67  Resp:  20  Temp: 98.2 F (36.8 C) 98.5 F (36.9 C)  SpO2: 93% 96%   Gen: A&Ox3, no distress  Eyes: lids and conjunctivae normal, no icterus. Pupils equally round and reactive to light.  Neck: supple without mass or thyromegaly Chest: respiratory effort is normal.  Symmetrical air entry Cardiovascular: RRR,  no pedal edema Gastrointestinal: Obese, soft, mildly tender in the upper abdomen.  Stoma with gas and small amount of stool in the bag. Muscoloskeletal: no clubbing or cyanosis of the fingers.  Strength is symmetrical throughout.  Range of motion of bilateral upper and lower extremities normal without pain, crepitation or contracture. Neuro: cranial nerves grossly intact.  Sensation intact to light touch diffusely. Psych: appropriate mood and affect, normal insight/judgment intact  Skin: warm and dry   CBC Latest Ref Rng & Units 10/13/2020 10/12/2020 08/25/2019  WBC 4.0 - 10.5 K/uL 8.9 8.0 5.9  Hemoglobin 12.0 - 15.0 g/dL 13.6 14.3 12.0  Hematocrit 36.0 - 46.0 % 42.2 43.9 38.2  Platelets 150 - 400 K/uL 299 291 227    CMP Latest Ref Rng & Units 10/13/2020 10/12/2020 08/25/2019  Glucose 70 - 99 mg/dL 126(H) 137(H) 108(H)  BUN 6 - 20 mg/dL  13 15 7   Creatinine 0.44 - 1.00 mg/dL 0.78 0.87 0.77  Sodium 135 - 145 mmol/L 136 137 146(H)  Potassium 3.5 - 5.1 mmol/L 3.5 3.3(L) 4.0  Chloride 98 - 111 mmol/L 105 98 104  CO2 22 - 32 mmol/L 20(L) 24 31  Calcium 8.9 - 10.3 mg/dL 9.3 10.3 9.8  Total Protein 6.5 - 8.1 g/dL - 8.1 7.0  Total Bilirubin 0.3 - 1.2 mg/dL - 1.0 0.3  Alkaline Phos 38 - 126 U/L - 73 86  AST 15 - 41 U/L - 23 20  ALT 0 - 44 U/L - 27 17    Lab Results  Component Value Date   INR 0.9 07/01/2019   INR 0.95 02/03/2018   INR 1.10 09/16/2015    Imaging: CT ABDOMEN  PELVIS W CONTRAST  Result Date: 10/12/2020 CLINICAL DATA:  Nausea and vomiting. EXAM: CT ABDOMEN AND PELVIS WITH CONTRAST TECHNIQUE: Multidetector CT imaging of the abdomen and pelvis was performed using the standard protocol following bolus administration of intravenous contrast. CONTRAST:  172m OMNIPAQUE IOHEXOL 300 MG/ML  SOLN IV contrast extravasation of 86 cc Omnipaque in the right arm. Examination is essentially a noncontrast exam, with only minimal intravascular contrast. COMPARISON:  CT 06/18/2019 FINDINGS: Lower chest: No acute airspace disease or pleural effusion. Hepatobiliary: Diffusely decreased hepatic density typical of steatosis. No focal hepatic abnormality. There is minimal layering hyperdensity in the gallbladder that may represent sludge or stones. No pericholecystic fat stranding. No biliary dilatation. Pancreas: Parenchymal atrophy. No ductal dilatation or inflammation. Spleen: Tiny central splenic hypodensities unchanged, nonspecific. Normal in size. Adrenals/Urinary Tract: Normal adrenal glands. No hydronephrosis or perinephric edema. No renal calculi. Urinary bladder is partially distended. Stomach/Bowel: Stomach distended with ingested contents/fluid. Small bowel are dilated and fluid-filled. There is fecalization of dilated small bowel contents in the pelvis. There is transition from dilated to nondilated in the right lower quadrant, though discrete transition point is not seen. No pneumatosis. Enteric sutures noted in the distal ileum. Formed stool in the ascending and transverse colon. Left mid abdominal colostomy. No significant peristomal hernia. Prior abdominal perineal resection of the distal colon. Stable presacral soft tissue density from prior exam. Vascular/Lymphatic: Abdominal aorta is normal in caliber. No aortic aneurysm. Retroperitoneal lymph nodes are not enlarged by size criteria. There is no pelvic adenopathy. Reproductive: Hysterectomy. Other: Similar appearance of  presacral soft tissue density likely related to treatment. Within the left lower quadrant/adnexa is a elongated multiloculated fluid density structure spanning 5.1 cm in cranial caudal dimension. No surrounding inflammation. No soft tissue components. No free intra-abdominal air. No significant mesenteric edema. Musculoskeletal: Postsurgical change at L4-L5. Lucency about the pedicle screws, more so on the right. The L4 pedicle screw extend to the L3-L4 disc space. Progressive sclerosis at L3-L4 from prior exam, previous osteomyelitis on spine MRI. No acute osseous abnormalities are seen. IMPRESSION: 1. Findings consistent with small bowel obstruction with transition in the right lower quadrant, though discrete transition point is not seen. 2. Post abdominal perineal resection with left mid abdominal colostomy. Stable post treatment related presacral soft tissue density. 3. Multi loculated fluid density structure in the left lower quadrant/adnexa measuring 5.1 cm without surrounding inflammation. This is favored to be sequela of prior surgery possible peritoneal inclusion cyst or lymphocele. Attention at follow-up recommended. 4. Hepatic steatosis. 5. Sequela of L3-L4 discitis osteomyelitis. 6. Minimal layering hyperdensity in the gallbladder may represent sludge or stones. No pericholecystic fat stranding. 7. IV contrast extravasation. Aortic Atherosclerosis (ICD10-I70.0). Electronically Signed  By: Keith Rake M.D.   On: 10/12/2020 20:24     A/P: Partial small bowel obstruction.  Patient declining NG tube at this time, I think that is fine as she is not actively vomiting and nausea seems to be actually improving.  Would keep n.p.o., fluid resuscitate.  Monitor further ostomy output.    Patient Active Problem List   Diagnosis Date Noted  . Hypophosphatemia 10/13/2020  . Small bowel obstruction ([place]) 10/12/2020  . Hardware complicating wound infection ([place]) 08/03/2019  . Discitis 07/15/2019  .  Chronic pain syndrome 07/24/2018  . Parastomal hernia s/p lap repair w mesh 07/23/2018 07/23/2018  . 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  . 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  . Seroma of musculoskeletal structure after non-musculoskeletal system procedure 04/24/2018  . S/P lumbar spinal fusion 02/12/2018  . Genetic testing 01/12/2018  . Family history of pancreatic cancer   . Family history of skin cancer   . Family history of breast cancer   . Colostomy in place [facility]) 07/12/2016  . Rectocutaneous fistula 07/10/2016  . Hydroureter on right 12/20/2015  . Abdominal pain 12/19/2015  . Hypokalemia 11/23/2015  . Pelvic abscess s/p drainage & omental pedicle flap 11/17/2015 06/23/2015  . Anemia of chronic disease 06/14/2015  . Hypertension   . IBS (irritable bowel syndrome)   . Depression   . Cavernous hemangioma of liver - segments 5 & 7 02/07/2015  . Osteoarthritis of left knee 02/07/2015  . Crohn's disease ([place]) 02/07/2015  . Rectal adenocarcinoma s/p LAR resection 05/25/2015 01/31/2015  . Pure hypercholesterolemia 02/21/2014  . Morbid obesity with body mass index of 40.0-49.9 ([place]) 02/21/2014  . Family history of ischemic heart disease 02/21/2014       Romana Juniper, MD [facility]  See AMION to contact appropriate on-call provider

## 2020-10-13 NOTE — Plan of Care (Signed)

## 2020-10-13 NOTE — H&P (Signed)
History and Physical    Lindsay Little VEH:[id number] DOB: February 12, 1966 DOA: 10/12/2020  PCP: [name], MD  Patient coming from: [place]  I have personally briefly reviewed patient's old medical records in [place]  Chief Complaint: Abdominal pain, vomiting  HPI: Lindsay Little is a 55 y.o. female with medical history significant for rectal cancer s/p resection complicated by rectovaginal fistula and intra-abdominal abscesses, colostomy in place, lumbar discitis with vertebral osteomyelitis, hypertension, hyperlipidemia, chronic pain syndrome who presented to the ED for evaluation of abdominal pain and vomiting.  Patient states she has been having 5 days of nausea, vomiting, and cramping abdominal pain.  She has had poor oral intake and over the last 2 days no ostomy output.  She has had some chills and diaphoresis but denies any fevers.  She has not had any chest pain, dyspnea, or dysuria.  She says initially her abdomen felt firm/tense which is now improved.  She came to the ED due to persistent symptoms.  [facility] ED Course:  Initial vitals showed BP 158/99, pulse 86, RR 16, temp 98.3 F, SPO2 100% on room air.  Labs showed sodium 137, potassium 3.3, bicarb 24, BUN 15, creatinine 0.87, serum glucose 137, LFTs within normal limits, lipase 28, magnesium 1.8, phosphorus 2.1, WBC 8.0, hemoglobin 14.3, platelets 291,000.  SARS-CoV-2 PCR panel negative.  Influenza A/B negative.  CT abdomen/pelvis with contrast showed small bowel obstruction with transition in the right lower quadrant.  Post abdominal perineal resection with left midabdominal colostomy changes noted.  Multiloculated fluid density structure in left lower quadrant/adnexa measuring 5.1 cm without surrounding inflammation noted and felt to be sequela of prior surgery.  Sequela of L3-L4 discitis osteomyelitis noted.  Patient was given IV fluids, fentanyl, antiemetics, magnesium, potassium.   General surgery were consulted and felt NG tube not currently required, recommended medical admission.  The hospitalist service was consulted to admit for further management.  Review of Systems: All systems reviewed and are negative except as documented in history of present illness above.   Past Medical History:  Diagnosis Date  . Anxiety   . Chemotherapy-induced neuropathy (HCC)    toes and fingers numbness and tingling  . Colorectal delayed anastomotic leak s/p resection & colostomy 07/12/2016 11/17/2015  . Colostomy in place [facility]) 07/12/2016   due to anastomosis breakdown w/ colovaginal fistula  . Colovaginal fistula s/p omentopexy repair 07/12/2016 07/10/2016  . Complication of anesthesia   . Depression   . Family history of adverse reaction to anesthesia    mother-- ponv  . Family history of breast cancer   . Family history of pancreatic cancer   . Family history of skin cancer   . Gastroenteritis 12/20/2015  . GERD (gastroesophageal reflux disease)   . Hardware complicating wound infection ([place]) 08/03/2019  . Hiatal hernia   . History of cancer chemotherapy 02-20-2015 to 03-30-2015  . History of cardiac murmur as a child   . History of chemotherapy   . History of chronic gastritis   . History of hypertension    no issues since multiple abdminal sx's and chemo--- no medication since 12/ 2016  . History of TMJ disorder   . Hypercholesteremia   . Hypertension   . IBS (irritable bowel syndrome)    dx age 89  . Intermittent abdominal pain    post-op multiple abdominal sx's   . Microcytic anemia   . Mild sleep apnea    per pt study 2014  very  mild osa , no cpap recommended, recommeded wt loss and sleep routine  . OA (osteoarthritis)    left knee /  left shoulder  . Obesity   . PONV (postoperative nausea and vomiting)    severe" needs Scopolamine PATCH"   . Portacath in place    right chest  . Rectal adenocarcinoma ([place]) oncologis-  dr Burr Medico-- after radiation/ chemo (ypT1,  N0) --  no recurrence per last note 03/ 2018   dx 01-13-2015-- Stage IIIC (T3, N2, M0) post concurrent radiation and chemotherapy 02-20-2015 to 03-30-2015 /  05-25-2015 s/p  LAR w/ RSO (post-op complicated by late abscess and contained anatomotic leak w/ help percutaneous drainage and antibiotics)   . Rotator cuff tear, left   . S/P radiation therapy 02/20/15-03/30/15   colon/rectal  . Vitamin D deficiency   . Wears glasses   . Wears glasses     Past Surgical History:  Procedure Laterality Date  . ABDOMINAL HYSTERECTOMY  1996   uterus and cervix  . BACK SURGERY  02/12/2018   lumbar surgery  . COLON RESECTION N/A 07/12/2016   Procedure: LAPAROSCOPIC LYSIS OF ADHESIONS, OMENTOPEXY, HAND ASSISTED RESECTION OF  COLON, END TO END ANASTOMOSIS, COLOSTOMY;  Surgeon: Michael Boston, MD;  Location: [facility];  Service: General;  Laterality: N/A;  . COMBINED HYSTEROSCOPY DIAGNOSTIC / D&C  x2 1990's  . DIAGNOSTIC LAPAROSCOPY  age 57 and age 89  . EUS N/A 01/18/2015   Procedure: LOWER ENDOSCOPIC ULTRASOUND (EUS);  Surgeon: Arta Silence, MD;  Location: [facility];  Service: Endoscopy;  Laterality: N/A;  . EXCISION OF SKIN TAG  11/17/2015   Procedure: EXCISION OF SKIN TAG;  Surgeon: Michael Boston, MD;  Location: [facility];  Service: General;;  . ILEO LOOP COLOSTOMY CLOSURE N/A 11/17/2015   Procedure: LAPAROSCOPIC DIVERTING LOOP ILEOSTOMY  DRAINAGE OF PELVIC ABSCESS;  Surgeon: Michael Boston, MD;  Location: [facility];  Service: General;  Laterality: N/A;  . ILEOSTOMY CLOSURE N/A 05/31/2016   Procedure: TAKEDOWN LOOP ILEOSTOMY;  Surgeon: Michael Boston, MD;  Location: [facility];  Service: General;  Laterality: N/A;  . IMPACTION REMOVAL  11/17/2015   Procedure: DISIMPACTION REMOVAL;  Surgeon: Michael Boston, MD;  Location: [facility];  Service: General;;  . INSERTION OF MESH N/A 07/23/2018   Procedure: INSERTION OF MESH X2;  Surgeon: Michael Boston, MD;  Location: [facility];  Service: General;  Laterality: N/A;  . IR LUMBAR [place]  W/IMG GUIDE  07/01/2019  . KNEE ARTHROSCOPY Left 1990's  . KNEE ARTHROSCOPY W/ MENISCECTOMY Left 09/14/2009   and chondroplasty debridement  . LAPAROSCOPIC LYSIS OF ADHESIONS  11/17/2015   Procedure: LAPAROSCOPIC LYSIS OF ADHESIONS;  Surgeon: Michael Boston, MD;  Location: [facility];  Service: General;;  . LUMBAR WOUND DEBRIDEMENT N/A 04/24/2018   Procedure: wound exploration, irrigation and debridement;  Surgeon: Eustace Moore, MD;  Location: [facility];  Service: Neurosurgery;  Laterality: N/A;  wound exploration, irrigation and debridement  . LYSIS OF ADHESION N/A 07/23/2018   Procedure: LYSIS OF ADHESIONS;  Surgeon: Michael Boston, MD;  Location[facility];  Service: General;  Laterality: N/A;  . PORT-A-CATH REMOVAL N/A 11/21/2016   Procedure: REMOVAL PORT-A-CATH;  Surgeon: Michael Boston, MD;  Location: [facility];  Service: General;  Laterality: N/A;  . PORTACATH PLACEMENT N/A 05/25/2015   Procedure: INSERTION PORT-A-CATH;  Surgeon: Michael Boston, MD;  Location: [facility];  Service: General;  Laterality: N/A;-remains inplace Right chest.  . ROTATOR CUFF REPAIR Right 2006  . TONSILLECTOMY  age 2  . VENTRAL HERNIA REPAIR N/A 07/23/2018   Procedure: LAPAROSCOPIC VENTRAL WALL HERNIA REPAIR;  Surgeon: Michael Boston, MD;  [facility];  Service: General;  Laterality: N/A;  . XI ROBOTIC ASSISTED LOWER ANTERIOR RESECTION N/A 05/25/2015   Procedure: XI ROBOTIC ASSISTED LOWER ANTERIOR RESECTION, , RIGID PROCTOSCOPY, RIGHT OOPHORECTOMY;  Surgeon: Michael Boston, MD;  [facility];  Service: General;  Laterality: N/A;    Social History:  reports that she quit smoking about 29 years ago. Her smoking use included cigarettes. She has a 1.75 pack-year smoking history. She has never used smokeless tobacco. She reports that she does not drink alcohol and does not use drugs.  Allergies  Allergen Reactions  . Caine-1 [Lidocaine] Swelling and Rash    Eyes swell shut; includes all caine drugs except  marcaine. EMLA cream OK though (?!)  . Sulfa Antibiotics Nausea And Vomiting and Rash  . Adhesive [Tape] Other (See Comments)    Blisters - can use paper tape  . Doxycycline Nausea Only  . Flagyl [Metronidazole] Nausea Only  . Iron Nausea Only  . Oxycodone Other (See Comments)    NIGHTMARES. (tolerates hydrocodone or tramadol better)  . Penicillins Rash    Has patient had a PCN reaction causing immediate rash, facial/tongue/throat swelling, SOB or lightheadedness with hypotension: no Has patient had a PCN reaction causing severe rash involving mucus membranes or skin necrosis: no Has patient had a PCN reaction that required hospitalization no Has patient had a PCN reaction occurring within the last 10 years: no If all of the above answers are "NO", then may proceed with Cephalosporin use.     Family History  Problem Relation Age of Onset  . Coronary artery disease Mother 58  . Hypertension Mother   . Hyperlipidemia Mother   . Diabetes Mellitus I Mother   . Coronary artery disease Father   . Hyperlipidemia Father   . Hypertension Father   . Cancer Sister        skin - non melanoma  . Hyperlipidemia Brother   . Cancer Maternal Uncle 72       pancreatic with mets to colon and prostate  . Cirrhosis Maternal Uncle   . Hypertension Maternal Grandmother   . Diabetes Mellitus I Maternal Grandmother   . Hyperlipidemia Maternal Grandmother   . CVA Maternal Grandmother   . Hypertension Maternal Grandfather   . Coronary artery disease Maternal Grandfather 65  . Hyperlipidemia Maternal Grandfather   . Coronary artery disease Paternal Grandmother   . Hypertension Paternal Grandmother   . Hyperlipidemia Paternal Grandmother   . Diabetes Mellitus I Paternal Grandmother   . Hypertension Paternal Grandfather   . Hyperlipidemia Paternal Grandfather   . Coronary artery disease Paternal Grandfather      Prior to Admission medications   Medication Sig Start Date End Date Taking?  Authorizing Provider  bisoprolol (ZEBETA) 10 MG tablet Take 10 mg by mouth daily. 11/30/19  Yes [provider]  celecoxib (CELEBREX) 200 MG capsule TAKE ONE CAPSULE BY MOUTH TWICE DAILY Patient taking differently: Take 200 mg by mouth 2 (two) times daily. 12/22/19  Yes Truitt Merle, MD  Cholecalciferol (VITAMIN D3) 5000 units CAPS Take 5,000 Units by mouth at bedtime.    Yes [provider]  Coenzyme Q10 (COQ10) 200 MG CAPS Take 200 mg by mouth at bedtime.   Yes [provider]  DULoxetine (CYMBALTA) 60 MG capsule Take 120 mg by mouth at bedtime.    Yes [provider]  esomeprazole (NEXIUM) 40 MG capsule Take 1 capsule (40 mg total) by mouth 2 (two) times daily before a meal. 12/22/15  Yes Gross, Remo Lipps, MD  fentaNYL (DURAGESIC) 100 MCG/HR 1 patch every 3 (three) days. 11/03/19  Yes [provider]  furosemide (LASIX) 20 MG tablet Take 20 mg by mouth daily as needed for edema.    Yes [provider]  gabapentin (NEURONTIN) 800 MG tablet Take 800 mg by mouth 3 (three) times daily. 12/23/19  Yes [provider]  HYDROcodone-acetaminophen (NORCO) 10-325 MG tablet Take 1 tablet by mouth every 8 (eight) hours as needed for severe pain. 08/25/19  Yes Truitt Merle, MD  levETIRAcetam (KEPPRA) 500 MG tablet Take 500 mg by mouth 2 (two) times daily. 11/16/19  Yes [provider]  Melatonin 3 MG TABS Take 3-6 mg by mouth at bedtime.    Yes [provider]  Multiple Vitamin (MULTIVITAMIN WITH MINERALS) TABS tablet Take 2 tablets by mouth at bedtime.    Yes [provider]  polyethylene glycol (MIRALAX / GLYCOLAX) packet Take 17 g by mouth daily as needed for moderate constipation.   Yes [provider]  promethazine (PHENERGAN) 25 MG tablet TAKE ONE TABLET BY MOUTH EVERY 6 HOURS AS NEEDED FOR NAUSEA OR VOMITING Patient taking differently: Take 25 mg by mouth every 6 (six) hours as needed for nausea or vomiting. 07/04/20  Yes  Tommy Medal, Lavell Islam, MD  rosuvastatin (CRESTOR) 5 MG tablet Take 5 mg by mouth at bedtime.   Yes [provider]  triamcinolone lotion (KENALOG) 0.1 % Apply 1 application topically as needed (dry skin).   Yes [provider]  cefdinir (OMNICEF) 300 MG capsule Take 1 capsule (300 mg total) by mouth 2 (two) times daily. Patient not taking: Reported on 10/12/2020 08/03/19   Tommy Medal, Lavell Islam, MD  doxycycline (VIBRA-TABS) 100 MG tablet Take 1 tablet (100 mg total) by mouth 2 (two) times daily. Patient not taking: Reported on 10/12/2020 08/03/19   Truman Hayward, MD    Physical Exam: Vitals:   10/12/20 2115 10/12/20 2130 10/12/20 2230 10/12/20 2306  BP: (!) 156/91 (!) 165/97 (!) 168/96 (!) 177/95  Pulse: 96 (!) 101 99 77  Resp: 18 17 18 20   Temp:    98.7 F (37.1 C)  TempSrc:    Oral  SpO2: 97% 95% 96% 100%  Weight:      Height:       Constitutional: Obese woman resting supine in bed, NAD, calm, comfortable Eyes: PERRL, lids and conjunctivae normal ENMT: Mucous membranes are moist. Posterior pharynx clear of any exudate or lesions.Normal dentition.  Neck: normal, supple, no masses. Respiratory: clear to auscultation anteriorly.  Normal respiratory effort. No accessory muscle use.  Cardiovascular: Regular rate and rhythm, soft systolic murmur present. No extremity edema. 2+ pedal pulses. Abdomen: Mild generalized tenderness, no masses palpated.  Colostomy in place left abdomen without any output. Bowel sounds diminished.  Musculoskeletal: no clubbing / cyanosis. No joint deformity upper and lower extremities. Good ROM, no contractures. Normal muscle tone.  Skin: no rashes, lesions, ulcers. No induration Neurologic: CN 2-12 grossly intact. Sensation intact. Strength 5/5 in all 4.  Psychiatric: Normal judgment and insight. Alert and oriented x 3. Normal mood.   Labs on Admission: I have personally reviewed following labs and imaging studies  CBC: Recent Labs  Lab  10/12/20 1629  WBC 8.0  NEUTROABS 6.2  HGB 14.3  HCT 43.9  MCV  84.6  PLT 539   Basic Metabolic Panel: Recent Labs  Lab 10/12/20 1629  NA 137  K 3.3*  CL 98  CO2 24  GLUCOSE 137*  BUN 15  CREATININE 0.87  CALCIUM 10.3  MG 1.8  PHOS 2.1*   GFR: Estimated Creatinine Clearance: 114.4 mL/min (by C-G formula based on SCr of 0.87 mg/dL). Liver Function Tests: Recent Labs  Lab 10/12/20 1629  AST 23  ALT 27  ALKPHOS 73  BILITOT 1.0  PROT 8.1  ALBUMIN 4.6   Recent Labs  Lab 10/12/20 1629  LIPASE 28   No results for input(s): AMMONIA in the last 168 hours. Coagulation Profile: No results for input(s): INR, PROTIME in the last 168 hours. Cardiac Enzymes: No results for input(s): CKTOTAL, CKMB, CKMBINDEX, TROPONINI in the last 168 hours. BNP (last 3 results) No results for input(s): PROBNP in the last 8760 hours. HbA1C: No results for input(s): HGBA1C in the last 72 hours. CBG: No results for input(s): GLUCAP in the last 168 hours. Lipid Profile: No results for input(s): CHOL, HDL, LDLCALC, TRIG, CHOLHDL, LDLDIRECT in the last 72 hours. Thyroid Function Tests: No results for input(s): TSH, T4TOTAL, FREET4, T3FREE, THYROIDAB in the last 72 hours. Anemia Panel: No results for input(s): VITAMINB12, FOLATE, FERRITIN, TIBC, IRON, RETICCTPCT in the last 72 hours. Urine analysis:    Component Value Date/Time   COLORURINE YELLOW 07/15/2019 0211   APPEARANCEUR HAZY (A) 07/15/2019 0211   LABSPEC 1.009 07/15/2019 0211   LABSPEC 1.010 02/26/2016 1604   PHURINE 9.0 (H) 07/15/2019 0211   GLUCOSEU NEGATIVE 07/15/2019 0211   GLUCOSEU Negative 02/26/2016 1604   HGBUR SMALL (A) 07/15/2019 0211   BILIRUBINUR NEGATIVE 07/15/2019 0211   BILIRUBINUR Negative 02/26/2016 1604   KETONESUR 5 (A) 07/15/2019 0211   PROTEINUR NEGATIVE 07/15/2019 0211   UROBILINOGEN 0.2 02/26/2016 1604   NITRITE NEGATIVE 07/15/2019 0211   LEUKOCYTESUR MODERATE (A) 07/15/2019 0211   LEUKOCYTESUR  Trace 02/26/2016 1604    Radiological Exams on Admission: CT ABDOMEN PELVIS W CONTRAST  Result Date: 10/12/2020 CLINICAL DATA:  Nausea and vomiting. EXAM: CT ABDOMEN AND PELVIS WITH CONTRAST TECHNIQUE: Multidetector CT imaging of the abdomen and pelvis was performed using the standard protocol following bolus administration of intravenous contrast. CONTRAST:  187m OMNIPAQUE IOHEXOL 300 MG/ML  SOLN IV contrast extravasation of 86 cc Omnipaque in the right arm. Examination is essentially a noncontrast exam, with only minimal intravascular contrast. COMPARISON:  CT 06/18/2019 FINDINGS: Lower chest: No acute airspace disease or pleural effusion. Hepatobiliary: Diffusely decreased hepatic density typical of steatosis. No focal hepatic abnormality. There is minimal layering hyperdensity in the gallbladder that may represent sludge or stones. No pericholecystic fat stranding. No biliary dilatation. Pancreas: Parenchymal atrophy. No ductal dilatation or inflammation. Spleen: Tiny central splenic hypodensities unchanged, nonspecific. Normal in size. Adrenals/Urinary Tract: Normal adrenal glands. No hydronephrosis or perinephric edema. No renal calculi. Urinary bladder is partially distended. Stomach/Bowel: Stomach distended with ingested contents/fluid. Small bowel are dilated and fluid-filled. There is fecalization of dilated small bowel contents in the pelvis. There is transition from dilated to nondilated in the right lower quadrant, though discrete transition point is not seen. No pneumatosis. Enteric sutures noted in the distal ileum. Formed stool in the ascending and transverse colon. Left mid abdominal colostomy. No significant peristomal hernia. Prior abdominal perineal resection of the distal colon. Stable presacral soft tissue density from prior exam. Vascular/Lymphatic: Abdominal aorta is normal in caliber. No aortic aneurysm. Retroperitoneal lymph nodes are not enlarged  by size criteria. There is no pelvic  adenopathy. Reproductive: Hysterectomy. Other: Similar appearance of presacral soft tissue density likely related to treatment. Within the left lower quadrant/adnexa is a elongated multiloculated fluid density structure spanning 5.1 cm in cranial caudal dimension. No surrounding inflammation. No soft tissue components. No free intra-abdominal air. No significant mesenteric edema. Musculoskeletal: Postsurgical change at L4-L5. Lucency about the pedicle screws, more so on the right. The L4 pedicle screw extend to the L3-L4 disc space. Progressive sclerosis at L3-L4 from prior exam, previous osteomyelitis on spine MRI. No acute osseous abnormalities are seen. IMPRESSION: 1. Findings consistent with small bowel obstruction with transition in the right lower quadrant, though discrete transition point is not seen. 2. Post abdominal perineal resection with left mid abdominal colostomy. Stable post treatment related presacral soft tissue density. 3. Multi loculated fluid density structure in the left lower quadrant/adnexa measuring 5.1 cm without surrounding inflammation. This is favored to be sequela of prior surgery possible peritoneal inclusion cyst or lymphocele. Attention at follow-up recommended. 4. Hepatic steatosis. 5. Sequela of L3-L4 discitis osteomyelitis. 6. Minimal layering hyperdensity in the gallbladder may represent sludge or stones. No pericholecystic fat stranding. 7. IV contrast extravasation. Aortic Atherosclerosis (ICD10-I70.0). Electronically Signed   By: Keith Rake M.D.   On: 10/12/2020 20:24    EKG: Personally reviewed. Normal sinus rhythm without acute ischemic changes.  Assessment/Plan Principal Problem:   Small bowel obstruction (HCC) Active Problems:   Hypertension   Hypokalemia   Hypophosphatemia   Lindsay Little is a 55 y.o. female with medical history significant for rectal cancer s/p resection complicated by rectovaginal fistula and intra-abdominal abscesses, colostomy  in place, lumbar discitis with vertebral osteomyelitis, hypertension, hyperlipidemia, chronic pain syndrome who is admitted with small bowel obstruction.  Small bowel obstruction History of rectal cancer s/p resection c/b rectovaginal fistula and intra-abdominal abscess with colostomy in place: SBO changes seen on CT imaging.  Not actively vomiting therefore NG tube deferred. -General surgery consulted and see in a.m. -Keep n.p.o. -Continue antiemetics and analgesics as needed -Continue IV fluid hydration overnight  Hypokalemia/hypophosphatemia: Supplementing IV potassium, magnesium, phosphorus.  Hypertension: Oral meds on hold while NPO.  Continue IV metoprolol.  Lumbar discitis with vertebral osteomyelitis: Has been followed by ID, Dr. Tommy Medal.  Completed 1 year course of cefdinir and doxycycline.  Chronic pain syndrome: Home Norco on hold while NPO.  Continue IV morphine as needed.  Hyperlipidemia: Rosuvastatin on hold while NPO.  DVT prophylaxis: Lovenox Code Status: Full code, confirmed with patient Family Communication: Discussed with patient, she has discussed with family Disposition Plan: From home and likely discharge to home pending resolution of SBO Consults called: General surgery Level of care: Telemetry Admission status:  Status is: Observation  The patient remains OBS appropriate and will d/c before 2 midnights.  Dispo: The patient is from: Home              Anticipated d/c is to: Home              Patient currently is not medically stable to d/c.   Difficult to place patient No  Zada Finders MD [facility]  If 7PM-7AM, please contact night-coverage [email or web address]  10/13/2020, 12:31 AM

## 2020-10-13 NOTE — Plan of Care (Signed)

## 2020-10-14 ENCOUNTER — Inpatient Hospital Stay (HOSPITAL_COMMUNITY): Payer: Managed Care, Other (non HMO)

## 2020-10-14 DIAGNOSIS — I10 Essential (primary) hypertension: Secondary | ICD-10-CM

## 2020-10-14 LAB — BASIC METABOLIC PANEL
Anion gap: 6 (ref 5–15)
BUN: 10 mg/dL (ref 6–20)
CO2: 23 mmol/L (ref 22–32)
Calcium: 9.1 mg/dL (ref 8.9–10.3)
Chloride: 108 mmol/L (ref 98–111)
Creatinine, Ser: 0.71 mg/dL (ref 0.44–1.00)
GFR, Estimated: >60 mL/min (ref >60)
Glucose, Bld: 82 mg/dL (ref 70–99)
Potassium: 4.5 mmol/L (ref 3.5–5.1)
Sodium: 137 mmol/L (ref 135–145)

## 2020-10-14 LAB — CBC WITH DIFFERENTIAL/PLATELET
Abs Immature Granulocytes: 0.04 10*3/uL (ref 0.00–0.07)
Basophils Absolute: 0 10*3/uL (ref 0.0–0.1)
Basophils Relative: 1 %
Eosinophils Absolute: 0.2 10*3/uL (ref 0.0–0.5)
Eosinophils Relative: 2 %
HCT: 38.4 % (ref 36.0–46.0)
Hemoglobin: 12 g/dL (ref 12.0–15.0)
Immature Granulocytes: 1 %
Lymphocytes Relative: 24 %
Lymphs Abs: 1.8 10*3/uL (ref 0.7–4.0)
MCH: 27.6 pg (ref 26.0–34.0)
MCHC: 31.3 g/dL (ref 30.0–36.0)
MCV: 88.5 fL (ref 80.0–100.0)
Monocytes Absolute: 0.6 10*3/uL (ref 0.1–1.0)
Monocytes Relative: 7 %
Neutro Abs: 4.8 10*3/uL (ref 1.7–7.7)
Neutrophils Relative %: 65 %
Platelets: 229 10*3/uL (ref 150–400)
RBC: 4.34 MIL/uL (ref 3.87–5.11)
RDW: 13.2 % (ref 11.5–15.5)
WBC: 7.4 10*3/uL (ref 4.0–10.5)
nRBC: 0 % (ref 0.0–0.2)

## 2020-10-14 MED ORDER — HYDRALAZINE HCL 20 MG/ML IJ SOLN: 10.0000 mg | Freq: Four times a day (QID) | INTRAMUSCULAR | Status: DC | PRN

## 2020-10-14 MED ORDER — FENTANYL 100 MCG/HR TD PT72
1.0000 | MEDICATED_PATCH | TRANSDERMAL | Status: DC
Start: 2020-10-14 — End: 2020-10-17
  Administered 2020-10-14: 1 via TRANSDERMAL
  Filled 2020-10-14: qty 1

## 2020-10-14 NOTE — Progress Notes (Signed)
Pt has been up several times throughout the day and ambulated in hall with Probation officer. Tolerated well with mild SOB noted. Pt able to walk one full lap around unit. Burped the colostomy and sat in chair. Will cont to monitor and check pt. SRP, RN

## 2020-10-14 NOTE — Progress Notes (Signed)
Subjective/Chief Complaint: Pt with less nausea  No abdominal pain Scant out ostomy    Objective: Vital signs in last 24 hours: Temp:  [97.6 F (36.4 C)-98.4 F (36.9 C)] 98.4 F (36.9 C) (04/02 0523) Pulse Rate:  [56-72] 56 (04/02 0523) Resp:  [12-20] 19 (04/01 2025) BP: (148-183)/(76-101) 158/78 (04/02 0523) SpO2:  [97 %-100 %] 97 % (04/02 0523) Last BM Date: 10/10/20  Intake/Output from previous day: 04/01 0701 - 04/02 0700 In: 1267.3 [I.V.:967.3; IV Piggyback:300] Out: 1300 [Urine:1300] Intake/Output this shift: No intake/output data recorded.   Gen: A&Ox3, no distress  Eyes: lids and conjunctivae normal, no icterus. Pupils equally round and reactive to light.  Neck: supple without mass or thyromegaly Chest: respiratory effort is normal.  Symmetrical air entry Cardiovascular: RRR,  no pedal edema Gastrointestinal: Obese, soft, mildly tender in the upper abdomen.  Stoma with gas and small amount of stool in the bag. Muscoloskeletal: no clubbing or cyanosis of the fingers.  Strength is symmetrical throughout.  Range of motion of bilateral upper and lower extremities normal without pain, crepitation or contracture. Neuro: cranial nerves grossly intact.  Sensation intact to light touch diffusely. Psych: appropriate mood and affect, normal insight/judgment intact  Skin: warm and dry  Lab Results:  Recent Labs    10/13/20 0350 10/14/20 0428  WBC 8.9 7.4  HGB 13.6 12.0  HCT 42.2 38.4  PLT 299 229   BMET Recent Labs    10/13/20 0350 10/14/20 0428  NA 136 137  K 3.5 4.5  CL 105 108  CO2 20* 23  GLUCOSE 126* 82  BUN 13 10  CREATININE 0.78 0.71  CALCIUM 9.3 9.1   PT/INR No results for input(s): LABPROT, INR in the last 72 hours. ABG No results for input(s): PHART, HCO3 in the last 72 hours.  Invalid input(s): PCO2, PO2  Studies/Results: CT ABDOMEN PELVIS W CONTRAST  Result Date: 10/12/2020 CLINICAL DATA:  Nausea and vomiting. EXAM: CT ABDOMEN  AND PELVIS WITH CONTRAST TECHNIQUE: Multidetector CT imaging of the abdomen and pelvis was performed using the standard protocol following bolus administration of intravenous contrast. CONTRAST:  154m OMNIPAQUE IOHEXOL 300 MG/ML  SOLN IV contrast extravasation of 86 cc Omnipaque in the right arm. Examination is essentially a noncontrast exam, with only minimal intravascular contrast. COMPARISON:  CT 06/18/2019 FINDINGS: Lower chest: No acute airspace disease or pleural effusion. Hepatobiliary: Diffusely decreased hepatic density typical of steatosis. No focal hepatic abnormality. There is minimal layering hyperdensity in the gallbladder that may represent sludge or stones. No pericholecystic fat stranding. No biliary dilatation. Pancreas: Parenchymal atrophy. No ductal dilatation or inflammation. Spleen: Tiny central splenic hypodensities unchanged, nonspecific. Normal in size. Adrenals/Urinary Tract: Normal adrenal glands. No hydronephrosis or perinephric edema. No renal calculi. Urinary bladder is partially distended. Stomach/Bowel: Stomach distended with ingested contents/fluid. Small bowel are dilated and fluid-filled. There is fecalization of dilated small bowel contents in the pelvis. There is transition from dilated to nondilated in the right lower quadrant, though discrete transition point is not seen. No pneumatosis. Enteric sutures noted in the distal ileum. Formed stool in the ascending and transverse colon. Left mid abdominal colostomy. No significant peristomal hernia. Prior abdominal perineal resection of the distal colon. Stable presacral soft tissue density from prior exam. Vascular/Lymphatic: Abdominal aorta is normal in caliber. No aortic aneurysm. Retroperitoneal lymph nodes are not enlarged by size criteria. There is no pelvic adenopathy. Reproductive: Hysterectomy. Other: Similar appearance of presacral soft tissue density likely related to treatment. Within the  left lower quadrant/adnexa is a  elongated multiloculated fluid density structure spanning 5.1 cm in cranial caudal dimension. No surrounding inflammation. No soft tissue components. No free intra-abdominal air. No significant mesenteric edema. Musculoskeletal: Postsurgical change at L4-L5. Lucency about the pedicle screws, more so on the right. The L4 pedicle screw extend to the L3-L4 disc space. Progressive sclerosis at L3-L4 from prior exam, previous osteomyelitis on spine MRI. No acute osseous abnormalities are seen. IMPRESSION: 1. Findings consistent with small bowel obstruction with transition in the right lower quadrant, though discrete transition point is not seen. 2. Post abdominal perineal resection with left mid abdominal colostomy. Stable post treatment related presacral soft tissue density. 3. Multi loculated fluid density structure in the left lower quadrant/adnexa measuring 5.1 cm without surrounding inflammation. This is favored to be sequela of prior surgery possible peritoneal inclusion cyst or lymphocele. Attention at follow-up recommended. 4. Hepatic steatosis. 5. Sequela of L3-L4 discitis osteomyelitis. 6. Minimal layering hyperdensity in the gallbladder may represent sludge or stones. No pericholecystic fat stranding. 7. IV contrast extravasation. Aortic Atherosclerosis (ICD10-I70.0). Electronically Signed   By: Keith Rake M.D.   On: 10/12/2020 20:24    Anti-infectives: Anti-infectives (From admission, onward)   None      Assessment/Plan: pSBO- feels better   Try clears  KUB Hold off on NGT     LOS: 1 day    Marcello Moores A Barry Faircloth 10/14/2020

## 2020-10-14 NOTE — Progress Notes (Signed)
Duragesic Fentanyl 100 mcg applied to right arm as ordered. SRP, RN

## 2020-10-14 NOTE — Progress Notes (Addendum)
[facility] PROGRESS NOTE   Lindsay Little NUU:[id number] DOB: Jul 25, 1965 DOA: 10/12/2020  PCP: [name], MD  Brief History/Interval Summary: 55 y.o. female with medical history significant for rectal cancer s/p resection complicated by rectovaginal fistula and intra-abdominal abscesses, colostomy in place, lumbar discitis with vertebral osteomyelitis, hypertension, hyperlipidemia, chronic pain syndrome who presented to the ED for evaluation of abdominal pain and vomiting.  Patient found to have evidence for small bowel obstruction on CT scan.  Was hospitalized for further management.  General surgery was consulted.  Patient declined NG tube.  Consultants: General surgery  Procedures: None  Antibiotics: Anti-infectives (From admission, onward)   None      Subjective/Interval History: Patient mentions that her nausea seems to be better this morning.  Denies any abdominal pain.  Has been passing gas through her ostomy.  No bowel movements yet.  She has been ambulating as well.     Assessment/Plan:  Partial small bowel obstruction in the setting of history of rectal cancer status post resection and presence of colostomy Patient last surgery was more than 2 years ago.  She mentions that she has never been hospitalized for similar problem in the past.  She has been very hesitant to have an NG tube placed.   General surgery continues to follow.  Patient seems to be slightly better today.    Essential hypertension Oral medicines on hold due to the small bowel obstruction.  She has been placed on IV metoprolol.  Elevated blood pressures noted.  Continue metoprolol for now.  May also use hydralazine for significantly elevated blood pressures.  Hypokalemia and hypophosphatemia Potassium level has improved.  Phosphorus level had also improved.  Continue to check daily.    History of lumbar discitis with vertebral osteomyelitis No longer on any antibiotics.  Followed by  Dr. [name] with ID.  Chronic pain syndrome Uses duragesic patch and keppra at home. Follows with pain med specialist who is trying to get her off some of these medications. Does not use Keppra for seizures. Duragesic patch to be continued.  Hyperlipidemia Statin is on hold.  Left adnexal fluid density Enough multiloculated fluid density in the left lower abdomen/adnexa measuring about 5.1 cm was noted on CT scan.  No surrounding inflammation noted.  Not noted on CT scan done in 2020.  This will need outpatient evaluation.  Obesity, class III Estimated body mass index is 41.84 kg/m as calculated from the following:   Height as of this encounter: 5' 11"  (1.803 m).   Weight as of this encounter: 136.1 kg.   DVT Prophylaxis: Lovenox Code Status: Full code Family Communication: Discussed with the patient.  No family at bedside Disposition Plan: Hopefully return home when improved.  Mobilize.  Status is: Inpatient  Remains inpatient appropriate because:IV treatments appropriate due to intensity of illness or inability to take PO and Inpatient level of care appropriate due to severity of illness   Dispo: The patient is from: Home              Anticipated d/c is to: Home              Patient currently is not medically stable to d/c.   Difficult to place patient No        Medications:  Scheduled: . enoxaparin (LOVENOX) injection  60 mg Subcutaneous Q24H  . metoprolol tartrate  5 mg Intravenous Q6H  . pantoprazole (PROTONIX) IV  40 mg Intravenous QHS   Continuous: . 0.9 %  NaCl with KCl 20 mEq / L 100 mL/hr at 10/14/20 0937  . promethazine (PHENERGAN) injection 12.5 mg (10/13/20 1942)   DJT:[id number], morphine injection, ondansetron (ZOFRAN) IV, promethazine (PHENERGAN) injection   Objective:  Vital Signs  Vitals:   10/13/20 1320 10/13/20 1341 10/13/20 2025 10/14/20 0523  BP: (!) 183/101 (!) 148/76 (!) 161/77 (!) 158/78  Pulse: 60 69 (!) 57 (!) 56  Resp: 20 18  19    Temp: 98 F (36.7 C) 98.2 F (36.8 C) 97.6 F (36.4 C) 98.4 F (36.9 C)  TempSrc: Oral Oral Oral Oral  SpO2: 100% 98% 100% 97%  Weight:      Height:        Intake/Output Summary (Last 24 hours) at 10/14/2020 1004 Last data filed at 10/14/2020 0641 Gross per 24 hour  Intake 1267.32 ml  Output 1300 ml  Net -32.68 ml   Filed Weights   10/12/20 1609  Weight: 136.1 kg    General appearance: Awake alert.  In no distress Resp: Clear to auscultation bilaterally.  Normal effort Cardio: S1-S2 is normal regular.  No S3-S4.  No rubs murmurs or bruit GI: Abdomen is soft.  Bowel sounds heard but sluggish.  Nontender.  Ostomy is noted.   Extremities: No edema.  Full range of motion of lower extremities. Neurologic: Alert and oriented x3.  No focal neurological deficits.     Lab Results:  Data Reviewed: I have personally reviewed following labs and imaging studies  CBC: Recent Labs  Lab 10/12/20 1629 10/13/20 0350 10/14/20 0428  WBC 8.0 8.9 7.4  NEUTROABS 6.2 6.7 4.8  HGB 14.3 13.6 12.0  HCT 43.9 42.2 38.4  MCV 84.6 86.3 88.5  PLT 291 299 300    Basic Metabolic Panel: Recent Labs  Lab 10/12/20 1629 10/13/20 0350 10/14/20 0428  NA 137 136 137  K 3.3* 3.5 4.5  CL 98 105 108  CO2 24 20* 23  GLUCOSE 137* 126* 82  BUN 15 13 10   CREATININE 0.87 0.78 0.71  CALCIUM 10.3 9.3 9.1  MG 1.8 2.1  --   PHOS 2.1* 3.8  --     GFR: Estimated Creatinine Clearance: 124.4 mL/min (by C-G formula based on SCr of 0.71 mg/dL).  Liver Function Tests: Recent Labs  Lab 10/12/20 1629  AST 23  ALT 27  ALKPHOS 73  BILITOT 1.0  PROT 8.1  ALBUMIN 4.6    Recent Labs  Lab 10/12/20 1629  LIPASE 28     Recent Results (from the past 240 hour(s))  Resp Panel by RT-PCR (Flu A&B, Covid) Nasopharyngeal Swab     Status: None   Collection Time: 10/12/20 10:40 PM   Specimen: Nasopharyngeal Swab; Nasopharyngeal(NP) swabs in vial transport medium  Result Value Ref Range Status    SARS Coronavirus 2 by RT PCR NEGATIVE NEGATIVE Final    Comment: (NOTE) SARS-CoV-2 target nucleic acids are NOT DETECTED.  The SARS-CoV-2 RNA is generally detectable in upper respiratory specimens during the acute phase of infection. The lowest concentration of SARS-CoV-2 viral copies this assay can detect is 138 copies/mL. A negative result does not preclude SARS-Cov-2 infection and should not be used as the sole basis for treatment or other patient management decisions. A negative result may occur with  improper specimen collection/handling, submission of specimen other than nasopharyngeal swab, presence of viral mutation(s) within the areas targeted by this assay, and inadequate number of viral copies(<138 copies/mL). A negative result must be combined with clinical observations, patient history, and epidemiological  information. The expected result is Negative.  Fact Sheet for Patients:  EntrepreneurPulse.com.au  Fact Sheet for Healthcare Providers:  IncredibleEmployment.be  This test is no t yet approved or cleared by the [place] FDA and  has been authorized for detection and/or diagnosis of SARS-CoV-2 by FDA under an Emergency Use Authorization (EUA). This EUA will remain  in effect (meaning this test can be used) for the duration of the COVID-19 declaration under Section 564(b)(1) of the Act, 21 U.S.C.section 360bbb-3(b)(1), unless the authorization is terminated  or revoked sooner.       Influenza A by PCR NEGATIVE NEGATIVE Final   Influenza B by PCR NEGATIVE NEGATIVE Final    Comment: (NOTE) The Xpert Xpress SARS-CoV-2/FLU/RSV plus assay is intended as an aid in the diagnosis of influenza from Nasopharyngeal swab specimens and should not be used as a sole basis for treatment. Nasal washings and aspirates are unacceptable for Xpert Xpress SARS-CoV-2/FLU/RSV testing.  Fact Sheet for  Patients: EntrepreneurPulse.com.au  Fact Sheet for Healthcare Providers: IncredibleEmployment.be  This test is not yet approved or cleared by the [place] FDA and has been authorized for detection and/or diagnosis of SARS-CoV-2 by FDA under an Emergency Use Authorization (EUA). This EUA will remain in effect (meaning this test can be used) for the duration of the COVID-19 declaration under Section 564(b)(1) of the Act, 21 U.S.C. section 360bbb-3(b)(1), unless the authorization is terminated or revoked.  Performed at [facility]       Radiology Studies: CT ABDOMEN PELVIS W CONTRAST  Result Date: 10/12/2020 CLINICAL DATA:  Nausea and vomiting. EXAM: CT ABDOMEN AND PELVIS WITH CONTRAST TECHNIQUE: Multidetector CT imaging of the abdomen and pelvis was performed using the standard protocol following bolus administration of intravenous contrast. CONTRAST:  122m OMNIPAQUE IOHEXOL 300 MG/ML  SOLN IV contrast extravasation of 86 cc Omnipaque in the right arm. Examination is essentially a noncontrast exam, with only minimal intravascular contrast. COMPARISON:  CT 06/18/2019 FINDINGS: Lower chest: No acute airspace disease or pleural effusion. Hepatobiliary: Diffusely decreased hepatic density typical of steatosis. No focal hepatic abnormality. There is minimal layering hyperdensity in the gallbladder that may represent sludge or stones. No pericholecystic fat stranding. No biliary dilatation. Pancreas: Parenchymal atrophy. No ductal dilatation or inflammation. Spleen: Tiny central splenic hypodensities unchanged, nonspecific. Normal in size. Adrenals/Urinary Tract: Normal adrenal glands. No hydronephrosis or perinephric edema. No renal calculi. Urinary bladder is partially distended. Stomach/Bowel: Stomach distended with ingested contents/fluid. Small bowel are dilated and fluid-filled. There is fecalization of dilated small bowel contents in the  pelvis. There is transition from dilated to nondilated in the right lower quadrant, though discrete transition point is not seen. No pneumatosis. Enteric sutures noted in the distal ileum. Formed stool in the ascending and transverse colon. Left mid abdominal colostomy. No significant peristomal hernia. Prior abdominal perineal resection of the distal colon. Stable presacral soft tissue density from prior exam. Vascular/Lymphatic: Abdominal aorta is normal in caliber. No aortic aneurysm. Retroperitoneal lymph nodes are not enlarged by size criteria. There is no pelvic adenopathy. Reproductive: Hysterectomy. Other: Similar appearance of presacral soft tissue density likely related to treatment. Within the left lower quadrant/adnexa is a elongated multiloculated fluid density structure spanning 5.1 cm in cranial caudal dimension. No surrounding inflammation. No soft tissue components. No free intra-abdominal air. No significant mesenteric edema. Musculoskeletal: Postsurgical change at L4-L5. Lucency about the pedicle screws, more so on the right. The L4 pedicle screw extend to the L3-L4 disc space. Progressive sclerosis at  L3-L4 from prior exam, previous osteomyelitis on spine MRI. No acute osseous abnormalities are seen. IMPRESSION: 1. Findings consistent with small bowel obstruction with transition in the right lower quadrant, though discrete transition point is not seen. 2. Post abdominal perineal resection with left mid abdominal colostomy. Stable post treatment related presacral soft tissue density. 3. Multi loculated fluid density structure in the left lower quadrant/adnexa measuring 5.1 cm without surrounding inflammation. This is favored to be sequela of prior surgery possible peritoneal inclusion cyst or lymphocele. Attention at follow-up recommended. 4. Hepatic steatosis. 5. Sequela of L3-L4 discitis osteomyelitis. 6. Minimal layering hyperdensity in the gallbladder may represent sludge or stones. No  pericholecystic fat stranding. 7. IV contrast extravasation. Aortic Atherosclerosis (ICD10-I70.0). Electronically Signed   By: Keith Rake M.D.   On: 10/12/2020 20:24       LOS: 1 day   [facility] Pager on [email or web address]  10/14/2020, 10:04 AM

## 2020-10-14 NOTE — Progress Notes (Signed)
Called ABD X-ray report to General Surgery-Dr. Marlou Starks, pt will resume NPO and MD will assess in am.   IMPRESSION: Continued evidence of dilated air-filled small bowel loops in the central abdomen suggesting early/partial small bowel obstruction.  SRP,RN

## 2020-10-14 NOTE — Plan of Care (Signed)
  Problem: Coping: Goal: Level of anxiety will decrease Outcome: Adequate for Discharge

## 2020-10-14 NOTE — Progress Notes (Signed)
Pt states her Duragesic patch (64mg) is due to be changed tonight, she wears for discitis (back and leg pain). MD updated and new orders noted. SRP, RN

## 2020-10-14 NOTE — Plan of Care (Signed)
  Problem: Elimination: Goal: Will not experience complications related to urinary retention Outcome: Progressing   Problem: Nutrition: Goal: Adequate nutrition will be maintained Outcome: Progressing   Problem: Pain Managment: Goal: General experience of comfort will improve Outcome: Progressing

## 2020-10-14 NOTE — Plan of Care (Signed)
  Problem: Education: Goal: Knowledge of General Education information will improve Description Including pain rating scale, medication(s)/side effects and non-pharmacologic comfort measures Outcome: Progressing   

## 2020-10-15 DIAGNOSIS — G894 Chronic pain syndrome: Secondary | ICD-10-CM

## 2020-10-15 LAB — BASIC METABOLIC PANEL
Anion gap: 9 (ref 5–15)
BUN: 8 mg/dL (ref 6–20)
CO2: 23 mmol/L (ref 22–32)
Calcium: 9.4 mg/dL (ref 8.9–10.3)
Chloride: 104 mmol/L (ref 98–111)
Creatinine, Ser: 0.65 mg/dL (ref 0.44–1.00)
GFR, Estimated: >60 mL/min (ref >60)
Glucose, Bld: 80 mg/dL (ref 70–99)
Potassium: 4 mmol/L (ref 3.5–5.1)
Sodium: 136 mmol/L (ref 135–145)

## 2020-10-15 NOTE — Plan of Care (Signed)
  Problem: Education: Goal: Knowledge of General Education information will improve Description: Including pain rating scale, medication(s)/side effects and non-pharmacologic comfort measures Outcome: Progressing   Problem: Clinical Measurements: Goal: Diagnostic test results will improve Outcome: Progressing   Problem: Elimination: Goal: Will not experience complications related to urinary retention Outcome: Progressing

## 2020-10-15 NOTE — Progress Notes (Signed)
   Subjective/Chief Complaint: Patient with nausea and vomiting yesterday after clears.  These were stopped.  KUB showed persistence partial small bowel obstruction.  Overnight she had significant increase in ostomy output.  She has no nausea or vomiting today.   Objective: Vital signs in last 24 hours: Temp:  [98.1 F (36.7 C)-98.5 F (36.9 C)] 98.2 F (36.8 C) (04/03 0532) Pulse Rate:  [63-66] 66 (04/03 0532) Resp:  [18-20] 20 (04/03 0532) BP: (140-144)/(75-81) 140/81 (04/03 0532) SpO2:  [98 %-100 %] 98 % (04/03 0532) Last BM Date: 10/15/20 (ealry morning per patient/rn-stool noted in ostomy bag)  Intake/Output from previous day: 04/02 0701 - 04/03 0700 In: 1490 [P.O.:240; I.V.:1200; IV Piggyback:50] Out: 3200 [Urine:2200; Emesis/NG output:600; Stool:400] Intake/Output this shift: No intake/output data recorded.   Gen: A&Ox3, no distress  Eyes: lids and conjunctivae normal, no icterus. Pupils equally round and reactive to light.  Neck: supple without mass or thyromegaly Chest: respiratory effort is normal.Symmetrical air entry Cardiovascular: RRR,no pedal edema Gastrointestinal:Obese, soft, mildly tender in the upper abdomen. Stoma with gas and small amount of stool in the bag. Muscoloskeletal: no clubbing or cyanosis of the fingers. Strength is symmetrical throughout. Range of motion of bilateral upper and lower extremities normal Psych: appropriate mood and affect, normal insight/judgment intact  Skin: warm and dry Lab Results:  Recent Labs    10/13/20 0350 10/14/20 0428  WBC 8.9 7.4  HGB 13.6 12.0  HCT 42.2 38.4  PLT 299 229   BMET Recent Labs    10/14/20 0428 10/15/20 0339  NA 137 136  K 4.5 4.0  CL 108 104  CO2 23 23  GLUCOSE 82 80  BUN 10 8  CREATININE 0.71 0.65  CALCIUM 9.1 9.4   PT/INR No results for input(s): LABPROT, INR in the last 72 hours. ABG No results for input(s): PHART, HCO3 in the last 72 hours.  Invalid input(s): PCO2,  PO2  Studies/Results: DG Abd 1 View  Result Date: 10/14/2020 CLINICAL DATA:  Nausea and vomiting 1 week with generalized abdominal pain. Previous bowel resection with colostomy 2017. EXAM: ABDOMEN - 1 VIEW COMPARISON:  05/08/2016 and CT 10/12/2020 FINDINGS: Exam demonstrates several dilated air-filled small bowel loops in the central abdomen as seen on recent CT suggesting early/partial small bowel obstruction. Air and contrast are present within the colon. No free peritoneal air. Fusion hardware is present over the lower lumbar spine. Remainder of the exam is unremarkable. IMPRESSION: Continued evidence of dilated air-filled small bowel loops in the central abdomen suggesting early/partial small bowel obstruction. Electronically Signed   By: Marin Olp M.D.   On: 10/14/2020 13:22    Anti-infectives: Anti-infectives (From admission, onward)   None      Assessment/Plan: pSBO- ostomy output over 400 cc Restart clears  Advance in am if no N/V    LOS: 2 days    Turner Daniels MD  10/15/2020

## 2020-10-15 NOTE — Plan of Care (Signed)
  Problem: Elimination: Goal: Will not experience complications related to urinary retention Outcome: Progressing   Problem: Skin Integrity: Goal: Risk for impaired skin integrity will decrease Outcome: Progressing

## 2020-10-15 NOTE — Progress Notes (Signed)
[facility] PROGRESS NOTE   Lindsay Little NOB:[id number] DOB: Jun 28, 1966 DOA: 10/12/2020  PCP: [name], MD  Brief History/Interval Summary: 55 y.o. female with medical history significant for rectal cancer s/p resection complicated by rectovaginal fistula and intra-abdominal abscesses, colostomy in place, lumbar discitis with vertebral osteomyelitis, hypertension, hyperlipidemia, chronic pain syndrome who presented to the ED for evaluation of abdominal pain and vomiting.  Patient found to have evidence for small bowel obstruction on CT scan.  Was hospitalized for further management.  General surgery was consulted.  Patient declined NG tube.  Consultants: General surgery  Procedures: None  Antibiotics: Anti-infectives (From admission, onward)   None      Subjective/Interval History: Patient mentions that she had an episode of vomiting overnight.  With this morning she had stool in her ostomy bag.  Nausea is better.  Has been ambulating    Assessment/Plan:  Partial small bowel obstruction in the setting of history of rectal cancer status post resection and presence of colostomy Patient last surgery was more than 2 years ago.  She mentions that she has never been hospitalized for similar problem in the past.  She has been very hesitant to have an NG tube placed.   General surgery continues to follow and manage.    Essential hypertension Oral medicines on hold due to the small bowel obstruction.  She has been placed on scheduled IV metoprolol.  Blood pressures reasonably well controlled.  Occasional high readings noted.  Hydralazine as needed as well.  Hypokalemia and hypophosphatemia Potassium level has improved.  Phosphorus level had also improved.      History of lumbar discitis with vertebral osteomyelitis No longer on any antibiotics.  Followed by Dr. [name] with ID.  Chronic pain syndrome Uses duragesic patch and keppra at home. Follows with pain med  specialist who is trying to get her off some of these medications. Does not use Keppra for seizures. Duragesic patch to be continued. Resume Keppra when she is able to take orally.  Hyperlipidemia Statin is on hold.  Left adnexal fluid density Enough multiloculated fluid density in the left lower abdomen/adnexa measuring about 5.1 cm was noted on CT scan.  No surrounding inflammation noted.  Not noted on CT scan done in 2020.  This will need outpatient evaluation.  Obesity, class III Estimated body mass index is 41.84 kg/m as calculated from the following:   Height as of this encounter: 5' 11"  (1.803 m).   Weight as of this encounter: 136.1 kg.   DVT Prophylaxis: Lovenox Code Status: Full code Family Communication: Discussed with the patient.  No family at bedside Disposition Plan: Hopefully return home when improved.  Mobilize.  Status is: Inpatient  Remains inpatient appropriate because:IV treatments appropriate due to intensity of illness or inability to take PO and Inpatient level of care appropriate due to severity of illness   Dispo: The patient is from: Home              Anticipated d/c is to: Home              Patient currently is not medically stable to d/c.   Difficult to place patient No        Medications:  Scheduled: . enoxaparin (LOVENOX) injection  60 mg Subcutaneous Q24H  . fentaNYL  1 patch Transdermal Q72H  . metoprolol tartrate  5 mg Intravenous Q6H  . pantoprazole (PROTONIX) IV  40 mg Intravenous QHS   Continuous: . 0.9 % NaCl  with KCl 20 mEq / L 100 mL/hr at 10/15/20 0454  . promethazine (PHENERGAN) injection 12.5 mg (10/15/20 3299)   MEQ:[id number], hydrALAZINE, morphine injection, ondansetron (ZOFRAN) IV, promethazine (PHENERGAN) injection   Objective:  Vital Signs  Vitals:   10/14/20 0523 10/14/20 1246 10/14/20 2058 10/15/20 0532  BP: (!) 158/78 140/75 (!) 144/79 140/81  Pulse: (!) 56 65 63 66  Resp:  18 18 20   Temp: 98.4 F  (36.9 C) 98.1 F (36.7 C) 98.5 F (36.9 C) 98.2 F (36.8 C)  TempSrc: Oral Oral Oral Oral  SpO2: 97% 98% 100% 98%  Weight:      Height:        Intake/Output Summary (Last 24 hours) at 10/15/2020 0955 Last data filed at 10/15/2020 0600 Gross per 24 hour  Intake 1490 ml  Output 3200 ml  Net -1710 ml   Filed Weights   10/12/20 1609  Weight: 136.1 kg    General appearance: Awake alert.  In no distress Resp: Clear to auscultation bilaterally.  Normal effort Cardio: S1-S2 is normal regular.  No S3-S4.  No rubs murmurs or bruit GI: Abdomen is soft.  Ostomy is noted.  Bowel sounds sluggish.  Nontender.   Extremities: No edema.  Full range of motion of lower extremities. Neurologic: Alert and oriented x3.  No focal neurological deficits.      Lab Results:  Data Reviewed: I have personally reviewed following labs and imaging studies  CBC: Recent Labs  Lab 10/12/20 1629 10/13/20 0350 10/14/20 0428  WBC 8.0 8.9 7.4  NEUTROABS 6.2 6.7 4.8  HGB 14.3 13.6 12.0  HCT 43.9 42.2 38.4  MCV 84.6 86.3 88.5  PLT 291 299 211    Basic Metabolic Panel: Recent Labs  Lab 10/12/20 1629 10/13/20 0350 10/14/20 0428 10/15/20 0339  NA 137 136 137 136  K 3.3* 3.5 4.5 4.0  CL 98 105 108 104  CO2 24 20* 23 23  GLUCOSE 137* 126* 82 80  BUN 15 13 10 8   CREATININE 0.87 0.78 0.71 0.65  CALCIUM 10.3 9.3 9.1 9.4  MG 1.8 2.1  --   --   PHOS 2.1* 3.8  --   --     GFR: Estimated Creatinine Clearance: 124.4 mL/min (by C-G formula based on SCr of 0.65 mg/dL).  Liver Function Tests: Recent Labs  Lab 10/12/20 1629  AST 23  ALT 27  ALKPHOS 73  BILITOT 1.0  PROT 8.1  ALBUMIN 4.6    Recent Labs  Lab 10/12/20 1629  LIPASE 28     Recent Results (from the past 240 hour(s))  Resp Panel by RT-PCR (Flu A&B, Covid) Nasopharyngeal Swab     Status: None   Collection Time: 10/12/20 10:40 PM   Specimen: Nasopharyngeal Swab; Nasopharyngeal(NP) swabs in vial transport medium  Result Value  Ref Range Status   SARS Coronavirus 2 by RT PCR NEGATIVE NEGATIVE Final    Comment: (NOTE) SARS-CoV-2 target nucleic acids are NOT DETECTED.  The SARS-CoV-2 RNA is generally detectable in upper respiratory specimens during the acute phase of infection. The lowest concentration of SARS-CoV-2 viral copies this assay can detect is 138 copies/mL. A negative result does not preclude SARS-Cov-2 infection and should not be used as the sole basis for treatment or other patient management decisions. A negative result may occur with  improper specimen collection/handling, submission of specimen other than nasopharyngeal swab, presence of viral mutation(s) within the areas targeted by this assay, and inadequate number of viral copies(<138 copies/mL). A  negative result must be combined with clinical observations, patient history, and epidemiological information. The expected result is Negative.  Fact Sheet for Patients:  EntrepreneurPulse.com.au  Fact Sheet for Healthcare Providers:  IncredibleEmployment.be  This test is no t yet approved or cleared by the [place] FDA and  has been authorized for detection and/or diagnosis of SARS-CoV-2 by FDA under an Emergency Use Authorization (EUA). This EUA will remain  in effect (meaning this test can be used) for the duration of the COVID-19 declaration under Section 564(b)(1) of the Act, 21 U.S.C.section 360bbb-3(b)(1), unless the authorization is terminated  or revoked sooner.       Influenza A by PCR NEGATIVE NEGATIVE Final   Influenza B by PCR NEGATIVE NEGATIVE Final    Comment: (NOTE) The Xpert Xpress SARS-CoV-2/FLU/RSV plus assay is intended as an aid in the diagnosis of influenza from Nasopharyngeal swab specimens and should not be used as a sole basis for treatment. Nasal washings and aspirates are unacceptable for Xpert Xpress SARS-CoV-2/FLU/RSV testing.  Fact Sheet for  Patients: EntrepreneurPulse.com.au  Fact Sheet for Healthcare Providers: IncredibleEmployment.be  This test is not yet approved or cleared by the [place] FDA and has been authorized for detection and/or diagnosis of SARS-CoV-2 by FDA under an Emergency Use Authorization (EUA). This EUA will remain in effect (meaning this test can be used) for the duration of the COVID-19 declaration under Section 564(b)(1) of the Act, 21 U.S.C. section 360bbb-3(b)(1), unless the authorization is terminated or revoked.  Performed at [facility]       Radiology Studies: DG Abd 1 View  Result Date: 10/14/2020 CLINICAL DATA:  Nausea and vomiting 1 week with generalized abdominal pain. Previous bowel resection with colostomy 2017. EXAM: ABDOMEN - 1 VIEW COMPARISON:  05/08/2016 and CT 10/12/2020 FINDINGS: Exam demonstrates several dilated air-filled small bowel loops in the central abdomen as seen on recent CT suggesting early/partial small bowel obstruction. Air and contrast are present within the colon. No free peritoneal air. Fusion hardware is present over the lower lumbar spine. Remainder of the exam is unremarkable. IMPRESSION: Continued evidence of dilated air-filled small bowel loops in the central abdomen suggesting early/partial small bowel obstruction. Electronically Signed   By: Marin Olp M.D.   On: 10/14/2020 13:22       LOS: 2 days   [facility] Pager on [email or web address]  10/15/2020, 9:55 AM

## 2020-10-16 LAB — BASIC METABOLIC PANEL
Anion gap: 10 (ref 5–15)
BUN: 8 mg/dL (ref 6–20)
CO2: 22 mmol/L (ref 22–32)
Calcium: 9.2 mg/dL (ref 8.9–10.3)
Chloride: 104 mmol/L (ref 98–111)
Creatinine, Ser: 0.7 mg/dL (ref 0.44–1.00)
GFR, Estimated: >60 mL/min (ref >60)
Glucose, Bld: 74 mg/dL (ref 70–99)
Potassium: 3.9 mmol/L (ref 3.5–5.1)
Sodium: 136 mmol/L (ref 135–145)

## 2020-10-16 MED ORDER — POTASSIUM CHLORIDE 10 MEQ/100ML IV SOLN
10.0000 meq | INTRAVENOUS | Status: AC
  Administered 2020-10-16 (×2): 10 meq via INTRAVENOUS
  Filled 2020-10-16 (×2): qty 100

## 2020-10-16 NOTE — Plan of Care (Signed)
  Problem: Education: Goal: Knowledge of General Education information will improve Description: Including pain rating scale, medication(s)/side effects and non-pharmacologic comfort measures Outcome: Progressing   Problem: Health Behavior/Discharge Planning: Goal: Ability to manage health-related needs will improve Outcome: Progressing   Problem: Clinical Measurements: Goal: Ability to maintain clinical measurements within normal limits will improve Outcome: Progressing Goal: Will remain free from infection Outcome: Progressing Goal: Diagnostic test results will improve Outcome: Progressing Goal: Respiratory complications will improve Outcome: Progressing Goal: Cardiovascular complication will be avoided Outcome: Progressing   Problem: Activity: Goal: Risk for activity intolerance will decrease Outcome: Progressing   Problem: Nutrition: Goal: Adequate nutrition will be maintained Outcome: Progressing   Problem: Elimination: Goal: Will not experience complications related to urinary retention Outcome: Progressing   Problem: Pain Managment: Goal: General experience of comfort will improve Outcome: Progressing   Problem: Safety: Goal: Ability to remain free from injury will improve Outcome: Progressing   Problem: Skin Integrity: Goal: Risk for impaired skin integrity will decrease Outcome: Progressing

## 2020-10-16 NOTE — Progress Notes (Signed)
[facility] PROGRESS NOTE   Lindsay Little:[id number] DOB: March 23, 1966 DOA: 10/12/2020  PCP: [name], MD  Brief History/Interval Summary: 55 y.o. female with medical history significant for rectal cancer s/p resection complicated by rectovaginal fistula and intra-abdominal abscesses, colostomy in place, lumbar discitis with vertebral osteomyelitis, hypertension, hyperlipidemia, chronic pain syndrome who presented to the ED for evaluation of abdominal pain and vomiting.  Patient found to have evidence for small bowel obstruction on CT scan.  Was hospitalized for further management.  General surgery was consulted.  Patient declined NG tube.  Consultants: General surgery  Procedures: None  Antibiotics: Anti-infectives (From admission, onward)   None      Subjective/Interval History: Patient mentions some nausea this morning but no vomiting yet.  Overall she feels better.  Tolerated her clear liquids yesterday.  Passing gas through her ostomy.  Little bit of stool as well yesterday.  Has been ambulating.   Assessment/Plan:  Partial small bowel obstruction in the setting of history of rectal cancer status post resection and presence of colostomy Patient last surgery was more than 2 years ago.  She mentions that she has never been hospitalized for similar problem in the past.  She has been very hesitant to have an NG tube placed.   Seems to be stable.  Tolerating clear liquids.  Further management per general surgery.  Essential hypertension Oral medicines on hold due to the small bowel obstruction.  She has been placed on scheduled IV metoprolol.  Blood pressures reasonably well controlled.  Occasional high readings noted.  Hydralazine as needed as well.  Hypokalemia and hypophosphatemia Potassium level has improved.  Phosphorus level had also improved.      History of lumbar discitis with vertebral osteomyelitis No longer on any antibiotics.  Followed by Dr.  [name] with ID.  Chronic pain syndrome Uses duragesic patch and keppra at home. Follows with pain med specialist who is trying to get her off some of these medications. Does not use Keppra for seizures. Duragesic patch to be continued. Resume Keppra when she is able to take orally.  Hyperlipidemia Statin is on hold.  Left adnexal fluid density Enough multiloculated fluid density in the left lower abdomen/adnexa measuring about 5.1 cm was noted on CT scan.  No surrounding inflammation noted.  Not noted on CT scan done in 2020.  This will need outpatient evaluation.  Obesity, class III Estimated body mass index is 41.84 kg/m as calculated from the following:   Height as of this encounter: 5' 11"  (1.803 m).   Weight as of this encounter: 136.1 kg.   DVT Prophylaxis: Lovenox Code Status: Full code Family Communication: Discussed with the patient.  No family at bedside Disposition Plan: Hopefully return home when improved.  Mobilize.  Status is: Inpatient  Remains inpatient appropriate because:IV treatments appropriate due to intensity of illness or inability to take PO and Inpatient level of care appropriate due to severity of illness   Dispo: The patient is from: Home              Anticipated d/c is to: Home              Patient currently is not medically stable to d/c.   Difficult to place patient No      Medications:  Scheduled: . enoxaparin (LOVENOX) injection  60 mg Subcutaneous Q24H  . fentaNYL  1 patch Transdermal Q72H  . metoprolol tartrate  5 mg Intravenous Q6H  . pantoprazole (PROTONIX) IV  40 mg Intravenous QHS   Continuous: . 0.9 % NaCl with KCl 20 mEq / L 75 mL/hr at 10/16/20 0531  . promethazine (PHENERGAN) injection 12.5 mg (10/15/20 1660)   YTK:[id number], hydrALAZINE, morphine injection, ondansetron (ZOFRAN) IV, promethazine (PHENERGAN) injection   Objective:  Vital Signs  Vitals:   10/15/20 0532 10/15/20 1211 10/15/20 2027 10/16/20 0532   BP: 140/81 131/71 131/86 (!) 143/86  Pulse: 66 78 (!) 58 70  Resp: 20 19 20 20   Temp: 98.2 F (36.8 C) 97.6 F (36.4 C) 98.5 F (36.9 C) 98.1 F (36.7 C)  TempSrc: Oral Oral Oral Oral  SpO2: 98% 97% 100% 96%  Weight:      Height:        Intake/Output Summary (Last 24 hours) at 10/16/2020 0900 Last data filed at 10/16/2020 0200 Gross per 24 hour  Intake 1809.06 ml  Output 200 ml  Net 1609.06 ml   Filed Weights   10/12/20 1609  Weight: 136.1 kg    General appearance: Awake alert.  In no distress Resp: Clear to auscultation bilaterally.  Normal effort Cardio: S1-S2 is normal regular.  No S3-S4.  No rubs murmurs or bruit GI: Abdomen is soft.  Ostomy is noted.  Bowel sounds sluggish but present.  Nontender.  Extremities: No edema.  Full range of motion of lower extremities. Neurologic: Alert and oriented x3.  No focal neurological deficits.     Lab Results:  Data Reviewed: I have personally reviewed following labs and imaging studies  CBC: Recent Labs  Lab 10/12/20 1629 10/13/20 0350 10/14/20 0428  WBC 8.0 8.9 7.4  NEUTROABS 6.2 6.7 4.8  HGB 14.3 13.6 12.0  HCT 43.9 42.2 38.4  MCV 84.6 86.3 88.5  PLT 291 299 025    Basic Metabolic Panel: Recent Labs  Lab 10/12/20 1629 10/13/20 0350 10/14/20 0428 10/15/20 0339 10/16/20 0333  NA 137 136 137 136 136  K 3.3* 3.5 4.5 4.0 3.9  CL 98 105 108 104 104  CO2 24 20* 23 23 22   GLUCOSE 137* 126* 82 80 74  BUN 15 13 10 8 8   CREATININE 0.87 0.78 0.71 0.65 0.70  CALCIUM 10.3 9.3 9.1 9.4 9.2  MG 1.8 2.1  --   --   --   PHOS 2.1* 3.8  --   --   --     GFR: Estimated Creatinine Clearance: 124.4 mL/min (by C-G formula based on SCr of 0.7 mg/dL).  Liver Function Tests: Recent Labs  Lab 10/12/20 1629  AST 23  ALT 27  ALKPHOS 73  BILITOT 1.0  PROT 8.1  ALBUMIN 4.6    Recent Labs  Lab 10/12/20 1629  LIPASE 28     Recent Results (from the past 240 hour(s))  Resp Panel by RT-PCR (Flu A&B, Covid)  Nasopharyngeal Swab     Status: None   Collection Time: 10/12/20 10:40 PM   Specimen: Nasopharyngeal Swab; Nasopharyngeal(NP) swabs in vial transport medium  Result Value Ref Range Status   SARS Coronavirus 2 by RT PCR NEGATIVE NEGATIVE Final    Comment: (NOTE) SARS-CoV-2 target nucleic acids are NOT DETECTED.  The SARS-CoV-2 RNA is generally detectable in upper respiratory specimens during the acute phase of infection. The lowest concentration of SARS-CoV-2 viral copies this assay can detect is 138 copies/mL. A negative result does not preclude SARS-Cov-2 infection and should not be used as the sole basis for treatment or other patient management decisions. A negative result may occur with  improper specimen collection/handling,  submission of specimen other than nasopharyngeal swab, presence of viral mutation(s) within the areas targeted by this assay, and inadequate number of viral copies(<138 copies/mL). A negative result must be combined with clinical observations, patient history, and epidemiological information. The expected result is Negative.  Fact Sheet for Patients:  EntrepreneurPulse.com.au  Fact Sheet for Healthcare Providers:  IncredibleEmployment.be  This test is no t yet approved or cleared by the [place] FDA and  has been authorized for detection and/or diagnosis of SARS-CoV-2 by FDA under an Emergency Use Authorization (EUA). This EUA will remain  in effect (meaning this test can be used) for the duration of the COVID-19 declaration under Section 564(b)(1) of the Act, 21 U.S.C.section 360bbb-3(b)(1), unless the authorization is terminated  or revoked sooner.       Influenza A by PCR NEGATIVE NEGATIVE Final   Influenza B by PCR NEGATIVE NEGATIVE Final    Comment: (NOTE) The Xpert Xpress SARS-CoV-2/FLU/RSV plus assay is intended as an aid in the diagnosis of influenza from Nasopharyngeal swab specimens and should not be  used as a sole basis for treatment. Nasal washings and aspirates are unacceptable for Xpert Xpress SARS-CoV-2/FLU/RSV testing.  Fact Sheet for Patients: EntrepreneurPulse.com.au  Fact Sheet for Healthcare Providers: IncredibleEmployment.be  This test is not yet approved or cleared by the [place] FDA and has been authorized for detection and/or diagnosis of SARS-CoV-2 by FDA under an Emergency Use Authorization (EUA). This EUA will remain in effect (meaning this test can be used) for the duration of the COVID-19 declaration under Section 564(b)(1) of the Act, 21 U.S.C. section 360bbb-3(b)(1), unless the authorization is terminated or revoked.  Performed at [facility]       Radiology Studies: DG Abd 1 View  Result Date: 10/14/2020 CLINICAL DATA:  Nausea and vomiting 1 week with generalized abdominal pain. Previous bowel resection with colostomy 2017. EXAM: ABDOMEN - 1 VIEW COMPARISON:  05/08/2016 and CT 10/12/2020 FINDINGS: Exam demonstrates several dilated air-filled small bowel loops in the central abdomen as seen on recent CT suggesting early/partial small bowel obstruction. Air and contrast are present within the colon. No free peritoneal air. Fusion hardware is present over the lower lumbar spine. Remainder of the exam is unremarkable. IMPRESSION: Continued evidence of dilated air-filled small bowel loops in the central abdomen suggesting early/partial small bowel obstruction. Electronically Signed   By: Marin Olp M.D.   On: 10/14/2020 13:22       LOS: 3 days   [facility] Pager on [email or web address]  10/16/2020, 9:00 AM

## 2020-10-16 NOTE — Progress Notes (Signed)
[facility] Progress Note     Subjective: CC-  Started on CLD yesterday and tolerating. States that she is starting to feel hungry but was afraid to take much in. Denies n/v or abdominal bloating. She had large volume stool from ostomy yesterday morning, small amount since then but she has had to burp her ostomy appliance 2-3 times for flatus.   Objective: Vital signs in last 24 hours: Temp:  [97.6 F (36.4 C)-98.5 F (36.9 C)] 98.1 F (36.7 C) (04/04 0532) Pulse Rate:  [58-78] 70 (04/04 0532) Resp:  [19-20] 20 (04/04 0532) BP: (131-143)/(71-86) 143/86 (04/04 0532) SpO2:  [96 %-100 %] 96 % (04/04 0532) Last BM Date: 10/16/20 (colostomy)  Intake/Output from previous day: 04/03 0701 - 04/04 0700 In: 1809.1 [P.O.:240; I.V.:1569.1] Out: 200 [Urine:200] Intake/Output this shift: No intake/output data recorded.  PE: Gen:  Alert, NAD, pleasant Pulm:  rate and effort normal Abd: obese, soft, mild epigastric discomfort without rebound or guarding, LLQ ostomy with pouch in place and small amount of stool and air  Psych: A&Ox4  Skin: no rashes noted, warm and dry  Lab Results:  Recent Labs    10/14/20 0428  WBC 7.4  HGB 12.0  HCT 38.4  PLT 229   BMET Recent Labs    10/15/20 0339 10/16/20 0333  NA 136 136  K 4.0 3.9  CL 104 104  CO2 23 22  GLUCOSE 80 74  BUN 8 8  CREATININE 0.65 0.70  CALCIUM 9.4 9.2   PT/INR No results for input(s): LABPROT, INR in the last 72 hours. CMP     Component Value Date/Time   NA 136 10/16/2020 0333   NA 140 05/30/2017 1009   K 3.9 10/16/2020 0333   K 4.3 05/30/2017 1009   CL 104 10/16/2020 0333   CO2 22 10/16/2020 0333   CO2 25 05/30/2017 1009   GLUCOSE 74 10/16/2020 0333   GLUCOSE 97 05/30/2017 1009   BUN 8 10/16/2020 0333   BUN 17.7 05/30/2017 1009   CREATININE 0.70 10/16/2020 0333   CREATININE 0.71 07/01/2019 1339   CREATININE 0.7 05/30/2017 1009   CALCIUM 9.2 10/16/2020 0333   CALCIUM 9.7 05/30/2017 1009    PROT 8.1 10/12/2020 1629   PROT 7.2 05/30/2017 1009   ALBUMIN 4.6 10/12/2020 1629   ALBUMIN 3.8 05/30/2017 1009   AST 23 10/12/2020 1629   AST 14 (L) 07/01/2019 1339   AST 16 05/30/2017 1009   ALT 27 10/12/2020 1629   ALT 12 07/01/2019 1339   ALT 22 05/30/2017 1009   ALKPHOS 73 10/12/2020 1629   ALKPHOS 89 05/30/2017 1009   BILITOT 1.0 10/12/2020 1629   BILITOT 0.3 07/01/2019 1339   BILITOT 0.52 05/30/2017 1009   GFRNONAA >60 10/16/2020 0333   GFRNONAA >60 07/01/2019 1339   GFRAA >60 08/25/2019 1326   GFRAA >60 07/01/2019 1339   Lipase     Component Value Date/Time   LIPASE 28 10/12/2020 1629       Studies/Results: DG Abd 1 View  Result Date: 10/14/2020 CLINICAL DATA:  Nausea and vomiting 1 week with generalized abdominal pain. Previous bowel resection with colostomy 2017. EXAM: ABDOMEN - 1 VIEW COMPARISON:  05/08/2016 and CT 10/12/2020 FINDINGS: Exam demonstrates several dilated air-filled small bowel loops in the central abdomen as seen on recent CT suggesting early/partial small bowel obstruction. Air and contrast are present within the colon. No free peritoneal air. Fusion hardware is present over the lower lumbar spine. Remainder of the exam  is unremarkable. IMPRESSION: Continued evidence of dilated air-filled small bowel loops in the central abdomen suggesting early/partial small bowel obstruction. Electronically Signed   By: Marin Olp M.D.   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 Advance to full liquid diet. Since she has waxed/waned since admission would continue FLD x24 hours before considering advancement. Mobilize.    LOS: 3 days    Wellington Hampshire, [facility] 10/16/2020, 9:09 AM Please see Amion for pager number during day hours 7:00am-4:30pm

## 2020-10-17 LAB — BASIC METABOLIC PANEL
Anion gap: 11 (ref 5–15)
BUN: 6 mg/dL (ref 6–20)
CO2: 24 mmol/L (ref 22–32)
Calcium: 9.9 mg/dL (ref 8.9–10.3)
Chloride: 106 mmol/L (ref 98–111)
Creatinine, Ser: 0.69 mg/dL (ref 0.44–1.00)
GFR, Estimated: >60 mL/min (ref >60)
Glucose, Bld: 92 mg/dL (ref 70–99)
Potassium: 4.1 mmol/L (ref 3.5–5.1)
Sodium: 141 mmol/L (ref 135–145)

## 2020-10-17 NOTE — Plan of Care (Signed)
  Problem: Health Behavior/Discharge Planning: Goal: Ability to manage health-related needs will improve Outcome: Progressing   Problem: Nutrition: Goal: Adequate nutrition will be maintained Outcome: Progressing   Problem: Activity: Goal: Risk for activity intolerance will decrease Outcome: Progressing   Problem: Safety: Goal: Ability to remain free from injury will improve Outcome: Progressing

## 2020-10-17 NOTE — Progress Notes (Signed)
Discharge instructions reviewed with pt. All questions answered. IV taken out. Pt waiting for ride.

## 2020-10-17 NOTE — Progress Notes (Signed)
[facility] Progress Note     Subjective: CC-  Feeling much better. Denies abdominal pain, nausea, vomiting. Tolerating full liquids. Having small amount of stool from ostomy, but she reports increased flatus. She has had to burp her ostomy appliance 7-8x since yesterday morning.   Objective: Vital signs in last 24 hours: Temp:  [98.2 F (36.8 C)-99.3 F (37.4 C)] 98.4 F (36.9 C) (04/05 0551) Pulse Rate:  [69-86] 82 (04/05 0551) Resp:  [12-18] 18 (04/05 0551) BP: (138-153)/(63-94) 146/84 (04/05 0551) SpO2:  [93 %-100 %] 93 % (04/05 0551) Last BM Date: 10/17/20 (colostomy)  Intake/Output from previous day: 04/04 0701 - 04/05 0700 In: 2100.7 [P.O.:120; I.V.:1794.2; IV Piggyback:186.5] Out: 1900 [Urine:1900] Intake/Output this shift: No intake/output data recorded.  PE: Gen:  Alert, NAD, pleasant Pulm:  rate and effort normal Abd: obese, soft, nontender, LLQ ostomy with pouch in place and small amount of stool and air  Psych: A&Ox4  Skin: no rashes noted, warm and dry  Lab Results:  No results for input(s): WBC, HGB, HCT, PLT in the last 72 hours. BMET Recent Labs    10/15/20 0339 10/16/20 0333  NA 136 136  K 4.0 3.9  CL 104 104  CO2 23 22  GLUCOSE 80 74  BUN 8 8  CREATININE 0.65 0.70  CALCIUM 9.4 9.2   PT/INR No results for input(s): LABPROT, INR in the last 72 hours. CMP     Component Value Date/Time   NA 136 10/16/2020 0333   NA 140 05/30/2017 1009   K 3.9 10/16/2020 0333   K 4.3 05/30/2017 1009   CL 104 10/16/2020 0333   CO2 22 10/16/2020 0333   CO2 25 05/30/2017 1009   GLUCOSE 74 10/16/2020 0333   GLUCOSE 97 05/30/2017 1009   BUN 8 10/16/2020 0333   BUN 17.7 05/30/2017 1009   CREATININE 0.70 10/16/2020 0333   CREATININE 0.71 07/01/2019 1339   CREATININE 0.7 05/30/2017 1009   CALCIUM 9.2 10/16/2020 0333   CALCIUM 9.7 05/30/2017 1009   PROT 8.1 10/12/2020 1629   PROT 7.2 05/30/2017 1009   ALBUMIN 4.6 10/12/2020 1629   ALBUMIN 3.8  05/30/2017 1009   AST 23 10/12/2020 1629   AST 14 (L) 07/01/2019 1339   AST 16 05/30/2017 1009   ALT 27 10/12/2020 1629   ALT 12 07/01/2019 1339   ALT 22 05/30/2017 1009   ALKPHOS 73 10/12/2020 1629   ALKPHOS 89 05/30/2017 1009   BILITOT 1.0 10/12/2020 1629   BILITOT 0.3 07/01/2019 1339   BILITOT 0.52 05/30/2017 1009   GFRNONAA >60 10/16/2020 0333   GFRNONAA >60 07/01/2019 1339   GFRAA >60 08/25/2019 1326   GFRAA >60 07/01/2019 1339   Lipase     Component Value Date/Time   LIPASE 28 10/12/2020 1629       Studies/Results: No results found.  Anti-infectives: Anti-infectives (From admission, onward)   None       Assessment/Plan HTN HLD Hx lumbar discitis Chronic pain - on fentanyl patch and keppra Obesity BMI 41.84  Partial SBO - complex abdominal surgical history and APR anatomy: Hx rectal ca, cXRT 2016, robotic LAR/DLI; DLI takedown followed by leak + colovaginal fistula; ultimately resected anastomosis --> end colostomy; Ano- + vagino-peritoneal fistulas --> Acell; Parastomal hernia --> sugarbaker + 35 x 27 cm mesh - CT 3/31 transition in the right lower quadrant, though discrete transition point is not seen - seemed to be improving on admission and never had NG tube - Xray 4/2  showed evidence of dilated air-filled small bowel loops in the central abdomen suggesting early/partial small bowel obstruction  ID - none FEN - soft diet VTE - lovenox Foley - none Follow up - TBD  Plan: Abdominal pain resolved, no n/v, tolerating full liquids, and bowel function returning. Advance to soft diet. [place] for discharge from surgical standpoint.   LOS: 4 days    [facility] 10/17/2020, 9:56 AM Please see Amion for pager number during day hours 7:00am-4:30pm

## 2020-10-17 NOTE — Discharge Summary (Signed)
[facility]  Physician Discharge Summary   [name] MRN: [id number] DOB/AGE: 19-Apr-1966 55 y.o.  Admit date: 10/12/2020 Discharge date: 10/17/2020  PCP: [name], MD  DISCHARGE DIAGNOSES:  Partial small bowel obstruction, resolved Essential hypertension History of lumbar discitis with vertebral osteomyelitis Chronic pain syndrome Left adnexal fluid density requiring outpatient evaluation  RECOMMENDATIONS FOR OUTPATIENT FOLLOW UP: 1. She needs a pelvic ultrasound in the near future to evaluate the left adnexal lesion    Home Health: None Equipment/Devices: None  CODE STATUS: Full code  DISCHARGE CONDITION: fair  Diet recommendation: As instructed by general surgery  INITIAL HISTORY: 55 y.o.femalewith medical history significant forrectal cancer s/p resection complicated by rectovaginal fistula and intra-abdominal abscesses, colostomy in place, lumbar discitis with vertebral osteomyelitis, hypertension, hyperlipidemia, chronic pain syndrome who presented to the ED for evaluation of abdominal pain and vomiting.  Patient found to have evidence for small bowel obstruction on CT scan.  Was hospitalized for further management.  General surgery was consulted.  Patient declined NG tube.  Consultations:  General surgery   HOSPITAL COURSE:   Partial small bowel obstruction in the setting of history of rectal cancer status post resection and presence of colostomy Patient last surgery was more than 2 years ago.  She mentions that she has never been hospitalized for similar problem in the past.    Patient was hesitant to have the NG tube placed.  She was managed conservatively.  General surgery was consulted.  Diet was slowly advanced.  She has noted gas in her ostomy.  She has also noticed some stools as well.  Tolerating diet.  Has been ambulating.  Wants to go home today.  Discussed with general surgery.  They have cleared her for discharge.     Essential hypertension May resume home medications.    Hypokalemia and hypophosphatemia These were corrected in the hospital.  History of lumbar discitis with vertebral osteomyelitis No longer on any antibiotics.  Followed by Dr. [name] with ID.  Chronic pain syndrome Uses duragesic patch and keppra at home. Follows with pain med specialist who is trying to get her off some of these medications. Does not use Keppra for seizures.  Resume medications.  Hyperlipidemia May resume home medication  Left adnexal fluid density Enough multiloculated fluid density in the left lower abdomen/adnexa measuring about 5.1 cm was noted on CT scan.  No surrounding inflammation noted.  Not noted on CT scan done in 2020.  This will need outpatient evaluation.  This was discussed with the patient today.  Apparently her right ovary was removed during her cancer surgery in 2016.  She will follow-up with her PCP and discuss further management of this lesion noted incidentally.  Obesity, class III Estimated body mass index is 41.84 kg/m as calculated from the following:   Height as of this encounter: 5' 11"  (1.803 m).   Weight as of this encounter: 136.1 kg.   Patient is stable.  Okay for discharge home today.   PERTINENT LABS:  The results of significant diagnostics from this hospitalization (including imaging, microbiology, ancillary and laboratory) are listed below for reference.    Microbiology: Recent Results (from the past 240 hour(s))  Resp Panel by RT-PCR (Flu A&B, Covid) Nasopharyngeal Swab     Status: None   Collection Time: 10/12/20 10:40 PM   Specimen: Nasopharyngeal Swab; Nasopharyngeal(NP) swabs in vial transport medium  Result Value Ref Range Status   SARS Coronavirus 2 by RT PCR NEGATIVE NEGATIVE Final  Comment: (NOTE) SARS-CoV-2 target nucleic acids are NOT DETECTED.  The SARS-CoV-2 RNA is generally detectable in upper respiratory specimens during the acute phase of  infection. The lowest concentration of SARS-CoV-2 viral copies this assay can detect is 138 copies/mL. A negative result does not preclude SARS-Cov-2 infection and should not be used as the sole basis for treatment or other patient management decisions. A negative result may occur with  improper specimen collection/handling, submission of specimen other than nasopharyngeal swab, presence of viral mutation(s) within the areas targeted by this assay, and inadequate number of viral copies(<138 copies/mL). A negative result must be combined with clinical observations, patient history, and epidemiological information. The expected result is Negative.  Fact Sheet for Patients:  EntrepreneurPulse.com.au  Fact Sheet for Healthcare Providers:  IncredibleEmployment.be  This test is no t yet approved or cleared by the [place] FDA and  has been authorized for detection and/or diagnosis of SARS-CoV-2 by FDA under an Emergency Use Authorization (EUA). This EUA will remain  in effect (meaning this test can be used) for the duration of the COVID-19 declaration under Section 564(b)(1) of the Act, 21 U.S.C.section 360bbb-3(b)(1), unless the authorization is terminated  or revoked sooner.       Influenza A by PCR NEGATIVE NEGATIVE Final   Influenza B by PCR NEGATIVE NEGATIVE Final    Comment: (NOTE) The Xpert Xpress SARS-CoV-2/FLU/RSV plus assay is intended as an aid in the diagnosis of influenza from Nasopharyngeal swab specimens and should not be used as a sole basis for treatment. Nasal washings and aspirates are unacceptable for Xpert Xpress SARS-CoV-2/FLU/RSV testing.  Fact Sheet for Patients: EntrepreneurPulse.com.au  Fact Sheet for Healthcare Providers: IncredibleEmployment.be  This test is not yet approved or cleared by the [place] FDA and has been authorized for detection and/or diagnosis of SARS-CoV-2  by FDA under an Emergency Use Authorization (EUA). This EUA will remain in effect (meaning this test can be used) for the duration of the COVID-19 declaration under Section 564(b)(1) of the Act, 21 U.S.C. section 360bbb-3(b)(1), unless the authorization is terminated or revoked.  Performed at [facility]      Labs:  [place]    Lab Results  Component Value Date   [place] 10/12/2020   [place] NEGATIVE 07/15/2019      Basic Metabolic Panel: Recent Labs  Lab 10/12/20 1629 10/13/20 0350 10/14/20 0428 10/15/20 0339 10/16/20 0333 10/17/20 0927  NA 137 136 137 136 136 141  K 3.3* 3.5 4.5 4.0 3.9 4.1  CL 98 105 108 104 104 106  CO2 24 20* 23 23 22 24   GLUCOSE 137* 126* 82 80 74 92  BUN 15 13 10 8 8 6   CREATININE 0.87 0.78 0.71 0.65 0.70 0.69  CALCIUM 10.3 9.3 9.1 9.4 9.2 9.9  MG 1.8 2.1  --   --   --   --   PHOS 2.1* 3.8  --   --   --   --    Liver Function Tests: Recent Labs  Lab 10/12/20 1629  AST 23  ALT 27  ALKPHOS 73  BILITOT 1.0  PROT 8.1  ALBUMIN 4.6   Recent Labs  Lab 10/12/20 1629  LIPASE 28   CBC: Recent Labs  Lab 10/12/20 1629 10/13/20 0350 10/14/20 0428  WBC 8.0 8.9 7.4  NEUTROABS 6.2 6.7 4.8  HGB 14.3 13.6 12.0  HCT 43.9 42.2 38.4  MCV 84.6 86.3 88.5  PLT 291 299 229    IMAGING STUDIES  DG Abd 1 View  Result Date: 10/14/2020 CLINICAL DATA:  Nausea and vomiting 1 week with generalized abdominal pain. Previous bowel resection with colostomy 2017. EXAM: ABDOMEN - 1 VIEW COMPARISON:  05/08/2016 and CT 10/12/2020 FINDINGS: Exam demonstrates several dilated air-filled small bowel loops in the central abdomen as seen on recent CT suggesting early/partial small bowel obstruction. Air and contrast are present within the colon. No free peritoneal air. Fusion hardware is present over the lower lumbar spine. Remainder of the exam is unremarkable. IMPRESSION: Continued evidence of dilated air-filled small  bowel loops in the central abdomen suggesting early/partial small bowel obstruction. Electronically Signed   By: Marin Olp M.D.   On: 10/14/2020 13:22   CT ABDOMEN PELVIS W CONTRAST  Result Date: 10/12/2020 CLINICAL DATA:  Nausea and vomiting. EXAM: CT ABDOMEN AND PELVIS WITH CONTRAST TECHNIQUE: Multidetector CT imaging of the abdomen and pelvis was performed using the standard protocol following bolus administration of intravenous contrast. CONTRAST:  178m OMNIPAQUE IOHEXOL 300 MG/ML  SOLN IV contrast extravasation of 86 cc Omnipaque in the right arm. Examination is essentially a noncontrast exam, with only minimal intravascular contrast. COMPARISON:  CT 06/18/2019 FINDINGS: Lower chest: No acute airspace disease or pleural effusion. Hepatobiliary: Diffusely decreased hepatic density typical of steatosis. No focal hepatic abnormality. There is minimal layering hyperdensity in the gallbladder that may represent sludge or stones. No pericholecystic fat stranding. No biliary dilatation. Pancreas: Parenchymal atrophy. No ductal dilatation or inflammation. Spleen: Tiny central splenic hypodensities unchanged, nonspecific. Normal in size. Adrenals/Urinary Tract: Normal adrenal glands. No hydronephrosis or perinephric edema. No renal calculi. Urinary bladder is partially distended. Stomach/Bowel: Stomach distended with ingested contents/fluid. Small bowel are dilated and fluid-filled. There is fecalization of dilated small bowel contents in the pelvis. There is transition from dilated to nondilated in the right lower quadrant, though discrete transition point is not seen. No pneumatosis. Enteric sutures noted in the distal ileum. Formed stool in the ascending and transverse colon. Left mid abdominal colostomy. No significant peristomal hernia. Prior abdominal perineal resection of the distal colon. Stable presacral soft tissue density from prior exam. Vascular/Lymphatic: Abdominal aorta is normal in caliber. No  aortic aneurysm. Retroperitoneal lymph nodes are not enlarged by size criteria. There is no pelvic adenopathy. Reproductive: Hysterectomy. Other: Similar appearance of presacral soft tissue density likely related to treatment. Within the left lower quadrant/adnexa is a elongated multiloculated fluid density structure spanning 5.1 cm in cranial caudal dimension. No surrounding inflammation. No soft tissue components. No free intra-abdominal air. No significant mesenteric edema. Musculoskeletal: Postsurgical change at L4-L5. Lucency about the pedicle screws, more so on the right. The L4 pedicle screw extend to the L3-L4 disc space. Progressive sclerosis at L3-L4 from prior exam, previous osteomyelitis on spine MRI. No acute osseous abnormalities are seen. IMPRESSION: 1. Findings consistent with small bowel obstruction with transition in the right lower quadrant, though discrete transition point is not seen. 2. Post abdominal perineal resection with left mid abdominal colostomy. Stable post treatment related presacral soft tissue density. 3. Multi loculated fluid density structure in the left lower quadrant/adnexa measuring 5.1 cm without surrounding inflammation. This is favored to be sequela of prior surgery possible peritoneal inclusion cyst or lymphocele. Attention at follow-up recommended. 4. Hepatic steatosis. 5. Sequela of L3-L4 discitis osteomyelitis. 6. Minimal layering hyperdensity in the gallbladder may represent sludge or stones. No pericholecystic fat stranding. 7. IV contrast extravasation. Aortic Atherosclerosis (ICD10-I70.0). Electronically Signed   By: MKeith RakeM.D.   On: 10/12/2020 20:24  DISCHARGE EXAMINATION: Vitals:   10/16/20 1719 10/16/20 2025 10/16/20 2346 10/17/20 0551  BP: (!) 150/85 (!) 138/92 (!) 142/63 (!) 146/84  Pulse: 86 84 76 82  Resp:  17 18 18   Temp:  98.6 F (37 C) 99.3 F (37.4 C) 98.4 F (36.9 C)  TempSrc:  Oral Oral Oral  SpO2:  100% 98% 93%  Weight:       Height:       General appearance: Awake alert.  In no distress Resp: Clear to auscultation bilaterally.  Normal effort Cardio: S1-S2 is normal regular.  No S3-S4.  No rubs murmurs or bruit GI: Abdomen is soft.  Ostomy is noted.  Nontender.    DISPOSITION: Home  Discharge Instructions    Call MD for:  difficulty breathing, headache or visual disturbances   Complete by: As directed    Call MD for:  extreme fatigue   Complete by: As directed    Call MD for:  persistant dizziness or light-headedness   Complete by: As directed    Call MD for:  persistant nausea and vomiting   Complete by: As directed    Call MD for:  severe uncontrolled pain   Complete by: As directed    Call MD for:  temperature >100.4   Complete by: As directed    Discharge instructions   Complete by: As directed    Please follow instructions provided by surgery team.  Soft diet for few days.  You were cared for by a hospitalist during your hospital stay. If you have any questions about your discharge medications or the care you received while you were in the hospital after you are discharged, you can call the unit and asked to speak with the hospitalist on call if the hospitalist that took care of you is not available. Once you are discharged, your primary care physician will handle any further medical issues. Please note that NO REFILLS for any discharge medications will be authorized once you are discharged, as it is imperative that you return to your primary care physician (or establish a relationship with a primary care physician if you do not have one) for your aftercare needs so that they can reassess your need for medications and monitor your lab values. If you do not have a primary care physician, you can call [phone] for a physician referral.   Increase activity slowly   Complete by: As directed         Allergies as of 10/17/2020      Reactions   Caine-1 [lidocaine] Swelling, Rash   Eyes swell shut;  includes all caine drugs except marcaine. EMLA cream OK though (?!)   Sulfa Antibiotics Nausea And Vomiting, Rash   Adhesive [tape] Other (See Comments)   Blisters - can use paper tape   Doxycycline Nausea Only   Flagyl [metronidazole] Nausea Only   Iron Nausea Only   Oxycodone Other (See Comments)   NIGHTMARES. (tolerates hydrocodone or tramadol better)   Penicillins Rash   Has patient had a PCN reaction causing immediate rash, facial/tongue/throat swelling, SOB or lightheadedness with hypotension: no Has patient had a PCN reaction causing severe rash involving mucus membranes or skin necrosis: no Has patient had a PCN reaction that required hospitalization no Has patient had a PCN reaction occurring within the last 10 years: no If all of the above answers are "NO", then may proceed with Cephalosporin use.      Medication List    TAKE these medications  bisoprolol 10 MG tablet Commonly known as: ZEBETA Take 10 mg by mouth daily.   cefdinir 300 MG capsule Commonly known as: OMNICEF Take 1 capsule (300 mg total) by mouth 2 (two) times daily.   celecoxib 200 MG capsule Commonly known as: CELEBREX TAKE ONE CAPSULE BY MOUTH TWICE DAILY   CoQ10 200 MG Caps Take 200 mg by mouth at bedtime.   doxycycline 100 MG tablet Commonly known as: VIBRA-TABS Take 1 tablet (100 mg total) by mouth 2 (two) times daily.   DULoxetine 60 MG capsule Commonly known as: CYMBALTA Take 120 mg by mouth at bedtime.   esomeprazole 40 MG capsule Commonly known as: NEXIUM Take 1 capsule (40 mg total) by mouth 2 (two) times daily before a meal.   fentaNYL 100 MCG/HR Commonly known as: DURAGESIC 1 patch every 3 (three) days.   furosemide 20 MG tablet Commonly known as: LASIX Take 20 mg by mouth daily as needed for edema.   gabapentin 800 MG tablet Commonly known as: NEURONTIN Take 800 mg by mouth 3 (three) times daily.   HYDROcodone-acetaminophen 10-325 MG tablet Commonly known as:  NORCO Take 1 tablet by mouth every 8 (eight) hours as needed for severe pain.   levETIRAcetam 500 MG tablet Commonly known as: KEPPRA Take 500 mg by mouth 2 (two) times daily.   melatonin 3 MG Tabs tablet Take 3-6 mg by mouth at bedtime.   multivitamin with minerals Tabs tablet Take 2 tablets by mouth at bedtime.   polyethylene glycol 17 g packet Commonly known as: MIRALAX / GLYCOLAX Take 17 g by mouth daily as needed for moderate constipation.   promethazine 25 MG tablet Commonly known as: PHENERGAN TAKE ONE TABLET BY MOUTH EVERY 6 HOURS AS NEEDED FOR NAUSEA OR VOMITING What changed: See the new instructions.   rosuvastatin 5 MG tablet Commonly known as: CRESTOR Take 5 mg by mouth at bedtime.   triamcinolone lotion 0.1 % Commonly known as: KENALOG Apply 1 application topically as needed (dry skin).   Vitamin D3 125 MCG (5000 UT) Caps Take 5,000 Units by mouth at bedtime.         Follow-up Information    [name], MD. Schedule an appointment as soon as possible for a visit in 1 week(s).   Specialty: Family Medicine Contact information: [address], [place] [address] [phone]        Michael Boston, MD Follow up.   Specialties: General Surgery, Colon and Rectal Surgery Why: as instructed by gen surgery Contact information: [address] [place] [address] [phone]               TOTAL DISCHARGE TIME: 35 minutes  [facility] Pager on [email or web address]  10/17/2020, 11:06 AM

## 2020-10-17 NOTE — Plan of Care (Signed)
  Problem: Education: Goal: Knowledge of General Education information will improve Description: Including pain rating scale, medication(s)/side effects and non-pharmacologic comfort measures Outcome: Progressing   Problem: Health Behavior/Discharge Planning: Goal: Ability to manage health-related needs will improve Outcome: Progressing   Problem: Clinical Measurements: Goal: Ability to maintain clinical measurements within normal limits will improve Outcome: Progressing Goal: Will remain free from infection Outcome: Progressing Goal: Diagnostic test results will improve Outcome: Progressing Goal: Respiratory complications will improve Outcome: Progressing Goal: Cardiovascular complication will be avoided Outcome: Progressing   Problem: Activity: Goal: Risk for activity intolerance will decrease Outcome: Progressing   Problem: Nutrition: Goal: Adequate nutrition will be maintained Outcome: Progressing   Problem: Elimination: Goal: Will not experience complications related to urinary retention Outcome: Progressing   Problem: Pain Managment: Goal: General experience of comfort will improve Outcome: Progressing   Problem: Safety: Goal: Ability to remain free from injury will improve Outcome: Progressing   Problem: Skin Integrity: Goal: Risk for impaired skin integrity will decrease Outcome: Progressing

## 2020-10-17 NOTE — Discharge Instructions (Signed)
Bowel Obstruction A bowel obstruction means that something is blocking the small or large bowel. The bowel is also called the intestine. It is the long tube that connects the stomach to the opening of the butt (anus). When something blocks the bowel, food and fluids cannot pass through like normal. This condition needs to be treated. Treatment depends on the cause of the problem and how bad the problem is. What are the causes? Common causes of this condition include:  Scar tissue (adhesions) from past surgery or from high-energy X-rays (radiation).  Recent surgery in the belly. This affects how food moves in the bowel.  Some diseases, such as: ? Irritation of the lining of the digestive tract (Crohn's disease). ? Irritation of small pouches in the bowel (diverticulitis).  Growths or tumors.  A bulging organ (hernia).  Twisting of the bowel (volvulus).  A foreign body.  Slipping of a part of the bowel into another part (intussusception).   What are the signs or symptoms? Symptoms of this condition include:  Pain in the belly.  Feeling sick to your stomach (nauseous).  Throwing up (vomiting).  Bloating in the belly.  Being unable to pass gas.  Trouble pooping (constipation).  Watery poop (diarrhea).  A lot of belching. How is this diagnosed? This condition may be diagnosed based on:  A physical exam.  Medical history.  Imaging tests, such as X-ray or CT scan.  Blood tests.  Urine tests. How is this treated? Treatment for this condition may include:  Fluids and pain medicines that are given through an IV tube. Your doctor may tell you not to eat or drink if you feel sick to your stomach and are throwing up.  Eating a clear liquid diet for a few days.  Putting a small tube (nasogastric tube) into the stomach. This will help with pain, discomfort, and nausea by removing blocked air and fluids from the stomach.  Surgery. This may be needed if other treatments do  not work. Follow these instructions at home: Medicines  Take over-the-counter and prescription medicines only as told by your doctor.  If you were prescribed an antibiotic medicine, take it as told by your doctor. Do not stop taking the antibiotic even if you start to feel better. General instructions  Follow your diet as told by your doctor. You may need to: ? Only drink clear liquids until you start to get better. ? Avoid solid foods.  Return to your normal activities as told by your doctor. Ask your doctor what activities are safe for you.  Do not sit for a long time without moving. Get up to take short walks every 1-2 hours. This is important. Ask for help if you feel weak or unsteady.  Keep all follow-up visits as told by your doctor. This is important. How is this prevented? After having a bowel obstruction, you may be more likely to have another. You can do some things to stop it from happening again.  If you have a long-term (chronic) disease, contact your doctor if you see changes or problems.  Take steps to prevent or treat trouble pooping. Your doctor may ask that you: ? Drink enough fluid to keep your pee (urine) pale yellow. ? Take over-the-counter or prescription medicines. ? Eat foods that are high in fiber. These include beans, whole grains, and fresh fruits and vegetables. ? Limit foods that are high in fat and sugar. These include fried or sweet foods.  Stay active. Ask your doctor which  exercises are safe for you.  Avoid stress.  Eat three small meals and three small snacks each day.  Work with a Publishing rights manager (dietitian) to make a meal plan that works for you.  Do not use any products that contain nicotine or tobacco, such as cigarettes and e-cigarettes. If you need help quitting, ask your doctor.   Contact a doctor if:  You have a fever.  You have chills. Get help right away if:  You have pain or cramps that get worse.  You throw up blood.  You are  sick to your stomach.  You cannot stop throwing up.  You cannot drink fluids.  You feel mixed up (confused).  You feel very thirsty (dehydrated).  Your belly gets more bloated.  You feel weak or you pass out (faint). Summary  A bowel obstruction means that something is blocking the small or large bowel.  Treatment may include IV fluids and pain medicine. You may also have a clear liquid diet, a small tube in your stomach, or surgery.  Drink clear liquids and avoid solid foods until you get better. This information is not intended to replace advice given to you by your health care provider. Make sure you discuss any questions you have with your health care provider. Document Revised: 11/12/2017 Document Reviewed: 11/12/2017 Elsevier Patient Education  [place].

## 2021-01-10 ENCOUNTER — Other Ambulatory Visit: Payer: Self-pay

## 2021-01-10 ENCOUNTER — Ambulatory Visit (INDEPENDENT_AMBULATORY_CARE_PROVIDER_SITE_OTHER): Payer: Managed Care, Other (non HMO)

## 2021-01-10 ENCOUNTER — Ambulatory Visit (INDEPENDENT_AMBULATORY_CARE_PROVIDER_SITE_OTHER): Payer: Managed Care, Other (non HMO) | Admitting: Physician Assistant

## 2021-01-10 ENCOUNTER — Encounter: Payer: Self-pay | Admitting: Physician Assistant

## 2021-01-10 VITALS — BP 132/80 | HR 112 | Ht 70.5 in | Wt 290.2 lb

## 2021-01-10 DIAGNOSIS — I700 Atherosclerosis of aorta: Secondary | ICD-10-CM

## 2021-01-10 DIAGNOSIS — R000 Tachycardia, unspecified: Secondary | ICD-10-CM

## 2021-01-10 DIAGNOSIS — R0602 Shortness of breath: Secondary | ICD-10-CM | POA: Diagnosis not present

## 2021-01-10 DIAGNOSIS — I10 Essential (primary) hypertension: Secondary | ICD-10-CM

## 2021-01-10 DIAGNOSIS — R072 Precordial pain: Secondary | ICD-10-CM

## 2021-01-10 LAB — D-DIMER, QUANTITATIVE: D-DIMER: 0.94 mg/L FEU — ABNORMAL HIGH (ref 0.00–0.49)

## 2021-01-10 NOTE — Progress Notes (Addendum)
Cardiology Office Note:    Date:  01/10/2021   ID:  Lindsay Little, DOB 10/21/1965, MRN [id number]  PCP:  [name], MD   [facility] Providers Cardiologist:  Candee Furbish, MD     Referring MD: [name], MD   Chief Complaint:  Tachycardia    Patient Profile:    Lindsay Little is a 55 y.o. female with:  Hx of dilated pulmonary artery on CT in 5/19 (3.5 cm) No evidence of pulmonary embolism  Echo 7/19: EF 50-55, GR 1 DD, normal RVSF Aortic atherosclerosis  GERD  Hypertension Hyperlipidemia  OSA Rectal CA  S/p colectomy, chemo/XRT Morbid obesity  History of lumbar discitis with vertebral osteomyelitis History of small bowel obstruction (3/22)  Prior CV studies: Echocardiogram 02/02/18 EF 50-55, GR 1 DD, no RWMA, normal RVSF, normal pulmonary artery size  History of Present Illness: Lindsay Little is a history of dilated pulmonary artery on CT scan in the past without evidence of pulmonary embolism and normal RV function on echocardiogram.  She was last seen by Dr. Marlou Porch in 5/21.      She was admitted in 3/22 with a small bowel obstruction.  She was made n.p.o. and her symptoms resolved without NG tube placement.  She did have a CT scan that demonstrated a multiloculated fluid density structure in the left lower quadrant/adnexa measuring 5.1 cm.  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  She had done well after discharge until around mid May.  She has started having fairly constant rectal pain with radiation to her gluteal area.  She also has lower abdominal pain.  She has had chronic drainage (rectal and vaginal) since her colon surgery.  She is due for colonoscopy next month.  She said continued nausea but no more vomiting.  She has had fevers (99.9) as well as some night sweats.  She was noted to be tachycardic with her primary care provider and asked to follow-up with  cardiology.  She does not really have significant palpitations.  She has been short of breath with activity more so than what is typical for her.  She has had chest pressure with activity on 2 occasions.  She has not had syncope or orthopnea.  She has had some dependent pedal edema.  She did have blood work with her PCP.  Her TSH was mildly elevated and her hemoglobin was normal.  Of note, she has chronic pain and is on a fentanyl patch.  Her pain specialist has recently started to taper her off of her fentanyl.  Her PCP has set her up for abdominal and pelvic CT.  That was scheduled for today but the scanner broke and she cannot have this done.    Past Medical History:  Diagnosis Date   Anxiety    Chemotherapy-induced neuropathy (HCC)    toes and fingers numbness and tingling   Colorectal delayed anastomotic leak s/p resection & colostomy 07/12/2016 11/17/2015   Colostomy in place [facility]) 07/12/2016   due to anastomosis breakdown w/ colovaginal fistula   Colovaginal fistula s/p omentopexy repair 07/12/2016 72/03/4708   Complication of anesthesia    Depression    Family history of adverse reaction to anesthesia    mother-- ponv   Family history of breast cancer    Family history of pancreatic cancer    Family history of skin cancer    Gastroenteritis 12/20/2015   GERD (gastroesophageal  reflux disease)    Hardware complicating wound infection ([place]) 08/03/2019   Hiatal hernia    History of cancer chemotherapy 02-20-2015 to 03-30-2015   History of cardiac murmur as a child    History of chemotherapy    History of chronic gastritis    History of hypertension    no issues since multiple abdminal sx's and chemo--- no medication since 12/ 2016   History of TMJ disorder    Hypercholesteremia    Hypertension    IBS (irritable bowel syndrome)    dx age 50   Intermittent abdominal pain    post-op multiple abdominal sx's    Microcytic anemia    Mild sleep apnea    per pt study 2014  very mild osa  , no cpap recommended, recommeded wt loss and sleep routine   OA (osteoarthritis)    left knee /  left shoulder   Obesity    PONV (postoperative nausea and vomiting)    severe" needs Scopolamine PATCH"    Portacath in place    right chest   Rectal adenocarcinoma ([place]) oncologis-  dr Burr Medico-- after radiation/ chemo (ypT1, N0) --  no recurrence per last note 03/ 2018   dx 01-13-2015-- Stage IIIC (T3, N2, M0) post concurrent radiation and chemotherapy 02-20-2015 to 03-30-2015 /  05-25-2015 s/p  LAR w/ RSO (post-op complicated by late abscess and contained anatomotic leak w/ help percutaneous drainage and antibiotics)    Rotator cuff tear, left    S/P radiation therapy 02/20/15-03/30/15   colon/rectal   Vitamin D deficiency    Wears glasses    Wears glasses     Current Medications: Current Meds  Medication Sig   bisoprolol (ZEBETA) 10 MG tablet Take 10 mg by mouth daily.   celecoxib (CELEBREX) 100 MG capsule Take 100 mg by mouth 2 (two) times daily.   Cholecalciferol (VITAMIN D3) 5000 units CAPS Take 5,000 Units by mouth at bedtime.    Coenzyme Q10 (COQ10) 200 MG CAPS Take 200 mg by mouth at bedtime.   DULoxetine (CYMBALTA) 60 MG capsule Take 120 mg by mouth at bedtime.    esomeprazole (NEXIUM) 40 MG capsule Take 1 capsule (40 mg total) by mouth 2 (two) times daily before a meal.   fentaNYL (DURAGESIC) 50 MCG/HR Place 1 patch onto the skin every 3 (three) days.   furosemide (LASIX) 20 MG tablet Take 20 mg by mouth daily as needed for edema.    gabapentin (NEURONTIN) 800 MG tablet Take 800 mg by mouth 3 (three) times daily.   HYDROcodone-acetaminophen (NORCO) 10-325 MG tablet Take 1 tablet by mouth every 8 (eight) hours as needed for severe pain.   levETIRAcetam (KEPPRA) 250 MG tablet Take 250 mg by mouth 2 (two) times daily.   Melatonin 3 MG TABS Take 3-6 mg by mouth at bedtime.    Multiple Vitamin (MULTIVITAMIN WITH MINERALS) TABS tablet Take 2 tablets by mouth at bedtime.    polyethylene  glycol (MIRALAX / GLYCOLAX) packet Take 17 g by mouth daily as needed for moderate constipation.   promethazine (PHENERGAN) 25 MG tablet TAKE ONE TABLET BY MOUTH EVERY 6 HOURS AS NEEDED FOR NAUSEA OR VOMITING   rosuvastatin (CRESTOR) 5 MG tablet Take 5 mg by mouth at bedtime.   triamcinolone lotion (KENALOG) 0.1 % Apply 1 application topically as needed (dry skin).     Allergies:   Caine-1 [lidocaine], Sulfa antibiotics, Adhesive [tape], Doxycycline, Flagyl [metronidazole], Iron, Oxycodone, and Penicillins   Social History  Tobacco Use   Smoking status: Former    Packs/day: 0.25    Years: 7.00    Pack years: 1.75    Types: Cigarettes    Quit date: 07/16/1991    Years since quitting: 29.5   Smokeless tobacco: Never  Vaping Use   Vaping Use: Never used  Substance Use Topics   Alcohol use: No   Drug use: Never     Family Hx: The patient's family history includes CVA in her maternal grandmother; Cancer in her sister; Cancer (age of onset: 86) in her maternal uncle; Cirrhosis in her maternal uncle; Coronary artery disease in her father, paternal grandfather, and paternal grandmother; Coronary artery disease (age of onset: 75) in her mother; Coronary artery disease (age of onset: 51) in her maternal grandfather; Diabetes Mellitus I in her maternal grandmother, mother, and paternal grandmother; Hyperlipidemia in her brother, father, maternal grandfather, maternal grandmother, mother, paternal grandfather, and paternal grandmother; Hypertension in her father, maternal grandfather, maternal grandmother, mother, paternal grandfather, and paternal grandmother.  Review of Systems  Constitutional: Negative for weight loss.  Gastrointestinal:  Negative for hematochezia and melena.  Genitourinary:  Negative for hematuria.  Psychiatric/Behavioral:         Increased social stressors    EKGs/Labs/Other Test Reviewed:    EKG:  EKG is  ordered today.  The ekg ordered today demonstrates sinus  tachycardia, HR 112, normal axis, anteroseptal Q waves, QTC 444, nonspecific ST-T wave changes, similar to prior tracings  Recent Labs: 10/12/2020: ALT 27 10/13/2020: Magnesium 2.1 10/14/2020: Hemoglobin 12.0; Platelets 229 10/17/2020: BUN 6; Creatinine, Ser 0.69; Potassium 4.1; Sodium 141   Recent Lipid Panel No results found for: CHOL, TRIG, HDL, LDLCALC, LDLDIRECT    Risk Assessment/Calculations:      Physical Exam:    VS:  BP 132/80   Pulse (!) 112   Ht 5' 10.5" (1.791 m)   Wt 290 lb 3.2 oz (131.6 kg)   SpO2 97%   BMI 41.05 kg/m     Wt Readings from Last 3 Encounters:  01/10/21 290 lb 3.2 oz (131.6 kg)  10/12/20 300 lb (136.1 kg)  12/24/19 297 lb 8 oz (134.9 kg)     Constitutional:      Appearance: Healthy appearance. Not in distress.  Pulmonary:     Effort: Pulmonary effort is normal.     Breath sounds: No wheezing. No rales.  Cardiovascular:     Tachycardia present. Regular rhythm. Normal S1. Normal S2.      Murmurs: There is no murmur.  Edema:    Peripheral edema absent.  Abdominal:     Palpations: Abdomen is soft.  Musculoskeletal:     Cervical back: Neck supple. Skin:    General: Skin is warm and dry.  Neurological:     Mental Status: Alert and oriented to person, place and time.     Cranial Nerves: Cranial nerves are intact.        ASSESSMENT & PLAN:    1. Sinus tachycardia 2. Precordial chest pain 3. Shortness of breath The patient was able to show me her recent lab results from her health portal.  On 01/04/2021: Creatinine 0.8, K+ 4.7, calcium 10.7, albumin 4.4, ALT 16, hemoglobin 15, TSH 4.78.  Therefore, she is not anemic and she is not hyperthyroid.  I suspect her sinus tachycardia is secondary to an underlying condition.  Possible causes are increasing pain from decreasing pain medications, underlying infection, recurrent malignancy.  She has had increasing rectal and lower abdominal  pain which can be contributing.  She did have a couple of episodes  of chest pain.  These were with exertion.  She has heart rate in the low 1 teens.  I do not think it is feasible to get a coronary CTA.  We will not be able to get her heart rate low enough.  I will set her up for an echocardiogram to rule out structural heart disease.  If her EF is down, we will need to consider proceeding with cardiac catheterization.  If her EF is normal, I will arrange a Lexiscan Myoview to rule out the possibility of ischemia.  I have discussed risks and benefits of both procedures with her today.  I will also obtain a BNP and D-dimer today.  If the D-dimer is elevated, I will arrange a chest CT to rule out pulmonary embolism.  I will also set up for a 3-day ZIO monitor to better characterize her arrhythmia.  Arrange follow-up in 4-6 weeks.  4. Aortic atherosclerosis (HCC) Continue statin therapy.  5. Essential hypertension Blood pressure was significantly elevated.  Her pressures were in the 160s/100s when she was seen with primary care.  Pressure today is much better controlled.  Continue current dose of bisoprolol.  Continue to monitor.      Dispo:  Return in about 6 weeks (around 02/21/2021) for Routine follow up in 6 weeks with Richardson Dopp, PA-C.   Medication Adjustments/Labs and Tests Ordered: Current medicines are reviewed at length with the patient today.  Concerns regarding medicines are outlined above.  Tests Ordered: Orders Placed This Encounter  Procedures   Pro b natriuretic peptide   D-Dimer, Quantitative   LONG TERM MONITOR (3-14 DAYS)   EKG 12-Lead   ECHOCARDIOGRAM COMPLETE   Medication Changes: No orders of the defined types were placed in this encounter.   Signed, Richardson Dopp, PA-C  01/10/2021 5:40 PM    [facility] [place], [place], [place]  [address] Phone: [phone]; Fax: [phone]

## 2021-01-10 NOTE — Patient Instructions (Addendum)
Medication Instructions:   Your physician recommends that you continue on your current medications as directed. Please refer to the Current Medication list given to you today.  *If you need a refill on your cardiac medications before your next appointment, please call your pharmacy*   Lab Work:  TODAY!!!!!STAT D DIMER/PRO BNP  If you have labs (blood work) drawn today and your tests are completely normal, you will receive your results only by: [place] (if you have MyChart) OR A paper copy in the mail If you have any lab test that is abnormal or we need to change your treatment, we will call you to review the results.   Testing/Procedures: [place]- Long Term Monitor Instructions  Your physician has requested you wear a ZIO patch monitor for 3 days.  This is a single patch monitor. [employer] supplies one patch monitor per enrollment. Additional stickers are not available. Please do not apply patch if you will be having a Nuclear Stress Test,  Echocardiogram, Cardiac CT, MRI, or Chest Xray during the period you would be wearing the  monitor. The patch cannot be worn during these tests. You cannot remove and re-apply the  ZIO XT patch monitor.  Your ZIO patch monitor will be mailed 3 day USPS to your address on file. It may take 3-5 days  to receive your monitor after you have been enrolled.  Once you have received your monitor, please review the enclosed instructions. Your monitor  has already been registered assigning a specific monitor serial # to you.  Billing and Patient Assistance Program Information  We have supplied [employer] with any of your insurance information on file for billing purposes. [employer] offers a sliding scale Patient Assistance Program for patients that do not have  insurance, or whose insurance does not completely cover the cost of the ZIO monitor.  You must apply for the Patient Assistance Program to qualify for this discounted rate.  To apply, please call  [employer] at [phone], select option 4, select option 2, ask to apply for  Patient Assistance Program. Theodore Demark will ask your household income, and how many people  are in your household. They will quote your out-of-pocket cost based on that information.  [employer] will also be able to set up a 70-month interest-free payment plan if needed.  Applying the monitor   Shave hair from upper left chest.  Hold abrader disc by orange tab. Rub abrader in 40 strokes over the upper left chest as  indicated in your monitor instructions.  Clean area with 4 enclosed alcohol pads. Let dry.  Apply patch as indicated in monitor instructions. Patch will be placed under collarbone on left  side of chest with arrow pointing upward.  Rub patch adhesive wings for 2 minutes. Remove white label marked "1". Remove the white  label marked "2". Rub patch adhesive wings for 2 additional minutes.  While looking in a mirror, press and release button in center of patch. A small green light will  flash 3-4 times. This will be your only indicator that the monitor has been turned on.  Do not shower for the first 24 hours. You may shower after the first 24 hours.  Press the button if you feel a symptom. You will hear a small click. Record Date, Time and  Symptom in the Patient Logbook.  When you are ready to remove the patch, follow instructions on the last 2 pages of Patient  Logbook. Stick patch monitor onto the last page of Patient  Logbook.  Place Patient Logbook in the blue and white box. Use locking tab on box and tape box closed  securely. The blue and white box has prepaid postage on it. Please place it in the mailbox as  soon as possible. Your physician should have your test results approximately 7 days after the  monitor has been mailed back to [facility].  Call [place] at [phone] if you have questions regarding  your ZIO XT patch monitor. Call them immediately if you see an orange  light blinking on your  monitor.  If your monitor falls off in less than 4 days, contact our Monitor department at [phone].  If your monitor becomes loose or falls off after 4 days call [employer] at [phone] for  suggestions on securing your monitor  Your physician has requested that you have an echocardiogram. Friday, July 15 @ 7:05 am. Echocardiography is a painless test that uses sound waves to create images of your heart. It provides your doctor with information about the size and shape of your heart and how well your heart's chambers and valves are working. This procedure takes approximately one hour. There are no restrictions for this procedure.   Follow-Up: At [facility], you and your health needs are our priority.  As part of our continuing mission to provide you with exceptional heart care, we have created designated Provider Care Teams.  These Care Teams include your primary Cardiologist (physician) and Advanced Practice Providers (APPs -  Physician Assistants and Nurse Practitioners) who all work together to provide you with the care you need, when you need it.  We recommend signing up for the patient portal called "MyChart".  Sign up information is provided on this After Visit Summary.  MyChart is used to connect with patients for Virtual Visits (Telemedicine).  Patients are able to view lab/test results, encounter notes, upcoming appointments, etc.  Non-urgent messages can be sent to your provider as well.   To learn more about what you can do with MyChart, go to [email or web address].    Your next appointment:   6 week(s) on Wednesday, August 10 @ 8:45 am.   The format for your next appointment:   In Person  Provider:   Richardson Dopp, PA-C   Other Instructions

## 2021-01-10 NOTE — Progress Notes (Unsigned)
Patient enrolled for [employer] to mail a ZIO XT monitor to address on file.

## 2021-01-11 ENCOUNTER — Ambulatory Visit (INDEPENDENT_AMBULATORY_CARE_PROVIDER_SITE_OTHER)
Admission: RE | Admit: 2021-01-11 | Discharge: 2021-01-11 | Disposition: A | Discharge status: Home / Self Care | Payer: Managed Care, Other (non HMO) | Source: Ambulatory Visit | Attending: Physician Assistant | Admitting: Physician Assistant

## 2021-01-11 ENCOUNTER — Telehealth: Payer: Self-pay

## 2021-01-11 DIAGNOSIS — R0602 Shortness of breath: Secondary | ICD-10-CM | POA: Diagnosis not present

## 2021-01-11 DIAGNOSIS — R7989 Other specified abnormal findings of blood chemistry: Secondary | ICD-10-CM

## 2021-01-11 LAB — PRO B NATRIURETIC PEPTIDE: NT-Pro BNP: 12 pg/mL (ref 0–249)

## 2021-01-11 MED ORDER — IOHEXOL 350 MG/ML SOLN
80.0000 mL | Freq: Once | INTRAVENOUS | Status: AC | PRN
  Administered 2021-01-11: 80 mL via INTRAVENOUS

## 2021-01-11 NOTE — Telephone Encounter (Signed)
-----   Message from Liliane Shi, [place] sent at 01/10/2021  5:44 PM EDT ----- DDimer mildly elevated.  Given tachycardia, shortness of breath and prior hx of malignancy, it is reasonable to proceed with a chest CT to rule out pulmonary embolism. Please arrange Cheat CTA with contrast to rule out PE tomorrow (01/11/21). See my OV note from today for recent Creatinine. Richardson Dopp, PA-C    01/10/2021 5:43 PM

## 2021-01-11 NOTE — Telephone Encounter (Signed)
Spoke with pt. She agrees to come in for a STAT chest CT today. She is scheduled for 2pm today at [facility] location. She was instructed to bring in her recent lab work from her PCP and no food after 12pm. She was instructed to increase water intake in preparation for IV contrast and IV start. She verbalized understanding and had no additional questions.

## 2021-01-12 DIAGNOSIS — R0602 Shortness of breath: Secondary | ICD-10-CM

## 2021-01-12 DIAGNOSIS — R000 Tachycardia, unspecified: Secondary | ICD-10-CM

## 2021-01-12 DIAGNOSIS — I700 Atherosclerosis of aorta: Secondary | ICD-10-CM | POA: Diagnosis not present

## 2021-01-12 DIAGNOSIS — R072 Precordial pain: Secondary | ICD-10-CM | POA: Diagnosis not present

## 2021-01-12 DIAGNOSIS — I10 Essential (primary) hypertension: Secondary | ICD-10-CM

## 2021-01-26 ENCOUNTER — Other Ambulatory Visit (HOSPITAL_COMMUNITY): Payer: Managed Care, Other (non HMO)

## 2021-02-14 ENCOUNTER — Encounter: Payer: Self-pay | Admitting: Hematology

## 2021-02-14 ENCOUNTER — Encounter (HOSPITAL_COMMUNITY): Payer: Self-pay

## 2021-02-14 ENCOUNTER — Other Ambulatory Visit (HOSPITAL_COMMUNITY): Payer: Managed Care, Other (non HMO)

## 2021-02-14 ENCOUNTER — Encounter (HOSPITAL_COMMUNITY): Payer: Self-pay | Admitting: Physician Assistant

## 2021-02-14 NOTE — Progress Notes (Unsigned)
Verified appointment "no show" status with R. Cathey at 09:29.

## 2021-02-20 NOTE — Progress Notes (Deleted)
Cardiology Office Note:    Date:  02/20/2021   ID:  Lindsay Little, DOB 15-Jan-1966, MRN [id number]  PCP:  [name], MD   [facility] Providers Cardiologist:  Candee Furbish, MD { Click to update primary MD,subspecialty MD or APP then REFRESH:1}  ***  Referring MD: [name], MD   Chief Complaint:  No chief complaint on file.    Patient Profile:    Lindsay Little is a 55 y.o. female with:  Hx of dilated pulmonary artery on CT in 5/19 (3.5 cm) No evidence of pulmonary embolism  Echo 7/19: EF 50-55, GR 1 DD, normal RVSF Aortic atherosclerosis GERD Hypertension Hyperlipidemia OSA Rectal CA S/p colectomy, chemo/XRT Morbid obesity  History of lumbar discitis with vertebral osteomyelitis History of small bowel obstruction (3/22)   Prior CV studies: LONG TERM MONITOR (3-7 DAYS) INTERPRETATION 01/23/2021 Narrative  Sinus rhythm with average heart rate of 69 bpm.  On the last day of monitoring, heart rate after 12 noon to 6pm was slightly above 100 BPM. On average throughout monitoring, HR 69 BPM  Rare PVC and PAC's  No atrial fibrillation, no pauses, no adverse rhythms  Symptoms of "pounding" were associated with sinus rhythm  Reassuring monitor  Chest CT 01/11/21 IMPRESSION: 1. No acute intrathoracic pathology. No CT evidence of pulmonary embolism. 2. Fatty liver.  Echocardiogram 02/02/18 EF 50-55, GR 1 DD, no RWMA, normal RVSF, normal pulmonary artery size  History of Present Illness: Lindsay Little has  a history of dilated pulmonary artery on CT scan in the past without evidence of pulmonary embolism and normal RV function on echocardiogram.  I saw her in 6/22 for the evaluation of sinus tachycardia.  She had been admitted in 3/22 with a SBO.  She developed severe rectal pain in May and nausea.  She was waiting on f/u with GI and to have a f/u colonoscopy.  Her fentanyl had recently been cut back for chronic pain.  She was also running fevers and had  night sweats.  She had some episodes of chest pain.  A heart monitor showed NSR with avg HR 69 and no AFib.  A DDimer was elevated and a CT was neg for pulmonary embolism. An echocardiogram was ordered with plans to proceed with cardiac catheterization if her EF is down or a Myoview if her EF is normal.  Her echocardiogram has not yet been done.  She returns for f/u.  ***    Past Medical History:  Diagnosis Date   Anxiety    Chemotherapy-induced neuropathy (HCC)    toes and fingers numbness and tingling   Colorectal delayed anastomotic leak s/p resection & colostomy 07/12/2016 11/17/2015   Colostomy in place [facility]) 07/12/2016   due to anastomosis breakdown w/ colovaginal fistula   Colovaginal fistula s/p omentopexy repair 07/12/2016 41/58/3094   Complication of anesthesia    Depression    Family history of adverse reaction to anesthesia    mother-- ponv   Family history of breast cancer    Family history of pancreatic cancer    Family history of skin cancer    Gastroenteritis 12/20/2015   GERD (gastroesophageal reflux disease)    Hardware complicating wound infection ([place]) 08/03/2019   Hiatal hernia    History of cancer chemotherapy 02-20-2015 to 03-30-2015   History of cardiac murmur as a child    History of chemotherapy    History of chronic gastritis    History of hypertension    no issues since multiple abdminal  sx's and chemo--- no medication since 12/ 2016   History of TMJ disorder    Hypercholesteremia    Hypertension    IBS (irritable bowel syndrome)    dx age 67   Intermittent abdominal pain    post-op multiple abdominal sx's    Microcytic anemia    Mild sleep apnea    per pt study 2014  very mild osa , no cpap recommended, recommeded wt loss and sleep routine   OA (osteoarthritis)    left knee /  left shoulder   Obesity    PONV (postoperative nausea and vomiting)    severe" needs Scopolamine PATCH"    Portacath in place    right chest   Rectal adenocarcinoma ([place])  oncologis-  dr Burr Medico-- after radiation/ chemo (ypT1, N0) --  no recurrence per last note 03/ 2018   dx 01-13-2015-- Stage IIIC (T3, N2, M0) post concurrent radiation and chemotherapy 02-20-2015 to 03-30-2015 /  05-25-2015 s/p  LAR w/ RSO (post-op complicated by late abscess and contained anatomotic leak w/ help percutaneous drainage and antibiotics)    Rotator cuff tear, left    S/P radiation therapy 02/20/15-03/30/15   colon/rectal   Vitamin D deficiency    Wears glasses    Wears glasses     Current Medications: No outpatient medications have been marked as taking for the 02/21/21 encounter (Appointment) with Richardson Dopp T, PA-C.     Allergies:   Caine-1 [lidocaine], Sulfa antibiotics, Adhesive [tape], Doxycycline, Flagyl [metronidazole], Iron, Oxycodone, and Penicillins   Social History   Tobacco Use   Smoking status: Former    Packs/day: 0.25    Years: 7.00    Pack years: 1.75    Types: Cigarettes    Quit date: 07/16/1991    Years since quitting: 29.6   Smokeless tobacco: Never  Vaping Use   Vaping Use: Never used  Substance Use Topics   Alcohol use: No   Drug use: Never     Family Hx: The patient's family history includes CVA in her maternal grandmother; Cancer in her sister; Cancer (age of onset: 53) in her maternal uncle; Cirrhosis in her maternal uncle; Coronary artery disease in her father, paternal grandfather, and paternal grandmother; Coronary artery disease (age of onset: 32) in her mother; Coronary artery disease (age of onset: 1) in her maternal grandfather; Diabetes Mellitus I in her maternal grandmother, mother, and paternal grandmother; Hyperlipidemia in her brother, father, maternal grandfather, maternal grandmother, mother, paternal grandfather, and paternal grandmother; Hypertension in her father, maternal grandfather, maternal grandmother, mother, paternal grandfather, and paternal grandmother.  ROS   EKGs/Labs/Other Test Reviewed:    EKG:  EKG is *** ordered  today.  The ekg ordered today demonstrates ***  Recent Labs: 10/12/2020: ALT 27 10/13/2020: Magnesium 2.1 10/14/2020: Hemoglobin 12.0; Platelets 229 10/17/2020: BUN 6; Creatinine, Ser 0.69; Potassium 4.1; Sodium 141 01/10/2021: NT-Pro BNP 12   Recent Lipid Panel No results found for: CHOL, TRIG, HDL, LDLCALC, LDLDIRECT    Risk Assessment/Calculations:   {Does this patient have ATRIAL FIBRILLATION?[phone]}  Physical Exam:    VS:  There were no vitals taken for this visit.    Wt Readings from Last 3 Encounters:  01/10/21 290 lb 3.2 oz (131.6 kg)  10/12/20 300 lb (136.1 kg)  12/24/19 297 lb 8 oz (134.9 kg)     Physical Exam ***     ASSESSMENT & PLAN:    {Select for Dx:25819} 1. Sinus tachycardia 2. Precordial chest pain 3. Shortness of breath  The patient was able to show me her recent lab results from her health portal.  On 01/04/2021: Creatinine 0.8, K+ 4.7, calcium 10.7, albumin 4.4, ALT 16, hemoglobin 15, TSH 4.78.  Therefore, she is not anemic and she is not hyperthyroid.  I suspect her sinus tachycardia is secondary to an underlying condition.  Possible causes are increasing pain from decreasing pain medications, underlying infection, recurrent malignancy.  She has had increasing rectal and lower abdominal pain which can be contributing.  She did have a couple of episodes of chest pain.  These were with exertion.  She has heart rate in the low 1 teens.  I do not think it is feasible to get a coronary CTA.  We will not be able to get her heart rate low enough.  I will set her up for an echocardiogram to rule out structural heart disease.  If her EF is down, we will need to consider proceeding with cardiac catheterization.  If her EF is normal, I will arrange a Lexiscan Myoview to rule out the possibility of ischemia.  I have discussed risks and benefits of both procedures with her today.  I will also obtain a BNP and D-dimer today.  If the D-dimer is elevated, I will arrange a chest  CT to rule out pulmonary embolism.  I will also set up for a 3-day ZIO monitor to better characterize her arrhythmia.  Arrange follow-up in 4-6 weeks.   4. Aortic atherosclerosis (HCC) Continue statin therapy.   5. Essential hypertension Blood pressure was significantly elevated.  Her pressures were in the 160s/100s when she was seen with primary care.  Pressure today is much better controlled.  Continue current dose of bisoprolol.  Continue to monitor.    {Are you ordering a CV Procedure (e.g. stress test, cath, DCCV, TEE, etc)?   Press F2        :[id number]}    Dispo:  No follow-ups on file.   Medication Adjustments/Labs and Tests Ordered: Current medicines are reviewed at length with the patient today.  Concerns regarding medicines are outlined above.  Tests Ordered: No orders of the defined types were placed in this encounter.  Medication Changes: No orders of the defined types were placed in this encounter.   Signed, Richardson Dopp, PA-C  02/20/2021 8:42 PM    [facility] [address], [place], [place]  [address] Phone: [phone]; Fax: [phone]

## 2021-02-21 ENCOUNTER — Ambulatory Visit: Payer: Managed Care, Other (non HMO) | Admitting: Physician Assistant

## 2021-02-21 DIAGNOSIS — R0602 Shortness of breath: Secondary | ICD-10-CM

## 2021-02-21 DIAGNOSIS — I10 Essential (primary) hypertension: Secondary | ICD-10-CM

## 2021-02-21 DIAGNOSIS — I700 Atherosclerosis of aorta: Secondary | ICD-10-CM

## 2021-02-21 DIAGNOSIS — R000 Tachycardia, unspecified: Secondary | ICD-10-CM

## 2021-03-21 DIAGNOSIS — Z79891 Long term (current) use of opiate analgesic: Secondary | ICD-10-CM | POA: Diagnosis not present

## 2021-03-21 DIAGNOSIS — C20 Malignant neoplasm of rectum: Secondary | ICD-10-CM | POA: Diagnosis not present

## 2021-03-22 ENCOUNTER — Encounter: Payer: Self-pay | Admitting: Hematology

## 2021-03-29 ENCOUNTER — Encounter (HOSPITAL_COMMUNITY): Payer: Self-pay

## 2021-03-29 ENCOUNTER — Encounter (HOSPITAL_COMMUNITY): Payer: Self-pay | Admitting: Physician Assistant

## 2021-03-29 ENCOUNTER — Ambulatory Visit (HOSPITAL_COMMUNITY): Payer: Medicare HMO | Attending: Cardiovascular Disease

## 2021-04-11 ENCOUNTER — Telehealth (HOSPITAL_COMMUNITY): Payer: Self-pay | Admitting: Physician Assistant

## 2021-04-11 NOTE — Telephone Encounter (Signed)
[address] [place], PA-C 04/11/2021 5:10 PM

## 2021-04-11 NOTE — Telephone Encounter (Signed)
Just an FYI. We have made several attempts to contact this patient including sending a letter to schedule or reschedule their echocardiogram. We will be removing the patient from the echo [place].   03/29/21 NO HSOWED X 2 MAILED LETTER [facility] -pt has no showed x2.  02/14/21 NO SHOWED-MAILED LETTER [facility]    Thank you

## 2021-05-08 DIAGNOSIS — M5416 Radiculopathy, lumbar region: Secondary | ICD-10-CM | POA: Diagnosis not present

## 2021-05-08 DIAGNOSIS — Z79891 Long term (current) use of opiate analgesic: Secondary | ICD-10-CM | POA: Diagnosis not present

## 2021-05-08 DIAGNOSIS — C20 Malignant neoplasm of rectum: Secondary | ICD-10-CM | POA: Diagnosis not present

## 2021-07-04 DIAGNOSIS — C20 Malignant neoplasm of rectum: Secondary | ICD-10-CM | POA: Diagnosis not present

## 2021-07-04 DIAGNOSIS — Z79891 Long term (current) use of opiate analgesic: Secondary | ICD-10-CM | POA: Diagnosis not present

## 2021-07-19 DIAGNOSIS — E785 Hyperlipidemia, unspecified: Secondary | ICD-10-CM | POA: Diagnosis not present

## 2021-07-19 DIAGNOSIS — K219 Gastro-esophageal reflux disease without esophagitis: Secondary | ICD-10-CM | POA: Diagnosis not present

## 2021-07-19 DIAGNOSIS — R946 Abnormal results of thyroid function studies: Secondary | ICD-10-CM | POA: Diagnosis not present

## 2021-07-19 DIAGNOSIS — E559 Vitamin D deficiency, unspecified: Secondary | ICD-10-CM | POA: Diagnosis not present

## 2021-07-19 DIAGNOSIS — I10 Essential (primary) hypertension: Secondary | ICD-10-CM | POA: Diagnosis not present

## 2021-07-19 DIAGNOSIS — Z23 Encounter for immunization: Secondary | ICD-10-CM | POA: Diagnosis not present

## 2021-07-19 DIAGNOSIS — G894 Chronic pain syndrome: Secondary | ICD-10-CM | POA: Diagnosis not present

## 2021-07-19 DIAGNOSIS — G6289 Other specified polyneuropathies: Secondary | ICD-10-CM | POA: Diagnosis not present

## 2021-07-19 DIAGNOSIS — F3341 Major depressive disorder, recurrent, in partial remission: Secondary | ICD-10-CM | POA: Diagnosis not present

## 2021-07-25 ENCOUNTER — Encounter: Payer: Self-pay | Admitting: Hematology

## 2021-08-02 ENCOUNTER — Ambulatory Visit (INDEPENDENT_AMBULATORY_CARE_PROVIDER_SITE_OTHER): Payer: Self-pay | Admitting: Podiatry

## 2021-08-02 DIAGNOSIS — Z91199 Patient's noncompliance with other medical treatment and regimen due to unspecified reason: Secondary | ICD-10-CM

## 2021-08-02 NOTE — Progress Notes (Signed)
No show

## 2021-08-27 ENCOUNTER — Ambulatory Visit: Payer: Medicare HMO | Admitting: Podiatry

## 2021-08-31 ENCOUNTER — Ambulatory Visit: Payer: Medicare HMO | Admitting: Podiatry

## 2021-09-05 DIAGNOSIS — C20 Malignant neoplasm of rectum: Secondary | ICD-10-CM | POA: Diagnosis not present

## 2021-09-05 DIAGNOSIS — M5416 Radiculopathy, lumbar region: Secondary | ICD-10-CM | POA: Diagnosis not present

## 2021-09-05 DIAGNOSIS — M961 Postlaminectomy syndrome, not elsewhere classified: Secondary | ICD-10-CM | POA: Diagnosis not present

## 2021-09-05 DIAGNOSIS — G894 Chronic pain syndrome: Secondary | ICD-10-CM | POA: Diagnosis not present

## 2021-09-19 DIAGNOSIS — C20 Malignant neoplasm of rectum: Secondary | ICD-10-CM | POA: Diagnosis not present

## 2021-09-19 DIAGNOSIS — N739 Female pelvic inflammatory disease, unspecified: Secondary | ICD-10-CM | POA: Diagnosis not present

## 2021-09-26 ENCOUNTER — Telehealth: Payer: Self-pay | Admitting: Hematology

## 2021-09-26 NOTE — Telephone Encounter (Signed)
Scheduled appt per 3/15 referral. Pt was already established with Dr. Burr Medico. Pt is aware of appt date and time. Pt is aware to arrive 15 mins prior to appt time and to bring and updated insurance card. Pt is aware of appt location.   ?

## 2021-10-03 DIAGNOSIS — G894 Chronic pain syndrome: Secondary | ICD-10-CM | POA: Diagnosis not present

## 2021-10-03 DIAGNOSIS — M5416 Radiculopathy, lumbar region: Secondary | ICD-10-CM | POA: Diagnosis not present

## 2021-10-03 DIAGNOSIS — M961 Postlaminectomy syndrome, not elsewhere classified: Secondary | ICD-10-CM | POA: Diagnosis not present

## 2021-10-03 DIAGNOSIS — Z79891 Long term (current) use of opiate analgesic: Secondary | ICD-10-CM | POA: Diagnosis not present

## 2021-10-03 DIAGNOSIS — M47816 Spondylosis without myelopathy or radiculopathy, lumbar region: Secondary | ICD-10-CM | POA: Diagnosis not present

## 2021-10-03 DIAGNOSIS — M1712 Unilateral primary osteoarthritis, left knee: Secondary | ICD-10-CM | POA: Diagnosis not present

## 2021-10-03 DIAGNOSIS — G620 Drug-induced polyneuropathy: Secondary | ICD-10-CM | POA: Insufficient documentation

## 2021-10-03 DIAGNOSIS — K6289 Other specified diseases of anus and rectum: Secondary | ICD-10-CM | POA: Diagnosis not present

## 2021-10-03 DIAGNOSIS — M4316 Spondylolisthesis, lumbar region: Secondary | ICD-10-CM | POA: Insufficient documentation

## 2021-10-03 DIAGNOSIS — C20 Malignant neoplasm of rectum: Secondary | ICD-10-CM | POA: Diagnosis not present

## 2021-10-03 DIAGNOSIS — T451X5A Adverse effect of antineoplastic and immunosuppressive drugs, initial encounter: Secondary | ICD-10-CM | POA: Insufficient documentation

## 2021-10-04 ENCOUNTER — Other Ambulatory Visit: Payer: Self-pay

## 2021-10-04 ENCOUNTER — Ambulatory Visit (INDEPENDENT_AMBULATORY_CARE_PROVIDER_SITE_OTHER): Payer: Medicare HMO

## 2021-10-04 ENCOUNTER — Encounter: Payer: Self-pay | Admitting: Podiatry

## 2021-10-04 ENCOUNTER — Ambulatory Visit: Payer: Medicare HMO | Admitting: Podiatry

## 2021-10-04 DIAGNOSIS — M79672 Pain in left foot: Secondary | ICD-10-CM

## 2021-10-04 DIAGNOSIS — L84 Corns and callosities: Secondary | ICD-10-CM | POA: Diagnosis not present

## 2021-10-04 DIAGNOSIS — G620 Drug-induced polyneuropathy: Secondary | ICD-10-CM

## 2021-10-04 DIAGNOSIS — M79671 Pain in right foot: Secondary | ICD-10-CM

## 2021-10-04 DIAGNOSIS — R200 Anesthesia of skin: Secondary | ICD-10-CM | POA: Diagnosis not present

## 2021-10-04 NOTE — Progress Notes (Signed)
?Subjective:  ?Patient ID: CHANDRA ASHER, female    DOB: [birth year],   MRN: [id number] ? ?Chief Complaint  ?Patient presents with  ? Foot Pain  ?  polyneuropathies; bil/more severe on the left  ? ? ?56 y.o. female presents for concern of neuropathy and bilateral foot pain. Relates left is worse than right.  Relates she has bilateral calluses on her second toe.  She is not diabetic. She also has a spot on her left foot that has been there for a year she was concerned about but no pain. She has chemo induced neuropathy an din remission from colorectal cancer.  Denies any other pedal complaints. Denies n/v/f/c.  ? ?Past Medical History:  ?Diagnosis Date  ? Anxiety   ? Chemotherapy-induced neuropathy ([place])   ? toes and fingers numbness and tingling  ? Colorectal delayed anastomotic leak s/p resection & colostomy 07/12/2016 11/17/2015  ? Colostomy in place [facility]) 07/12/2016  ? due to anastomosis breakdown w/ colovaginal fistula  ? Colovaginal fistula s/p omentopexy repair 07/12/2016 07/10/2016  ? Complication of anesthesia   ? Depression   ? Family history of adverse reaction to anesthesia   ? mother-- ponv  ? Family history of breast cancer   ? Family history of pancreatic cancer   ? Family history of skin cancer   ? Gastroenteritis 12/20/2015  ? GERD (gastroesophageal reflux disease)   ? Hardware complicating wound infection ([place]) 08/03/2019  ? Hiatal hernia   ? History of cancer chemotherapy 02-20-2015 to 03-30-2015  ? History of cardiac murmur as a child   ? History of chemotherapy   ? History of chronic gastritis   ? History of hypertension   ? no issues since multiple abdminal sx's and chemo--- no medication since 12/ 2016  ? History of TMJ disorder   ? Hypercholesteremia   ? Hypertension   ? IBS (irritable bowel syndrome)   ? dx age 53  ? Intermittent abdominal pain   ? post-op multiple abdominal sx's   ? Microcytic anemia   ? Mild sleep apnea   ? per pt study 2014  very mild osa , no cpap recommended, recommeded wt  loss and sleep routine  ? OA (osteoarthritis)   ? left knee /  left shoulder  ? Obesity   ? PONV (postoperative nausea and vomiting)   ? severe" needs Scopolamine PATCH"   ? Portacath in place   ? right chest  ? Rectal adenocarcinoma ([place]) oncologis-  dr Burr Medico-- after radiation/ chemo (ypT1, N0) --  no recurrence per last note 03/ 2018  ? dx 01-13-2015-- Stage IIIC (T3, N2, M0) post concurrent radiation and chemotherapy 02-20-2015 to 03-30-2015 /  05-25-2015 s/p  LAR w/ RSO (post-op complicated by late abscess and contained anatomotic leak w/ help percutaneous drainage and antibiotics)   ? Rotator cuff tear, left   ? S/P radiation therapy 02/20/15-03/30/15  ? colon/rectal  ? Vitamin D deficiency   ? Wears glasses   ? Wears glasses   ? ? ?Objective:  ?Physical Exam: ?Vascular: DP/PT pulses 2/4 bilateral. CFT <3 seconds. Normal hair growth on digits. No edema.  ?Skin. No lacerations or abrasions bilateral feet. Hyperkeratotic lesions noted to distal second digits that appear pre-ulcerative. Small hyperkeratotic lesion to dorsal right fourth digit.  ?Musculoskeletal: MMT 5/5 bilateral lower extremities in DF, PF, Inversion and Eversion. Deceased ROM in DF of ankle joint.  ?Neurological: Sensation intact to light touch. Protective sensation diminished.  ? ?Assessment:  ? ?1. Pre-ulcerative calluses   ?  2. Chemotherapy-induced peripheral neuropathy ([place])   ? ? ? ?Plan:  ?Patient was evaluated and treated and all questions answered. ?-Discussed and educated patient on foot care, especially with  ?regards to the vascular, neurological and musculoskeletal systems.  ?-Stressed the importance of good glycemic control and the detriment of not controlling glucose levels in relation to the foot. ?-Discussed supportive shoes at all times and checking feet regularly.  ?-Hyperkeratotic tissue debrided without incident.  ?-Answered all patient questions ?-Patient to return  in 3 months for at risk foot care ?-Patient advised to call the  office if any problems or questions arise in the meantime. ? ? ?Lorenda Peck, DPM  ? ? ?

## 2021-10-11 ENCOUNTER — Other Ambulatory Visit: Payer: Self-pay

## 2021-10-11 ENCOUNTER — Inpatient Hospital Stay: Payer: Medicare HMO | Admitting: Hematology

## 2021-10-11 ENCOUNTER — Telehealth: Payer: Self-pay | Admitting: Hematology

## 2021-10-11 ENCOUNTER — Telehealth: Payer: Self-pay

## 2021-10-11 NOTE — Telephone Encounter (Signed)
.  Called patient to schedule appointment per 3/30 inbasket, patient is aware of date and time.   ?

## 2021-10-11 NOTE — Telephone Encounter (Signed)
Spoke with receptionist with [facility]  (Dr. Dema Severin) [phone] regarding the recent patient referral sent to Dr. Burr Medico on 09/26/2021.  Dr. Burr Medico sent secure chat to this RN inquiring on why was referred to her because the pt was d/c from Dr. Ernestina Penna care.  Dr. Burr Medico stated the referral does not indicate as to why pt was referred back to her services.  Receptionist at [facility] stated pt was referred back to Dr. Burr Medico d/t continued vaginal discharge.  Requested for referral notes and last office note of Dr. Orest Dikes be faxed to Dr. Ernestina Penna office.  Receptionist stated she will contact their referral team and have them send Dr. Burr Medico the information she needs. ?

## 2021-11-02 DIAGNOSIS — R7303 Prediabetes: Secondary | ICD-10-CM | POA: Diagnosis not present

## 2021-11-02 DIAGNOSIS — R350 Frequency of micturition: Secondary | ICD-10-CM | POA: Diagnosis not present

## 2021-11-02 DIAGNOSIS — N3090 Cystitis, unspecified without hematuria: Secondary | ICD-10-CM | POA: Diagnosis not present

## 2021-11-02 DIAGNOSIS — L989 Disorder of the skin and subcutaneous tissue, unspecified: Secondary | ICD-10-CM | POA: Diagnosis not present

## 2021-11-02 DIAGNOSIS — E785 Hyperlipidemia, unspecified: Secondary | ICD-10-CM | POA: Diagnosis not present

## 2021-11-02 DIAGNOSIS — R946 Abnormal results of thyroid function studies: Secondary | ICD-10-CM | POA: Diagnosis not present

## 2021-11-02 DIAGNOSIS — E559 Vitamin D deficiency, unspecified: Secondary | ICD-10-CM | POA: Diagnosis not present

## 2021-11-15 ENCOUNTER — Inpatient Hospital Stay: Payer: Medicare HMO | Attending: Hematology | Admitting: Hematology

## 2021-11-16 DIAGNOSIS — M4726 Other spondylosis with radiculopathy, lumbar region: Secondary | ICD-10-CM | POA: Diagnosis not present

## 2021-11-16 DIAGNOSIS — R32 Unspecified urinary incontinence: Secondary | ICD-10-CM | POA: Diagnosis not present

## 2021-11-16 DIAGNOSIS — Z5181 Encounter for therapeutic drug level monitoring: Secondary | ICD-10-CM | POA: Diagnosis not present

## 2021-11-16 DIAGNOSIS — C20 Malignant neoplasm of rectum: Secondary | ICD-10-CM | POA: Diagnosis not present

## 2021-11-16 DIAGNOSIS — M48061 Spinal stenosis, lumbar region without neurogenic claudication: Secondary | ICD-10-CM | POA: Diagnosis not present

## 2021-11-16 DIAGNOSIS — Z79891 Long term (current) use of opiate analgesic: Secondary | ICD-10-CM | POA: Diagnosis not present

## 2021-11-16 DIAGNOSIS — G894 Chronic pain syndrome: Secondary | ICD-10-CM | POA: Diagnosis not present

## 2021-11-16 DIAGNOSIS — M5136 Other intervertebral disc degeneration, lumbar region: Secondary | ICD-10-CM | POA: Diagnosis not present

## 2021-11-16 DIAGNOSIS — M47816 Spondylosis without myelopathy or radiculopathy, lumbar region: Secondary | ICD-10-CM | POA: Diagnosis not present

## 2021-11-29 DIAGNOSIS — N3946 Mixed incontinence: Secondary | ICD-10-CM | POA: Insufficient documentation

## 2021-11-29 DIAGNOSIS — R351 Nocturia: Secondary | ICD-10-CM | POA: Diagnosis not present

## 2021-11-29 DIAGNOSIS — Z6841 Body Mass Index (BMI) 40.0 and over, adult: Secondary | ICD-10-CM | POA: Diagnosis not present

## 2021-11-29 DIAGNOSIS — R8290 Unspecified abnormal findings in urine: Secondary | ICD-10-CM | POA: Diagnosis not present

## 2021-11-29 DIAGNOSIS — R350 Frequency of micturition: Secondary | ICD-10-CM | POA: Diagnosis not present

## 2021-12-17 DIAGNOSIS — N823 Fistula of vagina to large intestine: Secondary | ICD-10-CM | POA: Diagnosis not present

## 2021-12-17 DIAGNOSIS — Z6841 Body Mass Index (BMI) 40.0 and over, adult: Secondary | ICD-10-CM | POA: Diagnosis not present

## 2021-12-17 DIAGNOSIS — N3642 Intrinsic sphincter deficiency (ISD): Secondary | ICD-10-CM | POA: Diagnosis not present

## 2021-12-17 DIAGNOSIS — N3946 Mixed incontinence: Secondary | ICD-10-CM | POA: Diagnosis not present

## 2021-12-17 DIAGNOSIS — Z85048 Personal history of other malignant neoplasm of rectum, rectosigmoid junction, and anus: Secondary | ICD-10-CM | POA: Diagnosis not present

## 2021-12-17 DIAGNOSIS — N3289 Other specified disorders of bladder: Secondary | ICD-10-CM | POA: Diagnosis not present

## 2021-12-17 DIAGNOSIS — G8929 Other chronic pain: Secondary | ICD-10-CM | POA: Diagnosis not present

## 2021-12-17 DIAGNOSIS — R351 Nocturia: Secondary | ICD-10-CM | POA: Diagnosis not present

## 2021-12-17 DIAGNOSIS — N3010 Interstitial cystitis (chronic) without hematuria: Secondary | ICD-10-CM | POA: Diagnosis not present

## 2021-12-17 DIAGNOSIS — R350 Frequency of micturition: Secondary | ICD-10-CM | POA: Diagnosis not present

## 2021-12-27 ENCOUNTER — Ambulatory Visit (INDEPENDENT_AMBULATORY_CARE_PROVIDER_SITE_OTHER): Payer: Self-pay | Admitting: Podiatry

## 2021-12-27 DIAGNOSIS — Z91199 Patient's noncompliance with other medical treatment and regimen due to unspecified reason: Secondary | ICD-10-CM

## 2021-12-27 NOTE — Progress Notes (Signed)
No show

## 2022-01-07 DIAGNOSIS — N3281 Overactive bladder: Secondary | ICD-10-CM | POA: Insufficient documentation

## 2022-01-23 DIAGNOSIS — N3946 Mixed incontinence: Secondary | ICD-10-CM | POA: Diagnosis not present

## 2022-02-01 ENCOUNTER — Ambulatory Visit: Payer: Medicare HMO | Admitting: Podiatry

## 2022-02-15 ENCOUNTER — Other Ambulatory Visit: Payer: Self-pay

## 2022-02-15 ENCOUNTER — Encounter (HOSPITAL_COMMUNITY): Payer: Self-pay | Admitting: Internal Medicine

## 2022-02-15 ENCOUNTER — Encounter (HOSPITAL_COMMUNITY): Payer: Self-pay

## 2022-02-15 ENCOUNTER — Emergency Department (HOSPITAL_BASED_OUTPATIENT_CLINIC_OR_DEPARTMENT_OTHER): Payer: Medicare HMO | Admitting: Radiology

## 2022-02-15 ENCOUNTER — Emergency Department (HOSPITAL_BASED_OUTPATIENT_CLINIC_OR_DEPARTMENT_OTHER): Payer: Medicare HMO

## 2022-02-15 ENCOUNTER — Inpatient Hospital Stay (HOSPITAL_BASED_OUTPATIENT_CLINIC_OR_DEPARTMENT_OTHER)
Admission: EM | Admit: 2022-02-15 | Discharge: 2022-02-18 | DRG: 389 | Disposition: A | Discharge status: Home / Self Care | Payer: Medicare HMO | Attending: Internal Medicine | Admitting: Internal Medicine

## 2022-02-15 DIAGNOSIS — T451X5A Adverse effect of antineoplastic and immunosuppressive drugs, initial encounter: Secondary | ICD-10-CM | POA: Diagnosis present

## 2022-02-15 DIAGNOSIS — K56600 Partial intestinal obstruction, unspecified as to cause: Secondary | ICD-10-CM | POA: Diagnosis present

## 2022-02-15 DIAGNOSIS — R000 Tachycardia, unspecified: Secondary | ICD-10-CM | POA: Diagnosis not present

## 2022-02-15 DIAGNOSIS — Z800 Family history of malignant neoplasm of digestive organs: Secondary | ICD-10-CM | POA: Diagnosis not present

## 2022-02-15 DIAGNOSIS — R9389 Abnormal findings on diagnostic imaging of other specified body structures: Secondary | ICD-10-CM | POA: Diagnosis not present

## 2022-02-15 DIAGNOSIS — Z85048 Personal history of other malignant neoplasm of rectum, rectosigmoid junction, and anus: Secondary | ICD-10-CM

## 2022-02-15 DIAGNOSIS — Z87891 Personal history of nicotine dependence: Secondary | ICD-10-CM

## 2022-02-15 DIAGNOSIS — E7800 Pure hypercholesterolemia, unspecified: Secondary | ICD-10-CM | POA: Diagnosis present

## 2022-02-15 DIAGNOSIS — E872 Acidosis, unspecified: Secondary | ICD-10-CM

## 2022-02-15 DIAGNOSIS — D75839 Thrombocytosis, unspecified: Secondary | ICD-10-CM | POA: Diagnosis present

## 2022-02-15 DIAGNOSIS — K56609 Unspecified intestinal obstruction, unspecified as to partial versus complete obstruction: Secondary | ICD-10-CM | POA: Diagnosis present

## 2022-02-15 DIAGNOSIS — K5651 Intestinal adhesions [bands], with partial obstruction: Secondary | ICD-10-CM | POA: Diagnosis not present

## 2022-02-15 DIAGNOSIS — E876 Hypokalemia: Secondary | ICD-10-CM | POA: Diagnosis present

## 2022-02-15 DIAGNOSIS — Z803 Family history of malignant neoplasm of breast: Secondary | ICD-10-CM

## 2022-02-15 DIAGNOSIS — G620 Drug-induced polyneuropathy: Secondary | ICD-10-CM | POA: Diagnosis present

## 2022-02-15 DIAGNOSIS — N3946 Mixed incontinence: Secondary | ICD-10-CM | POA: Diagnosis present

## 2022-02-15 DIAGNOSIS — Z9221 Personal history of antineoplastic chemotherapy: Secondary | ICD-10-CM

## 2022-02-15 DIAGNOSIS — I10 Essential (primary) hypertension: Secondary | ICD-10-CM | POA: Diagnosis not present

## 2022-02-15 DIAGNOSIS — G894 Chronic pain syndrome: Secondary | ICD-10-CM | POA: Diagnosis present

## 2022-02-15 DIAGNOSIS — E66813 Obesity, class 3: Secondary | ICD-10-CM

## 2022-02-15 DIAGNOSIS — Z8249 Family history of ischemic heart disease and other diseases of the circulatory system: Secondary | ICD-10-CM

## 2022-02-15 DIAGNOSIS — R079 Chest pain, unspecified: Secondary | ICD-10-CM | POA: Diagnosis not present

## 2022-02-15 DIAGNOSIS — Z79899 Other long term (current) drug therapy: Secondary | ICD-10-CM

## 2022-02-15 DIAGNOSIS — C20 Malignant neoplasm of rectum: Secondary | ICD-10-CM | POA: Diagnosis present

## 2022-02-15 DIAGNOSIS — Z881 Allergy status to other antibiotic agents status: Secondary | ICD-10-CM

## 2022-02-15 DIAGNOSIS — Z808 Family history of malignant neoplasm of other organs or systems: Secondary | ICD-10-CM

## 2022-02-15 DIAGNOSIS — K219 Gastro-esophageal reflux disease without esophagitis: Secondary | ICD-10-CM

## 2022-02-15 DIAGNOSIS — M4646 Discitis, unspecified, lumbar region: Secondary | ICD-10-CM | POA: Diagnosis present

## 2022-02-15 DIAGNOSIS — N134 Hydroureter: Secondary | ICD-10-CM | POA: Diagnosis not present

## 2022-02-15 DIAGNOSIS — Z888 Allergy status to other drugs, medicaments and biological substances status: Secondary | ICD-10-CM

## 2022-02-15 DIAGNOSIS — N736 Female pelvic peritoneal adhesions (postinfective): Secondary | ICD-10-CM | POA: Diagnosis present

## 2022-02-15 DIAGNOSIS — E039 Hypothyroidism, unspecified: Secondary | ICD-10-CM | POA: Diagnosis not present

## 2022-02-15 DIAGNOSIS — Z6841 Body Mass Index (BMI) 40.0 and over, adult: Secondary | ICD-10-CM | POA: Diagnosis not present

## 2022-02-15 DIAGNOSIS — Z823 Family history of stroke: Secondary | ICD-10-CM

## 2022-02-15 DIAGNOSIS — F32A Depression, unspecified: Secondary | ICD-10-CM | POA: Diagnosis not present

## 2022-02-15 DIAGNOSIS — Z933 Colostomy status: Secondary | ICD-10-CM | POA: Diagnosis not present

## 2022-02-15 DIAGNOSIS — Z833 Family history of diabetes mellitus: Secondary | ICD-10-CM

## 2022-02-15 DIAGNOSIS — Z83438 Family history of other disorder of lipoprotein metabolism and other lipidemia: Secondary | ICD-10-CM

## 2022-02-15 DIAGNOSIS — R739 Hyperglycemia, unspecified: Secondary | ICD-10-CM

## 2022-02-15 DIAGNOSIS — N1330 Unspecified hydronephrosis: Secondary | ICD-10-CM | POA: Diagnosis not present

## 2022-02-15 DIAGNOSIS — R1084 Generalized abdominal pain: Secondary | ICD-10-CM | POA: Diagnosis not present

## 2022-02-15 DIAGNOSIS — Z885 Allergy status to narcotic agent status: Secondary | ICD-10-CM

## 2022-02-15 DIAGNOSIS — Z8719 Personal history of other diseases of the digestive system: Secondary | ICD-10-CM | POA: Diagnosis present

## 2022-02-15 DIAGNOSIS — G2581 Restless legs syndrome: Secondary | ICD-10-CM | POA: Diagnosis not present

## 2022-02-15 DIAGNOSIS — Z880 Allergy status to penicillin: Secondary | ICD-10-CM

## 2022-02-15 DIAGNOSIS — R1111 Vomiting without nausea: Secondary | ICD-10-CM | POA: Diagnosis not present

## 2022-02-15 DIAGNOSIS — F419 Anxiety disorder, unspecified: Secondary | ICD-10-CM | POA: Diagnosis present

## 2022-02-15 DIAGNOSIS — R109 Unspecified abdominal pain: Secondary | ICD-10-CM | POA: Diagnosis not present

## 2022-02-15 DIAGNOSIS — N823 Fistula of vagina to large intestine: Secondary | ICD-10-CM | POA: Diagnosis not present

## 2022-02-15 DIAGNOSIS — N3289 Other specified disorders of bladder: Secondary | ICD-10-CM | POA: Diagnosis not present

## 2022-02-15 DIAGNOSIS — Z923 Personal history of irradiation: Secondary | ICD-10-CM

## 2022-02-15 DIAGNOSIS — Z882 Allergy status to sulfonamides status: Secondary | ICD-10-CM

## 2022-02-15 LAB — COMPREHENSIVE METABOLIC PANEL
ALT: 10 U/L (ref 0–44)
AST: 9 U/L — ABNORMAL LOW (ref 15–41)
Albumin: 4.1 g/dL (ref 3.5–5.0)
Alkaline Phosphatase: 67 U/L (ref 38–126)
Anion gap: 14 (ref 5–15)
BUN: 18 mg/dL (ref 6–20)
CO2: 26 mmol/L (ref 22–32)
Calcium: 10.1 mg/dL (ref 8.9–10.3)
Chloride: 101 mmol/L (ref 98–111)
Creatinine, Ser: 0.94 mg/dL (ref 0.44–1.00)
GFR, Estimated: >60 mL/min (ref >60)
Glucose, Bld: 164 mg/dL — ABNORMAL HIGH (ref 70–99)
Potassium: 3.3 mmol/L — ABNORMAL LOW (ref 3.5–5.1)
Sodium: 141 mmol/L (ref 135–145)
Total Bilirubin: 0.6 mg/dL (ref 0.3–1.2)
Total Protein: 8.5 g/dL — ABNORMAL HIGH (ref 6.5–8.1)

## 2022-02-15 LAB — CBC WITH DIFFERENTIAL/PLATELET
Abs Immature Granulocytes: 0.04 10*3/uL (ref 0.00–0.07)
Basophils Absolute: 0 10*3/uL (ref 0.0–0.1)
Basophils Relative: 0 %
Eosinophils Absolute: 0 10*3/uL (ref 0.0–0.5)
Eosinophils Relative: 0 %
HCT: 42.3 % (ref 36.0–46.0)
Hemoglobin: 13.3 g/dL (ref 12.0–15.0)
Immature Granulocytes: 1 %
Lymphocytes Relative: 15 %
Lymphs Abs: 1.3 10*3/uL (ref 0.7–4.0)
MCH: 25.4 pg — ABNORMAL LOW (ref 26.0–34.0)
MCHC: 31.4 g/dL (ref 30.0–36.0)
MCV: 80.9 fL (ref 80.0–100.0)
Monocytes Absolute: 0.7 10*3/uL (ref 0.1–1.0)
Monocytes Relative: 8 %
Neutro Abs: 6.7 10*3/uL (ref 1.7–7.7)
Neutrophils Relative %: 76 %
Platelets: 455 10*3/uL — ABNORMAL HIGH (ref 150–400)
RBC: 5.23 MIL/uL — ABNORMAL HIGH (ref 3.87–5.11)
RDW: 15.5 % (ref 11.5–15.5)
WBC: 8.8 10*3/uL (ref 4.0–10.5)
nRBC: 0 % (ref 0.0–0.2)

## 2022-02-15 LAB — LACTIC ACID, PLASMA
Lactic Acid, Venous: 2.6 mmol/L (ref 0.5–1.9)
Lactic Acid, Venous: 1.8 mmol/L (ref 0.5–1.9)
Lactic Acid, Venous: 2 mmol/L (ref 0.5–1.9)

## 2022-02-15 LAB — PROTIME-INR
INR: 1 (ref 0.8–1.2)
Prothrombin Time: 13.1 seconds (ref 11.4–15.2)

## 2022-02-15 LAB — PROCALCITONIN: Procalcitonin: <0.1 ng/mL

## 2022-02-15 LAB — MAGNESIUM: Magnesium: 1.7 mg/dL (ref 1.7–2.4)

## 2022-02-15 LAB — HEMOGLOBIN A1C
Hgb A1c MFr Bld: 6.1 % — ABNORMAL HIGH (ref 4.8–5.6)
Mean Plasma Glucose: 128.37 mg/dL

## 2022-02-15 MED ORDER — SODIUM CHLORIDE 0.9 % IV SOLN
25.0000 mg | Freq: Once | INTRAVENOUS | Status: AC
  Administered 2022-02-15: 25 mg via INTRAVENOUS
  Filled 2022-02-15: qty 1

## 2022-02-15 MED ORDER — LACTATED RINGERS IV SOLN: INTRAVENOUS | Status: DC

## 2022-02-15 MED ORDER — LACTATED RINGERS IV BOLUS
1000.0000 mL | Freq: Once | INTRAVENOUS | Status: AC
  Administered 2022-02-15: 1000 mL via INTRAVENOUS

## 2022-02-15 MED ORDER — MORPHINE SULFATE (PF) 2 MG/ML IV SOLN
2.0000 mg | INTRAVENOUS | Status: DC | PRN
  Administered 2022-02-15 – 2022-02-18 (×12): 2 mg via INTRAVENOUS
  Filled 2022-02-15 (×12): qty 1

## 2022-02-15 MED ORDER — IOHEXOL 300 MG/ML  SOLN
100.0000 mL | Freq: Once | INTRAMUSCULAR | Status: AC | PRN
  Administered 2022-02-15: 100 mL via INTRAVENOUS

## 2022-02-15 MED ORDER — HYDRALAZINE HCL 20 MG/ML IJ SOLN: 10.0000 mg | Freq: Three times a day (TID) | INTRAMUSCULAR | Status: DC | PRN

## 2022-02-15 MED ORDER — MORPHINE SULFATE (PF) 4 MG/ML IV SOLN
4.0000 mg | Freq: Once | INTRAVENOUS | Status: AC
  Administered 2022-02-15: 4 mg via INTRAVENOUS
  Filled 2022-02-15: qty 1

## 2022-02-15 MED ORDER — POTASSIUM CHLORIDE 10 MEQ/100ML IV SOLN
10.0000 meq | INTRAVENOUS | Status: AC
  Administered 2022-02-15 (×4): 10 meq via INTRAVENOUS
  Filled 2022-02-15 (×3): qty 100

## 2022-02-15 MED ORDER — PROCHLORPERAZINE EDISYLATE 10 MG/2ML IJ SOLN
10.0000 mg | Freq: Four times a day (QID) | INTRAMUSCULAR | Status: DC | PRN
Start: 2022-02-15 — End: 2022-02-16
  Administered 2022-02-15 – 2022-02-16 (×3): 10 mg via INTRAVENOUS
  Filled 2022-02-15 (×3): qty 2

## 2022-02-15 MED ORDER — ENOXAPARIN SODIUM 60 MG/0.6ML IJ SOSY
60.0000 mg | PREFILLED_SYRINGE | INTRAMUSCULAR | Status: DC
  Administered 2022-02-15: 60 mg via SUBCUTANEOUS
  Filled 2022-02-15: qty 0.6

## 2022-02-15 MED ORDER — MELATONIN 3 MG PO TABS
6.0000 mg | ORAL_TABLET | Freq: Every day | ORAL | Status: DC
  Administered 2022-02-15 – 2022-02-17 (×3): 6 mg via ORAL
  Filled 2022-02-15 (×3): qty 2

## 2022-02-15 MED ORDER — FAMOTIDINE IN NACL 20-0.9 MG/50ML-% IV SOLN
20.0000 mg | Freq: Two times a day (BID) | INTRAVENOUS | Status: DC
  Administered 2022-02-15 – 2022-02-17 (×5): 20 mg via INTRAVENOUS
  Filled 2022-02-15 (×5): qty 50

## 2022-02-15 MED ORDER — PROMETHAZINE HCL 25 MG/ML IJ SOLN
INTRAMUSCULAR | Status: AC
  Filled 2022-02-15: qty 1

## 2022-02-15 NOTE — Consult Note (Signed)
Lindsay Little [birth year]  [id number].    Requesting MD: Dr. Cherylann Ratel Chief Complaint/Reason for Consult: psbo  HPI:  This is a 56 yo white female with a history of colon cancer s/p robotic LAR/DLI and takedown followed by a leak and colovaginal fistula ultimately resulting in an APR type anatomy with an end colostomy.  She then had a sugarbaker style parastomal hernia repair with a 35x27cm piece of meshed placed all by Dr. Johney Maine.  She has subsequently had chemotherapy and radiation therapy to her pelvis.  She was released from the care of Dr. Burr Medico about 1.5 years ago with resolution of her cancer.  She was admitted in April of 2022 with a psbo that resolved on it's own with no surgical intervention.    On Wednesday night she started to develop some abdominal pain along with N/V.  She took phenergan at home but was unable to keep this down.  She has continued to have good colostomy output.  She has had some low grade temps in the 99 range.  She presented to the ED at [facility] for evaluation.  Her WBC was found to be normal with most other labs relatively normal as well except her K was slightly low at 3.3.  she then underwent a CT scan of her abdomen/pel which revealed: "1. Partial small bowel obstruction with transition point in the pelvis, most likely due to adhesions. 2. Development of bilateral hydroureteronephrosis, followed to the level of the pelvis. Possibly related to scarring in this patient who is status post abdominal perineal resection for presumed rectal carcinoma. However, given below findings, residual or recurrent disease is possible. 3. Subtly progressive presacral soft tissue thickening with areas of small volume gas within. Although findings could represent fistulous communication to bowel and resultant inflammation, local recurrence cannot be excluded. Consider further evaluation with PET. 4. Enlargement of small pelvic nodes, reactive or indicative of early  metastasis."  Since she has been started on IVFs her nausea,vomiting, and abdominal pain have subsided.  She has emptied her colostomy bag twice today with thick pudding like stool which is normal for her.  She has been admitted by the medical service for further treatment of her urologic findings.  We have been asked to see her for the psbo finding on her CT scan.     ROS: ROS: see HPI  Family History  Problem Relation Age of Onset   Coronary artery disease Mother 13   Hypertension Mother    Hyperlipidemia Mother    Diabetes Mellitus I Mother    Coronary artery disease Father    Hyperlipidemia Father    Hypertension Father    Cancer Sister        skin - non melanoma   Hyperlipidemia Brother    Cancer Maternal Uncle 10       pancreatic with mets to colon and prostate   Cirrhosis Maternal Uncle    Hypertension Maternal Grandmother    Diabetes Mellitus I Maternal Grandmother    Hyperlipidemia Maternal Grandmother    CVA Maternal Grandmother    Hypertension Maternal Grandfather    Coronary artery disease Maternal Grandfather 62   Hyperlipidemia Maternal Grandfather    Coronary artery disease Paternal Grandmother    Hypertension Paternal Grandmother    Hyperlipidemia Paternal Grandmother    Diabetes Mellitus I Paternal Grandmother    Hypertension Paternal Grandfather    Hyperlipidemia Paternal Grandfather    Coronary artery disease Paternal Grandfather     Past  Medical History:  Diagnosis Date   Anxiety    Chemotherapy-induced neuropathy (HCC)    toes and fingers numbness and tingling   Colorectal delayed anastomotic leak s/p resection & colostomy 07/12/2016 11/17/2015   Colostomy in place [facility]) 07/12/2016   due to anastomosis breakdown w/ colovaginal fistula   Colovaginal fistula s/p omentopexy repair 07/12/2016 40/98/1191   Complication of anesthesia    Depression    Family history of adverse reaction to anesthesia    mother-- ponv   Family history of breast cancer     Family history of pancreatic cancer    Family history of skin cancer    Gastroenteritis 12/20/2015   GERD (gastroesophageal reflux disease)    Hardware complicating wound infection ([place]) 08/03/2019   Hiatal hernia    History of cancer chemotherapy 02-20-2015 to 03-30-2015   History of cardiac murmur as a child    History of chemotherapy    History of chronic gastritis    History of hypertension    no issues since multiple abdminal sx's and chemo--- no medication since 12/ 2016   History of TMJ disorder    Hypercholesteremia    Hypertension    IBS (irritable bowel syndrome)    dx age 56   Intermittent abdominal pain    post-op multiple abdominal sx's    Microcytic anemia    Mild sleep apnea    per pt study 2014  very mild osa , no cpap recommended, recommeded wt loss and sleep routine   OA (osteoarthritis)    left knee /  left shoulder   Obesity    PONV (postoperative nausea and vomiting)    severe" needs Scopolamine PATCH"    Portacath in place    right chest   Rectal adenocarcinoma ([place]) oncologis-  dr Burr Medico-- after radiation/ chemo (ypT1, N0) --  no recurrence per last note 03/ 2018   dx 01-13-2015-- Stage IIIC (T3, N2, M0) post concurrent radiation and chemotherapy 02-20-2015 to 03-30-2015 /  05-25-2015 s/p  LAR w/ RSO (post-op complicated by late abscess and contained anatomotic leak w/ help percutaneous drainage and antibiotics)    Rotator cuff tear, left    S/P radiation therapy 02/20/15-03/30/15   colon/rectal   Vitamin D deficiency    Wears glasses    Wears glasses     Past Surgical History:  Procedure Laterality Date   ABDOMINAL HYSTERECTOMY  1996   uterus and cervix   BACK SURGERY  02/12/2018   lumbar surgery   COLON RESECTION N/A 07/12/2016   Procedure: LAPAROSCOPIC LYSIS OF ADHESIONS, OMENTOPEXY, HAND ASSISTED RESECTION OF  COLON, END TO END ANASTOMOSIS, COLOSTOMY;  Surgeon: Michael Boston, MD;  Location: [facility];  Service: General;  Laterality: N/A;   COMBINED  HYSTEROSCOPY DIAGNOSTIC / D&C  x2 1990's   DIAGNOSTIC LAPAROSCOPY  age 56 and age 47   EUS N/A 01/18/2015   Procedure: LOWER ENDOSCOPIC ULTRASOUND (EUS);  Surgeon: Arta Silence, MD;  Location: [facility];  Service: Endoscopy;  Laterality: N/A;   EXCISION OF SKIN TAG  11/17/2015   Procedure: EXCISION OF SKIN TAG;  Surgeon: Michael Boston, MD;  [facility]: [facility];  Service: General;;   ILEO LOOP COLOSTOMY CLOSURE N/A 11/17/2015   Procedure: LAPAROSCOPIC DIVERTING LOOP ILEOSTOMY  DRAINAGE OF PELVIC ABSCESS;  Surgeon: Michael Boston, MD;  [facility]: [facility];  Service: General;  Laterality: N/A;   ILEOSTOMY CLOSURE N/A 05/31/2016   Procedure: TAKEDOWN LOOP ILEOSTOMY;  Surgeon: Michael Boston, MD;  [facility];  Service: General;  Laterality: N/A;   IMPACTION REMOVAL  11/17/2015   Procedure: DISIMPACTION REMOVAL;  Surgeon: Michael Boston, MD;  [facility];  Service: General;;   INSERTION OF MESH N/A 07/23/2018   Procedure: INSERTION OF MESH X2;  Surgeon: Michael Boston, MD;  [facility];  Service: General;  Laterality: N/A;   IR LUMBAR [place] W/IMG GUIDE  07/01/2019   KNEE ARTHROSCOPY Left 1990's   KNEE ARTHROSCOPY W/ MENISCECTOMY Left 09/14/2009   and chondroplasty debridement   LAPAROSCOPIC LYSIS OF ADHESIONS  11/17/2015   Procedure: LAPAROSCOPIC LYSIS OF ADHESIONS;  Surgeon: Michael Boston, MD;  [facility];  Service: General;;   LUMBAR WOUND DEBRIDEMENT N/A 04/24/2018   Procedure: wound exploration, irrigation and debridement;  Surgeon: Eustace Moore, MD;  Location: [place];  Service: Neurosurgery;  Laterality: N/A;  wound exploration, irrigation and debridement   LYSIS OF ADHESION N/A 07/23/2018   Procedure: LYSIS OF ADHESIONS;  Surgeon: Michael Boston, MD;  [facility];  Service: General;  Laterality: N/A;   PORT-A-CATH REMOVAL N/A 11/21/2016   Procedure: REMOVAL PORT-A-CATH;  Surgeon: Michael Boston, MD;  Location: [place];  Service: General;  Laterality: N/A;    PORTACATH PLACEMENT N/A 05/25/2015   Procedure: INSERTION PORT-A-CATH;  Surgeon: Michael Boston, MD;  [facility];  Service: General;  Laterality: N/A;-remains inplace Right chest.   ROTATOR CUFF REPAIR Right 2006   TONSILLECTOMY  age 52   [place] N/A 07/23/2018   Procedure: [place];  Surgeon: Michael Boston, MD;  [facility];  Service: General;  Laterality: N/A;   XI ROBOTIC ASSISTED LOWER ANTERIOR RESECTION N/A 05/25/2015   Procedure: XI ROBOTIC ASSISTED LOWER ANTERIOR RESECTION, , RIGID PROCTOSCOPY, RIGHT OOPHORECTOMY;  Surgeon: Michael Boston, MD;  [facility];  Service: General;  Laterality: N/A;    Social History:  reports that she quit smoking about 30 years ago. Her smoking use included cigarettes. She has a 1.75 pack-year smoking history. She has never used smokeless tobacco. She reports that she does not drink alcohol and does not use drugs.  Allergies:  Allergies  Allergen Reactions   Caine-1 [Lidocaine] Swelling and Rash    Eyes swell shut; includes all caine drugs except marcaine. EMLA cream OK though (?!)   Bupropion Other (See Comments)    Not effective per Pt.    Sulfa Antibiotics Nausea And Vomiting and Rash   Pantoprazole Other (See Comments)    Not effective per Pt.    Adhesive [Tape] Rash and Other (See Comments)    Blisters - can use paper tape   Doxycycline Nausea Only   Flagyl [Metronidazole] Nausea Only   Iron Nausea Only   Metoprolol Nausea Only and Palpitations   Oxycodone Other (See Comments)    NIGHTMARES. (tolerates hydrocodone or tramadol better)   Penicillins Nausea Only and Rash    Has patient had a PCN reaction causing immediate rash, facial/tongue/throat swelling, SOB or lightheadedness with hypotension: no Has patient had a PCN reaction causing severe rash involving mucus membranes or skin necrosis: no Has patient had a PCN reaction that required hospitalization no Has patient had a PCN reaction  occurring within the last 10 years: no If all of the above answers are "NO", then may proceed with Cephalosporin use.     Medications Prior to Admission  Medication Sig Dispense Refill   acetaminophen (TYLENOL) 500 MG tablet Take 1,000 mg by mouth as needed (pain).     bisoprolol (ZEBETA)  10 MG tablet Take 10 mg by mouth daily.     celecoxib (CELEBREX) 100 MG capsule Take 100 mg by mouth 2 (two) times daily.     Cholecalciferol (VITAMIN D3) 5000 units CAPS Take 5,000 Units by mouth at bedtime.      Coenzyme Q10 (COQ10) 200 MG CAPS Take 200 mg by mouth at bedtime.     DULoxetine (CYMBALTA) 60 MG capsule Take 120 mg by mouth daily.     esomeprazole (NEXIUM) 40 MG capsule Take 1 capsule (40 mg total) by mouth 2 (two) times daily before a meal. 60 capsule 5   furosemide (LASIX) 20 MG tablet Take 20 mg by mouth daily as needed for edema.      HYDROmorphone (DILAUDID) 4 MG tablet Take 4 mg by mouth every 6 (six) hours as needed (breakthrough pain).     levETIRAcetam (KEPPRA) 250 MG tablet Take 250 mg by mouth 2 (two) times daily.     levothyroxine (SYNTHROID) 50 MCG tablet Take 50 mcg by mouth every morning.     Melatonin 3 MG TABS Take 3-6 mg by mouth at bedtime as needed (sleep).     morphine (MS CONTIN) 15 MG 12 hr tablet Take 15 mg by mouth 2 (two) times daily.     Multiple Vitamin (MULTIVITAMIN WITH MINERALS) TABS tablet Take 2 tablets by mouth at bedtime.      oxybutynin (DITROPAN-XL) 10 MG 24 hr tablet Take 10 mg by mouth daily.     polyethylene glycol (MIRALAX / GLYCOLAX) packet Take 17 g by mouth daily as needed for moderate constipation.     pregabalin (LYRICA) 225 MG capsule Take 225 mg by mouth 2 (two) times daily.     promethazine (PHENERGAN) 25 MG tablet TAKE ONE TABLET BY MOUTH EVERY 6 HOURS AS NEEDED FOR NAUSEA OR VOMITING (Patient taking differently: Take 25 mg by mouth every 6 (six) hours as needed for nausea or vomiting.) 60 tablet 0   rosuvastatin (CRESTOR) 5 MG tablet Take 5  mg by mouth at bedtime.     triamcinolone lotion (KENALOG) 0.1 % Apply 1 application topically as needed (dry skin).     gabapentin (NEURONTIN) 800 MG tablet Take 800 mg by mouth 3 (three) times daily. (Patient not taking: Reported on 02/15/2022)     HYDROcodone-acetaminophen (NORCO) 10-325 MG tablet Take 1 tablet by mouth every 8 (eight) hours as needed for severe pain. (Patient not taking: Reported on 02/15/2022) 60 tablet 0     Physical Exam: Blood pressure (!) 135/92, pulse 91, temperature 98.9 F (37.2 C), temperature source Oral, resp. rate 20, SpO2 94 %. General: pleasant, WD, WN, obese white female who is laying in bed in NAD HEENT: head is normocephalic, atraumatic.  Sclera are noninjected.  PERRL.  Ears and nose without any masses or lesions.  Mouth is pink and moist Heart: regular, rate, and rhythm.  Normal s1,s2. No obvious murmurs, gallops, or rubs noted.  Palpable radial and pedal pulses bilaterally Lungs: CTAB, no wheezes, rhonchi, or rales noted.  Respiratory effort nonlabored Abd: soft, NT, ND/obese, +BS, LUQ colostomy with viable stoma and a lot of soft pudding like stool present, no masses, hernias, or organomegaly Psych: A&Ox3 with an appropriate affect.   Results for orders placed or performed during the hospital encounter of 02/15/22 (from the past 48 hour(s))  Comprehensive metabolic panel     Status: Abnormal   Collection Time: 02/15/22  3:50 AM  Result Value Ref Range   Sodium 141  135 - 145 mmol/L   Potassium 3.3 (L) 3.5 - 5.1 mmol/L   Chloride 101 98 - 111 mmol/L   CO2 26 22 - 32 mmol/L   Glucose, Bld 164 (H) 70 - 99 mg/dL    Comment: Glucose reference range applies only to samples taken after fasting for at least 8 hours.   BUN 18 6 - 20 mg/dL   Creatinine, Ser 0.94 0.44 - 1.00 mg/dL   Calcium 10.1 8.9 - 10.3 mg/dL   Total Protein 8.5 (H) 6.5 - 8.1 g/dL   Albumin 4.1 3.5 - 5.0 g/dL   AST 9 (L) 15 - 41 U/L   ALT 10 0 - 44 U/L   Alkaline Phosphatase 67 38 - 126  U/L   Total Bilirubin 0.6 0.3 - 1.2 mg/dL   GFR, Estimated >60 >60 mL/min    Comment: (NOTE) Calculated using the CKD-EPI Creatinine Equation (2021)    Anion gap 14 5 - 15    Comment: Performed at [facility], [place], [place] [address]  Lactic acid, plasma     Status: Abnormal   Collection Time: 02/15/22  3:50 AM  Result Value Ref Range   Lactic Acid, Venous 2.6 (HH) 0.5 - 1.9 mmol/L    Comment: CRITICAL RESULT CALLED TO, READ BACK BY AND VERIFIED WITH: KATIE WATLINGTON 02/15/22 AT 0434 HS Performed at [facility], [address], [place], [place] [address]   CBC with Differential     Status: Abnormal   Collection Time: 02/15/22  3:50 AM  Result Value Ref Range   WBC 8.8 4.0 - 10.5 K/uL   RBC 5.23 (H) 3.87 - 5.11 MIL/uL   Hemoglobin 13.3 12.0 - 15.0 g/dL   HCT 42.3 36.0 - 46.0 %   MCV 80.9 80.0 - 100.0 fL   MCH 25.4 (L) 26.0 - 34.0 pg   MCHC 31.4 30.0 - 36.0 g/dL   RDW 15.5 11.5 - 15.5 %   Platelets 455 (H) 150 - 400 K/uL   nRBC 0.0 0.0 - 0.2 %   Neutrophils Relative % 76 %   Neutro Abs 6.7 1.7 - 7.7 K/uL   Lymphocytes Relative 15 %   Lymphs Abs 1.3 0.7 - 4.0 K/uL   Monocytes Relative 8 %   Monocytes Absolute 0.7 0.1 - 1.0 K/uL   Eosinophils Relative 0 %   Eosinophils Absolute 0.0 0.0 - 0.5 K/uL   Basophils Relative 0 %   Basophils Absolute 0.0 0.0 - 0.1 K/uL   Immature Granulocytes 1 %   Abs Immature Granulocytes 0.04 0.00 - 0.07 K/uL    Comment: Performed at [facility], [address] [place], [place] [address]  Protime-INR     Status: None   Collection Time: 02/15/22  3:50 AM  Result Value Ref Range   Prothrombin Time 13.1 11.4 - 15.2 seconds   INR 1.0 0.8 - 1.2    Comment: (NOTE) INR goal varies based on device and disease states. Performed at [facility], [address] [place], [place] [address]   Culture, blood (Routine x 2)     Status: None (Preliminary  result)   Collection Time: 02/15/22  3:50 AM   Specimen: BLOOD  Result Value Ref Range   Specimen Description      BLOOD RIGHT ANTECUBITAL BOTTLES DRAWN AEROBIC AND ANAEROBIC Blood Culture adequate volume Performed at [facility], [address] [place], [place] [address]    Special Requests  NONE Performed at [facility], [address], [place], [place] [address]    Culture      NO GROWTH <12 HOURS Performed at [facility] [address] [place], [place] [address]    Report Status PENDING   Lactic acid, plasma     Status: None   Collection Time: 02/15/22  7:05 AM  Result Value Ref Range   Lactic Acid, Venous 1.8 0.5 - 1.9 mmol/L    Comment: Performed at [facility], [place], [place] [address]  Lactic acid, plasma     Status: Abnormal   Collection Time: 02/15/22  1:22 PM  Result Value Ref Range   Lactic Acid, Venous 2.0 (HH) 0.5 - 1.9 mmol/L    Comment: CRITICAL RESULT CALLED TO, READ BACK BY AND VERIFIED WITH [place], B @ [place] Performed at [facility], [place] [address], [place], [place] [address]    CT ABDOMEN PELVIS W CONTRAST  Result Date: 02/15/2022 CLINICAL DATA:  Abdominal pain since Wednesday. History of Crohn disease. Prior abdominal perineal resection for rectal cancer. Suspicion of bowel obstruction. * Tracking Code: BO * EXAM: CT ABDOMEN AND PELVIS WITH CONTRAST TECHNIQUE: Multidetector CT imaging of the abdomen and pelvis was performed using the standard protocol following bolus administration of intravenous contrast. RADIATION DOSE REDUCTION: This exam was performed according to the departmental dose-optimization program which includes automated exposure control, adjustment of the mA and/or kV according to patient size and/or use of iterative reconstruction technique. CONTRAST:  140m OMNIPAQUE IOHEXOL 300 MG/ML  SOLN COMPARISON:  10/12/2020  FINDINGS: Lower chest: Clear lung bases. Normal heart size without pericardial or pleural effusion. Hepatobiliary: Focal steatosis adjacent the falciform ligament. Normal gallbladder, without biliary ductal dilatation. Pancreas: Normal, without mass or ductal dilatation. Spleen: Normal in size, without focal abnormality. Adrenals/Urinary Tract: Normal adrenal glands. Development of moderate bilateral hydroureteronephrosis. Followed to the level of the mid pelvis. The bladder is mildly thick walled. Stomach/Bowel: Normal stomach, without wall thickening. Descending colostomy consistent with prior abdominal perineal resection. Small bowel loops are dilated and fluid-filled including up to 4.0 cm on 55/2. Suspect a pelvic transition including on 69 through 61 of series 2. No obstructive mass identified. No complicating ischemia. Vascular/Lymphatic: Aortic atherosclerosis. Right common iliac node measures 9 mm on 67/2 and is new. Left common iliac node measures 9 mm on 62/2 and is significantly enlarged since the prior. Reproductive: Prior hysterectomy per report. Left adnexal cystic lesions of up to 2.5 cm including on 79/2, slightly increased compared to 10/12/2020. Other: Presacral soft tissue and fluid density including on 83/2. trace gas within on 85/2. Especially when comparing sagittal image 80 to the prior exam, suspect increased caudal extension. Foci of gas also identified within more cephalad extension of presacral soft tissue thickening, including on 76/2, new. No free intraperitoneal air. No evidence of omental or peritoneal disease. Musculoskeletal: L4-5 trans pedicle screw fixation. Advanced degenerate disc disease at this level as well as L3-4. IMPRESSION: 1. Partial small bowel obstruction with transition point in the pelvis, most likely due to adhesions. 2. Development of bilateral hydroureteronephrosis, followed to the level of the pelvis. Possibly related to scarring in this patient who is status  post abdominal perineal resection for presumed rectal carcinoma. However, given below findings, residual or recurrent disease is possible. 3. Subtly progressive presacral soft tissue thickening with areas of small volume gas within. Although findings could represent fistulous communication to bowel and resultant inflammation, local  recurrence cannot be excluded. Consider further evaluation with PET. 4. Enlargement of small pelvic nodes, reactive or indicative of early metastasis. 5. Enlargement of left pelvic/adnexal cystic lesions, possibly residual ovarian follicles or cysts versus postoperative entities such as peritoneal inclusion cysts. 6. Mildly thick walled bladder, possibly representing cystitis. 7.  Aortic Atherosclerosis (ICD10-I70.0). Electronically Signed   By: Abigail Miyamoto M.D.   On: 02/15/2022 05:26   DG Chest Port 1 View  Result Date: 02/15/2022 CLINICAL DATA:  Abdominal pain. EXAM: PORTABLE CHEST 1 VIEW COMPARISON:  02/03/2018. FINDINGS: The heart size and mediastinal contours are within normal limits. There is atherosclerotic calcification of the aorta. No consolidation, effusion, or pneumothorax. No acute osseous abnormality. IMPRESSION: No active disease. Electronically Signed   By: Brett Fairy M.D.   On: 02/15/2022 03:49      Assessment/Plan pSBO The patient has been seen, examined, chart, labs, vitals, and imaging personally reviewed.  Her CT scan is concerning for a psbo as were her initial presenting symptoms.  However, her N/V/abdominal pain have resolved with hydration.  Her colostomy bag is working well.  I think it is reasonable to treat her clinically and try some clear liquids and see how she does.  If she were to be unable to tolerate this, then we can decrease her intake; however, if she tolerates then we can likely advance her diet as she tolerates.  FEN - CLD/IVFs per medicine VTE - ok from surgical standpoint ID - none needed from surgical standpoint  H/O colorectal  cancer CT scan concerning for presacral soft tissue thickening possibly secondary to recurrence vs possible fistulous communication to the bowel with thickening from inflammation.  She will likely need to follow up with Dr. Burr Medico as an outpatient for a PET scan to better evaluate these findings. B hydroureteronephrosis - per medicine/urology HTN HLD Obesity   I reviewed ED provider notes, hospitalist notes, last 24 h vitals and pain scores, last 48 h intake and output, last 24 h labs and trends, and last 24 h imaging results.  Henreitta Cea, PA-C [facility] 02/15/2022, 3:00 PM Please see Amion for pager number during day hours 7:00am-4:30pm or 7:00am -11:30am on weekends

## 2022-02-15 NOTE — ED Triage Notes (Signed)
Patient bib [place] ems form home,  C/o abdominal pain since Wednesday. Hx GI CA with colostomy, reports stool is more watery than normal. VS per EMS: 142/115 80 16 94% RA

## 2022-02-15 NOTE — ED Triage Notes (Signed)
Vomit x 3 days, staes she is unable to change her colostomy bag. Asking for fluids and phenergan

## 2022-02-15 NOTE — ED Notes (Signed)
Date and time results received: 02/15/22 0434 (use smartphrase ".now" to insert current time)  Test: lactic acid Critical Value: 2.6  Name of Provider Notified: Peggye Pitt, MD  Orders Received? Or Actions Taken?:  n/a

## 2022-02-15 NOTE — ED Notes (Signed)
Unable to get second set of blood cultures.

## 2022-02-15 NOTE — Progress Notes (Signed)
Date and time results received: 02/15/22 1453 (use smartphrase ".now" to insert current time)  Test: lactic acid Critical Value: 2.0  Name of Provider Notified: Cherylann Ratel  Orders Received? Or Actions Taken?: see ordered. Orders Received - See Orders for details

## 2022-02-15 NOTE — ED Provider Notes (Signed)
[facility] Provider Note   CSN: [id number] Arrival date & time: 02/15/22  0219     History  Chief Complaint  Patient presents with   Abdominal Pain    Lindsay Little is a 56 y.o. female.  The history is provided by the patient.  Abdominal Pain She has history of hypertension, hyperlipidemia, rectal cancer status post colostomy, colovaginal fistula and comes in with vomiting for the last 3 days.  There has also been upper abdominal pain.  She has run low-grade fevers as high as 99.7.  She denies chills or sweats.  She has tried taking promethazine, but vomited after taking it.   Home Medications Prior to Admission medications   Medication Sig Start Date End Date Taking? Authorizing Provider  bisoprolol (ZEBETA) 10 MG tablet Take 10 mg by mouth daily. 11/30/19   [provider]  celecoxib (CELEBREX) 100 MG capsule Take 100 mg by mouth 2 (two) times daily. 12/12/20   [provider]  Cholecalciferol (VITAMIN D3) 5000 units CAPS Take 5,000 Units by mouth at bedtime.     [provider]  Coenzyme Q10 (COQ10) 200 MG CAPS Take 200 mg by mouth at bedtime.    [provider]  DULoxetine (CYMBALTA) 60 MG capsule Take 120 mg by mouth at bedtime.     [provider]  esomeprazole (NEXIUM) 40 MG capsule Take 1 capsule (40 mg total) by mouth 2 (two) times daily before a meal. 12/22/15   Michael Boston, MD  furosemide (LASIX) 20 MG tablet Take 20 mg by mouth daily as needed for edema.     [provider]  gabapentin (NEURONTIN) 800 MG tablet Take 800 mg by mouth 3 (three) times daily. 12/23/19   [provider]  HYDROcodone-acetaminophen (NORCO) 10-325 MG tablet Take 1 tablet by mouth every 8 (eight) hours as needed for severe pain. 08/25/19   Truitt Merle, MD  levETIRAcetam (KEPPRA) 250 MG tablet Take 250 mg by mouth 2 (two) times daily.    [provider]  Melatonin 3 MG TABS Take 3-6 mg by mouth at  bedtime.     [provider]  Multiple Vitamin (MULTIVITAMIN WITH MINERALS) TABS tablet Take 2 tablets by mouth at bedtime.     [provider]  polyethylene glycol (MIRALAX / GLYCOLAX) packet Take 17 g by mouth daily as needed for moderate constipation.    [provider]  promethazine (PHENERGAN) 25 MG tablet TAKE ONE TABLET BY MOUTH EVERY 6 HOURS AS NEEDED FOR NAUSEA OR VOMITING 07/04/20   Tommy Medal, Lavell Islam, MD  rosuvastatin (CRESTOR) 5 MG tablet Take 5 mg by mouth at bedtime.    [provider]  triamcinolone lotion (KENALOG) 0.1 % Apply 1 application topically as needed (dry skin).    [provider]      Allergies    Caine-1 [lidocaine], Sulfa antibiotics, Adhesive [tape], Doxycycline, Flagyl [metronidazole], Iron, Oxycodone, and Penicillins    Review of Systems   Review of Systems  Gastrointestinal:  Positive for abdominal pain.  All other systems reviewed and are negative.   Physical Exam Updated Vital Signs Pulse (!) 151   Temp 98.2 F (36.8 C) (Oral)   Resp 20   SpO2 100%  Physical Exam Vitals and nursing note reviewed.   56 year old female, appears uncomfortable, but is in no acute distress. Vital signs are significant for rapid heart rate. Oxygen saturation is 100%, which is normal. Head is normocephalic and atraumatic. PERRLA,  EOMI. Oropharynx is clear. Neck is nontender and supple without adenopathy or JVD. Back is nontender and there is no CVA tenderness. Lungs are clear without rales, wheezes, or rhonchi. Chest is nontender. Heart has regular rate and rhythm without murmur. Abdomen is soft, flat, with moderate epigastric tenderness.  There is no rebound or guarding.  Colostomy is present in left lower quadrant with appropriate drainage.  Peristalsis is diminished. Extremities have no cyanosis or edema, full range of motion is present. Skin is warm and dry without rash. Neurologic: Mental status is normal, cranial  nerves are intact, moves all extremities equally.  ED Results / Procedures / Treatments   Labs (all labs ordered are listed, but only abnormal results are displayed) Labs Reviewed  COMPREHENSIVE METABOLIC PANEL - Abnormal; Notable for the following components:      Result Value   Potassium 3.3 (*)    Glucose, Bld 164 (*)    Total Protein 8.5 (*)    AST 9 (*)    All other components within normal limits  LACTIC ACID, PLASMA - Abnormal; Notable for the following components:   Lactic Acid, Venous 2.6 (*)    All other components within normal limits  CBC WITH DIFFERENTIAL/PLATELET - Abnormal; Notable for the following components:   RBC 5.23 (*)    MCH 25.4 (*)    Platelets 455 (*)    All other components within normal limits  CULTURE, BLOOD (ROUTINE X 2)  CULTURE, BLOOD (ROUTINE X 2)  PROTIME-INR  LACTIC ACID, PLASMA  URINALYSIS, ROUTINE W REFLEX MICROSCOPIC   Radiology CT ABDOMEN PELVIS W CONTRAST  Result Date: 02/15/2022 CLINICAL DATA:  Abdominal pain since Wednesday. History of Crohn disease. Prior abdominal perineal resection for rectal cancer. Suspicion of bowel obstruction. * Tracking Code: BO * EXAM: CT ABDOMEN AND PELVIS WITH CONTRAST TECHNIQUE: Multidetector CT imaging of the abdomen and pelvis was performed using the standard protocol following bolus administration of intravenous contrast. RADIATION DOSE REDUCTION: This exam was performed according to the departmental dose-optimization program which includes automated exposure control, adjustment of the mA and/or kV according to patient size and/or use of iterative reconstruction technique. CONTRAST:  170m OMNIPAQUE IOHEXOL 300 MG/ML  SOLN COMPARISON:  10/12/2020 FINDINGS: Lower chest: Clear lung bases. Normal heart size without pericardial or pleural effusion. Hepatobiliary: Focal steatosis adjacent the falciform ligament. Normal gallbladder, without biliary ductal dilatation. Pancreas: Normal, without mass or ductal dilatation.  Spleen: Normal in size, without focal abnormality. Adrenals/Urinary Tract: Normal adrenal glands. Development of moderate bilateral hydroureteronephrosis. Followed to the level of the mid pelvis. The bladder is mildly thick walled. Stomach/Bowel: Normal stomach, without wall thickening. Descending colostomy consistent with prior abdominal perineal resection. Small bowel loops are dilated and fluid-filled including up to 4.0 cm on 55/2. Suspect a pelvic transition including on 69 through 61 of series 2. No obstructive mass identified. No complicating ischemia. Vascular/Lymphatic: Aortic atherosclerosis. Right common iliac node measures 9 mm on 67/2 and is new. Left common iliac node measures 9 mm on 62/2 and is significantly enlarged since the prior. Reproductive: Prior hysterectomy per report. Left adnexal cystic lesions of up to 2.5 cm including on 79/2, slightly increased compared to 10/12/2020. Other: Presacral soft tissue and fluid density including on 83/2. trace gas within on 85/2. Especially when comparing sagittal image 80 to the prior exam, suspect increased caudal extension. Foci of gas also identified within more cephalad extension of presacral soft tissue thickening, including on 76/2, new. No free intraperitoneal air. No evidence of  omental or peritoneal disease. Musculoskeletal: L4-5 trans pedicle screw fixation. Advanced degenerate disc disease at this level as well as L3-4. IMPRESSION: 1. Partial small bowel obstruction with transition point in the pelvis, most likely due to adhesions. 2. Development of bilateral hydroureteronephrosis, followed to the level of the pelvis. Possibly related to scarring in this patient who is status post abdominal perineal resection for presumed rectal carcinoma. However, given below findings, residual or recurrent disease is possible. 3. Subtly progressive presacral soft tissue thickening with areas of small volume gas within. Although findings could represent  fistulous communication to bowel and resultant inflammation, local recurrence cannot be excluded. Consider further evaluation with PET. 4. Enlargement of small pelvic nodes, reactive or indicative of early metastasis. 5. Enlargement of left pelvic/adnexal cystic lesions, possibly residual ovarian follicles or cysts versus postoperative entities such as peritoneal inclusion cysts. 6. Mildly thick walled bladder, possibly representing cystitis. 7.  Aortic Atherosclerosis (ICD10-I70.0). Electronically Signed   By: Abigail Miyamoto M.D.   On: 02/15/2022 05:26   DG Chest Port 1 View  Result Date: 02/15/2022 CLINICAL DATA:  Abdominal pain. EXAM: PORTABLE CHEST 1 VIEW COMPARISON:  02/03/2018. FINDINGS: The heart size and mediastinal contours are within normal limits. There is atherosclerotic calcification of the aorta. No consolidation, effusion, or pneumothorax. No acute osseous abnormality. IMPRESSION: No active disease. Electronically Signed   By: Brett Fairy M.D.   On: 02/15/2022 03:49    Procedures Procedures  Cardiac monitor shows sinus tachycardia, per my interpretation.  Medications Ordered in ED Medications  promethazine (PHENERGAN) 25 MG/ML injection (has no administration in time range)  lactated ringers bolus 1,000 mL (0 mLs Intravenous Stopped 02/15/22 0439)  promethazine (PHENERGAN) 25 mg in sodium chloride 0.9 % 50 mL IVPB (0 mg Intravenous Stopped 02/15/22 0419)  morphine (PF) 4 MG/ML injection 4 mg (4 mg Intravenous Given 02/15/22 0328)  iohexol (OMNIPAQUE) 300 MG/ML solution 100 mL (100 mLs Intravenous Contrast Given 02/15/22 0450)  lactated ringers bolus 1,000 mL (0 mLs Intravenous Stopped 02/15/22 7628)    ED Course/ Medical Decision Making/ A&P                           Medical Decision Making Amount and/or Complexity of Data Reviewed Labs: ordered. Radiology: ordered.  Risk Prescription drug management. Decision regarding hospitalization.   Abdominal pain and vomiting in patient  with history of multiple abdominal surgeries concerning for bowel obstruction.  Differential diagnosis includes diverticulitis, peptic ulcer disease, cholecystitis, pancreatitis, gastroenteritis.  I have ordered IV fluids, morphine, promethazine and I have ordered laboratory work-up of CBC, comprehensive metabolic panel, lipase, lactic acid level.  I have ordered CT of abdomen and pelvis to look for evidence of bowel obstruction.  I have reviewed and interpreted her laboratory test, and my interpretation is mild hypokalemia likely secondary to GI losses from vomiting, mild thrombocytosis which is likely reactive, mildly elevated glucose.  Lactic acid is mildly elevated and this is felt to be secondary to dehydration and not sepsis.  I have ordered additional IV fluids.  CT scan shows partial small bowel obstruction with transition point in the pelvis likely from adhesions.  Also, bilateral hydronephrosis.  Progressive presacral soft tissue thickening with areas of gas consistent with fistula (patient has known fistulas) but concern for possible local recurrence with recommendation to get PET scan.  Case is discussed with Dr. Alcario Drought of [facility], who agrees to admit the patient.  Final Clinical Impression(s) / ED  Diagnoses Final diagnoses:  Partial small bowel obstruction ([place])  Hypokalemia  Thrombocytosis    Rx / DC Orders ED Discharge Orders     None         Delora Fuel, MD 44/51/46 [phone]

## 2022-02-15 NOTE — H&P (Signed)
History and Physical    Patient: Lindsay Little OEV:[id number] DOB: [birth year] DOA: 02/15/2022 DOS: the patient was seen and examined on 02/15/2022 PCP: [name], MD  Patient coming from: Home  Chief Complaint: N/V  HPI: Lindsay Little is a 56 y.o. female with medical history significant of colorectal cancer s/p resection, rectovaginal fistula, mixed urinary incontinence, HTN, hypothyroidism. Presenting with N/V. Her symptoms started 3 days ago. She had some mild abdominal pain to go along with her nausea. She tried some anti-emetics at home, but has been unable to keep anything down. She has not had a change in her colostomy output. She denies fevers or sick contacts. During this team she has felt more fatigue. When her symptoms did not improve, she decided to go to the ED for assistance.     Review of Systems: As mentioned in the history of present illness. All other systems reviewed and are negative. Past Medical History:  Diagnosis Date   Anxiety    Chemotherapy-induced neuropathy (HCC)    toes and fingers numbness and tingling   Colorectal delayed anastomotic leak s/p resection & colostomy 07/12/2016 11/17/2015   Colostomy in place [facility]) 07/12/2016   due to anastomosis breakdown w/ colovaginal fistula   Colovaginal fistula s/p omentopexy repair 07/12/2016 82/99/3716   Complication of anesthesia    Depression    Family history of adverse reaction to anesthesia    mother-- ponv   Family history of breast cancer    Family history of pancreatic cancer    Family history of skin cancer    Gastroenteritis 12/20/2015   GERD (gastroesophageal reflux disease)    Hardware complicating wound infection ([place]) 08/03/2019   Hiatal hernia    History of cancer chemotherapy 02-20-2015 to 03-30-2015   History of cardiac murmur as a child    History of chemotherapy    History of chronic gastritis    History of hypertension    no issues since multiple abdminal sx's and chemo--- no medication  since 12/ 2016   History of TMJ disorder    Hypercholesteremia    Hypertension    IBS (irritable bowel syndrome)    dx age 36   Intermittent abdominal pain    post-op multiple abdominal sx's    Microcytic anemia    Mild sleep apnea    per pt study 2014  very mild osa , no cpap recommended, recommeded wt loss and sleep routine   OA (osteoarthritis)    left knee /  left shoulder   Obesity    PONV (postoperative nausea and vomiting)    severe" needs Scopolamine PATCH"    Portacath in place    right chest   Rectal adenocarcinoma ([place]) oncologis-  dr Burr Medico-- after radiation/ chemo (ypT1, N0) --  no recurrence per last note 03/ 2018   dx 01-13-2015-- Stage IIIC (T3, N2, M0) post concurrent radiation and chemotherapy 02-20-2015 to 03-30-2015 /  05-25-2015 s/p  LAR w/ RSO (post-op complicated by late abscess and contained anatomotic leak w/ help percutaneous drainage and antibiotics)    Rotator cuff tear, left    S/P radiation therapy 02/20/15-03/30/15   colon/rectal   Vitamin D deficiency    Wears glasses    Wears glasses    Past Surgical History:  Procedure Laterality Date   ABDOMINAL HYSTERECTOMY  1996   uterus and cervix   BACK SURGERY  02/12/2018   lumbar surgery   COLON RESECTION N/A 07/12/2016   Procedure: LAPAROSCOPIC LYSIS OF ADHESIONS, OMENTOPEXY, HAND  ASSISTED RESECTION OF  COLON, END TO END ANASTOMOSIS, COLOSTOMY;  Surgeon: Michael Boston, MD;  Location: [facility];  Service: General;  Laterality: N/A;   COMBINED HYSTEROSCOPY DIAGNOSTIC / D&C  x2 1990's   DIAGNOSTIC LAPAROSCOPY  age 71 and age 41   EUS N/A 01/18/2015   Procedure: LOWER ENDOSCOPIC ULTRASOUND (EUS);  Surgeon: Arta Silence, MD;  Location: [facility];  Service: Endoscopy;  Laterality: N/A;   EXCISION OF SKIN TAG  11/17/2015   Procedure: EXCISION OF SKIN TAG;  Surgeon: Michael Boston, MD;  Location: [facility];  Service: General;;   ILEO LOOP COLOSTOMY CLOSURE N/A 11/17/2015   Procedure: LAPAROSCOPIC DIVERTING LOOP ILEOSTOMY   DRAINAGE OF PELVIC ABSCESS;  Surgeon: Michael Boston, MD;  Location: [facility];  Service: General;  Laterality: N/A;   ILEOSTOMY CLOSURE N/A 05/31/2016   Procedure: TAKEDOWN LOOP ILEOSTOMY;  Surgeon: Michael Boston, MD;  Location: [facility];  Service: General;  Laterality: N/A;   IMPACTION REMOVAL  11/17/2015   Procedure: DISIMPACTION REMOVAL;  Surgeon: Michael Boston, MD;  [facility]: [facility];  Service: General;;   INSERTION OF MESH N/A 07/23/2018   Procedure: INSERTION OF MESH X2;  Surgeon: Michael Boston, MD;  Location: [facility];  Service: General;  Laterality: N/A;   IR LUMBAR [place] W/IMG GUIDE  07/01/2019   KNEE ARTHROSCOPY Left 1990's   KNEE ARTHROSCOPY W/ MENISCECTOMY Left 09/14/2009   and chondroplasty debridement   LAPAROSCOPIC LYSIS OF ADHESIONS  11/17/2015   Procedure: LAPAROSCOPIC LYSIS OF ADHESIONS;  Surgeon: Michael Boston, MD;  Location: [facility];  Service: General;;   LUMBAR WOUND DEBRIDEMENT N/A 04/24/2018   Procedure: wound exploration, irrigation and debridement;  Surgeon: Eustace Moore, MD;  Location: [place];  Service: Neurosurgery;  Laterality: N/A;  wound exploration, irrigation and debridement   LYSIS OF ADHESION N/A 07/23/2018   Procedure: LYSIS OF ADHESIONS;  Surgeon: Michael Boston, MD;  [facility];  Service: General;  Laterality: N/A;   PORT-A-CATH REMOVAL N/A 11/21/2016   Procedure: REMOVAL PORT-A-CATH;  Surgeon: Michael Boston, MD;  Location: [place];  Service: General;  Laterality: N/A;   PORTACATH PLACEMENT N/A 05/25/2015   Procedure: INSERTION PORT-A-CATH;  Surgeon: Michael Boston, MD;  Location: [facility];  Service: General;  Laterality: N/A;-remains inplace Right chest.   ROTATOR CUFF REPAIR Right 2006   TONSILLECTOMY  age 66   [place] N/A 07/23/2018   Procedure: [place];  Surgeon: Michael Boston, MD;  Location[facility];  Service: General;  Laterality: N/A;   XI ROBOTIC ASSISTED LOWER ANTERIOR RESECTION N/A 05/25/2015    Procedure: XI ROBOTIC ASSISTED LOWER ANTERIOR RESECTION, , RIGID PROCTOSCOPY, RIGHT OOPHORECTOMY;  Surgeon: Michael Boston, MD;  Location: [facility];  Service: General;  Laterality: N/A;   Social History:  reports that she quit smoking about 30 years ago. Her smoking use included cigarettes. She has a 1.75 pack-year smoking history. She has never used smokeless tobacco. She reports that she does not drink alcohol and does not use drugs.  Allergies  Allergen Reactions   Caine-1 [Lidocaine] Swelling and Rash    Eyes swell shut; includes all caine drugs except marcaine. EMLA cream OK though (?!)   Bupropion Other (See Comments)   Sulfa Antibiotics Nausea And Vomiting and Rash   Pantoprazole Other (See Comments)   Adhesive [Tape] Rash and Other (See Comments)    Blisters - can use paper tape   Doxycycline Nausea Only   Flagyl [Metronidazole] Nausea Only  Iron Nausea Only   Metoprolol Nausea Only and Palpitations   Oxycodone Other (See Comments)    NIGHTMARES. (tolerates hydrocodone or tramadol better)   Penicillins Nausea Only and Rash    Has patient had a PCN reaction causing immediate rash, facial/tongue/throat swelling, SOB or lightheadedness with hypotension: no Has patient had a PCN reaction causing severe rash involving mucus membranes or skin necrosis: no Has patient had a PCN reaction that required hospitalization no Has patient had a PCN reaction occurring within the last 10 years: no If all of the above answers are "NO", then may proceed with Cephalosporin use.     Family History  Problem Relation Age of Onset   Coronary artery disease Mother 20   Hypertension Mother    Hyperlipidemia Mother    Diabetes Mellitus I Mother    Coronary artery disease Father    Hyperlipidemia Father    Hypertension Father    Cancer Sister        skin - non melanoma   Hyperlipidemia Brother    Cancer Maternal Uncle 53       pancreatic with mets to colon and prostate   Cirrhosis Maternal Uncle     Hypertension Maternal Grandmother    Diabetes Mellitus I Maternal Grandmother    Hyperlipidemia Maternal Grandmother    CVA Maternal Grandmother    Hypertension Maternal Grandfather    Coronary artery disease Maternal Grandfather 43   Hyperlipidemia Maternal Grandfather    Coronary artery disease Paternal Grandmother    Hypertension Paternal Grandmother    Hyperlipidemia Paternal Grandmother    Diabetes Mellitus I Paternal Grandmother    Hypertension Paternal Grandfather    Hyperlipidemia Paternal Grandfather    Coronary artery disease Paternal Grandfather     Prior to Admission medications   Medication Sig Start Date End Date Taking? Authorizing Provider  bisoprolol (ZEBETA) 10 MG tablet Take 10 mg by mouth daily. 11/30/19   [provider]  celecoxib (CELEBREX) 100 MG capsule Take 100 mg by mouth 2 (two) times daily. 12/12/20   [provider]  Cholecalciferol (VITAMIN D3) 5000 units CAPS Take 5,000 Units by mouth at bedtime.     [provider]  Coenzyme Q10 (COQ10) 200 MG CAPS Take 200 mg by mouth at bedtime.    [provider]  DULoxetine (CYMBALTA) 60 MG capsule Take 120 mg by mouth at bedtime.     [provider]  esomeprazole (NEXIUM) 40 MG capsule Take 1 capsule (40 mg total) by mouth 2 (two) times daily before a meal. 12/22/15   Michael Boston, MD  furosemide (LASIX) 20 MG tablet Take 20 mg by mouth daily as needed for edema.     [provider]  gabapentin (NEURONTIN) 800 MG tablet Take 800 mg by mouth 3 (three) times daily. 12/23/19   [provider]  HYDROcodone-acetaminophen (NORCO) 10-325 MG tablet Take 1 tablet by mouth every 8 (eight) hours as needed for severe pain. 08/25/19   Truitt Merle, MD  HYDROmorphone (DILAUDID) 4 MG tablet Take 4 mg by mouth every 6 (six) hours as needed. 02/08/22   [provider]  levETIRAcetam (KEPPRA) 250 MG tablet Take 250 mg by mouth 2 (two) times daily.    [provider]  levothyroxine (SYNTHROID) 50 MCG tablet Take 50 mcg by mouth every morning. 02/07/22   [provider]  Melatonin 3 MG TABS Take 3-6 mg by mouth at bedtime.     [provider]  morphine (MS CONTIN)  15 MG 12 hr tablet Take 15 mg by mouth 2 (two) times daily. 01/29/22   [provider]  Multiple Vitamin (MULTIVITAMIN WITH MINERALS) TABS tablet Take 2 tablets by mouth at bedtime.     [provider]  oxybutynin (DITROPAN-XL) 10 MG 24 hr tablet Take 10 mg by mouth daily. 02/12/22   [provider]  polyethylene glycol (MIRALAX / GLYCOLAX) packet Take 17 g by mouth daily as needed for moderate constipation.    [provider]  pregabalin (LYRICA) 225 MG capsule Take 225 mg by mouth 2 (two) times daily. 02/07/22   [provider]  promethazine (PHENERGAN) 25 MG tablet TAKE ONE TABLET BY MOUTH EVERY 6 HOURS AS NEEDED FOR NAUSEA OR VOMITING 07/04/20   Tommy Medal, Lavell Islam, MD  rosuvastatin (CRESTOR) 5 MG tablet Take 5 mg by mouth at bedtime.    [provider]  triamcinolone lotion (KENALOG) 0.1 % Apply 1 application topically as needed (dry skin).    [provider]    Physical Exam: Vitals:   02/15/22 0900 02/15/22 0907 02/15/22 1130 02/15/22 1250  BP: (!) 147/101  (!) 162/95 (!) 155/91  Pulse: (!) 123  (!) 110 86  Resp: 19  16 16   Temp:  99 F (37.2 C)  99.2 F (37.3 C)  TempSrc:  Oral  Oral  SpO2: 92%  93% 94%   General: 56 y.o. female resting in bed in NAD Eyes: PERRL, normal sclera ENMT: Nares patent w/o discharge, orophaynx clear, dentition normal, ears w/o discharge/lesions/ulcers Neck: Supple, trachea midline Cardiovascular: RRR, +S1, S2, no m/g/r, equal pulses throughout Respiratory: CTABL, no w/r/r, normal WOB GI: BS+, obese, NT, no masses noted, no organomegaly noted, colostomy noted w/ good output MSK: No e/c/c Neuro: A&O x 3, no focal deficits Psyc: Appropriate interaction and affect,  calm/cooperative  Data Reviewed:  Lab Results  Component Value Date   NA 141 02/15/2022   K 3.3 (L) 02/15/2022   CO2 26 02/15/2022   GLUCOSE 164 (H) 02/15/2022   BUN 18 02/15/2022   CREATININE 0.94 02/15/2022   CALCIUM 10.1 02/15/2022   EGFR >60 05/30/2017   GFRNONAA >60 02/15/2022   Lab Results  Component Value Date   WBC 8.8 02/15/2022   HGB 13.3 02/15/2022   HCT 42.3 02/15/2022   MCV 80.9 02/15/2022   PLT 455 (H) 02/15/2022   CT ab/pelvis w/ contrast 1. Partial small bowel obstruction with transition point in the pelvis, most likely due to adhesions. 2. Development of bilateral hydroureteronephrosis, followed to the level of the pelvis. Possibly related to scarring in this patient who is status post abdominal perineal resection for presumed rectal carcinoma. However, given below findings, residual or recurrent disease is possible. 3. Subtly progressive presacral soft tissue thickening with areas of small volume gas within. Although findings could represent fistulous communication to bowel and resultant inflammation, local recurrence cannot be excluded. Consider further evaluation with PET. 4. Enlargement of small pelvic nodes, reactive or indicative of early metastasis. 5. Enlargement of left pelvic/adnexal cystic lesions, possibly residual ovarian follicles or cysts versus postoperative entities such as peritoneal inclusion cysts. 6. Mildly thick walled bladder, possibly representing cystitis. 7.  Aortic Atherosclerosis (ICD10-I70.0).  CXR: No active disease.  Assessment and Plan: pSBO     - placed in obs, med-surg     - imaging as above     - CCS consulted, appreciate assistance     - right now, keep NPO     - she  hasn't vomited since last night, will hold on NGT until surgery evaluates     - anti-emetics, fluids, pain control  B/l hydronephrosis Hx of mixed urinary incontinence Hx of rectovaginal fistula     - no recent imaging to compare  hydronephrosis     - recent exam under anesthesia with [facility] to exam cause of urinary incontinence; r/o'd vesicovaginal fistula; showed severely fibrotic appearing bladder w/ decreased capacity (~100cc) with a widely open bladder neck and no obvious fistulas; vaginal vault was shortened w/ scarring and showed potential areas of her previously know rectovaginal fistula     - spoke with [facility] (Dr. Jeffie Pollock); rec'd just monitoring for now as her renal function is normal and she still has urinary outpt; rec'd follow up US in a couple of day    Hx of colorectal CA s/p resection; colostomy in place     - colostomy functional and has good outpt  Morbid obesity     - counsel on diet, lifestyle changes     - follow up outpt  HTN     - resume home regimen when she is cleared for PO     - will have PRNs available  Lactic acidosis     - secondary to dehydration?     - no leukocytosis, fever     - check UA, procal     - right now, will follow with fluids   Hypokalemia     - mild, replace K+, check Mg2+  HLD     - resume home regimen when she is cleared for PO  Hypothyroidism     - resume home regimen when she is cleared for PO  GERD     - pepcid  Hyperglycemia     - check A1c  Advance Care Planning:   Code Status: FULL  Consults: CCS  Family Communication: None at bedside  Severity of Illness: The appropriate patient status for this patient is INPATIENT. Inpatient status is judged to be reasonable and necessary in order to provide the required intensity of service to ensure the patient's safety. The patient's presenting symptoms, physical exam findings, and initial radiographic and laboratory data in the context of their chronic comorbidities is felt to place them at high risk for further clinical deterioration. Furthermore, it is not anticipated that the patient will be medically stable for discharge from the hospital within 2 midnights of admission.   * I  certify that at the point of admission it is my clinical judgment that the patient will require inpatient hospital care spanning beyond 2 midnights from the point of admission due to high intensity of service, high risk for further deterioration and high frequency of surveillance required.*  Author: Jonnie Finner, DO 02/15/2022 1:06 PM  For on call review [email or web address].

## 2022-02-16 DIAGNOSIS — N3946 Mixed incontinence: Secondary | ICD-10-CM | POA: Diagnosis present

## 2022-02-16 DIAGNOSIS — I10 Essential (primary) hypertension: Secondary | ICD-10-CM | POA: Diagnosis present

## 2022-02-16 DIAGNOSIS — Z8249 Family history of ischemic heart disease and other diseases of the circulatory system: Secondary | ICD-10-CM | POA: Diagnosis not present

## 2022-02-16 DIAGNOSIS — N1330 Unspecified hydronephrosis: Secondary | ICD-10-CM | POA: Diagnosis present

## 2022-02-16 DIAGNOSIS — G2581 Restless legs syndrome: Secondary | ICD-10-CM | POA: Diagnosis present

## 2022-02-16 DIAGNOSIS — Z933 Colostomy status: Secondary | ICD-10-CM | POA: Diagnosis not present

## 2022-02-16 DIAGNOSIS — F32A Depression, unspecified: Secondary | ICD-10-CM | POA: Diagnosis present

## 2022-02-16 DIAGNOSIS — Z85048 Personal history of other malignant neoplasm of rectum, rectosigmoid junction, and anus: Secondary | ICD-10-CM | POA: Diagnosis not present

## 2022-02-16 DIAGNOSIS — E039 Hypothyroidism, unspecified: Secondary | ICD-10-CM | POA: Diagnosis present

## 2022-02-16 DIAGNOSIS — N736 Female pelvic peritoneal adhesions (postinfective): Secondary | ICD-10-CM | POA: Diagnosis present

## 2022-02-16 DIAGNOSIS — E7800 Pure hypercholesterolemia, unspecified: Secondary | ICD-10-CM | POA: Diagnosis present

## 2022-02-16 DIAGNOSIS — Z6841 Body Mass Index (BMI) 40.0 and over, adult: Secondary | ICD-10-CM | POA: Diagnosis not present

## 2022-02-16 DIAGNOSIS — D75839 Thrombocytosis, unspecified: Secondary | ICD-10-CM | POA: Diagnosis present

## 2022-02-16 DIAGNOSIS — R9389 Abnormal findings on diagnostic imaging of other specified body structures: Secondary | ICD-10-CM | POA: Diagnosis not present

## 2022-02-16 DIAGNOSIS — E876 Hypokalemia: Secondary | ICD-10-CM | POA: Diagnosis present

## 2022-02-16 DIAGNOSIS — G894 Chronic pain syndrome: Secondary | ICD-10-CM | POA: Diagnosis present

## 2022-02-16 DIAGNOSIS — K56600 Partial intestinal obstruction, unspecified as to cause: Secondary | ICD-10-CM | POA: Diagnosis present

## 2022-02-16 DIAGNOSIS — N823 Fistula of vagina to large intestine: Secondary | ICD-10-CM | POA: Diagnosis present

## 2022-02-16 DIAGNOSIS — Z823 Family history of stroke: Secondary | ICD-10-CM | POA: Diagnosis not present

## 2022-02-16 DIAGNOSIS — K5651 Intestinal adhesions [bands], with partial obstruction: Secondary | ICD-10-CM | POA: Diagnosis present

## 2022-02-16 DIAGNOSIS — Z803 Family history of malignant neoplasm of breast: Secondary | ICD-10-CM | POA: Diagnosis not present

## 2022-02-16 DIAGNOSIS — E872 Acidosis, unspecified: Secondary | ICD-10-CM | POA: Diagnosis present

## 2022-02-16 DIAGNOSIS — Z808 Family history of malignant neoplasm of other organs or systems: Secondary | ICD-10-CM | POA: Diagnosis not present

## 2022-02-16 DIAGNOSIS — Z800 Family history of malignant neoplasm of digestive organs: Secondary | ICD-10-CM | POA: Diagnosis not present

## 2022-02-16 DIAGNOSIS — K219 Gastro-esophageal reflux disease without esophagitis: Secondary | ICD-10-CM | POA: Diagnosis present

## 2022-02-16 LAB — CBC
HCT: 35.2 % — ABNORMAL LOW (ref 36.0–46.0)
Hemoglobin: 10.6 g/dL — ABNORMAL LOW (ref 12.0–15.0)
MCH: 25.9 pg — ABNORMAL LOW (ref 26.0–34.0)
MCHC: 30.1 g/dL (ref 30.0–36.0)
MCV: 85.9 fL (ref 80.0–100.0)
Platelets: 286 10*3/uL (ref 150–400)
RBC: 4.1 MIL/uL (ref 3.87–5.11)
RDW: 15.8 % — ABNORMAL HIGH (ref 11.5–15.5)
WBC: 6.7 10*3/uL (ref 4.0–10.5)
nRBC: 0 % (ref 0.0–0.2)

## 2022-02-16 LAB — COMPREHENSIVE METABOLIC PANEL
ALT: 13 U/L (ref 0–44)
AST: 14 U/L — ABNORMAL LOW (ref 15–41)
Albumin: 3.4 g/dL — ABNORMAL LOW (ref 3.5–5.0)
Alkaline Phosphatase: 57 U/L (ref 38–126)
Anion gap: 9 (ref 5–15)
BUN: 14 mg/dL (ref 6–20)
CO2: 28 mmol/L (ref 22–32)
Calcium: 9.3 mg/dL (ref 8.9–10.3)
Chloride: 105 mmol/L (ref 98–111)
Creatinine, Ser: 0.89 mg/dL (ref 0.44–1.00)
GFR, Estimated: >60 mL/min (ref >60)
Glucose, Bld: 119 mg/dL — ABNORMAL HIGH (ref 70–99)
Potassium: 3.9 mmol/L (ref 3.5–5.1)
Sodium: 142 mmol/L (ref 135–145)
Total Bilirubin: 0.4 mg/dL (ref 0.3–1.2)
Total Protein: 7.7 g/dL (ref 6.5–8.1)

## 2022-02-16 LAB — HIV ANTIBODY (ROUTINE TESTING W REFLEX): HIV Screen 4th Generation wRfx: NONREACTIVE

## 2022-02-16 MED ORDER — SODIUM CHLORIDE 0.9 % IV SOLN
250.0000 mg | Freq: Two times a day (BID) | INTRAVENOUS | Status: DC
  Administered 2022-02-16 – 2022-02-17 (×3): 250 mg via INTRAVENOUS
  Filled 2022-02-16 (×4): qty 2.5

## 2022-02-16 MED ORDER — SODIUM CHLORIDE 0.9% FLUSH
3.0000 mL | Freq: Two times a day (BID) | INTRAVENOUS | Status: DC
  Administered 2022-02-16 – 2022-02-18 (×4): 3 mL via INTRAVENOUS

## 2022-02-16 MED ORDER — LIP MEDEX EX OINT
TOPICAL_OINTMENT | Freq: Two times a day (BID) | CUTANEOUS | Status: DC
  Administered 2022-02-16 – 2022-02-17 (×2): 75 via TOPICAL
  Filled 2022-02-16 (×2): qty 7

## 2022-02-16 MED ORDER — LACTATED RINGERS IV BOLUS: 1000.0000 mL | Freq: Three times a day (TID) | INTRAVENOUS | Status: AC | PRN

## 2022-02-16 MED ORDER — SODIUM CHLORIDE 0.9% FLUSH: 3.0000 mL | INTRAVENOUS | Status: DC | PRN

## 2022-02-16 MED ORDER — ALUM & MAG HYDROXIDE-SIMETH 200-200-20 MG/5ML PO SUSP: 30.0000 mL | Freq: Four times a day (QID) | ORAL | Status: DC | PRN

## 2022-02-16 MED ORDER — PROCHLORPERAZINE EDISYLATE 10 MG/2ML IJ SOLN
5.0000 mg | INTRAMUSCULAR | Status: DC | PRN
  Administered 2022-02-16: 5 mg via INTRAVENOUS
  Filled 2022-02-16: qty 2

## 2022-02-16 MED ORDER — METHOCARBAMOL 500 MG PO TABS: 1000.0000 mg | ORAL_TABLET | Freq: Four times a day (QID) | ORAL | Status: DC | PRN

## 2022-02-16 MED ORDER — PHENOL 1.4 % MT LIQD: 2.0000 | OROMUCOSAL | Status: DC | PRN

## 2022-02-16 MED ORDER — MAGNESIUM SULFATE IN D5W 1-5 GM/100ML-% IV SOLN
1.0000 g | Freq: Once | INTRAVENOUS | Status: AC
  Administered 2022-02-16: 1 g via INTRAVENOUS
  Filled 2022-02-16: qty 100

## 2022-02-16 MED ORDER — ONDANSETRON HCL 4 MG PO TABS
4.0000 mg | ORAL_TABLET | Freq: Three times a day (TID) | ORAL | Status: DC
  Administered 2022-02-16 – 2022-02-18 (×9): 4 mg via ORAL
  Filled 2022-02-16 (×9): qty 1

## 2022-02-16 MED ORDER — MENTHOL 3 MG MT LOZG: 1.0000 | LOZENGE | OROMUCOSAL | Status: DC | PRN

## 2022-02-16 MED ORDER — BISOPROLOL FUMARATE 5 MG PO TABS
10.0000 mg | ORAL_TABLET | Freq: Every day | ORAL | Status: DC
  Administered 2022-02-16 – 2022-02-18 (×3): 10 mg via ORAL
  Filled 2022-02-16 (×3): qty 2

## 2022-02-16 MED ORDER — SODIUM CHLORIDE 0.9 % IV SOLN: 250.0000 mL | INTRAVENOUS | Status: DC | PRN

## 2022-02-16 MED ORDER — HYDROMORPHONE HCL 1 MG/ML IJ SOLN
0.5000 mg | INTRAMUSCULAR | Status: DC | PRN
  Administered 2022-02-16 (×2): 0.5 mg via INTRAVENOUS
  Filled 2022-02-16 (×2): qty 0.5

## 2022-02-16 MED ORDER — LEVETIRACETAM 250 MG PO TABS
250.0000 mg | ORAL_TABLET | Freq: Two times a day (BID) | ORAL | Status: DC
  Filled 2022-02-16: qty 1

## 2022-02-16 MED ORDER — MAGIC MOUTHWASH: 15.0000 mL | Freq: Four times a day (QID) | ORAL | Status: DC | PRN

## 2022-02-16 MED ORDER — DIPHENHYDRAMINE HCL 50 MG/ML IJ SOLN: 12.5000 mg | Freq: Four times a day (QID) | INTRAMUSCULAR | Status: DC | PRN

## 2022-02-16 MED ORDER — ENOXAPARIN SODIUM 80 MG/0.8ML IJ SOSY
70.0000 mg | PREFILLED_SYRINGE | INTRAMUSCULAR | Status: DC
  Administered 2022-02-16 – 2022-02-17 (×2): 70 mg via SUBCUTANEOUS
  Filled 2022-02-16 (×2): qty 0.8

## 2022-02-16 MED ORDER — MELATONIN 3 MG PO TABS: 3.0000 mg | ORAL_TABLET | Freq: Every evening | ORAL | Status: DC | PRN

## 2022-02-16 MED ORDER — METHOCARBAMOL 1000 MG/10ML IJ SOLN: 1000.0000 mg | Freq: Four times a day (QID) | INTRAVENOUS | Status: DC | PRN

## 2022-02-16 MED ORDER — POLYETHYLENE GLYCOL 3350 17 G PO PACK
17.0000 g | PACK | Freq: Two times a day (BID) | ORAL | Status: DC
  Administered 2022-02-16 – 2022-02-18 (×4): 17 g via ORAL
  Filled 2022-02-16 (×5): qty 1

## 2022-02-16 NOTE — Progress Notes (Signed)
Lindsay Little [id number] 06/03/1966  CARE TEAM:  PCP: [name], MD  Outpatient Care Team: Patient Care Team: [name], MD as PCP - General (Family Medicine) Jerline Pain, MD as PCP - Cardiology (Cardiology) Michael Boston, MD as Consulting Physician (General Surgery) Truitt Merle, MD as Consulting Physician (Medical Oncology) Kyung Rudd, MD as Consulting Physician (Radiation Oncology) Teena Irani, MD (Inactive) as Consulting Physician (Gastroenterology) Raynelle Bring, MD as Consulting Physician (Urology) Eustace Moore, MD as Consulting Physician (Neurosurgery)  Inpatient Treatment Team: Treatment Team: Attending Provider: Florencia Reasons, MD; Consulting Physician: Edison Pace, Md, MD; Rounding Team: Fatima Blank, MD; Utilization Review: Claudie Leach, RN; Registered Nurse: Marlis Edelson, RN; Social Worker: Nada Libman; Pharmacist: Emiliano Dyer, [facility]   Problem List:   Principal Problem:   Partial small bowel obstruction [facility]) Active Problems:   Rectal adenocarcinoma s/p LAR resection 05/25/2015   Obesity, Class III, BMI 40-49.9 (morbid obesity) ([place])   Pure hypercholesterolemia   Hypertension   Hypokalemia   Colostomy in place [facility])   Hypothyroidism   GERD (gastroesophageal reflux disease)   Hyperglycemia   Lactic acidosis   Urinary incontinence, mixed   Bilateral hydronephrosis      * No surgery found *      Assessment  Chronic PSBO slowly resolving  [facility] Stay = 0 days)  Plan:  Patient quite familiar to me since I have involved in care with her for many years.  -Unfortunately she has quite a hostile abdomen given her prior radiation and surgery for cancer complicated by delayed leak and fistula formation requiring omental plan and ultimately fecal diversion with permanent end colostomy, then parastomal hernia requiring mesh repair.  To my view, her chronic pelvic thickening seems stable.  It is 6 and half years since  resection of her rectal cancer so I am hopeful she does not have local recurrence but certainly concern.  I think she has chronic inflammation down her pelvis I do not know how accurate a PET scan would be but could defer to Dr. Burr Medico and see what she feels.  Transition point seems to be in the suprapubic retroperitoneum.  Not completely obstructing.  She is having some flatus and crampiness is gone down although she still some nausea.  We will keep her on clears and advance tube full/.  Diet as tolerated.  I wonder if she needs to stay on that for the next week.  We will see.  Chronic nausea control.  Management of her other his numerous medical problems per primary service. -VTE prophylaxis- SCDs, etc -mobilize as tolerated to help recovery  Disposition:  Disposition:  The patient is from: Home  Anticipate discharge to:  Home  Anticipated Date of Discharge is:  August 7,2023    Barriers to discharge:  Pending Clinical improvement (more likely than not)  Patient currently is NOT MEDICALLY STABLE for discharge from the hospital from a surgery standpoint.      I reviewed nursing notes, hospitalist notes, last 24 h vitals and pain scores, last 48 h intake and output, last 24 h labs and trends, and last 24 h imaging results. I have reviewed this patient's available data, including medical history, events of note, test results, etc as part of my evaluation.  A significant portion of that time was spent in counseling.  Care during the described time interval was provided by me.  This care required moderate level of medical decision making.  02/16/2022    Subjective: (Chief  complaint)  Patient with some nausea but improved.  No retching.  Starting to pass more into her colostomy bag.  Still with restless leg and abdominal cramping which is her baseline  Objective:  Vital signs:  Vitals:   02/15/22 1830 02/15/22 2124 02/16/22 0122 02/16/22 0544  BP:  124/76 138/73 (!) 152/72   Pulse:  73 68 77  Resp:  18 18 18   Temp:  98.7 F (37.1 C) 98.7 F (37.1 C) 98.6 F (37 C)  TempSrc:  Oral Oral Oral  SpO2:  97% 100% 100%  Weight: (!) 140 kg     Height: 5' 10.5" (1.791 m)       Last BM Date : 02/13/22  Intake/Output   Yesterday:  08/04 0701 - 08/05 0700 In: 2393.3 [I.V.:1000; IV Piggyback:493.3] Out: 700 [Urine:700] This shift:  No intake/output data recorded.  Bowel function:  Flatus: YES  BM:  YES  Drain: (No drain)   Physical Exam:  General: Pt awake/alert in no acute distress Eyes: PERRL, normal EOM.  Sclera clear.  No icterus Neuro: CN II-XII intact w/o focal sensory/motor deficits. Lymph: No head/neck/groin lymphadenopathy Psych:  No delerium/psychosis/paranoia.  Oriented x 4 HENT: Normocephalic, Mucus membranes moist.  No thrush Neck: Supple, No tracheal deviation.  No obvious thyromegaly Chest: No pain to chest wall compression.  Good respiratory excursion.  No audible wheezing CV:  Pulses intact.  Regular rhythm.  No major extremity edema MS: Normal AROM mjr joints.  No obvious deformity  Abdomen: Soft.  Nondistended.  Mildly tender at incisions only.  Colostomy on left side intact with no recurrent parastomal hernia.  Thick stool in bag.  Some flatus.  No evidence of peritonitis.  No incarcerated hernias.  Ext:   No deformity.  No mjr edema.  No cyanosis Skin: No petechiae / purpurea.  No major sores.  Warm and dry    Results:   Cultures: Recent Results (from the past 720 hour(s))  Culture, blood (Routine x 2)     Status: None (Preliminary result)   Collection Time: 02/15/22  3:50 AM   Specimen: BLOOD  Result Value Ref Range Status   Specimen Description   Final    BLOOD RIGHT ANTECUBITAL BOTTLES DRAWN AEROBIC AND ANAEROBIC Blood Culture adequate volume Performed at [facility], [address] [place], [place] [address]    Special Requests   Final    NONE Performed at [facility],  [address] [place], [place] [address]    Culture   Final    NO GROWTH < 24 HOURS Performed at [facility] [address], [place], [place] [address]    Report Status PENDING  Incomplete  Culture, blood (Routine x 2)     Status: None (Preliminary result)   Collection Time: 02/15/22  1:09 PM   Specimen: BLOOD  Result Value Ref Range Status   Specimen Description   Final    BLOOD BLOOD RIGHT HAND Performed at [facility] [address], [place], [place] [address]    Special Requests   Final    BOTTLES DRAWN AEROBIC ONLY Blood Culture adequate volume Performed at [facility] [address] [place], [place] [address]    Culture   Final    NO GROWTH < 24 HOURS Performed at [facility] [address], [place],  [address]    Report Status PENDING  Incomplete    Labs: Results for orders placed or performed during  the hospital encounter of 02/15/22 (from the past 48 hour(s))  Comprehensive metabolic panel     Status: Abnormal   Collection Time: 02/15/22  3:50 AM  Result Value Ref Range   Sodium 141 135 - 145 mmol/L   Potassium 3.3 (L) 3.5 - 5.1 mmol/L   Chloride 101 98 - 111 mmol/L   CO2 26 22 - 32 mmol/L   Glucose, Bld 164 (H) 70 - 99 mg/dL    Comment: Glucose reference range applies only to samples taken after fasting for at least 8 hours.   BUN 18 6 - 20 mg/dL   Creatinine, Ser 0.94 0.44 - 1.00 mg/dL   Calcium 10.1 8.9 - 10.3 mg/dL   Total Protein 8.5 (H) 6.5 - 8.1 g/dL   Albumin 4.1 3.5 - 5.0 g/dL   AST 9 (L) 15 - 41 U/L   ALT 10 0 - 44 U/L   Alkaline Phosphatase 67 38 - 126 U/L   Total Bilirubin 0.6 0.3 - 1.2 mg/dL   GFR, Estimated >60 >60 mL/min    Comment: (NOTE) Calculated using the CKD-EPI Creatinine Equation (2021)    Anion gap 14 5 - 15    Comment: Performed at [facility], [place], [place] [address]  Lactic acid, plasma     Status: Abnormal   Collection  Time: 02/15/22  3:50 AM  Result Value Ref Range   Lactic Acid, Venous 2.6 (HH) 0.5 - 1.9 mmol/L    Comment: CRITICAL RESULT CALLED TO, READ BACK BY AND VERIFIED WITH: KATIE WATLINGTON 02/15/22 AT 0434 HS Performed at [facility], [address] [place], [place] [address]   CBC with Differential     Status: Abnormal   Collection Time: 02/15/22  3:50 AM  Result Value Ref Range   WBC 8.8 4.0 - 10.5 K/uL   RBC 5.23 (H) 3.87 - 5.11 MIL/uL   Hemoglobin 13.3 12.0 - 15.0 g/dL   HCT 42.3 36.0 - 46.0 %   MCV 80.9 80.0 - 100.0 fL   MCH 25.4 (L) 26.0 - 34.0 pg   MCHC 31.4 30.0 - 36.0 g/dL   RDW 15.5 11.5 - 15.5 %   Platelets 455 (H) 150 - 400 K/uL   nRBC 0.0 0.0 - 0.2 %   Neutrophils Relative % 76 %   Neutro Abs 6.7 1.7 - 7.7 K/uL   Lymphocytes Relative 15 %   Lymphs Abs 1.3 0.7 - 4.0 K/uL   Monocytes Relative 8 %   Monocytes Absolute 0.7 0.1 - 1.0 K/uL   Eosinophils Relative 0 %   Eosinophils Absolute 0.0 0.0 - 0.5 K/uL   Basophils Relative 0 %   Basophils Absolute 0.0 0.0 - 0.1 K/uL   Immature Granulocytes 1 %   Abs Immature Granulocytes 0.04 0.00 - 0.07 K/uL    Comment: Performed at [facility], [address] [place], [place] [address]  Protime-INR     Status: None   Collection Time: 02/15/22  3:50 AM  Result Value Ref Range   Prothrombin Time 13.1 11.4 - 15.2 seconds   INR 1.0 0.8 - 1.2    Comment: (NOTE) INR goal varies based on device and disease states. Performed at [facility], [address] [place], [place] [address]   Culture, blood (Routine x 2)     Status: None (Preliminary result)   Collection Time: 02/15/22  3:50 AM   Specimen: BLOOD  Result Value Ref Range   Specimen Description  BLOOD RIGHT ANTECUBITAL BOTTLES DRAWN AEROBIC AND ANAEROBIC Blood Culture adequate volume Performed at [facility], [address] [place], [place] [address]    Special Requests       NONE Performed at [facility], [address], [place], [place] [address]    Culture      NO GROWTH < 24 HOURS Performed at [facility] [address] [place], [place] [address]    Report Status PENDING   Lactic acid, plasma     Status: None   Collection Time: 02/15/22  7:05 AM  Result Value Ref Range   Lactic Acid, Venous 1.8 0.5 - 1.9 mmol/L    Comment: Performed at [facility], [address] [place], [place] [address]  Culture, blood (Routine x 2)     Status: None (Preliminary result)   Collection Time: 02/15/22  1:09 PM   Specimen: BLOOD  Result Value Ref Range   Specimen Description      BLOOD BLOOD RIGHT HAND Performed at [facility] [address] [place], [place] [address]    Special Requests      BOTTLES DRAWN AEROBIC ONLY Blood Culture adequate volume Performed at [facility] [address], [place], [place] [address]    Culture      NO GROWTH < 24 HOURS Performed at [facility] [address] [place], [place] [address]    Report Status PENDING   Magnesium     Status: None   Collection Time: 02/15/22  1:09 PM  Result Value Ref Range   Magnesium 1.7 1.7 - 2.4 mg/dL    Comment: Performed at [facility], [place] [address] [place], [place] [address]  Procalcitonin - Baseline     Status: None   Collection Time: 02/15/22  1:09 PM  Result Value Ref Range   Procalcitonin <0.10 ng/mL    Comment:        Interpretation: PCT (Procalcitonin) <= 0.5 ng/mL: Systemic infection (sepsis) is not likely. Local bacterial infection is possible. (NOTE)       Sepsis PCT Algorithm           Lower Respiratory Tract                                      Infection PCT Algorithm    ----------------------------     ----------------------------         PCT < 0.25 ng/mL                PCT < 0.10 ng/mL          Strongly encourage             Strongly discourage    discontinuation of antibiotics    initiation of antibiotics    ----------------------------     -----------------------------       PCT 0.25 - 0.50 ng/mL            PCT 0.10 - 0.25 ng/mL               OR       >80% decrease in PCT            Discourage initiation of  antibiotics      Encourage discontinuation           of antibiotics    ----------------------------     -----------------------------         PCT >= 0.50 ng/mL              PCT 0.26 - 0.50 ng/mL               AND        <80% decrease in PCT             Encourage initiation of                                             antibiotics       Encourage continuation           of antibiotics    ----------------------------     -----------------------------        PCT >= 0.50 ng/mL                  PCT > 0.50 ng/mL               AND         increase in PCT                  Strongly encourage                                      initiation of antibiotics    Strongly encourage escalation           of antibiotics                                     -----------------------------                                           PCT <= 0.25 ng/mL                                                 OR                                        > 80% decrease in PCT                                      Discontinue / Do not initiate                                             antibiotics  Performed at [facility] [address], [place], [place] [address]   Hemoglobin A1c     Status: Abnormal   Collection Time:  02/15/22  1:09 PM  Result Value Ref Range   Hgb A1c MFr Bld 6.1 (H) 4.8 - 5.6 %    Comment: (NOTE) Pre diabetes:          5.7%-6.4%  Diabetes:              >6.4%  Glycemic control for   <7.0% adults with diabetes    Mean Plasma Glucose 128.37 mg/dL    Comment: Performed at [facility] [address], [place], [place] [address]  Lactic acid, plasma     Status:  Abnormal   Collection Time: 02/15/22  1:22 PM  Result Value Ref Range   Lactic Acid, Venous 2.0 (HH) 0.5 - 1.9 mmol/L    Comment: CRITICAL RESULT CALLED TO, READ BACK BY AND VERIFIED WITH [place], B @ [place] [place] Performed at [facility], [place] [address] [place], [place] [address]   Comprehensive metabolic panel     Status: Abnormal   Collection Time: 02/16/22  4:23 AM  Result Value Ref Range   Sodium 142 135 - 145 mmol/L   Potassium 3.9 3.5 - 5.1 mmol/L   Chloride 105 98 - 111 mmol/L   CO2 28 22 - 32 mmol/L   Glucose, Bld 119 (H) 70 - 99 mg/dL    Comment: Glucose reference range applies only to samples taken after fasting for at least 8 hours.   BUN 14 6 - 20 mg/dL   Creatinine, Ser 0.89 0.44 - 1.00 mg/dL   Calcium 9.3 8.9 - 10.3 mg/dL   Total Protein 7.7 6.5 - 8.1 g/dL   Albumin 3.4 (L) 3.5 - 5.0 g/dL   AST 14 (L) 15 - 41 U/L   ALT 13 0 - 44 U/L   Alkaline Phosphatase 57 38 - 126 U/L   Total Bilirubin 0.4 0.3 - 1.2 mg/dL   GFR, Estimated >60 >60 mL/min    Comment: (NOTE) Calculated using the CKD-EPI Creatinine Equation (2021)    Anion gap 9 5 - 15    Comment: Performed at [facility], [place] [address] [place], [place] [address]  CBC     Status: Abnormal   Collection Time: 02/16/22  4:23 AM  Result Value Ref Range   WBC 6.7 4.0 - 10.5 K/uL   RBC 4.10 3.87 - 5.11 MIL/uL   Hemoglobin 10.6 (L) 12.0 - 15.0 g/dL   HCT 35.2 (L) 36.0 - 46.0 %   MCV 85.9 80.0 - 100.0 fL   MCH 25.9 (L) 26.0 - 34.0 pg   MCHC 30.1 30.0 - 36.0 g/dL   RDW 15.8 (H) 11.5 - 15.5 %   Platelets 286 150 - 400 K/uL   nRBC 0.0 0.0 - 0.2 %    Comment: Performed at [facility], [place] [address], [place],  [address]    Imaging / Studies: CT ABDOMEN PELVIS W CONTRAST  Result Date: 02/15/2022 CLINICAL DATA:  Abdominal pain since Wednesday. History of Crohn disease. Prior abdominal perineal resection for rectal cancer. Suspicion of bowel  obstruction. * Tracking Code: BO * EXAM: CT ABDOMEN AND PELVIS WITH CONTRAST TECHNIQUE: Multidetector CT imaging of the abdomen and pelvis was performed using the standard protocol following bolus administration of intravenous contrast. RADIATION DOSE REDUCTION: This exam was performed according to the departmental dose-optimization program which includes automated exposure control, adjustment of the mA and/or kV according to patient size and/or use of iterative reconstruction technique. CONTRAST:  184m OMNIPAQUE IOHEXOL 300 MG/ML  SOLN COMPARISON:  10/12/2020 FINDINGS: Lower chest: Clear lung bases. Normal heart size without pericardial or pleural effusion. Hepatobiliary: Focal steatosis adjacent the falciform ligament. Normal gallbladder, without biliary ductal dilatation. Pancreas: Normal, without mass or ductal dilatation. Spleen: Normal in size, without focal abnormality. Adrenals/Urinary Tract: Normal adrenal glands. Development of moderate bilateral hydroureteronephrosis. Followed to the level of the mid pelvis. The bladder is mildly thick walled. Stomach/Bowel: Normal stomach, without wall thickening. Descending colostomy consistent with prior abdominal perineal resection. Small bowel loops are dilated and fluid-filled including up to 4.0 cm on 55/2. Suspect a pelvic transition including on 69 through 61 of series 2. No obstructive mass identified. No complicating ischemia. Vascular/Lymphatic: Aortic atherosclerosis. Right common iliac node measures 9 mm on 67/2 and is new. Left common iliac node measures 9 mm on 62/2 and is significantly enlarged since the prior. Reproductive: Prior hysterectomy per report. Left adnexal cystic lesions of up to 2.5 cm including on 79/2, slightly increased compared to 10/12/2020. Other: Presacral soft tissue and fluid density including on 83/2. trace gas within on 85/2. Especially when comparing sagittal image 80 to the prior exam, suspect increased caudal extension. Foci of  gas also identified within more cephalad extension of presacral soft tissue thickening, including on 76/2, new. No free intraperitoneal air. No evidence of omental or peritoneal disease. Musculoskeletal: L4-5 trans pedicle screw fixation. Advanced degenerate disc disease at this level as well as L3-4. IMPRESSION: 1. Partial small bowel obstruction with transition point in the pelvis, most likely due to adhesions. 2. Development of bilateral hydroureteronephrosis, followed to the level of the pelvis. Possibly related to scarring in this patient who is status post abdominal perineal resection for presumed rectal carcinoma. However, given below findings, residual or recurrent disease is possible. 3. Subtly progressive presacral soft tissue thickening with areas of small volume gas within. Although findings could represent fistulous communication to bowel and resultant inflammation, local recurrence cannot be excluded. Consider further evaluation with PET. 4. Enlargement of small pelvic nodes, reactive or indicative of early metastasis. 5. Enlargement of left pelvic/adnexal cystic lesions, possibly residual ovarian follicles or cysts versus postoperative entities such as peritoneal inclusion cysts. 6. Mildly thick walled bladder, possibly representing cystitis. 7.  Aortic Atherosclerosis (ICD10-I70.0). Electronically Signed   By: Abigail Miyamoto M.D.   On: 02/15/2022 05:26   DG Chest Port 1 View  Result Date: 02/15/2022 CLINICAL DATA:  Abdominal pain. EXAM: PORTABLE CHEST 1 VIEW COMPARISON:  02/03/2018. FINDINGS: The heart size and mediastinal contours are within normal limits. There is atherosclerotic calcification of the aorta. No consolidation, effusion, or pneumothorax. No acute osseous abnormality. IMPRESSION: No active disease. Electronically Signed   By: Brett Fairy M.D.   On: 02/15/2022 03:49    Medications / Allergies: per chart  Antibiotics: Anti-infectives (From admission, onward)    None          Note: Portions of this report may have been transcribed using voice recognition software. Every effort was made to ensure accuracy; however, inadvertent computerized transcription errors may be present.   Any transcriptional errors that result from this process are unintentional.    [name], MD, FACS, MASCRS Esophageal, Gastrointestinal & Colorectal Surgery Robotic and Minimally Invasive Surgery  [facility] [address], [place], [place] [address] [phone] Fax [phone] [address]  CONTACT INFORMATION:  Weekday (9AM-5PM): Call [facility] main office at [phone]  Weeknight (5PM-9AM) or Weekend/Holiday: Check [email or web address] (password " [email or web address]") for General  Surgery [facility] coverage  (Please, do not use SecureChat as it is not reliable communication to reach operating surgeons for immediate patient care)      02/16/2022  10:03 AM

## 2022-02-16 NOTE — Progress Notes (Signed)
PROGRESS NOTE    Lindsay Little  JME:[id number] DOB: October 09, 1965 DOA: 02/15/2022 PCP: [name], MD     Brief Narrative:   colorectal cancer s/p resection with colostomy, mixed urinary incontinence, HTN, hypothyroidism. Presenting with N/V, found to have pSBO  Subjective:  Having colostomy output but still not comfortable  Assessment & Plan:  Principal Problem:   Partial small bowel obstruction (HCC) Active Problems:   Pure hypercholesterolemia   Obesity, Class III, BMI 40-49.9 (morbid obesity) ([place])   Rectal adenocarcinoma s/p LAR resection 05/25/2015   Hypertension   Hypokalemia   Colostomy in place [facility])   SBO (small bowel obstruction) (HCC)   Hypothyroidism   GERD (gastroesophageal reflux disease)   Hyperglycemia   Lactic acidosis   Urinary incontinence, mixed   Bilateral hydronephrosis    Assessment and Plan:   pSBO -did not require ng, having colostomy output but still not comfortable -gen surg following, will follow recommendation  Hypokalemia/hypomagnesemia Mild, replace, recheck  Lactic acidosis Mild, continue hydration, repeat in the morning   B/l hydronephrosis Hx of mixed urinary incontinence Hx of rectovaginal fistula     - no recent imaging to compare hydronephrosis     - recent exam under anesthesia with [facility] to exam cause of urinary incontinence; r/o'd vesicovaginal fistula; showed severely fibrotic appearing bladder w/ decreased capacity (~100cc) with a widely open bladder neck and no obvious fistulas; vaginal vault was shortened w/ scarring and showed potential areas of her previously know rectovaginal fistula     - spoke with [facility] (Dr. Jeffie Pollock); rec'd just monitoring for now as her renal function is normal and she still has urinary outpt; rec'd follow up US in a couple of day    Hx of colorectal CA s/p resection; colostomy in place     - colostomy functional and has good outpt  HTN/HLD Resume home meds  once able to take p.o.  Impaired fasting blood glucose No prior diagnosis of diabetes From stress? A1c 6.1 Recommend weight loss,diet and exercise,  Need PCP follow-up    Morbid obesity Body mass index is 43.66 kg/m.        Hypothyroidism     - resume home regimen when she is cleared for PO   GERD     - pepcid      I have Reviewed nursing notes, Vitals, pain scores, I/o's, Lab results and  imaging results since pt's last encounter, details please see discussion above  I ordered the following labs:  Unresulted Labs (From admission, onward)     Start     Ordered   02/17/22 2297  Basic metabolic panel  Tomorrow morning,   R        02/16/22 1054   02/17/22 0500  CBC with Differential/Platelet  Tomorrow morning,   R        02/16/22 1621   02/17/22 0500  Magnesium  Tomorrow morning,   R        02/16/22 1621   02/17/22 0500  Lactic acid, plasma  Tomorrow morning,   R        02/16/22 1621   02/15/22 1428  Urinalysis, Routine w reflex microscopic  Once,   R        02/15/22 1429             DVT prophylaxis:   Lovenox    Code Status:   Code Status: Full Code  Family Communication: Patient Disposition:    Dispo: The patient is from:  Home              Anticipated d/c is to: Home              Anticipated d/c date is: Once able to tolerate diet advancement, need general surgery clearance  Antimicrobials:    Anti-infectives (From admission, onward)    None          Objective: Vitals:   02/16/22 0122 02/16/22 0544 02/16/22 1016 02/16/22 1351  BP: 138/73 (!) 152/72 (!) 133/97 (!) 158/84  Pulse: 68 77 63 63  Resp: 18 18 18 18   Temp: 98.7 F (37.1 C) 98.6 F (37 C) 98.4 F (36.9 C) 98.7 F (37.1 C)  TempSrc: Oral Oral Oral Oral  SpO2: 100% 100% 93% 95%  Weight:      Height:        Intake/Output Summary (Last 24 hours) at 02/16/2022 1631 Last data filed at 02/16/2022 1400 Gross per 24 hour  Intake 2711.08 ml  Output 700 ml  Net 2011.08 ml   Filed  Weights   02/15/22 1830  Weight: (!) 140 kg    Examination:  General exam: Does not not look comfortable Respiratory system: Clear to auscultation. Respiratory effort normal. Cardiovascular system:  RRR.  Gastrointestinal system: Protuberant abdomen , + colostomy , + bs, mild tender. Central nervous system: Alert and oriented. No focal neurological deficits. Extremities:  no edema Skin: No rashes, lesions or ulcers Psychiatry: Anxious    Data Reviewed: I have personally reviewed  labs and visualized  imaging studies since the last encounter and formulate the plan        Scheduled Meds:  bisoprolol  10 mg Oral Daily   enoxaparin (LOVENOX) injection  70 mg Subcutaneous Q24H   lip balm   Topical BID   melatonin  6 mg Oral QHS   ondansetron  4 mg Oral TID AC & HS   polyethylene glycol  17 g Oral BID   sodium chloride flush  3 mL Intravenous Q12H   Continuous Infusions:  sodium chloride     famotidine (PEPCID) IV Stopped (02/16/22 0932)   lactated ringers     lactated ringers 100 mL/hr at 02/16/22 1351   levETIRAcetam 250 mg (02/16/22 1348)   methocarbamol (ROBAXIN) IV       LOS: 0 days     Florencia Reasons, MD PhD FACP [facility]  Available via Epic secure chat 7am-7pm for nonurgent issues Please page for urgent issues To page the attending provider between 7A-7P or the covering provider during after hours 7P-7A, please log into the web site [email or web address] and access using universal [place] password for that web site. If you do not have the password, please call the hospital operator.    02/16/2022, 4:31 PM

## 2022-02-17 ENCOUNTER — Encounter (HOSPITAL_COMMUNITY): Payer: Self-pay | Admitting: Internal Medicine

## 2022-02-17 DIAGNOSIS — K56600 Partial intestinal obstruction, unspecified as to cause: Secondary | ICD-10-CM | POA: Diagnosis not present

## 2022-02-17 LAB — BASIC METABOLIC PANEL
Anion gap: 7 (ref 5–15)
BUN: 8 mg/dL (ref 6–20)
CO2: 24 mmol/L (ref 22–32)
Calcium: 9.3 mg/dL (ref 8.9–10.3)
Chloride: 108 mmol/L (ref 98–111)
Creatinine, Ser: 0.81 mg/dL (ref 0.44–1.00)
GFR, Estimated: >60 mL/min (ref >60)
Glucose, Bld: 97 mg/dL (ref 70–99)
Potassium: 3.4 mmol/L — ABNORMAL LOW (ref 3.5–5.1)
Sodium: 139 mmol/L (ref 135–145)

## 2022-02-17 LAB — CBC WITH DIFFERENTIAL/PLATELET
Abs Immature Granulocytes: 0.03 10*3/uL (ref 0.00–0.07)
Basophils Absolute: 0 10*3/uL (ref 0.0–0.1)
Basophils Relative: 0 %
Eosinophils Absolute: 0.1 10*3/uL (ref 0.0–0.5)
Eosinophils Relative: 1 %
HCT: 35.8 % — ABNORMAL LOW (ref 36.0–46.0)
Hemoglobin: 11.1 g/dL — ABNORMAL LOW (ref 12.0–15.0)
Immature Granulocytes: 0 %
Lymphocytes Relative: 25 %
Lymphs Abs: 1.9 10*3/uL (ref 0.7–4.0)
MCH: 26.4 pg (ref 26.0–34.0)
MCHC: 31 g/dL (ref 30.0–36.0)
MCV: 85.2 fL (ref 80.0–100.0)
Monocytes Absolute: 0.7 10*3/uL (ref 0.1–1.0)
Monocytes Relative: 9 %
Neutro Abs: 5 10*3/uL (ref 1.7–7.7)
Neutrophils Relative %: 65 %
Platelets: 260 10*3/uL (ref 150–400)
RBC: 4.2 MIL/uL (ref 3.87–5.11)
RDW: 15.7 % — ABNORMAL HIGH (ref 11.5–15.5)
WBC: 7.7 10*3/uL (ref 4.0–10.5)
nRBC: 0 % (ref 0.0–0.2)

## 2022-02-17 LAB — MAGNESIUM: Magnesium: 1.5 mg/dL — ABNORMAL LOW (ref 1.7–2.4)

## 2022-02-17 LAB — LACTIC ACID, PLASMA: Lactic Acid, Venous: 1.3 mmol/L (ref 0.5–1.9)

## 2022-02-17 MED ORDER — PREGABALIN 75 MG PO CAPS
225.0000 mg | ORAL_CAPSULE | Freq: Two times a day (BID) | ORAL | Status: DC
  Administered 2022-02-17 – 2022-02-18 (×3): 225 mg via ORAL
  Filled 2022-02-17 (×3): qty 3

## 2022-02-17 MED ORDER — LEVETIRACETAM 250 MG PO TABS
250.0000 mg | ORAL_TABLET | Freq: Two times a day (BID) | ORAL | Status: DC
  Administered 2022-02-17 – 2022-02-18 (×2): 250 mg via ORAL
  Filled 2022-02-17 (×2): qty 1

## 2022-02-17 MED ORDER — POTASSIUM CHLORIDE CRYS ER 20 MEQ PO TBCR
40.0000 meq | EXTENDED_RELEASE_TABLET | Freq: Every day | ORAL | Status: DC
  Filled 2022-02-17: qty 2

## 2022-02-17 MED ORDER — FAMOTIDINE 20 MG PO TABS
20.0000 mg | ORAL_TABLET | Freq: Two times a day (BID) | ORAL | Status: DC
Start: 2022-02-17 — End: 2022-02-18
  Administered 2022-02-17 – 2022-02-18 (×2): 20 mg via ORAL
  Filled 2022-02-17 (×2): qty 1

## 2022-02-17 MED ORDER — LEVOTHYROXINE SODIUM 50 MCG PO TABS
50.0000 ug | ORAL_TABLET | Freq: Every morning | ORAL | Status: DC
Start: 2022-02-17 — End: 2022-02-18
  Administered 2022-02-17 – 2022-02-18 (×2): 50 ug via ORAL
  Filled 2022-02-17 (×2): qty 1

## 2022-02-17 MED ORDER — ROSUVASTATIN CALCIUM 5 MG PO TABS
5.0000 mg | ORAL_TABLET | Freq: Every day | ORAL | Status: DC
  Administered 2022-02-17: 5 mg via ORAL
  Filled 2022-02-17: qty 1

## 2022-02-17 MED ORDER — DULOXETINE HCL 60 MG PO CPEP
120.0000 mg | ORAL_CAPSULE | Freq: Every day | ORAL | Status: DC
  Administered 2022-02-17 – 2022-02-18 (×2): 120 mg via ORAL
  Filled 2022-02-17 (×2): qty 2

## 2022-02-17 MED ORDER — MAGNESIUM SULFATE 4 GM/100ML IV SOLN
4.0000 g | Freq: Once | INTRAVENOUS | Status: AC
  Administered 2022-02-17: 4 g via INTRAVENOUS
  Filled 2022-02-17 (×2): qty 100

## 2022-02-17 MED ORDER — POTASSIUM CHLORIDE 10 MEQ/100ML IV SOLN
10.0000 meq | INTRAVENOUS | Status: AC
  Administered 2022-02-17 (×4): 10 meq via INTRAVENOUS
  Filled 2022-02-17 (×4): qty 100

## 2022-02-17 NOTE — Progress Notes (Signed)
PROGRESS NOTE    Lindsay Little  JOA:[id number] DOB: [birth year] DOA: 02/15/2022 PCP: [name], MD     Brief Narrative:   colorectal cancer s/p resection with colostomy, mixed urinary incontinence, HTN, hypothyroidism. Presenting with N/V, found to have pSBO  Subjective:  Feeling better,   Assessment & Plan:  Principal Problem:   Partial small bowel obstruction (HCC) Active Problems:   Pure hypercholesterolemia   Obesity, Class III, BMI 40-49.9 (morbid obesity) (HCC)   Rectal adenocarcinoma s/p LAR resection 05/25/2015   Hypertension   Depression   Hypokalemia   Colostomy in place [facility])   Chronic pain syndrome   Discitis, unspecified, lumbar region   SBO (small bowel obstruction) (HCC)   Hypothyroidism   GERD (gastroesophageal reflux disease)   Hyperglycemia   Lactic acidosis   Urinary incontinence, mixed   Bilateral hydronephrosis    Assessment and Plan:   pSBO -did not require ng, having colostomy output ,feeling better -gen surg following, diet advancement per gen surg  Hypokalemia/hypomagnesemia Remain low, continue to replace She does not like oral potassium, replace K /mag through IV  Lactic acidosis Resolved after hydration   B/l hydronephrosis Hx of mixed urinary incontinence Hx of rectovaginal fistula     - no recent imaging to compare hydronephrosis     - recent exam under anesthesia with [facility] to exam cause of urinary incontinence; r/o'd vesicovaginal fistula; showed severely fibrotic appearing bladder w/ decreased capacity (~100cc) with a widely open bladder neck and no obvious fistulas; vaginal vault was shortened w/ scarring and showed potential areas of her previously know rectovaginal fistula     - spoke with [facility] (Dr. Jeffie Pollock); rec'd just monitoring for now as her renal function is normal and she still has urinary outpt; rec'd follow up US in a couple of day    Hx of colorectal CA s/p resection; colostomy in  place     - colostomy functional and has good outpt  HTN/HLD home meds resumed .  Impaired fasting blood glucose No prior diagnosis of diabetes From stress? A1c 6.1 Recommend weight loss,diet and exercise,  Need PCP follow-up    Morbid obesity Body mass index is 43.66 kg/m.        Hypothyroidism     -  home meds resumed    GERD     - pepcid      I have Reviewed nursing notes, Vitals, pain scores, I/o's, Lab results and  imaging results since pt's last encounter, details please see discussion above  I ordered the following labs:  Unresulted Labs (From admission, onward)     Start     Ordered   02/18/22 6010  Basic metabolic panel  Tomorrow morning,   R        02/17/22 1754   02/18/22 0500  Magnesium  Tomorrow morning,   R        02/17/22 1754   02/15/22 1428  Urinalysis, Routine w reflex microscopic  Once,   R        02/15/22 1429             DVT prophylaxis:   Lovenox    Code Status:   Code Status: Full Code  Family Communication: Patient Disposition:    Dispo: The patient is from: Home              Anticipated d/c is to: Home              Anticipated d/c date  is: Once able to tolerate diet advancement, need general surgery clearance  Antimicrobials:    Anti-infectives (From admission, onward)    None          Objective: Vitals:   02/16/22 1351 02/16/22 2125 02/17/22 0555 02/17/22 1329  BP: (!) 158/84 139/84 (!) 158/83 (!) 144/60  Pulse: 63 (!) 55 (!) 53 (!) 44  Resp: 18 18 18 15   Temp: 98.7 F (37.1 C) 98.1 F (36.7 C) 98.5 F (36.9 C) 97.7 F (36.5 C)  TempSrc: Oral Oral Oral Oral  SpO2: 95% 100% 97% 98%  Weight:      Height:        Intake/Output Summary (Last 24 hours) at 02/17/2022 1755 Last data filed at 02/17/2022 1705 Gross per 24 hour  Intake 2028.05 ml  Output 1300 ml  Net 728.05 ml   Filed Weights   02/15/22 1830  Weight: (!) 140 kg    Examination:  General exam: Looks much better Respiratory system: Clear  to auscultation. Respiratory effort normal. Cardiovascular system:  RRR.  Gastrointestinal system: Protuberant abdomen , + colostomy , + bs, nontender  Central nervous system: Alert and oriented. No focal neurological deficits. Extremities:  no edema Skin: No rashes, lesions or ulcers Psychiatry: Pleasant    Data Reviewed: I have personally reviewed  labs and visualized  imaging studies since the last encounter and formulate the plan        Scheduled Meds:  bisoprolol  10 mg Oral Daily   DULoxetine  120 mg Oral Daily   enoxaparin (LOVENOX) injection  70 mg Subcutaneous Q24H   famotidine  20 mg Oral BID   levETIRAcetam  250 mg Oral BID   levothyroxine  50 mcg Oral q morning   lip balm   Topical BID   melatonin  6 mg Oral QHS   ondansetron  4 mg Oral TID AC & HS   polyethylene glycol  17 g Oral BID   pregabalin  225 mg Oral BID   rosuvastatin  5 mg Oral QHS   sodium chloride flush  3 mL Intravenous Q12H   Continuous Infusions:  sodium chloride     lactated ringers     methocarbamol (ROBAXIN) IV       LOS: 1 day     Florencia Reasons, MD PhD FACP [facility]  Available via Epic secure chat 7am-7pm for nonurgent issues Please page for urgent issues To page the attending provider between 7A-7P or the covering provider during after hours 7P-7A, please log into the web site [email or web address] and access using universal  password for that web site. If you do not have the password, please call the hospital operator.    02/17/2022, 5:55 PM

## 2022-02-17 NOTE — Progress Notes (Signed)
Lindsay Little [id number] 09/13/1965  CARE TEAM:  PCP: [name], MD  Outpatient Care Team: Patient Care Team: [name], MD as PCP - General (Family Medicine) Jerline Pain, MD as PCP - Cardiology (Cardiology) Michael Boston, MD as Consulting Physician (General Surgery) Truitt Merle, MD as Consulting Physician (Medical Oncology) Kyung Rudd, MD as Consulting Physician (Radiation Oncology) Teena Irani, MD (Inactive) as Consulting Physician (Gastroenterology) Raynelle Bring, MD as Consulting Physician (Urology) Eustace Moore, MD as Consulting Physician (Neurosurgery)  Inpatient Treatment Team: Treatment Team: Attending Provider: Florencia Reasons, MD; Consulting Physician: Edison Pace, Md, MD; Rounding Team: Fatima Blank, MD; Technician: Constance Goltz, NT; Registered Nurse: Alva Garnet, RN; Utilization Review: Claudie Leach, RN; Pharmacist: Emiliano Dyer, [facility]   Problem List:   Principal Problem:   Partial small bowel obstruction [facility]) Active Problems:   Rectal adenocarcinoma s/p LAR resection 05/25/2015   Obesity, Class III, BMI 40-49.9 (morbid obesity) ([place])   Pure hypercholesterolemia   Hypertension   Hypokalemia   Colostomy in place [facility])   SBO (small bowel obstruction) ([place])   Hypothyroidism   GERD (gastroesophageal reflux disease)   Hyperglycemia   Lactic acidosis   Urinary incontinence, mixed   Bilateral hydronephrosis      * No surgery found *      Assessment  Chronic PSBO slowly resolving  [facility] Stay = 1 days)  Plan:  Patient quite familiar to me since I have involved in care with her for many years.  -Unfortunately she has quite a hostile abdomen given her prior radiation and surgery for cancer complicated by delayed leak and fistula formation requiring omental plan and ultimately fecal diversion with permanent end colostomy, then parastomal hernia requiring mesh repair.  To my view, her chronic pelvic thickening seems stable.  It is 6 and  half years since resection of her rectal cancer so I am hopeful she does not have local recurrence but certainly concern.  I think she has chronic inflammation down her pelvis I do not know how accurate a PET scan would be but could defer to Dr. Burr Medico and see what she feels.  Transition point seems to be in the suprapubic retroperitoneum.  Not completely obstructing.    Feeling better intolerant clears.  Try dysphagia 1/full liquid diet.  Hopefully advance to soft diet later.  Low threshold to keep on blenderized diet for the next week or so given her issues of prior bowel obstruction and extensive adhesions and radiation.  I*recommend she stay on some type of bowel regimen since she had needs chronic narcotics and has had multiple adhesions.  I wrote for MiraLAX twice daily to start with as needed for breakthrough.  I think that would help minimize future issues.    Chronic nausea control -consider weaning off tomorrow if advancing better  Chronic pain regimen.  I reordered her Lyrica and Cymbalta.  Would be nice to get her off chronic narcotics if possible to avoid future episodes of constipation/obstruction.  Could consider palliative care consult for recommendations and options although it is challenging with her.  Management of her other numerous medical problems per primary service.  -VTE prophylaxis- SCDs, etc  -mobilize as tolerated to help recovery  Disposition:  Disposition:  The patient is from: Home  Anticipate discharge to:  Home  Anticipated Date of Discharge is:  August 7,2023    Barriers to discharge:  Pending Clinical improvement (more likely than not)  Patient currently is NOT MEDICALLY STABLE for discharge from the  hospital from a surgery standpoint.      I reviewed nursing notes, hospitalist notes, last 24 h vitals and pain scores, last 48 h intake and output, last 24 h labs and trends, and last 24 h imaging results. I have reviewed this patient's available data,  including medical history, events of note, test results, etc as part of my evaluation.  A significant portion of that time was spent in counseling.  Care during the described time interval was provided by me.  This care required moderate level of medical decision making.  02/17/2022    Subjective: (Chief complaint)  Patient feeling better overall.  Tolerated liquids.  Nausea less.  Walking better  Objective:  Vital signs:  Vitals:   02/16/22 1016 02/16/22 1351 02/16/22 2125 02/17/22 0555  BP: (!) 133/97 (!) 158/84 139/84 (!) 158/83  Pulse: 63 63 (!) 55 (!) 53  Resp: 18 18 18 18   Temp: 98.4 F (36.9 C) 98.7 F (37.1 C) 98.1 F (36.7 C) 98.5 F (36.9 C)  TempSrc: Oral Oral Oral Oral  SpO2: 93% 95% 100% 97%  Weight:      Height:        Last BM Date : 02/16/22  Intake/Output   Yesterday:  08/05 0701 - 08/06 0700 In: 1465.8 [P.O.:460; I.V.:700; IV Piggyback:305.8] Out: 800 [Urine:800] This shift:  Total I/O In: 120 [P.O.:120] Out: 0   Bowel function:  Flatus: YES  BM:  YES  Drain: (No drain)   Physical Exam:  General: Pt awake/alert in no acute distress.  Much more comfortable in bed.  Smiling.  Not toxic nor sickly. Eyes: PERRL, normal EOM.  Sclera clear.  No icterus Neuro: CN II-XII intact w/o focal sensory/motor deficits. Lymph: No head/neck/groin lymphadenopathy Psych:  No delerium/psychosis/paranoia.  Oriented x 4 HENT: Normocephalic, Mucus membranes moist.  No thrush Neck: Supple, No tracheal deviation.  No obvious thyromegaly Chest: No pain to chest wall compression.  Good respiratory excursion.  No audible wheezing CV:  Pulses intact.  Regular rhythm.  No major extremity edema MS: Normal AROM mjr joints.  No obvious deformity  Abdomen: Soft.  Nondistended.  Nontender.  Colostomy on left side intact with no recurrent parastomal hernia.  Thick stool in bag.  Moderate flatus.  No evidence of peritonitis.  No incarcerated hernias.  Ext:   No  deformity.  No mjr edema.  No cyanosis Skin: No petechiae / purpurea.  No major sores.  Warm and dry    Results:   Cultures: Recent Results (from the past 720 hour(s))  Culture, blood (Routine x 2)     Status: None (Preliminary result)   Collection Time: 02/15/22  3:50 AM   Specimen: BLOOD  Result Value Ref Range Status   Specimen Description   Final    BLOOD RIGHT ANTECUBITAL BOTTLES DRAWN AEROBIC AND ANAEROBIC Blood Culture adequate volume Performed at [facility], [address], [place], [place] [address]    Special Requests   Final    NONE Performed at [facility], [address], [place], [place] [address]    Culture   Final    NO GROWTH 2 DAYS Performed at [facility], [place] [address] [place], [place] [address]    Report Status PENDING  Incomplete  Culture, blood (Routine x 2)     Status: None (Preliminary result)   Collection Time: 02/15/22  1:09 PM   Specimen: BLOOD  Result Value Ref Range Status   Specimen Description   Final  BLOOD BLOOD RIGHT HAND Performed at [facility] [address], [place], [place] [address]    Special Requests   Final    BOTTLES DRAWN AEROBIC ONLY Blood Culture adequate volume Performed at [facility] [address], [place], [place] [address]    Culture   Final    NO GROWTH 2 DAYS Performed at [facility] [address] [place], [place] [address]    Report Status PENDING  Incomplete    Labs: Results for orders placed or performed during the hospital encounter of 02/15/22 (from the past 48 hour(s))  Culture, blood (Routine x 2)     Status: None (Preliminary result)   Collection Time: 02/15/22  1:09 PM   Specimen: BLOOD  Result Value Ref Range   Specimen Description      BLOOD BLOOD RIGHT HAND Performed at [facility] [address] [place], [place] [address]    Special Requests      BOTTLES DRAWN AEROBIC  ONLY Blood Culture adequate volume Performed at [facility] [address] [place], [place] [address]    Culture      NO GROWTH 2 DAYS Performed at [facility], [place] [address], [place], [place] [address]    Report Status PENDING   Magnesium     Status: None   Collection Time: 02/15/22  1:09 PM  Result Value Ref Range   Magnesium 1.7 1.7 - 2.4 mg/dL    Comment: Performed at [facility], [place] [address] [place], [place] [address]  Procalcitonin - Baseline     Status: None   Collection Time: 02/15/22  1:09 PM  Result Value Ref Range   Procalcitonin <0.10 ng/mL    Comment:        Interpretation: PCT (Procalcitonin) <= 0.5 ng/mL: Systemic infection (sepsis) is not likely. Local bacterial infection is possible. (NOTE)       Sepsis PCT Algorithm           Lower Respiratory Tract                                      Infection PCT Algorithm    ----------------------------     ----------------------------         PCT < 0.25 ng/mL                PCT < 0.10 ng/mL          Strongly encourage             Strongly discourage   discontinuation of antibiotics    initiation of antibiotics    ----------------------------     -----------------------------       PCT 0.25 - 0.50 ng/mL            PCT 0.10 - 0.25 ng/mL               OR       >80% decrease in PCT            Discourage initiation of                                            antibiotics      Encourage discontinuation  of antibiotics    ----------------------------     -----------------------------         PCT >= 0.50 ng/mL              PCT 0.26 - 0.50 ng/mL               AND        <80% decrease in PCT             Encourage initiation of                                             antibiotics       Encourage continuation           of antibiotics    ----------------------------     -----------------------------        PCT >= 0.50 ng/mL                  PCT > 0.50 ng/mL                AND         increase in PCT                  Strongly encourage                                      initiation of antibiotics    Strongly encourage escalation           of antibiotics                                     -----------------------------                                           PCT <= 0.25 ng/mL                                                 OR                                        > 80% decrease in PCT                                      Discontinue / Do not initiate                                             antibiotics  Performed at [facility] [address] [place], [place] [address]   Hemoglobin A1c     Status: Abnormal   Collection Time: 02/15/22  1:09 PM  Result Value Ref Range   Hgb A1c MFr Bld 6.1 (H)  4.8 - 5.6 %    Comment: (NOTE) Pre diabetes:          5.7%-6.4%  Diabetes:              >6.4%  Glycemic control for   <7.0% adults with diabetes    Mean Plasma Glucose 128.37 mg/dL    Comment: Performed at [facility], [place] [address] [place], [place] [address]  Lactic acid, plasma     Status: Abnormal   Collection Time: 02/15/22  1:22 PM  Result Value Ref Range   Lactic Acid, Venous 2.0 (HH) 0.5 - 1.9 mmol/L    Comment: CRITICAL RESULT CALLED TO, READ BACK BY AND VERIFIED WITH [place], B @ [place] Performed at [facility], [place] [address], [place], [place] [address]   HIV Antibody (routine testing w rflx)     Status: None   Collection Time: 02/16/22  4:23 AM  Result Value Ref Range   HIV Screen 4th Generation wRfx Non Reactive Non Reactive    Comment: Performed at [facility], [place] [address], , [place] [address]  Comprehensive metabolic panel     Status: Abnormal   Collection Time: 02/16/22  4:23 AM  Result Value Ref Range   Sodium 142 135 - 145 mmol/L   Potassium 3.9 3.5 - 5.1 mmol/L   Chloride 105 98 - 111 mmol/L   CO2 28 22 - 32 mmol/L   Glucose, Bld 119 (H)  70 - 99 mg/dL    Comment: Glucose reference range applies only to samples taken after fasting for at least 8 hours.   BUN 14 6 - 20 mg/dL   Creatinine, Ser 0.89 0.44 - 1.00 mg/dL   Calcium 9.3 8.9 - 10.3 mg/dL   Total Protein 7.7 6.5 - 8.1 g/dL   Albumin 3.4 (L) 3.5 - 5.0 g/dL   AST 14 (L) 15 - 41 U/L   ALT 13 0 - 44 U/L   Alkaline Phosphatase 57 38 - 126 U/L   Total Bilirubin 0.4 0.3 - 1.2 mg/dL   GFR, Estimated >60 >60 mL/min    Comment: (NOTE) Calculated using the CKD-EPI Creatinine Equation (2021)    Anion gap 9 5 - 15    Comment: Performed at [facility], [place] [address], [place], [place] [address]  CBC     Status: Abnormal   Collection Time: 02/16/22  4:23 AM  Result Value Ref Range   WBC 6.7 4.0 - 10.5 K/uL   RBC 4.10 3.87 - 5.11 MIL/uL   Hemoglobin 10.6 (L) 12.0 - 15.0 g/dL   HCT 35.2 (L) 36.0 - 46.0 %   MCV 85.9 80.0 - 100.0 fL   MCH 25.9 (L) 26.0 - 34.0 pg   MCHC 30.1 30.0 - 36.0 g/dL   RDW 15.8 (H) 11.5 - 15.5 %   Platelets 286 150 - 400 K/uL   nRBC 0.0 0.0 - 0.2 %    Comment: Performed at [facility], [place] [address] [place], [place] [address]  Basic metabolic panel     Status: Abnormal   Collection Time: 02/17/22  5:07 AM  Result Value Ref Range   Sodium 139 135 - 145 mmol/L   Potassium 3.4 (L) 3.5 - 5.1 mmol/L   Chloride 108 98 - 111 mmol/L   CO2 24 22 - 32 mmol/L   Glucose, Bld 97 70 - 99 mg/dL    Comment: Glucose reference range applies only to samples  taken after fasting for at least 8 hours.   BUN 8 6 - 20 mg/dL   Creatinine, Ser 0.81 0.44 - 1.00 mg/dL   Calcium 9.3 8.9 - 10.3 mg/dL   GFR, Estimated >60 >60 mL/min    Comment: (NOTE) Calculated using the CKD-EPI Creatinine Equation (2021)    Anion gap 7 5 - 15    Comment: Performed at [facility], [place] [address] [place], [place] [address]  CBC with Differential/Platelet     Status: Abnormal   Collection Time: 02/17/22  5:07 AM  Result Value  Ref Range   WBC 7.7 4.0 - 10.5 K/uL   RBC 4.20 3.87 - 5.11 MIL/uL   Hemoglobin 11.1 (L) 12.0 - 15.0 g/dL   HCT 35.8 (L) 36.0 - 46.0 %   MCV 85.2 80.0 - 100.0 fL   MCH 26.4 26.0 - 34.0 pg   MCHC 31.0 30.0 - 36.0 g/dL   RDW 15.7 (H) 11.5 - 15.5 %   Platelets 260 150 - 400 K/uL   nRBC 0.0 0.0 - 0.2 %   Neutrophils Relative % 65 %   Neutro Abs 5.0 1.7 - 7.7 K/uL   Lymphocytes Relative 25 %   Lymphs Abs 1.9 0.7 - 4.0 K/uL   Monocytes Relative 9 %   Monocytes Absolute 0.7 0.1 - 1.0 K/uL   Eosinophils Relative 1 %   Eosinophils Absolute 0.1 0.0 - 0.5 K/uL   Basophils Relative 0 %   Basophils Absolute 0.0 0.0 - 0.1 K/uL   Immature Granulocytes 0 %   Abs Immature Granulocytes 0.03 0.00 - 0.07 K/uL    Comment: Performed at [facility], [place] [address], [place], [place] [address]  Magnesium     Status: Abnormal   Collection Time: 02/17/22  5:07 AM  Result Value Ref Range   Magnesium 1.5 (L) 1.7 - 2.4 mg/dL    Comment: Performed at [facility], [place] [address], [place], [place] [address]  Lactic acid, plasma     Status: None   Collection Time: 02/17/22  5:07 AM  Result Value Ref Range   Lactic Acid, Venous 1.3 0.5 - 1.9 mmol/L    Comment: Performed at [facility], [place] [address] [place], [place] [address]    Imaging / Studies: No results found.  Medications / Allergies: per chart  Antibiotics: Anti-infectives (From admission, onward)    None         Note: Portions of this report may have been transcribed using voice recognition software. Every effort was made to ensure accuracy; however, inadvertent computerized transcription errors may be present.   Any transcriptional errors that result from this process are unintentional.    [name], MD, FACS, MASCRS Esophageal, Gastrointestinal & Colorectal Surgery Robotic and Minimally Invasive Surgery  [facility]. [address], [place], [place] [address] [phone] Fax [phone] Main  CONTACT INFORMATION:  Weekday (9AM-5PM): Call [facility] main office at [phone]  Weeknight (5PM-9AM) or Weekend/Holiday: Check [email or web address] (password " [email or web address]") for General Surgery [facility] coverage  (Please, do not use SecureChat as it is not reliable communication to reach operating surgeons for immediate patient care)      02/17/2022  9:49 AM

## 2022-02-18 ENCOUNTER — Encounter (HOSPITAL_COMMUNITY): Payer: Self-pay | Admitting: Internal Medicine

## 2022-02-18 ENCOUNTER — Other Ambulatory Visit: Payer: Self-pay | Admitting: Hematology

## 2022-02-18 DIAGNOSIS — Z85048 Personal history of other malignant neoplasm of rectum, rectosigmoid junction, and anus: Secondary | ICD-10-CM

## 2022-02-18 DIAGNOSIS — K56600 Partial intestinal obstruction, unspecified as to cause: Secondary | ICD-10-CM | POA: Diagnosis not present

## 2022-02-18 DIAGNOSIS — C20 Malignant neoplasm of rectum: Secondary | ICD-10-CM

## 2022-02-18 DIAGNOSIS — R9389 Abnormal findings on diagnostic imaging of other specified body structures: Secondary | ICD-10-CM

## 2022-02-18 LAB — BASIC METABOLIC PANEL
Anion gap: 7 (ref 5–15)
BUN: 7 mg/dL (ref 6–20)
CO2: 24 mmol/L (ref 22–32)
Calcium: 9.1 mg/dL (ref 8.9–10.3)
Chloride: 108 mmol/L (ref 98–111)
Creatinine, Ser: 0.94 mg/dL (ref 0.44–1.00)
GFR, Estimated: >60 mL/min (ref >60)
Glucose, Bld: 95 mg/dL (ref 70–99)
Potassium: 3.7 mmol/L (ref 3.5–5.1)
Sodium: 139 mmol/L (ref 135–145)

## 2022-02-18 LAB — MAGNESIUM: Magnesium: 2.2 mg/dL (ref 1.7–2.4)

## 2022-02-18 MED ORDER — POLYETHYLENE GLYCOL 3350 17 G PO PACK
17.0000 g | PACK | Freq: Every day | ORAL | 0 refills | Status: AC
Start: 2022-02-18 — End: 2022-03-20

## 2022-02-18 NOTE — Progress Notes (Signed)
Transition of Care [facility]) Screening Note  Patient Details  Name: CHASITTY HEHL Date of Birth: [birth year]  Transition of Care [facility]) CM/SW Contact:    Sherie Don, LCSW Phone Number: 02/18/2022, 9:40 AM  Transition of Care Department [facility]) has reviewed patient and no TOC needs have been identified at this time. We will continue to monitor patient advancement through interdisciplinary progression rounds. If new patient transition needs arise, please place a TOC consult.

## 2022-02-18 NOTE — Consult Note (Addendum)
[place]  [phone] Fax:([phone]) [phone]   MEDICAL ONCOLOGY - INITIAL CONSULTATION  Referral MD: Dr. Dana Allan  Reason for Referral: History of rectal cancer, presacral soft tissue thickening  HPI: Ms. Weill is a 56 year old female with a past medical history significant for T3 N2 M0, stage IIIc, ypT1N0 after neoadjuvant chemotherapy and radiation rectal adenocarcinoma, rectovaginal fistula, mixed urinary incontinence, hypertension, hypothyroidism.  She presented to the emergency department with nausea and vomiting.  She was also experiencing abdominal pain.  She was found to have a small bowel obstruction.  The patient was managed medically and symptoms resolved quickly.  As part of her work-up, she had a CT of the abdomen/pelvis with contrast which showed a partial small bowel obstruction with transition point in the pelvis most likely due to adhesions, development of bilateral hydroureteronephrosis posteriorly due to scarring but residual or recurrent disease also possibility, subtly progressive presacral soft tissue thickening with areas of small volume gas within and findings could represent fistulous communication to bowel and resultant inflammation, local recurrence cannot be excluded, enlargement of small pelvic lymph nodes which could be reactive or indicative of early metastasis.  The patient was seen by her general surgeon over the weekend who felt that the chronic pelvic thickening was stable however there could be concern for local recurrence.  Today, the patient reports that she is feeling better.  She is not having any recurrent abdominal pain, nausea, vomiting.  She states that her appetite has been good overall and that she has been gaining weight.  She has not noticed any melena or hematochezia.  She denies headaches, dizziness, chest pain, shortness of breath.  Medical oncology was asked to see the patient to make recommendations regarding her  abnormal CT scan findings.  Past Medical History:  Diagnosis Date   Anxiety    Chemotherapy-induced neuropathy (HCC)    toes and fingers numbness and tingling   Colorectal delayed anastomotic leak s/p resection & colostomy 07/12/2016 11/17/2015   Colostomy in place [facility]) 07/12/2016   due to anastomosis breakdown w/ colovaginal fistula   Colovaginal fistula s/p omentopexy repair 07/12/2016 [id number]   Complication of anesthesia    Depression    Family history of adverse reaction to anesthesia    mother-- ponv   Family history of breast cancer    Family history of pancreatic cancer    Family history of skin cancer    Gastroenteritis 12/20/2015   GERD (gastroesophageal reflux disease)    Hardware complicating wound infection ([place]) 08/03/2019   Hiatal hernia    History of cancer chemotherapy 02-20-2015 to 03-30-2015   History of cardiac murmur as a child    History of chemotherapy    History of chronic gastritis    History of hypertension    no issues since multiple abdminal sx's and chemo--- no medication since 12/ 2016   History of TMJ disorder    Hypercholesteremia    Hypertension    IBS (irritable bowel syndrome)    dx age 30   Intermittent abdominal pain    post-op multiple abdominal sx's    Microcytic anemia    Mild sleep apnea    per pt study 2014  very mild osa , no cpap recommended, recommeded wt loss and sleep routine   OA (osteoarthritis)    left knee /  left shoulder   Obesity    Parastomal hernia s/p lap repair w mesh 07/23/2018 07/23/2018   Pelvic abscess s/p drainage & omental  pedicle flap 11/17/2015 06/23/2015   PONV (postoperative nausea and vomiting)    severe" needs Scopolamine PATCH"    Portacath in place    right chest   Rectal adenocarcinoma ([place]) oncologis-  dr Burr Medico-- after radiation/ chemo (ypT1, N0) --  no recurrence per last note 03/ 2018   dx 01-13-2015-- Stage IIIC (T3, N2, M0) post concurrent radiation and chemotherapy 02-20-2015 to 03-30-2015 /   05-25-2015 s/p  LAR w/ RSO (post-op complicated by late abscess and contained anatomotic leak w/ help percutaneous drainage and antibiotics)    Rectocutaneous fistula 07/10/2016   Rotator cuff tear, left    S/P radiation therapy 02/20/15-03/30/15   colon/rectal   Vitamin D deficiency    Wears glasses    Wears glasses   :   Past Surgical History:  Procedure Laterality Date   ABDOMINAL HYSTERECTOMY  1996   uterus and cervix   BACK SURGERY  02/12/2018   lumbar surgery   COLON RESECTION N/A 07/12/2016   Procedure: LAPAROSCOPIC LYSIS OF ADHESIONS, OMENTOPEXY, HAND ASSISTED RESECTION OF  COLON, END TO END ANASTOMOSIS, COLOSTOMY;  Surgeon: Michael Boston, MD;  Location: [facility];  Service: General;  Laterality: N/A;   COMBINED HYSTEROSCOPY DIAGNOSTIC / D&C  x2 1990's   DIAGNOSTIC LAPAROSCOPY  age 71 and age 69   EUS N/A 01/18/2015   Procedure: LOWER ENDOSCOPIC ULTRASOUND (EUS);  Surgeon: Arta Silence, MD;  Location: [facility];  Service: Endoscopy;  Laterality: N/A;   EXCISION OF SKIN TAG  11/17/2015   Procedure: EXCISION OF SKIN TAG;  Surgeon: Michael Boston, MD;  [facility]: [facility];  Service: General;;   ILEO LOOP COLOSTOMY CLOSURE N/A 11/17/2015   Procedure: LAPAROSCOPIC DIVERTING LOOP ILEOSTOMY  DRAINAGE OF PELVIC ABSCESS;  Surgeon: Michael Boston, MD;  [facility]: [facility];  Service: General;  Laterality: N/A;   ILEOSTOMY CLOSURE N/A 05/31/2016   Procedure: TAKEDOWN LOOP ILEOSTOMY;  Surgeon: Michael Boston, MD;  [facility];  Service: General;  Laterality: N/A;   IMPACTION REMOVAL  11/17/2015   Procedure: DISIMPACTION REMOVAL;  Surgeon: Michael Boston, MD;  [facility];  Service: General;;   INSERTION OF MESH N/A 07/23/2018   Procedure: INSERTION OF MESH X2;  Surgeon: Michael Boston, MD;  [facility];  Service: General;  Laterality: N/A;   IR LUMBAR [place] W/IMG GUIDE  07/01/2019   KNEE ARTHROSCOPY Left 1990's   KNEE ARTHROSCOPY W/ MENISCECTOMY Left 09/14/2009   and chondroplasty  debridement   LAPAROSCOPIC LYSIS OF ADHESIONS  11/17/2015   Procedure: LAPAROSCOPIC LYSIS OF ADHESIONS;  Surgeon: Michael Boston, MD;  [facility];  Service: General;;   LUMBAR WOUND DEBRIDEMENT N/A 04/24/2018   Procedure: wound exploration, irrigation and debridement;  Surgeon: Eustace Moore, MD;  Location: [facility];  Service: Neurosurgery;  Laterality: N/A;  wound exploration, irrigation and debridement   LYSIS OF ADHESION N/A 07/23/2018   Procedure: LYSIS OF ADHESIONS;  Surgeon: Michael Boston, MD;  [facility];  Service: General;  Laterality: N/A;   PORT-A-CATH REMOVAL N/A 11/21/2016   Procedure: REMOVAL PORT-A-CATH;  Surgeon: Michael Boston, MD;  Location: [facility];  Service: General;  Laterality: N/A;   PORTACATH PLACEMENT N/A 05/25/2015   Procedure: INSERTION PORT-A-CATH;  Surgeon: Michael Boston, MD;  [facility];  Service: General;  Laterality: N/A;-remains inplace Right chest.   ROTATOR CUFF REPAIR Right 2006   TONSILLECTOMY  age 57   VENTRAL HERNIA REPAIR N/A 07/23/2018   Procedure: [place];  Surgeon:  Michael Boston, MD;  [facility];  Service: General;  Laterality: N/A;   XI ROBOTIC ASSISTED LOWER ANTERIOR RESECTION N/A 05/25/2015   Procedure: XI ROBOTIC ASSISTED LOWER ANTERIOR RESECTION, , RIGID PROCTOSCOPY, RIGHT OOPHORECTOMY;  Surgeon: Michael Boston, MD;  [facility];  Service: General;  Laterality: N/A;  :   Current Facility-Administered Medications  Medication Dose Route Frequency Provider Last Rate Last Admin   0.9 %  sodium chloride infusion  250 mL Intravenous PRN Michael Boston, MD       alum & mag hydroxide-simeth (MAALOX/MYLANTA) 200-200-20 MG/5ML suspension 30 mL  30 mL Oral Q6H PRN Michael Boston, MD       bisoprolol (ZEBETA) tablet 10 mg  10 mg Oral Daily Florencia Reasons, MD   10 mg at 02/18/22 1002   diphenhydrAMINE (BENADRYL) injection 12.5-25 mg  12.5-25 mg Intravenous Q6H PRN Michael Boston, MD       DULoxetine  (CYMBALTA) DR capsule 120 mg  120 mg Oral Daily Michael Boston, MD   120 mg at 02/18/22 1002   enoxaparin (LOVENOX) injection 70 mg  70 mg Subcutaneous Q24H Florencia Reasons, MD   70 mg at 02/17/22 2119   famotidine (PEPCID) tablet 20 mg  20 mg Oral BID Florencia Reasons, MD   20 mg at 02/18/22 1002   hydrALAZINE (APRESOLINE) injection 10 mg  10 mg Intravenous Q8H PRN Marylyn Ishihara, Tyrone A, DO       HYDROmorphone (DILAUDID) injection 0.5 mg  0.5 mg Intravenous Q4H PRN Florencia Reasons, MD   0.5 mg at 02/16/22 1924   levETIRAcetam (KEPPRA) tablet 250 mg  250 mg Oral BID Florencia Reasons, MD   250 mg at 02/18/22 1002   levothyroxine (SYNTHROID) tablet 50 mcg  50 mcg Oral q morning Michael Boston, MD   50 mcg at 02/18/22 0501   lip balm (CARMEX) ointment   Topical BID Michael Boston, MD   Given at 02/17/22 2143   magic mouthwash  15 mL Oral QID PRN Michael Boston, MD       melatonin tablet 3-6 mg  3-6 mg Oral QHS PRN Florencia Reasons, MD       melatonin tablet 6 mg  6 mg Oral QHS Sharion Settler, NP   6 mg at 02/17/22 2117   menthol-cetylpyridinium (CEPACOL) lozenge 3 mg  1 lozenge Oral PRN Michael Boston, MD       methocarbamol (ROBAXIN) 1,000 mg in dextrose 5 % 100 mL IVPB  1,000 mg Intravenous Q6H PRN Michael Boston, MD       methocarbamol (ROBAXIN) tablet 1,000 mg  1,000 mg Oral Q6H PRN Michael Boston, MD       morphine (PF) 2 MG/ML injection 2 mg  2 mg Intravenous Q4H PRN Marylyn Ishihara, Tyrone A, DO   2 mg at 02/18/22 0501   ondansetron (ZOFRAN) tablet 4 mg  4 mg Oral TID AC & HS Michael Boston, MD   4 mg at 02/18/22 0811   phenol (CHLORASEPTIC) mouth spray 2 spray  2 spray Mouth/Throat PRN Michael Boston, MD       polyethylene glycol (MIRALAX / GLYCOLAX) packet 17 g  17 g Oral BID Michael Boston, MD   17 g at 02/18/22 1001   pregabalin (LYRICA) capsule 225 mg  225 mg Oral BID Michael Boston, MD   225 mg at 02/18/22 1002   prochlorperazine (COMPAZINE) injection 5-10 mg  5-10 mg Intravenous Q4H PRN Michael Boston, MD   5 mg at 02/16/22 1945   rosuvastatin  (CRESTOR)  tablet 5 mg  5 mg Oral QHS Florencia Reasons, MD   5 mg at 02/17/22 2118   sodium chloride flush (NS) 0.9 % injection 3 mL  3 mL Intravenous Gorden Harms, MD   3 mL at 02/17/22 2126   sodium chloride flush (NS) 0.9 % injection 3 mL  3 mL Intravenous PRN Michael Boston, MD       Facility-Administered Medications Ordered in Other Encounters  Medication Dose Route Frequency Provider Last Rate Last Admin   0.9 %  sodium chloride infusion   Intravenous Once Truitt Merle, MD          Allergies  Allergen Reactions   Caine-1 [Lidocaine] Swelling and Rash    Eyes swell shut; includes all caine drugs except marcaine. EMLA cream OK though (?!)   Bupropion Other (See Comments)    Not effective per Pt.    Sulfa Antibiotics Nausea And Vomiting and Rash   Pantoprazole Other (See Comments)    Not effective per Pt.    Adhesive [Tape] Rash and Other (See Comments)    Blisters - can use paper tape   Doxycycline Nausea Only   Flagyl [Metronidazole] Nausea Only   Iron Nausea Only   Metoprolol Nausea Only and Palpitations   Oxycodone Other (See Comments)    NIGHTMARES. (tolerates hydrocodone or tramadol better)   Penicillins Nausea Only and Rash    Has patient had a PCN reaction causing immediate rash, facial/tongue/throat swelling, SOB or lightheadedness with hypotension: no Has patient had a PCN reaction causing severe rash involving mucus membranes or skin necrosis: no Has patient had a PCN reaction that required hospitalization no Has patient had a PCN reaction occurring within the last 10 years: no If all of the above answers are "NO", then may proceed with Cephalosporin use.   :   Family History  Problem Relation Age of Onset   Coronary artery disease Mother 13   Hypertension Mother    Hyperlipidemia Mother    Diabetes Mellitus I Mother    Coronary artery disease Father    Hyperlipidemia Father    Hypertension Father    Cancer Sister        skin - non melanoma   Hyperlipidemia  Brother    Cancer Maternal Uncle 20       pancreatic with mets to colon and prostate   Cirrhosis Maternal Uncle    Hypertension Maternal Grandmother    Diabetes Mellitus I Maternal Grandmother    Hyperlipidemia Maternal Grandmother    CVA Maternal Grandmother    Hypertension Maternal Grandfather    Coronary artery disease Maternal Grandfather 71   Hyperlipidemia Maternal Grandfather    Coronary artery disease Paternal Grandmother    Hypertension Paternal Grandmother    Hyperlipidemia Paternal Grandmother    Diabetes Mellitus I Paternal Grandmother    Hypertension Paternal Grandfather    Hyperlipidemia Paternal Grandfather    Coronary artery disease Paternal Grandfather   :   Social History   Socioeconomic History   Marital status: Married    Spouse name: Not on file   Number of children: Not on file   Years of education: Not on file   Highest education level: Not on file  Occupational History   Not on file  Tobacco Use   Smoking status: Former    Packs/day: 0.25    Years: 7.00    Total pack years: 1.75    Types: Cigarettes    Quit date: 07/16/1991    Years since  quitting: 30.6   Smokeless tobacco: Never  Vaping Use   Vaping Use: Never used  Substance and Sexual Activity   Alcohol use: No   Drug use: Never   Sexual activity: Yes    Birth control/protection: Surgical  Other Topics Concern   Not on file  Social History Narrative   Tobacco Use: Cigarettes - Former Smoker   Alcohol: Yes, very rare, liquor.    No recreational drug use   Occupation: Head CMA @ [employer]   Marital Status: Married    Husband: Roselyn Reef Disabled   Children: 2 adopted kids [place]   Religion: [employer] [place]               Social Determinants of Health   Financial Resource Strain: Not on file  Food Insecurity: Not on file  Transportation Needs: Not on file  Physical Activity: Not on file  Stress: Not on file  Social Connections: Not on file  Intimate  Partner Violence: Not on file  :  Review of Systems: A comprehensive 14 point review of systems was negative except as noted in the HPI.  Exam: Patient Vitals for the past 24 hrs:  BP Temp Temp src Pulse Resp SpO2  02/18/22 0556 (!) 147/74 97.8 F (36.6 C) Oral (!) 51 18 95 %  02/17/22 2137 122/77 98.1 F (36.7 C) Oral (!) 55 18 95 %  02/17/22 1329 (!) 144/60 97.7 F (36.5 C) Oral (!) 44 15 98 %    General:  well-nourished in no acute distress.   Eyes:  no scleral icterus.   ENT:  There were no oropharyngeal lesions.   Lymphatics:  Negative cervical, supraclavicular or axillary adenopathy.   Respiratory: lungs were clear bilaterally without wheezing or crackles.   Cardiovascular:  Regular rate and rhythm, S1/S2, without murmur, rub or gallop.  There was no pedal edema.   GI: Soft, nontender, colostomy bag with light brown stool.   Skin exam was without echymosis, petichae.   Neuro exam was nonfocal. Patient was alert and oriented.  Attention was good.   Language was appropriate.  Mood was normal without depression.  Speech was not pressured.  Thought content was not tangential.     Lab Results  Component Value Date   WBC 7.7 02/17/2022   HGB 11.1 (L) 02/17/2022   HCT 35.8 (L) 02/17/2022   PLT 260 02/17/2022   GLUCOSE 95 02/18/2022   ALT 13 02/16/2022   AST 14 (L) 02/16/2022   NA 139 02/18/2022   K 3.7 02/18/2022   CL 108 02/18/2022   CREATININE 0.94 02/18/2022   BUN 7 02/18/2022   CO2 24 02/18/2022    CT ABDOMEN PELVIS W CONTRAST  Result Date: 02/15/2022 CLINICAL DATA:  Abdominal pain since Wednesday. History of Crohn disease. Prior abdominal perineal resection for rectal cancer. Suspicion of bowel obstruction. * Tracking Code: BO * EXAM: CT ABDOMEN AND PELVIS WITH CONTRAST TECHNIQUE: Multidetector CT imaging of the abdomen and pelvis was performed using the standard protocol following bolus administration of intravenous contrast. RADIATION DOSE REDUCTION: This exam was  performed according to the departmental dose-optimization program which includes automated exposure control, adjustment of the mA and/or kV according to patient size and/or use of iterative reconstruction technique. CONTRAST:  174m OMNIPAQUE IOHEXOL 300 MG/ML  SOLN COMPARISON:  10/12/2020 FINDINGS: Lower chest: Clear lung bases. Normal heart size without pericardial or pleural effusion. Hepatobiliary: Focal steatosis adjacent the falciform ligament. Normal gallbladder, without biliary ductal dilatation. Pancreas:  Normal, without mass or ductal dilatation. Spleen: Normal in size, without focal abnormality. Adrenals/Urinary Tract: Normal adrenal glands. Development of moderate bilateral hydroureteronephrosis. Followed to the level of the mid pelvis. The bladder is mildly thick walled. Stomach/Bowel: Normal stomach, without wall thickening. Descending colostomy consistent with prior abdominal perineal resection. Small bowel loops are dilated and fluid-filled including up to 4.0 cm on 55/2. Suspect a pelvic transition including on 69 through 61 of series 2. No obstructive mass identified. No complicating ischemia. Vascular/Lymphatic: Aortic atherosclerosis. Right common iliac node measures 9 mm on 67/2 and is new. Left common iliac node measures 9 mm on 62/2 and is significantly enlarged since the prior. Reproductive: Prior hysterectomy per report. Left adnexal cystic lesions of up to 2.5 cm including on 79/2, slightly increased compared to 10/12/2020. Other: Presacral soft tissue and fluid density including on 83/2. trace gas within on 85/2. Especially when comparing sagittal image 80 to the prior exam, suspect increased caudal extension. Foci of gas also identified within more cephalad extension of presacral soft tissue thickening, including on 76/2, new. No free intraperitoneal air. No evidence of omental or peritoneal disease. Musculoskeletal: L4-5 trans pedicle screw fixation. Advanced degenerate disc disease at  this level as well as L3-4. IMPRESSION: 1. Partial small bowel obstruction with transition point in the pelvis, most likely due to adhesions. 2. Development of bilateral hydroureteronephrosis, followed to the level of the pelvis. Possibly related to scarring in this patient who is status post abdominal perineal resection for presumed rectal carcinoma. However, given below findings, residual or recurrent disease is possible. 3. Subtly progressive presacral soft tissue thickening with areas of small volume gas within. Although findings could represent fistulous communication to bowel and resultant inflammation, local recurrence cannot be excluded. Consider further evaluation with PET. 4. Enlargement of small pelvic nodes, reactive or indicative of early metastasis. 5. Enlargement of left pelvic/adnexal cystic lesions, possibly residual ovarian follicles or cysts versus postoperative entities such as peritoneal inclusion cysts. 6. Mildly thick walled bladder, possibly representing cystitis. 7.  Aortic Atherosclerosis (ICD10-I70.0). Electronically Signed   By: Abigail Miyamoto M.D.   On: 02/15/2022 05:26   DG Chest Port 1 View  Result Date: 02/15/2022 CLINICAL DATA:  Abdominal pain. EXAM: PORTABLE CHEST 1 VIEW COMPARISON:  02/03/2018. FINDINGS: The heart size and mediastinal contours are within normal limits. There is atherosclerotic calcification of the aorta. No consolidation, effusion, or pneumothorax. No acute osseous abnormality. IMPRESSION: No active disease. Electronically Signed   By: Brett Fairy M.D.   On: 02/15/2022 03:49     CT ABDOMEN PELVIS W CONTRAST  Result Date: 02/15/2022 CLINICAL DATA:  Abdominal pain since Wednesday. History of Crohn disease. Prior abdominal perineal resection for rectal cancer. Suspicion of bowel obstruction. * Tracking Code: BO * EXAM: CT ABDOMEN AND PELVIS WITH CONTRAST TECHNIQUE: Multidetector CT imaging of the abdomen and pelvis was performed using the standard protocol  following bolus administration of intravenous contrast. RADIATION DOSE REDUCTION: This exam was performed according to the departmental dose-optimization program which includes automated exposure control, adjustment of the mA and/or kV according to patient size and/or use of iterative reconstruction technique. CONTRAST:  136m OMNIPAQUE IOHEXOL 300 MG/ML  SOLN COMPARISON:  10/12/2020 FINDINGS: Lower chest: Clear lung bases. Normal heart size without pericardial or pleural effusion. Hepatobiliary: Focal steatosis adjacent the falciform ligament. Normal gallbladder, without biliary ductal dilatation. Pancreas: Normal, without mass or ductal dilatation. Spleen: Normal in size, without focal abnormality. Adrenals/Urinary Tract: Normal adrenal glands. Development of moderate bilateral hydroureteronephrosis.  Followed to the level of the mid pelvis. The bladder is mildly thick walled. Stomach/Bowel: Normal stomach, without wall thickening. Descending colostomy consistent with prior abdominal perineal resection. Small bowel loops are dilated and fluid-filled including up to 4.0 cm on 55/2. Suspect a pelvic transition including on 69 through 61 of series 2. No obstructive mass identified. No complicating ischemia. Vascular/Lymphatic: Aortic atherosclerosis. Right common iliac node measures 9 mm on 67/2 and is new. Left common iliac node measures 9 mm on 62/2 and is significantly enlarged since the prior. Reproductive: Prior hysterectomy per report. Left adnexal cystic lesions of up to 2.5 cm including on 79/2, slightly increased compared to 10/12/2020. Other: Presacral soft tissue and fluid density including on 83/2. trace gas within on 85/2. Especially when comparing sagittal image 80 to the prior exam, suspect increased caudal extension. Foci of gas also identified within more cephalad extension of presacral soft tissue thickening, including on 76/2, new. No free intraperitoneal air. No evidence of omental or peritoneal  disease. Musculoskeletal: L4-5 trans pedicle screw fixation. Advanced degenerate disc disease at this level as well as L3-4. IMPRESSION: 1. Partial small bowel obstruction with transition point in the pelvis, most likely due to adhesions. 2. Development of bilateral hydroureteronephrosis, followed to the level of the pelvis. Possibly related to scarring in this patient who is status post abdominal perineal resection for presumed rectal carcinoma. However, given below findings, residual or recurrent disease is possible. 3. Subtly progressive presacral soft tissue thickening with areas of small volume gas within. Although findings could represent fistulous communication to bowel and resultant inflammation, local recurrence cannot be excluded. Consider further evaluation with PET. 4. Enlargement of small pelvic nodes, reactive or indicative of early metastasis. 5. Enlargement of left pelvic/adnexal cystic lesions, possibly residual ovarian follicles or cysts versus postoperative entities such as peritoneal inclusion cysts. 6. Mildly thick walled bladder, possibly representing cystitis. 7.  Aortic Atherosclerosis (ICD10-I70.0). Electronically Signed   By: Abigail Miyamoto M.D.   On: 02/15/2022 05:26   DG Chest Port 1 View  Result Date: 02/15/2022 CLINICAL DATA:  Abdominal pain. EXAM: PORTABLE CHEST 1 VIEW COMPARISON:  02/03/2018. FINDINGS: The heart size and mediastinal contours are within normal limits. There is atherosclerotic calcification of the aorta. No consolidation, effusion, or pneumothorax. No acute osseous abnormality. IMPRESSION: No active disease. Electronically Signed   By: Brett Fairy M.D.   On: 02/15/2022 03:49    Assessment and Plan:  Rectal adenocarcinoma T3N2M0, Stage IIIC , ypT1N0 after neoadjuvant chemotherapy and radiation Partial small bowel obstruction, resolved Presacral soft tissue thickening and enlargement of small pelvic nodes Bilateral  hydroureteronephrosis Hypertension Hyperlipidemia Hypothyroidism  -The patient completed treatment for rectal cancer and had no evidence of disease recurrence for over 5 years.  She was discharged from our practice at that time. -CT scan findings show subtle changes which could be inflammatory versus possible local recurrence. -Explained to the patient that it is difficult to definitively say if imaging findings are consistent with disease recurrence. -Recommend obtaining a CEA and PET scan as outpatient. -Once we have these results, we can consider proceeding with a biopsy if necessary. -The patient is being followed by urology at [facility].  She already has outpatient follow-up with them to address the bilateral hydroureteronephrosis.  Renal function remains normal. -We will arrange for outpatient follow-up at the cancer center following hospital discharge.  Thank you for this referral.   Mikey Bussing, DNP, AGPCNP-BC, AOCNP   Addendum I have seen the patient, examined  her. I agree with the assessment and and plan and have edited the notes.  Patient is well-known to me, was previously under my care for her rectal cancer, status post neoadjuvant concurrent chemotherapy and radiation and surgical resection.  Unfortunately she had pelvic abscess after surgery, and colovaginal fistula, which resulted in significant adhesions and scar tissue in pelvis.  She was admitted for small bowel obstruction, which has spontaneously resolved.  I reviewed CT scan from February 15, 2022, which showed bilateral hydroureteronephrosis, and slightly progressive presacral soft tissue thickening (comparing to CT scan from March 2022), and a 9 mm pelvic node.  I think the above change are likely related to her scar tissue from previous surgery and infection, I do not have strong suspicion for cancer recurrence, especially it has been 7 years since her initial diagnosis.  However I do think it is reasonable to get  a PET scan after discharge, for further evaluation.  Patient is agreeable she will follow-up with her GYN urologist Dr. Rodman Key at [facility] soon for her hydronephrosis.  I plan to see her back  in my office after PET scan.   Truitt Merle  02/18/2022

## 2022-02-18 NOTE — Progress Notes (Signed)
Discharge instructions discussed with patient and family, verbalized agreement and understanding 

## 2022-02-18 NOTE — Discharge Summary (Signed)
Physician Discharge Summary  [name] MRN: [id number] DOB/AGE: [birth year] 56 y.o.  Admit date: 02/15/2022 Discharge date: 02/18/2022  Admission Diagnoses: Small bowel obstruction  Discharge Diagnoses:  Principal Problem:   Partial small bowel obstruction ([place]) Active Problems:   Pure hypercholesterolemia   Obesity, Class III, BMI 40-49.9 (morbid obesity) ([place])   Rectal adenocarcinoma s/p LAR resection 05/25/2015   Hypertension   Depression   Hypokalemia   Colostomy in place [facility])   Chronic pain syndrome   Discitis, unspecified, lumbar region   SBO (small bowel obstruction) ([facility])   Hypothyroidism   GERD (gastroesophageal reflux disease)   Hyperglycemia   Lactic acidosis   Urinary incontinence, mixed   Bilateral hydronephrosis   Discharged Condition: Good  Hospital Course: She was admitted with a recurrent partial small bowel obstruction.  This resolved relatively quickly.  On hospital day 2 she was having bowel function, tolerating p.o. without nausea or bloating and denied abdominal pain.   Discharge Exam: Blood pressure (!) 147/74, pulse (!) 51, temperature 97.8 F (36.6 C), temperature source Oral, resp. rate 18, height 5' 10.5" (1.791 m), weight (!) 140 kg, SpO2 95 %. Alert and well-appearing Unlabored respirations Abdomen is soft, nontender, nondistended, ostomy productive  Disposition: Discharge disposition: 01-Home or Self Care        Allergies as of 02/18/2022       Reactions   Caine-1 [lidocaine] Swelling, Rash   Eyes swell shut; includes all caine drugs except marcaine. EMLA cream OK though (?!)   Bupropion Other (See Comments)   Not effective per Pt.    Sulfa Antibiotics Nausea And Vomiting, Rash   Pantoprazole Other (See Comments)   Not effective per Pt.    Adhesive [tape] Rash, Other (See Comments)   Blisters - can use paper tape   Doxycycline Nausea Only   Flagyl [metronidazole] Nausea Only   Iron Nausea Only   Metoprolol  Nausea Only, Palpitations   Oxycodone Other (See Comments)   NIGHTMARES. (tolerates hydrocodone or tramadol better)   Penicillins Nausea Only, Rash   Has patient had a PCN reaction causing immediate rash, facial/tongue/throat swelling, SOB or lightheadedness with hypotension: no Has patient had a PCN reaction causing severe rash involving mucus membranes or skin necrosis: no Has patient had a PCN reaction that required hospitalization no Has patient had a PCN reaction occurring within the last 10 years: no If all of the above answers are "NO", then may proceed with Cephalosporin use.        Medication List     TAKE these medications    acetaminophen 500 MG tablet Commonly known as: TYLENOL Take 1,000 mg by mouth as needed (pain).   bisoprolol 10 MG tablet Commonly known as: ZEBETA Take 10 mg by mouth daily.   celecoxib 100 MG capsule Commonly known as: CELEBREX Take 100 mg by mouth 2 (two) times daily.   CoQ10 200 MG Caps Take 200 mg by mouth at bedtime.   DULoxetine 60 MG capsule Commonly known as: CYMBALTA Take 120 mg by mouth daily.   esomeprazole 40 MG capsule Commonly known as: NEXIUM Take 1 capsule (40 mg total) by mouth 2 (two) times daily before a meal.   furosemide 20 MG tablet Commonly known as: LASIX Take 20 mg by mouth daily as needed for edema.   gabapentin 800 MG tablet Commonly known as: NEURONTIN Take 800 mg by mouth 3 (three) times daily.   HYDROcodone-acetaminophen 10-325 MG tablet Commonly known as: NORCO Take 1  tablet by mouth every 8 (eight) hours as needed for severe pain.   HYDROmorphone 4 MG tablet Commonly known as: DILAUDID Take 4 mg by mouth every 6 (six) hours as needed (breakthrough pain).   levETIRAcetam 250 MG tablet Commonly known as: KEPPRA Take 250 mg by mouth 2 (two) times daily.   levothyroxine 50 MCG tablet Commonly known as: SYNTHROID Take 50 mcg by mouth every morning.   melatonin 3 MG Tabs tablet Take 3-6 mg by  mouth at bedtime as needed (sleep).   morphine 15 MG 12 hr tablet Commonly known as: MS CONTIN Take 15 mg by mouth 2 (two) times daily.   multivitamin with minerals Tabs tablet Take 2 tablets by mouth at bedtime.   oxybutynin 10 MG 24 hr tablet Commonly known as: DITROPAN-XL Take 10 mg by mouth daily.   polyethylene glycol 17 g packet Commonly known as: MiraLax Take 17 g by mouth daily. What changed:  when to take this reasons to take this   pregabalin 225 MG capsule Commonly known as: LYRICA Take 225 mg by mouth 2 (two) times daily.   promethazine 25 MG tablet Commonly known as: PHENERGAN TAKE ONE TABLET BY MOUTH EVERY 6 HOURS AS NEEDED FOR NAUSEA OR VOMITING What changed: See the new instructions.   rosuvastatin 5 MG tablet Commonly known as: CRESTOR Take 5 mg by mouth at bedtime.   triamcinolone lotion 0.1 % Commonly known as: KENALOG Apply 1 application topically as needed (dry skin).   Vitamin D3 125 MCG (5000 UT) Caps Take 5,000 Units by mouth at bedtime.         Signed: Clovis Riley 02/18/2022, 9:22 AM

## 2022-02-19 ENCOUNTER — Other Ambulatory Visit: Payer: Self-pay

## 2022-02-20 DIAGNOSIS — R935 Abnormal findings on diagnostic imaging of other abdominal regions, including retroperitoneum: Secondary | ICD-10-CM | POA: Diagnosis not present

## 2022-02-20 DIAGNOSIS — Z85048 Personal history of other malignant neoplasm of rectum, rectosigmoid junction, and anus: Secondary | ICD-10-CM | POA: Diagnosis not present

## 2022-02-20 DIAGNOSIS — Z932 Ileostomy status: Secondary | ICD-10-CM | POA: Diagnosis not present

## 2022-02-20 DIAGNOSIS — N1330 Unspecified hydronephrosis: Secondary | ICD-10-CM | POA: Diagnosis not present

## 2022-02-20 DIAGNOSIS — K56609 Unspecified intestinal obstruction, unspecified as to partial versus complete obstruction: Secondary | ICD-10-CM | POA: Diagnosis not present

## 2022-02-20 DIAGNOSIS — G894 Chronic pain syndrome: Secondary | ICD-10-CM | POA: Diagnosis not present

## 2022-02-20 LAB — CULTURE, BLOOD (ROUTINE X 2)
Culture: NO GROWTH
Culture: NO GROWTH
Special Requests: ADEQUATE

## 2022-02-21 LAB — CEA: CEA: 1.9 ng/mL (ref 0.0–4.7)

## 2022-02-28 DIAGNOSIS — T451X5A Adverse effect of antineoplastic and immunosuppressive drugs, initial encounter: Secondary | ICD-10-CM | POA: Diagnosis not present

## 2022-02-28 DIAGNOSIS — M4726 Other spondylosis with radiculopathy, lumbar region: Secondary | ICD-10-CM | POA: Diagnosis not present

## 2022-02-28 DIAGNOSIS — Z85048 Personal history of other malignant neoplasm of rectum, rectosigmoid junction, and anus: Secondary | ICD-10-CM | POA: Diagnosis not present

## 2022-02-28 DIAGNOSIS — M48061 Spinal stenosis, lumbar region without neurogenic claudication: Secondary | ICD-10-CM | POA: Diagnosis not present

## 2022-02-28 DIAGNOSIS — G620 Drug-induced polyneuropathy: Secondary | ICD-10-CM | POA: Diagnosis not present

## 2022-02-28 DIAGNOSIS — M5136 Other intervertebral disc degeneration, lumbar region: Secondary | ICD-10-CM | POA: Diagnosis not present

## 2022-02-28 DIAGNOSIS — C20 Malignant neoplasm of rectum: Secondary | ICD-10-CM | POA: Diagnosis not present

## 2022-02-28 DIAGNOSIS — G894 Chronic pain syndrome: Secondary | ICD-10-CM | POA: Diagnosis not present

## 2022-02-28 DIAGNOSIS — M47816 Spondylosis without myelopathy or radiculopathy, lumbar region: Secondary | ICD-10-CM | POA: Diagnosis not present

## 2022-03-04 ENCOUNTER — Encounter (HOSPITAL_COMMUNITY): Admission: RE | Admit: 2022-03-04 | Payer: Medicare HMO | Source: Ambulatory Visit

## 2022-03-04 ENCOUNTER — Encounter (HOSPITAL_COMMUNITY): Payer: Self-pay

## 2022-03-06 ENCOUNTER — Ambulatory Visit (HOSPITAL_COMMUNITY)
Admission: RE | Admit: 2022-03-06 | Discharge: 2022-03-06 | Disposition: A | Discharge status: Home / Self Care | Payer: Medicare HMO | Source: Ambulatory Visit | Attending: Hematology | Admitting: Hematology

## 2022-03-06 ENCOUNTER — Ambulatory Visit: Payer: Medicare HMO | Admitting: Hematology

## 2022-03-06 DIAGNOSIS — C20 Malignant neoplasm of rectum: Secondary | ICD-10-CM | POA: Diagnosis not present

## 2022-03-06 LAB — GLUCOSE, CAPILLARY: Glucose-Capillary: 114 mg/dL — ABNORMAL HIGH (ref 70–99)

## 2022-03-06 MED ORDER — FLUDEOXYGLUCOSE F - 18 (FDG) INJECTION
16.1200 | Freq: Once | INTRAVENOUS | Status: AC | PRN
  Administered 2022-03-06: 16.12 via INTRAVENOUS

## 2022-03-11 DIAGNOSIS — C20 Malignant neoplasm of rectum: Secondary | ICD-10-CM | POA: Diagnosis not present

## 2022-03-11 DIAGNOSIS — Z933 Colostomy status: Secondary | ICD-10-CM | POA: Diagnosis not present

## 2022-03-11 DIAGNOSIS — G894 Chronic pain syndrome: Secondary | ICD-10-CM | POA: Diagnosis not present

## 2022-03-11 DIAGNOSIS — R32 Unspecified urinary incontinence: Secondary | ICD-10-CM | POA: Diagnosis not present

## 2022-03-11 DIAGNOSIS — N1330 Unspecified hydronephrosis: Secondary | ICD-10-CM | POA: Diagnosis not present

## 2022-03-13 ENCOUNTER — Other Ambulatory Visit: Payer: Self-pay

## 2022-03-13 ENCOUNTER — Encounter: Payer: Self-pay | Admitting: Hematology

## 2022-03-13 ENCOUNTER — Inpatient Hospital Stay: Payer: Medicare HMO | Attending: Hematology | Admitting: Hematology

## 2022-03-13 VITALS — BP 151/93 | HR 113 | Temp 99.1°F | Resp 18 | Ht 70.5 in | Wt 310.3 lb

## 2022-03-13 DIAGNOSIS — Z85048 Personal history of other malignant neoplasm of rectum, rectosigmoid junction, and anus: Secondary | ICD-10-CM | POA: Diagnosis not present

## 2022-03-13 DIAGNOSIS — C20 Malignant neoplasm of rectum: Secondary | ICD-10-CM | POA: Diagnosis not present

## 2022-03-13 DIAGNOSIS — Z933 Colostomy status: Secondary | ICD-10-CM | POA: Insufficient documentation

## 2022-03-13 DIAGNOSIS — Z9221 Personal history of antineoplastic chemotherapy: Secondary | ICD-10-CM | POA: Insufficient documentation

## 2022-03-13 DIAGNOSIS — Z923 Personal history of irradiation: Secondary | ICD-10-CM | POA: Insufficient documentation

## 2022-03-13 NOTE — Progress Notes (Unsigned)
Greggory Keen, MD  Tamala Bari for CT bx presacral PET + Soft tissue mass   See last PET and CT   TS

## 2022-03-13 NOTE — Progress Notes (Signed)
[facility]   [phone] [phone]   Clinic Follow up Note   Patient Care Team: [name], MD as PCP - General (Family Medicine) Jerline Pain, MD as PCP - Cardiology (Cardiology) Michael Boston, MD as Consulting Physician (General Surgery) Truitt Merle, MD as Consulting Physician (Medical Oncology) Kyung Rudd, MD as Consulting Physician (Radiation Oncology) Teena Irani, MD (Inactive) as Consulting Physician (Gastroenterology) Raynelle Bring, MD as Consulting Physician (Urology) Eustace Moore, MD as Consulting Physician (Neurosurgery)  Date of Service:  03/13/2022  CHIEF COMPLAINT: f/u of rectal cancer  CURRENT THERAPY:  Pending  ASSESSMENT & PLAN:  Lindsay Little is a 56 y.o. female with   1. Rectal adenocarcinoma T3N2M0, Stage IIIC , ypT1N0 after neoadjuvant chemotherapy and radiation -Diagnosed in 01/2015. Treated with surgery with neoadjuvant chemo and radiation. She has permanent colostomy bag in place. She completed 5 years surveillance. -she presented to ED on 02/15/22 with abdominal pain and vomiting. CT AP showed: partial SBO; development of b/l hydroureteronephrosis; subtly progressive presacral soft tissue thickening with areas of small volume gas; enlargement of small pelvic nodes and left pelvic/adnexal cystic lesions. -PET scan on 03/06/22 showed: increased metabolic activity and associated hypermetabolic lymph nodes in left pelvis; no signs of solid organ or distant metastatic disease; heterogeneous but diffuse marrow uptake.  -I pleasantly reviewed the PET scan images, and I discussed the results with them today. I plan to refer her to IR for CT guided biopsy of the presacral soft tissue rule out or confirm malignancy.  If this confirms cancer recurrence, we will discuss treatment options.  We discussed that this is unusual later recurrence from her previous rectal cancer, new malignancy or benign change are also possible.   2.  B/l Hydroureteronephrosis  -seen on CT 02/15/22. She is scheduled for cystoscopy on 03/15/22 with Dr. Amalia Hailey and urethral bulking on 04/29/22 with Dr. Zigmund Daniel.   3. Pelvic cramping, Vaginal bleeding -she is s/p hysterectomy -she has h/o rectovaginal fistula, previously with persistent rectal and vaginal discharge -she reports deep pelvis cramping and vaginal bleeding. She tells me her other physicians have not found a definite reason for this.     Plan  -PET scan reviewed  -referral for CT biopsy to be done soon -phone visit 2-3 days after biopsy   No problem-specific Assessment & Plan notes found for this encounter.   SUMMARY OF ONCOLOGIC HISTORY: Oncology History Overview Note  Rectal adenocarcinoma   Staging form: Colon and Rectum, AJCC 7th Edition     Clinical: T3, N2, M0 - Unsigned     Rectal adenocarcinoma s/p LAR resection 05/25/2015  01/13/2015 Initial Diagnosis   Rectal adenocarcinoma   01/13/2015 Procedure   Colonoscopy showed a sensitivity O nonobstructing mass in the rectum and from 12-18 cm proximal to the Annis. The mass was circumferential, measuring about 6 cm in length. EGD was negative.   01/13/2015 Cancer Staging   Staging form: Colon and Rectum, AJCC 7th Edition - Clinical stage from 01/13/2015: T3, N2, M0 - Signed by Truitt Merle, MD on 12/15/2017   01/17/2015 Tumor Marker   CEA 1.4, CA-19-9 8, MMR normal    01/18/2015 Procedure   Lower endoscopic ultrasound by Dr. Paulita Fujita showed a T3 N2 rectal mass.   01/20/2015 Imaging   CT abdomen and pelvis with contrast showed right lateral rectal wall exophytic mass, and 2 ill-defined hepatic hypoenhancing lesions which appears to correspond to the lesion seen on the prior MRI in 2015.  02/05/2015 Imaging   abdomen MRI showed 2 hemangioma, no suspicion for metastatic disease. CT chest was negative   02/20/2015 - 03/30/2015 Radiation Therapy   neoadjuvant RT to rectal cancer    02/20/2015 Concurrent Chemotherapy   capecitabine 2500  mg in the morning and 2000 mg in the evening (823m/m2, bid), on the day of radiation.    05/25/2015 Surgery   Recto-sigmoid segmental resection, margins are negative    05/25/2015 Pathology Results   0.2cm residual invasive adenocarcinoma, G2, negative margins, 12 nodes were negative    05/25/2015 Cancer Staging   Staging form: Colon and Rectum, AJCC 7th Edition - Pathologic stage from 05/25/2015: Stage I (T1, N0, cM0) - Signed by FTruitt Merle MD on 12/15/2017   06/23/2015 - 06/29/2015 Hospital Admission   Patient was admitted for pelvic abscess, drain placed and she was treated with IV antibiotics, she also received 1 dose of Feraheme for anemia.   09/15/2015 - 09/17/2015 Hospital Admission   Recurrent pelvic collection s/p perc drainage 09/16/2015   02/21/2016 Imaging   CT abdomen and pelvis w contrast IMPRESSION: 1. Interval removal of surgical drains since 12/19/2015 from the presacral space. Similar size of ill-defined presacral fluid and gas, for which residual abscess cannot be excluded. Similar amount of intraperitoneal edema throughout the upper pelvis with foci of extraluminal gas, possibly related to the presacral process. 2. Diverting right-sided ileostomy, without acute complication. 3. Similar moderate hydroureteronephrosis, likely due to the pelvic process. 4. Possible bladder wall thickening. Correlate with urinalysis. This appearance could be partially due to underdistention. 5. Geographic hepatic steatosis and hemangiomas. Cannot exclude a new right hepatic lobe lesion. Consider nonemergent pre and post contrast outpatient MRI. This could represent a new area of focal steatosis.   05/31/2016 Surgery   ileostomy takedown BY Dr. GJohney Maine   07/04/2016 Imaging   CT Abdomen Pelvis W Contrast 07/04/16 IMPRESSION: Subcutaneous fluid collection at the site of ileostomy takedown, could be sterile or infected, measuring 9.3 x 4.3 x 5.3 cm; this could be aspirated under ultrasound  guidance if clinically indicated. Large collection of stool within the presacral space, could potentially be within a distended distorted rectum but since this occupies the same position as the abscess collection identified on the previous CT this is suspicious for a contiguous contained extraluminal stool collection communicating with the rectosigmoid colon, collection overall roughly 7 x 8 x 9 cm.   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   12/26/2016 Imaging   IMPRESSION: 1. Stable CT exam of the chest.  No evidence for metastatic disease. 2. Interval left colectomy with left abdominal end colostomy. Presacral soft tissue is similar to prior study when taking into account the interval surgery. 3. Hepatic dome lesion seen on the previous study not evident today, potentially related to bolus timing. The 2.4 cm subcapsular inferior right liver lesion remains subtle and is barely visible on today's exam but shows no substantial interval change in size. Continued attention on follow-up recommended.     12/09/2017 Imaging   CT CAP W Contrast 12/09/17  IMPRESSION: 1. Stable post treatment changes in the presacral space with no evidence of recurrent disease in the pelvis. 2. No evidence of metastatic disease in the chest, abdomen or pelvis. 3. Large parastomal hernia has increased in size and contains much of the remnant left colon. No evidence of bowel obstruction or ischemia. 4. Stable dilated main pulmonary artery, suggesting chronic  pulmonary arterial hypertension. 5.  Aortic Atherosclerosis (ICD10-I70.0).   12/31/2017 Genetic Testing   The Multi-Cancer Panel offered by [facility] includes sequencing and/or deletion duplication testing of the following 83 genes: ALK, APC, ATM, AXIN2,BAP1,  BARD1, BLM, BMPR1A, BRCA1, BRCA2, BRIP1, CASR, CDC73, CDH1, CDK4, CDKN1B, CDKN1C, CDKN2A (p14ARF), CDKN2A (p16INK4a),  CEBPA, CHEK2, CTNNA1, DICER1, DIS3L2, EGFR (c.2369C>T, p.Thr790Met variant only), EPCAM (Deletion/duplication testing only), FH, FLCN, GATA2, GPC3, GREM1 (Promoter region deletion/duplication testing only), HOXB13 (c.251G>A, p.Gly84Glu), HRAS, KIT, MAX, MEN1, MET, MITF (c.952G>A, p.Glu318Lys variant only), MLH1, MSH2, MSH3, MSH6, MUTYH, NBN, NF1, NF2, NTHL1, PALB2, PDGFRA, PHOX2B, PMS2, POLD1, POLE, POT1, PRKAR1A, PTCH1, PTEN, RAD50, RAD51C, RAD51D, RB1, RECQL4, RET, RUNX1, SDHAF2, SDHA (sequence changes only), SDHB, SDHC, SDHD, SMAD4, SMARCA4, SMARCB1, SMARCE1, STK11, SUFU, TERC, TERT, TMEM127, TP53, TSC1, TSC2, VHL, WRN and WT1.   Results: Negative, no pathogenic variants identified.  The date of this test report is 12/31/2017.    02/02/2019 Imaging   CT CAP W Contrast  IMPRESSION: 1. Status post descending colostomy, without recurrent or metastatic disease. 2. Presacral soft tissue thickening is similar in configuration. New tiny foci of gas within the presacral soft tissues, nonspecific. No well-defined fistulous communication to bowel identified. 3. Possible constipation. 4. Probable mild hepatic steatosis. 5. Interval repair of parastomal hernia.   Aortic Atherosclerosis (ICD10-I70.0).   06/18/2019 Imaging   CT AP W Contrast  IMPRESSION: 1. Stable post treatment changes in presacral region. No evidence of recurrent or metastatic carcinoma within the abdomen or pelvis. 2. New osteolysis involving the superior and inferior vertebral endplates at E3-2, suspicious for infectious discitis. Recommend clinical correlation, and consider lumbar spine MRI without and with contrast for further evaluation.   These results will be called to the ordering clinician or representative by the Radiologist Assistant, and communication documented in the PACS or zVision Dashboard.    06/26/2019 Imaging    MRI Lumbar 06/26/19  IMPRESSION: Extensive abnormality at L3-4 with bone marrow edema and  enhancement as well as extensive endplate irregularity. There is paraspinous soft tissue thickening and enhancement as well as epidural thickening and enhancement. Findings are most likely due to chronic infection. Another possibility would be severe adjacent segment degeneration above the level of fusion at L4-5. Metastatic disease not considered likely.      INTERVAL HISTORY:  Lindsay Little is here for a follow up of rectal cancer. She was last seen by me on 02/18/22 while she was in the hospital. She presents to the clinic accompanied by her father. She reports her pain concern is a deep pelvic cramping pain with vaginal bleeding.   All other systems were reviewed with the patient and are negative.  MEDICAL HISTORY:  Past Medical History:  Diagnosis Date   Anxiety    Chemotherapy-induced neuropathy (HCC)    toes and fingers numbness and tingling   Colorectal delayed anastomotic leak s/p resection & colostomy 07/12/2016 11/17/2015   Colostomy in place [facility]) 07/12/2016   due to anastomosis breakdown w/ colovaginal fistula   Colovaginal fistula s/p omentopexy repair 07/12/2016 07/07/8249   Complication of anesthesia    Depression    Family history of adverse reaction to anesthesia    mother-- ponv   Family history of breast cancer    Family history of pancreatic cancer    Family history of skin cancer    Gastroenteritis 12/20/2015   GERD (gastroesophageal reflux disease)    Hardware complicating wound infection ([place]) 08/03/2019   Hiatal hernia    History of  cancer chemotherapy 02-20-2015 to 03-30-2015   History of cardiac murmur as a child    History of chemotherapy    History of chronic gastritis    History of hypertension    no issues since multiple abdminal sx's and chemo--- no medication since 12/ 2016   History of TMJ disorder    Hypercholesteremia    Hypertension    IBS (irritable bowel syndrome)    dx age 45   Intermittent abdominal pain    post-op multiple  abdominal sx's    Microcytic anemia    Mild sleep apnea    per pt study 2014  very mild osa , no cpap recommended, recommeded wt loss and sleep routine   OA (osteoarthritis)    left knee /  left shoulder   Obesity    Parastomal hernia s/p lap repair w mesh 07/23/2018 07/23/2018   Pelvic abscess s/p drainage & omental pedicle flap 11/17/2015 06/23/2015   PONV (postoperative nausea and vomiting)    severe" needs Scopolamine PATCH"    Portacath in place    right chest   Rectal adenocarcinoma ([place]) oncologis-  dr Burr Medico-- after radiation/ chemo (ypT1, N0) --  no recurrence per last note 03/ 2018   dx 01-13-2015-- Stage IIIC (T3, N2, M0) post concurrent radiation and chemotherapy 02-20-2015 to 03-30-2015 /  05-25-2015 s/p  LAR w/ RSO (post-op complicated by late abscess and contained anatomotic leak w/ help percutaneous drainage and antibiotics)    Rectocutaneous fistula 07/10/2016   Rotator cuff tear, left    S/P radiation therapy 02/20/15-03/30/15   colon/rectal   Vitamin D deficiency    Wears glasses    Wears glasses     SURGICAL HISTORY: Past Surgical History:  Procedure Laterality Date   ABDOMINAL HYSTERECTOMY  1996   uterus and cervix   BACK SURGERY  02/12/2018   lumbar surgery   COLON RESECTION N/A 07/12/2016   Procedure: LAPAROSCOPIC LYSIS OF ADHESIONS, OMENTOPEXY, HAND ASSISTED RESECTION OF  COLON, END TO END ANASTOMOSIS, COLOSTOMY;  Surgeon: Michael Boston, MD;  Location: [facility];  Service: General;  Laterality: N/A;   COMBINED HYSTEROSCOPY DIAGNOSTIC / D&C  x2 1990's   DIAGNOSTIC LAPAROSCOPY  age 41 and age 43   EUS N/A 01/18/2015   Procedure: LOWER ENDOSCOPIC ULTRASOUND (EUS);  Surgeon: Arta Silence, MD;  Location: [facility];  Service: Endoscopy;  Laterality: N/A;   EXCISION OF SKIN TAG  11/17/2015   Procedure: EXCISION OF SKIN TAG;  Surgeon: Michael Boston, MD;  Location: [facility];  Service: General;;   ILEO LOOP COLOSTOMY CLOSURE N/A 11/17/2015   Procedure: LAPAROSCOPIC DIVERTING LOOP  ILEOSTOMY  DRAINAGE OF PELVIC ABSCESS;  Surgeon: Michael Boston, MD;  Location: [facility];  Service: General;  Laterality: N/A;   ILEOSTOMY CLOSURE N/A 05/31/2016   Procedure: TAKEDOWN LOOP ILEOSTOMY;  Surgeon: Michael Boston, MD;  Location: [facility];  Service: General;  Laterality: N/A;   IMPACTION REMOVAL  11/17/2015   Procedure: DISIMPACTION REMOVAL;  Surgeon: Michael Boston, MD;  Location: [facility];  Service: General;;   INSERTION OF MESH N/A 07/23/2018   Procedure: INSERTION OF MESH X2;  Surgeon: Michael Boston, MD;  Location: [facility];  Service: General;  Laterality: N/A;   IR LUMBAR [place] W/IMG GUIDE  07/01/2019   KNEE ARTHROSCOPY Left 1990's   KNEE ARTHROSCOPY W/ MENISCECTOMY Left 09/14/2009   and chondroplasty debridement   LAPAROSCOPIC LYSIS OF ADHESIONS  11/17/2015   Procedure: LAPAROSCOPIC LYSIS OF ADHESIONS;  Surgeon: Michael Boston, MD;  Location:  [facility];  Service: General;;   LUMBAR WOUND DEBRIDEMENT N/A 04/24/2018   Procedure: wound exploration, irrigation and debridement;  Surgeon: Eustace Moore, MD;  Location: [facility];  Service: Neurosurgery;  Laterality: N/A;  wound exploration, irrigation and debridement   LYSIS OF ADHESION N/A 07/23/2018   Procedure: LYSIS OF ADHESIONS;  Surgeon: Michael Boston, MD;  [facility]: [facility];  Service: General;  Laterality: N/A;   PORT-A-CATH REMOVAL N/A 11/21/2016   Procedure: REMOVAL PORT-A-CATH;  Surgeon: Michael Boston, MD;  Location: [facility];  Service: General;  Laterality: N/A;   PORTACATH PLACEMENT N/A 05/25/2015   Procedure: INSERTION PORT-A-CATH;  Surgeon: Michael Boston, MD;  Location: [facility];  Service: General;  Laterality: N/A;-remains inplace Right chest.   ROTATOR CUFF REPAIR Right 2006   TONSILLECTOMY  age 3   VENTRAL HERNIA REPAIR N/A 07/23/2018   Procedure: Central;  Surgeon: Michael Boston, MD;  Location: [facility];  Service: General;  Laterality: N/A;   XI ROBOTIC ASSISTED LOWER ANTERIOR RESECTION N/A  05/25/2015   Procedure: XI ROBOTIC ASSISTED LOWER ANTERIOR RESECTION, , RIGID PROCTOSCOPY, RIGHT OOPHORECTOMY;  Surgeon: Michael Boston, MD;  Location: [facility];  Service: General;  Laterality: N/A;    I have reviewed the social history and family history with the patient and they are unchanged from previous note.  ALLERGIES:  is allergic to caine-1 [lidocaine], bupropion, sulfa antibiotics, pantoprazole, adhesive [tape], doxycycline, flagyl [metronidazole], iron, metoprolol, oxycodone, and penicillins.  MEDICATIONS:  Current Outpatient Medications  Medication Sig Dispense Refill   acetaminophen (TYLENOL) 500 MG tablet Take 1,000 mg by mouth as needed (pain).     bisoprolol (ZEBETA) 10 MG tablet Take 10 mg by mouth daily.     celecoxib (CELEBREX) 100 MG capsule Take 100 mg by mouth 2 (two) times daily.     Cholecalciferol (VITAMIN D3) 5000 units CAPS Take 5,000 Units by mouth at bedtime.      Coenzyme Q10 (COQ10) 200 MG CAPS Take 200 mg by mouth at bedtime.     DULoxetine (CYMBALTA) 60 MG capsule Take 120 mg by mouth daily.     esomeprazole (NEXIUM) 40 MG capsule Take 1 capsule (40 mg total) by mouth 2 (two) times daily before a meal. 60 capsule 5   furosemide (LASIX) 20 MG tablet Take 20 mg by mouth daily as needed for edema.      gabapentin (NEURONTIN) 800 MG tablet Take 800 mg by mouth 3 (three) times daily. (Patient not taking: Reported on 02/15/2022)     HYDROcodone-acetaminophen (NORCO) 10-325 MG tablet Take 1 tablet by mouth every 8 (eight) hours as needed for severe pain. (Patient not taking: Reported on 02/15/2022) 60 tablet 0   HYDROmorphone (DILAUDID) 4 MG tablet Take 4 mg by mouth every 6 (six) hours as needed (breakthrough pain).     levETIRAcetam (KEPPRA) 250 MG tablet Take 250 mg by mouth 2 (two) times daily.     levothyroxine (SYNTHROID) 50 MCG tablet Take 50 mcg by mouth every morning.     Melatonin 3 MG TABS Take 3-6 mg by mouth at bedtime as needed (sleep).     morphine (MS  CONTIN) 15 MG 12 hr tablet Take 15 mg by mouth 2 (two) times daily.     Multiple Vitamin (MULTIVITAMIN WITH MINERALS) TABS tablet Take 2 tablets by mouth at bedtime.      oxybutynin (DITROPAN-XL) 10 MG 24 hr tablet Take 10 mg by mouth daily.     polyethylene glycol (  MIRALAX) 17 g packet Take 17 g by mouth daily. 30 packet 0   pregabalin (LYRICA) 225 MG capsule Take 225 mg by mouth 2 (two) times daily.     promethazine (PHENERGAN) 25 MG tablet TAKE ONE TABLET BY MOUTH EVERY 6 HOURS AS NEEDED FOR NAUSEA OR VOMITING (Patient taking differently: Take 25 mg by mouth every 6 (six) hours as needed for nausea or vomiting.) 60 tablet 0   rosuvastatin (CRESTOR) 5 MG tablet Take 5 mg by mouth at bedtime.     triamcinolone lotion (KENALOG) 0.1 % Apply 1 application topically as needed (dry skin).     No current facility-administered medications for this visit.   Facility-Administered Medications Ordered in Other Visits  Medication Dose Route Frequency Provider Last Rate Last Admin   0.9 %  sodium chloride infusion   Intravenous Once Truitt Merle, MD        PHYSICAL EXAMINATION: ECOG PERFORMANCE STATUS: 2 - Symptomatic, <50% confined to bed  Vitals:   03/13/22 0854  BP: (!) 151/93  Pulse: (!) 113  Resp: 18  Temp: 99.1 F (37.3 C)  SpO2: 99%   Wt Readings from Last 3 Encounters:  03/13/22 (!) 310 lb 4.8 oz (140.8 kg)  02/15/22 (!) 308 lb 10.3 oz (140 kg)  01/10/21 290 lb 3.2 oz (131.6 kg)     GENERAL:alert, no distress and comfortable SKIN: skin color normal, no rashes or significant lesions EYES: normal, Conjunctiva are pink and non-injected, sclera clear  NEURO: alert & oriented x 3 with fluent speech  LABORATORY DATA:  I have reviewed the data as listed    Latest Ref Rng & Units 02/17/2022    5:07 AM 02/16/2022    4:23 AM 02/15/2022    3:50 AM  CBC  WBC 4.0 - 10.5 K/uL 7.7  6.7  8.8   Hemoglobin 12.0 - 15.0 g/dL 11.1  10.6  13.3   Hematocrit 36.0 - 46.0 % 35.8  35.2  42.3   Platelets  150 - 400 K/uL 260  286  455         Latest Ref Rng & Units 02/18/2022    3:40 AM 02/17/2022    5:07 AM 02/16/2022    4:23 AM  CMP  Glucose 70 - 99 mg/dL 95  97  119   BUN 6 - 20 mg/dL 7  8  14    Creatinine 0.44 - 1.00 mg/dL 0.94  0.81  0.89   Sodium 135 - 145 mmol/L 139  139  142   Potassium 3.5 - 5.1 mmol/L 3.7  3.4  3.9   Chloride 98 - 111 mmol/L 108  108  105   CO2 22 - 32 mmol/L 24  24  28    Calcium 8.9 - 10.3 mg/dL 9.1  9.3  9.3   Total Protein 6.5 - 8.1 g/dL   7.7   Total Bilirubin 0.3 - 1.2 mg/dL   0.4   Alkaline Phos 38 - 126 U/L   57   AST 15 - 41 U/L   14   ALT 0 - 44 U/L   13       RADIOGRAPHIC STUDIES: I have personally reviewed the radiological images as listed and agreed with the findings in the report. No results found.    Orders Placed This Encounter  Procedures   CT Biopsy    Standing Status:   Future    Standing Expiration Date:   03/13/2023    Order Specific Question:   Lab orders  requested (DO NOT place separate lab orders, these will be automatically ordered during procedure specimen collection):    Answer:   Surgical Pathology    Order Specific Question:   Reason for Exam (SYMPTOM  OR DIAGNOSIS REQUIRED)    Answer:   biopsy of presacral soft tissue,  rule out malignnency in pelvis    Order Specific Question:   Is patient pregnant?    Answer:   No    Order Specific Question:   Preferred location?    Answer:   [facility]   All questions were answered. The patient knows to call the clinic with any problems, questions or concerns. No barriers to learning was detected. The total time spent in the appointment was 30 minutes.     Truitt Merle, MD 03/13/2022   I, Wilburn Mylar, am acting as scribe for Truitt Merle, MD.   I have reviewed the above documentation for accuracy and completeness, and I agree with the above.

## 2022-03-14 ENCOUNTER — Telehealth: Payer: Self-pay | Admitting: Hematology

## 2022-03-14 NOTE — Telephone Encounter (Signed)
Scheduled follow-up appointment per 8/30 los. Patient is aware.

## 2022-03-15 DIAGNOSIS — K589 Irritable bowel syndrome without diarrhea: Secondary | ICD-10-CM | POA: Diagnosis not present

## 2022-03-15 DIAGNOSIS — N1330 Unspecified hydronephrosis: Secondary | ICD-10-CM | POA: Diagnosis not present

## 2022-03-15 DIAGNOSIS — G8929 Other chronic pain: Secondary | ICD-10-CM | POA: Diagnosis not present

## 2022-03-15 DIAGNOSIS — E039 Hypothyroidism, unspecified: Secondary | ICD-10-CM | POA: Diagnosis not present

## 2022-03-15 DIAGNOSIS — C20 Malignant neoplasm of rectum: Secondary | ICD-10-CM | POA: Diagnosis not present

## 2022-03-15 DIAGNOSIS — E785 Hyperlipidemia, unspecified: Secondary | ICD-10-CM | POA: Diagnosis not present

## 2022-03-15 DIAGNOSIS — I10 Essential (primary) hypertension: Secondary | ICD-10-CM | POA: Diagnosis not present

## 2022-03-15 DIAGNOSIS — Z933 Colostomy status: Secondary | ICD-10-CM | POA: Diagnosis not present

## 2022-03-15 DIAGNOSIS — K5090 Crohn's disease, unspecified, without complications: Secondary | ICD-10-CM | POA: Diagnosis not present

## 2022-03-20 ENCOUNTER — Encounter: Payer: Self-pay | Admitting: Hematology

## 2022-03-21 ENCOUNTER — Other Ambulatory Visit: Payer: Self-pay | Admitting: Radiology

## 2022-03-21 DIAGNOSIS — R1909 Other intra-abdominal and pelvic swelling, mass and lump: Secondary | ICD-10-CM

## 2022-03-25 ENCOUNTER — Other Ambulatory Visit: Payer: Self-pay | Admitting: Internal Medicine

## 2022-03-26 ENCOUNTER — Encounter (HOSPITAL_COMMUNITY): Payer: Self-pay

## 2022-03-26 ENCOUNTER — Other Ambulatory Visit: Payer: Self-pay

## 2022-03-26 ENCOUNTER — Ambulatory Visit (HOSPITAL_COMMUNITY)
Admission: RE | Admit: 2022-03-26 | Discharge: 2022-03-26 | Disposition: A | Discharge status: Home / Self Care | Payer: Medicare HMO | Source: Ambulatory Visit | Attending: Hematology | Admitting: Hematology

## 2022-03-26 DIAGNOSIS — N1330 Unspecified hydronephrosis: Secondary | ICD-10-CM | POA: Diagnosis not present

## 2022-03-26 DIAGNOSIS — N939 Abnormal uterine and vaginal bleeding, unspecified: Secondary | ICD-10-CM | POA: Insufficient documentation

## 2022-03-26 DIAGNOSIS — R591 Generalized enlarged lymph nodes: Secondary | ICD-10-CM | POA: Diagnosis not present

## 2022-03-26 DIAGNOSIS — M7989 Other specified soft tissue disorders: Secondary | ICD-10-CM | POA: Diagnosis not present

## 2022-03-26 DIAGNOSIS — Z9221 Personal history of antineoplastic chemotherapy: Secondary | ICD-10-CM | POA: Diagnosis not present

## 2022-03-26 DIAGNOSIS — R1084 Generalized abdominal pain: Secondary | ICD-10-CM | POA: Diagnosis not present

## 2022-03-26 DIAGNOSIS — Z85048 Personal history of other malignant neoplasm of rectum, rectosigmoid junction, and anus: Secondary | ICD-10-CM | POA: Diagnosis not present

## 2022-03-26 DIAGNOSIS — Z923 Personal history of irradiation: Secondary | ICD-10-CM | POA: Insufficient documentation

## 2022-03-26 DIAGNOSIS — R1909 Other intra-abdominal and pelvic swelling, mass and lump: Secondary | ICD-10-CM | POA: Diagnosis present

## 2022-03-26 DIAGNOSIS — C20 Malignant neoplasm of rectum: Secondary | ICD-10-CM

## 2022-03-26 DIAGNOSIS — D367 Benign neoplasm of other specified sites: Secondary | ICD-10-CM | POA: Diagnosis not present

## 2022-03-26 LAB — PROTIME-INR
INR: 1 (ref 0.8–1.2)
Prothrombin Time: 12.9 seconds (ref 11.4–15.2)

## 2022-03-26 LAB — CBC
HCT: 40.1 % (ref 36.0–46.0)
Hemoglobin: 12.7 g/dL (ref 12.0–15.0)
MCH: 26.5 pg (ref 26.0–34.0)
MCHC: 31.7 g/dL (ref 30.0–36.0)
MCV: 83.7 fL (ref 80.0–100.0)
Platelets: 291 10*3/uL (ref 150–400)
RBC: 4.79 MIL/uL (ref 3.87–5.11)
RDW: 15.6 % — ABNORMAL HIGH (ref 11.5–15.5)
WBC: 10.8 10*3/uL — ABNORMAL HIGH (ref 4.0–10.5)
nRBC: 0 % (ref 0.0–0.2)

## 2022-03-26 MED ORDER — SODIUM CHLORIDE 0.9 % IV SOLN: INTRAVENOUS | Status: DC

## 2022-03-26 MED ORDER — FENTANYL CITRATE (PF) 100 MCG/2ML IJ SOLN
INTRAMUSCULAR | Status: AC
  Filled 2022-03-26: qty 2

## 2022-03-26 MED ORDER — TETRACAINE HCL 1 % IJ SOLN
100.0000 mg | Freq: Once | INTRAMUSCULAR | Status: DC
  Filled 2022-03-26 (×2): qty 10

## 2022-03-26 MED ORDER — FENTANYL CITRATE (PF) 100 MCG/2ML IJ SOLN
INTRAMUSCULAR | Status: AC | PRN
  Administered 2022-03-26 (×2): 50 ug via INTRAVENOUS

## 2022-03-26 MED ORDER — TETRACAINE HCL 1 % IJ SOLN
60.0000 mg | Freq: Once | INTRAMUSCULAR | Status: AC
  Administered 2022-03-26: 6 mL via INTRADERMAL
  Filled 2022-03-26: qty 6

## 2022-03-26 MED ORDER — MIDAZOLAM HCL 2 MG/2ML IJ SOLN
INTRAMUSCULAR | Status: AC | PRN
  Administered 2022-03-26 (×2): 1 mg via INTRAVENOUS

## 2022-03-26 MED ORDER — MIDAZOLAM HCL 2 MG/2ML IJ SOLN
INTRAMUSCULAR | Status: AC
  Filled 2022-03-26: qty 2

## 2022-03-26 NOTE — H&P (Signed)
Chief Complaint: Patient was seen in consultation today for pre sacral mass biopsy at the request of Lindsay Little  Referring Physician(s): Lindsay Little  Supervising Physician: Lindsay Little  Patient Status: [facility] - Out-pt  History of Present Illness: Lindsay Little is a 56 y.o. female   Hx Rectal cancer 2016 Has had persistent abd pain for over 3 years-- worsening Has had persistent vaginal bleeding over 3 years--- worsening Follows with Dr Burr Medico  Evaluation and imaging revealing: PET 03/06/22: IMPRESSION: Findings are highly suspicious for disease recurrence with ureteral obstruction and marked bilateral hydroureteronephrosis. Areas with increased metabolic activity and associated hypermetabolic lymph nodes in the LEFT pelvis as discussed. Parasacral soft tissue at the level of the pelvic floor on the RIGHT may be most accessible for biopsy. No signs of solid organ disease or distant metastatic disease to the chest or abdomen. Periosteal reaction along the anterior sacrum is similar to prior studies in may represent a combination of chronic osteomyelitis and response to adjacent tumor. Heterogeneous but diffuse marrow uptake. Correlate with history of or risk factors for marrow stimulation.  Scheduled now for pre sacral mass biopsy  Past Medical History:  Diagnosis Date   Anxiety    Chemotherapy-induced neuropathy (HCC)    toes and fingers numbness and tingling   Colorectal delayed anastomotic leak s/p resection & colostomy 07/12/2016 11/17/2015   Colostomy in place [facility]) 07/12/2016   due to anastomosis breakdown w/ colovaginal fistula   Colovaginal fistula s/p omentopexy repair 07/12/2016 16/04/9603   Complication of anesthesia    Depression    Family history of adverse reaction to anesthesia    mother-- ponv   Family history of breast cancer    Family history of pancreatic cancer    Family history of skin cancer    Gastroenteritis 12/20/2015   GERD  (gastroesophageal reflux disease)    Hardware complicating wound infection ([place]) 08/03/2019   Hiatal hernia    History of cancer chemotherapy 02-20-2015 to 03-30-2015   History of cardiac murmur as a child    History of chemotherapy    History of chronic gastritis    History of hypertension    no issues since multiple abdminal sx's and chemo--- no medication since 12/ 2016   History of TMJ disorder    Hypercholesteremia    Hypertension    IBS (irritable bowel syndrome)    dx age 72   Intermittent abdominal pain    post-op multiple abdominal sx's    Microcytic anemia    Mild sleep apnea    per pt study 2014  very mild osa , no cpap recommended, recommeded wt loss and sleep routine   OA (osteoarthritis)    left knee /  left shoulder   Obesity    Parastomal hernia s/p lap repair w mesh 07/23/2018 07/23/2018   Pelvic abscess s/p drainage & omental pedicle flap 11/17/2015 06/23/2015   PONV (postoperative nausea and vomiting)    severe" needs Scopolamine PATCH"    Portacath in place    right chest   Rectal adenocarcinoma ([place]) oncologis-  dr Burr Medico-- after radiation/ chemo (ypT1, N0) --  no recurrence per last note 03/ 2018   dx 01-13-2015-- Stage IIIC (T3, N2, M0) post concurrent radiation and chemotherapy 02-20-2015 to 03-30-2015 /  05-25-2015 s/p  LAR w/ RSO (post-op complicated by late abscess and contained anatomotic leak w/ help percutaneous drainage and antibiotics)    Rectocutaneous fistula 07/10/2016   Rotator cuff tear, left    S/P radiation therapy  02/20/15-03/30/15   colon/rectal   Vitamin D deficiency    Wears glasses    Wears glasses     Past Surgical History:  Procedure Laterality Date   ABDOMINAL HYSTERECTOMY  1996   uterus and cervix   BACK SURGERY  02/12/2018   lumbar surgery   COLON RESECTION N/A 07/12/2016   Procedure: LAPAROSCOPIC LYSIS OF ADHESIONS, OMENTOPEXY, HAND ASSISTED RESECTION OF  COLON, END TO END ANASTOMOSIS, COLOSTOMY;  Surgeon: Lindsay Boston, MD;  Location:  [facility];  Service: General;  Laterality: N/A;   COMBINED HYSTEROSCOPY DIAGNOSTIC / D&C  x2 1990's   DIAGNOSTIC LAPAROSCOPY  age 39 and age 23   EUS N/A 01/18/2015   Procedure: LOWER ENDOSCOPIC ULTRASOUND (EUS);  Surgeon: Arta Silence, MD;  Location: [facility];  Service: Endoscopy;  Laterality: N/A;   EXCISION OF SKIN TAG  11/17/2015   Procedure: EXCISION OF SKIN TAG;  Surgeon: Lindsay Boston, MD;  Location: [facility];  Service: General;;   ILEO LOOP COLOSTOMY CLOSURE N/A 11/17/2015   Procedure: LAPAROSCOPIC DIVERTING LOOP ILEOSTOMY  DRAINAGE OF PELVIC ABSCESS;  Surgeon: Lindsay Boston, MD;  Location: [facility];  Service: General;  Laterality: N/A;   ILEOSTOMY CLOSURE N/A 05/31/2016   Procedure: TAKEDOWN LOOP ILEOSTOMY;  Surgeon: Lindsay Boston, MD;  Location: [facility];  Service: General;  Laterality: N/A;   IMPACTION REMOVAL  11/17/2015   Procedure: DISIMPACTION REMOVAL;  Surgeon: Lindsay Boston, MD;  Location: [facility];  Service: General;;   INSERTION OF MESH N/A 07/23/2018   Procedure: INSERTION OF MESH X2;  Surgeon: Lindsay Boston, MD;  Location: [facility];  Service: General;  Laterality: N/A;   IR LUMBAR [place] W/IMG GUIDE  07/01/2019   KNEE ARTHROSCOPY Left 1990's   KNEE ARTHROSCOPY W/ MENISCECTOMY Left 09/14/2009   and chondroplasty debridement   LAPAROSCOPIC LYSIS OF ADHESIONS  11/17/2015   Procedure: LAPAROSCOPIC LYSIS OF ADHESIONS;  Surgeon: Lindsay Boston, MD;  Location: [facility];  Service: General;;   LUMBAR WOUND DEBRIDEMENT N/A 04/24/2018   Procedure: wound exploration, irrigation and debridement;  Surgeon: Eustace Moore, MD;  Location: [place];  Service: Neurosurgery;  Laterality: N/A;  wound exploration, irrigation and debridement   LYSIS OF ADHESION N/A 07/23/2018   Procedure: LYSIS OF ADHESIONS;  Surgeon: Lindsay Boston, MD;  [facility]: [facility];  Service: General;  Laterality: N/A;   PORT-A-CATH REMOVAL N/A 11/21/2016   Procedure: REMOVAL PORT-A-CATH;  Surgeon: Lindsay Boston, MD;  Location: [place];  Service: General;  Laterality: N/A;   PORTACATH PLACEMENT N/A 05/25/2015   Procedure: INSERTION PORT-A-CATH;  Surgeon: Lindsay Boston, MD;  Location: [facility];  Service: General;  Laterality: N/A;-remains inplace Right chest.   ROTATOR CUFF REPAIR Right 2006   TONSILLECTOMY  age 34   VENTRAL HERNIA REPAIR N/A 07/23/2018   Procedure: [place];  Surgeon: Lindsay Boston, MD;  Location: [facility];  Service: General;  Laterality: N/A;   XI ROBOTIC ASSISTED LOWER ANTERIOR RESECTION N/A 05/25/2015   Procedure: XI ROBOTIC ASSISTED LOWER ANTERIOR RESECTION, , RIGID PROCTOSCOPY, RIGHT OOPHORECTOMY;  Surgeon: Lindsay Boston, MD;  Location: [facility];  Service: General;  Laterality: N/A;    Allergies: Caine-1 [lidocaine], Bupropion, Sulfa antibiotics, Pantoprazole, Adhesive [tape], Doxycycline, Flagyl [metronidazole], Iron, Metoprolol, Oxycodone, and Penicillins  Medications: Prior to Admission medications   Medication Sig Start Date End Date Taking? Authorizing Provider  acetaminophen (TYLENOL) 500 MG tablet Take 1,000 mg by mouth every 8 (eight) hours as needed for moderate pain.  Yes [provider]  bisoprolol (ZEBETA) 10 MG tablet Take 10 mg by mouth daily. 11/30/19  Yes [provider]  celecoxib (CELEBREX) 100 MG capsule Take 100 mg by mouth 2 (two) times daily. 12/12/20  Yes [provider]  Cholecalciferol (VITAMIN D3) 5000 units CAPS Take 5,000 Units by mouth at bedtime.    Yes [provider]  Coenzyme Q10 (COQ10) 200 MG CAPS Take 200 mg by mouth at bedtime.   Yes [provider]  DULoxetine (CYMBALTA) 60 MG capsule Take 120 mg by mouth daily.   Yes [provider]  esomeprazole (NEXIUM) 40 MG capsule Take 1 capsule (40 mg total) by mouth 2 (two) times daily before a meal. 12/22/15  Yes Gross, Remo Lipps, MD  furosemide (LASIX) 20 MG tablet Take 20 mg by mouth daily as needed for edema.    Yes [provider]  HYDROmorphone (DILAUDID) 4 MG tablet Take 4 mg by mouth every 6 (six) hours as needed (breakthrough pain). 02/08/22  Yes [provider]  levETIRAcetam (KEPPRA) 250 MG tablet Take 250 mg by mouth 2 (two) times daily.   Yes [provider]  levothyroxine (SYNTHROID) 50 MCG tablet Take 50 mcg by mouth every morning. 02/07/22  Yes [provider]  Melatonin 3 MG TABS Take 3-6 mg by mouth at bedtime as needed (sleep).   Yes [provider]  morphine (MS CONTIN) 15 MG 12 hr tablet Take 15 mg by mouth 2 (two) times daily. 01/29/22  Yes [provider]  Multiple Vitamin (MULTIVITAMIN WITH MINERALS) TABS tablet Take 2 tablets by mouth at bedtime.    Yes [provider]  oxybutynin (DITROPAN-XL) 10 MG 24 hr tablet Take 10 mg by mouth daily. 02/12/22  Yes [provider]  polyethylene glycol (MIRALAX / GLYCOLAX) 17 g packet Take 17 g by mouth daily.   Yes [provider]  pregabalin (LYRICA) 225 MG capsule Take 225 mg by mouth 2 (two) times daily. 02/07/22  Yes [provider]  promethazine (PHENERGAN) 25 MG tablet TAKE ONE TABLET BY MOUTH EVERY 6 HOURS AS NEEDED FOR NAUSEA OR VOMITING Patient taking differently: Take 25 mg by mouth every 6 (six) hours as needed for nausea or vomiting. 07/04/20  Yes Tommy Medal, Lavell Islam, MD  rosuvastatin (CRESTOR) 5 MG tablet Take 5 mg by mouth at bedtime.   Yes [provider]  triamcinolone lotion (KENALOG) 0.1 % Apply 1 application topically as needed (dry skin).   Yes [provider]     Family History  Problem Relation Age of Onset   Coronary artery disease Mother 47   Hypertension Mother    Hyperlipidemia Mother    Diabetes Mellitus I Mother    Coronary artery disease Father    Hyperlipidemia Father    Hypertension Father    Cancer Sister        skin - non melanoma   Hyperlipidemia Brother    Cancer Maternal Uncle 66       pancreatic with mets to colon and  prostate   Cirrhosis Maternal Uncle    Hypertension Maternal Grandmother    Diabetes Mellitus I Maternal Grandmother    Hyperlipidemia Maternal Grandmother    CVA Maternal Grandmother    Hypertension Maternal Grandfather    Coronary artery disease Maternal Grandfather 83   Hyperlipidemia Maternal Grandfather    Coronary artery disease Paternal Grandmother    Hypertension Paternal Grandmother    Hyperlipidemia Paternal Grandmother    Diabetes  Mellitus I Paternal Grandmother    Hypertension Paternal Grandfather    Hyperlipidemia Paternal Grandfather    Coronary artery disease Paternal Grandfather     Social History   Socioeconomic History   Marital status: Married    Spouse name: Not on file   Number of children: Not on file   Years of education: Not on file   Highest education level: Not on file  Occupational History   Not on file  Tobacco Use   Smoking status: Former    Packs/day: 0.25    Years: 7.00    Total pack years: 1.75    Types: Cigarettes    Quit date: 07/16/1991    Years since quitting: 30.7   Smokeless tobacco: Never  Vaping Use   Vaping Use: Never used  Substance and Sexual Activity   Alcohol use: No   Drug use: Never   Sexual activity: Yes    Birth control/protection: Surgical  Other Topics Concern   Not on file  Social History Narrative   Tobacco Use: Cigarettes - Former Smoker   Alcohol: Yes, very rare, liquor.    No recreational drug use   Occupation: Head CMA @ [employer]   Marital Status: Married    Husband: Roselyn Reef Disabled   Children: 2 adopted kids [place]   Religion: [employer] [place]               Social Determinants of Health   Financial Resource Strain: Not on file  Food Insecurity: Not on file  Transportation Needs: Not on file  Physical Activity: Not on file  Stress: Not on file  Social Connections: Not on file    Review of Systems: A 12 point ROS discussed and pertinent positives are indicated in the  HPI above.  All other systems are negative.  Review of Systems  Constitutional:  Negative for activity change, fatigue and fever.  Respiratory:  Negative for cough and shortness of breath.   Gastrointestinal:  Positive for abdominal pain.  Genitourinary:  Positive for vaginal bleeding.  Musculoskeletal:  Positive for back pain.  Neurological:  Negative for weakness.  Psychiatric/Behavioral:  Negative for behavioral problems and confusion.     Vital Signs: BP (!) 160/90   Pulse 83   Temp 98.1 F (36.7 C) (Tympanic)   Resp 18   Ht 5' 11"  (1.803 m)   Wt (!) 305 lb (138.3 kg)   SpO2 96%   BMI 42.54 kg/m     Physical Exam Vitals reviewed.  HENT:     Mouth/Throat:     Mouth: Mucous membranes are moist.  Cardiovascular:     Rate and Rhythm: Normal rate and regular rhythm.     Heart sounds: Normal heart sounds.  Pulmonary:     Effort: Pulmonary effort is normal.     Breath sounds: Normal breath sounds. No wheezing.  Abdominal:     Palpations: Abdomen is soft.     Tenderness: There is abdominal tenderness.     Comments: Tender low abdomen  Musculoskeletal:        General: Normal range of motion.  Skin:    General: Skin is warm.  Neurological:     Mental Status: She is alert and oriented to person, place, and time.  Psychiatric:        Behavior: Behavior normal.     Imaging: NM PET Image Initial (PI) Skull Base To Thigh  Result Date: 03/08/2022 CLINICAL DATA:  Initial treatment strategy for rectal cancer  with concern for recurrence. EXAM: NUCLEAR MEDICINE PET SKULL BASE TO THIGH TECHNIQUE: 16.12 mCi F-18 FDG was injected intravenously. Full-ring PET imaging was performed from the skull base to thigh after the radiotracer. CT data was obtained and used for attenuation correction and anatomic localization. Fasting blood glucose: 114 mg/dl COMPARISON:  Multiple prior studies most recent is a CT of the abdomen and pelvis from February 15, 2022. FINDINGS: Mediastinal blood pool  activity: SUV max 3.67 Liver activity: SUV max NA NECK: No hypermetabolic lymph nodes in the neck. Incidental CT findings: None. CHEST: No hypermetabolic mediastinal or hilar nodes. No suspicious pulmonary nodules on the CT scan. Incidental CT findings: Moderate cardiomegaly. No pericardial effusion. Normal caliber of the thoracic aorta and central pulmonary vasculature. Limited assessment of cardiovascular structures given lack of intravenous contrast. Patchy areas of ground-glass in the chest mainly likely reflects atelectasis. ABDOMEN/PELVIS: Markedly increased metabolic activity associated with the LEFT common iliac lymph node (image 165/4) 9 mm with a maximum SUV of 6.89. Markedly increased metabolic activity associated with soft tissue that is increased throughout the pelvis with irregular margins on previous CT about the posterior pelvis above the small amount of remaining rectum and anus. This soft tissue obstructs the ureters as demonstrated on recent CT imaging measuring approximately 17 mm greatest thickness (image 188/4. Soft tissue blends into the LEFT greater than RIGHT piriformis muscle and is associated with marked increased metabolic activity with a maximum SUV of 15.43 (image 25/4) this tracks along the remaining rectum and anus thickened up to 2.5 cm on the RIGHT on image 191/4. RIGHT-sided metabolic activity is similar. LEFT groin lymph node with moderate metabolic activity showing a maximum SUV of 5.38 (image 202/4) 9 mm Incidental CT findings: Lobular hepatic contours with signs of fissural widening. No acute findings relative to the gallbladder, pancreas, spleen or adrenal glands. Marked bilateral hydroureteronephrosis, AP dimension of the RIGHT renal pelvis at 3.4 cm is similar to recent imaging. No substantial perinephric stranding. Ureteral transition at soft tissue in the pelvis. Urinary bladder under distended. Small bowel without signs of substantial dilation but in close proximity to  soft tissue in the pelvis as is the cecum. No acute gastrointestinal findings. LEFT upper quadrant colostomy. SKELETON: Heterogeneous and generalized increased metabolic activity throughout the spine without focal lesion. Mildly diminished activity in the pelvis likely related to prior radiation. Would also correlate with any marrow stimulation that may be on going that might explain generalized increased marrow space uptake in the spine, bilateral proximal humeri and to a lesser extent in the proximal femurs. Increased metabolic activity in soft tissue tracks along the anterior sacrum which is irregular but is unchanged over studies dating back to December of 2020 with respect to the bony periosteal changes. Incidental CT findings: Signs of lumbar spinal fusion similar to recent imaging at L4-5. Spinal degenerative changes. IMPRESSION: Findings are highly suspicious for disease recurrence with ureteral obstruction and marked bilateral hydroureteronephrosis. Areas with increased metabolic activity and associated hypermetabolic lymph nodes in the LEFT pelvis as discussed. Parasacral soft tissue at the level of the pelvic floor on the RIGHT may be most accessible for biopsy. No signs of solid organ disease or distant metastatic disease to the chest or abdomen. Periosteal reaction along the anterior sacrum is similar to prior studies in may represent a combination of chronic osteomyelitis and response to adjacent tumor. Heterogeneous but diffuse marrow uptake. Correlate with history of or risk factors for marrow stimulation. Electronically Signed   By: Cay Schillings  Wile M.D.   On: 03/08/2022 10:04    Labs:  CBC: Recent Labs    02/15/22 0350 02/16/22 0423 02/17/22 0507  WBC 8.8 6.7 7.7  HGB 13.3 10.6* 11.1*  HCT 42.3 35.2* 35.8*  PLT 455* 286 260    COAGS: Recent Labs    02/15/22 0350  INR 1.0    BMP: Recent Labs    02/15/22 0350 02/16/22 0423 02/17/22 0507 02/18/22 0340  NA 141 142 139 139   K 3.3* 3.9 3.4* 3.7  CL 101 105 108 108  CO2 26 28 24 24   GLUCOSE 164* 119* 97 95  BUN 18 14 8 7   CALCIUM 10.1 9.3 9.3 9.1  CREATININE 0.94 0.89 0.81 0.94  GFRNONAA >60 >60 >60 >60    LIVER FUNCTION TESTS: Recent Labs    02/15/22 0350 02/16/22 0423  BILITOT 0.6 0.4  AST 9* 14*  ALT 10 13  ALKPHOS 67 57  PROT 8.5* 7.7  ALBUMIN 4.1 3.4*    TUMOR MARKERS: No results for input(s): "AFPTM", "CEA", "CA199", "CHROMGRNA" in the last 8760 hours.  Assessment and Plan:  Hx rectal cancer 2016 Abd pain and vaginal bleeding for quite some time-- worsening New PET revealing Lymphadenopathy and presacral mass Now scheduled for presacral mass biopsy Risks and benefits of presacral mass biopsy was discussed with the patient and/or patient's family including, but not limited to bleeding, infection, damage to adjacent structures or low yield requiring additional tests.  All of the questions were answered and there is agreement to proceed. Consent signed and in chart.   Thank you for this interesting consult.  I greatly enjoyed meeting KISHA MESSMAN and look forward to participating in their care.  A copy of this report was sent to the requesting provider on this date.  Electronically Signed: Lavonia Drafts, PA-C 03/26/2022, 8:51 AM   I spent a total of  30 Minutes   in face to face in clinical consultation, greater than 50% of which was counseling/coordinating care for presacral mass biopsy

## 2022-03-26 NOTE — Procedures (Signed)
Interventional Radiology Procedure Note  Procedure: CT guided core biopsy of presacral soft tissue mass  Complications: None  Estimated Blood Loss: None  Recommendations: - DC home   Signed,  Criselda Peaches, MD

## 2022-03-27 ENCOUNTER — Inpatient Hospital Stay: Payer: Medicare HMO | Admitting: Hematology

## 2022-03-27 LAB — SURGICAL PATHOLOGY

## 2022-03-29 ENCOUNTER — Telehealth: Payer: Medicare HMO | Admitting: Hematology

## 2022-04-03 ENCOUNTER — Telehealth: Payer: Self-pay

## 2022-04-03 NOTE — Telephone Encounter (Signed)
LVM letting pt know that Dr. Burr Medico sent her a MyChart message on 03/28/2022 regarding her biopsy results (pt last longed onto [facility] 04/01/2022).  Informed pt that Dr. Burr Medico would like to cancel her f/u appt since the pt is aware of her results.  Instructed pt to call Dr. Ernestina Penna office if she feel that the appt w/Dr. Burr Medico is still needed.

## 2022-04-08 ENCOUNTER — Telehealth: Payer: Medicare HMO | Admitting: Hematology

## 2022-05-31 DIAGNOSIS — M5136 Other intervertebral disc degeneration, lumbar region: Secondary | ICD-10-CM | POA: Diagnosis not present

## 2022-05-31 DIAGNOSIS — G894 Chronic pain syndrome: Secondary | ICD-10-CM | POA: Diagnosis not present

## 2022-05-31 DIAGNOSIS — Z79891 Long term (current) use of opiate analgesic: Secondary | ICD-10-CM | POA: Diagnosis not present

## 2022-05-31 DIAGNOSIS — Z5181 Encounter for therapeutic drug level monitoring: Secondary | ICD-10-CM | POA: Diagnosis not present

## 2022-05-31 DIAGNOSIS — M48061 Spinal stenosis, lumbar region without neurogenic claudication: Secondary | ICD-10-CM | POA: Diagnosis not present

## 2022-05-31 DIAGNOSIS — M4726 Other spondylosis with radiculopathy, lumbar region: Secondary | ICD-10-CM | POA: Diagnosis not present

## 2022-05-31 DIAGNOSIS — G620 Drug-induced polyneuropathy: Secondary | ICD-10-CM | POA: Diagnosis not present

## 2022-05-31 DIAGNOSIS — C20 Malignant neoplasm of rectum: Secondary | ICD-10-CM | POA: Diagnosis not present

## 2022-05-31 DIAGNOSIS — Z85048 Personal history of other malignant neoplasm of rectum, rectosigmoid junction, and anus: Secondary | ICD-10-CM | POA: Diagnosis not present

## 2022-05-31 DIAGNOSIS — M47816 Spondylosis without myelopathy or radiculopathy, lumbar region: Secondary | ICD-10-CM | POA: Diagnosis not present

## 2022-06-12 ENCOUNTER — Encounter: Payer: Self-pay | Admitting: Hematology

## 2022-06-12 ENCOUNTER — Other Ambulatory Visit (HOSPITAL_COMMUNITY): Payer: Self-pay

## 2022-06-12 MED ORDER — HYDROMORPHONE HCL ER 8 MG PO TB24
8.0000 mg | ORAL_TABLET | Freq: Every day | ORAL | 0 refills | Status: DC
  Filled 2022-06-12 – 2022-07-10 (×2): qty 30, 30d supply, fill #0

## 2022-06-13 ENCOUNTER — Other Ambulatory Visit (HOSPITAL_COMMUNITY): Payer: Self-pay

## 2022-06-13 ENCOUNTER — Encounter: Payer: Self-pay | Admitting: Hematology

## 2022-06-14 DIAGNOSIS — N3040 Irradiation cystitis without hematuria: Secondary | ICD-10-CM | POA: Diagnosis not present

## 2022-06-14 DIAGNOSIS — N1330 Unspecified hydronephrosis: Secondary | ICD-10-CM | POA: Diagnosis not present

## 2022-06-14 DIAGNOSIS — N131 Hydronephrosis with ureteral stricture, not elsewhere classified: Secondary | ICD-10-CM | POA: Diagnosis not present

## 2022-06-14 DIAGNOSIS — F32A Depression, unspecified: Secondary | ICD-10-CM | POA: Diagnosis not present

## 2022-06-14 DIAGNOSIS — K589 Irritable bowel syndrome without diarrhea: Secondary | ICD-10-CM | POA: Diagnosis not present

## 2022-06-14 DIAGNOSIS — N3941 Urge incontinence: Secondary | ICD-10-CM | POA: Diagnosis not present

## 2022-06-14 DIAGNOSIS — N3946 Mixed incontinence: Secondary | ICD-10-CM | POA: Diagnosis not present

## 2022-06-14 DIAGNOSIS — N393 Stress incontinence (female) (male): Secondary | ICD-10-CM | POA: Diagnosis not present

## 2022-06-14 DIAGNOSIS — I10 Essential (primary) hypertension: Secondary | ICD-10-CM | POA: Diagnosis not present

## 2022-06-14 DIAGNOSIS — E785 Hyperlipidemia, unspecified: Secondary | ICD-10-CM | POA: Diagnosis not present

## 2022-07-10 ENCOUNTER — Other Ambulatory Visit (HOSPITAL_COMMUNITY): Payer: Self-pay

## 2022-07-10 MED ORDER — HYDROMORPHONE HCL 4 MG PO TABS
4.0000 mg | ORAL_TABLET | Freq: Four times a day (QID) | ORAL | 0 refills | Status: DC | PRN
  Filled 2022-07-10: qty 120, 30d supply, fill #0

## 2022-07-15 ENCOUNTER — Emergency Department (HOSPITAL_COMMUNITY): Payer: Medicare HMO

## 2022-07-15 ENCOUNTER — Inpatient Hospital Stay (HOSPITAL_COMMUNITY)
Admission: EM | Admit: 2022-07-15 | Discharge: 2022-07-20 | DRG: 390 | Disposition: A | Discharge status: Home / Self Care | Payer: Medicare HMO | Attending: Internal Medicine | Admitting: Internal Medicine

## 2022-07-15 ENCOUNTER — Other Ambulatory Visit: Payer: Self-pay

## 2022-07-15 ENCOUNTER — Inpatient Hospital Stay (HOSPITAL_COMMUNITY): Payer: Medicare HMO

## 2022-07-15 ENCOUNTER — Encounter (HOSPITAL_COMMUNITY): Payer: Self-pay | Admitting: Surgery

## 2022-07-15 DIAGNOSIS — R231 Pallor: Secondary | ICD-10-CM | POA: Diagnosis not present

## 2022-07-15 DIAGNOSIS — Z85048 Personal history of other malignant neoplasm of rectum, rectosigmoid junction, and anus: Secondary | ICD-10-CM | POA: Diagnosis not present

## 2022-07-15 DIAGNOSIS — Z885 Allergy status to narcotic agent status: Secondary | ICD-10-CM | POA: Diagnosis not present

## 2022-07-15 DIAGNOSIS — K2950 Unspecified chronic gastritis without bleeding: Secondary | ICD-10-CM | POA: Diagnosis present

## 2022-07-15 DIAGNOSIS — Z923 Personal history of irradiation: Secondary | ICD-10-CM

## 2022-07-15 DIAGNOSIS — K565 Intestinal adhesions [bands], unspecified as to partial versus complete obstruction: Principal | ICD-10-CM | POA: Diagnosis present

## 2022-07-15 DIAGNOSIS — G4733 Obstructive sleep apnea (adult) (pediatric): Secondary | ICD-10-CM | POA: Diagnosis present

## 2022-07-15 DIAGNOSIS — C20 Malignant neoplasm of rectum: Secondary | ICD-10-CM | POA: Diagnosis present

## 2022-07-15 DIAGNOSIS — Z9221 Personal history of antineoplastic chemotherapy: Secondary | ICD-10-CM

## 2022-07-15 DIAGNOSIS — E039 Hypothyroidism, unspecified: Secondary | ICD-10-CM | POA: Diagnosis present

## 2022-07-15 DIAGNOSIS — Z90710 Acquired absence of both cervix and uterus: Secondary | ICD-10-CM

## 2022-07-15 DIAGNOSIS — R1010 Upper abdominal pain, unspecified: Secondary | ICD-10-CM | POA: Diagnosis not present

## 2022-07-15 DIAGNOSIS — Z960 Presence of urogenital implants: Secondary | ICD-10-CM | POA: Diagnosis present

## 2022-07-15 DIAGNOSIS — I10 Essential (primary) hypertension: Secondary | ICD-10-CM | POA: Diagnosis not present

## 2022-07-15 DIAGNOSIS — I878 Other specified disorders of veins: Secondary | ICD-10-CM | POA: Diagnosis not present

## 2022-07-15 DIAGNOSIS — Z880 Allergy status to penicillin: Secondary | ICD-10-CM

## 2022-07-15 DIAGNOSIS — Z87891 Personal history of nicotine dependence: Secondary | ICD-10-CM

## 2022-07-15 DIAGNOSIS — Z888 Allergy status to other drugs, medicaments and biological substances status: Secondary | ICD-10-CM

## 2022-07-15 DIAGNOSIS — R000 Tachycardia, unspecified: Secondary | ICD-10-CM | POA: Diagnosis not present

## 2022-07-15 DIAGNOSIS — Z882 Allergy status to sulfonamides status: Secondary | ICD-10-CM

## 2022-07-15 DIAGNOSIS — D638 Anemia in other chronic diseases classified elsewhere: Secondary | ICD-10-CM | POA: Diagnosis not present

## 2022-07-15 DIAGNOSIS — Z8249 Family history of ischemic heart disease and other diseases of the circulatory system: Secondary | ICD-10-CM

## 2022-07-15 DIAGNOSIS — G894 Chronic pain syndrome: Secondary | ICD-10-CM | POA: Diagnosis not present

## 2022-07-15 DIAGNOSIS — Z79890 Hormone replacement therapy (postmenopausal): Secondary | ICD-10-CM | POA: Diagnosis not present

## 2022-07-15 DIAGNOSIS — Z79891 Long term (current) use of opiate analgesic: Secondary | ICD-10-CM

## 2022-07-15 DIAGNOSIS — Z933 Colostomy status: Secondary | ICD-10-CM | POA: Diagnosis not present

## 2022-07-15 DIAGNOSIS — Z79899 Other long term (current) drug therapy: Secondary | ICD-10-CM | POA: Diagnosis not present

## 2022-07-15 DIAGNOSIS — K6389 Other specified diseases of intestine: Secondary | ICD-10-CM | POA: Diagnosis not present

## 2022-07-15 DIAGNOSIS — E7800 Pure hypercholesterolemia, unspecified: Secondary | ICD-10-CM | POA: Diagnosis present

## 2022-07-15 DIAGNOSIS — K56609 Unspecified intestinal obstruction, unspecified as to partial versus complete obstruction: Secondary | ICD-10-CM | POA: Diagnosis present

## 2022-07-15 DIAGNOSIS — Z83438 Family history of other disorder of lipoprotein metabolism and other lipidemia: Secondary | ICD-10-CM

## 2022-07-15 DIAGNOSIS — Z9049 Acquired absence of other specified parts of digestive tract: Secondary | ICD-10-CM | POA: Diagnosis not present

## 2022-07-15 DIAGNOSIS — M47816 Spondylosis without myelopathy or radiculopathy, lumbar region: Secondary | ICD-10-CM | POA: Diagnosis not present

## 2022-07-15 DIAGNOSIS — G620 Drug-induced polyneuropathy: Secondary | ICD-10-CM | POA: Diagnosis present

## 2022-07-15 DIAGNOSIS — F32A Depression, unspecified: Secondary | ICD-10-CM | POA: Diagnosis present

## 2022-07-15 DIAGNOSIS — Z881 Allergy status to other antibiotic agents status: Secondary | ICD-10-CM

## 2022-07-15 DIAGNOSIS — K760 Fatty (change of) liver, not elsewhere classified: Secondary | ICD-10-CM | POA: Diagnosis not present

## 2022-07-15 DIAGNOSIS — K219 Gastro-esophageal reflux disease without esophagitis: Secondary | ICD-10-CM | POA: Diagnosis present

## 2022-07-15 DIAGNOSIS — R1084 Generalized abdominal pain: Secondary | ICD-10-CM | POA: Diagnosis not present

## 2022-07-15 DIAGNOSIS — F419 Anxiety disorder, unspecified: Secondary | ICD-10-CM | POA: Diagnosis present

## 2022-07-15 DIAGNOSIS — T451X5A Adverse effect of antineoplastic and immunosuppressive drugs, initial encounter: Secondary | ICD-10-CM | POA: Diagnosis present

## 2022-07-15 DIAGNOSIS — R161 Splenomegaly, not elsewhere classified: Secondary | ICD-10-CM | POA: Diagnosis not present

## 2022-07-15 DIAGNOSIS — Z91048 Other nonmedicinal substance allergy status: Secondary | ICD-10-CM

## 2022-07-15 DIAGNOSIS — K5669 Other partial intestinal obstruction: Secondary | ICD-10-CM | POA: Diagnosis not present

## 2022-07-15 LAB — COMPREHENSIVE METABOLIC PANEL
ALT: 16 U/L (ref 0–44)
AST: 24 U/L (ref 15–41)
Albumin: 4.1 g/dL (ref 3.5–5.0)
Alkaline Phosphatase: 75 U/L (ref 38–126)
Anion gap: 14 (ref 5–15)
BUN: 14 mg/dL (ref 6–20)
CO2: 18 mmol/L — ABNORMAL LOW (ref 22–32)
Calcium: 10.5 mg/dL — ABNORMAL HIGH (ref 8.9–10.3)
Chloride: 104 mmol/L (ref 98–111)
Creatinine, Ser: 0.96 mg/dL (ref 0.44–1.00)
GFR, Estimated: >60 mL/min (ref >60)
Glucose, Bld: 202 mg/dL — ABNORMAL HIGH (ref 70–99)
Potassium: 3.9 mmol/L (ref 3.5–5.1)
Sodium: 136 mmol/L (ref 135–145)
Total Bilirubin: 0.6 mg/dL (ref 0.3–1.2)
Total Protein: 9 g/dL — ABNORMAL HIGH (ref 6.5–8.1)

## 2022-07-15 LAB — LIPASE, BLOOD: Lipase: 30 U/L (ref 11–51)

## 2022-07-15 LAB — LACTIC ACID, PLASMA
Lactic Acid, Venous: 3.1 mmol/L (ref 0.5–1.9)
Lactic Acid, Venous: 3.1 mmol/L (ref 0.5–1.9)
Lactic Acid, Venous: 1.4 mmol/L (ref 0.5–1.9)
Lactic Acid, Venous: 1.2 mmol/L (ref 0.5–1.9)

## 2022-07-15 LAB — CBC WITH DIFFERENTIAL/PLATELET
Abs Immature Granulocytes: 0.1 10*3/uL — ABNORMAL HIGH (ref 0.00–0.07)
Basophils Absolute: 0.1 10*3/uL (ref 0.0–0.1)
Basophils Relative: 1 %
Eosinophils Absolute: 0 10*3/uL (ref 0.0–0.5)
Eosinophils Relative: 0 %
HCT: 45.2 % (ref 36.0–46.0)
Hemoglobin: 14 g/dL (ref 12.0–15.0)
Immature Granulocytes: 1 %
Lymphocytes Relative: 10 %
Lymphs Abs: 1.5 10*3/uL (ref 0.7–4.0)
MCH: 25.3 pg — ABNORMAL LOW (ref 26.0–34.0)
MCHC: 31 g/dL (ref 30.0–36.0)
MCV: 81.7 fL (ref 80.0–100.0)
Monocytes Absolute: 0.6 10*3/uL (ref 0.1–1.0)
Monocytes Relative: 4 %
Neutro Abs: 13.1 10*3/uL — ABNORMAL HIGH (ref 1.7–7.7)
Neutrophils Relative %: 84 %
Platelets: 481 10*3/uL — ABNORMAL HIGH (ref 150–400)
RBC: 5.53 MIL/uL — ABNORMAL HIGH (ref 3.87–5.11)
RDW: 15.9 % — ABNORMAL HIGH (ref 11.5–15.5)
WBC: 15.4 10*3/uL — ABNORMAL HIGH (ref 4.0–10.5)
nRBC: 0 % (ref 0.0–0.2)

## 2022-07-15 LAB — PHOSPHORUS: Phosphorus: <1 mg/dL — CL (ref 2.5–4.6)

## 2022-07-15 LAB — CBG MONITORING, ED: Glucose-Capillary: 197 mg/dL — ABNORMAL HIGH (ref 70–99)

## 2022-07-15 LAB — MAGNESIUM: Magnesium: 1.7 mg/dL (ref 1.7–2.4)

## 2022-07-15 MED ORDER — SODIUM CHLORIDE 0.9 % IV SOLN: Freq: Once | INTRAVENOUS | Status: AC

## 2022-07-15 MED ORDER — IOHEXOL 300 MG/ML  SOLN
100.0000 mL | Freq: Once | INTRAMUSCULAR | Status: AC | PRN
  Administered 2022-07-15: 100 mL via INTRAVENOUS

## 2022-07-15 MED ORDER — ACETAMINOPHEN 325 MG PO TABS: 650.0000 mg | ORAL_TABLET | Freq: Four times a day (QID) | ORAL | Status: DC | PRN

## 2022-07-15 MED ORDER — METOCLOPRAMIDE HCL 5 MG/ML IJ SOLN
10.0000 mg | Freq: Once | INTRAMUSCULAR | Status: AC
  Administered 2022-07-15: 10 mg via INTRAVENOUS
  Filled 2022-07-15: qty 2

## 2022-07-15 MED ORDER — ONDANSETRON HCL 4 MG PO TABS: 4.0000 mg | ORAL_TABLET | Freq: Four times a day (QID) | ORAL | Status: DC | PRN

## 2022-07-15 MED ORDER — ONDANSETRON HCL 4 MG/2ML IJ SOLN
4.0000 mg | Freq: Once | INTRAMUSCULAR | Status: AC
  Administered 2022-07-15: 4 mg via INTRAVENOUS
  Filled 2022-07-15: qty 2

## 2022-07-15 MED ORDER — PROCHLORPERAZINE EDISYLATE 10 MG/2ML IJ SOLN
10.0000 mg | Freq: Once | INTRAMUSCULAR | Status: AC
  Administered 2022-07-15: 10 mg via INTRAVENOUS
  Filled 2022-07-15: qty 2

## 2022-07-15 MED ORDER — SODIUM CHLORIDE 0.9 % IV SOLN
12.5000 mg | Freq: Once | INTRAVENOUS | Status: AC
  Administered 2022-07-15: 12.5 mg via INTRAVENOUS
  Filled 2022-07-15: qty 12.5

## 2022-07-15 MED ORDER — ENOXAPARIN SODIUM 40 MG/0.4ML IJ SOSY
40.0000 mg | PREFILLED_SYRINGE | INTRAMUSCULAR | Status: DC
  Administered 2022-07-15 – 2022-07-19 (×5): 40 mg via SUBCUTANEOUS
  Filled 2022-07-15 (×5): qty 0.4

## 2022-07-15 MED ORDER — ONDANSETRON HCL 4 MG/2ML IJ SOLN
4.0000 mg | Freq: Four times a day (QID) | INTRAMUSCULAR | Status: DC | PRN
  Administered 2022-07-15 – 2022-07-16 (×4): 4 mg via INTRAVENOUS
  Filled 2022-07-15 (×4): qty 2

## 2022-07-15 MED ORDER — SODIUM CHLORIDE 0.9 % IV BOLUS: 1000.0000 mL | Freq: Once | INTRAVENOUS | Status: DC

## 2022-07-15 MED ORDER — DIATRIZOATE MEGLUMINE & SODIUM 66-10 % PO SOLN
90.0000 mL | Freq: Once | ORAL | Status: AC
  Administered 2022-07-15: 90 mL via ORAL
  Filled 2022-07-15: qty 90

## 2022-07-15 MED ORDER — SODIUM CHLORIDE (PF) 0.9 % IJ SOLN
INTRAMUSCULAR | Status: AC
  Filled 2022-07-15: qty 50

## 2022-07-15 MED ORDER — HYDROMORPHONE HCL 1 MG/ML IJ SOLN
0.5000 mg | Freq: Once | INTRAMUSCULAR | Status: AC
  Administered 2022-07-15: 0.5 mg via INTRAVENOUS
  Filled 2022-07-15: qty 1

## 2022-07-15 MED ORDER — POTASSIUM PHOSPHATES 15 MMOLE/5ML IV SOLN
30.0000 mmol | Freq: Once | INTRAVENOUS | Status: AC
  Administered 2022-07-15: 30 mmol via INTRAVENOUS
  Filled 2022-07-15: qty 10

## 2022-07-15 MED ORDER — HYDROMORPHONE HCL 1 MG/ML IJ SOLN
1.0000 mg | INTRAMUSCULAR | Status: DC | PRN
  Administered 2022-07-15 (×3): 1 mg via INTRAVENOUS
  Filled 2022-07-15 (×3): qty 1

## 2022-07-15 MED ORDER — SODIUM CHLORIDE 0.9 % IV BOLUS
1000.0000 mL | Freq: Once | INTRAVENOUS | Status: AC
  Administered 2022-07-15: 1000 mL via INTRAVENOUS

## 2022-07-15 MED ORDER — SODIUM CHLORIDE 0.9 % IV SOLN: INTRAVENOUS | Status: DC

## 2022-07-15 MED ORDER — HYDROMORPHONE HCL 1 MG/ML IJ SOLN
1.0000 mg | INTRAMUSCULAR | Status: DC | PRN
  Administered 2022-07-15 – 2022-07-17 (×11): 1 mg via INTRAVENOUS
  Filled 2022-07-15 (×11): qty 1

## 2022-07-15 MED ORDER — SODIUM CHLORIDE 0.9 % IV BOLUS
2000.0000 mL | Freq: Once | INTRAVENOUS | Status: AC
  Administered 2022-07-15: 2000 mL via INTRAVENOUS

## 2022-07-15 MED ORDER — MAGNESIUM SULFATE 2 GM/50ML IV SOLN
2.0000 g | Freq: Once | INTRAVENOUS | Status: AC
  Administered 2022-07-15: 2 g via INTRAVENOUS
  Filled 2022-07-15: qty 50

## 2022-07-15 MED ORDER — ACETAMINOPHEN 650 MG RE SUPP: 650.0000 mg | Freq: Four times a day (QID) | RECTAL | Status: DC | PRN

## 2022-07-15 NOTE — Progress Notes (Signed)
Receive pt from ED, new order for NGT was acknowledged, pt refused to have NGT be place unless she is put to sleep, surgeon was also aware base on the notes. will give the gastrografin orally once medication is available. Dr. Budd Palmer.

## 2022-07-15 NOTE — ED Triage Notes (Signed)
[name] for evaluation of nausea, vomiting, and diarrhea x 24 hours.  Has ostomy.  Took Zofran and Phenergan at home PTA without improvement.  No reports of fever.

## 2022-07-15 NOTE — Consult Note (Signed)
Reason for Consult: recurrent small bowel obstruction  Referring Physician: Elvina Sidle ER physician / PA  GERLDINE SULEIMAN is an 57 y.o. female.  HPI: Patient is a 57 year old female well-known to our surgical service having undergone multiple procedures since 2016 by Dr. Neysa Bonito.  Patient underwent abdominal resection of rectal carcinoma in 2016.  She had postoperative complications including an anastomotic leak.  She now has a left-sided colostomy.  Patient has had previous small bowel obstructions.  She was most recently admitted for small bowel obstruction in August 2023.  This resolved without placement of a nasogastric tube and without surgical intervention.  Patient developed symptoms over the past 24 hours of abdominal pain, nausea, and emesis.  She presented to the emergency department for evaluation.  Laboratory studies were notable for a mildly elevated white blood cell count of 15.4 and a mildly elevated lactate level of 3.1.  CT scan of the abdomen and pelvis demonstrated an intermediate grade small bowel obstruction with a transition point in the left lower quadrant most likely due to adhesions.  Other findings on CT scan include soft tissue thickening in the pelvis consistent with recurrent tumor, an air-fluid level just above the pelvic floor possibly representing small bowel or fistula, both of these findings noted on previous CT scans.  There has been interval enlargement of right lower quadrant mesenteric lymph nodes which could be metastatic.  There is also been interval placement of ureteral stents.  General surgery is now asked to evaluate and assist with management of small bowel obstruction.  Past Medical History:  Diagnosis Date   Anxiety    Chemotherapy-induced neuropathy (HCC)    toes and fingers numbness and tingling   Colorectal delayed anastomotic leak s/p resection & colostomy 07/12/2016 11/17/2015   Colostomy in place [facility]) 07/12/2016   due to anastomosis  breakdown w/ colovaginal fistula   Colovaginal fistula s/p omentopexy repair 07/12/2016 28/41/3244   Complication of anesthesia    Depression    Family history of adverse reaction to anesthesia    mother-- ponv   Family history of breast cancer    Family history of pancreatic cancer    Family history of skin cancer    Gastroenteritis 12/20/2015   GERD (gastroesophageal reflux disease)    Hardware complicating wound infection ([place]) 08/03/2019   Hiatal hernia    History of cancer chemotherapy 02-20-2015 to 03-30-2015   History of cardiac murmur as a child    History of chemotherapy    History of chronic gastritis    History of hypertension    no issues since multiple abdminal sx's and chemo--- no medication since 12/ 2016   History of TMJ disorder    Hypercholesteremia    Hypertension    IBS (irritable bowel syndrome)    dx age 86   Intermittent abdominal pain    post-op multiple abdominal sx's    Microcytic anemia    Mild sleep apnea    per pt study 2014  very mild osa , no cpap recommended, recommeded wt loss and sleep routine   OA (osteoarthritis)    left knee /  left shoulder   Obesity    Parastomal hernia s/p lap repair w mesh 07/23/2018 07/23/2018   Pelvic abscess s/p drainage & omental pedicle flap 11/17/2015 06/23/2015   PONV (postoperative nausea and vomiting)    severe" needs Scopolamine PATCH"    Portacath in place    right chest   Rectal adenocarcinoma ([place]) oncologis-  dr Burr Medico-- after radiation/ chemo (ypT1, N0) --  no recurrence per last note 03/ 2018   dx 01-13-2015-- Stage IIIC (T3, N2, M0) post concurrent radiation and chemotherapy 02-20-2015 to 03-30-2015 /  05-25-2015 s/p  LAR w/ RSO (post-op complicated by late abscess and contained anatomotic leak w/ help percutaneous drainage and antibiotics)    Rectocutaneous fistula 07/10/2016   Rotator cuff tear, left    S/P radiation therapy 02/20/15-03/30/15   colon/rectal   Vitamin D deficiency    Wears glasses    Wears  glasses     Past Surgical History:  Procedure Laterality Date   ABDOMINAL HYSTERECTOMY  1996   uterus and cervix   BACK SURGERY  02/12/2018   lumbar surgery   COLON RESECTION N/A 07/12/2016   Procedure: LAPAROSCOPIC LYSIS OF ADHESIONS, OMENTOPEXY, HAND ASSISTED RESECTION OF  COLON, END TO END ANASTOMOSIS, COLOSTOMY;  Surgeon: Michael Boston, MD;  Location: [facility];  Service: General;  Laterality: N/A;   COMBINED HYSTEROSCOPY DIAGNOSTIC / D&C  x2 1990's   DIAGNOSTIC LAPAROSCOPY  age 14 and age 58   EUS N/A 01/18/2015   Procedure: LOWER ENDOSCOPIC ULTRASOUND (EUS);  Surgeon: Arta Silence, MD;  Location: [facility];  Service: Endoscopy;  Laterality: N/A;   EXCISION OF SKIN TAG  11/17/2015   Procedure: EXCISION OF SKIN TAG;  Surgeon: Michael Boston, MD;  [facility]: [facility];  Service: General;;   ILEO LOOP COLOSTOMY CLOSURE N/A 11/17/2015   Procedure: LAPAROSCOPIC DIVERTING LOOP ILEOSTOMY  DRAINAGE OF PELVIC ABSCESS;  Surgeon: Michael Boston, MD;  [facility];  Service: General;  Laterality: N/A;   ILEOSTOMY CLOSURE N/A 05/31/2016   Procedure: TAKEDOWN LOOP ILEOSTOMY;  Surgeon: Michael Boston, MD;  [facility];  Service: General;  Laterality: N/A;   IMPACTION REMOVAL  11/17/2015   Procedure: DISIMPACTION REMOVAL;  Surgeon: Michael Boston, MD;  [facility];  Service: General;;   INSERTION OF MESH N/A 07/23/2018   Procedure: INSERTION OF MESH X2;  Surgeon: Michael Boston, MD;  [facility];  Service: General;  Laterality: N/A;   IR LUMBAR [place] W/IMG GUIDE  07/01/2019   KNEE ARTHROSCOPY Left 1990's   KNEE ARTHROSCOPY W/ MENISCECTOMY Left 09/14/2009   and chondroplasty debridement   LAPAROSCOPIC LYSIS OF ADHESIONS  11/17/2015   Procedure: LAPAROSCOPIC LYSIS OF ADHESIONS;  Surgeon: Michael Boston, MD;  [facility];  Service: General;;   LUMBAR WOUND DEBRIDEMENT N/A 04/24/2018   Procedure: wound exploration, irrigation and debridement;  Surgeon: Eustace Moore, MD;  Location: [place];   Service: Neurosurgery;  Laterality: N/A;  wound exploration, irrigation and debridement   LYSIS OF ADHESION N/A 07/23/2018   Procedure: LYSIS OF ADHESIONS;  Surgeon: Michael Boston, MD;  [facility];  Service: General;  Laterality: N/A;   PORT-A-CATH REMOVAL N/A 11/21/2016   Procedure: REMOVAL PORT-A-CATH;  Surgeon: Michael Boston, MD;  Location: [place];  Service: General;  Laterality: N/A;   PORTACATH PLACEMENT N/A 05/25/2015   Procedure: INSERTION PORT-A-CATH;  Surgeon: Michael Boston, MD;  [facility];  Service: General;  Laterality: N/A;-remains inplace Right chest.   ROTATOR CUFF REPAIR Right 2006   TONSILLECTOMY  age 29   [place] N/A 07/23/2018   Procedure: [place];  Surgeon: Michael Boston, MD;  [facility];  Service: General;  Laterality: N/A;   XI ROBOTIC ASSISTED LOWER ANTERIOR RESECTION N/A 05/25/2015   Procedure: XI ROBOTIC ASSISTED LOWER ANTERIOR RESECTION, , RIGID PROCTOSCOPY, RIGHT OOPHORECTOMY;  Surgeon: Michael Boston, MD;  [facility];  Service: General;  Laterality: N/A;    Family History  Problem Relation Age of Onset   Coronary artery disease Mother 31   Hypertension Mother    Hyperlipidemia Mother    Diabetes Mellitus I Mother    Coronary artery disease Father    Hyperlipidemia Father    Hypertension Father    Cancer Sister        skin - non melanoma   Hyperlipidemia Brother    Cancer Maternal Uncle 73       pancreatic with mets to colon and prostate   Cirrhosis Maternal Uncle    Hypertension Maternal Grandmother    Diabetes Mellitus I Maternal Grandmother    Hyperlipidemia Maternal Grandmother    CVA Maternal Grandmother    Hypertension Maternal Grandfather    Coronary artery disease Maternal Grandfather 34   Hyperlipidemia Maternal Grandfather    Coronary artery disease Paternal Grandmother    Hypertension Paternal Grandmother    Hyperlipidemia Paternal Grandmother    Diabetes  Mellitus I Paternal Grandmother    Hypertension Paternal Grandfather    Hyperlipidemia Paternal Grandfather    Coronary artery disease Paternal Grandfather     Social History:  reports that she quit smoking about 31 years ago. Her smoking use included cigarettes. She has a 1.75 pack-year smoking history. She has never used smokeless tobacco. She reports that she does not drink alcohol and does not use drugs.  Allergies:  Allergies  Allergen Reactions   Caine-1 [Lidocaine] Swelling and Rash    Eyes swell shut; includes all caine drugs except marcaine. EMLA cream OK though (?!)   Bupropion Other (See Comments)    Not effective per Pt.    Sulfa Antibiotics Nausea And Vomiting and Rash   Pantoprazole Other (See Comments)    Not effective per Pt.    Adhesive [Tape] Rash and Other (See Comments)    Blisters - can use paper tape   Doxycycline Nausea Only   Flagyl [Metronidazole] Nausea Only   Iron Nausea Only   Metoprolol Nausea Only and Palpitations   Oxycodone Other (See Comments)    NIGHTMARES. (tolerates hydrocodone or tramadol better)   Penicillins Nausea Only and Rash    Has patient had a PCN reaction causing immediate rash, facial/tongue/throat swelling, SOB or lightheadedness with hypotension: no Has patient had a PCN reaction causing severe rash involving mucus membranes or skin necrosis: no Has patient had a PCN reaction that required hospitalization no Has patient had a PCN reaction occurring within the last 10 years: no If all of the above answers are "NO", then may proceed with Cephalosporin use.     Medications: I have reviewed the patient's current medications.  Results for orders placed or performed during the hospital encounter of 07/15/22 (from the past 48 hour(s))  CBG monitoring, ED     Status: Abnormal   Collection Time: 07/15/22  4:01 AM  Result Value Ref Range   Glucose-Capillary 197 (H) 70 - 99 mg/dL    Comment: Glucose reference range applies only to  samples taken after fasting for at least 8 hours.  Comprehensive metabolic panel     Status: Abnormal   Collection Time: 07/15/22  4:20 AM  Result Value Ref Range   Sodium 136 135 - 145 mmol/L   Potassium 3.9 3.5 - 5.1 mmol/L   Chloride 104 98 - 111 mmol/L   CO2 18 (L) 22 - 32 mmol/L   Glucose, Bld 202 (H)  70 - 99 mg/dL    Comment: Glucose reference range applies only to samples taken after fasting for at least 8 hours.   BUN 14 6 - 20 mg/dL   Creatinine, Ser 0.96 0.44 - 1.00 mg/dL   Calcium 10.5 (H) 8.9 - 10.3 mg/dL   Total Protein 9.0 (H) 6.5 - 8.1 g/dL   Albumin 4.1 3.5 - 5.0 g/dL   AST 24 15 - 41 U/L   ALT 16 0 - 44 U/L   Alkaline Phosphatase 75 38 - 126 U/L   Total Bilirubin 0.6 0.3 - 1.2 mg/dL   GFR, Estimated >60 >60 mL/min    Comment: (NOTE) Calculated using the CKD-EPI Creatinine Equation (2021)    Anion gap 14 5 - 15    Comment: Performed at [facility], [place] [address], [place], [place] [address]  Lipase, blood     Status: None   Collection Time: 07/15/22  4:20 AM  Result Value Ref Range   Lipase 30 11 - 51 U/L    Comment: Performed at [facility], [place] [address], [place], [place] [address]  CBC with Diff     Status: Abnormal   Collection Time: 07/15/22  4:20 AM  Result Value Ref Range   WBC 15.4 (H) 4.0 - 10.5 K/uL   RBC 5.53 (H) 3.87 - 5.11 MIL/uL   Hemoglobin 14.0 12.0 - 15.0 g/dL   HCT 45.2 36.0 - 46.0 %   MCV 81.7 80.0 - 100.0 fL   MCH 25.3 (L) 26.0 - 34.0 pg   MCHC 31.0 30.0 - 36.0 g/dL   RDW 15.9 (H) 11.5 - 15.5 %   Platelets 481 (H) 150 - 400 K/uL   nRBC 0.0 0.0 - 0.2 %   Neutrophils Relative % 84 %   Neutro Abs 13.1 (H) 1.7 - 7.7 K/uL   Lymphocytes Relative 10 %   Lymphs Abs 1.5 0.7 - 4.0 K/uL   Monocytes Relative 4 %   Monocytes Absolute 0.6 0.1 - 1.0 K/uL   Eosinophils Relative 0 %   Eosinophils Absolute 0.0 0.0 - 0.5 K/uL   Basophils Relative 1 %   Basophils Absolute 0.1 0.0 - 0.1 K/uL   Immature  Granulocytes 1 %   Abs Immature Granulocytes 0.10 (H) 0.00 - 0.07 K/uL    Comment: Performed at [facility], [place] [address], [place], [place] [address]  Lactic acid, plasma     Status: Abnormal   Collection Time: 07/15/22  4:20 AM  Result Value Ref Range   Lactic Acid, Venous 3.1 (HH) 0.5 - 1.9 mmol/L    Comment: CRITICAL RESULT CALLED TO, READ BACK BY AND VERIFIED WITH LACIVITA,H. 07/15/22 '@0504'$  BY SEEL,M. Performed at [facility], [place] [address] [place],  [address]     CT ABDOMEN PELVIS W CONTRAST  Result Date: 07/15/2022 CLINICAL DATA:  Abdominal pain, nausea and vomiting, with colostomy and history of rectal cancer with resection and recurrence August 2023. EXAM: CT ABDOMEN AND PELVIS WITH CONTRAST TECHNIQUE: Multidetector CT imaging of the abdomen and pelvis was performed using the standard protocol following bolus administration of intravenous contrast. RADIATION DOSE REDUCTION: This exam was performed according to the departmental dose-optimization program which includes automated exposure control, adjustment of the mA and/or kV according to patient size and/or use of iterative reconstruction technique. CONTRAST:  186m OMNIPAQUE IOHEXOL 300 MG/ML  SOLN COMPARISON:  PET-CT 03/06/2022, CT without contrast 02/15/22. FINDINGS: Lower chest: No acute abnormality. Hepatobiliary: Hepatic  steatosis. No mass enhancement. Unremarkable gallbladder and bile ducts. Pancreas: Partially atrophic.  No other abnormality. Spleen: Mildly prominent, 13.6 cm coronal.  No mass. Adrenals/Urinary Tract: No adrenal or renal mass is seen. There is fetal lobation of both kidneys, scattered cortical scarring on the left. Interval bilateral ureteral stent insertions decompression of the renal collecting systems. The bladder is contracted and not well seen but was previously thickened on both prior CTs. Stomach/Bowel: Unremarkable gastric wall. Dilatation of upper to mid  abdominal small bowel primarily due to the left is again noted up to 4.4 cm. Similar findings were noted on 02/15/2022 but the bowel had decompressed as of 03/06/2022. Left lower quadrant transition suspected on axial images 64-71 of series 2 and etiology most likely due to adhesions, with an angulated partially feces filled segment in the area of transition. Remaining small bowel is normal caliber. The large bowel wall is largely contracted. An appendix is not seen. There is a descending colostomy and prior abdominal perineal resection. No bowel pneumatosis is seen. Vascular/Lymphatic: There is mild aortic calcific plaque. Again noted are slightly prominent left common iliac chain nodes up to 1 cm in short axis, at least 1 of which showed productivity There are few bilateral borderline sized inguinal chain nodes. One of these on the left demonstrated PET activity previously, specifically on series 2 axial 98. There is interval enlargement of right lower quadrant mesenteric nodes which could be metastatic. Largest of these is 1.4 cm in short axis on 2:70. No other significant lymph nodes are seen. Reproductive: The uterus is absent. Left adnexal abutting cystic structures up to 2.4 cm are unchanged. Other: Presacral soft tissue thickening extending down to the posterior pelvic floor, strongly PET positive, is again noted. The greatest diameter of this is 8.3 x 6.1 cm on 2:87. It is not significantly changed in overall size. There is an air-fluid collection within this at the level of the coccyx and just above the pelvic floor measuring 4.9 x 1.8 cm. This was also seen previously with scattered air pockets in the soft tissue above this level, unknown if this represents small-bowel engulfed by tumor or whether there could be a small bowel fistula to the area with infectious process, but this is similar in appearance to the previous studies. Musculoskeletal: There is levoscoliosis, degenerative and postsurgical changes  of the lumbar spine with L4-5 posterior rods and pedicle screws and interbody metal hardware, advanced disc collapse at L3-4. No destructive bone lesion is seen. IMPRESSION: 1. Small-bowel obstruction, at least of intermediate-grade, with transition point in the left lower quadrant, most likely due to adhesions. 2. Abdominal perineal resection for rectal carcinoma with left upper abdominal descending colostomy. 3. Presacral soft tissue thickening consistent with recurrent tumor and PET positive, extending down to the posterior pelvic floor, not significantly changed in overall size. 4. There is an air-fluid collection within this at the level of the coccyx and just above the pelvic floor measuring 4.9 x 1.8 cm. This was also seen previously with scattered air pockets in the soft tissue above this level, unknown if this represents small-bowel engulfed by tumor or whether there could be a small bowel fistula to the area with infectious process, but this is similar in appearance to the previous studies. 5. Interval enlargement of right lower quadrant mesenteric nodes which could be metastatic. 6. Interval bilateral ureteral stent placement with decompression of the renal collecting systems. 7. Hepatic steatosis and mild splenomegaly. 8. Aortic atherosclerosis. Aortic Atherosclerosis (ICD10-I70.0). Electronically Signed  By: Telford Nab M.D.   On: 07/15/2022 07:06    Review of Systems  Constitutional:  Positive for appetite change.  HENT: Negative.    Eyes: Negative.   Respiratory: Negative.    Cardiovascular: Negative.   Gastrointestinal:  Positive for abdominal distention, abdominal pain, nausea and vomiting.  Endocrine: Negative.   Genitourinary: Negative.   Musculoskeletal: Negative.   Skin: Negative.   Allergic/Immunologic: Negative.   Neurological: Negative.   Hematological: Negative.   Psychiatric/Behavioral: Negative.      Physical Exam  Blood pressure (!) 142/101, pulse (!) 108,  temperature 98.5 F (36.9 C), temperature source Oral, resp. rate 16, SpO2 99 %.  CONSTITUTIONAL: no acute distress; conversant; no obvious deformities  EYES: Conjunctiva clear and moist; pupils equal bilaterally  NECK: trachea midline; no thyroid nodularity  LUNGS: respiratory effort normal & unlabored; no wheeze; no rales  CV: rate and rhythm regular; no significant murmur; no edema bilat lower extremities  GI: abdomen is soft and obese; bowel sounds are present on auscultation; mild tenderness in the upper abdomen just to the left of midline with guarding; no palpable masses; surgical incisions are well-healed; colostomy is present in the left upper quadrant  MSK: normal range of motion of extremities; no clubbing; no cyanosis  PSYCH: appropriate affect for situation; alert and oriented to person, place, & time  LYMPHATIC: no palpable cervical lymphadenopathy  Assessment/Plan:  Recurrent small bowel obstruction, likely due to adhesions Hx of rectal carcinoma with possible pelvic recurrence HTN Colostomy in place  Patient is seen and evaluated in the emergency department.  It appears that she is also being evaluated for admission by [facility] hospitalist.  I have reviewed her laboratory studies as well as her CT scan and I have discussed the results with the patient.  We discussed management of small bowel obstruction.  Patient does not wish to have a nasogastric tube placed as she was able to avoid that on her previous admission in August.  I would like to start the small bowel protocol study as it may be both diagnostically helpful and therapeutic.  We will order that the Gastrografin be given orally.  General surgery will follow.  Dr. Neysa Bonito will be our hospital physician beginning tomorrow morning and he is well acquainted with this patient.  Armandina Gemma, [facility]: [place] 07/15/2022, 8:29 AM

## 2022-07-15 NOTE — ED Provider Notes (Signed)
[facility] DEPT Provider Note   CSN: [id number] Arrival date & time: 07/15/22  0343     History  Chief Complaint  Patient presents with   Abdominal Pain    ANOKHI SHANNON is a 57 y.o. female.  57 year old female prior medical history detailed below presents for evaluation.  Patient complains of abdominal pain, nausea, vomiting, increased ostomy output for approximately 2 days.  Patient with prior history of  colorectal cancer, status post resection, status post colostomy.  Patient with prior history of small bowel obstruction.  Last admission in August for small bowel obstruction was treated nonsurgically.  Denies fever.  She denies bloody output from ostomy or bloody emesis.  The history is provided by the patient and medical records.       Home Medications Prior to Admission medications   Medication Sig Start Date End Date Taking? Authorizing Provider  acetaminophen (TYLENOL) 500 MG tablet Take 1,000 mg by mouth every 8 (eight) hours as needed for moderate pain.    [provider]  bisoprolol (ZEBETA) 10 MG tablet Take 10 mg by mouth daily. 11/30/19   [provider]  celecoxib (CELEBREX) 100 MG capsule Take 100 mg by mouth 2 (two) times daily. 12/12/20   [provider]  Cholecalciferol (VITAMIN D3) 5000 units CAPS Take 5,000 Units by mouth at bedtime.     [provider]  Coenzyme Q10 (COQ10) 200 MG CAPS Take 200 mg by mouth at bedtime.    [provider]  DULoxetine (CYMBALTA) 60 MG capsule Take 120 mg by mouth daily.    [provider]  esomeprazole (NEXIUM) 40 MG capsule Take 1 capsule (40 mg total) by mouth 2 (two) times daily before a meal. 12/22/15   Michael Boston, MD  furosemide (LASIX) 20 MG tablet Take 20 mg by mouth daily as needed for edema.     [provider]  HYDROmorphone (DILAUDID) 4 MG tablet Take 4 mg by mouth every 6 (six) hours as needed (breakthrough pain).  02/08/22   [provider]  HYDROmorphone (DILAUDID) 4 MG tablet Take 1 tablet (4 mg total) by mouth every 6 (six) hours as needed for pain 07/10/22     HYDROmorphone HCl (EXALGO) 8 MG TB24 Take 1 tablet (8 mg total) by mouth daily. MAX IS ONE TABLET A DAY 06/12/22     levETIRAcetam (KEPPRA) 250 MG tablet Take 250 mg by mouth 2 (two) times daily.    [provider]  levothyroxine (SYNTHROID) 50 MCG tablet Take 50 mcg by mouth every morning. 02/07/22   [provider]  Melatonin 3 MG TABS Take 3-6 mg by mouth at bedtime as needed (sleep).    [provider]  morphine (MS CONTIN) 15 MG 12 hr tablet Take 15 mg by mouth 2 (two) times daily. 01/29/22   [provider]  Multiple Vitamin (MULTIVITAMIN WITH MINERALS) TABS tablet Take 2 tablets by mouth at bedtime.     [provider]  oxybutynin (DITROPAN-XL) 10 MG 24 hr tablet Take 10 mg by mouth daily. 02/12/22   [provider]  polyethylene glycol (MIRALAX / GLYCOLAX) 17 g packet Take 17 g by mouth daily.    [provider]  pregabalin (LYRICA) 225 MG capsule Take 225 mg by mouth 2 (two) times daily. 02/07/22   [provider]  promethazine (PHENERGAN) 25 MG tablet TAKE ONE TABLET BY MOUTH EVERY 6 HOURS AS NEEDED FOR NAUSEA OR VOMITING Patient taking differently: Take 25  mg by mouth every 6 (six) hours as needed for nausea or vomiting. 07/04/20   Tommy Medal, Lavell Islam, MD  rosuvastatin (CRESTOR) 5 MG tablet Take 5 mg by mouth at bedtime.    [provider]  triamcinolone lotion (KENALOG) 0.1 % Apply 1 application topically as needed (dry skin).    [provider]      Allergies    Caine-1 [lidocaine], Bupropion, Sulfa antibiotics, Pantoprazole, Adhesive [tape], Doxycycline, Flagyl [metronidazole], Iron, Metoprolol, Oxycodone, and Penicillins    Review of Systems   Review of Systems  All other systems reviewed and are negative.   Physical Exam Updated  Vital Signs BP (!) 142/101 (BP Location: Left Arm)   Pulse (!) 108   Temp 98.5 F (36.9 C) (Oral)   Resp 16   SpO2 99%  Physical Exam Vitals and nursing note reviewed.  Constitutional:      General: She is not in acute distress.    Appearance: Normal appearance. She is well-developed.  HENT:     Head: Normocephalic and atraumatic.  Eyes:     Conjunctiva/sclera: Conjunctivae normal.     Pupils: Pupils are equal, round, and reactive to light.  Cardiovascular:     Rate and Rhythm: Normal rate and regular rhythm.     Heart sounds: Normal heart sounds.  Pulmonary:     Effort: Pulmonary effort is normal. No respiratory distress.     Breath sounds: Normal breath sounds.  Abdominal:     General: There is no distension.     Palpations: Abdomen is soft.     Tenderness: There is generalized abdominal tenderness.     Comments: Left abdomen ostomy bag in place, mild generalized discomfort with palpation.  No peritoneal findings.  Musculoskeletal:        General: No deformity. Normal range of motion.     Cervical back: Normal range of motion and neck supple.  Skin:    General: Skin is warm and dry.  Neurological:     General: No focal deficit present.     Mental Status: She is alert and oriented to person, place, and time.     ED Results / Procedures / Treatments   Labs (all labs ordered are listed, but only abnormal results are displayed) Labs Reviewed  COMPREHENSIVE METABOLIC PANEL - Abnormal; Notable for the following components:      Result Value   CO2 18 (*)    Glucose, Bld 202 (*)    Calcium 10.5 (*)    Total Protein 9.0 (*)    All other components within normal limits  CBC WITH DIFFERENTIAL/PLATELET - Abnormal; Notable for the following components:   WBC 15.4 (*)    RBC 5.53 (*)    MCH 25.3 (*)    RDW 15.9 (*)    Platelets 481 (*)    Neutro Abs 13.1 (*)    Abs Immature Granulocytes 0.10 (*)    All other components within normal limits  LACTIC ACID, PLASMA -  Abnormal; Notable for the following components:   Lactic Acid, Venous 3.1 (*)    All other components within normal limits  CBG MONITORING, ED - Abnormal; Notable for the following components:   Glucose-Capillary 197 (*)    All other components within normal limits  LIPASE, BLOOD  URINALYSIS, ROUTINE W REFLEX MICROSCOPIC  LACTIC ACID, PLASMA  MAGNESIUM  PHOSPHORUS  I-STAT CHEM 8, ED    EKG None  Radiology CT ABDOMEN PELVIS W CONTRAST  Result Date: 07/15/2022 CLINICAL DATA:  Abdominal pain, nausea and vomiting, with colostomy and history of rectal cancer with resection and recurrence August 2023. EXAM: CT ABDOMEN AND PELVIS WITH CONTRAST TECHNIQUE: Multidetector CT imaging of the abdomen and pelvis was performed using the standard protocol following bolus administration of intravenous contrast. RADIATION DOSE REDUCTION: This exam was performed according to the departmental dose-optimization program which includes automated exposure control, adjustment of the mA and/or kV according to patient size and/or use of iterative reconstruction technique. CONTRAST:  1103m OMNIPAQUE IOHEXOL 300 MG/ML  SOLN COMPARISON:  PET-CT 03/06/2022, CT without contrast 02/15/22. FINDINGS: Lower chest: No acute abnormality. Hepatobiliary: Hepatic steatosis. No mass enhancement. Unremarkable gallbladder and bile ducts. Pancreas: Partially atrophic.  No other abnormality. Spleen: Mildly prominent, 13.6 cm coronal.  No mass. Adrenals/Urinary Tract: No adrenal or renal mass is seen. There is fetal lobation of both kidneys, scattered cortical scarring on the left. Interval bilateral ureteral stent insertions decompression of the renal collecting systems. The bladder is contracted and not well seen but was previously thickened on both prior CTs. Stomach/Bowel: Unremarkable gastric wall. Dilatation of upper to mid abdominal small bowel primarily due to the left is again noted up to 4.4 cm. Similar findings were noted on 02/15/2022  but the bowel had decompressed as of 03/06/2022. Left lower quadrant transition suspected on axial images 64-71 of series 2 and etiology most likely due to adhesions, with an angulated partially feces filled segment in the area of transition. Remaining small bowel is normal caliber. The large bowel wall is largely contracted. An appendix is not seen. There is a descending colostomy and prior abdominal perineal resection. No bowel pneumatosis is seen. Vascular/Lymphatic: There is mild aortic calcific plaque. Again noted are slightly prominent left common iliac chain nodes up to 1 cm in short axis, at least 1 of which showed productivity There are few bilateral borderline sized inguinal chain nodes. One of these on the left demonstrated PET activity previously, specifically on series 2 axial 98. There is interval enlargement of right lower quadrant mesenteric nodes which could be metastatic. Largest of these is 1.4 cm in short axis on 2:70. No other significant lymph nodes are seen. Reproductive: The uterus is absent. Left adnexal abutting cystic structures up to 2.4 cm are unchanged. Other: Presacral soft tissue thickening extending down to the posterior pelvic floor, strongly PET positive, is again noted. The greatest diameter of this is 8.3 x 6.1 cm on 2:87. It is not significantly changed in overall size. There is an air-fluid collection within this at the level of the coccyx and just above the pelvic floor measuring 4.9 x 1.8 cm. This was also seen previously with scattered air pockets in the soft tissue above this level, unknown if this represents small-bowel engulfed by tumor or whether there could be a small bowel fistula to the area with infectious process, but this is similar in appearance to the previous studies. Musculoskeletal: There is levoscoliosis, degenerative and postsurgical changes of the lumbar spine with L4-5 posterior rods and pedicle screws and interbody metal hardware, advanced disc collapse  at L3-4. No destructive bone lesion is seen. IMPRESSION: 1. Small-bowel obstruction, at least of intermediate-grade, with transition point in the left lower quadrant, most likely due to adhesions. 2. Abdominal perineal resection for rectal carcinoma with left upper abdominal descending colostomy. 3. Presacral soft tissue thickening consistent with recurrent tumor and PET positive, extending down to the posterior pelvic floor, not significantly changed in overall size. 4. There is an air-fluid collection within this at  the level of the coccyx and just above the pelvic floor measuring 4.9 x 1.8 cm. This was also seen previously with scattered air pockets in the soft tissue above this level, unknown if this represents small-bowel engulfed by tumor or whether there could be a small bowel fistula to the area with infectious process, but this is similar in appearance to the previous studies. 5. Interval enlargement of right lower quadrant mesenteric nodes which could be metastatic. 6. Interval bilateral ureteral stent placement with decompression of the renal collecting systems. 7. Hepatic steatosis and mild splenomegaly. 8. Aortic atherosclerosis. Aortic Atherosclerosis (ICD10-I70.0). Electronically Signed   By: Telford Nab M.D.   On: 07/15/2022 07:06    Procedures Procedures    Medications Ordered in ED Medications  promethazine (PHENERGAN) 12.5 mg in sodium chloride 0.9 % 50 mL IVPB (12.5 mg Intravenous New Bag/Given 07/15/22 0827)  sodium chloride 0.9 % bolus 1,000 mL (has no administration in time range)  enoxaparin (LOVENOX) injection 40 mg (has no administration in time range)  acetaminophen (TYLENOL) tablet 650 mg (has no administration in time range)    Or  acetaminophen (TYLENOL) suppository 650 mg (has no administration in time range)  ondansetron (ZOFRAN) tablet 4 mg (has no administration in time range)    Or  ondansetron (ZOFRAN) injection 4 mg (has no administration in time range)   HYDROmorphone (DILAUDID) injection 1 mg (has no administration in time range)  metoCLOPramide (REGLAN) injection 10 mg (10 mg Intravenous Given 07/15/22 0431)  sodium chloride 0.9 % bolus 1,000 mL (0 mLs Intravenous Stopped 07/15/22 0600)  sodium chloride (PF) 0.9 % injection (  Given by Other 07/15/22 0530)  iohexol (OMNIPAQUE) 300 MG/ML solution 100 mL (100 mLs Intravenous Contrast Given 07/15/22 0559)  0.9 %  sodium chloride infusion ( Intravenous New Bag/Given 07/15/22 0754)  HYDROmorphone (DILAUDID) injection 0.5 mg (0.5 mg Intravenous Given 07/15/22 0754)  ondansetron (ZOFRAN) injection 4 mg (4 mg Intravenous Given 07/15/22 0754)    ED Course/ Medical Decision Making/ A&P                           Medical Decision Making Amount and/or Complexity of Data Reviewed Labs: ordered.  Risk Prescription drug management. Decision regarding hospitalization.    Medical Screen Complete  This patient presented to the ED with complaint of abdominal pain.  This complaint involves an extensive number of treatment options. The initial differential diagnosis includes, but is not limited to, intra-abdominal pathology such as obstruction, infection, etc.  This presentation is: Acute, Chronic, Self-Limited, Previously Undiagnosed, Uncertain Prognosis, Complicated, Systemic Symptoms, and Threat to Life/Bodily Function  Patient with history of colorectal cancer s/p resection status post colostomy with prior history of small bowel obstruction presents with abdominal pain and associated nausea vomiting.  Presentation is concerning for bowel obstruction.  CT imaging reveals evidence of small bowel obstruction.    IV fluids, antiemetics, and IV pain medications administered here in the ED.  Dr. Donne Hazel with general surgery aware of case and will arrange surgical consultation.  Dr. Olevia Bowens with hospitalist service is aware case will evaluate for admission.  Additional history obtained: External records  from outside sources obtained and reviewed including prior ED visits and prior Inpatient records.    Lab Tests:  I ordered and personally interpreted labs.  The pertinent results include: CBC, CMP, lactic acid, lipase   Imaging Studies ordered:  I ordered imaging studies including CT abdomen pelvis I independently visualized  and interpreted obtained imaging which showed small bowel obstruction I agree with the radiologist interpretation.   Cardiac Monitoring:  The patient was maintained on a cardiac monitor.  I personally viewed and interpreted the cardiac monitor which showed an underlying rhythm of: NSR   Medicines ordered:  I ordered medication including Dilaudid, Zofran, Phenergan, IV fluids for bowel obstruction Reevaluation of the patient after these medicines showed that the patient: improved  Problem List / ED Course:  Mall bowel obstruction   Reevaluation:  After the interventions noted above, I reevaluated the patient and found that they have: improved   Disposition:  After consideration of the diagnostic results and the patients response to treatment, I feel that the patent would benefit from admission.          Final Clinical Impression(s) / ED Diagnoses Final diagnoses:  Small bowel obstruction [facility])    Rx / DC Orders ED Discharge Orders     None         Valarie Merino, MD 07/15/22 [phone]

## 2022-07-15 NOTE — ED Notes (Signed)
Date and time results received: 07/15/22 5:04 AM  (use smartphrase ".now" to insert current time)  Test: Lactic acid  Critical Value: 3.1  Name of Provider Notified: [facility]   Orders Received? Or Actions Taken?: Orders Received - See Orders for details

## 2022-07-15 NOTE — ED Provider Notes (Signed)
MSE was initiated and I personally evaluated the patient and placed orders (if any) at  4:18 AM on July 15, 2022.  HPI:  Lindsay Little is a 56 y.o. female presents to the emergency department with complaints of generalized abd pain, N/V with increased ostomy output for 2 days. Not able to control N/V with home antiemetics  ROS:  Positive: none Negative: fever, chills  PE:  BP (!) 152/102 (BP Location: Right Arm)   Pulse (!) 129   Temp 98.7 F (37.1 C) (Oral)   Resp 18   SpO2 97%   General: Awake, no distress  HEENT: Atraumatic  Resp: Normal effort  Cardiac: normal rate Abd: Nondistended, generalized discomfort w/o focal peritonitis. Left abd ostomy bag MSK: moves all extremities without difficulty Neuro: speech clear   MDM: MSE completed and orders placed as needed.    Discussed with the patient that exiting the department prior to completion of the work-up is AMA and there is no guarantee that there are no emergency medical conditions present. Pt states understanding.   The patient appears stable so that the remainder of the MSE may be completed by another provider.   Fatima Blank, MD 07/15/22 [phone]

## 2022-07-15 NOTE — H&P (Signed)
History and Physical    Patient: Lindsay Little [id number]:[id number] DOB: 1965/11/27 DOA: 07/15/2022 DOS: the patient was seen and examined on 07/15/2022 PCP: [name], MD  Patient coming from: Home  Chief Complaint:  Chief Complaint  Patient presents with   Abdominal Pain   HPI: Lindsay Little is a 57 y.o. female with medical history significant of Rectal cancer with history of hypertension, hyperlipidemia, osteomyelitis, lumbar discitis, chronic pain syndrome, class III obesity, GERD, mixed urinary incontinence, overactive bladder, IBS, hypophosphatemia, peripheral neuropathy due to chemotherapy, hydroureter, bilateral hydronephrosis, cavernous hemangioma of the liver, anemia chronic disease, chron's disease rectal cancer with history of resection/colostomy complicated by rectovaginal fistula and intra-abdominal abscesses, multiple episodes of SBO in the past was coming to the emergency department due to abdominal pain, nausea, numerous episodes of emesis and 2 episodes of loose stools followed by decreased output from ostomy for the past 2 days.She denied fever, chills, rhinorrhea, sore throat, wheezing or hemoptysis.  No chest pain, palpitations, diaphoresis, PND, orthopnea or pitting edema of the lower extremities.  No constipation, melena or hematochezia.  No flank pain, dysuria, frequency or hematuria.  No polyuria, polydipsia, polyphagia or blurred vision.   ED course: Initial vital signs were temperature 98.7 F, pulse 129, respiration 18, BP 152/102 mmHg O2 sat 97% on room air.  The patient received hydromorphone 0.5 mg IVP, ondansetron 4 mg IVP x 1, metoclopramide 10 mg IVP, promethazine 12.5 mg IVPB and 1000 mL of NS bolus.  I added 2000 mL of NS bolus, magnesium sulfate 2 g IVPB and prochlorperazine 10 mg IVP.  Lab work: CBC showed a white count of 15.4 with 84% neutrophils, hemoglobin 14.0 g deciliter platelets 481.  Lactic acid is 3.1, then 3.1 again then 1.4 mmol/L.  Magnesium is  1.7 and phosphorus less than 1.0 mg/dL.  CMP showed a CO2 of 18 mmol/L, glucose of 202 and calcium 10.5 mg/dL.  Total protein 9.0 g deciliter.  The rest of the CMP was normal.  Imaging: CT abdomen/pelvis with contrast shows small bowel obstruction, least intermediate grade with transition point in the left lower quadrant most likely due to ideations.  Abdominal perineal resection for rectal carcinoma with left upper abdominal descending colostomy.  Presacral soft tissue thickening consistent with recurrent doorman and PET positive, extending down to the posterior pelvic floor not significantly changed in overall size.  There is an air-fluid level collection within this at the level of the coccyx and just above the pelvic floor measuring 4.9 x 1.8 cm.  This was also seen previously with scattered air pockets in the soft tissues above this level, unknown if this represents small bowel involved by tumor or whether there could be a small bowel fistula to the area with infectious process.  There is interval enlargement of the right lower quadrant mesenteric nodes which could be metastatic.  Interval bilateral ureteral stent placement with a compression of the renal collecting system.  Hepatic asteatosis and mild splenomegaly.  Aortic atherosclerosis.  Please see images and full radiology report for further details.   Review of Systems: As mentioned in the history of present illness. All other systems reviewed and are negative. Past Medical History:  Diagnosis Date   Anxiety    Chemotherapy-induced neuropathy (HCC)    toes and fingers numbness and tingling   Colorectal delayed anastomotic leak s/p resection & colostomy 07/12/2016 11/17/2015   Colostomy in place [facility]) 07/12/2016   due to anastomosis breakdown w/ colovaginal fistula   Colovaginal fistula  s/p omentopexy repair 07/12/2016 14/48/1856   Complication of anesthesia    Depression    Family history of adverse reaction to anesthesia    mother-- ponv    Family history of breast cancer    Family history of pancreatic cancer    Family history of skin cancer    Gastroenteritis 12/20/2015   GERD (gastroesophageal reflux disease)    Hardware complicating wound infection ([place]) 08/03/2019   Hiatal hernia    History of cancer chemotherapy 02-20-2015 to 03-30-2015   History of cardiac murmur as a child    History of chemotherapy    History of chronic gastritis    History of hypertension    no issues since multiple abdminal sx's and chemo--- no medication since 12/ 2016   History of TMJ disorder    Hypercholesteremia    Hypertension    IBS (irritable bowel syndrome)    dx age 32   Intermittent abdominal pain    post-op multiple abdominal sx's    Microcytic anemia    Mild sleep apnea    per pt study 2014  very mild osa , no cpap recommended, recommeded wt loss and sleep routine   OA (osteoarthritis)    left knee /  left shoulder   Obesity    Parastomal hernia s/p lap repair w mesh 07/23/2018 07/23/2018   Pelvic abscess s/p drainage & omental pedicle flap 11/17/2015 06/23/2015   PONV (postoperative nausea and vomiting)    severe" needs Scopolamine PATCH"    Portacath in place    right chest   Rectal adenocarcinoma ([place]) oncologis-  dr Burr Medico-- after radiation/ chemo (ypT1, N0) --  no recurrence per last note 03/ 2018   dx 01-13-2015-- Stage IIIC (T3, N2, M0) post concurrent radiation and chemotherapy 02-20-2015 to 03-30-2015 /  05-25-2015 s/p  LAR w/ RSO (post-op complicated by late abscess and contained anatomotic leak w/ help percutaneous drainage and antibiotics)    Rectocutaneous fistula 07/10/2016   Rotator cuff tear, left    S/P radiation therapy 02/20/15-03/30/15   colon/rectal   Vitamin D deficiency    Wears glasses    Wears glasses    Past Surgical History:  Procedure Laterality Date   ABDOMINAL HYSTERECTOMY  1996   uterus and cervix   BACK SURGERY  02/12/2018   lumbar surgery   COLON RESECTION N/A 07/12/2016   Procedure:  LAPAROSCOPIC LYSIS OF ADHESIONS, OMENTOPEXY, HAND ASSISTED RESECTION OF  COLON, END TO END ANASTOMOSIS, COLOSTOMY;  Surgeon: Michael Boston, MD;  Location: [facility];  Service: General;  Laterality: N/A;   COMBINED HYSTEROSCOPY DIAGNOSTIC / D&C  x2 1990's   DIAGNOSTIC LAPAROSCOPY  age 32 and age 74   EUS N/A 01/18/2015   Procedure: LOWER ENDOSCOPIC ULTRASOUND (EUS);  Surgeon: Arta Silence, MD;  Location: [facility];  Service: Endoscopy;  Laterality: N/A;   EXCISION OF SKIN TAG  11/17/2015   Procedure: EXCISION OF SKIN TAG;  Surgeon: Michael Boston, MD;  Location: [facility];  Service: General;;   ILEO LOOP COLOSTOMY CLOSURE N/A 11/17/2015   Procedure: LAPAROSCOPIC DIVERTING LOOP ILEOSTOMY  DRAINAGE OF PELVIC ABSCESS;  Surgeon: Michael Boston, MD;  Location: [facility];  Service: General;  Laterality: N/A;   ILEOSTOMY CLOSURE N/A 05/31/2016   Procedure: TAKEDOWN LOOP ILEOSTOMY;  Surgeon: Michael Boston, MD;  Location: [facility];  Service: General;  Laterality: N/A;   IMPACTION REMOVAL  11/17/2015   Procedure: DISIMPACTION REMOVAL;  Surgeon: Michael Boston, MD;  Location: [facility];  Service: General;;  INSERTION OF MESH N/A 07/23/2018   Procedure: INSERTION OF MESH X2;  Surgeon: Michael Boston, MD;  Location: [facility];  Service: General;  Laterality: N/A;   IR LUMBAR [place] W/IMG GUIDE  07/01/2019   KNEE ARTHROSCOPY Left 1990's   KNEE ARTHROSCOPY W/ MENISCECTOMY Left 09/14/2009   and chondroplasty debridement   LAPAROSCOPIC LYSIS OF ADHESIONS  11/17/2015   Procedure: LAPAROSCOPIC LYSIS OF ADHESIONS;  Surgeon: Michael Boston, MD;  Location: [facility];  Service: General;;   LUMBAR WOUND DEBRIDEMENT N/A 04/24/2018   Procedure: wound exploration, irrigation and debridement;  Surgeon: Eustace Moore, MD;  Location: [facility];  Service: Neurosurgery;  Laterality: N/A;  wound exploration, irrigation and debridement   LYSIS OF ADHESION N/A 07/23/2018   Procedure: LYSIS OF ADHESIONS;  Surgeon: Michael Boston, MD;  [facility];  Service:  General;  Laterality: N/A;   PORT-A-CATH REMOVAL N/A 11/21/2016   Procedure: REMOVAL PORT-A-CATH;  Surgeon: Michael Boston, MD;  Location: [place];  Service: General;  Laterality: N/A;   PORTACATH PLACEMENT N/A 05/25/2015   Procedure: INSERTION PORT-A-CATH;  Surgeon: Michael Boston, MD;  Location: [facility];  Service: General;  Laterality: N/A;-remains inplace Right chest.   ROTATOR CUFF REPAIR Right 2006   TONSILLECTOMY  age 23   [place] N/A 07/23/2018   Procedure: [place];  Surgeon: Michael Boston, MD;  Location: [facility];  Service: General;  Laterality: N/A;   XI ROBOTIC ASSISTED LOWER ANTERIOR RESECTION N/A 05/25/2015   Procedure: XI ROBOTIC ASSISTED LOWER ANTERIOR RESECTION, , RIGID PROCTOSCOPY, RIGHT OOPHORECTOMY;  Surgeon: Michael Boston, MD;  Location[facility];  Service: General;  Laterality: N/A;   Social History:  reports that she quit smoking about 31 years ago. Her smoking use included cigarettes. She has a 1.75 pack-year smoking history. She has never used smokeless tobacco. She reports that she does not drink alcohol and does not use drugs.  Allergies  Allergen Reactions   Caine-1 [Lidocaine] Swelling and Rash    Eyes swell shut; includes all caine drugs except marcaine. EMLA cream OK though (?!)   Bupropion Other (See Comments)    Not effective per Pt.    Sulfa Antibiotics Nausea And Vomiting and Rash   Pantoprazole Other (See Comments)    Not effective per Pt.    Adhesive [Tape] Rash and Other (See Comments)    Blisters - can use paper tape   Doxycycline Nausea Only   Flagyl [Metronidazole] Nausea Only   Iron Nausea Only   Metoprolol Nausea Only and Palpitations   Oxycodone Other (See Comments)    NIGHTMARES. (tolerates hydrocodone or tramadol better)   Penicillins Nausea Only and Rash    Has patient had a PCN reaction causing immediate rash, facial/tongue/throat swelling, SOB or lightheadedness with hypotension:  no Has patient had a PCN reaction causing severe rash involving mucus membranes or skin necrosis: no Has patient had a PCN reaction that required hospitalization no Has patient had a PCN reaction occurring within the last 10 years: no If all of the above answers are "NO", then may proceed with Cephalosporin use.     Family History  Problem Relation Age of Onset   Coronary artery disease Mother 105   Hypertension Mother    Hyperlipidemia Mother    Diabetes Mellitus I Mother    Coronary artery disease Father    Hyperlipidemia Father    Hypertension Father    Cancer Sister  skin - non melanoma   Hyperlipidemia Brother    Cancer Maternal Uncle 65       pancreatic with mets to colon and prostate   Cirrhosis Maternal Uncle    Hypertension Maternal Grandmother    Diabetes Mellitus I Maternal Grandmother    Hyperlipidemia Maternal Grandmother    CVA Maternal Grandmother    Hypertension Maternal Grandfather    Coronary artery disease Maternal Grandfather 64   Hyperlipidemia Maternal Grandfather    Coronary artery disease Paternal Grandmother    Hypertension Paternal Grandmother    Hyperlipidemia Paternal Grandmother    Diabetes Mellitus I Paternal Grandmother    Hypertension Paternal Grandfather    Hyperlipidemia Paternal Grandfather    Coronary artery disease Paternal Grandfather     Prior to Admission medications   Medication Sig Start Date End Date Taking? Authorizing Provider  acetaminophen (TYLENOL) 500 MG tablet Take 1,000 mg by mouth every 8 (eight) hours as needed for moderate pain.    [provider]  bisoprolol (ZEBETA) 10 MG tablet Take 10 mg by mouth daily. 11/30/19   [provider]  celecoxib (CELEBREX) 100 MG capsule Take 100 mg by mouth 2 (two) times daily. 12/12/20   [provider]  Cholecalciferol (VITAMIN D3) 5000 units CAPS Take 5,000 Units by mouth at bedtime.     [provider]  Coenzyme Q10 (COQ10) 200 MG CAPS Take  200 mg by mouth at bedtime.    [provider]  DULoxetine (CYMBALTA) 60 MG capsule Take 120 mg by mouth daily.    [provider]  esomeprazole (NEXIUM) 40 MG capsule Take 1 capsule (40 mg total) by mouth 2 (two) times daily before a meal. 12/22/15   Michael Boston, MD  furosemide (LASIX) 20 MG tablet Take 20 mg by mouth daily as needed for edema.     [provider]  HYDROmorphone (DILAUDID) 4 MG tablet Take 4 mg by mouth every 6 (six) hours as needed (breakthrough pain). 02/08/22   [provider]  HYDROmorphone (DILAUDID) 4 MG tablet Take 1 tablet (4 mg total) by mouth every 6 (six) hours as needed for pain 07/10/22     HYDROmorphone HCl (EXALGO) 8 MG TB24 Take 1 tablet (8 mg total) by mouth daily. MAX IS ONE TABLET A DAY 06/12/22     levETIRAcetam (KEPPRA) 250 MG tablet Take 250 mg by mouth 2 (two) times daily.    [provider]  levothyroxine (SYNTHROID) 50 MCG tablet Take 50 mcg by mouth every morning. 02/07/22   [provider]  Melatonin 3 MG TABS Take 3-6 mg by mouth at bedtime as needed (sleep).    [provider]  morphine (MS CONTIN) 15 MG 12 hr tablet Take 15 mg by mouth 2 (two) times daily. 01/29/22   [provider]  Multiple Vitamin (MULTIVITAMIN WITH MINERALS) TABS tablet Take 2 tablets by mouth at bedtime.     [provider]  oxybutynin (DITROPAN-XL) 10 MG 24 hr tablet Take 10 mg by mouth daily. 02/12/22   [provider]  polyethylene glycol (MIRALAX / GLYCOLAX) 17 g packet Take 17 g by mouth daily.    [provider]  pregabalin (LYRICA) 225 MG capsule Take 225 mg by mouth 2 (two) times daily. 02/07/22   [provider]  promethazine (PHENERGAN) 25 MG tablet TAKE ONE TABLET BY MOUTH EVERY 6 HOURS AS NEEDED FOR NAUSEA OR VOMITING Patient taking differently: Take 25 mg by mouth every 6 (six) hours as  needed for nausea or vomiting. 07/04/20   Tommy Medal, Lavell Islam, MD  rosuvastatin  (CRESTOR) 5 MG tablet Take 5 mg by mouth at bedtime.    [provider]  triamcinolone lotion (KENALOG) 0.1 % Apply 1 application topically as needed (dry skin).    [provider]    Physical Exam: Vitals:   07/15/22 0358 07/15/22 0400 07/15/22 0730  BP:  (!) 152/102 (!) 142/101  Pulse:  (!) 129 (!) 108  Resp:  18 16  Temp:  98.7 F (37.1 C) 98.5 F (36.9 C)  TempSrc:  Oral Oral  SpO2: 98% 97% 99%   Physical Exam Vitals and nursing note reviewed.  Constitutional:      General: She is awake. She is not in acute distress.    Appearance: She is morbidly obese.  HENT:     Head: Normocephalic.     Nose: No rhinorrhea.     Mouth/Throat:     Mouth: Mucous membranes are dry.  Eyes:     General: No scleral icterus.    Pupils: Pupils are equal, round, and reactive to light.  Neck:     Vascular: No JVD.  Cardiovascular:     Rate and Rhythm: Normal rate and regular rhythm.     Heart sounds: S1 normal and S2 normal.  Pulmonary:     Effort: Pulmonary effort is normal.     Breath sounds: Normal breath sounds.  Abdominal:     General: Abdomen is protuberant. The ostomy site is clean. Bowel sounds are normal.     Palpations: Abdomen is soft.     Tenderness: There is abdominal tenderness in the epigastric area. There is no right CVA tenderness or left CVA tenderness.  Musculoskeletal:     Cervical back: Neck supple.     Right lower leg: No edema.     Left lower leg: No edema.  Skin:    General: Skin is warm and dry.  Neurological:     General: No focal deficit present.     Mental Status: She is alert and oriented to person, place, and time.  Psychiatric:        Mood and Affect: Mood normal.        Behavior: Behavior normal. Behavior is cooperative.     Data Reviewed:  Results are pending, will review when available.  Assessment and Plan: Principal Problem:   Small bowel obstruction ([place]) Observation/MedSurg. Keep NPO. Declined NGT. Continue IV  fluids. Analgesics as needed. Antiemetics as needed. Pantoprazole 40 mg IVP every 24 hours. Keep electrolytes optimized. Follow-up CBC and CMP in AM. Follow-up imaging in the morning. General surgery input appreciated.  Active Problems:   Rectal adenocarcinoma s/p LAR resection 05/25/2015 Questionable disease recurrence. Follow-up with oncology as an outpatient.    Colostomy in place [facility]) Showing resewn output now. Continue local care.    Pure hypercholesterolemia Resume rosuvastatin once on diet.    Hypertension Parenteral antihypertensives as needed. Monitor blood pressure and heart rate.    Hypophosphatemia Replaced. Follow level in AM.    Hypothyroidism Resume levothyroxine once cleared for oral intake.    GERD (gastroesophageal reflux disease) Pantoprazole 40 mg IVP every 24 hours.    Class 3 obesity Follow-up with PCP.    Advance Care Planning:   Code Status: Full Code   Consults: General surgery (Dr. Armandina Gemma MD).  Family Communication:   Severity of Illness: The appropriate patient status for this patient is INPATIENT. Inpatient status is judged to be  reasonable and necessary in order to provide the required intensity of service to ensure the patient's safety. The patient's presenting symptoms, physical exam findings, and initial radiographic and laboratory data in the context of their chronic comorbidities is felt to place them at high risk for further clinical deterioration. Furthermore, it is not anticipated that the patient will be medically stable for discharge from the hospital within 2 midnights of admission.   * I certify that at the point of admission it is my clinical judgment that the patient will require inpatient hospital care spanning beyond 2 midnights from the point of admission due to high intensity of service, high risk for further deterioration and high frequency of surveillance required.*  Author: Reubin Milan, MD 07/15/2022 8:30  AM  For on call review [email or web address].   This document was prepared using Dragon voice recognition software and may contain some unintended transcription errors.

## 2022-07-16 ENCOUNTER — Inpatient Hospital Stay (HOSPITAL_COMMUNITY): Payer: Medicare HMO

## 2022-07-16 DIAGNOSIS — K56609 Unspecified intestinal obstruction, unspecified as to partial versus complete obstruction: Secondary | ICD-10-CM | POA: Diagnosis not present

## 2022-07-16 LAB — COMPREHENSIVE METABOLIC PANEL
ALT: 12 U/L (ref 0–44)
AST: 13 U/L — ABNORMAL LOW (ref 15–41)
Albumin: 3.1 g/dL — ABNORMAL LOW (ref 3.5–5.0)
Alkaline Phosphatase: 58 U/L (ref 38–126)
Anion gap: 8 (ref 5–15)
BUN: 10 mg/dL (ref 6–20)
CO2: 19 mmol/L — ABNORMAL LOW (ref 22–32)
Calcium: 8.5 mg/dL — ABNORMAL LOW (ref 8.9–10.3)
Chloride: 110 mmol/L (ref 98–111)
Creatinine, Ser: 0.69 mg/dL (ref 0.44–1.00)
GFR, Estimated: >60 mL/min (ref >60)
Glucose, Bld: 114 mg/dL — ABNORMAL HIGH (ref 70–99)
Potassium: 3.9 mmol/L (ref 3.5–5.1)
Sodium: 137 mmol/L (ref 135–145)
Total Bilirubin: 0.5 mg/dL (ref 0.3–1.2)
Total Protein: 7.2 g/dL (ref 6.5–8.1)

## 2022-07-16 LAB — CBC
HCT: 36.3 % (ref 36.0–46.0)
Hemoglobin: 10.6 g/dL — ABNORMAL LOW (ref 12.0–15.0)
MCH: 25.8 pg — ABNORMAL LOW (ref 26.0–34.0)
MCHC: 29.2 g/dL — ABNORMAL LOW (ref 30.0–36.0)
MCV: 88.3 fL (ref 80.0–100.0)
Platelets: 200 10*3/uL (ref 150–400)
RBC: 4.11 MIL/uL (ref 3.87–5.11)
RDW: 16.4 % — ABNORMAL HIGH (ref 11.5–15.5)
WBC: 10 10*3/uL (ref 4.0–10.5)
nRBC: 0 % (ref 0.0–0.2)

## 2022-07-16 LAB — GLUCOSE, CAPILLARY: Glucose-Capillary: 81 mg/dL (ref 70–99)

## 2022-07-16 LAB — PHOSPHORUS: Phosphorus: 2.6 mg/dL (ref 2.5–4.6)

## 2022-07-16 MED ORDER — HYDRALAZINE HCL 20 MG/ML IJ SOLN: 10.0000 mg | INTRAMUSCULAR | Status: DC | PRN

## 2022-07-16 MED ORDER — PROCHLORPERAZINE EDISYLATE 10 MG/2ML IJ SOLN: 10.0000 mg | Freq: Four times a day (QID) | INTRAMUSCULAR | Status: DC | PRN

## 2022-07-16 NOTE — Progress Notes (Signed)
  Progress Note   Patient: Lindsay Little VQQ:[id number] DOB: 1966-04-16 DOA: 07/15/2022     1 DOS: the patient was seen and examined on 07/16/2022   Brief hospital course: 57 y.o. female with medical history significant of Rectal cancer with history of hypertension, hyperlipidemia, osteomyelitis, lumbar discitis, chronic pain syndrome, class III obesity, GERD, mixed urinary incontinence, overactive bladder, IBS, hypophosphatemia, peripheral neuropathy due to chemotherapy, hydroureter, bilateral hydronephrosis, cavernous hemangioma of the liver, anemia chronic disease, chron's disease rectal cancer with history of resection/colostomy complicated by rectovaginal fistula and intra-abdominal abscesses, multiple episodes of SBO in the past was coming to the emergency department due to abdominal pain, nausea, numerous episodes of emesis and 2 episodes of loose stools followed by decreased output from ostomy for the past 2 days.She denied fever, chills, rhinorrhea, sore throat, wheezing or hemoptysis.  No chest pain, palpitations, diaphoresis, PND, orthopnea or pitting edema of the lower extremities.  No constipation, melena or hematochezia.  No flank pain, dysuria, frequency or hematuria.  No polyuria, polydipsia, polyphagia or blurred vision.   Pt admitted for SBO. General Surgery consulted  Assessment and Plan: Principal Problem:   Small bowel obstruction ([place]) -CT findings of SBO with transition zone noted -General Surgery consulted. Pt declined NG. Recs for supportive care for now -Cont NPO -.recheck BMET in AM. Need to optimize lytes   Active Problems:   Rectal adenocarcinoma s/p LAR resection 05/25/2015 Concerns for possible disease recurrence. Follow-up with oncology as an outpatient.     Colostomy in place [facility]) Showing resewn output now. Continue local care.     Pure hypercholesterolemia Resume rosuvastatin when able to tolerate diet reliably.     Hypertension BP currently stable   -cont on PRN hydralazine for now -Would resume home meds once able to reliably tolerate PO     Hypophosphatemia Replaced. Cont to follow levels     Hypothyroidism Resume levothyroxine once cleared for oral intake.     GERD (gastroesophageal reflux disease) Pantoprazole 40 mg IVP every 24 hours.     Class 3 obesity Follow-up with PCP. Recommend diet/lifestyle modification      Subjective: Complaining of abd discomfort  Physical Exam: Vitals:   07/15/22 1318 07/15/22 2059 07/16/22 0547 07/16/22 1259  BP: (!) 154/91 133/70 (!) 151/78 (!) 147/75  Pulse: 100 77 63 62  Resp: '18 18 18 18  '$ Temp: (!) 97.5 F (36.4 C) 97.8 F (36.6 C) 98.5 F (36.9 C) 98.2 F (36.8 C)  TempSrc: Oral Oral Oral Oral  SpO2: 100% 100% 100% 93%   General exam: Awake, laying in bed, in nad Respiratory system: Normal respiratory effort, no wheezing Cardiovascular system: regular rate, s1, s2 Gastrointestinal system: Soft, decreased BS Central nervous system: CN2-12 grossly intact, strength intact Extremities: Perfused, no clubbing Skin: Normal skin turgor, no notable skin lesions seen Psychiatry: Mood normal // no visual hallucinations   Data Reviewed:  Labs reviewed: na 137, K 3.9, Cr 0.69, Hgb 10.6  Family Communication: Pt in room, family not at bedside  Disposition: Status is: Inpatient Remains inpatient appropriate because: Severity of illness  Planned Discharge Destination: Home    Author: Marylu Lund, MD 07/16/2022 3:15 PM  For on call review [email or web address].

## 2022-07-16 NOTE — Progress Notes (Signed)
Lindsay Little [id number] 05-25-1966  CARE TEAM:  PCP: [name], MD  Outpatient Care Team: Patient Care Team: [name], MD as PCP - General (Family Medicine) Jerline Pain, MD as PCP - Cardiology (Cardiology) Michael Boston, MD as Consulting Physician (General Surgery) Truitt Merle, MD as Consulting Physician (Medical Oncology) Kyung Rudd, MD as Consulting Physician (Radiation Oncology) Teena Irani, MD (Inactive) as Consulting Physician (Gastroenterology) Raynelle Bring, MD as Consulting Physician (Urology) Eustace Moore, MD as Consulting Physician (Neurosurgery)  Inpatient Treatment Team: Treatment Team: Attending Provider: Donne Hazel, MD; Consulting Physician: Nolon Nations, MD; Rounding Team: Ian Bushman, MD; Pharmacist: Karren Cobble, [facility]; Registered Nurse: Oswaldo Conroy, RN; Utilization Review: Antionette Char, RN; Social Worker: Vassie Moselle, LCSW   Problem List:   Principal Problem:   Small bowel obstruction [facility]) Active Problems:   Rectal adenocarcinoma s/p LAR resection 05/25/2015   Pure hypercholesterolemia   Hypertension   Colostomy in place [facility])   Hypophosphatemia   Hypothyroidism   GERD (gastroesophageal reflux disease)      * No surgery found *      Assessment  Recurrent small bowel obstruction in the setting of numerous surgeries for lower rectal cancer.  [facility] Stay = 1 days)  Plan:  Likely this is a chronically obstructed region that flared up after getting some gastritis/food poisoning since her husband had nausea vomiting diarrhea at first as well.  -Discussed with her that standard of care is nasogastric tube decompression with small bowel protocol with Gastrografin.  She declines this.  She notes that usually these get better after a few days with some hydration and nausea control.  Unfortunate this has happened before.  Will follow clinically for now.  N.p.o. with sips.  She would not be a very hostile  abdomen to try and do lyse adhesions.  Could try some type of internal bypass away from everything to stay out of her hostile pelvis.  I rereviewed the films.  She does have chronic thickening and gas fluid collections in the pelvis.  She had a low anterior resection with colorectal anastomosis complicated with delayed hematoma/abscess/leak.  Ileal diversion and takedown with recurrence.  Omental pedicle flap with takedown anastomosis and permanent colostomy.  Parastomal hernia status post laparoscopic parastomal hernia repair with mesh.  She does have chronic scarring in the pelvis with some hydronephrosis treated with stenting.  No endoluminal evidence of malignancy.  They are tapering out the frequency of the ureteral stents instead of every 3 months to hopefully eventually every 6 months.  No new issues there.    While certainly there would be concerned about cancer recurrence, she has had numerous biopsies most recently 3 months ago with no evidence of cancer recurrence.  Dr. Burr Medico agrees.  I do not think she has any abscess that warrants any antibiotics.   -VTE prophylaxis- SCDs, etc -mobilize as tolerated to help recovery  Disposition: TBD.  Suspect in 2-3 days.      I reviewed nursing notes, hospitalist notes, last 24 h vitals and pain scores, last 48 h intake and output, last 24 h labs and trends, and last 24 h imaging results. I have reviewed this patient's available data, including medical history, events of note, test results, etc as part of my evaluation.  A significant portion of that time was spent in counseling.  Care during the described time interval was provided by me.  This care required moderate level of medical decision making.  07/16/2022  Subjective: (Chief complaint)  Patient no longer vomiting.  Nausea less.  Does not want G-tube.  Does not want to drink contrast.  Not having any flatus or output from her colostomy.  Try to get up and walk more.  Nursing  just outside room.  Objective:  Vital signs:  Vitals:   07/15/22 1005 07/15/22 1318 07/15/22 2059 07/16/22 0547  BP: (!) 151/96 (!) 154/91 133/70 (!) 151/78  Pulse: 100 100 77 63  Resp: '20 18 18 18  '$ Temp: 97.7 F (36.5 C) (!) 97.5 F (36.4 C) 97.8 F (36.6 C) 98.5 F (36.9 C)  TempSrc: Oral Oral Oral Oral  SpO2: 100% 100% 100% 100%    Last BM Date :  (ostomy)  Intake/Output   Yesterday:  01/01 0701 - 01/02 0700 In: 1009.9 [I.V.:310.6; IV Piggyback:699.3] Out: -  This shift:  No intake/output data recorded.  Bowel function:  Flatus: No  BM:  No  Drain: (No drain)   Physical Exam:  General: Pt awake/alert in no acute distress Eyes: PERRL, normal EOM.  Sclera clear.  No icterus Neuro: CN II-XII intact w/o focal sensory/motor deficits. Lymph: No head/neck/groin lymphadenopathy Psych:  No delerium/psychosis/paranoia.  Oriented x 4 HENT: Normocephalic, Mucus membranes moist.  No thrush Neck: Supple, No tracheal deviation.  No obvious thyromegaly Chest: No pain to chest wall compression.  Good respiratory excursion.  No audible wheezing CV:  Pulses intact.  Regular rhythm.  No major extremity edema MS: Normal AROM mjr joints.  No obvious deformity  Abdomen: Soft.  Nondistended.  Nontender.  Colostomy pink without any gas or stool.  No evidence of peritonitis.  No incarcerated hernias.  Ext:   No deformity.  No mjr edema.  No cyanosis Skin: No petechiae / purpurea.  No major sores.  Warm and dry    Results:   Cultures: No results found for this or any previous visit (from the past 720 hour(s)).  Labs: Results for orders placed or performed during the hospital encounter of 07/15/22 (from the past 48 hour(s))  CBG monitoring, ED     Status: Abnormal   Collection Time: 07/15/22  4:01 AM  Result Value Ref Range   Glucose-Capillary 197 (H) 70 - 99 mg/dL    Comment: Glucose reference range applies only to samples taken after fasting for at least 8 hours.   Comprehensive metabolic panel     Status: Abnormal   Collection Time: 07/15/22  4:20 AM  Result Value Ref Range   Sodium 136 135 - 145 mmol/L   Potassium 3.9 3.5 - 5.1 mmol/L   Chloride 104 98 - 111 mmol/L   CO2 18 (L) 22 - 32 mmol/L   Glucose, Bld 202 (H) 70 - 99 mg/dL    Comment: Glucose reference range applies only to samples taken after fasting for at least 8 hours.   BUN 14 6 - 20 mg/dL   Creatinine, Ser 0.96 0.44 - 1.00 mg/dL   Calcium 10.5 (H) 8.9 - 10.3 mg/dL   Total Protein 9.0 (H) 6.5 - 8.1 g/dL   Albumin 4.1 3.5 - 5.0 g/dL   AST 24 15 - 41 U/L   ALT 16 0 - 44 U/L   Alkaline Phosphatase 75 38 - 126 U/L   Total Bilirubin 0.6 0.3 - 1.2 mg/dL   GFR, Estimated >60 >60 mL/min    Comment: (NOTE) Calculated using the CKD-EPI Creatinine Equation (2021)    Anion gap 14 5 - 15    Comment: Performed at [facility]  [place] [address] [place], [place] [address]  Lipase, blood     Status: None   Collection Time: 07/15/22  4:20 AM  Result Value Ref Range   Lipase 30 11 - 51 U/L    Comment: Performed at [facility], [place] [address], [place], [place] [address]  CBC with Diff     Status: Abnormal   Collection Time: 07/15/22  4:20 AM  Result Value Ref Range   WBC 15.4 (H) 4.0 - 10.5 K/uL   RBC 5.53 (H) 3.87 - 5.11 MIL/uL   Hemoglobin 14.0 12.0 - 15.0 g/dL   HCT 45.2 36.0 - 46.0 %   MCV 81.7 80.0 - 100.0 fL   MCH 25.3 (L) 26.0 - 34.0 pg   MCHC 31.0 30.0 - 36.0 g/dL   RDW 15.9 (H) 11.5 - 15.5 %   Platelets 481 (H) 150 - 400 K/uL   nRBC 0.0 0.0 - 0.2 %   Neutrophils Relative % 84 %   Neutro Abs 13.1 (H) 1.7 - 7.7 K/uL   Lymphocytes Relative 10 %   Lymphs Abs 1.5 0.7 - 4.0 K/uL   Monocytes Relative 4 %   Monocytes Absolute 0.6 0.1 - 1.0 K/uL   Eosinophils Relative 0 %   Eosinophils Absolute 0.0 0.0 - 0.5 K/uL   Basophils Relative 1 %   Basophils Absolute 0.1 0.0 - 0.1 K/uL   Immature Granulocytes 1 %   Abs Immature Granulocytes 0.10 (H)  0.00 - 0.07 K/uL    Comment: Performed at [facility], [place] [address], [place], [place] [address]  Lactic acid, plasma     Status: Abnormal   Collection Time: 07/15/22  4:20 AM  Result Value Ref Range   Lactic Acid, Venous 3.1 (HH) 0.5 - 1.9 mmol/L    Comment: CRITICAL RESULT CALLED TO, READ BACK BY AND VERIFIED WITH LACIVITA,H. 07/15/22 '@0504'$  BY SEEL,M. Performed at [facility], [place] [address], [place], [place] [address]   Magnesium     Status: None   Collection Time: 07/15/22  4:20 AM  Result Value Ref Range   Magnesium 1.7 1.7 - 2.4 mg/dL    Comment: Performed at [facility], [place] [address], [place], [place] [address]  Phosphorus     Status: Abnormal   Collection Time: 07/15/22  4:20 AM  Result Value Ref Range   Phosphorus <1.0 (LL) 2.5 - 4.6 mg/dL    Comment: CRITICAL RESULT CALLED TO, READ BACK BY AND VERIFIED WITH Juliette Mangle RN AT 1152 07/15/22 BY TIBBITTS, K Performed at [facility], [place] [address], [place], [place] [address]   Lactic acid, plasma     Status: Abnormal   Collection Time: 07/15/22  7:56 AM  Result Value Ref Range   Lactic Acid, Venous 3.1 (HH) 0.5 - 1.9 mmol/L    Comment: CRITICAL VALUE NOTED. VALUE IS CONSISTENT WITH PREVIOUSLY REPORTED/CALLED VALUE Performed at [place] [address], [place], [place] [address]   Lactic acid, plasma     Status: None   Collection Time: 07/15/22 11:47 AM  Result Value Ref Range   Lactic Acid, Venous 1.4 0.5 - 1.9 mmol/L    Comment: Performed at [facility], [place] [address], [place], [place] [address]  Lactic acid, plasma     Status: None   Collection Time: 07/15/22  1:04 PM  Result Value Ref Range   Lactic Acid, Venous 1.2 0.5 - 1.9  mmol/L    Comment: Performed at [facility], [place] [address], [place], [place] [address]  CBC     Status: Abnormal   Collection Time: 07/16/22  4:25 AM   Result Value Ref Range   WBC 10.0 4.0 - 10.5 K/uL   RBC 4.11 3.87 - 5.11 MIL/uL   Hemoglobin 10.6 (L) 12.0 - 15.0 g/dL   HCT 36.3 36.0 - 46.0 %   MCV 88.3 80.0 - 100.0 fL    Comment: REPEATED TO VERIFY DELTA CHECK NOTED    MCH 25.8 (L) 26.0 - 34.0 pg   MCHC 29.2 (L) 30.0 - 36.0 g/dL   RDW 16.4 (H) 11.5 - 15.5 %   Platelets 200 150 - 400 K/uL   nRBC 0.0 0.0 - 0.2 %    Comment: Performed at [facility], [place] [address] [place], [place] [address]  Comprehensive metabolic panel     Status: Abnormal   Collection Time: 07/16/22  4:25 AM  Result Value Ref Range   Sodium 137 135 - 145 mmol/L   Potassium 3.9 3.5 - 5.1 mmol/L   Chloride 110 98 - 111 mmol/L   CO2 19 (L) 22 - 32 mmol/L   Glucose, Bld 114 (H) 70 - 99 mg/dL    Comment: Glucose reference range applies only to samples taken after fasting for at least 8 hours.   BUN 10 6 - 20 mg/dL   Creatinine, Ser 0.69 0.44 - 1.00 mg/dL   Calcium 8.5 (L) 8.9 - 10.3 mg/dL    Comment: DELTA CHECK NOTED   Total Protein 7.2 6.5 - 8.1 g/dL   Albumin 3.1 (L) 3.5 - 5.0 g/dL   AST 13 (L) 15 - 41 U/L   ALT 12 0 - 44 U/L   Alkaline Phosphatase 58 38 - 126 U/L   Total Bilirubin 0.5 0.3 - 1.2 mg/dL   GFR, Estimated >60 >60 mL/min    Comment: (NOTE) Calculated using the CKD-EPI Creatinine Equation (2021)    Anion gap 8 5 - 15    Comment: Performed at [facility], [place] [address] [place],  [address]  Phosphorus     Status: None   Collection Time: 07/16/22  4:25 AM  Result Value Ref Range   Phosphorus 2.6 2.5 - 4.6 mg/dL    Comment: Performed at [facility], [place] [address] [place],  [address]    Imaging / Studies: DG Abd Portable 1V  Result Date: 07/16/2022 CLINICAL DATA:  Small-bowel obstruction EXAM: PORTABLE ABDOMEN - 1 VIEW COMPARISON:  CT 07/15/2022 FINDINGS: There are persistent mildly dilated small bowel loops in the lower abdomen. Degree of distention is similar  to recent CT. There is mild gaseous distention of the colon. Bilateral nephroureteral stents are in place. Pelvic phleboliths. Prior lumbar fusion. IMPRESSION: Persistent mildly dilated loops of small bowel in the lower abdomen, compatible with obstruction as seen on recent CT. Electronically Signed   By: Maurine Simmering M.D.   On: 07/16/2022 09:16   CT ABDOMEN PELVIS W CONTRAST  Result Date: 07/15/2022 CLINICAL DATA:  Abdominal pain, nausea and vomiting, with colostomy and history of rectal cancer with resection and recurrence August 2023. EXAM: CT ABDOMEN AND PELVIS WITH CONTRAST TECHNIQUE: Multidetector CT imaging of the abdomen and pelvis was performed using the standard protocol following bolus administration of intravenous contrast. RADIATION DOSE REDUCTION: This exam was performed according to the departmental dose-optimization program which includes automated exposure control, adjustment of the mA  and/or kV according to patient size and/or use of iterative reconstruction technique. CONTRAST:  173m OMNIPAQUE IOHEXOL 300 MG/ML  SOLN COMPARISON:  PET-CT 03/06/2022, CT without contrast 02/15/22. FINDINGS: Lower chest: No acute abnormality. Hepatobiliary: Hepatic steatosis. No mass enhancement. Unremarkable gallbladder and bile ducts. Pancreas: Partially atrophic.  No other abnormality. Spleen: Mildly prominent, 13.6 cm coronal.  No mass. Adrenals/Urinary Tract: No adrenal or renal mass is seen. There is fetal lobation of both kidneys, scattered cortical scarring on the left. Interval bilateral ureteral stent insertions decompression of the renal collecting systems. The bladder is contracted and not well seen but was previously thickened on both prior CTs. Stomach/Bowel: Unremarkable gastric wall. Dilatation of upper to mid abdominal small bowel primarily due to the left is again noted up to 4.4 cm. Similar findings were noted on 02/15/2022 but the bowel had decompressed as of 03/06/2022. Left lower quadrant  transition suspected on axial images 64-71 of series 2 and etiology most likely due to adhesions, with an angulated partially feces filled segment in the area of transition. Remaining small bowel is normal caliber. The large bowel wall is largely contracted. An appendix is not seen. There is a descending colostomy and prior abdominal perineal resection. No bowel pneumatosis is seen. Vascular/Lymphatic: There is mild aortic calcific plaque. Again noted are slightly prominent left common iliac chain nodes up to 1 cm in short axis, at least 1 of which showed productivity There are few bilateral borderline sized inguinal chain nodes. One of these on the left demonstrated PET activity previously, specifically on series 2 axial 98. There is interval enlargement of right lower quadrant mesenteric nodes which could be metastatic. Largest of these is 1.4 cm in short axis on 2:70. No other significant lymph nodes are seen. Reproductive: The uterus is absent. Left adnexal abutting cystic structures up to 2.4 cm are unchanged. Other: Presacral soft tissue thickening extending down to the posterior pelvic floor, strongly PET positive, is again noted. The greatest diameter of this is 8.3 x 6.1 cm on 2:87. It is not significantly changed in overall size. There is an air-fluid collection within this at the level of the coccyx and just above the pelvic floor measuring 4.9 x 1.8 cm. This was also seen previously with scattered air pockets in the soft tissue above this level, unknown if this represents small-bowel engulfed by tumor or whether there could be a small bowel fistula to the area with infectious process, but this is similar in appearance to the previous studies. Musculoskeletal: There is levoscoliosis, degenerative and postsurgical changes of the lumbar spine with L4-5 posterior rods and pedicle screws and interbody metal hardware, advanced disc collapse at L3-4. No destructive bone lesion is seen. IMPRESSION: 1.  Small-bowel obstruction, at least of intermediate-grade, with transition point in the left lower quadrant, most likely due to adhesions. 2. Abdominal perineal resection for rectal carcinoma with left upper abdominal descending colostomy. 3. Presacral soft tissue thickening consistent with recurrent tumor and PET positive, extending down to the posterior pelvic floor, not significantly changed in overall size. 4. There is an air-fluid collection within this at the level of the coccyx and just above the pelvic floor measuring 4.9 x 1.8 cm. This was also seen previously with scattered air pockets in the soft tissue above this level, unknown if this represents small-bowel engulfed by tumor or whether there could be a small bowel fistula to the area with infectious process, but this is similar in appearance to the previous studies. 5. Interval enlargement  of right lower quadrant mesenteric nodes which could be metastatic. 6. Interval bilateral ureteral stent placement with decompression of the renal collecting systems. 7. Hepatic steatosis and mild splenomegaly. 8. Aortic atherosclerosis. Aortic Atherosclerosis (ICD10-I70.0). Electronically Signed   By: Telford Nab M.D.   On: 07/15/2022 07:06    Medications / Allergies: per chart  Antibiotics: Anti-infectives (From admission, onward)    None         Note: Portions of this report may have been transcribed using voice recognition software. Every effort was made to ensure accuracy; however, inadvertent computerized transcription errors may be present.   Any transcriptional errors that result from this process are unintentional.    [name], MD, FACS, MASCRS Esophageal, Gastrointestinal & Colorectal Surgery Robotic and Minimally Invasive Surgery  [place]. [address] [place], [place] [address] [phone] Fax [phone] Main  CONTACT INFORMATION:  Weekday  (9AM-5PM): Call [facility] main office at [phone]  Weeknight (5PM-9AM) or Weekend/Holiday: Check [email or web address] (password " [email or web address]") for General Surgery [facility] coverage  (Please, do not use SecureChat as it is not reliable communication to reach operating surgeons for immediate patient care given surgeries/outpatient duties/clinic/cross-coverage/off post-call which would lead to a delay in care.  Epic staff messaging available for outptient concerns, but may not be answered for 48 hours or more).     07/16/2022  10:10 AM

## 2022-07-16 NOTE — Progress Notes (Signed)
  Transition of Care [facility]) Screening Note   Patient Details  Name: Lindsay Little Date of Birth: 1966-01-17   Transition of Care [facility]) CM/SW Contact:    Vassie Moselle, LCSW Phone Number: 07/16/2022, 3:05 PM    Transition of Care Department [facility]) has reviewed patient and no TOC needs have been identified at this time. We will continue to monitor patient advancement through interdisciplinary progression rounds. If new patient transition needs arise, please place a TOC consult.

## 2022-07-16 NOTE — Hospital Course (Signed)
57 y.o. female with medical history significant of Rectal cancer with history of hypertension, hyperlipidemia, osteomyelitis, lumbar discitis, chronic pain syndrome, class III obesity, GERD, mixed urinary incontinence, overactive bladder, IBS, hypophosphatemia, peripheral neuropathy due to chemotherapy, hydroureter, bilateral hydronephrosis, cavernous hemangioma of the liver, anemia chronic disease, chron's disease rectal cancer with history of resection/colostomy complicated by rectovaginal fistula and intra-abdominal abscesses, multiple episodes of SBO in the past was coming to the emergency department due to abdominal pain, nausea, numerous episodes of emesis and 2 episodes of loose stools followed by decreased output from ostomy for the past 2 days.She denied fever, chills, rhinorrhea, sore throat, wheezing or hemoptysis.  No chest pain, palpitations, diaphoresis, PND, orthopnea or pitting edema of the lower extremities.  No constipation, melena or hematochezia.  No flank pain, dysuria, frequency or hematuria.  No polyuria, polydipsia, polyphagia or blurred vision.   Pt admitted for SBO. General Surgery consulted

## 2022-07-16 NOTE — Progress Notes (Addendum)
Patient still refusing to attempt to drink gastrografin.

## 2022-07-17 DIAGNOSIS — K56609 Unspecified intestinal obstruction, unspecified as to partial versus complete obstruction: Secondary | ICD-10-CM | POA: Diagnosis not present

## 2022-07-17 LAB — COMPREHENSIVE METABOLIC PANEL
ALT: 13 U/L (ref 0–44)
AST: 14 U/L — ABNORMAL LOW (ref 15–41)
Albumin: 3.4 g/dL — ABNORMAL LOW (ref 3.5–5.0)
Alkaline Phosphatase: 58 U/L (ref 38–126)
Anion gap: 9 (ref 5–15)
BUN: 8 mg/dL (ref 6–20)
CO2: 22 mmol/L (ref 22–32)
Calcium: 9.1 mg/dL (ref 8.9–10.3)
Chloride: 107 mmol/L (ref 98–111)
Creatinine, Ser: 0.72 mg/dL (ref 0.44–1.00)
GFR, Estimated: >60 mL/min (ref >60)
Glucose, Bld: 106 mg/dL — ABNORMAL HIGH (ref 70–99)
Potassium: 3.7 mmol/L (ref 3.5–5.1)
Sodium: 138 mmol/L (ref 135–145)
Total Bilirubin: 0.4 mg/dL (ref 0.3–1.2)
Total Protein: 7.6 g/dL (ref 6.5–8.1)

## 2022-07-17 LAB — CBC
HCT: 35.8 % — ABNORMAL LOW (ref 36.0–46.0)
Hemoglobin: 10.7 g/dL — ABNORMAL LOW (ref 12.0–15.0)
MCH: 25.7 pg — ABNORMAL LOW (ref 26.0–34.0)
MCHC: 29.9 g/dL — ABNORMAL LOW (ref 30.0–36.0)
MCV: 85.9 fL (ref 80.0–100.0)
Platelets: 308 10*3/uL (ref 150–400)
RBC: 4.17 MIL/uL (ref 3.87–5.11)
RDW: 16.4 % — ABNORMAL HIGH (ref 11.5–15.5)
WBC: 7.5 10*3/uL (ref 4.0–10.5)
nRBC: 0 % (ref 0.0–0.2)

## 2022-07-17 LAB — MAGNESIUM: Magnesium: 1.8 mg/dL (ref 1.7–2.4)

## 2022-07-17 MED ORDER — PANTOPRAZOLE SODIUM 40 MG PO TBEC
40.0000 mg | DELAYED_RELEASE_TABLET | Freq: Every day | ORAL | Status: DC
  Administered 2022-07-17 – 2022-07-20 (×4): 40 mg via ORAL
  Filled 2022-07-17 (×4): qty 1

## 2022-07-17 MED ORDER — HYDROMORPHONE HCL 4 MG PO TABS: 4.0000 mg | ORAL_TABLET | Freq: Four times a day (QID) | ORAL | Status: DC | PRN

## 2022-07-17 MED ORDER — LEVOTHYROXINE SODIUM 25 MCG PO TABS
50.0000 ug | ORAL_TABLET | Freq: Every day | ORAL | Status: DC
  Administered 2022-07-17 – 2022-07-20 (×4): 50 ug via ORAL
  Filled 2022-07-17 (×4): qty 2

## 2022-07-17 MED ORDER — PREGABALIN 75 MG PO CAPS
225.0000 mg | ORAL_CAPSULE | Freq: Two times a day (BID) | ORAL | Status: DC
  Administered 2022-07-17 – 2022-07-20 (×7): 225 mg via ORAL
  Filled 2022-07-17 (×7): qty 3

## 2022-07-17 MED ORDER — SODIUM CHLORIDE 0.9 % IV SOLN: INTRAVENOUS | Status: AC

## 2022-07-17 MED ORDER — BISOPROLOL FUMARATE 5 MG PO TABS
10.0000 mg | ORAL_TABLET | Freq: Every day | ORAL | Status: DC
  Administered 2022-07-17 – 2022-07-20 (×4): 10 mg via ORAL
  Filled 2022-07-17 (×4): qty 2

## 2022-07-17 MED ORDER — LEVETIRACETAM 250 MG PO TABS
250.0000 mg | ORAL_TABLET | Freq: Two times a day (BID) | ORAL | Status: DC
  Administered 2022-07-17 – 2022-07-20 (×7): 250 mg via ORAL
  Filled 2022-07-17 (×7): qty 1

## 2022-07-17 MED ORDER — HYDROMORPHONE HCL 2 MG PO TABS
4.0000 mg | ORAL_TABLET | Freq: Four times a day (QID) | ORAL | Status: DC | PRN
  Administered 2022-07-17 – 2022-07-20 (×12): 4 mg via ORAL
  Filled 2022-07-17 (×12): qty 2

## 2022-07-17 MED ORDER — OXYBUTYNIN CHLORIDE ER 5 MG PO TB24
10.0000 mg | ORAL_TABLET | Freq: Every day | ORAL | Status: DC
  Administered 2022-07-17 – 2022-07-20 (×4): 10 mg via ORAL
  Filled 2022-07-17 (×4): qty 2

## 2022-07-17 MED ORDER — CELECOXIB 100 MG PO CAPS
100.0000 mg | ORAL_CAPSULE | Freq: Two times a day (BID) | ORAL | Status: DC
  Administered 2022-07-17 – 2022-07-20 (×7): 100 mg via ORAL
  Filled 2022-07-17 (×7): qty 1

## 2022-07-17 MED ORDER — DULOXETINE HCL 60 MG PO CPEP
120.0000 mg | ORAL_CAPSULE | Freq: Every day | ORAL | Status: DC
  Administered 2022-07-17 – 2022-07-20 (×4): 120 mg via ORAL
  Filled 2022-07-17 (×4): qty 2

## 2022-07-17 NOTE — Progress Notes (Signed)
PROGRESS NOTE    Lindsay Little  RDE:[id number] DOB: [birth year] DOA: 07/15/2022 PCP: [name], MD   Brief Narrative:  57 y.o. female with medical history significant of Rectal cancer with history of hypertension, hyperlipidemia, osteomyelitis, lumbar discitis, chronic pain syndrome, class III obesity, GERD, mixed urinary incontinence, overactive bladder, IBS, hypophosphatemia, peripheral neuropathy due to chemotherapy, hydroureter, bilateral hydronephrosis, cavernous hemangioma of the liver, anemia chronic disease, chron's disease rectal cancer with history of resection/colostomy complicated by rectovaginal fistula and intra-abdominal abscesses, multiple episodes of SBO in the past was coming to the emergency department due to abdominal pain, nausea, numerous episodes of emesis and 2 episodes of loose stools followed by decreased output from ostomy for the past 2 days.She denied fever, chills, rhinorrhea, sore throat, wheezing or hemoptysis.  No chest pain, palpitations, diaphoresis, PND, orthopnea or pitting edema of the lower extremities.  No constipation, melena or hematochezia.  No flank pain, dysuria, frequency or hematuria.  No polyuria, polydipsia, polyphagia or blurred vision.   Pt admitted for SBO. General Surgery consulted    Assessment & Plan:   Principal Problem:   Small bowel obstruction (HCC) Active Problems:   Pure hypercholesterolemia   Rectal adenocarcinoma s/p LAR resection 05/25/2015   Hypertension   Colostomy in place [facility])   Hypophosphatemia   Hypothyroidism   GERD (gastroesophageal reflux disease)  Assessment and Plan:  Principal Problem:   Small bowel obstruction ([place]) -CT findings of SBO with transition zone noted -General Surgery consulted. Pt declined NG. Recs for supportive care for now -Advance to clear liquid diet per general surgery today, 1/3 -.recheck BMET in AM. Need to optimize lytes -Continue IV fluid   Active Problems:   Rectal  adenocarcinoma s/p LAR resection 05/25/2015 Concerns for possible disease recurrence. Follow-up with oncology as an outpatient.     Colostomy in place [facility]) Showing resewn output now. Continue local care.     Pure hypercholesterolemia Resume rosuvastatin when able to tolerate diet reliably.     Hypertension BP currently stable  -cont on PRN hydralazine for now -Would resume home meds once able to reliably tolerate PO, anticipate by a.m.     Hypothyroidism Resume levothyroxine today     GERD (gastroesophageal reflux disease) Pantoprazole 40 mg IVP every 24 hours.     Class 3 obesity Follow-up with PCP. Recommend diet/lifestyle modification   DVT prophylaxis:Lovenox Code Status: Full Family Communication: None at bedside Disposition Plan:  Status is: Inpatient Remains inpatient appropriate because: Need for IV meds.   Consultants:  GS  Procedures:  None  Antimicrobials:  None   Subjective: Patient seen and evaluated today with some passage of flatus noted, no BM.  She is overall feeling better today and states that abdominal pain and bloating are less, however she is having back and leg pain.  Nausea improved and no further vomiting.  Objective: Vitals:   07/16/22 0547 07/16/22 1259 07/16/22 2155 07/17/22 0529  BP: (!) 151/78 (!) 147/75 139/66 (!) 152/81  Pulse: 63 62 66 (!) 54  Resp: '18 18 18 16  '$ Temp: 98.5 F (36.9 C) 98.2 F (36.8 C) 98 F (36.7 C) 98.1 F (36.7 C)  TempSrc: Oral Oral Oral Oral  SpO2: 100% 93% 100% 100%   No intake or output data in the 24 hours ending 07/17/22 0744 There were no vitals filed for this visit.  Examination:  General exam: Appears calm and comfortable  Respiratory system: Clear to auscultation. Respiratory effort normal. Cardiovascular system: S1 & S2 heard, RRR.  Gastrointestinal system: Abdomen is soft, air but no stool in ostomy bag Central nervous system: Alert and awake Extremities: No edema Skin: No  significant lesions noted Psychiatry: Flat affect.    Data Reviewed: I have personally reviewed following labs and imaging studies  CBC: Recent Labs  Lab 07/15/22 0420 07/16/22 0425 07/17/22 0402  WBC 15.4* 10.0 7.5  NEUTROABS 13.1*  --   --   HGB 14.0 10.6* 10.7*  HCT 45.2 36.3 35.8*  MCV 81.7 88.3 85.9  PLT 481* 200 546   Basic Metabolic Panel: Recent Labs  Lab 07/15/22 0420 07/16/22 0425 07/17/22 0402  NA 136 137 138  K 3.9 3.9 3.7  CL 104 110 107  CO2 18* 19* 22  GLUCOSE 202* 114* 106*  BUN '14 10 8  '$ CREATININE 0.96 0.69 0.72  CALCIUM 10.5* 8.5* 9.1  MG 1.7  --  1.8  PHOS <1.0* 2.6  --    GFR: CrCl cannot be calculated (Unknown ideal weight.). Liver Function Tests: Recent Labs  Lab 07/15/22 0420 07/16/22 0425 07/17/22 0402  AST 24 13* 14*  ALT '16 12 13  '$ ALKPHOS 75 58 58  BILITOT 0.6 0.5 0.4  PROT 9.0* 7.2 7.6  ALBUMIN 4.1 3.1* 3.4*   Recent Labs  Lab 07/15/22 0420  LIPASE 30   No results for input(s): "AMMONIA" in the last 168 hours. Coagulation Profile: No results for input(s): "INR", "PROTIME" in the last 168 hours. Cardiac Enzymes: No results for input(s): "CKTOTAL", "CKMB", "CKMBINDEX", "TROPONINI" in the last 168 hours. BNP (last 3 results) No results for input(s): "PROBNP" in the last 8760 hours. HbA1C: No results for input(s): "HGBA1C" in the last 72 hours. CBG: Recent Labs  Lab 07/15/22 0401 07/16/22 1549  GLUCAP 197* 81   Lipid Profile: No results for input(s): "CHOL", "HDL", "LDLCALC", "TRIG", "CHOLHDL", "LDLDIRECT" in the last 72 hours. Thyroid Function Tests: No results for input(s): "TSH", "T4TOTAL", "FREET4", "T3FREE", "THYROIDAB" in the last 72 hours. Anemia Panel: No results for input(s): "VITAMINB12", "FOLATE", "FERRITIN", "TIBC", "IRON", "RETICCTPCT" in the last 72 hours. Sepsis Labs: Recent Labs  Lab 07/15/22 0420 07/15/22 0756 07/15/22 1147 07/15/22 1304  LATICACIDVEN 3.1* 3.1* 1.4 1.2    No results found  for this or any previous visit (from the past 240 hour(s)).       Radiology Studies: DG Abd Portable 1V  Result Date: 07/16/2022 CLINICAL DATA:  Small-bowel obstruction EXAM: PORTABLE ABDOMEN - 1 VIEW COMPARISON:  CT 07/15/2022 FINDINGS: There are persistent mildly dilated small bowel loops in the lower abdomen. Degree of distention is similar to recent CT. There is mild gaseous distention of the colon. Bilateral nephroureteral stents are in place. Pelvic phleboliths. Prior lumbar fusion. IMPRESSION: Persistent mildly dilated loops of small bowel in the lower abdomen, compatible with obstruction as seen on recent CT. Electronically Signed   By: Maurine Simmering M.D.   On: 07/16/2022 09:16        Scheduled Meds:  enoxaparin (LOVENOX) injection  40 mg Subcutaneous Q24H   Continuous Infusions:  sodium chloride 125 mL/hr at 07/17/22 0540     LOS: 2 days    Time spent: 35 minutes    Neviah Braud Darleen Crocker, DO [facility]  If 7PM-7AM, please contact night-coverage [email or web address] 07/17/2022, 7:44 AM

## 2022-07-17 NOTE — Progress Notes (Signed)
[facility] Progress Note     Subjective: CC-  Up in chair. Feeling better today. Abdominal pain and bloating are less. Nausea improving and she has had no vomiting. Started passing flatus over night, no stool.  Objective: Vital signs in last 24 hours: Temp:  [98 F (36.7 C)-98.2 F (36.8 C)] 98.1 F (36.7 C) (01/03 0529) Pulse Rate:  [54-66] 54 (01/03 0529) Resp:  [16-18] 16 (01/03 0529) BP: (139-152)/(66-81) 152/81 (01/03 0529) SpO2:  [93 %-100 %] 100 % (01/03 0529) Last BM Date :  (ostomy)  Intake/Output from previous day: No intake/output data recorded. Intake/Output this shift: No intake/output data recorded.  PE: Gen:  Alert, NAD, pleasant Pulm:  rate and effort normal on room air Abd: Soft, nondistended, nontender, air but no stool in colostomy bag  Lab Results:  Recent Labs    07/16/22 0425 07/17/22 0402  WBC 10.0 7.5  HGB 10.6* 10.7*  HCT 36.3 35.8*  PLT 200 308   BMET Recent Labs    07/16/22 0425 07/17/22 0402  NA 137 138  K 3.9 3.7  CL 110 107  CO2 19* 22  GLUCOSE 114* 106*  BUN 10 8  CREATININE 0.69 0.72  CALCIUM 8.5* 9.1   PT/INR No results for input(s): "LABPROT", "INR" in the last 72 hours. CMP     Component Value Date/Time   NA 138 07/17/2022 0402   NA 140 05/30/2017 1009   K 3.7 07/17/2022 0402   K 4.3 05/30/2017 1009   CL 107 07/17/2022 0402   CO2 22 07/17/2022 0402   CO2 25 05/30/2017 1009   GLUCOSE 106 (H) 07/17/2022 0402   GLUCOSE 97 05/30/2017 1009   BUN 8 07/17/2022 0402   BUN 17.7 05/30/2017 1009   CREATININE 0.72 07/17/2022 0402   CREATININE 0.71 07/01/2019 1339   CREATININE 0.7 05/30/2017 1009   CALCIUM 9.1 07/17/2022 0402   CALCIUM 9.7 05/30/2017 1009   PROT 7.6 07/17/2022 0402   PROT 7.2 05/30/2017 1009   ALBUMIN 3.4 (L) 07/17/2022 0402   ALBUMIN 3.8 05/30/2017 1009   AST 14 (L) 07/17/2022 0402   AST 14 (L) 07/01/2019 1339   AST 16 05/30/2017 1009   ALT 13 07/17/2022 0402   ALT 12 07/01/2019  1339   ALT 22 05/30/2017 1009   ALKPHOS 58 07/17/2022 0402   ALKPHOS 89 05/30/2017 1009   BILITOT 0.4 07/17/2022 0402   BILITOT 0.3 07/01/2019 1339   BILITOT 0.52 05/30/2017 1009   GFRNONAA >60 07/17/2022 0402   GFRNONAA >60 07/01/2019 1339   GFRAA >60 08/25/2019 1326   GFRAA >60 07/01/2019 1339   Lipase     Component Value Date/Time   LIPASE 30 07/15/2022 0420       Studies/Results: DG Abd Portable 1V  Result Date: 07/16/2022 CLINICAL DATA:  Small-bowel obstruction EXAM: PORTABLE ABDOMEN - 1 VIEW COMPARISON:  CT 07/15/2022 FINDINGS: There are persistent mildly dilated small bowel loops in the lower abdomen. Degree of distention is similar to recent CT. There is mild gaseous distention of the colon. Bilateral nephroureteral stents are in place. Pelvic phleboliths. Prior lumbar fusion. IMPRESSION: Persistent mildly dilated loops of small bowel in the lower abdomen, compatible with obstruction as seen on recent CT. Electronically Signed   By: Maurine Simmering M.D.   On: 07/16/2022 09:16    Anti-infectives: Anti-infectives (From admission, onward)    None        Assessment/Plan Recurrent small bowel obstruction in the setting of numerous surgeries for lower  rectal cancer - previously refused nasogastric tube decompression with small bowel protocol with Gastrografin or oral gastrografin - Feeling better and passing flatus. Will trial clears today. Mobilize.   ID - none FEN - IVF, CLD VTE - lovenox Foley - none   I reviewed last 24 h vitals and pain scores, last 48 h intake and output, last 24 h labs and trends, and last 24 h imaging results.    LOS: 2 days    Wellington Hampshire, [facility] 07/17/2022, 8:14 AM Please see Amion for pager number during day hours 7:00am-4:30pm

## 2022-07-17 NOTE — Progress Notes (Signed)
Initial Nutrition Assessment  DOCUMENTATION CODES:   Not applicable  INTERVENTION:  - Clear Liquid diet per MD. Advance as tolerated. - Encourage intake of small and frequent meals for better diet tolerance.  - Consider daily multivitamin to support micronutrient needs.    NUTRITION DIAGNOSIS:   Inadequate oral intake related to altered GI function (SBO) as evidenced by energy intake < or equal to 50% for > or equal to 5 days.  GOAL:   Patient will meet greater than or equal to 90% of their needs  MONITOR:   PO intake, Diet advancement  REASON FOR ASSESSMENT:   Malnutrition Screening Tool    ASSESSMENT:   57 y.o. female with PMH significant of HTN, HLD, osteomyelitis, IBS, anemia chronic disease, and rectal cancer with history of resection/colostomy complicated by rectovaginal fistula and intra-abdominal abscesses, multiple episodes of SBO in the past. Presented due to abdominal pain, nausea, numerous episodes of emesis and 2 episodes of loose stools followed by decreased output from ostomy for the past 2 days. Admitted for SBO.   Patient sitting at edge of bed at time of visit, awaiting breakfast tray. She reports UBW of 300-305# and denied recent changes in weight other than a few pounds since becoming sick the past few days. Per EMR,  no weight taken since September to assess for recent changes. Patient notes that prior to getting sick, she would often only have 1 meal a day but would drink a lot of fluid (mostly sweet tea) as she knows she needs to for her ostomy. Notes that her food intake varies as her husband is bedbound, her son has cerebal palsy, and her daughter is in college so she just makes what she can. Appetite is usually pretty poor at baseline.  Over the past few days PTA she reports she has a lot of nausea and vomiting so was eating very little. Diet advanced to clear liquids this morning and patient feeling well and hoping she can eat. Denies wanting to try any  supplements during admission. She is hopeful she will tolerate a diet and get home soon.   Medications reviewed and include: Synthroid  Labs reviewed:  -   NUTRITION - FOCUSED PHYSICAL EXAM:  Flowsheet Row Most Recent Value  Orbital Region No depletion  Upper Arm Region No depletion  Thoracic and Lumbar Region No depletion  Buccal Region No depletion  Temple Region No depletion  Clavicle Bone Region No depletion  Clavicle and Acromion Bone Region No depletion  Scapular Bone Region Unable to assess  Dorsal Hand No depletion  Patellar Region No depletion  Anterior Thigh Region No depletion  Posterior Calf Region No depletion  Edema (RD Assessment) None  Hair Reviewed  Eyes Reviewed  Mouth Reviewed  Skin Reviewed  Nails Reviewed       Diet Order:   Diet Order             Diet clear liquid Room service appropriate? Yes; Fluid consistency: Thin  Diet effective now                   EDUCATION NEEDS:  Education needs have been addressed  Skin:  Skin Assessment: Reviewed RN Assessment  Last BM:  1/2 - ostomy  Height:  Ht Readings from Last 1 Encounters:  03/26/22 '5\' 11"'$  (1.803 m)   Weight:  Wt Readings from Last 1 Encounters:  03/26/22 (!) 138.3 kg   Ideal Body Weight:  70.45 kg  BMI:  There is no  height or weight on file to calculate BMI.  Estimated Nutritional Needs:  Kcal:  7096-4383 kcals Protein:  85-105 grams Fluid:  >/= 2.3L    Samson Frederic RD, LDN For contact information, refer to [facility].

## 2022-07-18 ENCOUNTER — Inpatient Hospital Stay (HOSPITAL_COMMUNITY): Payer: Medicare HMO

## 2022-07-18 DIAGNOSIS — K56609 Unspecified intestinal obstruction, unspecified as to partial versus complete obstruction: Secondary | ICD-10-CM | POA: Diagnosis not present

## 2022-07-18 LAB — CBC
HCT: 32.7 % — ABNORMAL LOW (ref 36.0–46.0)
Hemoglobin: 10 g/dL — ABNORMAL LOW (ref 12.0–15.0)
MCH: 25.8 pg — ABNORMAL LOW (ref 26.0–34.0)
MCHC: 30.6 g/dL (ref 30.0–36.0)
MCV: 84.3 fL (ref 80.0–100.0)
Platelets: 284 10*3/uL (ref 150–400)
RBC: 3.88 MIL/uL (ref 3.87–5.11)
RDW: 15.9 % — ABNORMAL HIGH (ref 11.5–15.5)
WBC: 6.6 10*3/uL (ref 4.0–10.5)
nRBC: 0 % (ref 0.0–0.2)

## 2022-07-18 LAB — BASIC METABOLIC PANEL
Anion gap: 6 (ref 5–15)
BUN: 6 mg/dL (ref 6–20)
CO2: 23 mmol/L (ref 22–32)
Calcium: 9 mg/dL (ref 8.9–10.3)
Chloride: 109 mmol/L (ref 98–111)
Creatinine, Ser: 0.67 mg/dL (ref 0.44–1.00)
GFR, Estimated: >60 mL/min (ref >60)
Glucose, Bld: 99 mg/dL (ref 70–99)
Potassium: 3.7 mmol/L (ref 3.5–5.1)
Sodium: 138 mmol/L (ref 135–145)

## 2022-07-18 LAB — MAGNESIUM: Magnesium: 1.9 mg/dL (ref 1.7–2.4)

## 2022-07-18 MED ORDER — DIATRIZOATE MEGLUMINE & SODIUM 66-10 % PO SOLN
90.0000 mL | Freq: Once | ORAL | Status: AC
  Administered 2022-07-18: 90 mL via ORAL
  Filled 2022-07-18: qty 90

## 2022-07-18 NOTE — Progress Notes (Signed)
PROGRESS NOTE    Lindsay Little  XKG:[id number] DOB: [birth year] DOA: 07/15/2022 PCP: [name], MD   Brief Narrative:  57 y.o. female with medical history significant of Rectal cancer with history of hypertension, hyperlipidemia, osteomyelitis, lumbar discitis, chronic pain syndrome, class III obesity, GERD, mixed urinary incontinence, overactive bladder, IBS, hypophosphatemia, peripheral neuropathy due to chemotherapy, hydroureter, bilateral hydronephrosis, cavernous hemangioma of the liver, anemia chronic disease, chron's disease rectal cancer with history of resection/colostomy complicated by rectovaginal fistula and intra-abdominal abscesses, multiple episodes of SBO in the past was coming to the emergency department due to abdominal pain, nausea, numerous episodes of emesis and 2 episodes of loose stools followed by decreased output from ostomy for the past 2 days.She denied fever, chills, rhinorrhea, sore throat, wheezing or hemoptysis.  No chest pain, palpitations, diaphoresis, PND, orthopnea or pitting edema of the lower extremities.  No constipation, melena or hematochezia.  No flank pain, dysuria, frequency or hematuria.  No polyuria, polydipsia, polyphagia or blurred vision.   Pt admitted for SBO. General Surgery consulted    Assessment & Plan:   Principal Problem:   Small bowel obstruction (HCC) Active Problems:   Pure hypercholesterolemia   Rectal adenocarcinoma s/p LAR resection 05/25/2015   Hypertension   Colostomy in place [facility])   Hypophosphatemia   Hypothyroidism   GERD (gastroesophageal reflux disease)  Assessment and Plan:  Principal Problem:   Small bowel obstruction (HCC)-improving -CT findings of SBO with transition zone noted -General Surgery consulted. Pt declined NG. Recs for supportive care for now -Advance to clear liquid diet per general surgery 1/3 -Gastrografin study per general surgery 1/4 -Continue IV fluid   Active Problems:   Rectal  adenocarcinoma s/p LAR resection 05/25/2015 Concerns for possible disease recurrence. Follow-up with oncology as an outpatient.     Colostomy in place [facility]) Showing resewn output now. Continue local care.     Pure hypercholesterolemia Resume rosuvastatin when able to tolerate diet reliably.     Hypertension BP currently stable  -cont on PRN hydralazine for now -Would resume home meds once able to reliably tolerate PO, anticipate by a.m.     Hypothyroidism Resume levothyroxine today     GERD (gastroesophageal reflux disease) Pantoprazole 40 mg IVP every 24 hours.     Class 3 obesity Follow-up with PCP. Recommend diet/lifestyle modification   DVT prophylaxis:Lovenox Code Status: Full Family Communication: None at bedside Disposition Plan:  Status is: Inpatient Remains inpatient appropriate because: Need for IV meds.     Consultants:  GS   Procedures:  None   Antimicrobials:  None   Subjective: Patient seen and evaluated today with improved pain control noted.  She denies any further nausea or vomiting and is tolerating clear liquids as well as her usual medications.  Still not having any bowel movement.  Objective: Vitals:   07/17/22 0529 07/17/22 1258 07/17/22 2026 07/18/22 0605  BP: (!) 152/81 106/72 (!) 141/99 (!) 158/81  Pulse: (!) 54 61 (!) 52 (!) 52  Resp: '16 18 17 16  '$ Temp: 98.1 F (36.7 C) 97.8 F (36.6 C) 97.8 F (36.6 C) 97.7 F (36.5 C)  TempSrc: Oral Oral Oral Oral  SpO2: 100% 97% 100% 98%   No intake or output data in the 24 hours ending 07/18/22 0734 There were no vitals filed for this visit.  Examination:  General exam: Appears calm and comfortable  Respiratory system: Clear to auscultation. Respiratory effort normal. Cardiovascular system: S1 & S2 heard, RRR.  Gastrointestinal system: Abdomen is  soft, ostomy bag with trace air but no stool Central nervous system: Alert and awake Extremities: No edema Skin: No significant lesions  noted Psychiatry: Flat affect.    Data Reviewed: I have personally reviewed following labs and imaging studies  CBC: Recent Labs  Lab 07/15/22 0420 07/16/22 0425 07/17/22 0402 07/18/22 0338  WBC 15.4* 10.0 7.5 6.6  NEUTROABS 13.1*  --   --   --   HGB 14.0 10.6* 10.7* 10.0*  HCT 45.2 36.3 35.8* 32.7*  MCV 81.7 88.3 85.9 84.3  PLT 481* 200 308 024   Basic Metabolic Panel: Recent Labs  Lab 07/15/22 0420 07/16/22 0425 07/17/22 0402 07/18/22 0338  NA 136 137 138 138  K 3.9 3.9 3.7 3.7  CL 104 110 107 109  CO2 18* 19* 22 23  GLUCOSE 202* 114* 106* 99  BUN '14 10 8 6  '$ CREATININE 0.96 0.69 0.72 0.67  CALCIUM 10.5* 8.5* 9.1 9.0  MG 1.7  --  1.8 1.9  PHOS <1.0* 2.6  --   --    GFR: CrCl cannot be calculated (Unknown ideal weight.). Liver Function Tests: Recent Labs  Lab 07/15/22 0420 07/16/22 0425 07/17/22 0402  AST 24 13* 14*  ALT '16 12 13  '$ ALKPHOS 75 58 58  BILITOT 0.6 0.5 0.4  PROT 9.0* 7.2 7.6  ALBUMIN 4.1 3.1* 3.4*   Recent Labs  Lab 07/15/22 0420  LIPASE 30   No results for input(s): "AMMONIA" in the last 168 hours. Coagulation Profile: No results for input(s): "INR", "PROTIME" in the last 168 hours. Cardiac Enzymes: No results for input(s): "CKTOTAL", "CKMB", "CKMBINDEX", "TROPONINI" in the last 168 hours. BNP (last 3 results) No results for input(s): "PROBNP" in the last 8760 hours. HbA1C: No results for input(s): "HGBA1C" in the last 72 hours. CBG: Recent Labs  Lab 07/15/22 0401 07/16/22 1549  GLUCAP 197* 81   Lipid Profile: No results for input(s): "CHOL", "HDL", "LDLCALC", "TRIG", "CHOLHDL", "LDLDIRECT" in the last 72 hours. Thyroid Function Tests: No results for input(s): "TSH", "T4TOTAL", "FREET4", "T3FREE", "THYROIDAB" in the last 72 hours. Anemia Panel: No results for input(s): "VITAMINB12", "FOLATE", "FERRITIN", "TIBC", "IRON", "RETICCTPCT" in the last 72 hours. Sepsis Labs: Recent Labs  Lab 07/15/22 0420 07/15/22 0756  07/15/22 1147 07/15/22 1304  LATICACIDVEN 3.1* 3.1* 1.4 1.2    No results found for this or any previous visit (from the past 240 hour(s)).       Radiology Studies: DG Abd Portable 1V  Result Date: 07/16/2022 CLINICAL DATA:  Small-bowel obstruction EXAM: PORTABLE ABDOMEN - 1 VIEW COMPARISON:  CT 07/15/2022 FINDINGS: There are persistent mildly dilated small bowel loops in the lower abdomen. Degree of distention is similar to recent CT. There is mild gaseous distention of the colon. Bilateral nephroureteral stents are in place. Pelvic phleboliths. Prior lumbar fusion. IMPRESSION: Persistent mildly dilated loops of small bowel in the lower abdomen, compatible with obstruction as seen on recent CT. Electronically Signed   By: Maurine Simmering M.D.   On: 07/16/2022 09:16        Scheduled Meds:  bisoprolol  10 mg Oral Daily   celecoxib  100 mg Oral BID   DULoxetine  120 mg Oral Daily   enoxaparin (LOVENOX) injection  40 mg Subcutaneous Q24H   levETIRAcetam  250 mg Oral BID   levothyroxine  50 mcg Oral Q0600   oxybutynin  10 mg Oral Daily   pantoprazole  40 mg Oral Daily   pregabalin  225 mg Oral BID  LOS: 3 days    Time spent: 35 minutes    Doyne Ellinger Darleen Crocker, DO [facility]  If 7PM-7AM, please contact night-coverage [email or web address] 07/18/2022, 7:34 AM

## 2022-07-18 NOTE — Progress Notes (Signed)
[facility] Progress Note     Subjective: CC-  Up in chair. Overall still feeling better but has had no stool from ostomy. Passing small amount of flatus. Denies n/v and she is tolerating some clear liquids. States with her prior SBOs she is usually having more bowel function by this point.  Objective: Vital signs in last 24 hours: Temp:  [97.7 F (36.5 C)-97.8 F (36.6 C)] 97.7 F (36.5 C) (01/04 0605) Pulse Rate:  [52-61] 52 (01/04 0605) Resp:  [16-18] 16 (01/04 0605) BP: (106-158)/(72-99) 158/81 (01/04 0605) SpO2:  [97 %-100 %] 98 % (01/04 0605) Last BM Date : 07/16/22  Intake/Output from previous day: No intake/output data recorded. Intake/Output this shift: No intake/output data recorded.  PE: Gen:  Alert, NAD, pleasant Pulm:  rate and effort normal on room air Abd: Soft, nondistended, nontender, trace air but no stool in colostomy bag  Lab Results:  Recent Labs    07/17/22 0402 07/18/22 0338  WBC 7.5 6.6  HGB 10.7* 10.0*  HCT 35.8* 32.7*  PLT 308 284   BMET Recent Labs    07/17/22 0402 07/18/22 0338  NA 138 138  K 3.7 3.7  CL 107 109  CO2 22 23  GLUCOSE 106* 99  BUN 8 6  CREATININE 0.72 0.67  CALCIUM 9.1 9.0   PT/INR No results for input(s): "LABPROT", "INR" in the last 72 hours. CMP     Component Value Date/Time   NA 138 07/18/2022 0338   NA 140 05/30/2017 1009   K 3.7 07/18/2022 0338   K 4.3 05/30/2017 1009   CL 109 07/18/2022 0338   CO2 23 07/18/2022 0338   CO2 25 05/30/2017 1009   GLUCOSE 99 07/18/2022 0338   GLUCOSE 97 05/30/2017 1009   BUN 6 07/18/2022 0338   BUN 17.7 05/30/2017 1009   CREATININE 0.67 07/18/2022 0338   CREATININE 0.71 07/01/2019 1339   CREATININE 0.7 05/30/2017 1009   CALCIUM 9.0 07/18/2022 0338   CALCIUM 9.7 05/30/2017 1009   PROT 7.6 07/17/2022 0402   PROT 7.2 05/30/2017 1009   ALBUMIN 3.4 (L) 07/17/2022 0402   ALBUMIN 3.8 05/30/2017 1009   AST 14 (L) 07/17/2022 0402   AST 14 (L) 07/01/2019  1339   AST 16 05/30/2017 1009   ALT 13 07/17/2022 0402   ALT 12 07/01/2019 1339   ALT 22 05/30/2017 1009   ALKPHOS 58 07/17/2022 0402   ALKPHOS 89 05/30/2017 1009   BILITOT 0.4 07/17/2022 0402   BILITOT 0.3 07/01/2019 1339   BILITOT 0.52 05/30/2017 1009   GFRNONAA >60 07/18/2022 0338   GFRNONAA >60 07/01/2019 1339   GFRAA >60 08/25/2019 1326   GFRAA >60 07/01/2019 1339   Lipase     Component Value Date/Time   LIPASE 30 07/15/2022 0420       Studies/Results: No results found.  Anti-infectives: Anti-infectives (From admission, onward)    None        Assessment/Plan Recurrent small bowel obstruction in the setting of numerous surgeries for lower rectal cancer - previously refused nasogastric tube decompression with small bowel protocol with Gastrografin or oral gastrografin. She is feeling better but having minimal bowel function. Agreeable to PO gastrograffin today with delayed film.   ID - none FEN - IVF, CLD VTE - lovenox Foley - none     I reviewed last 24 h vitals and pain scores, last 48 h intake and output, last 24 h labs and trends, and last 24 h imaging results.  LOS: 3 days    [facility] 07/18/2022, 9:08 AM Please see Amion for pager number during day hours 7:00am-4:30pm

## 2022-07-19 DIAGNOSIS — K56609 Unspecified intestinal obstruction, unspecified as to partial versus complete obstruction: Secondary | ICD-10-CM | POA: Diagnosis not present

## 2022-07-19 MED ORDER — POLYETHYLENE GLYCOL 3350 17 G PO PACK
17.0000 g | PACK | Freq: Two times a day (BID) | ORAL | Status: DC
  Administered 2022-07-19 – 2022-07-20 (×3): 17 g via ORAL
  Filled 2022-07-19 (×3): qty 1

## 2022-07-19 NOTE — Care Management Important Message (Signed)
Important Message  Patient Details IM Letter given. Name: Lindsay Little MRN: [id number] Date of Birth: [birth year]   Medicare Important Message Given:  Yes     Kerin Salen 07/19/2022, 10:10 AM

## 2022-07-19 NOTE — Plan of Care (Signed)
  Problem: Education: Goal: Knowledge of General Education information will improve Description: Including pain rating scale, medication(s)/side effects and non-pharmacologic comfort measures Outcome: Progressing   Problem: Health Behavior/Discharge Planning: Goal: Ability to manage health-related needs will improve Outcome: Progressing   Problem: Clinical Measurements: Goal: Ability to maintain clinical measurements within normal limits will improve Outcome: Progressing Goal: Will remain free from infection Outcome: Progressing Goal: Diagnostic test results will improve Outcome: Progressing   Problem: Elimination: Goal: Will not experience complications related to bowel motility Outcome: Progressing Goal: Will not experience complications related to urinary retention Outcome: Progressing   Problem: Pain Managment: Goal: General experience of comfort will improve Outcome: Progressing   Problem: Safety: Goal: Ability to remain free from injury will improve Outcome: Progressing   Problem: Skin Integrity: Goal: Risk for impaired skin integrity will decrease Outcome: Progressing

## 2022-07-19 NOTE — Progress Notes (Signed)
PROGRESS NOTE    Lindsay Little  VQM:[id number] DOB: [birth year] DOA: 07/15/2022 PCP: [name], MD   Brief Narrative:  57 y.o. female with medical history significant of Rectal cancer with history of hypertension, hyperlipidemia, osteomyelitis, lumbar discitis, chronic pain syndrome, class III obesity, GERD, mixed urinary incontinence, overactive bladder, IBS, hypophosphatemia, peripheral neuropathy due to chemotherapy, hydroureter, bilateral hydronephrosis, cavernous hemangioma of the liver, anemia chronic disease, chron's disease rectal cancer with history of resection/colostomy complicated by rectovaginal fistula and intra-abdominal abscesses, multiple episodes of SBO in the past was coming to the emergency department due to abdominal pain, nausea, numerous episodes of emesis and 2 episodes of loose stools followed by decreased output from ostomy for the past 2 days.She denied fever, chills, rhinorrhea, sore throat, wheezing or hemoptysis.  No chest pain, palpitations, diaphoresis, PND, orthopnea or pitting edema of the lower extremities.  No constipation, melena or hematochezia.  No flank pain, dysuria, frequency or hematuria.  No polyuria, polydipsia, polyphagia or blurred vision.   Pt admitted for SBO. General Surgery consulted    Assessment & Plan:   Principal Problem:   Small bowel obstruction (HCC) Active Problems:   Pure hypercholesterolemia   Rectal adenocarcinoma s/p LAR resection 05/25/2015   Hypertension   Colostomy in place [facility])   Hypophosphatemia   Hypothyroidism   GERD (gastroesophageal reflux disease)  Assessment and Plan:  Principal Problem:   Small bowel obstruction (HCC)-improving -CT findings of SBO with transition zone noted -General Surgery consulted. Pt declined NG. Recs for supportive care for now -Advance to clear liquid diet per general surgery 1/3 -Gastrografin study per general surgery 1/4 with noted contrast in the colon -Advancing diet to full  liquid 1/5 -MiraLAX twice daily added by general surgery 1/5   Active Problems:   Rectal adenocarcinoma s/p LAR resection 05/25/2015 Concerns for possible disease recurrence. Follow-up with oncology as an outpatient.     Colostomy in place [facility]) Showing resewn output now. Continue local care.     Pure hypercholesterolemia Resume rosuvastatin when able to tolerate diet reliably.     Hypertension BP currently stable  -Resumed home medications     Hypothyroidism Resumed home levothyroxine     GERD (gastroesophageal reflux disease) Pantoprazole 40 mg IVP every 24 hours.     Class 3 obesity Follow-up with PCP. Recommend diet/lifestyle modification   DVT prophylaxis:Lovenox Code Status: Full Family Communication: None at bedside Disposition Plan:  Status is: Inpatient Remains inpatient appropriate because: Need for IV meds.     Consultants:  GS   Procedures:  None   Antimicrobials:  None   Subjective: Patient seen and evaluated today with improved pain control noted.  She denies any further nausea or vomiting and is tolerating clear liquids as well as her usual medications.  Still not having any bowel movement.  No recurrent abdominal pain noted.  Objective: Vitals:   07/18/22 0605 07/18/22 1303 07/18/22 2125 07/19/22 0544  BP: (!) 158/81 (!) 161/106 139/83 127/69  Pulse: (!) 52 (!) 57 (!) 50 (!) 43  Resp: '16 16 16 20  '$ Temp: 97.7 F (36.5 C) 98 F (36.7 C) 97.8 F (36.6 C) 97.6 F (36.4 C)  TempSrc: Oral     SpO2: 98% 97% 100% 97%    Intake/Output Summary (Last 24 hours) at 07/19/2022 0946 Last data filed at 07/19/2022 9326 Gross per 24 hour  Intake 120 ml  Output --  Net 120 ml   There were no vitals filed for this visit.  Examination:  General exam: Appears calm and comfortable  Respiratory system: Clear to auscultation. Respiratory effort normal. Cardiovascular system: S1 & S2 heard, RRR.  Gastrointestinal system: Abdomen is soft, ostomy bag with  trace air but no stool Central nervous system: Alert and awake Extremities: No edema Skin: No significant lesions noted Psychiatry: Flat affect.    Data Reviewed: I have personally reviewed following labs and imaging studies  CBC: Recent Labs  Lab 07/15/22 0420 07/16/22 0425 07/17/22 0402 07/18/22 0338  WBC 15.4* 10.0 7.5 6.6  NEUTROABS 13.1*  --   --   --   HGB 14.0 10.6* 10.7* 10.0*  HCT 45.2 36.3 35.8* 32.7*  MCV 81.7 88.3 85.9 84.3  PLT 481* 200 308 202    Basic Metabolic Panel: Recent Labs  Lab 07/15/22 0420 07/16/22 0425 07/17/22 0402 07/18/22 0338  NA 136 137 138 138  K 3.9 3.9 3.7 3.7  CL 104 110 107 109  CO2 18* 19* 22 23  GLUCOSE 202* 114* 106* 99  BUN '14 10 8 6  '$ CREATININE 0.96 0.69 0.72 0.67  CALCIUM 10.5* 8.5* 9.1 9.0  MG 1.7  --  1.8 1.9  PHOS <1.0* 2.6  --   --     GFR: CrCl cannot be calculated (Unknown ideal weight.). Liver Function Tests: Recent Labs  Lab 07/15/22 0420 07/16/22 0425 07/17/22 0402  AST 24 13* 14*  ALT '16 12 13  '$ ALKPHOS 75 58 58  BILITOT 0.6 0.5 0.4  PROT 9.0* 7.2 7.6  ALBUMIN 4.1 3.1* 3.4*    Recent Labs  Lab 07/15/22 0420  LIPASE 30    No results for input(s): "AMMONIA" in the last 168 hours. Coagulation Profile: No results for input(s): "INR", "PROTIME" in the last 168 hours. Cardiac Enzymes: No results for input(s): "CKTOTAL", "CKMB", "CKMBINDEX", "TROPONINI" in the last 168 hours. BNP (last 3 results) No results for input(s): "PROBNP" in the last 8760 hours. HbA1C: No results for input(s): "HGBA1C" in the last 72 hours. CBG: Recent Labs  Lab 07/15/22 0401 07/16/22 1549  GLUCAP 197* 81    Lipid Profile: No results for input(s): "CHOL", "HDL", "LDLCALC", "TRIG", "CHOLHDL", "LDLDIRECT" in the last 72 hours. Thyroid Function Tests: No results for input(s): "TSH", "T4TOTAL", "FREET4", "T3FREE", "THYROIDAB" in the last 72 hours. Anemia Panel: No results for input(s): "VITAMINB12", "FOLATE",  "FERRITIN", "TIBC", "IRON", "RETICCTPCT" in the last 72 hours. Sepsis Labs: Recent Labs  Lab 07/15/22 0420 07/15/22 0756 07/15/22 1147 07/15/22 1304  LATICACIDVEN 3.1* 3.1* 1.4 1.2     No results found for this or any previous visit (from the past 240 hour(s)).       Radiology Studies: DG Abd Portable 1V-Small Bowel Obstruction Protocol-initial, 8 hr delay  Result Date: 07/18/2022 CLINICAL DATA:  Small-bowel obstruction EXAM: PORTABLE ABDOMEN - 1 VIEW COMPARISON:  X-ray 07/16/2022.  CT 07/15/2022 FINDINGS: Fixation hardware of the lower lumbar spine with curvature of the spine and degenerative changes. Bilateral indwelling ureteral stents in place as on prior. There is contrast seen scattered in the right side of the colon and transverse colon. Gas is seen in nondilated loops of small large bowel. No frank obstruction. No definite free air on these portable supine radiographs. Well rounded densities are noted in the pelvis which are indeterminate although possibly vascular. IMPRESSION: Decreasing small bowel distention. Contrast seen along nondilated loops of colon. Electronically Signed   By: Jill Side M.D.   On: 07/18/2022 17:54        Scheduled Meds:  bisoprolol  10 mg Oral Daily   celecoxib  100 mg Oral BID   DULoxetine  120 mg Oral Daily   enoxaparin (LOVENOX) injection  40 mg Subcutaneous Q24H   levETIRAcetam  250 mg Oral BID   levothyroxine  50 mcg Oral Q0600   oxybutynin  10 mg Oral Daily   pantoprazole  40 mg Oral Daily   polyethylene glycol  17 g Oral BID   pregabalin  225 mg Oral BID     LOS: 4 days    Time spent: 35 minutes    Darious Rehman Darleen Crocker, DO [facility]  If 7PM-7AM, please contact night-coverage [email or web address] 07/19/2022, 9:46 AM

## 2022-07-19 NOTE — Progress Notes (Signed)
[facility] Progress Note     Subjective: CC-  Feeling fine this morning. No recurrent abdominal pain, nausea, or vomiting. Tolerating clear liquids. Still only having small amount of flatus from ostomy, no stool. Xray shows contrast in the colon and decreased dilated small intestine.  Objective: Vital signs in last 24 hours: Temp:  [97.6 F (36.4 C)-98 F (36.7 C)] 97.6 F (36.4 C) (01/05 0544) Pulse Rate:  [43-57] 43 (01/05 0544) Resp:  [16-20] 20 (01/05 0544) BP: (127-161)/(69-106) 127/69 (01/05 0544) SpO2:  [97 %-100 %] 97 % (01/05 0544) Last BM Date : 07/16/22  Intake/Output from previous day: No intake/output data recorded. Intake/Output this shift: No intake/output data recorded.  PE: Gen:  Alert, NAD, pleasant Pulm:  rate and effort normal on room air Abd: Soft, nondistended, nontender, trace air but no stool in colostomy bag  Lab Results:  Recent Labs    07/17/22 0402 07/18/22 0338  WBC 7.5 6.6  HGB 10.7* 10.0*  HCT 35.8* 32.7*  PLT 308 284   BMET Recent Labs    07/17/22 0402 07/18/22 0338  NA 138 138  K 3.7 3.7  CL 107 109  CO2 22 23  GLUCOSE 106* 99  BUN 8 6  CREATININE 0.72 0.67  CALCIUM 9.1 9.0   PT/INR No results for input(s): "LABPROT", "INR" in the last 72 hours. CMP     Component Value Date/Time   NA 138 07/18/2022 0338   NA 140 05/30/2017 1009   K 3.7 07/18/2022 0338   K 4.3 05/30/2017 1009   CL 109 07/18/2022 0338   CO2 23 07/18/2022 0338   CO2 25 05/30/2017 1009   GLUCOSE 99 07/18/2022 0338   GLUCOSE 97 05/30/2017 1009   BUN 6 07/18/2022 0338   BUN 17.7 05/30/2017 1009   CREATININE 0.67 07/18/2022 0338   CREATININE 0.71 07/01/2019 1339   CREATININE 0.7 05/30/2017 1009   CALCIUM 9.0 07/18/2022 0338   CALCIUM 9.7 05/30/2017 1009   PROT 7.6 07/17/2022 0402   PROT 7.2 05/30/2017 1009   ALBUMIN 3.4 (L) 07/17/2022 0402   ALBUMIN 3.8 05/30/2017 1009   AST 14 (L) 07/17/2022 0402   AST 14 (L) 07/01/2019 1339    AST 16 05/30/2017 1009   ALT 13 07/17/2022 0402   ALT 12 07/01/2019 1339   ALT 22 05/30/2017 1009   ALKPHOS 58 07/17/2022 0402   ALKPHOS 89 05/30/2017 1009   BILITOT 0.4 07/17/2022 0402   BILITOT 0.3 07/01/2019 1339   BILITOT 0.52 05/30/2017 1009   GFRNONAA >60 07/18/2022 0338   GFRNONAA >60 07/01/2019 1339   GFRAA >60 08/25/2019 1326   GFRAA >60 07/01/2019 1339   Lipase     Component Value Date/Time   LIPASE 30 07/15/2022 0420       Studies/Results: DG Abd Portable 1V-Small Bowel Obstruction Protocol-initial, 8 hr delay  Result Date: 07/18/2022 CLINICAL DATA:  Small-bowel obstruction EXAM: PORTABLE ABDOMEN - 1 VIEW COMPARISON:  X-ray 07/16/2022.  CT 07/15/2022 FINDINGS: Fixation hardware of the lower lumbar spine with curvature of the spine and degenerative changes. Bilateral indwelling ureteral stents in place as on prior. There is contrast seen scattered in the right side of the colon and transverse colon. Gas is seen in nondilated loops of small large bowel. No frank obstruction. No definite free air on these portable supine radiographs. Well rounded densities are noted in the pelvis which are indeterminate although possibly vascular. IMPRESSION: Decreasing small bowel distention. Contrast seen along nondilated loops of colon. Electronically  Signed   By: Jill Side M.D.   On: 07/18/2022 17:54    Anti-infectives: Anti-infectives (From admission, onward)    None        Assessment/Plan Recurrent small bowel obstruction in the setting of numerous surgeries for lower rectal cancer - S/p SBO protocol with oral gastrograffin: delayed film shows contrast in colon and decreasing small bowel distension - Tolerating clear liquids. Advance to full liquids and add miralax BID.  ID - none FEN - IVF, FLD VTE - lovenox Foley - none     I reviewed last 24 h vitals and pain scores, last 48 h intake and output, last 24 h labs and trends, and last 24 h imaging results.    LOS: 4  days    [facility] 07/19/2022, 7:21 AM Please see Amion for pager number during day hours 7:00am-4:30pm

## 2022-07-20 DIAGNOSIS — K56609 Unspecified intestinal obstruction, unspecified as to partial versus complete obstruction: Secondary | ICD-10-CM | POA: Diagnosis not present

## 2022-07-20 LAB — BASIC METABOLIC PANEL
Anion gap: 10 (ref 5–15)
BUN: 8 mg/dL (ref 6–20)
CO2: 23 mmol/L (ref 22–32)
Calcium: 9.2 mg/dL (ref 8.9–10.3)
Chloride: 106 mmol/L (ref 98–111)
Creatinine, Ser: 0.83 mg/dL (ref 0.44–1.00)
GFR, Estimated: >60 mL/min (ref >60)
Glucose, Bld: 87 mg/dL (ref 70–99)
Potassium: 3.8 mmol/L (ref 3.5–5.1)
Sodium: 139 mmol/L (ref 135–145)

## 2022-07-20 LAB — MAGNESIUM: Magnesium: 2 mg/dL (ref 1.7–2.4)

## 2022-07-20 MED ORDER — ONDANSETRON HCL 4 MG PO TABS: 4.0000 mg | ORAL_TABLET | Freq: Four times a day (QID) | ORAL | 0 refills | Status: DC | PRN

## 2022-07-20 NOTE — Discharge Summary (Signed)
Physician Discharge Summary  Lindsay Little UXL:[id number] DOB: June 20, 1966 DOA: 07/15/2022  PCP: [name], MD  Admit date: 07/15/2022  Discharge date: 07/20/2022  Admitted From:Home  Disposition:  Home  Recommendations for Outpatient Follow-up:  Follow up with PCP in 1-2 weeks Continue MiraLAX daily as prior as well as other home medications  Home Health: None  Equipment/Devices: None  Discharge Condition:Stable  CODE STATUS: Full  Diet recommendation: Heart Healthy  Brief/Interim Summary: 57 y.o. female with medical history significant of Rectal cancer with history of hypertension, hyperlipidemia, osteomyelitis, lumbar discitis, chronic pain syndrome, class III obesity, GERD, mixed urinary incontinence, overactive bladder, IBS, hypophosphatemia, peripheral neuropathy due to chemotherapy, hydroureter, bilateral hydronephrosis, cavernous hemangioma of the liver, anemia chronic disease, chron's disease rectal cancer with history of resection/colostomy complicated by rectovaginal fistula and intra-abdominal abscesses, multiple episodes of SBO in the past was coming to the emergency department due to abdominal pain, nausea, numerous episodes of emesis and 2 episodes of loose stools followed by decreased output from ostomy for the past 2 days.She denied fever, chills, rhinorrhea, sore throat, wheezing or hemoptysis.  No chest pain, palpitations, diaphoresis, PND, orthopnea or pitting edema of the lower extremities.  No constipation, melena or hematochezia.  No flank pain, dysuria, frequency or hematuria.  No polyuria, polydipsia, polyphagia or blurred vision.   -Patient was admitted for findings of small bowel obstruction and underwent conservative management with general surgery following along.  She declined NG tube and eventually was able to tolerate diet with no further nausea or vomiting.  She is in stable condition for discharge today per general surgery as she is tolerating a regular  diet and is now noted to have some liquid output per her colostomy bag and no further abdominal pain.  No other significant events or concerns noted throughout the course of this hospitalization.  Discharge Diagnoses:  Principal Problem:   Small bowel obstruction ([place]) Active Problems:   Pure hypercholesterolemia   Rectal adenocarcinoma s/p LAR resection 05/25/2015   Hypertension   Colostomy in place [facility])   Hypophosphatemia   Hypothyroidism   GERD (gastroesophageal reflux disease)  Principal discharge diagnosis: Recurrent small bowel obstruction in the setting of numerous surgeries for lower rectal cancer.  Discharge Instructions  Discharge Instructions     Diet - low sodium heart healthy   Complete by: As directed    Increase activity slowly   Complete by: As directed       Allergies as of 07/20/2022       Reactions   Caine-1 [lidocaine] Swelling, Rash   Eyes swell shut; includes all caine drugs except marcaine. EMLA cream OK though (?!)   Bupropion Other (See Comments)   Not effective per Pt.    Sulfa Antibiotics Nausea And Vomiting, Rash   Pantoprazole Other (See Comments)   Not effective per Pt.    Adhesive [tape] Rash, Other (See Comments)   Blisters - can use paper tape   Doxycycline Nausea Only   Flagyl [metronidazole] Nausea Only   Iron Nausea Only   Metoprolol Nausea Only, Palpitations   Oxycodone Other (See Comments)   NIGHTMARES. (tolerates hydrocodone or tramadol better)   Penicillins Nausea Only, Rash   Has patient had a PCN reaction causing immediate rash, facial/tongue/throat swelling, SOB or lightheadedness with hypotension: no Has patient had a PCN reaction causing severe rash involving mucus membranes or skin necrosis: no Has patient had a PCN reaction that required hospitalization no Has patient had a PCN reaction occurring within the  last 10 years: no If all of the above answers are "NO", then may proceed with Cephalosporin use.         Medication List     TAKE these medications    acetaminophen 500 MG tablet Commonly known as: TYLENOL Take 1,000 mg by mouth every 8 (eight) hours as needed for moderate pain.   bisoprolol 10 MG tablet Commonly known as: ZEBETA Take 10 mg by mouth daily.   celecoxib 100 MG capsule Commonly known as: CELEBREX Take 100 mg by mouth 2 (two) times daily.   CoQ10 200 MG Caps Take 200 mg by mouth at bedtime.   DULoxetine 60 MG capsule Commonly known as: CYMBALTA Take 120 mg by mouth daily.   esomeprazole 40 MG capsule Commonly known as: NEXIUM Take 1 capsule (40 mg total) by mouth 2 (two) times daily before a meal.   furosemide 20 MG tablet Commonly known as: LASIX Take 20 mg by mouth daily as needed for edema.   HYDROmorphone HCl 8 MG Tb24 Commonly known as: EXALGO Take 1 tablet (8 mg total) by mouth daily. MAX IS ONE TABLET A DAY   HYDROmorphone 4 MG tablet Commonly known as: DILAUDID Take 1 tablet (4 mg total) by mouth every 6 (six) hours as needed for pain   ibuprofen 200 MG tablet Commonly known as: ADVIL Take 200 mg by mouth every 6 (six) hours as needed for mild pain.   levETIRAcetam 250 MG tablet Commonly known as: KEPPRA Take 250 mg by mouth 2 (two) times daily.   levothyroxine 50 MCG tablet Commonly known as: SYNTHROID Take 50 mcg by mouth every morning.   melatonin 3 MG Tabs tablet Take 3-6 mg by mouth at bedtime as needed (sleep).   morphine 15 MG 12 hr tablet Commonly known as: MS CONTIN Take 15 mg by mouth 2 (two) times daily.   multivitamin with minerals Tabs tablet Take 2 tablets by mouth at bedtime.   ondansetron 4 MG tablet Commonly known as: ZOFRAN Take 1 tablet (4 mg total) by mouth every 6 (six) hours as needed for nausea.   oxybutynin 10 MG 24 hr tablet Commonly known as: DITROPAN-XL Take 10 mg by mouth daily.   polyethylene glycol 17 g packet Commonly known as: MIRALAX / GLYCOLAX Take 17 g by mouth daily.   pregabalin 225 MG  capsule Commonly known as: LYRICA Take 225 mg by mouth 2 (two) times daily.   promethazine 25 MG tablet Commonly known as: PHENERGAN TAKE ONE TABLET BY MOUTH EVERY 6 HOURS AS NEEDED FOR NAUSEA OR VOMITING What changed: See the new instructions.   rosuvastatin 5 MG tablet Commonly known as: CRESTOR Take 5 mg by mouth at bedtime.   triamcinolone lotion 0.1 % Commonly known as: KENALOG Apply 1 application topically as needed (dry skin).   Vitamin D3 125 MCG (5000 UT) Caps Take 5,000 Units by mouth at bedtime.   Xtampza ER 9 MG C12a Generic drug: oxyCODONE ER Take 9 mg by mouth 2 (two) times daily.        Follow-up Information     [name], MD. Schedule an appointment as soon as possible for a visit in 1 week(s).   Specialty: Family Medicine Contact information: [address], [address] [place] [address] [phone]                Allergies  Allergen Reactions   Caine-1 [Lidocaine] Swelling and Rash    Eyes swell shut; includes all caine drugs  except marcaine. EMLA cream OK though (?!)   Bupropion Other (See Comments)    Not effective per Pt.    Sulfa Antibiotics Nausea And Vomiting and Rash   Pantoprazole Other (See Comments)    Not effective per Pt.    Adhesive [Tape] Rash and Other (See Comments)    Blisters - can use paper tape   Doxycycline Nausea Only   Flagyl [Metronidazole] Nausea Only   Iron Nausea Only   Metoprolol Nausea Only and Palpitations   Oxycodone Other (See Comments)    NIGHTMARES. (tolerates hydrocodone or tramadol better)   Penicillins Nausea Only and Rash    Has patient had a PCN reaction causing immediate rash, facial/tongue/throat swelling, SOB or lightheadedness with hypotension: no Has patient had a PCN reaction causing severe rash involving mucus membranes or skin necrosis: no Has patient had a PCN reaction that required hospitalization no Has patient had a PCN reaction occurring within the last 10 years: no If  all of the above answers are "NO", then may proceed with Cephalosporin use.     Consultations: General surgery   Procedures/Studies: DG Abd Portable 1V-Small Bowel Obstruction Protocol-initial, 8 hr delay  Result Date: 07/18/2022 CLINICAL DATA:  Small-bowel obstruction EXAM: PORTABLE ABDOMEN - 1 VIEW COMPARISON:  X-ray 07/16/2022.  CT 07/15/2022 FINDINGS: Fixation hardware of the lower lumbar spine with curvature of the spine and degenerative changes. Bilateral indwelling ureteral stents in place as on prior. There is contrast seen scattered in the right side of the colon and transverse colon. Gas is seen in nondilated loops of small large bowel. No frank obstruction. No definite free air on these portable supine radiographs. Well rounded densities are noted in the pelvis which are indeterminate although possibly vascular. IMPRESSION: Decreasing small bowel distention. Contrast seen along nondilated loops of colon. Electronically Signed   By: Jill Side M.D.   On: 07/18/2022 17:54   DG Abd Portable 1V  Result Date: 07/16/2022 CLINICAL DATA:  Small-bowel obstruction EXAM: PORTABLE ABDOMEN - 1 VIEW COMPARISON:  CT 07/15/2022 FINDINGS: There are persistent mildly dilated small bowel loops in the lower abdomen. Degree of distention is similar to recent CT. There is mild gaseous distention of the colon. Bilateral nephroureteral stents are in place. Pelvic phleboliths. Prior lumbar fusion. IMPRESSION: Persistent mildly dilated loops of small bowel in the lower abdomen, compatible with obstruction as seen on recent CT. Electronically Signed   By: Maurine Simmering M.D.   On: 07/16/2022 09:16   CT ABDOMEN PELVIS W CONTRAST  Result Date: 07/15/2022 CLINICAL DATA:  Abdominal pain, nausea and vomiting, with colostomy and history of rectal cancer with resection and recurrence August 2023. EXAM: CT ABDOMEN AND PELVIS WITH CONTRAST TECHNIQUE: Multidetector CT imaging of the abdomen and pelvis was performed using the  standard protocol following bolus administration of intravenous contrast. RADIATION DOSE REDUCTION: This exam was performed according to the departmental dose-optimization program which includes automated exposure control, adjustment of the mA and/or kV according to patient size and/or use of iterative reconstruction technique. CONTRAST:  148m OMNIPAQUE IOHEXOL 300 MG/ML  SOLN COMPARISON:  PET-CT 03/06/2022, CT without contrast 02/15/22. FINDINGS: Lower chest: No acute abnormality. Hepatobiliary: Hepatic steatosis. No mass enhancement. Unremarkable gallbladder and bile ducts. Pancreas: Partially atrophic.  No other abnormality. Spleen: Mildly prominent, 13.6 cm coronal.  No mass. Adrenals/Urinary Tract: No adrenal or renal mass is seen. There is fetal lobation of both kidneys, scattered cortical scarring on the left. Interval bilateral ureteral stent insertions decompression of the renal  collecting systems. The bladder is contracted and not well seen but was previously thickened on both prior CTs. Stomach/Bowel: Unremarkable gastric wall. Dilatation of upper to mid abdominal small bowel primarily due to the left is again noted up to 4.4 cm. Similar findings were noted on 02/15/2022 but the bowel had decompressed as of 03/06/2022. Left lower quadrant transition suspected on axial images 64-71 of series 2 and etiology most likely due to adhesions, with an angulated partially feces filled segment in the area of transition. Remaining small bowel is normal caliber. The large bowel wall is largely contracted. An appendix is not seen. There is a descending colostomy and prior abdominal perineal resection. No bowel pneumatosis is seen. Vascular/Lymphatic: There is mild aortic calcific plaque. Again noted are slightly prominent left common iliac chain nodes up to 1 cm in short axis, at least 1 of which showed productivity There are few bilateral borderline sized inguinal chain nodes. One of these on the left demonstrated PET  activity previously, specifically on series 2 axial 98. There is interval enlargement of right lower quadrant mesenteric nodes which could be metastatic. Largest of these is 1.4 cm in short axis on 2:70. No other significant lymph nodes are seen. Reproductive: The uterus is absent. Left adnexal abutting cystic structures up to 2.4 cm are unchanged. Other: Presacral soft tissue thickening extending down to the posterior pelvic floor, strongly PET positive, is again noted. The greatest diameter of this is 8.3 x 6.1 cm on 2:87. It is not significantly changed in overall size. There is an air-fluid collection within this at the level of the coccyx and just above the pelvic floor measuring 4.9 x 1.8 cm. This was also seen previously with scattered air pockets in the soft tissue above this level, unknown if this represents small-bowel engulfed by tumor or whether there could be a small bowel fistula to the area with infectious process, but this is similar in appearance to the previous studies. Musculoskeletal: There is levoscoliosis, degenerative and postsurgical changes of the lumbar spine with L4-5 posterior rods and pedicle screws and interbody metal hardware, advanced disc collapse at L3-4. No destructive bone lesion is seen. IMPRESSION: 1. Small-bowel obstruction, at least of intermediate-grade, with transition point in the left lower quadrant, most likely due to adhesions. 2. Abdominal perineal resection for rectal carcinoma with left upper abdominal descending colostomy. 3. Presacral soft tissue thickening consistent with recurrent tumor and PET positive, extending down to the posterior pelvic floor, not significantly changed in overall size. 4. There is an air-fluid collection within this at the level of the coccyx and just above the pelvic floor measuring 4.9 x 1.8 cm. This was also seen previously with scattered air pockets in the soft tissue above this level, unknown if this represents small-bowel engulfed by  tumor or whether there could be a small bowel fistula to the area with infectious process, but this is similar in appearance to the previous studies. 5. Interval enlargement of right lower quadrant mesenteric nodes which could be metastatic. 6. Interval bilateral ureteral stent placement with decompression of the renal collecting systems. 7. Hepatic steatosis and mild splenomegaly. 8. Aortic atherosclerosis. Aortic Atherosclerosis (ICD10-I70.0). Electronically Signed   By: Telford Nab M.D.   On: 07/15/2022 07:06     Discharge Exam: Vitals:   07/19/22 2145 07/20/22 0516  BP: 130/76   Pulse: (!) 51   Resp: 16 17  Temp: 97.7 F (36.5 C) 97.7 F (36.5 C)  SpO2: 98%    Vitals:  07/19/22 0544 07/19/22 1339 07/19/22 2145 07/20/22 0516  BP: 127/69 (!) 157/84 130/76   Pulse: (!) 43 65 (!) 51   Resp: '20 19 16 17  '$ Temp: 97.6 F (36.4 C) 97.8 F (36.6 C) 97.7 F (36.5 C) 97.7 F (36.5 C)  TempSrc:   Oral Oral  SpO2: 97% 98% 98%     General: Pt is alert, awake, not in acute distress Cardiovascular: RRR, S1/S2 +, no rubs, no gallops Respiratory: CTA bilaterally, no wheezing, no rhonchi Abdominal: Soft, NT, ND, bowel sounds + colostomy with air and liquid output Extremities: no edema, no cyanosis    The results of significant diagnostics from this hospitalization (including imaging, microbiology, ancillary and laboratory) are listed below for reference.     Microbiology: No results found for this or any previous visit (from the past 240 hour(s)).   Labs: BNP (last 3 results) No results for input(s): "BNP" in the last 8760 hours. Basic Metabolic Panel: Recent Labs  Lab 07/15/22 0420 07/16/22 0425 07/17/22 0402 07/18/22 0338 07/20/22 0441  NA 136 137 138 138 139  K 3.9 3.9 3.7 3.7 3.8  CL 104 110 107 109 106  CO2 18* 19* '22 23 23  '$ GLUCOSE 202* 114* 106* 99 87  BUN '14 10 8 6 8  '$ CREATININE 0.96 0.69 0.72 0.67 0.83  CALCIUM 10.5* 8.5* 9.1 9.0 9.2  MG 1.7  --  1.8 1.9 2.0   PHOS <1.0* 2.6  --   --   --    Liver Function Tests: Recent Labs  Lab 07/15/22 0420 07/16/22 0425 07/17/22 0402  AST 24 13* 14*  ALT '16 12 13  '$ ALKPHOS 75 58 58  BILITOT 0.6 0.5 0.4  PROT 9.0* 7.2 7.6  ALBUMIN 4.1 3.1* 3.4*   Recent Labs  Lab 07/15/22 0420  LIPASE 30   No results for input(s): "AMMONIA" in the last 168 hours. CBC: Recent Labs  Lab 07/15/22 0420 07/16/22 0425 07/17/22 0402 07/18/22 0338  WBC 15.4* 10.0 7.5 6.6  NEUTROABS 13.1*  --   --   --   HGB 14.0 10.6* 10.7* 10.0*  HCT 45.2 36.3 35.8* 32.7*  MCV 81.7 88.3 85.9 84.3  PLT 481* 200 308 284   Cardiac Enzymes: No results for input(s): "CKTOTAL", "CKMB", "CKMBINDEX", "TROPONINI" in the last 168 hours. BNP: Invalid input(s): "POCBNP" CBG: Recent Labs  Lab 07/15/22 0401 07/16/22 1549  GLUCAP 197* 81   D-Dimer No results for input(s): "DDIMER" in the last 72 hours. Hgb A1c No results for input(s): "HGBA1C" in the last 72 hours. Lipid Profile No results for input(s): "CHOL", "HDL", "LDLCALC", "TRIG", "CHOLHDL", "LDLDIRECT" in the last 72 hours. Thyroid function studies No results for input(s): "TSH", "T4TOTAL", "T3FREE", "THYROIDAB" in the last 72 hours.  Invalid input(s): "FREET3" Anemia work up No results for input(s): "VITAMINB12", "FOLATE", "FERRITIN", "TIBC", "IRON", "RETICCTPCT" in the last 72 hours. Urinalysis    Component Value Date/Time   COLORURINE AMBER (A) 10/13/2020 1521   APPEARANCEUR CLEAR 10/13/2020 1521   LABSPEC 1.041 (H) 10/13/2020 1521   LABSPEC 1.010 02/26/2016 1604   PHURINE 5.0 10/13/2020 1521   GLUCOSEU NEGATIVE 10/13/2020 1521   GLUCOSEU Negative 02/26/2016 1604   HGBUR LARGE (A) 10/13/2020 1521   BILIRUBINUR NEGATIVE 10/13/2020 1521   BILIRUBINUR Negative 02/26/2016 1604   KETONESUR 5 (A) 10/13/2020 1521   PROTEINUR 30 (A) 10/13/2020 1521   UROBILINOGEN 0.2 02/26/2016 1604   NITRITE NEGATIVE 10/13/2020 1521   LEUKOCYTESUR MODERATE (A) 10/13/2020 1521  LEUKOCYTESUR Trace 02/26/2016 1604   Sepsis Labs Recent Labs  Lab 07/15/22 0420 07/16/22 0425 07/17/22 0402 07/18/22 0338  WBC 15.4* 10.0 7.5 6.6   Microbiology No results found for this or any previous visit (from the past 240 hour(s)).   Time coordinating discharge: 35 minutes  SIGNED:   Rodena Goldmann, DO [facility] 07/20/2022, 10:12 AM  If 7PM-7AM, please contact night-coverage [email or web address]

## 2022-07-20 NOTE — Plan of Care (Signed)

## 2022-07-20 NOTE — Progress Notes (Signed)
[facility] Progress Note     Subjective: CC-  Feeling fine this morning. No recurrent abdominal pain, nausea, or vomiting. Tolerating full liquids. Still only having small amount of flatus from ostomy, as well as some liquid stool. Xray shows contrast in the colon and decreased dilated small intestine.  Objective: Vital signs in last 24 hours: Temp:  [97.7 F (36.5 C)-97.8 F (36.6 C)] 97.7 F (36.5 C) (01/06 0516) Pulse Rate:  [51-65] 51 (01/05 2145) Resp:  [16-19] 17 (01/06 0516) BP: (130-157)/(76-84) 130/76 (01/05 2145) SpO2:  [98 %] 98 % (01/05 2145) Last BM Date : 07/19/22  Intake/Output from previous day: 01/05 0701 - 01/06 0700 In: 340 [P.O.:340] Out: -  Intake/Output this shift: No intake/output data recorded.  PE: Gen:  Alert, NAD, pleasant Pulm:  rate and effort normal on room air Abd: Soft, nondistended, nontender, trace air but no stool in colostomy bag  Lab Results:  Recent Labs    07/18/22 0338  WBC 6.6  HGB 10.0*  HCT 32.7*  PLT 284    BMET Recent Labs    07/18/22 0338 07/20/22 0441  NA 138 139  K 3.7 3.8  CL 109 106  CO2 23 23  GLUCOSE 99 87  BUN 6 8  CREATININE 0.67 0.83  CALCIUM 9.0 9.2    PT/INR No results for input(s): "LABPROT", "INR" in the last 72 hours. CMP     Component Value Date/Time   NA 139 07/20/2022 0441   NA 140 05/30/2017 1009   K 3.8 07/20/2022 0441   K 4.3 05/30/2017 1009   CL 106 07/20/2022 0441   CO2 23 07/20/2022 0441   CO2 25 05/30/2017 1009   GLUCOSE 87 07/20/2022 0441   GLUCOSE 97 05/30/2017 1009   BUN 8 07/20/2022 0441   BUN 17.7 05/30/2017 1009   CREATININE 0.83 07/20/2022 0441   CREATININE 0.71 07/01/2019 1339   CREATININE 0.7 05/30/2017 1009   CALCIUM 9.2 07/20/2022 0441   CALCIUM 9.7 05/30/2017 1009   PROT 7.6 07/17/2022 0402   PROT 7.2 05/30/2017 1009   ALBUMIN 3.4 (L) 07/17/2022 0402   ALBUMIN 3.8 05/30/2017 1009   AST 14 (L) 07/17/2022 0402   AST 14 (L) 07/01/2019 1339    AST 16 05/30/2017 1009   ALT 13 07/17/2022 0402   ALT 12 07/01/2019 1339   ALT 22 05/30/2017 1009   ALKPHOS 58 07/17/2022 0402   ALKPHOS 89 05/30/2017 1009   BILITOT 0.4 07/17/2022 0402   BILITOT 0.3 07/01/2019 1339   BILITOT 0.52 05/30/2017 1009   GFRNONAA >60 07/20/2022 0441   GFRNONAA >60 07/01/2019 1339   GFRAA >60 08/25/2019 1326   GFRAA >60 07/01/2019 1339   Lipase     Component Value Date/Time   LIPASE 30 07/15/2022 0420       Studies/Results: DG Abd Portable 1V-Small Bowel Obstruction Protocol-initial, 8 hr delay  Result Date: 07/18/2022 CLINICAL DATA:  Small-bowel obstruction EXAM: PORTABLE ABDOMEN - 1 VIEW COMPARISON:  X-ray 07/16/2022.  CT 07/15/2022 FINDINGS: Fixation hardware of the lower lumbar spine with curvature of the spine and degenerative changes. Bilateral indwelling ureteral stents in place as on prior. There is contrast seen scattered in the right side of the colon and transverse colon. Gas is seen in nondilated loops of small large bowel. No frank obstruction. No definite free air on these portable supine radiographs. Well rounded densities are noted in the pelvis which are indeterminate although possibly vascular. IMPRESSION: Decreasing small bowel distention. Contrast seen  along nondilated loops of colon. Electronically Signed   By: Jill Side M.D.   On: 07/18/2022 17:54    Anti-infectives: Anti-infectives (From admission, onward)    None        Assessment/Plan Recurrent small bowel obstruction in the setting of numerous surgeries for lower rectal cancer - S/p SBO protocol with oral gastrograffin: delayed film shows contrast in colon and decreasing small bowel distension - Tolerating full liquids. Advance to reg diet and  miralax BID.  ID - none FEN - IVF, RD VTE - lovenox Foley - none     I reviewed last 24 h vitals and pain scores, last 48 h intake and output, last 24 h labs and trends, and last 24 h imaging results.    LOS: 5 days     Rosario Adie, [facility] 07/20/2022, 7:45 AM Please see Amion for pager number during day hours 7:00am-4:30pm

## 2022-07-20 NOTE — Plan of Care (Signed)
  Problem: Education: Goal: Knowledge of General Education information will improve Description: Including pain rating scale, medication(s)/side effects and non-pharmacologic comfort measures Outcome: Progressing   Problem: Health Behavior/Discharge Planning: Goal: Ability to manage health-related needs will improve Outcome: Progressing   Problem: Clinical Measurements: Goal: Ability to maintain clinical measurements within normal limits will improve Outcome: Progressing Goal: Will remain free from infection Outcome: Progressing   Problem: Activity: Goal: Risk for activity intolerance will decrease Outcome: Progressing   Problem: Nutrition: Goal: Adequate nutrition will be maintained Outcome: Progressing   Problem: Coping: Goal: Level of anxiety will decrease Outcome: Progressing   Problem: Pain Managment: Goal: General experience of comfort will improve Outcome: Progressing   Problem: Safety: Goal: Ability to remain free from injury will improve Outcome: Progressing   Problem: Skin Integrity: Goal: Risk for impaired skin integrity will decrease Outcome: Progressing   

## 2022-07-24 DIAGNOSIS — R7303 Prediabetes: Secondary | ICD-10-CM | POA: Diagnosis not present

## 2022-07-24 DIAGNOSIS — K56609 Unspecified intestinal obstruction, unspecified as to partial versus complete obstruction: Secondary | ICD-10-CM | POA: Diagnosis not present

## 2022-07-24 DIAGNOSIS — E559 Vitamin D deficiency, unspecified: Secondary | ICD-10-CM | POA: Diagnosis not present

## 2022-07-24 DIAGNOSIS — Z933 Colostomy status: Secondary | ICD-10-CM | POA: Diagnosis not present

## 2022-07-24 DIAGNOSIS — F3341 Major depressive disorder, recurrent, in partial remission: Secondary | ICD-10-CM | POA: Diagnosis not present

## 2022-07-24 DIAGNOSIS — D649 Anemia, unspecified: Secondary | ICD-10-CM | POA: Diagnosis not present

## 2022-07-24 DIAGNOSIS — R7309 Other abnormal glucose: Secondary | ICD-10-CM | POA: Diagnosis not present

## 2022-07-24 DIAGNOSIS — E039 Hypothyroidism, unspecified: Secondary | ICD-10-CM | POA: Diagnosis not present

## 2022-07-24 DIAGNOSIS — Z85048 Personal history of other malignant neoplasm of rectum, rectosigmoid junction, and anus: Secondary | ICD-10-CM | POA: Diagnosis not present

## 2022-07-27 ENCOUNTER — Encounter (HOSPITAL_BASED_OUTPATIENT_CLINIC_OR_DEPARTMENT_OTHER): Payer: Self-pay

## 2022-07-27 ENCOUNTER — Inpatient Hospital Stay (HOSPITAL_BASED_OUTPATIENT_CLINIC_OR_DEPARTMENT_OTHER)
Admission: EM | Admit: 2022-07-27 | Discharge: 2022-08-03 | DRG: 389 | Disposition: A | Discharge status: Home / Self Care | Payer: Medicare HMO | Attending: Internal Medicine | Admitting: Internal Medicine

## 2022-07-27 ENCOUNTER — Encounter (HOSPITAL_COMMUNITY): Payer: Self-pay

## 2022-07-27 ENCOUNTER — Other Ambulatory Visit: Payer: Self-pay

## 2022-07-27 ENCOUNTER — Emergency Department (HOSPITAL_BASED_OUTPATIENT_CLINIC_OR_DEPARTMENT_OTHER): Payer: Medicare HMO

## 2022-07-27 DIAGNOSIS — Z833 Family history of diabetes mellitus: Secondary | ICD-10-CM

## 2022-07-27 DIAGNOSIS — Z888 Allergy status to other drugs, medicaments and biological substances status: Secondary | ICD-10-CM

## 2022-07-27 DIAGNOSIS — K219 Gastro-esophageal reflux disease without esophagitis: Secondary | ICD-10-CM | POA: Diagnosis present

## 2022-07-27 DIAGNOSIS — Z8249 Family history of ischemic heart disease and other diseases of the circulatory system: Secondary | ICD-10-CM

## 2022-07-27 DIAGNOSIS — K565 Intestinal adhesions [bands], unspecified as to partial versus complete obstruction: Principal | ICD-10-CM | POA: Diagnosis present

## 2022-07-27 DIAGNOSIS — G40909 Epilepsy, unspecified, not intractable, without status epilepticus: Secondary | ICD-10-CM | POA: Diagnosis not present

## 2022-07-27 DIAGNOSIS — G894 Chronic pain syndrome: Secondary | ICD-10-CM | POA: Diagnosis present

## 2022-07-27 DIAGNOSIS — Z6841 Body Mass Index (BMI) 40.0 and over, adult: Secondary | ICD-10-CM | POA: Diagnosis not present

## 2022-07-27 DIAGNOSIS — K6389 Other specified diseases of intestine: Secondary | ICD-10-CM | POA: Diagnosis not present

## 2022-07-27 DIAGNOSIS — Z923 Personal history of irradiation: Secondary | ICD-10-CM

## 2022-07-27 DIAGNOSIS — Z933 Colostomy status: Secondary | ICD-10-CM | POA: Diagnosis not present

## 2022-07-27 DIAGNOSIS — Z85048 Personal history of other malignant neoplasm of rectum, rectosigmoid junction, and anus: Secondary | ICD-10-CM

## 2022-07-27 DIAGNOSIS — K56609 Unspecified intestinal obstruction, unspecified as to partial versus complete obstruction: Secondary | ICD-10-CM | POA: Diagnosis present

## 2022-07-27 DIAGNOSIS — N1330 Unspecified hydronephrosis: Secondary | ICD-10-CM | POA: Diagnosis not present

## 2022-07-27 DIAGNOSIS — Z800 Family history of malignant neoplasm of digestive organs: Secondary | ICD-10-CM

## 2022-07-27 DIAGNOSIS — E876 Hypokalemia: Secondary | ICD-10-CM | POA: Diagnosis present

## 2022-07-27 DIAGNOSIS — Z880 Allergy status to penicillin: Secondary | ICD-10-CM

## 2022-07-27 DIAGNOSIS — Z79890 Hormone replacement therapy (postmenopausal): Secondary | ICD-10-CM

## 2022-07-27 DIAGNOSIS — Z9221 Personal history of antineoplastic chemotherapy: Secondary | ICD-10-CM

## 2022-07-27 DIAGNOSIS — D72829 Elevated white blood cell count, unspecified: Secondary | ICD-10-CM | POA: Diagnosis not present

## 2022-07-27 DIAGNOSIS — Z87891 Personal history of nicotine dependence: Secondary | ICD-10-CM

## 2022-07-27 DIAGNOSIS — M4646 Discitis, unspecified, lumbar region: Secondary | ICD-10-CM | POA: Diagnosis present

## 2022-07-27 DIAGNOSIS — Z981 Arthrodesis status: Secondary | ICD-10-CM | POA: Diagnosis not present

## 2022-07-27 DIAGNOSIS — R1084 Generalized abdominal pain: Secondary | ICD-10-CM | POA: Diagnosis not present

## 2022-07-27 DIAGNOSIS — I10 Essential (primary) hypertension: Secondary | ICD-10-CM | POA: Diagnosis not present

## 2022-07-27 DIAGNOSIS — Z808 Family history of malignant neoplasm of other organs or systems: Secondary | ICD-10-CM

## 2022-07-27 DIAGNOSIS — Z466 Encounter for fitting and adjustment of urinary device: Secondary | ICD-10-CM | POA: Diagnosis not present

## 2022-07-27 DIAGNOSIS — E039 Hypothyroidism, unspecified: Secondary | ICD-10-CM | POA: Diagnosis not present

## 2022-07-27 DIAGNOSIS — Z9049 Acquired absence of other specified parts of digestive tract: Secondary | ICD-10-CM

## 2022-07-27 DIAGNOSIS — E785 Hyperlipidemia, unspecified: Secondary | ICD-10-CM | POA: Diagnosis present

## 2022-07-27 DIAGNOSIS — N134 Hydroureter: Secondary | ICD-10-CM | POA: Diagnosis not present

## 2022-07-27 DIAGNOSIS — Z90710 Acquired absence of both cervix and uterus: Secondary | ICD-10-CM

## 2022-07-27 DIAGNOSIS — F32A Depression, unspecified: Secondary | ICD-10-CM | POA: Diagnosis not present

## 2022-07-27 DIAGNOSIS — K5669 Other partial intestinal obstruction: Secondary | ICD-10-CM | POA: Diagnosis not present

## 2022-07-27 DIAGNOSIS — E7800 Pure hypercholesterolemia, unspecified: Secondary | ICD-10-CM | POA: Diagnosis present

## 2022-07-27 DIAGNOSIS — R109 Unspecified abdominal pain: Secondary | ICD-10-CM | POA: Diagnosis present

## 2022-07-27 DIAGNOSIS — Z803 Family history of malignant neoplasm of breast: Secondary | ICD-10-CM | POA: Diagnosis not present

## 2022-07-27 DIAGNOSIS — Z885 Allergy status to narcotic agent status: Secondary | ICD-10-CM

## 2022-07-27 DIAGNOSIS — Z83438 Family history of other disorder of lipoprotein metabolism and other lipidemia: Secondary | ICD-10-CM

## 2022-07-27 DIAGNOSIS — E860 Dehydration: Secondary | ICD-10-CM | POA: Diagnosis present

## 2022-07-27 DIAGNOSIS — E782 Mixed hyperlipidemia: Secondary | ICD-10-CM | POA: Diagnosis not present

## 2022-07-27 DIAGNOSIS — R112 Nausea with vomiting, unspecified: Secondary | ICD-10-CM | POA: Diagnosis not present

## 2022-07-27 DIAGNOSIS — Z823 Family history of stroke: Secondary | ICD-10-CM

## 2022-07-27 DIAGNOSIS — Z882 Allergy status to sulfonamides status: Secondary | ICD-10-CM

## 2022-07-27 LAB — COMPREHENSIVE METABOLIC PANEL
ALT: 31 U/L (ref 0–44)
AST: 18 U/L (ref 15–41)
Albumin: 4 g/dL (ref 3.5–5.0)
Alkaline Phosphatase: 73 U/L (ref 38–126)
Anion gap: 11 (ref 5–15)
BUN: 10 mg/dL (ref 6–20)
CO2: 24 mmol/L (ref 22–32)
Calcium: 10.7 mg/dL — ABNORMAL HIGH (ref 8.9–10.3)
Chloride: 103 mmol/L (ref 98–111)
Creatinine, Ser: 0.73 mg/dL (ref 0.44–1.00)
GFR, Estimated: >60 mL/min (ref >60)
Glucose, Bld: 151 mg/dL — ABNORMAL HIGH (ref 70–99)
Potassium: 4.3 mmol/L (ref 3.5–5.1)
Sodium: 138 mmol/L (ref 135–145)
Total Bilirubin: 0.4 mg/dL (ref 0.3–1.2)
Total Protein: 8.7 g/dL — ABNORMAL HIGH (ref 6.5–8.1)

## 2022-07-27 LAB — CBC
HCT: 41.3 % (ref 36.0–46.0)
Hemoglobin: 13.1 g/dL (ref 12.0–15.0)
MCH: 25.7 pg — ABNORMAL LOW (ref 26.0–34.0)
MCHC: 31.7 g/dL (ref 30.0–36.0)
MCV: 81 fL (ref 80.0–100.0)
Platelets: 399 10*3/uL (ref 150–400)
RBC: 5.1 MIL/uL (ref 3.87–5.11)
RDW: 16.5 % — ABNORMAL HIGH (ref 11.5–15.5)
WBC: 11 10*3/uL — ABNORMAL HIGH (ref 4.0–10.5)
nRBC: 0 % (ref 0.0–0.2)

## 2022-07-27 LAB — MAGNESIUM: Magnesium: 1.7 mg/dL (ref 1.7–2.4)

## 2022-07-27 LAB — LACTIC ACID, PLASMA: Lactic Acid, Venous: 1.1 mmol/L (ref 0.5–1.9)

## 2022-07-27 LAB — LIPASE, BLOOD: Lipase: <10 U/L — ABNORMAL LOW (ref 11–51)

## 2022-07-27 MED ORDER — HYDROMORPHONE HCL 1 MG/ML IJ SOLN
1.0000 mg | Freq: Once | INTRAMUSCULAR | Status: AC
  Administered 2022-07-27: 1 mg via INTRAVENOUS
  Filled 2022-07-27: qty 1

## 2022-07-27 MED ORDER — ONDANSETRON HCL 4 MG/2ML IJ SOLN
4.0000 mg | Freq: Once | INTRAMUSCULAR | Status: AC
  Administered 2022-07-27: 4 mg via INTRAVENOUS
  Filled 2022-07-27: qty 2

## 2022-07-27 MED ORDER — ONDANSETRON HCL 4 MG/2ML IJ SOLN
4.0000 mg | Freq: Once | INTRAMUSCULAR | Status: AC | PRN
  Administered 2022-07-27: 4 mg via INTRAVENOUS
  Filled 2022-07-27: qty 2

## 2022-07-27 MED ORDER — IOHEXOL 300 MG/ML  SOLN
100.0000 mL | Freq: Once | INTRAMUSCULAR | Status: AC | PRN
  Administered 2022-07-27: 100 mL via INTRAVENOUS

## 2022-07-27 NOTE — ED Triage Notes (Signed)
Patient here POV from Home.  Endorses recent Admission and Discharge 1 Week ago related to SBO. HD IV Hydration and Pain Control during Admission. Felt Better upon Discharged but began to have discomfort on Monday again that has been worsening.   Associated with N/V and more Watery Output from Ostomy.   Uncomfortable during Triage. A&Ox4. GCS 15. Ambulatory.

## 2022-07-27 NOTE — ED Notes (Signed)
Patient transported to CT 

## 2022-07-27 NOTE — ED Notes (Signed)
Pt had emptied colostomy bag into a urinal.  Contents noted to be almost completely liquid.

## 2022-07-27 NOTE — ED Provider Notes (Cosign Needed Addendum)
[facility] EMERGENCY DEPT Provider Note   CSN: [id number] Arrival date & time: 07/27/22  1313     History  Chief Complaint  Patient presents with   Abdominal Pain    Lindsay Little is a 57 y.o. female.  Pt complains of severe abdominal pain.  Pt reports she fells like she has a bowel obstruction again.  Pt recently hospitalized for bowel obstruction.  Bowel obstuction resolved with npo and iv fluids.  Pt is followed by Dr. Johney Maine.  Pt has a history of colorectal cancer.   The history is provided by the patient. No language interpreter was used.  Abdominal Pain Pain location:  Generalized Pain quality: fullness, sharp and stabbing   Pain radiates to:  Does not radiate Pain severity:  Severe Onset quality:  Gradual Timing:  Constant Progression:  Worsening Chronicity:  New Relieved by:  Nothing Worsened by:  Nothing Ineffective treatments:  None tried Associated symptoms: nausea        Home Medications Prior to Admission medications   Medication Sig Start Date End Date Taking? Authorizing Provider  acetaminophen (TYLENOL) 500 MG tablet Take 1,000 mg by mouth every 8 (eight) hours as needed for moderate pain.    [provider]  bisoprolol (ZEBETA) 10 MG tablet Take 10 mg by mouth daily. 11/30/19   [provider]  celecoxib (CELEBREX) 100 MG capsule Take 100 mg by mouth 2 (two) times daily. 12/12/20   [provider]  Cholecalciferol (VITAMIN D3) 5000 units CAPS Take 5,000 Units by mouth at bedtime.     [provider]  Coenzyme Q10 (COQ10) 200 MG CAPS Take 200 mg by mouth at bedtime.    [provider]  DULoxetine (CYMBALTA) 60 MG capsule Take 120 mg by mouth daily.    [provider]  esomeprazole (NEXIUM) 40 MG capsule Take 1 capsule (40 mg total) by mouth 2 (two) times daily before a meal. 12/22/15   Michael Boston, MD  furosemide (LASIX) 20 MG tablet Take 20 mg by mouth daily as needed for edema.      [provider]  HYDROmorphone (DILAUDID) 4 MG tablet Take 1 tablet (4 mg total) by mouth every 6 (six) hours as needed for pain 07/10/22     HYDROmorphone HCl (EXALGO) 8 MG TB24 Take 1 tablet (8 mg total) by mouth daily. MAX IS ONE TABLET A DAY Patient not taking: Reported on 07/17/2022 06/12/22     ibuprofen (ADVIL) 200 MG tablet Take 200 mg by mouth every 6 (six) hours as needed for mild pain.    [provider]  levETIRAcetam (KEPPRA) 250 MG tablet Take 250 mg by mouth 2 (two) times daily.    [provider]  levothyroxine (SYNTHROID) 50 MCG tablet Take 50 mcg by mouth every morning. 02/07/22   [provider]  Melatonin 3 MG TABS Take 3-6 mg by mouth at bedtime as needed (sleep).    [provider]  morphine (MS CONTIN) 15 MG 12 hr tablet Take 15 mg by mouth 2 (two) times daily. Patient not taking: Reported on 07/17/2022 01/29/22   [provider]  Multiple Vitamin (MULTIVITAMIN WITH MINERALS) TABS tablet Take 2 tablets by mouth at bedtime.     [provider]  ondansetron (ZOFRAN) 4 MG tablet Take 1 tablet (4 mg total) by mouth every 6 (six) hours as needed for nausea. 07/20/22   Manuella Ghazi, Pratik D, DO  oxybutynin (DITROPAN-XL) 10 MG 24 hr tablet Take 10 mg by  mouth daily. 02/12/22   [provider]  polyethylene glycol (MIRALAX / GLYCOLAX) 17 g packet Take 17 g by mouth daily.    [provider]  pregabalin (LYRICA) 225 MG capsule Take 225 mg by mouth 2 (two) times daily. 02/07/22   [provider]  promethazine (PHENERGAN) 25 MG tablet TAKE ONE TABLET BY MOUTH EVERY 6 HOURS AS NEEDED FOR NAUSEA OR VOMITING Patient taking differently: Take 25 mg by mouth every 6 (six) hours as needed for nausea or vomiting. 07/04/20   Tommy Medal, Lavell Islam, MD  rosuvastatin (CRESTOR) 5 MG tablet Take 5 mg by mouth at bedtime.    [provider]  triamcinolone lotion (KENALOG) 0.1 % Apply 1 application topically as needed (dry  skin).    [provider]  XTAMPZA ER 9 MG C12A Take 9 mg by mouth 2 (two) times daily.    [provider]      Allergies    Caine-1 [lidocaine], Bupropion, Sulfa antibiotics, Pantoprazole, Adhesive [tape], Doxycycline, Flagyl [metronidazole], Iron, Metoprolol, Oxycodone, and Penicillins    Review of Systems   Review of Systems  Gastrointestinal:  Positive for abdominal pain and nausea.  All other systems reviewed and are negative.   Physical Exam Updated Vital Signs BP (!) 140/62   Pulse (!) 109   Temp 97.7 F (36.5 C) (Oral)   Resp 18   Ht '5\' 11"'$  (1.803 m)   Wt (!) 138.3 kg   SpO2 96%   BMI 42.52 kg/m  Physical Exam Vitals and nursing note reviewed.  Constitutional:      Appearance: She is well-developed.  HENT:     Head: Normocephalic.  Cardiovascular:     Rate and Rhythm: Normal rate and regular rhythm.  Pulmonary:     Effort: Pulmonary effort is normal.  Abdominal:     General: Bowel sounds are normal.     Palpations: Abdomen is soft.     Tenderness: There is generalized abdominal tenderness.  Musculoskeletal:        General: Normal range of motion.     Cervical back: Normal range of motion.  Skin:    General: Skin is warm.  Neurological:     Mental Status: She is alert and oriented to person, place, and time.     ED Results / Procedures / Treatments   Labs (all labs ordered are listed, but only abnormal results are displayed) Labs Reviewed  LIPASE, BLOOD - Abnormal; Notable for the following components:      Result Value   Lipase <10 (*)    All other components within normal limits  COMPREHENSIVE METABOLIC PANEL - Abnormal; Notable for the following components:   Glucose, Bld 151 (*)    Calcium 10.7 (*)    Total Protein 8.7 (*)    All other components within normal limits  CBC - Abnormal; Notable for the following components:   WBC 11.0 (*)    MCH 25.7 (*)    RDW 16.5 (*)    All other components within normal limits   URINALYSIS, ROUTINE W REFLEX MICROSCOPIC    EKG None  Radiology CT ABDOMEN PELVIS W CONTRAST  Result Date: 07/27/2022 CLINICAL DATA:  Right abdominal pain. Concern for small bowel obstruction. Prior cholecystectomy and appendectomy. Recurrent rectal cancer status post chemo radiation therapy and LAR in 2016 with colostomy in 2017. * Tracking Code: BO * EXAM: CT ABDOMEN AND PELVIS WITH CONTRAST TECHNIQUE: Multidetector CT imaging of the abdomen and pelvis was performed using  the standard protocol following bolus administration of intravenous contrast. RADIATION DOSE REDUCTION: This exam was performed according to the departmental dose-optimization program which includes automated exposure control, adjustment of the mA and/or kV according to patient size and/or use of iterative reconstruction technique. CONTRAST:  19m OMNIPAQUE IOHEXOL 300 MG/ML  SOLN COMPARISON:  07/15/2022 CT abdomen/pelvis. FINDINGS: Lower chest: No significant pulmonary nodules or acute consolidative airspace disease. Hepatobiliary: Normal liver size. No liver mass. Normal gallbladder with no radiopaque cholelithiasis. No biliary ductal dilatation. Pancreas: Normal, with no mass or duct dilation. Spleen: Normal size. No mass. Adrenals/Urinary Tract: Normal adrenals. Well-positioned bilateral nephroureteral stents with proximal pigtail portions in the renal pelves and distal pigtail portions in the bladder lumen bilaterally. Mild-to-moderate bilateral hydroureteronephrosis has worsened bilaterally since 07/15/2022 CT. No renal masses. Nondistended unremarkable bladder. Stomach/Bowel: Normal non-distended stomach. Enteroenterostomy suture line noted in the left lower quadrant. Several mildly dilated small bowel loops throughout the abdomen measuring up to 4.8 cm diameter with associated air-fluid levels. No discrete focal small bowel caliber transition. No small bowel wall thickening. Appendix not discretely visualized. Status post end  colostomy in the ventral left abdominal wall with low anterior resection. No colonic wall thickening, diverticulosis or acute pericolonic fat stranding in the remnant large-bowel. No appreciable change in indistinct soft tissue encasing pelvic ureters bilaterally (series 2/image 74). No appreciable change in presacral space mixed fluid and soft tissue attenuation measuring up to 5.4 cm AP thickness and 8.1 cm transverse dimension (series 2/image 79). Vascular/Lymphatic: Atherosclerotic nonaneurysmal abdominal aorta. Patent portal, splenic, hepatic and renal veins. Stable mildly enlarged 1.0 cm left common iliac node (series 2/image 59). Stable mild left para-aortic adenopathy up to 1.2 cm (series 2/image 53). Reproductive: Hysterectomy.  No adnexal masses. Other: No pneumoperitoneum.  No ascites.  No new fluid collections. Musculoskeletal: No aggressive appearing focal osseous lesions. Bilateral posterior spinal fusion hardware at L4-5. Marked lower lumbar degenerative disc disease. IMPRESSION: 1. Well-positioned bilateral nephroureteral stents. Mild-to-moderate bilateral hydroureteronephrosis has worsened bilaterally since 07/15/2022 CT, cannot exclude stent dysfunction. Recommend correlation with serum creatinine. 2. Several mildly dilated small bowel loops throughout the abdomen with associated air-fluid levels. No discrete focal small bowel caliber transition. Findings suggest mechanical distal small bowel obstruction such as due to adhesions. 3. No appreciable change in indistinct soft tissue encasing the pelvic ureters bilaterally, previously described as worrisome for local tumor recurrence. Stable thick presacral space mixed soft tissue and fluid attenuation. 4. Stable mild left para-aortic and left common iliac lymphadenopathy, cannot exclude nodal metastases. 5.  Aortic Atherosclerosis (ICD10-I70.0). Electronically Signed   By: JIlona SorrelM.D.   On: 07/27/2022 17:15    Procedures Procedures     Medications Ordered in ED Medications  ondansetron (ZOFRAN) injection 4 mg (4 mg Intravenous Given 07/27/22 1347)  HYDROmorphone (DILAUDID) injection 1 mg (1 mg Intravenous Given 07/27/22 1633)  ondansetron (ZOFRAN) injection 4 mg (4 mg Intravenous Given 07/27/22 1633)  iohexol (OMNIPAQUE) 300 MG/ML solution 100 mL (100 mLs Intravenous Contrast Given 07/27/22 1648)    ED Course/ Medical Decision Making/ A&P Clinical Course as of 07/27/22 1738  Sat Jul 27, 2022  1734 Stable 89YOF with SBO.  Has ostomy. HX of CRC. Admit for further management [CC]    Clinical Course User Index [CC] CTretha Sciara MD                             Medical Decision Making Pt has a history  of small bowel obstruction.  Pt was hospitalized and discharged one week ago for the same.    Amount and/or Complexity of Data Reviewed External Data Reviewed: notes.    Details: Hospitalist and general surgery notes reviewed.  Labs: ordered.    Details: Labs ordered reviewed and interpreted,  Pt has elvated wbc count of 11 Radiology: ordered and independent interpretation performed. Decision-making details documented in ED Course.    Details: Ct shows probable small bowel obstruction  Discussion of management or test interpretation with external provider(s): I discussed with Dr. Rosendo Gros.  General Surgeon Hospitalist consulted to admit.    Dr. Rogers Blocker will admit to [facility].    Risk Prescription drug management. Decision regarding hospitalization.           Final Clinical Impression(s) / ED Diagnoses Final diagnoses:  SBO (small bowel obstruction) [facility])    Rx / DC Orders ED Discharge Orders     None         Sidney Ace 07/27/22 1911    Sidney Ace 07/27/22 2239    Tretha Sciara, MD 07/28/22 1513

## 2022-07-27 NOTE — Progress Notes (Signed)
Plan of Care Note for accepted transfer   Patient: Lindsay Little MRN: [id number]   [place]: 07/27/2022  Facility requesting transfer: [facility] Requesting Provider: Dr. Pablo Lawrence PA   Reason for transfer: SBO Facility course: 57 year old female with past medical history significant for  HTN, HLD, OM, lumbar discitis, chronic pain syndrome, obesity, GERD, urinary incontinence, hypothyroidism, overactive bladder, IBS, ACD, Chron's disease/rectal cancer with history of resection/colostomy complicated by rectovaginal fistula and intra-abdominal abscesses, multiple episodes of SBO who presented to ED with worsening abdominal pain that started on Monday with associated N/V and watery output from her ostomy.   Discharged on 1/6 for SBO managed conservatively.    Vitals: stable Pertinent labs: wbc: 11.5, calcium: 10.7  CT abdomen/pelvis: 1. Well-positioned bilateral nephroureteral stents. Mild-to-moderate bilateral hydroureteronephrosis has worsened bilaterally since 07/15/2022 CT, cannot exclude stent dysfunction. Recommend correlation with serum creatinine. 2. Several mildly dilated small bowel loops throughout the abdomen with associated air-fluid levels. No discrete focal small bowel caliber transition. Findings suggest mechanical distal small bowel obstruction such as due to adhesions. 3. No appreciable change in indistinct soft tissue encasing the pelvic ureters bilaterally, previously described as worrisome for local tumor recurrence. Stable thick presacral space mixed soft tissue and fluid attenuation. 4. Stable mild left para-aortic and left common iliac lymphadenopathy, cannot exclude nodal metastases.  In ED: general surgery consulted-Dr. Rosendo Gros. Given pain medication and zofran. TRH asked to admit.   Plan of care: The patient is accepted for admission to [place]  unit, at [facility] Asked PA to check magnesium and lactic acid. May need urology curbside as well  for worsening hydro, creatinine wnl.    Author: Orma Flaming, MD 07/27/2022  Check [email or web address] for on-call coverage.  Nursing staff, Please call [place] number on Amion as soon as patient's arrival, so appropriate admitting provider can evaluate the pt.

## 2022-07-27 NOTE — ED Notes (Signed)
ED Provider at bedside. 

## 2022-07-28 ENCOUNTER — Encounter (HOSPITAL_COMMUNITY): Payer: Self-pay | Admitting: Family Medicine

## 2022-07-28 DIAGNOSIS — D72829 Elevated white blood cell count, unspecified: Secondary | ICD-10-CM | POA: Diagnosis not present

## 2022-07-28 DIAGNOSIS — E039 Hypothyroidism, unspecified: Secondary | ICD-10-CM | POA: Diagnosis not present

## 2022-07-28 DIAGNOSIS — E7800 Pure hypercholesterolemia, unspecified: Secondary | ICD-10-CM | POA: Diagnosis present

## 2022-07-28 DIAGNOSIS — F32A Depression, unspecified: Secondary | ICD-10-CM | POA: Diagnosis present

## 2022-07-28 DIAGNOSIS — Z8249 Family history of ischemic heart disease and other diseases of the circulatory system: Secondary | ICD-10-CM | POA: Diagnosis not present

## 2022-07-28 DIAGNOSIS — R112 Nausea with vomiting, unspecified: Secondary | ICD-10-CM | POA: Diagnosis not present

## 2022-07-28 DIAGNOSIS — E782 Mixed hyperlipidemia: Secondary | ICD-10-CM | POA: Diagnosis not present

## 2022-07-28 DIAGNOSIS — Z981 Arthrodesis status: Secondary | ICD-10-CM | POA: Diagnosis not present

## 2022-07-28 DIAGNOSIS — K6389 Other specified diseases of intestine: Secondary | ICD-10-CM | POA: Diagnosis not present

## 2022-07-28 DIAGNOSIS — R1084 Generalized abdominal pain: Secondary | ICD-10-CM | POA: Diagnosis not present

## 2022-07-28 DIAGNOSIS — K5669 Other partial intestinal obstruction: Secondary | ICD-10-CM | POA: Diagnosis not present

## 2022-07-28 DIAGNOSIS — E860 Dehydration: Secondary | ICD-10-CM | POA: Diagnosis present

## 2022-07-28 DIAGNOSIS — I10 Essential (primary) hypertension: Secondary | ICD-10-CM | POA: Diagnosis present

## 2022-07-28 DIAGNOSIS — Z803 Family history of malignant neoplasm of breast: Secondary | ICD-10-CM | POA: Diagnosis not present

## 2022-07-28 DIAGNOSIS — E785 Hyperlipidemia, unspecified: Secondary | ICD-10-CM | POA: Diagnosis present

## 2022-07-28 DIAGNOSIS — Z466 Encounter for fitting and adjustment of urinary device: Secondary | ICD-10-CM | POA: Diagnosis not present

## 2022-07-28 DIAGNOSIS — Z85048 Personal history of other malignant neoplasm of rectum, rectosigmoid junction, and anus: Secondary | ICD-10-CM | POA: Diagnosis not present

## 2022-07-28 DIAGNOSIS — Z87891 Personal history of nicotine dependence: Secondary | ICD-10-CM | POA: Diagnosis not present

## 2022-07-28 DIAGNOSIS — K56609 Unspecified intestinal obstruction, unspecified as to partial versus complete obstruction: Secondary | ICD-10-CM

## 2022-07-28 DIAGNOSIS — K565 Intestinal adhesions [bands], unspecified as to partial versus complete obstruction: Secondary | ICD-10-CM | POA: Diagnosis present

## 2022-07-28 DIAGNOSIS — Z79890 Hormone replacement therapy (postmenopausal): Secondary | ICD-10-CM | POA: Diagnosis not present

## 2022-07-28 DIAGNOSIS — Z923 Personal history of irradiation: Secondary | ICD-10-CM | POA: Diagnosis not present

## 2022-07-28 DIAGNOSIS — Z933 Colostomy status: Secondary | ICD-10-CM | POA: Diagnosis not present

## 2022-07-28 DIAGNOSIS — E876 Hypokalemia: Secondary | ICD-10-CM | POA: Diagnosis present

## 2022-07-28 DIAGNOSIS — Z9049 Acquired absence of other specified parts of digestive tract: Secondary | ICD-10-CM | POA: Diagnosis not present

## 2022-07-28 DIAGNOSIS — Z800 Family history of malignant neoplasm of digestive organs: Secondary | ICD-10-CM | POA: Diagnosis not present

## 2022-07-28 DIAGNOSIS — K219 Gastro-esophageal reflux disease without esophagitis: Secondary | ICD-10-CM | POA: Diagnosis present

## 2022-07-28 DIAGNOSIS — Z6841 Body Mass Index (BMI) 40.0 and over, adult: Secondary | ICD-10-CM | POA: Diagnosis not present

## 2022-07-28 DIAGNOSIS — Z9221 Personal history of antineoplastic chemotherapy: Secondary | ICD-10-CM | POA: Diagnosis not present

## 2022-07-28 DIAGNOSIS — G40909 Epilepsy, unspecified, not intractable, without status epilepticus: Secondary | ICD-10-CM

## 2022-07-28 DIAGNOSIS — Z808 Family history of malignant neoplasm of other organs or systems: Secondary | ICD-10-CM | POA: Diagnosis not present

## 2022-07-28 LAB — MAGNESIUM
Magnesium: 1.9 mg/dL (ref 1.7–2.4)
Magnesium: 1.7 mg/dL (ref 1.7–2.4)

## 2022-07-28 LAB — CBC WITH DIFFERENTIAL/PLATELET
Abs Immature Granulocytes: 0.03 10*3/uL (ref 0.00–0.07)
Basophils Absolute: 0 10*3/uL (ref 0.0–0.1)
Basophils Relative: 0 %
Eosinophils Absolute: 0.2 10*3/uL (ref 0.0–0.5)
Eosinophils Relative: 2 %
HCT: 36.7 % (ref 36.0–46.0)
Hemoglobin: 11.2 g/dL — ABNORMAL LOW (ref 12.0–15.0)
Immature Granulocytes: 0 %
Lymphocytes Relative: 18 %
Lymphs Abs: 1.6 10*3/uL (ref 0.7–4.0)
MCH: 25.6 pg — ABNORMAL LOW (ref 26.0–34.0)
MCHC: 30.5 g/dL (ref 30.0–36.0)
MCV: 83.8 fL (ref 80.0–100.0)
Monocytes Absolute: 0.6 10*3/uL (ref 0.1–1.0)
Monocytes Relative: 6 %
Neutro Abs: 6.4 10*3/uL (ref 1.7–7.7)
Neutrophils Relative %: 74 %
Platelets: 323 10*3/uL (ref 150–400)
RBC: 4.38 MIL/uL (ref 3.87–5.11)
RDW: 16.9 % — ABNORMAL HIGH (ref 11.5–15.5)
WBC: 8.8 10*3/uL (ref 4.0–10.5)
nRBC: 0 % (ref 0.0–0.2)

## 2022-07-28 LAB — URINALYSIS, ROUTINE W REFLEX MICROSCOPIC
Bacteria, UA: NONE SEEN
Bilirubin Urine: NEGATIVE
Glucose, UA: NEGATIVE mg/dL
Ketones, ur: 5 mg/dL — AB
Nitrite: NEGATIVE
Protein, ur: >=300 mg/dL — AB
RBC / HPF: >50 RBC/hpf — ABNORMAL HIGH (ref 0–5)
Specific Gravity, Urine: 1.034 — ABNORMAL HIGH (ref 1.005–1.030)
WBC, UA: >50 WBC/hpf — ABNORMAL HIGH (ref 0–5)
pH: 5 (ref 5.0–8.0)

## 2022-07-28 LAB — COMPREHENSIVE METABOLIC PANEL
ALT: 24 U/L (ref 0–44)
AST: 18 U/L (ref 15–41)
Albumin: 3.1 g/dL — ABNORMAL LOW (ref 3.5–5.0)
Alkaline Phosphatase: 56 U/L (ref 38–126)
Anion gap: 8 (ref 5–15)
BUN: 10 mg/dL (ref 6–20)
CO2: 24 mmol/L (ref 22–32)
Calcium: 9 mg/dL (ref 8.9–10.3)
Chloride: 107 mmol/L (ref 98–111)
Creatinine, Ser: 0.76 mg/dL (ref 0.44–1.00)
GFR, Estimated: >60 mL/min (ref >60)
Glucose, Bld: 115 mg/dL — ABNORMAL HIGH (ref 70–99)
Potassium: 3.4 mmol/L — ABNORMAL LOW (ref 3.5–5.1)
Sodium: 139 mmol/L (ref 135–145)
Total Bilirubin: 0.5 mg/dL (ref 0.3–1.2)
Total Protein: 7.1 g/dL (ref 6.5–8.1)

## 2022-07-28 LAB — PHOSPHORUS: Phosphorus: 2.9 mg/dL (ref 2.5–4.6)

## 2022-07-28 LAB — PROTIME-INR
INR: 1 (ref 0.8–1.2)
Prothrombin Time: 13.4 seconds (ref 11.4–15.2)

## 2022-07-28 MED ORDER — LACTATED RINGERS IV SOLN: INTRAVENOUS | Status: DC

## 2022-07-28 MED ORDER — NALOXONE HCL 0.4 MG/ML IJ SOLN: 0.4000 mg | INTRAMUSCULAR | Status: DC | PRN

## 2022-07-28 MED ORDER — ACETAMINOPHEN 650 MG RE SUPP: 650.0000 mg | Freq: Four times a day (QID) | RECTAL | Status: DC | PRN

## 2022-07-28 MED ORDER — POTASSIUM CHLORIDE 10 MEQ/100ML IV SOLN
10.0000 meq | INTRAVENOUS | Status: AC
  Administered 2022-07-28 – 2022-07-29 (×6): 10 meq via INTRAVENOUS
  Filled 2022-07-28 (×6): qty 100

## 2022-07-28 MED ORDER — ACETAMINOPHEN 325 MG PO TABS
650.0000 mg | ORAL_TABLET | Freq: Four times a day (QID) | ORAL | Status: DC | PRN
  Administered 2022-08-01 – 2022-08-02 (×3): 650 mg via ORAL
  Filled 2022-07-28 (×3): qty 2

## 2022-07-28 MED ORDER — SODIUM CHLORIDE 0.9 % IV SOLN
250.0000 mg | Freq: Two times a day (BID) | INTRAVENOUS | Status: DC
  Administered 2022-07-28 – 2022-08-03 (×13): 250 mg via INTRAVENOUS
  Filled 2022-07-28 (×15): qty 2.5

## 2022-07-28 MED ORDER — POTASSIUM CHLORIDE 10 MEQ/100ML IV SOLN: 10.0000 meq | INTRAVENOUS | Status: DC

## 2022-07-28 MED ORDER — MAGNESIUM SULFATE IN D5W 1-5 GM/100ML-% IV SOLN
1.0000 g | Freq: Once | INTRAVENOUS | Status: AC
  Administered 2022-07-28: 1 g via INTRAVENOUS
  Filled 2022-07-28: qty 100

## 2022-07-28 MED ORDER — ONDANSETRON HCL 4 MG/2ML IJ SOLN
4.0000 mg | Freq: Four times a day (QID) | INTRAMUSCULAR | Status: DC | PRN
  Administered 2022-07-28 – 2022-08-03 (×21): 4 mg via INTRAVENOUS
  Filled 2022-07-28 (×23): qty 2

## 2022-07-28 MED ORDER — HYDROMORPHONE HCL 1 MG/ML IJ SOLN
1.0000 mg | INTRAMUSCULAR | Status: DC | PRN
  Administered 2022-07-28 – 2022-07-30 (×22): 1 mg via INTRAVENOUS
  Filled 2022-07-28 (×23): qty 1

## 2022-07-28 NOTE — Progress Notes (Signed)
PROGRESS NOTE    Lindsay Little  BCW:[id number] DOB: 01/20/1966 DOA: 07/27/2022 PCP: [name], MD  Outpatient Specialists:     Brief Narrative:  Patient is a 57 year old female past medical history significant for hypertension, hyperlipidemia, hypothyroidism, coronary disease, rectal cancer s/p colectomy, complicated by rectovaginal fistula, rectocutaneous fistula, multiple prior intra-abdominal abscesses, small obstruction and seizure.  He was admitted with another episode of small bowel obstruction.  Patient is currently refusing NG tube to low suction.  Patient is reporting nausea.   Assessment & Plan:   Principal Problem:   Small bowel obstruction (HCC) Active Problems:   Essential hypertension   Abdominal pain   Acquired hypothyroidism   Hypercalcemia   Leukocytosis   Hyperlipidemia   Seizure disorder (HCC)   Nausea & vomiting   Small bowel obstruction: -Supportive care. -Patient is not keen on NG tube. -Surgery team is also following patient. -Manage expectantly. -Keep patient hydrated.  Persistent leukocytosis: -Leukocytosis has resolved. -WBC is 8.8 today  Hypercalcemia: -Likely secondary to volume depletion. -Calcium has improved from 10.7 to 9 today.  Hypokalemia: -Monitor and replete. -Check magnesium level as well. -Keep potassium greater than 4.   DVT prophylaxis:  Code Status: Full code Family Communication:  Disposition Plan: Likely, home eventually.   Consultants:  Surgery team is following patient.  Procedures:  None  Antimicrobials:  None   Subjective: No new complaints.  Objective: Vitals:   07/28/22 0147 07/28/22 0438 07/28/22 0958 07/28/22 1323  BP:  (!) 158/81 (!) 123/59 (!) 146/75  Pulse:  78 61 (!) 56  Resp:  '18 16 18  '$ Temp:  97.9 F (36.6 C) 98.1 F (36.7 C) 98 F (36.7 C)  TempSrc:  Oral    SpO2:  95% 98% 98%  Weight: 134.4 kg     Height:        Intake/Output Summary (Last 24 hours) at 07/28/2022  1357 Last data filed at 07/28/2022 0600 Gross per 24 hour  Intake 490.95 ml  Output --  Net 490.95 ml   Filed Weights   07/27/22 1340 07/28/22 0057 07/28/22 0147  Weight: (!) 138.3 kg 134.4 kg 134.4 kg    Examination:  General exam: Appears calm and comfortable.  Patient is obese. Respiratory system: Clear to auscultation.  Cardiovascular system: S1 & S2 heard. Gastrointestinal system: Abdomen is obese, soft and nontender.  Central nervous system: Alert and oriented.  Patient moves all extremities. Extremities: No leg edema.  Data Reviewed: I have personally reviewed following labs and imaging studies  CBC: Recent Labs  Lab 07/27/22 1343 07/28/22 0355  WBC 11.0* 8.8  NEUTROABS  --  6.4  HGB 13.1 11.2*  HCT 41.3 36.7  MCV 81.0 83.8  PLT 399 038   Basic Metabolic Panel: Recent Labs  Lab 07/27/22 1343 07/27/22 2024 07/28/22 0355  NA 138  --  139  K 4.3  --  3.4*  CL 103  --  107  CO2 24  --  24  GLUCOSE 151*  --  115*  BUN 10  --  10  CREATININE 0.73  --  0.76  CALCIUM 10.7*  --  9.0  MG  --  1.7 1.9  PHOS  --   --  2.9   GFR: Estimated Creatinine Clearance: 120.7 mL/min (by C-G formula based on SCr of 0.76 mg/dL). Liver Function Tests: Recent Labs  Lab 07/27/22 1343 07/28/22 0355  AST 18 18  ALT 31 24  ALKPHOS 73 56  BILITOT 0.4 0.5  PROT 8.7* 7.1  ALBUMIN 4.0 3.1*   Recent Labs  Lab 07/27/22 1343  LIPASE <10*   No results for input(s): "AMMONIA" in the last 168 hours. Coagulation Profile: Recent Labs  Lab 07/28/22 0355  INR 1.0   Cardiac Enzymes: No results for input(s): "CKTOTAL", "CKMB", "CKMBINDEX", "TROPONINI" in the last 168 hours. BNP (last 3 results) No results for input(s): "PROBNP" in the last 8760 hours. HbA1C: No results for input(s): "HGBA1C" in the last 72 hours. CBG: No results for input(s): "GLUCAP" in the last 168 hours. Lipid Profile: No results for input(s): "CHOL", "HDL", "LDLCALC", "TRIG", "CHOLHDL", "LDLDIRECT"  in the last 72 hours. Thyroid Function Tests: No results for input(s): "TSH", "T4TOTAL", "FREET4", "T3FREE", "THYROIDAB" in the last 72 hours. Anemia Panel: No results for input(s): "VITAMINB12", "FOLATE", "FERRITIN", "TIBC", "IRON", "RETICCTPCT" in the last 72 hours. Urine analysis:    Component Value Date/Time   COLORURINE AMBER (A) 10/13/2020 1521   APPEARANCEUR CLEAR 10/13/2020 1521   LABSPEC 1.041 (H) 10/13/2020 1521   LABSPEC 1.010 02/26/2016 1604   PHURINE 5.0 10/13/2020 1521   GLUCOSEU NEGATIVE 10/13/2020 1521   GLUCOSEU Negative 02/26/2016 1604   HGBUR LARGE (A) 10/13/2020 1521   BILIRUBINUR NEGATIVE 10/13/2020 1521   BILIRUBINUR Negative 02/26/2016 1604   KETONESUR 5 (A) 10/13/2020 1521   PROTEINUR 30 (A) 10/13/2020 1521   UROBILINOGEN 0.2 02/26/2016 1604   NITRITE NEGATIVE 10/13/2020 1521   LEUKOCYTESUR MODERATE (A) 10/13/2020 1521   LEUKOCYTESUR Trace 02/26/2016 1604   Sepsis Labs: '@LABRCNTIP'$ (procalcitonin:4,lacticidven:4)  )No results found for this or any previous visit (from the past 240 hour(s)).       Radiology Studies: CT ABDOMEN PELVIS W CONTRAST  Result Date: 07/27/2022 CLINICAL DATA:  Right abdominal pain. Concern for small bowel obstruction. Prior cholecystectomy and appendectomy. Recurrent rectal cancer status post chemo radiation therapy and LAR in 2016 with colostomy in 2017. * Tracking Code: BO * EXAM: CT ABDOMEN AND PELVIS WITH CONTRAST TECHNIQUE: Multidetector CT imaging of the abdomen and pelvis was performed using the standard protocol following bolus administration of intravenous contrast. RADIATION DOSE REDUCTION: This exam was performed according to the departmental dose-optimization program which includes automated exposure control, adjustment of the mA and/or kV according to patient size and/or use of iterative reconstruction technique. CONTRAST:  116m OMNIPAQUE IOHEXOL 300 MG/ML  SOLN COMPARISON:  07/15/2022 CT abdomen/pelvis. FINDINGS: Lower  chest: No significant pulmonary nodules or acute consolidative airspace disease. Hepatobiliary: Normal liver size. No liver mass. Normal gallbladder with no radiopaque cholelithiasis. No biliary ductal dilatation. Pancreas: Normal, with no mass or duct dilation. Spleen: Normal size. No mass. Adrenals/Urinary Tract: Normal adrenals. Well-positioned bilateral nephroureteral stents with proximal pigtail portions in the renal pelves and distal pigtail portions in the bladder lumen bilaterally. Mild-to-moderate bilateral hydroureteronephrosis has worsened bilaterally since 07/15/2022 CT. No renal masses. Nondistended unremarkable bladder. Stomach/Bowel: Normal non-distended stomach. Enteroenterostomy suture line noted in the left lower quadrant. Several mildly dilated small bowel loops throughout the abdomen measuring up to 4.8 cm diameter with associated air-fluid levels. No discrete focal small bowel caliber transition. No small bowel wall thickening. Appendix not discretely visualized. Status post end colostomy in the ventral left abdominal wall with low anterior resection. No colonic wall thickening, diverticulosis or acute pericolonic fat stranding in the remnant large-bowel. No appreciable change in indistinct soft tissue encasing pelvic ureters bilaterally (series 2/image 74). No appreciable change in presacral space mixed fluid and soft tissue attenuation measuring up to 5.4 cm AP thickness and 8.1 cm  transverse dimension (series 2/image 79). Vascular/Lymphatic: Atherosclerotic nonaneurysmal abdominal aorta. Patent portal, splenic, hepatic and renal veins. Stable mildly enlarged 1.0 cm left common iliac node (series 2/image 59). Stable mild left para-aortic adenopathy up to 1.2 cm (series 2/image 53). Reproductive: Hysterectomy.  No adnexal masses. Other: No pneumoperitoneum.  No ascites.  No new fluid collections. Musculoskeletal: No aggressive appearing focal osseous lesions. Bilateral posterior spinal fusion  hardware at L4-5. Marked lower lumbar degenerative disc disease. IMPRESSION: 1. Well-positioned bilateral nephroureteral stents. Mild-to-moderate bilateral hydroureteronephrosis has worsened bilaterally since 07/15/2022 CT, cannot exclude stent dysfunction. Recommend correlation with serum creatinine. 2. Several mildly dilated small bowel loops throughout the abdomen with associated air-fluid levels. No discrete focal small bowel caliber transition. Findings suggest mechanical distal small bowel obstruction such as due to adhesions. 3. No appreciable change in indistinct soft tissue encasing the pelvic ureters bilaterally, previously described as worrisome for local tumor recurrence. Stable thick presacral space mixed soft tissue and fluid attenuation. 4. Stable mild left para-aortic and left common iliac lymphadenopathy, cannot exclude nodal metastases. 5.  Aortic Atherosclerosis (ICD10-I70.0). Electronically Signed   By: Ilona Sorrel M.D.   On: 07/27/2022 17:15        Scheduled Meds: Continuous Infusions:  lactated ringers 100 mL/hr at 07/28/22 0936   levETIRAcetam 250 mg (07/28/22 0939)     LOS: 0 days    Time spent: 55 minutes.    Dana Allan, MD  [facility] Pager #: [phone] 7PM-7AM contact night coverage as above

## 2022-07-28 NOTE — H&P (Signed)
History and Physical      Lindsay Little:[id number] DOB: 1965/12/07 DOA: 07/27/2022  PCP: [name], MD  Patient coming from: home   I have personally briefly reviewed patient's old medical records in [place]  Chief Complaint: Abdominal pain  HPI: Lindsay Little is a 57 y.o. female with medical history significant for hypertension, hyperlipidemia, acquired hypothyroidism, Crohn's disease, rectal cancer status post partial colectomy in 2 will diverting colostomy complicated by rectovaginal fistula, rectocutaneous fistula, multiple prior intra-abdominal abscesses, small bowel obstruction, seizure disorder, who is admitted to [facility] on 07/27/2022 by way of transfer from [facility] emergency department with small bowel obstruction after presenting from home to the latter facility complaining of abdominal pain.  Of note, the patient was recently hospitalized in the [facility] health system from 07/15/2022 to 07/20/2022 for small bowel obstruction before being discharged to home on the latter date after small bowel follow-through appears to show interval resolution of necessitating small bowel obstruction.  Approximately 2 days following discharge to home, she noted worsening diffuse sharp abdominal discomfort, starting on Monday, 07/22/2022.  This is progressed in intensity in the interval, with pain now nearly constant.  This has been associated with intermittent nausea resulting in recurrent episodes of nonbloody, nonbilious emesis, with most recent such episode occurring at [facility] emergency department this evening.  This is resulted in a significant reduction in consumption of the food and water over the last few days, and the patient is noted ensuing decrease in output via her ostomy, in the absence of any associated melena or hematochezia.  Denies any associated subjective fever, chills, rigors, generalized myalgias.  No recent dysuria or gross hematuria.  No recent chest  pain, shortness of breath, cough, palpitations, diaphoresis, dizziness, presyncope, or syncope.  Her medical/surgical history are notable for recurrence disease along with rectal cancer status post partial colectomy in 2016 with diverting colostomy, complications have included rectovaginal fistula and intra-abdominal as well as rectocutaneous fistula.  She has also undergone multiple ventral hernia repairs, with surgical history also notable for abdominal hysterectomy in 1996.  She has had multiple prior small bowel obstructions requiring hospitalization.       [facility] ED Course:  Vital signs in the ED were notable for the following: Afebrile; heart rates in the 80s to low 638V; systolic blood pressures in the 140s to 160s; respiratory rate 17-20, oxygen saturation 95 to 100% on room air.  Labs were notable for the following: CMP notable for the following: Sodium 138, bicarbonate 24, creatinine 0.73 compared to most recent prior serum creatinine did 1-0.83 on 07/30/2022, calcium 10.7, albumin 4.0, the liver enzymes within normal limits.  Lipase less than 10.  CBC notable for white cell count 11,000 compared to 6600 on 07/18/2022, hemoglobin 13.1 compared to 10 on 07/20/2022, platelet count 399 compared to 284 on 07/18/2022.  Lactic acid 1.1.  Urinalysis ordered, with result currently pending.  Imaging and additional notable ED work-up: CT abdomen/pelvis with contrast showed several mildly dilated small bowel bowel loops throughout the abdomen and with associated air-fluid levels, without overt focal small bowel transition point observed, but with these findings suggestive of mechanical distal small bowel obstruction in the absence of overt evidence for bowel perforation or abscess.  While in the ED, the following were administered: Dilaudid 1 mg IV x 2 doses, Zofran 4 mg IV x 3 doses.  EDP discussed patient's case and imaging with the on-call general surgeon, Dr. Rosendo Gros, Who recommended admission to  the  hospitalist service [facility] for further evaluation and management of small bowel obstruction, and conveyed that he consult/see the patient in the morning (1/14).   Subsequently, the patient was admitted to [facility] for further evaluation and management small bowel obstruction, with presenting labs also notable for hypercalcemia as well as leukocytosis.    Review of Systems: As per HPI otherwise 10 point review of systems negative.   Past Medical History:  Diagnosis Date   Anxiety    Chemotherapy-induced neuropathy (HCC)    toes and fingers numbness and tingling   Colorectal delayed anastomotic leak s/p resection & colostomy 07/12/2016 11/17/2015   Colostomy in place [facility]) 07/12/2016   due to anastomosis breakdown w/ colovaginal fistula   Colovaginal fistula s/p omentopexy repair 07/12/2016 [id number]/7482   Complication of anesthesia    Depression    Family history of adverse reaction to anesthesia    mother-- ponv   Family history of breast cancer    Family history of pancreatic cancer    Family history of skin cancer    Gastroenteritis 12/20/2015   GERD (gastroesophageal reflux disease)    Hardware complicating wound infection ([place]) 08/03/2019   Hiatal hernia    History of cancer chemotherapy 02-20-2015 to 03-30-2015   History of cardiac murmur as a child    History of chemotherapy    History of chronic gastritis    History of hypertension    no issues since multiple abdminal sx's and chemo--- no medication since 12/ 2016   History of TMJ disorder    Hypercholesteremia    Hypertension    IBS (irritable bowel syndrome)    dx age 58   Intermittent abdominal pain    post-op multiple abdominal sx's    Microcytic anemia    Mild sleep apnea    per pt study 2014  very mild osa , no cpap recommended, recommeded wt loss and sleep routine   OA (osteoarthritis)    left knee /  left shoulder   Obesity    Parastomal hernia s/p lap repair w mesh 07/23/2018 07/23/2018   Pelvic abscess  s/p drainage & omental pedicle flap 11/17/2015 06/23/2015   PONV (postoperative nausea and vomiting)    severe" needs Scopolamine PATCH"    Portacath in place    right chest   Rectal adenocarcinoma ([place]) oncologis-  dr Burr Medico-- after radiation/ chemo (ypT1, N0) --  no recurrence per last note 03/ 2018   dx 01-13-2015-- Stage IIIC (T3, N2, M0) post concurrent radiation and chemotherapy 02-20-2015 to 03-30-2015 /  05-25-2015 s/p  LAR w/ RSO (post-op complicated by late abscess and contained anatomotic leak w/ help percutaneous drainage and antibiotics)    Rectocutaneous fistula 07/10/2016   Rotator cuff tear, left    S/P radiation therapy 02/20/15-03/30/15   colon/rectal   Vitamin D deficiency    Wears glasses    Wears glasses     Past Surgical History:  Procedure Laterality Date   ABDOMINAL HYSTERECTOMY  1996   uterus and cervix   BACK SURGERY  02/12/2018   lumbar surgery   COLON RESECTION N/A 07/12/2016   Procedure: LAPAROSCOPIC LYSIS OF ADHESIONS, OMENTOPEXY, HAND ASSISTED RESECTION OF  COLON, END TO END ANASTOMOSIS, COLOSTOMY;  Surgeon: Michael Boston, MD;  Location: [facility];  Service: General;  Laterality: N/A;   COMBINED HYSTEROSCOPY DIAGNOSTIC / D&C  x2 1990's   DIAGNOSTIC LAPAROSCOPY  age 10 and age 36   EUS N/A 01/18/2015   Procedure: LOWER ENDOSCOPIC ULTRASOUND (EUS);  Surgeon:  Arta Silence, MD;  Location: [facility];  Service: Endoscopy;  Laterality: N/A;   EXCISION OF SKIN TAG  11/17/2015   Procedure: EXCISION OF SKIN TAG;  Surgeon: Michael Boston, MD;  Location: [facility];  Service: General;;   ILEO LOOP COLOSTOMY CLOSURE N/A 11/17/2015   Procedure: LAPAROSCOPIC DIVERTING LOOP ILEOSTOMY  DRAINAGE OF PELVIC ABSCESS;  Surgeon: Michael Boston, MD;  Location: [facility];  Service: General;  Laterality: N/A;   ILEOSTOMY CLOSURE N/A 05/31/2016   Procedure: TAKEDOWN LOOP ILEOSTOMY;  Surgeon: Michael Boston, MD;  Location: [facility];  Service: General;  Laterality: N/A;   IMPACTION REMOVAL  11/17/2015    Procedure: DISIMPACTION REMOVAL;  Surgeon: Michael Boston, MD;  [facility]: [facility];  Service: General;;   INSERTION OF MESH N/A 07/23/2018   Procedure: INSERTION OF MESH X2;  Surgeon: Michael Boston, MD;  [facility]: [facility];  Service: General;  Laterality: N/A;   IR LUMBAR [place] W/IMG GUIDE  07/01/2019   KNEE ARTHROSCOPY Left 1990's   KNEE ARTHROSCOPY W/ MENISCECTOMY Left 09/14/2009   and chondroplasty debridement   LAPAROSCOPIC LYSIS OF ADHESIONS  11/17/2015   Procedure: LAPAROSCOPIC LYSIS OF ADHESIONS;  Surgeon: Michael Boston, MD;  [facility]: [facility];  Service: General;;   LUMBAR WOUND DEBRIDEMENT N/A 04/24/2018   Procedure: wound exploration, irrigation and debridement;  Surgeon: Eustace Moore, MD;  Location: [place];  Service: Neurosurgery;  Laterality: N/A;  wound exploration, irrigation and debridement   LYSIS OF ADHESION N/A 07/23/2018   Procedure: LYSIS OF ADHESIONS;  Surgeon: Michael Boston, MD;  [facility];  Service: General;  Laterality: N/A;   PORT-A-CATH REMOVAL N/A 11/21/2016   Procedure: REMOVAL PORT-A-CATH;  Surgeon: Michael Boston, MD;  Location: [place];  Service: General;  Laterality: N/A;   PORTACATH PLACEMENT N/A 05/25/2015   Procedure: INSERTION PORT-A-CATH;  Surgeon: Michael Boston, MD;  [facility];  Service: General;  Laterality: N/A;-remains inplace Right chest.   ROTATOR CUFF REPAIR Right 2006   TONSILLECTOMY  age 61   [place] N/A 07/23/2018   Procedure: [place];  Surgeon: Michael Boston, MD;  Location[facility];  Service: General;  Laterality: N/A;   XI ROBOTIC ASSISTED LOWER ANTERIOR RESECTION N/A 05/25/2015   Procedure: XI ROBOTIC ASSISTED LOWER ANTERIOR RESECTION, , RIGID PROCTOSCOPY, RIGHT OOPHORECTOMY;  Surgeon: Michael Boston, MD;  [facility];  Service: General;  Laterality: N/A;    Social History:  reports that she quit smoking about 31 years ago. Her smoking use included cigarettes. She has a  1.75 pack-year smoking history. She has never used smokeless tobacco. She reports that she does not drink alcohol and does not use drugs.   Allergies  Allergen Reactions   Caine-1 [Lidocaine] Swelling and Rash    Eyes swell shut; includes all caine drugs except marcaine. EMLA cream OK though (?!)   Bupropion Other (See Comments)    Not effective per Pt.    Sulfa Antibiotics Nausea And Vomiting and Rash   Pantoprazole Other (See Comments)    Not effective per Pt.    Adhesive [Tape] Rash and Other (See Comments)    Blisters - can use paper tape   Doxycycline Nausea Only   Flagyl [Metronidazole] Nausea Only   Iron Nausea Only   Metoprolol Nausea Only and Palpitations   Oxycodone Other (See Comments)    NIGHTMARES. (tolerates hydrocodone or tramadol better)   Penicillins Nausea Only and Rash    Has patient had a PCN  reaction causing immediate rash, facial/tongue/throat swelling, SOB or lightheadedness with hypotension: no Has patient had a PCN reaction causing severe rash involving mucus membranes or skin necrosis: no Has patient had a PCN reaction that required hospitalization no Has patient had a PCN reaction occurring within the last 10 years: no If all of the above answers are "NO", then may proceed with Cephalosporin use.     Family History  Problem Relation Age of Onset   Coronary artery disease Mother 8   Hypertension Mother    Hyperlipidemia Mother    Diabetes Mellitus I Mother    Coronary artery disease Father    Hyperlipidemia Father    Hypertension Father    Cancer Sister        skin - non melanoma   Hyperlipidemia Brother    Cancer Maternal Uncle 50       pancreatic with mets to colon and prostate   Cirrhosis Maternal Uncle    Hypertension Maternal Grandmother    Diabetes Mellitus I Maternal Grandmother    Hyperlipidemia Maternal Grandmother    CVA Maternal Grandmother    Hypertension Maternal Grandfather    Coronary artery disease Maternal Grandfather 30    Hyperlipidemia Maternal Grandfather    Coronary artery disease Paternal Grandmother    Hypertension Paternal Grandmother    Hyperlipidemia Paternal Grandmother    Diabetes Mellitus I Paternal Grandmother    Hypertension Paternal Grandfather    Hyperlipidemia Paternal Grandfather    Coronary artery disease Paternal Grandfather     Family history reviewed and not pertinent    Prior to Admission medications   Medication Sig Start Date End Date Taking? Authorizing Provider  acetaminophen (TYLENOL) 500 MG tablet Take 1,000 mg by mouth every 8 (eight) hours as needed for moderate pain.    [provider]  bisoprolol (ZEBETA) 10 MG tablet Take 10 mg by mouth daily. 11/30/19   [provider]  celecoxib (CELEBREX) 100 MG capsule Take 100 mg by mouth 2 (two) times daily. 12/12/20   [provider]  Cholecalciferol (VITAMIN D3) 5000 units CAPS Take 5,000 Units by mouth at bedtime.     [provider]  Coenzyme Q10 (COQ10) 200 MG CAPS Take 200 mg by mouth at bedtime.    [provider]  DULoxetine (CYMBALTA) 60 MG capsule Take 120 mg by mouth daily.    [provider]  esomeprazole (NEXIUM) 40 MG capsule Take 1 capsule (40 mg total) by mouth 2 (two) times daily before a meal. 12/22/15   Michael Boston, MD  furosemide (LASIX) 20 MG tablet Take 20 mg by mouth daily as needed for edema.     [provider]  HYDROmorphone (DILAUDID) 4 MG tablet Take 1 tablet (4 mg total) by mouth every 6 (six) hours as needed for pain 07/10/22     HYDROmorphone HCl (EXALGO) 8 MG TB24 Take 1 tablet (8 mg total) by mouth daily. MAX IS ONE TABLET A DAY Patient not taking: Reported on 07/17/2022 06/12/22     ibuprofen (ADVIL) 200 MG tablet Take 200 mg by mouth every 6 (six) hours as needed for mild pain.    [provider]  levETIRAcetam (KEPPRA) 250 MG tablet Take 250 mg by mouth 2 (two) times daily.    [provider]  levothyroxine (SYNTHROID) 50  MCG tablet Take 50 mcg by mouth every morning. 02/07/22   [provider]  Melatonin 3 MG TABS Take 3-6 mg by mouth at bedtime as needed (sleep).  [provider]  morphine (MS CONTIN) 15 MG 12 hr tablet Take 15 mg by mouth 2 (two) times daily. Patient not taking: Reported on 07/17/2022 01/29/22   [provider]  Multiple Vitamin (MULTIVITAMIN WITH MINERALS) TABS tablet Take 2 tablets by mouth at bedtime.     [provider]  ondansetron (ZOFRAN) 4 MG tablet Take 1 tablet (4 mg total) by mouth every 6 (six) hours as needed for nausea. 07/20/22   Manuella Ghazi, Pratik D, DO  oxybutynin (DITROPAN-XL) 10 MG 24 hr tablet Take 10 mg by mouth daily. 02/12/22   [provider]  polyethylene glycol (MIRALAX / GLYCOLAX) 17 g packet Take 17 g by mouth daily.    [provider]  pregabalin (LYRICA) 225 MG capsule Take 225 mg by mouth 2 (two) times daily. 02/07/22   [provider]  promethazine (PHENERGAN) 25 MG tablet TAKE ONE TABLET BY MOUTH EVERY 6 HOURS AS NEEDED FOR NAUSEA OR VOMITING Patient taking differently: Take 25 mg by mouth every 6 (six) hours as needed for nausea or vomiting. 07/04/20   Tommy Medal, Lavell Islam, MD  rosuvastatin (CRESTOR) 5 MG tablet Take 5 mg by mouth at bedtime.    [provider]  triamcinolone lotion (KENALOG) 0.1 % Apply 1 application topically as needed (dry skin).    [provider]  XTAMPZA ER 9 MG C12A Take 9 mg by mouth 2 (two) times daily.    [provider]     Objective    Physical Exam: Vitals:   07/27/22 2315 07/27/22 2330 07/27/22 2345 07/28/22 0031  BP: (!) 161/91 (!) 163/99 (!) 153/100 (!) 145/83  Pulse: (!) 109 (!) 102 (!) 103 84  Resp:    18  Temp:    98 F (36.7 C)  TempSrc:    Oral  SpO2: 93% 93% 92% 100%  Weight:      Height:        General: appears to be stated age; alert, oriented Skin: warm, dry, no rash Head:  AT/[place] Mouth:  Oral mucosa membranes appear dry, normal  dentition Neck: supple; trachea midline Heart:  RRR; did not appreciate any M/R/G Lungs: CTAB, did not appreciate any wheezes, rales, or rhonchi Abdomen: Hypoactive bowel sounds; soft, ND, mild generalized tenderness to palpation in the absence of associated guarding, rigidity, or rebound tenderness Vascular: 2+ pedal pulses b/l; 2+ radial pulses b/l Extremities: no peripheral edema, no muscle wasting Neuro: strength and sensation intact in upper and lower extremities b/l    Labs on Admission: I have personally reviewed following labs and imaging studies  CBC: Recent Labs  Lab 07/27/22 1343  WBC 11.0*  HGB 13.1  HCT 41.3  MCV 81.0  PLT 248   Basic Metabolic Panel: Recent Labs  Lab 07/27/22 1343 07/27/22 2024  NA 138  --   K 4.3  --   CL 103  --   CO2 24  --   GLUCOSE 151*  --   BUN 10  --   CREATININE 0.73  --   CALCIUM 10.7*  --   MG  --  1.7   GFR: Estimated Creatinine Clearance: 122.7 mL/min (by C-G formula based on SCr of 0.73 mg/dL). Liver Function Tests: Recent Labs  Lab 07/27/22 1343  AST 18  ALT 31  ALKPHOS 73  BILITOT 0.4  PROT 8.7*  ALBUMIN 4.0   Recent Labs  Lab 07/27/22 1343  LIPASE <10*   No results for input(s): "AMMONIA" in the last  168 hours. Coagulation Profile: No results for input(s): "INR", "PROTIME" in the last 168 hours. Cardiac Enzymes: No results for input(s): "CKTOTAL", "CKMB", "CKMBINDEX", "TROPONINI" in the last 168 hours. BNP (last 3 results) No results for input(s): "PROBNP" in the last 8760 hours. HbA1C: No results for input(s): "HGBA1C" in the last 72 hours. CBG: No results for input(s): "GLUCAP" in the last 168 hours. Lipid Profile: No results for input(s): "CHOL", "HDL", "LDLCALC", "TRIG", "CHOLHDL", "LDLDIRECT" in the last 72 hours. Thyroid Function Tests: No results for input(s): "TSH", "T4TOTAL", "FREET4", "T3FREE", "THYROIDAB" in the last 72 hours. Anemia Panel: No results for input(s): "VITAMINB12",  "FOLATE", "FERRITIN", "TIBC", "IRON", "RETICCTPCT" in the last 72 hours. Urine analysis:    Component Value Date/Time   COLORURINE AMBER (A) 10/13/2020 1521   APPEARANCEUR CLEAR 10/13/2020 1521   LABSPEC 1.041 (H) 10/13/2020 1521   LABSPEC 1.010 02/26/2016 1604   PHURINE 5.0 10/13/2020 1521   GLUCOSEU NEGATIVE 10/13/2020 1521   GLUCOSEU Negative 02/26/2016 1604   HGBUR LARGE (A) 10/13/2020 1521   BILIRUBINUR NEGATIVE 10/13/2020 1521   BILIRUBINUR Negative 02/26/2016 1604   KETONESUR 5 (A) 10/13/2020 1521   PROTEINUR 30 (A) 10/13/2020 1521   UROBILINOGEN 0.2 02/26/2016 1604   NITRITE NEGATIVE 10/13/2020 1521   LEUKOCYTESUR MODERATE (A) 10/13/2020 1521   LEUKOCYTESUR Trace 02/26/2016 1604    Radiological Exams on Admission: CT ABDOMEN PELVIS W CONTRAST  Result Date: 07/27/2022 CLINICAL DATA:  Right abdominal pain. Concern for small bowel obstruction. Prior cholecystectomy and appendectomy. Recurrent rectal cancer status post chemo radiation therapy and LAR in 2016 with colostomy in 2017. * Tracking Code: BO * EXAM: CT ABDOMEN AND PELVIS WITH CONTRAST TECHNIQUE: Multidetector CT imaging of the abdomen and pelvis was performed using the standard protocol following bolus administration of intravenous contrast. RADIATION DOSE REDUCTION: This exam was performed according to the departmental dose-optimization program which includes automated exposure control, adjustment of the mA and/or kV according to patient size and/or use of iterative reconstruction technique. CONTRAST:  137m OMNIPAQUE IOHEXOL 300 MG/ML  SOLN COMPARISON:  07/15/2022 CT abdomen/pelvis. FINDINGS: Lower chest: No significant pulmonary nodules or acute consolidative airspace disease. Hepatobiliary: Normal liver size. No liver mass. Normal gallbladder with no radiopaque cholelithiasis. No biliary ductal dilatation. Pancreas: Normal, with no mass or duct dilation. Spleen: Normal size. No mass. Adrenals/Urinary Tract: Normal adrenals.  Well-positioned bilateral nephroureteral stents with proximal pigtail portions in the renal pelves and distal pigtail portions in the bladder lumen bilaterally. Mild-to-moderate bilateral hydroureteronephrosis has worsened bilaterally since 07/15/2022 CT. No renal masses. Nondistended unremarkable bladder. Stomach/Bowel: Normal non-distended stomach. Enteroenterostomy suture line noted in the left lower quadrant. Several mildly dilated small bowel loops throughout the abdomen measuring up to 4.8 cm diameter with associated air-fluid levels. No discrete focal small bowel caliber transition. No small bowel wall thickening. Appendix not discretely visualized. Status post end colostomy in the ventral left abdominal wall with low anterior resection. No colonic wall thickening, diverticulosis or acute pericolonic fat stranding in the remnant large-bowel. No appreciable change in indistinct soft tissue encasing pelvic ureters bilaterally (series 2/image 74). No appreciable change in presacral space mixed fluid and soft tissue attenuation measuring up to 5.4 cm AP thickness and 8.1 cm transverse dimension (series 2/image 79). Vascular/Lymphatic: Atherosclerotic nonaneurysmal abdominal aorta. Patent portal, splenic, hepatic and renal veins. Stable mildly enlarged 1.0 cm left common iliac node (series 2/image 59). Stable mild left para-aortic adenopathy up to 1.2 cm (series 2/image 53). Reproductive: Hysterectomy.  No adnexal masses. Other: No  pneumoperitoneum.  No ascites.  No new fluid collections. Musculoskeletal: No aggressive appearing focal osseous lesions. Bilateral posterior spinal fusion hardware at L4-5. Marked lower lumbar degenerative disc disease. IMPRESSION: 1. Well-positioned bilateral nephroureteral stents. Mild-to-moderate bilateral hydroureteronephrosis has worsened bilaterally since 07/15/2022 CT, cannot exclude stent dysfunction. Recommend correlation with serum creatinine. 2. Several mildly dilated small  bowel loops throughout the abdomen with associated air-fluid levels. No discrete focal small bowel caliber transition. Findings suggest mechanical distal small bowel obstruction such as due to adhesions. 3. No appreciable change in indistinct soft tissue encasing the pelvic ureters bilaterally, previously described as worrisome for local tumor recurrence. Stable thick presacral space mixed soft tissue and fluid attenuation. 4. Stable mild left para-aortic and left common iliac lymphadenopathy, cannot exclude nodal metastases. 5.  Aortic Atherosclerosis (ICD10-I70.0). Electronically Signed   By: Ilona Sorrel M.D.   On: 07/27/2022 17:15      Assessment/Plan    Principal Problem:   Small bowel obstruction (HCC) Active Problems:   Essential hypertension   Abdominal pain   Acquired hypothyroidism   Hypercalcemia   Leukocytosis   Hyperlipidemia   Seizure disorder (HCC)   Nausea & vomiting      #) Small bowel obstruction: dx on the basis of 6 days of progressive abdominal pain associated with nausea/vomiting and diminished ostomy output, with CT abdomen/pelvis, suggestive of SBO without overt transition point, in the absence of any evidence of perforation or abscess, as further detailed above.  In the context of a history of multiple prior abdominal surgeries, most likely etiology stems from postoperative adhesions. No evidence of peritoneal signs on initial physical exam. Will initiate conservative measures, and observe for return of normal bowel function, as described below.  History of recurrent small bowel obstructions as noted, with recent hospitalization for such, as further outlined above.  EDP at [facility] discussed case/imaging with on-call general surgeon, Dr. Rosendo Gros, who recommended admission to the hospitalist service for further evaluation and management of presenting SBO, including the conservative measures as described below. Dr.  Rosendo Gros to consult, and plans to see  patient in the AM. Of note, patient currently without NGT, but conveys that she would be amenable to placement of such if she subsequently experiences worsening of her symptoms.    Plan: Strict NPO. LR '@100'$  cc/h cc per hour. Prn IV Dilaudid. Prn IV Zofran.  Consider NGT for worsening symptoms, as above.  General surgery to consult, as above. SCD's for DVT prophylaxis in case the patient should ultimately require surgical intervention.  Recheck CMP and CBC in the morning. Check INR.              #) Leukocytosis: Presenting CBC reflects mildly elevated wbc count of 11,000, which is suspected to be inflammatory in nature in the setting of presenting SBO, with an additional element of hemoconcentration in the context of dehydration, with remainder of CBC results also suggestive of corresponding hemoconcentration in the interval since most recent prior CBC was obtained on 07/18/2022, as quantified above.  No clinical evidence to suggest underlying infectious process at this time, including no e/o bowel perforation or abscess . In the absence of suspected underlying infxn, criteria for sepsis not currently met. Consequently, will refrain from initiation of abx at this time.    Plan: Repeat CBC with diff in the AM.  IV fluids, as above.  Further evaluation management of presenting swelling obstruction, as above.  Check urinalysis.             #)  Hypercalcemia: Presenting labs reflect serum calcium level of 10.7.  Suspect element of dehydration, without overt pharmacologic contribution aside from the possible contribution from outpatient vitamin D3 supplementation although this appears less likely relative to dehydration as the driving force. Will initiate gentle IVF's, as above, with repeat calcium level in the morning, with consideration for further expansion of work-up if no ensuing improvement in serum calcium level following interval IVF administration.     Plan: LR, as above.  Monitor  strict I's&O's, daily weights.  CMP in the morning.  Check serum Mg and Phos levels.  Hold home vitamin D3 for now.            #) Hyperlipidemia: documented h/o such. On rosuvastatin as outpatient.   Plan: In the setting of current n.p.o. status, will hold home statin for now.              #) Essential Hypertension: documented h/o such, with outpatient antihypertensive regimen including bisoprolol.  SBP's in the ED today: In the 140s to 160s mmHg.   Plan: Close monitoring of subsequent BP via routine VS. holding home beta-blocker for now in the setting of current n.p.o. status, as above              #) acquired hypothyroidism: documented h/o such, on Synthroid as outpatient.   Plan: Hold home Synthroid for now in the setting of current n.p.o. status.               #) History of seizures: Documented history of such, without clinical evidence to suggest active seizures at this time.  Outpatient antiepileptic regimen consists of: Keppra 250 mg p.o. twice daily.    Plan: In the setting of current n.p.o. status, will convert him oral Keppra to IV Dosing.      DVT prophylaxis: SCD's   Code Status: Full code Family Communication: none Disposition Plan: Per Rounding Team Consults called:  EDP at [facility] discussed patient's case/imaging with the on-call general surgeon, Dr. Rosendo Gros, Who will formally consult, as further detailed above;  Admission status: Inpatient    I SPENT GREATER THAN 75  MINUTES IN CLINICAL CARE TIME/MEDICAL DECISION-MAKING IN COMPLETING THIS ADMISSION.     Jordan DO [facility] [place]   07/28/2022, 12:51 AM

## 2022-07-28 NOTE — Plan of Care (Signed)
  Problem: Education: Goal: Knowledge of General Education information will improve Description: Including pain rating scale, medication(s)/side effects and non-pharmacologic comfort measures Outcome: Progressing

## 2022-07-29 ENCOUNTER — Inpatient Hospital Stay (HOSPITAL_COMMUNITY): Payer: Medicare HMO

## 2022-07-29 DIAGNOSIS — K56609 Unspecified intestinal obstruction, unspecified as to partial versus complete obstruction: Secondary | ICD-10-CM | POA: Diagnosis not present

## 2022-07-29 LAB — RENAL FUNCTION PANEL
Albumin: 3.2 g/dL — ABNORMAL LOW (ref 3.5–5.0)
Anion gap: 7 (ref 5–15)
BUN: 10 mg/dL (ref 6–20)
CO2: 24 mmol/L (ref 22–32)
Calcium: 8.7 mg/dL — ABNORMAL LOW (ref 8.9–10.3)
Chloride: 109 mmol/L (ref 98–111)
Creatinine, Ser: 0.72 mg/dL (ref 0.44–1.00)
GFR, Estimated: >60 mL/min (ref >60)
Glucose, Bld: 93 mg/dL (ref 70–99)
Phosphorus: 3.2 mg/dL (ref 2.5–4.6)
Potassium: 3.2 mmol/L — ABNORMAL LOW (ref 3.5–5.1)
Sodium: 140 mmol/L (ref 135–145)

## 2022-07-29 MED ORDER — POTASSIUM CHLORIDE 10 MEQ/100ML IV SOLN: 10.0000 meq | INTRAVENOUS | Status: DC

## 2022-07-29 MED ORDER — MAGNESIUM SULFATE 2 GM/50ML IV SOLN
2.0000 g | Freq: Once | INTRAVENOUS | Status: AC
  Administered 2022-07-29: 2 g via INTRAVENOUS
  Filled 2022-07-29: qty 50

## 2022-07-29 MED ORDER — POTASSIUM CHLORIDE 10 MEQ/100ML IV SOLN
10.0000 meq | INTRAVENOUS | Status: AC
  Administered 2022-07-29 – 2022-07-30 (×6): 10 meq via INTRAVENOUS
  Filled 2022-07-29 (×6): qty 100

## 2022-07-29 MED ORDER — KCL IN DEXTROSE-NACL 20-5-0.9 MEQ/L-%-% IV SOLN
INTRAVENOUS | Status: DC
  Filled 2022-07-29 (×11): qty 1000

## 2022-07-29 NOTE — TOC CM/SW Note (Signed)
Transition of Care [facility]) Screening Note  Patient Details  Name: Lindsay Little Date of Birth: [birth year]  Transition of Care [facility]) CM/SW Contact:    Sherie Don, LCSW Phone Number: 07/29/2022, 10:18 AM  Transition of Care Department [facility]) has reviewed patient and no TOC needs have been identified at this time. We will continue to monitor patient advancement through interdisciplinary progression rounds. If new patient transition needs arise, please place a TOC consult.

## 2022-07-29 NOTE — Plan of Care (Signed)
  Problem: Activity: ?Goal: Risk for activity intolerance will decrease ?Outcome: Progressing ?  ?Problem: Safety: ?Goal: Ability to remain free from injury will improve ?Outcome: Progressing ?  ?Problem: Pain Managment: ?Goal: General experience of comfort will improve ?Outcome: Progressing ?  ?

## 2022-07-29 NOTE — Progress Notes (Signed)
PROGRESS NOTE    Lindsay Little  NTZ:[id number] DOB: 1966-07-14 DOA: 07/27/2022 PCP: [name], MD  Outpatient Specialists:     Brief Narrative:  Patient is a 57 year old female past medical history significant for hypertension, hyperlipidemia, hypothyroidism, coronary disease, rectal cancer s/p colectomy, complicated by rectovaginal fistula, rectocutaneous fistula, multiple prior intra-abdominal abscesses, small obstruction and seizure.  He was admitted with another episode of small bowel obstruction.  Patient is currently refusing NG tube to low suction.  Patient is reporting nausea.  07/29/2022: Patient seen.  Patient seems to be improving.  Patient tells me that she has broken wind.  Abdominal KUB also shows some improvement..  Surgical input is appreciated.  Will continue to replete abnormal electrolytes.  Potassium is 3.2 today if, will give IV KCl and magnesium.  Will change IV fluids from Ringer's lactate to D5 normal saline with 20 mEq of KCl at 75 cc/h.   Assessment & Plan:   Principal Problem:   Small bowel obstruction (HCC) Active Problems:   Essential hypertension   Abdominal pain   Acquired hypothyroidism   Hypercalcemia   Leukocytosis   Hyperlipidemia   Seizure disorder (HCC)   Nausea & vomiting   Small bowel obstruction: -Supportive care. -Patient is not keen on NG tube. -Surgery team is also following patient. -Manage expectantly. -Keep patient hydrated. 07/29/2022: Improving.  Surgical input is appreciated.  Persistent leukocytosis: -Leukocytosis has resolved.  Hypercalcemia: -Likely secondary to volume depletion. -Calcium has improved from 10.7 to 9 today.  Hypokalemia: -Potassium is 3.2 today. -IV KCl 60 mEq every 6 hours, as well as, IV magnesium sulfate 2 g x 1 dose. -Change IV fluid from Ringer's lactate to D5 normal saline with 20 of Kcl to each liter.   DVT prophylaxis:  Code Status: Full code Family Communication:  Disposition  Plan: Likely, home eventually.   Consultants:  Surgery team is following patient.  Procedures:  None  Antimicrobials:  None   Subjective: No new complaints.  Objective: Vitals:   07/28/22 1733 07/28/22 2117 07/29/22 0026 07/29/22 0519  BP: 127/70 137/69 (!) 148/71 (!) 146/86  Pulse: (!) 57 (!) 51 64 69  Resp: '16 16 16 16  '$ Temp: 98.8 F (37.1 C) 98.2 F (36.8 C) 98.3 F (36.8 C) 97.6 F (36.4 C)  TempSrc:  Oral Oral Oral  SpO2: 94% 100% 96% 98%  Weight:      Height:        Intake/Output Summary (Last 24 hours) at 07/29/2022 1317 Last data filed at 07/29/2022 0550 Gross per 24 hour  Intake 2183.27 ml  Output --  Net 2183.27 ml    Filed Weights   07/27/22 1340 07/28/22 0057 07/28/22 0147  Weight: (!) 138.3 kg 134.4 kg 134.4 kg    Examination:  General exam: Appears calm and comfortable.  Patient is obese. Respiratory system: Clear to auscultation.  Cardiovascular system: S1 & S2 heard. Gastrointestinal system: Abdomen is obese, soft and nontender.  Central nervous system: Alert and oriented.  Patient moves all extremities. Extremities: No leg edema.  Data Reviewed: I have personally reviewed following labs and imaging studies  CBC: Recent Labs  Lab 07/27/22 1343 07/28/22 0355  WBC 11.0* 8.8  NEUTROABS  --  6.4  HGB 13.1 11.2*  HCT 41.3 36.7  MCV 81.0 83.8  PLT 399 675    Basic Metabolic Panel: Recent Labs  Lab 07/27/22 1343 07/27/22 2024 07/28/22 0355 07/28/22 1713 07/29/22 0340  NA 138  --  139  --  140  K 4.3  --  3.4*  --  3.2*  CL 103  --  107  --  109  CO2 24  --  24  --  24  GLUCOSE 151*  --  115*  --  93  BUN 10  --  10  --  10  CREATININE 0.73  --  0.76  --  0.72  CALCIUM 10.7*  --  9.0  --  8.7*  MG  --  1.7 1.9 1.7  --   PHOS  --   --  2.9  --  3.2    GFR: Estimated Creatinine Clearance: 120.7 mL/min (by C-G formula based on SCr of 0.72 mg/dL). Liver Function Tests: Recent Labs  Lab 07/27/22 1343 07/28/22 0355  07/29/22 0340  AST 18 18  --   ALT 31 24  --   ALKPHOS 73 56  --   BILITOT 0.4 0.5  --   PROT 8.7* 7.1  --   ALBUMIN 4.0 3.1* 3.2*    Recent Labs  Lab 07/27/22 1343  LIPASE <10*    No results for input(s): "AMMONIA" in the last 168 hours. Coagulation Profile: Recent Labs  Lab 07/28/22 0355  INR 1.0    Cardiac Enzymes: No results for input(s): "CKTOTAL", "CKMB", "CKMBINDEX", "TROPONINI" in the last 168 hours. BNP (last 3 results) No results for input(s): "PROBNP" in the last 8760 hours. HbA1C: No results for input(s): "HGBA1C" in the last 72 hours. CBG: No results for input(s): "GLUCAP" in the last 168 hours. Lipid Profile: No results for input(s): "CHOL", "HDL", "LDLCALC", "TRIG", "CHOLHDL", "LDLDIRECT" in the last 72 hours. Thyroid Function Tests: No results for input(s): "TSH", "T4TOTAL", "FREET4", "T3FREE", "THYROIDAB" in the last 72 hours. Anemia Panel: No results for input(s): "VITAMINB12", "FOLATE", "FERRITIN", "TIBC", "IRON", "RETICCTPCT" in the last 72 hours. Urine analysis:    Component Value Date/Time   COLORURINE YELLOW 07/28/2022 1448   APPEARANCEUR HAZY (A) 07/28/2022 1448   LABSPEC 1.034 (H) 07/28/2022 1448   LABSPEC 1.010 02/26/2016 1604   PHURINE 5.0 07/28/2022 1448   GLUCOSEU NEGATIVE 07/28/2022 1448   GLUCOSEU Negative 02/26/2016 1604   HGBUR LARGE (A) 07/28/2022 1448   BILIRUBINUR NEGATIVE 07/28/2022 1448   BILIRUBINUR Negative 02/26/2016 1604   KETONESUR 5 (A) 07/28/2022 1448   PROTEINUR >=300 (A) 07/28/2022 1448   UROBILINOGEN 0.2 02/26/2016 1604   NITRITE NEGATIVE 07/28/2022 1448   LEUKOCYTESUR MODERATE (A) 07/28/2022 1448   LEUKOCYTESUR Trace 02/26/2016 1604   Sepsis Labs: '@LABRCNTIP'$ (procalcitonin:4,lacticidven:4)  )No results found for this or any previous visit (from the past 240 hour(s)).       Radiology Studies: DG Abd 1 View  Result Date: 07/29/2022 CLINICAL DATA:  Evaluate for small bowel obstruction. History of end  colostomy within the left upper abdominal quadrant. EXAM: ABDOMEN - 1 VIEW COMPARISON:  CT abdomen and pelvis-07/27/2022 FINDINGS: Redemonstrated very mild gaseous distention of several loops of small bowel with apparent bowel wall thickening with index loop of small bowel within the left mid abdomen measuring 3.5 cm in diameter. Known end colostomy is not well identified. Nondiagnostic evaluation for pneumoperitoneum secondary to supine positioning and exclusion of the lower thorax. No pneumatosis or portal venous gas. Bilateral double-J ureteral stents overlie the expected location of the renal pelvis ease and urinary bladder. Post lower lumbar paraspinal fusion, incompletely evaluated. Mild scoliotic curvature of the thoracolumbar spine with associated multilevel DDD, incompletely evaluated. IMPRESSION: Very mild gaseous distention of several loops of small bowel with apparent  small bowel wall thickening, likely improved compared to the 07/27/2022 abdominal CT. Findings could be normal for this patient with known end colostomy though could be seen with an ileus versus partial small bowel obstruction. Clinical correlation is advised. Electronically Signed   By: Sandi Mariscal M.D.   On: 07/29/2022 11:58   CT ABDOMEN PELVIS W CONTRAST  Result Date: 07/27/2022 CLINICAL DATA:  Right abdominal pain. Concern for small bowel obstruction. Prior cholecystectomy and appendectomy. Recurrent rectal cancer status post chemo radiation therapy and LAR in 2016 with colostomy in 2017. * Tracking Code: BO * EXAM: CT ABDOMEN AND PELVIS WITH CONTRAST TECHNIQUE: Multidetector CT imaging of the abdomen and pelvis was performed using the standard protocol following bolus administration of intravenous contrast. RADIATION DOSE REDUCTION: This exam was performed according to the departmental dose-optimization program which includes automated exposure control, adjustment of the mA and/or kV according to patient size and/or use of  iterative reconstruction technique. CONTRAST:  170m OMNIPAQUE IOHEXOL 300 MG/ML  SOLN COMPARISON:  07/15/2022 CT abdomen/pelvis. FINDINGS: Lower chest: No significant pulmonary nodules or acute consolidative airspace disease. Hepatobiliary: Normal liver size. No liver mass. Normal gallbladder with no radiopaque cholelithiasis. No biliary ductal dilatation. Pancreas: Normal, with no mass or duct dilation. Spleen: Normal size. No mass. Adrenals/Urinary Tract: Normal adrenals. Well-positioned bilateral nephroureteral stents with proximal pigtail portions in the renal pelves and distal pigtail portions in the bladder lumen bilaterally. Mild-to-moderate bilateral hydroureteronephrosis has worsened bilaterally since 07/15/2022 CT. No renal masses. Nondistended unremarkable bladder. Stomach/Bowel: Normal non-distended stomach. Enteroenterostomy suture line noted in the left lower quadrant. Several mildly dilated small bowel loops throughout the abdomen measuring up to 4.8 cm diameter with associated air-fluid levels. No discrete focal small bowel caliber transition. No small bowel wall thickening. Appendix not discretely visualized. Status post end colostomy in the ventral left abdominal wall with low anterior resection. No colonic wall thickening, diverticulosis or acute pericolonic fat stranding in the remnant large-bowel. No appreciable change in indistinct soft tissue encasing pelvic ureters bilaterally (series 2/image 74). No appreciable change in presacral space mixed fluid and soft tissue attenuation measuring up to 5.4 cm AP thickness and 8.1 cm transverse dimension (series 2/image 79). Vascular/Lymphatic: Atherosclerotic nonaneurysmal abdominal aorta. Patent portal, splenic, hepatic and renal veins. Stable mildly enlarged 1.0 cm left common iliac node (series 2/image 59). Stable mild left para-aortic adenopathy up to 1.2 cm (series 2/image 53). Reproductive: Hysterectomy.  No adnexal masses. Other: No  pneumoperitoneum.  No ascites.  No new fluid collections. Musculoskeletal: No aggressive appearing focal osseous lesions. Bilateral posterior spinal fusion hardware at L4-5. Marked lower lumbar degenerative disc disease. IMPRESSION: 1. Well-positioned bilateral nephroureteral stents. Mild-to-moderate bilateral hydroureteronephrosis has worsened bilaterally since 07/15/2022 CT, cannot exclude stent dysfunction. Recommend correlation with serum creatinine. 2. Several mildly dilated small bowel loops throughout the abdomen with associated air-fluid levels. No discrete focal small bowel caliber transition. Findings suggest mechanical distal small bowel obstruction such as due to adhesions. 3. No appreciable change in indistinct soft tissue encasing the pelvic ureters bilaterally, previously described as worrisome for local tumor recurrence. Stable thick presacral space mixed soft tissue and fluid attenuation. 4. Stable mild left para-aortic and left common iliac lymphadenopathy, cannot exclude nodal metastases. 5.  Aortic Atherosclerosis (ICD10-I70.0). Electronically Signed   By: JIlona SorrelM.D.   On: 07/27/2022 17:15        Scheduled Meds: Continuous Infusions:  lactated ringers 100 mL/hr at 07/29/22 0552   levETIRAcetam 250 mg (07/29/22 0812)   potassium  chloride 10 mEq (07/29/22 1222)     LOS: 1 day    Time spent: 35 minutes.    Dana Allan, MD  [facility] Pager #: [phone] 7PM-7AM contact night coverage as above

## 2022-07-30 ENCOUNTER — Inpatient Hospital Stay (HOSPITAL_COMMUNITY): Payer: Medicare HMO

## 2022-07-30 DIAGNOSIS — K56609 Unspecified intestinal obstruction, unspecified as to partial versus complete obstruction: Secondary | ICD-10-CM | POA: Diagnosis not present

## 2022-07-30 DIAGNOSIS — E039 Hypothyroidism, unspecified: Secondary | ICD-10-CM | POA: Diagnosis not present

## 2022-07-30 DIAGNOSIS — R1084 Generalized abdominal pain: Secondary | ICD-10-CM | POA: Diagnosis not present

## 2022-07-30 LAB — MAGNESIUM
Magnesium: 2 mg/dL (ref 1.7–2.4)
Magnesium: 2.1 mg/dL (ref 1.7–2.4)

## 2022-07-30 LAB — RENAL FUNCTION PANEL
Albumin: 3 g/dL — ABNORMAL LOW (ref 3.5–5.0)
Anion gap: 8 (ref 5–15)
BUN: 7 mg/dL (ref 6–20)
CO2: 24 mmol/L (ref 22–32)
Calcium: 8.9 mg/dL (ref 8.9–10.3)
Chloride: 106 mmol/L (ref 98–111)
Creatinine, Ser: 0.74 mg/dL (ref 0.44–1.00)
GFR, Estimated: >60 mL/min (ref >60)
Glucose, Bld: 101 mg/dL — ABNORMAL HIGH (ref 70–99)
Phosphorus: 3.6 mg/dL (ref 2.5–4.6)
Potassium: 3.8 mmol/L (ref 3.5–5.1)
Sodium: 138 mmol/L (ref 135–145)

## 2022-07-30 MED ORDER — LEVOTHYROXINE SODIUM 50 MCG PO TABS
50.0000 ug | ORAL_TABLET | Freq: Every day | ORAL | Status: DC
  Administered 2022-07-31 – 2022-08-03 (×4): 50 ug via ORAL
  Filled 2022-07-30 (×4): qty 1

## 2022-07-30 MED ORDER — POLYETHYLENE GLYCOL 3350 17 G PO PACK
17.0000 g | PACK | Freq: Two times a day (BID) | ORAL | Status: DC
  Administered 2022-07-30 – 2022-08-03 (×9): 17 g via ORAL
  Filled 2022-07-30 (×9): qty 1

## 2022-07-30 MED ORDER — HYDROMORPHONE HCL 1 MG/ML IJ SOLN
1.0000 mg | INTRAMUSCULAR | Status: DC | PRN
  Administered 2022-07-30 – 2022-07-31 (×6): 1 mg via INTRAVENOUS
  Filled 2022-07-30 (×7): qty 1

## 2022-07-30 NOTE — Progress Notes (Signed)
Subjective: Patient well known to our service.  See most recent consult note from Dr. Harlow Asa about a week ago.  She resolved Lindsay Little SBO at that time and went home.  She was doing well until she ate some Ramen with "hard pieces of veggies in it."  After this she started having some abdominal pain, N/V.  She represented to the ED over the weekend and had a CT scan that revealed several mild dilated loops of small bowel with no transition zone.  A possible mechanical SBO was suggested.  She was admitted and has had no further N/V.  Lindsay Little colostomy is putting out minima liquid and some gas.  We have been consulted for further recommendations.  ROS: See above, otherwise other systems negative  Objective: Vital signs in last 24 hours: Temp:  [97.9 F (36.6 C)-98.9 F (37.2 C)] 98.6 F (37 C) (01/16 0540) Pulse Rate:  [56-67] 59 (01/16 0540) Resp:  [17-18] 17 (01/16 0540) BP: (129-141)/(66-78) 141/78 (01/16 0540) SpO2:  [93 %-96 %] 96 % (01/16 0540) Weight:  [141.6 kg] 141.6 kg (01/16 0540) Last BM Date : 07/27/22  Intake/Output from previous day: 01/15 0701 - 01/16 0700 In: 1862.2 [I.V.:1158.6; IV Piggyback:703.6] Out: -  Intake/Output this shift: No intake/output data recorded.  PE: Abd: obese, soft, NT, ND, colostomy viable with some flatus in Lindsay Little bag and minimal liquid output noted.  Lab Results:  Recent Labs    07/27/22 1343 07/28/22 0355  WBC 11.0* 8.8  HGB 13.1 11.2*  HCT 41.3 36.7  PLT 399 323   BMET Recent Labs    07/29/22 0340 07/30/22 0347  NA 140 138  K 3.2* 3.8  CL 109 106  CO2 24 24  GLUCOSE 93 101*  BUN 10 7  CREATININE 0.72 0.74  CALCIUM 8.7* 8.9   PT/INR Recent Labs    07/28/22 0355  LABPROT 13.4  INR 1.0   CMP     Component Value Date/Time   NA 138 07/30/2022 0347   NA 140 05/30/2017 1009   K 3.8 07/30/2022 0347   K 4.3 05/30/2017 1009   CL 106 07/30/2022 0347   CO2 24 07/30/2022 0347   CO2 25 05/30/2017 1009   GLUCOSE 101 (H)  07/30/2022 0347   GLUCOSE 97 05/30/2017 1009   BUN 7 07/30/2022 0347   BUN 17.7 05/30/2017 1009   CREATININE 0.74 07/30/2022 0347   CREATININE 0.71 07/01/2019 1339   CREATININE 0.7 05/30/2017 1009   CALCIUM 8.9 07/30/2022 0347   CALCIUM 9.7 05/30/2017 1009   PROT 7.1 07/28/2022 0355   PROT 7.2 05/30/2017 1009   ALBUMIN 3.0 (L) 07/30/2022 0347   ALBUMIN 3.8 05/30/2017 1009   AST 18 07/28/2022 0355   AST 14 (L) 07/01/2019 1339   AST 16 05/30/2017 1009   ALT 24 07/28/2022 0355   ALT 12 07/01/2019 1339   ALT 22 05/30/2017 1009   ALKPHOS 56 07/28/2022 0355   ALKPHOS 89 05/30/2017 1009   BILITOT 0.5 07/28/2022 0355   BILITOT 0.3 07/01/2019 1339   BILITOT 0.52 05/30/2017 1009   GFRNONAA >60 07/30/2022 0347   GFRNONAA >60 07/01/2019 1339   GFRAA >60 08/25/2019 1326   GFRAA >60 07/01/2019 1339   Lipase     Component Value Date/Time   LIPASE <10 (L) 07/27/2022 1343       Studies/Results: DG Abd 2 Views  Result Date: 07/30/2022 CLINICAL DATA:  Evaluate for SBO. EXAM: ABDOMEN - 2 VIEW COMPARISON:  07/27/2022 FINDINGS: Bilateral nephroureteral stents are identified. Interval decrease in previously noted small bowel dilatation. The right lower quadrant small bowel loops measure up to 2 cm on today's exam compared with 4.5 cm previously. IMPRESSION: 1. Interval decrease in previously noted small bowel dilatation. 2. Bilateral nephroureteral stents remain in place. Electronically Signed   By: Kerby Moors M.D.   On: 07/30/2022 10:00   DG Abd 1 View  Result Date: 07/29/2022 CLINICAL DATA:  Evaluate for small bowel obstruction. History of end colostomy within the left upper abdominal quadrant. EXAM: ABDOMEN - 1 VIEW COMPARISON:  CT abdomen and pelvis-07/27/2022 FINDINGS: Redemonstrated very mild gaseous distention of several loops of small bowel with apparent bowel wall thickening with index loop of small bowel within the left mid abdomen measuring 3.5 cm in diameter. Known end colostomy  is not well identified. Nondiagnostic evaluation for pneumoperitoneum secondary to supine positioning and exclusion of the lower thorax. No pneumatosis or portal venous gas. Bilateral double-J ureteral stents overlie the expected location of the renal pelvis ease and urinary bladder. Post lower lumbar paraspinal fusion, incompletely evaluated. Mild scoliotic curvature of the thoracolumbar spine with associated multilevel DDD, incompletely evaluated. IMPRESSION: Very mild gaseous distention of several loops of small bowel with apparent small bowel wall thickening, likely improved compared to the 07/27/2022 abdominal CT. Findings could be normal for this patient with known end colostomy though could be seen with an ileus versus partial small bowel obstruction. Clinical correlation is advised. Electronically Signed   By: Sandi Mariscal M.D.   On: 07/29/2022 11:58    Anti-infectives: Anti-infectives (From admission, onward)    None        Assessment/Plan Recurrent SBO in setting of complex surgical history -follow up films with no evidence of SBO. -suspect she is improving.  She does not need an NGT at this point unless she worsens -does not want to drink the gastrografin as she states it is terrible -will allow sips of liquids and miralax as she has a fair amount of stool on the right side of Lindsay Little colon on Lindsay Little film.   -patient has had multiple episodes of SBO secondary to adhesive disease from Lindsay Little complex surgical history.  Would still like to avoid surgical intervention if we can get Lindsay Little better conservatively, but at some point, she may require some type of intervention. -will follow   FEN - sips of liquids, miralax VTE - may have chemical prophylaxis from our standpoint ID - none currently needed  HTN HLD Seizure disorder  I reviewed hospitalist notes, last 24 h vitals and pain scores, last 48 h intake and output, last 24 h labs and trends, and last 24 h imaging results.   LOS: 2 days     Henreitta Cea , [facility] 07/30/2022, 10:49 AM Please see Amion for pager number during day hours 7:00am-4:30pm or 7:00am -11:30am on weekends

## 2022-07-30 NOTE — Progress Notes (Signed)
[facility]                                                                              Lindsay Little, is a 57 y.o. female, DOB - [birth year], LEX:[id number] Admit date - 07/27/2022    Outpatient Primary MD for the patient is [name], MD  LOS - 2  days  Chief Complaint  Patient presents with   Abdominal Pain       Brief summary   Patient is a 57 year old female with HTN, HLD, chronic back pain with lumbar discitis, GERD, hypothyroidism, history of Crohn's disease, rectal CA with history of resection/colostomy complicated by rectovaginal fistula, intra-abdominal abscesses, multiple recurrent episodes of SBO presented to ED with worsening abdominal pain that started 5 days prior to admission.  Abdominal pain was associated with nausea, vomiting and watery output from her ostomy. She had just been discharged on 1/6 for SBO that was managed conservatively. In ED, CT abdomen pelvis showed malpositioned bilateral nephroureteral stents, mild to moderate bilateral hydroureteronephrosis, cannot exclude stent dysfunction, recommend correlation with serum creatinine.  Several mildly dilated small bowel loops throughout the abdomen with associated air-fluid levels.  Findings suggest mechanical distal SBO such as due to adhesions.  The   Assessment & Plan    Principal Problem:   Small bowel obstruction ([place]), abdominal pain, nausea and vomiting -In the setting of multiple abdominal surgeries in the past with Crohn's disease, rectal CA, history of resection/colostomy, intra-abdominal abscesses and multiple recurrent SBO. -Currently no active nausea vomiting, does not have much output in colostomy. -General surgery consulted today, will follow recommendations. -Continue n.p.o., gentle hydration, pain control  Active Problems:   Essential hypertension -BP currently stable    Acquired hypothyroidism -Continue Synthroid 50 mcg daily    Hypercalcemia -Mildly elevated on  admission likely due to dehydration, resolved with IVF    Leukocytosis -Resolved    Seizure disorder ([place]) -Stable, continue Keppra IV until able to start p.o.  Chronic back pain -Currently stable, on IV Dilaudid as needed  History of nephroureteral stents as seen on CT abdomen -Concern for mild to moderate bilateral hydroureteronephrosis noted on CT abdomen and pelvis Renal function normal, creatinine 0.7, recommend outpatient follow-up with her urologist   Obesity Estimated body mass index is 43.54 kg/m as calculated from the following:   Height as of this encounter: '5\' 11"'$  (1.803 m).   Weight as of this encounter: 141.6 kg.  Code Status: Full code DVT Prophylaxis:  SCDs Start: 07/28/22 0049   Level of Care: Level of care: Med-Surg Family Communication: Updated patient Disposition Plan:      Remains inpatient appropriate: Home once SBO cleared, tolerating diet and cleared by surgery   Procedures:   Consultants:   General surgery consulted today  Antimicrobials: None   Medications  [START ON 07/31/2022] levothyroxine  50 mcg Oral Q0600   polyethylene glycol  17 g Oral BID      Subjective:   Lindsay Little was seen and examined today.  No active nausea or vomiting.  However colostomy is putting out minimal liquid.  No acute abdominal pain at the time of my encounter.  No fevers or chills.     Objective:   Vitals:   07/29/22 0519 07/29/22 1352 07/29/22 2210 07/30/22 0540  BP: (!) 146/86 138/66 129/66 (!) 141/78  Pulse: 69 67 (!) 56 (!) 59  Resp: '16 18 17 17  '$ Temp: 97.6 F (36.4 C) 97.9 F (36.6 C) 98.9 F (37.2 C) 98.6 F (37 C)  TempSrc: Oral Oral Oral Oral  SpO2: 98% 93% 96% 96%  Weight:    (!) 141.6 kg  Height:        Intake/Output Summary (Last 24 hours) at 07/30/2022 1226 Last data filed at 07/30/2022 0600 Gross per 24 hour  Intake 1862.16 ml  Output --  Net 1862.16 ml     Wt Readings from Last 3 Encounters:  07/30/22 (!) 141.6 kg   03/26/22 (!) 138.3 kg  03/13/22 (!) 140.8 kg     Exam General: Alert and oriented x 3, NAD Cardiovascular: S1 S2 auscultated,  RRR Respiratory: Clear to auscultation bilaterally, no wheezing Gastrointestinal: Soft, NT, ND, colostomy with some flatus but minimal liquid output, hypoactive BS Ext: no pedal edema bilaterally Neuro: Strength 5/5 upper and lower extremities bilaterally Psych: Normal affect     Data Reviewed:  I have personally reviewed following labs    CBC Lab Results  Component Value Date   WBC 8.8 07/28/2022   RBC 4.38 07/28/2022   HGB 11.2 (L) 07/28/2022   HCT 36.7 07/28/2022   MCV 83.8 07/28/2022   MCH 25.6 (L) 07/28/2022   PLT 323 07/28/2022   MCHC 30.5 07/28/2022   RDW 16.9 (H) 07/28/2022   LYMPHSABS 1.6 07/28/2022   MONOABS 0.6 07/28/2022   EOSABS 0.2 07/28/2022   BASOSABS 0.0 62/22/9798     Last metabolic panel Lab Results  Component Value Date   NA 138 07/30/2022   K 3.8 07/30/2022   CL 106 07/30/2022   CO2 24 07/30/2022   BUN 7 07/30/2022   CREATININE 0.74 07/30/2022   GLUCOSE 101 (H) 07/30/2022   GFRNONAA >60 07/30/2022   GFRAA >60 08/25/2019   CALCIUM 8.9 07/30/2022   PHOS 3.6 07/30/2022   PROT 7.1 07/28/2022   ALBUMIN 3.0 (L) 07/30/2022   BILITOT 0.5 07/28/2022   ALKPHOS 56 07/28/2022   AST 18 07/28/2022   ALT 24 07/28/2022   ANIONGAP 8 07/30/2022    CBG (last 3)  No results for input(s): "GLUCAP" in the last 72 hours.    Coagulation Profile: Recent Labs  Lab 07/28/22 0355  INR 1.0     Radiology Studies: I have personally reviewed the imaging studies  DG Abd 2 Views  Result Date: 07/30/2022 CLINICAL DATA:  Evaluate for SBO. EXAM: ABDOMEN - 2 VIEW COMPARISON:  07/27/2022 FINDINGS: Bilateral nephroureteral stents are identified. Interval decrease in previously noted small bowel dilatation. The right lower quadrant small bowel loops measure up to 2 cm on today's exam compared with 4.5 cm previously. IMPRESSION: 1.  Interval decrease in previously noted small bowel dilatation. 2. Bilateral nephroureteral stents remain in place. Electronically Signed   By: Kerby Moors M.D.   On: 07/30/2022 10:00   DG Abd 1 View  Result Date: 07/29/2022 CLINICAL DATA:  Evaluate for small bowel obstruction. History of end colostomy within the left upper abdominal quadrant. EXAM: ABDOMEN - 1 VIEW COMPARISON:  CT abdomen and pelvis-07/27/2022 FINDINGS: Redemonstrated very mild gaseous distention of several loops of small bowel with apparent bowel wall thickening with index loop of small bowel within the left mid abdomen measuring 3.5 cm  in diameter. Known end colostomy is not well identified. Nondiagnostic evaluation for pneumoperitoneum secondary to supine positioning and exclusion of the lower thorax. No pneumatosis or portal venous gas. Bilateral double-J ureteral stents overlie the expected location of the renal pelvis ease and urinary bladder. Post lower lumbar paraspinal fusion, incompletely evaluated. Mild scoliotic curvature of the thoracolumbar spine with associated multilevel DDD, incompletely evaluated. IMPRESSION: Very mild gaseous distention of several loops of small bowel with apparent small bowel wall thickening, likely improved compared to the 07/27/2022 abdominal CT. Findings could be normal for this patient with known end colostomy though could be seen with an ileus versus partial small bowel obstruction. Clinical correlation is advised. Electronically Signed   By: Sandi Mariscal M.D.   On: 07/29/2022 11:58       Psalm Arman M.D. [facility] 07/30/2022, 12:26 PM  Available via Epic secure chat 7am-7pm After 7 pm, please refer to night coverage provider listed on amion.

## 2022-07-31 DIAGNOSIS — K56609 Unspecified intestinal obstruction, unspecified as to partial versus complete obstruction: Secondary | ICD-10-CM | POA: Diagnosis not present

## 2022-07-31 LAB — URINE CULTURE

## 2022-07-31 LAB — CBC
HCT: 36.1 % (ref 36.0–46.0)
Hemoglobin: 11.1 g/dL — ABNORMAL LOW (ref 12.0–15.0)
MCH: 25.5 pg — ABNORMAL LOW (ref 26.0–34.0)
MCHC: 30.7 g/dL (ref 30.0–36.0)
MCV: 83 fL (ref 80.0–100.0)
Platelets: 261 10*3/uL (ref 150–400)
RBC: 4.35 MIL/uL (ref 3.87–5.11)
RDW: 16.6 % — ABNORMAL HIGH (ref 11.5–15.5)
WBC: 7.7 10*3/uL (ref 4.0–10.5)
nRBC: 0 % (ref 0.0–0.2)

## 2022-07-31 LAB — BASIC METABOLIC PANEL
Anion gap: 8 (ref 5–15)
BUN: 6 mg/dL (ref 6–20)
CO2: 26 mmol/L (ref 22–32)
Calcium: 9.1 mg/dL (ref 8.9–10.3)
Chloride: 102 mmol/L (ref 98–111)
Creatinine, Ser: 0.68 mg/dL (ref 0.44–1.00)
GFR, Estimated: >60 mL/min (ref >60)
Glucose, Bld: 99 mg/dL (ref 70–99)
Potassium: 3.4 mmol/L — ABNORMAL LOW (ref 3.5–5.1)
Sodium: 136 mmol/L (ref 135–145)

## 2022-07-31 MED ORDER — POTASSIUM CHLORIDE 10 MEQ/100ML IV SOLN
10.0000 meq | INTRAVENOUS | Status: AC
  Administered 2022-07-31 (×4): 10 meq via INTRAVENOUS
  Filled 2022-07-31 (×4): qty 100

## 2022-07-31 MED ORDER — HYDROMORPHONE HCL 1 MG/ML IJ SOLN
1.0000 mg | INTRAMUSCULAR | Status: DC | PRN
  Administered 2022-07-31 – 2022-08-03 (×22): 1 mg via INTRAVENOUS
  Filled 2022-07-31 (×22): qty 1

## 2022-07-31 NOTE — Progress Notes (Signed)
Subjective: Still nauseas but sipping on clears and now having some output into colostomy though not as much as baseline. Not having significant abdominal pain  ROS: See above, otherwise other systems negative  Objective: Vital signs in last 24 hours: Temp:  [98.3 F (36.8 C)-99 F (37.2 C)] 99 F (37.2 C) (01/17 0645) Pulse Rate:  [53-63] 63 (01/17 0645) Resp:  [16-18] 18 (01/17 0645) BP: (138-154)/(73-76) 154/73 (01/17 0645) SpO2:  [99 %-100 %] 100 % (01/17 0645) Weight:  [137.1 kg] 137.1 kg (01/17 0500) Last BM Date : 07/30/22  Intake/Output from previous day: 01/16 0701 - 01/17 0700 In: 1808.5 [P.O.:270; I.V.:1332.5; IV Piggyback:206] Out: 1600 [Urine:1600] Intake/Output this shift: No intake/output data recorded.  PE: Abd: obese, soft, NT, ND, colostomy with some liquid stool in her bag  Lab Results:  Recent Labs    07/31/22 0351  WBC 7.7  HGB 11.1*  HCT 36.1  PLT 261    BMET Recent Labs    07/30/22 0347 07/31/22 0351  NA 138 136  K 3.8 3.4*  CL 106 102  CO2 24 26  GLUCOSE 101* 99  BUN 7 6  CREATININE 0.74 0.68  CALCIUM 8.9 9.1    PT/INR No results for input(s): "LABPROT", "INR" in the last 72 hours.  CMP     Component Value Date/Time   NA 136 07/31/2022 0351   NA 140 05/30/2017 1009   K 3.4 (L) 07/31/2022 0351   K 4.3 05/30/2017 1009   CL 102 07/31/2022 0351   CO2 26 07/31/2022 0351   CO2 25 05/30/2017 1009   GLUCOSE 99 07/31/2022 0351   GLUCOSE 97 05/30/2017 1009   BUN 6 07/31/2022 0351   BUN 17.7 05/30/2017 1009   CREATININE 0.68 07/31/2022 0351   CREATININE 0.71 07/01/2019 1339   CREATININE 0.7 05/30/2017 1009   CALCIUM 9.1 07/31/2022 0351   CALCIUM 9.7 05/30/2017 1009   PROT 7.1 07/28/2022 0355   PROT 7.2 05/30/2017 1009   ALBUMIN 3.0 (L) 07/30/2022 0347   ALBUMIN 3.8 05/30/2017 1009   AST 18 07/28/2022 0355   AST 14 (L) 07/01/2019 1339   AST 16 05/30/2017 1009   ALT 24 07/28/2022 0355   ALT 12 07/01/2019 1339    ALT 22 05/30/2017 1009   ALKPHOS 56 07/28/2022 0355   ALKPHOS 89 05/30/2017 1009   BILITOT 0.5 07/28/2022 0355   BILITOT 0.3 07/01/2019 1339   BILITOT 0.52 05/30/2017 1009   GFRNONAA >60 07/31/2022 0351   GFRNONAA >60 07/01/2019 1339   GFRAA >60 08/25/2019 1326   GFRAA >60 07/01/2019 1339   Lipase     Component Value Date/Time   LIPASE <10 (L) 07/27/2022 1343       Studies/Results: DG Abd 2 Views  Result Date: 07/30/2022 CLINICAL DATA:  Evaluate for SBO. EXAM: ABDOMEN - 2 VIEW COMPARISON:  07/27/2022 FINDINGS: Bilateral nephroureteral stents are identified. Interval decrease in previously noted small bowel dilatation. The right lower quadrant small bowel loops measure up to 2 cm on today's exam compared with 4.5 cm previously. IMPRESSION: 1. Interval decrease in previously noted small bowel dilatation. 2. Bilateral nephroureteral stents remain in place. Electronically Signed   By: Kerby Moors M.D.   On: 07/30/2022 10:00   DG Abd 1 View  Result Date: 07/29/2022 CLINICAL DATA:  Evaluate for small bowel obstruction. History of end colostomy within the left upper abdominal quadrant. EXAM: ABDOMEN - 1 VIEW COMPARISON:  CT abdomen and pelvis-07/27/2022 FINDINGS: Redemonstrated  very mild gaseous distention of several loops of small bowel with apparent bowel wall thickening with index loop of small bowel within the left mid abdomen measuring 3.5 cm in diameter. Known end colostomy is not well identified. Nondiagnostic evaluation for pneumoperitoneum secondary to supine positioning and exclusion of the lower thorax. No pneumatosis or portal venous gas. Bilateral double-J ureteral stents overlie the expected location of the renal pelvis ease and urinary bladder. Post lower lumbar paraspinal fusion, incompletely evaluated. Mild scoliotic curvature of the thoracolumbar spine with associated multilevel DDD, incompletely evaluated. IMPRESSION: Very mild gaseous distention of several loops of small  bowel with apparent small bowel wall thickening, likely improved compared to the 07/27/2022 abdominal CT. Findings could be normal for this patient with known end colostomy though could be seen with an ileus versus partial small bowel obstruction. Clinical correlation is advised. Electronically Signed   By: Sandi Mariscal M.D.   On: 07/29/2022 11:58    Anti-infectives: Anti-infectives (From admission, onward)    None        Assessment/Plan Recurrent SBO in setting of complex surgical history -follow up films 1/16 with no evidence of SBO. -still with mild symptoms (nausea) but showing improvement with colostomy output. She does not need an NGT at this point unless she worsens -advance to CLD. Continue miralax - mobilize for bowel function  -patient has had multiple episodes of SBO secondary to adhesive disease from her complex surgical history.  Would still like to avoid surgical intervention if we can get her better conservatively, but at some point, she may require some type of intervention. -will follow   FEN - CLD, miralax VTE - may have chemical prophylaxis from our standpoint ID - none currently needed  HTN HLD Seizure disorder  I reviewed hospitalist notes, last 24 h vitals and pain scores, last 48 h intake and output, last 24 h labs and trends, and last 24 h imaging results.   LOS: 3 days    [facility] 07/31/2022, 9:02 AM Please see Amion for pager number during day hours 7:00am-4:30pm or 7:00am -11:30am on weekends

## 2022-07-31 NOTE — Progress Notes (Signed)
[facility]                                                                              Lindsay Little, is a 57 y.o. female, DOB - 10-06-1965, ZHY:[id number] Admit date - 07/27/2022    Outpatient Primary MD for the patient is [name], MD  LOS - 3  days  Chief Complaint  Patient presents with   Abdominal Pain       Brief summary   Patient is a 57 year old female with HTN, HLD, chronic back pain with lumbar discitis, GERD, hypothyroidism, history of Crohn's disease, rectal CA with history of resection/colostomy complicated by rectovaginal fistula, intra-abdominal abscesses, multiple recurrent episodes of SBO presented to ED with worsening abdominal pain that started 5 days prior to admission.  Abdominal pain was associated with nausea, vomiting and watery output from her ostomy. She had just been discharged on 1/6 for SBO that was managed conservatively. In ED, CT abdomen pelvis showed malpositioned bilateral nephroureteral stents, mild to moderate bilateral hydroureteronephrosis, cannot exclude stent dysfunction, recommend correlation with serum creatinine.  Several mildly dilated small bowel loops throughout the abdomen with associated air-fluid levels.  Findings suggest mechanical distal SBO such as due to adhesions.  The   Assessment & Plan    Principal Problem:   Small bowel obstruction ([place]), abdominal pain, nausea and vomiting -In the setting of multiple abdominal surgeries in the past with Crohn's disease, rectal CA, history of resection/colostomy, intra-abdominal abscesses and multiple recurrent SBO. -Currently no active nausea vomiting, does not have much output in colostomy, continue to have pain, but reports feeling better than sunday --Currently on clears, diet [place] per general surgery., gentle hydration, pain control  Active Problems:   Essential hypertension -BP currently stable    Acquired hypothyroidism -Continue Synthroid 50 mcg  daily    Hypercalcemia -Mildly elevated on admission likely due to dehydration, resolved with IVF    Leukocytosis -Resolved    Seizure disorder ([place]) -Stable, continue Keppra IV until able to start p.o.  Chronic back pain -Currently stable, on IV Dilaudid as needed  History of nephroureteral stents as seen on CT abdomen -Concern for mild to moderate bilateral hydroureteronephrosis noted on CT abdomen and pelvis Renal function normal, creatinine 0.7, recommend outpatient follow-up with her urologist   Obesity Estimated body mass index is 42.16 kg/m as calculated from the following:   Height as of this encounter: '5\' 11"'$  (1.803 m).   Weight as of this encounter: 137.1 kg.  Code Status: Full code DVT Prophylaxis:  SCDs Start: 07/28/22 0049   Level of Care: Level of care: Med-Surg Family Communication: Updated patient Disposition Plan:      Remains inpatient appropriate: Home once SBO cleared, tolerating diet and cleared by surgery   Procedures:   Consultants:   General surgery consulted today  Antimicrobials: None   Medications  levothyroxine  50 mcg Oral Q0600   polyethylene glycol  17 g Oral BID      Subjective:   Lindsay Little was seen and examined today.  She is sitting up in chairs,  No active nausea or vomiting.  However colostomy is putting out  minimal liquid.  Reports choric ab pain, wants prn dilaudid change to q2hrs instead of q3hrs   No fevers or chills.     Objective:   Vitals:   07/30/22 2156 07/31/22 0500 07/31/22 0645 07/31/22 1355  BP: 138/73  (!) 154/73 136/85  Pulse: 60  63 97  Resp: '17  18 18  '$ Temp: 98.8 F (37.1 C)  99 F (37.2 C) 98.4 F (36.9 C)  TempSrc: Oral  Oral Oral  SpO2: 100%  100% 95%  Weight:  (!) 137.1 kg    Height:        Intake/Output Summary (Last 24 hours) at 07/31/2022 1933 Last data filed at 07/31/2022 1800 Gross per 24 hour  Intake 3089.72 ml  Output 0 ml  Net 3089.72 ml     Wt Readings from Last 3  Encounters:  07/31/22 (!) 137.1 kg  03/26/22 (!) 138.3 kg  03/13/22 (!) 140.8 kg     Exam General: Alert and oriented x 3, NAD Cardiovascular: S1 S2 auscultated,  RRR Respiratory: Clear to auscultation bilaterally, no wheezing Gastrointestinal: Soft, NT, ND, colostomy with some flatus but minimal liquid output, hypoactive BS Ext: no pedal edema bilaterally Neuro: Strength 5/5 upper and lower extremities bilaterally Psych: Normal affect     Data Reviewed:  I have personally reviewed following labs    CBC Lab Results  Component Value Date   WBC 7.7 07/31/2022   RBC 4.35 07/31/2022   HGB 11.1 (L) 07/31/2022   HCT 36.1 07/31/2022   MCV 83.0 07/31/2022   MCH 25.5 (L) 07/31/2022   PLT 261 07/31/2022   MCHC 30.7 07/31/2022   RDW 16.6 (H) 07/31/2022   LYMPHSABS 1.6 07/28/2022   MONOABS 0.6 07/28/2022   EOSABS 0.2 07/28/2022   BASOSABS 0.0 99/37/1696     Last metabolic panel Lab Results  Component Value Date   NA 136 07/31/2022   K 3.4 (L) 07/31/2022   CL 102 07/31/2022   CO2 26 07/31/2022   BUN 6 07/31/2022   CREATININE 0.68 07/31/2022   GLUCOSE 99 07/31/2022   GFRNONAA >60 07/31/2022   GFRAA >60 08/25/2019   CALCIUM 9.1 07/31/2022   PHOS 3.6 07/30/2022   PROT 7.1 07/28/2022   ALBUMIN 3.0 (L) 07/30/2022   BILITOT 0.5 07/28/2022   ALKPHOS 56 07/28/2022   AST 18 07/28/2022   ALT 24 07/28/2022   ANIONGAP 8 07/31/2022    CBG (last 3)  No results for input(s): "GLUCAP" in the last 72 hours.    Coagulation Profile: Recent Labs  Lab 07/28/22 0355  INR 1.0     Radiology Studies: I have personally reviewed the imaging studies  DG Abd 2 Views  Result Date: 07/30/2022 CLINICAL DATA:  Evaluate for SBO. EXAM: ABDOMEN - 2 VIEW COMPARISON:  07/27/2022 FINDINGS: Bilateral nephroureteral stents are identified. Interval decrease in previously noted small bowel dilatation. The right lower quadrant small bowel loops measure up to 2 cm on today's exam compared with  4.5 cm previously. IMPRESSION: 1. Interval decrease in previously noted small bowel dilatation. 2. Bilateral nephroureteral stents remain in place. Electronically Signed   By: Kerby Moors M.D.   On: 07/30/2022 10:00       Florencia Reasons MD PhD FACP [facility] 07/31/2022, 7:33 PM  Available via Epic secure chat 7am-7pm After 7 pm, please refer to night coverage provider listed on amion.

## 2022-08-01 DIAGNOSIS — K56609 Unspecified intestinal obstruction, unspecified as to partial versus complete obstruction: Secondary | ICD-10-CM | POA: Diagnosis not present

## 2022-08-01 MED ORDER — NYSTATIN 100000 UNIT/GM EX POWD
Freq: Two times a day (BID) | CUTANEOUS | Status: DC
  Filled 2022-08-01 (×2): qty 15

## 2022-08-01 NOTE — Care Management Important Message (Signed)
Important Message  Patient Details IM Letter given. Name: Lindsay Little MRN: [id number] Date of Birth: [birth year]   Medicare Important Message Given:  Yes     Kerin Salen 08/01/2022, 1:06 PM

## 2022-08-01 NOTE — Progress Notes (Signed)
Mobility Specialist - Progress Note   08/01/22 1148  Mobility  Activity Ambulated with assistance in hallway  Level of Assistance Modified independent, requires aide device or extra time  Assistive Device Other (Comment) (IV Pole)  Distance Ambulated (ft) 350 ft  Activity Response Tolerated well  Mobility Referral Yes  $Mobility charge 1 Mobility   Pt received in bed and agreeable to mobility. No complaints during session. Pt to EOB after session with all needs met.   [facility]

## 2022-08-01 NOTE — Progress Notes (Signed)
[facility]                                                                              Lindsay Little, is a 57 y.o. female, DOB - Dec 12, 1965, [id number] Admit date - 07/27/2022    Outpatient Primary MD for the patient is Lindsay Stains, MD  LOS - 4  days  Chief Complaint  Patient presents with   Abdominal Pain       Brief summary   Patient is a 57 year old female with HTN, HLD, chronic back pain with lumbar discitis, GERD, hypothyroidism, history of Crohn's disease, rectal CA with history of resection/colostomy complicated by rectovaginal fistula, intra-abdominal abscesses, multiple recurrent episodes of SBO presented to ED with worsening abdominal pain that started 5 days prior to admission.  Abdominal pain was associated with nausea, vomiting and watery output from her ostomy. She had just been discharged on 1/6 for SBO that was managed conservatively. In ED, CT abdomen pelvis showed malpositioned bilateral nephroureteral stents, mild to moderate bilateral hydroureteronephrosis, cannot exclude stent dysfunction, recommend correlation with serum creatinine.  Several mildly dilated small bowel loops throughout the abdomen with associated air-fluid levels.  Findings suggest mechanical distal SBO such as due to adhesions.  The   Assessment & Plan    Principal Problem:   Small bowel obstruction ([place]), abdominal pain, nausea and vomiting -In the setting of multiple abdominal surgeries in the past with Crohn's disease, rectal CA, history of resection/colostomy, intra-abdominal abscesses and multiple recurrent SBO. -Currently no active nausea vomiting, more output in colostomy, continue to have pain, but reports has chronic pain, reports overall  feeling better  -- diet  per general surgery., gentle hydration, pain control -She is advised to avoid constipation in the future  Active Problems:   Essential hypertension -BP currently stable    Acquired  hypothyroidism -Continue Synthroid 50 mcg daily    Hypercalcemia -Mildly elevated on admission likely due to dehydration, resolved with IVF  Hypokalemia Replaced, recheck in the morning    Leukocytosis -Resolved    Seizure disorder ([place]) -Stable, continue Keppra IV until able to start p.o.  Chronic back pain -Currently stable, on IV Dilaudid as needed  History of nephroureteral stents as seen on CT abdomen -Concern for mild to moderate bilateral hydroureteronephrosis noted on CT abdomen and pelvis Renal function normal, creatinine 0.7, recommend outpatient follow-up with her urologist   Obesity Estimated body mass index is 42.16 kg/m as calculated from the following:   Height as of this encounter: '5\' 11"'$  (1.803 m).   Weight as of this encounter: 137.1 kg.  Code Status: Full code DVT Prophylaxis:  SCDs Start: 07/28/22 0049   Level of Care: Level of care: Med-Surg Family Communication: Updated patient Disposition Plan:       Home once SBO cleared, tolerating diet and cleared by surgery   Procedures:   Consultants:   General surgery   Antimicrobials: None   Medications  levothyroxine  50 mcg Oral Q0600   nystatin   Topical BID   polyethylene glycol  17 g Oral BID      Subjective:   Lindsay Little was seen and examined  today.  She is feeling better, more output in colostomy  No active nausea or vomiting.   Reports choric ab pain,    No fevers or chills.     Objective:   Vitals:   07/31/22 0645 07/31/22 1355 07/31/22 2126 08/01/22 0601  BP: (!) 154/73 136/85 (!) 143/84 (!) 153/83  Pulse: 63 97 89 64  Resp: '18 18 16 18  '$ Temp: 99 F (37.2 C) 98.4 F (36.9 C) 99 F (37.2 C) 98.1 F (36.7 C)  TempSrc: Oral Oral Oral Oral  SpO2: 100% 95% 96% 100%  Weight:      Height:        Intake/Output Summary (Last 24 hours) at 08/01/2022 1230 Last data filed at 08/01/2022 1000 Gross per 24 hour  Intake 2426.5 ml  Output 50 ml  Net 2376.5 ml     Wt  Readings from Last 3 Encounters:  07/31/22 (!) 137.1 kg  03/26/22 (!) 138.3 kg  03/13/22 (!) 140.8 kg     Exam General: Alert and oriented x 3, NAD Cardiovascular: S1 S2 auscultated,  RRR Respiratory: Clear to auscultation bilaterally, no wheezing Gastrointestinal: Soft, NT, ND, colostomy with increased liquid output, hypoactive BS Ext: no pedal edema bilaterally Neuro: Strength 5/5 upper and lower extremities bilaterally Psych: Normal affect     Data Reviewed:  I have personally reviewed following labs    CBC Lab Results  Component Value Date   WBC 7.7 07/31/2022   RBC 4.35 07/31/2022   HGB 11.1 (L) 07/31/2022   HCT 36.1 07/31/2022   MCV 83.0 07/31/2022   MCH 25.5 (L) 07/31/2022   PLT 261 07/31/2022   MCHC 30.7 07/31/2022   RDW 16.6 (H) 07/31/2022   LYMPHSABS 1.6 07/28/2022   MONOABS 0.6 07/28/2022   EOSABS 0.2 07/28/2022   BASOSABS 0.0 73/71/0626     Last metabolic panel Lab Results  Component Value Date   NA 136 07/31/2022   K 3.4 (L) 07/31/2022   CL 102 07/31/2022   CO2 26 07/31/2022   BUN 6 07/31/2022   CREATININE 0.68 07/31/2022   GLUCOSE 99 07/31/2022   GFRNONAA >60 07/31/2022   GFRAA >60 08/25/2019   CALCIUM 9.1 07/31/2022   PHOS 3.6 07/30/2022   PROT 7.1 07/28/2022   ALBUMIN 3.0 (L) 07/30/2022   BILITOT 0.5 07/28/2022   ALKPHOS 56 07/28/2022   AST 18 07/28/2022   ALT 24 07/28/2022   ANIONGAP 8 07/31/2022    CBG (last 3)  No results for input(s): "GLUCAP" in the last 72 hours.    Coagulation Profile: Recent Labs  Lab 07/28/22 0355  INR 1.0     Radiology Studies: I have personally reviewed the imaging studies  No results found.     Lindsay Reasons MD PhD FACP [facility] 08/01/2022, 12:30 PM  Available via Epic secure chat 7am-7pm After 7 pm, please refer to night coverage provider listed on amion.

## 2022-08-01 NOTE — Plan of Care (Signed)
  Problem: Coping: Goal: Level of anxiety will decrease Outcome: Progressing   Problem: Pain Managment: Goal: General experience of comfort will improve Outcome: Progressing

## 2022-08-01 NOTE — Plan of Care (Signed)

## 2022-08-01 NOTE — Progress Notes (Signed)
Subjective: Nausea is stable but tolerating clear liquid diet and having improved colostomy output - has emptied bag overnight. Pain is improved  Objective: Vital signs in last 24 hours: Temp:  [98.1 F (36.7 C)-99 F (37.2 C)] 98.1 F (36.7 C) (01/18 0601) Pulse Rate:  [64-97] 64 (01/18 0601) Resp:  [16-18] 18 (01/18 0601) BP: (136-153)/(83-85) 153/83 (01/18 0601) SpO2:  [95 %-100 %] 100 % (01/18 0601) Last BM Date : 08/01/22  Intake/Output from previous day: 01/17 0701 - 01/18 0700 In: 2345.4 [P.O.:360; I.V.:1479.4; IV Piggyback:506] Out: 50 [Stool:50] Intake/Output this shift: Total I/O In: 81.2 [I.V.:81.2] Out: -   PE: Abd: obese, soft, NT, ND, colostomy with good amount of liquid stool in her bag  Lab Results:  Recent Labs    07/31/22 0351  WBC 7.7  HGB 11.1*  HCT 36.1  PLT 261    BMET Recent Labs    07/30/22 0347 07/31/22 0351  NA 138 136  K 3.8 3.4*  CL 106 102  CO2 24 26  GLUCOSE 101* 99  BUN 7 6  CREATININE 0.74 0.68  CALCIUM 8.9 9.1    PT/INR No results for input(s): "LABPROT", "INR" in the last 72 hours.  CMP     Component Value Date/Time   NA 136 07/31/2022 0351   NA 140 05/30/2017 1009   K 3.4 (L) 07/31/2022 0351   K 4.3 05/30/2017 1009   CL 102 07/31/2022 0351   CO2 26 07/31/2022 0351   CO2 25 05/30/2017 1009   GLUCOSE 99 07/31/2022 0351   GLUCOSE 97 05/30/2017 1009   BUN 6 07/31/2022 0351   BUN 17.7 05/30/2017 1009   CREATININE 0.68 07/31/2022 0351   CREATININE 0.71 07/01/2019 1339   CREATININE 0.7 05/30/2017 1009   CALCIUM 9.1 07/31/2022 0351   CALCIUM 9.7 05/30/2017 1009   PROT 7.1 07/28/2022 0355   PROT 7.2 05/30/2017 1009   ALBUMIN 3.0 (L) 07/30/2022 0347   ALBUMIN 3.8 05/30/2017 1009   AST 18 07/28/2022 0355   AST 14 (L) 07/01/2019 1339   AST 16 05/30/2017 1009   ALT 24 07/28/2022 0355   ALT 12 07/01/2019 1339   ALT 22 05/30/2017 1009   ALKPHOS 56 07/28/2022 0355   ALKPHOS 89 05/30/2017 1009   BILITOT  0.5 07/28/2022 0355   BILITOT 0.3 07/01/2019 1339   BILITOT 0.52 05/30/2017 1009   GFRNONAA >60 07/31/2022 0351   GFRNONAA >60 07/01/2019 1339   GFRAA >60 08/25/2019 1326   GFRAA >60 07/01/2019 1339   Lipase     Component Value Date/Time   LIPASE <10 (L) 07/27/2022 1343       Studies/Results: No results found.  Anti-infectives: Anti-infectives (From admission, onward)    None        Assessment/Plan Recurrent SBO in setting of complex surgical history -follow up films 1/16 with no evidence of SBO. -still with mild symptoms (nausea) but improving and good colostomy output and tolerating CLD. Continue miralax - mobilize for bowel function  -patient has had multiple episodes of SBO secondary to adhesive disease from her complex surgical history.  Would still like to avoid surgical intervention if we can get her better conservatively, but at some point, she may require some type of intervention.  FEN - FLD, miralax VTE - may have chemical prophylaxis from our standpoint ID - none currently needed  HTN HLD Seizure disorder  I reviewed hospitalist notes, last 24 h vitals and pain scores, last 48 h  intake and output, last 24 h labs and trends, and last 24 h imaging results.   LOS: 4 days    [facility] 08/01/2022, 10:13 AM Please see Amion for pager number during day hours 7:00am-4:30pm or 7:00am -11:30am on weekends

## 2022-08-02 DIAGNOSIS — K56609 Unspecified intestinal obstruction, unspecified as to partial versus complete obstruction: Secondary | ICD-10-CM | POA: Diagnosis not present

## 2022-08-02 LAB — BASIC METABOLIC PANEL
Anion gap: 12 (ref 5–15)
BUN: <5 mg/dL — ABNORMAL LOW (ref 6–20)
CO2: 22 mmol/L (ref 22–32)
Calcium: 9.3 mg/dL (ref 8.9–10.3)
Chloride: 104 mmol/L (ref 98–111)
Creatinine, Ser: 0.77 mg/dL (ref 0.44–1.00)
GFR, Estimated: >60 mL/min (ref >60)
Glucose, Bld: 112 mg/dL — ABNORMAL HIGH (ref 70–99)
Potassium: 4 mmol/L (ref 3.5–5.1)
Sodium: 138 mmol/L (ref 135–145)

## 2022-08-02 LAB — PHOSPHORUS: Phosphorus: 4.3 mg/dL (ref 2.5–4.6)

## 2022-08-02 LAB — MAGNESIUM: Magnesium: 1.8 mg/dL (ref 1.7–2.4)

## 2022-08-02 NOTE — Progress Notes (Signed)
Subjective: Tolerating FLD.  Colostomy working well.  Objective: Vital signs in last 24 hours: Temp:  [97.9 F (36.6 C)-98.7 F (37.1 C)] 98.7 F (37.1 C) (01/19 0552) Pulse Rate:  [72-92] 92 (01/19 0552) Resp:  [16-18] 16 (01/19 0552) BP: (129-136)/(64-86) 133/64 (01/19 0552) SpO2:  [96 %-100 %] 96 % (01/19 0552) Weight:  [131.8 kg] 131.8 kg (01/19 0347) Last BM Date : 08/01/22  Intake/Output from previous day: 01/18 0701 - 01/19 0700 In: 1508.6 [P.O.:300; I.V.:1002.6; IV Piggyback:206] Out: 150 [Stool:150] Intake/Output this shift: No intake/output data recorded.  PE: Abd: obese, soft, NT, ND, colostomy with good output per patient.  She just emptied it earlier this morning.  Lab Results:  Recent Labs    07/31/22 0351  WBC 7.7  HGB 11.1*  HCT 36.1  PLT 261   BMET Recent Labs    07/31/22 0351 08/02/22 0336  NA 136 138  K 3.4* 4.0  CL 102 104  CO2 26 22  GLUCOSE 99 112*  BUN 6 <5*  CREATININE 0.68 0.77  CALCIUM 9.1 9.3   PT/INR No results for input(s): "LABPROT", "INR" in the last 72 hours.  CMP     Component Value Date/Time   NA 138 08/02/2022 0336   NA 140 05/30/2017 1009   K 4.0 08/02/2022 0336   K 4.3 05/30/2017 1009   CL 104 08/02/2022 0336   CO2 22 08/02/2022 0336   CO2 25 05/30/2017 1009   GLUCOSE 112 (H) 08/02/2022 0336   GLUCOSE 97 05/30/2017 1009   BUN <5 (L) 08/02/2022 0336   BUN 17.7 05/30/2017 1009   CREATININE 0.77 08/02/2022 0336   CREATININE 0.71 07/01/2019 1339   CREATININE 0.7 05/30/2017 1009   CALCIUM 9.3 08/02/2022 0336   CALCIUM 9.7 05/30/2017 1009   PROT 7.1 07/28/2022 0355   PROT 7.2 05/30/2017 1009   ALBUMIN 3.0 (L) 07/30/2022 0347   ALBUMIN 3.8 05/30/2017 1009   AST 18 07/28/2022 0355   AST 14 (L) 07/01/2019 1339   AST 16 05/30/2017 1009   ALT 24 07/28/2022 0355   ALT 12 07/01/2019 1339   ALT 22 05/30/2017 1009   ALKPHOS 56 07/28/2022 0355   ALKPHOS 89 05/30/2017 1009   BILITOT 0.5 07/28/2022 0355    BILITOT 0.3 07/01/2019 1339   BILITOT 0.52 05/30/2017 1009   GFRNONAA >60 08/02/2022 0336   GFRNONAA >60 07/01/2019 1339   GFRAA >60 08/25/2019 1326   GFRAA >60 07/01/2019 1339   Lipase     Component Value Date/Time   LIPASE <10 (L) 07/27/2022 1343       Studies/Results: No results found.  Anti-infectives: Anti-infectives (From admission, onward)    None        Assessment/Plan Recurrent SBO in setting of complex surgical history -follow up films 1/16 with no evidence of SBO. -Continue miralax - soft diet - mobilize for bowel function  -patient has had multiple episodes of SBO secondary to adhesive disease from her complex surgical history.  Would still like to avoid surgical intervention if we can get her better conservatively, but at some point, she may require some type of intervention. -surgically stable for DC home. Relayed to primary service.  FEN - soft, miralax VTE - may have chemical prophylaxis from our standpoint ID - none currently needed  HTN HLD Seizure disorder  I reviewed hospitalist notes, last 24 h vitals and pain scores, last 48 h intake and output, last 24 h labs and trends, and  last 24 h imaging results.   LOS: 5 days    Henreitta Cea , [facility] 08/02/2022, 8:07 AM Please see Amion for pager number during day hours 7:00am-4:30pm or 7:00am -11:30am on weekends

## 2022-08-02 NOTE — Progress Notes (Signed)
[facility]                                                                              Lindsay Little, is a 57 y.o. female, DOB - 1966/02/14, [id number] Admit date - 07/27/2022    Outpatient Primary MD for the patient is [name], MD  LOS - 5  days  Chief Complaint  Patient presents with   Abdominal Pain       Brief summary   Patient is a 57 year old female with HTN, HLD, chronic back pain with lumbar discitis, GERD, hypothyroidism, history of Crohn's disease, rectal CA with history of resection/colostomy complicated by rectovaginal fistula, intra-abdominal abscesses, multiple recurrent episodes of SBO presented to ED with worsening abdominal pain that started 5 days prior to admission.  Abdominal pain was associated with nausea, vomiting and watery output from her ostomy. She had just been discharged on 1/6 for SBO that was managed conservatively. In ED, CT abdomen pelvis showed malpositioned bilateral nephroureteral stents, mild to moderate bilateral hydroureteronephrosis, cannot exclude stent dysfunction, recommend correlation with serum creatinine.  Several mildly dilated small bowel loops throughout the abdomen with associated air-fluid levels.  Findings suggest mechanical distal SBO such as due to adhesions.  The   Assessment & Plan    Principal Problem:   Small bowel obstruction ([place]), abdominal pain, nausea and vomiting -In the setting of multiple abdominal surgeries in the past with Crohn's disease, rectal CA, history of resection/colostomy, intra-abdominal abscesses and multiple recurrent SBO. -Currently no active nausea vomiting, more output in colostomy, continue to have pain, but reports has chronic pain, reports overall  feeling better  -- diet  per general surgery., gentle hydration, pain control -She is advised to avoid constipation in the future  Active Problems:   Essential hypertension -BP currently stable    Acquired  hypothyroidism -Continue Synthroid 50 mcg daily    Hypercalcemia -Mildly elevated on admission likely due to dehydration, resolved with IVF  Hypokalemia Replaced, recheck in the morning    Leukocytosis -Resolved    Seizure disorder ([place]) -Stable, continue Keppra IV until able to start p.o.  Chronic back pain -Currently stable, on IV Dilaudid as needed  History of nephroureteral stents as seen on CT abdomen -Concern for mild to moderate bilateral hydroureteronephrosis noted on CT abdomen and pelvis Renal function normal, creatinine 0.7, recommend outpatient follow-up with her urologist   Obesity Estimated body mass index is 40.53 kg/m as calculated from the following:   Height as of this encounter: '5\' 11"'$  (1.803 m).   Weight as of this encounter: 131.8 kg.  Code Status: Full code DVT Prophylaxis:  SCDs Start: 07/28/22 0049   Level of Care: Level of care: Med-Surg Family Communication: Updated patient Disposition Plan:       Home once SBO cleared, tolerating diet and cleared by surgery   Procedures:   Consultants:   General surgery   Antimicrobials: None   Medications  levothyroxine  50 mcg Oral Q0600   nystatin   Topical BID   polyethylene glycol  17 g Oral BID      Subjective:    She is seen by gen  surg this am and cleared to discharge home, however, she does not feel she can go home today, she thinks going home tomorrow will be better, as she does not want to go home too early then had to come back again     No fevers or chills.   Labs has all improved  Objective:   Vitals:   08/01/22 2224 08/02/22 0347 08/02/22 0552 08/02/22 1432  BP: 136/84  133/64 131/82  Pulse: 76  92 95  Resp: '18  16 18  '$ Temp: 97.9 F (36.6 C)  98.7 F (37.1 C) 98.4 F (36.9 C)  TempSrc:   Oral   SpO2: 100%  96% 98%  Weight:  131.8 kg    Height:        Intake/Output Summary (Last 24 hours) at 08/02/2022 1635 Last data filed at 08/02/2022 1429 Gross per 24 hour   Intake 1367.46 ml  Output 100 ml  Net 1267.46 ml     Wt Readings from Last 3 Encounters:  08/02/22 131.8 kg  03/26/22 (!) 138.3 kg  03/13/22 (!) 140.8 kg     Exam General: Alert and oriented x 3, NAD Cardiovascular: S1 S2 auscultated,  RRR Respiratory: Clear to auscultation bilaterally, no wheezing Gastrointestinal: Soft, NT, ND, colostomy with increased liquid output, hypoactive BS Ext: no pedal edema bilaterally Neuro: Strength 5/5 upper and lower extremities bilaterally Psych: Normal affect     Data Reviewed:  I have personally reviewed following labs    CBC Lab Results  Component Value Date   WBC 7.7 07/31/2022   RBC 4.35 07/31/2022   HGB 11.1 (L) 07/31/2022   HCT 36.1 07/31/2022   MCV 83.0 07/31/2022   MCH 25.5 (L) 07/31/2022   PLT 261 07/31/2022   MCHC 30.7 07/31/2022   RDW 16.6 (H) 07/31/2022   LYMPHSABS 1.6 07/28/2022   MONOABS 0.6 07/28/2022   EOSABS 0.2 07/28/2022   BASOSABS 0.0 50/09/7046     Last metabolic panel Lab Results  Component Value Date   NA 138 08/02/2022   K 4.0 08/02/2022   CL 104 08/02/2022   CO2 22 08/02/2022   BUN <5 (L) 08/02/2022   CREATININE 0.77 08/02/2022   GLUCOSE 112 (H) 08/02/2022   GFRNONAA >60 08/02/2022   GFRAA >60 08/25/2019   CALCIUM 9.3 08/02/2022   PHOS 4.3 08/02/2022   PROT 7.1 07/28/2022   ALBUMIN 3.0 (L) 07/30/2022   BILITOT 0.5 07/28/2022   ALKPHOS 56 07/28/2022   AST 18 07/28/2022   ALT 24 07/28/2022   ANIONGAP 12 08/02/2022    CBG (last 3)  No results for input(s): "GLUCAP" in the last 72 hours.    Coagulation Profile: Recent Labs  Lab 07/28/22 0355  INR 1.0     Radiology Studies: I have personally reviewed the imaging studies  No results found.     Florencia Reasons MD PhD FACP [facility] 08/02/2022, 4:35 PM  Available via Epic secure chat 7am-7pm After 7 pm, please refer to night coverage provider listed on amion.

## 2022-08-02 NOTE — Plan of Care (Signed)
Problem: Education: Goal: Knowledge of General Education information will improve Description: Including pain rating scale, medication(s)/side effects and non-pharmacologic comfort measures Outcome: Progressing   Problem: Clinical Measurements: Goal: Ability to maintain clinical measurements within normal limits will improve Outcome: Progressing   Problem: Coping: Goal: Level of anxiety will decrease Outcome: [place] Chany Woolworth, RN 08/02/22 8:18 AM

## 2022-08-03 DIAGNOSIS — K56609 Unspecified intestinal obstruction, unspecified as to partial versus complete obstruction: Secondary | ICD-10-CM | POA: Diagnosis not present

## 2022-08-03 NOTE — Discharge Summary (Signed)
Discharge Summary  Lindsay Little TGG:[id number] DOB: 1966/06/06  PCP: [name], MD  Admit date: 07/27/2022 Discharge date: 08/03/2022   Time spent: . 69mns  Recommendations for Outpatient Follow-up:  F/u with PCP within a week  for hospital discharge follow up, repeat cbc/bmp at follow up F/u with digestive health in [place]  F/u with urology in [place] F/u with general surgery Dr GJohney Maineas needed for recurrent obstruction symptoms   Discharge Diagnoses:  Active Hospital Problems   Diagnosis Date Noted   Small bowel obstruction ([facility] 10/12/2020   Hypercalcemia 07/28/2022   Leukocytosis 07/28/2022   Hyperlipidemia 07/28/2022   Seizure disorder ([facility] 07/28/2022   Nausea & vomiting 07/28/2022   Acquired hypothyroidism 02/15/2022   Abdominal pain 12/19/2015   Essential hypertension     Resolved Hospital Problems  No resolved problems to display.    Discharge Condition: stable  Diet recommendation: full liquid to bland diet, advance as tolerated   Filed Weights   07/31/22 0500 08/02/22 0347 08/03/22 0500  Weight: (!) 137.1 kg 131.8 kg 134.5 kg    History of present illness:  Chief Complaint: Abdominal pain   HPI: Lindsay PARLINis a 57y.o. female with medical history significant for hypertension, hyperlipidemia, acquired hypothyroidism, Crohn's disease, rectal cancer status post partial colectomy in 2 will diverting colostomy complicated by rectovaginal fistula, rectocutaneous fistula, multiple prior intra-abdominal abscesses, small bowel obstruction, seizure disorder, [facility]/v  Hospital Course:  Principal Problem:   Small bowel obstruction ([facility] Active Problems:   Essential hypertension   Abdominal pain   Acquired hypothyroidism   Hypercalcemia   Leukocytosis   Hyperlipidemia   Seizure disorder (HCC)   Nausea & vomiting   Assessment and Plan: No notes have been filed under  this hospital service. Service: Hospitalist  Recurrent SBO in setting of complex surgical history  -Crohn's disease, rectal CA, history of resection/colostomy, intra-abdominal abscesses and multiple recurrent SBO.  -Seen by gen surg  -As per gen surg  "Patient does not wish to have a nasogastric tube placed as she was able to avoid that on her previous admission in August. " Patient does not want small bowel protocol with gastrografin as she states it is terrible" "patient has had multiple episodes of SBO secondary to adhesive disease from her complex surgical history. Would still like to avoid surgical intervention if we can get her better conservatively, but at some point, she may require some type of intervention."  -she eventually has improved and able to tolerate diet advancement without vomiting ,reports chronic ab pain -she is cleared to go home by gen surg, she is advised to advance diet as tolerated, and avoid constipation   Hypercalcemia -Mildly elevated on admission likely due to dehydration, resolved with IVF   Hypokalemia Replaced, recheck in the morning     Leukocytosis -Resolved  Essential hypertension -BP currently stable     Acquired hypothyroidism -Continue Synthroid 50 mcg daily  Chronic back pain -Currently stable, on IV Dilaudid as needed   History of nephroureteral stents as seen on CT abdomen -Concern for mild to moderate bilateral hydroureteronephrosis noted on CT abdomen and pelvis Renal function normal, creatinine 0.7, recommend outpatient follow-up with her urologist   Class III obesity  Body mass index is 41.36 kg/m. Life style modification recommended   Discharge Exam: BP (!) 143/81 (BP Location: Right Arm)   Pulse 83   Temp 98.2 F (36.8 C)  Resp 18   Ht '5\' 11"'$  (1.803 m)   Wt 134.5 kg   SpO2 93%   BMI 41.36 kg/m   General: aaox3, NAD Cardiovascular: RRR Respiratory: normal respiratory effort     Discharge Instructions     Diet  - low sodium heart healthy   Complete by: As directed    full liquid to bland diet, advance as tolerated   Increase activity slowly   Complete by: As directed       Allergies as of 08/03/2022       Reactions   Caine-1 [lidocaine] Swelling, Rash   Eyes swell shut; includes all caine drugs except marcaine. EMLA cream OK though (?!)   Bupropion Other (See Comments)   Not effective per Pt.    Sulfa Antibiotics Nausea And Vomiting, Rash   Pantoprazole Other (See Comments)   Not effective per Pt.    Adhesive [tape] Rash, Other (See Comments)   Blisters - can use paper tape   Doxycycline Nausea Only   Flagyl [metronidazole] Nausea Only   Iron Nausea Only   Metoprolol Nausea Only, Palpitations   Oxycodone Other (See Comments)   NIGHTMARES. (tolerates hydrocodone or tramadol better)   Penicillins Nausea Only, Rash   Has patient had a PCN reaction causing immediate rash, facial/tongue/throat swelling, SOB or lightheadedness with hypotension: no Has patient had a PCN reaction causing severe rash involving mucus membranes or skin necrosis: no Has patient had a PCN reaction that required hospitalization no Has patient had a PCN reaction occurring within the last 10 years: no If all of the above answers are "NO", then may proceed with Cephalosporin use.        Medication List     STOP taking these medications    ibuprofen 200 MG tablet Commonly known as: ADVIL   morphine 15 MG 12 hr tablet Commonly known as: MS CONTIN       TAKE these medications    acetaminophen 500 MG tablet Commonly known as: TYLENOL Take 1,000 mg by mouth every 8 (eight) hours as needed for moderate pain.   bisoprolol 10 MG tablet Commonly known as: ZEBETA Take 10 mg by mouth daily.   celecoxib 100 MG capsule Commonly known as: CELEBREX Take 100 mg by mouth 2 (two) times daily.   CoQ10 200 MG Caps Take 200 mg by mouth at bedtime.   DULoxetine 60 MG capsule Commonly known as: CYMBALTA Take  120 mg by mouth daily.   esomeprazole 40 MG capsule Commonly known as: NEXIUM Take 1 capsule (40 mg total) by mouth 2 (two) times daily before a meal.   furosemide 20 MG tablet Commonly known as: LASIX Take 20 mg by mouth daily as needed for edema.   HYDROmorphone 4 MG tablet Commonly known as: DILAUDID Take 1 tablet (4 mg total) by mouth every 6 (six) hours as needed for pain What changed: Another medication with the same name was removed. Continue taking this medication, and follow the directions you see here.   levETIRAcetam 250 MG tablet Commonly known as: KEPPRA Take 250 mg by mouth 2 (two) times daily.   levothyroxine 50 MCG tablet Commonly known as: SYNTHROID Take 50 mcg by mouth every morning.   melatonin 3 MG Tabs tablet Take 3-6 mg by mouth at bedtime as needed (sleep).   multivitamin with minerals Tabs tablet Take 2 tablets by mouth at bedtime.   ondansetron 4 MG tablet Commonly known as: ZOFRAN Take 1 tablet (4 mg total) by mouth every  6 (six) hours as needed for nausea.   oxybutynin 10 MG 24 hr tablet Commonly known as: DITROPAN-XL Take 10 mg by mouth daily.   polyethylene glycol 17 g packet Commonly known as: MIRALAX / GLYCOLAX Take 17 g by mouth daily.   pregabalin 225 MG capsule Commonly known as: LYRICA Take 225 mg by mouth 2 (two) times daily.   promethazine 25 MG tablet Commonly known as: PHENERGAN TAKE ONE TABLET BY MOUTH EVERY 6 HOURS AS NEEDED FOR NAUSEA OR VOMITING What changed: See the new instructions.   rosuvastatin 5 MG tablet Commonly known as: CRESTOR Take 5 mg by mouth at bedtime.   triamcinolone lotion 0.1 % Commonly known as: KENALOG Apply 1 application topically as needed (dry skin).   Vitamin D3 125 MCG (5000 UT) Caps Take 5,000 Units by mouth at bedtime.   Xtampza ER 9 MG C12a Generic drug: oxyCODONE ER Take 9 mg by mouth 2 (two) times daily.       Allergies  Allergen Reactions   Caine-1 [Lidocaine] Swelling and  Rash    Eyes swell shut; includes all caine drugs except marcaine. EMLA cream OK though (?!)   Bupropion Other (See Comments)    Not effective per Pt.    Sulfa Antibiotics Nausea And Vomiting and Rash   Pantoprazole Other (See Comments)    Not effective per Pt.    Adhesive [Tape] Rash and Other (See Comments)    Blisters - can use paper tape   Doxycycline Nausea Only   Flagyl [Metronidazole] Nausea Only   Iron Nausea Only   Metoprolol Nausea Only and Palpitations   Oxycodone Other (See Comments)    NIGHTMARES. (tolerates hydrocodone or tramadol better)   Penicillins Nausea Only and Rash    Has patient had a PCN reaction causing immediate rash, facial/tongue/throat swelling, SOB or lightheadedness with hypotension: no Has patient had a PCN reaction causing severe rash involving mucus membranes or skin necrosis: no Has patient had a PCN reaction that required hospitalization no Has patient had a PCN reaction occurring within the last 10 years: no If all of the above answers are "NO", then may proceed with Cephalosporin use.     Follow-up Information     Michael Boston, MD. Call.   Specialties: General Surgery, Colon and Rectal Surgery Why: As needed for recurrent obstruction symptoms Contact information: [place] [address] [phone]         f/u with urology Follow up.          [name], MD Follow up in 1 week(s).   Specialty: Family Medicine Why: hospital discharge follow up Contact information: [place], [place] [address] [phone]                  The results of significant diagnostics from this hospitalization (including imaging, microbiology, ancillary and laboratory) are listed below for reference.    Significant Diagnostic Studies: DG Abd 2 Views  Result Date: 07/30/2022 CLINICAL DATA:  Evaluate for SBO. EXAM: ABDOMEN - 2 VIEW COMPARISON:  07/27/2022 FINDINGS: Bilateral nephroureteral  stents are identified. Interval decrease in previously noted small bowel dilatation. The right lower quadrant small bowel loops measure up to 2 cm on today's exam compared with 4.5 cm previously. IMPRESSION: 1. Interval decrease in previously noted small bowel dilatation. 2. Bilateral nephroureteral stents remain in place. Electronically Signed   By: Kerby Moors M.D.   On: 07/30/2022 10:00   DG Abd  1 View  Result Date: 07/29/2022 CLINICAL DATA:  Evaluate for small bowel obstruction. History of end colostomy within the left upper abdominal quadrant. EXAM: ABDOMEN - 1 VIEW COMPARISON:  CT abdomen and pelvis-07/27/2022 FINDINGS: Redemonstrated very mild gaseous distention of several loops of small bowel with apparent bowel wall thickening with index loop of small bowel within the left mid abdomen measuring 3.5 cm in diameter. Known end colostomy is not well identified. Nondiagnostic evaluation for pneumoperitoneum secondary to supine positioning and exclusion of the lower thorax. No pneumatosis or portal venous gas. Bilateral double-J ureteral stents overlie the expected location of the renal pelvis ease and urinary bladder. Post lower lumbar paraspinal fusion, incompletely evaluated. Mild scoliotic curvature of the thoracolumbar spine with associated multilevel DDD, incompletely evaluated. IMPRESSION: Very mild gaseous distention of several loops of small bowel with apparent small bowel wall thickening, likely improved compared to the 07/27/2022 abdominal CT. Findings could be normal for this patient with known end colostomy though could be seen with an ileus versus partial small bowel obstruction. Clinical correlation is advised. Electronically Signed   By: Sandi Mariscal M.D.   On: 07/29/2022 11:58   CT ABDOMEN PELVIS W CONTRAST  Result Date: 07/27/2022 CLINICAL DATA:  Right abdominal pain. Concern for small bowel obstruction. Prior cholecystectomy and appendectomy. Recurrent rectal cancer status post chemo  radiation therapy and LAR in 2016 with colostomy in 2017. * Tracking Code: BO * EXAM: CT ABDOMEN AND PELVIS WITH CONTRAST TECHNIQUE: Multidetector CT imaging of the abdomen and pelvis was performed using the standard protocol following bolus administration of intravenous contrast. RADIATION DOSE REDUCTION: This exam was performed according to the departmental dose-optimization program which includes automated exposure control, adjustment of the mA and/or kV according to patient size and/or use of iterative reconstruction technique. CONTRAST:  158m OMNIPAQUE IOHEXOL 300 MG/ML  SOLN COMPARISON:  07/15/2022 CT abdomen/pelvis. FINDINGS: Lower chest: No significant pulmonary nodules or acute consolidative airspace disease. Hepatobiliary: Normal liver size. No liver mass. Normal gallbladder with no radiopaque cholelithiasis. No biliary ductal dilatation. Pancreas: Normal, with no mass or duct dilation. Spleen: Normal size. No mass. Adrenals/Urinary Tract: Normal adrenals. Well-positioned bilateral nephroureteral stents with proximal pigtail portions in the renal pelves and distal pigtail portions in the bladder lumen bilaterally. Mild-to-moderate bilateral hydroureteronephrosis has worsened bilaterally since 07/15/2022 CT. No renal masses. Nondistended unremarkable bladder. Stomach/Bowel: Normal non-distended stomach. Enteroenterostomy suture line noted in the left lower quadrant. Several mildly dilated small bowel loops throughout the abdomen measuring up to 4.8 cm diameter with associated air-fluid levels. No discrete focal small bowel caliber transition. No small bowel wall thickening. Appendix not discretely visualized. Status post end colostomy in the ventral left abdominal wall with low anterior resection. No colonic wall thickening, diverticulosis or acute pericolonic fat stranding in the remnant large-bowel. No appreciable change in indistinct soft tissue encasing pelvic ureters bilaterally (series 2/image 74).  No appreciable change in presacral space mixed fluid and soft tissue attenuation measuring up to 5.4 cm AP thickness and 8.1 cm transverse dimension (series 2/image 79). Vascular/Lymphatic: Atherosclerotic nonaneurysmal abdominal aorta. Patent portal, splenic, hepatic and renal veins. Stable mildly enlarged 1.0 cm left common iliac node (series 2/image 59). Stable mild left para-aortic adenopathy up to 1.2 cm (series 2/image 53). Reproductive: Hysterectomy.  No adnexal masses. Other: No pneumoperitoneum.  No ascites.  No new fluid collections. Musculoskeletal: No aggressive appearing focal osseous lesions. Bilateral posterior spinal fusion hardware at L4-5. Marked lower lumbar degenerative disc disease. IMPRESSION: 1. Well-positioned bilateral nephroureteral stents.  Mild-to-moderate bilateral hydroureteronephrosis has worsened bilaterally since 07/15/2022 CT, cannot exclude stent dysfunction. Recommend correlation with serum creatinine. 2. Several mildly dilated small bowel loops throughout the abdomen with associated air-fluid levels. No discrete focal small bowel caliber transition. Findings suggest mechanical distal small bowel obstruction such as due to adhesions. 3. No appreciable change in indistinct soft tissue encasing the pelvic ureters bilaterally, previously described as worrisome for local tumor recurrence. Stable thick presacral space mixed soft tissue and fluid attenuation. 4. Stable mild left para-aortic and left common iliac lymphadenopathy, cannot exclude nodal metastases. 5.  Aortic Atherosclerosis (ICD10-I70.0). Electronically Signed   By: Ilona Sorrel M.D.   On: 07/27/2022 17:15   DG Abd Portable 1V-Small Bowel Obstruction Protocol-initial, 8 hr delay  Result Date: 07/18/2022 CLINICAL DATA:  Small-bowel obstruction EXAM: PORTABLE ABDOMEN - 1 VIEW COMPARISON:  X-ray 07/16/2022.  CT 07/15/2022 FINDINGS: Fixation hardware of the lower lumbar spine with curvature of the spine and degenerative  changes. Bilateral indwelling ureteral stents in place as on prior. There is contrast seen scattered in the right side of the colon and transverse colon. Gas is seen in nondilated loops of small large bowel. No frank obstruction. No definite free air on these portable supine radiographs. Well rounded densities are noted in the pelvis which are indeterminate although possibly vascular. IMPRESSION: Decreasing small bowel distention. Contrast seen along nondilated loops of colon. Electronically Signed   By: Jill Side M.D.   On: 07/18/2022 17:54   DG Abd Portable 1V  Result Date: 07/16/2022 CLINICAL DATA:  Small-bowel obstruction EXAM: PORTABLE ABDOMEN - 1 VIEW COMPARISON:  CT 07/15/2022 FINDINGS: There are persistent mildly dilated small bowel loops in the lower abdomen. Degree of distention is similar to recent CT. There is mild gaseous distention of the colon. Bilateral nephroureteral stents are in place. Pelvic phleboliths. Prior lumbar fusion. IMPRESSION: Persistent mildly dilated loops of small bowel in the lower abdomen, compatible with obstruction as seen on recent CT. Electronically Signed   By: Maurine Simmering M.D.   On: 07/16/2022 09:16   CT ABDOMEN PELVIS W CONTRAST  Result Date: 07/15/2022 CLINICAL DATA:  Abdominal pain, nausea and vomiting, with colostomy and history of rectal cancer with resection and recurrence August 2023. EXAM: CT ABDOMEN AND PELVIS WITH CONTRAST TECHNIQUE: Multidetector CT imaging of the abdomen and pelvis was performed using the standard protocol following bolus administration of intravenous contrast. RADIATION DOSE REDUCTION: This exam was performed according to the departmental dose-optimization program which includes automated exposure control, adjustment of the mA and/or kV according to patient size and/or use of iterative reconstruction technique. CONTRAST:  142m OMNIPAQUE IOHEXOL 300 MG/ML  SOLN COMPARISON:  PET-CT 03/06/2022, CT without contrast 02/15/22. FINDINGS: Lower  chest: No acute abnormality. Hepatobiliary: Hepatic steatosis. No mass enhancement. Unremarkable gallbladder and bile ducts. Pancreas: Partially atrophic.  No other abnormality. Spleen: Mildly prominent, 13.6 cm coronal.  No mass. Adrenals/Urinary Tract: No adrenal or renal mass is seen. There is fetal lobation of both kidneys, scattered cortical scarring on the left. Interval bilateral ureteral stent insertions decompression of the renal collecting systems. The bladder is contracted and not well seen but was previously thickened on both prior CTs. Stomach/Bowel: Unremarkable gastric wall. Dilatation of upper to mid abdominal small bowel primarily due to the left is again noted up to 4.4 cm. Similar findings were noted on 02/15/2022 but the bowel had decompressed as of 03/06/2022. Left lower quadrant transition suspected on axial images 64-71 of series 2 and etiology most likely due  to adhesions, with an angulated partially feces filled segment in the area of transition. Remaining small bowel is normal caliber. The large bowel wall is largely contracted. An appendix is not seen. There is a descending colostomy and prior abdominal perineal resection. No bowel pneumatosis is seen. Vascular/Lymphatic: There is mild aortic calcific plaque. Again noted are slightly prominent left common iliac chain nodes up to 1 cm in short axis, at least 1 of which showed productivity There are few bilateral borderline sized inguinal chain nodes. One of these on the left demonstrated PET activity previously, specifically on series 2 axial 98. There is interval enlargement of right lower quadrant mesenteric nodes which could be metastatic. Largest of these is 1.4 cm in short axis on 2:70. No other significant lymph nodes are seen. Reproductive: The uterus is absent. Left adnexal abutting cystic structures up to 2.4 cm are unchanged. Other: Presacral soft tissue thickening extending down to the posterior pelvic floor, strongly PET  positive, is again noted. The greatest diameter of this is 8.3 x 6.1 cm on 2:87. It is not significantly changed in overall size. There is an air-fluid collection within this at the level of the coccyx and just above the pelvic floor measuring 4.9 x 1.8 cm. This was also seen previously with scattered air pockets in the soft tissue above this level, unknown if this represents small-bowel engulfed by tumor or whether there could be a small bowel fistula to the area with infectious process, but this is similar in appearance to the previous studies. Musculoskeletal: There is levoscoliosis, degenerative and postsurgical changes of the lumbar spine with L4-5 posterior rods and pedicle screws and interbody metal hardware, advanced disc collapse at L3-4. No destructive bone lesion is seen. IMPRESSION: 1. Small-bowel obstruction, at least of intermediate-grade, with transition point in the left lower quadrant, most likely due to adhesions. 2. Abdominal perineal resection for rectal carcinoma with left upper abdominal descending colostomy. 3. Presacral soft tissue thickening consistent with recurrent tumor and PET positive, extending down to the posterior pelvic floor, not significantly changed in overall size. 4. There is an air-fluid collection within this at the level of the coccyx and just above the pelvic floor measuring 4.9 x 1.8 cm. This was also seen previously with scattered air pockets in the soft tissue above this level, unknown if this represents small-bowel engulfed by tumor or whether there could be a small bowel fistula to the area with infectious process, but this is similar in appearance to the previous studies. 5. Interval enlargement of right lower quadrant mesenteric nodes which could be metastatic. 6. Interval bilateral ureteral stent placement with decompression of the renal collecting systems. 7. Hepatic steatosis and mild splenomegaly. 8. Aortic atherosclerosis. Aortic Atherosclerosis (ICD10-I70.0).  Electronically Signed   By: Telford Nab M.D.   On: 07/15/2022 07:06    Microbiology: Recent Results (from the past 240 hour(s))  Urine Culture     Status: Abnormal   Collection Time: 07/30/22 10:00 PM   Specimen: Urine, Clean Catch  Result Value Ref Range Status   Specimen Description   Final    URINE, CLEAN CATCH Performed at [facility], [place] [address], [place], [place] [address]    Special Requests   Final    NONE Performed at [facility], [place] [address] [place], [place] [address]    Culture MULTIPLE SPECIES PRESENT, SUGGEST RECOLLECTION (A)  Final   Report Status 07/31/2022 FINAL  Final     Labs: Basic Metabolic  Panel: Recent Labs  Lab 07/28/22 0355 07/28/22 1713 07/29/22 0340 07/30/22 0347 07/30/22 0631 07/31/22 0351 08/02/22 0336  NA 139  --  140 138  --  136 138  K 3.4*  --  3.2* 3.8  --  3.4* 4.0  CL 107  --  109 106  --  102 104  CO2 24  --  24 24  --  26 22  GLUCOSE 115*  --  93 101*  --  99 112*  BUN 10  --  10 7  --  6 <5*  CREATININE 0.76  --  0.72 0.74  --  0.68 0.77  CALCIUM 9.0  --  8.7* 8.9  --  9.1 9.3  MG 1.9 1.7  --  2.0 2.1  --  1.8  PHOS 2.9  --  3.2 3.6  --   --  4.3   Liver Function Tests: Recent Labs  Lab 07/27/22 1343 07/28/22 0355 07/29/22 0340 07/30/22 0347  AST 18 18  --   --   ALT 31 24  --   --   ALKPHOS 73 56  --   --   BILITOT 0.4 0.5  --   --   PROT 8.7* 7.1  --   --   ALBUMIN 4.0 3.1* 3.2* 3.0*   Recent Labs  Lab 07/27/22 1343  LIPASE <10*   No results for input(s): "AMMONIA" in the last 168 hours. CBC: Recent Labs  Lab 07/27/22 1343 07/28/22 0355 07/31/22 0351  WBC 11.0* 8.8 7.7  NEUTROABS  --  6.4  --   HGB 13.1 11.2* 11.1*  HCT 41.3 36.7 36.1  MCV 81.0 83.8 83.0  PLT 399 323 261   Cardiac Enzymes: No results for input(s): "CKTOTAL", "CKMB", "CKMBINDEX", "TROPONINI" in the last 168 hours. BNP: BNP (last 3 results) No results for input(s): "BNP" in the last  8760 hours.  ProBNP (last 3 results) No results for input(s): "PROBNP" in the last 8760 hours.  CBG: No results for input(s): "GLUCAP" in the last 168 hours.  FURTHER DISCHARGE INSTRUCTIONS:   Get Medicines reviewed and adjusted: Please take all your medications with you for your next visit with your Primary MD   Laboratory/radiological data: Please request your Primary MD to go over all hospital tests and procedure/radiological results at the follow up, please ask your Primary MD to get all Hospital records sent to his/her office.   In some cases, they will be blood work, cultures and biopsy results pending at the time of your discharge. Please request that your primary care M.D. goes through all the records of your hospital data and follows up on these results.   Also Note the following: If you experience worsening of your admission symptoms, develop shortness of breath, life threatening emergency, suicidal or homicidal thoughts you must seek medical attention immediately by calling 911 or calling your MD immediately  if symptoms less severe.   You must read complete instructions/literature along with all the possible adverse reactions/side effects for all the Medicines you take and that have been prescribed to you. Take any new Medicines after you have completely understood and accpet all the possible adverse reactions/side effects.    Do not drive when taking Pain medications or sleeping medications (Benzodaizepines)   Do not take more than prescribed Pain, Sleep and Anxiety Medications. It is not advisable to combine anxiety,sleep and pain medications without talking with your primary care practitioner   Special Instructions: If you have smoked or chewed  Tobacco  in the last 2 yrs please stop smoking, stop any regular Alcohol  and or any Recreational drug use.   Wear Seat belts while driving.   Please note: You were cared for by a hospitalist during your hospital stay. Once you  are discharged, your primary care physician will handle any further medical issues. Please note that NO REFILLS for any discharge medications will be authorized once you are discharged, as it is imperative that you return to your primary care physician (or establish a relationship with a primary care physician if you do not have one) for your post hospital discharge needs so that they can reassess your need for medications and monitor your lab values.     Signed:  Florencia Reasons MD, PhD, FACP  [facility] 08/03/2022, 9:28 AM

## 2022-08-28 DIAGNOSIS — K219 Gastro-esophageal reflux disease without esophagitis: Secondary | ICD-10-CM | POA: Diagnosis not present

## 2022-08-28 DIAGNOSIS — K56609 Unspecified intestinal obstruction, unspecified as to partial versus complete obstruction: Secondary | ICD-10-CM | POA: Diagnosis not present

## 2022-08-28 DIAGNOSIS — R630 Anorexia: Secondary | ICD-10-CM | POA: Diagnosis not present

## 2022-08-28 DIAGNOSIS — C20 Malignant neoplasm of rectum: Secondary | ICD-10-CM | POA: Diagnosis not present

## 2022-09-16 DIAGNOSIS — N3945 Continuous leakage: Secondary | ICD-10-CM | POA: Diagnosis not present

## 2022-09-16 DIAGNOSIS — R310 Gross hematuria: Secondary | ICD-10-CM | POA: Insufficient documentation

## 2022-09-16 DIAGNOSIS — N3946 Mixed incontinence: Secondary | ICD-10-CM | POA: Diagnosis not present

## 2022-09-16 DIAGNOSIS — N3281 Overactive bladder: Secondary | ICD-10-CM | POA: Diagnosis not present

## 2022-09-16 DIAGNOSIS — N3289 Other specified disorders of bladder: Secondary | ICD-10-CM | POA: Diagnosis not present

## 2022-09-16 DIAGNOSIS — N1330 Unspecified hydronephrosis: Secondary | ICD-10-CM | POA: Diagnosis not present

## 2022-09-20 DIAGNOSIS — N3945 Continuous leakage: Secondary | ICD-10-CM | POA: Diagnosis not present

## 2022-09-20 DIAGNOSIS — I10 Essential (primary) hypertension: Secondary | ICD-10-CM | POA: Diagnosis not present

## 2022-09-20 DIAGNOSIS — N131 Hydronephrosis with ureteral stricture, not elsewhere classified: Secondary | ICD-10-CM | POA: Diagnosis not present

## 2022-09-20 DIAGNOSIS — N3281 Overactive bladder: Secondary | ICD-10-CM | POA: Diagnosis not present

## 2022-09-20 DIAGNOSIS — Z923 Personal history of irradiation: Secondary | ICD-10-CM | POA: Diagnosis not present

## 2022-09-20 DIAGNOSIS — N1330 Unspecified hydronephrosis: Secondary | ICD-10-CM | POA: Diagnosis not present

## 2022-09-20 DIAGNOSIS — C20 Malignant neoplasm of rectum: Secondary | ICD-10-CM | POA: Diagnosis not present

## 2022-09-20 DIAGNOSIS — Z79899 Other long term (current) drug therapy: Secondary | ICD-10-CM | POA: Diagnosis not present

## 2022-10-03 DIAGNOSIS — Z85048 Personal history of other malignant neoplasm of rectum, rectosigmoid junction, and anus: Secondary | ICD-10-CM | POA: Diagnosis not present

## 2022-10-03 DIAGNOSIS — I10 Essential (primary) hypertension: Secondary | ICD-10-CM | POA: Diagnosis not present

## 2022-10-03 DIAGNOSIS — F3341 Major depressive disorder, recurrent, in partial remission: Secondary | ICD-10-CM | POA: Diagnosis not present

## 2022-10-03 DIAGNOSIS — R42 Dizziness and giddiness: Secondary | ICD-10-CM | POA: Diagnosis not present

## 2022-10-03 DIAGNOSIS — G894 Chronic pain syndrome: Secondary | ICD-10-CM | POA: Diagnosis not present

## 2022-10-03 DIAGNOSIS — N939 Abnormal uterine and vaginal bleeding, unspecified: Secondary | ICD-10-CM | POA: Diagnosis not present

## 2022-10-03 DIAGNOSIS — Z933 Colostomy status: Secondary | ICD-10-CM | POA: Diagnosis not present

## 2022-10-04 ENCOUNTER — Ambulatory Visit
Admission: RE | Admit: 2022-10-04 | Discharge: 2022-10-04 | Disposition: A | Discharge status: Home / Self Care | Payer: Medicare HMO | Source: Ambulatory Visit | Attending: Family Medicine | Admitting: Family Medicine

## 2022-10-04 ENCOUNTER — Encounter: Payer: Self-pay | Admitting: Hematology

## 2022-10-04 ENCOUNTER — Other Ambulatory Visit: Payer: Self-pay | Admitting: Family Medicine

## 2022-10-04 DIAGNOSIS — R109 Unspecified abdominal pain: Secondary | ICD-10-CM

## 2022-10-04 DIAGNOSIS — D72820 Lymphocytosis (symptomatic): Secondary | ICD-10-CM | POA: Diagnosis not present

## 2022-10-04 DIAGNOSIS — K760 Fatty (change of) liver, not elsewhere classified: Secondary | ICD-10-CM | POA: Diagnosis not present

## 2022-10-04 DIAGNOSIS — Z933 Colostomy status: Secondary | ICD-10-CM | POA: Diagnosis not present

## 2022-10-04 MED ORDER — IOPAMIDOL (ISOVUE-300) INJECTION 61%
100.0000 mL | Freq: Once | INTRAVENOUS | Status: AC | PRN
  Administered 2022-10-04: 100 mL via INTRAVENOUS

## 2022-10-08 DIAGNOSIS — G620 Drug-induced polyneuropathy: Secondary | ICD-10-CM | POA: Diagnosis not present

## 2022-10-08 DIAGNOSIS — Z85048 Personal history of other malignant neoplasm of rectum, rectosigmoid junction, and anus: Secondary | ICD-10-CM | POA: Diagnosis not present

## 2022-10-08 DIAGNOSIS — M4726 Other spondylosis with radiculopathy, lumbar region: Secondary | ICD-10-CM | POA: Diagnosis not present

## 2022-10-08 DIAGNOSIS — C20 Malignant neoplasm of rectum: Secondary | ICD-10-CM | POA: Diagnosis not present

## 2022-10-08 DIAGNOSIS — M5136 Other intervertebral disc degeneration, lumbar region: Secondary | ICD-10-CM | POA: Diagnosis not present

## 2022-10-08 DIAGNOSIS — G894 Chronic pain syndrome: Secondary | ICD-10-CM | POA: Diagnosis not present

## 2022-10-08 DIAGNOSIS — T451X5A Adverse effect of antineoplastic and immunosuppressive drugs, initial encounter: Secondary | ICD-10-CM | POA: Diagnosis not present

## 2022-10-08 DIAGNOSIS — M48061 Spinal stenosis, lumbar region without neurogenic claudication: Secondary | ICD-10-CM | POA: Diagnosis not present

## 2022-10-08 DIAGNOSIS — M47816 Spondylosis without myelopathy or radiculopathy, lumbar region: Secondary | ICD-10-CM | POA: Diagnosis not present

## 2022-10-24 DIAGNOSIS — Z79899 Other long term (current) drug therapy: Secondary | ICD-10-CM | POA: Diagnosis not present

## 2022-10-24 DIAGNOSIS — Z5181 Encounter for therapeutic drug level monitoring: Secondary | ICD-10-CM | POA: Diagnosis not present

## 2022-10-24 DIAGNOSIS — M961 Postlaminectomy syndrome, not elsewhere classified: Secondary | ICD-10-CM | POA: Diagnosis not present

## 2022-10-24 DIAGNOSIS — C20 Malignant neoplasm of rectum: Secondary | ICD-10-CM | POA: Diagnosis not present

## 2022-10-26 ENCOUNTER — Other Ambulatory Visit: Payer: Self-pay

## 2022-10-26 ENCOUNTER — Encounter (HOSPITAL_BASED_OUTPATIENT_CLINIC_OR_DEPARTMENT_OTHER): Payer: Self-pay

## 2022-10-26 ENCOUNTER — Emergency Department (HOSPITAL_BASED_OUTPATIENT_CLINIC_OR_DEPARTMENT_OTHER): Payer: Medicare HMO

## 2022-10-26 ENCOUNTER — Inpatient Hospital Stay (HOSPITAL_BASED_OUTPATIENT_CLINIC_OR_DEPARTMENT_OTHER)
Admission: EM | Admit: 2022-10-26 | Discharge: 2022-10-31 | DRG: 389 | Disposition: A | Discharge status: Home / Self Care | Payer: Medicare HMO | Attending: Internal Medicine | Admitting: Internal Medicine

## 2022-10-26 DIAGNOSIS — Z8249 Family history of ischemic heart disease and other diseases of the circulatory system: Secondary | ICD-10-CM | POA: Diagnosis not present

## 2022-10-26 DIAGNOSIS — R110 Nausea: Secondary | ICD-10-CM | POA: Diagnosis not present

## 2022-10-26 DIAGNOSIS — Z923 Personal history of irradiation: Secondary | ICD-10-CM

## 2022-10-26 DIAGNOSIS — K56600 Partial intestinal obstruction, unspecified as to cause: Principal | ICD-10-CM | POA: Diagnosis present

## 2022-10-26 DIAGNOSIS — Z9221 Personal history of antineoplastic chemotherapy: Secondary | ICD-10-CM

## 2022-10-26 DIAGNOSIS — N179 Acute kidney failure, unspecified: Secondary | ICD-10-CM

## 2022-10-26 DIAGNOSIS — Z882 Allergy status to sulfonamides status: Secondary | ICD-10-CM

## 2022-10-26 DIAGNOSIS — M19012 Primary osteoarthritis, left shoulder: Secondary | ICD-10-CM | POA: Diagnosis present

## 2022-10-26 DIAGNOSIS — Z87891 Personal history of nicotine dependence: Secondary | ICD-10-CM | POA: Diagnosis not present

## 2022-10-26 DIAGNOSIS — G40909 Epilepsy, unspecified, not intractable, without status epilepticus: Secondary | ICD-10-CM | POA: Diagnosis not present

## 2022-10-26 DIAGNOSIS — K219 Gastro-esophageal reflux disease without esophagitis: Secondary | ICD-10-CM | POA: Diagnosis present

## 2022-10-26 DIAGNOSIS — R42 Dizziness and giddiness: Secondary | ICD-10-CM | POA: Diagnosis not present

## 2022-10-26 DIAGNOSIS — E785 Hyperlipidemia, unspecified: Secondary | ICD-10-CM | POA: Diagnosis present

## 2022-10-26 DIAGNOSIS — Z833 Family history of diabetes mellitus: Secondary | ICD-10-CM

## 2022-10-26 DIAGNOSIS — Z8349 Family history of other endocrine, nutritional and metabolic diseases: Secondary | ICD-10-CM

## 2022-10-26 DIAGNOSIS — Z85048 Personal history of other malignant neoplasm of rectum, rectosigmoid junction, and anus: Secondary | ICD-10-CM

## 2022-10-26 DIAGNOSIS — Z6841 Body Mass Index (BMI) 40.0 and over, adult: Secondary | ICD-10-CM

## 2022-10-26 DIAGNOSIS — E039 Hypothyroidism, unspecified: Secondary | ICD-10-CM | POA: Diagnosis not present

## 2022-10-26 DIAGNOSIS — K567 Ileus, unspecified: Principal | ICD-10-CM

## 2022-10-26 DIAGNOSIS — Z933 Colostomy status: Secondary | ICD-10-CM

## 2022-10-26 DIAGNOSIS — Z8719 Personal history of other diseases of the digestive system: Secondary | ICD-10-CM

## 2022-10-26 DIAGNOSIS — G894 Chronic pain syndrome: Secondary | ICD-10-CM | POA: Diagnosis present

## 2022-10-26 DIAGNOSIS — E669 Obesity, unspecified: Secondary | ICD-10-CM | POA: Diagnosis present

## 2022-10-26 DIAGNOSIS — K5669 Other partial intestinal obstruction: Secondary | ICD-10-CM | POA: Diagnosis not present

## 2022-10-26 DIAGNOSIS — Z880 Allergy status to penicillin: Secondary | ICD-10-CM

## 2022-10-26 DIAGNOSIS — Z823 Family history of stroke: Secondary | ICD-10-CM | POA: Diagnosis not present

## 2022-10-26 DIAGNOSIS — Z803 Family history of malignant neoplasm of breast: Secondary | ICD-10-CM

## 2022-10-26 DIAGNOSIS — E872 Acidosis, unspecified: Secondary | ICD-10-CM | POA: Diagnosis present

## 2022-10-26 DIAGNOSIS — Z881 Allergy status to other antibiotic agents status: Secondary | ICD-10-CM

## 2022-10-26 DIAGNOSIS — M1712 Unilateral primary osteoarthritis, left knee: Secondary | ICD-10-CM | POA: Diagnosis present

## 2022-10-26 DIAGNOSIS — Z0389 Encounter for observation for other suspected diseases and conditions ruled out: Secondary | ICD-10-CM | POA: Diagnosis not present

## 2022-10-26 DIAGNOSIS — Z800 Family history of malignant neoplasm of digestive organs: Secondary | ICD-10-CM

## 2022-10-26 DIAGNOSIS — N1330 Unspecified hydronephrosis: Secondary | ICD-10-CM | POA: Diagnosis not present

## 2022-10-26 DIAGNOSIS — K56609 Unspecified intestinal obstruction, unspecified as to partial versus complete obstruction: Secondary | ICD-10-CM

## 2022-10-26 DIAGNOSIS — Z79890 Hormone replacement therapy (postmenopausal): Secondary | ICD-10-CM | POA: Diagnosis not present

## 2022-10-26 DIAGNOSIS — E7800 Pure hypercholesterolemia, unspecified: Secondary | ICD-10-CM | POA: Diagnosis present

## 2022-10-26 DIAGNOSIS — Z888 Allergy status to other drugs, medicaments and biological substances status: Secondary | ICD-10-CM

## 2022-10-26 DIAGNOSIS — E860 Dehydration: Secondary | ICD-10-CM | POA: Diagnosis not present

## 2022-10-26 DIAGNOSIS — E861 Hypovolemia: Secondary | ICD-10-CM | POA: Diagnosis present

## 2022-10-26 DIAGNOSIS — R112 Nausea with vomiting, unspecified: Secondary | ICD-10-CM | POA: Diagnosis present

## 2022-10-26 DIAGNOSIS — R1084 Generalized abdominal pain: Secondary | ICD-10-CM | POA: Diagnosis not present

## 2022-10-26 DIAGNOSIS — K5090 Crohn's disease, unspecified, without complications: Secondary | ICD-10-CM | POA: Diagnosis not present

## 2022-10-26 DIAGNOSIS — M549 Dorsalgia, unspecified: Secondary | ICD-10-CM | POA: Diagnosis not present

## 2022-10-26 DIAGNOSIS — Z79899 Other long term (current) drug therapy: Secondary | ICD-10-CM

## 2022-10-26 DIAGNOSIS — I10 Essential (primary) hypertension: Secondary | ICD-10-CM | POA: Diagnosis present

## 2022-10-26 LAB — CBC WITH DIFFERENTIAL/PLATELET
Abs Immature Granulocytes: 0.1 10*3/uL — ABNORMAL HIGH (ref 0.00–0.07)
Basophils Absolute: 0.1 10*3/uL (ref 0.0–0.1)
Basophils Relative: 0 %
Eosinophils Absolute: 0 10*3/uL (ref 0.0–0.5)
Eosinophils Relative: 0 %
HCT: 48.4 % — ABNORMAL HIGH (ref 36.0–46.0)
Hemoglobin: 15.2 g/dL — ABNORMAL HIGH (ref 12.0–15.0)
Immature Granulocytes: 1 %
Lymphocytes Relative: 11 %
Lymphs Abs: 2 10*3/uL (ref 0.7–4.0)
MCH: 25.2 pg — ABNORMAL LOW (ref 26.0–34.0)
MCHC: 31.4 g/dL (ref 30.0–36.0)
MCV: 80.3 fL (ref 80.0–100.0)
Monocytes Absolute: 0.8 10*3/uL (ref 0.1–1.0)
Monocytes Relative: 4 %
Neutro Abs: 15.2 10*3/uL — ABNORMAL HIGH (ref 1.7–7.7)
Neutrophils Relative %: 84 %
Platelets: 541 10*3/uL — ABNORMAL HIGH (ref 150–400)
RBC: 6.03 MIL/uL — ABNORMAL HIGH (ref 3.87–5.11)
RDW: 15.7 % — ABNORMAL HIGH (ref 11.5–15.5)
WBC: 18.2 10*3/uL — ABNORMAL HIGH (ref 4.0–10.5)
nRBC: 0 % (ref 0.0–0.2)

## 2022-10-26 LAB — COMPREHENSIVE METABOLIC PANEL
ALT: 11 U/L (ref 0–44)
AST: 15 U/L (ref 15–41)
Albumin: 4.9 g/dL (ref 3.5–5.0)
Alkaline Phosphatase: 74 U/L (ref 38–126)
Anion gap: 15 (ref 5–15)
BUN: 16 mg/dL (ref 6–20)
CO2: 23 mmol/L (ref 22–32)
Calcium: 12.3 mg/dL — ABNORMAL HIGH (ref 8.9–10.3)
Chloride: 100 mmol/L (ref 98–111)
Creatinine, Ser: 1.1 mg/dL — ABNORMAL HIGH (ref 0.44–1.00)
GFR, Estimated: 59 mL/min — ABNORMAL LOW (ref >60)
Glucose, Bld: 173 mg/dL — ABNORMAL HIGH (ref 70–99)
Potassium: 3.9 mmol/L (ref 3.5–5.1)
Sodium: 138 mmol/L (ref 135–145)
Total Bilirubin: 0.7 mg/dL (ref 0.3–1.2)
Total Protein: 9.9 g/dL — ABNORMAL HIGH (ref 6.5–8.1)

## 2022-10-26 LAB — LACTIC ACID, PLASMA: Lactic Acid, Venous: 2.1 mmol/L (ref 0.5–1.9)

## 2022-10-26 LAB — LIPASE, BLOOD: Lipase: <10 U/L — ABNORMAL LOW (ref 11–51)

## 2022-10-26 MED ORDER — LACTATED RINGERS IV BOLUS
1000.0000 mL | Freq: Once | INTRAVENOUS | Status: AC
  Administered 2022-10-26: 1000 mL via INTRAVENOUS

## 2022-10-26 MED ORDER — ONDANSETRON HCL 4 MG/2ML IJ SOLN
4.0000 mg | Freq: Once | INTRAMUSCULAR | Status: AC
  Administered 2022-10-27: 4 mg via INTRAVENOUS
  Filled 2022-10-26: qty 2

## 2022-10-26 MED ORDER — ONDANSETRON HCL 4 MG/2ML IJ SOLN
4.0000 mg | Freq: Once | INTRAMUSCULAR | Status: AC
  Administered 2022-10-26: 4 mg via INTRAVENOUS
  Filled 2022-10-26: qty 2

## 2022-10-26 MED ORDER — HYDROMORPHONE HCL 1 MG/ML IJ SOLN
0.5000 mg | Freq: Once | INTRAMUSCULAR | Status: AC
  Administered 2022-10-26: 0.5 mg via INTRAVENOUS
  Filled 2022-10-26: qty 1

## 2022-10-26 MED ORDER — HYDROMORPHONE HCL 1 MG/ML IJ SOLN
0.5000 mg | Freq: Once | INTRAMUSCULAR | Status: AC
  Administered 2022-10-27: 0.5 mg via INTRAVENOUS
  Filled 2022-10-26: qty 1

## 2022-10-26 NOTE — ED Triage Notes (Signed)
Pt bib [facility] from home c/o n/v + diarrhea for one day. Blockage in January, ostomy intact. Reporting loose stools today. Chronic back pain, taken off narcotics on Thursday and put on new med:Marland Kitchen Belbuca. Took phenergan and zofran one hour ago with no relief, actively vomiting upon arrival.  BP 130 palp CBG 161  HR 118 SPO2 95% RA

## 2022-10-26 NOTE — ED Notes (Signed)
RT placed pt on Cool 2 Lpm for desaturation to 89% post medication administration. Pt BLBS clear/dim w/no distress noted at this time. Pt respiratory status stable. Pt sats on  2 Lpm w/sats of 97%.

## 2022-10-26 NOTE — ED Notes (Signed)
Dr. Gayleen Orem aware that lactic is 2.1

## 2022-10-26 NOTE — ED Provider Notes (Signed)
[place] EMERGENCY DEPARTMENT AT [facility] Provider Note   CSN: [id number] Arrival date & time: 10/26/22  2136     History  Chief Complaint  Patient presents with   Emesis    Lindsay Little is a 57 y.o. female with Crohn's disease, rectal CA, history of resection/colostomy, intra-abdominal abscesses and multiple recurrent SBO who presents with nausea/vomiting, abdominal pain.   Pt bib [facility] from home c/o n/v + watery stool that started this AM. Reports h/o several SBOs, most recently in January. Has colostomy. Reporting loose stools today. Chronic back pain, taken off narcotics on Thursday and put on new med, Belbuca. Took phenergan and zofran 5 hours ago with no relief, actively vomiting.    Emesis      Home Medications Prior to Admission medications   Medication Sig Start Date End Date Taking? Authorizing Provider  acetaminophen (TYLENOL) 500 MG tablet Take 1,000 mg by mouth every 8 (eight) hours as needed for moderate pain.    [provider]  bisoprolol (ZEBETA) 10 MG tablet Take 10 mg by mouth daily. 11/30/19   [provider]  celecoxib (CELEBREX) 100 MG capsule Take 100 mg by mouth 2 (two) times daily. 12/12/20   [provider]  Cholecalciferol (VITAMIN D3) 5000 units CAPS Take 5,000 Units by mouth at bedtime.     [provider]  Coenzyme Q10 (COQ10) 200 MG CAPS Take 200 mg by mouth at bedtime.    [provider]  DULoxetine (CYMBALTA) 60 MG capsule Take 120 mg by mouth daily.    [provider]  esomeprazole (NEXIUM) 40 MG capsule Take 1 capsule (40 mg total) by mouth 2 (two) times daily before a meal. 12/22/15   [name], MD  furosemide (LASIX) 20 MG tablet Take 20 mg by mouth daily as needed for edema.     [provider]  HYDROmorphone (DILAUDID) 4 MG tablet Take 1 tablet (4 mg total) by mouth every 6 (six) hours as needed for pain 07/10/22     levETIRAcetam (KEPPRA) 250 MG tablet Take  250 mg by mouth 2 (two) times daily.    [provider]  levothyroxine (SYNTHROID) 50 MCG tablet Take 50 mcg by mouth every morning. 02/07/22   [provider]  Melatonin 3 MG TABS Take 3-6 mg by mouth at bedtime as needed (sleep).    [provider]  Multiple Vitamin (MULTIVITAMIN WITH MINERALS) TABS tablet Take 2 tablets by mouth at bedtime.     [provider]  ondansetron (ZOFRAN) 4 MG tablet Take 1 tablet (4 mg total) by mouth every 6 (six) hours as needed for nausea. 07/20/22   Sherryll Burger, Pratik D, DO  oxybutynin (DITROPAN-XL) 10 MG 24 hr tablet Take 10 mg by mouth daily. 02/12/22   [provider]  polyethylene glycol (MIRALAX / GLYCOLAX) 17 g packet Take 17 g by mouth daily.    [provider]  pregabalin (LYRICA) 225 MG capsule Take 225 mg by mouth 2 (two) times daily. 02/07/22   [provider]  promethazine (PHENERGAN) 25 MG tablet TAKE ONE TABLET BY MOUTH EVERY 6 HOURS AS NEEDED FOR NAUSEA OR VOMITING Patient taking differently: Take 25 mg by mouth every 6 (six) hours as needed for nausea or vomiting. 07/04/20   Daiva Eves, Lisette Grinder, MD  rosuvastatin (CRESTOR) 5 MG tablet Take 5 mg by mouth at bedtime.    [provider]  triamcinolone lotion (KENALOG) 0.1 % Apply 1 application topically as needed (  dry skin).    [provider]  XTAMPZA ER 9 MG C12A Take 9 mg by mouth 2 (two) times daily.    [provider]      Allergies    Caine-1 [lidocaine], Bupropion, Sulfa antibiotics, Pantoprazole, Adhesive [tape], Doxycycline, Flagyl [metronidazole], Iron, Metoprolol, Oxycodone, and Penicillins    Review of Systems   Review of Systems  Gastrointestinal:  Positive for vomiting.   Review of systems Negative for f/c.  A 10 point review of systems was performed and is negative unless otherwise reported in HPI.  Physical Exam Updated Vital Signs BP (!) 165/126   Pulse (!) 120   Temp (!) 97.5 F (36.4 C) (Oral)    Resp 15   Ht 5\' 11"  (1.803 m)   Wt 131.5 kg   SpO2 94%   BMI 40.45 kg/m  Physical Exam General: Extremely uncomfortable appearing female, lying in bed.  HEENT: PERRLA, Sclera anicteric, MMM, trachea midline.  Cardiology: Regular tachycardic rate, no murmurs/rubs/gallops. BL radial and DP pulses equal bilaterally.  Resp: Normal respiratory rate and effort. CTAB, no wheezes, rhonchi, crackles.  Abd: Diffusely tender abdomen palpation without any signs of acute abdomen like rebound tenderness or guarding.  She has left upper quadrant colostomy noted with small volume nonbloody liquid brown stool in the bag.  No surrounding erythema induration or fluctuance of the ostomy. GU: Deferred. MSK: No peripheral edema or signs of trauma. Extremities without deformity or TTP. No cyanosis or clubbing. Skin: warm, dry Back: No CVA tenderness Neuro: A&Ox4, CNs II-XII grossly intact. MAEs. Sensation grossly intact.  Psych: Tearful  ED Results / Procedures / Treatments   Labs (all labs ordered are listed, but only abnormal results are displayed) Labs Reviewed  COMPREHENSIVE METABOLIC PANEL - Abnormal; Notable for the following components:      Result Value   Glucose, Bld 173 (*)    Creatinine, Ser 1.10 (*)    Calcium 12.3 (*)    Total Protein 9.9 (*)    GFR, Estimated 59 (*)    All other components within normal limits  LIPASE, BLOOD - Abnormal; Notable for the following components:   Lipase <10 (*)    All other components within normal limits  LACTIC ACID, PLASMA - Abnormal; Notable for the following components:   Lactic Acid, Venous 2.1 (*)    All other components within normal limits  LACTIC ACID, PLASMA - Abnormal; Notable for the following components:   Lactic Acid, Venous 2.2 (*)    All other components within normal limits    EKG EKG Interpretation  Date/Time:  Saturday October 26 2022 21:51:17 EDT Ventricular Rate:  119 PR Interval:  167 QRS Duration: 95 QT Interval:  324 QTC  Calculation: 456 R Axis:   36 Text Interpretation: Sinus tachycardia Left atrial enlargement Confirmed by Vivi Barrack [phone]) on 10/26/2022 11:21:08 PM  Radiology No results found.  Procedures Procedures    Medications Ordered in ED Medications  lactated ringers bolus 1,000 mL (0 mLs Intravenous Stopped 10/27/22 0014)  ondansetron (ZOFRAN) injection 4 mg (4 mg Intravenous Given 10/26/22 2247)  HYDROmorphone (DILAUDID) injection 0.5 mg (0.5 mg Intravenous Given 10/26/22 2247)    ED Course/ Medical Decision Making/ A&P                          Medical Decision Making Amount and/or Complexity of Data Reviewed Labs: ordered. Decision-making details documented in ED Course. Radiology: ordered.  Risk Prescription drug management.  Decision regarding hospitalization.    This patient presents to the ED for concern of N/V, c/f SBO; this involves an extensive number of treatment options, and is a complaint that carries with it a high risk of complications and morbidity.  I considered the following differential and admission for this acute, potentially life threatening condition.   MDM:    For DDX for abdominal pain includes but is not limited to:  Abdominal exam without peritoneal signs. No evidence of acute abdomen at this time.   Patient actively vomiting and states this feels exactly like prior episodes of SBO.  Greatest concern for SBO.  Will manage symptomatically with antiemetics and get CT abdomen pelvis.  Patient presents tachycardic in the setting of nausea vomiting likely due to hypovolemia but with abdominal pain must also consider intra-abdominal sepsis.  She has no focal tenderness of her belly that would indicate peritonitis but must also consider cholecystitis/cholangitis, appendicitis, diverticulitis.  Also considered acute pancreatitis, PUD, vascular catastrophe.   Clinical Course as of 10/26/22 2332  Sat Oct 26, 2022  2320 WBC(!): 18.2 +Leukocytosis [HN]  2320  Hemoglobin(!): 15.2 Likely volume contracted [HN]    Clinical Course User Index [HN] Loetta Rough, MD    Labs: I Ordered, and personally interpreted labs.  The pertinent results include:  those listed above  Imaging Studies ordered: I ordered imaging studies including CT abd pelvis pending at time of signout  Additional history obtained from chart review.    Cardiac Monitoring: The patient was maintained on a cardiac monitor.  I personally viewed and interpreted the cardiac monitored which showed an underlying rhythm of: sinus tachycardia  Reevaluation: After the interventions noted above, I reevaluated the patient and found that they have :improved  Social Determinants of Health: Patient lives independently   Disposition:  Patient is signed out to the oncoming ED physician Dr. Wilkie Aye who is made aware of her history, presentation, exam, workup, and plan.  Plan is to resuscitate with fluids, treat nausea and pain, and obtain CT abdomen pelvis.   Co morbidities that complicate the patient evaluation  Past Medical History:  Diagnosis Date   Anxiety    Chemotherapy-induced neuropathy    toes and fingers numbness and tingling   Colorectal delayed anastomotic leak s/p resection & colostomy 07/12/2016 11/17/2015   Colostomy in place 07/12/2016   due to anastomosis breakdown w/ colovaginal fistula   Colovaginal fistula s/p omentopexy repair 07/12/2016 07/10/2016   Complication of anesthesia    Depression    Family history of adverse reaction to anesthesia    mother-- ponv   Family history of breast cancer    Family history of pancreatic cancer    Family history of skin cancer    Gastroenteritis 12/20/2015   GERD (gastroesophageal reflux disease)    Hardware complicating wound infection 08/03/2019   Hiatal hernia    History of cancer chemotherapy 02-20-2015 to 03-30-2015   History of cardiac murmur as a child    History of chemotherapy    History of chronic gastritis     History of hypertension    no issues since multiple abdminal sx's and chemo--- no medication since 12/ 2016   History of TMJ disorder    Hypercholesteremia    Hypertension    IBS (irritable bowel syndrome)    dx age 26   Intermittent abdominal pain    post-op multiple abdominal sx's    Microcytic anemia    Mild sleep apnea    per pt study  2014  very mild osa , no cpap recommended, recommeded wt loss and sleep routine   OA (osteoarthritis)    left knee /  left shoulder   Obesity    Parastomal hernia s/p lap repair w mesh 07/23/2018 07/23/2018   Pelvic abscess s/p drainage & omental pedicle flap 11/17/2015 06/23/2015   PONV (postoperative nausea and vomiting)    severe" needs Scopolamine PATCH"    Portacath in place    right chest   Rectal adenocarcinoma oncologis-  dr Mosetta Putt-- after radiation/ chemo (ypT1, N0) --  no recurrence per last note 03/ 2018   dx 01-13-2015-- Stage IIIC (T3, N2, M0) post concurrent radiation and chemotherapy 02-20-2015 to 03-30-2015 /  05-25-2015 s/p  LAR w/ RSO (post-op complicated by late abscess and contained anatomotic leak w/ help percutaneous drainage and antibiotics)    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   I have reviewed the patients home medicines and have made adjustments as needed  Problem List / ED Course: Problem List Items Addressed This Visit       Digestive   Small bowel obstruction   Relevant Orders   DG Abd Portable 1V-Small Bowel Obstruction Protocol-initial, 8 hr delay (Completed)   Other Visit Diagnoses     Ileus    -  Primary   Dehydration       AKI (acute kidney injury)                       This note was created using dictation software, which may contain spelling or grammatical errors.    Loetta Rough, MD 11/01/22 1414

## 2022-10-26 NOTE — ED Notes (Signed)
Pt provided nausea and pain medication along with IV fluids. Pt resting in bed with call light in reach and update on POC.

## 2022-10-27 ENCOUNTER — Emergency Department (HOSPITAL_BASED_OUTPATIENT_CLINIC_OR_DEPARTMENT_OTHER): Payer: Medicare HMO

## 2022-10-27 DIAGNOSIS — K219 Gastro-esophageal reflux disease without esophagitis: Secondary | ICD-10-CM | POA: Diagnosis present

## 2022-10-27 DIAGNOSIS — Z8249 Family history of ischemic heart disease and other diseases of the circulatory system: Secondary | ICD-10-CM | POA: Diagnosis not present

## 2022-10-27 DIAGNOSIS — E669 Obesity, unspecified: Secondary | ICD-10-CM | POA: Diagnosis present

## 2022-10-27 DIAGNOSIS — Z6841 Body Mass Index (BMI) 40.0 and over, adult: Secondary | ICD-10-CM | POA: Diagnosis not present

## 2022-10-27 DIAGNOSIS — M19012 Primary osteoarthritis, left shoulder: Secondary | ICD-10-CM | POA: Diagnosis present

## 2022-10-27 DIAGNOSIS — R112 Nausea with vomiting, unspecified: Secondary | ICD-10-CM | POA: Diagnosis present

## 2022-10-27 DIAGNOSIS — Z0389 Encounter for observation for other suspected diseases and conditions ruled out: Secondary | ICD-10-CM | POA: Diagnosis not present

## 2022-10-27 DIAGNOSIS — Z85048 Personal history of other malignant neoplasm of rectum, rectosigmoid junction, and anus: Secondary | ICD-10-CM | POA: Diagnosis not present

## 2022-10-27 DIAGNOSIS — K56609 Unspecified intestinal obstruction, unspecified as to partial versus complete obstruction: Secondary | ICD-10-CM

## 2022-10-27 DIAGNOSIS — G894 Chronic pain syndrome: Secondary | ICD-10-CM | POA: Diagnosis present

## 2022-10-27 DIAGNOSIS — Z8719 Personal history of other diseases of the digestive system: Secondary | ICD-10-CM | POA: Diagnosis not present

## 2022-10-27 DIAGNOSIS — Z9221 Personal history of antineoplastic chemotherapy: Secondary | ICD-10-CM | POA: Diagnosis not present

## 2022-10-27 DIAGNOSIS — K567 Ileus, unspecified: Secondary | ICD-10-CM | POA: Diagnosis not present

## 2022-10-27 DIAGNOSIS — G40909 Epilepsy, unspecified, not intractable, without status epilepticus: Secondary | ICD-10-CM | POA: Diagnosis present

## 2022-10-27 DIAGNOSIS — Z87891 Personal history of nicotine dependence: Secondary | ICD-10-CM | POA: Diagnosis not present

## 2022-10-27 DIAGNOSIS — M1712 Unilateral primary osteoarthritis, left knee: Secondary | ICD-10-CM | POA: Diagnosis present

## 2022-10-27 DIAGNOSIS — Z933 Colostomy status: Secondary | ICD-10-CM | POA: Diagnosis not present

## 2022-10-27 DIAGNOSIS — Z8349 Family history of other endocrine, nutritional and metabolic diseases: Secondary | ICD-10-CM | POA: Diagnosis not present

## 2022-10-27 DIAGNOSIS — K56600 Partial intestinal obstruction, unspecified as to cause: Secondary | ICD-10-CM | POA: Diagnosis present

## 2022-10-27 DIAGNOSIS — E861 Hypovolemia: Secondary | ICD-10-CM | POA: Diagnosis present

## 2022-10-27 DIAGNOSIS — Z833 Family history of diabetes mellitus: Secondary | ICD-10-CM | POA: Diagnosis not present

## 2022-10-27 DIAGNOSIS — E860 Dehydration: Secondary | ICD-10-CM | POA: Diagnosis present

## 2022-10-27 DIAGNOSIS — K5090 Crohn's disease, unspecified, without complications: Secondary | ICD-10-CM | POA: Diagnosis present

## 2022-10-27 DIAGNOSIS — E872 Acidosis, unspecified: Secondary | ICD-10-CM | POA: Diagnosis present

## 2022-10-27 DIAGNOSIS — K5669 Other partial intestinal obstruction: Secondary | ICD-10-CM | POA: Diagnosis not present

## 2022-10-27 DIAGNOSIS — E7800 Pure hypercholesterolemia, unspecified: Secondary | ICD-10-CM | POA: Diagnosis present

## 2022-10-27 DIAGNOSIS — E039 Hypothyroidism, unspecified: Secondary | ICD-10-CM | POA: Diagnosis present

## 2022-10-27 DIAGNOSIS — Z79890 Hormone replacement therapy (postmenopausal): Secondary | ICD-10-CM | POA: Diagnosis not present

## 2022-10-27 DIAGNOSIS — N1330 Unspecified hydronephrosis: Secondary | ICD-10-CM | POA: Diagnosis not present

## 2022-10-27 DIAGNOSIS — I10 Essential (primary) hypertension: Secondary | ICD-10-CM | POA: Diagnosis present

## 2022-10-27 DIAGNOSIS — Z823 Family history of stroke: Secondary | ICD-10-CM | POA: Diagnosis not present

## 2022-10-27 LAB — CBC
HCT: 45.7 % (ref 36.0–46.0)
Hemoglobin: 13.9 g/dL (ref 12.0–15.0)
MCH: 25.2 pg — ABNORMAL LOW (ref 26.0–34.0)
MCHC: 30.4 g/dL (ref 30.0–36.0)
MCV: 82.8 fL (ref 80.0–100.0)
Platelets: 356 10*3/uL (ref 150–400)
RBC: 5.52 MIL/uL — ABNORMAL HIGH (ref 3.87–5.11)
RDW: 15.8 % — ABNORMAL HIGH (ref 11.5–15.5)
WBC: 15.4 10*3/uL — ABNORMAL HIGH (ref 4.0–10.5)
nRBC: 0 % (ref 0.0–0.2)

## 2022-10-27 LAB — LACTIC ACID, PLASMA
Lactic Acid, Venous: 2.2 mmol/L (ref 0.5–1.9)
Lactic Acid, Venous: 1.2 mmol/L (ref 0.5–1.9)

## 2022-10-27 LAB — CREATININE, SERUM
Creatinine, Ser: 0.97 mg/dL (ref 0.44–1.00)
GFR, Estimated: >60 mL/min (ref >60)

## 2022-10-27 MED ORDER — SODIUM CHLORIDE 0.9 % IV SOLN: INTRAVENOUS | Status: DC

## 2022-10-27 MED ORDER — ACETAMINOPHEN 650 MG RE SUPP: 650.0000 mg | Freq: Four times a day (QID) | RECTAL | Status: DC | PRN

## 2022-10-27 MED ORDER — DULOXETINE HCL 60 MG PO CPEP
120.0000 mg | ORAL_CAPSULE | Freq: Every day | ORAL | Status: DC
  Administered 2022-10-27 – 2022-10-31 (×5): 120 mg via ORAL
  Filled 2022-10-27 (×5): qty 2

## 2022-10-27 MED ORDER — LEVETIRACETAM 500 MG PO TABS
500.0000 mg | ORAL_TABLET | Freq: Two times a day (BID) | ORAL | Status: DC
  Administered 2022-10-27 – 2022-10-31 (×9): 500 mg via ORAL
  Filled 2022-10-27 (×9): qty 1

## 2022-10-27 MED ORDER — METOPROLOL TARTRATE 5 MG/5ML IV SOLN: 5.0000 mg | Freq: Four times a day (QID) | INTRAVENOUS | Status: DC | PRN

## 2022-10-27 MED ORDER — ONDANSETRON HCL 4 MG/2ML IJ SOLN
4.0000 mg | Freq: Four times a day (QID) | INTRAMUSCULAR | Status: DC | PRN
  Administered 2022-10-27 – 2022-10-31 (×7): 4 mg via INTRAVENOUS
  Filled 2022-10-27 (×8): qty 2

## 2022-10-27 MED ORDER — ENOXAPARIN SODIUM 60 MG/0.6ML IJ SOSY
60.0000 mg | PREFILLED_SYRINGE | INTRAMUSCULAR | Status: DC
  Administered 2022-10-27 – 2022-10-30 (×4): 60 mg via SUBCUTANEOUS
  Filled 2022-10-27 (×4): qty 0.6

## 2022-10-27 MED ORDER — LEVETIRACETAM 250 MG PO TABS: 250.0000 mg | ORAL_TABLET | Freq: Two times a day (BID) | ORAL | Status: DC

## 2022-10-27 MED ORDER — OXYBUTYNIN CHLORIDE ER 5 MG PO TB24: 10.0000 mg | ORAL_TABLET | Freq: Every day | ORAL | Status: DC

## 2022-10-27 MED ORDER — HYDROMORPHONE HCL 4 MG PO TABS
4.0000 mg | ORAL_TABLET | Freq: Four times a day (QID) | ORAL | Status: DC | PRN
  Administered 2022-10-27 – 2022-10-31 (×5): 4 mg via ORAL
  Filled 2022-10-27 (×7): qty 1

## 2022-10-27 MED ORDER — OXYBUTYNIN CHLORIDE ER 5 MG PO TB24
15.0000 mg | ORAL_TABLET | Freq: Every day | ORAL | Status: DC
  Administered 2022-10-27 – 2022-10-30 (×4): 15 mg via ORAL
  Filled 2022-10-27 (×4): qty 3

## 2022-10-27 MED ORDER — HYDROMORPHONE HCL 1 MG/ML IJ SOLN
1.0000 mg | INTRAMUSCULAR | Status: DC | PRN
  Administered 2022-10-27 – 2022-10-30 (×19): 1 mg via INTRAVENOUS
  Filled 2022-10-27 (×19): qty 1

## 2022-10-27 MED ORDER — PANTOPRAZOLE SODIUM 40 MG PO TBEC: 40.0000 mg | DELAYED_RELEASE_TABLET | Freq: Every day | ORAL | Status: DC

## 2022-10-27 MED ORDER — PANTOPRAZOLE SODIUM 40 MG IV SOLR
40.0000 mg | Freq: Two times a day (BID) | INTRAVENOUS | Status: DC
  Administered 2022-10-27 – 2022-10-30 (×7): 40 mg via INTRAVENOUS
  Filled 2022-10-27 (×7): qty 10

## 2022-10-27 MED ORDER — LACTATED RINGERS IV BOLUS
1000.0000 mL | Freq: Once | INTRAVENOUS | Status: AC
  Administered 2022-10-27: 1000 mL via INTRAVENOUS

## 2022-10-27 MED ORDER — MORPHINE SULFATE (PF) 4 MG/ML IV SOLN
4.0000 mg | Freq: Once | INTRAVENOUS | Status: AC
  Administered 2022-10-27: 4 mg via INTRAVENOUS
  Filled 2022-10-27: qty 1

## 2022-10-27 MED ORDER — PREGABALIN 75 MG PO CAPS
225.0000 mg | ORAL_CAPSULE | Freq: Two times a day (BID) | ORAL | Status: DC
  Administered 2022-10-27 – 2022-10-31 (×9): 225 mg via ORAL
  Filled 2022-10-27 (×9): qty 3

## 2022-10-27 MED ORDER — ACETAMINOPHEN 325 MG PO TABS: 650.0000 mg | ORAL_TABLET | Freq: Four times a day (QID) | ORAL | Status: DC | PRN

## 2022-10-27 MED ORDER — HYDROMORPHONE HCL 1 MG/ML IJ SOLN
0.5000 mg | Freq: Once | INTRAMUSCULAR | Status: AC
  Administered 2022-10-27: 0.5 mg via INTRAVENOUS
  Filled 2022-10-27: qty 1

## 2022-10-27 MED ORDER — IOHEXOL 300 MG/ML  SOLN
100.0000 mL | Freq: Once | INTRAMUSCULAR | Status: AC | PRN
  Administered 2022-10-27: 100 mL via INTRAVENOUS

## 2022-10-27 MED ORDER — LEVOTHYROXINE SODIUM 50 MCG PO TABS
50.0000 ug | ORAL_TABLET | Freq: Every morning | ORAL | Status: DC
  Administered 2022-10-28 – 2022-10-31 (×4): 50 ug via ORAL
  Filled 2022-10-27 (×4): qty 1

## 2022-10-27 NOTE — ED Provider Notes (Signed)
Patient signed out pending CT scan.  CT scan reviewed.  No definitive obstruction; however, she does have some dilated loops of bowel.  Clinically she remains tachycardic, with abdominal pain.  High output from her colostomy.  Clinically most concerning for ileus versus early obstruction.  Second liter of fluids ordered.  Pain continues to be managed.  Feel that she is best served for admission for medical management of early obstructive symptoms.  Hospitalist consulted.   Shon Baton, MD 10/27/22 [phone]

## 2022-10-27 NOTE — ED Notes (Signed)
Pt resting; updated POC by MD. Call light in reach.

## 2022-10-27 NOTE — ED Notes (Signed)
Pt back from CT and resting- . Call light in reach.

## 2022-10-27 NOTE — ED Notes (Signed)
Pt in CT.

## 2022-10-27 NOTE — H&P (Signed)
History and Physical  [name] AVW:[id number] DOB: 1966-06-19 DOA: 10/26/2022  PCP: Laurann Montana, MD   Chief Complaint: abd pain, nausea   HPI: Lindsay Little is a 57 y.o. female with medical history significant for hypertension, hyperlipidemia, rectal cancer with history of resection/colostomy, multiple abdominal surgeries due to intra-abdominal abscesses and recurrent SBO who presents with nausea vomiting and abdominal pain.  She has been having nausea and vomiting with watery output in her ostomy since yesterday morning.  Says that she always has abdominal pain, so it is hard to describe pain that she is having, but she does have bilateral upper quadrant abdominal pain which is more severe than usual.  Denies any fevers or chills, any chest pain or blood in her emesis or ostomy output.  She last vomited just when she arrived at [facility], currently nauseous but not vomiting.  Started the new pain medication Belbuca a couple of days ago and was taken off of her chronic narcotics, but she has been so nauseous she has been unable to take that medication.  ED Course: Upon evaluation in the emergency department, she was found to be hypertensive with some tachycardia but otherwise hemodynamically stable and afebrile.  White blood cell count 18,000, creatinine 1.1, lactate 2.1 and 2.2 on repeat.  She was given 2 L fluid bolus, pain and nausea medication and hospitalist admission was requested.  She was then transferred from [facility] to [facility] for further care.  Review of Systems: Please see HPI for pertinent positives and negatives. A complete 10 system review of systems are otherwise negative.  Past Medical History:  Diagnosis Date   Anxiety    Chemotherapy-induced neuropathy    toes and fingers numbness and tingling   Colorectal delayed anastomotic leak s/p resection & colostomy 07/12/2016 11/17/2015   Colostomy in place 07/12/2016   due to anastomosis breakdown w/ colovaginal  fistula   Colovaginal fistula s/p omentopexy repair 07/12/2016 07/10/2016   Complication of anesthesia    Depression    Family history of adverse reaction to anesthesia    mother-- ponv   Family history of breast cancer    Family history of pancreatic cancer    Family history of skin cancer    Gastroenteritis 12/20/2015   GERD (gastroesophageal reflux disease)    Hardware complicating wound infection 08/03/2019   Hiatal hernia    History of cancer chemotherapy 02-20-2015 to 03-30-2015   History of cardiac murmur as a child    History of chemotherapy    History of chronic gastritis    History of hypertension    no issues since multiple abdminal sx's and chemo--- no medication since 12/ 2016   History of TMJ disorder    Hypercholesteremia    Hypertension    IBS (irritable bowel syndrome)    dx age 89   Intermittent abdominal pain    post-op multiple abdominal sx's    Microcytic anemia    Mild sleep apnea    per pt study 2014  very mild osa , no cpap recommended, recommeded wt loss and sleep routine   OA (osteoarthritis)    left knee /  left shoulder   Obesity    Parastomal hernia s/p lap repair w mesh 07/23/2018 07/23/2018   Pelvic abscess s/p drainage & omental pedicle flap 11/17/2015 06/23/2015   PONV (postoperative nausea and vomiting)    severe" needs Scopolamine PATCH"    Portacath in place    right chest   Rectal adenocarcinoma oncologis-  dr Mosetta Putt-- after radiation/ chemo (ypT1, N0) --  no recurrence per last note 03/ 2018   dx 01-13-2015-- Stage IIIC (T3, N2, M0) post concurrent radiation and chemotherapy 02-20-2015 to 03-30-2015 /  05-25-2015 s/p  LAR w/ RSO (post-op complicated by late abscess and contained anatomotic leak w/ help percutaneous drainage and antibiotics)    Rectocutaneous fistula 07/10/2016   Rotator cuff tear, left    S/P radiation therapy 02/20/15-03/30/15   colon/rectal   Vitamin D deficiency    Wears glasses    Wears glasses    Past Surgical History:   Procedure Laterality Date   ABDOMINAL HYSTERECTOMY  1996   uterus and cervix   BACK SURGERY  02/12/2018   lumbar surgery   COLON RESECTION N/A 07/12/2016   Procedure: LAPAROSCOPIC LYSIS OF ADHESIONS, OMENTOPEXY, HAND ASSISTED RESECTION OF  COLON, END TO END ANASTOMOSIS, COLOSTOMY;  Surgeon: [name], MD;  Location: [facility];  Service: General;  Laterality: N/A;   COMBINED HYSTEROSCOPY DIAGNOSTIC / D&C  x2 1990's   DIAGNOSTIC LAPAROSCOPY  age 44 and age 53   EUS N/A 01/18/2015   Procedure: LOWER ENDOSCOPIC ULTRASOUND (EUS);  Surgeon: Willis Modena, MD;  Location: [facility];  Service: Endoscopy;  Laterality: N/A;   EXCISION OF SKIN TAG  11/17/2015   Procedure: EXCISION OF SKIN TAG;  Surgeon: [name], MD;  Location: [facility];  Service: General;;   ILEO LOOP COLOSTOMY CLOSURE N/A 11/17/2015   Procedure: LAPAROSCOPIC DIVERTING LOOP ILEOSTOMY  DRAINAGE OF PELVIC ABSCESS;  Surgeon: [name], MD;  Location: [facility];  Service: General;  Laterality: N/A;   ILEOSTOMY CLOSURE N/A 05/31/2016   Procedure: TAKEDOWN LOOP ILEOSTOMY;  Surgeon: [name], MD;  Location: [facility];  Service: General;  Laterality: N/A;   IMPACTION REMOVAL  11/17/2015   Procedure: DISIMPACTION REMOVAL;  Surgeon: [name], MD;  Location: [facility];  Service: General;;   INSERTION OF MESH N/A 07/23/2018   Procedure: INSERTION OF MESH X2;  Surgeon: [name], MD;  Location: [facility];  Service: General;  Laterality: N/A;   IR LUMBAR DISC ASPIRATION W/IMG GUIDE  07/01/2019   KNEE ARTHROSCOPY Left 1990's   KNEE ARTHROSCOPY W/ MENISCECTOMY Left 09/14/2009   and chondroplasty debridement   LAPAROSCOPIC LYSIS OF ADHESIONS  11/17/2015   Procedure: LAPAROSCOPIC LYSIS OF ADHESIONS;  Surgeon: [name], MD;  Location: [facility];  Service: General;;   LUMBAR WOUND DEBRIDEMENT N/A 04/24/2018   Procedure: wound exploration, irrigation and debridement;  Surgeon: Tia Alert, MD;  Location: [facility];  Service: Neurosurgery;  Laterality:  N/A;  wound exploration, irrigation and debridement   LYSIS OF ADHESION N/A 07/23/2018   Procedure: LYSIS OF ADHESIONS;  Surgeon: [name], MD;  Location: [facility];  Service: General;  Laterality: N/A;   PORT-A-CATH REMOVAL N/A 11/21/2016   Procedure: REMOVAL PORT-A-CATH;  Surgeon: [name], MD;  Location: [facility];  Service: General;  Laterality: N/A;   PORTACATH PLACEMENT N/A 05/25/2015   Procedure: INSERTION PORT-A-CATH;  Surgeon: [name], MD;  Location: [facility];  Service: General;  Laterality: N/A;-remains inplace Right chest.   ROTATOR CUFF REPAIR Right 2006   TONSILLECTOMY  age 21   VENTRAL HERNIA REPAIR N/A 07/23/2018   Procedure: LAPAROSCOPIC VENTRAL WALL HERNIA REPAIR;  Surgeon: [name], MD;  Location: [facility];  Service: General;  Laterality: N/A;   XI ROBOTIC ASSISTED LOWER ANTERIOR RESECTION N/A 05/25/2015   Procedure: XI ROBOTIC ASSISTED LOWER ANTERIOR RESECTION, , RIGID PROCTOSCOPY, RIGHT OOPHORECTOMY;  Surgeon: [name], MD;  Location: [facility];  Service: General;  Laterality: N/A;    Social History:  reports that she quit smoking about 31 years ago. Her smoking use included cigarettes. She has a 1.75 pack-year smoking history. She has never used smokeless tobacco. She reports that she does not drink alcohol and does not use drugs.   Allergies  Allergen Reactions   Caine-1 [Lidocaine] Swelling and Rash    Eyes swell shut; includes all caine drugs except marcaine. EMLA cream OK though (?!)   Bupropion Other (See Comments)    Not effective per Pt.    Sulfa Antibiotics Nausea And Vomiting and Rash   Pantoprazole Other (See Comments)    Not effective per Pt.    Adhesive [Tape] Rash and Other (See Comments)    Blisters - can use paper tape   Doxycycline Nausea Only   Flagyl [Metronidazole] Nausea Only   Iron Nausea Only   Metoprolol Nausea Only and Palpitations   Oxycodone Other (See Comments)    NIGHTMARES. (tolerates hydrocodone or tramadol  better)   Penicillins Nausea Only and Rash    Has patient had a PCN reaction causing immediate rash, facial/tongue/throat swelling, SOB or lightheadedness with hypotension: no Has patient had a PCN reaction causing severe rash involving mucus membranes or skin necrosis: no Has patient had a PCN reaction that required hospitalization no Has patient had a PCN reaction occurring within the last 10 years: no If all of the above answers are "NO", then may proceed with Cephalosporin use.     Family History  Problem Relation Age of Onset   Coronary artery disease Mother 50   Hypertension Mother    Hyperlipidemia Mother    Diabetes Mellitus I Mother    Coronary artery disease Father    Hyperlipidemia Father    Hypertension Father    Cancer Sister        skin - non melanoma   Hyperlipidemia Brother    Cancer Maternal Uncle 25       pancreatic with mets to colon and prostate   Cirrhosis Maternal Uncle    Hypertension Maternal Grandmother    Diabetes Mellitus I Maternal Grandmother    Hyperlipidemia Maternal Grandmother    CVA Maternal Grandmother    Hypertension Maternal Grandfather    Coronary artery disease Maternal Grandfather 47   Hyperlipidemia Maternal Grandfather    Coronary artery disease Paternal Grandmother    Hypertension Paternal Grandmother    Hyperlipidemia Paternal Grandmother    Diabetes Mellitus I Paternal Grandmother    Hypertension Paternal Grandfather    Hyperlipidemia Paternal Grandfather    Coronary artery disease Paternal Grandfather      Prior to Admission medications   Medication Sig Start Date End Date Taking? Authorizing Provider  acetaminophen (TYLENOL) 500 MG tablet Take 1,000 mg by mouth every 8 (eight) hours as needed for moderate pain.    [provider]  bisoprolol (ZEBETA) 10 MG tablet Take 10 mg by mouth daily. 11/30/19   [provider]  celecoxib (CELEBREX) 100 MG capsule Take 100 mg by mouth 2 (two) times daily. 12/12/20    [provider]  Cholecalciferol (VITAMIN D3) 5000 units CAPS Take 5,000 Units by mouth at bedtime.     [provider]  Coenzyme Q10 (COQ10) 200 MG CAPS Take 200 mg by mouth at bedtime.    [provider]  DULoxetine (CYMBALTA) 60 MG capsule Take 120 mg by mouth daily.    [provider]  esomeprazole (NEXIUM) 40 MG capsule Take 1 capsule (40 mg total) by mouth 2 (two) times daily before a meal. 12/22/15   [name], MD  furosemide (LASIX) 20 MG tablet Take 20 mg by mouth daily as needed for edema.     [provider]  HYDROmorphone (DILAUDID) 4 MG tablet Take 1 tablet (4 mg total) by mouth every 6 (six) hours as needed for pain 07/10/22     levETIRAcetam (KEPPRA) 250 MG tablet Take 250 mg by mouth 2 (two) times daily.    [provider]  levothyroxine (SYNTHROID) 50 MCG tablet Take 50 mcg by mouth every morning. 02/07/22   [provider]  Melatonin 3 MG TABS Take 3-6 mg by mouth at bedtime as needed (sleep).    [provider]  Multiple Vitamin (MULTIVITAMIN WITH MINERALS) TABS tablet Take 2 tablets by mouth at bedtime.     [provider]  ondansetron (ZOFRAN) 4 MG tablet Take 1 tablet (4 mg total) by mouth every 6 (six) hours as needed for nausea. 07/20/22   Sherryll Burger, Pratik D, DO  oxybutynin (DITROPAN-XL) 10 MG 24 hr tablet Take 10 mg by mouth daily. 02/12/22   [provider]  polyethylene glycol (MIRALAX / GLYCOLAX) 17 g packet Take 17 g by mouth daily.    [provider]  pregabalin (LYRICA) 225 MG capsule Take 225 mg by mouth 2 (two) times daily. 02/07/22   [provider]  promethazine (PHENERGAN) 25 MG tablet TAKE ONE TABLET BY MOUTH EVERY 6 HOURS AS NEEDED FOR NAUSEA OR VOMITING Patient taking differently: Take 25 mg by mouth every 6 (six) hours as needed for nausea or vomiting. 07/04/20   Daiva Eves, Lisette Grinder, MD  rosuvastatin (CRESTOR) 5 MG tablet Take 5 mg by mouth at bedtime.     [provider]  triamcinolone lotion (KENALOG) 0.1 % Apply 1 application topically as needed (dry skin).    [provider]  XTAMPZA ER 9 MG C12A Take 9 mg by mouth 2 (two) times daily.    [provider]    Physical Exam: BP (!) 157/94 (BP Location: Right Arm)   Pulse (!) 103   Temp 98.6 F (37 C) (Oral)   Resp 16   Ht 5\' 11"  (1.803 m)   Wt 129.4 kg   SpO2 99%   BMI 39.79 kg/m   General: Obese female who looks uncomfortable, but not in acute distress, pleasant and cooperative, good historian. Eyes: EOMI, clear conjuctivae, white sclerea Neck: supple, no masses, trachea mildline  Cardiovascular: RRR, no murmurs or rubs, no peripheral edema  Respiratory: clear to auscultation bilaterally, no wheezes, no crackles  Abdomen: Soft, tender to palpation in the upper quadrants, no guarding or rebound.  Left mid abdomen with ostomy in place with liquid stool. Skin: dry, no rashes  Musculoskeletal: no joint effusions, normal range of motion  Psychiatric: appropriate affect, normal speech  Neurologic: extraocular muscles intact, clear speech, moving all extremities with intact sensorium          Labs on Admission:  Basic Metabolic Panel: Recent Labs  Lab 10/26/22 2226  NA 138  K 3.9  CL 100  CO2 23  GLUCOSE 173*  BUN 16  CREATININE 1.10*  CALCIUM 12.3*   Liver Function Tests: Recent Labs  Lab 10/26/22 2226  AST 15  ALT 11  ALKPHOS 74  BILITOT 0.7  PROT 9.9*  ALBUMIN 4.9   Recent Labs  Lab 10/26/22 2226  LIPASE <10*  No results for input(s): "AMMONIA" in the last 168 hours. CBC: Recent Labs  Lab 10/26/22 2226  WBC 18.2*  NEUTROABS 15.2*  HGB 15.2*  HCT 48.4*  MCV 80.3  PLT 541*   Cardiac Enzymes: No results for input(s): "CKTOTAL", "CKMB", "CKMBINDEX", "TROPONINI" in the last 168 hours.  BNP (last 3 results) No results for input(s): "BNP" in the last 8760 hours.  ProBNP (last 3 results) No results for input(s): "PROBNP" in  the last 8760 hours.  CBG: No results for input(s): "GLUCAP" in the last 168 hours.  Radiological Exams on Admission: CT ABDOMEN PELVIS W CONTRAST  Result Date: 10/27/2022 CLINICAL DATA:  Nausea/vomiting, diarrhea. History of rectal carcinoma, status post surgery, chemotherapy, and radiation. EXAM: CT ABDOMEN AND PELVIS WITH CONTRAST TECHNIQUE: Multidetector CT imaging of the abdomen and pelvis was performed using the standard protocol following bolus administration of intravenous contrast. RADIATION DOSE REDUCTION: This exam was performed according to the departmental dose-optimization program which includes automated exposure control, adjustment of the mA and/or kV according to patient size and/or use of iterative reconstruction technique. CONTRAST:  OMNIPAQUE IOHEXOL 300 MG/ML  SOLN COMPARISON:  Multiple CTs, most recently 10/04/2022. PET-CT dated 03/06/2022. FINDINGS: Lower chest: Mild subpleural scarring in the right lower lobe, favored to be post infectious inflammatory. Hepatobiliary: Liver is within normal limits. Gallbladder is unremarkable. No intrahepatic or extrahepatic dilatation. Pancreas: Within normal limits. Spleen: Within normal limits. Adrenals/Urinary Tract: Adrenal glands are within normal limits. Kidneys are notable for moderate right and mild left hydronephrosis with indwelling double pigtail ureteral stents. Thick-walled bladder, although underdistended. Stomach/Bowel: Stomach is within normal limits. Mildly dilated loops of small bowel in the left upper/mid abdomen, without abrupt transition. Suture line in the left lower pelvis (series 2/image 76). Status post left hemicolectomy with left mid abdominal colostomy. Colon leading to the colostomy is not decompressed. Overall appearance is favored to be nonobstructive. Appendix is not discretely visualized. Stable presacral soft tissue/fluid (series 2/image 88), possibly reflecting post treatment changes (given negative biopsy)  versus tumor recurrence (given hypermetabolism on PET). Associated tethering of multiple loops of bowel, the ureters (series 2/image 82), and the posterior bladder. Vascular/Lymphatic: No evidence of abdominal aortic aneurysm. Atherosclerotic calcifications of the abdominal aorta and branch vessels. Small ileocolic nodes measuring up to 11 mm short axis. Additional subcentimeter periaortic nodes. These are unchanged from the recent prior. Reproductive: Status post hysterectomy. No adnexal masses. Other: No abdominopelvic ascites. Musculoskeletal: Status post PLIF at L4-5. Degenerative changes of the lumbar spine. IMPRESSION: Status post left hemicolectomy with left mid abdominal colostomy. Mildly dilated loops of small bowel in the left upper/mid abdomen, without abrupt transition. Overall appearance is favored to be nonobstructive. Stable presacral soft tissue/fluid, possibly reflecting post treatment changes (given negative biopsy) versus tumor recurrence (given hypermetabolism on PET). Associated tethering of multiple loops of bowel, the ureters, and the posterior bladder. Moderate right and mild left hydronephrosis with indwelling double pigtail ureteral stents. Additional stable ancillary findings as above. Electronically Signed   By: Charline Bills M.D.   On: 10/27/2022 01:28    Assessment/Plan Principal Problem:   SBO (small bowel obstruction)-unfortunately this is a recurrent problem for her given her multiple abdominal surgeries.  CT with obvious transition point, but overall appearance favored to be nonobstructive. -Observation admission -Keep n.p.o. except for sips with meds -IV fluids -Noted elevated lactate, will recheck after further fluid resuscitation; patient is not septic -Pain and nausea medication as needed -Discussed with on-call surgery Dr. Margaree Mackintosh who will  consult -Will place NG tube in case of recurrent vomiting, currently she is nauseous but resolving with Zofran  Active  Problems:   Crohn's disease (HCC)   Chronic pain syndrome   GERD (gastroesophageal reflux disease)   Hyperlipidemia   Nausea and vomiting   Lactic acid acidosis-likely due to vomiting and partial/early bowel obstruction, no evidence of sepsis or acute infection.  Will recheck lactate this afternoon once she has received some further IV fluids  DVT prophylaxis: Lovenox     Code Status: Full Code  Consults called: None  Admission status: Observation   Time spent: 42 minutes  Lindsay Little Sharlette Dense MD [facility] Pager [phone]  If 7PM-7AM, please contact night-coverage [email or web address] Password [facility]  10/27/2022, 7:58 AM

## 2022-10-27 NOTE — ED Notes (Signed)
Pt provided more pain/nausea medications. Call light in reach.

## 2022-10-27 NOTE — Consult Note (Signed)
Reason for Consult:Small bowel obstruction (recurrent) Referring Physician: mir   BRYSON GRAFE is an 57 y.o. female.  HPI:  This is a 57 year old female well-known to our surgical service having undergone multiple procedures since 2016 by Dr. Estelle Grumbles.  Patient underwent abdominal resection of rectal carcinoma in 2016.  She had postoperative complications including an anastomotic leak.  She now has a left-sided colostomy.  Patient has had previous small bowel obstructions.  She was most recently admitted for small bowel obstruction in January 2024.  This resolved without placement of a nasogastric tube and without surgical intervention.  Patient developed symptoms over the past 24 hours of abdominal pain, nausea, and emesis.  She continues to have colostomy output.  She presented to the emergency department for evaluation.  Laboratory studies were notable for a mildly elevated white blood cell count of 18 and a lactate level of 2.1.  CT scan of the abdomen and pelvis demonstrated dilated loops of small bowel without abrupt transition.  There is also been interval placement of ureteral stents.  General surgery is now asked to evaluate.  Past Medical History:  Diagnosis Date   Anxiety    Chemotherapy-induced neuropathy    toes and fingers numbness and tingling   Colorectal delayed anastomotic leak s/p resection & colostomy 07/12/2016 11/17/2015   Colostomy in place 07/12/2016   due to anastomosis breakdown w/ colovaginal fistula   Colovaginal fistula s/p omentopexy repair 07/12/2016 07/10/2016   Complication of anesthesia    Depression    Family history of adverse reaction to anesthesia    mother-- ponv   Family history of breast cancer    Family history of pancreatic cancer    Family history of skin cancer    Gastroenteritis 12/20/2015   GERD (gastroesophageal reflux disease)    Hardware complicating wound infection 08/03/2019   Hiatal hernia    History of cancer chemotherapy 02-20-2015  to 03-30-2015   History of cardiac murmur as a child    History of chemotherapy    History of chronic gastritis    History of hypertension    no issues since multiple abdminal sx's and chemo--- no medication since 12/ 2016   History of TMJ disorder    Hypercholesteremia    Hypertension    IBS (irritable bowel syndrome)    dx age 32   Intermittent abdominal pain    post-op multiple abdominal sx's    Microcytic anemia    Mild sleep apnea    per pt study 2014  very mild osa , no cpap recommended, recommeded wt loss and sleep routine   OA (osteoarthritis)    left knee /  left shoulder   Obesity    Parastomal hernia s/p lap repair w mesh 07/23/2018 07/23/2018   Pelvic abscess s/p drainage & omental pedicle flap 11/17/2015 06/23/2015   PONV (postoperative nausea and vomiting)    severe" needs Scopolamine PATCH"    Portacath in place    right chest   Rectal adenocarcinoma oncologis-  dr Mosetta Putt-- after radiation/ chemo (ypT1, N0) --  no recurrence per last note 03/ 2018   dx 01-13-2015-- Stage IIIC (T3, N2, M0) post concurrent radiation and chemotherapy 02-20-2015 to 03-30-2015 /  05-25-2015 s/p  LAR w/ RSO (post-op complicated by late abscess and contained anatomotic leak w/ help percutaneous drainage and antibiotics)    Rectocutaneous fistula 07/10/2016   Rotator cuff tear, left    S/P radiation therapy 02/20/15-03/30/15   colon/rectal   Vitamin D deficiency  Wears glasses    Wears glasses     Past Surgical History:  Procedure Laterality Date   ABDOMINAL HYSTERECTOMY  1996   uterus and cervix   BACK SURGERY  02/12/2018   lumbar surgery   COLON RESECTION N/A 07/12/2016   Procedure: LAPAROSCOPIC LYSIS OF ADHESIONS, OMENTOPEXY, HAND ASSISTED RESECTION OF  COLON, END TO END ANASTOMOSIS, COLOSTOMY;  Surgeon: [name], MD;  Location: [facility];  Service: General;  Laterality: N/A;   COMBINED HYSTEROSCOPY DIAGNOSTIC / D&C  x2 1990's   DIAGNOSTIC LAPAROSCOPY  age 78 and age 63   EUS N/A  01/18/2015   Procedure: LOWER ENDOSCOPIC ULTRASOUND (EUS);  Surgeon: Willis Modena, MD;  Location: [facility];  Service: Endoscopy;  Laterality: N/A;   EXCISION OF SKIN TAG  11/17/2015   Procedure: EXCISION OF SKIN TAG;  Surgeon: [name], MD;  Location: [facility];  Service: General;;   ILEO LOOP COLOSTOMY CLOSURE N/A 11/17/2015   Procedure: LAPAROSCOPIC DIVERTING LOOP ILEOSTOMY  DRAINAGE OF PELVIC ABSCESS;  Surgeon: [name], MD;  Location: [facility];  Service: General;  Laterality: N/A;   ILEOSTOMY CLOSURE N/A 05/31/2016   Procedure: TAKEDOWN LOOP ILEOSTOMY;  Surgeon: [name], MD;  Location: [facility];  Service: General;  Laterality: N/A;   IMPACTION REMOVAL  11/17/2015   Procedure: DISIMPACTION REMOVAL;  Surgeon: [name], MD;  Location: [facility];  Service: General;;   INSERTION OF MESH N/A 07/23/2018   Procedure: INSERTION OF MESH X2;  Surgeon: [name], MD;  Location: [facility];  Service: General;  Laterality: N/A;   IR LUMBAR DISC ASPIRATION W/IMG GUIDE  07/01/2019   KNEE ARTHROSCOPY Left 1990's   KNEE ARTHROSCOPY W/ MENISCECTOMY Left 09/14/2009   and chondroplasty debridement   LAPAROSCOPIC LYSIS OF ADHESIONS  11/17/2015   Procedure: LAPAROSCOPIC LYSIS OF ADHESIONS;  Surgeon: [name], MD;  Location: [facility];  Service: General;;   LUMBAR WOUND DEBRIDEMENT N/A 04/24/2018   Procedure: wound exploration, irrigation and debridement;  Surgeon: Tia Alert, MD;  Location: [facility];  Service: Neurosurgery;  Laterality: N/A;  wound exploration, irrigation and debridement   LYSIS OF ADHESION N/A 07/23/2018   Procedure: LYSIS OF ADHESIONS;  Surgeon: [name], MD;  Location: [facility];  Service: General;  Laterality: N/A;   PORT-A-CATH REMOVAL N/A 11/21/2016   Procedure: REMOVAL PORT-A-CATH;  Surgeon: [name], MD;  Location: [facility];  Service: General;  Laterality: N/A;   PORTACATH PLACEMENT N/A 05/25/2015   Procedure: INSERTION PORT-A-CATH;  Surgeon: [name], MD;   Location: [facility];  Service: General;  Laterality: N/A;-remains inplace Right chest.   ROTATOR CUFF REPAIR Right 2006   TONSILLECTOMY  age 20   VENTRAL HERNIA REPAIR N/A 07/23/2018   Procedure: LAPAROSCOPIC VENTRAL WALL HERNIA REPAIR;  Surgeon: [name], MD;  Location: [facility];  Service: General;  Laterality: N/A;   XI ROBOTIC ASSISTED LOWER ANTERIOR RESECTION N/A 05/25/2015   Procedure: XI ROBOTIC ASSISTED LOWER ANTERIOR RESECTION, , RIGID PROCTOSCOPY, RIGHT OOPHORECTOMY;  Surgeon: [name], MD;  Location: [facility];  Service: General;  Laterality: N/A;    Family History  Problem Relation Age of Onset   Coronary artery disease Mother 55   Hypertension Mother    Hyperlipidemia Mother    Diabetes Mellitus I Mother    Coronary artery disease Father    Hyperlipidemia Father    Hypertension Father    Cancer Sister        skin - non melanoma   Hyperlipidemia Brother  Cancer Maternal Uncle 72       pancreatic with mets to colon and prostate   Cirrhosis Maternal Uncle    Hypertension Maternal Grandmother    Diabetes Mellitus I Maternal Grandmother    Hyperlipidemia Maternal Grandmother    CVA Maternal Grandmother    Hypertension Maternal Grandfather    Coronary artery disease Maternal Grandfather 9   Hyperlipidemia Maternal Grandfather    Coronary artery disease Paternal Grandmother    Hypertension Paternal Grandmother    Hyperlipidemia Paternal Grandmother    Diabetes Mellitus I Paternal Grandmother    Hypertension Paternal Grandfather    Hyperlipidemia Paternal Grandfather    Coronary artery disease Paternal Grandfather     Social History:  reports that she quit smoking about 31 years ago. Her smoking use included cigarettes. She has a 1.75 pack-year smoking history. She has never used smokeless tobacco. She reports that she does not drink alcohol and does not use drugs.  Allergies:  Allergies  Allergen Reactions   Caine-1 [Lidocaine] Swelling and Rash    Eyes swell shut;  includes all caine drugs except marcaine. EMLA cream OK though (?!)   Bupropion Other (See Comments)    Not effective per Pt.    Sulfa Antibiotics Nausea And Vomiting and Rash   Pantoprazole Other (See Comments)    Not effective per Pt.    Adhesive [Tape] Rash and Other (See Comments)    Blisters - can use paper tape   Doxycycline Nausea Only   Flagyl [Metronidazole] Nausea Only   Iron Nausea Only   Metoprolol Nausea Only and Palpitations   Oxycodone Other (See Comments)    NIGHTMARES. (tolerates hydrocodone or tramadol better)   Penicillins Nausea Only and Rash    Has patient had a PCN reaction causing immediate rash, facial/tongue/throat swelling, SOB or lightheadedness with hypotension: no Has patient had a PCN reaction causing severe rash involving mucus membranes or skin necrosis: no Has patient had a PCN reaction that required hospitalization no Has patient had a PCN reaction occurring within the last 10 years: no If all of the above answers are "NO", then may proceed with Cephalosporin use.     Medications:  Prior to Admission medications   Medication Sig Start Date End Date Taking? Authorizing Provider  acetaminophen (TYLENOL) 500 MG tablet Take 1,000 mg by mouth every 8 (eight) hours as needed for moderate pain.   Yes [provider]  BELBUCA 450 MCG FILM Place 1 Film inside cheek every 12 (twelve) hours. 10/24/22 11/23/22 Yes [provider]  celecoxib (CELEBREX) 100 MG capsule Take 100 mg by mouth 2 (two) times daily. 12/12/20  Yes [provider]  Cholecalciferol (VITAMIN D3) 5000 units CAPS Take 5,000 Units by mouth at bedtime.    Yes [provider]  Coenzyme Q10 (COQ10) 200 MG CAPS Take 200 mg by mouth at bedtime.   Yes [provider]  DULoxetine (CYMBALTA) 60 MG capsule Take 120 mg by mouth daily.   Yes [provider]  esomeprazole (NEXIUM) 40 MG capsule Take 1 capsule (40 mg total) by mouth 2 (two) times daily  before a meal. 12/22/15  Yes Gross, Viviann Spare, MD  famotidine (PEPCID) 20 MG tablet Take 20 mg by mouth 2 (two) times daily. 08/28/22 02/24/23 Yes [provider]  furosemide (LASIX) 20 MG tablet Take 20 mg by mouth daily as needed for edema.    Yes [provider]  HYDROmorphone (DILAUDID) 4 MG tablet Take 1 tablet (4 mg total) by  mouth every 6 (six) hours as needed for pain 07/10/22  Yes   levETIRAcetam (KEPPRA) 250 MG tablet Take 500 mg by mouth 2 (two) times daily.   Yes [provider]  levothyroxine (SYNTHROID) 50 MCG tablet Take 50 mcg by mouth every morning. 02/07/22  Yes [provider]  Melatonin 3 MG TABS Take 3-6 mg by mouth at bedtime as needed (sleep).   Yes [provider]  Multiple Vitamin (MULTIVITAMIN WITH MINERALS) TABS tablet Take 2 tablets by mouth at bedtime.    Yes [provider]  ondansetron (ZOFRAN) 4 MG tablet Take 1 tablet (4 mg total) by mouth every 6 (six) hours as needed for nausea. 07/20/22  Yes Shah, Pratik D, DO  oxybutynin (DITROPAN XL) 15 MG 24 hr tablet Take 15 mg by mouth at bedtime.   Yes [provider]  polyethylene glycol (MIRALAX / GLYCOLAX) 17 g packet Take 17 g by mouth daily.   Yes [provider]  pregabalin (LYRICA) 225 MG capsule Take 225 mg by mouth 2 (two) times daily. 02/07/22  Yes [provider]  promethazine (PHENERGAN) 25 MG tablet TAKE ONE TABLET BY MOUTH EVERY 6 HOURS AS NEEDED FOR NAUSEA OR VOMITING Patient taking differently: Take 25 mg by mouth every 6 (six) hours as needed for nausea or vomiting. 07/04/20  Yes Daiva Eves, Lisette Grinder, MD  rosuvastatin (CRESTOR) 5 MG tablet Take 5 mg by mouth at bedtime.   Yes [provider]  triamcinolone lotion (KENALOG) 0.1 % Apply 1 application topically as needed (dry skin).   Yes [provider]     Results for orders placed or performed during the hospital encounter of 10/26/22 (from the past 48 hour(s))   Comprehensive metabolic panel     Status: Abnormal   Collection Time: 10/26/22 10:26 PM  Result Value Ref Range   Sodium 138 135 - 145 mmol/L   Potassium 3.9 3.5 - 5.1 mmol/L   Chloride 100 98 - 111 mmol/L   CO2 23 22 - 32 mmol/L   Glucose, Bld 173 (H) 70 - 99 mg/dL    Comment: Glucose reference range applies only to samples taken after fasting for at least 8 hours.   BUN 16 6 - 20 mg/dL   Creatinine, Ser 1.61 (H) 0.44 - 1.00 mg/dL   Calcium 09.6 (H) 8.9 - 10.3 mg/dL   Total Protein 9.9 (H) 6.5 - 8.1 g/dL   Albumin 4.9 3.5 - 5.0 g/dL   AST 15 15 - 41 U/L   ALT 11 0 - 44 U/L   Alkaline Phosphatase 74 38 - 126 U/L   Total Bilirubin 0.7 0.3 - 1.2 mg/dL   GFR, Estimated 59 (L) >60 mL/min    Comment: (NOTE) Calculated using the CKD-EPI Creatinine Equation (2021)    Anion gap 15 5 - 15    Comment: Performed at [facility], [address] [place], [place] [address]  Lipase, blood     Status: Abnormal   Collection Time: 10/26/22 10:26 PM  Result Value Ref Range   Lipase <10 (L) 11 - 51 U/L    Comment: Performed at [facility], [address], [place], [place] [address]  CBC with Differential     Status: Abnormal   Collection Time: 10/26/22 10:26 PM  Result Value Ref Range   WBC 18.2 (H) 4.0 - 10.5 K/uL   RBC 6.03 (H) 3.87 - 5.11 MIL/uL   Hemoglobin 15.2 (H) 12.0 - 15.0 g/dL  HCT 48.4 (H) 36.0 - 46.0 %   MCV 80.3 80.0 - 100.0 fL   MCH 25.2 (L) 26.0 - 34.0 pg   MCHC 31.4 30.0 - 36.0 g/dL   RDW 16.1 (H) 09.6 - 04.5 %   Platelets 541 (H) 150 - 400 K/uL   nRBC 0.0 0.0 - 0.2 %   Neutrophils Relative % 84 %   Neutro Abs 15.2 (H) 1.7 - 7.7 K/uL   Lymphocytes Relative 11 %   Lymphs Abs 2.0 0.7 - 4.0 K/uL   Monocytes Relative 4 %   Monocytes Absolute 0.8 0.1 - 1.0 K/uL   Eosinophils Relative 0 %   Eosinophils Absolute 0.0 0.0 - 0.5 K/uL   Basophils Relative 0 %   Basophils Absolute 0.1 0.0 - 0.1 K/uL   Immature Granulocytes 1 %   Abs  Immature Granulocytes 0.10 (H) 0.00 - 0.07 K/uL    Comment: Performed at [facility], [address] [place], [place] [address]  Lactic acid, plasma     Status: Abnormal   Collection Time: 10/26/22 10:34 PM  Result Value Ref Range   Lactic Acid, Venous 2.1 (HH) 0.5 - 1.9 mmol/L    Comment: CRITICAL RESULT CALLED TO, READ BACK BY AND VERIFIED WITH: [facility] MAYNARD 10/26/22 AT 2307 HS Performed at [facility], [address], [place], [place] [address]   Lactic acid, plasma     Status: Abnormal   Collection Time: 10/27/22 12:56 AM  Result Value Ref Range   Lactic Acid, Venous 2.2 (HH) 0.5 - 1.9 mmol/L    Comment: CRITICAL VALUE NOTED.  VALUE IS CONSISTENT WITH PREVIOUSLY REPORTED AND CALLED VALUE. Performed at [facility], [address], [place], [place] [address]   CBC     Status: Abnormal   Collection Time: 10/27/22  8:27 AM  Result Value Ref Range   WBC 15.4 (H) 4.0 - 10.5 K/uL   RBC 5.52 (H) 3.87 - 5.11 MIL/uL   Hemoglobin 13.9 12.0 - 15.0 g/dL   HCT 21.3 08.6 - 57.8 %   MCV 82.8 80.0 - 100.0 fL   MCH 25.2 (L) 26.0 - 34.0 pg   MCHC 30.4 30.0 - 36.0 g/dL   RDW 46.9 (H) 62.9 - 52.8 %   Platelets 356 150 - 400 K/uL   nRBC 0.0 0.0 - 0.2 %    Comment: Performed at [facility], [address], [place], [place] [address]  Creatinine, serum     Status: None   Collection Time: 10/27/22  8:27 AM  Result Value Ref Range   Creatinine, Ser 0.97 0.44 - 1.00 mg/dL   GFR, Estimated >40 >10 mL/min    Comment: (NOTE) Calculated using the CKD-EPI Creatinine Equation (2021) Performed at [facility], [address], [place], [place] [address]     CT ABDOMEN PELVIS W CONTRAST  Result Date: 10/27/2022 CLINICAL DATA:  Nausea/vomiting, diarrhea. History of rectal carcinoma, status post surgery, chemotherapy, and radiation. EXAM: CT ABDOMEN AND PELVIS WITH CONTRAST TECHNIQUE: Multidetector CT  imaging of the abdomen and pelvis was performed using the standard protocol following bolus administration of intravenous contrast. RADIATION DOSE REDUCTION: This exam was performed according to the departmental dose-optimization program which includes automated exposure control, adjustment of the mA and/or kV according to patient size and/or use of iterative reconstruction technique. CONTRAST:  OMNIPAQUE IOHEXOL 300 MG/ML  SOLN COMPARISON:  Multiple CTs, most recently 10/04/2022. PET-CT dated 03/06/2022. FINDINGS: Lower chest: Mild subpleural scarring in  the right lower lobe, favored to be post infectious inflammatory. Hepatobiliary: Liver is within normal limits. Gallbladder is unremarkable. No intrahepatic or extrahepatic dilatation. Pancreas: Within normal limits. Spleen: Within normal limits. Adrenals/Urinary Tract: Adrenal glands are within normal limits. Kidneys are notable for moderate right and mild left hydronephrosis with indwelling double pigtail ureteral stents. Thick-walled bladder, although underdistended. Stomach/Bowel: Stomach is within normal limits. Mildly dilated loops of small bowel in the left upper/mid abdomen, without abrupt transition. Suture line in the left lower pelvis (series 2/image 76). Status post left hemicolectomy with left mid abdominal colostomy. Colon leading to the colostomy is not decompressed. Overall appearance is favored to be nonobstructive. Appendix is not discretely visualized. Stable presacral soft tissue/fluid (series 2/image 88), possibly reflecting post treatment changes (given negative biopsy) versus tumor recurrence (given hypermetabolism on PET). Associated tethering of multiple loops of bowel, the ureters (series 2/image 82), and the posterior bladder. Vascular/Lymphatic: No evidence of abdominal aortic aneurysm. Atherosclerotic calcifications of the abdominal aorta and branch vessels. Small ileocolic nodes measuring up to 11 mm short axis. Additional  subcentimeter periaortic nodes. These are unchanged from the recent prior. Reproductive: Status post hysterectomy. No adnexal masses. Other: No abdominopelvic ascites. Musculoskeletal: Status post PLIF at L4-5. Degenerative changes of the lumbar spine. IMPRESSION: Status post left hemicolectomy with left mid abdominal colostomy. Mildly dilated loops of small bowel in the left upper/mid abdomen, without abrupt transition. Overall appearance is favored to be nonobstructive. Stable presacral soft tissue/fluid, possibly reflecting post treatment changes (given negative biopsy) versus tumor recurrence (given hypermetabolism on PET). Associated tethering of multiple loops of bowel, the ureters, and the posterior bladder. Moderate right and mild left hydronephrosis with indwelling double pigtail ureteral stents. Additional stable ancillary findings as above. Electronically Signed   By: Charline Bills M.D.   On: 10/27/2022 01:28    Review of Systems  HENT:  Negative for ear discharge, ear pain, hearing loss and tinnitus.   Eyes:  Negative for photophobia and pain.  Respiratory:  Negative for cough and shortness of breath.   Cardiovascular:  Negative for chest pain.  Gastrointestinal:  Positive for abdominal pain, nausea and vomiting.  Genitourinary:  Negative for dysuria, flank pain, frequency and urgency.  Musculoskeletal:  Negative for back pain, myalgias and neck pain.  Neurological:  Negative for dizziness and headaches.  Hematological:  Does not bruise/bleed easily.  Psychiatric/Behavioral:  The patient is not nervous/anxious.    Blood pressure (!) 158/91, pulse (!) 108, temperature 98.3 F (36.8 C), temperature source Oral, resp. rate 16, height 5\' 11"  (1.803 m), weight 129.4 kg, SpO2 96 %. Physical Exam Constitutional:  WDWN in NAD, conversant, no obvious deformities;  Eyes:  Pupils equal, round; sclera anicteric; moist conjunctiva; no lid lag HENT:  Oral mucosa moist; good dentition  Neck:  No  masses palpated, trachea midline; no thyromegaly Lungs:  CTA bilaterally; normal respiratory effort CV:  Regular rate and rhythm; no murmurs; extremities well-perfused with no edema Abd:  +bowel sounds, soft, bilateral upper quadrant tenderness LUQ colostomy with stool output Musc:  Unable to assess gait; no apparent clubbing or cyanosis in extremities Lymphatic:  No palpable cervical or axillary lymphadenopathy Skin:  Warm, dry; no sign of jaundice Psychiatric - alert and oriented x 4; calm mood and affect  Assessment/Plan: Nausea/ vomiting/ distention - without sign of definite small bowel obstruction; favor ileus Hold off NG tube unless the patient has continued vomiting Bowel rest IV hydration Consider adding Reglan to her medications to improve motility  No indications for acute surgery  Lindsay Little 10/27/2022, 10:05 AM

## 2022-10-27 NOTE — ED Notes (Signed)
Pt provided pain meds and fluids. Pt resting and pain is better controled. Reiterated POC; pt had no further needs at this time, call light in reach.

## 2022-10-28 DIAGNOSIS — Z8719 Personal history of other diseases of the digestive system: Secondary | ICD-10-CM | POA: Diagnosis not present

## 2022-10-28 DIAGNOSIS — R112 Nausea with vomiting, unspecified: Secondary | ICD-10-CM

## 2022-10-28 LAB — CBC
HCT: 38.6 % (ref 36.0–46.0)
Hemoglobin: 11.4 g/dL — ABNORMAL LOW (ref 12.0–15.0)
MCH: 25.4 pg — ABNORMAL LOW (ref 26.0–34.0)
MCHC: 29.5 g/dL — ABNORMAL LOW (ref 30.0–36.0)
MCV: 86 fL (ref 80.0–100.0)
Platelets: 306 10*3/uL (ref 150–400)
RBC: 4.49 MIL/uL (ref 3.87–5.11)
RDW: 15.7 % — ABNORMAL HIGH (ref 11.5–15.5)
WBC: 9 10*3/uL (ref 4.0–10.5)
nRBC: 0 % (ref 0.0–0.2)

## 2022-10-28 LAB — BASIC METABOLIC PANEL
Anion gap: 8 (ref 5–15)
BUN: 15 mg/dL (ref 6–20)
CO2: 23 mmol/L (ref 22–32)
Calcium: 8.5 mg/dL — ABNORMAL LOW (ref 8.9–10.3)
Chloride: 106 mmol/L (ref 98–111)
Creatinine, Ser: 0.8 mg/dL (ref 0.44–1.00)
GFR, Estimated: >60 mL/min (ref >60)
Glucose, Bld: 97 mg/dL (ref 70–99)
Potassium: 3.9 mmol/L (ref 3.5–5.1)
Sodium: 137 mmol/L (ref 135–145)

## 2022-10-28 NOTE — Progress Notes (Signed)
Progress Note    Lindsay Little   HYW:[id number]  DOB: Nov 20, 1965  DOA: 10/26/2022     1 PCP: Laurann Montana, MD  Initial CC: N/V, abd pain  Hospital Course: Lindsay Little is a 57 yo female with PMH hypertension, HLD, rectal cancer with history of resection/colostomy creation, multiple abdominal surgeries due to intra-abdominal abscesses and recurrent SBO.  She presented with nausea/vomiting and watery output from ostomy.  Symptoms began approximately Saturday morning and she presented later Saturday evening for evaluation. CT abdomen/pelvis showed mildly dilated loops of small bowel in the left upper/mid abdomen without abrupt transition.  Findings were concerning for early obstruction versus ileus.  She was admitted for general surgery consultation and further workup.  Interval History:  Seen this morning resting in bed, she was already feeling a little better.  She was amenable with starting clear liquids and had improvement of her abdominal pain as well as nausea/vomiting.  Assessment and Plan: * History of small bowel obstruction - Dilated bowel loops noted on CT abdomen/pelvis.  No clear transition point.  Findings concerning for possible early obstruction versus ileus. -Patient started on bowel rest initially and fluids - She has been started on trial of clear liquids today per surgery -Continue pain and nausea control as needed  Nausea and vomiting - Continue antiemetics as needed - See workup above  Lactic acid acidosis - Suspected due to hypovolemia from GI losses rather than underlying infection - Continue supportive care  Hyperlipidemia - on crestor at home   GERD (gastroesophageal reflux disease) - continue PPI  Chronic pain syndrome - Database reviewed.  On Belbuca, MS Contin, and Dilaudid; sometimes lyrica   Old records reviewed in assessment of this patient  Antimicrobials:   DVT prophylaxis:  SCDs Start: 10/27/22 0757   Code Status:   Code  Status: Full Code  Mobility Assessment (last 72 hours)     Mobility Assessment     Row Name 10/28/22 0900 10/27/22 2100 10/27/22 0730 10/27/22 0600     Does patient have an order for bedrest or is patient medically unstable No - Continue assessment No - Continue assessment No - Continue assessment No - Continue assessment    What is the highest level of mobility based on the progressive mobility assessment? Level 5 (Walks with assist in room/hall) - Balance while stepping forward/back and can walk in room with assist - Complete Level 5 (Walks with assist in room/hall) - Balance while stepping forward/back and can walk in room with assist - Complete Level 5 (Walks with assist in room/hall) - Balance while stepping forward/back and can walk in room with assist - Complete Level 5 (Walks with assist in room/hall) - Balance while stepping forward/back and can walk in room with assist - Complete             Barriers to discharge: none Disposition Plan:  Home 2-3 days Status is: Inpt  Objective: Blood pressure 124/74, pulse 76, temperature (!) 97.4 F (36.3 C), temperature source Oral, resp. rate 14, height 5\' 11"  (1.803 m), weight 129.4 kg, SpO2 93 %.  Examination:  Physical Exam Constitutional:      Appearance: Normal appearance.  HENT:     Head: Normocephalic and atraumatic.     Mouth/Throat:     Mouth: Mucous membranes are moist.  Eyes:     Extraocular Movements: Extraocular movements intact.  Cardiovascular:     Rate and Rhythm: Normal rate and regular rhythm.  Pulmonary:     Effort:  Pulmonary effort is normal. No respiratory distress.     Breath sounds: Normal breath sounds. No wheezing.  Abdominal:     General: Bowel sounds are decreased.     Palpations: Abdomen is soft.     Tenderness: There is generalized abdominal tenderness.     Comments: Ostomy bag in place  Musculoskeletal:        General: No swelling. Normal range of motion.     Cervical back: Normal range of  motion and neck supple.  Skin:    General: Skin is dry.  Neurological:     General: No focal deficit present.     Mental Status: She is alert and oriented to person, place, and time.  Psychiatric:        Mood and Affect: Mood normal.        Behavior: Behavior normal.      Consultants:  General surgery  Procedures:    Data Reviewed: Results for orders placed or performed during the hospital encounter of 10/26/22 (from the past 24 hour(s))  Lactic acid, plasma     Status: None   Collection Time: 10/27/22  3:48 PM  Result Value Ref Range   Lactic Acid, Venous 1.2 0.5 - 1.9 mmol/L  Basic metabolic panel     Status: Abnormal   Collection Time: 10/28/22  5:59 AM  Result Value Ref Range   Sodium 137 135 - 145 mmol/L   Potassium 3.9 3.5 - 5.1 mmol/L   Chloride 106 98 - 111 mmol/L   CO2 23 22 - 32 mmol/L   Glucose, Bld 97 70 - 99 mg/dL   BUN 15 6 - 20 mg/dL   Creatinine, Ser 4.09 0.44 - 1.00 mg/dL   Calcium 8.5 (L) 8.9 - 10.3 mg/dL   GFR, Estimated >81 >19 mL/min   Anion gap 8 5 - 15  CBC     Status: Abnormal   Collection Time: 10/28/22  5:59 AM  Result Value Ref Range   WBC 9.0 4.0 - 10.5 K/uL   RBC 4.49 3.87 - 5.11 MIL/uL   Hemoglobin 11.4 (L) 12.0 - 15.0 g/dL   HCT 14.7 82.9 - 56.2 %   MCV 86.0 80.0 - 100.0 fL   MCH 25.4 (L) 26.0 - 34.0 pg   MCHC 29.5 (L) 30.0 - 36.0 g/dL   RDW 13.0 (H) 86.5 - 78.4 %   Platelets 306 150 - 400 K/uL   nRBC 0.0 0.0 - 0.2 %    I have reviewed pertinent nursing notes, vitals, labs, and images as necessary. I have ordered labwork to follow up on as indicated.  I have reviewed the last notes from staff over past 24 hours. I have discussed patient's care plan and test results with nursing staff, CM/SW, and other staff as appropriate.  Time spent: Greater than 50% of the 55 minute visit was spent in counseling/coordination of care for the patient as laid out in the A&P.   LOS: 1 day   Lewie Chamber, MD [facility] 10/28/2022,  12:53 PM

## 2022-10-28 NOTE — Progress Notes (Signed)
Mobility Specialist - Progress Note   10/28/22 0957  Mobility  Activity Ambulated with assistance in hallway  Level of Assistance Modified independent, requires aide device or extra time  Assistive Device None  Distance Ambulated (ft) 500 ft  Activity Response Tolerated well  Mobility Referral Yes  $Mobility charge 1 Mobility   Pt received in bed and agreed to mobility, had no issues throughout session.  Nurse requested Mobility Specialist to perform oxygen saturation test with pt which includes removing pt from oxygen both at rest and while ambulating.  Below are the results from that testing.     Patient Saturations on Room Air at Rest = spO2 96%  At end of testing pt left on Room Air.  Reported results to nurse.   Pt returned to bed with all needs met.   Marilynne Halsted Mobility Specialist

## 2022-10-28 NOTE — Assessment & Plan Note (Signed)
-   Continue antiemetics as needed - See workup above

## 2022-10-28 NOTE — Assessment & Plan Note (Signed)
-   Dilated bowel loops noted on CT abdomen/pelvis.  No clear transition point.  Findings concerning for possible early obstruction versus ileus. -Patient started on bowel rest initially and fluids -Tolerated clears after admission and advanced to full liquid diet this morning per general surgery - Continue monitoring for tolerance

## 2022-10-28 NOTE — Assessment & Plan Note (Signed)
-   on crestor at home

## 2022-10-28 NOTE — Progress Notes (Signed)
Subjective: CC: Patient reports on Thursday/Friday she had decreased output from her ostomy but no abdominal pain.  On Saturday she began having upper abdominal pain, nausea and vomiting along with liquid output from her ostomy.  Today her upper abdominal pain has completely resolved.  Nausea has improved.  No further vomiting since Saturday.  She has not needed any antiemetics this morning.  She continues to have liquid output from her ostomy.  She does have chronic lower abdominal/back pain that she takes p.o. Dilaudid and morphine x 4 at home and feels like is at baseline.   To note patient had presacral soft tissue/fluid noted on CT.  Per radiology this appears stable.  She has previously undergone biopsy of this in September 2023 that was negative for malignancy.  She is followed by Dr. Mosetta Putt.   Afebrile.  Tachycardia resolved.  No hypotension. Lactic cleared (1.2). WBC normalized.   Objective: Vital signs in last 24 hours: Temp:  [97.4 F (36.3 C)-98.3 F (36.8 C)] 97.4 F (36.3 C) (04/15 0458) Pulse Rate:  [76-108] 76 (04/15 0458) Resp:  [14-18] 14 (04/15 0458) BP: (124-163)/(74-97) 124/74 (04/15 0458) SpO2:  [96 %-100 %] 100 % (04/15 0458) Last BM Date : 10/27/22  Intake/Output from previous day: 04/14 0701 - 04/15 0700 In: 240 [P.O.:240] Out: -  Intake/Output this shift: No intake/output data recorded.  PE: Gen:  Alert, NAD, pleasant Abd: Soft, no obvious distension, NT +BS. Stoma budded and viable. Liquid stool in ostomy bag.   Lab Results:  Recent Labs    10/27/22 0827 10/28/22 0559  WBC 15.4* 9.0  HGB 13.9 11.4*  HCT 45.7 38.6  PLT 356 306   BMET Recent Labs    10/26/22 2226 10/27/22 0827 10/28/22 0559  NA 138  --  137  K 3.9  --  3.9  CL 100  --  106  CO2 23  --  23  GLUCOSE 173*  --  97  BUN 16  --  15  CREATININE 1.10* 0.97 0.80  CALCIUM 12.3*  --  8.5*   PT/INR No results for input(s): "LABPROT", "INR" in the last 72 hours. CMP      Component Value Date/Time   NA 137 10/28/2022 0559   NA 140 05/30/2017 1009   K 3.9 10/28/2022 0559   K 4.3 05/30/2017 1009   CL 106 10/28/2022 0559   CO2 23 10/28/2022 0559   CO2 25 05/30/2017 1009   GLUCOSE 97 10/28/2022 0559   GLUCOSE 97 05/30/2017 1009   BUN 15 10/28/2022 0559   BUN 17.7 05/30/2017 1009   CREATININE 0.80 10/28/2022 0559   CREATININE 0.71 07/01/2019 1339   CREATININE 0.7 05/30/2017 1009   CALCIUM 8.5 (L) 10/28/2022 0559   CALCIUM 9.7 05/30/2017 1009   PROT 9.9 (H) 10/26/2022 2226   PROT 7.2 05/30/2017 1009   ALBUMIN 4.9 10/26/2022 2226   ALBUMIN 3.8 05/30/2017 1009   AST 15 10/26/2022 2226   AST 14 (L) 07/01/2019 1339   AST 16 05/30/2017 1009   ALT 11 10/26/2022 2226   ALT 12 07/01/2019 1339   ALT 22 05/30/2017 1009   ALKPHOS 74 10/26/2022 2226   ALKPHOS 89 05/30/2017 1009   BILITOT 0.7 10/26/2022 2226   BILITOT 0.3 07/01/2019 1339   BILITOT 0.52 05/30/2017 1009   GFRNONAA >60 10/28/2022 0559   GFRNONAA >60 07/01/2019 1339   GFRAA >60 08/25/2019 1326   GFRAA >60 07/01/2019 1339   Lipase  Component Value Date/Time   LIPASE <10 (L) 10/26/2022 2226    Studies/Results: CT ABDOMEN PELVIS W CONTRAST  Result Date: 10/27/2022 CLINICAL DATA:  Nausea/vomiting, diarrhea. History of rectal carcinoma, status post surgery, chemotherapy, and radiation. EXAM: CT ABDOMEN AND PELVIS WITH CONTRAST TECHNIQUE: Multidetector CT imaging of the abdomen and pelvis was performed using the standard protocol following bolus administration of intravenous contrast. RADIATION DOSE REDUCTION: This exam was performed according to the departmental dose-optimization program which includes automated exposure control, adjustment of the mA and/or kV according to patient size and/or use of iterative reconstruction technique. CONTRAST:  OMNIPAQUE IOHEXOL 300 MG/ML  SOLN COMPARISON:  Multiple CTs, most recently 10/04/2022. PET-CT dated 03/06/2022. FINDINGS: Lower chest: Mild  subpleural scarring in the right lower lobe, favored to be post infectious inflammatory. Hepatobiliary: Liver is within normal limits. Gallbladder is unremarkable. No intrahepatic or extrahepatic dilatation. Pancreas: Within normal limits. Spleen: Within normal limits. Adrenals/Urinary Tract: Adrenal glands are within normal limits. Kidneys are notable for moderate right and mild left hydronephrosis with indwelling double pigtail ureteral stents. Thick-walled bladder, although underdistended. Stomach/Bowel: Stomach is within normal limits. Mildly dilated loops of small bowel in the left upper/mid abdomen, without abrupt transition. Suture line in the left lower pelvis (series 2/image 76). Status post left hemicolectomy with left mid abdominal colostomy. Colon leading to the colostomy is not decompressed. Overall appearance is favored to be nonobstructive. Appendix is not discretely visualized. Stable presacral soft tissue/fluid (series 2/image 88), possibly reflecting post treatment changes (given negative biopsy) versus tumor recurrence (given hypermetabolism on PET). Associated tethering of multiple loops of bowel, the ureters (series 2/image 82), and the posterior bladder. Vascular/Lymphatic: No evidence of abdominal aortic aneurysm. Atherosclerotic calcifications of the abdominal aorta and branch vessels. Small ileocolic nodes measuring up to 11 mm short axis. Additional subcentimeter periaortic nodes. These are unchanged from the recent prior. Reproductive: Status post hysterectomy. No adnexal masses. Other: No abdominopelvic ascites. Musculoskeletal: Status post PLIF at L4-5. Degenerative changes of the lumbar spine. IMPRESSION: Status post left hemicolectomy with left mid abdominal colostomy. Mildly dilated loops of small bowel in the left upper/mid abdomen, without abrupt transition. Overall appearance is favored to be nonobstructive. Stable presacral soft tissue/fluid, possibly reflecting post treatment  changes (given negative biopsy) versus tumor recurrence (given hypermetabolism on PET). Associated tethering of multiple loops of bowel, the ureters, and the posterior bladder. Moderate right and mild left hydronephrosis with indwelling double pigtail ureteral stents. Additional stable ancillary findings as above. Electronically Signed   By: Charline Bills M.D.   On: 10/27/2022 01:28    Anti-infectives: Anti-infectives (From admission, onward)    None        Assessment/Plan Recurrent SBO in setting of complex surgical history   -No obvious transition point on CT.  Clinically improving.  Will do trial of clear liquids. If patient develops any worsening abdominal pain, distension, n/v please make NPO. Would then plan to place NGT and do SBO protocol but feel we can hold off on this currently. Hopefully patient will improve with conservative management. If patient fails to improve with conservative management, they may require exploratory surgery during admission.  FEN - CLD VTE - SCDs, Lovenox ID - None  I reviewed nursing notes, hospitalist notes, last 24 h vitals and pain scores, last 48 h intake and output, last 24 h labs and trends, and last 24 h imaging results.   LOS: 1 day    Jacinto Halim , [facility] 10/28/2022, 8:44  AM Please see Amion for pager number during day hours 7:00am-4:30pm

## 2022-10-28 NOTE — Assessment & Plan Note (Signed)
-   Database reviewed.  On Belbuca, MS Contin, and Dilaudid; sometimes lyrica

## 2022-10-28 NOTE — Hospital Course (Signed)
Lindsay Little is a 57 yo female with PMH hypertension, HLD, rectal cancer with history of resection/colostomy creation, multiple abdominal surgeries due to intra-abdominal abscesses and recurrent SBO.  She presented with nausea/vomiting and watery output from ostomy.  Symptoms began approximately Saturday morning and she presented later Saturday evening for evaluation. CT abdomen/pelvis showed mildly dilated loops of small bowel in the left upper/mid abdomen without abrupt transition.  Findings were concerning for early obstruction versus ileus.  She was admitted for general surgery consultation and further workup.

## 2022-10-28 NOTE — Assessment & Plan Note (Signed)
-   Suspected due to hypovolemia from GI losses rather than underlying infection - Continue supportive care

## 2022-10-28 NOTE — Assessment & Plan Note (Signed)
continue PPI

## 2022-10-28 NOTE — TOC Progression Note (Signed)
Transition of Care [facility]) - Progression Note    Patient Details  Name: Lindsay Little MRN: [id number] Date of Birth: 01-05-1966  Transition of Care [facility]) CM/SW Contact  Beckie Busing, RN Phone [phone]  10/28/2022, 12:18 PM  Clinical Narrative:     Transition of Care [facility]) Screening Note   Patient Details  Name: Lindsay Little Date of Birth: 10-18-1965   Transition of Care [facility]) CM/SW Contact:    Beckie Busing, RN Phone Number:.c 10/28/2022, 12:19 PM    Transition of Care Department [facility]) has reviewed patient and no TOC needs have been identified at this time. We will continue to monitor patient advancement through interdisciplinary progression rounds. If new patient transition needs arise, please place a TOC consult.          Expected Discharge Plan and Services                                               Social Determinants of Health (SDOH) Interventions SDOH Screenings   Food Insecurity: No Food Insecurity (10/27/2022)  Housing: Low Risk  (10/27/2022)  Transportation Needs: No Transportation Needs (10/27/2022)  Utilities: Not At Risk (10/27/2022)  Tobacco Use: Medium Risk (10/26/2022)    Readmission Risk Interventions    07/16/2022    3:05 PM  Readmission Risk Prevention Plan  Transportation Screening Complete  PCP or Specialist Appt within 5-7 Days Complete  Home Care Screening Complete  Medication Review (RN CM) Complete

## 2022-10-29 DIAGNOSIS — R112 Nausea with vomiting, unspecified: Secondary | ICD-10-CM

## 2022-10-29 DIAGNOSIS — Z8719 Personal history of other diseases of the digestive system: Secondary | ICD-10-CM

## 2022-10-29 MED ORDER — DIATRIZOATE MEGLUMINE & SODIUM 66-10 % PO SOLN
90.0000 mL | Freq: Once | ORAL | Status: AC
  Administered 2022-10-29: 90 mL via ORAL
  Filled 2022-10-29: qty 90

## 2022-10-29 NOTE — Progress Notes (Signed)
Progress Note    BRAELYN JENSON   XBM:[id number]  DOB: 1965/10/17  DOA: 10/26/2022     2 PCP: Laurann Montana, MD  Initial CC: N/V, abd pain  Hospital Course: Ms. Kniskern is a 57 yo female with PMH hypertension, HLD, rectal cancer with history of resection/colostomy creation, multiple abdominal surgeries due to intra-abdominal abscesses and recurrent SBO.  She presented with nausea/vomiting and watery output from ostomy.  Symptoms began approximately Saturday morning and she presented later Saturday evening for evaluation. CT abdomen/pelvis showed mildly dilated loops of small bowel in the left upper/mid abdomen without abrupt transition.  Findings were concerning for early obstruction versus ileus.  She was admitted for general surgery consultation and further workup.  Interval History:  No events overnight.  Tolerated clear liquids yesterday.  No nausea or vomiting. Advanced to FLD this morning.   Assessment and Plan: * History of small bowel obstruction - Dilated bowel loops noted on CT abdomen/pelvis.  No clear transition point.  Findings concerning for possible early obstruction versus ileus. -Patient started on bowel rest initially and fluids -Tolerated clears after admission and advanced to full liquid diet this morning per general surgery - Continue monitoring for tolerance  Nausea and vomiting - Continue antiemetics as needed - See workup above  Lactic acid acidosis-resolved as of 10/29/2022 - Suspected due to hypovolemia from GI losses rather than underlying infection - Continue supportive care  Hyperlipidemia - on crestor at home   GERD (gastroesophageal reflux disease) - continue PPI  Chronic pain syndrome - Database reviewed.  On Belbuca, MS Contin, and Dilaudid; sometimes lyrica   Old records reviewed in assessment of this patient  Antimicrobials:   DVT prophylaxis:  SCDs Start: 10/27/22 0757   Code Status:   Code Status: Full Code  Mobility  Assessment (last 72 hours)     Mobility Assessment     Row Name 10/29/22 0730 10/28/22 1945 10/28/22 0900 10/27/22 2100 10/27/22 0730   Does patient have an order for bedrest or is patient medically unstable No - Continue assessment No - Continue assessment No - Continue assessment No - Continue assessment No - Continue assessment   What is the highest level of mobility based on the progressive mobility assessment? Level 6 (Walks independently in room and hall) - Balance while walking in room without assist - Complete Level 5 (Walks with assist in room/hall) - Balance while stepping forward/back and can walk in room with assist - Complete Level 5 (Walks with assist in room/hall) - Balance while stepping forward/back and can walk in room with assist - Complete Level 5 (Walks with assist in room/hall) - Balance while stepping forward/back and can walk in room with assist - Complete Level 5 (Walks with assist in room/hall) - Balance while stepping forward/back and can walk in room with assist - Complete    Row Name 10/27/22 0600           Does patient have an order for bedrest or is patient medically unstable No - Continue assessment       What is the highest level of mobility based on the progressive mobility assessment? Level 5 (Walks with assist in room/hall) - Balance while stepping forward/back and can walk in room with assist - Complete                Barriers to discharge: none Disposition Plan:  Home 2-3 days Status is: Inpt  Objective: Blood pressure (!) 143/74, pulse 72, temperature (!) 97.4 F (36.3  C), temperature source Oral, resp. rate 18, height 5\' 11"  (1.803 m), weight 129.4 kg, SpO2 97 %.  Examination:  Physical Exam Constitutional:      Appearance: Normal appearance.  HENT:     Head: Normocephalic and atraumatic.     Mouth/Throat:     Mouth: Mucous membranes are moist.  Eyes:     Extraocular Movements: Extraocular movements intact.  Cardiovascular:     Rate and  Rhythm: Normal rate and regular rhythm.  Pulmonary:     Effort: Pulmonary effort is normal. No respiratory distress.     Breath sounds: Normal breath sounds. No wheezing.  Abdominal:     General: Bowel sounds are decreased.     Palpations: Abdomen is soft.     Tenderness: There is generalized abdominal tenderness.     Comments: Ostomy bag in place  Musculoskeletal:        General: No swelling. Normal range of motion.     Cervical back: Normal range of motion and neck supple.  Skin:    General: Skin is dry.  Neurological:     General: No focal deficit present.     Mental Status: She is alert and oriented to person, place, and time.  Psychiatric:        Mood and Affect: Mood normal.        Behavior: Behavior normal.      Consultants:  General surgery  Procedures:    Data Reviewed: No results found for this or any previous visit (from the past 24 hour(s)).   I have reviewed pertinent nursing notes, vitals, labs, and images as necessary. I have ordered labwork to follow up on as indicated.  I have reviewed the last notes from staff over past 24 hours. I have discussed patient's care plan and test results with nursing staff, CM/SW, and other staff as appropriate.  Time spent: Greater than 50% of the 55 minute visit was spent in counseling/coordination of care for the patient as laid out in the A&P.   LOS: 2 days   Lewie Chamber, MD [facility] 10/29/2022, 1:47 PM

## 2022-10-29 NOTE — Progress Notes (Signed)
Subjective: CC: Seen yesterday afternoon and doing well. Her abdominal pain was back to her baseline, nausea resolved, tolerating cld and having ostomy output. Adv to FLD.   Reports last night she developed some nausea and did not try any of the FLD. Hasn't had much out from her ostomy since I saw her yesterday (still some liquid stool and air in bag but reports she hasn't emptied it since I saw her yesterday). Nausea resolved this am and she just tried FLD. Abdominal pain remains stable/at her baseline.   Objective: Vital signs in last 24 hours: Temp:  [97.4 F (36.3 C)-97.9 F (36.6 C)] 97.4 F (36.3 C) (04/16 0546) Pulse Rate:  [66-91] 72 (04/16 0546) Resp:  [16-18] 18 (04/16 0546) BP: (104-143)/(67-79) 143/74 (04/16 0546) SpO2:  [93 %-100 %] 97 % (04/16 0546) Last BM Date : 10/28/22  Intake/Output from previous day: 04/15 0701 - 04/16 0700 In: 5564.6 [P.O.:240; I.V.:5324.6] Out: -  Intake/Output this shift: No intake/output data recorded.  PE: Gen:  Alert, NAD, pleasant Abd: Soft, no obvious distension, mild generalized ttp, no rigidity or guarding. +BS. Stoma budded and viable. Liquid stool in ostomy bag.   Lab Results:  Recent Labs    10/27/22 0827 10/28/22 0559  WBC 15.4* 9.0  HGB 13.9 11.4*  HCT 45.7 38.6  PLT 356 306    BMET Recent Labs    10/26/22 2226 10/27/22 0827 10/28/22 0559  NA 138  --  137  K 3.9  --  3.9  CL 100  --  106  CO2 23  --  23  GLUCOSE 173*  --  97  BUN 16  --  15  CREATININE 1.10* 0.97 0.80  CALCIUM 12.3*  --  8.5*    PT/INR No results for input(s): "LABPROT", "INR" in the last 72 hours. CMP     Component Value Date/Time   NA 137 10/28/2022 0559   NA 140 05/30/2017 1009   K 3.9 10/28/2022 0559   K 4.3 05/30/2017 1009   CL 106 10/28/2022 0559   CO2 23 10/28/2022 0559   CO2 25 05/30/2017 1009   GLUCOSE 97 10/28/2022 0559   GLUCOSE 97 05/30/2017 1009   BUN 15 10/28/2022 0559   BUN 17.7 05/30/2017 1009    CREATININE 0.80 10/28/2022 0559   CREATININE 0.71 07/01/2019 1339   CREATININE 0.7 05/30/2017 1009   CALCIUM 8.5 (L) 10/28/2022 0559   CALCIUM 9.7 05/30/2017 1009   PROT 9.9 (H) 10/26/2022 2226   PROT 7.2 05/30/2017 1009   ALBUMIN 4.9 10/26/2022 2226   ALBUMIN 3.8 05/30/2017 1009   AST 15 10/26/2022 2226   AST 14 (L) 07/01/2019 1339   AST 16 05/30/2017 1009   ALT 11 10/26/2022 2226   ALT 12 07/01/2019 1339   ALT 22 05/30/2017 1009   ALKPHOS 74 10/26/2022 2226   ALKPHOS 89 05/30/2017 1009   BILITOT 0.7 10/26/2022 2226   BILITOT 0.3 07/01/2019 1339   BILITOT 0.52 05/30/2017 1009   GFRNONAA >60 10/28/2022 0559   GFRNONAA >60 07/01/2019 1339   GFRAA >60 08/25/2019 1326   GFRAA >60 07/01/2019 1339   Lipase     Component Value Date/Time   LIPASE <10 (L) 10/26/2022 2226    Studies/Results: No results found.  Anti-infectives: Anti-infectives (From admission, onward)    None        Assessment/Plan Recurrent SBO in setting of complex surgical history   - No obvious transition point on CT.   -  Will how she does with FLD this am (discussed below plan with RN) - If tolerates can advance to soft diet. If tolerating soft diet without worsening abdominal pain, n/v + having bowel function could go home later today - If patient has worsening abdominal pain or nausea after FLD would back her down to CLD and do SBO protocol orally.  - If develops any vomiting would recommend NGT and SBO protocol - I do not think she needs emergency surgery at this time. Hopefully patient will improve with conservative management. If patient fails to improve with conservative management, they may require exploratory surgery during admission.  FEN - FLD. Plan as above.  VTE - SCDs, Lovenox ID - None  I reviewed nursing notes, hospitalist notes, last 24 h vitals and pain scores, last 48 h intake and output, last 24 h labs and trends, and last 24 h imaging results.   LOS: 2 days    Jacinto Halim , [facility] 10/29/2022, 8:41 AM Please see Amion for pager number during day hours 7:00am-4:30pm

## 2022-10-30 ENCOUNTER — Inpatient Hospital Stay (HOSPITAL_COMMUNITY): Payer: Medicare HMO

## 2022-10-30 DIAGNOSIS — K56609 Unspecified intestinal obstruction, unspecified as to partial versus complete obstruction: Secondary | ICD-10-CM

## 2022-10-30 MED ORDER — POLYETHYLENE GLYCOL 3350 17 G PO PACK
17.0000 g | PACK | Freq: Every day | ORAL | Status: DC
  Administered 2022-10-30: 17 g via ORAL
  Filled 2022-10-30 (×2): qty 1

## 2022-10-30 MED ORDER — PANTOPRAZOLE SODIUM 40 MG PO TBEC
40.0000 mg | DELAYED_RELEASE_TABLET | Freq: Two times a day (BID) | ORAL | Status: DC
  Administered 2022-10-30 – 2022-10-31 (×2): 40 mg via ORAL
  Filled 2022-10-30 (×2): qty 1

## 2022-10-30 MED ORDER — ROSUVASTATIN CALCIUM 5 MG PO TABS
5.0000 mg | ORAL_TABLET | Freq: Every day | ORAL | Status: DC
  Administered 2022-10-30: 5 mg via ORAL
  Filled 2022-10-30: qty 1

## 2022-10-30 MED ORDER — FAMOTIDINE 20 MG PO TABS
20.0000 mg | ORAL_TABLET | Freq: Two times a day (BID) | ORAL | Status: DC
  Administered 2022-10-30 – 2022-10-31 (×3): 20 mg via ORAL
  Filled 2022-10-30 (×3): qty 1

## 2022-10-30 MED ORDER — MELATONIN 3 MG PO TABS: 3.0000 mg | ORAL_TABLET | Freq: Every evening | ORAL | Status: DC | PRN

## 2022-10-30 MED ORDER — MORPHINE SULFATE ER 15 MG PO TBCR
15.0000 mg | EXTENDED_RELEASE_TABLET | Freq: Two times a day (BID) | ORAL | Status: DC
  Administered 2022-10-30 – 2022-10-31 (×3): 15 mg via ORAL
  Filled 2022-10-30 (×3): qty 1

## 2022-10-30 MED ORDER — DOCUSATE SODIUM 100 MG PO CAPS
100.0000 mg | ORAL_CAPSULE | Freq: Two times a day (BID) | ORAL | Status: DC
  Administered 2022-10-30 – 2022-10-31 (×3): 100 mg via ORAL
  Filled 2022-10-30 (×3): qty 1

## 2022-10-30 NOTE — Progress Notes (Addendum)
PROGRESS NOTE    Lindsay Little  ZOX:[id number] DOB: Dec 06, 1965 DOA: 10/26/2022 PCP: Laurann Montana, MD     Brief Narrative:  Lindsay Little is a 57 yo female with PMH hypertension, HLD, rectal cancer with history of resection/colostomy creation, multiple abdominal surgeries due to intra-abdominal abscesses and recurrent SBO. She presented with nausea/vomiting and watery output from ostomy.  Symptoms began approximately Saturday morning and she presented later Saturday evening for evaluation. CT abdomen/pelvis showed mildly dilated loops of small bowel in the left upper/mid abdomen without abrupt transition.  Findings were concerning for early obstruction versus ileus.  She was admitted for general surgery consultation and further workup.   New events last 24 hours / Subjective: Patient is slowly improving.  Her diet was advanced to dysphagia diet.  Still remains on IV narcotics, this will be discontinued in favor of resuming her home oral medications.  Assessment & Plan:   Principal Problem:   SBO (small bowel obstruction) Active Problems:   History of small bowel obstruction   Nausea and vomiting   Crohn's disease (HCC)   Chronic pain syndrome   GERD (gastroesophageal reflux disease)   Hyperlipidemia   Recurrent SBO -Appreciate general surgery.  Discussed with surgery team today -Abdominal imaging today reveals p.o. contrast and large bowel -Continue soft, dysphagia diet  Hyperlipidemia -Crestor  GERD -PPI  Chronic pain syndrome -Managed by outpatient pain clinic.  Resume home oral Dilaudid, MS contin, Cymbalta.  Discontinue IV narcotics today. Reviewed PDMP today   Seizure disorder -Keppra  Hypothyroidism -Synthroid    DVT prophylaxis: SCDs Start: 10/27/22 0757  Code Status: Full code Family Communication: No family at bedside Disposition Plan: Home Status is: Inpatient Remains inpatient appropriate because: Still requiring IV pain medication, transition  to oral regimen today.  Hopeful discharge home 4/18    Antimicrobials:  Anti-infectives (From admission, onward)    None        Objective: Vitals:   10/29/22 0546 10/29/22 1455 10/29/22 1958 10/30/22 0408  BP: (!) 143/74 (!) 152/87 (!) 140/72 (!) 142/81  Pulse: 72 (!) 107 (!) 101 73  Resp: Temp: (!) 97.4 F (36.3 C) (!) 97.5 F (36.4 C) 98.2 F (36.8 C) 97.9 F (36.6 C)  TempSrc: Oral Oral Oral Oral  SpO2: 97% 97% 94% 94%  Weight:      Height:        Intake/Output Summary (Last 24 hours) at 10/30/2022 1131 Last data filed at 10/29/2022 1300 Gross per 24 hour  Intake 240 ml  Output --  Net 240 ml   Filed Weights   10/26/22 2142 10/27/22 0546  Weight: 131.5 kg 129.4 kg    Examination:  General exam: Appears calm and comfortable  Respiratory system: Clear to auscultation. Respiratory effort normal. No respiratory distress. No conversational dyspnea.  Cardiovascular system: S1 & S2 heard, RRR. No murmurs. No pedal edema. Gastrointestinal system: Abdomen is nondistended, soft and nontender. + Colostomy Central nervous system: Alert and oriented. No focal neurological deficits. Speech clear.  Extremities: Symmetric in appearance  Skin: No rashes, lesions or ulcers on exposed skin  Psychiatry: Judgement and insight appear normal. Mood & affect appropriate.   Data Reviewed: I have personally reviewed following labs and imaging studies  CBC: Recent Labs  Lab 10/26/22 2226 10/27/22 0827 10/28/22 0559  WBC 18.2* 15.4* 9.0  NEUTROABS 15.2*  --   --   HGB 15.2* 13.9 11.4*  HCT 48.4* 45.7 38.6  MCV 80.3 82.8 86.0  PLT 541* 356 306   Basic Metabolic Panel: Recent Labs  Lab 10/26/22 2226 10/27/22 0827 10/28/22 0559  NA 138  --  137  K 3.9  --  3.9  CL 100  --  106  CO2 23  --  23  GLUCOSE 173*  --  97  BUN 16  --  15  CREATININE 1.10* 0.97 0.80  CALCIUM 12.3*  --  8.5*   GFR: Estimated Creatinine Clearance: 118.2 mL/min (by C-G formula  based on SCr of 0.8 mg/dL). Liver Function Tests: Recent Labs  Lab 10/26/22 2226  AST 15  ALT 11  ALKPHOS 74  BILITOT 0.7  PROT 9.9*  ALBUMIN 4.9   Recent Labs  Lab 10/26/22 2226  LIPASE <10*   No results for input(s): "AMMONIA" in the last 168 hours. Coagulation Profile: No results for input(s): "INR", "PROTIME" in the last 168 hours. Cardiac Enzymes: No results for input(s): "CKTOTAL", "CKMB", "CKMBINDEX", "TROPONINI" in the last 168 hours. BNP (last 3 results) No results for input(s): "PROBNP" in the last 8760 hours. HbA1C: No results for input(s): "HGBA1C" in the last 72 hours. CBG: No results for input(s): "GLUCAP" in the last 168 hours. Lipid Profile: No results for input(s): "CHOL", "HDL", "LDLCALC", "TRIG", "CHOLHDL", "LDLDIRECT" in the last 72 hours. Thyroid Function Tests: No results for input(s): "TSH", "T4TOTAL", "FREET4", "T3FREE", "THYROIDAB" in the last 72 hours. Anemia Panel: No results for input(s): "VITAMINB12", "FOLATE", "FERRITIN", "TIBC", "IRON", "RETICCTPCT" in the last 72 hours. Sepsis Labs: Recent Labs  Lab 10/26/22 2234 10/27/22 0056 10/27/22 1548  LATICACIDVEN 2.1* 2.2* 1.2    No results found for this or any previous visit (from the past 240 hour(s)).    Radiology Studies: DG Abd Portable 1V-Small Bowel Obstruction Protocol-initial, 8 hr delay  Result Date: 10/30/2022 CLINICAL DATA:  Small bowel protocol, 8 hours post contrast film EXAM: PORTABLE ABDOMEN - 1 VIEW COMPARISON:  X-ray abdomen 07/30/2022, CT abdomen pelvis 10/27/2022 FINDINGS: Ureteral stents noted. PO contrast reaches the large bowel at least up to the hepatic flexure. The bowel gas pattern is normal. No radio-opaque calculi or other significant radiographic abnormality are seen. IMPRESSION: PO contrast reaches the large bowel. Electronically Signed   By: Tish Frederickson M.D.   On: 10/30/2022 01:21      Scheduled Meds:  docusate sodium  100 mg Oral BID   DULoxetine  120  mg Oral Daily   enoxaparin (LOVENOX) injection  60 mg Subcutaneous Q24H   levETIRAcetam  500 mg Oral BID   levothyroxine  50 mcg Oral q morning   oxybutynin  15 mg Oral QHS   pantoprazole (PROTONIX) IV  40 mg Intravenous Q12H   polyethylene glycol  17 g Oral Daily   pregabalin  225 mg Oral BID   Continuous Infusions:   LOS: 3 days   Time spent: 25 minutes   [name], DO [facility] 10/30/2022, 11:31 AM   Available via Epic secure chat 7am-7pm After these hours, please refer to coverage provider listed on [email or web address]

## 2022-10-30 NOTE — Progress Notes (Addendum)
Subjective: CC: Still having occasional nausea that she reports is not related to po intake. We ordered po gastrografin yesterday > contrast in colon this am. She just woke up but feels she is better today and much better since admission. Lower abdominal pain is at her baseline. Still some mild upper abdominal pain. Having ostomy output (ostomy bag was full last night).   Objective: Vital signs in last 24 hours: Temp:  [97.5 F (36.4 C)-98.2 F (36.8 C)] 97.9 F (36.6 C) (04/17 0408) Pulse Rate:  [73-107] 73 (04/17 0408) Resp:  [15-18] 18 (04/17 0408) BP: (140-152)/(72-87) 142/81 (04/17 0408) SpO2:  [94 %-97 %] 94 % (04/17 0408) Last BM Date : 10/28/22  Intake/Output from previous day: 04/16 0701 - 04/17 0700 In: 480 [P.O.:480] Out: -  Intake/Output this shift: No intake/output data recorded.  PE: Gen:  Alert, NAD, pleasant Abd: Soft, no obvious distension, mild generalized ttp that appears stable, no rigidity or guarding. +BS. Stoma budded and viable. Liquid stool in ostomy bag.   Lab Results:  Recent Labs    10/28/22 0559  WBC 9.0  HGB 11.4*  HCT 38.6  PLT 306    BMET Recent Labs    10/28/22 0559  NA 137  K 3.9  CL 106  CO2 23  GLUCOSE 97  BUN 15  CREATININE 0.80  CALCIUM 8.5*    PT/INR No results for input(s): "LABPROT", "INR" in the last 72 hours. CMP     Component Value Date/Time   NA 137 10/28/2022 0559   NA 140 05/30/2017 1009   K 3.9 10/28/2022 0559   K 4.3 05/30/2017 1009   CL 106 10/28/2022 0559   CO2 23 10/28/2022 0559   CO2 25 05/30/2017 1009   GLUCOSE 97 10/28/2022 0559   GLUCOSE 97 05/30/2017 1009   BUN 15 10/28/2022 0559   BUN 17.7 05/30/2017 1009   CREATININE 0.80 10/28/2022 0559   CREATININE 0.71 07/01/2019 1339   CREATININE 0.7 05/30/2017 1009   CALCIUM 8.5 (L) 10/28/2022 0559   CALCIUM 9.7 05/30/2017 1009   PROT 9.9 (H) 10/26/2022 2226   PROT 7.2 05/30/2017 1009   ALBUMIN 4.9 10/26/2022 2226   ALBUMIN 3.8  05/30/2017 1009   AST 15 10/26/2022 2226   AST 14 (L) 07/01/2019 1339   AST 16 05/30/2017 1009   ALT 11 10/26/2022 2226   ALT 12 07/01/2019 1339   ALT 22 05/30/2017 1009   ALKPHOS 74 10/26/2022 2226   ALKPHOS 89 05/30/2017 1009   BILITOT 0.7 10/26/2022 2226   BILITOT 0.3 07/01/2019 1339   BILITOT 0.52 05/30/2017 1009   GFRNONAA >60 10/28/2022 0559   GFRNONAA >60 07/01/2019 1339   GFRAA >60 08/25/2019 1326   GFRAA >60 07/01/2019 1339   Lipase     Component Value Date/Time   LIPASE <10 (L) 10/26/2022 2226    Studies/Results: DG Abd Portable 1V-Small Bowel Obstruction Protocol-initial, 8 hr delay  Result Date: 10/30/2022 CLINICAL DATA:  Small bowel protocol, 8 hours post contrast film EXAM: PORTABLE ABDOMEN - 1 VIEW COMPARISON:  X-ray abdomen 07/30/2022, CT abdomen pelvis 10/27/2022 FINDINGS: Ureteral stents noted. PO contrast reaches the large bowel at least up to the hepatic flexure. The bowel gas pattern is normal. No radio-opaque calculi or other significant radiographic abnormality are seen. IMPRESSION: PO contrast reaches the large bowel. Electronically Signed   By: Tish Frederickson M.D.   On: 10/30/2022 01:21    Anti-infectives: Anti-infectives (From admission, onward)  None        Assessment/Plan Recurrent SBO in setting of complex surgical history   - No obvious transition point on CT.  Ileus vs pSBO - Appears patient is improving from sbo. She is tolerating soft diet, having bowel function and contrast in her colon on xray. Her wbc and lactic have normalized on last check. She is HDS without fever or hypotension. Tachycardia resolved. However she still is having some nausea and is still taking IV pain mediation. Would like to see her pain well controlled on po medications before discharge and her tolerating soft diet without nausea. She reports at baseline she takes Morphine ER  tab BID + Hydromorphone 4 Mg Tablet every 6 hours as needed for pain. This is c/w PDMP.  Reached out to primary and pharmacy to ensure they are okay with restarting this and d/c'ing IV pain medication. If her pain is well tolerated on home regimen, she is tolerating soft diet without n/v, and continues to having bowel function then she could likely d/c later today vs tomorrow am from our standpoint. If she regresses, could consider repeat CT in the next 24-48 hours.  - I do not think she needs emergency surgery at this time. Hopefully patient will improve with conservative management. If patient fails to improve with conservative management, they may require exploratory surgery during admission.  FEN - Soft diet. Add scheduled colace and miralax. Keep K > 4 and Mg > 2 for bowel function. Will check am electrolytes.  VTE - SCDs, Lovenox. Mobilize for bowel function.  ID - None  I reviewed nursing notes, hospitalist notes, last 24 h vitals and pain scores, last 48 h intake and output, last 24 h labs and trends, and last 24 h imaging results.   LOS: 3 days    Jacinto Halim , [facility] Surgery 10/30/2022, 8:42 AM Please see Amion for pager number during day hours 7:00am-4:30pm

## 2022-10-31 DIAGNOSIS — K56609 Unspecified intestinal obstruction, unspecified as to partial versus complete obstruction: Secondary | ICD-10-CM | POA: Diagnosis not present

## 2022-10-31 LAB — MAGNESIUM: Magnesium: 1.7 mg/dL (ref 1.7–2.4)

## 2022-10-31 LAB — BASIC METABOLIC PANEL
Anion gap: 9 (ref 5–15)
BUN: 9 mg/dL (ref 6–20)
CO2: 25 mmol/L (ref 22–32)
Calcium: 8.9 mg/dL (ref 8.9–10.3)
Chloride: 105 mmol/L (ref 98–111)
Creatinine, Ser: 0.85 mg/dL (ref 0.44–1.00)
GFR, Estimated: >60 mL/min (ref >60)
Glucose, Bld: 95 mg/dL (ref 70–99)
Potassium: 3.5 mmol/L (ref 3.5–5.1)
Sodium: 139 mmol/L (ref 135–145)

## 2022-10-31 LAB — PHOSPHORUS: Phosphorus: 4.3 mg/dL (ref 2.5–4.6)

## 2022-10-31 MED ORDER — DOCUSATE SODIUM 100 MG PO CAPS: 100.0000 mg | ORAL_CAPSULE | Freq: Two times a day (BID) | ORAL | 0 refills | Status: DC

## 2022-10-31 NOTE — Discharge Summary (Signed)
Physician Discharge Summary  Lindsay Little:[id number] DOB: Nov 19, 1965 DOA: 10/26/2022  PCP: Laurann Montana, MD  Admit date: 10/26/2022 Discharge date: 10/31/2022  Admitted From: Home Disposition:  Home   Recommendations for Outpatient Follow-up:  Follow up with PCP   Discharge Condition: Stable CODE STATUS: Full code Diet recommendation: Soft diet  Brief/Interim Summary: Lindsay Little is a 57 yo female with PMH hypertension, HLD, rectal cancer with history of resection/colostomy creation, multiple abdominal surgeries due to intra-abdominal abscesses and recurrent SBO. She presented with nausea/vomiting and watery output from ostomy.  Symptoms began approximately Saturday morning and she presented later Saturday evening for evaluation. CT abdomen/pelvis showed mildly dilated loops of small bowel in the left upper/mid abdomen without abrupt transition.  Findings were concerning for early obstruction versus ileus.  She was admitted for general surgery consultation and further workup. Patient was managed conservatively.  Diet was slowly advanced with good tolerance.  Discharge Diagnoses:   Principal Problem:   SBO (small bowel obstruction) Active Problems:   History of small bowel obstruction   Nausea and vomiting   Crohn's disease (HCC)   Chronic pain syndrome   GERD (gastroesophageal reflux disease)   Hyperlipidemia    Recurrent SBO -Appreciate general surgery.  Discussed with surgery team today -Abdominal imaging today reveals p.o. contrast and large bowel -Continue soft, dysphagia diet -Resolved   Hyperlipidemia -Crestor   GERD -PPI   Chronic pain syndrome -Managed by outpatient pain clinic.  Resume home meds   Seizure disorder -Keppra   Hypothyroidism -Synthroid    Discharge Instructions  Discharge Instructions     Call MD for:  difficulty breathing, headache or visual disturbances   Complete by: As directed    Call MD for:  extreme fatigue    Complete by: As directed    Call MD for:  persistant dizziness or light-headedness   Complete by: As directed    Call MD for:  persistant nausea and vomiting   Complete by: As directed    Call MD for:  severe uncontrolled pain   Complete by: As directed    Call MD for:  temperature >100.4   Complete by: As directed    Discharge instructions   Complete by: As directed    You were cared for by a hospitalist during your hospital stay. If you have any questions about your discharge medications or the care you received while you were in the hospital after you are discharged, you can call the unit and ask to speak with the hospitalist on call if the hospitalist that took care of you is not available. Once you are discharged, your primary care physician will handle any further medical issues. Please note that NO REFILLS for any discharge medications will be authorized once you are discharged, as it is imperative that you return to your primary care physician (or establish a relationship with a primary care physician if you do not have one) for your aftercare needs so that they can reassess your need for medications and monitor your lab values.   Increase activity slowly   Complete by: As directed       Allergies as of 10/31/2022       Reactions   Caine-1 [lidocaine] Swelling, Rash   Eyes swell shut; includes all caine drugs except marcaine. EMLA cream OK though (?!)   Bupropion Other (See Comments)   Not effective per Pt.    Sulfa Antibiotics Nausea And Vomiting, Rash   Pantoprazole Other (See Comments)  Not effective per Pt.    Adhesive [tape] Rash, Other (See Comments)   Blisters - can use paper tape   Doxycycline Nausea Only   Flagyl [metronidazole] Nausea Only   Iron Nausea Only   Metoprolol Nausea Only, Palpitations   Oxycodone Other (See Comments)   NIGHTMARES. (tolerates hydrocodone or tramadol better)   Penicillins Nausea Only, Rash   Has patient had a PCN reaction causing  immediate rash, facial/tongue/throat swelling, SOB or lightheadedness with hypotension: no Has patient had a PCN reaction causing severe rash involving mucus membranes or skin necrosis: no Has patient had a PCN reaction that required hospitalization no Has patient had a PCN reaction occurring within the last 10 years: no If all of the above answers are "NO", then may proceed with Cephalosporin use.        Medication List     STOP taking these medications    HYDROmorphone 4 MG tablet Commonly known as: DILAUDID       TAKE these medications    acetaminophen 500 MG tablet Commonly known as: TYLENOL Take 1,000 mg by mouth every 8 (eight) hours as needed for moderate pain.   Belbuca 450 MCG Film Generic drug: Buprenorphine HCl Place 1 Film inside cheek every 12 (twelve) hours.   celecoxib 100 MG capsule Commonly known as: CELEBREX Take 100 mg by mouth 2 (two) times daily.   CoQ10 200 MG Caps Take 200 mg by mouth at bedtime.   docusate sodium 100 MG capsule Commonly known as: COLACE Take 1 capsule (100 mg total) by mouth 2 (two) times daily.   DULoxetine 60 MG capsule Commonly known as: CYMBALTA Take 120 mg by mouth daily.   esomeprazole 40 MG capsule Commonly known as: NEXIUM Take 1 capsule (40 mg total) by mouth 2 (two) times daily before a meal.   famotidine 20 MG tablet Commonly known as: PEPCID Take 20 mg by mouth 2 (two) times daily.   furosemide 20 MG tablet Commonly known as: LASIX Take 20 mg by mouth daily as needed for edema.   levETIRAcetam 250 MG tablet Commonly known as: KEPPRA Take 500 mg by mouth 2 (two) times daily.   levothyroxine 50 MCG tablet Commonly known as: SYNTHROID Take 50 mcg by mouth every morning.   melatonin 3 MG Tabs tablet Take 3-6 mg by mouth at bedtime as needed (sleep).   morphine 15 MG 12 hr tablet Commonly known as: MS CONTIN Take 15 mg by mouth every 12 (twelve) hours.   multivitamin with minerals Tabs tablet Take  2 tablets by mouth at bedtime.   ondansetron 4 MG tablet Commonly known as: ZOFRAN Take 1 tablet (4 mg total) by mouth every 6 (six) hours as needed for nausea.   oxybutynin 15 MG 24 hr tablet Commonly known as: DITROPAN XL Take 15 mg by mouth at bedtime.   polyethylene glycol 17 g packet Commonly known as: MIRALAX / GLYCOLAX Take 17 g by mouth daily.   pregabalin 225 MG capsule Commonly known as: LYRICA Take 225 mg by mouth 2 (two) times daily.   promethazine 25 MG tablet Commonly known as: PHENERGAN TAKE ONE TABLET BY MOUTH EVERY 6 HOURS AS NEEDED FOR NAUSEA OR VOMITING What changed: See the new instructions.   rosuvastatin 5 MG tablet Commonly known as: CRESTOR Take 5 mg by mouth at bedtime.   triamcinolone lotion 0.1 % Commonly known as: KENALOG Apply 1 application topically as needed (dry skin).   Vitamin D3 125 MCG (5000 UT) Caps  Take 5,000 Units by mouth at bedtime.        Allergies  Allergen Reactions   Caine-1 [Lidocaine] Swelling and Rash    Eyes swell shut; includes all caine drugs except marcaine. EMLA cream OK though (?!)   Bupropion Other (See Comments)    Not effective per Pt.    Sulfa Antibiotics Nausea And Vomiting and Rash   Pantoprazole Other (See Comments)    Not effective per Pt.    Adhesive [Tape] Rash and Other (See Comments)    Blisters - can use paper tape   Doxycycline Nausea Only   Flagyl [Metronidazole] Nausea Only   Iron Nausea Only   Metoprolol Nausea Only and Palpitations   Oxycodone Other (See Comments)    NIGHTMARES. (tolerates hydrocodone or tramadol better)   Penicillins Nausea Only and Rash    Has patient had a PCN reaction causing immediate rash, facial/tongue/throat swelling, SOB or lightheadedness with hypotension: no Has patient had a PCN reaction causing severe rash involving mucus membranes or skin necrosis: no Has patient had a PCN reaction that required hospitalization no Has patient had a PCN reaction occurring  within the last 10 years: no If all of the above answers are "NO", then may proceed with Cephalosporin use.      Procedures/Studies: DG Abd Portable 1V-Small Bowel Obstruction Protocol-initial, 8 hr delay  Result Date: 10/30/2022 CLINICAL DATA:  Small bowel protocol, 8 hours post contrast film EXAM: PORTABLE ABDOMEN - 1 VIEW COMPARISON:  X-ray abdomen 07/30/2022, CT abdomen pelvis 10/27/2022 FINDINGS: Ureteral stents noted. PO contrast reaches the large bowel at least up to the hepatic flexure. The bowel gas pattern is normal. No radio-opaque calculi or other significant radiographic abnormality are seen. IMPRESSION: PO contrast reaches the large bowel. Electronically Signed   By: Tish Frederickson M.D.   On: 10/30/2022 01:21   CT ABDOMEN PELVIS W CONTRAST  Result Date: 10/27/2022 CLINICAL DATA:  Nausea/vomiting, diarrhea. History of rectal carcinoma, status post surgery, chemotherapy, and radiation. EXAM: CT ABDOMEN AND PELVIS WITH CONTRAST TECHNIQUE: Multidetector CT imaging of the abdomen and pelvis was performed using the standard protocol following bolus administration of intravenous contrast. RADIATION DOSE REDUCTION: This exam was performed according to the departmental dose-optimization program which includes automated exposure control, adjustment of the mA and/or kV according to patient size and/or use of iterative reconstruction technique. CONTRAST:  OMNIPAQUE IOHEXOL 300 MG/ML  SOLN COMPARISON:  Multiple CTs, most recently 10/04/2022. PET-CT dated 03/06/2022. FINDINGS: Lower chest: Mild subpleural scarring in the right lower lobe, favored to be post infectious inflammatory. Hepatobiliary: Liver is within normal limits. Gallbladder is unremarkable. No intrahepatic or extrahepatic dilatation. Pancreas: Within normal limits. Spleen: Within normal limits. Adrenals/Urinary Tract: Adrenal glands are within normal limits. Kidneys are notable for moderate right and mild left hydronephrosis with  indwelling double pigtail ureteral stents. Thick-walled bladder, although underdistended. Stomach/Bowel: Stomach is within normal limits. Mildly dilated loops of small bowel in the left upper/mid abdomen, without abrupt transition. Suture line in the left lower pelvis (series 2/image 76). Status post left hemicolectomy with left mid abdominal colostomy. Colon leading to the colostomy is not decompressed. Overall appearance is favored to be nonobstructive. Appendix is not discretely visualized. Stable presacral soft tissue/fluid (series 2/image 88), possibly reflecting post treatment changes (given negative biopsy) versus tumor recurrence (given hypermetabolism on PET). Associated tethering of multiple loops of bowel, the ureters (series 2/image 82), and the posterior bladder. Vascular/Lymphatic: No evidence of abdominal aortic aneurysm. Atherosclerotic calcifications of the abdominal  aorta and branch vessels. Small ileocolic nodes measuring up to 11 mm short axis. Additional subcentimeter periaortic nodes. These are unchanged from the recent prior. Reproductive: Status post hysterectomy. No adnexal masses. Other: No abdominopelvic ascites. Musculoskeletal: Status post PLIF at L4-5. Degenerative changes of the lumbar spine. IMPRESSION: Status post left hemicolectomy with left mid abdominal colostomy. Mildly dilated loops of small bowel in the left upper/mid abdomen, without abrupt transition. Overall appearance is favored to be nonobstructive. Stable presacral soft tissue/fluid, possibly reflecting post treatment changes (given negative biopsy) versus tumor recurrence (given hypermetabolism on PET). Associated tethering of multiple loops of bowel, the ureters, and the posterior bladder. Moderate right and mild left hydronephrosis with indwelling double pigtail ureteral stents. Additional stable ancillary findings as above. Electronically Signed   By: Charline Bills M.D.   On: 10/27/2022 01:28   CT ABDOMEN PELVIS  W CONTRAST  Result Date: 10/04/2022 CLINICAL DATA:  Abdominal pain and nausea. Lymphocytosis. Previous surgery, chemotherapy, and radiation therapy for rectal carcinoma. * Tracking Code: BO * EXAM: CT ABDOMEN AND PELVIS WITH CONTRAST TECHNIQUE: Multidetector CT imaging of the abdomen and pelvis was performed using the standard protocol following bolus administration of intravenous contrast. RADIATION DOSE REDUCTION: This exam was performed according to the departmental dose-optimization program which includes automated exposure control, adjustment of the mA and/or kV according to patient size and/or use of iterative reconstruction technique. CONTRAST:  ISOVUE-300 IOPAMIDOL (ISOVUE-300) INJECTION 61% COMPARISON:  07/27/2022 FINDINGS: Lower Chest: No acute findings. Hepatobiliary: Mild diffuse hepatic steatosis. No hepatic masses identified. Gallbladder is unremarkable. No evidence of biliary ductal dilatation. Pancreas:  No mass or inflammatory changes. Spleen: Within normal limits in size and appearance. Adrenals/Urinary Tract: No suspicious masses identified. Bilateral ureteral stents remain in place, without hydronephrosis. Stomach/Bowel: Prior APR with left upper quadrant colostomy. Soft tissue density in the presacral regions and along the pelvic sidewalls encasing both ureters remains stable, likely due to posttreatment changes. No evidence of obstruction, inflammatory process or abnormal fluid collections. Vascular/Lymphatic: Sub-centimeter retroperitoneal lymph nodes in the paraaortic bilateral common iliac regions remains stable. Several right lower quadrant mesenteric lymph nodes measuring up to 11 mm are also unchanged. No new or increased sites of lymphadenopathy identified. No acute vascular findings. Reproductive:  Prior hysterectomy again noted. Other:  None. Musculoskeletal: No suspicious bone lesions identified. Lumbar spine fusion hardware again noted. IMPRESSION: No acute findings. No new  or progressive disease within the abdomen or pelvis. Stable small retroperitoneal and right lower quadrant mesenteric lymph nodes. Stable soft tissue density in the presacral regions and along the pelvic sidewalls, likely due to posttreatment changes. Bilateral ureteral stents in appropriate position, without hydronephrosis. Mild hepatic steatosis. Electronically Signed   By: Danae Orleans M.D.   On: 10/04/2022 14:04      Discharge Exam: Vitals:   10/30/22 2131 10/31/22 0459  BP: 131/79 133/74  Pulse: 81 74  Resp: 18 18  Temp: 98.3 F (36.8 C) 98.2 F (36.8 C)  SpO2: 97% 99%    General: Pt is alert, awake, not in acute distress Cardiovascular: RRR, S1/S2 +, no edema Respiratory: CTA bilaterally, no wheezing, no rhonchi, no respiratory distress, no conversational dyspnea  Abdominal: Soft, NT, ND, bowel sounds + Extremities: no edema, no cyanosis Psych: Normal mood and affect, stable judgement and insight     The results of significant diagnostics from this hospitalization (including imaging, microbiology, ancillary and laboratory) are listed below for reference.     Microbiology: No results found for this or  any previous visit (from the past 240 hour(s)).   Labs: BNP (last 3 results) No results for input(s): "BNP" in the last 8760 hours. Basic Metabolic Panel: Recent Labs  Lab 10/26/22 2226 10/27/22 0827 10/28/22 0559 10/31/22 0643  NA 138  --  137 139  K 3.9  --  3.9 3.5  CL 100  --  106 105  CO2 23  --  23 25  GLUCOSE 173*  --  97 95  BUN 16  --  15 9  CREATININE 1.10* 0.97 0.80 0.85  CALCIUM 12.3*  --  8.5* 8.9  MG  --   --   --  1.7  PHOS  --   --   --  4.3   Liver Function Tests: Recent Labs  Lab 10/26/22 2226  AST 15  ALT 11  ALKPHOS 74  BILITOT 0.7  PROT 9.9*  ALBUMIN 4.9   Recent Labs  Lab 10/26/22 2226  LIPASE <10*   No results for input(s): "AMMONIA" in the last 168 hours. CBC: Recent Labs  Lab 10/26/22 2226 10/27/22 0827 10/28/22 0559   WBC 18.2* 15.4* 9.0  NEUTROABS 15.2*  --   --   HGB 15.2* 13.9 11.4*  HCT 48.4* 45.7 38.6  MCV 80.3 82.8 86.0  PLT 541* 356 306   Cardiac Enzymes: No results for input(s): "CKTOTAL", "CKMB", "CKMBINDEX", "TROPONINI" in the last 168 hours. BNP: Invalid input(s): "POCBNP" CBG: No results for input(s): "GLUCAP" in the last 168 hours. D-Dimer No results for input(s): "DDIMER" in the last 72 hours. Hgb A1c No results for input(s): "HGBA1C" in the last 72 hours. Lipid Profile No results for input(s): "CHOL", "HDL", "LDLCALC", "TRIG", "CHOLHDL", "LDLDIRECT" in the last 72 hours. Thyroid function studies No results for input(s): "TSH", "T4TOTAL", "T3FREE", "THYROIDAB" in the last 72 hours.  Invalid input(s): "FREET3" Anemia work up No results for input(s): "VITAMINB12", "FOLATE", "FERRITIN", "TIBC", "IRON", "RETICCTPCT" in the last 72 hours. Urinalysis    Component Value Date/Time   COLORURINE YELLOW 07/28/2022 1448   APPEARANCEUR HAZY (A) 07/28/2022 1448   LABSPEC 1.034 (H) 07/28/2022 1448   LABSPEC 1.010 02/26/2016 1604   PHURINE 5.0 07/28/2022 1448   GLUCOSEU NEGATIVE 07/28/2022 1448   GLUCOSEU Negative 02/26/2016 1604   HGBUR LARGE (A) 07/28/2022 1448   BILIRUBINUR NEGATIVE 07/28/2022 1448   BILIRUBINUR Negative 02/26/2016 1604   KETONESUR 5 (A) 07/28/2022 1448   PROTEINUR >=300 (A) 07/28/2022 1448   UROBILINOGEN 0.2 02/26/2016 1604   NITRITE NEGATIVE 07/28/2022 1448   LEUKOCYTESUR MODERATE (A) 07/28/2022 1448   LEUKOCYTESUR Trace 02/26/2016 1604   Sepsis Labs Recent Labs  Lab 10/26/22 2226 10/27/22 0827 10/28/22 0559  WBC 18.2* 15.4* 9.0   Microbiology No results found for this or any previous visit (from the past 240 hour(s)).   Patient was seen and examined on the day of discharge and was found to be in stable condition. Time coordinating discharge: 25 minutes including assessment and coordination of care, as well as examination of the patient.    SIGNED:  [name], DO [facility] 10/31/2022, 10:08 AM

## 2022-10-31 NOTE — Care Management Important Message (Signed)
Important Message  Patient Details IM Letter mailed due to Patient discharged. Name: Lindsay Little MRN: [id number] Date of Birth: [birth year]   Medicare Important Message Given:       Caren Macadam 10/31/2022, 3:06 PM

## 2022-10-31 NOTE — Progress Notes (Signed)
Subjective: CC: Back on home pain medication regimen. Lower abdominal pain at baseline. Her upper abdominal pain resolved. Had some nausea last night/early this am that has since resolved this morning. Tolerated all of her breakfast tray without any new/worsening abdominal pain or n/v. Having ostomy output.   Objective: Vital signs in last 24 hours: Temp:  [97.9 F (36.6 C)-98.3 F (36.8 C)] 98.2 F (36.8 C) (04/18 0459) Pulse Rate:  [74-81] 74 (04/18 0459) Resp:  [14-18] 18 (04/18 0459) BP: (131-154)/(74-79) 133/74 (04/18 0459) SpO2:  [91 %-99 %] 99 % (04/18 0459) Last BM Date :  (Some stool present in ostomy bag)  Intake/Output from previous day: No intake/output data recorded. Intake/Output this shift: No intake/output data recorded.  PE: Gen:  Alert, NAD, pleasant Abd: Soft, no obvious distension, NT today, no rigidity or guarding. +BS. Stoma budded and viable. Liquid stool in ostomy bag.   Lab Results:  No results for input(s): "WBC", "HGB", "HCT", "PLT" in the last 72 hours.  BMET Recent Labs    10/31/22 0643  NA 139  K 3.5  CL 105  CO2 25  GLUCOSE 95  BUN 9  CREATININE 0.85  CALCIUM 8.9    PT/INR No results for input(s): "LABPROT", "INR" in the last 72 hours. CMP     Component Value Date/Time   NA 139 10/31/2022 0643   NA 140 05/30/2017 1009   K 3.5 10/31/2022 0643   K 4.3 05/30/2017 1009   CL 105 10/31/2022 0643   CO2 25 10/31/2022 0643   CO2 25 05/30/2017 1009   GLUCOSE 95 10/31/2022 0643   GLUCOSE 97 05/30/2017 1009   BUN 9 10/31/2022 0643   BUN 17.7 05/30/2017 1009   CREATININE 0.85 10/31/2022 0643   CREATININE 0.71 07/01/2019 1339   CREATININE 0.7 05/30/2017 1009   CALCIUM 8.9 10/31/2022 0643   CALCIUM 9.7 05/30/2017 1009   PROT 9.9 (H) 10/26/2022 2226   PROT 7.2 05/30/2017 1009   ALBUMIN 4.9 10/26/2022 2226   ALBUMIN 3.8 05/30/2017 1009   AST 15 10/26/2022 2226   AST 14 (L) 07/01/2019 1339   AST 16 05/30/2017 1009   ALT 11  10/26/2022 2226   ALT 12 07/01/2019 1339   ALT 22 05/30/2017 1009   ALKPHOS 74 10/26/2022 2226   ALKPHOS 89 05/30/2017 1009   BILITOT 0.7 10/26/2022 2226   BILITOT 0.3 07/01/2019 1339   BILITOT 0.52 05/30/2017 1009   GFRNONAA >60 10/31/2022 0643   GFRNONAA >60 07/01/2019 1339   GFRAA >60 08/25/2019 1326   GFRAA >60 07/01/2019 1339   Lipase     Component Value Date/Time   LIPASE <10 (L) 10/26/2022 2226    Studies/Results: DG Abd Portable 1V-Small Bowel Obstruction Protocol-initial, 8 hr delay  Result Date: 10/30/2022 CLINICAL DATA:  Small bowel protocol, 8 hours post contrast film EXAM: PORTABLE ABDOMEN - 1 VIEW COMPARISON:  X-ray abdomen 07/30/2022, CT abdomen pelvis 10/27/2022 FINDINGS: Ureteral stents noted. PO contrast reaches the large bowel at least up to the hepatic flexure. The bowel gas pattern is normal. No radio-opaque calculi or other significant radiographic abnormality are seen. IMPRESSION: PO contrast reaches the large bowel. Electronically Signed   By: Tish Frederickson M.D.   On: 10/30/2022 01:21    Anti-infectives: Anti-infectives (From admission, onward)    None        Assessment/Plan Recurrent SBO in setting of complex surgical history   - No obvious transition point on CT.  Ileus vs  pSBO - Her presenting upper abdominal pain has resolved, tolerating diet without n/v, having bowel function and contrast in her colon on xray. Her wbc and lactic have normalized on last check. She is HDS without fever, tachycardia or hypotension. Exam reassuring as above. Okay for discharge from our standpoint. Will let primary team know.   FEN - Soft diet. Cont bowel regimen VTE - SCDs, Lovenox. ID - None  I reviewed nursing notes, hospitalist notes, last 24 h vitals and pain scores, last 48 h intake and output, last 24 h labs and trends, and last 24 h imaging results.   LOS: 4 days    Jacinto Halim , [facility] Surgery 10/31/2022, 8:38 AM Please see Amion  for pager number during day hours 7:00am-4:30pm

## 2022-10-31 NOTE — TOC Transition Note (Signed)
Transition of Care [facility]) - CM/SW Discharge Note   Patient Details  Name: Lindsay Little MRN: [id number] Date of Birth: 1966/04/08  [facility] CM/SW Contact:  Beckie Busing, RN Phone Number:[phone]  10/31/2022, 10:47 AM   Clinical Narrative:    Patient discharging no TOC needs.          Patient Goals and CMS Choice      Discharge Placement                         Discharge Plan and Services Additional resources added to the After Visit Summary for                                       Social Determinants of Health (SDOH) Interventions SDOH Screenings   Food Insecurity: No Food Insecurity (10/27/2022)  Housing: Low Risk  (10/27/2022)  Transportation Needs: No Transportation Needs (10/27/2022)  Utilities: Not At Risk (10/27/2022)  Tobacco Use: Medium Risk (10/26/2022)     Readmission Risk Interventions    07/16/2022    3:05 PM  Readmission Risk Prevention Plan  Transportation Screening Complete  PCP or Specialist Appt within 5-7 Days Complete  Home Care Screening Complete  Medication Review (RN CM) Complete

## 2022-11-07 DIAGNOSIS — M961 Postlaminectomy syndrome, not elsewhere classified: Secondary | ICD-10-CM | POA: Diagnosis not present

## 2022-11-08 ENCOUNTER — Encounter (HOSPITAL_COMMUNITY): Payer: Self-pay

## 2022-11-08 ENCOUNTER — Other Ambulatory Visit: Payer: Self-pay

## 2022-11-08 ENCOUNTER — Emergency Department (HOSPITAL_COMMUNITY): Payer: Medicare HMO

## 2022-11-08 ENCOUNTER — Emergency Department (HOSPITAL_COMMUNITY)
Admission: EM | Admit: 2022-11-08 | Discharge: 2022-11-09 | Disposition: A | Discharge status: Home / Self Care | Payer: Medicare HMO | Attending: Emergency Medicine | Admitting: Emergency Medicine

## 2022-11-08 DIAGNOSIS — N1330 Unspecified hydronephrosis: Secondary | ICD-10-CM | POA: Diagnosis not present

## 2022-11-08 DIAGNOSIS — R000 Tachycardia, unspecified: Secondary | ICD-10-CM | POA: Diagnosis not present

## 2022-11-08 DIAGNOSIS — Z85048 Personal history of other malignant neoplasm of rectum, rectosigmoid junction, and anus: Secondary | ICD-10-CM | POA: Insufficient documentation

## 2022-11-08 DIAGNOSIS — R110 Nausea: Secondary | ICD-10-CM | POA: Insufficient documentation

## 2022-11-08 DIAGNOSIS — R1084 Generalized abdominal pain: Secondary | ICD-10-CM | POA: Diagnosis not present

## 2022-11-08 DIAGNOSIS — R1111 Vomiting without nausea: Secondary | ICD-10-CM | POA: Diagnosis not present

## 2022-11-08 DIAGNOSIS — R109 Unspecified abdominal pain: Secondary | ICD-10-CM | POA: Insufficient documentation

## 2022-11-08 DIAGNOSIS — I10 Essential (primary) hypertension: Secondary | ICD-10-CM | POA: Diagnosis not present

## 2022-11-08 LAB — CBC WITH DIFFERENTIAL/PLATELET
Abs Immature Granulocytes: 0.05 10*3/uL (ref 0.00–0.07)
Basophils Absolute: 0.1 10*3/uL (ref 0.0–0.1)
Basophils Relative: 1 %
Eosinophils Absolute: 0.2 10*3/uL (ref 0.0–0.5)
Eosinophils Relative: 1 %
HCT: 46.6 % — ABNORMAL HIGH (ref 36.0–46.0)
Hemoglobin: 14 g/dL (ref 12.0–15.0)
Immature Granulocytes: 0 %
Lymphocytes Relative: 14 %
Lymphs Abs: 1.9 10*3/uL (ref 0.7–4.0)
MCH: 24.9 pg — ABNORMAL LOW (ref 26.0–34.0)
MCHC: 30 g/dL (ref 30.0–36.0)
MCV: 82.8 fL (ref 80.0–100.0)
Monocytes Absolute: 0.8 10*3/uL (ref 0.1–1.0)
Monocytes Relative: 6 %
Neutro Abs: 10.1 10*3/uL — ABNORMAL HIGH (ref 1.7–7.7)
Neutrophils Relative %: 78 %
Platelets: 402 10*3/uL — ABNORMAL HIGH (ref 150–400)
RBC: 5.63 MIL/uL — ABNORMAL HIGH (ref 3.87–5.11)
RDW: 15.6 % — ABNORMAL HIGH (ref 11.5–15.5)
WBC: 13 10*3/uL — ABNORMAL HIGH (ref 4.0–10.5)
nRBC: 0 % (ref 0.0–0.2)

## 2022-11-08 LAB — LIPASE, BLOOD: Lipase: 28 U/L (ref 11–51)

## 2022-11-08 LAB — COMPREHENSIVE METABOLIC PANEL
ALT: 14 U/L (ref 0–44)
AST: 18 U/L (ref 15–41)
Albumin: 4 g/dL (ref 3.5–5.0)
Alkaline Phosphatase: 76 U/L (ref 38–126)
Anion gap: 14 (ref 5–15)
BUN: 14 mg/dL (ref 6–20)
CO2: 22 mmol/L (ref 22–32)
Calcium: 9.9 mg/dL (ref 8.9–10.3)
Chloride: 104 mmol/L (ref 98–111)
Creatinine, Ser: 0.97 mg/dL (ref 0.44–1.00)
GFR, Estimated: >60 mL/min (ref >60)
Glucose, Bld: 145 mg/dL — ABNORMAL HIGH (ref 70–99)
Potassium: 3.8 mmol/L (ref 3.5–5.1)
Sodium: 140 mmol/L (ref 135–145)
Total Bilirubin: 0.5 mg/dL (ref 0.3–1.2)
Total Protein: 9.4 g/dL — ABNORMAL HIGH (ref 6.5–8.1)

## 2022-11-08 MED ORDER — HYDROMORPHONE HCL 1 MG/ML IJ SOLN
0.5000 mg | Freq: Once | INTRAMUSCULAR | Status: AC
  Administered 2022-11-08: 0.5 mg via INTRAVENOUS
  Filled 2022-11-08: qty 1

## 2022-11-08 MED ORDER — ONDANSETRON HCL 4 MG/2ML IJ SOLN: 4.0000 mg | Freq: Once | INTRAMUSCULAR | Status: DC

## 2022-11-08 MED ORDER — PROCHLORPERAZINE EDISYLATE 10 MG/2ML IJ SOLN
10.0000 mg | Freq: Once | INTRAMUSCULAR | Status: AC
  Administered 2022-11-08: 10 mg via INTRAVENOUS
  Filled 2022-11-08: qty 2

## 2022-11-08 MED ORDER — IOHEXOL 300 MG/ML  SOLN
100.0000 mL | Freq: Once | INTRAMUSCULAR | Status: AC | PRN
  Administered 2022-11-08: 100 mL via INTRAVENOUS

## 2022-11-08 MED ORDER — HYDROMORPHONE HCL 1 MG/ML IJ SOLN
1.0000 mg | Freq: Once | INTRAMUSCULAR | Status: AC
  Administered 2022-11-08: 1 mg via INTRAVENOUS
  Filled 2022-11-08: qty 1

## 2022-11-08 MED ORDER — ONDANSETRON HCL 4 MG/2ML IJ SOLN
4.0000 mg | Freq: Once | INTRAMUSCULAR | Status: AC
  Administered 2022-11-08: 4 mg via INTRAVENOUS
  Filled 2022-11-08: qty 2

## 2022-11-08 MED ORDER — SODIUM CHLORIDE 0.9 % IV BOLUS
1000.0000 mL | Freq: Once | INTRAVENOUS | Status: AC
  Administered 2022-11-08: 1000 mL via INTRAVENOUS

## 2022-11-08 NOTE — ED Notes (Signed)
Pt de sats while sleeping to 89%, upon awakening 94% RA

## 2022-11-08 NOTE — ED Triage Notes (Signed)
Pt bib ems from home for nausea for 12 hrs, denies abd pain and vomiting. Pt admin x3 doses 8mg  zofran at home, last dose at noon with no relief.

## 2022-11-08 NOTE — ED Provider Notes (Signed)
EMERGENCY DEPARTMENT AT [facility] Provider Note   CSN: [id number] Arrival date & time: 11/08/22  1924     History {Add pertinent medical, surgical, social history, OB history to HPI:1} Chief Complaint  Patient presents with   Nausea    Pt bib ems from home for nausea for 12 hrs, denies abd pain and vomiting. Pt admin x3 doses 8mg  zofran at home, last dose at noon with no relief.    Lindsay Little is a 57 y.o. female.  HPI     Home Medications Prior to Admission medications   Medication Sig Start Date End Date Taking? Authorizing Provider  acetaminophen (TYLENOL) 500 MG tablet Take 1,000 mg by mouth every 8 (eight) hours as needed for moderate pain.    [provider]  BELBUCA 450 MCG FILM Place 1 Film inside cheek every 12 (twelve) hours. 10/24/22 11/23/22  [provider]  celecoxib (CELEBREX) 100 MG capsule Take 100 mg by mouth 2 (two) times daily. 12/12/20   [provider]  Cholecalciferol (VITAMIN D3) 5000 units CAPS Take 5,000 Units by mouth at bedtime.     [provider]  Coenzyme Q10 (COQ10) 200 MG CAPS Take 200 mg by mouth at bedtime.    [provider]  docusate sodium (COLACE) 100 MG capsule Take 1 capsule (100 mg total) by mouth 2 (two) times daily. 10/31/22   [name], DO  DULoxetine (CYMBALTA) 60 MG capsule Take 120 mg by mouth daily.    [provider]  esomeprazole (NEXIUM) 40 MG capsule Take 1 capsule (40 mg total) by mouth 2 (two) times daily before a meal. 12/22/15   [name], MD  famotidine (PEPCID) 20 MG tablet Take 20 mg by mouth 2 (two) times daily. 08/28/22 02/24/23  [provider]  furosemide (LASIX) 20 MG tablet Take 20 mg by mouth daily as needed for edema.     [provider]  levETIRAcetam (KEPPRA) 250 MG tablet Take 500 mg by mouth 2 (two) times daily.    [provider]  levothyroxine (SYNTHROID) 50 MCG tablet Take 50 mcg by mouth every  morning. 02/07/22   [provider]  Melatonin 3 MG TABS Take 3-6 mg by mouth at bedtime as needed (sleep).    [provider]  morphine (MS CONTIN) 15 MG 12 hr tablet Take 15 mg by mouth every 12 (twelve) hours.    [provider]  Multiple Vitamin (MULTIVITAMIN WITH MINERALS) TABS tablet Take 2 tablets by mouth at bedtime.     [provider]  ondansetron (ZOFRAN) 4 MG tablet Take 1 tablet (4 mg total) by mouth every 6 (six) hours as needed for nausea. 07/20/22   Sherryll Burger, Pratik D, DO  oxybutynin (DITROPAN XL) 15 MG 24 hr tablet Take 15 mg by mouth at bedtime.    [provider]  polyethylene glycol (MIRALAX / GLYCOLAX) 17 g packet Take 17 g by mouth daily.    [provider]  pregabalin (LYRICA) 225 MG capsule Take 225 mg by mouth 2 (two) times daily. 02/07/22   [provider]  promethazine (PHENERGAN) 25 MG tablet TAKE ONE TABLET BY MOUTH EVERY 6 HOURS AS NEEDED FOR NAUSEA OR VOMITING Patient taking differently: Take 25 mg by mouth every 6 (six) hours as needed for nausea or vomiting. 07/04/20   Daiva Eves, Lisette Grinder, MD  rosuvastatin (CRESTOR) 5 MG tablet Take 5 mg by mouth at bedtime.    [provider]  triamcinolone lotion (KENALOG) 0.1 % Apply 1 application topically as needed (dry skin).    [provider]      Allergies    Caine-1 [lidocaine], Bupropion, Sulfa antibiotics, Pantoprazole, Adhesive [tape], Doxycycline, Flagyl [metronidazole], Iron, Metoprolol, Oxycodone, and Penicillins    Review of Systems   Review of Systems  Physical Exam Updated Vital Signs BP 126/87 (BP Location: Right Arm)   Pulse (!) 110   Temp 98 F (36.7 C) (Oral)   Resp 18   SpO2 96%  Physical Exam  ED Results / Procedures / Treatments   Labs (all labs ordered are listed, but only abnormal results are displayed) Labs Reviewed - No data to display  EKG None  Radiology No results found.  Procedures Procedures   {Document cardiac monitor, telemetry assessment procedure when appropriate:1}  Medications Ordered in ED Medications - No data to display  ED Course/ Medical Decision Making/ A&P   {   Click here for ABCD2, HEART and other calculatorsREFRESH Note before signing :1}                          Medical Decision Making  ***  {Document critical care time when appropriate:1} {Document review of labs and clinical decision tools ie heart score, Chads2Vasc2 etc:1}  {Document your independent review of radiology images, and any outside records:1} {Document your discussion with family members, caretakers, and with consultants:1} {Document social determinants of health affecting pt's care:1} {Document your decision making why or why not admission, treatments were needed:1} Final Clinical Impression(s) / ED Diagnoses Final diagnoses:  None    Rx / DC Orders ED Discharge Orders     None

## 2022-11-08 NOTE — ED Provider Notes (Incomplete)
[place] EMERGENCY DEPARTMENT AT [facility] Provider Note   CSN: [id number] Arrival date & time: 11/08/22  1924     History {Add pertinent medical, surgical, social history, OB history to HPI:1} Chief Complaint  Patient presents with  . Nausea    Pt bib ems from home for nausea for 12 hrs, denies abd pain and vomiting. Pt admin x3 doses 8mg  zofran at home, last dose at noon with no relief.    Lindsay Little is a 57 y.o. female history of rectal cancer status post colectomy with colostomy bag, hypertension, Crohn's disease, SBO presented to the ED after having 12 hours of nausea.  Patient that she has not been able to eat or drink for the past 12 hours and tried Zofran at home with no relief.  Patient was recently admitted for an ileus earlier this month and is concerned for the same diagnosis.  Patient denies any emesis.  Patient states she is not passing gas or bowel movements but able to urinate without complication.  Patient not currently on chemo or radiation.  Patient denies chest pain, shortness of breath, dysuria, changes in stool in her ostomy bag, fevers, vision changes, neck pain, headache  Home Medications Prior to Admission medications   Medication Sig Start Date End Date Taking? Authorizing Provider  acetaminophen (TYLENOL) 500 MG tablet Take 1,000 mg by mouth every 8 (eight) hours as needed for moderate pain.    [provider]  BELBUCA 450 MCG FILM Place 1 Film inside cheek every 12 (twelve) hours. 10/24/22 11/23/22  [provider]  celecoxib (CELEBREX) 100 MG capsule Take 100 mg by mouth 2 (two) times daily. 12/12/20   [provider]  Cholecalciferol (VITAMIN D3) 5000 units CAPS Take 5,000 Units by mouth at bedtime.     [provider]  Coenzyme Q10 (COQ10) 200 MG CAPS Take 200 mg by mouth at bedtime.    [provider]  docusate sodium (COLACE) 100 MG capsule Take 1 capsule (100 mg total) by mouth 2 (two) times  daily. 10/31/22   [name], DO  DULoxetine (CYMBALTA) 60 MG capsule Take 120 mg by mouth daily.    [provider]  esomeprazole (NEXIUM) 40 MG capsule Take 1 capsule (40 mg total) by mouth 2 (two) times daily before a meal. 12/22/15   [name], MD  famotidine (PEPCID) 20 MG tablet Take 20 mg by mouth 2 (two) times daily. 08/28/22 02/24/23  [provider]  furosemide (LASIX) 20 MG tablet Take 20 mg by mouth daily as needed for edema.     [provider]  levETIRAcetam (KEPPRA) 250 MG tablet Take 500 mg by mouth 2 (two) times daily.    [provider]  levothyroxine (SYNTHROID) 50 MCG tablet Take 50 mcg by mouth every morning. 02/07/22   [provider]  Melatonin 3 MG TABS Take 3-6 mg by mouth at bedtime as needed (sleep).    [provider]  morphine (MS CONTIN) 15 MG 12 hr tablet Take 15 mg by mouth every 12 (twelve) hours.    [provider]  Multiple Vitamin (MULTIVITAMIN WITH MINERALS) TABS tablet Take 2 tablets by mouth at bedtime.     [provider]  ondansetron (ZOFRAN) 4 MG tablet Take 1 tablet (4 mg total) by mouth every 6 (six) hours as needed for nausea. 07/20/22   Sherryll Burger, Pratik D, DO  oxybutynin (DITROPAN XL) 15 MG 24 hr tablet Take 15 mg by mouth at  bedtime.    [provider]  polyethylene glycol (MIRALAX / GLYCOLAX) 17 g packet Take 17 g by mouth daily.    [provider]  pregabalin (LYRICA) 225 MG capsule Take 225 mg by mouth 2 (two) times daily. 02/07/22   [provider]  promethazine (PHENERGAN) 25 MG tablet TAKE ONE TABLET BY MOUTH EVERY 6 HOURS AS NEEDED FOR NAUSEA OR VOMITING Patient taking differently: Take 25 mg by mouth every 6 (six) hours as needed for nausea or vomiting. 07/04/20   Daiva Eves, Lisette Grinder, MD  rosuvastatin (CRESTOR) 5 MG tablet Take 5 mg by mouth at bedtime.    [provider]  triamcinolone lotion (KENALOG) 0.1 % Apply 1 application topically as  needed (dry skin).    [provider]      Allergies    Caine-1 [lidocaine], Bupropion, Sulfa antibiotics, Pantoprazole, Adhesive [tape], Doxycycline, Flagyl [metronidazole], Iron, Metoprolol, Oxycodone, and Penicillins    Review of Systems   Review of Systems See HPI Physical Exam Updated Vital Signs BP 126/87 (BP Location: Right Arm)   Pulse (!) 110   Temp 98 F (36.7 C) (Oral)   Resp 18   SpO2 96%  Physical Exam Constitutional:      General: She is not in acute distress. Eyes:     Extraocular Movements: Extraocular movements intact.     Conjunctiva/sclera: Conjunctivae normal.     Pupils: Pupils are equal, round, and reactive to light.  Cardiovascular:     Rate and Rhythm: Regular rhythm. Tachycardia present.     Pulses: Normal pulses.     Heart sounds: Normal heart sounds.     Comments: 2+ bilateral radial pulses with slightly increased rate Pulmonary:     Effort: Pulmonary effort is normal. No respiratory distress.     Breath sounds: Normal breath sounds.  Abdominal:     Palpations: Abdomen is soft.     Tenderness: There is abdominal tenderness (Generalized). There is no guarding or rebound.     Comments: Ostomy bag noted with no bloody stool  Musculoskeletal:        General: Normal range of motion.  Skin:    General: Skin is warm and dry.     Capillary Refill: Capillary refill takes less than 2 seconds.     Comments: No overlying skin color changes  Neurological:     Mental Status: She is alert.     Comments: Sensation intact in all 4 limbs  Psychiatric:        Mood and Affect: Mood normal.     ED Results / Procedures / Treatments   Labs (all labs ordered are listed, but only abnormal results are displayed) Labs Reviewed - No data to display  EKG None  Radiology No results found.  Procedures Procedures  {Document cardiac monitor, telemetry assessment procedure when appropriate:1}  Medications Ordered in ED Medications - No data to  display  ED Course/ Medical Decision Making/ A&P   {   Click here for ABCD2, HEART and other calculatorsREFRESH Note before signing :1}                          Medical Decision Making Amount and/or Complexity of Data Reviewed Labs: ordered. Radiology: ordered.  Risk Prescription drug management.   [name] 56 y.o. presented today for nausea with abdominal pain. Working DDx that I considered at this time includes, but not limited to, ileus, gastroenteritis, colitis, small bowel obstruction,  appendicitis, cholecystitis, pancreatitis, nephrolithiasis, AAA, UTI, pyleonephritis, testicular torsion.  R/o DDx: gastroenteritis, colitis, small bowel obstruction, appendicitis, cholecystitis, pancreatitis, nephrolithiasis, AAA, UTI, pyleonephritis, testicular torsion: These are considered less likely due to history of present illness and physical exam findings.  Review of prior external notes: 1424 discharge summary  Unique Tests and My Interpretation:  CBC with differential: Leukocytosis 13.0 CMP: Unremarkable Lipase: Negative UA: *** EKG: Rate, rhythm, axis, intervals all examined and without medically relevant abnormality. ST segments without concerns for elevations CT Abd/Pelvis with contrast:    Discussion with Independent Historian: None  Discussion of Management of Tests: None  Risk: Medium: prescription drug management  Risk Stratification Score: None  Plan: Patient presented for nausea. On exam patient was in no acute distress and stable vitals.  Patient's heart rate was slightly elevated at 110 however this is most likely due to patient's abdominal pain and nausea.  Patient had generalized tenderness but no peritoneal signs noted on exam.  Patient was recently admitted for an ileus earlier this month and is concerned for the same diagnosis.  Patient denies any passing of gas or bowel movements today and been unable to tolerate food or fluids.  Patient will be given  fluids along with Zofran and Dilaudid for symptom management as patient appears uncomfortable.  Labs did show leukocytosis of 13.0 however patient is not endorsing any infectious symptoms and with a negative CT scan I have low suspicion for any infectious etiology.  on recheck patient stated that the Dilaudid did take the pain away for a little bit but that she was still nauseous.  Upon chart review patient has had Compazine in the past and since patient has failed Zofran twice Compazine will be tried.  I spoke to the patient with CT not having any acute findings and that she is able tolerate fluids she may be discharged with GI follow-up.  Patient has not had any episodes of emesis during ED stay.  Patient verbalized agreement to this plan.  Patient is currently at baseline for her pain.  Patient was signed out to Riki Sheer, PA-C.  The plan moving forward is to see if patient can tolerate fluids after receiving Compazine.  If patient is able to tolerate fluids she may be discharged with GI follow-up.  If patient is unable to tolerate fluids consider admission.  Patient was given return precautions.patient stable for discharge at this time.  Patient verbalized understanding of plan.   {Document critical care time when appropriate:1} {Document review of labs and clinical decision tools ie heart score, Chads2Vasc2 etc:1}  {Document your independent review of radiology images, and any outside records:1} {Document your discussion with family members, caretakers, and with consultants:1} {Document social determinants of health affecting pt's care:1} {Document your decision making why or why not admission, treatments were needed:1} Final Clinical Impression(s) / ED Diagnoses Final diagnoses:  None    Rx / DC Orders ED Discharge Orders     None

## 2022-11-09 NOTE — ED Provider Notes (Signed)
Accepted handoff at shift change from [facility]. Please see prior provider note for more detail.   Briefly: Patient is 57 y.o. with history of rectal cancer status post colectomy with colostomy bag, hypertension Crohn's disease and SBO presented to the ED for 12 hours of nausea.  DDX: concern for gastroenteritis, colitis, small bowel obstruction, appendicitis, cholecystitis, pancreatitis, nephrolithiasis, AAA, UTI, pyleonephritis, testicular torsion   Plan: Patient just given Compazine.  Advised to reassess patient's nausea.  If improved can be discharged home with follow-up with her PCP.     Physical Exam  BP 121/80   Pulse (!) 103   Temp 98.2 F (36.8 C) (Oral)   Resp 13   SpO2 94%   Physical Exam  Procedures  Procedures  ED Course / MDM    Medical Decision Making Amount and/or Complexity of Data Reviewed Labs: ordered. Radiology: ordered.  Risk Prescription drug management.   After receiving Compazine, reassess patient's nausea and she stated that it was much better.  Fluid challenge without complication.  Advised to follow-up with her PCP.  Started with stable vitals.       Gareth Eagle, PA-C 11/09/22 0128    Nira Conn, MD 11/09/22 [id number]

## 2022-11-09 NOTE — Discharge Instructions (Signed)
Please follow-up with your GI specialist regarding recent symptoms and ER visit.  Please continue to use the Zofran as prescribed and taken food and fluids as tolerated.  If symptoms change or worsen please return to ER.

## 2022-12-03 DIAGNOSIS — R42 Dizziness and giddiness: Secondary | ICD-10-CM | POA: Diagnosis not present

## 2022-12-03 DIAGNOSIS — G894 Chronic pain syndrome: Secondary | ICD-10-CM | POA: Diagnosis not present

## 2022-12-03 DIAGNOSIS — R634 Abnormal weight loss: Secondary | ICD-10-CM | POA: Diagnosis not present

## 2022-12-03 DIAGNOSIS — F3341 Major depressive disorder, recurrent, in partial remission: Secondary | ICD-10-CM | POA: Diagnosis not present

## 2022-12-03 DIAGNOSIS — G620 Drug-induced polyneuropathy: Secondary | ICD-10-CM | POA: Diagnosis not present

## 2022-12-03 DIAGNOSIS — J069 Acute upper respiratory infection, unspecified: Secondary | ICD-10-CM | POA: Diagnosis not present

## 2022-12-03 DIAGNOSIS — I10 Essential (primary) hypertension: Secondary | ICD-10-CM | POA: Diagnosis not present

## 2022-12-04 DIAGNOSIS — C20 Malignant neoplasm of rectum: Secondary | ICD-10-CM | POA: Diagnosis not present

## 2022-12-04 DIAGNOSIS — M961 Postlaminectomy syndrome, not elsewhere classified: Secondary | ICD-10-CM | POA: Diagnosis not present

## 2022-12-04 DIAGNOSIS — M5416 Radiculopathy, lumbar region: Secondary | ICD-10-CM | POA: Diagnosis not present

## 2022-12-04 DIAGNOSIS — B0229 Other postherpetic nervous system involvement: Secondary | ICD-10-CM | POA: Diagnosis not present

## 2022-12-20 DIAGNOSIS — D1809 Hemangioma of other sites: Secondary | ICD-10-CM | POA: Diagnosis not present

## 2022-12-20 DIAGNOSIS — N1330 Unspecified hydronephrosis: Secondary | ICD-10-CM | POA: Diagnosis not present

## 2022-12-20 DIAGNOSIS — N3945 Continuous leakage: Secondary | ICD-10-CM | POA: Diagnosis not present

## 2022-12-20 DIAGNOSIS — Z933 Colostomy status: Secondary | ICD-10-CM | POA: Diagnosis not present

## 2022-12-20 DIAGNOSIS — I10 Essential (primary) hypertension: Secondary | ICD-10-CM | POA: Diagnosis not present

## 2022-12-20 DIAGNOSIS — Z85048 Personal history of other malignant neoplasm of rectum, rectosigmoid junction, and anus: Secondary | ICD-10-CM | POA: Diagnosis not present

## 2022-12-20 DIAGNOSIS — Z79899 Other long term (current) drug therapy: Secondary | ICD-10-CM | POA: Diagnosis not present

## 2022-12-20 DIAGNOSIS — Z6841 Body Mass Index (BMI) 40.0 and over, adult: Secondary | ICD-10-CM | POA: Diagnosis not present

## 2022-12-20 DIAGNOSIS — N131 Hydronephrosis with ureteral stricture, not elsewhere classified: Secondary | ICD-10-CM | POA: Diagnosis not present

## 2022-12-25 ENCOUNTER — Encounter: Payer: Self-pay | Admitting: Hematology

## 2023-01-01 DIAGNOSIS — M47816 Spondylosis without myelopathy or radiculopathy, lumbar region: Secondary | ICD-10-CM | POA: Diagnosis not present

## 2023-01-01 DIAGNOSIS — M961 Postlaminectomy syndrome, not elsewhere classified: Secondary | ICD-10-CM | POA: Diagnosis not present

## 2023-01-01 DIAGNOSIS — C20 Malignant neoplasm of rectum: Secondary | ICD-10-CM | POA: Diagnosis not present

## 2023-01-01 DIAGNOSIS — M5416 Radiculopathy, lumbar region: Secondary | ICD-10-CM | POA: Diagnosis not present

## 2023-01-02 DIAGNOSIS — M47816 Spondylosis without myelopathy or radiculopathy, lumbar region: Secondary | ICD-10-CM | POA: Diagnosis not present

## 2023-01-03 ENCOUNTER — Encounter: Payer: Self-pay | Admitting: Hematology

## 2023-01-10 ENCOUNTER — Ambulatory Visit: Payer: Medicare HMO | Admitting: Podiatry

## 2023-01-30 DIAGNOSIS — M5416 Radiculopathy, lumbar region: Secondary | ICD-10-CM | POA: Diagnosis not present

## 2023-01-31 ENCOUNTER — Ambulatory Visit: Payer: Medicare HMO | Admitting: Podiatry

## 2023-02-14 ENCOUNTER — Encounter (HOSPITAL_COMMUNITY): Payer: Self-pay

## 2023-02-14 ENCOUNTER — Inpatient Hospital Stay (HOSPITAL_COMMUNITY)
Admission: EM | Admit: 2023-02-14 | Discharge: 2023-02-18 | DRG: 660 | Disposition: A | Discharge status: Home / Self Care | Payer: Medicare HMO | Attending: Internal Medicine | Admitting: Internal Medicine

## 2023-02-14 ENCOUNTER — Other Ambulatory Visit: Payer: Self-pay

## 2023-02-14 DIAGNOSIS — E039 Hypothyroidism, unspecified: Secondary | ICD-10-CM | POA: Diagnosis present

## 2023-02-14 DIAGNOSIS — R1031 Right lower quadrant pain: Secondary | ICD-10-CM | POA: Diagnosis not present

## 2023-02-14 DIAGNOSIS — G894 Chronic pain syndrome: Secondary | ICD-10-CM | POA: Diagnosis present

## 2023-02-14 DIAGNOSIS — G620 Drug-induced polyneuropathy: Secondary | ICD-10-CM | POA: Diagnosis present

## 2023-02-14 DIAGNOSIS — T451X5A Adverse effect of antineoplastic and immunosuppressive drugs, initial encounter: Secondary | ICD-10-CM | POA: Diagnosis present

## 2023-02-14 DIAGNOSIS — Z882 Allergy status to sulfonamides status: Secondary | ICD-10-CM

## 2023-02-14 DIAGNOSIS — Z91048 Other nonmedicinal substance allergy status: Secondary | ICD-10-CM

## 2023-02-14 DIAGNOSIS — N131 Hydronephrosis with ureteral stricture, not elsewhere classified: Principal | ICD-10-CM | POA: Diagnosis present

## 2023-02-14 DIAGNOSIS — Z800 Family history of malignant neoplasm of digestive organs: Secondary | ICD-10-CM | POA: Diagnosis not present

## 2023-02-14 DIAGNOSIS — T83122A Displacement of urinary stent, initial encounter: Secondary | ICD-10-CM | POA: Diagnosis present

## 2023-02-14 DIAGNOSIS — Z9221 Personal history of antineoplastic chemotherapy: Secondary | ICD-10-CM

## 2023-02-14 DIAGNOSIS — G40909 Epilepsy, unspecified, not intractable, without status epilepticus: Secondary | ICD-10-CM | POA: Diagnosis present

## 2023-02-14 DIAGNOSIS — Z885 Allergy status to narcotic agent status: Secondary | ICD-10-CM

## 2023-02-14 DIAGNOSIS — R110 Nausea: Secondary | ICD-10-CM | POA: Diagnosis present

## 2023-02-14 DIAGNOSIS — E7800 Pure hypercholesterolemia, unspecified: Secondary | ICD-10-CM | POA: Diagnosis present

## 2023-02-14 DIAGNOSIS — Z881 Allergy status to other antibiotic agents status: Secondary | ICD-10-CM

## 2023-02-14 DIAGNOSIS — Z823 Family history of stroke: Secondary | ICD-10-CM

## 2023-02-14 DIAGNOSIS — N390 Urinary tract infection, site not specified: Principal | ICD-10-CM

## 2023-02-14 DIAGNOSIS — Z8249 Family history of ischemic heart disease and other diseases of the circulatory system: Secondary | ICD-10-CM

## 2023-02-14 DIAGNOSIS — F419 Anxiety disorder, unspecified: Secondary | ICD-10-CM | POA: Diagnosis present

## 2023-02-14 DIAGNOSIS — F32A Depression, unspecified: Secondary | ICD-10-CM | POA: Diagnosis present

## 2023-02-14 DIAGNOSIS — Z6841 Body Mass Index (BMI) 40.0 and over, adult: Secondary | ICD-10-CM

## 2023-02-14 DIAGNOSIS — Z880 Allergy status to penicillin: Secondary | ICD-10-CM

## 2023-02-14 DIAGNOSIS — Z808 Family history of malignant neoplasm of other organs or systems: Secondary | ICD-10-CM | POA: Diagnosis not present

## 2023-02-14 DIAGNOSIS — Z923 Personal history of irradiation: Secondary | ICD-10-CM

## 2023-02-14 DIAGNOSIS — Z87891 Personal history of nicotine dependence: Secondary | ICD-10-CM

## 2023-02-14 DIAGNOSIS — Z90721 Acquired absence of ovaries, unilateral: Secondary | ICD-10-CM

## 2023-02-14 DIAGNOSIS — I10 Essential (primary) hypertension: Secondary | ICD-10-CM | POA: Diagnosis present

## 2023-02-14 DIAGNOSIS — Z833 Family history of diabetes mellitus: Secondary | ICD-10-CM

## 2023-02-14 DIAGNOSIS — K5900 Constipation, unspecified: Secondary | ICD-10-CM | POA: Diagnosis present

## 2023-02-14 DIAGNOSIS — Z79890 Hormone replacement therapy (postmenopausal): Secondary | ICD-10-CM | POA: Diagnosis not present

## 2023-02-14 DIAGNOSIS — Y732 Prosthetic and other implants, materials and accessory gastroenterology and urology devices associated with adverse incidents: Secondary | ICD-10-CM | POA: Diagnosis present

## 2023-02-14 DIAGNOSIS — Z90710 Acquired absence of both cervix and uterus: Secondary | ICD-10-CM

## 2023-02-14 DIAGNOSIS — M1990 Unspecified osteoarthritis, unspecified site: Secondary | ICD-10-CM | POA: Diagnosis present

## 2023-02-14 DIAGNOSIS — Z83438 Family history of other disorder of lipoprotein metabolism and other lipidemia: Secondary | ICD-10-CM

## 2023-02-14 DIAGNOSIS — N132 Hydronephrosis with renal and ureteral calculous obstruction: Secondary | ICD-10-CM | POA: Diagnosis not present

## 2023-02-14 DIAGNOSIS — R1012 Left upper quadrant pain: Secondary | ICD-10-CM | POA: Diagnosis not present

## 2023-02-14 DIAGNOSIS — C785 Secondary malignant neoplasm of large intestine and rectum: Secondary | ICD-10-CM | POA: Diagnosis not present

## 2023-02-14 DIAGNOSIS — Z803 Family history of malignant neoplasm of breast: Secondary | ICD-10-CM | POA: Diagnosis not present

## 2023-02-14 DIAGNOSIS — K219 Gastro-esophageal reflux disease without esophagitis: Secondary | ICD-10-CM | POA: Diagnosis present

## 2023-02-14 DIAGNOSIS — N1339 Other hydronephrosis: Secondary | ICD-10-CM

## 2023-02-14 DIAGNOSIS — Z933 Colostomy status: Secondary | ICD-10-CM

## 2023-02-14 DIAGNOSIS — N1330 Unspecified hydronephrosis: Secondary | ICD-10-CM | POA: Diagnosis not present

## 2023-02-14 DIAGNOSIS — Z85048 Personal history of other malignant neoplasm of rectum, rectosigmoid junction, and anus: Secondary | ICD-10-CM

## 2023-02-14 DIAGNOSIS — I129 Hypertensive chronic kidney disease with stage 1 through stage 4 chronic kidney disease, or unspecified chronic kidney disease: Secondary | ICD-10-CM | POA: Diagnosis not present

## 2023-02-14 DIAGNOSIS — N179 Acute kidney failure, unspecified: Secondary | ICD-10-CM | POA: Diagnosis present

## 2023-02-14 DIAGNOSIS — Z79899 Other long term (current) drug therapy: Secondary | ICD-10-CM

## 2023-02-14 DIAGNOSIS — N189 Chronic kidney disease, unspecified: Secondary | ICD-10-CM | POA: Diagnosis not present

## 2023-02-14 DIAGNOSIS — Z888 Allergy status to other drugs, medicaments and biological substances status: Secondary | ICD-10-CM

## 2023-02-14 LAB — URINALYSIS, ROUTINE W REFLEX MICROSCOPIC
Bilirubin Urine: NEGATIVE
Glucose, UA: NEGATIVE mg/dL
Ketones, ur: NEGATIVE mg/dL
Nitrite: NEGATIVE
Protein, ur: >=300 mg/dL — AB
RBC / HPF: >50 RBC/hpf (ref 0–5)
Specific Gravity, Urine: 1.018 (ref 1.005–1.030)
pH: 5 (ref 5.0–8.0)

## 2023-02-14 LAB — CBC
HCT: 38.8 % (ref 36.0–46.0)
Hemoglobin: 12 g/dL (ref 12.0–15.0)
MCH: 26.2 pg (ref 26.0–34.0)
MCHC: 30.9 g/dL (ref 30.0–36.0)
MCV: 84.7 fL (ref 80.0–100.0)
Platelets: 341 10*3/uL (ref 150–400)
RBC: 4.58 MIL/uL (ref 3.87–5.11)
RDW: 15.2 % (ref 11.5–15.5)
WBC: 9.8 10*3/uL (ref 4.0–10.5)
nRBC: 0 % (ref 0.0–0.2)

## 2023-02-14 LAB — COMPREHENSIVE METABOLIC PANEL
ALT: 13 U/L (ref 0–44)
AST: 13 U/L — ABNORMAL LOW (ref 15–41)
Albumin: 3.7 g/dL (ref 3.5–5.0)
Alkaline Phosphatase: 73 U/L (ref 38–126)
Anion gap: 10 (ref 5–15)
BUN: 21 mg/dL — ABNORMAL HIGH (ref 6–20)
CO2: 25 mmol/L (ref 22–32)
Calcium: 9.4 mg/dL (ref 8.9–10.3)
Chloride: 101 mmol/L (ref 98–111)
Creatinine, Ser: 1.84 mg/dL — ABNORMAL HIGH (ref 0.44–1.00)
GFR, Estimated: 32 mL/min — ABNORMAL LOW (ref >60)
Glucose, Bld: 133 mg/dL — ABNORMAL HIGH (ref 70–99)
Potassium: 3.7 mmol/L (ref 3.5–5.1)
Sodium: 136 mmol/L (ref 135–145)
Total Bilirubin: 0.3 mg/dL (ref 0.3–1.2)
Total Protein: 8.2 g/dL — ABNORMAL HIGH (ref 6.5–8.1)

## 2023-02-14 LAB — LIPASE, BLOOD: Lipase: 28 U/L (ref 11–51)

## 2023-02-14 NOTE — ED Triage Notes (Signed)
Pt complaining of abdominal pain in the abdomen. Does have an colostomy which she said has had no output for the last 6 days.  Sharp pain in the upper left abdomen and pain in the lower rt abdomen that radiates to the back and down the leg.

## 2023-02-15 ENCOUNTER — Emergency Department (HOSPITAL_COMMUNITY): Payer: Medicare HMO

## 2023-02-15 DIAGNOSIS — Z803 Family history of malignant neoplasm of breast: Secondary | ICD-10-CM | POA: Diagnosis not present

## 2023-02-15 DIAGNOSIS — N179 Acute kidney failure, unspecified: Secondary | ICD-10-CM | POA: Diagnosis present

## 2023-02-15 DIAGNOSIS — Z800 Family history of malignant neoplasm of digestive organs: Secondary | ICD-10-CM | POA: Diagnosis not present

## 2023-02-15 DIAGNOSIS — N189 Chronic kidney disease, unspecified: Secondary | ICD-10-CM | POA: Diagnosis not present

## 2023-02-15 DIAGNOSIS — Z923 Personal history of irradiation: Secondary | ICD-10-CM | POA: Diagnosis not present

## 2023-02-15 DIAGNOSIS — I10 Essential (primary) hypertension: Secondary | ICD-10-CM | POA: Diagnosis present

## 2023-02-15 DIAGNOSIS — G894 Chronic pain syndrome: Secondary | ICD-10-CM | POA: Diagnosis present

## 2023-02-15 DIAGNOSIS — Z79890 Hormone replacement therapy (postmenopausal): Secondary | ICD-10-CM | POA: Diagnosis not present

## 2023-02-15 DIAGNOSIS — K5900 Constipation, unspecified: Secondary | ICD-10-CM | POA: Diagnosis present

## 2023-02-15 DIAGNOSIS — Z823 Family history of stroke: Secondary | ICD-10-CM | POA: Diagnosis not present

## 2023-02-15 DIAGNOSIS — Y732 Prosthetic and other implants, materials and accessory gastroenterology and urology devices associated with adverse incidents: Secondary | ICD-10-CM | POA: Diagnosis present

## 2023-02-15 DIAGNOSIS — I129 Hypertensive chronic kidney disease with stage 1 through stage 4 chronic kidney disease, or unspecified chronic kidney disease: Secondary | ICD-10-CM | POA: Diagnosis not present

## 2023-02-15 DIAGNOSIS — N131 Hydronephrosis with ureteral stricture, not elsewhere classified: Secondary | ICD-10-CM | POA: Diagnosis present

## 2023-02-15 DIAGNOSIS — Z808 Family history of malignant neoplasm of other organs or systems: Secondary | ICD-10-CM | POA: Diagnosis not present

## 2023-02-15 DIAGNOSIS — G620 Drug-induced polyneuropathy: Secondary | ICD-10-CM | POA: Diagnosis present

## 2023-02-15 DIAGNOSIS — Z833 Family history of diabetes mellitus: Secondary | ICD-10-CM | POA: Diagnosis not present

## 2023-02-15 DIAGNOSIS — K219 Gastro-esophageal reflux disease without esophagitis: Secondary | ICD-10-CM | POA: Diagnosis present

## 2023-02-15 DIAGNOSIS — Z87891 Personal history of nicotine dependence: Secondary | ICD-10-CM | POA: Diagnosis not present

## 2023-02-15 DIAGNOSIS — G40909 Epilepsy, unspecified, not intractable, without status epilepticus: Secondary | ICD-10-CM | POA: Diagnosis present

## 2023-02-15 DIAGNOSIS — E7800 Pure hypercholesterolemia, unspecified: Secondary | ICD-10-CM | POA: Diagnosis present

## 2023-02-15 DIAGNOSIS — R1012 Left upper quadrant pain: Secondary | ICD-10-CM | POA: Diagnosis not present

## 2023-02-15 DIAGNOSIS — Z8249 Family history of ischemic heart disease and other diseases of the circulatory system: Secondary | ICD-10-CM | POA: Diagnosis not present

## 2023-02-15 DIAGNOSIS — Z6841 Body Mass Index (BMI) 40.0 and over, adult: Secondary | ICD-10-CM | POA: Diagnosis not present

## 2023-02-15 DIAGNOSIS — T83122A Displacement of urinary stent, initial encounter: Secondary | ICD-10-CM | POA: Diagnosis present

## 2023-02-15 DIAGNOSIS — F32A Depression, unspecified: Secondary | ICD-10-CM | POA: Diagnosis present

## 2023-02-15 DIAGNOSIS — E039 Hypothyroidism, unspecified: Secondary | ICD-10-CM | POA: Diagnosis present

## 2023-02-15 DIAGNOSIS — R1031 Right lower quadrant pain: Secondary | ICD-10-CM | POA: Diagnosis not present

## 2023-02-15 DIAGNOSIS — Z933 Colostomy status: Secondary | ICD-10-CM | POA: Diagnosis not present

## 2023-02-15 DIAGNOSIS — N1330 Unspecified hydronephrosis: Secondary | ICD-10-CM | POA: Diagnosis not present

## 2023-02-15 MED ORDER — TRAZODONE HCL 50 MG PO TABS
25.0000 mg | ORAL_TABLET | Freq: Every evening | ORAL | Status: DC | PRN
  Filled 2023-02-15: qty 1

## 2023-02-15 MED ORDER — LEVETIRACETAM 500 MG PO TABS
500.0000 mg | ORAL_TABLET | Freq: Two times a day (BID) | ORAL | Status: DC
  Administered 2023-02-15 – 2023-02-18 (×7): 500 mg via ORAL
  Filled 2023-02-15 (×7): qty 1

## 2023-02-15 MED ORDER — LEVOTHYROXINE SODIUM 50 MCG PO TABS
50.0000 ug | ORAL_TABLET | Freq: Every morning | ORAL | Status: DC
  Administered 2023-02-16 – 2023-02-18 (×3): 50 ug via ORAL
  Filled 2023-02-15 (×3): qty 1

## 2023-02-15 MED ORDER — ACETAMINOPHEN 325 MG PO TABS
650.0000 mg | ORAL_TABLET | Freq: Four times a day (QID) | ORAL | Status: DC | PRN
  Administered 2023-02-16: 650 mg via ORAL
  Filled 2023-02-15: qty 2

## 2023-02-15 MED ORDER — FAMOTIDINE 20 MG PO TABS
20.0000 mg | ORAL_TABLET | Freq: Two times a day (BID) | ORAL | Status: DC
  Administered 2023-02-15 – 2023-02-18 (×7): 20 mg via ORAL
  Filled 2023-02-15 (×7): qty 1

## 2023-02-15 MED ORDER — MORPHINE SULFATE ER 15 MG PO TBCR: 15.0000 mg | EXTENDED_RELEASE_TABLET | Freq: Two times a day (BID) | ORAL | Status: DC

## 2023-02-15 MED ORDER — POLYETHYLENE GLYCOL 3350 17 G PO PACK
17.0000 g | PACK | Freq: Every day | ORAL | Status: DC
  Administered 2023-02-15 – 2023-02-16 (×2): 17 g via ORAL
  Filled 2023-02-15 (×2): qty 1

## 2023-02-15 MED ORDER — DULOXETINE HCL 60 MG PO CPEP
120.0000 mg | ORAL_CAPSULE | Freq: Every day | ORAL | Status: DC
  Administered 2023-02-15 – 2023-02-18 (×4): 120 mg via ORAL
  Filled 2023-02-15 (×2): qty 2
  Filled 2023-02-15: qty 4
  Filled 2023-02-15: qty 2

## 2023-02-15 MED ORDER — ALBUTEROL SULFATE (2.5 MG/3ML) 0.083% IN NEBU: 2.5000 mg | INHALATION_SOLUTION | RESPIRATORY_TRACT | Status: DC | PRN

## 2023-02-15 MED ORDER — OXYBUTYNIN CHLORIDE ER 5 MG PO TB24
5.0000 mg | ORAL_TABLET | Freq: Every day | ORAL | Status: DC
  Administered 2023-02-15 – 2023-02-17 (×3): 5 mg via ORAL
  Filled 2023-02-15 (×3): qty 1

## 2023-02-15 MED ORDER — MORPHINE SULFATE (PF) 4 MG/ML IV SOLN
8.0000 mg | Freq: Once | INTRAVENOUS | Status: AC
  Administered 2023-02-15: 8 mg via INTRAVENOUS
  Filled 2023-02-15: qty 2

## 2023-02-15 MED ORDER — PREGABALIN 75 MG PO CAPS
225.0000 mg | ORAL_CAPSULE | Freq: Two times a day (BID) | ORAL | Status: DC
  Administered 2023-02-15 – 2023-02-18 (×7): 225 mg via ORAL
  Filled 2023-02-15: qty 1
  Filled 2023-02-15 (×6): qty 3

## 2023-02-15 MED ORDER — ACETAMINOPHEN 650 MG RE SUPP: 650.0000 mg | Freq: Four times a day (QID) | RECTAL | Status: DC | PRN

## 2023-02-15 MED ORDER — DOCUSATE SODIUM 100 MG PO CAPS
100.0000 mg | ORAL_CAPSULE | Freq: Two times a day (BID) | ORAL | Status: DC
  Administered 2023-02-15 – 2023-02-16 (×3): 100 mg via ORAL
  Filled 2023-02-15 (×3): qty 1

## 2023-02-15 MED ORDER — LACTATED RINGERS IV SOLN: INTRAVENOUS | Status: DC

## 2023-02-15 MED ORDER — OXYBUTYNIN CHLORIDE ER 15 MG PO TB24
15.0000 mg | ORAL_TABLET | Freq: Every day | ORAL | Status: DC
  Filled 2023-02-15: qty 1

## 2023-02-15 MED ORDER — HYDROMORPHONE HCL 1 MG/ML IJ SOLN
0.5000 mg | INTRAMUSCULAR | Status: DC | PRN
  Administered 2023-02-15 – 2023-02-18 (×29): 1 mg via INTRAVENOUS
  Filled 2023-02-15 (×31): qty 1

## 2023-02-15 MED ORDER — OXYBUTYNIN CHLORIDE ER 10 MG PO TB24
10.0000 mg | ORAL_TABLET | Freq: Every day | ORAL | Status: DC
  Administered 2023-02-15 – 2023-02-17 (×3): 10 mg via ORAL
  Filled 2023-02-15 (×3): qty 1

## 2023-02-15 MED ORDER — ONDANSETRON HCL 4 MG PO TABS: 4.0000 mg | ORAL_TABLET | Freq: Four times a day (QID) | ORAL | Status: DC | PRN

## 2023-02-15 MED ORDER — SODIUM CHLORIDE 0.9 % IV SOLN
1.0000 g | INTRAVENOUS | Status: DC
  Administered 2023-02-15 – 2023-02-17 (×3): 1 g via INTRAVENOUS
  Filled 2023-02-15 (×2): qty 10

## 2023-02-15 MED ORDER — HEPARIN SODIUM (PORCINE) 5000 UNIT/ML IJ SOLN
5000.0000 [IU] | Freq: Three times a day (TID) | INTRAMUSCULAR | Status: DC
  Administered 2023-02-15 – 2023-02-18 (×10): 5000 [IU] via SUBCUTANEOUS
  Filled 2023-02-15 (×10): qty 1

## 2023-02-15 MED ORDER — LACTATED RINGERS IV BOLUS
2000.0000 mL | Freq: Once | INTRAVENOUS | Status: AC
  Administered 2023-02-15: 2000 mL via INTRAVENOUS

## 2023-02-15 MED ORDER — ROSUVASTATIN CALCIUM 5 MG PO TABS
5.0000 mg | ORAL_TABLET | Freq: Every day | ORAL | Status: DC
  Administered 2023-02-15 – 2023-02-17 (×3): 5 mg via ORAL
  Filled 2023-02-15 (×3): qty 1

## 2023-02-15 MED ORDER — ONDANSETRON HCL 4 MG/2ML IJ SOLN
4.0000 mg | Freq: Four times a day (QID) | INTRAMUSCULAR | Status: DC | PRN
  Administered 2023-02-17 – 2023-02-18 (×2): 4 mg via INTRAVENOUS
  Filled 2023-02-15 (×2): qty 2

## 2023-02-15 NOTE — ED Provider Notes (Signed)
[facility] Provider Note   CSN: [id number] Arrival date & time: 02/14/23  1849     History  Chief Complaint  Patient presents with   Abdominal Pain    Lindsay Little is a 57 y.o. female.  57 year old female presents with 1 week of left-sided flank pain.  Patient is also noted decreased output from her ostomy bag.  Does have a history of rectal CA.  Also has a history of ureteral strictures.  Review of outside chart shows that patient had ureteral stents placed approximately 2 months ago.  She states she currently has a trickle of urine.  Denies any emesis but has had some myalgias.  Has been using OTCs without relief for her decreased output of stool.  Notes area on her back.  Also recent hospital admission for bowel obstruction.      Home Medications Prior to Admission medications   Medication Sig Start Date End Date Taking? Authorizing Provider  acetaminophen (TYLENOL) 500 MG tablet Take 1,000 mg by mouth every 8 (eight) hours as needed for moderate pain.    [provider]  celecoxib (CELEBREX) 100 MG capsule Take 100 mg by mouth 2 (two) times daily. 12/12/20   [provider]  Cholecalciferol (VITAMIN D3) 5000 units CAPS Take 5,000 Units by mouth at bedtime.     [provider]  Coenzyme Q10 (COQ10) 200 MG CAPS Take 200 mg by mouth at bedtime.    [provider]  docusate sodium (COLACE) 100 MG capsule Take 1 capsule (100 mg total) by mouth 2 (two) times daily. 10/31/22   [name], DO  DULoxetine (CYMBALTA) 60 MG capsule Take 120 mg by mouth daily.    [provider]  esomeprazole (NEXIUM) 40 MG capsule Take 1 capsule (40 mg total) by mouth 2 (two) times daily before a meal. 12/22/15   [name], MD  famotidine (PEPCID) 20 MG tablet Take 20 mg by mouth 2 (two) times daily. 08/28/22 02/24/23  [provider]  furosemide (LASIX) 20 MG tablet Take 20 mg by mouth daily as needed  for edema.     [provider]  levETIRAcetam (KEPPRA) 250 MG tablet Take 500 mg by mouth 2 (two) times daily.    [provider]  levothyroxine (SYNTHROID) 50 MCG tablet Take 50 mcg by mouth every morning. 02/07/22   [provider]  Melatonin 3 MG TABS Take 3-6 mg by mouth at bedtime as needed (sleep).    [provider]  morphine (MS CONTIN) 15 MG 12 hr tablet Take 15 mg by mouth every 12 (twelve) hours.    [provider]  Multiple Vitamin (MULTIVITAMIN WITH MINERALS) TABS tablet Take 2 tablets by mouth at bedtime.     [provider]  ondansetron (ZOFRAN) 4 MG tablet Take 1 tablet (4 mg total) by mouth every 6 (six) hours as needed for nausea. 07/20/22   Sherryll Burger, Pratik D, DO  oxybutynin (DITROPAN XL) 15 MG 24 hr tablet Take 15 mg by mouth at bedtime.    [provider]  polyethylene glycol (MIRALAX / GLYCOLAX) 17 g packet Take 17 g by mouth daily.    [provider]  pregabalin (LYRICA) 225 MG capsule Take 225 mg by mouth 2 (two) times daily. 02/07/22   [provider]  promethazine (PHENERGAN) 25 MG tablet TAKE ONE TABLET BY MOUTH EVERY 6 HOURS AS NEEDED FOR NAUSEA OR VOMITING Patient taking differently: Take 25 mg by  mouth every 6 (six) hours as needed for nausea or vomiting. 07/04/20   Daiva Eves, Lisette Grinder, MD  rosuvastatin (CRESTOR) 5 MG tablet Take 5 mg by mouth at bedtime.    [provider]  triamcinolone lotion (KENALOG) 0.1 % Apply 1 application topically as needed (dry skin).    [provider]      Allergies    Caine-1 [lidocaine], Bupropion, Sulfa antibiotics, Pantoprazole, Adhesive [tape], Doxycycline, Flagyl [metronidazole], Iron, Metoprolol, Oxycodone, and Penicillins    Review of Systems   Review of Systems  All other systems reviewed and are negative.   Physical Exam Updated Vital Signs BP (!) 141/82 (BP Location: Right Arm)   Pulse (!) 103   Temp 97.9 F (36.6 C) (Oral)    Resp 19   Ht 1.803 m (5\' 11" )   Wt 132.9 kg   SpO2 100%   BMI 40.87 kg/m  Physical Exam Vitals and nursing note reviewed.  Constitutional:      General: She is not in acute distress.    Appearance: Normal appearance. She is well-developed. She is not toxic-appearing.  HENT:     Head: Normocephalic and atraumatic.  Eyes:     General: Lids are normal.     Conjunctiva/sclera: Conjunctivae normal.     Pupils: Pupils are equal, round, and reactive to light.  Neck:     Thyroid: No thyroid mass.     Trachea: No tracheal deviation.  Cardiovascular:     Rate and Rhythm: Normal rate and regular rhythm.     Heart sounds: Normal heart sounds. No murmur heard.    No gallop.  Pulmonary:     Effort: Pulmonary effort is normal. No respiratory distress.     Breath sounds: Normal breath sounds. No stridor. No decreased breath sounds, wheezing, rhonchi or rales.  Abdominal:     General: There is no distension.     Palpations: Abdomen is soft.     Tenderness: There is abdominal tenderness. There is no guarding or rebound.    Musculoskeletal:        General: No tenderness. Normal range of motion.     Cervical back: Normal range of motion and neck supple.  Skin:    General: Skin is warm and dry.     Findings: No abrasion or rash.  Neurological:     Mental Status: She is alert and oriented to person, place, and time. Mental status is at baseline.     GCS: GCS eye subscore is 4. GCS verbal subscore is 5. GCS motor subscore is 6.     Cranial Nerves: Cranial nerves are intact. No cranial nerve deficit.     Sensory: No sensory deficit.     Motor: Motor function is intact.  Psychiatric:        Attention and Perception: Attention normal.        Speech: Speech normal.        Behavior: Behavior normal.    ED Results / Procedures / Treatments   Labs (all labs ordered are listed, but only abnormal results are displayed) Labs Reviewed  COMPREHENSIVE METABOLIC PANEL - Abnormal; Notable for the  following components:      Result Value   Glucose, Bld 133 (*)    BUN 21 (*)    Creatinine, Ser 1.84 (*)    Total Protein 8.2 (*)    AST 13 (*)    GFR, Estimated 32 (*)    All other components within normal limits  URINALYSIS, ROUTINE  W REFLEX MICROSCOPIC - Abnormal; Notable for the following components:   APPearance HAZY (*)    Hgb urine dipstick LARGE (*)    Protein, ur >=300 (*)    Leukocytes,Ua MODERATE (*)    Bacteria, UA RARE (*)    All other components within normal limits  LIPASE, BLOOD  CBC    EKG None  Radiology CT ABDOMEN PELVIS WO CONTRAST  Result Date: 02/15/2023 CLINICAL DATA:  Bowel obstruction suspected t complaining of abdominal pain in the abdomen. Does have an colostomy which she said has had no output for the last 6 days. Sharp pain in the upper left abdomen and pain in the lower rt abdomen that radiates to the back and down the leg. EXAM: CT ABDOMEN AND PELVIS WITHOUT CONTRAST TECHNIQUE: Multidetector CT imaging of the abdomen and pelvis was performed following the standard protocol without IV contrast. RADIATION DOSE REDUCTION: This exam was performed according to the departmental dose-optimization program which includes automated exposure control, adjustment of the mA and/or kV according to patient size and/or use of iterative reconstruction technique. COMPARISON:  CT abdomen pelvis 12/19/2015, CT abdomen pelvis 06/16/2017, CT abdomen pelvis 06/18/2019, CT abdomen pelvis 10/04/2022 FINDINGS: Lower chest: No acute abnormality. Hepatobiliary: No focal liver abnormality. No gallstones, gallbladder wall thickening, or pericholecystic fluid. No biliary dilatation. Pancreas: No focal lesion. Normal pancreatic contour. No surrounding inflammatory changes. No main pancreatic ductal dilatation. Spleen: Normal in size without focal abnormality. Adrenals/Urinary Tract: No adrenal nodule bilaterally. Bilateral ureteral stents. Right ureteral stent proximal pigtail within the right  renal pelvis and distal pigtail within the urinary bladder lumen. Left ureteral stent with proximal pigtail at the left uteropelvic junction and distal pigtail within the urinary bladder lumen. Associated worsening of severe left hydronephrosis and associated persistent moderate right hydronephrosis. No nephroureterolithiasis. Hydroureter bilaterally. No nephroureterolithiasis. The urinary bladder is unremarkable. Stomach/Bowel: Surgical changes related to a lower anterior resection and left abdominal end colostomy formation. Stomach is within normal limits. No evidence of bowel wall thickening or dilatation. Rectal stump communicates with previously identified presacral soft tissues with associated gas. Adhesive adhesive versus truncated versus communicating appendix with the presacral soft tissues in surgical site of the anterior lower resection (9:16, 7:112). The appendix appears to be thickened caliber measuring up to 14 mm. The appendiceal tip is not visualized. Fatty replacement of the appendiceal wall suggest chronic inflammatory changes. No acute inflammatory changes identified along the cecum or the base of the appendix. Vascular/Lymphatic: No abdominal aorta or iliac aneurysm. Mild atherosclerotic plaque of the aorta and its branches. No abdominal, pelvic, or inguinal lymphadenopathy. Reproductive: Hysterectomy.  No mass. Other: No intraperitoneal free fluid. No intraperitoneal free gas. No organized fluid collection. Musculoskeletal: No abdominal wall hernia or abnormality. No suspicious lytic or blastic osseous lesions. Interval increase in size of a right acetabular lytic lesion likely representing subchondral cysts/degenerative changes. No acute displaced fracture. L4-L5 posterolateral interbody surgical hardware fusion. Intervertebral disc space narrowing and endplate sclerosis at the L3-L4 level. Intervertebral disc space vacuum phenomenon at the L2-L3 level. IMPRESSION: 1. Interval worsening of  severe left hydronephrosis in the setting of slight distal migration of the uterus stent with proximal pigtail at the left uteropelvic junction. 2. Persistent moderate right hydronephrosis in the setting of a right ureteral stent in appropriate position. 3. Anterior lower resection surgical changes. Rectal stump communicates with previously identified presacral soft tissues density with associated gas. Possible associated communication with the appendix versus a truncated appendix or adhesive appendix. No definite findings  of acute appendicitis. 4.  Aortic Atherosclerosis (ICD10-I70.0). 5. Limited study on this noncontrast study. 6. Interval increase in size of a right acetabular lytic lesion likely representing subchondral cysts/degenerative changes. Recommend attention on follow-up. Electronically Signed   By: Tish Frederickson M.D.   On: 02/15/2023 01:35    Procedures Procedures    Medications Ordered in ED Medications  lactated ringers bolus 2,000 mL (has no administration in time range)  lactated ringers infusion (has no administration in time range)  morphine (PF) 4 MG/ML injection 8 mg (has no administration in time range)    ED Course/ Medical Decision Making/ A&P                                 Medical Decision Making Amount and/or Complexity of Data Reviewed Labs: ordered.  Risk Prescription drug management.  CT of abdomen and pelvis shows evidence of worsening left-sided hydronephrosis due to stent migration per my interpretation. Patient has evidence of acute kidney injury.  Likely from dehydration versus her known ureteral obstruction.  Patient will be given IV fluids here.  Also started on IV antibiotics due to urinalysis showing infection.  She had low-grade temperature here but has no white count.  Case discussed with Dr. Margo Aye from urology who has seen the patient.  He request that patient remain n.p.o. and he will likely do a stent later today.  Request medicine admit.  Will  consult hospitalist team        Final Clinical Impression(s) / ED Diagnoses Final diagnoses:  None    Rx / DC Orders ED Discharge Orders     None         Lorre Nick, MD 02/15/23 [phone]

## 2023-02-15 NOTE — Plan of Care (Signed)
Discuss and review plan of care with patient/family  

## 2023-02-15 NOTE — H&P (View-Only) (Signed)
Urology Consult   Physician requesting consult: Dr. Freida Busman  Reason for consult: bilateral ureteral obstruction  History of Present Illness: Lindsay Little is a 57 y.o. with metastatic rectal ca and bilateral ureteral strictures/obstruction that has been managed with bilateral dj stents (Dr. Logan Bores at [facility]).  Last changed 12/2015.  Patient currently admitted with left abd and epigastric pain with Ct showing worsening left hydronephrosis.  Stable on right.  Radiologist commented that left stent appears to be more distal but I think it is in good position with coil at [facility].  He denies a history of voiding or storage urinary symptoms, hematuria, UTIs, STDs, urolithiasis, GU malignancy/trauma/surgery.  Past Medical History:  Diagnosis Date   Anxiety    Chemotherapy-induced neuropathy (HCC)    toes and fingers numbness and tingling   Colorectal delayed anastomotic leak s/p resection & colostomy 07/12/2016 11/17/2015   Colostomy in place [facility]) 07/12/2016   due to anastomosis breakdown w/ colovaginal fistula   Colovaginal fistula s/p omentopexy repair 07/12/2016 07/10/2016   Complication of anesthesia    Depression    Family history of adverse reaction to anesthesia    mother-- ponv   Family history of breast cancer    Family history of pancreatic cancer    Family history of skin cancer    Gastroenteritis 12/20/2015   GERD (gastroesophageal reflux disease)    Hardware complicating wound infection (HCC) 08/03/2019   Hiatal hernia    History of cancer chemotherapy 02-20-2015 to 03-30-2015   History of cardiac murmur as a child    History of chemotherapy    History of chronic gastritis    History of hypertension    no issues since multiple abdminal sx's and chemo--- no medication since 12/ 2016   History of TMJ disorder    Hypercholesteremia    Hypertension    IBS (irritable bowel syndrome)    dx age 62   Intermittent abdominal pain    post-op multiple abdominal sx's    Microcytic anemia     Mild sleep apnea    per pt study 2014  very mild osa , no cpap recommended, recommeded wt loss and sleep routine   OA (osteoarthritis)    left knee /  left shoulder   Obesity    Parastomal hernia s/p lap repair w mesh 07/23/2018 07/23/2018   Pelvic abscess s/p drainage & omental pedicle flap 11/17/2015 06/23/2015   PONV (postoperative nausea and vomiting)    severe" needs Scopolamine PATCH"    Portacath in place    right chest   Rectal adenocarcinoma (HCC) oncologis-  dr Mosetta Putt-- after radiation/ chemo (ypT1, N0) --  no recurrence per last note 03/ 2018   dx 01-13-2015-- Stage IIIC (T3, N2, M0) post concurrent radiation and chemotherapy 02-20-2015 to 03-30-2015 /  05-25-2015 s/p  LAR w/ RSO (post-op complicated by late abscess and contained anatomotic leak w/ help percutaneous drainage and antibiotics)    Rectocutaneous fistula 07/10/2016   Rotator cuff tear, left    S/P radiation therapy 02/20/15-03/30/15   colon/rectal   Vitamin D deficiency    Wears glasses    Wears glasses     Past Surgical History:  Procedure Laterality Date   ABDOMINAL HYSTERECTOMY  1996   uterus and cervix   BACK SURGERY  02/12/2018   lumbar surgery   COLON RESECTION N/A 07/12/2016   Procedure: LAPAROSCOPIC LYSIS OF ADHESIONS, OMENTOPEXY, HAND ASSISTED RESECTION OF  COLON, END TO END ANASTOMOSIS, COLOSTOMY;  Surgeon: [name], MD;  Location:  [facility];  Service: General;  Laterality: N/A;   COMBINED HYSTEROSCOPY DIAGNOSTIC / D&C  x2 1990's   DIAGNOSTIC LAPAROSCOPY  age 26 and age 32   EUS N/A 01/18/2015   Procedure: LOWER ENDOSCOPIC ULTRASOUND (EUS);  Surgeon: Willis Modena, MD;  Location: [facility];  Service: Endoscopy;  Laterality: N/A;   EXCISION OF SKIN TAG  11/17/2015   Procedure: EXCISION OF SKIN TAG;  Surgeon: [name], MD;  Location: [facility];  Service: General;;   ILEO LOOP COLOSTOMY CLOSURE N/A 11/17/2015   Procedure: LAPAROSCOPIC DIVERTING LOOP ILEOSTOMY  DRAINAGE OF PELVIC ABSCESS;  Surgeon: [name], MD;  Location: [facility];  Service: General;  Laterality: N/A;   ILEOSTOMY CLOSURE N/A 05/31/2016   Procedure: TAKEDOWN LOOP ILEOSTOMY;  Surgeon: [name], MD;  Location: [facility];  Service: General;  Laterality: N/A;   IMPACTION REMOVAL  11/17/2015   Procedure: DISIMPACTION REMOVAL;  Surgeon: [name], MD;  Location: [facility];  Service: General;;   INSERTION OF MESH N/A 07/23/2018   Procedure: INSERTION OF MESH X2;  Surgeon: [name], MD;  Location: [facility];  Service: General;  Laterality: N/A;   IR LUMBAR DISC ASPIRATION W/IMG GUIDE  07/01/2019   KNEE ARTHROSCOPY Left 1990's   KNEE ARTHROSCOPY W/ MENISCECTOMY Left 09/14/2009   and chondroplasty debridement   LAPAROSCOPIC LYSIS OF ADHESIONS  11/17/2015   Procedure: LAPAROSCOPIC LYSIS OF ADHESIONS;  Surgeon: [name], MD;  Location: [facility];  Service: General;;   LUMBAR WOUND DEBRIDEMENT N/A 04/24/2018   Procedure: wound exploration, irrigation and debridement;  Surgeon: Tia Alert, MD;  Location: [facility];  Service: Neurosurgery;  Laterality: N/A;  wound exploration, irrigation and debridement   LYSIS OF ADHESION N/A 07/23/2018   Procedure: LYSIS OF ADHESIONS;  Surgeon: [name], MD;  Location: [facility];  Service: General;  Laterality: N/A;   PORT-A-CATH REMOVAL N/A 11/21/2016   Procedure: REMOVAL PORT-A-CATH;  Surgeon: [name], MD;  Location: [facility];  Service: General;  Laterality: N/A;   PORTACATH PLACEMENT N/A 05/25/2015   Procedure: INSERTION PORT-A-CATH;  Surgeon: [name], MD;  Location: [facility];  Service: General;  Laterality: N/A;-remains inplace Right chest.   ROTATOR CUFF REPAIR Right 2006   TONSILLECTOMY  age 65   VENTRAL HERNIA REPAIR N/A 07/23/2018   Procedure: LAPAROSCOPIC VENTRAL WALL HERNIA REPAIR;  Surgeon: [name], MD;  Location: [facility];  Service: General;  Laterality: N/A;   XI ROBOTIC ASSISTED LOWER ANTERIOR RESECTION N/A 05/25/2015   Procedure: XI ROBOTIC ASSISTED LOWER  ANTERIOR RESECTION, , RIGID PROCTOSCOPY, RIGHT OOPHORECTOMY;  Surgeon: [name], MD;  Location: [facility];  Service: General;  Laterality: N/A;    Medications:  Home meds:    Scheduled Meds:  docusate sodium  100 mg Oral BID   DULoxetine  120 mg Oral Daily   famotidine  20 mg Oral BID   heparin  5,000 Units Subcutaneous Q8H   levETIRAcetam  500 mg Oral BID   [START ON 02/16/2023] levothyroxine  50 mcg Oral q morning   oxybutynin  15 mg Oral QHS   polyethylene glycol  17 g Oral Daily   pregabalin  225 mg Oral BID   rosuvastatin  5 mg Oral QHS   Continuous Infusions:  cefTRIAXone (ROCEPHIN)  IV Stopped (02/15/23 0937)   lactated ringers 125 mL/hr at 02/15/23 1244   PRN Meds:.acetaminophen **OR** acetaminophen, albuterol, HYDROmorphone (DILAUDID) injection, ondansetron **OR** ondansetron (ZOFRAN) IV, traZODone  Allergies:  Allergies  Allergen Reactions  Caine-1 [Lidocaine] Swelling and Rash    Eyes swell shut; includes all caine drugs except marcaine. EMLA cream OK though    Bupropion Other (See Comments)    Not effective per Pt.    Sulfa Antibiotics Nausea And Vomiting and Rash   Pantoprazole Other (See Comments)    Not effective per Pt.    Adhesive [Tape] Rash and Other (See Comments)    Blisters - can use paper tape   Doxycycline Nausea Only   Flagyl [Metronidazole] Nausea Only   Iron Nausea Only   Metoprolol Nausea Only and Palpitations   Oxycodone Other (See Comments)    NIGHTMARES. (tolerates hydrocodone or tramadol better)   Penicillins Nausea Only and Rash    Family History  Problem Relation Age of Onset   Coronary artery disease Mother 61   Hypertension Mother    Hyperlipidemia Mother    Diabetes Mellitus I Mother    Coronary artery disease Father    Hyperlipidemia Father    Hypertension Father    Cancer Sister        skin - non melanoma   Hyperlipidemia Brother    Cancer Maternal Uncle 39       pancreatic with mets to colon and prostate   Cirrhosis  Maternal Uncle    Hypertension Maternal Grandmother    Diabetes Mellitus I Maternal Grandmother    Hyperlipidemia Maternal Grandmother    CVA Maternal Grandmother    Hypertension Maternal Grandfather    Coronary artery disease Maternal Grandfather 51   Hyperlipidemia Maternal Grandfather    Coronary artery disease Paternal Grandmother    Hypertension Paternal Grandmother    Hyperlipidemia Paternal Grandmother    Diabetes Mellitus I Paternal Grandmother    Hypertension Paternal Grandfather    Hyperlipidemia Paternal Grandfather    Coronary artery disease Paternal Grandfather     Social History:  reports that she quit smoking about 31 years ago. Her smoking use included cigarettes. She started smoking about 38 years ago. She has a 1.8 pack-year smoking history. She has never used smokeless tobacco. She reports that she does not drink alcohol and does not use drugs.  ROS: A complete review of systems was performed.  All systems are negative except for pertinent findings as noted.  Physical Exam:  Vital signs in last 24 hours: Temp:  [97.9 F (36.6 C)-100 F (37.8 C)] 98.4 F (36.9 C) (08/03 1204) Pulse Rate:  [44-131] 83 (08/03 1526) Resp:  [15-19] 16 (08/03 1526) BP: (108-148)/(67-111) 144/85 (08/03 1526) SpO2:  [91 %-100 %] 93 % (08/03 1526) Weight:  [132.9 kg] 132.9 kg (08/02 1932) Constitutional:  Alert and oriented, No acute distress Cardiovascular: Regular rate and rhythm Respiratory: Normal respiratory effort, Lungs clear bilaterally GI: Abdomen is soft, nontender, nondistended, ;left colostomy stoma bag empty Neurologic: Grossly intact, no focal deficits Psychiatric: Normal mood and affect  Laboratory Data:  Recent Labs    02/14/23 1946  WBC 9.8  HGB 12.0  HCT 38.8  PLT 341    Recent Labs    02/14/23 1946  NA 136  K 3.7  CL 101  GLUCOSE 133*  BUN 21*  CALCIUM 9.4  CREATININE 1.84*     Results for orders placed or performed during the hospital  encounter of 02/14/23 (from the past 24 hour(s))  Lipase, blood     Status: None   Collection Time: 02/14/23  7:46 PM  Result Value Ref Range   Lipase 28 11 - 51 U/L  Comprehensive metabolic panel  Status: Abnormal   Collection Time: 02/14/23  7:46 PM  Result Value Ref Range   Sodium 136 135 - 145 mmol/L   Potassium 3.7 3.5 - 5.1 mmol/L   Chloride 101 98 - 111 mmol/L   CO2 25 22 - 32 mmol/L   Glucose, Bld 133 (H) 70 - 99 mg/dL   BUN 21 (H) 6 - 20 mg/dL   Creatinine, Ser 9.62 (H) 0.44 - 1.00 mg/dL   Calcium 9.4 8.9 - 95.2 mg/dL   Total Protein 8.2 (H) 6.5 - 8.1 g/dL   Albumin 3.7 3.5 - 5.0 g/dL   AST 13 (L) 15 - 41 U/L   ALT 13 0 - 44 U/L   Alkaline Phosphatase 73 38 - 126 U/L   Total Bilirubin 0.3 0.3 - 1.2 mg/dL   GFR, Estimated 32 (L) >60 mL/min   Anion gap 10 5 - 15  CBC     Status: None   Collection Time: 02/14/23  7:46 PM  Result Value Ref Range   WBC 9.8 4.0 - 10.5 K/uL   RBC 4.58 3.87 - 5.11 MIL/uL   Hemoglobin 12.0 12.0 - 15.0 g/dL   HCT 84.1 32.4 - 40.1 %   MCV 84.7 80.0 - 100.0 fL   MCH 26.2 26.0 - 34.0 pg   MCHC 30.9 30.0 - 36.0 g/dL   RDW 02.7 25.3 - 66.4 %   Platelets 341 150 - 400 K/uL   nRBC 0.0 0.0 - 0.2 %  Urinalysis, Routine w reflex microscopic -Urine, Clean Catch     Status: Abnormal   Collection Time: 02/14/23  7:46 PM  Result Value Ref Range   Color, Urine YELLOW YELLOW   APPearance HAZY (A) CLEAR   Specific Gravity, Urine 1.018 1.005 - 1.030   pH 5.0 5.0 - 8.0   Glucose, UA NEGATIVE NEGATIVE mg/dL   Hgb urine dipstick LARGE (A) NEGATIVE   Bilirubin Urine NEGATIVE NEGATIVE   Ketones, ur NEGATIVE NEGATIVE mg/dL   Protein, ur >=403 (A) NEGATIVE mg/dL   Nitrite NEGATIVE NEGATIVE   Leukocytes,Ua MODERATE (A) NEGATIVE   RBC / HPF >50 0 - 5 RBC/hpf   WBC, UA 21-50 0 - 5 WBC/hpf   Bacteria, UA RARE (A) NONE SEEN   Squamous Epithelial / HPF 0-5 0 - 5 /HPF   Mucus PRESENT    No results found for this or any previous visit (from the past 240  hour(s)).  Renal Function: Recent Labs    02/14/23 1946  CREATININE 1.84*   Estimated Creatinine Clearance: 51.5 mL/min (A) (by C-G formula based on SCr of 1.84 mg/dL (H)).  Radiologic Imaging: CT ABDOMEN PELVIS WO CONTRAST  Result Date: 02/15/2023 CLINICAL DATA:  Bowel obstruction suspected t complaining of abdominal pain in the abdomen. Does have an colostomy which she said has had no output for the last 6 days. Sharp pain in the upper left abdomen and pain in the lower rt abdomen that radiates to the back and down the leg. EXAM: CT ABDOMEN AND PELVIS WITHOUT CONTRAST TECHNIQUE: Multidetector CT imaging of the abdomen and pelvis was performed following the standard protocol without IV contrast. RADIATION DOSE REDUCTION: This exam was performed according to the departmental dose-optimization program which includes automated exposure control, adjustment of the mA and/or kV according to patient size and/or use of iterative reconstruction technique. COMPARISON:  CT abdomen pelvis 12/19/2015, CT abdomen pelvis 06/16/2017, CT abdomen pelvis 06/18/2019, CT abdomen pelvis 10/04/2022 FINDINGS: Lower chest: No acute abnormality. Hepatobiliary: No focal liver  abnormality. No gallstones, gallbladder wall thickening, or pericholecystic fluid. No biliary dilatation. Pancreas: No focal lesion. Normal pancreatic contour. No surrounding inflammatory changes. No main pancreatic ductal dilatation. Spleen: Normal in size without focal abnormality. Adrenals/Urinary Tract: No adrenal nodule bilaterally. Bilateral ureteral stents. Right ureteral stent proximal pigtail within the right renal pelvis and distal pigtail within the urinary bladder lumen. Left ureteral stent with proximal pigtail at the left uteropelvic junction and distal pigtail within the urinary bladder lumen. Associated worsening of severe left hydronephrosis and associated persistent moderate right hydronephrosis. No nephroureterolithiasis. Hydroureter  bilaterally. No nephroureterolithiasis. The urinary bladder is unremarkable. Stomach/Bowel: Surgical changes related to a lower anterior resection and left abdominal end colostomy formation. Stomach is within normal limits. No evidence of bowel wall thickening or dilatation. Rectal stump communicates with previously identified presacral soft tissues with associated gas. Adhesive adhesive versus truncated versus communicating appendix with the presacral soft tissues in surgical site of the anterior lower resection (9:16, 7:112). The appendix appears to be thickened caliber measuring up to 14 mm. The appendiceal tip is not visualized. Fatty replacement of the appendiceal wall suggest chronic inflammatory changes. No acute inflammatory changes identified along the cecum or the base of the appendix. Vascular/Lymphatic: No abdominal aorta or iliac aneurysm. Mild atherosclerotic plaque of the aorta and its branches. No abdominal, pelvic, or inguinal lymphadenopathy. Reproductive: Hysterectomy.  No mass. Other: No intraperitoneal free fluid. No intraperitoneal free gas. No organized fluid collection. Musculoskeletal: No abdominal wall hernia or abnormality. No suspicious lytic or blastic osseous lesions. Interval increase in size of a right acetabular lytic lesion likely representing subchondral cysts/degenerative changes. No acute displaced fracture. L4-L5 posterolateral interbody surgical hardware fusion. Intervertebral disc space narrowing and endplate sclerosis at the L3-L4 level. Intervertebral disc space vacuum phenomenon at the L2-L3 level. IMPRESSION: 1. Interval worsening of severe left hydronephrosis in the setting of slight distal migration of the uterus stent with proximal pigtail at the left uteropelvic junction. 2. Persistent moderate right hydronephrosis in the setting of a right ureteral stent in appropriate position. 3. Anterior lower resection surgical changes. Rectal stump communicates with previously  identified presacral soft tissues density with associated gas. Possible associated communication with the appendix versus a truncated appendix or adhesive appendix. No definite findings of acute appendicitis. 4.  Aortic Atherosclerosis (ICD10-I70.0). 5. Limited study on this noncontrast study. 6. Interval increase in size of a right acetabular lytic lesion likely representing subchondral cysts/degenerative changes. Recommend attention on follow-up. Electronically Signed   By: Tish Frederickson M.D.   On: 02/15/2023 01:35    I independently reviewed the above imaging studies.  Impression/Recommendation Bilateral ureteral obstruction with worsening hydro on left despite dj stents.  Likely not providing optimal drainage. Options discussed.  Patient elects bilateral stent change.  Will set up for tomorrow. Also discussed that often times standard stents fail to provide adequate drainage over time.  May need to consider metalliac stents or PCN in future. Keep NPO pMN Informed consent obtained.  Joline Maxcy 02/15/2023, 4:01 PM

## 2023-02-15 NOTE — H&P (Signed)
History and Physical  Lindsay Little WUJ:[id number] DOB: Dec 21, 1965 DOA: 02/14/2023  PCP: Laurann Montana, MD   Chief Complaint: Abdominal pain and cramping  HPI: Lindsay Little is a 57 y.o. female with medical history significant for hypertension, hyperlipidemia, rectal cancer status post resection/colostomy, chronic abdominal pain, history of multiple intra-abdominal abscesses and recurrent SBO who is being admitted to the hospital with flank pain, worsening left-sided hydronephrosis and acute renal failure.  Patient states that she has been eating, has had intermittent very minimal nausea until today, when it got worse.  She has also been having left-sided abdominal pain behind her ostomy, as well as right lower abdominal cramping.  Pain is a little bit different than her chronic abdominal pain, and has been gradually getting slightly worse.  States that she also has not had a bowel movement in exactly 7 days, has been eating well, has been having some slight nausea.  For the last 5 days, she has been taking MiraLAX twice daily.  In the last 24 hours, her nausea has gotten worse, however she has not vomited.  Denies any fevers, chills, chest pain.  Denies any dysuria.  She normally urinates quite a bit, and there has been no change in this.  ED Course: Evaluation in the ER, she is afebrile, slightly tachycardic, with slightly elevated blood pressure.  Saturating well on room air.  Lab work was done as detailed below, significant for creatinine 1.84 on normal baseline.  Urinalysis is abnormal, with rare bacteria.  CT of the abdomen pelvis was done, as noted below shows evidence of worsening left-sided hydronephrosis and slightly displaced ureteral stent.  Review of Systems: Please see HPI for pertinent positives and negatives. A complete 10 system review of systems are otherwise negative.  Past Medical History:  Diagnosis Date   Anxiety    Chemotherapy-induced neuropathy (HCC)    toes and  fingers numbness and tingling   Colorectal delayed anastomotic leak s/p resection & colostomy 07/12/2016 11/17/2015   Colostomy in place [facility]) 07/12/2016   due to anastomosis breakdown w/ colovaginal fistula   Colovaginal fistula s/p omentopexy repair 07/12/2016 07/10/2016   Complication of anesthesia    Depression    Family history of adverse reaction to anesthesia    mother-- ponv   Family history of breast cancer    Family history of pancreatic cancer    Family history of skin cancer    Gastroenteritis 12/20/2015   GERD (gastroesophageal reflux disease)    Hardware complicating wound infection (HCC) 08/03/2019   Hiatal hernia    History of cancer chemotherapy 02-20-2015 to 03-30-2015   History of cardiac murmur as a child    History of chemotherapy    History of chronic gastritis    History of hypertension    no issues since multiple abdminal sx's and chemo--- no medication since 12/ 2016   History of TMJ disorder    Hypercholesteremia    Hypertension    IBS (irritable bowel syndrome)    dx age 11   Intermittent abdominal pain    post-op multiple abdominal sx's    Microcytic anemia    Mild sleep apnea    per pt study 2014  very mild osa , no cpap recommended, recommeded wt loss and sleep routine   OA (osteoarthritis)    left knee /  left shoulder   Obesity    Parastomal hernia s/p lap repair w mesh 07/23/2018 07/23/2018   Pelvic abscess s/p drainage & omental pedicle flap 11/17/2015  06/23/2015   PONV (postoperative nausea and vomiting)    severe" needs Scopolamine PATCH"    Portacath in place    right chest   Rectal adenocarcinoma (HCC) oncologis-  dr Mosetta Putt-- after radiation/ chemo (ypT1, N0) --  no recurrence per last note 03/ 2018   dx 01-13-2015-- Stage IIIC (T3, N2, M0) post concurrent radiation and chemotherapy 02-20-2015 to 03-30-2015 /  05-25-2015 s/p  LAR w/ RSO (post-op complicated by late abscess and contained anatomotic leak w/ help percutaneous drainage and antibiotics)     Rectocutaneous fistula 07/10/2016   Rotator cuff tear, left    S/P radiation therapy 02/20/15-03/30/15   colon/rectal   Vitamin D deficiency    Wears glasses    Wears glasses    Past Surgical History:  Procedure Laterality Date   ABDOMINAL HYSTERECTOMY  1996   uterus and cervix   BACK SURGERY  02/12/2018   lumbar surgery   COLON RESECTION N/A 07/12/2016   Procedure: LAPAROSCOPIC LYSIS OF ADHESIONS, OMENTOPEXY, HAND ASSISTED RESECTION OF  COLON, END TO END ANASTOMOSIS, COLOSTOMY;  Surgeon: [name], MD;  Location: [facility];  Service: General;  Laterality: N/A;   COMBINED HYSTEROSCOPY DIAGNOSTIC / D&C  x2 1990's   DIAGNOSTIC LAPAROSCOPY  age 61 and age 57   EUS N/A 01/18/2015   Procedure: LOWER ENDOSCOPIC ULTRASOUND (EUS);  Surgeon: Willis Modena, MD;  Location: [facility];  Service: Endoscopy;  Laterality: N/A;   EXCISION OF SKIN TAG  11/17/2015   Procedure: EXCISION OF SKIN TAG;  Surgeon: [name], MD;  Location: [facility];  Service: General;;   ILEO LOOP COLOSTOMY CLOSURE N/A 11/17/2015   Procedure: LAPAROSCOPIC DIVERTING LOOP ILEOSTOMY  DRAINAGE OF PELVIC ABSCESS;  Surgeon: [name], MD;  Location: [facility];  Service: General;  Laterality: N/A;   ILEOSTOMY CLOSURE N/A 05/31/2016   Procedure: TAKEDOWN LOOP ILEOSTOMY;  Surgeon: [name], MD;  Location: [facility];  Service: General;  Laterality: N/A;   IMPACTION REMOVAL  11/17/2015   Procedure: DISIMPACTION REMOVAL;  Surgeon: [name], MD;  Location: [facility];  Service: General;;   INSERTION OF MESH N/A 07/23/2018   Procedure: INSERTION OF MESH X2;  Surgeon: [name], MD;  Location: [facility];  Service: General;  Laterality: N/A;   IR LUMBAR DISC ASPIRATION W/IMG GUIDE  07/01/2019   KNEE ARTHROSCOPY Left 1990's   KNEE ARTHROSCOPY W/ MENISCECTOMY Left 09/14/2009   and chondroplasty debridement   LAPAROSCOPIC LYSIS OF ADHESIONS  11/17/2015   Procedure: LAPAROSCOPIC LYSIS OF ADHESIONS;  Surgeon: [name], MD;  Location: [facility];   Service: General;;   LUMBAR WOUND DEBRIDEMENT N/A 04/24/2018   Procedure: wound exploration, irrigation and debridement;  Surgeon: Tia Alert, MD;  Location: [facility];  Service: Neurosurgery;  Laterality: N/A;  wound exploration, irrigation and debridement   LYSIS OF ADHESION N/A 07/23/2018   Procedure: LYSIS OF ADHESIONS;  Surgeon: [name], MD;  Location: [facility];  Service: General;  Laterality: N/A;   PORT-A-CATH REMOVAL N/A 11/21/2016   Procedure: REMOVAL PORT-A-CATH;  Surgeon: [name], MD;  Location: [facility];  Service: General;  Laterality: N/A;   PORTACATH PLACEMENT N/A 05/25/2015   Procedure: INSERTION PORT-A-CATH;  Surgeon: [name], MD;  Location: [facility];  Service: General;  Laterality: N/A;-remains inplace Right chest.   ROTATOR CUFF REPAIR Right 2006   TONSILLECTOMY  age 83   VENTRAL HERNIA REPAIR N/A 07/23/2018   Procedure: LAPAROSCOPIC VENTRAL WALL HERNIA REPAIR;  Surgeon: [name], MD;  Location:  [facility];  Service: General;  Laterality: N/A;   XI ROBOTIC ASSISTED LOWER ANTERIOR RESECTION N/A 05/25/2015   Procedure: XI ROBOTIC ASSISTED LOWER ANTERIOR RESECTION, , RIGID PROCTOSCOPY, RIGHT OOPHORECTOMY;  Surgeon: [name], MD;  Location: [facility];  Service: General;  Laterality: N/A;    Social History:  reports that she quit smoking about 31 years ago. Her smoking use included cigarettes. She started smoking about 38 years ago. She has a 1.8 pack-year smoking history. She has never used smokeless tobacco. She reports that she does not drink alcohol and does not use drugs.   Allergies  Allergen Reactions   Caine-1 [Lidocaine] Swelling and Rash    Eyes swell shut; includes all caine drugs except marcaine. EMLA cream OK though (?!)   Bupropion Other (See Comments)    Not effective per Pt.    Sulfa Antibiotics Nausea And Vomiting and Rash   Pantoprazole Other (See Comments)    Not effective per Pt.    Adhesive [Tape] Rash and Other (See Comments)     Blisters - can use paper tape   Doxycycline Nausea Only   Flagyl [Metronidazole] Nausea Only   Iron Nausea Only   Metoprolol Nausea Only and Palpitations   Oxycodone Other (See Comments)    NIGHTMARES. (tolerates hydrocodone or tramadol better)   Penicillins Nausea Only and Rash    Has patient had a PCN reaction causing immediate rash, facial/tongue/throat swelling, SOB or lightheadedness with hypotension: no Has patient had a PCN reaction causing severe rash involving mucus membranes or skin necrosis: no Has patient had a PCN reaction that required hospitalization no Has patient had a PCN reaction occurring within the last 10 years: no If all of the above answers are "NO", then may proceed with Cephalosporin use.     Family History  Problem Relation Age of Onset   Coronary artery disease Mother 75   Hypertension Mother    Hyperlipidemia Mother    Diabetes Mellitus I Mother    Coronary artery disease Father    Hyperlipidemia Father    Hypertension Father    Cancer Sister        skin - non melanoma   Hyperlipidemia Brother    Cancer Maternal Uncle 79       pancreatic with mets to colon and prostate   Cirrhosis Maternal Uncle    Hypertension Maternal Grandmother    Diabetes Mellitus I Maternal Grandmother    Hyperlipidemia Maternal Grandmother    CVA Maternal Grandmother    Hypertension Maternal Grandfather    Coronary artery disease Maternal Grandfather 37   Hyperlipidemia Maternal Grandfather    Coronary artery disease Paternal Grandmother    Hypertension Paternal Grandmother    Hyperlipidemia Paternal Grandmother    Diabetes Mellitus I Paternal Grandmother    Hypertension Paternal Grandfather    Hyperlipidemia Paternal Grandfather    Coronary artery disease Paternal Grandfather      Prior to Admission medications   Medication Sig Start Date End Date Taking? Authorizing Provider  acetaminophen (TYLENOL) 500 MG tablet Take 1,000 mg by mouth every 8 (eight) hours as  needed for moderate pain.    [provider]  celecoxib (CELEBREX) 100 MG capsule Take 100 mg by mouth 2 (two) times daily. 12/12/20   [provider]  Cholecalciferol (VITAMIN D3) 5000 units CAPS Take 5,000 Units by mouth at bedtime.     [provider]  Coenzyme Q10 (COQ10) 200 MG CAPS Take 200 mg by mouth at bedtime.  [provider]  docusate sodium (COLACE) 100 MG capsule Take 1 capsule (100 mg total) by mouth 2 (two) times daily. 10/31/22   [name], DO  DULoxetine (CYMBALTA) 60 MG capsule Take 120 mg by mouth daily.    [provider]  esomeprazole (NEXIUM) 40 MG capsule Take 1 capsule (40 mg total) by mouth 2 (two) times daily before a meal. 12/22/15   [name], MD  famotidine (PEPCID) 20 MG tablet Take 20 mg by mouth 2 (two) times daily. 08/28/22 02/24/23  [provider]  furosemide (LASIX) 20 MG tablet Take 20 mg by mouth daily as needed for edema.     [provider]  levETIRAcetam (KEPPRA) 250 MG tablet Take 500 mg by mouth 2 (two) times daily.    [provider]  levothyroxine (SYNTHROID) 50 MCG tablet Take 50 mcg by mouth every morning. 02/07/22   [provider]  Melatonin 3 MG TABS Take 3-6 mg by mouth at bedtime as needed (sleep).    [provider]  morphine (MS CONTIN) 15 MG 12 hr tablet Take 15 mg by mouth every 12 (twelve) hours.    [provider]  Multiple Vitamin (MULTIVITAMIN WITH MINERALS) TABS tablet Take 2 tablets by mouth at bedtime.     [provider]  ondansetron (ZOFRAN) 4 MG tablet Take 1 tablet (4 mg total) by mouth every 6 (six) hours as needed for nausea. 07/20/22   Sherryll Burger, Pratik D, DO  oxybutynin (DITROPAN XL) 15 MG 24 hr tablet Take 15 mg by mouth at bedtime.    [provider]  polyethylene glycol (MIRALAX / GLYCOLAX) 17 g packet Take 17 g by mouth daily.    [provider]  pregabalin (LYRICA) 225 MG capsule Take 225 mg by mouth 2  (two) times daily. 02/07/22   [provider]  promethazine (PHENERGAN) 25 MG tablet TAKE ONE TABLET BY MOUTH EVERY 6 HOURS AS NEEDED FOR NAUSEA OR VOMITING Patient taking differently: Take 25 mg by mouth every 6 (six) hours as needed for nausea or vomiting. 07/04/20   Daiva Eves, Lisette Grinder, MD  rosuvastatin (CRESTOR) 5 MG tablet Take 5 mg by mouth at bedtime.    [provider]  triamcinolone lotion (KENALOG) 0.1 % Apply 1 application topically as needed (dry skin).    [provider]    Physical Exam: BP (!) 141/82 (BP Location: Right Arm)   Pulse (!) 103   Temp 97.9 F (36.6 C) (Oral)   Resp 19   Ht 5\' 11"  (1.803 m)   Wt 132.9 kg   SpO2 100%   BMI 40.87 kg/m   General:  Alert, oriented, calm, in no acute distress, as usual very pleasant and cooperative Eyes: EOMI, clear conjuctivae, white sclerea Neck: supple, no masses, trachea mildline  Cardiovascular: RRR, no murmurs or rubs, no peripheral edema  Respiratory: clear to auscultation bilaterally, no wheezes, no crackles  Abdomen: soft, diffusely mildly tender, nondistended, normal bowel tones heard, left upper quadrant ostomy in place, with about 2 cc clear liquid Skin: dry, no rashes  Musculoskeletal: no joint effusions, normal range of motion  Psychiatric: appropriate affect, normal speech  Neurologic: extraocular muscles intact, clear speech, moving all extremities with intact sensorium          Labs on Admission:  Basic Metabolic Panel: Recent Labs  Lab 02/14/23 1946  NA 136  K 3.7  CL 101  CO2 25  GLUCOSE 133*  BUN 21*  CREATININE 1.84*  CALCIUM 9.4   Liver Function Tests: Recent Labs  Lab 02/14/23 1946  AST 13*  ALT 13  ALKPHOS 73  BILITOT 0.3  PROT 8.2*  ALBUMIN 3.7   Recent Labs  Lab 02/14/23 1946  LIPASE 28   No results for input(s): "AMMONIA" in the last 168 hours. CBC: Recent Labs  Lab 02/14/23 1946  WBC 9.8  HGB 12.0  HCT 38.8  MCV 84.7  PLT 341   Cardiac  Enzymes: No results for input(s): "CKTOTAL", "CKMB", "CKMBINDEX", "TROPONINI" in the last 168 hours.  BNP (last 3 results) No results for input(s): "BNP" in the last 8760 hours.  ProBNP (last 3 results) No results for input(s): "PROBNP" in the last 8760 hours.  CBG: No results for input(s): "GLUCAP" in the last 168 hours.  Radiological Exams on Admission: CT ABDOMEN PELVIS WO CONTRAST  Result Date: 02/15/2023 CLINICAL DATA:  Bowel obstruction suspected t complaining of abdominal pain in the abdomen. Does have an colostomy which she said has had no output for the last 6 days. Sharp pain in the upper left abdomen and pain in the lower rt abdomen that radiates to the back and down the leg. EXAM: CT ABDOMEN AND PELVIS WITHOUT CONTRAST TECHNIQUE: Multidetector CT imaging of the abdomen and pelvis was performed following the standard protocol without IV contrast. RADIATION DOSE REDUCTION: This exam was performed according to the departmental dose-optimization program which includes automated exposure control, adjustment of the mA and/or kV according to patient size and/or use of iterative reconstruction technique. COMPARISON:  CT abdomen pelvis 12/19/2015, CT abdomen pelvis 06/16/2017, CT abdomen pelvis 06/18/2019, CT abdomen pelvis 10/04/2022 FINDINGS: Lower chest: No acute abnormality. Hepatobiliary: No focal liver abnormality. No gallstones, gallbladder wall thickening, or pericholecystic fluid. No biliary dilatation. Pancreas: No focal lesion. Normal pancreatic contour. No surrounding inflammatory changes. No main pancreatic ductal dilatation. Spleen: Normal in size without focal abnormality. Adrenals/Urinary Tract: No adrenal nodule bilaterally. Bilateral ureteral stents. Right ureteral stent proximal pigtail within the right renal pelvis and distal pigtail within the urinary bladder lumen. Left ureteral stent with proximal pigtail at the left uteropelvic junction and distal pigtail within the urinary  bladder lumen. Associated worsening of severe left hydronephrosis and associated persistent moderate right hydronephrosis. No nephroureterolithiasis. Hydroureter bilaterally. No nephroureterolithiasis. The urinary bladder is unremarkable. Stomach/Bowel: Surgical changes related to a lower anterior resection and left abdominal end colostomy formation. Stomach is within normal limits. No evidence of bowel wall thickening or dilatation. Rectal stump communicates with previously identified presacral soft tissues with associated gas. Adhesive adhesive versus truncated versus communicating appendix with the presacral soft tissues in surgical site of the anterior lower resection (9:16, 7:112). The appendix appears to be thickened caliber measuring up to 14 mm. The appendiceal tip is not visualized. Fatty replacement of the appendiceal wall suggest chronic inflammatory changes. No acute inflammatory changes identified along the cecum or the base of the appendix. Vascular/Lymphatic: No abdominal aorta or iliac aneurysm. Mild atherosclerotic plaque of the aorta and its branches. No abdominal, pelvic, or inguinal lymphadenopathy. Reproductive: Hysterectomy.  No mass. Other: No intraperitoneal free fluid. No intraperitoneal free gas. No organized fluid collection. Musculoskeletal: No abdominal wall hernia or abnormality. No suspicious lytic or blastic osseous lesions. Interval increase in size of a right acetabular lytic lesion likely representing subchondral cysts/degenerative changes. No acute displaced fracture. L4-L5 posterolateral interbody surgical hardware fusion. Intervertebral disc space narrowing and endplate sclerosis at the L3-L4 level. Intervertebral disc space vacuum phenomenon at the L2-L3 level. IMPRESSION: 1.  Interval worsening of severe left hydronephrosis in the setting of slight distal migration of the uterus stent with proximal pigtail at the left uteropelvic junction. 2. Persistent moderate right  hydronephrosis in the setting of a right ureteral stent in appropriate position. 3. Anterior lower resection surgical changes. Rectal stump communicates with previously identified presacral soft tissues density with associated gas. Possible associated communication with the appendix versus a truncated appendix or adhesive appendix. No definite findings of acute appendicitis. 4.  Aortic Atherosclerosis (ICD10-I70.0). 5. Limited study on this noncontrast study. 6. Interval increase in size of a right acetabular lytic lesion likely representing subchondral cysts/degenerative changes. Recommend attention on follow-up. Electronically Signed   By: Tish Frederickson M.D.   On: 02/15/2023 01:35    Assessment/Plan SANI LOISEAU is a 57 y.o. female with medical history significant for hypertension, hyperlipidemia, rectal cancer status post resection/colostomy, chronic abdominal pain, history of multiple intra-abdominal abscesses and recurrent SBO who is being admitted to the hospital with flank pain, worsening left-sided hydronephrosis and acute renal failure.  Acute renal failure-baseline renal function is normal, possibly due to left-sided ureteral stent dysfunction in the setting of ureteral strictures.  May also possibly be due to acute cystitis. -Inpatient admission -Continue LR infusion -Avoid nephrotoxins -Recheck renal function with daily labs  Suspected UTI-no fevers, or dysuria however she does have abnormal urinalysis. -Follow urine culture -Empiric Rocephin 1 g IV daily  Left-sided hydronephrosis-in the setting of known ureteral strictures -EDP discussed with urology who plans stent exchange today -Will keep n.p.o. for this  Chronic pain-continue Lyrica, Cymbalta, MS Contin (instead of Belbuca while in-house), with IV Dilaudid as needed for breakthrough pain.   Hyperlipidemia-Crestor  Epilepsy-Keppra  GERD-Pepcid  DVT prophylaxis: Lovenox     Code Status: Full Code  Consults  called: None  Admission status: The appropriate patient status for this patient is INPATIENT. Inpatient status is judged to be reasonable and necessary in order to provide the required intensity of service to ensure the patient's safety. The patient's presenting symptoms, physical exam findings, and initial radiographic and laboratory data in the context of their chronic comorbidities is felt to place them at high risk for further clinical deterioration. Furthermore, it is not anticipated that the patient will be medically stable for discharge from the hospital within 2 midnights of admission.    I certify that at the point of admission it is my clinical judgment that the patient will require inpatient hospital care spanning beyond 2 midnights from the point of admission due to high intensity of service, high risk for further deterioration and high frequency of surveillance required  Time spent: 49 minutes   Sharlette Dense MD [facility] Pager [phone]  If 7PM-7AM, please contact night-coverage [email or web address] Password [facility]  02/15/2023, 8:46 AM

## 2023-02-15 NOTE — Consult Note (Signed)
Urology Consult   Physician requesting consult: Dr. Freida Busman  Reason for consult: bilateral ureteral obstruction  History of Present Illness: Lindsay Little is a 57 y.o. with metastatic rectal ca and bilateral ureteral strictures/obstruction that has been managed with bilateral dj stents (Dr. Logan Bores at [facility]).  Last changed 12/2015.  Patient currently admitted with left abd and epigastric pain with Ct showing worsening left hydronephrosis.  Stable on right.  Radiologist commented that left stent appears to be more distal but I think it is in good position with coil at [facility].  He denies a history of voiding or storage urinary symptoms, hematuria, UTIs, STDs, urolithiasis, GU malignancy/trauma/surgery.  Past Medical History:  Diagnosis Date   Anxiety    Chemotherapy-induced neuropathy (HCC)    toes and fingers numbness and tingling   Colorectal delayed anastomotic leak s/p resection & colostomy 07/12/2016 11/17/2015   Colostomy in place [facility]) 07/12/2016   due to anastomosis breakdown w/ colovaginal fistula   Colovaginal fistula s/p omentopexy repair 07/12/2016 07/10/2016   Complication of anesthesia    Depression    Family history of adverse reaction to anesthesia    mother-- ponv   Family history of breast cancer    Family history of pancreatic cancer    Family history of skin cancer    Gastroenteritis 12/20/2015   GERD (gastroesophageal reflux disease)    Hardware complicating wound infection (HCC) 08/03/2019   Hiatal hernia    History of cancer chemotherapy 02-20-2015 to 03-30-2015   History of cardiac murmur as a child    History of chemotherapy    History of chronic gastritis    History of hypertension    no issues since multiple abdminal sx's and chemo--- no medication since 12/ 2016   History of TMJ disorder    Hypercholesteremia    Hypertension    IBS (irritable bowel syndrome)    dx age 62   Intermittent abdominal pain    post-op multiple abdominal sx's    Microcytic anemia     Mild sleep apnea    per pt study 2014  very mild osa , no cpap recommended, recommeded wt loss and sleep routine   OA (osteoarthritis)    left knee /  left shoulder   Obesity    Parastomal hernia s/p lap repair w mesh 07/23/2018 07/23/2018   Pelvic abscess s/p drainage & omental pedicle flap 11/17/2015 06/23/2015   PONV (postoperative nausea and vomiting)    severe" needs Scopolamine PATCH"    Portacath in place    right chest   Rectal adenocarcinoma (HCC) oncologis-  dr Mosetta Putt-- after radiation/ chemo (ypT1, N0) --  no recurrence per last note 03/ 2018   dx 01-13-2015-- Stage IIIC (T3, N2, M0) post concurrent radiation and chemotherapy 02-20-2015 to 03-30-2015 /  05-25-2015 s/p  LAR w/ RSO (post-op complicated by late abscess and contained anatomotic leak w/ help percutaneous drainage and antibiotics)    Rectocutaneous fistula 07/10/2016   Rotator cuff tear, left    S/P radiation therapy 02/20/15-03/30/15   colon/rectal   Vitamin D deficiency    Wears glasses    Wears glasses     Past Surgical History:  Procedure Laterality Date   ABDOMINAL HYSTERECTOMY  1996   uterus and cervix   BACK SURGERY  02/12/2018   lumbar surgery   COLON RESECTION N/A 07/12/2016   Procedure: LAPAROSCOPIC LYSIS OF ADHESIONS, OMENTOPEXY, HAND ASSISTED RESECTION OF  COLON, END TO END ANASTOMOSIS, COLOSTOMY;  Surgeon: [name], MD;  Location:  [facility];  Service: General;  Laterality: N/A;   COMBINED HYSTEROSCOPY DIAGNOSTIC / D&C  x2 1990's   DIAGNOSTIC LAPAROSCOPY  age 26 and age 32   EUS N/A 01/18/2015   Procedure: LOWER ENDOSCOPIC ULTRASOUND (EUS);  Surgeon: Willis Modena, MD;  Location: [facility];  Service: Endoscopy;  Laterality: N/A;   EXCISION OF SKIN TAG  11/17/2015   Procedure: EXCISION OF SKIN TAG;  Surgeon: [name], MD;  Location: [facility];  Service: General;;   ILEO LOOP COLOSTOMY CLOSURE N/A 11/17/2015   Procedure: LAPAROSCOPIC DIVERTING LOOP ILEOSTOMY  DRAINAGE OF PELVIC ABSCESS;  Surgeon: [name], MD;  Location: [facility];  Service: General;  Laterality: N/A;   ILEOSTOMY CLOSURE N/A 05/31/2016   Procedure: TAKEDOWN LOOP ILEOSTOMY;  Surgeon: [name], MD;  Location: [facility];  Service: General;  Laterality: N/A;   IMPACTION REMOVAL  11/17/2015   Procedure: DISIMPACTION REMOVAL;  Surgeon: [name], MD;  Location: [facility];  Service: General;;   INSERTION OF MESH N/A 07/23/2018   Procedure: INSERTION OF MESH X2;  Surgeon: [name], MD;  Location: [facility];  Service: General;  Laterality: N/A;   IR LUMBAR DISC ASPIRATION W/IMG GUIDE  07/01/2019   KNEE ARTHROSCOPY Left 1990's   KNEE ARTHROSCOPY W/ MENISCECTOMY Left 09/14/2009   and chondroplasty debridement   LAPAROSCOPIC LYSIS OF ADHESIONS  11/17/2015   Procedure: LAPAROSCOPIC LYSIS OF ADHESIONS;  Surgeon: [name], MD;  Location: [facility];  Service: General;;   LUMBAR WOUND DEBRIDEMENT N/A 04/24/2018   Procedure: wound exploration, irrigation and debridement;  Surgeon: Tia Alert, MD;  Location: [facility];  Service: Neurosurgery;  Laterality: N/A;  wound exploration, irrigation and debridement   LYSIS OF ADHESION N/A 07/23/2018   Procedure: LYSIS OF ADHESIONS;  Surgeon: [name], MD;  Location: [facility];  Service: General;  Laterality: N/A;   PORT-A-CATH REMOVAL N/A 11/21/2016   Procedure: REMOVAL PORT-A-CATH;  Surgeon: [name], MD;  Location: [facility];  Service: General;  Laterality: N/A;   PORTACATH PLACEMENT N/A 05/25/2015   Procedure: INSERTION PORT-A-CATH;  Surgeon: [name], MD;  Location: [facility];  Service: General;  Laterality: N/A;-remains inplace Right chest.   ROTATOR CUFF REPAIR Right 2006   TONSILLECTOMY  age 65   VENTRAL HERNIA REPAIR N/A 07/23/2018   Procedure: LAPAROSCOPIC VENTRAL WALL HERNIA REPAIR;  Surgeon: [name], MD;  Location: [facility];  Service: General;  Laterality: N/A;   XI ROBOTIC ASSISTED LOWER ANTERIOR RESECTION N/A 05/25/2015   Procedure: XI ROBOTIC ASSISTED LOWER  ANTERIOR RESECTION, , RIGID PROCTOSCOPY, RIGHT OOPHORECTOMY;  Surgeon: [name], MD;  Location: [facility];  Service: General;  Laterality: N/A;    Medications:  Home meds:    Scheduled Meds:  docusate sodium  100 mg Oral BID   DULoxetine  120 mg Oral Daily   famotidine  20 mg Oral BID   heparin  5,000 Units Subcutaneous Q8H   levETIRAcetam  500 mg Oral BID   [START ON 02/16/2023] levothyroxine  50 mcg Oral q morning   oxybutynin  15 mg Oral QHS   polyethylene glycol  17 g Oral Daily   pregabalin  225 mg Oral BID   rosuvastatin  5 mg Oral QHS   Continuous Infusions:  cefTRIAXone (ROCEPHIN)  IV Stopped (02/15/23 0937)   lactated ringers 125 mL/hr at 02/15/23 1244   PRN Meds:.acetaminophen **OR** acetaminophen, albuterol, HYDROmorphone (DILAUDID) injection, ondansetron **OR** ondansetron (ZOFRAN) IV, traZODone  Allergies:  Allergies  Allergen Reactions  Caine-1 [Lidocaine] Swelling and Rash    Eyes swell shut; includes all caine drugs except marcaine. EMLA cream OK though    Bupropion Other (See Comments)    Not effective per Pt.    Sulfa Antibiotics Nausea And Vomiting and Rash   Pantoprazole Other (See Comments)    Not effective per Pt.    Adhesive [Tape] Rash and Other (See Comments)    Blisters - can use paper tape   Doxycycline Nausea Only   Flagyl [Metronidazole] Nausea Only   Iron Nausea Only   Metoprolol Nausea Only and Palpitations   Oxycodone Other (See Comments)    NIGHTMARES. (tolerates hydrocodone or tramadol better)   Penicillins Nausea Only and Rash    Family History  Problem Relation Age of Onset   Coronary artery disease Mother 61   Hypertension Mother    Hyperlipidemia Mother    Diabetes Mellitus I Mother    Coronary artery disease Father    Hyperlipidemia Father    Hypertension Father    Cancer Sister        skin - non melanoma   Hyperlipidemia Brother    Cancer Maternal Uncle 39       pancreatic with mets to colon and prostate   Cirrhosis  Maternal Uncle    Hypertension Maternal Grandmother    Diabetes Mellitus I Maternal Grandmother    Hyperlipidemia Maternal Grandmother    CVA Maternal Grandmother    Hypertension Maternal Grandfather    Coronary artery disease Maternal Grandfather 51   Hyperlipidemia Maternal Grandfather    Coronary artery disease Paternal Grandmother    Hypertension Paternal Grandmother    Hyperlipidemia Paternal Grandmother    Diabetes Mellitus I Paternal Grandmother    Hypertension Paternal Grandfather    Hyperlipidemia Paternal Grandfather    Coronary artery disease Paternal Grandfather     Social History:  reports that she quit smoking about 31 years ago. Her smoking use included cigarettes. She started smoking about 38 years ago. She has a 1.8 pack-year smoking history. She has never used smokeless tobacco. She reports that she does not drink alcohol and does not use drugs.  ROS: A complete review of systems was performed.  All systems are negative except for pertinent findings as noted.  Physical Exam:  Vital signs in last 24 hours: Temp:  [97.9 F (36.6 C)-100 F (37.8 C)] 98.4 F (36.9 C) (08/03 1204) Pulse Rate:  [44-131] 83 (08/03 1526) Resp:  [15-19] 16 (08/03 1526) BP: (108-148)/(67-111) 144/85 (08/03 1526) SpO2:  [91 %-100 %] 93 % (08/03 1526) Weight:  [132.9 kg] 132.9 kg (08/02 1932) Constitutional:  Alert and oriented, No acute distress Cardiovascular: Regular rate and rhythm Respiratory: Normal respiratory effort, Lungs clear bilaterally GI: Abdomen is soft, nontender, nondistended, ;left colostomy stoma bag empty Neurologic: Grossly intact, no focal deficits Psychiatric: Normal mood and affect  Laboratory Data:  Recent Labs    02/14/23 1946  WBC 9.8  HGB 12.0  HCT 38.8  PLT 341    Recent Labs    02/14/23 1946  NA 136  K 3.7  CL 101  GLUCOSE 133*  BUN 21*  CALCIUM 9.4  CREATININE 1.84*     Results for orders placed or performed during the hospital  encounter of 02/14/23 (from the past 24 hour(s))  Lipase, blood     Status: None   Collection Time: 02/14/23  7:46 PM  Result Value Ref Range   Lipase 28 11 - 51 U/L  Comprehensive metabolic panel  Status: Abnormal   Collection Time: 02/14/23  7:46 PM  Result Value Ref Range   Sodium 136 135 - 145 mmol/L   Potassium 3.7 3.5 - 5.1 mmol/L   Chloride 101 98 - 111 mmol/L   CO2 25 22 - 32 mmol/L   Glucose, Bld 133 (H) 70 - 99 mg/dL   BUN 21 (H) 6 - 20 mg/dL   Creatinine, Ser 9.62 (H) 0.44 - 1.00 mg/dL   Calcium 9.4 8.9 - 95.2 mg/dL   Total Protein 8.2 (H) 6.5 - 8.1 g/dL   Albumin 3.7 3.5 - 5.0 g/dL   AST 13 (L) 15 - 41 U/L   ALT 13 0 - 44 U/L   Alkaline Phosphatase 73 38 - 126 U/L   Total Bilirubin 0.3 0.3 - 1.2 mg/dL   GFR, Estimated 32 (L) >60 mL/min   Anion gap 10 5 - 15  CBC     Status: None   Collection Time: 02/14/23  7:46 PM  Result Value Ref Range   WBC 9.8 4.0 - 10.5 K/uL   RBC 4.58 3.87 - 5.11 MIL/uL   Hemoglobin 12.0 12.0 - 15.0 g/dL   HCT 84.1 32.4 - 40.1 %   MCV 84.7 80.0 - 100.0 fL   MCH 26.2 26.0 - 34.0 pg   MCHC 30.9 30.0 - 36.0 g/dL   RDW 02.7 25.3 - 66.4 %   Platelets 341 150 - 400 K/uL   nRBC 0.0 0.0 - 0.2 %  Urinalysis, Routine w reflex microscopic -Urine, Clean Catch     Status: Abnormal   Collection Time: 02/14/23  7:46 PM  Result Value Ref Range   Color, Urine YELLOW YELLOW   APPearance HAZY (A) CLEAR   Specific Gravity, Urine 1.018 1.005 - 1.030   pH 5.0 5.0 - 8.0   Glucose, UA NEGATIVE NEGATIVE mg/dL   Hgb urine dipstick LARGE (A) NEGATIVE   Bilirubin Urine NEGATIVE NEGATIVE   Ketones, ur NEGATIVE NEGATIVE mg/dL   Protein, ur >=403 (A) NEGATIVE mg/dL   Nitrite NEGATIVE NEGATIVE   Leukocytes,Ua MODERATE (A) NEGATIVE   RBC / HPF >50 0 - 5 RBC/hpf   WBC, UA 21-50 0 - 5 WBC/hpf   Bacteria, UA RARE (A) NONE SEEN   Squamous Epithelial / HPF 0-5 0 - 5 /HPF   Mucus PRESENT    No results found for this or any previous visit (from the past 240  hour(s)).  Renal Function: Recent Labs    02/14/23 1946  CREATININE 1.84*   Estimated Creatinine Clearance: 51.5 mL/min (A) (by C-G formula based on SCr of 1.84 mg/dL (H)).  Radiologic Imaging: CT ABDOMEN PELVIS WO CONTRAST  Result Date: 02/15/2023 CLINICAL DATA:  Bowel obstruction suspected t complaining of abdominal pain in the abdomen. Does have an colostomy which she said has had no output for the last 6 days. Sharp pain in the upper left abdomen and pain in the lower rt abdomen that radiates to the back and down the leg. EXAM: CT ABDOMEN AND PELVIS WITHOUT CONTRAST TECHNIQUE: Multidetector CT imaging of the abdomen and pelvis was performed following the standard protocol without IV contrast. RADIATION DOSE REDUCTION: This exam was performed according to the departmental dose-optimization program which includes automated exposure control, adjustment of the mA and/or kV according to patient size and/or use of iterative reconstruction technique. COMPARISON:  CT abdomen pelvis 12/19/2015, CT abdomen pelvis 06/16/2017, CT abdomen pelvis 06/18/2019, CT abdomen pelvis 10/04/2022 FINDINGS: Lower chest: No acute abnormality. Hepatobiliary: No focal liver  abnormality. No gallstones, gallbladder wall thickening, or pericholecystic fluid. No biliary dilatation. Pancreas: No focal lesion. Normal pancreatic contour. No surrounding inflammatory changes. No main pancreatic ductal dilatation. Spleen: Normal in size without focal abnormality. Adrenals/Urinary Tract: No adrenal nodule bilaterally. Bilateral ureteral stents. Right ureteral stent proximal pigtail within the right renal pelvis and distal pigtail within the urinary bladder lumen. Left ureteral stent with proximal pigtail at the left uteropelvic junction and distal pigtail within the urinary bladder lumen. Associated worsening of severe left hydronephrosis and associated persistent moderate right hydronephrosis. No nephroureterolithiasis. Hydroureter  bilaterally. No nephroureterolithiasis. The urinary bladder is unremarkable. Stomach/Bowel: Surgical changes related to a lower anterior resection and left abdominal end colostomy formation. Stomach is within normal limits. No evidence of bowel wall thickening or dilatation. Rectal stump communicates with previously identified presacral soft tissues with associated gas. Adhesive adhesive versus truncated versus communicating appendix with the presacral soft tissues in surgical site of the anterior lower resection (9:16, 7:112). The appendix appears to be thickened caliber measuring up to 14 mm. The appendiceal tip is not visualized. Fatty replacement of the appendiceal wall suggest chronic inflammatory changes. No acute inflammatory changes identified along the cecum or the base of the appendix. Vascular/Lymphatic: No abdominal aorta or iliac aneurysm. Mild atherosclerotic plaque of the aorta and its branches. No abdominal, pelvic, or inguinal lymphadenopathy. Reproductive: Hysterectomy.  No mass. Other: No intraperitoneal free fluid. No intraperitoneal free gas. No organized fluid collection. Musculoskeletal: No abdominal wall hernia or abnormality. No suspicious lytic or blastic osseous lesions. Interval increase in size of a right acetabular lytic lesion likely representing subchondral cysts/degenerative changes. No acute displaced fracture. L4-L5 posterolateral interbody surgical hardware fusion. Intervertebral disc space narrowing and endplate sclerosis at the L3-L4 level. Intervertebral disc space vacuum phenomenon at the L2-L3 level. IMPRESSION: 1. Interval worsening of severe left hydronephrosis in the setting of slight distal migration of the uterus stent with proximal pigtail at the left uteropelvic junction. 2. Persistent moderate right hydronephrosis in the setting of a right ureteral stent in appropriate position. 3. Anterior lower resection surgical changes. Rectal stump communicates with previously  identified presacral soft tissues density with associated gas. Possible associated communication with the appendix versus a truncated appendix or adhesive appendix. No definite findings of acute appendicitis. 4.  Aortic Atherosclerosis (ICD10-I70.0). 5. Limited study on this noncontrast study. 6. Interval increase in size of a right acetabular lytic lesion likely representing subchondral cysts/degenerative changes. Recommend attention on follow-up. Electronically Signed   By: Tish Frederickson M.D.   On: 02/15/2023 01:35    I independently reviewed the above imaging studies.  Impression/Recommendation Bilateral ureteral obstruction with worsening hydro on left despite dj stents.  Likely not providing optimal drainage. Options discussed.  Patient elects bilateral stent change.  Will set up for tomorrow. Also discussed that often times standard stents fail to provide adequate drainage over time.  May need to consider metalliac stents or PCN in future. Keep NPO pMN Informed consent obtained.  Joline Maxcy 02/15/2023, 4:01 PM

## 2023-02-15 NOTE — ED Notes (Signed)
ED TO INPATIENT HANDOFF REPORT  ED Nurse Name and Phone #: Lona Kettle Name/Age/Gender Lindsay Little 57 y.o. female Room/Bed: WA17/WA17  Code Status   Code Status: Full Code  Home/SNF/Other Home Patient oriented to: self, place, time, and situation Is this baseline? Yes   Triage Complete: Triage complete  Chief Complaint AKI (acute kidney injury) (HCC) [N17.9]  Triage Note Pt complaining of abdominal pain in the abdomen. Does have an colostomy which she said has had no output for the last 6 days.  Sharp pain in the upper left abdomen and pain in the lower rt abdomen that radiates to the back and down the leg.   Allergies Allergies  Allergen Reactions   Caine-1 [Lidocaine] Swelling and Rash    Eyes swell shut; includes all caine drugs except marcaine. EMLA cream OK though    Bupropion Other (See Comments)    Not effective per Pt.    Sulfa Antibiotics Nausea And Vomiting and Rash   Pantoprazole Other (See Comments)    Not effective per Pt.    Adhesive [Tape] Rash and Other (See Comments)    Blisters - can use paper tape   Doxycycline Nausea Only   Flagyl [Metronidazole] Nausea Only   Iron Nausea Only   Metoprolol Nausea Only and Palpitations   Oxycodone Other (See Comments)    NIGHTMARES. (tolerates hydrocodone or tramadol better)   Penicillins Nausea Only and Rash    Level of Care/Admitting Diagnosis ED Disposition     ED Disposition  Admit   Condition  --   Comment  Hospital Area: [facility] [[id number]]  Level of Care: Med-Surg [16]  May admit patient to [facility] or [facility] if equivalent level of care is available:: Yes  Covid Evaluation: Asymptomatic - no recent exposure (last 10 days) testing not required  Diagnosis: AKI (acute kidney injury) [facility]) [[id number]]  Admitting Physician: Maryln Gottron [[id number]]  Attending Physician: Olexa.Dam, MIR Jaxson.Roy [[id number]]  Certification:: I certify this patient will need inpatient services for at  least 2 midnights  Estimated Length of Stay: 2          B Medical/Surgery History Past Medical History:  Diagnosis Date   Anxiety    Chemotherapy-induced neuropathy (HCC)    toes and fingers numbness and tingling   Colorectal delayed anastomotic leak s/p resection & colostomy 07/12/2016 11/17/2015   Colostomy in place [facility]) 07/12/2016   due to anastomosis breakdown w/ colovaginal fistula   Colovaginal fistula s/p omentopexy repair 07/12/2016 07/10/2016   Complication of anesthesia    Depression    Family history of adverse reaction to anesthesia    mother-- ponv   Family history of breast cancer    Family history of pancreatic cancer    Family history of skin cancer    Gastroenteritis 12/20/2015   GERD (gastroesophageal reflux disease)    Hardware complicating wound infection (HCC) 08/03/2019   Hiatal hernia    History of cancer chemotherapy 02-20-2015 to 03-30-2015   History of cardiac murmur as a child    History of chemotherapy    History of chronic gastritis    History of hypertension    no issues since multiple abdminal sx's and chemo--- no medication since 12/ 2016   History of TMJ disorder    Hypercholesteremia    Hypertension    IBS (irritable bowel syndrome)    dx age 66   Intermittent abdominal pain    post-op multiple abdominal sx's  Microcytic anemia    Mild sleep apnea    per pt study 2014  very mild osa , no cpap recommended, recommeded wt loss and sleep routine   OA (osteoarthritis)    left knee /  left shoulder   Obesity    Parastomal hernia s/p lap repair w mesh 07/23/2018 07/23/2018   Pelvic abscess s/p drainage & omental pedicle flap 11/17/2015 06/23/2015   PONV (postoperative nausea and vomiting)    severe" needs Scopolamine PATCH"    Portacath in place    right chest   Rectal adenocarcinoma (HCC) oncologis-  dr Mosetta Putt-- after radiation/ chemo (ypT1, N0) --  no recurrence per last note 03/ 2018   dx 01-13-2015-- Stage IIIC (T3, N2, M0) post concurrent  radiation and chemotherapy 02-20-2015 to 03-30-2015 /  05-25-2015 s/p  LAR w/ RSO (post-op complicated by late abscess and contained anatomotic leak w/ help percutaneous drainage and antibiotics)    Rectocutaneous fistula 07/10/2016   Rotator cuff tear, left    S/P radiation therapy 02/20/15-03/30/15   colon/rectal   Vitamin D deficiency    Wears glasses    Wears glasses    Past Surgical History:  Procedure Laterality Date   ABDOMINAL HYSTERECTOMY  1996   uterus and cervix   BACK SURGERY  02/12/2018   lumbar surgery   COLON RESECTION N/A 07/12/2016   Procedure: LAPAROSCOPIC LYSIS OF ADHESIONS, OMENTOPEXY, HAND ASSISTED RESECTION OF  COLON, END TO END ANASTOMOSIS, COLOSTOMY;  Surgeon: [name], MD;  Location: [facility];  Service: General;  Laterality: N/A;   COMBINED HYSTEROSCOPY DIAGNOSTIC / D&C  x2 1990's   DIAGNOSTIC LAPAROSCOPY  age 83 and age 77   EUS N/A 01/18/2015   Procedure: LOWER ENDOSCOPIC ULTRASOUND (EUS);  Surgeon: Willis Modena, MD;  Location: [facility];  Service: Endoscopy;  Laterality: N/A;   EXCISION OF SKIN TAG  11/17/2015   Procedure: EXCISION OF SKIN TAG;  Surgeon: [name], MD;  Location: [facility];  Service: General;;   ILEO LOOP COLOSTOMY CLOSURE N/A 11/17/2015   Procedure: LAPAROSCOPIC DIVERTING LOOP ILEOSTOMY  DRAINAGE OF PELVIC ABSCESS;  Surgeon: [name], MD;  Location: [facility];  Service: General;  Laterality: N/A;   ILEOSTOMY CLOSURE N/A 05/31/2016   Procedure: TAKEDOWN LOOP ILEOSTOMY;  Surgeon: [name], MD;  Location: [facility];  Service: General;  Laterality: N/A;   IMPACTION REMOVAL  11/17/2015   Procedure: DISIMPACTION REMOVAL;  Surgeon: [name], MD;  Location: [facility];  Service: General;;   INSERTION OF MESH N/A 07/23/2018   Procedure: INSERTION OF MESH X2;  Surgeon: [name], MD;  Location: [facility];  Service: General;  Laterality: N/A;   IR LUMBAR DISC ASPIRATION W/IMG GUIDE  07/01/2019   KNEE ARTHROSCOPY Left 1990's   KNEE ARTHROSCOPY W/  MENISCECTOMY Left 09/14/2009   and chondroplasty debridement   LAPAROSCOPIC LYSIS OF ADHESIONS  11/17/2015   Procedure: LAPAROSCOPIC LYSIS OF ADHESIONS;  Surgeon: [name], MD;  Location: [facility];  Service: General;;   LUMBAR WOUND DEBRIDEMENT N/A 04/24/2018   Procedure: wound exploration, irrigation and debridement;  Surgeon: Tia Alert, MD;  Location: [facility];  Service: Neurosurgery;  Laterality: N/A;  wound exploration, irrigation and debridement   LYSIS OF ADHESION N/A 07/23/2018   Procedure: LYSIS OF ADHESIONS;  Surgeon: [name], MD;  Location: [facility];  Service: General;  Laterality: N/A;   PORT-A-CATH REMOVAL N/A 11/21/2016   Procedure: REMOVAL PORT-A-CATH;  Surgeon: [name], MD;  Location: [facility];  Service:  General;  Laterality: N/A;   PORTACATH PLACEMENT N/A 05/25/2015   Procedure: INSERTION PORT-A-CATH;  Surgeon: [name], MD;  Location: [facility];  Service: General;  Laterality: N/A;-remains inplace Right chest.   ROTATOR CUFF REPAIR Right 2006   TONSILLECTOMY  age 89   VENTRAL HERNIA REPAIR N/A 07/23/2018   Procedure: LAPAROSCOPIC VENTRAL WALL HERNIA REPAIR;  Surgeon: [name], MD;  Location: [facility];  Service: General;  Laterality: N/A;   XI ROBOTIC ASSISTED LOWER ANTERIOR RESECTION N/A 05/25/2015   Procedure: XI ROBOTIC ASSISTED LOWER ANTERIOR RESECTION, , RIGID PROCTOSCOPY, RIGHT OOPHORECTOMY;  Surgeon: [name], MD;  Location: [facility];  Service: General;  Laterality: N/A;     A IV Location/Drains/Wounds Patient Lines/Drains/Airways Status     Active Line/Drains/Airways     Name Placement date Placement time Site Days   Peripheral IV 02/15/23 20 G Right Antecubital 02/15/23  0850  Antecubital  less than 1   Colostomy LUQ 07/12/16  1732  LUQ  2409   Colostomy LUQ --  --  LUQ  --            Intake/Output Last 24 hours No intake or output data in the 24 hours ending 02/15/23 1349  Labs/Imaging Results for orders placed or  performed during the hospital encounter of 02/14/23 (from the past 48 hour(s))  Lipase, blood     Status: None   Collection Time: 02/14/23  7:46 PM  Result Value Ref Range   Lipase 28 11 - 51 U/L    Comment: Performed at [facility], [address], [place], [place] [address]  Comprehensive metabolic panel     Status: Abnormal   Collection Time: 02/14/23  7:46 PM  Result Value Ref Range   Sodium 136 135 - 145 mmol/L   Potassium 3.7 3.5 - 5.1 mmol/L   Chloride 101 98 - 111 mmol/L   CO2 25 22 - 32 mmol/L   Glucose, Bld 133 (H) 70 - 99 mg/dL    Comment: Glucose reference range applies only to samples taken after fasting for at least 8 hours.   BUN 21 (H) 6 - 20 mg/dL   Creatinine, Ser 6.04 (H) 0.44 - 1.00 mg/dL   Calcium 9.4 8.9 - 54.0 mg/dL   Total Protein 8.2 (H) 6.5 - 8.1 g/dL   Albumin 3.7 3.5 - 5.0 g/dL   AST 13 (L) 15 - 41 U/L   ALT 13 0 - 44 U/L   Alkaline Phosphatase 73 38 - 126 U/L   Total Bilirubin 0.3 0.3 - 1.2 mg/dL   GFR, Estimated 32 (L) >60 mL/min    Comment: (NOTE) Calculated using the CKD-EPI Creatinine Equation (2021)    Anion gap 10 5 - 15    Comment: Performed at [facility], [address] [place], [place] [address]  CBC     Status: None   Collection Time: 02/14/23  7:46 PM  Result Value Ref Range   WBC 9.8 4.0 - 10.5 K/uL   RBC 4.58 3.87 - 5.11 MIL/uL   Hemoglobin 12.0 12.0 - 15.0 g/dL   HCT 14.7 82.9 - 56.2 %   MCV 84.7 80.0 - 100.0 fL   MCH 26.2 26.0 - 34.0 pg   MCHC 30.9 30.0 - 36.0 g/dL   RDW 13.0 86.5 - 78.4 %   Platelets 341 150 - 400 K/uL   nRBC 0.0 0.0 - 0.2 %    Comment: Performed at [facility], [address] [place], [place] [address]  Urinalysis, Routine w reflex microscopic -Urine, Clean Catch     Status: Abnormal   Collection Time: 02/14/23  7:46 PM  Result Value Ref Range   Color, Urine YELLOW YELLOW   APPearance HAZY (A) CLEAR   Specific Gravity, Urine 1.018 1.005 - 1.030    pH 5.0 5.0 - 8.0   Glucose, UA NEGATIVE NEGATIVE mg/dL   Hgb urine dipstick LARGE (A) NEGATIVE   Bilirubin Urine NEGATIVE NEGATIVE   Ketones, ur NEGATIVE NEGATIVE mg/dL   Protein, ur >=962 (A) NEGATIVE mg/dL   Nitrite NEGATIVE NEGATIVE   Leukocytes,Ua MODERATE (A) NEGATIVE   RBC / HPF >50 0 - 5 RBC/hpf   WBC, UA 21-50 0 - 5 WBC/hpf   Bacteria, UA RARE (A) NONE SEEN   Squamous Epithelial / HPF 0-5 0 - 5 /HPF   Mucus PRESENT     Comment: Performed at [facility], [address] [place], [place] [address]   CT ABDOMEN PELVIS WO CONTRAST  Result Date: 02/15/2023 CLINICAL DATA:  Bowel obstruction suspected t complaining of abdominal pain in the abdomen. Does have an colostomy which she said has had no output for the last 6 days. Sharp pain in the upper left abdomen and pain in the lower rt abdomen that radiates to the back and down the leg. EXAM: CT ABDOMEN AND PELVIS WITHOUT CONTRAST TECHNIQUE: Multidetector CT imaging of the abdomen and pelvis was performed following the standard protocol without IV contrast. RADIATION DOSE REDUCTION: This exam was performed according to the departmental dose-optimization program which includes automated exposure control, adjustment of the mA and/or kV according to patient size and/or use of iterative reconstruction technique. COMPARISON:  CT abdomen pelvis 12/19/2015, CT abdomen pelvis 06/16/2017, CT abdomen pelvis 06/18/2019, CT abdomen pelvis 10/04/2022 FINDINGS: Lower chest: No acute abnormality. Hepatobiliary: No focal liver abnormality. No gallstones, gallbladder wall thickening, or pericholecystic fluid. No biliary dilatation. Pancreas: No focal lesion. Normal pancreatic contour. No surrounding inflammatory changes. No main pancreatic ductal dilatation. Spleen: Normal in size without focal abnormality. Adrenals/Urinary Tract: No adrenal nodule bilaterally. Bilateral ureteral stents. Right ureteral stent proximal pigtail within the right  renal pelvis and distal pigtail within the urinary bladder lumen. Left ureteral stent with proximal pigtail at the left uteropelvic junction and distal pigtail within the urinary bladder lumen. Associated worsening of severe left hydronephrosis and associated persistent moderate right hydronephrosis. No nephroureterolithiasis. Hydroureter bilaterally. No nephroureterolithiasis. The urinary bladder is unremarkable. Stomach/Bowel: Surgical changes related to a lower anterior resection and left abdominal end colostomy formation. Stomach is within normal limits. No evidence of bowel wall thickening or dilatation. Rectal stump communicates with previously identified presacral soft tissues with associated gas. Adhesive adhesive versus truncated versus communicating appendix with the presacral soft tissues in surgical site of the anterior lower resection (9:16, 7:112). The appendix appears to be thickened caliber measuring up to 14 mm. The appendiceal tip is not visualized. Fatty replacement of the appendiceal wall suggest chronic inflammatory changes. No acute inflammatory changes identified along the cecum or the base of the appendix. Vascular/Lymphatic: No abdominal aorta or iliac aneurysm. Mild atherosclerotic plaque of the aorta and its branches. No abdominal, pelvic, or inguinal lymphadenopathy. Reproductive: Hysterectomy.  No mass. Other: No intraperitoneal free fluid. No intraperitoneal free gas. No organized fluid collection. Musculoskeletal: No abdominal wall hernia or abnormality. No suspicious lytic or blastic osseous lesions. Interval increase in size of a right acetabular lytic lesion likely representing subchondral cysts/degenerative changes. No acute displaced  fracture. L4-L5 posterolateral interbody surgical hardware fusion. Intervertebral disc space narrowing and endplate sclerosis at the L3-L4 level. Intervertebral disc space vacuum phenomenon at the L2-L3 level. IMPRESSION: 1. Interval worsening of  severe left hydronephrosis in the setting of slight distal migration of the uterus stent with proximal pigtail at the left uteropelvic junction. 2. Persistent moderate right hydronephrosis in the setting of a right ureteral stent in appropriate position. 3. Anterior lower resection surgical changes. Rectal stump communicates with previously identified presacral soft tissues density with associated gas. Possible associated communication with the appendix versus a truncated appendix or adhesive appendix. No definite findings of acute appendicitis. 4.  Aortic Atherosclerosis (ICD10-I70.0). 5. Limited study on this noncontrast study. 6. Interval increase in size of a right acetabular lytic lesion likely representing subchondral cysts/degenerative changes. Recommend attention on follow-up. Electronically Signed   By: Tish Frederickson M.D.   On: 02/15/2023 01:35    Pending Labs Unresulted Labs (From admission, onward)     Start     Ordered   02/16/23 0500  Basic metabolic panel  Tomorrow morning,   R        02/15/23 0846   02/16/23 0500  CBC  Tomorrow morning,   R        02/15/23 0846   02/15/23 0902  Urine Culture (for pregnant, neutropenic or urologic patients or patients with an indwelling urinary catheter)  (Urine Labs)  Once,   R       Question:  Indication  Answer:  Flank Pain   02/15/23 0902            Vitals/Pain Today's Vitals   02/15/23 1030 02/15/23 1100 02/15/23 1204 02/15/23 1300  BP: (!) 119/104 (!) 115/91 121/80 124/70  Pulse: 62 72 65 (!) 58  Resp: 18 18 15 16   Temp:   98.4 F (36.9 C)   TempSrc:      SpO2: 91% 93% 99% 100%  Weight:      Height:      PainSc:        Isolation Precautions No active isolations  Medications Medications  lactated ringers infusion ( Intravenous New Bag/Given 02/15/23 1244)  cefTRIAXone (ROCEPHIN) 1 g in sodium chloride 0.9 % 100 mL IVPB (0 g Intravenous Stopped 02/15/23 0937)  levETIRAcetam (KEPPRA) tablet 500 mg (500 mg Oral Given 02/15/23  1243)  pregabalin (LYRICA) capsule 225 mg (225 mg Oral Given 02/15/23 1242)  oxybutynin (DITROPAN XL) 24 hr tablet 15 mg (has no administration in time range)  famotidine (PEPCID) tablet 20 mg (20 mg Oral Given 02/15/23 1244)  polyethylene glycol (MIRALAX / GLYCOLAX) packet 17 g (17 g Oral Given 02/15/23 1243)  docusate sodium (COLACE) capsule 100 mg (100 mg Oral Given 02/15/23 1243)  levothyroxine (SYNTHROID) tablet 50 mcg (has no administration in time range)  DULoxetine (CYMBALTA) DR capsule 120 mg (120 mg Oral Given 02/15/23 1242)  rosuvastatin (CRESTOR) tablet 5 mg (has no administration in time range)  heparin injection 5,000 Units (5,000 Units Subcutaneous Given 02/15/23 1049)  acetaminophen (TYLENOL) tablet 650 mg (has no administration in time range)    Or  acetaminophen (TYLENOL) suppository 650 mg (has no administration in time range)  HYDROmorphone (DILAUDID) injection 0.5-1 mg (1 mg Intravenous Given 02/15/23 1243)  traZODone (DESYREL) tablet 25 mg (has no administration in time range)  ondansetron (ZOFRAN) tablet 4 mg (has no administration in time range)    Or  ondansetron (ZOFRAN) injection 4 mg (has no administration in time range)  albuterol (PROVENTIL) (  2.5 MG/3ML) 0.083% nebulizer solution 2.5 mg (has no administration in time range)  lactated ringers bolus 2,000 mL (0 mLs Intravenous Stopped 02/15/23 1219)  morphine (PF) 4 MG/ML injection 8 mg (8 mg Intravenous Given 02/15/23 0850)    Mobility walks     Focused Assessments UTI   R Recommendations: See Admitting Provider Note  Report given to:   Additional Notes: .

## 2023-02-16 ENCOUNTER — Encounter (HOSPITAL_COMMUNITY)
Admission: EM | Disposition: A | Discharge status: Home / Self Care | Payer: Self-pay | Source: Home / Self Care | Attending: Internal Medicine

## 2023-02-16 ENCOUNTER — Inpatient Hospital Stay (HOSPITAL_COMMUNITY): Payer: Medicare HMO

## 2023-02-16 ENCOUNTER — Inpatient Hospital Stay (HOSPITAL_COMMUNITY): Payer: Medicare HMO | Admitting: Anesthesiology

## 2023-02-16 DIAGNOSIS — N131 Hydronephrosis with ureteral stricture, not elsewhere classified: Secondary | ICD-10-CM | POA: Diagnosis not present

## 2023-02-16 DIAGNOSIS — Z87891 Personal history of nicotine dependence: Secondary | ICD-10-CM

## 2023-02-16 DIAGNOSIS — I129 Hypertensive chronic kidney disease with stage 1 through stage 4 chronic kidney disease, or unspecified chronic kidney disease: Secondary | ICD-10-CM | POA: Diagnosis not present

## 2023-02-16 DIAGNOSIS — N189 Chronic kidney disease, unspecified: Secondary | ICD-10-CM | POA: Diagnosis not present

## 2023-02-16 DIAGNOSIS — N179 Acute kidney failure, unspecified: Secondary | ICD-10-CM

## 2023-02-16 HISTORY — PX: CYSTOSCOPY W/ URETERAL STENT PLACEMENT: SHX1429

## 2023-02-16 LAB — CBC
HCT: 37.6 % (ref 36.0–46.0)
Hemoglobin: 11.2 g/dL — ABNORMAL LOW (ref 12.0–15.0)
MCH: 25.9 pg — ABNORMAL LOW (ref 26.0–34.0)
MCHC: 29.8 g/dL — ABNORMAL LOW (ref 30.0–36.0)
MCV: 87 fL (ref 80.0–100.0)
Platelets: 276 10*3/uL (ref 150–400)
RBC: 4.32 MIL/uL (ref 3.87–5.11)
RDW: 15 % (ref 11.5–15.5)
WBC: 8 10*3/uL (ref 4.0–10.5)
nRBC: 0 % (ref 0.0–0.2)

## 2023-02-16 LAB — URINE CULTURE

## 2023-02-16 LAB — BASIC METABOLIC PANEL WITH GFR
Anion gap: 10 (ref 5–15)
BUN: 16 mg/dL (ref 6–20)
CO2: 26 mmol/L (ref 22–32)
Calcium: 9.4 mg/dL (ref 8.9–10.3)
Chloride: 100 mmol/L (ref 98–111)
Creatinine, Ser: 1.33 mg/dL — ABNORMAL HIGH (ref 0.44–1.00)
GFR, Estimated: 47 mL/min — ABNORMAL LOW (ref >60)
Glucose, Bld: 166 mg/dL — ABNORMAL HIGH (ref 70–99)
Potassium: 4.6 mmol/L (ref 3.5–5.1)
Sodium: 136 mmol/L (ref 135–145)

## 2023-02-16 SURGERY — CYSTOSCOPY, WITH RETROGRADE PYELOGRAM AND URETERAL STENT INSERTION
Anesthesia: General | Site: Ureter | Laterality: Bilateral

## 2023-02-16 MED ORDER — PROPOFOL 10 MG/ML IV BOLUS
INTRAVENOUS | Status: AC
  Filled 2023-02-16: qty 20

## 2023-02-16 MED ORDER — DEXAMETHASONE SODIUM PHOSPHATE 10 MG/ML IJ SOLN
INTRAMUSCULAR | Status: DC | PRN
  Administered 2023-02-16: 8 mg via INTRAVENOUS

## 2023-02-16 MED ORDER — MIDAZOLAM HCL 2 MG/2ML IJ SOLN
INTRAMUSCULAR | Status: DC | PRN
  Administered 2023-02-16: 2 mg via INTRAVENOUS

## 2023-02-16 MED ORDER — ONDANSETRON HCL 4 MG/2ML IJ SOLN
INTRAMUSCULAR | Status: DC | PRN
  Administered 2023-02-16: 4 mg via INTRAVENOUS

## 2023-02-16 MED ORDER — POLYETHYLENE GLYCOL 3350 17 G PO PACK
17.0000 g | PACK | Freq: Three times a day (TID) | ORAL | Status: DC
  Administered 2023-02-16 – 2023-02-18 (×6): 17 g via ORAL
  Filled 2023-02-16 (×6): qty 1

## 2023-02-16 MED ORDER — MIDAZOLAM HCL 2 MG/2ML IJ SOLN
INTRAMUSCULAR | Status: AC
  Filled 2023-02-16: qty 2

## 2023-02-16 MED ORDER — DEXAMETHASONE SODIUM PHOSPHATE 10 MG/ML IJ SOLN
INTRAMUSCULAR | Status: AC
  Filled 2023-02-16: qty 1

## 2023-02-16 MED ORDER — FENTANYL CITRATE (PF) 100 MCG/2ML IJ SOLN
INTRAMUSCULAR | Status: AC
  Filled 2023-02-16: qty 2

## 2023-02-16 MED ORDER — SCOPOLAMINE 1 MG/3DAYS TD PT72
MEDICATED_PATCH | TRANSDERMAL | Status: AC
  Filled 2023-02-16: qty 1

## 2023-02-16 MED ORDER — ONDANSETRON HCL 4 MG/2ML IJ SOLN
INTRAMUSCULAR | Status: AC
  Filled 2023-02-16: qty 2

## 2023-02-16 MED ORDER — SODIUM CHLORIDE 0.9 % IV SOLN
INTRAVENOUS | Status: AC
  Filled 2023-02-16: qty 10

## 2023-02-16 MED ORDER — ESMOLOL HCL 100 MG/10ML IV SOLN
INTRAVENOUS | Status: AC
  Filled 2023-02-16: qty 20

## 2023-02-16 MED ORDER — FENTANYL CITRATE PF 50 MCG/ML IJ SOSY: 25.0000 ug | PREFILLED_SYRINGE | INTRAMUSCULAR | Status: DC | PRN

## 2023-02-16 MED ORDER — TAMSULOSIN HCL 0.4 MG PO CAPS
0.4000 mg | ORAL_CAPSULE | Freq: Every day | ORAL | Status: DC
  Administered 2023-02-16 – 2023-02-18 (×3): 0.4 mg via ORAL
  Filled 2023-02-16 (×3): qty 1

## 2023-02-16 MED ORDER — SODIUM CHLORIDE 0.9 % IR SOLN
Status: DC | PRN
  Administered 2023-02-16: 3000 mL via INTRAVESICAL

## 2023-02-16 MED ORDER — SUCCINYLCHOLINE CHLORIDE 200 MG/10ML IV SOSY
PREFILLED_SYRINGE | INTRAVENOUS | Status: DC | PRN
  Administered 2023-02-16: 140 mg via INTRAVENOUS

## 2023-02-16 MED ORDER — SCOPOLAMINE 1 MG/3DAYS TD PT72
MEDICATED_PATCH | TRANSDERMAL | Status: DC | PRN
  Administered 2023-02-16: 1 via TRANSDERMAL

## 2023-02-16 MED ORDER — ACETAMINOPHEN 10 MG/ML IV SOLN: 1000.0000 mg | Freq: Once | INTRAVENOUS | Status: DC | PRN

## 2023-02-16 MED ORDER — PROPOFOL 10 MG/ML IV BOLUS
INTRAVENOUS | Status: DC | PRN
Start: 2023-02-16 — End: 2023-02-16
  Administered 2023-02-16: 200 mg via INTRAVENOUS
  Administered 2023-02-16: 50 mg via INTRAVENOUS

## 2023-02-16 MED ORDER — ROCURONIUM BROMIDE 10 MG/ML (PF) SYRINGE
PREFILLED_SYRINGE | INTRAVENOUS | Status: AC
  Filled 2023-02-16: qty 10

## 2023-02-16 MED ORDER — AMISULPRIDE (ANTIEMETIC) 5 MG/2ML IV SOLN: 10.0000 mg | Freq: Once | INTRAVENOUS | Status: DC | PRN

## 2023-02-16 MED ORDER — FENTANYL CITRATE (PF) 250 MCG/5ML IJ SOLN
INTRAMUSCULAR | Status: DC | PRN
  Administered 2023-02-16: 50 ug via INTRAVENOUS
  Administered 2023-02-16: 100 ug via INTRAVENOUS

## 2023-02-16 MED ORDER — PROPOFOL 1000 MG/100ML IV EMUL
INTRAVENOUS | Status: AC
  Filled 2023-02-16: qty 100

## 2023-02-16 MED ORDER — SENNOSIDES-DOCUSATE SODIUM 8.6-50 MG PO TABS
2.0000 | ORAL_TABLET | Freq: Two times a day (BID) | ORAL | Status: DC
  Administered 2023-02-16 – 2023-02-18 (×5): 2 via ORAL
  Filled 2023-02-16 (×5): qty 2

## 2023-02-16 MED ORDER — BISOPROLOL FUMARATE 5 MG PO TABS
5.0000 mg | ORAL_TABLET | Freq: Every day | ORAL | Status: DC
  Administered 2023-02-16 – 2023-02-18 (×2): 5 mg via ORAL
  Filled 2023-02-16 (×3): qty 1

## 2023-02-16 SURGICAL SUPPLY — 15 items
BAG URO CATCHER STRL LF (MISCELLANEOUS) ×1 IMPLANT
CATH URETL OPEN END 6FR 70 (CATHETERS) IMPLANT
CLOTH BEACON ORANGE TIMEOUT ST (SAFETY) ×1 IMPLANT
GLOVE SURG LX STRL 7.5 STRW (GLOVE) ×1 IMPLANT
GOWN STRL REUS W/ TWL XL LVL3 (GOWN DISPOSABLE) ×1 IMPLANT
GOWN STRL REUS W/TWL XL LVL3 (GOWN DISPOSABLE) ×1
GUIDEWIRE STR DUAL SENSOR (WIRE) ×1 IMPLANT
GUIDEWIRE ZIPWRE .038 STRAIGHT (WIRE) IMPLANT
KIT TURNOVER KIT A (KITS) IMPLANT
MANIFOLD NEPTUNE II (INSTRUMENTS) ×1 IMPLANT
PACK CYSTO (CUSTOM PROCEDURE TRAY) ×1 IMPLANT
STENT URET 6FRX24 CONTOUR (STENTS) IMPLANT
STENT URET 6FRX26 CONTOUR (STENTS) IMPLANT
TUBING CONNECTING 10 (TUBING) ×1 IMPLANT
TUBING UROLOGY SET (TUBING) IMPLANT

## 2023-02-16 NOTE — Anesthesia Preprocedure Evaluation (Addendum)
Anesthesia Evaluation  Patient identified by MRN, date of birth, ID band Patient awake    Reviewed: Allergy & Precautions, NPO status , Patient's Chart, lab work & pertinent test results  History of Anesthesia Complications (+) PONV, Family history of anesthesia reaction and history of anesthetic complications  Airway Mallampati: III  TM Distance: >3 FB Neck ROM: Full   Comment: Previous grade I view with MAC 3 Dental  (+) Dental Advisory Given   Pulmonary neg shortness of breath, sleep apnea (mild, does not require CPAP) , neg COPD, Recent URI  (4 weeks ago), Resolved, former smoker   Pulmonary exam normal breath sounds clear to auscultation       Cardiovascular hypertension, (-) angina (-) Past MI, (-) Cardiac Stents and (-) CABG (-) dysrhythmias + Valvular Problems/Murmurs (murmur as a child)  Rhythm:Regular Rate:Normal  HLD  TTE 02/02/2018: Impressions:   - LVEF 50-55%, mild LVH, normal wall motion, grade 1 DD,    indeterminate LV filling pressure, trivial MR, normal LA Size,    normal IVC.     Neuro/Psych neg Seizures (reports she has never had a seizure, Keppra is for her pain) PSYCHIATRIC DISORDERS Anxiety Depression    Chronic pain (takes MS Contin 15 mg q12h), s/p lumbar fusion  Neuromuscular disease (chemo-induced neuropathy)    GI/Hepatic hiatal hernia (s/p repair),GERD  Medicated,,Liver hemangioma Rectal adenocarcinoma s/p chemo/radiation and LAR with RSO, colostomy, Crohn's disease   Endo/Other  neg diabetesHypothyroidism  Morbid obesity  Renal/GU ARFRenal disease (ureteral stricture)     Musculoskeletal  (+) Arthritis , Osteoarthritis,    Abdominal  (+) + obese  Peds  Hematology  (+) Blood dyscrasia, anemia   Anesthesia Other Findings 57 y.o. with metastatic rectal ca and bilateral ureteral strictures/obstruction that has been managed with bilateral dj stents (Dr. Logan B[facility] at [facility]).  Last changed  12/2015.  Patient currently admitted with left abd and epigastric pain with Ct showing worsening left hydronephrosis  Nausea. Has not had any ostomy output in 9 days per patient.  TMJ  Reproductive/Obstetrics                             Anesthesia Physical Anesthesia Plan  ASA: 3  Anesthesia Plan: General   Post-op Pain Management:    Induction: Intravenous and Rapid sequence  PONV Risk Score and Plan: 4 or greater and Ondansetron, Dexamethasone, Midazolam and Treatment may vary due to age or medical condition  Airway Management Planned: Oral ETT  Additional Equipment:   Intra-op Plan:   Post-operative Plan: Extubation in OR  Informed Consent: I have reviewed the patients History and Physical, chart, labs and discussed the procedure including the risks, benefits and alternatives for the proposed anesthesia with the patient or authorized representative who has indicated his/her understanding and acceptance.     Dental advisory given  Plan Discussed with: CRNA and Anesthesiologist  Anesthesia Plan Comments: (Risks of general anesthesia discussed including, but not limited to, sore throat, hoarse voice, chipped/damaged teeth, injury to vocal cords, nausea and vomiting, allergic reactions, lung infection, heart attack, stroke, and death. All questions answered. )        Anesthesia Quick Evaluation

## 2023-02-16 NOTE — Transfer of Care (Signed)
Immediate Anesthesia Transfer of Care Note  Patient: Lindsay Little  Procedure(s) Performed: CYSTOSCOPY WITH RETROGRADE PYELOGRAM BILATERAL STENT EXCHANGE (Bilateral)  Patient Location: PACU  Anesthesia Type:General  Level of Consciousness: drowsy and patient cooperative  Airway & Oxygen Therapy: Patient Spontanous Breathing and Patient connected to face mask oxygen  Post-op Assessment: Report given to RN and Post -op Vital signs reviewed and stable  Post vital signs: Reviewed and stable  Last Vitals:  Vitals Value Taken Time  BP 185/80 02/16/23 1015  Temp 36.4 C 02/16/23 1013  Pulse 98 02/16/23 1022  Resp 9 02/16/23 1022  SpO2 97 % 02/16/23 1022  Vitals shown include unfiled device data.  Last Pain:  Vitals:   02/16/23 1013  TempSrc:   PainSc: Asleep         Complications: No notable events documented.

## 2023-02-16 NOTE — Plan of Care (Signed)
  Problem: Education: Goal: Knowledge of General Education information will improve Description Including pain rating scale, medication(s)/side effects and non-pharmacologic comfort measures Outcome: Progressing   Problem: Health Behavior/Discharge Planning: Goal: Ability to manage health-related needs will improve Outcome: Progressing   

## 2023-02-16 NOTE — Anesthesia Postprocedure Evaluation (Signed)
Anesthesia Post Note  Patient: Lindsay Little  Procedure(s) Performed: CYSTOSCOPY WITH RETROGRADE PYELOGRAM BILATERAL STENT EXCHANGE (Bilateral: Ureter)     Patient location during evaluation: PACU Anesthesia Type: General Level of consciousness: awake Pain management: pain level controlled Vital Signs Assessment: post-procedure vital signs reviewed and stable Respiratory status: spontaneous breathing, nonlabored ventilation and respiratory function stable Cardiovascular status: blood pressure returned to baseline and stable Postop Assessment: no apparent nausea or vomiting Anesthetic complications: no   No notable events documented.  Last Vitals:  Vitals:   02/16/23 1029 02/16/23 1038  BP:  (!) 129/93  Pulse: 98 94  Resp: 11 11  Temp:    SpO2: 98% 94%    Last Pain:  Vitals:   02/16/23 1110  TempSrc:   PainSc: 8                  Linton Rump

## 2023-02-16 NOTE — Anesthesia Procedure Notes (Addendum)
Procedure Name: Intubation Date/Time: 02/16/2023 9:35 AM  Performed by: Oletha Cruel, CRNAPre-anesthesia Checklist: Emergency Drugs available, Patient identified, Suction available and Patient being monitored Patient Re-evaluated:Patient Re-evaluated prior to induction Oxygen Delivery Method: Circle system utilized Preoxygenation: Pre-oxygenation with 100% oxygen Induction Type: IV induction Ventilation: Mask ventilation without difficulty Laryngoscope Size: Mac and 4 Grade View: Grade I Tube type: Oral Number of attempts: 1 Airway Equipment and Method: Stylet Placement Confirmation: ETT inserted through vocal cords under direct vision, positive ETCO2, CO2 detector and breath sounds checked- equal and bilateral Secured at: 21 (Positioned 21 cm at the upper lip.) cm Tube secured with: Tape Dental Injury: Teeth and Oropharynx as per pre-operative assessment

## 2023-02-16 NOTE — Plan of Care (Signed)

## 2023-02-16 NOTE — Addendum Note (Signed)
Addendum  created 02/16/23 1449 by Oletha Cruel, CRNA   Flowsheet accepted, Intraprocedure Meds edited

## 2023-02-16 NOTE — Op Note (Signed)
OPERATIVE NOTE   Patient Name: Lindsay Little  MRN: [id number]   Date of Procedure: 02/16/2023   Preoperative diagnosis:  Bilateral ureteral obstruction  Postoperative diagnosis:  same  Procedure:  Cysto with bilateral stent exchange  Attending: Marcha Solders, MD  Anesthesia: general  Estimated blood loss: min  Fluids: Per anesthesia record  Drains: right 6x24 and left 6x26 DJ stents  Specimens: old stents removed  Antibiotics: rocephin  Findings: bilateral dj stents  Indications:  Bilateral ureteral obstruction  Description of Procedure:  The patient was brought to the operating room where she was correctly identified and positioned in the supine position and underwent satisfactory induction of general anesthesia.  She was then positioned in the modified dorsolithotomy position and her external genitalia were prepped and draped in usual fashion.  A 23 French panendoscope was subsequently inserted per urethra.  There were noted to be 2 double-J stents coming from each ureteral opening.  The right 1 was clearly too long and extended down and obviously protruded through the bladder neck.  Prior op note stated that they were both 626 stents.  The left stent appeared to be appropriate. There were no noted bladder lesions.  At this time a begin on the right and grasped the right stent and brought out to the urethral meatus.  A safety wire was then inserted through the stent under fluoroscopic control to coil within the right renal collecting system.  The old stent was subsequently removed and I then placed a 6 x 24 cm stent with tether removed with proper position obtained proximally and distally.  At this time I turned my attention to the left side in similar fashion grasped the existing stent and burred out to the urethral meatus.  Safety wire was placed through the existing stent to coil within the renal collecting system and old stent was removed both stents actually appear  to be in fairly good condition there was minimal encrustation.  Over the 50 wire on the left side I then placed a new 6 x 26 cm double-J stent with tether removed and again under fluoroscopic and endoscopic control obtained good position proximally and distally.  At this time the bladder was drained and the procedure terminated.  Patient tolerated the procedure well.  No apparent complications.  Complications: None  Condition: Stable, extubated, transferred to PACU  Plan: FU with established urologist

## 2023-02-16 NOTE — Progress Notes (Signed)
PROGRESS NOTE    Lindsay Little  ZOX:[id number] DOB: 07/03/1966 DOA: 02/14/2023 PCP: Laurann Montana, MD    Brief Narrative:   Lindsay Little is a 57 y.o. female with past medical history significant for HTN, HLD, rectal cancer s/p resection/colostomy, chronic abdominal pain, history of multiple intra-abdominal abscesses and recurrent SBO who presented to [facility] ED on 8/2 with complaints of abdominal pain.  Pain localized to the left upper abdomen and right lower abdomen that is sharp with radiation to the back and down her leg.  Patient reports that she has been eating normally but had developed some nausea.  When symptoms progressed she sought further care in the ED.  Reports no bowel movement 7 days prior to arrival.  Over the last 5 days she has been taking MiraLAX twice daily.  Denies vomiting, no fever/chills, no chest pain, no urinary symptoms.  In the ED, temperature 100.0 F, HR 119, RR 18, BP 142/94, SpO2 96% on room air.  WBC 9.8, hemoglobin 12.0, platelets 341.  Sodium 136, potassium 3.7, chloride 101, CO2 25, glucose 133, BUN 21, creatinine 1.84.  Lipase 28, AST 13, ALT 13, total bilirubin 0.3.  Urinalysis with moderate leukocytes, negative nitrite, rare bacteria, 21-50 WBCs.  Urine culture obtained.  CT abdomen/pelvis with interval worsening severe left hydronephrosis with slight distal migration of the ureteral stent with proximal pigtail at the left ureteropelvic junction, persistent moderate right hydronephrosis with right ureteral stent in appropriate position, anterior lower resection surgical changes, rectal stump communicates with previously identified presacral soft tissue density with associated gas, possible associated communication with the appendix versus truncated appendix or adhesive appendix, no definitive findings of acute appendicitis, interval increase in size of right acetabular lytic lesion likely representing subchondral cyst/degenerative changes.   Urology was consulted.  TRH consulted for admission for further evaluation and management of acute renal failure, worsening left hydronephrosis.  Assessment & Plan:   Acute renal failure likely secondary to bilateral ureteral obstruction Bilateral hydronephrosis, left greater than right Creatinine on admission 1.84.  CT Abdo/pelvis with interval worsening severe left hydronephrosis with distal migration of the ureteral stent.  Urology was consulted and patient underwent cystoscopy with bilateral stent exchange by Dr. Margo Aye on 02/16/2023. -- Urology following, appreciate assistance -- LR at 112mL/h -- BMP in am  Suspected urinary tract infection Denies fevers or dysuria.  But given abnormal urinalysis, stent in place with plan for stent exchange reasonable to continue antibiotics. -- Urine culture: Pending -- Ceftriaxone 1 g IV every 24 hours  Chronic pain syndrome -- Lyrica 225 mg p.o. twice daily -- Cymbalt 150mg  PO daily -- Dilaudid 0.5 - 1 mg IV q2h PRN severe breakthrough pain  Hypothyroidism -- Levothyroxine 50 mcg p.o. daily  Hyperlipidemia -- Crestor 5mg  PO qHS  Seizure disorder -- Keppra 500mg  PO BID  GERD -- Pepcid 20mg  PO BID  Obesity Body mass index is 40.87 kg/m.  Discussed with patient needs for aggressive lifestyle changes/weight loss as this complicates all facets of care.  Outpatient follow-up with PCP.      DVT prophylaxis: heparin injection 5,000 Units Start: 02/15/23 0900 SCDs Start: 02/15/23 0846    Code Status: Full Code Family Communication: No family present at bedside this morning  Disposition Plan:  Level of care: Med-Surg Status is: Inpatient Remains inpatient appropriate because: IV antibiotics, IV fluids    Consultants:  Urology  Procedures:  Cystoscopy with bilateral ureteral stent exchange, Dr. Margo Aye 8/4  Antimicrobials:  Ceftriaxone 8/3  Subjective: Patient seen examined bedside, resting comfortably.  Lying in bed.  Continues  to be concern regarding lack of bowel movement from ostomy.  Pending cystoscopy with stent exchange today by urology.  No other specific complaints or concerns at this time.  Denies headache, no dizziness, no chest pain, no palpitations, no fever/chills/night sweats, no nausea/vomiting/diarrhea, no cough/congestion, no focal weakness, no fatigue, no paresthesias.  No acute events overnight per nurse staff.  Objective: Vitals:   02/16/23 1014 02/16/23 1027 02/16/23 1029 02/16/23 1038  BP:    (!) 129/93  Pulse: 93 98 98 94  Resp: 13 11 11 11   Temp:      TempSrc:      SpO2: 94% 97% 98% 94%  Weight:      Height:        Intake/Output Summary (Last 24 hours) at 02/16/2023 1228 Last data filed at 02/16/2023 1125 Gross per 24 hour  Intake 808.43 ml  Output 1 ml  Net 807.43 ml   Filed Weights   02/14/23 1932  Weight: 132.9 kg    Examination:  Physical Exam: GEN: NAD, alert and oriented x 3, obese HEENT: NCAT, PERRL, EOMI, sclera clear, MMM PULM: CTAB w/o wheezes/crackles, normal respiratory effort, on room air CV: RRR w/o M/G/R GI: abd soft, NTND, NABS, no R/G/M, ostomy noted with air in pouch, no stool MSK: no peripheral edema, muscle strength globally intact 5/5 bilateral upper/lower extremities NEURO: CN II-XII intact, no focal deficits, sensation to light touch intact PSYCH: normal mood/affect Integumentary: dry/intact, no rashes or wounds    Data Reviewed: I have personally reviewed following labs and imaging studies  CBC: Recent Labs  Lab 02/14/23 1946  WBC 9.8  HGB 12.0  HCT 38.8  MCV 84.7  PLT 341   Basic Metabolic Panel: Recent Labs  Lab 02/14/23 1946  NA 136  K 3.7  CL 101  CO2 25  GLUCOSE 133*  BUN 21*  CREATININE 1.84*  CALCIUM 9.4   GFR: Estimated Creatinine Clearance: 51.5 mL/min (A) (by C-G formula based on SCr of 1.84 mg/dL (H)). Liver Function Tests: Recent Labs  Lab 02/14/23 1946  AST 13*  ALT 13  ALKPHOS 73  BILITOT 0.3  PROT 8.2*   ALBUMIN 3.7   Recent Labs  Lab 02/14/23 1946  LIPASE 28   No results for input(s): "AMMONIA" in the last 168 hours. Coagulation Profile: No results for input(s): "INR", "PROTIME" in the last 168 hours. Cardiac Enzymes: No results for input(s): "CKTOTAL", "CKMB", "CKMBINDEX", "TROPONINI" in the last 168 hours. BNP (last 3 results) No results for input(s): "PROBNP" in the last 8760 hours. HbA1C: No results for input(s): "HGBA1C" in the last 72 hours. CBG: No results for input(s): "GLUCAP" in the last 168 hours. Lipid Profile: No results for input(s): "CHOL", "HDL", "LDLCALC", "TRIG", "CHOLHDL", "LDLDIRECT" in the last 72 hours. Thyroid Function Tests: No results for input(s): "TSH", "T4TOTAL", "FREET4", "T3FREE", "THYROIDAB" in the last 72 hours. Anemia Panel: No results for input(s): "VITAMINB12", "FOLATE", "FERRITIN", "TIBC", "IRON", "RETICCTPCT" in the last 72 hours. Sepsis Labs: No results for input(s): "PROCALCITON", "LATICACIDVEN" in the last 168 hours.  No results found for this or any previous visit (from the past 240 hour(s)).       Radiology Studies: DG C-Arm 1-60 Min-No Report  Result Date: 02/16/2023 Fluoroscopy was utilized by the requesting physician.  No radiographic interpretation.   CT ABDOMEN PELVIS WO CONTRAST  Result Date: 02/15/2023 CLINICAL DATA:  Bowel obstruction suspected t complaining of abdominal  pain in the abdomen. Does have an colostomy which she said has had no output for the last 6 days. Sharp pain in the upper left abdomen and pain in the lower rt abdomen that radiates to the back and down the leg. EXAM: CT ABDOMEN AND PELVIS WITHOUT CONTRAST TECHNIQUE: Multidetector CT imaging of the abdomen and pelvis was performed following the standard protocol without IV contrast. RADIATION DOSE REDUCTION: This exam was performed according to the departmental dose-optimization program which includes automated exposure control, adjustment of the mA and/or kV  according to patient size and/or use of iterative reconstruction technique. COMPARISON:  CT abdomen pelvis 12/19/2015, CT abdomen pelvis 06/16/2017, CT abdomen pelvis 06/18/2019, CT abdomen pelvis 10/04/2022 FINDINGS: Lower chest: No acute abnormality. Hepatobiliary: No focal liver abnormality. No gallstones, gallbladder wall thickening, or pericholecystic fluid. No biliary dilatation. Pancreas: No focal lesion. Normal pancreatic contour. No surrounding inflammatory changes. No main pancreatic ductal dilatation. Spleen: Normal in size without focal abnormality. Adrenals/Urinary Tract: No adrenal nodule bilaterally. Bilateral ureteral stents. Right ureteral stent proximal pigtail within the right renal pelvis and distal pigtail within the urinary bladder lumen. Left ureteral stent with proximal pigtail at the left uteropelvic junction and distal pigtail within the urinary bladder lumen. Associated worsening of severe left hydronephrosis and associated persistent moderate right hydronephrosis. No nephroureterolithiasis. Hydroureter bilaterally. No nephroureterolithiasis. The urinary bladder is unremarkable. Stomach/Bowel: Surgical changes related to a lower anterior resection and left abdominal end colostomy formation. Stomach is within normal limits. No evidence of bowel wall thickening or dilatation. Rectal stump communicates with previously identified presacral soft tissues with associated gas. Adhesive adhesive versus truncated versus communicating appendix with the presacral soft tissues in surgical site of the anterior lower resection (9:16, 7:112). The appendix appears to be thickened caliber measuring up to 14 mm. The appendiceal tip is not visualized. Fatty replacement of the appendiceal wall suggest chronic inflammatory changes. No acute inflammatory changes identified along the cecum or the base of the appendix. Vascular/Lymphatic: No abdominal aorta or iliac aneurysm. Mild atherosclerotic plaque of the  aorta and its branches. No abdominal, pelvic, or inguinal lymphadenopathy. Reproductive: Hysterectomy.  No mass. Other: No intraperitoneal free fluid. No intraperitoneal free gas. No organized fluid collection. Musculoskeletal: No abdominal wall hernia or abnormality. No suspicious lytic or blastic osseous lesions. Interval increase in size of a right acetabular lytic lesion likely representing subchondral cysts/degenerative changes. No acute displaced fracture. L4-L5 posterolateral interbody surgical hardware fusion. Intervertebral disc space narrowing and endplate sclerosis at the L3-L4 level. Intervertebral disc space vacuum phenomenon at the L2-L3 level. IMPRESSION: 1. Interval worsening of severe left hydronephrosis in the setting of slight distal migration of the uterus stent with proximal pigtail at the left uteropelvic junction. 2. Persistent moderate right hydronephrosis in the setting of a right ureteral stent in appropriate position. 3. Anterior lower resection surgical changes. Rectal stump communicates with previously identified presacral soft tissues density with associated gas. Possible associated communication with the appendix versus a truncated appendix or adhesive appendix. No definite findings of acute appendicitis. 4.  Aortic Atherosclerosis (ICD10-I70.0). 5. Limited study on this noncontrast study. 6. Interval increase in size of a right acetabular lytic lesion likely representing subchondral cysts/degenerative changes. Recommend attention on follow-up. Electronically Signed   By: Tish Frederickson M.D.   On: 02/15/2023 01:35        Scheduled Meds:  docusate sodium  100 mg Oral BID   DULoxetine  120 mg Oral Daily   famotidine  20 mg Oral BID  heparin  5,000 Units Subcutaneous Q8H   levETIRAcetam  500 mg Oral BID   levothyroxine  50 mcg Oral q morning   oxybutynin  10 mg Oral QHS   And   oxybutynin  5 mg Oral QHS   polyethylene glycol  17 g Oral Daily   pregabalin  225 mg Oral  BID   rosuvastatin  5 mg Oral QHS   scopolamine       Continuous Infusions:  cefTRIAXone (ROCEPHIN)  IV Stopped (02/15/23 9811)   lactated ringers 125 mL/hr at 02/16/23 1125   sodium chloride 0.9 % with cefTRIAXone (ROCEPHIN) ADS Med       LOS: 1 day    Time spent: 52 minutes spent on chart review, discussion with nursing staff, consultants, updating family and interview/physical exam; more than 50% of that time was spent in counseling and/or coordination of care.    Alvira Philips [place], DO [facility] Available via Epic secure chat 7am-7pm After these hours, please refer to coverage provider listed on [email or web address] 02/16/2023, 12:28 PM

## 2023-02-16 NOTE — Consult Note (Signed)
WOC Nurse ostomy consult note Consult requested for provision of ostomy supplies. Stoma type/location: LUQ colostomy. Present since 2017 (Dr. Michaell Cowing). Stomal assessment/size: Not measured today Peristomal assessment: Not seen today Treatment options for stomal/peristomal skin: suggest use of skin barrier ring is patient elects to use Output: soft brown stool (per notes) Ostomy pouching: 2pc. 2 and 1/4 inch pouching supplies provided from our formulary. Pouch is [employer] [place] # [id number], Skin barrier  is [employer] # [id number] and skin barrier ring is [employer] [place] # [id number]. Education provided: None required Enrolled patient in [employer] DC program: No. Patient is established with a supplier.   WOC nursing team will not follow, but will remain available to this patient, the nursing and medical teams.  Please re-consult if needed.  Thank you for inviting [place] to participate in this patient's Plan of Care.  Ladona Mow, MSN, RN, CNS, GNP, Leda Min, [employer], [employer] phone:  [phone]

## 2023-02-16 NOTE — Interval H&P Note (Signed)
History and Physical Interval Note:  02/16/2023 8:54 AM  [name]  has presented today for surgery, with the diagnosis of BILATERAL URETERAL STRICTURE.  The various methods of treatment have been discussed with the patient and family. After consideration of risks, benefits and other options for treatment, the patient has consented to  Procedure(s): CYSTOSCOPY WITH RETROGRADE PYELOGRAM BILATERAL STENT EXCHANGE (Bilateral) as a surgical intervention.  The patient's history has been reviewed, patient examined, no change in status, stable for surgery.  I have reviewed the patient's chart and labs.  Questions were answered to the patient's satisfaction.     Lindsay Little

## 2023-02-17 ENCOUNTER — Encounter (HOSPITAL_COMMUNITY): Payer: Self-pay | Admitting: Urology

## 2023-02-17 DIAGNOSIS — N179 Acute kidney failure, unspecified: Secondary | ICD-10-CM | POA: Diagnosis not present

## 2023-02-17 NOTE — Progress Notes (Signed)
PROGRESS NOTE    Lindsay Little  YQM:[id number] DOB: Jul 28, 1965 DOA: 02/14/2023 PCP: Laurann Montana, MD    Brief Narrative:   Lindsay Little is a 57 y.o. female with past medical history significant for HTN, HLD, rectal cancer s/p resection/colostomy, chronic abdominal pain, history of multiple intra-abdominal abscesses and recurrent SBO who presented to [facility] ED on 8/2 with complaints of abdominal pain.  Pain localized to the left upper abdomen and right lower abdomen that is sharp with radiation to the back and down her leg.  Patient reports that she has been eating normally but had developed some nausea.  When symptoms progressed she sought further care in the ED.  Reports no bowel movement 7 days prior to arrival.  Over the last 5 days she has been taking MiraLAX twice daily.  Denies vomiting, no fever/chills, no chest pain, no urinary symptoms.  In the ED, temperature 100.0 F, HR 119, RR 18, BP 142/94, SpO2 96% on room air.  WBC 9.8, hemoglobin 12.0, platelets 341.  Sodium 136, potassium 3.7, chloride 101, CO2 25, glucose 133, BUN 21, creatinine 1.84.  Lipase 28, AST 13, ALT 13, total bilirubin 0.3.  Urinalysis with moderate leukocytes, negative nitrite, rare bacteria, 21-50 WBCs.  Urine culture obtained.  CT abdomen/pelvis with interval worsening severe left hydronephrosis with slight distal migration of the ureteral stent with proximal pigtail at the left ureteropelvic junction, persistent moderate right hydronephrosis with right ureteral stent in appropriate position, anterior lower resection surgical changes, rectal stump communicates with previously identified presacral soft tissue density with associated gas, possible associated communication with the appendix versus truncated appendix or adhesive appendix, no definitive findings of acute appendicitis, interval increase in size of right acetabular lytic lesion likely representing subchondral cyst/degenerative changes.   Urology was consulted.  TRH consulted for admission for further evaluation and management of acute renal failure, worsening left hydronephrosis.  Assessment & Plan:   Acute renal failure likely secondary to bilateral ureteral obstruction Bilateral hydronephrosis, left greater than right Creatinine on admission 1.84.  CT Abdo/pelvis with interval worsening severe left hydronephrosis with distal migration of the ureteral stent.  Urology was consulted and patient underwent cystoscopy with bilateral stent exchange by Dr. Margo Aye on 02/16/2023. -- Cr 1.84>1.33>1.15 -- DC IV fluids today -- BMP in am  Suspected urinary tract infection: Ruled out Denies fevers or dysuria.  But given abnormal urinalysis, stent in place with plan for stent exchange reasonable to continue antibiotics.  Urine culture with multiple species present.  Completed 3-day course of IV ceftriaxone, will discontinue.  Constipation Patient reports poor output from her ostomy over the last 10 days.  High concerns given previous history of bowel/partial small bowel obstruction although nothing significant found on her admission CT abdomen/pelvis. -- MiraLAX 3 times daily -- Senokot-as 2 tablets p.o. twice daily -- Strict I's and O's  Chronic pain syndrome -- Lyrica 225 mg p.o. twice daily -- Cymbalta 150mg  PO daily -- Dilaudid 0.5 - 1 mg IV q2h PRN severe breakthrough pain  Hypothyroidism -- Levothyroxine 50 mcg p.o. daily  Hyperlipidemia -- Crestor 5mg  PO qHS  Seizure disorder -- Keppra 500mg  PO BID  GERD -- Pepcid 20mg  PO BID  Obesity Body mass index is 40.87 kg/m.  Discussed with patient needs for aggressive lifestyle changes/weight loss as this complicates all facets of care.  Outpatient follow-up with PCP.      DVT prophylaxis: heparin injection 5,000 Units Start: 02/15/23 0900 SCDs Start: 02/15/23 5284  Code Status: Full Code Family Communication: No family present at bedside this morning  Disposition  Plan:  Level of care: Med-Surg Status is: Inpatient Remains inpatient appropriate because: IV antibiotics, IV fluids    Consultants:  Urology  Procedures:  Cystoscopy with bilateral ureteral stent exchange, Dr. Margo Aye 8/4  Antimicrobials:  Ceftriaxone 8/3 - 8/5   Subjective: Patient seen examined bedside, resting comfortably.  Lying in bed.  Continues to be concerned regarding lack of bowel movement from ostomy.  Pending cystoscopy with stent exchange today by urology.  No other specific complaints or concerns at this time.  Denies headache, no dizziness, no chest pain, no palpitations, no fever/chills/night sweats, no nausea/vomiting, no cough/congestion, no focal weakness, no fatigue, no paresthesias.  No acute events overnight per nursing staff.  Objective: Vitals:   02/16/23 1303 02/16/23 2152 02/17/23 0520 02/17/23 0940  BP: (!) 171/99 129/85 127/74 (!) 123/53  Pulse:  71 (!) 56 (!) 53  Resp: 18 14 16    Temp: 98.4 F (36.9 C) 98.7 F (37.1 C) 97.6 F (36.4 C)   TempSrc: Oral Oral Oral   SpO2: 97% 96% 95%   Weight:      Height:        Intake/Output Summary (Last 24 hours) at 02/17/2023 1234 Last data filed at 02/17/2023 0600 Gross per 24 hour  Intake 3193.71 ml  Output --  Net 3193.71 ml   Filed Weights   02/14/23 1932  Weight: 132.9 kg    Examination:  Physical Exam: GEN: NAD, alert and oriented x 3, obese HEENT: NCAT, PERRL, EOMI, sclera clear, MMM PULM: CTAB w/o wheezes/crackles, normal respiratory effort, on room air CV: RRR w/o M/G/R GI: abd soft, NTND, NABS, no R/G/M, ostomy noted with small amount of liquid stool in pouch MSK: no peripheral edema, muscle strength globally intact 5/5 bilateral upper/lower extremities NEURO: CN II-XII intact, no focal deficits, sensation to light touch intact PSYCH: normal mood/affect Integumentary: dry/intact, no rashes or wounds    Data Reviewed: I have personally reviewed following labs and imaging  studies  CBC: Recent Labs  Lab 02/14/23 1946 02/16/23 1359 02/17/23 0541  WBC 9.8 8.0 8.1  HGB 12.0 11.2* 10.7*  HCT 38.8 37.6 34.5*  MCV 84.7 87.0 83.3  PLT 341 276 PLATELET CLUMPS NOTED ON SMEAR, UNABLE TO ESTIMATE   Basic Metabolic Panel: Recent Labs  Lab 02/14/23 1946 02/16/23 1359 02/17/23 0541  NA 136 136 135  K 3.7 4.6 4.5  CL 101 100 101  CO2 25 26 25   GLUCOSE 133* 166* 133*  BUN 21* 16 18  CREATININE 1.84* 1.33* 1.15*  CALCIUM 9.4 9.4 9.1   GFR: Estimated Creatinine Clearance: 82.4 mL/min (A) (by C-G formula based on SCr of 1.15 mg/dL (H)). Liver Function Tests: Recent Labs  Lab 02/14/23 1946  AST 13*  ALT 13  ALKPHOS 73  BILITOT 0.3  PROT 8.2*  ALBUMIN 3.7   Recent Labs  Lab 02/14/23 1946  LIPASE 28   No results for input(s): "AMMONIA" in the last 168 hours. Coagulation Profile: No results for input(s): "INR", "PROTIME" in the last 168 hours. Cardiac Enzymes: No results for input(s): "CKTOTAL", "CKMB", "CKMBINDEX", "TROPONINI" in the last 168 hours. BNP (last 3 results) No results for input(s): "PROBNP" in the last 8760 hours. HbA1C: No results for input(s): "HGBA1C" in the last 72 hours. CBG: No results for input(s): "GLUCAP" in the last 168 hours. Lipid Profile: No results for input(s): "CHOL", "HDL", "LDLCALC", "TRIG", "CHOLHDL", "LDLDIRECT" in the last  72 hours. Thyroid Function Tests: No results for input(s): "TSH", "T4TOTAL", "FREET4", "T3FREE", "THYROIDAB" in the last 72 hours. Anemia Panel: No results for input(s): "VITAMINB12", "FOLATE", "FERRITIN", "TIBC", "IRON", "RETICCTPCT" in the last 72 hours. Sepsis Labs: No results for input(s): "PROCALCITON", "LATICACIDVEN" in the last 168 hours.  Recent Results (from the past 240 hour(s))  Urine Culture (for pregnant, neutropenic or urologic patients or patients with an indwelling urinary catheter)     Status: Abnormal   Collection Time: 02/14/23  7:45 PM   Specimen: Urine, Clean Catch   Result Value Ref Range Status   Specimen Description   Final    URINE, CLEAN CATCH Performed at [facility], [address], [place], [place] [address]    Special Requests   Final    NONE Performed at [facility], [address] [place], [place] [address]    Culture MULTIPLE SPECIES PRESENT, SUGGEST RECOLLECTION (A)  Final   Report Status 02/16/2023 FINAL  Final         Radiology Studies: DG C-Arm 1-60 Min-No Report  Result Date: 02/16/2023 Fluoroscopy was utilized by the requesting physician.  No radiographic interpretation.        Scheduled Meds:  bisoprolol  5 mg Oral Daily   DULoxetine  120 mg Oral Daily   famotidine  20 mg Oral BID   heparin  5,000 Units Subcutaneous Q8H   levETIRAcetam  500 mg Oral BID   levothyroxine  50 mcg Oral q morning   oxybutynin  10 mg Oral QHS   And   oxybutynin  5 mg Oral QHS   polyethylene glycol  17 g Oral TID   pregabalin  225 mg Oral BID   rosuvastatin  5 mg Oral QHS   senna-docusate  2 tablet Oral BID   tamsulosin  0.4 mg Oral Daily   Continuous Infusions:  cefTRIAXone (ROCEPHIN)  IV 1 g (02/17/23 0938)   lactated ringers 125 mL/hr at 02/17/23 0600     LOS: 2 days    Time spent: 52 minutes spent on chart review, discussion with nursing staff, consultants, updating family and interview/physical exam; more than 50% of that time was spent in counseling and/or coordination of care.    Alvira Philips [place], DO [facility] Available via Epic secure chat 7am-7pm After these hours, please refer to coverage provider listed on [email or web address] 02/17/2023, 12:34 PM

## 2023-02-17 NOTE — Progress Notes (Signed)
   1 Day Post-Op Subjective: Pt on side of the bed just having finished breakfast. No urologic complaints. NAEON. Feels ready to go home.   Objective: Vital signs in last 24 hours: Temp:  [97.6 F (36.4 C)-98.7 F (37.1 C)] 97.6 F (36.4 C) (08/05 0520) Pulse Rate:  [56-102] 56 (08/05 0520) Resp:  [11-18] 16 (08/05 0520) BP: (127-182)/(74-109) 127/74 (08/05 0520) SpO2:  [94 %-100 %] 95 % (08/05 0520)  Intake/Output from previous day: 08/04 0701 - 08/05 0700 In: 4087 [P.O.:990; I.V.:2747; IV Piggyback:350] Out: 1 [Blood:1]  Intake/Output this shift: No intake/output data recorded.  Physical Exam:  General: Alert and oriented CV: No cyanosis Lungs: equal chest rise   Lab Results: Recent Labs    02/14/23 1946 02/16/23 1359 02/17/23 0541  HGB 12.0 11.2* 10.7*  HCT 38.8 37.6 34.5*   BMET Recent Labs    02/16/23 1359 02/17/23 0541  NA 136 135  K 4.6 4.5  CL 100 101  CO2 26 25  GLUCOSE 166* 133*  BUN 16 18  CREATININE 1.33* 1.15*  CALCIUM 9.4 9.1     Studies/Results: DG C-Arm 1-60 Min-No Report  Result Date: 02/16/2023 Fluoroscopy was utilized by the requesting physician.  No radiographic interpretation.    Assessment/Plan: #Chronic Bilateral Ureteral Obstruction 2/2 metastatic rectal cancer S/p bilateral ureteral stent placement with Dr. Margo Aye 8/4 Followed by Dr. Logan Bores with [facility]. She will continue to get her Urologic care with him. 4L UOP, renal function is nearly at baseline. Pt OK to discharge home from a urologic perspective. We will sign off at this time. Please call with questions.    LOS: 2 days   Elmon Kirschner, NP [facility] Pager: [phone]  02/17/2023, 8:39 AM

## 2023-02-18 DIAGNOSIS — N179 Acute kidney failure, unspecified: Secondary | ICD-10-CM | POA: Diagnosis not present

## 2023-02-18 NOTE — Care Management Important Message (Signed)
Important Message  Patient Details IM Letter given. Name: Lindsay Little MRN: [id number] Date of Birth: 1965/10/17   Medicare Important Message Given:  Yes     Caren Macadam 02/18/2023, 9:54 AM

## 2023-02-18 NOTE — Care Plan (Signed)
AVS given. Pt A&O, VSS, IV removed. Ready for discharge.

## 2023-02-18 NOTE — Discharge Summary (Signed)
Physician Discharge Summary  Lindsay Little:[id number] DOB: 1965/07/22 DOA: 02/14/2023  PCP: Laurann Montana, MD  Admit date: 02/14/2023 Discharge date: 02/18/2023  Admitted From: Home Disposition: Home  Recommendations for Outpatient Follow-up:  Follow up with PCP in 1-2 weeks Follow-up with urology, Dr. Logan Bores as scheduled  Home Health: No Equipment/Devices: None  Discharge Condition: Stable CODE STATUS: Full code Diet recommendation: Heart healthy diet  History of present illness:  Lindsay Little is a 57 y.o. female with past medical history significant for HTN, HLD, rectal cancer s/p resection/colostomy, chronic abdominal pain, history of multiple intra-abdominal abscesses and recurrent SBO who presented to [facility] ED on 8/2 with complaints of abdominal pain.  Pain localized to the left upper abdomen and right lower abdomen that is sharp with radiation to the back and down her leg.  Patient reports that she has been eating normally but had developed some nausea.  When symptoms progressed she sought further care in the ED.  Reports no bowel movement 7 days prior to arrival.  Over the last 5 days she has been taking MiraLAX twice daily.  Denies vomiting, no fever/chills, no chest pain, no urinary symptoms.   In the ED, temperature 100.0 F, HR 119, RR 18, BP 142/94, SpO2 96% on room air.  WBC 9.8, hemoglobin 12.0, platelets 341.  Sodium 136, potassium 3.7, chloride 101, CO2 25, glucose 133, BUN 21, creatinine 1.84.  Lipase 28, AST 13, ALT 13, total bilirubin 0.3.  Urinalysis with moderate leukocytes, negative nitrite, rare bacteria, 21-50 WBCs.  Urine culture obtained.  CT abdomen/pelvis with interval worsening severe left hydronephrosis with slight distal migration of the ureteral stent with proximal pigtail at the left ureteropelvic junction, persistent moderate right hydronephrosis with right ureteral stent in appropriate position, anterior lower resection surgical  changes, rectal stump communicates with previously identified presacral soft tissue density with associated gas, possible associated communication with the appendix versus truncated appendix or adhesive appendix, no definitive findings of acute appendicitis, interval increase in size of right acetabular lytic lesion likely representing subchondral cyst/degenerative changes.  Urology was consulted.  TRH consulted for admission for further evaluation and management of acute renal failure, worsening left hydronephrosis.  Hospital course:  Acute renal failure likely secondary to bilateral ureteral obstruction Bilateral hydronephrosis, left greater than right Creatinine on admission 1.84.  CT Abdomen/pelvis with interval worsening severe left hydronephrosis with distal migration of the ureteral stent.  Urology was consulted and patient underwent cystoscopy with bilateral stent exchange by Dr. Margo Aye on 02/16/2023.  Creatinine improved to 0.94 at time of discharge.  Outpatient follow-up with urology, Dr. Logan Bores as scheduled.   Suspected urinary tract infection: Ruled out Denies fevers or dysuria.  But given abnormal urinalysis, stent in place with plan for stent exchange reasonable to continue antibiotics.  Urine culture with multiple species present.  Completed 3-day course of IV ceftriaxone, will discontinue.   Constipation Patient reports poor output from her ostomy over the last 10 days.  High concerns given previous history of bowel/partial small bowel obstruction although nothing significant found on her admission CT abdomen/pelvis.  Resolved with aggressive bowel regimen with MiraLAX and senna.   Chronic pain syndrome Lyrica 225 mg p.o. twice daily, Cymbalta 150mg  PO daily   Hypothyroidism Levothyroxine 50 mcg p.o. daily   Hyperlipidemia Crestor 5mg  PO qHS   Seizure disorder Keppra 500mg  PO BID   GERD Pepcid 20mg  PO BID   Obesity Body mass index is 40.87 kg/m.  Discussed with patient needs  for aggressive lifestyle changes/weight loss as this complicates all facets of care.  Outpatient follow-up with PCP.  Discharge Diagnoses:  Principal Problem:   AKI (acute kidney injury) [facility])    Discharge Instructions  Discharge Instructions     Call MD for:  difficulty breathing, headache or visual disturbances   Complete by: As directed    Call MD for:  extreme fatigue   Complete by: As directed    Call MD for:  persistant dizziness or light-headedness   Complete by: As directed    Call MD for:  persistant nausea and vomiting   Complete by: As directed    Call MD for:  severe uncontrolled pain   Complete by: As directed    Call MD for:  temperature >100.4   Complete by: As directed    Diet - low sodium heart healthy   Complete by: As directed    Increase activity slowly   Complete by: As directed       Allergies as of 02/18/2023       Reactions   Caine-1 [lidocaine] Swelling, Rash   Eyes swell shut; includes all caine drugs except marcaine. EMLA cream OK though    Bupropion Other (See Comments)   Not effective per Pt.    Sulfa Antibiotics Nausea And Vomiting, Rash   Pantoprazole Other (See Comments)   Not effective per Pt.    Adhesive [tape] Rash, Other (See Comments)   Blisters - can use paper tape   Doxycycline Nausea Only   Flagyl [metronidazole] Nausea Only   Iron Nausea Only   Metoprolol Nausea Only, Palpitations   Oxycodone Other (See Comments)   NIGHTMARES. (tolerates hydrocodone or tramadol better)   Penicillins Nausea Only, Rash        Medication List     STOP taking these medications    celecoxib 100 MG capsule Commonly known as: CELEBREX   morphine 15 MG 12 hr tablet Commonly known as: MS CONTIN   ondansetron 4 MG tablet Commonly known as: ZOFRAN   Qutenza (4 Patch) 8 % Generic drug: capsaicin topical system       TAKE these medications    acetaminophen 500 MG tablet Commonly known as: TYLENOL Take 1,000 mg by mouth as needed  for moderate pain.   Belbuca 750 MCG Film Generic drug: Buprenorphine HCl Place 750 mcg inside cheek in the morning and at bedtime.   bisoprolol 5 MG tablet Commonly known as: ZEBETA Take 5 mg by mouth daily.   CoQ10 200 MG Caps Take 200 mg by mouth at bedtime.   DULoxetine 60 MG capsule Commonly known as: CYMBALTA Take 120 mg by mouth every evening.   esomeprazole 40 MG capsule Commonly known as: NEXIUM Take 1 capsule (40 mg total) by mouth 2 (two) times daily before a meal. What changed:  when to take this additional instructions   famotidine 20 MG tablet Commonly known as: PEPCID Take 20 mg by mouth 2 (two) times daily as needed for indigestion or heartburn.   furosemide 20 MG tablet Commonly known as: LASIX Take 20 mg by mouth daily as needed for edema.   HYDROcodone-acetaminophen 5-325 MG tablet Commonly known as: NORCO/VICODIN Take 1 tablet by mouth 2 (two) times daily as needed (break through pain).   hyoscyamine 0.125 MG tablet Commonly known as: LEVSIN Take 0.125 mg by mouth 3 (three) times daily as needed for cramping.   levETIRAcetam 250 MG tablet Commonly known as: KEPPRA Take 500 mg by mouth 2 (two)  times daily.   levothyroxine 50 MCG tablet Commonly known as: SYNTHROID Take 50 mcg by mouth every morning.   melatonin 3 MG Tabs tablet Take 3-6 mg by mouth at bedtime as needed (sleep).   multivitamin with minerals Tabs tablet Take 2 tablets by mouth at bedtime.   naloxone 4 MG/0.1ML Liqd nasal spray kit Commonly known as: NARCAN Place 0.4 mg into the nose once.   oxybutynin 15 MG 24 hr tablet Commonly known as: DITROPAN XL Take 15 mg by mouth daily.   polyethylene glycol 17 g packet Commonly known as: MIRALAX / GLYCOLAX Take 17 g by mouth 2 (two) times daily.   pregabalin 225 MG capsule Commonly known as: LYRICA Take 225 mg by mouth 2 (two) times daily.   promethazine 25 MG tablet Commonly known as: PHENERGAN TAKE ONE TABLET BY MOUTH  EVERY 6 HOURS AS NEEDED FOR NAUSEA OR VOMITING What changed: See the new instructions.   rosuvastatin 5 MG tablet Commonly known as: CRESTOR Take 5 mg by mouth at bedtime.   tamsulosin 0.4 MG Caps capsule Commonly known as: FLOMAX Take 0.4 mg by mouth daily.   triamcinolone lotion 0.1 % Commonly known as: KENALOG Apply 1 application topically as needed (dry skin).   Vitamin D3 125 MCG (5000 UT) Caps Take 5,000 Units by mouth at bedtime.        Follow-up Information     Laurann Montana, MD. Schedule an appointment as soon as possible for a visit in 1 week(s).   Specialty: Family Medicine Contact information: [address] [place] [address] [phone]         Jamison Neighbor, MD. Go to.   Specialty: Urology Contact information: [address] [place] [address] [phone]                Allergies  Allergen Reactions   Caine-1 [Lidocaine] Swelling and Rash    Eyes swell shut; includes all caine drugs except marcaine. EMLA cream OK though    Bupropion Other (See Comments)    Not effective per Pt.    Sulfa Antibiotics Nausea And Vomiting and Rash   Pantoprazole Other (See Comments)    Not effective per Pt.    Adhesive [Tape] Rash and Other (See Comments)    Blisters - can use paper tape   Doxycycline Nausea Only   Flagyl [Metronidazole] Nausea Only   Iron Nausea Only   Metoprolol Nausea Only and Palpitations   Oxycodone Other (See Comments)    NIGHTMARES. (tolerates hydrocodone or tramadol better)   Penicillins Nausea Only and Rash    Consultations: Urology, Dr. Margo Aye   Procedures/Studies: DG C-Arm 1-60 Min-No Report  Result Date: 02/16/2023 Fluoroscopy was utilized by the requesting physician.  No radiographic interpretation.   CT ABDOMEN PELVIS WO CONTRAST  Result Date: 02/15/2023 CLINICAL DATA:  Bowel obstruction suspected t complaining of abdominal pain in the abdomen. Does have an colostomy which she said has  had no output for the last 6 days. Sharp pain in the upper left abdomen and pain in the lower rt abdomen that radiates to the back and down the leg. EXAM: CT ABDOMEN AND PELVIS WITHOUT CONTRAST TECHNIQUE: Multidetector CT imaging of the abdomen and pelvis was performed following the standard protocol without IV contrast. RADIATION DOSE REDUCTION: This exam was performed according to the departmental dose-optimization program which includes automated exposure control, adjustment of the mA and/or kV according to patient size and/or use of iterative reconstruction technique.  COMPARISON:  CT abdomen pelvis 12/19/2015, CT abdomen pelvis 06/16/2017, CT abdomen pelvis 06/18/2019, CT abdomen pelvis 10/04/2022 FINDINGS: Lower chest: No acute abnormality. Hepatobiliary: No focal liver abnormality. No gallstones, gallbladder wall thickening, or pericholecystic fluid. No biliary dilatation. Pancreas: No focal lesion. Normal pancreatic contour. No surrounding inflammatory changes. No main pancreatic ductal dilatation. Spleen: Normal in size without focal abnormality. Adrenals/Urinary Tract: No adrenal nodule bilaterally. Bilateral ureteral stents. Right ureteral stent proximal pigtail within the right renal pelvis and distal pigtail within the urinary bladder lumen. Left ureteral stent with proximal pigtail at the left uteropelvic junction and distal pigtail within the urinary bladder lumen. Associated worsening of severe left hydronephrosis and associated persistent moderate right hydronephrosis. No nephroureterolithiasis. Hydroureter bilaterally. No nephroureterolithiasis. The urinary bladder is unremarkable. Stomach/Bowel: Surgical changes related to a lower anterior resection and left abdominal end colostomy formation. Stomach is within normal limits. No evidence of bowel wall thickening or dilatation. Rectal stump communicates with previously identified presacral soft tissues with associated gas. Adhesive adhesive versus  truncated versus communicating appendix with the presacral soft tissues in surgical site of the anterior lower resection (9:16, 7:112). The appendix appears to be thickened caliber measuring up to 14 mm. The appendiceal tip is not visualized. Fatty replacement of the appendiceal wall suggest chronic inflammatory changes. No acute inflammatory changes identified along the cecum or the base of the appendix. Vascular/Lymphatic: No abdominal aorta or iliac aneurysm. Mild atherosclerotic plaque of the aorta and its branches. No abdominal, pelvic, or inguinal lymphadenopathy. Reproductive: Hysterectomy.  No mass. Other: No intraperitoneal free fluid. No intraperitoneal free gas. No organized fluid collection. Musculoskeletal: No abdominal wall hernia or abnormality. No suspicious lytic or blastic osseous lesions. Interval increase in size of a right acetabular lytic lesion likely representing subchondral cysts/degenerative changes. No acute displaced fracture. L4-L5 posterolateral interbody surgical hardware fusion. Intervertebral disc space narrowing and endplate sclerosis at the L3-L4 level. Intervertebral disc space vacuum phenomenon at the L2-L3 level. IMPRESSION: 1. Interval worsening of severe left hydronephrosis in the setting of slight distal migration of the uterus stent with proximal pigtail at the left uteropelvic junction. 2. Persistent moderate right hydronephrosis in the setting of a right ureteral stent in appropriate position. 3. Anterior lower resection surgical changes. Rectal stump communicates with previously identified presacral soft tissues density with associated gas. Possible associated communication with the appendix versus a truncated appendix or adhesive appendix. No definite findings of acute appendicitis. 4.  Aortic Atherosclerosis (ICD10-I70.0). 5. Limited study on this noncontrast study. 6. Interval increase in size of a right acetabular lytic lesion likely representing subchondral  cysts/degenerative changes. Recommend attention on follow-up. Electronically Signed   By: Tish Frederickson M.D.   On: 02/15/2023 01:35     Subjective: Patient seen examined bedside, resting calmly.  Lying in bed.  Reports increased ostomy output.  No other questions or concerns at this time.  Renal function now normalized.  States ready for discharge home.  Has already called for outpatient follow-up with her urologist, Dr. Logan Bores who will work her into the schedule.  Denies headache, no dizziness, no chest pain, no palpitations, no shortness of breath, no abdominal pain, no focal weakness, no fatigue, no fever/chills/night sweats, no nausea/vomiting, no paresthesias.  No acute events overnight per nursing staff.  Discharge Exam: Vitals:   02/18/23 0621 02/18/23 0949  BP: 126/73 137/86  Pulse: (!) 56 65  Resp: 16   Temp: 98.3 F (36.8 C)   SpO2: 96%    Vitals:   02/17/23  1236 02/17/23 1952 02/18/23 0621 02/18/23 0949  BP: 114/72 119/69 126/73 137/86  Pulse: (!) 56 (!) 57 (!) 56 65  Resp: 16 16 16    Temp: 98 F (36.7 C) 97.8 F (36.6 C) 98.3 F (36.8 C)   TempSrc: Oral Oral Oral   SpO2: 98% 94% 96%   Weight:      Height:        Physical Exam: GEN: NAD, alert and oriented x 3, obese HEENT: NCAT, PERRL, EOMI, sclera clear, MMM PULM: CTAB w/o wheezes/crackles, normal respiratory effort, on room air CV: RRR w/o M/G/R GI: abd soft, NTND, NABS, no R/G/M, ostomy noted with soft brown stool in collection bag MSK: no peripheral edema, muscle strength globally intact 5/5 bilateral upper/lower extremities NEURO: CN II-XII intact, no focal deficits, sensation to light touch intact PSYCH: normal mood/affect Integumentary: dry/intact, no rashes or wounds    The results of significant diagnostics from this hospitalization (including imaging, microbiology, ancillary and laboratory) are listed below for reference.     Microbiology: Recent Results (from the past 240 hour(s))  Urine  Culture (for pregnant, neutropenic or urologic patients or patients with an indwelling urinary catheter)     Status: Abnormal   Collection Time: 02/14/23  7:45 PM   Specimen: Urine, Clean Catch  Result Value Ref Range Status   Specimen Description   Final    URINE, CLEAN CATCH Performed at [facility], [address] [place], [place] [address]    Special Requests   Final    NONE Performed at [facility], [address], [place], [place] [address]    Culture MULTIPLE SPECIES PRESENT, SUGGEST RECOLLECTION (A)  Final   Report Status 02/16/2023 FINAL  Final     Labs: BNP (last 3 results) No results for input(s): "BNP" in the last 8760 hours. Basic Metabolic Panel: Recent Labs  Lab 02/14/23 1946 02/16/23 1359 02/17/23 0541 02/18/23 0555  NA 136 136 135 139  K 3.7 4.6 4.5 4.1  CL 101 100 101 103  CO2 25 26 25 27   GLUCOSE 133* 166* 133* 92  BUN 21* 16 18 17   CREATININE 1.84* 1.33* 1.15* 0.94  CALCIUM 9.4 9.4 9.1 8.9   Liver Function Tests: Recent Labs  Lab 02/14/23 1946  AST 13*  ALT 13  ALKPHOS 73  BILITOT 0.3  PROT 8.2*  ALBUMIN 3.7   Recent Labs  Lab 02/14/23 1946  LIPASE 28   No results for input(s): "AMMONIA" in the last 168 hours. CBC: Recent Labs  Lab 02/14/23 1946 02/16/23 1359 02/17/23 0541  WBC 9.8 8.0 8.1  HGB 12.0 11.2* 10.7*  HCT 38.8 37.6 34.5*  MCV 84.7 87.0 83.3  PLT 341 276 PLATELET CLUMPS NOTED ON SMEAR, UNABLE TO ESTIMATE   Cardiac Enzymes: No results for input(s): "CKTOTAL", "CKMB", "CKMBINDEX", "TROPONINI" in the last 168 hours. BNP: Invalid input(s): "POCBNP" CBG: No results for input(s): "GLUCAP" in the last 168 hours. D-Dimer No results for input(s): "DDIMER" in the last 72 hours. Hgb A1c No results for input(s): "HGBA1C" in the last 72 hours. Lipid Profile No results for input(s): "CHOL", "HDL", "LDLCALC", "TRIG", "CHOLHDL", "LDLDIRECT" in the last 72 hours. Thyroid function  studies No results for input(s): "TSH", "T4TOTAL", "T3FREE", "THYROIDAB" in the last 72 hours.  Invalid input(s): "FREET3" Anemia work up No results for input(s): "VITAMINB12", "FOLATE", "FERRITIN", "TIBC", "IRON", "RETICCTPCT" in the last 72 hours. Urinalysis    Component Value Date/Time   COLORURINE YELLOW 02/14/2023 1946  APPEARANCEUR HAZY (A) 02/14/2023 1946   LABSPEC 1.018 02/14/2023 1946   LABSPEC 1.010 02/26/2016 1604   PHURINE 5.0 02/14/2023 1946   GLUCOSEU NEGATIVE 02/14/2023 1946   GLUCOSEU Negative 02/26/2016 1604   HGBUR LARGE (A) 02/14/2023 1946   BILIRUBINUR NEGATIVE 02/14/2023 1946   BILIRUBINUR Negative 02/26/2016 1604   KETONESUR NEGATIVE 02/14/2023 1946   PROTEINUR >=300 (A) 02/14/2023 1946   UROBILINOGEN 0.2 02/26/2016 1604   NITRITE NEGATIVE 02/14/2023 1946   LEUKOCYTESUR MODERATE (A) 02/14/2023 1946   LEUKOCYTESUR Trace 02/26/2016 1604   Sepsis Labs Recent Labs  Lab 02/14/23 1946 02/16/23 1359 02/17/23 0541  WBC 9.8 8.0 8.1   Microbiology Recent Results (from the past 240 hour(s))  Urine Culture (for pregnant, neutropenic or urologic patients or patients with an indwelling urinary catheter)     Status: Abnormal   Collection Time: 02/14/23  7:45 PM   Specimen: Urine, Clean Catch  Result Value Ref Range Status   Specimen Description   Final    URINE, CLEAN CATCH Performed at [facility], [address], [place], [place] [address]    Special Requests   Final    NONE Performed at [facility], [address], [place], [place] [address]    Culture MULTIPLE SPECIES PRESENT, SUGGEST RECOLLECTION (A)  Final   Report Status 02/16/2023 FINAL  Final     Time coordinating discharge: Over 30 minutes  SIGNED:   Alvira Philips [place], DO  [facility] 02/18/2023, 9:56 AM

## 2023-02-23 ENCOUNTER — Other Ambulatory Visit: Payer: Self-pay

## 2023-02-23 ENCOUNTER — Emergency Department (HOSPITAL_BASED_OUTPATIENT_CLINIC_OR_DEPARTMENT_OTHER): Payer: Medicare HMO

## 2023-02-23 ENCOUNTER — Emergency Department (HOSPITAL_BASED_OUTPATIENT_CLINIC_OR_DEPARTMENT_OTHER): Payer: Medicare HMO | Admitting: Radiology

## 2023-02-23 ENCOUNTER — Emergency Department (HOSPITAL_BASED_OUTPATIENT_CLINIC_OR_DEPARTMENT_OTHER)
Admission: EM | Admit: 2023-02-23 | Discharge: 2023-02-23 | Disposition: A | Discharge status: Home / Self Care | Payer: Medicare HMO | Attending: Emergency Medicine | Admitting: Emergency Medicine

## 2023-02-23 ENCOUNTER — Encounter (HOSPITAL_BASED_OUTPATIENT_CLINIC_OR_DEPARTMENT_OTHER): Payer: Self-pay

## 2023-02-23 DIAGNOSIS — R1030 Lower abdominal pain, unspecified: Secondary | ICD-10-CM | POA: Diagnosis not present

## 2023-02-23 DIAGNOSIS — K5090 Crohn's disease, unspecified, without complications: Secondary | ICD-10-CM | POA: Insufficient documentation

## 2023-02-23 DIAGNOSIS — Z79899 Other long term (current) drug therapy: Secondary | ICD-10-CM | POA: Diagnosis not present

## 2023-02-23 DIAGNOSIS — N1339 Other hydronephrosis: Secondary | ICD-10-CM | POA: Diagnosis not present

## 2023-02-23 DIAGNOSIS — R590 Localized enlarged lymph nodes: Secondary | ICD-10-CM | POA: Diagnosis not present

## 2023-02-23 DIAGNOSIS — R0602 Shortness of breath: Secondary | ICD-10-CM | POA: Diagnosis not present

## 2023-02-23 DIAGNOSIS — R079 Chest pain, unspecified: Secondary | ICD-10-CM | POA: Insufficient documentation

## 2023-02-23 DIAGNOSIS — R0789 Other chest pain: Secondary | ICD-10-CM | POA: Diagnosis not present

## 2023-02-23 DIAGNOSIS — K760 Fatty (change of) liver, not elsewhere classified: Secondary | ICD-10-CM | POA: Insufficient documentation

## 2023-02-23 DIAGNOSIS — R110 Nausea: Secondary | ICD-10-CM | POA: Diagnosis present

## 2023-02-23 DIAGNOSIS — N1330 Unspecified hydronephrosis: Secondary | ICD-10-CM | POA: Diagnosis not present

## 2023-02-23 DIAGNOSIS — C20 Malignant neoplasm of rectum: Secondary | ICD-10-CM | POA: Diagnosis not present

## 2023-02-23 DIAGNOSIS — I2699 Other pulmonary embolism without acute cor pulmonale: Secondary | ICD-10-CM | POA: Diagnosis not present

## 2023-02-23 LAB — URINALYSIS, ROUTINE W REFLEX MICROSCOPIC
Bilirubin Urine: NEGATIVE
Glucose, UA: NEGATIVE mg/dL
Ketones, ur: 15 mg/dL — AB
Nitrite: NEGATIVE
Protein, ur: >300 mg/dL — AB
RBC / HPF: >50 RBC/hpf (ref 0–5)
Specific Gravity, Urine: 1.042 — ABNORMAL HIGH (ref 1.005–1.030)
pH: 6 (ref 5.0–8.0)

## 2023-02-23 LAB — BASIC METABOLIC PANEL
Anion gap: 16 — ABNORMAL HIGH (ref 5–15)
BUN: 15 mg/dL (ref 6–20)
CO2: 21 mmol/L — ABNORMAL LOW (ref 22–32)
Calcium: 11.2 mg/dL — ABNORMAL HIGH (ref 8.9–10.3)
Chloride: 100 mmol/L (ref 98–111)
Creatinine, Ser: 1.28 mg/dL — ABNORMAL HIGH (ref 0.44–1.00)
GFR, Estimated: 49 mL/min — ABNORMAL LOW (ref >60)
Glucose, Bld: 145 mg/dL — ABNORMAL HIGH (ref 70–99)
Potassium: 4.2 mmol/L (ref 3.5–5.1)
Sodium: 137 mmol/L (ref 135–145)

## 2023-02-23 LAB — CBC
HCT: 46 % (ref 36.0–46.0)
Hemoglobin: 14.5 g/dL (ref 12.0–15.0)
MCH: 25.8 pg — ABNORMAL LOW (ref 26.0–34.0)
MCHC: 31.5 g/dL (ref 30.0–36.0)
MCV: 81.7 fL (ref 80.0–100.0)
Platelets: 437 10*3/uL — ABNORMAL HIGH (ref 150–400)
RBC: 5.63 MIL/uL — ABNORMAL HIGH (ref 3.87–5.11)
RDW: 15.4 % (ref 11.5–15.5)
WBC: 10.2 10*3/uL (ref 4.0–10.5)
nRBC: 0 % (ref 0.0–0.2)

## 2023-02-23 LAB — TROPONIN I (HIGH SENSITIVITY)
Troponin I (High Sensitivity): 6 ng/L (ref <18)
Troponin I (High Sensitivity): 9 ng/L (ref <18)

## 2023-02-23 MED ORDER — HYDROCODONE-ACETAMINOPHEN 5-325 MG PO TABS: 2.0000 | ORAL_TABLET | ORAL | 0 refills | Status: DC | PRN

## 2023-02-23 MED ORDER — HYDROMORPHONE HCL 1 MG/ML IJ SOLN
1.0000 mg | Freq: Once | INTRAMUSCULAR | Status: AC
  Administered 2023-02-23: 1 mg via INTRAVENOUS
  Filled 2023-02-23: qty 1

## 2023-02-23 MED ORDER — ONDANSETRON 4 MG PO TBDP
4.0000 mg | ORAL_TABLET | Freq: Once | ORAL | Status: AC
  Administered 2023-02-23: 4 mg via ORAL
  Filled 2023-02-23: qty 1

## 2023-02-23 MED ORDER — IOHEXOL 350 MG/ML SOLN
100.0000 mL | Freq: Once | INTRAVENOUS | Status: AC | PRN
  Administered 2023-02-23: 100 mL via INTRAVENOUS

## 2023-02-23 MED ORDER — LACTATED RINGERS IV BOLUS
1000.0000 mL | Freq: Once | INTRAVENOUS | Status: AC
  Administered 2023-02-23: 1000 mL via INTRAVENOUS

## 2023-02-23 MED ORDER — SODIUM CHLORIDE 0.9 % IV BOLUS
1000.0000 mL | Freq: Once | INTRAVENOUS | Status: AC
  Administered 2023-02-23: 1000 mL via INTRAVENOUS

## 2023-02-23 MED ORDER — ONDANSETRON HCL 4 MG/2ML IJ SOLN
4.0000 mg | Freq: Once | INTRAMUSCULAR | Status: AC
  Administered 2023-02-23: 4 mg via INTRAVENOUS
  Filled 2023-02-23: qty 2

## 2023-02-23 MED ORDER — MORPHINE SULFATE (PF) 4 MG/ML IV SOLN
4.0000 mg | Freq: Once | INTRAVENOUS | Status: AC
  Administered 2023-02-23: 4 mg via INTRAVENOUS
  Filled 2023-02-23: qty 1

## 2023-02-23 NOTE — ED Triage Notes (Signed)
Pt c/o SHOB, nausea, "little bit of middle CP" onset last night, nausea same but continuing/ worsening- zofran didn't help. Pt reports zofran at 10a, "no help."

## 2023-02-23 NOTE — Discharge Instructions (Addendum)
You were seen in the ER today for chest pain, nausea, and shortness of breath. Thankfully your labs were largely reassuring, but there was mild dehydration noted. Your urine was sent off for culture as there could still be a lingering infection, but this does not appear to be the cause of your symptoms.  Your CT scans were also thankfully reassuring with no signs of a pulmonary embolism and improvement in your kidney function. Continue to hydrate as best you can. If symptoms worsen, please return to the ER.

## 2023-02-23 NOTE — ED Provider Notes (Signed)
 [facility] AT [facility] Provider Note   CSN: [id number] Arrival date & time: 02/23/23  1325     History Chief Complaint  Patient presents with   Chest Pain   Nausea   Shortness of Breath    Lindsay Little is a 57 y.o. female.  Patient with past history significant for Crohn's disease, SBO, acid reflux presents to the emergency department complaints of chest pain, nausea, shortness of breath.  Reports that the symptoms have been ongoing with onset beginning about last night or so.  Patient was recently discharged in the hospital following findings of AKI.  States that she is not eating or drinking well due to her current symptoms.  Denies any changes in urine habit.  States that her ostomy bag is still functioning as expected with no sudden decrease in stool burden.  Has tried taking Zofran at home without significant improvement.  Denies any vomiting.   Chest Pain Associated symptoms: nausea and shortness of breath   Shortness of Breath Associated symptoms: chest pain        Home Medications Prior to Admission medications   Medication Sig Start Date End Date Taking? Authorizing Provider  HYDROcodone-acetaminophen (NORCO/VICODIN) 5-325 MG tablet Take 2 tablets by mouth every 4 (four) hours as needed. 02/23/23  Yes Smitty Knudsen, PA-C  acetaminophen (TYLENOL) 500 MG tablet Take 1,000 mg by mouth as needed for moderate pain.    [provider]  BELBUCA 750 MCG FILM Place 750 mcg inside cheek in the morning and at bedtime. 02/03/23 04/04/23  [provider]  bisoprolol (ZEBETA) 5 MG tablet Take 5 mg by mouth daily.    [provider]  Cholecalciferol (VITAMIN D3) 5000 units CAPS Take 5,000 Units by mouth at bedtime.     [provider]  Coenzyme Q10 (COQ10) 200 MG CAPS Take 200 mg by mouth at bedtime.    [provider]  DULoxetine (CYMBALTA) 60 MG capsule Take 120 mg by mouth every evening.    [provider]  esomeprazole (NEXIUM) 40 MG capsule Take 1 capsule (40 mg total) by mouth 2 (two) times daily before a meal. Patient taking differently: Take 40 mg by mouth daily. May take an additional 40 mg as needed for reflux 12/22/15   [name], MD  famotidine (PEPCID) 20 MG tablet Take 20 mg by mouth 2 (two) times daily as needed for indigestion or heartburn. 08/28/22 02/24/23  [provider]  furosemide (LASIX) 20 MG tablet Take 20 mg by mouth daily as needed for edema.     [provider]  HYDROcodone-acetaminophen (NORCO/VICODIN) 5-325 MG tablet Take 1 tablet by mouth 2 (two) times daily as needed (break through pain).    [provider]  hyoscyamine (LEVSIN) 0.125 MG tablet Take 0.125 mg by mouth 3 (three) times daily as needed for cramping. 01/28/23   [provider]  levETIRAcetam (KEPPRA) 250 MG tablet Take 500 mg by mouth 2 (two) times daily.    [provider]  levothyroxine (SYNTHROID) 50 MCG tablet Take 50 mcg by mouth every morning. 02/07/22   [provider]  Melatonin 3 MG TABS Take 3-6 mg by mouth at bedtime as needed (sleep).    [provider]  Multiple Vitamin (MULTIVITAMIN WITH MINERALS) TABS tablet Take 2 tablets by mouth at bedtime.     [provider]  naloxone [facility]) nasal spray 4 mg/0.1 mL Place 0.4 mg into the nose once. 12/18/22  [provider]  oxybutynin (DITROPAN XL) 15 MG 24 hr tablet Take 15 mg by mouth daily.    [provider]  polyethylene glycol (MIRALAX / GLYCOLAX) 17 g packet Take 17 g by mouth 2 (two) times daily.    [provider]  pregabalin (LYRICA) 225 MG capsule Take 225 mg by mouth 2 (two) times daily. 02/07/22   [provider]  promethazine (PHENERGAN) 25 MG tablet TAKE ONE TABLET BY MOUTH EVERY 6 HOURS AS NEEDED FOR NAUSEA OR VOMITING Patient taking differently: Take 25 mg by mouth as needed for nausea or vomiting. 07/04/20   Daiva Eves,  Lisette Grinder, MD  rosuvastatin (CRESTOR) 5 MG tablet Take 5 mg by mouth at bedtime.    [provider]  tamsulosin (FLOMAX) 0.4 MG CAPS capsule Take 0.4 mg by mouth daily. 12/20/22 03/20/23  [provider]  triamcinolone lotion (KENALOG) 0.1 % Apply 1 application topically as needed (dry skin).    [provider]      Allergies    Caine-1 [lidocaine], Bupropion, Sulfa antibiotics, Pantoprazole, Adhesive [tape], Doxycycline, Flagyl [metronidazole], Iron, Metoprolol, Oxycodone, and Penicillins    Review of Systems   Review of Systems  Respiratory:  Positive for shortness of breath.   Cardiovascular:  Positive for chest pain.  Gastrointestinal:  Positive for nausea.  All other systems reviewed and are negative.   Physical Exam Updated Vital Signs BP 104/78   Pulse 95   Temp 98.7 F (37.1 C) (Oral)   Resp 15   SpO2 95%  Physical Exam Vitals and nursing note reviewed.  Constitutional:      General: She is not in acute distress.    Appearance: She is well-developed.  HENT:     Head: Normocephalic and atraumatic.  Eyes:     Conjunctiva/sclera: Conjunctivae normal.  Cardiovascular:     Rate and Rhythm: Normal rate and regular rhythm.     Heart sounds: No murmur heard. Pulmonary:     Effort: Pulmonary effort is normal. No respiratory distress.     Breath sounds: Normal breath sounds.  Abdominal:     Palpations: Abdomen is soft.     Tenderness: There is no abdominal tenderness.  Musculoskeletal:        General: No swelling.     Cervical back: Neck supple.  Skin:    General: Skin is warm and dry.     Capillary Refill: Capillary refill takes less than 2 seconds.  Neurological:     Mental Status: She is alert.  Psychiatric:        Mood and Affect: Mood normal.     ED Results / Procedures / Treatments   Labs (all labs ordered are listed, but only abnormal results are displayed) Labs Reviewed  BASIC METABOLIC PANEL - Abnormal; Notable for the  following components:      Result Value   CO2 21 (*)    Glucose, Bld 145 (*)    Creatinine, Ser 1.28 (*)    Calcium 11.2 (*)    GFR, Estimated 49 (*)    Anion gap 16 (*)    All other components within normal limits  CBC - Abnormal; Notable for the following components:   RBC 5.63 (*)    MCH 25.8 (*)    Platelets 437 (*)    All other components within normal limits  URINALYSIS, ROUTINE W REFLEX MICROSCOPIC - Abnormal; Notable for the following components:   Specific Gravity, Urine 1.042 (*)    Hgb urine  dipstick LARGE (*)    Ketones, ur 15 (*)    Protein, ur >300 (*)    Leukocytes,Ua SMALL (*)    Bacteria, UA RARE (*)    All other components within normal limits  URINE CULTURE  TROPONIN I (HIGH SENSITIVITY)  TROPONIN I (HIGH SENSITIVITY)    EKG EKG Interpretation Date/Time:  Sunday February 23 2023 13:36:03 EDT Ventricular Rate:  141 PR Interval:  144 QRS Duration:  80 QT Interval:  280 QTC Calculation: 428 R Axis:   2  Text Interpretation: Sinus tachycardia vs. aflutter Possible Anterior infarct , age undetermined Abnormal ECG When compared with ECG of 08-Nov-2022 22:49, PREVIOUS ECG IS PRESENT new changes Confirmed by Derwood Kaplan ([id number]) on 02/23/2023 3:46:17 PM  Radiology CT ABDOMEN PELVIS W CONTRAST  Result Date: 02/23/2023 CLINICAL DATA:  Acute abdominal pain and nausea. Previous low anterior resection for rectal carcinoma. * Tracking Code: BO * EXAM: CT ABDOMEN AND PELVIS WITH CONTRAST TECHNIQUE: Multidetector CT imaging of the abdomen and pelvis was performed using the standard protocol following bolus administration of intravenous contrast. RADIATION DOSE REDUCTION: This exam was performed according to the departmental dose-optimization program which includes automated exposure control, adjustment of the mA and/or kV according to patient size and/or use of iterative reconstruction technique. CONTRAST:  OMNIPAQUE IOHEXOL 350 MG/ML SOLN COMPARISON:  02/15/2023  FINDINGS: Lower Chest: No acute findings. Hepatobiliary: No suspicious hepatic masses identified. Mild diffuse hepatic steatosis again noted. Gallbladder is unremarkable. No evidence of biliary ductal dilatation. Pancreas:  No mass or inflammatory changes. Spleen: Within normal limits in size and appearance. Adrenals/Urinary Tract: No suspicious masses identified. Left ureteral stent is seen in appropriate position with resolution of left hydronephrosis since prior exam. Right ureteral stent remains in appropriate position, however moderate right hydronephrosis remains stable. Stomach/Bowel: Postop changes from low anterior resection with left abdominal colostomy again seen. Horseshoe shaped fluid and gas collection with surrounding soft tissue density in the presacral region shows no significant change. Tethering of cecum and small bowel loop again seen in this area, and fistulous communication cannot be excluded. No evidence of bowel obstruction. Vascular/Lymphatic: Mild right lower quadrant mesenteric lymphadenopathy remains stable, with largest lymph node measuring 10 mm on image 61/2. Shotty sub-cm lymph nodes in lower abdominal retroperitoneum and bilateral iliac chains remains stable. No new or increased sites of lymphadenopathy identified. No acute vascular findings. Reproductive:  Prior hysterectomy. Other:  None. Musculoskeletal:  No suspicious bone lesions identified. IMPRESSION: No acute findings. Resolution of left hydronephrosis since prior exam, with left ureteral stent in appropriate position. Stable moderate right hydronephrosis, with right ureteral stent remaining in appropriate position. No significant change in presacral fluid and gas collection, with tethering of cecum and small bowel loop in this area. Fistulous communication cannot be excluded. Stable mild right lower quadrant mesenteric lymphadenopathy. No new or progressive sites of metastatic disease identified. Mild hepatic steatosis.  Electronically Signed   By: Danae Orleans M.D.   On: 02/23/2023 18:43   CT Angio Chest PE W and/or Wo Contrast  Result Date: 02/23/2023 CLINICAL DATA:  Chest pain and shortness of breath. High probability for pulmonary embolism. EXAM: CT ANGIOGRAPHY CHEST WITH CONTRAST TECHNIQUE: Multidetector CT imaging of the chest was performed using the standard protocol during bolus administration of intravenous contrast. Multiplanar CT image reconstructions and MIPs were obtained to evaluate the vascular anatomy. RADIATION DOSE REDUCTION: This exam was performed according to the departmental dose-optimization program which includes automated exposure control, adjustment of the mA  and/or kV according to patient size and/or use of iterative reconstruction technique. CONTRAST:  OMNIPAQUE IOHEXOL 350 MG/ML SOLN COMPARISON:  01/11/2021 FINDINGS: Cardiovascular: Satisfactory opacification of pulmonary arteries noted, and no pulmonary emboli identified. No evidence of thoracic aortic dissection or aneurysm. Mediastinum/Nodes: No masses or pathologically enlarged lymph nodes identified. Lungs/Pleura: No pulmonary mass, infiltrate, or effusion. Upper abdomen: No acute findings. Musculoskeletal: No suspicious bone lesions identified. Review of the MIP images confirms the above findings. IMPRESSION: Negative. No evidence of pulmonary embolism or other active disease. Electronically Signed   By: Danae Orleans M.D.   On: 02/23/2023 18:31   DG Chest 2 View  Result Date: 02/23/2023 CLINICAL DATA:  Mid chest pain, short of breath EXAM: CHEST - 2 VIEW COMPARISON:  None Available. FINDINGS: Normal mediastinum and cardiac silhouette. Normal pulmonary vasculature. No evidence of effusion, infiltrate, or pneumothorax. No acute bony abnormality. IMPRESSION: No acute cardiopulmonary process. Electronically Signed   By: Genevive Bi M.D.   On: 02/23/2023 14:31    Procedures Procedures   Medications Ordered in ED Medications   ondansetron (ZOFRAN-ODT) disintegrating tablet 4 mg (4 mg Oral Given 02/23/23 1344)  sodium chloride 0.9 % bolus 1,000 mL (0 mLs Intravenous Stopped 02/23/23 1618)  morphine (PF) 4 MG/ML injection 4 mg (4 mg Intravenous Given 02/23/23 1519)  ondansetron (ZOFRAN) injection 4 mg (4 mg Intravenous Given 02/23/23 1519)  lactated ringers bolus 1,000 mL (0 mLs Intravenous Stopped 02/23/23 1949)  morphine (PF) 4 MG/ML injection 4 mg (4 mg Intravenous Given 02/23/23 1800)  iohexol (OMNIPAQUE) 350 MG/ML injection 100 mL (100 mLs Intravenous Contrast Given 02/23/23 1740)  HYDROmorphone (DILAUDID) injection 1 mg (1 mg Intravenous Given 02/23/23 1923)    ED Course/ Medical Decision Making/ A&P                               Medical Decision Making Amount and/or Complexity of Data Reviewed Labs: ordered. Radiology: ordered.  Risk Prescription drug management.   This patient presents to the ED for concern of chest pain, nausea, shortness of breath.  Differential diagnosis includes ACS, PE, bowel obstruction, AKI, sepsis   Imaging Studies ordered:  I ordered imaging studies including chest x-ray, CT abdomen pelvis, CT angio chest I independently visualized and interpreted imaging which showed no acute cardiopulmonary process, right-sided hydronephrosis persists although stent is in place, left-sided hydronephrosis improving, presacral fluid and gas unchanged from previous, no evidence of PE or active disease process in the chest. I agree with the radiologist interpretation   Medicines ordered and prescription drug management:  I ordered medication including fluids, Zofran, morphine, Dilaudid for dehydration, tachycardia, nausea, pain Reevaluation of the patient after these medicines showed that the patient improved I have reviewed the patients home medicines and have made adjustments as needed   Problem List / ED Course:  Patient presented to the emergency department concerns of chest pain,  nausea, shortness of breath. Patient recently seen in ED and admitted for concerns of UTI with worsening hydronephrosis secondary to ureteral stent malpositioning.  Currently presents to the emergency department endorsing chest pain, shortness of breath, and nausea.  Patient has considerable past medical history with recent admissions and surgery.  Low threshold for possible admission for worsening symptoms or progression of any acute condition.  Will initiate workup including basic labs, imaging, and symptomatic treatment with fluids and pain control. Patient's labs are somewhat unremarkable without any acute finding of infection based  on white count at 10.2, BMP is unremarkable although some slight worsening kidney function likely secondary due to dehydration from poor oral intake, troponin negative and delta troponin also unremarkable.  Urine culture pending following abnormal UA findings with large blood still present, however white blood cells are improving and rare bacteria still noted.  Will send out for urine culture given that last urine culture was likely contaminated and unable to distinguish between any particular bacterial source.  Given continued pain in lower abdomen and chest pain shortness of breath, will order CT angio and CT abdomen pelvis for further evaluation of symptoms.  Patient reports some mild pain control with morphine.  No longer endorsing nausea. CTA angio negative for any PE or other acute cardiopulmonary abnormality.  CT abdomen pelvis continues to show hydronephrosis of the right kidney unchanged but improvement in hydronephrosis of the left kidney.  No other acute abdominal anomaly to cover patient's symptoms.  Discussed with attending patient's case and rather complex past history.  Given improvement in CT abdomen pelvis findings, patient is likely safe for discharge home with outpatient follow-up with treatment team.  Again unsure if patient's current UA findings are consistent  with infection or other potential source given the patient is not currently exhibiting urinary symptoms.  Will send off for culture.  Informed patient of largely reassuring lab workup and patient has improved symptomatically up to this point.  Will discharge patient home with Norco.  Patient agreeable to treatment plan and verbalized understanding all return precautions.  All questions answered prior to patient discharge.  Final Clinical Impression(s) / ED Diagnoses Final diagnoses:  Lower abdominal pain  Other hydronephrosis    Rx / DC Orders ED Discharge Orders          Ordered    HYDROcodone-acetaminophen (NORCO/VICODIN) 5-325 MG tablet  Every 4 hours PRN        02/23/23 2151              Smitty Knudsen, PA-C 02/23/23 2355    Derwood Kaplan, MD 02/24/23 [phone]

## 2023-02-25 LAB — URINE CULTURE
Culture: <10000 — AB
Special Requests: NORMAL

## 2023-02-26 DIAGNOSIS — G894 Chronic pain syndrome: Secondary | ICD-10-CM | POA: Diagnosis not present

## 2023-02-26 DIAGNOSIS — M25551 Pain in right hip: Secondary | ICD-10-CM | POA: Diagnosis not present

## 2023-02-26 DIAGNOSIS — M961 Postlaminectomy syndrome, not elsewhere classified: Secondary | ICD-10-CM | POA: Diagnosis not present

## 2023-02-26 DIAGNOSIS — M47816 Spondylosis without myelopathy or radiculopathy, lumbar region: Secondary | ICD-10-CM | POA: Diagnosis not present

## 2023-02-26 DIAGNOSIS — B0229 Other postherpetic nervous system involvement: Secondary | ICD-10-CM | POA: Diagnosis not present

## 2023-02-26 DIAGNOSIS — M5416 Radiculopathy, lumbar region: Secondary | ICD-10-CM | POA: Diagnosis not present

## 2023-02-26 DIAGNOSIS — N1339 Other hydronephrosis: Secondary | ICD-10-CM | POA: Diagnosis not present

## 2023-03-27 DIAGNOSIS — M5136 Other intervertebral disc degeneration, lumbar region: Secondary | ICD-10-CM | POA: Diagnosis not present

## 2023-03-27 DIAGNOSIS — M48061 Spinal stenosis, lumbar region without neurogenic claudication: Secondary | ICD-10-CM | POA: Diagnosis not present

## 2023-03-27 DIAGNOSIS — M461 Sacroiliitis, not elsewhere classified: Secondary | ICD-10-CM | POA: Diagnosis not present

## 2023-03-27 DIAGNOSIS — M25851 Other specified joint disorders, right hip: Secondary | ICD-10-CM | POA: Diagnosis not present

## 2023-03-27 DIAGNOSIS — Z9889 Other specified postprocedural states: Secondary | ICD-10-CM | POA: Diagnosis not present

## 2023-03-27 DIAGNOSIS — M25551 Pain in right hip: Secondary | ICD-10-CM | POA: Diagnosis not present

## 2023-03-27 DIAGNOSIS — M47817 Spondylosis without myelopathy or radiculopathy, lumbosacral region: Secondary | ICD-10-CM | POA: Diagnosis not present

## 2023-03-27 DIAGNOSIS — M47816 Spondylosis without myelopathy or radiculopathy, lumbar region: Secondary | ICD-10-CM | POA: Diagnosis not present

## 2023-04-07 ENCOUNTER — Emergency Department (HOSPITAL_COMMUNITY): Payer: Medicare HMO

## 2023-04-07 ENCOUNTER — Inpatient Hospital Stay (HOSPITAL_COMMUNITY)
Admission: EM | Admit: 2023-04-07 | Discharge: 2023-04-13 | DRG: 389 | Disposition: A | Discharge status: Home / Self Care | Payer: Medicare HMO | Attending: Internal Medicine | Admitting: Internal Medicine

## 2023-04-07 ENCOUNTER — Encounter (HOSPITAL_COMMUNITY): Payer: Self-pay | Admitting: Internal Medicine

## 2023-04-07 ENCOUNTER — Inpatient Hospital Stay (HOSPITAL_COMMUNITY): Payer: Medicare HMO

## 2023-04-07 ENCOUNTER — Other Ambulatory Visit: Payer: Self-pay

## 2023-04-07 DIAGNOSIS — E039 Hypothyroidism, unspecified: Secondary | ICD-10-CM | POA: Diagnosis present

## 2023-04-07 DIAGNOSIS — D75839 Thrombocytosis, unspecified: Secondary | ICD-10-CM | POA: Diagnosis present

## 2023-04-07 DIAGNOSIS — E782 Mixed hyperlipidemia: Secondary | ICD-10-CM | POA: Diagnosis not present

## 2023-04-07 DIAGNOSIS — Z765 Malingerer [conscious simulation]: Secondary | ICD-10-CM

## 2023-04-07 DIAGNOSIS — Z882 Allergy status to sulfonamides status: Secondary | ICD-10-CM

## 2023-04-07 DIAGNOSIS — R300 Dysuria: Secondary | ICD-10-CM | POA: Diagnosis present

## 2023-04-07 DIAGNOSIS — Z9049 Acquired absence of other specified parts of digestive tract: Secondary | ICD-10-CM

## 2023-04-07 DIAGNOSIS — F17210 Nicotine dependence, cigarettes, uncomplicated: Secondary | ICD-10-CM | POA: Diagnosis present

## 2023-04-07 DIAGNOSIS — G40909 Epilepsy, unspecified, not intractable, without status epilepticus: Secondary | ICD-10-CM

## 2023-04-07 DIAGNOSIS — T451X5A Adverse effect of antineoplastic and immunosuppressive drugs, initial encounter: Secondary | ICD-10-CM | POA: Diagnosis present

## 2023-04-07 DIAGNOSIS — G894 Chronic pain syndrome: Secondary | ICD-10-CM | POA: Diagnosis present

## 2023-04-07 DIAGNOSIS — F32A Depression, unspecified: Secondary | ICD-10-CM | POA: Diagnosis not present

## 2023-04-07 DIAGNOSIS — E559 Vitamin D deficiency, unspecified: Secondary | ICD-10-CM | POA: Diagnosis present

## 2023-04-07 DIAGNOSIS — Z79890 Hormone replacement therapy (postmenopausal): Secondary | ICD-10-CM | POA: Diagnosis not present

## 2023-04-07 DIAGNOSIS — E7800 Pure hypercholesterolemia, unspecified: Secondary | ICD-10-CM | POA: Diagnosis present

## 2023-04-07 DIAGNOSIS — Z800 Family history of malignant neoplasm of digestive organs: Secondary | ICD-10-CM

## 2023-04-07 DIAGNOSIS — I10 Essential (primary) hypertension: Secondary | ICD-10-CM | POA: Diagnosis not present

## 2023-04-07 DIAGNOSIS — Z5320 Procedure and treatment not carried out because of patient's decision for unspecified reasons: Secondary | ICD-10-CM | POA: Diagnosis not present

## 2023-04-07 DIAGNOSIS — Z923 Personal history of irradiation: Secondary | ICD-10-CM | POA: Diagnosis not present

## 2023-04-07 DIAGNOSIS — Z466 Encounter for fitting and adjustment of urinary device: Secondary | ICD-10-CM | POA: Diagnosis not present

## 2023-04-07 DIAGNOSIS — Z808 Family history of malignant neoplasm of other organs or systems: Secondary | ICD-10-CM

## 2023-04-07 DIAGNOSIS — C20 Malignant neoplasm of rectum: Secondary | ICD-10-CM | POA: Diagnosis not present

## 2023-04-07 DIAGNOSIS — K219 Gastro-esophageal reflux disease without esophagitis: Secondary | ICD-10-CM | POA: Diagnosis present

## 2023-04-07 DIAGNOSIS — K5090 Crohn's disease, unspecified, without complications: Secondary | ICD-10-CM | POA: Diagnosis present

## 2023-04-07 DIAGNOSIS — Z79899 Other long term (current) drug therapy: Secondary | ICD-10-CM | POA: Diagnosis not present

## 2023-04-07 DIAGNOSIS — Z803 Family history of malignant neoplasm of breast: Secondary | ICD-10-CM

## 2023-04-07 DIAGNOSIS — E785 Hyperlipidemia, unspecified: Secondary | ICD-10-CM | POA: Diagnosis present

## 2023-04-07 DIAGNOSIS — F419 Anxiety disorder, unspecified: Secondary | ICD-10-CM | POA: Diagnosis present

## 2023-04-07 DIAGNOSIS — R739 Hyperglycemia, unspecified: Secondary | ICD-10-CM | POA: Diagnosis not present

## 2023-04-07 DIAGNOSIS — K5669 Other partial intestinal obstruction: Secondary | ICD-10-CM | POA: Diagnosis not present

## 2023-04-07 DIAGNOSIS — Z85048 Personal history of other malignant neoplasm of rectum, rectosigmoid junction, and anus: Secondary | ICD-10-CM

## 2023-04-07 DIAGNOSIS — Z933 Colostomy status: Secondary | ICD-10-CM

## 2023-04-07 DIAGNOSIS — N739 Female pelvic inflammatory disease, unspecified: Secondary | ICD-10-CM | POA: Diagnosis present

## 2023-04-07 DIAGNOSIS — Z79891 Long term (current) use of opiate analgesic: Secondary | ICD-10-CM

## 2023-04-07 DIAGNOSIS — R1111 Vomiting without nausea: Secondary | ICD-10-CM | POA: Diagnosis not present

## 2023-04-07 DIAGNOSIS — Z833 Family history of diabetes mellitus: Secondary | ICD-10-CM

## 2023-04-07 DIAGNOSIS — Z5986 Financial insecurity: Secondary | ICD-10-CM

## 2023-04-07 DIAGNOSIS — Z8719 Personal history of other diseases of the digestive system: Secondary | ICD-10-CM

## 2023-04-07 DIAGNOSIS — Z91048 Other nonmedicinal substance allergy status: Secondary | ICD-10-CM

## 2023-04-07 DIAGNOSIS — E66813 Obesity, class 3: Secondary | ICD-10-CM | POA: Diagnosis present

## 2023-04-07 DIAGNOSIS — R000 Tachycardia, unspecified: Secondary | ICD-10-CM | POA: Diagnosis present

## 2023-04-07 DIAGNOSIS — Z823 Family history of stroke: Secondary | ICD-10-CM

## 2023-04-07 DIAGNOSIS — N1330 Unspecified hydronephrosis: Secondary | ICD-10-CM | POA: Diagnosis present

## 2023-04-07 DIAGNOSIS — Z604 Social exclusion and rejection: Secondary | ICD-10-CM | POA: Diagnosis present

## 2023-04-07 DIAGNOSIS — K56609 Unspecified intestinal obstruction, unspecified as to partial versus complete obstruction: Principal | ICD-10-CM | POA: Diagnosis present

## 2023-04-07 DIAGNOSIS — K56699 Other intestinal obstruction unspecified as to partial versus complete obstruction: Secondary | ICD-10-CM | POA: Diagnosis not present

## 2023-04-07 DIAGNOSIS — G620 Drug-induced polyneuropathy: Secondary | ICD-10-CM | POA: Diagnosis present

## 2023-04-07 DIAGNOSIS — I700 Atherosclerosis of aorta: Secondary | ICD-10-CM | POA: Diagnosis present

## 2023-04-07 DIAGNOSIS — K5903 Drug induced constipation: Secondary | ICD-10-CM | POA: Diagnosis present

## 2023-04-07 DIAGNOSIS — Z6841 Body Mass Index (BMI) 40.0 and over, adult: Secondary | ICD-10-CM | POA: Diagnosis not present

## 2023-04-07 DIAGNOSIS — Z90721 Acquired absence of ovaries, unilateral: Secondary | ICD-10-CM

## 2023-04-07 DIAGNOSIS — R6889 Other general symptoms and signs: Secondary | ICD-10-CM | POA: Diagnosis not present

## 2023-04-07 DIAGNOSIS — Z83438 Family history of other disorder of lipoprotein metabolism and other lipidemia: Secondary | ICD-10-CM

## 2023-04-07 DIAGNOSIS — R1084 Generalized abdominal pain: Secondary | ICD-10-CM | POA: Diagnosis not present

## 2023-04-07 DIAGNOSIS — I878 Other specified disorders of veins: Secondary | ICD-10-CM | POA: Diagnosis not present

## 2023-04-07 DIAGNOSIS — R1310 Dysphagia, unspecified: Secondary | ICD-10-CM | POA: Diagnosis present

## 2023-04-07 DIAGNOSIS — K589 Irritable bowel syndrome without diarrhea: Secondary | ICD-10-CM | POA: Diagnosis present

## 2023-04-07 DIAGNOSIS — T40605A Adverse effect of unspecified narcotics, initial encounter: Secondary | ICD-10-CM | POA: Diagnosis present

## 2023-04-07 DIAGNOSIS — R197 Diarrhea, unspecified: Secondary | ICD-10-CM | POA: Diagnosis not present

## 2023-04-07 DIAGNOSIS — Z885 Allergy status to narcotic agent status: Secondary | ICD-10-CM

## 2023-04-07 DIAGNOSIS — K2950 Unspecified chronic gastritis without bleeding: Secondary | ICD-10-CM | POA: Diagnosis present

## 2023-04-07 DIAGNOSIS — Z8249 Family history of ischemic heart disease and other diseases of the circulatory system: Secondary | ICD-10-CM

## 2023-04-07 DIAGNOSIS — R1900 Intra-abdominal and pelvic swelling, mass and lump, unspecified site: Secondary | ICD-10-CM | POA: Insufficient documentation

## 2023-04-07 DIAGNOSIS — Z888 Allergy status to other drugs, medicaments and biological substances status: Secondary | ICD-10-CM

## 2023-04-07 DIAGNOSIS — Z90710 Acquired absence of both cervix and uterus: Secondary | ICD-10-CM

## 2023-04-07 DIAGNOSIS — Z881 Allergy status to other antibiotic agents status: Secondary | ICD-10-CM

## 2023-04-07 DIAGNOSIS — M159 Polyosteoarthritis, unspecified: Secondary | ICD-10-CM | POA: Diagnosis present

## 2023-04-07 DIAGNOSIS — Z880 Allergy status to penicillin: Secondary | ICD-10-CM

## 2023-04-07 LAB — COMPREHENSIVE METABOLIC PANEL
ALT: 16 U/L (ref 0–44)
AST: 17 U/L (ref 15–41)
Albumin: 3.8 g/dL (ref 3.5–5.0)
Alkaline Phosphatase: 73 U/L (ref 38–126)
Anion gap: 12 (ref 5–15)
BUN: 14 mg/dL (ref 6–20)
CO2: 24 mmol/L (ref 22–32)
Calcium: 10.1 mg/dL (ref 8.9–10.3)
Chloride: 101 mmol/L (ref 98–111)
Creatinine, Ser: 0.67 mg/dL (ref 0.44–1.00)
GFR, Estimated: >60 mL/min (ref >60)
Glucose, Bld: 154 mg/dL — ABNORMAL HIGH (ref 70–99)
Potassium: 3.6 mmol/L (ref 3.5–5.1)
Sodium: 137 mmol/L (ref 135–145)
Total Bilirubin: 0.5 mg/dL (ref 0.3–1.2)
Total Protein: 9.3 g/dL — ABNORMAL HIGH (ref 6.5–8.1)

## 2023-04-07 LAB — CBC WITH DIFFERENTIAL/PLATELET
Abs Immature Granulocytes: 0.05 10*3/uL (ref 0.00–0.07)
Basophils Absolute: 0 10*3/uL (ref 0.0–0.1)
Basophils Relative: 0 %
Eosinophils Absolute: 0.1 10*3/uL (ref 0.0–0.5)
Eosinophils Relative: 1 %
HCT: 45 % (ref 36.0–46.0)
Hemoglobin: 13.8 g/dL (ref 12.0–15.0)
Immature Granulocytes: 0 %
Lymphocytes Relative: 12 %
Lymphs Abs: 1.4 10*3/uL (ref 0.7–4.0)
MCH: 25.3 pg — ABNORMAL LOW (ref 26.0–34.0)
MCHC: 30.7 g/dL (ref 30.0–36.0)
MCV: 82.4 fL (ref 80.0–100.0)
Monocytes Absolute: 0.4 10*3/uL (ref 0.1–1.0)
Monocytes Relative: 4 %
Neutro Abs: 9.3 10*3/uL — ABNORMAL HIGH (ref 1.7–7.7)
Neutrophils Relative %: 83 %
Platelets: 477 10*3/uL — ABNORMAL HIGH (ref 150–400)
RBC: 5.46 MIL/uL — ABNORMAL HIGH (ref 3.87–5.11)
RDW: 15 % (ref 11.5–15.5)
WBC: 11.3 10*3/uL — ABNORMAL HIGH (ref 4.0–10.5)
nRBC: 0 % (ref 0.0–0.2)

## 2023-04-07 LAB — URINALYSIS, ROUTINE W REFLEX MICROSCOPIC
Bacteria, UA: NONE SEEN
Bilirubin Urine: NEGATIVE
Glucose, UA: NEGATIVE mg/dL
Ketones, ur: NEGATIVE mg/dL
Nitrite: NEGATIVE
Protein, ur: 100 mg/dL — AB
RBC / HPF: >50 RBC/hpf (ref 0–5)
Specific Gravity, Urine: >1.046 — ABNORMAL HIGH (ref 1.005–1.030)
WBC, UA: >50 WBC/hpf (ref 0–5)
pH: 8 (ref 5.0–8.0)

## 2023-04-07 LAB — MAGNESIUM: Magnesium: 1.9 mg/dL (ref 1.7–2.4)

## 2023-04-07 LAB — LIPASE, BLOOD: Lipase: 20 U/L (ref 11–51)

## 2023-04-07 MED ORDER — POTASSIUM CHLORIDE 10 MEQ/100ML IV SOLN
10.0000 meq | INTRAVENOUS | Status: DC
  Administered 2023-04-07: 10 meq via INTRAVENOUS
  Filled 2023-04-07: qty 100

## 2023-04-07 MED ORDER — SODIUM CHLORIDE 0.9 % IV BOLUS
1000.0000 mL | Freq: Once | INTRAVENOUS | Status: AC
  Administered 2023-04-07: 1000 mL via INTRAVENOUS

## 2023-04-07 MED ORDER — IOHEXOL 300 MG/ML  SOLN
100.0000 mL | Freq: Once | INTRAMUSCULAR | Status: AC | PRN
  Administered 2023-04-07: 100 mL via INTRAVENOUS

## 2023-04-07 MED ORDER — LACTATED RINGERS IV BOLUS
1000.0000 mL | Freq: Once | INTRAVENOUS | Status: AC
  Administered 2023-04-07: 1000 mL via INTRAVENOUS

## 2023-04-07 MED ORDER — ONDANSETRON HCL 4 MG/2ML IJ SOLN
4.0000 mg | Freq: Once | INTRAMUSCULAR | Status: AC
  Administered 2023-04-07: 4 mg via INTRAVENOUS
  Filled 2023-04-07: qty 2

## 2023-04-07 MED ORDER — FAMOTIDINE IN NACL 20-0.9 MG/50ML-% IV SOLN
20.0000 mg | INTRAVENOUS | Status: AC
  Administered 2023-04-07: 20 mg via INTRAVENOUS
  Filled 2023-04-07: qty 50

## 2023-04-07 MED ORDER — NALOXONE HCL 0.4 MG/ML IJ SOLN: 0.4000 mg | INTRAMUSCULAR | Status: DC | PRN

## 2023-04-07 MED ORDER — ONDANSETRON HCL 4 MG/2ML IJ SOLN
4.0000 mg | Freq: Four times a day (QID) | INTRAMUSCULAR | Status: DC | PRN
  Administered 2023-04-07 – 2023-04-09 (×6): 4 mg via INTRAVENOUS
  Filled 2023-04-07 (×6): qty 2

## 2023-04-07 MED ORDER — SODIUM CHLORIDE 0.9 % IV BOLUS
2000.0000 mL | Freq: Once | INTRAVENOUS | Status: AC
  Administered 2023-04-07: 2000 mL via INTRAVENOUS

## 2023-04-07 MED ORDER — LEVETIRACETAM IN NACL 500 MG/100ML IV SOLN
500.0000 mg | Freq: Two times a day (BID) | INTRAVENOUS | Status: DC
  Administered 2023-04-07 – 2023-04-08 (×3): 500 mg via INTRAVENOUS
  Filled 2023-04-07 (×4): qty 100

## 2023-04-07 MED ORDER — PROCHLORPERAZINE EDISYLATE 10 MG/2ML IJ SOLN
10.0000 mg | Freq: Four times a day (QID) | INTRAMUSCULAR | Status: DC | PRN
  Administered 2023-04-07: 10 mg via INTRAVENOUS
  Filled 2023-04-07: qty 2

## 2023-04-07 MED ORDER — POTASSIUM CHLORIDE IN NACL 20-0.9 MEQ/L-% IV SOLN
INTRAVENOUS | Status: AC
  Filled 2023-04-07 (×3): qty 1000

## 2023-04-07 MED ORDER — HYDROMORPHONE HCL 1 MG/ML IJ SOLN
0.5000 mg | INTRAMUSCULAR | Status: DC | PRN
  Administered 2023-04-07 – 2023-04-09 (×19): 0.5 mg via INTRAVENOUS
  Filled 2023-04-07 (×19): qty 0.5

## 2023-04-07 MED ORDER — MAGNESIUM SULFATE 2 GM/50ML IV SOLN
2.0000 g | Freq: Once | INTRAVENOUS | Status: AC
  Administered 2023-04-07: 2 g via INTRAVENOUS
  Filled 2023-04-07: qty 50

## 2023-04-07 MED ORDER — HYDRALAZINE HCL 20 MG/ML IJ SOLN: 10.0000 mg | INTRAMUSCULAR | Status: DC | PRN

## 2023-04-07 MED ORDER — ACETAMINOPHEN 650 MG RE SUPP: 650.0000 mg | Freq: Four times a day (QID) | RECTAL | Status: DC | PRN

## 2023-04-07 MED ORDER — METOCLOPRAMIDE HCL 5 MG/ML IJ SOLN
10.0000 mg | INTRAMUSCULAR | Status: AC
  Administered 2023-04-07: 10 mg via INTRAVENOUS
  Filled 2023-04-07: qty 2

## 2023-04-07 MED ORDER — LACTATED RINGERS IV SOLN: INTRAVENOUS | Status: DC

## 2023-04-07 MED ORDER — ACETAMINOPHEN 325 MG PO TABS
650.0000 mg | ORAL_TABLET | Freq: Four times a day (QID) | ORAL | Status: DC | PRN
  Administered 2023-04-10 – 2023-04-11 (×3): 650 mg via ORAL
  Filled 2023-04-07 (×3): qty 2

## 2023-04-07 MED ORDER — FAMOTIDINE IN NACL 20-0.9 MG/50ML-% IV SOLN
20.0000 mg | Freq: Two times a day (BID) | INTRAVENOUS | Status: DC
  Administered 2023-04-07 – 2023-04-11 (×8): 20 mg via INTRAVENOUS
  Filled 2023-04-07 (×8): qty 50

## 2023-04-07 MED ORDER — HYDROMORPHONE HCL 1 MG/ML IJ SOLN
1.0000 mg | Freq: Once | INTRAMUSCULAR | Status: AC
  Administered 2023-04-07: 1 mg via INTRAVENOUS
  Filled 2023-04-07: qty 1

## 2023-04-07 MED ORDER — POLYETHYLENE GLYCOL 3350 17 G PO PACK: 17.0000 g | PACK | Freq: Every day | ORAL | Status: DC

## 2023-04-07 NOTE — H&P (Signed)
History and Physical    Patient: Lindsay Little ZOX:[id number] DOB: January 24, 1966 DOA: 04/07/2023 DOS: the patient was seen and examined on 04/07/2023 PCP: Laurann Montana, MD  Patient coming from: Home  Chief Complaint:  Chief Complaint  Patient presents with   Abdominal Pain   HPI: Lindsay Little is a 57 y.o. female with medical history significant of anxiety, depression, chemotherapy-induced neuropathy, inflammatory bowel disease, colostomy, GERD, gastroenteritis, hiatal hernia, obesity, rectal cancer, status postsurgery and diverting colostomy who presented to the emergency department complaints of abdominal pain, nausea, vomiting and diarrhea associated with decreased colostomy output but no melanotic stools.  The patient stated that she has been having about 3-4 episodes of this per year.  She denied fever, chills, rhinorrhea, sore throat, wheezing or hemoptysis.  No chest pain, palpitations, diaphoresis, PND, orthopnea or pitting edema of the lower extremities. No flank pain, dysuria, frequency or hematuria.  No polyuria, polydipsia, polyphagia or blurred vision.   Lab work: Urine analysis showed a specific gravity of greater than 1.046, moderate hemoglobin, large leukocyte esterase, proteinuria of 100 mg/dL, greater than 50 RBC, greater than 50 WBCs with no bacteria seen.  CBC showed a white count 11.3, hemoglobin 13.8 g/dL platelets 811.  CMP showed a glucose of 154 mg/deciliter and total protein 9.3 g/dL.  Lipase was normal.  Imaging: CT abdomen/pelvis with contrast with findings suspicions for small bowel obstruction as the patient has an increased caliber of the proximal small bowel loop with air-fluid levels.  There is transition to decrease caliber distal small bowel is it identified within the lower abdomen.  Right sided hydronephrosis has improved.  Please see images and full radiology report for further details.  ED course: Initial vital signs were temperature 98.6 F, pulse 116,  respiration 18, BP 148/97 mmHg O2 sat 96% on room air.  Patient received LR 1000 mL liter bolus, normal saline 1000 mL bolus, ondansetron 4 mg IVP, metoclopramide 10 mg IVP, hydromorphone 1 mg IVP and famotidine 20 mg IVP.  I added magnesium sulfate 2 g IVPB and 2000 mL of normal saline bolus.   Review of Systems: As mentioned in the history of present illness. All other systems reviewed and are negative. Past Medical History:  Diagnosis Date   Anxiety    Chemotherapy-induced neuropathy (HCC)    toes and fingers numbness and tingling   Colorectal delayed anastomotic leak s/p resection & colostomy 07/12/2016 11/17/2015   Colostomy in place [facility]) 07/12/2016   due to anastomosis breakdown w/ colovaginal fistula   Colovaginal fistula s/p omentopexy repair 07/12/2016 07/10/2016   Complication of anesthesia    Depression    Family history of adverse reaction to anesthesia    mother-- ponv   Family history of breast cancer    Family history of pancreatic cancer    Family history of skin cancer    Gastroenteritis 12/20/2015   GERD (gastroesophageal reflux disease)    Hardware complicating wound infection (HCC) 08/03/2019   Hiatal hernia    History of cancer chemotherapy 02-20-2015 to 03-30-2015   History of cardiac murmur as a child    History of chemotherapy    History of chronic gastritis    History of hypertension    no issues since multiple abdminal sx's and chemo--- no medication since 12/ 2016   History of TMJ disorder    Hypercholesteremia    Hypertension    IBS (irritable bowel syndrome)    dx age 41   Intermittent abdominal pain  post-op multiple abdominal sx's    Microcytic anemia    Mild sleep apnea    per pt study 2014  very mild osa , no cpap recommended, recommeded wt loss and sleep routine   OA (osteoarthritis)    left knee /  left shoulder   Obesity    Parastomal hernia s/p lap repair w mesh 07/23/2018 07/23/2018   Pelvic abscess s/p drainage & omental pedicle flap  11/17/2015 06/23/2015   PONV (postoperative nausea and vomiting)    severe" needs Scopolamine PATCH"    Portacath in place    right chest   Rectal adenocarcinoma (HCC) oncologis-  dr Mosetta Putt-- after radiation/ chemo (ypT1, N0) --  no recurrence per last note 03/ 2018   dx 01-13-2015-- Stage IIIC (T3, N2, M0) post concurrent radiation and chemotherapy 02-20-2015 to 03-30-2015 /  05-25-2015 s/p  LAR w/ RSO (post-op complicated by late abscess and contained anatomotic leak w/ help percutaneous drainage and antibiotics)    Rectocutaneous fistula 07/10/2016   Rotator cuff tear, left    S/P radiation therapy 02/20/15-03/30/15   colon/rectal   Vitamin D deficiency    Wears glasses    Wears glasses    Past Surgical History:  Procedure Laterality Date   ABDOMINAL HYSTERECTOMY  1996   uterus and cervix   BACK SURGERY  02/12/2018   lumbar surgery   COLON RESECTION N/A 07/12/2016   Procedure: LAPAROSCOPIC LYSIS OF ADHESIONS, OMENTOPEXY, HAND ASSISTED RESECTION OF  COLON, END TO END ANASTOMOSIS, COLOSTOMY;  Surgeon: [name], MD;  Location: [facility];  Service: General;  Laterality: N/A;   COMBINED HYSTEROSCOPY DIAGNOSTIC / D&C  x2 1990's   CYSTOSCOPY W/ URETERAL STENT PLACEMENT Bilateral 02/16/2023   Procedure: CYSTOSCOPY WITH RETROGRADE PYELOGRAM BILATERAL STENT EXCHANGE;  Surgeon: Joline Maxcy, MD;  Location: [facility];  Service: Urology;  Laterality: Bilateral;   DIAGNOSTIC LAPAROSCOPY  age 70 and age 57   EUS N/A 01/18/2015   Procedure: LOWER ENDOSCOPIC ULTRASOUND (EUS);  Surgeon: Willis Modena, MD;  Location: [facility];  Service: Endoscopy;  Laterality: N/A;   EXCISION OF SKIN TAG  11/17/2015   Procedure: EXCISION OF SKIN TAG;  Surgeon: [name], MD;  Location: [facility];  Service: General;;   ILEO LOOP COLOSTOMY CLOSURE N/A 11/17/2015   Procedure: LAPAROSCOPIC DIVERTING LOOP ILEOSTOMY  DRAINAGE OF PELVIC ABSCESS;  Surgeon: [name], MD;  Location: [facility];  Service: General;  Laterality: N/A;    ILEOSTOMY CLOSURE N/A 05/31/2016   Procedure: TAKEDOWN LOOP ILEOSTOMY;  Surgeon: [name], MD;  Location: [facility];  Service: General;  Laterality: N/A;   IMPACTION REMOVAL  11/17/2015   Procedure: DISIMPACTION REMOVAL;  Surgeon: [name], MD;  Location: [facility];  Service: General;;   INSERTION OF MESH N/A 07/23/2018   Procedure: INSERTION OF MESH X2;  Surgeon: [name], MD;  Location: [facility];  Service: General;  Laterality: N/A;   IR LUMBAR DISC ASPIRATION W/IMG GUIDE  07/01/2019   KNEE ARTHROSCOPY Left 1990's   KNEE ARTHROSCOPY W/ MENISCECTOMY Left 09/14/2009   and chondroplasty debridement   LAPAROSCOPIC LYSIS OF ADHESIONS  11/17/2015   Procedure: LAPAROSCOPIC LYSIS OF ADHESIONS;  Surgeon: [name], MD;  Location: [facility];  Service: General;;   LUMBAR WOUND DEBRIDEMENT N/A 04/24/2018   Procedure: wound exploration, irrigation and debridement;  Surgeon: Tia Alert, MD;  Location: [facility];  Service: Neurosurgery;  Laterality: N/A;  wound exploration, irrigation and debridement   LYSIS OF ADHESION N/A 07/23/2018   Procedure:  LYSIS OF ADHESIONS;  Surgeon: [name], MD;  Location: [facility];  Service: General;  Laterality: N/A;   PORT-A-CATH REMOVAL N/A 11/21/2016   Procedure: REMOVAL PORT-A-CATH;  Surgeon: [name], MD;  Location: [facility] ;  Service: General;  Laterality: N/A;   PORTACATH PLACEMENT N/A 05/25/2015   Procedure: INSERTION PORT-A-CATH;  Surgeon: [name], MD;  Location: [facility];  Service: General;  Laterality: N/A;-remains inplace Right chest.   ROTATOR CUFF REPAIR Right 2006   TONSILLECTOMY  age 71   VENTRAL HERNIA REPAIR N/A 07/23/2018   Procedure: LAPAROSCOPIC VENTRAL WALL HERNIA REPAIR;  Surgeon: [name], MD;  Location: [facility];  Service: General;  Laterality: N/A;   XI ROBOTIC ASSISTED LOWER ANTERIOR RESECTION N/A 05/25/2015   Procedure: XI ROBOTIC ASSISTED LOWER ANTERIOR RESECTION, , RIGID PROCTOSCOPY, RIGHT OOPHORECTOMY;  Surgeon: [name], MD;  Location: [facility];  Service: General;  Laterality: N/A;   Social History:  reports that she quit smoking about 31 years ago. Her smoking use included cigarettes. She started smoking about 38 years ago. She has a 1.8 pack-year smoking history. She has never used smokeless tobacco. She reports that she does not drink alcohol and does not use drugs.  Allergies  Allergen Reactions   Caine-1 [Lidocaine] Swelling and Rash    Eyes swell shut; includes all caine drugs except marcaine. EMLA cream OK though    Bupropion Other (See Comments)    Not effective per Pt.    Sulfa Antibiotics Nausea And Vomiting and Rash   Pantoprazole Other (See Comments)    Not effective per Pt.    Adhesive [Tape] Rash and Other (See Comments)    Blisters - can use paper tape   Doxycycline Nausea Only   Flagyl [Metronidazole] Nausea Only   Iron Nausea Only   Metoprolol Nausea Only and Palpitations   Oxycodone Other (See Comments)    NIGHTMARES. (tolerates hydrocodone or tramadol better)   Penicillins Nausea Only and Rash    Family History  Problem Relation Age of Onset   Coronary artery disease Mother 88   Hypertension Mother    Hyperlipidemia Mother    Diabetes Mellitus I Mother    Coronary artery disease Father    Hyperlipidemia Father    Hypertension Father    Cancer Sister        skin - non melanoma   Hyperlipidemia Brother    Cancer Maternal Uncle 53       pancreatic with mets to colon and prostate   Cirrhosis Maternal Uncle    Hypertension Maternal Grandmother    Diabetes Mellitus I Maternal Grandmother    Hyperlipidemia Maternal Grandmother    CVA Maternal Grandmother    Hypertension Maternal Grandfather    Coronary artery disease Maternal Grandfather 53   Hyperlipidemia Maternal Grandfather    Coronary artery disease Paternal Grandmother    Hypertension Paternal Grandmother    Hyperlipidemia Paternal Grandmother    Diabetes Mellitus I Paternal Grandmother    Hypertension  Paternal Grandfather    Hyperlipidemia Paternal Grandfather    Coronary artery disease Paternal Grandfather     Prior to Admission medications   Medication Sig Start Date End Date Taking? Authorizing Provider  Buprenorphine HCl (BELBUCA) 750 MCG FILM Place 1 Film inside cheek every 12 (twelve) hours. 03/05/23 05/04/23 Yes [provider]  DULoxetine (CYMBALTA) 60 MG capsule Take 120 mg by mouth every evening.   Yes [provider]  esomeprazole (NEXIUM) 40 MG capsule Take 1 capsule (40  mg total) by mouth 2 (two) times daily before a meal. Patient taking differently: Take 40 mg by mouth daily. May take an additional 40 mg as needed for reflux 12/22/15  Yes [name], MD  furosemide (LASIX) 20 MG tablet Take 20 mg by mouth daily as needed for edema.    Yes [provider]  HYDROcodone-acetaminophen (NORCO) 7.5-325 MG tablet Take 1 tablet by mouth 2 (two) times daily.   Yes [provider]  hyoscyamine (LEVSIN) 0.125 MG tablet Take 0.125 mg by mouth 3 (three) times daily as needed for cramping. 01/28/23  Yes [provider]  levETIRAcetam (KEPPRA) 250 MG tablet Take 500 mg by mouth 2 (two) times daily.   Yes [provider]  levothyroxine (SYNTHROID) 50 MCG tablet Take 50 mcg by mouth every morning. 02/07/22  Yes [provider]  Melatonin 3 MG TABS Take 3-6 mg by mouth at bedtime as needed (sleep).   Yes [provider]  Multiple Vitamin (MULTIVITAMIN WITH MINERALS) TABS tablet Take 2 tablets by mouth at bedtime.    Yes [provider]  naloxone (NARCAN) nasal spray 4 mg/0.1 mL Place 0.4 mg into the nose once. 12/18/22  Yes [provider]  oxybutynin (DITROPAN XL) 15 MG 24 hr tablet Take 15 mg by mouth daily.   Yes [provider]  polyethylene glycol (MIRALAX / GLYCOLAX) 17 g packet Take 17 g by mouth 2 (two) times daily.   Yes [provider]  pregabalin (LYRICA) 225 MG capsule Take 225 mg  by mouth 2 (two) times daily. 02/07/22  Yes [provider]  promethazine (PHENERGAN) 25 MG tablet TAKE ONE TABLET BY MOUTH EVERY 6 HOURS AS NEEDED FOR NAUSEA OR VOMITING Patient taking differently: Take 25 mg by mouth as needed for nausea or vomiting. 07/04/20  Yes Daiva Eves, Lisette Grinder, MD  rosuvastatin (CRESTOR) 5 MG tablet Take 5 mg by mouth at bedtime.   Yes [provider]  acetaminophen (TYLENOL) 500 MG tablet Take 1,000 mg by mouth as needed for moderate pain. Patient not taking: Reported on 04/07/2023    [provider]  bisoprolol (ZEBETA) 5 MG tablet Take 5 mg by mouth daily.    [provider]  Cholecalciferol (VITAMIN D3) 5000 units CAPS Take 5,000 Units by mouth at bedtime.  Patient not taking: Reported on 04/07/2023    [provider]  Coenzyme Q10 (COQ10) 200 MG CAPS Take 200 mg by mouth at bedtime.    [provider]  HYDROcodone-acetaminophen (NORCO/VICODIN) 5-325 MG tablet Take 2 tablets by mouth every 4 (four) hours as needed. Patient not taking: Reported on 04/07/2023 02/23/23   Maryanna Shape A, PA-C  triamcinolone lotion (KENALOG) 0.1 % Apply 1 application topically as needed (dry skin).    [provider]    Physical Exam: Vitals:   04/07/23 0319 04/07/23 0515 04/07/23 0615  BP: (!) 148/97 (!) 165/98 (!) 156/101  Pulse: (!) 116 (!) 106 (!) 102  Resp: 18 18   Temp: 98.6 F (37 C) 98.6 F (37 C)   TempSrc: Oral Oral   SpO2: 96% 96% 95%   Physical Exam Vitals and nursing note reviewed.  Constitutional:      General: She is awake. She is not in acute distress.    Appearance: She is well-developed. She is obese. She is ill-appearing.  HENT:     Head: Normocephalic.     Nose: No rhinorrhea.     Mouth/Throat:     Mouth:  Mucous membranes are moist.  Eyes:     General: No scleral icterus.    Pupils: Pupils are equal, round, and reactive to light.  Neck:     Vascular: No JVD.  Cardiovascular:     Rate and  Rhythm: Normal rate and regular rhythm.     Heart sounds: S1 normal and S2 normal.  Pulmonary:     Effort: Pulmonary effort is normal.     Breath sounds: Normal breath sounds. No wheezing, rhonchi or rales.  Abdominal:     General: Abdomen is protuberant. Bowel sounds are normal. There is no distension.     Palpations: Abdomen is soft.     Tenderness: There is abdominal tenderness in the epigastric area and periumbilical area. There is no right CVA tenderness, left CVA tenderness, guarding or rebound.  Musculoskeletal:     Cervical back: Neck supple.     Right lower leg: No edema.     Left lower leg: No edema.  Skin:    General: Skin is warm and dry.  Neurological:     General: No focal deficit present.     Mental Status: She is alert and oriented to person, place, and time.  Psychiatric:        Mood and Affect: Mood normal.        Behavior: Behavior normal. Behavior is cooperative.    Data Reviewed:  Results are pending, will review when available.  Assessment and Plan: Principal Problem:   SBO (small bowel obstruction) (HCC) Inpatient/MedSurg. Keep NPO. Continue NTG at LIS. Continue IV fluids. Analgesics as needed. Antiemetics as needed. Pantoprazole 40 mg IVP every 24 hours. Keep electrolytes optimized. Follow-up CBC and CMP in AM. Follow-up imaging in the morning. General surgery input appreciated.  Active Problems:   Essential hypertension As needed hydralazine.    GERD (gastroesophageal reflux disease) On PPI as above.    Acquired hypothyroidism Switch to IV levothyroxine tomorrow if unable to tolerate p.o.    Hyperglycemia Check fasting glucose in AM.    Peripheral neuropathy due to chemotherapy (HCC) Resume pregabalin once clear for a lengthy period    Hyperlipidemia Hold rosuvastatin for now.    Seizure disorder (HCC) On parenteral Keppra. Which to p.o. once cleared for oral intake.    Advance Care Planning:   Code Status: Full Code    Consults:   Family Communication:   Severity of Illness: The appropriate patient status for this patient is INPATIENT. Inpatient status is judged to be reasonable and necessary in order to provide the required intensity of service to ensure the patient's safety. The patient's presenting symptoms, physical exam findings, and initial radiographic and laboratory data in the context of their chronic comorbidities is felt to place them at high risk for further clinical deterioration. Furthermore, it is not anticipated that the patient will be medically stable for discharge from the hospital within 2 midnights of admission.   * I certify that at the point of admission it is my clinical judgment that the patient will require inpatient hospital care spanning beyond 2 midnights from the point of admission due to high intensity of service, high risk for further deterioration and high frequency of surveillance required.*  Author: Bobette Mo, MD 04/07/2023 8:00 AM  For on call review [email or web address].   This document was prepared using Dragon voice recognition software and may contain some unintended transcription errors.

## 2023-04-07 NOTE — ED Notes (Signed)
ED TO INPATIENT HANDOFF REPORT  Name/Age/Gender Lindsay Little 57 y.o. female  Code Status    Code Status Orders  (From admission, onward)           Start     Ordered   04/07/23 0556  Full code  Continuous       Question:  By:  Answer:  Consent: discussion documented in EHR   04/07/23 0555           Code Status History     Date Active Date Inactive Code Status Order ID Comments User Context   02/15/2023 0846 02/18/2023 1550 Full Code [id number]  Maryln Gottron, MD ED   10/27/2022 0758 10/31/2022 1624 Full Code [id number]  Maryln Gottron, MD Inpatient   07/28/2022 0049 08/03/2022 1559 Full Code [id number]  Howerter, Chaney Born, DO Inpatient   07/15/2022 0832 07/20/2022 1804 Full Code [id number]  Bobette Mo, MD ED   02/15/2022 1429 02/18/2022 2022 Full Code [id number]  Teddy Spike, DO Inpatient   10/13/2020 0020 10/17/2020 1725 Full Code [id number]  Bobette Mo, MD Inpatient   07/15/2019 0816 07/21/2019 2107 Full Code [id number]  Clydie Braun, MD ED   07/23/2018 1506 07/26/2018 1324 Full Code [id number]  [name], MD Inpatient   04/24/2018 1057 04/24/2018 2056 Full Code [id number]  Tia Alert, MD Inpatient   02/12/2018 1314 02/13/2018 2126 Full Code [id number]  Tia Alert, MD Inpatient   07/10/2016 1848 07/17/2016 1638 Full Code [id number]  [name], MD Inpatient   07/10/2016 1522 07/10/2016 1812 Full Code [id number]  [name], MD Outpatient   05/31/2016 1138 06/03/2016 1834 Full Code [id number]  [name], MD Inpatient   12/20/2015 0727 12/22/2015 1208 Full Code [id number]  [name], MD Inpatient   12/19/2015 2006 12/20/2015 0727 Full Code [id number]  Glenna Fellows, MD Inpatient   12/05/2015 1514 12/07/2015 1935 Full Code [id number]  Adam Phenix, PA-C ED   09/15/2015 1720 09/17/2015 1510 Full Code [id number]  [name], MD Inpatient   06/23/2015 1611 06/29/2015 1355 Full Code [id number]  [name], MD Inpatient   05/25/2015 1609 05/29/2015 1313 Full  Code [id number]  [name], MD Inpatient       Home/SNF/Other Home  Chief Complaint SBO (small bowel obstruction) (HCC) [K56.609]  Level of Care/Admitting Diagnosis ED Disposition     ED Disposition  Admit   Condition  --   Comment  Hospital Area: [facility] [[id number]]  Level of Care: Med-Surg [16]  May admit patient to [facility] or [facility] if equivalent level of care is available:: No  Covid Evaluation: Asymptomatic - no recent exposure (last 10 days) testing not required  Diagnosis: SBO (small bowel obstruction) [facility]) [[id number]]  Admitting Physician: Angie Fava [[id number]]  Attending Physician: Angie Fava [[id number]]  Certification:: I certify this patient will need inpatient services for at least 2 midnights  Expected Medical Readiness: 04/09/2023          Medical History Past Medical History:  Diagnosis Date   Anxiety    Chemotherapy-induced neuropathy (HCC)    toes and fingers numbness and tingling   Colorectal delayed anastomotic leak s/p resection & colostomy 07/12/2016 11/17/2015   Colostomy in place [facility]) 07/12/2016   due to anastomosis breakdown w/ colovaginal fistula   Colovaginal fistula s/p omentopexy repair 07/12/2016 07/10/2016   Complication of anesthesia    Depression    Family history of adverse reaction to anesthesia    mother--  ponv   Family history of breast cancer    Family history of pancreatic cancer    Family history of skin cancer    Gastroenteritis 12/20/2015   GERD (gastroesophageal reflux disease)    Hardware complicating wound infection (HCC) 08/03/2019   Hiatal hernia    History of cancer chemotherapy 02-20-2015 to 03-30-2015   History of cardiac murmur as a child    History of chemotherapy    History of chronic gastritis    History of hypertension    no issues since multiple abdminal sx's and chemo--- no medication since 12/ 2016   History of TMJ disorder    Hypercholesteremia    Hypertension     IBS (irritable bowel syndrome)    dx age 33   Intermittent abdominal pain    post-op multiple abdominal sx's    Microcytic anemia    Mild sleep apnea    per pt study 2014  very mild osa , no cpap recommended, recommeded wt loss and sleep routine   OA (osteoarthritis)    left knee /  left shoulder   Obesity    Parastomal hernia s/p lap repair w mesh 07/23/2018 07/23/2018   Pelvic abscess s/p drainage & omental pedicle flap 11/17/2015 06/23/2015   PONV (postoperative nausea and vomiting)    severe" needs Scopolamine PATCH"    Portacath in place    right chest   Rectal adenocarcinoma (HCC) oncologis-  dr Mosetta Putt-- after radiation/ chemo (ypT1, N0) --  no recurrence per last note 03/ 2018   dx 01-13-2015-- Stage IIIC (T3, N2, M0) post concurrent radiation and chemotherapy 02-20-2015 to 03-30-2015 /  05-25-2015 s/p  LAR w/ RSO (post-op complicated by late abscess and contained anatomotic leak w/ help percutaneous drainage and antibiotics)    Rectocutaneous fistula 07/10/2016   Rotator cuff tear, left    S/P radiation therapy 02/20/15-03/30/15   colon/rectal   Vitamin D deficiency    Wears glasses    Wears glasses     Allergies Allergies  Allergen Reactions   Caine-1 [Lidocaine] Swelling and Rash    Eyes swell shut; includes all caine drugs except marcaine. EMLA cream OK though    Bupropion Other (See Comments)    Not effective per Pt.    Sulfa Antibiotics Nausea And Vomiting and Rash   Pantoprazole Other (See Comments)    Not effective per Pt.    Adhesive [Tape] Rash and Other (See Comments)    Blisters - can use paper tape   Doxycycline Nausea Only   Flagyl [Metronidazole] Nausea Only   Iron Nausea Only   Metoprolol Nausea Only and Palpitations   Oxycodone Other (See Comments)    NIGHTMARES. (tolerates hydrocodone or tramadol better)   Penicillins Nausea Only and Rash    IV Location/Drains/Wounds Patient Lines/Drains/Airways Status     Active Line/Drains/Airways     Name  Placement date Placement time Site Days   Peripheral IV 04/07/23 20 G 1" Left Antecubital 04/07/23  0400  Antecubital  less than 1   Colostomy LUQ 07/12/16  1732  LUQ  2460   Ureteral Drain/Stent Right ureter 6 Fr. 02/16/23  0951  Right ureter  50   Ureteral Drain/Stent Left ureter 6 Fr. 02/16/23  0953  Left ureter  50            Labs/Imaging Results for orders placed or performed during the hospital encounter of 04/07/23 (from the past 48 hour(s))  CBC with Differential     Status: Abnormal  Collection Time: 04/07/23  4:10 AM  Result Value Ref Range   WBC 11.3 (H) 4.0 - 10.5 K/uL   RBC 5.46 (H) 3.87 - 5.11 MIL/uL   Hemoglobin 13.8 12.0 - 15.0 g/dL   HCT 81.1 91.4 - 78.2 %   MCV 82.4 80.0 - 100.0 fL   MCH 25.3 (L) 26.0 - 34.0 pg   MCHC 30.7 30.0 - 36.0 g/dL   RDW 95.6 21.3 - 08.6 %   Platelets 477 (H) 150 - 400 K/uL   nRBC 0.0 0.0 - 0.2 %   Neutrophils Relative % 83 %   Neutro Abs 9.3 (H) 1.7 - 7.7 K/uL   Lymphocytes Relative 12 %   Lymphs Abs 1.4 0.7 - 4.0 K/uL   Monocytes Relative 4 %   Monocytes Absolute 0.4 0.1 - 1.0 K/uL   Eosinophils Relative 1 %   Eosinophils Absolute 0.1 0.0 - 0.5 K/uL   Basophils Relative 0 %   Basophils Absolute 0.0 0.0 - 0.1 K/uL   Immature Granulocytes 0 %   Abs Immature Granulocytes 0.05 0.00 - 0.07 K/uL    Comment: Performed at [facility], [address], [place], [place] [address]  Comprehensive metabolic panel     Status: Abnormal   Collection Time: 04/07/23  4:10 AM  Result Value Ref Range   Sodium 137 135 - 145 mmol/L   Potassium 3.6 3.5 - 5.1 mmol/L   Chloride 101 98 - 111 mmol/L   CO2 24 22 - 32 mmol/L   Glucose, Bld 154 (H) 70 - 99 mg/dL    Comment: Glucose reference range applies only to samples taken after fasting for at least 8 hours.   BUN 14 6 - 20 mg/dL   Creatinine, Ser 9.62 0.44 - 1.00 mg/dL   Calcium 95.2 8.9 - 84.1 mg/dL   Total Protein 9.3 (H) 6.5 - 8.1 g/dL   Albumin 3.8 3.5 - 5.0 g/dL   AST  17 15 - 41 U/L   ALT 16 0 - 44 U/L   Alkaline Phosphatase 73 38 - 126 U/L   Total Bilirubin 0.5 0.3 - 1.2 mg/dL   GFR, Estimated >32 >44 mL/min    Comment: (NOTE) Calculated using the CKD-EPI Creatinine Equation (2021)    Anion gap 12 5 - 15    Comment: Performed at [facility], [address] [place], [place] [address]  Lipase, blood     Status: None   Collection Time: 04/07/23  4:10 AM  Result Value Ref Range   Lipase 20 11 - 51 U/L    Comment: Performed at [facility], [address], [place], [place] [address]  Urinalysis, Routine w reflex microscopic -Urine, Clean Catch     Status: Abnormal   Collection Time: 04/07/23  8:28 AM  Result Value Ref Range   Color, Urine YELLOW YELLOW   APPearance CLEAR CLEAR   Specific Gravity, Urine >1.046 (H) 1.005 - 1.030   pH 8.0 5.0 - 8.0   Glucose, UA NEGATIVE NEGATIVE mg/dL   Hgb urine dipstick MODERATE (A) NEGATIVE   Bilirubin Urine NEGATIVE NEGATIVE   Ketones, ur NEGATIVE NEGATIVE mg/dL   Protein, ur 440 (A) NEGATIVE mg/dL   Nitrite NEGATIVE NEGATIVE   Leukocytes,Ua LARGE (A) NEGATIVE   RBC / HPF >50 0 - 5 RBC/hpf   WBC, UA >50 0 - 5 WBC/hpf   Bacteria, UA NONE SEEN NONE SEEN   Squamous Epithelial / HPF 0-5 0 - 5 /HPF  Mucus PRESENT     Comment: Performed at [facility], [address], [place], [place] [address]   CT ABDOMEN PELVIS W CONTRAST  Result Date: 04/07/2023 CLINICAL DATA:  Evaluate for bowel obstruction. Complains of vomiting, upper abdominal pain and diarrhea. History of rectal cancer, status post low anterior resection. * Tracking Code: BO * EXAM: CT ABDOMEN AND PELVIS WITH CONTRAST TECHNIQUE: Multidetector CT imaging of the abdomen and pelvis was performed using the standard protocol following bolus administration of intravenous contrast. RADIATION DOSE REDUCTION: This exam was performed according to the departmental dose-optimization program which includes  automated exposure control, adjustment of the mA and/or kV according to patient size and/or use of iterative reconstruction technique. CONTRAST:  OMNIPAQUE IOHEXOL 300 MG/ML  SOLN COMPARISON:  02/23/2023 FINDINGS: Lower chest: No acute abnormality. Hepatobiliary: No focal liver abnormality is seen. No gallstones, gallbladder wall thickening, or biliary dilatation. Pancreas: Unremarkable. No pancreatic ductal dilatation or surrounding inflammatory changes. Spleen: Normal in size without focal abnormality. Adrenals/Urinary Tract: Normal adrenal glands. Mild bilateral renal cortical scarring. Bilateral nephroureteral stents are in place. Interval improvement in right-sided hydronephrosis. Scratch there is mild right-sided hydronephrosis, improved from previous exam. Stable left pelvocaliectasis. No focal bladder abnormality. Stomach/Bowel: Postoperative changes from low anterior resection with left abdominal colostomy. Fluid distension of the stomach. Increase caliber of the proximal small bowel loops with air-fluid levels. Small bowel loops now measure up to 4.9 cm, image 48/2. Transition to decreased caliber distal small bowel is identified within the lower abdomen, image 69/2. The colon is normal in caliber of to the colostomy site. Vascular/Lymphatic: Aortic atherosclerosis. Mild right lower quadrant mesenteric adenopathy. Index lymph node measures 1 cm, image 67/2. Previously this measured the same. No additional adenopathy identified. Reproductive: Status post hysterectomy. No adnexal masses. Other: Post treatment changes identified within the presacral fat region. Increased soft tissue in this area measures 9.1 x 5.8 cm, image 83/2. This appears unchanged in size from previous exam. Central focus of fluid and gas within this area measures 4.6 x 1.7 cm, image 85/2. Previously 5.9 x 2.8 cm previously. Musculoskeletal: Postoperative changes identified within the lower lumbar spine. No acute or suspicious  osseous bone lesions. IMPRESSION: 1. Postoperative changes from low anterior resection with left abdominal colostomy. 2. Increase caliber of the proximal small bowel loops with air-fluid levels. Transition to decreased caliber distal small bowel is identified within the lower abdomen. Findings are consistent with small bowel obstruction. 3. Post treatment changes identified within the presacral fat region. Within this area, there is a fluid and gas collection which has decreased in size from previous exam. 4. Bilateral nephroureteral stents are in place. Interval improvement in right-sided hydronephrosis. 5. Aortic Atherosclerosis (ICD10-I70.0). Electronically Signed   By: Signa Kell M.D.   On: 04/07/2023 05:31    Pending Labs Unresulted Labs (From admission, onward)    None       Vitals/Pain Today's Vitals   04/07/23 0515 04/07/23 0552 04/07/23 0615 04/07/23 0821  BP: (!) 165/98  (!) 156/101 139/80  Pulse: (!) 106  (!) 102 (!) 117  Resp: 18   16  Temp: 98.6 F (37 C)     TempSrc: Oral     SpO2: 96%  95% 94%  PainSc: 9  5       Isolation Precautions No active isolations  Medications Medications  acetaminophen (TYLENOL) tablet 650 mg (has no administration in time range)    Or  acetaminophen (TYLENOL) suppository 650 mg (has no  administration in time range)  naloxone [facility]) injection 0.4 mg (has no administration in time range)  HYDROmorphone (DILAUDID) injection 0.5 mg (has no administration in time range)  ondansetron (ZOFRAN) injection 4 mg (has no administration in time range)  lactated ringers infusion ( Intravenous New Bag/Given 04/07/23 0641)  ondansetron (ZOFRAN) injection 4 mg (4 mg Intravenous Given 04/07/23 0414)  famotidine (PEPCID) IVPB 20 mg premix (0 mg Intravenous Stopped 04/07/23 0537)  sodium chloride 0.9 % bolus 1,000 mL (0 mLs Intravenous Stopped 04/07/23 0537)  iohexol (OMNIPAQUE) 300 MG/ML solution 100 mL (100 mLs Intravenous Contrast Given 04/07/23 0448)   HYDROmorphone (DILAUDID) injection 1 mg (1 mg Intravenous Given 04/07/23 0533)  lactated ringers bolus 1,000 mL (0 mLs Intravenous Stopped 04/07/23 0817)  metoCLOPramide (REGLAN) injection 10 mg (10 mg Intravenous Given 04/07/23 0642)    Mobility walks

## 2023-04-07 NOTE — ED Notes (Signed)
Iv difficulty

## 2023-04-07 NOTE — ED Provider Notes (Signed)
[place] EMERGENCY DEPARTMENT AT [facility] Provider Note   CSN: [id number] Arrival date & time: 04/07/23  0305     History  Chief Complaint  Patient presents with   Abdominal Pain    Lindsay Little is a 57 y.o. female.  57 year old female with a history of hyperlipidemia, hypertension, IBS, chronic colostomy presents to the emergency department for evaluation of nausea and vomiting.  She reports onset of vomiting at 1300 today.  Notes too numerous to count episodes of emesis.  No blood streaked in her vomit, melena, hematochezia.  She has noted some decreased output from her ostomy today.  Reports that symptoms feel similar to prior episodes of bowel obstruction.  She does endorse some pain across her upper abdomen.  The history is provided by the patient. No language interpreter was used.  Abdominal Pain      Home Medications Prior to Admission medications   Medication Sig Start Date End Date Taking? Authorizing Provider  Buprenorphine HCl (BELBUCA) 750 MCG FILM Place 1 Film inside cheek every 12 (twelve) hours. 03/05/23 05/04/23 Yes [provider]  DULoxetine (CYMBALTA) 60 MG capsule Take 120 mg by mouth every evening.   Yes [provider]  esomeprazole (NEXIUM) 40 MG capsule Take 1 capsule (40 mg total) by mouth 2 (two) times daily before a meal. Patient taking differently: Take 40 mg by mouth daily. May take an additional 40 mg as needed for reflux 12/22/15  Yes [name], MD  furosemide (LASIX) 20 MG tablet Take 20 mg by mouth daily as needed for edema.    Yes [provider]  HYDROcodone-acetaminophen (NORCO) 7.5-325 MG tablet Take 1 tablet by mouth 2 (two) times daily.   Yes [provider]  hyoscyamine (LEVSIN) 0.125 MG tablet Take 0.125 mg by mouth 3 (three) times daily as needed for cramping. 01/28/23  Yes [provider]  levETIRAcetam (KEPPRA) 250 MG tablet Take 500 mg by mouth 2 (two) times daily.   Yes  [provider]  levothyroxine (SYNTHROID) 50 MCG tablet Take 50 mcg by mouth every morning. 02/07/22  Yes [provider]  Melatonin 3 MG TABS Take 3-6 mg by mouth at bedtime as needed (sleep).   Yes [provider]  Multiple Vitamin (MULTIVITAMIN WITH MINERALS) TABS tablet Take 2 tablets by mouth at bedtime.    Yes [provider]  naloxone (NARCAN) nasal spray 4 mg/0.1 mL Place 0.4 mg into the nose once. 12/18/22  Yes [provider]  oxybutynin (DITROPAN XL) 15 MG 24 hr tablet Take 15 mg by mouth daily.   Yes [provider]  polyethylene glycol (MIRALAX / GLYCOLAX) 17 g packet Take 17 g by mouth 2 (two) times daily.   Yes [provider]  pregabalin (LYRICA) 225 MG capsule Take 225 mg by mouth 2 (two) times daily. 02/07/22  Yes [provider]  promethazine (PHENERGAN) 25 MG tablet TAKE ONE TABLET BY MOUTH EVERY 6 HOURS AS NEEDED FOR NAUSEA OR VOMITING Patient taking differently: Take 25 mg by mouth as needed for nausea or vomiting. 07/04/20  Yes Daiva Eves, Lisette Grinder, MD  rosuvastatin (CRESTOR) 5 MG tablet Take 5 mg by mouth at bedtime.   Yes [provider]  acetaminophen (TYLENOL) 500 MG tablet Take 1,000 mg by mouth as needed for moderate pain. Patient not taking: Reported on 04/07/2023    [provider]  bisoprolol (ZEBETA) 5 MG tablet Take 5 mg by mouth daily.  [provider]  Cholecalciferol (VITAMIN D3) 5000 units CAPS Take 5,000 Units by mouth at bedtime.  Patient not taking: Reported on 04/07/2023    [provider]  Coenzyme Q10 (COQ10) 200 MG CAPS Take 200 mg by mouth at bedtime.    [provider]  HYDROcodone-acetaminophen (NORCO/VICODIN) 5-325 MG tablet Take 2 tablets by mouth every 4 (four) hours as needed. Patient not taking: Reported on 04/07/2023 02/23/23   Maryanna Shape A, PA-C  triamcinolone lotion (KENALOG) 0.1 % Apply 1 application topically as needed (dry  skin).    [provider]      Allergies    Caine-1 [lidocaine], Bupropion, Sulfa antibiotics, Pantoprazole, Adhesive [tape], Doxycycline, Flagyl [metronidazole], Iron, Metoprolol, Oxycodone, and Penicillins    Review of Systems   Review of Systems  Gastrointestinal:  Positive for abdominal pain.  Ten systems reviewed and are negative for acute change, except as noted in the HPI.    Physical Exam Updated Vital Signs BP (!) 165/98   Pulse (!) 106   Temp 98.6 F (37 C) (Oral)   Resp 18   SpO2 96%   Physical Exam Vitals and nursing note reviewed.  Constitutional:      General: She is not in acute distress.    Appearance: She is well-developed. She is not diaphoretic.     Comments: Nontoxic-appearing and in no distress.  Does appear ill, nauseated.  HENT:     Head: Normocephalic and atraumatic.  Eyes:     General: No scleral icterus.    Extraocular Movements: EOM normal.     Conjunctiva/sclera: Conjunctivae normal.  Cardiovascular:     Rate and Rhythm: Regular rhythm. Tachycardia present.     Pulses: Normal pulses.  Pulmonary:     Effort: Pulmonary effort is normal. No respiratory distress.     Comments: Respirations even and unlabored Abdominal:     Tenderness: There is abdominal tenderness.     Comments: Tenderness across the upper abdomen.  Abdomen is soft, obese.  Exam is limited secondary to habitus.  No peritoneal signs.  Small amount of brown stool in ostomy bag.  Musculoskeletal:        General: Normal range of motion.     Cervical back: Normal range of motion.  Skin:    General: Skin is warm and dry.     Coloration: Skin is not pale.     Findings: No erythema or rash.  Neurological:     Mental Status: She is alert and oriented to person, place, and time.     Coordination: Coordination normal.  Psychiatric:        Mood and Affect: Mood and affect normal.        Behavior: Behavior normal.     ED Results / Procedures / Treatments   Labs (all labs  ordered are listed, but only abnormal results are displayed) Labs Reviewed  CBC WITH DIFFERENTIAL/PLATELET - Abnormal; Notable for the following components:      Result Value   WBC 11.3 (*)    RBC 5.46 (*)    MCH 25.3 (*)    Platelets 477 (*)    Neutro Abs 9.3 (*)    All other components within normal limits  COMPREHENSIVE METABOLIC PANEL - Abnormal; Notable for the following components:   Glucose, Bld 154 (*)    Total Protein 9.3 (*)    All other components within normal limits  LIPASE, BLOOD  URINALYSIS, ROUTINE W REFLEX MICROSCOPIC    EKG None  Radiology  CT ABDOMEN PELVIS W CONTRAST  Result Date: 04/07/2023 CLINICAL DATA:  Evaluate for bowel obstruction. Complains of vomiting, upper abdominal pain and diarrhea. History of rectal cancer, status post low anterior resection. * Tracking Code: BO * EXAM: CT ABDOMEN AND PELVIS WITH CONTRAST TECHNIQUE: Multidetector CT imaging of the abdomen and pelvis was performed using the standard protocol following bolus administration of intravenous contrast. RADIATION DOSE REDUCTION: This exam was performed according to the departmental dose-optimization program which includes automated exposure control, adjustment of the mA and/or kV according to patient size and/or use of iterative reconstruction technique. CONTRAST:  OMNIPAQUE IOHEXOL 300 MG/ML  SOLN COMPARISON:  02/23/2023 FINDINGS: Lower chest: No acute abnormality. Hepatobiliary: No focal liver abnormality is seen. No gallstones, gallbladder wall thickening, or biliary dilatation. Pancreas: Unremarkable. No pancreatic ductal dilatation or surrounding inflammatory changes. Spleen: Normal in size without focal abnormality. Adrenals/Urinary Tract: Normal adrenal glands. Mild bilateral renal cortical scarring. Bilateral nephroureteral stents are in place. Interval improvement in right-sided hydronephrosis. Scratch there is mild right-sided hydronephrosis, improved from previous exam. Stable left  pelvocaliectasis. No focal bladder abnormality. Stomach/Bowel: Postoperative changes from low anterior resection with left abdominal colostomy. Fluid distension of the stomach. Increase caliber of the proximal small bowel loops with air-fluid levels. Small bowel loops now measure up to 4.9 cm, image 48/2. Transition to decreased caliber distal small bowel is identified within the lower abdomen, image 69/2. The colon is normal in caliber of to the colostomy site. Vascular/Lymphatic: Aortic atherosclerosis. Mild right lower quadrant mesenteric adenopathy. Index lymph node measures 1 cm, image 67/2. Previously this measured the same. No additional adenopathy identified. Reproductive: Status post hysterectomy. No adnexal masses. Other: Post treatment changes identified within the presacral fat region. Increased soft tissue in this area measures 9.1 x 5.8 cm, image 83/2. This appears unchanged in size from previous exam. Central focus of fluid and gas within this area measures 4.6 x 1.7 cm, image 85/2. Previously 5.9 x 2.8 cm previously. Musculoskeletal: Postoperative changes identified within the lower lumbar spine. No acute or suspicious osseous bone lesions. IMPRESSION: 1. Postoperative changes from low anterior resection with left abdominal colostomy. 2. Increase caliber of the proximal small bowel loops with air-fluid levels. Transition to decreased caliber distal small bowel is identified within the lower abdomen. Findings are consistent with small bowel obstruction. 3. Post treatment changes identified within the presacral fat region. Within this area, there is a fluid and gas collection which has decreased in size from previous exam. 4. Bilateral nephroureteral stents are in place. Interval improvement in right-sided hydronephrosis. 5. Aortic Atherosclerosis (ICD10-I70.0). Electronically Signed   By: Signa Kell M.D.   On: 04/07/2023 05:31    Procedures Procedures    Medications Ordered in  ED Medications  acetaminophen (TYLENOL) tablet 650 mg (has no administration in time range)    Or  acetaminophen (TYLENOL) suppository 650 mg (has no administration in time range)  naloxone (NARCAN) injection 0.4 mg (has no administration in time range)  HYDROmorphone (DILAUDID) injection 0.5 mg (has no administration in time range)  ondansetron (ZOFRAN) injection 4 mg (has no administration in time range)  lactated ringers infusion (has no administration in time range)  metoCLOPramide (REGLAN) injection 10 mg (has no administration in time range)  ondansetron (ZOFRAN) injection 4 mg (4 mg Intravenous Given 04/07/23 0414)  famotidine (PEPCID) IVPB 20 mg premix (0 mg Intravenous Stopped 04/07/23 0537)  sodium chloride 0.9 % bolus 1,000 mL (0 mLs Intravenous Stopped 04/07/23 0537)  iohexol (OMNIPAQUE) 300  MG/ML solution 100 mL (100 mLs Intravenous Contrast Given 04/07/23 0448)  HYDROmorphone (DILAUDID) injection 1 mg (1 mg Intravenous Given 04/07/23 0533)  lactated ringers bolus 1,000 mL (1,000 mLs Intravenous New Bag/Given 04/07/23 0552)    ED Course/ Medical Decision Making/ A&P Clinical Course as of 04/07/23 5956  [facility] Apr 07, 2023  0510 Air-fluid levels visualized on CT concerning for partial or complete small bowel obstruction.  Pending formal radiology interpretation. [KH]  [phone] Radiology read confirms small bowel obstruction.  Order placed for NG tube.  Plan for admission. [KH]  0607 Patient reports that she can only tolerate NG tube placement under sedation.  She typically goes without an NG tube during an admission and symptoms improved with bowel rest and nausea management. [KH]    Clinical Course User Index [KH] Antony Madura, PA-C                                 Medical Decision Making Amount and/or Complexity of Data Reviewed Labs: ordered. Radiology: ordered.  Risk Prescription drug management. Decision regarding hospitalization.   This patient presents to the ED for  concern of N/V and abdominal pain, this involves an extensive number of treatment options, and is a complaint that carries with it a high risk of complications and morbidity.  The differential diagnosis includes pSBO/SBO vs ruptured viscous vs viral illness vs biliary colic   Co morbidities that complicate the patient evaluation  HTN HLD IBS   Additional history obtained:  External records from outside source obtained and reviewed including prior abdominal CTs, last notable for SBO in January 2024   Lab Tests:  I Ordered, and personally interpreted labs.  The pertinent results include:  WBC 11.3, Glucose 154   Imaging Studies ordered:  I ordered imaging studies including CT abdomen/pelvis  I independently visualized and interpreted imaging which showed findings c/w SBO I agree with the radiologist interpretation   Cardiac Monitoring:  The patient was maintained on a cardiac monitor.  I personally viewed and interpreted the cardiac monitored which showed an underlying rhythm of: sinus tachycardia   Medicines ordered and prescription drug management:  I ordered medication including Dilaudid for pain and Zofran, Reglan for nausea  Reevaluation of the patient after these medicines showed that the patient improved I have reviewed the patients home medicines and have made adjustments as needed   Test Considered:  Lactic acid   Consultations Obtained:  I requested consultation with the hospitalist and discussed lab and imaging findings as well as pertinent plan - they will assess patient in the ED for admission.   Problem List / ED Course:  As above   Reevaluation:  After the interventions noted above, I reevaluated the patient and found that they have :stayed the same   Social Determinants of Health:  Chronic ostomy   Dispostion:  After consideration of the diagnostic results and the patients response to treatment, I feel that the patent would benefit from  admission for management of SBO. Dr. Arlean Hopping of [facility] to admit.          Final Clinical Impression(s) / ED Diagnoses Final diagnoses:  Small bowel obstruction [facility])    Rx / DC Orders ED Discharge Orders     None         Antony Madura, PA-C 04/07/23 6433    Gilda Crease, MD 04/07/23 [phone]

## 2023-04-07 NOTE — Consult Note (Signed)
Lindsay Little 1966-05-25  [id number].    Requesting MD: Dr. Sanda Klein Chief Complaint/Reason for Consult: SBO  HPI: Lindsay Little is a 57 yo white female with a history of colon cancer s/p robotic LAR/DLI and takedown followed by a leak and colovaginal fistula ultimately resulting in an APR type anatomy with an end colostomy.  She then had a sugarbaker style parastomal hernia repair with a 35x27cm piece of meshed placed all by Dr. Michaell Cowing.  She has subsequently had chemotherapy and radiation therapy to her pelvis.  She is followed by Dr. Mosetta Putt for her hx of Cancer. She was admitted in April of 2022 with a psbo and August 2024 that resolved on it's own with no surgical intervention.     Patient reports she began having  abdominal pain with associated n/v yesterday around 1pm. She has had decreased ostomy output since symptom onset and as this felt similar to her prior episodes of SBO she presented to the ED for evaluation.   Currently she is tachycardic in the 110's, afebrile and without hypotension. WBC 11.3. No AKI. CT with increase caliber of the proximal small bowel loops with air-fluid levels and a transition to decreased caliber distal small bowel within the lower abdomen concerning for SBO. She was admitted to [facility] and we were asked to see. Now having more ostomy output and nauseated but has not vomited since being here.   ROS:  As above, see hpi  Family History  Problem Relation Age of Onset   Coronary artery disease Mother 16   Hypertension Mother    Hyperlipidemia Mother    Diabetes Mellitus I Mother    Coronary artery disease Father    Hyperlipidemia Father    Hypertension Father    Cancer Sister        skin - non melanoma   Hyperlipidemia Brother    Cancer Maternal Uncle 40       pancreatic with mets to colon and prostate   Cirrhosis Maternal Uncle    Hypertension Maternal Grandmother    Diabetes Mellitus I Maternal Grandmother    Hyperlipidemia Maternal  Grandmother    CVA Maternal Grandmother    Hypertension Maternal Grandfather    Coronary artery disease Maternal Grandfather 55   Hyperlipidemia Maternal Grandfather    Coronary artery disease Paternal Grandmother    Hypertension Paternal Grandmother    Hyperlipidemia Paternal Grandmother    Diabetes Mellitus I Paternal Grandmother    Hypertension Paternal Grandfather    Hyperlipidemia Paternal Grandfather    Coronary artery disease Paternal Grandfather     Past Medical History:  Diagnosis Date   Anxiety    Chemotherapy-induced neuropathy (HCC)    toes and fingers numbness and tingling   Colorectal delayed anastomotic leak s/p resection & colostomy 07/12/2016 11/17/2015   Colostomy in place [facility]) 07/12/2016   due to anastomosis breakdown w/ colovaginal fistula   Colovaginal fistula s/p omentopexy repair 07/12/2016 07/10/2016   Complication of anesthesia    Depression    Family history of adverse reaction to anesthesia    mother-- ponv   Family history of breast cancer    Family history of pancreatic cancer    Family history of skin cancer    Gastroenteritis 12/20/2015   GERD (gastroesophageal reflux disease)    Hardware complicating wound infection (HCC) 08/03/2019   Hiatal hernia    History of cancer chemotherapy 02-20-2015 to 03-30-2015   History of cardiac murmur as a child  History of chemotherapy    History of chronic gastritis    History of hypertension    no issues since multiple abdminal sx's and chemo--- no medication since 12/ 2016   History of TMJ disorder    Hypercholesteremia    Hypertension    IBS (irritable bowel syndrome)    dx age 49   Intermittent abdominal pain    post-op multiple abdominal sx's    Microcytic anemia    Mild sleep apnea    per pt study 2014  very mild osa , no cpap recommended, recommeded wt loss and sleep routine   OA (osteoarthritis)    left knee /  left shoulder   Obesity    Parastomal hernia s/p lap repair w mesh 07/23/2018  07/23/2018   Pelvic abscess s/p drainage & omental pedicle flap 11/17/2015 06/23/2015   PONV (postoperative nausea and vomiting)    severe" needs Scopolamine PATCH"    Portacath in place    right chest   Rectal adenocarcinoma (HCC) oncologis-  dr Mosetta Putt-- after radiation/ chemo (ypT1, N0) --  no recurrence per last note 03/ 2018   dx 01-13-2015-- Stage IIIC (T3, N2, M0) post concurrent radiation and chemotherapy 02-20-2015 to 03-30-2015 /  05-25-2015 s/p  LAR w/ RSO (post-op complicated by late abscess and contained anatomotic leak w/ help percutaneous drainage and antibiotics)    Rectocutaneous fistula 07/10/2016   Rotator cuff tear, left    S/P radiation therapy 02/20/15-03/30/15   colon/rectal   Vitamin D deficiency    Wears glasses    Wears glasses     Past Surgical History:  Procedure Laterality Date   ABDOMINAL HYSTERECTOMY  1996   uterus and cervix   BACK SURGERY  02/12/2018   lumbar surgery   COLON RESECTION N/A 07/12/2016   Procedure: LAPAROSCOPIC LYSIS OF ADHESIONS, OMENTOPEXY, HAND ASSISTED RESECTION OF  COLON, END TO END ANASTOMOSIS, COLOSTOMY;  Surgeon: [name], MD;  Location: [facility];  Service: General;  Laterality: N/A;   COMBINED HYSTEROSCOPY DIAGNOSTIC / D&C  x2 1990's   CYSTOSCOPY W/ URETERAL STENT PLACEMENT Bilateral 02/16/2023   Procedure: CYSTOSCOPY WITH RETROGRADE PYELOGRAM BILATERAL STENT EXCHANGE;  Surgeon: Joline Maxcy, MD;  Location: [facility];  Service: Urology;  Laterality: Bilateral;   DIAGNOSTIC LAPAROSCOPY  age 40 and age 82   EUS N/A 01/18/2015   Procedure: LOWER ENDOSCOPIC ULTRASOUND (EUS);  Surgeon: Willis Modena, MD;  Location: [facility];  Service: Endoscopy;  Laterality: N/A;   EXCISION OF SKIN TAG  11/17/2015   Procedure: EXCISION OF SKIN TAG;  Surgeon: [name], MD;  Location: [facility];  Service: General;;   ILEO LOOP COLOSTOMY CLOSURE N/A 11/17/2015   Procedure: LAPAROSCOPIC DIVERTING LOOP ILEOSTOMY  DRAINAGE OF PELVIC ABSCESS;  Surgeon: [name],  MD;  Location: [facility];  Service: General;  Laterality: N/A;   ILEOSTOMY CLOSURE N/A 05/31/2016   Procedure: TAKEDOWN LOOP ILEOSTOMY;  Surgeon: [name], MD;  Location: [facility];  Service: General;  Laterality: N/A;   IMPACTION REMOVAL  11/17/2015   Procedure: DISIMPACTION REMOVAL;  Surgeon: [name], MD;  Location: [facility];  Service: General;;   INSERTION OF MESH N/A 07/23/2018   Procedure: INSERTION OF MESH X2;  Surgeon: [name], MD;  Location: [facility];  Service: General;  Laterality: N/A;   IR LUMBAR DISC ASPIRATION W/IMG GUIDE  07/01/2019   KNEE ARTHROSCOPY Left 1990's   KNEE ARTHROSCOPY W/ MENISCECTOMY Left 09/14/2009   and chondroplasty debridement   LAPAROSCOPIC LYSIS OF ADHESIONS  11/17/2015   Procedure: LAPAROSCOPIC LYSIS OF ADHESIONS;  Surgeon: [name], MD;  Location: [facility];  Service: General;;   LUMBAR WOUND DEBRIDEMENT N/A 04/24/2018   Procedure: wound exploration, irrigation and debridement;  Surgeon: Tia Alert, MD;  Location: [facility];  Service: Neurosurgery;  Laterality: N/A;  wound exploration, irrigation and debridement   LYSIS OF ADHESION N/A 07/23/2018   Procedure: LYSIS OF ADHESIONS;  Surgeon: [name], MD;  Location: [facility];  Service: General;  Laterality: N/A;   PORT-A-CATH REMOVAL N/A 11/21/2016   Procedure: REMOVAL PORT-A-CATH;  Surgeon: [name], MD;  Location: [facility];  Service: General;  Laterality: N/A;   PORTACATH PLACEMENT N/A 05/25/2015   Procedure: INSERTION PORT-A-CATH;  Surgeon: [name], MD;  Location: [facility];  Service: General;  Laterality: N/A;-remains inplace Right chest.   ROTATOR CUFF REPAIR Right 2006   TONSILLECTOMY  age 67   VENTRAL HERNIA REPAIR N/A 07/23/2018   Procedure: LAPAROSCOPIC VENTRAL WALL HERNIA REPAIR;  Surgeon: [name], MD;  Location: [facility];  Service: General;  Laterality: N/A;   XI ROBOTIC ASSISTED LOWER ANTERIOR RESECTION N/A 05/25/2015   Procedure: XI ROBOTIC ASSISTED LOWER ANTERIOR  RESECTION, , RIGID PROCTOSCOPY, RIGHT OOPHORECTOMY;  Surgeon: [name], MD;  Location: [facility];  Service: General;  Laterality: N/A;    Social History:  reports that she quit smoking about 31 years ago. Her smoking use included cigarettes. She started smoking about 38 years ago. She has a 1.8 pack-year smoking history. She has never used smokeless tobacco. She reports that she does not drink alcohol and does not use drugs.  Allergies:  Allergies  Allergen Reactions   Caine-1 [Lidocaine] Swelling and Rash    Eyes swell shut; includes all caine drugs except marcaine. EMLA cream OK though    Bupropion Other (See Comments)    Not effective per Pt.    Sulfa Antibiotics Nausea And Vomiting and Rash   Pantoprazole Other (See Comments)    Not effective per Pt.    Adhesive [Tape] Rash and Other (See Comments)    Blisters - can use paper tape   Doxycycline Nausea Only   Flagyl [Metronidazole] Nausea Only   Iron Nausea Only   Metoprolol Nausea Only and Palpitations   Oxycodone Other (See Comments)    NIGHTMARES. (tolerates hydrocodone or tramadol better)   Penicillins Nausea Only and Rash    Medications Prior to Admission  Medication Sig Dispense Refill   Buprenorphine HCl (BELBUCA) 750 MCG FILM Place 1 Film inside cheek every 12 (twelve) hours.     DULoxetine (CYMBALTA) 60 MG capsule Take 120 mg by mouth every evening.     esomeprazole (NEXIUM) 40 MG capsule Take 1 capsule (40 mg total) by mouth 2 (two) times daily before a meal. (Patient taking differently: Take 40 mg by mouth daily. May take an additional 40 mg as needed for reflux) 60 capsule 5   furosemide (LASIX) 20 MG tablet Take 20 mg by mouth daily as needed for edema.      HYDROcodone-acetaminophen (NORCO) 7.5-325 MG tablet Take 1 tablet by mouth 2 (two) times daily.     hyoscyamine (LEVSIN) 0.125 MG tablet Take 0.125 mg by mouth 3 (three) times daily as needed for cramping.     levETIRAcetam (KEPPRA) 250 MG tablet Take 500 mg by  mouth 2 (two) times daily.     levothyroxine (SYNTHROID) 50 MCG tablet Take 50 mcg by mouth every morning.     Melatonin 3 MG  TABS Take 3-6 mg by mouth at bedtime as needed (sleep).     Multiple Vitamin (MULTIVITAMIN WITH MINERALS) TABS tablet Take 2 tablets by mouth at bedtime.      naloxone (NARCAN) nasal spray 4 mg/0.1 mL Place 0.4 mg into the nose once.     oxybutynin (DITROPAN XL) 15 MG 24 hr tablet Take 15 mg by mouth daily.     polyethylene glycol (MIRALAX / GLYCOLAX) 17 g packet Take 17 g by mouth 2 (two) times daily.     pregabalin (LYRICA) 225 MG capsule Take 225 mg by mouth 2 (two) times daily.     promethazine (PHENERGAN) 25 MG tablet TAKE ONE TABLET BY MOUTH EVERY 6 HOURS AS NEEDED FOR NAUSEA OR VOMITING (Patient taking differently: Take 25 mg by mouth as needed for nausea or vomiting.) 60 tablet 0   rosuvastatin (CRESTOR) 5 MG tablet Take 5 mg by mouth at bedtime.     acetaminophen (TYLENOL) 500 MG tablet Take 1,000 mg by mouth as needed for moderate pain. (Patient not taking: Reported on 04/07/2023)     bisoprolol (ZEBETA) 5 MG tablet Take 5 mg by mouth daily.     Cholecalciferol (VITAMIN D3) 5000 units CAPS Take 5,000 Units by mouth at bedtime.  (Patient not taking: Reported on 04/07/2023)     Coenzyme Q10 (COQ10) 200 MG CAPS Take 200 mg by mouth at bedtime.     HYDROcodone-acetaminophen (NORCO/VICODIN) 5-325 MG tablet Take 2 tablets by mouth every 4 (four) hours as needed. (Patient not taking: Reported on 04/07/2023) 10 tablet 0   triamcinolone lotion (KENALOG) 0.1 % Apply 1 application topically as needed (dry skin).       Physical Exam: Blood pressure (!) 150/99, pulse (!) 111, temperature 98.7 F (37.1 C), temperature source Oral, resp. rate 18, SpO2 98%. General: pleasant, WD/WN female who is laying in bed in NAD HEENT: head is normocephalic, atraumatic.   Heart: regular, rate, and rhythm.  Lungs: CTAB, no wheezes, rhonchi, or rales noted.  Respiratory effort  nonlabored Abd:  Soft, mild to moderate distension with generalized ttp without rigidity or guarding. Hypoactive BS. No masses, hernias, or organomegaly. Stoma viable. Ostomy bag with soft brown stool in bag. Prior abdominal scars well healed.  MS: no BUE or BLE edema Skin: warm and dry  Psych: A&Ox4 with an appropriate affect Neuro: cranial nerves grossly intact, normal speech, thought process intact, moves all extremities, gait not assessed  Results for orders placed or performed during the hospital encounter of 04/07/23 (from the past 48 hour(s))  CBC with Differential     Status: Abnormal   Collection Time: 04/07/23  4:10 AM  Result Value Ref Range   WBC 11.3 (H) 4.0 - 10.5 K/uL   RBC 5.46 (H) 3.87 - 5.11 MIL/uL   Hemoglobin 13.8 12.0 - 15.0 g/dL   HCT 16.1 09.6 - 04.5 %   MCV 82.4 80.0 - 100.0 fL   MCH 25.3 (L) 26.0 - 34.0 pg   MCHC 30.7 30.0 - 36.0 g/dL   RDW 40.9 81.1 - 91.4 %   Platelets 477 (H) 150 - 400 K/uL   nRBC 0.0 0.0 - 0.2 %   Neutrophils Relative % 83 %   Neutro Abs 9.3 (H) 1.7 - 7.7 K/uL   Lymphocytes Relative 12 %   Lymphs Abs 1.4 0.7 - 4.0 K/uL   Monocytes Relative 4 %   Monocytes Absolute 0.4 0.1 - 1.0 K/uL   Eosinophils Relative 1 %   Eosinophils  Absolute 0.1 0.0 - 0.5 K/uL   Basophils Relative 0 %   Basophils Absolute 0.0 0.0 - 0.1 K/uL   Immature Granulocytes 0 %   Abs Immature Granulocytes 0.05 0.00 - 0.07 K/uL    Comment: Performed at [facility], [address], [place], [place] [address]  Comprehensive metabolic panel     Status: Abnormal   Collection Time: 04/07/23  4:10 AM  Result Value Ref Range   Sodium 137 135 - 145 mmol/L   Potassium 3.6 3.5 - 5.1 mmol/L   Chloride 101 98 - 111 mmol/L   CO2 24 22 - 32 mmol/L   Glucose, Bld 154 (H) 70 - 99 mg/dL    Comment: Glucose reference range applies only to samples taken after fasting for at least 8 hours.   BUN 14 6 - 20 mg/dL   Creatinine, Ser 2.13 0.44 - 1.00 mg/dL   Calcium  08.6 8.9 - 57.8 mg/dL   Total Protein 9.3 (H) 6.5 - 8.1 g/dL   Albumin 3.8 3.5 - 5.0 g/dL   AST 17 15 - 41 U/L   ALT 16 0 - 44 U/L   Alkaline Phosphatase 73 38 - 126 U/L   Total Bilirubin 0.5 0.3 - 1.2 mg/dL   GFR, Estimated >46 >96 mL/min    Comment: (NOTE) Calculated using the CKD-EPI Creatinine Equation (2021)    Anion gap 12 5 - 15    Comment: Performed at [facility], [address] [place], [place] [address]  Lipase, blood     Status: None   Collection Time: 04/07/23  4:10 AM  Result Value Ref Range   Lipase 20 11 - 51 U/L    Comment: Performed at [facility], [address] [place], [place] [address]  Magnesium     Status: None   Collection Time: 04/07/23  4:10 AM  Result Value Ref Range   Magnesium 1.9 1.7 - 2.4 mg/dL    Comment: Performed at [facility], [address], [place], [place] [address]  Urinalysis, Routine w reflex microscopic -Urine, Clean Catch     Status: Abnormal   Collection Time: 04/07/23  8:28 AM  Result Value Ref Range   Color, Urine YELLOW YELLOW   APPearance CLEAR CLEAR   Specific Gravity, Urine >1.046 (H) 1.005 - 1.030   pH 8.0 5.0 - 8.0   Glucose, UA NEGATIVE NEGATIVE mg/dL   Hgb urine dipstick MODERATE (A) NEGATIVE   Bilirubin Urine NEGATIVE NEGATIVE   Ketones, ur NEGATIVE NEGATIVE mg/dL   Protein, ur 725 (A) NEGATIVE mg/dL   Nitrite NEGATIVE NEGATIVE   Leukocytes,Ua LARGE (A) NEGATIVE   RBC / HPF >50 0 - 5 RBC/hpf   WBC, UA >50 0 - 5 WBC/hpf   Bacteria, UA NONE SEEN NONE SEEN   Squamous Epithelial / HPF 0-5 0 - 5 /HPF   Mucus PRESENT     Comment: Performed at [facility], [address] [place], [place] [address]   CT ABDOMEN PELVIS W CONTRAST  Result Date: 04/07/2023 CLINICAL DATA:  Evaluate for bowel obstruction. Complains of vomiting, upper abdominal pain and diarrhea. History of rectal cancer, status post low anterior resection. * Tracking Code: BO *  EXAM: CT ABDOMEN AND PELVIS WITH CONTRAST TECHNIQUE: Multidetector CT imaging of the abdomen and pelvis was performed using the standard protocol following bolus administration of intravenous contrast. RADIATION DOSE REDUCTION: This exam was performed according to the departmental dose-optimization program which  includes automated exposure control, adjustment of the mA and/or kV according to patient size and/or use of iterative reconstruction technique. CONTRAST:  OMNIPAQUE IOHEXOL 300 MG/ML  SOLN COMPARISON:  02/23/2023 FINDINGS: Lower chest: No acute abnormality. Hepatobiliary: No focal liver abnormality is seen. No gallstones, gallbladder wall thickening, or biliary dilatation. Pancreas: Unremarkable. No pancreatic ductal dilatation or surrounding inflammatory changes. Spleen: Normal in size without focal abnormality. Adrenals/Urinary Tract: Normal adrenal glands. Mild bilateral renal cortical scarring. Bilateral nephroureteral stents are in place. Interval improvement in right-sided hydronephrosis. Scratch there is mild right-sided hydronephrosis, improved from previous exam. Stable left pelvocaliectasis. No focal bladder abnormality. Stomach/Bowel: Postoperative changes from low anterior resection with left abdominal colostomy. Fluid distension of the stomach. Increase caliber of the proximal small bowel loops with air-fluid levels. Small bowel loops now measure up to 4.9 cm, image 48/2. Transition to decreased caliber distal small bowel is identified within the lower abdomen, image 69/2. The colon is normal in caliber of to the colostomy site. Vascular/Lymphatic: Aortic atherosclerosis. Mild right lower quadrant mesenteric adenopathy. Index lymph node measures 1 cm, image 67/2. Previously this measured the same. No additional adenopathy identified. Reproductive: Status post hysterectomy. No adnexal masses. Other: Post treatment changes identified within the presacral fat region. Increased soft tissue in  this area measures 9.1 x 5.8 cm, image 83/2. This appears unchanged in size from previous exam. Central focus of fluid and gas within this area measures 4.6 x 1.7 cm, image 85/2. Previously 5.9 x 2.8 cm previously. Musculoskeletal: Postoperative changes identified within the lower lumbar spine. No acute or suspicious osseous bone lesions. IMPRESSION: 1. Postoperative changes from low anterior resection with left abdominal colostomy. 2. Increase caliber of the proximal small bowel loops with air-fluid levels. Transition to decreased caliber distal small bowel is identified within the lower abdomen. Findings are consistent with small bowel obstruction. 3. Post treatment changes identified within the presacral fat region. Within this area, there is a fluid and gas collection which has decreased in size from previous exam. 4. Bilateral nephroureteral stents are in place. Interval improvement in right-sided hydronephrosis. 5. Aortic Atherosclerosis (ICD10-I70.0). Electronically Signed   By: Signa Kell M.D.   On: 04/07/2023 05:31    Anti-infectives (From admission, onward)    None       Assessment/Plan SBO - CT w/ transition within the lower abdomen concerning for SBO. Complex surgical hx as stated in HPI. Hx of SBO in 10/2020 and 02/2023 that resolved with conservative management.  - HDS without fever, tachycardia or hypotension. No peritonitis on exam. WBC 11.3. No current indication for emergency surgery - KUB with dilated small bowel but patient not vomiting and would like to hold off on NGT also now having increased ostomy output - ok to have ice chips and sips of liquids but if she develops increased nausea or vomiting would recommend NGT placement and SBO protocol - Keep K > 4, Mg > 2 and mobilize as able for bowel function - Hopefully patient will improve with conservative management. If patient fails to improve with conservative management, they may require exploratory surgery during  admission - Agree with medical admission. We will follow with you.  FEN - ice chips/sips, IVF per TRH VTE - SCDs, okay for chem ppx from a general surgery standpoint ID - None  I reviewed ED provider notes, hospitalist notes, last 24 h vitals and pain scores, last 48 h intake and output, last 24 h labs and trends, and last 24 h imaging results  Trixie Deis, [facility] 04/07/2023, 11:47 AM Please see Amion for pager number during day hours 7:00am-4:30pm

## 2023-04-07 NOTE — ED Triage Notes (Signed)
Ems reports pt has been vomiting, diarrhea  with UQ abdominal pain. 96 % room air  167 CBBG 168/60  Hx colostomy due to rectal cancer.

## 2023-04-07 NOTE — ED Notes (Signed)
Night shift reported pt adamantly refused the NGT unless she was sedated.

## 2023-04-07 NOTE — Progress Notes (Signed)
Carryover admission to the Day Admitter.  I discussed this case with the EDP, Dr. Antony Madura, PA.  Per these discussions:   This is a 57 year old female with inflammatory bowel disorder status post partial colectomy with diverting colostomy, who is being admitted with small bowel striction after presenting with 1 day of abdominal discomfort, nausea, vomiting, and diminished output via her colostomy.  Ensuing CT scan she has evidence of small bowel obstruction.  NGT has been ordered.  I have placed an order for inpatient admission to MedSurg for further evaluation management of the above.  I have placed some additional preliminary admit orders via the adult multi-morbid admission order set. I have also ordered prn IV Dilaudid, prn IV Zofran, as well as continuous lactated Ringer's running at 100 cc/h.    Newton Pigg, DO Hospitalist

## 2023-04-08 ENCOUNTER — Inpatient Hospital Stay (HOSPITAL_COMMUNITY): Payer: Medicare HMO

## 2023-04-08 DIAGNOSIS — G620 Drug-induced polyneuropathy: Secondary | ICD-10-CM

## 2023-04-08 DIAGNOSIS — I10 Essential (primary) hypertension: Secondary | ICD-10-CM

## 2023-04-08 DIAGNOSIS — E782 Mixed hyperlipidemia: Secondary | ICD-10-CM

## 2023-04-08 DIAGNOSIS — G40909 Epilepsy, unspecified, not intractable, without status epilepticus: Secondary | ICD-10-CM

## 2023-04-08 DIAGNOSIS — E039 Hypothyroidism, unspecified: Secondary | ICD-10-CM | POA: Diagnosis not present

## 2023-04-08 DIAGNOSIS — T451X5A Adverse effect of antineoplastic and immunosuppressive drugs, initial encounter: Secondary | ICD-10-CM

## 2023-04-08 DIAGNOSIS — R739 Hyperglycemia, unspecified: Secondary | ICD-10-CM

## 2023-04-08 DIAGNOSIS — K219 Gastro-esophageal reflux disease without esophagitis: Secondary | ICD-10-CM

## 2023-04-08 DIAGNOSIS — K56609 Unspecified intestinal obstruction, unspecified as to partial versus complete obstruction: Secondary | ICD-10-CM | POA: Diagnosis not present

## 2023-04-08 LAB — CBC WITH DIFFERENTIAL/PLATELET
Abs Immature Granulocytes: 0.04 10*3/uL (ref 0.00–0.07)
Basophils Absolute: 0 10*3/uL (ref 0.0–0.1)
Basophils Relative: 1 %
Eosinophils Absolute: 0.2 10*3/uL (ref 0.0–0.5)
Eosinophils Relative: 2 %
HCT: 34.2 % — ABNORMAL LOW (ref 36.0–46.0)
Hemoglobin: 10.3 g/dL — ABNORMAL LOW (ref 12.0–15.0)
Immature Granulocytes: 1 %
Lymphocytes Relative: 23 %
Lymphs Abs: 1.8 10*3/uL (ref 0.7–4.0)
MCH: 25.6 pg — ABNORMAL LOW (ref 26.0–34.0)
MCHC: 30.1 g/dL (ref 30.0–36.0)
MCV: 84.9 fL (ref 80.0–100.0)
Monocytes Absolute: 0.5 10*3/uL (ref 0.1–1.0)
Monocytes Relative: 6 %
Neutro Abs: 5 10*3/uL (ref 1.7–7.7)
Neutrophils Relative %: 67 %
Platelets: 287 10*3/uL (ref 150–400)
RBC: 4.03 MIL/uL (ref 3.87–5.11)
RDW: 15.3 % (ref 11.5–15.5)
WBC: 7.5 10*3/uL (ref 4.0–10.5)
nRBC: 0 % (ref 0.0–0.2)

## 2023-04-08 LAB — COMPREHENSIVE METABOLIC PANEL
ALT: 12 U/L (ref 0–44)
AST: 19 U/L (ref 15–41)
Albumin: 3.1 g/dL — ABNORMAL LOW (ref 3.5–5.0)
Alkaline Phosphatase: 56 U/L (ref 38–126)
Anion gap: 7 (ref 5–15)
BUN: 7 mg/dL (ref 6–20)
CO2: 23 mmol/L (ref 22–32)
Calcium: 8.6 mg/dL — ABNORMAL LOW (ref 8.9–10.3)
Chloride: 108 mmol/L (ref 98–111)
Creatinine, Ser: 0.55 mg/dL (ref 0.44–1.00)
GFR, Estimated: >60 mL/min (ref >60)
Glucose, Bld: 117 mg/dL — ABNORMAL HIGH (ref 70–99)
Potassium: 4.3 mmol/L (ref 3.5–5.1)
Sodium: 138 mmol/L (ref 135–145)
Total Bilirubin: 0.8 mg/dL (ref 0.3–1.2)
Total Protein: 7.2 g/dL (ref 6.5–8.1)

## 2023-04-08 LAB — PHOSPHORUS: Phosphorus: 2 mg/dL — ABNORMAL LOW (ref 2.5–4.6)

## 2023-04-08 LAB — MAGNESIUM: Magnesium: 2 mg/dL (ref 1.7–2.4)

## 2023-04-08 MED ORDER — BUPRENORPHINE HCL 750 MCG BU FILM: 1.0000 | ORAL_FILM | Freq: Two times a day (BID) | BUCCAL | Status: DC

## 2023-04-08 MED ORDER — POLYETHYLENE GLYCOL 3350 17 G PO PACK
17.0000 g | PACK | Freq: Every day | ORAL | Status: DC
  Administered 2023-04-08 – 2023-04-10 (×3): 17 g via ORAL
  Filled 2023-04-08 (×3): qty 1

## 2023-04-08 NOTE — Progress Notes (Signed)
PROGRESS NOTE    Lindsay Little  URK:[id number] DOB: July 11, 1966 DOA: 04/07/2023 PCP: Laurann Montana, MD   Brief Narrative:  The patient is a 57 year old morbidly obese Caucasian female with past medical history significant for but limited to anxiety, depression, chemotherapy induced neuropathy, inflammatory bowel disease which is now status post colostomy, GERD, gastroenteritis, hiatal hernia, history of rectal cancer with status post surgery and diverting colostomy and other comorbidities who presented with abdominal pain and discomfort as well as associated nausea vomiting and diarrhea with decreased colostomy output but no melanotic stools.  This is the fourth time this has happened IBS and she came to the ED and CT scan abdomen pelvis was done and was suspicious for small bowel obstruction as the patient had an increased caliber of the proximal small bowel loops with air-fluid levels with a transition to decrease the caliber small bowel identified within the lower abdomen.  Notably her right-sided hydronephrosis had improved and in the ED she was given 2 L boluses, IV mag sulfate, metoclopramide, hydromorphone and IV famotidine.  NG tube was never placed and patient was able to have some pain control but repeat KUB done today showed persistence.  Despite this the general surgery team feels that her diet can be advanced and she has been placed on a clear liquid diet with advancing to full ambulatory today.  Will have repeat blood work this afternoon.  Assessment and Plan:  SBO (small bowel obstruction) (HCC) -Inpatient/MedSurg. -CT scan done and showed "Postoperative changes from low anterior resection with left abdominal colostomy. Increase caliber of the proximal small bowel loops with air-fluid levels. Transition to decreased caliber distal small bowel is identified within the lower abdomen. Findings are consistent with small bowel obstruction. Post treatment changes identified within the  presacral fat region. Within this area, there is a fluid and gas collection which has decreased in size from previous exam. Bilateral nephroureteral stents are in place. Interval improvement in right-sided hydronephrosis. Aortic Atherosclerosis" -Kept NPO but now general surgery recommending the patient is okay to have clear liquid diet as tolerated advance the diet to full liquid diet -Does not have an NG tube to low intermittent suction now -General Surgery consulted and recommending keeping and optimizing electrolytes with a potassium of greater than 4 and magnesium greater than 2 and mobilizing as well -KUB done and showed "Persistent gaseous small bowel distension centrally, up to 5 cm, consistent with small bowel obstruction."  -IVF with NS + 20 mEQ Kcl at 125 mL/hr now stopped; received 2 L fluid boluses -C/w buprenorphine 1 film buccal every 12 hours, hydromorphone 0.5 mg every 2 hours as needed severe pain with Narcan and continue with antiemetics with ondansetron 4 mg IV every 6 as needed for nausea vomiting as well as prochlorperazine 10 mg IV every 6 as needed for refractory nausea vomiting -Appreciate general surgery evaluation and they are hoping this will improve with conservative management however if she fails to improve with conservative management they feel the patient may require exploratory surgery during this admission -Appreciate further care and from General Surgery   Essential Hypertension -C/w Hydralazine 10 mg q4hprn SBP >159 -Currently holding Bisoprolol 5 mg po Daily  -Continue to Monitor BP per Protocol -Last BP reading was 138/74   Acquired Hypothyroidism -Switched to IV levothyroxine on admission given NPO status but no on a CLD per Surgery and once able to tolerate Soft Diet will change back to po -Check TSH in the AM   Hyperglycemia Check  fasting glucose in AM. -Continue to Monitor CBGs and Glucose Trend No results for input(s): "GLUCAP" in the last 720  hours. Recent Labs  Lab 04/07/23 0410  GLUCOSE 154*  -Place on Sensitive Novolog SSI AC/HS if Necessary   Peripheral Neuropathy due to chemotherapy (HCC) -Resume Pregabalin 225 mg po BID once tolerating a better diet as well as Duloxetine   Chronic Pain Syndrome -Resume Home Buprenophrine 750 mcg 1 film Buccal q12h -Resume rest of home narcotics when able to tolerate diet better   Hyperlipidemia -Hold Rosuvastatin 5 mg po at bedtime or now until able to tolerate    Seizure Disorder (HCC) -On IV Levetiracetam 500 mg q12h for now and when tolerating a Soft Diet will switch to po. -C/w Seizure Precautions  Thrombocytosis -Likely Reactive -Platelet Count Trend: Recent Labs  Lab 04/07/23 0410  PLT 477*  -Continue to Monitor and Trend and Repeat pending this Afternoon  GERD (gastroesophageal reflux disease)/GI Prophylaxis -On Famotidine IV 20 mg q12h given that Pantoprazole is listed as an allergy; Can Take Esomeprazole but currently just got started on po  Morbid Obesity -Complicates overall prognosis and care -Estimated body mass index is 40.87 kg/m as calculated from the following:   Height as of 02/14/23: 5\' 11"  (1.803 m).   Weight as of 02/14/23: 132.9 kg.  -Weight Loss and Dietary Counseling given   DVT prophylaxis: SCDs Start: 04/07/23 0556    Code Status: Full Code Family Communication: No family, at bedside  Disposition Plan:  Level of care: Med-Surg Status is: Inpatient Remains inpatient appropriate because: His further clinical improvement and tolerance of diet prior to safe discharge disposition and clearance by the general surgery team   Consultants:  General Surgery  Procedures:  As delineated as above  Antimicrobials:  Anti-infectives (From admission, onward)    None       Subjective: Seen and examined at bedside and she is still having some abdominal discomfort but thinks it is improving slowly.  This is the fourth episode she states that she had  dysuria.  No nausea or vomiting.  States that so far she is tolerating liquids.  No lightheadedness or dizziness.  No other concerns or complaints this time.  Objective: Vitals:   04/07/23 0930 04/07/23 1541 04/07/23 2127 04/08/23 0742  BP: (!) 150/99 (!) 164/91 130/82 138/74  Pulse: (!) 111 90 72 60  Resp: 18 14 19 18   Temp: 98.7 F (37.1 C) 99 F (37.2 C)    TempSrc: Oral Oral    SpO2: 98% 96% 95% 98%    Intake/Output Summary (Last 24 hours) at 04/08/2023 1344 Last data filed at 04/07/2023 1729 Gross per 24 hour  Intake 1709 ml  Output 300 ml  Net 1409 ml   There were no vitals filed for this visit.  Examination: Physical Exam:  Constitutional: WN/WD morbidly obese Caucasian female no acute distress Respiratory: Diminished to auscultation bilaterally, no wheezing, rales, rhonchi or crackles. Normal respiratory effort and patient is not tachypenic. No accessory muscle use.  Cardiovascular: RRR, no murmurs / rubs / gallops. S1 and S2 auscultated. No extremity edema.  Abdomen: Soft, slightly-tender, distended secondary body habitus and has been left-sided ostomy with some air and some soft brown stool. Bowel sounds positive.  GU: Deferred. Musculoskeletal: No clubbing / cyanosis of digits/nails. No joint deformity upper and lower extremities.  Skin: No rashes, lesions, ulcers limited skin evaluation. No induration; Warm and dry.  Neurologic: CN 2-12 grossly intact with no focal deficits. Romberg sign  and cerebellar reflexes not assessed.  Psychiatric: Normal judgment and insight. Alert and oriented x 3. Normal mood and appropriate affect.   Data Reviewed: I have personally reviewed following labs and imaging studies  CBC: Recent Labs  Lab 04/07/23 0410  WBC 11.3*  NEUTROABS 9.3*  HGB 13.8  HCT 45.0  MCV 82.4  PLT 477*   Basic Metabolic Panel: Recent Labs  Lab 04/07/23 0410  NA 137  K 3.6  CL 101  CO2 24  GLUCOSE 154*  BUN 14  CREATININE 0.67  CALCIUM 10.1   MG 1.9   GFR: CrCl cannot be calculated (Unknown ideal weight.). Liver Function Tests: Recent Labs  Lab 04/07/23 0410  AST 17  ALT 16  ALKPHOS 73  BILITOT 0.5  PROT 9.3*  ALBUMIN 3.8   Recent Labs  Lab 04/07/23 0410  LIPASE 20   No results for input(s): "AMMONIA" in the last 168 hours. Coagulation Profile: No results for input(s): "INR", "PROTIME" in the last 168 hours. Cardiac Enzymes: No results for input(s): "CKTOTAL", "CKMB", "CKMBINDEX", "TROPONINI" in the last 168 hours. BNP (last 3 results) No results for input(s): "PROBNP" in the last 8760 hours. HbA1C: No results for input(s): "HGBA1C" in the last 72 hours. CBG: No results for input(s): "GLUCAP" in the last 168 hours. Lipid Profile: No results for input(s): "CHOL", "HDL", "LDLCALC", "TRIG", "CHOLHDL", "LDLDIRECT" in the last 72 hours. Thyroid Function Tests: No results for input(s): "TSH", "T4TOTAL", "FREET4", "T3FREE", "THYROIDAB" in the last 72 hours. Anemia Panel: No results for input(s): "VITAMINB12", "FOLATE", "FERRITIN", "TIBC", "IRON", "RETICCTPCT" in the last 72 hours. Sepsis Labs: No results for input(s): "PROCALCITON", "LATICACIDVEN" in the last 168 hours.  No results found for this or any previous visit (from the past 240 hour(s)).   Radiology Studies: DG Abd Portable 1V  Result Date: 04/08/2023 CLINICAL DATA:  Small bowel obstruction. EXAM: PORTABLE ABDOMEN - 1 VIEW COMPARISON:  Radiographs and CT yesterday FINDINGS: Persistent gaseous small bowel distension centrally, up to 5 cm. Bilateral nephroureteral stents in place. Clearing of retained contrast in the renal collecting systems. IMPRESSION: Persistent gaseous small bowel distension centrally, up to 5 cm, consistent with small bowel obstruction. Electronically Signed   By: Narda Rutherford M.D.   On: 04/08/2023 10:27   DG Abd Portable 1V  Result Date: 04/07/2023 CLINICAL DATA:  Small-bowel obstruction EXAM: PORTABLE ABDOMEN - 1 VIEW  COMPARISON:  10/30/2022; CT abdomen and pelvis-03/18/2023 FINDINGS: There is moderate-to-marked gaseous distention of multiple loops small bowel with index loop of small bowel measuring 4.9 cm in diameter with apparent mild bowel wall thickening. A small amount of distal colonic gas is seen within the ascending colon. Nondiagnostic evaluation for pneumoperitoneum secondary to supine positioning and exclusion of the lower thorax. No definite pneumatosis or portal venous gas Post bilateral ureteral stent placement. Excreted contrast is seen within the bilateral renal collecting system, extending to the level of the urinary bladder. Mild right-sided pelvicaliectasis. Phleboliths overlie the lower pelvis bilaterally. Moderate degenerative change of the bilateral hips with joint space loss, subchondral sclerosis and osteophytosis. IMPRESSION: 1. Findings compatible with known small bowel obstruction with associated small bowel wall thickening as demonstrated on preceding abdominal CT. 2. Post bilateral ureteral stent placement with mild right-sided pelvicaliectasis. Electronically Signed   By: Simonne Come M.D.   On: 04/07/2023 13:31   CT ABDOMEN PELVIS W CONTRAST  Result Date: 04/07/2023 CLINICAL DATA:  Evaluate for bowel obstruction. Complains of vomiting, upper abdominal pain and diarrhea. History of rectal cancer,  status post low anterior resection. * Tracking Code: BO * EXAM: CT ABDOMEN AND PELVIS WITH CONTRAST TECHNIQUE: Multidetector CT imaging of the abdomen and pelvis was performed using the standard protocol following bolus administration of intravenous contrast. RADIATION DOSE REDUCTION: This exam was performed according to the departmental dose-optimization program which includes automated exposure control, adjustment of the mA and/or kV according to patient size and/or use of iterative reconstruction technique. CONTRAST:  OMNIPAQUE IOHEXOL 300 MG/ML  SOLN COMPARISON:  02/23/2023 FINDINGS: Lower  chest: No acute abnormality. Hepatobiliary: No focal liver abnormality is seen. No gallstones, gallbladder wall thickening, or biliary dilatation. Pancreas: Unremarkable. No pancreatic ductal dilatation or surrounding inflammatory changes. Spleen: Normal in size without focal abnormality. Adrenals/Urinary Tract: Normal adrenal glands. Mild bilateral renal cortical scarring. Bilateral nephroureteral stents are in place. Interval improvement in right-sided hydronephrosis. Scratch there is mild right-sided hydronephrosis, improved from previous exam. Stable left pelvocaliectasis. No focal bladder abnormality. Stomach/Bowel: Postoperative changes from low anterior resection with left abdominal colostomy. Fluid distension of the stomach. Increase caliber of the proximal small bowel loops with air-fluid levels. Small bowel loops now measure up to 4.9 cm, image 48/2. Transition to decreased caliber distal small bowel is identified within the lower abdomen, image 69/2. The colon is normal in caliber of to the colostomy site. Vascular/Lymphatic: Aortic atherosclerosis. Mild right lower quadrant mesenteric adenopathy. Index lymph node measures 1 cm, image 67/2. Previously this measured the same. No additional adenopathy identified. Reproductive: Status post hysterectomy. No adnexal masses. Other: Post treatment changes identified within the presacral fat region. Increased soft tissue in this area measures 9.1 x 5.8 cm, image 83/2. This appears unchanged in size from previous exam. Central focus of fluid and gas within this area measures 4.6 x 1.7 cm, image 85/2. Previously 5.9 x 2.8 cm previously. Musculoskeletal: Postoperative changes identified within the lower lumbar spine. No acute or suspicious osseous bone lesions. IMPRESSION: 1. Postoperative changes from low anterior resection with left abdominal colostomy. 2. Increase caliber of the proximal small bowel loops with air-fluid levels. Transition to decreased caliber  distal small bowel is identified within the lower abdomen. Findings are consistent with small bowel obstruction. 3. Post treatment changes identified within the presacral fat region. Within this area, there is a fluid and gas collection which has decreased in size from previous exam. 4. Bilateral nephroureteral stents are in place. Interval improvement in right-sided hydronephrosis. 5. Aortic Atherosclerosis (ICD10-I70.0). Electronically Signed   By: Signa Kell M.D.   On: 04/07/2023 05:31    Scheduled Meds:  Buprenorphine HCl  1 Film Buccal Q12H   polyethylene glycol  17 g Oral Daily   Continuous Infusions:  famotidine (PEPCID) IV 20 mg (04/08/23 0500)   levETIRAcetam 500 mg (04/08/23 0959)    LOS: 1 day   Marguerita Merles, DO [facility] Available via Epic secure chat 7am-7pm After these hours, please refer to coverage provider listed on [email or web address] 04/08/2023, 1:44 PM

## 2023-04-08 NOTE — TOC Initial Note (Signed)
Transition of Care [facility]) - Initial/Assessment Note    Patient Details  Name: Lindsay Little MRN: [id number] Date of Birth: 1965/08/17  [facility] CM/SW Contact:    Harriett Sine, RN Phone [phone]  04/08/2023, 9:38 PM  Clinical Narrative:                  Pt from home, no SDOH needs, TOC following       Patient Goals and CMS Choice            Expected Discharge Plan and Services                                              Prior Living Arrangements/Services                       Activities of Daily Living Home Assistive Devices/Equipment: None ADL Screening (condition at time of admission) Is the patient deaf or have difficulty hearing?: No Does the patient have difficulty seeing, even when wearing glasses/contacts?: No Does the patient have difficulty concentrating, remembering, or making decisions?: No  Permission Sought/Granted                  Emotional Assessment              Admission diagnosis:  Small bowel obstruction (HCC) [K56.609] SBO (small bowel obstruction) (HCC) [K56.609] Patient Active Problem List   Diagnosis Date Noted   AKI (acute kidney injury) (HCC) 02/15/2023   SBO (small bowel obstruction) (HCC) 10/30/2022   Nausea and vomiting 10/27/2022   Bladder spasms 09/16/2022   Gross hematuria 09/16/2022   Hypercalcemia 07/28/2022   Leukocytosis 07/28/2022   Hyperlipidemia 07/28/2022   Seizure disorder (HCC) 07/28/2022   Nausea & vomiting 07/28/2022   Partial small bowel obstruction (HCC) 02/15/2022   Acquired hypothyroidism 02/15/2022   GERD (gastroesophageal reflux disease) 02/15/2022   Hyperglycemia 02/15/2022   Lactic acidosis 02/15/2022   Urinary incontinence, mixed 02/15/2022   Bilateral hydronephrosis 02/15/2022   Overactive bladder 01/07/2022   Mixed incontinence urge and stress 11/29/2021   Nocturia 11/29/2021   Peripheral neuropathy due to chemotherapy (HCC)  10/03/2021   Adverse effect of antineoplastic and immunosuppressive drugs, initial encounter 10/03/2021   Spondylolisthesis, lumbar region 10/03/2021   Hypophosphatemia 10/13/2020   History of small bowel obstruction 10/13/2020   Small bowel obstruction (HCC) 10/12/2020   Hardware complicating wound infection (HCC) 08/03/2019   Discitis, unspecified, lumbar region 07/15/2019   Chronic pain syndrome 07/24/2018   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   Seroma of musculoskeletal structure after non-musculoskeletal system procedure 04/24/2018   S/P lumbar spinal fusion 02/12/2018   Genetic testing 01/12/2018   Family history of pancreatic cancer    Family history of skin cancer    Family history of breast cancer    Colostomy in place (HCC) 07/12/2016   Hydroureter on right 12/20/2015   Abdominal pain 12/19/2015   Hypokalemia 11/23/2015   Anemia of chronic disease 06/14/2015   Essential hypertension    IBS (irritable bowel syndrome)    Depression    Cavernous hemangioma of liver - segments 5 & 7 02/07/2015   Unilateral primary osteoarthritis, left knee 02/07/2015   Crohn's disease (HCC) 02/07/2015   Rectal adenocarcinoma s/p LAR resection 05/25/2015 01/31/2015   Pure hypercholesterolemia 02/21/2014   Obesity, Class III,  BMI 40-49.9 (morbid obesity) (HCC) 02/21/2014   Family history of ischemic heart disease 02/21/2014   PCP:  Laurann Montana, MD Pharmacy:   [facility] [place], [place] - [address] [place] [address] Phone: [phone] Fax: [phone]     Social Determinants of Health (SDOH) Social History: SDOH Screenings   Food Insecurity: No Food Insecurity (04/07/2023)  Housing: High Risk (04/07/2023)  Transportation Needs: No Transportation Needs (04/07/2023)  Utilities: Not At Risk (04/07/2023)  Financial Resource Strain: High Risk (10/04/2022)   Received from [facility]   Physical Activity: Inactive (10/04/2022)   Received from [facility], [facility]  Social Connections: Moderately Integrated (10/04/2022)   Received from [facility], [facility]  Recent Concern: Social Connections - Somewhat Isolated (08/27/2022)   Received from [facility]  Stress: Stress Concern Present (10/04/2022)   Received from [facility], [facility]  Tobacco Use: Medium Risk (04/07/2023)   SDOH Interventions:     Readmission Risk Interventions    07/16/2022    3:05 PM  Readmission Risk Prevention Plan  Transportation Screening Complete  PCP or Specialist Appt within 5-7 Days Complete  Home Care Screening Complete  Medication Review (RN CM) Complete

## 2023-04-08 NOTE — Plan of Care (Signed)
Problem: Education: Goal: Knowledge of General Education information will improve Description: Including pain rating scale, medication(s)/side effects and non-pharmacologic comfort measures Outcome: Progressing   Problem: Health Behavior/Discharge Planning: Goal: Ability to manage health-related needs will improve Outcome: Progressing   Problem: Clinical Measurements: Goal: Ability to maintain clinical measurements within normal limits will improve Outcome: Progressing Goal: Will remain free from infection Outcome: Progressing Goal: Diagnostic test results will improve Outcome: Progressing Goal: Respiratory complications will improve Outcome: Progressing Goal: Cardiovascular complication will be avoided Outcome: Progressing   Problem: Elimination: Goal: Will not experience complications related to bowel motility Outcome: Progressing Goal: Will not experience complications related to urinary retention Outcome: Progressing   Problem: Coping: Goal: Level of anxiety will decrease Outcome: Progressing   Problem: Pain Managment: Goal: General experience of comfort will improve Outcome: Progressing

## 2023-04-08 NOTE — Hospital Course (Signed)
The patient is a 57 year old morbidly obese Caucasian female with past medical history significant for but limited to anxiety, depression, chemotherapy induced neuropathy, inflammatory bowel disease which is now status post colostomy, GERD, gastroenteritis, hiatal hernia, history of rectal cancer with status post surgery and diverting colostomy and other comorbidities who presented with abdominal pain and discomfort as well as associated nausea vomiting and diarrhea with decreased colostomy output but no melanotic stools.  This is the fourth time this has happened IBS and she came to the ED and CT scan abdomen pelvis was done and was suspicious for small bowel obstruction as the patient had an increased caliber of the proximal small bowel loops with air-fluid levels with a transition to decrease the caliber small bowel identified within the lower abdomen.  Notably her right-sided hydronephrosis had improved and in the ED she was given 2 L boluses, IV mag sulfate, metoclopramide, hydromorphone and IV famotidine.  NG tube was never placed and patient was able to have some pain control but repeat KUB done today showed persistence.  Despite this the general surgery team feels that her diet can be advanced and she has been placed on a clear liquid diet with advancing to full ambulatory today.  Will have repeat blood work this afternoon.  Assessment and Plan:  SBO (small bowel obstruction) (HCC) -Inpatient/MedSurg. -CT scan done and showed "Postoperative changes from low anterior resection with left abdominal colostomy. Increase caliber of the proximal small bowel loops with air-fluid levels. Transition to decreased caliber distal small bowel is identified within the lower abdomen. Findings are consistent with small bowel obstruction. Post treatment changes identified within the presacral fat region. Within this area, there is a fluid and gas collection which has decreased in size from previous exam. Bilateral  nephroureteral stents are in place. Interval improvement in right-sided hydronephrosis. Aortic Atherosclerosis" -Kept NPO but now general surgery recommending the patient is okay to have clear liquid diet as tolerated advance the diet to full liquid diet -Does not have an NG tube to low intermittent suction now -General Surgery consulted and recommending keeping and optimizing electrolytes with a potassium of greater than 4 and magnesium greater than 2 and mobilizing as well -KUB done and showed "Persistent gaseous small bowel distension centrally, up to 5 cm, consistent with small bowel obstruction."  -IVF with NS + 20 mEQ Kcl at 125 mL/hr now stopped; received 2 L fluid boluses -C/w buprenorphine 1 film buccal every 12 hours, hydromorphone 0.5 mg every 2 hours as needed severe pain with Narcan and continue with antiemetics with ondansetron 4 mg IV every 6 as needed for nausea vomiting as well as prochlorperazine 10 mg IV every 6 as needed for refractory nausea vomiting -Appreciate general surgery evaluation and they are hoping this will improve with conservative management however if she fails to improve with conservative management they feel the patient may require exploratory surgery during this admission -Appreciate further care and from General Surgery   Essential Hypertension -C/w Hydralazine 10 mg q4hprn SBP >159 -Currently holding Bisoprolol 5 mg po Daily  -Continue to Monitor BP per Protocol -Last BP reading was 138/74   Acquired Hypothyroidism -Switched to IV levothyroxine on admission given NPO status but no on a CLD per Surgery and once able to tolerate Soft Diet will change back to po -Check TSH in the AM   Hyperglycemia Check fasting glucose in AM. -Continue to Monitor CBGs and Glucose Trend No results for input(s): "GLUCAP" in the last 720 hours. Recent Labs  Lab 04/07/23 0410  GLUCOSE 154*  -Place on Sensitive Novolog SSI AC/HS if Necessary   Peripheral Neuropathy due  to chemotherapy (HCC) -Resume Pregabalin 225 mg po BID once tolerating a better diet as well as Duloxetine   Chronic Pain Syndrome -Resume Home Buprenophrine 750 mcg 1 film Buccal q12h -Resume rest of home narcotics when able to tolerate diet better   Hyperlipidemia -Hold Rosuvastatin 5 mg po at bedtime or now until able to tolerate    Seizure Disorder (HCC) -On IV Levetiracetam 500 mg q12h for now and when tolerating a Soft Diet will switch to po. -C/w Seizure Precautions  Thrombocytosis -Likely Reactive -Platelet Count Trend: Recent Labs  Lab 04/07/23 0410  PLT 477*  -Continue to Monitor and Trend and Repeat pending this Afternoon  GERD (gastroesophageal reflux disease)/GI Prophylaxis -On Famotidine IV 20 mg q12h given that Pantoprazole is listed as an allergy; Can Take Esomeprazole but currently just got started on po  Morbid Obesity -Complicates overall prognosis and care -Estimated body mass index is 40.87 kg/m as calculated from the following:   Height as of 02/14/23: 5\' 11"  (1.803 m).   Weight as of 02/14/23: 132.9 kg.  -Weight Loss and Dietary Counseling given

## 2023-04-08 NOTE — Progress Notes (Signed)
Progress Note     Subjective: Pt reports ongoing abdominal pain and some nausea but no vomiting. Overall appears a little improved. Having liquid ostomy output.  Objective: Vital signs in last 24 hours: Temp:  [99 F (37.2 C)] 99 F (37.2 C) (09/23 1541) Pulse Rate:  [60-90] 60 (09/24 0742) Resp:  [14-19] 18 (09/24 0742) BP: (130-164)/(74-91) 138/74 (09/24 0742) SpO2:  [95 %-98 %] 98 % (09/24 0742)    Intake/Output from previous day: 09/23 0701 - 09/24 0700 In: 1709 [P.O.:60; I.V.:500; IV Piggyback:1149] Out: 300 [Urine:300] Intake/Output this shift: No intake/output data recorded.  PE: General: pleasant, WD/WN female who is laying in bed in NAD Lungs: Respiratory effort nonlabored Abd:  Soft, mild to moderate distension with generalized ttp without rigidity or guarding. Hypoactive BS. No masses, hernias, or organomegaly. Stoma viable. Ostomy bag with liquid brown stool in bag. Prior abdominal scars well healed.    Lab Results:  Recent Labs    04/07/23 0410  WBC 11.3*  HGB 13.8  HCT 45.0  PLT 477*   BMET Recent Labs    04/07/23 0410  NA 137  K 3.6  CL 101  CO2 24  GLUCOSE 154*  BUN 14  CREATININE 0.67  CALCIUM 10.1   PT/INR No results for input(s): "LABPROT", "INR" in the last 72 hours. CMP     Component Value Date/Time   NA 137 04/07/2023 0410   NA 140 05/30/2017 1009   K 3.6 04/07/2023 0410   K 4.3 05/30/2017 1009   CL 101 04/07/2023 0410   CO2 24 04/07/2023 0410   CO2 25 05/30/2017 1009   GLUCOSE 154 (H) 04/07/2023 0410   GLUCOSE 97 05/30/2017 1009   BUN 14 04/07/2023 0410   BUN 17.7 05/30/2017 1009   CREATININE 0.67 04/07/2023 0410   CREATININE 0.71 07/01/2019 1339   CREATININE 0.7 05/30/2017 1009   CALCIUM 10.1 04/07/2023 0410   CALCIUM 9.7 05/30/2017 1009   PROT 9.3 (H) 04/07/2023 0410   PROT 7.2 05/30/2017 1009   ALBUMIN 3.8 04/07/2023 0410   ALBUMIN 3.8 05/30/2017 1009   AST 17 04/07/2023 0410   AST 14 (L) 07/01/2019 1339    AST 16 05/30/2017 1009   ALT 16 04/07/2023 0410   ALT 12 07/01/2019 1339   ALT 22 05/30/2017 1009   ALKPHOS 73 04/07/2023 0410   ALKPHOS 89 05/30/2017 1009   BILITOT 0.5 04/07/2023 0410   BILITOT 0.3 07/01/2019 1339   BILITOT 0.52 05/30/2017 1009   GFRNONAA >60 04/07/2023 0410   GFRNONAA >60 07/01/2019 1339   GFRAA >60 08/25/2019 1326   GFRAA >60 07/01/2019 1339   Lipase     Component Value Date/Time   LIPASE 20 04/07/2023 0410       Studies/Results: DG Abd Portable 1V  Result Date: 04/08/2023 CLINICAL DATA:  Small bowel obstruction. EXAM: PORTABLE ABDOMEN - 1 VIEW COMPARISON:  Radiographs and CT yesterday FINDINGS: Persistent gaseous small bowel distension centrally, up to 5 cm. Bilateral nephroureteral stents in place. Clearing of retained contrast in the renal collecting systems. IMPRESSION: Persistent gaseous small bowel distension centrally, up to 5 cm, consistent with small bowel obstruction. Electronically Signed   By: Narda Rutherford M.D.   On: 04/08/2023 10:27   DG Abd Portable 1V  Result Date: 04/07/2023 CLINICAL DATA:  Small-bowel obstruction EXAM: PORTABLE ABDOMEN - 1 VIEW COMPARISON:  10/30/2022; CT abdomen and pelvis-03/18/2023 FINDINGS: There is moderate-to-marked gaseous distention of multiple loops small bowel with index loop of small bowel  measuring 4.9 cm in diameter with apparent mild bowel wall thickening. A small amount of distal colonic gas is seen within the ascending colon. Nondiagnostic evaluation for pneumoperitoneum secondary to supine positioning and exclusion of the lower thorax. No definite pneumatosis or portal venous gas Post bilateral ureteral stent placement. Excreted contrast is seen within the bilateral renal collecting system, extending to the level of the urinary bladder. Mild right-sided pelvicaliectasis. Phleboliths overlie the lower pelvis bilaterally. Moderate degenerative change of the bilateral hips with joint space loss, subchondral  sclerosis and osteophytosis. IMPRESSION: 1. Findings compatible with known small bowel obstruction with associated small bowel wall thickening as demonstrated on preceding abdominal CT. 2. Post bilateral ureteral stent placement with mild right-sided pelvicaliectasis. Electronically Signed   By: Simonne Come M.D.   On: 04/07/2023 13:31   CT ABDOMEN PELVIS W CONTRAST  Result Date: 04/07/2023 CLINICAL DATA:  Evaluate for bowel obstruction. Complains of vomiting, upper abdominal pain and diarrhea. History of rectal cancer, status post low anterior resection. * Tracking Code: BO * EXAM: CT ABDOMEN AND PELVIS WITH CONTRAST TECHNIQUE: Multidetector CT imaging of the abdomen and pelvis was performed using the standard protocol following bolus administration of intravenous contrast. RADIATION DOSE REDUCTION: This exam was performed according to the departmental dose-optimization program which includes automated exposure control, adjustment of the mA and/or kV according to patient size and/or use of iterative reconstruction technique. CONTRAST:  OMNIPAQUE IOHEXOL 300 MG/ML  SOLN COMPARISON:  02/23/2023 FINDINGS: Lower chest: No acute abnormality. Hepatobiliary: No focal liver abnormality is seen. No gallstones, gallbladder wall thickening, or biliary dilatation. Pancreas: Unremarkable. No pancreatic ductal dilatation or surrounding inflammatory changes. Spleen: Normal in size without focal abnormality. Adrenals/Urinary Tract: Normal adrenal glands. Mild bilateral renal cortical scarring. Bilateral nephroureteral stents are in place. Interval improvement in right-sided hydronephrosis. Scratch there is mild right-sided hydronephrosis, improved from previous exam. Stable left pelvocaliectasis. No focal bladder abnormality. Stomach/Bowel: Postoperative changes from low anterior resection with left abdominal colostomy. Fluid distension of the stomach. Increase caliber of the proximal small bowel loops with air-fluid  levels. Small bowel loops now measure up to 4.9 cm, image 48/2. Transition to decreased caliber distal small bowel is identified within the lower abdomen, image 69/2. The colon is normal in caliber of to the colostomy site. Vascular/Lymphatic: Aortic atherosclerosis. Mild right lower quadrant mesenteric adenopathy. Index lymph node measures 1 cm, image 67/2. Previously this measured the same. No additional adenopathy identified. Reproductive: Status post hysterectomy. No adnexal masses. Other: Post treatment changes identified within the presacral fat region. Increased soft tissue in this area measures 9.1 x 5.8 cm, image 83/2. This appears unchanged in size from previous exam. Central focus of fluid and gas within this area measures 4.6 x 1.7 cm, image 85/2. Previously 5.9 x 2.8 cm previously. Musculoskeletal: Postoperative changes identified within the lower lumbar spine. No acute or suspicious osseous bone lesions. IMPRESSION: 1. Postoperative changes from low anterior resection with left abdominal colostomy. 2. Increase caliber of the proximal small bowel loops with air-fluid levels. Transition to decreased caliber distal small bowel is identified within the lower abdomen. Findings are consistent with small bowel obstruction. 3. Post treatment changes identified within the presacral fat region. Within this area, there is a fluid and gas collection which has decreased in size from previous exam. 4. Bilateral nephroureteral stents are in place. Interval improvement in right-sided hydronephrosis. 5. Aortic Atherosclerosis (ICD10-I70.0). Electronically Signed   By: Signa Kell M.D.   On: 04/07/2023 05:31  Anti-infectives: Anti-infectives (From admission, onward)    None        Assessment/Plan  SBO - CT w/ transition within the lower abdomen concerning for SBO. Complex surgical hx. Hx of SBO in 10/2020 and 02/2023 that resolved with conservative management.  - KUB this AM with persistent SBO but  patient having colostomy output  - ok to have CLD as tolerated and advance to FLD as tolerated - Keep K > 4, Mg > 2 and mobilize as able for bowel function - Hopefully patient will improve with conservative management. If patient fails to improve with conservative management, they may require exploratory surgery during admission - Agree with medical admission. We will follow with you.   FEN - CLD and ADAT to FLD, IVF per [facility] VTE - SCDs, okay for chem ppx from a general surgery standpoint ID - None   LOS: 1 day   I reviewed hospitalist notes, last 24 h vitals and pain scores, last 48 h intake and output, last 24 h labs and trends, and last 24 h imaging results.   Juliet Rude, [facility] 04/08/2023, 11:36 AM Please see Amion for pager number during day hours 7:00am-4:30pm

## 2023-04-09 ENCOUNTER — Inpatient Hospital Stay (HOSPITAL_COMMUNITY): Payer: Medicare HMO

## 2023-04-09 DIAGNOSIS — I10 Essential (primary) hypertension: Secondary | ICD-10-CM | POA: Diagnosis not present

## 2023-04-09 DIAGNOSIS — K56609 Unspecified intestinal obstruction, unspecified as to partial versus complete obstruction: Secondary | ICD-10-CM | POA: Diagnosis not present

## 2023-04-09 DIAGNOSIS — R739 Hyperglycemia, unspecified: Secondary | ICD-10-CM | POA: Diagnosis not present

## 2023-04-09 DIAGNOSIS — E039 Hypothyroidism, unspecified: Secondary | ICD-10-CM | POA: Diagnosis not present

## 2023-04-09 MED ORDER — HYDROCODONE-ACETAMINOPHEN 7.5-325 MG PO TABS
1.0000 | ORAL_TABLET | ORAL | Status: DC | PRN
  Administered 2023-04-09: 1 via ORAL
  Filled 2023-04-09: qty 1

## 2023-04-09 MED ORDER — HYDROCODONE-ACETAMINOPHEN 7.5-325 MG PO TABS: 1.0000 | ORAL_TABLET | Freq: Two times a day (BID) | ORAL | Status: DC

## 2023-04-09 MED ORDER — HYOSCYAMINE SULFATE 0.125 MG SL SUBL: 0.1250 mg | SUBLINGUAL_TABLET | Freq: Three times a day (TID) | SUBLINGUAL | Status: DC | PRN

## 2023-04-09 MED ORDER — BUPRENORPHINE HCL 750 MCG BU FILM: 1.0000 | ORAL_FILM | Freq: Two times a day (BID) | BUCCAL | Status: DC

## 2023-04-09 MED ORDER — LEVETIRACETAM 500 MG PO TABS
500.0000 mg | ORAL_TABLET | Freq: Two times a day (BID) | ORAL | Status: DC
  Administered 2023-04-09 – 2023-04-13 (×9): 500 mg via ORAL
  Filled 2023-04-09 (×9): qty 1

## 2023-04-09 MED ORDER — ROSUVASTATIN CALCIUM 5 MG PO TABS
5.0000 mg | ORAL_TABLET | Freq: Every day | ORAL | Status: DC
  Administered 2023-04-09 – 2023-04-12 (×4): 5 mg via ORAL
  Filled 2023-04-09 (×4): qty 1

## 2023-04-09 MED ORDER — LEVOTHYROXINE SODIUM 50 MCG PO TABS
50.0000 ug | ORAL_TABLET | Freq: Every day | ORAL | Status: DC
  Administered 2023-04-09 – 2023-04-13 (×5): 50 ug via ORAL
  Filled 2023-04-09 (×5): qty 1

## 2023-04-09 MED ORDER — OXYBUTYNIN CHLORIDE ER 5 MG PO TB24
15.0000 mg | ORAL_TABLET | Freq: Every day | ORAL | Status: DC
  Administered 2023-04-09 – 2023-04-13 (×5): 15 mg via ORAL
  Filled 2023-04-09 (×4): qty 3
  Filled 2023-04-09: qty 1
  Filled 2023-04-09: qty 3

## 2023-04-09 MED ORDER — DULOXETINE HCL 30 MG PO CPEP
120.0000 mg | ORAL_CAPSULE | Freq: Every evening | ORAL | Status: DC
  Administered 2023-04-09 – 2023-04-12 (×4): 120 mg via ORAL
  Filled 2023-04-09 (×4): qty 4

## 2023-04-09 MED ORDER — HYDROMORPHONE HCL 2 MG PO TABS
4.0000 mg | ORAL_TABLET | Freq: Four times a day (QID) | ORAL | Status: DC | PRN
  Administered 2023-04-09 – 2023-04-11 (×6): 4 mg via ORAL
  Filled 2023-04-09 (×6): qty 2

## 2023-04-09 MED ORDER — PANTOPRAZOLE SODIUM 40 MG PO TBEC
40.0000 mg | DELAYED_RELEASE_TABLET | Freq: Every day | ORAL | Status: DC
  Administered 2023-04-09 – 2023-04-13 (×5): 40 mg via ORAL
  Filled 2023-04-09 (×5): qty 1

## 2023-04-09 MED ORDER — HYDROMORPHONE HCL 1 MG/ML IJ SOLN: 1.0000 mg | INTRAMUSCULAR | Status: DC | PRN

## 2023-04-09 MED ORDER — MELATONIN 3 MG PO TABS
3.0000 mg | ORAL_TABLET | Freq: Every evening | ORAL | Status: DC | PRN
  Administered 2023-04-09: 6 mg via ORAL
  Filled 2023-04-09: qty 2

## 2023-04-09 MED ORDER — HYOSCYAMINE SULFATE 0.125 MG PO TABS: 0.1250 mg | ORAL_TABLET | Freq: Three times a day (TID) | ORAL | Status: DC | PRN

## 2023-04-09 MED ORDER — MORPHINE SULFATE ER 15 MG PO TBCR
15.0000 mg | EXTENDED_RELEASE_TABLET | Freq: Two times a day (BID) | ORAL | Status: DC
  Administered 2023-04-09 – 2023-04-13 (×9): 15 mg via ORAL
  Filled 2023-04-09 (×9): qty 1

## 2023-04-09 MED ORDER — PREGABALIN 75 MG PO CAPS
225.0000 mg | ORAL_CAPSULE | Freq: Two times a day (BID) | ORAL | Status: DC
  Administered 2023-04-09 – 2023-04-13 (×9): 225 mg via ORAL
  Filled 2023-04-09 (×9): qty 3

## 2023-04-09 MED ORDER — HYDROMORPHONE HCL 1 MG/ML IJ SOLN
0.5000 mg | INTRAMUSCULAR | Status: DC | PRN
  Administered 2023-04-09: 0.5 mg via INTRAVENOUS
  Filled 2023-04-09: qty 0.5

## 2023-04-09 MED ORDER — HYDROMORPHONE HCL 1 MG/ML IJ SOLN
1.0000 mg | INTRAMUSCULAR | Status: AC | PRN
  Administered 2023-04-09 – 2023-04-11 (×5): 1 mg via INTRAVENOUS
  Filled 2023-04-09 (×5): qty 1

## 2023-04-09 MED ORDER — DULOXETINE HCL 30 MG PO CPEP: 120.0000 mg | ORAL_CAPSULE | Freq: Every evening | ORAL | Status: DC

## 2023-04-09 MED ORDER — BISOPROLOL FUMARATE 5 MG PO TABS
5.0000 mg | ORAL_TABLET | Freq: Every day | ORAL | Status: DC
  Administered 2023-04-09 – 2023-04-13 (×5): 5 mg via ORAL
  Filled 2023-04-09 (×5): qty 1

## 2023-04-09 NOTE — Progress Notes (Signed)
Progress Note     Subjective: Pt reports ongoing abdominal pain and some nausea but no vomiting. Less ostomy output since yesterday. She really does not want an NGT. Ambulating.   Objective: Vital signs in last 24 hours: Temp:  [97.8 F (36.6 C)-98 F (36.7 C)] 98 F (36.7 C) (09/25 0410) Pulse Rate:  [59-68] 59 (09/25 0410) Resp:  [18-22] 18 (09/25 0410) BP: (125-144)/(70-81) 125/70 (09/25 0410) SpO2:  [97 %-99 %] 98 % (09/25 0410) Weight:  [138.3 kg] 138.3 kg (09/25 0500)    Intake/Output from previous day: 09/24 0701 - 09/25 0700 In: 340 [P.O.:240; IV Piggyback:100] Out: 1500 [Urine:1500] Intake/Output this shift: No intake/output data recorded.  PE: General: pleasant, WD/WN female who is ambulating in room Lungs: Respiratory effort nonlabored Abd:  Stoma viable. Scant liquid stool. Prior abdominal scars well healed.    Lab Results:  Recent Labs    04/07/23 0410 04/08/23 1435  WBC 11.3* 7.5  HGB 13.8 10.3*  HCT 45.0 34.2*  PLT 477* 287   BMET Recent Labs    04/07/23 0410 04/08/23 1435  NA 137 138  K 3.6 4.3  CL 101 108  CO2 24 23  GLUCOSE 154* 117*  BUN 14 7  CREATININE 0.67 0.55  CALCIUM 10.1 8.6*   PT/INR No results for input(s): "LABPROT", "INR" in the last 72 hours. CMP     Component Value Date/Time   NA 138 04/08/2023 1435   NA 140 05/30/2017 1009   K 4.3 04/08/2023 1435   K 4.3 05/30/2017 1009   CL 108 04/08/2023 1435   CO2 23 04/08/2023 1435   CO2 25 05/30/2017 1009   GLUCOSE 117 (H) 04/08/2023 1435   GLUCOSE 97 05/30/2017 1009   BUN 7 04/08/2023 1435   BUN 17.7 05/30/2017 1009   CREATININE 0.55 04/08/2023 1435   CREATININE 0.71 07/01/2019 1339   CREATININE 0.7 05/30/2017 1009   CALCIUM 8.6 (L) 04/08/2023 1435   CALCIUM 9.7 05/30/2017 1009   PROT 7.2 04/08/2023 1435   PROT 7.2 05/30/2017 1009   ALBUMIN 3.1 (L) 04/08/2023 1435   ALBUMIN 3.8 05/30/2017 1009   AST 19 04/08/2023 1435   AST 14 (L) 07/01/2019 1339   AST 16  05/30/2017 1009   ALT 12 04/08/2023 1435   ALT 12 07/01/2019 1339   ALT 22 05/30/2017 1009   ALKPHOS 56 04/08/2023 1435   ALKPHOS 89 05/30/2017 1009   BILITOT 0.8 04/08/2023 1435   BILITOT 0.3 07/01/2019 1339   BILITOT 0.52 05/30/2017 1009   GFRNONAA >60 04/08/2023 1435   GFRNONAA >60 07/01/2019 1339   GFRAA >60 08/25/2019 1326   GFRAA >60 07/01/2019 1339   Lipase     Component Value Date/Time   LIPASE 20 04/07/2023 0410       Studies/Results: DG Abd Portable 1V  Result Date: 04/08/2023 CLINICAL DATA:  Small bowel obstruction. EXAM: PORTABLE ABDOMEN - 1 VIEW COMPARISON:  Radiographs and CT yesterday FINDINGS: Persistent gaseous small bowel distension centrally, up to 5 cm. Bilateral nephroureteral stents in place. Clearing of retained contrast in the renal collecting systems. IMPRESSION: Persistent gaseous small bowel distension centrally, up to 5 cm, consistent with small bowel obstruction. Electronically Signed   By: Narda Rutherford M.D.   On: 04/08/2023 10:27   DG Abd Portable 1V  Result Date: 04/07/2023 CLINICAL DATA:  Small-bowel obstruction EXAM: PORTABLE ABDOMEN - 1 VIEW COMPARISON:  10/30/2022; CT abdomen and pelvis-03/18/2023 FINDINGS: There is moderate-to-marked gaseous distention of multiple loops small bowel  with index loop of small bowel measuring 4.9 cm in diameter with apparent mild bowel wall thickening. A small amount of distal colonic gas is seen within the ascending colon. Nondiagnostic evaluation for pneumoperitoneum secondary to supine positioning and exclusion of the lower thorax. No definite pneumatosis or portal venous gas Post bilateral ureteral stent placement. Excreted contrast is seen within the bilateral renal collecting system, extending to the level of the urinary bladder. Mild right-sided pelvicaliectasis. Phleboliths overlie the lower pelvis bilaterally. Moderate degenerative change of the bilateral hips with joint space loss, subchondral sclerosis and  osteophytosis. IMPRESSION: 1. Findings compatible with known small bowel obstruction with associated small bowel wall thickening as demonstrated on preceding abdominal CT. 2. Post bilateral ureteral stent placement with mild right-sided pelvicaliectasis. Electronically Signed   By: Simonne Come M.D.   On: 04/07/2023 13:31    Anti-infectives: Anti-infectives (From admission, onward)    None        Assessment/Plan  SBO - CT w/ transition within the lower abdomen concerning for SBO. Complex surgical hx. Hx of SBO in 10/2020 and 02/2023 that resolved with conservative management.  - KUB this AM pending but patient with less ostomy output - clinically not better but not worsened either  - ok to have CLD as tolerated  - Keep K > 4, Mg > 2 and mobilize as able for bowel function - Hopefully patient will improve with conservative management. If patient fails to improve with conservative management, they may require exploratory surgery during admission - Agree with medical admission. We will follow with you.   FEN - CLD, IVF per TRH VTE - SCDs, okay for chem ppx from a general surgery standpoint ID - None   LOS: 2 days   I reviewed hospitalist notes, last 24 h vitals and pain scores, last 48 h intake and output, last 24 h labs and trends, and last 24 h imaging results.   Juliet Rude, [facility] 04/09/2023, 10:12 AM Please see Amion for pager number during day hours 7:00am-4:30pm

## 2023-04-09 NOTE — Plan of Care (Signed)
  Problem: Education: Goal: Knowledge of General Education information will improve Description: Including pain rating scale, medication(s)/side effects and non-pharmacologic comfort measures Outcome: Progressing   Problem: Skin Integrity: Goal: Risk for impaired skin integrity will decrease Outcome: Progressing   Problem: Safety: Goal: Ability to remain free from injury will improve Outcome: Progressing   Problem: Pain Managment: Goal: General experience of comfort will improve Outcome: Progressing   Problem: Elimination: Goal: Will not experience complications related to bowel motility Outcome: Progressing Goal: Will not experience complications related to urinary retention Outcome: Progressing   Problem: Coping: Goal: Level of anxiety will decrease Outcome: Progressing   Problem: Nutrition: Goal: Adequate nutrition will be maintained Outcome: Progressing

## 2023-04-09 NOTE — Progress Notes (Signed)
Pt called this nurse into the room stated she was looking at her chart and noticed that her hgb dropped 3 pts (13.8-10.3), states the last time she was in the hospital the same thing happened and her stents had moved and had to be replaced. Pt says she always has a blood tinge to her urine but earlier today it was more bloody than usual, this RN looked at her urine and noticed very minimal blood. Pt did get 4L of fluids within the last day educated that it can cause a drop, but pt is still concerned about her hgb and her stents being dislodged. Notified Johann Capers NP on call, said it was "likely the fluid and thrombocytosis" no new orders at this time.

## 2023-04-09 NOTE — Progress Notes (Signed)
[facility] PROGRESS NOTE    Progress Note  Lindsay Little  ZOX:[id number] DOB: Jul 14, 1966 DOA: 04/07/2023 PCP: Laurann Montana, MD     Brief Narrative:   Lindsay Little is an 57 y.o. female past medical history of anxiety chemotherapy-induced neuropathy, inflammatory bowel disease, history of rectal cancer status post surgical intervention with a diverting colostomy comes in with abdominal pain nausea and vomiting and decreased colostomy output, she denies any melanotic stools, CT scan of the abdomen and pelvis with suspicion for small bowel obstruction, was fluid resuscitated in the ED was giving IV magnesium, Reglan and narcotics, NG tube was never placed, KUB was repeated that showed persistent small bowel obstruction, general surgery was consulted who recommended to continue conservative management and start on a clear liquid diet   Assessment/Plan:   SBO (small bowel obstruction) (HCC) Appreciated on CT scan. With multiple small bowel obstruction admission back in 2022 and this year. General surgery was consulted recommended conservative management. Try to keep potassium greater than 4 magnesium greater than 2 ambulate as tolerated. NG tube was never placed. Patient had diet was advanced to a clear liquid diet, and ostomy output has improved. Currently on Zofran and prochlorperazine  for nausea and vomiting. Resume home meds.  Essential hypertension: Resume bisoprolol blood pressure is trending up.  Hypothyroidism: Started on her home dose, discontinue IV Synthroid.  Hyperglycemia: Likely reactive continue sliding scale insulin.  Peripheral neuropathy: Resume Lyrica.  Chronic pain syndrome: Resume home dose of buprenorphine and home dose of narcotic.  Hyperlipidemia: Resume statins.  Seizure disorder: Resume Keppra.  Thrombocytosis: Likely reactive.  GERD: Changed to Protonix daily.  Morbid obesity: Noted she has been counseled.  DVT  prophylaxis: lovenox Family Communication:none Status is: Inpatient Small bowel obstruction    Code Status:     Code Status Orders  (From admission, onward)           Start     Ordered   04/07/23 0556  Full code  Continuous       Question:  By:  Answer:  Consent: discussion documented in EHR   04/07/23 0555           Code Status History     Date Active Date Inactive Code Status Order ID Comments User Context   02/15/2023 0846 02/18/2023 1550 Full Code [id number]  Maryln Gottron, MD ED   10/27/2022 0758 10/31/2022 1624 Full Code [id number]  Maryln Gottron, MD Inpatient   07/28/2022 0049 08/03/2022 1559 Full Code [id number]  Howerter, Chaney Born, DO Inpatient   07/15/2022 0832 07/20/2022 1804 Full Code [id number]  Bobette Mo, MD ED   02/15/2022 1429 02/18/2022 2022 Full Code [id number]  Teddy Spike, DO Inpatient   10/13/2020 0020 10/17/2020 1725 Full Code [id number]  Bobette Mo, MD Inpatient   07/15/2019 0816 07/21/2019 2107 Full Code [id number]  Clydie Braun, MD ED   07/23/2018 1506 07/26/2018 1324 Full Code [id number]  [name], MD Inpatient   04/24/2018 1057 04/24/2018 2056 Full Code [id number]  Tia Alert, MD Inpatient   02/12/2018 1314 02/13/2018 2126 Full Code [id number]  Tia Alert, MD Inpatient   07/10/2016 1848 07/17/2016 1638 Full Code [id number]  [name], MD Inpatient   07/10/2016 1522 07/10/2016 1812 Full Code [id number]  [name], MD Outpatient   05/31/2016 1138 06/03/2016 1834 Full Code [id number]  [name], MD Inpatient   12/20/2015 0727 12/22/2015 1208 Full Code [id number]  [name], MD Inpatient   12/19/2015  2006 12/20/2015 0727 Full Code [id number]  Glenna Fellows, MD Inpatient   12/05/2015 1514 12/07/2015 1935 Full Code [id number]  Duncan Dull ED   09/15/2015 1720 09/17/2015 1510 Full Code [id number]  [name], MD Inpatient   06/23/2015 1611 06/29/2015 1355 Full Code [id number]  [name], MD Inpatient   05/25/2015 1609  05/29/2015 1313 Full Code [id number]  [name], MD Inpatient         IV Access:   Peripheral IV   Procedures and diagnostic studies:   DG Abd Portable 1V  Result Date: 04/08/2023 CLINICAL DATA:  Small bowel obstruction. EXAM: PORTABLE ABDOMEN - 1 VIEW COMPARISON:  Radiographs and CT yesterday FINDINGS: Persistent gaseous small bowel distension centrally, up to 5 cm. Bilateral nephroureteral stents in place. Clearing of retained contrast in the renal collecting systems. IMPRESSION: Persistent gaseous small bowel distension centrally, up to 5 cm, consistent with small bowel obstruction. Electronically Signed   By: Narda Rutherford M.D.   On: 04/08/2023 10:27   DG Abd Portable 1V  Result Date: 04/07/2023 CLINICAL DATA:  Small-bowel obstruction EXAM: PORTABLE ABDOMEN - 1 VIEW COMPARISON:  10/30/2022; CT abdomen and pelvis-03/18/2023 FINDINGS: There is moderate-to-marked gaseous distention of multiple loops small bowel with index loop of small bowel measuring 4.9 cm in diameter with apparent mild bowel wall thickening. A small amount of distal colonic gas is seen within the ascending colon. Nondiagnostic evaluation for pneumoperitoneum secondary to supine positioning and exclusion of the lower thorax. No definite pneumatosis or portal venous gas Post bilateral ureteral stent placement. Excreted contrast is seen within the bilateral renal collecting system, extending to the level of the urinary bladder. Mild right-sided pelvicaliectasis. Phleboliths overlie the lower pelvis bilaterally. Moderate degenerative change of the bilateral hips with joint space loss, subchondral sclerosis and osteophytosis. IMPRESSION: 1. Findings compatible with known small bowel obstruction with associated small bowel wall thickening as demonstrated on preceding abdominal CT. 2. Post bilateral ureteral stent placement with mild right-sided pelvicaliectasis. Electronically Signed   By: Simonne Come M.D.   On: 04/07/2023  13:31     Medical Consultants:   None.   Subjective:    Lindsay Little relates her abdominal pain is better and has had no nausea.  Objective:    Vitals:   04/08/23 1620 04/08/23 2011 04/09/23 0410 04/09/23 0500  BP: (!) 144/81 130/76 125/70   Pulse: 68 63 (!) 59   Resp:  (!) 22 18   Temp:  97.8 F (36.6 C) 98 F (36.7 C)   TempSrc:   Oral   SpO2: 99% 97% 98%   Weight:    (!) 138.3 kg   SpO2: 98 %   Intake/Output Summary (Last 24 hours) at 04/09/2023 0904 Last data filed at 04/09/2023 0300 Gross per 24 hour  Intake 340 ml  Output 1500 ml  Net -1160 ml   Filed Weights   04/09/23 0500  Weight: (!) 138.3 kg    Exam: General exam: In no acute distress. Respiratory system: Good air movement and clear to auscultation. Cardiovascular system: S1 & S2 heard, RRR. No JVD. Gastrointestinal system: Abdomen is nondistended, soft and nontender.  Extremities: No pedal edema. Skin: No rashes, lesions or ulcers Psychiatry: Judgement and insight appear normal. Mood & affect appropriate.    Data Reviewed:    Labs: Basic Metabolic Panel: Recent Labs  Lab 04/07/23 0410 04/08/23 1435  NA 137 138  K 3.6 4.3  CL 101 108  CO2 24 23  GLUCOSE 154* 117*  BUN 14 7  CREATININE 0.67 0.55  CALCIUM 10.1 8.6*  MG 1.9 2.0  PHOS  --  2.0*   GFR Estimated Creatinine Clearance: 121.2 mL/min (by C-G formula based on SCr of 0.55 mg/dL). Liver Function Tests: Recent Labs  Lab 04/07/23 0410 04/08/23 1435  AST 17 19  ALT 16 12  ALKPHOS 73 56  BILITOT 0.5 0.8  PROT 9.3* 7.2  ALBUMIN 3.8 3.1*   Recent Labs  Lab 04/07/23 0410  LIPASE 20   No results for input(s): "AMMONIA" in the last 168 hours. Coagulation profile No results for input(s): "INR", "PROTIME" in the last 168 hours. COVID-19 Labs  No results for input(s): "DDIMER", "FERRITIN", "LDH", "CRP" in the last 72 hours.  Lab Results  Component Value Date   SARSCOV2NAA NEGATIVE 10/12/2020   SARSCOV2NAA  NEGATIVE 07/15/2019    CBC: Recent Labs  Lab 04/07/23 0410 04/08/23 1435  WBC 11.3* 7.5  NEUTROABS 9.3* 5.0  HGB 13.8 10.3*  HCT 45.0 34.2*  MCV 82.4 84.9  PLT 477* 287   Cardiac Enzymes: No results for input(s): "CKTOTAL", "CKMB", "CKMBINDEX", "TROPONINI" in the last 168 hours. BNP (last 3 results) No results for input(s): "PROBNP" in the last 8760 hours. CBG: No results for input(s): "GLUCAP" in the last 168 hours. D-Dimer: No results for input(s): "DDIMER" in the last 72 hours. Hgb A1c: No results for input(s): "HGBA1C" in the last 72 hours. Lipid Profile: No results for input(s): "CHOL", "HDL", "LDLCALC", "TRIG", "CHOLHDL", "LDLDIRECT" in the last 72 hours. Thyroid function studies: No results for input(s): "TSH", "T4TOTAL", "T3FREE", "THYROIDAB" in the last 72 hours.  Invalid input(s): "FREET3" Anemia work up: No results for input(s): "VITAMINB12", "FOLATE", "FERRITIN", "TIBC", "IRON", "RETICCTPCT" in the last 72 hours. Sepsis Labs: Recent Labs  Lab 04/07/23 0410 04/08/23 1435  WBC 11.3* 7.5   Microbiology No results found for this or any previous visit (from the past 240 hour(s)).   Medications:    polyethylene glycol  17 g Oral Daily   Continuous Infusions:  famotidine (PEPCID) IV 20 mg (04/09/23 0537)   levETIRAcetam 500 mg (04/08/23 2113)      LOS: 2 days   Marinda Elk  [facility]  04/09/2023, 9:04 AM

## 2023-04-10 DIAGNOSIS — I10 Essential (primary) hypertension: Secondary | ICD-10-CM | POA: Diagnosis not present

## 2023-04-10 DIAGNOSIS — E039 Hypothyroidism, unspecified: Secondary | ICD-10-CM | POA: Diagnosis not present

## 2023-04-10 DIAGNOSIS — K56609 Unspecified intestinal obstruction, unspecified as to partial versus complete obstruction: Secondary | ICD-10-CM | POA: Diagnosis not present

## 2023-04-10 DIAGNOSIS — R1900 Intra-abdominal and pelvic swelling, mass and lump, unspecified site: Secondary | ICD-10-CM | POA: Insufficient documentation

## 2023-04-10 DIAGNOSIS — R739 Hyperglycemia, unspecified: Secondary | ICD-10-CM | POA: Diagnosis not present

## 2023-04-10 LAB — RENAL FUNCTION PANEL
Albumin: 3.5 g/dL (ref 3.5–5.0)
Anion gap: 10 (ref 5–15)
BUN: 7 mg/dL (ref 6–20)
CO2: 25 mmol/L (ref 22–32)
Calcium: 9.4 mg/dL (ref 8.9–10.3)
Chloride: 101 mmol/L (ref 98–111)
Creatinine, Ser: 0.86 mg/dL (ref 0.44–1.00)
GFR, Estimated: >60 mL/min (ref >60)
Glucose, Bld: 138 mg/dL — ABNORMAL HIGH (ref 70–99)
Phosphorus: 4 mg/dL (ref 2.5–4.6)
Potassium: 3.7 mmol/L (ref 3.5–5.1)
Sodium: 136 mmol/L (ref 135–145)

## 2023-04-10 MED ORDER — SIMETHICONE 40 MG/0.6ML PO SUSP: 80.0000 mg | Freq: Four times a day (QID) | ORAL | Status: DC | PRN

## 2023-04-10 MED ORDER — K PHOS MONO-SOD PHOS DI & MONO 155-852-130 MG PO TABS
500.0000 mg | ORAL_TABLET | Freq: Two times a day (BID) | ORAL | Status: AC
  Administered 2023-04-10 (×2): 500 mg via ORAL
  Filled 2023-04-10 (×2): qty 2

## 2023-04-10 MED ORDER — PHENOL 1.4 % MT LIQD: 2.0000 | OROMUCOSAL | Status: DC | PRN

## 2023-04-10 MED ORDER — MAGIC MOUTHWASH: 15.0000 mL | Freq: Four times a day (QID) | ORAL | Status: DC | PRN

## 2023-04-10 MED ORDER — CALCIUM POLYCARBOPHIL 625 MG PO TABS
625.0000 mg | ORAL_TABLET | Freq: Two times a day (BID) | ORAL | Status: DC
  Administered 2023-04-10 – 2023-04-13 (×7): 625 mg via ORAL
  Filled 2023-04-10 (×7): qty 1

## 2023-04-10 MED ORDER — PROCHLORPERAZINE EDISYLATE 10 MG/2ML IJ SOLN: 5.0000 mg | INTRAMUSCULAR | Status: DC | PRN

## 2023-04-10 MED ORDER — ONDANSETRON HCL 4 MG/2ML IJ SOLN: 4.0000 mg | Freq: Four times a day (QID) | INTRAMUSCULAR | Status: DC | PRN

## 2023-04-10 MED ORDER — LACTATED RINGERS IV BOLUS: 1000.0000 mL | Freq: Three times a day (TID) | INTRAVENOUS | Status: AC | PRN

## 2023-04-10 MED ORDER — SODIUM CHLORIDE 0.9 % IV SOLN: 8.0000 mg | Freq: Four times a day (QID) | INTRAVENOUS | Status: DC | PRN

## 2023-04-10 MED ORDER — MENTHOL 3 MG MT LOZG: 1.0000 | LOZENGE | OROMUCOSAL | Status: DC | PRN

## 2023-04-10 MED ORDER — ALUM & MAG HYDROXIDE-SIMETH 200-200-20 MG/5ML PO SUSP: 30.0000 mL | Freq: Four times a day (QID) | ORAL | Status: DC | PRN

## 2023-04-10 NOTE — Progress Notes (Signed)
[facility] PROGRESS NOTE    Progress Note  ILEAN Little  ZOX:[id number] DOB: Feb 11, 1966 DOA: 04/07/2023 PCP: Laurann Montana, MD     Brief Narrative:   Lindsay Little is an 57 y.o. female past medical history of anxiety chemotherapy-induced neuropathy, inflammatory bowel disease, history of rectal cancer status post surgical intervention with a diverting colostomy comes in with abdominal pain nausea and vomiting and decreased colostomy output, she denies any melanotic stools, CT scan of the abdomen and pelvis with suspicion for small bowel obstruction, was fluid resuscitated in the ED was giving IV magnesium, Reglan and narcotics, NG tube was never placed, KUB was repeated that showed persistent small bowel obstruction, general surgery was consulted who recommended to continue conservative management and start on a clear liquid diet   Assessment/Plan:   SBO (small bowel obstruction) (HCC) Appreciated on CT scan. With multiple small bowel obstruction admission back in 2022 and this year. General surgery was consulted recommended conservative management. Try to keep potassium greater than 4 magnesium greater than 2 ambulate as tolerated. She refused NG tube, she would like to try a full liquid diet.  If she fails would recommend NG tube. Currently on Zofran and prochlorperazine  for nausea and vomiting. Rates her pain is controlled. Minimal output through stoma. For management per surgery.  Essential hypertension: Resume bisoprolol blood pressure is trending up.  Hypothyroidism: Resume home dose of Synthroid.  Hyperglycemia: Likely reactive continue sliding scale insulin.  Peripheral neuropathy: Resume Lyrica.  Chronic pain syndrome: She does not have her home medication. She was started on MS Contin and oxycodone. Relates her pain is controlled.  Hyperlipidemia: Resume statins.  Seizure disorder: Resume Keppra.  Thrombocytosis: Likely  reactive.  GERD: Changed to Protonix daily.  Morbid obesity: Noted she has been counseled.  DVT prophylaxis: lovenox Family Communication:none Status is: Inpatient Small bowel obstruction    Code Status:     Code Status Orders  (From admission, onward)           Start     Ordered   04/07/23 0556  Full code  Continuous       Question:  By:  Answer:  Consent: discussion documented in EHR   04/07/23 0555           Code Status History     Date Active Date Inactive Code Status Order ID Comments User Context   02/15/2023 0846 02/18/2023 1550 Full Code [id number]  Maryln Gottron, MD ED   10/27/2022 0758 10/31/2022 1624 Full Code [id number]  Maryln Gottron, MD Inpatient   07/28/2022 0049 08/03/2022 1559 Full Code [id number]  Howerter, Chaney Born, DO Inpatient   07/15/2022 0832 07/20/2022 1804 Full Code [id number]  Bobette Mo, MD ED   02/15/2022 1429 02/18/2022 2022 Full Code [id number]  Teddy Spike, DO Inpatient   10/13/2020 0020 10/17/2020 1725 Full Code [id number]  Bobette Mo, MD Inpatient   07/15/2019 0816 07/21/2019 2107 Full Code [id number]  Clydie Braun, MD ED   07/23/2018 1506 07/26/2018 1324 Full Code [id number]  [name], MD Inpatient   04/24/2018 1057 04/24/2018 2056 Full Code [id number]  Tia Alert, MD Inpatient   02/12/2018 1314 02/13/2018 2126 Full Code [id number]  Tia Alert, MD Inpatient   07/10/2016 1848 07/17/2016 1638 Full Code [id number]  [name], MD Inpatient   07/10/2016 1522 07/10/2016 1812 Full Code [id number]  [name], MD Outpatient   05/31/2016 1138 06/03/2016 1834 Full Code [id number]  [name], MD  Inpatient   12/20/2015 0727 12/22/2015 1208 Full Code [id number]  [name], MD Inpatient   12/19/2015 2006 12/20/2015 0727 Full Code [id number]  Glenna Fellows, MD Inpatient   12/05/2015 1514 12/07/2015 1935 Full Code [id number]  Duncan Dull ED   09/15/2015 1720 09/17/2015 1510 Full Code [id number]  [name], MD Inpatient    06/23/2015 1611 06/29/2015 1355 Full Code [id number]  [name], MD Inpatient   05/25/2015 1609 05/29/2015 1313 Full Code [id number]  [name], MD Inpatient         IV Access:   Peripheral IV   Procedures and diagnostic studies:   DG Abd Portable 1V  Result Date: 04/09/2023 CLINICAL DATA:  Small bowel obstruction. EXAM: PORTABLE ABDOMEN - 1 VIEW COMPARISON:  Radiograph yesterday, additional priors.  CT 04/07/2023 FINDINGS: Diminishing gaseous small bowel distension centrally. Decreasing gaseous distention of colon, small volume of formed colonic stool. Bilateral nephroureteral stents remain in place. IMPRESSION: Diminishing gaseous small bowel distension centrally, radiographic features of improving obstruction. Electronically Signed   By: Narda Rutherford M.D.   On: 04/09/2023 13:17     Medical Consultants:   None.   Subjective:    Lindsay Little relates her pain is controlled, she would like to try a full liquid diet.  Objective:    Vitals:   04/09/23 0500 04/09/23 1229 04/09/23 2021 04/10/23 0602  BP:  120/71 (!) 148/100 139/80  Pulse:  (!) 53 70 61  Resp:  20 20 17   Temp:  98 F (36.7 C) 98 F (36.7 C) 98.2 F (36.8 C)  TempSrc:  Oral    SpO2:  100% 100% 97%  Weight: (!) 138.3 kg      SpO2: 97 %   Intake/Output Summary (Last 24 hours) at 04/10/2023 0842 Last data filed at 04/10/2023 0504 Gross per 24 hour  Intake --  Output 2200 ml  Net -2200 ml   Filed Weights   04/09/23 0500  Weight: (!) 138.3 kg    Exam: General exam: In no acute distress. Respiratory system: Good air movement and clear to auscultation. Cardiovascular system: S1 & S2 heard, RRR. No JVD. Gastrointestinal system: Abdomen is nondistended, soft and nontender.  Ostomy in place no output documented. Extremities: No pedal edema. Skin: No rashes, lesions or ulcers Psychiatry: Judgement and insight appear normal. Mood & affect appropriate.  Data Reviewed:    Labs: Basic  Metabolic Panel: Recent Labs  Lab 04/07/23 0410 04/08/23 1435  NA 137 138  K 3.6 4.3  CL 101 108  CO2 24 23  GLUCOSE 154* 117*  BUN 14 7  CREATININE 0.67 0.55  CALCIUM 10.1 8.6*  MG 1.9 2.0  PHOS  --  2.0*   GFR Estimated Creatinine Clearance: 121.2 mL/min (by C-G formula based on SCr of 0.55 mg/dL). Liver Function Tests: Recent Labs  Lab 04/07/23 0410 04/08/23 1435  AST 17 19  ALT 16 12  ALKPHOS 73 56  BILITOT 0.5 0.8  PROT 9.3* 7.2  ALBUMIN 3.8 3.1*   Recent Labs  Lab 04/07/23 0410  LIPASE 20   No results for input(s): "AMMONIA" in the last 168 hours. Coagulation profile No results for input(s): "INR", "PROTIME" in the last 168 hours. COVID-19 Labs  No results for input(s): "DDIMER", "FERRITIN", "LDH", "CRP" in the last 72 hours.  Lab Results  Component Value Date   SARSCOV2NAA NEGATIVE 10/12/2020   SARSCOV2NAA NEGATIVE 07/15/2019    CBC: Recent Labs  Lab 04/07/23 0410 04/08/23 1435  WBC 11.3*  7.5  NEUTROABS 9.3* 5.0  HGB 13.8 10.3*  HCT 45.0 34.2*  MCV 82.4 84.9  PLT 477* 287   Cardiac Enzymes: No results for input(s): "CKTOTAL", "CKMB", "CKMBINDEX", "TROPONINI" in the last 168 hours. BNP (last 3 results) No results for input(s): "PROBNP" in the last 8760 hours. CBG: No results for input(s): "GLUCAP" in the last 168 hours. D-Dimer: No results for input(s): "DDIMER" in the last 72 hours. Hgb A1c: No results for input(s): "HGBA1C" in the last 72 hours. Lipid Profile: No results for input(s): "CHOL", "HDL", "LDLCALC", "TRIG", "CHOLHDL", "LDLDIRECT" in the last 72 hours. Thyroid function studies: No results for input(s): "TSH", "T4TOTAL", "T3FREE", "THYROIDAB" in the last 72 hours.  Invalid input(s): "FREET3" Anemia work up: No results for input(s): "VITAMINB12", "FOLATE", "FERRITIN", "TIBC", "IRON", "RETICCTPCT" in the last 72 hours. Sepsis Labs: Recent Labs  Lab 04/07/23 0410 04/08/23 1435  WBC 11.3* 7.5   Microbiology No results  found for this or any previous visit (from the past 240 hour(s)).   Medications:    bisoprolol  5 mg Oral Daily   DULoxetine  120 mg Oral QPM   levETIRAcetam  500 mg Oral BID   levothyroxine  50 mcg Oral Q0600   morphine  15 mg Oral Q12H   oxybutynin  15 mg Oral Daily   pantoprazole  40 mg Oral Daily   polyethylene glycol  17 g Oral Daily   pregabalin  225 mg Oral BID   rosuvastatin  5 mg Oral QHS   Continuous Infusions:  famotidine (PEPCID) IV 20 mg (04/10/23 0504)      LOS: 3 days   Marinda Elk  [facility]  04/10/2023, 8:42 AM

## 2023-04-10 NOTE — Discharge Instructions (Signed)
EATING AFTER A SMALL BOWEL OBSTRUCTION   EAT START WITH PUREED OR SOFT FOODS Gradually transition to a high fiber diet with a fiber supplement over the next few days after discharge  WALK Walk an hour a day.  Control your pain to do that.    CONTROL PAIN Control pain so that you can walk, sleep, tolerate sneezing/coughing, go up/down stairs.  HAVE A BOWEL MOVEMENT DAILY Keep your bowels regular to avoid problems.  OK to try a laxative to override constipation.  OK to use an antidairrheal to slow down diarrhea.  Call if not better after 2 tries  CALL IF YOU HAVE PROBLEMS/CONCERNS Call if you are still struggling despite following these instructions. Call if you have concerns not answered by these instructions     After your attack of SMALL BOWEL OBSTRUCTION, expect some issues over the next few weeks.    To help you through this temporary phase, we start you out on a pureed (blenderized) diet.  Your first meal in the hospital was thin liquids.  You should have been given a pureed diet by the time you left the hospital.  We ask patients to stay on a pureed diet for the first few days to avoid anything getting "stuck."  Don't be alarmed if your ability to swallow doesn't progress according to this plan.  Everyone is different and some diets can advance more or less quickly.     Some BASIC RULES to follow are: Maintain an upright position whenever eating or drinking. Take small bites - just a teaspoon size bite at a time. Eat slowly.  It may also help to eat only one food at a time. Consider nibbling through smaller, more frequent meals & avoid the urge to eat BIG meals Do not push through feelings of fullness, nausea, or bloatedness Do not mix solid foods and liquids in the same mouthful Try not to "wash foods down" with large gulps of liquids. Avoid carbonated (bubbly/fizzy) drinks.   Avoid foods that make you feel gassy or bloated.  Start with bland foods first.  Wait on trying  greasy, fried, or spicy meals until you are tolerating more bland solids well. Expect to be more gassy/flatulent/bloated initially.  Walking will help your body manage it better. Consider using medications for bloating that contain simethicone such as  Maalox or Gas-X  Eat in a relaxed atmosphere & minimize distractions. Avoid talking while eating.   Do not use straws. Following each meal, sit in an upright position (90 degree angle) for 60 to 90 minutes.  Going for a short walk can help as well If food does stick, don't panic.  Try to relax and let the food pass on its own.  Sipping WARM LIQUID such as strong hot black tea can also help slide it down.   Be gradual in changes & use common sense:  -If you easily tolerating a certain "level" of foods, advance to the next level gradually -If you are having trouble swallowing a particular food, then avoid it.   -If food is sticking when you advance your diet, go back to thinner previous diet (the lower LEVEL) for 1-2 days.  LEVEL 1 = PUREED DIET  Do for the first FEW DAYS AFTER LEAVING THE HOSPITAL  -Foods in this group are pureed or blenderized to a smooth, mashed potato-like consistency.  -If necessary, the pureed foods can keep their shape with the addition of a thickening agent.   -Meat should be pureed to a  smooth, pasty consistency.  Hot broth or gravy may be added to the pureed meat, approximately 1 oz. of liquid per 3 oz. serving of meat. -CAUTION:  If any foods do not puree into a smooth consistency, swallowing will be more difficult.  (For example, nuts or seeds sometimes do not blend well.)  Hot Foods Cold Foods  Pureed scrambled eggs and cheese Pureed cottage cheese  Baby cereals Thickened juices and nectars  Thinned cooked cereals (no lumps) Thickened milk or eggnog  Pureed [place] toast or pancakes Ensure  Mashed potatoes Ice cream  Pureed parsley, au gratin, scalloped potatoes, candied sweet potatoes Fruit or [place] ice,  sherbet  Pureed buttered or alfredo noodles Plain yogurt  Pureed vegetables (no corn or peas) Instant breakfast  Pureed soups and creamed soups Smooth pudding, mousse, custard  Pureed scalloped apples Whipped gelatin  Gravies Sugar, syrup, honey, jelly  Sauces, cheese, tomato, barbecue, white, creamed Cream  Any baby food Creamer  Alcohol in moderation (not beer or champagne) Margarine  Coffee or tea Mayonnaise   Ketchup, mustard   Apple sauce   SAMPLE MENU:  PUREED DIET Breakfast Lunch Dinner  Orange juice, 1/2 cup Cream of wheat, 1/2 cup Pineapple juice, 1/2 cup Pureed [place], barley soup, 3/4 cup Pureed Hawaiian chicken, 3 oz  Scrambled eggs, mashed or blended with cheese, 1/2 cup Tea or coffee, 1 cup  Whole milk, 1 cup  Non-dairy creamer, 2 Tbsp. Mashed potatoes, 1/2 cup Pureed cooled broccoli, 1/2 cup Apple sauce, 1/2 cup Coffee or tea Mashed potatoes, 1/2 cup Pureed spinach, 1/2 cup Frozen yogurt, 1/2 cup Tea or coffee      LEVEL 2 = SOFT DIET  After your first few days, you can advance to a soft, low residue diet.   Keep on this diet until everything goes down easily.  Hot Foods Cold Foods  White fish Cottage cheese  Stuffed fish Junior baby fruit  Baby food meals Semi thickened juices  Minced soft cooked, scrambled, poached eggs nectars  Souffle & omelets Ripe mashed bananas  Cooked cereals Canned fruit, pineapple sauce, milk  potatoes Milkshake  Buttered or Alfredo noodles Custard  Cooked cooled vegetable Puddings, including tapioca  Sherbet Yogurt  Vegetable soup or alphabet soup Fruit ice, [place] ice  Gravies Whipped gelatin  Sugar, syrup, honey, jelly Junior baby desserts  Sauces:  Cheese, creamed, barbecue, tomato, white Cream  Coffee or tea Margarine   SAMPLE MENU:  LEVEL 2 Breakfast Lunch Dinner  Orange juice, 1/2 cup Oatmeal, 1/2 cup Scrambled eggs with cheese, 1/2 cup Decaffeinated tea, 1 cup Whole milk, 1 cup Non-dairy creamer, 2 Tbsp  Pineapple juice, 1/2 cup Minced beef, 3 oz Gravy, 2 Tbsp Mashed potatoes, 1/2 cup Minced fresh broccoli, 1/2 cup Applesauce, 1/2 cup Coffee, 1 cup [place], barley soup, 3/4 cup Minced Hawaiian chicken, 3 oz Mashed potatoes, 1/2 cup Cooked spinach, 1/2 cup Frozen yogurt, 1/2 cup Non-dairy creamer, 2 Tbsp      LEVEL 3 = CHOPPED DIET  -After all the foods in level 2 (soft diet) are passing through well you should advance up to more chopped foods.  -It is still important to cut these foods into small pieces and eat slowly.  Hot Foods Cold Foods  Poultry Cottage cheese  Chopped Swedish meatballs Yogurt  Meat salads (ground or flaked meat) Milk  Flaked fish (tuna) Milkshakes  Poached or scrambled eggs Soft, cold, dry cereal  Souffles and omelets Fruit juices or nectars  Cooked cereals Chopped canned  fruit  Chopped [place] toast or pancakes Canned fruit cocktail  Noodles or pasta (no rice) Pudding, mousse, custard  Cooked vegetables (no frozen peas, corn, or mixed vegetables) Green salad  Canned small sweet peas Ice cream  Creamed soup or vegetable soup Fruit ice, [place] ice  Pureed vegetable soup or alphabet soup Non-dairy creamer  Ground scalloped apples Margarine  Gravies Mayonnaise  Sauces:  Cheese, creamed, barbecue, tomato, white Ketchup  Coffee or tea Mustard   SAMPLE MENU:  LEVEL 3 Breakfast Lunch Dinner  Orange juice, 1/2 cup Oatmeal, 1/2 cup Scrambled eggs with cheese, 1/2 cup Decaffeinated tea, 1 cup Whole milk, 1 cup Non-dairy creamer, 2 Tbsp Ketchup, 1 Tbsp Margarine, 1 tsp Salt, 1/4 tsp Sugar, 2 tsp Pineapple juice, 1/2 cup Ground beef, 3 oz Gravy, 2 Tbsp Mashed potatoes, 1/2 cup Cooked spinach, 1/2 cup Applesauce, 1/2 cup Decaffeinated coffee Whole milk Non-dairy creamer, 2 Tbsp Margarine, 1 tsp Salt, 1/4 tsp Pureed [place], barley soup, 3/4 cup Barbecue chicken, 3 oz Mashed potatoes, 1/2 cup Ground fresh broccoli, 1/2 cup Frozen yogurt, 1/2  cup Decaffeinated tea, 1 cup Non-dairy creamer, 2 Tbsp Margarine, 1 tsp Salt, 1/4 tsp Sugar, 1 tsp    LEVEL 4:  HIGH FIBER DIET / REGULAR FOODS  -Foods in this group are soft, moist, regularly textured foods.   -This level includes meat and breads, which tend to be the hardest things to swallow.   -Eat very slowly, chew well and continue to avoid carbonated drinks. -most people are at this level in 2-4 weeks  Hot Foods Cold Foods  Baked fish or skinned Soft cheeses - cottage cheese  Souffles and omelets Cream cheese  Eggs Yogurt  Stuffed shells Milk  Spaghetti with meat sauce Milkshakes  Cooked cereal Cold dry cereals (no nuts, dried fruit, coconut)  [place] toast or pancakes Crackers  Buttered toast Fruit juices or nectars  Noodles or pasta (no rice) Canned fruit  Potatoes (all types) Ripe bananas  Soft, cooked vegetables (no corn, lima, or baked beans) Peeled, ripe, fresh fruit  Creamed soups or vegetable soup Cakes (no nuts, dried fruit, coconut)  Canned chicken noodle soup Plain doughnuts  Gravies Ice cream  Bacon dressing Pudding, mousse, custard  Sauces:  Cheese, creamed, barbecue, tomato, white Fruit ice, [place] ice, sherbet  Decaffeinated tea or coffee Whipped gelatin  Pork chops Regular gelatin   Canned fruited gelatin molds   Sugar, syrup, honey, jam, jelly   Cream   Non-dairy   Margarine   Oil   Mayonnaise   Ketchup   Mustard   TROUBLESHOOTING IRREGULAR BOWELS  1) Avoid extremes of bowel movements (no bad constipation/diarrhea)  2) Miralax 17gm mixed in 8oz. water or juice-daily. May use BID as needed.  3) Gas-x,Phazyme, etc. as needed for gas & bloating.  4) Soft,bland diet. No spicy,greasy,fried foods.  5) Prilosec over-the-counter as needed  6) May hold gluten/wheat products from diet to see if symptoms improve.  7) May try probiotics (Align, Activa, etc) to help calm the bowels down  7) If symptoms become worse call back immediately.    If you  have any questions please call our office at [facility]: [phone].     This information is not intended to replace advice given to you by your health care provider. Make sure you discuss any questions you have with your health care provider.      Bowel Obstruction A bowel obstruction is a blockage in the small or large bowel. The  bowel, which is also called the intestine, is a long, slender tube that connects the stomach to the anus. When a person eats and drinks, food and fluids go from the mouth to the stomach to the small bowel. This is where most of the nutrients in the food and fluids are absorbed. After the small bowel, material passes through the large bowel for further absorption until any leftover material leaves the body as stool through the anus during a bowel movement. A bowel obstruction will prevent food and fluids from passing through the bowel as they normally do during digestion. The bowel can become partially or completely blocked. If this condition is not treated, it can be dangerous because the bowel could rupture. What are the causes? Common causes of this condition include: Scar tissue (adhesions) from previous surgery or treatment with high-energy X-rays (radiation). Recent surgery. This may cause the movements of the bowel to slow down and cause food to block the intestine. Inflammatory bowel disease, such as Crohn's disease or diverticulitis. Growths or tumors. A bulging organ (hernia). Twisting of the bowel (volvulus). A foreign body. Slipping of a part of the bowel into another part (intussusception). What are the signs or symptoms? Symptoms of this condition include: Pain in the abdomen. Depending on the degree of obstruction, pain may be: Mild or severe. Dull cramping or sharp pain. In one area or in the entire abdomen. Nausea and vomiting. Vomit may be greenish or a yellow bile color. Bloating in the abdomen. Difficulty passing stool  (constipation). Lack of passing gas. Frequent belching. Diarrhea. This may occur if the obstruction is partial and runny stool is able to leak around the obstruction. How is this diagnosed? This condition may be diagnosed based on: A physical exam. Medical history. Imaging tests of the abdomen or pelvis, such as X-ray or CT scan. Blood or urine tests. How is this treated? Treatment for this condition depends on the cause and severity of the problem. Treatment may include: Fluids and pain medicines that are given through an IV. Your health care provider may instruct you not to eat or drink if you have nausea or vomiting. Eating a simple diet. You may be asked to consume a clear liquid diet for several days. This allows the bowel to rest. Placement of a small tube (nasogastric tube) into the stomach. This will relieve pain, discomfort, and nausea by removing blocked air and fluids from the stomach. It can also help the obstruction clear up faster. Surgery. This may be required if other treatments do not work. Surgery may be required for: Bowel obstruction from a hernia. This can be an emergency procedure. Scar tissue that causes frequent or severe obstructions. Follow these instructions at home: Medicines Take over-the-counter and prescription medicines only as told by your health care provider. If you were prescribed an antibiotic medicine, take it as told by your health care provider. Do not stop taking the antibiotic even if you start to feel better. General instructions Follow instructions from your health care provider about eating restrictions. You may need to avoid solid foods and consume only clear liquids until your condition improves. Return to your normal activities as told by your health care provider. Ask your health care provider what activities are safe for you. Avoid sitting for a long time without moving. Get up to take short walks every 1-2 hours. This is important to improve  blood flow and breathing. Ask for help if you feel weak or unsteady. Keep all follow-up visits  as told by your health care provider. This is important. How is this prevented? After having a bowel obstruction, you are more likely to have another. You may do the following things to prevent another obstruction: If you have a long-term (chronic) disease, pay attention to your symptoms and contact your health care provider if you have questions or concerns. Avoid becoming constipated. To prevent or treat constipation, your health care provider may recommend that you: Drink enough fluid to keep your urine pale yellow. Take over-the-counter or prescription medicines. Eat foods that are high in fiber, such as beans, whole grains, and fresh fruits and vegetables. Limit foods that are high in fat and processed sugars, such as fried or sweet foods. Stay active. Exercise for 30 minutes or more, 5 or more days each week. Ask your health care provider which exercises are safe for you. Avoid stress. Find ways to reduce stress, such as meditation, exercise, or taking time for activities that relax you. Instead of eating three large meals each day, eat three small meals with three small snacks. Work with a Data processing manager to make a healthy meal plan that works for you. Do not use any products that contain nicotine or tobacco, such as cigarettes and e-cigarettes. If you need help quitting, ask your health care provider. Contact a health care provider if you: Have a fever. Have chills. Get help right away if you: Have increased pain or cramping. Vomit blood. Have uncontrolled vomiting or nausea. Cannot drink fluids because of vomiting or pain. Become confused. Begin feeling very thirsty (dehydrated). Have severe bloating. Feel extremely weak or you faint. Summary A bowel obstruction is a blockage in the small or large bowel. A bowel obstruction will prevent food and fluids from passing through the bowel as they  normally do during digestion. Treatment for this condition depends on the cause and severity of the problem. It may include fluids and pain medicines through an IV, a simple diet, a nasogastric tube, or surgery. Follow instructions from your health care provider about eating restrictions. You may need to avoid solid foods and consume only clear liquids until your condition improves.

## 2023-04-10 NOTE — Progress Notes (Addendum)
Lindsay Little  06-22-1966 [id number]  Patient Care Team: Laurann Montana, MD as PCP - General (Family Medicine) Jake Bathe, MD as PCP - Cardiology (Cardiology) Malachy Mood, MD as Consulting Physician (Medical Oncology) Dorothy Puffer, MD as Consulting Physician (Radiation Oncology) Arman Bogus, MD as Consulting Physician (Neurosurgery) Willis Modena, MD as Consulting Physician (Gastroenterology) Jamison Neighbor, MD (Urology) Konrad Penta, MD as Referring Physician (Gastroenterology) [name], MD as Consulting Physician (General Surgery)   Patient familiar to me.   History of rectal cancer status post low anterior resection 2016 complicated by postop collection that turned abscess to turn to delayed leak.  Diversion.  Persistent leak.  Omental pedicle flap.  Leak resolution radiographically and endoscopically.  Diverting loop ileostomy takedown.  Recurrence.  Eventual Hartmann resection and permanent colostomy.  History of back surgery in 2019 complicated by back abscess as well.  Paracolostomy stomal hernia status post repair with 2 layered mesh 2020 by me.    She has chronic ureteral obstructions needing ureteral stents every 3 months by Dr. Logan Bores through [facility].   She notes that she had a month where she had no vaginal rectal drainage and then had more.  She is due to get a ureteral stent switched out by Dr. Logan Bores with [facility] urology.  Defer to them.  She still gets intermittent vaginal and rectal drainage with a chronic low pelvic fluid collection that will spontaneously drain.   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    However, I have already done this with debridement & ACell in 2020 and she recurred in drainage within a few  months.  I would not offer it myself again.   She would have to refer her to a major regional/national referral center that would consider doing that.    I am hesitant to recommend just chronic antibiotics unless she has documented chronic infection from the vagina or drainage.  Urology does not put antibiotics for chronic restenting and her urinalysis has shown only mild urine contamination but no major UTI.    She has pelvic floor thickening and concern of a mass that is suspicious on PET scan but has been biopsied twice that is negative for any metastasis.  She comes in recurrent bowel obstruction.   Per her usual, she is holding off on NG tube hoping things will get better with IV fluids and nausea and pain control.    Her SBO transition zone seems to be in the left upper pelvis near the pelvic brim since the deep pelvis is filled up with the omental pedicle flap.  Could consider robotic lysis adhesions if the obstructions happen more often to see if there is some chronic transition point or area that can be trimmed or opened up.  However, I had done moderate lysis of adhesions in 2020 already.  With her numerous abdominal surgeries and chronic pelvic inflammation, risk of small bowel injury or delayed leak or fistula definitely definitely high with her numerous abdominal surgeries and obesity.  I am not excited about offering that.  Neither is she.  She seems to better & wants to advance her diet.  She needs a daily bowel regimen.   She feels like the MiraLAX daily makes her stools too watery.    Recommended  that she switch to a different fiber supplement.  Perhaps try some soluble fiber.    She is tolerating some liquids, so will try FiberCon soluble fiber twice daily and see if that will help thicken and soften things.  I would adjust as she is having 2 bowel movements a day.  Her being on chronic narcotics for severe back pain and chronic pain issues puts her more vulnerable to these  recurrent SBO obstructions.   She is hoping that she can get a spinal ablation to better control her pain and get off some of the medicines.  Since she has had some improvement with injections.  Waiting to hear from her back specialist on that.  That may help her wean off her narcotics which hopefully make her less constipated and vulnerable to recurrent SBO's.     Fortunately she has not had any mesh infection nor recurrent parastomal hernias.    Patient Active Problem List   Diagnosis Date Noted   Rectal adenocarcinoma s/p LAR resection 05/25/2015 01/31/2015    Priority: High   Pelvic mass - Bx [id number] benign (no metastasis) 04/10/2023    Priority: Medium    Chronic pain syndrome 07/24/2018    Priority: Medium    Crohn's disease (HCC) 02/07/2015    Priority: Medium    Obesity, Class III, BMI 40-49.9 (morbid obesity) (HCC) 02/21/2014    Priority: Medium    AKI (acute kidney injury) (HCC) 02/15/2023   SBO (small bowel obstruction) - recurrent 10/30/2022   Nausea and vomiting 10/27/2022   Bladder spasms 09/16/2022   [name] hematuria 09/16/2022   Hypercalcemia 07/28/2022   Leukocytosis 07/28/2022   Hyperlipidemia 07/28/2022   Seizure disorder (HCC) 07/28/2022   Nausea & vomiting 07/28/2022   Acquired hypothyroidism 02/15/2022   GERD (gastroesophageal reflux disease) 02/15/2022   Hyperglycemia 02/15/2022   Urinary incontinence, mixed 02/15/2022   Bilateral hydronephrosis 02/15/2022   Overactive bladder 01/07/2022   Mixed incontinence urge and stress 11/29/2021   Nocturia 11/29/2021   Peripheral neuropathy due to chemotherapy (HCC) 10/03/2021   Adverse effect of antineoplastic and immunosuppressive drugs, initial encounter 10/03/2021   Spondylolisthesis, lumbar region 10/03/2021   Hypophosphatemia 10/13/2020   History of small bowel obstruction 10/13/2020   Hardware complicating wound infection (HCC) 08/03/2019   Discitis, unspecified, lumbar region 07/15/2019    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   Seroma of musculoskeletal structure after non-musculoskeletal system procedure 04/24/2018   S/P lumbar spinal fusion 02/12/2018   Genetic testing 01/12/2018   Family history of pancreatic cancer    Family history of skin cancer    Family history of breast cancer    Colostomy in place (HCC) 07/12/2016   Hydroureter on right 12/20/2015   Abdominal pain 12/19/2015   Hypokalemia 11/23/2015   Anemia of chronic disease 06/14/2015   Essential hypertension    IBS (irritable bowel syndrome)    Depression    Cavernous hemangioma of liver - segments 5 & 7 02/07/2015   Unilateral primary osteoarthritis, left knee 02/07/2015   Pure hypercholesterolemia 02/21/2014   Family history of ischemic heart disease 02/21/2014    Past Medical History:  Diagnosis Date   Anxiety    Chemotherapy-induced neuropathy (HCC)    toes and fingers numbness and tingling   Colorectal delayed anastomotic leak s/p resection & colostomy 07/12/2016 11/17/2015   Colostomy in place [facility]) 07/12/2016   due to anastomosis breakdown w/ colovaginal fistula   Colovaginal fistula s/p omentopexy repair 07/12/2016 07/10/2016   Complication of  anesthesia    Depression    Family history of adverse reaction to anesthesia    mother-- ponv   Family history of breast cancer    Family history of pancreatic cancer    Family history of skin cancer    Gastroenteritis 12/20/2015   GERD (gastroesophageal reflux disease)    Hardware complicating wound infection (HCC) 08/03/2019   Hiatal hernia    History of cancer chemotherapy 02-20-2015 to 03-30-2015   History of cardiac murmur as a child    History of chemotherapy    History of chronic gastritis    History of hypertension    no issues since multiple abdminal sx's and chemo--- no medication since 12/ 2016   History of TMJ disorder    Hypercholesteremia    Hypertension    IBS (irritable bowel syndrome)    dx  age 43   Intermittent abdominal pain    post-op multiple abdominal sx's    Microcytic anemia    Mild sleep apnea    per pt study 2014  very mild osa , no cpap recommended, recommeded wt loss and sleep routine   OA (osteoarthritis)    left knee /  left shoulder   Obesity    Parastomal hernia s/p lap repair w mesh 07/23/2018 07/23/2018   Pelvic abscess s/p drainage & omental pedicle flap 11/17/2015 06/23/2015   PONV (postoperative nausea and vomiting)    severe" needs Scopolamine PATCH"    Portacath in place    right chest   Rectal adenocarcinoma (HCC) oncologis-  dr Mosetta Putt-- after radiation/ chemo (ypT1, N0) --  no recurrence per last note 03/ 2018   dx 01-13-2015-- Stage IIIC (T3, N2, M0) post concurrent radiation and chemotherapy 02-20-2015 to 03-30-2015 /  05-25-2015 s/p  LAR w/ RSO (post-op complicated by late abscess and contained anatomotic leak w/ help percutaneous drainage and antibiotics)    Rectocutaneous fistula 07/10/2016   Rotator cuff tear, left    S/P radiation therapy 02/20/15-03/30/15   colon/rectal   Vitamin D deficiency    Wears glasses    Wears glasses     Past Surgical History:  Procedure Laterality Date   ABDOMINAL HYSTERECTOMY  1996   uterus and cervix   BACK SURGERY  02/12/2018   lumbar surgery   COLON RESECTION N/A 07/12/2016   Procedure: LAPAROSCOPIC LYSIS OF ADHESIONS, OMENTOPEXY, HAND ASSISTED RESECTION OF  COLON, END TO END ANASTOMOSIS, COLOSTOMY;  Surgeon: [name], MD;  Location: [facility];  Service: General;  Laterality: N/A;   COMBINED HYSTEROSCOPY DIAGNOSTIC / D&C  x2 1990's   CYSTOSCOPY W/ URETERAL STENT PLACEMENT Bilateral 02/16/2023   Procedure: CYSTOSCOPY WITH RETROGRADE PYELOGRAM BILATERAL STENT EXCHANGE;  Surgeon: Joline Maxcy, MD;  Location: [facility];  Service: Urology;  Laterality: Bilateral;   DIAGNOSTIC LAPAROSCOPY  age 28 and age 39   EUS N/A 01/18/2015   Procedure: LOWER ENDOSCOPIC ULTRASOUND (EUS);  Surgeon: Willis Modena, MD;  Location: [facility];  Service: Endoscopy;  Laterality: N/A;   EXCISION OF SKIN TAG  11/17/2015   Procedure: EXCISION OF SKIN TAG;  Surgeon: [name], MD;  Location: [facility];  Service: General;;   ILEO LOOP COLOSTOMY CLOSURE N/A 11/17/2015   Procedure: LAPAROSCOPIC DIVERTING LOOP ILEOSTOMY  DRAINAGE OF PELVIC ABSCESS;  Surgeon: [name], MD;  Location: [facility];  Service: General;  Laterality: N/A;   ILEOSTOMY CLOSURE N/A 05/31/2016   Procedure: TAKEDOWN LOOP ILEOSTOMY;  Surgeon: [name], MD;  Location: [facility];  Service: General;  Laterality: N/A;   IMPACTION REMOVAL  11/17/2015   Procedure: DISIMPACTION REMOVAL;  Surgeon: [name], MD;  Location: [facility];  Service: General;;   INSERTION OF MESH N/A 07/23/2018   Procedure: INSERTION OF MESH X2;  Surgeon: [name], MD;  Location: [facility];  Service: General;  Laterality: N/A;   IR LUMBAR DISC ASPIRATION W/IMG GUIDE  07/01/2019   KNEE ARTHROSCOPY Left 1990's   KNEE ARTHROSCOPY W/ MENISCECTOMY Left 09/14/2009   and chondroplasty debridement   LAPAROSCOPIC LYSIS OF ADHESIONS  11/17/2015   Procedure: LAPAROSCOPIC LYSIS OF ADHESIONS;  Surgeon: [name], MD;  Location: [facility];  Service: General;;   LUMBAR WOUND DEBRIDEMENT N/A 04/24/2018   Procedure: wound exploration, irrigation and debridement;  Surgeon: Tia Alert, MD;  Location: [facility] OR;  Service: Neurosurgery;  Laterality: N/A;  wound exploration, irrigation and debridement   LYSIS OF ADHESION N/A 07/23/2018   Procedure: LYSIS OF ADHESIONS;  Surgeon: [name], MD;  Location: [facility];  Service: General;  Laterality: N/A;   PORT-A-CATH REMOVAL N/A 11/21/2016   Procedure: REMOVAL PORT-A-CATH;  Surgeon: [name], MD;  Location: [facility];  Service: General;  Laterality: N/A;   PORTACATH PLACEMENT N/A 05/25/2015   Procedure: INSERTION PORT-A-CATH;  Surgeon: [name], MD;  Location: [facility];  Service: General;  Laterality: N/A;-remains inplace Right chest.   ROTATOR CUFF  REPAIR Right 2006   TONSILLECTOMY  age 13   VENTRAL HERNIA REPAIR N/A 07/23/2018   Procedure: LAPAROSCOPIC VENTRAL WALL HERNIA REPAIR;  Surgeon: [name], MD;  Location: [facility];  Service: General;  Laterality: N/A;   XI ROBOTIC ASSISTED LOWER ANTERIOR RESECTION N/A 05/25/2015   Procedure: XI ROBOTIC ASSISTED LOWER ANTERIOR RESECTION, , RIGID PROCTOSCOPY, RIGHT OOPHORECTOMY;  Surgeon: [name], MD;  Location: [facility];  Service: General;  Laterality: N/A;    Social History   Socioeconomic History   Marital status: Married    Spouse name: Not on file   Number of children: Not on file   Years of education: Not on file   Highest education level: Not on file  Occupational History   Not on file  Tobacco Use   Smoking status: Former    Current packs/day: 0.00    Average packs/day: 0.3 packs/day for 7.0 years (1.8 ttl pk-yrs)    Types: Cigarettes    Start date: 07/15/1984    Quit date: 07/16/1991    Years since quitting: 31.7   Smokeless tobacco: Never  Vaping Use   Vaping status: Never Used  Substance and Sexual Activity   Alcohol use: No   Drug use: Never   Sexual activity: Yes    Birth control/protection: Surgical  Other Topics Concern   Not on file  Social History Narrative   Tobacco Use: Cigarettes - Former Smoker   Alcohol: Yes, very rare, liquor.    No recreational drug use   Occupation: Head CMA @ [employer]   Marital Status: Married    Husband: Asher Muir Disabled   Children: 2 adopted kids Maggie & Reuel Boom   Religion: [employer] [place]               Social Determinants of Health   Financial Resource Strain: High Risk (10/04/2022)   Received from [employer], [facility]   Overall Financial Resource Strain (CARDIA)    Difficulty of Paying Living Expenses: Very hard  Food Insecurity: No Food Insecurity (04/07/2023)   Hunger Vital Sign    Worried About Running Out  of Food in the Last Year: Never true    Ran Out of Food in the Last Year: Never true   Transportation Needs: No Transportation Needs (04/07/2023)   PRAPARE - Administrator, Civil Service (Medical): No    Lack of Transportation (Non-Medical): No  Physical Activity: Inactive (10/04/2022)   [facility], [facility]   Exercise Vital Sign    Days of Exercise per Week: 0 days    Minutes of Exercise per Session: 0 min  Stress: Stress Concern Present (10/04/2022)   Received from [facility], [facility] - Occupational Stress Questionnaire    Feeling of Stress : Rather much  Social Connections: Moderately Integrated (10/04/2022)   Received from [facility], [facility]   Social Network    How would you rate your social network (family, work, friends)?: Adequate participation with social networks  Recent Concern: Social Connections - Somewhat Isolated (08/27/2022)   [facility]   Social Network    How would you rate your social network (family, work, friends)?: Restricted participation with some degree of social isolation  Intimate Partner Violence: Not At Risk (04/07/2023)   Humiliation, Afraid, Rape, and Kick questionnaire    Fear of Current or Ex-Partner: No    Emotionally Abused: No    Physically Abused: No    Sexually Abused: No    Family History  Problem Relation Age of Onset   Coronary artery disease Mother 35   Hypertension Mother    Hyperlipidemia Mother    Diabetes Mellitus I Mother    Coronary artery disease Father    Hyperlipidemia Father    Hypertension Father    Cancer Sister        skin - non melanoma   Hyperlipidemia Brother    Cancer Maternal Uncle 56       pancreatic with mets to colon and prostate   Cirrhosis Maternal Uncle    Hypertension Maternal Grandmother    Diabetes Mellitus I Maternal Grandmother    Hyperlipidemia Maternal Grandmother    CVA Maternal Grandmother    Hypertension Maternal Grandfather    Coronary artery disease Maternal Grandfather  45   Hyperlipidemia Maternal Grandfather    Coronary artery disease Paternal Grandmother    Hypertension Paternal Grandmother    Hyperlipidemia Paternal Grandmother    Diabetes Mellitus I Paternal Grandmother    Hypertension Paternal Grandfather    Hyperlipidemia Paternal Grandfather    Coronary artery disease Paternal Grandfather     Current Facility-Administered Medications  Medication Dose Route Frequency Provider Last Rate Last Admin   acetaminophen (TYLENOL) tablet 650 mg  650 mg Oral Q6H PRN Howerter, Justin B, DO   650 mg at 04/10/23 0703   Or   acetaminophen (TYLENOL) suppository 650 mg  650 mg Rectal Q6H PRN Howerter, Justin B, DO       alum & mag hydroxide-simeth (MAALOX/MYLANTA) 200-200-20 MG/5ML suspension 30 mL  30 mL Oral Q6H PRN [name], MD       bisoprolol (ZEBETA) tablet 5 mg  5 mg Oral Daily Marinda Elk, MD   5 mg at 04/10/23 1016   DULoxetine (CYMBALTA) DR capsule 120 mg  120 mg Oral QPM Marinda Elk, MD   120 mg at 04/09/23 1231   famotidine (PEPCID) IVPB 20 mg premix  20 mg Intravenous Q12H Bobette Mo, MD 100 mL/hr at 04/10/23 0504 20 mg at 04/10/23 0504   hydrALAZINE (APRESOLINE)  injection 10 mg  10 mg Intravenous Q4H PRN Bobette Mo, MD       HYDROmorphone (DILAUDID) injection 1 mg  1 mg Intravenous Q3H PRN Marinda Elk, MD   1 mg at 04/10/23 0455   HYDROmorphone (DILAUDID) tablet 4 mg  4 mg Oral Q6H PRN Marinda Elk, MD   4 mg at 04/10/23 0747   hyoscyamine (LEVSIN SL) SL tablet 0.125 mg  0.125 mg Oral TID PRN Otho Bellows, RPH       lactated ringers bolus 1,000 mL  1,000 mL Intravenous Q8H PRN [name], MD       levETIRAcetam (KEPPRA) tablet 500 mg  500 mg Oral BID Marinda Elk, MD   500 mg at 04/10/23 1016   levothyroxine (SYNTHROID) tablet 50 mcg  50 mcg Oral Q0600 Marinda Elk, MD   50 mcg at 04/10/23 2952   magic mouthwash  15 mL Oral QID PRN [name], MD       melatonin tablet  3-6 mg  3-6 mg Oral QHS PRN Marinda Elk, MD   6 mg at 04/09/23 2115   menthol-cetylpyridinium (CEPACOL) lozenge 3 mg  1 lozenge Oral PRN [name], MD       morphine (MS CONTIN) 12 hr tablet 15 mg  15 mg Oral Q12H Marinda Elk, MD   15 mg at 04/10/23 1016   naloxone (NARCAN) injection 0.4 mg  0.4 mg Intravenous PRN Howerter, Justin B, DO       ondansetron (ZOFRAN) injection 4 mg  4 mg Intravenous Q6H PRN [name], MD       Or   ondansetron (ZOFRAN) 8 mg in sodium chloride 0.9 % 50 mL IVPB  8 mg Intravenous Q6H PRN [name], MD       oxybutynin (DITROPAN-XL) 24 hr tablet 15 mg  15 mg Oral Daily Marinda Elk, MD   15 mg at 04/10/23 1015   pantoprazole (PROTONIX) EC tablet 40 mg  40 mg Oral Daily Marinda Elk, MD   40 mg at 04/10/23 1016   phenol (CHLORASEPTIC) mouth spray 2 spray  2 spray Mouth/Throat PRN [name], MD       phosphorus (K PHOS NEUTRAL) tablet 500 mg  500 mg Oral BID Marinda Elk, MD   500 mg at 04/10/23 1016   polycarbophil (FIBERCON) tablet 625 mg  625 mg Oral BID [name], MD       pregabalin (LYRICA) capsule 225 mg  225 mg Oral BID Marinda Elk, MD   225 mg at 04/10/23 1016   prochlorperazine (COMPAZINE) injection 5-10 mg  5-10 mg Intravenous Q4H PRN [name], MD       rosuvastatin (CRESTOR) tablet 5 mg  5 mg Oral QHS Marinda Elk, MD   5 mg at 04/09/23 2116   simethicone (MYLICON) 40 MG/0.6ML suspension 80 mg  80 mg Oral QID PRN [name], MD       Facility-Administered Medications Ordered in Other Encounters  Medication Dose Route Frequency Provider Last Rate Last Admin   0.9 %  sodium chloride infusion   Intravenous Once Malachy Mood, MD         Allergies  Allergen Reactions   Caine-1 [Lidocaine] Swelling and Rash    Eyes swell shut; includes all caine drugs except marcaine. EMLA cream OK though    Bupropion Other (See Comments)    Not effective per Pt.    Sulfa Antibiotics Nausea  And Vomiting and Rash   Pantoprazole Other (See Comments)    Not effective per Pt.    Adhesive [Tape] Rash and Other (See Comments)    Blisters - can use paper tape   Doxycycline Nausea Only   Flagyl [Metronidazole] Nausea Only   Iron Nausea Only   Metoprolol Nausea Only and Palpitations   Oxycodone Other (See Comments)    NIGHTMARES. (tolerates hydrocodone or tramadol better)   Penicillins Nausea Only and Rash    BP 139/80 (BP Location: Right Arm)   Pulse 61   Temp 98.2 F (36.8 C)   Resp 17   Wt (!) 138.3 kg   SpO2 97%   BMI 42.52 kg/m   DG Abd Portable 1V  Result Date: 04/09/2023 CLINICAL DATA:  Small bowel obstruction. EXAM: PORTABLE ABDOMEN - 1 VIEW COMPARISON:  Radiograph yesterday, additional priors.  CT 04/07/2023 FINDINGS: Diminishing gaseous small bowel distension centrally. Decreasing gaseous distention of colon, small volume of formed colonic stool. Bilateral nephroureteral stents remain in place. IMPRESSION: Diminishing gaseous small bowel distension centrally, radiographic features of improving obstruction. Electronically Signed   By: Narda Rutherford M.D.   On: 04/09/2023 13:17   DG Abd Portable 1V  Result Date: 04/08/2023 CLINICAL DATA:  Small bowel obstruction. EXAM: PORTABLE ABDOMEN - 1 VIEW COMPARISON:  Radiographs and CT yesterday FINDINGS: Persistent gaseous small bowel distension centrally, up to 5 cm. Bilateral nephroureteral stents in place. Clearing of retained contrast in the renal collecting systems. IMPRESSION: Persistent gaseous small bowel distension centrally, up to 5 cm, consistent with small bowel obstruction. Electronically Signed   By: Narda Rutherford M.D.   On: 04/08/2023 10:27   DG Abd Portable 1V  Result Date: 04/07/2023 CLINICAL DATA:  Small-bowel obstruction EXAM: PORTABLE ABDOMEN - 1 VIEW COMPARISON:  10/30/2022; CT abdomen and pelvis-03/18/2023 FINDINGS: There is moderate-to-marked gaseous distention of multiple loops small bowel with index  loop of small bowel measuring 4.9 cm in diameter with apparent mild bowel wall thickening. A small amount of distal colonic gas is seen within the ascending colon. Nondiagnostic evaluation for pneumoperitoneum secondary to supine positioning and exclusion of the lower thorax. No definite pneumatosis or portal venous gas Post bilateral ureteral stent placement. Excreted contrast is seen within the bilateral renal collecting system, extending to the level of the urinary bladder. Mild right-sided pelvicaliectasis. Phleboliths overlie the lower pelvis bilaterally. Moderate degenerative change of the bilateral hips with joint space loss, subchondral sclerosis and osteophytosis. IMPRESSION: 1. Findings compatible with known small bowel obstruction with associated small bowel wall thickening as demonstrated on preceding abdominal CT. 2. Post bilateral ureteral stent placement with mild right-sided pelvicaliectasis. Electronically Signed   By: Simonne Come M.D.   On: 04/07/2023 13:31   CT ABDOMEN PELVIS W CONTRAST  Result Date: 04/07/2023 CLINICAL DATA:  Evaluate for bowel obstruction. Complains of vomiting, upper abdominal pain and diarrhea. History of rectal cancer, status post low anterior resection. * Tracking Code: BO * EXAM: CT ABDOMEN AND PELVIS WITH CONTRAST TECHNIQUE: Multidetector CT imaging of the abdomen and pelvis was performed using the standard protocol following bolus administration of intravenous contrast. RADIATION DOSE REDUCTION: This exam was performed according to the departmental dose-optimization program which includes automated exposure control, adjustment of the mA and/or kV according to patient size and/or use of iterative reconstruction technique. CONTRAST:  OMNIPAQUE IOHEXOL 300 MG/ML  SOLN COMPARISON:  02/23/2023 FINDINGS: Lower chest: No acute abnormality. Hepatobiliary: No focal liver abnormality is seen. No gallstones, gallbladder wall thickening, or  biliary dilatation. Pancreas:  Unremarkable. No pancreatic ductal dilatation or surrounding inflammatory changes. Spleen: Normal in size without focal abnormality. Adrenals/Urinary Tract: Normal adrenal glands. Mild bilateral renal cortical scarring. Bilateral nephroureteral stents are in place. Interval improvement in right-sided hydronephrosis. Scratch there is mild right-sided hydronephrosis, improved from previous exam. Stable left pelvocaliectasis. No focal bladder abnormality. Stomach/Bowel: Postoperative changes from low anterior resection with left abdominal colostomy. Fluid distension of the stomach. Increase caliber of the proximal small bowel loops with air-fluid levels. Small bowel loops now measure up to 4.9 cm, image 48/2. Transition to decreased caliber distal small bowel is identified within the lower abdomen, image 69/2. The colon is normal in caliber of to the colostomy site. Vascular/Lymphatic: Aortic atherosclerosis. Mild right lower quadrant mesenteric adenopathy. Index lymph node measures 1 cm, image 67/2. Previously this measured the same. No additional adenopathy identified. Reproductive: Status post hysterectomy. No adnexal masses. Other: Post treatment changes identified within the presacral fat region. Increased soft tissue in this area measures 9.1 x 5.8 cm, image 83/2. This appears unchanged in size from previous exam. Central focus of fluid and gas within this area measures 4.6 x 1.7 cm, image 85/2. Previously 5.9 x 2.8 cm previously. Musculoskeletal: Postoperative changes identified within the lower lumbar spine. No acute or suspicious osseous bone lesions. IMPRESSION: 1. Postoperative changes from low anterior resection with left abdominal colostomy. 2. Increase caliber of the proximal small bowel loops with air-fluid levels. Transition to decreased caliber distal small bowel is identified within the lower abdomen. Findings are consistent with small bowel obstruction. 3. Post treatment changes identified within the  presacral fat region. Within this area, there is a fluid and gas collection which has decreased in size from previous exam. 4. Bilateral nephroureteral stents are in place. Interval improvement in right-sided hydronephrosis. 5. Aortic Atherosclerosis (ICD10-I70.0). Electronically Signed   By: Signa Kell M.D.   On: 04/07/2023 05:31    Note:  This dictation was prepared with Dragon/digital dictation along with [employer]. Any transcriptional errors that result from this process are unintentional.    Ardeth Sportsman, MD, FACS, MASCRS Esophageal, Gastrointestinal & Colorectal Surgery Robotic and Minimally Invasive Surgery  [facility] [address] [place], [place] [address] [phone] Fax [phone] Main  CONTACT INFORMATION: Weekday (9AM-5PM): Call [facility] main office at [phone] Weeknight (5PM-9AM) or Weekend/Holiday: Check EPIC "Web Links" tab & use "AMION" (password " [email or web address]") for General Surgery [facility] coverage  Please, DO NOT use SecureChat  (it is not reliable communication to reach operating surgeons & will lead to a delay in care).   Epic staff messaging available for outptient concerns needing 1-2 business day response.      04/10/2023  11:03 AM

## 2023-04-10 NOTE — Progress Notes (Signed)
   Subjective/Chief Complaint: Still having occasional cramping abdominal pain but did have a little bit out of her ostomy. Tolerated clears with no nausea or vomiting   Objective: Vital signs in last 24 hours: Temp:  [98 F (36.7 C)-98.2 F (36.8 C)] 98.2 F (36.8 C) (09/26 0602) Pulse Rate:  [53-70] 61 (09/26 0602) Resp:  [17-20] 17 (09/26 0602) BP: (120-148)/(71-100) 139/80 (09/26 0602) SpO2:  [97 %-100 %] 97 % (09/26 0602) Last BM Date : 04/09/23  Intake/Output from previous day: 09/25 0701 - 09/26 0700 In: -  Out: 2200 [Urine:2200] Intake/Output this shift: No intake/output data recorded.  Exam; Awake and alert Looks comfortable Abdomen soft, obese, no frank peritonitis  Lab Results:  Recent Labs    04/08/23 1435  WBC 7.5  HGB 10.3*  HCT 34.2*  PLT 287   BMET Recent Labs    04/08/23 1435  NA 138  K 4.3  CL 108  CO2 23  GLUCOSE 117*  BUN 7  CREATININE 0.55  CALCIUM 8.6*   PT/INR No results for input(s): "LABPROT", "INR" in the last 72 hours. ABG No results for input(s): "PHART", "HCO3" in the last 72 hours.  Invalid input(s): "PCO2", "PO2"  Studies/Results: DG Abd Portable 1V  Result Date: 04/09/2023 CLINICAL DATA:  Small bowel obstruction. EXAM: PORTABLE ABDOMEN - 1 VIEW COMPARISON:  Radiograph yesterday, additional priors.  CT 04/07/2023 FINDINGS: Diminishing gaseous small bowel distension centrally. Decreasing gaseous distention of colon, small volume of formed colonic stool. Bilateral nephroureteral stents remain in place. IMPRESSION: Diminishing gaseous small bowel distension centrally, radiographic features of improving obstruction. Electronically Signed   By: Narda Rutherford M.D.   On: 04/09/2023 13:17    Anti-infectives: Anti-infectives (From admission, onward)    None       Assessment/Plan: SBO - CT w/ transition within the lower abdomen concerning for SBO. Complex surgical hx. Hx of SBO in 10/2020 and 02/2023 that resolved with  conservative management.   Again, she refused and NG or try the small bowel protocol. She would like to try full liquids.  If she fails, we would at least recommend a trial of NG suctioning. Given her prior surgical procedures, mesh, etc, surgery could be very difficult  LOS: 3 days    Abigail Miyamoto MD 04/10/2023

## 2023-04-11 DIAGNOSIS — K56609 Unspecified intestinal obstruction, unspecified as to partial versus complete obstruction: Secondary | ICD-10-CM | POA: Diagnosis not present

## 2023-04-11 DIAGNOSIS — I10 Essential (primary) hypertension: Secondary | ICD-10-CM | POA: Diagnosis not present

## 2023-04-11 DIAGNOSIS — R739 Hyperglycemia, unspecified: Secondary | ICD-10-CM | POA: Diagnosis not present

## 2023-04-11 DIAGNOSIS — E039 Hypothyroidism, unspecified: Secondary | ICD-10-CM | POA: Diagnosis not present

## 2023-04-11 MED ORDER — SENNOSIDES-DOCUSATE SODIUM 8.6-50 MG PO TABS
1.0000 | ORAL_TABLET | Freq: Every day | ORAL | Status: DC
  Administered 2023-04-11 – 2023-04-12 (×2): 1 via ORAL
  Filled 2023-04-11 (×2): qty 1

## 2023-04-11 MED ORDER — FAMOTIDINE 20 MG PO TABS
20.0000 mg | ORAL_TABLET | Freq: Two times a day (BID) | ORAL | Status: DC
  Administered 2023-04-11 – 2023-04-13 (×5): 20 mg via ORAL
  Filled 2023-04-11 (×5): qty 1

## 2023-04-11 MED ORDER — HYDROMORPHONE HCL 1 MG/ML IJ SOLN
1.0000 mg | INTRAMUSCULAR | Status: AC | PRN
  Administered 2023-04-11: 1 mg via INTRAVENOUS
  Filled 2023-04-11: qty 1

## 2023-04-11 MED ORDER — HYDROMORPHONE HCL 2 MG PO TABS
4.0000 mg | ORAL_TABLET | ORAL | Status: DC | PRN
  Administered 2023-04-11 – 2023-04-13 (×14): 4 mg via ORAL
  Filled 2023-04-11 (×14): qty 2

## 2023-04-11 NOTE — Plan of Care (Signed)

## 2023-04-11 NOTE — Progress Notes (Signed)
[facility] PROGRESS NOTE    Progress Note  Lindsay Little  EXB:[id number] DOB: [birth year] DOA: 04/07/2023 PCP: Laurann Montana, MD     Brief Narrative:   Lindsay Little is an 57 y.o. female past medical history of anxiety chemotherapy-induced neuropathy, inflammatory bowel disease, history of rectal cancer status post surgical intervention with a diverting colostomy comes in with abdominal pain nausea and vomiting and decreased colostomy output, she denies any melanotic stools, CT scan of the abdomen and pelvis with suspicion for small bowel obstruction, was fluid resuscitated in the ED was giving IV magnesium, Reglan and narcotics, NG tube was never placed, KUB was repeated that showed persistent small bowel obstruction, general surgery was consulted who recommended to continue conservative management and start on a clear liquid diet   Assessment/Plan:   SBO (small bowel obstruction) (HCC) Appreciated on CT scan. With multiple small bowel obstruction . General surgery was consulted recommended conservative management. Try to keep potassium greater than 4 magnesium greater than 2 ambulate as tolerated. Tolerated her diet surgery recommended soft diet.  Currently on Zofran and prochlorperazine  for nausea and vomiting. Relates her pain is not controlled today. Minimal output through stoma. For management per surgery.  Essential hypertension: Continue bisoprolol  Hypothyroidism: Continue Synthroid  Hyperglycemia: Likely reactive continue sliding scale insulin.  Peripheral neuropathy: Resume Lyrica.  Chronic pain syndrome: She does not have her home medication. She was started on MS Contin and oral Dilaudid. Her pain is not controlled, will decrease interval of oral Dilaudid.  Hyperlipidemia: Resume statins.  Seizure disorder: Resume Keppra.  Thrombocytosis: Likely reactive.  Now resolved.  GERD: Changed to Protonix daily.  Morbid obesity: Noted she  has been counseled.  DVT prophylaxis: lovenox Family Communication:none Status is: Inpatient Small bowel obstruction    Code Status:     Code Status Orders  (From admission, onward)           Start     Ordered   04/07/23 0556  Full code  Continuous       Question:  By:  Answer:  Consent: discussion documented in EHR   04/07/23 0555           Code Status History     Date Active Date Inactive Code Status Order ID Comments User Context   02/15/2023 0846 02/18/2023 1550 Full Code [id number]  Maryln Gottron, MD ED   10/27/2022 0758 10/31/2022 1624 Full Code [id number]  Maryln Gottron, MD Inpatient   07/28/2022 0049 08/03/2022 1559 Full Code [id number]  Howerter, Chaney Born, DO Inpatient   07/15/2022 0832 07/20/2022 1804 Full Code [id number]  Bobette Mo, MD ED   02/15/2022 1429 02/18/2022 2022 Full Code [id number]  Teddy Spike, DO Inpatient   10/13/2020 0020 10/17/2020 1725 Full Code [id number]  Bobette Mo, MD Inpatient   07/15/2019 0816 07/21/2019 2107 Full Code [id number]  Clydie Braun, MD ED   07/23/2018 1506 07/26/2018 1324 Full Code [id number]  [name], MD Inpatient   04/24/2018 1057 04/24/2018 2056 Full Code [id number]  Tia Alert, MD Inpatient   02/12/2018 1314 02/13/2018 2126 Full Code [id number]  Tia Alert, MD Inpatient   07/10/2016 1848 07/17/2016 1638 Full Code [id number]  [name], MD Inpatient   07/10/2016 1522 07/10/2016 1812 Full Code [id number]  [name], MD Outpatient   05/31/2016 1138 06/03/2016 1834 Full Code [id number]  [name], MD Inpatient   12/20/2015 0727 12/22/2015 1208 Full Code [id number]  [name], MD Inpatient  12/19/2015 2006 12/20/2015 0727 Full Code [id number]  Glenna Fellows, MD Inpatient   12/05/2015 1514 12/07/2015 1935 Full Code [id number]  Duncan Dull ED   09/15/2015 1720 09/17/2015 1510 Full Code [id number]  [name], MD Inpatient   06/23/2015 1611 06/29/2015 1355 Full Code [id number]  [name], MD  Inpatient   05/25/2015 1609 05/29/2015 1313 Full Code [id number]  [name], MD Inpatient         IV Access:   Peripheral IV   Procedures and diagnostic studies:   DG Abd Portable 1V  Result Date: 04/09/2023 CLINICAL DATA:  Small bowel obstruction. EXAM: PORTABLE ABDOMEN - 1 VIEW COMPARISON:  Radiograph yesterday, additional priors.  CT 04/07/2023 FINDINGS: Diminishing gaseous small bowel distension centrally. Decreasing gaseous distention of colon, small volume of formed colonic stool. Bilateral nephroureteral stents remain in place. IMPRESSION: Diminishing gaseous small bowel distension centrally, radiographic features of improving obstruction. Electronically Signed   By: Narda Rutherford M.D.   On: 04/09/2023 13:17     Medical Consultants:   None.   Subjective:    Lindsay Little tolerated her diet advancing to a soft.  Relates her pain is not controlled.  Objective:    Vitals:   04/10/23 0602 04/10/23 1241 04/10/23 2213 04/11/23 0655  BP: 139/80 131/78 120/82 127/76  Pulse: 61 (!) 55 60 (!) 56  Resp: 17 16 18 18   Temp: 98.2 F (36.8 C) 98.4 F (36.9 C) 97.6 F (36.4 C) 97.7 F (36.5 C)  TempSrc:   Oral Oral  SpO2: 97% 97% 96% 91%  Weight:       SpO2: 91 %   Intake/Output Summary (Last 24 hours) at 04/11/2023 0937 Last data filed at 04/11/2023 0322 Gross per 24 hour  Intake 1440 ml  Output 1600 ml  Net -160 ml   Filed Weights   04/09/23 0500  Weight: (!) 138.3 kg    Exam: General exam: In no acute distress. Respiratory system: Good air movement and clear to auscultation. Cardiovascular system: S1 & S2 heard, RRR. No JVD. Gastrointestinal system: Abdomen is nondistended, soft and nontender.  Extremities: No pedal edema. Skin: No rashes, lesions or ulcers Psychiatry: Judgement and insight appear normal. Mood & affect appropriate. Data Reviewed:    Labs: Basic Metabolic Panel: Recent Labs  Lab 04/07/23 0410 04/08/23 1435 04/10/23 0934   NA 137 138 136  K 3.6 4.3 3.7  CL 101 108 101  CO2 24 23 25   GLUCOSE 154* 117* 138*  BUN 14 7 7   CREATININE 0.67 0.55 0.86  CALCIUM 10.1 8.6* 9.4  MG 1.9 2.0  --   PHOS  --  2.0* 4.0   GFR Estimated Creatinine Clearance: 112.8 mL/min (by C-G formula based on SCr of 0.86 mg/dL). Liver Function Tests: Recent Labs  Lab 04/07/23 0410 04/08/23 1435 04/10/23 0934  AST 17 19  --   ALT 16 12  --   ALKPHOS 73 56  --   BILITOT 0.5 0.8  --   PROT 9.3* 7.2  --   ALBUMIN 3.8 3.1* 3.5   Recent Labs  Lab 04/07/23 0410  LIPASE 20   No results for input(s): "AMMONIA" in the last 168 hours. Coagulation profile No results for input(s): "INR", "PROTIME" in the last 168 hours. COVID-19 Labs  No results for input(s): "DDIMER", "FERRITIN", "LDH", "CRP" in the last 72 hours.  Lab Results  Component Value Date   SARSCOV2NAA NEGATIVE 10/12/2020   SARSCOV2NAA NEGATIVE 07/15/2019    CBC: Recent  Labs  Lab 04/07/23 0410 04/08/23 1435  WBC 11.3* 7.5  NEUTROABS 9.3* 5.0  HGB 13.8 10.3*  HCT 45.0 34.2*  MCV 82.4 84.9  PLT 477* 287   Cardiac Enzymes: No results for input(s): "CKTOTAL", "CKMB", "CKMBINDEX", "TROPONINI" in the last 168 hours. BNP (last 3 results) No results for input(s): "PROBNP" in the last 8760 hours. CBG: No results for input(s): "GLUCAP" in the last 168 hours. D-Dimer: No results for input(s): "DDIMER" in the last 72 hours. Hgb A1c: No results for input(s): "HGBA1C" in the last 72 hours. Lipid Profile: No results for input(s): "CHOL", "HDL", "LDLCALC", "TRIG", "CHOLHDL", "LDLDIRECT" in the last 72 hours. Thyroid function studies: No results for input(s): "TSH", "T4TOTAL", "T3FREE", "THYROIDAB" in the last 72 hours.  Invalid input(s): "FREET3" Anemia work up: No results for input(s): "VITAMINB12", "FOLATE", "FERRITIN", "TIBC", "IRON", "RETICCTPCT" in the last 72 hours. Sepsis Labs: Recent Labs  Lab 04/07/23 0410 04/08/23 1435  WBC 11.3* 7.5    Microbiology No results found for this or any previous visit (from the past 240 hour(s)).   Medications:    bisoprolol  5 mg Oral Daily   DULoxetine  120 mg Oral QPM   famotidine  20 mg Oral BID   levETIRAcetam  500 mg Oral BID   levothyroxine  50 mcg Oral Q0600   morphine  15 mg Oral Q12H   oxybutynin  15 mg Oral Daily   pantoprazole  40 mg Oral Daily   polycarbophil  625 mg Oral BID   pregabalin  225 mg Oral BID   rosuvastatin  5 mg Oral QHS   Continuous Infusions:  lactated ringers     ondansetron (ZOFRAN) IV        LOS: 4 days   Marinda Elk  [facility]  04/11/2023, 9:37 AM

## 2023-04-11 NOTE — Progress Notes (Signed)
   Subjective/Chief Complaint: She feels about the same from a pain standpoint but no nausea and she tolerated fulls. She wants to try a soft diet   Objective: Vital signs in last 24 hours: Temp:  [97.6 F (36.4 C)-98.4 F (36.9 C)] 97.7 F (36.5 C) (09/27 0655) Pulse Rate:  [55-60] 56 (09/27 0655) Resp:  [16-18] 18 (09/27 0655) BP: (120-131)/(76-82) 127/76 (09/27 0655) SpO2:  [91 %-97 %] 91 % (09/27 0655) Last BM Date : 04/10/23  Intake/Output from previous day: 09/26 0701 - 09/27 0700 In: 1560 [P.O.:1560] Out: 1600 [Urine:1600] Intake/Output this shift: No intake/output data recorded.  Exam: Awake and alert Looks comfortable Abdomen obese, little in the ostomy bag Fairly non-tender abdomen  Lab Results:  Recent Labs    04/08/23 1435  WBC 7.5  HGB 10.3*  HCT 34.2*  PLT 287   BMET Recent Labs    04/08/23 1435 04/10/23 0934  NA 138 136  K 4.3 3.7  CL 108 101  CO2 23 25  GLUCOSE 117* 138*  BUN 7 7  CREATININE 0.55 0.86  CALCIUM 8.6* 9.4   PT/INR No results for input(s): "LABPROT", "INR" in the last 72 hours. ABG No results for input(s): "PHART", "HCO3" in the last 72 hours.  Invalid input(s): "PCO2", "PO2"  Studies/Results: DG Abd Portable 1V  Result Date: 04/09/2023 CLINICAL DATA:  Small bowel obstruction. EXAM: PORTABLE ABDOMEN - 1 VIEW COMPARISON:  Radiograph yesterday, additional priors.  CT 04/07/2023 FINDINGS: Diminishing gaseous small bowel distension centrally. Decreasing gaseous distention of colon, small volume of formed colonic stool. Bilateral nephroureteral stents remain in place. IMPRESSION: Diminishing gaseous small bowel distension centrally, radiographic features of improving obstruction. Electronically Signed   By: Narda Rutherford M.D.   On: 04/09/2023 13:17    Anti-infectives: Anti-infectives (From admission, onward)    None       Assessment/Plan: SBO  Please see note from Dr. Michaell Cowing from yesterday as he knows her well  regarding her care, extensive history  Will try a full liquid diet today    Abigail Miyamoto MD 04/11/2023

## 2023-04-12 DIAGNOSIS — R739 Hyperglycemia, unspecified: Secondary | ICD-10-CM | POA: Diagnosis not present

## 2023-04-12 DIAGNOSIS — I10 Essential (primary) hypertension: Secondary | ICD-10-CM | POA: Diagnosis not present

## 2023-04-12 DIAGNOSIS — K56609 Unspecified intestinal obstruction, unspecified as to partial versus complete obstruction: Secondary | ICD-10-CM | POA: Diagnosis not present

## 2023-04-12 DIAGNOSIS — E039 Hypothyroidism, unspecified: Secondary | ICD-10-CM | POA: Diagnosis not present

## 2023-04-12 NOTE — Progress Notes (Signed)
Subjective/Chief Complaint: No complaints. Tolerating diet and passing flatus   Objective: Vital signs in last 24 hours: Temp:  [97.5 F (36.4 C)-97.9 F (36.6 C)] 97.8 F (36.6 C) (09/28 0519) Pulse Rate:  [64-70] 65 (09/28 0519) Resp:  [17-20] 17 (09/28 0519) BP: (120-125)/(53-73) 125/65 (09/28 0519) SpO2:  [89 %-94 %] 92 % (09/28 0519) Weight:  [138.8 kg] 138.8 kg (09/28 0500) Last BM Date : 04/10/23  Intake/Output from previous day: 09/27 0701 - 09/28 0700 In: 240 [P.O.:240] Out: 1500 [Urine:1500] Intake/Output this shift: No intake/output data recorded.  General appearance: alert and cooperative Resp: clear to auscultation bilaterally Cardio: regular rate and rhythm GI: soft, nontender. Ostomy has had some output  Lab Results:  No results for input(s): "WBC", "HGB", "HCT", "PLT" in the last 72 hours. BMET Recent Labs    04/10/23 0934  NA 136  K 3.7  CL 101  CO2 25  GLUCOSE 138*  BUN 7  CREATININE 0.86  CALCIUM 9.4   PT/INR No results for input(s): "LABPROT", "INR" in the last 72 hours. ABG No results for input(s): "PHART", "HCO3" in the last 72 hours.  Invalid input(s): "PCO2", "PO2"  Studies/Results: No results found.  Anti-infectives: Anti-infectives (From admission, onward)    None       Assessment/Plan: s/p * No surgery found * Advance diet. On soft foods now No indication for surgery at this point History of rectal cancer status post low anterior resection 2016 complicated by postop collection that turned abscess to turn to delayed leak.  Diversion.  Persistent leak.  Omental pedicle flap.  Leak resolution radiographically and endoscopically.  Diverting loop ileostomy takedown.  Recurrence.  Eventual Hartmann resection and permanent colostomy.  History of back surgery in 2019 complicated by back abscess as well.  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     She has chronic ureteral obstructions  needing ureteral stents every 3 months by Dr. Logan Bores through [facility].   She notes that she had a month where she had no vaginal rectal drainage and then had more.  She is due to get a ureteral stent switched out by Dr. Logan Bores with [facility] urology.  Defer to them.   She still gets intermittent vaginal and rectal drainage with a chronic low pelvic fluid collection that will spontaneously drain.   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     However, I have already done this with debridement & ACell in 2020 and she recurred in drainage within a few months.  I would not offer it myself again.    She would have to refer her to a major regional/national referral center that would consider doing that.     I am hesitant to recommend just chronic antibiotics unless she has documented chronic infection from the vagina or drainage.  Urology does not put antibiotics for chronic restenting and her urinalysis has shown only mild urine contamination but no major UTI.     She has pelvic floor thickening and concern of a mass that is suspicious on PET scan but has been biopsied twice that is negative for any metastasis.   She comes in recurrent bowel obstruction.   Per her usual, she is holding off on NG tube hoping things  will get better with IV fluids and nausea and pain control.     Her SBO transition zone seems to be in the left upper pelvis near the pelvic brim since the deep pelvis is filled up with the omental pedicle flap.  Could consider robotic lysis adhesions if the obstructions happen more often to see if there is some chronic transition point or area that can be trimmed or opened up.  However, I had done moderate lysis of adhesions in 2020 already.  With her numerous  abdominal surgeries and chronic pelvic inflammation, risk of small bowel injury or delayed leak or fistula definitely definitely high with her numerous abdominal surgeries and obesity.  I am not excited about offering that.  Neither is she.   She seems to better & wants to advance her diet.   She needs a daily bowel regimen.   She feels like the MiraLAX daily makes her stools too watery.     Recommended that she switch to a different fiber supplement.  Perhaps try some soluble fiber.     She is tolerating some liquids, so will try FiberCon soluble fiber twice daily and see if that will help thicken and soften things.  I would adjust as she is having 2 bowel movements a day.   Her being on chronic narcotics for severe back pain and chronic pain issues puts her more vulnerable to these recurrent SBO obstructions.   She is hoping that she can get a spinal ablation to better control her pain and get off some of the medicines.  Since she has had some improvement with injections.  Waiting to hear from her back specialist on that.  That may help her wean off her narcotics which hopefully make her less constipated and vulnerable to recurrent SBO's.    LOS: 5 days    Lindsay Little 04/12/2023

## 2023-04-12 NOTE — Progress Notes (Addendum)
[facility] PROGRESS NOTE    Progress Note  Lindsay Little  ONG:[id number] DOB: 12/21/1965 DOA: 04/07/2023 PCP: Laurann Montana, MD     Brief Narrative:   Lindsay Little is an 57 y.o. female past medical history of anxiety chemotherapy-induced neuropathy, inflammatory bowel disease, history of rectal cancer status post surgical intervention with a diverting colostomy comes in with abdominal pain nausea and vomiting and decreased colostomy output, she denies any melanotic stools, CT scan of the abdomen and pelvis with suspicion for small bowel obstruction, was fluid resuscitated in the ED was giving IV magnesium, Reglan and narcotics, NG tube was never placed, KUB was repeated that showed persistent small bowel obstruction, general surgery was consulted who recommended to continue conservative management and start on a clear liquid diet   Assessment/Plan:   SBO (small bowel obstruction) (HCC) Appreciated on CT scan.  Tolerating her diet General surgery was consulted recommended conservative management. Try to keep potassium greater than 4 magnesium greater than 2 ambulate as tolerated. Tolerated her diet surgery recommended soft diet.  Currently on Zofran and prochlorperazine  for nausea and vomiting. Pain is controlled today. Output from her stoma has picked up. For management per surgery.  Essential hypertension: Continue bisoprolol.  Hypothyroidism: Continue Synthroid  Hyperglycemia: Regular diet and her blood glucose well-controlled.  Peripheral neuropathy: Resume Lyrica.  Chronic pain syndrome: She does not have her home medication. Further IV narcotics. Continue MS Contin and oral Dilaudid.  Hyperlipidemia: Continue statins.  Seizure disorder: Continue Keppra.  Thrombocytosis: Likely reactive.  Now resolved.  GERD: Changed to Protonix daily.  Morbid obesity: Noted she has been counseled.  DVT prophylaxis: lovenox Family  Communication:none Status is: Inpatient Small bowel obstruction    Code Status:     Code Status Orders  (From admission, onward)           Start     Ordered   04/07/23 0556  Full code  Continuous       Question:  By:  Answer:  Consent: discussion documented in EHR   04/07/23 0555           Code Status History     Date Active Date Inactive Code Status Order ID Comments User Context   02/15/2023 0846 02/18/2023 1550 Full Code [id number]  Maryln Gottron, MD ED   10/27/2022 0758 10/31/2022 1624 Full Code [id number]  Maryln Gottron, MD Inpatient   07/28/2022 0049 08/03/2022 1559 Full Code [id number]  Howerter, Chaney Born, DO Inpatient   07/15/2022 0832 07/20/2022 1804 Full Code [id number]  Bobette Mo, MD ED   02/15/2022 1429 02/18/2022 2022 Full Code [id number]  Teddy Spike, DO Inpatient   10/13/2020 0020 10/17/2020 1725 Full Code [id number]  Bobette Mo, MD Inpatient   07/15/2019 0816 07/21/2019 2107 Full Code [id number]  Clydie Braun, MD ED   07/23/2018 1506 07/26/2018 1324 Full Code [id number]  [name], MD Inpatient   04/24/2018 1057 04/24/2018 2056 Full Code [id number]  Tia Alert, MD Inpatient   02/12/2018 1314 02/13/2018 2126 Full Code [id number]  Tia Alert, MD Inpatient   07/10/2016 1848 07/17/2016 1638 Full Code [id number]  [name], MD Inpatient   07/10/2016 1522 07/10/2016 1812 Full Code [id number]  [name], MD Outpatient   05/31/2016 1138 06/03/2016 1834 Full Code [id number]  [name], MD Inpatient   12/20/2015 0727 12/22/2015 1208 Full Code [id number]  [name], MD Inpatient   12/19/2015 2006 12/20/2015 0727 Full Code [id number]  Glenna Fellows,  MD Inpatient   12/05/2015 1514 12/07/2015 1935 Full Code [id number]  [facility] Dull ED   09/15/2015 1720 09/17/2015 1510 Full Code [id number]  [name], MD Inpatient   06/23/2015 1611 06/29/2015 1355 Full Code [id number]  [name], MD Inpatient   05/25/2015 1609 05/29/2015 1313 Full Code [id number]   [name], MD Inpatient         IV Access:   Peripheral IV   Procedures and diagnostic studies:   No results found.   Medical Consultants:   None.   Subjective:    Lindsay Little pain is controlled good output through her stoma.  He is tolerating her diet  Objective:    Vitals:   04/11/23 1204 04/11/23 1958 04/12/23 0500 04/12/23 0519  BP: (!) 120/53 123/73  125/65  Pulse: 64 70  65  Resp: 20 18  17   Temp: 97.9 F (36.6 C) (!) 97.5 F (36.4 C)  97.8 F (36.6 C)  TempSrc: Oral Oral  Oral  SpO2: (!) 89% 94%  92%  Weight:   (!) 138.8 kg    SpO2: 92 %   Intake/Output Summary (Last 24 hours) at 04/12/2023 0857 Last data filed at 04/12/2023 0600 Gross per 24 hour  Intake 240 ml  Output 1500 ml  Net -1260 ml   Filed Weights   04/09/23 0500 04/12/23 0500  Weight: (!) 138.3 kg (!) 138.8 kg    Exam: General exam: In no acute distress. Respiratory system: Good air movement and clear to auscultation. Cardiovascular system: S1 & S2 heard, RRR. No JVD. Gastrointestinal system: Abdomen is nondistended, soft and nontender.  Skin: No rashes, lesions or ulcers Psychiatry: Judgement and insight appear normal. Mood & affect appropriate. Data Reviewed:    Labs: Basic Metabolic Panel: Recent Labs  Lab 04/07/23 0410 04/08/23 1435 04/10/23 0934  NA 137 138 136  K 3.6 4.3 3.7  CL 101 108 101  CO2 24 23 25   GLUCOSE 154* 117* 138*  BUN 14 7 7   CREATININE 0.67 0.55 0.86  CALCIUM 10.1 8.6* 9.4  MG 1.9 2.0  --   PHOS  --  2.0* 4.0   GFR Estimated Creatinine Clearance: 113 mL/min (by C-G formula based on SCr of 0.86 mg/dL). Liver Function Tests: Recent Labs  Lab 04/07/23 0410 04/08/23 1435 04/10/23 0934  AST 17 19  --   ALT 16 12  --   ALKPHOS 73 56  --   BILITOT 0.5 0.8  --   PROT 9.3* 7.2  --   ALBUMIN 3.8 3.1* 3.5   Recent Labs  Lab 04/07/23 0410  LIPASE 20   No results for input(s): "AMMONIA" in the last 168 hours. Coagulation  profile No results for input(s): "INR", "PROTIME" in the last 168 hours. COVID-19 Labs  No results for input(s): "DDIMER", "FERRITIN", "LDH", "CRP" in the last 72 hours.  Lab Results  Component Value Date   SARSCOV2NAA NEGATIVE 10/12/2020   SARSCOV2NAA NEGATIVE 07/15/2019    CBC: Recent Labs  Lab 04/07/23 0410 04/08/23 1435  WBC 11.3* 7.5  NEUTROABS 9.3* 5.0  HGB 13.8 10.3*  HCT 45.0 34.2*  MCV 82.4 84.9  PLT 477* 287   Cardiac Enzymes: No results for input(s): "CKTOTAL", "CKMB", "CKMBINDEX", "TROPONINI" in the last 168 hours. BNP (last 3 results) No results for input(s): "PROBNP" in the last 8760 hours. CBG: No results for input(s): "GLUCAP" in the last 168 hours. D-Dimer: No results for input(s): "DDIMER" in the last 72 hours. Hgb A1c: No  results for input(s): "HGBA1C" in the last 72 hours. Lipid Profile: No results for input(s): "CHOL", "HDL", "LDLCALC", "TRIG", "CHOLHDL", "LDLDIRECT" in the last 72 hours. Thyroid function studies: No results for input(s): "TSH", "T4TOTAL", "T3FREE", "THYROIDAB" in the last 72 hours.  Invalid input(s): "FREET3" Anemia work up: No results for input(s): "VITAMINB12", "FOLATE", "FERRITIN", "TIBC", "IRON", "RETICCTPCT" in the last 72 hours. Sepsis Labs: Recent Labs  Lab 04/07/23 0410 04/08/23 1435  WBC 11.3* 7.5   Microbiology No results found for this or any previous visit (from the past 240 hour(s)).   Medications:    bisoprolol  5 mg Oral Daily   DULoxetine  120 mg Oral QPM   famotidine  20 mg Oral BID   levETIRAcetam  500 mg Oral BID   levothyroxine  50 mcg Oral Q0600   morphine  15 mg Oral Q12H   oxybutynin  15 mg Oral Daily   pantoprazole  40 mg Oral Daily   polycarbophil  625 mg Oral BID   pregabalin  225 mg Oral BID   rosuvastatin  5 mg Oral QHS   senna-docusate  1 tablet Oral QHS   Continuous Infusions:  lactated ringers     ondansetron (ZOFRAN) IV        LOS: 5 days   Marinda Elk  [facility]  04/12/2023, 8:57 AM

## 2023-04-12 NOTE — Plan of Care (Signed)
  Problem: Clinical Measurements: Goal: Will remain free from infection Outcome: Progressing   Problem: Pain Managment: Goal: General experience of comfort will improve Outcome: Progressing   Problem: Skin Integrity: Goal: Risk for impaired skin integrity will decrease Outcome: Progressing   

## 2023-04-13 DIAGNOSIS — K56609 Unspecified intestinal obstruction, unspecified as to partial versus complete obstruction: Secondary | ICD-10-CM | POA: Diagnosis not present

## 2023-04-13 NOTE — Plan of Care (Signed)
  Problem: Clinical Measurements: Goal: Will remain free from infection Outcome: Progressing Goal: Diagnostic test results will improve Outcome: Progressing   Problem: Coping: Goal: Level of anxiety will decrease Outcome: Progressing   

## 2023-04-13 NOTE — TOC Progression Note (Signed)
Transition of Care [facility]) - Progression Note    Patient Details  Name: Lindsay Little MRN: [id number] Date of Birth: 03/17/1966  [facility] of [facility]) CM/SW Contact  Adrian Prows, RN Phone Number: 04/13/2023, 11:31 AM  Clinical Narrative:    Sherron Monday w/ pt in room; pt says she does not have issues w/ housing insecurity; no TOC needs.       Expected Discharge Plan and Services         Expected Discharge Date: 04/13/23                                     Social Determinants of Health (SDOH) Interventions SDOH Screenings   Food Insecurity: No Food Insecurity (04/07/2023)  Housing: Low Risk  (04/13/2023)  Recent Concern: Housing - High Risk (04/07/2023)  Transportation Needs: No Transportation Needs (04/07/2023)  Utilities: Not At Risk (04/07/2023)  Financial Resource Strain: High Risk (10/04/2022)   Received from [facility], [facility]  Physical Activity: Inactive (10/04/2022)   Received from [facility], [facility]  Social Connections: Moderately Integrated (10/04/2022)   Received from [facility], [facility]  Recent Concern: Social Connections - Somewhat Isolated (08/27/2022)   Received from [facility]  Stress: Stress Concern Present (10/04/2022)   Received from [facility], [facility]  Tobacco Use: Medium Risk (04/07/2023)    Readmission Risk Interventions    04/13/2023   10:44 AM 07/16/2022    3:05 PM  Readmission Risk Prevention Plan  Transportation Screening Complete Complete  PCP or Specialist Appt within 5-7 Days  Complete  Home Care Screening  Complete  Medication Review (RN CM)  Complete  Medication Review (RN Care Manager) Complete   PCP or Specialist appointment within 3-5 days of discharge Complete   HRI or Home Care Consult Complete   SW Recovery Care/Counseling Consult Complete   Palliative Care Screening Not Applicable   Skilled Nursing Facility Not Applicable

## 2023-04-13 NOTE — Plan of Care (Signed)

## 2023-04-13 NOTE — Discharge Summary (Signed)
Physician Discharge Summary  Lindsay Little ZOX:[id number] DOB: [birth year] DOA: 04/07/2023  PCP: Laurann Montana, MD  Admit date: 04/07/2023 Discharge date: 04/13/2023  Admitted From: Home Disposition:  Home  Recommendations for Outpatient Follow-up:  Follow up with pain clinic tomorrow 04/14/2023 she has an appointment.  Home Health:no Equipment/Devices:None  Discharge Condition:Stable CODE STATUS:Full Diet recommendation: Heart Healthy / Carb Modified / Regular / Dysphagia   Brief/Interim Summary:  57 y.o. female past medical history of anxiety chemotherapy-induced neuropathy, inflammatory bowel disease, history of rectal cancer status post surgical intervention with a diverting colostomy comes in with abdominal pain nausea and vomiting and decreased colostomy output, she denies any melanotic stools, CT scan of the abdomen and pelvis with suspicion for small bowel obstruction, was fluid resuscitated in the ED was giving IV magnesium, Reglan and narcotics, NG tube was never placed, KUB was repeated that showed persistent small bowel obstruction, general surgery was consulted who recommended to continue conservative management and start on a clear liquid diet   Discharge Diagnoses:  Principal Problem:   SBO (small bowel obstruction) - recurrent Active Problems:   Pelvic mass - Bx Sept2023 benign (no metastasis)   History of small bowel obstruction   Obesity, Class III, BMI 40-49.9 (morbid obesity) (HCC)   Rectal adenocarcinoma s/p LAR resection 05/25/2015   Crohn's disease (HCC)   Essential hypertension   IBS (irritable bowel syndrome)   Chronic pain syndrome   Acquired hypothyroidism   GERD (gastroesophageal reflux disease)   Hyperglycemia   Peripheral neuropathy due to chemotherapy (HCC)   Hyperlipidemia   Seizure disorder (HCC)  Small bowel obstruction: Appreciated on CT, general surgery was consulted recommended NG tube which the patient refused it was never placed.  Placed  n.p.o. IV fluids and monitor. She was kept n.p.o. she started having output through her stoma. Her diet was advanced slowly which she tolerated. Her home meds were resumed. General surgery recommended continue steroid management and follow-up with them as an outpatient  Essential hypertension: No changes made to her medication.  Hypothyroidism: Continue Synthroid.  Hyperglycemia: Likely reactive.  Peripheral neuropathy: Continue Lyrica no changes made.  Chronic pain syndrome/narcotic seeking behavior: No change made to her medication she has a follow-up appointment 04/14/2023 with the pain clinic.  Hyperlipidemia goal Continue statins.  Seizure disorder: Continue Keppra.  Thrombocytosis: Likely reactive now resolved.  GERD: Continue PPI.  Morbid obesity: Noted.  Discharge Instructions  Discharge Instructions     Diet - low sodium heart healthy   Complete by: As directed    Increase activity slowly   Complete by: As directed       Allergies as of 04/13/2023       Reactions   Caine-1 [lidocaine] Swelling, Rash   Eyes swell shut; includes all caine drugs except marcaine. EMLA cream OK though    Bupropion Other (See Comments)   Not effective per Pt.    Sulfa Antibiotics Nausea And Vomiting, Rash   Pantoprazole Other (See Comments)   Not effective per Pt.    Adhesive [tape] Rash, Other (See Comments)   Blisters - can use paper tape   Doxycycline Nausea Only   Flagyl [metronidazole] Nausea Only   Iron Nausea Only   Metoprolol Nausea Only, Palpitations   Oxycodone Other (See Comments)   NIGHTMARES. (tolerates hydrocodone or tramadol better)   Penicillins Nausea Only, Rash        Medication List     TAKE these medications    acetaminophen 500 MG tablet Commonly  known as: TYLENOL Take 1,000 mg by mouth as needed for moderate pain.   Belbuca 750 MCG Film Generic drug: Buprenorphine HCl Place 1 Film inside cheek every 12 (twelve) hours.    bisoprolol 5 MG tablet Commonly known as: ZEBETA Take 5 mg by mouth daily.   CoQ10 200 MG Caps Take 200 mg by mouth at bedtime.   DULoxetine 60 MG capsule Commonly known as: CYMBALTA Take 120 mg by mouth every evening.   esomeprazole 40 MG capsule Commonly known as: NEXIUM Take 1 capsule (40 mg total) by mouth 2 (two) times daily before a meal. What changed:  when to take this additional instructions   furosemide 20 MG tablet Commonly known as: LASIX Take 20 mg by mouth daily as needed for edema.   HYDROcodone-acetaminophen 7.5-325 MG tablet Commonly known as: NORCO Take 1 tablet by mouth 2 (two) times daily.   hyoscyamine 0.125 MG tablet Commonly known as: LEVSIN Take 0.125 mg by mouth 3 (three) times daily as needed for cramping.   levETIRAcetam 250 MG tablet Commonly known as: KEPPRA Take 500 mg by mouth 2 (two) times daily.   levothyroxine 50 MCG tablet Commonly known as: SYNTHROID Take 50 mcg by mouth every morning.   melatonin 3 MG Tabs tablet Take 3-6 mg by mouth at bedtime as needed (sleep).   naloxone 4 MG/0.1ML Liqd nasal spray kit Commonly known as: NARCAN Place 0.4 mg into the nose once.   oxybutynin 15 MG 24 hr tablet Commonly known as: DITROPAN XL Take 15 mg by mouth daily.   polyethylene glycol 17 g packet Commonly known as: MIRALAX / GLYCOLAX Take 17 g by mouth 2 (two) times daily.   pregabalin 225 MG capsule Commonly known as: LYRICA Take 225 mg by mouth 2 (two) times daily.   promethazine 25 MG tablet Commonly known as: PHENERGAN TAKE ONE TABLET BY MOUTH EVERY 6 HOURS AS NEEDED FOR NAUSEA OR VOMITING What changed: See the new instructions.   rosuvastatin 5 MG tablet Commonly known as: CRESTOR Take 5 mg by mouth at bedtime.   triamcinolone lotion 0.1 % Commonly known as: KENALOG Apply 1 application topically as needed (dry skin).   Vitamin D3 125 MCG (5000 UT) Caps Take 5,000 Units by mouth at bedtime.        Allergies   Allergen Reactions   Caine-1 [Lidocaine] Swelling and Rash    Eyes swell shut; includes all caine drugs except marcaine. EMLA cream OK though    Bupropion Other (See Comments)    Not effective per Pt.    Sulfa Antibiotics Nausea And Vomiting and Rash   Pantoprazole Other (See Comments)    Not effective per Pt.    Adhesive [Tape] Rash and Other (See Comments)    Blisters - can use paper tape   Doxycycline Nausea Only   Flagyl [Metronidazole] Nausea Only   Iron Nausea Only   Metoprolol Nausea Only and Palpitations   Oxycodone Other (See Comments)    NIGHTMARES. (tolerates hydrocodone or tramadol better)   Penicillins Nausea Only and Rash    Consultations: General Surgery   Procedures/Studies: DG Abd Portable 1V  Result Date: 04/09/2023 CLINICAL DATA:  Small bowel obstruction. EXAM: PORTABLE ABDOMEN - 1 VIEW COMPARISON:  Radiograph yesterday, additional priors.  CT 04/07/2023 FINDINGS: Diminishing gaseous small bowel distension centrally. Decreasing gaseous distention of colon, small volume of formed colonic stool. Bilateral nephroureteral stents remain in place. IMPRESSION: Diminishing gaseous small bowel distension centrally, radiographic features of improving obstruction. Electronically  Signed   By: Narda Rutherford M.D.   On: 04/09/2023 13:17   DG Abd Portable 1V  Result Date: 04/08/2023 CLINICAL DATA:  Small bowel obstruction. EXAM: PORTABLE ABDOMEN - 1 VIEW COMPARISON:  Radiographs and CT yesterday FINDINGS: Persistent gaseous small bowel distension centrally, up to 5 cm. Bilateral nephroureteral stents in place. Clearing of retained contrast in the renal collecting systems. IMPRESSION: Persistent gaseous small bowel distension centrally, up to 5 cm, consistent with small bowel obstruction. Electronically Signed   By: Narda Rutherford M.D.   On: 04/08/2023 10:27   DG Abd Portable 1V  Result Date: 04/07/2023 CLINICAL DATA:  Small-bowel obstruction EXAM: PORTABLE ABDOMEN - 1  VIEW COMPARISON:  10/30/2022; CT abdomen and pelvis-03/18/2023 FINDINGS: There is moderate-to-marked gaseous distention of multiple loops small bowel with index loop of small bowel measuring 4.9 cm in diameter with apparent mild bowel wall thickening. A small amount of distal colonic gas is seen within the ascending colon. Nondiagnostic evaluation for pneumoperitoneum secondary to supine positioning and exclusion of the lower thorax. No definite pneumatosis or portal venous gas Post bilateral ureteral stent placement. Excreted contrast is seen within the bilateral renal collecting system, extending to the level of the urinary bladder. Mild right-sided pelvicaliectasis. Phleboliths overlie the lower pelvis bilaterally. Moderate degenerative change of the bilateral hips with joint space loss, subchondral sclerosis and osteophytosis. IMPRESSION: 1. Findings compatible with known small bowel obstruction with associated small bowel wall thickening as demonstrated on preceding abdominal CT. 2. Post bilateral ureteral stent placement with mild right-sided pelvicaliectasis. Electronically Signed   By: Simonne Come M.D.   On: 04/07/2023 13:31   CT ABDOMEN PELVIS W CONTRAST  Result Date: 04/07/2023 CLINICAL DATA:  Evaluate for bowel obstruction. Complains of vomiting, upper abdominal pain and diarrhea. History of rectal cancer, status post low anterior resection. * Tracking Code: BO * EXAM: CT ABDOMEN AND PELVIS WITH CONTRAST TECHNIQUE: Multidetector CT imaging of the abdomen and pelvis was performed using the standard protocol following bolus administration of intravenous contrast. RADIATION DOSE REDUCTION: This exam was performed according to the departmental dose-optimization program which includes automated exposure control, adjustment of the mA and/or kV according to patient size and/or use of iterative reconstruction technique. CONTRAST:  OMNIPAQUE IOHEXOL 300 MG/ML  SOLN COMPARISON:  02/23/2023 FINDINGS: Lower  chest: No acute abnormality. Hepatobiliary: No focal liver abnormality is seen. No gallstones, gallbladder wall thickening, or biliary dilatation. Pancreas: Unremarkable. No pancreatic ductal dilatation or surrounding inflammatory changes. Spleen: Normal in size without focal abnormality. Adrenals/Urinary Tract: Normal adrenal glands. Mild bilateral renal cortical scarring. Bilateral nephroureteral stents are in place. Interval improvement in right-sided hydronephrosis. Scratch there is mild right-sided hydronephrosis, improved from previous exam. Stable left pelvocaliectasis. No focal bladder abnormality. Stomach/Bowel: Postoperative changes from low anterior resection with left abdominal colostomy. Fluid distension of the stomach. Increase caliber of the proximal small bowel loops with air-fluid levels. Small bowel loops now measure up to 4.9 cm, image 48/2. Transition to decreased caliber distal small bowel is identified within the lower abdomen, image 69/2. The colon is normal in caliber of to the colostomy site. Vascular/Lymphatic: Aortic atherosclerosis. Mild right lower quadrant mesenteric adenopathy. Index lymph node measures 1 cm, image 67/2. Previously this measured the same. No additional adenopathy identified. Reproductive: Status post hysterectomy. No adnexal masses. Other: Post treatment changes identified within the presacral fat region. Increased soft tissue in this area measures 9.1 x 5.8 cm, image 83/2. This appears unchanged in size from previous exam. Central focus  of fluid and gas within this area measures 4.6 x 1.7 cm, image 85/2. Previously 5.9 x 2.8 cm previously. Musculoskeletal: Postoperative changes identified within the lower lumbar spine. No acute or suspicious osseous bone lesions. IMPRESSION: 1. Postoperative changes from low anterior resection with left abdominal colostomy. 2. Increase caliber of the proximal small bowel loops with air-fluid levels. Transition to decreased caliber  distal small bowel is identified within the lower abdomen. Findings are consistent with small bowel obstruction. 3. Post treatment changes identified within the presacral fat region. Within this area, there is a fluid and gas collection which has decreased in size from previous exam. 4. Bilateral nephroureteral stents are in place. Interval improvement in right-sided hydronephrosis. 5. Aortic Atherosclerosis (ICD10-I70.0). Electronically Signed   By: Signa Kell M.D.   On: 04/07/2023 05:31   (Echo, Carotid, EGD, Colonoscopy, ERCP)    Subjective: No complaints  Discharge Exam: Vitals:   04/12/23 1959 04/13/23 0556  BP: 110/82 117/62  Pulse: 63 63  Resp: 20 19  Temp: 98.7 F (37.1 C) 98.8 F (37.1 C)  SpO2: 92% 94%   Vitals:   04/12/23 1206 04/12/23 1959 04/13/23 0500 04/13/23 0556  BP: 127/82 110/82  117/62  Pulse: (!) 59 63  63  Resp:  20  19  Temp: 98.7 F (37.1 C) 98.7 F (37.1 C)  98.8 F (37.1 C)  TempSrc: Oral Oral  Oral  SpO2: 91% 92%  94%  Weight:   (!) 137.6 kg     General: Pt is alert, awake, not in acute distress Cardiovascular: RRR, S1/S2 +, no rubs, no gallops Respiratory: CTA bilaterally, no wheezing, no rhonchi Abdominal: Soft, NT, ND, bowel sounds + Extremities: no edema, no cyanosis    The results of significant diagnostics from this hospitalization (including imaging, microbiology, ancillary and laboratory) are listed below for reference.     Microbiology: No results found for this or any previous visit (from the past 240 hour(s)).   Labs: BNP (last 3 results) No results for input(s): "BNP" in the last 8760 hours. Basic Metabolic Panel: Recent Labs  Lab 04/07/23 0410 04/08/23 1435 04/10/23 0934  NA 137 138 136  K 3.6 4.3 3.7  CL 101 108 101  CO2 24 23 25   GLUCOSE 154* 117* 138*  BUN 14 7 7   CREATININE 0.67 0.55 0.86  CALCIUM 10.1 8.6* 9.4  MG 1.9 2.0  --   PHOS  --  2.0* 4.0   Liver Function Tests: Recent Labs  Lab 04/07/23 0410  04/08/23 1435 04/10/23 0934  AST 17 19  --   ALT 16 12  --   ALKPHOS 73 56  --   BILITOT 0.5 0.8  --   PROT 9.3* 7.2  --   ALBUMIN 3.8 3.1* 3.5   Recent Labs  Lab 04/07/23 0410  LIPASE 20   No results for input(s): "AMMONIA" in the last 168 hours. CBC: Recent Labs  Lab 04/07/23 0410 04/08/23 1435  WBC 11.3* 7.5  NEUTROABS 9.3* 5.0  HGB 13.8 10.3*  HCT 45.0 34.2*  MCV 82.4 84.9  PLT 477* 287   Cardiac Enzymes: No results for input(s): "CKTOTAL", "CKMB", "CKMBINDEX", "TROPONINI" in the last 168 hours. BNP: Invalid input(s): "POCBNP" CBG: No results for input(s): "GLUCAP" in the last 168 hours. D-Dimer No results for input(s): "DDIMER" in the last 72 hours. Hgb A1c No results for input(s): "HGBA1C" in the last 72 hours. Lipid Profile No results for input(s): "CHOL", "HDL", "LDLCALC", "TRIG", "CHOLHDL", "LDLDIRECT" in the  last 72 hours. Thyroid function studies No results for input(s): "TSH", "T4TOTAL", "T3FREE", "THYROIDAB" in the last 72 hours.  Invalid input(s): "FREET3" Anemia work up No results for input(s): "VITAMINB12", "FOLATE", "FERRITIN", "TIBC", "IRON", "RETICCTPCT" in the last 72 hours. Urinalysis    Component Value Date/Time   COLORURINE YELLOW 04/07/2023 0828   APPEARANCEUR CLEAR 04/07/2023 0828   LABSPEC >1.046 (H) 04/07/2023 0828   LABSPEC 1.010 02/26/2016 1604   PHURINE 8.0 04/07/2023 0828   GLUCOSEU NEGATIVE 04/07/2023 0828   GLUCOSEU Negative 02/26/2016 1604   HGBUR MODERATE (A) 04/07/2023 0828   BILIRUBINUR NEGATIVE 04/07/2023 0828   BILIRUBINUR Negative 02/26/2016 1604   KETONESUR NEGATIVE 04/07/2023 0828   PROTEINUR 100 (A) 04/07/2023 0828   UROBILINOGEN 0.2 02/26/2016 1604   NITRITE NEGATIVE 04/07/2023 0828   LEUKOCYTESUR LARGE (A) 04/07/2023 0828   LEUKOCYTESUR Trace 02/26/2016 1604   Sepsis Labs Recent Labs  Lab 04/07/23 0410 04/08/23 1435  WBC 11.3* 7.5   Microbiology No results found for this or any previous visit (from  the past 240 hour(s)).   Time coordinating discharge: Over 35 minutes  SIGNED:   Marinda Elk, MD  [facility] 04/13/2023, 9:43 AM Pager   If 7PM-7AM, please contact night-coverage [email or web address] Password [email or web address]

## 2023-04-13 NOTE — Progress Notes (Signed)
Subjective/Chief Complaint: No complaints. Tolerating diet. Going home later today   Objective: Vital signs in last 24 hours: Temp:  [98.6 F (37 C)-98.8 F (37.1 C)] 98.8 F (37.1 C) (09/29 0556) Pulse Rate:  [59-69] 63 (09/29 0556) Resp:  [19-20] 19 (09/29 0556) BP: (110-127)/(43-82) 117/62 (09/29 0556) SpO2:  [91 %-97 %] 94 % (09/29 0556) Weight:  [137.6 kg] 137.6 kg (09/29 0500) Last BM Date : 04/12/23  Intake/Output from previous day: 09/28 0701 - 09/29 0700 In: 600 [P.O.:600] Out: 70 [Stool:70] Intake/Output this shift: No intake/output data recorded.  General appearance: alert and cooperative Resp: clear to auscultation bilaterally Cardio: regular rate and rhythm GI: soft, nontender  Lab Results:  No results for input(s): "WBC", "HGB", "HCT", "PLT" in the last 72 hours. BMET Recent Labs    04/10/23 0934  NA 136  K 3.7  CL 101  CO2 25  GLUCOSE 138*  BUN 7  CREATININE 0.86  CALCIUM 9.4   PT/INR No results for input(s): "LABPROT", "INR" in the last 72 hours. ABG No results for input(s): "PHART", "HCO3" in the last 72 hours.  Invalid input(s): "PCO2", "PO2"  Studies/Results: No results found.  Anti-infectives: Anti-infectives (From admission, onward)    None       Assessment/Plan: s/p * No surgery found * Advance diet No indication for surgery at this point History of rectal cancer status post low anterior resection 2016 complicated by postop collection that turned abscess to turn to delayed leak.  Diversion.  Persistent leak.  Omental pedicle flap.  Leak resolution radiographically and endoscopically.  Diverting loop ileostomy takedown.  Recurrence.  Eventual Hartmann resection and permanent colostomy.  History of back surgery in 2019 complicated by back abscess as well.  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     She has chronic ureteral obstructions needing ureteral stents every 3 months by Dr.  Logan Bores through [facility].   She notes that she had a month where she had no vaginal rectal drainage and then had more.  She is due to get a ureteral stent switched out by Dr. Logan Bores with [facility] urology.  Defer to them.   She still gets intermittent vaginal and rectal drainage with a chronic low pelvic fluid collection that will spontaneously drain.   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     However, I have already done this with debridement & ACell in 2020 and she recurred in drainage within a few months.  I would not offer it myself again.    She would have to refer her to a major regional/national referral center that would consider doing that.     I am hesitant to recommend just chronic antibiotics unless she has documented chronic infection from the vagina or drainage.  Urology does not put antibiotics for chronic restenting and her urinalysis has shown only mild urine contamination but no major UTI.     She has pelvic floor thickening and concern of a mass that is suspicious on PET scan but has been biopsied twice that is negative for any metastasis.   She comes in recurrent bowel obstruction.   Per her usual, she is holding off on NG tube hoping things will get better with IV fluids and nausea  and pain control.     Her SBO transition zone seems to be in the left upper pelvis near the pelvic brim since the deep pelvis is filled up with the omental pedicle flap.  Could consider robotic lysis adhesions if the obstructions happen more often to see if there is some chronic transition point or area that can be trimmed or opened up.  However, I had done moderate lysis of adhesions in 2020 already.  With her numerous abdominal surgeries and chronic pelvic  inflammation, risk of small bowel injury or delayed leak or fistula definitely definitely high with her numerous abdominal surgeries and obesity.  I am not excited about offering that.  Neither is she.   She seems to better & wants to advance her diet.   She needs a daily bowel regimen.   She feels like the MiraLAX daily makes her stools too watery.     Recommended that she switch to a different fiber supplement.  Perhaps try some soluble fiber.     She is tolerating some liquids, so will try FiberCon soluble fiber twice daily and see if that will help thicken and soften things.  I would adjust as she is having 2 bowel movements a day.   Her being on chronic narcotics for severe back pain and chronic pain issues puts her more vulnerable to these recurrent SBO obstructions.   She is hoping that she can get a spinal ablation to better control her pain and get off some of the medicines.  Since she has had some improvement with injections.  Waiting to hear from her back specialist on that.  That may help her wean off her narcotics which hopefully make her less constipated and vulnerable to recurrent SBO's.    LOS: 6 days    Chevis Pretty III 04/13/2023

## 2023-04-14 DIAGNOSIS — M47816 Spondylosis without myelopathy or radiculopathy, lumbar region: Secondary | ICD-10-CM | POA: Diagnosis not present

## 2023-04-14 DIAGNOSIS — M533 Sacrococcygeal disorders, not elsewhere classified: Secondary | ICD-10-CM | POA: Diagnosis not present

## 2023-04-14 DIAGNOSIS — G894 Chronic pain syndrome: Secondary | ICD-10-CM | POA: Diagnosis not present

## 2023-04-18 DIAGNOSIS — D1803 Hemangioma of intra-abdominal structures: Secondary | ICD-10-CM | POA: Diagnosis not present

## 2023-04-18 DIAGNOSIS — Z79899 Other long term (current) drug therapy: Secondary | ICD-10-CM | POA: Diagnosis not present

## 2023-04-18 DIAGNOSIS — Z79890 Hormone replacement therapy (postmenopausal): Secondary | ICD-10-CM | POA: Diagnosis not present

## 2023-04-18 DIAGNOSIS — Z85048 Personal history of other malignant neoplasm of rectum, rectosigmoid junction, and anus: Secondary | ICD-10-CM | POA: Diagnosis not present

## 2023-04-18 DIAGNOSIS — Z882 Allergy status to sulfonamides status: Secondary | ICD-10-CM | POA: Diagnosis not present

## 2023-04-18 DIAGNOSIS — Z888 Allergy status to other drugs, medicaments and biological substances status: Secondary | ICD-10-CM | POA: Diagnosis not present

## 2023-04-18 DIAGNOSIS — N1330 Unspecified hydronephrosis: Secondary | ICD-10-CM | POA: Diagnosis not present

## 2023-04-18 DIAGNOSIS — I10 Essential (primary) hypertension: Secondary | ICD-10-CM | POA: Diagnosis not present

## 2023-04-18 DIAGNOSIS — Z884 Allergy status to anesthetic agent status: Secondary | ICD-10-CM | POA: Diagnosis not present

## 2023-04-18 DIAGNOSIS — N131 Hydronephrosis with ureteral stricture, not elsewhere classified: Secondary | ICD-10-CM | POA: Diagnosis not present

## 2023-05-08 DIAGNOSIS — Z85048 Personal history of other malignant neoplasm of rectum, rectosigmoid junction, and anus: Secondary | ICD-10-CM | POA: Diagnosis not present

## 2023-05-08 DIAGNOSIS — N898 Other specified noninflammatory disorders of vagina: Secondary | ICD-10-CM | POA: Diagnosis not present

## 2023-05-13 ENCOUNTER — Other Ambulatory Visit (HOSPITAL_COMMUNITY): Payer: Self-pay

## 2023-05-13 DIAGNOSIS — G894 Chronic pain syndrome: Secondary | ICD-10-CM | POA: Diagnosis not present

## 2023-05-13 DIAGNOSIS — M533 Sacrococcygeal disorders, not elsewhere classified: Secondary | ICD-10-CM | POA: Diagnosis not present

## 2023-05-13 DIAGNOSIS — M47816 Spondylosis without myelopathy or radiculopathy, lumbar region: Secondary | ICD-10-CM | POA: Diagnosis not present

## 2023-05-13 DIAGNOSIS — Z79899 Other long term (current) drug therapy: Secondary | ICD-10-CM | POA: Diagnosis not present

## 2023-05-13 DIAGNOSIS — M961 Postlaminectomy syndrome, not elsewhere classified: Secondary | ICD-10-CM | POA: Diagnosis not present

## 2023-05-13 DIAGNOSIS — Z5181 Encounter for therapeutic drug level monitoring: Secondary | ICD-10-CM | POA: Diagnosis not present

## 2023-05-13 MED ORDER — HYDROMORPHONE HCL 4 MG PO TABS
4.0000 mg | ORAL_TABLET | Freq: Four times a day (QID) | ORAL | 0 refills | Status: AC | PRN
  Filled 2023-05-13: qty 120, 30d supply, fill #0

## 2023-05-27 ENCOUNTER — Other Ambulatory Visit (HOSPITAL_BASED_OUTPATIENT_CLINIC_OR_DEPARTMENT_OTHER): Payer: Self-pay

## 2023-05-27 ENCOUNTER — Observation Stay (HOSPITAL_BASED_OUTPATIENT_CLINIC_OR_DEPARTMENT_OTHER)
Admission: EM | Admit: 2023-05-27 | Discharge: 2023-05-29 | Disposition: A | Discharge status: Home / Self Care | Payer: Medicare HMO | Attending: Internal Medicine | Admitting: Internal Medicine

## 2023-05-27 ENCOUNTER — Encounter (HOSPITAL_COMMUNITY): Payer: Self-pay | Admitting: Internal Medicine

## 2023-05-27 ENCOUNTER — Emergency Department (HOSPITAL_BASED_OUTPATIENT_CLINIC_OR_DEPARTMENT_OTHER): Payer: Medicare HMO

## 2023-05-27 ENCOUNTER — Other Ambulatory Visit: Payer: Self-pay

## 2023-05-27 ENCOUNTER — Encounter: Payer: Self-pay | Admitting: Hematology

## 2023-05-27 DIAGNOSIS — Z23 Encounter for immunization: Secondary | ICD-10-CM | POA: Diagnosis not present

## 2023-05-27 DIAGNOSIS — E039 Hypothyroidism, unspecified: Secondary | ICD-10-CM | POA: Diagnosis not present

## 2023-05-27 DIAGNOSIS — I10 Essential (primary) hypertension: Secondary | ICD-10-CM | POA: Diagnosis present

## 2023-05-27 DIAGNOSIS — R109 Unspecified abdominal pain: Secondary | ICD-10-CM | POA: Diagnosis present

## 2023-05-27 DIAGNOSIS — Z85048 Personal history of other malignant neoplasm of rectum, rectosigmoid junction, and anus: Secondary | ICD-10-CM | POA: Insufficient documentation

## 2023-05-27 DIAGNOSIS — G894 Chronic pain syndrome: Secondary | ICD-10-CM | POA: Diagnosis present

## 2023-05-27 DIAGNOSIS — G40909 Epilepsy, unspecified, not intractable, without status epilepticus: Secondary | ICD-10-CM | POA: Diagnosis not present

## 2023-05-27 DIAGNOSIS — Z933 Colostomy status: Secondary | ICD-10-CM | POA: Diagnosis not present

## 2023-05-27 DIAGNOSIS — N131 Hydronephrosis with ureteral stricture, not elsewhere classified: Principal | ICD-10-CM | POA: Insufficient documentation

## 2023-05-27 DIAGNOSIS — N1330 Unspecified hydronephrosis: Secondary | ICD-10-CM | POA: Diagnosis not present

## 2023-05-27 DIAGNOSIS — R10A1 Flank pain, right side: Secondary | ICD-10-CM | POA: Diagnosis present

## 2023-05-27 DIAGNOSIS — Z79899 Other long term (current) drug therapy: Secondary | ICD-10-CM | POA: Diagnosis not present

## 2023-05-27 DIAGNOSIS — Z87891 Personal history of nicotine dependence: Secondary | ICD-10-CM | POA: Diagnosis not present

## 2023-05-27 DIAGNOSIS — E785 Hyperlipidemia, unspecified: Secondary | ICD-10-CM | POA: Diagnosis not present

## 2023-05-27 DIAGNOSIS — C20 Malignant neoplasm of rectum: Secondary | ICD-10-CM | POA: Diagnosis present

## 2023-05-27 DIAGNOSIS — N132 Hydronephrosis with renal and ureteral calculous obstruction: Principal | ICD-10-CM | POA: Insufficient documentation

## 2023-05-27 DIAGNOSIS — R1031 Right lower quadrant pain: Secondary | ICD-10-CM | POA: Diagnosis not present

## 2023-05-27 LAB — URINALYSIS, ROUTINE W REFLEX MICROSCOPIC
Bacteria, UA: NONE SEEN
Bilirubin Urine: NEGATIVE
Glucose, UA: NEGATIVE mg/dL
Ketones, ur: NEGATIVE mg/dL
Nitrite: NEGATIVE
Protein, ur: 100 mg/dL — AB
RBC / HPF: >50 RBC/hpf (ref 0–5)
Specific Gravity, Urine: 1.024 (ref 1.005–1.030)
WBC, UA: >50 WBC/hpf (ref 0–5)
pH: 6 (ref 5.0–8.0)

## 2023-05-27 LAB — CBC
HCT: 37.6 % (ref 36.0–46.0)
Hemoglobin: 11.5 g/dL — ABNORMAL LOW (ref 12.0–15.0)
MCH: 25 pg — ABNORMAL LOW (ref 26.0–34.0)
MCHC: 30.6 g/dL (ref 30.0–36.0)
MCV: 81.7 fL (ref 80.0–100.0)
Platelets: 279 10*3/uL (ref 150–400)
RBC: 4.6 MIL/uL (ref 3.87–5.11)
RDW: 16.1 % — ABNORMAL HIGH (ref 11.5–15.5)
WBC: 8.4 10*3/uL (ref 4.0–10.5)
nRBC: 0 % (ref 0.0–0.2)

## 2023-05-27 LAB — COMPREHENSIVE METABOLIC PANEL
ALT: 6 U/L (ref 0–44)
AST: 9 U/L — ABNORMAL LOW (ref 15–41)
Albumin: 3.7 g/dL (ref 3.5–5.0)
Alkaline Phosphatase: 71 U/L (ref 38–126)
Anion gap: 9 (ref 5–15)
BUN: 20 mg/dL (ref 6–20)
CO2: 24 mmol/L (ref 22–32)
Calcium: 10.5 mg/dL — ABNORMAL HIGH (ref 8.9–10.3)
Chloride: 106 mmol/L (ref 98–111)
Creatinine, Ser: 1.09 mg/dL — ABNORMAL HIGH (ref 0.44–1.00)
GFR, Estimated: 60 mL/min — ABNORMAL LOW (ref >60)
Glucose, Bld: 113 mg/dL — ABNORMAL HIGH (ref 70–99)
Potassium: 3.8 mmol/L (ref 3.5–5.1)
Sodium: 139 mmol/L (ref 135–145)
Total Bilirubin: 0.3 mg/dL (ref <1.2)
Total Protein: 8.3 g/dL — ABNORMAL HIGH (ref 6.5–8.1)

## 2023-05-27 LAB — LIPASE, BLOOD: Lipase: 15 U/L (ref 11–51)

## 2023-05-27 MED ORDER — BISACODYL 5 MG PO TBEC: 5.0000 mg | DELAYED_RELEASE_TABLET | Freq: Every day | ORAL | Status: DC | PRN

## 2023-05-27 MED ORDER — TAMSULOSIN HCL 0.4 MG PO CAPS: 0.4000 mg | ORAL_CAPSULE | Freq: Every day | ORAL | Status: DC

## 2023-05-27 MED ORDER — PREGABALIN 75 MG PO CAPS
225.0000 mg | ORAL_CAPSULE | Freq: Two times a day (BID) | ORAL | Status: DC
  Administered 2023-05-27 – 2023-05-29 (×3): 225 mg via ORAL
  Filled 2023-05-27 (×3): qty 3

## 2023-05-27 MED ORDER — HYDROMORPHONE HCL 1 MG/ML IJ SOLN
2.0000 mg | Freq: Once | INTRAMUSCULAR | Status: AC
  Administered 2023-05-27: 2 mg via INTRAVENOUS
  Filled 2023-05-27: qty 2

## 2023-05-27 MED ORDER — POLYETHYLENE GLYCOL 3350 17 G PO PACK: 17.0000 g | PACK | Freq: Every day | ORAL | Status: DC | PRN

## 2023-05-27 MED ORDER — HYDROMORPHONE HCL 1 MG/ML IJ SOLN
1.0000 mg | Freq: Once | INTRAMUSCULAR | Status: AC
  Administered 2023-05-27: 1 mg via INTRAVENOUS
  Filled 2023-05-27: qty 1

## 2023-05-27 MED ORDER — ACETAMINOPHEN 325 MG PO TABS: 650.0000 mg | ORAL_TABLET | Freq: Four times a day (QID) | ORAL | Status: DC | PRN

## 2023-05-27 MED ORDER — DULOXETINE HCL 30 MG PO CPEP
120.0000 mg | ORAL_CAPSULE | Freq: Every day | ORAL | Status: DC
  Administered 2023-05-27 – 2023-05-28 (×2): 120 mg via ORAL
  Filled 2023-05-27 (×2): qty 4

## 2023-05-27 MED ORDER — PANTOPRAZOLE SODIUM 40 MG PO TBEC: 40.0000 mg | DELAYED_RELEASE_TABLET | Freq: Every day | ORAL | Status: DC

## 2023-05-27 MED ORDER — LEVETIRACETAM 500 MG PO TABS
500.0000 mg | ORAL_TABLET | Freq: Two times a day (BID) | ORAL | Status: DC
  Administered 2023-05-27 – 2023-05-29 (×3): 500 mg via ORAL
  Filled 2023-05-27 (×3): qty 1

## 2023-05-27 MED ORDER — MELATONIN 3 MG PO TABS: 3.0000 mg | ORAL_TABLET | Freq: Every evening | ORAL | Status: DC | PRN

## 2023-05-27 MED ORDER — ROSUVASTATIN CALCIUM 5 MG PO TABS
5.0000 mg | ORAL_TABLET | Freq: Every evening | ORAL | Status: DC
  Administered 2023-05-27 – 2023-05-28 (×2): 5 mg via ORAL
  Filled 2023-05-27 (×2): qty 1

## 2023-05-27 MED ORDER — HYDROMORPHONE HCL 1 MG/ML IJ SOLN
0.5000 mg | Freq: Once | INTRAMUSCULAR | Status: AC
  Administered 2023-05-27: 0.5 mg via INTRAVENOUS
  Filled 2023-05-27: qty 1

## 2023-05-27 MED ORDER — LEVOTHYROXINE SODIUM 50 MCG PO TABS
50.0000 ug | ORAL_TABLET | Freq: Every day | ORAL | Status: DC
  Administered 2023-05-28 – 2023-05-29 (×2): 50 ug via ORAL
  Filled 2023-05-27 (×2): qty 1

## 2023-05-27 MED ORDER — BISOPROLOL FUMARATE 5 MG PO TABS: 5.0000 mg | ORAL_TABLET | Freq: Every day | ORAL | Status: DC

## 2023-05-27 MED ORDER — ONDANSETRON HCL 4 MG/2ML IJ SOLN
4.0000 mg | Freq: Once | INTRAMUSCULAR | Status: AC
  Administered 2023-05-27: 4 mg via INTRAVENOUS
  Filled 2023-05-27: qty 2

## 2023-05-27 MED ORDER — HYDROMORPHONE HCL 2 MG PO TABS
4.0000 mg | ORAL_TABLET | Freq: Four times a day (QID) | ORAL | Status: DC | PRN
  Administered 2023-05-27 – 2023-05-28 (×2): 4 mg via ORAL
  Filled 2023-05-27 (×2): qty 2

## 2023-05-27 MED ORDER — ONDANSETRON HCL 4 MG/2ML IJ SOLN
4.0000 mg | Freq: Three times a day (TID) | INTRAMUSCULAR | Status: DC | PRN
  Administered 2023-05-28 – 2023-05-29 (×2): 4 mg via INTRAVENOUS
  Filled 2023-05-27 (×2): qty 2

## 2023-05-27 MED ORDER — HYDROMORPHONE HCL 1 MG/ML IJ SOLN
1.0000 mg | INTRAMUSCULAR | Status: AC | PRN
  Administered 2023-05-27 – 2023-05-28 (×4): 1 mg via INTRAVENOUS
  Filled 2023-05-27 (×4): qty 1

## 2023-05-27 MED ORDER — ACETAMINOPHEN 650 MG RE SUPP: 650.0000 mg | Freq: Four times a day (QID) | RECTAL | Status: DC | PRN

## 2023-05-27 MED ORDER — INFLUENZA VIRUS VACC SPLIT PF (FLUZONE) 0.5 ML IM SUSY
0.5000 mL | PREFILLED_SYRINGE | Freq: Once | INTRAMUSCULAR | Status: AC
  Administered 2023-05-29: 0.5 mL via INTRAMUSCULAR
  Filled 2023-05-27: qty 0.5

## 2023-05-27 MED ORDER — IOHEXOL 300 MG/ML  SOLN
100.0000 mL | Freq: Once | INTRAMUSCULAR | Status: AC | PRN
  Administered 2023-05-27: 100 mL via INTRAVENOUS

## 2023-05-27 NOTE — ED Notes (Signed)
Handoff report given to [facility] 

## 2023-05-27 NOTE — Hospital Course (Addendum)
Lindsay Little is a 57 y.o. female with medical history significant for colorectal cancer s/p surgical resection with diverting colostomy, rectivaginal fistula s/p repair, chronic obstructive uropathy with bilateral hydronephrosis s/p multiple urological intervention most recently bilateral ureteral stent exchange 04/18/2023, HTN, HLD, hypothyroidism, seizure disorder, chronic pain syndrome who presented to [facility] for flank pain in the setting of worsening bilateral hydronephrosis.  CT imaging on arrival revealed while the bilateral ureteral stents were in place patient was exhibiting worsening bilateral hydronephrosis which was thought to be the cause of her pain.  The hospitalist group was then called to assess the patient for admission to the hospital.  Dr. Alvester Morin with urology was consulted who took the patient to the operating room on 05/28/2023 for cystoscopy with bilateral retrograde pyelogram and bilateral ureteral stent exchange and upsize.  The procedure was tolerated well with the patient's postoperative course.  Patient was monitored while hospitalized for an additional 24 hours postoperatively due to continued need for titration of opiate-based analgesics.    Gradually, the patient's pain improved.  Patient's diet was tolerated successfully with patient able to void adequately.  Patient was discharged in improved and stable condition on 05/29/2023.

## 2023-05-27 NOTE — ED Notes (Signed)
Thomas with cl called for transport

## 2023-05-27 NOTE — ED Notes (Signed)
PA at bedside.

## 2023-05-27 NOTE — ED Notes (Signed)
Patient transported to CT 

## 2023-05-27 NOTE — ED Notes (Signed)
PA at bedside to update patient.

## 2023-05-27 NOTE — H&P (Signed)
History and Physical    Lindsay Little NWG:[id number] DOB: [birth year] DOA: 05/27/2023  PCP: Laurann Montana, MD  Patient coming from: Home  I have personally briefly reviewed patient's old medical records in [facility]  Chief Complaint: Right flank pain  HPI: Lindsay Little is a 57 y.o. female with medical history significant for colorectal cancer s/p surgical resection with diverting colostomy, rectovaginal fistula s/p repair, chronic obstructive uropathy with bilateral hydronephrosis s/p multiple urological intervention most recently bilateral ureteral stent exchange 04/18/2023, HTN, HLD, hypothyroidism, seizure disorder, chronic pain syndrome who presented to the ED for evaluation of right flank pain.  Patient with known chronic bilateral ureteral obstruction with hydronephrosis due to carcinoma of the rectum/colon.  She has required bilateral ureteral stenting and follows with [facility] urology Dr. Logan Bores for stent exchange every 3 months.  Most recent stent exchange performed 04/18/2023.  Patient states she went to bed in her usual state of health last night.  She woke from sleep around 5 AM with severe right-sided flank pain.  She has had associated nausea but no vomiting.  She says this pain feels similar to when her ureteral stent became displaced in the past.  She has seen some bleeding but she is not sure if this is hematuria or vaginal bleeding.  She says this type of bleeding happens intermittently.  She says her ostomy is functioning well with no obvious hematochezia or melena.  [facility] ED Course  Labs/Imaging on admission: I have personally reviewed following labs and imaging studies.  Initial vitals showed BP 147/77, pulse 80, RR 23, temp 99.7 F, SpO2 90% on room air.  Labs show WBC 8.4, hemoglobin 11.5, platelets 279,000, sodium 139, potassium 3.8, bicarb 24, BUN 20, creatinine 1.09, serum glucose 113, calcium 10.5, albumin 3.7, lipase  15.  UA showed 100 protein, negative nitrites, large leukocytes, greater than 50 RBCs and WBCs, no bacteria microscopy.  CT abdomen/pelvis with contrast showed bilateral ureteral stents in place.  Worsening bilateral hydronephrosis, severe on the right and moderate on the left.  Postoperative changes from partial left hemicolectomy with left abdominal colostomy noted.  No bowel obstruction.  Increasing postoperative fluid collection in the lower pelvis/presacral space, now 5.4 x 2.5 cm compared to 4.6 x 1.7 cm previously.  Patient was given IV Dilaudid x 5, IV Zofran.  EDP discussed with urology Dr. Alvester Morin who recommended medical admission.  The hospitalist service was consulted to admit for further evaluation and management.  Review of Systems: All systems reviewed and are negative except as documented in history of present illness above.   Past Medical History:  Diagnosis Date   Anxiety    Chemotherapy-induced neuropathy (HCC)    toes and fingers numbness and tingling   Colorectal delayed anastomotic leak s/p resection & colostomy 07/12/2016 11/17/2015   Colostomy in place [facility]) 07/12/2016   due to anastomosis breakdown w/ colovaginal fistula   Colovaginal fistula s/p omentopexy repair 07/12/2016 07/10/2016   Complication of anesthesia    Depression    Family history of adverse reaction to anesthesia    mother-- ponv   Family history of breast cancer    Family history of pancreatic cancer    Family history of skin cancer    Gastroenteritis 12/20/2015   GERD (gastroesophageal reflux disease)    Hardware complicating wound infection (HCC) 08/03/2019   Hiatal hernia    History of cancer chemotherapy 02-20-2015 to 03-30-2015   History of cardiac murmur as a child  History of chemotherapy    History of chronic gastritis    History of hypertension    no issues since multiple abdminal sx's and chemo--- no medication since 12/ 2016   History of TMJ disorder    Hypercholesteremia     Hypertension    IBS (irritable bowel syndrome)    dx age 14   Intermittent abdominal pain    post-op multiple abdominal sx's    Microcytic anemia    Mild sleep apnea    per pt study 2014  very mild osa , no cpap recommended, recommeded wt loss and sleep routine   OA (osteoarthritis)    left knee /  left shoulder   Obesity    Parastomal hernia s/p lap repair w mesh 07/23/2018 07/23/2018   Pelvic abscess s/p drainage & omental pedicle flap 11/17/2015 06/23/2015   PONV (postoperative nausea and vomiting)    severe" needs Scopolamine PATCH"    Portacath in place    right chest   Rectal adenocarcinoma (HCC) oncologis-  dr Mosetta Putt-- after radiation/ chemo (ypT1, N0) --  no recurrence per last note 03/ 2018   dx 01-13-2015-- Stage IIIC (T3, N2, M0) post concurrent radiation and chemotherapy 02-20-2015 to 03-30-2015 /  05-25-2015 s/p  LAR w/ RSO (post-op complicated by late abscess and contained anatomotic leak w/ help percutaneous drainage and antibiotics)    Rectocutaneous fistula 07/10/2016   Rotator cuff tear, left    S/P radiation therapy 02/20/15-03/30/15   colon/rectal   Vitamin D deficiency    Wears glasses    Wears glasses     Past Surgical History:  Procedure Laterality Date   ABDOMINAL HYSTERECTOMY  1996   uterus and cervix   BACK SURGERY  02/12/2018   lumbar surgery   COLON RESECTION N/A 07/12/2016   Procedure: LAPAROSCOPIC LYSIS OF ADHESIONS, OMENTOPEXY, HAND ASSISTED RESECTION OF  COLON, END TO END ANASTOMOSIS, COLOSTOMY;  Surgeon: [name], MD;  Location: [facility];  Service: General;  Laterality: N/A;   COMBINED HYSTEROSCOPY DIAGNOSTIC / D&C  x2 1990's   CYSTOSCOPY W/ URETERAL STENT PLACEMENT Bilateral 02/16/2023   Procedure: CYSTOSCOPY WITH RETROGRADE PYELOGRAM BILATERAL STENT EXCHANGE;  Surgeon: Joline Maxcy, MD;  Location: [facility];  Service: Urology;  Laterality: Bilateral;   DIAGNOSTIC LAPAROSCOPY  age 43 and age 29   EUS N/A 01/18/2015   Procedure: LOWER ENDOSCOPIC ULTRASOUND  (EUS);  Surgeon: Willis Modena, MD;  Location: [facility];  Service: Endoscopy;  Laterality: N/A;   EXCISION OF SKIN TAG  11/17/2015   Procedure: EXCISION OF SKIN TAG;  Surgeon: [name], MD;  Location: [facility];  Service: General;;   ILEO LOOP COLOSTOMY CLOSURE N/A 11/17/2015   Procedure: LAPAROSCOPIC DIVERTING LOOP ILEOSTOMY  DRAINAGE OF PELVIC ABSCESS;  Surgeon: [name], MD;  Location: [facility];  Service: General;  Laterality: N/A;   ILEOSTOMY CLOSURE N/A 05/31/2016   Procedure: TAKEDOWN LOOP ILEOSTOMY;  Surgeon: [name], MD;  Location: [facility];  Service: General;  Laterality: N/A;   IMPACTION REMOVAL  11/17/2015   Procedure: DISIMPACTION REMOVAL;  Surgeon: [name], MD;  Location: [facility];  Service: General;;   INSERTION OF MESH N/A 07/23/2018   Procedure: INSERTION OF MESH X2;  Surgeon: [name], MD;  Location: [facility];  Service: General;  Laterality: N/A;   IR LUMBAR DISC ASPIRATION W/IMG GUIDE  07/01/2019   KNEE ARTHROSCOPY Left 1990's   KNEE ARTHROSCOPY W/ MENISCECTOMY Left 09/14/2009   and chondroplasty debridement   LAPAROSCOPIC LYSIS OF ADHESIONS  11/17/2015   Procedure: LAPAROSCOPIC LYSIS OF ADHESIONS;  Surgeon: [name], MD;  Location: [facility];  Service: General;;   LUMBAR WOUND DEBRIDEMENT N/A 04/24/2018   Procedure: wound exploration, irrigation and debridement;  Surgeon: Tia Alert, MD;  Location: [facility] OR;  Service: Neurosurgery;  Laterality: N/A;  wound exploration, irrigation and debridement   LYSIS OF ADHESION N/A 07/23/2018   Procedure: LYSIS OF ADHESIONS;  Surgeon: [name], MD;  Location: [facility];  Service: General;  Laterality: N/A;   PORT-A-CATH REMOVAL N/A 11/21/2016   Procedure: REMOVAL PORT-A-CATH;  Surgeon: [name], MD;  Location: [facility];  Service: General;  Laterality: N/A;   PORTACATH PLACEMENT N/A 05/25/2015   Procedure: INSERTION PORT-A-CATH;  Surgeon: [name], MD;  Location: [facility];  Service: General;  Laterality:  N/A;-remains inplace Right chest.   ROTATOR CUFF REPAIR Right 2006   TONSILLECTOMY  age 49   VENTRAL HERNIA REPAIR N/A 07/23/2018   Procedure: LAPAROSCOPIC VENTRAL WALL HERNIA REPAIR;  Surgeon: [name], MD;  Location: [facility];  Service: General;  Laterality: N/A;   XI ROBOTIC ASSISTED LOWER ANTERIOR RESECTION N/A 05/25/2015   Procedure: XI ROBOTIC ASSISTED LOWER ANTERIOR RESECTION, , RIGID PROCTOSCOPY, RIGHT OOPHORECTOMY;  Surgeon: [name], MD;  Location: [facility];  Service: General;  Laterality: N/A;    Social History:  reports that she quit smoking about 31 years ago. Her smoking use included cigarettes. She started smoking about 38 years ago. She has a 1.8 pack-year smoking history. She has never used smokeless tobacco. She reports that she does not drink alcohol and does not use drugs.  Allergies  Allergen Reactions   Lidocaine Swelling, Rash and Other (See Comments)    Eyes swell shut; includes all -caine drugs except marcaine. EMLA cream OK though   Bupropion Other (See Comments)    "Not effective"   Metronidazole Nausea And Vomiting and Other (See Comments)    GI Intolerance and sleeplessness   Oxycodone Other (See Comments)    NIGHTMARES. (tolerates hydrocodone or tramadol better)   Penicillins Nausea Only and Rash   Sulfa Antibiotics Nausea And Vomiting and Rash   Pantoprazole Other (See Comments)    "not effective"   Adhesive [Tape] Rash and Other (See Comments)    Blisters - can use paper tape   Doxycycline Nausea Only and Other (See Comments)    GI Intolerance   Iron Nausea Only and Other (See Comments)    GI Intolerance   Metoprolol Nausea Only and Palpitations   Wound Dressing Adhesive Rash and Other (See Comments)    Blisters - can use paper tape    Family History  Problem Relation Age of Onset   Coronary artery disease Mother 2   Hypertension Mother    Hyperlipidemia Mother    Diabetes Mellitus I Mother    Coronary artery disease Father     Hyperlipidemia Father    Hypertension Father    Cancer Sister        skin - non melanoma   Hyperlipidemia Brother    Cancer Maternal Uncle 60       pancreatic with mets to colon and prostate   Cirrhosis Maternal Uncle    Hypertension Maternal Grandmother    Diabetes Mellitus I Maternal Grandmother    Hyperlipidemia Maternal Grandmother    CVA Maternal Grandmother    Hypertension Maternal Grandfather    Coronary artery disease Maternal Grandfather 38   Hyperlipidemia Maternal Grandfather    Coronary artery disease Paternal  Grandmother    Hypertension Paternal Grandmother    Hyperlipidemia Paternal Grandmother    Diabetes Mellitus I Paternal Grandmother    Hypertension Paternal Grandfather    Hyperlipidemia Paternal Grandfather    Coronary artery disease Paternal Grandfather      Prior to Admission medications   Medication Sig Start Date End Date Taking? Authorizing Provider  bisoprolol (ZEBETA) 5 MG tablet Take 5 mg by mouth daily.    [provider]  Coenzyme Q10 (COQ10) 200 MG CAPS Take 200 mg by mouth at bedtime.    [provider]  DULoxetine (CYMBALTA) 60 MG capsule Take 120 mg by mouth every evening.    [provider]  esomeprazole (NEXIUM) 40 MG capsule Take 1 capsule (40 mg total) by mouth 2 (two) times daily before a meal. Patient taking differently: Take 40 mg by mouth daily. May take an additional 40 mg as needed for reflux 12/22/15   [name], MD  furosemide (LASIX) 20 MG tablet Take 20 mg by mouth daily as needed for edema.     [provider]  HYDROcodone-acetaminophen (NORCO) 7.5-325 MG tablet Take 1 tablet by mouth 2 (two) times daily.    [provider]  HYDROmorphone (DILAUDID) 4 MG tablet Take one tablet (4 mg dose) by mouth every 6 (six) hours as needed for Pain for up to 30 days. Max Daily Amount: 16 mg 05/13/23     hyoscyamine (LEVSIN) 0.125 MG tablet Take 0.125 mg by mouth 3 (three) times daily as needed for  cramping. 01/28/23   [provider]  levETIRAcetam (KEPPRA) 250 MG tablet Take 500 mg by mouth 2 (two) times daily.    [provider]  levothyroxine (SYNTHROID) 50 MCG tablet Take 50 mcg by mouth every morning. 02/07/22   [provider]  Melatonin 3 MG TABS Take 3-6 mg by mouth at bedtime as needed (sleep).    [provider]  naloxone [facility]) nasal spray 4 mg/0.1 mL Place 0.4 mg into the nose once. 12/18/22   [provider]  oxybutynin (DITROPAN XL) 15 MG 24 hr tablet Take 15 mg by mouth daily.    [provider]  polyethylene glycol (MIRALAX / GLYCOLAX) 17 g packet Take 17 g by mouth 2 (two) times daily.    [provider]  pregabalin (LYRICA) 225 MG capsule Take 225 mg by mouth 2 (two) times daily. 02/07/22   [provider]  promethazine (PHENERGAN) 25 MG tablet TAKE ONE TABLET BY MOUTH EVERY 6 HOURS AS NEEDED FOR NAUSEA OR VOMITING Patient taking differently: Take 25 mg by mouth as needed for nausea or vomiting. 07/04/20   Daiva Eves, Lisette Grinder, MD  rosuvastatin (CRESTOR) 5 MG tablet Take 5 mg by mouth at bedtime.    [provider]  triamcinolone lotion (KENALOG) 0.1 % Apply 1 application topically as needed (dry skin).    [provider]    Physical Exam: Vitals:   05/27/23 1648 05/27/23 1700 05/27/23 1822 05/27/23 1936  BP: 108/75 105/65 (!) 167/80 (!) 114/56  Pulse: 78 82 80 71  Resp: 14 19 18 20   Temp: 98.5 F (36.9 C)   98.4 F (36.9 C)  TempSrc: Oral   Oral  SpO2: 95% 96% 98% 95%  Weight:      Height:       Constitutional: Resting in bed with head elevated, NAD, calm, comfortable Eyes: EOMI, lids and conjunctivae normal ENMT: Mucous membranes are moist. Posterior pharynx clear of any exudate  or lesions.Normal dentition.  Neck: normal, supple, no masses. Respiratory: clear to auscultation bilaterally, no wheezing, no crackles. Normal respiratory effort. No accessory muscle use.   Cardiovascular: Regular rate and rhythm, no murmurs / rubs / gallops. No extremity edema. 2+ pedal pulses. Abdomen: Right-sided abdominal tenderness, colostomy in place left abdomen. Musculoskeletal: no clubbing / cyanosis. No joint deformity upper and lower extremities. Good ROM, no contractures. Normal muscle tone.  Skin: no rashes, lesions, ulcers. No induration Neurologic: Sensation intact. Strength 5/5 in all 4.  Psychiatric: Normal judgment and insight. Alert and oriented x 3. Normal mood.   EKG: Personally reviewed. Sinus rhythm, rate 76, no acute ischemic changes, low voltage.  Not significantly changed when compared to prior.  Assessment/Plan Principal Problem:   Acute right flank pain Active Problems:   Bilateral hydronephrosis   Rectal adenocarcinoma s/p LAR resection 05/25/2015   Essential hypertension   Colostomy in place [facility])   Chronic pain syndrome   Acquired hypothyroidism   Hyperlipidemia   Seizure disorder (HCC)   Lindsay Little is a 57 y.o. female with medical history significant for colorectal cancer s/p surgical resection with diverting colostomy, rectivaginal fistula s/p repair, chronic obstructive uropathy with bilateral hydronephrosis s/p multiple urological intervention most recently bilateral ureteral stent exchange 04/18/2023, HTN, HLD, hypothyroidism, seizure disorder, chronic pain syndrome who is admitted for flank pain in setting of worsening bilateral hydronephrosis.  Assessment and Plan: Right flank pain Chronic bilateral hydronephrosis s/p ureteral stenting: Presenting with acute onset severe right flank pain.  Patient requires ureteral stent exchange every 3 months, last exchanged 04/18/2023.  CT showed stents in place, worsening bilateral hydronephrosis, severe on the right and moderate on the left.  UA with large leukocytes, >50 RBCs/WBCs, no bacteria. -Urology to follow -Antibiotics held, follow urine culture -Keep n.p.o. after midnight -Pain  control with home oral Dilaudid as well as IV Dilaudid for severe breakthrough pain  Colorectal cancer s/p resection with permanent diverting colostomy Chronic postoperative lower pelvis/presacral fluid collection: Lower pelvis/presacral space fluid collection is slightly increased otherwise overall stable from this perspective.  No sign of bowel obstruction. -Continue ostomy care  Hypertension: Continue bisoprolol.  Seizure disorder: Continue Keppra.  Hypothyroidism: Continue Synthroid.  Chronic pain syndrome/neuropathy: Continue Lyrica, Cymbalta, home oral Dilaudid 4 mg q6h prn.  Hyperlipidemia: Continue rosuvastatin.   DVT prophylaxis: SCDs Start: 05/27/23 1938 Code Status: Full code, confirmed with patient on admission Family Communication: Discussed with patient, she has discussed with family Disposition Plan: From home, dispo pending clinical progress Consults called: Urology Severity of Illness: The appropriate patient status for this patient is OBSERVATION. Observation status is judged to be reasonable and necessary in order to provide the required intensity of service to ensure the patient's safety. The patient's presenting symptoms, physical exam findings, and initial radiographic and laboratory data in the context of their medical condition is felt to place them at decreased risk for further clinical deterioration. Furthermore, it is anticipated that the patient will be medically stable for discharge from the hospital within 2 midnights of admission.   Darreld Mclean MD [facility]  If 7PM-7AM, please contact night-coverage [email or web address]  05/27/2023, 7:52 PM

## 2023-05-27 NOTE — ED Triage Notes (Addendum)
Pt to ED POV reporting sudden onset of right sided flank pain at 4am this morning. Pt reporting the pain woke her from sleeping. Hx of hydronephrosis and stent placement. Pt also has an ostomy on left side of abd. Pt reports no new changes with ostomy, no NVD.

## 2023-05-27 NOTE — ED Provider Notes (Signed)
[place] EMERGENCY DEPARTMENT AT [facility] Provider Note   CSN: [id number] Arrival date & time: 05/27/23  0857     History  Chief Complaint  Patient presents with   Flank Pain    Lindsay Little is a 57 y.o. female with PMHx neuropathy, GERD, HTN, HLD, IBS, chronic abdominal pain, OA, anemia, rectal adenocarcinoma s/p ostomy who presents to ED concerned for right flank pain x5 hours. Patient does have a vast list of chronic symptoms that she has been following with multiple outpatient specialists for - her biggest concern today is the flank pain. This pain awoke patient from sleeping at [facility]. Patient endorses hx of hydronephrosis and stent placement around 5 weeks ago. Patient is endorsing that this pain feels similar to symptoms she had when stent became misplaced around 5 weeks ago. No concern for change in stool frequency/melena in ostomy bag. Patient unsure if she is having hematuria or vaginal bleeding, but states that a provider recently performed a pelvic exam on patient which did not appreciate any vaginal bleeding.  Patient is on oral dilaudid for pain control.  Denies fever, chest pain, dyspnea, cough, vomiting, diarrhea, dysuria.   Flank Pain       Home Medications Prior to Admission medications   Medication Sig Start Date End Date Taking? Authorizing Provider  bisoprolol (ZEBETA) 5 MG tablet Take 5 mg by mouth daily.    [provider]  Coenzyme Q10 (COQ10) 200 MG CAPS Take 200 mg by mouth at bedtime.    [provider]  DULoxetine (CYMBALTA) 60 MG capsule Take 120 mg by mouth every evening.    [provider]  esomeprazole (NEXIUM) 40 MG capsule Take 1 capsule (40 mg total) by mouth 2 (two) times daily before a meal. Patient taking differently: Take 40 mg by mouth daily. May take an additional 40 mg as needed for reflux 12/22/15   [name], MD  furosemide (LASIX) 20 MG tablet Take 20 mg by mouth daily as needed for edema.      [provider]  HYDROcodone-acetaminophen (NORCO) 7.5-325 MG tablet Take 1 tablet by mouth 2 (two) times daily.    [provider]  HYDROmorphone (DILAUDID) 4 MG tablet Take one tablet (4 mg dose) by mouth every 6 (six) hours as needed for Pain for up to 30 days. Max Daily Amount: 16 mg 05/13/23     hyoscyamine (LEVSIN) 0.125 MG tablet Take 0.125 mg by mouth 3 (three) times daily as needed for cramping. 01/28/23   [provider]  levETIRAcetam (KEPPRA) 250 MG tablet Take 500 mg by mouth 2 (two) times daily.    [provider]  levothyroxine (SYNTHROID) 50 MCG tablet Take 50 mcg by mouth every morning. 02/07/22   [provider]  Melatonin 3 MG TABS Take 3-6 mg by mouth at bedtime as needed (sleep).    [provider]  naloxone [facility]) nasal spray 4 mg/0.1 mL Place 0.4 mg into the nose once. 12/18/22   [provider]  oxybutynin (DITROPAN XL) 15 MG 24 hr tablet Take 15 mg by mouth daily.    [provider]  polyethylene glycol (MIRALAX / GLYCOLAX) 17 g packet Take 17 g by mouth 2 (two) times daily.    [provider]  pregabalin (LYRICA) 225 MG capsule Take 225 mg by mouth 2 (two) times daily. 02/07/22   [provider]  promethazine (PHENERGAN) 25 MG tablet TAKE ONE TABLET BY MOUTH EVERY 6 HOURS AS NEEDED  FOR NAUSEA OR VOMITING Patient taking differently: Take 25 mg by mouth as needed for nausea or vomiting. 07/04/20   Daiva Eves, Lisette Grinder, MD  rosuvastatin (CRESTOR) 5 MG tablet Take 5 mg by mouth at bedtime.    [provider]  triamcinolone lotion (KENALOG) 0.1 % Apply 1 application topically as needed (dry skin).    [provider]      Allergies    Caine-1 [lidocaine], Bupropion, Sulfa antibiotics, Pantoprazole, Adhesive [tape], Doxycycline, Flagyl [metronidazole], Iron, Metoprolol, Oxycodone, and Penicillins    Review of Systems   Review of Systems  Genitourinary:  Positive for  flank pain.    Physical Exam Updated Vital Signs BP 110/75 (BP Location: Right Arm)   Pulse 78   Temp 98.7 F (37.1 C) (Oral)   Resp 16   Ht 5\' 11"  (1.803 m)   Wt 127.9 kg   SpO2 92%   BMI 39.33 kg/m  Physical Exam Vitals and nursing note reviewed.  Constitutional:      General: She is not in acute distress.    Appearance: She is not ill-appearing or toxic-appearing.  HENT:     Head: Normocephalic and atraumatic.     Mouth/Throat:     Mouth: Mucous membranes are moist.  Eyes:     General: No scleral icterus.       Right eye: No discharge.        Left eye: No discharge.     Conjunctiva/sclera: Conjunctivae normal.  Cardiovascular:     Rate and Rhythm: Normal rate and regular rhythm.     Pulses: Normal pulses.     Heart sounds: Normal heart sounds. No murmur heard. Pulmonary:     Effort: Pulmonary effort is normal. No respiratory distress.     Breath sounds: Normal breath sounds. No wheezing, rhonchi or rales.  Abdominal:     General: Abdomen is flat. Bowel sounds are normal. There is no distension.     Palpations: Abdomen is soft. There is no mass.     Tenderness: There is abdominal tenderness.     Comments: Left ostomy bag appears intact. Tenderness to palpation of right flank area.   Musculoskeletal:     Right lower leg: No edema.     Left lower leg: No edema.  Skin:    General: Skin is warm and dry.     Findings: No rash.  Neurological:     General: No focal deficit present.     Mental Status: She is alert. Mental status is at baseline.  Psychiatric:        Mood and Affect: Mood normal.        Behavior: Behavior normal.     ED Results / Procedures / Treatments   Labs (all labs ordered are listed, but only abnormal results are displayed) Labs Reviewed  COMPREHENSIVE METABOLIC PANEL - Abnormal; Notable for the following components:      Result Value   Glucose, Bld 113 (*)    Creatinine, Ser 1.09 (*)    Calcium 10.5 (*)    Total Protein 8.3 (*)    AST  9 (*)    GFR, Estimated 60 (*)    All other components within normal limits  CBC - Abnormal; Notable for the following components:   Hemoglobin 11.5 (*)    MCH 25.0 (*)    RDW 16.1 (*)    All other components within normal limits  URINALYSIS, ROUTINE W REFLEX MICROSCOPIC - Abnormal; Notable for the following components:  APPearance HAZY (*)    Hgb urine dipstick LARGE (*)    Protein, ur 100 (*)    Leukocytes,Ua LARGE (*)    All other components within normal limits  URINE CULTURE  LIPASE, BLOOD    EKG EKG Interpretation Date/Time:  Tuesday May 27 2023 09:09:26 EST Ventricular Rate:  76 PR Interval:  190 QRS Duration:  108 QT Interval:  379 QTC Calculation: 427 R Axis:   14  Text Interpretation: Sinus rhythm Probable left atrial enlargement Low voltage, precordial leads Consider anterior infarct No acute changes No significant change since last tracing Confirmed by Derwood Kaplan [phone]) on 05/27/2023 12:47:09 PM  Radiology CT ABDOMEN PELVIS W CONTRAST  Result Date: 05/27/2023 CLINICAL DATA:  Right abdominal pain, flank pain EXAM: CT ABDOMEN AND PELVIS WITH CONTRAST TECHNIQUE: Multidetector CT imaging of the abdomen and pelvis was performed using the standard protocol following bolus administration of intravenous contrast. RADIATION DOSE REDUCTION: This exam was performed according to the departmental dose-optimization program which includes automated exposure control, adjustment of the mA and/or kV according to patient size and/or use of iterative reconstruction technique. CONTRAST:  OMNIPAQUE IOHEXOL 300 MG/ML  SOLN COMPARISON:  04/07/2023 FINDINGS: Lower chest: No acute abnormality Hepatobiliary: Insert paddle biliary Pancreas: No focal abnormality or ductal dilatation. Spleen: No focal abnormality.  Normal size. Adrenals/Urinary Tract: Adrenal glands normal. Bilateral ureteral stents are in place. Worsening bilateral hydronephrosis, severe on the right and moderate  on the left. Urinary bladder decompressed, grossly unremarkable. Stomach/Bowel: Prior partial colectomy with left abdominal colostomy. Stomach and small bowel decompressed. Vascular/Lymphatic: Aortic atherosclerosis. No evidence of aneurysm or adenopathy. Reproductive: Prior hysterectomy.  No adnexal masses. Other: Fluid collection and soft tissue thickening noted in the presacral space. The fluid collection currently measures 5.4 x 2.5 cm, compared to 4.6 x 1.7 cm previously. Musculoskeletal: No acute bony abnormality. IMPRESSION: Bilateral ureteral stents are in place. Worsening bilateral hydronephrosis, severe on the right and moderate on the left. Postoperative changes from partial left hemicolectomy with left abdominal colostomy. No bowel obstruction. Increasing postoperative fluid collection in the lower pelvis/presacral space, now 5.4 x 2.5 cm compared to 4.6 x 1.7 cm previously. Electronically Signed   By: Charlett Nose M.D.   On: 05/27/2023 13:38    Procedures Procedures    Medications Ordered in ED Medications  HYDROmorphone (DILAUDID) injection 1 mg (1 mg Intravenous Given 05/27/23 0948)  ondansetron (ZOFRAN) injection 4 mg (4 mg Intravenous Given 05/27/23 0947)  HYDROmorphone (DILAUDID) injection 1 mg (1 mg Intravenous Given 05/27/23 1116)  iohexol (OMNIPAQUE) 300 MG/ML solution 100 mL (100 mLs Intravenous Contrast Given 05/27/23 1054)  HYDROmorphone (DILAUDID) injection 1 mg (1 mg Intravenous Given 05/27/23 1349)  HYDROmorphone (DILAUDID) injection 2 mg (2 mg Intravenous Given 05/27/23 1520)    ED Course/ Medical Decision Making/ A&P                                 Medical Decision Making Amount and/or Complexity of Data Reviewed Labs: ordered. Radiology: ordered.  Risk Prescription drug management.    This patient presents to the ED for concern of abdominal pain, this involves an extensive number of treatment options, and is a complaint that carries with it a high risk of  complications and morbidity.  The differential diagnosis includes gastroenteritis, colitis, small bowel obstruction, appendicitis, cholecystitis, pancreatitis, nephrolithiasis, UTI, pyleonephritis   Co morbidities that complicate the patient evaluation  neuropathy, GERD, HTN,  HLD, IBS, chronic abdominal pain, OA, anemia, rectal adenocarcinoma s/p ostomy   Additional history obtained:  Dr. Cliffton Asters PCP   Lab Tests:  I Ordered, and personally interpreted labs.  The pertinent results include: CBC with differential: No leukocytosis.  Mild anemia with Hgb at 11.5. CMP: no concern for electrolyte abnormality; no concern for kidney/liver damage Lipase: Within normal limits UA: Large leukocytes, >50WBC/RBC, no bacteria   Imaging Studies ordered:  I ordered imaging studies including  -CT Abd/Pelvis with contrast: evaluate for structural/surgical etiology of patients' severe abdominal pain.  I independently visualized and interpreted imaging I agree with the radiologist interpretation   Cardiac Monitoring: / EKG:  The patient was maintained on a cardiac monitor.  I personally viewed and interpreted the cardiac monitored which showed an underlying rhythm of: Sinus rhythm without acute ST changes or arrhythmias    Problem List / ED Course / Critical interventions / Medication management  Admitting patient for worsening hydronephrosis PMHx HTN, HLD, IBS, chronic abdominal pain, OA, anemia, rectal adenocarcinoma s/p ostomy, BL hydronephrosis with chronic stents - last one placed 04/18/2023. Patient presented for right flank pain with sudden onset at [facility].  Physical exam with tenderness to palpation of right flank.  Patient with chronic symptoms such as nausea and abdominal pain at baseline - the only new and concerning symptom for her today is the increased right flank pain.  Patient afebrile with stable vitals. CBC with mild anemia at 11.5.  No leukocytosis.  Lipase within normal limits.  UA  with large leukocytes, no bacteria, >50RBC/WBC. Urine culture pending.  CMP with mild elevation in creatinine at 1.09.  CT abdomen pelvis showing worsening hydronephrosis that is more severe in the right and more moderate in the left.  Patient also with increasing free fluid in the pelvis since last scan. Consulted with Urologist Dr. Alvester Morin and shared pertinent imaging and lab results. Dr. Alvester Morin is reassured given patient's stents and normal kidney function panel. He recommended pain control and follow up with outpatient urologist. If pain is not controlled, patient may need inpatient treatment for stent replacement. I appreciate their help. Also asked Dr. Alvester Morin about ABX for patient's UA. It was decided to hold ABX for now and await urine culture. I provided patient with multiple rounds of Dilaudid in ED and one final dose of 2mg  Dilaudid which did not resolved pain. Secure messaged Dr. Alvester Morin who agrees to follow with patient in ED. Consulted with Dr. Rito Ehrlich who agrees to admit patient. I have reviewed the patients home medicines and have made adjustments as needed  Ddx: These are considered less likely due to history of present illness and physical exam. -gastroenteritis: No vomiting in ED; no fever; tolerating PO intake -colitis: Denies diarrhea, patient afebrile  -appendicitis: Negative McBurney point, rebound tenderness, psoas, obturator sign  -cholecystitis: Negative Murphy sign; liver enzymes within normal limits  -pancreatitis: No LUQ tenderness to palpation, lipase within normal limits    Social Determinants of Health:  none           Final Clinical Impression(s) / ED Diagnoses Final diagnoses:  Hydronephrosis with ureteral stricture, not elsewhere classified    Rx / DC Orders ED Discharge Orders     None         Dorthy Cooler, [place] 05/27/23 1637    Derwood Kaplan, MD 05/28/23 [phone]

## 2023-05-27 NOTE — ED Notes (Signed)
[facility] at bedside 

## 2023-05-28 ENCOUNTER — Encounter (HOSPITAL_COMMUNITY)
Admission: EM | Disposition: A | Discharge status: Home / Self Care | Payer: Self-pay | Source: Home / Self Care | Attending: Emergency Medicine

## 2023-05-28 ENCOUNTER — Encounter (HOSPITAL_COMMUNITY): Payer: Self-pay | Admitting: Internal Medicine

## 2023-05-28 ENCOUNTER — Observation Stay (HOSPITAL_COMMUNITY): Payer: Medicare HMO

## 2023-05-28 ENCOUNTER — Observation Stay (HOSPITAL_COMMUNITY): Payer: Medicare HMO | Admitting: Certified Registered"

## 2023-05-28 ENCOUNTER — Other Ambulatory Visit: Payer: Self-pay

## 2023-05-28 ENCOUNTER — Other Ambulatory Visit: Payer: Self-pay | Admitting: Urology

## 2023-05-28 DIAGNOSIS — E785 Hyperlipidemia, unspecified: Secondary | ICD-10-CM | POA: Diagnosis not present

## 2023-05-28 DIAGNOSIS — E039 Hypothyroidism, unspecified: Secondary | ICD-10-CM | POA: Diagnosis not present

## 2023-05-28 DIAGNOSIS — G894 Chronic pain syndrome: Secondary | ICD-10-CM | POA: Diagnosis not present

## 2023-05-28 DIAGNOSIS — I10 Essential (primary) hypertension: Secondary | ICD-10-CM | POA: Diagnosis not present

## 2023-05-28 DIAGNOSIS — Z466 Encounter for fitting and adjustment of urinary device: Secondary | ICD-10-CM

## 2023-05-28 DIAGNOSIS — N132 Hydronephrosis with renal and ureteral calculous obstruction: Secondary | ICD-10-CM | POA: Diagnosis not present

## 2023-05-28 DIAGNOSIS — R109 Unspecified abdominal pain: Secondary | ICD-10-CM | POA: Diagnosis not present

## 2023-05-28 DIAGNOSIS — N1330 Unspecified hydronephrosis: Secondary | ICD-10-CM | POA: Diagnosis not present

## 2023-05-28 DIAGNOSIS — E782 Mixed hyperlipidemia: Secondary | ICD-10-CM | POA: Diagnosis not present

## 2023-05-28 HISTORY — PX: CYSTOSCOPY WITH STENT PLACEMENT: SHX5790

## 2023-05-28 LAB — BASIC METABOLIC PANEL
Anion gap: 7 (ref 5–15)
BUN: 20 mg/dL (ref 6–20)
CO2: 24 mmol/L (ref 22–32)
Calcium: 9.2 mg/dL (ref 8.9–10.3)
Chloride: 104 mmol/L (ref 98–111)
Creatinine, Ser: 0.84 mg/dL (ref 0.44–1.00)
GFR, Estimated: >60 mL/min (ref >60)
Glucose, Bld: 105 mg/dL — ABNORMAL HIGH (ref 70–99)
Potassium: 4.3 mmol/L (ref 3.5–5.1)
Sodium: 135 mmol/L (ref 135–145)

## 2023-05-28 LAB — CBC
HCT: 36.7 % (ref 36.0–46.0)
Hemoglobin: 10.9 g/dL — ABNORMAL LOW (ref 12.0–15.0)
MCH: 24.9 pg — ABNORMAL LOW (ref 26.0–34.0)
MCHC: 29.7 g/dL — ABNORMAL LOW (ref 30.0–36.0)
MCV: 83.8 fL (ref 80.0–100.0)
Platelets: 261 10*3/uL (ref 150–400)
RBC: 4.38 MIL/uL (ref 3.87–5.11)
RDW: 15.9 % — ABNORMAL HIGH (ref 11.5–15.5)
WBC: 7.6 10*3/uL (ref 4.0–10.5)
nRBC: 0 % (ref 0.0–0.2)

## 2023-05-28 LAB — HIV ANTIBODY (ROUTINE TESTING W REFLEX): HIV Screen 4th Generation wRfx: NONREACTIVE

## 2023-05-28 LAB — URINE CULTURE: Culture: 30000 — AB

## 2023-05-28 SURGERY — CYSTOSCOPY, WITH STENT INSERTION
Anesthesia: General | Laterality: Bilateral

## 2023-05-28 MED ORDER — LIDOCAINE HCL (PF) 2 % IJ SOLN
INTRAMUSCULAR | Status: AC
Start: 2023-05-28 — End: ?
  Filled 2023-05-28: qty 5

## 2023-05-28 MED ORDER — SODIUM CHLORIDE 0.9 % IR SOLN
Status: DC | PRN
  Administered 2023-05-28: 3000 mL via INTRAVESICAL

## 2023-05-28 MED ORDER — ACETAMINOPHEN 325 MG PO TABS: 325.0000 mg | ORAL_TABLET | ORAL | Status: DC | PRN

## 2023-05-28 MED ORDER — SCOPOLAMINE 1 MG/3DAYS TD PT72
1.0000 | MEDICATED_PATCH | TRANSDERMAL | Status: DC
  Administered 2023-05-28: 1.5 mg via TRANSDERMAL
  Filled 2023-05-28: qty 1

## 2023-05-28 MED ORDER — KETOROLAC TROMETHAMINE 30 MG/ML IJ SOLN
INTRAMUSCULAR | Status: DC | PRN
  Administered 2023-05-28: 30 mg via INTRAVENOUS

## 2023-05-28 MED ORDER — FENTANYL CITRATE (PF) 100 MCG/2ML IJ SOLN
INTRAMUSCULAR | Status: AC
  Filled 2023-05-28: qty 2

## 2023-05-28 MED ORDER — PROPOFOL 10 MG/ML IV BOLUS
INTRAVENOUS | Status: AC
Start: 2023-05-28 — End: ?
  Filled 2023-05-28: qty 20

## 2023-05-28 MED ORDER — ONDANSETRON HCL 4 MG/2ML IJ SOLN: 4.0000 mg | Freq: Once | INTRAMUSCULAR | Status: DC | PRN

## 2023-05-28 MED ORDER — CEFAZOLIN SODIUM 1 G IJ SOLR
INTRAMUSCULAR | Status: AC
  Filled 2023-05-28: qty 10

## 2023-05-28 MED ORDER — HYDROMORPHONE HCL 1 MG/ML IJ SOLN
1.0000 mg | INTRAMUSCULAR | Status: AC | PRN
  Administered 2023-05-28 – 2023-05-29 (×4): 1 mg via INTRAVENOUS
  Filled 2023-05-28 (×4): qty 1

## 2023-05-28 MED ORDER — PROPOFOL 10 MG/ML IV BOLUS
INTRAVENOUS | Status: DC | PRN
  Administered 2023-05-28: 200 mg via INTRAVENOUS

## 2023-05-28 MED ORDER — ONDANSETRON HCL 4 MG/2ML IJ SOLN
INTRAMUSCULAR | Status: AC
  Filled 2023-05-28: qty 2

## 2023-05-28 MED ORDER — OXYCODONE HCL 5 MG/5ML PO SOLN
5.0000 mg | Freq: Once | ORAL | Status: DC | PRN
Start: 2023-05-28 — End: 2023-05-28

## 2023-05-28 MED ORDER — LIDOCAINE HCL (CARDIAC) PF 100 MG/5ML IV SOSY: PREFILLED_SYRINGE | INTRAVENOUS | Status: DC | PRN

## 2023-05-28 MED ORDER — CEFAZOLIN SODIUM 1 G IJ SOLR
INTRAMUSCULAR | Status: AC
Start: 2023-05-28 — End: ?
  Filled 2023-05-28: qty 10

## 2023-05-28 MED ORDER — CEFAZOLIN SODIUM-DEXTROSE 2-3 GM-%(50ML) IV SOLR
INTRAVENOUS | Status: DC | PRN
  Administered 2023-05-28: 2 g via INTRAVENOUS

## 2023-05-28 MED ORDER — MIDAZOLAM HCL 2 MG/2ML IJ SOLN
INTRAMUSCULAR | Status: AC
  Filled 2023-05-28: qty 2

## 2023-05-28 MED ORDER — TAMSULOSIN HCL 0.4 MG PO CAPS
0.4000 mg | ORAL_CAPSULE | Freq: Every day | ORAL | Status: DC
  Administered 2023-05-28 – 2023-05-29 (×2): 0.4 mg via ORAL
  Filled 2023-05-28 (×2): qty 1

## 2023-05-28 MED ORDER — DEXAMETHASONE SODIUM PHOSPHATE 10 MG/ML IJ SOLN
INTRAMUSCULAR | Status: DC | PRN
  Administered 2023-05-28: 5 mg via INTRAVENOUS

## 2023-05-28 MED ORDER — HYDROMORPHONE HCL 1 MG/ML IJ SOLN
0.5000 mg | INTRAMUSCULAR | Status: AC | PRN
  Administered 2023-05-28: 0.5 mg via INTRAVENOUS
  Filled 2023-05-28 (×2): qty 0.5

## 2023-05-28 MED ORDER — IOHEXOL 300 MG/ML  SOLN
INTRAMUSCULAR | Status: DC | PRN
  Administered 2023-05-28: 10 mL

## 2023-05-28 MED ORDER — HYDROMORPHONE HCL 2 MG PO TABS
4.0000 mg | ORAL_TABLET | Freq: Four times a day (QID) | ORAL | Status: DC | PRN
  Administered 2023-05-29: 4 mg via ORAL
  Filled 2023-05-28: qty 2

## 2023-05-28 MED ORDER — FENTANYL CITRATE PF 50 MCG/ML IJ SOSY
25.0000 ug | PREFILLED_SYRINGE | INTRAMUSCULAR | Status: DC | PRN
  Administered 2023-05-28: 25 ug via INTRAVENOUS

## 2023-05-28 MED ORDER — PANTOPRAZOLE SODIUM 40 MG PO TBEC
40.0000 mg | DELAYED_RELEASE_TABLET | Freq: Every day | ORAL | Status: DC
  Administered 2023-05-28 – 2023-05-29 (×2): 40 mg via ORAL
  Filled 2023-05-28 (×2): qty 1

## 2023-05-28 MED ORDER — FENTANYL CITRATE (PF) 100 MCG/2ML IJ SOLN
INTRAMUSCULAR | Status: DC | PRN
  Administered 2023-05-28: 50 ug via INTRAVENOUS
  Administered 2023-05-28 (×2): 25 ug via INTRAVENOUS

## 2023-05-28 MED ORDER — MEPERIDINE HCL 50 MG/ML IJ SOLN: 6.2500 mg | INTRAMUSCULAR | Status: DC | PRN

## 2023-05-28 MED ORDER — FENTANYL CITRATE PF 50 MCG/ML IJ SOSY
PREFILLED_SYRINGE | INTRAMUSCULAR | Status: AC
  Administered 2023-05-28: 25 ug via INTRAVENOUS
  Filled 2023-05-28: qty 1

## 2023-05-28 MED ORDER — DEXAMETHASONE SODIUM PHOSPHATE 10 MG/ML IJ SOLN
INTRAMUSCULAR | Status: AC
  Filled 2023-05-28: qty 1

## 2023-05-28 MED ORDER — LACTATED RINGERS IV SOLN: INTRAVENOUS | Status: DC | PRN

## 2023-05-28 MED ORDER — OXYCODONE HCL 5 MG PO TABS: 5.0000 mg | ORAL_TABLET | Freq: Once | ORAL | Status: DC | PRN

## 2023-05-28 MED ORDER — KETOROLAC TROMETHAMINE 30 MG/ML IJ SOLN
INTRAMUSCULAR | Status: AC
Start: 2023-05-28 — End: ?
  Filled 2023-05-28: qty 1

## 2023-05-28 MED ORDER — ACETAMINOPHEN 160 MG/5ML PO SOLN
325.0000 mg | ORAL | Status: DC | PRN
Start: 2023-05-28 — End: 2023-05-28

## 2023-05-28 MED ORDER — BISOPROLOL FUMARATE 5 MG PO TABS
5.0000 mg | ORAL_TABLET | Freq: Every day | ORAL | Status: DC
  Administered 2023-05-28 – 2023-05-29 (×2): 5 mg via ORAL
  Filled 2023-05-28 (×2): qty 1

## 2023-05-28 MED ORDER — ONDANSETRON HCL 4 MG/2ML IJ SOLN
INTRAMUSCULAR | Status: DC | PRN
  Administered 2023-05-28: 4 mg via INTRAVENOUS

## 2023-05-28 SURGICAL SUPPLY — 15 items
BAG URO CATCHER STRL LF (MISCELLANEOUS) ×1 IMPLANT
CATH URETERAL DUAL LUMEN 10F (MISCELLANEOUS) IMPLANT
CATH URETL OPEN END 6FR 70 (CATHETERS) ×1 IMPLANT
CLOTH BEACON ORANGE TIMEOUT ST (SAFETY) ×1 IMPLANT
GLOVE BIO SURGEON STRL SZ7.5 (GLOVE) ×1 IMPLANT
GOWN STRL REUS W/ TWL XL LVL3 (GOWN DISPOSABLE) ×1 IMPLANT
GOWN STRL REUS W/TWL XL LVL3 (GOWN DISPOSABLE) ×1
GUIDEWIRE STR DUAL SENSOR (WIRE) ×1 IMPLANT
KIT TURNOVER KIT A (KITS) IMPLANT
MANIFOLD NEPTUNE II (INSTRUMENTS) ×1 IMPLANT
PACK CYSTO (CUSTOM PROCEDURE TRAY) ×1 IMPLANT
PAD PREP 24X48 CUFFED NSTRL (MISCELLANEOUS) ×1 IMPLANT
STENT CONTOUR NO GW 8FR 26CM (STENTS) IMPLANT
TUBING CONNECTING 10 (TUBING) ×1 IMPLANT
TUBING UROLOGY SET (TUBING) IMPLANT

## 2023-05-28 NOTE — Anesthesia Preprocedure Evaluation (Addendum)
Anesthesia Evaluation  Patient identified by MRN, date of birth, ID band Patient awake    Reviewed: Allergy & Precautions, NPO status , Patient's Chart, lab work & pertinent test results  History of Anesthesia Complications (+) PONV, Family history of anesthesia reaction and history of anesthetic complications  Airway Mallampati: III  TM Distance: >3 FB Neck ROM: Full   Comment: Previous grade I view with MAC 3 Dental  (+) Dental Advisory Given,    Pulmonary neg shortness of breath, sleep apnea (mild, does not require CPAP) , neg COPD, Recent URI  (4 weeks ago), Resolved, former smoker   Pulmonary exam normal breath sounds clear to auscultation       Cardiovascular hypertension, (-) angina (-) Past MI, (-) Cardiac Stents and (-) CABG (-) dysrhythmias + Valvular Problems/Murmurs (murmur as a child)  Rhythm:Regular Rate:Normal  HLD  TTE 02/02/2018: Impressions:   - LVEF 50-55%, mild LVH, normal wall motion, grade 1 DD,    indeterminate LV filling pressure, trivial MR, normal LA Size,    normal IVC.     Neuro/Psych neg Seizures (reports she has never had a seizure, Keppra is for her pain) PSYCHIATRIC DISORDERS Anxiety Depression    Chronic pain (takes MS Contin 15 mg q12h), s/p lumbar fusion  Neuromuscular disease (chemo-induced neuropathy)    GI/Hepatic hiatal hernia (s/p repair),GERD  Medicated,,Liver hemangioma Rectal adenocarcinoma s/p chemo/radiation and LAR with RSO, colostomy, Crohn's disease   Endo/Other  neg diabetesHypothyroidism  Class 3 obesity  Renal/GU ARFRenal disease (ureteral stricture)     Musculoskeletal  (+) Arthritis , Osteoarthritis,    Abdominal  (+) + obese  Peds  Hematology  (+) Blood dyscrasia, anemia   Anesthesia Other Findings 57 y.o. with metastatic rectal ca and bilateral ureteral strictures/obstruction that has been managed with bilateral dj stents (Dr. Logan Bores at [facility]).     Reproductive/Obstetrics                             Anesthesia Physical Anesthesia Plan  ASA: 3  Anesthesia Plan: General   Post-op Pain Management:    Induction: Intravenous and Rapid sequence  PONV Risk Score and Plan: 4 or greater and Ondansetron, Dexamethasone, Midazolam, Treatment may vary due to age or medical condition and Scopolamine patch - Pre-op  Airway Management Planned: LMA  Additional Equipment:   Intra-op Plan:   Post-operative Plan: Extubation in OR  Informed Consent: I have reviewed the patients History and Physical, chart, labs and discussed the procedure including the risks, benefits and alternatives for the proposed anesthesia with the patient or authorized representative who has indicated his/her understanding and acceptance.     Dental advisory given  Plan Discussed with: CRNA and Anesthesiologist  Anesthesia Plan Comments: (Risks of general anesthesia discussed including, but not limited to, sore throat, hoarse voice, chipped/damaged teeth, injury to vocal cords, nausea and vomiting, allergic reactions, lung infection, heart attack, stroke, and death. All questions answered. )        Anesthesia Quick Evaluation

## 2023-05-28 NOTE — Plan of Care (Signed)
  Problem: Education: Goal: Knowledge of General Education information will improve Description: Including pain rating scale, medication(s)/side effects and non-pharmacologic comfort measures Outcome: Progressing   Problem: Clinical Measurements: Goal: Ability to maintain clinical measurements within normal limits will improve Outcome: Progressing Goal: Will remain free from infection Outcome: Progressing   Problem: Coping: Goal: Level of anxiety will decrease Outcome: Progressing   Problem: Elimination: Goal: Will not experience complications related to bowel motility Outcome: Progressing Goal: Will not experience complications related to urinary retention Outcome: Progressing   Problem: Pain Management: Goal: General experience of comfort will improve Outcome: Progressing   Problem: Safety: Goal: Ability to remain free from injury will improve Outcome: Progressing

## 2023-05-28 NOTE — Op Note (Signed)
Operative Note  Preoperative diagnosis:  1.  Bilateral ureteral obstruction with bilateral hydronephrosis  Postoperative diagnosis: 1.  Bilateral ureteral obstruction with bilateral hydronephrosis  Procedure(s): 1.  Cystoscopy with bilateral retrograde pyelogram, bilateral ureteral stent exchange/upsize  Surgeon: Modena Slater, MD  Assistants: None  Anesthesia: General  Complications: None immediate  EBL: Minimal  Specimens: 1.  None  Drains/Catheters: 1.  Bilateral 8 x 26 double-J ureteral stent  Intraoperative findings: 1.  Normal urethra and bladder 2.  Bilateral retrograde pyelogram confirmed hydronephrosis, right greater than left.  Indication: 57 year old female with a history of bilateral ureteral obstruction managed with ureteral stents presented with bilateral hydronephrosis and flank pain despite adequate placement of stents.  She presents for upsizing of stents.  Description of procedure:  The patient was identified and consent was obtained.  The patient was taken to the operating room and placed in the supine position.  The patient was placed under general anesthesia.  Perioperative antibiotics were administered.  The patient was placed in dorsal lithotomy.  Patient was prepped and draped in a standard sterile fashion and a timeout was performed.  A 21 French rigid cystoscope was advanced into the urethra and into the bladder.  Complete cystoscopy was performed with no abnormal findings.  The left stent was grasped and pulled just beyond the urethral meatus.  A wire was advanced through the stent and up to the kidney under fluoroscopic guidance and the stent was withdrawn.  A dual-lumen ureteral catheter was advanced over the wire into the distal ureter.  Retrograde pyelogram was performed with findings noted above.  This confirmed persistent hydronephrosis.  I withdrew the ureteral catheter and backloaded the wire onto a rigid cystoscope and advanced that into the  bladder followed by routine placement of an 8 x 26 double-J ureteral stent.  Fluoroscopy confirmed proximal placement and direct visualization confirmed a good coil within the bladder.  I grasped the right stent and pulled it beyond the urethral meatus.  I passed a wire through the stent and up to the kidney under fluoroscopic guidance and withdrew the stent.  I advanced a dual-lumen ureteral catheter over the wire into the distal ureter and shot a retrograde pyelogram that again confirmed right hydronephrosis.  I withdrew the ureteral catheter.  I backloaded the wire onto rigid cystoscope and advanced that into the bladder followed by routine placement of a 8 x 26 double-J ureteral stent.  Fluoroscopy confirmed proximal placement and direct visualization confirmed a good coil within the bladder.  I drained the bladder and withdrew the scope.  Patient tolerated the procedure well was stable postoperative.  Plan: Keep overnight for observation.  As long as she does well she can be discharged tomorrow.  She may follow-up with Dr. Logan Bores or myself whichever one she prefers.  She will need stent exchange in about 3 months.

## 2023-05-28 NOTE — Consult Note (Signed)
H&P Physician requesting consult: Darreld Mclean  Chief Complaint: Flank pain, bilateral hydronephrosis  History of Present Illness: 57 year old female with a history of colorectal cancer status post colectomy with diverting colostomy, rectovaginal fistula repair, and chronic bilateral ureteral obstruction/strictures that have been managed with ureteral stent exchanges every 3 months.  Typically exchanged by Dr. Logan Bores and was last performed on 10/4.  Patient presented with severe right-sided flank pain.  CT scan was performed that showed worsening bilateral hydronephrosis despite adequate stent placement.  Bladder was decompressed.  Had mild renal insufficiency from baseline with a creatinine of 1.09.  No leukocytosis.  Urinalysis without infectious parameters.  Past Medical History:  Diagnosis Date   Anxiety    Chemotherapy-induced neuropathy (HCC)    toes and fingers numbness and tingling   Colorectal delayed anastomotic leak s/p resection & colostomy 07/12/2016 11/17/2015   Colostomy in place [facility]) 07/12/2016   due to anastomosis breakdown w/ colovaginal fistula   Colovaginal fistula s/p omentopexy repair 07/12/2016 07/10/2016   Complication of anesthesia    Depression    Family history of adverse reaction to anesthesia    mother-- ponv   Family history of breast cancer    Family history of pancreatic cancer    Family history of skin cancer    Gastroenteritis 12/20/2015   GERD (gastroesophageal reflux disease)    Hardware complicating wound infection (HCC) 08/03/2019   Hiatal hernia    History of cancer chemotherapy 02-20-2015 to 03-30-2015   History of cardiac murmur as a child    History of chemotherapy    History of chronic gastritis    History of hypertension    no issues since multiple abdminal sx's and chemo--- no medication since 12/ 2016   History of TMJ disorder    Hypercholesteremia    Hypertension    IBS (irritable bowel syndrome)    dx age 32   Intermittent abdominal  pain    post-op multiple abdominal sx's    Microcytic anemia    Mild sleep apnea    per pt study 2014  very mild osa , no cpap recommended, recommeded wt loss and sleep routine   OA (osteoarthritis)    left knee /  left shoulder   Obesity    Parastomal hernia s/p lap repair w mesh 07/23/2018 07/23/2018   Pelvic abscess s/p drainage & omental pedicle flap 11/17/2015 06/23/2015   PONV (postoperative nausea and vomiting)    severe" needs Scopolamine PATCH"    Portacath in place    right chest   Rectal adenocarcinoma (HCC) oncologis-  dr Mosetta Putt-- after radiation/ chemo (ypT1, N0) --  no recurrence per last note 03/ 2018   dx 01-13-2015-- Stage IIIC (T3, N2, M0) post concurrent radiation and chemotherapy 02-20-2015 to 03-30-2015 /  05-25-2015 s/p  LAR w/ RSO (post-op complicated by late abscess and contained anatomotic leak w/ help percutaneous drainage and antibiotics)    Rectocutaneous fistula 07/10/2016   Rotator cuff tear, left    S/P radiation therapy 02/20/15-03/30/15   colon/rectal   Vitamin D deficiency    Wears glasses    Wears glasses    Past Surgical History:  Procedure Laterality Date   ABDOMINAL HYSTERECTOMY  1996   uterus and cervix   BACK SURGERY  02/12/2018   lumbar surgery   COLON RESECTION N/A 07/12/2016   Procedure: LAPAROSCOPIC LYSIS OF ADHESIONS, OMENTOPEXY, HAND ASSISTED RESECTION OF  COLON, END TO END ANASTOMOSIS, COLOSTOMY;  Surgeon: [name], MD;  Location: [facility];  Service:  General;  Laterality: N/A;   COMBINED HYSTEROSCOPY DIAGNOSTIC / D&C  x2 1990's   CYSTOSCOPY W/ URETERAL STENT PLACEMENT Bilateral 02/16/2023   Procedure: CYSTOSCOPY WITH RETROGRADE PYELOGRAM BILATERAL STENT EXCHANGE;  Surgeon: Joline Maxcy, MD;  Location: [facility];  Service: Urology;  Laterality: Bilateral;   DIAGNOSTIC LAPAROSCOPY  age 34 and age 57   EUS N/A 01/18/2015   Procedure: LOWER ENDOSCOPIC ULTRASOUND (EUS);  Surgeon: Willis Modena, MD;  Location: [facility];  Service: Endoscopy;   Laterality: N/A;   EXCISION OF SKIN TAG  11/17/2015   Procedure: EXCISION OF SKIN TAG;  Surgeon: [name], MD;  Location: [facility];  Service: General;;   ILEO LOOP COLOSTOMY CLOSURE N/A 11/17/2015   Procedure: LAPAROSCOPIC DIVERTING LOOP ILEOSTOMY  DRAINAGE OF PELVIC ABSCESS;  Surgeon: [name], MD;  Location: [facility];  Service: General;  Laterality: N/A;   ILEOSTOMY CLOSURE N/A 05/31/2016   Procedure: TAKEDOWN LOOP ILEOSTOMY;  Surgeon: [name], MD;  Location: [facility];  Service: General;  Laterality: N/A;   IMPACTION REMOVAL  11/17/2015   Procedure: DISIMPACTION REMOVAL;  Surgeon: [name], MD;  Location: [facility];  Service: General;;   INSERTION OF MESH N/A 07/23/2018   Procedure: INSERTION OF MESH X2;  Surgeon: [name], MD;  Location: [facility];  Service: General;  Laterality: N/A;   IR LUMBAR DISC ASPIRATION W/IMG GUIDE  07/01/2019   KNEE ARTHROSCOPY Left 1990's   KNEE ARTHROSCOPY W/ MENISCECTOMY Left 09/14/2009   and chondroplasty debridement   LAPAROSCOPIC LYSIS OF ADHESIONS  11/17/2015   Procedure: LAPAROSCOPIC LYSIS OF ADHESIONS;  Surgeon: [name], MD;  Location: [facility];  Service: General;;   LUMBAR WOUND DEBRIDEMENT N/A 04/24/2018   Procedure: wound exploration, irrigation and debridement;  Surgeon: Tia Alert, MD;  Location: [facility];  Service: Neurosurgery;  Laterality: N/A;  wound exploration, irrigation and debridement   LYSIS OF ADHESION N/A 07/23/2018   Procedure: LYSIS OF ADHESIONS;  Surgeon: [name], MD;  Location: [facility];  Service: General;  Laterality: N/A;   PORT-A-CATH REMOVAL N/A 11/21/2016   Procedure: REMOVAL PORT-A-CATH;  Surgeon: [name], MD;  Location: [facility] ;  Service: General;  Laterality: N/A;   PORTACATH PLACEMENT N/A 05/25/2015   Procedure: INSERTION PORT-A-CATH;  Surgeon: [name], MD;  Location[facility];  Service: General;  Laterality: N/A;-remains inplace Right chest.   ROTATOR CUFF REPAIR Right 2006   TONSILLECTOMY   age 53   VENTRAL HERNIA REPAIR N/A 07/23/2018   Procedure: LAPAROSCOPIC VENTRAL WALL HERNIA REPAIR;  Surgeon: [name], MD;  Location[facility];  Service: General;  Laterality: N/A;   XI ROBOTIC ASSISTED LOWER ANTERIOR RESECTION N/A 05/25/2015   Procedure: XI ROBOTIC ASSISTED LOWER ANTERIOR RESECTION, , RIGID PROCTOSCOPY, RIGHT OOPHORECTOMY;  Surgeon: [name], MD;  Location: [facility];  Service: General;  Laterality: N/A;    Home Medications:  Medications Prior to Admission  Medication Sig Dispense Refill Last Dose   bisoprolol (ZEBETA) 5 MG tablet Take 5 mg by mouth daily.   05/26/2023 at pm   Coenzyme Q10 (COQ10) 200 MG CAPS Take 200 mg by mouth at bedtime.   Past Month   DULoxetine (CYMBALTA) 60 MG capsule Take 120 mg by mouth at bedtime.   05/26/2023 at pm   esomeprazole (NEXIUM) 40 MG capsule Take 1 capsule (40 mg total) by mouth 2 (two) times daily before a meal. (Patient taking differently: Take 40 mg by mouth See admin instructions. Take 40 mg by mouth  in the morning before breakfast and an additional 40 mg once a day as needed for unresolved reflux) 60 capsule 5 05/26/2023 at am   famotidine (PEPCID) 20 MG tablet Take 20 mg by mouth 2 (two) times daily as needed for heartburn or indigestion.   unk   furosemide (LASIX) 20 MG tablet Take 20 mg by mouth daily as needed for edema.    unk   HYDROmorphone (DILAUDID) 4 MG tablet Take one tablet (4 mg dose) by mouth every 6 (six) hours as needed for Pain for up to 30 days. Max Daily Amount: 16 mg (Patient taking differently: Take 4 mg by mouth every 6 (six) hours.) 120 tablet 0 05/27/2023 at am   hyoscyamine (LEVSIN) 0.125 MG tablet Take 0.125 mg by mouth 3 (three) times daily as needed for cramping.   unk   Ibuprofen 200 MG CAPS Take 400 mg by mouth every 6 (six) hours as needed (for pain).   unk   levETIRAcetam (KEPPRA) 250 MG tablet Take 500 mg by mouth in the morning and at bedtime.   05/26/2023 at pm   levothyroxine (SYNTHROID) 50 MCG  tablet Take 50 mcg by mouth daily before breakfast.   05/26/2023 at am   Melatonin 3 MG TABS Take 3-6 mg by mouth at bedtime as needed (sleep).   unk   naloxone (NARCAN) nasal spray 4 mg/0.1 mL Place 0.4 mg into the nose once.      oxybutynin (DITROPAN XL) 15 MG 24 hr tablet Take 15 mg by mouth at bedtime.   05/26/2023 at pm   polyethylene glycol (MIRALAX / GLYCOLAX) 17 g packet Take 17 g by mouth See admin instructions. Mix 17 grams into the recommended amount of a beverage and drink once a day- may take an additional 17 grams once a day as needed for constipation   05/26/2023   pregabalin (LYRICA) 225 MG capsule Take 225 mg by mouth 2 (two) times daily.   05/26/2023 at pm   promethazine (PHENERGAN) 25 MG tablet TAKE ONE TABLET BY MOUTH EVERY 6 HOURS AS NEEDED FOR NAUSEA OR VOMITING (Patient taking differently: Take 25 mg by mouth every 6 (six) hours as needed for nausea or vomiting.) 60 tablet 0 unk   rosuvastatin (CRESTOR) 5 MG tablet Take 5 mg by mouth every evening.   05/26/2023 at pm   tamsulosin (FLOMAX) 0.4 MG CAPS capsule Take 0.4 mg by mouth daily.   05/26/2023   triamcinolone lotion (KENALOG) 0.1 % Apply 1 application  topically 2 (two) times daily as needed (for itching- scalp).   unk   TYLENOL 500 MG tablet Take 1,000 mg by mouth every 6 (six) hours as needed for mild pain (pain score 1-3) or headache.   unk   Allergies:  Allergies  Allergen Reactions   Lidocaine Swelling, Rash and Other (See Comments)    Eyes swell shut; includes all -caine drugs except marcaine. EMLA cream OK though   Bupropion Other (See Comments)    "Not effective"   Metronidazole Nausea And Vomiting and Other (See Comments)    GI Intolerance and sleeplessness   Oxycodone Other (See Comments)    NIGHTMARES. (tolerates hydrocodone or tramadol better)   Penicillins Nausea Only and Rash   Sulfa Antibiotics Nausea And Vomiting and Rash   Pantoprazole Other (See Comments)    "not effective"   Adhesive [Tape]  Rash and Other (See Comments)    Blisters - can use paper tape   Doxycycline Nausea Only and Other (  See Comments)    GI Intolerance   Iron Nausea Only and Other (See Comments)    GI Intolerance   Metoprolol Nausea Only and Palpitations   Wound Dressing Adhesive Rash and Other (See Comments)    Blisters - can use paper tape    Family History  Problem Relation Age of Onset   Coronary artery disease Mother 66   Hypertension Mother    Hyperlipidemia Mother    Diabetes Mellitus I Mother    Coronary artery disease Father    Hyperlipidemia Father    Hypertension Father    Cancer Sister        skin - non melanoma   Hyperlipidemia Brother    Cancer Maternal Uncle 36       pancreatic with mets to colon and prostate   Cirrhosis Maternal Uncle    Hypertension Maternal Grandmother    Diabetes Mellitus I Maternal Grandmother    Hyperlipidemia Maternal Grandmother    CVA Maternal Grandmother    Hypertension Maternal Grandfather    Coronary artery disease Maternal Grandfather 65   Hyperlipidemia Maternal Grandfather    Coronary artery disease Paternal Grandmother    Hypertension Paternal Grandmother    Hyperlipidemia Paternal Grandmother    Diabetes Mellitus I Paternal Grandmother    Hypertension Paternal Grandfather    Hyperlipidemia Paternal Grandfather    Coronary artery disease Paternal Grandfather    Social History:  reports that she quit smoking about 31 years ago. Her smoking use included cigarettes. She started smoking about 38 years ago. She has a 1.8 pack-year smoking history. She has never used smokeless tobacco. She reports that she does not drink alcohol and does not use drugs.  ROS: A complete review of systems was performed.  All systems are negative except for pertinent findings as noted. ROS   Physical Exam:  Vital signs in last 24 hours: Temp:  [98.4 F (36.9 C)-99.7 F (37.6 C)] 98.4 F (36.9 C) (11/12 1936) Pulse Rate:  [68-90] 71 (11/12 1936) Resp:  [11-23]  20 (11/12 1936) BP: (105-167)/(56-110) 114/56 (11/12 1936) SpO2:  [90 %-98 %] 95 % (11/12 1936) Weight:  [127.9 kg] 127.9 kg (11/12 0909) General:  Alert and oriented, No acute distress HEENT: Normocephalic, atraumatic Neck: No JVD or lymphadenopathy Cardiovascular: Regular rate and rhythm Lungs: Regular rate and effort Abdomen: Soft, nontender, nondistended, no abdominal masses, morbidly obese Extremities: No edema Neurologic: Grossly intact  Laboratory Data:  Results for orders placed or performed during the hospital encounter of 05/27/23 (from the past 24 hour(s))  Lipase, blood     Status: None   Collection Time: 05/27/23  9:14 AM  Result Value Ref Range   Lipase 15 11 - 51 U/L  Comprehensive metabolic panel     Status: Abnormal   Collection Time: 05/27/23  9:14 AM  Result Value Ref Range   Sodium 139 135 - 145 mmol/L   Potassium 3.8 3.5 - 5.1 mmol/L   Chloride 106 98 - 111 mmol/L   CO2 24 22 - 32 mmol/L   Glucose, Bld 113 (H) 70 - 99 mg/dL   BUN 20 6 - 20 mg/dL   Creatinine, Ser 1.61 (H) 0.44 - 1.00 mg/dL   Calcium 09.6 (H) 8.9 - 10.3 mg/dL   Total Protein 8.3 (H) 6.5 - 8.1 g/dL   Albumin 3.7 3.5 - 5.0 g/dL   AST 9 (L) 15 - 41 U/L   ALT 6 0 - 44 U/L   Alkaline Phosphatase 71 38 - 126  U/L   Total Bilirubin 0.3 <1.2 mg/dL   GFR, Estimated 60 (L) >60 mL/min   Anion gap 9 5 - 15  CBC     Status: Abnormal   Collection Time: 05/27/23  9:14 AM  Result Value Ref Range   WBC 8.4 4.0 - 10.5 K/uL   RBC 4.60 3.87 - 5.11 MIL/uL   Hemoglobin 11.5 (L) 12.0 - 15.0 g/dL   HCT 11.9 14.7 - 82.9 %   MCV 81.7 80.0 - 100.0 fL   MCH 25.0 (L) 26.0 - 34.0 pg   MCHC 30.6 30.0 - 36.0 g/dL   RDW 56.2 (H) 13.0 - 86.5 %   Platelets 279 150 - 400 K/uL   nRBC 0.0 0.0 - 0.2 %  Urinalysis, Routine w reflex microscopic -Urine, Clean Catch     Status: Abnormal   Collection Time: 05/27/23  9:14 AM  Result Value Ref Range   Color, Urine YELLOW YELLOW   APPearance HAZY (A) CLEAR   Specific  Gravity, Urine 1.024 1.005 - 1.030   pH 6.0 5.0 - 8.0   Glucose, UA NEGATIVE NEGATIVE mg/dL   Hgb urine dipstick LARGE (A) NEGATIVE   Bilirubin Urine NEGATIVE NEGATIVE   Ketones, ur NEGATIVE NEGATIVE mg/dL   Protein, ur 784 (A) NEGATIVE mg/dL   Nitrite NEGATIVE NEGATIVE   Leukocytes,Ua LARGE (A) NEGATIVE   RBC / HPF >50 0 - 5 RBC/hpf   WBC, UA >50 0 - 5 WBC/hpf   Bacteria, UA NONE SEEN NONE SEEN   Squamous Epithelial / HPF 0-5 0 - 5 /HPF   Mucus PRESENT    Hyaline Casts, UA PRESENT    No results found for this or any previous visit (from the past 240 hour(s)). Creatinine: Recent Labs    05/27/23 0914  CREATININE 1.09*   CT scan personally reviewed and is detailed in history of present illness  Impression/Assessment:  Bilateral ureteral obstruction secondary to ureteral strictures Bilateral hydronephrosis  Plan:  Plan for likely stent upsizing in the operating room.  She has been made n.p.o.  Risk benefits discussed.  If stents fail, she will need nephrostomy tubes.  Ray Church, III 05/28/2023, 12:07 AM

## 2023-05-28 NOTE — Anesthesia Procedure Notes (Signed)
Procedure Name: LMA Insertion Date/Time: 05/28/2023 1:11 PM  Performed by: Lily Lovings, CRNAPre-anesthesia Checklist: Patient identified, Patient being monitored, Timeout performed, Emergency Drugs available and Suction available Patient Re-evaluated:Patient Re-evaluated prior to induction Oxygen Delivery Method: Circle system utilized Preoxygenation: Pre-oxygenation with 100% oxygen Induction Type: IV induction Ventilation: Mask ventilation without difficulty LMA: LMA inserted LMA Size: 4.0 Tube type: Oral Number of attempts: 1 Placement Confirmation: positive ETCO2 and breath sounds checked- equal and bilateral Tube secured with: Tape Dental Injury: Teeth and Oropharynx as per pre-operative assessment

## 2023-05-28 NOTE — Care Management Obs Status (Signed)
MEDICARE OBSERVATION STATUS NOTIFICATION   Patient Details  Name: Lindsay Little MRN: [id number] Date of Birth: [birth year]   Medicare Observation Status Notification Given:  Yes    Otelia Santee, LCSW 05/28/2023, 3:30 PM

## 2023-05-28 NOTE — Transfer of Care (Signed)
Immediate Anesthesia Transfer of Care Note  Patient: Lindsay Little  Procedure(s) Performed: CYSTOSCOPY, BILATERAL RETROGRADE PYELOGRAM, BILATERAL STENT EXCHANGE (Bilateral)  Patient Location: PACU  Anesthesia Type:General  Level of Consciousness: awake and patient cooperative  Airway & Oxygen Therapy: Patient Spontanous Breathing and Patient connected to nasal cannula oxygen  Post-op Assessment: Report given to RN and Patient moving all extremities  Post vital signs: Reviewed and stable  Last Vitals:  Vitals Value Taken Time  BP 160/92 05/28/23 1402  Temp    Pulse 71 05/28/23 1402  Resp 13 05/28/23 1402  SpO2 97 % 05/28/23 1402  Vitals shown include unfiled device data.  Last Pain:  Vitals:   05/28/23 1402  TempSrc:   PainSc: 0-No pain      Patients Stated Pain Goal: 2 (05/27/23 2008)  Complications: No notable events documented.

## 2023-05-28 NOTE — TOC Progression Note (Signed)
Transition of Care [facility]) - Progression Note    Patient Details  Name: Lindsay Little MRN: [id number] Date of Birth: [birth year]  [facility] CM/SW Contact  Otelia Santee, LCSW Phone Number: 05/28/2023, 12:07 PM  Clinical Narrative:    Attempted to meet with pt to complete MOON. Pt at procedure. TOC will attempt to complete MOON once pt has returned to unit.         Expected Discharge Plan and Services                                               Social Determinants of Health (SDOH) Interventions SDOH Screenings   Food Insecurity: No Food Insecurity (05/27/2023)  Housing: Patient Declined (05/27/2023)  Recent Concern: Housing - High Risk (04/07/2023)  Transportation Needs: No Transportation Needs (05/27/2023)  Utilities: Not At Risk (05/27/2023)  Financial Resource Strain: High Risk (10/04/2022)   Received from [facility]  Physical Activity: Inactive (10/04/2022)   Received from [facility], [facility]  Social Connections: Moderately Integrated (10/04/2022)   Received from [facility], [facility]  Recent Concern: Social Connections - Somewhat Isolated (08/27/2022)   Received from [facility]  Stress: Stress Concern Present (10/04/2022)   Received from [facility], [facility]  Tobacco Use: Medium Risk (05/28/2023)    Readmission Risk Interventions    04/13/2023   10:44 AM 07/16/2022    3:05 PM  Readmission Risk Prevention Plan  Transportation Screening Complete Complete  PCP or Specialist Appt within 5-7 Days  Complete  Home Care Screening  Complete  Medication Review (RN CM)  Complete  Medication Review (RN Care Manager) Complete   PCP or Specialist appointment within 3-5 days of discharge Complete   HRI or Home Care Consult Complete   SW Recovery Care/Counseling Consult Complete   Palliative Care Screening Not Applicable   Skilled Nursing Facility Not Applicable

## 2023-05-28 NOTE — Plan of Care (Signed)

## 2023-05-29 ENCOUNTER — Encounter (HOSPITAL_COMMUNITY): Payer: Self-pay | Admitting: Urology

## 2023-05-29 DIAGNOSIS — R102 Pelvic and perineal pain: Secondary | ICD-10-CM | POA: Diagnosis not present

## 2023-05-29 DIAGNOSIS — I10 Essential (primary) hypertension: Secondary | ICD-10-CM

## 2023-05-29 DIAGNOSIS — E782 Mixed hyperlipidemia: Secondary | ICD-10-CM

## 2023-05-29 DIAGNOSIS — G894 Chronic pain syndrome: Secondary | ICD-10-CM | POA: Diagnosis not present

## 2023-05-29 DIAGNOSIS — N1330 Unspecified hydronephrosis: Secondary | ICD-10-CM

## 2023-05-29 DIAGNOSIS — E039 Hypothyroidism, unspecified: Secondary | ICD-10-CM | POA: Diagnosis not present

## 2023-05-29 DIAGNOSIS — R109 Unspecified abdominal pain: Secondary | ICD-10-CM | POA: Diagnosis not present

## 2023-05-29 DIAGNOSIS — N132 Hydronephrosis with renal and ureteral calculous obstruction: Secondary | ICD-10-CM | POA: Diagnosis not present

## 2023-05-29 LAB — CBC WITH DIFFERENTIAL/PLATELET
Abs Immature Granulocytes: 0.05 10*3/uL (ref 0.00–0.07)
Basophils Absolute: 0 10*3/uL (ref 0.0–0.1)
Basophils Relative: 0 %
Eosinophils Absolute: 0 10*3/uL (ref 0.0–0.5)
Eosinophils Relative: 0 %
HCT: 35.4 % — ABNORMAL LOW (ref 36.0–46.0)
Hemoglobin: 10.8 g/dL — ABNORMAL LOW (ref 12.0–15.0)
Immature Granulocytes: 1 %
Lymphocytes Relative: 13 %
Lymphs Abs: 1.1 10*3/uL (ref 0.7–4.0)
MCH: 25.6 pg — ABNORMAL LOW (ref 26.0–34.0)
MCHC: 30.5 g/dL (ref 30.0–36.0)
MCV: 83.9 fL (ref 80.0–100.0)
Monocytes Absolute: 0.5 10*3/uL (ref 0.1–1.0)
Monocytes Relative: 5 %
Neutro Abs: 7.2 10*3/uL (ref 1.7–7.7)
Neutrophils Relative %: 81 %
Platelets: 294 10*3/uL (ref 150–400)
RBC: 4.22 MIL/uL (ref 3.87–5.11)
RDW: 15.5 % (ref 11.5–15.5)
WBC: 8.9 10*3/uL (ref 4.0–10.5)
nRBC: 0.2 % (ref 0.0–0.2)

## 2023-05-29 LAB — COMPREHENSIVE METABOLIC PANEL
ALT: 12 U/L (ref 0–44)
AST: 13 U/L — ABNORMAL LOW (ref 15–41)
Albumin: 3.4 g/dL — ABNORMAL LOW (ref 3.5–5.0)
Alkaline Phosphatase: 64 U/L (ref 38–126)
Anion gap: 7 (ref 5–15)
BUN: 24 mg/dL — ABNORMAL HIGH (ref 6–20)
CO2: 23 mmol/L (ref 22–32)
Calcium: 9.5 mg/dL (ref 8.9–10.3)
Chloride: 103 mmol/L (ref 98–111)
Creatinine, Ser: 0.95 mg/dL (ref 0.44–1.00)
GFR, Estimated: >60 mL/min (ref >60)
Glucose, Bld: 141 mg/dL — ABNORMAL HIGH (ref 70–99)
Potassium: 4.5 mmol/L (ref 3.5–5.1)
Sodium: 133 mmol/L — ABNORMAL LOW (ref 135–145)
Total Bilirubin: 0.4 mg/dL (ref <1.2)
Total Protein: 7.9 g/dL (ref 6.5–8.1)

## 2023-05-29 LAB — MAGNESIUM: Magnesium: 2.1 mg/dL (ref 1.7–2.4)

## 2023-05-29 MED ORDER — HYDROMORPHONE HCL 1 MG/ML IJ SOLN
1.0000 mg | INTRAMUSCULAR | Status: DC | PRN
  Administered 2023-05-29: 1 mg via INTRAVENOUS
  Filled 2023-05-29: qty 1

## 2023-05-29 NOTE — Anesthesia Postprocedure Evaluation (Signed)
Anesthesia Post Note  Patient: Lindsay Little  Procedure(s) Performed: CYSTOSCOPY, BILATERAL RETROGRADE PYELOGRAM, BILATERAL STENT EXCHANGE (Bilateral)     Patient location during evaluation: PACU Anesthesia Type: General Level of consciousness: awake and alert Pain management: pain level controlled Vital Signs Assessment: post-procedure vital signs reviewed and stable Respiratory status: spontaneous breathing, nonlabored ventilation, respiratory function stable and patient connected to nasal cannula oxygen Cardiovascular status: blood pressure returned to baseline and stable Postop Assessment: no apparent nausea or vomiting Anesthetic complications: no   No notable events documented.  Last Vitals:  Vitals:   05/28/23 1941 05/29/23 0557  BP: 127/77 113/71  Pulse: 63 61  Resp: 20 20  Temp: 36.7 C 36.7 C  SpO2: 99% 96%    Last Pain:  Vitals:   05/29/23 0932  TempSrc:   PainSc: 8                  [place]

## 2023-05-29 NOTE — Progress Notes (Signed)
1 Day Post-Op Subjective: Pt still struggling with significant chronic pelvic pain. Discussed with RN who has notified primary team.   Objective: Vital signs in last 24 hours: Temp:  [97.8 F (36.6 C)-98.1 F (36.7 C)] 98.1 F (36.7 C) (11/14 0557) Pulse Rate:  [60-89] 61 (11/14 0557) Resp:  [11-20] 20 (11/14 0557) BP: (109-160)/(67-92) 113/71 (11/14 0557) SpO2:  [96 %-100 %] 96 % (11/14 0557) Weight:  [127.9 kg] 127.9 kg (11/13 1123)  Assessment/Plan: # Flank pain # Worsening bilateral hydronephrosis  To the OR with Dr. Alvester Morin for 05/28/2023.  Preserved renal function and no signs of infection this morning.  Okay to discharge from urologic perspective. She will follow-up with her regular urologist, Dr. Logan Bores with the [facility] for future stent exchanges-3 months. Patient is having a severe flare this morning of her chronic pain.  This is lower in her pelvis and less likely to be representative of stent colic.  Nursing has reached out to primary team to address her pain management.  Intake/Output from previous day: 11/13 0701 - 11/14 0700 In: 770 [I.V.:720; IV Piggyback:50] Out: 0   Intake/Output this shift: No intake/output data recorded.  Physical Exam:  General: Alert and oriented, discomfort from chronic pelvic pain CV: No cyanosis Lungs: equal chest rise  Lab Results: Recent Labs    05/27/23 0914 05/28/23 0513 05/29/23 0530  HGB 11.5* 10.9* 10.8*  HCT 37.6 36.7 35.4*   BMET Recent Labs    05/28/23 0513 05/29/23 0530  NA 135 133*  K 4.3 4.5  CL 104 103  CO2 24 23  GLUCOSE 105* 141*  BUN 20 24*  CREATININE 0.84 0.95  CALCIUM 9.2 9.5     Studies/Results: DG C-Arm 1-60 Min-No Report  Result Date: 05/28/2023 Fluoroscopy was utilized by the requesting physician.  No radiographic interpretation.   CT ABDOMEN PELVIS W CONTRAST  Result Date: 05/27/2023 CLINICAL DATA:  Right abdominal pain, flank pain EXAM: CT ABDOMEN AND PELVIS WITH CONTRAST  TECHNIQUE: Multidetector CT imaging of the abdomen and pelvis was performed using the standard protocol following bolus administration of intravenous contrast. RADIATION DOSE REDUCTION: This exam was performed according to the departmental dose-optimization program which includes automated exposure control, adjustment of the mA and/or kV according to patient size and/or use of iterative reconstruction technique. CONTRAST:  OMNIPAQUE IOHEXOL 300 MG/ML  SOLN COMPARISON:  04/07/2023 FINDINGS: Lower chest: No acute abnormality Hepatobiliary: Insert paddle biliary Pancreas: No focal abnormality or ductal dilatation. Spleen: No focal abnormality.  Normal size. Adrenals/Urinary Tract: Adrenal glands normal. Bilateral ureteral stents are in place. Worsening bilateral hydronephrosis, severe on the right and moderate on the left. Urinary bladder decompressed, grossly unremarkable. Stomach/Bowel: Prior partial colectomy with left abdominal colostomy. Stomach and small bowel decompressed. Vascular/Lymphatic: Aortic atherosclerosis. No evidence of aneurysm or adenopathy. Reproductive: Prior hysterectomy.  No adnexal masses. Other: Fluid collection and soft tissue thickening noted in the presacral space. The fluid collection currently measures 5.4 x 2.5 cm, compared to 4.6 x 1.7 cm previously. Musculoskeletal: No acute bony abnormality. IMPRESSION: Bilateral ureteral stents are in place. Worsening bilateral hydronephrosis, severe on the right and moderate on the left. Postoperative changes from partial left hemicolectomy with left abdominal colostomy. No bowel obstruction. Increasing postoperative fluid collection in the lower pelvis/presacral space, now 5.4 x 2.5 cm compared to 4.6 x 1.7 cm previously. Electronically Signed   By: Charlett Nose M.D.   On: 05/27/2023 13:38      LOS: 0 days   Sheria Lang  Coralee Pesa, NP [facility] Pager: [phone]  05/29/2023, 8:49 AM

## 2023-05-29 NOTE — Discharge Instructions (Addendum)
Please take all prescribed medications exactly as instructed.  Please consume a low sodium low fat diet Please increase your physical activity as tolerated. Please maintain all outpatient follow-up appointments including follow-up with your primary care provider, Dr. Logan Bores your urologist and your pain medicine provider. Please return to the emergency department if you develop worsening pain, fevers in excess of 100.4 F, weakness or inability to tolerate oral intake.

## 2023-06-05 NOTE — Assessment & Plan Note (Signed)
.   Resume patients home regimen of oral antihypertensives . Titrate antihypertensive regimen as necessary to achieve adequate BP control . PRN intravenous antihypertensives for excessively elevated blood pressure   

## 2023-06-05 NOTE — Assessment & Plan Note (Signed)
See assessment and plan above

## 2023-06-05 NOTE — Assessment & Plan Note (Signed)
   Continue home regimen of Keppra 

## 2023-06-05 NOTE — Assessment & Plan Note (Signed)
On going substantial pain superimposed on chronic pain syndrome. Continue as needed analgesics as noted above

## 2023-06-05 NOTE — Assessment & Plan Note (Signed)
.   Continuing home regimen of lipid lowering therapy.  

## 2023-06-05 NOTE — Assessment & Plan Note (Signed)
Flank pain improved while patient continues to have substantial lower abdominal pain. Patient is status post cystoscopy with bilateral retrograde pyelogram and bilateral ureteral stent exchange and upsize. Advancing diet, providing patient with as needed opiate-based analgesics for continued pain Monitoring renal function with serial chemistries Urology following, their input is appreciated.

## 2023-06-05 NOTE — Progress Notes (Addendum)
  PROGRESS NOTE   Lindsay Little  UXL:[id number] DOB: 11/09/1965 DOA: 05/27/2023 PCP: Laurann Montana, MD   Date of Service: the patient was seen and examined on 05/28/2023  Brief Narrative:  Lindsay Little is a 57 y.o. female with medical history significant for colorectal cancer s/p surgical resection with diverting colostomy, rectivaginal fistula s/p repair, chronic obstructive uropathy with bilateral hydronephrosis s/p multiple urological intervention most recently bilateral ureteral stent exchange 04/18/2023, HTN, HLD, hypothyroidism, seizure disorder, chronic pain syndrome who is admitted for flank pain in setting of worsening bilateral hydronephrosis.   Assessment & Plan Acute right flank pain Flank pain improved while patient continues to have substantial lower abdominal pain. Patient is status post cystoscopy with bilateral retrograde pyelogram and bilateral ureteral stent exchange and upsize. Advancing diet, providing patient with as needed opiate-based analgesics for continued pain Monitoring renal function with serial chemistries Urology following, their input is appreciated. Bilateral hydronephrosis See assessment and plan above. Essential hypertension Resume patients home regimen of oral antihypertensives Titrate antihypertensive regimen as necessary to achieve adequate BP control PRN intravenous antihypertensives for excessively elevated blood pressure  Chronic pain syndrome On going substantial pain superimposed on chronic pain syndrome. Continue as needed analgesics as noted above Acquired hypothyroidism Resume home regimen of Synthroid  Hyperlipidemia Continuing home regimen of lipid lowering therapy.  Seizure disorder (HCC) Continue home regimen of Keppra.    Subjective:  Patient continues to complain of severe pain, mostly located in the lower abdomen, sharp in quality, worse with movement improved with rest.  Patient denies any associated nausea or  vomiting.  Patient states that her pain is somewhat improved compared to prior to the procedure.  Physical Exam:  Vitals:   05/28/23 1500 05/28/23 1526 05/28/23 1941 05/29/23 0557  BP: 109/67 122/73 127/77 113/71  Pulse: 71 60 63 61  Resp: 11  20 20   Temp:  98 F (36.7 C) 98 F (36.7 C) 98.1 F (36.7 C)  TempSrc:  Oral Oral Oral  SpO2: 96% 100% 99% 96%  Weight:      Height:        Constitutional: Awake alert and oriented x3, in distress due to abdominal pain Skin: no rashes, no lesions, good skin turgor noted. Eyes: Pupils are equally reactive to light.  No evidence of scleral icterus or conjunctival pallor.  ENMT: Moist mucous membranes noted.  Posterior pharynx clear of any exudate or lesions.   Respiratory: clear to auscultation bilaterally, no wheezing, no crackles. Normal respiratory effort. No accessory muscle use.  Cardiovascular: Regular rate and rhythm, no murmurs / rubs / gallops. No extremity edema. 2+ pedal pulses. No carotid bruits.  Abdomen: Abdomen is soft and notably tender in the suprapubic area and bilateral lower quadrants.  No evidence of intra-abdominal masses.  Positive bowel sounds noted in all quadrants.   Musculoskeletal: No joint deformity upper and lower extremities. Good ROM, no contractures. Normal muscle tone.    Data Reviewed:  I have personally reviewed and interpreted labs, imaging.  Significant findings are   Cr: 0.84 Hgb 10.8  Code Status:  Full code.  Code status decision has been confirmed with: patient    Severity of Illness:  The appropriate patient status for this patient is Observation. Time spent:  45 minutes  Author:  Marinda Elk MD  05/28/2023 2:25 PM

## 2023-06-05 NOTE — Assessment & Plan Note (Signed)
.   Resume home regimen of Synthroid 

## 2023-06-05 NOTE — Discharge Summary (Signed)
Physician Discharge Summary   Patient: Lindsay Little MRN: [id number] DOB: 15-Oct-1965  Admit date:     05/27/2023  Discharge date: 05/29/2023  Discharge Physician: Marinda Elk   PCP: Laurann Montana, MD   Recommendations at discharge:   Please take all prescribed medications exactly as instructed.  Please consume a low sodium low fat diet Please increase your physical activity as tolerated. Please maintain all outpatient follow-up appointments including follow-up with your primary care provider, Dr. Logan Bores your urologist and your pain medicine provider. Please return to the emergency department if you develop worsening pain, fevers in excess of 100.4 F, weakness or inability to tolerate oral intake.  Discharge Diagnoses: Principal Problem:   Acute right flank pain Active Problems:   Bilateral hydronephrosis   Rectal adenocarcinoma s/p LAR resection 05/25/2015   Essential hypertension   Colostomy in place [facility])   Chronic pain syndrome   Acquired hypothyroidism   Hyperlipidemia   Seizure disorder (HCC)  Resolved Problems:   * No resolved hospital problems. *   Hospital Course: Lindsay Little is a 57 y.o. female with medical history significant for colorectal cancer s/p surgical resection with diverting colostomy, rectivaginal fistula s/p repair, chronic obstructive uropathy with bilateral hydronephrosis s/p multiple urological intervention most recently bilateral ureteral stent exchange 04/18/2023, HTN, HLD, hypothyroidism, seizure disorder, chronic pain syndrome who presented to [facility] for flank pain in the setting of worsening bilateral hydronephrosis.  CT imaging on arrival revealed while the bilateral ureteral stents were in place patient was exhibiting worsening bilateral hydronephrosis which was thought to be the cause of her pain.  The hospitalist group was then called to assess the patient for admission to the hospital.  Dr. Alvester Morin with urology was  consulted who took the patient to the operating room on 05/28/2023 for cystoscopy with bilateral retrograde pyelogram and bilateral ureteral stent exchange and upsize.  The procedure was tolerated well with the patient's postoperative course.  Patient was monitored while hospitalized for an additional 24 hours postoperatively due to continued need for titration of opiate-based analgesics.    Gradually, the patient's pain improved.  Patient's diet was tolerated successfully with patient able to void adequately.  Patient was discharged in improved and stable condition on 05/29/2023.     Pain control - [employer] Controlled Substance Reporting System database was reviewed. and patient was instructed, not to drive, operate heavy machinery, perform activities at heights, swimming or participation in water activities or provide baby-sitting services while on Pain, Sleep and Anxiety Medications; until their outpatient Physician has advised to do so again. Also recommended to not to take more than prescribed Pain, Sleep and Anxiety Medications.   Consultants: Dr. Alvester Morin with Urology Procedures performed: 05/28/2023 -cystoscopy with bilateral retrograde pyelogram and bilateral ureteral stent exchange and upsize Disposition: Home Diet recommendation:  Discharge Diet Orders (From admission, onward)     Start     Ordered   05/29/23 0000  Diet - low sodium heart healthy        05/29/23 1146           Cardiac diet  DISCHARGE MEDICATION: Allergies as of 05/29/2023       Reactions   Lidocaine Swelling, Rash, Other (See Comments)   Eyes swell shut; includes all -caine drugs except marcaine. EMLA cream OK though   Bupropion Other (See Comments)   "Not effective"   Metronidazole Nausea And Vomiting, Other (See Comments)   GI Intolerance and sleeplessness   Oxycodone Other (See  Comments)   NIGHTMARES. (tolerates hydrocodone or tramadol better)   Penicillins Nausea Only, Rash   Sulfa  Antibiotics Nausea And Vomiting, Rash   Pantoprazole Other (See Comments)   "not effective"   Adhesive [tape] Rash, Other (See Comments)   Blisters - can use paper tape   Doxycycline Nausea Only, Other (See Comments)   GI Intolerance   Iron Nausea Only, Other (See Comments)   GI Intolerance   Metoprolol Nausea Only, Palpitations   Wound Dressing Adhesive Rash, Other (See Comments)   Blisters - can use paper tape        Medication List     STOP taking these medications    promethazine 25 MG tablet Commonly known as: PHENERGAN       TAKE these medications    bisoprolol 5 MG tablet Commonly known as: ZEBETA Take 5 mg by mouth daily.   CoQ10 200 MG Caps Take 200 mg by mouth at bedtime.   DULoxetine 60 MG capsule Commonly known as: CYMBALTA Take 120 mg by mouth at bedtime.   esomeprazole 40 MG capsule Commonly known as: NEXIUM Take 1 capsule (40 mg total) by mouth 2 (two) times daily before a meal. What changed:  when to take this additional instructions   famotidine 20 MG tablet Commonly known as: PEPCID Take 20 mg by mouth 2 (two) times daily as needed for heartburn or indigestion.   furosemide 20 MG tablet Commonly known as: LASIX Take 20 mg by mouth daily as needed for edema.   HYDROmorphone 4 MG tablet Commonly known as: DILAUDID Take one tablet (4 mg dose) by mouth every 6 (six) hours as needed for Pain for up to 30 days. Max Daily Amount: 16 mg What changed: when to take this   hyoscyamine 0.125 MG tablet Commonly known as: LEVSIN Take 0.125 mg by mouth 3 (three) times daily as needed for cramping.   Ibuprofen 200 MG Caps Take 400 mg by mouth every 6 (six) hours as needed (for pain).   levETIRAcetam 250 MG tablet Commonly known as: KEPPRA Take 500 mg by mouth in the morning and at bedtime.   levothyroxine 50 MCG tablet Commonly known as: SYNTHROID Take 50 mcg by mouth daily before breakfast.   melatonin 3 MG Tabs tablet Take 3-6 mg by  mouth at bedtime as needed (sleep).   naloxone 4 MG/0.1ML Liqd nasal spray kit Commonly known as: NARCAN Place 0.4 mg into the nose once.   oxybutynin 15 MG 24 hr tablet Commonly known as: DITROPAN XL Take 15 mg by mouth at bedtime.   polyethylene glycol 17 g packet Commonly known as: MIRALAX / GLYCOLAX Take 17 g by mouth See admin instructions. Mix 17 grams into the recommended amount of a beverage and drink once a day- may take an additional 17 grams once a day as needed for constipation   pregabalin 225 MG capsule Commonly known as: LYRICA Take 225 mg by mouth 2 (two) times daily.   rosuvastatin 5 MG tablet Commonly known as: CRESTOR Take 5 mg by mouth every evening.   tamsulosin 0.4 MG Caps capsule Commonly known as: FLOMAX Take 0.4 mg by mouth daily.   triamcinolone lotion 0.1 % Commonly known as: KENALOG Apply 1 application  topically 2 (two) times daily as needed (for itching- scalp).   TYLENOL 500 MG tablet Generic drug: acetaminophen Take 1,000 mg by mouth every 6 (six) hours as needed for mild pain (pain score 1-3) or headache.  Follow-up Information     Laurann Montana, MD. Schedule an appointment as soon as possible for a visit in 1 week(s).   Specialty: Family Medicine Contact information: [address], [address] [place] [address] [phone]         Your pain management provider. Schedule an appointment as soon as possible for a visit in 1 week(s).          Jamison Neighbor, MD. Schedule an appointment as soon as possible for a visit.   Specialty: Urology Contact information: [address] [place] [address] [phone]                 Discharge Exam: Ceasar Mons Weights   05/27/23 0909 05/28/23 1123  Weight: 127.9 kg 127.9 kg    Constitutional: Awake alert and oriented x3, no associated distress.   Respiratory: clear to auscultation bilaterally, no wheezing, no crackles. Normal respiratory effort. No  accessory muscle use.  Cardiovascular: Regular rate and rhythm, no murmurs / rubs / gallops. No extremity edema. 2+ pedal pulses. No carotid bruits.  Abdomen: Lower abdomen is soft with some tenderness.  No evidence of intra-abdominal masses.  Positive bowel sounds noted in all quadrants.   Musculoskeletal: No joint deformity upper and lower extremities. Good ROM, no contractures. Normal muscle tone.     Condition at discharge: fair  The results of significant diagnostics from this hospitalization (including imaging, microbiology, ancillary and laboratory) are listed below for reference.   Imaging Studies: DG C-Arm 1-60 Min-No Report  Result Date: 05/28/2023 Fluoroscopy was utilized by the requesting physician.  No radiographic interpretation.   CT ABDOMEN PELVIS W CONTRAST  Result Date: 05/27/2023 CLINICAL DATA:  Right abdominal pain, flank pain EXAM: CT ABDOMEN AND PELVIS WITH CONTRAST TECHNIQUE: Multidetector CT imaging of the abdomen and pelvis was performed using the standard protocol following bolus administration of intravenous contrast. RADIATION DOSE REDUCTION: This exam was performed according to the departmental dose-optimization program which includes automated exposure control, adjustment of the mA and/or kV according to patient size and/or use of iterative reconstruction technique. CONTRAST:  OMNIPAQUE IOHEXOL 300 MG/ML  SOLN COMPARISON:  04/07/2023 FINDINGS: Lower chest: No acute abnormality Hepatobiliary: Insert paddle biliary Pancreas: No focal abnormality or ductal dilatation. Spleen: No focal abnormality.  Normal size. Adrenals/Urinary Tract: Adrenal glands normal. Bilateral ureteral stents are in place. Worsening bilateral hydronephrosis, severe on the right and moderate on the left. Urinary bladder decompressed, grossly unremarkable. Stomach/Bowel: Prior partial colectomy with left abdominal colostomy. Stomach and small bowel decompressed. Vascular/Lymphatic: Aortic  atherosclerosis. No evidence of aneurysm or adenopathy. Reproductive: Prior hysterectomy.  No adnexal masses. Other: Fluid collection and soft tissue thickening noted in the presacral space. The fluid collection currently measures 5.4 x 2.5 cm, compared to 4.6 x 1.7 cm previously. Musculoskeletal: No acute bony abnormality. IMPRESSION: Bilateral ureteral stents are in place. Worsening bilateral hydronephrosis, severe on the right and moderate on the left. Postoperative changes from partial left hemicolectomy with left abdominal colostomy. No bowel obstruction. Increasing postoperative fluid collection in the lower pelvis/presacral space, now 5.4 x 2.5 cm compared to 4.6 x 1.7 cm previously. Electronically Signed   By: Charlett Nose M.D.   On: 05/27/2023 13:38    Microbiology: Results for orders placed or performed during the hospital encounter of 05/27/23  Urine Culture     Status: Abnormal   Collection Time: 05/27/23  9:14 AM   Specimen: Urine, Clean Catch  Result Value Ref Range Status  Specimen Description   Final    URINE, CLEAN CATCH Performed at [facility], [address] [place], [place] [address]    Special Requests   Final    NONE Performed at [facility], [address], [place], [place] [address]    Culture (A)  Final    30,000 COLONIES/mL MULTIPLE SPECIES PRESENT, SUGGEST RECOLLECTION   Report Status 05/28/2023 FINAL  Final    Labs: CBC: No results for input(s): "WBC", "NEUTROABS", "HGB", "HCT", "MCV", "PLT" in the last 168 hours. Basic Metabolic Panel: No results for input(s): "NA", "K", "CL", "CO2", "GLUCOSE", "BUN", "CREATININE", "CALCIUM", "MG", "PHOS" in the last 168 hours. Liver Function Tests: No results for input(s): "AST", "ALT", "ALKPHOS", "BILITOT", "PROT", "ALBUMIN" in the last 168 hours. CBG: No results for input(s): "GLUCAP" in the last 168 hours.  Discharge time spent: greater than 30 minutes.  Signed: Marinda Elk, MD [facility] 06/05/2023

## 2023-06-08 ENCOUNTER — Emergency Department (HOSPITAL_BASED_OUTPATIENT_CLINIC_OR_DEPARTMENT_OTHER): Payer: Medicare HMO

## 2023-06-08 ENCOUNTER — Emergency Department (HOSPITAL_BASED_OUTPATIENT_CLINIC_OR_DEPARTMENT_OTHER)
Admission: EM | Admit: 2023-06-08 | Discharge: 2023-06-09 | Disposition: A | Discharge status: Home / Self Care | Payer: Medicare HMO | Attending: Emergency Medicine | Admitting: Emergency Medicine

## 2023-06-08 ENCOUNTER — Other Ambulatory Visit: Payer: Self-pay

## 2023-06-08 ENCOUNTER — Encounter (HOSPITAL_BASED_OUTPATIENT_CLINIC_OR_DEPARTMENT_OTHER): Payer: Self-pay | Admitting: *Deleted

## 2023-06-08 DIAGNOSIS — Z90710 Acquired absence of both cervix and uterus: Secondary | ICD-10-CM | POA: Diagnosis not present

## 2023-06-08 DIAGNOSIS — I10 Essential (primary) hypertension: Secondary | ICD-10-CM | POA: Diagnosis not present

## 2023-06-08 DIAGNOSIS — I951 Orthostatic hypotension: Secondary | ICD-10-CM | POA: Diagnosis not present

## 2023-06-08 DIAGNOSIS — R1084 Generalized abdominal pain: Secondary | ICD-10-CM | POA: Insufficient documentation

## 2023-06-08 DIAGNOSIS — R109 Unspecified abdominal pain: Secondary | ICD-10-CM

## 2023-06-08 DIAGNOSIS — R58 Hemorrhage, not elsewhere classified: Secondary | ICD-10-CM | POA: Diagnosis not present

## 2023-06-08 DIAGNOSIS — R1110 Vomiting, unspecified: Secondary | ICD-10-CM | POA: Insufficient documentation

## 2023-06-08 DIAGNOSIS — E860 Dehydration: Secondary | ICD-10-CM | POA: Diagnosis not present

## 2023-06-08 DIAGNOSIS — R0902 Hypoxemia: Secondary | ICD-10-CM | POA: Diagnosis not present

## 2023-06-08 LAB — CBC
HCT: 42.3 % (ref 36.0–46.0)
Hemoglobin: 12.9 g/dL (ref 12.0–15.0)
MCH: 24.8 pg — ABNORMAL LOW (ref 26.0–34.0)
MCHC: 30.5 g/dL (ref 30.0–36.0)
MCV: 81.2 fL (ref 80.0–100.0)
Platelets: 356 10*3/uL (ref 150–400)
RBC: 5.21 MIL/uL — ABNORMAL HIGH (ref 3.87–5.11)
RDW: 16.2 % — ABNORMAL HIGH (ref 11.5–15.5)
WBC: 10.6 10*3/uL — ABNORMAL HIGH (ref 4.0–10.5)
nRBC: 0 % (ref 0.0–0.2)

## 2023-06-08 LAB — COMPREHENSIVE METABOLIC PANEL
ALT: 9 U/L (ref 0–44)
AST: 11 U/L — ABNORMAL LOW (ref 15–41)
Albumin: 4.2 g/dL (ref 3.5–5.0)
Alkaline Phosphatase: 76 U/L (ref 38–126)
Anion gap: 9 (ref 5–15)
BUN: 16 mg/dL (ref 6–20)
CO2: 22 mmol/L (ref 22–32)
Calcium: 10 mg/dL (ref 8.9–10.3)
Chloride: 107 mmol/L (ref 98–111)
Creatinine, Ser: 0.79 mg/dL (ref 0.44–1.00)
GFR, Estimated: >60 mL/min (ref >60)
Glucose, Bld: 136 mg/dL — ABNORMAL HIGH (ref 70–99)
Potassium: 4.8 mmol/L (ref 3.5–5.1)
Sodium: 138 mmol/L (ref 135–145)
Total Bilirubin: 0.3 mg/dL (ref <1.2)
Total Protein: 8.6 g/dL — ABNORMAL HIGH (ref 6.5–8.1)

## 2023-06-08 LAB — LIPASE, BLOOD: Lipase: 20 U/L (ref 11–51)

## 2023-06-08 MED ORDER — PROCHLORPERAZINE EDISYLATE 10 MG/2ML IJ SOLN
10.0000 mg | Freq: Once | INTRAMUSCULAR | Status: AC
  Administered 2023-06-08: 10 mg via INTRAVENOUS
  Filled 2023-06-08: qty 2

## 2023-06-08 MED ORDER — DIPHENHYDRAMINE HCL 50 MG/ML IJ SOLN
12.5000 mg | Freq: Once | INTRAMUSCULAR | Status: AC
  Administered 2023-06-08: 12.5 mg via INTRAVENOUS
  Filled 2023-06-08: qty 1

## 2023-06-08 MED ORDER — HYDROMORPHONE HCL 1 MG/ML IJ SOLN
0.5000 mg | Freq: Once | INTRAMUSCULAR | Status: AC
  Administered 2023-06-08: 0.5 mg via INTRAVENOUS
  Filled 2023-06-08: qty 1

## 2023-06-08 MED ORDER — SODIUM CHLORIDE 0.9 % IV BOLUS: 1000.0000 mL | Freq: Once | INTRAVENOUS | Status: DC

## 2023-06-08 MED ORDER — DILTIAZEM HCL 25 MG/5ML IV SOLN
10.0000 mg | Freq: Once | INTRAVENOUS | Status: AC
  Administered 2023-06-08: 10 mg via INTRAVENOUS
  Filled 2023-06-08: qty 5

## 2023-06-08 MED ORDER — PROMETHAZINE HCL 25 MG PO TABS: 25.0000 mg | ORAL_TABLET | Freq: Four times a day (QID) | ORAL | 0 refills | Status: AC | PRN

## 2023-06-08 MED ORDER — ONDANSETRON HCL 4 MG/2ML IJ SOLN
4.0000 mg | Freq: Once | INTRAMUSCULAR | Status: AC
Start: 2023-06-08 — End: 2023-06-08
  Administered 2023-06-08: 4 mg via INTRAVENOUS
  Filled 2023-06-08: qty 2

## 2023-06-08 MED ORDER — FENTANYL CITRATE PF 50 MCG/ML IJ SOSY
50.0000 ug | PREFILLED_SYRINGE | Freq: Once | INTRAMUSCULAR | Status: AC
  Administered 2023-06-08: 50 ug via INTRAVENOUS
  Filled 2023-06-08: qty 1

## 2023-06-08 MED ORDER — IOHEXOL 300 MG/ML  SOLN
100.0000 mL | Freq: Once | INTRAMUSCULAR | Status: AC | PRN
  Administered 2023-06-08: 100 mL via INTRAVENOUS

## 2023-06-08 NOTE — ED Triage Notes (Signed)
Pt arrives from home via [facility] with c/o abdominal pain. Per report, pt has been having abdominal pain today. She also had recent renal stents placed, has had 2 months of vaginal bleeding-seen at [facility] for the same, and has/ I, hr increased output in colostomy. En route vitals 158/106, hr 110, cbg 109, 98% ra

## 2023-06-08 NOTE — ED Triage Notes (Signed)
Pt says she woke up around noon today feeling bad, having nausea and sweating a lot. She says she went back to sleep and woke up around 5:30 with increased abdominal pain. She says that she has not had output from her colostomy in about 4 days, now it "started pouring water", she is worried she has a blockage.

## 2023-06-08 NOTE — Discharge Instructions (Addendum)
Keep yourself hydrated.  Take the nausea medication as prescribed.  Return to the ED with new or worsening symptoms.

## 2023-06-08 NOTE — ED Notes (Signed)
Dr. Manus Gunning notified of HR and Bp.

## 2023-06-08 NOTE — ED Notes (Signed)
Pt. Up to side of bed with HR 137 and SBP 98.

## 2023-06-08 NOTE — ED Notes (Signed)
Pt is aware she needs to provide a urine sample; she is unable to at this time.

## 2023-06-08 NOTE — ED Provider Notes (Addendum)
[place] [facility]ERGENCY DEPARTMENT AT [facility] Provider Note   CSN: [id number] Arrival date & time: 06/08/23  1950     History  Chief Complaint  Patient presents with   Abdominal Pain    Lindsay Little is a 57 y.o. female.  Patient here with abdominal pain and concern for bowel obstruction.  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  Recently had stent changed.  She has had diminished output from her ostomy and only some liquid discharge to her ostomy which is consistent when she has had obstructions in the past.  She has a history of high cholesterol IBS ostomy in the setting of colon/rectal cancer.  She had 1 episode of emesis today.  She denies any fever.  No weakness numbness tingling otherwise.  Her main concern is that she might have another bowel obstruction or ileus or issue with her stents.  The history is provided by the patient.       Home Medications Prior to Admission medications   Medication Sig Start Date End Date Taking? Authorizing Provider  promethazine (PHENERGAN) 25 MG tablet Take 1 tablet (25 mg total) by mouth every 6 (six) hours as needed for nausea or vomiting. 06/08/23  Yes Estel Scholze, DO  bisoprolol (ZEBETA) 5 MG tablet Take 5 mg by mouth daily.    [provider]  Coenzyme Q10 (COQ10) 200 MG CAPS Take 200 mg by mouth at bedtime.    [provider]  DULoxetine (CYMBALTA) 60 MG capsule Take 120 mg by mouth at bedtime.    [provider]  esomeprazole (NEXIUM) 40 MG capsule Take 1 capsule (40 mg total) by mouth 2 (two) times daily before a meal. Patient taking differently: Take 40 mg by mouth See admin instructions. Take 40 mg by mouth in the morning before breakfast and an additional 40 mg once a day as needed for unresolved reflux 12/22/15   [name], MD  famotidine (PEPCID) 20 MG tablet Take 20 mg by mouth 2 (two) times daily as needed for heartburn or indigestion.    [provider]   furosemide (LASIX) 20 MG tablet Take 20 mg by mouth daily as needed for edema.     [provider]  HYDROmorphone (DILAUDID) 4 MG tablet Take one tablet (4 mg dose) by mouth every 6 (six) hours as needed for Pain for up to 30 days. Max Daily Amount: 16 mg Patient taking differently: Take 4 mg by mouth every 6 (six) hours. 05/13/23     hyoscyamine (LEVSIN) 0.125 MG tablet Take 0.125 mg by mouth 3 (three) times daily as needed for cramping. 01/28/23   [provider]  Ibuprofen 200 MG CAPS Take 400 mg by mouth every 6 (six) hours as needed (for pain).    [provider]  levETIRAcetam (KEPPRA) 250 MG tablet Take 500 mg by mouth in the morning and at bedtime.    [provider]  levothyroxine (SYNTHROID) 50 MCG tablet Take 50 mcg by mouth daily before breakfast. 02/07/22   [provider]  Melatonin 3 MG TABS Take 3-6 mg by mouth at bedtime as needed (sleep).    [provider]  naloxone [facility]) nasal spray 4 mg/0.1 mL Place 0.4 mg into the nose once. 12/18/22   [provider]  oxybutynin (DITROPAN XL) 15 MG 24 hr tablet Take 15 mg by mouth at bedtime.    [provider]  polyethylene glycol (MIRALAX / GLYCOLAX) 17 g packet Take 17  g by mouth See admin instructions. Mix 17 grams into the recommended amount of a beverage and drink once a day- may take an additional 17 grams once a day as needed for constipation    [provider]  pregabalin (LYRICA) 225 MG capsule Take 225 mg by mouth 2 (two) times daily. 02/07/22   [provider]  rosuvastatin (CRESTOR) 5 MG tablet Take 5 mg by mouth every evening.    [provider]  tamsulosin (FLOMAX) 0.4 MG CAPS capsule Take 0.4 mg by mouth daily.    [provider]  triamcinolone lotion (KENALOG) 0.1 % Apply 1 application  topically 2 (two) times daily as needed (for itching- scalp).    [provider]  TYLENOL 500 MG tablet Take 1,000 mg by mouth  every 6 (six) hours as needed for mild pain (pain score 1-3) or headache.    [provider]      Allergies    Lidocaine, Bupropion, Metronidazole, Oxycodone, Penicillins, Sulfa antibiotics, Pantoprazole, Adhesive [tape], Doxycycline, Iron, Metoprolol, and Wound dressing adhesive    Review of Systems   Review of Systems  Physical Exam Updated Vital Signs BP 121/67   Pulse (!) 113   Temp 98.2 F (36.8 C) (Oral)   Resp 18   SpO2 92%  Physical Exam Vitals and nursing note reviewed.  Constitutional:      General: She is not in acute distress.    Appearance: She is well-developed. She is not ill-appearing.  HENT:     Head: Normocephalic and atraumatic.  Eyes:     Conjunctiva/sclera: Conjunctivae normal.  Cardiovascular:     Rate and Rhythm: Normal rate and regular rhythm.     Heart sounds: Normal heart sounds. No murmur heard. Pulmonary:     Effort: Pulmonary effort is normal. No respiratory distress.     Breath sounds: Normal breath sounds.  Abdominal:     Palpations: Abdomen is soft.     Tenderness: There is generalized abdominal tenderness.  Musculoskeletal:        General: No swelling.     Cervical back: Neck supple.  Skin:    General: Skin is warm and dry.     Capillary Refill: Capillary refill takes less than 2 seconds.  Neurological:     General: No focal deficit present.     Mental Status: She is alert.  Psychiatric:        Mood and Affect: Mood normal.     ED Results / Procedures / Treatments   Labs (all labs ordered are listed, but only abnormal results are displayed) Labs Reviewed  COMPREHENSIVE METABOLIC PANEL - Abnormal; Notable for the following components:      Result Value   Glucose, Bld 136 (*)    Total Protein 8.6 (*)    AST 11 (*)    All other components within normal limits  CBC - Abnormal; Notable for the following components:   WBC 10.6 (*)    RBC 5.21 (*)    MCH 24.8 (*)    RDW 16.2 (*)    All other components within normal  limits  LIPASE, BLOOD    EKG EKG Interpretation Date/Time:  Sunday June 08 2023 19:56:42 EST Ventricular Rate:  111 PR Interval:  181 QRS Duration:  107 QT Interval:  349 QTC Calculation: 475 R Axis:   -23  Text Interpretation: Sinus tachycardia Left atrial enlargement Borderline left axis deviation Anterior infarct, old Confirmed by Virgina Norfolk [phone]) on 06/08/2023 9:55:27 PM  Radiology CT ABDOMEN PELVIS W CONTRAST  Result Date: 06/08/2023 CLINICAL DATA:  Generalized abdominal pain, renal stents placed recently. Vaginal bleeding for 2 months. Increased colostomy output. EXAM: CT ABDOMEN AND PELVIS WITH CONTRAST TECHNIQUE: Multidetector CT imaging of the abdomen and pelvis was performed using the standard protocol following bolus administration of intravenous contrast. RADIATION DOSE REDUCTION: This exam was performed according to the departmental dose-optimization program which includes automated exposure control, adjustment of the mA and/or kV according to patient size and/or use of iterative reconstruction technique. CONTRAST:  OMNIPAQUE IOHEXOL 300 MG/ML  SOLN COMPARISON:  05/27/2023 FINDINGS: Lower chest: No acute abnormality. Hepatobiliary: No focal liver abnormality is seen. No gallstones, gallbladder wall thickening, or biliary dilatation. Pancreas: Unremarkable. Spleen: Unremarkable. Adrenals/Urinary Tract: Normal adrenal glands. Bilateral ureteral stents with decreased hydroureteronephrosis compared to 05/27/2023. Small locule of gas in the bladder. Stomach/Bowel: Postoperative change of partial colectomy with left upper quadrant colostomy. The stomach is within normal limits. No evidence of bowel obstruction. No bowel wall thickening. Vascular/Lymphatic: Aortic atherosclerosis. No enlarged abdominal or pelvic lymph nodes. Reproductive: Status post hysterectomy. No adnexal masses. Other: Similar fluid and gas collection in the presacral space with thick mildly enhancing  wall. This again measures 5.4 x 2.5 cm. Musculoskeletal: No acute fracture. Posterior fusion L4-L5. Advanced disc space height loss at L3-L4. IMPRESSION: 1. Bilateral ureteral stents with decreased hydroureteronephrosis compared to 05/27/2023. 2. Similar fluid and gas collection in the presacral space with thick mildly enhancing wall. This again measures 5.4 x 2.5 cm. 3. Postoperative change of partial colectomy with left upper quadrant colostomy. Aortic Atherosclerosis (ICD10-I70.0). Electronically Signed   By: Minerva Fester M.D.   On: 06/08/2023 21:28    Procedures Procedures    Medications Ordered in ED Medications  fentaNYL (SUBLIMAZE) injection 50 mcg (50 mcg Intravenous Given 06/08/23 2021)  ondansetron (ZOFRAN) injection 4 mg (4 mg Intravenous Given 06/08/23 2020)  iohexol (OMNIPAQUE) 300 MG/ML solution 100 mL (100 mLs Intravenous Contrast Given 06/08/23 2051)  prochlorperazine (COMPAZINE) injection 10 mg (10 mg Intravenous Given 06/08/23 2145)  diphenhydrAMINE (BENADRYL) injection 12.5 mg (12.5 mg Intravenous Given 06/08/23 2144)  HYDROmorphone (DILAUDID) injection 0.5 mg (0.5 mg Intravenous Given 06/08/23 2220)  diltiazem (CARDIZEM) injection 10 mg (10 mg Intravenous Given 06/08/23 2314)    ED Course/ Medical Decision Making/ A&P                                 Medical Decision Making Amount and/or Complexity of Data Reviewed Labs: ordered. Radiology: ordered.  Risk Prescription drug management.   [name] is here with abdominal pain.  Normal vitals.  No fever.  History of ostomy from colon cancer.  She also has history of ureteral stents.  She think she might have a bowel obstruction.  Differential is bowel obstruction versus ileus versus issues with one of her renal stents.  Will get basic labs and CT scan abdomen pelvis and evaluate.  Will give IV fentanyl and IV Zofran and reevaluate.  Patient not having any urinary symptoms.  No Concern for UTI.  Did mention  some vaginal bleeding here recently but has been evaluated with.  Does not seem to be much different.  Patient per my review of her hemoglobin is actually improved from her prior from a few weeks ago.  Had no concern for acute GYN process.  Lab work per my review and interpretation is unremarkable.  No significant leukocytosis  or anemia or electrolyte abnormality or kidney injury.  CT scan shows decreased hydroureteronephrosis.  No bowel obstruction.  She has chronic presacral fluid collection but otherwise she has no infectious symptoms otherwise.  Overall suspect may be acute on chronic process.  She is feeling better after her IV pain medicine and nausea meds.  Patient has not been able to tolerate her beta-blocker today.  She is mildly tachycardic and gave a dose IV diltiazem which helped. I will have her follow-up with her primary care doctor.  She understands return precautions.  Discharged in good condition.  This chart was dictated using voice recognition software.  Despite best efforts to proofread,  errors can occur which can change the documentation meaning.     Final Clinical Impression(s) / ED Diagnoses Final diagnoses:  Abdominal pain, unspecified abdominal location    Rx / DC Orders ED Discharge Orders          Ordered    promethazine (PHENERGAN) 25 MG tablet  Every 6 hours PRN        06/08/23 2222              Virgina Norfolk, DO 06/08/23 2245    Virgina Norfolk, DO 06/08/23 2246    Lockie Mola, Anthany Thornhill, DO 06/08/23 2246    Lockie Mola, Coretha Creswell, DO 06/08/23 2253    Virgina Norfolk, DO 06/08/23 2320

## 2023-06-09 ENCOUNTER — Emergency Department (HOSPITAL_BASED_OUTPATIENT_CLINIC_OR_DEPARTMENT_OTHER): Payer: Medicare HMO

## 2023-06-09 DIAGNOSIS — R1084 Generalized abdominal pain: Secondary | ICD-10-CM | POA: Diagnosis not present

## 2023-06-09 DIAGNOSIS — R7989 Other specified abnormal findings of blood chemistry: Secondary | ICD-10-CM | POA: Diagnosis not present

## 2023-06-09 DIAGNOSIS — R109 Unspecified abdominal pain: Secondary | ICD-10-CM | POA: Diagnosis not present

## 2023-06-09 LAB — URINALYSIS, ROUTINE W REFLEX MICROSCOPIC
Bilirubin Urine: NEGATIVE
Glucose, UA: NEGATIVE mg/dL
Ketones, ur: NEGATIVE mg/dL
Nitrite: NEGATIVE
Protein, ur: 30 mg/dL — AB
RBC / HPF: >50 RBC/hpf (ref 0–5)
Specific Gravity, Urine: >1.046 — ABNORMAL HIGH (ref 1.005–1.030)
pH: 6 (ref 5.0–8.0)

## 2023-06-09 LAB — LACTIC ACID, PLASMA: Lactic Acid, Venous: 1.6 mmol/L (ref 0.5–1.9)

## 2023-06-09 MED ORDER — HYDROMORPHONE HCL 1 MG/ML IJ SOLN
1.0000 mg | Freq: Once | INTRAMUSCULAR | Status: AC
  Administered 2023-06-09: 1 mg via INTRAVENOUS
  Filled 2023-06-09: qty 1

## 2023-06-09 MED ORDER — LACTATED RINGERS IV BOLUS
1000.0000 mL | Freq: Once | INTRAVENOUS | Status: AC
  Administered 2023-06-09: 1000 mL via INTRAVENOUS

## 2023-06-09 MED ORDER — HYDROMORPHONE HCL 1 MG/ML IJ SOLN
0.5000 mg | Freq: Once | INTRAMUSCULAR | Status: AC
  Administered 2023-06-09: 0.5 mg via INTRAVENOUS
  Filled 2023-06-09: qty 1

## 2023-06-09 MED ORDER — ONDANSETRON HCL 4 MG/2ML IJ SOLN
4.0000 mg | Freq: Once | INTRAMUSCULAR | Status: AC
  Administered 2023-06-09: 4 mg via INTRAVENOUS
  Filled 2023-06-09: qty 2

## 2023-06-09 MED ORDER — BISOPROLOL FUMARATE 5 MG PO TABS: 5.0000 mg | ORAL_TABLET | Freq: Once | ORAL | Status: DC

## 2023-06-09 MED ORDER — METOPROLOL TARTRATE 5 MG/5ML IV SOLN
5.0000 mg | Freq: Once | INTRAVENOUS | Status: AC
  Administered 2023-06-09: 5 mg via INTRAVENOUS
  Filled 2023-06-09: qty 5

## 2023-06-09 MED ORDER — IOHEXOL 350 MG/ML SOLN
100.0000 mL | Freq: Once | INTRAVENOUS | Status: AC | PRN
  Administered 2023-06-09: 75 mL via INTRAVENOUS

## 2023-06-09 NOTE — ED Provider Notes (Signed)
Patient discharged by previous shift.  Nursing staff concerned she is tachycardic.  She states she feels better.  Was seen initially for abdominal pain and vomiting with concern for bowel obstruction. Workup was reassuring with stable CT scan and creatinine.  Did have recent ureteral stents placed.  She missed her beta-blocker dose for the past 2 nights due to feeling ill.  Also has been not taking her home pain medication as she is about to run out and has been trying to stretch it out.  Patient given IV fluids.  Her reported allergy to metoprolol as "nausea and palpitations".  After discussion with pharmacy we do not have bisoprolol.  Patient willing to try dose of metoprolol to see if it improves her tachycardia.  After IV metoprolol, heart rate improved to the 80s.  O2 saturation 95 on room air.  Blood pressure 144/89.  Orthostatics positive BP 121 systolic lying to 88 systolic standing.   Patient is significantly orthostatic.  IV fluids given.  Lactate is normal with low concern for sepsis  Heart rate remains normal in the 80s and 90s.  Blood pressure 129/66.  Patient feels improved and is ready to go home. Suspect her Tachycardia likely secondary to orthostasis, dehydration and missing her beta-blocker dose.  No evidence of bowel obstruction on previous imaging.  No evidence of pulmonary embolism.  Do not suspect sepsis.  Urinalysis is positive but she did recently have urological manipulation.  Will send culture.  She denies UTI symptoms.  Will defer antibiotics at this time.  Follow-up with PCP and urologist per previous plan.  Return precautions discussed.   Glynn Octave, MD 06/09/23 [phone]

## 2023-06-10 LAB — URINE CULTURE

## 2023-07-25 DIAGNOSIS — I10 Essential (primary) hypertension: Secondary | ICD-10-CM | POA: Diagnosis not present

## 2023-07-25 DIAGNOSIS — K219 Gastro-esophageal reflux disease without esophagitis: Secondary | ICD-10-CM | POA: Diagnosis not present

## 2023-07-25 DIAGNOSIS — N132 Hydronephrosis with renal and ureteral calculous obstruction: Secondary | ICD-10-CM | POA: Diagnosis not present

## 2023-07-25 DIAGNOSIS — N131 Hydronephrosis with ureteral stricture, not elsewhere classified: Secondary | ICD-10-CM | POA: Diagnosis not present

## 2023-07-25 DIAGNOSIS — Z79899 Other long term (current) drug therapy: Secondary | ICD-10-CM | POA: Diagnosis not present

## 2023-08-05 DIAGNOSIS — M4185 Other forms of scoliosis, thoracolumbar region: Secondary | ICD-10-CM | POA: Diagnosis not present

## 2023-08-05 DIAGNOSIS — M4726 Other spondylosis with radiculopathy, lumbar region: Secondary | ICD-10-CM | POA: Diagnosis not present

## 2023-08-05 DIAGNOSIS — M47816 Spondylosis without myelopathy or radiculopathy, lumbar region: Secondary | ICD-10-CM | POA: Diagnosis not present

## 2023-08-05 DIAGNOSIS — Z981 Arthrodesis status: Secondary | ICD-10-CM | POA: Diagnosis not present

## 2023-08-05 DIAGNOSIS — M961 Postlaminectomy syndrome, not elsewhere classified: Secondary | ICD-10-CM | POA: Diagnosis not present

## 2023-08-05 DIAGNOSIS — M549 Dorsalgia, unspecified: Secondary | ICD-10-CM | POA: Diagnosis not present

## 2023-08-05 DIAGNOSIS — M4727 Other spondylosis with radiculopathy, lumbosacral region: Secondary | ICD-10-CM | POA: Diagnosis not present

## 2023-08-05 DIAGNOSIS — M50322 Other cervical disc degeneration at C5-C6 level: Secondary | ICD-10-CM | POA: Diagnosis not present

## 2023-08-05 DIAGNOSIS — M419 Scoliosis, unspecified: Secondary | ICD-10-CM | POA: Diagnosis not present

## 2023-08-05 DIAGNOSIS — G894 Chronic pain syndrome: Secondary | ICD-10-CM | POA: Diagnosis not present

## 2023-08-05 DIAGNOSIS — M47814 Spondylosis without myelopathy or radiculopathy, thoracic region: Secondary | ICD-10-CM | POA: Diagnosis not present

## 2023-08-05 DIAGNOSIS — M5116 Intervertebral disc disorders with radiculopathy, lumbar region: Secondary | ICD-10-CM | POA: Diagnosis not present

## 2023-08-05 DIAGNOSIS — B0229 Other postherpetic nervous system involvement: Secondary | ICD-10-CM | POA: Diagnosis not present

## 2023-08-05 DIAGNOSIS — M5416 Radiculopathy, lumbar region: Secondary | ICD-10-CM | POA: Diagnosis not present

## 2023-08-12 ENCOUNTER — Emergency Department (HOSPITAL_BASED_OUTPATIENT_CLINIC_OR_DEPARTMENT_OTHER): Payer: Medicare Other

## 2023-08-12 ENCOUNTER — Emergency Department (HOSPITAL_BASED_OUTPATIENT_CLINIC_OR_DEPARTMENT_OTHER)
Admission: EM | Admit: 2023-08-12 | Discharge: 2023-08-12 | Disposition: A | Discharge status: Home / Self Care | Payer: Medicare Other | Attending: Emergency Medicine | Admitting: Emergency Medicine

## 2023-08-12 ENCOUNTER — Encounter: Payer: Self-pay | Admitting: Hematology

## 2023-08-12 ENCOUNTER — Other Ambulatory Visit: Payer: Self-pay

## 2023-08-12 DIAGNOSIS — R1084 Generalized abdominal pain: Secondary | ICD-10-CM | POA: Diagnosis not present

## 2023-08-12 DIAGNOSIS — R590 Localized enlarged lymph nodes: Secondary | ICD-10-CM | POA: Diagnosis not present

## 2023-08-12 DIAGNOSIS — C20 Malignant neoplasm of rectum: Secondary | ICD-10-CM | POA: Diagnosis not present

## 2023-08-12 LAB — COMPREHENSIVE METABOLIC PANEL
ALT: 11 U/L (ref 0–44)
AST: 14 U/L — ABNORMAL LOW (ref 15–41)
Albumin: 4.2 g/dL (ref 3.5–5.0)
Alkaline Phosphatase: 90 U/L (ref 38–126)
Anion gap: 13 (ref 5–15)
BUN: 13 mg/dL (ref 6–20)
CO2: 20 mmol/L — ABNORMAL LOW (ref 22–32)
Calcium: 10.4 mg/dL — ABNORMAL HIGH (ref 8.9–10.3)
Chloride: 103 mmol/L (ref 98–111)
Creatinine, Ser: 1.07 mg/dL — ABNORMAL HIGH (ref 0.44–1.00)
GFR, Estimated: >60 mL/min (ref >60)
Glucose, Bld: 156 mg/dL — ABNORMAL HIGH (ref 70–99)
Potassium: 4 mmol/L (ref 3.5–5.1)
Sodium: 136 mmol/L (ref 135–145)
Total Bilirubin: 0.4 mg/dL (ref 0.0–1.2)
Total Protein: 9.1 g/dL — ABNORMAL HIGH (ref 6.5–8.1)

## 2023-08-12 LAB — LIPASE, BLOOD: Lipase: 18 U/L (ref 11–51)

## 2023-08-12 LAB — CBC
HCT: 46.6 % — ABNORMAL HIGH (ref 36.0–46.0)
Hemoglobin: 14.4 g/dL (ref 12.0–15.0)
MCH: 24.6 pg — ABNORMAL LOW (ref 26.0–34.0)
MCHC: 30.9 g/dL (ref 30.0–36.0)
MCV: 79.5 fL — ABNORMAL LOW (ref 80.0–100.0)
Platelets: 360 10*3/uL (ref 150–400)
RBC: 5.86 MIL/uL — ABNORMAL HIGH (ref 3.87–5.11)
RDW: 16.8 % — ABNORMAL HIGH (ref 11.5–15.5)
WBC: 13.1 10*3/uL — ABNORMAL HIGH (ref 4.0–10.5)
nRBC: 0 % (ref 0.0–0.2)

## 2023-08-12 MED ORDER — IOHEXOL 300 MG/ML  SOLN
100.0000 mL | Freq: Once | INTRAMUSCULAR | Status: AC | PRN
  Administered 2023-08-12: 100 mL via INTRAVENOUS

## 2023-08-12 MED ORDER — ONDANSETRON HCL 4 MG/2ML IJ SOLN
4.0000 mg | Freq: Once | INTRAMUSCULAR | Status: AC
  Administered 2023-08-12: 4 mg via INTRAVENOUS
  Filled 2023-08-12: qty 2

## 2023-08-12 MED ORDER — MORPHINE SULFATE (PF) 4 MG/ML IV SOLN
8.0000 mg | Freq: Once | INTRAVENOUS | Status: AC
  Administered 2023-08-12: 8 mg via INTRAVENOUS
  Filled 2023-08-12: qty 2

## 2023-08-12 MED ORDER — SODIUM CHLORIDE 0.9 % IV BOLUS
1000.0000 mL | Freq: Once | INTRAVENOUS | Status: AC
  Administered 2023-08-12: 1000 mL via INTRAVENOUS

## 2023-08-12 NOTE — ED Triage Notes (Signed)
Onset three days abdominal pain back pain.  Nausea no vomiting. Has cos tomy which is draining lots of stool.  Feeling weak.  Present in wheel chair

## 2023-08-12 NOTE — Discharge Instructions (Signed)
Please follow-up with your family doctor in the office.  Please return for worsening pain fever or inability eat or drink.

## 2023-08-12 NOTE — ED Provider Notes (Signed)
[facility] AT [facility] Provider Note   CSN: [id number] Arrival date & time: 08/12/23  6962     History  Chief Complaint  Patient presents with   Abdominal Pain    Lindsay Little is a 58 y.o. female.  58 yo F with a chief complaints of abdominal pain watery stools left flank pain.  This has been going on since yesterday.  Has had issues with her kidneys and thinks this could be due to her stent on her left side.  She is also worried it could be a bowel obstruction as she has had those multiple times in the past.   Abdominal Pain      Home Medications Prior to Admission medications   Medication Sig Start Date End Date Taking? Authorizing Provider  bisoprolol (ZEBETA) 5 MG tablet Take 5 mg by mouth daily.    [provider]  Coenzyme Q10 (COQ10) 200 MG CAPS Take 200 mg by mouth at bedtime.    [provider]  DULoxetine (CYMBALTA) 60 MG capsule Take 120 mg by mouth at bedtime.    [provider]  esomeprazole (NEXIUM) 40 MG capsule Take 1 capsule (40 mg total) by mouth 2 (two) times daily before a meal. Patient taking differently: Take 40 mg by mouth See admin instructions. Take 40 mg by mouth in the morning before breakfast and an additional 40 mg once a day as needed for unresolved reflux 12/22/15   [name], MD  famotidine (PEPCID) 20 MG tablet Take 20 mg by mouth 2 (two) times daily as needed for heartburn or indigestion.    [provider]  furosemide (LASIX) 20 MG tablet Take 20 mg by mouth daily as needed for edema.     [provider]  HYDROmorphone (DILAUDID) 4 MG tablet Take one tablet (4 mg dose) by mouth every 6 (six) hours as needed for Pain for up to 30 days. Max Daily Amount: 16 mg Patient taking differently: Take 4 mg by mouth every 6 (six) hours. 05/13/23     hyoscyamine (LEVSIN) 0.125 MG tablet Take 0.125 mg by mouth 3 (three) times daily as needed for cramping. 01/28/23   [provider]  Ibuprofen 200 MG CAPS Take 400 mg by mouth every 6 (six) hours as needed (for pain).    [provider]  levETIRAcetam (KEPPRA) 250 MG tablet Take 500 mg by mouth in the morning and at bedtime.    [provider]  levothyroxine (SYNTHROID) 50 MCG tablet Take 50 mcg by mouth daily before breakfast. 02/07/22   [provider]  Melatonin 3 MG TABS Take 3-6 mg by mouth at bedtime as needed (sleep).    [provider]  naloxone [facility]) nasal spray 4 mg/0.1 mL Place 0.4 mg into the nose once. 12/18/22   [provider]  oxybutynin (DITROPAN XL) 15 MG 24 hr tablet Take 15 mg by mouth at bedtime.    [provider]  polyethylene glycol (MIRALAX / GLYCOLAX) 17 g packet Take 17 g by mouth See admin instructions. Mix 17 grams into the recommended amount of a beverage and drink once a day- may take an additional 17 grams once a day as needed for constipation    [provider]  pregabalin (LYRICA) 225 MG capsule Take 225 mg by mouth 2 (two) times daily. 02/07/22   [provider]  promethazine (PHENERGAN) 25 MG tablet Take 1 tablet (25 mg total) by mouth every 6 (six)  hours as needed for nausea or vomiting. 06/08/23   Curatolo, Adam, DO  rosuvastatin (CRESTOR) 5 MG tablet Take 5 mg by mouth every evening.    [provider]  tamsulosin (FLOMAX) 0.4 MG CAPS capsule Take 0.4 mg by mouth daily.    [provider]  triamcinolone lotion (KENALOG) 0.1 % Apply 1 application  topically 2 (two) times daily as needed (for itching- scalp).    [provider]  TYLENOL 500 MG tablet Take 1,000 mg by mouth every 6 (six) hours as needed for mild pain (pain score 1-3) or headache.    [provider]      Allergies    Lidocaine, Bupropion, Metronidazole, Oxycodone, Penicillins, Sulfa antibiotics, Pantoprazole, Adhesive [tape], Doxycycline, Iron, Metoprolol, and Wound dressing adhesive    Review of Systems    Review of Systems  Gastrointestinal:  Positive for abdominal pain.    Physical Exam Updated Vital Signs BP 116/73   Pulse 92   Temp 99.7 F (37.6 C) (Oral)   Resp 16   Ht 5' 10.5" (1.791 m)   Wt 131.1 kg   SpO2 91%   BMI 40.88 kg/m  Physical Exam Vitals and nursing note reviewed.  Constitutional:      General: She is not in acute distress.    Appearance: She is well-developed. She is not diaphoretic.  HENT:     Head: Normocephalic and atraumatic.  Eyes:     Pupils: Pupils are equal, round, and reactive to light.  Cardiovascular:     Rate and Rhythm: Normal rate and regular rhythm.     Heart sounds: No murmur heard.    No friction rub. No gallop.  Pulmonary:     Effort: Pulmonary effort is normal.     Breath sounds: No wheezing or rales.  Abdominal:     General: There is no distension.     Palpations: Abdomen is soft.     Tenderness: There is abdominal tenderness.     Comments: Diffuse abdominal discomfort without obvious focality.  Ostomy in place with some clear liquid in the bag.  Musculoskeletal:        General: No tenderness.     Cervical back: Normal range of motion and neck supple.  Skin:    General: Skin is warm and dry.  Neurological:     Mental Status: She is alert and oriented to person, place, and time.  Psychiatric:        Behavior: Behavior normal.     ED Results / Procedures / Treatments   Labs (all labs ordered are listed, but only abnormal results are displayed) Labs Reviewed  COMPREHENSIVE METABOLIC PANEL - Abnormal; Notable for the following components:      Result Value   CO2 20 (*)    Glucose, Bld 156 (*)    Creatinine, Ser 1.07 (*)    Calcium 10.4 (*)    Total Protein 9.1 (*)    AST 14 (*)    All other components within normal limits  CBC - Abnormal; Notable for the following components:   WBC 13.1 (*)    RBC 5.86 (*)    HCT 46.6 (*)    MCV 79.5 (*)    MCH 24.6 (*)    RDW 16.8 (*)    All other components within normal limits   LIPASE, BLOOD  URINALYSIS, ROUTINE W REFLEX MICROSCOPIC    EKG None  Radiology CT ABDOMEN PELVIS W CONTRAST Result Date: 08/12/2023 CLINICAL DATA:  Abdominal and back  pain for 3 days. Nausea without vomiting. Feeling weak. Known ostomy. Renal stent. History of rectal carcinoma with chemotherapy and radiation. EXAM: CT ABDOMEN AND PELVIS WITH CONTRAST TECHNIQUE: Multidetector CT imaging of the abdomen and pelvis was performed using the standard protocol following bolus administration of intravenous contrast. RADIATION DOSE REDUCTION: This exam was performed according to the departmental dose-optimization program which includes automated exposure control, adjustment of the mA and/or kV according to patient size and/or use of iterative reconstruction technique. CONTRAST:  OMNIPAQUE IOHEXOL 300 MG/ML  SOLN COMPARISON:  CT 06/08/2023 and older. FINDINGS: Lower chest: Mild linear opacity at the lung bases with some ground-glass. Atelectasis is favored. No pleural effusion. Hepatobiliary: Diffuse fatty liver infiltration with more focal fat deposition seen in the liver adjacent to the falciform ligament. No space-occupying liver lesion. Gallbladder is nondilated. Patent portal vein. Pancreas: Global mild atrophy of the pancreas. Spleen: Normal in size without focal abnormality. Adrenals/Urinary Tract: Adrenal glands are preserved. Indwelling bilateral ureteral stents. Preserved renal enhancement with some mild atrophy. Kidney is a lobular. There is some stranding adjacent to the renal collecting systems diffusely. Stable patulous collecting systems. Distal pigtails in the urinary bladder which is underdistended. Proximal in the renal pelvis on the right and upper pole on the left. Slight wall thickening of the urinary bladder wall. Stomach/Bowel: Left-sided diverting colostomy. The colon proximal to this is nondilated with some fluid and air. Cecum resides in the right hemipelvis. Stomach and small bowel  are nondilated. Surgical changes along loops of small bowel in the pelvis. There has been resection of the rectum with significant nodular soft tissue with some central air and fluid. The appearance is similar to previous. The fluid collection centrally previously was measured at 5.3 x 2.5 cm and today when measured in a similar fashion 5.5 by 2.4 cm on series 2, image 85. Please correlate for any known etiology. Soft tissue thickening extends to the margin of the urinary bladder posteriorly. Vascular/Lymphatic: Normal caliber aorta and IVC. Mild vascular calcifications. Prominent lymph nodes identified again in the pelvis. Including what appears to be an enlarged node along the mesentery in the right side posteriorly measuring 16 x 12 mm on series 2, image 67, unchanged in retrospect. Some additional nodes adjacent to this area are also prominent with abnormal morphology but unchanged from previous. Scattered small subcentimeter retroperitoneal nodes are stable. Reproductive: Status post hysterectomy. There is cystic lesion left adnexa which could be ovarian unchanged from previous measuring 6.2 by 3.6 cm. Please correlate for any known history it or additional evaluation when appropriate. This has been slowly increasing since March 2022. Other: No free intra-abdominal air.  No new ascites. Musculoskeletal: Streak artifact related to the fixation hardware along lumbar spine. Curvature and degenerative changes are seen along the spine. Degenerative changes along the poles with areas of patchy sclerosis. IMPRESSION: Surgical changes from distal resection of the rectum with diverting colostomy. Persistent nodular soft tissue thickening in the presacral space abutting the margin of the urinary bladder with a central air and fluid collection which is similar to previous. Please correlate for any clinical evidence of infection. Indwelling ureteral stents in place bilaterally with stable appearance of the collecting  systems. Preserved enhancement of the kidneys. Otherwise no bowel obstruction, free air or free fluid. There is some air and fluid along right side of the colon and central small bowel. Fatty liver infiltration. Stable prominent and mildly enlarged lymph nodes in the pelvis. Complex cystic area left adnexa now measuring  up to 6.2 x 3.6 cm with slow interval growth over at least 3 years. Because this lesion is not adequately characterized, [place] is recommended for further evaluation when clinically appropriate. Note: This recommendation does not apply to premenarchal patients and to those with increased risk (genetic, family history, elevated tumor markers or other high-risk factors) of ovarian cancer. Reference: JACR 2020 Feb; 17(2):248-254 Electronically Signed   By: Karen Kays M.D.   On: 08/12/2023 12:03    Procedures Procedures    Medications Ordered in ED Medications  morphine (PF) 4 MG/ML injection 8 mg (8 mg Intravenous Given 08/12/23 1126)  ondansetron (ZOFRAN) injection 4 mg (4 mg Intravenous Given 08/12/23 1123)  sodium chloride 0.9 % bolus 1,000 mL (0 mLs Intravenous Stopped 08/12/23 1307)  iohexol (OMNIPAQUE) 300 MG/ML solution 100 mL (100 mLs Intravenous Contrast Given 08/12/23 1111)    ED Course/ Medical Decision Making/ A&P                                 Medical Decision Making Amount and/or Complexity of Data Reviewed Labs: ordered. Radiology: ordered.  Risk Prescription drug management.   58 yo F with a chief complaints of abdominal pain.  Patient unfortunately has a significant past medical history of colorectal cancer status post resection with colostomy bilateral hydronephrosis requiring ureteral stents.  Mild leukocytosis.  Will obtain CT imaging.  No significant electrolyte abnormality LFTs lipase are unremarkable.  CT imaging with a fluid collection seen in the pelvis.  Not changed from prior.  No other obvious acute finding.  Patient reassessed and is feeling a  bit better.  Will discharge home.  Follow-up.  2:51 PM:  I have discussed the diagnosis/risks/treatment options with the patient.  Evaluation and diagnostic testing in the emergency department does not suggest an emergent condition requiring admission or immediate intervention beyond what has been performed at this time.  They will follow up with PCP. We also discussed returning to the ED immediately if new or worsening sx occur. We discussed the sx which are most concerning (e.g., sudden worsening pain, fever, inability to tolerate by mouth) that necessitate immediate return. Medications administered to the patient during their visit and any new prescriptions provided to the patient are listed below.  Medications given during this visit Medications  morphine (PF) 4 MG/ML injection 8 mg (8 mg Intravenous Given 08/12/23 1126)  ondansetron (ZOFRAN) injection 4 mg (4 mg Intravenous Given 08/12/23 1123)  sodium chloride 0.9 % bolus 1,000 mL (0 mLs Intravenous Stopped 08/12/23 1307)  iohexol (OMNIPAQUE) 300 MG/ML solution 100 mL (100 mLs Intravenous Contrast Given 08/12/23 1111)     The patient appears reasonably screen and/or stabilized for discharge and I doubt any other medical condition or other [facility] requiring further screening, evaluation, or treatment in the ED at this time prior to discharge.          Final Clinical Impression(s) / ED Diagnoses Final diagnoses:  Generalized abdominal pain    Rx / DC Orders ED Discharge Orders     None         Melene Plan, DO 08/12/23 1451

## 2023-08-19 DIAGNOSIS — M47816 Spondylosis without myelopathy or radiculopathy, lumbar region: Secondary | ICD-10-CM | POA: Diagnosis not present

## 2023-08-20 ENCOUNTER — Other Ambulatory Visit (HOSPITAL_COMMUNITY): Payer: Self-pay | Admitting: Family Medicine

## 2023-08-20 DIAGNOSIS — N949 Unspecified condition associated with female genital organs and menstrual cycle: Secondary | ICD-10-CM

## 2023-08-20 DIAGNOSIS — K219 Gastro-esophageal reflux disease without esophagitis: Secondary | ICD-10-CM | POA: Diagnosis not present

## 2023-08-20 DIAGNOSIS — R110 Nausea: Secondary | ICD-10-CM | POA: Diagnosis not present

## 2023-08-20 DIAGNOSIS — E559 Vitamin D deficiency, unspecified: Secondary | ICD-10-CM | POA: Diagnosis not present

## 2023-08-20 DIAGNOSIS — G894 Chronic pain syndrome: Secondary | ICD-10-CM | POA: Diagnosis not present

## 2023-08-20 DIAGNOSIS — R7303 Prediabetes: Secondary | ICD-10-CM | POA: Diagnosis not present

## 2023-08-20 DIAGNOSIS — E785 Hyperlipidemia, unspecified: Secondary | ICD-10-CM | POA: Diagnosis not present

## 2023-08-20 DIAGNOSIS — I10 Essential (primary) hypertension: Secondary | ICD-10-CM | POA: Diagnosis not present

## 2023-08-20 DIAGNOSIS — E039 Hypothyroidism, unspecified: Secondary | ICD-10-CM | POA: Diagnosis not present

## 2023-08-20 DIAGNOSIS — G620 Drug-induced polyneuropathy: Secondary | ICD-10-CM | POA: Diagnosis not present

## 2023-08-31 ENCOUNTER — Encounter: Payer: Self-pay | Admitting: Hematology

## 2023-09-01 ENCOUNTER — Encounter (HOSPITAL_BASED_OUTPATIENT_CLINIC_OR_DEPARTMENT_OTHER): Payer: Self-pay

## 2023-09-01 ENCOUNTER — Ambulatory Visit (HOSPITAL_BASED_OUTPATIENT_CLINIC_OR_DEPARTMENT_OTHER): Admission: RE | Admit: 2023-09-01 | Payer: Medicare Other | Source: Ambulatory Visit

## 2023-09-02 DIAGNOSIS — B0229 Other postherpetic nervous system involvement: Secondary | ICD-10-CM | POA: Diagnosis not present

## 2023-09-02 DIAGNOSIS — G894 Chronic pain syndrome: Secondary | ICD-10-CM | POA: Diagnosis not present

## 2023-09-02 DIAGNOSIS — M47816 Spondylosis without myelopathy or radiculopathy, lumbar region: Secondary | ICD-10-CM | POA: Diagnosis not present

## 2023-09-02 DIAGNOSIS — M961 Postlaminectomy syndrome, not elsewhere classified: Secondary | ICD-10-CM | POA: Diagnosis not present

## 2023-09-05 ENCOUNTER — Ambulatory Visit: Payer: Medicare Other | Admitting: Podiatry

## 2023-09-05 ENCOUNTER — Ambulatory Visit (HOSPITAL_BASED_OUTPATIENT_CLINIC_OR_DEPARTMENT_OTHER)
Admission: RE | Admit: 2023-09-05 | Discharge: 2023-09-05 | Disposition: A | Discharge status: Home / Self Care | Payer: Medicare Other | Source: Ambulatory Visit | Attending: Family Medicine | Admitting: Family Medicine

## 2023-09-05 ENCOUNTER — Encounter: Payer: Self-pay | Admitting: Podiatry

## 2023-09-05 DIAGNOSIS — N949 Unspecified condition associated with female genital organs and menstrual cycle: Secondary | ICD-10-CM | POA: Diagnosis present

## 2023-09-05 DIAGNOSIS — G620 Drug-induced polyneuropathy: Secondary | ICD-10-CM | POA: Diagnosis not present

## 2023-09-05 DIAGNOSIS — L97521 Non-pressure chronic ulcer of other part of left foot limited to breakdown of skin: Secondary | ICD-10-CM

## 2023-09-05 DIAGNOSIS — L97511 Non-pressure chronic ulcer of other part of right foot limited to breakdown of skin: Secondary | ICD-10-CM | POA: Diagnosis not present

## 2023-09-05 DIAGNOSIS — T451X5A Adverse effect of antineoplastic and immunosuppressive drugs, initial encounter: Secondary | ICD-10-CM | POA: Diagnosis not present

## 2023-09-05 NOTE — Progress Notes (Signed)
Subjective:  [name], female    DOB: [birth year],   MRN: [id number]  Chief Complaint  Patient presents with   Foot Ulcer          58 y.o. female presents for new concern for lesions on the bottom of her left foot and still concern for second toes bilateral. Also concern of neuropathy and bilateral foot pain. Relates left is worse than right.  Relates she has bilateral calluses on her second toe.  She is not diabetic. Relates th espot on the left foot popped up about a week ago and though she should come in. Denies any current treatments.  She has chemo induced neuropathy an din remission from colorectal cancer.  Denies any other pedal complaints. Denies n/v/f/c.   Past Medical History:  Diagnosis Date   Anxiety    Chemotherapy-induced neuropathy (HCC)    toes and fingers numbness and tingling   Colorectal delayed anastomotic leak s/p resection & colostomy 07/12/2016 11/17/2015   Colostomy in place [facility]) 07/12/2016   due to anastomosis breakdown w/ colovaginal fistula   Colovaginal fistula s/p omentopexy repair 07/12/2016 07/10/2016   Complication of anesthesia    Depression    Family history of adverse reaction to anesthesia    mother-- ponv   Family history of breast cancer    Family history of pancreatic cancer    Family history of skin cancer    Gastroenteritis 12/20/2015   GERD (gastroesophageal reflux disease)    Hardware complicating wound infection (HCC) 08/03/2019   Hiatal hernia    History of cancer chemotherapy 02-20-2015 to 03-30-2015   History of cardiac murmur as a child    History of chemotherapy    History of chronic gastritis    History of hypertension    no issues since multiple abdminal sx's and chemo--- no medication since 12/ 2016   History of TMJ disorder    Hypercholesteremia    Hypertension    IBS (irritable bowel syndrome)    dx age 23   Intermittent abdominal pain    post-op multiple abdominal sx's    Microcytic anemia    Mild  sleep apnea    per pt study 2014  very mild osa , no cpap recommended, recommeded wt loss and sleep routine   OA (osteoarthritis)    left knee /  left shoulder   Obesity    Parastomal hernia s/p lap repair w mesh 07/23/2018 07/23/2018   Pelvic abscess s/p drainage & omental pedicle flap 11/17/2015 06/23/2015   PONV (postoperative nausea and vomiting)    severe" needs Scopolamine PATCH"    Portacath in place    right chest   Rectal adenocarcinoma (HCC) oncologis-  dr Mosetta Putt-- after radiation/ chemo (ypT1, N0) --  no recurrence per last note 03/ 2018   dx 01-13-2015-- Stage IIIC (T3, N2, M0) post concurrent radiation and chemotherapy 02-20-2015 to 03-30-2015 /  05-25-2015 s/p  LAR w/ RSO (post-op complicated by late abscess and contained anatomotic leak w/ help percutaneous drainage and antibiotics)    Rectocutaneous fistula 07/10/2016   Rotator cuff tear, left    S/P radiation therapy 02/20/15-03/30/15   colon/rectal   Vitamin D deficiency    Wears glasses    Wears glasses     Objective:  Physical Exam: Vascular: DP/PT pulses 2/4 bilateral. CFT <3 seconds. Normal hair growth on digits. No edema.  Skin. No lacerations or abrasions bilateral feet. Hyperkeratotic lesions noted to distal second digits that appear pre-ulcerative. On the  right ulceration noted as below. No erythema edema or purulence noted.  Small hyperkeratotic lesion to dorsal right fourth digit. Hyperkeratotic lesion noted to plantar left first metatarsal head upon debridedment ulceration noted. No erythema edema or purulence noted.  Musculoskeletal: MMT 5/5 bilateral lower extremities in DF, PF, Inversion and Eversion. Deceased ROM in DF of ankle joint.  Neurological: Sensation intact to light touch. Protective sensation diminished.   Assessment:   1. Skin ulcer of plantar aspect of left foot, limited to breakdown of skin (HCC)   2. Chemotherapy-induced peripheral neuropathy (HCC)   3. Skin ulcer of second toe, limited to breakdown  of skin, right (HCC)       Plan:  Patient was evaluated and treated and all questions answered. -Discussed and educated patient on foot care, especially with  regards to the vascular, neurological and musculoskeletal systems.  -Stressed the importance of good glycemic control and the detriment of not controlling glucose levels in relation to the foot. -Discussed supportive shoes at all times and checking feet regularly.  Ulcer plantar first metatarsal left limited to breakdown of skin. Right distal second digit limited to breakdown of skin  -Debridement as below. -Dressed with betadine, DSD. -Off-loading with surgical shoe bilateral dispensed  -No abx indicated.  -Discussed glucose control and proper protein-rich diet.  -Discussed if any worsening redness, pain, fever or chills to call or may need to report to the emergency room. Patient expressed understanding.   Procedure: Excisional Debridement of Wound Rationale: Removal of non-viable soft tissue from the wound to promote healing.  Anesthesia: none Pre-Debridement Wound Measurements: ovelrying callus  Post-Debridement Wound Measurements: 0.2 cm x 0.2 cm x 0.1 cm (left) 0.1cm x 0.2 cmx 0.1 cm   Type of Debridement: Sharp Excisional Tissue Removed: Non-viable soft tissue Depth of Debridement: subcutaneous tissue. Technique: Sharp excisional debridement to bleeding, viable wound base.  Dressing: Dry, sterile, compression dressing. Disposition: Patient tolerated procedure well. Patient to return in 2 week for follow-up.  No follow-ups on file.    Louann Sjogren, DPM

## 2023-09-16 DIAGNOSIS — M533 Sacrococcygeal disorders, not elsewhere classified: Secondary | ICD-10-CM | POA: Diagnosis not present

## 2023-09-19 ENCOUNTER — Encounter: Payer: Self-pay | Admitting: Podiatry

## 2023-09-19 ENCOUNTER — Ambulatory Visit: Payer: Medicare Other | Admitting: Podiatry

## 2023-09-19 DIAGNOSIS — M2041 Other hammer toe(s) (acquired), right foot: Secondary | ICD-10-CM

## 2023-09-19 DIAGNOSIS — L84 Corns and callosities: Secondary | ICD-10-CM

## 2023-09-19 DIAGNOSIS — G620 Drug-induced polyneuropathy: Secondary | ICD-10-CM

## 2023-09-19 DIAGNOSIS — M2042 Other hammer toe(s) (acquired), left foot: Secondary | ICD-10-CM | POA: Diagnosis not present

## 2023-09-19 DIAGNOSIS — T451X5A Adverse effect of antineoplastic and immunosuppressive drugs, initial encounter: Secondary | ICD-10-CM

## 2023-09-19 NOTE — Progress Notes (Signed)
 Subjective:  [name], female    DOB: May 06, 1966,   MRN: [id number]  No chief complaint on file.   58 y.o. female presents for follow-up of bilateral foot wounds. Relates doing better and has been keeping betadine on all of her spots. Relates she has been wearing the shoes.   She has chemo induced neuropathy an din remission from colorectal cancer.  Denies any other pedal complaints. Denies n/v/f/c.   Past Medical History:  Diagnosis Date   Anxiety    Chemotherapy-induced neuropathy (HCC)    toes and fingers numbness and tingling   Colorectal delayed anastomotic leak s/p resection & colostomy 07/12/2016 11/17/2015   Colostomy in place [facility]) 07/12/2016   due to anastomosis breakdown w/ colovaginal fistula   Colovaginal fistula s/p omentopexy repair 07/12/2016 07/10/2016   Complication of anesthesia    Depression    Family history of adverse reaction to anesthesia    mother-- ponv   Family history of breast cancer    Family history of pancreatic cancer    Family history of skin cancer    Gastroenteritis 12/20/2015   GERD (gastroesophageal reflux disease)    Hardware complicating wound infection (HCC) 08/03/2019   Hiatal hernia    History of cancer chemotherapy 02-20-2015 to 03-30-2015   History of cardiac murmur as a child    History of chemotherapy    History of chronic gastritis    History of hypertension    no issues since multiple abdminal sx's and chemo--- no medication since 12/ 2016   History of TMJ disorder    Hypercholesteremia    Hypertension    IBS (irritable bowel syndrome)    dx age 32   Intermittent abdominal pain    post-op multiple abdominal sx's    Microcytic anemia    Mild sleep apnea    per pt study 2014  very mild osa , no cpap recommended, recommeded wt loss and sleep routine   OA (osteoarthritis)    left knee /  left shoulder   Obesity    Parastomal hernia s/p lap repair w mesh 07/23/2018 07/23/2018   Pelvic abscess s/p drainage &  omental pedicle flap 11/17/2015 06/23/2015   PONV (postoperative nausea and vomiting)    severe" needs Scopolamine PATCH"    Portacath in place    right chest   Rectal adenocarcinoma (HCC) oncologis-  dr Mosetta Putt-- after radiation/ chemo (ypT1, N0) --  no recurrence per last note 03/ 2018   dx 01-13-2015-- Stage IIIC (T3, N2, M0) post concurrent radiation and chemotherapy 02-20-2015 to 03-30-2015 /  05-25-2015 s/p  LAR w/ RSO (post-op complicated by late abscess and contained anatomotic leak w/ help percutaneous drainage and antibiotics)    Rectocutaneous fistula 07/10/2016   Rotator cuff tear, left    S/P radiation therapy 02/20/15-03/30/15   colon/rectal   Vitamin D deficiency    Wears glasses    Wears glasses     Objective:  Physical Exam: Vascular: DP/PT pulses 2/4 bilateral. CFT <3 seconds. Normal hair growth on digits. No edema.  Skin. No lacerations or abrasions bilateral feet. Hyperkeratotic lesions noted to distal second digits that appear pre-ulcerative. .  Small hyperkeratotic lesion to dorsal right fourth digit. Hyperkeratotic lesion noted to plantar left first metatarsal head All ulcerations healed Musculoskeletal: MMT 5/5 bilateral lower extremities in DF, PF, Inversion and Eversion. Deceased ROM in DF of ankle joint. Bialteral hammered digits 2-5 with semirigid deformity of bilateral second digits.  Neurological: Sensation intact to light  touch. Protective sensation diminished.   Assessment:   1. Pre-ulcerative calluses   2. Chemotherapy-induced peripheral neuropathy (HCC)   3. Hammertoe, bilateral        Plan:  Patient was evaluated and treated and all questions answered. -Discussed and educated patient on foot care, especially with  regards to the vascular, neurological and musculoskeletal systems.  -Stressed the importance of good glycemic control and the detriment of not controlling glucose levels in relation to the foot. -Discussed supportive shoes at all times and  checking feet regularly.  Ulcer plantar first metatarsal left -healed. Right distal second digit limited to breakdown of skin  -Debridement oh hyperkeratotic tissue with undelrying ulceratiosn all healed. Areas still very pre-ulcerative.  -Discussed tenotomies of second digits to assist in preventing reoccurance of ulceration.  Procedure below. -Dressed with betadine, DSD. -Off-loading with surgical shoe bilateral dispensed  -No abx indicated.  -Discussed glucose control and proper protein-rich diet.  -Discussed if any worsening redness, pain, fever or chills to call or may need to report to the emergency room. Patient expressed understanding.   Follow-up in 1 week for recheck   Procedure: Flexor Tenotomy Indication for Procedure: toe with semi-reducible hammertoe with distal tip ulceration. Flexor tenotomy indicated to alleviate contracture, reduce pressure, and enhance healing of the ulceration. Location: left, right, 2nd toe Anesthesia: Marcaine 0.25% plain; 3mL digital block Instrumentation: 18 gauge needle  Technique: The toe was anesthetized as above and prepped in the usual fashion. The toe was exanquinated and a tourniquet was secured at the base of the toe. A 18 gauge needle was then used to make a transverse incision over the plantar aspect of the distal interphalangeal joint. The flexor tendon was incised with noted release of the hammertoe deformity. The incision was then irrigated and dressed with sterile dressing. Patient tolerated the procedure well. Dressing: Dry, sterile, compression dressing. Disposition: Patient tolerated procedure well. Patient to return in 1 week for follow-up.    Return in about 1 week (around 09/26/2023) for wound check, post op.    Louann Sjogren, DPM

## 2023-09-26 ENCOUNTER — Ambulatory Visit: Admitting: Podiatry

## 2023-10-02 ENCOUNTER — Ambulatory Visit: Admitting: Podiatry

## 2023-10-10 DIAGNOSIS — M47816 Spondylosis without myelopathy or radiculopathy, lumbar region: Secondary | ICD-10-CM | POA: Diagnosis not present

## 2023-10-24 DIAGNOSIS — C20 Malignant neoplasm of rectum: Secondary | ICD-10-CM | POA: Diagnosis not present

## 2023-10-28 DIAGNOSIS — Z5181 Encounter for therapeutic drug level monitoring: Secondary | ICD-10-CM | POA: Diagnosis not present

## 2023-10-28 DIAGNOSIS — M533 Sacrococcygeal disorders, not elsewhere classified: Secondary | ICD-10-CM | POA: Diagnosis not present

## 2023-10-28 DIAGNOSIS — G894 Chronic pain syndrome: Secondary | ICD-10-CM | POA: Diagnosis not present

## 2023-10-28 DIAGNOSIS — Z79899 Other long term (current) drug therapy: Secondary | ICD-10-CM | POA: Diagnosis not present

## 2023-10-28 DIAGNOSIS — M47816 Spondylosis without myelopathy or radiculopathy, lumbar region: Secondary | ICD-10-CM | POA: Diagnosis not present

## 2023-11-10 DIAGNOSIS — M1612 Unilateral primary osteoarthritis, left hip: Secondary | ICD-10-CM | POA: Diagnosis not present

## 2023-11-10 DIAGNOSIS — M461 Sacroiliitis, not elsewhere classified: Secondary | ICD-10-CM | POA: Diagnosis not present

## 2023-11-13 ENCOUNTER — Ambulatory Visit: Admitting: Podiatry

## 2023-11-24 DIAGNOSIS — R188 Other ascites: Secondary | ICD-10-CM | POA: Diagnosis not present

## 2023-12-02 DIAGNOSIS — R188 Other ascites: Secondary | ICD-10-CM | POA: Diagnosis not present

## 2023-12-12 ENCOUNTER — Emergency Department (HOSPITAL_BASED_OUTPATIENT_CLINIC_OR_DEPARTMENT_OTHER)

## 2023-12-12 ENCOUNTER — Other Ambulatory Visit: Payer: Self-pay

## 2023-12-12 ENCOUNTER — Emergency Department (HOSPITAL_BASED_OUTPATIENT_CLINIC_OR_DEPARTMENT_OTHER)
Admission: EM | Admit: 2023-12-12 | Discharge: 2023-12-12 | Disposition: A | Discharge status: Home / Self Care | Attending: Emergency Medicine | Admitting: Emergency Medicine

## 2023-12-12 ENCOUNTER — Encounter (HOSPITAL_BASED_OUTPATIENT_CLINIC_OR_DEPARTMENT_OTHER): Payer: Self-pay

## 2023-12-12 DIAGNOSIS — I10 Essential (primary) hypertension: Secondary | ICD-10-CM | POA: Insufficient documentation

## 2023-12-12 DIAGNOSIS — Z87891 Personal history of nicotine dependence: Secondary | ICD-10-CM | POA: Diagnosis not present

## 2023-12-12 DIAGNOSIS — N1330 Unspecified hydronephrosis: Secondary | ICD-10-CM | POA: Diagnosis not present

## 2023-12-12 DIAGNOSIS — Z85048 Personal history of other malignant neoplasm of rectum, rectosigmoid junction, and anus: Secondary | ICD-10-CM | POA: Insufficient documentation

## 2023-12-12 DIAGNOSIS — R590 Localized enlarged lymph nodes: Secondary | ICD-10-CM | POA: Diagnosis not present

## 2023-12-12 DIAGNOSIS — R109 Unspecified abdominal pain: Secondary | ICD-10-CM | POA: Diagnosis not present

## 2023-12-12 DIAGNOSIS — N939 Abnormal uterine and vaginal bleeding, unspecified: Secondary | ICD-10-CM | POA: Insufficient documentation

## 2023-12-12 DIAGNOSIS — Z79899 Other long term (current) drug therapy: Secondary | ICD-10-CM | POA: Insufficient documentation

## 2023-12-12 DIAGNOSIS — R102 Pelvic and perineal pain: Secondary | ICD-10-CM | POA: Diagnosis present

## 2023-12-12 DIAGNOSIS — R1030 Lower abdominal pain, unspecified: Secondary | ICD-10-CM | POA: Diagnosis not present

## 2023-12-12 DIAGNOSIS — G8929 Other chronic pain: Secondary | ICD-10-CM | POA: Diagnosis not present

## 2023-12-12 LAB — COMPREHENSIVE METABOLIC PANEL WITH GFR
ALT: 9 U/L (ref 0–44)
AST: 16 U/L (ref 15–41)
Albumin: 3.9 g/dL (ref 3.5–5.0)
Alkaline Phosphatase: 93 U/L (ref 38–126)
Anion gap: 13 (ref 5–15)
BUN: 13 mg/dL (ref 6–20)
CO2: 24 mmol/L (ref 22–32)
Calcium: 9.9 mg/dL (ref 8.9–10.3)
Chloride: 103 mmol/L (ref 98–111)
Creatinine, Ser: 0.95 mg/dL (ref 0.44–1.00)
GFR, Estimated: >60 mL/min (ref >60)
Glucose, Bld: 101 mg/dL — ABNORMAL HIGH (ref 70–99)
Potassium: 3.6 mmol/L (ref 3.5–5.1)
Sodium: 141 mmol/L (ref 135–145)
Total Bilirubin: 0.3 mg/dL (ref 0.0–1.2)
Total Protein: 7.9 g/dL (ref 6.5–8.1)

## 2023-12-12 LAB — URINALYSIS, ROUTINE W REFLEX MICROSCOPIC
Bilirubin Urine: NEGATIVE
Glucose, UA: NEGATIVE mg/dL
Ketones, ur: NEGATIVE mg/dL
Nitrite: NEGATIVE
Protein, ur: 100 mg/dL — AB
RBC / HPF: >50 RBC/hpf (ref 0–5)
Specific Gravity, Urine: >1.046 — ABNORMAL HIGH (ref 1.005–1.030)
Trans Epithel, UA: <1
WBC, UA: >50 WBC/hpf (ref 0–5)
pH: 6 (ref 5.0–8.0)

## 2023-12-12 LAB — CBC
HCT: 38.5 % (ref 36.0–46.0)
Hemoglobin: 11.9 g/dL — ABNORMAL LOW (ref 12.0–15.0)
MCH: 24.6 pg — ABNORMAL LOW (ref 26.0–34.0)
MCHC: 30.9 g/dL (ref 30.0–36.0)
MCV: 79.7 fL — ABNORMAL LOW (ref 80.0–100.0)
Platelets: 289 10*3/uL (ref 150–400)
RBC: 4.83 MIL/uL (ref 3.87–5.11)
RDW: 16.2 % — ABNORMAL HIGH (ref 11.5–15.5)
WBC: 8.7 10*3/uL (ref 4.0–10.5)
nRBC: 0 % (ref 0.0–0.2)

## 2023-12-12 MED ORDER — ONDANSETRON HCL 4 MG/2ML IJ SOLN
4.0000 mg | Freq: Once | INTRAMUSCULAR | Status: AC
  Administered 2023-12-12: 4 mg via INTRAVENOUS
  Filled 2023-12-12: qty 2

## 2023-12-12 MED ORDER — CEFADROXIL 500 MG PO CAPS: 500.0000 mg | ORAL_CAPSULE | Freq: Two times a day (BID) | ORAL | 0 refills | Status: DC

## 2023-12-12 MED ORDER — HYDROMORPHONE HCL 1 MG/ML IJ SOLN
2.0000 mg | Freq: Once | INTRAMUSCULAR | Status: AC
  Administered 2023-12-12: 2 mg via INTRAVENOUS
  Filled 2023-12-12: qty 2

## 2023-12-12 MED ORDER — IOHEXOL 300 MG/ML  SOLN
100.0000 mL | Freq: Once | INTRAMUSCULAR | Status: AC | PRN
  Administered 2023-12-12: 100 mL via INTRAVENOUS

## 2023-12-12 MED ORDER — SODIUM CHLORIDE 0.9 % IV BOLUS
500.0000 mL | Freq: Once | INTRAVENOUS | Status: AC
  Administered 2023-12-12: 500 mL via INTRAVENOUS

## 2023-12-12 NOTE — Discharge Instructions (Signed)
 As discussed, I have talked to both [facility] who recommended that the planned stent replacement on 17 June is appropriate.  Your urine showed possible infection so we will put you on antibiotics for this and culture your urine.  Continue take your pain medicine as already prescribed.  Please do not hesitate to return to emergency department if the worrisome signs and symptoms we discussed become apparent.

## 2023-12-12 NOTE — ED Triage Notes (Signed)
 Patient arrives POV with complaints of vaginal bleeding and increased pelvic pain. Patient is seeing a doctor about her vaginal bleeding, but she is becoming increasingly dizzy as well.   Hx of rectal cancer and she has an ostomy.

## 2023-12-12 NOTE — ED Provider Notes (Signed)
 [place] EMERGENCY DEPARTMENT AT [facility] Provider Note   CSN: [id number] Arrival date & time: 12/12/23  1206     History  Chief Complaint  Patient presents with   Vaginal Bleeding   Pelvic Pain    Lindsay Little is a 58 y.o. female.   Vaginal Bleeding Pelvic Pain   58 year old female presents emergency department with complaints of vaginal bleeding, pelvic pain.  States that she has been having vaginal bleeding for the past 40+ days.  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  States that she has followed with [facility] for these findings and has planned procedure next month.  Their belief is that the persistent vaginal bleeding is most likely coming from the peritoneal vaginal fistula given that patient has had a hysterectomy in the past.  Patient states that over the past week or so, pain has increased.  States that is her type of pain but with worsening pain.  Is on hydromorphone  every 4 hours as needed but typically only takes it at night.  Patient has not been getting significant relief despite taking her pain medication.  Reports feelings of nausea but no emesis.  Denies any fevers, chills, urinary symptoms, change in bowel habits from ostomy site.  Presents emergency department for further assessment/evaluation.  Patient also has bilateral ureteral stents placed and is due for exchange per patient; she is also wants to make sure there are no issues with her stents.  Past medical history significant for hypertension, hypercholesterolemia, rectal adenocarcinoma with, chemotherapy-induced neuropathy, GERD, chronic gastritis, IBS, benign pelvic mass, Graves' disease, SBO, lumbar spinal fusion, discitis, chronic pain syndrome, and known peritoneal fistula, peritoneal vaginal fistula  Home Medications Prior to Admission medications   Medication Sig Start Date  End Date Taking? Authorizing Provider  cefadroxil  (DURICEF) 500 MG capsule Take 1 capsule (500 mg total) by mouth 2 (two) times daily. 12/12/23  Yes Neil Balls A, PA  bisoprolol  (ZEBETA ) 5 MG tablet Take 5 mg by mouth daily.    [provider]  Coenzyme Q10 (COQ10) 200 MG CAPS Take 200 mg by mouth at bedtime.    [provider]  DULoxetine  (CYMBALTA ) 60 MG capsule Take 120 mg by mouth at bedtime.    [provider]  esomeprazole  (NEXIUM ) 40 MG capsule Take 1 capsule (40 mg total) by mouth 2 (two) times daily before a meal. Patient taking differently: Take 40 mg by mouth See admin instructions. Take 40 mg by mouth in the morning before breakfast and an additional 40 mg once a day as needed for unresolved reflux 12/22/15   Candyce Champagne, MD  famotidine  (PEPCID ) 20 MG tablet Take 20 mg by mouth 2 (two) times daily as needed for heartburn or indigestion.    [provider]  furosemide  (LASIX ) 20 MG tablet Take 20 mg by mouth daily as needed for edema.     [provider]  HYDROmorphone  (DILAUDID ) 4 MG tablet Take one tablet (4 mg dose) by mouth every 6 (six) hours as needed for Pain for up to 30 days. Max Daily Amount: 16 mg Patient taking differently: Take 4 mg by mouth every 6 (six) hours. 05/13/23     hyoscyamine  (LEVSIN) 0.125 MG tablet Take 0.125 mg by mouth 3 (three) times daily as needed for cramping. 01/28/23   [provider]  Ibuprofen 200 MG CAPS Take 400 mg by mouth every 6 (six)  hours as needed (for pain).    [provider]  levETIRAcetam  (KEPPRA ) 250 MG tablet Take 500 mg by mouth in the morning and at bedtime.    [provider]  levothyroxine  (SYNTHROID ) 50 MCG tablet Take 50 mcg by mouth daily before breakfast. 02/07/22   [provider]  Melatonin 3 MG TABS Take 3-6 mg by mouth at bedtime as needed (sleep).    [provider]  naloxone  (NARCAN ) nasal spray 4 mg/0.1 mL Place 0.4 mg into the nose  once. 12/18/22   [provider]  oxybutynin  (DITROPAN  XL) 15 MG 24 hr tablet Take 15 mg by mouth at bedtime.    [provider]  polyethylene glycol (MIRALAX  / GLYCOLAX ) 17 g packet Take 17 g by mouth See admin instructions. Mix 17 grams into the recommended amount of a beverage and drink once a day- may take an additional 17 grams once a day as needed for constipation    [provider]  pregabalin  (LYRICA ) 225 MG capsule Take 225 mg by mouth 2 (two) times daily. 02/07/22   [provider]  promethazine  (PHENERGAN ) 25 MG tablet Take 1 tablet (25 mg total) by mouth every 6 (six) hours as needed for nausea or vomiting. 06/08/23   Curatolo, Adam, DO  rosuvastatin  (CRESTOR ) 5 MG tablet Take 5 mg by mouth every evening.    [provider]  tamsulosin  (FLOMAX ) 0.4 MG CAPS capsule Take 0.4 mg by mouth daily.    [provider]  triamcinolone  lotion (KENALOG ) 0.1 % Apply 1 application  topically 2 (two) times daily as needed (for itching- scalp).    [provider]  TYLENOL  500 MG tablet Take 1,000 mg by mouth every 6 (six) hours as needed for mild pain (pain score 1-3) or headache.    [provider]      Allergies    Lidocaine , Bupropion, Metronidazole , Oxycodone , Penicillins, Sulfa antibiotics, Pantoprazole , Adhesive [tape], Doxycycline , Iron , Metoprolol , and Wound dressing adhesive    Review of Systems   Review of Systems  Genitourinary:  Positive for pelvic pain and vaginal bleeding.  All other systems reviewed and are negative.   Physical Exam Updated Vital Signs BP (!) 114/90   Pulse 98   Temp 98.9 F (37.2 C) (Oral)   Resp 20   Ht 5' 10.5" (1.791 m)   Wt 131.5 kg   SpO2 98%   BMI 41.02 kg/m  Physical Exam Vitals and nursing note reviewed.  Constitutional:      General: She is not in acute distress.    Appearance: She is well-developed.  HENT:     Head: Normocephalic and atraumatic.  Eyes:      Conjunctiva/sclera: Conjunctivae normal.  Cardiovascular:     Rate and Rhythm: Normal rate and regular rhythm.     Heart sounds: No murmur heard. Pulmonary:     Effort: Pulmonary effort is normal. No respiratory distress.     Breath sounds: Normal breath sounds.  Abdominal:     Palpations: Abdomen is soft.     Tenderness: There is abdominal tenderness.     Comments: Tenderness to be pubic region.  Ostomy site left lower abdomen without erythema, purulent drainage.  Soft brown-colored stool appreciated in ostomy bag.  Musculoskeletal:        General: No swelling.     Cervical back: Neck supple.  Skin:    General: Skin is warm and dry.     Capillary Refill: Capillary refill takes less than  2 seconds.  Neurological:     Mental Status: She is alert.  Psychiatric:        Mood and Affect: Mood normal.     ED Results / Procedures / Treatments   Labs (all labs ordered are listed, but only abnormal results are displayed) Labs Reviewed  CBC - Abnormal; Notable for the following components:      Result Value   Hemoglobin 11.9 (*)    MCV 79.7 (*)    MCH 24.6 (*)    RDW 16.2 (*)    All other components within normal limits  COMPREHENSIVE METABOLIC PANEL WITH GFR - Abnormal; Notable for the following components:   Glucose, Bld 101 (*)    All other components within normal limits  URINALYSIS, ROUTINE W REFLEX MICROSCOPIC - Abnormal; Notable for the following components:   APPearance HAZY (*)    Specific Gravity, Urine >1.046 (*)    Hgb urine dipstick MODERATE (*)    Protein, ur 100 (*)    Leukocytes,Ua LARGE (*)    Bacteria, UA RARE (*)    All other components within normal limits  URINE CULTURE    EKG None  Radiology CT ABDOMEN PELVIS W CONTRAST Result Date: 12/12/2023 CLINICAL DATA:  Acute abdominal pain. Vaginal bleeding and pelvic pain. History of hysterectomy. EXAM: CT ABDOMEN AND PELVIS WITH CONTRAST TECHNIQUE: Multidetector CT imaging of the abdomen and pelvis was  performed using the standard protocol following bolus administration of intravenous contrast. RADIATION DOSE REDUCTION: This exam was performed according to the departmental dose-optimization program which includes automated exposure control, adjustment of the mA and/or kV according to patient size and/or use of iterative reconstruction technique. CONTRAST:  OMNIPAQUE  IOHEXOL  300 MG/ML  SOLN COMPARISON:  CT abdomen and pelvis 08/12/2023 FINDINGS: Lower chest: No acute abnormality. Hepatobiliary: There is a subcentimeter hypodensity in the right lobe of the liver measuring 8 mm image 2/25. This is not definitely seen on prior. The gallbladder and bile ducts are within normal limits. Pancreas: Unremarkable. No pancreatic ductal dilatation or surrounding inflammatory changes. Spleen: Normal in size without focal abnormality. Adrenals/Urinary Tract: Bilateral ureteral stents are in appropriate position. There is mild wall thickening at the bladder, unchanged. There is new moderate right and mild left hydronephrosis with bilateral Peri ureteral stranding. Otherwise, the kidneys and adrenal glands are within normal limits. Stomach/Bowel: Left-sided colostomy is again seen. There is no evidence for bowel obstruction, pneumatosis or free air. The appendix is not visualized. Small bowel anastomosis is seen in the lower quadrant. Stomach is decompressed. Vascular/Lymphatic: Aorta and IVC are normal in size. There are atherosclerotic calcifications of the aorta. There are prominent lower retroperitoneal and iliac chain lymph nodes bilaterally similar to the prior study. There is enlarged right quadrant mesenteric lymph node measuring 12 mm short axis, unchanged from the prior examination. Reproductive: Patient is status post hysterectomy. There is a small amount of fluid in the vagina. Presacral fluid collection containing a small amount of air is again seen. This is thick walled. This collection measures approximately  3.0 x 5.9 3.0 cm and appears unchanged. This collection abuts the posterior margin of the vagina. The adnexa appear unchanged. Complex low-attenuation area in the left adnexa measuring 5.8 by 3.1 cm is grossly unchanged. Other: There is no ascites or focal abdominal wall hernia. Musculoskeletal: Lower lumbar fusion hardware present. Degenerative changes affect the hips. IMPRESSION: 1. New moderate right and mild left hydronephrosis with bilateral ureteral stents in appropriate position. 2. Stable bladder wall thickening.  Correlate clinically for cystitis. 3. Stable presacral fluid collection containing a small amount of air. This collection abuts the posterior margin of the vagina. Fistula not excluded. 4. Stable indeterminate complex low-attenuation area in the left adnexa. 5. Stable prominent lower retroperitoneal and iliac chain lymph nodes. 6. Stable enlarged right quadrant mesenteric lymph node. 7. Aortic atherosclerosis. Aortic Atherosclerosis (ICD10-I70.0). Electronically Signed   By: Tyron Gallon M.D.   On: 12/12/2023 17:18    Procedures Procedures    Medications Ordered in ED Medications  HYDROmorphone  (DILAUDID ) injection 2 mg (2 mg Intravenous Given 12/12/23 1314)  sodium chloride  0.9 % bolus 500 mL (0 mLs Intravenous Stopped 12/12/23 1447)  ondansetron  (ZOFRAN ) injection 4 mg (4 mg Intravenous Given 12/12/23 1314)  iohexol  (OMNIPAQUE ) 300 MG/ML solution 100 mL (100 mLs Intravenous Contrast Given 12/12/23 1415)  HYDROmorphone  (DILAUDID ) injection 2 mg (2 mg Intravenous Given 12/12/23 1526)  HYDROmorphone  (DILAUDID ) injection 2 mg (2 mg Intravenous Given 12/12/23 1817)    ED Course/ Medical Decision Making/ A&P Clinical Course as of 12/13/23 0945  Fri Dec 12, 2023  1723 Imaging Results - CT Abdomen Pelvis W Contrast (12/02/2023 12:05 PM EDT) Procedure Note Dino Frank, MD - 12/02/2023  Formatting of this note might be different from the original. CT ABDOMEN PELVIS W CONTRAST,  12/02/2023 12:05 PM   IMPRESSION:  1. Overall similar size of a presacral gas and fluid containing collection posterior to the vaginal cuff which tracks superiorly along the presacral space and has a fistulous connection with the cecum/appendix at the level of S2. The reported peritoneal vaginal fistula is not discretely resolved by CT on today's exam. 2. Moderate bilateral hydronephrosis is increased from prior. Bilateral nephroureteral stents in place with similar bilateral periureteral stranding and urothelial thickening. 3. Similar lobulated cystic structure along the left pelvic sidewall, possibly lymphocele or seroma, or adnexal cyst.   [CR]  1820 Consulted urology Dr. Aden Agreste with [facility] who did not recommend emergent stent replacement at this time given appearance of urine and without evidence of AKI currently.  Will defer to outpatient [facility]. [CR]  1832 Consulted to [facility] regarding the patient recommended empirically placing patient on antibiotics, culturing urine and plan for follow-up on the 17th for stent replacement. [CR]    Clinical Course User Index [CR] Narka Butter, PA                                 Medical Decision Making Amount and/or Complexity of Data Reviewed Labs: ordered. Radiology: ordered.  Risk Prescription drug management.   This patient presents to the ED for concern of abdominal pain, this involves an extensive number of treatment options, and is a complaint that carries with it a high risk of complications and morbidity.  The differential diagnosis includes PUD, cholecystitis, CBD pathology, gastritis, SBO/LBO, volvulus, diverticulitis, appendicitis, ovarian cyst, tubo-ovarian abscess, fistulization, other   Co morbidities that complicate the patient evaluation  See HPI   Additional history obtained:  Additional history obtained from EMR External records from outside source obtained and reviewed including hospital  records   Lab Tests:  I Ordered, and personally interpreted labs.  The pertinent results include: No leukocytosis.  Anemia with hemoglobin 11.9.  Platelets within normal range.  No electrolyte abnormalities.  No transaminitis.  No renal dysfunction.  UA WBC clumps present, mucus present, bacteria present, greater than 50 RBCs and WBCs present, leukocytes present.  UA also with  moderate proteins, moderate hemoglobin present.   Imaging Studies ordered:  I ordered imaging studies including CT abdomen pelvis I independently visualized and interpreted imaging which showed moderate right and mild left hydronephrosis with bilateral ureteral stents.  Stable bladder wall thickening.  Stable presacral fluid collection containing small amount of air.  Collection abuts posterior margin of vagina.  Stable intermediate complex low-attenuation near left adnexa.  Stable prominent lower retroperitoneal and iliac chain lymph nodes.  Stable enlarged right upper quadrant mesenteric lymph node.  Aortic atherosclerosis. I agree with the radiologist interpretation  Cardiac Monitoring: / EKG:  The patient was maintained on a cardiac monitor.  I personally viewed and interpreted the cardiac monitored which showed an underlying rhythm of: Sinus rhythm borderline T wave changes in anterior leads.   Consultations Obtained:  See ED course  Problem List / ED Course / Critical interventions / Medication management  Pelvic pain, vaginal bleeding I ordered medication including Zofran , hydromorphone , normal saline   Reevaluation of the patient after these medicines showed that the patient improved I have reviewed the patients home medicines and have made adjustments as needed   Social Determinants of Health:  Former cigarette use.  Denies illicit drug use.   Test / Admission - Considered:  Chronic pelvic pain, vaginal spotting Vitals signs within normal range and stable throughout visit. Laboratory/imaging  studies significant for: See above 58 year old female presents emergency department with complaints of vaginal bleeding, pelvic pain.  States that she has been having vaginal bleeding for the past 40+ days.  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  States that she has followed with [facility] IR for these findings and has planned procedure next month.  Their belief is that the persistent vaginal bleeding is most likely coming from the peritoneal vaginal fistula given that patient has had a hysterectomy in the past.  Patient states that over the past week or so, pain has increased.  States that is her type of pain but with worsening pain.  Is on hydromorphone  every 4 hours as needed but typically only takes it at night.  Patient has not been getting significant relief despite taking her pain medication.  Reports feelings of nausea but no emesis.  Denies any fevers, chills, urinary symptoms, change in bowel habits from ostomy site.  Presents emergency department for further assessment/evaluation.  Patient also has bilateral ureteral stents placed and is due for exchange per patient; she is also wants to make sure there are no issues with her stents On exam, patient with mainly lower abdominal tenderness with most tenderness to suprapubic/midline pelvic region.  Ostomy site intact without evidence of secondary infection present.  Labs with UA leukocyte, RBC, WBC, bacteria WBC clumps present; has had UA similar in the past with negative cultures.  CT imaging showed slightly worsening hydronephrosis right greater than left compared to CT scan within the [facility] system compared to January the patient had a CT scan 10 days ago through [facility] with similar read by radiology other imaging studies not able to be directly visualized.  CT imaging otherwise with chronic changes consistent with fistula as peritoneal  vaginal fistula as well as presacral fistulization to cecum/appendix without any acute change as well as additional chronic findings as above.  Given slightly more apparent hydronephrosis on CT scan with patient with some described flank pain (although with flank pain at baseline), urology was consulted both [facility] as  well as [facility].  Recommendation was that since the patient has normal renal function and CT scan appeared stable from CT scan 10 days ago, appropriate for patient to follow-up with planned ureteral stent exchange on 17 June.  [facility] recommended culturing urine empirically treating with antibiotics.  Suspect the majority patient's symptoms are likely secondary to acute on chronic pelvic pain as she has been dealing with this for at least a few years now.  Patient symptoms responded well to medications given while in emergency department.  Will recommend follow-up with general surgery as well as urology in the outpatient setting as she already has scheduled appointments.  Treatment plan discussed with patient and she acknowledged understanding was agreeable to said plan.  Patient overall well-appearing, afebrile in no acute distress. Treatment plan were discussed at length with patient and they knowledge understanding was agreeable to said plan.  Appropriate consultations were made as described in the ED course.  Patient was stable upon admission to the hospital.         Final Clinical Impression(s) / ED Diagnoses Final diagnoses:  Chronic pelvic pain in female  Vaginal bleeding  Hydronephrosis, unspecified hydronephrosis type    Rx / DC Orders ED Discharge Orders          Ordered    cefadroxil  (DURICEF) 500 MG capsule  2 times daily        12/12/23 1833              Holliday Butter, [place] 12/13/23 0945    Lind Repine, MD 12/15/23 1524

## 2023-12-13 LAB — URINE CULTURE

## 2023-12-16 DIAGNOSIS — R188 Other ascites: Secondary | ICD-10-CM | POA: Diagnosis not present

## 2023-12-16 DIAGNOSIS — C20 Malignant neoplasm of rectum: Secondary | ICD-10-CM | POA: Diagnosis not present

## 2023-12-30 DIAGNOSIS — Z85048 Personal history of other malignant neoplasm of rectum, rectosigmoid junction, and anus: Secondary | ICD-10-CM | POA: Diagnosis not present

## 2023-12-30 DIAGNOSIS — E785 Hyperlipidemia, unspecified: Secondary | ICD-10-CM | POA: Diagnosis not present

## 2023-12-30 DIAGNOSIS — N131 Hydronephrosis with ureteral stricture, not elsewhere classified: Secondary | ICD-10-CM | POA: Diagnosis not present

## 2023-12-30 DIAGNOSIS — K219 Gastro-esophageal reflux disease without esophagitis: Secondary | ICD-10-CM | POA: Diagnosis not present

## 2023-12-30 DIAGNOSIS — I10 Essential (primary) hypertension: Secondary | ICD-10-CM | POA: Diagnosis not present

## 2023-12-30 DIAGNOSIS — Z923 Personal history of irradiation: Secondary | ICD-10-CM | POA: Diagnosis not present

## 2023-12-30 DIAGNOSIS — Z933 Colostomy status: Secondary | ICD-10-CM | POA: Diagnosis not present

## 2023-12-30 DIAGNOSIS — N1330 Unspecified hydronephrosis: Secondary | ICD-10-CM | POA: Diagnosis not present

## 2023-12-30 DIAGNOSIS — Z79899 Other long term (current) drug therapy: Secondary | ICD-10-CM | POA: Diagnosis not present

## 2023-12-30 DIAGNOSIS — C20 Malignant neoplasm of rectum: Secondary | ICD-10-CM | POA: Diagnosis not present

## 2024-01-07 DIAGNOSIS — R000 Tachycardia, unspecified: Secondary | ICD-10-CM | POA: Diagnosis not present

## 2024-01-07 DIAGNOSIS — R55 Syncope and collapse: Secondary | ICD-10-CM | POA: Diagnosis not present

## 2024-01-07 DIAGNOSIS — E785 Hyperlipidemia, unspecified: Secondary | ICD-10-CM | POA: Diagnosis not present

## 2024-01-07 DIAGNOSIS — E039 Hypothyroidism, unspecified: Secondary | ICD-10-CM | POA: Diagnosis not present

## 2024-01-07 DIAGNOSIS — R079 Chest pain, unspecified: Secondary | ICD-10-CM | POA: Diagnosis not present

## 2024-01-07 DIAGNOSIS — M1712 Unilateral primary osteoarthritis, left knee: Secondary | ICD-10-CM | POA: Diagnosis not present

## 2024-01-07 DIAGNOSIS — N179 Acute kidney failure, unspecified: Secondary | ICD-10-CM | POA: Diagnosis not present

## 2024-01-07 DIAGNOSIS — R0789 Other chest pain: Secondary | ICD-10-CM | POA: Diagnosis not present

## 2024-01-07 DIAGNOSIS — M25562 Pain in left knee: Secondary | ICD-10-CM | POA: Diagnosis not present

## 2024-01-07 DIAGNOSIS — Z79899 Other long term (current) drug therapy: Secondary | ICD-10-CM | POA: Diagnosis not present

## 2024-01-07 DIAGNOSIS — Z9181 History of falling: Secondary | ICD-10-CM | POA: Diagnosis not present

## 2024-01-07 DIAGNOSIS — I10 Essential (primary) hypertension: Secondary | ICD-10-CM | POA: Diagnosis not present

## 2024-01-08 ENCOUNTER — Ambulatory Visit (INDEPENDENT_AMBULATORY_CARE_PROVIDER_SITE_OTHER): Admitting: Podiatry

## 2024-01-08 DIAGNOSIS — M25562 Pain in left knee: Secondary | ICD-10-CM | POA: Diagnosis not present

## 2024-01-08 DIAGNOSIS — N179 Acute kidney failure, unspecified: Secondary | ICD-10-CM | POA: Diagnosis not present

## 2024-01-08 DIAGNOSIS — Z91199 Patient's noncompliance with other medical treatment and regimen due to unspecified reason: Secondary | ICD-10-CM

## 2024-01-08 DIAGNOSIS — R079 Chest pain, unspecified: Secondary | ICD-10-CM | POA: Diagnosis not present

## 2024-01-08 DIAGNOSIS — R55 Syncope and collapse: Secondary | ICD-10-CM | POA: Diagnosis not present

## 2024-01-08 NOTE — Progress Notes (Signed)
 Had fall and in hosptial

## 2024-01-09 DIAGNOSIS — R079 Chest pain, unspecified: Secondary | ICD-10-CM | POA: Diagnosis not present

## 2024-01-09 DIAGNOSIS — R55 Syncope and collapse: Secondary | ICD-10-CM | POA: Diagnosis not present

## 2024-01-10 DIAGNOSIS — R55 Syncope and collapse: Secondary | ICD-10-CM | POA: Diagnosis not present

## 2024-01-11 DIAGNOSIS — R55 Syncope and collapse: Secondary | ICD-10-CM | POA: Diagnosis not present

## 2024-02-26 DIAGNOSIS — M533 Sacrococcygeal disorders, not elsewhere classified: Secondary | ICD-10-CM | POA: Diagnosis not present

## 2024-03-10 DIAGNOSIS — Z87891 Personal history of nicotine dependence: Secondary | ICD-10-CM | POA: Diagnosis not present

## 2024-03-10 DIAGNOSIS — G894 Chronic pain syndrome: Secondary | ICD-10-CM | POA: Diagnosis not present

## 2024-03-10 DIAGNOSIS — C20 Malignant neoplasm of rectum: Secondary | ICD-10-CM | POA: Diagnosis not present

## 2024-03-10 DIAGNOSIS — M1712 Unilateral primary osteoarthritis, left knee: Secondary | ICD-10-CM | POA: Diagnosis not present

## 2024-03-10 DIAGNOSIS — Z049 Encounter for examination and observation for unspecified reason: Secondary | ICD-10-CM | POA: Diagnosis not present

## 2024-03-10 DIAGNOSIS — Z981 Arthrodesis status: Secondary | ICD-10-CM | POA: Diagnosis not present

## 2024-03-10 DIAGNOSIS — E785 Hyperlipidemia, unspecified: Secondary | ICD-10-CM | POA: Diagnosis not present

## 2024-03-10 DIAGNOSIS — I10 Essential (primary) hypertension: Secondary | ICD-10-CM | POA: Diagnosis not present

## 2024-03-11 DIAGNOSIS — E785 Hyperlipidemia, unspecified: Secondary | ICD-10-CM | POA: Diagnosis not present

## 2024-03-11 DIAGNOSIS — G894 Chronic pain syndrome: Secondary | ICD-10-CM | POA: Diagnosis not present

## 2024-03-11 DIAGNOSIS — Z87891 Personal history of nicotine dependence: Secondary | ICD-10-CM | POA: Diagnosis not present

## 2024-03-11 DIAGNOSIS — C20 Malignant neoplasm of rectum: Secondary | ICD-10-CM | POA: Diagnosis not present

## 2024-03-11 DIAGNOSIS — M1712 Unilateral primary osteoarthritis, left knee: Secondary | ICD-10-CM | POA: Diagnosis not present

## 2024-03-11 DIAGNOSIS — Z981 Arthrodesis status: Secondary | ICD-10-CM | POA: Diagnosis not present

## 2024-03-11 DIAGNOSIS — I10 Essential (primary) hypertension: Secondary | ICD-10-CM | POA: Diagnosis not present

## 2024-03-12 DIAGNOSIS — Z981 Arthrodesis status: Secondary | ICD-10-CM | POA: Diagnosis not present

## 2024-03-12 DIAGNOSIS — E785 Hyperlipidemia, unspecified: Secondary | ICD-10-CM | POA: Diagnosis not present

## 2024-03-12 DIAGNOSIS — C20 Malignant neoplasm of rectum: Secondary | ICD-10-CM | POA: Diagnosis not present

## 2024-03-12 DIAGNOSIS — M1712 Unilateral primary osteoarthritis, left knee: Secondary | ICD-10-CM | POA: Diagnosis not present

## 2024-03-12 DIAGNOSIS — G894 Chronic pain syndrome: Secondary | ICD-10-CM | POA: Diagnosis not present

## 2024-03-12 DIAGNOSIS — I10 Essential (primary) hypertension: Secondary | ICD-10-CM | POA: Diagnosis not present

## 2024-03-12 DIAGNOSIS — Z87891 Personal history of nicotine dependence: Secondary | ICD-10-CM | POA: Diagnosis not present

## 2024-03-21 ENCOUNTER — Encounter (HOSPITAL_COMMUNITY): Payer: Self-pay | Admitting: Emergency Medicine

## 2024-03-21 ENCOUNTER — Emergency Department (HOSPITAL_COMMUNITY)
Admission: EM | Admit: 2024-03-21 | Discharge: 2024-03-22 | Attending: Emergency Medicine | Admitting: Emergency Medicine

## 2024-03-21 ENCOUNTER — Emergency Department (HOSPITAL_COMMUNITY)

## 2024-03-21 ENCOUNTER — Other Ambulatory Visit: Payer: Self-pay

## 2024-03-21 DIAGNOSIS — Z933 Colostomy status: Secondary | ICD-10-CM | POA: Insufficient documentation

## 2024-03-21 DIAGNOSIS — N739 Female pelvic inflammatory disease, unspecified: Secondary | ICD-10-CM | POA: Diagnosis not present

## 2024-03-21 DIAGNOSIS — N1330 Unspecified hydronephrosis: Secondary | ICD-10-CM | POA: Diagnosis not present

## 2024-03-21 DIAGNOSIS — F32A Depression, unspecified: Secondary | ICD-10-CM | POA: Insufficient documentation

## 2024-03-21 DIAGNOSIS — F17210 Nicotine dependence, cigarettes, uncomplicated: Secondary | ICD-10-CM | POA: Insufficient documentation

## 2024-03-21 DIAGNOSIS — R8281 Pyuria: Secondary | ICD-10-CM | POA: Insufficient documentation

## 2024-03-21 DIAGNOSIS — I10 Essential (primary) hypertension: Secondary | ICD-10-CM | POA: Insufficient documentation

## 2024-03-21 DIAGNOSIS — C20 Malignant neoplasm of rectum: Secondary | ICD-10-CM | POA: Diagnosis not present

## 2024-03-21 DIAGNOSIS — Z743 Need for continuous supervision: Secondary | ICD-10-CM | POA: Diagnosis not present

## 2024-03-21 DIAGNOSIS — R1084 Generalized abdominal pain: Secondary | ICD-10-CM | POA: Diagnosis not present

## 2024-03-21 DIAGNOSIS — R1030 Lower abdominal pain, unspecified: Secondary | ICD-10-CM | POA: Insufficient documentation

## 2024-03-21 DIAGNOSIS — K760 Fatty (change of) liver, not elsewhere classified: Secondary | ICD-10-CM | POA: Insufficient documentation

## 2024-03-21 DIAGNOSIS — N939 Abnormal uterine and vaginal bleeding, unspecified: Secondary | ICD-10-CM | POA: Insufficient documentation

## 2024-03-21 DIAGNOSIS — Z79899 Other long term (current) drug therapy: Secondary | ICD-10-CM | POA: Insufficient documentation

## 2024-03-21 DIAGNOSIS — Z85048 Personal history of other malignant neoplasm of rectum, rectosigmoid junction, and anus: Secondary | ICD-10-CM | POA: Diagnosis not present

## 2024-03-21 DIAGNOSIS — R5383 Other fatigue: Secondary | ICD-10-CM | POA: Diagnosis not present

## 2024-03-21 DIAGNOSIS — E039 Hypothyroidism, unspecified: Secondary | ICD-10-CM | POA: Insufficient documentation

## 2024-03-21 DIAGNOSIS — E876 Hypokalemia: Secondary | ICD-10-CM | POA: Diagnosis not present

## 2024-03-21 DIAGNOSIS — R300 Dysuria: Secondary | ICD-10-CM | POA: Diagnosis not present

## 2024-03-21 DIAGNOSIS — N179 Acute kidney failure, unspecified: Secondary | ICD-10-CM

## 2024-03-21 DIAGNOSIS — Y732 Prosthetic and other implants, materials and accessory gastroenterology and urology devices associated with adverse incidents: Secondary | ICD-10-CM | POA: Diagnosis not present

## 2024-03-21 DIAGNOSIS — K5090 Crohn's disease, unspecified, without complications: Secondary | ICD-10-CM | POA: Diagnosis not present

## 2024-03-21 DIAGNOSIS — N938 Other specified abnormal uterine and vaginal bleeding: Secondary | ICD-10-CM | POA: Diagnosis present

## 2024-03-21 DIAGNOSIS — T8131XA Disruption of external operation (surgical) wound, not elsewhere classified, initial encounter: Secondary | ICD-10-CM | POA: Diagnosis not present

## 2024-03-21 LAB — COMPREHENSIVE METABOLIC PANEL WITH GFR
ALT: 6 U/L (ref 0–44)
AST: 12 U/L — ABNORMAL LOW (ref 15–41)
Albumin: 3.5 g/dL (ref 3.5–5.0)
Alkaline Phosphatase: 107 U/L (ref 38–126)
Anion gap: 16 — ABNORMAL HIGH (ref 5–15)
BUN: 19 mg/dL (ref 6–20)
CO2: 25 mmol/L (ref 22–32)
Calcium: 10.3 mg/dL (ref 8.9–10.3)
Chloride: 101 mmol/L (ref 98–111)
Creatinine, Ser: 1.48 mg/dL — ABNORMAL HIGH (ref 0.44–1.00)
GFR, Estimated: 41 mL/min — ABNORMAL LOW (ref >60)
Glucose, Bld: 136 mg/dL — ABNORMAL HIGH (ref 70–99)
Potassium: 3.5 mmol/L (ref 3.5–5.1)
Sodium: 141 mmol/L (ref 135–145)
Total Bilirubin: 1.1 mg/dL (ref 0.0–1.2)
Total Protein: 8.7 g/dL — ABNORMAL HIGH (ref 6.5–8.1)

## 2024-03-21 LAB — URINALYSIS, W/ REFLEX TO CULTURE (INFECTION SUSPECTED)
Bilirubin Urine: NEGATIVE
Glucose, UA: NEGATIVE mg/dL
Ketones, ur: NEGATIVE mg/dL
Nitrite: POSITIVE — AB
Protein, ur: 100 mg/dL — AB
RBC / HPF: >50 RBC/hpf (ref 0–5)
Specific Gravity, Urine: 1.011 (ref 1.005–1.030)
WBC, UA: >50 WBC/hpf (ref 0–5)
pH: 8 (ref 5.0–8.0)

## 2024-03-21 LAB — CBC WITH DIFFERENTIAL/PLATELET
Abs Immature Granulocytes: 0.35 K/uL — ABNORMAL HIGH (ref 0.00–0.07)
Basophils Absolute: 0.1 K/uL (ref 0.0–0.1)
Basophils Relative: 1 %
Eosinophils Absolute: 0.1 K/uL (ref 0.0–0.5)
Eosinophils Relative: 0 %
HCT: 37.9 % (ref 36.0–46.0)
Hemoglobin: 11.3 g/dL — ABNORMAL LOW (ref 12.0–15.0)
Immature Granulocytes: 3 %
Lymphocytes Relative: 13 %
Lymphs Abs: 1.5 K/uL (ref 0.7–4.0)
MCH: 23.3 pg — ABNORMAL LOW (ref 26.0–34.0)
MCHC: 29.8 g/dL — ABNORMAL LOW (ref 30.0–36.0)
MCV: 78.3 fL — ABNORMAL LOW (ref 80.0–100.0)
Monocytes Absolute: 0.8 K/uL (ref 0.1–1.0)
Monocytes Relative: 7 %
Neutro Abs: 9.3 K/uL — ABNORMAL HIGH (ref 1.7–7.7)
Neutrophils Relative %: 76 %
Platelets: 544 K/uL — ABNORMAL HIGH (ref 150–400)
RBC: 4.84 MIL/uL (ref 3.87–5.11)
RDW: 19.6 % — ABNORMAL HIGH (ref 11.5–15.5)
WBC: 12.1 K/uL — ABNORMAL HIGH (ref 4.0–10.5)
nRBC: 0 % (ref 0.0–0.2)

## 2024-03-21 LAB — I-STAT CG4 LACTIC ACID, ED
Lactic Acid, Venous: 3.3 mmol/L (ref 0.5–1.9)
Lactic Acid, Venous: 0.7 mmol/L (ref 0.5–1.9)

## 2024-03-21 LAB — SARS CORONAVIRUS 2 BY RT PCR: SARS Coronavirus 2 by RT PCR: NEGATIVE

## 2024-03-21 LAB — PROTIME-INR
INR: 1.1 (ref 0.8–1.2)
Prothrombin Time: 14.9 s (ref 11.4–15.2)

## 2024-03-21 MED ORDER — PROCHLORPERAZINE EDISYLATE 10 MG/2ML IJ SOLN
5.0000 mg | Freq: Once | INTRAMUSCULAR | Status: AC
  Administered 2024-03-21: 5 mg via INTRAVENOUS
  Filled 2024-03-21: qty 2

## 2024-03-21 MED ORDER — METRONIDAZOLE 500 MG/100ML IV SOLN
500.0000 mg | Freq: Two times a day (BID) | INTRAVENOUS | Status: DC
  Administered 2024-03-21 – 2024-03-22 (×2): 500 mg via INTRAVENOUS
  Filled 2024-03-21 (×2): qty 100

## 2024-03-21 MED ORDER — PREGABALIN 50 MG PO CAPS
225.0000 mg | ORAL_CAPSULE | Freq: Two times a day (BID) | ORAL | Status: DC
  Administered 2024-03-21 – 2024-03-22 (×2): 225 mg via ORAL
  Filled 2024-03-21 (×2): qty 1

## 2024-03-21 MED ORDER — PIPERACILLIN-TAZOBACTAM 3.375 G IVPB 30 MIN: 3.3750 g | Freq: Once | INTRAVENOUS | Status: DC

## 2024-03-21 MED ORDER — HYDROMORPHONE HCL 1 MG/ML IJ SOLN
1.0000 mg | INTRAMUSCULAR | Status: DC | PRN
  Administered 2024-03-21: 1 mg via INTRAVENOUS
  Filled 2024-03-21: qty 1

## 2024-03-21 MED ORDER — PANTOPRAZOLE SODIUM 40 MG PO TBEC
40.0000 mg | DELAYED_RELEASE_TABLET | Freq: Every day | ORAL | Status: DC
  Administered 2024-03-21 – 2024-03-22 (×2): 40 mg via ORAL
  Filled 2024-03-21 (×2): qty 1

## 2024-03-21 MED ORDER — METRONIDAZOLE 500 MG/100ML IV SOLN
500.0000 mg | Freq: Once | INTRAVENOUS | Status: AC
  Administered 2024-03-21: 500 mg via INTRAVENOUS
  Filled 2024-03-21: qty 100

## 2024-03-21 MED ORDER — METRONIDAZOLE 500 MG/100ML IV SOLN: 500.0000 mg | Freq: Once | INTRAVENOUS | Status: DC

## 2024-03-21 MED ORDER — LEVOTHYROXINE SODIUM 50 MCG PO TABS
50.0000 ug | ORAL_TABLET | Freq: Every day | ORAL | Status: DC
  Administered 2024-03-22: 50 ug via ORAL
  Filled 2024-03-21: qty 1

## 2024-03-21 MED ORDER — HYOSCYAMINE SULFATE 0.125 MG PO TBDP: 0.1250 mg | ORAL_TABLET | Freq: Three times a day (TID) | ORAL | Status: DC | PRN

## 2024-03-21 MED ORDER — ROSUVASTATIN CALCIUM 5 MG PO TABS
5.0000 mg | ORAL_TABLET | Freq: Every evening | ORAL | Status: DC
  Administered 2024-03-21 – 2024-03-22 (×2): 5 mg via ORAL
  Filled 2024-03-21 (×3): qty 1

## 2024-03-21 MED ORDER — IOHEXOL 300 MG/ML  SOLN
80.0000 mL | Freq: Once | INTRAMUSCULAR | Status: AC | PRN
  Administered 2024-03-21: 80 mL via INTRAVENOUS

## 2024-03-21 MED ORDER — HYDROMORPHONE HCL 1 MG/ML IJ SOLN
1.0000 mg | Freq: Once | INTRAMUSCULAR | Status: AC
  Administered 2024-03-21: 1 mg via INTRAVENOUS
  Filled 2024-03-21: qty 1

## 2024-03-21 MED ORDER — ONDANSETRON HCL 4 MG/2ML IJ SOLN
4.0000 mg | Freq: Once | INTRAMUSCULAR | Status: AC
  Administered 2024-03-21: 4 mg via INTRAVENOUS
  Filled 2024-03-21: qty 2

## 2024-03-21 MED ORDER — SODIUM CHLORIDE 0.9 % IV SOLN
2.0000 g | Freq: Three times a day (TID) | INTRAVENOUS | Status: DC
  Administered 2024-03-21 – 2024-03-22 (×4): 2 g via INTRAVENOUS
  Filled 2024-03-21 (×4): qty 12.5

## 2024-03-21 MED ORDER — SODIUM CHLORIDE 0.9 % IV SOLN
2.0000 g | Freq: Once | INTRAVENOUS | Status: AC
  Administered 2024-03-21: 2 g via INTRAVENOUS
  Filled 2024-03-21: qty 12.5

## 2024-03-21 MED ORDER — HYDROMORPHONE HCL 2 MG PO TABS: 4.0000 mg | ORAL_TABLET | Freq: Four times a day (QID) | ORAL | Status: DC | PRN

## 2024-03-21 MED ORDER — DULOXETINE HCL 30 MG PO CPEP
120.0000 mg | ORAL_CAPSULE | Freq: Every day | ORAL | Status: DC
  Administered 2024-03-21: 120 mg via ORAL
  Filled 2024-03-21: qty 4

## 2024-03-21 MED ORDER — OXYBUTYNIN CHLORIDE ER 5 MG PO TB24
15.0000 mg | ORAL_TABLET | Freq: Every day | ORAL | Status: DC
  Administered 2024-03-21: 15 mg via ORAL
  Filled 2024-03-21: qty 1
  Filled 2024-03-21 (×2): qty 3

## 2024-03-21 MED ORDER — TAMSULOSIN HCL 0.4 MG PO CAPS
0.4000 mg | ORAL_CAPSULE | Freq: Every day | ORAL | Status: DC
  Administered 2024-03-21 – 2024-03-22 (×2): 0.4 mg via ORAL
  Filled 2024-03-21 (×3): qty 1

## 2024-03-21 MED ORDER — LACTATED RINGERS IV BOLUS
1000.0000 mL | Freq: Once | INTRAVENOUS | Status: AC
  Administered 2024-03-21: 1000 mL via INTRAVENOUS

## 2024-03-21 MED ORDER — LEVETIRACETAM 500 MG PO TABS
500.0000 mg | ORAL_TABLET | Freq: Two times a day (BID) | ORAL | Status: DC
  Administered 2024-03-21 – 2024-03-22 (×2): 500 mg via ORAL
  Filled 2024-03-21 (×2): qty 1

## 2024-03-21 MED ORDER — POLYETHYLENE GLYCOL 3350 17 G PO PACK: 17.0000 g | PACK | Freq: Every day | ORAL | Status: DC | PRN

## 2024-03-21 MED ORDER — BISOPROLOL FUMARATE 5 MG PO TABS
5.0000 mg | ORAL_TABLET | Freq: Every day | ORAL | Status: DC
  Administered 2024-03-21: 5 mg via ORAL
  Filled 2024-03-21 (×2): qty 1

## 2024-03-21 NOTE — ED Triage Notes (Signed)
 Pt bib EMS from home due to post op complication. Pt states she had a procedure performed in IR around 10 days ago and hasn't felt right since. Pt states she is nauseas and vomiting some but denies diarrhea. Pt states she has had trouble eating ever since she was sent home.

## 2024-03-21 NOTE — ED Provider Notes (Signed)
 Hospitalist will consult on patient but will manage in the ED to help facilitate transfer to [facility] when available.  They will manage the patient.  General surgery will also consult.   Freddi Hamilton, MD 03/21/24 [phone]

## 2024-03-21 NOTE — Consult Note (Signed)
 Reason for Consult:vaginal bleeding Referring Physician: Dr. Freddi Barnie LOISE Lindsay [name] is an 58 y.o. female.  HPI: The patient is a 58 year old white female who has a long complicated history that started back in 2017 with a low rectal cancer.  She had a low anterior resection with an ostomy.  It appears as though over the next couple years she had issues with a colovaginal fistula as well as a possible colocutaneous fistula.  Over the last few years she has had issues with presacral inflammation and vaginal bleeding.  She was seeing a doctor at [facility] for this but was not getting any relief.  Her previous surgeries were performed by Dr. Sheldon.  Most recently she has been having abdominal pain and some vaginal bleeding over the last several days.  It finally was bad enough that she came to the emergency department.  A new CT scan still shows unexplained presacral inflammation and thickening  Past Medical History:  Diagnosis Date   Anxiety    Chemotherapy-induced neuropathy (HCC)    toes and fingers numbness and tingling   Colorectal delayed anastomotic leak s/p resection & colostomy 07/12/2016 11/17/2015   Colostomy in place [facility]) 07/12/2016   due to anastomosis breakdown w/ colovaginal fistula   Colovaginal fistula s/p omentopexy repair 07/12/2016 07/10/2016   Complication of anesthesia    Depression    Family history of adverse reaction to anesthesia    mother-- ponv   Family history of breast cancer    Family history of pancreatic cancer    Family history of skin cancer    Gastroenteritis 12/20/2015   GERD (gastroesophageal reflux disease)    Hardware complicating wound infection (HCC) 08/03/2019   Hiatal hernia    History of cancer chemotherapy 02-20-2015 to 03-30-2015   History of cardiac murmur as a child    History of chemotherapy    History of chronic gastritis    History of hypertension    no issues since multiple abdminal sx's and chemo--- no medication since 12/ 2016   History  of TMJ disorder    Hypercholesteremia    Hypertension    IBS (irritable bowel syndrome)    dx age 64   Intermittent abdominal pain    post-op multiple abdominal sx's    Microcytic anemia    Mild sleep apnea    per pt study 2014  very mild osa , no cpap recommended, recommeded wt loss and sleep routine   OA (osteoarthritis)    left knee /  left shoulder   Obesity    Parastomal hernia s/p lap repair w mesh 07/23/2018 07/23/2018   Pelvic abscess s/p drainage & omental pedicle flap 11/17/2015 06/23/2015   PONV (postoperative nausea and vomiting)    severe needs Scopolamine  PATCH    Portacath in place    right chest   Rectal adenocarcinoma (HCC) oncologis-  dr lanny-- after radiation/ chemo (ypT1, N0) --  no recurrence per last note 03/ 2018   dx 01-13-2015-- Stage IIIC (T3, N2, M0) post concurrent radiation and chemotherapy 02-20-2015 to 03-30-2015 /  05-25-2015 s/p  LAR w/ RSO (post-op complicated by late abscess and contained anatomotic leak w/ help percutaneous drainage and antibiotics)    Rectocutaneous fistula 07/10/2016   Rotator cuff tear, left    S/P radiation therapy 02/20/15-03/30/15   colon/rectal   Vitamin D  deficiency    Wears glasses    Wears glasses     Past Surgical History:  Procedure Laterality Date   ABDOMINAL HYSTERECTOMY  1996   uterus and cervix   BACK SURGERY  02/12/2018   lumbar surgery   COLON RESECTION N/A 07/12/2016   Procedure: LAPAROSCOPIC LYSIS OF ADHESIONS, OMENTOPEXY, HAND ASSISTED RESECTION OF  COLON, END TO END ANASTOMOSIS, COLOSTOMY;  Surgeon: Elspeth Schultze, MD;  Location: [facility];  Service: General;  Laterality: N/A;   COMBINED HYSTEROSCOPY DIAGNOSTIC / D&C  x2 1990's   CYSTOSCOPY W/ URETERAL STENT PLACEMENT Bilateral 02/16/2023   Procedure: CYSTOSCOPY WITH RETROGRADE PYELOGRAM BILATERAL STENT EXCHANGE;  Surgeon: Shona Layman BROCKS, MD;  Location: [facility];  Service: Urology;  Laterality: Bilateral;   CYSTOSCOPY WITH STENT PLACEMENT Bilateral 05/28/2023    Procedure: CYSTOSCOPY, BILATERAL RETROGRADE PYELOGRAM, BILATERAL STENT EXCHANGE;  Surgeon: Carolee Sherwood JONETTA DOUGLAS, MD;  Location: [facility];  Service: Urology;  Laterality: Bilateral;   DIAGNOSTIC LAPAROSCOPY  age 38 and age 60   EUS N/A 01/18/2015   Procedure: LOWER ENDOSCOPIC ULTRASOUND (EUS);  Surgeon: Elsie Cree, MD;  Location: [facility];  Service: Endoscopy;  Laterality: N/A;   EXCISION OF SKIN TAG  11/17/2015   Procedure: EXCISION OF SKIN TAG;  Surgeon: Elspeth Schultze, MD;  Location: [facility];  Service: General;;   ILEO LOOP COLOSTOMY CLOSURE N/A 11/17/2015   Procedure: LAPAROSCOPIC DIVERTING LOOP ILEOSTOMY  DRAINAGE OF PELVIC ABSCESS;  Surgeon: Elspeth Schultze, MD;  Location: [facility];  Service: General;  Laterality: N/A;   ILEOSTOMY CLOSURE N/A 05/31/2016   Procedure: TAKEDOWN LOOP ILEOSTOMY;  Surgeon: Elspeth Schultze, MD;  Location: [facility];  Service: General;  Laterality: N/A;   IMPACTION REMOVAL  11/17/2015   Procedure: DISIMPACTION REMOVAL;  Surgeon: Elspeth Schultze, MD;  Location: [facility];  Service: General;;   INSERTION OF MESH N/A 07/23/2018   Procedure: INSERTION OF MESH X2;  Surgeon: Schultze Elspeth, MD;  Location[facility];  Service: General;  Laterality: N/A;   IR LUMBAR DISC ASPIRATION W/IMG GUIDE  07/01/2019   KNEE ARTHROSCOPY Left 1990's   KNEE ARTHROSCOPY W/ MENISCECTOMY Left 09/14/2009   and chondroplasty debridement   LAPAROSCOPIC LYSIS OF ADHESIONS  11/17/2015   Procedure: LAPAROSCOPIC LYSIS OF ADHESIONS;  Surgeon: Elspeth Schultze, MD;  Location: [facility];  Service: General;;   LUMBAR WOUND DEBRIDEMENT N/A 04/24/2018   Procedure: wound exploration, irrigation and debridement;  Surgeon: Joshua Alm RAMAN, MD;  Location: [facility];  Service: Neurosurgery;  Laterality: N/A;  wound exploration, irrigation and debridement   LYSIS OF ADHESION N/A 07/23/2018   Procedure: LYSIS OF ADHESIONS;  Surgeon: Schultze Elspeth, MD;  Location[facility];  Service: General;  Laterality: N/A;   PORT-A-CATH REMOVAL N/A 11/21/2016    Procedure: REMOVAL PORT-A-CATH;  Surgeon: Schultze Elspeth, MD;  Location: [facility];  Service: General;  Laterality: N/A;   PORTACATH PLACEMENT N/A 05/25/2015   Procedure: INSERTION PORT-A-CATH;  Surgeon: Elspeth Schultze, MD;  Location[facility];  Service: General;  Laterality: N/A;-remains inplace Right chest.   ROTATOR CUFF REPAIR Right 2006   TONSILLECTOMY  age 62   VENTRAL HERNIA REPAIR N/A 07/23/2018   Procedure: LAPAROSCOPIC VENTRAL WALL HERNIA REPAIR;  Surgeon: Schultze Elspeth, MD;  Location[facility];  Service: General;  Laterality: N/A;   XI ROBOTIC ASSISTED LOWER ANTERIOR RESECTION N/A 05/25/2015   Procedure: XI ROBOTIC ASSISTED LOWER ANTERIOR RESECTION, , RIGID PROCTOSCOPY, RIGHT OOPHORECTOMY;  Surgeon: Elspeth Schultze, MD;  Location[facility];  Service: General;  Laterality: N/A;    Family History  Problem Relation Age of Onset   Coronary artery disease Mother 41   Hypertension Mother  Hyperlipidemia Mother    Diabetes Mellitus I Mother    Coronary artery disease Father    Hyperlipidemia Father    Hypertension Father    Cancer Sister        skin - non melanoma   Hyperlipidemia Brother    Cancer Maternal Uncle 3       pancreatic with mets to colon and prostate   Cirrhosis Maternal Uncle    Hypertension Maternal Grandmother    Diabetes Mellitus I Maternal Grandmother    Hyperlipidemia Maternal Grandmother    CVA Maternal Grandmother    Hypertension Maternal Grandfather    Coronary artery disease Maternal Grandfather 18   Hyperlipidemia Maternal Grandfather    Coronary artery disease Paternal Grandmother    Hypertension Paternal Grandmother    Hyperlipidemia Paternal Grandmother    Diabetes Mellitus I Paternal Grandmother    Hypertension Paternal Grandfather    Hyperlipidemia Paternal Grandfather    Coronary artery disease Paternal Grandfather     Social History:  reports that she quit smoking about 32 years ago. Her smoking use included cigarettes. She started  smoking about 39 years ago. She has a 1.8 pack-year smoking history. She has never used smokeless tobacco. She reports that she does not drink alcohol and does not use drugs.  Allergies:  Allergies  Allergen Reactions   Lidocaine  Swelling, Dermatitis, Rash and Other (See Comments)    Eyes swell shut; includes all -caine drugs except marcaine . EMLA  cream OK though   Bupropion Other (See Comments)    Not effective    Metronidazole  Nausea And Vomiting and Other (See Comments)    GI Intolerance and sleeplessness   Oxycodone  Other (See Comments)    NIGHTMARES. (tolerates hydrocodone  or tramadol  better)   Penicillins Nausea Only, Dermatitis and Rash   Sulfa Antibiotics Nausea And Vomiting, Dermatitis, Rash and Other (See Comments)   Pantoprazole  Other (See Comments)    not effective   Adhesive [Tape] Rash and Other (See Comments)    Blisters - can use paper tape   Doxycycline  Nausea Only and Other (See Comments)    GI Intolerance   Iron  Nausea Only and Other (See Comments)    GI Intolerance   Metoprolol  Nausea Only and Palpitations   Naltrexone-Bupropion Hcl Er Palpitations and Other (See Comments)    Nightmares, too   Wound Dressing Adhesive Other (See Comments), Rash and Dermatitis    Blisters - can use paper tape    Medications: I have reviewed the patient's current medications.  Results for orders placed or performed during the hospital encounter of 03/21/24 (from the past 48 hours)  Comprehensive metabolic panel     Status: Abnormal   Collection Time: 03/21/24  8:40 AM  Result Value Ref Range   Sodium 141 135 - 145 mmol/L   Potassium 3.5 3.5 - 5.1 mmol/L   Chloride 101 98 - 111 mmol/L   CO2 25 22 - 32 mmol/L   Glucose, Bld 136 (H) 70 - 99 mg/dL    Comment: Glucose reference range applies only to samples taken after fasting for at least 8 hours.   BUN 19 6 - 20 mg/dL   Creatinine, Ser 8.51 (H) 0.44 - 1.00 mg/dL   Calcium  10.3 8.9 - 10.3 mg/dL   Total Protein 8.7 (H)  6.5 - 8.1 g/dL   Albumin 3.5 3.5 - 5.0 g/dL   AST 12 (L) 15 - 41 U/L   ALT 6 0 - 44 U/L   Alkaline Phosphatase 107 38 - 126  U/L   Total Bilirubin 1.1 0.0 - 1.2 mg/dL   GFR, Estimated 41 (L) >60 mL/min    Comment: (NOTE) Calculated using the CKD-EPI Creatinine Equation (2021)    Anion gap 16 (H) 5 - 15    Comment: Performed at [facility], [address], [place], [place] [address]  CBC with Differential     Status: Abnormal   Collection Time: 03/21/24  8:40 AM  Result Value Ref Range   WBC 12.1 (H) 4.0 - 10.5 K/uL   RBC 4.84 3.87 - 5.11 MIL/uL   Hemoglobin 11.3 (L) 12.0 - 15.0 g/dL   HCT 62.0 63.9 - 53.9 %   MCV 78.3 (L) 80.0 - 100.0 fL   MCH 23.3 (L) 26.0 - 34.0 pg   MCHC 29.8 (L) 30.0 - 36.0 g/dL   RDW 80.3 (H) 88.4 - 84.4 %   Platelets 544 (H) 150 - 400 K/uL   nRBC 0.0 0.0 - 0.2 %   Neutrophils Relative % 76 %   Neutro Abs 9.3 (H) 1.7 - 7.7 K/uL   Lymphocytes Relative 13 %   Lymphs Abs 1.5 0.7 - 4.0 K/uL   Monocytes Relative 7 %   Monocytes Absolute 0.8 0.1 - 1.0 K/uL   Eosinophils Relative 0 %   Eosinophils Absolute 0.1 0.0 - 0.5 K/uL   Basophils Relative 1 %   Basophils Absolute 0.1 0.0 - 0.1 K/uL   Immature Granulocytes 3 %   Abs Immature Granulocytes 0.35 (H) 0.00 - 0.07 K/uL    Comment: Performed at [facility], [address] [place], [place] [address]  Protime-INR     Status: None   Collection Time: 03/21/24  8:40 AM  Result Value Ref Range   Prothrombin Time 14.9 11.4 - 15.2 seconds   INR 1.1 0.8 - 1.2    Comment: (NOTE) INR goal varies based on device and disease states. Performed at [facility], [address], [place], [place] [address]   I-Stat Lactic Acid, ED     Status: Abnormal   Collection Time: 03/21/24  8:51 AM  Result Value Ref Range   Lactic Acid, Venous 3.3 (HH) 0.5 - 1.9 mmol/L   Comment NOTIFIED PHYSICIAN   SARS Coronavirus 2 by RT PCR (hospital order, performed in [facility]  hospital lab) *cepheid single result test* Anterior Nasal Swab     Status: None   Collection Time: 03/21/24  9:05 AM   Specimen: Anterior Nasal Swab  Result Value Ref Range   SARS Coronavirus 2 by RT PCR NEGATIVE NEGATIVE    Comment: (NOTE) SARS-CoV-2 target nucleic acids are NOT DETECTED.  The SARS-CoV-2 RNA is generally detectable in upper and lower respiratory specimens during the acute phase of infection. The lowest concentration of SARS-CoV-2 viral copies this assay can detect is 250 copies / mL. A negative result does not preclude SARS-CoV-2 infection and should not be used as the sole basis for treatment or other patient management decisions.  A negative result may occur with improper specimen collection / handling, submission of specimen other than nasopharyngeal swab, presence of viral mutation(s) within the areas targeted by this assay, and inadequate number of viral copies (<250 copies / mL). A negative result must be combined with clinical observations, patient history, and epidemiological information.  Fact Sheet for Patients:   RoadLapTop.co.za  Fact Sheet for Healthcare Providers: http://kim-miller.com/  This test is not yet approved or  cleared by the [place]  FDA and has been  authorized for detection and/or diagnosis of SARS-CoV-2 by FDA under an Emergency Use Authorization (EUA).  This EUA will remain in effect (meaning this test can be used) for the duration of the COVID-19 declaration under Section 564(b)(1) of the Act, 21 U.S.C. section 360bbb-3(b)(1), unless the authorization is terminated or revoked sooner.  Performed at [facility], [address] [place], [place] [address]   I-Stat Lactic Acid, ED     Status: None   Collection Time: 03/21/24 12:29 PM  Result Value Ref Range   Lactic Acid, Venous 0.7 0.5 - 1.9 mmol/L  Urinalysis, w/ Reflex to Culture (Infection Suspected) -Urine,  Clean Catch     Status: Abnormal   Collection Time: 03/21/24  2:24 PM  Result Value Ref Range   Specimen Source URINE, CATHETERIZED    Color, Urine AMBER (A) YELLOW    Comment: BIOCHEMICALS MAY BE AFFECTED BY COLOR   APPearance CLOUDY (A) CLEAR   Specific Gravity, Urine 1.011 1.005 - 1.030   pH 8.0 5.0 - 8.0   Glucose, UA NEGATIVE NEGATIVE mg/dL   Hgb urine dipstick MODERATE (A) NEGATIVE   Bilirubin Urine NEGATIVE NEGATIVE   Ketones, ur NEGATIVE NEGATIVE mg/dL   Protein, ur 899 (A) NEGATIVE mg/dL   Nitrite POSITIVE (A) NEGATIVE   Leukocytes,Ua MODERATE (A) NEGATIVE   RBC / HPF >50 0 - 5 RBC/hpf   WBC, UA >50 0 - 5 WBC/hpf    Comment:        Reflex urine culture not performed if WBC <=10, OR if Squamous epithelial cells >5. If Squamous epithelial cells >5 suggest recollection.    Bacteria, UA MANY (A) NONE SEEN   Squamous Epithelial / HPF 0-5 0 - 5 /HPF   Mucus PRESENT     Comment: Performed at [facility], [address] [place], [place] [address]    CT ABDOMEN PELVIS W CONTRAST Result Date: 03/21/2024 CLINICAL DATA:  Vaginal bleeding with history of fistula. Prior history includes rectal adenocarcinoma. Crohn's disease. Colostomy. * Tracking Code: BO * EXAM: CT ABDOMEN AND PELVIS WITH CONTRAST TECHNIQUE: Multidetector CT imaging of the abdomen and pelvis was performed using the standard protocol following bolus administration of intravenous contrast. RADIATION DOSE REDUCTION: This exam was performed according to the departmental dose-optimization program which includes automated exposure control, adjustment of the mA and/or kV according to patient size and/or use of iterative reconstruction technique. CONTRAST:  80mL OMNIPAQUE  IOHEXOL  300 MG/ML  SOLN COMPARISON:  12/12/2023 FINDINGS: Lower chest: Bibasilar scarring. Normal heart size without pericardial or pleural effusion. Hepatobiliary: Mild hepatic steatosis. Normal gallbladder, without biliary ductal  dilatation. Pancreas: Normal, without mass or ductal dilatation. Spleen: Normal in size, without focal abnormality. Adrenals/Urinary Tract: Normal adrenal glands. Bilateral ureteric stents which originate in the renal pelves and terminate in the urinary bladder. Moderate bilateral hydronephrosis. Significantly increased on the left. Similar to minimally increased on the right. Decreased contrast excretion from both kidneys on delayed images. Bladder otherwise normal. Stomach/Bowel: Normal stomach, without wall thickening. Left abdominal wall colostomy.  Normal terminal ileum. No bowel obstruction.  Pelvic enterotomy. Vascular/Lymphatic: Aortic atherosclerosis. Persistent increased number, less so size of abdominal retroperitoneal nodes. Index retroaortic 9 mm node on 52/2 is increased from 7 mm on the prior exam (when remeasured). Ileocolic mesenteric nodes of up to 1.0 cm on 71/2, 1.2 cm on the prior exam (when remeasured). Reproductive: Hysterectomy. Suspect left-sided hydrosalpinx, as evidenced by somewhat cylindrical low-density 5.8 x 3.3 cm mass on 81/2. Similar. Other:  Presacral heterogeneous soft tissue density collection measures 8.6 x 7.0 cm on 88/2 versus 7.4 x 6.6 cm when remeasured in a similar fashion on the prior. The gas within has resolved. Musculoskeletal: Degenerative changes of both hips. L4-5 trans pedicle screw fixation. IMPRESSION: 1. Presacral soft tissue density collection is somewhat more confluent and mildly enlarged since 12/12/2023. Resolved gas within. This could represent chronic infection and/or neoplasm. No drainable fluido component. 2. Significantly increased left and similar to minimally increased right hydroureteronephrosis, presumably secondary to the presacral process. Bilateral ureteric stents in place. 3. Descending left abdominal wall colostomy with mildly increased abdominal retroperitoneal and similar ileocolic mesenteric adenopathy, infectious versus metastatic disease.  4. Suspicion of left-sided hydrosalpinx, similar. 5. Incidental findings, including: Aortic Atherosclerosis (ICD10-I70.0). Mild hepatic steatosis. Electronically Signed   By: Rockey Kilts M.D.   On: 03/21/2024 12:37    Review of Systems  Constitutional:  Positive for fatigue.  HENT: Negative.    Eyes: Negative.   Respiratory: Negative.    Cardiovascular: Negative.   Gastrointestinal:  Positive for abdominal pain.  Endocrine: Negative.   Genitourinary:  Positive for vaginal bleeding.  Musculoskeletal: Negative.   Skin: Negative.   Allergic/Immunologic: Negative.   Neurological: Negative.   Hematological: Negative.   Psychiatric/Behavioral: Negative.     Blood pressure (!) 152/84, pulse 87, temperature 99.5 F (37.5 C), temperature source Oral, resp. rate 16, height 5' 10.5 (1.791 m), weight 131.5 kg, SpO2 92%. Physical Exam Vitals reviewed.  Constitutional:      General: She is not in acute distress.    Appearance: Normal appearance. She is obese.  HENT:     Head: Normocephalic and atraumatic.     Right Ear: External ear normal.     Left Ear: External ear normal.     Nose: Nose normal.     Mouth/Throat:     Mouth: Mucous membranes are moist.     Pharynx: Oropharynx is clear.  Eyes:     General: No scleral icterus.    Extraocular Movements: Extraocular movements intact.     Conjunctiva/sclera: Conjunctivae normal.     Pupils: Pupils are equal, round, and reactive to light.  Cardiovascular:     Rate and Rhythm: Normal rate and regular rhythm.     Pulses: Normal pulses.     Heart sounds: Normal heart sounds.  Pulmonary:     Effort: Pulmonary effort is normal. No respiratory distress.     Breath sounds: Normal breath sounds.  Abdominal:     General: Abdomen is flat.     Palpations: Abdomen is soft.     Comments: There is mild diffuse tenderness  Musculoskeletal:        General: No swelling or deformity. Normal range of motion.     Cervical back: Normal range of motion  and neck supple.  Skin:    General: Skin is warm and dry.     Coloration: Skin is not jaundiced.  Neurological:     General: No focal deficit present.     Mental Status: She is alert and oriented to person, place, and time.  Psychiatric:        Mood and Affect: Mood normal.        Behavior: Behavior normal.     Assessment/Plan: The patient has some unexplained presacral thickening and inflammation.  At this point it seems reasonable to admit her to the medical service for IV hydration and pain management and broad-spectrum antibiotic therapy.  This week we can discuss her case  with Dr. Sheldon to see if he has any insight.  At this point there is no obvious indication for surgical intervention.  We will follow her closely with you.  Deward Null III 03/21/2024, 6:28 PM

## 2024-03-21 NOTE — Progress Notes (Signed)
 ED Pharmacy Antibiotic Sign Off An antibiotic consult was received from an ED provider for cefepime  per pharmacy dosing for IAI. A chart review was completed to assess appropriateness.   The following one time order(s) were placed:  Cefepime  2g  Further antibiotic and/or antibiotic pharmacy consults should be ordered by the admitting provider if indicated.   Thank you for allowing pharmacy to be a part of this patient's care.   Britta Eva Na, [facility]  Clinical Pharmacist 03/21/24 9:34 AM

## 2024-03-21 NOTE — ED Provider Notes (Addendum)
 [place] [facility] DE[facility]MENT AT [facility] Provider Note   CSN: [id number] Arrival date & time: 03/21/24  9186     Patient presents with: Post-op Problem   HETHER [name] is a 58 y.o. female.   HPI Patient with recent IR sclerosis of pelvic fluid collection.  Done at [facility] and had to stay 2 extra days due to difficulty awakening after procedure.  Had previous peritoneal vaginal fistula.  That was done on August 27.  Had been doing well for a week with no bleeding but then bleeding started again and now more pain.  States she has potentially had some chills 2.  States the vaginal bleeding began again.  States she began to feel bad and thought she could have had COVID initially.    Prior to Admission medications   Medication Sig Start Date End Date Taking? Authorizing Provider  bisoprolol  (ZEBETA ) 5 MG tablet Take 5 mg by mouth daily.    [provider]  cefadroxil  (DURICEF) 500 MG capsule Take 1 capsule (500 mg total) by mouth 2 (two) times daily. 12/12/23   Silver Wonda LABOR, PA  Coenzyme Q10 (COQ10) 200 MG CAPS Take 200 mg by mouth at bedtime.    [provider]  DULoxetine  (CYMBALTA ) 60 MG capsule Take 120 mg by mouth at bedtime.    [provider]  esomeprazole  (NEXIUM ) 40 MG capsule Take 1 capsule (40 mg total) by mouth 2 (two) times daily before a meal. Patient taking differently: Take 40 mg by mouth See admin instructions. Take 40 mg by mouth in the morning before breakfast and an additional 40 mg once a day as needed for unresolved reflux 12/22/15   Sheldon Standing, MD  famotidine  (PEPCID ) 20 MG tablet Take 20 mg by mouth 2 (two) times daily as needed for heartburn or indigestion.    [provider]  furosemide  (LASIX ) 20 MG tablet Take 20 mg by mouth daily as needed for edema.     [provider]  HYDROmorphone  (DILAUDID ) 4 MG tablet Take one tablet (4 mg dose) by mouth every 6 (six) hours as needed for Pain for up to 30  days. Max Daily Amount: 16 mg Patient taking differently: Take 4 mg by mouth every 6 (six) hours. 05/13/23     hyoscyamine  (LEVSIN ) 0.125 MG tablet Take 0.125 mg by mouth 3 (three) times daily as needed for cramping. 01/28/23   [provider]  Ibuprofen 200 MG CAPS Take 400 mg by mouth every 6 (six) hours as needed (for pain).    [provider]  levETIRAcetam  (KEPPRA ) 250 MG tablet Take 500 mg by mouth in the morning and at bedtime.    [provider]  levothyroxine  (SYNTHROID ) 50 MCG tablet Take 50 mcg by mouth daily before breakfast. 02/07/22   [provider]  Melatonin 3 MG TABS Take 3-6 mg by mouth at bedtime as needed (sleep).    [provider]  naloxone  (NARCAN ) nasal spray 4 mg/0.1 mL Place 0.4 mg into the nose once. 12/18/22   [provider]  oxybutynin  (DITROPAN  XL) 15 MG 24 hr tablet Take 15 mg by mouth at bedtime.    [provider]  polyethylene glycol (MIRALAX  / GLYCOLAX ) 17 g packet Take 17 g by mouth See admin instructions. Mix 17 grams into the recommended amount of a beverage and drink once a day- may take an additional 17 grams once a day as needed for constipation    [provider]  pregabalin  (LYRICA ) 225 MG capsule Take 225 mg by mouth 2 (two) times daily. 02/07/22   [provider]  promethazine  (PHENERGAN ) 25 MG tablet Take 1 tablet (25 mg total) by mouth every 6 (six) hours as needed for nausea or vomiting. 06/08/23   Curatolo, Adam, DO  rosuvastatin  (CRESTOR ) 5 MG tablet Take 5 mg by mouth every evening.    [provider]  tamsulosin  (FLOMAX ) 0.4 MG CAPS capsule Take 0.4 mg by mouth daily.    [provider]  triamcinolone  lotion (KENALOG ) 0.1 % Apply 1 application  topically 2 (two) times daily as needed (for itching- scalp).    [provider]  TYLENOL  500 MG tablet Take 1,000 mg by mouth every 6 (six) hours as needed for mild pain (pain score 1-3) or headache.     [provider]    Allergies: Lidocaine , Bupropion, Metronidazole , Oxycodone , Penicillins, Sulfa antibiotics, Pantoprazole , Adhesive [tape], Doxycycline , Iron , Metoprolol , and Wound dressing adhesive    Review of Systems  Updated Vital Signs BP (!) 152/80 (BP Location: Left Arm)   Pulse 77   Temp 99.1 F (37.3 C) (Oral)   Resp 16   Ht 5' 10.5 (1.791 m)   Wt 131.5 kg   SpO2 95%   BMI 41.02 kg/m   Physical Exam Vitals and nursing note reviewed.  HENT:     Head: Normocephalic.  Cardiovascular:     Rate and Rhythm: Regular rhythm.  Abdominal:     Tenderness: There is abdominal tenderness.     Comments: Lower abdominal tenderness without rebound or guarding.  Left upper quadrant ostomy.  Neurological:     Mental Status: She is alert.     (all labs ordered are listed, but only abnormal results are displayed) Labs Reviewed  COMPREHENSIVE METABOLIC PANEL WITH GFR - Abnormal; Notable for the following components:      Result Value   Glucose, Bld 136 (*)    Creatinine, Ser 1.48 (*)    Total Protein 8.7 (*)    AST 12 (*)    GFR, Estimated 41 (*)    Anion gap 16 (*)    All other components within normal limits  CBC WITH DIFFERENTIAL/PLATELET - Abnormal; Notable for the following components:   WBC 12.1 (*)    Hemoglobin 11.3 (*)    MCV 78.3 (*)    MCH 23.3 (*)    MCHC 29.8 (*)    RDW 19.6 (*)    Platelets 544 (*)    Neutro Abs 9.3 (*)    Abs Immature Granulocytes 0.35 (*)    All other components within normal limits  URINALYSIS, W/ REFLEX TO CULTURE (INFECTION SUSPECTED) - Abnormal; Notable for the following components:   Color, Urine AMBER (*)    APPearance CLOUDY (*)    Hgb urine dipstick MODERATE (*)    Protein, ur 100 (*)    Nitrite POSITIVE (*)    Leukocytes,Ua MODERATE (*)    Bacteria, UA MANY (*)    All other components within normal limits  I-STAT CG4 LACTIC ACID, ED - Abnormal; Notable for the following components:   Lactic Acid, Venous 3.3 (*)     All other components within normal limits  SARS CORONAVIRUS 2 BY RT PCR  CULTURE, BLOOD (ROUTINE X 2)  CULTURE, BLOOD (ROUTINE X 2)  URINE CULTURE  PROTIME-INR  I-STAT CG4 LACTIC ACID, ED    EKG: None  Radiology: CT ABDOMEN PELVIS W CONTRAST Result Date: 03/21/2024 CLINICAL DATA:  Vaginal bleeding with  history of fistula. Prior history includes rectal adenocarcinoma. Crohn's disease. Colostomy. * Tracking Code: BO * EXAM: CT ABDOMEN AND PELVIS WITH CONTRAST TECHNIQUE: Multidetector CT imaging of the abdomen and pelvis was performed using the standard protocol following bolus administration of intravenous contrast. RADIATION DOSE REDUCTION: This exam was performed according to the departmental dose-optimization program which includes automated exposure control, adjustment of the mA and/or kV according to patient size and/or use of iterative reconstruction technique. CONTRAST:  80mL OMNIPAQUE  IOHEXOL  300 MG/ML  SOLN COMPARISON:  12/12/2023 FINDINGS: Lower chest: Bibasilar scarring. Normal heart size without pericardial or pleural effusion. Hepatobiliary: Mild hepatic steatosis. Normal gallbladder, without biliary ductal dilatation. Pancreas: Normal, without mass or ductal dilatation. Spleen: Normal in size, without focal abnormality. Adrenals/Urinary Tract: Normal adrenal glands. Bilateral ureteric stents which originate in the renal pelves and terminate in the urinary bladder. Moderate bilateral hydronephrosis. Significantly increased on the left. Similar to minimally increased on the right. Decreased contrast excretion from both kidneys on delayed images. Bladder otherwise normal. Stomach/Bowel: Normal stomach, without wall thickening. Left abdominal wall colostomy.  Normal terminal ileum. No bowel obstruction.  Pelvic enterotomy. Vascular/Lymphatic: Aortic atherosclerosis. Persistent increased number, less so size of abdominal retroperitoneal nodes. Index retroaortic 9 mm node on 52/2 is increased  from 7 mm on the prior exam (when remeasured). Ileocolic mesenteric nodes of up to 1.0 cm on 71/2, 1.2 cm on the prior exam (when remeasured). Reproductive: Hysterectomy. Suspect left-sided hydrosalpinx, as evidenced by somewhat cylindrical low-density 5.8 x 3.3 cm mass on 81/2. Similar. Other: Presacral heterogeneous soft tissue density collection measures 8.6 x 7.0 cm on 88/2 versus 7.4 x 6.6 cm when remeasured in a similar fashion on the prior. The gas within has resolved. Musculoskeletal: Degenerative changes of both hips. L4-5 trans pedicle screw fixation. IMPRESSION: 1. Presacral soft tissue density collection is somewhat more confluent and mildly enlarged since 12/12/2023. Resolved gas within. This could represent chronic infection and/or neoplasm. No drainable fluido component. 2. Significantly increased left and similar to minimally increased right hydroureteronephrosis, presumably secondary to the presacral process. Bilateral ureteric stents in place. 3. Descending left abdominal wall colostomy with mildly increased abdominal retroperitoneal and similar ileocolic mesenteric adenopathy, infectious versus metastatic disease. 4. Suspicion of left-sided hydrosalpinx, similar. 5. Incidental findings, including: Aortic Atherosclerosis (ICD10-I70.0). Mild hepatic steatosis. Electronically Signed   By: Rockey Kilts M.D.   On: 03/21/2024 12:37     Procedures   Medications Ordered in the ED  lactated ringers  bolus 1,000 mL (0 mLs Intravenous Stopped 03/21/24 1044)  ceFEPIme  (MAXIPIME ) 2 g in sodium chloride  0.9 % 100 mL IVPB (0 g Intravenous Stopped 03/21/24 0944)    And  metroNIDAZOLE  (FLAGYL ) IVPB 500 mg (0 mg Intravenous Stopped 03/21/24 1044)  HYDROmorphone  (DILAUDID ) injection 1 mg (1 mg Intravenous Given 03/21/24 0929)  ondansetron  (ZOFRAN ) injection 4 mg (4 mg Intravenous Given 03/21/24 0930)  iohexol  (OMNIPAQUE ) 300 MG/ML solution 80 mL (80 mLs Intravenous Contrast Given 03/21/24 1148)  prochlorperazine   (COMPAZINE ) injection 5 mg (5 mg Intravenous Given 03/21/24 1250)                                    Medical Decision Making Amount and/or Complexity of Data Reviewed Labs: ordered. Radiology: ordered.  Risk Prescription drug management.   Patient with recent pelvic ablation.  Presacral area.  Had a fistula.  Now began to bleed and have more pain.  Potential chills or  fever also.  Differential diagnosis does include intra-abdominal infection.  Lactic acid is elevated.  Temperature orally of 99.2 here.  However with elevated lactic acid increasing pain and chills will give dose of Zosyn .  Will get CT scan to further evaluate.  I reviewed note from recent hospital admission.  White count is mildly elevated.  Creatinine also mildly increased from 1 up to 1.5.  CT scan done and showed  Continued fluid collection.  Also potentially worsening hydronephrosis on the left.  Will discuss with [facility] about transfer for further evaluation of potential infection.  Discussed with transfer line.  Today connected with me Dr. Fermin from urology.  Recommended inpatient medicine admission.  Will discuss with medicine service.  [facility] has the patient on the list for potential admission but no available beds.  Unknown time when she would be on the list.  Discussed with Dr. Vianne who will see patient but potentially just keep in the ER.  Discussed with Dr. Curvin who will also consult.    Final diagnoses:  Pelvic abscess in female    ED Discharge Orders     None          Patsey Lot, MD 03/21/24 1439    Patsey Lot, MD 03/21/24 [phone]

## 2024-03-21 NOTE — Consult Note (Signed)
 [facility] Medical Consultation  Lindsay [name] FMW:[id number] DOB: [birth year] DOA: 03/21/2024 PCP: Teresa Channel, MD   Requesting physician: Dr Patsey Date of consultation: 03/21/2024 Reason for consultation: medical management while waiting on a bed at [facility]  HPI:  This is a 58 year old female with history of rectal cancer, status post surgical resection and ostomy creation, chronic obstructive uropathy with bilateral ureteral stents due to hydronephrosis, hypertension, hyperlipidemia, hypothyroidism, seizure disorder, chronic pain syndrome, peritoneal vaginal fistula with recurrent bleeding, who comes into the hospital with lower abdominal pain as well as worsening vaginal bleeding over the last day.  She was recently hospitalized just a week ago at [facility] and underwent direct access bleomycin sclerotherapy by IR, I presume for her vaginal bleeding/fistula.  She tells me that the procedure was not really successful since she has had continuous bleeding, but now pain is worse.  She denies any fever or chills, denies any nausea or vomiting.  She has no chest pain or shortness of breath.  In the ED here she has a low-grade temp of 99.2, she is hypertensive and satting well on room air.  Blood work shows a creatinine of 1.48, white count 12.1, initial lactic acid 3.3 and improved to 0.7 after fluids.  Urinalysis shows many bacteria and pyuria. CT abdomen pelvis showed her known soft tissue fluid collection is slightly enlarged since May 2025, suggesting chronic infection versus neoplasm, no drainable fluid.  She has had slightly worsening right hydroureteronephrosis presumed due to her presacral process, stents are in place.  She was given antibiotics, EDP discussed with urology at [facility], they agree that she needs to be admitted there, she is on a wait list and we are asked to help comanage her medical problems while waiting  Review of Systems:  As per HPI, otherwise 10 point review  of systems negative  Impression/Recommendations Principal problem Intra-abdominal fluid collection, peritoneal vaginal fistula, vaginal bleeding -she is status post recent admission at [facility], status post IR guided direct access bleomycin sclerotherapy, did not seem to be successful given recurrent bleeding - There is concern about this fluid being infected, has been started on antibiotics, continue.  Cultures are sent - Continue pain control with her home medications  Active problems Possible UTI-on antibiotics, urine cultures are pending  Essential hypertension-resume home medications  Bilateral hydronephrosis status post stents -slightly worsening hydronephrosis in the setting of pelvic fluid collection, she will be seen by urology at [facility]  Chronic pain-continue home Dilaudid   Hypothyroidism-continue Synthroid   History of rectal cancer -outpatient follow-up with oncology  Obesity, morbid-BMI over 40.  She would benefit from weight loss  Acute kidney injury-in the setting of hydronephrosis, slightly worsening fluid collection.  Most recent creatinine on discharge from [facility] was 0.89 in August 2025  History of seizures-continue Keppra   Depression-continue home medications  Scheduled Meds:  bisoprolol   5 mg Oral Daily   DULoxetine   120 mg Oral QHS   levETIRAcetam   500 mg Oral BID   [START ON 03/22/2024] levothyroxine   50 mcg Oral QAC breakfast   oxybutynin   15 mg Oral QHS   pantoprazole   40 mg Oral Daily   polyethylene glycol  17 g Oral See admin instructions   pregabalin   225 mg Oral BID   rosuvastatin   5 mg Oral QPM   tamsulosin   0.4 mg Oral Daily   Continuous Infusions: PRN Meds:.HYDROmorphone , hyoscyamine    Our hospitalist team will followup again tomorrow. Please contact me if I can be of assistance in the meanwhile.  Thank you for this consultation.   Past Medical History:  Diagnosis Date   Anxiety    Chemotherapy-induced neuropathy (HCC)    toes and  fingers numbness and tingling   Colorectal delayed anastomotic leak s/p resection & colostomy 07/12/2016 11/17/2015   Colostomy in place [facility]) 07/12/2016   due to anastomosis breakdown w/ colovaginal fistula   Colovaginal fistula s/p omentopexy repair 07/12/2016 07/10/2016   Complication of anesthesia    Depression    Family history of adverse reaction to anesthesia    mother-- ponv   Family history of breast cancer    Family history of pancreatic cancer    Family history of skin cancer    Gastroenteritis 12/20/2015   GERD (gastroesophageal reflux disease)    Hardware complicating wound infection (HCC) 08/03/2019   Hiatal hernia    History of cancer chemotherapy 02-20-2015 to 03-30-2015   History of cardiac murmur as a child    History of chemotherapy    History of chronic gastritis    History of hypertension    no issues since multiple abdminal sx's and chemo--- no medication since 12/ 2016   History of TMJ disorder    Hypercholesteremia    Hypertension    IBS (irritable bowel syndrome)    dx age 55   Intermittent abdominal pain    post-op multiple abdominal sx's    Microcytic anemia    Mild sleep apnea    per pt study 2014  very mild osa , no cpap recommended, recommeded wt loss and sleep routine   OA (osteoarthritis)    left knee /  left shoulder   Obesity    Parastomal hernia s/p lap repair w mesh 07/23/2018 07/23/2018   Pelvic abscess s/p drainage & omental pedicle flap 11/17/2015 06/23/2015   PONV (postoperative nausea and vomiting)    severe needs Scopolamine  PATCH    Portacath in place    right chest   Rectal adenocarcinoma (HCC) oncologis-  dr lanny-- after radiation/ chemo (ypT1, N0) --  no recurrence per last note 03/ 2018   dx 01-13-2015-- Stage IIIC (T3, N2, M0) post concurrent radiation and chemotherapy 02-20-2015 to 03-30-2015 /  05-25-2015 s/p  LAR w/ RSO (post-op complicated by late abscess and contained anatomotic leak w/ help percutaneous drainage and antibiotics)     Rectocutaneous fistula 07/10/2016   Rotator cuff tear, left    S/P radiation therapy 02/20/15-03/30/15   colon/rectal   Vitamin D  deficiency    Wears glasses    Wears glasses    Past Surgical History:  Procedure Laterality Date   ABDOMINAL HYSTERECTOMY  1996   uterus and cervix   BACK SURGERY  02/12/2018   lumbar surgery   COLON RESECTION N/A 07/12/2016   Procedure: LAPAROSCOPIC LYSIS OF ADHESIONS, OMENTOPEXY, HAND ASSISTED RESECTION OF  COLON, END TO END ANASTOMOSIS, COLOSTOMY;  Surgeon: Elspeth Schultze, MD;  Location: [facility];  Service: General;  Laterality: N/A;   COMBINED HYSTEROSCOPY DIAGNOSTIC / D&C  x2 1990's   CYSTOSCOPY W/ URETERAL STENT PLACEMENT Bilateral 02/16/2023   Procedure: CYSTOSCOPY WITH RETROGRADE PYELOGRAM BILATERAL STENT EXCHANGE;  Surgeon: Shona Layman BROCKS, MD;  Location: [facility];  Service: Urology;  Laterality: Bilateral;   CYSTOSCOPY WITH STENT PLACEMENT Bilateral 05/28/2023   Procedure: CYSTOSCOPY, BILATERAL RETROGRADE PYELOGRAM, BILATERAL STENT EXCHANGE;  Surgeon: Carolee Sherwood JONETTA DOUGLAS, MD;  Location: [facility];  Service: Urology;  Laterality: Bilateral;   DIAGNOSTIC LAPAROSCOPY  age 29 and age 18   EUS N/A 01/18/2015   Procedure:  LOWER ENDOSCOPIC ULTRASOUND (EUS);  Surgeon: Elsie Cree, MD;  Location: [facility];  Service: Endoscopy;  Laterality: N/A;   EXCISION OF SKIN TAG  11/17/2015   Procedure: EXCISION OF SKIN TAG;  Surgeon: Elspeth Schultze, MD;  Location: [facility];  Service: General;;   ILEO LOOP COLOSTOMY CLOSURE N/A 11/17/2015   Procedure: LAPAROSCOPIC DIVERTING LOOP ILEOSTOMY  DRAINAGE OF PELVIC ABSCESS;  Surgeon: Elspeth Schultze, MD;  Location: [facility];  Service: General;  Laterality: N/A;   ILEOSTOMY CLOSURE N/A 05/31/2016   Procedure: TAKEDOWN LOOP ILEOSTOMY;  Surgeon: Elspeth Schultze, MD;  Location: [facility];  Service: General;  Laterality: N/A;   IMPACTION REMOVAL  11/17/2015   Procedure: DISIMPACTION REMOVAL;  Surgeon: Elspeth Schultze, MD;  Location: [facility];  Service:  General;;   INSERTION OF MESH N/A 07/23/2018   Procedure: INSERTION OF MESH X2;  Surgeon: Schultze Elspeth, MD;  Location: [facility];  Service: General;  Laterality: N/A;   IR LUMBAR DISC ASPIRATION W/IMG GUIDE  07/01/2019   KNEE ARTHROSCOPY Left 1990's   KNEE ARTHROSCOPY W/ MENISCECTOMY Left 09/14/2009   and chondroplasty debridement   LAPAROSCOPIC LYSIS OF ADHESIONS  11/17/2015   Procedure: LAPAROSCOPIC LYSIS OF ADHESIONS;  Surgeon: Elspeth Schultze, MD;  Location: [facility];  Service: General;;   LUMBAR WOUND DEBRIDEMENT N/A 04/24/2018   Procedure: wound exploration, irrigation and debridement;  Surgeon: Joshua Alm RAMAN, MD;  Location[facility];  Service: Neurosurgery;  Laterality: N/A;  wound exploration, irrigation and debridement   LYSIS OF ADHESION N/A 07/23/2018   Procedure: LYSIS OF ADHESIONS;  Surgeon: Schultze Elspeth, MD;  Location: [facility];  Service: General;  Laterality: N/A;   PORT-A-CATH REMOVAL N/A 11/21/2016   Procedure: REMOVAL PORT-A-CATH;  Surgeon: Schultze Elspeth, MD;  Location: [facility];  Service: General;  Laterality: N/A;   PORTACATH PLACEMENT N/A 05/25/2015   Procedure: INSERTION PORT-A-CATH;  Surgeon: Elspeth Schultze, MD;  Location: [facility];  Service: General;  Laterality: N/A;-remains inplace Right chest.   ROTATOR CUFF REPAIR Right 2006   TONSILLECTOMY  age 45   VENTRAL HERNIA REPAIR N/A 07/23/2018   Procedure: LAPAROSCOPIC VENTRAL WALL HERNIA REPAIR;  Surgeon: Schultze Elspeth, MD;  Location: [facility];  Service: General;  Laterality: N/A;   XI ROBOTIC ASSISTED LOWER ANTERIOR RESECTION N/A 05/25/2015   Procedure: XI ROBOTIC ASSISTED LOWER ANTERIOR RESECTION, , RIGID PROCTOSCOPY, RIGHT OOPHORECTOMY;  Surgeon: Elspeth Schultze, MD;  Location: [facility];  Service: General;  Laterality: N/A;   Social History:  reports that she quit smoking about 32 years ago. Her smoking use included cigarettes. She started smoking about 39 years ago. She has a 1.8 pack-year smoking history. She has never used  smokeless tobacco. She reports that she does not drink alcohol and does not use drugs.  Allergies  Allergen Reactions   Lidocaine  Swelling, Rash and Other (See Comments)    Eyes swell shut; includes all -caine drugs except marcaine . EMLA  cream OK though   Bupropion Other (See Comments)    Not effective   Metronidazole  Nausea And Vomiting and Other (See Comments)    GI Intolerance and sleeplessness   Oxycodone  Other (See Comments)    NIGHTMARES. (tolerates hydrocodone  or tramadol  better)   Penicillins Nausea Only and Rash   Sulfa Antibiotics Nausea And Vomiting and Rash   Pantoprazole  Other (See Comments)    not effective   Adhesive [Tape] Rash and Other (See Comments)    Blisters - can use paper tape   Doxycycline  Nausea Only and Other (See  Comments)    GI Intolerance   Iron  Nausea Only and Other (See Comments)    GI Intolerance   Metoprolol  Nausea Only and Palpitations   Wound Dressing Adhesive Rash and Other (See Comments)    Blisters - can use paper tape   Family History  Problem Relation Age of Onset   Coronary artery disease Mother 81   Hypertension Mother    Hyperlipidemia Mother    Diabetes Mellitus I Mother    Coronary artery disease Father    Hyperlipidemia Father    Hypertension Father    Cancer Sister        skin - non melanoma   Hyperlipidemia Brother    Cancer Maternal Uncle 47       pancreatic with mets to colon and prostate   Cirrhosis Maternal Uncle    Hypertension Maternal Grandmother    Diabetes Mellitus I Maternal Grandmother    Hyperlipidemia Maternal Grandmother    CVA Maternal Grandmother    Hypertension Maternal Grandfather    Coronary artery disease Maternal Grandfather 3   Hyperlipidemia Maternal Grandfather    Coronary artery disease Paternal Grandmother    Hypertension Paternal Grandmother    Hyperlipidemia Paternal Grandmother    Diabetes Mellitus I Paternal Grandmother    Hypertension Paternal Grandfather    Hyperlipidemia  Paternal Grandfather    Coronary artery disease Paternal Grandfather     Prior to Admission medications   Medication Sig Start Date End Date Taking? Authorizing Provider  bisoprolol  (ZEBETA ) 5 MG tablet Take 5 mg by mouth daily.    [provider]  cefadroxil  (DURICEF) 500 MG capsule Take 1 capsule (500 mg total) by mouth 2 (two) times daily. 12/12/23   Silver Wonda LABOR, PA  Coenzyme Q10 (COQ10) 200 MG CAPS Take 200 mg by mouth at bedtime.    [provider]  DULoxetine  (CYMBALTA ) 60 MG capsule Take 120 mg by mouth at bedtime.    [provider]  esomeprazole  (NEXIUM ) 40 MG capsule Take 1 capsule (40 mg total) by mouth 2 (two) times daily before a meal. Patient taking differently: Take 40 mg by mouth See admin instructions. Take 40 mg by mouth in the morning before breakfast and an additional 40 mg once a day as needed for unresolved reflux 12/22/15   Sheldon Standing, MD  famotidine  (PEPCID ) 20 MG tablet Take 20 mg by mouth 2 (two) times daily as needed for heartburn or indigestion.    [provider]  furosemide  (LASIX ) 20 MG tablet Take 20 mg by mouth daily as needed for edema.     [provider]  HYDROmorphone  (DILAUDID ) 4 MG tablet Take one tablet (4 mg dose) by mouth every 6 (six) hours as needed for Pain for up to 30 days. Max Daily Amount: 16 mg Patient taking differently: Take 4 mg by mouth every 6 (six) hours. 05/13/23     hyoscyamine  (LEVSIN ) 0.125 MG tablet Take 0.125 mg by mouth 3 (three) times daily as needed for cramping. 01/28/23   [provider]  Ibuprofen 200 MG CAPS Take 400 mg by mouth every 6 (six) hours as needed (for pain).    [provider]  levETIRAcetam  (KEPPRA ) 250 MG tablet Take 500 mg by mouth in the morning and at bedtime.    [provider]  levothyroxine  (SYNTHROID ) 50 MCG tablet Take 50 mcg by mouth daily before breakfast. 02/07/22   [provider]  Melatonin 3 MG TABS Take 3-6 mg by  mouth at  bedtime as needed (sleep).    [provider]  naloxone  (NARCAN ) nasal spray 4 mg/0.1 mL Place 0.4 mg into the nose once. 12/18/22   [provider]  oxybutynin  (DITROPAN  XL) 15 MG 24 hr tablet Take 15 mg by mouth at bedtime.    [provider]  polyethylene glycol (MIRALAX  / GLYCOLAX ) 17 g packet Take 17 g by mouth See admin instructions. Mix 17 grams into the recommended amount of a beverage and drink once a day- may take an additional 17 grams once a day as needed for constipation    [provider]  pregabalin  (LYRICA ) 225 MG capsule Take 225 mg by mouth 2 (two) times daily. 02/07/22   [provider]  promethazine  (PHENERGAN ) 25 MG tablet Take 1 tablet (25 mg total) by mouth every 6 (six) hours as needed for nausea or vomiting. 06/08/23   Curatolo, Adam, DO  rosuvastatin  (CRESTOR ) 5 MG tablet Take 5 mg by mouth every evening.    [provider]  tamsulosin  (FLOMAX ) 0.4 MG CAPS capsule Take 0.4 mg by mouth daily.    [provider]  triamcinolone  lotion (KENALOG ) 0.1 % Apply 1 application  topically 2 (two) times daily as needed (for itching- scalp).    [provider]  TYLENOL  500 MG tablet Take 1,000 mg by mouth every 6 (six) hours as needed for mild pain (pain score 1-3) or headache.    [provider]   Physical Exam: Blood pressure (!) 152/80, pulse 77, temperature 99.1 F (37.3 C), temperature source Oral, resp. rate 16, height 5' 10.5 (1.791 m), weight 131.5 kg, SpO2 95%. Vitals:   03/21/24 1400 03/21/24 1500  BP: (!) 150/84 (!) 152/80  Pulse: 84 77  Resp: 16 16  Temp:    SpO2: 94% 95%    General: She is in no significant distress, slightly sleepy Eyes: No scleral icterus ENT: Moist mucous membranes Neck: Supple, no masses Cardiovascular: Regular rate and rhythm, no murmurs, no peripheral edema Respiratory: Clear bilaterally, no wheezing Abdomen: Diffuse tenderness in the lower  quadrants Skin: No rashes seen Psychiatric: Normal mood and affect Neurologic: Nonfocal, equal strength  Labs on Admission:  Basic Metabolic Panel: Recent Labs  Lab 03/21/24 0840  NA 141  K 3.5  CL 101  CO2 25  GLUCOSE 136*  BUN 19  CREATININE 1.48*  CALCIUM  10.3   Liver Function Tests: Recent Labs  Lab 03/21/24 0840  AST 12*  ALT 6  ALKPHOS 107  BILITOT 1.1  PROT 8.7*  ALBUMIN 3.5   No results for input(s): LIPASE, AMYLASE in the last 168 hours. No results for input(s): AMMONIA in the last 168 hours. CBC: Recent Labs  Lab 03/21/24 0840  WBC 12.1*  NEUTROABS 9.3*  HGB 11.3*  HCT 37.9  MCV 78.3*  PLT 544*   Cardiac Enzymes: No results for input(s): CKTOTAL, CKMB, CKMBINDEX, TROPONINI in the last 168 hours. BNP: Invalid input(s): POCBNP CBG: No results for input(s): GLUCAP in the last 168 hours.  Radiological Exams on Admission: CT ABDOMEN PELVIS W CONTRAST Result Date: 03/21/2024 CLINICAL DATA:  Vaginal bleeding with history of fistula. Prior history includes rectal adenocarcinoma. Crohn's disease. Colostomy. * Tracking Code: BO * EXAM: CT ABDOMEN AND PELVIS WITH CONTRAST TECHNIQUE: Multidetector CT imaging of the abdomen and pelvis was performed using the standard protocol following bolus administration of intravenous contrast. RADIATION DOSE REDUCTION: This exam was performed according to the departmental dose-optimization program which includes automated exposure control, adjustment of the  mA and/or kV according to patient size and/or use of iterative reconstruction technique. CONTRAST:  80mL OMNIPAQUE  IOHEXOL  300 MG/ML  SOLN COMPARISON:  12/12/2023 FINDINGS: Lower chest: Bibasilar scarring. Normal heart size without pericardial or pleural effusion. Hepatobiliary: Mild hepatic steatosis. Normal gallbladder, without biliary ductal dilatation. Pancreas: Normal, without mass or ductal dilatation. Spleen: Normal in size, without focal abnormality.  Adrenals/Urinary Tract: Normal adrenal glands. Bilateral ureteric stents which originate in the renal pelves and terminate in the urinary bladder. Moderate bilateral hydronephrosis. Significantly increased on the left. Similar to minimally increased on the right. Decreased contrast excretion from both kidneys on delayed images. Bladder otherwise normal. Stomach/Bowel: Normal stomach, without wall thickening. Left abdominal wall colostomy.  Normal terminal ileum. No bowel obstruction.  Pelvic enterotomy. Vascular/Lymphatic: Aortic atherosclerosis. Persistent increased number, less so size of abdominal retroperitoneal nodes. Index retroaortic 9 mm node on 52/2 is increased from 7 mm on the prior exam (when remeasured). Ileocolic mesenteric nodes of up to 1.0 cm on 71/2, 1.2 cm on the prior exam (when remeasured). Reproductive: Hysterectomy. Suspect left-sided hydrosalpinx, as evidenced by somewhat cylindrical low-density 5.8 x 3.3 cm mass on 81/2. Similar. Other: Presacral heterogeneous soft tissue density collection measures 8.6 x 7.0 cm on 88/2 versus 7.4 x 6.6 cm when remeasured in a similar fashion on the prior. The gas within has resolved. Musculoskeletal: Degenerative changes of both hips. L4-5 trans pedicle screw fixation. IMPRESSION: 1. Presacral soft tissue density collection is somewhat more confluent and mildly enlarged since 12/12/2023. Resolved gas within. This could represent chronic infection and/or neoplasm. No drainable fluido component. 2. Significantly increased left and similar to minimally increased right hydroureteronephrosis, presumably secondary to the presacral process. Bilateral ureteric stents in place. 3. Descending left abdominal wall colostomy with mildly increased abdominal retroperitoneal and similar ileocolic mesenteric adenopathy, infectious versus metastatic disease. 4. Suspicion of left-sided hydrosalpinx, similar. 5. Incidental findings, including: Aortic Atherosclerosis  (ICD10-I70.0). Mild hepatic steatosis. Electronically Signed   By: Rockey Kilts M.D.   On: 03/21/2024 12:37   Mikias Lanz [facility]  If 7PM-7AM, please contact night-coverage [email or web address]  03/21/2024, 3:32 PM

## 2024-03-22 DIAGNOSIS — Z933 Colostomy status: Secondary | ICD-10-CM | POA: Diagnosis not present

## 2024-03-22 DIAGNOSIS — R1084 Generalized abdominal pain: Secondary | ICD-10-CM | POA: Diagnosis not present

## 2024-03-22 DIAGNOSIS — N179 Acute kidney failure, unspecified: Secondary | ICD-10-CM | POA: Diagnosis not present

## 2024-03-22 DIAGNOSIS — G9341 Metabolic encephalopathy: Secondary | ICD-10-CM | POA: Diagnosis not present

## 2024-03-22 DIAGNOSIS — D638 Anemia in other chronic diseases classified elsewhere: Secondary | ICD-10-CM | POA: Diagnosis not present

## 2024-03-22 DIAGNOSIS — N1330 Unspecified hydronephrosis: Secondary | ICD-10-CM | POA: Diagnosis not present

## 2024-03-22 DIAGNOSIS — E872 Acidosis, unspecified: Secondary | ICD-10-CM | POA: Diagnosis not present

## 2024-03-22 DIAGNOSIS — I82412 Acute embolism and thrombosis of left femoral vein: Secondary | ICD-10-CM | POA: Diagnosis not present

## 2024-03-22 DIAGNOSIS — E039 Hypothyroidism, unspecified: Secondary | ICD-10-CM | POA: Diagnosis not present

## 2024-03-22 DIAGNOSIS — I10 Essential (primary) hypertension: Secondary | ICD-10-CM | POA: Diagnosis not present

## 2024-03-22 DIAGNOSIS — N3000 Acute cystitis without hematuria: Secondary | ICD-10-CM | POA: Diagnosis not present

## 2024-03-22 DIAGNOSIS — M1712 Unilateral primary osteoarthritis, left knee: Secondary | ICD-10-CM | POA: Diagnosis not present

## 2024-03-22 DIAGNOSIS — R188 Other ascites: Secondary | ICD-10-CM | POA: Diagnosis not present

## 2024-03-22 DIAGNOSIS — Z743 Need for continuous supervision: Secondary | ICD-10-CM | POA: Diagnosis not present

## 2024-03-22 DIAGNOSIS — R310 Gross hematuria: Secondary | ICD-10-CM | POA: Diagnosis not present

## 2024-03-22 DIAGNOSIS — K5090 Crohn's disease, unspecified, without complications: Secondary | ICD-10-CM | POA: Diagnosis not present

## 2024-03-22 DIAGNOSIS — C20 Malignant neoplasm of rectum: Secondary | ICD-10-CM | POA: Diagnosis not present

## 2024-03-22 DIAGNOSIS — R1900 Intra-abdominal and pelvic swelling, mass and lump, unspecified site: Secondary | ICD-10-CM | POA: Diagnosis not present

## 2024-03-22 DIAGNOSIS — E876 Hypokalemia: Secondary | ICD-10-CM | POA: Diagnosis not present

## 2024-03-22 DIAGNOSIS — R1909 Other intra-abdominal and pelvic swelling, mass and lump: Secondary | ICD-10-CM | POA: Diagnosis not present

## 2024-03-22 DIAGNOSIS — G894 Chronic pain syndrome: Secondary | ICD-10-CM | POA: Diagnosis not present

## 2024-03-22 DIAGNOSIS — N739 Female pelvic inflammatory disease, unspecified: Secondary | ICD-10-CM | POA: Diagnosis not present

## 2024-03-22 DIAGNOSIS — N939 Abnormal uterine and vaginal bleeding, unspecified: Secondary | ICD-10-CM | POA: Diagnosis not present

## 2024-03-22 DIAGNOSIS — K6289 Other specified diseases of anus and rectum: Secondary | ICD-10-CM | POA: Diagnosis not present

## 2024-03-22 DIAGNOSIS — E7800 Pure hypercholesterolemia, unspecified: Secondary | ICD-10-CM | POA: Diagnosis not present

## 2024-03-22 DIAGNOSIS — I259 Chronic ischemic heart disease, unspecified: Secondary | ICD-10-CM | POA: Diagnosis not present

## 2024-03-22 LAB — BLOOD CULTURE ID PANEL (REFLEXED) - BCID2

## 2024-03-22 LAB — BASIC METABOLIC PANEL WITH GFR
Anion gap: 17 — ABNORMAL HIGH (ref 5–15)
Anion gap: 15 (ref 5–15)
BUN: 18 mg/dL (ref 6–20)
BUN: 19 mg/dL (ref 6–20)
CO2: 22 mmol/L (ref 22–32)
CO2: 21 mmol/L — ABNORMAL LOW (ref 22–32)
Calcium: 10.3 mg/dL (ref 8.9–10.3)
Calcium: 8.8 mg/dL — ABNORMAL LOW (ref 8.9–10.3)
Chloride: 106 mmol/L (ref 98–111)
Chloride: 108 mmol/L (ref 98–111)
Creatinine, Ser: 1.54 mg/dL — ABNORMAL HIGH (ref 0.44–1.00)
Creatinine, Ser: 1.36 mg/dL — ABNORMAL HIGH (ref 0.44–1.00)
GFR, Estimated: 39 mL/min — ABNORMAL LOW (ref >60)
GFR, Estimated: 45 mL/min — ABNORMAL LOW (ref >60)
Glucose, Bld: 99 mg/dL (ref 70–99)
Glucose, Bld: 94 mg/dL (ref 70–99)
Potassium: 3.3 mmol/L — ABNORMAL LOW (ref 3.5–5.1)
Potassium: 3 mmol/L — ABNORMAL LOW (ref 3.5–5.1)
Sodium: 145 mmol/L (ref 135–145)
Sodium: 143 mmol/L (ref 135–145)

## 2024-03-22 LAB — CBC
HCT: 34.3 % — ABNORMAL LOW (ref 36.0–46.0)
Hemoglobin: 10.1 g/dL — ABNORMAL LOW (ref 12.0–15.0)
MCH: 23.1 pg — ABNORMAL LOW (ref 26.0–34.0)
MCHC: 29.4 g/dL — ABNORMAL LOW (ref 30.0–36.0)
MCV: 78.5 fL — ABNORMAL LOW (ref 80.0–100.0)
Platelets: 484 K/uL — ABNORMAL HIGH (ref 150–400)
RBC: 4.37 MIL/uL (ref 3.87–5.11)
RDW: 19.6 % — ABNORMAL HIGH (ref 11.5–15.5)
WBC: 11.4 K/uL — ABNORMAL HIGH (ref 4.0–10.5)
nRBC: 0 % (ref 0.0–0.2)

## 2024-03-22 LAB — URINE CULTURE

## 2024-03-22 MED ORDER — POTASSIUM CHLORIDE CRYS ER 20 MEQ PO TBCR
40.0000 meq | EXTENDED_RELEASE_TABLET | Freq: Once | ORAL | Status: DC
  Filled 2024-03-22: qty 2

## 2024-03-22 NOTE — H&P (Signed)
 ------------------------------------------------------------------------------- Attestation signed by Dempsey Morton Fava, MD at 03/23/2024  1:09 AM (Updated) TEACHING ATTENDING ATTESTATION I saw and examined the patient with the resident. We discussed the assessment and plan. In addition, we will add a blood cultures, UA and U culture for reported bacteremia at OSH with fever at home and leukocytosis.   Electronically Signed by: Dempsey Morton Fava, MD, Attending Physician 03/23/2024 1:08 AM  -------------------------------------------------------------------------------     Hematology and Oncology Initial Visit  Patient: Lindsay Little Date: 03/22/2024 MRN: [id number]  History of Present Illness: Lindsay Little is a 58 year old female with a PMH of rectal cancer (2017) s/p surgical resection and ostomy creation, chronic obstructive uropathy with bilateral ureteral stents due to hydronephrosis, hypertension, hyperlipidemia, hypothyroidism, seizure disorder, chronic pain syndrome, peritoneal vaginal fistula with recurrent bleeding who presented from an outside hospital Lindsay Little/Lindsay Little) with a chief complaint of lower abdominal pain.  Per the patient she presented to the ED because she was having continued abdominal pain following a procedure that she had done with IR a little over a week ago.  She stated that last Monday (7 days ago) that she developed a fever of up to 102 at home that was accompanied by sweats and chills.  Her symptoms largely resolved last week but she began to feel sweaty again with lower abdominal pain and decided to present to the ED for evaluation.  She denied any fevers within the last week, changes in her appetite, shortness of breath, cough, nausea/vomiting, myalgias/arthralgias or any other changes.  Per chart review the patient was recently hospitalized at [facility] for direct access bleomycin sclerotherapy with IR to treat ongoing vaginal bleeding from a peritoneal  vaginal fistula. Her rectal adenocarcioma was T3N2M0, Stage IIIC , ypT1N0 after neoadjuvant chemotherapy and radiation. It was diagnosed in 01/2015. Treated with surgery with neoadjuvant chemo and radiation. She has permanent colostomy bag in place. She completed 5 years surveillance.   In the ED at OSH she had a low-grade temp of 99.2 and was hypertensive.  Her labs were remarkable for: Creatinine of 1.48, white count 12.1, initial lactic acid 3.3 and improved to 0.7 after fluids. Urinalysis shows many bacteria and pyuria. CT abdomen pelvis showed her known soft tissue fluid collection is slightly enlarged since May 2025, suggesting chronic infection versus neoplasm, no drainable fluid. She has had slightly worsening right hydroureteronephrosis presumed due to her presacral process, stents are in place. She was given antibiotics and transferred to [facility] for continued management.    Urology evaluated the patient on arrival and recommended a trial Foley catheter placement with IV hydration to see if AKI can resolve.  If her renal function does not improve they recommended IR consultation for Uni/bilateral PCN placement.  Medications: Current Rx ordered in [facility][1]  Allergies: Allergies[2]  Past Medical History:  Active Ambulatory Problems    Diagnosis Date Noted  . Anemia of chronic disease 06/14/2015  . Cavernous hemangioma of liver 02/07/2015  . Chronic pain syndrome 07/24/2018  . Colostomy in place [facility]) 07/12/2016  . Crohn's disease (HCC) 02/07/2015  . Depression 01/21/2018  . Family history of ischemic heart disease 02/21/2014  . Family history of pancreatic cancer 04/20/2019  . Family history of breast cancer 04/20/2019  . Family history of skin cancer 04/20/2019  . Hypertension 04/20/2019  . IBS (irritable bowel syndrome) 04/20/2019  . Morbid obesity with body mass index of 40.0-49.9 (HCC) 02/21/2014  . Osteoarthritis of left knee 02/07/2015  . Parastomal hernia 07/23/2018  .  Pure hypercholesterolemia 02/21/2014  . Rectovaginal fistula 01/12/2018  . Rectal adenocarcinoma (HCC) 01/31/2015  . S/P lumbar spinal fusion 02/12/2018  . Mixed incontinence urge and stress 11/29/2021  . Urinary frequency 11/29/2021  . Nocturia 11/29/2021  . Overactive bladder 01/07/2022  . Hydroureteronephrosis 03/11/2022  . Bladder spasms 09/16/2022  . Gross hematuria 09/16/2022  . Continuous leakage of urine 09/16/2022  . Near syncope 01/08/2024  . AKI (acute kidney injury) 01/08/2024  . Chest pain 01/08/2024  . Left knee pain 01/08/2024  . Rectal cancer and draining pelvic collection S/P direct access bleomycin sclerotherapy with IR on 03/10/24 03/10/2024  . Admitted for overnight observation 03/10/2024  . Slow to wake up after anesthesia, initial encounter 03/10/2024  . Seizure disorder    (CMD) 03/10/2024  . Hyperlipidemia 03/10/2024  . Hypothyroidism 03/10/2024  . Hypoxia 03/11/2024   Resolved Ambulatory Problems    Diagnosis Date Noted  . No Resolved Ambulatory Problems   Past Medical History:  Diagnosis Date  . Abdominal pain   . Allergy   . Anemia   . Arthritis   . Colostomy present    (CMD) 07/12/2016  . Constipation   . Difficulty sleeping   . Flank pain   . Frequency of urination   . Hydroureter on right 12/20/2015  . Hypercholesterolemia   . Hypokalemia   . Incontinence   . Ischemic heart disease   . Morbid obesity (CMD)   . Pelvic abscess in female   . 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  . PONV (postoperative nausea and vomiting)   . Rectocutaneous fistula   . Short of breath on exertion   . Tinnitus   . Urinary dysfunction   . Vaginal pain   . Weakness   . Wears glasses     Social History:  Social History   Socioeconomic History  . Marital status: Married    Spouse name: Not on file  . Number of children: Not on file  . Years of education: Not on file  . Highest education level: Not on file  Occupational History  . Not on file   Tobacco Use  . Smoking status: Former    Current packs/day: 0.00    Types: Cigarettes    Quit date: 07/16/1991    Years since quitting: 32.7  . Smokeless tobacco: Never  Vaping Use  . Vaping status: Never Used  Substance and Sexual Activity  . Alcohol use: Yes    Comment: VERY LITTLE  . Drug use: Never  . Sexual activity: Not Currently  Other Topics Concern  . Not on file  Social History Narrative  . Not on file   Social Drivers of Health   Food Insecurity: Low Risk  (03/10/2024)   Food vital sign   . Within the past 12 months, you worried that your food would run out before you got money to buy more: Never true   . Within the past 12 months, the food you bought just didn't last and you didn't have money to get more: Never true  Transportation Needs: No Transportation Needs (03/10/2024)   Transportation   . In the past 12 months, has lack of reliable transportation kept you from medical appointments, meetings, work or from getting things needed for daily living? : No  Safety: Low Risk  (03/10/2024)   Safety   . How often does anyone, including family and friends, physically hurt you?: Never   . How often does anyone, including family and friends, insult or talk down to you?:  Never   . How often does anyone, including family and friends, threaten you with harm?: Never   . How often does anyone, including family and friends, scream or curse at you?: Never  Living Situation: Low Risk  (03/10/2024)   Living Situation   . What is your living situation today?: I have a steady place to live   . Think about the place you live. Do you have problems with any of the following? Choose all that apply:: None/None on this list    PHYSICAL EXAM: BP 136/81   Pulse 67   Temp 98.1 F (36.7 C) (Oral)   Resp 13   Ht 1.778 m (5' 10)   Wt 120 kg (263 lb 10.7 oz)   SpO2 97%   BMI 37.83 kg/m  GENERAL: Well developed, well nourished, in no acute distress. HEENT: Head is normocephalic and  atraumatic. No scleral icterus. CARDIOVASCULAR: Regular rate and rhythm without any murmurs, rubs or gallops. LUNGS: Clear to auscultation bilaterally without any wheezes, rhonchi or crackles. ABDOMEN: Soft nontender nondistended. EXTREMITIES: No edema. SKIN: No visible rash. NEUROLOGIC: Alert and oriented x 3. No focal deficits PSYCH: Pleasant appropriate affect and normal thought content.   Laboratory Data:  Review Flowsheet  More data may exist      Latest Ref Rng & Units 03/22/2024 03/12/2024 03/11/2024 01/10/2024 01/09/2024 01/08/2024 01/07/2024  Common Labs  Hemoglobin 12.3 - 15.3 g/dL 9.1*  - 89.0*  89.0*  88.8*  11.0*  12.1*   Hematocrit 35.9 - 44.6 % 28.0*  - 34.9*  34.2*  35.2*  33.3*  37.2   Platelet Count (Plt) 150 - 450 10*3/uL 397  - 224  276  298  343  417   WBC 4.40 - 11.00 10*3/uL 11.40*  - 6.80  5.60  7.30  8.90  11.50*   Sodium 136 - 145 mmol/L 142  - 138  139  139  137  135*   Potassium 3.5 - 5.1 mmol/L 3.2*  - 3.6  3.8  4.7  3.8  3.8   Glucose 70 - 99 mg/dL 875*  - 897*  84  894*  133*  111*   Magnesium  1.9 - 2.7 mg/dL - 1.6*  1.7*  - - 1.8*  -  Blood Urea  Nitrogen 7 - 25 mg/dL 20  - 13  10  14  14  14    Creatinine 0.60 - 1.20 mg/dL 8.60*  - 9.10  9.20  9.15  0.95  1.31*   Calcium  Level Total 8.6 - 10.3 mg/dL 8.9  - 9.5  9.4  9.2  9.2  9.6   Albumin 3.5 - 5.7 g/dL 3.0*  - - - - - 3.9   Aspartate Aminotransferase (AST) 13 - 39 U/L 8*  - - - - - 17   Alanine Aminotransferase 7 - 52 U/L 5*  - - - - - 10   Bilirubin Total 0.3 - 1.0 mg/dL 0.7  - - - - - 0.5   Total Protein 6.4 - 8.9 g/dL 6.9  - - - - - 8.0     Radiological Data: CT AP: 03/21/24 COMPARISON:  12/12/2023   FINDINGS:  Lower chest: Bibasilar scarring. Normal heart size without  pericardial or pleural effusion.   Hepatobiliary: Mild hepatic steatosis. Normal gallbladder, without  biliary ductal dilatation.   Pancreas: Normal, without mass or ductal dilatation.   Spleen: Normal in size, without focal  abnormality.   Adrenals/Urinary Tract: Normal adrenal glands. Bilateral ureteric  stents  which originate in the renal pelves and terminate in the  urinary bladder. Moderate bilateral hydronephrosis. Significantly  increased on the left. Similar to minimally increased on the right.  Decreased contrast excretion from both kidneys on delayed images.  Bladder otherwise normal.   Stomach/Bowel: Normal stomach, without wall thickening.   Left abdominal wall colostomy.  Normal terminal ileum.   No bowel obstruction.  Pelvic enterotomy.   Vascular/Lymphatic: Aortic atherosclerosis. Persistent increased  number, less so size of abdominal retroperitoneal nodes. Index  retroaortic 9 mm node on 52/2 is increased from 7 mm on the prior  exam (when remeasured).   Ileocolic mesenteric nodes of up to 1.0 cm on 71/2, 1.2 cm on the  prior exam (when remeasured).   Reproductive: Hysterectomy.   Suspect left-sided hydrosalpinx, as evidenced by somewhat  cylindrical low-density 5.8 x 3.3 cm mass on 81/2. Similar.   Other: Presacral heterogeneous soft tissue density collection  measures 8.6 x 7.0 cm on 88/2 versus 7.4 x 6.6 cm when remeasured in  a similar fashion on the prior. The gas within has resolved.   Musculoskeletal: Degenerative changes of both hips. L4-5 trans  pedicle screw fixation.   IMPRESSION:  1. Presacral soft tissue density collection is somewhat more  confluent and mildly enlarged since 12/12/2023. Resolved gas within.  This could represent chronic infection and/or neoplasm. No drainable  fluido component.  2. Significantly increased left and similar to minimally increased  right hydroureteronephrosis, presumably secondary to the presacral  process. Bilateral ureteric stents in place.  3. Descending left abdominal wall colostomy with mildly increased  abdominal retroperitoneal and similar ileocolic mesenteric  adenopathy, infectious versus metastatic disease.  4.  Suspicion of left-sided hydrosalpinx, similar.  5. Incidental findings, including: Aortic Atherosclerosis  (ICD10-I70.0). Mild hepatic steatosis.   Assessment and Plan: AKI with bilateral hydronephrosis (baseline Cr 1.39) -Urology consulted -Foley placed in the ED per their recommendations -Continue hydration with LR 125 mL/h - Will plan to repeat labs.  Further recommendation if renal function does not improve we will plan to consult IR for Uni/bilateral PCN placement.  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  PET 03/06/2022 demonstrated markedly increased metabolic activity associated with soft tissue that was increased throughout the pelvis with irregular margins on previous CT about the posterior pelvis above the small amount of remaining rectum and anus.  The soft tissue obstructed the ureters and had an SUV 15.43.  The radiologist noted that these findings were highly suspicious for disease recurrence with ureteral obstruction and markedly bilateral hydroureteronephrosis she also had areas with increased metabolic activity and associated hypermetabolic lymph nodes in the left pelvis and a parasacral soft tissue mass at the level of the pelvic floor on the right. She did have the mass biopsied following this PET and it was negative for disease recurrence.  It has unfortunately increased in size since 11/2023 and may require another biopsy.  - start zosyn   - Consider consultation with IR for biopsy in the a.m.  Lactic acidosis, resolved - 3.3 on arrival to outside hospital  Leukocytosis  Normocytic anemia (Hgb 9.1 on admission) - the patient does have active, ongoing vaginal bleeding from her peritoneal-vaginal fistula.  - check iron , B12 & folate  Hypokalemia - s/p 40 meq K in ED -CTM  Chronic medical conditions History of rectal adenocarcinoma (2017) status postsurgical resection with ostomy creation Chronic obstructive  uropathy s/p b/l stent placement 2/2 hydronephrosis Hypertension-metoprolol  succinate Hyperlipidemia- continue home  statin  Hypothyroidism-continue home Synthroid  Seizure disorder-continue home Keppra  Complex chronic pain syndrome-continue home duloxetine , pregabalin , hydromorphone  peritoneal-vaginal fistula with acute on chronic vaginal bleeding and abdominopelvic pain   Diet: regular DVT ppx: held pending surgical planning  Electronically signed by: Keitha Deed, DO 03/22/2024 10:14 PM       [1] Current Facility-Administered Medications Ordered in Epic  Medication Dose Route Frequency Provider Last Rate Last Admin  . atorvastatin (LIPITOR) tablet 20 mg  20 mg oral At Bedtime Keitha Estefana Deed, DO      . [START ON 03/23/2024] DULoxetine  (CYMBALTA ) DR capsule 120 mg  120 mg oral Daily Alessandra Claire Carrillo, DO      . [START ON 03/23/2024] fesoterodine ER (TOVIAZ) tablet 4 mg  4 mg oral Daily Alessandra Claire Carrillo, DO      . HYDROmorphone  (DILAUDID ) tablet 4 mg  4 mg oral Q6H PRN Keitha Estefana Deed, DO      . hyoscyamine  (LEVSIN AMIEL) sublingual tablet 0.125 mg  0.125 mg sublingual TID PRN Keitha Estefana Deed, DO      . lactated ringer 's infusion  125 mL/hr intravenous Continuous Keitha Estefana Deed, DO 125 mL/hr at 03/22/24 2021 125 mL/hr at 03/22/24 2021  . levETIRAcetam  (KEPPRA ) tablet 500 mg  500 mg oral BID Keitha Estefana Deed, DO      . [START ON 03/23/2024] levothyroxine  (SYNTHROID ) tablet 50 mcg  50 mcg oral Daily Alessandra Claire Carrillo, DO      . [START ON 03/23/2024] metoprolol  succinate (TOPROL  XL) 24 hr tablet 100 mg  100 mg oral Daily Alessandra Estefana Deed, DO      . polyethylene glycol (GLYCOLAX ) packet 17 g  17 g oral Daily PRN Keitha Estefana Deed, DO      . pregabalin  (LYRICA ) capsule 225 mg  225 mg oral BID Alessandra Claire Carrillo, DO      . tamsulosin  (FLOMAX ) 24 hr capsule 0.4 mg  0.4 mg oral Q24H  Keitha Estefana Deed, DO      . traZODone  (DESYREL ) tablet 25 mg  25 mg oral At Bedtime Keitha Estefana Deed, DO       Meds Ordered in [facility]  Medication Sig Dispense Refill  . acetaminophen  (Tylenol  Extra Strength) 500 mg tablet Take 1,000 mg by mouth every 4 (four) hours as needed for mild pain (1-3), headaches or fever 100.4 F or GREATER.    . bisoprolol  (ZEBETA ) 5 mg tablet Take 5 mg by mouth daily.    . capsaicin -skin cleanser (QUTENZA ) 8 % topical kit Apply topically as needed.    . cholecalciferol  (VITAMIN D3) 125 mcg (5,000 unit) capsule Take 5,000 Units by mouth daily.    . coenzyme Q-10 200 mg capsule Take 200 mg by mouth daily.    . DULoxetine  (CYMBALTA ) 60 mg capsule Take 120 mg by mouth daily.    . esomeprazole  (NexIUM ) 40 mg DR capsule Take 40 mg by mouth 2 (two) times a day.    . famotidine  (PEPCID ) 20 mg tablet Take 20 mg by mouth daily as needed for heartburn.    . furosemide  (LASIX ) 20 mg tablet Take 20 mg by mouth 2 (two) times a day as needed (swelling).    . HYDROmorphone  (DILAUDID ) 4 mg tablet Take 4 mg by mouth every 6 (six) hours as needed for moderate pain (4-6).    . hyoscyamine  (LEVSIN /SL) 0.125 mg sublingual tablet Place 0.125 mg under the tongue 3 (three) times a day as needed (diarrhea).    . ibuprofen (MOTRIN) 200 mg  tablet Take 800 mg by mouth every 6 (six) hours as needed (pain).    . levETIRAcetam  (KEPPRA ) 250 mg tablet Take 500 mg by mouth 2 (two) times a day.    . levothyroxine  (SYNTHROID ) 50 mcg tablet Take 50 mcg by mouth daily.    . naloxone  (Narcan ) 4 mg/actuation spry nasal spray Administer 1 spray into affected nostril(s) as needed for opioid reversal.    . Nucynta ER 150 mg Tb12 12 hour tablet Take 150 mg by mouth every 12 (twelve) hours as needed.    . oxyBUTYnin  (DITROPAN  XL) 15 mg 24 hr tablet Take 1 tablet (15 mg total) by mouth daily. 90 tablet 3  . polyethylene glycol (MIRALAX ) 17 gram powd powder Take 17 g by mouth daily as needed  for constipation.    . pregabalin  (LYRICA ) 225 mg capsule Take 225 mg by mouth 2 (two) times a day.    . promethazine  (PHENERGAN ) 25 mg tablet Take 25 mg by mouth every 6 (six) hours as needed for nausea or vomiting.    . rosuvastatin  (CRESTOR ) 5 mg tablet Take 5 mg by mouth daily.    . tamsulosin  (FLOMAX ) 0.4 mg cap Take 1 capsule (0.4 mg total) by mouth daily. 90 capsule 4  . traZODone  (DESYREL ) 50 mg tablet Take 25 mg by mouth at bedtime.    [2] Allergies Allergen Reactions  . Lidocaine  Other (See Comments), Rash and Swelling    Eyes swell shut; includes all caine drugs except marcaine ., EMLA  cream OK though (?!)  Eyes swell shut; includes all caine drugs except marcaine .  EMLA  cream OK though (?!)  Eyes swell shut; includes all caine drugs except marcaine .  EMLA  cream OK though  Other Reaction(s): Unknown  Eyes swell shut; includes all caine drugs except marcaine . EMLA  cream OK though     Eyes swell shut; includes all caine drugs except marcaine . EMLA  cream OK though (?!)    Eyes swell shut; includes all caine drugs except marcaine ., EMLA  cream OK though (?!)  Eyes swell shut; includes all caine drugs except marcaine . EMLA  cream OK though (?!)  Eyes swell shut; includes all caine drugs except marcaine . EMLA  cream OK though  Eyes swell shut; includes all -caine drugs except marcaine .  EMLA  cream OK though  . Bupropion Other (See Comments)    Not effective per Pt.  Not effective per Pt.     Not effective per Pt.  Not effective  . Lidocaine  Hcl Swelling  . Metronidazole  GI Intolerance, Nausea Only, Other (See Comments) and Nausea And Vomiting    Other reaction(s): N/V, can not sleep  Other Reaction(s): N/V, can not sleep, Unknown  GI Intolerance and sleeplessness  . Oxycodone  Other (See Comments)    NIGHTMARES. (tolerates hydrocodone  or tramadol  better), NIGHTMARES. (tolerates hydrocodone  or tramadol  better), , Other reaction(s): nightmares  Other Reaction(s):  nightmares  NIGHTMARES. (tolerates hydrocodone  or tramadol  better)    NIGHTMARES. (tolerates hydrocodone  or tramadol  better) NIGHTMARES. (tolerates hydrocodone  or tramadol  better)  Other reaction(s): nightmares    NIGHTMARES. (tolerates hydrocodone  or tramadol  better), NIGHTMARES. (tolerates hydrocodone  or tramadol  better), , Other reaction(s): nightmares  NIGHTMARES. (tolerates hydrocodone  or tramadol  better)  . Penicillins Rash, Nausea Only and Other (See Comments)    Has patient had a PCN reaction causing immediate rash, facial/tongue/throat swelling, SOB or lightheadedness with hypotension: no, Has patient had a PCN reaction causing severe rash involving mucus membranes or skin necrosis: no, Has patient had a PCN reaction that required hospitalization no, Has patient  had a PCN reaction occurring within the last 10 years: no, If all of the above answers are NO, then may proceed with Cephalosporin use.  Has patient had a PCN reaction causing immediate rash, facial/tongue/throat swelling, SOB or lightheadedness with hypotension: no Has patient had a PCN reaction causing severe rash involving mucus membranes or skin necrosis: no Has patient had a PCN reaction that required hospitalization no Has patient had a PCN reaction occurring within the last 10 years: no If all of the above answers are NO, then may proceed with Cephalosporin use.    Has patient had a PCN reaction causing immediate rash, facial/tongue/throat swelling, SOB or lightheadedness with hypotension: no, Has patient had a PCN reaction causing severe rash involving mucus membranes or skin necrosis: no, Has patient had a PCN reaction that required hospitalization no, Has patient had a PCN reaction occurring within the last 10 years: no, If all of the above answers are NO, then may proceed with Cephalosporin use.  . Sulfa (Sulfonamide Antibiotics) Nausea And Vomiting, Other (See Comments), Rash and Dermatitis    Other reaction(s):  Rash, rash/vomiting, Unknown  Other Reaction(s): rash/vomiting  . Naltrexone-Bupropion     Other Reaction(s): nightmares, palpitations  . Pantoprazole  Other (See Comments)    Other Reaction(s): Other (See Comments)  Not effective per Pt.  not effective  . Adhesive Rash and Other (See Comments)    Blisters - can use paper tape  . Doxycycline  GI Intolerance, Nausea Only and Other (See Comments)    Other Reaction(s): Other (See Comments)  GI Intolerance  . Iron  GI Intolerance, Nausea Only and Other (See Comments)    Other Reaction(s): Other (See Comments)  GI Intolerance  . Metoprolol  Nausea Only and Palpitations  . Metoprolol  Succinate Palpitations  . Penicillin V Potassium Rash  *Some images could not be shown.

## 2024-03-22 NOTE — ED Notes (Signed)
 [facility] has been called, they have 2 trips before this patient and we are next in line..17:18, KLJ

## 2024-03-22 NOTE — ED Provider Notes (Signed)
 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  She has viewed the images.  Given that the obstruction is a little worse and that her kidney function is not improving despite treatment, she agrees to accept the patient ED to ED where they can exchange the stent.  Transfer process will be started.  6:58 PM Patient remains stable for transfer. [facility] is here to pick her up.   Freddi Hamilton, MD 03/22/24 [phone]

## 2024-03-22 NOTE — ED Notes (Signed)
 Patient Advocate observed patient lying in bed resting quietly.   Lindsay Little 03/22/24 2230

## 2024-03-22 NOTE — ED Notes (Signed)
 Ok per ED attending to draw 1 set of blood cultures from a new site prior to starting the next round of abx.

## 2024-03-22 NOTE — Progress Notes (Signed)
 PHARMACY - PHYSICIAN COMMUNICATION CRITICAL VALUE ALERT - BLOOD CULTURE IDENTIFICATION (BCID)  Lindsay Little is an 58 y.o. female who presented to [facility] on 03/21/2024 with a chief complaint of  Chief Complaint  Patient presents with   Post-op Problem     Assessment:  1/2 bottles showing Staph epi MecA positive  Name of physician (or Provider) Contacted: Dr. Trine  Current antibiotics: cefepime , Flagyl   Changes to prescribed antibiotics recommended:  Patient is on recommended antibiotics - No changes needed - Likely contaminant   Results for orders placed or performed during the hospital encounter of 03/21/24  Blood Culture ID Panel (Reflexed) (Collected: 03/21/2024  8:40 AM)  Result Value Ref Range   Enterococcus faecalis NOT DETECTED NOT DETECTED   Enterococcus Faecium NOT DETECTED NOT DETECTED   Listeria monocytogenes NOT DETECTED NOT DETECTED   Staphylococcus species DETECTED (A) NOT DETECTED   Staphylococcus aureus (BCID) NOT DETECTED NOT DETECTED   Staphylococcus epidermidis DETECTED (A) NOT DETECTED   Staphylococcus lugdunensis NOT DETECTED NOT DETECTED   Streptococcus species NOT DETECTED NOT DETECTED   Streptococcus agalactiae NOT DETECTED NOT DETECTED   Streptococcus pneumoniae NOT DETECTED NOT DETECTED   Streptococcus pyogenes NOT DETECTED NOT DETECTED   A.calcoaceticus-baumannii NOT DETECTED NOT DETECTED   Bacteroides fragilis NOT DETECTED NOT DETECTED   Enterobacterales NOT DETECTED NOT DETECTED   Enterobacter cloacae complex NOT DETECTED NOT DETECTED   Escherichia coli NOT DETECTED NOT DETECTED   Klebsiella aerogenes NOT DETECTED NOT DETECTED   Klebsiella oxytoca NOT DETECTED NOT DETECTED   Klebsiella pneumoniae NOT DETECTED NOT DETECTED   Proteus species NOT DETECTED NOT DETECTED   Salmonella species NOT DETECTED NOT DETECTED   Serratia marcescens NOT DETECTED NOT DETECTED   Haemophilus influenzae NOT DETECTED NOT DETECTED   Neisseria meningitidis  NOT DETECTED NOT DETECTED   Pseudomonas aeruginosa NOT DETECTED NOT DETECTED   Stenotrophomonas maltophilia NOT DETECTED NOT DETECTED   Candida albicans NOT DETECTED NOT DETECTED   Candida auris NOT DETECTED NOT DETECTED   Candida glabrata NOT DETECTED NOT DETECTED   Candida krusei NOT DETECTED NOT DETECTED   Candida parapsilosis NOT DETECTED NOT DETECTED   Candida tropicalis NOT DETECTED NOT DETECTED   Cryptococcus neoformans/gattii NOT DETECTED NOT DETECTED   Methicillin resistance mecA/C DETECTED (A) NOT DETECTED    Dolphus Roller, PharmD, BCPS 03/22/2024 6:45 AM

## 2024-03-22 NOTE — ED Provider Notes (Addendum)
 Patient rounded on today and resting comfortably in her bed.  Receiving IV antibiotics.  Contacted [facility] and patient still does not have a bed assigned.  General surgery note by Dr. Curvin reviewed and no surgical issues at this time.  Patient only receiving IV antibiotics.  Patient has been in the ED for over 24 hours.  Requires hospital mission at this time.  Will contact hospitalist team   11:20 AM Discussed case with [facility] hospitalist who is deferring admission at this time.  Request that I speak to interventional radiology to see if they can offer the patient anything.  I did speak with Dr. Elihu who reviewed the patient's medical record and imaging.  States that they have nothing to offer the patient at this time.  I will recontact the urology service at [facility] due to concern for patient's worsening renal status given her hydronephrosis  12:06 PM Spoke with urology attending over at [facility].  Scans will be pushed to her and she will review them for possible direct transfer to the ED for them to evaluate the patient.   Dasie Faden, MD 03/22/24 1020    Dasie Faden, MD 03/22/24 1120    Dasie Faden, MD 03/22/24 [phone]

## 2024-03-22 NOTE — ED Notes (Signed)
 Patient sleeping at this time.

## 2024-03-22 NOTE — Progress Notes (Signed)
 PROGRESS NOTE    Lindsay Little  FMW:[id number] DOB: 1965/10/05 DOA: 03/21/2024 PCP: Teresa Channel, MD  Outpatient Specialists:     Brief Narrative:  As per prior documentation done by Dr. Nilda Fendt: This is a 58 year old female with history of rectal cancer, status post surgical resection and ostomy creation, chronic obstructive uropathy with bilateral ureteral stents due to hydronephrosis, hypertension, hyperlipidemia, hypothyroidism, seizure disorder, chronic pain syndrome, peritoneal vaginal fistula with recurrent bleeding, who comes into the hospital with lower abdominal pain as well as worsening vaginal bleeding over the last day.  She was recently hospitalized just a week ago at [facility] and underwent direct access bleomycin sclerotherapy by IR, I presume for her vaginal bleeding/fistula.  She tells me that the procedure was not really successful since she has had continuous bleeding, but now pain is worse.  She denies any fever or chills, denies any nausea or vomiting.  She has no chest pain or shortness of breath.   In the ED here she has a low-grade temp of 99.2, she is hypertensive and satting well on room air.  Blood work shows a creatinine of 1.48, white count 12.1, initial lactic acid 3.3 and improved to 0.7 after fluids.  Urinalysis shows many bacteria and pyuria. CT abdomen pelvis showed her known soft tissue fluid collection is slightly enlarged since May 2025, suggesting chronic infection versus neoplasm, no drainable fluid.  She has had slightly worsening right hydroureteronephrosis presumed due to her presacral process, stents are in place.  She was given antibiotics, EDP discussed with urology at [facility], they agree that she needs to be admitted there, she is on a wait list and we are asked to help comanage her medical problems while waiting  03/22/2024: Patient is awaiting transfer ED to ED.  Discussed with Dr. Curtistine Dawn extensively.  Blood cultures likely contaminant.   Will repeat blood cultures.  Potassium of 3.3, will replete.   Assessment & Plan:   Active Problems:   * No active hospital problems. *  Positive blood culture: - Likely contaminant. - Repeat blood cultures.  Hypokalemia: - Potassium of 3.3. - Continue to monitor and replete.  Intra-abdominal fluid collection, peritoneal vaginal fistula, vaginal bleeding -she is status post recent admission at [facility], status post IR guided direct access bleomycin sclerotherapy, did not seem to be successful given recurrent bleeding - There is concern about this fluid being infected, has been started on antibiotics, continue.  Cultures are sent - Continue pain control with her home medications   Active problems Possible UTI-on antibiotics, urine cultures are pending   Essential hypertension-resume home medications   Bilateral hydronephrosis status post stents -slightly worsening hydronephrosis in the setting of pelvic fluid collection, she will be seen by urology at [facility]   Chronic pain-continue home Dilaudid    Hypothyroidism-continue Synthroid    History of rectal cancer -outpatient follow-up with oncology   Obesity, morbid-BMI over 40.  She would benefit from weight loss   Acute kidney injury-in the setting of hydronephrosis, slightly worsening fluid collection.  Most recent creatinine on discharge from [facility] was 0.89 in August 2025   History of seizures-continue Keppra    Depression-continue home medications     Antimicrobials:  Cefepime  IV Flagyl    Subjective: No new complaints.  Objective: Vitals:   03/22/24 1100 03/22/24 1417 03/22/24 1509 03/22/24 1512  BP: (!) 122/92 130/74 128/69   Pulse: 84 73  69  Resp: 16 15    Temp:  98.6 F (37 C)    TempSrc:  Oral    SpO2: 99% 100%  100%  Weight:      Height:        Intake/Output Summary (Last 24 hours) at 03/22/2024 1614 Last data filed at 03/22/2024 1051 Gross per 24 hour  Intake 500 ml  Output 800 ml  Net -300 ml    Filed Weights   03/21/24 0841  Weight: 131.5 kg    Examination:  General exam: Appears calm and comfortable.  Patient is morbidly obese. Respiratory system: Clear to auscultation. Respiratory effort normal. Cardiovascular system: S1 & S2 heard Gastrointestinal system: Abdomen is obese, soft and nontender.   Central nervous system: Alert and oriented.    Data Reviewed: I have personally reviewed following labs and imaging studies  CBC: Recent Labs  Lab 03/21/24 0840 03/22/24 0502  WBC 12.1* 11.4*  NEUTROABS 9.3*  --   HGB 11.3* 10.1*  HCT 37.9 34.3*  MCV 78.3* 78.5*  PLT 544* 484*   Basic Metabolic Panel: Recent Labs  Lab 03/21/24 0840 03/22/24 0502  NA 141 145  K 3.5 3.3*  CL 101 106  CO2 25 22  GLUCOSE 136* 99  BUN 19 18  CREATININE 1.48* 1.54*  CALCIUM  10.3 10.3   GFR: Estimated Creatinine Clearance: 60.1 mL/min (A) (by C-G formula based on SCr of 1.54 mg/dL (H)). Liver Function Tests: Recent Labs  Lab 03/21/24 0840  AST 12*  ALT 6  ALKPHOS 107  BILITOT 1.1  PROT 8.7*  ALBUMIN 3.5   No results for input(s): LIPASE, AMYLASE in the last 168 hours. No results for input(s): AMMONIA in the last 168 hours. Coagulation Profile: Recent Labs  Lab 03/21/24 0840  INR 1.1   Cardiac Enzymes: No results for input(s): CKTOTAL, CKMB, CKMBINDEX, TROPONINI in the last 168 hours. BNP (last 3 results) No results for input(s): PROBNP in the last 8760 hours. HbA1C: No results for input(s): HGBA1C in the last 72 hours. CBG: No results for input(s): GLUCAP in the last 168 hours. Lipid Profile: No results for input(s): CHOL, HDL, LDLCALC, TRIG, CHOLHDL, LDLDIRECT in the last 72 hours. Thyroid  Function Tests: No results for input(s): TSH, T4TOTAL, FREET4, T3FREE, THYROIDAB in the last 72 hours. Anemia Panel: No results for input(s): VITAMINB12, FOLATE, FERRITIN, TIBC, IRON , RETICCTPCT in the last 72  hours. Urine analysis:    Component Value Date/Time   COLORURINE AMBER (A) 03/21/2024 1424   APPEARANCEUR CLOUDY (A) 03/21/2024 1424   LABSPEC 1.011 03/21/2024 1424   LABSPEC 1.010 02/26/2016 1604   PHURINE 8.0 03/21/2024 1424   GLUCOSEU NEGATIVE 03/21/2024 1424   GLUCOSEU Negative 02/26/2016 1604   HGBUR MODERATE (A) 03/21/2024 1424   BILIRUBINUR NEGATIVE 03/21/2024 1424   BILIRUBINUR Negative 02/26/2016 1604   KETONESUR NEGATIVE 03/21/2024 1424   PROTEINUR 100 (A) 03/21/2024 1424   UROBILINOGEN 0.2 02/26/2016 1604   NITRITE POSITIVE (A) 03/21/2024 1424   LEUKOCYTESUR MODERATE (A) 03/21/2024 1424   LEUKOCYTESUR Trace 02/26/2016 1604   Sepsis Labs: @LABRCNTIP (procalcitonin:4,lacticidven:4)  ) Recent Results (from the past 240 hours)  Culture, blood (Routine x 2)     Status: None (Preliminary result)   Collection Time: 03/21/24  8:40 AM   Specimen: BLOOD  Result Value Ref Range Status   Specimen Description   Final    BLOOD RIGHT ANTECUBITAL Performed at [facility], [address], [place], [place] [address]    Special Requests   Final    BOTTLES DRAWN AEROBIC AND ANAEROBIC Blood Culture adequate volume Performed at [facility], [address] [place], [place] [address]    Culture  Setup Time   Final    GRAM POSITIVE COCCI AEROBIC BOTTLE ONLY CRITICAL RESULT CALLED TO, READ BACK BY AND VERIFIED WITH: PHARMD N GLOGOVAC 03/22/2024 @ 0622 BY AB Performed at [facility], [address] [place], [place] [address]    Culture GRAM POSITIVE COCCI  Final   Report Status PENDING  Incomplete  Blood Culture ID Panel (Reflexed)     Status: Abnormal   Collection Time: 03/21/24  8:40 AM  Result Value Ref Range Status   Enterococcus faecalis NOT DETECTED NOT DETECTED Final   Enterococcus Faecium NOT DETECTED NOT DETECTED Final   Listeria monocytogenes NOT DETECTED NOT DETECTED Final   Staphylococcus species DETECTED (A) NOT DETECTED  Final    Comment: CRITICAL RESULT CALLED TO, READ BACK BY AND VERIFIED WITH: PHARMD N GLOGOVAC 03/22/2024 @ 0622 BY AB    Staphylococcus aureus (BCID) NOT DETECTED NOT DETECTED Final   Staphylococcus epidermidis DETECTED (A) NOT DETECTED Final    Comment: Methicillin (oxacillin) resistant coagulase negative staphylococcus. Possible blood culture contaminant (unless isolated from more than one blood culture draw or clinical case suggests pathogenicity). No antibiotic treatment is indicated for blood  culture contaminants. CRITICAL RESULT CALLED TO, READ BACK BY AND VERIFIED WITH: PHARMD N GLOGOVAC 03/22/2024 @ 0622 BY AB    Staphylococcus lugdunensis NOT DETECTED NOT DETECTED Final   Streptococcus species NOT DETECTED NOT DETECTED Final   Streptococcus agalactiae NOT DETECTED NOT DETECTED Final   Streptococcus pneumoniae NOT DETECTED NOT DETECTED Final   Streptococcus pyogenes NOT DETECTED NOT DETECTED Final   A.calcoaceticus-baumannii NOT DETECTED NOT DETECTED Final   Bacteroides fragilis NOT DETECTED NOT DETECTED Final   Enterobacterales NOT DETECTED NOT DETECTED Final   Enterobacter cloacae complex NOT DETECTED NOT DETECTED Final   Escherichia coli NOT DETECTED NOT DETECTED Final   Klebsiella aerogenes NOT DETECTED NOT DETECTED Final   Klebsiella oxytoca NOT DETECTED NOT DETECTED Final   Klebsiella pneumoniae NOT DETECTED NOT DETECTED Final   Proteus species NOT DETECTED NOT DETECTED Final   Salmonella species NOT DETECTED NOT DETECTED Final   Serratia marcescens NOT DETECTED NOT DETECTED Final   Haemophilus influenzae NOT DETECTED NOT DETECTED Final   Neisseria meningitidis NOT DETECTED NOT DETECTED Final   Pseudomonas aeruginosa NOT DETECTED NOT DETECTED Final   Stenotrophomonas maltophilia NOT DETECTED NOT DETECTED Final   Candida albicans NOT DETECTED NOT DETECTED Final   Candida auris NOT DETECTED NOT DETECTED Final   Candida glabrata NOT DETECTED NOT DETECTED Final   Candida  krusei NOT DETECTED NOT DETECTED Final   Candida parapsilosis NOT DETECTED NOT DETECTED Final   Candida tropicalis NOT DETECTED NOT DETECTED Final   Cryptococcus neoformans/gattii NOT DETECTED NOT DETECTED Final   Methicillin resistance mecA/C DETECTED (A) NOT DETECTED Final    Comment: CRITICAL RESULT CALLED TO, READ BACK BY AND VERIFIED WITH: PHARMD N GLOGOVAC 03/22/2024 @ 0622 BY AB Performed at [facility], [address] [place], [place] [address]   SARS Coronavirus 2 by RT PCR (hospital order, performed in [facility] hospital lab) *cepheid single result test* Anterior Nasal Swab     Status: None   Collection Time: 03/21/24  9:05 AM   Specimen: Anterior Nasal Swab  Result Value Ref Range Status   SARS Coronavirus 2 by RT PCR NEGATIVE NEGATIVE Final    Comment: (NOTE) SARS-CoV-2 target nucleic acids are NOT DETECTED.  The SARS-CoV-2 RNA  is generally detectable in upper and lower respiratory specimens during the acute phase of infection. The lowest concentration of SARS-CoV-2 viral copies this assay can detect is 250 copies / mL. A negative result does not preclude SARS-CoV-2 infection and should not be used as the sole basis for treatment or other patient management decisions.  A negative result may occur with improper specimen collection / handling, submission of specimen other than nasopharyngeal swab, presence of viral mutation(s) within the areas targeted by this assay, and inadequate number of viral copies (<250 copies / mL). A negative result must be combined with clinical observations, patient history, and epidemiological information.  Fact Sheet for Patients:   RoadLapTop.co.za  Fact Sheet for Healthcare Providers: http://kim-miller.com/  This test is not yet approved or  cleared by the [place]  FDA and has been authorized for detection and/or diagnosis of SARS-CoV-2 by FDA under an Emergency Use Authorization  (EUA).  This EUA will remain in effect (meaning this test can be used) for the duration of the COVID-19 declaration under Section 564(b)(1) of the Act, 21 U.S.C. section 360bbb-3(b)(1), unless the authorization is terminated or revoked sooner.  Performed at [facility], [address] [place], [place] [address]          Radiology Studies: CT ABDOMEN PELVIS W CONTRAST Result Date: 03/21/2024 CLINICAL DATA:  Vaginal bleeding with history of fistula. Prior history includes rectal adenocarcinoma. Crohn's disease. Colostomy. * Tracking Code: BO * EXAM: CT ABDOMEN AND PELVIS WITH CONTRAST TECHNIQUE: Multidetector CT imaging of the abdomen and pelvis was performed using the standard protocol following bolus administration of intravenous contrast. RADIATION DOSE REDUCTION: This exam was performed according to the departmental dose-optimization program which includes automated exposure control, adjustment of the mA and/or kV according to patient size and/or use of iterative reconstruction technique. CONTRAST:  80mL OMNIPAQUE  IOHEXOL  300 MG/ML  SOLN COMPARISON:  12/12/2023 FINDINGS: Lower chest: Bibasilar scarring. Normal heart size without pericardial or pleural effusion. Hepatobiliary: Mild hepatic steatosis. Normal gallbladder, without biliary ductal dilatation. Pancreas: Normal, without mass or ductal dilatation. Spleen: Normal in size, without focal abnormality. Adrenals/Urinary Tract: Normal adrenal glands. Bilateral ureteric stents which originate in the renal pelves and terminate in the urinary bladder. Moderate bilateral hydronephrosis. Significantly increased on the left. Similar to minimally increased on the right. Decreased contrast excretion from both kidneys on delayed images. Bladder otherwise normal. Stomach/Bowel: Normal stomach, without wall thickening. Left abdominal wall colostomy.  Normal terminal ileum. No bowel obstruction.  Pelvic enterotomy. Vascular/Lymphatic:  Aortic atherosclerosis. Persistent increased number, less so size of abdominal retroperitoneal nodes. Index retroaortic 9 mm node on 52/2 is increased from 7 mm on the prior exam (when remeasured). Ileocolic mesenteric nodes of up to 1.0 cm on 71/2, 1.2 cm on the prior exam (when remeasured). Reproductive: Hysterectomy. Suspect left-sided hydrosalpinx, as evidenced by somewhat cylindrical low-density 5.8 x 3.3 cm mass on 81/2. Similar. Other: Presacral heterogeneous soft tissue density collection measures 8.6 x 7.0 cm on 88/2 versus 7.4 x 6.6 cm when remeasured in a similar fashion on the prior. The gas within has resolved. Musculoskeletal: Degenerative changes of both hips. L4-5 trans pedicle screw fixation. IMPRESSION: 1. Presacral soft tissue density collection is somewhat more confluent and mildly enlarged since 12/12/2023. Resolved gas within. This could represent chronic infection and/or neoplasm. No drainable fluido component. 2. Significantly increased left and similar to minimally increased right hydroureteronephrosis, presumably secondary to the presacral process. Bilateral ureteric stents in place. 3. Descending left abdominal wall colostomy  with mildly increased abdominal retroperitoneal and similar ileocolic mesenteric adenopathy, infectious versus metastatic disease. 4. Suspicion of left-sided hydrosalpinx, similar. 5. Incidental findings, including: Aortic Atherosclerosis (ICD10-I70.0). Mild hepatic steatosis. Electronically Signed   By: Rockey Kilts M.D.   On: 03/21/2024 12:37        Scheduled Meds:  bisoprolol   5 mg Oral Daily   DULoxetine   120 mg Oral QHS   levETIRAcetam   500 mg Oral BID   levothyroxine   50 mcg Oral QAC breakfast   oxybutynin   15 mg Oral QHS   pantoprazole   40 mg Oral Daily   potassium chloride   40 mEq Oral Once   pregabalin   225 mg Oral BID   rosuvastatin   5 mg Oral QPM   tamsulosin   0.4 mg Oral Daily   Continuous Infusions:  ceFEPime  (MAXIPIME ) IV Stopped  (03/22/24 1022)   metronidazole  Stopped (03/22/24 1051)     LOS: 0 days    Time spent: 35 minutes.    Leatrice Chapel, MD  [facility] Pager #: [phone] 7PM-7AM contact night coverage as above

## 2024-03-24 DIAGNOSIS — C20 Malignant neoplasm of rectum: Secondary | ICD-10-CM | POA: Diagnosis not present

## 2024-03-24 DIAGNOSIS — R1909 Other intra-abdominal and pelvic swelling, mass and lump: Secondary | ICD-10-CM | POA: Diagnosis not present

## 2024-03-24 DIAGNOSIS — R188 Other ascites: Secondary | ICD-10-CM | POA: Diagnosis not present

## 2024-03-24 LAB — CULTURE, BLOOD (ROUTINE X 2): Special Requests: ADEQUATE

## 2024-03-25 DIAGNOSIS — C20 Malignant neoplasm of rectum: Secondary | ICD-10-CM | POA: Diagnosis not present

## 2024-03-26 DIAGNOSIS — C20 Malignant neoplasm of rectum: Secondary | ICD-10-CM | POA: Diagnosis not present

## 2024-03-27 LAB — CULTURE, BLOOD (ROUTINE X 2): Culture: NO GROWTH

## 2024-03-29 DIAGNOSIS — C20 Malignant neoplasm of rectum: Secondary | ICD-10-CM | POA: Diagnosis not present

## 2024-03-30 DIAGNOSIS — R1900 Intra-abdominal and pelvic swelling, mass and lump, unspecified site: Secondary | ICD-10-CM | POA: Diagnosis not present

## 2024-03-30 DIAGNOSIS — N179 Acute kidney failure, unspecified: Secondary | ICD-10-CM | POA: Diagnosis not present

## 2024-03-31 DIAGNOSIS — N179 Acute kidney failure, unspecified: Secondary | ICD-10-CM | POA: Diagnosis not present

## 2024-03-31 DIAGNOSIS — C20 Malignant neoplasm of rectum: Secondary | ICD-10-CM | POA: Diagnosis not present

## 2024-03-31 DIAGNOSIS — R1900 Intra-abdominal and pelvic swelling, mass and lump, unspecified site: Secondary | ICD-10-CM | POA: Diagnosis not present

## 2024-04-02 DIAGNOSIS — R1900 Intra-abdominal and pelvic swelling, mass and lump, unspecified site: Secondary | ICD-10-CM | POA: Diagnosis not present

## 2024-04-02 DIAGNOSIS — N179 Acute kidney failure, unspecified: Secondary | ICD-10-CM | POA: Diagnosis not present

## 2024-04-03 DIAGNOSIS — R1909 Other intra-abdominal and pelvic swelling, mass and lump: Secondary | ICD-10-CM | POA: Diagnosis not present

## 2024-04-03 DIAGNOSIS — C20 Malignant neoplasm of rectum: Secondary | ICD-10-CM | POA: Diagnosis not present

## 2024-04-05 DIAGNOSIS — C20 Malignant neoplasm of rectum: Secondary | ICD-10-CM | POA: Diagnosis not present

## 2024-04-05 DIAGNOSIS — N179 Acute kidney failure, unspecified: Secondary | ICD-10-CM | POA: Diagnosis not present

## 2024-04-05 DIAGNOSIS — R1900 Intra-abdominal and pelvic swelling, mass and lump, unspecified site: Secondary | ICD-10-CM | POA: Diagnosis not present

## 2024-04-30 ENCOUNTER — Other Ambulatory Visit: Payer: Self-pay

## 2024-04-30 ENCOUNTER — Emergency Department (HOSPITAL_COMMUNITY)

## 2024-04-30 ENCOUNTER — Encounter (HOSPITAL_COMMUNITY): Payer: Self-pay | Admitting: Emergency Medicine

## 2024-04-30 ENCOUNTER — Inpatient Hospital Stay (HOSPITAL_COMMUNITY)
Admission: EM | Admit: 2024-04-30 | Discharge: 2024-05-03 | DRG: 854 | Disposition: A | Discharge status: Home / Self Care | Attending: Internal Medicine | Admitting: Internal Medicine

## 2024-04-30 DIAGNOSIS — K219 Gastro-esophageal reflux disease without esophagitis: Secondary | ICD-10-CM | POA: Diagnosis present

## 2024-04-30 DIAGNOSIS — Z6837 Body mass index (BMI) 37.0-37.9, adult: Secondary | ICD-10-CM

## 2024-04-30 DIAGNOSIS — R197 Diarrhea, unspecified: Secondary | ICD-10-CM | POA: Diagnosis not present

## 2024-04-30 DIAGNOSIS — D631 Anemia in chronic kidney disease: Secondary | ICD-10-CM | POA: Diagnosis not present

## 2024-04-30 DIAGNOSIS — Z933 Colostomy status: Secondary | ICD-10-CM

## 2024-04-30 DIAGNOSIS — I129 Hypertensive chronic kidney disease with stage 1 through stage 4 chronic kidney disease, or unspecified chronic kidney disease: Secondary | ICD-10-CM | POA: Diagnosis not present

## 2024-04-30 DIAGNOSIS — E039 Hypothyroidism, unspecified: Secondary | ICD-10-CM | POA: Diagnosis present

## 2024-04-30 DIAGNOSIS — Z87891 Personal history of nicotine dependence: Secondary | ICD-10-CM

## 2024-04-30 DIAGNOSIS — N179 Acute kidney failure, unspecified: Secondary | ICD-10-CM | POA: Diagnosis present

## 2024-04-30 DIAGNOSIS — I10 Essential (primary) hypertension: Secondary | ICD-10-CM | POA: Diagnosis present

## 2024-04-30 DIAGNOSIS — Z800 Family history of malignant neoplasm of digestive organs: Secondary | ICD-10-CM

## 2024-04-30 DIAGNOSIS — R112 Nausea with vomiting, unspecified: Secondary | ICD-10-CM | POA: Diagnosis present

## 2024-04-30 DIAGNOSIS — N3000 Acute cystitis without hematuria: Secondary | ICD-10-CM | POA: Diagnosis present

## 2024-04-30 DIAGNOSIS — Z9049 Acquired absence of other specified parts of digestive tract: Secondary | ICD-10-CM

## 2024-04-30 DIAGNOSIS — N136 Pyonephrosis: Secondary | ICD-10-CM | POA: Diagnosis not present

## 2024-04-30 DIAGNOSIS — R590 Localized enlarged lymph nodes: Secondary | ICD-10-CM | POA: Diagnosis not present

## 2024-04-30 DIAGNOSIS — E7800 Pure hypercholesterolemia, unspecified: Secondary | ICD-10-CM | POA: Diagnosis not present

## 2024-04-30 DIAGNOSIS — Z83438 Family history of other disorder of lipoprotein metabolism and other lipidemia: Secondary | ICD-10-CM

## 2024-04-30 DIAGNOSIS — Z743 Need for continuous supervision: Secondary | ICD-10-CM | POA: Diagnosis not present

## 2024-04-30 DIAGNOSIS — R739 Hyperglycemia, unspecified: Secondary | ICD-10-CM | POA: Diagnosis present

## 2024-04-30 DIAGNOSIS — N1330 Unspecified hydronephrosis: Secondary | ICD-10-CM | POA: Diagnosis not present

## 2024-04-30 DIAGNOSIS — Z880 Allergy status to penicillin: Secondary | ICD-10-CM

## 2024-04-30 DIAGNOSIS — Z803 Family history of malignant neoplasm of breast: Secondary | ICD-10-CM

## 2024-04-30 DIAGNOSIS — Z7901 Long term (current) use of anticoagulants: Secondary | ICD-10-CM

## 2024-04-30 DIAGNOSIS — Z808 Family history of malignant neoplasm of other organs or systems: Secondary | ICD-10-CM

## 2024-04-30 DIAGNOSIS — G40909 Epilepsy, unspecified, not intractable, without status epilepticus: Secondary | ICD-10-CM | POA: Diagnosis present

## 2024-04-30 DIAGNOSIS — R109 Unspecified abdominal pain: Secondary | ICD-10-CM | POA: Diagnosis not present

## 2024-04-30 DIAGNOSIS — Z884 Allergy status to anesthetic agent status: Secondary | ICD-10-CM

## 2024-04-30 DIAGNOSIS — Z833 Family history of diabetes mellitus: Secondary | ICD-10-CM | POA: Diagnosis not present

## 2024-04-30 DIAGNOSIS — R000 Tachycardia, unspecified: Secondary | ICD-10-CM | POA: Diagnosis not present

## 2024-04-30 DIAGNOSIS — N390 Urinary tract infection, site not specified: Secondary | ICD-10-CM | POA: Diagnosis present

## 2024-04-30 DIAGNOSIS — G894 Chronic pain syndrome: Secondary | ICD-10-CM | POA: Diagnosis present

## 2024-04-30 DIAGNOSIS — Z90721 Acquired absence of ovaries, unilateral: Secondary | ICD-10-CM

## 2024-04-30 DIAGNOSIS — Z882 Allergy status to sulfonamides status: Secondary | ICD-10-CM

## 2024-04-30 DIAGNOSIS — Z823 Family history of stroke: Secondary | ICD-10-CM

## 2024-04-30 DIAGNOSIS — E785 Hyperlipidemia, unspecified: Secondary | ICD-10-CM | POA: Diagnosis present

## 2024-04-30 DIAGNOSIS — E66812 Obesity, class 2: Secondary | ICD-10-CM | POA: Diagnosis present

## 2024-04-30 DIAGNOSIS — E872 Acidosis, unspecified: Secondary | ICD-10-CM | POA: Diagnosis not present

## 2024-04-30 DIAGNOSIS — R6520 Severe sepsis without septic shock: Secondary | ICD-10-CM | POA: Diagnosis present

## 2024-04-30 DIAGNOSIS — Z85048 Personal history of other malignant neoplasm of rectum, rectosigmoid junction, and anus: Secondary | ICD-10-CM

## 2024-04-30 DIAGNOSIS — Z79899 Other long term (current) drug therapy: Secondary | ICD-10-CM

## 2024-04-30 DIAGNOSIS — Z923 Personal history of irradiation: Secondary | ICD-10-CM | POA: Diagnosis not present

## 2024-04-30 DIAGNOSIS — A419 Sepsis, unspecified organism: Principal | ICD-10-CM | POA: Diagnosis present

## 2024-04-30 DIAGNOSIS — C20 Malignant neoplasm of rectum: Secondary | ICD-10-CM | POA: Diagnosis present

## 2024-04-30 DIAGNOSIS — Z8249 Family history of ischemic heart disease and other diseases of the circulatory system: Secondary | ICD-10-CM

## 2024-04-30 DIAGNOSIS — Z9221 Personal history of antineoplastic chemotherapy: Secondary | ICD-10-CM

## 2024-04-30 DIAGNOSIS — N12 Tubulo-interstitial nephritis, not specified as acute or chronic: Principal | ICD-10-CM

## 2024-04-30 DIAGNOSIS — Z883 Allergy status to other anti-infective agents status: Secondary | ICD-10-CM

## 2024-04-30 DIAGNOSIS — N3289 Other specified disorders of bladder: Secondary | ICD-10-CM | POA: Diagnosis present

## 2024-04-30 DIAGNOSIS — Z888 Allergy status to other drugs, medicaments and biological substances status: Secondary | ICD-10-CM

## 2024-04-30 DIAGNOSIS — D509 Iron deficiency anemia, unspecified: Secondary | ICD-10-CM | POA: Diagnosis present

## 2024-04-30 DIAGNOSIS — Z885 Allergy status to narcotic agent status: Secondary | ICD-10-CM

## 2024-04-30 DIAGNOSIS — R404 Transient alteration of awareness: Secondary | ICD-10-CM | POA: Diagnosis not present

## 2024-04-30 DIAGNOSIS — Z79890 Hormone replacement therapy (postmenopausal): Secondary | ICD-10-CM

## 2024-04-30 DIAGNOSIS — N182 Chronic kidney disease, stage 2 (mild): Secondary | ICD-10-CM | POA: Diagnosis present

## 2024-04-30 DIAGNOSIS — D638 Anemia in other chronic diseases classified elsewhere: Secondary | ICD-10-CM | POA: Diagnosis present

## 2024-04-30 DIAGNOSIS — Z90710 Acquired absence of both cervix and uterus: Secondary | ICD-10-CM

## 2024-04-30 DIAGNOSIS — Z881 Allergy status to other antibiotic agents status: Secondary | ICD-10-CM

## 2024-04-30 LAB — COMPREHENSIVE METABOLIC PANEL WITH GFR
ALT: 7 U/L (ref 0–44)
AST: 14 U/L — ABNORMAL LOW (ref 15–41)
Albumin: 4.3 g/dL (ref 3.5–5.0)
Alkaline Phosphatase: 100 U/L (ref 38–126)
Anion gap: 17 — ABNORMAL HIGH (ref 5–15)
BUN: 17 mg/dL (ref 6–20)
CO2: 21 mmol/L — ABNORMAL LOW (ref 22–32)
Calcium: 11.1 mg/dL — ABNORMAL HIGH (ref 8.9–10.3)
Chloride: 101 mmol/L (ref 98–111)
Creatinine, Ser: 1.34 mg/dL — ABNORMAL HIGH (ref 0.44–1.00)
GFR, Estimated: 46 mL/min — ABNORMAL LOW (ref >60)
Glucose, Bld: 144 mg/dL — ABNORMAL HIGH (ref 70–99)
Potassium: 4 mmol/L (ref 3.5–5.1)
Sodium: 138 mmol/L (ref 135–145)
Total Bilirubin: 0.5 mg/dL (ref 0.0–1.2)
Total Protein: 9.7 g/dL — ABNORMAL HIGH (ref 6.5–8.1)

## 2024-04-30 LAB — CBC WITH DIFFERENTIAL/PLATELET
Abs Immature Granulocytes: 0.15 K/uL — ABNORMAL HIGH (ref 0.00–0.07)
Basophils Absolute: 0.1 K/uL (ref 0.0–0.1)
Basophils Relative: 0 %
Eosinophils Absolute: 0 K/uL (ref 0.0–0.5)
Eosinophils Relative: 0 %
HCT: 39.5 % (ref 36.0–46.0)
Hemoglobin: 12 g/dL (ref 12.0–15.0)
Immature Granulocytes: 1 %
Lymphocytes Relative: 9 %
Lymphs Abs: 2 K/uL (ref 0.7–4.0)
MCH: 24.5 pg — ABNORMAL LOW (ref 26.0–34.0)
MCHC: 30.4 g/dL (ref 30.0–36.0)
MCV: 80.8 fL (ref 80.0–100.0)
Monocytes Absolute: 0.7 K/uL (ref 0.1–1.0)
Monocytes Relative: 3 %
Neutro Abs: 18.9 K/uL — ABNORMAL HIGH (ref 1.7–7.7)
Neutrophils Relative %: 87 %
Platelets: 528 K/uL — ABNORMAL HIGH (ref 150–400)
RBC: 4.89 MIL/uL (ref 3.87–5.11)
RDW: 18.2 % — ABNORMAL HIGH (ref 11.5–15.5)
WBC: 21.8 K/uL — ABNORMAL HIGH (ref 4.0–10.5)
nRBC: 0 % (ref 0.0–0.2)

## 2024-04-30 LAB — URINALYSIS, W/ REFLEX TO CULTURE (INFECTION SUSPECTED)
Bilirubin Urine: NEGATIVE
Glucose, UA: NEGATIVE mg/dL
Ketones, ur: NEGATIVE mg/dL
Nitrite: NEGATIVE
Protein, ur: >=300 mg/dL — AB
RBC / HPF: >50 RBC/hpf (ref 0–5)
Specific Gravity, Urine: 1.016 (ref 1.005–1.030)
WBC, UA: >50 WBC/hpf (ref 0–5)
pH: 6 (ref 5.0–8.0)

## 2024-04-30 LAB — PROTIME-INR
INR: 1 (ref 0.8–1.2)
Prothrombin Time: 13.4 s (ref 11.4–15.2)

## 2024-04-30 LAB — I-STAT CG4 LACTIC ACID, ED
Lactic Acid, Venous: 2.3 mmol/L (ref 0.5–1.9)
Lactic Acid, Venous: 2.2 mmol/L (ref 0.5–1.9)

## 2024-04-30 LAB — TROPONIN T, HIGH SENSITIVITY: Troponin T High Sensitivity: 15 ng/L (ref 0–19)

## 2024-04-30 MED ORDER — SODIUM CHLORIDE 0.9 % IV SOLN
2.0000 g | Freq: Once | INTRAVENOUS | Status: AC
  Administered 2024-04-30: 2 g via INTRAVENOUS
  Filled 2024-04-30: qty 12.5

## 2024-04-30 MED ORDER — LACTATED RINGERS IV BOLUS (SEPSIS)
1000.0000 mL | Freq: Once | INTRAVENOUS | Status: AC
  Administered 2024-04-30: 1000 mL via INTRAVENOUS

## 2024-04-30 MED ORDER — MORPHINE SULFATE (PF) 4 MG/ML IV SOLN
4.0000 mg | Freq: Once | INTRAVENOUS | Status: AC
  Administered 2024-04-30: 4 mg via INTRAVENOUS
  Filled 2024-04-30: qty 1

## 2024-04-30 MED ORDER — ONDANSETRON HCL 4 MG/2ML IJ SOLN
4.0000 mg | Freq: Once | INTRAMUSCULAR | Status: AC
  Administered 2024-04-30: 4 mg via INTRAVENOUS
  Filled 2024-04-30: qty 2

## 2024-04-30 MED ORDER — METRONIDAZOLE 500 MG/100ML IV SOLN
500.0000 mg | Freq: Once | INTRAVENOUS | Status: AC
  Administered 2024-04-30: 500 mg via INTRAVENOUS
  Filled 2024-04-30: qty 100

## 2024-04-30 MED ORDER — IOHEXOL 300 MG/ML  SOLN
100.0000 mL | Freq: Once | INTRAMUSCULAR | Status: AC | PRN
  Administered 2024-04-30: 100 mL via INTRAVENOUS

## 2024-04-30 MED ORDER — LACTATED RINGERS IV SOLN: INTRAVENOUS | Status: AC

## 2024-04-30 MED ADMIN — Lactated Ringer's Solution: 1000 mL | INTRAVENOUS | NDC 00338011704

## 2024-04-30 NOTE — ED Triage Notes (Signed)
 BIB EMS from home.  N/V/D for 2 weeks.  Weakness.  Tachy in the 130. Not able to take her meds d/t illness.

## 2024-04-30 NOTE — ED Provider Notes (Signed)
 [place] EMERGENCY DEPARTMENT AT [facility] Provider Note   CSN: [id number] Arrival date & time: 04/30/24  1726     Patient presents with: Weakness and Tachycardia   Lindsay Little is a 58 y.o. female.  {Add pertinent medical, surgical, social history, OB history to HPI:32947}  Weakness    Patient has a history of hypercholesterolemia, ureteral bowel syndrome, obesity, rectal cancer status post colostomy, chronic gastritis who presents ED with complaints of abdominal pain vomiting and diarrhea.  Patient states she started having symptoms a couple days ago.  She has had multiple episodes of vomiting as well as diarrhea.  She has a colostomy bag and has been having to empty that frequently.  She is also had diffuse abdominal pain.  She is not able to keep anything down.  Her symptoms were getting worse today and she called EMS.  Prior to Admission medications   Medication Sig Start Date End Date Taking? Authorizing Provider  bisoprolol  (ZEBETA ) 5 MG tablet Take 5 mg by mouth daily.    [provider]  cefadroxil  (DURICEF) 500 MG capsule Take 1 capsule (500 mg total) by mouth 2 (two) times daily. Patient not taking: Reported on 03/21/2024 12/12/23   Silver Fell A, PA  DULoxetine  (CYMBALTA ) 60 MG capsule Take 120 mg by mouth at bedtime.    [provider]  esomeprazole  (NEXIUM ) 40 MG capsule Take 1 capsule (40 mg total) by mouth 2 (two) times daily before a meal. Patient taking differently: Take 40 mg by mouth See admin instructions. Take 40 mg by mouth in the morning before breakfast and an additional 40 mg once a day as needed for unresolved reflux 12/22/15   Sheldon Standing, MD  famotidine  (PEPCID ) 20 MG tablet Take 20 mg by mouth 2 (two) times daily as needed for heartburn or indigestion.    [provider]  furosemide  (LASIX ) 20 MG tablet Take 20 mg by mouth daily as needed for edema.     [provider]  HYDROmorphone  (DILAUDID ) 4 MG  tablet Take one tablet (4 mg dose) by mouth every 6 (six) hours as needed for Pain for up to 30 days. Max Daily Amount: 16 mg Patient taking differently: Take 4 mg by mouth every 6 (six) hours. 05/13/23     hyoscyamine  (LEVSIN ) 0.125 MG tablet Take 0.125 mg by mouth 3 (three) times daily as needed for cramping. 01/28/23   [provider]  Ibuprofen 200 MG CAPS Take 400 mg by mouth every 6 (six) hours as needed (for pain).    [provider]  levETIRAcetam  (KEPPRA ) 250 MG tablet Take 500 mg by mouth in the morning and at bedtime.    [provider]  levothyroxine  (SYNTHROID ) 50 MCG tablet Take 50 mcg by mouth daily before breakfast. 02/07/22   [provider]  Melatonin 3 MG TABS Take 3-6 mg by mouth at bedtime as needed (sleep).    [provider]  naloxone  (NARCAN ) nasal spray 4 mg/0.1 mL Place 0.4 mg into the nose once as needed (for a crisis). 12/18/22   [provider]  NUCYNTA ER 150 MG TB12 Take 150 mg by mouth every 12 (twelve) hours.    [provider]  oxybutynin  (DITROPAN  XL) 15 MG 24 hr tablet Take 15 mg by mouth at bedtime.    [provider]  polyethylene glycol (MIRALAX  / GLYCOLAX ) 17 g packet Take 17 g by mouth See admin instructions. Mix 17 grams into the recommended amount  of a beverage and drink once a day- may take an additional 17 grams once a day as needed for constipation    [provider]  pregabalin  (LYRICA ) 225 MG capsule Take 225 mg by mouth 2 (two) times daily. 02/07/22   [provider]  promethazine  (PHENERGAN ) 25 MG tablet Take 1 tablet (25 mg total) by mouth every 6 (six) hours as needed for nausea or vomiting. 06/08/23   Curatolo, Adam, DO  rosuvastatin  (CRESTOR ) 5 MG tablet Take 5 mg by mouth every evening.    [provider]  tamsulosin  (FLOMAX ) 0.4 MG CAPS capsule Take 0.4 mg by mouth daily.    [provider]  traZODone  (DESYREL ) 50 MG tablet Take 25-50 mg by  mouth at bedtime.    [provider]  triamcinolone  lotion (KENALOG ) 0.1 % Apply 1 application  topically 2 (two) times daily as needed (for itching- scalp).    [provider]  TYLENOL  500 MG tablet Take 1,000 mg by mouth every 6 (six) hours as needed for mild pain (pain score 1-3) or headache.    [provider]    Allergies: Lidocaine , Bupropion, Metronidazole , Oxycodone , Penicillins, Sulfa antibiotics, Pantoprazole , Adhesive [tape], Doxycycline , Iron , Metoprolol , Naltrexone-bupropion hcl er, and Wound dressing adhesive    Review of Systems  Neurological:  Positive for weakness.    Updated Vital Signs BP (!) 142/113   Pulse (!) 129   Temp 99.2 F (37.3 C)   Resp (!) 21   SpO2 92%   Physical Exam Vitals and nursing note reviewed.  Constitutional:      General: She is not in acute distress.    Appearance: She is well-developed. She is ill-appearing.  HENT:     Head: Normocephalic and atraumatic.     Right Ear: External ear normal.     Left Ear: External ear normal.  Eyes:     General: No scleral icterus.       Right eye: No discharge.        Left eye: No discharge.     Conjunctiva/sclera: Conjunctivae normal.  Neck:     Trachea: No tracheal deviation.  Cardiovascular:     Rate and Rhythm: Regular rhythm. Tachycardia present.  Pulmonary:     Effort: Pulmonary effort is normal. No respiratory distress.     Breath sounds: Normal breath sounds. No stridor. No wheezing or rales.  Abdominal:     General: Bowel sounds are normal. There is no distension.     Palpations: Abdomen is soft.     Tenderness: There is abdominal tenderness. There is no guarding or rebound.     Comments: No blood noted in colostomy bag  Musculoskeletal:        General: No tenderness or deformity.     Cervical back: Neck supple.  Skin:    General: Skin is warm and dry.     Findings: No rash.  Neurological:     General: No focal deficit present.     Cranial Nerves: No  cranial nerve deficit, dysarthria or facial asymmetry.     Sensory: No sensory deficit.     Motor: No abnormal muscle tone or seizure activity.     Coordination: Coordination normal.  Psychiatric:        Mood and Affect: Mood normal.     (all labs ordered are listed, but only abnormal results are displayed) Labs Reviewed  COMPREHENSIVE METABOLIC PANEL WITH GFR - Abnormal; Notable for the following components:  Result Value   CO2 21 (*)    Glucose, Bld 144 (*)    Creatinine, Ser 1.34 (*)    Calcium  11.1 (*)    Total Protein 9.7 (*)    AST 14 (*)    GFR, Estimated 46 (*)    Anion gap 17 (*)    All other components within normal limits  CBC WITH DIFFERENTIAL/PLATELET - Abnormal; Notable for the following components:   WBC 21.8 (*)    MCH 24.5 (*)    RDW 18.2 (*)    Platelets 528 (*)    Neutro Abs 18.9 (*)    Abs Immature Granulocytes 0.15 (*)    All other components within normal limits  URINALYSIS, W/ REFLEX TO CULTURE (INFECTION SUSPECTED) - Abnormal; Notable for the following components:   APPearance CLOUDY (*)    Hgb urine dipstick SMALL (*)    Protein, ur >=300 (*)    Leukocytes,Ua MODERATE (*)    Bacteria, UA RARE (*)    All other components within normal limits  I-STAT CG4 LACTIC ACID, ED - Abnormal; Notable for the following components:   Lactic Acid, Venous 2.3 (*)    All other components within normal limits  I-STAT CG4 LACTIC ACID, ED - Abnormal; Notable for the following components:   Lactic Acid, Venous 2.2 (*)    All other components within normal limits  CULTURE, BLOOD (ROUTINE X 2)  CULTURE, BLOOD (ROUTINE X 2)  URINE CULTURE  PROTIME-INR  TROPONIN T, HIGH SENSITIVITY  TROPONIN T, HIGH SENSITIVITY    EKG: EKG Interpretation Date/Time:  Friday April 30 2024 18:05:27 EDT Ventricular Rate:  129 PR Interval:  165 QRS Duration:  84 QT Interval:  266 QTC Calculation: 390 R Axis:   -17  Text Interpretation: Sinus tachycardia Borderline left  axis deviation Low voltage, precordial leads Borderline repolarization abnormality Since last tracing rate faster Confirmed by Randol Simmonds [phone]) on 04/30/2024 6:18:58 PM  Radiology: CT ABDOMEN PELVIS W CONTRAST Result Date: 04/30/2024 EXAM: CT ABDOMEN AND PELVIS WITH CONTRAST 04/30/2024 09:11:35 PM TECHNIQUE: CT of the abdomen and pelvis was performed with the administration of 100 mL of iohexol  (OMNIPAQUE ) 300 MG/ML solution. Multiplanar reformatted images are provided for review. Automated exposure control, iterative reconstruction, and/or weight-based adjustment of the mA/kV was utilized to reduce the radiation dose to as low as reasonably achievable. COMPARISON: CT 03/21/2024 CLINICAL HISTORY: Abdominal pain, acute, nonlocalized. N/V/D for 2 weeks. Weakness. Tachy in the 130s. Not able to take her meds due to illness. FINDINGS: LOWER CHEST: No acute abnormality. LIVER: The liver is unremarkable. GALLBLADDER AND BILE DUCTS: Gallbladder is unremarkable. No biliary ductal dilatation. SPLEEN: No acute abnormality. PANCREAS: No acute abnormality. ADRENAL GLANDS: No acute abnormality. KIDNEYS, URETERS AND BLADDER: Bilateral ureteral stents are unchanged in position from 03/21/2024. Moderate bilateral hydronephrosis is similar. Similar urothelial thickening and periureteral stranding about both ureters. Delayed left nephrogram. No stones in the kidneys or ureters. Urinary bladder is unremarkable. GI AND BOWEL: Distal colectomy with left abdominal wall colostomy. Pelvic enterotomy. Stomach demonstrates no acute abnormality. There is no bowel obstruction. PERITONEUM AND RETROPERITONEUM: Similar to slight decrease in size in the presacral heterogeneous collection measuring 5.9 x 8.3 cm today, previously 7.0 x 8.6 cm. Mild adjacent stranding and free fluid. No free air. VASCULATURE: Aorta is normal in caliber. LYMPH NODES: Slightly increased size of a right iliac lymph node on series 2 image 68 measuring 12 mm,  previously 10 mm. REPRODUCTIVE ORGANS: Suspected left hydrosalpinx is unchanged. Hysterectomy.  BONES AND SOFT TISSUES: Posterior fusion L4-L5 with interbody spacer. No acute osseous abnormality. No focal soft tissue abnormality. IMPRESSION: 1. Moderate bilateral hydronephrosis with bilateral ureteral stents and associated urothelial thickening and periureteral stranding, overall unchanged from prior study. 2. Presacral heterogeneous collection or mass with mild adjacent stranding and free fluid, slightly decreased in size from prior study. 3. Right iliac lymph node mildly enlarged to 12 mm from 10 mm. Electronically signed by: Norman Gatlin MD 04/30/2024 09:55 PM EDT RP Workstation: [id number]   DG Chest Port 1 View Result Date: 04/30/2024 CLINICAL DATA:  Sepsis, abdominal pain EXAM: PORTABLE CHEST 1 VIEW COMPARISON:  02/23/2023 FINDINGS: The heart size and mediastinal contours are within normal limits. Both lungs are clear. The visualized skeletal structures are unremarkable. IMPRESSION: No active disease. Electronically Signed   By: Ozell Daring M.D.   On: 04/30/2024 19:43    {Document cardiac monitor, telemetry assessment procedure when appropriate:32947} .Critical Care  Performed by: Randol Simmonds, MD Authorized by: Randol Simmonds, MD   Critical care provider statement:    Critical care time (minutes):  45   Critical care was time spent personally by me on the following activities:  Development of treatment plan with patient or surrogate, discussions with consultants, evaluation of patient's response to treatment, examination of patient, ordering and review of laboratory studies, ordering and review of radiographic studies, ordering and performing treatments and interventions, pulse oximetry, re-evaluation of patient's condition and review of old charts    Medications Ordered in the ED  lactated ringers  infusion ( Intravenous New Bag/Given 04/30/24 1904)  lactated ringers  bolus 1,000 mL (0 mLs  Intravenous Stopped 04/30/24 1953)  morphine  (PF) 4 MG/ML injection 4 mg (4 mg Intravenous Given 04/30/24 1850)  ondansetron  (ZOFRAN ) injection 4 mg (4 mg Intravenous Given 04/30/24 1849)  lactated ringers  bolus 1,000 mL (0 mLs Intravenous Stopped 04/30/24 2145)  ceFEPIme  (MAXIPIME ) 2 g in sodium chloride  0.9 % 100 mL IVPB (0 g Intravenous Stopped 04/30/24 2145)    And  metroNIDAZOLE  (FLAGYL ) IVPB 500 mg (0 mg Intravenous Stopped 04/30/24 2145)  morphine  (PF) 4 MG/ML injection 4 mg (4 mg Intravenous Given 04/30/24 2038)  iohexol  (OMNIPAQUE ) 300 MG/ML solution 100 mL (100 mLs Intravenous Contrast Given 04/30/24 2056)    Clinical Course as of 04/30/24 2319  Fri Apr 30, 2024  1748 Patient's blood pressure is normal at the bedside although tachycardic on arrival [JK]  1911 CBC with Differential(!) Wbc increased  [JK]  1928 Comprehensive metabolic panel(!) Creatinine stable compared to previous, leukocytosis noted [JK]  2006 Lactic acid elevated.  Will add on empiric antibiotics.  CT scan and pelvis ordered [JK]  2202 CT scan shows moderate bilateral hydronephrosis with bilateral ureteral stents.  Unchanged compared to prior studies.  Patient also has presacral heterogeneous collection or mass slightly decreased from prior study [JK]    Clinical Course User Index [JK] Randol Simmonds, MD   {Click here for ABCD2, HEART and other calculators REFRESH Note before signing:1}                              Medical Decision Making Problems Addressed: Pyelonephritis: acute illness or injury that poses a threat to life or bodily functions Sepsis, due to unspecified organism, unspecified whether acute organ dysfunction present [facility]): acute illness or injury that poses a threat to life or bodily functions  Amount and/or Complexity of Data Reviewed Labs: ordered. Decision-making details documented in ED Course.  Radiology: ordered.  Risk Prescription drug management.   Patient presented to the ED for  evaluation of nausea vomiting fever weakness tachycardia.  On arrival patient noted to be tachycardic.  She appeared dehydrated and ill.  Patient's laboratory test were notable for significant leukocytosis and lactic acidosis.  Patient mildly hyperglycemic.  She does have an anion gap however lower suspicion for DKA at this time.  Will add on beta hydroxybutyric acid and check venous blood gas.  Patient's leukocytosis and elevated lactic acid level concerning for evolving sepsis.  She was started on empiric antibiotic and IV fluid hydration.  No hypotension but she has remained persistently tachycardic.  Her CT scan shows moderate bilateral hydronephrosis and bilateral ureteral stents this is unchanged.  There is also a presacral heterogeneous collection or mass. This may be related to her history of rectal cancer.  Prior care at [facility]  Urinalysis is consistent with infection this appears to be the source of her illness.  I will consult the medical service for admission further treatment {Document critical care time when appropriate  Document review of labs and clinical decision tools ie CHADS2VASC2, etc  Document your independent review of radiology images and any outside records  Document your discussion with family members, caretakers and with consultants  Document social determinants of health affecting pt's care  Document your decision making why or why not admission, treatments were needed:32947:::1}   Final diagnoses:  Pyelonephritis  Sepsis, due to unspecified organism, unspecified whether acute organ dysfunction present [facility])    ED Discharge Orders     None

## 2024-05-01 ENCOUNTER — Inpatient Hospital Stay (HOSPITAL_COMMUNITY): Admitting: Anesthesiology

## 2024-05-01 ENCOUNTER — Encounter (HOSPITAL_COMMUNITY): Payer: Self-pay | Admitting: Internal Medicine

## 2024-05-01 ENCOUNTER — Inpatient Hospital Stay (HOSPITAL_COMMUNITY)

## 2024-05-01 ENCOUNTER — Encounter (HOSPITAL_COMMUNITY)
Admission: EM | Disposition: A | Discharge status: Home / Self Care | Payer: Self-pay | Source: Home / Self Care | Attending: Internal Medicine

## 2024-05-01 DIAGNOSIS — A419 Sepsis, unspecified organism: Secondary | ICD-10-CM | POA: Diagnosis present

## 2024-05-01 DIAGNOSIS — E039 Hypothyroidism, unspecified: Secondary | ICD-10-CM | POA: Diagnosis not present

## 2024-05-01 DIAGNOSIS — Z79899 Other long term (current) drug therapy: Secondary | ICD-10-CM | POA: Diagnosis not present

## 2024-05-01 DIAGNOSIS — Z833 Family history of diabetes mellitus: Secondary | ICD-10-CM | POA: Diagnosis not present

## 2024-05-01 DIAGNOSIS — Z87891 Personal history of nicotine dependence: Secondary | ICD-10-CM | POA: Diagnosis not present

## 2024-05-01 DIAGNOSIS — E7800 Pure hypercholesterolemia, unspecified: Secondary | ICD-10-CM | POA: Diagnosis present

## 2024-05-01 DIAGNOSIS — N3001 Acute cystitis with hematuria: Secondary | ICD-10-CM

## 2024-05-01 DIAGNOSIS — G4730 Sleep apnea, unspecified: Secondary | ICD-10-CM

## 2024-05-01 DIAGNOSIS — Z83438 Family history of other disorder of lipoprotein metabolism and other lipidemia: Secondary | ICD-10-CM | POA: Diagnosis not present

## 2024-05-01 DIAGNOSIS — Z79890 Hormone replacement therapy (postmenopausal): Secondary | ICD-10-CM | POA: Diagnosis not present

## 2024-05-01 DIAGNOSIS — N3000 Acute cystitis without hematuria: Secondary | ICD-10-CM | POA: Diagnosis present

## 2024-05-01 DIAGNOSIS — Z933 Colostomy status: Secondary | ICD-10-CM | POA: Diagnosis not present

## 2024-05-01 DIAGNOSIS — Z6837 Body mass index (BMI) 37.0-37.9, adult: Secondary | ICD-10-CM | POA: Diagnosis not present

## 2024-05-01 DIAGNOSIS — N179 Acute kidney failure, unspecified: Secondary | ICD-10-CM | POA: Diagnosis not present

## 2024-05-01 DIAGNOSIS — E66812 Obesity, class 2: Secondary | ICD-10-CM | POA: Diagnosis present

## 2024-05-01 DIAGNOSIS — R9431 Abnormal electrocardiogram [ECG] [EKG]: Secondary | ICD-10-CM | POA: Diagnosis not present

## 2024-05-01 DIAGNOSIS — I10 Essential (primary) hypertension: Secondary | ICD-10-CM

## 2024-05-01 DIAGNOSIS — K219 Gastro-esophageal reflux disease without esophagitis: Secondary | ICD-10-CM | POA: Diagnosis not present

## 2024-05-01 DIAGNOSIS — E872 Acidosis, unspecified: Secondary | ICD-10-CM | POA: Diagnosis present

## 2024-05-01 DIAGNOSIS — G894 Chronic pain syndrome: Secondary | ICD-10-CM | POA: Diagnosis not present

## 2024-05-01 DIAGNOSIS — N3289 Other specified disorders of bladder: Secondary | ICD-10-CM | POA: Diagnosis not present

## 2024-05-01 DIAGNOSIS — N390 Urinary tract infection, site not specified: Secondary | ICD-10-CM | POA: Diagnosis not present

## 2024-05-01 DIAGNOSIS — I129 Hypertensive chronic kidney disease with stage 1 through stage 4 chronic kidney disease, or unspecified chronic kidney disease: Secondary | ICD-10-CM | POA: Diagnosis present

## 2024-05-01 DIAGNOSIS — Z9221 Personal history of antineoplastic chemotherapy: Secondary | ICD-10-CM | POA: Diagnosis not present

## 2024-05-01 DIAGNOSIS — N1330 Unspecified hydronephrosis: Secondary | ICD-10-CM | POA: Diagnosis not present

## 2024-05-01 DIAGNOSIS — Z7901 Long term (current) use of anticoagulants: Secondary | ICD-10-CM | POA: Diagnosis not present

## 2024-05-01 DIAGNOSIS — R6520 Severe sepsis without septic shock: Secondary | ICD-10-CM | POA: Diagnosis present

## 2024-05-01 DIAGNOSIS — C20 Malignant neoplasm of rectum: Secondary | ICD-10-CM | POA: Diagnosis not present

## 2024-05-01 DIAGNOSIS — D638 Anemia in other chronic diseases classified elsewhere: Secondary | ICD-10-CM | POA: Diagnosis not present

## 2024-05-01 DIAGNOSIS — E782 Mixed hyperlipidemia: Secondary | ICD-10-CM | POA: Diagnosis not present

## 2024-05-01 DIAGNOSIS — N136 Pyonephrosis: Secondary | ICD-10-CM | POA: Diagnosis present

## 2024-05-01 DIAGNOSIS — Z8249 Family history of ischemic heart disease and other diseases of the circulatory system: Secondary | ICD-10-CM | POA: Diagnosis not present

## 2024-05-01 DIAGNOSIS — Z923 Personal history of irradiation: Secondary | ICD-10-CM | POA: Diagnosis not present

## 2024-05-01 DIAGNOSIS — G40909 Epilepsy, unspecified, not intractable, without status epilepticus: Secondary | ICD-10-CM | POA: Diagnosis present

## 2024-05-01 DIAGNOSIS — D631 Anemia in chronic kidney disease: Secondary | ICD-10-CM | POA: Diagnosis present

## 2024-05-01 DIAGNOSIS — R112 Nausea with vomiting, unspecified: Secondary | ICD-10-CM | POA: Diagnosis not present

## 2024-05-01 LAB — CBC WITH DIFFERENTIAL/PLATELET
Abs Immature Granulocytes: 0.05 K/uL (ref 0.00–0.07)
Basophils Absolute: 0.1 K/uL (ref 0.0–0.1)
Basophils Relative: 1 %
Eosinophils Absolute: 0 K/uL (ref 0.0–0.5)
Eosinophils Relative: 0 %
HCT: 31.3 % — ABNORMAL LOW (ref 36.0–46.0)
Hemoglobin: 9.3 g/dL — ABNORMAL LOW (ref 12.0–15.0)
Immature Granulocytes: 0 %
Lymphocytes Relative: 11 %
Lymphs Abs: 1.4 K/uL (ref 0.7–4.0)
MCH: 24.2 pg — ABNORMAL LOW (ref 26.0–34.0)
MCHC: 29.7 g/dL — ABNORMAL LOW (ref 30.0–36.0)
MCV: 81.3 fL (ref 80.0–100.0)
Monocytes Absolute: 0.7 K/uL (ref 0.1–1.0)
Monocytes Relative: 6 %
Neutro Abs: 10.5 K/uL — ABNORMAL HIGH (ref 1.7–7.7)
Neutrophils Relative %: 82 %
Platelets: 364 K/uL (ref 150–400)
RBC: 3.85 MIL/uL — ABNORMAL LOW (ref 3.87–5.11)
RDW: 18.3 % — ABNORMAL HIGH (ref 11.5–15.5)
WBC: 12.8 K/uL — ABNORMAL HIGH (ref 4.0–10.5)
nRBC: 0 % (ref 0.0–0.2)

## 2024-05-01 LAB — COMPREHENSIVE METABOLIC PANEL WITH GFR
ALT: <5 U/L (ref 0–44)
AST: 11 U/L — ABNORMAL LOW (ref 15–41)
Albumin: 3.3 g/dL — ABNORMAL LOW (ref 3.5–5.0)
Alkaline Phosphatase: 83 U/L (ref 38–126)
Anion gap: 11 (ref 5–15)
BUN: 17 mg/dL (ref 6–20)
CO2: 23 mmol/L (ref 22–32)
Calcium: 9.8 mg/dL (ref 8.9–10.3)
Chloride: 103 mmol/L (ref 98–111)
Creatinine, Ser: 1.3 mg/dL — ABNORMAL HIGH (ref 0.44–1.00)
GFR, Estimated: 48 mL/min — ABNORMAL LOW (ref >60)
Glucose, Bld: 112 mg/dL — ABNORMAL HIGH (ref 70–99)
Potassium: 4.3 mmol/L (ref 3.5–5.1)
Sodium: 138 mmol/L (ref 135–145)
Total Bilirubin: 0.5 mg/dL (ref 0.0–1.2)
Total Protein: 7.3 g/dL (ref 6.5–8.1)

## 2024-05-01 LAB — I-STAT CG4 LACTIC ACID, ED: Lactic Acid, Venous: 1 mmol/L (ref 0.5–1.9)

## 2024-05-01 LAB — PHOSPHORUS: Phosphorus: 4 mg/dL (ref 2.5–4.6)

## 2024-05-01 LAB — I-STAT CHEM 8, ED
BUN: 16 mg/dL (ref 6–20)
Calcium, Ion: 1.26 mmol/L (ref 1.15–1.40)
Chloride: 104 mmol/L (ref 98–111)
Creatinine, Ser: 1.5 mg/dL — ABNORMAL HIGH (ref 0.44–1.00)
Glucose, Bld: 112 mg/dL — ABNORMAL HIGH (ref 70–99)
HCT: 29 % — ABNORMAL LOW (ref 36.0–46.0)
Hemoglobin: 9.9 g/dL — ABNORMAL LOW (ref 12.0–15.0)
Potassium: 4.3 mmol/L (ref 3.5–5.1)
Sodium: 139 mmol/L (ref 135–145)
TCO2: 23 mmol/L (ref 22–32)

## 2024-05-01 LAB — MAGNESIUM: Magnesium: 1.8 mg/dL (ref 1.7–2.4)

## 2024-05-01 SURGERY — CYSTOSCOPY, WITH RETROGRADE PYELOGRAM AND URETERAL STENT INSERTION
Anesthesia: General | Site: Ureter | Laterality: Bilateral

## 2024-05-01 MED ORDER — MAGNESIUM SULFATE 2 GM/50ML IV SOLN
2.0000 g | Freq: Once | INTRAVENOUS | Status: AC
  Administered 2024-05-01: 2 g via INTRAVENOUS
  Filled 2024-05-01: qty 50

## 2024-05-01 MED ORDER — ACETAMINOPHEN 10 MG/ML IV SOLN
INTRAVENOUS | Status: AC
  Filled 2024-05-01: qty 100

## 2024-05-01 MED ORDER — ORAL CARE MOUTH RINSE: 15.0000 mL | OROMUCOSAL | Status: DC | PRN

## 2024-05-01 MED ORDER — SODIUM CHLORIDE 0.9 % IV SOLN: 1.0000 g | INTRAVENOUS | Status: DC

## 2024-05-01 MED ORDER — MIDAZOLAM HCL 2 MG/2ML IJ SOLN
INTRAMUSCULAR | Status: AC
  Filled 2024-05-01: qty 2

## 2024-05-01 MED ORDER — OXYBUTYNIN CHLORIDE ER 5 MG PO TB24
15.0000 mg | ORAL_TABLET | Freq: Every day | ORAL | Status: DC
  Administered 2024-05-01 – 2024-05-02 (×2): 15 mg via ORAL
  Filled 2024-05-01 (×2): qty 3

## 2024-05-01 MED ORDER — AMISULPRIDE (ANTIEMETIC) 5 MG/2ML IV SOLN
INTRAVENOUS | Status: AC
  Filled 2024-05-01: qty 4

## 2024-05-01 MED ORDER — FENTANYL CITRATE (PF) 50 MCG/ML IJ SOSY
25.0000 ug | PREFILLED_SYRINGE | INTRAMUSCULAR | Status: DC | PRN
  Administered 2024-05-01 (×3): 50 ug via INTRAVENOUS

## 2024-05-01 MED ORDER — PHENYLEPHRINE 80 MCG/ML (10ML) SYRINGE FOR IV PUSH (FOR BLOOD PRESSURE SUPPORT)
PREFILLED_SYRINGE | INTRAVENOUS | Status: DC | PRN
  Administered 2024-05-01: 160 ug via INTRAVENOUS

## 2024-05-01 MED ORDER — TRAZODONE HCL 50 MG PO TABS
25.0000 mg | ORAL_TABLET | Freq: Every day | ORAL | Status: DC
  Administered 2024-05-01 – 2024-05-02 (×2): 50 mg via ORAL
  Filled 2024-05-01 (×2): qty 1

## 2024-05-01 MED ORDER — MELATONIN 3 MG PO TABS: 3.0000 mg | ORAL_TABLET | Freq: Every evening | ORAL | Status: DC | PRN

## 2024-05-01 MED ORDER — SODIUM CHLORIDE 0.9 % IV SOLN
2.0000 g | Freq: Three times a day (TID) | INTRAVENOUS | Status: DC
  Administered 2024-05-01 – 2024-05-03 (×6): 2 g via INTRAVENOUS
  Filled 2024-05-01 (×6): qty 12.5

## 2024-05-01 MED ORDER — APIXABAN 5 MG PO TABS: 5.0000 mg | ORAL_TABLET | Freq: Every day | ORAL | Status: DC

## 2024-05-01 MED ORDER — DEXAMETHASONE SOD PHOSPHATE PF 10 MG/ML IJ SOLN
INTRAMUSCULAR | Status: DC | PRN
  Administered 2024-05-01: 10 mg via INTRAVENOUS

## 2024-05-01 MED ORDER — FENTANYL CITRATE (PF) 50 MCG/ML IJ SOSY
PREFILLED_SYRINGE | INTRAMUSCULAR | Status: AC
  Filled 2024-05-01: qty 3

## 2024-05-01 MED ORDER — ACETAMINOPHEN 325 MG PO TABS: 650.0000 mg | ORAL_TABLET | Freq: Four times a day (QID) | ORAL | Status: DC | PRN

## 2024-05-01 MED ORDER — TAMSULOSIN HCL 0.4 MG PO CAPS
0.4000 mg | ORAL_CAPSULE | Freq: Every day | ORAL | Status: DC
  Administered 2024-05-01 – 2024-05-03 (×3): 0.4 mg via ORAL
  Filled 2024-05-01 (×3): qty 1

## 2024-05-01 MED ORDER — SCOPOLAMINE 1 MG/3DAYS TD PT72
MEDICATED_PATCH | TRANSDERMAL | Status: DC | PRN
  Administered 2024-05-01: 1 via TRANSDERMAL

## 2024-05-01 MED ORDER — ACETAMINOPHEN 650 MG RE SUPP: 650.0000 mg | Freq: Four times a day (QID) | RECTAL | Status: DC | PRN

## 2024-05-01 MED ORDER — PROPOFOL 10 MG/ML IV BOLUS
INTRAVENOUS | Status: DC | PRN
  Administered 2024-05-01: 100 mg via INTRAVENOUS

## 2024-05-01 MED ORDER — PREGABALIN 75 MG PO CAPS
225.0000 mg | ORAL_CAPSULE | Freq: Two times a day (BID) | ORAL | Status: DC
  Administered 2024-05-01 – 2024-05-03 (×4): 225 mg via ORAL
  Filled 2024-05-01 (×4): qty 3

## 2024-05-01 MED ORDER — HYDROMORPHONE HCL 1 MG/ML IJ SOLN
0.5000 mg | INTRAMUSCULAR | Status: DC | PRN
  Administered 2024-05-01 (×4): 0.5 mg via INTRAVENOUS
  Filled 2024-05-01: qty 1
  Filled 2024-05-01 (×2): qty 0.5
  Filled 2024-05-01: qty 1

## 2024-05-01 MED ORDER — FENTANYL CITRATE (PF) 250 MCG/5ML IJ SOLN
INTRAMUSCULAR | Status: DC | PRN
  Administered 2024-05-01: 100 ug via INTRAVENOUS

## 2024-05-01 MED ORDER — AMISULPRIDE (ANTIEMETIC) 5 MG/2ML IV SOLN
10.0000 mg | Freq: Once | INTRAVENOUS | Status: AC | PRN
  Administered 2024-05-01: 10 mg via INTRAVENOUS

## 2024-05-01 MED ORDER — HYOSCYAMINE SULFATE 0.125 MG PO TABS: 0.1250 mg | ORAL_TABLET | Freq: Three times a day (TID) | ORAL | Status: DC | PRN

## 2024-05-01 MED ORDER — SODIUM CHLORIDE 0.9 % IV BOLUS: 1000.0000 mL | Freq: Once | INTRAVENOUS | Status: DC

## 2024-05-01 MED ORDER — SODIUM CHLORIDE 0.9 % IR SOLN
Status: DC | PRN
  Administered 2024-05-01: 6000 mL via INTRAVESICAL

## 2024-05-01 MED ORDER — DULOXETINE HCL 60 MG PO CPEP
120.0000 mg | ORAL_CAPSULE | Freq: Every day | ORAL | Status: DC
  Administered 2024-05-01 – 2024-05-02 (×2): 120 mg via ORAL
  Filled 2024-05-01 (×2): qty 2

## 2024-05-01 MED ORDER — SCOPOLAMINE 1 MG/3DAYS TD PT72
MEDICATED_PATCH | TRANSDERMAL | Status: AC
  Filled 2024-05-01: qty 1

## 2024-05-01 MED ORDER — SODIUM CHLORIDE 0.9 % IV SOLN: INTRAVENOUS | Status: AC

## 2024-05-01 MED ORDER — PROPOFOL 500 MG/50ML IV EMUL
INTRAVENOUS | Status: DC | PRN
  Administered 2024-05-01: 150 ug/kg/min via INTRAVENOUS

## 2024-05-01 MED ORDER — LEVOTHYROXINE SODIUM 50 MCG PO TABS
50.0000 ug | ORAL_TABLET | Freq: Every day | ORAL | Status: DC
  Administered 2024-05-02 – 2024-05-03 (×2): 50 ug via ORAL
  Filled 2024-05-01 (×2): qty 1

## 2024-05-01 MED ORDER — ONDANSETRON HCL 4 MG/2ML IJ SOLN
INTRAMUSCULAR | Status: DC | PRN
  Administered 2024-05-01: 4 mg via INTRAVENOUS

## 2024-05-01 MED ORDER — LEVETIRACETAM 500 MG PO TABS
500.0000 mg | ORAL_TABLET | Freq: Two times a day (BID) | ORAL | Status: DC
  Administered 2024-05-01 – 2024-05-03 (×4): 500 mg via ORAL
  Filled 2024-05-01 (×4): qty 1

## 2024-05-01 MED ORDER — BISOPROLOL FUMARATE 5 MG PO TABS
5.0000 mg | ORAL_TABLET | Freq: Every day | ORAL | Status: DC
  Administered 2024-05-01 – 2024-05-03 (×3): 5 mg via ORAL
  Filled 2024-05-01 (×3): qty 1

## 2024-05-01 MED ORDER — MAGNESIUM OXIDE -MG SUPPLEMENT 400 (240 MG) MG PO TABS
400.0000 mg | ORAL_TABLET | Freq: Two times a day (BID) | ORAL | Status: DC
  Administered 2024-05-01 – 2024-05-03 (×4): 400 mg via ORAL
  Filled 2024-05-01 (×5): qty 1

## 2024-05-01 MED ORDER — ONDANSETRON HCL 4 MG/2ML IJ SOLN
4.0000 mg | Freq: Four times a day (QID) | INTRAMUSCULAR | Status: DC | PRN
  Administered 2024-05-01 – 2024-05-02 (×4): 4 mg via INTRAVENOUS
  Filled 2024-05-01 (×4): qty 2

## 2024-05-01 MED ORDER — SODIUM CHLORIDE 0.9 % IV SOLN
1.0000 g | INTRAVENOUS | Status: DC
  Administered 2024-05-01: 1 g via INTRAVENOUS
  Filled 2024-05-01: qty 10

## 2024-05-01 MED ORDER — MIDAZOLAM HCL (PF) 2 MG/2ML IJ SOLN
INTRAMUSCULAR | Status: DC | PRN
  Administered 2024-05-01: 2 mg via INTRAVENOUS

## 2024-05-01 MED ORDER — HYOSCYAMINE SULFATE 0.125 MG SL SUBL
0.1250 mg | SUBLINGUAL_TABLET | Freq: Three times a day (TID) | SUBLINGUAL | Status: DC | PRN
  Administered 2024-05-02: 0.125 mg via SUBLINGUAL
  Filled 2024-05-01: qty 1

## 2024-05-01 MED ORDER — FAMOTIDINE 20 MG PO TABS: 20.0000 mg | ORAL_TABLET | Freq: Two times a day (BID) | ORAL | Status: DC | PRN

## 2024-05-01 MED ORDER — SUCCINYLCHOLINE CHLORIDE 200 MG/10ML IV SOSY
PREFILLED_SYRINGE | INTRAVENOUS | Status: DC | PRN
  Administered 2024-05-01: 200 mg via INTRAVENOUS

## 2024-05-01 MED ORDER — APIXABAN 5 MG PO TABS
5.0000 mg | ORAL_TABLET | Freq: Two times a day (BID) | ORAL | Status: DC
  Administered 2024-05-02 – 2024-05-03 (×3): 5 mg via ORAL
  Filled 2024-05-01 (×3): qty 1

## 2024-05-01 MED ORDER — POLYETHYLENE GLYCOL 3350 17 G PO PACK
17.0000 g | PACK | Freq: Every day | ORAL | Status: DC
  Filled 2024-05-01 (×2): qty 1

## 2024-05-01 MED ORDER — TAPENTADOL HCL ER 50 MG PO TB12
150.0000 mg | ORAL_TABLET | Freq: Two times a day (BID) | ORAL | Status: DC
  Administered 2024-05-01 – 2024-05-03 (×4): 150 mg via ORAL
  Filled 2024-05-01 (×4): qty 1

## 2024-05-01 MED ORDER — FENTANYL CITRATE (PF) 100 MCG/2ML IJ SOLN
INTRAMUSCULAR | Status: AC
  Filled 2024-05-01: qty 2

## 2024-05-01 MED ORDER — HYDROMORPHONE HCL 2 MG PO TABS
4.0000 mg | ORAL_TABLET | Freq: Four times a day (QID) | ORAL | Status: DC | PRN
  Administered 2024-05-01 – 2024-05-03 (×6): 4 mg via ORAL
  Filled 2024-05-01 (×6): qty 2

## 2024-05-01 MED ORDER — ROSUVASTATIN CALCIUM 5 MG PO TABS
5.0000 mg | ORAL_TABLET | Freq: Every evening | ORAL | Status: DC
Start: 2024-05-01 — End: 2024-05-03
  Administered 2024-05-01 – 2024-05-02 (×2): 5 mg via ORAL
  Filled 2024-05-01 (×2): qty 1

## 2024-05-01 MED ADMIN — Acetaminophen IV Soln 10 MG/ML: 1000 mg | INTRAVENOUS | NDC 63323043400

## 2024-05-01 MED ADMIN — Iohexol Inj 300 MG/ML: 25 mL | NDC 00407141363

## 2024-05-01 MED ADMIN — Rocuronium Bromide IV Soln 100 MG/10ML (10 MG/ML): 10 mg | INTRAVENOUS | NDC 00143925110

## 2024-05-01 SURGICAL SUPPLY — 12 items
BAG URO CATCHER STRL LF (MISCELLANEOUS) ×1 IMPLANT
CATH URETL OPEN END 6FR 70 (CATHETERS) ×1 IMPLANT
CLOTH BEACON ORANGE TIMEOUT ST (SAFETY) ×1 IMPLANT
GLOVE BIO SURGEON STRL SZ 6.5 (GLOVE) ×1 IMPLANT
GOWN STRL REUS W/ TWL LRG LVL3 (GOWN DISPOSABLE) ×1 IMPLANT
GUIDEWIRE STR DUAL SENSOR (WIRE) ×1 IMPLANT
KIT TURNOVER KIT A (KITS) ×1 IMPLANT
MANIFOLD NEPTUNE II (INSTRUMENTS) ×1 IMPLANT
PACK CYSTO (CUSTOM PROCEDURE TRAY) ×1 IMPLANT
STENT CONTOUR NO GW 8FR 26CM (STENTS) IMPLANT
TRAY FOL W/BAG SLVR 16FR STRL (SET/KITS/TRAYS/PACK) IMPLANT
TUBING CONNECTING 10 (TUBING) ×1 IMPLANT

## 2024-05-01 NOTE — Progress Notes (Addendum)
  Carryover admission to the Day Admitter.  I discussed this case with the EDP, Dr. Randol.  Per these discussions:   This is a 58 year old female with history of rectal cancer status post partial colectomy in 2017, who is being admitted with severe sepsis due to acute cystitis, with presenting labs also notable for mild hypercalcemia, after presenting with generalized weakness over the last few days, associated with some nausea/vomiting.   He has a reported history of bilateral hydronephrosis, status post bilateral ureteral stents and is reported to follow-up with urology through [facility] for this.  Vital signs in the ED have been notable for persistent sinus tachycardia, with heart rates in the 120s, with stable blood pressures.   Labs in the ED notable for white blood cell count of 21,800, creatinine 1.34, which is reported to be consistent with her baseline, urinalysis that was consistent with urinary tract infection, initial lactic acid 2.3, with repeat value trending down to 2.2.  On CMP, was noted to have calcium  level of 11.1, with albumin level noted to be within normal limits.  Blood cultures x 2 were collected and the patient was initially started on broad-spectrum IV antibiotics in the form of cefepime  and IV Flagyl .  Chest x-ray showed no evidence of acute cardiopulmonary process, including no evidence of infiltrate.  CT abdomen/pelvis showed mild bilateral hydronephrosis along with the presence of bilateral ureteral stents.  EDP d/w on-call urology, Dr. Elisabeth, who conveyed that urology will consult/see patient in the AM.    I have placed an order for inpatient admission for further evaluation and management of the above.  I have placed some additional preliminary admit orders via the adult multi-morbid admission order set. I have also ordered continuation of existing order for lactated Ringer 's running at 150 cc/h, and have ordered a repeat lactic acid level to be checked with morning  labs, which also include orders for CMP, CBC, and magnesium  level.  I have also ordered Rocephin  and prn IV Zofran .   Regarding her mild hypercalcemia, the chronicity of this finding is currently unclear to me.  I have continued existing continuous lactated Ringer 's, as above, and have ordered repeat calcium  level in the morning via CMP, and also ordered magnesium  and phosphorus levels.  Regarding her generalized weakness, I have ordered PT/OT consults to occur in the morning as well as fall precautions.  Additionally, in setting of the patient's suprapubic discomfort, with radiation towards the left flank, the patient is requesting additional pain medication.  I subsequently ordered prn IV Dilaudid  to further address this.    Eva Pore, DO Hospitalist

## 2024-05-01 NOTE — Consult Note (Addendum)
 I have been asked to see the patient by Dr. Alm Castor for evaluation and management of bilateral hydronephrosis.  History of present illness:  58 yo woman with hx of HLD, HTN, Crohn's, chronic pain syndrome hypothyroid, seizure disorder, rectal cancer s/p LAR and colostomy 2017 (Dr. Elspeth Schultze), chronic obstructive uropathy with BL hydro with indwelling stents, and large presacral soft tissue and fluid collection of undetermined etiology who presented to [facility] ED on 03/22/2024 as a transfer from [facility] for flank pain. Imaging shows worsening BL hydro with stents in good position and rising creatinine. Underwent direct access bleomycin sclerotherapy for presacral collection with IR at [facility] 03/10/24.She was admitted last month at [facility] and noted to have an AKI with bilateral hydronephrosis.  They discussed percutaneous nephrostomy tube placement however creatinine was downtrending after Foley catheter placement.  She was discharged with an elevated creatinine still.  She represented to [facility] with generalized weakness, nausea and vomiting for the last 2 weeks.  Imaging shows bilateral hydronephrosis with an AKI.  Last stent exchange June 2025 with plans for next in early December with [facility]. Prior to that she had stent placement at [facility] in November 2024 with Dr. Carolee.   Review of systems: A 12 point comprehensive review of systems was obtained and is negative unless otherwise stated in the history of present illness.  Patient Active Problem List   Diagnosis Date Noted   Acute cystitis 05/01/2024   Acute right flank pain 05/27/2023   Pelvic mass - Bx [id number] benign (no metastasis) 04/10/2023   AKI (acute kidney injury) 02/15/2023   SBO (small bowel obstruction) - recurrent 10/30/2022   Nausea and vomiting 10/27/2022   Bladder spasms 09/16/2022   Gross hematuria 09/16/2022   Hypercalcemia 07/28/2022   Leukocytosis 07/28/2022   Hyperlipidemia 07/28/2022   Seizure disorder  (HCC) 07/28/2022   Nausea & vomiting 07/28/2022   Acquired hypothyroidism 02/15/2022   GERD (gastroesophageal reflux disease) 02/15/2022   Hyperglycemia 02/15/2022   Urinary incontinence, mixed 02/15/2022   Bilateral hydronephrosis 02/15/2022   Overactive bladder 01/07/2022   Mixed incontinence urge and stress 11/29/2021   Nocturia 11/29/2021   Peripheral neuropathy due to chemotherapy 10/03/2021   Adverse effect of antineoplastic and immunosuppressive drugs, initial encounter 10/03/2021   Spondylolisthesis, lumbar region 10/03/2021   Hypophosphatemia 10/13/2020   History of small bowel obstruction 10/13/2020   Hardware complicating wound infection 08/03/2019   Discitis, unspecified, lumbar region 07/15/2019   Chronic pain syndrome 07/24/2018   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   Seroma of musculoskeletal structure after non-musculoskeletal system procedure 04/24/2018   S/P lumbar spinal fusion 02/12/2018   Genetic testing 01/12/2018   Family history of pancreatic cancer    Family history of skin cancer    Family history of breast cancer    Colostomy in place (HCC) 07/12/2016   Hydroureter on right 12/20/2015   Abdominal pain 12/19/2015   Hypokalemia 11/23/2015   Anemia of chronic disease 06/14/2015   Essential hypertension    IBS (irritable bowel syndrome)    Depression    Cavernous hemangioma of liver - segments 5 & 7 02/07/2015   Unilateral primary osteoarthritis, left knee 02/07/2015   Crohn's disease (HCC) 02/07/2015   Rectal adenocarcinoma s/p LAR resection 05/25/2015 01/31/2015   Pure hypercholesterolemia 02/21/2014   Obesity, Class III, BMI 40-49.9 (morbid obesity) (HCC) 02/21/2014   Family history of ischemic heart disease 02/21/2014    No current facility-administered medications on file prior to  encounter.   Current Outpatient Medications on File Prior to Encounter  Medication Sig Dispense Refill   apixaban (ELIQUIS) 5  MG TABS tablet Take 5 mg by mouth daily.     bisoprolol  (ZEBETA ) 5 MG tablet Take 5 mg by mouth daily.     DULoxetine  (CYMBALTA ) 60 MG capsule Take 120 mg by mouth at bedtime.     famotidine  (PEPCID ) 20 MG tablet Take 20 mg by mouth 2 (two) times daily as needed for heartburn or indigestion.     furosemide  (LASIX ) 20 MG tablet Take 20 mg by mouth daily as needed for edema.      HYDROmorphone  (DILAUDID ) 4 MG tablet Take one tablet (4 mg dose) by mouth every 6 (six) hours as needed for Pain for up to 30 days. Max Daily Amount: 16 mg (Patient taking differently: Take 4 mg by mouth every 6 (six) hours.) 120 tablet 0   hyoscyamine  (LEVSIN ) 0.125 MG tablet Take 0.125 mg by mouth 3 (three) times daily as needed for cramping.     Ibuprofen 200 MG CAPS Take 400 mg by mouth every 6 (six) hours as needed (for pain).     levETIRAcetam  (KEPPRA ) 250 MG tablet Take 500 mg by mouth in the morning and at bedtime.     levothyroxine  (SYNTHROID ) 50 MCG tablet Take 50 mcg by mouth daily before breakfast.     magnesium  oxide (MAG-OX) 400 MG tablet Take 400 mg by mouth 2 (two) times daily.     Melatonin 3 MG TABS Take 3-6 mg by mouth at bedtime as needed (sleep).     naloxone  (NARCAN ) nasal spray 4 mg/0.1 mL Place 0.4 mg into the nose once as needed (for a crisis).     NUCYNTA ER 150 MG TB12 Take 150 mg by mouth every 12 (twelve) hours.     oxybutynin  (DITROPAN  XL) 15 MG 24 hr tablet Take 15 mg by mouth at bedtime.     polyethylene glycol (MIRALAX  / GLYCOLAX ) 17 g packet Take 17 g by mouth See admin instructions. Mix 17 grams into the recommended amount of a beverage and drink once a day- may take an additional 17 grams once a day as needed for constipation     pregabalin  (LYRICA ) 225 MG capsule Take 225 mg by mouth 2 (two) times daily.     promethazine  (PHENERGAN ) 25 MG tablet Take 1 tablet (25 mg total) by mouth every 6 (six) hours as needed for nausea or vomiting. 30 tablet 0   rosuvastatin  (CRESTOR ) 5 MG tablet Take  5 mg by mouth every evening.     tamsulosin  (FLOMAX ) 0.4 MG CAPS capsule Take 0.4 mg by mouth daily.     traZODone  (DESYREL ) 50 MG tablet Take 25-50 mg by mouth at bedtime.     triamcinolone  lotion (KENALOG ) 0.1 % Apply 1 application  topically 2 (two) times daily as needed (for itching- scalp).     TYLENOL  500 MG tablet Take 1,000 mg by mouth every 6 (six) hours as needed for mild pain (pain score 1-3) or headache.     cefadroxil  (DURICEF) 500 MG capsule Take 1 capsule (500 mg total) by mouth 2 (two) times daily. (Patient not taking: Reported on 03/21/2024) 12 capsule 0   esomeprazole  (NEXIUM ) 40 MG capsule Take 1 capsule (40 mg total) by mouth 2 (two) times daily before a meal. (Patient not taking: Reported on 05/01/2024) 60 capsule 5    Past Medical History:  Diagnosis Date   Anxiety  Chemotherapy-induced neuropathy    toes and fingers numbness and tingling   Colorectal delayed anastomotic leak s/p resection & colostomy 07/12/2016 11/17/2015   Colostomy in place [facility]) 07/12/2016   due to anastomosis breakdown w/ colovaginal fistula   Colovaginal fistula s/p omentopexy repair 07/12/2016 07/10/2016   Complication of anesthesia    Depression    Family history of adverse reaction to anesthesia    mother-- ponv   Family history of breast cancer    Family history of pancreatic cancer    Family history of skin cancer    Gastroenteritis 12/20/2015   GERD (gastroesophageal reflux disease)    Hardware complicating wound infection 08/03/2019   Hiatal hernia    History of cancer chemotherapy 02-20-2015 to 03-30-2015   History of cardiac murmur as a child    History of chemotherapy    History of chronic gastritis    History of hypertension    no issues since multiple abdminal sx's and chemo--- no medication since 12/ 2016   History of TMJ disorder    Hypercholesteremia    Hypertension    IBS (irritable bowel syndrome)    dx age 55   Intermittent abdominal pain    post-op multiple abdominal  sx's    Microcytic anemia    Mild sleep apnea    per pt study 2014  very mild osa , no cpap recommended, recommeded wt loss and sleep routine   OA (osteoarthritis)    left knee /  left shoulder   Obesity    Parastomal hernia s/p lap repair w mesh 07/23/2018 07/23/2018   Pelvic abscess s/p drainage & omental pedicle flap 11/17/2015 06/23/2015   PONV (postoperative nausea and vomiting)    severe needs Scopolamine  PATCH    Portacath in place    right chest   Rectal adenocarcinoma (HCC) oncologis-  dr lanny-- after radiation/ chemo (ypT1, N0) --  no recurrence per last note 03/ 2018   dx 01-13-2015-- Stage IIIC (T3, N2, M0) post concurrent radiation and chemotherapy 02-20-2015 to 03-30-2015 /  05-25-2015 s/p  LAR w/ RSO (post-op complicated by late abscess and contained anatomotic leak w/ help percutaneous drainage and antibiotics)    Rectocutaneous fistula 07/10/2016   Rotator cuff tear, left    S/P radiation therapy 02/20/15-03/30/15   colon/rectal   Vitamin D  deficiency    Wears glasses    Wears glasses     Past Surgical History:  Procedure Laterality Date   ABDOMINAL HYSTERECTOMY  1996   uterus and cervix   BACK SURGERY  02/12/2018   lumbar surgery   COLON RESECTION N/A 07/12/2016   Procedure: LAPAROSCOPIC LYSIS OF ADHESIONS, OMENTOPEXY, HAND ASSISTED RESECTION OF  COLON, END TO END ANASTOMOSIS, COLOSTOMY;  Surgeon: Elspeth Schultze, MD;  Location: [facility];  Service: General;  Laterality: N/A;   COMBINED HYSTEROSCOPY DIAGNOSTIC / D&C  x2 1990's   CYSTOSCOPY W/ URETERAL STENT PLACEMENT Bilateral 02/16/2023   Procedure: CYSTOSCOPY WITH RETROGRADE PYELOGRAM BILATERAL STENT EXCHANGE;  Surgeon: Shona Layman BROCKS, MD;  Location: [facility];  Service: Urology;  Laterality: Bilateral;   CYSTOSCOPY WITH STENT PLACEMENT Bilateral 05/28/2023   Procedure: CYSTOSCOPY, BILATERAL RETROGRADE PYELOGRAM, BILATERAL STENT EXCHANGE;  Surgeon: Carolee Sherwood JONETTA DOUGLAS, MD;  Location: [facility];  Service: Urology;  Laterality:  Bilateral;   DIAGNOSTIC LAPAROSCOPY  age 61 and age 73   EUS N/A 01/18/2015   Procedure: LOWER ENDOSCOPIC ULTRASOUND (EUS);  Surgeon: Elsie Cree, MD;  Location: [facility];  Service: Endoscopy;  Laterality: N/A;  EXCISION OF SKIN TAG  11/17/2015   Procedure: EXCISION OF SKIN TAG;  Surgeon: Elspeth Schultze, MD;  Location: [facility];  Service: General;;   ILEO LOOP COLOSTOMY CLOSURE N/A 11/17/2015   Procedure: LAPAROSCOPIC DIVERTING LOOP ILEOSTOMY  DRAINAGE OF PELVIC ABSCESS;  Surgeon: Elspeth Schultze, MD;  Location: [facility];  Service: General;  Laterality: N/A;   ILEOSTOMY CLOSURE N/A 05/31/2016   Procedure: TAKEDOWN LOOP ILEOSTOMY;  Surgeon: Elspeth Schultze, MD;  Location: [facility];  Service: General;  Laterality: N/A;   IMPACTION REMOVAL  11/17/2015   Procedure: DISIMPACTION REMOVAL;  Surgeon: Elspeth Schultze, MD;  Location: [facility];  Service: General;;   INSERTION OF MESH N/A 07/23/2018   Procedure: INSERTION OF MESH X2;  Surgeon: Schultze Elspeth, MD;  Location: [facility];  Service: General;  Laterality: N/A;   IR LUMBAR DISC ASPIRATION W/IMG GUIDE  07/01/2019   KNEE ARTHROSCOPY Left 1990's   KNEE ARTHROSCOPY W/ MENISCECTOMY Left 09/14/2009   and chondroplasty debridement   LAPAROSCOPIC LYSIS OF ADHESIONS  11/17/2015   Procedure: LAPAROSCOPIC LYSIS OF ADHESIONS;  Surgeon: Elspeth Schultze, MD;  Location: [facility];  Service: General;;   LUMBAR WOUND DEBRIDEMENT N/A 04/24/2018   Procedure: wound exploration, irrigation and debridement;  Surgeon: Joshua Alm RAMAN, MD;  Location: [facility] OR;  Service: Neurosurgery;  Laterality: N/A;  wound exploration, irrigation and debridement   LYSIS OF ADHESION N/A 07/23/2018   Procedure: LYSIS OF ADHESIONS;  Surgeon: Schultze Elspeth, MD;  Location: [facility];  Service: General;  Laterality: N/A;   PORT-A-CATH REMOVAL N/A 11/21/2016   Procedure: REMOVAL PORT-A-CATH;  Surgeon: Schultze Elspeth, MD;  Location: [facility];  Service: General;  Laterality: N/A;   PORTACATH PLACEMENT N/A  05/25/2015   Procedure: INSERTION PORT-A-CATH;  Surgeon: Elspeth Schultze, MD;  Location: [facility];  Service: General;  Laterality: N/A;-remains inplace Right chest.   ROTATOR CUFF REPAIR Right 2006   TONSILLECTOMY  age 42   VENTRAL HERNIA REPAIR N/A 07/23/2018   Procedure: LAPAROSCOPIC VENTRAL WALL HERNIA REPAIR;  Surgeon: Schultze Elspeth, MD;  Location: [facility];  Service: General;  Laterality: N/A;   XI ROBOTIC ASSISTED LOWER ANTERIOR RESECTION N/A 05/25/2015   Procedure: XI ROBOTIC ASSISTED LOWER ANTERIOR RESECTION, , RIGID PROCTOSCOPY, RIGHT OOPHORECTOMY;  Surgeon: Elspeth Schultze, MD;  Location: [facility];  Service: General;  Laterality: N/A;    Social History   Tobacco Use   Smoking status: Former    Current packs/day: 0.00    Average packs/day: 0.3 packs/day for 7.0 years (1.8 ttl pk-yrs)    Types: Cigarettes    Start date: 07/15/1984    Quit date: 07/16/1991    Years since quitting: 32.8   Smokeless tobacco: Never  Vaping Use   Vaping status: Never Used  Substance Use Topics   Alcohol use: No   Drug use: Never    Family History  Problem Relation Age of Onset   Coronary artery disease Mother 61   Hypertension Mother    Hyperlipidemia Mother    Diabetes Mellitus I Mother    Coronary artery disease Father    Hyperlipidemia Father    Hypertension Father    Cancer Sister        skin - non melanoma   Hyperlipidemia Brother    Cancer Maternal Uncle 16       pancreatic with mets to colon and prostate   Cirrhosis Maternal Uncle    Hypertension Maternal Grandmother    Diabetes Mellitus I Maternal Grandmother    Hyperlipidemia Maternal  Grandmother    CVA Maternal Grandmother    Hypertension Maternal Grandfather    Coronary artery disease Maternal Grandfather 65   Hyperlipidemia Maternal Grandfather    Coronary artery disease Paternal Grandmother    Hypertension Paternal Grandmother    Hyperlipidemia Paternal Grandmother    Diabetes Mellitus I Paternal Grandmother    Hypertension Paternal  Grandfather    Hyperlipidemia Paternal Grandfather    Coronary artery disease Paternal Grandfather     PE: Vitals:   05/01/24 0408 05/01/24 0500 05/01/24 0600 05/01/24 0630  BP: (!) 141/85 120/85 (!) 127/107   Pulse: (!) 109 (!) 107 (!) 111 (!) 113  Resp: (!) 21 13 14 15   Temp:   97.8 F (36.6 C)   TempSrc:      SpO2: 93% (!) 89% 92% 92%   Patient appears to be in no acute distress  patient is alert and oriented x3 Atraumatic normocephalic head No increased work of breathing, no audible wheezes/rhonchi Regular sinus rhythm/rate Abdomen is nondistended Grossly neurologically intact No identifiable skin lesions  Recent Labs    04/30/24 1839 05/01/24 0456 05/01/24 0520  WBC 21.8* 12.8*  --   HGB 12.0 9.3* 9.9*  HCT 39.5 31.3* 29.0*   Recent Labs    04/30/24 1839 05/01/24 0456 05/01/24 0520  NA 138 138 139  K 4.0 4.3 4.3  CL 101 103 104  CO2 21* 23  --   GLUCOSE 144* 112* 112*  BUN 17 17 16   CREATININE 1.34* 1.30* 1.50*  CALCIUM  11.1* 9.8  --    Recent Labs    04/30/24 1839  INR 1.0   No results for input(s): LABURIN in the last 72 hours. Results for orders placed or performed during the hospital encounter of 03/21/24  Culture, blood (Routine x 2)     Status: Abnormal   Collection Time: 03/21/24  8:40 AM   Specimen: BLOOD  Result Value Ref Range Status   Specimen Description   Final    BLOOD RIGHT ANTECUBITAL Performed at [facility], [address] [place], [place] [address]    Special Requests   Final    BOTTLES DRAWN AEROBIC AND ANAEROBIC Blood Culture adequate volume Performed at [facility], [address] [place], [place] [address]    Culture  Setup Time   Final    GRAM POSITIVE COCCI AEROBIC BOTTLE ONLY CRITICAL RESULT CALLED TO, READ BACK BY AND VERIFIED WITH: PHARMD N GLOGOVAC 03/22/2024 @ 0622 BY AB    Culture (A)  Final    STAPHYLOCOCCUS HOMINIS STAPHYLOCOCCUS EPIDERMIDIS THE SIGNIFICANCE OF  ISOLATING THIS ORGANISM FROM A SINGLE SET OF BLOOD CULTURES WHEN MULTIPLE SETS ARE DRAWN IS UNCERTAIN. PLEASE NOTIFY THE MICROBIOLOGY DEPARTMENT WITHIN ONE WEEK IF SPECIATION AND SENSITIVITIES ARE REQUIRED. Performed at [facility], [address], [place], [place] [address]    Report Status 03/24/2024 FINAL  Final  Blood Culture ID Panel (Reflexed)     Status: Abnormal   Collection Time: 03/21/24  8:40 AM  Result Value Ref Range Status   Enterococcus faecalis NOT DETECTED NOT DETECTED Final   Enterococcus Faecium NOT DETECTED NOT DETECTED Final   Listeria monocytogenes NOT DETECTED NOT DETECTED Final   Staphylococcus species DETECTED (A) NOT DETECTED Final    Comment: CRITICAL RESULT CALLED TO, READ BACK BY AND VERIFIED WITH: PHARMD N GLOGOVAC 03/22/2024 @ 0622 BY AB    Staphylococcus aureus (BCID) NOT DETECTED NOT DETECTED Final   Staphylococcus epidermidis DETECTED (A)  NOT DETECTED Final    Comment: Methicillin (oxacillin) resistant coagulase negative staphylococcus. Possible blood culture contaminant (unless isolated from more than one blood culture draw or clinical case suggests pathogenicity). No antibiotic treatment is indicated for blood  culture contaminants. CRITICAL RESULT CALLED TO, READ BACK BY AND VERIFIED WITH: PHARMD N GLOGOVAC 03/22/2024 @ 0622 BY AB    Staphylococcus lugdunensis NOT DETECTED NOT DETECTED Final   Streptococcus species NOT DETECTED NOT DETECTED Final   Streptococcus agalactiae NOT DETECTED NOT DETECTED Final   Streptococcus pneumoniae NOT DETECTED NOT DETECTED Final   Streptococcus pyogenes NOT DETECTED NOT DETECTED Final   A.calcoaceticus-baumannii NOT DETECTED NOT DETECTED Final   Bacteroides fragilis NOT DETECTED NOT DETECTED Final   Enterobacterales NOT DETECTED NOT DETECTED Final   Enterobacter cloacae complex NOT DETECTED NOT DETECTED Final   Escherichia coli NOT DETECTED NOT DETECTED Final   Klebsiella aerogenes NOT DETECTED NOT DETECTED Final    Klebsiella oxytoca NOT DETECTED NOT DETECTED Final   Klebsiella pneumoniae NOT DETECTED NOT DETECTED Final   Proteus species NOT DETECTED NOT DETECTED Final   Salmonella species NOT DETECTED NOT DETECTED Final   Serratia marcescens NOT DETECTED NOT DETECTED Final   Haemophilus influenzae NOT DETECTED NOT DETECTED Final   Neisseria meningitidis NOT DETECTED NOT DETECTED Final   Pseudomonas aeruginosa NOT DETECTED NOT DETECTED Final   Stenotrophomonas maltophilia NOT DETECTED NOT DETECTED Final   Candida albicans NOT DETECTED NOT DETECTED Final   Candida auris NOT DETECTED NOT DETECTED Final   Candida glabrata NOT DETECTED NOT DETECTED Final   Candida krusei NOT DETECTED NOT DETECTED Final   Candida parapsilosis NOT DETECTED NOT DETECTED Final   Candida tropicalis NOT DETECTED NOT DETECTED Final   Cryptococcus neoformans/gattii NOT DETECTED NOT DETECTED Final   Methicillin resistance mecA/C DETECTED (A) NOT DETECTED Final    Comment: CRITICAL RESULT CALLED TO, READ BACK BY AND VERIFIED WITH: PHARMD N GLOGOVAC 03/22/2024 @ 0622 BY AB Performed at [facility], [address], [place], [place] [address]   SARS Coronavirus 2 by RT PCR (hospital order, performed in [facility] hospital lab) *cepheid single result test* Anterior Nasal Swab     Status: None   Collection Time: 03/21/24  9:05 AM   Specimen: Anterior Nasal Swab  Result Value Ref Range Status   SARS Coronavirus 2 by RT PCR NEGATIVE NEGATIVE Final    Comment: (NOTE) SARS-CoV-2 target nucleic acids are NOT DETECTED.  The SARS-CoV-2 RNA is generally detectable in upper and lower respiratory specimens during the acute phase of infection. The lowest concentration of SARS-CoV-2 viral copies this assay can detect is 250 copies / mL. A negative result does not preclude SARS-CoV-2 infection and should not be used as the sole basis for treatment or other patient management decisions.  A negative result may occur with improper  specimen collection / handling, submission of specimen other than nasopharyngeal swab, presence of viral mutation(s) within the areas targeted by this assay, and inadequate number of viral copies (<250 copies / mL). A negative result must be combined with clinical observations, patient history, and epidemiological information.  Fact Sheet for Patients:   RoadLapTop.co.za  Fact Sheet for Healthcare Providers: http://kim-miller.com/  This test is not yet approved or  cleared by the [place]  FDA and has been authorized for detection and/or diagnosis of SARS-CoV-2 by FDA under an Emergency Use Authorization (EUA).  This EUA will remain in effect (meaning this test can be used) for the duration of the  COVID-19 declaration under Section 564(b)(1) of the Act, 21 U.S.C. section 360bbb-3(b)(1), unless the authorization is terminated or revoked sooner.  Performed at [facility], [address] [place], [place] [address]   Urine Culture     Status: Abnormal   Collection Time: 03/21/24  2:24 PM   Specimen: Urine, Random  Result Value Ref Range Status   Specimen Description   Final    URINE, RANDOM Performed at [facility], [address] [place], [place] [address]    Special Requests   Final    NONE Reflexed from [id number] Performed at [facility], [address], [place], [place] [address]    Culture MULTIPLE SPECIES PRESENT, SUGGEST RECOLLECTION (A)  Final   Report Status 03/22/2024 FINAL  Final  Culture, blood (Routine X 2) w Reflex to ID Panel     Status: None   Collection Time: 03/22/24  9:30 AM   Specimen: BLOOD RIGHT HAND  Result Value Ref Range Status   Specimen Description   Final    BLOOD RIGHT HAND Performed at [facility], [address], [place], [place] [address]    Special Requests   Final    BOTTLES DRAWN AEROBIC AND ANAEROBIC Blood Culture results may  not be optimal due to an inadequate volume of blood received in culture bottles Performed at [facility], [address], [place], [place] [address]    Culture   Final    NO GROWTH 5 DAYS Performed at [facility], [address], [place], [place] [address]    Report Status 03/27/2024 FINAL  Final    Imaging: CT Abd/Pelvis  IMPRESSION: 1. Moderate bilateral hydronephrosis with bilateral ureteral stents and associated urothelial thickening and periureteral stranding, overall unchanged from prior study. 2. Presacral heterogeneous collection or mass with mild adjacent stranding and free fluid, slightly decreased in size from prior study. 3. Right iliac lymph node mildly enlarged to 12 mm from 10 mm.   Electronically signed by: Norman Gatlin MD 04/30/2024 09:55 PM EDT RP Workstation: [id number]  Assessment/Plan: Bilateral hydronephrosis Acute cystitis/pyelonephritis  - Patient's WBC is improving and urine culture is pending.  Overall her vital signs appear more stable. - We discussed management of chronic bilateral hydronephrosis.  This is the longest she has ever gone with out a stent exchange and it is planned with [facility] in December 2025.  During her last hospital admission they discussed percutaneous bilateral nephrostomy tubes however creatinine was downtrending.  It does not appear to have normalized. - We did discuss again bilateral percutaneous nephrostomy tubes.  Patient wishes to try a stent exchange first and see if this will help resolve the problem. - Will plan for stent exchange today.  If creatinine does not improve would likely consider nephrostomy tubes as next step with IR.  Zelina Jimerson D Donnavin Vandenbrink

## 2024-05-01 NOTE — H&P (Addendum)
 History and Physical    Patient: Lindsay Little FMW:[id number] DOB: Sep 23, 1965 DOA: 04/30/2024 DOS: the patient was seen and examined on 05/01/2024 PCP: Teresa Channel, MD  Patient coming from: Home  Chief Complaint:  Chief Complaint  Patient presents with   Weakness   Tachycardia   HPI: Lindsay Little is a 58 y.o. female with medical history significant of   chronic obstructive uropathy requiring bilateral ureteral stents for bilateral hydronephrosis, lumbar fusion, chronic back pain, hypertension, hyperlipidemia, hypothyroidism, depression, class III obesity, GERD, hiatal hernia, history of gastroenteritis, history of Crohn's disease, peritoneal vaginal fistula, history of multiple episodes of SBO, seizure disorder, chronic pain syndrome, and a peritoneal-vaginal fistula with recurrent bleeding following bleomycin therapy in late 02/2024, as well as rectal adenocarcinoma (s/p resection with ostomy and chemoXRT in 2017, complicated by intraabdominal abscess and lumbar discitis s/p aspiration with negative cultures in 2021), admitted last month [facility] after the Hematology/Oncology team referred her on 9/8 for evaluation of abdominal pain. Her CT on 9/7 revealed a presacral soft tissue collection for which IR performed an FNA, but the sample was inadequate and no cultures were obtained.  Yesterday evening, she presented to the emergency department with complaints of generalized weakness, nausea, vomiting and diarrhea for the past 2 weeks. She denied fever, chills, rhinorrhea, sore throat, wheezing or hemoptysis.  She has felt lightheaded, but no chest pain, palpitations, diaphoresis, PND, orthopnea or pitting edema of the lower extremities. No constipation, melena or hematochezia. No dysuria, frequency or hematuria.  No polyuria, polydipsia or polyphagia.  Lab work: CBC showed a white count of 21.8 with 87% neutrophils, hemoglobin 12.0 g/dL and platelets 471.  PT was 13.4 and INR  1.0.  Lactic acid 2.3, then 2.3 and 1.0 mmol/L CMP showed a CO2 of 21 mmol/L with an anion gap of 17.  Glucose 144, BUN 17, creatinine 1.34 and corrected calcium  10.9 mg/dL.  Total protein 9.7 and albumin 4.3 g/dL.  The rest of the electrolytes and LFTs were unremarkable.  Imaging: Portable 1 view chest radiograph with no active disease.  CT abdomen/pelvis with contrast showing moderate bilateral hydronephrosis with bilateral ureteral stents and associated urothelial thickening with periureteral stranding, overall unchanged from prior study.  There is a presacral heterogeneous collection or mass with mild adjacent stranding and free fluid, slightly decreasing in size from prior study.  Right iliac lymph node mildly enlarged to 12 mm from 10 mm last month.   ED course: Initial vital signs were temperature 98.7 F, pulse 126, respirations 15, BP 155/109 mmHg and O2 sat 94% on room air.  The patient received cefepime  2 g IVPB, metronidazole  500 mg IVPB, morphine  4 mg IVP x 2 and 1000 mL normal saline bolus.  Review of Systems: As mentioned in the history of present illness. All other systems reviewed and are negative. Past Medical History:  Diagnosis Date   Anxiety    Chemotherapy-induced neuropathy    toes and fingers numbness and tingling   Colorectal delayed anastomotic leak s/p resection & colostomy 07/12/2016 11/17/2015   Colostomy in place [facility]) 07/12/2016   due to anastomosis breakdown w/ colovaginal fistula   Colovaginal fistula s/p omentopexy repair 07/12/2016 07/10/2016   Complication of anesthesia    Depression    Family history of adverse reaction to anesthesia    mother-- ponv   Family history of breast cancer    Family history of pancreatic cancer    Family history of skin cancer    Gastroenteritis 12/20/2015  GERD (gastroesophageal reflux disease)    Hardware complicating wound infection 08/03/2019   Hiatal hernia    History of cancer chemotherapy 02-20-2015 to 03-30-2015    History of cardiac murmur as a child    History of chemotherapy    History of chronic gastritis    History of hypertension    no issues since multiple abdminal sx's and chemo--- no medication since 12/ 2016   History of TMJ disorder    Hypercholesteremia    Hypertension    IBS (irritable bowel syndrome)    dx age 87   Intermittent abdominal pain    post-op multiple abdominal sx's    Microcytic anemia    Mild sleep apnea    per pt study 2014  very mild osa , no cpap recommended, recommeded wt loss and sleep routine   OA (osteoarthritis)    left knee /  left shoulder   Obesity    Parastomal hernia s/p lap repair w mesh 07/23/2018 07/23/2018   Pelvic abscess s/p drainage & omental pedicle flap 11/17/2015 06/23/2015   PONV (postoperative nausea and vomiting)    severe needs Scopolamine  PATCH    Portacath in place    right chest   Rectal adenocarcinoma (HCC) oncologis-  dr lanny-- after radiation/ chemo (ypT1, N0) --  no recurrence per last note 03/ 2018   dx 01-13-2015-- Stage IIIC (T3, N2, M0) post concurrent radiation and chemotherapy 02-20-2015 to 03-30-2015 /  05-25-2015 s/p  LAR w/ RSO (post-op complicated by late abscess and contained anatomotic leak w/ help percutaneous drainage and antibiotics)    Rectocutaneous fistula 07/10/2016   Rotator cuff tear, left    S/P radiation therapy 02/20/15-03/30/15   colon/rectal   Vitamin D  deficiency    Wears glasses    Wears glasses    Past Surgical History:  Procedure Laterality Date   ABDOMINAL HYSTERECTOMY  1996   uterus and cervix   BACK SURGERY  02/12/2018   lumbar surgery   COLON RESECTION N/A 07/12/2016   Procedure: LAPAROSCOPIC LYSIS OF ADHESIONS, OMENTOPEXY, HAND ASSISTED RESECTION OF  COLON, END TO END ANASTOMOSIS, COLOSTOMY;  Surgeon: Elspeth Schultze, MD;  Location: [facility];  Service: General;  Laterality: N/A;   COMBINED HYSTEROSCOPY DIAGNOSTIC / D&C  x2 1990's   CYSTOSCOPY W/ URETERAL STENT PLACEMENT Bilateral 02/16/2023   Procedure:  CYSTOSCOPY WITH RETROGRADE PYELOGRAM BILATERAL STENT EXCHANGE;  Surgeon: Shona Layman BROCKS, MD;  Location: [facility];  Service: Urology;  Laterality: Bilateral;   CYSTOSCOPY WITH STENT PLACEMENT Bilateral 05/28/2023   Procedure: CYSTOSCOPY, BILATERAL RETROGRADE PYELOGRAM, BILATERAL STENT EXCHANGE;  Surgeon: Carolee Sherwood JONETTA DOUGLAS, MD;  Location: [facility];  Service: Urology;  Laterality: Bilateral;   DIAGNOSTIC LAPAROSCOPY  age 78 and age 56   EUS N/A 01/18/2015   Procedure: LOWER ENDOSCOPIC ULTRASOUND (EUS);  Surgeon: Elsie Cree, MD;  Location: [facility];  Service: Endoscopy;  Laterality: N/A;   EXCISION OF SKIN TAG  11/17/2015   Procedure: EXCISION OF SKIN TAG;  Surgeon: Elspeth Schultze, MD;  Location: [facility];  Service: General;;   ILEO LOOP COLOSTOMY CLOSURE N/A 11/17/2015   Procedure: LAPAROSCOPIC DIVERTING LOOP ILEOSTOMY  DRAINAGE OF PELVIC ABSCESS;  Surgeon: Elspeth Schultze, MD;  Location: [facility];  Service: General;  Laterality: N/A;   ILEOSTOMY CLOSURE N/A 05/31/2016   Procedure: TAKEDOWN LOOP ILEOSTOMY;  Surgeon: Elspeth Schultze, MD;  Location: [facility];  Service: General;  Laterality: N/A;   IMPACTION REMOVAL  11/17/2015   Procedure: DISIMPACTION REMOVAL;  Surgeon: Elspeth Schultze,  MD;  Location: [facility];  Service: General;;   INSERTION OF MESH N/A 07/23/2018   Procedure: INSERTION OF MESH X2;  Surgeon: Sheldon Standing, MD;  Location: [facility];  Service: General;  Laterality: N/A;   IR LUMBAR DISC ASPIRATION W/IMG GUIDE  07/01/2019   KNEE ARTHROSCOPY Left 1990's   KNEE ARTHROSCOPY W/ MENISCECTOMY Left 09/14/2009   and chondroplasty debridement   LAPAROSCOPIC LYSIS OF ADHESIONS  11/17/2015   Procedure: LAPAROSCOPIC LYSIS OF ADHESIONS;  Surgeon: Standing Sheldon, MD;  Location: [facility];  Service: General;;   LUMBAR WOUND DEBRIDEMENT N/A 04/24/2018   Procedure: wound exploration, irrigation and debridement;  Surgeon: Joshua Alm RAMAN, MD;  Location[facility];  Service: Neurosurgery;  Laterality: N/A;  wound exploration, irrigation  and debridement   LYSIS OF ADHESION N/A 07/23/2018   Procedure: LYSIS OF ADHESIONS;  Surgeon: Sheldon Standing, MD;  Location: [facility];  Service: General;  Laterality: N/A;   PORT-A-CATH REMOVAL N/A 11/21/2016   Procedure: REMOVAL PORT-A-CATH;  Surgeon: Sheldon Standing, MD;  Location: [facility];  Service: General;  Laterality: N/A;   PORTACATH PLACEMENT N/A 05/25/2015   Procedure: INSERTION PORT-A-CATH;  Surgeon: Standing Sheldon, MD;  Location: [facility];  Service: General;  Laterality: N/A;-remains inplace Right chest.   ROTATOR CUFF REPAIR Right 2006   TONSILLECTOMY  age 89   VENTRAL HERNIA REPAIR N/A 07/23/2018   Procedure: LAPAROSCOPIC VENTRAL WALL HERNIA REPAIR;  Surgeon: Sheldon Standing, MD;  Location: [facility];  Service: General;  Laterality: N/A;   XI ROBOTIC ASSISTED LOWER ANTERIOR RESECTION N/A 05/25/2015   Procedure: XI ROBOTIC ASSISTED LOWER ANTERIOR RESECTION, , RIGID PROCTOSCOPY, RIGHT OOPHORECTOMY;  Surgeon: Standing Sheldon, MD;  Location: [facility];  Service: General;  Laterality: N/A;   Social History:  reports that she quit smoking about 32 years ago. Her smoking use included cigarettes. She started smoking about 39 years ago. She has a 1.8 pack-year smoking history. She has never used smokeless tobacco. She reports that she does not drink alcohol and does not use drugs.  Allergies  Allergen Reactions   Lidocaine  Swelling, Dermatitis, Rash and Other (See Comments)    Eyes swell shut; includes all -caine drugs except marcaine . EMLA  cream OK though   Bupropion Other (See Comments)    Not effective    Metronidazole  Nausea And Vomiting and Other (See Comments)    GI Intolerance and sleeplessness   Oxycodone  Other (See Comments)    NIGHTMARES. (tolerates hydrocodone  or tramadol  better)   Penicillins Nausea Only, Dermatitis and Rash   Sulfa Antibiotics Nausea And Vomiting, Dermatitis, Rash and Other (See Comments)   Pantoprazole  Other (See Comments)    not effective   Adhesive  [Tape] Rash and Other (See Comments)    Blisters - can use paper tape   Doxycycline  Nausea Only and Other (See Comments)    GI Intolerance   Iron  Nausea Only and Other (See Comments)    GI Intolerance   Metoprolol  Nausea Only and Palpitations   Naltrexone-Bupropion Hcl Er Palpitations and Other (See Comments)    Nightmares, too   Wound Dressing Adhesive Other (See Comments), Rash and Dermatitis    Blisters - can use paper tape    Family History  Problem Relation Age of Onset   Coronary artery disease Mother 61   Hypertension Mother    Hyperlipidemia Mother    Diabetes Mellitus I Mother    Coronary artery disease Father    Hyperlipidemia Father    Hypertension Father  Cancer Sister        skin - non melanoma   Hyperlipidemia Brother    Cancer Maternal Uncle 61       pancreatic with mets to colon and prostate   Cirrhosis Maternal Uncle    Hypertension Maternal Grandmother    Diabetes Mellitus I Maternal Grandmother    Hyperlipidemia Maternal Grandmother    CVA Maternal Grandmother    Hypertension Maternal Grandfather    Coronary artery disease Maternal Grandfather 61   Hyperlipidemia Maternal Grandfather    Coronary artery disease Paternal Grandmother    Hypertension Paternal Grandmother    Hyperlipidemia Paternal Grandmother    Diabetes Mellitus I Paternal Grandmother    Hypertension Paternal Grandfather    Hyperlipidemia Paternal Grandfather    Coronary artery disease Paternal Grandfather     Prior to Admission medications   Medication Sig Start Date End Date Taking? Authorizing Provider  apixaban (ELIQUIS) 5 MG TABS tablet Take 5 mg by mouth daily. 04/10/24  Yes [provider]  bisoprolol  (ZEBETA ) 5 MG tablet Take 5 mg by mouth daily.   Yes [provider]  DULoxetine  (CYMBALTA ) 60 MG capsule Take 120 mg by mouth at bedtime.   Yes [provider]  famotidine  (PEPCID ) 20 MG tablet Take 20 mg by mouth 2 (two) times daily as needed for  heartburn or indigestion.   Yes [provider]  furosemide  (LASIX ) 20 MG tablet Take 20 mg by mouth daily as needed for edema.    Yes [provider]  HYDROmorphone  (DILAUDID ) 4 MG tablet Take one tablet (4 mg dose) by mouth every 6 (six) hours as needed for Pain for up to 30 days. Max Daily Amount: 16 mg Patient taking differently: Take 4 mg by mouth every 6 (six) hours. 05/13/23  Yes   hyoscyamine  (LEVSIN ) 0.125 MG tablet Take 0.125 mg by mouth 3 (three) times daily as needed for cramping. 01/28/23  Yes [provider]  Ibuprofen 200 MG CAPS Take 400 mg by mouth every 6 (six) hours as needed (for pain).   Yes [provider]  levETIRAcetam  (KEPPRA ) 250 MG tablet Take 500 mg by mouth in the morning and at bedtime.   Yes [provider]  levothyroxine  (SYNTHROID ) 50 MCG tablet Take 50 mcg by mouth daily before breakfast. 02/07/22  Yes [provider]  magnesium  oxide (MAG-OX) 400 MG tablet Take 400 mg by mouth 2 (two) times daily. 04/09/24  Yes [provider]  Melatonin 3 MG TABS Take 3-6 mg by mouth at bedtime as needed (sleep).   Yes [provider]  naloxone  (NARCAN ) nasal spray 4 mg/0.1 mL Place 0.4 mg into the nose once as needed (for a crisis). 12/18/22  Yes [provider]  NUCYNTA ER 150 MG TB12 Take 150 mg by mouth every 12 (twelve) hours.   Yes [provider]  oxybutynin  (DITROPAN  XL) 15 MG 24 hr tablet Take 15 mg by mouth at bedtime.   Yes [provider]  polyethylene glycol (MIRALAX  / GLYCOLAX ) 17 g packet Take 17 g by mouth See admin instructions. Mix 17 grams into the recommended amount of a beverage and drink once a day- may take an additional 17 grams once a day as needed for constipation   Yes [provider]  pregabalin  (LYRICA ) 225 MG capsule Take 225 mg by mouth 2 (two) times daily. 02/07/22  Yes [provider]  promethazine  (PHENERGAN ) 25 MG tablet Take 1 tablet (25  mg total) by  mouth every 6 (six) hours as needed for nausea or vomiting. 06/08/23  Yes Curatolo, Adam, DO  rosuvastatin  (CRESTOR ) 5 MG tablet Take 5 mg by mouth every evening.   Yes [provider]  tamsulosin  (FLOMAX ) 0.4 MG CAPS capsule Take 0.4 mg by mouth daily.   Yes [provider]  traZODone  (DESYREL ) 50 MG tablet Take 25-50 mg by mouth at bedtime.   Yes [provider]  triamcinolone  lotion (KENALOG ) 0.1 % Apply 1 application  topically 2 (two) times daily as needed (for itching- scalp).   Yes [provider]  TYLENOL  500 MG tablet Take 1,000 mg by mouth every 6 (six) hours as needed for mild pain (pain score 1-3) or headache.   Yes [provider]  cefadroxil  (DURICEF) 500 MG capsule Take 1 capsule (500 mg total) by mouth 2 (two) times daily. Patient not taking: Reported on 03/21/2024 12/12/23   Silver Wonda LABOR, PA  esomeprazole  (NEXIUM ) 40 MG capsule Take 1 capsule (40 mg total) by mouth 2 (two) times daily before a meal. Patient not taking: Reported on 05/01/2024 12/22/15   Sheldon Standing, MD    Physical Exam: Vitals:   05/01/24 0500 05/01/24 0600 05/01/24 0630 05/01/24 0700  BP: 120/85 (!) 127/107  124/86  Pulse: (!) 107 (!) 111 (!) 113 (!) 112  Resp: 13 14 15 14   Temp:  97.8 F (36.6 C)    TempSrc:      SpO2: (!) 89% 92% 92% 93%   Physical Exam Vitals and nursing note reviewed.  Constitutional:      Appearance: She is obese. She is ill-appearing.  HENT:     Head: Normocephalic.     Nose: No rhinorrhea.     Mouth/Throat:     Mouth: Mucous membranes are dry.  Eyes:     General: No scleral icterus.    Pupils: Pupils are equal, round, and reactive to light.  Cardiovascular:     Rate and Rhythm: Regular rhythm. Tachycardia present.  Pulmonary:     Breath sounds: No wheezing, rhonchi or rales.  Abdominal:     General: The ostomy site is clean. Bowel sounds are normal. There is no distension.     Tenderness: There is abdominal  tenderness.  Musculoskeletal:     Cervical back: Neck supple.     Right lower leg: No edema.     Left lower leg: No edema.  Skin:    General: Skin is warm and dry.  Neurological:     General: No focal deficit present.     Mental Status: She is alert and oriented to person, place, and time.  Psychiatric:        Mood and Affect: Mood normal.        Behavior: Behavior normal.     Data Reviewed:  Results are pending, will review when available. 02/02/2018 echocardiogram report. History:   PMH:  Pulmonary artery abnormality.   -------------------------------------------------------------------  Study Conclusions   - Left ventricle: The cavity size was normal. Wall thickness was    increased in a pattern of mild LVH. Systolic function was normal.    The estimated ejection fraction was in the range of 50% to 55%.    Wall motion was normal; there were no regional wall motion    abnormalities. Doppler parameters are consistent with abnormal    left ventricular relaxation (grade 1 diastolic dysfunction). The    E/e&' ratio is between 8-15, suggesting indeterminate LV filling    pressure.  -  Mitral valve: Calcified annulus. Mildly thickened leaflets .    There was trivial regurgitation.  - Left atrium: The atrium was normal in size.  - Inferior vena cava: The vessel was normal in size. The    respirophasic diameter changes were in the normal range (>= 50%),    consistent with normal central venous pressure.   Impressions:   - LVEF 50-55%, mild LVH, normal wall motion, grade 1 DD,    indeterminate LV filling pressure, trivial MR, normal LA Size,    normal IVC.    EKG: Vent. rate 129 BPM PR interval 165 ms QRS duration 84 ms QT/QTcB 266/390 ms P-R-T axes 80 -17 170 Sinus tachycardia Borderline left axis deviation Low voltage, precordial leads Borderline repolarization abnormality  Assessment and Plan: Principal Problem:   Acute cystitis Complicated UTI (urinary tract  infection)  Associated with:   Bladder spasms   Bilateral hydronephrosis Status post bilateral urinary stent placement. Admit to PCU/inpatient. Continue IV fluids. Analgesics as needed. Antiemetics as needed. Begin cefepime  2 g every 8 hours.   Follow-up blood culture and sensitivity Follow CBC and CMP in a.m. Urology consult appreciated. -Status post bilateral urinary stent exchange.  Active Problems:   AKI (acute kidney injury) Continue IV fluids. Hold diuretic Avoid hypotension. Avoid nephrotoxins. Monitor intake and output. Monitor renal function electrolytes.    Nausea and vomiting Continue IV fluids. Symptoms treatment.    Anemia of chronic disease Monitor hematocrit and hemoglobin.    Chronic pain syndrome Continue home Nucynta and oral hydromorphone .    Rectal adenocarcinoma s/p LAR resection 05/25/2015 Last month the patient had a presacral mass. This was biopsied by IR, but it was not a good sample. IR working with the patient to obtain another sample.     Colostomy in place [facility]) Continues to have normal output.     Pure hypercholesterolemia Continue rosuvastatin  5 mg p.o. daily.     Hypertension Continue bisoprolol  5 mg p.o. daily.    Acquired hypothyroidism Levothyroxine  50 mcg daily.     GERD (gastroesophageal reflux disease) Famotidine  as needed. Antiacid as needed.     Class 2 obesity Current BMI 37.64 kg/m. Would benefit from lifestyle modifications. Follow-up closely with PCP and/or bariatric clinic.    Advance Care Planning:   Code Status: Full Code   Consults: Urology Alston Leeroy Shank, MD).  Family Communication:   Severity of Illness: The appropriate patient status for this patient is INPATIENT. Inpatient status is judged to be reasonable and necessary in order to provide the required intensity of service to ensure the patient's safety. The patient's presenting symptoms, physical exam findings, and initial radiographic and  laboratory data in the context of their chronic comorbidities is felt to place them at high risk for further clinical deterioration. Furthermore, it is not anticipated that the patient will be medically stable for discharge from the hospital within 2 midnights of admission.   * I certify that at the point of admission it is my clinical judgment that the patient will require inpatient hospital care spanning beyond 2 midnights from the point of admission due to high intensity of service, high risk for further deterioration and high frequency of surveillance required.*  Author: Alm Dorn Castor, MD 05/01/2024 8:24 AM  For on call review [email or web address].   This document was prepared using Dragon voice recognition software and may contain some unintended transcription errors.

## 2024-05-01 NOTE — Anesthesia Procedure Notes (Signed)
 Procedure Name: Intubation Date/Time: 05/01/2024 10:25 AM  Performed by: Cena Epps, CRNAPre-anesthesia Checklist: Patient identified, Emergency Drugs available, Suction available and Patient being monitored Patient Re-evaluated:Patient Re-evaluated prior to induction Oxygen Delivery Method: Circle System Utilized Preoxygenation: Pre-oxygenation with 100% oxygen Induction Type: Rapid sequence, Cricoid Pressure applied and IV induction Laryngoscope Size: Mac and 3 Grade View: Grade I Tube type: Oral Tube size: 7.0 mm Number of attempts: 1 Airway Equipment and Method: Stylet and Oral airway Placement Confirmation: ETT inserted through vocal cords under direct vision, positive ETCO2 and breath sounds checked- equal and bilateral Secured at: 22 cm Tube secured with: Tape Dental Injury: Teeth and Oropharynx as per pre-operative assessment

## 2024-05-01 NOTE — Anesthesia Preprocedure Evaluation (Addendum)
 Anesthesia Evaluation  Patient identified by MRN, date of birth, ID band Patient awake    Reviewed: Allergy & Precautions, NPO status , Patient's Chart, lab work & pertinent test results, reviewed documented beta blocker date and time   History of Anesthesia Complications (+) PONV and history of anesthetic complications  Airway Mallampati: II  TM Distance: >3 FB Neck ROM: Full    Dental  (+) Dental Advisory Given, Chipped,    Pulmonary sleep apnea , former smoker   Pulmonary exam normal breath sounds clear to auscultation       Cardiovascular hypertension, Pt. on home beta blockers and Pt. on medications Normal cardiovascular exam Rhythm:Regular Rate:Normal  TTE 2019 - Left ventricle: The cavity size was normal. Wall thickness was    increased in a pattern of mild LVH. Systolic function was normal.    The estimated ejection fraction was in the range of 50% to 55%.    Wall motion was normal; there were no regional wall motion    abnormalities. Doppler parameters are consistent with abnormal    left ventricular relaxation (grade 1 diastolic dysfunction). The    E/e&' ratio is between 8-15, suggesting indeterminate LV filling    pressure.  - Mitral valve: Calcified annulus. Mildly thickened leaflets .    There was trivial regurgitation.  - Left atrium: The atrium was normal in size.  - Inferior vena cava: The vessel was normal in size. The    respirophasic diameter changes were in the normal range (>= 50%),    consistent with normal central venous pressure.     Neuro/Psych  PSYCHIATRIC DISORDERS Anxiety Depression       GI/Hepatic hiatal hernia,GERD  ,,(+)     substance abuse (dilaudid  4mg )  Crohn's Rectal adenocarcinoma s/p LAR resection   Endo/Other  Hypothyroidism  Class 3 obesity  Renal/GU Renal InsufficiencyRenal diseaseLab Results      Component                Value               Date                      NA                        139                 05/01/2024                CL                       104                 05/01/2024                K                        4.3                 05/01/2024                CO2                      23                  05/01/2024  BUN                      16                  05/01/2024                CREATININE               1.50 (H)            05/01/2024                GFRNONAA                 48 (L)              05/01/2024                CALCIUM                   9.8                 05/01/2024                PHOS                     4.0                 05/01/2024                ALBUMIN                  3.3 (L)             05/01/2024                GLUCOSE                  112 (H)             05/01/2024             negative genitourinary   Musculoskeletal  (+) Arthritis ,  narcotic dependent  Abdominal   Peds  Hematology  (+) Blood dyscrasia, anemia Lab Results      Component                Value               Date                      WBC                      12.8 (H)            05/01/2024                HGB                      9.9 (L)             05/01/2024                HCT                      29.0 (L)            05/01/2024                MCV  81.3                05/01/2024                PLT                      364                 05/01/2024              Anesthesia Other Findings   Reproductive/Obstetrics                              Anesthesia Physical Anesthesia Plan  ASA: 3 and emergent  Anesthesia Plan: General   Post-op Pain Management: Ofirmev  IV (intra-op)*   Induction: Intravenous and Rapid sequence  PONV Risk Score and Plan: 4 or greater and Midazolam , Dexamethasone , Ondansetron , Scopolamine  patch - Pre-op and TIVA  Airway Management Planned: Oral ETT and Video Laryngoscope Planned  Additional Equipment:   Intra-op Plan:   Post-operative Plan: Extubation in  OR  Informed Consent: I have reviewed the patients History and Physical, chart, labs and discussed the procedure including the risks, benefits and alternatives for the proposed anesthesia with the patient or authorized representative who has indicated his/her understanding and acceptance.     Dental advisory given  Plan Discussed with: CRNA  Anesthesia Plan Comments:          Anesthesia Quick Evaluation

## 2024-05-01 NOTE — Op Note (Addendum)
 Preoperative diagnosis:  Bilateral hydronephrosis   Postoperative diagnosis:  Bilateral hydronephrosis   Procedure:  Cystoscopy bilateral ureteral stent exchange - 8Fr x 26cm no string bilateral retrograde pyelography with interpretation   Surgeon: Valli Shank, MD  Anesthesia: General  Complications: None  Intraoperative findings:   Right retrograde pyelogram shows moderate to severe hydronephrosis to distal ureter Left retrograde pyelogram shows moderate to severe hydronephrosis to distal ureter Normal urethra Calcified ureteral stents  EBL: Minimal  Specimens: None  Indication: Lindsay Little is a 58 y.o. patient with bilateral obstructive uropathy managed with bilateral indwelling ureteral stents.  Recent imaging shows worsening renal function and hydronephrosis. After reviewing the management options for treatment, he elected to proceed with the above surgical procedure(s). We have discussed the potential benefits and risks of the procedure, side effects of the proposed treatment, the likelihood of the patient achieving the goals of the procedure, and any potential problems that might occur during the procedure or recuperation. Informed consent has been obtained.  Description of procedure:  The patient was taken to the operating room and general anesthesia was induced.  The patient was placed in the dorsal lithotomy position, prepped and draped in the usual sterile fashion, and preoperative antibiotics were administered. A preoperative time-out was performed.   Cystourethroscopy was performed.  The patient's urethra was examined and was normal.  Patient with purulent urine.  The bladder was then systematically examined in its entirety. There was no evidence for any bladder tumors, stones, or other mucosal pathology.    Attention then turned to the right ureteral orifice and graspers were used to bring the right ureteral to the urethral meatus.  A 0.38 sensor wire was  advanced through the ureteral stent however calcification was seen and resistance was met.  The stent was discarded.  An open ended ureteral catheter was advanced over the sensor wire.  Omnipaque  contrast was injected through the ureteral catheter and a retrograde pyelogram was performed with findings as dictated above.  A 0.38 sensor guidewire was then advanced up the right ureter into the renal pelvis under fluoroscopic guidance.  The wire was then backloaded through the cystoscope and a ureteral stent was advance over the wire using Seldinger technique.  The stent was positioned appropriately under fluoroscopic and cystoscopic guidance.  The wire was then removed with an adequate stent curl noted in the renal pelvis as well as in the bladder.  Attention then turned to the left ureteral orifice. The same procedure was performed on the other side.   The bladder was then emptied and the procedure ended.  The patient appeared to tolerate the procedure well and without complications.  The patient was able to be awakened and transferred to the recovery unit in satisfactory condition.   Plan: Transfer to floor and would keep foley catheter for 24 hour for maximum drainage.    Valli Shank, M.D.

## 2024-05-01 NOTE — Transfer of Care (Signed)
 Immediate Anesthesia Transfer of Care Note  Patient: Lindsay Little  Procedure(s) Performed: CYSTOSCOPY, WITH RETROGRADE PYELOGRAM AND URETERAL STENT INSERTION (Bilateral: Ureter)  Patient Location: PACU  Anesthesia Type:General  Level of Consciousness: drowsy  Airway & Oxygen Therapy: Patient Spontanous Breathing and Patient connected to face mask oxygen  Post-op Assessment: Report given to RN and Post -op Vital signs reviewed and stable  Post vital signs: Reviewed and stable  Last Vitals:  Vitals Value Taken Time  BP 129/98 05/01/24 11:05  Temp 36.2 C 05/01/24 11:05  Pulse 110 05/01/24 11:07  Resp 13 05/01/24 11:07  SpO2 90 % 05/01/24 11:07  Vitals shown include unfiled device data.  Last Pain:  Vitals:   05/01/24 0935  TempSrc:   PainSc: 0-No pain         Complications: No notable events documented.

## 2024-05-02 ENCOUNTER — Inpatient Hospital Stay (HOSPITAL_COMMUNITY)

## 2024-05-02 DIAGNOSIS — N390 Urinary tract infection, site not specified: Secondary | ICD-10-CM

## 2024-05-02 DIAGNOSIS — K219 Gastro-esophageal reflux disease without esophagitis: Secondary | ICD-10-CM

## 2024-05-02 DIAGNOSIS — R9431 Abnormal electrocardiogram [ECG] [EKG]: Secondary | ICD-10-CM | POA: Diagnosis not present

## 2024-05-02 DIAGNOSIS — D638 Anemia in other chronic diseases classified elsewhere: Secondary | ICD-10-CM

## 2024-05-02 DIAGNOSIS — E039 Hypothyroidism, unspecified: Secondary | ICD-10-CM

## 2024-05-02 DIAGNOSIS — R112 Nausea with vomiting, unspecified: Secondary | ICD-10-CM | POA: Diagnosis not present

## 2024-05-02 DIAGNOSIS — N179 Acute kidney failure, unspecified: Secondary | ICD-10-CM

## 2024-05-02 DIAGNOSIS — N1330 Unspecified hydronephrosis: Secondary | ICD-10-CM

## 2024-05-02 DIAGNOSIS — Z933 Colostomy status: Secondary | ICD-10-CM

## 2024-05-02 DIAGNOSIS — N3289 Other specified disorders of bladder: Secondary | ICD-10-CM

## 2024-05-02 DIAGNOSIS — C20 Malignant neoplasm of rectum: Secondary | ICD-10-CM

## 2024-05-02 DIAGNOSIS — E66812 Obesity, class 2: Secondary | ICD-10-CM

## 2024-05-02 DIAGNOSIS — I10 Essential (primary) hypertension: Secondary | ICD-10-CM

## 2024-05-02 DIAGNOSIS — G40909 Epilepsy, unspecified, not intractable, without status epilepticus: Secondary | ICD-10-CM

## 2024-05-02 DIAGNOSIS — E782 Mixed hyperlipidemia: Secondary | ICD-10-CM

## 2024-05-02 DIAGNOSIS — G894 Chronic pain syndrome: Secondary | ICD-10-CM

## 2024-05-02 LAB — COMPREHENSIVE METABOLIC PANEL WITH GFR
ALT: <5 U/L (ref 0–44)
AST: <10 U/L — ABNORMAL LOW (ref 15–41)
Albumin: 3.2 g/dL — ABNORMAL LOW (ref 3.5–5.0)
Alkaline Phosphatase: 68 U/L (ref 38–126)
Anion gap: 10 (ref 5–15)
BUN: 20 mg/dL (ref 6–20)
CO2: 21 mmol/L — ABNORMAL LOW (ref 22–32)
Calcium: 9.2 mg/dL (ref 8.9–10.3)
Chloride: 105 mmol/L (ref 98–111)
Creatinine, Ser: 1.23 mg/dL — ABNORMAL HIGH (ref 0.44–1.00)
GFR, Estimated: 51 mL/min — ABNORMAL LOW (ref >60)
Glucose, Bld: 121 mg/dL — ABNORMAL HIGH (ref 70–99)
Potassium: 4.1 mmol/L (ref 3.5–5.1)
Sodium: 137 mmol/L (ref 135–145)
Total Bilirubin: 0.3 mg/dL (ref 0.0–1.2)
Total Protein: 6.6 g/dL (ref 6.5–8.1)

## 2024-05-02 LAB — CBC
HCT: 26.1 % — ABNORMAL LOW (ref 36.0–46.0)
Hemoglobin: 7.7 g/dL — ABNORMAL LOW (ref 12.0–15.0)
MCH: 24.2 pg — ABNORMAL LOW (ref 26.0–34.0)
MCHC: 29.5 g/dL — ABNORMAL LOW (ref 30.0–36.0)
MCV: 82.1 fL (ref 80.0–100.0)
Platelets: 343 K/uL (ref 150–400)
RBC: 3.18 MIL/uL — ABNORMAL LOW (ref 3.87–5.11)
RDW: 17.9 % — ABNORMAL HIGH (ref 11.5–15.5)
WBC: 11.7 K/uL — ABNORMAL HIGH (ref 4.0–10.5)
nRBC: 0 % (ref 0.0–0.2)

## 2024-05-02 LAB — ECHOCARDIOGRAM COMPLETE
AR max vel: 2.94 cm2
AV Area VTI: 2.94 cm2
AV Area mean vel: 2.8 cm2
AV Mean grad: 9 mmHg
AV Peak grad: 14 mmHg
Ao pk vel: 1.87 m/s
Area-P 1/2: 3.3 cm2
Height: 71 in
S' Lateral: 2.3 cm
Weight: 4391.56 [oz_av]

## 2024-05-02 LAB — URINE CULTURE: Culture: <10000 — AB

## 2024-05-02 LAB — IRON AND TIBC
Iron: 17 ug/dL — ABNORMAL LOW (ref 28–170)
Saturation Ratios: 5 % — ABNORMAL LOW (ref 10.4–31.8)
TIBC: 315 ug/dL (ref 250–450)
UIBC: 298 ug/dL

## 2024-05-02 LAB — VITAMIN B12: Vitamin B-12: 214 pg/mL (ref 180–914)

## 2024-05-02 LAB — FOLATE: Folate: 6.4 ng/mL (ref >5.9)

## 2024-05-02 LAB — FERRITIN: Ferritin: 45 ng/mL (ref 11–307)

## 2024-05-02 MED ORDER — PERFLUTREN LIPID MICROSPHERE
1.0000 mL | INTRAVENOUS | Status: AC | PRN
  Administered 2024-05-02: 2 mL via INTRAVENOUS

## 2024-05-02 MED ORDER — HYDROMORPHONE HCL 1 MG/ML IJ SOLN
0.5000 mg | Freq: Once | INTRAMUSCULAR | Status: AC
  Administered 2024-05-02: 0.5 mg via INTRAVENOUS
  Filled 2024-05-02: qty 0.5

## 2024-05-02 MED ORDER — HYDROMORPHONE HCL 2 MG PO TABS
4.0000 mg | ORAL_TABLET | Freq: Once | ORAL | Status: AC
  Administered 2024-05-02: 4 mg via ORAL
  Filled 2024-05-02: qty 2

## 2024-05-02 NOTE — Evaluation (Signed)
 Physical Therapy Evaluation Patient Details Name: Lindsay Little MRN: [id number] DOB: 07/23/1965 Today's Date: 05/02/2024  History of Present Illness  Pt admitted from home with bil hydronephrosis and now s/p bil ureteral stent exchange.  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  Pt should progress to dc home with family assist.        If plan is discharge home, recommend the following:     Can travel by private vehicle        Equipment Recommendations None recommended by PT  Recommendations for Other Services       Functional Status Assessment Patient has had a recent decline in their functional status and demonstrates the ability to make significant improvements in function in a reasonable and predictable amount of time.     Precautions / Restrictions Precautions Precautions: Fall Restrictions Weight Bearing Restrictions Per Provider Order: No      Mobility  Bed Mobility Overal bed mobility: Needs Assistance Bed Mobility: Supine to Sit     Supine to sit: Supervision     General bed mobility comments: use of bed rail    Transfers Overall transfer level: Needs assistance Equipment used: Rolling walker (2 wheels) Transfers: Sit to/from Stand Sit to Stand: Contact guard assist           General transfer comment: safety only    Ambulation/Gait Ambulation/Gait assistance: Contact guard assist Gait Distance (Feet): 100 Feet Assistive device: Rolling walker (2 wheels) Gait Pattern/deviations: Step-to pattern, Step-through pattern, Decreased step length - right, Decreased step length - left, Shuffle, Trunk flexed       General Gait Details: min cues for posture and position from RW; distance ltd by LE and back  discomfort  Stairs            Wheelchair Mobility     Tilt Bed    Modified Rankin (Stroke Patients Only)       Balance Overall balance assessment: Needs assistance Sitting-balance support: No upper extremity supported, Feet supported Sitting balance-Leahy Scale: Good     Standing balance support: No upper extremity supported Standing balance-Leahy Scale: Fair                               Pertinent Vitals/Pain Pain Assessment Pain Assessment: 0-10 Pain Score: 5  Pain Location: back, low abdomen Pain Descriptors / Indicators: Aching, Discomfort Pain Intervention(s): Premedicated before session, Monitored during session    Home Living Family/patient expects to be discharged to:: Private residence Living Arrangements: Spouse/significant other;Children Available Help at Discharge: Family;Available 24 hours/day Type of Home: House Home Access: Ramped entrance;Stairs to enter Entrance Stairs-Rails: Right;Left;Can reach both Entrance Stairs-Number of Steps: 5   Home Layout: One level Home Equipment: Rollator (4 wheels);Rolling Walker (2 wheels);Cane - single point;Shower seat - built in;Hand held shower head;Grab bars - tub/shower;Grab bars - toilet      Prior Function Prior Level of Function : Needs assist             Mobility Comments: walks with RW, can drive--hasn't lately ADLs Comments: family assists with meals, housekeeping     Extremity/Trunk Assessment   Upper Extremity Assessment Upper Extremity Assessment: Overall WFL for tasks assessed    Lower Extremity Assessment Lower Extremity Assessment:  RLE deficits/detail;LLE deficits/detail RLE Sensation: history of peripheral neuropathy LLE Sensation: history of peripheral neuropathy    Cervical / Trunk Assessment Cervical / Trunk Assessment: Other exceptions (chronic back and abdomen pain)  Communication   Communication Communication: No apparent difficulties    Cognition  Arousal: Alert Behavior During Therapy: WFL for tasks assessed/performed                             Following commands: Intact       Cueing Cueing Techniques: Verbal cues     General Comments      Exercises     Assessment/Plan    PT Assessment Patient needs continued PT services  PT Problem List         PT Treatment Interventions DME instruction;Gait training;Stair training;Functional mobility training;Therapeutic activities;Therapeutic exercise;Patient/family education;Balance training    PT Goals (Current goals can be found in the Care Plan section)  Acute Rehab PT Goals Patient Stated Goal: Regain IND PT Goal Formulation: With patient Time For Goal Achievement: 05/16/24 Potential to Achieve Goals: Good    Frequency Min 2X/week     Co-evaluation PT/OT/SLP Co-Evaluation/Treatment: Yes Reason for Co-Treatment: To address functional/ADL transfers PT goals addressed during session: Mobility/safety with mobility OT goals addressed during session: ADL's and self-care       AM-PAC PT 6 Clicks Mobility  Outcome Measure Help needed turning from your back to your side while in a flat bed without using bedrails?: None Help needed moving from lying on your back to sitting on the side of a flat bed without using bedrails?: A Little Help needed moving to and from a bed to a chair (including a wheelchair)?: A Little Help needed standing up from a chair using your arms (e.g., wheelchair or bedside chair)?: A Little Help needed to walk in hospital room?: A Little Help needed climbing 3-5 steps with a railing? : A Little 6 Click Score: 19    End of Session Equipment Utilized During Treatment: Gait belt Activity Tolerance: Patient tolerated treatment well Patient left: in chair;with call bell/phone within reach;with chair alarm set Nurse Communication: Mobility status PT Visit Diagnosis: Difficulty in walking, not elsewhere classified (R26.2);Pain Pain - part  of body:  (chronic back and bil LE pain)    Time: 8866-8840 PT Time Calculation (min) (ACUTE ONLY): 26 min   Charges:   PT Evaluation $PT Eval Low Complexity: 1 Low   PT General Charges $$ ACUTE PT VISIT: 1 Visit         [facility] PT Acute Rehabilitation Services Office [phone]   Aaylah Pokorny 05/02/2024, 1:47 PM

## 2024-05-02 NOTE — Plan of Care (Signed)
 Per Dr. Elisabeth, plan to remove foley tomorrow morning and discharge after she begins to void again.

## 2024-05-02 NOTE — Progress Notes (Signed)
 PROGRESS NOTE    Lindsay Little  FMW:[id number] DOB: January 31, 1966 DOA: 04/30/2024 PCP: Teresa Channel, MD   Brief Narrative:  Lindsay Little is a 58 y.o. female presents with presented to the emergency department with complaints of generalized weakness, nausea, vomiting and diarrhea for the past 2 weeks. Patient has known medical history significant of chronic obstructive uropathy requiring bilateral ureteral stents for bilateral hydronephrosis, lumbar fusion, chronic back pain, hypertension, hyperlipidemia, hypothyroidism, depression, class III obesity, GERD, hiatal hernia, history of gastroenteritis, history of Crohn's disease, peritoneal vaginal fistula, history of multiple episodes of SBO, seizure disorder, chronic pain syndrome, and a peritoneal-vaginal fistula with recurrent bleeding following bleomycin therapy in late 02/2024, as well as rectal adenocarcinoma (s/p resection with ostomy and chemoXRT in 2017, complicated by intraabdominal abscess and lumbar discitis s/p aspiration with negative cultures in 2021)  Of note patient was admitted on 03/22/2024 to an outside facility for draining pelvic collection likely related to rectal cancer complicated by hydroureteronephrosis, hypokalemia, rectovaginal fistula.  Assessment & Plan:   Principal Problem:   Acute cystitis Active Problems:   Bilateral hydronephrosis   Nausea and vomiting   Rectal adenocarcinoma s/p LAR resection 05/25/2015   Essential hypertension   Anemia of chronic disease   Colostomy in place [facility])   Chronic pain syndrome   Acquired hypothyroidism   GERD (gastroesophageal reflux disease)   Hyperlipidemia   Seizure disorder (HCC)   AKI (acute kidney injury)   Bladder spasms   Class 2 obesity   Complicated UTI (urinary tract infection)  Severe sepsis secondary to acute cystitis Complicated UTI (urinary tract infection)  Bladder spasms Bilateral hydronephrosis In the setting of leukocytosis, tachycardia, known  infection with elevated lactic acidosis S/P retrograde pyelogram - moderate to severe hydronephrosis bilaterally Continue supportive care, Foley to remain intact overnight  Voiding trial in next 24 hours per urology Continue cefepime  for antibiotic coverage -likely need prolonged course given above Cultures pending   AKI (acute kidney injury) Resolved - at baseline  Nausea and vomiting Resolved - tolerating PO well   Anemia of chronic disease Likely iron  deficiency - unable to tolerate PO iron  in the past - may benefit from IV iron  infusions (defer to PCP) if unable to supplement via diet   Chronic pain syndrome Continue home Nucynta and oral hydromorphone .   Rectal adenocarcinoma s/p LAR resection 05/25/2015 Last month the patient had a presacral mass. This was biopsied by IR, but it was not a good sample. IR working with the patient to obtain another sample.   Chronic colostomy in place [facility]) Continues to have normal output.   Pure hypercholesterolemia Continue rosuvastatin  5 mg p.o. daily.   Hypertension Continue bisoprolol  5 mg p.o. daily.   Acquired hypothyroidism Levothyroxine  50 mcg daily.   GERD (gastroesophageal reflux disease) Famotidine  as needed.   Class 2 obesity Current BMI 37.64 kg/m. Would benefit from lifestyle modifications.   DVT prophylaxis: SCDs Start: 05/01/24 0120 apixaban (ELIQUIS) tablet 5 mg   Code Status:   Code Status: Full Code  Family Communication: None present  Status is: Inpatient  Dispo: The patient is from: Home              Anticipated d/c is to: Home              Anticipated d/c date is: 24 to 48 hours              Patient currently not medically stable for discharge  Consultants:  Urology  Procedures:  Bilateral ureteral stent replacement/exchange  Antimicrobials:  Cefepime   Subjective: No acute issues or events overnight denies nausea vomiting diarrhea constipation headache fevers chills or chest pain.   Occasional abdominal pain noted with movement but transient in nature  Objective: Vitals:   05/02/24 0205 05/02/24 0410 05/02/24 0410 05/02/24 0420  BP:   (!) 115/92   Pulse:   (!) 55   Resp: 17 18 16    Temp:   98.3 F (36.8 C)   TempSrc:   Oral   SpO2:   100%   Weight:    124.5 kg  Height:        Intake/Output Summary (Last 24 hours) at 05/02/2024 0810 Last data filed at 05/02/2024 0420 Gross per 24 hour  Intake 3968.44 ml  Output 1950 ml  Net 2018.44 ml   Filed Weights   05/01/24 1242 05/02/24 0420  Weight: 122.4 kg 124.5 kg    Examination:  General:  Pleasantly resting in bed, No acute distress. HEENT:  Normocephalic atraumatic.  Sclerae nonicteric, noninjected.  Extraocular movements intact bilaterally. Neck:  Without mass or deformity.  Trachea is midline. Lungs:  Clear to auscultate bilaterally without rhonchi, wheeze, or rales. Heart:  Regular rate and rhythm.  Without murmurs, rubs, or gallops. Abdomen:  Soft, nontender, nondistended.  Without guarding or rebound. Extremities: Without cyanosis, clubbing, edema, or obvious deformity. Skin:  Warm and dry, no erythema.   Data Reviewed: I have personally reviewed following labs and imaging studies  CBC: Recent Labs  Lab 04/30/24 1839 05/01/24 0456 05/01/24 0520 05/02/24 0427  WBC 21.8* 12.8*  --  11.7*  NEUTROABS 18.9* 10.5*  --   --   HGB 12.0 9.3* 9.9* 7.7*  HCT 39.5 31.3* 29.0* 26.1*  MCV 80.8 81.3  --  82.1  PLT 528* 364  --  343   Basic Metabolic Panel: Recent Labs  Lab 04/30/24 1839 05/01/24 0456 05/01/24 0520 05/02/24 0427  NA 138 138 139 137  K 4.0 4.3 4.3 4.1  CL 101 103 104 105  CO2 21* 23  --  21*  GLUCOSE 144* 112* 112* 121*  BUN 17 17 16 20   CREATININE 1.34* 1.30* 1.50* 1.23*  CALCIUM  11.1* 9.8  --  9.2  MG  --  1.8  --   --   PHOS  --  4.0  --   --    GFR: Estimated Creatinine Clearance: 73.5 mL/min (A) (by C-G formula based on SCr of 1.23 mg/dL (H)). Liver Function  Tests: Recent Labs  Lab 04/30/24 1839 05/01/24 0456 05/02/24 0427  AST 14* 11* <10*  ALT 7 <5 <5  ALKPHOS 100 83 68  BILITOT 0.5 0.5 0.3  PROT 9.7* 7.3 6.6  ALBUMIN 4.3 3.3* 3.2*   No results for input(s): LIPASE, AMYLASE in the last 168 hours. No results for input(s): AMMONIA in the last 168 hours. Coagulation Profile: Recent Labs  Lab 04/30/24 1839  INR 1.0   Sepsis Labs: Recent Labs  Lab 04/30/24 1955 04/30/24 2207 05/01/24 0731  LATICACIDVEN 2.3* 2.2* 1.0    Recent Results (from the past 240 hours)  Blood Culture (routine x 2)     Status: None (Preliminary result)   Collection Time: 04/30/24  6:25 PM   Specimen: BLOOD  Result Value Ref Range Status   Specimen Description   Final    BLOOD RIGHT ANTECUBITAL Performed at [facility], [address] [place], [place] [address]    Special Requests   Final  BOTTLES DRAWN AEROBIC AND ANAEROBIC Blood Culture adequate volume Performed at [facility], [address] [place], [place] [address]    Culture   Final    NO GROWTH < 12 HOURS Performed at [facility], [address], [place], [place] [address]    Report Status PENDING  Incomplete  Blood Culture (routine x 2)     Status: None (Preliminary result)   Collection Time: 04/30/24  7:35 PM   Specimen: BLOOD RIGHT HAND  Result Value Ref Range Status   Specimen Description   Final    BLOOD RIGHT HAND Performed at [facility], [address], [place], [place] [address]    Special Requests   Final    BOTTLES DRAWN AEROBIC ONLY Blood Culture results may not be optimal due to an inadequate volume of blood received in culture bottles Performed at [facility], [address], [place], [place] [address]    Culture   Final    NO GROWTH < 12 HOURS Performed at [facility], [address] [place], [place] [address]    Report Status PENDING  Incomplete         Radiology  Studies: DG C-Arm 1-60 Min-No Report Result Date: 05/01/2024 Fluoroscopy was utilized by the requesting physician.  No radiographic interpretation.   CT ABDOMEN PELVIS W CONTRAST Result Date: 04/30/2024 EXAM: CT ABDOMEN AND PELVIS WITH CONTRAST 04/30/2024 09:11:35 PM TECHNIQUE: CT of the abdomen and pelvis was performed with the administration of 100 mL of iohexol  (OMNIPAQUE ) 300 MG/ML solution. Multiplanar reformatted images are provided for review. Automated exposure control, iterative reconstruction, and/or weight-based adjustment of the mA/kV was utilized to reduce the radiation dose to as low as reasonably achievable. COMPARISON: CT 03/21/2024 CLINICAL HISTORY: Abdominal pain, acute, nonlocalized. N/V/D for 2 weeks. Weakness. Tachy in the 130s. Not able to take her meds due to illness. FINDINGS: LOWER CHEST: No acute abnormality. LIVER: The liver is unremarkable. GALLBLADDER AND BILE DUCTS: Gallbladder is unremarkable. No biliary ductal dilatation. SPLEEN: No acute abnormality. PANCREAS: No acute abnormality. ADRENAL GLANDS: No acute abnormality. KIDNEYS, URETERS AND BLADDER: Bilateral ureteral stents are unchanged in position from 03/21/2024. Moderate bilateral hydronephrosis is similar. Similar urothelial thickening and periureteral stranding about both ureters. Delayed left nephrogram. No stones in the kidneys or ureters. Urinary bladder is unremarkable. GI AND BOWEL: Distal colectomy with left abdominal wall colostomy. Pelvic enterotomy. Stomach demonstrates no acute abnormality. There is no bowel obstruction. PERITONEUM AND RETROPERITONEUM: Similar to slight decrease in size in the presacral heterogeneous collection measuring 5.9 x 8.3 cm today, previously 7.0 x 8.6 cm. Mild adjacent stranding and free fluid. No free air. VASCULATURE: Aorta is normal in caliber. LYMPH NODES: Slightly increased size of a right iliac lymph node on series 2 image 68 measuring 12 mm, previously 10 mm. REPRODUCTIVE  ORGANS: Suspected left hydrosalpinx is unchanged. Hysterectomy. BONES AND SOFT TISSUES: Posterior fusion L4-L5 with interbody spacer. No acute osseous abnormality. No focal soft tissue abnormality. IMPRESSION: 1. Moderate bilateral hydronephrosis with bilateral ureteral stents and associated urothelial thickening and periureteral stranding, overall unchanged from prior study. 2. Presacral heterogeneous collection or mass with mild adjacent stranding and free fluid, slightly decreased in size from prior study. 3. Right iliac lymph node mildly enlarged to 12 mm from 10 mm. Electronically signed by: Norman Gatlin MD 04/30/2024 09:55 PM EDT RP Workstation: [id number]   DG Chest Port 1 View Result Date: 04/30/2024 CLINICAL DATA:  Sepsis, abdominal pain  EXAM: PORTABLE CHEST 1 VIEW COMPARISON:  02/23/2023 FINDINGS: The heart size and mediastinal contours are within normal limits. Both lungs are clear. The visualized skeletal structures are unremarkable. IMPRESSION: No active disease. Electronically Signed   By: Ozell Daring M.D.   On: 04/30/2024 19:43        Scheduled Meds:  apixaban  5 mg Oral BID   bisoprolol   5 mg Oral Daily   DULoxetine   120 mg Oral QHS   levETIRAcetam   500 mg Oral BID   levothyroxine   50 mcg Oral Q0600   magnesium  oxide  400 mg Oral BID   oxybutynin   15 mg Oral QHS   polyethylene glycol  17 g Oral Daily   pregabalin   225 mg Oral BID   rosuvastatin   5 mg Oral QPM   tamsulosin   0.4 mg Oral Daily   tapentadol  150 mg Oral Q12H   traZODone   25-50 mg Oral QHS   Continuous Infusions:  ceFEPime  (MAXIPIME ) IV 2 g (05/02/24 0204)   sodium chloride        LOS: 1 day   Time spent:  Elsie JAYSON Montclair, DO [facility]  If 7PM-7AM, please contact night-coverage [email or web address]  05/02/2024, 8:10 AM

## 2024-05-02 NOTE — Progress Notes (Signed)
 Urology Inpatient Progress Report   Intv/Subj: No acute events overnight. B/l stent exchange yesterday.  Cr improving.  Principal Problem:   Acute cystitis Active Problems:   Rectal adenocarcinoma s/p LAR resection 05/25/2015   Essential hypertension   Anemia of chronic disease   Colostomy in place [facility])   Chronic pain syndrome   Acquired hypothyroidism   GERD (gastroesophageal reflux disease)   Bilateral hydronephrosis   Hyperlipidemia   Seizure disorder (HCC)   Nausea and vomiting   AKI (acute kidney injury)   Bladder spasms   Class 2 obesity   Complicated UTI (urinary tract infection)  Current Facility-Administered Medications  Medication Dose Route Frequency Provider Last Rate Last Admin   acetaminophen  (TYLENOL ) tablet 650 mg  650 mg Oral Q6H PRN Howerter, Justin B, DO       Or   acetaminophen  (TYLENOL ) suppository 650 mg  650 mg Rectal Q6H PRN Howerter, Justin B, DO       apixaban (ELIQUIS) tablet 5 mg  5 mg Oral BID Wofford, Drew A, RPH       bisoprolol  (ZEBETA ) tablet 5 mg  5 mg Oral Daily Celinda Alm Lot, MD   5 mg at 05/01/24 2051   ceFEPIme  (MAXIPIME ) 2 g in sodium chloride  0.9 % 100 mL IVPB  2 g Intravenous Q8H Celinda Alm Lot, MD 200 mL/hr at 05/02/24 0204 2 g at 05/02/24 0204   DULoxetine  (CYMBALTA ) DR capsule 120 mg  120 mg Oral QHS Celinda Alm Lot, MD   120 mg at 05/01/24 2145   famotidine  (PEPCID ) tablet 20 mg  20 mg Oral BID PRN Celinda Alm Lot, MD       HYDROmorphone  (DILAUDID ) tablet 4 mg  4 mg Oral Q6H PRN Celinda Alm Lot, MD   4 mg at 05/02/24 0513   hyoscyamine  (LEVSIN  SL) SL tablet 0.125 mg  0.125 mg Sublingual TID PRN Mark Bard LABOR, RPH   0.125 mg at 05/02/24 0410   levETIRAcetam  (KEPPRA ) tablet 500 mg  500 mg Oral BID Celinda Alm Lot, MD   500 mg at 05/01/24 2051   levothyroxine  (SYNTHROID ) tablet 50 mcg  50 mcg Oral Q0600 Celinda Alm Lot, MD   50 mcg at 05/02/24 9486   magnesium  oxide (MAG-OX) tablet 400 mg  400 mg Oral  BID Celinda Alm Lot, MD   400 mg at 05/01/24 2145   melatonin tablet 3 mg  3 mg Oral QHS PRN Howerter, Justin B, DO       ondansetron  (ZOFRAN ) injection 4 mg  4 mg Intravenous Q6H PRN Howerter, Justin B, DO   4 mg at 05/01/24 2144   Oral care mouth rinse  15 mL Mouth Rinse PRN Celinda Alm Lot, MD       oxybutynin  (DITROPAN -XL) 24 hr tablet 15 mg  15 mg Oral QHS Celinda Alm Lot, MD   15 mg at 05/01/24 2145   polyethylene glycol (MIRALAX  / GLYCOLAX ) packet 17 g  17 g Oral Daily Celinda Alm Lot, MD       pregabalin  (LYRICA ) capsule 225 mg  225 mg Oral BID Celinda Alm Lot, MD   225 mg at 05/01/24 2145   rosuvastatin  (CRESTOR ) tablet 5 mg  5 mg Oral QPM Celinda Alm Lot, MD   5 mg at 05/01/24 2051   sodium chloride  0.9 % bolus 1,000 mL  1,000 mL Intravenous Once Celinda Alm Lot, MD       tamsulosin  (FLOMAX ) capsule 0.4 mg  0.4 mg Oral Daily Celinda Alm  Dorn, MD   0.4 mg at 05/01/24 2051   tapentadol (NUCYNTA) 12 hr tablet 150 mg  150 mg Oral Q12H Mark Bard LABOR, RPH   150 mg at 05/01/24 2145   traZODone  (DESYREL ) tablet 25-50 mg  25-50 mg Oral QHS Celinda Alm Dorn, MD   50 mg at 05/01/24 2146     Objective: Vital: Vitals:   05/02/24 0205 05/02/24 0410 05/02/24 0410 05/02/24 0420  BP:   (!) 115/92   Pulse:   (!) 55   Resp: 17 18 16    Temp:   98.3 F (36.8 C)   TempSrc:   Oral   SpO2:   100%   Weight:    124.5 kg  Height:       I/Os: I/O last 3 completed shifts: In: 7408.4 [P.O.:1920; I.V.:2984.1; IV Piggyback:2504.3] Out: 1950 [Urine:1800; Stool:150]  Physical Exam:  General: Patient is in no apparent distress Lungs: Normal respiratory effort, chest expands symmetrically. Foley: draining clear yellow urine Ext: lower extremities symmetric  Lab Results: Recent Labs    04/30/24 1839 05/01/24 0456 05/01/24 0520 05/02/24 0427  WBC 21.8* 12.8*  --  11.7*  HGB 12.0 9.3* 9.9* 7.7*  HCT 39.5 31.3* 29.0* 26.1*   Recent Labs    04/30/24 1839  05/01/24 0456 05/01/24 0520 05/02/24 0427  NA 138 138 139 137  K 4.0 4.3 4.3 4.1  CL 101 103 104 105  CO2 21* 23  --  21*  GLUCOSE 144* 112* 112* 121*  BUN 17 17 16 20   CREATININE 1.34* 1.30* 1.50* 1.23*  CALCIUM  11.1* 9.8  --  9.2   Recent Labs    04/30/24 1839  INR 1.0   No results for input(s): LABURIN in the last 72 hours. Results for orders placed or performed during the hospital encounter of 04/30/24  Blood Culture (routine x 2)     Status: None (Preliminary result)   Collection Time: 04/30/24  6:25 PM   Specimen: BLOOD  Result Value Ref Range Status   Specimen Description   Final    BLOOD RIGHT ANTECUBITAL Performed at [facility], [address], [place], [place] [address]    Special Requests   Final    BOTTLES DRAWN AEROBIC AND ANAEROBIC Blood Culture adequate volume Performed at [facility], [address] [place], [place] [address]    Culture   Final    NO GROWTH < 12 HOURS Performed at [facility], [address] [place], [place] [address]    Report Status PENDING  Incomplete  Blood Culture (routine x 2)     Status: None (Preliminary result)   Collection Time: 04/30/24  7:35 PM   Specimen: BLOOD RIGHT HAND  Result Value Ref Range Status   Specimen Description   Final    BLOOD RIGHT HAND Performed at [facility], [address], [place], [place] [address]    Special Requests   Final    BOTTLES DRAWN AEROBIC ONLY Blood Culture results may not be optimal due to an inadequate volume of blood received in culture bottles Performed at [facility], [address], [place], [place] [address]    Culture   Final    NO GROWTH < 12 HOURS Performed at [facility], [address] [place], [place] [address]    Report Status PENDING  Incomplete    Studies/Results: DG C-Arm 1-60 Min-No Report Result Date: 05/01/2024 Fluoroscopy was utilized by the requesting  physician.  No  radiographic interpretation.   CT ABDOMEN PELVIS W CONTRAST Result Date: 04/30/2024 EXAM: CT ABDOMEN AND PELVIS WITH CONTRAST 04/30/2024 09:11:35 PM TECHNIQUE: CT of the abdomen and pelvis was performed with the administration of 100 mL of iohexol  (OMNIPAQUE ) 300 MG/ML solution. Multiplanar reformatted images are provided for review. Automated exposure control, iterative reconstruction, and/or weight-based adjustment of the mA/kV was utilized to reduce the radiation dose to as low as reasonably achievable. COMPARISON: CT 03/21/2024 CLINICAL HISTORY: Abdominal pain, acute, nonlocalized. N/V/D for 2 weeks. Weakness. Tachy in the 130s. Not able to take her meds due to illness. FINDINGS: LOWER CHEST: No acute abnormality. LIVER: The liver is unremarkable. GALLBLADDER AND BILE DUCTS: Gallbladder is unremarkable. No biliary ductal dilatation. SPLEEN: No acute abnormality. PANCREAS: No acute abnormality. ADRENAL GLANDS: No acute abnormality. KIDNEYS, URETERS AND BLADDER: Bilateral ureteral stents are unchanged in position from 03/21/2024. Moderate bilateral hydronephrosis is similar. Similar urothelial thickening and periureteral stranding about both ureters. Delayed left nephrogram. No stones in the kidneys or ureters. Urinary bladder is unremarkable. GI AND BOWEL: Distal colectomy with left abdominal wall colostomy. Pelvic enterotomy. Stomach demonstrates no acute abnormality. There is no bowel obstruction. PERITONEUM AND RETROPERITONEUM: Similar to slight decrease in size in the presacral heterogeneous collection measuring 5.9 x 8.3 cm today, previously 7.0 x 8.6 cm. Mild adjacent stranding and free fluid. No free air. VASCULATURE: Aorta is normal in caliber. LYMPH NODES: Slightly increased size of a right iliac lymph node on series 2 image 68 measuring 12 mm, previously 10 mm. REPRODUCTIVE ORGANS: Suspected left hydrosalpinx is unchanged. Hysterectomy. BONES AND SOFT TISSUES: Posterior fusion L4-L5 with interbody  spacer. No acute osseous abnormality. No focal soft tissue abnormality. IMPRESSION: 1. Moderate bilateral hydronephrosis with bilateral ureteral stents and associated urothelial thickening and periureteral stranding, overall unchanged from prior study. 2. Presacral heterogeneous collection or mass with mild adjacent stranding and free fluid, slightly decreased in size from prior study. 3. Right iliac lymph node mildly enlarged to 12 mm from 10 mm. Electronically signed by: Norman Gatlin MD 04/30/2024 09:55 PM EDT RP Workstation: [id number]   DG Chest Port 1 View Result Date: 04/30/2024 CLINICAL DATA:  Sepsis, abdominal pain EXAM: PORTABLE CHEST 1 VIEW COMPARISON:  02/23/2023 FINDINGS: The heart size and mediastinal contours are within normal limits. Both lungs are clear. The visualized skeletal structures are unremarkable. IMPRESSION: No active disease. Electronically Signed   By: Ozell Daring M.D.   On: 04/30/2024 19:43    Assessment/Plan: Bilateral ureteral obstruction  -POD1 s/p bilateral ureteral stent exchange with downtrending Cr -foley for maximal drainage due to signs of infection with AKI can be removed today -will need outpatient follow up to discuss stent exchange   Valli Shank, MD Urology 05/02/2024, 7:30 AM

## 2024-05-02 NOTE — Evaluation (Signed)
 Occupational Therapy Evaluation and Discharge Patient Details Name: Lindsay Little MRN: [id number] DOB: 10-01-1965 Today's Date: 05/02/2024   History of Present Illness   Pt admitted from home with bil hydronephrosis and now s/p bil ureteral stent exchange.  Pt with hx of Rectal CA, bil obstructive neuropathy and chemo induced neuropathy     Clinical Impressions Pt is typically modified independent in self care and walks with a RW. She lives with her husband who is bedridden and 2 adult children who assist with IADL. Pt presents with generalized weakness, chronic back and lower abdominal pain and impaired balance. She is near her baseline in ADLs and mobility. Recommending ADLs with nursing staff. Pt is hopeful to discharge home after removal of foley later today.      If plan is discharge home, recommend the following:   Assistance with cooking/housework;Assist for transportation;Help with stairs or ramp for entrance     Functional Status Assessment   Patient has had a recent decline in their functional status and demonstrates the ability to make significant improvements in function in a reasonable and predictable amount of time.     Equipment Recommendations   None recommended by OT     Recommendations for Other Services         Precautions/Restrictions   Precautions Precautions: Fall Recall of Precautions/Restrictions: Intact Restrictions Weight Bearing Restrictions Per Provider Order: No     Mobility Bed Mobility Overal bed mobility: Modified Independent Bed Mobility: Supine to Sit                Transfers Overall transfer level: Modified independent Equipment used: Rolling walker (2 wheels)                      Balance     Sitting balance-Leahy Scale: Good     Standing balance support: No upper extremity supported Standing balance-Leahy Scale: Fair                             ADL either performed or assessed with  clinical judgement   ADL Overall ADL's : Modified independent                                             Vision Baseline Vision/History: 1 Wears glasses Ability to See in Adequate Light: 0 Adequate Patient Visual Report: No change from baseline       Perception         Praxis         Pertinent Vitals/Pain Pain Assessment Pain Assessment: 0-10 Pain Score: 5  Pain Location: back, low abdomen Pain Descriptors / Indicators: Aching, Discomfort Pain Intervention(s): Monitored during session, Repositioned     Extremity/Trunk Assessment Upper Extremity Assessment Upper Extremity Assessment: Overall WFL for tasks assessed;Right hand dominant   Lower Extremity Assessment Lower Extremity Assessment: Defer to PT evaluation RLE Sensation: history of peripheral neuropathy LLE Sensation: history of peripheral neuropathy   Cervical / Trunk Assessment Cervical / Trunk Assessment: Other exceptions (chronic back and abdominal pain)   Communication Communication Communication: No apparent difficulties   Cognition Arousal: Alert Behavior During Therapy: WFL for tasks assessed/performed Cognition: No apparent impairments  Following commands: Intact       Cueing  General Comments   Cueing Techniques: Verbal cues      Exercises     Shoulder Instructions      Home Living Family/patient expects to be discharged to:: Private residence Living Arrangements: Spouse/significant other;Children Available Help at Discharge: Family;Available 24 hours/day Type of Home: House Home Access: Ramped entrance;Stairs to enter Entrance Stairs-Number of Steps: 5 Entrance Stairs-Rails: Right;Left;Can reach both Home Layout: One level     Bathroom Shower/Tub: Producer, television/film/video: Handicapped height Bathroom Accessibility: Yes   Home Equipment: Rollator (4 wheels);Rolling Walker (2 wheels);Cane - single  point;Shower seat - built in;Hand held shower head;Grab bars - tub/shower;Grab bars - toilet          Prior Functioning/Environment Prior Level of Function : Needs assist             Mobility Comments: walks with RW, can drive--hasn't lately ADLs Comments: family assists with meals, housekeeping    OT Problem List:     OT Treatment/Interventions:        OT Goals(Current goals can be found in the care plan section)       OT Frequency:       Co-evaluation   Reason for Co-Treatment: To address functional/ADL transfers PT goals addressed during session: Mobility/safety with mobility OT goals addressed during session: ADL's and self-care      AM-PAC OT 6 Clicks Daily Activity     Outcome Measure Help from another person eating meals?: None Help from another person taking care of personal grooming?: None Help from another person toileting, which includes using toliet, bedpan, or urinal?: None Help from another person bathing (including washing, rinsing, drying)?: None Help from another person to put on and taking off regular upper body clothing?: None Help from another person to put on and taking off regular lower body clothing?: None 6 Click Score: 24   End of Session Equipment Utilized During Treatment: Rolling walker (2 wheels) Nurse Communication: Patient requests pain meds  Activity Tolerance: Patient tolerated treatment well Patient left: in chair;with call bell/phone within reach  OT Visit Diagnosis: Pain;Other abnormalities of gait and mobility (R26.89)                Time: 1130-1156 OT Time Calculation (min): 26 min Charges:  OT General Charges $OT Visit: 1 Visit OT Evaluation $OT Eval Low Complexity: 1 Low  [name], OTR/L Acute Rehabilitation Services Office: [phone]   Kennth Mliss Helling 05/02/2024, 2:56 PM

## 2024-05-02 NOTE — Discharge Summary (Signed)
 Physician Discharge Summary  Lindsay Little FMW:[id number] DOB: [birth year] DOA: 04/30/2024  PCP: Teresa Channel, MD  Admit date: 04/30/2024 Discharge date: 05/03/2024  Admitted From: Home Disposition: Home  Recommendations for Outpatient Follow-up:  Follow up with PCP in 1-2 weeks Follow-up with urology as scheduled  Home Health: None Equipment/Devices: None  Discharge Condition: Stable CODE STATUS: Full Diet recommendation: Low-salt low-fat diet  Brief/Interim Summary: Lindsay Little is a 58 y.o. female presents with presented to the emergency department with complaints of generalized weakness, nausea, vomiting and diarrhea for the past 2 weeks. 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  Urology consulted given patient's prior history of ureteral stents required ureteral stent exchange bilaterally.  Patient's Foley was maintained for 24 hours, once removed patient did have notable urine output without any complication.  Patient's repeat labs were reassuring given stable but minimally elevated creatinine  consistent with her labs over the past few weeks, unclear if this is new baseline of CKD 2 versus smoldering AKI in the setting of complicated UTI and hydronephrosis.  Regardless patient appears quite well stable and agreeable for discharge home, close follow-up with PCP and urology as scheduled.  Continue medications as outlined below.  Discharge Diagnoses:  Principal Problem:   Acute cystitis Active Problems:   Bilateral hydronephrosis   Nausea and vomiting   Rectal adenocarcinoma s/p LAR resection 05/25/2015   Essential hypertension   Anemia of chronic disease   Colostomy in [facility])   Chronic pain syndrome   Acquired hypothyroidism   GERD (gastroesophageal reflux disease)   Hyperlipidemia   Seizure disorder (HCC)   AKI (acute kidney injury)   Bladder spasms   Class 2 obesity   Complicated UTI (urinary tract infection)  Acute cystitis Complicated UTI (urinary tract infection)  Bladder spasms Bilateral hydronephrosis Right retrograde pyelogram shows moderate to severe hydronephrosis to distal ureter Left retrograde pyelogram shows moderate to severe hydronephrosis to distal ureter Continue cefadroxil  to complete 7-day course    AKI (acute kidney injury) Versus new onset CKD 2, creatinine continues to be minimally elevated (recent baseline around 1.4 over the past few months, previously within normal limits)   Nausea and vomiting Resolved - tolerating PO well   Anemia of chronic disease Likely iron  deficiency - unable to tolerate PO iron  in the past - may benefit from IV iron  infusions (defer to PCP) if unable to supplement via diet   Chronic pain syndrome Continue home Nucynta and oral hydromorphone .  Rectal adenocarcinoma s/p LAR resection 05/25/2015 Last month the patient had a presacral mass. This was biopsied by IR, but it was not a good sample. IR working with the patient to obtain another sample.   Chronic colostomy in place [facility]) Continues to have normal  output.   Pure hypercholesterolemia Continue rosuvastatin  5 mg p.o.  daily.   Hypertension Continue bisoprolol  5 mg p.o. daily.   Acquired hypothyroidism Levothyroxine  50 mcg daily.   GERD (gastroesophageal reflux disease) Famotidine  as needed.   Class 2 obesity Current BMI 37.64 kg/m. Would benefit from lifestyle modifications.  Discharge Instructions  Discharge Instructions     Call MD for:  difficulty breathing, headache or visual disturbances   Complete by: As directed    Call MD for:  extreme fatigue   Complete by: As directed    Call MD for:  hives   Complete by: As directed    Call MD for:  persistant dizziness or light-headedness   Complete by: As directed    Call MD for:  persistant nausea and vomiting   Complete by: As directed    Call MD for:  redness, tenderness, or signs of infection (pain, swelling, redness, odor or green/yellow discharge around incision site)   Complete by: As directed    Call MD for:  severe uncontrolled pain   Complete by: As directed    Call MD for:  temperature >100.4   Complete by: As directed    Diet - low sodium heart healthy   Complete by: As directed    Discharge instructions   Complete by: As directed    Follow up with urology as scheduled -call office if any changes/discharge/pain or difficulty with urination.   Increase activity slowly   Complete by: As directed       Allergies as of 05/03/2024       Reactions   Lidocaine  Swelling, Dermatitis, Rash, Other (See Comments)   Eyes swell shut; includes all -caine drugs except marcaine . EMLA  cream OK though   Bupropion Other (See Comments)   Not effective   Metronidazole  Nausea And Vomiting, Other (See Comments)   GI Intolerance and sleeplessness   Oxycodone  Other (See Comments)   NIGHTMARES. (tolerates hydrocodone  or tramadol  better)   Penicillins Nausea Only, Dermatitis, Rash   Sulfa Antibiotics Nausea And Vomiting, Dermatitis, Rash, Other (See Comments)    Pantoprazole  Other (See Comments)   not effective   Adhesive [tape] Rash, Other (See Comments)   Blisters - can use paper tape   Doxycycline  Nausea Only, Other (See Comments)   GI Intolerance   Iron  Nausea Only, Other (See Comments)   GI Intolerance   Metoprolol  Nausea Only, Palpitations   Naltrexone-bupropion Hcl Er Palpitations, Other (See Comments)   Nightmares, too   Wound Dressing Adhesive Other (See Comments), Rash, Dermatitis   Blisters - can use paper tape        Medication List     STOP taking these medications    esomeprazole  40 MG capsule Commonly known as: NEXIUM        TAKE these medications    apixaban 5 MG Tabs tablet Commonly known as: ELIQUIS Take 1 tablet (5 mg total) by mouth 2 (two) times daily. What changed: when to take this   bisoprolol  5 MG tablet Commonly known as: ZEBETA  Take 5 mg by mouth daily.   cefadroxil  500 MG capsule Commonly known as: DURICEF Take 2 capsules (1,000 mg total) by mouth 2 (two) times daily for 3 days. What changed: how much to take   DULoxetine  60 MG capsule Commonly known as: CYMBALTA  Take 120 mg by mouth at bedtime.   famotidine  20 MG tablet Commonly known as: PEPCID  Take 20 mg by mouth 2 (two) times daily as needed for heartburn or indigestion.   furosemide  20 MG tablet Commonly known as: LASIX  Take 20  mg by mouth daily as needed for edema.   HYDROmorphone  4 MG tablet Commonly known as: DILAUDID  Take one tablet (4 mg dose) by mouth every 6 (six) hours as needed for Pain for up to 30 days. Max Daily Amount: 16 mg What changed: when to take this   hyoscyamine  0.125 MG tablet Commonly known as: LEVSIN  Take 0.125 mg by mouth 3 (three) times daily as needed for cramping.   Ibuprofen 200 MG Caps Take 400 mg by mouth every 6 (six) hours as needed (for pain).   levETIRAcetam  250 MG tablet Commonly known as: KEPPRA  Take 500 mg by mouth in the morning and at bedtime.   levothyroxine  50 MCG  tablet Commonly known as: SYNTHROID  Take 50 mcg by mouth daily before breakfast.   magnesium  oxide 400 MG tablet Commonly known as: MAG-OX Take 400 mg by mouth 2 (two) times daily.   melatonin 3 MG Tabs tablet Take 3-6 mg by mouth at bedtime as needed (sleep).   naloxone  4 MG/0.1ML Liqd nasal spray kit Commonly known as: NARCAN  Place 0.4 mg into the nose once as needed (for a crisis).   Nucynta ER 150 MG Tb12 Generic drug: Tapentadol HCl Take 150 mg by mouth every 12 (twelve) hours.   oxybutynin  15 MG 24 hr tablet Commonly known as: DITROPAN  XL Take 15 mg by mouth at bedtime.   polyethylene glycol 17 g packet Commonly known as: MIRALAX  / GLYCOLAX  Take 17 g by mouth See admin instructions. Mix 17 grams into the recommended amount of a beverage and drink once a day- may take an additional 17 grams once a day as needed for constipation   pregabalin  225 MG capsule Commonly known as: LYRICA  Take 225 mg by mouth 2 (two) times daily.   promethazine  25 MG tablet Commonly known as: PHENERGAN  Take 1 tablet (25 mg total) by mouth every 6 (six) hours as needed for nausea or vomiting.   rosuvastatin  5 MG tablet Commonly known as: CRESTOR  Take 5 mg by mouth every evening.   tamsulosin  0.4 MG Caps capsule Commonly known as: FLOMAX  Take 0.4 mg by mouth daily.   traZODone  50 MG tablet Commonly known as: DESYREL  Take 25-50 mg by mouth at bedtime.   triamcinolone  lotion 0.1 % Commonly known as: KENALOG  Apply 1 application  topically 2 (two) times daily as needed (for itching- scalp).   TYLENOL  500 MG tablet Generic drug: acetaminophen  Take 1,000 mg by mouth every 6 (six) hours as needed for mild pain (pain score 1-3) or headache.        Allergies  Allergen Reactions   Lidocaine  Swelling, Dermatitis, Rash and Other (See Comments)    Eyes swell shut; includes all -caine drugs except marcaine . EMLA  cream OK though   Bupropion Other (See Comments)    Not effective     Metronidazole  Nausea And Vomiting and Other (See Comments)    GI Intolerance and sleeplessness   Oxycodone  Other (See Comments)    NIGHTMARES. (tolerates hydrocodone  or tramadol  better)   Penicillins Nausea Only, Dermatitis and Rash   Sulfa Antibiotics Nausea And Vomiting, Dermatitis, Rash and Other (See Comments)   Pantoprazole  Other (See Comments)    not effective   Adhesive [Tape] Rash and Other (See Comments)    Blisters - can use paper tape   Doxycycline  Nausea Only and Other (See Comments)    GI Intolerance   Iron  Nausea Only and Other (See Comments)    GI Intolerance   Metoprolol  Nausea Only and Palpitations  Naltrexone-Bupropion Hcl Er Palpitations and Other (See Comments)    Nightmares, too   Wound Dressing Adhesive Other (See Comments), Rash and Dermatitis    Blisters - can use paper tape    Consultations: Urology, Dr. Elisabeth  Procedures/Studies: ECHOCARDIOGRAM COMPLETE Result Date: 05/02/2024    ECHOCARDIOGRAM REPORT   Patient Name:   SHIVA KARIS Date of Exam: 05/02/2024 Medical Rec #:  [id number]         Height:       71.0 in Accession #:    [id number]        Weight:       274.5 lb Date of Birth:  1966-04-16         BSA:          2.412 m Patient Age:    58 years          BP:           115/92 mmHg Patient Gender: F                 HR:           62 bpm. Exam Location:  Inpatient Procedure: 2D Echo and Intracardiac Opacification Agent (Both Spectral and Color            Flow Doppler were utilized during procedure). Indications:    Abnormal ECG, Diastolic Dysfunction  History:        Patient has prior history of Echocardiogram examinations. Risk                 Factors:Hypertension.  Sonographer:    Charmaine Gaskins Referring Phys: [id number] DAVID MANUEL ORTIZ  Sonographer Comments: Image acquisition challenging due to patient body habitus. IMPRESSIONS  1. Left ventricular ejection fraction, by estimation, is 60 to 65%. The left ventricle has normal function. The left ventricle  has no regional wall motion abnormalities. Left ventricular diastolic parameters were normal.  2. Right ventricular systolic function is normal. The right ventricular size is normal.  3. The mitral valve is abnormal. No evidence of mitral valve regurgitation. No evidence of mitral stenosis.  4. The aortic valve is tricuspid. There is mild calcification of the aortic valve. There is mild thickening of the aortic valve. Aortic valve regurgitation is not visualized. Aortic valve sclerosis is present, with no evidence of aortic valve stenosis.  5. The inferior vena cava is normal in size with greater than 50% respiratory variability, suggesting right atrial pressure of 3 mmHg. FINDINGS  Left Ventricle: Left ventricular ejection fraction, by estimation, is 60 to 65%. The left ventricle has normal function. The left ventricle has no regional wall motion abnormalities. Definity contrast agent was given IV to delineate the left ventricular  endocardial borders. Strain was performed and the global longitudinal strain is indeterminate. The left ventricular internal cavity size was normal in size. There is no left ventricular hypertrophy. Left ventricular diastolic parameters were normal. Right Ventricle: The right ventricular size is normal. No increase in right ventricular wall thickness. Right ventricular systolic function is normal. Left Atrium: Left atrial size was normal in size. Right Atrium: Right atrial size was normal in size. Pericardium: There is no evidence of pericardial effusion. Mitral Valve: The mitral valve is abnormal. There is mild thickening of the mitral valve leaflet(s). There is mild calcification of the mitral valve leaflet(s). Mild mitral annular calcification. No evidence of mitral valve regurgitation. No evidence of mitral valve stenosis. Tricuspid Valve: The tricuspid valve is normal in structure. Tricuspid valve regurgitation is  trivial. No evidence of tricuspid stenosis. Aortic Valve: The aortic  valve is tricuspid. There is mild calcification of the aortic valve. There is mild thickening of the aortic valve. Aortic valve regurgitation is not visualized. Aortic valve sclerosis is present, with no evidence of aortic valve stenosis. Aortic valve mean gradient measures 9.0 mmHg. Aortic valve peak gradient measures 14.0 mmHg. Aortic valve area, by VTI measures 2.94 cm. Pulmonic Valve: The pulmonic valve was normal in structure. Pulmonic valve regurgitation is trivial. No evidence of pulmonic stenosis. Aorta: The aortic root is normal in size and structure. Venous: The inferior vena cava is normal in size with greater than 50% respiratory variability, suggesting right atrial pressure of 3 mmHg. IAS/Shunts: No atrial level shunt detected by color flow Doppler. Additional Comments: 3D was performed not requiring image post processing on an independent workstation and was indeterminate.  LEFT VENTRICLE PLAX 2D LVIDd:         4.60 cm   Diastology LVIDs:         2.30 cm   LV e' medial:    5.98 cm/s LV PW:         1.10 cm   LV E/e' medial:  15.4 LV IVS:        1.00 cm   LV e' lateral:   10.30 cm/s LVOT diam:     2.10 cm   LV E/e' lateral: 8.9 LV SV:         102 LV SV Index:   42 LVOT Area:     3.46 cm  RIGHT VENTRICLE RV Basal diam:  2.90 cm RV Mid diam:    2.40 cm RV S prime:     12.50 cm/s LEFT ATRIUM           Index        RIGHT ATRIUM           Index LA diam:      3.70 cm 1.53 cm/m   RA Area:     14.60 cm LA Vol (A2C): 72.7 ml 30.14 ml/m  RA Volume:   33.20 ml  13.76 ml/m LA Vol (A4C): 41.1 ml 17.04 ml/m  AORTIC VALVE AV Area (Vmax):    2.94 cm AV Area (Vmean):   2.80 cm AV Area (VTI):     2.94 cm AV Vmax:           187.00 cm/s AV Vmean:          141.000 cm/s AV VTI:            0.348 m AV Peak Grad:      14.0 mmHg AV Mean Grad:      9.0 mmHg LVOT Vmax:         159.00 cm/s LVOT Vmean:        114.000 cm/s LVOT VTI:          0.295 m LVOT/AV VTI ratio: 0.85  AORTA Ao Root diam: 2.50 cm Ao Asc diam:  3.40 cm  MITRAL VALVE MV Area (PHT): 3.30 cm     SHUNTS MV Decel Time: 230 msec     Systemic VTI:  0.30 m MV E velocity: 91.80 cm/s   Systemic Diam: 2.10 cm MV A velocity: 108.00 cm/s MV E/A ratio:  0.85 Maude Emmer MD Electronically signed by Maude Emmer MD Signature Date/Time: 05/02/2024/4:05:41 PM    Final    DG C-Arm 1-60 Min-No Report Result Date: 05/01/2024 Fluoroscopy was utilized by the requesting physician.  No radiographic interpretation.  CT ABDOMEN PELVIS W CONTRAST Result Date: 04/30/2024 EXAM: CT ABDOMEN AND PELVIS WITH CONTRAST 04/30/2024 09:11:35 PM TECHNIQUE: CT of the abdomen and pelvis was performed with the administration of 100 mL of iohexol  (OMNIPAQUE ) 300 MG/ML solution. Multiplanar reformatted images are provided for review. Automated exposure control, iterative reconstruction, and/or weight-based adjustment of the mA/kV was utilized to reduce the radiation dose to as low as reasonably achievable. COMPARISON: CT 03/21/2024 CLINICAL HISTORY: Abdominal pain, acute, nonlocalized. N/V/D for 2 weeks. Weakness. Tachy in the 130s. Not able to take her meds due to illness. FINDINGS: LOWER CHEST: No acute abnormality. LIVER: The liver is unremarkable. GALLBLADDER AND BILE DUCTS: Gallbladder is unremarkable. No biliary ductal dilatation. SPLEEN: No acute abnormality. PANCREAS: No acute abnormality. ADRENAL GLANDS: No acute abnormality. KIDNEYS, URETERS AND BLADDER: Bilateral ureteral stents are unchanged in position from 03/21/2024. Moderate bilateral hydronephrosis is similar. Similar urothelial thickening and periureteral stranding about both ureters. Delayed left nephrogram. No stones in the kidneys or ureters. Urinary bladder is unremarkable. GI AND BOWEL: Distal colectomy with left abdominal wall colostomy. Pelvic enterotomy. Stomach demonstrates no acute abnormality. There is no bowel obstruction. PERITONEUM AND RETROPERITONEUM: Similar to slight decrease in size in the presacral heterogeneous  collection measuring 5.9 x 8.3 cm today, previously 7.0 x 8.6 cm. Mild adjacent stranding and free fluid. No free air. VASCULATURE: Aorta is normal in caliber. LYMPH NODES: Slightly increased size of a right iliac lymph node on series 2 image 68 measuring 12 mm, previously 10 mm. REPRODUCTIVE ORGANS: Suspected left hydrosalpinx is unchanged. Hysterectomy. BONES AND SOFT TISSUES: Posterior fusion L4-L5 with interbody spacer. No acute osseous abnormality. No focal soft tissue abnormality. IMPRESSION: 1. Moderate bilateral hydronephrosis with bilateral ureteral stents and associated urothelial thickening and periureteral stranding, overall unchanged from prior study. 2. Presacral heterogeneous collection or mass with mild adjacent stranding and free fluid, slightly decreased in size from prior study. 3. Right iliac lymph node mildly enlarged to 12 mm from 10 mm. Electronically signed by: Norman Gatlin MD 04/30/2024 09:55 PM EDT RP Workstation: [id number]   DG Chest Port 1 View Result Date: 04/30/2024 CLINICAL DATA:  Sepsis, abdominal pain EXAM: PORTABLE CHEST 1 VIEW COMPARISON:  02/23/2023 FINDINGS: The heart size and mediastinal contours are within normal limits. Both lungs are clear. The visualized skeletal structures are unremarkable. IMPRESSION: No active disease. Electronically Signed   By: Ozell Daring M.D.   On: 04/30/2024 19:43     Subjective: No acute issues or events overnight denies nausea vomiting diarrhea constipation headache fevers chills or chest pain   Discharge Exam: Vitals:   05/02/24 2118 05/03/24 0553  BP: 100/72 107/74  Pulse: 67 (!) 54  Resp: 16 16  Temp: 98.6 F (37 C) 97.9 F (36.6 C)  SpO2: 97% 96%   Vitals:   05/02/24 1956 05/02/24 2118 05/03/24 0553 05/03/24 0630  BP: 101/69 100/72 107/74   Pulse: 70 67 (!) 54   Resp: 18 16 16    Temp: 98.8 F (37.1 C) 98.6 F (37 C) 97.9 F (36.6 C)   TempSrc: Oral Oral Oral   SpO2: 96% 97% 96%   Weight:    130.4 kg   Height:        General: Pt is alert, awake, not in acute distress Cardiovascular: RRR, S1/S2 +, no rubs, no gallops Respiratory: CTA bilaterally, no wheezing, no rhonchi Abdominal: Soft, NT, ND, bowel sounds + Extremities: no edema, no cyanosis    The results of significant diagnostics from this hospitalization (including imaging,  microbiology, ancillary and laboratory) are listed below for reference.     Microbiology: Recent Results (from the past 240 hours)  Blood Culture (routine x 2)     Status: None (Preliminary result)   Collection Time: 04/30/24  6:25 PM   Specimen: BLOOD  Result Value Ref Range Status   Specimen Description   Final    BLOOD RIGHT ANTECUBITAL Performed at [facility], [address], [place], [place] [address]    Special Requests   Final    BOTTLES DRAWN AEROBIC AND ANAEROBIC Blood Culture adequate volume Performed at [facility], [address] [place], [place] [address]    Culture   Final    NO GROWTH 3 DAYS Performed at [facility], [address] [place], [place] [address]    Report Status PENDING  Incomplete  Blood Culture (routine x 2)     Status: None (Preliminary result)   Collection Time: 04/30/24  7:35 PM   Specimen: BLOOD RIGHT HAND  Result Value Ref Range Status   Specimen Description   Final    BLOOD RIGHT HAND Performed at [facility], [address], [place], [place] [address]    Special Requests   Final    BOTTLES DRAWN AEROBIC ONLY Blood Culture results may not be optimal due to an inadequate volume of blood received in culture bottles Performed at [facility], [address], [place], [place] [address]    Culture   Final    NO GROWTH 3 DAYS Performed at [facility], [address], [place], [place] [address]    Report Status PENDING  Incomplete  Urine Culture     Status: Abnormal   Collection Time: 04/30/24 10:21 PM   Specimen: Urine,  Random  Result Value Ref Range Status   Specimen Description   Final    URINE, RANDOM Performed at [facility], [address] [place], [place] [address]    Special Requests   Final    NONE Reflexed from [id number] Performed at [facility], [address] [place], [place] [address]    Culture (A)  Final    <10,000 COLONIES/mL INSIGNIFICANT GROWTH Performed at [facility], [address], [place], [place] [address]    Report Status 05/02/2024 FINAL  Final     Labs: BNP (last 3 results) No results for input(s): BNP in the last 8760 hours. Basic Metabolic Panel: Recent Labs  Lab 04/30/24 1839 05/01/24 0456 05/01/24 0520 05/02/24 0427 05/03/24 0819  NA 138 138 139 137 141  K 4.0 4.3 4.3 4.1 4.1  CL 101 103 104 105 108  CO2 21* 23  --  21* 23  GLUCOSE 144* 112* 112* 121* 82  BUN 17 17 16 20  22*  CREATININE 1.34* 1.30* 1.50* 1.23* 1.33*  CALCIUM  11.1* 9.8  --  9.2 9.3  MG  --  1.8  --   --   --   PHOS  --  4.0  --   --   --    Liver Function Tests: Recent Labs  Lab 04/30/24 1839 05/01/24 0456 05/02/24 0427  AST 14* 11* <10*  ALT 7 <5 <5  ALKPHOS 100 83 68  BILITOT 0.5 0.5 0.3  PROT 9.7* 7.3 6.6  ALBUMIN 4.3 3.3* 3.2*   No results for input(s): LIPASE, AMYLASE in the last 168 hours. No results for input(s): AMMONIA in the last 168 hours. CBC:  Recent Labs  Lab 04/30/24 1839 05/01/24 0456 05/01/24 0520 05/02/24 0427 05/03/24 0819  WBC 21.8* 12.8*  --  11.7* 8.5  NEUTROABS 18.9* 10.5*  --   --   --   HGB 12.0 9.3* 9.9* 7.7* 7.8*  HCT 39.5 31.3* 29.0* 26.1* 27.3*  MCV 80.8 81.3  --  82.1 83.5  PLT 528* 364  --  343 327   Cardiac Enzymes: No results for input(s): CKTOTAL, CKMB, CKMBINDEX, TROPONINI in the last 168 hours. BNP: Invalid input(s): POCBNP CBG: No results for input(s): GLUCAP in the last 168 hours. D-Dimer No results for input(s): DDIMER in the last 72 hours. Hgb A1c No  results for input(s): HGBA1C in the last 72 hours. Lipid Profile No results for input(s): CHOL, HDL, LDLCALC, TRIG, CHOLHDL, LDLDIRECT in the last 72 hours. Thyroid  function studies No results for input(s): TSH, T4TOTAL, T3FREE, THYROIDAB in the last 72 hours.  Invalid input(s): FREET3 Anemia work up Recent Labs    05/02/24 1231  VITAMINB12 214  FOLATE 6.4  FERRITIN 45  TIBC 315  IRON  17*   Urinalysis    Component Value Date/Time   COLORURINE YELLOW 04/30/2024 2221   APPEARANCEUR CLOUDY (A) 04/30/2024 2221   LABSPEC 1.016 04/30/2024 2221   LABSPEC 1.010 02/26/2016 1604   PHURINE 6.0 04/30/2024 2221   GLUCOSEU NEGATIVE 04/30/2024 2221   GLUCOSEU Negative 02/26/2016 1604   HGBUR SMALL (A) 04/30/2024 2221   BILIRUBINUR NEGATIVE 04/30/2024 2221   BILIRUBINUR Negative 02/26/2016 1604   KETONESUR NEGATIVE 04/30/2024 2221   PROTEINUR >=300 (A) 04/30/2024 2221   UROBILINOGEN 0.2 02/26/2016 1604   NITRITE NEGATIVE 04/30/2024 2221   LEUKOCYTESUR MODERATE (A) 04/30/2024 2221   LEUKOCYTESUR Trace 02/26/2016 1604   Sepsis Labs Recent Labs  Lab 04/30/24 1839 05/01/24 0456 05/02/24 0427 05/03/24 0819  WBC 21.8* 12.8* 11.7* 8.5   Microbiology Recent Results (from the past 240 hours)  Blood Culture (routine x 2)     Status: None (Preliminary result)   Collection Time: 04/30/24  6:25 PM   Specimen: BLOOD  Result Value Ref Range Status   Specimen Description   Final    BLOOD RIGHT ANTECUBITAL Performed at [facility], [address], [place], [place] [address]    Special Requests   Final    BOTTLES DRAWN AEROBIC AND ANAEROBIC Blood Culture adequate volume Performed at [facility], [address], [place], [place] [address]    Culture   Final    NO GROWTH 3 DAYS Performed at [facility], [address], [place], [place] [address]    Report Status PENDING  Incomplete  Blood Culture (routine x 2)      Status: None (Preliminary result)   Collection Time: 04/30/24  7:35 PM   Specimen: BLOOD RIGHT HAND  Result Value Ref Range Status   Specimen Description   Final    BLOOD RIGHT HAND Performed at [facility], [address] [place], [place] [address]    Special Requests   Final    BOTTLES DRAWN AEROBIC ONLY Blood Culture results may not be optimal due to an inadequate volume of blood received in culture bottles Performed at [facility], [address] [place], [place] [address]    Culture   Final    NO GROWTH 3 DAYS Performed at [facility], [address] [place], [place] [address]    Report Status PENDING  Incomplete  Urine Culture  Status: Abnormal   Collection Time: 04/30/24 10:21 PM   Specimen: Urine, Random  Result Value Ref Range Status   Specimen Description   Final    URINE, RANDOM Performed at [facility], [address] [place], [place] [address]    Special Requests   Final    NONE Reflexed from [id number] Performed at [facility], [address], [place], [place] [address]    Culture (A)  Final    <10,000 COLONIES/mL INSIGNIFICANT GROWTH Performed at [facility], [address], [place], [place] [address]    Report Status 05/02/2024 FINAL  Final     Time coordinating discharge: Over 30 minutes  SIGNED:   Elsie JAYSON Montclair, DO [facility] 05/03/2024, 12:09 PM Pager   If 7PM-7AM, please contact night-coverage [email or web address]

## 2024-05-03 ENCOUNTER — Encounter (HOSPITAL_COMMUNITY): Payer: Self-pay | Admitting: Urology

## 2024-05-03 DIAGNOSIS — R112 Nausea with vomiting, unspecified: Secondary | ICD-10-CM | POA: Diagnosis not present

## 2024-05-03 DIAGNOSIS — N179 Acute kidney failure, unspecified: Secondary | ICD-10-CM | POA: Diagnosis not present

## 2024-05-03 DIAGNOSIS — N1330 Unspecified hydronephrosis: Secondary | ICD-10-CM | POA: Diagnosis not present

## 2024-05-03 DIAGNOSIS — N3001 Acute cystitis with hematuria: Secondary | ICD-10-CM | POA: Diagnosis not present

## 2024-05-03 LAB — CBC
HCT: 27.3 % — ABNORMAL LOW (ref 36.0–46.0)
Hemoglobin: 7.8 g/dL — ABNORMAL LOW (ref 12.0–15.0)
MCH: 23.9 pg — ABNORMAL LOW (ref 26.0–34.0)
MCHC: 28.6 g/dL — ABNORMAL LOW (ref 30.0–36.0)
MCV: 83.5 fL (ref 80.0–100.0)
Platelets: 327 K/uL (ref 150–400)
RBC: 3.27 MIL/uL — ABNORMAL LOW (ref 3.87–5.11)
RDW: 18 % — ABNORMAL HIGH (ref 11.5–15.5)
WBC: 8.5 K/uL (ref 4.0–10.5)
nRBC: 0 % (ref 0.0–0.2)

## 2024-05-03 LAB — BASIC METABOLIC PANEL WITH GFR
Anion gap: 10 (ref 5–15)
BUN: 22 mg/dL — ABNORMAL HIGH (ref 6–20)
CO2: 23 mmol/L (ref 22–32)
Calcium: 9.3 mg/dL (ref 8.9–10.3)
Chloride: 108 mmol/L (ref 98–111)
Creatinine, Ser: 1.33 mg/dL — ABNORMAL HIGH (ref 0.44–1.00)
GFR, Estimated: 46 mL/min — ABNORMAL LOW (ref >60)
Glucose, Bld: 82 mg/dL (ref 70–99)
Potassium: 4.1 mmol/L (ref 3.5–5.1)
Sodium: 141 mmol/L (ref 135–145)

## 2024-05-03 MED ORDER — CEFADROXIL 500 MG PO CAPS: 1000.0000 mg | ORAL_CAPSULE | Freq: Two times a day (BID) | ORAL | 0 refills | Status: AC

## 2024-05-03 MED ORDER — APIXABAN 5 MG PO TABS: 5.0000 mg | ORAL_TABLET | Freq: Two times a day (BID) | ORAL | 0 refills | Status: AC

## 2024-05-03 NOTE — Progress Notes (Signed)
   05/03/24 1021  TOC Brief Assessment  Insurance and Status Reviewed  Patient has primary care physician Yes  Home environment has been reviewed home with spouse  Prior level of function: independent  Prior/Current Home Services No current home services  Social Drivers of Health Review SDOH reviewed no interventions necessary  Readmission risk has been reviewed Yes  Transition of care needs no transition of care needs at this time

## 2024-05-03 NOTE — Progress Notes (Signed)
 2 Days Post-Op Subjective: First time meeting Lindsay Little.  Reviewed her history, goals, and plan of care..  Excellent urine output overnight.  Objective: Vital signs in last 24 hours: Temp:  [97.9 F (36.6 C)-98.8 F (37.1 C)] 97.9 F (36.6 C) (10/20 0553) Pulse Rate:  [54-86] 54 (10/20 0553) Resp:  [16-18] 16 (10/20 0553) BP: (100-119)/(64-74) 107/74 (10/20 0553) SpO2:  [96 %-97 %] 96 % (10/20 0553) Weight:  [130.4 kg] 130.4 kg (10/20 0630)  Assessment/Plan: # Bilateral ureteral obstruction  S/p bilateral stent exchange w/ Dr. Elisabeth on 10/18  Some initial downtrending of serum creatinine but stable today.  She has excellent urinary output with 2.2 L over the last 24 hours.  Her AKI was not remarkable and is likely multifactorial, between volume loss and obstructive uropathy.  She reports weeks of diarrhea and N/V prior to her hospitalization.  We again reviewed that repeat stent failure may necessitate bilateral percutaneous nephrostomy tubes.  She is not completely opposed to this as she continues to struggle with urinary incontinence.  Shared decision made to follow-up with PCP and check serum creatinine at least monthly to try to stay in front of any potential stent failure.  Remove Foley catheter today.  Okay to discharge home after voiding.  Follow up for reassessment in 90 days  Intake/Output from previous day: 10/19 0701 - 10/20 0700 In: 600 [P.O.:600] Out: 2200 [Urine:2200]  Intake/Output this shift: No intake/output data recorded.  Physical Exam:  General: Alert and oriented CV: No cyanosis Lungs: equal chest rise Abdomen: Soft, NTND, no rebound or guarding Gu: foley catheter draining clear yellow urine  Lab Results: Recent Labs    05/01/24 0520 05/02/24 0427 05/03/24 0819  HGB 9.9* 7.7* 7.8*  HCT 29.0* 26.1* 27.3*   BMET Recent Labs    05/02/24 0427 05/03/24 0819  NA 137 141  K 4.1 4.1  CL 105 108  CO2 21* 23  GLUCOSE 121* 82  BUN 20 22*   CREATININE 1.23* 1.33*  CALCIUM  9.2 9.3  HGB 7.7* 7.8*  WBC 11.7* 8.5     Studies/Results: ECHOCARDIOGRAM COMPLETE Result Date: 05/02/2024    ECHOCARDIOGRAM REPORT   Patient Name:   Lindsay Little Date of Exam: 05/02/2024 Medical Rec #:  [id number]         Height:       71.0 in Accession #:    [id number]        Weight:       274.5 lb Date of Birth:  1965/12/09         BSA:          2.412 m Patient Age:    58 years          BP:           115/92 mmHg Patient Gender: F                 HR:           62 bpm. Exam Location:  Inpatient Procedure: 2D Echo and Intracardiac Opacification Agent (Both Spectral and Color            Flow Doppler were utilized during procedure). Indications:    Abnormal ECG, Diastolic Dysfunction  History:        Patient has prior history of Echocardiogram examinations. Risk                 Factors:Hypertension.  Sonographer:    Charmaine Gaskins Referring Phys: [phone] DAVID MANUEL  ORTIZ  Sonographer Comments: Image acquisition challenging due to patient body habitus. IMPRESSIONS  1. Left ventricular ejection fraction, by estimation, is 60 to 65%. The left ventricle has normal function. The left ventricle has no regional wall motion abnormalities. Left ventricular diastolic parameters were normal.  2. Right ventricular systolic function is normal. The right ventricular size is normal.  3. The mitral valve is abnormal. No evidence of mitral valve regurgitation. No evidence of mitral stenosis.  4. The aortic valve is tricuspid. There is mild calcification of the aortic valve. There is mild thickening of the aortic valve. Aortic valve regurgitation is not visualized. Aortic valve sclerosis is present, with no evidence of aortic valve stenosis.  5. The inferior vena cava is normal in size with greater than 50% respiratory variability, suggesting right atrial pressure of 3 mmHg. FINDINGS  Left Ventricle: Left ventricular ejection fraction, by estimation, is 60 to 65%. The left ventricle has  normal function. The left ventricle has no regional wall motion abnormalities. Definity contrast agent was given IV to delineate the left ventricular  endocardial borders. Strain was performed and the global longitudinal strain is indeterminate. The left ventricular internal cavity size was normal in size. There is no left ventricular hypertrophy. Left ventricular diastolic parameters were normal. Right Ventricle: The right ventricular size is normal. No increase in right ventricular wall thickness. Right ventricular systolic function is normal. Left Atrium: Left atrial size was normal in size. Right Atrium: Right atrial size was normal in size. Pericardium: There is no evidence of pericardial effusion. Mitral Valve: The mitral valve is abnormal. There is mild thickening of the mitral valve leaflet(s). There is mild calcification of the mitral valve leaflet(s). Mild mitral annular calcification. No evidence of mitral valve regurgitation. No evidence of mitral valve stenosis. Tricuspid Valve: The tricuspid valve is normal in structure. Tricuspid valve regurgitation is trivial. No evidence of tricuspid stenosis. Aortic Valve: The aortic valve is tricuspid. There is mild calcification of the aortic valve. There is mild thickening of the aortic valve. Aortic valve regurgitation is not visualized. Aortic valve sclerosis is present, with no evidence of aortic valve stenosis. Aortic valve mean gradient measures 9.0 mmHg. Aortic valve peak gradient measures 14.0 mmHg. Aortic valve area, by VTI measures 2.94 cm. Pulmonic Valve: The pulmonic valve was normal in structure. Pulmonic valve regurgitation is trivial. No evidence of pulmonic stenosis. Aorta: The aortic root is normal in size and structure. Venous: The inferior vena cava is normal in size with greater than 50% respiratory variability, suggesting right atrial pressure of 3 mmHg. IAS/Shunts: No atrial level shunt detected by color flow Doppler. Additional Comments:  3D was performed not requiring image post processing on an independent workstation and was indeterminate.  LEFT VENTRICLE PLAX 2D LVIDd:         4.60 cm   Diastology LVIDs:         2.30 cm   LV e' medial:    5.98 cm/s LV PW:         1.10 cm   LV E/e' medial:  15.4 LV IVS:        1.00 cm   LV e' lateral:   10.30 cm/s LVOT diam:     2.10 cm   LV E/e' lateral: 8.9 LV SV:         102 LV SV Index:   42 LVOT Area:     3.46 cm  RIGHT VENTRICLE RV Basal diam:  2.90 cm RV Mid diam:  2.40 cm RV S prime:     12.50 cm/s LEFT ATRIUM           Index        RIGHT ATRIUM           Index LA diam:      3.70 cm 1.53 cm/m   RA Area:     14.60 cm LA Vol (A2C): 72.7 ml 30.14 ml/m  RA Volume:   33.20 ml  13.76 ml/m LA Vol (A4C): 41.1 ml 17.04 ml/m  AORTIC VALVE AV Area (Vmax):    2.94 cm AV Area (Vmean):   2.80 cm AV Area (VTI):     2.94 cm AV Vmax:           187.00 cm/s AV Vmean:          141.000 cm/s AV VTI:            0.348 m AV Peak Grad:      14.0 mmHg AV Mean Grad:      9.0 mmHg LVOT Vmax:         159.00 cm/s LVOT Vmean:        114.000 cm/s LVOT VTI:          0.295 m LVOT/AV VTI ratio: 0.85  AORTA Ao Root diam: 2.50 cm Ao Asc diam:  3.40 cm MITRAL VALVE MV Area (PHT): 3.30 cm     SHUNTS MV Decel Time: 230 msec     Systemic VTI:  0.30 m MV E velocity: 91.80 cm/s   Systemic Diam: 2.10 cm MV A velocity: 108.00 cm/s MV E/A ratio:  0.85 Maude Emmer MD Electronically signed by Maude Emmer MD Signature Date/Time: 05/02/2024/4:05:41 PM    Final    DG C-Arm 1-60 Min-No Report Result Date: 05/01/2024 Fluoroscopy was utilized by the requesting physician.  No radiographic interpretation.      LOS: 2 days   Ole Bourdon, NP [facility] Pager: [phone]  05/03/2024, 9:23 AM

## 2024-05-03 NOTE — Progress Notes (Signed)
 Mobility Specialist Progress Note:   05/03/24 1157  Orthostatic Sitting  BP- Sitting 100/73  Pulse- Sitting 94  Orthostatic Standing at 0 minutes  BP- Standing at 0 minutes (!) 70/46  Pulse- Standing at 0 minutes 90  Mobility  Activity Stood at bedside  Level of Assistance Standby assist, set-up cues, supervision of patient - no hands on  Assistive Device Front wheel walker  Activity Response Tolerated poorly  Mobility Referral Yes  Mobility visit 1 Mobility  Mobility Specialist Start Time (ACUTE ONLY) 1143  Mobility Specialist Stop Time (ACUTE ONLY) 1153  Mobility Specialist Time Calculation (min) (ACUTE ONLY) 10 min   Pt was received in recliner and agreed to mobility. Upon standing, became dizzy and lightheaded. Took sitting and standing BP (reported above). Returned to recliner with all needs met. Left in room with RN.  [employer] - Mobility Specialist

## 2024-05-04 DIAGNOSIS — Z5181 Encounter for therapeutic drug level monitoring: Secondary | ICD-10-CM | POA: Diagnosis not present

## 2024-05-04 DIAGNOSIS — M47816 Spondylosis without myelopathy or radiculopathy, lumbar region: Secondary | ICD-10-CM | POA: Diagnosis not present

## 2024-05-04 DIAGNOSIS — G894 Chronic pain syndrome: Secondary | ICD-10-CM | POA: Diagnosis not present

## 2024-05-04 DIAGNOSIS — M961 Postlaminectomy syndrome, not elsewhere classified: Secondary | ICD-10-CM | POA: Diagnosis not present

## 2024-05-04 DIAGNOSIS — M533 Sacrococcygeal disorders, not elsewhere classified: Secondary | ICD-10-CM | POA: Diagnosis not present

## 2024-05-04 DIAGNOSIS — Z79899 Other long term (current) drug therapy: Secondary | ICD-10-CM | POA: Diagnosis not present

## 2024-05-05 LAB — CULTURE, BLOOD (ROUTINE X 2)
Culture: NO GROWTH
Culture: NO GROWTH
Special Requests: ADEQUATE

## 2024-05-05 NOTE — Anesthesia Postprocedure Evaluation (Signed)
 Anesthesia Post Note  Patient: Lindsay Little  Procedure(s) Performed: CYSTOSCOPY, WITH RETROGRADE PYELOGRAM AND URETERAL STENT INSERTION (Bilateral: Ureter)     Patient location during evaluation: PACU Anesthesia Type: General Level of consciousness: awake and alert Pain management: pain level controlled Vital Signs Assessment: post-procedure vital signs reviewed and stable Respiratory status: spontaneous breathing, nonlabored ventilation, respiratory function stable and patient connected to nasal cannula oxygen Cardiovascular status: blood pressure returned to baseline and stable Postop Assessment: no apparent nausea or vomiting Anesthetic complications: no   No notable events documented.  Last Vitals:  Vitals:   05/02/24 2118 05/03/24 0553  BP: 100/72 107/74  Pulse: 67 (!) 54  Resp: 16 16  Temp: 37 C 36.6 C  SpO2: 97% 96%    Last Pain:  Vitals:   05/03/24 0907  TempSrc:   PainSc: 5                  Fryda Molenda L Keriana Sarsfield

## 2024-05-13 DIAGNOSIS — D500 Iron deficiency anemia secondary to blood loss (chronic): Secondary | ICD-10-CM | POA: Diagnosis not present

## 2024-05-13 DIAGNOSIS — R7303 Prediabetes: Secondary | ICD-10-CM | POA: Diagnosis not present

## 2024-05-13 DIAGNOSIS — E559 Vitamin D deficiency, unspecified: Secondary | ICD-10-CM | POA: Diagnosis not present

## 2024-05-13 DIAGNOSIS — I10 Essential (primary) hypertension: Secondary | ICD-10-CM | POA: Diagnosis not present

## 2024-05-19 ENCOUNTER — Telehealth: Payer: Self-pay | Admitting: Pharmacy Technician

## 2024-05-19 ENCOUNTER — Other Ambulatory Visit (HOSPITAL_COMMUNITY): Payer: Self-pay | Admitting: Family Medicine

## 2024-05-19 ENCOUNTER — Encounter: Payer: Self-pay | Admitting: Family Medicine

## 2024-05-19 DIAGNOSIS — D509 Iron deficiency anemia, unspecified: Secondary | ICD-10-CM | POA: Insufficient documentation

## 2024-05-19 DIAGNOSIS — D500 Iron deficiency anemia secondary to blood loss (chronic): Secondary | ICD-10-CM | POA: Insufficient documentation

## 2024-05-19 NOTE — Telephone Encounter (Signed)
 Auth Submission: NO AUTH NEEDED Site of care: Site of care: [facility] Payer: [employer] MEDICARE Medication & CPT/J Code(s) submitted: Feraheme  (ferumoxytol ) [id number] Diagnosis Code: D50.9 Route of submission (phone, fax, portal): PORTAL Phone # Fax # Auth type: Buy/Bill PB Units/visits requested: X2 DOSES Reference number: [id number]  Approval from: 05/5824 to 07/14/24

## 2024-05-25 ENCOUNTER — Other Ambulatory Visit: Payer: Self-pay | Admitting: Urology

## 2024-05-25 ENCOUNTER — Ambulatory Visit

## 2024-05-25 VITALS — BP 96/66 | HR 61 | Temp 98.3°F | Resp 16 | Ht 70.5 in | Wt 277.0 lb

## 2024-05-25 DIAGNOSIS — D509 Iron deficiency anemia, unspecified: Secondary | ICD-10-CM

## 2024-05-25 DIAGNOSIS — D500 Iron deficiency anemia secondary to blood loss (chronic): Secondary | ICD-10-CM | POA: Diagnosis not present

## 2024-05-25 MED ORDER — SODIUM CHLORIDE 0.9 % IV SOLN
510.0000 mg | Freq: Once | INTRAVENOUS | Status: AC
  Administered 2024-05-25: 510 mg via INTRAVENOUS
  Filled 2024-05-25: qty 17

## 2024-05-25 NOTE — Progress Notes (Signed)
 Diagnosis: Iron  Deficiency Anemia  Provider:  Praveen Mannam MD  Procedure: IV Infusion  IV Type: Peripheral, IV Location: R Hand  Feraheme  (Ferumoxytol ), Dose: 510 mg  Infusion Start Time: 1517  Infusion Stop Time: 1549  Post Infusion IV Care: Observation period completed and Peripheral IV Discontinued  Discharge: Condition: Good, Destination: Home . AVS Declined  Performed by:  Aldine Grainger, RN

## 2024-05-26 ENCOUNTER — Other Ambulatory Visit: Payer: Self-pay | Admitting: Urology

## 2024-06-01 ENCOUNTER — Ambulatory Visit

## 2024-06-07 ENCOUNTER — Ambulatory Visit

## 2024-06-07 VITALS — BP 134/67 | HR 70 | Temp 98.4°F | Resp 16 | Ht 70.5 in | Wt 275.0 lb

## 2024-06-07 DIAGNOSIS — D509 Iron deficiency anemia, unspecified: Secondary | ICD-10-CM | POA: Diagnosis not present

## 2024-06-07 DIAGNOSIS — D500 Iron deficiency anemia secondary to blood loss (chronic): Secondary | ICD-10-CM

## 2024-06-07 MED ORDER — SODIUM CHLORIDE 0.9 % IV SOLN
510.0000 mg | Freq: Once | INTRAVENOUS | Status: AC
  Administered 2024-06-07: 510 mg via INTRAVENOUS
  Filled 2024-06-07: qty 17

## 2024-06-07 NOTE — Progress Notes (Signed)
 Diagnosis: , Iron  Deficiency Anemia  Provider:  Praveen Mannam MD  Procedure: IV Infusion  IV Type: Peripheral, IV Location: L Hand  , Feraheme  (Ferumoxytol ), Dose: 510 mg  Infusion Start Time: 1343  Infusion Stop Time: 1359  Post Infusion IV Care: Observation period completed and Peripheral IV Discontinued  Discharge: Condition: Good, Destination: Home . AVS Declined  Performed by:  Sakshi Sermons, RN

## 2024-06-15 NOTE — Progress Notes (Signed)
 COVID Vaccine received:  []  No []  Yes Date of any COVID positive Test in last 90 days:  PCP - Montie Pizza MD Cardiologist -   Chest x-ray - 04/30/24 Epic EKG - 04/30/24 Epic  Stress Test -  ECHO - 05/02/24 Epic Cardiac Cath -   Bowel Prep - []  No  []   Yes ______  Pacemaker / ICD device []  No []  Yes   Spinal Cord Stimulator:[]  No []  Yes       History of Sleep Apnea? []  No []  Yes   CPAP used?- []  No []  Yes    Does the patient monitor blood sugar?          []  No []  Yes  []  N/A  Patient has: []  NO Hx DM   []  Pre-DM                 []  DM1  []   DM2 Does patient have a [employer] or Dexacom? []  No []  Yes   Fasting Blood Sugar Ranges-  Checks Blood Sugar _____ times a day  GLP1 agonist / usual dose -  GLP1 instructions:  SGLT-2 inhibitors / usual dose -  SGLT-2 instructions:   Blood Thinner / Instructions: Aspirin Instructions:  Comments:   Activity level: Patient is able / unable to climb a flight of stairs without difficulty; []  No CP  []  No SOB, but would have ___   Patient can / can not perform ADLs without assistance.   Anesthesia review:   Patient denies shortness of breath, fever, cough and chest pain at PAT appointment.  Patient verbalized understanding and agreement to the Pre-Surgical Instructions that were given to them at this PAT appointment. Patient was also educated of the need to review these PAT instructions again prior to his/her surgery.I reviewed the appropriate phone numbers to call if they have any and questions or concerns.

## 2024-06-15 NOTE — Patient Instructions (Addendum)
 SURGICAL WAITING ROOM VISITATION  Patients having surgery or a procedure may have no more than 2 support people in the waiting area - these visitors may rotate.    Children under the age of 52 must have an adult with them who is not the patient.  Visitors with respiratory illnesses are discouraged from visiting and should remain at home.  If the patient needs to stay at the hospital during part of their recovery, the visitor guidelines for inpatient rooms apply. Pre-op nurse will coordinate an appropriate time for 1 support person to accompany patient in pre-op.  This support person may not rotate.    Please refer to the [facility] website for the visitor guidelines for Inpatients (after your surgery is over and you are in a regular room).       Your procedure is scheduled on: 06/24/24   Report to [facility] Main Entrance    Report to admitting at 7AM   Call this number if you have problems the morning of surgery [phone]   Do not eat food or drink liquids :After Midnight.but may have sips of water  to take meds.    Oral Hygiene is also important to reduce your risk of infection.                                    Remember - BRUSH YOUR TEETH THE MORNING OF SURGERY WITH YOUR REGULAR TOOTHPASTE    Stop all vitamins and herbal supplements 7 days before surgery.   Take these medicines the morning of surgery with A SIP OF WATER : bisoprolol , duloxetine , famotidine , keppra , levothyroxine , pregabalin , rosuvastatin , tamsulosin , tylenol  if needed.  Bring CPAP mask and tubing day of surgery.                              You may not have any metal on your body including hair pins, jewelry, and body piercing             Do not wear make-up, lotions, powders, perfumes/cologne, or deodorant  Do not wear nail polish including gel and S&S, artificial/acrylic nails, or any other type of covering on natural nails including finger and toenails. If you have artificial nails, gel  coating, etc. that needs to be removed by a nail salon please have this removed prior to surgery or surgery may need to be canceled/ delayed if the surgeon/ anesthesia feels like they are unable to be safely monitored.   Do not shave  48 hours prior to surgery.              Do not bring valuables to the hospital. [facility] IS NOT             RESPONSIBLE   FOR VALUABLES.   Contacts, glasses, dentures or bridgework may not be worn into surgery.   Bring small overnight bag day of surgery.   DO NOT BRING YOUR HOME MEDICATIONS TO THE HOSPITAL. PHARMACY WILL DISPENSE MEDICATIONS LISTED ON YOUR MEDICATION LIST TO YOU DURING YOUR ADMISSION IN THE HOSPITAL!    Patients discharged on the day of surgery will not be allowed to drive home.  Someone NEEDS to stay with you for the first 24 hours after anesthesia.   Special Instructions: Bring a copy of your healthcare power of attorney and living will documents the day of surgery if you haven't scanned  them before.              Please read over the following fact sheets you were given: IF YOU HAVE QUESTIONS ABOUT YOUR PRE-OP INSTRUCTIONS PLEASE CALL [phone] [place]   If you received a COVID test during your pre-op visit  it is requested that you wear a mask when out in public, stay away from anyone that may not be feeling well and notify your surgeon if you develop symptoms. If you test positive for Covid or have been in contact with anyone that has tested positive in the last 10 days please notify you surgeon.     - Preparing for Surgery Before surgery, you can play an important role.  Because skin is not sterile, your skin needs to be as free of germs as possible.  You can reduce the number of germs on your skin by washing with CHG (chlorahexidine gluconate) soap before surgery.  CHG is an antiseptic cleaner which kills germs and bonds with the skin to continue killing germs even after washing. Please DO NOT use if you have an allergy to  CHG or antibacterial soaps.  If your skin becomes reddened/irritated stop using the CHG and inform your nurse when you arrive at Short Stay. Do not shave (including legs and underarms) for at least 48 hours prior to the first CHG shower.  You may shave your face/neck.  Please follow these instructions carefully:  1.  Shower with CHG Soap the night before surgery and the morning of surgery.  2.  If you choose to wash your hair, wash your hair first as usual with your normal  shampoo.  3.  After you shampoo, rinse your hair and body thoroughly to remove the shampoo.                             4.  Use CHG as you would any other liquid soap.  You can apply chg directly to the skin and wash.  Gently with a scrungie or clean washcloth.  5.  Apply the CHG Soap to your body ONLY FROM THE NECK DOWN.   Do   not use on face/ open                           Wound or open sores. Avoid contact with eyes, ears mouth and   genitals (private parts).                       Wash face,  Genitals (private parts) with your normal soap.             6.  Wash thoroughly, paying special attention to the area where your    surgery  will be performed.  7.  Thoroughly rinse your body with warm water  from the neck down.  8.  DO NOT shower/wash with your normal soap after using and rinsing off the CHG Soap.                9.  Pat yourself dry with a clean towel.            10.  Wear clean pajamas.            11.  Place clean sheets on your bed the night of your first shower and do not  sleep with pets. Day of Surgery : Do not apply any  CHG, lotions/deodorants the morning of surgery.  Please wear clean clothes to the hospital/surgery center.  FAILURE TO FOLLOW THESE INSTRUCTIONS MAY RESULT IN THE CANCELLATION OF YOUR SURGERY  PATIENT SIGNATURE_________________________________  NURSE SIGNATURE__________________________________  ________________________________________________________________________

## 2024-06-17 ENCOUNTER — Encounter (HOSPITAL_COMMUNITY)
Admission: RE | Admit: 2024-06-17 | Discharge: 2024-06-17 | Disposition: A | Source: Ambulatory Visit | Attending: Anesthesiology

## 2024-06-17 ENCOUNTER — Encounter (HOSPITAL_COMMUNITY): Payer: Self-pay

## 2024-06-17 ENCOUNTER — Encounter (HOSPITAL_COMMUNITY): Payer: Self-pay | Admitting: Surgery

## 2024-06-17 DIAGNOSIS — I10 Essential (primary) hypertension: Secondary | ICD-10-CM

## 2024-06-17 DIAGNOSIS — Z01818 Encounter for other preprocedural examination: Secondary | ICD-10-CM

## 2024-07-07 NOTE — Patient Instructions (Signed)
 SURGICAL WAITING ROOM VISITATION  Patients having surgery or a procedure may have no more than 2 support people in the waiting area - these visitors may rotate.    Children ages 84 and under will not be able to visit patients in [facility] under most circumstances.   Visitors with respiratory illnesses are discouraged from visiting and should remain at home.  If the patient needs to stay at the hospital during part of their recovery, the visitor guidelines for inpatient rooms apply. Pre-op nurse will coordinate an appropriate time for 1 support person to accompany patient in pre-op.  This support person may not rotate.    Please refer to the [facility] website for the visitor guidelines for Inpatients (after your surgery is over and you are in a regular room).    Your procedure is scheduled on: 07/20/24   Report to [facility] Main Entrance    Report to admitting at 6:45 AM   Call this number if you have problems the morning of surgery [phone]   Do not eat food or drink liquids :After Midnight.          If you have questions, please contact your surgeons office.   FOLLOW BOWEL PREP AND ANY ADDITIONAL PRE OP INSTRUCTIONS YOU RECEIVED FROM YOUR SURGEON'S OFFICE!!!     Oral Hygiene is also important to reduce your risk of infection.                                    Remember - BRUSH YOUR TEETH THE MORNING OF SURGERY WITH YOUR REGULAR TOOTHPASTE  DENTURES WILL BE REMOVED PRIOR TO SURGERY PLEASE DO NOT APPLY Poly grip OR ADHESIVES!!!   Do NOT smoke after Midnight   Stop all vitamins and herbal supplements 7 days before surgery.   Take these medicines the morning of surgery with A SIP OF WATER : Bisoprolol , Pepcid , Keppra , Levothyroxine , Lyrica    DO NOT TAKE ANY ORAL DIABETIC MEDICATIONS DAY OF YOUR SURGERY  Bring CPAP mask and tubing day of surgery.                              You may not have any metal on your body including hair pins, jewelry, and  body piercing             Do not wear make-up, lotions, powders, perfumes, or deodorant  Do not wear nail polish including gel and S&S, artificial/acrylic nails, or any other type of covering on natural nails including finger and toenails. If you have artificial nails, gel coating, etc. that needs to be removed by a nail salon please have this removed prior to surgery or surgery may need to be canceled/ delayed if the surgeon/ anesthesia feels like they are unable to be safely monitored.   Do not shave  48 hours prior to surgery.    Do not bring valuables to the hospital. [place] IS NOT             RESPONSIBLE   FOR VALUABLES.   Contacts, glasses, dentures or bridgework may not be worn into surgery.   Bring small overnight bag day of surgery.   DO NOT BRING YOUR HOME MEDICATIONS TO THE HOSPITAL. PHARMACY WILL DISPENSE MEDICATIONS LISTED ON YOUR MEDICATION LIST TO YOU DURING YOUR ADMISSION IN THE HOSPITAL!    Special Instructions: Bring a copy  of your healthcare power of attorney and living will documents the day of surgery if you haven't scanned them before.              Please read over the following fact sheets you were given: IF YOU HAVE QUESTIONS ABOUT YOUR PRE-OP INSTRUCTIONS PLEASE CALL [phone] [place].   If you received a COVID test during your pre-op visit  it is requested that you wear a mask when out in public, stay away from anyone that may not be feeling well and notify your surgeon if you develop symptoms. If you test positive for Covid or have been in contact with anyone that has tested positive in the last 10 days please notify you surgeon.    [facility] [facility] - Preparing for Surgery Before surgery, you can play an important role.  Because skin is not sterile, your skin needs to be as free of germs as possible.  You can reduce the number of germs on your skin by washing with CHG (chlorahexidine gluconate) soap before surgery.  CHG is an antiseptic cleaner which kills  germs and bonds with the skin to continue killing germs even after washing. Please DO NOT use if you have an allergy to CHG or antibacterial soaps.  If your skin becomes reddened/irritated stop using the CHG and inform your nurse when you arrive at Short Stay. Do not shave (including legs and underarms) for at least 48 hours prior to the first CHG shower.  You may shave your face/neck.  Please follow these instructions carefully:  1.  Shower with CHG Soap the night before surgery ONLY (DO NOT USE THE SOAP THE MORNING OF SURGERY).  2.  If you choose to wash your hair, wash your hair first as usual with your normal  shampoo.  3.  After you shampoo, rinse your hair and body thoroughly to remove the shampoo.                             4.  Use CHG as you would any other liquid soap.  You can apply chg directly to the skin and wash.  Gently with a scrungie or clean washcloth.  5.  Apply the CHG Soap to your body ONLY FROM THE NECK DOWN.   Do   not use on face/ open                           Wound or open sores. Avoid contact with eyes, ears mouth and   genitals (private parts).                       Wash face,  Genitals (private parts) with your normal soap.             6.  Wash thoroughly, paying special attention to the area where your    surgery  will be performed.  7.  Thoroughly rinse your body with warm water  from the neck down.  8.  DO NOT shower/wash with your normal soap after using and rinsing off the CHG Soap.                9.  Pat yourself dry with a clean towel.            10.  Wear clean pajamas.            11.  Place clean sheets on  your bed the night of your first shower and do not  sleep with pets. Day of Surgery : Do not apply any CHG, lotions/deodorants the morning of surgery.  Please wear clean clothes to the hospital/surgery center.  FAILURE TO FOLLOW THESE INSTRUCTIONS MAY RESULT IN THE CANCELLATION OF YOUR SURGERY  PATIENT SIGNATURE_________________________________  NURSE  SIGNATURE__________________________________  ________________________________________________________________________

## 2024-07-07 NOTE — Progress Notes (Addendum)
 Date of COVID positive in last 90 days:  PCP - Montie Pizza, MD Cardiologist - Oneil Parchment, MD LOV 01/10/21 for tachycardia  Chest x-ray - 04/30/24 Epic EKG - 04/30/24 Epic Stress Test - N/A ECHO - 05/02/24 Epic Cardiac Cath - N/A Pacemaker/ICD device last checked:N/A Spinal Cord Stimulator:N/A  Bowel Prep - N/A  Sleep Study - N/A CPAP -   Fasting Blood Sugar - N/A Checks Blood Sugar _____ times a day  Last dose of GLP1 agonist-  N/A GLP1 instructions:  Do not take after     Last dose of SGLT-2 inhibitors-  N/A SGLT-2 instructions:  Do not take after     Blood Thinner Instructions: Eliquis  Aspirin Instructions:N/A Last Dose:  Activity level:  Can go up a flight of stairs and perform activities of daily living without stopping and without symptoms of chest pain or shortness of breath.  Able to exercise without symptoms  Unable to go up a flight of stairs without symptoms of     Anesthesia review: Seizures, HTN, mild OSA  Patient denies shortness of breath, fever, cough and chest pain at PAT appointment  Patient verbalized understanding of instructions that were given to them at the PAT appointment. Patient was also instructed that they will need to review over the PAT instructions again at home before surgery.

## 2024-07-13 ENCOUNTER — Encounter (HOSPITAL_COMMUNITY)
Admission: RE | Admit: 2024-07-13 | Discharge: 2024-07-13 | Disposition: A | Discharge status: Home / Self Care | Source: Ambulatory Visit | Attending: Family Medicine | Admitting: Family Medicine

## 2024-07-13 ENCOUNTER — Encounter (HOSPITAL_COMMUNITY): Payer: Self-pay

## 2024-07-13 NOTE — Progress Notes (Signed)
 Called patient multiple times and left voicemail regarding PST appointment. No answer or response from patient . Scheduler aware

## 2024-07-16 ENCOUNTER — Encounter (HOSPITAL_COMMUNITY): Payer: Self-pay

## 2024-08-16 NOTE — Progress Notes (Signed)
 Anesthesia Review:  Montie white- preop eval on 07/29/24  ERE:Rbwuypj White - preop eval on 07/29/2024  Cardiologist : DR Oneil Parchment  LVO 01/10/21 with Glendia lelon BARRE   PPM/ ICD: Device Orders: Rep Notified:  Chest x-ray : 04/30/24- 1 view  EKG : 04/30/24  Monitor- 05/02/24  Echo : Stress test: Cardiac Cath :   Activity level: cannot do a flight of steps without difficutly  Sleep Study/ CPAP : none  Fasting Blood Sugar :      / Checks Blood Sugar -- times a day:     Prediabetes- on no meds    Eliquis  - last dose on 08/14/24   Blood Thinner/ Instructions /Last Dose: ASA / Instructions/ Last Dose :     Colostomy in place Back brace  PT to preop appt accompanied by father.  PT and family with flu 3 weeks ago.  PT with no symptoms at time of preop appt.  Pt's husband is current in hospital at [facility] with pneumonia and uti.     Blood drawn at preop on 08/17/24.  Blood taken to  main lab by preop venipuncturist.  Lab  finally located tubes of blood and processed.     CBC done 08/17/24 routed to DR  [facility] on 08/17/24.

## 2024-08-16 NOTE — Patient Instructions (Signed)
 SURGICAL WAITING ROOM VISITATION  Patients having surgery or a procedure may have no more than 2 support people in the waiting area - these visitors may rotate.    Children ages 60 and under will not be able to visit patients in [facility] under most circumstances.   Visitors with respiratory illnesses are discouraged from visiting and should remain at home.  If the patient needs to stay at the hospital during part of their recovery, the visitor guidelines for inpatient rooms apply. Pre-op nurse will coordinate an appropriate time for 1 support person to accompany patient in pre-op.  This support person may not rotate.    Please refer to the [facility] website for the visitor guidelines for Inpatients (after your surgery is over and you are in a regular room).       Your procedure is scheduled on:  08/24/2024    Report to [facility] Main Entrance    Report to admitting at   0900 AM   Call this number if you have problems the morning of surgery [phone]   Do not eat food  or drink liquids :After Midnight.                 If you have questions, please contact your surgeons office.      Oral Hygiene is also important to reduce your risk of infection.                                    Remember - BRUSH YOUR TEETH THE MORNING OF SURGERY WITH YOUR REGULAR TOOTHPASTE  DENTURES WILL BE REMOVED PRIOR TO SURGERY PLEASE DO NOT APPLY Poly grip OR ADHESIVES!!!   Do NOT smoke after Midnight   Stop all vitamins and herbal supplements 7 days before surgery.   Take these medicines the morning of surgery with A SIP OF WATER :  bisoprolol , pepcid  if needed, keppra , synthroid , flomax , lyrica , nucynta , magnesium    DO NOT TAKE ANY ORAL DIABETIC MEDICATIONS DAY OF YOUR SURGERY  Bring CPAP mask and tubing day of surgery.                              You may not have any metal on your body including hair pins, jewelry, and body piercing             Do not wear  make-up, lotions, powders, perfumes/cologne, or deodorant  Do not wear nail polish including gel and S&S, artificial/acrylic nails, or any other type of covering on natural nails including finger and toenails. If you have artificial nails, gel coating, etc. that needs to be removed by a nail salon please have this removed prior to surgery or surgery may need to be canceled/ delayed if the surgeon/ anesthesia feels like they are unable to be safely monitored.   Do not shave  48 hours prior to surgery.               Men may shave face and neck.   Do not bring valuables to the hospital. [place] IS NOT             RESPONSIBLE   FOR VALUABLES.   Contacts, glasses, dentures or bridgework may not be worn into surgery.   Bring small overnight bag day of surgery.   DO NOT BRING YOUR HOME MEDICATIONS TO THE HOSPITAL. PHARMACY  WILL DISPENSE MEDICATIONS LISTED ON YOUR MEDICATION LIST TO YOU DURING YOUR ADMISSION IN THE HOSPITAL!    Patients discharged on the day of surgery will not be allowed to drive home.  Someone NEEDS to stay with you for the first 24 hours after anesthesia.   Special Instructions: Bring a copy of your healthcare power of attorney and living will documents the day of surgery if you haven't scanned them before.              Please read over the following fact sheets you were given: IF YOU HAVE QUESTIONS ABOUT YOUR PRE-OP INSTRUCTIONS PLEASE CALL [phone].   If you received a COVID test during your pre-op visit  it is requested that you wear a mask when out in public, stay away from anyone that may not be feeling well and notify your surgeon if you develop symptoms. If you test positive for Covid or have been in contact with anyone that has tested positive in the last 10 days please notify you surgeon.     - Preparing for Surgery Before surgery, you can play an important role.  Because skin is not sterile, your skin needs to be as free of germs as possible.  You can  reduce the number of germs on your skin by washing with CHG (chlorahexidine gluconate) soap before surgery.  CHG is an antiseptic cleaner which kills germs and bonds with the skin to continue killing germs even after washing. Please DO NOT use if you have an allergy to CHG or antibacterial soaps.  If your skin becomes reddened/irritated stop using the CHG and inform your nurse when you arrive at Short Stay. Do not shave (including legs and underarms) for at least 48 hours prior to the first CHG shower.  You may shave your face/neck. Please follow these instructions carefully:  1.  Shower with CHG Soap the night before surgery and the  morning of Surgery.  2.  If you choose to wash your hair, wash your hair first as usual with your  normal  shampoo.  3.  After you shampoo, rinse your hair and body thoroughly to remove the  shampoo.                           4.  Use CHG as you would any other liquid soap.  You can apply chg directly  to the skin and wash                       Gently with a scrungie or clean washcloth.  5.  Apply the CHG Soap to your body ONLY FROM THE NECK DOWN.   Do not use on face/ open                           Wound or open sores. Avoid contact with eyes, ears mouth and genitals (private parts).                       Wash face,  Genitals (private parts) with your normal soap.             6.  Wash thoroughly, paying special attention to the area where your surgery  will be performed.  7.  Thoroughly rinse your body with warm water  from the neck down.  8.  DO NOT shower/wash with your normal soap after using  and rinsing off  the CHG Soap.                9.  Pat yourself dry with a clean towel.            10.  Wear clean pajamas.            11.  Place clean sheets on your bed the night of your first shower and do not  sleep with pets. Day of Surgery : Do not apply any lotions/deodorants the morning of surgery.  Please wear clean clothes to the hospital/surgery center.  FAILURE TO  FOLLOW THESE INSTRUCTIONS MAY RESULT IN THE CANCELLATION OF YOUR SURGERY PATIENT SIGNATURE_________________________________  NURSE SIGNATURE__________________________________  ________________________________________________________________________

## 2024-08-17 ENCOUNTER — Encounter (HOSPITAL_COMMUNITY): Payer: Self-pay

## 2024-08-17 ENCOUNTER — Encounter (HOSPITAL_COMMUNITY)
Admission: RE | Admit: 2024-08-17 | Discharge: 2024-08-17 | Disposition: A | Source: Ambulatory Visit | Attending: Urology | Admitting: Urology

## 2024-08-17 ENCOUNTER — Other Ambulatory Visit: Payer: Self-pay

## 2024-08-17 DIAGNOSIS — K219 Gastro-esophageal reflux disease without esophagitis: Secondary | ICD-10-CM | POA: Insufficient documentation

## 2024-08-17 DIAGNOSIS — K449 Diaphragmatic hernia without obstruction or gangrene: Secondary | ICD-10-CM | POA: Insufficient documentation

## 2024-08-17 DIAGNOSIS — Z79890 Hormone replacement therapy (postmenopausal): Secondary | ICD-10-CM | POA: Insufficient documentation

## 2024-08-17 DIAGNOSIS — Z85048 Personal history of other malignant neoplasm of rectum, rectosigmoid junction, and anus: Secondary | ICD-10-CM | POA: Insufficient documentation

## 2024-08-17 DIAGNOSIS — Z01812 Encounter for preprocedural laboratory examination: Secondary | ICD-10-CM | POA: Insufficient documentation

## 2024-08-17 DIAGNOSIS — Z923 Personal history of irradiation: Secondary | ICD-10-CM | POA: Insufficient documentation

## 2024-08-17 DIAGNOSIS — I129 Hypertensive chronic kidney disease with stage 1 through stage 4 chronic kidney disease, or unspecified chronic kidney disease: Secondary | ICD-10-CM | POA: Insufficient documentation

## 2024-08-17 DIAGNOSIS — Z9221 Personal history of antineoplastic chemotherapy: Secondary | ICD-10-CM | POA: Insufficient documentation

## 2024-08-17 DIAGNOSIS — Z933 Colostomy status: Secondary | ICD-10-CM | POA: Insufficient documentation

## 2024-08-17 DIAGNOSIS — F32A Depression, unspecified: Secondary | ICD-10-CM | POA: Insufficient documentation

## 2024-08-17 DIAGNOSIS — N131 Hydronephrosis with ureteral stricture, not elsewhere classified: Secondary | ICD-10-CM | POA: Insufficient documentation

## 2024-08-17 DIAGNOSIS — Z87891 Personal history of nicotine dependence: Secondary | ICD-10-CM | POA: Insufficient documentation

## 2024-08-17 DIAGNOSIS — Z01818 Encounter for other preprocedural examination: Secondary | ICD-10-CM

## 2024-08-17 DIAGNOSIS — G620 Drug-induced polyneuropathy: Secondary | ICD-10-CM | POA: Insufficient documentation

## 2024-08-17 DIAGNOSIS — T451X5A Adverse effect of antineoplastic and immunosuppressive drugs, initial encounter: Secondary | ICD-10-CM | POA: Insufficient documentation

## 2024-08-17 DIAGNOSIS — D631 Anemia in chronic kidney disease: Secondary | ICD-10-CM | POA: Insufficient documentation

## 2024-08-17 DIAGNOSIS — N189 Chronic kidney disease, unspecified: Secondary | ICD-10-CM | POA: Insufficient documentation

## 2024-08-17 DIAGNOSIS — G4733 Obstructive sleep apnea (adult) (pediatric): Secondary | ICD-10-CM | POA: Insufficient documentation

## 2024-08-17 DIAGNOSIS — G894 Chronic pain syndrome: Secondary | ICD-10-CM | POA: Insufficient documentation

## 2024-08-17 DIAGNOSIS — I10 Essential (primary) hypertension: Secondary | ICD-10-CM

## 2024-08-17 DIAGNOSIS — F1120 Opioid dependence, uncomplicated: Secondary | ICD-10-CM | POA: Insufficient documentation

## 2024-08-17 DIAGNOSIS — Z86718 Personal history of other venous thrombosis and embolism: Secondary | ICD-10-CM | POA: Insufficient documentation

## 2024-08-17 DIAGNOSIS — F419 Anxiety disorder, unspecified: Secondary | ICD-10-CM | POA: Insufficient documentation

## 2024-08-17 DIAGNOSIS — E039 Hypothyroidism, unspecified: Secondary | ICD-10-CM | POA: Insufficient documentation

## 2024-08-17 DIAGNOSIS — Z7901 Long term (current) use of anticoagulants: Secondary | ICD-10-CM | POA: Insufficient documentation

## 2024-08-17 DIAGNOSIS — Z981 Arthrodesis status: Secondary | ICD-10-CM | POA: Insufficient documentation

## 2024-08-17 HISTORY — DX: Chronic kidney disease, unspecified: N18.9

## 2024-08-17 HISTORY — DX: Dyspnea, unspecified: R06.00

## 2024-08-17 HISTORY — DX: Prediabetes: R73.03

## 2024-08-17 HISTORY — DX: Hypothyroidism, unspecified: E03.9

## 2024-08-17 LAB — CBC
HCT: 39.1 % (ref 36.0–46.0)
Hemoglobin: 11.7 g/dL — ABNORMAL LOW (ref 12.0–15.0)
MCH: 24.8 pg — ABNORMAL LOW (ref 26.0–34.0)
MCHC: 29.9 g/dL — ABNORMAL LOW (ref 30.0–36.0)
MCV: 83 fL (ref 80.0–100.0)
Platelets: 347 10*3/uL (ref 150–400)
RBC: 4.71 MIL/uL (ref 3.87–5.11)
RDW: 17.7 % — ABNORMAL HIGH (ref 11.5–15.5)
WBC: 11.1 10*3/uL — ABNORMAL HIGH (ref 4.0–10.5)
nRBC: 0 % (ref 0.0–0.2)

## 2024-08-17 LAB — BASIC METABOLIC PANEL WITH GFR
Anion gap: 19 — ABNORMAL HIGH (ref 5–15)
BUN: 12 mg/dL (ref 6–20)
CO2: 17 mmol/L — ABNORMAL LOW (ref 22–32)
Calcium: 10.2 mg/dL (ref 8.9–10.3)
Chloride: 100 mmol/L (ref 98–111)
Creatinine, Ser: 1.27 mg/dL — ABNORMAL HIGH (ref 0.44–1.00)
GFR, Estimated: 49 mL/min — ABNORMAL LOW
Glucose, Bld: 158 mg/dL — ABNORMAL HIGH (ref 70–99)
Potassium: 3.9 mmol/L (ref 3.5–5.1)
Sodium: 135 mmol/L (ref 135–145)

## 2024-08-18 NOTE — Anesthesia Preprocedure Evaluation (Signed)
"                                    Anesthesia Evaluation    Airway        Dental   Pulmonary former smoker          Cardiovascular hypertension,      Neuro/Psych    GI/Hepatic   Endo/Other    Renal/GU      Musculoskeletal   Abdominal   Peds  Hematology   Anesthesia Other Findings   Reproductive/Obstetrics                              Anesthesia Physical Anesthesia Plan  ASA:   Anesthesia Plan:    Post-op Pain Management:    Induction:   PONV Risk Score and Plan:   Airway Management Planned:   Additional Equipment:   Intra-op Plan:   Post-operative Plan:   Informed Consent:   Plan Discussed with:   Anesthesia Plan Comments: (See PAT note from 2/3)         Anesthesia Quick Evaluation  "

## 2024-08-18 NOTE — Progress Notes (Signed)
 " Case: [id number] Date/Time: 08/24/24 1045   Procedures:      CYSTOSCOPY, FLEXIBLE, WITH STENT REPLACEMENT (Bilateral) - CYSTOSCOPY WITH BILATERAL STENT EXCHANGE     CYSTOSCOPY, WITH RETROGRADE PYELOGRAM (Bilateral)   Anesthesia type: General   Diagnosis: Other hydronephrosis [N13.39]   Pre-op diagnosis: OTHER HYDRONEPHROSIS   Location: [facility]   Surgeons: Elisabeth Valli BIRCH, MD       DISCUSSION: Lindsay Little is a 59 yo female with PMH of former smoking, PONV, HTN, hx of DVT (dx 03/2024), mild OSA no CPAP, rectal cancer s/p LAR and chemo/radiation (2017), rectovaginal fistula, Chemo induced neuropathy, Colostomy in place, GERD, hiatal hernia, hypothyroid, CKD, anemia, anxiety, depression, TMJ d/o, lumbar fusion L4-5 (2019), arthritis, chronic pain with narcotic dependence, chronic obstructive uropathy requiring bilateral ureteral stents for bilateral hydronephrosis  Admitted from 10/17-10/20/25 for N/V/D. Also with chronic bilateral hydronphrosis and acute cystitis/pyelonephritis. It was previously recommended she possibly undergo bilateral percutaneous nephrostomy tubes however shared decision making was made to try stent exchange first which was done on 05/01/24 and was without complications. Pre op labs show stable CKD. Now scheduled for repeat stent exchange.  Hx of stage 3 rectal cancer s/p LAR, chemo, radiation in 2017. Has a colostomy. Last seen by Oncology in 02/2022 by Dr. Lanny for surveillance. PET scan at that time suspicious for disease recurrence however she underwent biopsy and it was negative. Has not any further follow up.  Hx of chronic pain syndrome and is narcotic dependent. Follows in pain clinic. She is on Nucynta , Dilaudid , Lyrica , Keppra  for pain. There is a diagnosis of seizures in several provider notes however pt does not follow with neurology and Keppra  appears to be prescribed by pain provider.  Seen by PCP on 07/29/24 for pre op clearance. Clinically  stable. Per Dr. Teresa: Pre-op evaluation Clinical Notes: When she is off Eliquis , she is cleared for surgery.    LD Eliquis  1/31  VS: BP (!) 145/91   Pulse (!) 116   Temp 37.2 C (Oral)   Resp 16   Ht 5' 10.5 (1.791 m)   Wt 124.7 kg   SpO2 96%   BMI 38.90 kg/m    PROVIDERS: Teresa Channel, MD   LABS: Labs reviewed: Acceptable for surgery. (all labs ordered are listed, but only abnormal results are displayed)  Labs Reviewed  BASIC METABOLIC PANEL WITH GFR - Abnormal; Notable for the following components:      Result Value   CO2 17 (*)    Glucose, Bld 158 (*)    Creatinine, Ser 1.27 (*)    GFR, Estimated 49 (*)    Anion gap 19 (*)    All other components within normal limits  CBC - Abnormal; Notable for the following components:   WBC 11.1 (*)    Hemoglobin 11.7 (*)    MCH 24.8 (*)    MCHC 29.9 (*)    RDW 17.7 (*)    All other components within normal limits    CT Abdomen/Pelvis 04/30/24:   IMPRESSION: 1. Moderate bilateral hydronephrosis with bilateral ureteral stents and associated urothelial thickening and periureteral stranding, overall unchanged from prior study. 2. Presacral heterogeneous collection or mass with mild adjacent stranding and free fluid, slightly decreased in size from prior study. 3. Right iliac lymph node mildly enlarged to 12 mm from 10 mm.   Echo 05/02/24:  IMPRESSIONS    1. Left ventricular ejection fraction, by estimation, is 60 to 65%. The left ventricle has  normal function. The left ventricle has no regional wall motion abnormalities. Left ventricular diastolic parameters were normal.  2. Right ventricular systolic function is normal. The right ventricular size is normal.  3. The mitral valve is abnormal. No evidence of mitral valve regurgitation. No evidence of mitral stenosis.  4. The aortic valve is tricuspid. There is mild calcification of the aortic valve. There is mild thickening of the aortic valve. Aortic  valve regurgitation is not visualized. Aortic valve sclerosis is present, with no evidence of aortic valve stenosis.  5. The inferior vena cava is normal in size with greater than 50% respiratory variability, suggesting right atrial pressure of 3 mmHg.  Past Medical History:  Diagnosis Date   Anxiety    Chemotherapy-induced neuropathy    toes and fingers numbness and tingling   Chronic kidney disease    Colorectal delayed anastomotic leak s/p resection & colostomy 07/12/2016 11/17/2015   Colostomy in place [facility]) 07/12/2016   due to anastomosis breakdown w/ colovaginal fistula   Colovaginal fistula s/p omentopexy repair 07/12/2016 07/10/2016   Complication of anesthesia    Depression    Dyspnea    Family history of adverse reaction to anesthesia    mother-- ponv   Family history of breast cancer    Family history of pancreatic cancer    Family history of skin cancer    Gastroenteritis 12/20/2015   GERD (gastroesophageal reflux disease)    Hardware complicating wound infection 08/03/2019   Hiatal hernia    History of cancer chemotherapy 02-20-2015 to 03-30-2015   History of cardiac murmur as a child    History of chemotherapy    History of chronic gastritis    History of hypertension    no issues since multiple abdminal sx's and chemo--- no medication since 12/ 2016   History of TMJ disorder    Hypercholesteremia    Hypertension    Hypothyroidism    IBS (irritable bowel syndrome)    dx age 46   Intermittent abdominal pain    post-op multiple abdominal sx's    Microcytic anemia    Mild sleep apnea    per pt study 2014  very mild osa , no cpap recommended, recommeded wt loss and sleep routine   Obesity    Parastomal hernia s/p lap repair w mesh 07/23/2018 07/23/2018   Pelvic abscess s/p drainage & omental pedicle flap 11/17/2015 06/23/2015   PONV (postoperative nausea and vomiting)    severe needs Scopolamine  PATCH    Portacath in place    right chest   Pre-diabetes     Rectal adenocarcinoma (HCC) oncologis-  dr lanny-- after radiation/ chemo (ypT1, N0) --  no recurrence per last note 03/ 2018   dx 01-13-2015-- Stage IIIC (T3, N2, M0) post concurrent radiation and chemotherapy 02-20-2015 to 03-30-2015 /  05-25-2015 s/p  LAR w/ RSO (post-op complicated by late abscess and contained anatomotic leak w/ help percutaneous drainage and antibiotics)    Rectocutaneous fistula 07/10/2016   Rotator cuff tear, left    S/P radiation therapy 02/20/15-03/30/15   colon/rectal   Vitamin D  deficiency    Wears glasses    Wears glasses     Past Surgical History:  Procedure Laterality Date   ABDOMINAL HYSTERECTOMY  1996   uterus and cervix   BACK SURGERY  02/12/2018   lumbar surgery   COLON RESECTION N/A 07/12/2016   Procedure: LAPAROSCOPIC LYSIS OF ADHESIONS, OMENTOPEXY, HAND ASSISTED RESECTION OF  COLON, END TO END ANASTOMOSIS, COLOSTOMY;  Surgeon:  Elspeth Schultze, MD;  Location: [facility];  Service: General;  Laterality: N/A;   COMBINED HYSTEROSCOPY DIAGNOSTIC / D&C  x2 1990's   CYSTOSCOPY W/ URETERAL STENT PLACEMENT Bilateral 02/16/2023   Procedure: CYSTOSCOPY WITH RETROGRADE PYELOGRAM BILATERAL STENT EXCHANGE;  Surgeon: Shona Layman BROCKS, MD;  Location: [facility];  Service: Urology;  Laterality: Bilateral;   CYSTOSCOPY W/ URETERAL STENT PLACEMENT Bilateral 05/01/2024   Procedure: CYSTOSCOPY, WITH RETROGRADE PYELOGRAM AND URETERAL STENT INSERTION;  Surgeon: Elisabeth Valli BIRCH, MD;  Location: [facility];  Service: Urology;  Laterality: Bilateral;   CYSTOSCOPY WITH STENT PLACEMENT Bilateral 05/28/2023   Procedure: CYSTOSCOPY, BILATERAL RETROGRADE PYELOGRAM, BILATERAL STENT EXCHANGE;  Surgeon: Carolee Sherwood BIRCH DOUGLAS, MD;  Location: [facility];  Service: Urology;  Laterality: Bilateral;   DIAGNOSTIC LAPAROSCOPY  age 7 and age 29   EUS N/A 01/18/2015   Procedure: LOWER ENDOSCOPIC ULTRASOUND (EUS);  Surgeon: Elsie Cree, MD;  Location: [facility];  Service: Endoscopy;  Laterality: N/A;   EXCISION OF  SKIN TAG  11/17/2015   Procedure: EXCISION OF SKIN TAG;  Surgeon: Elspeth Schultze, MD;  [facility];  Service: General;;   ILEO LOOP COLOSTOMY CLOSURE N/A 11/17/2015   Procedure: LAPAROSCOPIC DIVERTING LOOP ILEOSTOMY  DRAINAGE OF PELVIC ABSCESS;  Surgeon: Elspeth Schultze, MD;  [facility];  Service: General;  Laterality: N/A;   ILEOSTOMY CLOSURE N/A 05/31/2016   Procedure: TAKEDOWN LOOP ILEOSTOMY;  Surgeon: Elspeth Schultze, MD;  [facility];  Service: General;  Laterality: N/A;   IMPACTION REMOVAL  11/17/2015   Procedure: DISIMPACTION REMOVAL;  Surgeon: Elspeth Schultze, MD;  [facility];  Service: General;;   IR LUMBAR DISC ASPIRATION W/IMG GUIDE  07/01/2019   KNEE ARTHROSCOPY Left 1990's   KNEE ARTHROSCOPY W/ MENISCECTOMY Left 09/14/2009   and chondroplasty debridement   LAPAROSCOPIC LYSIS OF ADHESIONS  11/17/2015   Procedure: LAPAROSCOPIC LYSIS OF ADHESIONS;  Surgeon: Elspeth Schultze, MD;  [facility];  Service: General;;   LUMBAR WOUND DEBRIDEMENT N/A 04/24/2018   Procedure: wound exploration, irrigation and debridement;  Surgeon: Joshua Alm RAMAN, MD;  Location: [facility];  Service: Neurosurgery;  Laterality: N/A;  wound exploration, irrigation and debridement   LYSIS OF ADHESION N/A 07/23/2018   Procedure: LYSIS OF ADHESIONS;  Surgeon: Schultze Elspeth, MD;  Location[facility];  Service: General;  Laterality: N/A;   PORT-A-CATH REMOVAL N/A 11/21/2016   Procedure: REMOVAL PORT-A-CATH;  Surgeon: Schultze Elspeth, MD;  Location: [facility] [place];  Service: General;  Laterality: N/A;   PORTACATH PLACEMENT N/A 05/25/2015   Procedure: INSERTION PORT-A-CATH;  Surgeon: Elspeth Schultze, MD;  Location[facility];  Service: General;  Laterality: N/A;-remains inplace Right chest.   ROTATOR CUFF REPAIR Right 2006   TONSILLECTOMY  age 69   VENTRAL HERNIA REPAIR N/A 07/23/2018   Procedure: LAPAROSCOPIC VENTRAL WALL HERNIA REPAIR;  Surgeon: Schultze Elspeth, MD;  Location: [facility];  Service: General;  Laterality: N/A;    XI ROBOTIC ASSISTED LOWER ANTERIOR RESECTION N/A 05/25/2015   Procedure: XI ROBOTIC ASSISTED LOWER ANTERIOR RESECTION, , RIGID PROCTOSCOPY, RIGHT OOPHORECTOMY;  Surgeon: Elspeth Schultze, MD;  Location: [facility];  Service: General;  Laterality: N/A;    MEDICATIONS:  apixaban  (ELIQUIS ) 5 MG TABS tablet   bisoprolol  (ZEBETA ) 5 MG tablet   DULoxetine  (CYMBALTA ) 60 MG capsule   famotidine  (PEPCID ) 20 MG tablet   furosemide  (LASIX ) 20 MG tablet   HYDROmorphone  (DILAUDID ) 4 MG tablet   hyoscyamine  (LEVSIN ) 0.125 MG tablet   Ibuprofen 200 MG CAPS  levETIRAcetam  (KEPPRA ) 250 MG tablet   levothyroxine  (SYNTHROID ) 50 MCG tablet   magnesium  oxide (MAG-OX) 400 MG tablet   Melatonin 3 MG TABS   naloxone  (NARCAN ) nasal spray 4 mg/0.1 mL   NUCYNTA  ER 150 MG TB12   oxybutynin  (DITROPAN  XL) 15 MG 24 hr tablet   polyethylene glycol (MIRALAX  / GLYCOLAX ) 17 g packet   pregabalin  (LYRICA ) 225 MG capsule   promethazine  (PHENERGAN ) 25 MG tablet   rosuvastatin  (CRESTOR ) 5 MG tablet   tamsulosin  (FLOMAX ) 0.4 MG CAPS capsule   traZODone  (DESYREL ) 50 MG tablet   triamcinolone  lotion (KENALOG ) 0.1 %   TYLENOL  500 MG tablet   No current facility-administered medications for this encounter.   Burnard CHRISTELLA Odis DEVONNA [facility] Short Stay/Anesthesiology [facility] Phone [phone] 08/18/2024 1:04 PM        "

## 2024-08-24 ENCOUNTER — Encounter (HOSPITAL_COMMUNITY): Payer: Self-pay | Admitting: Medical

## 2024-08-24 ENCOUNTER — Encounter (HOSPITAL_COMMUNITY): Admission: RE | Payer: Self-pay | Source: Home / Self Care

## 2024-08-24 ENCOUNTER — Ambulatory Visit (HOSPITAL_COMMUNITY): Admission: RE | Admit: 2024-08-24 | Source: Home / Self Care | Admitting: Urology
